# Patient Record
Sex: Male | Born: 1960
Health system: Southern US, Community
[De-identification: ages and names within clinical notes are randomized; demographics above are authoritative.]

## PROBLEM LIST (undated history)

## (undated) DIAGNOSIS — I10 Essential (primary) hypertension: Secondary | ICD-10-CM

## (undated) DIAGNOSIS — IMO0001 Reserved for inherently not codable concepts without codable children: Secondary | ICD-10-CM

## (undated) DIAGNOSIS — J449 Chronic obstructive pulmonary disease, unspecified: Secondary | ICD-10-CM

## (undated) DIAGNOSIS — J9611 Chronic respiratory failure with hypoxia: Secondary | ICD-10-CM

## (undated) DIAGNOSIS — G4730 Sleep apnea, unspecified: Secondary | ICD-10-CM

## (undated) DIAGNOSIS — J40 Bronchitis, not specified as acute or chronic: Secondary | ICD-10-CM

## (undated) DIAGNOSIS — T884XXA Failed or difficult intubation, initial encounter: Secondary | ICD-10-CM

## (undated) DIAGNOSIS — J45909 Unspecified asthma, uncomplicated: Secondary | ICD-10-CM

## (undated) DIAGNOSIS — E119 Type 2 diabetes mellitus without complications: Secondary | ICD-10-CM

## (undated) HISTORY — DX: Sleep apnea, unspecified: G47.30

## (undated) HISTORY — DX: Bronchitis, not specified as acute or chronic: J40

## (undated) HISTORY — DX: Morbid (severe) obesity due to excess calories: E66.01

---

## 1998-04-11 ENCOUNTER — Emergency Department (HOSPITAL_COMMUNITY): Admission: EM | Admit: 1998-04-11 | Discharge: 1998-04-11 | Payer: Self-pay

## 2007-06-27 ENCOUNTER — Emergency Department (HOSPITAL_COMMUNITY): Admission: EM | Admit: 2007-06-27 | Discharge: 2007-06-27 | Payer: Self-pay | Admitting: Emergency Medicine

## 2007-07-10 ENCOUNTER — Emergency Department (HOSPITAL_COMMUNITY): Admission: EM | Admit: 2007-07-10 | Discharge: 2007-07-10 | Payer: Self-pay | Admitting: Emergency Medicine

## 2013-04-19 ENCOUNTER — Encounter (HOSPITAL_COMMUNITY): Payer: Self-pay | Admitting: Emergency Medicine

## 2013-04-19 ENCOUNTER — Emergency Department (HOSPITAL_COMMUNITY): Payer: Self-pay

## 2013-04-19 ENCOUNTER — Emergency Department (HOSPITAL_COMMUNITY)
Admission: EM | Admit: 2013-04-19 | Discharge: 2013-04-19 | Disposition: A | Discharge status: Home / Self Care | Payer: Self-pay | Attending: Emergency Medicine | Admitting: Emergency Medicine

## 2013-04-19 DIAGNOSIS — I10 Essential (primary) hypertension: Secondary | ICD-10-CM | POA: Insufficient documentation

## 2013-04-19 DIAGNOSIS — F17200 Nicotine dependence, unspecified, uncomplicated: Secondary | ICD-10-CM | POA: Insufficient documentation

## 2013-04-19 DIAGNOSIS — E669 Obesity, unspecified: Secondary | ICD-10-CM

## 2013-04-19 DIAGNOSIS — R609 Edema, unspecified: Secondary | ICD-10-CM | POA: Insufficient documentation

## 2013-04-19 DIAGNOSIS — J45901 Unspecified asthma with (acute) exacerbation: Secondary | ICD-10-CM | POA: Insufficient documentation

## 2013-04-19 DIAGNOSIS — Z79899 Other long term (current) drug therapy: Secondary | ICD-10-CM | POA: Insufficient documentation

## 2013-04-19 DIAGNOSIS — Z7982 Long term (current) use of aspirin: Secondary | ICD-10-CM | POA: Insufficient documentation

## 2013-04-19 HISTORY — DX: Unspecified asthma, uncomplicated: J45.909

## 2013-04-19 LAB — PRO B NATRIURETIC PEPTIDE: Pro B Natriuretic peptide (BNP): 172.8 pg/mL — ABNORMAL HIGH (ref 0–125)

## 2013-04-19 LAB — BASIC METABOLIC PANEL
BUN: 16 mg/dL (ref 6–23)
CHLORIDE: 107 meq/L (ref 96–112)
CO2: 28 mEq/L (ref 19–32)
CREATININE: 1.02 mg/dL (ref 0.50–1.35)
Calcium: 9 mg/dL (ref 8.4–10.5)
GFR, EST NON AFRICAN AMERICAN: 83 mL/min — AB (ref >90)
GLUCOSE: 108 mg/dL — AB (ref 70–99)
POTASSIUM: 3.7 meq/L (ref 3.7–5.3)
Sodium: 146 mEq/L (ref 137–147)

## 2013-04-19 LAB — CBC
HEMATOCRIT: 41.3 % (ref 39.0–52.0)
HEMOGLOBIN: 13 g/dL (ref 13.0–17.0)
MCH: 28.3 pg (ref 26.0–34.0)
MCHC: 31.5 g/dL (ref 30.0–36.0)
MCV: 89.8 fL (ref 78.0–100.0)
Platelets: 262 10*3/uL (ref 150–400)
RBC: 4.6 MIL/uL (ref 4.22–5.81)
RDW: 15.4 % (ref 11.5–15.5)
WBC: 7.1 10*3/uL (ref 4.0–10.5)

## 2013-04-19 LAB — I-STAT TROPONIN, ED: Troponin i, poc: 0.02 ng/mL (ref 0.00–0.08)

## 2013-04-19 MED ORDER — ALBUTEROL SULFATE (2.5 MG/3ML) 0.083% IN NEBU: 2.5000 mg | INHALATION_SOLUTION | RESPIRATORY_TRACT | Status: DC | PRN

## 2013-04-19 MED ORDER — HYDROCHLOROTHIAZIDE 25 MG PO TABS: 25.0000 mg | ORAL_TABLET | Freq: Every day | ORAL | Status: DC

## 2013-04-19 NOTE — ED Notes (Signed)
Pt seen at [facility] and sent here for further eval of SOB.  Pt states he's been SOB "for a minute".  States it has gotten worse today.

## 2013-04-19 NOTE — ED Notes (Signed)
Dr. James at bedside  

## 2013-04-19 NOTE — ED Provider Notes (Addendum)
CSN: [id number]     Arrival date & time 04/19/13  4098 History   First MD Initiated Contact with Patient 04/19/13 1010     Chief Complaint  Patient presents with  . Shortness of Breath     HPI  Patient presents emergency room "because the nurse told me to". [facility]. There is a nurse there intermittently. He saw her today. Apparently he walked in and was dyspneic and painting. He is obese. He has dependent edema. He was encouraged to come to the emergency room. He states he is always short of breath he can only walk "to about the parking lot". He denies chest pain previous had edema in his legs for years. He states he doesn't know what his weight is calm he doesn't know if he has high blood pressure diabetes. He denies any formal diagnosis of heart or lung disease. He does smoke daily.  Past Medical History  Diagnosis Date  . Asthma    History reviewed. No pertinent past surgical history. No family history on file. History  Substance Use Topics  . Smoking status: Current Every Day Smoker  . Smokeless tobacco: Not on file  . Alcohol Use: Yes    Review of Systems  Constitutional: Negative for fever, chills, diaphoresis, appetite change and fatigue.  HENT: Negative for mouth sores, sore throat and trouble swallowing.   Eyes: Negative for visual disturbance.  Respiratory: Positive for shortness of breath. Negative for cough, chest tightness and wheezing.   Cardiovascular: Positive for leg swelling. Negative for chest pain.  Gastrointestinal: Negative for nausea, vomiting, abdominal pain, diarrhea and abdominal distention.  Endocrine: Negative for polydipsia, polyphagia and polyuria.  Genitourinary: Negative for dysuria, frequency and hematuria.  Musculoskeletal: Negative for gait problem.  Skin: Negative for color change, pallor and rash.  Neurological: Negative for dizziness, syncope, light-headedness and headaches.  Hematological: Does  not bruise/bleed easily.  Psychiatric/Behavioral: Negative for behavioral problems and confusion.      Allergies  Review of patient's allergies indicates no known allergies.  Home Medications   Current Outpatient Rx  Name  Route  Sig  Dispense  Refill  . aspirin 325 MG tablet   Oral   Take 1,300 mg by mouth daily as needed for mild pain.         . hydrochlorothiazide (HYDRODIURIL) 25 MG tablet   Oral   Take 1 tablet (25 mg total) by mouth daily.   30 tablet   1    BP 130/72  Pulse 72  Temp(Src) 98.1 F (36.7 C) (Oral)  Resp 31  SpO2 96% Physical Exam  Constitutional: He is oriented to person, place, and time. He appears well-developed and well-nourished. No distress.  Morbidly obese. He estimates  his weight at over 400 pounds.  HENT:  Head: Normocephalic.  Eyes: Conjunctivae are normal. Pupils are equal, round, and reactive to light. No scleral icterus.  Neck: Normal range of motion. Neck supple. No thyromegaly present.  Cardiovascular: Normal rate and regular rhythm.  Exam reveals no gallop and no friction rub.   No murmur heard. No S3 or 4 gallop. No murmur  Pulmonary/Chest: Effort normal and breath sounds normal. No respiratory distress. He has no wheezes. He has no rales.  Masses chest the chest wall. Mildly diminished breath sounds. Mild prolongation. No crackles or rales.  Abdominal: Soft. Bowel sounds are normal. He exhibits no distension. There is no tenderness. There is no rebound.  Musculoskeletal: Normal range  of motion.  Neurological: He is alert and oriented to person, place, and time.  Skin: Skin is warm and dry. No rash noted.  2+ symmetric lower extremity edema.  Psychiatric: He has a normal mood and affect. His behavior is normal.    ED Course  Procedures (including critical care time) Labs Review Labs Reviewed  BASIC METABOLIC PANEL - Abnormal; Notable for the following:    Glucose, Bld 108 (*)    GFR calc non Af Amer 83 (*)    All other  components within normal limits  PRO B NATRIURETIC PEPTIDE - Abnormal; Notable for the following:    Pro B Natriuretic peptide (BNP) 172.8 (*)    All other components within normal limits  CBC  I-STAT TROPOININ, ED   Imaging Review Dg Chest 2 View  04/19/2013   CLINICAL DATA:  Shortness of breath and chest pain.  EXAM: CHEST  2 VIEW  COMPARISON:  None.  FINDINGS: The cardiomediastinal silhouette is unremarkable.  This is a mildly low volume film.  There is no evidence of focal airspace disease, pulmonary edema, suspicious pulmonary nodule/mass, pleural effusion, or pneumothorax. No acute bony abnormalities are identified.  IMPRESSION: No evidence of acute cardiopulmonary disease.   Electronically Signed   By: Hassan Rowan M.D.   On: 04/19/2013 12:05     EKG Interpretation   Date/Time:  Wednesday April 19 2013 09:21:52 EDT Ventricular Rate:  102 PR Interval:  160 QRS Duration: 102 QT Interval:  342 QTC Calculation: 445 R Axis:   56 Text Interpretation:  Sinus tachycardia Incomplete right bundle branch  block Borderline ECG Confirmed by Jeneen Rinks  MD, [place] ([id number]) on 04/19/2013  11:34:30 AM      MDM   Final diagnoses:  Dependent edema  Hypertension  Obesity    Clinically as having congestive heart failure. No crackles, no JVD present dependent edema. Pressures are in the 604V systolic here without intervention. Abnormalities on chest x-ray to suggest failure. Plan:   I discussed with him regarding decreasing his caloric intake and starting a moderate exercise program for weight loss. Hydrochlorothiazide his diuretic for his dependent edema.  Stop Smoking    Tanna Furry, MD 04/19/13 1235  Tanna Furry, MD 04/19/13 [phone]

## 2013-04-19 NOTE — Discharge Instructions (Signed)
Body Mass Index (BMI) This BMI is not intended for use with those under 52 years of age, or pregnant women, or lactating women. To estimate BMI, locate your height, then find your weight in this listing. Your BMI is located to the right of your weight. Height: 58 inches  Weight: 91 lb = BMI 19 (normal)  Weight: 96 lb = BMI 20 (normal)  Weight: 100 lb = BMI 21 (normal)  Weight: 105 lb = BMI 22 (normal)  Weight: 110 lb = BMI 23 (normal)  Weight: 115 lb = BMI 24 (normal)  Weight: 119 lb = BMI 25 (overweight)  Weight: 124 lb = BMI 26 (overweight)  Weight: 129 lb = BMI 27 (overweight)  Weight: 134 lb = BMI 28 (overweight)  Weight: 138 lb = BMI 29 (overweight)  Weight: 143 lb = BMI 30 (obese)  Weight: 148 lb = BMI 31 (obese)  Weight: 153 lb = BMI 32 (obese)  Weight: 158 lb = BMI 33 (obese)  Weight: 162 lb = BMI 34 (obese)  Weight: 167 lb = BMI 35 (obese)  Weight: 172 lb = BMI 36 (obese)  Weight: 177 lb = BMI 37 (obese)  Weight: 181 lb = BMI 38 (obese)  Weight: 186 lb = BMI 39 (obese)  Weight: 191 lb = BMI 40 (extreme obesity)  Weight: 196 lb = BMI 41 (extreme obesity)  Weight: 201 lb = BMI 42 (extreme obesity)  Weight: 205 lb = BMI 43 (extreme obesity)  Weight: 210 lb = BMI 44 (extreme obesity)  Weight: 215 lb = BMI 45 (extreme obesity)  Weight: 220 lb = BMI 46 (extreme obesity)  Weight: 224 lb = BMI 47 (extreme obesity)  Weight: 229 lb = BMI 48 (extreme obesity)  Weight: 234 lb = BMI 49 (extreme obesity)  Weight: 239 lb = BMI 50 (extreme obesity)  Weight: 244 lb = BMI 51 (extreme obesity)  Weight: 248 lb = BMI 52 (extreme obesity)  Weight: 253 lb = BMI 53 (extreme obesity)  Weight: 258 lb = BMI 54 (extreme obesity) Height: 59 inches  Weight: 94 lb = BMI 19 (normal)  Weight: 99 lb = BMI 20 (normal)  Weight: 104 lb = BMI 21 (normal)  Weight: 109 lb = BMI 22 (normal)  Weight: 114 lb = BMI 23 (normal)  Weight: 119 lb = BMI 24  (normal)  Weight: 124 lb = BMI 25 (overweight)  Weight: 128 lb = BMI 26 (overweight)  Weight: 133 lb = BMI 27 (overweight)  Weight: 138 lb = BMI 28 (overweight)  Weight: 143 lb = BMI 29 (overweight)  Weight: 148 lb = BMI 30 (obese)  Weight: 153 lb = BMI 31 (obese)  Weight: 158 lb = BMI 32 (obese)  Weight: 163 lb = BMI 33 (obese)  Weight: 168 lb = BMI 34 (obese)  Weight: 173 lb = BMI 35 (obese)  Weight: 178 lb = BMI 36 (obese)  Weight: 183 lb = BMI 37 (obese)  Weight: 188 lb = BMI 38 (obese)  Weight: 193 lb = BMI 39 (obese)  Weight: 198 lb = BMI 40 (extreme obesity)  Weight: 203 lb = BMI 41 (extreme obesity)  Weight: 208 lb = BMI 42 (extreme obesity)  Weight: 212 lb = BMI 43 (extreme obesity)  Weight: 217 lb = BMI 44 (extreme obesity)  Weight: 222 lb = BMI 45 (extreme obesity)  Weight: 227 lb = BMI 46 (extreme obesity)  Weight: 232 lb = BMI 47 (extreme obesity)  Weight: 237  lb = BMI 48 (extreme obesity)  Weight: 242 lb = BMI 49 (extreme obesity)  Weight: 247 lb = BMI 50 (extreme obesity)  Weight: 252 lb = BMI 51 (extreme obesity)  Weight: 257 lb = BMI 52 (extreme obesity)  Weight: 262 lb = BMI 53 (extreme obesity)  Weight: 267 lb = BMI 54 (extreme obesity) Height: 60 inches  Weight: 97 lb = BMI 19 (normal)  Weight: 102 lb = BMI 20 (normal)  Weight: 107 lb = BMI 21 (normal)  Weight: 112 lb = BMI 22 (normal)  Weight: 118 lb = BMI 23 (normal)  Weight: 123 lb = BMI 24 (normal)  Weight: 128 lb = BMI 25 (overweight)  Weight: 133 lb = BMI 26 (overweight)  Weight: 138 lb = BMI 27 (overweight)  Weight: 143 lb = BMI 28 (overweight)  Weight: 148 lb = BMI 29 (overweight)  Weight: 153 lb = BMI 30 (obese)  Weight: 158 lb = BMI 31 (obese)  Weight: 163 lb = BMI 32 (obese)  Weight: 168 lb = BMI 33 (obese)  Weight: 174 lb = BMI 34 (obese)  Weight: 179 lb = BMI 35 (obese)  Weight: 184 lb = BMI 36 (obese)  Weight: 189 lb = BMI 37  (obese)  Weight: 194 lb = BMI 38 (obese)  Weight: 199 lb = BMI 39 (obese)  Weight: 204 lb = BMI 40 (extreme obesity)  Weight: 209 lb = BMI 41 (extreme obesity)  Weight: 215 lb = BMI 42 (extreme obesity)  Weight: 220 lb = BMI 43 (extreme obesity)  Weight: 225 lb = BMI 44 (extreme obesity)  Weight: 230 lb = BMI 45 (extreme obesity)  Weight: 235 lb = BMI 46 (extreme obesity)  Weight: 240 lb = BMI 47 (extreme obesity)  Weight: 245 lb = BMI 48 (extreme obesity)  Weight: 250 lb = BMI 49 (extreme obesity)  Weight: 255 lb = BMI 50 (extreme obesity)  Weight: 261 lb = BMI 51 (extreme obesity)  Weight: 266 lb = BMI 52 (extreme obesity)  Weight: 271 lb = BMI 53 (extreme obesity)  Weight: 276 lb = BMI 54 (extreme obesity) Height: 61 inches  Weight: 100 lb = BMI 19 (normal)  Weight: 106 lb = BMI 20 (normal)  Weight: 111 lb = BMI 21 (normal)  Weight: 116 lb = BMI 22 (normal)  Weight: 122 lb = BMI 23 (normal)  Weight: 127 lb = BMI 24 (normal)  Weight: 132 lb = BMI 25 (overweight)  Weight: 137 lb = BMI 26 (overweight)  Weight: 143 lb = BMI 27 (overweight)  Weight: 148 lb = BMI 28 (overweight)  Weight: 153 lb = BMI 29 (overweight)  Weight: 158 lb = BMI 30 (obese)  Weight: 164 lb = BMI 31 (obese)  Weight: 169 lb = BMI 32 (obese)  Weight: 174 lb = BMI 33 (obese)  Weight: 180 lb = BMI 34 (obese)  Weight: 185 lb = BMI 35 (obese)  Weight: 190 lb = BMI 36 (obese)  Weight: 195 lb = BMI 37 (obese)  Weight: 201 lb = BMI 38 (obese)  Weight: 206 lb = BMI 39 (obese)  Weight: 211 lb = BMI 40 (extreme obesity)  Weight: 217 lb = BMI 41 (extreme obesity)  Weight: 222 lb = BMI 42 (extreme obesity)  Weight: 227 lb = BMI 43 (extreme obesity)  Weight: 232 lb = BMI 44 (extreme obesity)  Weight: 238 lb = BMI 45 (extreme obesity)  Weight: 243 lb = BMI 46 (extreme  obesity)  Weight: 248 lb = BMI 47 (extreme obesity)  Weight: 254 lb = BMI 48 (extreme  obesity)  Weight: 259 lb = BMI 49 (extreme obesity)  Weight: 264 lb = BMI 50 (extreme obesity)  Weight: 269 lb = BMI 51 (extreme obesity)  Weight: 275 lb = BMI 52 (extreme obesity)  Weight: 280 lb = BMI 53 (extreme obesity)  Weight: 285 lb = BMI 54 (extreme obesity) Height: 62 inches  Weight: 104 lb = BMI 19 (normal)  Weight: 109 lb = BMI 20 (normal)  Weight: 115 lb = BMI 21 (normal)  Weight: 120 lb = BMI 22 (normal)  Weight: 126 lb = BMI 23 (normal)  Weight: 131 lb = BMI 24 (normal)  Weight: 136 lb = BMI 25 (overweight)  Weight: 142 lb = BMI 26 (overweight)  Weight: 147 lb = BMI 27 (overweight)  Weight: 153 lb = BMI 28 (overweight)  Weight: 158 lb = BMI 29 (overweight)  Weight: 164 lb = BMI 30 (obese)  Weight: 169 lb = BMI 31 (obese)  Weight: 175 lb = BMI 32 (obese)  Weight: 180 lb = BMI 33 (obese)  Weight: 186 lb = BMI 34 (obese)  Weight: 191 lb = BMI 35 (obese)  Weight: 196 lb = BMI 36 (obese)  Weight: 202 lb = BMI 37 (obese)  Weight: 207 lb = BMI 38 (obese)  Weight: 213 lb = BMI 39 (obese)  Weight: 218 lb = BMI 40 (extreme obesity)  Weight: 224 lb = BMI 41 (extreme obesity)  Weight: 229 lb = BMI 42 (extreme obesity)  Weight: 235 lb = BMI 43 (extreme obesity)  Weight: 240 lb = BMI 44 (extreme obesity)  Weight: 246 lb = BMI 45 (extreme obesity)  Weight: 251 lb = BMI 46 (extreme obesity)  Weight: 256 lb = BMI 47 (extreme obesity)  Weight: 262 lb = BMI 48 (extreme obesity)  Weight: 267 lb = BMI 49 (extreme obesity)  Weight: 273 lb = BMI 50 (extreme obesity)  Weight: 278 lb = BMI 51 (extreme obesity)  Weight: 284 lb = BMI 52 (extreme obesity)  Weight: 289 lb = BMI 53 (extreme obesity)  Weight: 295 lb = BMI 54 (extreme obesity) Height: 63 inches  Weight: 107 lb = BMI 19 (normal)  Weight: 113 lb = BMI 20 (normal)  Weight: 118 lb = BMI 21 (normal)  Weight: 124 lb = BMI 22 (normal)  Weight: 130 lb = BMI 23 (normal)  Weight:  135 lb = BMI 24 (normal)  Weight: 141 lb = BMI 25 (overweight)  Weight: 146 lb = BMI 26 (overweight)  Weight: 152 lb = BMI 27 (overweight)  Weight: 158 lb = BMI 28 (overweight)  Weight: 163 lb = BMI 29 (overweight)  Weight: 169 lb = BMI 30 (obese)  Weight: 175 lb = BMI 31 (obese)  Weight: 180 lb = BMI 32 (obese)  Weight: 186 lb = BMI 33 (obese)  Weight: 191 lb = BMI 34 (obese)  Weight: 197 lb = BMI 35 (obese)  Weight: 203 lb = BMI 36 (obese)  Weight: 208 lb = BMI 37 (obese)  Weight: 214 lb = BMI 38 (obese)  Weight: 220 lb = BMI 39 (obese)  Weight: 225 lb = BMI 40 (extreme obesity)  Weight: 231 lb = BMI 41 (extreme obesity)  Weight: 237 lb = BMI 42 (extreme obesity)  Weight: 242 lb = BMI 43 (extreme obesity)  Weight: 248 lb = BMI 44 (extreme obesity)  Weight: 254  lb = BMI 45 (extreme obesity)  Weight: 259 lb = BMI 46 (extreme obesity)  Weight: 265 lb = BMI 47 (extreme obesity)  Weight: 270 lb = BMI 48 (extreme obesity)  Weight: 278 lb = BMI 49 (extreme obesity)  Weight: 282 lb = BMI 50 (extreme obesity)  Weight: 287 lb = BMI 51 (extreme obesity)  Weight: 293 lb = BMI 52 (extreme obesity)  Weight: 299 lb = BMI 53 (extreme obesity)  Weight: 304 lb = BMI 54 (extreme obesity) Height: 64 inches  Weight: 110 lb = BMI 19 (normal)  Weight: 116 lb = BMI 20 (normal)  Weight: 122 lb = BMI 21 (normal)  Weight: 128 lb = BMI 22 (normal)  Weight: 134 lb = BMI 23 (normal)  Weight: 140 lb = BMI 24 (normal)  Weight: 145 lb = BMI 25 (overweight)  Weight: 151 lb = BMI 26 (overweight)  Weight: 157 lb = BMI 27 (overweight)  Weight: 163 lb = BMI 28 (overweight)  Weight: 169 lb = BMI 29 (overweight)  Weight: 174 lb = BMI 30 (obese)  Weight: 180 lb = BMI 31 (obese)  Weight: 186 lb = BMI 32 (obese)  Weight: 192 lb = BMI 33 (obese)  Weight: 197 lb = BMI 34 (obese)  Weight: 204 lb = BMI 35 (obese)  Weight: 209 lb = BMI 36 (obese)  Weight: 215 lb =  BMI 37 (obese)  Weight: 221 lb = BMI 38 (obese)  Weight: 227 lb = BMI 39 (obese)  Weight: 232 lb = BMI 40 (extreme obesity)  Weight: 238 lb = BMI 41 (extreme obesity)  Weight: 244 lb = BMI 42 (extreme obesity)  Weight: 250 lb = BMI 43 (extreme obesity)  Weight: 256 lb = BMI 44 (extreme obesity)  Weight: 262 lb = BMI 45 (extreme obesity)  Weight: 267 lb = BMI 46 (extreme obesity)  Weight: 273 lb = BMI 47 (extreme obesity)  Weight: 279 lb = BMI 48 (extreme obesity)  Weight: 285 lb = BMI 49 (extreme obesity)  Weight: 291 lb = BMI 50 (extreme obesity)  Weight: 296 lb = BMI 51 (extreme obesity)  Weight: 302 lb = BMI 52 (extreme obesity)  Weight: 308 lb = BMI 53 (extreme obesity)  Weight: 314 lb = BMI 54 (extreme obesity) Height: 65 inches  Weight: 114 lb = BMI 19 (normal)  Weight: 120 lb = BMI 20 (normal)  Weight: 126 lb = BMI 21 (normal)  Weight: 132 lb = BMI 22 (normal)  Weight: 138 lb = BMI 23 (normal)  Weight: 144 lb = BMI 24 (normal)  Weight: 150 lb = BMI 25 (overweight)  Weight: 156 lb = BMI 26 (overweight)  Weight: 162 lb = BMI 27 (overweight)  Weight: 168 lb = BMI 28 (overweight)  Weight: 174 lb = BMI 29 (overweight)  Weight: 180 lb = BMI 30 (obese)  Weight: 186 lb = BMI 31 (obese)  Weight: 192 lb = BMI 32 (obese)  Weight: 198 lb = BMI 33 (obese)  Weight: 204 lb = BMI 34 (obese)  Weight: 210 lb = BMI 35 (obese)  Weight: 216 lb = BMI 36 (obese)  Weight: 222 lb = BMI 37 (obese)  Weight: 228 lb = BMI 38 (obese)  Weight: 234 lb = BMI 39 (obese)  Weight: 240 lb = BMI 40 (extreme obesity)  Weight: 246 lb = BMI 41 (extreme obesity)  Weight: 252 lb = BMI 42 (extreme obesity)  Weight: 258 lb = BMI 43 (extreme  obesity)  Weight: 264 lb = BMI 44 (extreme obesity)  Weight: 270 lb = BMI 45 (extreme obesity)  Weight: 276 lb = BMI 46 (extreme obesity)  Weight: 282 lb = BMI 47 (extreme obesity)  Weight: 288 lb = BMI 48 (extreme  obesity)  Weight: 294 lb = BMI 49 (extreme obesity)  Weight: 300 lb = BMI 50 (extreme obesity)  Weight: 306 lb = BMI 51 (extreme obesity)  Weight: 312 lb = BMI 52 (extreme obesity)  Weight: 318 lb = BMI 53 (extreme obesity)  Weight: 324 lb = BMI 54 (extreme obesity) Height: 66 inches  Weight: 118 lb = BMI 19 (normal)  Weight: 124 lb = BMI 20 (normal)  Weight: 130 lb = BMI 21 (normal)  Weight: 136 lb = BMI 22 (normal)  Weight: 142 lb = BMI 23 (normal)  Weight: 148 lb = BMI 24 (normal)  Weight: 155 lb = BMI 25 (overweight)  Weight: 161 lb = BMI 26 (overweight)  Weight: 167 lb = BMI 27 (overweight)  Weight: 173 lb = BMI 28 (overweight)  Weight: 179 lb = BMI 29 (overweight)  Weight: 186 lb = BMI 30 (obese)  Weight: 192 lb = BMI 31 (obese)  Weight: 198 lb = BMI 32 (obese)  Weight: 204 lb = BMI 33 (obese)  Weight: 210 lb = BMI 34 (obese)  Weight: 216 lb = BMI 35 (obese)  Weight: 223 lb = BMI 36 (obese)  Weight: 229 lb = BMI 37 (obese)  Weight: 235 lb = BMI 38 (obese)  Weight: 241 lb = BMI 39 (obese)  Weight: 247 lb = BMI 40 (extreme obesity)  Weight: 253 lb = BMI 41 (extreme obesity)  Weight: 260 lb = BMI 42 (extreme obesity)  Weight: 266 lb = BMI 43 (extreme obesity)  Weight: 272 lb = BMI 44 (extreme obesity)  Weight: 278 lb = BMI 45 (extreme obesity)  Weight: 284 lb = BMI 46 (extreme obesity)  Weight: 291 lb = BMI 47 (extreme obesity)  Weight: 297 lb = BMI 48 (extreme obesity)  Weight: 303 lb = BMI 49 (extreme obesity)  Weight: 309 lb = BMI 50 (extreme obesity)  Weight: 315 lb = BMI 51 (extreme obesity)  Weight: 322 lb = BMI 52 (extreme obesity)  Weight: 328 lb = BMI 53 (extreme obesity)  Weight: 334 lb = BMI 54 (extreme obesity) Height: 67 inches  Weight: 121 lb = BMI 19 (normal)  Weight: 127 lb = BMI 20 (normal)  Weight: 134 lb = BMI 21 (normal)  Weight: 140 lb = BMI 22 (normal)  Weight: 146 lb = BMI 23 (normal)  Weight:  153 lb = BMI 24 (normal)  Weight: 159 lb = BMI 25 (overweight)  Weight: 166 lb = BMI 26 (overweight)  Weight: 172 lb = BMI 27 (overweight)  Weight: 178 lb = BMI 28 (overweight)  Weight: 185 lb = BMI 29 (overweight)  Weight: 191 lb = BMI 30 (obese)  Weight: 198 lb = BMI 31 (obese)  Weight: 204 lb = BMI 32 (obese)  Weight: 211 lb = BMI 33 (obese)  Weight: 217 lb = BMI 34 (obese)  Weight: 223 lb = BMI 35 (obese)  Weight: 230 lb = BMI 36 (obese)  Weight: 236 lb = BMI 37 (obese)  Weight: 242 lb = BMI 38 (obese)  Weight: 249 lb = BMI 39 (obese)  Weight: 255 lb = BMI 40 (extreme obesity)  Weight: 261 lb = BMI 41 (extreme obesity)  Weight: 268  lb = BMI 42 (extreme obesity)  Weight: 274 lb = BMI 43 (extreme obesity)  Weight: 280 lb = BMI 44 (extreme obesity)  Weight: 287 lb = BMI 45 (extreme obesity)  Weight: 293 lb = BMI 46 (extreme obesity)  Weight: 299 lb = BMI 47 (extreme obesity)  Weight: 306 lb = BMI 48 (extreme obesity)  Weight: 312 lb = BMI 49 (extreme obesity)  Weight: 319 lb = BMI 50 (extreme obesity)  Weight: 325 lb = BMI 51 (extreme obesity)  Weight: 331 lb = BMI 52 (extreme obesity)  Weight: 338 lb = BMI 53 (extreme obesity)  Weight: 344 lb = BMI 54 (extreme obesity) Height: 68 inches  Weight: 125 lb = BMI 19 (normal)  Weight: 131 lb = BMI 20 (normal)  Weight: 138 lb = BMI 21 (normal)  Weight: 144 lb = BMI 22 (normal)  Weight: 151 lb = BMI 23 (normal)  Weight: 158 lb = BMI 24 (normal)  Weight: 164 lb = BMI 25 (overweight)  Weight: 171 lb = BMI 26 (overweight)  Weight: 177 lb = BMI 27 (overweight)  Weight: 184 lb = BMI 28 (overweight)  Weight: 190 lb = BMI 29 (overweight)  Weight: 197 lb = BMI 30 (obese)  Weight: 203 lb = BMI 31 (obese)  Weight: 210 lb = BMI 32 (obese)  Weight: 216 lb = BMI 33 (obese)  Weight: 223 lb = BMI 34 (obese)  Weight: 230 lb = BMI 35 (obese)  Weight: 236 lb = BMI 36 (obese)  Weight: 243 lb =  BMI 37 (obese)  Weight: 249 lb = BMI 38 (obese)  Weight: 256 lb = BMI 39 (obese)  Weight: 262 lb = BMI 40 (extreme obesity)  Weight: 269 lb = BMI 41 (extreme obesity)  Weight: 276 lb = BMI 42 (extreme obesity)  Weight: 282 lb = BMI 43 (extreme obesity)  Weight: 289 lb = BMI 44 (extreme obesity)  Weight: 295 lb = BMI 45 (extreme obesity)  Weight: 302 lb = BMI 46 (extreme obesity)  Weight: 308 lb = BMI 47 (extreme obesity)  Weight: 315 lb = BMI 48 (extreme obesity)  Weight: 322 lb = BMI 49 (extreme obesity)  Weight: 328 lb = BMI 50 (extreme obesity)  Weight: 335 lb = BMI 51 (extreme obesity)  Weight: 341 lb = BMI 52 (extreme obesity)  Weight: 348 lb = BMI 53 (extreme obesity)  Weight: 354 lb = BMI 54 (extreme obesity) Height: 69 inches  Weight: 128 lb = BMI 19 (normal)  Weight: 135 lb = BMI 20 (normal)  Weight: 142 lb = BMI 21 (normal)  Weight: 149 lb = BMI 22 (normal)  Weight: 155 lb = BMI 23 (normal)  Weight: 162 lb = BMI 24 (normal)  Weight: 169 lb = BMI 25 (overweight)  Weight: 176 lb = BMI 26 (overweight)  Weight: 182 lb = BMI 27 (overweight)  Weight: 189 lb = BMI 28 (overweight)  Weight: 196 lb = BMI 29 (overweight)  Weight: 203 lb = BMI 30 (obese)  Weight: 209 lb = BMI 31 (obese)  Weight: 216 lb = BMI 32 (obese)  Weight: 223 lb = BMI 33 (obese)  Weight: 230 lb = BMI 34 (obese)  Weight: 236 lb = BMI 35 (obese)  Weight: 243 lb = BMI 36 (obese)  Weight: 250 lb = BMI 37 (obese)  Weight: 257 lb = BMI 38 (obese)  Weight: 263 lb = BMI 39 (obese)  Weight: 270 lb = BMI 40 (extreme  obesity)  Weight: 277 lb = BMI 41 (extreme obesity)  Weight: 284 lb = BMI 42 (extreme obesity)  Weight: 291 lb = BMI 43 (extreme obesity)  Weight: 297 lb = BMI 44 (extreme obesity)  Weight: 304 lb = BMI 45 (extreme obesity)  Weight: 311 lb = BMI 46 (extreme obesity)  Weight: 318 lb = BMI 47 (extreme obesity)  Weight: 324 lb = BMI 48 (extreme  obesity)  Weight: 331 lb = BMI 49 (extreme obesity)  Weight: 338 lb = BMI 50 (extreme obesity)  Weight: 345 lb = BMI 51 (extreme obesity)  Weight: 351 lb = BMI 52 (extreme obesity)  Weight: 358 lb = BMI 53 (extreme obesity)  Weight: 365 lb = BMI 54 (extreme obesity) Height: 70 inches  Weight: 132 lb = BMI 19 (normal)  Weight: 139 lb = BMI 20 (normal)  Weight: 146 lb = BMI 21 (normal)  Weight: 153 lb = BMI 22 (normal)  Weight: 160 lb = BMI 23 (normal)  Weight: 167 lb = BMI 24 (normal)  Weight: 174 lb = BMI 25 (overweight)  Weight: 181 lb = BMI 26 (overweight)  Weight: 188 lb = BMI 27 (overweight)  Weight: 195 lb = BMI 28 (overweight)  Weight: 202 lb = BMI 29 (overweight)  Weight: 209 lb = BMI 30 (obese)  Weight: 216 lb = BMI 31 (obese)  Weight: 222 lb = BMI 32 (obese)  Weight: 229 lb = BMI 33 (obese)  Weight: 236 lb = BMI 34 (obese)  Weight: 243 lb = BMI 35 (obese)  Weight: 250 lb = BMI 36 (obese)  Weight: 257 lb = BMI 37 (obese)  Weight: 264 lb = BMI 38 (obese)  Weight: 271 lb = BMI 39 (obese)  Weight: 278 lb = BMI 40 (extreme obesity)  Weight: 285 lb = BMI 41 (extreme obesity)  Weight: 292 lb = BMI 42 (extreme obesity)  Weight: 299 lb = BMI 43 (extreme obesity)  Weight: 306 lb = BMI 44 (extreme obesity)  Weight: 313 lb = BMI 45 (extreme obesity)  Weight: 320 lb = BMI 46 (extreme obesity)  Weight: 327 lb = BMI 47 (extreme obesity)  Weight: 334 lb = BMI 48 (extreme obesity)  Weight: 341 lb = BMI 49 (extreme obesity)  Weight: 348 lb = BMI 50 (extreme obesity)  Weight: 355 lb = BMI 51 (extreme obesity)  Weight: 362 lb = BMI 52 (extreme obesity)  Weight: 369 lb = BMI 53 (extreme obesity)  Weight: 376 lb = BMI 54 (extreme obesity) Height: 71 inches  Weight: 136 lb = BMI 19 (normal)  Weight: 143 lb = BMI 20 (normal)  Weight: 150 lb = BMI 21 (normal)  Weight: 157 lb = BMI 22 (normal)  Weight: 165 lb = BMI 23 (normal)  Weight:  172 lb = BMI 24 (normal)  Weight: 179 lb = BMI 25 (overweight)  Weight: 186 lb = BMI 26 (overweight)  Weight: 193 lb = BMI 27 (overweight)  Weight: 200 lb = BMI 28 (overweight)  Weight: 208 lb = BMI 29 (overweight)  Weight: 215 lb = BMI 30 (obese)  Weight: 222 lb = BMI 31 (obese)  Weight: 229 lb = BMI 32 (obese)  Weight: 236 lb = BMI 33 (obese)  Weight: 243 lb = BMI 34 (obese)  Weight: 250 lb = BMI 35 (obese)  Weight: 257 lb = BMI 36 (obese)  Weight: 265 lb = BMI 37 (obese)  Weight: 272 lb = BMI 38 (obese)  Weight:  279 lb = BMI 39 (obese)  Weight: 286 lb = BMI 40 (extreme obesity)  Weight: 293 lb = BMI 41 (extreme obesity)  Weight: 301 lb = BMI 42 (extreme obesity)  Weight: 308 lb = BMI 43 (extreme obesity)  Weight: 315 lb = BMI 44 (extreme obesity)  Weight: 322 lb = BMI 45 (extreme obesity)  Weight: 329 lb = BMI 46 (extreme obesity)  Weight: 338 lb = BMI 47 (extreme obesity)  Weight: 343 lb = BMI 48 (extreme obesity)  Weight: 351 lb = BMI 49 (extreme obesity)  Weight: 358 lb = BMI 50 (extreme obesity)  Weight: 365 lb = BMI 51 (extreme obesity)  Weight: 372 lb = BMI 52 (extreme obesity)  Weight: 379 lb = BMI 53 (extreme obesity)  Weight: 386 lb = BMI 54 (extreme obesity) Height: 72 inches  Weight: 140 lb = BMI 19 (normal)  Weight: 147 lb = BMI 20 (normal)  Weight: 154 lb = BMI 21 (normal)  Weight: 162 lb = BMI 22 (normal)  Weight: 169 lb = BMI 23 (normal)  Weight: 177 lb = BMI 24 (normal)  Weight: 184 lb = BMI 25 (overweight)  Weight: 191 lb = BMI 26 (overweight)  Weight: 199 lb = BMI 27 (overweight)  Weight: 206 lb = BMI 28 (overweight)  Weight: 213 lb = BMI 29 (overweight)  Weight: 221 lb = BMI 30 (obese)  Weight: 228 lb = BMI 31 (obese)  Weight: 235 lb = BMI 32 (obese)  Weight: 242 lb = BMI 33 (obese)  Weight: 250 lb = BMI 34 (obese)  Weight: 258 lb = BMI 35 (obese)  Weight: 265 lb = BMI 36 (obese)  Weight: 272 lb =  BMI 37 (obese)  Weight: 279 lb = BMI 38 (obese)  Weight: 287 lb = BMI 39 (obese)  Weight: 294 lb = BMI 40 (extreme obesity)  Weight: 302 lb = BMI 41 (extreme obesity)  Weight: 309 lb = BMI 42 (extreme obesity)  Weight: 316 lb = BMI 43 (extreme obesity)  Weight: 324 lb = BMI 44 (extreme obesity)  Weight: 331 lb = BMI 45 (extreme obesity)  Weight: 338 lb = BMI 46 (extreme obesity)  Weight: 346 lb = BMI 47 (extreme obesity)  Weight: 353 lb = BMI 48 (extreme obesity)  Weight: 361 lb = BMI 49 (extreme obesity)  Weight: 368 lb = BMI 50 (extreme obesity)  Weight: 375 lb = BMI 51 (extreme obesity)  Weight: 383 lb = BMI 52 (extreme obesity)  Weight: 390 lb = BMI 53 (extreme obesity)  Weight: 397 lb = BMI 54 (extreme obesity) Height: 73 inches  Weight: 144 lb = BMI 19 (normal)  Weight: 151 lb = BMI 20 (normal)  Weight: 159 lb = BMI 21 (normal)  Weight: 166 lb = BMI 22 (normal)  Weight: 174 lb = BMI 23 (normal)  Weight: 182 lb = BMI 24 (normal)  Weight: 189 lb = BMI 25 (overweight)  Weight: 197 lb = BMI 26 (overweight)  Weight: 204 lb = BMI 27 (overweight)  Weight: 212 lb = BMI 28 (overweight)  Weight: 219 lb = BMI 29 (overweight)  Weight: 227 lb = BMI 30 (obese)  Weight: 235 lb = BMI 31 (obese)  Weight: 242 lb = BMI 32 (obese)  Weight: 250 lb = BMI 33 (obese)  Weight: 257 lb = BMI 34 (obese)  Weight: 265 lb = BMI 35 (obese)  Weight: 272 lb = BMI 36 (obese)  Weight: 280 lb =  BMI 37 (obese)  Weight: 288 lb = BMI 38 (obese)  Weight: 295 lb = BMI 39 (obese)  Weight: 302 lb = BMI 40 (extreme obesity)  Weight: 310 lb = BMI 41 (extreme obesity)  Weight: 318 lb = BMI 42 (extreme obesity)  Weight: 325 lb = BMI 43 (extreme obesity)  Weight: 333 lb = BMI 44 (extreme obesity)  Weight: 340 lb = BMI 45 (extreme obesity)  Weight: 348 lb = BMI 46 (extreme obesity)  Weight: 355 lb = BMI 47 (extreme obesity)  Weight: 363 lb = BMI 48 (extreme  obesity)  Weight: 371 lb = BMI 49 (extreme obesity)  Weight: 378 lb = BMI 50 (extreme obesity)  Weight: 386 lb = BMI 51 (extreme obesity)  Weight: 393 lb = BMI 52 (extreme obesity)  Weight: 401 lb = BMI 53 (extreme obesity)  Weight: 408 lb = BMI 54 (extreme obesity) Height: 74 inches  Weight: 148 lb = BMI 19 (normal)  Weight: 155 lb = BMI 20 (normal)  Weight: 163 lb = BMI 21 (normal)  Weight: 171 lb = BMI 22 (normal)  Weight: 179 lb = BMI 23 (normal)  Weight: 186 lb = BMI 24 (normal)  Weight: 194 lb = BMI 25 (overweight)  Weight: 202 lb = BMI 26 (overweight)  Weight: 210 lb = BMI 27 (overweight)  Weight: 218 lb = BMI 28 (overweight)  Weight: 225 lb = BMI 29 (overweight)  Weight: 233 lb = BMI 30 (obese)  Weight: 241 lb = BMI 31 (obese)  Weight: 249 lb = BMI 32 (obese)  Weight: 256 lb = BMI 33 (obese)  Weight: 264 lb = BMI 34 (obese)  Weight: 272 lb = BMI 35 (obese)  Weight: 280 lb = BMI 36 (obese)  Weight: 287 lb = BMI 37 (obese)  Weight: 295 lb = BMI 38 (obese)  Weight: 303 lb = BMI 39 (obese)  Weight: 311 lb = BMI 40 (extreme obesity)  Weight: 319 lb = BMI 41 (extreme obesity)  Weight: 326 lb = BMI 42 (extreme obesity)  Weight: 334 lb = BMI 43 (extreme obesity)  Weight: 342 lb = BMI 44 (extreme obesity)  Weight: 350 lb = BMI 45 (extreme obesity)  Weight: 358 lb = BMI 46 (extreme obesity)  Weight: 365 lb = BMI 47 (extreme obesity)  Weight: 373 lb = BMI 48 (extreme obesity)  Weight: 381 lb = BMI 49 (extreme obesity)  Weight: 389 lb = BMI 50 (extreme obesity)  Weight: 396 lb = BMI 51 (extreme obesity)  Weight: 404 lb = BMI 52 (extreme obesity)  Weight: 412 lb = BMI 53 (extreme obesity)  Weight: 420 lb = BMI 54 (extreme obesity) Height: 75 inches  Weight: 152 lb = BMI 19 (normal)  Weight: 160 lb = BMI 20 (normal)  Weight: 168 lb = BMI 21 (normal)  Weight: 176 lb = BMI 22 (normal)  Weight: 184 lb = BMI 23 (normal)  Weight:  192 lb = BMI 24 (normal)  Weight: 200 lb = BMI 25 (overweight)  Weight: 208 lb = BMI 26 (overweight)  Weight: 216 lb = BMI 27 (overweight)  Weight: 224 lb = BMI 28 (overweight)  Weight: 232 lb = BMI 29 (overweight)  Weight: 240 lb = BMI 30 (obese)  Weight: 248 lb = BMI 31 (obese)  Weight: 256 lb = BMI 32 (obese)  Weight: 264 lb = BMI 33 (obese)  Weight: 272 lb = BMI 34 (obese)  Weight: 279 lb = BMI 35 (  obese)  Weight: 287 lb = BMI 36 (obese)  Weight: 295 lb = BMI 37 (obese)  Weight: 303 lb = BMI 38 (obese)  Weight: 311 lb = BMI 39 (obese)  Weight: 319 lb = BMI 40 (extreme obesity)  Weight: 327 lb = BMI 41 (extreme obesity)  Weight: 335 lb = BMI 42 (extreme obesity)  Weight: 343 lb = BMI 43 (extreme obesity)  Weight: 351 lb = BMI 44 (extreme obesity)  Weight: 359 lb = BMI 45 (extreme obesity)  Weight: 367 lb = BMI 46 (extreme obesity)  Weight: 375 lb = BMI 47 (extreme obesity)  Weight: 383 lb = BMI 48 (extreme obesity)  Weight: 391 lb = BMI 49 (extreme obesity)  Weight: 399 lb = BMI 50 (extreme obesity)  Weight: 407 lb = BMI 51 (extreme obesity)  Weight: 415 lb = BMI 52 (extreme obesity)  Weight: 423 lb = BMI 53 (extreme obesity)  Weight: 431 lb = BMI 54 (extreme obesity) Height: 76 inches  Weight: 156 lb = BMI 19 (normal)  Weight: 164 lb = BMI 20 (normal)  Weight: 172 lb = BMI 21 (normal)  Weight: 180 lb = BMI 22 (normal)  Weight: 189 lb = BMI 23 (normal)  Weight: 197 lb = BMI 24 (normal)  Weight: 205 lb = BMI 25 (overweight)  Weight: 213 lb = BMI 26 (overweight)  Weight: 221 lb = BMI 27 (overweight)  Weight: 230 lb = BMI 28 (overweight)  Weight: 238 lb = BMI 29 (overweight)  Weight: 246 lb = BMI 30 (obese)  Weight: 254 lb = BMI 31 (obese)  Weight: 263 lb = BMI 32 (obese)  Weight: 271 lb = BMI 33 (obese)  Weight: 279 lb = BMI 34 (obese)  Weight: 287 lb = BMI 35 (obese)  Weight: 295 lb = BMI 36 (obese)  Weight: 304 lb =  BMI 37 (obese)  Weight: 312 lb = BMI 38 (obese)  Weight: 320 lb = BMI 39 (obese)  Weight: 328 lb = BMI 40 (extreme obesity)  Weight: 336 lb = BMI 41 (extreme obesity)  Weight: 344 lb = BMI 42 (extreme obesity)  Weight: 353 lb = BMI 43 (extreme obesity)  Weight: 361 lb = BMI 44 (extreme obesity)  Weight: 369 lb = BMI 45 (extreme obesity)  Weight: 377 lb = BMI 46 (extreme obesity)  Weight: 385 lb = BMI 47 (extreme obesity)  Weight: 394 lb = BMI 48 (extreme obesity)  Weight: 402 lb = BMI 49 (extreme obesity)  Weight: 410 lb = BMI 50 (extreme obesity)  Weight: 418 lb = BMI 51 (extreme obesity)  Weight: 426 lb = BMI 52 (extreme obesity)  Weight: 435 lb = BMI 53 (extreme obesity)  Weight: 443 lb = BMI 54 (extreme obesity) Source: Adapted from Clinical Guidelines on the Identification, Evaluation, and Treatment of Overweight and Obesity in Adults: The Evidence Report. HEALTH RISK CLASSIFICATION ACCORDING TO BODY MASS INDEX (BMI) Classification: Underweight.  BMI Category:  less than 18.5  Risk of developing health problems: Increased. Classification: Normal Weight.  BMI Category: 18.5 to 24.9  Risk of developing health problems: Least. Classification: Overweight.  BMI Category: 25.0 to 29.9  Risk of developing health problems: Increased. Classification: Obese class.  BMI Category:  30.0 to 34.9  Risk of developing health problems: High. Classification: Obese class II.  BMI Category:  35.0 to 39.9  Risk of developing health problems: Very high. Classification: Obese class III.  BMI Category: 40.0 or more  Risk of  developing health problems: Extremely high. Note: For persons 41 years and older the 'normal' range may begin slightly above BMI 18.5 and extend into the 'overweight' range.   To clarify risk for each individual, other factors also need to be considered, such as:  Lifestyle habits.  Fitness level.  Presence or absence of other health risk  conditions.  The classification system may underestimate or overestimate health risks in certain adults, such as:  Highly muscular adults. Very muscular adults, such as athletes, may have a low percentage of body fat but a large amount of muscle tissue. This can result in a BMI in the overweight range that may over estimate the risk of developing health problems.  Adults who naturally have a very lean body build.  Young adults who have not reached full growth.  Adults over 24 years of age. For adults over age 26, more research is needed to determine if the cut-off points for the 'normal weight' range differ in any way from those for younger adults.  It is also important to note that BMI is only one part of a health risk assessment. To further clarify risk, other factors need to be considered as well.  Age, inherited traits, presence or absence of other conditions such as diabetes, high blood lipids, hypertension, and high blood glucose levels also influence the development of diseases associated with overweight. Risk factors such as poor eating habits, physical inactivity, and tobacco use can play a role in the development of diseases associated with both overweight and underweight. Consult a caregiver for a more complete assessment of your weight as it relates to health risk. It is important to discuss with your caregiver what BMI means for you as an individual. Maintaining a 'normal weight' is one element of good health. However, unhealthy eating habits, low levels of physical activity and tobacco use will increase the risk of health problems even for those within the normal weight range.  Being overweight indicates some risk to health. But research suggests that regular physical activity can decrease the risk of several health problems. Equally, a nutritious diet has been shown to decrease some of the risks associated with overweight. It is important to emphasize that a weight classification  system is but one tool to assess health risks in individuals.  Document Released: 09/17/2003 Document Revised: 03/30/2011 Document Reviewed: 10/15/2004 [facility] Patient Information 2014 [place].  Edema Edema is an abnormal build-up of fluids in tissues. Because this is partly dependent on gravity (water flows to the lowest place), it is more common in the legs and thighs (lower extremities). It is also common in the looser tissues, like around the eyes. Painless swelling of the feet and ankles is common and increases as a person ages. It may affect both legs and may include the calves or even thighs. When squeezed, the fluid may move out of the affected area and may leave a dent for a few moments. CAUSES   Prolonged standing or sitting in one place for extended periods of time. Movement helps pump tissue fluid into the veins, and absence of movement prevents this, resulting in edema.  Varicose veins. The valves in the veins do not work as well as they should. This causes fluid to leak into the tissues.  Fluid and salt overload.  Injury, burn, or surgery to the leg, ankle, or foot, may damage veins and allow fluid to leak out.  Sunburn damages vessels. Leaky vessels allow fluid to go out into the sunburned tissues.  Allergies (from insect bites or stings, medications or chemicals) cause swelling by allowing vessels to become leaky.  Protein in the blood helps keep fluid in your vessels. Low protein, as in malnutrition, allows fluid to leak out.  Hormonal changes, including pregnancy and menstruation, cause fluid retention. This fluid may leak out of vessels and cause edema.  Medications that cause fluid retention. Examples are sex hormones, blood pressure medications, steroid treatment, or anti-depressants.  Some illnesses cause edema, especially heart failure, kidney disease, or liver disease.  Surgery that cuts veins or lymph nodes, such as surgery done for the heart or for  breast cancer, may result in edema. DIAGNOSIS  Your caregiver is usually easily able to determine what is causing your swelling (edema) by simply asking what is wrong (getting a history) and examining you (doing a physical). Sometimes x-rays, EKG (electrocardiogram or heart tracing), and blood work may be done to evaluate for underlying medical illness. TREATMENT  General treatment includes:  Leg elevation (or elevation of the affected body part).  Restriction of fluid intake.  Prevention of fluid overload.  Compression of the affected body part. Compression with elastic bandages or support stockings squeezes the tissues, preventing fluid from entering and forcing it back into the blood vessels.  Diuretics (also called water pills or fluid pills) pull fluid out of your body in the form of increased urination. These are effective in reducing the swelling, but can have side effects and must be used only under your caregiver's supervision. Diuretics are appropriate only for some types of edema. The specific treatment can be directed at any underlying causes discovered. Heart, liver, or kidney disease should be treated appropriately. HOME CARE INSTRUCTIONS   Elevate the legs (or affected body part) above the level of the heart, while lying down.  Avoid sitting or standing still for prolonged periods of time.  Avoid putting anything directly under the knees when lying down, and do not wear constricting clothing or garters on the upper legs.  Exercising the legs causes the fluid to work back into the veins and lymphatic channels. This may help the swelling go down.  The pressure applied by elastic bandages or support stockings can help reduce ankle swelling.  A low-salt diet may help reduce fluid retention and decrease the ankle swelling.  Take any medications exactly as prescribed. SEEK MEDICAL CARE IF:  Your edema is not responding to recommended treatments. SEEK IMMEDIATE MEDICAL CARE  IF:   You develop shortness of breath or chest pain.  You cannot breathe when you lay down; or if, while lying down, you have to get up and go to the window to get your breath.  You are having increasing swelling without relief from treatment.  You develop a fever over 102 F (38.9 C).  You develop pain or redness in the areas that are swollen.  Tell your caregiver right away if you have gained 03 lb/1.4 kg in 1 day or 05 lb/2.3 kg in a week. MAKE SURE YOU:   Understand these instructions.  Will watch your condition.  Will get help right away if you are not doing well or get worse. Document Released: 01/05/2005 Document Revised: 07/07/2011 Document Reviewed: 08/24/2007 [facility] Patient Information 2014 [place].  Exercise to Lose Weight Exercise and a healthy diet may help you lose weight. Your doctor may suggest specific exercises. EXERCISE IDEAS AND TIPS  Choose low-cost things you enjoy doing, such as walking, bicycling, or exercising to workout videos.  Take stairs instead  of the elevator.  Walk during your lunch break.  Park your car further away from work or school.  Go to a gym or an exercise class.  Start with 5 to 10 minutes of exercise each day. Build up to 30 minutes of exercise 4 to 6 days a week.  Wear shoes with good support and comfortable clothes.  Stretch before and after working out.  Work out until you breathe harder and your heart beats faster.  Drink extra water when you exercise.  Do not do so much that you hurt yourself, feel dizzy, or get very short of breath. Exercises that burn about 150 calories:  Running 1  miles in 15 minutes.  Playing volleyball for 45 to 60 minutes.  Washing and waxing a car for 45 to 60 minutes.  Playing touch football for 45 minutes.  Walking 1  miles in 35 minutes.  Pushing a stroller 1  miles in 30 minutes.  Playing basketball for 30 minutes.  Raking leaves for 30 minutes.  Bicycling 5  miles in 30 minutes.  Walking 2 miles in 30 minutes.  Dancing for 30 minutes.  Shoveling snow for 15 minutes.  Swimming laps for 20 minutes.  Walking up stairs for 15 minutes.  Bicycling 4 miles in 15 minutes.  Gardening for 30 to 45 minutes.  Jumping rope for 15 minutes.  Washing windows or floors for 45 to 60 minutes. Document Released: 02/07/2010 Document Revised: 03/30/2011 Document Reviewed: 02/07/2010 [facility] Patient Information 2014 [place], [place].

## 2013-04-19 NOTE — ED Notes (Signed)
Ptm returned from Xray. Sarah, NT at the bedside hooking patient back up to the monitor.

## 2013-04-19 NOTE — ED Notes (Signed)
Dr. James at the bedside.  

## 2013-05-15 ENCOUNTER — Emergency Department (INDEPENDENT_AMBULATORY_CARE_PROVIDER_SITE_OTHER)
Admission: EM | Admit: 2013-05-15 | Discharge: 2013-05-15 | Disposition: A | Discharge status: Other Acute Inpatient Hospital | Payer: No Typology Code available for payment source | Source: Home / Self Care | Attending: Emergency Medicine | Admitting: Emergency Medicine

## 2013-05-15 ENCOUNTER — Encounter (HOSPITAL_COMMUNITY): Payer: Self-pay | Admitting: Emergency Medicine

## 2013-05-15 ENCOUNTER — Other Ambulatory Visit (HOSPITAL_COMMUNITY): Payer: No Typology Code available for payment source

## 2013-05-15 ENCOUNTER — Emergency Department (HOSPITAL_COMMUNITY): Payer: No Typology Code available for payment source

## 2013-05-15 ENCOUNTER — Emergency Department (HOSPITAL_COMMUNITY)
Admission: EM | Admit: 2013-05-15 | Discharge: 2013-05-15 | Disposition: A | Discharge status: Home / Self Care | Payer: No Typology Code available for payment source | Attending: Emergency Medicine | Admitting: Emergency Medicine

## 2013-05-15 ENCOUNTER — Emergency Department (INDEPENDENT_AMBULATORY_CARE_PROVIDER_SITE_OTHER): Payer: No Typology Code available for payment source

## 2013-05-15 DIAGNOSIS — IMO0002 Reserved for concepts with insufficient information to code with codable children: Secondary | ICD-10-CM | POA: Insufficient documentation

## 2013-05-15 DIAGNOSIS — R0989 Other specified symptoms and signs involving the circulatory and respiratory systems: Secondary | ICD-10-CM

## 2013-05-15 DIAGNOSIS — R0609 Other forms of dyspnea: Secondary | ICD-10-CM

## 2013-05-15 DIAGNOSIS — F17200 Nicotine dependence, unspecified, uncomplicated: Secondary | ICD-10-CM | POA: Insufficient documentation

## 2013-05-15 DIAGNOSIS — J45909 Unspecified asthma, uncomplicated: Secondary | ICD-10-CM

## 2013-05-15 DIAGNOSIS — Z79899 Other long term (current) drug therapy: Secondary | ICD-10-CM | POA: Insufficient documentation

## 2013-05-15 DIAGNOSIS — J45901 Unspecified asthma with (acute) exacerbation: Secondary | ICD-10-CM

## 2013-05-15 DIAGNOSIS — R0600 Dyspnea, unspecified: Secondary | ICD-10-CM

## 2013-05-15 DIAGNOSIS — Z791 Long term (current) use of non-steroidal anti-inflammatories (NSAID): Secondary | ICD-10-CM | POA: Insufficient documentation

## 2013-05-15 DIAGNOSIS — I10 Essential (primary) hypertension: Secondary | ICD-10-CM | POA: Insufficient documentation

## 2013-05-15 HISTORY — DX: Essential (primary) hypertension: I10

## 2013-05-15 LAB — CBC
HCT: 42.6 % (ref 39.0–52.0)
Hemoglobin: 13.5 g/dL (ref 13.0–17.0)
MCH: 28.5 pg (ref 26.0–34.0)
MCHC: 31.7 g/dL (ref 30.0–36.0)
MCV: 90.1 fL (ref 78.0–100.0)
Platelets: 232 10*3/uL (ref 150–400)
RBC: 4.73 MIL/uL (ref 4.22–5.81)
RDW: 15 % (ref 11.5–15.5)
WBC: 9.3 10*3/uL (ref 4.0–10.5)

## 2013-05-15 LAB — BASIC METABOLIC PANEL
BUN: 19 mg/dL (ref 6–23)
CHLORIDE: 100 meq/L (ref 96–112)
CO2: 28 mEq/L (ref 19–32)
Calcium: 9.5 mg/dL (ref 8.4–10.5)
Creatinine, Ser: 0.98 mg/dL (ref 0.50–1.35)
Glucose, Bld: 106 mg/dL — ABNORMAL HIGH (ref 70–99)
POTASSIUM: 4.1 meq/L (ref 3.7–5.3)
SODIUM: 140 meq/L (ref 137–147)

## 2013-05-15 LAB — I-STAT TROPONIN, ED: TROPONIN I, POC: 0.02 ng/mL (ref 0.00–0.08)

## 2013-05-15 LAB — D-DIMER, QUANTITATIVE (NOT AT ARMC): D DIMER QUANT: 0.61 ug{FEU}/mL — AB (ref 0.00–0.48)

## 2013-05-15 LAB — PRO B NATRIURETIC PEPTIDE: Pro B Natriuretic peptide (BNP): 37.1 pg/mL (ref 0–125)

## 2013-05-15 MED ORDER — PREDNISONE 20 MG PO TABS
60.0000 mg | ORAL_TABLET | Freq: Once | ORAL | Status: AC
  Administered 2013-05-15: 60 mg via ORAL
  Filled 2013-05-15: qty 3

## 2013-05-15 MED ORDER — IOHEXOL 350 MG/ML SOLN
100.0000 mL | Freq: Once | INTRAVENOUS | Status: AC | PRN
  Administered 2013-05-15: 100 mL via INTRAVENOUS

## 2013-05-15 MED ORDER — SODIUM CHLORIDE 0.9 % IJ SOLN
INTRAMUSCULAR | Status: AC
  Filled 2013-05-15: qty 3

## 2013-05-15 MED ORDER — PREDNISONE 20 MG PO TABS: 40.0000 mg | ORAL_TABLET | Freq: Every day | ORAL | Status: DC

## 2013-05-15 MED ORDER — IPRATROPIUM-ALBUTEROL 0.5-2.5 (3) MG/3ML IN SOLN
3.0000 mL | Freq: Once | RESPIRATORY_TRACT | Status: AC
  Administered 2013-05-15: 3 mL via RESPIRATORY_TRACT

## 2013-05-15 MED ORDER — IPRATROPIUM-ALBUTEROL 0.5-2.5 (3) MG/3ML IN SOLN
3.0000 mL | Freq: Once | RESPIRATORY_TRACT | Status: AC
  Administered 2013-05-15: 3 mL via RESPIRATORY_TRACT
  Filled 2013-05-15: qty 3

## 2013-05-15 MED ORDER — IPRATROPIUM-ALBUTEROL 0.5-2.5 (3) MG/3ML IN SOLN
RESPIRATORY_TRACT | Status: AC
  Filled 2013-05-15: qty 3

## 2013-05-15 MED ORDER — LISINOPRIL-HYDROCHLOROTHIAZIDE 20-25 MG PO TABS: 1.0000 | ORAL_TABLET | Freq: Every day | ORAL | Status: DC

## 2013-05-15 MED ORDER — ALBUTEROL SULFATE (2.5 MG/3ML) 0.083% IN NEBU
2.5000 mg | INHALATION_SOLUTION | Freq: Once | RESPIRATORY_TRACT | Status: AC
  Administered 2013-05-15: 2.5 mg via RESPIRATORY_TRACT
  Filled 2013-05-15: qty 3

## 2013-05-15 MED ORDER — ALBUTEROL SULFATE (2.5 MG/3ML) 0.083% IN NEBU
5.0000 mg | INHALATION_SOLUTION | Freq: Once | RESPIRATORY_TRACT | Status: AC
  Administered 2013-05-15: 5 mg via RESPIRATORY_TRACT
  Filled 2013-05-15: qty 6

## 2013-05-15 MED ORDER — ALBUTEROL SULFATE HFA 108 (90 BASE) MCG/ACT IN AERS
2.0000 | INHALATION_SPRAY | RESPIRATORY_TRACT | Status: AC
  Administered 2013-05-15: 2 via RESPIRATORY_TRACT
  Filled 2013-05-15: qty 6.7

## 2013-05-15 NOTE — ED Provider Notes (Signed)
Chief Complaint   Chief Complaint  Patient presents with  . Shortness of Breath    History of Present Illness   Timothy Le is a 53 year old male with a history of high blood pressure who presents with a one-month history of shortness of breath. This is both with exertion and at rest and sometimes at nighttime. The shortness of breath was worse today, so he came [facility]. He's had coughing and wheezing. No sputum production or fever. His chest feels tight and he has some swelling of his ankles. No nausea or diaphoresis. No cardiac history. He was at the emergency room 3 weeks ago at which time he had similar symptoms. Workup at that time revealed normal chest x-ray, EKG, and a mildly elevated BNP. He was treated with medication for his blood pressure, diuretics, albuterol, and Qvar. He is a homeless gentleman and has been getting his medical care through a nurse practitioner at the [facility].  Review of Systems   Other than as noted above, the patient denies any of the following symptoms. Systemic:  No fever or chills. ENT:  No nasal congestion, rhinorrhea, or sore throat. Pulmonary:  No cough, wheezing, sputum production, hemoptysis. Cardiac:  No chest pain, palpitations, rapid heartbeat, dizziness, presyncope or syncope. Skin:  No rash or itching. Ext:  No leg pain or swelling. Psych:  No anxiety or depression.  [place]   Past medical history, family history, social history, meds, and allergies were reviewed. Current meds include albuterol, Qvar, lisinopril/hydrochlorothiazide, and Naprosyn.  Physical Examination    Vital signs:  BP 130/69  Pulse 96  Temp(Src) 98.1 F (36.7 C) (Oral)  Resp 18  SpO2 95% Gen:  Alert, oriented, in no distress, skin warm and dry. He has noisy respirations and he's morbidly obese with a very thick neck. Eye:  PERRL, lids and conjunctivas normal.  Sclera non-icteric. ENT:  Mucous membranes moist, pharynx  clear. Neck:  Supple, no adenopathy or tenderness.  No JVD. Lungs:  Clear to auscultation and percussion, no wheezes, rales or rhonchi.  No respiratory distress. Heart:  Regular rhythm.  No gallops, murmers, clicks or rubs. Abdomen:  Soft, nontender, no organomegaly or mass.  Bowel sounds normal.  No pulsatile abdominal mass or bruit. Ext:  No edema.  No calf tenderness and Homann's sign negative.  Pulses full and equal. Skin:  Warm and dry.  No rash.  Labs   Results for orders placed during the hospital encounter of 05/15/13  D-DIMER, QUANTITATIVE      Result Value Ref Range   D-Dimer, Quant 0.61 (*) 0.00 - 0.48 ug/mL-FEU  PRO B NATRIURETIC PEPTIDE      Result Value Ref Range   Pro B Natriuretic peptide (BNP) 37.1  0 - 125 pg/mL     Radiology   Dg Chest 2 View  05/15/2013   CLINICAL DATA:  Chest pain, shortness of breath.  EXAM: CHEST  2 VIEW  COMPARISON:  April 19, 2013.  FINDINGS: The heart size and mediastinal contours are within normal limits. Both lungs are clear. No pneumothorax or pleural effusion is noted. The visualized skeletal structures are unremarkable.  IMPRESSION: No acute cardiopulmonary abnormality seen.   Electronically Signed   By: Sabino Dick M.D.   On: 05/15/2013 11:54   EKG Results:  Date: 05/15/2013  Rate: 69  Rhythm: normal sinus rhythm  QRS Axis: normal  Intervals: normal  ST/T Wave abnormalities: normal  Conduction Disutrbances:none  Narrative Interpretation: Normal sinus  rhythm with sinus arrhythmia, otherwise normal EKG. No change from previous EKG of 3 weeks ago.  Old EKG Reviewed: unchanged   Course in [facility] [place]   He was given a DuoNeb breathing treatment and felt a lot better. His lungs were clear both before and after the breathing treatment.  Assessment   The primary encounter diagnosis was Dyspnea. A diagnosis of Asthma was also pertinent to this visit.  He may just have asthma, but with a mildly elevated d-dimer, pulmonary  embolism or DVT needs to be ruled out.  Plan    The patient was transferred to the ED via shuttle in stable condition.  Medical Decision Making:  52 year old male presents with a 1 month history of shortness of breath worse today.  He was seen at ED 3 weeks ago and his BNP was mildly elevated.  All other work up negative. He was treated with albuterol and Q-Var and got transiently better.  Today he complains of shortness of breath, soreness in chest and ankle edema.  On exam he has noisy breathing and a very thick neck.  His lungs were clear and he felt better after a DuoNeb treatment.  His EKG, CXR, and BNP were normal.  His D-dimer was 0.61 necessitating ruling out PE.  We will send by shuttle.        Harden Mo, MD 05/15/13 [phone]

## 2013-05-15 NOTE — ED Notes (Addendum)
C/o woke this AM w worsening SOB. Audible wheezing noted Dr Jake Michaelis advised

## 2013-05-15 NOTE — Discharge Instructions (Signed)
We have determined that your problem requires further evaluation in the emergency department.  We will take care of your transport there.  Once at the emergency department, you will be evaluated by a provider and they will order whatever treatment or tests they deem necessary.  We cannot guarantee that they will do any specific test or do any specific treatment.  ° °

## 2013-05-15 NOTE — Discharge Instructions (Signed)
°Emergency Department Resource Guide °1) Find a Doctor and Pay Out of Pocket °Although you won't have to find out who is covered by your insurance plan, it is a good idea to ask around and get recommendations. You will then need to call the office and see if the doctor you have chosen will accept you as a new patient and what types of options they offer for patients who are self-pay. Some doctors offer discounts or will set up payment plans for their patients who do not have insurance, but you will need to ask so you aren't surprised when you get to your appointment. ° °2) Contact Your Local Health Department °Not all health departments have doctors that can see patients for sick visits, but many do, so it is worth a call to see if yours does. If you don't know where your local health department is, you can check in your phone book. The CDC also has a tool to help you locate your state's health department, and many state websites also have listings of all of their local health departments. ° °3) Find a Walk-in Clinic °If your illness is not likely to be very severe or complicated, you may want to try a walk in clinic. These are popping up all over the country in pharmacies, drugstores, and shopping centers. They're usually staffed by nurse practitioners or physician assistants that have been trained to treat common illnesses and complaints. They're usually fairly quick and inexpensive. However, if you have serious medical issues or chronic medical problems, these are probably not your best option. ° °No Primary Care Doctor: °- Call [facility] at  832-8000 - they can help you locate a primary care doctor that  accepts your insurance, provides certain services, etc. °- Physician Referral Service- 1-800-533-3463 ° °Chronic Pain Problems: °Organization         Address  Phone   Notes  °[facility]  (336) 297-2271 Patients need to be referred by their primary care doctor.  ° °Medication  Assistance: °Organization         Address  Phone   Notes  °[place] [facility] [address] °[place], [place] [address] (336) 641-8030 --Must be a resident of [place] °-- Must have NO insurance coverage whatsoever (no Medicaid/ Medicare, etc.) °-- The pt. MUST have a primary care doctor that directs their care regularly and follows them in the community °  °[facility]  (866) 331-1348   °[employer]  (888) 892-1162   ° °Agencies that provide inexpensive medical care: °Organization         Address  Phone   Notes  °[facility]  (336) 832-8035   ° Internal Medicine    (336) 832-7272   °[facility] [address] °[place], [place] [address] (336) 832-4777   °[facility] [address], °[place] (336) 271-4999   °[facility]    (336) 373-0678   °[facility]    (336) 272-1050   °[facility] ° [address], [place] Phone:  (336) 832-4444, Fax:  (336) 832-4440 Hours of Operation:  9 am - 6 pm, M-F.  Also accepts Medicaid/Medicare and self-pay.  °[facility] ° [address],  Phone: (336) 832-3150, Fax: (336) 832-3151. Hours of Operation:  8:30 am - 5:30 pm, M-F.  Also accepts Medicaid and self-pay.  °[facility] [place] [address], [place] Phone: (336) 878-6027   °[facility] [address], [place], [place] (336)723-1848, Ext. 123 Mondays & Thursdays: 7-9 AM.  First 15 patients are seen on a first come, first serve basis. °  ° °Medicaid-accepting [place] Providers: ° °Organization         Address  Phone   Notes  °[facility] [address], [place] (336) 641-2100 Also accepts self-pay patients.  °[facility] [address], [place] ° (336) 856-9996   °[facility] [address], [place]  (336) 288-8857   °[facility] [address] [place] [address], [place] (336) 299-7000   °Veita Bland [address], [place]  ° (336) 373-1557 Only accepts [place] Access Medicaid patients after they have their name applied to their card.  ° °Self-Pay (no insurance) in [place]: ° °Organization         Address  Phone   Notes  °Sickle Cell Patients, [facility] [address], [place] (336) 832-1970   °[facility] [address], [place] (336) 832-4400   °[facility] [place] ° [address],  (336) 992-4800   °[facility]/Dr. Osei-Bonsu ° [address] [place] [address], [place] or [address], [place] (336) 841-8500 Phone number for both [place] and [place] locations is the same.  °[facility] [address], [place] (336) 299-0000   °[facility] [address] [place] [address], [place] or [address] (336) 852-7530 °(336) 878-2260   °[facility] [address], [place] (336) 350-1642, phone; (336) 294-5005, fax Sees patients 1st and 3rd Saturday of every month.  Must not qualify for public or private insurance (i.e. Medicaid, Medicare, [place] Health Choice, Veterans' Benefits) • Household income should be no more than 200% of the poverty level •The clinic cannot treat you if you are pregnant or think you are pregnant • Sexually transmitted diseases are not treated at the clinic.  ° ° °Dental Care: °Organization         Address  Phone  Notes  °[place] [facility] [address], [place] (336) 641-6152 Accepts children up to age 21 who are enrolled in Medicaid or [place] Health Choice; pregnant women with a Medicaid card; and children who have applied for Medicaid or [employer] Health Choice, but were declined, whose parents can pay a reduced fee at time of service.  °[place]  [facility] [place]  [address], [place] (336) 641-7733 Accepts children up to age 21 who are enrolled in Medicaid or [place] Health Choice; pregnant women with a Medicaid card; and children who have applied for Medicaid or  Health Choice, but were declined, whose parents can pay a reduced fee at time of service.  °[facility] ° [address], [place] (336) 641-4533 Patients are seen by appointment only. Walk-ins are not accepted. [facility] will see patients 18 years of age and older. °Monday - Tuesday (8am-5pm) °Most Wednesdays (8:30-5pm) °$30 per visit, cash only  °[facility] ° [address], [place] (336) 641-4533 Patients are seen by appointment only. Walk-ins are not accepted. [facility] will see patients 18 years of age and older. °One   Wednesday Evening (Monthly: Volunteer Based).  $30 per visit, cash only  °[facility]  (919) 537-3737 for adults; Children under age 4, call Graduate Pediatric Dentistry at (919) [phone]. Children aged 4-14, please call (919) [phone] to request a pediatric application. ° Dental services are provided in all areas of dental care including fillings, crowns and bridges, complete and partial dentures, implants, gum treatment, root canals, and extractions. Preventive care is also provided. Treatment is provided to both adults and children. °Patients are selected via a lottery and there is often a waiting list. °  °[facility] [address], °[place] ° (336) 763-8833 [email or web address] °  °[facility] [address], [place], [place] (336)723-1848, Ext. 123 Second and Fourth Thursday of each month, opens at 6:30 AM; Clinic ends at 9 AM.  Patients are seen on a first-come first-served basis, and a limited number are seen during each clinic.  ° °[facility] ° [address], [place], [place] (336) 723-7904    Eligibility Requirements °You must have lived in [place], or [place] counties for at least the last three months. °  You cannot be eligible for state or federal sponsored healthcare insurance, including Veterans Administration, Medicaid, or Medicare. °  You generally cannot be eligible for healthcare insurance through your employer.  °  How to apply: °Eligibility screenings are held every Tuesday and Wednesday afternoon from 1:00 pm until 4:00 pm. You do not need an appointment for the interview!  °[facility] [address], [place], [place] 336-631-2330   °[facility]  336-342-8273   °[facility]  336-703-3100   °[facility]  336-570-6415   ° °Behavioral Health Resources in the Community: °Intensive Outpatient Programs °Organization         Address  Phone  Notes  °[facility] [address], [place], [place] 336-878-6098   °[facility] [address], [place], [place] 336-832-9800   °[facility] [address], [place], [place] ° 336-882-2125   °[facility] [address],  °[place], [place] 1-800-853-5163 or 336-641-4981   °Substance Abuse Resources °Organization         Address  Phone  Notes  °[facility]  336-882-2125   °[facility]  336-784-9470   °[facility]  336-285-9073   °[facility]  336-845-3988   °Residential & Outpatient Substance Abuse Program  1-800-659-3381   °Psychological Services °Organization         Address  Phone  Notes  °[facility]  336- 832-9600   °[facility]  336- 378-7881   °[facility] [address], [place] 1-800-853-5163 or 336-641-4981   ° °Mobile Crisis Teams °Organization         Address  Phone  Notes  °[facility], Mobile Crisis Care Unit  1-877-626-1772   °Assertive °[facility] ° [address]  [place], [place] 336-834-9664   °Sharon DeEsch [address] °[place] 336-554-5454   ° °Self-Help/Support Groups °Organization         Address  Phone             Notes  °[facility] [place] - variety of support groups  336- 373-1402 Call for more information  °Narcotics Anonymous (NA), [facility] [address], °[place]   2 meetings at this location  ° °  Residential Treatment Programs Organization         Address  Phone  Notes  [facility] [address],    [place]  1-437-047-9682   [facility]  [address] [place] [address], [place], [place]   [place], [place] (313)548-7681 Admissions: 8am-3pm M-F  [facility] [address]    [place], [place] 540-086-7619   [facility] [address] [place], [place], [place]   [facility] [address]  [place], [place]   [facility] Intensive [address], [place], [place]   [facility] [address]  [place], [place] 1-(617)012-9922 or (515)862-7358   [facility] [address] [place], [place] Accepts Medicaid  [facility] [address]  [place] 1-(320)682-6764 Substance Abuse/Addiction Treatment   [facility]         Address  Phone  Notes  [facility]  6404048033   Domenic Schwab, PhD [address] [place], [place]   762-688-0335 or 517-721-5717   [place], [place] 989-764-5558   [facility] [address] [place], [place] 6788479228 Insurance/Medicaid/sponsorship through [facility] and Families [address]                                    [place], [place] (205) 130-2210 [place] [address] [place], [place] [phone]    Dr. Adele Schilder  [phone]   [facility] Dept. 1) [address], [place] 2) [place] 3)  [address], [place] ([phone]) [phone]  [phone]   [place] (386)706-6790 or 786-271-0910 (After Hours)       Take the prescription as directed.  Use your albuterol inhaler (2 to 4 puffs) every 4 hours for the next 7 days, then as needed for cough, wheezing, or shortness of breath. Your CT scan showed an incidental finding of an enlarged left thyroid lobe with presumed focal nodule/mass. This could be further evaluated with thyroid ultrasound." Your regular medical doctor can follow up this finding.  Call your regular medical doctor today to schedule a follow up appointment within the next 3 days.  Return to the Emergency Department immediately sooner if worsening.

## 2013-05-15 NOTE — ED Provider Notes (Signed)
CSN: [id number]     Arrival date & time 05/15/13  1303 History   First MD Initiated Contact with Patient 05/15/13 1333     Chief Complaint  Patient presents with  . Shortness of Breath      HPI Pt was seen at 1405.  Per pt, c/o gradual onset and worsening of persistent cough, wheezing and SOB for the past 1 month, worse over the past few days.  Describes his symptoms as "my asthma is acting up."  Has been using home MDI today without relief.  Pt was [facility] at [facility], received neb with improvement in his symptoms, then sent to the ED for further evaluation. Denies CP/palpitations, no back pain, no abd pain, no N/V/D, no fevers, no rash.     Past Medical History  Diagnosis Date  . Asthma   . Hypertension    History reviewed. No pertinent past surgical history.  History  Substance Use Topics  . Smoking status: Current Every Day Smoker  . Smokeless tobacco: Not on file  . Alcohol Use: Yes    Review of Systems ROS: Statement: All systems negative except as marked or noted in the HPI; Constitutional: Negative for fever and chills. ; ; Eyes: Negative for eye pain, redness and discharge. ; ; ENMT: Negative for ear pain, hoarseness, nasal congestion, sinus pressure and sore throat. ; ; Cardiovascular: Negative for chest pain, palpitations, diaphoresis, and peripheral edema. ; ; Respiratory: +cough, wheezing, SOB. Negative for stridor. ; ; Gastrointestinal: Negative for nausea, vomiting, diarrhea, abdominal pain, blood in stool, hematemesis, jaundice and rectal bleeding. . ; ; Genitourinary: Negative for dysuria, flank pain and hematuria. ; ; Musculoskeletal: Negative for back pain and neck pain. Negative for swelling and trauma.; ; Skin: Negative for pruritus, rash, abrasions, blisters, bruising and skin lesion.; ; Neuro: Negative for headache, lightheadedness and neck stiffness. Negative for weakness, altered level of consciousness , altered mental status, extremity weakness, paresthesias,  involuntary movement, seizure and syncope.      Allergies  Review of patient's allergies indicates no known allergies.  Home Medications   Prior to Admission medications   Medication Sig Start Date End Date Taking? Authorizing Provider  albuterol (PROVENTIL HFA;VENTOLIN HFA) 108 (90 BASE) MCG/ACT inhaler Inhale into the lungs every 6 (six) hours as needed for wheezing or shortness of breath.   Yes Historical Provider, MD  beclomethasone (QVAR) 40 MCG/ACT inhaler Inhale into the lungs 2 (two) times daily.   Yes Historical Provider, MD  fluticasone (FLONASE) 50 MCG/ACT nasal spray Place 2 sprays into both nostrils daily.   Yes Historical Provider, MD  lisinopril-hydrochlorothiazide (PRINZIDE,ZESTORETIC) 20-25 MG per tablet Take 1 tablet by mouth daily.   Yes Historical Provider, MD  naproxen (NAPROSYN) 500 MG tablet Take 500 mg by mouth 2 (two) times daily with a meal.   Yes Historical Provider, MD   BP 101/57  Pulse 69  Temp(Src) 98 F (36.7 C) (Oral)  Resp 11  Wt 378 lb (171.46 kg)  SpO2 100% Physical Exam 1410: Physical examination:  Nursing notes reviewed; Vital signs and O2 SAT reviewed;  Constitutional: Well developed, Well nourished, Well hydrated, In no acute distress; Head:  Normocephalic, atraumatic; Eyes: EOMI, PERRL, No scleral icterus; ENMT: TM's clear bilat. +edemetous nasal turbinates bilat with clear rhinorrhea. Mouth and pharynx normal, Mucous membranes moist; Neck: Supple, Full range of motion, No lymphadenopathy; Cardiovascular: Regular rate and rhythm, No murmur, rub, or gallop; Respiratory: Breath sounds coarse & equal bilaterally, No wheezes. Speaking full sentences  with ease, Normal respiratory effort/excursion; Chest: Nontender, Movement normal; Abdomen: Soft, Nontender, Nondistended, Normal bowel sounds; Genitourinary: No CVA tenderness; Extremities: Pulses normal, No tenderness, No edema, No calf edema or asymmetry.; Neuro: AA&Ox3, Major CN grossly intact.  Speech  clear. No gross focal motor or sensory deficits in extremities. Climbs on and off stretcher easily by himself. Gait steady.; Skin: Color normal, Warm, Dry.   ED Course  Procedures     EKG Interpretation   Date/Time:  Monday May 15 2013 13:14:28 EDT Ventricular Rate:  70 PR Interval:  180 QRS Duration: 100 QT Interval:  386 QTC Calculation: 416 R Axis:   51 Text Interpretation:  Normal sinus rhythm with sinus arrhythmia Normal ECG  When compared with ECG of 05/15/2013 No significant change was found  Confirmed by [facility]  MD, Nunzio [name] [phone]) on 05/15/2013 1:40:31 PM      MDM  MDM Reviewed: previous chart, nursing note and vitals Reviewed previous: labs and ECG Interpretation: labs, ECG and x-ray    Results for orders placed during the hospital encounter of 05/15/13  CBC      Result Value Ref Range   WBC 9.3  4.0 - 10.5 K/uL   RBC 4.73  4.22 - 5.81 MIL/uL   Hemoglobin 13.5  13.0 - 17.0 g/dL   HCT 42.6  39.0 - 52.0 %   MCV 90.1  78.0 - 100.0 fL   MCH 28.5  26.0 - 34.0 pg   MCHC 31.7  30.0 - 36.0 g/dL   RDW 15.0  11.5 - 15.5 %   Platelets 232  150 - 400 K/uL  BASIC METABOLIC PANEL      Result Value Ref Range   Sodium 140  137 - 147 mEq/L   Potassium 4.1  3.7 - 5.3 mEq/L   Chloride 100  96 - 112 mEq/L   CO2 28  19 - 32 mEq/L   Glucose, Bld 106 (*) 70 - 99 mg/dL   BUN 19  6 - 23 mg/dL   Creatinine, Ser 0.98  0.50 - 1.35 mg/dL   Calcium 9.5  8.4 - 10.5 mg/dL   GFR calc non Af Amer >90  >90 mL/min   GFR calc Af Amer >90  >90 mL/min  I-STAT TROPOININ, ED      Result Value Ref Range   Troponin i, poc 0.02  0.00 - 0.08 ng/mL   Comment 3            Dg Chest 2 View 05/15/2013   CLINICAL DATA:  Chest pain, shortness of breath.  EXAM: CHEST  2 VIEW  COMPARISON:  April 19, 2013.  FINDINGS: The heart size and mediastinal contours are within normal limits. Both lungs are clear. No pneumothorax or pleural effusion is noted. The visualized skeletal structures are  unremarkable.  IMPRESSION: No acute cardiopulmonary abnormality seen.   Electronically Signed   By: Sabino Dick M.D.   On: 05/15/2013 11:54   [facility] labs: BNP 37.1, D-dimer 0.61.  Ct Angio Chest Pe W/cm &/or Wo Cm 05/15/2013   CLINICAL DATA:  Shortness of breath.  Elevated D-dimer.  EXAM: CT ANGIOGRAPHY CHEST WITH CONTRAST  TECHNIQUE: Multidetector CT imaging of the chest was performed using the standard protocol during bolus administration of intravenous contrast. Multiplanar CT image reconstructions and MIPs were obtained to evaluate the vascular anatomy.  CONTRAST:  157mL OMNIPAQUE IOHEXOL 350 MG/ML SOLN  COMPARISON:  Chest x-ray performed today.  FINDINGS: Enlargement of the left thyroid lobe with presumed mass in  the left thyroid lobe measuring up to 4.5 cm. This has mass effect on the adjacent trachea which is deviated to the right.  Small scattered mediastinal lymph nodes, none pathologically enlarged. Heart is normal size. Aorta is normal caliber.  No filling defects in the pulmonary arteries to suggest pulmonary emboli. No mediastinal, hilar, or axillary adenopathy. Chest wall soft tissues are unremarkable. Imaging into the upper abdomen shows no acute findings. No acute bony abnormality. Flowing anterior osteophytes in the mid thoracic spine to the visualized upper lumbar spine compatible with DISH.  Review of the MIP images confirms the above findings.  IMPRESSION: No evidence of pulmonary embolus.  Enlarged left thyroid lobe with presumed focal nodule/mass. This could be further evaluated with thyroid ultrasound.  No acute findings in the chest.   Electronically Signed   By: [name] M.D.   On: 05/15/2013 16:52    1700:  Pt states he "feels better" after neb and steroid.  NAD, lungs CTA bilat, no wheezing, resps easy, speaking full sentences, Sats 100% R/A. Pt wants to go home now. Will tx for asthma exacerbation. Pt requesting refill of his BP med; will refill. Dx and testing d/w pt.  Questions  answered.  Verb understanding, agreeable to d/c home with outpt f/u.     Alfonzo Feller, DO 05/17/13 1650

## 2013-05-15 NOTE — ED Notes (Signed)
Pt sent from [facility] for evaluation for elevated d-dimer, needs PE rule out. Reports SOB has worsened over past few days. Audible wheezing noted. Reports tightening in chest. Pt is a x 4. Skin is warm and dry.

## 2013-07-18 ENCOUNTER — Emergency Department (HOSPITAL_COMMUNITY)
Admission: EM | Admit: 2013-07-18 | Discharge: 2013-07-18 | Disposition: A | Discharge status: Home / Self Care | Payer: Medicaid Other | Attending: Emergency Medicine | Admitting: Emergency Medicine

## 2013-07-18 ENCOUNTER — Encounter (HOSPITAL_COMMUNITY): Payer: Self-pay | Admitting: Emergency Medicine

## 2013-07-18 DIAGNOSIS — M5430 Sciatica, unspecified side: Secondary | ICD-10-CM | POA: Diagnosis not present

## 2013-07-18 DIAGNOSIS — M545 Low back pain, unspecified: Secondary | ICD-10-CM | POA: Diagnosis not present

## 2013-07-18 DIAGNOSIS — Z760 Encounter for issue of repeat prescription: Secondary | ICD-10-CM | POA: Diagnosis not present

## 2013-07-18 DIAGNOSIS — Z791 Long term (current) use of non-steroidal anti-inflammatories (NSAID): Secondary | ICD-10-CM | POA: Diagnosis not present

## 2013-07-18 DIAGNOSIS — I10 Essential (primary) hypertension: Secondary | ICD-10-CM | POA: Diagnosis not present

## 2013-07-18 DIAGNOSIS — M5441 Lumbago with sciatica, right side: Secondary | ICD-10-CM

## 2013-07-18 DIAGNOSIS — E119 Type 2 diabetes mellitus without complications: Secondary | ICD-10-CM | POA: Diagnosis not present

## 2013-07-18 DIAGNOSIS — Z79899 Other long term (current) drug therapy: Secondary | ICD-10-CM | POA: Insufficient documentation

## 2013-07-18 DIAGNOSIS — J45909 Unspecified asthma, uncomplicated: Secondary | ICD-10-CM | POA: Insufficient documentation

## 2013-07-18 DIAGNOSIS — IMO0002 Reserved for concepts with insufficient information to code with codable children: Secondary | ICD-10-CM | POA: Diagnosis not present

## 2013-07-18 DIAGNOSIS — F17200 Nicotine dependence, unspecified, uncomplicated: Secondary | ICD-10-CM | POA: Insufficient documentation

## 2013-07-18 DIAGNOSIS — M549 Dorsalgia, unspecified: Secondary | ICD-10-CM | POA: Diagnosis present

## 2013-07-18 HISTORY — DX: Type 2 diabetes mellitus without complications: E11.9

## 2013-07-18 MED ORDER — ALBUTEROL SULFATE HFA 108 (90 BASE) MCG/ACT IN AERS
2.0000 | INHALATION_SPRAY | Freq: Four times a day (QID) | RESPIRATORY_TRACT | Status: DC | PRN
Start: 2013-07-18 — End: 2014-12-30

## 2013-07-18 MED ORDER — FENTANYL CITRATE 0.05 MG/ML IJ SOLN
50.0000 ug | Freq: Once | INTRAMUSCULAR | Status: AC
  Administered 2013-07-18: 50 ug via INTRAMUSCULAR
  Filled 2013-07-18: qty 2

## 2013-07-18 MED ORDER — LISINOPRIL-HYDROCHLOROTHIAZIDE 20-25 MG PO TABS: 1.0000 | ORAL_TABLET | Freq: Every day | ORAL | Status: DC

## 2013-07-18 MED ORDER — LIDOCAINE 5 % EX PTCH: 2.0000 | MEDICATED_PATCH | CUTANEOUS | Status: DC

## 2013-07-18 MED ORDER — METFORMIN HCL 500 MG PO TABS: 500.0000 mg | ORAL_TABLET | Freq: Every day | ORAL | Status: DC

## 2013-07-18 MED ORDER — LIDOCAINE 5 % EX PTCH
2.0000 | MEDICATED_PATCH | CUTANEOUS | Status: DC
  Administered 2013-07-18: 2 via TRANSDERMAL
  Filled 2013-07-18 (×2): qty 2

## 2013-07-18 MED ORDER — DIAZEPAM 5 MG/ML IJ SOLN
5.0000 mg | Freq: Once | INTRAMUSCULAR | Status: AC
  Administered 2013-07-18: 5 mg via INTRAMUSCULAR
  Filled 2013-07-18: qty 2

## 2013-07-18 MED ORDER — DIAZEPAM 5 MG PO TABS: 5.0000 mg | ORAL_TABLET | Freq: Two times a day (BID) | ORAL | Status: DC

## 2013-07-18 MED ORDER — BECLOMETHASONE DIPROPIONATE 40 MCG/ACT IN AERS: 2.0000 | INHALATION_SPRAY | Freq: Every day | RESPIRATORY_TRACT | Status: DC

## 2013-07-18 MED ORDER — NAPROXEN 500 MG PO TABS: 500.0000 mg | ORAL_TABLET | Freq: Two times a day (BID) | ORAL | Status: DC

## 2013-07-18 NOTE — Discharge Instructions (Signed)
Please follow up with your primary care doctor on July 10th as re-scheduled while here in the ER. Please continue to take your at home medications as prescribed. Please take pain medication and/or muscle relaxants as prescribed and as needed for pain. Please do not drive on narcotic pain medication or on muscle relaxants. Please read all discharge instructions and return precautions.   Back Pain, Adult Low back pain is very common. About 1 in 5 people have back pain.The cause of low back pain is rarely dangerous. The pain often gets better over time.About half of people with a sudden onset of back pain feel better in just 2 weeks. About 8 in 10 people feel better by 6 weeks.  CAUSES Some common causes of back pain include:  Strain of the muscles or ligaments supporting the spine.  Wear and tear (degeneration) of the spinal discs.  Arthritis.  Direct injury to the back. DIAGNOSIS Most of the time, the direct cause of low back pain is not known.However, back pain can be treated effectively even when the exact cause of the pain is unknown.Answering your caregiver's questions about your overall health and symptoms is one of the most accurate ways to make sure the cause of your pain is not dangerous. If your caregiver needs more information, he or she may order lab work or imaging tests (X-rays or MRIs).However, even if imaging tests show changes in your back, this usually does not require surgery. HOME CARE INSTRUCTIONS For many people, back pain returns.Since low back pain is rarely dangerous, it is often a condition that people can learn to [facility] their own.   Remain active. It is stressful on the back to sit or stand in one place. Do not sit, drive, or stand in one place for more than 30 minutes at a time. Take short walks on level surfaces as soon as pain allows.Try to increase the length of time you walk each day.  Do not stay in bed.Resting more than 1 or 2 days can delay your  recovery.  Do not avoid exercise or work.Your body is made to move.It is not dangerous to be active, even though your back may hurt.Your back will likely heal faster if you return to being active before your pain is gone.  Pay attention to your body when you bend and lift. Many people have less discomfortwhen lifting if they bend their knees, keep the load close to their bodies,and avoid twisting. Often, the most comfortable positions are those that put less stress on your recovering back.  Find a comfortable position to sleep. Use a firm mattress and lie on your side with your knees slightly bent. If you lie on your back, put a pillow under your knees.  Only take over-the-counter or prescription medicines as directed by your caregiver. Over-the-counter medicines to reduce pain and inflammation are often the most helpful.Your caregiver may prescribe muscle relaxant drugs.These medicines help dull your pain so you can more quickly return to your normal activities and healthy exercise.  Put ice on the injured area.  Put ice in a plastic bag.  Place a towel between your skin and the bag.  Leave the ice on for 15-20 minutes, 03-04 times a day for the first 2 to 3 days. After that, ice and heat may be alternated to reduce pain and spasms.  Ask your caregiver about trying back exercises and gentle massage. This may be of some benefit.  Avoid feeling anxious or stressed.Stress increases muscle tension and  can worsen back pain.It is important to recognize when you are anxious or stressed and learn ways to manage it.Exercise is a great option. SEEK MEDICAL CARE IF:  You have pain that is not relieved with rest or medicine.  You have pain that does not improve in 1 week.  You have new symptoms.  You are generally not feeling well. SEEK IMMEDIATE MEDICAL CARE IF:   You have pain that radiates from your back into your legs.  You develop new bowel or bladder control problems.  You  have unusual weakness or numbness in your arms or legs.  You develop nausea or vomiting.  You develop abdominal pain.  You feel faint. Document Released: 01/05/2005 Document Revised: 07/07/2011 Document Reviewed: 05/26/2010 [facility] Patient Information 2015 [place], [place]. This information is not intended to replace advice given to you by your health care provider. Make sure you discuss any questions you have with your health care provider. Sciatica Sciatica is pain, weakness, numbness, or tingling along the path of the sciatic nerve. The nerve starts in the lower back and runs down the back of each leg. The nerve controls the muscles in the lower leg and in the back of the knee, while also providing sensation to the back of the thigh, lower leg, and the sole of your foot. Sciatica is a symptom of another medical condition. For instance, nerve damage or certain conditions, such as a herniated disk or bone spur on the spine, pinch or put pressure on the sciatic nerve. This causes the pain, weakness, or other sensations normally associated with sciatica. Generally, sciatica only affects one side of the body. CAUSES   Herniated or slipped disc.  Degenerative disk disease.  A pain disorder involving the narrow muscle in the buttocks (piriformis syndrome).  Pelvic injury or fracture.  Pregnancy.  Tumor (rare). SYMPTOMS  Symptoms can vary from mild to very severe. The symptoms usually travel from the low back to the buttocks and down the back of the leg. Symptoms can include:  Mild tingling or dull aches in the lower back, leg, or hip.  Numbness in the back of the calf or sole of the foot.  Burning sensations in the lower back, leg, or hip.  Sharp pains in the lower back, leg, or hip.  Leg weakness.  Severe back pain inhibiting movement. These symptoms may get worse with coughing, sneezing, laughing, or prolonged sitting or standing. Also, being overweight may worsen  symptoms. DIAGNOSIS  Your caregiver will perform a physical exam to look for common symptoms of sciatica. He or she may ask you to do certain movements or activities that would trigger sciatic nerve pain. Other tests may be performed to find the cause of the sciatica. These may include:  Blood tests.  X-rays.  Imaging tests, such as an MRI or CT scan. TREATMENT  Treatment is directed at the cause of the sciatic pain. Sometimes, treatment is not necessary and the pain and discomfort goes away on its own. If treatment is needed, your caregiver may suggest:  Over-the-counter medicines to relieve pain.  Prescription medicines, such as anti-inflammatory medicine, muscle relaxants, or narcotics.  Applying heat or ice to the painful area.  Steroid injections to lessen pain, irritation, and inflammation around the nerve.  Reducing activity during periods of pain.  Exercising and stretching to strengthen your abdomen and improve flexibility of your spine. Your caregiver may suggest losing weight if the extra weight makes the back pain worse.  Physical therapy.  Surgery  to eliminate what is pressing or pinching the nerve, such as a bone spur or part of a herniated disk. HOME CARE INSTRUCTIONS   Only take over-the-counter or prescription medicines for pain or discomfort as directed by your caregiver.  Apply ice to the affected area for 20 minutes, 3-4 times a day for the first 48-72 hours. Then try heat in the same way.  Exercise, stretch, or perform your usual activities if these do not aggravate your pain.  Attend physical therapy sessions as directed by your caregiver.  Keep all follow-up appointments as directed by your caregiver.  Do not wear high heels or shoes that do not provide proper support.  Check your mattress to see if it is too soft. A firm mattress may lessen your pain and discomfort. SEEK IMMEDIATE MEDICAL CARE IF:   You lose control of your bowel or bladder  (incontinence).  You have increasing weakness in the lower back, pelvis, buttocks, or legs.  You have redness or swelling of your back.  You have a burning sensation when you urinate.  You have pain that gets worse when you lie down or awakens you at night.  Your pain is worse than you have experienced in the past.  Your pain is lasting longer than 4 weeks.  You are suddenly losing weight without reason. MAKE SURE YOU:  Understand these instructions.  Will watch your condition.  Will get help right away if you are not doing well or get worse. Document Released: 12/30/2000 Document Revised: 07/07/2011 Document Reviewed: 05/17/2011 [facility] Patient Information 2015 [place], [place]. This information is not intended to replace advice given to you by your health care provider. Make sure you discuss any questions you have with your health care provider.

## 2013-07-18 NOTE — ED Provider Notes (Signed)
CSN: [id number]     Arrival date & time 07/18/13  3716 History   First MD Initiated Contact with Patient 07/18/13 0759     Chief Complaint  Patient presents with  . Back Pain     (Consider location/radiation/quality/duration/timing/severity/associated sxs/prior Treatment) HPI Comments: Patient is a 53 year old male past medical history significant for asthma, hypertension, DM presenting to the emergency department for right sided lower back pain with intermittent pain radiating down right leg. Patient states he bent over felt a pop on the right side and has had pain since. He states is similar to last week that resolved and sooner. Patient has not tried any medications at home prior to arrival. He states movement aggravates his pain. Denies any fevers, chills, night sweats, bladder or bowel incontinence, numbness in his extremities, history of IV drug use or cancer. No history of back surgeries.   Patient is a 53 y.o. male presenting with back pain.  Back Pain Associated symptoms: no fever, no numbness and no weakness     Past Medical History  Diagnosis Date  . Asthma   . Hypertension   . Diabetes mellitus without complication    History reviewed. No pertinent past surgical history. History reviewed. No pertinent family history. History  Substance Use Topics  . Smoking status: Current Every Day Smoker  . Smokeless tobacco: Not on file  . Alcohol Use: Yes    Review of Systems  Constitutional: Negative for fever and chills.  Musculoskeletal: Positive for back pain and myalgias.  Neurological: Negative for weakness and numbness.  All other systems reviewed and are negative.     Allergies  Review of patient's allergies indicates no known allergies.  Home Medications   Prior to Admission medications   Medication Sig Start Date End Date Taking? Authorizing Provider  albuterol (PROVENTIL HFA;VENTOLIN HFA) 108 (90 BASE) MCG/ACT inhaler Inhale into the lungs every 6 (six) hours  as needed for wheezing or shortness of breath.   Yes Historical Provider, MD  beclomethasone (QVAR) 40 MCG/ACT inhaler Inhale into the lungs 2 (two) times daily.   Yes Historical Provider, MD  Cholecalciferol (VITAMIN D3) 1000 UNITS CAPS Take 1 capsule by mouth daily.   Yes Historical Provider, MD  fluticasone (FLONASE) 50 MCG/ACT nasal spray Place 2 sprays into both nostrils daily.   Yes Historical Provider, MD  Hyprom-Naphaz-Polysorb-Zn Sulf (CLEAR EYES COMPLETE) SOLN Apply 2 drops to eye daily as needed (for dry eyes).   Yes Historical Provider, MD  lisinopril-hydrochlorothiazide (PRINZIDE,ZESTORETIC) 20-25 MG per tablet Take 1 tablet by mouth daily.   Yes Historical Provider, MD  metFORMIN (GLUCOPHAGE) 500 MG tablet Take 500 mg by mouth daily with breakfast.   Yes Historical Provider, MD  naproxen (NAPROSYN) 500 MG tablet Take 500 mg by mouth 2 (two) times daily with a meal.   Yes Historical Provider, MD  Saline (SIMPLY SALINE) 0.9 % AERS Place 2 sprays into the nose daily as needed (for allergies).   Yes Historical Provider, MD  albuterol (PROVENTIL HFA;VENTOLIN HFA) 108 (90 BASE) MCG/ACT inhaler Inhale 2 puffs into the lungs every 6 (six) hours as needed for wheezing or shortness of breath. 07/18/13   Alaiya Martindelcampo L Lucero Ide, PA-C  beclomethasone (QVAR) 40 MCG/ACT inhaler Inhale 2 puffs into the lungs daily. 07/18/13   Chloe Baig L Lorynn Moeser, PA-C  diazepam (VALIUM) 5 MG tablet Take 1 tablet (5 mg total) by mouth 2 (two) times daily. 07/18/13   Brick Ketcher L Laiyla Slagel, PA-C  lidocaine (LIDODERM) 5 % Place 2 patches  onto the skin daily. Remove & Discard patch within 12 hours or as directed by MD 07/18/13   Stephani Police Jimy Gates, PA-C  lisinopril-hydrochlorothiazide (PRINZIDE,ZESTORETIC) 20-25 MG per tablet Take 1 tablet by mouth daily. 07/18/13   Ren Aspinall L Lainie Daubert, PA-C  metFORMIN (GLUCOPHAGE) 500 MG tablet Take 1 tablet (500 mg total) by mouth daily with breakfast. 07/18/13   Stephani Police  Ebany Bowermaster, PA-C  naproxen (NAPROSYN) 500 MG tablet Take 1 tablet (500 mg total) by mouth 2 (two) times daily with a meal. 07/18/13   Floella Ensz L Airabella Barley, PA-C   BP 108/94  Pulse 80  Temp(Src) 98 F (36.7 C) (Oral)  Resp 18  SpO2 96% Physical Exam  Nursing note and vitals reviewed. Constitutional: He is oriented to person, place, and time. He appears well-developed and well-nourished. No distress.  HENT:  Head: Normocephalic and atraumatic.  Right Ear: External ear normal.  Left Ear: External ear normal.  Nose: Nose normal.  Mouth/Throat: Oropharynx is clear and moist. No oropharyngeal exudate.  Eyes: Conjunctivae and EOM are normal. Pupils are equal, round, and reactive to light.  Neck: Normal range of motion. Neck supple.  Cardiovascular: Normal rate, regular rhythm, normal heart sounds and intact distal pulses.   Pulmonary/Chest: Effort normal and breath sounds normal. No respiratory distress.  Abdominal: Soft. There is no tenderness.  Musculoskeletal:       Lumbar back: He exhibits tenderness, pain and spasm. He exhibits no bony tenderness, no swelling, no edema and no deformity.       Back:  Neurological: He is alert and oriented to person, place, and time. He has normal strength. No cranial nerve deficit. Gait normal. GCS eye subscore is 4. GCS verbal subscore is 5. GCS motor subscore is 6.  Sensation grossly intact.  No pronator drift.  Bilateral heel-knee-shin intact.  Skin: Skin is warm and dry. He is not diaphoretic.    ED Course  Procedures (including critical care time) Medications  fentaNYL (SUBLIMAZE) injection 50 mcg (50 mcg Intramuscular Given 07/18/13 0904)  diazepam (VALIUM) injection 5 mg (5 mg Intramuscular Given 07/18/13 0941)    Labs Review Labs Reviewed - No data to display  Imaging Review No results found.   EKG Interpretation None      MDM   Final diagnoses:  Right-sided low back pain with right-sided sciatica  Medication refill     Filed Vitals:   07/18/13 1039  BP: 108/94  Pulse: 80  Temp:   Resp: 18   Afebrile, NAD, non-toxic appearing, AAOx4.    10:10 AM Patient sitting up, endorses improvement in back pain.   Patient with back pain.  No neurological deficits and normal neuro exam.  Patient can walk but states is painful.  No loss of bowel or bladder control.  No concern for cauda equina.  No fever, night sweats, weight loss, h/o cancer, IVDU.  RICE protocol and pain medicine indicated and discussed with patient.  Patient is stable at time of discharge. Limited refill on home medications provided until patient can be seen at rescheduled PCP appointment on July 10th. Patient d/w with Dr. Canary Brim, agrees with plan.     Harlow Mares, PA-C 07/18/13 1526

## 2013-07-18 NOTE — ED Notes (Signed)
Pt able to move much better and reports pain improved.

## 2013-07-18 NOTE — ED Notes (Signed)
Pharmacy called for patch

## 2013-07-18 NOTE — ED Notes (Signed)
Pt reports right sided lower back pain; bent over this morning and felt pop and it came on very suddenly. Similar instance occurred last week but did not go to hospital for it. Pt is morbidly obese. Seems tight all over and belly is tight. From [facility].

## 2013-07-18 NOTE — Discharge Planning (Signed)
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 It wasn't clear if the patient was going to be out of the emergency department in time to make it to his 11:00am appointment today with his pcp. Appointment was rescheduled for July 28, 2013 at 11:00, patient aware of the new appointment. [facility] Case manager will follow up with the patient once discharged.

## 2013-07-19 ENCOUNTER — Institutional Professional Consult (permissible substitution): Payer: No Typology Code available for payment source | Admitting: Pulmonary Disease

## 2013-07-21 NOTE — ED Provider Notes (Signed)
Medical screening examination/treatment/procedure(s) were performed by non-physician practitioner and as supervising physician I was immediately available for consultation/collaboration.   EKG Interpretation   Date/Time:  Tuesday July 18 2013 07:57:38 EDT Ventricular Rate:  90 PR Interval:  161 QRS Duration: 92 QT Interval:  340 QTC Calculation: 416 R Axis:   79 Text Interpretation:  Sinus rhythm No significant change since last  tracing Confirmed by Canary Brim  MD, MARTHA [phone]) on 07/18/2013 3:30:04 PM       Threasa Beards, MD 07/21/13 1504

## 2013-07-27 ENCOUNTER — Ambulatory Visit (INDEPENDENT_AMBULATORY_CARE_PROVIDER_SITE_OTHER): Payer: No Typology Code available for payment source | Admitting: Internal Medicine

## 2013-07-27 ENCOUNTER — Encounter: Payer: Self-pay | Admitting: Internal Medicine

## 2013-07-27 VITALS — BP 126/76 | HR 110 | Ht 69.0 in | Wt 385.6 lb

## 2013-07-27 DIAGNOSIS — G478 Other sleep disorders: Secondary | ICD-10-CM

## 2013-07-27 DIAGNOSIS — J449 Chronic obstructive pulmonary disease, unspecified: Secondary | ICD-10-CM

## 2013-07-27 DIAGNOSIS — G4730 Sleep apnea, unspecified: Secondary | ICD-10-CM

## 2013-07-27 DIAGNOSIS — J9601 Acute respiratory failure with hypoxia: Secondary | ICD-10-CM

## 2013-07-27 DIAGNOSIS — R609 Edema, unspecified: Secondary | ICD-10-CM

## 2013-07-27 DIAGNOSIS — G4733 Obstructive sleep apnea (adult) (pediatric): Secondary | ICD-10-CM

## 2013-07-27 DIAGNOSIS — J9600 Acute respiratory failure, unspecified whether with hypoxia or hypercapnia: Secondary | ICD-10-CM

## 2013-07-27 DIAGNOSIS — I272 Pulmonary hypertension, unspecified: Secondary | ICD-10-CM

## 2013-07-27 DIAGNOSIS — I2789 Other specified pulmonary heart diseases: Secondary | ICD-10-CM

## 2013-07-27 NOTE — Patient Instructions (Signed)
Order- Schedule NPSG split protocol    Dx OSA  Order- schedule PFT   Dx hypoxic respiratory failure  Order- Schedule 2D echocardiogram    Pulmonary hypertension  Walk oximetry room air  Order- schedule ONOX room air      Dx hypoxic respiratory failure

## 2013-07-27 NOTE — Progress Notes (Signed)
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 He quit smoking over 5 years ago. Describes adult onset asthma with no history of pneumonia or heart disease and no history of blood clots in the family. He is treated for hypertension and diabetes. He describes cough most days, productive of thick white phlegm with no blood. He sometimes notices a little wheeze. Dyspnea with any sustained walking, especially briskly or on hills or stairs. He has taken prednisone  Intermittently with most recent taper ending one week ago. Using albuterol rescue inhaler and Qvar 40. He does not notice reflux or significant nasal congestion or drainage but admits he may have some seasonal nasal congestion especially spring and fall. Question of sleep apnea-loud snoring for years, daytime sleepiness. He has had a loud breathing noise in his throat for the past 2 years CT chest 05/15/13  IMPRESSION:  No evidence of pulmonary embolus.  Enlarged left thyroid lobe with presumed focal nodule/mass. This  could be further evaluated with thyroid ultrasound.  No acute findings in the chest.  Electronically Signed  By: [name] M.D.  On: 05/15/2013 16:52   Prior to Admission medications   Medication Sig Start Date End Date Taking? Authorizing Provider  albuterol (PROVENTIL HFA;VENTOLIN HFA) 108 (90 BASE) MCG/ACT inhaler Inhale 2 puffs into the lungs every 6 (six) hours as needed for wheezing or shortness of breath. 07/18/13  Yes Jennifer L Piepenbrink, PA-C  beclomethasone (QVAR) 40 MCG/ACT inhaler Inhale 2 puffs into the lungs daily. 07/18/13  Yes Jennifer L Piepenbrink, PA-C  Cholecalciferol (VITAMIN D3) 1000 UNITS CAPS Take 1 capsule by mouth daily.   Yes Historical Provider, MD  fluticasone (FLONASE) 50 MCG/ACT nasal spray Place 2 sprays into both nostrils daily.   Yes Historical Provider, MD   lisinopril-hydrochlorothiazide (PRINZIDE,ZESTORETIC) 20-25 MG per tablet Take 1 tablet by mouth daily. 07/18/13  Yes Jennifer L Piepenbrink, PA-C  metFORMIN (GLUCOPHAGE) 500 MG tablet Take 1 tablet (500 mg total) by mouth daily with breakfast. 07/18/13  Yes Jennifer L Piepenbrink, PA-C  naproxen (NAPROSYN) 500 MG tablet Take 1 tablet (500 mg total) by mouth 2 (two) times daily with a meal. 07/18/13  Yes Jennifer L Piepenbrink, PA-C  Saline (SIMPLY SALINE) 0.9 % AERS Place 2 sprays into the nose daily as needed (for allergies).   Yes Historical Provider, MD   Past Medical History  Diagnosis Date  . Asthma   . Hypertension   . Diabetes mellitus without complication   . Sleep apnea    History reviewed. No pertinent past surgical history. Family History  Problem Relation Age of Onset  . Asthma Sister   . Asthma Other     nephew  . Hypertension Sister   . Diabetes type II Sister    History   Social History  . Marital Status: Legally Separated    Spouse Name: N/A    Number of Children: 1  . Years of Education: N/A   Occupational History  . unemployed     working on [place]  . Smoking status: Former Smoker -- 0.50 packs/day for .5 years    Types: Cigarettes    Start date: 07/28/1983    Quit date: 02/27/2013  . Smokeless tobacco: Not on file  . Alcohol Use: No     Comment: quit ETOH about 5 months ago-beer and liquor  . Drug Use: No  Comment: quit 6 months-"pot"-smoked couple joints a week  . Sexual Activity: Not on file   Other Topics Concern  . Not on file   Social History Narrative  . No narrative on file   ROS-see HPI Constitutional:   No-   weight loss, night sweats, fevers, chills, fatigue, lassitude. HEENT:   No-  headaches, difficulty swallowing, tooth/dental problems, sore throat,       No-  sneezing, itching, ear ache, nasal congestion, post nasal drip,  CV:  No-   chest pain, orthopnea, PND, +swelling in lower extremities,  no-anasarca,                                  dizziness, palpitations Resp: +shortness of breath with exertion or at rest.              No-   productive cough,  + non-productive cough,  No- coughing up of blood.              No-   change in color of mucus.  No- wheezing.   Skin: No-   rash or lesions. GI:  No-   heartburn, indigestion, +abdominal pain,no- nausea, vomiting, diarrhea,                 change in bowel habits, loss of appetite GU: No-   dysuria, change in color of urine, no urgency or frequency.  No- flank pain. MS:  + joint pain or swelling.  No- decreased range of motion.  No- back pain. Neuro-     nothing unusual Psych:  No- change in mood or affect. No depression or anxiety.  No memory loss.  OBJ- Physical Exam General- Alert, Oriented, Affect-appropriate, Distress- none acute, + morbidly obese Skin- rash-none, lesions- none, excoriation- none Lymphadenopathy- none Head- atraumatic            Eyes- Gross vision intact, PERRLA, conjunctivae and secretions clear            Ears- Hearing, canals-normal            Nose- Clear, no-Septal dev, mucus, polyps, erosion, perforation             Throat- Mallampati IV , mucosa clear , drainage- none, tonsils- atrophic, + stridorous                          Breathing, normal voice quality Neck- +very thick , trachea midline,  Stridor+ , thyroid nl, carotid no bruit Chest - symmetrical excursion , unlabored           Heart/CV- RRR , no murmur , no gallop  , no rub, nl s1 s2                           - JVD- none , edema+1, stasis changes- none, varices- none           Lung- clear to P&A, wheeze- none, cough- none , dullness-none, rub- none           Chest wall-  Abd- tender-no, distended-no, bowel sounds-present, HSM- no Br/ Gen/ Rectal- Not done, not indicated Extrem- cyanosis- none, clubbing, none, atrophy- none, strength- nl Neuro- grossly intact to observation

## 2013-07-29 DIAGNOSIS — R609 Edema, unspecified: Secondary | ICD-10-CM | POA: Insufficient documentation

## 2013-07-29 DIAGNOSIS — R600 Localized edema: Secondary | ICD-10-CM | POA: Insufficient documentation

## 2013-07-29 DIAGNOSIS — G4733 Obstructive sleep apnea (adult) (pediatric): Secondary | ICD-10-CM | POA: Insufficient documentation

## 2013-07-29 DIAGNOSIS — J449 Chronic obstructive pulmonary disease, unspecified: Secondary | ICD-10-CM | POA: Insufficient documentation

## 2013-07-29 NOTE — Assessment & Plan Note (Signed)
Very likely to have obesity hypoventilation syndrome Plan-counseling. Offered referral for medical bariatric consultation

## 2013-07-29 NOTE — Assessment & Plan Note (Signed)
Plan-schedule polys

## 2013-07-29 NOTE — Assessment & Plan Note (Signed)
Plan-walk test on room air for oxygen saturation, overnight oximetry. Consider echocardiogram and arterial blood gas

## 2013-08-01 ENCOUNTER — Ambulatory Visit (HOSPITAL_COMMUNITY)
Admission: RE | Admit: 2013-08-01 | Discharge: 2013-08-01 | Disposition: A | Discharge status: Home / Self Care | Payer: No Typology Code available for payment source | Source: Ambulatory Visit | Attending: Internal Medicine | Admitting: Internal Medicine

## 2013-08-01 DIAGNOSIS — I272 Pulmonary hypertension, unspecified: Secondary | ICD-10-CM

## 2013-08-08 ENCOUNTER — Ambulatory Visit (HOSPITAL_BASED_OUTPATIENT_CLINIC_OR_DEPARTMENT_OTHER): Payer: No Typology Code available for payment source | Attending: Internal Medicine | Admitting: Radiology

## 2013-08-08 VITALS — Ht 69.0 in | Wt 385.0 lb

## 2013-08-08 DIAGNOSIS — G4733 Obstructive sleep apnea (adult) (pediatric): Secondary | ICD-10-CM

## 2013-08-08 DIAGNOSIS — R0989 Other specified symptoms and signs involving the circulatory and respiratory systems: Secondary | ICD-10-CM | POA: Insufficient documentation

## 2013-08-08 DIAGNOSIS — Z9989 Dependence on other enabling machines and devices: Secondary | ICD-10-CM

## 2013-08-08 DIAGNOSIS — I491 Atrial premature depolarization: Secondary | ICD-10-CM | POA: Insufficient documentation

## 2013-08-08 DIAGNOSIS — I4949 Other premature depolarization: Secondary | ICD-10-CM | POA: Insufficient documentation

## 2013-08-08 DIAGNOSIS — R0609 Other forms of dyspnea: Secondary | ICD-10-CM | POA: Insufficient documentation

## 2013-08-12 DIAGNOSIS — G4733 Obstructive sleep apnea (adult) (pediatric): Secondary | ICD-10-CM

## 2013-08-12 NOTE — Sleep Study (Signed)
   NAME: Timothy Le DATE OF BIRTH:  1960-05-01 MEDICAL RECORD NUMBER [id number]  LOCATION: [facility]  PHYSICIAN: YOUNG,CLINTON D  DATE OF STUDY: 08/08/2013  SLEEP STUDY TYPE: Nocturnal Polysomnogram               REFERRING PHYSICIAN: Baird Lyons D, MD  INDICATION FOR STUDY: Hypersomnia with sleep apnea  EPWORTH SLEEPINESS SCORE:   11/24 HEIGHT: 5\' 9"  (175.3 cm)  WEIGHT: 174.635 kg (385 lb)    Body mass index is 56.83 kg/(m^2).  NECK SIZE: 22 in.  MEDICATIONS: Charted for review  SLEEP ARCHITECTURE: Split study protocol. During the diagnostic phase, total sleep time 122 minutes sleep efficiency 49.6%. Stage I was 14.8%, stage II 82%, stage III 3.3%, REM absent. Sleep latency 96.5 minutes, awake after sleep onset 25.5 minutes, arousal index 31, bedtime medication: None. Sleep onset 11:45 PM  RESPIRATORY DATA: Apnea hypopneas index (AHI) 46.7 per hour. 95 total events scored including 17 obstructive apneas and 78 hypopneas. Events were not positional. CPAP titrated to 20 CWP, AHI 0 per hour. He wore a large fullface mask.  OXYGEN DATA: Moderately loud snoring for CPAP with oxygen desaturation to a nadir of 80% on room air. With CPAP control snoring was prevented and mean oxygen saturation of 91% on room air.  CARDIAC DATA: Sinus rhythm with occasional PVC and PAC  MOVEMENT/PARASOMNIA: No significant movement disturbance, no bathroom trips  IMPRESSION/ RECOMMENDATION:   1) Severe obstructive sleep apnea/hypoxia syndrome, AHI 21.1 per hour with non-positional events. Moderate to loud snoring with oxygen desaturation to a nadir of 80% on room air. 2) Successful CPAP titration to 20 CWP, AHI 0 per hour. He wore a large [employer] Simplus fullface mask with heated humidifier. Snoring was prevented and mean oxygen saturation of 91% on room air.   Deneise Lever Diplomate, American Board of Sleep Medicine  ELECTRONICALLY SIGNED ON:  08/12/2013, 2:56  PM [place] PH: [phone]   FX: [phone] [place]

## 2013-09-06 ENCOUNTER — Telehealth: Payer: Self-pay | Admitting: Internal Medicine

## 2013-09-06 NOTE — Telephone Encounter (Signed)
Pt called stating he has not heard anything about his ONO test -to have it set up; was going though [facility]. I spoke with Suanne Marker who called iris at [facility] and confirmed they have been trying to reach the patient several times without success. I called and left message for patient about the above information and told him to expect a call tomorrow from [facility] to set up test-told pt that their phone number may come up as unknown but he needed to answer his phone. I patient has questions or concerns he is to call our office.

## 2013-09-08 NOTE — Telephone Encounter (Signed)
Timothy Le received a call from [facility] at [facility] on Thursday 09-07-13 stating the patient can not have [facility] there where he is staying as he is in a "locked room". Therefore the ONO can not be done. Per CY-okay to cancel the order and wait for patient to come to his 09-21-13 appt at 1pm for PFT and 2pm for ROV with CY. Maudie Mercury has been notified of this update in orders.

## 2013-09-14 ENCOUNTER — Ambulatory Visit: Payer: No Typology Code available for payment source | Attending: Podiatry

## 2013-09-21 ENCOUNTER — Encounter: Payer: Self-pay | Admitting: Internal Medicine

## 2013-09-21 ENCOUNTER — Ambulatory Visit (INDEPENDENT_AMBULATORY_CARE_PROVIDER_SITE_OTHER): Payer: No Typology Code available for payment source | Admitting: Internal Medicine

## 2013-09-21 VITALS — BP 124/72 | HR 114 | Ht 69.0 in | Wt 395.2 lb

## 2013-09-21 DIAGNOSIS — Z23 Encounter for immunization: Secondary | ICD-10-CM

## 2013-09-21 DIAGNOSIS — G4733 Obstructive sleep apnea (adult) (pediatric): Secondary | ICD-10-CM

## 2013-09-21 DIAGNOSIS — J9600 Acute respiratory failure, unspecified whether with hypoxia or hypercapnia: Secondary | ICD-10-CM

## 2013-09-21 DIAGNOSIS — J9601 Acute respiratory failure with hypoxia: Secondary | ICD-10-CM

## 2013-09-21 DIAGNOSIS — R609 Edema, unspecified: Secondary | ICD-10-CM

## 2013-09-21 LAB — PULMONARY FUNCTION TEST
DL/VA % pred: 111 %
DL/VA: 5.12 ml/min/mmHg/L
DLCO UNC: 21.33 ml/min/mmHg
DLCO unc % pred: 68 %
FEF 25-75 Post: 2.42 L/sec
FEF 25-75 Pre: 0.62 L/sec
FEF2575-%CHANGE-POST: 289 %
FEF2575-%PRED-POST: 76 %
FEF2575-%PRED-PRE: 19 %
FEV1-%Change-Post: 18 %
FEV1-%PRED-POST: 59 %
FEV1-%PRED-PRE: 50 %
FEV1-POST: 1.91 L
FEV1-Pre: 1.6 L
FEV1FVC-%CHANGE-POST: 11 %
FEV1FVC-%PRED-PRE: 92 %
FEV6-%CHANGE-POST: 6 %
FEV6-%Pred-Post: 59 %
FEV6-%Pred-Pre: 55 %
FEV6-Post: 2.31 L
FEV6-Pre: 2.16 L
FEV6FVC-%Change-Post: 0 %
FEV6FVC-%PRED-POST: 102 %
FEV6FVC-%Pred-Pre: 102 %
FVC-%CHANGE-POST: 6 %
FVC-%Pred-Post: 57 %
FVC-%Pred-Pre: 54 %
FVC-Post: 2.32 L
FVC-Pre: 2.18 L
POST FEV1/FVC RATIO: 82 %
PRE FEV1/FVC RATIO: 74 %
Post FEV6/FVC ratio: 100 %
Pre FEV6/FVC Ratio: 99 %
RV % pred: 80 %
RV: 1.62 L
TLC % pred: 69 %
TLC: 4.7 L

## 2013-09-21 MED ORDER — FUROSEMIDE 20 MG PO TABS: 20.0000 mg | ORAL_TABLET | Freq: Every day | ORAL | Status: DC

## 2013-09-21 NOTE — Progress Notes (Signed)
07/27/13- 65 yoM former smoker referred by  Juluis Mire, NP at [facility] Medicine at [facility]; COPD Patient reports concerns with his breathing in general, possible COPD, possible sleep apnea. He quit smoking over 5 years ago. Describes adult onset asthma with no history of pneumonia or heart disease and no history of blood clots in the family. He is treated for hypertension and diabetes. He describes cough most days, productive of thick white phlegm with no blood. He sometimes notices a little wheeze. Dyspnea with any sustained walking, especially briskly or on hills or stairs. He has taken prednisone  Intermittently with most recent taper ending one week ago. Using albuterol rescue inhaler and Qvar 40. He does not notice reflux or significant nasal congestion or drainage but admits he may have some seasonal nasal congestion especially spring and fall. Question of sleep apnea-loud snoring for years, daytime sleepiness. He has had a loud breathing noise in his throat for the past 2 years CT chest 05/15/13  IMPRESSION:  No evidence of pulmonary embolus.  Enlarged left thyroid lobe with presumed focal nodule/mass. This  could be further evaluated with thyroid ultrasound.  No acute findings in the chest.  Electronically Signed  By: [name] M.D.  On: 05/15/2013 16:52   09/21/13- 42 yoM former smoker followed for hx COPD, ? OSA, complicated by morbid obesity FOLLOWS FOR:  pt had PFT done today.  legs are aching and swelling--same issues since last OV NPSG 08/08/13- Severe OSA, AHI 46.7/ hr, CPAP to 20 cwp, Weight 385 lbs. PFT 09/21/13- Moderate restriction c/w OHS. FVC 2.32/ 57%, FEV1 1.91/ 59%, FEV1/FVC 0.82,  FEF25-75% 2.42/76%-signif response to BD only in small airways, TLC 69%, DLCO 68%  ROS-see HPI Constitutional:   No-   weight loss, night sweats, fevers, chills, fatigue, lassitude. HEENT:   No-  headaches, difficulty swallowing, tooth/dental problems, sore throat,       No-  sneezing, itching,  ear ache, nasal congestion, post nasal drip,  CV:  No-   chest pain, +orthopnea, PND, +swelling in lower extremities, no-anasarca,                                  dizziness, palpitations Resp: +shortness of breath with exertion or at rest.              No-   productive cough,  + non-productive cough,  No- coughing up of blood.              No-   change in color of mucus.  No- wheezing.   Skin: No-   rash or lesions. GI:  No-   heartburn, indigestion, +abdominal pain,no- nausea, vomiting,  GU:  MS:  + joint pain or swelling.  . Neuro-     nothing unusual Psych:  No- change in mood or affect. No depression or anxiety.  No memory loss.  OBJ- Physical Exam General- Alert, Oriented, Affect-appropriate, Distress- none acute, + morbidly obese Skin- rash-none, lesions- none, excoriation- none Lymphadenopathy- none Head- atraumatic            Eyes- Gross vision intact, PERRLA, conjunctivae and secretions clear            Ears- Hearing, canals-normal            Nose- Clear, no-Septal dev, mucus, polyps, erosion, perforation             Throat- Mallampati IV , mucosa clear , drainage-  none, tonsils- atrophic, + stridorous                          Breathing, normal voice quality Neck- +very thick , trachea midline,  Stridor+ , thyroid nl, carotid no bruit Chest - symmetrical excursion , unlabored           Heart/CV- RRR , no murmur , no gallop  , no rub, nl s1 s2                           - JVD- none , edema+2, stasis changes- none, varices- none           Lung- clear to P&A, wheeze- none, cough- none , dullness-none, rub- none           Chest wall-  Abd-  Br/ Gen/ Rectal- Not done, not indicated Extrem- cyanosis- none, clubbing, none, atrophy- none, strength- nl Neuro- grossly intact to observation

## 2013-09-21 NOTE — Progress Notes (Signed)
PFT done today. 

## 2013-09-21 NOTE — Assessment & Plan Note (Signed)
Educated on sleep apnea. He has no finances and lives at [facility] where he can't have even a Equities trader into his room. Plan- try to get him a donated machine-[facility]. Start at 20 cwp if tolerated.

## 2013-09-21 NOTE — Assessment & Plan Note (Signed)
Reflects morbid obesity Plan- lasix 20 qd.

## 2013-09-21 NOTE — Assessment & Plan Note (Signed)
Probable obesity hypoventilation.  Plan- gentle diuresis and watch response to CPAP. Hopefully he will begin to function a little better and we can get him to bariatric help.

## 2013-09-21 NOTE — Patient Instructions (Addendum)
Order- new DME new CPAP 20 cwp, mask of choice, humidifier, supplies, dx OSA, AHI 46.7/ hr  Script - lasix/ furosemide diuretic sent to drug store

## 2013-09-22 ENCOUNTER — Telehealth: Payer: Self-pay | Admitting: Internal Medicine

## 2013-09-22 MED ORDER — FUROSEMIDE 20 MG PO TABS: 20.0000 mg | ORAL_TABLET | Freq: Every day | ORAL | Status: DC

## 2013-09-22 NOTE — Telephone Encounter (Signed)
I called spoke with pt. Aware I have re faxed RX to them. Nothing further needed

## 2013-11-13 ENCOUNTER — Ambulatory Visit (INDEPENDENT_AMBULATORY_CARE_PROVIDER_SITE_OTHER): Payer: Self-pay | Admitting: Internal Medicine

## 2013-11-13 ENCOUNTER — Encounter: Payer: Self-pay | Admitting: Internal Medicine

## 2013-11-13 VITALS — BP 126/80 | HR 118 | Ht 68.0 in | Wt >= 6400 oz

## 2013-11-13 DIAGNOSIS — G4733 Obstructive sleep apnea (adult) (pediatric): Secondary | ICD-10-CM

## 2013-11-13 DIAGNOSIS — R609 Edema, unspecified: Secondary | ICD-10-CM

## 2013-11-13 DIAGNOSIS — J449 Chronic obstructive pulmonary disease, unspecified: Secondary | ICD-10-CM

## 2013-11-13 NOTE — Progress Notes (Signed)
07/27/13- 81 yoM former smoker referred by  Juluis Mire, NP at [facility] Medicine at [facility]; COPD Patient reports concerns with his breathing in general, possible COPD, possible sleep apnea. He quit smoking over 5 years ago. Describes adult onset asthma with no history of pneumonia or heart disease and no history of blood clots in the family. He is treated for hypertension and diabetes. He describes cough most days, productive of thick white phlegm with no blood. He sometimes notices a little wheeze. Dyspnea with any sustained walking, especially briskly or on hills or stairs. He has taken prednisone  Intermittently with most recent taper ending one week ago. Using albuterol rescue inhaler and Qvar 40. He does not notice reflux or significant nasal congestion or drainage but admits he may have some seasonal nasal congestion especially spring and fall. Question of sleep apnea-loud snoring for years, daytime sleepiness. He has had a loud breathing noise in his throat for the past 2 years CT chest 05/15/13  IMPRESSION:  No evidence of pulmonary embolus.  Enlarged left thyroid lobe with presumed focal nodule/mass. This  could be further evaluated with thyroid ultrasound.  No acute findings in the chest.  Electronically Signed  By: [name] M.D.  On: 05/15/2013 16:52   09/21/13- 44 yoM former smoker followed for hx COPD, ? OSA, complicated by morbid obesity FOLLOWS FOR:  pt had PFT done today.  legs are aching and swelling--same issues since last OV NPSG 08/08/13- Severe OSA, AHI 46.7/ hr, CPAP to 20 cwp, Weight 385 lbs. PFT 09/21/13- Moderate restriction c/w OHS. FVC 2.32/ 57%, FEV1 1.91/ 59%, FEV1/FVC 0.82,  FEF25-75% 2.42/76%-signif response to BD only in small airways, TLC 69%, DLCO 68%  11/13/13- 38 yoM former smoker followed for hx COPD, ? OSA, complicated by morbid obesity FOLLOW FOR:  OSA; CPAP  wears every night approx 8 hours nightly; Pt complains of SOB, wheezing, coughing when he takes off  CPAP, mouth is dry; Pt has disability papers he wants to discuss- just an infomational letter. His PCP stopped lasix, apparently concerned over dried. Dry mouth is  Bothersome.  ROS-see HPI Constitutional:   No-   weight loss, night sweats, fevers, chills, fatigue, lassitude. HEENT:   No-  headaches, difficulty swallowing, tooth/dental problems, sore throat,       No-  sneezing, itching, ear ache, nasal congestion, post nasal drip,  CV:  No-   chest pain, +orthopnea, PND, +swelling in lower extremities, no-anasarca,                                  dizziness, palpitations Resp: +shortness of breath with exertion or at rest.              No-   productive cough,  + non-productive cough,  No- coughing up of blood.              No-   change in color of mucus.  + wheezing.   Skin: No-   rash or lesions. GI:  No-   heartburn, indigestion, +abdominal pain,no- nausea, vomiting,  GU:  MS:  + joint pain or swelling.  . Neuro-     nothing unusual Psych:  No- change in mood or affect. No depression or anxiety.  No memory loss.  OBJ- Physical Exam General- Alert, Oriented, Affect-appropriate, Distress- none acute, + morbidly obese. So fat that he has to sprawl back in chair to unload abd from diaphragm  so he can breathe. Skin- rash-none, lesions- none, excoriation- none Lymphadenopathy- none Head- atraumatic            Eyes- Gross vision intact, PERRLA, conjunctivae and secretions clear            Ears- Hearing, canals-normal            Nose- Clear, no-Septal dev, mucus, polyps, erosion, perforation             Throat- Mallampati IV , mucosa clear , drainage- none, tonsils- atrophic, + stridorous                          Breathing, normal voice quality Neck- +very thick , trachea midline,  Stridor+ , thyroid nl, carotid no bruit Chest - symmetrical excursion , unlabored           Heart/CV- RRR , no murmur , no gallop  , no rub, nl s1 s2                           - JVD- none , edema+2-3, stasis  changes- none, varices- none           Lung- clear to P&A, wheeze- none, cough- none , dullness-none, rub- none                            +stertorous resps if he tries to sit forward and talk.            Chest wall-  Abd-  Br/ Gen/ Rectal- Not done, not indicated Extrem- cyanosis- none, clubbing, none, atrophy- none, strength- nl Neuro- grossly intact to observation

## 2013-11-13 NOTE — Patient Instructions (Addendum)
Order- DME overnight oximetry on CPAP   Dx OSA, Obesity hypoventilation  Try otc Biotene for dry mouth  We can continue CPAP

## 2013-11-15 NOTE — Assessment & Plan Note (Signed)
Severe bilateral leg edema with chronic stasis changes. No acute symptoms to suggest DVT. This is mostly secondary to his obesity.

## 2013-11-15 NOTE — Assessment & Plan Note (Signed)
Very limiting. Don't see how he can maintain gainful employment like this. Weight loss counseling incl option for referral to bariatic program.

## 2013-11-15 NOTE — Assessment & Plan Note (Signed)
Describes good compliance and control. Plan- download for pressure compliance

## 2013-11-15 NOTE — Assessment & Plan Note (Signed)
No wheezing heard. Doubt current infection

## 2014-02-13 ENCOUNTER — Ambulatory Visit: Payer: Self-pay | Admitting: Internal Medicine

## 2014-03-05 ENCOUNTER — Ambulatory Visit: Payer: Self-pay | Admitting: Internal Medicine

## 2014-03-29 ENCOUNTER — Encounter: Payer: Self-pay | Admitting: Internal Medicine

## 2014-03-29 ENCOUNTER — Ambulatory Visit (INDEPENDENT_AMBULATORY_CARE_PROVIDER_SITE_OTHER): Payer: Medicaid Other | Admitting: Internal Medicine

## 2014-03-29 VITALS — BP 140/84 | HR 109 | Ht 68.0 in | Wt 397.8 lb

## 2014-03-29 DIAGNOSIS — J9611 Chronic respiratory failure with hypoxia: Secondary | ICD-10-CM

## 2014-03-29 DIAGNOSIS — J4489 Other specified chronic obstructive pulmonary disease: Secondary | ICD-10-CM

## 2014-03-29 DIAGNOSIS — G4733 Obstructive sleep apnea (adult) (pediatric): Secondary | ICD-10-CM

## 2014-03-29 DIAGNOSIS — J449 Chronic obstructive pulmonary disease, unspecified: Secondary | ICD-10-CM

## 2014-03-29 MED ORDER — FLUTICASONE FUROATE-VILANTEROL 100-25 MCG/INH IN AEPB: 1.0000 | INHALATION_SPRAY | Freq: Every day | RESPIRATORY_TRACT | Status: AC

## 2014-03-29 NOTE — Progress Notes (Signed)
07/27/13- 66 yoM former smoker referred by  Juluis Mire, NP at [facility] Medicine at [facility]; COPD Patient reports concerns with his breathing in general, possible COPD, possible sleep apnea. He quit smoking over 5 years ago. Describes adult onset asthma with no history of pneumonia or heart disease and no history of blood clots in the family. He is treated for hypertension and diabetes. He describes cough most days, productive of thick white phlegm with no blood. He sometimes notices a little wheeze. Dyspnea with any sustained walking, especially briskly or on hills or stairs. He has taken prednisone  Intermittently with most recent taper ending one week ago. Using albuterol rescue inhaler and Qvar 40. He does not notice reflux or significant nasal congestion or drainage but admits he may have some seasonal nasal congestion especially spring and fall. Question of sleep apnea-loud snoring for years, daytime sleepiness. He has had a loud breathing noise in his throat for the past 2 years CT chest 05/15/13  IMPRESSION:  No evidence of pulmonary embolus.  Enlarged left thyroid lobe with presumed focal nodule/mass. This  could be further evaluated with thyroid ultrasound.  No acute findings in the chest.  Electronically Signed  By: [name] M.D.  On: 05/15/2013 16:52   09/21/13- 61 yoM former smoker followed for hx COPD, ? OSA, complicated by morbid obesity FOLLOWS FOR:  pt had PFT done today.  legs are aching and swelling--same issues since last OV NPSG 08/08/13- Severe OSA, AHI 46.7/ hr, CPAP to 20 cwp, Weight 385 lbs. PFT 09/21/13- Moderate restriction c/w OHS. FVC 2.32/ 57%, FEV1 1.91/ 59%, FEV1/FVC 0.82,  FEF25-75% 2.42/76%-signif response to BD only in small airways, TLC 69%, DLCO 68%  11/13/13- 43 yoM former smoker followed for hx COPD, ? OSA, complicated by morbid obesity FOLLOW FOR:  OSA; CPAP  wears every night approx 8 hours nightly; Pt complains of SOB, wheezing, coughing when he takes off  CPAP, mouth is dry; Pt has disability papers he wants to discuss- just an infomational letter. His PCP stopped lasix, apparently concerned over dried. Dry mouth is  Bothersome.  03/29/14- 53 yoM former smoker followed for hx COPD,  OSA, complicated by morbid obesity, OHS CPAP 20/ [facility] FOLLOWS FOR: pt was not able to have ONO done due to living matters-can do it now as he has his own house. Pt wears CPAP almost every night. Pt tosses and turns all night-stomach sleeper. DME is [facility], Walk test 03/29/14 desat to 87% room air, 2 laps x 185 feet Expects more short of breath and hot weather  ROS-see HPI Constitutional:   No-   weight loss, night sweats, fevers, chills, fatigue, lassitude. HEENT:   No-  headaches, difficulty swallowing, tooth/dental problems, sore throat,       No-  sneezing, itching, ear ache, nasal congestion, post nasal drip,  CV:  No-   chest pain, +orthopnea, PND, +swelling in lower extremities, no-anasarca,                                  dizziness, palpitations Resp: +shortness of breath with exertion or at rest.              No-   productive cough,  + non-productive cough,  No- coughing up of blood.              No-   change in color of mucus.  + wheezing.   Skin: No-  rash or lesions. GI:  No-   heartburn, indigestion, +abdominal pain,no- nausea, vomiting,  GU:  MS:  + joint pain or swelling.  . Neuro-     nothing unusual Psych:  No- change in mood or affect. No depression or anxiety.  No memory loss.  OBJ- Physical Exam General- Alert, Oriented, Affect-appropriate, Distress- none acute, + morbidly obese. So fat that he has to sprawl back in chair to unload abd from diaphragm so he can breathe. Skin- rash-none, lesions- none, excoriation- none Lymphadenopathy- none Head- atraumatic            Eyes- Gross vision intact, PERRLA, conjunctivae and secretions clear            Ears- Hearing, canals-normal            Nose- Clear, no-Septal dev, mucus, polyps, erosion,  perforation             Throat- Mallampati IV , mucosa clear , drainage- none, tonsils- atrophic,                     + stridorous  breathing, normal voice quality Neck- +very thick , trachea midline,  Stridor+ , thyroid nl, carotid no bruit Chest - symmetrical excursion , unlabored           Heart/CV- RRR , no murmur , no gallop  , no rub, nl s1 s2                           - JVD- none , edema+2-3, stasis changes- none, varices- none           Lung- clear to P&A, wheeze- none, cough- none , dullness-none, rub- none                            + Labored resps if he tries to sit forward and talk.            Chest wall-  Abd-  Br/ Gen/ Rectal- Not done, not indicated Extrem- cyanosis- none, clubbing, none, atrophy- none, strength- nl Neuro- grossly intact to observation

## 2014-03-29 NOTE — Patient Instructions (Signed)
We need to continue CPAP 20/ [facility], all night, every night  Order- download for compliance and pressure   Dx OSA             ONOX on CPAP with room air   Dx chronic hypoxic respiratory failure  Try Biotene otc mouth rinse at night to help with dry mouth  Walk test on room air for oximetry

## 2014-03-31 NOTE — Assessment & Plan Note (Signed)
Obesity hypoventilation syndrome is probably a bigger problem than bronchitis.

## 2014-03-31 NOTE — Assessment & Plan Note (Signed)
CPAP 20/[facility]. Compliance and control seem pretty good. Reemphasized importance of using CPAP all night every night and losing weight. For dry mouth try Biotene

## 2014-03-31 NOTE — Assessment & Plan Note (Signed)
He has not yet looked into bariatric support or weight loss surgery.

## 2014-06-04 ENCOUNTER — Encounter: Payer: Self-pay | Admitting: Internal Medicine

## 2014-06-04 ENCOUNTER — Ambulatory Visit (INDEPENDENT_AMBULATORY_CARE_PROVIDER_SITE_OTHER): Payer: Self-pay | Admitting: Internal Medicine

## 2014-06-04 DIAGNOSIS — G4733 Obstructive sleep apnea (adult) (pediatric): Secondary | ICD-10-CM

## 2014-06-04 NOTE — Progress Notes (Signed)
07/27/13- 28 yoM former smoker referred by  Timothy Mire, NP at [facility] Medicine at [facility]; COPD Patient reports concerns with his breathing in general, possible COPD, possible sleep apnea. He quit smoking over 5 years ago. Describes adult onset asthma with no history of pneumonia or heart disease and no history of blood clots in the family. He is treated for hypertension and diabetes. He describes cough most days, productive of thick white phlegm with no blood. He sometimes notices a little wheeze. Dyspnea with any sustained walking, especially briskly or on hills or stairs. He has taken prednisone  Intermittently with most recent taper ending one week ago. Using albuterol rescue inhaler and Qvar 40. He does not notice reflux or significant nasal congestion or drainage but admits he may have some seasonal nasal congestion especially spring and fall. Question of sleep apnea-loud snoring for years, daytime sleepiness. He has had a loud breathing noise in his throat for the past 2 years CT chest 05/15/13  IMPRESSION:  No evidence of pulmonary embolus.  Enlarged left thyroid lobe with presumed focal nodule/mass. This  could be further evaluated with thyroid ultrasound.  No acute findings in the chest.  Electronically Signed  By: [name] M.D.  On: 05/15/2013 16:52   09/21/13- 25 yoM former smoker followed for hx COPD, ? OSA, complicated by morbid obesity FOLLOWS FOR:  pt had PFT done today.  legs are aching and swelling--same issues since last OV NPSG 08/08/13- Severe OSA, AHI 46.7/ hr, CPAP to 20 cwp, Weight 385 lbs. PFT 09/21/13- Moderate restriction c/w OHS. FVC 2.32/ 57%, FEV1 1.91/ 59%, FEV1/FVC 0.82,  FEF25-75% 2.42/76%-signif response to BD only in small airways, TLC 69%, DLCO 68%  11/13/13- 17 yoM former smoker followed for hx COPD, ? OSA, complicated by morbid obesity FOLLOW FOR:  OSA; CPAP  wears every night approx 8 hours nightly; Pt complains of SOB, wheezing, coughing when he takes off  CPAP, mouth is dry; Pt has disability papers he wants to discuss- just an infomational letter. His PCP stopped lasix, apparently concerned over dried. Dry mouth is  Bothersome.  03/29/14- 53 yoM former smoker followed for hx COPD,  OSA, complicated by morbid obesity, OHS CPAP 20/ [facility] FOLLOWS FOR: pt was not able to have ONO done due to living matters-can do it now as he has his own house. Pt wears CPAP almost every night. Pt tosses and turns all night-stomach sleeper. DME is [facility], Walk test 03/29/14 desat to 87% room air, 2 laps x 185 feet Expects more short of breath in hot weather  06/04/14-  53 yoM former smoker followed for hx COPD,  OSA, complicated by morbid obesity, OHS CPAP 20/ [facility]  Follows For: Pt c/o SOB with weather change, wheezing, prod cough with thick white mucus. Uses CPAP 6-8 hours 4-5 nights a week.  He likes to use CPAP in the daytime sitting upright in the chair. At night he wants to sleep on his stomach and dislikes CPAP then.  ROS-see HPI Constitutional:   No-   weight loss, night sweats, fevers, chills, fatigue, lassitude. HEENT:   No-  headaches, difficulty swallowing, tooth/dental problems, sore throat,       No-  sneezing, itching, ear ache, nasal congestion, post nasal drip,  CV:  No-   chest pain, +orthopnea, PND, +swelling in lower extremities, no-anasarca, dizziness, palpitations Resp: +shortness of breath with exertion or at rest.              No-   productive  cough,  + non-productive cough,  No- coughing up of blood.              No-   change in color of mucus.   wheezing.   Skin: No-   rash or lesions. GI:  No-   heartburn, indigestion, +abdominal pain,no- nausea, vomiting,  GU:  MS:  + joint pain or swelling.  . Neuro-     nothing unusual Psych:  No- change in mood or affect. No depression or anxiety.  No memory loss.  OBJ- Physical Exam General- Alert, Oriented, Affect-appropriate, Distress- none acute,                      + morbidly obese.                + So fat that he has to sprawl back in chair to unload abd from diaphragm so he can breathe. Skin- rash-none, lesions- none, excoriation- none Lymphadenopathy- none Head- atraumatic            Eyes- Gross vision intact, PERRLA, conjunctivae and secretions clear            Ears- Hearing, canals-normal            Nose- Clear, no-Septal dev, mucus, polyps, erosion, perforation             Throat- Mallampati IV , mucosa clear , drainage- none, tonsils- atrophic,                     + stridorous  breathing, normal voice quality Neck- +very thick , trachea midline,  Stridor+ , thyroid nl, carotid no bruit Chest - symmetrical excursion , unlabored           Heart/CV- RRR , no murmur , no gallop  , no rub, nl s1 s2                           - JVD- none , edema+2-3, stasis changes- none, varices- none           Lung- clear to P&A, wheeze- none, cough- none , dullness-none, rub- none                            + Labored resps if he tries to sit forward and talk.            Chest wall-  Abd-  Br/ Gen/ Rectal- Not done, not indicated Extrem- cyanosis- none, clubbing, none, atrophy- none, strength- nl Neuro- grossly intact to observation

## 2014-06-04 NOTE — Patient Instructions (Signed)
Order- My staff will contact [facility] for result of your overnight oximetry and CPAP download  Order- referral to Bariatric program for weight loss counseling  dx morbid obesity  Please try to use your CPAP anytime you are sleeping. The more you have it on, the more it helps you.

## 2014-06-24 NOTE — Assessment & Plan Note (Signed)
We discussed CPAP compliance goals and medical purpose again. Pressure seems appropriate.

## 2014-06-24 NOTE — Assessment & Plan Note (Signed)
Really sad situation. Plan-refer to bariatric program

## 2014-06-29 ENCOUNTER — Encounter: Payer: Self-pay | Admitting: Internal Medicine

## 2014-09-06 ENCOUNTER — Encounter: Payer: Self-pay | Admitting: Internal Medicine

## 2014-09-06 ENCOUNTER — Ambulatory Visit (INDEPENDENT_AMBULATORY_CARE_PROVIDER_SITE_OTHER): Payer: Medicaid Other | Admitting: Internal Medicine

## 2014-09-06 VITALS — BP 126/74 | HR 106 | Ht 68.0 in | Wt 381.0 lb

## 2014-09-06 DIAGNOSIS — R609 Edema, unspecified: Secondary | ICD-10-CM | POA: Diagnosis not present

## 2014-09-06 DIAGNOSIS — G4733 Obstructive sleep apnea (adult) (pediatric): Secondary | ICD-10-CM | POA: Diagnosis not present

## 2014-09-06 NOTE — Progress Notes (Signed)
07/27/13- 41 yoM former smoker referred by  Juluis Mire, NP at [facility] at [facility]; COPD Patient reports concerns with his breathing in general, possible COPD, possible sleep apnea. He quit smoking over 5 years ago. Describes adult onset asthma with no history of pneumonia or heart disease and no history of blood clots in the family. He is treated for hypertension and diabetes. He describes cough most days, productive of thick white phlegm with no blood. He sometimes notices a little wheeze. Dyspnea with any sustained walking, especially briskly or on hills or stairs. He has taken prednisone  Intermittently with most recent taper ending one week ago. Using albuterol rescue inhaler and Qvar 40. He does not notice reflux or significant nasal congestion or drainage but admits he may have some seasonal nasal congestion especially spring and fall. Question of sleep apnea-loud snoring for years, daytime sleepiness. He has had a loud breathing noise in his throat for the past 2 years CT chest 05/15/13  IMPRESSION:  No evidence of pulmonary embolus.  Enlarged left thyroid lobe with presumed focal nodule/mass. This  could be further evaluated with thyroid ultrasound.  No acute findings in the chest.  Electronically Signed  By: [name] M.D.  On: 05/15/2013 16:52   09/21/13- 44 yoM former smoker followed for hx COPD, ? OSA, complicated by morbid obesity FOLLOWS FOR:  pt had PFT done today.  legs are aching and swelling--same issues since last OV NPSG 08/08/13- Severe OSA, AHI 46.7/ hr, CPAP to 20 cwp, Weight 385 lbs. PFT 09/21/13- Moderate restriction c/w OHS. FVC 2.32/ 57%, FEV1 1.91/ 59%, FEV1/FVC 0.82,  FEF25-75% 2.42/76%-signif response to BD only in small airways, TLC 69%, DLCO 68%  11/13/13- 31 yoM former smoker followed for hx COPD, ? OSA, complicated by morbid obesity FOLLOW FOR:  OSA; CPAP  wears every night approx 8 hours nightly; Pt complains of SOB, wheezing, coughing when he takes off  CPAP, mouth is dry; Pt has disability papers he wants to discuss- just an infomational letter. His PCP stopped lasix, apparently concerned over dried. Dry mouth is  Bothersome.  03/29/14- 53 yoM former smoker followed for hx COPD,  OSA, complicated by morbid obesity, OHS CPAP 20/ [facility]PS FOLLOWS FOR: pt was not able to have ONO done due to living matters-can do it now as he has his own house. Pt wears CPAP almost every night. Pt tosses and turns all night-stomach sleeper. DME is [facility], Walk test 03/29/14 desat to 87% room air, 2 laps x 185 feet Expects more short of breath in hot weather  06/04/14-  53 yoM former smoker followed for hx COPD,  OSA, complicated by morbid obesity, OHS CPAP 20/ [facility]  Follows For: Pt c/o SOB with weather change, wheezing, prod cough with thick white mucus. Uses CPAP 6-8 hours 4-5 nights a week.  He likes to use CPAP in the daytime sitting upright in the chair. At night he wants to sleep on his stomach and dislikes CPAP then.  09/06/14- 79 yoM former smoker followed for hx COPD,  OSA, complicated by morbid obesity, OHS CPAP 20/ [facility] FOLLOWS FOR: Wears CPAP20/ [facility] every night for more than 8 hours; not wearing any O2 during day or night. No recent DL from [facility]-will need to order and enroll in [place]. He got a recliner so he can sleep elevated with his CPAP. He is very pleased now, feeling much better, sleeping all night with CPAP. His been able to lose about 25 pounds and is beginning to  make an effort. He had gone for bariatric assessment before but they didn't have him scheduled-unclear where the problem was. ONOX 04/17/24 on CPAP with room air had shown sustained desaturation. Medicare would require an unattended sleep study with CPAP to confirm he is needing supplemental oxygen  ROS-see HPI Constitutional:   + weight loss, night sweats, fevers, chills, fatigue, lassitude. HEENT:   No-  headaches, difficulty swallowing, tooth/dental problems, sore throat,       No-  sneezing,  itching, ear ache, nasal congestion, post nasal drip,  CV:  No-   chest pain, +orthopnea, PND, +swelling in lower extremities, no-anasarca, dizziness, palpitations Resp: +shortness of breath with exertion or at rest.              No-   productive cough,  + non-productive cough,  No- coughing up of blood.              No-   change in color of mucus.   wheezing.   Skin: No-   rash or lesions. GI:  No-   heartburn, indigestion, +abdominal pain,no- nausea, vomiting,  GU:  MS:  + joint pain or swelling.  . Neuro-     nothing unusual Psych:  No- change in mood or affect. No depression or anxiety.  No memory loss.  OBJ- Physical Exam General- Alert, Oriented, Affect-appropriate, Distress- none acute,                      + morbidly obese.               + So fat that he has to sprawl back in chair to unload abd from diaphragm so he can breathe. Skin- rash-none, lesions- none, excoriation- none Lymphadenopathy- none Head- atraumatic            Eyes- Gross vision intact, PERRLA, conjunctivae and secretions clear            Ears- Hearing, canals-normal            Nose- Clear, no-Septal dev, mucus, polyps, erosion, perforation             Throat- Mallampati IV , mucosa clear , drainage- none, tonsils- atrophic,                     + Shallow consistent with body habitus, normal voice quality Neck- +very thick , trachea midline,  Stridor+ , thyroid nl, carotid no bruit Chest - symmetrical excursion , unlabored           Heart/CV- RRR , no murmur , no gallop  , no rub, nl s1 s2                           - JVD- none , edema+2-3, stasis changes- none, varices- none           Lung- clear to P&A, wheeze- none, cough- none , dullness-none, rub- none                            + Labored resps if he tries to sit forward and talk.            Chest wall-  Abd-  Br/ Gen/ Rectal- Not done, not indicated Extrem- cyanosis- none, clubbing, none, atrophy- none, strength- nl Neuro- grossly intact to  observation

## 2014-09-06 NOTE — Patient Instructions (Signed)
You are doing much better. I'm really proud of the weight loss- keep it up!  We can continue CPAP 20  Order- [facility]- download CPAP for pressure compliance,  Establish [facility] if available   Dx OSA  Please call as needed

## 2014-09-07 ENCOUNTER — Telehealth: Payer: Self-pay | Admitting: Internal Medicine

## 2014-09-07 NOTE — Assessment & Plan Note (Signed)
Good start on CPAP 20, utilized in a recliner chair to sleep. He is beginning to feel definitely better. Overnight oximetry with CPAP demonstrates oxygen desaturation. He will probably need another sleep study for CPAP titration on room air to document need for oxygen supplementation at night. We are going to try to let him  lose a bit more weight.

## 2014-09-07 NOTE — Assessment & Plan Note (Signed)
Significant bilateral lower extremity edema secondary to his obesity. He is beginning to diurese him as he loses weight.

## 2014-09-07 NOTE — Telephone Encounter (Signed)
Called spoke with kim w/ [facility] and she will link pt. Nothing further needed

## 2014-09-07 NOTE — Telephone Encounter (Signed)
Received call from [place] at [place].  She said that this patient is in [place] and it does not look as if he has used his CPAP machine in 3 months.  She said that she has placed a call to patient to discuss CPAP with him.  Awaiting patient to call her back.   FYI to [employer].

## 2014-09-07 NOTE — Assessment & Plan Note (Signed)
He has seen some progress losing weight and is encouraged to try harder. I was supportive of that effort today.

## 2014-09-07 NOTE — Telephone Encounter (Signed)
Please let [facility] know that the patient is not showing up in airview for our records-they may have him enrolled on their side but not ours. Thanks.

## 2014-10-04 ENCOUNTER — Encounter: Payer: Self-pay | Admitting: Internal Medicine

## 2014-12-27 ENCOUNTER — Inpatient Hospital Stay (HOSPITAL_COMMUNITY)
Admission: EM | Admit: 2014-12-27 | Discharge: 2014-12-30 | DRG: 190 | Disposition: A | Discharge status: Home / Self Care | Payer: Medicaid Other | Attending: Internal Medicine | Admitting: Internal Medicine

## 2014-12-27 ENCOUNTER — Encounter (HOSPITAL_COMMUNITY): Payer: Self-pay | Admitting: Emergency Medicine

## 2014-12-27 ENCOUNTER — Emergency Department (HOSPITAL_COMMUNITY): Payer: Medicaid Other

## 2014-12-27 DIAGNOSIS — Z6831 Body mass index (BMI) 31.0-31.9, adult: Secondary | ICD-10-CM

## 2014-12-27 DIAGNOSIS — J209 Acute bronchitis, unspecified: Secondary | ICD-10-CM | POA: Diagnosis present

## 2014-12-27 DIAGNOSIS — J441 Chronic obstructive pulmonary disease with (acute) exacerbation: Secondary | ICD-10-CM | POA: Diagnosis present

## 2014-12-27 DIAGNOSIS — Z8249 Family history of ischemic heart disease and other diseases of the circulatory system: Secondary | ICD-10-CM | POA: Diagnosis not present

## 2014-12-27 DIAGNOSIS — J9601 Acute respiratory failure with hypoxia: Secondary | ICD-10-CM | POA: Diagnosis present

## 2014-12-27 DIAGNOSIS — E8770 Fluid overload, unspecified: Secondary | ICD-10-CM | POA: Diagnosis present

## 2014-12-27 DIAGNOSIS — Z833 Family history of diabetes mellitus: Secondary | ICD-10-CM

## 2014-12-27 DIAGNOSIS — I119 Hypertensive heart disease without heart failure: Secondary | ICD-10-CM | POA: Diagnosis present

## 2014-12-27 DIAGNOSIS — E662 Morbid (severe) obesity with alveolar hypoventilation: Secondary | ICD-10-CM | POA: Diagnosis present

## 2014-12-27 DIAGNOSIS — Z794 Long term (current) use of insulin: Secondary | ICD-10-CM | POA: Diagnosis not present

## 2014-12-27 DIAGNOSIS — R0902 Hypoxemia: Secondary | ICD-10-CM

## 2014-12-27 DIAGNOSIS — Z7984 Long term (current) use of oral hypoglycemic drugs: Secondary | ICD-10-CM

## 2014-12-27 DIAGNOSIS — J449 Chronic obstructive pulmonary disease, unspecified: Secondary | ICD-10-CM | POA: Diagnosis present

## 2014-12-27 DIAGNOSIS — Z7951 Long term (current) use of inhaled steroids: Secondary | ICD-10-CM

## 2014-12-27 DIAGNOSIS — E785 Hyperlipidemia, unspecified: Secondary | ICD-10-CM | POA: Diagnosis present

## 2014-12-27 DIAGNOSIS — J9600 Acute respiratory failure, unspecified whether with hypoxia or hypercapnia: Secondary | ICD-10-CM | POA: Diagnosis present

## 2014-12-27 DIAGNOSIS — G4733 Obstructive sleep apnea (adult) (pediatric): Secondary | ICD-10-CM | POA: Diagnosis present

## 2014-12-27 DIAGNOSIS — J440 Chronic obstructive pulmonary disease with acute lower respiratory infection: Secondary | ICD-10-CM | POA: Diagnosis not present

## 2014-12-27 DIAGNOSIS — R0602 Shortness of breath: Secondary | ICD-10-CM

## 2014-12-27 DIAGNOSIS — L8990 Pressure ulcer of unspecified site, unspecified stage: Secondary | ICD-10-CM | POA: Insufficient documentation

## 2014-12-27 DIAGNOSIS — E119 Type 2 diabetes mellitus without complications: Secondary | ICD-10-CM | POA: Diagnosis present

## 2014-12-27 DIAGNOSIS — Z87891 Personal history of nicotine dependence: Secondary | ICD-10-CM | POA: Diagnosis not present

## 2014-12-27 DIAGNOSIS — R0600 Dyspnea, unspecified: Secondary | ICD-10-CM | POA: Diagnosis not present

## 2014-12-27 DIAGNOSIS — J45901 Unspecified asthma with (acute) exacerbation: Secondary | ICD-10-CM | POA: Diagnosis present

## 2014-12-27 DIAGNOSIS — J438 Other emphysema: Secondary | ICD-10-CM | POA: Diagnosis not present

## 2014-12-27 DIAGNOSIS — E1165 Type 2 diabetes mellitus with hyperglycemia: Secondary | ICD-10-CM

## 2014-12-27 DIAGNOSIS — Z6841 Body Mass Index (BMI) 40.0 and over, adult: Secondary | ICD-10-CM

## 2014-12-27 HISTORY — DX: Chronic obstructive pulmonary disease, unspecified: J44.9

## 2014-12-27 LAB — BASIC METABOLIC PANEL
ANION GAP: 6 (ref 5–15)
BUN: 19 mg/dL (ref 6–20)
CALCIUM: 9 mg/dL (ref 8.9–10.3)
CO2: 35 mmol/L — AB (ref 22–32)
CREATININE: 1.13 mg/dL (ref 0.61–1.24)
Chloride: 101 mmol/L (ref 101–111)
GFR calc Af Amer: >60 mL/min (ref >60)
GLUCOSE: 169 mg/dL — AB (ref 65–99)
Potassium: 4.3 mmol/L (ref 3.5–5.1)
Sodium: 142 mmol/L (ref 135–145)

## 2014-12-27 LAB — CBC WITH DIFFERENTIAL/PLATELET
BASOS ABS: 0 10*3/uL (ref 0.0–0.1)
Basophils Relative: 0 %
EOS PCT: 2 %
Eosinophils Absolute: 0.1 10*3/uL (ref 0.0–0.7)
HCT: 42.3 % (ref 39.0–52.0)
Hemoglobin: 12.8 g/dL — ABNORMAL LOW (ref 13.0–17.0)
LYMPHS PCT: 19 %
Lymphs Abs: 1.8 10*3/uL (ref 0.7–4.0)
MCH: 28.6 pg (ref 26.0–34.0)
MCHC: 30.3 g/dL (ref 30.0–36.0)
MCV: 94.4 fL (ref 78.0–100.0)
MONO ABS: 0.4 10*3/uL (ref 0.1–1.0)
MONOS PCT: 5 %
Neutro Abs: 6.8 10*3/uL (ref 1.7–7.7)
Neutrophils Relative %: 74 %
PLATELETS: 279 10*3/uL (ref 150–400)
RBC: 4.48 MIL/uL (ref 4.22–5.81)
RDW: 15.9 % — AB (ref 11.5–15.5)
WBC: 9.2 10*3/uL (ref 4.0–10.5)

## 2014-12-27 LAB — D-DIMER, QUANTITATIVE (NOT AT ARMC): D DIMER QUANT: 0.66 ug{FEU}/mL — AB (ref 0.00–0.50)

## 2014-12-27 LAB — BRAIN NATRIURETIC PEPTIDE: B NATRIURETIC PEPTIDE 5: 27.9 pg/mL (ref 0.0–100.0)

## 2014-12-27 LAB — I-STAT TROPONIN, ED: Troponin i, poc: 0.01 ng/mL (ref 0.00–0.08)

## 2014-12-27 LAB — GLUCOSE, CAPILLARY: Glucose-Capillary: 258 mg/dL — ABNORMAL HIGH (ref 65–99)

## 2014-12-27 MED ORDER — ALBUTEROL SULFATE (2.5 MG/3ML) 0.083% IN NEBU
2.5000 mg | INHALATION_SOLUTION | RESPIRATORY_TRACT | Status: DC
  Administered 2014-12-27: 2.5 mg via RESPIRATORY_TRACT
  Filled 2014-12-27: qty 3

## 2014-12-27 MED ORDER — ALBUTEROL SULFATE (2.5 MG/3ML) 0.083% IN NEBU: 2.5000 mg | INHALATION_SOLUTION | RESPIRATORY_TRACT | Status: DC | PRN

## 2014-12-27 MED ORDER — ALBUTEROL (5 MG/ML) CONTINUOUS INHALATION SOLN
15.0000 mg | INHALATION_SOLUTION | Freq: Once | RESPIRATORY_TRACT | Status: AC
  Administered 2014-12-27: 15 mg via RESPIRATORY_TRACT
  Filled 2014-12-27: qty 20

## 2014-12-27 MED ORDER — SODIUM CHLORIDE 0.9 % IJ SOLN
3.0000 mL | Freq: Two times a day (BID) | INTRAMUSCULAR | Status: DC
  Administered 2014-12-28 – 2014-12-30 (×4): 3 mL via INTRAVENOUS

## 2014-12-27 MED ORDER — ENOXAPARIN SODIUM 80 MG/0.8ML ~~LOC~~ SOLN
80.0000 mg | Freq: Every day | SUBCUTANEOUS | Status: DC
  Administered 2014-12-28 – 2014-12-29 (×3): 80 mg via SUBCUTANEOUS
  Filled 2014-12-27 (×3): qty 0.8

## 2014-12-27 MED ORDER — IPRATROPIUM BROMIDE 0.02 % IN SOLN
0.5000 mg | Freq: Four times a day (QID) | RESPIRATORY_TRACT | Status: DC
  Administered 2014-12-27: 0.5 mg via RESPIRATORY_TRACT
  Filled 2014-12-27: qty 2.5

## 2014-12-27 MED ORDER — DOXYCYCLINE HYCLATE 100 MG IV SOLR
100.0000 mg | Freq: Two times a day (BID) | INTRAVENOUS | Status: DC
  Administered 2014-12-28 – 2014-12-30 (×6): 100 mg via INTRAVENOUS
  Filled 2014-12-27 (×7): qty 100

## 2014-12-27 MED ORDER — AMLODIPINE BESYLATE 10 MG PO TABS
10.0000 mg | ORAL_TABLET | Freq: Every day | ORAL | Status: DC
  Administered 2014-12-28 – 2014-12-30 (×3): 10 mg via ORAL
  Filled 2014-12-27 (×3): qty 1

## 2014-12-27 MED ORDER — GEMFIBROZIL 600 MG PO TABS
600.0000 mg | ORAL_TABLET | Freq: Two times a day (BID) | ORAL | Status: DC
  Administered 2014-12-28 – 2014-12-30 (×5): 600 mg via ORAL
  Filled 2014-12-27 (×7): qty 1

## 2014-12-27 MED ORDER — PANTOPRAZOLE SODIUM 40 MG PO TBEC
40.0000 mg | DELAYED_RELEASE_TABLET | Freq: Every day | ORAL | Status: DC
  Administered 2014-12-28 – 2014-12-30 (×3): 40 mg via ORAL
  Filled 2014-12-27 (×3): qty 1

## 2014-12-27 MED ORDER — FUROSEMIDE 10 MG/ML IJ SOLN
40.0000 mg | Freq: Two times a day (BID) | INTRAMUSCULAR | Status: DC
  Administered 2014-12-28: 40 mg via INTRAVENOUS
  Filled 2014-12-27: qty 4

## 2014-12-27 MED ORDER — BUDESONIDE 0.25 MG/2ML IN SUSP
0.2500 mg | Freq: Two times a day (BID) | RESPIRATORY_TRACT | Status: DC
  Administered 2014-12-27 – 2014-12-30 (×6): 0.25 mg via RESPIRATORY_TRACT
  Filled 2014-12-27 (×7): qty 2

## 2014-12-27 MED ORDER — ONDANSETRON HCL 4 MG/2ML IJ SOLN: 4.0000 mg | Freq: Four times a day (QID) | INTRAMUSCULAR | Status: DC | PRN

## 2014-12-27 MED ORDER — ACETAMINOPHEN 325 MG PO TABS: 650.0000 mg | ORAL_TABLET | Freq: Four times a day (QID) | ORAL | Status: DC | PRN

## 2014-12-27 MED ORDER — ONDANSETRON HCL 4 MG PO TABS: 4.0000 mg | ORAL_TABLET | Freq: Four times a day (QID) | ORAL | Status: DC | PRN

## 2014-12-27 MED ORDER — ALBUTEROL SULFATE (2.5 MG/3ML) 0.083% IN NEBU: 2.5000 mg | INHALATION_SOLUTION | RESPIRATORY_TRACT | Status: DC

## 2014-12-27 MED ORDER — ALBUTEROL SULFATE (2.5 MG/3ML) 0.083% IN NEBU: 2.5000 mg | INHALATION_SOLUTION | Freq: Four times a day (QID) | RESPIRATORY_TRACT | Status: DC

## 2014-12-27 MED ORDER — FLUTICASONE PROPIONATE 50 MCG/ACT NA SUSP
2.0000 | Freq: Every day | NASAL | Status: DC | PRN
  Filled 2014-12-27: qty 16

## 2014-12-27 MED ORDER — HYDROCHLOROTHIAZIDE 25 MG PO TABS
25.0000 mg | ORAL_TABLET | Freq: Every day | ORAL | Status: DC
  Filled 2014-12-27: qty 1

## 2014-12-27 MED ORDER — ACETAMINOPHEN 650 MG RE SUPP
650.0000 mg | Freq: Four times a day (QID) | RECTAL | Status: DC | PRN
Start: 2014-12-27 — End: 2014-12-30

## 2014-12-27 MED ORDER — INSULIN ASPART 100 UNIT/ML ~~LOC~~ SOLN
0.0000 [IU] | Freq: Three times a day (TID) | SUBCUTANEOUS | Status: DC
  Administered 2014-12-28: 1 [IU] via SUBCUTANEOUS

## 2014-12-27 MED ORDER — LISINOPRIL-HYDROCHLOROTHIAZIDE 20-25 MG PO TABS: 1.0000 | ORAL_TABLET | Freq: Every day | ORAL | Status: DC

## 2014-12-27 MED ORDER — LISINOPRIL 20 MG PO TABS
20.0000 mg | ORAL_TABLET | Freq: Every day | ORAL | Status: DC
  Administered 2014-12-28 – 2014-12-30 (×3): 20 mg via ORAL
  Filled 2014-12-27 (×3): qty 1

## 2014-12-27 MED ORDER — ONDANSETRON HCL 4 MG/2ML IJ SOLN: 4.0000 mg | Freq: Three times a day (TID) | INTRAMUSCULAR | Status: DC | PRN

## 2014-12-27 MED ORDER — CYCLOBENZAPRINE HCL 10 MG PO TABS: 10.0000 mg | ORAL_TABLET | Freq: Three times a day (TID) | ORAL | Status: DC | PRN

## 2014-12-27 MED ORDER — IPRATROPIUM-ALBUTEROL 0.5-2.5 (3) MG/3ML IN SOLN
3.0000 mL | Freq: Four times a day (QID) | RESPIRATORY_TRACT | Status: DC
  Administered 2014-12-28 – 2014-12-30 (×9): 3 mL via RESPIRATORY_TRACT
  Filled 2014-12-27 (×10): qty 3

## 2014-12-27 NOTE — ED Notes (Signed)
Took pt off Nasal Cannula per MD request. Will monitor 02 sats.

## 2014-12-27 NOTE — ED Notes (Signed)
Bed: [id number] Expected date:  Expected time:  Means of arrival:  Comments: 54 yo hx COPD, wheezing, increased SOB from PCP office

## 2014-12-27 NOTE — ED Notes (Signed)
Delay in blood draw, pt not in room 

## 2014-12-27 NOTE — ED Notes (Signed)
Pt from PCP office. Hx COPD. Pt reports increased SOB and productive cough for the past week. Pt given 2 duoneb treatments prior to arrival. Pt has audible rhonchi. Was also given 40mg  solu-medrol at PCP office.

## 2014-12-27 NOTE — ED Notes (Signed)
MD at bedside. 

## 2014-12-27 NOTE — H&P (Signed)
[facility] History and Physical  Timothy Le [id number] DOB: January 17, 1961 DOA: 12/27/2014  Referring physician: Dr. Audie Pinto. PCP: Kerin Perna, NP  Specialists: Dr. Baird Lyons. Pulmonologist.  Chief Complaint: Shortness of breath.  HPI: Timothy Le is a 54 y.o. male with history of morbid obesity, diabetes mellitus type 2, hypertension, hyperlipidemia and OSA and COPD/asthma presents to the ER because of worsening shortness of breath. Patient has been having shortness of breath over the last few days which has acutely worsened yesterday. In the ER patient was found to be wheezing and was placed on nebulizer treatment and steroids. Patient states he has gained at least 14 pounds over the last few days. Patient also get short of breath on walking. Denies any chest pain. X-rays revealed vascular congestion. Patient has been admitted for acute respiratory failure probably from a combination of bronchitis and fluid overload.   Review of Systems: As presented in the history of presenting illness, rest negative.  Past Medical History  Diagnosis Date  . Asthma   . Hypertension   . Diabetes mellitus without complication ([place])   . Sleep apnea   . COPD (chronic obstructive pulmonary disease) ([place])    History reviewed. No pertinent past surgical history. Social History:  reports that he quit smoking about 21 months ago. His smoking use included Cigarettes. He started smoking about 31 years ago. He has a .25 pack-year smoking history. He does not have any smokeless tobacco history on file. He reports that he does not drink alcohol or use illicit drugs. Where does patient live home. Can patient participate in ADLs? Yes.  No Known Allergies  Family History:  Family History  Problem Relation Age of Onset  . Asthma Sister   . Asthma Other     nephew  . Hypertension Sister   . Diabetes type II Sister       Prior to Admission medications   Medication Sig Start Date  End Date Taking? Authorizing Provider  albuterol (PROVENTIL HFA;VENTOLIN HFA) 108 (90 BASE) MCG/ACT inhaler Inhale 2 puffs into the lungs every 6 (six) hours as needed for wheezing or shortness of breath. 07/18/13  Yes Jennifer Piepenbrink, PA-C  amLODipine (NORVASC) 10 MG tablet Take 10 mg by mouth daily.   Yes Historical Provider, MD  beclomethasone (QVAR) 40 MCG/ACT inhaler Inhale 2 puffs into the lungs daily. 07/18/13  Yes Jennifer Piepenbrink, PA-C  Cholecalciferol (VITAMIN D3) 1000 UNITS CAPS Take 1 capsule by mouth daily.   Yes Historical Provider, MD  cyclobenzaprine (FLEXERIL) 10 MG tablet Take 10 mg by mouth 3 (three) times daily as needed for muscle spasms.   Yes Historical Provider, MD  fluticasone (FLONASE) 50 MCG/ACT nasal spray Place 2 sprays into both nostrils daily as needed for allergies.    Yes Historical Provider, MD  gemfibrozil (LOPID) 600 MG tablet Take 600 mg by mouth 2 (two) times daily before a meal. Thirty minutes before morning and evening meal.   Yes Historical Provider, MD  lisinopril-hydrochlorothiazide (PRINZIDE,ZESTORETIC) 20-25 MG tablet Take 1 tablet by mouth daily.    Yes Historical Provider, MD  metFORMIN (GLUCOPHAGE) 500 MG tablet Take 1 tablet (500 mg total) by mouth daily with breakfast. Patient taking differently: Take 500 mg by mouth 2 (two) times daily with a meal.  07/18/13  Yes Jennifer Piepenbrink, PA-C  naproxen (NAPROSYN) 500 MG tablet Take 1 tablet (500 mg total) by mouth 2 (two) times daily with a meal. Patient taking differently: Take 500-1,000 mg by mouth daily.  07/18/13  Yes Jennifer Piepenbrink, PA-C  omeprazole (PRILOSEC) 20 MG capsule Take 20 mg by mouth daily as needed (for acid reflex).    Yes Historical Provider, MD  Saline (SIMPLY SALINE) 0.9 % AERS Place 2 sprays into the nose daily as needed (for allergies).   Yes Historical Provider, MD  triamcinolone ointment (KENALOG) 0.1 % Apply 1 application topically 3 (three) times daily.   Yes  Historical Provider, MD    Physical Exam: Filed Vitals:   12/27/14 2015 12/27/14 2055 12/27/14 2059 12/27/14 2200  BP: 141/80  141/71   Pulse: 111  112   Temp:   98 F (36.7 C)   TempSrc:   Oral   Resp: 32  24   Height:  5\' 8"  (1.727 m)    Weight:  182.119 kg (401 lb 8 oz)    SpO2: 97%  96% 94%     General:  Obese not in distress.  Eyes: Anicteric no pallor.  ENT: No discharge from the ears eyes nose and mouth.  Neck: JVD not able to be measured. No mass felt.  Cardiovascular: S1 and S2 heard.  Respiratory: No rhonchi or crepitations.  Abdomen: Soft nontender bowel sounds present.  Skin: Chronic skin changes.  Musculoskeletal: Mild edema.  Psychiatric: Appears normal.  Neurologic: Alert awake oriented to time place and person. Moves all extremities.  Labs on Admission:  Basic Metabolic Panel:  Recent Labs Lab 12/27/14 1658  NA 142  K 4.3  CL 101  CO2 35*  GLUCOSE 169*  BUN 19  CREATININE 1.13  CALCIUM 9.0   Liver Function Tests: No results for input(s): AST, ALT, ALKPHOS, BILITOT, PROT, ALBUMIN in the last 168 hours. No results for input(s): LIPASE, AMYLASE in the last 168 hours. No results for input(s): AMMONIA in the last 168 hours. CBC:  Recent Labs Lab 12/27/14 1658  WBC 9.2  NEUTROABS 6.8  HGB 12.8*  HCT 42.3  MCV 94.4  PLT 279   Cardiac Enzymes: No results for input(s): CKTOTAL, CKMB, CKMBINDEX, TROPONINI in the last 168 hours.  BNP (last 3 results)  Recent Labs  12/27/14 1659  BNP 27.9    ProBNP (last 3 results) No results for input(s): PROBNP in the last 8760 hours.  CBG:  Recent Labs Lab 12/27/14 2159  GLUCAP 258*    Radiological Exams on Admission: Dg Chest 2 View  12/27/2014  CLINICAL DATA:  Severe shortness of breath with chest pain. Bad cough. History of diabetes and hypertension. EXAM: CHEST  2 VIEW COMPARISON:  Radiographs and CT 05/15/2013. FINDINGS: Cardiomegaly, vascular congestion and bibasilar  atelectasis are similar to the prior examination. There is no confluent airspace opacity or significant pleural effusion. Some breathing artifact is present on the lateral view. There are diffuse osteophytes within the thoracic spine consistent with diffuse idiopathic skeletal hyperostosis. Occult spinal dysraphism noted in the lower cervical and upper thoracic spine. IMPRESSION: Stable cardiomegaly, vascular congestion and chronic atelectasis. No acute findings. Electronically Signed   By: Richardean Sale M.D.   On: 12/27/2014 16:34     Assessment/Plan Principal Problem:   Acute respiratory failure with hypoxia (HCC) Active Problems:   Obstructive sleep apnea   Asthma   Morbid obesity ([place])   Diabetes mellitus type 2, controlled ([place])   1. Acute respiratory failure with hypoxia - suspect a combination of bronchitis with fluid overload given the patient also has gained weight recently. I'm continuing with nebulizer Pulmicort and also place patient on Lasix. We will check d-dimer  is elevated we will get CT angiogram of the chest. Check 2-D echo. Closely follow intake and output metabolic panel and daily weights. On my exam patient's wheezing has improved and I have not continued the IV steroids which may be needed if patient continues to wheeze. 2. Diabetes mellitus type 2 - while inpatient patient will be on sliding scale coverage. Hold metformin. 3. Hypertension - patient is on lisinopril and HCTZ which will be continued. 4. OSA - on C Pap. 5. Hyperlipidemia on gemfibrozil.  I have reviewed patient's old charts and labs. Personally reviewed patient's chest x-ray.   DVT Prophylaxis Lovenox.  Code Status: Full code.  Family Communication: Discussed with patient.  Disposition Plan: Admit to inpatient.    Ricky Gallery N. [facility] Pager [phone].  If 7PM-7AM, please contact night-coverage [email or web address] Password [email or web address] 12/27/2014, 10:11 PM

## 2014-12-27 NOTE — Progress Notes (Signed)
Lovenox per Pharmacy for DVT Prophylaxis    Pharmacy has been consulted from dosing enoxaparin (lovenox) in this patient for DVT prophylaxis.  The pharmacist has reviewed pertinent labs (Hgb _12.8__; PLT_279__), patient weight (_182__kg) and renal function (CrCl_>90__mL/min) and decided that enoxaparin _80_mg SQ Q_24_Hrs is appropriate for this patient.  The pharmacy department will sign off at this time.  Please reconsult pharmacy if status changes or for further issues.  Thank you  Cyndia Diver PharmD, BCPS  12/27/2014, 10:18 PM

## 2014-12-27 NOTE — ED Notes (Signed)
Pt's O2 sat dropped to 88% on RA. Returned pt to 2L [place]

## 2014-12-27 NOTE — ED Provider Notes (Signed)
CSN: [id number]     Arrival date & time 12/27/14  1547 History   First MD Initiated Contact with Patient 12/27/14 1558     Chief Complaint  Patient presents with  . Shortness of Breath     HPI Pt from PCP office. Hx COPD. Pt reports increased SOB and productive cough for the past week. Pt given 2 duoneb treatments prior to arrival. Pt has audible rhonchi. Was also given 40mg  solu-medrol at PCP office.  Past Medical History  Diagnosis Date  . Asthma   . Hypertension   . Diabetes mellitus without complication ([place])   . Sleep apnea   . COPD (chronic obstructive pulmonary disease) ([place])    History reviewed. No pertinent past surgical history. Family History  Problem Relation Age of Onset  . Asthma Sister   . Asthma Other     nephew  . Hypertension Sister   . Diabetes type II Sister    Social History  Substance Use Topics  . Smoking status: Former Smoker -- 0.50 packs/day for .5 years    Types: Cigarettes    Start date: 07/28/1983    Quit date: 02/27/2013  . Smokeless tobacco: None  . Alcohol Use: No     Comment: quit ETOH about 5 months ago-beer and liquor    Review of Systems  Constitutional: Negative for fever.  Respiratory: Positive for shortness of breath and wheezing.   All other systems reviewed and are negative.     Allergies  Review of patient's allergies indicates no known allergies.  Home Medications   Prior to Admission medications   Medication Sig Start Date End Date Taking? Authorizing Provider  albuterol (PROVENTIL HFA;VENTOLIN HFA) 108 (90 BASE) MCG/ACT inhaler Inhale 2 puffs into the lungs every 6 (six) hours as needed for wheezing or shortness of breath. 07/18/13  Yes Jennifer Piepenbrink, PA-C  amLODipine (NORVASC) 10 MG tablet Take 10 mg by mouth daily.   Yes Historical Provider, MD  beclomethasone (QVAR) 40 MCG/ACT inhaler Inhale 2 puffs into the lungs daily. 07/18/13  Yes Jennifer Piepenbrink, PA-C  Cholecalciferol (VITAMIN D3) 1000 UNITS CAPS  Take 1 capsule by mouth daily.   Yes Historical Provider, MD  cyclobenzaprine (FLEXERIL) 10 MG tablet Take 10 mg by mouth 3 (three) times daily as needed for muscle spasms.   Yes Historical Provider, MD  gemfibrozil (LOPID) 600 MG tablet Take 600 mg by mouth 2 (two) times daily before a meal. Thirty minutes before morning and evening meal.   Yes Historical Provider, MD  lisinopril-hydrochlorothiazide (PRINZIDE,ZESTORETIC) 20-25 MG tablet Take 0.5 tablets by mouth daily.   Yes Historical Provider, MD  metFORMIN (GLUCOPHAGE) 500 MG tablet Take 1 tablet (500 mg total) by mouth daily with breakfast. Patient taking differently: Take 500 mg by mouth 2 (two) times daily with a meal.  07/18/13  Yes Jennifer Piepenbrink, PA-C  naproxen (NAPROSYN) 500 MG tablet Take 1 tablet (500 mg total) by mouth 2 (two) times daily with a meal. 07/18/13  Yes Jennifer Piepenbrink, PA-C  omeprazole (PRILOSEC) 20 MG capsule Take 20 mg by mouth daily.   Yes Historical Provider, MD  sennosides-docusate sodium (SENOKOT-S) 8.6-50 MG tablet Take 2 tablets by mouth daily.   Yes Historical Provider, MD  triamcinolone ointment (KENALOG) 0.1 % Apply 1 application topically 3 (three) times daily.   Yes Historical Provider, MD  fluticasone (FLONASE) 50 MCG/ACT nasal spray Place 2 sprays into both nostrils daily.    Historical Provider, MD  furosemide (LASIX) 20 MG tablet Take  20 mg by mouth daily.  09/22/13   Deneise Lever, MD  Saline (SIMPLY SALINE) 0.9 % AERS Place 2 sprays into the nose daily as needed (for allergies).    Historical Provider, MD   BP 145/78 mmHg  Pulse 109  Temp(Src) 97.8 F (36.6 C) (Oral)  Resp 29  SpO2 88% Physical Exam  Constitutional: He is oriented to person, place, and time. He appears well-developed and well-nourished. He appears distressed.  HENT:  Head: Normocephalic and atraumatic.  Eyes: Pupils are equal, round, and reactive to light.  Neck: Normal range of motion.  Cardiovascular: Normal rate and  intact distal pulses.   Pulmonary/Chest: Tachypnea noted. He is in respiratory distress. He has wheezes.  Abdominal: Normal appearance. He exhibits no distension.  Musculoskeletal: Normal range of motion.  Neurological: He is alert and oriented to person, place, and time. No cranial nerve deficit.  Skin: Skin is warm and dry. No rash noted.  Psychiatric: He has a normal mood and affect. His behavior is normal.  Nursing note and vitals reviewed.   ED Course  Procedures (including critical care time) Medications  albuterol (PROVENTIL,VENTOLIN) solution continuous neb (15 mg Nebulization Given 12/27/14 1646)    Labs Review Labs Reviewed  CBC WITH DIFFERENTIAL/PLATELET - Abnormal; Notable for the following:    Hemoglobin 12.8 (*)    RDW 15.9 (*)    All other components within normal limits  BASIC METABOLIC PANEL - Abnormal; Notable for the following:    CO2 35 (*)    Glucose, Bld 169 (*)    All other components within normal limits  BRAIN NATRIURETIC PEPTIDE  I-STAT TROPOININ, ED    Imaging Review Dg Chest 2 View  12/27/2014  CLINICAL DATA:  Severe shortness of breath with chest pain. Bad cough. History of diabetes and hypertension. EXAM: CHEST  2 VIEW COMPARISON:  Radiographs and CT 05/15/2013. FINDINGS: Cardiomegaly, vascular congestion and bibasilar atelectasis are similar to the prior examination. There is no confluent airspace opacity or significant pleural effusion. Some breathing artifact is present on the lateral view. There are diffuse osteophytes within the thoracic spine consistent with diffuse idiopathic skeletal hyperostosis. Occult spinal dysraphism noted in the lower cervical and upper thoracic spine. IMPRESSION: Stable cardiomegaly, vascular congestion and chronic atelectasis. No acute findings. Electronically Signed   By: Richardean Sale M.D.   On: 12/27/2014 16:34   I have personally reviewed and evaluated these images and lab results as part of my medical  decision-making.  After treatment patient was taken off oxygen and his pulse oximetry dropped to 88%.  We'll plan on admission for further workup and stabilization.  MDM   Final diagnoses:  Asthma, unspecified asthma severity, with acute exacerbation  Morbid obesity, unspecified obesity type (HCC)        Leonard Schwartz, MD 12/27/14 1924

## 2014-12-28 ENCOUNTER — Inpatient Hospital Stay (HOSPITAL_COMMUNITY): Payer: Medicaid Other

## 2014-12-28 ENCOUNTER — Encounter (HOSPITAL_COMMUNITY): Payer: Self-pay | Admitting: Radiology

## 2014-12-28 DIAGNOSIS — J9601 Acute respiratory failure with hypoxia: Secondary | ICD-10-CM

## 2014-12-28 DIAGNOSIS — L8990 Pressure ulcer of unspecified site, unspecified stage: Secondary | ICD-10-CM | POA: Insufficient documentation

## 2014-12-28 DIAGNOSIS — G4733 Obstructive sleep apnea (adult) (pediatric): Secondary | ICD-10-CM

## 2014-12-28 DIAGNOSIS — R0600 Dyspnea, unspecified: Secondary | ICD-10-CM

## 2014-12-28 LAB — COMPREHENSIVE METABOLIC PANEL
ALBUMIN: 3.4 g/dL — AB (ref 3.5–5.0)
ALK PHOS: 75 U/L (ref 38–126)
ALT: 25 U/L (ref 17–63)
ANION GAP: 8 (ref 5–15)
AST: 22 U/L (ref 15–41)
BUN: 19 mg/dL (ref 6–20)
CALCIUM: 9.1 mg/dL (ref 8.9–10.3)
CO2: 32 mmol/L (ref 22–32)
Chloride: 99 mmol/L — ABNORMAL LOW (ref 101–111)
Creatinine, Ser: 1.24 mg/dL (ref 0.61–1.24)
GFR calc Af Amer: >60 mL/min (ref >60)
GFR calc non Af Amer: >60 mL/min (ref >60)
GLUCOSE: 244 mg/dL — AB (ref 65–99)
Potassium: 4.4 mmol/L (ref 3.5–5.1)
SODIUM: 139 mmol/L (ref 135–145)
Total Bilirubin: 0.4 mg/dL (ref 0.3–1.2)
Total Protein: 7.7 g/dL (ref 6.5–8.1)

## 2014-12-28 LAB — GLUCOSE, CAPILLARY
Glucose-Capillary: 139 mg/dL — ABNORMAL HIGH (ref 65–99)
Glucose-Capillary: 113 mg/dL — ABNORMAL HIGH (ref 65–99)
Glucose-Capillary: 125 mg/dL — ABNORMAL HIGH (ref 65–99)
Glucose-Capillary: 134 mg/dL — ABNORMAL HIGH (ref 65–99)

## 2014-12-28 LAB — TROPONIN I: Troponin I: <0.03 ng/mL (ref <0.031)

## 2014-12-28 LAB — CBC WITH DIFFERENTIAL/PLATELET
BASOS PCT: 0 %
Basophils Absolute: 0 10*3/uL (ref 0.0–0.1)
Eosinophils Absolute: 0 10*3/uL (ref 0.0–0.7)
Eosinophils Relative: 0 %
HEMATOCRIT: 40.4 % (ref 39.0–52.0)
Hemoglobin: 13 g/dL (ref 13.0–17.0)
Lymphocytes Relative: 15 %
Lymphs Abs: 1.5 10*3/uL (ref 0.7–4.0)
MCH: 30.2 pg (ref 26.0–34.0)
MCHC: 32.2 g/dL (ref 30.0–36.0)
MCV: 93.7 fL (ref 78.0–100.0)
MONO ABS: 0.5 10*3/uL (ref 0.1–1.0)
MONOS PCT: 5 %
NEUTROS ABS: 8.2 10*3/uL — AB (ref 1.7–7.7)
Neutrophils Relative %: 80 %
PLATELETS: 284 10*3/uL (ref 150–400)
RBC: 4.31 MIL/uL (ref 4.22–5.81)
RDW: 16.1 % — AB (ref 11.5–15.5)
WBC: 10.3 10*3/uL (ref 4.0–10.5)

## 2014-12-28 LAB — TSH: TSH: 0.255 u[IU]/mL — AB (ref 0.350–4.500)

## 2014-12-28 MED ORDER — DM-GUAIFENESIN ER 30-600 MG PO TB12
1.0000 | ORAL_TABLET | Freq: Two times a day (BID) | ORAL | Status: DC
  Administered 2014-12-28 – 2014-12-30 (×5): 1 via ORAL
  Filled 2014-12-28 (×5): qty 1

## 2014-12-28 MED ORDER — OXYMETAZOLINE HCL 0.05 % NA SOLN
1.0000 | Freq: Two times a day (BID) | NASAL | Status: DC | PRN
  Filled 2014-12-28: qty 15

## 2014-12-28 MED ORDER — INSULIN ASPART 100 UNIT/ML ~~LOC~~ SOLN
0.0000 [IU] | Freq: Three times a day (TID) | SUBCUTANEOUS | Status: DC
Start: 2014-12-28 — End: 2014-12-30
  Administered 2014-12-28 – 2014-12-30 (×6): 2 [IU] via SUBCUTANEOUS

## 2014-12-28 MED ORDER — PERFLUTREN LIPID MICROSPHERE
1.0000 mL | INTRAVENOUS | Status: AC | PRN
  Administered 2014-12-28: 4 mL via INTRAVENOUS
  Filled 2014-12-28: qty 10

## 2014-12-28 MED ORDER — INSULIN ASPART 100 UNIT/ML ~~LOC~~ SOLN: 0.0000 [IU] | Freq: Every day | SUBCUTANEOUS | Status: DC

## 2014-12-28 MED ORDER — IOHEXOL 350 MG/ML SOLN
100.0000 mL | Freq: Once | INTRAVENOUS | Status: AC | PRN
  Administered 2014-12-28: 100 mL via INTRAVENOUS

## 2014-12-28 MED ORDER — PERFLUTREN LIPID MICROSPHERE
INTRAVENOUS | Status: AC
  Filled 2014-12-28: qty 10

## 2014-12-28 NOTE — Progress Notes (Signed)
Utilization review completed.  

## 2014-12-28 NOTE — Progress Notes (Signed)
Case Management consult for inability to pick up medicine. This CM met with pt and he states he does not have a problem affording his medications he just has trouble going to get them. This CM printed off a list of pharmacies that deliver in the [place] area. Pt appreciative of information.  Pt unsure if he will discharge home on 02 at this time. CM will continue to follow. Marney Doctor RN,BSN,NCM [phone]

## 2014-12-28 NOTE — Progress Notes (Signed)
[facility] Progress Note   Timothy Le  [id number]  DOB: [birth year]  DOA: 12/27/2014 PCP: Kerin Perna, NP  Brief narrative: Timothy Le is a 54 y.o. male with diabetes mellitus type 2, OSA, COPD/asthma, morbid obesity, hypertension, hyperlipidemia present since to the ER for shortness of breath and cough for 3 days. He states that he is been coughing up pink tinged sputum.   Subjective: No shortness of breath at rest. Cough is improving.  Assessment/Plan: Principal Problem:   Acute respiratory failure with hypoxia - COPD exacerbation/acute bronchitis - CT of the chest is negative for Pneumonia or pulm edema - Continue doxycycline, Pulmicort & DuoNeb nebs  -On 3 L of oxygen - pulse ox 95% at this time- stopped smoking 2 years ago - start Mucinex and Flutter valve  Active Problems:   Obstructive sleep apnea -Continue C Pap at bedtime    Morbid obesity  Body mass index is 61.06 kg/(m^2).   Enlargement of left thyroid lobe - stable when compared with prior imaging- outpt management    Diabetes mellitus type 2, controlled  -Continue sliding scale insulin  HTN - cont Lisinopril and Norvasc    Pressure ulcer - management per nursing staff   Code Status:     Code Status Orders        Start     Ordered   12/27/14 2209  Full code   Continuous     12/27/14 2210     Family Communication:  Disposition Plan: home in 1-2 days DVT prophylaxis: Lovenox  Consultants: Procedures:   Antibiotics: Anti-infectives    Start     Dose/Rate Route Frequency Ordered Stop   12/27/14 2230  doxycycline (VIBRAMYCIN) 100 mg in dextrose 5 % 250 mL IVPB     100 mg 125 mL/hr over 120 Minutes Intravenous 2 times daily 12/27/14 2210        Objective: Filed Weights   12/27/14 2055 12/28/14 0500  Weight: 182.119 kg (401 lb 8 oz) 182.11 kg (401 lb 7.7 oz)    Intake/Output Summary (Last 24 hours) at 12/28/14 1421 Last data filed at 12/28/14 0830  Gross  per 24 hour  Intake    490 ml  Output      0 ml  Net    490 ml     Vitals Filed Vitals:   12/28/14 0240 12/28/14 0500 12/28/14 0824 12/28/14 1412  BP:  144/71    Pulse:  103    Temp:  98 F (36.7 C)    TempSrc:  Oral    Resp:  24    Height:      Weight:  182.11 kg (401 lb 7.7 oz)    SpO2: 92% 99% 95% 94%    Exam:  General:  Pt is alert, not in acute distress  HEENT: No icterus, No thrush, oral mucosa moist  Cardiovascular: regular rate and rhythm, S1/S2 No murmur  Respiratory: clear to auscultation bilaterally   Abdomen: Soft, +Bowel sounds, non tender, non distended, no guarding  MSK: No LE edema, cyanosis or clubbing  Data Reviewed: Basic Metabolic Panel:  Recent Labs Lab 12/27/14 1658 12/28/14 0450  NA 142 139  K 4.3 4.4  CL 101 99*  CO2 35* 32  GLUCOSE 169* 244*  BUN 19 19  CREATININE 1.13 1.24  CALCIUM 9.0 9.1   Liver Function Tests:  Recent Labs Lab 12/28/14 0450  AST 22  ALT 25  ALKPHOS 75  BILITOT 0.4  PROT 7.7  ALBUMIN  3.4*   No results for input(s): LIPASE, AMYLASE in the last 168 hours. No results for input(s): AMMONIA in the last 168 hours. CBC:  Recent Labs Lab 12/27/14 1658 12/28/14 0450  WBC 9.2 10.3  NEUTROABS 6.8 8.2*  HGB 12.8* 13.0  HCT 42.3 40.4  MCV 94.4 93.7  PLT 279 284   Cardiac Enzymes:  Recent Labs Lab 12/28/14 0825  TROPONINI <0.03   BNP (last 3 results)  Recent Labs  12/27/14 1659  BNP 27.9    ProBNP (last 3 results) No results for input(s): PROBNP in the last 8760 hours.  CBG:  Recent Labs Lab 12/27/14 2159 12/28/14 0723 12/28/14 1349  GLUCAP 258* 139* 113*    No results found for this or any previous visit (from the past 240 hour(s)).   Studies: Dg Chest 2 View  12/27/2014  CLINICAL DATA:  Severe shortness of breath with chest pain. Bad cough. History of diabetes and hypertension. EXAM: CHEST  2 VIEW COMPARISON:  Radiographs and CT 05/15/2013. FINDINGS: Cardiomegaly, vascular  congestion and bibasilar atelectasis are similar to the prior examination. There is no confluent airspace opacity or significant pleural effusion. Some breathing artifact is present on the lateral view. There are diffuse osteophytes within the thoracic spine consistent with diffuse idiopathic skeletal hyperostosis. Occult spinal dysraphism noted in the lower cervical and upper thoracic spine. IMPRESSION: Stable cardiomegaly, vascular congestion and chronic atelectasis. No acute findings. Electronically Signed   By: Richardean Sale M.D.   On: 12/27/2014 16:34   Ct Angio Chest Pe W/cm &/or Wo Cm  12/28/2014  CLINICAL DATA:  Progressive shortness of breath EXAM: CT ANGIOGRAPHY CHEST WITH CONTRAST TECHNIQUE: Multidetector CT imaging of the chest was performed using the standard protocol during bolus administration of intravenous contrast. Multiplanar CT image reconstructions and MIPs were obtained to evaluate the vascular anatomy. CONTRAST:  117mL OMNIPAQUE IOHEXOL 350 MG/ML SOLN COMPARISON:  Chest CT angiogram May 15, 2013; chest radiograph December 28, 2014 FINDINGS: There is no demonstrable pulmonary embolus. There is no thoracic aortic aneurysm or dissection. The visualized great vessels appear normal. A small portion of the lung bases are not visualized on this study. There is mild patchy bibasilar atelectasis in the visualized regions. There is no edema or consolidation and visualized lung regions. Enlargement of the left lobe of the thyroid is again noted in a diffuse manner, stable. There is mild impression on the left side of the trachea causing slight rightward deviation of the trachea, a stable finding. Scattered subcentimeter mediastinal lymph nodes are again noted, stable. By size criteria there is no adenopathy in the abdomen or pelvis. The pericardium is not thickened. Visualized upper abdominal structures appear unremarkable. There is degenerative change in the thoracic spine. No blastic or lytic  bone lesions. Review of the MIP images confirms the above findings. IMPRESSION: No demonstrable pulmonary embolus. Lower lobe atelectatic change bilaterally. Subcentimeter mediastinal lymph nodes remain stable without adenopathy by size criteria. Enlargement of the left lobe of the thyroid causing impression on the left side of the trachea is a stable finding compared to prior study. Electronically Signed   By: Lowella Grip III M.D.   On: 12/28/2014 12:58   Dg Chest Port 1 View  12/28/2014  CLINICAL DATA:  55 year old male with shortness of breath and hypoxia. EXAM: PORTABLE CHEST 1 VIEW COMPARISON:  Chest x-ray 12/27/2014. FINDINGS: Lung volumes are low. No consolidative airspace disease. No pleural effusions. No pneumothorax. No pulmonary nodule or mass noted. Pulmonary vasculature and the  cardiomediastinal silhouette are within normal limits. IMPRESSION: 1. Low lung volumes without radiographic evidence of acute cardiopulmonary disease. Electronically Signed   By: Vinnie Langton M.D.   On: 12/28/2014 10:27    Scheduled Meds:  Scheduled Meds: . amLODipine  10 mg Oral Daily  . budesonide (PULMICORT) nebulizer solution  0.25 mg Nebulization BID  . dextromethorphan-guaiFENesin  1 tablet Oral BID  . doxycycline (VIBRAMYCIN) IV  100 mg Intravenous BID  . enoxaparin (LOVENOX) injection  80 mg Subcutaneous QHS  . gemfibrozil  600 mg Oral BID AC  . insulin aspart  0-15 Units Subcutaneous TID WC  . insulin aspart  0-5 Units Subcutaneous QHS  . ipratropium-albuterol  3 mL Nebulization Q6H  . lisinopril  20 mg Oral Daily  . pantoprazole  40 mg Oral Daily  . sodium chloride  3 mL Intravenous Q12H   Continuous Infusions:   Time spent on care of this patient: 35 min   [place], MD 12/28/2014, 2:21 PM  LOS: 1 day   [facility] Office  [phone] Pager - Text Page per [email or web address] If 7PM-7AM, please contact night-coverage [email or web address]

## 2014-12-28 NOTE — Progress Notes (Signed)
Upon entering the room the Pt was off of his CPAP and on his nasal cannula.  Pt stated that he was unable to tolerate the mask.  Education provided but Pt refused to go back on CPAP.  RT will continue to monitor as needed.

## 2014-12-28 NOTE — Progress Notes (Signed)
Nutrition Brief Note  Patient identified on the Malnutrition Screening Tool (MST) Report  Wt Readings from Last 15 Encounters:  12/28/14 401 lb 7.7 oz (182.11 kg)  09/06/14 381 lb (172.82 kg)  06/04/14 395 lb (179.171 kg)  03/29/14 397 lb 12.8 oz (180.441 kg)  11/13/13 406 lb 6.4 oz (184.342 kg)  09/21/13 395 lb 3.2 oz (179.262 kg)  08/08/13 385 lb (174.635 kg)  07/27/13 385 lb 9.6 oz (174.907 kg)  05/15/13 378 lb (171.46 kg)    Body mass index is 61.06 kg/(m^2). Patient meets criteria for morbidly obese based on current BMI. Chart review indicates 20 lb weight gain in the past 4 months. No steroids ordered at this time but MD note states possible fluid overload.  Current diet order is Heart Healthy/Carb Modified, patient is consuming approximately 100% of meals at this time. Labs and medications reviewed.   No nutrition interventions warranted at this time. If nutrition issues arise, please consult RD.      Jarome Matin, RD, LDN Inpatient Clinical Dietitian Pager # [phone] After hours/weekend pager # [phone]

## 2014-12-28 NOTE — Progress Notes (Signed)
Pt placed on CPAP qhs.  Machine plugged into red outlet, humidifier filled with sterile water, and 2 Lpm of O2 being bled into circuit.  Machine set on 20 cm H2O of CPAP per Pt setting documented in chart.  Pt resting stable and comfortable.  RT will continue to assess as needed.

## 2014-12-28 NOTE — Progress Notes (Signed)
Echocardiogram 2D Echocardiogram with Definity has been performed.  Timothy Le 12/28/2014, 3:10 PM

## 2014-12-28 NOTE — Evaluation (Signed)
Physical Therapy Evaluation Patient Details Name: Timothy Le MRN: [id number] DOB: [birth year] Today's Date: 12/28/2014   History of Present Illness  54 y.o. male with diabetes mellitus type 2, OSA, COPD/asthma, morbid obesity, hypertension, hyperlipidemia and admitted for acute respiratory failure with hypoxia - COPD exacerbation/acute bronchitis  Clinical Impression  Pt admitted with above diagnosis. Pt currently with functional limitations due to the deficits listed below (see PT Problem List).  Pt will benefit from skilled PT to increase their independence and safety with mobility to allow discharge to the venue listed below.   Pt reports he does not use oxygen at home and had DOE with ambulation today requiring supplemental oxygen.  SATURATION QUALIFICATIONS: (This note is used to comply with regulatory documentation for home oxygen)  Patient Saturations on Room Air at Rest = 92%  Patient Saturations on Room Air while Ambulating = 87%  Patient Saturations on 2 Liters of oxygen while Ambulating = 85-93%  Please briefly explain why patient needs home oxygen: to improve saturations above 88% during physical activity.     Follow Up Recommendations Home health PT    Equipment Recommendations  None recommended by PT    Recommendations for Other Services       Precautions / Restrictions Precautions Precautions: Fall Precaution Comments: monitor sats      Mobility  Bed Mobility Overal bed mobility: Modified Independent                Transfers Overall transfer level: Needs assistance Equipment used: None Transfers: Sit to/from Stand Sit to Stand: Min guard         General transfer comment: rocking technique utilized by pt  Ambulation/Gait Ambulation/Gait assistance: Min guard Ambulation Distance (Feet): 120 Feet Assistive device: None Gait Pattern/deviations: Step-through pattern;Wide base of support     General Gait Details: slow but steady pace, 3/4  DOE, SpO2 dropped to 87% so applied 2L O2 [place] and SpO2 85% upon return to room however improved in 3 min to 93% with breathing  Stairs            Wheelchair Mobility    Modified Rankin (Stroke Patients Only)       Balance                                             Pertinent Vitals/Pain Pain Assessment: No/denies pain    Home Living Family/patient expects to be discharged to:: Private residence Living Arrangements: Alone   Type of Home: Apartment Home Access: Stairs to enter Entrance Stairs-Rails: Right Entrance Stairs-Number of Steps: 14 Home Layout: One level Home Equipment: None      Prior Function Level of Independence: Independent with assistive device(s)         Comments: states he has to take frequent rest breaks if on his feet     Hand Dominance        Extremity/Trunk Assessment   Upper Extremity Assessment: Overall WFL for tasks assessed           Lower Extremity Assessment: Overall WFL for tasks assessed      Cervical / Trunk Assessment: Other exceptions  Communication   Communication: No difficulties  Cognition Arousal/Alertness: Awake/alert Behavior During Therapy: WFL for tasks assessed/performed Overall Cognitive Status: Within Functional Limits for tasks assessed  General Comments      Exercises        Assessment/Plan    PT Assessment Patient needs continued PT services  PT Diagnosis Difficulty walking   PT Problem List Decreased activity tolerance;Decreased mobility;Cardiopulmonary status limiting activity;Obesity  PT Treatment Interventions DME instruction;Gait training;Functional mobility training;Patient/family education;Stair training;Therapeutic activities;Therapeutic exercise   PT Goals (Current goals can be found in the Care Plan section) Acute Rehab PT Goals PT Goal Formulation: With patient Time For Goal Achievement: 01/11/15 Potential to Achieve Goals:  Good    Frequency Min 3X/week   Barriers to discharge        Co-evaluation               End of Session Equipment Utilized During Treatment: Oxygen Activity Tolerance: Patient limited by fatigue Patient left: in chair;with call bell/phone within reach Nurse Communication: Mobility status (aware pt's bed linen wet and pt up in recliner)         Time: [id number] PT Time Calculation (min) (ACUTE ONLY): 18 min   Charges:   PT Evaluation $Initial PT Evaluation Tier I: 1 Procedure     PT G Codes:        Robertta Halfhill,KATHrine E 12/28/2014, 3:45 PM Carmelia Bake, PT, DPT 12/28/2014 Pager: [phone]

## 2014-12-28 NOTE — Progress Notes (Signed)
RT attempted to place patient on CPAP on 2 different occasions with the patient refusing to wear. RT educated patient on the importance of wearing it at night but he still refused. RT informed patient if he changes his mind have RN contact RT.

## 2014-12-29 LAB — BASIC METABOLIC PANEL
Anion gap: 7 (ref 5–15)
BUN: 23 mg/dL — AB (ref 6–20)
CALCIUM: 8.6 mg/dL — AB (ref 8.9–10.3)
CO2: 32 mmol/L (ref 22–32)
CREATININE: 1.04 mg/dL (ref 0.61–1.24)
Chloride: 97 mmol/L — ABNORMAL LOW (ref 101–111)
GFR calc Af Amer: >60 mL/min (ref >60)
GLUCOSE: 175 mg/dL — AB (ref 65–99)
Potassium: 4.6 mmol/L (ref 3.5–5.1)
Sodium: 136 mmol/L (ref 135–145)

## 2014-12-29 LAB — T4, FREE: FREE T4: 0.69 ng/dL (ref 0.61–1.12)

## 2014-12-29 LAB — TSH: TSH: 1.06 u[IU]/mL (ref 0.350–4.500)

## 2014-12-29 LAB — GLUCOSE, CAPILLARY
GLUCOSE-CAPILLARY: 130 mg/dL — AB (ref 65–99)
GLUCOSE-CAPILLARY: 139 mg/dL — AB (ref 65–99)
Glucose-Capillary: 144 mg/dL — ABNORMAL HIGH (ref 65–99)
Glucose-Capillary: 126 mg/dL — ABNORMAL HIGH (ref 65–99)

## 2014-12-29 NOTE — Progress Notes (Signed)
SATURATION QUALIFICATIONS: (This note is used to comply with regulatory documentation for home oxygen)  Patient Saturations on Room Air at Rest = 93%  Patient Saturations on Room Air while Ambulating = 87%  Patient Saturations on 3 Liters of oxygen while Ambulating = 96%  Please briefly explain why patient needs home oxygen:de sat during ambulation

## 2014-12-29 NOTE — Progress Notes (Signed)
[facility] Progress Note   Timothy Le  [id number]  DOB: [birth year]  DOA: 12/27/2014 PCP: Kerin Perna, NP  Brief narrative: Timothy Le is a 54 y.o. male with diabetes mellitus type 2, OSA, COPD/asthma, morbid obesity, hypertension, hyperlipidemia present since to the ER for shortness of breath and cough for 3 days. He states that he is been coughing up pink tinged sputum.   Subjective: Very short of breath when ambulating today. Cough slightly better.   Assessment/Plan: Principal Problem:   Acute respiratory failure with hypoxia - COPD exacerbation/acute bronchitis  - CT of the chest is negative for Pneumonia or pulm edema - Continue doxycycline, Pulmicort & DuoNeb nebs  -needs 2- 3 L of oxygen on exertion to keep pulse ox > 90 - - stopped smoking 2 years ago - cont Mucinex and Flutter valve  Active Problems:   Obstructive sleep apnea -Continue C Pap at bedtime- states he occasionally does not use it at home- have counseled extensively him on the need for it    Morbid obesity  Body mass index is 59.51 kg/(m^2).  -states he is gaining weight- have counseled him on life style changed that he can make to help lose weight  Enlargement of left thyroid lobe - stable when compared with prior imaging- outpt management- TSH quite low- check Free T4     Diabetes mellitus type 2, controlled  -Continue sliding scale insulin- uses Metformin at home  HTN - cont Lisinopril and Norvasc    Pressure ulcer - management per nursing staff   Code Status:     Code Status Orders        Start     Ordered   12/27/14 2209  Full code   Continuous     12/27/14 2210     Family Communication:  Disposition Plan: home in 1-2 days DVT prophylaxis: Lovenox  Consultants: Procedures:   Antibiotics: Anti-infectives    Start     Dose/Rate Route Frequency Ordered Stop   12/27/14 2230  doxycycline (VIBRAMYCIN) 100 mg in dextrose 5 % 250 mL IVPB     100 mg 125  mL/hr over 120 Minutes Intravenous 2 times daily 12/27/14 2210        Objective: Filed Weights   12/27/14 2055 12/28/14 0500 12/29/14 0615  Weight: 182.119 kg (401 lb 8 oz) 182.11 kg (401 lb 7.7 oz) 177.5 kg (391 lb 5.1 oz)    Intake/Output Summary (Last 24 hours) at 12/29/14 0951 Last data filed at 12/29/14 0754  Gross per 24 hour  Intake    120 ml  Output    700 ml  Net   -580 ml     Vitals Filed Vitals:   12/28/14 1943 12/28/14 2116 12/29/14 0149 12/29/14 0615  BP:  137/80  130/88  Pulse:  78  95  Temp:  98.4 F (36.9 C)  98.3 F (36.8 C)  TempSrc:  Oral  Oral  Resp:  22  22  Height:      Weight:    177.5 kg (391 lb 5.1 oz)  SpO2: 95% 96% 95% 95%    Exam:  General:  Pt is alert, not in acute distress  HEENT: No icterus, No thrush, oral mucosa moist  Cardiovascular: regular rate and rhythm, S1/S2 No murmur  Respiratory: clear to auscultation bilaterally   Abdomen: Soft, +Bowel sounds, non tender, non distended, no guarding  MSK: No LE edema, cyanosis or clubbing  Data Reviewed: Basic Metabolic Panel:  Recent Labs Lab  12/27/14 1658 12/28/14 0450  NA 142 139  K 4.3 4.4  CL 101 99*  CO2 35* 32  GLUCOSE 169* 244*  BUN 19 19  CREATININE 1.13 1.24  CALCIUM 9.0 9.1   Liver Function Tests:  Recent Labs Lab 12/28/14 0450  AST 22  ALT 25  ALKPHOS 75  BILITOT 0.4  PROT 7.7  ALBUMIN 3.4*   No results for input(s): LIPASE, AMYLASE in the last 168 hours. No results for input(s): AMMONIA in the last 168 hours. CBC:  Recent Labs Lab 12/27/14 1658 12/28/14 0450  WBC 9.2 10.3  NEUTROABS 6.8 8.2*  HGB 12.8* 13.0  HCT 42.3 40.4  MCV 94.4 93.7  PLT 279 284   Cardiac Enzymes:  Recent Labs Lab 12/28/14 0825 12/28/14 1515 12/28/14 1846  TROPONINI <0.03 <0.03 <0.03   BNP (last 3 results)  Recent Labs  12/27/14 1659  BNP 27.9    ProBNP (last 3 results) No results for input(s): PROBNP in the last 8760 hours.  CBG:  Recent  Labs Lab 12/28/14 0723 12/28/14 1349 12/28/14 1743 12/28/14 2112 12/29/14 0724  GLUCAP 139* 113* 125* 134* 130*    No results found for this or any previous visit (from the past 240 hour(s)).   Studies: Dg Chest 2 View  12/27/2014  CLINICAL DATA:  Severe shortness of breath with chest pain. Bad cough. History of diabetes and hypertension. EXAM: CHEST  2 VIEW COMPARISON:  Radiographs and CT 05/15/2013. FINDINGS: Cardiomegaly, vascular congestion and bibasilar atelectasis are similar to the prior examination. There is no confluent airspace opacity or significant pleural effusion. Some breathing artifact is present on the lateral view. There are diffuse osteophytes within the thoracic spine consistent with diffuse idiopathic skeletal hyperostosis. Occult spinal dysraphism noted in the lower cervical and upper thoracic spine. IMPRESSION: Stable cardiomegaly, vascular congestion and chronic atelectasis. No acute findings. Electronically Signed   By: Richardean Sale M.D.   On: 12/27/2014 16:34   Ct Angio Chest Pe W/cm &/or Wo Cm  12/28/2014  CLINICAL DATA:  Progressive shortness of breath EXAM: CT ANGIOGRAPHY CHEST WITH CONTRAST TECHNIQUE: Multidetector CT imaging of the chest was performed using the standard protocol during bolus administration of intravenous contrast. Multiplanar CT image reconstructions and MIPs were obtained to evaluate the vascular anatomy. CONTRAST:  154mL OMNIPAQUE IOHEXOL 350 MG/ML SOLN COMPARISON:  Chest CT angiogram May 15, 2013; chest radiograph December 28, 2014 FINDINGS: There is no demonstrable pulmonary embolus. There is no thoracic aortic aneurysm or dissection. The visualized great vessels appear normal. A small portion of the lung bases are not visualized on this study. There is mild patchy bibasilar atelectasis in the visualized regions. There is no edema or consolidation and visualized lung regions. Enlargement of the left lobe of the thyroid is again noted in a  diffuse manner, stable. There is mild impression on the left side of the trachea causing slight rightward deviation of the trachea, a stable finding. Scattered subcentimeter mediastinal lymph nodes are again noted, stable. By size criteria there is no adenopathy in the abdomen or pelvis. The pericardium is not thickened. Visualized upper abdominal structures appear unremarkable. There is degenerative change in the thoracic spine. No blastic or lytic bone lesions. Review of the MIP images confirms the above findings. IMPRESSION: No demonstrable pulmonary embolus. Lower lobe atelectatic change bilaterally. Subcentimeter mediastinal lymph nodes remain stable without adenopathy by size criteria. Enlargement of the left lobe of the thyroid causing impression on the left side of the trachea is a  stable finding compared to prior study. Electronically Signed   By: Lowella Grip III M.D.   On: 12/28/2014 12:58   Dg Chest Port 1 View  12/28/2014  CLINICAL DATA:  54 year old male with shortness of breath and hypoxia. EXAM: PORTABLE CHEST 1 VIEW COMPARISON:  Chest x-ray 12/27/2014. FINDINGS: Lung volumes are low. No consolidative airspace disease. No pleural effusions. No pneumothorax. No pulmonary nodule or mass noted. Pulmonary vasculature and the cardiomediastinal silhouette are within normal limits. IMPRESSION: 1. Low lung volumes without radiographic evidence of acute cardiopulmonary disease. Electronically Signed   By: Vinnie Langton M.D.   On: 12/28/2014 10:27    Scheduled Meds:  Scheduled Meds: . amLODipine  10 mg Oral Daily  . budesonide (PULMICORT) nebulizer solution  0.25 mg Nebulization BID  . dextromethorphan-guaiFENesin  1 tablet Oral BID  . doxycycline (VIBRAMYCIN) IV  100 mg Intravenous BID  . enoxaparin (LOVENOX) injection  80 mg Subcutaneous QHS  . gemfibrozil  600 mg Oral BID AC  . insulin aspart  0-15 Units Subcutaneous TID WC  . insulin aspart  0-5 Units Subcutaneous QHS  .  ipratropium-albuterol  3 mL Nebulization Q6H  . lisinopril  20 mg Oral Daily  . pantoprazole  40 mg Oral Daily  . sodium chloride  3 mL Intravenous Q12H   Continuous Infusions:   Time spent on care of this patient: 35 min   [place], MD 12/29/2014, 9:51 AM  LOS: 2 days   [facility] Office  [phone] Pager - Text Page per [email or web address] If 7PM-7AM, please contact night-coverage [email or web address]

## 2014-12-29 NOTE — Progress Notes (Signed)
RT note: asked to stop back at midnite to place pt. on cpap, RT to monitor.

## 2014-12-30 DIAGNOSIS — J438 Other emphysema: Secondary | ICD-10-CM

## 2014-12-30 DIAGNOSIS — J219 Acute bronchiolitis, unspecified: Secondary | ICD-10-CM

## 2014-12-30 LAB — GLUCOSE, CAPILLARY
Glucose-Capillary: 132 mg/dL — ABNORMAL HIGH (ref 65–99)
Glucose-Capillary: 126 mg/dL — ABNORMAL HIGH (ref 65–99)

## 2014-12-30 MED ORDER — AMLODIPINE BESYLATE 10 MG PO TABS: 10.0000 mg | ORAL_TABLET | Freq: Every day | ORAL | Status: DC

## 2014-12-30 MED ORDER — DOXYCYCLINE HYCLATE 50 MG PO CAPS: 50.0000 mg | ORAL_CAPSULE | Freq: Two times a day (BID) | ORAL | Status: DC

## 2014-12-30 MED ORDER — ALBUTEROL SULFATE HFA 108 (90 BASE) MCG/ACT IN AERS: 2.0000 | INHALATION_SPRAY | RESPIRATORY_TRACT | Status: DC | PRN

## 2014-12-30 MED ORDER — DM-GUAIFENESIN ER 30-600 MG PO TB12: 1.0000 | ORAL_TABLET | Freq: Two times a day (BID) | ORAL | Status: DC | PRN

## 2014-12-30 MED ORDER — METFORMIN HCL 500 MG PO TABS: 500.0000 mg | ORAL_TABLET | Freq: Two times a day (BID) | ORAL | Status: DC

## 2014-12-30 NOTE — Progress Notes (Signed)
SATURATION QUALIFICATIONS: (This note is used to comply with regulatory documentation for home oxygen)  Patient Saturations on Room Air at Rest = 96%  Patient Saturations on Room Air while Ambulating = 87%  Patient Saturations on 2 Liters of oxygen while Ambulating = 93%  Please briefly explain why patient needs home oxygen: while ambulating

## 2014-12-30 NOTE — Progress Notes (Signed)
RT note: Pt. refused to be placed on cpap, remains on 2 lpm n/c, RT to monitor.

## 2014-12-30 NOTE — Care Management Note (Signed)
Case Management Note  Patient Details  Name: Timothy Le MRN: [id number] Date of Birth: May 13, 1960  Subjective/Objective:      COPD exacerbation/acute bronchitis               Action/Plan: Contacted [facility] for oxygen for home. Pt has Medicaid and coverage for [facility] PT is limited to certain diagnosis. Pt understands that [facility] PT may not be covered by his Medicaid and he cannot afford to cover out of pocket.   Expected Discharge Date:  12/30/2014               Expected Discharge Plan:  Home/Self Care  In-House Referral:  NA  Discharge planning Services  CM Consult  Post Acute Care Choice:  NA Choice offered to:  NA  DME Arranged:  Oxygen DME Agency:  [place]:  NA [place] Agency:  NA  Status of Service:  Completed, signed off  Medicare Important Message Given:    Date Medicare IM Given:    Medicare IM give by:    Date Additional Medicare IM Given:    Additional Medicare Important Message give by:     If discussed at [place] of Stay Meetings, dates discussed:    Additional Comments:  Erenest Rasher, RN 12/30/2014, 11:44 AM

## 2014-12-30 NOTE — Discharge Summary (Signed)
Physician Discharge Summary  Timothy Le [id number] DOB: 1960/02/28 DOA: 12/27/2014  PCP: Kerin Perna, NP  Admit date: 12/27/2014 Discharge date: 12/30/2014  Time spent:55 minutes  Recommendations for Outpatient Follow-up:  1. Need to f/u with Pulmonary for repeat sleep study- becoming hypoxic even with CPAP 2. Needs continue encouragement to lose weight and use CPAP at night  Discharge Condition: stable    Discharge Diagnoses:  Principal Problem:   Acute respiratory failure with hypoxia ([place]) Active Problems:   COPD (chronic obstructive pulmonary disease) with emphysema (HCC)   Obstructive sleep apnea   Morbid obesity ([place])   Diabetes mellitus type 2, controlled ([place])   Pressure ulcer   History of present illness:  Timothy Le is a 54 y.o. male with diabetes mellitus type 2, OSA, COPD/asthma, morbid obesity, hypertension, hyperlipidemia present since to the ER for shortness of breath and cough for 3 days. He states that he is been coughing up pink tinged sputum.   Hospital Course:  Principal Problem:  Acute respiratory failure with hypoxia - COPD exacerbation/acute bronchitis - OHS/ OSA - last visit with Dr Annamaria Boots 09/06/14- noted to have severe OSA/ OHS, moderate restrictive disease and COPD- recommended a repeat sleep study as despite using his CPAP, he was desaturating at night - has been mostly refusing his CPAP in the hospital and despite a lengthy conversation about the benefits of it yesterday, he refused it again last night.  - also states that he has been gaining weight - ER work up including a CT of the chest which is negative for Pneumonia, PE and pulm edema - treated as acute bronchitis (not COPD flare as no wheezing)- started on doxycycline to complete a 5 day course- cough and congestion improving - cont inhalers and Nebs that he is already using at home  - pulse ox on ambulation reveals that he needs  1-2 L of oxygen on exertion to keep pulse  ox > 90- will be prescribed Oxygen today for home use - - stopped smoking 5 years ago - cont Mucinex and Flutter valve - f/u with Pulmonary   Active Problems:   Morbid obesity  Body mass index is 59.51 kg/(m^2).  -states he is gaining weight- have counseled him on life style changed that he can make to help lose weight  Enlargement of left thyroid lobe - stable when compared with prior imaging- outpt management- TSH initially quite low- repeat TSH and Free T4 normal   Diabetes mellitus type 2, controlled  -  uses Metformin at home BID- can continue  HTN - cont Lisinopril/HCTZ and Norvasc-    Pressure ulcer - management per nursing staff   Procedures:  none  Consultations:  none  Discharge Exam: Filed Weights   12/28/14 0500 12/29/14 0615 12/30/14 0550  Weight: 182.11 kg (401 lb 7.7 oz) 177.5 kg (391 lb 5.1 oz) 175.9 kg (387 lb 12.6 oz)   Filed Vitals:   12/29/14 2126 12/30/14 0550  BP: 126/63 148/75  Pulse: 70 90  Temp: 98.4 F (36.9 C) 97.9 F (36.6 C)  Resp: 20 20    General: AAO x 3, no distress, morbidly obese Cardiovascular: RRR, no murmurs  Respiratory: clear to auscultation bilaterally GI: soft, non-tender, non-distended, bowel sound positive  Discharge Instructions You were cared for by a hospitalist during your hospital stay. If you have any questions about your discharge medications or the care you received while you were in the hospital after you are discharged, you can call the  unit and asked to speak with the hospitalist on call if the hospitalist that took care of you is not available. Once you are discharged, your primary care physician will handle any further medical issues. Please note that NO REFILLS for any discharge medications will be authorized once you are discharged, as it is imperative that you return to your primary care physician (or establish a relationship with a primary care physician if you do not have one) for your aftercare  needs so that they can reassess your need for medications and monitor your lab values.      Discharge Instructions    Discharge instructions    Complete by:  As directed   Low sodium, heart healthy, low fat, diabetic diet You need continue to try to lose weight You need you use your CPAP machine every time you are sleeping. You will need to see your family doctor in 1 wk.     Increase activity slowly    Complete by:  As directed             Medication List    STOP taking these medications        naproxen 500 MG tablet  Commonly known as:  NAPROSYN      TAKE these medications        albuterol 108 (90 BASE) MCG/ACT inhaler  Commonly known as:  PROVENTIL HFA;VENTOLIN HFA  Inhale 2 puffs into the lungs every 4 (four) hours as needed for wheezing or shortness of breath.     amLODipine 10 MG tablet  Commonly known as:  NORVASC  Take 1 tablet (10 mg total) by mouth daily.     beclomethasone 40 MCG/ACT inhaler  Commonly known as:  QVAR  Inhale 2 puffs into the lungs daily.     cyclobenzaprine 10 MG tablet  Commonly known as:  FLEXERIL  Take 10 mg by mouth 3 (three) times daily as needed for muscle spasms.     dextromethorphan-guaiFENesin 30-600 MG 12hr tablet  Commonly known as:  MUCINEX DM  Take 1 tablet by mouth 2 (two) times daily as needed for cough (and chest congestion).     doxycycline 50 MG capsule  Commonly known as:  VIBRAMYCIN  Take 1 capsule (50 mg total) by mouth 2 (two) times daily.     fluticasone 50 MCG/ACT nasal spray  Commonly known as:  FLONASE  Place 2 sprays into both nostrils daily as needed for allergies.     gemfibrozil 600 MG tablet  Commonly known as:  LOPID  Take 600 mg by mouth 2 (two) times daily before a meal. Thirty minutes before morning and evening meal.     lisinopril-hydrochlorothiazide 20-25 MG tablet  Commonly known as:  PRINZIDE,ZESTORETIC  Take 1 tablet by mouth daily.     metFORMIN 500 MG tablet  Commonly known as:   GLUCOPHAGE  Take 1 tablet (500 mg total) by mouth 2 (two) times daily with a meal.     omeprazole 20 MG capsule  Commonly known as:  PRILOSEC  Take 20 mg by mouth daily as needed (for acid reflex).     SIMPLY SALINE 0.9 % Aers  Generic drug:  Saline  Place 2 sprays into the nose daily as needed (for allergies).     triamcinolone ointment 0.1 %  Commonly known as:  KENALOG  Apply 1 application topically 3 (three) times daily.     Vitamin D3 1000 UNITS Caps  Take 1 capsule by mouth daily.  No Known Allergies    The results of significant diagnostics from this hospitalization (including imaging, microbiology, ancillary and laboratory) are listed below for reference.    Significant Diagnostic Studies: Dg Chest 2 View  12/27/2014  CLINICAL DATA:  Severe shortness of breath with chest pain. Bad cough. History of diabetes and hypertension. EXAM: CHEST  2 VIEW COMPARISON:  Radiographs and CT 05/15/2013. FINDINGS: Cardiomegaly, vascular congestion and bibasilar atelectasis are similar to the prior examination. There is no confluent airspace opacity or significant pleural effusion. Some breathing artifact is present on the lateral view. There are diffuse osteophytes within the thoracic spine consistent with diffuse idiopathic skeletal hyperostosis. Occult spinal dysraphism noted in the lower cervical and upper thoracic spine. IMPRESSION: Stable cardiomegaly, vascular congestion and chronic atelectasis. No acute findings. Electronically Signed   By: Richardean Sale M.D.   On: 12/27/2014 16:34   Ct Angio Chest Pe W/cm &/or Wo Cm  12/28/2014  CLINICAL DATA:  Progressive shortness of breath EXAM: CT ANGIOGRAPHY CHEST WITH CONTRAST TECHNIQUE: Multidetector CT imaging of the chest was performed using the standard protocol during bolus administration of intravenous contrast. Multiplanar CT image reconstructions and MIPs were obtained to evaluate the vascular anatomy. CONTRAST:  155mL OMNIPAQUE  IOHEXOL 350 MG/ML SOLN COMPARISON:  Chest CT angiogram May 15, 2013; chest radiograph December 28, 2014 FINDINGS: There is no demonstrable pulmonary embolus. There is no thoracic aortic aneurysm or dissection. The visualized great vessels appear normal. A small portion of the lung bases are not visualized on this study. There is mild patchy bibasilar atelectasis in the visualized regions. There is no edema or consolidation and visualized lung regions. Enlargement of the left lobe of the thyroid is again noted in a diffuse manner, stable. There is mild impression on the left side of the trachea causing slight rightward deviation of the trachea, a stable finding. Scattered subcentimeter mediastinal lymph nodes are again noted, stable. By size criteria there is no adenopathy in the abdomen or pelvis. The pericardium is not thickened. Visualized upper abdominal structures appear unremarkable. There is degenerative change in the thoracic spine. No blastic or lytic bone lesions. Review of the MIP images confirms the above findings. IMPRESSION: No demonstrable pulmonary embolus. Lower lobe atelectatic change bilaterally. Subcentimeter mediastinal lymph nodes remain stable without adenopathy by size criteria. Enlargement of the left lobe of the thyroid causing impression on the left side of the trachea is a stable finding compared to prior study. Electronically Signed   By: Lowella Grip III M.D.   On: 12/28/2014 12:58   Dg Chest Port 1 View  12/28/2014  CLINICAL DATA:  54 year old male with shortness of breath and hypoxia. EXAM: PORTABLE CHEST 1 VIEW COMPARISON:  Chest x-ray 12/27/2014. FINDINGS: Lung volumes are low. No consolidative airspace disease. No pleural effusions. No pneumothorax. No pulmonary nodule or mass noted. Pulmonary vasculature and the cardiomediastinal silhouette are within normal limits. IMPRESSION: 1. Low lung volumes without radiographic evidence of acute cardiopulmonary disease.  Electronically Signed   By: Vinnie Langton M.D.   On: 12/28/2014 10:27    Microbiology: No results found for this or any previous visit (from the past 240 hour(s)).   Labs: Basic Metabolic Panel:  Recent Labs Lab 12/27/14 1658 12/28/14 0450 12/29/14 1125  NA 142 139 136  K 4.3 4.4 4.6  CL 101 99* 97*  CO2 35* 32 32  GLUCOSE 169* 244* 175*  BUN 19 19 23*  CREATININE 1.13 1.24 1.04  CALCIUM 9.0 9.1 8.6*  Liver Function Tests:  Recent Labs Lab 12/28/14 0450  AST 22  ALT 25  ALKPHOS 75  BILITOT 0.4  PROT 7.7  ALBUMIN 3.4*   No results for input(s): LIPASE, AMYLASE in the last 168 hours. No results for input(s): AMMONIA in the last 168 hours. CBC:  Recent Labs Lab 12/27/14 1658 12/28/14 0450  WBC 9.2 10.3  NEUTROABS 6.8 8.2*  HGB 12.8* 13.0  HCT 42.3 40.4  MCV 94.4 93.7  PLT 279 284   Cardiac Enzymes:  Recent Labs Lab 12/28/14 0825 12/28/14 1515 12/28/14 1846  TROPONINI <0.03 <0.03 <0.03   BNP: BNP (last 3 results)  Recent Labs  12/27/14 1659  BNP 27.9    ProBNP (last 3 results) No results for input(s): PROBNP in the last 8760 hours.  CBG:  Recent Labs Lab 12/29/14 0724 12/29/14 1204 12/29/14 1636 12/29/14 2131 12/30/14 0749  GLUCAP 130* 144* 126* 139* 132*       SignedDebbe Odea, MD [facility] 12/30/2014, 10:47 AM

## 2014-12-31 LAB — HEMOGLOBIN A1C
Hgb A1c MFr Bld: 7.9 % — ABNORMAL HIGH (ref 4.8–5.6)
Mean Plasma Glucose: 180 mg/dL

## 2015-01-24 ENCOUNTER — Telehealth: Payer: Self-pay | Admitting: Internal Medicine

## 2015-01-24 NOTE — Telephone Encounter (Signed)
Recommendations for Outpatient Follow-up:  1. Need to f/u with Pulmonary for repeat sleep study- becoming hypoxic even with CPAP 2. Needs continue encouragement to lose weight and use CPAP at night ----  Dr. Annamaria Boots, please advise where pt can be worked into schedule for HFU? thanks

## 2015-01-25 NOTE — Telephone Encounter (Signed)
Pt can come in on Tuesday 01-29-15 at 11:15am for HFU. Thanks.

## 2015-01-25 NOTE — Telephone Encounter (Signed)
lmomtcb x1 for pt 

## 2015-01-28 NOTE — Telephone Encounter (Signed)
LMTCB x2  

## 2015-01-29 NOTE — Telephone Encounter (Signed)
Unable to contact patient prior to appointment given by [facility]. Timothy Le, can you offer another appointment date?

## 2015-01-30 NOTE — Telephone Encounter (Signed)
LM for Timothy Le x 1- please schedule appt when call returned. Thanks.

## 2015-01-30 NOTE — Telephone Encounter (Signed)
Pt can be seen on Tuesday 02/05/15 at 11:15am for HFU. Thanks.

## 2015-01-30 NOTE — Telephone Encounter (Signed)
Pt cb, asked him if he could come for hospital f/u on 02/05/15 at 11:15am per [facility] and patient agreed, appt has been sched, patient verbalized understanding, nothing further needed

## 2015-02-05 ENCOUNTER — Inpatient Hospital Stay: Payer: Medicaid Other | Admitting: Internal Medicine

## 2015-02-07 ENCOUNTER — Encounter (HOSPITAL_COMMUNITY): Payer: Self-pay | Admitting: Emergency Medicine

## 2015-02-07 ENCOUNTER — Emergency Department (HOSPITAL_COMMUNITY): Payer: Medicaid Other

## 2015-02-07 ENCOUNTER — Inpatient Hospital Stay (HOSPITAL_COMMUNITY)
Admission: EM | Admit: 2015-02-07 | Discharge: 2015-02-10 | DRG: 191 | Disposition: A | Discharge status: Home / Self Care | Payer: Medicaid Other | Attending: Internal Medicine | Admitting: Internal Medicine

## 2015-02-07 DIAGNOSIS — Z7984 Long term (current) use of oral hypoglycemic drugs: Secondary | ICD-10-CM

## 2015-02-07 DIAGNOSIS — R0602 Shortness of breath: Secondary | ICD-10-CM | POA: Diagnosis present

## 2015-02-07 DIAGNOSIS — J449 Chronic obstructive pulmonary disease, unspecified: Secondary | ICD-10-CM

## 2015-02-07 DIAGNOSIS — R05 Cough: Secondary | ICD-10-CM | POA: Diagnosis not present

## 2015-02-07 DIAGNOSIS — Z87891 Personal history of nicotine dependence: Secondary | ICD-10-CM

## 2015-02-07 DIAGNOSIS — Z9114 Patient's other noncompliance with medication regimen: Secondary | ICD-10-CM | POA: Diagnosis not present

## 2015-02-07 DIAGNOSIS — G4733 Obstructive sleep apnea (adult) (pediatric): Secondary | ICD-10-CM | POA: Diagnosis present

## 2015-02-07 DIAGNOSIS — J441 Chronic obstructive pulmonary disease with (acute) exacerbation: Secondary | ICD-10-CM | POA: Diagnosis present

## 2015-02-07 DIAGNOSIS — E8770 Fluid overload, unspecified: Secondary | ICD-10-CM | POA: Diagnosis not present

## 2015-02-07 DIAGNOSIS — E119 Type 2 diabetes mellitus without complications: Secondary | ICD-10-CM | POA: Diagnosis present

## 2015-02-07 DIAGNOSIS — Z9981 Dependence on supplemental oxygen: Secondary | ICD-10-CM | POA: Diagnosis not present

## 2015-02-07 DIAGNOSIS — I110 Hypertensive heart disease with heart failure: Secondary | ICD-10-CM | POA: Diagnosis present

## 2015-02-07 DIAGNOSIS — D649 Anemia, unspecified: Secondary | ICD-10-CM

## 2015-02-07 DIAGNOSIS — I509 Heart failure, unspecified: Secondary | ICD-10-CM | POA: Insufficient documentation

## 2015-02-07 DIAGNOSIS — E873 Alkalosis: Secondary | ICD-10-CM

## 2015-02-07 DIAGNOSIS — Z6841 Body Mass Index (BMI) 40.0 and over, adult: Secondary | ICD-10-CM

## 2015-02-07 DIAGNOSIS — R0600 Dyspnea, unspecified: Secondary | ICD-10-CM

## 2015-02-07 HISTORY — DX: Reserved for inherently not codable concepts without codable children: IMO0001

## 2015-02-07 LAB — CBC
HCT: 38.6 % — ABNORMAL LOW (ref 39.0–52.0)
HEMOGLOBIN: 11.5 g/dL — AB (ref 13.0–17.0)
MCH: 28.7 pg (ref 26.0–34.0)
MCHC: 29.8 g/dL — AB (ref 30.0–36.0)
MCV: 96.3 fL (ref 78.0–100.0)
Platelets: 250 10*3/uL (ref 150–400)
RBC: 4.01 MIL/uL — ABNORMAL LOW (ref 4.22–5.81)
RDW: 17.9 % — ABNORMAL HIGH (ref 11.5–15.5)
WBC: 9.7 10*3/uL (ref 4.0–10.5)

## 2015-02-07 LAB — DIFFERENTIAL
BASOS ABS: 0 10*3/uL (ref 0.0–0.1)
Basophils Relative: 0 %
EOS ABS: 0.2 10*3/uL (ref 0.0–0.7)
Eosinophils Relative: 3 %
LYMPHS ABS: 2.4 10*3/uL (ref 0.7–4.0)
LYMPHS PCT: 25 %
MONOS PCT: 8 %
Monocytes Absolute: 0.7 10*3/uL (ref 0.1–1.0)
Neutro Abs: 6.3 10*3/uL (ref 1.7–7.7)
Neutrophils Relative %: 65 %

## 2015-02-07 LAB — BASIC METABOLIC PANEL
ANION GAP: 10 (ref 5–15)
BUN: 18 mg/dL (ref 6–20)
CALCIUM: 9.4 mg/dL (ref 8.9–10.3)
CO2: 35 mmol/L — ABNORMAL HIGH (ref 22–32)
Chloride: 98 mmol/L — ABNORMAL LOW (ref 101–111)
Creatinine, Ser: 1.19 mg/dL (ref 0.61–1.24)
GLUCOSE: 102 mg/dL — AB (ref 65–99)
Potassium: 5.1 mmol/L (ref 3.5–5.1)
Sodium: 143 mmol/L (ref 135–145)

## 2015-02-07 LAB — BRAIN NATRIURETIC PEPTIDE: B Natriuretic Peptide: 26.9 pg/mL (ref 0.0–100.0)

## 2015-02-07 LAB — TROPONIN I: Troponin I: <0.03 ng/mL (ref <0.031)

## 2015-02-07 LAB — GLUCOSE, CAPILLARY: Glucose-Capillary: 117 mg/dL — ABNORMAL HIGH (ref 65–99)

## 2015-02-07 MED ORDER — ACETAMINOPHEN 650 MG RE SUPP: 650.0000 mg | Freq: Four times a day (QID) | RECTAL | Status: DC | PRN

## 2015-02-07 MED ORDER — FUROSEMIDE 10 MG/ML IJ SOLN
40.0000 mg | Freq: Once | INTRAMUSCULAR | Status: AC
  Administered 2015-02-07: 40 mg via INTRAVENOUS
  Filled 2015-02-07: qty 4

## 2015-02-07 MED ORDER — FUROSEMIDE 10 MG/ML IJ SOLN
40.0000 mg | Freq: Two times a day (BID) | INTRAMUSCULAR | Status: DC
  Administered 2015-02-08 (×2): 40 mg via INTRAVENOUS
  Filled 2015-02-07 (×2): qty 4

## 2015-02-07 MED ORDER — LISINOPRIL 20 MG PO TABS
20.0000 mg | ORAL_TABLET | Freq: Every day | ORAL | Status: DC
  Administered 2015-02-08 – 2015-02-10 (×3): 20 mg via ORAL
  Filled 2015-02-07 (×3): qty 1

## 2015-02-07 MED ORDER — ENOXAPARIN SODIUM 40 MG/0.4ML ~~LOC~~ SOLN
40.0000 mg | SUBCUTANEOUS | Status: DC
  Administered 2015-02-07: 40 mg via SUBCUTANEOUS
  Filled 2015-02-07: qty 0.4

## 2015-02-07 MED ORDER — PREDNISONE 20 MG PO TABS
40.0000 mg | ORAL_TABLET | Freq: Every day | ORAL | Status: DC
  Administered 2015-02-07: 40 mg via ORAL
  Filled 2015-02-07 (×3): qty 2

## 2015-02-07 MED ORDER — BECLOMETHASONE DIPROPIONATE 40 MCG/ACT IN AERS: 2.0000 | INHALATION_SPRAY | Freq: Every day | RESPIRATORY_TRACT | Status: DC

## 2015-02-07 MED ORDER — ALBUTEROL SULFATE HFA 108 (90 BASE) MCG/ACT IN AERS: 2.0000 | INHALATION_SPRAY | RESPIRATORY_TRACT | Status: DC | PRN

## 2015-02-07 MED ORDER — ALBUTEROL SULFATE (2.5 MG/3ML) 0.083% IN NEBU
2.5000 mg | INHALATION_SOLUTION | RESPIRATORY_TRACT | Status: DC | PRN
  Administered 2015-02-09: 2.5 mg via RESPIRATORY_TRACT
  Filled 2015-02-07: qty 3

## 2015-02-07 MED ORDER — GUAIFENESIN-DM 100-10 MG/5ML PO SYRP
5.0000 mL | ORAL_SOLUTION | ORAL | Status: DC
  Administered 2015-02-07 – 2015-02-10 (×15): 5 mL via ORAL
  Filled 2015-02-07 (×14): qty 5

## 2015-02-07 MED ORDER — INSULIN ASPART 100 UNIT/ML ~~LOC~~ SOLN
0.0000 [IU] | Freq: Three times a day (TID) | SUBCUTANEOUS | Status: DC
  Administered 2015-02-08: 2 [IU] via SUBCUTANEOUS
  Administered 2015-02-08: 1 [IU] via SUBCUTANEOUS
  Administered 2015-02-08: 2 [IU] via SUBCUTANEOUS
  Administered 2015-02-09 (×2): 3 [IU] via SUBCUTANEOUS
  Administered 2015-02-09: 2 [IU] via SUBCUTANEOUS
  Administered 2015-02-10: 5 [IU] via SUBCUTANEOUS

## 2015-02-07 MED ORDER — DEXTROSE 5 % IV SOLN
2.0000 g | Freq: Two times a day (BID) | INTRAVENOUS | Status: DC
  Administered 2015-02-07 – 2015-02-09 (×5): 2 g via INTRAVENOUS
  Filled 2015-02-07 (×7): qty 2

## 2015-02-07 MED ORDER — INSULIN ASPART 100 UNIT/ML ~~LOC~~ SOLN
0.0000 [IU] | Freq: Every day | SUBCUTANEOUS | Status: DC
  Administered 2015-02-09: 2 [IU] via SUBCUTANEOUS

## 2015-02-07 MED ORDER — ALBUTEROL SULFATE (2.5 MG/3ML) 0.083% IN NEBU: 5.0000 mg | INHALATION_SOLUTION | Freq: Once | RESPIRATORY_TRACT | Status: DC

## 2015-02-07 MED ORDER — AMLODIPINE BESYLATE 10 MG PO TABS
10.0000 mg | ORAL_TABLET | Freq: Every day | ORAL | Status: DC
  Administered 2015-02-08 – 2015-02-10 (×3): 10 mg via ORAL
  Filled 2015-02-07 (×3): qty 1

## 2015-02-07 MED ORDER — BENZONATATE 100 MG PO CAPS
100.0000 mg | ORAL_CAPSULE | Freq: Three times a day (TID) | ORAL | Status: DC
  Administered 2015-02-07 – 2015-02-10 (×8): 100 mg via ORAL
  Filled 2015-02-07 (×8): qty 1

## 2015-02-07 MED ORDER — IPRATROPIUM-ALBUTEROL 0.5-2.5 (3) MG/3ML IN SOLN
3.0000 mL | Freq: Once | RESPIRATORY_TRACT | Status: AC
  Administered 2015-02-07: 3 mL via RESPIRATORY_TRACT
  Filled 2015-02-07: qty 3

## 2015-02-07 MED ORDER — PANTOPRAZOLE SODIUM 40 MG PO TBEC
40.0000 mg | DELAYED_RELEASE_TABLET | Freq: Every day | ORAL | Status: DC
  Administered 2015-02-07 – 2015-02-10 (×4): 40 mg via ORAL
  Filled 2015-02-07 (×4): qty 1

## 2015-02-07 MED ORDER — GEMFIBROZIL 600 MG PO TABS
600.0000 mg | ORAL_TABLET | Freq: Two times a day (BID) | ORAL | Status: DC
  Administered 2015-02-08 – 2015-02-10 (×5): 600 mg via ORAL
  Filled 2015-02-07 (×7): qty 1

## 2015-02-07 MED ORDER — ACETAMINOPHEN 325 MG PO TABS: 650.0000 mg | ORAL_TABLET | Freq: Four times a day (QID) | ORAL | Status: DC | PRN

## 2015-02-07 MED ORDER — BUDESONIDE 0.25 MG/2ML IN SUSP
0.2500 mg | Freq: Two times a day (BID) | RESPIRATORY_TRACT | Status: DC
  Administered 2015-02-08 – 2015-02-10 (×5): 0.25 mg via RESPIRATORY_TRACT
  Filled 2015-02-07 (×5): qty 2

## 2015-02-07 MED ORDER — SODIUM CHLORIDE 0.9 % IJ SOLN: 3.0000 mL | INTRAMUSCULAR | Status: DC | PRN

## 2015-02-07 MED ORDER — IPRATROPIUM-ALBUTEROL 0.5-2.5 (3) MG/3ML IN SOLN
3.0000 mL | RESPIRATORY_TRACT | Status: DC
  Administered 2015-02-07 – 2015-02-08 (×3): 3 mL via RESPIRATORY_TRACT
  Filled 2015-02-07 (×3): qty 3

## 2015-02-07 MED ORDER — SODIUM CHLORIDE 0.9 % IV SOLN: 250.0000 mL | INTRAVENOUS | Status: DC | PRN

## 2015-02-07 MED ORDER — SODIUM CHLORIDE 0.9 % IJ SOLN
3.0000 mL | Freq: Two times a day (BID) | INTRAMUSCULAR | Status: DC
  Administered 2015-02-07 – 2015-02-10 (×5): 3 mL via INTRAVENOUS

## 2015-02-07 MED ORDER — SODIUM CHLORIDE 0.9 % IJ SOLN
3.0000 mL | Freq: Two times a day (BID) | INTRAMUSCULAR | Status: DC
  Administered 2015-02-07 – 2015-02-09 (×5): 3 mL via INTRAVENOUS

## 2015-02-07 MED ORDER — FLUTICASONE PROPIONATE 50 MCG/ACT NA SUSP
2.0000 | Freq: Every day | NASAL | Status: DC | PRN
  Filled 2015-02-07: qty 16

## 2015-02-07 MED ORDER — CYCLOBENZAPRINE HCL 10 MG PO TABS
10.0000 mg | ORAL_TABLET | Freq: Every day | ORAL | Status: DC | PRN
  Administered 2015-02-08: 10 mg via ORAL
  Filled 2015-02-07 (×2): qty 1

## 2015-02-07 NOTE — ED Provider Notes (Signed)
CSN: [id number]     Arrival date & time 02/07/15  1509 History   First MD Initiated Contact with Patient 02/07/15 1510     Chief Complaint  Patient presents with  . Shortness of Breath  . Congestive Heart Failure     (Consider location/radiation/quality/duration/timing/severity/associated sxs/prior Treatment) Patient is a 55 y.o. male presenting with shortness of breath and CHF. The history is provided by the patient.  Shortness of Breath Congestive Heart Failure Associated symptoms include shortness of breath.  He comes in with cough for the last 2 weeks. Cough is productive of some yellowish sputum. This has been associated with about 21 pounds weight gain and he has noted some swelling in his ankles. He denies fever but states he has felt cold at times. There's been no chest pain and no nausea or vomiting. He saw his PCP this week and was referred to the ED for further evaluation. He does complain of worsening dyspnea. Nothing makes symptoms better nothing makes them worse.  Past Medical History  Diagnosis Date  . Asthma   . Hypertension   . Sleep apnea   . COPD (chronic obstructive pulmonary disease) ([place])   . Diabetes mellitus without complication ([place])    No past surgical history on file. Family History  Problem Relation Age of Onset  . Asthma Sister   . Asthma Other     nephew  . Hypertension Sister   . Diabetes type II Sister    Social History  Substance Use Topics  . Smoking status: Former Smoker -- 0.50 packs/day for .5 years    Types: Cigarettes    Start date: 07/28/1983    Quit date: 02/27/2013  . Smokeless tobacco: Not on file  . Alcohol Use: No     Comment: quit ETOH about 5 months ago-beer and liquor    Review of Systems  Respiratory: Positive for shortness of breath.   All other systems reviewed and are negative.     Allergies  Review of patient's allergies indicates no known allergies.  Home Medications   Prior to Admission medications    Medication Sig Start Date End Date Taking? Authorizing Provider  albuterol (PROVENTIL HFA;VENTOLIN HFA) 108 (90 BASE) MCG/ACT inhaler Inhale 2 puffs into the lungs every 4 (four) hours as needed for wheezing or shortness of breath. 12/30/14   Debbe Odea, MD  amLODipine (NORVASC) 10 MG tablet Take 1 tablet (10 mg total) by mouth daily. 12/30/14   Debbe Odea, MD  beclomethasone (QVAR) 40 MCG/ACT inhaler Inhale 2 puffs into the lungs daily. 07/18/13   Jennifer Piepenbrink, PA-C  Cholecalciferol (VITAMIN D3) 1000 UNITS CAPS Take 1 capsule by mouth daily.    Historical Provider, MD  cyclobenzaprine (FLEXERIL) 10 MG tablet Take 10 mg by mouth 3 (three) times daily as needed for muscle spasms.    Historical Provider, MD  dextromethorphan-guaiFENesin (MUCINEX DM) 30-600 MG 12hr tablet Take 1 tablet by mouth 2 (two) times daily as needed for cough (and chest congestion). 12/30/14   Debbe Odea, MD  doxycycline (VIBRAMYCIN) 50 MG capsule Take 1 capsule (50 mg total) by mouth 2 (two) times daily. 12/30/14   Debbe Odea, MD  fluticasone (FLONASE) 50 MCG/ACT nasal spray Place 2 sprays into both nostrils daily as needed for allergies.     Historical Provider, MD  gemfibrozil (LOPID) 600 MG tablet Take 600 mg by mouth 2 (two) times daily before a meal. Thirty minutes before morning and evening meal.    Historical Provider, MD  lisinopril-hydrochlorothiazide (PRINZIDE,ZESTORETIC) 20-25 MG tablet Take 1 tablet by mouth daily.     Historical Provider, MD  metFORMIN (GLUCOPHAGE) 500 MG tablet Take 1 tablet (500 mg total) by mouth 2 (two) times daily with a meal. 12/30/14   Debbe Odea, MD  omeprazole (PRILOSEC) 20 MG capsule Take 20 mg by mouth daily as needed (for acid reflex).     Historical Provider, MD  Saline (SIMPLY SALINE) 0.9 % AERS Place 2 sprays into the nose daily as needed (for allergies).    Historical Provider, MD  triamcinolone ointment (KENALOG) 0.1 % Apply 1 application topically 3 (three) times  daily.    Historical Provider, MD   BP 139/79 mmHg  Pulse 89  Resp 29  Ht 5\' 8"  (1.727 m)  Wt 400 lb 9.2 oz (181.7 kg)  BMI 60.92 kg/m2  SpO2 95% Physical Exam  Nursing note and vitals reviewed.  Morbidly obese 55 year old male, resting comfortably and in no acute distress. Vital signs are significant for tachypnea. Oxygen saturation is 94%, which is normal. Head is normocephalic and atraumatic. PERRLA, EOMI. Oropharynx is clear. Face is cushingoid. Neck is nontender and supple without adenopathy or JVD. Back is nontender and there is no CVA tenderness. Lungs have diffuse rhonchi. Wheezes are noted with coughing. There are no rales or rhonchi. Chest is nontender. Heart has regular rate and rhythm without murmur. Abdomen is soft, flat, nontender without masses or hepatosplenomegaly and peristalsis is normoactive. Extremities have 2-3+ edema, full range of motion is present. Moderate venous stasis changes are present. Skin is warm and dry without rash. Neurologic: Mental status is normal, cranial nerves are intact, there are no motor or sensory deficits.  ED Course  Procedures (including critical care time) Labs Review Results for orders placed or performed during the hospital encounter of [id number]  Basic metabolic panel  Result Value Ref Range   Sodium 143 135 - 145 mmol/L   Potassium 5.1 3.5 - 5.1 mmol/L   Chloride 98 (L) 101 - 111 mmol/L   CO2 35 (H) 22 - 32 mmol/L   Glucose, Bld 102 (H) 65 - 99 mg/dL   BUN 18 6 - 20 mg/dL   Creatinine, Ser 1.19 0.61 - 1.24 mg/dL   Calcium 9.4 8.9 - 10.3 mg/dL   GFR calc non Af Amer >60 >60 mL/min   GFR calc Af Amer >60 >60 mL/min   Anion gap 10 5 - 15  CBC  Result Value Ref Range   WBC 9.7 4.0 - 10.5 K/uL   RBC 4.01 (L) 4.22 - 5.81 MIL/uL   Hemoglobin 11.5 (L) 13.0 - 17.0 g/dL   HCT 38.6 (L) 39.0 - 52.0 %   MCV 96.3 78.0 - 100.0 fL   MCH 28.7 26.0 - 34.0 pg   MCHC 29.8 (L) 30.0 - 36.0 g/dL   RDW 17.9 (H) 11.5 - 15.5 %   Platelets  250 150 - 400 K/uL  Differential  Result Value Ref Range   Neutrophils Relative % 65 %   Neutro Abs 6.3 1.7 - 7.7 K/uL   Lymphocytes Relative 25 %   Lymphs Abs 2.4 0.7 - 4.0 K/uL   Monocytes Relative 8 %   Monocytes Absolute 0.7 0.1 - 1.0 K/uL   Eosinophils Relative 3 %   Eosinophils Absolute 0.2 0.0 - 0.7 K/uL   Basophils Relative 0 %   Basophils Absolute 0.0 0.0 - 0.1 K/uL  Brain natriuretic peptide  Result Value Ref Range   B Natriuretic Peptide  26.9 0.0 - 100.0 pg/mL  Troponin I  Result Value Ref Range   Troponin I <0.03 <0.031 ng/mL   Imaging Review Dg Chest 2 View  02/07/2015  CLINICAL DATA:  Shortness of breath, productive cough. EXAM: CHEST  2 VIEW COMPARISON:  December 28, 2014. FINDINGS: Stable cardiomegaly. No pneumothorax or pleural effusion is noted. No acute pulmonary disease is noted. Bony thorax is unremarkable. IMPRESSION: No active cardiopulmonary disease. Electronically Signed   By: Marijo Conception, M.D.   On: 02/07/2015 16:25   I have personally reviewed and evaluated these images and lab results as part of my medical decision-making.   EKG Interpretation   Date/Time:  Thursday February 07 2015 15:29:18 EST Ventricular Rate:  83 PR Interval:  164 QRS Duration: 91 QT Interval:  367 QTC Calculation: 431 R Axis:   76 Text Interpretation:  Sinus rhythm Low voltage, precordial leads When  compared with ECG of 07/18/2013, No significant change was found Confirmed  by [facility]  MD, Chin[place] ([id number]) on 02/07/2015 3:35:05 PM      MDM   Final diagnoses:  Dyspnea  COPD exacerbation (HCC)  Acute on chronic congestive heart failure, unspecified congestive heart failure type (HCC)  Metabolic alkalosis  Normochromic normocytic anemia   Cough and dyspnea. Patient has known history of CHF and certainly appears to be significantly fluid overloaded. Old records reviewed and he had been hospitalized last month for acute bronchitis. Patient does state that symptoms are  somewhat similar. Screening labs and x-ray are obtained and he'll be given albuterol with ipratropium via nebulizer.  5:14 PM Little subjective improvement from albuterol with ipratropium. On reexam, he still has considerable wheezing. BNP is come back to normal but is clearly fluid overloaded and is given a dose of furosemide. He is given a second nebulizer treatment.  Following furosemide, he had good diuresis and following second nebulizer treatment, lungs were much clearer. He seemed to be comfortable, but whenever his nasal oxygen was reduced to 2 L (the amount that he uses at home), history would desaturate to oxygen saturation levels of 87%. It is felt that he is going to need to stay in the hospital for ongoing diuresis. Case is discussed with Dr. Marvel Plan of internal medicine teaching service who agrees to admit the patient.  Delora Fuel, [place] [id number]

## 2015-02-07 NOTE — ED Notes (Signed)
Pt from PCP via [facility] with c/o worsening SOB with productive yellow cough x 2 weeks.  Pt has gained 21 lbs in the last two weeks as well.  Gross swelling noted to all of pt's body, including extremities, face, and abdomen.  Pt reports missing only last 2 days of lasix.  Diminished lung sounds.  Given 1 albuterol neb treatment at PCP office.  NAD, A&O.

## 2015-02-07 NOTE — ED Notes (Signed)
Attempted to call report x 1  

## 2015-02-07 NOTE — H&P (Signed)
Date: 02/07/2015               Patient Name:  Timothy Le MRN: [id number]  DOB: 09-22-1960 Age / Sex: 55 y.o., male   PCP: Kerin Perna, NP         Medical Service: Internal Medicine Teaching Service         Attending Physician: Dr. Aldine Contes, MD    First Contact: Dr. Burgess Estelle Pager: [phone]  Second Contact: Dr. 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 He was discharged 54 weeks ago for similar symptoms, felt well for a couple of weeks, and then started having cough productive of yellowish sputum, chest congestion, and a sore throat, with subsequent worsening shortness of breath at rest but exacerbated by activity, wheezing, chills, worsening leg swelling. He says he has gained ~20 pounds over the last few months. He also endorses orthopnea and PND, as he has to sit up straight in a chair to sleep. He says he hasn't slept flat for 'at least a year'. He says he has run out of some of his medications and inhalers over the past few days. Finally, he also says he has had 1-2 episodes of blood when he wipes after having a normal, non-bloody bowel movement. He does not have any pain with defecation. He denies fevers, chest pain, palpitations, abdominal pain, nausea, vomiting, diarrhea, urinary symptoms, new rashes, numbness, or other symptoms at this time.   In the ED, he is afebrile, initially 87% on his 2L but up to low 90s on 3L. He did not initially improve with breathing treatments. He was also given furosemide x1 in ED.   Meds: No current facility-administered medications  for this encounter.   Current Outpatient Prescriptions  Medication Sig Dispense Refill  . albuterol (PROVENTIL HFA;VENTOLIN HFA) 108 (90 BASE) MCG/ACT inhaler Inhale 2 puffs into the lungs every 4 (four) hours as needed for wheezing or shortness of breath. 1 Inhaler 2  . amLODipine (NORVASC) 10 MG tablet Take 1 tablet (10 mg total) by mouth daily. 30 tablet 0  . beclomethasone (QVAR) 40 MCG/ACT inhaler Inhale 2 puffs into the lungs daily. 1 Inhaler 12  . Cholecalciferol (VITAMIN D3) 1000 UNITS CAPS Take 1 capsule by mouth daily.    . cyclobenzaprine (FLEXERIL) 10 MG tablet Take 10 mg by mouth 3 (three) times daily as needed for muscle spasms.    Marland Kitchen dextromethorphan-guaiFENesin (MUCINEX DM) 30-600 MG 12hr tablet Take 1 tablet by mouth 2 (two) times daily as needed for cough (and chest congestion). 60 tablet 0  . fluticasone (FLONASE) 50 MCG/ACT nasal spray Place 2 sprays into both nostrils daily as needed for allergies.     Marland Kitchen gemfibrozil (LOPID) 600 MG tablet Take 600 mg by mouth 2 (two) times daily before a meal. Thirty minutes before morning and evening meal.    . HYDROcodone-acetaminophen (NORCO/VICODIN) 5-325 MG tablet Take 1 tablet by mouth every 6 (six) hours as needed for moderate pain.    Marland Kitchen lisinopril-hydrochlorothiazide (PRINZIDE,ZESTORETIC) 20-25 MG tablet Take 1 tablet by mouth daily.     Marland Kitchen  metFORMIN (GLUCOPHAGE) 500 MG tablet Take 1 tablet (500 mg total) by mouth 2 (two) times daily with a meal. 15 tablet 0  . omeprazole (PRILOSEC) 20 MG capsule Take 20 mg by mouth daily as needed (for acid reflex).     Marland Kitchen PRESCRIPTION MEDICATION Take 1 tablet by mouth daily.    . Saline (SIMPLY SALINE) 0.9 % AERS Place 2 sprays into the nose daily as needed (for allergies).    Marland Kitchen doxycycline (VIBRAMYCIN) 50 MG capsule Take 1 capsule (50 mg total) by mouth 2 (two) times daily. 4 capsule 0    Allergies: Allergies as of 02/07/2015  . (No Known Allergies)   Past Medical History  Diagnosis Date  .  Asthma   . Hypertension   . Sleep apnea   . COPD (chronic obstructive pulmonary disease) ([place])   . Diabetes mellitus without complication ([place])    History reviewed. No pertinent past surgical history. Family History  Problem Relation Age of Onset  . Asthma Sister   . Asthma Other     nephew  . Hypertension Sister   . Diabetes type II Sister    Social History   Social History  . Marital Status: Legally Separated    Spouse Name: N/A  . Number of Children: 1  . Years of Education: N/A   Occupational History  . unemployed     working on [employer]  . Smoking status: Former Smoker -- 0.50 packs/day for .5 years    Types: Cigarettes    Start date: 07/28/1983    Quit date: 02/27/2013  . Smokeless tobacco: Not on file  . Alcohol Use: No     Comment: quit ETOH about 5 months ago-beer and liquor  . Drug Use: No     Comment: quit 6 months-"pot"-smoked couple joints a week  . Sexual Activity: Not on file   Other Topics Concern  . Not on file   Social History Narrative    Review of Systems: Pertinent items noted in HPI and remainder of comprehensive ROS otherwise negative.  Physical Exam: Blood pressure 121/74, pulse 89, resp. rate 26, height 5\' 8"  (1.727 m), weight 400 lb 9.2 oz (181.7 kg), SpO2 94 %.   Gen: Morbidly obese, in mild discomfort, alert and oriented to person, place, and time HEENT: Oropharynx clear without erythema or exudate. Neck: No cervical LAD, no thyromegaly or nodules, no JVD noted. CV: Normal rate, regular rhythm, no murmurs, rubs, or gallops. Distant heart sounds.  Pulmonary: Normal effort, wheezes throughout bilaterally, mild bibasilar crackles. Abdominal: Soft, non-tender, distended, without rebound, guarding, or masses Extremities: PT and DP pulses faint, radial pulses 2+, 2+ pitting edema to midcalf, chronic stasis dermatitis changes in feet and legs bilaterally. Nonpitting edema in upper extremities. Skin: No  atypical appearing moles. No rashes  Lab results: Basic Metabolic Panel:  Recent Labs  02/07/15 1540  NA 143  K 5.1  CL 98*  CO2 35*  GLUCOSE 102*  BUN 18  CREATININE 1.19  CALCIUM 9.4   CBC:  Recent Labs  02/07/15 1540  WBC 9.7  NEUTROABS 6.3  HGB 11.5*  HCT 38.6*  MCV 96.3  PLT 250   Cardiac Enzymes:  Recent Labs  02/07/15 1540  TROPONINI <0.03   Imaging results:  Dg Chest 2 View  02/07/2015  CLINICAL DATA:  Shortness of breath, productive cough. EXAM: CHEST  2 VIEW COMPARISON:  December 28, 2014. FINDINGS: Stable cardiomegaly. No pneumothorax or pleural effusion is  noted. No acute pulmonary disease is noted. Bony thorax is unremarkable. IMPRESSION: No active cardiopulmonary disease. Electronically Signed   By: Marijo Conception, M.D.   On: 02/07/2015 16:25   Other results: EKG: normal EKG, normal sinus rhythm, unchanged from previous tracings.  Assessment & Plan by Problem: Active Problems:   COPD exacerbation ([place]) 1. Severe COPD Exacerbation - wheezing, shortness of breath, increased sputum production likely precipitated by URI +/- medication noncompliance. Recently hospitalized for similar symptoms. Gold 3D on home 2L, follows with Dr. Annamaria Boots outpatient. May also be worsened by apparent volume overload 2/2 likely CHF. -Cefepime for now; consider transition to oral antibiotic -Prednisone 40mg  -Tessalon, budesonide, duonebs, albuterol  -Robitussin DM PRN -Fluticasone PRN  2. Volume overload - also with PND, orthopnea, increased leg swelling and arm swelling, with chronic venous dermatitis changes, up ~20lbs since last admission. Has taken PRN furosemide 20mg  for leg swelling in the past. Likely 2/2 CHF. BNP not elevated but patient is morbidly obese, so may be falsely suppressed. CXR with poor penetration but read as negative for acute process. TTE was attempted at last admission but images were nearly uninterpretable given body habitus, with recommendations to do  TEE, cardiac MRI or cardiac CT to better assess LVF. This is most c/w apparent volume overload 2/2 likely CHF, perhaps contributing.  -Furosemide 40 IV BID -Daily weights, Is & Os -Consider above imaging for more accurate assessment, discussing with HF team   3. Severe OSA - NPSG 7/15 shows severe OSA with AHI 46.7/hr. PFTs 9/15 also show moderate restriction c/w OHS. Likely mixed picture, along with baseline COPD and likely CHF. Wears CPAP at home, questionable compliance due to discomfort. Was becoming hypoxic at last admission even with CPAP when he wasn't refusing, so hospitalists rec'd pulm f/u with repeat NPSG. -CPAP at night  4. HTN -Cont home amlodipine, lisinopril (takes lisinopril-HCTZ at home)  5. T2DM - last A1c 7.9 (12/16) -Hold home metformin -Sensitive SSI  Ppx - DVT: Lovenox  Dispo: Disposition is deferred at this time, awaiting improvement of current medical problems. Anticipated discharge in approximately 2-3 day(s).   The patient does have a current PCP (Kerin Perna, NP) and does need an [facility] hospital follow-up appointment after discharge.  The patient does have transportation limitations that hinder transportation to clinic appointments.  Signed: Norval Gable, MD 02/07/2015, 9:02 PM

## 2015-02-07 NOTE — Progress Notes (Signed)
Set pt up on Cpap 6.0 cm h20 pt states wears that at home when he wears it.  States he dont wear much at all.  Bled 3 lpm in .  Pt tolerating at this time

## 2015-02-07 NOTE — ED Notes (Signed)
Pt normally wears 2L o2 at home, stats 87% on 2L, put on 3L stats were 93%, then while ambulating on 3L pt stats were 91%

## 2015-02-07 NOTE — Progress Notes (Signed)
ANTIBIOTIC CONSULT NOTE - INITIAL  Pharmacy Consult for Cefepime Indication: COPD exac  No Known Allergies  Patient Measurements: Height: 5\' 8"  (172.7 cm) Weight: (!) 400 lb 9.2 oz (181.7 kg) IBW/kg (Calculated) : 68.4 Adjusted Body Weight:   Vital Signs: BP: 121/74 mmHg (01/19 2101) Pulse Rate: 89 (01/19 1900) Intake/Output from previous day:   Intake/Output from this shift: Total I/O In: 240 [P.O.:240] Out: 1200 [Urine:1200]  Labs:  Recent Labs  02/07/15 1540  WBC 9.7  HGB 11.5*  PLT 250  CREATININE 1.19   Estimated Creatinine Clearance: 115.5 mL/min (by C-G formula based on Cr of 1.19). No results for input(s): VANCOTROUGH, VANCOPEAK, VANCORANDOM, GENTTROUGH, GENTPEAK, GENTRANDOM, TOBRATROUGH, TOBRAPEAK, TOBRARND, AMIKACINPEAK, AMIKACINTROU, AMIKACIN in the last 72 hours.   Microbiology: No results found for this or any previous visit (from the past 720 hour(s)).  Medical History: Past Medical History  Diagnosis Date  . Asthma   . Hypertension   . Sleep apnea   . COPD (chronic obstructive pulmonary disease) ([place])   . Diabetes mellitus without complication ([place])     Medications:  Prescriptions prior to admission  Medication Sig Dispense Refill Last Dose  . albuterol (PROVENTIL HFA;VENTOLIN HFA) 108 (90 BASE) MCG/ACT inhaler Inhale 2 puffs into the lungs every 4 (four) hours as needed for wheezing or shortness of breath. 1 Inhaler 2 Past Week at Unknown time  . amLODipine (NORVASC) 10 MG tablet Take 1 tablet (10 mg total) by mouth daily. 30 tablet 0 02/07/2015 at Unknown time  . beclomethasone (QVAR) 40 MCG/ACT inhaler Inhale 2 puffs into the lungs daily. 1 Inhaler 12 Past Week at Unknown time  . Cholecalciferol (VITAMIN D3) 1000 UNITS CAPS Take 1 capsule by mouth daily.   02/07/2015 at Unknown time  . cyclobenzaprine (FLEXERIL) 10 MG tablet Take 10 mg by mouth 3 (three) times daily as needed for muscle spasms.   02/07/2015 at Unknown time  .  dextromethorphan-guaiFENesin (MUCINEX DM) 30-600 MG 12hr tablet Take 1 tablet by mouth 2 (two) times daily as needed for cough (and chest congestion). 60 tablet 0 Past Week at Unknown time  . fluticasone (FLONASE) 50 MCG/ACT nasal spray Place 2 sprays into both nostrils daily as needed for allergies.    Past Week at Unknown time  . gemfibrozil (LOPID) 600 MG tablet Take 600 mg by mouth 2 (two) times daily before a meal. Thirty minutes before morning and evening meal.   02/07/2015 at Unknown time  . HYDROcodone-acetaminophen (NORCO/VICODIN) 5-325 MG tablet Take 1 tablet by mouth every 6 (six) hours as needed for moderate pain.   02/06/2015 at Unknown time  . lisinopril-hydrochlorothiazide (PRINZIDE,ZESTORETIC) 20-25 MG tablet Take 1 tablet by mouth daily.    02/07/2015 at Unknown time  . metFORMIN (GLUCOPHAGE) 500 MG tablet Take 1 tablet (500 mg total) by mouth 2 (two) times daily with a meal. 15 tablet 0 02/07/2015 at Unknown time  . omeprazole (PRILOSEC) 20 MG capsule Take 20 mg by mouth daily as needed (for acid reflex).    Past Week at Unknown time  . PRESCRIPTION MEDICATION Take 1 tablet by mouth daily.   02/07/2015 at Unknown time  . Saline (SIMPLY SALINE) 0.9 % AERS Place 2 sprays into the nose daily as needed (for allergies).   unknown  . doxycycline (VIBRAMYCIN) 50 MG capsule Take 1 capsule (50 mg total) by mouth 2 (two) times daily. 4 capsule 0    Scheduled:  . [START ON 02/08/2015] amLODipine  10 mg Oral Daily  .  beclomethasone  2 puff Inhalation Daily  . benzonatate  100 mg Oral TID  . ceFEPime (MAXIPIME) IV  2 g Intravenous Q12H  . enoxaparin (LOVENOX) injection  40 mg Subcutaneous Q24H  . [START ON 02/08/2015] gemfibrozil  600 mg Oral BID AC  . guaiFENesin-dextromethorphan  5 mL Oral Q4H  . insulin aspart  0-5 Units Subcutaneous QHS  . [START ON 02/08/2015] insulin aspart  0-9 Units Subcutaneous TID WC  . ipratropium-albuterol  3 mL Nebulization Q4H  . pantoprazole  40 mg Oral Daily  .  predniSONE  40 mg Oral Q breakfast  . sodium chloride  3 mL Intravenous Q12H  . sodium chloride  3 mL Intravenous Q12H   Infusions:     Assessment: 55yo male presents with SOB. Pharmacy is consulted to dose cefepime for suspected COPD exacerbation.   Goal of Therapy:  Eradication of infection  Plan:  Cefepime 2g IV q12h Follow up culture results, renal function and clinical course  Timothy Le. Diona Foley, PharmD, [place] Pharmacist Pager [phone] 02/07/2015,9:23 PM

## 2015-02-07 NOTE — ED Notes (Signed)
Admitting MD at the bedside.  

## 2015-02-08 ENCOUNTER — Encounter (HOSPITAL_COMMUNITY): Payer: Self-pay | Admitting: General Practice

## 2015-02-08 DIAGNOSIS — Z9981 Dependence on supplemental oxygen: Secondary | ICD-10-CM

## 2015-02-08 DIAGNOSIS — E119 Type 2 diabetes mellitus without complications: Secondary | ICD-10-CM

## 2015-02-08 DIAGNOSIS — G4733 Obstructive sleep apnea (adult) (pediatric): Secondary | ICD-10-CM

## 2015-02-08 DIAGNOSIS — I509 Heart failure, unspecified: Secondary | ICD-10-CM | POA: Insufficient documentation

## 2015-02-08 DIAGNOSIS — Z9989 Dependence on other enabling machines and devices: Secondary | ICD-10-CM

## 2015-02-08 DIAGNOSIS — I10 Essential (primary) hypertension: Secondary | ICD-10-CM

## 2015-02-08 DIAGNOSIS — E8770 Fluid overload, unspecified: Secondary | ICD-10-CM

## 2015-02-08 LAB — GLUCOSE, CAPILLARY
GLUCOSE-CAPILLARY: 130 mg/dL — AB (ref 65–99)
GLUCOSE-CAPILLARY: 195 mg/dL — AB (ref 65–99)
Glucose-Capillary: 180 mg/dL — ABNORMAL HIGH (ref 65–99)
Glucose-Capillary: 149 mg/dL — ABNORMAL HIGH (ref 65–99)
Glucose-Capillary: 197 mg/dL — ABNORMAL HIGH (ref 65–99)

## 2015-02-08 LAB — BASIC METABOLIC PANEL
Anion gap: 11 (ref 5–15)
BUN: 22 mg/dL — AB (ref 6–20)
CHLORIDE: 94 mmol/L — AB (ref 101–111)
CO2: 33 mmol/L — AB (ref 22–32)
Calcium: 9.6 mg/dL (ref 8.9–10.3)
Creatinine, Ser: 1.23 mg/dL (ref 0.61–1.24)
GFR calc Af Amer: >60 mL/min (ref >60)
GFR calc non Af Amer: >60 mL/min (ref >60)
Glucose, Bld: 201 mg/dL — ABNORMAL HIGH (ref 65–99)
POTASSIUM: 4.9 mmol/L (ref 3.5–5.1)
SODIUM: 138 mmol/L (ref 135–145)

## 2015-02-08 LAB — CBC
HEMATOCRIT: 41.7 % (ref 39.0–52.0)
Hemoglobin: 12.1 g/dL — ABNORMAL LOW (ref 13.0–17.0)
MCH: 28.3 pg (ref 26.0–34.0)
MCHC: 29 g/dL — ABNORMAL LOW (ref 30.0–36.0)
MCV: 97.4 fL (ref 78.0–100.0)
Platelets: 250 10*3/uL (ref 150–400)
RBC: 4.28 MIL/uL (ref 4.22–5.81)
RDW: 18.5 % — AB (ref 11.5–15.5)
WBC: 10.3 10*3/uL (ref 4.0–10.5)

## 2015-02-08 MED ORDER — ENOXAPARIN SODIUM 100 MG/ML ~~LOC~~ SOLN
90.0000 mg | SUBCUTANEOUS | Status: DC
  Administered 2015-02-08 – 2015-02-09 (×2): 90 mg via SUBCUTANEOUS
  Filled 2015-02-08 (×2): qty 1

## 2015-02-08 MED ORDER — METHYLPREDNISOLONE SODIUM SUCC 40 MG IJ SOLR
40.0000 mg | Freq: Two times a day (BID) | INTRAMUSCULAR | Status: DC
  Administered 2015-02-08 (×2): 40 mg via INTRAVENOUS
  Filled 2015-02-08 (×2): qty 1

## 2015-02-08 MED ORDER — IPRATROPIUM-ALBUTEROL 0.5-2.5 (3) MG/3ML IN SOLN
3.0000 mL | Freq: Three times a day (TID) | RESPIRATORY_TRACT | Status: DC
  Administered 2015-02-08 – 2015-02-10 (×6): 3 mL via RESPIRATORY_TRACT
  Filled 2015-02-08 (×6): qty 3

## 2015-02-08 NOTE — Progress Notes (Signed)
Called stat to patients room patient coughing and very worked up and upset.  Had me remove Cpap mask and he stated that's why he never can wear it due to it causes him to cough and choke.Timothy Le Placed pt back ob nasal cannula.

## 2015-02-08 NOTE — Progress Notes (Addendum)
Subjective:  Patient seen and examined at bedside. There were no acute events overnight. He says that he was having few weeks of productive cough with yellow sputum, and progressively worsening shortness of breath for last 3 weeks. He has also noticed some weight gain for last week.  He ran out of his inhalers.  He is on baseline 2 L oxygen at home. He has OSA and been recommended CPAP in past,  But it is uncomfortable for him. He denies orthopnea but says he usually sleeps in a chair. No f/c/n/v.    Objective: Vital signs in last 24 hours: Filed Vitals:   02/08/15 0310 02/08/15 0700 02/08/15 0813 02/08/15 1238  BP:  128/82  121/75  Pulse:  101  88  Temp:  97.1 F (36.2 C)  98.1 F (36.7 C)  TempSrc:  Oral  Oral  Resp:  22  22  Height:      Weight:  395 lb 1.6 oz (179.216 kg)    SpO2: 95% 93% 93% 96%   Weight change:   Intake/Output Summary (Last 24 hours) at 02/08/15 1245 Last data filed at 02/08/15 1238  Gross per 24 hour  Intake   1010 ml  Output   3155 ml  Net  -2145 ml   General: Vital signs reviewed. Pt on 3L oxygen Cardiovascular: slightly tachy to 100, NSR   Pulmonary/Chest: Diffuse wheezes b/l with some fine crackles Abdominal: morbidly obese , nontender to palpation  Extremities: 2+ pitting edema to knee, pulses intact   Lab Results: Results for orders placed or performed during the hospital encounter of 02/07/15 (from the past 24 hour(s))  Basic metabolic panel     Status: Abnormal   Collection Time: 02/07/15  3:40 PM  Result Value Ref Range   Sodium 143 135 - 145 mmol/L   Potassium 5.1 3.5 - 5.1 mmol/L   Chloride 98 (L) 101 - 111 mmol/L   CO2 35 (H) 22 - 32 mmol/L   Glucose, Bld 102 (H) 65 - 99 mg/dL   BUN 18 6 - 20 mg/dL   Creatinine, Ser 1.19 0.61 - 1.24 mg/dL   Calcium 9.4 8.9 - 10.3 mg/dL   GFR calc non Af Amer >60 >60 mL/min   GFR calc Af Amer >60 >60 mL/min   Anion gap 10 5 - 15  CBC     Status: Abnormal   Collection Time: 02/07/15   3:40 PM  Result Value Ref Range   WBC 9.7 4.0 - 10.5 K/uL   RBC 4.01 (L) 4.22 - 5.81 MIL/uL   Hemoglobin 11.5 (L) 13.0 - 17.0 g/dL   HCT 38.6 (L) 39.0 - 52.0 %   MCV 96.3 78.0 - 100.0 fL   MCH 28.7 26.0 - 34.0 pg   MCHC 29.8 (L) 30.0 - 36.0 g/dL   RDW 17.9 (H) 11.5 - 15.5 %   Platelets 250 150 - 400 K/uL  Differential     Status: None   Collection Time: 02/07/15  3:40 PM  Result Value Ref Range   Neutrophils Relative % 65 %   Neutro Abs 6.3 1.7 - 7.7 K/uL   Lymphocytes Relative 25 %   Lymphs Abs 2.4 0.7 - 4.0 K/uL   Monocytes Relative 8 %   Monocytes Absolute 0.7 0.1 - 1.0 K/uL   Eosinophils Relative 3 %   Eosinophils Absolute 0.2 0.0 - 0.7 K/uL   Basophils Relative 0 %   Basophils Absolute 0.0 0.0 - 0.1 K/uL  Brain natriuretic peptide  Status: None   Collection Time: 02/07/15  3:40 PM  Result Value Ref Range   B Natriuretic Peptide 26.9 0.0 - 100.0 pg/mL  Troponin I     Status: None   Collection Time: 02/07/15  3:40 PM  Result Value Ref Range   Troponin I <0.03 <0.031 ng/mL  Glucose, capillary     Status: Abnormal   Collection Time: 02/07/15  9:43 PM  Result Value Ref Range   Glucose-Capillary 117 (H) 65 - 99 mg/dL  Basic metabolic panel     Status: Abnormal   Collection Time: 02/08/15  3:57 AM  Result Value Ref Range   Sodium 138 135 - 145 mmol/L   Potassium 4.9 3.5 - 5.1 mmol/L   Chloride 94 (L) 101 - 111 mmol/L   CO2 33 (H) 22 - 32 mmol/L   Glucose, Bld 201 (H) 65 - 99 mg/dL   BUN 22 (H) 6 - 20 mg/dL   Creatinine, Ser 1.23 0.61 - 1.24 mg/dL   Calcium 9.6 8.9 - 10.3 mg/dL   GFR calc non Af Amer >60 >60 mL/min   GFR calc Af Amer >60 >60 mL/min   Anion gap 11 5 - 15  CBC     Status: Abnormal   Collection Time: 02/08/15  3:57 AM  Result Value Ref Range   WBC 10.3 4.0 - 10.5 K/uL   RBC 4.28 4.22 - 5.81 MIL/uL   Hemoglobin 12.1 (L) 13.0 - 17.0 g/dL   HCT 41.7 39.0 - 52.0 %   MCV 97.4 78.0 - 100.0 fL   MCH 28.3 26.0 - 34.0 pg   MCHC 29.0 (L) 30.0 - 36.0 g/dL    RDW 18.5 (H) 11.5 - 15.5 %   Platelets 250 150 - 400 K/uL  Glucose, capillary     Status: Abnormal   Collection Time: 02/08/15  5:56 AM  Result Value Ref Range   Glucose-Capillary 180 (H) 65 - 99 mg/dL  Glucose, capillary     Status: Abnormal   Collection Time: 02/08/15 11:40 AM  Result Value Ref Range   Glucose-Capillary 149 (H) 65 - 99 mg/dL   Comment 1 Notify RN   Glucose, capillary     Status: Abnormal   Collection Time: 02/08/15 12:47 PM  Result Value Ref Range   Glucose-Capillary 130 (H) 65 - 99 mg/dL      Micro Results: No results found for this or any previous visit (from the past 240 hour(s)). Studies/Results: Dg Chest 2 View  02/07/2015  CLINICAL DATA:  Shortness of breath, productive cough. EXAM: CHEST  2 VIEW COMPARISON:  December 28, 2014. FINDINGS: Stable cardiomegaly. No pneumothorax or pleural effusion is noted. No acute pulmonary disease is noted. Bony thorax is unremarkable. IMPRESSION: No active cardiopulmonary disease. Electronically Signed   By: Marijo Conception, M.D.   On: 02/07/2015 16:25   Medications: I have reviewed the patient's current medications. Scheduled Meds: . amLODipine  10 mg Oral Daily  . benzonatate  100 mg Oral TID  . budesonide (PULMICORT) nebulizer solution  0.25 mg Nebulization BID  . ceFEPime (MAXIPIME) IV  2 g Intravenous Q12H  . enoxaparin (LOVENOX) injection  90 mg Subcutaneous Q24H  . furosemide  40 mg Intravenous BID  . gemfibrozil  600 mg Oral BID AC  . guaiFENesin-dextromethorphan  5 mL Oral Q4H  . insulin aspart  0-5 Units Subcutaneous QHS  . insulin aspart  0-9 Units Subcutaneous TID WC  . ipratropium-albuterol  3 mL Nebulization TID  . lisinopril  20 mg Oral Daily  . methylPREDNISolone (SOLU-MEDROL) injection  40 mg Intravenous Q12H  . pantoprazole  40 mg Oral Daily  . sodium chloride  3 mL Intravenous Q12H  . sodium chloride  3 mL Intravenous Q12H   Continuous Infusions:  PRN Meds:.sodium chloride, acetaminophen  **OR** acetaminophen, albuterol, cyclobenzaprine, fluticasone, sodium chloride Assessment/Plan: Active Problems:   COPD exacerbation ([facility])   Acute on chronic congestive heart failure ([facility])  Severe COPD Exacerbation - wheezing, shortness of breath, increased sputum production likely precipitated by URI +/- medication noncompliance. Recently hospitalized for similar symptoms. Gold 3D on home 2L, follows with Dr. Annamaria Boots outpatient. May also be worsened by apparent volume overload 2/2 likely CHF. He has no leukocytosis and fevers or other systemic symptoms.  -Cefepime  -Prednisone 40mg  d/ced and now on IV solumedrol 40 mg bid , can transition back to prednisone in AM -Duonebs TID -Albuterol q4 PRN -Pulmicort -Tessalon, Robitussin DM PRN, and Fluticasone PRN  2. Volume overload - also with PND,up ~20lbs since last admission. Discharge weight was 387, currently is 395.  Has taken daily, not PRN furosemide 20mg  for leg swelling in the past given by his PCP . Likely 2/2 CHF. BNP not elevated.  CXR difficult to interpret due to body habitus. TTE was attempted at last admission but images were nearly uninterpretable given body habitus, with recommendations to do TEE, cardiac MRI or cardiac CT to better assess LVF.  -Overnight urine output about 2 L and net -1300 mL  -Furosemide 40 IV BID -Daily weights -strict intake and output   3. Severe OSA - NPSG 7/15 shows severe OSA with AHI 46.7/hr. PFTs 9/15 also show moderate restriction c/w OHS. Likely mixed picture, along with baseline COPD and likely CHF. Wears CPAP at home. Per pt is is uncomfortable.  -CPAP at night  4. HTN:  BP has been stable at 121/75  -Cont home amlodipine, lisinopril (takes lisinopril-HCTZ at home) -on lasix IV for diuresis   5. T2DM - last A1c 7.9 (12/16)  -CBGs have been stable   -Hold home metformin -Sensitive SSI  F- none E-none  N - HH thin Ppx - DVT: Lovenox  Code- full   Dispo: Disposition is deferred at  this time, awaiting improvement of current medical problems.  Anticipated discharge in approximately 1 day(s).   The patient does have a current PCP (Kerin Perna, NP) and does need an [facility] hospital follow-up appointment after discharge.  The patient does not have transportation limitations that hinder transportation to clinic appointments.  .Services Needed at time of discharge: Y = Yes, Blank = No PT:   OT:   RN:   Equipment:   Other:     LOS: 1 day   Burgess Estelle, MD 02/08/2015, 12:45 PM

## 2015-02-08 NOTE — Progress Notes (Signed)
Patient refusing CPAP tonight. Stated he felt like he could not breath with CPAP on, RT asked if he would like to try again. Pt refused

## 2015-02-08 NOTE — Clinical Documentation Improvement (Signed)
Internal Medicine  Can the diagnosis of CHF be further specified?    Acuity - Acute, Chronic, Acute on Chronic   Type - Systolic, Diastolic, Systolic and Diastolic  Other  Clinically Undetermined   Document any associated diagnoses/conditions   Supporting Information: Possible acute on chronic CHF per 01/20 progress notes. Patient with a known history of CHF, appears significantly volume overloaded per 01/19 ED note. 01/19: BNP: 26.9   Please exercise your independent, professional judgment when responding. A specific answer is not anticipated or expected.   Thank You,  [place] [phone]

## 2015-02-08 NOTE — Progress Notes (Signed)
Utilization review completed. Eris Hannan, RN, BSN. 

## 2015-02-09 LAB — BASIC METABOLIC PANEL
ANION GAP: 9 (ref 5–15)
BUN: 29 mg/dL — ABNORMAL HIGH (ref 6–20)
CO2: 37 mmol/L — ABNORMAL HIGH (ref 22–32)
Calcium: 9 mg/dL (ref 8.9–10.3)
Chloride: 91 mmol/L — ABNORMAL LOW (ref 101–111)
Creatinine, Ser: 1.31 mg/dL — ABNORMAL HIGH (ref 0.61–1.24)
GFR calc Af Amer: >60 mL/min (ref >60)
GLUCOSE: 251 mg/dL — AB (ref 65–99)
POTASSIUM: 4.7 mmol/L (ref 3.5–5.1)
Sodium: 137 mmol/L (ref 135–145)

## 2015-02-09 LAB — CBC
HCT: 39.3 % (ref 39.0–52.0)
Hemoglobin: 11.7 g/dL — ABNORMAL LOW (ref 13.0–17.0)
MCH: 28.7 pg (ref 26.0–34.0)
MCHC: 29.8 g/dL — ABNORMAL LOW (ref 30.0–36.0)
MCV: 96.3 fL (ref 78.0–100.0)
PLATELETS: 284 10*3/uL (ref 150–400)
RBC: 4.08 MIL/uL — AB (ref 4.22–5.81)
RDW: 18.5 % — ABNORMAL HIGH (ref 11.5–15.5)
WBC: 10 10*3/uL (ref 4.0–10.5)

## 2015-02-09 LAB — GLUCOSE, CAPILLARY
GLUCOSE-CAPILLARY: 216 mg/dL — AB (ref 65–99)
GLUCOSE-CAPILLARY: 211 mg/dL — AB (ref 65–99)
Glucose-Capillary: 152 mg/dL — ABNORMAL HIGH (ref 65–99)
Glucose-Capillary: 203 mg/dL — ABNORMAL HIGH (ref 65–99)

## 2015-02-09 MED ORDER — FUROSEMIDE 40 MG PO TABS
40.0000 mg | ORAL_TABLET | Freq: Two times a day (BID) | ORAL | Status: DC
  Administered 2015-02-09 – 2015-02-10 (×2): 40 mg via ORAL
  Filled 2015-02-09 (×2): qty 1

## 2015-02-09 MED ORDER — PREDNISONE 20 MG PO TABS
40.0000 mg | ORAL_TABLET | Freq: Every day | ORAL | Status: DC
  Administered 2015-02-09 – 2015-02-10 (×2): 40 mg via ORAL
  Filled 2015-02-09 (×2): qty 2

## 2015-02-09 NOTE — Care Management Note (Signed)
Case Management Note  Patient Details  Name: Timothy Le MRN: [id number] Date of Birth: 08-23-1960  Subjective/Objective:                  shortness of breath Action/Plan: Discharge planning Expected Discharge Date:                  Expected Discharge Plan:  [place]  In-House Referral:     Discharge planning Services  CM Consult, Medication Assistance  Post Acute Care Choice:    Choice offered to:     DME Arranged:    DME Agency:     HH Arranged:    HH Agency:     Status of Service:  In process, will continue to follow  Medicare Important Message Given:    Date Medicare IM Given:    Medicare IM give by:    Date Additional Medicare IM Given:    Additional Medicare Important Message give by:     If discussed at [place] of Stay Meetings, dates discussed:    Additional Comments: CM consulted for med asst for pt.  Cm met with pt who states one of the inhalers(of which he cannot remember name of) was not covered by Medicaid.  MD PLEASE CONSIDER OPTION OF A DME NEBULIZER MACHINE WITH [employer] as pt is MEDICAID and will be covered by Medicaid.  CM will continue to follow for needs. Dellie Catholic, RN 02/09/2015, 11:39 AM

## 2015-02-09 NOTE — Evaluation (Signed)
Physical Therapy Evaluation Patient Details Name: Timothy Le MRN: [id number] DOB: 31-Aug-1960 Today's Date: 02/09/2015   History of Present Illness  55 y/o male with PMH of morbid obesity, COPD on home O2, OSA, HTN, DM who p/w worsening SOB and cough over the last 2-3 weeks. Admitted wiht severe COPD exacerbation and volume overload.  Clinical Impression  Patient independent with all mobility, only limited by SOB during exertion.  No further PT needs identified.  Will sign off.    Follow Up Recommendations No PT follow up    Equipment Recommendations  None recommended by PT    Recommendations for Other Services       Precautions / Restrictions Precautions Precautions: None      Mobility  Bed Mobility Overal bed mobility: Independent                Transfers Overall transfer level: Independent Equipment used: None                Ambulation/Gait Ambulation/Gait assistance: Independent Ambulation Distance (Feet): 150 Feet Assistive device: None Gait Pattern/deviations: WFL(Within Functional Limits);Wide base of support     General Gait Details: did require one rest break due to SOB.  O2 sats were 95%  Stairs            Wheelchair Mobility    Modified Rankin (Stroke Patients Only)       Balance Overall balance assessment: No apparent balance deficits (not formally assessed)                                           Pertinent Vitals/Pain Pain Assessment: No/denies pain    Home Living Family/patient expects to be discharged to:: Private residence Living Arrangements: Alone   Type of Home: Apartment Home Access: Stairs to enter Entrance Stairs-Rails: Right Entrance Stairs-Number of Steps: 14 Home Layout: One level Home Equipment: None      Prior Function Level of Independence: Independent               Hand Dominance        Extremity/Trunk Assessment   Upper Extremity Assessment: Overall WFL for  tasks assessed           Lower Extremity Assessment: Overall WFL for tasks assessed      Cervical / Trunk Assessment: Normal  Communication   Communication: No difficulties  Cognition Arousal/Alertness: Awake/alert Behavior During Therapy: WFL for tasks assessed/performed Overall Cognitive Status: Within Functional Limits for tasks assessed                      General Comments      Exercises        Assessment/Plan    PT Assessment [place] does not need any further PT services  PT Diagnosis Generalized weakness   PT Problem List    PT Treatment Interventions     PT Goals (Current goals can be found in the Care Plan section) Acute Rehab PT Goals PT Goal Formulation: All assessment and education complete, DC therapy    Frequency     Barriers to discharge        Co-evaluation               End of Session   Activity Tolerance: Patient tolerated treatment well Patient left: in bed;with call bell/phone within reach  Time: 1208-1221 PT Time Calculation (min) (ACUTE ONLY): 13 min   Charges:   PT Evaluation $PT Eval Low Complexity: 1 Procedure     PT G CodesShanna Cisco 02/09/2015, 12:51 PM  02/09/2015 [place], [place]

## 2015-02-09 NOTE — Progress Notes (Signed)
Subjective: Pt states he his breathing has minimally improved. Denies difficulty urinating and chest pain. Feels that his pedal edema has gone down.    Objective: Vital signs in last 24 hours: Filed Vitals:   02/08/15 2016 02/08/15 2024 02/09/15 0307 02/09/15 0415  BP: 125/64   129/63  Pulse: 95   92  Temp: 98 F (36.7 C)   98 F (36.7 C)  TempSrc: Oral   Oral  Resp: 18   18  Height:      Weight:    389 lb 3.2 oz (176.54 kg)  SpO2: 98% 97% 95% 98%   Weight change: -11 lb 6 oz (-5.16 kg)  Intake/Output Summary (Last 24 hours) at 02/09/15 0847 Last data filed at 02/09/15 0729  Gross per 24 hour  Intake   1080 ml  Output   2625 ml  Net  -1545 ml   General: NAD, on 3L oxygen [place] HEENT: moist mucous membranes Cardiovascular: RRR, no rubs, murmurs, or gallops Pulmonary/Chest: no wheezing or crackles, CTAB Abdominal: morbidly obese , nontender to palpation, active bowel sounds Extremities: trace pedal edema   Lab Results: Results for orders placed or performed during the hospital encounter of 02/07/15 (from the past 24 hour(s))  Glucose, capillary     Status: Abnormal   Collection Time: 02/08/15 11:40 AM  Result Value Ref Range   Glucose-Capillary 149 (H) 65 - 99 mg/dL   Comment 1 Notify RN   Glucose, capillary     Status: Abnormal   Collection Time: 02/08/15 12:47 PM  Result Value Ref Range   Glucose-Capillary 130 (H) 65 - 99 mg/dL  Glucose, capillary     Status: Abnormal   Collection Time: 02/08/15  4:53 PM  Result Value Ref Range   Glucose-Capillary 197 (H) 65 - 99 mg/dL   Comment 1 Notify RN   Glucose, capillary     Status: Abnormal   Collection Time: 02/08/15  9:41 PM  Result Value Ref Range   Glucose-Capillary 195 (H) 65 - 99 mg/dL  CBC     Status: Abnormal   Collection Time: 02/09/15  3:22 AM  Result Value Ref Range   WBC 10.0 4.0 - 10.5 K/uL   RBC 4.08 (L) 4.22 - 5.81 MIL/uL   Hemoglobin 11.7 (L) 13.0 - 17.0 g/dL   HCT 39.3 39.0 - 52.0 %   MCV 96.3  78.0 - 100.0 fL   MCH 28.7 26.0 - 34.0 pg   MCHC 29.8 (L) 30.0 - 36.0 g/dL   RDW 18.5 (H) 11.5 - 15.5 %   Platelets 284 150 - 400 K/uL  Basic metabolic panel     Status: Abnormal   Collection Time: 02/09/15  3:22 AM  Result Value Ref Range   Sodium 137 135 - 145 mmol/L   Potassium 4.7 3.5 - 5.1 mmol/L   Chloride 91 (L) 101 - 111 mmol/L   CO2 37 (H) 22 - 32 mmol/L   Glucose, Bld 251 (H) 65 - 99 mg/dL   BUN 29 (H) 6 - 20 mg/dL   Creatinine, Ser 1.31 (H) 0.61 - 1.24 mg/dL   Calcium 9.0 8.9 - 10.3 mg/dL   GFR calc non Af Amer >60 >60 mL/min   GFR calc Af Amer >60 >60 mL/min   Anion gap 9 5 - 15  Glucose, capillary     Status: Abnormal   Collection Time: 02/09/15  6:33 AM  Result Value Ref Range   Glucose-Capillary 216 (H) 65 - 99 mg/dL  Studies/Results: Dg Chest 2 View  02/07/2015  CLINICAL DATA:  Shortness of breath, productive cough. EXAM: CHEST  2 VIEW COMPARISON:  December 28, 2014. FINDINGS: Stable cardiomegaly. No pneumothorax or pleural effusion is noted. No acute pulmonary disease is noted. Bony thorax is unremarkable. IMPRESSION: No active cardiopulmonary disease. Electronically Signed   By: Marijo Conception, M.D.   On: 02/07/2015 16:25   Medications: I have reviewed the patient's current medications. Scheduled Meds: . amLODipine  10 mg Oral Daily  . benzonatate  100 mg Oral TID  . budesonide (PULMICORT) nebulizer solution  0.25 mg Nebulization BID  . ceFEPime (MAXIPIME) IV  2 g Intravenous Q12H  . enoxaparin (LOVENOX) injection  90 mg Subcutaneous Q24H  . furosemide  40 mg Intravenous BID  . gemfibrozil  600 mg Oral BID AC  . guaiFENesin-dextromethorphan  5 mL Oral Q4H  . insulin aspart  0-5 Units Subcutaneous QHS  . insulin aspart  0-9 Units Subcutaneous TID WC  . ipratropium-albuterol  3 mL Nebulization TID  . lisinopril  20 mg Oral Daily  . methylPREDNISolone (SOLU-MEDROL) injection  40 mg Intravenous Q12H  . pantoprazole  40 mg Oral Daily  . sodium chloride   3 mL Intravenous Q12H  . sodium chloride  3 mL Intravenous Q12H   Continuous Infusions:  PRN Meds:.sodium chloride, acetaminophen **OR** acetaminophen, albuterol, cyclobenzaprine, fluticasone, sodium chloride Assessment/Plan: Active Problems:   COPD exacerbation (HCC)   Acute on chronic congestive heart failure (HCC)  Severe COPD Exacerbation - wheezing, shortness of breath, increased sputum production likely precipitated by URI +/- medication noncompliance. Recently hospitalized for similar symptoms. Gold 3D on home 2L, follows with Dr. Annamaria Boots outpatient. May also be worsened by apparent volume overload 2/2 likely CHF. He has no leukocytosis and fevers or other systemic symptoms. - Cefepime 2g BID - received 2 dose of solumedrol 40mg  yesterday, no wheezing on exam today, transitioned to prednisone 40mg  daily x 5 days (stop date 1/25) -Duonebs TID -Albuterol q4 PRN -Pulmicort -Tessalon, Robitussin DM PRN, and Fluticasone PRN  2. Volume overload -  Likely 2/2 CHF. BNP not elevated. CXR difficult to interpret due to body habitus. TTE was attempted at last admission but images were nearly uninterpretable given body habitus, with recommendations to do TEE, cardiac MRI or cardiac CT to better assess LVF, however read as probably normal LV function. Weight down to 389lbs from 395 yesterday and 2.2L UOP. Creatinine has steadily increased as well as HCO3, will transition to po lasix 40mg  BID as he may be volume contracted.  -Daily weights, strict intake and output   3. Severe OSA - NPSG 7/15 shows severe OSA with AHI 46.7/hr. PFTs 9/15 also show moderate restriction c/w OHS. Likely mixed picture, along with baseline COPD and likely CHF. Wears CPAP at home. Per pt is is uncomfortable. -CPAP at night  4. HTN:  BP has been stable at 121/75  -Cont home amlodipine, lisinopril (takes lisinopril-HCTZ at home)  5. T2DM - last A1c 7.9 (12/16)  -Sensitive SSI  Ppx - DVT: Lovenox  Code- full   Dispo:    Anticipated discharge in approximately 1 day(s). PT consulted.    The patient does have a current PCP (Kerin Perna, NP) and does need an [facility] hospital follow-up appointment after discharge.  The patient does not have transportation limitations that hinder transportation to clinic appointments.  .Services Needed at time of discharge: Y = Yes, Blank = No PT:   OT:   RN:   Equipment:  Other:     LOS: 2 days   Norman Herrlich, MD 02/09/2015, 8:47 AM

## 2015-02-10 DIAGNOSIS — I509 Heart failure, unspecified: Secondary | ICD-10-CM

## 2015-02-10 DIAGNOSIS — J441 Chronic obstructive pulmonary disease with (acute) exacerbation: Principal | ICD-10-CM

## 2015-02-10 LAB — BASIC METABOLIC PANEL
ANION GAP: 11 (ref 5–15)
BUN: 30 mg/dL — ABNORMAL HIGH (ref 6–20)
CHLORIDE: 92 mmol/L — AB (ref 101–111)
CO2: 39 mmol/L — AB (ref 22–32)
Calcium: 8.9 mg/dL (ref 8.9–10.3)
Creatinine, Ser: 1.2 mg/dL (ref 0.61–1.24)
GFR calc Af Amer: >60 mL/min (ref >60)
GLUCOSE: 128 mg/dL — AB (ref 65–99)
POTASSIUM: 4.3 mmol/L (ref 3.5–5.1)
Sodium: 142 mmol/L (ref 135–145)

## 2015-02-10 LAB — GLUCOSE, CAPILLARY
Glucose-Capillary: 113 mg/dL — ABNORMAL HIGH (ref 65–99)
Glucose-Capillary: 277 mg/dL — ABNORMAL HIGH (ref 65–99)

## 2015-02-10 MED ORDER — CHLORPHENIRAMINE MALEATE 4 MG PO TABS: 4.0000 mg | ORAL_TABLET | Freq: Four times a day (QID) | ORAL | Status: DC | PRN

## 2015-02-10 MED ORDER — LEVOFLOXACIN 750 MG PO TABS
750.0000 mg | ORAL_TABLET | Freq: Every day | ORAL | Status: DC
Start: 2015-02-10 — End: 2015-03-27

## 2015-02-10 MED ORDER — PREDNISONE 20 MG PO TABS: 40.0000 mg | ORAL_TABLET | Freq: Every day | ORAL | Status: DC

## 2015-02-10 MED ORDER — LEVOFLOXACIN 750 MG PO TABS
750.0000 mg | ORAL_TABLET | Freq: Every day | ORAL | Status: DC
  Administered 2015-02-10: 750 mg via ORAL
  Filled 2015-02-10: qty 1

## 2015-02-10 MED ORDER — FUROSEMIDE 40 MG PO TABS: 40.0000 mg | ORAL_TABLET | Freq: Two times a day (BID) | ORAL | Status: DC

## 2015-02-10 NOTE — Progress Notes (Signed)
Subjective: Pt states he feels a lot better compared to yesterday, pedal and abdominal swelling has decreased, and his breathing has improved. He coughed up white sputum during the exam.    Objective: Vital signs in last 24 hours: Filed Vitals:   02/09/15 2034 02/10/15 0402 02/10/15 0806 02/10/15 0902  BP: 126/72 127/84 109/67   Pulse: 102 84 85   Temp: 98 F (36.7 C) 97.2 F (36.2 C)    TempSrc: Oral Oral    Resp: 18 18 22    Height:      Weight:  389 lb 1.6 oz (176.495 kg)    SpO2: 98% 98% 95% 95%   Weight change: -1.6 oz (-0.045 kg)  Intake/Output Summary (Last 24 hours) at 02/10/15 1005 Last data filed at 02/10/15 0647  Gross per 24 hour  Intake   1220 ml  Output   1425 ml  Net   -205 ml   General: NAD, on 2L oxygen [place], sitting up on edge of bed eating breakfast HEENT: moist mucous membranes Cardiovascular: RRR, no rubs, murmurs, or gallops Pulmonary/Chest: no wheezing or crackles, CTAB Abdominal: morbidly obese , nontender to palpation, active bowel sounds Extremities: trace pedal edema   Lab Results: Results for orders placed or performed during the hospital encounter of 02/07/15 (from the past 24 hour(s))  Glucose, capillary     Status: Abnormal   Collection Time: 02/09/15 11:35 AM  Result Value Ref Range   Glucose-Capillary 152 (H) 65 - 99 mg/dL   Comment 1 Notify RN   Glucose, capillary     Status: Abnormal   Collection Time: 02/09/15  4:29 PM  Result Value Ref Range   Glucose-Capillary 211 (H) 65 - 99 mg/dL   Comment 1 Notify RN   Glucose, capillary     Status: Abnormal   Collection Time: 02/09/15  9:31 PM  Result Value Ref Range   Glucose-Capillary 203 (H) 65 - 99 mg/dL  Basic metabolic panel     Status: Abnormal   Collection Time: 02/10/15  4:45 AM  Result Value Ref Range   Sodium 142 135 - 145 mmol/L   Potassium 4.3 3.5 - 5.1 mmol/L   Chloride 92 (L) 101 - 111 mmol/L   CO2 39 (H) 22 - 32 mmol/L   Glucose, Bld 128 (H) 65 - 99 mg/dL   BUN 30  (H) 6 - 20 mg/dL   Creatinine, Ser 1.20 0.61 - 1.24 mg/dL   Calcium 8.9 8.9 - 10.3 mg/dL   GFR calc non Af Amer >60 >60 mL/min   GFR calc Af Amer >60 >60 mL/min   Anion gap 11 5 - 15  Glucose, capillary     Status: Abnormal   Collection Time: 02/10/15  6:46 AM  Result Value Ref Range   Glucose-Capillary 113 (H) 65 - 99 mg/dL      Studies/Results: No results found. Medications: I have reviewed the patient's current medications. Scheduled Meds: . amLODipine  10 mg Oral Daily  . benzonatate  100 mg Oral TID  . budesonide (PULMICORT) nebulizer solution  0.25 mg Nebulization BID  . ceFEPime (MAXIPIME) IV  2 g Intravenous Q12H  . enoxaparin (LOVENOX) injection  90 mg Subcutaneous Q24H  . furosemide  40 mg Oral BID  . gemfibrozil  600 mg Oral BID AC  . guaiFENesin-dextromethorphan  5 mL Oral Q4H  . insulin aspart  0-5 Units Subcutaneous QHS  . insulin aspart  0-9 Units Subcutaneous TID WC  . ipratropium-albuterol  3 mL Nebulization  TID  . lisinopril  20 mg Oral Daily  . pantoprazole  40 mg Oral Daily  . predniSONE  40 mg Oral Q breakfast  . sodium chloride  3 mL Intravenous Q12H  . sodium chloride  3 mL Intravenous Q12H   Continuous Infusions:  PRN Meds:.sodium chloride, acetaminophen **OR** acetaminophen, albuterol, cyclobenzaprine, fluticasone, sodium chloride Assessment/Plan: Active Problems:   COPD exacerbation (HCC)   Acute on chronic congestive heart failure (HCC)  COPD Exacerbation -  - stopped Cefepime 2g BID ( received 2.5 days), started on levaquin 750mg  qd today ( complicated COPD-  Cardiac disease)  which he will continue for 3 more days.  - received 2 dose of solumedrol 40mg  yesterday, no wheezing on exam today, transitioned to prednisone 40mg  daily x 5 days (stop date 1/25) -Duonebs TID -Albuterol q4 PRN -Pulmicort - will send pt home w/ mucinex and chlorpheniramine for congestion  2. Volume overload - 2/2 CHF. Weight down to 389lbs from 400 on admission which  is near is last discharge weaight of 387lbs, he is net negative 3L since admission. Pt's edema has decreased and breathing has improved. - will send pt home w/ lasix 40mg  BID.   3. Severe OSA - NPSG 7/15 shows severe OSA with AHI 46.7/hr. PFTs 9/15 also show moderate restriction c/w OHS. Likely mixed picture, along with baseline COPD and likely CHF. Wears CPAP at home. Per pt is is uncomfortable. -CPAP at night  4. HTN:  BP has been stable at 121/75  -Cont home amlodipine, lisinopril (takes lisinopril-HCTZ at home)  5. T2DM - last A1c 7.9 (12/16)  -Sensitive SSI  Ppx - DVT: Lovenox  Code- full   Dispo:   Discharge home today, pt advised to make a hospital follow up with his PCP later this week which he states he will have no difficulties in doing so.    The patient does have a current PCP (Kerin Perna, NP) and does need an [facility] hospital follow-up appointment after discharge.  The patient does not have transportation limitations that hinder transportation to clinic appointments.  .Services Needed at time of discharge: Y = Yes, Blank = No PT:   OT:   RN:   Equipment:   Other:     LOS: 3 days   Norman Herrlich, MD 02/10/2015, 10:05 AM

## 2015-02-10 NOTE — Discharge Instructions (Signed)
Start taking lasix 40mg  twice a day. Make sure to weigh yourself daily and if you notice a 3-5 pound increase in weight call your PCP.   Start taking levaquin 750mg  once daily beginning tomorrow for a total of 2 more days.   Start taking prednisone 40mg  tomorrow for a total of 3 more days.   You can continue taking mucinex for your cough and congestion. I have prescribed chlorpheniramine 4mg  that you can take up to 4 times a day as needed for your congestions. This medication will make you drowsy so do not operate heavy machinery while taking this medication.   Chlorpheniramine tablets What is this medicine? CHLORPHENIRAMINE (klor fen IR a meen) is an antihistamine. It is used to treat a runny nose from allergies or a cold. It is also used to treat the symptoms an allergic reaction. This medicine will not treat an infection. This medicine may be used for other purposes; ask your health care provider or pharmacist if you have questions. What should I tell my health care provider before I take this medicine? They need to know if you have any of these conditions: -glaucoma -heart disease -high blood pressure -lung or breathing disease, like asthma -pain or difficulty passing urine -prostate trouble -ulcers or other stomach problems -an unusual or allergic reaction to chlorpheniramine, other medicines, foods, dyes, or preservatives -pregnant or trying to get pregnant -breast-feeding How should I use this medicine? Take this medicine by mouth with a full glass of water. Follow the directions on the prescription label. Take your doses at regular intervals. Do not take your medicine more often than directed. Talk to your pediatrician regarding the use of this medicine in children. While this drug may be prescribed for selected conditions, precautions do apply. Patients over 70 years old may have a stronger reaction and need a smaller dose. Overdosage: If you think you have taken too much of this  medicine contact a poison control center or emergency room at once. NOTE: This medicine is only for you. Do not share this medicine with others. What if I miss a dose? If you miss a dose, take it as soon as you can. If it is almost time for your next dose, take only that dose. Do not take double or extra doses. What may interact with this medicine? -alcohol -barbiturate medicines for sleep or treating seizures -MAOIs like Carbex, Eldepryl, Marplan, Nardil, and Parnate -medicines for allergies -medicines for depression, anxiety, or psychotic disturbances -medicines for sleep -some antibiotics This list may not describe all possible interactions. Give your health care provider a list of all the medicines, herbs, non-prescription drugs, or dietary supplements you use. Also tell them if you smoke, drink alcohol, or use illegal drugs. Some items may interact with your medicine. What should I watch for while using this medicine? Visit your doctor or health care professional for regular check ups. Tell your doctor if your symptoms do not improve or if they get worse. Your mouth may get dry. Chewing sugarless gum or sucking hard candy, and drinking plenty of water may help. Contact your doctor if the problem does not go away or is severe. This medicine may cause dry eyes and blurred vision. If you wear contact lenses you may feel some discomfort. Lubricating drops may help. See your eye doctor if the problem does not go away or is severe. You may get drowsy or dizzy. Do not drive, use machinery, or do anything that needs mental alertness until you know  how this medicine affects you. Do not stand or sit up quickly, especially if you are an older patient. This reduces the risk of dizzy or fainting spells. Alcohol may interfere with the effect of this medicine. Avoid alcoholic drinks. This medicine can make you more sensitive to the sun. Keep out of the sun. If you cannot avoid being in the sun, wear  protective clothing and use sunscreen. Do not use sun lamps or tanning beds/booths. What side effects may I notice from receiving this medicine? Side effects that you should report to your doctor or health care professional as soon as possible: -allergic reactions like skin rash, itching or hives, swelling of the face, lips, or tongue -breathing problems -changes in vision -confused, agitated, nervous -fast, irregular heartbeat -feeling faint, dizzy -seizures -tremor -trouble passing urine or change in the amount of urine -unusual sweating -unusually weak or tired Side effects that usually do not require medical attention (report to your doctor or health care professional if they continue or are bothersome): -constipation or diarrhea -drowsy -dry mouth, nose, throat -headache -loss of appetite -stomach upset, vomiting -trouble sleeping This list may not describe all possible side effects. Call your doctor for medical advice about side effects. You may report side effects to FDA at 1-800-FDA-1088. Where should I keep my medicine? Keep out of the reach of children. Store at room temperature between 15 and 30 degrees C (59 and 86 degrees F). Keep container tightly closed. Throw away any unused medicine after the expiration date. NOTE: This sheet is a summary. It may not cover all possible information. If you have questions about this medicine, talk to your doctor, pharmacist, or health care provider.    2016, [employer]. (2007-04-20 17:37:35)

## 2015-02-11 NOTE — Discharge Summary (Signed)
Name: Timothy Le MRN: [id number] DOB: 1960/05/20 55 y.o. PCP: Kerin Perna, NP  Date of Admission: 02/07/2015  3:09 PM Date of Discharge: 02/10/2015 Attending Physician: Dr Dareen Piano  Discharge Diagnosis: 1. COPD exacerbation and undiagnosed CHF Active Problems:   COPD exacerbation (HCC)   Acute on chronic congestive heart failure ([place])  Discharge Medications:   Medication List    STOP taking these medications        doxycycline 50 MG capsule  Commonly known as:  VIBRAMYCIN      TAKE these medications        albuterol 108 (90 Base) MCG/ACT inhaler  Commonly known as:  PROVENTIL HFA;VENTOLIN HFA  Inhale 2 puffs into the lungs every 4 (four) hours as needed for wheezing or shortness of breath.     amLODipine 10 MG tablet  Commonly known as:  NORVASC  Take 1 tablet (10 mg total) by mouth daily.     beclomethasone 40 MCG/ACT inhaler  Commonly known as:  QVAR  Inhale 2 puffs into the lungs daily.     chlorpheniramine 4 MG tablet  Commonly known as:  CHLORPHEN  Take 1 tablet (4 mg total) by mouth every 6 (six) hours as needed (congestion).     cyclobenzaprine 10 MG tablet  Commonly known as:  FLEXERIL  Take 10 mg by mouth 3 (three) times daily as needed for muscle spasms.     dextromethorphan-guaiFENesin 30-600 MG 12hr tablet  Commonly known as:  MUCINEX DM  Take 1 tablet by mouth 2 (two) times daily as needed for cough (and chest congestion).     fluticasone 50 MCG/ACT nasal spray  Commonly known as:  FLONASE  Place 2 sprays into both nostrils daily as needed for allergies.     furosemide 40 MG tablet  Commonly known as:  LASIX  Take 1 tablet (40 mg total) by mouth 2 (two) times daily.     gemfibrozil 600 MG tablet  Commonly known as:  LOPID  Take 600 mg by mouth 2 (two) times daily before a meal. Thirty minutes before morning and evening meal.     HYDROcodone-acetaminophen 5-325 MG tablet  Commonly known as:  NORCO/VICODIN  Take 1 tablet by  mouth every 6 (six) hours as needed for moderate pain.     levofloxacin 750 MG tablet  Commonly known as:  LEVAQUIN  Take 1 tablet (750 mg total) by mouth daily.     lisinopril-hydrochlorothiazide 20-25 MG tablet  Commonly known as:  PRINZIDE,ZESTORETIC  Take 1 tablet by mouth daily.     metFORMIN 500 MG tablet  Commonly known as:  GLUCOPHAGE  Take 1 tablet (500 mg total) by mouth 2 (two) times daily with a meal.     omeprazole 20 MG capsule  Commonly known as:  PRILOSEC  Take 20 mg by mouth daily as needed (for acid reflex).     predniSONE 20 MG tablet  Commonly known as:  DELTASONE  Take 2 tablets (40 mg total) by mouth daily with breakfast.     PRESCRIPTION MEDICATION  Take 1 tablet by mouth daily.     SIMPLY SALINE 0.9 % Aers  Generic drug:  Saline  Place 2 sprays into the nose daily as needed (for allergies).     Vitamin D3 1000 units Caps  Take 1 capsule by mouth daily.        Disposition and follow-up:   TimothyKimoni S Le was discharged from [facility] in Stable condition.  At the hospital follow up visit please address:  1.  CHF: may need cardiac mri and or repeat echo , pl recheck weight                   May benefit from heart failure outpatient follow up  COPD exacerbation: finished his antibiotics and prednisone? OSA: needs cpap adjustment HTN: pl- re-assess    2.  Labs / imaging needed at time of follow-up:   3.  Pending labs/ test needing follow-up:    Follow-up Appointments:     Follow-up Information    Follow up with EDWARDS, MICHELLE P, NP.   Specialty:  Internal Medicine   Contact information:   [place] [id number] [phone]       Discharge Instructions: Discharge Instructions    Diet - low sodium heart healthy    Complete by:  As directed            Consultations:    Procedures Performed:  Dg Chest 2 View  02/07/2015  CLINICAL DATA:  Shortness of breath, productive cough. EXAM:  CHEST  2 VIEW COMPARISON:  December 28, 2014. FINDINGS: Stable cardiomegaly. No pneumothorax or pleural effusion is noted. No acute pulmonary disease is noted. Bony thorax is unremarkable. IMPRESSION: No active cardiopulmonary disease. Electronically Signed   By: Marijo Conception, M.D.   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 He was discharged 5 weeks ago for similar symptoms, felt well for a couple of weeks, and then started having cough productive of yellowish sputum, chest congestion, and a sore throat, with subsequent worsening shortness of breath at rest but exacerbated by activity, wheezing, chills, worsening leg swelling. He says he has gained ~20 pounds over the last few months. He also endorses orthopnea and PND, as he has to sit up straight in a chair to sleep. He says he hasn't slept flat for 'at least a year'. He says he has run out of some of his medications and inhalers over the past few days. Finally, he also says he has had 1-2 episodes of blood when he wipes after having a normal, non-bloody bowel movement. He does not have any pain with defecation. He denies fevers, chest pain, palpitations, abdominal pain, nausea, vomiting, diarrhea, urinary symptoms, new rashes, numbness, or other symptoms at this time.   In the ED, he is afebrile, initially 87% on his 2L but up to low 90s on 3L. He did not initially improve with breathing treatments. He was also given furosemide x1 in ED.    Hospital Course by problem list: Active Problems:   COPD exacerbation ([place])   Acute on chronic congestive heart failure (HCC)   Severe COPD Exacerbation - wheezing, shortness of breath, increased sputum production likely precipitated by URI +/- medication noncompliance.  Recently hospitalized for similar symptoms. Gold 3D on home 2L, follows with Dr. Annamaria Boots outpatient. May also be worsened by apparent volume overload 2/2 likely CHF. He has no leukocytosis and fevers or other systemic symptoms. He received 2 doses of IV 40 mg solumedrol before transitioning to prednisone. He was on cefepime and kept on duonebs, albuterol, pulmicort and tessalon and robitussin. He was transitioned to levaquin  750 gm daily and sent home on 3  More days of levaquin. Prednisone stop date 1/25. He was sent home on mucinex and chlorpheniramine for congestion   Volume overload - likely 2/2 chf. also with PND,up ~20lbs since last admission. Discharge weight was 387, currently is 395. Has taken daily, not PRN furosemide 20mg  for leg swelling in the past given by his PCP . Likely 2/2 CHF. BNP not elevated. CXR difficult to interpret due to body habitus. TTE was attempted at last admission but images were nearly uninterpretable given body habitus, with recommendations to do TEE, cardiac MRI or cardiac CT to better assess LVF. He was on lasix 40 mg iv bid. Weight down to 389lbs from 400 on admission which is near is last discharge weaight of 387lbs, he is net negative 3L since admission. Pt's edema has decreased and breathing has improved.    Severe OSA - NPSG 7/15 shows severe OSA with AHI 46.7/hr. PFTs 9/15 also show moderate restriction c/w OHS. Likely mixed picture, along with baseline COPD and likely CHF. Wears CPAP at home. Per pt is is uncomfortable. Encouraged to do CPAP at night  HTN: BP has been stable. We Continued home amlodipine, lisinopril (takes lisinopril-HCTZ at home)  5. T2DM - last A1c 7.9 (12/16) . CBGs have been stable   I was not present on the day of discharge- hospital course taken from the progress notes.   Discharge Vitals:   BP 112/60 mmHg  Pulse 94  Temp(Src) 98.4 F (36.9 C) (Oral)  Resp 20  Ht 5\' 8"  (1.727 m)  Wt 389 lb 1.6 oz (176.495 kg)  BMI 59.18 kg/m2   SpO2 95%  Discharge Labs:  No results found for this or any previous visit (from the past 24 hour(s)).  Signed: Burgess Estelle, MD 02/11/2015, 2:24 PM    Services Ordered on Discharge:  Equipment Ordered on Discharge:

## 2015-03-11 ENCOUNTER — Encounter: Payer: Self-pay | Admitting: Internal Medicine

## 2015-03-11 ENCOUNTER — Ambulatory Visit (INDEPENDENT_AMBULATORY_CARE_PROVIDER_SITE_OTHER): Payer: Medicaid Other | Admitting: Internal Medicine

## 2015-03-11 VITALS — BP 130/70 | HR 109 | Ht 68.0 in | Wt >= 6400 oz

## 2015-03-11 DIAGNOSIS — G4733 Obstructive sleep apnea (adult) (pediatric): Secondary | ICD-10-CM | POA: Diagnosis not present

## 2015-03-11 DIAGNOSIS — J9621 Acute and chronic respiratory failure with hypoxia: Secondary | ICD-10-CM

## 2015-03-11 DIAGNOSIS — J449 Chronic obstructive pulmonary disease, unspecified: Secondary | ICD-10-CM

## 2015-03-11 NOTE — Assessment & Plan Note (Signed)
His weight seems totally out of his control. Offer bariatric referral

## 2015-03-11 NOTE — Progress Notes (Signed)
07/27/13- 41 yoM former smoker referred by  Juluis Mire, NP at [facility] at [facility]; COPD Patient reports concerns with his breathing in general, possible COPD, possible sleep apnea. He quit smoking over 5 years ago. Describes adult onset asthma with no history of pneumonia or heart disease and no history of blood clots in the family. He is treated for hypertension and diabetes. He describes cough most days, productive of thick white phlegm with no blood. He sometimes notices a little wheeze. Dyspnea with any sustained walking, especially briskly or on hills or stairs. He has taken prednisone  Intermittently with most recent taper ending one week ago. Using albuterol rescue inhaler and Qvar 40. He does not notice reflux or significant nasal congestion or drainage but admits he may have some seasonal nasal congestion especially spring and fall. Question of sleep apnea-loud snoring for years, daytime sleepiness. He has had a loud breathing noise in his throat for the past 2 years CT chest 05/15/13  IMPRESSION:  No evidence of pulmonary embolus.  Enlarged left thyroid lobe with presumed focal nodule/mass. This  could be further evaluated with thyroid ultrasound.  No acute findings in the chest.  Electronically Signed  By: [name] M.D.  On: 05/15/2013 16:52   09/21/13- 44 yoM former smoker followed for hx COPD, ? OSA, complicated by morbid obesity FOLLOWS FOR:  pt had PFT done today.  legs are aching and swelling--same issues since last OV NPSG 08/08/13- Severe OSA, AHI 46.7/ hr, CPAP to 20 cwp, Weight 385 lbs. PFT 09/21/13- Moderate restriction c/w OHS. FVC 2.32/ 57%, FEV1 1.91/ 59%, FEV1/FVC 0.82,  FEF25-75% 2.42/76%-signif response to BD only in small airways, TLC 69%, DLCO 68%  11/13/13- 31 yoM former smoker followed for hx COPD, ? OSA, complicated by morbid obesity FOLLOW FOR:  OSA; CPAP  wears every night approx 8 hours nightly; Pt complains of SOB, wheezing, coughing when he takes off  CPAP, mouth is dry; Pt has disability papers he wants to discuss- just an infomational letter. His PCP stopped lasix, apparently concerned over dried. Dry mouth is  Bothersome.  03/29/14- 53 yoM former smoker followed for hx COPD,  OSA, complicated by morbid obesity, OHS CPAP 20/ [facility]PS FOLLOWS FOR: pt was not able to have ONO done due to living matters-can do it now as he has his own house. Pt wears CPAP almost every night. Pt tosses and turns all night-stomach sleeper. DME is [facility], Walk test 03/29/14 desat to 87% room air, 2 laps x 185 feet Expects more short of breath in hot weather  06/04/14-  53 yoM former smoker followed for hx COPD,  OSA, complicated by morbid obesity, OHS CPAP 20/ [facility]  Follows For: Pt c/o SOB with weather change, wheezing, prod cough with thick white mucus. Uses CPAP 6-8 hours 4-5 nights a week.  He likes to use CPAP in the daytime sitting upright in the chair. At night he wants to sleep on his stomach and dislikes CPAP then.  09/06/14- 79 yoM former smoker followed for hx COPD,  OSA, complicated by morbid obesity, OHS CPAP 20/ [facility]PS FOLLOWS FOR: Wears CPAP20/ [facility]PS every night for more than 8 hours; not wearing any O2 during day or night. No recent DL from [facility]PS-will need to order and enroll in [place]. He got a recliner so he can sleep elevated with his CPAP. He is very pleased now, feeling much better, sleeping all night with CPAP. His been able to lose about 25 pounds and is beginning to  make an effort. He had gone for bariatric assessment before but they didn't have him scheduled-unclear where the problem was. ONOX 04/17/24 on CPAP with room air had shown sustained desaturation. Medicare would require an unattended sleep study with CPAP to confirm he is needing supplemental oxygen  03/11/2015-55 year old male former smoker followed for history COPD, OSA, complicated by morbid obesity, OHS CPAP 20/ APS FOLLOWS FOR:  pt. states he's unable to wear CPAP due to it taking his breath.  states he plans on starting to wear it again. c/o occ. SOB. prod. cough tan in color. wheezing. occ. chest pain/tightness. Started prednisone and Levaquin 02/10/2015. CPAP download shows inadequate use and inadequate control at 20 CWP but he only used a few days. He was discharged on prednisone 40 mg daily and recognizes that is causing fluid retention. Hospitalized in January "fluid retention". Continues oxygen 2 L continuously/APS. CXR 02/07/2015 IMPRESSION: No active cardiopulmonary disease. Electronically Signed  By: Marijo Conception, M.D.  On: 02/07/2015 16:25  ROS-see HPI Constitutional:   + weight loss, night sweats, fevers, chills, fatigue, lassitude. HEENT:   No-  headaches, difficulty swallowing, tooth/dental problems, sore throat,       No-  sneezing, itching, ear ache, nasal congestion, post nasal drip,  CV:  No-   chest pain, +orthopnea, PND, +swelling in lower extremities, no-anasarca, dizziness, palpitations Resp: +shortness of breath with exertion or at rest.              No-   productive cough,  + non-productive cough,  No- coughing up of blood.              No-   change in color of mucus.   wheezing.   Skin: No-   rash or lesions. GI:  No-   heartburn, indigestion, +abdominal pain,no- nausea, vomiting,  GU:  MS:  + joint pain or swelling.  . Neuro-     nothing unusual Psych:  No- change in mood or affect. No depression or anxiety.  No memory loss.  OBJ- Physical Exam General- Alert, Oriented, Affect-appropriate, Distress- none acute,                      + morbidly obese.               + So fat that he has to sprawl back in chair to unload abd from diaphragm so he can breathe. Skin- rash-none, lesions- none, excoriation- none Lymphadenopathy- none Head- atraumatic            Eyes- Gross vision intact, PERRLA, conjunctivae and secretions clear            Ears- Hearing, canals-normal            Nose- Clear, no-Septal dev, mucus, polyps, erosion, perforation              Throat- Mallampati IV , mucosa clear , drainage- none, tonsils- atrophic, + somewhat stridorous respiration                    + Shallow consistent with body habitus, normal voice quality Neck- +very thick , trachea midline,  Stridor+ , thyroid nl, carotid no bruit Chest - symmetrical excursion , unlabored           Heart/CV- RRR , no murmur , no gallop  , no rub, nl s1 s2                           -  JVD- none , edema+2-3, stasis changes- none, varices- none           Lung- clear to P&A, wheeze- none, cough- none , dullness-none, rub- none                            + Labored resps if he tries to sit forward and talk.            Chest wall-  Abd-  Br/ Gen/ Rectal- Not done, not indicated Extrem- cyanosis- none, clubbing, none, atrophy- none, strength- nl Neuro- grossly intact to observation

## 2015-03-11 NOTE — Patient Instructions (Addendum)
Check your pill bottles- Reduce prednisone to 1 pill daily x 3 days, then stop. It is causing too much water retention now.  Continue the oxygen at 2 L/M  I  Recommend you start back using CPAP at least when you are sleeping, if you can.

## 2015-03-11 NOTE — Assessment & Plan Note (Signed)
He remains oxygen dependent. Emphasis on weight loss.

## 2015-03-11 NOTE — Assessment & Plan Note (Signed)
Not compliant with CPAP saying that it takes his breath. We will need to shift to BiPAP if we can't get him comfortable again but first issue is weight loss.

## 2015-03-11 NOTE — Assessment & Plan Note (Signed)
I think his primary problems are obesity hypoventilation and secondary chronic hypoxic respiratory failure. I think he would be better off without the prednisone. Plan-reduce prednisone to 20 mg daily 3 days then stop

## 2015-03-13 ENCOUNTER — Telehealth: Payer: Self-pay | Admitting: Internal Medicine

## 2015-03-13 NOTE — Telephone Encounter (Signed)
Received phone call from Juluis Mire NP.  She said that she referred this patient to Dr. Annamaria Boots and she needs to talk to him in the morning when he gets into office.  She is requesting that Dr. Annamaria Boots call her on her Cell phone at [phone]

## 2015-03-14 ENCOUNTER — Telehealth: Payer: Self-pay | Admitting: Internal Medicine

## 2015-03-14 NOTE — Telephone Encounter (Signed)
She asked about trial of phentermine for weight loss- ok.  Much of his weight gain is water retention- need to focus on that with sufficient diuretic Consider exploring eligibility for Bariatric Program

## 2015-03-15 ENCOUNTER — Encounter: Payer: Self-pay | Admitting: Internal Medicine

## 2015-03-18 NOTE — Telephone Encounter (Signed)
Dr. Annamaria Boots, did you speak Juluis Mire, NP? Is it okay to close encounter? Please advise.

## 2015-03-18 NOTE — Telephone Encounter (Signed)
I did speak to Ms Timothy Le. She asked about trying a weight loss drug and I told her it would be ok.

## 2015-03-27 ENCOUNTER — Encounter (HOSPITAL_COMMUNITY): Payer: Self-pay | Admitting: Emergency Medicine

## 2015-03-27 ENCOUNTER — Emergency Department (HOSPITAL_COMMUNITY): Payer: Medicaid Other

## 2015-03-27 ENCOUNTER — Inpatient Hospital Stay (HOSPITAL_COMMUNITY)
Admission: EM | Admit: 2015-03-27 | Discharge: 2015-03-29 | DRG: 637 | Disposition: A | Discharge status: Home / Self Care | Payer: Medicaid Other | Attending: Family Medicine | Admitting: Family Medicine

## 2015-03-27 ENCOUNTER — Inpatient Hospital Stay (HOSPITAL_COMMUNITY): Payer: Medicaid Other

## 2015-03-27 DIAGNOSIS — E119 Type 2 diabetes mellitus without complications: Secondary | ICD-10-CM

## 2015-03-27 DIAGNOSIS — N179 Acute kidney failure, unspecified: Secondary | ICD-10-CM | POA: Diagnosis present

## 2015-03-27 DIAGNOSIS — E874 Mixed disorder of acid-base balance: Secondary | ICD-10-CM | POA: Diagnosis present

## 2015-03-27 DIAGNOSIS — Z8249 Family history of ischemic heart disease and other diseases of the circulatory system: Secondary | ICD-10-CM | POA: Diagnosis not present

## 2015-03-27 DIAGNOSIS — Z833 Family history of diabetes mellitus: Secondary | ICD-10-CM | POA: Diagnosis not present

## 2015-03-27 DIAGNOSIS — G4733 Obstructive sleep apnea (adult) (pediatric): Secondary | ICD-10-CM | POA: Diagnosis present

## 2015-03-27 DIAGNOSIS — R739 Hyperglycemia, unspecified: Secondary | ICD-10-CM | POA: Diagnosis present

## 2015-03-27 DIAGNOSIS — Z79891 Long term (current) use of opiate analgesic: Secondary | ICD-10-CM

## 2015-03-27 DIAGNOSIS — Z825 Family history of asthma and other chronic lower respiratory diseases: Secondary | ICD-10-CM

## 2015-03-27 DIAGNOSIS — E860 Dehydration: Secondary | ICD-10-CM | POA: Diagnosis present

## 2015-03-27 DIAGNOSIS — E871 Hypo-osmolality and hyponatremia: Secondary | ICD-10-CM | POA: Diagnosis present

## 2015-03-27 DIAGNOSIS — Z79899 Other long term (current) drug therapy: Secondary | ICD-10-CM | POA: Diagnosis not present

## 2015-03-27 DIAGNOSIS — I10 Essential (primary) hypertension: Secondary | ICD-10-CM | POA: Diagnosis present

## 2015-03-27 DIAGNOSIS — R197 Diarrhea, unspecified: Secondary | ICD-10-CM

## 2015-03-27 DIAGNOSIS — Z9981 Dependence on supplemental oxygen: Secondary | ICD-10-CM | POA: Diagnosis not present

## 2015-03-27 DIAGNOSIS — Z7984 Long term (current) use of oral hypoglycemic drugs: Secondary | ICD-10-CM

## 2015-03-27 DIAGNOSIS — Z6841 Body Mass Index (BMI) 40.0 and over, adult: Secondary | ICD-10-CM | POA: Diagnosis not present

## 2015-03-27 DIAGNOSIS — E1100 Type 2 diabetes mellitus with hyperosmolarity without nonketotic hyperglycemic-hyperosmolar coma (NKHHC): Principal | ICD-10-CM | POA: Diagnosis present

## 2015-03-27 DIAGNOSIS — J45909 Unspecified asthma, uncomplicated: Secondary | ICD-10-CM | POA: Diagnosis present

## 2015-03-27 DIAGNOSIS — Z7952 Long term (current) use of systemic steroids: Secondary | ICD-10-CM

## 2015-03-27 DIAGNOSIS — J9621 Acute and chronic respiratory failure with hypoxia: Secondary | ICD-10-CM | POA: Diagnosis present

## 2015-03-27 DIAGNOSIS — R0902 Hypoxemia: Secondary | ICD-10-CM

## 2015-03-27 DIAGNOSIS — Z87891 Personal history of nicotine dependence: Secondary | ICD-10-CM

## 2015-03-27 DIAGNOSIS — T383X5A Adverse effect of insulin and oral hypoglycemic [antidiabetic] drugs, initial encounter: Secondary | ICD-10-CM | POA: Diagnosis present

## 2015-03-27 DIAGNOSIS — Z9119 Patient's noncompliance with other medical treatment and regimen: Secondary | ICD-10-CM

## 2015-03-27 DIAGNOSIS — E1101 Type 2 diabetes mellitus with hyperosmolarity with coma: Secondary | ICD-10-CM | POA: Diagnosis not present

## 2015-03-27 DIAGNOSIS — E875 Hyperkalemia: Secondary | ICD-10-CM | POA: Diagnosis present

## 2015-03-27 DIAGNOSIS — J441 Chronic obstructive pulmonary disease with (acute) exacerbation: Secondary | ICD-10-CM | POA: Diagnosis present

## 2015-03-27 DIAGNOSIS — E1169 Type 2 diabetes mellitus with other specified complication: Secondary | ICD-10-CM | POA: Diagnosis not present

## 2015-03-27 DIAGNOSIS — R55 Syncope and collapse: Secondary | ICD-10-CM | POA: Diagnosis present

## 2015-03-27 DIAGNOSIS — Z794 Long term (current) use of insulin: Secondary | ICD-10-CM | POA: Diagnosis present

## 2015-03-27 LAB — COMPREHENSIVE METABOLIC PANEL
ALK PHOS: 96 U/L (ref 38–126)
ALT: 25 U/L (ref 17–63)
AST: 22 U/L (ref 15–41)
Albumin: 3.7 g/dL (ref 3.5–5.0)
Anion gap: 15 (ref 5–15)
BUN: 39 mg/dL — AB (ref 6–20)
CALCIUM: 9.7 mg/dL (ref 8.9–10.3)
CHLORIDE: 81 mmol/L — AB (ref 101–111)
CO2: 31 mmol/L (ref 22–32)
CREATININE: 2.19 mg/dL — AB (ref 0.61–1.24)
GFR calc Af Amer: 38 mL/min — ABNORMAL LOW (ref >60)
GFR calc non Af Amer: 33 mL/min — ABNORMAL LOW (ref >60)
GLUCOSE: 922 mg/dL — AB (ref 65–99)
Potassium: 5.2 mmol/L — ABNORMAL HIGH (ref 3.5–5.1)
SODIUM: 127 mmol/L — AB (ref 135–145)
Total Bilirubin: 0.8 mg/dL (ref 0.3–1.2)
Total Protein: 8 g/dL (ref 6.5–8.1)

## 2015-03-27 LAB — GLUCOSE, CAPILLARY
GLUCOSE-CAPILLARY: 293 mg/dL — AB (ref 65–99)
Glucose-Capillary: 385 mg/dL — ABNORMAL HIGH (ref 65–99)

## 2015-03-27 LAB — CBC WITH DIFFERENTIAL/PLATELET
BASOS PCT: 1 %
Basophils Absolute: 0.1 10*3/uL (ref 0.0–0.1)
EOS ABS: 0.2 10*3/uL (ref 0.0–0.7)
EOS PCT: 4 %
HCT: 44.8 % (ref 39.0–52.0)
HEMOGLOBIN: 14.2 g/dL (ref 13.0–17.0)
LYMPHS ABS: 1.9 10*3/uL (ref 0.7–4.0)
Lymphocytes Relative: 38 %
MCH: 29.5 pg (ref 26.0–34.0)
MCHC: 31.7 g/dL (ref 30.0–36.0)
MCV: 92.9 fL (ref 78.0–100.0)
MONO ABS: 0.6 10*3/uL (ref 0.1–1.0)
MONOS PCT: 11 %
Neutro Abs: 2.4 10*3/uL (ref 1.7–7.7)
Neutrophils Relative %: 46 %
Platelets: 229 10*3/uL (ref 150–400)
RBC: 4.82 MIL/uL (ref 4.22–5.81)
RDW: 17.1 % — AB (ref 11.5–15.5)
WBC: 5.2 10*3/uL (ref 4.0–10.5)

## 2015-03-27 LAB — URINE MICROSCOPIC-ADD ON: WBC UA: NONE SEEN WBC/hpf (ref 0–5)

## 2015-03-27 LAB — I-STAT VENOUS BLOOD GAS, ED
ACID-BASE EXCESS: 13 mmol/L — AB (ref 0.0–2.0)
Bicarbonate: 38.8 mEq/L — ABNORMAL HIGH (ref 20.0–24.0)
O2 SAT: 96 %
PO2 VEN: 73 mmHg — AB (ref 31.0–45.0)
TCO2: 40 mmol/L (ref 0–100)
pCO2, Ven: 49.7 mmHg (ref 45.0–50.0)
pH, Ven: 7.5 — ABNORMAL HIGH (ref 7.250–7.300)

## 2015-03-27 LAB — URINALYSIS, ROUTINE W REFLEX MICROSCOPIC
BILIRUBIN URINE: NEGATIVE
Glucose, UA: >1000 mg/dL — AB
Hgb urine dipstick: NEGATIVE
KETONES UR: NEGATIVE mg/dL
LEUKOCYTES UA: NEGATIVE
NITRITE: NEGATIVE
PH: 5.5 (ref 5.0–8.0)
PROTEIN: NEGATIVE mg/dL
Specific Gravity, Urine: 1.02 (ref 1.005–1.030)

## 2015-03-27 LAB — I-STAT CG4 LACTIC ACID, ED
Lactic Acid, Venous: 3.18 mmol/L (ref 0.5–2.0)
Lactic Acid, Venous: 2.33 mmol/L (ref 0.5–2.0)

## 2015-03-27 LAB — CBG MONITORING, ED
GLUCOSE-CAPILLARY: 598 mg/dL — AB (ref 65–99)
Glucose-Capillary: >600 mg/dL (ref 65–99)

## 2015-03-27 LAB — BRAIN NATRIURETIC PEPTIDE: B NATRIURETIC PEPTIDE 5: 9.8 pg/mL (ref 0.0–100.0)

## 2015-03-27 LAB — I-STAT TROPONIN, ED: Troponin i, poc: 0.01 ng/mL (ref 0.00–0.08)

## 2015-03-27 MED ORDER — IPRATROPIUM-ALBUTEROL 0.5-2.5 (3) MG/3ML IN SOLN: 3.0000 mL | Freq: Three times a day (TID) | RESPIRATORY_TRACT | Status: DC

## 2015-03-27 MED ORDER — DEXTROSE 50 % IV SOLN: 25.0000 mL | INTRAVENOUS | Status: DC | PRN

## 2015-03-27 MED ORDER — DEXTROSE-NACL 5-0.45 % IV SOLN: INTRAVENOUS | Status: DC

## 2015-03-27 MED ORDER — SODIUM CHLORIDE 0.9 % IV SOLN: INTRAVENOUS | Status: DC

## 2015-03-27 MED ORDER — IPRATROPIUM BROMIDE 0.02 % IN SOLN
0.5000 mg | Freq: Once | RESPIRATORY_TRACT | Status: AC
  Administered 2015-03-27: 0.5 mg via RESPIRATORY_TRACT
  Filled 2015-03-27: qty 2.5

## 2015-03-27 MED ORDER — GUAIFENESIN ER 600 MG PO TB12
600.0000 mg | ORAL_TABLET | Freq: Two times a day (BID) | ORAL | Status: DC
  Administered 2015-03-27 – 2015-03-29 (×4): 600 mg via ORAL
  Filled 2015-03-27 (×4): qty 1

## 2015-03-27 MED ORDER — INSULIN REGULAR BOLUS VIA INFUSION
0.0000 [IU] | Freq: Three times a day (TID) | INTRAVENOUS | Status: DC
  Filled 2015-03-27: qty 10

## 2015-03-27 MED ORDER — GEMFIBROZIL 600 MG PO TABS
600.0000 mg | ORAL_TABLET | Freq: Two times a day (BID) | ORAL | Status: DC
  Administered 2015-03-28 – 2015-03-29 (×3): 600 mg via ORAL
  Filled 2015-03-27 (×4): qty 1

## 2015-03-27 MED ORDER — PANTOPRAZOLE SODIUM 40 MG PO TBEC
40.0000 mg | DELAYED_RELEASE_TABLET | Freq: Every day | ORAL | Status: DC
  Administered 2015-03-27 – 2015-03-29 (×3): 40 mg via ORAL
  Filled 2015-03-27 (×3): qty 1

## 2015-03-27 MED ORDER — SODIUM CHLORIDE 0.9% FLUSH
3.0000 mL | Freq: Two times a day (BID) | INTRAVENOUS | Status: DC
  Administered 2015-03-28 – 2015-03-29 (×3): 3 mL via INTRAVENOUS

## 2015-03-27 MED ORDER — LEVOFLOXACIN IN D5W 750 MG/150ML IV SOLN: 750.0000 mg | INTRAVENOUS | Status: DC

## 2015-03-27 MED ORDER — BUDESONIDE 0.25 MG/2ML IN SUSP
0.2500 mg | Freq: Two times a day (BID) | RESPIRATORY_TRACT | Status: DC
  Administered 2015-03-28 – 2015-03-29 (×2): 0.25 mg via RESPIRATORY_TRACT
  Filled 2015-03-27 (×2): qty 2

## 2015-03-27 MED ORDER — FLUTICASONE PROPIONATE 50 MCG/ACT NA SUSP: 2.0000 | Freq: Every day | NASAL | Status: DC | PRN

## 2015-03-27 MED ORDER — SODIUM CHLORIDE 0.9 % IV SOLN
INTRAVENOUS | Status: DC
  Administered 2015-03-27: 5.4 [IU]/h via INTRAVENOUS
  Filled 2015-03-27: qty 2.5

## 2015-03-27 MED ORDER — AMLODIPINE BESYLATE 10 MG PO TABS
10.0000 mg | ORAL_TABLET | Freq: Every day | ORAL | Status: DC
  Administered 2015-03-27 – 2015-03-29 (×3): 10 mg via ORAL
  Filled 2015-03-27: qty 1
  Filled 2015-03-27: qty 2
  Filled 2015-03-27: qty 1

## 2015-03-27 MED ORDER — SODIUM CHLORIDE 0.9 % IV BOLUS (SEPSIS)
1000.0000 mL | Freq: Once | INTRAVENOUS | Status: AC
  Administered 2015-03-27: 1000 mL via INTRAVENOUS

## 2015-03-27 MED ORDER — LISINOPRIL-HYDROCHLOROTHIAZIDE 20-25 MG PO TABS: 1.0000 | ORAL_TABLET | Freq: Every day | ORAL | Status: DC

## 2015-03-27 MED ORDER — ALBUTEROL SULFATE (2.5 MG/3ML) 0.083% IN NEBU
5.0000 mg | INHALATION_SOLUTION | Freq: Once | RESPIRATORY_TRACT | Status: AC
  Administered 2015-03-27: 5 mg via RESPIRATORY_TRACT
  Filled 2015-03-27: qty 6

## 2015-03-27 MED ORDER — LEVOFLOXACIN IN D5W 750 MG/150ML IV SOLN
750.0000 mg | INTRAVENOUS | Status: DC
  Administered 2015-03-28: 750 mg via INTRAVENOUS
  Filled 2015-03-27: qty 150

## 2015-03-27 MED ORDER — ALBUTEROL SULFATE (2.5 MG/3ML) 0.083% IN NEBU
INHALATION_SOLUTION | RESPIRATORY_TRACT | Status: AC
  Filled 2015-03-27: qty 3

## 2015-03-27 MED ORDER — SODIUM CHLORIDE 0.9 % IV SOLN
INTRAVENOUS | Status: DC
  Administered 2015-03-27: 22:00:00 via INTRAVENOUS

## 2015-03-27 MED ORDER — HEPARIN SODIUM (PORCINE) 5000 UNIT/ML IJ SOLN
5000.0000 [IU] | Freq: Three times a day (TID) | INTRAMUSCULAR | Status: DC
  Administered 2015-03-27 – 2015-03-28 (×4): 5000 [IU] via SUBCUTANEOUS
  Filled 2015-03-27 (×5): qty 1

## 2015-03-27 MED ORDER — FUROSEMIDE 20 MG PO TABS: 40.0000 mg | ORAL_TABLET | Freq: Two times a day (BID) | ORAL | Status: DC

## 2015-03-27 MED ORDER — SODIUM CHLORIDE 0.9 % IV SOLN
INTRAVENOUS | Status: DC
  Filled 2015-03-27: qty 2.5

## 2015-03-27 MED ORDER — ONDANSETRON HCL 4 MG/2ML IJ SOLN: 4.0000 mg | Freq: Four times a day (QID) | INTRAMUSCULAR | Status: DC | PRN

## 2015-03-27 MED ORDER — ACETAMINOPHEN 325 MG PO TABS: 650.0000 mg | ORAL_TABLET | Freq: Four times a day (QID) | ORAL | Status: DC | PRN

## 2015-03-27 MED ORDER — ALBUTEROL SULFATE (2.5 MG/3ML) 0.083% IN NEBU: 2.5000 mg | INHALATION_SOLUTION | RESPIRATORY_TRACT | Status: DC | PRN

## 2015-03-27 MED ORDER — POLYETHYLENE GLYCOL 3350 17 G PO PACK: 17.0000 g | PACK | Freq: Every day | ORAL | Status: DC | PRN

## 2015-03-27 MED ORDER — ONDANSETRON HCL 4 MG PO TABS: 4.0000 mg | ORAL_TABLET | Freq: Four times a day (QID) | ORAL | Status: DC | PRN

## 2015-03-27 MED ORDER — ACETAMINOPHEN 650 MG RE SUPP: 650.0000 mg | Freq: Four times a day (QID) | RECTAL | Status: DC | PRN

## 2015-03-27 MED ORDER — IPRATROPIUM-ALBUTEROL 0.5-2.5 (3) MG/3ML IN SOLN: 3.0000 mL | Freq: Four times a day (QID) | RESPIRATORY_TRACT | Status: DC

## 2015-03-27 MED ORDER — IPRATROPIUM BROMIDE 0.02 % IN SOLN
RESPIRATORY_TRACT | Status: AC
  Filled 2015-03-27: qty 2.5

## 2015-03-27 MED ORDER — BECLOMETHASONE DIPROPIONATE 40 MCG/ACT IN AERS: 2.0000 | INHALATION_SPRAY | Freq: Every day | RESPIRATORY_TRACT | Status: DC

## 2015-03-27 MED ORDER — DEXTROSE-NACL 5-0.45 % IV SOLN
INTRAVENOUS | Status: DC
Start: 2015-03-27 — End: 2015-03-28
  Administered 2015-03-28 (×2): via INTRAVENOUS

## 2015-03-27 MED ORDER — INSULIN REGULAR HUMAN 100 UNIT/ML IJ SOLN
INTRAMUSCULAR | Status: DC
  Administered 2015-03-27: 7 [IU]/h via INTRAVENOUS
  Filled 2015-03-27: qty 2.5

## 2015-03-27 NOTE — ED Notes (Signed)
Dr Christy Gentles given a copy of lactic acid 3.18

## 2015-03-27 NOTE — Progress Notes (Signed)
Pharmacy Antibiotic Note  Timothy Le is a 55 y.o. male admitted on 03/27/2015 with COPD exacerbation.  Pharmacy has been consulted for renal dosing.  Obese- using normalized CrCl   Plan: The dose of Levaquin will be adjusted to 750mg  IV q48h based on renal function.  Height: 5\' 8"  (172.7 cm) Weight: (!) 381 lb 9.9 oz (173.1 kg) IBW/kg (Calculated) : 68.4  Temp (24hrs), Avg:98.6 F (37 C), Min:98.6 F (37 C), Max:98.6 F (37 C)   Recent Labs Lab 03/27/15 1621 03/27/15 1624 03/27/15 1948  WBC  --  5.2  --   CREATININE  --  2.19*  --   LATICACIDVEN 3.18*  --  2.33*    Estimated Creatinine Clearance: 60.9 mL/min (by C-G formula based on Cr of 2.19).   Normalized CrCl ~ 39.7 mL/min  No Known Allergies  Antimicrobials this admission: 3/8 Levaquin >>  Dose adjustments this admission: 750mg  IV q24h adjusted to q48h for normalized CrCl of 39 in obesity.   Microbiology results: 3/8 BCx:  3/8 UCx:  Thank you for allowing pharmacy to be a part of this patient's care.  Sloan Leiter, PharmD, BCPS Clinical Pharmacist [phone]  03/27/2015 10:06 PM

## 2015-03-27 NOTE — ED Notes (Signed)
Pt arrives from PCP via [facility] reporting increased SOB with hyperglycemia.  EMS reports pt had mucus plug in trachea yesterday, pt was suctioned. Pt reports feeling "dry", reports no appetite, only thirst.  Pt uses 2L at baseline.  Pt unable to speak in full sentences, cough noted.

## 2015-03-27 NOTE — ED Notes (Signed)
Dr Christy Gentles given a copy of lactic acid results 2.33

## 2015-03-27 NOTE — ED Notes (Signed)
MD at bedside. 

## 2015-03-27 NOTE — ED Provider Notes (Signed)
CSN: [id number]     Arrival date & time 03/27/15  1519 History   First MD Initiated Contact with Patient 03/27/15 1522     Chief Complaint  Patient presents with  . Hyperglycemia  . Shortness of Breath     (Consider location/radiation/quality/duration/timing/severity/associated sxs/prior Treatment) The history is provided by the patient and medical records. No language interpreter was used.     Timothy Le is a 55 y.o. male  with a hx of asthma, HTN, COPD, NIDDM, SOB on home O2 at 2lpm via [place] all the time  presents to the Emergency Department complaining of gradual, persistent, progressively worsening SOB, dehydration, cough onset 2-3 weeks ago.  Pt reports polyuria, polydipsia, cotton mouth and fatigue.  He reports compliance with meds, but does not check his CBG at home.  He reports no significant worsening of SOB, but pt arrives from PCP who reports that he is much more dyspenic than usual.  Pt reports he had an episode of choking on a mucous plug yesterday that required EMS to suction his throat.  He reports his cough is much worse than usual and the thick mucous production is abnormal.  Pt reports centralized chest pain that began during his coughing episode yesterday and has been persistent since that time, waxing and waning with coughing episodes.  Nothing makes it better and nothing makes it worse.  Pt denies fever, chills, headache, neck pain, abd pain, N/V/D, weakness, dizziness, syncope, dysuria.     PCP: Juluis Mire, NP at [facility]  Past Medical History  Diagnosis Date  . Asthma   . Hypertension   . Sleep apnea   . COPD (chronic obstructive pulmonary disease) ([place])   . Diabetes mellitus without complication ([place])   . Shortness of breath dyspnea    History reviewed. No pertinent past surgical history. Family History  Problem Relation Age of Onset  . Asthma Sister   . Asthma Other     nephew  . Hypertension Sister   . Diabetes type II Sister     Social History  Substance Use Topics  . Smoking status: Former Smoker -- 0.50 packs/day for .5 years    Types: Cigarettes    Start date: 07/28/1983    Quit date: 02/27/2013  . Smokeless tobacco: Never Used  . Alcohol Use: No     Comment: quit ETOH about 5 months ago-beer and liquor    Review of Systems  Constitutional: Positive for fatigue. Negative for fever, diaphoresis, appetite change and unexpected weight change.  HENT: Negative for mouth sores.   Eyes: Negative for visual disturbance.  Respiratory: Positive for cough, chest tightness and shortness of breath. Negative for wheezing.   Cardiovascular: Positive for chest pain.  Gastrointestinal: Negative for nausea, vomiting, abdominal pain, diarrhea and constipation.  Endocrine: Positive for polydipsia and polyuria. Negative for polyphagia.  Genitourinary: Negative for dysuria, urgency, frequency and hematuria.  Musculoskeletal: Negative for back pain and neck stiffness.  Skin: Negative for rash.  Allergic/Immunologic: Negative for immunocompromised state.  Neurological: Negative for syncope, light-headedness and headaches.  Hematological: Does not bruise/bleed easily.  Psychiatric/Behavioral: Negative for sleep disturbance. The patient is not nervous/anxious.       Allergies  Review of patient's allergies indicates no known allergies.  Home Medications   Prior to Admission medications   Medication Sig Start Date End Date Taking? Authorizing Provider  albuterol (PROVENTIL HFA;VENTOLIN HFA) 108 (90 BASE) MCG/ACT inhaler Inhale 2 puffs into the lungs every 4 (four) hours as  needed for wheezing or shortness of breath. 12/30/14  Yes Debbe Odea, MD  amLODipine (NORVASC) 10 MG tablet Take 1 tablet (10 mg total) by mouth daily. 12/30/14  Yes Debbe Odea, MD  beclomethasone (QVAR) 40 MCG/ACT inhaler Inhale 2 puffs into the lungs daily. 07/18/13  Yes Jennifer Piepenbrink, PA-C  Cholecalciferol 1000 units tablet Take 1,000  Units by mouth daily.   Yes Historical Provider, MD  dextromethorphan-guaiFENesin (MUCINEX DM) 30-600 MG 12hr tablet Take 1 tablet by mouth 2 (two) times daily as needed for cough (and chest congestion). 12/30/14  Yes Debbe Odea, MD  fluticasone (FLONASE) 50 MCG/ACT nasal spray Place 2 sprays into both nostrils daily as needed for allergies.    Yes Historical Provider, MD  furosemide (LASIX) 40 MG tablet Take 1 tablet (40 mg total) by mouth 2 (two) times daily. 02/10/15  Yes Norman Herrlich, MD  gemfibrozil (LOPID) 600 MG tablet Take 600 mg by mouth 2 (two) times daily before a meal. Thirty minutes before morning and evening meal.   Yes Historical Provider, MD  HYDROcodone-acetaminophen (NORCO/VICODIN) 5-325 MG tablet Take 1 tablet by mouth every 6 (six) hours as needed for moderate pain.   Yes Historical Provider, MD  lisinopril-hydrochlorothiazide (PRINZIDE,ZESTORETIC) 20-25 MG tablet Take 1 tablet by mouth daily.    Yes Historical Provider, MD  metFORMIN (GLUCOPHAGE) 500 MG tablet Take 1 tablet (500 mg total) by mouth 2 (two) times daily with a meal. 12/30/14  Yes Debbe Odea, MD  omeprazole (PRILOSEC) 20 MG capsule Take 20 mg by mouth daily as needed (for acid reflex).    Yes Historical Provider, MD  potassium chloride (K-DUR,KLOR-CON) 10 MEQ tablet Take 10 mEq by mouth 2 (two) times daily.   Yes Historical Provider, MD  senna-docusate (SENOKOT-S) 8.6-50 MG tablet Take 1 tablet by mouth daily as needed for moderate constipation.    Yes Historical Provider, MD  triamcinolone cream (KENALOG) 0.1 % Apply 1 application topically 2 (two) times daily as needed (for irritation).   Yes Historical Provider, MD  chlorpheniramine (CHLORPHEN) 4 MG tablet Take 1 tablet (4 mg total) by mouth every 6 (six) hours as needed (congestion). 02/10/15   Norman Herrlich, MD  levofloxacin (LEVAQUIN) 750 MG tablet Take 1 tablet (750 mg total) by mouth daily. 02/10/15   Norman Herrlich, MD  predniSONE (DELTASONE) 20 MG tablet  Take 2 tablets (40 mg total) by mouth daily with breakfast. 02/10/15   Norman Herrlich, MD   BP 96/68 mmHg  Pulse 89  Temp(Src) 98.6 F (37 C) (Oral)  Resp 24  Ht 5\' 8"  (1.727 m)  Wt 173.1 kg  BMI 58.04 kg/m2  SpO2 94% Physical Exam  Constitutional: He is oriented to person, place, and time. He appears well-developed and well-nourished. No distress.  obese  HENT:  Head: Normocephalic and atraumatic.  Right Ear: Tympanic membrane, external ear and ear canal normal.  Left Ear: Tympanic membrane, external ear and ear canal normal.  Nose: Nose normal. No epistaxis. Right sinus exhibits no maxillary sinus tenderness and no frontal sinus tenderness. Left sinus exhibits no maxillary sinus tenderness and no frontal sinus tenderness.  Mouth/Throat: Uvula is midline. Mucous membranes are not pale, dry and not cyanotic. No oropharyngeal exudate, posterior oropharyngeal edema, posterior oropharyngeal erythema or tonsillar abscesses.  Eyes: Conjunctivae are normal. Pupils are equal, round, and reactive to light.  Neck: Normal range of motion and full passive range of motion without pain.  Cardiovascular: Normal rate, normal heart sounds and intact  distal pulses.   Pulmonary/Chest: Effort normal and breath sounds normal. No stridor.  Significantly diminished breath sounds throughout without focal sounds; no audible rales Harsh, productive cough of thick yellow sputum. Hypoxia to 86% on 3LPM via [place] - increased to 4LMP  Abdominal: Soft. Bowel sounds are normal. There is no tenderness.  Musculoskeletal: Normal range of motion.  Lymphadenopathy:    He has no cervical adenopathy.  Neurological: He is alert and oriented to person, place, and time.  Skin: Skin is warm and dry. No rash noted. He is not diaphoretic.  Psychiatric: He has a normal mood and affect.  Nursing note and vitals reviewed.   ED Course  Procedures (including critical care time) Labs Review Labs Reviewed  COMPREHENSIVE METABOLIC  PANEL - Abnormal; Notable for the following:    Sodium 127 (*)    Potassium 5.2 (*)    Chloride 81 (*)    Glucose, Bld 922 (*)    BUN 39 (*)    Creatinine, Ser 2.19 (*)    GFR calc non Af Amer 33 (*)    GFR calc Af Amer 38 (*)    All other components within normal limits  CBC WITH DIFFERENTIAL/PLATELET - Abnormal; Notable for the following:    RDW 17.1 (*)    All other components within normal limits  I-STAT CG4 LACTIC ACID, ED - Abnormal; Notable for the following:    Lactic Acid, Venous 3.18 (*)    All other components within normal limits  I-STAT VENOUS BLOOD GAS, ED - Abnormal; Notable for the following:    pH, Ven 7.500 (*)    pO2, Ven 73.0 (*)    Bicarbonate 38.8 (*)    Acid-Base Excess 13.0 (*)    All other components within normal limits  CULTURE, BLOOD (ROUTINE X 2)  CULTURE, BLOOD (ROUTINE X 2)  URINE CULTURE  BRAIN NATRIURETIC PEPTIDE  URINALYSIS, ROUTINE W REFLEX MICROSCOPIC (NOT AT [facility])  BLOOD GAS, VENOUS  I-STAT TROPOININ, ED    Imaging Review Dg Chest 2 View  03/27/2015  CLINICAL DATA:  Increasing shortness of Breath EXAM: CHEST  2 VIEW COMPARISON:  02/07/2015 FINDINGS: Cardiac shadow remains enlarged. Mild atelectatic changes are noted in the left lung base. No focal confluent infiltrate is seen. The bony structures are within normal limits. IMPRESSION: Left basilar atelectasis. Electronically Signed   By: Inez Catalina M.D.   On: 03/27/2015 16:27   I have personally reviewed and evaluated these images and lab results as part of my medical decision-making.   EKG Interpretation   Date/Time:  Wednesday March 27 2015 15:37:52 EST Ventricular Rate:  90 PR Interval:  146 QRS Duration: 101 QT Interval:  373 QTC Calculation: 456 R Axis:   60 Text Interpretation:  Sinus rhythm Low voltage, precordial leads artifact  noted difficult to interpret due to artifact Confirmed by Christy Gentles  MD,  [place] ([id number]) on 03/27/2015 3:41:28 PM     CRITICAL CARE Performed by:  Abigail Butts Total critical care time: 45 minutes Critical care time was exclusive of separately billable procedures and treating other patients. Critical care was necessary to treat or prevent imminent or life-threatening deterioration. Critical care was time spent personally by me on the following activities: development of treatment plan with patient and/or surrogate as well as nursing, discussions with consultants, evaluation of patient's response to treatment, examination of patient, obtaining history from patient or surrogate, ordering and performing treatments and interventions, ordering and review of laboratory studies, ordering and review of radiographic  studies, pulse oximetry and re-evaluation of patient's condition.   MDM   Final diagnoses:  Dehydration  Hyponatremia  Hyperkalemia  AKI (acute kidney injury) ([place])  COPD exacerbation ([place])  Hypoxia  Morbid obesity due to excess calories ([place])  Hyperglycemia   [name] with fatigue, dehydration, thick sputum and cough. Patient also with recent shortness of breath, dyspnea on exertion and hypoxia at baseline oxygen usage. Labs show acute kidney injury with serum creatinine of 2.19 and glucose of 922. Also with mild hyponatremia and hyperkalemia. Lactic acid elevated at 3.18. Patient is afebrile. Chest x-ray without evidence of pneumonia.  Albuterol given with some improvement in breathing.  Lucas stabilizer begun. Patient will need admission for fluids, insulin and further evaluation.  The patient was discussed with and seen by Dr. Christy Gentles who agrees with the treatment plan.   Jarrett Soho Binh Doten, PA-C 03/27/15 1808  Ripley Fraise, MD 03/27/15 2042

## 2015-03-27 NOTE — Progress Notes (Signed)
Patient refusing CPAP for tonight. 

## 2015-03-27 NOTE — ED Notes (Signed)
CBG >600 °

## 2015-03-27 NOTE — ED Provider Notes (Signed)
Patient seen/examined in the Emergency Department in conjunction with Midlevel Provider [place] Patient presents with cough/sob and hyperglycemia Exam : awake/alert, coughing frequently, lung exam limited due to body habitus Plan: imaging/labs pending at this time   EKG Interpretation  Date/Time:  Wednesday March 27 2015 15:46:46 EST Ventricular Rate:  87 PR Interval:  152 QRS Duration: 92 QT Interval:  375 QTC Calculation: 451 R Axis:   64 Text Interpretation:  Sinus rhythm Low voltage, precordial leads ST elev, probable normal early repol pattern No significant change since january 2017 Confirmed by Christy Gentles  MD, [place] Test ([id number]) on 03/27/2015 3:53:32 PM         Ripley Fraise, MD 03/27/15 [phone]

## 2015-03-27 NOTE — H&P (Signed)
[facility] History and Physical  Timothy Le [id number] DOB: Jul 21, 1960 DOA: 03/27/2015  Referring physician:  PCP: Kerin Perna, NP   Chief Complaint: "I almost passed out."  HPI: Timothy Le is a 55 y.o. male    Pt states sx started about 1.5wks ago with incr sob. 3 days ago he began to spit up mucus, yellow and thick. Diff getting it up. Pt was also coughing. Pt also had diaphoresis. Pt got breathing tx from EMS and stayed home. Pt also almost fainted while running erransds. Due to issues & a foul taste uin his mouth pt went to see his MD. When he was seen his MD sent him to the ED for eval due to dehydration and fluctuations in his BP. Pt states he had a similar event recently.  Pos - Polydypsia and polyuria.  Last in hosp about a months ago for SOB.  Taking all meds. No med chnages. No sick contacts.  Dr. Annamaria Boots - Pulmonologist  Review of Systems:  Constitutional: No  Fevers, chills; pos fatigue.  HEENT:  No headaches, Difficulty swallowing Cardio-vascular:  No chest pain, anasarca, dizziness GI:  No abdominal pain, nausea, vomiting, diarrhea, change in bowel habits Resp:  See HPI Skin:  no rash or lesions.  GU:  no dysuria, change in color of urine, no urgency or frequency. No flank pain.  Musculoskeletal:  No joint pain or swelling. No decreased range of motion. No back pain.  Psych:  No change in mood or affect. No depression or anxiety.  Neuro:  No change in sensation, unilateral strength, or cognitive abilities  All other systems were reviewed and are negative.  Past Medical History  Diagnosis Date  . Asthma   . Hypertension   . Sleep apnea   . COPD (chronic obstructive pulmonary disease) ([place])   . Diabetes mellitus without complication ([place])   . Shortness of breath dyspnea    History reviewed. No pertinent past surgical history. Social History:  reports that he quit smoking about 2 years ago. His smoking use included  Cigarettes. He started smoking about 31 years ago. He has a .25 pack-year smoking history. He has never used smokeless tobacco. He reports that he does not drink alcohol or use illicit drugs.  No Known Allergies  Family History  Problem Relation Age of Onset  . Asthma Sister   . Asthma Other     nephew  . Hypertension Sister   . Diabetes type II Sister      Prior to Admission medications   Medication Sig Start Date End Date Taking? Authorizing Provider  albuterol (PROVENTIL HFA;VENTOLIN HFA) 108 (90 BASE) MCG/ACT inhaler Inhale 2 puffs into the lungs every 4 (four) hours as needed for wheezing or shortness of breath. 12/30/14  Yes Debbe Odea, MD  amLODipine (NORVASC) 10 MG tablet Take 1 tablet (10 mg total) by mouth daily. 12/30/14  Yes Debbe Odea, MD  beclomethasone (QVAR) 40 MCG/ACT inhaler Inhale 2 puffs into the lungs daily. 07/18/13  Yes Jennifer Piepenbrink, PA-C  Cholecalciferol 1000 units tablet Take 1,000 Units by mouth daily.   Yes Historical Provider, MD  dextromethorphan-guaiFENesin (MUCINEX DM) 30-600 MG 12hr tablet Take 1 tablet by mouth 2 (two) times daily as needed for cough (and chest congestion). 12/30/14  Yes Debbe Odea, MD  fluticasone (FLONASE) 50 MCG/ACT nasal spray Place 2 sprays into both nostrils daily as needed for allergies.    Yes Historical Provider, MD  furosemide (LASIX) 40 MG tablet Take 1  tablet (40 mg total) by mouth 2 (two) times daily. 02/10/15  Yes Norman Herrlich, MD  gemfibrozil (LOPID) 600 MG tablet Take 600 mg by mouth 2 (two) times daily before a meal. Thirty minutes before morning and evening meal.   Yes Historical Provider, MD  HYDROcodone-acetaminophen (NORCO/VICODIN) 5-325 MG tablet Take 1 tablet by mouth every 6 (six) hours as needed for moderate pain.   Yes Historical Provider, MD  lisinopril-hydrochlorothiazide (PRINZIDE,ZESTORETIC) 20-25 MG tablet Take 1 tablet by mouth daily.    Yes Historical Provider, MD  metFORMIN (GLUCOPHAGE) 500 MG  tablet Take 1 tablet (500 mg total) by mouth 2 (two) times daily with a meal. 12/30/14  Yes Debbe Odea, MD  omeprazole (PRILOSEC) 20 MG capsule Take 20 mg by mouth daily as needed (for acid reflex).    Yes Historical Provider, MD  potassium chloride (K-DUR,KLOR-CON) 10 MEQ tablet Take 10 mEq by mouth 2 (two) times daily.   Yes Historical Provider, MD  senna-docusate (SENOKOT-S) 8.6-50 MG tablet Take 1 tablet by mouth daily as needed for moderate constipation.    Yes Historical Provider, MD  triamcinolone cream (KENALOG) 0.1 % Apply 1 application topically 2 (two) times daily as needed (for irritation).   Yes Historical Provider, MD  chlorpheniramine (CHLORPHEN) 4 MG tablet Take 1 tablet (4 mg total) by mouth every 6 (six) hours as needed (congestion). 02/10/15   Norman Herrlich, MD  levofloxacin (LEVAQUIN) 750 MG tablet Take 1 tablet (750 mg total) by mouth daily. 02/10/15   Norman Herrlich, MD  predniSONE (DELTASONE) 20 MG tablet Take 2 tablets (40 mg total) by mouth daily with breakfast. 02/10/15   Norman Herrlich, MD   Physical Exam: Filed Vitals:   03/27/15 1645 03/27/15 1700 03/27/15 1715 03/27/15 1745  BP: 113/92 98/52 103/68 96/68  Pulse: 93 79 87 89  Temp:      TempSrc:      Resp: 23 21 23 24   Height:      Weight:      SpO2: 87% 93% 92% 94%    Wt Readings from Last 3 Encounters:  03/27/15 173.1 kg (381 lb 9.9 oz)  03/11/15 181.575 kg (400 lb 4.8 oz)  02/10/15 176.495 kg (389 lb 1.6 oz)    General:  Appears anxious and in mod resp distress Eyes: PERRL, EOMI, normal lids, iris ENT: grossly normal hearing, lips & tongue; dry mucosa Neck: no LAD, masses or thyromegaly Cardiovascular: RRR, no m/r/g. No LE edema. 1+ radial/DP/PT bil Respiratory: incr wob, diffuse wheeze, no crackles or rhonchi Abdomen: soft, ntnd Skin: no rash or induration seen on limited exam; long toe nails Musculoskeletal: grossly normal tone BUE/BLE Psychiatric: grossly normal mood and affect or situation,  speech fluent and appropriate Neurologic: CN 2-12 grossly intact, moves all extremities in coordinated fashion.          Labs on Admission:  Basic Metabolic Panel:  Recent Labs Lab 03/27/15 1624  NA 127*  K 5.2*  CL 81*  CO2 31  GLUCOSE 922*  BUN 39*  CREATININE 2.19*  CALCIUM 9.7   Liver Function Tests:  Recent Labs Lab 03/27/15 1624  AST 22  ALT 25  ALKPHOS 96  BILITOT 0.8  PROT 8.0  ALBUMIN 3.7   No results for input(s): LIPASE, AMYLASE in the last 168 hours. No results for input(s): AMMONIA in the last 168 hours. CBC:  Recent Labs Lab 03/27/15 1624  WBC 5.2  NEUTROABS 2.4  HGB 14.2  HCT 44.8  MCV 92.9  PLT 229   Cardiac Enzymes: No results for input(s): CKTOTAL, CKMB, CKMBINDEX, TROPONINI in the last 168 hours.  BNP (last 3 results)  Recent Labs  12/27/14 1659 02/07/15 1540 03/27/15 1624  BNP 27.9 26.9 9.8    ProBNP (last 3 results) No results for input(s): PROBNP in the last 8760 hours.   CREATININE: 2.19 mg/dL ABNORMAL (03/27/15 1624) Estimated creatinine clearance - 60.9 mL/min  CBG: No results for input(s): GLUCAP in the last 168 hours.  Radiological Exams on Admission: Dg Chest 2 View  03/27/2015  CLINICAL DATA:  Increasing shortness of Breath EXAM: CHEST  2 VIEW COMPARISON:  02/07/2015 FINDINGS: Cardiac shadow remains enlarged. Mild atelectatic changes are noted in the left lung base. No focal confluent infiltrate is seen. The bony structures are within normal limits. IMPRESSION: Left basilar atelectasis. Electronically Signed   By: Inez Catalina M.D.   On: 03/27/2015 16:27    EKG: Independently reviewed. No STEMI. Similar ST abn in precordial leads on 01/2015 EKGs  Assessment/Plan Principal Problem:   Hyperglycemia Active Problems:   Obstructive sleep apnea   COPD exacerbation ([facility])   Acute on chronic respiratory failure with hypoxemia (H[facility])   DM (diabetes mellitus) ([facility])   HTN (hypertension)  Hyperglycemia - not in  DKA - placed on insulin drip in ED - bolus 2L NS, MIVF based on BS - pseudohyponatremia on lab work - LA from 3.18 to 2.33, likely due to dehydration, CO2 lower than pt's baseline, blood gas alkalotic  AKI - baseline cr about 1.2, 2.19 on admit - 2/2 dehdyration from hyperglycemia - mild elevated K+, will likely go down with fluids and insulin - pharm consult  DM - hold metformin - start SSI once of insulin drip  COPD exacerbation/Acute on Chronic Resp Failure - pt may have a pna hiding on CXR due to dehydration may need repeat img if still inpt and no progress - BNP 9.8 - holding steroids until Bs better controlled - levaquin IV ordered - sch duonebs - prn albuterol - mucinex BID - sputum cult ordered - cont home pulmicort  Sleep Apnea - CPAP per RT  HTN - cont home amlodipine  Code Status: Full Code DVT Prophylaxis: heparin Family Communication: wife Disposition Plan: Pending Improvement    Elwin Mocha, MD Family Medicine [facility] [email or web address] Password [email or web address]

## 2015-03-27 NOTE — Progress Notes (Signed)
Admission note:  Arrival Method: Pt arrived on stretcher from ED Mental Orientation: Alert and oriented x 4 Telemetry: Telemetry box 6e06 applied. CCMT notified.  Assessment: Completed Skin: Boil noted at the top of patient's buttocks. Pink foam dressing placed.  IV: Right hand IV, insulin drip infusing.  Pain: Pt states no pain at this time Tubes: N/A Safety Measures: Bed in lowest position, non-slip socks placed, call light within reach, bed alarm activated Fall Prevention Safety Plan: Reviewed with patient. Patient is a high fall risk Admission Screening: Completed 6700 Orientation: Patient has been oriented to the unit, staff and to the room. Orders have been reviewed and implemented. Call light is within reach. Will continue to monitor the patient closely.   Shelbie Hutching, RN, BSN

## 2015-03-28 DIAGNOSIS — E1169 Type 2 diabetes mellitus with other specified complication: Secondary | ICD-10-CM

## 2015-03-28 DIAGNOSIS — I10 Essential (primary) hypertension: Secondary | ICD-10-CM | POA: Diagnosis present

## 2015-03-28 DIAGNOSIS — J441 Chronic obstructive pulmonary disease with (acute) exacerbation: Secondary | ICD-10-CM

## 2015-03-28 DIAGNOSIS — R55 Syncope and collapse: Secondary | ICD-10-CM | POA: Diagnosis present

## 2015-03-28 DIAGNOSIS — E1100 Type 2 diabetes mellitus with hyperosmolarity without nonketotic hyperglycemic-hyperosmolar coma (NKHHC): Secondary | ICD-10-CM | POA: Diagnosis present

## 2015-03-28 DIAGNOSIS — J9621 Acute and chronic respiratory failure with hypoxia: Secondary | ICD-10-CM

## 2015-03-28 DIAGNOSIS — E119 Type 2 diabetes mellitus without complications: Secondary | ICD-10-CM

## 2015-03-28 LAB — BASIC METABOLIC PANEL
ANION GAP: 10 (ref 5–15)
BUN: 35 mg/dL — ABNORMAL HIGH (ref 6–20)
CALCIUM: 9.3 mg/dL (ref 8.9–10.3)
CO2: 35 mmol/L — ABNORMAL HIGH (ref 22–32)
Chloride: 94 mmol/L — ABNORMAL LOW (ref 101–111)
Creatinine, Ser: 1.97 mg/dL — ABNORMAL HIGH (ref 0.61–1.24)
GFR calc Af Amer: 43 mL/min — ABNORMAL LOW (ref >60)
GFR, EST NON AFRICAN AMERICAN: 37 mL/min — AB (ref >60)
Glucose, Bld: 186 mg/dL — ABNORMAL HIGH (ref 65–99)
POTASSIUM: 3.7 mmol/L (ref 3.5–5.1)
SODIUM: 139 mmol/L (ref 135–145)

## 2015-03-28 LAB — PHOSPHORUS: Phosphorus: 3.1 mg/dL (ref 2.5–4.6)

## 2015-03-28 LAB — MAGNESIUM: Magnesium: 2.4 mg/dL (ref 1.7–2.4)

## 2015-03-28 LAB — GLUCOSE, CAPILLARY
GLUCOSE-CAPILLARY: 300 mg/dL — AB (ref 65–99)
GLUCOSE-CAPILLARY: 298 mg/dL — AB (ref 65–99)
GLUCOSE-CAPILLARY: 241 mg/dL — AB (ref 65–99)
GLUCOSE-CAPILLARY: 185 mg/dL — AB (ref 65–99)
GLUCOSE-CAPILLARY: 174 mg/dL — AB (ref 65–99)
GLUCOSE-CAPILLARY: 170 mg/dL — AB (ref 65–99)
GLUCOSE-CAPILLARY: 181 mg/dL — AB (ref 65–99)
Glucose-Capillary: 182 mg/dL — ABNORMAL HIGH (ref 65–99)
Glucose-Capillary: 220 mg/dL — ABNORMAL HIGH (ref 65–99)
Glucose-Capillary: 229 mg/dL — ABNORMAL HIGH (ref 65–99)
Glucose-Capillary: 238 mg/dL — ABNORMAL HIGH (ref 65–99)
Glucose-Capillary: 256 mg/dL — ABNORMAL HIGH (ref 65–99)
Glucose-Capillary: 257 mg/dL — ABNORMAL HIGH (ref 65–99)

## 2015-03-28 LAB — CBC
HEMATOCRIT: 42.1 % (ref 39.0–52.0)
HEMOGLOBIN: 13.2 g/dL (ref 13.0–17.0)
MCH: 29.3 pg (ref 26.0–34.0)
MCHC: 31.4 g/dL (ref 30.0–36.0)
MCV: 93.6 fL (ref 78.0–100.0)
Platelets: 221 10*3/uL (ref 150–400)
RBC: 4.5 MIL/uL (ref 4.22–5.81)
RDW: 17 % — AB (ref 11.5–15.5)
WBC: 5.5 10*3/uL (ref 4.0–10.5)

## 2015-03-28 LAB — TROPONIN I

## 2015-03-28 MED ORDER — IPRATROPIUM-ALBUTEROL 0.5-2.5 (3) MG/3ML IN SOLN
3.0000 mL | RESPIRATORY_TRACT | Status: DC
  Administered 2015-03-28 – 2015-03-29 (×4): 3 mL via RESPIRATORY_TRACT
  Filled 2015-03-28 (×5): qty 3

## 2015-03-28 MED ORDER — INSULIN STARTER KIT- PEN NEEDLES (ENGLISH)
1.0000 | Freq: Once | Status: AC
  Administered 2015-03-28: 1
  Filled 2015-03-28: qty 1

## 2015-03-28 MED ORDER — INSULIN ASPART 100 UNIT/ML ~~LOC~~ SOLN
0.0000 [IU] | Freq: Every day | SUBCUTANEOUS | Status: DC
  Administered 2015-03-28: 3 [IU] via SUBCUTANEOUS

## 2015-03-28 MED ORDER — INSULIN GLARGINE 100 UNIT/ML ~~LOC~~ SOLN
50.0000 [IU] | Freq: Every day | SUBCUTANEOUS | Status: DC
  Administered 2015-03-29: 50 [IU] via SUBCUTANEOUS
  Filled 2015-03-28: qty 0.5

## 2015-03-28 MED ORDER — INSULIN GLARGINE 100 UNIT/ML ~~LOC~~ SOLN
20.0000 [IU] | Freq: Every day | SUBCUTANEOUS | Status: DC
  Filled 2015-03-28: qty 0.2

## 2015-03-28 MED ORDER — SODIUM CHLORIDE 0.9 % IV SOLN
INTRAVENOUS | Status: DC
  Administered 2015-03-28: 11:00:00 via INTRAVENOUS

## 2015-03-28 MED ORDER — LIVING WELL WITH DIABETES BOOK
Freq: Once | Status: AC
  Administered 2015-03-28: 14:00:00
  Filled 2015-03-28: qty 1

## 2015-03-28 MED ORDER — INSULIN GLARGINE 100 UNIT/ML ~~LOC~~ SOLN
10.0000 [IU] | Freq: Every day | SUBCUTANEOUS | Status: DC
  Administered 2015-03-28: 10 [IU] via SUBCUTANEOUS
  Filled 2015-03-28: qty 0.1

## 2015-03-28 MED ORDER — INSULIN ASPART 100 UNIT/ML ~~LOC~~ SOLN: 0.0000 [IU] | Freq: Every day | SUBCUTANEOUS | Status: DC

## 2015-03-28 MED ORDER — INSULIN GLARGINE 100 UNIT/ML ~~LOC~~ SOLN
40.0000 [IU] | SUBCUTANEOUS | Status: AC
  Administered 2015-03-28: 40 [IU] via SUBCUTANEOUS
  Filled 2015-03-28: qty 0.4

## 2015-03-28 MED ORDER — INSULIN ASPART 100 UNIT/ML ~~LOC~~ SOLN
0.0000 [IU] | Freq: Three times a day (TID) | SUBCUTANEOUS | Status: DC
  Administered 2015-03-28: 3 [IU] via SUBCUTANEOUS

## 2015-03-28 MED ORDER — INSULIN ASPART 100 UNIT/ML ~~LOC~~ SOLN
6.0000 [IU] | Freq: Three times a day (TID) | SUBCUTANEOUS | Status: DC
  Administered 2015-03-28 – 2015-03-29 (×3): 6 [IU] via SUBCUTANEOUS

## 2015-03-28 MED ORDER — DOXYCYCLINE HYCLATE 100 MG PO TABS
100.0000 mg | ORAL_TABLET | Freq: Two times a day (BID) | ORAL | Status: DC
  Administered 2015-03-28 – 2015-03-29 (×3): 100 mg via ORAL
  Filled 2015-03-28 (×3): qty 1

## 2015-03-28 MED ORDER — INSULIN ASPART 100 UNIT/ML ~~LOC~~ SOLN
0.0000 [IU] | Freq: Three times a day (TID) | SUBCUTANEOUS | Status: DC
  Administered 2015-03-28: 7 [IU] via SUBCUTANEOUS
  Administered 2015-03-29: 11 [IU] via SUBCUTANEOUS
  Administered 2015-03-29: 15 [IU] via SUBCUTANEOUS

## 2015-03-28 NOTE — Progress Notes (Signed)
03/28/15  1113  Paged Dr Tana Coast to see if she wanted to adjust patient diet. Waiting for MD response.

## 2015-03-28 NOTE — Progress Notes (Addendum)
Inpatient Diabetes Program Recommendations  AACE/ADA: New Consensus Statement on Inpatient Glycemic Control (2015)  Target Ranges:  Prepandial:   less than 140 mg/dL      Peak postprandial:   less than 180 mg/dL (1-2 hours)      Critically ill patients:  140 - 180 mg/dL   Spoke with patient about diabetes and home regimen for diabetes control. Patient reports that he is followed by PCP for diabetes management and was referred to the hospital yesterday by her for dehydration. Currently he takes only Metformin 500 BID and reports taking it as prescribed. Patient states that he does not check his glucose at home and will need a glucose meter at time of discharge.  Inquired about prior A1C and patient reports that he does not recall his last A1C value.  Discussed glucose and A1C goals.  Discussed importance of checking CBGs and maintaining good CBG control to prevent long-term and short-term complications.  Discussed impact of nutrition, exercise, stress, sickness, and medications on diabetes control. Patient states that he eats a lot of junk food, fruit,  and drinks a lot of regular drinks and gatorade. Discussed the need to look at the total carbohydrates on a food label, along with portion sizes. Encouraged patient to check his glucose 2-3 times per day and to keep a log book of glucose readings and insulin taken which he will need to take to doctor appointments. Explained how the doctor he follows up with can use the log book to continue to make insulin adjustments if needed.  Patient verbalizes willingness to be placed on basal insulin 1-2 injections a day if needed for glucose control. Patient verbalized understanding of information discussed and he states that he has no further questions at this time related to diabetes.   Have ordered the Living Well with Diabetes book, patient will view DM videos, ordered insulin pen starter kit (which is covered by medicaid), RN to assist patient in injecting  himself with insulin and checking his glucose while inpatient, Ordered an Ambulatory referral to Nutrition and DM management for 1 on 1 counseling of diet (discussed with patient and is wanting to seek diet counseling).  Thanks, Tama Headings RN, MSN, [facility] Inpatient Diabetes Coordinator Team Pager [phone] (8a-5p)

## 2015-03-28 NOTE — Progress Notes (Addendum)
03/28/15 Called respiratory regarding scheduled Neb treatment. Spoke with Clarise Cruz (respiratory) to make sure patient is on their list for treatments.

## 2015-03-28 NOTE — Progress Notes (Addendum)
[facility]                                                                              Patient Demographics  Timothy Le, is a 55 y.o. male, DOB - May 02, 1960, JIZ:[id number]  Admit date - 03/27/2015   Admitting Physician Annita Brod, MD  Outpatient Primary MD for the patient is Le, Timothy Cage, NP  LOS - 1   Chief Complaint  Patient presents with  . Hyperglycemia  . Shortness of Breath       Brief HPI   Patient is a 55 year old male with uncontrolled diabetes mellitus, hypertension, COPD, chronic respiratory failure on home oxygen 2 L all the time presented with progressively worsening shortness of breath, polydipsia, polyuria, dehydration. Patient also endorsed having cough, fatigue for last 2-3 weeks. Patient reports that he does not have glucometer, does not check his CBGs at home. In ED, patient was found to have glucose 922, sodium 127, potassium 5.2, bicarbonate 31 BUN 39, creatinine 2.19, lactic acidosis 3.18. Patient was admitted for COPD exacerbation and HONK hyperglycemia   Assessment & Plan    Principal Problem:   Type 2 diabetes mellitus with hyperosmolar nonketotic hyperglycemia ([place]), Noncompliance - At the time of my evaluation, patient noted to be on D5 drip at 125 mL an hour, eating a heart healthy diet and on insulin drip 11. Last 4 CBGs have been below 200. Changed diet to carb modified, discontinue D5 drip, start subcutaneous insulin Lantus 50 units with sliding scale and after 1 hour discontinue insulin drip. Explained in detail to the patient's nurse.  - Discontinue the D5 drip. - Follow hemoglobin A1c  Active Problems: Acute kidney injury with dehydration - Placed on normal saline at 75 mL an hour, creatinine improving  Lactic acidosis: Likely due to dehydration also possibly from metformin - Continue gentle hydration, lactic acid improving    COPD exacerbation (HCC), acute on chronic respiratory failure with hypoxia -  Currently improving, placed on scheduled nebs, no need of steroids - Placed on oral doxycycline    HTN (hypertension) - Currently stable, continue amlodipine  Morbid obesity BMI 58 -Counseled strongly on diet and weight control, exercise     Near syncope, Dizziness : Likely due to dehydration, #1, lactic acidosis acidosis -  PT evaluation, troponin 2 negative   Code Status: Full   Family Communication: Discussed in detail with the patient, all imaging results, lab results explained to the patient   Disposition Plan: Hopefully tomorrow if improved   Time Spent in minutes  25 minutes  Procedures    Consults   None   DVT Prophylaxis  heparin  Medications  Scheduled Meds: . amLODipine  10 mg Oral Daily  . budesonide (PULMICORT) nebulizer solution  0.25 mg Nebulization BID  . gemfibrozil  600 mg Oral BID AC  . guaiFENesin  600 mg Oral BID  . heparin  5,000 Units Subcutaneous 3 times per day  . insulin aspart  0-15 Units Subcutaneous TID WC  . insulin aspart  0-5 Units Subcutaneous QHS  . insulin glargine  10 Units Subcutaneous Daily  . insulin starter kit- pen needles  1 kit Other Once  . ipratropium-albuterol  3 mL Nebulization Q4H  . levofloxacin (LEVAQUIN) IV  750 mg Intravenous Q48H  . living well with diabetes book   Does not apply Once  . pantoprazole  40 mg Oral Daily  . sodium chloride flush  3 mL Intravenous Q12H   Continuous Infusions: . sodium chloride    . insulin (NOVOLIN-R) infusion 11.4 Units/hr (03/28/15 0812)   PRN Meds:.acetaminophen **OR** acetaminophen, albuterol, dextrose, fluticasone, ondansetron **OR** ondansetron (ZOFRAN) IV, polyethylene glycol   Antibiotics   Anti-infectives    Start     Dose/Rate Route Frequency Ordered Stop   03/27/15 2215  levofloxacin (LEVAQUIN) IVPB 750 mg  Status:  Discontinued     750 mg 100 mL/hr over 90 Minutes Intravenous Every 24 hours 03/27/15 2200 03/27/15 2205   03/27/15 2215  levofloxacin (LEVAQUIN) IVPB  750 mg     750 mg 100 mL/hr over 90 Minutes Intravenous Every 48 hours 03/27/15 2205          Subjective:   [name] was seen and examined today.  Patient denies dizziness, chest pain, abdominal pain, N/V/D/C, new weakness, numbess, tingling. No acute events overnight.  Shortness of breath and coughing improving   Objective:   Filed Vitals:   03/28/15 0427 03/28/15 0633 03/28/15 0900 03/28/15 1100  BP: 88/43 94/52 106/47 97/53  Pulse: 95  90   Temp: 98.3 F (36.8 C)  97.5 F (36.4 C)   TempSrc: Oral  Oral   Resp: 24  21   Height:      Weight:      SpO2: 100%  95%     Intake/Output Summary (Last 24 hours) at 03/28/15 1125 Last data filed at 03/28/15 1035  Gross per 24 hour  Intake   2360 ml  Output    800 ml  Net   1560 ml     Wt Readings from Last 3 Encounters:  03/27/15 173.2 kg (381 lb 13.4 oz)  03/11/15 181.575 kg (400 lb 4.8 oz)  02/10/15 176.495 kg (389 lb 1.6 oz)     Exam  General: Alert and oriented x 3, NAD  HEENT:  PERRLA, EOMI, Anicteric Sclera, mucous membranes moist.   Neck: Supple, no JVD, no masses  CVS: S1 S2 auscultated, no rubs, murmurs or gallops. Regular rate and rhythm.  Respiratory: Mild scattered wheezing  Abdomen: Morbidly obese, Soft, nontender, nondistended, + bowel sounds  Ext: no cyanosis clubbing or edema  Neuro: AAOx3, Cr N's II- XII. Strength 5/5 upper and lower extremities bilaterally  Skin: No rashes  Psych: Normal affect and demeanor, alert and oriented x3    Data Review   Micro Results No results found for this or any previous visit (from the past 240 hour(s)).  Radiology Reports Dg Chest 2 View  03/27/2015  CLINICAL DATA:  Increasing shortness of Breath EXAM: CHEST  2 VIEW COMPARISON:  02/07/2015 FINDINGS: Cardiac shadow remains enlarged. Mild atelectatic changes are noted in the left lung base. No focal confluent infiltrate is seen. The bony structures are within normal limits. IMPRESSION: Left basilar  atelectasis. Electronically Signed   By: Inez Catalina M.D.   On: 03/27/2015 16:27   Abd 1 View (kub)  03/28/2015  CLINICAL DATA:  Diarrhea tonight. EXAM: ABDOMEN - 1 VIEW COMPARISON:  None. FINDINGS: The abdomen is incompletely covered within the field of view of this study. Scattered gas is demonstrated within small and large bowel. No small or large bowel distention is appreciated. Suggestion  of mild small bowel fold thickening. This may indicate enteritis. No radiopaque stones. Degenerative changes in the spine and hips. IMPRESSION: No evidence of bowel obstruction. Nondistended gas-filled small bowel with wall thickening may indicate enteritis. Electronically Signed   By: Lucienne Capers M.D.   On: 03/28/2015 00:03    CBC  Recent Labs Lab 03/27/15 1624 03/28/15 0718  WBC 5.2 5.5  HGB 14.2 13.2  HCT 44.8 42.1  PLT 229 221  MCV 92.9 93.6  MCH 29.5 29.3  MCHC 31.7 31.4  RDW 17.1* 17.0*  LYMPHSABS 1.9  --   MONOABS 0.6  --   EOSABS 0.2  --   BASOSABS 0.1  --     Chemistries   Recent Labs Lab 03/27/15 1624 03/27/15 2308 03/28/15 0718  NA 127*  --  139  K 5.2*  --  3.7  CL 81*  --  94*  CO2 31  --  35*  GLUCOSE 922*  --  186*  BUN 39*  --  35*  CREATININE 2.19*  --  1.97*  CALCIUM 9.7  --  9.3  MG  --  2.4  --   AST 22  --   --   ALT 25  --   --   ALKPHOS 96  --   --   BILITOT 0.8  --   --    ------------------------------------------------------------------------------------------------------------------ estimated creatinine clearance is 67.7 mL/min (by C-G formula based on Cr of 1.97). ------------------------------------------------------------------------------------------------------------------ No results for input(s): HGBA1C in the last 72 hours. ------------------------------------------------------------------------------------------------------------------ No results for input(s): CHOL, HDL, LDLCALC, TRIG, CHOLHDL, LDLDIRECT in the last 72  hours. ------------------------------------------------------------------------------------------------------------------ No results for input(s): TSH, T4TOTAL, T3FREE, THYROIDAB in the last 72 hours.  Invalid input(s): FREET3 ------------------------------------------------------------------------------------------------------------------ No results for input(s): VITAMINB12, FOLATE, FERRITIN, TIBC, IRON, RETICCTPCT in the last 72 hours.  Coagulation profile No results for input(s): INR, PROTIME in the last 168 hours.  No results for input(s): DDIMER in the last 72 hours.  Cardiac Enzymes  Recent Labs Lab 03/27/15 2308  TROPONINI <0.03   ------------------------------------------------------------------------------------------------------------------ Invalid input(s): POCBNP   Recent Labs  03/28/15 0320 03/28/15 0422 03/28/15 0532 03/28/15 0636 03/28/15 0751 03/28/15 0901  GLUCAP 300* 298* 241* 185* 174* 170*     Richad Ramsay M.D. [facility] 03/28/2015, 11:25 AM  Pager: [phone] Between 7am to 7pm - call Pager - [phone]  After 7pm go to [email or web address] - password [email or web address]  Call night coverage person covering after 7pm

## 2015-03-28 NOTE — Progress Notes (Signed)
Inpatient Diabetes Program Recommendations  AACE/ADA: New Consensus Statement on Inpatient Glycemic Control (2015)  Target Ranges:  Prepandial:   less than 140 mg/dL      Peak postprandial:   less than 180 mg/dL (1-2 hours)      Critically ill patients:  140 - 180 mg/dL   Review of Glycemic Control  Diabetes history: DM 2 Outpatient Diabetes medications: Metformin 500 mg BID Current orders for Inpatient glycemic control: Insulin gtt.  Inpatient Diabetes Program Recommendations: Insulin - IV drip/GlucoStabilizer: Glucose is coming down to 170's, however is still requiring 11 units an hour to keep glucose down to goal. May want to continue insulin gtt for a few more hours to assess insulin needs. HgbA1C: Consider ordering an A1c to assess glucose control over the past 2-3 months.  Note admitting glucose 922 mg/dl. Will see patient later today to discuss glucose control. May see if we can transition patient off IV insulin later at that time when insulin needs decrease.   Thanks,  Tama Headings RN, MSN, [facility] Inpatient Diabetes Coordinator Team Pager [phone] (8a-5p)

## 2015-03-28 NOTE — Progress Notes (Signed)
Cpap refused for set up/use, using Oxygen 2 lpm n/c.

## 2015-03-28 NOTE — Progress Notes (Signed)
Initial Nutrition Assessment  DOCUMENTATION CODES:   Morbid obesity  INTERVENTION:  Encourage adequate PO intake.   Diabetes diet education given.   NUTRITION DIAGNOSIS:   Increased nutrient needs related to acute illness as evidenced by estimated needs.  GOAL:   Patient will meet greater than or equal to 90% of their needs  MONITOR:   PO intake, Weight trends, Labs, I & O's  REASON FOR ASSESSMENT:   Consult Assessment of nutrition requirement/status  ASSESSMENT:   Pt states sx started about 1.5wks ago with incr sob. 3 days ago he began to spit up mucus, yellow and thick. Diff getting it up. Pt was also coughing. Pt also had diaphoresis. Pt got breathing tx from EMS and stayed home. Pt also almost fainted while running erransds. Due to issues & a foul taste uin his mouth pt went to see his MD. When he was seen his MD sent him to the ED for eval due to dehydration and fluctuations in his BP. Presents with hyperglycemia and AKI.  Meal completion this AM 100%. Pt reports appetite has been improving as he reports his taste is coming back. Pt reports poor po intake 5-6 days PTA with just bites of food at meals as he reported taste was altered. Weight has been stable. Pt requested diet education regarding diabetes. Education given.  Pt with no observed significant fat or muscle mass loss.   Labs and medications reviewed.   Diet Order:  Diet Carb Modified Fluid consistency:: Thin; Room service appropriate?: Yes  Skin:  Reviewed, no issues  Last BM:  3/8  Height:   Ht Readings from Last 1 Encounters:  03/27/15 5\' 8"  (1.727 m)    Weight:   Wt Readings from Last 1 Encounters:  03/27/15 381 lb 13.4 oz (173.2 kg)    Ideal Body Weight:  70 kg  BMI:  Body mass index is 58.07 kg/(m^2).  Estimated Nutritional Needs:   Kcal:  2100-2400  Protein:  130-145 grams  Fluid:  Per MD  EDUCATION NEEDS:   Education needs addressed  Corrin Parker, MS, RD, LDN Pager #  [phone] After hours/ weekend pager # [phone]

## 2015-03-28 NOTE — Plan of Care (Signed)
Problem: Food- and Nutrition-Related Knowledge Deficit (NB-1.1) Goal: Nutrition education Formal process to instruct or train a patient/client in a skill or to impart knowledge to help patients/clients voluntarily manage or modify food choices and eating behavior to maintain or improve health. Outcome: Completed/Met Date Met:  03/28/15  RD consulted for nutrition education regarding diabetes.     Lab Results  Component Value Date    HGBA1C 7.9* 12/29/2014    RD provided "Carbohydrate Counting for People with Diabetes" handout from the Academy of Nutrition and Dietetics. Discussed different food groups and their effects on blood sugar, emphasizing carbohydrate-containing foods. Provided list of carbohydrates and recommended serving sizes of common foods.  Discussed importance of controlled and consistent carbohydrate intake throughout the day. Provided examples of ways to balance meals/snacks and encouraged intake of high-fiber, whole grain complex carbohydrates. Pt reports consuming a large amount of sugar sweetened beverages. Diabetic friendly drink options discussed. Teach back method used.  Expect good compliance.  Corrin Parker, MS, RD, LDN Pager # [phone] After hours/ weekend pager # [phone]

## 2015-03-29 DIAGNOSIS — E1101 Type 2 diabetes mellitus with hyperosmolarity with coma: Secondary | ICD-10-CM

## 2015-03-29 LAB — BASIC METABOLIC PANEL
ANION GAP: 10 (ref 5–15)
BUN: 22 mg/dL — ABNORMAL HIGH (ref 6–20)
CHLORIDE: 94 mmol/L — AB (ref 101–111)
CO2: 31 mmol/L (ref 22–32)
Calcium: 9.3 mg/dL (ref 8.9–10.3)
Creatinine, Ser: 1.27 mg/dL — ABNORMAL HIGH (ref 0.61–1.24)
Glucose, Bld: 306 mg/dL — ABNORMAL HIGH (ref 65–99)
POTASSIUM: 4.6 mmol/L (ref 3.5–5.1)
SODIUM: 135 mmol/L (ref 135–145)

## 2015-03-29 LAB — GLUCOSE, CAPILLARY
GLUCOSE-CAPILLARY: 314 mg/dL — AB (ref 65–99)
Glucose-Capillary: 275 mg/dL — ABNORMAL HIGH (ref 65–99)

## 2015-03-29 LAB — HEMOGLOBIN A1C
Hgb A1c MFr Bld: 11 % — ABNORMAL HIGH (ref 4.8–5.6)
Mean Plasma Glucose: 269 mg/dL

## 2015-03-29 LAB — URINE CULTURE: Culture: NO GROWTH

## 2015-03-29 MED ORDER — INSULIN GLARGINE 100 UNIT/ML ~~LOC~~ SOLN: 50.0000 [IU] | Freq: Every day | SUBCUTANEOUS | Status: DC

## 2015-03-29 MED ORDER — DOXYCYCLINE HYCLATE 100 MG PO TABS: 100.0000 mg | ORAL_TABLET | Freq: Two times a day (BID) | ORAL | Status: DC

## 2015-03-29 MED ORDER — BLOOD GLUCOSE METER KIT: PACK | Status: DC

## 2015-03-29 NOTE — Progress Notes (Signed)
Utilization review completed. Fiorela Pelzer, RN, BSN. 

## 2015-03-29 NOTE — Care Management Note (Signed)
Case Management Note  Patient Details  Name: Timothy Le MRN: [id number] Date of Birth: [birth year]  Subjective/Objective:          CM following for progression and d/c planning.          Action/Plan: Noted request for COPD gold protocol , this pt is independent, lives alone, has a PCP and a Pulmonologist, Dr Annamaria Boots and has [place] Medicaid to assist with medications. Will arrange [facility] services if ordered .   Expected Discharge Date:                  Expected Discharge Plan:  [place]  In-House Referral:  NA  Discharge planning Services  CM Consult  Post Acute Care Choice:  NA Choice offered to:     DME Arranged:  N/A DME Agency:     HH Arranged:  RN [place] Agency:     Status of Service:  In process, will continue to follow  Medicare Important Message Given:    Date Medicare IM Given:    Medicare IM give by:    Date Additional Medicare IM Given:    Additional Medicare Important Message give by:     If discussed at [place] of Stay Meetings, dates discussed:    Additional Comments:  Adron Bene, RN 03/29/2015, 10:35 AM

## 2015-03-29 NOTE — Discharge Summary (Signed)
Physician Discharge Summary  Timothy Le [id number] DOB: Dec 23, 1960 DOA: 03/27/2015  PCP: Kerin Perna, NP  Admit date: 03/27/2015 Discharge date: 03/29/2015  Time spent: > 35 minutes  Recommendations for Outpatient Follow-up:  1.  Please monitor blood pressures and decide when to continue lisinopril with warranted. Patient had elevated serum creatinine as well as soft blood pressures as such held on discharge 2. Patient will be discharged on Lantus for blood sugar control. He is not to continue metformin on discharge 3. Reassess serum creatinine 4. Ensure patient also with his pulmonologist   Discharge Diagnoses:  Principal Problem:   Type 2 diabetes mellitus with hyperosmolar nonketotic hyperglycemia (HCC) Active Problems:   Obstructive sleep apnea   COPD exacerbation (HCC)   Hyperglycemia   Acute on chronic respiratory failure with hypoxemia (HCC)   DM (diabetes mellitus) ([place])   HTN (hypertension)   Near syncope   Discharge Condition: Stable  Diet recommendation: Diabetic diet  Filed Weights   03/27/15 1526 03/27/15 2207 03/28/15 2107  Weight: 173.1 kg (381 lb 9.9 oz) 173.2 kg (381 lb 13.4 oz) 173.4 kg (382 lb 4.4 oz)    History of present illness:  From original history of present illness: 55 year old with history of COPD on home oxygen and diabetes mellitus who presented complaining he almost passed out. Upon further evaluation patient was found to have elevated blood sugars and was placed on insulin drip. Pt diagnosed with HONK and copd exacerbation  Hospital Course:  HONK - resolved with insulin drip -Discharge on Lantus 50 units subcutaneous daily - will provide script for glucometer with lancets and strips - We'll place care order instruction for nursing to teach patient how to use/administer Lantus  COPD - Back at baseline the patient with no active wheezes. Will discharge on 5 more days of doxycycline to complete a 10 day treatment course.  Otherwise patient to continue home prior to admission medications regimen  Hypertension -Blood pressures currently soft as such will discontinue lisinopril hydrochlorothiazide mix secondary to soft blood pressures and elevated serum creatinine. Will defer to primary care physician when to continue this medication regimen  Elevated serum creatinine -Please monitor. Of note patient is not to take any metformin secondary to elevated serum creatinine  Procedures:  None  Consultations:  None  Discharge Exam: Filed Vitals:   03/29/15 0446 03/29/15 0805  BP: 93/55 108/57  Pulse: 86 98  Temp: 98.2 F (36.8 C) 98.1 F (36.7 C)  Resp: 18 19    General: :Pt in nad, alert and awake Cardiovascular: rrr, no mrg Respiratory: cta bl, no wheezes, equal chest rise. Oliver in place  Discharge Instructions   Discharge Instructions    Ambulatory referral to Nutrition and Diabetic Education    Complete by:  As directed   Admitting glucose 922. Patient requesting diet control help.     Call MD for:  difficulty breathing, headache or visual disturbances    Complete by:  As directed      Call MD for:  temperature >100.4    Complete by:  As directed      Diet - low sodium heart healthy    Complete by:  As directed      Discharge instructions    Complete by:  As directed   Please assess your blood sugars at least 2 times daily once fasting and once postprandial.  Also follow-up with your primary care physician so that you can get your serum creatinine rechecked.  Also follow-up with your  pulmonologist or physician who manages your COPD within the next one or 2 weeks or sooner should any new concerns arise     Increase activity slowly    Complete by:  As directed           Current Discharge Medication List    START taking these medications   Details  doxycycline (VIBRA-TABS) 100 MG tablet Take 1 tablet (100 mg total) by mouth every 12 (twelve) hours. Qty: 10 tablet, Refills: 0     insulin glargine (LANTUS) 100 UNIT/ML injection Inject 0.5 mLs (50 Units total) into the skin daily. Qty: 10 mL, Refills: 0      CONTINUE these medications which have NOT CHANGED   Details  amLODipine (NORVASC) 10 MG tablet Take 1 tablet (10 mg total) by mouth daily. Qty: 30 tablet, Refills: 0    beclomethasone (QVAR) 40 MCG/ACT inhaler Inhale 2 puffs into the lungs daily. Qty: 1 Inhaler, Refills: 12    fluticasone (FLONASE) 50 MCG/ACT nasal spray Place 2 sprays into both nostrils daily as needed for allergies.     furosemide (LASIX) 40 MG tablet Take 1 tablet (40 mg total) by mouth 2 (two) times daily. Qty: 60 tablet, Refills: 2    gemfibrozil (LOPID) 600 MG tablet Take 600 mg by mouth 2 (two) times daily before a meal. Thirty minutes before morning and evening meal.    omeprazole (PRILOSEC) 20 MG capsule Take 20 mg by mouth daily as needed (for acid reflex).       STOP taking these medications     lisinopril-hydrochlorothiazide (PRINZIDE,ZESTORETIC) 20-25 MG tablet        No Known Allergies    The results of significant diagnostics from this hospitalization (including imaging, microbiology, ancillary and laboratory) are listed below for reference.    Significant Diagnostic Studies: Dg Chest 2 View  03/27/2015  CLINICAL DATA:  Increasing shortness of Breath EXAM: CHEST  2 VIEW COMPARISON:  02/07/2015 FINDINGS: Cardiac shadow remains enlarged. Mild atelectatic changes are noted in the left lung base. No focal confluent infiltrate is seen. The bony structures are within normal limits. IMPRESSION: Left basilar atelectasis. Electronically Signed   By: Inez Catalina M.D.   On: 03/27/2015 16:27   Abd 1 View (kub)  03/28/2015  CLINICAL DATA:  Diarrhea tonight. EXAM: ABDOMEN - 1 VIEW COMPARISON:  None. FINDINGS: The abdomen is incompletely covered within the field of view of this study. Scattered gas is demonstrated within small and large bowel. No small or large bowel distention is  appreciated. Suggestion of mild small bowel fold thickening. This may indicate enteritis. No radiopaque stones. Degenerative changes in the spine and hips. IMPRESSION: No evidence of bowel obstruction. Nondistended gas-filled small bowel with wall thickening may indicate enteritis. Electronically Signed   By: Lucienne Capers M.D.   On: 03/28/2015 00:03    Microbiology: Recent Results (from the past 240 hour(s))  Blood Culture (routine x 2)     Status: None (Preliminary result)   Collection Time: 03/27/15  3:54 PM  Result Value Ref Range Status   Specimen Description BLOOD RIGHT HAND  Final   Special Requests BOTTLES DRAWN AEROBIC AND ANAEROBIC 5CC  Final   Culture NO GROWTH < 24 HOURS  Final   Report Status PENDING  Incomplete  Blood Culture (routine x 2)     Status: None (Preliminary result)   Collection Time: 03/27/15  4:00 PM  Result Value Ref Range Status   Specimen Description BLOOD LEFT HAND  Final  Special Requests BOTTLES DRAWN AEROBIC AND ANAEROBIC 5CC  Final   Culture NO GROWTH < 24 HOURS  Final   Report Status PENDING  Incomplete  Urine culture     Status: None   Collection Time: 03/27/15  9:06 PM  Result Value Ref Range Status   Specimen Description URINE, CLEAN CATCH  Final   Special Requests NONE  Final   Culture NO GROWTH 2 DAYS  Final   Report Status 03/29/2015 FINAL  Final     Labs: Basic Metabolic Panel:  Recent Labs Lab 03/27/15 1624 03/27/15 2308 03/28/15 0718  NA 127*  --  139  K 5.2*  --  3.7  CL 81*  --  94*  CO2 31  --  35*  GLUCOSE 922*  --  186*  BUN 39*  --  35*  CREATININE 2.19*  --  1.97*  CALCIUM 9.7  --  9.3  MG  --  2.4  --   PHOS  --  3.1  --    Liver Function Tests:  Recent Labs Lab 03/27/15 1624  AST 22  ALT 25  ALKPHOS 96  BILITOT 0.8  PROT 8.0  ALBUMIN 3.7   No results for input(s): LIPASE, AMYLASE in the last 168 hours. No results for input(s): AMMONIA in the last 168 hours. CBC:  Recent Labs Lab 03/27/15 1624  03/28/15 0718  WBC 5.2 5.5  NEUTROABS 2.4  --   HGB 14.2 13.2  HCT 44.8 42.1  MCV 92.9 93.6  PLT 229 221   Cardiac Enzymes:  Recent Labs Lab 03/27/15 2308  TROPONINI <0.03   BNP: BNP (last 3 results)  Recent Labs  12/27/14 1659 02/07/15 1540 03/27/15 1624  BNP 27.9 26.9 9.8    ProBNP (last 3 results) No results for input(s): PROBNP in the last 8760 hours.  CBG:  Recent Labs Lab 03/28/15 1125 03/28/15 1641 03/28/15 2106 03/29/15 0804 03/29/15 1149  GLUCAP 181* 229* 256* 275* 314*     Signed:  [name] MD.  [facility] 03/29/2015, 12:31 PM

## 2015-03-29 NOTE — Progress Notes (Signed)
Inpatient Diabetes Program Recommendations  AACE/ADA: New Consensus Statement on Inpatient Glycemic Control (2015)  Target Ranges:  Prepandial:   less than 140 mg/dL      Peak postprandial:   less than 180 mg/dL (1-2 hours)      Critically ill patients:  140 - 180 mg/dL   Inpatient Diabetes Program Recommendations:  Spoke with patient and states he has not given himself an insulin injection yet. Reviewed with RN Jasmin and plans to teach patient how to give insulin injection prior to discharge. Pt. Has pt education book and injection instruction kit at bedside. Pt. Started watching diabetes education videos on insulin injections and diabetes management. Pt. Is eager to give self injection and assured has home health ordered as followup. Nani Gasser Braelynn Benning, RN, MSN, CDE Inpatient Diabetes Coordinator Pager # [phone] (8 a-5p) 03/29/2015 2:55 PM

## 2015-03-29 NOTE — Progress Notes (Signed)
Pt provided with discharge instruction including information on follow up appointments as well as new medications. Used the teach back method to instruct patient on how to administer insulin to self. Pt also viewed diabetes education video. Pt asked questions and verbalized understanding of all information. Pt was escorted to discharge lounge via wheelchair by NT.

## 2015-04-01 LAB — CULTURE, BLOOD (ROUTINE X 2)
Culture: NO GROWTH
Culture: NO GROWTH

## 2015-04-08 ENCOUNTER — Emergency Department (HOSPITAL_COMMUNITY): Payer: Medicaid Other

## 2015-04-08 ENCOUNTER — Encounter (HOSPITAL_COMMUNITY): Payer: Self-pay

## 2015-04-08 ENCOUNTER — Inpatient Hospital Stay (HOSPITAL_COMMUNITY)
Admission: EM | Admit: 2015-04-08 | Discharge: 2015-04-12 | DRG: 871 | Disposition: A | Discharge status: Home Health | Payer: Medicaid Other | Attending: Internal Medicine | Admitting: Internal Medicine

## 2015-04-08 DIAGNOSIS — N183 Chronic kidney disease, stage 3 (moderate): Secondary | ICD-10-CM | POA: Diagnosis present

## 2015-04-08 DIAGNOSIS — J449 Chronic obstructive pulmonary disease, unspecified: Secondary | ICD-10-CM | POA: Diagnosis present

## 2015-04-08 DIAGNOSIS — Z833 Family history of diabetes mellitus: Secondary | ICD-10-CM

## 2015-04-08 DIAGNOSIS — R739 Hyperglycemia, unspecified: Secondary | ICD-10-CM

## 2015-04-08 DIAGNOSIS — J189 Pneumonia, unspecified organism: Secondary | ICD-10-CM | POA: Diagnosis present

## 2015-04-08 DIAGNOSIS — E1169 Type 2 diabetes mellitus with other specified complication: Secondary | ICD-10-CM

## 2015-04-08 DIAGNOSIS — Y95 Nosocomial condition: Secondary | ICD-10-CM | POA: Diagnosis present

## 2015-04-08 DIAGNOSIS — E1110 Type 2 diabetes mellitus with ketoacidosis without coma: Secondary | ICD-10-CM | POA: Diagnosis present

## 2015-04-08 DIAGNOSIS — R571 Hypovolemic shock: Secondary | ICD-10-CM | POA: Diagnosis present

## 2015-04-08 DIAGNOSIS — Z6841 Body Mass Index (BMI) 40.0 and over, adult: Secondary | ICD-10-CM

## 2015-04-08 DIAGNOSIS — E1100 Type 2 diabetes mellitus with hyperosmolarity without nonketotic hyperglycemic-hyperosmolar coma (NKHHC): Secondary | ICD-10-CM | POA: Diagnosis present

## 2015-04-08 DIAGNOSIS — Z7951 Long term (current) use of inhaled steroids: Secondary | ICD-10-CM | POA: Diagnosis not present

## 2015-04-08 DIAGNOSIS — Z825 Family history of asthma and other chronic lower respiratory diseases: Secondary | ICD-10-CM | POA: Diagnosis not present

## 2015-04-08 DIAGNOSIS — Z79899 Other long term (current) drug therapy: Secondary | ICD-10-CM | POA: Diagnosis not present

## 2015-04-08 DIAGNOSIS — I10 Essential (primary) hypertension: Secondary | ICD-10-CM | POA: Diagnosis present

## 2015-04-08 DIAGNOSIS — A419 Sepsis, unspecified organism: Secondary | ICD-10-CM | POA: Diagnosis not present

## 2015-04-08 DIAGNOSIS — N179 Acute kidney failure, unspecified: Secondary | ICD-10-CM | POA: Diagnosis present

## 2015-04-08 DIAGNOSIS — Z8249 Family history of ischemic heart disease and other diseases of the circulatory system: Secondary | ICD-10-CM | POA: Diagnosis not present

## 2015-04-08 DIAGNOSIS — R0602 Shortness of breath: Secondary | ICD-10-CM

## 2015-04-08 DIAGNOSIS — Z87891 Personal history of nicotine dependence: Secondary | ICD-10-CM | POA: Diagnosis not present

## 2015-04-08 DIAGNOSIS — R05 Cough: Secondary | ICD-10-CM

## 2015-04-08 DIAGNOSIS — R112 Nausea with vomiting, unspecified: Secondary | ICD-10-CM | POA: Diagnosis not present

## 2015-04-08 DIAGNOSIS — G4733 Obstructive sleep apnea (adult) (pediatric): Secondary | ICD-10-CM | POA: Diagnosis present

## 2015-04-08 DIAGNOSIS — R0902 Hypoxemia: Secondary | ICD-10-CM

## 2015-04-08 DIAGNOSIS — J45909 Unspecified asthma, uncomplicated: Secondary | ICD-10-CM | POA: Diagnosis present

## 2015-04-08 DIAGNOSIS — Z794 Long term (current) use of insulin: Secondary | ICD-10-CM | POA: Diagnosis not present

## 2015-04-08 DIAGNOSIS — R059 Cough, unspecified: Secondary | ICD-10-CM

## 2015-04-08 DIAGNOSIS — E1310 Other specified diabetes mellitus with ketoacidosis without coma: Secondary | ICD-10-CM | POA: Diagnosis present

## 2015-04-08 DIAGNOSIS — E875 Hyperkalemia: Secondary | ICD-10-CM | POA: Diagnosis present

## 2015-04-08 DIAGNOSIS — I129 Hypertensive chronic kidney disease with stage 1 through stage 4 chronic kidney disease, or unspecified chronic kidney disease: Secondary | ICD-10-CM | POA: Diagnosis present

## 2015-04-08 DIAGNOSIS — E871 Hypo-osmolality and hyponatremia: Secondary | ICD-10-CM | POA: Diagnosis present

## 2015-04-08 DIAGNOSIS — E119 Type 2 diabetes mellitus without complications: Secondary | ICD-10-CM

## 2015-04-08 DIAGNOSIS — I509 Heart failure, unspecified: Secondary | ICD-10-CM | POA: Diagnosis not present

## 2015-04-08 LAB — RENAL FUNCTION PANEL
ALBUMIN: 3.6 g/dL (ref 3.5–5.0)
ANION GAP: 17 — AB (ref 5–15)
Albumin: 3.9 g/dL (ref 3.5–5.0)
Anion gap: 11 (ref 5–15)
BUN: 64 mg/dL — ABNORMAL HIGH (ref 6–20)
BUN: 55 mg/dL — AB (ref 6–20)
CALCIUM: 9.5 mg/dL (ref 8.9–10.3)
CALCIUM: 8.5 mg/dL — AB (ref 8.9–10.3)
CHLORIDE: 85 mmol/L — AB (ref 101–111)
CO2: 27 mmol/L (ref 22–32)
CO2: 27 mmol/L (ref 22–32)
CREATININE: 3.16 mg/dL — AB (ref 0.61–1.24)
Chloride: 95 mmol/L — ABNORMAL LOW (ref 101–111)
Creatinine, Ser: 2.81 mg/dL — ABNORMAL HIGH (ref 0.61–1.24)
GFR calc Af Amer: 28 mL/min — ABNORMAL LOW (ref >60)
GFR calc non Af Amer: 24 mL/min — ABNORMAL LOW (ref >60)
GFR, EST AFRICAN AMERICAN: 24 mL/min — AB (ref >60)
GFR, EST NON AFRICAN AMERICAN: 21 mL/min — AB (ref >60)
GLUCOSE: 323 mg/dL — AB (ref 65–99)
Glucose, Bld: 603 mg/dL (ref 65–99)
PHOSPHORUS: 2.8 mg/dL (ref 2.5–4.6)
Phosphorus: 2.6 mg/dL (ref 2.5–4.6)
Potassium: 4.9 mmol/L (ref 3.5–5.1)
Potassium: 5 mmol/L (ref 3.5–5.1)
SODIUM: 129 mmol/L — AB (ref 135–145)
SODIUM: 133 mmol/L — AB (ref 135–145)

## 2015-04-08 LAB — COMPREHENSIVE METABOLIC PANEL
ALK PHOS: 98 U/L (ref 38–126)
ALT: 27 U/L (ref 17–63)
AST: 26 U/L (ref 15–41)
Albumin: 3.9 g/dL (ref 3.5–5.0)
Anion gap: 13 (ref 5–15)
BUN: 62 mg/dL — AB (ref 6–20)
CALCIUM: 9.4 mg/dL (ref 8.9–10.3)
CO2: 30 mmol/L (ref 22–32)
CREATININE: 3.05 mg/dL — AB (ref 0.61–1.24)
Chloride: 83 mmol/L — ABNORMAL LOW (ref 101–111)
GFR, EST AFRICAN AMERICAN: 25 mL/min — AB (ref >60)
GFR, EST NON AFRICAN AMERICAN: 22 mL/min — AB (ref >60)
Glucose, Bld: 590 mg/dL (ref 65–99)
Potassium: 4.8 mmol/L (ref 3.5–5.1)
SODIUM: 126 mmol/L — AB (ref 135–145)
Total Bilirubin: 1 mg/dL (ref 0.3–1.2)
Total Protein: 8.1 g/dL (ref 6.5–8.1)

## 2015-04-08 LAB — URINALYSIS W MICROSCOPIC (NOT AT ARMC)
Bilirubin Urine: NEGATIVE
GLUCOSE, UA: 500 mg/dL — AB
KETONES UR: NEGATIVE mg/dL
Leukocytes, UA: NEGATIVE
NITRITE: NEGATIVE
PROTEIN: NEGATIVE mg/dL
Specific Gravity, Urine: 1.008 (ref 1.005–1.030)
pH: 5.5 (ref 5.0–8.0)

## 2015-04-08 LAB — CBC
HEMATOCRIT: 46 % (ref 39.0–52.0)
Hemoglobin: 14.6 g/dL (ref 13.0–17.0)
MCH: 29.4 pg (ref 26.0–34.0)
MCHC: 31.7 g/dL (ref 30.0–36.0)
MCV: 92.6 fL (ref 78.0–100.0)
Platelets: 257 10*3/uL (ref 150–400)
RBC: 4.97 MIL/uL (ref 4.22–5.81)
RDW: 16.2 % — AB (ref 11.5–15.5)
WBC: 6.9 10*3/uL (ref 4.0–10.5)

## 2015-04-08 LAB — RAPID URINE DRUG SCREEN, HOSP PERFORMED
AMPHETAMINES: NOT DETECTED
BENZODIAZEPINES: NOT DETECTED
Barbiturates: NOT DETECTED
Cocaine: NOT DETECTED
OPIATES: NOT DETECTED
Tetrahydrocannabinol: NOT DETECTED

## 2015-04-08 LAB — CBG MONITORING, ED
GLUCOSE-CAPILLARY: 326 mg/dL — AB (ref 65–99)
Glucose-Capillary: 594 mg/dL (ref 65–99)
Glucose-Capillary: 444 mg/dL — ABNORMAL HIGH (ref 65–99)
Glucose-Capillary: 334 mg/dL — ABNORMAL HIGH (ref 65–99)
Glucose-Capillary: 236 mg/dL — ABNORMAL HIGH (ref 65–99)

## 2015-04-08 LAB — LACTIC ACID, PLASMA
Lactic Acid, Venous: 1.7 mmol/L (ref 0.5–2.0)
Lactic Acid, Venous: 1.5 mmol/L (ref 0.5–2.0)

## 2015-04-08 LAB — PROCALCITONIN: Procalcitonin: 0.44 ng/mL

## 2015-04-08 MED ORDER — INSULIN ASPART 100 UNIT/ML ~~LOC~~ SOLN
10.0000 [IU] | Freq: Once | SUBCUTANEOUS | Status: AC
  Administered 2015-04-08: 10 [IU] via INTRAVENOUS
  Filled 2015-04-08: qty 1

## 2015-04-08 MED ORDER — SODIUM CHLORIDE 0.9 % IV SOLN
1000.0000 mL | INTRAVENOUS | Status: DC
  Administered 2015-04-08: 1000 mL via INTRAVENOUS

## 2015-04-08 MED ORDER — ALBUTEROL SULFATE (2.5 MG/3ML) 0.083% IN NEBU
2.5000 mg | INHALATION_SOLUTION | Freq: Four times a day (QID) | RESPIRATORY_TRACT | Status: DC
  Administered 2015-04-08: 2.5 mg via RESPIRATORY_TRACT
  Filled 2015-04-08: qty 3

## 2015-04-08 MED ORDER — GUAIFENESIN ER 600 MG PO TB12
600.0000 mg | ORAL_TABLET | Freq: Two times a day (BID) | ORAL | Status: DC
  Administered 2015-04-09 (×2): 600 mg via ORAL
  Filled 2015-04-08 (×2): qty 1

## 2015-04-08 MED ORDER — POTASSIUM CHLORIDE 10 MEQ/100ML IV SOLN
10.0000 meq | INTRAVENOUS | Status: AC
  Administered 2015-04-09: 10 meq via INTRAVENOUS

## 2015-04-08 MED ORDER — MORPHINE SULFATE (PF) 4 MG/ML IV SOLN
4.0000 mg | Freq: Once | INTRAVENOUS | Status: AC
  Administered 2015-04-08: 4 mg via INTRAVENOUS
  Filled 2015-04-08: qty 1

## 2015-04-08 MED ORDER — IPRATROPIUM BROMIDE 0.02 % IN SOLN
0.5000 mg | Freq: Four times a day (QID) | RESPIRATORY_TRACT | Status: DC
  Administered 2015-04-08: 0.5 mg via RESPIRATORY_TRACT
  Filled 2015-04-08: qty 2.5

## 2015-04-08 MED ORDER — ALBUTEROL SULFATE (2.5 MG/3ML) 0.083% IN NEBU: 2.5000 mg | INHALATION_SOLUTION | RESPIRATORY_TRACT | Status: DC | PRN

## 2015-04-08 MED ORDER — ACETAMINOPHEN 650 MG RE SUPP: 650.0000 mg | Freq: Four times a day (QID) | RECTAL | Status: DC | PRN

## 2015-04-08 MED ORDER — VANCOMYCIN HCL IN DEXTROSE 1-5 GM/200ML-% IV SOLN: 1000.0000 mg | Freq: Once | INTRAVENOUS | Status: DC

## 2015-04-08 MED ORDER — SODIUM CHLORIDE 0.9% FLUSH
3.0000 mL | Freq: Two times a day (BID) | INTRAVENOUS | Status: DC
  Administered 2015-04-09 – 2015-04-11 (×5): 3 mL via INTRAVENOUS

## 2015-04-08 MED ORDER — SODIUM CHLORIDE 0.9 % IV SOLN
2000.0000 mg | INTRAVENOUS | Status: DC
  Administered 2015-04-08: 2000 mg via INTRAVENOUS
  Filled 2015-04-08: qty 2000

## 2015-04-08 MED ORDER — ONDANSETRON HCL 4 MG/2ML IJ SOLN: 4.0000 mg | Freq: Four times a day (QID) | INTRAMUSCULAR | Status: DC | PRN

## 2015-04-08 MED ORDER — INSULIN REGULAR HUMAN 100 UNIT/ML IJ SOLN
INTRAMUSCULAR | Status: DC
  Administered 2015-04-08: 2.7 [IU]/h via INTRAVENOUS

## 2015-04-08 MED ORDER — ACETAMINOPHEN 325 MG PO TABS: 650.0000 mg | ORAL_TABLET | Freq: Four times a day (QID) | ORAL | Status: DC | PRN

## 2015-04-08 MED ORDER — SODIUM CHLORIDE 0.9 % IV SOLN
1000.0000 mL | Freq: Once | INTRAVENOUS | Status: AC
  Administered 2015-04-08: 1000 mL via INTRAVENOUS

## 2015-04-08 MED ORDER — DEXTROSE-NACL 5-0.45 % IV SOLN
INTRAVENOUS | Status: DC
  Administered 2015-04-09 (×2): via INTRAVENOUS

## 2015-04-08 MED ORDER — HEPARIN SODIUM (PORCINE) 5000 UNIT/ML IJ SOLN
5000.0000 [IU] | Freq: Three times a day (TID) | INTRAMUSCULAR | Status: DC
  Administered 2015-04-09: 5000 [IU] via SUBCUTANEOUS
  Filled 2015-04-08 (×6): qty 1

## 2015-04-08 MED ORDER — ONDANSETRON HCL 4 MG/2ML IJ SOLN
4.0000 mg | Freq: Once | INTRAMUSCULAR | Status: AC
  Administered 2015-04-08: 4 mg via INTRAVENOUS
  Filled 2015-04-08: qty 2

## 2015-04-08 MED ORDER — ONDANSETRON HCL 4 MG PO TABS: 4.0000 mg | ORAL_TABLET | Freq: Four times a day (QID) | ORAL | Status: DC | PRN

## 2015-04-08 MED ORDER — SODIUM CHLORIDE 0.9 % IV BOLUS (SEPSIS)
1000.0000 mL | INTRAVENOUS | Status: AC
  Administered 2015-04-08 (×2): 1000 mL via INTRAVENOUS

## 2015-04-08 MED ORDER — PIPERACILLIN-TAZOBACTAM 3.375 G IVPB
3.3750 g | Freq: Three times a day (TID) | INTRAVENOUS | Status: DC
  Administered 2015-04-08 – 2015-04-11 (×8): 3.375 g via INTRAVENOUS
  Filled 2015-04-08 (×7): qty 50

## 2015-04-08 MED ORDER — SODIUM CHLORIDE 0.9 % IV SOLN
INTRAVENOUS | Status: DC
  Administered 2015-04-08: 3.8 [IU]/h via INTRAVENOUS
  Filled 2015-04-08: qty 2.5

## 2015-04-08 MED ORDER — SODIUM CHLORIDE 0.9 % IV BOLUS (SEPSIS)
500.0000 mL | INTRAVENOUS | Status: AC
  Administered 2015-04-08 (×3): 500 mL via INTRAVENOUS

## 2015-04-08 MED ORDER — SODIUM CHLORIDE 0.9 % IV SOLN
INTRAVENOUS | Status: DC
  Administered 2015-04-08: 22:00:00 via INTRAVENOUS

## 2015-04-08 MED ORDER — PIPERACILLIN-TAZOBACTAM 3.375 G IVPB 30 MIN: 3.3750 g | Freq: Once | INTRAVENOUS | Status: DC

## 2015-04-08 MED ORDER — IPRATROPIUM-ALBUTEROL 0.5-2.5 (3) MG/3ML IN SOLN
3.0000 mL | Freq: Three times a day (TID) | RESPIRATORY_TRACT | Status: DC
  Administered 2015-04-09 – 2015-04-11 (×7): 3 mL via RESPIRATORY_TRACT
  Filled 2015-04-08 (×10): qty 3

## 2015-04-08 MED ORDER — PIPERACILLIN-TAZOBACTAM 3.375 G IVPB 30 MIN
3.3750 g | Freq: Once | INTRAVENOUS | Status: DC
  Filled 2015-04-08: qty 50

## 2015-04-08 NOTE — ED Notes (Signed)
Made two attemots to draw blood and was unsuccessful.

## 2015-04-08 NOTE — ED Notes (Signed)
Bed: WA04 Expected date:  Expected time:  Means of arrival:  Comments: Ems 

## 2015-04-08 NOTE — Progress Notes (Signed)
RT x 2 attempted to draw ABG sample. Both RTs unsuccessful and Dr. Posey Pronto paged.

## 2015-04-08 NOTE — H&P (Signed)
[facility] History and Physical   Patient: Timothy Le   PCP: Kerin Perna, NP DOB: [birth year]   DOA: 04/08/2015   DOS: 04/08/2015   DOS: the patient was seen and examined on 04/08/2015  Referring physician: Dr. Venora Maples Chief Complaint: Hyperglycemia  HPI: Timothy Le is a 55 y.o. male with Past medical history of type 2 diabetes mellitus uncontrolled, COPD, sleep apnea, morbid obesity, hypertension. The patient presents with complains of high blood glucose. Patient mentions that he was checking his blood glucose at home and this was 500. He has been having nausea and vomiting since last 3 days. He has loose watery bowel movement 21 this morning. He denies having any complaints of chest pain or abdominal pain. Continues to complain of cough as well as shortness of breath. No fever no juices been reported. Appears to be lethargic. Mentions that he is compliant with all his medications. Denies any drug abuse. Also denies having any focal deficit  The patient is coming from home.  At his baseline ambulates without any support And is independent for most of his ADL; manages his medication on his own.  Review of Systems: as mentioned in the history of present illness.  A comprehensive review of the other systems is negative.  Past Medical History  Diagnosis Date  . Asthma   . Hypertension   . Sleep apnea   . COPD (chronic obstructive pulmonary disease) ([place])   . Diabetes mellitus without complication ([place])   . Shortness of breath dyspnea    History reviewed. No pertinent past surgical history. Social History:  reports that he quit smoking about 2 years ago. His smoking use included Cigarettes. He started smoking about 31 years ago. He has a .25 pack-year smoking history. He has never used smokeless tobacco. He reports that he does not drink alcohol or use illicit drugs.  No Known Allergies  Family History  Problem Relation Age of Onset  .  Asthma Sister   . Asthma Other     nephew  . Hypertension Sister   . Diabetes type II Sister     Prior to Admission medications   Medication Sig Start Date End Date Taking? Authorizing Provider  amLODipine (NORVASC) 10 MG tablet Take 1 tablet (10 mg total) by mouth daily. 12/30/14  Yes Debbe Odea, MD  beclomethasone (QVAR) 40 MCG/ACT inhaler Inhale 2 puffs into the lungs daily. 07/18/13  Yes Jennifer Piepenbrink, PA-C  cholecalciferol (VITAMIN D) 1000 units tablet Take 1,000 Units by mouth daily.   Yes Historical Provider, MD  doxycycline (VIBRA-TABS) 100 MG tablet Take 1 tablet (100 mg total) by mouth every 12 (twelve) hours. 03/29/15  Yes [name], MD  fluticasone (FLONASE) 50 MCG/ACT nasal spray Place 2 sprays into both nostrils daily as needed for allergies.    Yes Historical Provider, MD  furosemide (LASIX) 40 MG tablet Take 1 tablet (40 mg total) by mouth 2 (two) times daily. 02/10/15  Yes Norman Herrlich, MD  gemfibrozil (LOPID) 600 MG tablet Take 600 mg by mouth 2 (two) times daily before a meal. Thirty minutes before morning and evening meal.   Yes Historical Provider, MD  insulin glargine (LANTUS) 100 UNIT/ML injection Inject 0.5 mLs (50 Units total) into the skin daily. 03/29/15  Yes [name], MD  lisinopril-hydrochlorothiazide (PRINZIDE,ZESTORETIC) 20-25 MG tablet Take 1 tablet by mouth daily.   Yes Historical Provider, MD  naproxen (NAPROSYN) 500 MG tablet Take 500 mg by mouth 2 (two) times daily  as needed for mild pain.   Yes Historical Provider, MD  omeprazole (PRILOSEC) 20 MG capsule Take 20 mg by mouth daily as needed (for acid reflex).    Yes Historical Provider, MD  potassium chloride (K-DUR) 10 MEQ tablet Take 10 mEq by mouth daily.   Yes Historical Provider, MD  triamcinolone cream (KENALOG) 0.1 % Apply 1 application topically daily.   Yes Historical Provider, MD  blood glucose meter kit and supplies Dispense based on patient and insurance preference. Use up to four  times daily as directed. (FOR ICD-9 250.00, 250.01). 03/29/15   [name], MD    Physical Exam: Filed Vitals:   04/08/15 1648 04/08/15 1714 04/08/15 1740 04/08/15 1840  BP: '81/53 67/42 82/34 ' 108/65  Pulse: 88 75 74 70  Temp:      TempSrc:      Resp: '21 14 30 20  ' SpO2: 95% 94% 95% 98%    General: Alert, Awake and Oriented to Time, Place and Person. Appear in marked distress Eyes: PERRL ENT: Oral Mucosa clear dry. Neck: Difficult to assess JVD Cardiovascular: S1 and S2 Present, distant heart sounds Respiratory: Bilateral Air entry equal and Decreased,  no Crackles, occasional wheezes Abdomen: Bowel Sound present, Soft and no tenderness Skin: no Rash Extremities: bilateral Pedal edema, no calf tenderness Neurologic: Grossly no focal neuro deficit.  Labs on Admission:  CBC:  Recent Labs Lab 04/08/15 1531  WBC 6.9  HGB 14.6  HCT 46.0  MCV 92.6  PLT 257    CMP     Component Value Date/Time   NA 126* 04/08/2015 1531   NA 129* 04/08/2015 1531   K 4.8 04/08/2015 1531   K 4.9 04/08/2015 1531   CL 83* 04/08/2015 1531   CL 85* 04/08/2015 1531   CO2 30 04/08/2015 1531   CO2 27 04/08/2015 1531   GLUCOSE 590* 04/08/2015 1531   GLUCOSE 603* 04/08/2015 1531   BUN 62* 04/08/2015 1531   BUN 64* 04/08/2015 1531   CREATININE 3.05* 04/08/2015 1531   CREATININE 3.16* 04/08/2015 1531   CALCIUM 9.4 04/08/2015 1531   CALCIUM 9.5 04/08/2015 1531   PROT 8.1 04/08/2015 1531   ALBUMIN 3.9 04/08/2015 1531   ALBUMIN 3.9 04/08/2015 1531   AST 26 04/08/2015 1531   ALT 27 04/08/2015 1531   ALKPHOS 98 04/08/2015 1531   BILITOT 1.0 04/08/2015 1531   GFRNONAA 22* 04/08/2015 1531   GFRNONAA 21* 04/08/2015 1531   GFRAA 25* 04/08/2015 1531   GFRAA 24* 04/08/2015 1531    No results for input(s): CKTOTAL, CKMB, CKMBINDEX, TROPONINI in the last 168 hours. BNP (last 3 results)  Recent Labs  12/27/14 1659 02/07/15 1540 03/27/15 1624  BNP 27.9 26.9 9.8    ProBNP (last 3  results) No results for input(s): PROBNP in the last 8760 hours.   Radiological Exams on Admission: Dg Chest 2 View  04/08/2015  CLINICAL DATA:  Cough with shortness of breath for 2 months. Nonsmoker. EXAM: CHEST  2 VIEW COMPARISON:  03/27/2015 and 02/07/2015. FINDINGS: Interval improved aeration of the lung bases on the frontal examination. There are persistent low lung volumes and mild breathing artifact on the lateral view. The heart size and mediastinal contours are stable. The lungs appear clear based on the frontal examination. There is no pleural effusion or pneumothorax. IMPRESSION: Limited lateral view. No active cardiopulmonary process demonstrated on the frontal examination. Electronically Signed   By: Richardean Sale M.D.   On: 04/08/2015 15:13   Assessment/Plan 1. Hypovolemic  shock ([place]) DKA. Acute on chronic kidney disease. Suspected sepsis with a respiratory infection  Patient's blood pressure has been ranging in 60s over 40s. This is despite 2 L of IV fluid bolus. Although lactic acid is not significantly elevated. Given his body habitus it has been difficult to evaluate the patient as well as make an accurate reading of the blood pressure. Patient will be admitted to the stepdown unit. I would continue hydrate him with IV fluids, will give him bolus as per 12m/kg  per sepsis protocol. Vancomycin and Zosyn has been initiated for an unidentified infection. Patient will be also started on DKA protocol with BMP every 4 hours. Critical care has been requested to monitor the patient electronically. Patient may have issues with his IV access as well.  2. Cough and shortness of breath. Check influenza PCR. Possibility of pneumonia cannot be ruled out. Continue aggressive IV hydration as well as vancomycin and Zosyn due to his recent hospitalization.  3. Obstructive sleep apnea. Continue Cpap.  4. Hyponatremia. Due to hyperglycemia pseudohyponatremia. Continue  monitoring.  Nutrition: Nothing by mouth at present. Transition to diabetic diet once the patient is out of the DKA DVT Prophylaxis: subcutaneous Heparin  Advance goals of care discussion: Full code   Consults: none  Family Communication: no family was present at bedside, at the time of interview.  Disposition: Admitted as inpatient, step-down unit.  Author: PBerle Mull MD [facility] Pager: [phone]/20/2017  If 7PM-7AM, please contact night-coverage [email or web address] Password [email or web address]

## 2015-04-08 NOTE — Progress Notes (Addendum)
Spoke with pt regarding cpap.  Pt stated he does not wear cpap at home and does not want it tonight.  Pt stated that he can't stand it on his face and that it takes his breath away.  Pt was advised that RT is available all night should he change his mind.

## 2015-04-08 NOTE — ED Notes (Signed)
NURSES IS GOING TO CALL THE IV TEAM

## 2015-04-08 NOTE — ED Notes (Signed)
Per [facility]- Pt states did not feel well yesterday. Took 50 units Lantus SQ. Has only has bowel of grits. Compliant with medications. Pt at home [facility] and BIPAP. Just discharge this week with Hyperglycemia. Pt presents with cough and congestion. Using OTC medications without relief

## 2015-04-08 NOTE — Progress Notes (Addendum)
Pharmacy Antibiotic Note  Timothy Le is a 55 y.o. male admitted on 04/08/2015 with sepsis.  Pharmacy has been consulted for Vancomycin & Zosyn dosing. 04/08/2015:   WBC, Lactic acid wnl.  Hypotensive  AKI- NCrCl ~16ml/min.  Afebrile  Plan:  Zosyn 3.375gm IV Q8h to be infused over 4hrs  Vancomycin 2g IV q48h (using obese nomogram)  Check Vancomycin trough at steady state  Monitor renal function and cx data   Duration of therapy per MD- de-escalate or discontinue abx as soon as clinically appropriate     Temp (24hrs), Avg:98.4 F (36.9 C), Min:98.1 F (36.7 C), Max:98.7 F (37.1 C)   Recent Labs Lab 04/08/15 1531  WBC 6.9  CREATININE 3.05*  LATICACIDVEN 1.7    Estimated Creatinine Clearance: 43.7 mL/min (by C-G formula based on Cr of 3.05).    No Known Allergies  Antimicrobials this admission: Zosyn 3/20  >>  Vanc 3/20 >>   Dose adjustments this admission:  Microbiology results: 3/20 BCx: sent  Thank you for allowing pharmacy to be a part of this patient's care.  Netta Cedars, PharmD, BCPS Pager: [phone] 04/08/2015 6:02 PM

## 2015-04-08 NOTE — ED Notes (Signed)
IV ATTEMPT X1 UNSUCCESSFUL. 

## 2015-04-08 NOTE — Progress Notes (Signed)
PATIENT HAS MEDS FROM HOME-NEED TO BE LOCKED UP OR SENT HOME

## 2015-04-08 NOTE — ED Notes (Addendum)
Pt. Is unable to use the restroom at this time, but is aware that we need a urine specimen. Urinal at bedside. 

## 2015-04-08 NOTE — ED Provider Notes (Signed)
CSN: [id number]     Arrival date & time 04/08/15  1342 History   First MD Initiated Contact with Patient 04/08/15 1354     Chief Complaint  Patient presents with  . Hyperglycemia    HPI Patient was discharged from the hospital several days ago and reports diarrhea 2 days ago and nausea and vomiting yesterday.  Denies nausea vomiting or diarrhea today but reports feeling generally weak.  His blood sugar was found to be greater than 500 on arrival to the emergency department.  He states during his last hospitalization he was recently started on insulin to better control his diabetes.  He reports compliance with his insulin since discharge.  His diet still sounds questionable.  Reports ongoing productive cough.  No significant shortness of breath.  Denies abdominal pain.   Past Medical History  Diagnosis Date  . Asthma   . Hypertension   . Sleep apnea   . COPD (chronic obstructive pulmonary disease) ([place])   . Diabetes mellitus without complication ([place])   . Shortness of breath dyspnea    History reviewed. No pertinent past surgical history. Family History  Problem Relation Age of Onset  . Asthma Sister   . Asthma Other     nephew  . Hypertension Sister   . Diabetes type II Sister    Social History  Substance Use Topics  . Smoking status: Former Smoker -- 0.50 packs/day for .5 years    Types: Cigarettes    Start date: 07/28/1983    Quit date: 02/27/2013  . Smokeless tobacco: Never Used  . Alcohol Use: No     Comment: quit ETOH about 5 months ago-beer and liquor    Review of Systems  All other systems reviewed and are negative.     Allergies  Review of patient's allergies indicates no known allergies.  Home Medications   Prior to Admission medications   Medication Sig Start Date End Date Taking? Authorizing Provider  amLODipine (NORVASC) 10 MG tablet Take 1 tablet (10 mg total) by mouth daily. 12/30/14   Debbe Odea, MD  beclomethasone (QVAR) 40 MCG/ACT inhaler  Inhale 2 puffs into the lungs daily. 07/18/13   Jennifer Piepenbrink, PA-C  blood glucose meter kit and supplies Dispense based on patient and insurance preference. Use up to four times daily as directed. (FOR ICD-9 250.00, 250.01). 03/29/15   [name], MD  doxycycline (VIBRA-TABS) 100 MG tablet Take 1 tablet (100 mg total) by mouth every 12 (twelve) hours. 03/29/15   [name], MD  fluticasone (FLONASE) 50 MCG/ACT nasal spray Place 2 sprays into both nostrils daily as needed for allergies.     Historical Provider, MD  furosemide (LASIX) 40 MG tablet Take 1 tablet (40 mg total) by mouth 2 (two) times daily. 02/10/15   Norman Herrlich, MD  gemfibrozil (LOPID) 600 MG tablet Take 600 mg by mouth 2 (two) times daily before a meal. Thirty minutes before morning and evening meal.    Historical Provider, MD  insulin glargine (LANTUS) 100 UNIT/ML injection Inject 0.5 mLs (50 Units total) into the skin daily. 03/29/15   [name], MD  omeprazole (PRILOSEC) 20 MG capsule Take 20 mg by mouth daily as needed (for acid reflex).     Historical Provider, MD   BP 90/56 mmHg  Pulse 84  Temp(Src) 98.7 F (37.1 C) (Rectal)  Resp 20  SpO2 90% Physical Exam  Constitutional: He is oriented to person, place, and time. He appears well-developed and well-nourished.  Morbidly  obese  HENT:  Head: Normocephalic and atraumatic.  Eyes: EOM are normal.  Neck: Normal range of motion.  Cardiovascular: Normal rate, regular rhythm, normal heart sounds and intact distal pulses.   Pulmonary/Chest: Effort normal and breath sounds normal. No respiratory distress.  Abdominal: Soft. He exhibits no distension. There is no tenderness.  Musculoskeletal: Normal range of motion.  Neurological: He is alert and oriented to person, place, and time.  Skin: Skin is warm and dry.  Psychiatric: He has a normal mood and affect. Judgment normal.  Nursing note and vitals reviewed.   ED Course  Procedures (including critical care  time) Labs Review Labs Reviewed  CBC - Abnormal; Notable for the following:    RDW 16.2 (*)    All other components within normal limits  COMPREHENSIVE METABOLIC PANEL - Abnormal; Notable for the following:    Sodium 126 (*)    Chloride 83 (*)    Glucose, Bld 590 (*)    BUN 62 (*)    Creatinine, Ser 3.05 (*)    GFR calc non Af Amer 22 (*)    GFR calc Af Amer 25 (*)    All other components within normal limits  CBG MONITORING, ED - Abnormal; Notable for the following:    Glucose-Capillary 594 (*)    All other components within normal limits  LACTIC ACID, PLASMA  URINALYSIS, ROUTINE W REFLEX MICROSCOPIC (NOT AT [facility])   BUN  Date Value Ref Range Status  04/08/2015 62* 6 - 20 mg/dL Final  03/29/2015 22* 6 - 20 mg/dL Final  03/28/2015 35* 6 - 20 mg/dL Final  03/27/2015 39* 6 - 20 mg/dL Final   CREATININE, SER  Date Value Ref Range Status  04/08/2015 3.05* 0.61 - 1.24 mg/dL Final  03/29/2015 1.27* 0.61 - 1.24 mg/dL Final  03/28/2015 1.97* 0.61 - 1.24 mg/dL Final  03/27/2015 2.19* 0.61 - 1.24 mg/dL Final       Imaging Review Dg Chest 2 View  04/08/2015  CLINICAL DATA:  Cough with shortness of breath for 2 months. Nonsmoker. EXAM: CHEST  2 VIEW COMPARISON:  03/27/2015 and 02/07/2015. FINDINGS: Interval improved aeration of the lung bases on the frontal examination. There are persistent low lung volumes and mild breathing artifact on the lateral view. The heart size and mediastinal contours are stable. The lungs appear clear based on the frontal examination. There is no pleural effusion or pneumothorax. IMPRESSION: Limited lateral view. No active cardiopulmonary process demonstrated on the frontal examination. Electronically Signed   By: Richardean Sale M.D.   On: 04/08/2015 15:13   I have personally reviewed and evaluated these images and lab results as part of my medical decision-making.   EKG Interpretation None      MDM   Final diagnoses:  None    IV fluids and  insulin given.  Much of this is likely hyperglycemia.  Likely mild dehydration and possible hyperosmolar nonketotic.  Patient be reevaluated after IV fluids.  Awaiting electrolytes   4:08 PM Patient with acute on chronic renal insufficiency.  His creatinine today is 3.05.  At time of discharge 10 days ago was 1.27.  He will be admitted the hospital for ongoing management of his poorly controlled diabetes and ongoing hydration.  Much of his renal function is likely prerenal given his poorly controlled diabetes.  Admission to the hospital.     Jola Schmidt, MD 04/08/15 [phone]

## 2015-04-09 ENCOUNTER — Inpatient Hospital Stay (HOSPITAL_COMMUNITY): Payer: Medicaid Other

## 2015-04-09 LAB — CBG MONITORING, ED
GLUCOSE-CAPILLARY: 194 mg/dL — AB (ref 65–99)
GLUCOSE-CAPILLARY: 133 mg/dL — AB (ref 65–99)
GLUCOSE-CAPILLARY: 122 mg/dL — AB (ref 65–99)
GLUCOSE-CAPILLARY: 160 mg/dL — AB (ref 65–99)
GLUCOSE-CAPILLARY: 237 mg/dL — AB (ref 65–99)
GLUCOSE-CAPILLARY: 261 mg/dL — AB (ref 65–99)
Glucose-Capillary: 120 mg/dL — ABNORMAL HIGH (ref 65–99)
Glucose-Capillary: 129 mg/dL — ABNORMAL HIGH (ref 65–99)
Glucose-Capillary: 159 mg/dL — ABNORMAL HIGH (ref 65–99)
Glucose-Capillary: 164 mg/dL — ABNORMAL HIGH (ref 65–99)
Glucose-Capillary: 195 mg/dL — ABNORMAL HIGH (ref 65–99)

## 2015-04-09 LAB — RENAL FUNCTION PANEL
ALBUMIN: 3.3 g/dL — AB (ref 3.5–5.0)
ALBUMIN: 3.1 g/dL — AB (ref 3.5–5.0)
ANION GAP: 9 (ref 5–15)
ANION GAP: 8 (ref 5–15)
Albumin: 3.2 g/dL — ABNORMAL LOW (ref 3.5–5.0)
Albumin: 3 g/dL — ABNORMAL LOW (ref 3.5–5.0)
Anion gap: 10 (ref 5–15)
Anion gap: 8 (ref 5–15)
BUN: 49 mg/dL — ABNORMAL HIGH (ref 6–20)
BUN: 45 mg/dL — ABNORMAL HIGH (ref 6–20)
BUN: 46 mg/dL — AB (ref 6–20)
BUN: 45 mg/dL — ABNORMAL HIGH (ref 6–20)
CALCIUM: 8.2 mg/dL — AB (ref 8.9–10.3)
CHLORIDE: 95 mmol/L — AB (ref 101–111)
CHLORIDE: 96 mmol/L — AB (ref 101–111)
CHLORIDE: 96 mmol/L — AB (ref 101–111)
CO2: 30 mmol/L (ref 22–32)
CO2: 29 mmol/L (ref 22–32)
CO2: 30 mmol/L (ref 22–32)
CO2: 30 mmol/L (ref 22–32)
CREATININE: 2.47 mg/dL — AB (ref 0.61–1.24)
CREATININE: 2.61 mg/dL — AB (ref 0.61–1.24)
Calcium: 8.2 mg/dL — ABNORMAL LOW (ref 8.9–10.3)
Calcium: 8.2 mg/dL — ABNORMAL LOW (ref 8.9–10.3)
Calcium: 8.4 mg/dL — ABNORMAL LOW (ref 8.9–10.3)
Chloride: 98 mmol/L — ABNORMAL LOW (ref 101–111)
Creatinine, Ser: 2.58 mg/dL — ABNORMAL HIGH (ref 0.61–1.24)
Creatinine, Ser: 2.58 mg/dL — ABNORMAL HIGH (ref 0.61–1.24)
GFR calc Af Amer: 31 mL/min — ABNORMAL LOW (ref >60)
GFR calc non Af Amer: 28 mL/min — ABNORMAL LOW (ref >60)
GFR calc non Af Amer: 26 mL/min — ABNORMAL LOW (ref >60)
GFR calc non Af Amer: 27 mL/min — ABNORMAL LOW (ref >60)
GFR calc non Af Amer: 27 mL/min — ABNORMAL LOW (ref >60)
GFR, EST AFRICAN AMERICAN: 33 mL/min — AB (ref >60)
GFR, EST AFRICAN AMERICAN: 31 mL/min — AB (ref >60)
GFR, EST AFRICAN AMERICAN: 31 mL/min — AB (ref >60)
GLUCOSE: 312 mg/dL — AB (ref 65–99)
Glucose, Bld: 145 mg/dL — ABNORMAL HIGH (ref 65–99)
Glucose, Bld: 220 mg/dL — ABNORMAL HIGH (ref 65–99)
Glucose, Bld: 363 mg/dL — ABNORMAL HIGH (ref 65–99)
PHOSPHORUS: 3.6 mg/dL (ref 2.5–4.6)
PHOSPHORUS: 4.3 mg/dL (ref 2.5–4.6)
POTASSIUM: 4.2 mmol/L (ref 3.5–5.1)
POTASSIUM: 4.2 mmol/L (ref 3.5–5.1)
Phosphorus: 3.4 mg/dL (ref 2.5–4.6)
Phosphorus: 2.4 mg/dL — ABNORMAL LOW (ref 2.5–4.6)
Potassium: 3.9 mmol/L (ref 3.5–5.1)
Potassium: 4.4 mmol/L (ref 3.5–5.1)
SODIUM: 137 mmol/L (ref 135–145)
Sodium: 134 mmol/L — ABNORMAL LOW (ref 135–145)
Sodium: 134 mmol/L — ABNORMAL LOW (ref 135–145)
Sodium: 134 mmol/L — ABNORMAL LOW (ref 135–145)

## 2015-04-09 LAB — PROTIME-INR
INR: 0.95 (ref 0.00–1.49)
PROTHROMBIN TIME: 12.9 s (ref 11.6–15.2)

## 2015-04-09 LAB — INFLUENZA PANEL BY PCR (TYPE A & B)
H1N1FLUPCR: NOT DETECTED
Influenza A By PCR: NEGATIVE
Influenza B By PCR: NEGATIVE

## 2015-04-09 LAB — APTT: aPTT: 26 seconds (ref 24–37)

## 2015-04-09 LAB — FIBRINOGEN: Fibrinogen: 441 mg/dL (ref 204–475)

## 2015-04-09 LAB — CORTISOL: Cortisol, Plasma: 14.9 ug/dL

## 2015-04-09 LAB — GLUCOSE, CAPILLARY
Glucose-Capillary: 344 mg/dL — ABNORMAL HIGH (ref 65–99)
Glucose-Capillary: 238 mg/dL — ABNORMAL HIGH (ref 65–99)

## 2015-04-09 LAB — STREP PNEUMONIAE URINARY ANTIGEN: STREP PNEUMO URINARY ANTIGEN: NEGATIVE

## 2015-04-09 LAB — LACTIC ACID, PLASMA: Lactic Acid, Venous: 1.1 mmol/L (ref 0.5–2.0)

## 2015-04-09 LAB — MRSA PCR SCREENING: MRSA by PCR: NEGATIVE

## 2015-04-09 MED ORDER — DM-GUAIFENESIN ER 30-600 MG PO TB12
1.0000 | ORAL_TABLET | Freq: Two times a day (BID) | ORAL | Status: DC
Start: 2015-04-09 — End: 2015-04-12
  Administered 2015-04-09 – 2015-04-12 (×6): 1 via ORAL
  Filled 2015-04-09 (×8): qty 1

## 2015-04-09 MED ORDER — SODIUM CHLORIDE 0.9 % IV SOLN
INTRAVENOUS | Status: DC
  Administered 2015-04-09 – 2015-04-10 (×3): via INTRAVENOUS

## 2015-04-09 MED ORDER — POTASSIUM CHLORIDE 10 MEQ/100ML IV SOLN
INTRAVENOUS | Status: AC
  Administered 2015-04-09: 10 meq
  Filled 2015-04-09: qty 100

## 2015-04-09 MED ORDER — INSULIN ASPART 100 UNIT/ML ~~LOC~~ SOLN
0.0000 [IU] | Freq: Three times a day (TID) | SUBCUTANEOUS | Status: DC
  Administered 2015-04-09: 11 [IU] via SUBCUTANEOUS
  Administered 2015-04-09: 8 [IU] via SUBCUTANEOUS
  Administered 2015-04-09: 2 [IU] via SUBCUTANEOUS
  Administered 2015-04-10: 8 [IU] via SUBCUTANEOUS
  Administered 2015-04-10: 5 [IU] via SUBCUTANEOUS
  Administered 2015-04-10: 8 [IU] via SUBCUTANEOUS
  Administered 2015-04-11: 5 [IU] via SUBCUTANEOUS
  Administered 2015-04-11: 11 [IU] via SUBCUTANEOUS
  Administered 2015-04-11: 8 [IU] via SUBCUTANEOUS
  Administered 2015-04-12 (×2): 5 [IU] via SUBCUTANEOUS
  Filled 2015-04-09 (×2): qty 1

## 2015-04-09 MED ORDER — HEPARIN SODIUM (PORCINE) 5000 UNIT/ML IJ SOLN
5000.0000 [IU] | Freq: Three times a day (TID) | INTRAMUSCULAR | Status: DC
  Administered 2015-04-09 – 2015-04-12 (×9): 5000 [IU] via SUBCUTANEOUS
  Filled 2015-04-09 (×12): qty 1

## 2015-04-09 MED ORDER — INSULIN ASPART 100 UNIT/ML ~~LOC~~ SOLN
0.0000 [IU] | Freq: Every day | SUBCUTANEOUS | Status: DC
  Administered 2015-04-09 – 2015-04-10 (×2): 2 [IU] via SUBCUTANEOUS
  Administered 2015-04-11: 3 [IU] via SUBCUTANEOUS

## 2015-04-09 MED ORDER — BENZONATATE 100 MG PO CAPS: 100.0000 mg | ORAL_CAPSULE | Freq: Three times a day (TID) | ORAL | Status: DC | PRN

## 2015-04-09 MED ORDER — INSULIN GLARGINE 100 UNIT/ML ~~LOC~~ SOLN
50.0000 [IU] | SUBCUTANEOUS | Status: DC
  Administered 2015-04-09 – 2015-04-10 (×2): 50 [IU] via SUBCUTANEOUS
  Filled 2015-04-09 (×3): qty 0.5

## 2015-04-09 NOTE — Progress Notes (Addendum)
[facility] Progress Note  Patient: Timothy Le [id number]   PCP: Kerin Perna, NP DOB: 1960/12/27   DOA: 04/08/2015   DOS: 04/09/2015   Date of Service: the patient was seen and examined on 04/09/2015  Subjective: Patient Is a cough this morning. Denies having any complaint of chest pain or abdominal pain. No nausea no vomiting and no diarrhea or constipation. Nutrition: Tolerating oral diet  Brief hospital course: Patient was admitted on 04/08/2015, with complaint of nausea vomiting as well as high blood sugar, was found to have DKA with hypovolemia. Patient was treated aggressively with IV hydration. Possibility of sepsis cannot be ruled out and therefore he was given IV antibiotics. Anion gap has been closed and now the patient has been transitioned to subcutaneous insulin. Currently further plan is continue aggressive IV hydration due to persistent hypotension.  Assessment and Plan: 1. Hypovolemic shock (HCC) Acute on chronic kidney disease. Suspected sepsis with healthcare associated pneumonia  Patient's blood pressure has been ranging in the 80s now Patient has been given aggressive IV fluid bolus and will continue current infusion rate. Although lactic acid is not significantly elevated. Given his body habitus it has been difficult to evaluate the patient as well as make an accurate reading of the blood pressure. Patient will be continually monitored in the stepdown unit. Vancomycin and Zosyn has been initiated for an unidentified infection. Patient will be also started on DKA protocol with BMP every 4 hours. Critical care has been requested to monitor the patient electronically. As the patient is a patient's renal function appears to be remaining stable and the patient clinically continues to remain nontoxic we'll continue with IV fluids.  2. DKA. Type 2 diabetes mellitus, uncontrolled, recent hemoglobin A1c in 04/08/15 11% Anion gap has been closed. The  patient has been transitioned to subcutaneous insulin. Next and patient received IV insulin until the afternoon.  3. Obstructive sleep apnea. Continue Cpap.  4. Hyponatremia. Due to hyperglycemia pseudohyponatremia. Continue monitoring.  Activity: physical therapy will be ordered Bowel regimen: last BM prior to arrival DVT Prophylaxis: subcutaneous Heparin Nutrition: Tolerating oral diet carb modified heart healthy Advance goals of care discussion: Full code  HPI: As per the H and P dictated on admission, "Timothy Le is a 55 y.o. male with Past medical history of type 2 diabetes mellitus uncontrolled, COPD, sleep apnea, morbid obesity, hypertension. The patient presents with complains of high blood glucose. Patient mentions that he was checking his blood glucose at home and this was 500. He has been having nausea and vomiting since last 3 days. He has loose watery bowel movement 21 this morning. He denies having any complaints of chest pain or abdominal pain. Continues to complain of cough as well as shortness of breath. No fever no juices been reported. Appears to be lethargic. Mentions that he is compliant with all his medications. Denies any drug abuse. Also denies having any focal deficit" Procedures: None Consultants: None Antibiotics: Anti-infectives    Start     Dose/Rate Route Frequency Ordered Stop   04/08/15 2000  vancomycin (VANCOCIN) 2,000 mg in sodium chloride 0.9 % 500 mL IVPB     2,000 mg 250 mL/hr over 120 Minutes Intravenous Every 48 hours 04/08/15 1822     04/08/15 1830  piperacillin-tazobactam (ZOSYN) IVPB 3.375 g     3.375 g 12.5 mL/hr over 240 Minutes Intravenous Every 8 hours 04/08/15 1822     04/08/15 1730  piperacillin-tazobactam (ZOSYN) IVPB 3.375 g  Status:  Discontinued     3.375 g 100 mL/hr over 30 Minutes Intravenous  Once 04/08/15 1722 04/08/15 1727   04/08/15 1730  vancomycin (VANCOCIN) IVPB 1000 mg/200 mL premix  Status:  Discontinued       1,000 mg 200 mL/hr over 60 Minutes Intravenous  Once 04/08/15 1722 04/08/15 1727   04/08/15 1730  piperacillin-tazobactam (ZOSYN) IVPB 3.375 g  Status:  Discontinued     3.375 g 100 mL/hr over 30 Minutes Intravenous  Once 04/08/15 1728 04/08/15 1822   04/08/15 1730  vancomycin (VANCOCIN) IVPB 1000 mg/200 mL premix  Status:  Discontinued     1,000 mg 200 mL/hr over 60 Minutes Intravenous  Once 04/08/15 1728 04/08/15 1822       Family Communication: no family was present at bedside, at the time of interview.   Disposition:  Expected discharge date: 04/11/2015 Barriers to safe discharge: Improvement in blood pressure as well as renal function   Intake/Output Summary (Last 24 hours) at 04/09/15 1704 Last data filed at 04/09/15 0622  Gross per 24 hour  Intake      0 ml  Output    400 ml  Net   -400 ml   Filed Weights   04/09/15 1630  Weight: 173 kg (381 lb 6.3 oz)    Objective: Physical Exam: Filed Vitals:   04/09/15 1430 04/09/15 1500 04/09/15 1630 04/09/15 1653  BP: 97/68 91/62 95/58    Pulse: 83 76    Temp:   98.3 F (36.8 C) 98.2 F (36.8 C)  TempSrc:   Oral   Resp:   20   Height:   5\' 8"  (1.727 m)   Weight:   173 kg (381 lb 6.3 oz)   SpO2: 98% 94% 96%      General: Appear in mild distress, no Rash; Oral Mucosa moist. Cardiovascular: S1 and S2 Present, no Murmur Respiratory: Bilateral Air entry present and left basal Crackles, no wheezes Abdomen: Bowel Sound present, Soft and no tenderness Extremities: no Pedal edema, no calf tenderness Neurology: Grossly no focal neuro deficit.  Data Reviewed: CBC:  Recent Labs Lab 04/08/15 1531  WBC 6.9  HGB 14.6  HCT 46.0  MCV 92.6  PLT 99991111   Basic Metabolic Panel:  Recent Labs Lab 04/08/15 1531 04/08/15 2200 04/09/15 0422 04/09/15 1131 04/09/15 1311  NA 129*  126* 133* 137 134* 134*  K 4.9  4.8 5.0 3.9 4.4 4.2  CL 85*  83* 95* 98* 95* 96*  CO2 27  30 27 30 29 30   GLUCOSE 603*  590* 323* 145* 220*  312*  BUN 64*  62* 55* 49* 45* 46*  CREATININE 3.16*  3.05* 2.81* 2.47* 2.61* 2.58*  CALCIUM 9.5  9.4 8.5* 8.2* 8.2* 8.2*  PHOS 2.6 2.8 3.6 4.3 3.4   Liver Function Tests:  Recent Labs Lab 04/08/15 1531 04/08/15 2200 04/09/15 0422 04/09/15 1131 04/09/15 1311  AST 26  --   --   --   --   ALT 27  --   --   --   --   ALKPHOS 98  --   --   --   --   BILITOT 1.0  --   --   --   --   PROT 8.1  --   --   --   --   ALBUMIN 3.9  3.9 3.6 3.3* 3.2* 3.1*   No results for input(s): LIPASE, AMYLASE in the last 168 hours. No results for input(s): AMMONIA in the  last 168 hours.  Cardiac Enzymes: No results for input(s): CKTOTAL, CKMB, CKMBINDEX, TROPONINI in the last 168 hours.  BNP (last 3 results)  Recent Labs  12/27/14 1659 02/07/15 1540 03/27/15 1624  BNP 27.9 26.9 9.8    CBG:  Recent Labs Lab 04/09/15 0753 04/09/15 0909 04/09/15 1047 04/09/15 1152 04/09/15 1306  GLUCAP 159* 164* 160* 237* 261*    No results found for this or any previous visit (from the past 240 hour(s)).   Studies: Dg Chest 2 View  04/09/2015  CLINICAL DATA:  Pt complains of sob and productive cough. Hx of asthma, HTN, diabetes, and COPD. EXAM: CHEST  2 VIEW COMPARISON:  04/08/2015 FINDINGS: The study is limited by the patient's body habitus. Is also limited by inspiratory effect limitation. There is mild cardiac enlargement. The vascular pattern is normal. Mild bibasilar opacity likely reflects hypoventilatory change. IMPRESSION: Probable bibasilar atelectasis although study is limited as described above and it is difficult to exclude pneumonia. Electronically Signed   By: Skipper Cliche M.D.   On: 04/09/2015 15:57     Scheduled Meds: . dextromethorphan-guaiFENesin  1 tablet Oral BID  . heparin  5,000 Units Subcutaneous 3 times per day  . insulin aspart  0-15 Units Subcutaneous TID WC  . insulin aspart  0-5 Units Subcutaneous QHS  . insulin glargine  50 Units Subcutaneous Q24H  .  ipratropium-albuterol  3 mL Nebulization TID  . piperacillin-tazobactam (ZOSYN)  IV  3.375 g Intravenous Q8H  . sodium chloride flush  3 mL Intravenous Q12H  . vancomycin  2,000 mg Intravenous Q48H   Continuous Infusions: . sodium chloride 125 mL/hr at 04/09/15 1652   PRN Meds: acetaminophen **OR** acetaminophen, albuterol, benzonatate, ondansetron **OR** ondansetron (ZOFRAN) IV  Time spent: 30 minutes  Author: Berle Mull, MD [facility] Pager: [phone] 04/09/2015 5:04 PM  If 7PM-7AM, please contact night-coverage at [email or web address], password [facility]

## 2015-04-09 NOTE — Progress Notes (Addendum)
48 VM for CM supervisor about [facility]RI-pt with 4 admissions in the last 6 months  1609 consulted with [facility] about [place] after pt voiced interest in a [facility] to assist with his DM   1603 Spoke with [facility] staff who is checking to see if pt address within their delivery area and will return call to CM mobile #

## 2015-04-09 NOTE — Progress Notes (Addendum)
Inpatient Diabetes Program Recommendations  AACE/ADA: New Consensus Statement on Inpatient Glycemic Control (2015)  Target Ranges:  Prepandial:   less than 140 mg/dL      Peak postprandial:   less than 180 mg/dL (1-2 hours)      Critically ill patients:  140 - 180 mg/dL   Results for Timothy Le, Timothy Le (MRN [id number]) as of 04/09/2015 13:27  Ref. Range 04/08/2015 15:31  Sodium Latest Ref Range: 135-145 mmol/L 126 (L)  Potassium Latest Ref Range: 3.5-5.1 mmol/L 4.8  Chloride Latest Ref Range: 101-111 mmol/L 83 (L)  CO2 Latest Ref Range: 22-32 mmol/L 30  BUN Latest Ref Range: 6-20 mg/dL 62 (H)  Creatinine Latest Ref Range: 0.61-1.24 mg/dL 3.05 (H)  Calcium Latest Ref Range: 8.9-10.3 mg/dL 9.4  EGFR (Non-African Amer.) Latest Ref Range: >60 mL/min 22 (L)  EGFR (African American) Latest Ref Range: >60 mL/min 25 (L)  Glucose Latest Ref Range: 65-99 mg/dL 590 (HH)  Anion gap Latest Ref Range: 5-15  13    Admit with: Hyperglycemia/ Possible Sepsis  History: DM2, COPD  Home DM Meds: Lantus 50 units QHS  Current Insulin Orders: Lantus 50 units daily        Novolog Moderate Correction Scale/ SSI (0-15 units) TID AC + HS        IV Insulin drip     -Patient seen and counseled by DM Coordinator at length on 03/28/15 during last admission.  Was started on Lantus 50 units daily for home use during that admission.  -Admitted with Glucose of 590 mg/dl this admission.  -Currently getting IV insulin drip + SQ insulin regimen.  Note Insulin drip to stop now per RN notes (1:30pm).    --Will follow patient during hospitalization--  Wyn Quaker RN, MSN, CDE Diabetes Coordinator Inpatient Glycemic Control Team Team Pager: [phone] (8a-5p)

## 2015-04-09 NOTE — Progress Notes (Signed)
Pt states he had not made hospital f/u appt since last d/c to pcp  States he is interested in delivery of medications Has trouble getting them at home

## 2015-04-09 NOTE — ED Notes (Signed)
Stop insulin drip per MD

## 2015-04-09 NOTE — ED Notes (Signed)
MD at bedside. 

## 2015-04-09 NOTE — Progress Notes (Addendum)
Return call from [place] at [place] to assist with getting pt set up for delivery services Delivery will be every Thursday as needed a 1 pm in his area The cost of delivery will be $3 The cost will be $3.20 if pt would need any OTC items Pt called in rm [phone] and he agreed to all services and for permission for CM to review his medications with Morey [facility] Provided [place] at [facility] for [place] to call to get needed information for pt transfer of medication CM will need to call [facility] upon d/c with any further changes at d/c    [facility] for delivery [facility], [address], [place], [place] [address] Hours: Open today  8AM-8PM Phone: [phone]  Entered in d/c instructions [place] Call As needed Delivery in your area occurs every Thursday at 1 pm Delivery cost will be $3 or $3.20 if you need any over the counter medications needed [address],  [place], [place] [address]  Hours: Open today  8AM-8PM Phone [phone]

## 2015-04-09 NOTE — Clinical Documentation Improvement (Signed)
Hospitalist  (Please document query responses in the current medical record, not on the CDI BPA form.  Thank you.)  Please document if a condition below provides greater specificity regarding the patient's "Acute on chronic kidney disease."  - Acute Kidney injury (AKI)   - other condition  - unable to clinically determine  Clinical information/indicators: "Acute on chronic kidney disease" documented in the H&P I would continue hydrate him with IV fluids, will give him bolus as per 58m/kg per sepsis protocol. BUN/Cr/GFR     (black male) Component     Latest Ref Rng 04/08/2015 04/08/2015 04/08/2015 04/09/2015         3:31 PM  3:31 PM 10:00 PM  4:22 AM  BUN     6 - 20 mg/dL 64 (H) 62 (H) 55 (H) 49 (H)  Creatinine     0.61 - 1.24 mg/dL 3.16 (H) 3.05 (H) 2.81 (H) 2.47 (H)  EGFR (African American)     >60 mL/min 24 (L) 25 (L) 28 (L) 33 (L)   Component     Latest Ref Rng 04/09/2015 04/09/2015 04/09/2015        11:31 AM  1:11 PM  5:37 PM  BUN     6 - 20 mg/dL 45 (H) 46 (H) 45 (H)  Creatinine     0.61 - 1.24 mg/dL 2.61 (H) 2.58 (H) 2.58 (H)  EGFR (African American)     >60 mL/min 31 (L) 31 (L) 31 (L)   Please exercise your independent, professional judgment when responding. A specific answer is not anticipated or expected.   Thank You, CErling Conte RN BSN CCDS [phone] Information Management [place]

## 2015-04-09 NOTE — ED Notes (Signed)
Patient voided, patient was then assisted with repositioning in bed.

## 2015-04-09 NOTE — ED Notes (Signed)
Patient resting quietly, eyes closed, chest observed for rise and fall. Patient is easily aroused for nursing tasks.

## 2015-04-09 NOTE — ED Notes (Signed)
Pt doesn't have good veins.  Talked to [facility] - will come put an [place] IV in so we can use that for his blood draws throughout the day.  ( has orders every 4 hours )

## 2015-04-09 NOTE — ED Notes (Signed)
Writer called Main lab for am blood draws.

## 2015-04-09 NOTE — ED Notes (Signed)
Pt is resting comfortably in bed with equal chest rise and fall noted. No signs of acute distress noted.

## 2015-04-09 NOTE — Progress Notes (Signed)
Patient refuses CPAP. Patient states that he does not and will not wear one at home. RT will continue to monitor

## 2015-04-10 ENCOUNTER — Inpatient Hospital Stay (HOSPITAL_COMMUNITY): Payer: Medicaid Other

## 2015-04-10 DIAGNOSIS — Z794 Long term (current) use of insulin: Secondary | ICD-10-CM

## 2015-04-10 DIAGNOSIS — A419 Sepsis, unspecified organism: Principal | ICD-10-CM

## 2015-04-10 DIAGNOSIS — G4733 Obstructive sleep apnea (adult) (pediatric): Secondary | ICD-10-CM

## 2015-04-10 DIAGNOSIS — I509 Heart failure, unspecified: Secondary | ICD-10-CM

## 2015-04-10 DIAGNOSIS — E871 Hypo-osmolality and hyponatremia: Secondary | ICD-10-CM

## 2015-04-10 DIAGNOSIS — E1310 Other specified diabetes mellitus with ketoacidosis without coma: Secondary | ICD-10-CM

## 2015-04-10 LAB — RENAL FUNCTION PANEL
ALBUMIN: 3.1 g/dL — AB (ref 3.5–5.0)
ANION GAP: 10 (ref 5–15)
BUN: 36 mg/dL — ABNORMAL HIGH (ref 6–20)
CALCIUM: 8.5 mg/dL — AB (ref 8.9–10.3)
CO2: 27 mmol/L (ref 22–32)
Chloride: 98 mmol/L — ABNORMAL LOW (ref 101–111)
Creatinine, Ser: 2.34 mg/dL — ABNORMAL HIGH (ref 0.61–1.24)
GFR, EST AFRICAN AMERICAN: 35 mL/min — AB (ref >60)
GFR, EST NON AFRICAN AMERICAN: 30 mL/min — AB (ref >60)
Glucose, Bld: 135 mg/dL — ABNORMAL HIGH (ref 65–99)
PHOSPHORUS: 3.7 mg/dL (ref 2.5–4.6)
Potassium: 5.2 mmol/L — ABNORMAL HIGH (ref 3.5–5.1)
SODIUM: 135 mmol/L (ref 135–145)

## 2015-04-10 LAB — CBC WITH DIFFERENTIAL/PLATELET
BASOS ABS: 0.1 10*3/uL (ref 0.0–0.1)
Basophils Relative: 1 %
EOS ABS: 0.3 10*3/uL (ref 0.0–0.7)
Eosinophils Relative: 5 %
HCT: 38.4 % — ABNORMAL LOW (ref 39.0–52.0)
HEMOGLOBIN: 12.4 g/dL — AB (ref 13.0–17.0)
LYMPHS PCT: 38 %
Lymphs Abs: 2.2 10*3/uL (ref 0.7–4.0)
MCH: 29.2 pg (ref 26.0–34.0)
MCHC: 32.3 g/dL (ref 30.0–36.0)
MCV: 90.4 fL (ref 78.0–100.0)
Monocytes Absolute: 0.7 10*3/uL (ref 0.1–1.0)
Monocytes Relative: 12 %
NEUTROS PCT: 44 %
Neutro Abs: 2.5 10*3/uL (ref 1.7–7.7)
PLATELETS: 221 10*3/uL (ref 150–400)
RBC: 4.25 MIL/uL (ref 4.22–5.81)
RDW: 16.3 % — ABNORMAL HIGH (ref 11.5–15.5)
WBC: 5.8 10*3/uL (ref 4.0–10.5)

## 2015-04-10 LAB — LEGIONELLA PNEUMOPHILA SEROGP 1 UR AG: L. PNEUMOPHILA SEROGP 1 UR AG: NEGATIVE

## 2015-04-10 LAB — ECHOCARDIOGRAM COMPLETE
HEIGHTINCHES: 68 in
WEIGHTICAEL: 5984 [oz_av]

## 2015-04-10 LAB — GLUCOSE, CAPILLARY
GLUCOSE-CAPILLARY: 236 mg/dL — AB (ref 65–99)
GLUCOSE-CAPILLARY: 289 mg/dL — AB (ref 65–99)
Glucose-Capillary: 256 mg/dL — ABNORMAL HIGH (ref 65–99)
Glucose-Capillary: 246 mg/dL — ABNORMAL HIGH (ref 65–99)

## 2015-04-10 MED ORDER — INSULIN ASPART 100 UNIT/ML ~~LOC~~ SOLN
5.0000 [IU] | Freq: Three times a day (TID) | SUBCUTANEOUS | Status: DC
  Administered 2015-04-10 – 2015-04-12 (×6): 5 [IU] via SUBCUTANEOUS

## 2015-04-10 MED ORDER — SODIUM CHLORIDE 0.9 % IV SOLN
INTRAVENOUS | Status: DC
  Administered 2015-04-10: 21:00:00 via INTRAVENOUS

## 2015-04-10 MED ORDER — VANCOMYCIN HCL 10 G IV SOLR
1750.0000 mg | INTRAVENOUS | Status: DC
  Administered 2015-04-10: 1750 mg via INTRAVENOUS
  Filled 2015-04-10: qty 1750
  Filled 2015-04-10: qty 750

## 2015-04-10 MED ORDER — PERFLUTREN LIPID MICROSPHERE
1.0000 mL | INTRAVENOUS | Status: AC | PRN
  Administered 2015-04-10: 3 mL via INTRAVENOUS
  Filled 2015-04-10: qty 10

## 2015-04-10 MED ORDER — PERFLUTREN LIPID MICROSPHERE
INTRAVENOUS | Status: AC
  Filled 2015-04-10: qty 10

## 2015-04-10 NOTE — Progress Notes (Signed)
*  PRELIMINARY RESULTS* Echocardiogram 2D Echocardiogram has been performed.  Timothy Le 04/10/2015, 12:35 PM

## 2015-04-10 NOTE — Progress Notes (Addendum)
[facility] Progress Note  Patient: Timothy Le [id number]   PCP: Kerin Perna, NP DOB: 1960-05-24   DOA: 04/08/2015   DOS: 04/10/2015   Date of Service: the patient was seen and examined on 04/10/2015  Subjective: on Cpap this AM No complaints  Brief hospital course: Patient was admitted on 04/08/2015, with complaint of nausea vomiting as well as high blood sugar, was found to have DKA with hypovolemia. Patient was treated aggressively with IV hydration. Possibility of sepsis cannot be ruled out and therefore he was given IV antibiotics. Anion gap has been closed and now the patient has been transitioned to subcutaneous insulin. Currently further plan is continue aggressive IV hydration due to persistent hypotension.  Assessment and Plan: Hypovolemia AKI on chronic kidney disease stage III:  baseline around 1.2 Suspected sepsis with healthcare associated pneumonia Vancomycin and Zosyn has been initiated for an unidentified infection-- await cultures to result and plan to de-escalate abx to PO medications  DKA/Type 2 diabetes mellitus, uncontrolled, recent hemoglobin A1c in 04/08/15 11% Anion gap has been closed. The patient has been transitioned to subcutaneous insulin -SSI + with meal coverage   Obstructive sleep apnea. Continue Cpap QHS  Hyponatremia. Due to hyperglycemia pseudohyponatremia. resolved  Hyperkalemia -recheck in AM    DVT Prophylaxis: subcutaneous Heparin Nutrition: Tolerating oral diet carb modified heart healthy Advance goals of care discussion: Full code  Antibiotics: Anti-infectives    Start     Dose/Rate Route Frequency Ordered Stop   04/10/15 1000  vancomycin (VANCOCIN) 1,750 mg in sodium chloride 0.9 % 500 mL IVPB     1,750 mg 250 mL/hr over 120 Minutes Intravenous Every 24 hours 04/10/15 0932     04/08/15 2000  vancomycin (VANCOCIN) 2,000 mg in sodium chloride 0.9 % 500 mL IVPB  Status:  Discontinued     2,000 mg 250 mL/hr  over 120 Minutes Intravenous Every 48 hours 04/08/15 1822 04/10/15 0932   04/08/15 1830  piperacillin-tazobactam (ZOSYN) IVPB 3.375 g     3.375 g 12.5 mL/hr over 240 Minutes Intravenous Every 8 hours 04/08/15 1822     04/08/15 1730  piperacillin-tazobactam (ZOSYN) IVPB 3.375 g  Status:  Discontinued     3.375 g 100 mL/hr over 30 Minutes Intravenous  Once 04/08/15 1722 04/08/15 1727   04/08/15 1730  vancomycin (VANCOCIN) IVPB 1000 mg/200 mL premix  Status:  Discontinued     1,000 mg 200 mL/hr over 60 Minutes Intravenous  Once 04/08/15 1722 04/08/15 1727   04/08/15 1730  piperacillin-tazobactam (ZOSYN) IVPB 3.375 g  Status:  Discontinued     3.375 g 100 mL/hr over 30 Minutes Intravenous  Once 04/08/15 1728 04/08/15 1822   04/08/15 1730  vancomycin (VANCOCIN) IVPB 1000 mg/200 mL premix  Status:  Discontinued     1,000 mg 200 mL/hr over 60 Minutes Intravenous  Once 04/08/15 1728 04/08/15 1822       Family Communication: no family was present at bedside, at the time of interview.   Disposition:  Expected discharge date: 04/11/2015 Barriers to safe discharge: Improvement in blood pressure as well as renal function   Intake/Output Summary (Last 24 hours) at 04/10/15 1209 Last data filed at 04/10/15 1041  Gross per 24 hour  Intake 1266.67 ml  Output   4375 ml  Net -3108.33 ml   Filed Weights   04/09/15 1630 04/10/15 0500  Weight: 173 kg (381 lb 6.3 oz) 169.645 kg (374 lb)    Objective: Physical Exam: Filed Vitals:  04/10/15 0400 04/10/15 0500 04/10/15 0600 04/10/15 0800  BP: 136/38  131/59   Pulse:      Temp: 98.7 F (37.1 C)   98.3 F (36.8 C)  TempSrc: Oral     Resp: 25  15   Height:      Weight:  169.645 kg (374 lb)    SpO2: 90%  97%      General: Awake, NAD Cardiovascular: S1 and S2 Present, no Murmur Respiratory: Bilateral Air entry present and left basal Crackles, no wheezes Abdomen: Bowel Sound present, Soft and no tenderness Extremities: no Pedal edema, no  calf tenderness Neurology: Grossly no focal neuro deficit.  Data Reviewed: CBC:  Recent Labs Lab 04/08/15 1531 04/10/15 0319  WBC 6.9 5.8  NEUTROABS  --  2.5  HGB 14.6 12.4*  HCT 46.0 38.4*  MCV 92.6 90.4  PLT 257 [id number]   Basic Metabolic Panel:  Recent Labs Lab 04/09/15 0422 04/09/15 1131 04/09/15 1311 04/09/15 1737 04/10/15 0319  NA 137 134* 134* 134* 135  K 3.9 4.4 4.2 4.2 5.2*  CL 98* 95* 96* 96* 98*  CO2 30 29 30 30 27   GLUCOSE 145* 220* 312* 363* 135*  BUN 49* 45* 46* 45* 36*  CREATININE 2.47* 2.61* 2.58* 2.58* 2.34*  CALCIUM 8.2* 8.2* 8.2* 8.4* 8.5*  PHOS 3.6 4.3 3.4 2.4* 3.7   Liver Function Tests:  Recent Labs Lab 04/08/15 1531  04/09/15 0422 04/09/15 1131 04/09/15 1311 04/09/15 1737 04/10/15 0319  AST 26  --   --   --   --   --   --   ALT 27  --   --   --   --   --   --   ALKPHOS 98  --   --   --   --   --   --   BILITOT 1.0  --   --   --   --   --   --   PROT 8.1  --   --   --   --   --   --   ALBUMIN 3.9  3.9  < > 3.3* 3.2* 3.1* 3.0* 3.1*  < > = values in this interval not displayed. No results for input(s): LIPASE, AMYLASE in the last 168 hours. No results for input(s): AMMONIA in the last 168 hours.  Cardiac Enzymes: No results for input(s): CKTOTAL, CKMB, CKMBINDEX, TROPONINI in the last 168 hours.  BNP (last 3 results)  Recent Labs  12/27/14 1659 02/07/15 1540 03/27/15 1624  BNP 27.9 26.9 9.8    CBG:  Recent Labs Lab 04/09/15 1152 04/09/15 1306 04/09/15 1645 04/09/15 2118 04/10/15 0748  GLUCAP 237* 261* 344* 238* 236*    Recent Results (from the past 240 hour(s))  MRSA PCR Screening     Status: None   Collection Time: 04/09/15  4:46 PM  Result Value Ref Range Status   MRSA by PCR NEGATIVE NEGATIVE Final    Comment:        The GeneXpert MRSA Assay (FDA approved for NASAL specimens only), is one component of a comprehensive MRSA colonization surveillance program. It is not intended to diagnose MRSA infection nor  to guide or monitor treatment for MRSA infections.      Studies: Dg Chest 2 View  04/09/2015  CLINICAL DATA:  Pt complains of sob and productive cough. Hx of asthma, HTN, diabetes, and COPD. EXAM: CHEST  2 VIEW COMPARISON:  04/08/2015 FINDINGS: The study is limited by the  patient's body habitus. Is also limited by inspiratory effect limitation. There is mild cardiac enlargement. The vascular pattern is normal. Mild bibasilar opacity likely reflects hypoventilatory change. IMPRESSION: Probable bibasilar atelectasis although study is limited as described above and it is difficult to exclude pneumonia. Electronically Signed   By: Skipper Cliche M.D.   On: 04/09/2015 15:57     Scheduled Meds: . dextromethorphan-guaiFENesin  1 tablet Oral BID  . heparin  5,000 Units Subcutaneous 3 times per day  . insulin aspart  0-15 Units Subcutaneous TID WC  . insulin aspart  0-5 Units Subcutaneous QHS  . insulin glargine  50 Units Subcutaneous Q24H  . ipratropium-albuterol  3 mL Nebulization TID  . piperacillin-tazobactam (ZOSYN)  IV  3.375 g Intravenous Q8H  . sodium chloride flush  3 mL Intravenous Q12H  . vancomycin  1,750 mg Intravenous Q24H   Continuous Infusions: . sodium chloride 125 mL/hr at 04/10/15 0900   PRN Meds: acetaminophen **OR** acetaminophen, albuterol, benzonatate, ondansetron **OR** ondansetron (ZOFRAN) IV, perflutren lipid microspheres (DEFINITY) IV suspension  Time spent: 30 minutes  Author: Eulogio Bear DO [facility] Pager: [phone]  04/10/2015 12:09 PM  If 7PM-7AM, please contact night-coverage at [email or web address], password [facility]

## 2015-04-10 NOTE — Progress Notes (Signed)
Reviewed for HRI criteria pending

## 2015-04-10 NOTE — Progress Notes (Addendum)
Inpatient Diabetes Program Recommendations  AACE/ADA: New Consensus Statement on Inpatient Glycemic Control (2015)  Target Ranges:  Prepandial:   less than 140 mg/dL      Peak postprandial:   less than 180 mg/dL (1-2 hours)      Critically ill patients:  140 - 180 mg/dL   Results for RYOTT, CERVENY (MRN [id number]) as of 04/10/2015 08:49  Ref. Range 04/09/2015 11:52 04/09/2015 13:06 04/09/2015 16:45 04/09/2015 21:18  Glucose-Capillary Latest Ref Range: 65-99 mg/dL 237 (H) 261 (H) 344 (H) 238 (H)   Results for ESIAH, COFFELL (MRN [id number]) as of 04/10/2015 08:49  Ref. Range 04/10/2015 07:48  Glucose-Capillary Latest Ref Range: 65-99 mg/dL 236 (H)    Admit with: Hyperglycemia/ Possible Sepsis  History: DM2, COPD  Home DM Meds: Lantus 50 units QHS  Current Insulin Orders: Lantus 50 units daily  Novolog Moderate Correction Scale/ SSI (0-15 units) TID AC + HS       -Patient seen and counseled by DM Coordinator at length on 03/28/15 during last admission. Was started on Lantus 50 units daily for home use during that admission.  -Admitted with Glucose of 590 mg/dl this admission.     MD- Please consider the following in-hospital insulin adjustments:  1. Increase Lantus to 60 units daily (20% increase)- Fasting glucose still elevated this AM  2. Start Novolog Meal Coverage- Novolog 6 units tidwc (hold if pt NPO, hold it pt eats <50% of meal)  3. Patient may benefit from the addition of Novolog SSI to his home DM Medication regimen     --Will follow patient during hospitalization--  Wyn Quaker RN, MSN, CDE Diabetes Coordinator Inpatient Glycemic Control Team Team Pager: [phone] (8a-5p)

## 2015-04-10 NOTE — Progress Notes (Signed)
ED CM spoke with Abby of [facility] to provide [facility] referral info/order [facility]

## 2015-04-10 NOTE — Progress Notes (Signed)
Pharmacy Antibiotic Note  Timothy Le is a 55 y.o. male admitted on 04/08/2015 with sepsis.  Pharmacy has been consulted for Vancomycin & Zosyn dosing.  3/22: Renal fxn improved  Plan:  Change to Vancomycin 1750mg  IV q24h (using obese nomogram)  Continue Zosyn 3.375g IV q8h (infuse over 4 hours)  Monitor renal function and cx data   Duration of therapy per MD- de-escalate or discontinue abx as soon as clinically appropriate  Height: 5\' 8"  (172.7 cm) Weight: (!) 374 lb (169.645 kg) IBW/kg (Calculated) : 68.4  Temp (24hrs), Avg:98.2 F (36.8 C), Min:97.7 F (36.5 C), Max:98.7 F (37.1 C)   Recent Labs Lab 04/08/15 1531 04/08/15 2019  04/09/15 0422 04/09/15 1131 04/09/15 1311 04/09/15 1737 04/10/15 0319  WBC 6.9  --   --   --   --   --   --  5.8  CREATININE 3.16*  3.05*  --   < > 2.47* 2.61* 2.58* 2.58* 2.34*  LATICACIDVEN 1.7 1.5  --  1.1  --   --   --   --   < > = values in this interval not displayed.  Estimated Creatinine Clearance: 56.2 mL/min (by C-G formula based on Cr of 2.34).    No Known Allergies  Antimicrobials this admission: Zosyn 3/20  >>  Vanc 3/20 >>   Dose adjustments this admission: 3/22 Vanc 2g q48h --> 1750 q24h for improved renal fxn  Microbiology results: 3/20 BCx: sent 3/20 Influenza panel: neg 3/20 Urine strep/legionella: neg/IP 3/21 MRSA PCR negative  Thank you for allowing pharmacy to be a part of this patient's care.  Ralene Bathe, PharmD, BCPS 04/10/2015, 9:31 AM  Pager: [phone]

## 2015-04-10 NOTE — Progress Notes (Signed)
RT placed patient on CPAP after patient sats were in the 60's and 70's. Patient setting is auto 8-20 cmH2O. Patient is being compliant and is tolerating well. 6 liters oxygen bleed into tubing. RT will continue to monitor.

## 2015-04-10 NOTE — Progress Notes (Signed)
RT had to adjust CPAP setting and oxygen. Patient setting is now 12-20 cmH2 and oxygen is now on 15 Liters due to low sats. Oxygen is now 95 to 98 %. RT will continue to monitor.

## 2015-04-11 DIAGNOSIS — I10 Essential (primary) hypertension: Secondary | ICD-10-CM

## 2015-04-11 DIAGNOSIS — R571 Hypovolemic shock: Secondary | ICD-10-CM

## 2015-04-11 LAB — CBC
HEMATOCRIT: 41.9 % (ref 39.0–52.0)
HEMOGLOBIN: 12.8 g/dL — AB (ref 13.0–17.0)
MCH: 29.3 pg (ref 26.0–34.0)
MCHC: 30.5 g/dL (ref 30.0–36.0)
MCV: 95.9 fL (ref 78.0–100.0)
Platelets: 189 10*3/uL (ref 150–400)
RBC: 4.37 MIL/uL (ref 4.22–5.81)
RDW: 16.3 % — ABNORMAL HIGH (ref 11.5–15.5)
WBC: 4.5 10*3/uL (ref 4.0–10.5)

## 2015-04-11 LAB — GLUCOSE, CAPILLARY
GLUCOSE-CAPILLARY: 227 mg/dL — AB (ref 65–99)
GLUCOSE-CAPILLARY: 317 mg/dL — AB (ref 65–99)
GLUCOSE-CAPILLARY: 292 mg/dL — AB (ref 65–99)
Glucose-Capillary: 284 mg/dL — ABNORMAL HIGH (ref 65–99)

## 2015-04-11 LAB — RENAL FUNCTION PANEL
ALBUMIN: 3.3 g/dL — AB (ref 3.5–5.0)
ANION GAP: 8 (ref 5–15)
BUN: 21 mg/dL — ABNORMAL HIGH (ref 6–20)
CALCIUM: 9.2 mg/dL (ref 8.9–10.3)
CO2: 30 mmol/L (ref 22–32)
Chloride: 101 mmol/L (ref 101–111)
Creatinine, Ser: 1.69 mg/dL — ABNORMAL HIGH (ref 0.61–1.24)
GFR calc non Af Amer: 45 mL/min — ABNORMAL LOW (ref >60)
GFR, EST AFRICAN AMERICAN: 52 mL/min — AB (ref >60)
Glucose, Bld: 228 mg/dL — ABNORMAL HIGH (ref 65–99)
PHOSPHORUS: 1.9 mg/dL — AB (ref 2.5–4.6)
POTASSIUM: 4.3 mmol/L (ref 3.5–5.1)
SODIUM: 139 mmol/L (ref 135–145)

## 2015-04-11 MED ORDER — LEVOFLOXACIN 750 MG PO TABS
750.0000 mg | ORAL_TABLET | Freq: Every day | ORAL | Status: DC
  Administered 2015-04-11 – 2015-04-12 (×2): 750 mg via ORAL
  Filled 2015-04-11 (×2): qty 1

## 2015-04-11 MED ORDER — INSULIN GLARGINE 100 UNIT/ML ~~LOC~~ SOLN
55.0000 [IU] | SUBCUTANEOUS | Status: DC
  Administered 2015-04-11: 55 [IU] via SUBCUTANEOUS
  Filled 2015-04-11 (×2): qty 0.55

## 2015-04-11 NOTE — Progress Notes (Signed)
[facility] Progress Note  Patient: Timothy Le [id number]   PCP: Kerin Perna, NP DOB: [birth year]   DOA: 04/08/2015   DOS: 04/11/2015   Date of Service: the patient was seen and examined on 04/11/2015  Subjective:  Slept well -has not been using CPAP at home but plans to resume at d/c -does not know what makes up a diabetic diet  Brief hospital course: Patient was admitted on 04/08/2015, with complaint of nausea vomiting as well as high blood sugar, was found to have DKA with hypovolemia. Patient was treated aggressively with IV hydration. Possibility of sepsis cannot be ruled out and therefore he was given IV antibiotics. Anion gap has been closed and now the patient has been transitioned to subcutaneous insulin. Currently further plan is continue aggressive IV hydration due to persistent hypotension.  Assessment and Plan: Hypovolemia AKI on chronic kidney disease stage III:  baseline around 1.2 Suspected sepsis with healthcare associated pneumonia Vancomycin and Zosyn has been initiated for an unidentified infection-- cultures negative, de-escalate abx to PO medications  DKA/Type 2 diabetes mellitus, uncontrolled, recent hemoglobin A1c in 04/08/15 11% Anion gap has been closed. The patient has been transitioned to subcutaneous insulin -SSI + with meal coverage   Obstructive sleep apnea. Continue Cpap QHS  Hyponatremia. Due to hyperglycemia pseudohyponatremia. resolved  Hyperkalemia -recheck in AM    DVT Prophylaxis: subcutaneous Heparin Nutrition: Tolerating oral diet carb modified heart healthy Advance goals of care discussion: Full code  Antibiotics: Anti-infectives    Start     Dose/Rate Route Frequency Ordered Stop   04/11/15 1000  levofloxacin (LEVAQUIN) tablet 750 mg     750 mg Oral Daily 04/11/15 0818     04/10/15 1000  vancomycin (VANCOCIN) 1,750 mg in sodium chloride 0.9 % 500 mL IVPB  Status:  Discontinued     1,750 mg 250 mL/hr over 120  Minutes Intravenous Every 24 hours 04/10/15 0932 04/11/15 0804   04/08/15 2000  vancomycin (VANCOCIN) 2,000 mg in sodium chloride 0.9 % 500 mL IVPB  Status:  Discontinued     2,000 mg 250 mL/hr over 120 Minutes Intravenous Every 48 hours 04/08/15 1822 04/10/15 0932   04/08/15 1830  piperacillin-tazobactam (ZOSYN) IVPB 3.375 g  Status:  Discontinued     3.375 g 12.5 mL/hr over 240 Minutes Intravenous Every 8 hours 04/08/15 1822 04/11/15 0804   04/08/15 1730  piperacillin-tazobactam (ZOSYN) IVPB 3.375 g  Status:  Discontinued     3.375 g 100 mL/hr over 30 Minutes Intravenous  Once 04/08/15 1722 04/08/15 1727   04/08/15 1730  vancomycin (VANCOCIN) IVPB 1000 mg/200 mL premix  Status:  Discontinued     1,000 mg 200 mL/hr over 60 Minutes Intravenous  Once 04/08/15 1722 04/08/15 1727   04/08/15 1730  piperacillin-tazobactam (ZOSYN) IVPB 3.375 g  Status:  Discontinued     3.375 g 100 mL/hr over 30 Minutes Intravenous  Once 04/08/15 1728 04/08/15 1822   04/08/15 1730  vancomycin (VANCOCIN) IVPB 1000 mg/200 mL premix  Status:  Discontinued     1,000 mg 200 mL/hr over 60 Minutes Intravenous  Once 04/08/15 1728 04/08/15 1822       Family Communication: no family was present at bedside, at the time of interview.   Disposition:  Expected discharge date: 04/12/2015 With home health   Intake/Output Summary (Last 24 hours) at 04/11/15 1141 Last data filed at 04/11/15 0851  Gross per 24 hour  Intake    480 ml  Output   3125  ml  Net  -2645 ml   Filed Weights   04/09/15 1630 04/10/15 0500 04/11/15 0500  Weight: 173 kg (381 lb 6.3 oz) 169.645 kg (374 lb) 171.006 kg (377 lb)    Objective: Physical Exam: Filed Vitals:   04/10/15 2052 04/11/15 0500 04/11/15 0537 04/11/15 0820  BP: 101/59  138/71   Pulse: 86  79   Temp: 98.8 F (37.1 C)  98.9 F (37.2 C)   TempSrc: Oral  Oral   Resp: 20  16   Height:      Weight:  171.006 kg (377 lb)    SpO2: 96%  99% 95%     General: Awake,  NAD Cardiovascular: S1 and S2 Present, no Murmur Respiratory: Bilateral Air entry present and left basal Crackles, no wheezes Abdomen: Bowel Sound present, Soft and no tenderness Extremities: no Pedal edema, no calf tenderness Neurology: Grossly no focal neuro deficit.  Data Reviewed: CBC:  Recent Labs Lab 04/08/15 1531 04/10/15 0319 04/11/15 0800  WBC 6.9 5.8 4.5  NEUTROABS  --  2.5  --   HGB 14.6 12.4* 12.8*  HCT 46.0 38.4* 41.9  MCV 92.6 90.4 95.9  PLT 257 221 [id number]   Basic Metabolic Panel:  Recent Labs Lab 04/09/15 1131 04/09/15 1311 04/09/15 1737 04/10/15 0319 04/11/15 0800  NA 134* 134* 134* 135 139  K 4.4 4.2 4.2 5.2* 4.3  CL 95* 96* 96* 98* 101  CO2 29 30 30 27 30   GLUCOSE 220* 312* 363* 135* 228*  BUN 45* 46* 45* 36* 21*  CREATININE 2.61* 2.58* 2.58* 2.34* 1.69*  CALCIUM 8.2* 8.2* 8.4* 8.5* 9.2  PHOS 4.3 3.4 2.4* 3.7 1.9*   Liver Function Tests:  Recent Labs Lab 04/08/15 1531  04/09/15 1131 04/09/15 1311 04/09/15 1737 04/10/15 0319 04/11/15 0800  AST 26  --   --   --   --   --   --   ALT 27  --   --   --   --   --   --   ALKPHOS 98  --   --   --   --   --   --   BILITOT 1.0  --   --   --   --   --   --   PROT 8.1  --   --   --   --   --   --   ALBUMIN 3.9  3.9  < > 3.2* 3.1* 3.0* 3.1* 3.3*  < > = values in this interval not displayed. No results for input(s): LIPASE, AMYLASE in the last 168 hours. No results for input(s): AMMONIA in the last 168 hours.  Cardiac Enzymes: No results for input(s): CKTOTAL, CKMB, CKMBINDEX, TROPONINI in the last 168 hours.  BNP (last 3 results)  Recent Labs  12/27/14 1659 02/07/15 1540 03/27/15 1624  BNP 27.9 26.9 9.8    CBG:  Recent Labs Lab 04/10/15 0748 04/10/15 1153 04/10/15 1642 04/10/15 2049 04/11/15 0846  GLUCAP 236* 289* 256* 246* 227*    Recent Results (from the past 240 hour(s))  Culture, blood (x 2)     Status: None (Preliminary result)   Collection Time: 04/08/15  3:35 PM  Result  Value Ref Range Status   Specimen Description BLOOD RIGHT HAND  Final   Special Requests IN PEDIATRIC BOTTLE 4CC  Final   Culture   Final    NO GROWTH 1 DAY Performed at [facility]    Report Status PENDING  Incomplete  Culture, blood (x 2)     Status: None (Preliminary result)   Collection Time: 04/08/15 10:53 PM  Result Value Ref Range Status   Specimen Description BLOOD LEFT ANTECUBITAL  Final   Special Requests IN PEDIATRIC BOTTLE 2 ML  Final   Culture   Final    NO GROWTH 1 DAY Performed at [facility]    Report Status PENDING  Incomplete  MRSA PCR Screening     Status: None   Collection Time: 04/09/15  4:46 PM  Result Value Ref Range Status   MRSA by PCR NEGATIVE NEGATIVE Final    Comment:        The GeneXpert MRSA Assay (FDA approved for NASAL specimens only), is one component of a comprehensive MRSA colonization surveillance program. It is not intended to diagnose MRSA infection nor to guide or monitor treatment for MRSA infections.      Studies: No results found.   Scheduled Meds: . dextromethorphan-guaiFENesin  1 tablet Oral BID  . heparin  5,000 Units Subcutaneous 3 times per day  . insulin aspart  0-15 Units Subcutaneous TID WC  . insulin aspart  0-5 Units Subcutaneous QHS  . insulin aspart  5 Units Subcutaneous TID WC  . insulin glargine  55 Units Subcutaneous Q24H  . ipratropium-albuterol  3 mL Nebulization TID  . levofloxacin  750 mg Oral Daily  . sodium chloride flush  3 mL Intravenous Q12H   Continuous Infusions:   PRN Meds: acetaminophen **OR** acetaminophen, albuterol, benzonatate, ondansetron **OR** ondansetron (ZOFRAN) IV  Time spent: 25 minutes  Author: Eulogio Bear DO [facility] Pager: [phone]  04/11/2015 11:41 AM  If 7PM-7AM, please contact night-coverage at [email or web address], password [facility]

## 2015-04-11 NOTE — Progress Notes (Signed)
Pharmacy Antibiotic Note  KUPER SIVERS is a 55 y.o. male admitted on 04/08/2015 with sepsis.  Pharmacy has been consulted for Vancomycin & Zosyn dosing.  Renal fxn improved.  No labs yet this morning as pt difficult stick and no IV acess.  Phlebotomy has tried x3.    Abx narrowed to levaquin for HCAP.  Ok for PO per MD.   Plan:  Start levaquin 750mg  PO q24h  Monitor renal function and cx data   Duration of therapy per MD- de-escalate or discontinue abx as soon as clinically appropriate  Height: 5\' 8"  (172.7 cm) Weight: (!) 377 lb (171.006 kg) IBW/kg (Calculated) : 68.4  Temp (24hrs), Avg:98.9 F (37.2 C), Min:98.4 F (36.9 C), Max:99.2 F (37.3 C)   Recent Labs Lab 04/08/15 1531 04/08/15 2019  04/09/15 0422 04/09/15 1131 04/09/15 1311 04/09/15 1737 04/10/15 0319  WBC 6.9  --   --   --   --   --   --  5.8  CREATININE 3.16*  3.05*  --   < > 2.47* 2.61* 2.58* 2.58* 2.34*  LATICACIDVEN 1.7 1.5  --  1.1  --   --   --   --   < > = values in this interval not displayed.  Estimated Creatinine Clearance: 56.5 mL/min (by C-G formula based on Cr of 2.34).    No Known Allergies  Antimicrobials this admission: Zosyn 3/20  >> 3/23 Vanc 3/20 >> 3/23 Levaquin 3/23 >>  Dose adjustments this admission: 3/22 Vanc 2g q48h --> 1750 q24h for improved renal fxn  Microbiology results: 3/20 BCx: NGTD 3/20 Influenza panel: neg 3/20 Urine strep/legionella: neg/neg 3/21 MRSA PCR negative  Thank you for allowing pharmacy to be a part of this patient's care.  Ralene Bathe, PharmD, BCPS 04/11/2015, 8:12 AM  Pager: [phone]

## 2015-04-12 DIAGNOSIS — N179 Acute kidney failure, unspecified: Secondary | ICD-10-CM

## 2015-04-12 LAB — RENAL FUNCTION PANEL
Albumin: 3.4 g/dL — ABNORMAL LOW (ref 3.5–5.0)
Anion gap: 10 (ref 5–15)
BUN: 16 mg/dL (ref 6–20)
CALCIUM: 9.3 mg/dL (ref 8.9–10.3)
CO2: 26 mmol/L (ref 22–32)
CREATININE: 1.5 mg/dL — AB (ref 0.61–1.24)
Chloride: 100 mmol/L — ABNORMAL LOW (ref 101–111)
GFR calc non Af Amer: 51 mL/min — ABNORMAL LOW (ref >60)
GFR, EST AFRICAN AMERICAN: 60 mL/min — AB (ref >60)
GLUCOSE: 228 mg/dL — AB (ref 65–99)
Phosphorus: 2.5 mg/dL (ref 2.5–4.6)
Potassium: 4.4 mmol/L (ref 3.5–5.1)
SODIUM: 136 mmol/L (ref 135–145)

## 2015-04-12 LAB — GLUCOSE, CAPILLARY
Glucose-Capillary: 214 mg/dL — ABNORMAL HIGH (ref 65–99)
Glucose-Capillary: 206 mg/dL — ABNORMAL HIGH (ref 65–99)

## 2015-04-12 MED ORDER — BENZONATATE 100 MG PO CAPS: 100.0000 mg | ORAL_CAPSULE | Freq: Three times a day (TID) | ORAL | Status: DC | PRN

## 2015-04-12 MED ORDER — FUROSEMIDE 40 MG PO TABS: 40.0000 mg | ORAL_TABLET | Freq: Every day | ORAL | Status: DC

## 2015-04-12 MED ORDER — LEVOFLOXACIN 750 MG PO TABS: 750.0000 mg | ORAL_TABLET | Freq: Every day | ORAL | Status: DC

## 2015-04-12 MED ORDER — INSULIN GLARGINE 100 UNIT/ML ~~LOC~~ SOLN
60.0000 [IU] | SUBCUTANEOUS | Status: DC
  Administered 2015-04-12: 60 [IU] via SUBCUTANEOUS
  Filled 2015-04-12: qty 0.6

## 2015-04-12 MED ORDER — INSULIN GLARGINE 100 UNIT/ML ~~LOC~~ SOLN: 60.0000 [IU] | SUBCUTANEOUS | Status: DC

## 2015-04-12 NOTE — Progress Notes (Signed)
PT Cancellation Note  Patient Details Name: Timothy Le MRN: [id number] DOB: July 05, 1960   Cancelled Treatment:    Reason Eval/Treat Not Completed: PT screened, no needs identified, will sign off. Spoke with RN and pt-both report pt has been mobilizing without difficulty. Pt denied need for PT services at this time. Instructed pt to notify PCP if he has any difficulty after discharge. Will sign off.    Weston Anna, MPT Pager: [phone]

## 2015-04-12 NOTE — Progress Notes (Signed)
Please send levaquin prescription to [facility]  At [address], [place].

## 2015-04-12 NOTE — Discharge Summary (Signed)
Physician Discharge Summary  Timothy Le YHC:[id number] DOB: 19-Feb-1960 DOA: 04/08/2015  PCP: Kerin Perna, NP  Admit date: 04/08/2015 Discharge date: 04/12/2015  Time spent: 35 minutes  Recommendations for Outpatient Follow-up:  1. Home health 2. Prescriptions called into several different pharmacies per patient request 3. CPAP QHS and 2L O2 resumed 4. Bring log of blood sugars to PCP 5. Outpatient diabetic education as previously ordered- need further titration of diabetic meds but want to wait until patient has a better understanding   Discharge Diagnoses:  Principal Problem:   Hypovolemic shock ([place]) Active Problems:   Obesity, morbid ([place])   Obstructive sleep apnea   DM (diabetes mellitus) ([place])   HTN (hypertension)   AKI (acute kidney injury) ([place])   DKA, type 2 ([place])   Sepsis ([place])   Hyponatremia   Discharge Condition: improved  Diet recommendation: cardiac/diabetic  Filed Weights   04/10/15 0500 04/11/15 0500 04/12/15 0411  Weight: 169.645 kg (374 lb) 166.017 kg (366 lb) 166.47 kg (367 lb)    History of present illness:  Timothy Le is a 55 y.o. male with Past medical history of type 2 diabetes mellitus uncontrolled, COPD, sleep apnea, morbid obesity, hypertension. The patient presents with complains of high blood glucose. Patient mentions that he was checking his blood glucose at home and this was 500. He has been having nausea and vomiting since last 3 days. He has loose watery bowel movement 21 this morning. He denies having any complaints of chest pain or abdominal pain. Continues to complain of cough as well as shortness of breath. No fever no juices been reported. Appears to be lethargic. Mentions that he is compliant with all his medications. Denies any drug abuse. Also denies having any focal deficit  The patient is coming from home.  At his baseline ambulates without any support And is independent for most of his ADL; manages his  medication on his own.  Hospital Course:  Hypovolemia AKI on chronic kidney disease stage III: baseline around 1.2 Suspected sepsis with healthcare associated pneumonia Vancomycin and Zosyn has been initiated for an unidentified infection-- cultures negative, de-escalate abx to PO levaquin  DKA/Type 2 diabetes mellitus, uncontrolled, recent hemoglobin A1c in 04/08/15 11% Anion gap has been closed. The patient has been transitioned to subcutaneous insulin -increase home meds-- will need further titration but not sure patient could handle SSI at home-- limited knowledge of diabetic diet  Obstructive sleep apnea. Continue Cpap QHS  Hyponatremia. Due to hyperglycemia pseudohyponatremia. resolved  Hyperkalemia -resolved   Procedures: Echo: Study Conclusions  - Left ventricle: The cavity size was normal. There was mild  concentric hypertrophy. Systolic function was normal. The  estimated ejection fraction was in the range of 55% to 60%. Wall  motion was normal; there were no regional wall motion  abnormalities. Left ventricular diastolic function parameters  were normal. - Left atrium: The atrium was mildly dilated.  Impressions:  - Technically difficult study. Apical 2D images are very poor. The  intepretation is based primarily on parasternal and subcostal 2D   views.  Consultations:    Discharge Exam: Filed Vitals:   04/11/15 2315 04/12/15 0527  BP:  135/76  Pulse:  69  Temp:  98.4 F (36.9 C)  Resp: 18 18    General: awake, NAD Cardiovascular: rrr Respiratory: clear  Discharge Instructions   Discharge Instructions    (HEART FAILURE PATIENTS) Call MD:  Anytime you have any of the following symptoms: 1) 3 pound weight gain  in 24 hours or 5 pounds in 1 week 2) shortness of breath, with or without a dry hacking cough 3) swelling in the hands, feet or stomach 4) if you have to sleep on extra pillows at night in order to breathe.    Complete by:  As  directed      Diet - low sodium heart healthy    Complete by:  As directed      Discharge instructions    Complete by:  As directed   See PCP 1 week for BP check BMP 1 week as well Wear O2 as prescribed CPAP at night and while napping Keep log of diet and blood sugars, bring to PCP Home health RN     Increase activity slowly    Complete by:  As directed           Discharge Medication List as of 04/12/2015 11:02 AM    CONTINUE these medications which have CHANGED   Details  benzonatate (TESSALON) 100 MG capsule Take 1 capsule (100 mg total) by mouth 3 (three) times daily as needed for cough., Starting 04/12/2015, Until Discontinued, Normal    furosemide (LASIX) 40 MG tablet Take 1 tablet (40 mg total) by mouth daily., Starting 04/12/2015, Until Discontinued, Normal    insulin glargine (LANTUS) 100 UNIT/ML injection Inject 0.6 mLs (60 Units total) into the skin daily., Starting 04/12/2015, Until Discontinued, Normal    levofloxacin (LEVAQUIN) 750 MG tablet Take 1 tablet (750 mg total) by mouth daily., Starting 04/12/2015, Until Discontinued, Normal      CONTINUE these medications which have NOT CHANGED   Details  beclomethasone (QVAR) 40 MCG/ACT inhaler Inhale 2 puffs into the lungs daily., Starting 07/18/2013, Until Discontinued, Print    cholecalciferol (VITAMIN D) 1000 units tablet Take 1,000 Units by mouth daily., Until Discontinued, Historical Med    fluticasone (FLONASE) 50 MCG/ACT nasal spray Place 2 sprays into both nostrils daily as needed for allergies. , Until Discontinued, Historical Med    gemfibrozil (LOPID) 600 MG tablet Take 600 mg by mouth 2 (two) times daily before a meal. Thirty minutes before morning and evening meal., Until Discontinued, Historical Med    naproxen (NAPROSYN) 500 MG tablet Take 500 mg by mouth 2 (two) times daily as needed for mild pain., Until Discontinued, Historical Med    omeprazole (PRILOSEC) 20 MG capsule Take 20 mg by mouth daily as needed  (for acid reflex). , Until Discontinued, Historical Med    potassium chloride (K-DUR) 10 MEQ tablet Take 10 mEq by mouth daily., Until Discontinued, Historical Med    triamcinolone cream (KENALOG) 0.1 % Apply 1 application topically daily., Until Discontinued, Historical Med    blood glucose meter kit and supplies Dispense based on patient and insurance preference. Use up to four times daily as directed. (FOR ICD-9 250.00, 250.01)., Print      STOP taking these medications     amLODipine (NORVASC) 10 MG tablet      doxycycline (VIBRA-TABS) 100 MG tablet      lisinopril-hydrochlorothiazide (PRINZIDE,ZESTORETIC) 20-25 MG tablet        No Known Allergies Follow-up Information    Call [facility] .   Why:  As needed Delivery in your area occurs every Thursday at 1 pm Delivery cost will be $3 or $3.20 if you need any over the counter medications needed    Contact information:   [address]  [place], [place] [address]  Hours: Open today  8AM-8PM  Phone [phone]       Follow up with EDWARDS, MICHELLE P, NP In 1 week.   Specialty:  Internal Medicine   Contact information:   [place] [address] [phone]        The results of significant diagnostics from this hospitalization (including imaging, microbiology, ancillary and laboratory) are listed below for reference.    Significant Diagnostic Studies: Dg Chest 2 View  04/09/2015  CLINICAL DATA:  Pt complains of sob and productive cough. Hx of asthma, HTN, diabetes, and COPD. EXAM: CHEST  2 VIEW COMPARISON:  04/08/2015 FINDINGS: The study is limited by the patient's body habitus. Is also limited by inspiratory effect limitation. There is mild cardiac enlargement. The vascular pattern is normal. Mild bibasilar opacity likely reflects hypoventilatory change. IMPRESSION: Probable bibasilar atelectasis although study is limited as described above and it is difficult to exclude  pneumonia. Electronically Signed   By: Skipper Cliche M.D.   On: 04/09/2015 15:57   Dg Chest 2 View  04/08/2015  CLINICAL DATA:  Cough with shortness of breath for 2 months. Nonsmoker. EXAM: CHEST  2 VIEW COMPARISON:  03/27/2015 and 02/07/2015. FINDINGS: Interval improved aeration of the lung bases on the frontal examination. There are persistent low lung volumes and mild breathing artifact on the lateral view. The heart size and mediastinal contours are stable. The lungs appear clear based on the frontal examination. There is no pleural effusion or pneumothorax. IMPRESSION: Limited lateral view. No active cardiopulmonary process demonstrated on the frontal examination. Electronically Signed   By: Richardean Sale M.D.   On: 04/08/2015 15:13   Dg Chest 2 View  03/27/2015  CLINICAL DATA:  Increasing shortness of Breath EXAM: CHEST  2 VIEW COMPARISON:  02/07/2015 FINDINGS: Cardiac shadow remains enlarged. Mild atelectatic changes are noted in the left lung base. No focal confluent infiltrate is seen. The bony structures are within normal limits. IMPRESSION: Left basilar atelectasis. Electronically Signed   By: Inez Catalina M.D.   On: 03/27/2015 16:27   Abd 1 View (kub)  03/28/2015  CLINICAL DATA:  Diarrhea tonight. EXAM: ABDOMEN - 1 VIEW COMPARISON:  None. FINDINGS: The abdomen is incompletely covered within the field of view of this study. Scattered gas is demonstrated within small and large bowel. No small or large bowel distention is appreciated. Suggestion of mild small bowel fold thickening. This may indicate enteritis. No radiopaque stones. Degenerative changes in the spine and hips. IMPRESSION: No evidence of bowel obstruction. Nondistended gas-filled small bowel with wall thickening may indicate enteritis. Electronically Signed   By: Lucienne Capers M.D.   On: 03/28/2015 00:03    Microbiology: Recent Results (from the past 240 hour(s))  Culture, blood (x 2)     Status: None (Preliminary result)    Collection Time: 04/08/15  3:35 PM  Result Value Ref Range Status   Specimen Description BLOOD RIGHT HAND  Final   Special Requests IN PEDIATRIC BOTTLE 4CC  Final   Culture   Final    NO GROWTH 3 DAYS Performed at [facility]    Report Status PENDING  Incomplete  Culture, blood (x 2)     Status: None (Preliminary result)   Collection Time: 04/08/15 10:53 PM  Result Value Ref Range Status   Specimen Description BLOOD LEFT ANTECUBITAL  Final   Special Requests IN PEDIATRIC BOTTLE 2 ML  Final   Culture   Final    NO GROWTH 3 DAYS Performed at [facility]  Report Status PENDING  Incomplete  MRSA PCR Screening     Status: None   Collection Time: 04/09/15  4:46 PM  Result Value Ref Range Status   MRSA by PCR NEGATIVE NEGATIVE Final    Comment:        The GeneXpert MRSA Assay (FDA approved for NASAL specimens only), is one component of a comprehensive MRSA colonization surveillance program. It is not intended to diagnose MRSA infection nor to guide or monitor treatment for MRSA infections.      Labs: Basic Metabolic Panel:  Recent Labs Lab 04/09/15 1311 04/09/15 1737 04/10/15 0319 04/11/15 0800 04/12/15 0346  NA 134* 134* 135 139 136  K 4.2 4.2 5.2* 4.3 4.4  CL 96* 96* 98* 101 100*  CO2 '30 30 27 30 26  ' GLUCOSE 312* 363* 135* 228* 228*  BUN 46* 45* 36* 21* 16  CREATININE 2.58* 2.58* 2.34* 1.69* 1.50*  CALCIUM 8.2* 8.4* 8.5* 9.2 9.3  PHOS 3.4 2.4* 3.7 1.9* 2.5   Liver Function Tests:  Recent Labs Lab 04/08/15 1531  04/09/15 1311 04/09/15 1737 04/10/15 0319 04/11/15 0800 04/12/15 0346  AST 26  --   --   --   --   --   --   ALT 27  --   --   --   --   --   --   ALKPHOS 98  --   --   --   --   --   --   BILITOT 1.0  --   --   --   --   --   --   PROT 8.1  --   --   --   --   --   --   ALBUMIN 3.9  3.9  < > 3.1* 3.0* 3.1* 3.3* 3.4*  < > = values in this interval not displayed. No results for input(s): LIPASE, AMYLASE in the last 168  hours. No results for input(s): AMMONIA in the last 168 hours. CBC:  Recent Labs Lab 04/08/15 1531 04/10/15 0319 04/11/15 0800  WBC 6.9 5.8 4.5  NEUTROABS  --  2.5  --   HGB 14.6 12.4* 12.8*  HCT 46.0 38.4* 41.9  MCV 92.6 90.4 95.9  PLT 257 221 189   Cardiac Enzymes: No results for input(s): CKTOTAL, CKMB, CKMBINDEX, TROPONINI in the last 168 hours. BNP: BNP (last 3 results)  Recent Labs  12/27/14 1659 02/07/15 1540 03/27/15 1624  BNP 27.9 26.9 9.8    ProBNP (last 3 results) No results for input(s): PROBNP in the last 8760 hours.  CBG:  Recent Labs Lab 04/11/15 1154 04/11/15 1735 04/11/15 2133 04/12/15 0741 04/12/15 1248  GLUCAP 317* 284* 292* 214* 206*       Signed:  [facility] 04/12/2015, 3:41 PM

## 2015-04-12 NOTE — Progress Notes (Signed)
NUTRITION NOTE  RD consulted for nutrition education regarding diabetes.   Lab Results  Component Value Date   HGBA1C 11.0* 03/28/2015    RD provided "Carbohydrate Counting for People with Diabetes" handout from the Academy of Nutrition and Dietetics. Discussed different food groups and their effects on blood sugar, emphasizing carbohydrate-containing foods. Provided list of carbohydrates and recommended serving sizes of common foods.  Discussed importance of controlled and consistent carbohydrate intake throughout the day. Provided examples of ways to balance meals/snacks and encouraged intake of high-fiber, whole grain complex carbohydrates. Teach back method used.  Talked with pt and his sister, who was at bedside. PTA pt was consuming convenience foods such as soda, fast food, and "whatever I could get my hands on." Pt states that he was recently admitted at [facility] for hyperglycemia and provided with supplies for blood sugar monitoring at that time. After d/c he was checking his blood sugar when he woke up and after breakfast and often did not check any other time throughout the day. He notes that blood sugars consistently remained elevated. PTA he was feeling dehydrated and sister reports that pt drank 2 cases of soda (non-diet) the weekend before admission.   Talked with pt about the topics listed above and attempted to relate them to his daily habits and food preferences. Pt with some confusion throughout and question ability to fully grasp concepts.   Expect POOR compliance.  Body mass index is 55.82 kg/(m^2). Pt meets criteria for morbid obesity based on current BMI.  Current diet order is Heart Healthy/Carb Modified, patient is consuming approximately 100% of meals at this time. Labs and medications reviewed. No further nutrition interventions warranted at this time. RD contact information provided. If additional nutrition issues arise, please re-consult RD. RD feels that pt would  greatly benefit from ongoing, outpatient diet education.     Jarome Matin, RD, LDN Inpatient Clinical Dietitian Pager # [phone] After hours/weekend pager # [phone]

## 2015-04-12 NOTE — Progress Notes (Signed)
Patient refuses to wait for levaquin prescription to be confirmed at [facility] [address]. His "ride" is being very rude  and impatient at bedside. He was extensively educated on need to wait for antibiotic. He still refused to wait. Discharge instructions given and all questions answered.

## 2015-04-12 NOTE — Progress Notes (Addendum)
Inpatient Diabetes Program Recommendations  AACE/ADA: New Consensus Statement on Inpatient Glycemic Control (2015)  Target Ranges:  Prepandial:   less than 140 mg/dL      Peak postprandial:   less than 180 mg/dL (1-2 hours)      Critically ill patients:  140 - 180 mg/dL   Review of Glycemic Control Results for Timothy Le, Timothy Le (MRN [id number]) as of 04/12/2015 11:04  Ref. Range 04/11/2015 08:46 04/11/2015 11:54 04/11/2015 17:35 04/11/2015 21:33 04/12/2015 07:41  Glucose-Capillary Latest Ref Range: 65-99 mg/dL 227 (H) 317 (H) 284 (H) 292 (H) 214 (H)    Current orders for Inpatient glycemic control: Lantus 60 units daily + Moderate Novolog correction scale + Novolog 5 units tid meal coverage  Inpatient Diabetes Program Recommendations:  Agree with increase of lantus. MD, please consider increase in Novolog meal coverage to 7 units tid with meals (hold if pt. Eats <50%).  Nani Gasser Casimer Russett, RN, MSN, CDE Inpatient Glycemic Control Team Team Pager [phone] (8am-5pm) 04/12/2015 11:06 AM

## 2015-04-14 LAB — CULTURE, BLOOD (ROUTINE X 2)
Culture: NO GROWTH
Culture: NO GROWTH

## 2015-04-25 ENCOUNTER — Encounter: Payer: Medicaid Other | Attending: Primary Care | Admitting: *Deleted

## 2015-04-25 DIAGNOSIS — E1169 Type 2 diabetes mellitus with other specified complication: Secondary | ICD-10-CM | POA: Diagnosis not present

## 2015-04-25 DIAGNOSIS — E119 Type 2 diabetes mellitus without complications: Secondary | ICD-10-CM

## 2015-04-25 NOTE — Patient Instructions (Addendum)
Test sugar three times per day Fasting (first thing in the morning),  2 hours after lunch,     Before bed  Avoid Delivery / Take out food (chinese, pizza,) OK.Marland KitchenMarland KitchenMarland KitchenSub sandwich(no fried) or Salad is OK Instead of regular Gatoraid consider G2 or PowerAid Zero  Easter - Just be careful and smart  Breakfast: egg, sausage, english muffin, 1 pack grits, coffee with splenda & creamer Snack: piece of fruit with some nuts or cheese Lunch: chicken salad wrap / [place], cheese sandwich, mustard, / cheeseburger, lettuce, tomato & salad Dinner: chicken, roasted vegetables, baked sweet potato Snack: Skinny cow ice cream sandwich  Fish: Salmon, talapia  Be strong, reduce your portions and don't go back for seconds. Don't eat in front of the TV Listen to your home nurse !!!!!  She is fabulous!!!

## 2015-05-01 ENCOUNTER — Encounter: Payer: Self-pay | Admitting: *Deleted

## 2015-05-04 ENCOUNTER — Encounter: Payer: Self-pay | Admitting: *Deleted

## 2015-05-04 NOTE — Progress Notes (Signed)
Diabetes Self-Management Education  Visit Type: First/Initial  Appt. Start Time: 0930 Appt. End Time: 1100  05/04/2015  [name], identified by name and date of birth, is a 55 y.o. male with a diagnosis of Diabetes: Type 2. Timothy Le lives alone. He has a 49yo son. It is his son for whom he wants to be healthy. 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 He is making modifications in the way he cooks. He also has a home health nurse that has been providing him with good direction.  ASSESSMENT  There were no vitals taken for this visit. There is no weight on file to calculate BMI.      Diabetes Self-Management Education - 04/25/15 0935    Visit Information   Visit Type First/Initial   Initial Visit   Diabetes Type Type 2   Are you currently following a meal plan? Yes   Date Diagnosed 2016   Health Coping   How would you rate your overall health? Fair   Psychosocial Assessment   Patient Belief/Attitude about Diabetes --  no comment   Self-care barriers None   Self-management support Doctor's office;CDE visits   Other persons present Patient   Patient Concerns Nutrition/Meal planning;Medication;Monitoring;Glycemic Control   Special Needs None   Preferred Learning Style No preference indicated   Learning Readiness Change in progress   How often do you need to have someone help you when you read instructions, pamphlets, or other written materials from your doctor or pharmacy? 3 - Sometimes   What is the last grade level you completed in school? High school   Complications   Last HgB A1C per patient/outside source 11 %   How often do you check your blood sugar? 1-2 times/day   Fasting Blood glucose range (mg/dL) >200   Postprandial Blood glucose range (mg/dL) >200   Have you had a dilated eye exam in the past 12 months? No   Have you had a dental exam in the past 12 months? No   Are you checking your feet? Yes   How many days per week are you checking your feet? 7   Dietary  Intake   Breakfast 2 eggs, 2 pieces [place] sausage, instant grits, toast X1 (whole wheat) or biscuit / cereal cheerios 2C with 2% milk   Dinner grilled chicken breast, steamed vegetables, bottle water   Snack (evening) grapes, banana, orange   Beverage(s) fruit juice 16oz,, water / crystal light flavoring, gatorade regular, Diet Soda, ice tea with splenda,   Exercise   Exercise Type Light (walking / raking leaves)   How many minutes per day do you exercise? 30   Patient Education   Previous Diabetes Education No   Disease state  Definition of diabetes, type 1 and 2, and the diagnosis of diabetes;Factors that contribute to the development of diabetes   Nutrition management  Role of diet in the treatment of diabetes and the relationship between the three main macronutrients and blood glucose level;Information on hints to eating out and maintain blood glucose control.;Meal options for control of blood glucose level and chronic complications.   Physical activity and exercise  Role of exercise on diabetes management, blood pressure control and cardiac health.   Monitoring Purpose and frequency of SMBG.   Chronic complications Relationship between chronic complications and blood glucose control;Lipid levels, blood glucose control and heart disease   Psychosocial adjustment Role of stress on diabetes   Individualized Goals (developed by patient)   Nutrition General guidelines  for healthy choices and portions discussed   Medications take my medication as prescribed   Reducing Risk increase portions of nuts and seeds   Outcomes   Expected Outcomes Demonstrated interest in learning. Expect positive outcomes   Future DMSE PRN   Program Status Completed      Individualized Plan for Diabetes Self-Management Training:   Learning Objective:  Patient will have a greater understanding of diabetes self-management. Patient education plan is to attend individual and/or group sessions per assessed needs and  concerns.   Plan:   Patient Instructions  Test sugar three times per day Fasting (first thing in the morning),  2 hours after lunch,     Before bed  Avoid Delivery / Take out food (chinese, pizza,) OK.Marland KitchenMarland KitchenMarland KitchenSub sandwich(no fried) or Salad is OK Instead of regular Gatoraid consider G2 or PowerAid Zero  Easter - Just be careful and smart  Breakfast: egg, sausage, english muffin, 1 pack grits, coffee with splenda & creamer Snack: piece of fruit with some nuts or cheese Lunch: chicken salad wrap / [place], cheese sandwich, mustard, / cheeseburger, lettuce, tomato & salad Dinner: chicken, roasted vegetables, baked sweet potato Snack: Skinny cow ice cream sandwich  Fish: Salmon, talapia  Be strong, reduce your portions and don't go back for seconds. Don't eat in front of the TV Listen to your home nurse !!!!!  She is fabulous!!!   Expected Outcomes:  Demonstrated interest in learning. Expect positive outcomes  Education material provided: Living well with Diabetes, My Plate, carbohydrate food guide, support group  If problems or questions, patient to contact team via:  Phone  Future DSME appointment: PRN

## 2015-07-11 ENCOUNTER — Encounter: Payer: Self-pay | Admitting: Internal Medicine

## 2015-07-11 ENCOUNTER — Ambulatory Visit (INDEPENDENT_AMBULATORY_CARE_PROVIDER_SITE_OTHER): Payer: Medicaid Other | Admitting: Internal Medicine

## 2015-07-11 VITALS — BP 126/80 | HR 83 | Ht 68.0 in | Wt 365.4 lb

## 2015-07-11 DIAGNOSIS — J9621 Acute and chronic respiratory failure with hypoxia: Secondary | ICD-10-CM

## 2015-07-11 DIAGNOSIS — G4733 Obstructive sleep apnea (adult) (pediatric): Secondary | ICD-10-CM | POA: Diagnosis not present

## 2015-07-11 NOTE — Progress Notes (Signed)
07/27/13- 55 yoM former smoker referred by  Juluis Mire, NP at [facility] at [facility]; COPD Patient reports concerns with his breathing in general, possible COPD, possible sleep apnea. He quit smoking over 5 years ago. Describes adult onset asthma with no history of pneumonia or heart disease and no history of blood clots in the family. He is treated for hypertension and diabetes. He describes cough most days, productive of thick white phlegm with no blood. He sometimes notices a little wheeze. Dyspnea with any sustained walking, especially briskly or on hills or stairs. He has taken prednisone  Intermittently with most recent taper ending one week ago. Using albuterol rescue inhaler and Qvar 40. He does not notice reflux or significant nasal congestion or drainage but admits he may have some seasonal nasal congestion especially spring and fall. Question of sleep apnea-loud snoring for years, daytime sleepiness. He has had a loud breathing noise in his throat for the past 2 years CT chest 05/15/13  IMPRESSION:  No evidence of pulmonary embolus.  Enlarged left thyroid lobe with presumed focal nodule/mass. This  could be further evaluated with thyroid ultrasound.  No acute findings in the chest.  Electronically Signed  By: [name] M.D.  On: 05/15/2013 16:52   09/21/13- 55 yoM former smoker followed for hx COPD, ? OSA, complicated by morbid obesity FOLLOWS FOR:  pt had PFT done today.  legs are aching and swelling--same issues since last OV NPSG 08/08/13- Severe OSA, AHI 46.7/ hr, CPAP to 20 cwp, Weight 385 lbs. PFT 09/21/13- Moderate restriction c/w OHS. FVC 2.32/ 57%, FEV1 1.91/ 59%, FEV1/FVC 0.82,  FEF25-75% 2.42/76%-signif response to BD only in small airways, TLC 69%, DLCO 68%  11/13/13- 55 yoM former smoker followed for hx COPD, ? OSA, complicated by morbid obesity FOLLOW FOR:  OSA; CPAP  wears every night approx 8 hours nightly; Pt complains of SOB, wheezing, coughing when he takes off  CPAP, mouth is dry; Pt has disability papers he wants to discuss- just an infomational letter. His PCP stopped lasix, apparently concerned over dried. Dry mouth is  Bothersome.  03/29/14- 53 yoM former smoker followed for hx COPD,  OSA, complicated by morbid obesity, OHS CPAP 20/ APS FOLLOWS FOR: pt was not able to have ONO done due to living matters-can do it now as he has his own house. Pt wears CPAP almost every night. Pt tosses and turns all night-stomach sleeper. DME is [facility], Walk test 03/29/14 desat to 87% room air, 2 laps x 185 feet Expects more short of breath in hot weather  06/04/14-  53 yoM former smoker followed for hx COPD,  OSA, complicated by morbid obesity, OHS CPAP 20/ APS  Follows For: Pt c/o SOB with weather change, wheezing, prod cough with thick white mucus. Uses CPAP 6-8 hours 4-5 nights a week.  He likes to use CPAP in the daytime sitting upright in the chair. At night he wants to sleep on his stomach and dislikes CPAP then.  09/06/14- 55 yoM former smoker followed for hx COPD,  OSA, complicated by morbid obesity, OHS CPAP 20/ APS FOLLOWS FOR: Wears CPAP20/ APS every night for more than 8 hours; not wearing any O2 during day or night. No recent DL from [facility]-will need to order and enroll in [place]. He got a recliner so he can sleep elevated with his CPAP. He is very pleased now, feeling much better, sleeping all night with CPAP. His been able to lose about 25 pounds and is beginning to  make an effort. He had gone for bariatric assessment before but they didn't have him scheduled-unclear where the problem was. ONOX 04/18/14 on CPAP with room air had shown sustained desaturation. Medicare would require an attended sleep study with CPAP to confirm he is needing supplemental oxygen  03/11/2015-55 year old male former smoker followed for history COPD, OSA, complicated by morbid obesity, OHS CPAP 20/ APS FOLLOWS FOR:  pt. states he's unable to wear CPAP due to it taking his breath.  states he plans on starting to wear it again. c/o occ. SOB. prod. cough tan in color. wheezing. occ. chest pain/tightness. Started prednisone and Levaquin 02/10/2015. CPAP download shows inadequate use and inadequate control at 20 CWP but he only used a few days. He was discharged on prednisone 40 mg daily and recognizes that is causing fluid retention. Hospitalized in January "fluid retention". Continues oxygen 2 L continuously/APS. CXR 02/07/2015 IMPRESSION: No active cardiopulmonary disease. Electronically Signed  By: Marijo Conception, M.D.  On: 02/07/2015 16:25  07/11/2015-55 year old male former smoker followed for COPD, OSA, complicated by morbid obesity, OHS CPAP 20 with O2 2 L continuous/APS He has been able to lose some weight Is quite proud. Hospitalization help him get control of his blood sugar and he is going to nutrition clinic classes for this. Now using oxygen which has also helped. Less dyspnea on exertion  ROS-see HPI Constitutional:   + weight loss, night sweats, fevers, chills, fatigue, lassitude. HEENT:   No-  headaches, difficulty swallowing, tooth/dental problems, sore throat,       No-  sneezing, itching, ear ache, nasal congestion, post nasal drip,  CV:  No-   chest pain, +orthopnea, PND, +swelling in lower extremities, no-anasarca, dizziness, palpitations Resp: +shortness of breath with exertion or at rest.              No-   productive cough,  + non-productive cough,  No- coughing up of blood.              No-   change in color of mucus.   wheezing.   Skin: No-   rash or lesions. GI:  No-   heartburn, indigestion, +abdominal pain,no- nausea, vomiting,  GU:  MS:  + joint pain or swelling.  . Neuro-     nothing unusual Psych:  No- change in mood or affect. No depression or anxiety.  No memory loss.  OBJ- Physical Exam General- Alert, Oriented, Affect-appropriate, Distress- none acute,  + morbidly obese, + portable O2                       + So fat that he has  to sprawl back in chair to unload abd from diaphragm so he can breathe. Skin- rash-none, lesions- none, excoriation- none Lymphadenopathy- none Head- atraumatic            Eyes- Gross vision intact, PERRLA, conjunctivae and secretions clear            Ears- Hearing, canals-normal            Nose- Clear, no-Septal dev, mucus, polyps, erosion, perforation             Throat- Mallampati IV , mucosa clear , drainage- none, tonsils- atrophic, + somewhat stridorous respiration                    + Shallow consistent with body habitus, normal voice quality Neck- +very thick , trachea midline,  Stridor+ , thyroid nl, carotid  no bruit Chest - symmetrical excursion , unlabored           Heart/CV- RRR , no murmur , no gallop  , no rub, nl s1 s2                           - JVD- none , edema+2-3, stasis changes- none, varices- none           Lung- clear to P&A, wheeze- none, cough- none , dullness-none, rub- none                            + Labored resps if he tries to sit forward and talk.            Chest wall-  Abd-  Br/ Gen/ Rectal- Not done, not indicated Extrem- cyanosis- none, clubbing, none, atrophy- none, strength- nl Neuro- grossly intact to observation

## 2015-07-11 NOTE — Assessment & Plan Note (Signed)
He is being compliant with portable O2 2 L and understands its important to continue this.

## 2015-07-11 NOTE — Assessment & Plan Note (Signed)
Any weight loss is important for him. He is encouraged and motivated currently, supported by his nutrition class and diabetic counseling.

## 2015-07-11 NOTE — Patient Instructions (Signed)
Order- schedule CPAP/ BIPAP titration sleep study    Dx OSA

## 2015-07-11 NOTE — Assessment & Plan Note (Signed)
Borderline control with CPAP at maximum pressure 20. This is going to be important. I think he is going to need more pressure, requiring BiPAP. Continued weight loss is critical as explained. Plan-CPAP/BiPAP titration on O2 to reassess best pressure

## 2015-09-05 ENCOUNTER — Ambulatory Visit (HOSPITAL_BASED_OUTPATIENT_CLINIC_OR_DEPARTMENT_OTHER): Payer: Medicaid Other | Attending: Internal Medicine | Admitting: Internal Medicine

## 2015-09-05 VITALS — Ht 68.0 in | Wt 365.0 lb

## 2015-09-05 DIAGNOSIS — G4733 Obstructive sleep apnea (adult) (pediatric): Secondary | ICD-10-CM | POA: Insufficient documentation

## 2015-09-05 DIAGNOSIS — R0683 Snoring: Secondary | ICD-10-CM | POA: Diagnosis not present

## 2015-09-29 DIAGNOSIS — G4733 Obstructive sleep apnea (adult) (pediatric): Secondary | ICD-10-CM

## 2015-09-29 NOTE — Procedures (Signed)
  Patient Name: Timothy Le, Timothy Le Date: 09/05/2015 Gender: Male D.O.B: 01/11/1961 Age (years): 57 Referring Provider: Baird Lyons MD, ABSM Height (inches): 68 Interpreting Physician: Baird Lyons MD, ABSM Weight (lbs): 365 RPSGT: Baxter Flattery BMI: 55 MRN: Neck Size: 21.50 CLINICAL INFORMATION The patient is referred for a BiPAP titration to treat sleep apnea.   Date of NPSG, Split Night or HST:  Diagnostic NPSG 08/08/13  AHI 21.1/ hr, desaturation to 80%, body weight 385 lbs  SLEEP STUDY TECHNIQUE As per the AASM Manual for the Scoring of Sleep and Associated Events v2.3 (April 2016) with a hypopnea requiring 4% desaturations. The channels recorded and monitored were frontal, central and occipital EEG, electrooculogram (EOG), submentalis EMG (chin), nasal and oral airflow, thoracic and abdominal wall motion, anterior tibialis EMG, snore microphone, electrocardiogram, and pulse oximetry. Bilevel positive airway pressure (BPAP) was initiated at the beginning of the study and titrated to treat sleep-disordered breathing.  MEDICATIONS Medications taken by the patient : charted for review Medications administered by patient during sleep study : No sleep medicine administered.  RESPIRATORY PARAMETERS Optimal IPAP Pressure (cm): 22 AHI at Optimal Pressure (/hr) 13.0 Optimal EPAP Pressure (cm): 18   Overall Minimal O2 (%): 71.00 Minimal O2 at Optimal Pressure (%): 88.0  SLEEP ARCHITECTURE Start Time: 10:25:59 PM Stop Time: 4:22:34 AM Total Time (min): 356.6 Total Sleep Time (min): 297.0 Sleep Latency (min): 26.1 Sleep Efficiency (%): 83.3 REM Latency (min): 110.0 WASO (min): 33.5 Stage N1 (%): 7.58 Stage N2 (%): 59.09 Stage N3 (%): 0.51 Stage R (%): 32.83 Supine (%): 100.00 Arousal Index (/hr): 27.7      CARDIAC DATA The 2 lead EKG demonstrated sinus rhythm, pacemaker generated. The mean heart rate was 65.90 beats per minute. Other EKG findings include: None.  LEG MOVEMENT  DATA The total Periodic Limb Movements of Sleep (PLMS) were 74. The PLMS index was 14.95. A PLMS index of <15 is considered normal in adults.  IMPRESSIONS - An optimal BiPAP pressure was selected for this patient ( 22 / 18 cm of water) - CPAP could not provide adequate comfortable pressure and BIPAP was required. - Central sleep apnea was not noted during this titration (CAI = 0.0/h). - Severe oxygen desaturations were observed during this titration (min O2 = 71.00%). Supplemental O2 was not required. - The patient snored with Soft snoring volume. - No cardiac abnormalities were observed during this study. - Mild periodic limb movements were observed during this study. Arousals associated with PLMs were rare.  DIAGNOSIS - Obstructive Sleep Apnea (327.23 [G47.33 ICD-10])  RECOMMENDATIONS - Trial of BiPAP therapy on 22/18 cm H2O with a Large size Resmed Full Face Mask AirFit F20 mask and heated humidification. - Avoid alcohol, sedatives and other CNS depressants that may worsen sleep apnea and disrupt normal sleep architecture. - Sleep hygiene should be reviewed to assess factors that may improve sleep quality. - Weight management and regular exercise should be initiated or continued.  [Electronically signed] 09/29/2015 01:01 PM  Baird Lyons MD, [place], American Board of Sleep Medicine   NPI: [id number]  [place], [facility]  ELECTRONICALLY SIGNED ON:  09/29/2015, 12:55 PM [place] PH: [phone]   FX: [phone] [place]

## 2015-10-14 ENCOUNTER — Encounter: Payer: Self-pay | Admitting: Internal Medicine

## 2015-10-14 ENCOUNTER — Ambulatory Visit (INDEPENDENT_AMBULATORY_CARE_PROVIDER_SITE_OTHER): Payer: Medicaid Other | Admitting: Internal Medicine

## 2015-10-14 VITALS — BP 120/76 | HR 86 | Ht 68.0 in | Wt 353.0 lb

## 2015-10-14 DIAGNOSIS — G4733 Obstructive sleep apnea (adult) (pediatric): Secondary | ICD-10-CM | POA: Diagnosis not present

## 2015-10-14 DIAGNOSIS — Z23 Encounter for immunization: Secondary | ICD-10-CM | POA: Diagnosis not present

## 2015-10-14 NOTE — Progress Notes (Signed)
HPI  M former smoker followed for COPD, OSA, OHS, complicated by morbid obesity   03/11/2015-55 year old male former smoker followed for history COPD, OSA, complicated by morbid obesity, OHS CPAP 20/ APS FOLLOWS FOR:  pt. states he's unable to wear CPAP due to it taking his breath. states he plans on starting to wear it again. c/o occ. SOB. prod. cough tan in color. wheezing. occ. chest pain/tightness. Started prednisone and Levaquin 02/10/2015. CPAP download shows inadequate use and inadequate control at 20 CWP but he only used a few days. He was discharged on prednisone 40 mg daily and recognizes that is causing fluid retention. Hospitalized in January "fluid retention". Continues oxygen 2 L continuously/APS. CXR 02/07/2015 IMPRESSION: No active cardiopulmonary disease. Electronically Signed  By: Marijo Conception, M.D.  On: 02/07/2015 16:25  07/11/2015-54 year old male former smoker followed for COPD, OSA, complicated by morbid obesity, OHS CPAP 20 with O2 2 L continuous/APS He has been able to lose some weight Is quite proud. Hospitalization help him get control of his blood sugar and he is going to nutrition clinic classes for this. Now using oxygen which has also helped. Less dyspnea on exertion  10/14/2015-55 year old male former smoker followed for COPD, hypoxic respiratory failure, OSA, complicated by morbid obesity, OHS, DM, CHF NPSG 08/08/13- AHI 21.1/ hr, desaturation to 80%, body weight 385 lbs BIPAP titration 09/05/15 optimal pressure 22/18, bosy weight 365 lbs. CPAP 20 with O2 2 L continuous/APS  > changed today to BIPAP 22/18, PS 4, O1 2L/ APS           weight today 353 pounds FOLLOWS FOR: Pt states he is not using CPAP machine-hard to sleep on his back. CPAP Titration from 09-05-15 attached. We discussed goals, compliance, comfort and encouraged continued weight loss. CXR 04/09/2015 IMPRESSION: Probable bibasilar atelectasis although study is limited as described above  and it is difficult to exclude pneumonia. Electronically Signed   By: Skipper Cliche M.D.   On: 04/09/2015 15:57  ROS-see HPI Constitutional:   + weight loss, night sweats, fevers, chills, fatigue, lassitude. HEENT:   No-  headaches, difficulty swallowing, tooth/dental problems, sore throat,       No-  sneezing, itching, ear ache, nasal congestion, post nasal drip,  CV:  No-   chest pain, +orthopnea, PND, +swelling in lower extremities, no-anasarca, dizziness, palpitations Resp: +shortness of breath with exertion or at rest.              No-   productive cough,  + non-productive cough,  No- coughing up of blood.              No-   change in color of mucus.   wheezing.   Skin: No-   rash or lesions. GI:  No-   heartburn, indigestion, +abdominal pain,no- nausea, vomiting,  GU:  MS:  + joint pain or swelling.  . Neuro-     nothing unusual Psych:  No- change in mood or affect. No depression or anxiety.  No memory loss.  OBJ- Physical Exam General- Alert, Oriented, Affect-appropriate, Distress- none acute,  + morbidly obese, + portable O2                       + So fat that he has to sprawl back in chair to unload abd from diaphragm so he can breathe. Skin- rash-none, lesions- none, excoriation- none Lymphadenopathy- none Head- atraumatic            Eyes- Gross vision intact,  PERRLA, conjunctivae and secretions clear            Ears- Hearing, canals-normal            Nose- Clear, no-Septal dev, mucus, polyps, erosion, perforation             Throat- Mallampati IV , mucosa clear , drainage- none, tonsils- atrophic, + somewhat stridorous respiration                    + Shallow consistent with body habitus, normal voice quality Neck- +very thick , trachea midline,  Stridor+ , thyroid nl, carotid no bruit Chest - symmetrical excursion , unlabored           Heart/CV- RRR , no murmur , no gallop  , no rub, nl s1 s2                           - JVD- none , edema+1-2, stasis changes- none,  varices- none           Lung- clear to P&A, wheeze- none, cough- none , dullness-none, rub- none                            + Labored resps if he tries to sit forward and talk.            Chest wall-  Abd-  Br/ Gen/ Rectal- Not done, not indicated Extrem- cyanosis- none, clubbing, none, atrophy- none, strength- nl Neuro- grossly intact to observation

## 2015-10-14 NOTE — Patient Instructions (Signed)
Keep working to lose more weight- proud of what you have accomplished.  Order- DME [facility]   Please change to BIPAP 22/18,  PS 4, with O2 2 l, mask of choice, humidifier, supplies AirView                             Dx OSA and chronic respiratory failure with hypoxia  Flu vax

## 2015-10-16 NOTE — Assessment & Plan Note (Signed)
Continued efforts at weight loss is strongly encouraged. He is lost about 20 pounds since his original sleep study in 2015. Bariatric counseling remains option.

## 2015-10-16 NOTE — Assessment & Plan Note (Signed)
Pressures identified by BiPAP titration are high and we will have to see if he can tolerate them but for now he is set with BiPAP 22/18, P S4/APS

## 2015-11-05 ENCOUNTER — Other Ambulatory Visit: Payer: Self-pay | Admitting: Internal Medicine

## 2015-11-05 ENCOUNTER — Other Ambulatory Visit (HOSPITAL_COMMUNITY): Payer: Self-pay | Admitting: Internal Medicine

## 2015-11-05 ENCOUNTER — Other Ambulatory Visit: Payer: Medicaid Other

## 2015-11-05 ENCOUNTER — Ambulatory Visit (HOSPITAL_COMMUNITY)
Admission: RE | Admit: 2015-11-05 | Discharge: 2015-11-05 | Disposition: A | Discharge status: Home / Self Care | Payer: Medicaid Other | Source: Ambulatory Visit | Attending: Internal Medicine | Admitting: Internal Medicine

## 2015-11-05 ENCOUNTER — Ambulatory Visit (HOSPITAL_COMMUNITY): Admission: RE | Admit: 2015-11-05 | Payer: Medicaid Other | Source: Ambulatory Visit

## 2015-11-05 DIAGNOSIS — R609 Edema, unspecified: Secondary | ICD-10-CM

## 2015-11-05 DIAGNOSIS — M109 Gout, unspecified: Secondary | ICD-10-CM

## 2015-11-05 DIAGNOSIS — M79604 Pain in right leg: Secondary | ICD-10-CM

## 2015-11-05 DIAGNOSIS — M7989 Other specified soft tissue disorders: Principal | ICD-10-CM

## 2015-11-05 DIAGNOSIS — M10071 Idiopathic gout, right ankle and foot: Secondary | ICD-10-CM | POA: Insufficient documentation

## 2015-11-06 ENCOUNTER — Ambulatory Visit (HOSPITAL_COMMUNITY)
Admission: RE | Admit: 2015-11-06 | Discharge: 2015-11-06 | Disposition: A | Discharge status: Home / Self Care | Payer: Medicaid Other | Source: Ambulatory Visit | Attending: Internal Medicine | Admitting: Internal Medicine

## 2015-11-06 DIAGNOSIS — E119 Type 2 diabetes mellitus without complications: Secondary | ICD-10-CM | POA: Insufficient documentation

## 2015-11-06 DIAGNOSIS — M79604 Pain in right leg: Secondary | ICD-10-CM | POA: Insufficient documentation

## 2015-11-06 DIAGNOSIS — M7989 Other specified soft tissue disorders: Secondary | ICD-10-CM

## 2015-11-06 NOTE — Progress Notes (Signed)
**  Preliminary report by tech**  Right lower extremity venous duplex completed. There is no evidence of deep or superficial vein thrombosis involving the right lower extremity. All visualized vessels appear patent and compressible. There is no evidence of a Baker's cyst on the right. Results were given to Dr. Buddy Duty.   11/06/15 3:59 PM Timothy Le RVT

## 2016-01-22 ENCOUNTER — Other Ambulatory Visit (INDEPENDENT_AMBULATORY_CARE_PROVIDER_SITE_OTHER): Payer: Medicaid Other

## 2016-01-22 ENCOUNTER — Encounter: Payer: Self-pay | Admitting: Internal Medicine

## 2016-01-22 ENCOUNTER — Ambulatory Visit (INDEPENDENT_AMBULATORY_CARE_PROVIDER_SITE_OTHER): Payer: Medicaid Other | Admitting: Internal Medicine

## 2016-01-22 VITALS — BP 132/78 | HR 113 | Ht 68.0 in | Wt 358.0 lb

## 2016-01-22 DIAGNOSIS — J209 Acute bronchitis, unspecified: Secondary | ICD-10-CM | POA: Diagnosis not present

## 2016-01-22 DIAGNOSIS — J9621 Acute and chronic respiratory failure with hypoxia: Secondary | ICD-10-CM

## 2016-01-22 DIAGNOSIS — J441 Chronic obstructive pulmonary disease with (acute) exacerbation: Secondary | ICD-10-CM

## 2016-01-22 DIAGNOSIS — G4733 Obstructive sleep apnea (adult) (pediatric): Secondary | ICD-10-CM | POA: Diagnosis not present

## 2016-01-22 LAB — CBC WITH DIFFERENTIAL/PLATELET
BASOS ABS: 0.1 10*3/uL (ref 0.0–0.1)
Basophils Relative: 0.9 % (ref 0.0–3.0)
EOS ABS: 0.1 10*3/uL (ref 0.0–0.7)
Eosinophils Relative: 1.3 % (ref 0.0–5.0)
HCT: 37.1 % — ABNORMAL LOW (ref 39.0–52.0)
Hemoglobin: 12.4 g/dL — ABNORMAL LOW (ref 13.0–17.0)
LYMPHS ABS: 2.7 10*3/uL (ref 0.7–4.0)
Lymphocytes Relative: 26.6 % (ref 12.0–46.0)
MCHC: 33.5 g/dL (ref 30.0–36.0)
MCV: 85.7 fl (ref 78.0–100.0)
MONO ABS: 0.4 10*3/uL (ref 0.1–1.0)
MONOS PCT: 3.7 % (ref 3.0–12.0)
NEUTROS PCT: 67.5 % (ref 43.0–77.0)
Neutro Abs: 6.9 10*3/uL (ref 1.4–7.7)
Platelets: 395 10*3/uL (ref 150.0–400.0)
RBC: 4.33 Mil/uL (ref 4.22–5.81)
RDW: 17.4 % — ABNORMAL HIGH (ref 11.5–15.5)
WBC: 10.3 10*3/uL (ref 4.0–10.5)

## 2016-01-22 LAB — BASIC METABOLIC PANEL
BUN: 18 mg/dL (ref 6–23)
CHLORIDE: 96 meq/L (ref 96–112)
CO2: 32 meq/L (ref 19–32)
Calcium: 10 mg/dL (ref 8.4–10.5)
Creatinine, Ser: 1.14 mg/dL (ref 0.40–1.50)
GFR: 85.97 mL/min (ref >60.00)
Glucose, Bld: 141 mg/dL — ABNORMAL HIGH (ref 70–99)
POTASSIUM: 3.8 meq/L (ref 3.5–5.1)
SODIUM: 139 meq/L (ref 135–145)

## 2016-01-22 LAB — BRAIN NATRIURETIC PEPTIDE: PRO B NATRI PEPTIDE: 26 pg/mL (ref 0.0–100.0)

## 2016-01-22 MED ORDER — AZITHROMYCIN 250 MG PO TABS: ORAL_TABLET | ORAL | 0 refills | Status: DC

## 2016-01-22 MED ORDER — METHYLPREDNISOLONE ACETATE 80 MG/ML IJ SUSP
80.0000 mg | Freq: Once | INTRAMUSCULAR | Status: AC
  Administered 2016-01-22: 80 mg via INTRAMUSCULAR

## 2016-01-22 MED ORDER — LEVALBUTEROL HCL 0.63 MG/3ML IN NEBU
0.6300 mg | INHALATION_SOLUTION | Freq: Once | RESPIRATORY_TRACT | Status: AC
  Administered 2016-01-22: 0.63 mg via RESPIRATORY_TRACT

## 2016-01-22 NOTE — Progress Notes (Signed)
HPI  M former smoker followed for COPD, OSA, OHS, complicated by morbid obesity NPSG 08/08/13- AHI 21.1/ hr, desaturation to 80%, body weight 385 lbs BIPAP titration 09/05/15 optimal pressure 22/18, body weight 365 lbs --------------------------------------------------------   10/14/2015-56 year old male former smoker followed for COPD, hypoxic respiratory failure, OSA, complicated by morbid obesity, OHS, DM, CHF NPSG 08/08/13- AHI 21.1/ hr, desaturation to 80%, body weight 385 lbs BIPAP titration 09/05/15 optimal pressure 22/18, body weight 365 lbs. CPAP 20 with O2 2 L continuous/APS  > changed today to BIPAP 22/18, PS 4, O1 2L/ APS           weight today 353 pounds FOLLOWS FOR: Pt states he is not using CPAP machine-hard to sleep on his back. CPAP Titration from 09-05-15 attached. We discussed goals, compliance, comfort and encouraged continued weight loss. CXR 04/09/2015 IMPRESSION: Probable bibasilar atelectasis although study is limited as described above and it is difficult to exclude pneumonia. Electronically Signed   By: Skipper Cliche M.D.   On: 04/09/2015 15:57  01/22/2016-56 year old male former smoker followed for COPD, hypoxic respiratory failure, OSA, complicated by morbid obesity, OHS, DM, CHF BiPAP 22/18/ PS 4 / APS   O2 2 L continuous FOLLOWS FOR: pt states he wears his BIPAP 3x weekly. pt states he's unable to wear cpap nightly, due to be unable to sleep on his back and having to go to the restroom alot during the time. pt states breathing has improved since last ov. pt c/o sob with exertion, prod cough tan in color & wheezing. Acute respiratory infection caught from children one or 2 weeks ago-he is uncle. Brown sputum. Trying to treat OTC. Unsure about fever. Feet swelling some. Frequent nocturia. Denies myalgias, chest pain/palpitation, GI upset Wearing O2 regularly. Asks work note  ROS-see HPI Constitutional:    weight loss, night sweats, fevers, chills, fatigue,  lassitude. HEENT:   No-  headaches, difficulty swallowing, tooth/dental problems, sore throat,       No-  sneezing, itching, ear ache, nasal congestion, post nasal drip,  CV:  No-   chest pain, +orthopnea, PND, +swelling in lower extremities, no-anasarca, dizziness, palpitations Resp: +shortness of breath with exertion or at rest.              +  productive cough,  + non-productive cough,  No- coughing up of blood.              No-   change in color of mucus.   wheezing.   Skin: No-   rash or lesions. GI:  No-   heartburn, indigestion, +abdominal pain,no- nausea, vomiting,  GU:  MS:  + joint pain or swelling.  . Neuro-     nothing unusual Psych:  No- change in mood or affect. No depression or anxiety.  No memory loss.  OBJ- Physical Exam General- Alert, Oriented, Affect-appropriate, Distress- none acute,  + morbidly obese, + portable O2                      + So fat that he has to sprawl back in chair to unload abd from diaphragm so he can breathe. Skin- rash-none, lesions- none, excoriation- none Lymphadenopathy- none Head- atraumatic            Eyes- Gross vision intact, PERRLA, conjunctivae and secretions clear            Ears- Hearing, canals-normal            Nose- Clear, no-Septal dev, mucus,  polyps, erosion, perforation             Throat- Mallampati IV , mucosa clear , drainage- none, tonsils- atrophic, + somewhat stridorous respiration                    + Shallow consistent with body habitus, normal voice quality Neck- +very thick , trachea midline,  Stridor-none , thyroid nl, carotid no bruit Chest - symmetrical excursion , unlabored           Heart/CV- RRR, distant , no murmur , no gallop  , no rub, nl s1 s2                           - JVD- none , edema+1, stasis changes- none, varices- none           Lung- + few rhonchi bases, wheeze- none, cough+ light , dullness-none, rub- none                                   Chest wall-  Abd-  Br/ Gen/ Rectal- Not done, not  indicated Extrem- cyanosis- none, clubbing, none, atrophy- none, strength- nl Neuro- grossly intact to observation

## 2016-01-22 NOTE — Assessment & Plan Note (Signed)
Emphasized importance of strict compliance with his BiPAP. He has OSA and OHS with chronic hypoxic respiratory failure and history of congestive heart failure. Reviewed goals and medical concerns. Discussed comfort measures. Plan-sleep in recliner if necessary for now so he can tolerate BiPAP.

## 2016-01-22 NOTE — Patient Instructions (Addendum)
Order- lab- CBC w diff, BMET, BNP     Dx acute bronchitis, chronic CHF  Neb xop 0.63    Dx acute bronchitis  Depo 80       It is really important that you wear your BIPAP all night every night, or the DME company will have to take it away from you.               Try sleeping in recliner if that helps.  Script sent for Zpak antibiotic to [facility]  Continue O2 2L continuously

## 2016-01-22 NOTE — Assessment & Plan Note (Signed)
Necessary to continue supplemental oxygen 2 L with BiPAP at night and continuously through the day. He came with it today.

## 2016-01-22 NOTE — Assessment & Plan Note (Signed)
Severely overweight. Discussed fluid retention versus overeating/inadequate exercise.

## 2016-01-22 NOTE — Assessment & Plan Note (Signed)
Acute bronchitis. I don't think he has a pneumonia at this point. Plan-nebulizer Xopenex, Depo-Medrol. Labs for CBC with differential chemistry, BNP.

## 2016-04-22 ENCOUNTER — Ambulatory Visit (INDEPENDENT_AMBULATORY_CARE_PROVIDER_SITE_OTHER): Payer: Medicaid Other | Admitting: Podiatry

## 2016-04-22 ENCOUNTER — Encounter: Payer: Self-pay | Admitting: Podiatry

## 2016-04-22 VITALS — BP 145/75 | HR 81 | Resp 18

## 2016-04-22 DIAGNOSIS — B351 Tinea unguium: Secondary | ICD-10-CM

## 2016-04-22 DIAGNOSIS — E1142 Type 2 diabetes mellitus with diabetic polyneuropathy: Secondary | ICD-10-CM

## 2016-04-22 DIAGNOSIS — M79676 Pain in unspecified toe(s): Secondary | ICD-10-CM

## 2016-04-22 NOTE — Progress Notes (Signed)
   Subjective:    Patient ID: Timothy Le, male    DOB: [birth year], 56 y.o.   MRN: [id number]  HPI this patient presents the office with chief complaint of long thick nails both feet. Patient states the nails are painful walking and wearing his shoes. He says he has diabetes mellitus. He presents the office today for an evaluation of his diabetic foot as well as preventative foot care services    Review of Systems  Constitutional: Positive for unexpected weight change.  HENT: Positive for sneezing and trouble swallowing.   Eyes: Positive for redness and itching.  Respiratory: Positive for apnea, cough, shortness of breath and wheezing.   Musculoskeletal: Positive for gait problem and myalgias.  Skin: Positive for rash.  All other systems reviewed and are negative.      Objective:   Physical Exam GENERAL APPEARANCE: Alert, conversant. Appropriately groomed. No acute distress.  VASCULAR: Pedal pulses are  palpable at  [facility] and PT bilateral.  Capillary refill time is immediate to all digits,  Normal temperature gradient.  Digital hair growth is present bilateral  NEUROLOGIC: sensation is normal to 5.07 monofilament at 5/5 sites bilateral.  Light touch is intact bilateral, Muscle strength normal.  MUSCULOSKELETAL: acceptable muscle strength, tone and stability bilateral.  Intrinsic muscluature intact bilateral.  HAV  B/L.  DERMATOLOGIC: skin color, texture, and turgor are within normal limits.  No preulcerative lesions or ulcers  are seen, no interdigital maceration noted.  No open lesions present.  . No drainage noted.  NAILS  Thick disfigured discolored nails both feet.         Assessment & Plan:  Pain due to onychomycosis  B/L.  DM  IE  Debridement and grinding of long painful nails  B/L. RTC 3 months.   Gardiner Barefoot DPM

## 2016-05-21 ENCOUNTER — Ambulatory Visit (INDEPENDENT_AMBULATORY_CARE_PROVIDER_SITE_OTHER)
Admission: RE | Admit: 2016-05-21 | Discharge: 2016-05-21 | Disposition: A | Discharge status: Home / Self Care | Payer: Medicaid Other | Source: Ambulatory Visit | Attending: Internal Medicine | Admitting: Internal Medicine

## 2016-05-21 ENCOUNTER — Ambulatory Visit (INDEPENDENT_AMBULATORY_CARE_PROVIDER_SITE_OTHER): Payer: Medicaid Other | Admitting: Internal Medicine

## 2016-05-21 VITALS — BP 130/78 | HR 94 | Resp 20 | Ht 68.0 in | Wt 370.0 lb

## 2016-05-21 DIAGNOSIS — J449 Chronic obstructive pulmonary disease, unspecified: Secondary | ICD-10-CM

## 2016-05-21 DIAGNOSIS — J3089 Other allergic rhinitis: Secondary | ICD-10-CM | POA: Diagnosis not present

## 2016-05-21 DIAGNOSIS — J302 Other seasonal allergic rhinitis: Secondary | ICD-10-CM | POA: Diagnosis not present

## 2016-05-21 DIAGNOSIS — R0600 Dyspnea, unspecified: Secondary | ICD-10-CM | POA: Diagnosis not present

## 2016-05-21 DIAGNOSIS — G4733 Obstructive sleep apnea (adult) (pediatric): Secondary | ICD-10-CM

## 2016-05-21 DIAGNOSIS — J9621 Acute and chronic respiratory failure with hypoxia: Secondary | ICD-10-CM

## 2016-05-21 MED ORDER — LORATADINE 10 MG PO TABS: ORAL_TABLET | ORAL | 5 refills | Status: DC

## 2016-05-21 NOTE — Patient Instructions (Addendum)
Script sent for loratadine antihistamine to take one a day as needed for allergy  Continue your oxygen 2L/ minute  Please try to use your BIPAP machine any time you sleep, as discussed.

## 2016-05-21 NOTE — Progress Notes (Signed)
HPI  M former smoker followed for COPD, OSA, OHS, complicated by morbid obesity PFT 09/21/13-severe obstruction with slight response to bronchodilator, restriction of exhaled volume, mild reduction diffusion NPSG 08/08/13- AHI 21.1/ hr, desaturation to 80%, body weight 385 lbs BIPAP titration 09/05/15 optimal pressure 22/18, body weight 365 lbs --------------------------------------------------------  01/22/2016-56 year old male former smoker followed for COPD, hypoxic respiratory failure, OSA, complicated by morbid obesity, OHS, DM, CHF BiPAP 22/18/ PS 4 / APS   O2 2 L continuous FOLLOWS FOR: pt states he wears his BIPAP 3x weekly. pt states he's unable to wear cpap nightly, due to be unable to sleep on his back and having to go to the restroom alot during the time. pt states breathing has improved since last ov. pt c/o sob with exertion, prod cough tan in color & wheezing. Acute respiratory infection caught from children one or 2 weeks ago-he is uncle. Brown sputum. Trying to treat OTC. Unsure about fever. Feet swelling some. Frequent nocturia. Denies myalgias, chest pain/palpitation, GI upset Wearing O2 regularly. Asks work note  05/21/16- 56 year old male former smoker followed for COPD, hypoxic respiratory failure, OSA, complicated by morbid obesity, OHS, DM, CHF BiPAP 22/18/ PS 4 / APS   O2 2 L continuous     Requalified today for O2  Not using BIPAP- DL shows no activity. follow up dme APS  at time wears his oxygen all the time at [facility]  He admits not taking medicines regularly, gaining weight again, not using BiPAP. Irregular, on discipline sleep schedule. Complains pollen is causing itching and sneezing.  ROS-see HPI    + = pos Constitutional:    weight loss, night sweats, fevers, chills, + fatigue, lassitude. HEENT:   No-  headaches, difficulty swallowing, tooth/dental problems, sore throat,     + sneezing, itching, ear ache, nasal congestion, post nasal drip,  CV:  No-   chest pain,  +orthopnea, PND, +swelling in lower extremities, no-anasarca, dizziness, palpitations Resp: +shortness of breath with exertion or at rest.              +  productive cough,  + non-productive cough,  No- coughing up of blood.              No-   change in color of mucus.   wheezing.   Skin: No-   rash or lesions. GI:  No-   heartburn, indigestion, +abdominal pain,no- nausea, vomiting,  GU:  MS:  + joint pain or swelling.  . Neuro-     nothing unusual Psych:  No- change in mood or affect. No depression or anxiety.  No memory loss.  OBJ- Physical Exam General- Alert, Oriented, Affect-appropriate, Distress- none acute,  + morbidly obese, + portable O2                      + So fat that he has to sprawl back in chair to unload abd from diaphragm so he can breathe. Skin- rash-none, lesions- none, excoriation- none Lymphadenopathy- none Head- atraumatic            Eyes- Gross vision intact, PERRLA, conjunctivae and secretions clear            Ears- Hearing, canals-normal            Nose- Clear, no-Septal dev, mucus, polyps, erosion, perforation             Throat- Mallampati IV , mucosa clear , drainage- none, tonsils- atrophic, + somewhat stridorous respiration                    +  Shallow consistent with body habitus, normal voice quality Neck- +very thick , trachea midline,  Stridor-none , thyroid nl, carotid no bruit Chest - symmetrical excursion , unlabored           Heart/CV- RRR, distant , no murmur , no gallop  , no rub, nl s1 s2                           - JVD- none , edema+2, stasis changes- none, varices- none           Lung- diminished/clear +, wheeze- none, cough-none , dullness-none, rub- none                                   Chest wall-  Abd-  Br/ Gen/ Rectal- Not done, not indicated Extrem- cyanosis- none, clubbing, none, atrophy- none, strength- nl Neuro- grossly intact to observation

## 2016-05-24 ENCOUNTER — Encounter: Payer: Self-pay | Admitting: Internal Medicine

## 2016-05-24 DIAGNOSIS — J302 Other seasonal allergic rhinitis: Secondary | ICD-10-CM | POA: Insufficient documentation

## 2016-05-24 DIAGNOSIS — J3089 Other allergic rhinitis: Secondary | ICD-10-CM

## 2016-05-24 NOTE — Assessment & Plan Note (Signed)
He describes symptoms consistent with allergic rhinitis at this time of year Plan-suggested OTC loratadine

## 2016-05-24 NOTE — Assessment & Plan Note (Signed)
Not making his full effort at weight loss. He did not follow-up with direction to bariatric program.

## 2016-05-24 NOTE — Assessment & Plan Note (Signed)
Not compliant with his BiPAP-discussed

## 2016-05-24 NOTE — Assessment & Plan Note (Signed)
  Obstructive and restrictive defects reflecting COPD, obesity hypoventilation Weight loss would help the most

## 2016-05-24 NOTE — Assessment & Plan Note (Signed)
He is oxygen dependent mostly on basis of obesity hypoventilation syndrome and probably some degree of chronic heart failure

## 2016-06-17 ENCOUNTER — Inpatient Hospital Stay (HOSPITAL_COMMUNITY)
Admission: EM | Admit: 2016-06-17 | Discharge: 2016-06-21 | DRG: 190 | Disposition: A | Discharge status: Home Health | Payer: Medicaid Other | Attending: Internal Medicine | Admitting: Internal Medicine

## 2016-06-17 ENCOUNTER — Emergency Department (HOSPITAL_COMMUNITY): Payer: Medicaid Other

## 2016-06-17 ENCOUNTER — Encounter (HOSPITAL_COMMUNITY): Payer: Self-pay

## 2016-06-17 DIAGNOSIS — J9622 Acute and chronic respiratory failure with hypercapnia: Secondary | ICD-10-CM

## 2016-06-17 DIAGNOSIS — J9621 Acute and chronic respiratory failure with hypoxia: Secondary | ICD-10-CM | POA: Diagnosis present

## 2016-06-17 DIAGNOSIS — Z794 Long term (current) use of insulin: Secondary | ICD-10-CM | POA: Diagnosis not present

## 2016-06-17 DIAGNOSIS — G4733 Obstructive sleep apnea (adult) (pediatric): Secondary | ICD-10-CM | POA: Diagnosis present

## 2016-06-17 DIAGNOSIS — J9601 Acute respiratory failure with hypoxia: Secondary | ICD-10-CM

## 2016-06-17 DIAGNOSIS — I110 Hypertensive heart disease with heart failure: Secondary | ICD-10-CM | POA: Diagnosis present

## 2016-06-17 DIAGNOSIS — E1142 Type 2 diabetes mellitus with diabetic polyneuropathy: Secondary | ICD-10-CM

## 2016-06-17 DIAGNOSIS — R609 Edema, unspecified: Secondary | ICD-10-CM | POA: Diagnosis not present

## 2016-06-17 DIAGNOSIS — I10 Essential (primary) hypertension: Secondary | ICD-10-CM | POA: Diagnosis present

## 2016-06-17 DIAGNOSIS — Z87891 Personal history of nicotine dependence: Secondary | ICD-10-CM

## 2016-06-17 DIAGNOSIS — E662 Morbid (severe) obesity with alveolar hypoventilation: Secondary | ICD-10-CM | POA: Diagnosis present

## 2016-06-17 DIAGNOSIS — Z7951 Long term (current) use of inhaled steroids: Secondary | ICD-10-CM

## 2016-06-17 DIAGNOSIS — J441 Chronic obstructive pulmonary disease with (acute) exacerbation: Secondary | ICD-10-CM | POA: Diagnosis not present

## 2016-06-17 DIAGNOSIS — I5033 Acute on chronic diastolic (congestive) heart failure: Secondary | ICD-10-CM | POA: Diagnosis present

## 2016-06-17 DIAGNOSIS — Z8249 Family history of ischemic heart disease and other diseases of the circulatory system: Secondary | ICD-10-CM

## 2016-06-17 DIAGNOSIS — E1100 Type 2 diabetes mellitus with hyperosmolarity without nonketotic hyperglycemic-hyperosmolar coma (NKHHC): Secondary | ICD-10-CM | POA: Diagnosis present

## 2016-06-17 DIAGNOSIS — E119 Type 2 diabetes mellitus without complications: Secondary | ICD-10-CM

## 2016-06-17 DIAGNOSIS — Z833 Family history of diabetes mellitus: Secondary | ICD-10-CM

## 2016-06-17 DIAGNOSIS — Z825 Family history of asthma and other chronic lower respiratory diseases: Secondary | ICD-10-CM

## 2016-06-17 DIAGNOSIS — E1165 Type 2 diabetes mellitus with hyperglycemia: Secondary | ICD-10-CM | POA: Diagnosis present

## 2016-06-17 DIAGNOSIS — R600 Localized edema: Secondary | ICD-10-CM | POA: Diagnosis present

## 2016-06-17 DIAGNOSIS — Z6841 Body Mass Index (BMI) 40.0 and over, adult: Secondary | ICD-10-CM

## 2016-06-17 DIAGNOSIS — K219 Gastro-esophageal reflux disease without esophagitis: Secondary | ICD-10-CM | POA: Diagnosis present

## 2016-06-17 DIAGNOSIS — Z9119 Patient's noncompliance with other medical treatment and regimen: Secondary | ICD-10-CM

## 2016-06-17 DIAGNOSIS — Z86718 Personal history of other venous thrombosis and embolism: Secondary | ICD-10-CM

## 2016-06-17 LAB — URINALYSIS, ROUTINE W REFLEX MICROSCOPIC
BILIRUBIN URINE: NEGATIVE
Bacteria, UA: NONE SEEN
Glucose, UA: NEGATIVE mg/dL
HGB URINE DIPSTICK: NEGATIVE
Ketones, ur: NEGATIVE mg/dL
Leukocytes, UA: NEGATIVE
Nitrite: NEGATIVE
PH: 5 (ref 5.0–8.0)
Protein, ur: 30 mg/dL — AB
SPECIFIC GRAVITY, URINE: 1.025 (ref 1.005–1.030)

## 2016-06-17 LAB — BASIC METABOLIC PANEL
Anion gap: 4 — ABNORMAL LOW (ref 5–15)
BUN: 16 mg/dL (ref 6–20)
CALCIUM: 8.8 mg/dL — AB (ref 8.9–10.3)
CHLORIDE: 96 mmol/L — AB (ref 101–111)
CO2: 44 mmol/L — ABNORMAL HIGH (ref 22–32)
CREATININE: 1.13 mg/dL (ref 0.61–1.24)
GFR calc non Af Amer: >60 mL/min (ref >60)
Glucose, Bld: 144 mg/dL — ABNORMAL HIGH (ref 65–99)
Potassium: 4.1 mmol/L (ref 3.5–5.1)
Sodium: 144 mmol/L (ref 135–145)

## 2016-06-17 LAB — BLOOD GAS, ARTERIAL
ACID-BASE EXCESS: 12.4 mmol/L — AB (ref 0.0–2.0)
Bicarbonate: 40.2 mmol/L — ABNORMAL HIGH (ref 20.0–28.0)
DRAWN BY: 11249
O2 Content: 2 L/min
O2 Saturation: 89.9 %
PATIENT TEMPERATURE: 98.1
PH ART: 7.379 (ref 7.350–7.450)
pCO2 arterial: 69.5 mmHg (ref 32.0–48.0)
pO2, Arterial: 57.5 mmHg — ABNORMAL LOW (ref 83.0–108.0)

## 2016-06-17 LAB — RAPID URINE DRUG SCREEN, HOSP PERFORMED
AMPHETAMINES: NOT DETECTED
BENZODIAZEPINES: NOT DETECTED
Barbiturates: NOT DETECTED
Cocaine: NOT DETECTED
OPIATES: NOT DETECTED
Tetrahydrocannabinol: POSITIVE — AB

## 2016-06-17 LAB — CBC
HCT: 44.5 % (ref 39.0–52.0)
Hemoglobin: 12.4 g/dL — ABNORMAL LOW (ref 13.0–17.0)
MCH: 26.3 pg (ref 26.0–34.0)
MCHC: 27.9 g/dL — ABNORMAL LOW (ref 30.0–36.0)
MCV: 94.5 fL (ref 78.0–100.0)
PLATELETS: 248 10*3/uL (ref 150–400)
RBC: 4.71 MIL/uL (ref 4.22–5.81)
RDW: 17.5 % — ABNORMAL HIGH (ref 11.5–15.5)
WBC: 7.4 10*3/uL (ref 4.0–10.5)

## 2016-06-17 LAB — GLUCOSE, CAPILLARY: Glucose-Capillary: 152 mg/dL — ABNORMAL HIGH (ref 65–99)

## 2016-06-17 LAB — EXPECTORATED SPUTUM ASSESSMENT W REFEX TO RESP CULTURE

## 2016-06-17 LAB — EXPECTORATED SPUTUM ASSESSMENT W GRAM STAIN, RFLX TO RESP C

## 2016-06-17 LAB — CBG MONITORING, ED: Glucose-Capillary: 131 mg/dL — ABNORMAL HIGH (ref 65–99)

## 2016-06-17 LAB — TROPONIN I

## 2016-06-17 LAB — BRAIN NATRIURETIC PEPTIDE: B Natriuretic Peptide: 22.6 pg/mL (ref 0.0–100.0)

## 2016-06-17 MED ORDER — BECLOMETHASONE DIPROPIONATE 40 MCG/ACT IN AERS: 2.0000 | INHALATION_SPRAY | Freq: Every day | RESPIRATORY_TRACT | Status: DC

## 2016-06-17 MED ORDER — ONDANSETRON HCL 4 MG/2ML IJ SOLN: 4.0000 mg | Freq: Four times a day (QID) | INTRAMUSCULAR | Status: DC | PRN

## 2016-06-17 MED ORDER — ENOXAPARIN SODIUM 80 MG/0.8ML ~~LOC~~ SOLN
80.0000 mg | Freq: Every day | SUBCUTANEOUS | Status: DC
  Administered 2016-06-17 – 2016-06-20 (×4): 80 mg via SUBCUTANEOUS
  Filled 2016-06-17 (×4): qty 0.8

## 2016-06-17 MED ORDER — SODIUM CHLORIDE 0.9 % IV SOLN
INTRAVENOUS | Status: DC
  Administered 2016-06-17: 16:00:00 via INTRAVENOUS

## 2016-06-17 MED ORDER — METHYLPREDNISOLONE SODIUM SUCC 125 MG IJ SOLR
125.0000 mg | Freq: Once | INTRAMUSCULAR | Status: AC
  Administered 2016-06-17: 125 mg via INTRAVENOUS
  Filled 2016-06-17: qty 2

## 2016-06-17 MED ORDER — ACETAMINOPHEN 650 MG RE SUPP: 650.0000 mg | Freq: Four times a day (QID) | RECTAL | Status: DC | PRN

## 2016-06-17 MED ORDER — ACETAMINOPHEN 325 MG PO TABS: 650.0000 mg | ORAL_TABLET | Freq: Four times a day (QID) | ORAL | Status: DC | PRN

## 2016-06-17 MED ORDER — ENOXAPARIN SODIUM 40 MG/0.4ML ~~LOC~~ SOLN: 40.0000 mg | SUBCUTANEOUS | Status: DC

## 2016-06-17 MED ORDER — LEVOFLOXACIN 750 MG PO TABS
750.0000 mg | ORAL_TABLET | Freq: Every day | ORAL | Status: DC
  Administered 2016-06-18 – 2016-06-21 (×4): 750 mg via ORAL
  Filled 2016-06-17 (×4): qty 1

## 2016-06-17 MED ORDER — METHYLPREDNISOLONE SODIUM SUCC 125 MG IJ SOLR
60.0000 mg | Freq: Four times a day (QID) | INTRAMUSCULAR | Status: DC
  Administered 2016-06-17 – 2016-06-19 (×7): 60 mg via INTRAVENOUS
  Filled 2016-06-17 (×7): qty 2

## 2016-06-17 MED ORDER — IPRATROPIUM-ALBUTEROL 0.5-2.5 (3) MG/3ML IN SOLN
3.0000 mL | RESPIRATORY_TRACT | Status: DC | PRN
  Filled 2016-06-17: qty 3

## 2016-06-17 MED ORDER — INSULIN GLARGINE 100 UNIT/ML ~~LOC~~ SOLN
60.0000 [IU] | SUBCUTANEOUS | Status: DC
  Administered 2016-06-18 – 2016-06-21 (×4): 60 [IU] via SUBCUTANEOUS
  Filled 2016-06-17 (×4): qty 0.6

## 2016-06-17 MED ORDER — INSULIN ASPART 100 UNIT/ML ~~LOC~~ SOLN
0.0000 [IU] | Freq: Three times a day (TID) | SUBCUTANEOUS | Status: DC
  Administered 2016-06-18: 3 [IU] via SUBCUTANEOUS
  Administered 2016-06-18: 4 [IU] via SUBCUTANEOUS
  Administered 2016-06-18: 3 [IU] via SUBCUTANEOUS
  Administered 2016-06-19 – 2016-06-20 (×4): 4 [IU] via SUBCUTANEOUS
  Administered 2016-06-20: 3 [IU] via SUBCUTANEOUS
  Administered 2016-06-20 – 2016-06-21 (×2): 4 [IU] via SUBCUTANEOUS
  Administered 2016-06-21 (×2): 3 [IU] via SUBCUTANEOUS

## 2016-06-17 MED ORDER — INSULIN ASPART 100 UNIT/ML ~~LOC~~ SOLN: 0.0000 [IU] | Freq: Every day | SUBCUTANEOUS | Status: DC

## 2016-06-17 MED ORDER — GUAIFENESIN ER 600 MG PO TB12
1200.0000 mg | ORAL_TABLET | Freq: Two times a day (BID) | ORAL | Status: DC
  Filled 2016-06-17: qty 2

## 2016-06-17 MED ORDER — ONDANSETRON HCL 4 MG PO TABS: 4.0000 mg | ORAL_TABLET | Freq: Four times a day (QID) | ORAL | Status: DC | PRN

## 2016-06-17 MED ORDER — POTASSIUM CHLORIDE ER 10 MEQ PO TBCR
10.0000 meq | EXTENDED_RELEASE_TABLET | Freq: Every day | ORAL | Status: DC
  Administered 2016-06-17 – 2016-06-20 (×4): 10 meq via ORAL
  Filled 2016-06-17 (×10): qty 1

## 2016-06-17 MED ORDER — LEVOFLOXACIN IN D5W 750 MG/150ML IV SOLN
750.0000 mg | Freq: Once | INTRAVENOUS | Status: AC
  Administered 2016-06-17: 750 mg via INTRAVENOUS
  Filled 2016-06-17: qty 150

## 2016-06-17 MED ORDER — MAGNESIUM SULFATE 2 GM/50ML IV SOLN
2.0000 g | Freq: Once | INTRAVENOUS | Status: AC
  Administered 2016-06-17: 2 g via INTRAVENOUS
  Filled 2016-06-17: qty 50

## 2016-06-17 MED ORDER — GEMFIBROZIL 600 MG PO TABS
600.0000 mg | ORAL_TABLET | Freq: Two times a day (BID) | ORAL | Status: DC
  Administered 2016-06-18 – 2016-06-21 (×8): 600 mg via ORAL
  Filled 2016-06-17 (×8): qty 1

## 2016-06-17 MED ORDER — GUAIFENESIN ER 600 MG PO TB12
600.0000 mg | ORAL_TABLET | Freq: Two times a day (BID) | ORAL | Status: DC
  Administered 2016-06-17 – 2016-06-21 (×8): 600 mg via ORAL
  Filled 2016-06-17 (×8): qty 1

## 2016-06-17 MED ORDER — BUDESONIDE 0.25 MG/2ML IN SUSP
0.2500 mg | Freq: Every day | RESPIRATORY_TRACT | Status: DC
  Administered 2016-06-18 – 2016-06-21 (×4): 0.25 mg via RESPIRATORY_TRACT
  Filled 2016-06-17 (×4): qty 2

## 2016-06-17 MED ORDER — IPRATROPIUM-ALBUTEROL 0.5-2.5 (3) MG/3ML IN SOLN
3.0000 mL | RESPIRATORY_TRACT | Status: DC
  Administered 2016-06-17 – 2016-06-20 (×12): 3 mL via RESPIRATORY_TRACT
  Filled 2016-06-17 (×11): qty 3

## 2016-06-17 MED ORDER — ALBUTEROL (5 MG/ML) CONTINUOUS INHALATION SOLN
15.0000 mg/h | INHALATION_SOLUTION | Freq: Once | RESPIRATORY_TRACT | Status: AC
  Administered 2016-06-17: 15 mg/h via RESPIRATORY_TRACT
  Filled 2016-06-17: qty 20

## 2016-06-17 MED ORDER — FUROSEMIDE 10 MG/ML IJ SOLN
40.0000 mg | Freq: Every day | INTRAMUSCULAR | Status: DC
  Administered 2016-06-17 – 2016-06-21 (×5): 40 mg via INTRAVENOUS
  Filled 2016-06-17 (×5): qty 4

## 2016-06-17 NOTE — ED Notes (Signed)
Bed: RESB Expected date:  Expected time:  Means of arrival:  Comments: EMS [facility]

## 2016-06-17 NOTE — H&P (Signed)
History and Physical    Timothy Le DOB: 1960/09/24 DOA: 06/17/2016  PCP: Kerin Perna, NP   Patient coming from: Home  Chief Complaint: Progressive shortness of breath x two weeks with leg swelling  HPI: Timothy Le is a 56 y.o. gentleman with a history of morbid obesity (BMI 22), COPD, former tobacco use, OSA (noncompliant with CPAP), obesity hypoventilation syndrome, asthma, HTN, and Type 2 DM who presents to the ED for evaluation of progressive shortness of breath for the past two weeks.  He reports subjective fevers.  He has had thick, tan sputum; no hemoptysis.  He has had cough and pleuritic chest pain.  He has had nausea but no vomiting.  He has had one episode of near-syncope.  He admits that he is noncompliant with CPAP (and BiPAP) at home.  He is followed by Dr. Baird Lyons (pulm); last seen in early May.  ED Course: The patient was very tachypnic initially.  Improved after continuous albuterol neb, mucinex 121m, solumedrol 1254mIV, magnesium 2g IV, and levaquin 75016mV.  Chest xray shows cardiomegaly and pulmonary vascular congestion.  Normal WBC count.  Hgb 12.  CO2 level 44.  BUN 16.  Creatinine 1.13.  BNP 22.  Troponin negative.   Hospitalist asked to admit.  Review of Systems: As per HPI otherwise 10 systems reviewed and negative.   Past Medical History:  Diagnosis Date  . Asthma   . COPD (chronic obstructive pulmonary disease) ([facility] . Diabetes mellitus without complication ([place] . Hypertension   . Shortness of breath dyspnea   . Sleep apnea     History reviewed. No pertinent surgical history.  He denies any major surgeries.   reports that he quit smoking about 3 years ago. His smoking use included Cigarettes. He started smoking about 32 years ago. He has a 0.25 pack-year smoking history. He has never used smokeless tobacco. He reports that he does not drink alcohol or use drugs.  He is married.  He has one son who is a high  schPublic affairs consultantraduates next week).  No Known Allergies  Family History  Problem Relation Age of Onset  . Asthma Sister   . Asthma Other        nephew  . Hypertension Sister   . Diabetes type II Sister      Prior to Admission medications   Medication Sig Start Date End Date Taking? Authorizing Provider  beclomethasone (QVAR) 40 MCG/ACT inhaler Inhale 2 puffs into the lungs daily. 07/18/13  Yes Piepenbrink, JenAnderson MaltaA-C  cholecalciferol (VITAMIN D) 1000 units tablet Take 1,000 Units by mouth daily.   Yes [provider]  fluticasone (FLONASE) 50 MCG/ACT nasal spray Place 2 sprays into both nostrils daily as needed for allergies.    Yes [provider]  furosemide (LASIX) 40 MG tablet Take 1 tablet (40 mg total) by mouth daily. 04/12/15  Yes Vann, Jessica U, DO  gemfibrozil (LOPID) 600 MG tablet Take 600 mg by mouth 2 (two) times daily before a meal. Thirty minutes before morning and evening meal.   Yes [provider]  insulin glargine (LANTUS) 100 UNIT/ML injection Inject 0.6 mLs (60 Units total) into the skin daily. 04/12/15  Yes VanGeradine GirtO  loratadine (CLARITIN) 10 MG tablet 1 daily for allergy while needed 05/21/16  Yes Young, Clinton D, MD  potassium chloride (K-DUR) 10 MEQ tablet Take 10 mEq by mouth daily.   Yes [provider]  triamcinolone cream (KENALOG) 0.1 % Apply 1 application topically daily.   Yes [provider]  blood glucose meter kit and supplies Dispense based on patient and insurance preference. Use up to four times daily as directed. (FOR ICD-9 250.00, 250.01). 03/29/15   [name], MD    Physical Exam: Vitals:   06/17/16 1721 06/17/16 1830 06/17/16 1844 06/17/16 2012  BP: 132/86 (!) 145/80 (!) 145/80 140/85  Pulse: 84 81 83 99  Resp: (!) 32 (!) 39 (!) 47 18  Temp:      TempSrc:      SpO2: 96% 96% 91% 93%  Weight:      Height:          Constitutional: NAD but labored breathing though he reports  improvement since arrival, calm, appropriate, he is not acutely decompensating. Vitals:   06/17/16 1721 06/17/16 1830 06/17/16 1844 06/17/16 2012  BP: 132/86 (!) 145/80 (!) 145/80 140/85  Pulse: 84 81 83 99  Resp: (!) 32 (!) 39 (!) 47 18  Temp:      TempSrc:      SpO2: 96% 96% 91% 93%  Weight:      Height:       Eyes: PERRL, lids and conjunctivae normal ENMT: Mucous membranes are slightly dry. Posterior pharynx clear not completely visualized.  Normal dentition.  Neck: very thick and short, supple, no masses Respiratory: Coarse bilaterally, obscured by upper airway noise, I cannot hear any wheezing.  Increased work of breathing but no accessory muscle use.  Cardiovascular: Normal rate, regular rhythm, no murmurs / rubs / gallops. 1+ pitting edema bilaterally.    GI: abdomen is super obese but compressible.  No tenderness.  Bowel sounds are present. Musculoskeletal:  No joint deformity in upper and lower extremities. No contractures. Normal muscle tone. Moves all four extremities spontaneously. Skin: Very dry, no rash Neurologic: CN 2-12 grossly intact. No focal weakness. Psychiatric: Normal judgment and insight. Alert and oriented x 3. Normal mood.     Labs on Admission: I have personally reviewed following labs and imaging studies  CBC:  Recent Labs Lab 06/17/16 1440  WBC 7.4  HGB 12.4*  HCT 44.5  MCV 94.5  PLT 191   Basic Metabolic Panel:  Recent Labs Lab 06/17/16 1440  NA 144  K 4.1  CL 96*  CO2 44*  GLUCOSE 144*  BUN 16  CREATININE 1.13  CALCIUM 8.8*   GFR: Estimated Creatinine Clearance: 111.8 mL/min (by C-G formula based on SCr of 1.13 mg/dL).  Cardiac Enzymes:  Recent Labs Lab 06/17/16 1440  TROPONINI <0.03   BNP (last 3 results)  Recent Labs  01/22/16 1227  PROBNP 26.0   CBG:  Recent Labs Lab 06/17/16 1446  GLUCAP 131*   Urine analysis: Pending   Radiological Exams on Admission: Dg Chest 2 View  Result Date:  06/17/2016 CLINICAL DATA:  Congestion for 2 weeks. EXAM: CHEST  2 VIEW COMPARISON:  PA and lateral chest 05/21/2016 and 04/09/2015. FINDINGS: There is cardiomegaly and vascular congestion. No consolidative process, pneumothorax or effusion. No acute bony abnormality. IMPRESSION: Cardiomegaly and pulmonary vascular congestion. Electronically Signed   By: Inge Rise M.D.   On: 06/17/2016 15:48    EKG: Independently reviewed. NSR.  Low voltage.  No acute ST segment changes.  Assessment/Plan Principal Problem:   COPD with acute exacerbation (HCC) Active Problems:   Obesity, morbid ([place])   Obstructive sleep apnea   Peripheral edema   Diabetes mellitus type 2, controlled ([place])  HTN (hypertension)      Acute on chronic respiratory failure, multifactorial, COPD, Obesity hypoventilation syndrome, OSA, morbid obesity with noncompliance with home BiPAP and CPAP.  I also suspect a component of CHF, volume overload based on physical exam (normal BNP noted). --Maintain O2 sats 92-94% for now; on 2L [place] at home; currently on 3L [place] in the ED with O2 sats 96% --Solumedrol 22m IV q6h --Continue empiric levaquin --Sputum culture pending --Duoneb q4h while awake and prn --Continue QVAR inhaler --Diurese with lasix 423mIV daily --Strict I/O --Daily weights --1800cc fluid restriction for now --Check echo in the AM --Serial troponin --CPAP qHS --ABG pending  OSA --Will try CPAP per RT in the hospital; patient has been noncompliant at home.  Type 2 Diabetes --Continue home dose of lantus --Aggressive SSI coverage AC/HS  HTN --Consider adding ARB to home regimen   DVT prophylaxis: Lovenox Code Status: FULL Family Communication: Patient alone in the ED at time of admission. Disposition Plan: Expect he will go home at discharge. Consults called: NONE Admission status: Place in observation with telemetry monitoring.   TIME SPENT: 60 minutes   CaEber JonesD [facility] Pager [phone] 7PM-7AM, please contact night-coverage [email or web address] Password [email or web address] University [facility] Inc5/30/2018, 8:53 PM

## 2016-06-17 NOTE — Progress Notes (Signed)
Rx Brief Lovenox note  WT=161 kg, CrCl~111 ml/min, BMI=54  Rx adjusted Lovenox to 80 mg daily in pt with BMI>30  Thanks Dorrene German 06/17/2016 10:24 PM

## 2016-06-17 NOTE — ED Triage Notes (Signed)
Pt. C/o cough/short of breath x 2 days. EMS gave Albuterol/atrovent neb. En route and were unable to obtain IV access. He arrives very short of breath and not dyspneic. Monitor shows nsr without ectopy. Pt. Is alert and oriented x 4 with clear speech.

## 2016-06-17 NOTE — ED Provider Notes (Signed)
[facility] DEPT Provider Note   CSN: [id number] Arrival date & time: 06/17/16  1409     History   Chief Complaint Chief Complaint  Patient presents with  . Shortness of Breath  . Weakness    HPI Timothy Le is a 56 y.o. male.  He presents for evaluation of shortness of breath, with cough, present for several days.  He has a history of DVT, COPD, sleep apnea and morbid obesity.  He has had trouble breathing with coughing for several days.  He is using home medications, for COPD, without relief.  He was treated by EMS with a albuterol and Atrovent nebulizer during transport.  He denies chest pain, weakness or dizziness.  He states he has been eating well.  There are no other known modifying factors.  HPI  Past Medical History:  Diagnosis Date  . Asthma   . COPD (chronic obstructive pulmonary disease) ([place])   . Diabetes mellitus without complication ([place])   . Hypertension   . Shortness of breath dyspnea   . Sleep apnea     Patient Active Problem List   Diagnosis Date Noted  . COPD with acute exacerbation ([place]) 06/17/2016  . Seasonal and perennial allergic rhinitis 05/24/2016  . AKI (acute kidney injury) ([place]) 04/08/2015  . Hypovolemic shock ([place]) 04/08/2015  . DKA, type 2 ([place]) 04/08/2015  . Sepsis ([place]) 04/08/2015  . Hyponatremia 04/08/2015  . DM (diabetes mellitus) ([place]) 03/28/2015  . HTN (hypertension) 03/28/2015  . Near syncope 03/28/2015  . Type 2 diabetes mellitus with hyperosmolar nonketotic hyperglycemia ([place]) 03/28/2015  . Hyperglycemia 03/27/2015  . Acute on chronic respiratory failure with hypoxemia ([place]) 03/27/2015  . Acute on chronic congestive heart failure ([place])   . COPD exacerbation ([place]) 02/07/2015  . Pressure ulcer 12/28/2014  . COPD mixed type ([place]) 12/27/2014  . Diabetes mellitus type 2, controlled ([place]) 12/27/2014  . Obesity, morbid ([place]) 07/29/2013  . Obstructive sleep apnea 07/29/2013  . Peripheral edema 07/29/2013    History  reviewed. No pertinent surgical history.     Home Medications    Prior to Admission medications   Medication Sig Start Date End Date Taking? Authorizing Provider  beclomethasone (QVAR) 40 MCG/ACT inhaler Inhale 2 puffs into the lungs daily. 07/18/13  Yes Piepenbrink, Anderson [place], PA-C  cholecalciferol (VITAMIN D) 1000 units tablet Take 1,000 Units by mouth daily.   Yes [provider]  fluticasone (FLONASE) 50 MCG/ACT nasal spray Place 2 sprays into both nostrils daily as needed for allergies.    Yes [provider]  furosemide (LASIX) 40 MG tablet Take 1 tablet (40 mg total) by mouth daily. 04/12/15  Yes Vann, Jessica U, DO  gemfibrozil (LOPID) 600 MG tablet Take 600 mg by mouth 2 (two) times daily before a meal. Thirty minutes before morning and evening meal.   Yes [provider]  insulin glargine (LANTUS) 100 UNIT/ML injection Inject 0.6 mLs (60 Units total) into the skin daily. 04/12/15  Yes Geradine Girt, DO  loratadine (CLARITIN) 10 MG tablet 1 daily for allergy while needed 05/21/16  Yes Young, Clinton D, MD  potassium chloride (K-DUR) 10 MEQ tablet Take 10 mEq by mouth daily.   Yes [provider]  triamcinolone cream (KENALOG) 0.1 % Apply 1 application topically daily.   Yes [provider]  blood glucose meter kit and supplies Dispense based on patient and insurance preference. Use up to four times daily as directed. (FOR ICD-9 250.00, 250.01). 03/29/15   [name], MD  Family History Family History  Problem Relation Age of Onset  . Asthma Sister   . Asthma Other        nephew  . Hypertension Sister   . Diabetes type II Sister     Social History Social History  Substance Use Topics  . Smoking status: Former Smoker    Packs/day: 0.50    Years: 0.50    Types: Cigarettes    Start date: 07/28/1983    Quit date: 02/27/2013  . Smokeless tobacco: Never Used  . Alcohol use No     Comment: quit ETOH about 5 months ago-beer and liquor      Allergies   Patient has no known allergies.   Review of Systems Review of Systems  All other systems reviewed and are negative.    Physical Exam Updated Vital Signs BP (!) 165/95 (BP Location: Right Wrist)   Pulse 86   Temp 98.5 F (36.9 C) (Oral)   Resp 20   Ht '5\' 8"'  (1.727 m)   Wt (!) 161.9 kg (357 lb)   SpO2 93%   BMI 54.28 kg/m   Physical Exam  Constitutional: He is oriented to person, place, and time. He appears well-developed.  Morbid obesity  HENT:  Head: Normocephalic and atraumatic.  Right Ear: External ear normal.  Left Ear: External ear normal.  Eyes: Conjunctivae and EOM are normal. Pupils are equal, round, and reactive to light.  Neck: Normal range of motion and phonation normal. Neck supple.  Cardiovascular: Normal rate, regular rhythm and normal heart sounds.   Pulmonary/Chest: Effort normal. He exhibits no bony tenderness.  Persistent cough, pending difficulty producing sputum with cough, cough is very rhonchorous sounding.  No increased work of breathing.  No respiratory distress.  Very poor air movement bilaterally.  However, his marked obesity makes auscultation difficult.  Abdominal: Soft. There is no tenderness.  Musculoskeletal: Normal range of motion.  Neurological: He is alert and oriented to person, place, and time. No cranial nerve deficit or sensory deficit. He exhibits normal muscle tone. Coordination normal.  Skin: Skin is warm, dry and intact.  Psychiatric: He has a normal mood and affect. His behavior is normal. Judgment and thought content normal.  Nursing note and vitals reviewed.    ED Treatments / Results  Labs (all labs ordered are listed, but only abnormal results are displayed) Labs Reviewed  BASIC METABOLIC PANEL - Abnormal; Notable for the following:       Result Value   Chloride 96 (*)    CO2 44 (*)    Glucose, Bld 144 (*)    Calcium 8.8 (*)    Anion gap 4 (*)    All other components within normal limits  CBC -  Abnormal; Notable for the following:    Hemoglobin 12.4 (*)    MCHC 27.9 (*)    RDW 17.5 (*)    All other components within normal limits  URINALYSIS, ROUTINE W REFLEX MICROSCOPIC - Abnormal; Notable for the following:    Protein, ur 30 (*)    Squamous Epithelial / LPF 0-5 (*)    All other components within normal limits  RAPID URINE DRUG SCREEN, HOSP PERFORMED - Abnormal; Notable for the following:    Tetrahydrocannabinol POSITIVE (*)    All other components within normal limits  BLOOD GAS, ARTERIAL - Abnormal; Notable for the following:    pCO2 arterial 69.5 (*)    pO2, Arterial 57.5 (*)    Bicarbonate 40.2 (*)    Acid-Base  Excess 12.4 (*)    All other components within normal limits  GLUCOSE, CAPILLARY - Abnormal; Notable for the following:    Glucose-Capillary 152 (*)    All other components within normal limits  CBG MONITORING, ED - Abnormal; Notable for the following:    Glucose-Capillary 131 (*)    All other components within normal limits  CULTURE, EXPECTORATED SPUTUM-ASSESSMENT  CULTURE, RESPIRATORY (NON-EXPECTORATED)  BRAIN NATRIURETIC PEPTIDE  TROPONIN I  BASIC METABOLIC PANEL  CBC  TROPONIN I  TROPONIN I  TROPONIN I    EKG  EKG Interpretation  Date/Time:  Wednesday Jun 17 2016 14:20:29 EDT Ventricular Rate:  88 PR Interval:    QRS Duration: 96 QT Interval:  376 QTC Calculation: 455 R Axis:   114 Text Interpretation:  Sinus rhythm Left posterior fascicular block Low voltage, precordial leads since last tracing no significant change Confirmed by Daleen Bo [phone]) on 06/17/2016 3:35:14 PM       Radiology Dg Chest 2 View  Result Date: 06/17/2016 CLINICAL DATA:  Congestion for 2 weeks. EXAM: CHEST  2 VIEW COMPARISON:  PA and lateral chest 05/21/2016 and 04/09/2015. FINDINGS: There is cardiomegaly and vascular congestion. No consolidative process, pneumothorax or effusion. No acute bony abnormality. IMPRESSION: Cardiomegaly and pulmonary vascular  congestion. Electronically Signed   By: Inge Rise M.D.   On: 06/17/2016 15:48    Procedures Procedures (including critical care time)  Medications Ordered in ED Medications  guaiFENesin (MUCINEX) 12 hr tablet 600 mg (not administered)  insulin glargine (LANTUS) injection 60 Units (not administered)  potassium chloride (K-DUR) CR tablet 10 mEq (not administered)  gemfibrozil (LOPID) tablet 600 mg (not administered)  acetaminophen (TYLENOL) tablet 650 mg (not administered)    Or  acetaminophen (TYLENOL) suppository 650 mg (not administered)  ondansetron (ZOFRAN) tablet 4 mg (not administered)    Or  ondansetron (ZOFRAN) injection 4 mg (not administered)  insulin aspart (novoLOG) injection 0-20 Units (not administered)  insulin aspart (novoLOG) injection 0-5 Units (not administered)  furosemide (LASIX) injection 40 mg (not administered)  levofloxacin (LEVAQUIN) tablet 750 mg (not administered)  ipratropium-albuterol (DUONEB) 0.5-2.5 (3) MG/3ML nebulizer solution 3 mL (3 mLs Nebulization Given 06/17/16 2207)  ipratropium-albuterol (DUONEB) 0.5-2.5 (3) MG/3ML nebulizer solution 3 mL (not administered)  methylPREDNISolone sodium succinate (SOLU-MEDROL) 125 mg/2 mL injection 60 mg (not administered)  enoxaparin (LOVENOX) injection 80 mg (not administered)  budesonide (PULMICORT) nebulizer solution 0.25 mg (not administered)  methylPREDNISolone sodium succinate (SOLU-MEDROL) 125 mg/2 mL injection 125 mg (125 mg Intravenous Given 06/17/16 1558)  magnesium sulfate IVPB 2 g 50 mL (0 g Intravenous Stopped 06/17/16 1707)  albuterol (PROVENTIL,VENTOLIN) solution continuous neb (15 mg/hr Nebulization Given 06/17/16 1553)  levofloxacin (LEVAQUIN) IVPB 750 mg (750 mg Intravenous Transfusing/Transfer 06/17/16 2216)     Initial Impression / Assessment and Plan / ED Course  I have reviewed the triage vital signs and the nursing notes.  Pertinent labs & imaging results that were available during my  care of the patient were reviewed by me and considered in my medical decision making (see chart for details).      Patient Vitals for the past 24 hrs:  BP Temp Temp src Pulse Resp SpO2 Height Weight  06/17/16 2250 (!) 165/95 98.5 F (36.9 C) Oral 86 20 93 % '5\' 8"'  (1.727 m) -  06/17/16 2100 (!) 168/79 - - 81 - 96 % - -  06/17/16 2012 140/85 - - 99 18 93 % - -  06/17/16 2000 140/85 - -  99 - 94 % - -  06/17/16 1917 - - - - - 97 % - -  06/17/16 1900 (!) 146/73 - - 82 (!) 36 91 % - -  06/17/16 1844 (!) 145/80 - - 83 (!) 47 91 % - -  06/17/16 1830 (!) 145/80 - - 81 (!) 39 96 % - -  06/17/16 1721 132/86 - - 84 (!) 32 96 % - -  06/17/16 1615 - - - 62 - 94 % - -  06/17/16 1600 (!) 112/92 - - 77 - 97 % - -  06/17/16 1515 136/71 - - 77 (!) 24 (!) 88 % - -  06/17/16 1500 (!) 147/74 - - 69 (!) 6 94 % - -  06/17/16 1445 (!) 147/79 - - 85 11 96 % - -  06/17/16 1430 (!) 169/70 - - 91 12 (!) 89 % - -  06/17/16 1425 - - - - - - '5\' 8"'  (1.727 m) -  06/17/16 1423 (!) 155/66 98.2 F (36.8 C) Oral 84 (!) 24 100 % '5\' 8"'  (1.727 m) (!) 161.9 kg (357 lb)    At Disposition- reevaluation with update and discussion. After initial assessment and treatment, an updated evaluation reveals he remains tachycardic and tachypneic.  Findings discussed with the patient and all questions answered. Tynia Wiers L    Discussed with hospitalist to arrange admission  CRITICAL CARE Performed by: Daleen Bo L Total critical care time: 40 minutes Critical care time was exclusive of separately billable procedures and treating other patients. Critical care was necessary to treat or prevent imminent or life-threatening deterioration. Critical care was time spent personally by me on the following activities: development of treatment plan with patient and/or surrogate as well as nursing, discussions with consultants, evaluation of patient's response to treatment, examination of patient, obtaining history from patient or  surrogate, ordering and performing treatments and interventions, ordering and review of laboratory studies, ordering and review of radiographic studies, pulse oximetry and re-evaluation of patient's condition.  Final Clinical Impressions(s) / ED Diagnoses   Final diagnoses:  COPD exacerbation ([place])  Acute respiratory failure with hypoxia ([facility])    Evaluation consistent with COPD exacerbation.  She is morbidly obese.  Doubt pneumonia, metabolic instability or impending vascular collapse.  Doubt PE, but difficult to exclude.  Nursing Notes Reviewed/ Care Coordinated Applicable Imaging Reviewed Interpretation of Laboratory Data incorporated into ED treatment  Plan: [place] Prescriptions Current Discharge Medication List       Daleen Bo, MD 06/17/16 2310

## 2016-06-17 NOTE — ED Notes (Signed)
Patient states he uses a CPAP when sleeping.

## 2016-06-18 ENCOUNTER — Observation Stay (HOSPITAL_COMMUNITY): Payer: Medicaid Other

## 2016-06-18 DIAGNOSIS — J9601 Acute respiratory failure with hypoxia: Secondary | ICD-10-CM | POA: Diagnosis not present

## 2016-06-18 DIAGNOSIS — I110 Hypertensive heart disease with heart failure: Secondary | ICD-10-CM | POA: Diagnosis present

## 2016-06-18 DIAGNOSIS — K219 Gastro-esophageal reflux disease without esophagitis: Secondary | ICD-10-CM | POA: Diagnosis present

## 2016-06-18 DIAGNOSIS — Z86718 Personal history of other venous thrombosis and embolism: Secondary | ICD-10-CM | POA: Diagnosis not present

## 2016-06-18 DIAGNOSIS — Z87891 Personal history of nicotine dependence: Secondary | ICD-10-CM | POA: Diagnosis not present

## 2016-06-18 DIAGNOSIS — I10 Essential (primary) hypertension: Secondary | ICD-10-CM | POA: Diagnosis not present

## 2016-06-18 DIAGNOSIS — Z833 Family history of diabetes mellitus: Secondary | ICD-10-CM | POA: Diagnosis not present

## 2016-06-18 DIAGNOSIS — G4733 Obstructive sleep apnea (adult) (pediatric): Secondary | ICD-10-CM

## 2016-06-18 DIAGNOSIS — Z794 Long term (current) use of insulin: Secondary | ICD-10-CM | POA: Diagnosis not present

## 2016-06-18 DIAGNOSIS — J441 Chronic obstructive pulmonary disease with (acute) exacerbation: Principal | ICD-10-CM

## 2016-06-18 DIAGNOSIS — Z9119 Patient's noncompliance with other medical treatment and regimen: Secondary | ICD-10-CM | POA: Diagnosis not present

## 2016-06-18 DIAGNOSIS — I5033 Acute on chronic diastolic (congestive) heart failure: Secondary | ICD-10-CM

## 2016-06-18 DIAGNOSIS — Z8249 Family history of ischemic heart disease and other diseases of the circulatory system: Secondary | ICD-10-CM | POA: Diagnosis not present

## 2016-06-18 DIAGNOSIS — E119 Type 2 diabetes mellitus without complications: Secondary | ICD-10-CM | POA: Diagnosis not present

## 2016-06-18 DIAGNOSIS — Z7951 Long term (current) use of inhaled steroids: Secondary | ICD-10-CM | POA: Diagnosis not present

## 2016-06-18 DIAGNOSIS — Z6841 Body Mass Index (BMI) 40.0 and over, adult: Secondary | ICD-10-CM | POA: Diagnosis not present

## 2016-06-18 DIAGNOSIS — R0602 Shortness of breath: Secondary | ICD-10-CM | POA: Diagnosis present

## 2016-06-18 DIAGNOSIS — E1165 Type 2 diabetes mellitus with hyperglycemia: Secondary | ICD-10-CM | POA: Diagnosis present

## 2016-06-18 DIAGNOSIS — J9611 Chronic respiratory failure with hypoxia: Secondary | ICD-10-CM | POA: Diagnosis not present

## 2016-06-18 DIAGNOSIS — Z825 Family history of asthma and other chronic lower respiratory diseases: Secondary | ICD-10-CM | POA: Diagnosis not present

## 2016-06-18 DIAGNOSIS — J9621 Acute and chronic respiratory failure with hypoxia: Secondary | ICD-10-CM | POA: Diagnosis present

## 2016-06-18 DIAGNOSIS — E662 Morbid (severe) obesity with alveolar hypoventilation: Secondary | ICD-10-CM | POA: Diagnosis present

## 2016-06-18 LAB — BASIC METABOLIC PANEL
ANION GAP: 9 (ref 5–15)
BUN: 20 mg/dL (ref 6–20)
CALCIUM: 8.9 mg/dL (ref 8.9–10.3)
CO2: 40 mmol/L — AB (ref 22–32)
Chloride: 93 mmol/L — ABNORMAL LOW (ref 101–111)
Creatinine, Ser: 1.11 mg/dL (ref 0.61–1.24)
GFR calc non Af Amer: >60 mL/min (ref >60)
Glucose, Bld: 150 mg/dL — ABNORMAL HIGH (ref 65–99)
Potassium: 4.8 mmol/L (ref 3.5–5.1)
Sodium: 142 mmol/L (ref 135–145)

## 2016-06-18 LAB — ECHOCARDIOGRAM LIMITED
Height: 68 in
Weight: 5788.4 oz

## 2016-06-18 LAB — GLUCOSE, CAPILLARY
GLUCOSE-CAPILLARY: 137 mg/dL — AB (ref 65–99)
GLUCOSE-CAPILLARY: 146 mg/dL — AB (ref 65–99)
Glucose-Capillary: 124 mg/dL — ABNORMAL HIGH (ref 65–99)
Glucose-Capillary: 162 mg/dL — ABNORMAL HIGH (ref 65–99)

## 2016-06-18 LAB — CBC
HCT: 43.4 % (ref 39.0–52.0)
HEMOGLOBIN: 12.4 g/dL — AB (ref 13.0–17.0)
MCH: 26.3 pg (ref 26.0–34.0)
MCHC: 28.6 g/dL — ABNORMAL LOW (ref 30.0–36.0)
MCV: 92.1 fL (ref 78.0–100.0)
Platelets: 241 10*3/uL (ref 150–400)
RBC: 4.71 MIL/uL (ref 4.22–5.81)
RDW: 17.7 % — ABNORMAL HIGH (ref 11.5–15.5)
WBC: 7.5 10*3/uL (ref 4.0–10.5)

## 2016-06-18 LAB — TROPONIN I

## 2016-06-18 MED ORDER — PERFLUTREN LIPID MICROSPHERE
INTRAVENOUS | Status: AC
  Filled 2016-06-18: qty 10

## 2016-06-18 MED ORDER — PERFLUTREN LIPID MICROSPHERE
1.0000 mL | INTRAVENOUS | Status: AC | PRN
  Administered 2016-06-18: 3 mL via INTRAVENOUS
  Filled 2016-06-18: qty 10

## 2016-06-18 MED ORDER — PANTOPRAZOLE SODIUM 40 MG PO TBEC
40.0000 mg | DELAYED_RELEASE_TABLET | Freq: Every day | ORAL | Status: DC
Start: 2016-06-19 — End: 2016-06-21
  Administered 2016-06-19 – 2016-06-21 (×3): 40 mg via ORAL
  Filled 2016-06-18 (×3): qty 1

## 2016-06-18 NOTE — Evaluation (Addendum)
Physical Therapy Evaluation Patient Details Name: Timothy Le MRN: [id number] DOB: Jun 30, 1960 Today's Date: 06/18/2016   History of Present Illness  56 y.o. gentleman with a history of morbid obesity (BMI 61), COPD, former tobacco use, OSA (noncompliant with CPAP), obesity hypoventilation syndrome, asthma, HTN, and Type 2 DM who presents to the ED for evaluation of progressive shortness of breath for the past two weeks.  He reports subjective fevers.  He has had thick, tan sputum; no hemoptysis.  He has had cough and pleuritic chest pain.  He has had nausea but no vomiting.  He has had one episode of near-syncope.  He admits that he is noncompliant with CPAP (and BiPAP) at home.   Clinical Impression  Pt admitted with above diagnosis. Pt currently with functional limitations due to the deficits listed below (see PT Problem List). Pt ambulated 4' with RW, SaO2 87% on 2L O2 walking, distance limited by 3/4 dyspnea. He has 14 stairs to enter his apartment, he reports he's speaking with his case worker to request an apartment without stairs. Pt will benefit from skilled PT to increase their independence and safety with mobility to allow discharge to the venue listed below.       Follow Up Recommendations Home health PT    Equipment Recommendations  Rolling walker with 5" wheels (bariatric RW)    Recommendations for Other Services       Precautions / Restrictions Precautions Precautions: Fall Precaution Comments: monitor O2 Restrictions Weight Bearing Restrictions: No      Mobility  Bed Mobility Overal bed mobility: Modified Independent             General bed mobility comments: HOB up  Transfers Overall transfer level: Modified independent Equipment used: Rolling walker (2 wheeled) Transfers: Sit to/from Stand Sit to Stand: Modified independent (Device/Increase time)         General transfer comment: VCs hand placement  Ambulation/Gait Ambulation/Gait assistance:  Supervision Ambulation Distance (Feet): 75 Feet Assistive device: Rolling walker (2 wheeled) Gait Pattern/deviations: Step-through pattern;Decreased step length - right;Decreased step length - left   Gait velocity interpretation: Below normal speed for age/gender General Gait Details: steady with RW, no LOB, distance limited by 3/4 dyspnea, SaO2 87% on 2L O2 walking, VCs for pursed lip breathing, pt tends to mouth breathe  Stairs            Wheelchair Mobility    Modified Rankin (Stroke Patients Only)       Balance Overall balance assessment: Modified Independent                                           Pertinent Vitals/Pain Pain Assessment: No/denies pain    Home Living Family/patient expects to be discharged to:: Private residence Living Arrangements: Alone   Type of Home: Apartment Home Access: Stairs to enter Entrance Stairs-Rails: Right Entrance Stairs-Number of Steps: 14 -pt reports he's talking to his case worker about moving to apt without stairs Home Layout: One level Home Equipment: None;Other (comment) Additional Comments: 2L O2 at home    Prior Function Level of Independence: Independent         Comments: reports flight of stairs to his apartment is very challenging, uses [employer] for transportation, reports he has no assistance from family     Hand Dominance        Extremity/Trunk Assessment   Upper Extremity  Assessment Upper Extremity Assessment: Overall WFL for tasks assessed    Lower Extremity Assessment Lower Extremity Assessment: Overall WFL for tasks assessed    Cervical / Trunk Assessment Cervical / Trunk Assessment: Normal  Communication   Communication: No difficulties  Cognition Arousal/Alertness: Awake/alert Behavior During Therapy: WFL for tasks assessed/performed Overall Cognitive Status: Within Functional Limits for tasks assessed                                        General  Comments      Exercises     Assessment/Plan    PT Assessment Patient needs continued PT services  PT Problem List Cardiopulmonary status limiting activity;Decreased activity tolerance;Decreased mobility       PT Treatment Interventions DME instruction;Gait training;Functional mobility training;Therapeutic exercise;Therapeutic activities;Patient/family education    PT Goals (Current goals can be found in the Care Plan section)  Acute Rehab PT Goals Patient Stated Goal: to be able to walk farther PT Goal Formulation: With patient Time For Goal Achievement: 07/02/16 Potential to Achieve Goals: Good    Frequency Min 3X/week   Barriers to discharge        Co-evaluation               AM-PAC PT "6 Clicks" Daily Activity  Outcome Measure Difficulty turning over in bed (including adjusting bedclothes, sheets and blankets)?: A Little Difficulty moving from lying on back to sitting on the side of the bed? : A Little Difficulty sitting down on and standing up from a chair with arms (e.g., wheelchair, bedside commode, etc,.)?: A Little Help needed moving to and from a bed to chair (including a wheelchair)?: A Little Help needed walking in hospital room?: A Little Help needed climbing 3-5 steps with a railing? : A Lot 6 Click Score: 17    End of Session Equipment Utilized During Treatment: Oxygen Activity Tolerance: Patient limited by fatigue Patient left: in chair;with call bell/phone within reach Nurse Communication: Mobility status PT Visit Diagnosis: Difficulty in walking, not elsewhere classified (R26.2)    Time: 4158-3094 PT Time Calculation (min) (ACUTE ONLY): 19 min   Charges:   PT Evaluation $PT Eval Low Complexity: 1 Procedure     PT G Codes:   PT G-Codes **NOT FOR INPATIENT CLASS** Functional Assessment Tool Used: AM-PAC 6 Clicks Basic Mobility Functional Limitation: Mobility: Walking and moving around Mobility: Walking and Moving Around Current Status  (M7680): At least 40 percent but less than 60 percent impaired, limited or restricted Mobility: Walking and Moving Around Goal Status [phone]): At least 20 percent but less than 40 percent impaired, limited or restricted      Philomena Doheny 06/18/2016, 1:51 PM [phone]

## 2016-06-18 NOTE — Progress Notes (Signed)
Nutrition Brief Note  Patient identified on the Malnutrition Screening Tool (MST) Report  Wt stable. Pt eating 100% of meals and states he was eating well PTA.  Wt Readings from Last 15 Encounters:  06/18/16 (!) 361 lb 12.4 oz (164.1 kg)  05/21/16 (!) 370 lb (167.8 kg)  01/22/16 (!) 358 lb (162.4 kg)  10/14/15 (!) 353 lb (160.1 kg)  09/05/15 (!) 365 lb (165.6 kg)  07/11/15 (!) 365 lb 6.4 oz (165.7 kg)  04/12/15 (!) 367 lb (166.5 kg)  03/28/15 (!) 382 lb 4.4 oz (173.4 kg)  03/11/15 (!) 400 lb 4.8 oz (181.6 kg)  02/10/15 (!) 389 lb 1.6 oz (176.5 kg)  12/30/14 (!) 387 lb 12.6 oz (175.9 kg)  09/06/14 (!) 381 lb (172.8 kg)  06/04/14 (!) 395 lb (179.2 kg)  03/29/14 (!) 397 lb 12.8 oz (180.4 kg)  11/13/13 (!) 406 lb 6.4 oz (184.3 kg)    Body mass index is 55.01 kg/m. Patient meets criteria for morbid obesity based on current BMI.   Current diet order is Heart Healthy/CHO modified, patient is consuming approximately 100% of meals at this time. Labs and medications reviewed.   No nutrition interventions warranted at this time. If nutrition issues arise, please consult RD.   Timothy Bibles, MS, RD, LDN Pager: [phone] After Hours Pager: [phone]

## 2016-06-18 NOTE — Care Management Note (Signed)
Case Management Note  Patient Details  Name: XXAVIER NOON MRN: [id number] Date of Birth: 1960/06/28  Subjective/Objective:  [facility] chosen for [employer] aware & followingHHRN/PT/social worker-ordered, also home bariatric rw ordered-to be delivered to rm, or delivered to home  prior d/c(per [facility] recc) they will f/u home 02 also-travel tank to hospital & tanks/regulator for home.                 Action/Plan:d/c home w/HHC/dme   Expected Discharge Date:   (unknown)               Expected Discharge Plan:  [place]  In-House Referral:     Discharge planning Services  CM Consult  Post Acute Care Choice:  Durable Medical Equipment [facility] home [phone]) Choice offered to:  Patient  DME Arranged:  Walker rolling DME Agency:  [place]:  RN, PT, Social Work [employer] Agency:  [place]  Status of Service:  In process, will continue to follow  If discussed at Long Length of Stay Meetings, dates discussed:    Additional Comments:  Dessa Phi, RN 06/18/2016, 2:11 PM

## 2016-06-18 NOTE — Progress Notes (Signed)
RT placed patient on BIPAP per note in chart. BIPAP setting is 22/18 per note in chart. 2 liters oxygen bleed into hose. Patient seems to be tolerating well.

## 2016-06-18 NOTE — Progress Notes (Signed)
[facility] PROGRESS NOTE  Timothy Le GDJ:[id number] DOB: Dec 19, 1960 DOA: 06/17/2016 PCP: Kerin Perna, NP  Interim summary and HPI 56 year old male with a past medical history significant for morbid obesity, chronic respiratory failure, COPD, obstructive sleep apnea and diabetes; who presented with over 2 weeks of worsening shortness of breath wheezing and orthopnea. Patient also endorses increased lower extremity edema. Found to have acute exacerbation of heart failure along with COPD exacerbation. ABG done in the ED also demonstrated hypercapnia (most likely chronic retention from obstructive sleep apnea in a patient that is noncompliant with his CPAP)   Assessment/Plan: 1-acute on chronic resp failure: Multifactorial. Appears to be due to a combination of COPD exacerbation along with CHF exacerbation. Patient also with obesity hypoventilation syndrome and obstructive sleep apnea (noncompliant with CPAP at home). -continue solumedrol, abx's and nebulizer treatment -will continue IV diuresis and follow volume status -daily weight, strict intake/output and low sodium diet -patient educated on need for CPAP -continue oxygen supplementation and follow clinical response; will weaned as tolerated. At home he uses 2L.  2-acute on chronic HF: appears to be diastolic in nature -echo has been repeated and results are pending  -continue diuresis -follow daily weights and strict I's and O's -troponin neg X3 and no CP  3-type 2 diabetes -will continue SSI and lantus  4-HTN: was not on any antihypertensive regimen at home -will monitor and start BP meds if needed  -Continue Lasix and low sodium diet for now  5-morbid obesity: With associated obstructive sleep apnea and hypo-ventilation syndrome -continue CPAP QHS -Low calorie diet and increase physical activity discussed with patient  6-GERD -will continue PPI   Code Status: Full Family Communication: no family at  bedside  Disposition Plan: remains inpatient, continue oxygen supplementation and weaned as tolerated; will continue IV diuresis and treatment for COPD exacerbation with solumedrol, nebulization and abx's.   Consultants:  None   Procedures:  2-D echo: pending   See below for x-ray reports   Antibiotics:  levaquin 5/30  HPI/Subjective: Afebrile, requiring oxygen supplementation (3L now); unable to speak in full sentences and with signs of fluid overload. Denies CP.  Objective: Vitals:   06/18/16 1207 06/18/16 1424  BP:  126/79  Pulse:  96  Resp:  20  Temp: 98.2 F (36.8 C) 98.5 F (36.9 C)    Intake/Output Summary (Last 24 hours) at 06/18/16 1759 Last data filed at 06/18/16 1642  Gross per 24 hour  Intake              600 ml  Output             3100 ml  Net            -2500 ml   Filed Weights   06/17/16 1423 06/18/16 0519  Weight: (!) 161.9 kg (357 lb) (!) 164.1 kg (361 lb 12.4 oz)    Exam:   General:  Afebrile, complaining of shortness of breath him a coughing spells, dyspnea, still with signs of fluid overload on exam. Patient requiring 3 L of nasal cannula supplementation. Patient with difficulty speaking in full sentences.  Cardiovascular: No rubs, no gallops, RRR. JVD unable to properly assess secondary to body habitus.  Respiratory: Positive expiratory wheezing, fair air movement; bibasilar crackles appreciated on exam and positive tachypnea.  Abdomen: Obese, Soft, nontender, nondistended, positive bowel sounds.  Musculoskeletal: 1-2+ edema bilaterally up to his thighs, no cyanosis, no clubbing   Data Reviewed: Basic Metabolic Panel:  Recent Labs  Lab 06/17/16 1440 06/18/16 0500  NA 144 142  K 4.1 4.8  CL 96* 93*  CO2 44* 40*  GLUCOSE 144* 150*  BUN 16 20  CREATININE 1.13 1.11  CALCIUM 8.8* 8.9   CBC:  Recent Labs Lab 06/17/16 1440 06/18/16 0500  WBC 7.4 7.5  HGB 12.4* 12.4*  HCT 44.5 43.4  MCV 94.5 92.1  PLT 248 241   Cardiac  Enzymes:  Recent Labs Lab 06/17/16 1440 06/17/16 2335 06/18/16 0500 06/18/16 1053  TROPONINI <0.03 <0.03 <0.03 <0.03   BNP (last 3 results)  Recent Labs  06/17/16 1440  BNP 22.6    ProBNP (last 3 results)  Recent Labs  01/22/16 1227  PROBNP 26.0    CBG:  Recent Labs Lab 06/17/16 1446 06/17/16 2249 06/18/16 0758 06/18/16 1207 06/18/16 1633  GLUCAP 131* 152* 124* 162* 137*    Recent Results (from the past 240 hour(s))  Culture, expectorated sputum-assessment     Status: None   Collection Time: 06/17/16  8:39 PM  Result Value Ref Range Status   Specimen Description SPUTUM  Final   Special Requests NONE  Final   Sputum evaluation THIS SPECIMEN IS ACCEPTABLE FOR SPUTUM CULTURE  Final   Report Status 06/17/2016 FINAL  Final  Culture, respiratory (NON-Expectorated)     Status: None (Preliminary result)   Collection Time: 06/17/16  8:39 PM  Result Value Ref Range Status   Specimen Description SPUTUM  Final   Special Requests NONE Reflexed from [id number]  Final   Gram Stain   Final    RARE WBC PRESENT, PREDOMINANTLY PMN MODERATE GRAM POSITIVE COCCI MODERATE GRAM VARIABLE ROD FEW GRAM NEGATIVE RODS Performed at [facility], [place] [address], [place], [place] [address]    Culture PENDING  Incomplete   Report Status PENDING  Incomplete     Studies: Dg Chest 2 View  Result Date: 06/17/2016 CLINICAL DATA:  Congestion for 2 weeks. EXAM: CHEST  2 VIEW COMPARISON:  PA and lateral chest 05/21/2016 and 04/09/2015. FINDINGS: There is cardiomegaly and vascular congestion. No consolidative process, pneumothorax or effusion. No acute bony abnormality. IMPRESSION: Cardiomegaly and pulmonary vascular congestion. Electronically Signed   By: Inge Rise M.D.   On: 06/17/2016 15:48    Scheduled Meds: . budesonide (PULMICORT) nebulizer solution  0.25 mg Nebulization Daily  . enoxaparin (LOVENOX) injection  80 mg Subcutaneous QHS  . furosemide  40 mg Intravenous Daily   . gemfibrozil  600 mg Oral BID AC  . guaiFENesin  600 mg Oral BID  . insulin aspart  0-20 Units Subcutaneous TID WC  . insulin aspart  0-5 Units Subcutaneous QHS  . insulin glargine  60 Units Subcutaneous Q24H  . ipratropium-albuterol  3 mL Nebulization Q4H WA  . levofloxacin  750 mg Oral Daily  . methylPREDNISolone (SOLU-MEDROL) injection  60 mg Intravenous Q6H  . potassium chloride  10 mEq Oral Daily   Continuous Infusions:  Principal Problem:   COPD with acute exacerbation (HCC) Active Problems:   Obesity, morbid ([place])   Obstructive sleep apnea   Peripheral edema   Diabetes mellitus type 2, controlled ([place])   HTN (hypertension)    Time spent: 25 minutes    Barton Dubois  [facility] Pager [phone]. If 7PM-7AM, please contact night-coverage at [email or web address], password [facility] 06/18/2016, 5:59 PM  LOS: 0 days

## 2016-06-18 NOTE — Progress Notes (Signed)
  Echocardiogram 2D Echocardiogram has been performed.  Donata Clay 06/18/2016, 3:27 PM

## 2016-06-18 NOTE — Progress Notes (Signed)
  Echocardiogram 2D Echocardiogram has been performed.  Timothy Le 06/18/2016, 4:12 PM
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
## 2016-06-19 DIAGNOSIS — E1165 Type 2 diabetes mellitus with hyperglycemia: Secondary | ICD-10-CM

## 2016-06-19 LAB — GLUCOSE, CAPILLARY
GLUCOSE-CAPILLARY: 176 mg/dL — AB (ref 65–99)
Glucose-Capillary: 188 mg/dL — ABNORMAL HIGH (ref 65–99)
Glucose-Capillary: 171 mg/dL — ABNORMAL HIGH (ref 65–99)
Glucose-Capillary: 156 mg/dL — ABNORMAL HIGH (ref 65–99)
Glucose-Capillary: 153 mg/dL — ABNORMAL HIGH (ref 65–99)

## 2016-06-19 LAB — BASIC METABOLIC PANEL
ANION GAP: 10 (ref 5–15)
BUN: 32 mg/dL — ABNORMAL HIGH (ref 6–20)
CO2: 38 mmol/L — AB (ref 22–32)
Calcium: 9.1 mg/dL (ref 8.9–10.3)
Chloride: 91 mmol/L — ABNORMAL LOW (ref 101–111)
Creatinine, Ser: 1.33 mg/dL — ABNORMAL HIGH (ref 0.61–1.24)
GFR calc Af Amer: >60 mL/min (ref >60)
GFR, EST NON AFRICAN AMERICAN: 59 mL/min — AB (ref >60)
GLUCOSE: 139 mg/dL — AB (ref 65–99)
Potassium: 4.9 mmol/L (ref 3.5–5.1)
Sodium: 139 mmol/L (ref 135–145)

## 2016-06-19 MED ORDER — SALINE SPRAY 0.65 % NA SOLN
1.0000 | NASAL | Status: DC | PRN
  Filled 2016-06-19: qty 44

## 2016-06-19 MED ORDER — METHYLPREDNISOLONE SODIUM SUCC 125 MG IJ SOLR
60.0000 mg | Freq: Two times a day (BID) | INTRAMUSCULAR | Status: DC
  Administered 2016-06-19 – 2016-06-21 (×4): 60 mg via INTRAVENOUS
  Filled 2016-06-19 (×4): qty 2

## 2016-06-19 MED ORDER — FLUTICASONE PROPIONATE 50 MCG/ACT NA SUSP: 2.0000 | Freq: Every day | NASAL | Status: DC | PRN

## 2016-06-19 MED ORDER — LORATADINE 10 MG PO TABS: 10.0000 mg | ORAL_TABLET | Freq: Every day | ORAL | Status: DC | PRN

## 2016-06-19 NOTE — Progress Notes (Signed)
PT Cancellation Note  Patient Details Name: Timothy Le MRN: [id number] DOB: 1960/09/08   Cancelled Treatment:    Reason Eval/Treat Not Completed: Other (comment) (patient ambualted  already. will begin to practice stairs and endurance.)   Claretha Cooper 06/19/2016, 4:19 PM Tresa Endo PT [phone]
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
## 2016-06-19 NOTE — Progress Notes (Signed)
[facility] PROGRESS NOTE  Timothy Le GGE:[id number] DOB: [birth year] DOA: 06/17/2016 PCP: Kerin Perna, NP  Interim summary and HPI 56 year old male with a past medical history significant for morbid obesity, chronic respiratory failure, COPD, obstructive sleep apnea and diabetes; who presented with over 2 weeks of worsening shortness of breath wheezing and orthopnea. Patient also endorses increased lower extremity edema. Found to have acute exacerbation of heart failure along with COPD exacerbation. ABG done in the ED also demonstrated hypercapnia (most likely chronic retention from obstructive sleep apnea in a patient that is noncompliant with his CPAP)  Assessment/Plan: 1-acute on chronic resp failure: Multifactorial. Appears to be due to a combination of COPD exacerbation along with CHF exacerbation. Patient also with obesity hypoventilation syndrome and obstructive sleep apnea (noncompliant with CPAP at home). -will continue solumedrol, but will start tapering, continue abx's and nebulizer treatment.  -will continue IV lasix; patient with good urine output.  -will continue daily weight, strict intake/output and low sodium diet  -continue CPAP QHS -continue oxygen supplementation and weaned down to baseline as tolerated. Patient uses 2L at home. -follow clinical response and follow electrolytes.  2-acute on chronic HF: appears to be diastolic in nature -echo has been repeated and demonstrated EF 65-70%; no wall motion abnormalities; unable to visualize right ventricle or further assess pulmonary arteries.  -continue IV lasix -follow daily weights and strict I's and O's -no CP and neg troponin X 3  -encourage on importance of low sodium diet and use of CPAP.  3-type 2 diabetes with hyperglycemia -will continue SSI and Lantus  4-HTN: was not on any antihypertensive regimen at home -will continue lasix for now -will follow VS and will adjust as needed   5-morbid  obesity: With associated obstructive sleep apnea and hypo-ventilation syndrome -patient is more compliant with CPAP while inpatient and feeling better -will continue CPAP QHS -discussed with patient about low calorie diet and increase physical activity   6-GERD -will continue PPI  7-allergic rhinitis -will use saline spray as needed -will resume flonase and claritin.   Code Status: Full Family Communication: no family at bedside  Disposition Plan: remains inpatient, continue oxygen supplementation and weaned as tolerated; will continue IV diuresis and treatment for COPD exacerbation; will start tapering solumedrol, continue nebulization and abx's.   Consultants:  None   Procedures:  2-D echo:  - Left ventricle: The cavity size was normal. Systolic function was   vigorous. The estimated ejection fraction was in the range of 65%   to 70%. Although no diagnostic regional wall motion abnormality   was identified, this possibility cannot be completely excluded on   the basis of this study. - Left atrium: The atrium was mildly dilated.   See below for x-ray reports   Antibiotics:  levaquin 5/30  HPI/Subjective: Afebrile, denies CP, patient with improvement in breathing overall. Good urine output reported. Still with fluid overload on exam.   Objective: Vitals:   06/19/16 0510 06/19/16 1341  BP: (!) 135/93 (!) 159/95  Pulse: 87 90  Resp: 18 20  Temp: 97.9 F (36.6 C) 98.4 F (36.9 C)    Intake/Output Summary (Last 24 hours) at 06/19/16 1423 Last data filed at 06/19/16 1224  Gross per 24 hour  Intake              120 ml  Output             3875 ml  Net            -  3755 ml   Filed Weights   06/17/16 1423 06/18/16 0519 06/19/16 0510  Weight: (!) 161.9 kg (357 lb) (!) 164.1 kg (361 lb 12.4 oz) (!) 175.4 kg (386 lb 11.2 oz)    Exam:   General:  Patient is afebrile, denies CP and endorses improvement in his breathing. still with difficulty in full sentences, but  better. Also fluid overload noted on exam. Using 2.5 L of oxygen supplementation. Reported good urine output   Cardiovascular: difficult to assess JVD with body habitus. No rubs, no gallops, RRR.  Respiratory: improved air movement, positive exp wheezing and fine crackles at bases bilaterally.  Abdomen: obese, no tenderness, no distension, positive BS; no guarding.  Musculoskeletal: 1 plus edema bilaterally, no cyanosis or clubbing   Data Reviewed: Basic Metabolic Panel:  Recent Labs Lab 06/17/16 1440 06/18/16 0500 06/19/16 0452  NA 144 142 139  K 4.1 4.8 4.9  CL 96* 93* 91*  CO2 44* 40* 38*  GLUCOSE 144* 150* 139*  BUN 16 20 32*  CREATININE 1.13 1.11 1.33*  CALCIUM 8.8* 8.9 9.1   CBC:  Recent Labs Lab 06/17/16 1440 06/18/16 0500  WBC 7.4 7.5  HGB 12.4* 12.4*  HCT 44.5 43.4  MCV 94.5 92.1  PLT 248 241   Cardiac Enzymes:  Recent Labs Lab 06/17/16 1440 06/17/16 2335 06/18/16 0500 06/18/16 1053  TROPONINI <0.03 <0.03 <0.03 <0.03   BNP (last 3 results)  Recent Labs  06/17/16 1440  BNP 22.6    ProBNP (last 3 results)  Recent Labs  01/22/16 1227  PROBNP 26.0    CBG:  Recent Labs Lab 06/18/16 1633 06/18/16 2039 06/19/16 0727 06/19/16 0739 06/19/16 1137  GLUCAP 137* 146* 188* 171* 176*    Recent Results (from the past 240 hour(s))  Culture, expectorated sputum-assessment     Status: None   Collection Time: 06/17/16  8:39 PM  Result Value Ref Range Status   Specimen Description SPUTUM  Final   Special Requests NONE  Final   Sputum evaluation THIS SPECIMEN IS ACCEPTABLE FOR SPUTUM CULTURE  Final   Report Status 06/17/2016 FINAL  Final  Culture, respiratory (NON-Expectorated)     Status: None (Preliminary result)   Collection Time: 06/17/16  8:39 PM  Result Value Ref Range Status   Specimen Description SPUTUM  Final   Special Requests NONE Reflexed from [id number]  Final   Gram Stain   Final    RARE WBC PRESENT, PREDOMINANTLY PMN MODERATE  GRAM POSITIVE COCCI MODERATE GRAM VARIABLE ROD FEW GRAM NEGATIVE RODS    Culture   Final    CULTURE REINCUBATED FOR BETTER GROWTH Performed at [facility], [place] [address] [place], [place] [address]    Report Status PENDING  Incomplete     Studies: Dg Chest 2 View  Result Date: 06/17/2016 CLINICAL DATA:  Congestion for 2 weeks. EXAM: CHEST  2 VIEW COMPARISON:  PA and lateral chest 05/21/2016 and 04/09/2015. FINDINGS: There is cardiomegaly and vascular congestion. No consolidative process, pneumothorax or effusion. No acute bony abnormality. IMPRESSION: Cardiomegaly and pulmonary vascular congestion. Electronically Signed   By: Inge Rise M.D.   On: 06/17/2016 15:48    Scheduled Meds: . budesonide (PULMICORT) nebulizer solution  0.25 mg Nebulization Daily  . enoxaparin (LOVENOX) injection  80 mg Subcutaneous QHS  . furosemide  40 mg Intravenous Daily  . gemfibrozil  600 mg Oral BID AC  . guaiFENesin  600 mg Oral BID  . insulin aspart  0-20 Units Subcutaneous  TID WC  . insulin aspart  0-5 Units Subcutaneous QHS  . insulin glargine  60 Units Subcutaneous Q24H  . ipratropium-albuterol  3 mL Nebulization Q4H WA  . levofloxacin  750 mg Oral Daily  . methylPREDNISolone (SOLU-MEDROL) injection  60 mg Intravenous Q12H  . pantoprazole  40 mg Oral Q1200  . potassium chloride  10 mEq Oral Daily   Continuous Infusions:  Principal Problem:   COPD with acute exacerbation (HCC) Active Problems:   Obesity, morbid ([place])   Obstructive sleep apnea   Peripheral edema   Diabetes mellitus type 2, controlled ([place])   HTN (hypertension)    Time spent: 25 minutes    Barton Dubois  [facility] Pager [phone]. If 7PM-7AM, please contact night-coverage at [email or web address], password [facility] 06/19/2016, 2:23 PM  LOS: 1 day

## 2016-06-20 DIAGNOSIS — J9601 Acute respiratory failure with hypoxia: Secondary | ICD-10-CM

## 2016-06-20 LAB — GLUCOSE, CAPILLARY
GLUCOSE-CAPILLARY: 162 mg/dL — AB (ref 65–99)
GLUCOSE-CAPILLARY: 142 mg/dL — AB (ref 65–99)
GLUCOSE-CAPILLARY: 124 mg/dL — AB (ref 65–99)
Glucose-Capillary: 155 mg/dL — ABNORMAL HIGH (ref 65–99)

## 2016-06-20 LAB — BASIC METABOLIC PANEL
Anion gap: 6 (ref 5–15)
BUN: 34 mg/dL — AB (ref 6–20)
CALCIUM: 8.9 mg/dL (ref 8.9–10.3)
CO2: 42 mmol/L — ABNORMAL HIGH (ref 22–32)
CREATININE: 1.23 mg/dL (ref 0.61–1.24)
Chloride: 94 mmol/L — ABNORMAL LOW (ref 101–111)
GFR calc Af Amer: >60 mL/min (ref >60)
GLUCOSE: 135 mg/dL — AB (ref 65–99)
POTASSIUM: 4.7 mmol/L (ref 3.5–5.1)
SODIUM: 142 mmol/L (ref 135–145)

## 2016-06-20 LAB — CULTURE, RESPIRATORY W GRAM STAIN: Culture: NORMAL

## 2016-06-20 LAB — CULTURE, RESPIRATORY

## 2016-06-20 MED ORDER — IPRATROPIUM-ALBUTEROL 0.5-2.5 (3) MG/3ML IN SOLN
3.0000 mL | Freq: Three times a day (TID) | RESPIRATORY_TRACT | Status: DC
  Administered 2016-06-20 – 2016-06-21 (×5): 3 mL via RESPIRATORY_TRACT
  Filled 2016-06-20 (×5): qty 3

## 2016-06-20 NOTE — Progress Notes (Signed)
Physical Therapy Treatment Patient Details Name: Timothy Le MRN: [id number] DOB: 15-May-1960 Today's Date: 06/20/2016    History of Present Illness 56 y.o. gentleman with a history of morbid obesity (BMI 69), COPD, former tobacco use, OSA (noncompliant with CPAP), obesity hypoventilation syndrome, asthma, HTN, and Type 2 DM who presents to the ED for evaluation of progressive shortness of breath for the past two weeks.  He reports subjective fevers.  He has had thick, tan sputum; no hemoptysis.  He has had cough and pleuritic chest pain.  He has had nausea but no vomiting.  He has had one episode of near-syncope.  He admits that he is noncompliant with CPAP (and BiPAP) at home.     PT Comments    Pt tolerated session well today. Encouraged pt and told nurse to walk 3-4 times a day (spaced out) the length of the hallway. Just requires supervision and 2 L 0 2 at this time. Pt states he is feeling better and tolerating walking better today.     Follow Up Recommendations  Home health PT     Equipment Recommendations   (bariatric RW)    Recommendations for Other Services       Precautions / Restrictions Precautions Precautions: Fall Precaution Comments: monitor O2 Restrictions Weight Bearing Restrictions: No    Mobility  Bed Mobility                  Transfers Overall transfer level: Modified independent Equipment used: None Transfers: Sit to/from Stand Sit to Stand: Modified independent (Device/Increase time)            Ambulation/Gait Ambulation/Gait assistance: Supervision Ambulation Distance (Feet): 150 Feet   Gait Pattern/deviations: Step-through pattern     General Gait Details: pt on 2 L O2 with dyspnea noted by stas staying in high 90s. Had "stiky sounding breathes wtih difficulty coughing up "sticky like phelm". walked a steady quit pace, encouraged him to pace himself for al the end he did start to fatigue.    Stairs            Wheelchair  Mobility    Modified Rankin (Stroke Patients Only)       Balance                                            Cognition Arousal/Alertness: Awake/alert Behavior During Therapy: WFL for tasks assessed/performed Overall Cognitive Status: Within Functional Limits for tasks assessed                                        Exercises      General Comments        Pertinent Vitals/Pain Pain Assessment: No/denies pain    Home Living                      Prior Function            PT Goals (current goals can now be found in the care plan section) Acute Rehab PT Goals Patient Stated Goal: to be able to walk farther PT Goal Formulation: With patient Time For Goal Achievement: 07/02/16 Potential to Achieve Goals: Good Progress towards PT goals: Progressing toward goals    Frequency    Min 3X/week  PT Plan Current plan remains appropriate    Co-evaluation              AM-PAC PT "6 Clicks" Daily Activity  Outcome Measure  Difficulty turning over in bed (including adjusting bedclothes, sheets and blankets)?: A Little Difficulty moving from lying on back to sitting on the side of the bed? : A Little Difficulty sitting down on and standing up from a chair with arms (e.g., wheelchair, bedside commode, etc,.)?: A Little Help needed moving to and from a bed to chair (including a wheelchair)?: A Little Help needed walking in hospital room?: A Little Help needed climbing 3-5 steps with a railing? : A Lot 6 Click Score: 17    End of Session Equipment Utilized During Treatment: Oxygen (2 L ) Activity Tolerance: Patient tolerated treatment well Patient left: in chair;with call bell/phone within reach Nurse Communication: Mobility status PT Visit Diagnosis: Difficulty in walking, not elsewhere classified (R26.2)     Time: 4098-1191 PT Time Calculation (min) (ACUTE ONLY): 17 min  Charges:  $Gait Training: 8-22 mins                     G Codes:       Clide Dales, PT Pager: [phone] 06/20/2016    Amy Belloso, Gatha Mayer 06/20/2016, 4:45 PM

## 2016-06-20 NOTE — Progress Notes (Signed)
[facility] PROGRESS NOTE  Timothy Le XKG:[id number] DOB: [birth year] DOA: 06/17/2016 PCP: Kerin Perna, NP  Interim summary and HPI 56 year old male with a past medical history significant for morbid obesity, chronic respiratory failure, COPD, obstructive sleep apnea and diabetes; who presented with over 2 weeks of worsening shortness of breath wheezing and orthopnea. Patient also endorses increased lower extremity edema. Found to have acute exacerbation of heart failure along with COPD exacerbation. ABG done in the ED also demonstrated hypercapnia (most likely chronic retention from obstructive sleep apnea in a patient that is noncompliant with his CPAP)  Assessment/Plan: 1-acute on chronic resp failure: Multifactorial. Appears to be due to a combination of COPD exacerbation along with CHF exacerbation. Patient also with obesity hypoventilation syndrome and obstructive sleep apnea (noncompliant with CPAP at home). -Will continue tapering steroids slowly, continue antibiotics, continue nebulizer treatment and oxygen supplementation.  -Will continue the use of CPAP daily at bedtime -Continue IV Lasix as mentioned below -Patient breathing steadily improving -Will follow electrolytes and renal function. Will replete electrolytes as needed  2-acute on chronic HF: appears to be diastolic in nature -echo has been repeated and demonstrated EF 65-70%; no wall motion abnormalities; unable to visualize right ventricle or further assess pulmonary arteries.  -Still with fluid overload on physical exam; even improve and overall breathing easier. -Will continue IV Lasix -Follow daily weights and strict I's and also -Negative troponin 3 and patient denies chest pain.  3-type 2 diabetes with hyperglycemia -CBGs a little well-controlled -Continue sliding scale insulin and Lantus -Will follow A1c  4-HTN: was not on any antihypertensive regimen at home -Blood pressure has remained  stable -Will continue IV Lasix for now -Follow vital signs and adjust antihypertensive regimen as needed.  5-morbid obesity: With associated obstructive sleep apnea and hypo-ventilation syndrome -Patient continued to be compliant with his CPAP while inpatient -Educated and motivated to continue same use after discharge  -Low calorie diet and increased physical activity has been discussed with patient; emphasizing the importance of weight loss.  -Body mass index is 57.9 kg/m.  6-GERD -Will continue PPI.   7-allergic rhinitis -Will continue the use of saline spray and also the use of Flonase  -Continue Claritin  Code Status: Full Family Communication: no family at bedside  Disposition Plan: Remains inpatient, continue oxygen supplementation, continue IV Lasix, continue Solu-Medrol (tapering regimen).    Consultants:  None   Procedures:  2-D echo:  - Left ventricle: The cavity size was normal. Systolic function was   vigorous. The estimated ejection fraction was in the range of 65%   to 70%. Although no diagnostic regional wall motion abnormality   was identified, this possibility cannot be completely excluded on   the basis of this study. - Left atrium: The atrium was mildly dilated.   See below for x-ray reports   Antibiotics:  levaquin 5/30  HPI/Subjective: Afebrile, denies chest pain, continue reporting improvement in his breathing. Patient is still with signs of fluid overload and unable to speak in full sentences.  Objective: Vitals:   06/20/16 0519 06/20/16 1520  BP: (!) 144/81 (!) 105/58  Pulse: (!) 57 76  Resp: (!) 22 (!) 22  Temp: 98.2 F (36.8 C) 98.7 F (37.1 C)    Intake/Output Summary (Last 24 hours) at 06/20/16 1925 Last data filed at 06/20/16 1852  Gross per 24 hour  Intake              720 ml  Output  3450 ml  Net            -2730 ml   Filed Weights   06/18/16 0519 06/19/16 0510 06/20/16 0519  Weight: (!) 164.1 kg (361 lb  12.4 oz) (!) 175.4 kg (386 lb 11.2 oz) (!) 172.7 kg (380 lb 12.8 oz)    Exam:   General: Patient is afebrile, denies chest pain, endorses improvement in his breathing but is still having difficulty speaking in full sentences, with appreciated wheezing and signs of fluid overload on exam. He reports good urine output and is now back to 2 L oxygen supplementation.   Cardiovascular: Unable to assess JVD secondary to body habitus, no rubs, no gallops, regular rate and rhythm, no murmur.   Respiratory: Improved air movement bilaterally, positive wheezing diffusely during expiration, by basilar crackles appreciated and Scattered rhonchi. Patient not using accessory muscles.   Abdomen: Obese, no tenderness, no distention, positive bowel sounds.   Musculoskeletal: 1+ edema bilaterally, no cyanosis, no clubbing.   Data Reviewed: Basic Metabolic Panel:  Recent Labs Lab 06/17/16 1440 06/18/16 0500 06/19/16 0452 06/20/16 0827  NA 144 142 139 142  K 4.1 4.8 4.9 4.7  CL 96* 93* 91* 94*  CO2 44* 40* 38* 42*  GLUCOSE 144* 150* 139* 135*  BUN 16 20 32* 34*  CREATININE 1.13 1.11 1.33* 1.23  CALCIUM 8.8* 8.9 9.1 8.9   CBC:  Recent Labs Lab 06/17/16 1440 06/18/16 0500  WBC 7.4 7.5  HGB 12.4* 12.4*  HCT 44.5 43.4  MCV 94.5 92.1  PLT 248 241   Cardiac Enzymes:  Recent Labs Lab 06/17/16 1440 06/17/16 2335 06/18/16 0500 06/18/16 1053  TROPONINI <0.03 <0.03 <0.03 <0.03   BNP (last 3 results)  Recent Labs  06/17/16 1440  BNP 22.6    ProBNP (last 3 results)  Recent Labs  01/22/16 1227  PROBNP 26.0    CBG:  Recent Labs Lab 06/19/16 1659 06/19/16 2111 06/20/16 0727 06/20/16 1124 06/20/16 1657  GLUCAP 156* 153* 162* 142* 155*    Recent Results (from the past 240 hour(s))  Culture, expectorated sputum-assessment     Status: None   Collection Time: 06/17/16  8:39 PM  Result Value Ref Range Status   Specimen Description SPUTUM  Final   Special Requests NONE   Final   Sputum evaluation THIS SPECIMEN IS ACCEPTABLE FOR SPUTUM CULTURE  Final   Report Status 06/17/2016 FINAL  Final  Culture, respiratory (NON-Expectorated)     Status: None   Collection Time: 06/17/16  8:39 PM  Result Value Ref Range Status   Specimen Description SPUTUM  Final   Special Requests NONE Reflexed from [id number]  Final   Gram Stain   Final    RARE WBC PRESENT, PREDOMINANTLY PMN MODERATE GRAM POSITIVE COCCI MODERATE GRAM VARIABLE ROD FEW GRAM NEGATIVE RODS    Culture   Final    Consistent with normal respiratory flora. Performed at [facility], [place] [address] [place], [place] [address]    Report Status 06/20/2016 FINAL  Final     Studies: No results found.  Scheduled Meds: . budesonide (PULMICORT) nebulizer solution  0.25 mg Nebulization Daily  . enoxaparin (LOVENOX) injection  80 mg Subcutaneous QHS  . furosemide  40 mg Intravenous Daily  . gemfibrozil  600 mg Oral BID AC  . guaiFENesin  600 mg Oral BID  . insulin aspart  0-20 Units Subcutaneous TID WC  . insulin aspart  0-5 Units Subcutaneous QHS  . insulin glargine  60 Units Subcutaneous Q24H  . ipratropium-albuterol  3 mL Nebulization TID  . levofloxacin  750 mg Oral Daily  . methylPREDNISolone (SOLU-MEDROL) injection  60 mg Intravenous Q12H  . pantoprazole  40 mg Oral Q1200  . potassium chloride  10 mEq Oral Daily   Continuous Infusions:  Principal Problem:   COPD with acute exacerbation (HCC) Active Problems:   Obesity, morbid ([place])   Obstructive sleep apnea   Peripheral edema   Diabetes mellitus type 2, controlled ([place])   HTN (hypertension)    Time spent: 25 minutes    Barton Dubois  [facility] Pager [phone]. If 7PM-7AM, please contact night-coverage at [email or web address], password [facility] 06/20/2016, 7:25 PM  LOS: 2 days

## 2016-06-20 NOTE — Progress Notes (Signed)
Pt. placed on BiPAP for h/s, tolerating well, wears 2 lpm n/c under LG/FFM, RT to monitor.

## 2016-06-21 DIAGNOSIS — J9611 Chronic respiratory failure with hypoxia: Secondary | ICD-10-CM

## 2016-06-21 LAB — BASIC METABOLIC PANEL
Anion gap: 8 (ref 5–15)
BUN: 37 mg/dL — AB (ref 6–20)
CHLORIDE: 92 mmol/L — AB (ref 101–111)
CO2: 39 mmol/L — ABNORMAL HIGH (ref 22–32)
Calcium: 8.9 mg/dL (ref 8.9–10.3)
Creatinine, Ser: 1.3 mg/dL — ABNORMAL HIGH (ref 0.61–1.24)
GFR calc non Af Amer: >60 mL/min (ref >60)
Glucose, Bld: 150 mg/dL — ABNORMAL HIGH (ref 65–99)
POTASSIUM: 5.2 mmol/L — AB (ref 3.5–5.1)
SODIUM: 139 mmol/L (ref 135–145)

## 2016-06-21 LAB — GLUCOSE, CAPILLARY
GLUCOSE-CAPILLARY: 147 mg/dL — AB (ref 65–99)
GLUCOSE-CAPILLARY: 194 mg/dL — AB (ref 65–99)
Glucose-Capillary: 140 mg/dL — ABNORMAL HIGH (ref 65–99)

## 2016-06-21 MED ORDER — PREDNISONE 20 MG PO TABS: ORAL_TABLET | ORAL | 0 refills | Status: AC

## 2016-06-21 MED ORDER — LEVOFLOXACIN 750 MG PO TABS: 750.0000 mg | ORAL_TABLET | Freq: Every day | ORAL | 0 refills | Status: DC

## 2016-06-21 MED ORDER — PANTOPRAZOLE SODIUM 40 MG PO TBEC: 40.0000 mg | DELAYED_RELEASE_TABLET | Freq: Every day | ORAL | 1 refills | Status: DC

## 2016-06-21 MED ORDER — IPRATROPIUM-ALBUTEROL 20-100 MCG/ACT IN AERS: 1.0000 | INHALATION_SPRAY | Freq: Four times a day (QID) | RESPIRATORY_TRACT | 3 refills | Status: DC | PRN

## 2016-06-21 MED ORDER — FUROSEMIDE 40 MG PO TABS
40.0000 mg | ORAL_TABLET | Freq: Two times a day (BID) | ORAL | 2 refills | Status: DC
Start: 2016-06-21 — End: 2016-10-10

## 2016-06-21 MED ORDER — GUAIFENESIN ER 600 MG PO TB12: 600.0000 mg | ORAL_TABLET | Freq: Two times a day (BID) | ORAL | 0 refills | Status: DC

## 2016-06-21 NOTE — Discharge Summary (Signed)
Physician Discharge Summary  Timothy Le:[id number] DOB: [birth year] DOA: 06/17/2016  PCP: Kerin Perna, NP  Admit date: 06/17/2016 Discharge date: 06/21/2016  Time spent: 35 minutes  Recommendations for Outpatient Follow-up:  1. Repeat basic metabolic panel to follow electrolytes and renal function 2. Assess patient volume and  Adjust diuretics as needed. 3. Reassess blood pressure and start antihypertensive regimen as needed. 4. Please arrange PCP with pulmonology for repeat PFTs and further adjust maintenance medication for his COPD.  Discharge Diagnoses:  Principal Problem:   COPD with acute exacerbation ([place]) Active Problems:   Obesity, morbid ([place])   Obstructive sleep apnea   Peripheral edema   Diabetes mellitus type 2, controlled ([place])   HTN (hypertension)   Discharge Condition: Stable and improved. Patient discharged home with instructions to follow-up with his PCP in 10 days.   Diet recommendation: Low-sodium, modified carbohydrate and low calorie diet.  Filed Weights   06/19/16 0510 06/20/16 0519 06/21/16 0605  Weight: (!) 175.4 kg (386 lb 11.2 oz) (!) 172.7 kg (380 lb 12.8 oz) (!) 173.1 kg (381 lb 9.9 oz)    Brief History of present illness:  56 year old male with a past medical history significant for morbid obesity, chronic respiratory failure, COPD, obstructive sleep apnea and diabetes; who presented with over 2 weeks of worsening shortness of breath wheezing and orthopnea. Patient also endorses increased lower extremity edema. Found to have acute exacerbation of heart failure along with COPD exacerbation. ABG done in the ED also demonstrated hypercapnia (most likely chronic retention from obstructive sleep apnea in a patient that is noncompliant with his CPAP)  Hospital Course:  1-acute on chronic resp failure: Multifactorial. Appears to be due to a combination of COPD exacerbation along with CHF exacerbation. Patient also with obesity hypoventilation  syndrome and obstructive sleep apnea (noncompliant with CPAP at home). -Will continue tapering steroids slowly, continue antibiotics, continue nebulizer treatment and oxygen supplementation.  -Will continue the use of CPAP daily at bedtime -Continue Lasix as mentioned below; now dose adjusted to 40 mg twice a day. -Patient breathing steadily improving -Will recommend basic metabolic panel to follow electrolytes and renal function.  2-acute on chronic HF: appears to be diastolic in nature -echo has been repeated and demonstrated EF 65-70%; no wall motion abnormalities; unable to visualize right ventricle or further assess pulmonary arteries.  -Significant improvement in patient's overall volume; no crackles and breathing back to his baseline. -Will discharge him adjusted dose of Lasix 40 mg twice a day. -Follow daily weights and low sodium diet -Negative troponin 3 and patient denies chest pain.  3-type 2 diabetes with hyperglycemia -CBGs a little well-controlled -Continue modified carbohydrate diet and Lantus. -A1c pending at discharge  4-HTN: was not on any antihypertensive regimen at home -Blood pressure has remained stable -Will continue Lasix and her healthy diet and -Reassess blood pressure and start antihypertensive regimen as needed.  5-morbid obesity: With associated obstructive sleep apnea and hypo-ventilation syndrome -Patient was compliant with his CPAP while inpatient -Educated and motivated to continue same use after discharge  -Low calorie diet and increased physical activity has been discussed with patient; emphasizing the importance of weight loss.  -Body mass index is 57.9 kg/m.  6-GERD -Will continue PPI.   7-allergic rhinitis -Will continue the use of saline spray and also the use of Flonase  -Continue Claritin daily  Procedures:  2-D echo:  - Left ventricle: The cavity size was normal. Systolic function was vigorous. The estimated ejection  fraction was  in the range of 65% to 70%. Although no diagnostic regional wall motion abnormality was identified, this possibility cannot be completely excluded on the basis of this study. - Left atrium: The atrium was mildly dilated.   See below for x-ray reports   Consultations:  None   Discharge Exam: Vitals:   06/21/16 0605 06/21/16 1324  BP: 136/84 125/63  Pulse: (!) 59 73  Resp: 18 20  Temp: 98.1 F (36.7 C) 98.3 F (36.8 C)    General: Patient is afebrile, denies chest pain, endorses improvement in his breathing and is now able to speak in full sentences. Back to 2 L oxygen supplementation, which is his chronic oxygen use.   Cardiovascular: Unable to assess JVD secondary to body habitus, no rubs, no gallops, regular rate and rhythm, no murmur.   Respiratory: Improved air movement bilaterally, scattered rhonchi and mild expiratory wheeze no frank crackles appreciated on exam.    Abdomen: Obese, no tenderness, no distention, positive bowel sounds.   Musculoskeletal: Trace to 1+ edema bilaterally, no cyanosis, no clubbing.    Discharge Instructions   Discharge Instructions    Diet - low sodium heart healthy    Complete by:  As directed    Diet Carb Modified    Complete by:  As directed    Discharge instructions    Complete by:  As directed    Take medications as prescribed Arrange follow-up with PCP in 10 days Follow a low-sodium diet, check her weight on daily basis, stick to a modified carbohydrate and low calorie diet. Maintain adequate hydration Be compliant with CPAP on daily basis.     Current Discharge Medication List    START taking these medications   Details  guaiFENesin (MUCINEX) 600 MG 12 hr tablet Take 1 tablet (600 mg total) by mouth 2 (two) times daily. Qty: 40 tablet, Refills: 0    Ipratropium-Albuterol (COMBIVENT RESPIMAT) 20-100 MCG/ACT AERS respimat Inhale 1 puff into the lungs every 6 (six) hours as needed for wheezing or  shortness of breath. Qty: 1 Inhaler, Refills: 3    levofloxacin (LEVAQUIN) 750 MG tablet Take 1 tablet (750 mg total) by mouth daily. Qty: 4 tablet, Refills: 0    pantoprazole (PROTONIX) 40 MG tablet Take 1 tablet (40 mg total) by mouth daily at 12 noon. Qty: 40 tablet, Refills: 1    predniSONE (DELTASONE) 20 MG tablet Take 3 tablets by mouth daily 1 day; then 2 tablets by mouth daily 2 days; then 1 tablet by mouth daily 3 days; then half tablet by mouth daily 3 days and stop prednisone. Qty: 12 tablet, Refills: 0      CONTINUE these medications which have CHANGED   Details  furosemide (LASIX) 40 MG tablet Take 1 tablet (40 mg total) by mouth 2 (two) times daily. Qty: 60 tablet, Refills: 2      CONTINUE these medications which have NOT CHANGED   Details  beclomethasone (QVAR) 40 MCG/ACT inhaler Inhale 2 puffs into the lungs daily. Qty: 1 Inhaler, Refills: 12    cholecalciferol (VITAMIN D) 1000 units tablet Take 1,000 Units by mouth daily.    fluticasone (FLONASE) 50 MCG/ACT nasal spray Place 2 sprays into both nostrils daily as needed for allergies.     gemfibrozil (LOPID) 600 MG tablet Take 600 mg by mouth 2 (two) times daily before a meal. Thirty minutes before morning and evening meal.    insulin glargine (LANTUS) 100 UNIT/ML injection Inject 0.6 mLs (60 Units total) into the  skin daily. Qty: 10 mL, Refills: 11    loratadine (CLARITIN) 10 MG tablet 1 daily for allergy while needed Qty: 30 tablet, Refills: 5    triamcinolone cream (KENALOG) 0.1 % Apply 1 application topically daily.    blood glucose meter kit and supplies Dispense based on patient and insurance preference. Use up to four times daily as directed. (FOR ICD-9 250.00, 250.01). Qty: 1 each, Refills: 0      STOP taking these medications     potassium chloride (K-DUR) 10 MEQ tablet        No Known Allergies Follow-up Information    [facility] Follow up.   Why:  rolling walker,home  02 Contact information: [place] [address] [place], [facility] Follow up.   Why:  [facility],physical therapy, social worker Contact information: [address] [place] [address] [phone]            The results of significant diagnostics from this hospitalization (including imaging, microbiology, ancillary and laboratory) are listed below for reference.    Significant Diagnostic Studies: Dg Chest 2 View  Result Date: 06/17/2016 CLINICAL DATA:  Congestion for 2 weeks. EXAM: CHEST  2 VIEW COMPARISON:  PA and lateral chest 05/21/2016 and 04/09/2015. FINDINGS: There is cardiomegaly and vascular congestion. No consolidative process, pneumothorax or effusion. No acute bony abnormality. IMPRESSION: Cardiomegaly and pulmonary vascular congestion. Electronically Signed   By: Inge Rise M.D.   On: 06/17/2016 15:48    Microbiology: Recent Results (from the past 240 hour(s))  Culture, expectorated sputum-assessment     Status: None   Collection Time: 06/17/16  8:39 PM  Result Value Ref Range Status   Specimen Description SPUTUM  Final   Special Requests NONE  Final   Sputum evaluation THIS SPECIMEN IS ACCEPTABLE FOR SPUTUM CULTURE  Final   Report Status 06/17/2016 FINAL  Final  Culture, respiratory (NON-Expectorated)     Status: None   Collection Time: 06/17/16  8:39 PM  Result Value Ref Range Status   Specimen Description SPUTUM  Final   Special Requests NONE Reflexed from [id number]  Final   Gram Stain   Final    RARE WBC PRESENT, PREDOMINANTLY PMN MODERATE GRAM POSITIVE COCCI MODERATE GRAM VARIABLE ROD FEW GRAM NEGATIVE RODS    Culture   Final    Consistent with normal respiratory flora. Performed at [facility], [place] [address], [place], [place] [address]    Report Status 06/20/2016 FINAL  Final     Labs: Basic Metabolic Panel:  Recent Labs Lab 06/17/16 1440 06/18/16 0500 06/19/16 0452  06/20/16 0827 06/21/16 0531  NA 144 142 139 142 139  K 4.1 4.8 4.9 4.7 5.2*  CL 96* 93* 91* 94* 92*  CO2 44* 40* 38* 42* 39*  GLUCOSE 144* 150* 139* 135* 150*  BUN 16 20 32* 34* 37*  CREATININE 1.13 1.11 1.33* 1.23 1.30*  CALCIUM 8.8* 8.9 9.1 8.9 8.9   Liver Function Tests: No results for input(s): AST, ALT, ALKPHOS, BILITOT, PROT, ALBUMIN in the last 168 hours. No results for input(s): LIPASE, AMYLASE in the last 168 hours. No results for input(s): AMMONIA in the last 168 hours. CBC:  Recent Labs Lab 06/17/16 1440 06/18/16 0500  WBC 7.4 7.5  HGB 12.4* 12.4*  HCT 44.5 43.4  MCV 94.5 92.1  PLT 248 241   Cardiac Enzymes:  Recent Labs Lab 06/17/16 1440  06/17/16 2335 06/18/16 0500 06/18/16 1053  TROPONINI <0.03 <0.03 <0.03 <0.03   BNP: BNP (last 3 results)  Recent Labs  06/17/16 1440  BNP 22.6    ProBNP (last 3 results)  Recent Labs  01/22/16 1227  PROBNP 26.0    CBG:  Recent Labs Lab 06/20/16 1124 06/20/16 1657 06/20/16 2059 06/21/16 0740 06/21/16 1200  GLUCAP 142* 155* 124* 147* 140*    Signed:  Barton Dubois MD.  [facility] 06/21/2016, 3:51 PM

## 2016-06-21 NOTE — Progress Notes (Signed)
NCM contacted [facility] DME rep for RW and portable tank for home. Contacted [facility] HH rep to make aware of dc home today. Jonnie Finner RN CCM Case Mgmt phone [phone]

## 2016-06-22 LAB — HEMOGLOBIN A1C
HEMOGLOBIN A1C: 6.9 % — AB (ref 4.8–5.6)
Mean Plasma Glucose: 151 mg/dL

## 2016-07-24 ENCOUNTER — Encounter: Payer: Self-pay | Admitting: Podiatry

## 2016-07-24 ENCOUNTER — Ambulatory Visit (INDEPENDENT_AMBULATORY_CARE_PROVIDER_SITE_OTHER): Payer: Medicaid Other | Admitting: Podiatry

## 2016-07-24 DIAGNOSIS — M79676 Pain in unspecified toe(s): Secondary | ICD-10-CM | POA: Diagnosis not present

## 2016-07-24 DIAGNOSIS — B351 Tinea unguium: Secondary | ICD-10-CM

## 2016-07-24 DIAGNOSIS — E1142 Type 2 diabetes mellitus with diabetic polyneuropathy: Secondary | ICD-10-CM

## 2016-07-24 NOTE — Progress Notes (Signed)
Complaint:  Visit Type: Patient returns to my office for continued preventative foot care services. Complaint: Patient states" my nails have grown long and thick and become painful to walk and wear shoes" Patient has been diagnosed with DM with no foot complications. The patient presents for preventative foot care services. No changes to ROS  Podiatric Exam: Vascular: dorsalis pedis and posterior tibial pulses are palpable bilateral. Capillary return is immediate. Temperature gradient is WNL. Skin turgor WNL  Sensorium: Normal Semmes Weinstein monofilament test. Normal tactile sensation bilaterally. Nail Exam: Pt has thick disfigured discolored nails with subungual debris noted bilateral entire nail hallux through fifth toenails Ulcer Exam: There is no evidence of ulcer or pre-ulcerative changes or infection. Orthopedic Exam: Muscle tone and strength are WNL. No limitations in general ROM. No crepitus or effusions noted. HAV  B/L. Skin: No Porokeratosis. No infection or ulcers  Diagnosis:  Onychomycosis, , Pain in right toe, pain in left toes  Treatment & Plan Procedures and Treatment: Consent by patient was obtained for treatment procedures. The patient understood the discussion of treatment and procedures well. All questions were answered thoroughly reviewed. Debridement of mycotic and hypertrophic toenails, 1 through 5 bilateral and clearing of subungual debris. No ulceration, no infection noted.  Return Visit-Office Procedure: Patient instructed to return to the office for a follow up visit 3 months for continued evaluation and treatment.    Jakeria Caissie DPM 

## 2016-07-29 ENCOUNTER — Telehealth: Payer: Self-pay | Admitting: Internal Medicine

## 2016-07-29 DIAGNOSIS — J9621 Acute and chronic respiratory failure with hypoxia: Secondary | ICD-10-CM

## 2016-07-29 DIAGNOSIS — J42 Unspecified chronic bronchitis: Secondary | ICD-10-CM

## 2016-07-29 MED ORDER — IPRATROPIUM-ALBUTEROL 0.5-2.5 (3) MG/3ML IN SOLN: 3.0000 mL | Freq: Four times a day (QID) | RESPIRATORY_TRACT | 12 refills | Status: DC | PRN

## 2016-07-29 NOTE — Telephone Encounter (Signed)
Please schedule for outpatient PFT if he doesn't already have that appointment.  Ok to order DME home compressor nebulizer and ipratropium-albuterol neb solution, # 120 ampules, 1 neb every  Hours, refill x 12

## 2016-07-29 NOTE — Telephone Encounter (Signed)
Spoke with pt's home health nurse, Amy who feels that pt could benefit from nebulizer machine.  Amy states pt has occ wheezing & prod cough with thick tan mucus x3w. Amy states pt's BP has been low.BP reading this am was 96/68. Amy is currently awaiting response from pt's PCP. Pt was discharged on 06/21/16. Amy states d/c summary states for pt to f/u with pulmonary for PFT.  Pt is scheduled for f/u 11/23/16.  CY please advise. Thanks.  Current Outpatient Prescriptions on File Prior to Visit  Medication Sig Dispense Refill  . beclomethasone (QVAR) 40 MCG/ACT inhaler Inhale 2 puffs into the lungs daily. 1 Inhaler 12  . blood glucose meter kit and supplies Dispense based on patient and insurance preference. Use up to four times daily as directed. (FOR ICD-9 250.00, 250.01). 1 each 0  . cholecalciferol (VITAMIN D) 1000 units tablet Take 1,000 Units by mouth daily.    . fluticasone (FLONASE) 50 MCG/ACT nasal spray Place 2 sprays into both nostrils daily as needed for allergies.     . furosemide (LASIX) 40 MG tablet Take 1 tablet (40 mg total) by mouth 2 (two) times daily. 60 tablet 2  . gemfibrozil (LOPID) 600 MG tablet Take 600 mg by mouth 2 (two) times daily before a meal. Thirty minutes before morning and evening meal.    . guaiFENesin (MUCINEX) 600 MG 12 hr tablet Take 1 tablet (600 mg total) by mouth 2 (two) times daily. 40 tablet 0  . insulin glargine (LANTUS) 100 UNIT/ML injection Inject 0.6 mLs (60 Units total) into the skin daily. 10 mL 11  . Ipratropium-Albuterol (COMBIVENT RESPIMAT) 20-100 MCG/ACT AERS respimat Inhale 1 puff into the lungs every 6 (six) hours as needed for wheezing or shortness of breath. 1 Inhaler 3  . levofloxacin (LEVAQUIN) 750 MG tablet Take 1 tablet (750 mg total) by mouth daily. 4 tablet 0  . loratadine (CLARITIN) 10 MG tablet 1 daily for allergy while needed 30 tablet 5  . pantoprazole (PROTONIX) 40 MG tablet Take 1 tablet (40 mg total) by mouth daily at 12 noon. 40  tablet 1  . triamcinolone cream (KENALOG) 0.1 % Apply 1 application topically daily.     No current facility-administered medications on file prior to visit.     No Known Allergies

## 2016-07-29 NOTE — Telephone Encounter (Signed)
Spoke with Amy. Advised her that CY would like for the patient to have PFT something before his appt. Also advised her that he has given the ok for a neb machine and solution to be sent to his house. She stated that he is receiving his oxygen through [facility] so it would be nice if the neb machine could come from [facility] as well. She also stated that the patient likes to use [employer].

## 2016-07-31 ENCOUNTER — Ambulatory Visit (INDEPENDENT_AMBULATORY_CARE_PROVIDER_SITE_OTHER): Payer: Medicaid Other | Admitting: Internal Medicine

## 2016-07-31 DIAGNOSIS — J42 Unspecified chronic bronchitis: Secondary | ICD-10-CM

## 2016-07-31 LAB — PULMONARY FUNCTION TEST
DL/VA % PRED: 117 %
DL/VA: 5.29 ml/min/mmHg/L
DLCO COR % PRED: 48 %
DLCO COR: 14.36 ml/min/mmHg
DLCO UNC % PRED: 52 %
DLCO UNC: 15.6 ml/min/mmHg
FEF 25-75 POST: 0.87 L/s
FEF 25-75 PRE: 1.54 L/s
FEF2575-%CHANGE-POST: -43 %
FEF2575-%PRED-PRE: 52 %
FEF2575-%Pred-Post: 29 %
FEV1-%Change-Post: -5 %
FEV1-%PRED-POST: 43 %
FEV1-%Pred-Pre: 46 %
FEV1-PRE: 1.39 L
FEV1-Post: 1.3 L
FEV1FVC-%Change-Post: 3 %
FEV1FVC-%PRED-PRE: 98 %
FEV6-%Change-Post: -8 %
FEV6-%PRED-POST: 44 %
FEV6-%Pred-Pre: 48 %
FEV6-POST: 1.63 L
FEV6-Pre: 1.79 L
FEV6FVC-%PRED-POST: 103 %
FEV6FVC-%Pred-Pre: 103 %
FVC-%Change-Post: -8 %
FVC-%Pred-Post: 42 %
FVC-%Pred-Pre: 46 %
FVC-Post: 1.63 L
FVC-Pre: 1.79 L
POST FEV1/FVC RATIO: 80 %
PRE FEV1/FVC RATIO: 78 %
PRE FEV6/FVC RATIO: 100 %
Post FEV6/FVC ratio: 100 %
RV % pred: 118 %
RV: 2.41 L
TLC % pred: 64 %
TLC: 4.23 L

## 2016-07-31 NOTE — Progress Notes (Signed)
PFT done today. 

## 2016-10-01 ENCOUNTER — Ambulatory Visit
Admission: RE | Admit: 2016-10-01 | Discharge: 2016-10-01 | Disposition: A | Discharge status: Home / Self Care | Payer: Medicaid Other | Source: Ambulatory Visit | Attending: Internal Medicine | Admitting: Internal Medicine

## 2016-10-01 ENCOUNTER — Other Ambulatory Visit: Payer: Self-pay | Admitting: Internal Medicine

## 2016-10-01 DIAGNOSIS — M79671 Pain in right foot: Secondary | ICD-10-CM

## 2016-10-01 DIAGNOSIS — M79672 Pain in left foot: Secondary | ICD-10-CM

## 2016-10-10 ENCOUNTER — Encounter (HOSPITAL_COMMUNITY): Payer: Self-pay

## 2016-10-10 ENCOUNTER — Emergency Department (HOSPITAL_BASED_OUTPATIENT_CLINIC_OR_DEPARTMENT_OTHER)
Admit: 2016-10-10 | Discharge: 2016-10-10 | Disposition: A | Discharge status: Home / Self Care | Payer: Medicaid Other | Attending: Emergency Medicine | Admitting: Emergency Medicine

## 2016-10-10 ENCOUNTER — Emergency Department (HOSPITAL_COMMUNITY): Payer: Medicaid Other

## 2016-10-10 ENCOUNTER — Emergency Department (HOSPITAL_COMMUNITY)
Admission: EM | Admit: 2016-10-10 | Discharge: 2016-10-10 | Disposition: A | Discharge status: Home / Self Care | Payer: Medicaid Other | Attending: Emergency Medicine | Admitting: Emergency Medicine

## 2016-10-10 DIAGNOSIS — I110 Hypertensive heart disease with heart failure: Secondary | ICD-10-CM | POA: Diagnosis not present

## 2016-10-10 DIAGNOSIS — R0602 Shortness of breath: Secondary | ICD-10-CM

## 2016-10-10 DIAGNOSIS — R2243 Localized swelling, mass and lump, lower limb, bilateral: Secondary | ICD-10-CM | POA: Insufficient documentation

## 2016-10-10 DIAGNOSIS — E119 Type 2 diabetes mellitus without complications: Secondary | ICD-10-CM | POA: Insufficient documentation

## 2016-10-10 DIAGNOSIS — R609 Edema, unspecified: Secondary | ICD-10-CM | POA: Diagnosis not present

## 2016-10-10 DIAGNOSIS — I509 Heart failure, unspecified: Secondary | ICD-10-CM | POA: Insufficient documentation

## 2016-10-10 DIAGNOSIS — R600 Localized edema: Secondary | ICD-10-CM

## 2016-10-10 DIAGNOSIS — M79605 Pain in left leg: Secondary | ICD-10-CM | POA: Insufficient documentation

## 2016-10-10 DIAGNOSIS — M79604 Pain in right leg: Secondary | ICD-10-CM | POA: Diagnosis not present

## 2016-10-10 DIAGNOSIS — Z794 Long term (current) use of insulin: Secondary | ICD-10-CM | POA: Diagnosis not present

## 2016-10-10 DIAGNOSIS — Z87891 Personal history of nicotine dependence: Secondary | ICD-10-CM | POA: Diagnosis not present

## 2016-10-10 DIAGNOSIS — J449 Chronic obstructive pulmonary disease, unspecified: Secondary | ICD-10-CM | POA: Diagnosis not present

## 2016-10-10 LAB — CBC WITH DIFFERENTIAL/PLATELET
Basophils Absolute: 0 10*3/uL (ref 0.0–0.1)
Basophils Relative: 0 %
Eosinophils Absolute: 0.1 10*3/uL (ref 0.0–0.7)
Eosinophils Relative: 1 %
HCT: 36.2 % — ABNORMAL LOW (ref 39.0–52.0)
Hemoglobin: 10.8 g/dL — ABNORMAL LOW (ref 13.0–17.0)
Lymphocytes Relative: 20 %
Lymphs Abs: 1.8 10*3/uL (ref 0.7–4.0)
MCH: 29.1 pg (ref 26.0–34.0)
MCHC: 29.8 g/dL — ABNORMAL LOW (ref 30.0–36.0)
MCV: 97.6 fL (ref 78.0–100.0)
Monocytes Absolute: 0.9 10*3/uL (ref 0.1–1.0)
Monocytes Relative: 10 %
Neutro Abs: 6.1 10*3/uL (ref 1.7–7.7)
Neutrophils Relative %: 69 %
Platelets: 255 10*3/uL (ref 150–400)
RBC: 3.71 MIL/uL — ABNORMAL LOW (ref 4.22–5.81)
RDW: 20.4 % — ABNORMAL HIGH (ref 11.5–15.5)
WBC: 8.9 10*3/uL (ref 4.0–10.5)

## 2016-10-10 LAB — BASIC METABOLIC PANEL
Anion gap: 8 (ref 5–15)
BUN: 36 mg/dL — ABNORMAL HIGH (ref 6–20)
CO2: 33 mmol/L — ABNORMAL HIGH (ref 22–32)
Calcium: 8.9 mg/dL (ref 8.9–10.3)
Chloride: 96 mmol/L — ABNORMAL LOW (ref 101–111)
Creatinine, Ser: 1.74 mg/dL — ABNORMAL HIGH (ref 0.61–1.24)
GFR calc Af Amer: 49 mL/min — ABNORMAL LOW (ref >60)
GFR calc non Af Amer: 42 mL/min — ABNORMAL LOW (ref >60)
Glucose, Bld: 115 mg/dL — ABNORMAL HIGH (ref 65–99)
Potassium: 4.3 mmol/L (ref 3.5–5.1)
Sodium: 137 mmol/L (ref 135–145)

## 2016-10-10 MED ORDER — FENTANYL CITRATE (PF) 100 MCG/2ML IJ SOLN
50.0000 ug | Freq: Once | INTRAMUSCULAR | Status: AC
  Administered 2016-10-10: 50 ug via INTRAVENOUS
  Filled 2016-10-10: qty 2

## 2016-10-10 MED ORDER — FUROSEMIDE 40 MG PO TABS: 40.0000 mg | ORAL_TABLET | Freq: Two times a day (BID) | ORAL | 0 refills | Status: DC

## 2016-10-10 MED ORDER — FUROSEMIDE 40 MG PO TABS: 40.0000 mg | ORAL_TABLET | Freq: Two times a day (BID) | ORAL | 2 refills | Status: DC

## 2016-10-10 MED ORDER — FUROSEMIDE 10 MG/ML IJ SOLN
40.0000 mg | Freq: Once | INTRAMUSCULAR | Status: AC
  Administered 2016-10-10: 40 mg via INTRAVENOUS
  Filled 2016-10-10: qty 4

## 2016-10-10 MED ORDER — POTASSIUM CHLORIDE CRYS ER 20 MEQ PO TBCR: 20.0000 meq | EXTENDED_RELEASE_TABLET | Freq: Two times a day (BID) | ORAL | 0 refills | Status: DC

## 2016-10-10 NOTE — ED Provider Notes (Signed)
[facility] DEPT Provider Note   CSN: [id number] Arrival date & time: 10/10/16  0935     History   Chief Complaint Chief Complaint  Patient presents with  . Leg Pain    HPI Timothy Le is a 56 y.o. male.  HPI   4yM with bilateral leg pain and swelling. Hx of the same. Worsening over the past week. No trauma. No acute rash/redness. No acute respiratory complaints.No fever or chills.   Past Medical History:  Diagnosis Date  . Asthma   . COPD (chronic obstructive pulmonary disease) ([place])   . Diabetes mellitus without complication ([place])   . Hypertension   . Shortness of breath dyspnea   . Sleep apnea     Patient Active Problem List   Diagnosis Date Noted  . COPD with acute exacerbation ([place]) 06/17/2016  . Seasonal and perennial allergic rhinitis 05/24/2016  . AKI (acute kidney injury) ([place]) 04/08/2015  . Hypovolemic shock ([place]) 04/08/2015  . DKA, type 2 ([place]) 04/08/2015  . Sepsis ([place]) 04/08/2015  . Hyponatremia 04/08/2015  . DM (diabetes mellitus) ([place]) 03/28/2015  . HTN (hypertension) 03/28/2015  . Near syncope 03/28/2015  . Type 2 diabetes mellitus with hyperosmolar nonketotic hyperglycemia ([place]) 03/28/2015  . Hyperglycemia 03/27/2015  . Acute on chronic respiratory failure with hypoxemia ([place]) 03/27/2015  . Acute on chronic congestive heart failure ([place])   . COPD exacerbation ([place]) 02/07/2015  . Pressure ulcer 12/28/2014  . COPD mixed type ([place]) 12/27/2014  . Diabetes mellitus type 2, controlled ([place]) 12/27/2014  . Obesity, morbid ([place]) 07/29/2013  . Obstructive sleep apnea 07/29/2013  . Peripheral edema 07/29/2013    History reviewed. No pertinent surgical history.     Home Medications    Prior to Admission medications   Medication Sig Start Date End Date Taking? Authorizing Provider  beclomethasone (QVAR) 40 MCG/ACT inhaler Inhale 2 puffs into the lungs daily. 07/18/13   Piepenbrink, Anderson [place], PA-C  blood glucose meter kit and supplies  Dispense based on patient and insurance preference. Use up to four times daily as directed. (FOR ICD-9 250.00, 250.01). 03/29/15   [name], MD  cholecalciferol (VITAMIN D) 1000 units tablet Take 1,000 Units by mouth daily.    [provider]  fluticasone (FLONASE) 50 MCG/ACT nasal spray Place 2 sprays into both nostrils daily as needed for allergies.     [provider]  furosemide (LASIX) 40 MG tablet Take 1 tablet (40 mg total) by mouth 2 (two) times daily. 06/21/16   Barton Dubois, MD  gemfibrozil (LOPID) 600 MG tablet Take 600 mg by mouth 2 (two) times daily before a meal. Thirty minutes before morning and evening meal.    [provider]  guaiFENesin (MUCINEX) 600 MG 12 hr tablet Take 1 tablet (600 mg total) by mouth 2 (two) times daily. 06/21/16   Barton Dubois, MD  insulin glargine (LANTUS) 100 UNIT/ML injection Inject 0.6 mLs (60 Units total) into the skin daily. 04/12/15   Geradine Girt, DO  Ipratropium-Albuterol (COMBIVENT RESPIMAT) 20-100 MCG/ACT AERS respimat Inhale 1 puff into the lungs every 6 (six) hours as needed for wheezing or shortness of breath. 06/21/16   Barton Dubois, MD  ipratropium-albuterol (DUONEB) 0.5-2.5 (3) MG/3ML SOLN Take 3 mLs by nebulization every 6 (six) hours as needed. 07/29/16   Deneise Lever, MD  levofloxacin (LEVAQUIN) 750 MG tablet Take 1 tablet (750 mg total) by mouth daily. 06/22/16   Barton Dubois, MD  loratadine (CLARITIN) 10 MG tablet 1 daily for  allergy while needed 05/21/16   Baird Lyons D, MD  pantoprazole (PROTONIX) 40 MG tablet Take 1 tablet (40 mg total) by mouth daily at 12 noon. 06/22/16   Barton Dubois, MD  triamcinolone cream (KENALOG) 0.1 % Apply 1 application topically daily.    [provider]    Family History Family History  Problem Relation Age of Onset  . Asthma Sister   . Asthma Other        nephew  . Hypertension Sister   . Diabetes type II Sister     Social History Social History    Substance Use Topics  . Smoking status: Former Smoker    Packs/day: 0.50    Years: 0.50    Types: Cigarettes    Start date: 07/28/1983    Quit date: 02/27/2013  . Smokeless tobacco: Never Used  . Alcohol use No     Comment: quit ETOH about 5 months ago-beer and liquor     Allergies   Patient has no known allergies.   Review of Systems Review of Systems  All systems reviewed and negative, other than as noted in HPI.  Physical Exam Updated Vital Signs BP (!) 95/47   Pulse 97   Temp 98.8 F (37.1 C) (Oral)   Resp (!) 24   Ht '5\' 8"'  (1.727 m)   Wt (!) 166 kg (366 lb)   SpO2 96%   BMI 55.65 kg/m   Physical Exam  Constitutional: He appears well-developed and well-nourished. No distress.  HENT:  Head: Normocephalic and atraumatic.  Eyes: Conjunctivae are normal. Right eye exhibits no discharge. Left eye exhibits no discharge.  Neck: Neck supple.  Cardiovascular: Normal rate, regular rhythm and normal heart sounds.  Exam reveals no gallop and no friction rub.   No murmur heard. Pulmonary/Chest:  Noisy respirations. Decreased lung sounds on auscultation.   Abdominal: Soft. He exhibits no distension. There is no tenderness.  Musculoskeletal: He exhibits edema. He exhibits no tenderness.  Severe symmetric LE edema. Chronic stasis changes. No cellulitis.   Neurological: He is alert.  Skin: Skin is warm and dry.  Psychiatric: He has a normal mood and affect. His behavior is normal. Thought content normal.  Nursing note and vitals reviewed.    ED Treatments / Results  Labs (all labs ordered are listed, but only abnormal results are displayed) Labs Reviewed  CBC WITH DIFFERENTIAL/PLATELET - Abnormal; Notable for the following:       Result Value   RBC 3.71 (*)    Hemoglobin 10.8 (*)    HCT 36.2 (*)    MCHC 29.8 (*)    RDW 20.4 (*)    All other components within normal limits  BASIC METABOLIC PANEL - Abnormal; Notable for the following:    Chloride 96 (*)    CO2 33  (*)    Glucose, Bld 115 (*)    BUN 36 (*)    Creatinine, Ser 1.74 (*)    GFR calc non Af Amer 42 (*)    GFR calc Af Amer 49 (*)    All other components within normal limits  CBG MONITORING, ED - Abnormal; Notable for the following:    Glucose-Capillary 390 (*)    All other components within normal limits  CBG MONITORING, ED - Abnormal; Notable for the following:    Glucose-Capillary 419 (*)    All other components within normal limits    EKG  EKG Interpretation None       Radiology No results found.  Dg Chest Portable 1 View  Result Date: 10/10/2016 CLINICAL DATA:  Leg swelling and pain EXAM: PORTABLE CHEST 1 VIEW COMPARISON:  06/17/2016 FINDINGS: Cardiac shadow is again enlarged. The lungs are clear but hypoinflated. The bony structures are within normal limits. IMPRESSION: Poor inspiratory effort although no acute abnormality is seen. Electronically Signed   By: Inez Catalina M.D.   On: 10/10/2016 11:07   Dg Foot 2 Views Left  Result Date: 10/01/2016 CLINICAL DATA:  Bilateral foot pain for several weeks without known injury. EXAM: LEFT FOOT - 2 VIEW COMPARISON:  None. FINDINGS: There is no evidence of fracture or dislocation. There is no evidence of arthropathy. Mild spurring of posterior calcaneus is noted. Soft tissues are unremarkable. IMPRESSION: No acute abnormality seen in the left foot. Electronically Signed   By: Marijo Conception, M.D.   On: 10/01/2016 15:15   Dg Foot 2 Views Right  Result Date: 10/01/2016 CLINICAL DATA:  Bilateral foot pain for several weeks without known injury. EXAM: RIGHT FOOT - 2 VIEW COMPARISON:  Radiographs of November 05, 2015. FINDINGS: There is no evidence of fracture or dislocation. There is no evidence of arthropathy. Mild spurring of posterior calcaneus is noted. Soft tissues are unremarkable. IMPRESSION: No acute abnormality seen in the right foot. Electronically Signed   By: Marijo Conception, M.D.   On: 10/01/2016 15:14     Procedures Procedures (including critical care time)  Medications Ordered in ED Medications - No data to display   Initial Impression / Assessment and Plan / ED Course  I have reviewed the triage vital signs and the nursing notes.  Pertinent labs & imaging results that were available during my care of the patient were reviewed by me and considered in my medical decision making (see chart for details).     56yM with LE pain and edema. I suspect pain is from the edema. [place] negative. Diuretics. Outpt FU. Mild hypoxemia and home o2 but has been "trying to wean himself off." Denies acute dyspnea.   Final Clinical Impressions(s) / ED Diagnoses   Final diagnoses:  Pain in both lower extremities  Bilateral leg edema    New Prescriptions New Prescriptions   No medications on file     Virgel Manifold, MD 10/16/16 1423

## 2016-10-10 NOTE — Progress Notes (Signed)
VASCULAR LAB PRELIMINARY  PRELIMINARY  PRELIMINARY  PRELIMINARY  Bilateral lower extremity venous duplex completed.    Preliminary report:  There is no obvious evidence of DVT or SVT noted in the visualized veins of the bilateral lower extremities.    Report given to Dr. Augustine Radar, [facility], RVT 10/10/2016, 1:33 PM

## 2016-10-10 NOTE — Discharge Instructions (Signed)
Take lasix TWICE a day and potassium with it.

## 2016-10-10 NOTE — ED Triage Notes (Signed)
Per EMS- patient c/o bilateral leg pain x 1 week. EMs stated that O2 Sats dropped to 88-89% with exertion, so they started O2 @[place]  via [place]. Patient informed that he has been trying to wean himself off of oxygen and has not been wearing his CPAP at night.

## 2016-10-12 LAB — CBG MONITORING, ED
GLUCOSE-CAPILLARY: 390 mg/dL — AB (ref 65–99)
GLUCOSE-CAPILLARY: 419 mg/dL — AB (ref 65–99)

## 2016-10-21 ENCOUNTER — Ambulatory Visit (INDEPENDENT_AMBULATORY_CARE_PROVIDER_SITE_OTHER): Payer: Medicaid Other | Admitting: Podiatry

## 2016-10-21 ENCOUNTER — Encounter: Payer: Self-pay | Admitting: Podiatry

## 2016-10-21 DIAGNOSIS — B351 Tinea unguium: Secondary | ICD-10-CM

## 2016-10-21 DIAGNOSIS — M79676 Pain in unspecified toe(s): Secondary | ICD-10-CM

## 2016-10-21 DIAGNOSIS — E1142 Type 2 diabetes mellitus with diabetic polyneuropathy: Secondary | ICD-10-CM

## 2016-10-21 NOTE — Progress Notes (Addendum)
Complaint:  Visit Type: Patient returns to my office for continued preventative foot care services. Complaint: Patient states" my nails have grown long and thick and become painful to walk and wear shoes" Patient has been diagnosed with DM with no foot complications. The patient presents for preventative foot care services. No changes to ROS  Podiatric Exam: Vascular: dorsalis pedis and posterior tibial pulses are palpable bilateral. Capillary return is immediate. Temperature gradient is WNL. Skin turgor WNL  Sensorium: Normal Semmes Weinstein monofilament test. Normal tactile sensation bilaterally. Nail Exam: Pt has thick disfigured discolored nails with subungual debris noted bilateral entire nail hallux through fifth toenails Ulcer Exam: There is no evidence of ulcer or pre-ulcerative changes or infection. Orthopedic Exam: Muscle tone and strength are WNL. No limitations in general ROM. No crepitus or effusions noted. HAV  B/L Skin: No Porokeratosis. No infection or ulcers  Diagnosis:  Onychomycosis, , Pain in right toe, pain in left toes  Treatment & Plan Procedures and Treatment: Consent by patient was obtained for treatment procedures. The patient understood the discussion of treatment and procedures well. All questions were answered thoroughly reviewed. Debridement of mycotic and hypertrophic toenails, 1 through 5 bilateral and clearing of subungual debris. No ulceration, no infection noted.  Patient does not qualify for shoes. Signed medicaid  ABN for 2018.  Return Visit-Office Procedure: Patient instructed to return to the office for a follow up visit 3 months for continued evaluation and treatment.    Gardiner Barefoot DPM

## 2016-11-23 ENCOUNTER — Ambulatory Visit: Payer: Medicaid Other | Admitting: Internal Medicine

## 2016-11-25 ENCOUNTER — Telehealth: Payer: Self-pay | Admitting: Internal Medicine

## 2016-11-25 MED ORDER — LORATADINE 10 MG PO TABS: ORAL_TABLET | ORAL | 11 refills | Status: DC

## 2016-11-25 NOTE — Telephone Encounter (Signed)
rx sent

## 2017-01-20 ENCOUNTER — Encounter (HOSPITAL_COMMUNITY): Payer: Self-pay

## 2017-01-20 ENCOUNTER — Emergency Department (HOSPITAL_COMMUNITY)
Admission: EM | Admit: 2017-01-20 | Discharge: 2017-01-20 | Disposition: A | Discharge status: Home / Self Care | Payer: Medicaid Other | Attending: Emergency Medicine | Admitting: Emergency Medicine

## 2017-01-20 ENCOUNTER — Ambulatory Visit: Payer: Medicaid Other | Admitting: Podiatry

## 2017-01-20 ENCOUNTER — Other Ambulatory Visit: Payer: Self-pay

## 2017-01-20 ENCOUNTER — Emergency Department (HOSPITAL_COMMUNITY): Payer: Medicaid Other

## 2017-01-20 DIAGNOSIS — M79606 Pain in leg, unspecified: Secondary | ICD-10-CM | POA: Diagnosis present

## 2017-01-20 DIAGNOSIS — J45909 Unspecified asthma, uncomplicated: Secondary | ICD-10-CM | POA: Insufficient documentation

## 2017-01-20 DIAGNOSIS — J449 Chronic obstructive pulmonary disease, unspecified: Secondary | ICD-10-CM | POA: Diagnosis not present

## 2017-01-20 DIAGNOSIS — E119 Type 2 diabetes mellitus without complications: Secondary | ICD-10-CM | POA: Insufficient documentation

## 2017-01-20 DIAGNOSIS — I10 Essential (primary) hypertension: Secondary | ICD-10-CM | POA: Insufficient documentation

## 2017-01-20 DIAGNOSIS — G4733 Obstructive sleep apnea (adult) (pediatric): Secondary | ICD-10-CM | POA: Diagnosis not present

## 2017-01-20 DIAGNOSIS — Z87891 Personal history of nicotine dependence: Secondary | ICD-10-CM | POA: Insufficient documentation

## 2017-01-20 DIAGNOSIS — R600 Localized edema: Secondary | ICD-10-CM

## 2017-01-20 DIAGNOSIS — R2243 Localized swelling, mass and lump, lower limb, bilateral: Secondary | ICD-10-CM | POA: Insufficient documentation

## 2017-01-20 DIAGNOSIS — R0601 Orthopnea: Secondary | ICD-10-CM | POA: Diagnosis not present

## 2017-01-20 DIAGNOSIS — Z794 Long term (current) use of insulin: Secondary | ICD-10-CM | POA: Insufficient documentation

## 2017-01-20 LAB — COMPREHENSIVE METABOLIC PANEL
ALK PHOS: 65 U/L (ref 38–126)
ALT: 12 U/L — AB (ref 17–63)
AST: 23 U/L (ref 15–41)
Albumin: 3.3 g/dL — ABNORMAL LOW (ref 3.5–5.0)
Anion gap: 7 (ref 5–15)
BILIRUBIN TOTAL: 0.6 mg/dL (ref 0.3–1.2)
BUN: 19 mg/dL (ref 6–20)
CALCIUM: 9.3 mg/dL (ref 8.9–10.3)
CO2: 38 mmol/L — ABNORMAL HIGH (ref 22–32)
CREATININE: 0.9 mg/dL (ref 0.61–1.24)
Chloride: 94 mmol/L — ABNORMAL LOW (ref 101–111)
Glucose, Bld: 110 mg/dL — ABNORMAL HIGH (ref 65–99)
Potassium: 4.4 mmol/L (ref 3.5–5.1)
Sodium: 139 mmol/L (ref 135–145)
TOTAL PROTEIN: 8.5 g/dL — AB (ref 6.5–8.1)

## 2017-01-20 LAB — CBC
HCT: 37.9 % — ABNORMAL LOW (ref 39.0–52.0)
Hemoglobin: 11 g/dL — ABNORMAL LOW (ref 13.0–17.0)
MCH: 27.4 pg (ref 26.0–34.0)
MCHC: 29 g/dL — ABNORMAL LOW (ref 30.0–36.0)
MCV: 94.5 fL (ref 78.0–100.0)
PLATELETS: 296 10*3/uL (ref 150–400)
RBC: 4.01 MIL/uL — AB (ref 4.22–5.81)
RDW: 17.8 % — AB (ref 11.5–15.5)
WBC: 10.2 10*3/uL (ref 4.0–10.5)

## 2017-01-20 LAB — BRAIN NATRIURETIC PEPTIDE: B NATRIURETIC PEPTIDE 5: 15.4 pg/mL (ref 0.0–100.0)

## 2017-01-20 MED ORDER — FUROSEMIDE 10 MG/ML IJ SOLN
60.0000 mg | Freq: Once | INTRAMUSCULAR | Status: AC
  Administered 2017-01-20: 60 mg via INTRAVENOUS
  Filled 2017-01-20: qty 8

## 2017-01-20 MED ORDER — SODIUM CHLORIDE 0.9 % IV SOLN
INTRAVENOUS | Status: DC
  Administered 2017-01-20: 11:00:00 via INTRAVENOUS

## 2017-01-20 NOTE — Care Management Note (Signed)
Case Management Note  Patient Details  Name: CURRY SEEFELDT MRN: [id number] Date of Birth: 11-12-1960  Subjective/Objective:                  57 y.o. Male c/o diffuse body swelling 'since around Thanksgiving'. Symptoms moderate, persistent, constant, without acute or abrupt change today or this week. Patient w morbidly obese, hx sleep apnea/home o2, diabetes and htn.   Action/Plan: CM consulted for HHS.  Pt from home alone.  Advised Dr. Ashok Cordia and Sharyn Lull primary RN that pt will have limited options and services based on his insurance.  CM reviewed chart and noted that pt was with [facility] in the last 6 months.  Contacted [facility] who advised they could accept the pt but were not able to provide RN services.  Advised Dr. Ashok Cordia that CM had found an agency to accept the pt but could not do RN [place].  He stated the RN was the least necessary of the HHS he has ordered and that would be ok if the agency could do the other services.  Updated [facility] to go a head with contacting pt and starting services.  No further CM needs noted at this tim.e  Expected Discharge Date:   01/30/2017               Expected Discharge Plan:  [place]  Discharge planning Services  CM Consult  Post Acute Care Choice:  Home Health Choice offered to:  NA(Limited agencies will accept pt's insurance for [facility].)  HH Arranged:  PT, Nurse's Aide, Social Work [employer] Agency:   [place]  Status of Service:  Completed, signed off  Lurlean Kernen, Benjaman Lobe, RN 01/20/2017, 2:06 PM

## 2017-01-20 NOTE — ED Notes (Signed)
[facility] at bedside to transport patient. Noticed patient struggling to get out of bed and onto stretcher. Pt lives alone and does not have any home assistance. New orders for case management consult to evaluate for home health given.

## 2017-01-20 NOTE — Discharge Instructions (Signed)
It was our pleasure to provide your ER care today - we hope that you feel better.  Continue your medication, including your fluid medication.  Elevate your legs.   Limit salt intake, and do not add any additional salt to foods you eat - see heart healthy eating plan.  It is extremely likely that achieving a healthier body weight will improve your breathing, swelling, and pain.  Discuss with your doctor pursuing a healthy eating and exercise plan to improve your health and well being.   Follow up with your primary care doctor in the next few days.  Return to ER if worse, new symptoms, fevers, increased trouble breathing, recurrent or persistent chest pain, other concern.

## 2017-01-20 NOTE — ED Notes (Signed)
Bed: [facility] Expected date:  Expected time:  Means of arrival:  Comments: Hold for room 15

## 2017-01-20 NOTE — ED Notes (Signed)
[facility] notified of need for transport. 

## 2017-01-20 NOTE — ED Notes (Signed)
Pt complains of right leg pain and swelling in both legs for two weeks

## 2017-01-20 NOTE — ED Notes (Signed)
This RN attempted to gain IV access and draw labs x2. Will consult another RN to attempt to gain IV access.

## 2017-01-20 NOTE — ED Provider Notes (Signed)
[facility] DEPT Provider Note   CSN: [id number] Arrival date & time: 01/20/17  0349     History   Chief Complaint Chief Complaint  Patient presents with  . Leg Pain    HPI Timothy Le is a 57 y.o. male.  Patient c/o diffuse body swelling 'since around Thanksgiving'.  Symptoms moderate, persistent, constant, without acute or abrupt change today or this week.  Patient w morbidly obese, hx sleep apnea/home o2, diabetes and htn.  States compliant w normal home meds including lasix. Urinating of normal amount. +orthopnea. No chest pain or discomfort. +non prod cough. No sore throat or runny nose. No fever or chills. Denies any unilateral leg swelling or pain.    The history is provided by the patient.  Leg Pain      Past Medical History:  Diagnosis Date  . Asthma   . COPD (chronic obstructive pulmonary disease) ([place])   . Diabetes mellitus without complication ([place])   . Hypertension   . Shortness of breath dyspnea   . Sleep apnea     Patient Active Problem List   Diagnosis Date Noted  . COPD with acute exacerbation ([place]) 06/17/2016  . Seasonal and perennial allergic rhinitis 05/24/2016  . AKI (acute kidney injury) ([place]) 04/08/2015  . Hypovolemic shock ([place]) 04/08/2015  . DKA, type 2 ([place]) 04/08/2015  . Sepsis ([place]) 04/08/2015  . Hyponatremia 04/08/2015  . DM (diabetes mellitus) ([place]) 03/28/2015  . HTN (hypertension) 03/28/2015  . Near syncope 03/28/2015  . Type 2 diabetes mellitus with hyperosmolar nonketotic hyperglycemia ([place]) 03/28/2015  . Hyperglycemia 03/27/2015  . Acute on chronic respiratory failure with hypoxemia ([place]) 03/27/2015  . Acute on chronic congestive heart failure ([place])   . COPD exacerbation ([place]) 02/07/2015  . Pressure ulcer 12/28/2014  . COPD mixed type ([place]) 12/27/2014  . Diabetes mellitus type 2, controlled ([place]) 12/27/2014  . Obesity, morbid ([place]) 07/29/2013  . Obstructive sleep apnea 07/29/2013  .  Peripheral edema 07/29/2013    History reviewed. No pertinent surgical history.     Home Medications    Prior to Admission medications   Medication Sig Start Date End Date Taking? Authorizing Provider  beclomethasone (QVAR) 40 MCG/ACT inhaler Inhale 2 puffs into the lungs daily. 07/18/13  Yes Piepenbrink, Anderson [place], PA-C  cholecalciferol (VITAMIN D) 1000 units tablet Take 1,000 Units by mouth daily.   Yes [provider]  fluticasone (FLONASE) 50 MCG/ACT nasal spray Place 2 sprays into both nostrils daily as needed for allergies.    Yes [provider]  furosemide (LASIX) 40 MG tablet Take 1 tablet (40 mg total) by mouth 2 (two) times daily. 10/10/16  Yes Virgel Manifold, MD  gemfibrozil (LOPID) 600 MG tablet Take 600 mg by mouth 2 (two) times daily before a meal. Thirty minutes before morning and evening meal.   Yes [provider]  guaiFENesin (MUCINEX) 600 MG 12 hr tablet Take 1 tablet (600 mg total) by mouth 2 (two) times daily. 06/21/16  Yes Barton Dubois, MD  insulin glargine (LANTUS) 100 UNIT/ML injection Inject 0.6 mLs (60 Units total) into the skin daily. 04/12/15  Yes Vann, Jessica U, DO  Ipratropium-Albuterol (COMBIVENT RESPIMAT) 20-100 MCG/ACT AERS respimat Inhale 1 puff into the lungs every 6 (six) hours as needed for wheezing or shortness of breath. 06/21/16  Yes Barton Dubois, MD  ipratropium-albuterol (DUONEB) 0.5-2.5 (3) MG/3ML SOLN Take 3 mLs by nebulization every 6 (six) hours as needed. Patient taking differently: Take 3 mLs by nebulization  every 6 (six) hours as needed (sob).  07/29/16  Yes Young, Tarri Fuller D, MD  pantoprazole (PROTONIX) 40 MG tablet Take 1 tablet (40 mg total) by mouth daily at 12 noon. 06/22/16  Yes Barton Dubois, MD  potassium chloride SA (K-DUR,KLOR-CON) 20 MEQ tablet Take 1 tablet (20 mEq total) by mouth 2 (two) times daily. 10/10/16  Yes Virgel Manifold, MD  triamcinolone cream (KENALOG) 0.1 % Apply 1 application topically daily.   Yes  [provider]  blood glucose meter kit and supplies Dispense based on patient and insurance preference. Use up to four times daily as directed. (FOR ICD-9 250.00, 250.01). 03/29/15   [name], MD  furosemide (LASIX) 40 MG tablet Take 1 tablet (40 mg total) by mouth 2 (two) times daily. Patient not taking: Reported on 01/20/2017 10/10/16   Virgel Manifold, MD  levofloxacin (LEVAQUIN) 750 MG tablet Take 1 tablet (750 mg total) by mouth daily. 06/22/16   Barton Dubois, MD  loratadine (CLARITIN) 10 MG tablet 1 daily for allergy while needed Patient taking differently: Take 10 mg by mouth daily as needed for allergies.  11/25/16   Deneise Lever, MD    Family History Family History  Problem Relation Age of Onset  . Asthma Sister   . Asthma Other        nephew  . Hypertension Sister   . Diabetes type II Sister     Social History Social History   Tobacco Use  . Smoking status: Former Smoker    Packs/day: 0.50    Years: 0.50    Pack years: 0.25    Types: Cigarettes    Start date: 07/28/1983    Last attempt to quit: 02/27/2013    Years since quitting: 3.8  . Smokeless tobacco: Never Used  Substance Use Topics  . Alcohol use: No    Comment: quit ETOH about 5 months ago-beer and liquor  . Drug use: No    Comment: quit 6 months-"pot"-smoked couple joints a week     Allergies   Patient has no known allergies.   Review of Systems Review of Systems  Constitutional: Negative for fever.  HENT: Negative for sore throat.   Eyes: Negative for redness.  Respiratory: Positive for cough and shortness of breath.   Cardiovascular: Positive for leg swelling. Negative for chest pain.  Gastrointestinal: Negative for abdominal pain and vomiting.  Endocrine: Negative for polyuria.  Genitourinary: Negative for dysuria and flank pain.  Musculoskeletal: Negative for back pain and neck pain.  Skin: Negative for rash.  Neurological: Negative for headaches.  Hematological: Does not  bruise/bleed easily.  Psychiatric/Behavioral: Negative for confusion.     Physical Exam Updated Vital Signs BP 123/63 (BP Location: Left Arm)   Pulse 100   Temp 98 F (36.7 C) (Oral)   Resp 18   Ht 1.727 m ('5\' 8"' )   Wt (!) 166 kg (366 lb)   SpO2 93%   BMI 55.65 kg/m   Physical Exam  Constitutional: He is oriented to person, place, and time. He appears well-developed and well-nourished. No distress.  HENT:  Mouth/Throat: Oropharynx is clear and moist.  Eyes: Conjunctivae are normal.  Neck: Neck supple. No tracheal deviation present.  Cardiovascular: Normal rate, regular rhythm, normal heart sounds and intact distal pulses. Exam reveals no gallop and no friction rub.  No murmur heard. Pulmonary/Chest: Effort normal and breath sounds normal. No accessory muscle usage. No respiratory distress.  Abdominal: Soft. Bowel sounds are normal. He exhibits no distension.  There is no tenderness.  Morbidly obese.  Genitourinary:  Genitourinary Comments: No cva tenderness  Musculoskeletal:  Bilateral leg edema.   Neurological: He is alert and oriented to person, place, and time.  Skin: Skin is warm and dry. No rash noted. He is not diaphoretic.  Psychiatric: He has a normal mood and affect.  Nursing note and vitals reviewed.    ED Treatments / Results  Labs (all labs ordered are listed, but only abnormal results are displayed) Results for orders placed or performed during the hospital encounter of 01/20/17  Comprehensive metabolic panel  Result Value Ref Range   Sodium 139 135 - 145 mmol/L   Potassium 4.4 3.5 - 5.1 mmol/L   Chloride 94 (L) 101 - 111 mmol/L   CO2 38 (H) 22 - 32 mmol/L   Glucose, Bld 110 (H) 65 - 99 mg/dL   BUN 19 6 - 20 mg/dL   Creatinine, Ser 0.90 0.61 - 1.24 mg/dL   Calcium 9.3 8.9 - 10.3 mg/dL   Total Protein 8.5 (H) 6.5 - 8.1 g/dL   Albumin 3.3 (L) 3.5 - 5.0 g/dL   AST 23 15 - 41 U/L   ALT 12 (L) 17 - 63 U/L   Alkaline Phosphatase 65 38 - 126 U/L   Total  Bilirubin 0.6 0.3 - 1.2 mg/dL   GFR calc non Af Amer >60 >60 mL/min   GFR calc Af Amer >60 >60 mL/min   Anion gap 7 5 - 15  CBC  Result Value Ref Range   WBC 10.2 4.0 - 10.5 K/uL   RBC 4.01 (L) 4.22 - 5.81 MIL/uL   Hemoglobin 11.0 (L) 13.0 - 17.0 g/dL   HCT 37.9 (L) 39.0 - 52.0 %   MCV 94.5 78.0 - 100.0 fL   MCH 27.4 26.0 - 34.0 pg   MCHC 29.0 (L) 30.0 - 36.0 g/dL   RDW 17.8 (H) 11.5 - 15.5 %   Platelets 296 150 - 400 K/uL  Brain natriuretic peptide  Result Value Ref Range   B Natriuretic Peptide 15.4 0.0 - 100.0 pg/mL    EKG  EKG Interpretation  Date/Time:  Wednesday January 20 2017 10:53:56 EST Ventricular Rate:  102 PR Interval:    QRS Duration: 97 QT Interval:  341 QTC Calculation: 445 R Axis:   96 Text Interpretation:  Sinus tachycardia Rightward axis No significant change since last tracing Confirmed by Lajean Saver [phone]) on 01/20/2017 11:17:13 AM       Radiology Dg Chest 2 View  Result Date: 01/20/2017 CLINICAL DATA:  Bilateral lower extremity pain and edema for the past 2 weeks. Worsening shortness of breath. EXAM: CHEST  2 VIEW COMPARISON:  10/10/2016 ; 12/28/2014; chest CT- 12/28/2014 FINDINGS: Examination is degraded due to patient body habitus. Grossly unchanged enlarged cardiac silhouette and mediastinal contours. Mild cephalization of flow. Worsening bilateral perihilar heterogeneous opacities, right greater than left. No pneumothorax or definite pleural effusion. No acute osseus abnormalities. Stigmata of DISH within the thoracic spine. IMPRESSION: Suspected mild pulmonary edema and bilateral infrahilar atelectasis on this body habitus degraded examination. Electronically Signed   By: Sandi Mariscal M.D.   On: 01/20/2017 07:53    Procedures Procedures (including critical care time)  Medications Ordered in ED Medications  0.9 %  sodium chloride infusion (not administered)     Initial Impression / Assessment and Plan / ED Course  I have reviewed the triage  vital signs and the nursing notes.  Pertinent labs & imaging results that were  available during my care of the patient were reviewed by me and considered in my medical decision making (see chart for details).  Iv ns. Labs. Cxr.  Reviewed nursing notes and prior charts for additional history.   ?vascular congestion on cxr.  Lasix iv.   Good urine output in ED.    Pt asking for icechips/drink - provided.  No chest pain or discomfort. Afebile. Pulse ox 97%.  Pt currently appears stable for d/c.  rec close outpatient pcp f/u.   Return precautions provided.     Final Clinical Impressions(s) / ED Diagnoses   Final diagnoses:  None    ED Discharge Orders    None       Lajean Saver, MD 01/20/17 1200

## 2017-06-09 ENCOUNTER — Encounter (HOSPITAL_COMMUNITY): Payer: Self-pay | Admitting: *Deleted

## 2017-06-09 ENCOUNTER — Inpatient Hospital Stay (HOSPITAL_COMMUNITY)
Admission: EM | Admit: 2017-06-09 | Discharge: 2017-07-15 | DRG: 004 | Disposition: A | Discharge status: Rehabilitation Facility | Payer: Medicaid Other | Attending: Family Medicine | Admitting: Family Medicine

## 2017-06-09 ENCOUNTER — Emergency Department (HOSPITAL_COMMUNITY): Payer: Medicaid Other

## 2017-06-09 DIAGNOSIS — E1169 Type 2 diabetes mellitus with other specified complication: Secondary | ICD-10-CM | POA: Diagnosis not present

## 2017-06-09 DIAGNOSIS — J3089 Other allergic rhinitis: Secondary | ICD-10-CM | POA: Diagnosis present

## 2017-06-09 DIAGNOSIS — I878 Other specified disorders of veins: Secondary | ICD-10-CM | POA: Diagnosis present

## 2017-06-09 DIAGNOSIS — R6521 Severe sepsis with septic shock: Secondary | ICD-10-CM | POA: Diagnosis not present

## 2017-06-09 DIAGNOSIS — T17990A Other foreign object in respiratory tract, part unspecified in causing asphyxiation, initial encounter: Secondary | ICD-10-CM | POA: Diagnosis not present

## 2017-06-09 DIAGNOSIS — J189 Pneumonia, unspecified organism: Secondary | ICD-10-CM | POA: Diagnosis present

## 2017-06-09 DIAGNOSIS — B370 Candidal stomatitis: Secondary | ICD-10-CM | POA: Diagnosis not present

## 2017-06-09 DIAGNOSIS — J9622 Acute and chronic respiratory failure with hypercapnia: Secondary | ICD-10-CM | POA: Diagnosis not present

## 2017-06-09 DIAGNOSIS — K5900 Constipation, unspecified: Secondary | ICD-10-CM | POA: Diagnosis present

## 2017-06-09 DIAGNOSIS — E874 Mixed disorder of acid-base balance: Secondary | ICD-10-CM | POA: Diagnosis present

## 2017-06-09 DIAGNOSIS — R112 Nausea with vomiting, unspecified: Secondary | ICD-10-CM | POA: Diagnosis not present

## 2017-06-09 DIAGNOSIS — J398 Other specified diseases of upper respiratory tract: Secondary | ICD-10-CM | POA: Diagnosis not present

## 2017-06-09 DIAGNOSIS — Z833 Family history of diabetes mellitus: Secondary | ICD-10-CM

## 2017-06-09 DIAGNOSIS — Z6841 Body Mass Index (BMI) 40.0 and over, adult: Secondary | ICD-10-CM | POA: Diagnosis not present

## 2017-06-09 DIAGNOSIS — E1122 Type 2 diabetes mellitus with diabetic chronic kidney disease: Secondary | ICD-10-CM | POA: Diagnosis present

## 2017-06-09 DIAGNOSIS — I130 Hypertensive heart and chronic kidney disease with heart failure and stage 1 through stage 4 chronic kidney disease, or unspecified chronic kidney disease: Secondary | ICD-10-CM | POA: Diagnosis present

## 2017-06-09 DIAGNOSIS — Y95 Nosocomial condition: Secondary | ICD-10-CM | POA: Diagnosis present

## 2017-06-09 DIAGNOSIS — E119 Type 2 diabetes mellitus without complications: Secondary | ICD-10-CM

## 2017-06-09 DIAGNOSIS — Z79899 Other long term (current) drug therapy: Secondary | ICD-10-CM | POA: Diagnosis not present

## 2017-06-09 DIAGNOSIS — I5032 Chronic diastolic (congestive) heart failure: Secondary | ICD-10-CM | POA: Diagnosis present

## 2017-06-09 DIAGNOSIS — G4733 Obstructive sleep apnea (adult) (pediatric): Secondary | ICD-10-CM | POA: Diagnosis present

## 2017-06-09 DIAGNOSIS — Z781 Physical restraint status: Secondary | ICD-10-CM

## 2017-06-09 DIAGNOSIS — D638 Anemia in other chronic diseases classified elsewhere: Secondary | ICD-10-CM | POA: Diagnosis not present

## 2017-06-09 DIAGNOSIS — G9349 Other encephalopathy: Secondary | ICD-10-CM | POA: Diagnosis present

## 2017-06-09 DIAGNOSIS — R001 Bradycardia, unspecified: Secondary | ICD-10-CM | POA: Diagnosis not present

## 2017-06-09 DIAGNOSIS — Z7952 Long term (current) use of systemic steroids: Secondary | ICD-10-CM | POA: Diagnosis not present

## 2017-06-09 DIAGNOSIS — J9811 Atelectasis: Secondary | ICD-10-CM | POA: Diagnosis present

## 2017-06-09 DIAGNOSIS — B9562 Methicillin resistant Staphylococcus aureus infection as the cause of diseases classified elsewhere: Secondary | ICD-10-CM | POA: Diagnosis present

## 2017-06-09 DIAGNOSIS — J449 Chronic obstructive pulmonary disease, unspecified: Secondary | ICD-10-CM

## 2017-06-09 DIAGNOSIS — Z4659 Encounter for fitting and adjustment of other gastrointestinal appliance and device: Secondary | ICD-10-CM

## 2017-06-09 DIAGNOSIS — Z87891 Personal history of nicotine dependence: Secondary | ICD-10-CM

## 2017-06-09 DIAGNOSIS — J9612 Chronic respiratory failure with hypercapnia: Secondary | ICD-10-CM | POA: Diagnosis not present

## 2017-06-09 DIAGNOSIS — R069 Unspecified abnormalities of breathing: Secondary | ICD-10-CM

## 2017-06-09 DIAGNOSIS — R0603 Acute respiratory distress: Secondary | ICD-10-CM

## 2017-06-09 DIAGNOSIS — J9611 Chronic respiratory failure with hypoxia: Secondary | ICD-10-CM | POA: Diagnosis not present

## 2017-06-09 DIAGNOSIS — Z9114 Patient's other noncompliance with medication regimen: Secondary | ICD-10-CM

## 2017-06-09 DIAGNOSIS — Z9119 Patient's noncompliance with other medical treatment and regimen: Secondary | ICD-10-CM

## 2017-06-09 DIAGNOSIS — J209 Acute bronchitis, unspecified: Secondary | ICD-10-CM | POA: Diagnosis present

## 2017-06-09 DIAGNOSIS — L89304 Pressure ulcer of unspecified buttock, stage 4: Secondary | ICD-10-CM | POA: Diagnosis present

## 2017-06-09 DIAGNOSIS — J9600 Acute respiratory failure, unspecified whether with hypoxia or hypercapnia: Secondary | ICD-10-CM

## 2017-06-09 DIAGNOSIS — Z825 Family history of asthma and other chronic lower respiratory diseases: Secondary | ICD-10-CM

## 2017-06-09 DIAGNOSIS — J9621 Acute and chronic respiratory failure with hypoxia: Secondary | ICD-10-CM | POA: Diagnosis present

## 2017-06-09 DIAGNOSIS — E878 Other disorders of electrolyte and fluid balance, not elsewhere classified: Secondary | ICD-10-CM | POA: Diagnosis not present

## 2017-06-09 DIAGNOSIS — Z431 Encounter for attention to gastrostomy: Secondary | ICD-10-CM | POA: Diagnosis not present

## 2017-06-09 DIAGNOSIS — T884XXA Failed or difficult intubation, initial encounter: Secondary | ICD-10-CM

## 2017-06-09 DIAGNOSIS — R32 Unspecified urinary incontinence: Secondary | ICD-10-CM | POA: Diagnosis present

## 2017-06-09 DIAGNOSIS — J9601 Acute respiratory failure with hypoxia: Secondary | ICD-10-CM | POA: Diagnosis not present

## 2017-06-09 DIAGNOSIS — J80 Acute respiratory distress syndrome: Secondary | ICD-10-CM | POA: Diagnosis present

## 2017-06-09 DIAGNOSIS — E1165 Type 2 diabetes mellitus with hyperglycemia: Secondary | ICD-10-CM | POA: Diagnosis not present

## 2017-06-09 DIAGNOSIS — E1142 Type 2 diabetes mellitus with diabetic polyneuropathy: Secondary | ICD-10-CM | POA: Diagnosis present

## 2017-06-09 DIAGNOSIS — L02214 Cutaneous abscess of groin: Secondary | ICD-10-CM | POA: Diagnosis not present

## 2017-06-09 DIAGNOSIS — Z7951 Long term (current) use of inhaled steroids: Secondary | ICD-10-CM

## 2017-06-09 DIAGNOSIS — E876 Hypokalemia: Secondary | ICD-10-CM | POA: Diagnosis not present

## 2017-06-09 DIAGNOSIS — R4702 Dysphasia: Secondary | ICD-10-CM | POA: Diagnosis not present

## 2017-06-09 DIAGNOSIS — Z794 Long term (current) use of insulin: Secondary | ICD-10-CM

## 2017-06-09 DIAGNOSIS — Z8249 Family history of ischemic heart disease and other diseases of the circulatory system: Secondary | ICD-10-CM

## 2017-06-09 DIAGNOSIS — R5381 Other malaise: Secondary | ICD-10-CM | POA: Diagnosis not present

## 2017-06-09 DIAGNOSIS — Y92230 Patient room in hospital as the place of occurrence of the external cause: Secondary | ICD-10-CM | POA: Diagnosis not present

## 2017-06-09 DIAGNOSIS — I272 Pulmonary hypertension, unspecified: Secondary | ICD-10-CM | POA: Diagnosis present

## 2017-06-09 DIAGNOSIS — J441 Chronic obstructive pulmonary disease with (acute) exacerbation: Secondary | ICD-10-CM | POA: Diagnosis present

## 2017-06-09 DIAGNOSIS — N183 Chronic kidney disease, stage 3 (moderate): Secondary | ICD-10-CM | POA: Diagnosis present

## 2017-06-09 DIAGNOSIS — K5790 Diverticulosis of intestine, part unspecified, without perforation or abscess without bleeding: Secondary | ICD-10-CM | POA: Diagnosis present

## 2017-06-09 DIAGNOSIS — J810 Acute pulmonary edema: Secondary | ICD-10-CM

## 2017-06-09 DIAGNOSIS — E669 Obesity, unspecified: Secondary | ICD-10-CM | POA: Diagnosis not present

## 2017-06-09 DIAGNOSIS — F1290 Cannabis use, unspecified, uncomplicated: Secondary | ICD-10-CM | POA: Diagnosis present

## 2017-06-09 DIAGNOSIS — J440 Chronic obstructive pulmonary disease with acute lower respiratory infection: Secondary | ICD-10-CM | POA: Diagnosis present

## 2017-06-09 DIAGNOSIS — Z931 Gastrostomy status: Secondary | ICD-10-CM | POA: Diagnosis not present

## 2017-06-09 DIAGNOSIS — T50995A Adverse effect of other drugs, medicaments and biological substances, initial encounter: Secondary | ICD-10-CM | POA: Diagnosis not present

## 2017-06-09 DIAGNOSIS — D6489 Other specified anemias: Secondary | ICD-10-CM | POA: Diagnosis present

## 2017-06-09 DIAGNOSIS — E662 Morbid (severe) obesity with alveolar hypoventilation: Secondary | ICD-10-CM | POA: Diagnosis present

## 2017-06-09 DIAGNOSIS — J9690 Respiratory failure, unspecified, unspecified whether with hypoxia or hypercapnia: Secondary | ICD-10-CM

## 2017-06-09 DIAGNOSIS — A419 Sepsis, unspecified organism: Secondary | ICD-10-CM | POA: Diagnosis not present

## 2017-06-09 DIAGNOSIS — F419 Anxiety disorder, unspecified: Secondary | ICD-10-CM | POA: Diagnosis not present

## 2017-06-09 DIAGNOSIS — I5033 Acute on chronic diastolic (congestive) heart failure: Secondary | ICD-10-CM | POA: Diagnosis not present

## 2017-06-09 DIAGNOSIS — E785 Hyperlipidemia, unspecified: Secondary | ICD-10-CM | POA: Diagnosis not present

## 2017-06-09 DIAGNOSIS — Z930 Tracheostomy status: Secondary | ICD-10-CM | POA: Diagnosis not present

## 2017-06-09 DIAGNOSIS — L89154 Pressure ulcer of sacral region, stage 4: Secondary | ICD-10-CM | POA: Diagnosis present

## 2017-06-09 DIAGNOSIS — E118 Type 2 diabetes mellitus with unspecified complications: Secondary | ICD-10-CM | POA: Diagnosis not present

## 2017-06-09 DIAGNOSIS — D62 Acute posthemorrhagic anemia: Secondary | ICD-10-CM | POA: Diagnosis not present

## 2017-06-09 DIAGNOSIS — N179 Acute kidney failure, unspecified: Secondary | ICD-10-CM | POA: Diagnosis not present

## 2017-06-09 DIAGNOSIS — J9602 Acute respiratory failure with hypercapnia: Secondary | ICD-10-CM | POA: Diagnosis not present

## 2017-06-09 DIAGNOSIS — S31000S Unspecified open wound of lower back and pelvis without penetration into retroperitoneum, sequela: Secondary | ICD-10-CM | POA: Diagnosis not present

## 2017-06-09 DIAGNOSIS — Z79890 Hormone replacement therapy (postmenopausal): Secondary | ICD-10-CM

## 2017-06-09 DIAGNOSIS — R1310 Dysphagia, unspecified: Secondary | ICD-10-CM | POA: Diagnosis present

## 2017-06-09 DIAGNOSIS — L8990 Pressure ulcer of unspecified site, unspecified stage: Secondary | ICD-10-CM | POA: Diagnosis present

## 2017-06-09 DIAGNOSIS — I10 Essential (primary) hypertension: Secondary | ICD-10-CM | POA: Diagnosis not present

## 2017-06-09 DIAGNOSIS — Z6831 Body mass index (BMI) 31.0-31.9, adult: Secondary | ICD-10-CM

## 2017-06-09 DIAGNOSIS — G4710 Hypersomnia, unspecified: Secondary | ICD-10-CM | POA: Diagnosis not present

## 2017-06-09 DIAGNOSIS — R740 Nonspecific elevation of levels of transaminase and lactic acid dehydrogenase [LDH]: Secondary | ICD-10-CM | POA: Diagnosis not present

## 2017-06-09 DIAGNOSIS — B9622 Other specified Shiga toxin-producing Escherichia coli [E. coli] (STEC) as the cause of diseases classified elsewhere: Secondary | ICD-10-CM | POA: Diagnosis present

## 2017-06-09 DIAGNOSIS — R0989 Other specified symptoms and signs involving the circulatory and respiratory systems: Secondary | ICD-10-CM | POA: Diagnosis not present

## 2017-06-09 DIAGNOSIS — J9691 Respiratory failure, unspecified with hypoxia: Secondary | ICD-10-CM | POA: Diagnosis not present

## 2017-06-09 HISTORY — DX: Chronic respiratory failure with hypoxia: J96.11

## 2017-06-09 LAB — BLOOD GAS, ARTERIAL
ACID-BASE EXCESS: 21.4 mmol/L — AB (ref 0.0–2.0)
ACID-BASE EXCESS: 23.7 mmol/L — AB (ref 0.0–2.0)
Acid-Base Excess: 25 mmol/L — ABNORMAL HIGH (ref 0.0–2.0)
BICARBONATE: 52.2 mmol/L — AB (ref 20.0–28.0)
BICARBONATE: 52.5 mmol/L — AB (ref 20.0–28.0)
Bicarbonate: 50.6 mmol/L — ABNORMAL HIGH (ref 20.0–28.0)
DELIVERY SYSTEMS: POSITIVE
DELIVERY SYSTEMS: POSITIVE
DRAWN BY: 518061
Drawn by: 51806
Drawn by: 518061
EXPIRATORY PAP: 5
Expiratory PAP: 5
FIO2: 100
FIO2: 35
FIO2: 35
INSPIRATORY PAP: 20
Inspiratory PAP: 12
Mode: POSITIVE
O2 SAT: 99.2 %
O2 SAT: 84.6 %
O2 Saturation: 88.9 %
PATIENT TEMPERATURE: 98.6
PEEP: 5 cmH2O
PH ART: 7.149 — AB (ref 7.350–7.450)
PH ART: 7.22 — AB (ref 7.350–7.450)
PH ART: 7.312 — AB (ref 7.350–7.450)
PO2 ART: 233 mmHg — AB (ref 83.0–108.0)
PO2 ART: 58.2 mmHg — AB (ref 83.0–108.0)
PRESSURE CONTROL: 20 cmH2O
Patient temperature: 98.6
Patient temperature: 98.6
RATE: 16 resp/min
pCO2 arterial: 107 mmHg (ref 32.0–48.0)
pO2, Arterial: 60.4 mmHg — ABNORMAL LOW (ref 83.0–108.0)

## 2017-06-09 LAB — GLUCOSE, CAPILLARY
GLUCOSE-CAPILLARY: 103 mg/dL — AB (ref 65–99)
Glucose-Capillary: 112 mg/dL — ABNORMAL HIGH (ref 65–99)

## 2017-06-09 LAB — CBC WITH DIFFERENTIAL/PLATELET
Abs Immature Granulocytes: 0.1 10*3/uL (ref 0.0–0.1)
BASOS ABS: 0 10*3/uL (ref 0.0–0.1)
Basophils Relative: 0 %
EOS PCT: 1 %
Eosinophils Absolute: 0 10*3/uL (ref 0.0–0.7)
HEMATOCRIT: 44.7 % (ref 39.0–52.0)
HEMOGLOBIN: 11.8 g/dL — AB (ref 13.0–17.0)
Immature Granulocytes: 1 %
LYMPHS ABS: 0.8 10*3/uL (ref 0.7–4.0)
LYMPHS PCT: 16 %
MCH: 26.5 pg (ref 26.0–34.0)
MCHC: 26.4 g/dL — AB (ref 30.0–36.0)
MCV: 100.2 fL — AB (ref 78.0–100.0)
MONO ABS: 0.4 10*3/uL (ref 0.1–1.0)
MONOS PCT: 9 %
NEUTROS PCT: 73 %
Neutro Abs: 3.4 10*3/uL (ref 1.7–7.7)
Platelets: 208 10*3/uL (ref 150–400)
RBC: 4.46 MIL/uL (ref 4.22–5.81)
RDW: 17.6 % — ABNORMAL HIGH (ref 11.5–15.5)
WBC: 4.7 10*3/uL (ref 4.0–10.5)

## 2017-06-09 LAB — COMPREHENSIVE METABOLIC PANEL
ALK PHOS: 69 U/L (ref 38–126)
ALT: 11 U/L — ABNORMAL LOW (ref 17–63)
ANION GAP: 8 (ref 5–15)
AST: 20 U/L (ref 15–41)
Albumin: 3.1 g/dL — ABNORMAL LOW (ref 3.5–5.0)
BILIRUBIN TOTAL: 0.6 mg/dL (ref 0.3–1.2)
BUN: 21 mg/dL — ABNORMAL HIGH (ref 6–20)
CALCIUM: 9.2 mg/dL (ref 8.9–10.3)
CO2: 46 mmol/L — ABNORMAL HIGH (ref 22–32)
CREATININE: 1.18 mg/dL (ref 0.61–1.24)
Chloride: 87 mmol/L — ABNORMAL LOW (ref 101–111)
GFR calc non Af Amer: >60 mL/min (ref >60)
Glucose, Bld: 109 mg/dL — ABNORMAL HIGH (ref 65–99)
Potassium: 4.7 mmol/L (ref 3.5–5.1)
Sodium: 141 mmol/L (ref 135–145)
TOTAL PROTEIN: 8.2 g/dL — AB (ref 6.5–8.1)

## 2017-06-09 LAB — HEMOGLOBIN A1C
HEMOGLOBIN A1C: 5.4 % (ref 4.8–5.6)
Mean Plasma Glucose: 108.28 mg/dL

## 2017-06-09 LAB — RAPID URINE DRUG SCREEN, HOSP PERFORMED
AMPHETAMINES: NOT DETECTED
BARBITURATES: NOT DETECTED
Benzodiazepines: NOT DETECTED
Cocaine: NOT DETECTED
Opiates: NOT DETECTED
TETRAHYDROCANNABINOL: NOT DETECTED

## 2017-06-09 LAB — I-STAT TROPONIN, ED: TROPONIN I, POC: 0.08 ng/mL (ref 0.00–0.08)

## 2017-06-09 LAB — BRAIN NATRIURETIC PEPTIDE: B NATRIURETIC PEPTIDE 5: 268.3 pg/mL — AB (ref 0.0–100.0)

## 2017-06-09 LAB — TSH: TSH: 0.554 u[IU]/mL (ref 0.350–4.500)

## 2017-06-09 LAB — MAGNESIUM: Magnesium: 1.9 mg/dL (ref 1.7–2.4)

## 2017-06-09 LAB — TROPONIN I: Troponin I: 0.06 ng/mL (ref <0.03)

## 2017-06-09 LAB — D-DIMER, QUANTITATIVE: D-Dimer, Quant: 2.06 ug/mL-FEU — ABNORMAL HIGH (ref 0.00–0.50)

## 2017-06-09 MED ORDER — SODIUM CHLORIDE 0.9 % IV SOLN
250.0000 mL | INTRAVENOUS | Status: DC | PRN
  Administered 2017-06-10 – 2017-06-26 (×5): 250 mL via INTRAVENOUS

## 2017-06-09 MED ORDER — ACETAMINOPHEN 650 MG RE SUPP: 650.0000 mg | Freq: Four times a day (QID) | RECTAL | Status: DC | PRN

## 2017-06-09 MED ORDER — SODIUM CHLORIDE 0.9% FLUSH
3.0000 mL | Freq: Two times a day (BID) | INTRAVENOUS | Status: DC
  Administered 2017-06-10 – 2017-06-28 (×30): 3 mL via INTRAVENOUS
  Administered 2017-06-28: 20 mL via INTRAVENOUS
  Administered 2017-06-29 – 2017-07-08 (×10): 3 mL via INTRAVENOUS
  Administered 2017-07-12: 10 mL via INTRAVENOUS
  Administered 2017-07-12 – 2017-07-15 (×5): 3 mL via INTRAVENOUS

## 2017-06-09 MED ORDER — ONDANSETRON HCL 4 MG/2ML IJ SOLN: 4.0000 mg | Freq: Four times a day (QID) | INTRAMUSCULAR | Status: DC | PRN

## 2017-06-09 MED ORDER — LEVOFLOXACIN IN D5W 750 MG/150ML IV SOLN
750.0000 mg | INTRAVENOUS | Status: DC
  Administered 2017-06-09: 750 mg via INTRAVENOUS
  Filled 2017-06-09: qty 150

## 2017-06-09 MED ORDER — NITROGLYCERIN IN D5W 200-5 MCG/ML-% IV SOLN
0.0000 ug/min | Freq: Once | INTRAVENOUS | Status: AC
  Administered 2017-06-09: 10 ug/min via INTRAVENOUS
  Filled 2017-06-09: qty 250

## 2017-06-09 MED ORDER — FUROSEMIDE 10 MG/ML IJ SOLN
60.0000 mg | Freq: Once | INTRAMUSCULAR | Status: AC
  Administered 2017-06-09: 60 mg via INTRAVENOUS
  Filled 2017-06-09: qty 6

## 2017-06-09 MED ORDER — ACETAMINOPHEN 325 MG PO TABS: 650.0000 mg | ORAL_TABLET | Freq: Four times a day (QID) | ORAL | Status: DC | PRN

## 2017-06-09 MED ORDER — METHYLPREDNISOLONE SODIUM SUCC 125 MG IJ SOLR
125.0000 mg | Freq: Once | INTRAMUSCULAR | Status: AC
  Administered 2017-06-09: 125 mg via INTRAVENOUS
  Filled 2017-06-09: qty 2

## 2017-06-09 MED ORDER — METHYLPREDNISOLONE SODIUM SUCC 125 MG IJ SOLR: 125.0000 mg | Freq: Every day | INTRAMUSCULAR | Status: DC

## 2017-06-09 MED ORDER — INSULIN ASPART 100 UNIT/ML ~~LOC~~ SOLN: 0.0000 [IU] | SUBCUTANEOUS | Status: DC

## 2017-06-09 MED ORDER — ONDANSETRON HCL 4 MG PO TABS: 4.0000 mg | ORAL_TABLET | Freq: Four times a day (QID) | ORAL | Status: DC | PRN

## 2017-06-09 MED ORDER — ENOXAPARIN SODIUM 40 MG/0.4ML ~~LOC~~ SOLN
40.0000 mg | SUBCUTANEOUS | Status: DC
  Administered 2017-06-09 – 2017-06-21 (×12): 40 mg via SUBCUTANEOUS
  Filled 2017-06-09 (×12): qty 0.4

## 2017-06-09 MED ORDER — INSULIN GLARGINE 100 UNIT/ML ~~LOC~~ SOLN
10.0000 [IU] | Freq: Every day | SUBCUTANEOUS | Status: DC
  Administered 2017-06-09 – 2017-07-14 (×35): 10 [IU] via SUBCUTANEOUS
  Filled 2017-06-09 (×37): qty 0.1

## 2017-06-09 MED ORDER — IPRATROPIUM-ALBUTEROL 0.5-2.5 (3) MG/3ML IN SOLN
3.0000 mL | Freq: Four times a day (QID) | RESPIRATORY_TRACT | Status: DC
  Administered 2017-06-09 – 2017-07-02 (×89): 3 mL via RESPIRATORY_TRACT
  Filled 2017-06-09 (×85): qty 3

## 2017-06-09 MED ORDER — POLYETHYLENE GLYCOL 3350 17 G PO PACK
17.0000 g | PACK | Freq: Every day | ORAL | Status: DC | PRN
  Administered 2017-06-10: 17 g via ORAL
  Filled 2017-06-09: qty 1

## 2017-06-09 MED ORDER — ALBUTEROL SULFATE (2.5 MG/3ML) 0.083% IN NEBU
2.5000 mg | INHALATION_SOLUTION | RESPIRATORY_TRACT | Status: DC | PRN
  Administered 2017-06-12: 2.5 mg via RESPIRATORY_TRACT
  Filled 2017-06-09 (×3): qty 3

## 2017-06-09 MED ORDER — SODIUM CHLORIDE 0.9% FLUSH
3.0000 mL | INTRAVENOUS | Status: DC | PRN
  Administered 2017-06-13 – 2017-07-12 (×3): 3 mL via INTRAVENOUS
  Filled 2017-06-09 (×4): qty 3

## 2017-06-09 MED ORDER — FUROSEMIDE 10 MG/ML IJ SOLN
80.0000 mg | Freq: Two times a day (BID) | INTRAMUSCULAR | Status: DC
  Administered 2017-06-10 – 2017-06-12 (×7): 80 mg via INTRAVENOUS
  Filled 2017-06-09 (×7): qty 8

## 2017-06-09 MED ORDER — ALBUTEROL (5 MG/ML) CONTINUOUS INHALATION SOLN
10.0000 mg/h | INHALATION_SOLUTION | RESPIRATORY_TRACT | Status: DC
  Administered 2017-06-09: 10 mg/h via RESPIRATORY_TRACT
  Filled 2017-06-09: qty 20

## 2017-06-09 NOTE — ED Triage Notes (Signed)
Pt from private residence via [place], per report pts roommate called a friend b/c pt was not "acting right" friend then called EMS, EMS reports rales on arrival, pt refusing care initially, pt answering questions appropriately per EMS, pt on CPAP upon arrival, pt incontinent of urine and bowel,. Pt alert

## 2017-06-09 NOTE — Progress Notes (Signed)
Critical ABG results reported to Dr. Vanita Panda.

## 2017-06-09 NOTE — H&P (Addendum)
[facility] Admission History and Physical Service Pager: [phone]  Patient name: Timothy Le Medical record number: [id number] Date of birth: 09-Dec-1960 Age: 57 y.o. Gender: male  Primary Care Provider: Kerin Perna, NP Consultants: none Code Status: FULL  Chief Complaint: hard time breathing  Assessment and Plan: Timothy Le is a 57 y.o. male presenting with acute respiratory distress in the setting of worsening shortness of breath over several weeks. PMH is significant for morbid obesity, COPD on 2L [place], former tobacco use, OSA (noncompliant with CPAP), obesity hypoventilation syndrome, asthma, HTN, and Type 2 DM.     Respiratory Distress  COPD exacerbation  Possible new onset CHF Patient reports increased SoB for several days. Etiology likely multifactorial with history of COPD, OHS, OSA, and possibly new onset CHF. Patient also with severe pulmonary hypertension, but denies compliance with CPAP. No wheezing on exam although lungs difficult to auscultate give body habitus. Patient placed on BiPAP on arrival to ED.  ABG improved during ED stay slightly with pH at 7.22 3 hours after initial.  BNP elevated to 268.3 (given obesity likely not accurate). BNP's in past 9.8-26.9. No pitting edema in lower extremities, however patient reports subjective weight gain and swollen hands.  CXR showing Enlargement of cardiac silhouette.  Last Echo from 06/18/16 showing LVEF 65-70%. Patient on Lasix 40 mg twice daily at home although denies compliance recently. Possible ACS causing CHF exacerbation, however no chest pain, but initial troponin was 0.08, EKG unchanged with questionable ST elevation. Per last admission dry weight was ~366 lbs.  Well's score for PE is 0. Afebile and without leukocytosis so infectious etiology less likely.  COPD exacerbation less likely given lack of sputum, cough.  Heart score 6-7, will consider consultation to cardiology. Risk stratify  with TSH, Ha1c.  - f/u serial ABGs -Admit to step-down, attending Dr. Gwendlyn Deutscher -Continuous cardiac monitoring, pulse ox -Vitals per floor protocol -Daily weights, strict I's and O's -Supplemental O2 to keep O2 sats >90% - IV Lasix 80 mg twice daily - PT/OT to eval and treat - Continuous BIPAP - ECHO - Consider cardiology consult  - albuterol q2h PRN - Duonebs q6h - levofloxacin - EKG - Magnesium - SOLU-MEDROL 125 mg - F/u TSH, Ha1c - Troponin q6h x3 - Tylenol PRN - AM CMP  HTN: Patient placed on nitro drip for BP control.  Home medications unreliable given lack of compliance. Likely elevated in setting of fluid overload, multifactorial with OSA, COPD, potential CHF involved.  - Diuresing as above - Nitro gtt  Type 2 Diabetes: Last A1c 6.9 on 06/2016. On 60 U Lantus daily.  - f/u - 10 U Lantus  - resistant SSI  OSA - CPAP at night  FEN/GI: NPO Prophylaxis: enoxaparin  Disposition: pending, likely greater than two midnights in the management of acute respiratory distress   History of Present Illness:  Timothy Le is a 57 y.o. male presenting with increased shortness of breath and work of breathing.  He has a PMH significant for COPD, asthma, T2DM, HTN and OSA.  He was last hospitalized nearly a year ago with a similar presentation: fluid overload, echocardiogram wnl.  He reports he no longer walks and uses a walker to ambulate throughout his home.  He does not leave home and has groceries delivered to him by family members but lives alone and otherwise is responsible for his own care. He uses [place] at night, was instructed to use CPAP, but does not.  He says he ran out of his medications several weeks ago.  He normally takes potassium with his fluid pill.  He denies chest pain at any time prior to or during this admission.  Denies runny nose, cough, sputum productive, flu-like symptoms. Reports he has gained a lot of weight recently and his hands are swollen.   Does not smoke  currently, quit 4 years ago.  Smokes marijuana regularly, denies illegal drug use.   In the ED, patient was given ICS, albuterol, started on a nitro drip. CXR showed enlargement of cardiac silhouette, low lung volumes with bibasilar atelectasis.  BNP elevated to 268, troponin .08, ABG acidotic 7.149.  Review Of Systems: Per HPI with the following additions: reports HA, constipation   Review of Systems  Constitutional: Negative for chills and fever.  HENT: Negative for congestion.   Respiratory: Negative for cough and sputum production.   Cardiovascular: Positive for orthopnea and leg swelling. Negative for chest pain and palpitations.  Gastrointestinal: Positive for constipation. Negative for diarrhea and nausea.  Neurological: Negative for dizziness and headaches.    Patient Active Problem List   Diagnosis Date Noted  . Respiratory distress 06/09/2017  . COPD with acute exacerbation ([place]) 06/17/2016  . Seasonal and perennial allergic rhinitis 05/24/2016  . AKI (acute kidney injury) ([place]) 04/08/2015  . Hypovolemic shock ([place]) 04/08/2015  . DKA, type 2 ([place]) 04/08/2015  . Sepsis ([place]) 04/08/2015  . Hyponatremia 04/08/2015  . DM (diabetes mellitus) ([place]) 03/28/2015  . HTN (hypertension) 03/28/2015  . Near syncope 03/28/2015  . Type 2 diabetes mellitus with hyperosmolar nonketotic hyperglycemia ([place]) 03/28/2015  . Hyperglycemia 03/27/2015  . Acute on chronic respiratory failure with hypoxemia ([place]) 03/27/2015  . Acute on chronic congestive heart failure ([place])   . COPD exacerbation ([place]) 02/07/2015  . Pressure ulcer 12/28/2014  . COPD mixed type ([place]) 12/27/2014  . Diabetes mellitus type 2, controlled ([place]) 12/27/2014  . Obesity, morbid ([place]) 07/29/2013  . Obstructive sleep apnea 07/29/2013  . Peripheral edema 07/29/2013    Past Medical History: Past Medical History:  Diagnosis Date  . Asthma   . COPD (chronic obstructive pulmonary disease) ([place])   . Diabetes mellitus  without complication ([name])   . Hypertension   . Shortness of breath dyspnea   . Sleep apnea     Past Surgical History: History reviewed. No pertinent surgical history.  Social History: Social History   Tobacco Use  . Smoking status: Former Smoker    Packs/day: 0.50    Years: 0.50    Pack years: 0.25    Types: Cigarettes    Start date: 07/28/1983    Last attempt to quit: 02/27/2013    Years since quitting: 4.2  . Smokeless tobacco: Never Used  Substance Use Topics  . Alcohol use: No    Comment: quit ETOH about 5 months ago-beer and liquor  . Drug use: No    Comment: quit 6 months-"pot"-smoked couple joints a week   Additional social history: quit smoking 2015 Please also refer to relevant sections of EMR.  Family History: Family History  Problem Relation Age of Onset  . Asthma Sister   . Asthma Other        nephew  . Hypertension Sister   . Diabetes type II Sister     Allergies and Medications: No Known Allergies No current facility-administered medications on file prior to encounter.    Current Outpatient Medications on File Prior to Encounter  Medication Sig Dispense Refill  . beclomethasone (  QVAR) 40 MCG/ACT inhaler Inhale 2 puffs into the lungs daily. 1 Inhaler 12  . blood glucose meter kit and supplies Dispense based on patient and insurance preference. Use up to four times daily as directed. (FOR ICD-9 250.00, 250.01). 1 each 0  . cholecalciferol (VITAMIN D) 1000 units tablet Take 1,000 Units by mouth daily.    . fluticasone (FLONASE) 50 MCG/ACT nasal spray Place 2 sprays into both nostrils daily as needed for allergies.     . furosemide (LASIX) 40 MG tablet Take 1 tablet (40 mg total) by mouth 2 (two) times daily. 30 tablet 0  . furosemide (LASIX) 40 MG tablet Take 1 tablet (40 mg total) by mouth 2 (two) times daily. (Patient not taking: Reported on 01/20/2017) 60 tablet 2  . gemfibrozil (LOPID) 600 MG tablet Take 600 mg by mouth 2 (two) times daily before a meal.  Thirty minutes before morning and evening meal.    . guaiFENesin (MUCINEX) 600 MG 12 hr tablet Take 1 tablet (600 mg total) by mouth 2 (two) times daily. 40 tablet 0  . insulin glargine (LANTUS) 100 UNIT/ML injection Inject 0.6 mLs (60 Units total) into the skin daily. 10 mL 11  . Ipratropium-Albuterol (COMBIVENT RESPIMAT) 20-100 MCG/ACT AERS respimat Inhale 1 puff into the lungs every 6 (six) hours as needed for wheezing or shortness of breath. 1 Inhaler 3  . ipratropium-albuterol (DUONEB) 0.5-2.5 (3) MG/3ML SOLN Take 3 mLs by nebulization every 6 (six) hours as needed. (Patient taking differently: Take 3 mLs by nebulization every 6 (six) hours as needed (sob). ) 360 mL 12  . levofloxacin (LEVAQUIN) 750 MG tablet Take 1 tablet (750 mg total) by mouth daily. 4 tablet 0  . loratadine (CLARITIN) 10 MG tablet 1 daily for allergy while needed (Patient taking differently: Take 10 mg by mouth daily as needed for allergies. ) 30 tablet 11  . pantoprazole (PROTONIX) 40 MG tablet Take 1 tablet (40 mg total) by mouth daily at 12 noon. 40 tablet 1  . potassium chloride SA (K-DUR,KLOR-CON) 20 MEQ tablet Take 1 tablet (20 mEq total) by mouth 2 (two) times daily. 30 tablet 0  . triamcinolone cream (KENALOG) 0.1 % Apply 1 application topically daily.      Objective: BP 140/84   Pulse 89   Resp 18   SpO2 93%  Exam: General: morbidly obese male with BIPAP in place cooperative throughout exam but difficult to understand Eyes: heavy rheum crusted around upper and lower lids bilaterally, clear conjunctiva, EOM intact Neck: able to turn neck  Cardiovascular:  Limited due to body habitus, no peripheral pitting edema noted Respiratory: difficult to assess 2/2 body habitus, somewhat increased work of breathing Gastrointestinal: distended, no fluid shift, BS+ MSK: BLE appropriately warm and dry Derm: dry skin noted through extremities Neuro: no gross deficits noted Psych: tearful affect  Labs and Imaging: CBC  BMET  Recent Labs  Lab 06/09/17 1309  WBC 4.7  HGB 11.8*  HCT 44.7  PLT 208   Recent Labs  Lab 06/09/17 1309  NA 141  K 4.7  CL 87*  CO2 46*  BUN 21*  CREATININE 1.18  GLUCOSE 109*  CALCIUM 9.2     BNP 268.3 Troponin .08  Roche, Sallyanne Havers, Medical Student 06/09/2017, 5:15 PM MS4, [place] Intern pager: [phone], text pages welcome  I have personally seen and examined this patient with [facility] Roche and agree with the above note. The following is my additional documentation.  Patient endorsing shortness of breath improved on BiPAP.  He denies any history of chest pain.  He made it clear he does not want a tracheostomy but would like to be full code including chest compressions, shock, and intubation.  Physical Exam: General: lying in bed, NAD with non-toxic appearance HEENT: normocephalic, atraumatic, moist mucous membranes Neck: supple, no JVD Cardiovascular: regular rate and rhythm without murmurs, rubs, or gallops Lungs: limited exam due to body habitus with coarse breath sounds bilaterally with moderate increased work of breathing on BiPAP Abdomen: soft, non-tender, non-distended, normoactive bowel sounds Skin: warm, dry, no rashes or lesions, cap refill < 2 seconds Extremities: warm and well perfused, normal tone, 1+ edema bilaterally in LE Neuro: alert and oriented to me in the room, able to answer in broken sentences through BiPAP, moves all 4 extremities spontaneously  Assessment/Plan Timothy Le is a 57 y.o. male presenting with acute hypoxic hypercarbic respiratory failure in the setting of a COPD exacerbation and likely new onset heart failure based on fluid overload state.  PMH significant for COPD (chronically on 2 L [place] not adherent to CPAP), OSA, obesity hypoventilation syndrome, DM, HTN.  Acute hypoxic hypercarbic respiratory failure  COPD exacerbation  Fluid overload: Likely multifactorial due to nonadherence to Lasix and CPAP.   Concerning for new onset heart failure given slight bump in troponin at 0.08 on admission and signs of fluid overload state clinically.  Will need to evaluate with echocardiogram to ascertain.  Plan to obtain d-dimer to evaluate for possible PE though less likely.  We will continue with BiPAP in hopes to avoid intubation given improved acidotic state based on improved pH and PCO2 by serial ABGs.  Hypertension: Chronic.  Presented hypertensive requiring nitroglycerin drip.  BP now more appropriate at 140/80s.  Lack of adherence to Lasix likely contributing.  Diabetes: Chronic.  CBGs remain in the 100s.  On Lantus at home.  We will continue decrease insulin dose at 10 units daily with resistant sliding scale given NPO status.  Dispo: Admit for close monitoring on SDU and treat as COPD exacerbation and possible new onset HF.  Harriet [place], [place], PGY-2

## 2017-06-09 NOTE — ED Notes (Signed)
Vanita Panda, MD notified re: pt episodes of apnea and difficulty arousing, RT aware

## 2017-06-09 NOTE — Progress Notes (Addendum)
Pharmacy Antibiotic Note  Timothy Le is a 57 y.o. male admitted on 06/09/2017 with SOB Pharmacy has been consulted for Levaquin dosing for CAP. -WBC= 4.7, afebrile, CrCl ~ 100  Plan: -Levaquin 750mg  IV q24h -Will follow renal function, cultures and clinical progress   Height: 5\' 8"  (172.7 cm) Weight: (!) 372 lb 9.2 oz (169 kg) IBW/kg (Calculated) : 68.4  Temp (24hrs), Avg:98.4 F (36.9 C), Min:98.4 F (36.9 C), Max:98.4 F (36.9 C)  Recent Labs  Lab 06/09/17 1309  WBC 4.7  CREATININE 1.18    Estimated Creatinine Clearance: 108.7 mL/min (by C-G formula based on SCr of 1.18 mg/dL).    No Known Allergies  Antimicrobials this admission: 5/22 LQ>>  Dose adjustments this admission:   Microbiology results: none  Thank you for allowing pharmacy to be a part of this patient's care.  Andros, Channing 06/09/2017 9:17 PM

## 2017-06-09 NOTE — ED Notes (Signed)
MD Vanita Panda to bedside due to low respiratory effort. MD at bedside and placed pt on nasal Canula instead. Pt was able to wake up and have conversation with Vanita Panda. Pt tolerating [place] well. Will monitor pt.

## 2017-06-09 NOTE — Progress Notes (Signed)
CRITICAL ABG RESULTS REPORTED TO DR. Venora Maples.

## 2017-06-09 NOTE — Significant Event (Signed)
Rapid Response Event Note  Overview: Arrival Time: 2345 Event Type: Respiratory, Neurologic  Initial Focused Assessment: While on rounds, Ernie RRT asked me to see Timothy Le.  Timothy Le was admitted earlier today from the ED.Upon arrival, Timothy Le would not awake to voice and only to noxious stimuli.  Pt will wake briefly but not answer questions.  Pt is on bipap with only 225 cc TV and partially obstructing his airway.  When a chin lift is performed his TV increases to over 500cc.  BBS Diminshed throughout.  RR 24-28 with sats 98% on 80% FIO2.   Interventions: -ABG -Tx to ICU for intubation -Dr. Yisroel Ramming to bedside   Event Summary: ABG 7.16/pco2 undetectable/108/26.4  Madelynn Done

## 2017-06-09 NOTE — ED Provider Notes (Signed)
[facility] EMERGENCY DEPARTMENT Provider Note   CSN: [id number] Arrival date & time: 06/09/17  1301     History   Chief Complaint Chief Complaint  Patient presents with  . Shortness of Breath    HPI Timothy Le is a 57 y.o. male.  HPI  Presents with altered mental status and dyspnea. The patient himself awakens briefly, answers some questions appropriately, but with single word responses or nodding. Reported the patient was at a private residence, and a companion called EMS due to the patient not acting right, with difficulty breathing. EMS reports that patient was apneic on arrival, and with supplemental oxygen, CPAP had improvement, though did not ever achieve full normal interactivity. Reported the patient was also incontinent of urine and bowel movements on the scene. The patient himself does acknowledge history of respiratory disease, though cannot specify anything beyond that. Patient is on CPAP on arrival, level 5 caveat secondary to acuity of condition.  Past Medical History:  Diagnosis Date  . Asthma   . COPD (chronic obstructive pulmonary disease) ([place])   . Diabetes mellitus without complication ([place])   . Hypertension   . Shortness of breath dyspnea   . Sleep apnea     Patient Active Problem List   Diagnosis Date Noted  . COPD with acute exacerbation ([place]) 06/17/2016  . Seasonal and perennial allergic rhinitis 05/24/2016  . AKI (acute kidney injury) ([place]) 04/08/2015  . Hypovolemic shock ([place]) 04/08/2015  . DKA, type 2 ([place]) 04/08/2015  . Sepsis ([place]) 04/08/2015  . Hyponatremia 04/08/2015  . DM (diabetes mellitus) ([place]) 03/28/2015  . HTN (hypertension) 03/28/2015  . Near syncope 03/28/2015  . Type 2 diabetes mellitus with hyperosmolar nonketotic hyperglycemia ([place]) 03/28/2015  . Hyperglycemia 03/27/2015  . Acute on chronic respiratory failure with hypoxemia ([place]) 03/27/2015  . Acute on chronic congestive heart failure ([place])   .  COPD exacerbation ([place]) 02/07/2015  . Pressure ulcer 12/28/2014  . COPD mixed type ([place]) 12/27/2014  . Diabetes mellitus type 2, controlled ([place]) 12/27/2014  . Obesity, morbid ([place]) 07/29/2013  . Obstructive sleep apnea 07/29/2013  . Peripheral edema 07/29/2013    History reviewed. No pertinent surgical history.      Home Medications    Prior to Admission medications   Medication Sig Start Date End Date Taking? Authorizing Provider  beclomethasone (QVAR) 40 MCG/ACT inhaler Inhale 2 puffs into the lungs daily. 07/18/13   Piepenbrink, Anderson Malta, PA-C  blood glucose meter kit and supplies Dispense based on patient and insurance preference. Use up to four times daily as directed. (FOR ICD-9 250.00, 250.01). 03/29/15   [name], MD  cholecalciferol (VITAMIN D) 1000 units tablet Take 1,000 Units by mouth daily.    [provider]  fluticasone (FLONASE) 50 MCG/ACT nasal spray Place 2 sprays into both nostrils daily as needed for allergies.     [provider]  furosemide (LASIX) 40 MG tablet Take 1 tablet (40 mg total) by mouth 2 (two) times daily. 10/10/16   Virgel Manifold, MD  furosemide (LASIX) 40 MG tablet Take 1 tablet (40 mg total) by mouth 2 (two) times daily. Patient not taking: Reported on 01/20/2017 10/10/16   Virgel Manifold, MD  gemfibrozil (LOPID) 600 MG tablet Take 600 mg by mouth 2 (two) times daily before a meal. Thirty minutes before morning and evening meal.    [provider]  guaiFENesin (MUCINEX) 600 MG 12 hr tablet Take 1 tablet (600 mg total) by mouth 2 (two) times  daily. 06/21/16   Barton Dubois, MD  insulin glargine (LANTUS) 100 UNIT/ML injection Inject 0.6 mLs (60 Units total) into the skin daily. 04/12/15   Geradine Girt, DO  Ipratropium-Albuterol (COMBIVENT RESPIMAT) 20-100 MCG/ACT AERS respimat Inhale 1 puff into the lungs every 6 (six) hours as needed for wheezing or shortness of breath. 06/21/16   Barton Dubois, MD  ipratropium-albuterol  (DUONEB) 0.5-2.5 (3) MG/3ML SOLN Take 3 mLs by nebulization every 6 (six) hours as needed. Patient taking differently: Take 3 mLs by nebulization every 6 (six) hours as needed (sob).  07/29/16   Deneise Lever, MD  levofloxacin (LEVAQUIN) 750 MG tablet Take 1 tablet (750 mg total) by mouth daily. 06/22/16   Barton Dubois, MD  loratadine (CLARITIN) 10 MG tablet 1 daily for allergy while needed Patient taking differently: Take 10 mg by mouth daily as needed for allergies.  11/25/16   Baird Lyons D, MD  pantoprazole (PROTONIX) 40 MG tablet Take 1 tablet (40 mg total) by mouth daily at 12 noon. 06/22/16   Barton Dubois, MD  potassium chloride SA (K-DUR,KLOR-CON) 20 MEQ tablet Take 1 tablet (20 mEq total) by mouth 2 (two) times daily. 10/10/16   Virgel Manifold, MD  triamcinolone cream (KENALOG) 0.1 % Apply 1 application topically daily.    [provider]    Family History Family History  Problem Relation Age of Onset  . Asthma Sister   . Asthma Other        nephew  . Hypertension Sister   . Diabetes type II Sister     Social History Social History   Tobacco Use  . Smoking status: Former Smoker    Packs/day: 0.50    Years: 0.50    Pack years: 0.25    Types: Cigarettes    Start date: 07/28/1983    Last attempt to quit: 02/27/2013    Years since quitting: 4.2  . Smokeless tobacco: Never Used  Substance Use Topics  . Alcohol use: No    Comment: quit ETOH about 5 months ago-beer and liquor  . Drug use: No    Comment: quit 6 months-"pot"-smoked couple joints a week     Allergies   Patient has no known allergies.   Review of Systems Review of Systems  Unable to perform ROS: Acuity of condition     Physical Exam Updated Vital Signs Resp 15   SpO2 100%   Physical Exam  Constitutional: He appears well-developed. He appears ill. He appears distressed.  Morbidly obese male listless, receiving supplemental oxygen via CPAP  HENT:  Head: Normocephalic and atraumatic.    Eyes: Conjunctivae and EOM are normal.  Cardiovascular: Tachycardia present.  Pulmonary/Chest: Accessory muscle usage present. Tachypnea noted. He is in respiratory distress. He has decreased breath sounds. He has wheezes. He has rhonchi.  Abdominal:  Morbidly obese abdomen, nontender  Musculoskeletal: He exhibits no edema.  Neurological:  Listless  Skin: Skin is warm and dry.  Psychiatric: He has a normal mood and affect.  Nursing note and vitals reviewed.    ED Treatments / Results  Labs (all labs ordered are listed, but only abnormal results are displayed) Labs Reviewed  BLOOD GAS, ARTERIAL - Abnormal; Notable for the following components:      Result Value   pH, Arterial 7.149 (*)    pO2, Arterial 233.0 (*)    Bicarbonate 50.6 (*)    Acid-Base Excess 21.4 (*)    All other components within normal limits  CBC WITH DIFFERENTIAL/PLATELET  COMPREHENSIVE METABOLIC PANEL  BRAIN NATRIURETIC PEPTIDE  I-STAT VENOUS BLOOD GAS, ED  I-STAT TROPONIN, ED    EKG None  Radiology No results found.  Procedures Procedures (including critical care time)  Medications Ordered in ED Medications - No data to display   Initial Impression / Assessment and Plan / ED Course  I have reviewed the triage vital signs and the nursing notes.  Pertinent labs & imaging results that were available during my care of the patient were reviewed by me and considered in my medical decision making (see chart for details).   Initial evaluation patient was transferred to BiPAP. IV access was obtained. Initial x-ray notable for substantial opacification concerning for pulmonary edema.  Update:, After a period of time on BiPAP the patient is much more awake and alert, but continues to have tachypnea, decreased respiratory drive, but is now oriented appropriately. Patient will continue BiPAP, have supplemental oxygen, steroids, Lasix, nitroglycerin.   Update:, Patient now awake and alert, though with  continued increase of BRCA breathing. He asks to use his cell phone. He has urinated twice.  Update:, Patient now slightly worse, after I initially spoke with the admitting team for his bed placement upstairs. Patient does awaken, though only briefly with stimuli, he remains on BiPAP Patient has prior notation of being a difficult intubation, and this is consistent with patient's physical exam with substantial [place], morbid obesity, and given his slow improvement, additional time on BiPAP, with ongoing nitroglycerin drip, and having already received Lasix is reasonable. Patient's case discussed with Dr. Venora Maples, who will assist the admitting team for evaluation.  Final Clinical Impressions(s) / ED Diagnoses  Respiratory distress  CRITICAL CARE Performed by: Carmin Muskrat Total critical care time: 35 minutes Critical care time was exclusive of separately billable procedures and treating other patients. Critical care was necessary to treat or prevent imminent or life-threatening deterioration. Critical care was time spent personally by me on the following activities: development of treatment plan with patient and/or surrogate as well as nursing, discussions with consultants, evaluation of patient's response to treatment, examination of patient, obtaining history from patient or surrogate, ordering and performing treatments and interventions, ordering and review of laboratory studies, ordering and review of radiographic studies, pulse oximetry and re-evaluation of patient's condition.   Carmin Muskrat, MD 06/09/17 [phone]

## 2017-06-09 NOTE — ED Notes (Signed)
Admitting MD is at bedside.

## 2017-06-10 ENCOUNTER — Inpatient Hospital Stay (HOSPITAL_COMMUNITY): Payer: Medicaid Other

## 2017-06-10 ENCOUNTER — Other Ambulatory Visit: Payer: Self-pay

## 2017-06-10 ENCOUNTER — Encounter (HOSPITAL_COMMUNITY): Payer: Self-pay

## 2017-06-10 DIAGNOSIS — T884XXA Failed or difficult intubation, initial encounter: Secondary | ICD-10-CM

## 2017-06-10 DIAGNOSIS — J9621 Acute and chronic respiratory failure with hypoxia: Secondary | ICD-10-CM

## 2017-06-10 DIAGNOSIS — J9622 Acute and chronic respiratory failure with hypercapnia: Secondary | ICD-10-CM

## 2017-06-10 DIAGNOSIS — J9601 Acute respiratory failure with hypoxia: Secondary | ICD-10-CM

## 2017-06-10 LAB — GLUCOSE, CAPILLARY
GLUCOSE-CAPILLARY: 98 mg/dL (ref 65–99)
Glucose-Capillary: 107 mg/dL — ABNORMAL HIGH (ref 65–99)
Glucose-Capillary: 124 mg/dL — ABNORMAL HIGH (ref 65–99)
Glucose-Capillary: 125 mg/dL — ABNORMAL HIGH (ref 65–99)
Glucose-Capillary: 133 mg/dL — ABNORMAL HIGH (ref 65–99)
Glucose-Capillary: 71 mg/dL (ref 65–99)
Glucose-Capillary: 98 mg/dL (ref 65–99)

## 2017-06-10 LAB — COMPREHENSIVE METABOLIC PANEL
ALT: 13 U/L — ABNORMAL LOW (ref 17–63)
ANION GAP: 16 — AB (ref 5–15)
AST: 26 U/L (ref 15–41)
Albumin: 3 g/dL — ABNORMAL LOW (ref 3.5–5.0)
Alkaline Phosphatase: 62 U/L (ref 38–126)
BUN: 21 mg/dL — AB (ref 6–20)
CALCIUM: 9.3 mg/dL (ref 8.9–10.3)
CHLORIDE: 81 mmol/L — AB (ref 101–111)
CO2: 47 mmol/L — ABNORMAL HIGH (ref 22–32)
CREATININE: 1.27 mg/dL — AB (ref 0.61–1.24)
GFR calc non Af Amer: >60 mL/min (ref >60)
Glucose, Bld: 119 mg/dL — ABNORMAL HIGH (ref 65–99)
POTASSIUM: 5.3 mmol/L — AB (ref 3.5–5.1)
Sodium: 144 mmol/L (ref 135–145)
Total Bilirubin: 1.1 mg/dL (ref 0.3–1.2)
Total Protein: 7.8 g/dL (ref 6.5–8.1)

## 2017-06-10 LAB — BLOOD GAS, ARTERIAL
ACID-BASE EXCESS: 26.4 mmol/L — AB (ref 0.0–2.0)
Acid-Base Excess: 26.6 mmol/L — ABNORMAL HIGH (ref 0.0–2.0)
Bicarbonate: 52.1 mmol/L — ABNORMAL HIGH (ref 20.0–28.0)
Bicarbonate: 55.5 mmol/L — ABNORMAL HIGH (ref 20.0–28.0)
DELIVERY SYSTEMS: POSITIVE
DRAWN BY: 252031
Drawn by: 51831
Expiratory PAP: 5
FIO2: 100
FIO2: 80
Inspiratory PAP: 23
O2 SAT: 97.2 %
O2 Saturation: 96.8 %
PATIENT TEMPERATURE: 98.6
PCO2 ART: 53.8 mmHg — AB (ref 32.0–48.0)
PEEP: 10 cmH2O
PH ART: 7.166 — AB (ref 7.350–7.450)
PO2 ART: 70.4 mmHg — AB (ref 83.0–108.0)
Patient temperature: 98.6
RATE: 20 resp/min
VT: 510 mL
pH, Arterial: 7.592 — ABNORMAL HIGH (ref 7.350–7.450)
pO2, Arterial: 108 mmHg (ref 83.0–108.0)

## 2017-06-10 LAB — POCT I-STAT 3, ART BLOOD GAS (G3+)
ACID-BASE EXCESS: 26 mmol/L — AB (ref 0.0–2.0)
BICARBONATE: 55.2 mmol/L — AB (ref 20.0–28.0)
O2 Saturation: 83 %
PO2 ART: 48 mmHg — AB (ref 83.0–108.0)
TCO2: >50 mmol/L — ABNORMAL HIGH (ref 22–32)
pCO2 arterial: 77.8 mmHg (ref 32.0–48.0)
pH, Arterial: 7.458 — ABNORMAL HIGH (ref 7.350–7.450)

## 2017-06-10 LAB — CBC
HCT: 42.5 % (ref 39.0–52.0)
Hemoglobin: 11.3 g/dL — ABNORMAL LOW (ref 13.0–17.0)
MCH: 25.6 pg — ABNORMAL LOW (ref 26.0–34.0)
MCHC: 26.6 g/dL — ABNORMAL LOW (ref 30.0–36.0)
MCV: 96.4 fL (ref 78.0–100.0)
PLATELETS: 212 10*3/uL (ref 150–400)
RBC: 4.41 MIL/uL (ref 4.22–5.81)
RDW: 17.2 % — AB (ref 11.5–15.5)
WBC: 8.3 10*3/uL (ref 4.0–10.5)

## 2017-06-10 LAB — HIV ANTIBODY (ROUTINE TESTING W REFLEX): HIV Screen 4th Generation wRfx: NONREACTIVE

## 2017-06-10 LAB — ECHOCARDIOGRAM COMPLETE
Height: 66 in
WEIGHTICAEL: 5918.91 [oz_av]

## 2017-06-10 LAB — MRSA PCR SCREENING: MRSA BY PCR: POSITIVE — AB

## 2017-06-10 LAB — TROPONIN I
TROPONIN I: 0.05 ng/mL — AB (ref <0.03)
TROPONIN I: 0.05 ng/mL — AB (ref <0.03)

## 2017-06-10 LAB — PROCALCITONIN: PROCALCITONIN: 0.23 ng/mL

## 2017-06-10 LAB — TRIGLYCERIDES: TRIGLYCERIDES: 78 mg/dL (ref <150)

## 2017-06-10 MED ORDER — PROPOFOL 1000 MG/100ML IV EMUL
INTRAVENOUS | Status: AC
  Administered 2017-06-10: 10 ug/kg/min via INTRAVENOUS
  Filled 2017-06-10: qty 100

## 2017-06-10 MED ORDER — METHYLPREDNISOLONE SODIUM SUCC 40 MG IJ SOLR
40.0000 mg | Freq: Every day | INTRAMUSCULAR | Status: DC
  Administered 2017-06-10 – 2017-06-11 (×2): 40 mg via INTRAVENOUS
  Filled 2017-06-10 (×2): qty 1

## 2017-06-10 MED ORDER — FENTANYL CITRATE (PF) 100 MCG/2ML IJ SOLN
INTRAMUSCULAR | Status: AC
  Administered 2017-06-10: 100 ug via INTRAVENOUS
  Filled 2017-06-10: qty 2

## 2017-06-10 MED ORDER — INSULIN ASPART 100 UNIT/ML ~~LOC~~ SOLN
3.0000 [IU] | SUBCUTANEOUS | Status: DC
  Administered 2017-06-10 – 2017-06-19 (×19): 3 [IU] via SUBCUTANEOUS
  Administered 2017-06-19: 6 [IU] via SUBCUTANEOUS
  Administered 2017-06-19 – 2017-06-28 (×44): 3 [IU] via SUBCUTANEOUS
  Administered 2017-06-29: 6 [IU] via SUBCUTANEOUS
  Administered 2017-06-29: 3 [IU] via SUBCUTANEOUS
  Administered 2017-06-29: 6 [IU] via SUBCUTANEOUS
  Administered 2017-06-29 – 2017-06-30 (×4): 3 [IU] via SUBCUTANEOUS
  Administered 2017-06-30: 6 [IU] via SUBCUTANEOUS
  Administered 2017-07-01 – 2017-07-08 (×31): 3 [IU] via SUBCUTANEOUS

## 2017-06-10 MED ORDER — ETOMIDATE 2 MG/ML IV SOLN
20.0000 mg | Freq: Once | INTRAVENOUS | Status: AC
  Administered 2017-06-10: 20 mg via INTRAVENOUS

## 2017-06-10 MED ORDER — ORAL CARE MOUTH RINSE
15.0000 mL | OROMUCOSAL | Status: DC
  Administered 2017-06-10 – 2017-06-22 (×119): 15 mL via OROMUCOSAL

## 2017-06-10 MED ORDER — CHLORHEXIDINE GLUCONATE 0.12% ORAL RINSE (MEDLINE KIT)
15.0000 mL | Freq: Two times a day (BID) | OROMUCOSAL | Status: DC
  Administered 2017-06-10 – 2017-07-02 (×45): 15 mL via OROMUCOSAL

## 2017-06-10 MED ORDER — LEVOFLOXACIN IN D5W 750 MG/150ML IV SOLN
750.0000 mg | INTRAVENOUS | Status: DC
  Administered 2017-06-10: 750 mg via INTRAVENOUS
  Filled 2017-06-10: qty 150

## 2017-06-10 MED ORDER — BISACODYL 10 MG RE SUPP
10.0000 mg | Freq: Every day | RECTAL | Status: DC | PRN
  Administered 2017-06-15: 10 mg via RECTAL
  Filled 2017-06-10: qty 1

## 2017-06-10 MED ORDER — PROPOFOL 1000 MG/100ML IV EMUL
5.0000 ug/kg/min | INTRAVENOUS | Status: DC
  Administered 2017-06-10: 12 ug/kg/min via INTRAVENOUS
  Administered 2017-06-10 (×2): 10 ug/kg/min via INTRAVENOUS
  Administered 2017-06-11 (×2): 12 ug/kg/min via INTRAVENOUS
  Filled 2017-06-10 (×4): qty 100

## 2017-06-10 MED ORDER — FENTANYL BOLUS VIA INFUSION
50.0000 ug | INTRAVENOUS | Status: DC | PRN
  Administered 2017-06-10 (×2): 50 ug via INTRAVENOUS
  Filled 2017-06-10: qty 50

## 2017-06-10 MED ORDER — ACETAZOLAMIDE ORAL SUSPENSION 25 MG/ML
500.0000 mg | Freq: Two times a day (BID) | ORAL | Status: DC
  Filled 2017-06-10: qty 20

## 2017-06-10 MED ORDER — MIDAZOLAM HCL 2 MG/2ML IJ SOLN: 2.0000 mg | INTRAMUSCULAR | Status: DC | PRN

## 2017-06-10 MED ORDER — MIDAZOLAM HCL 2 MG/2ML IJ SOLN
INTRAMUSCULAR | Status: AC
  Administered 2017-06-10: 4 mg
  Filled 2017-06-10: qty 4

## 2017-06-10 MED ORDER — ROCURONIUM BROMIDE 50 MG/5ML IV SOLN
100.0000 mg | Freq: Once | INTRAVENOUS | Status: AC
  Administered 2017-06-10: 100 mg via INTRAVENOUS

## 2017-06-10 MED ORDER — FENTANYL CITRATE (PF) 100 MCG/2ML IJ SOLN
50.0000 ug | Freq: Once | INTRAMUSCULAR | Status: AC
  Administered 2017-06-10: 100 ug via INTRAVENOUS

## 2017-06-10 MED ORDER — FENTANYL 2500MCG IN NS 250ML (10MCG/ML) PREMIX INFUSION
25.0000 ug/h | INTRAVENOUS | Status: DC
  Administered 2017-06-10 (×2): 400 ug/h via INTRAVENOUS
  Administered 2017-06-10: 100 ug/h via INTRAVENOUS
  Administered 2017-06-10 – 2017-06-11 (×3): 400 ug/h via INTRAVENOUS
  Filled 2017-06-10 (×7): qty 250

## 2017-06-10 MED ORDER — ORAL CARE MOUTH RINSE: 15.0000 mL | Freq: Four times a day (QID) | OROMUCOSAL | Status: DC

## 2017-06-10 MED ORDER — ACETAZOLAMIDE 250 MG PO TABS
500.0000 mg | ORAL_TABLET | Freq: Two times a day (BID) | ORAL | Status: AC
  Administered 2017-06-10 – 2017-06-12 (×6): 500 mg
  Filled 2017-06-10 (×6): qty 2

## 2017-06-10 MED ORDER — MUPIROCIN 2 % EX OINT
1.0000 "application " | TOPICAL_OINTMENT | Freq: Two times a day (BID) | CUTANEOUS | Status: AC
  Administered 2017-06-10 – 2017-06-14 (×10): 1 via NASAL
  Filled 2017-06-10 (×3): qty 22

## 2017-06-10 MED ORDER — DOCUSATE SODIUM 50 MG/5ML PO LIQD
100.0000 mg | Freq: Two times a day (BID) | ORAL | Status: DC | PRN
Start: 2017-06-10 — End: 2017-06-15
  Administered 2017-06-10 – 2017-06-13 (×2): 100 mg
  Filled 2017-06-10 (×2): qty 10

## 2017-06-10 MED ORDER — CHLORHEXIDINE GLUCONATE CLOTH 2 % EX PADS
6.0000 | MEDICATED_PAD | Freq: Every day | CUTANEOUS | Status: AC
  Administered 2017-06-10 – 2017-06-15 (×4): 6 via TOPICAL

## 2017-06-10 MED ORDER — MIDAZOLAM HCL 2 MG/2ML IJ SOLN
INTRAMUSCULAR | Status: AC
  Administered 2017-06-10: 2 mg
  Filled 2017-06-10: qty 2

## 2017-06-10 MED ORDER — CHLORHEXIDINE GLUCONATE 0.12% ORAL RINSE (MEDLINE KIT): 15.0000 mL | Freq: Two times a day (BID) | OROMUCOSAL | Status: DC

## 2017-06-10 MED ORDER — FAMOTIDINE IN NACL 20-0.9 MG/50ML-% IV SOLN
20.0000 mg | Freq: Two times a day (BID) | INTRAVENOUS | Status: DC
  Administered 2017-06-10 – 2017-06-21 (×22): 20 mg via INTRAVENOUS
  Filled 2017-06-10 (×26): qty 50

## 2017-06-10 NOTE — Progress Notes (Signed)
Per discussion with Daytime RN- patient has no teeth in his mouth and his sister says that he has dentures at home but never wears them. Also patient's sister brought his cell phone home, since he is intubated and cannot use it at this time.   Milford Cage, RN

## 2017-06-10 NOTE — Progress Notes (Signed)
*  PRELIMINARY RESULTS* Echocardiogram 2D Echocardiogram has been performed.  Timothy Le 06/10/2017, 9:21 AM

## 2017-06-10 NOTE — Procedures (Signed)
Endotracheal Intubation Procedure Note  Indication for endotracheal intubation: airway compromise and respiratory failure. Airway Assessment: Mallampati Class: IV (only hard palate visible). Sedation: etomidate, fentanyl and midazolam. Paralytic: rocuronium. Lidocaine: no. Atropine: no. Equipment: Macintosh 4 laryngoscope blade and 6.4mm cuffed endotracheal tube. Cricoid Pressure: no. Number of attempts: 3. ETT location confirmed by by auscultation, by CXR and ETCO2 monitor.  Timothy Le Karie Skowron 06/10/2017

## 2017-06-10 NOTE — Progress Notes (Signed)
I appreciate CCM taking over patient's care. D-Dimer is elevated with history of non-ambulatory for many months. Please consider obtaining CTA chest to R/O PE when stable for it vs initiating treatment. Thanks.

## 2017-06-10 NOTE — Consult Note (Signed)
PULMONARY / CRITICAL CARE MEDICINE   Name: Timothy Le MRN: [id number] DOB: 1960-10-11    ADMISSION DATE:  06/09/2017 CONSULTATION DATE:  06/10/2017  REFERRING MD:  Graciella Freer  CHIEF COMPLAINT:  Hypercarbic respiratory failure  HISTORY OF PRESENT ILLNESS:   HPI obtained from medical chart review as patient is encephalopathic and unable to provide.   57 year old male with PMH significant for COPD on baseline 2L, OSA/ OHS-w/poor compliance w/CPAP, morbid obesity, asthma, former tobacco abuse, HTN and DMT2 presenting with several day history of worsening SOB.  He was admitted to IMTS stepdown on BiPAP and initially had improving ABGs however repeat ABG showed acidotic ph and unreadable CO2 levels. On evaluation pt obtunded and very difficult to arouse with a lot of copious secretions. Decision made to intubate.  PAST MEDICAL HISTORY :  He  has a past medical history of Asthma, COPD (chronic obstructive pulmonary disease) ([place]), Diabetes mellitus without complication ([place]), Hypertension, Shortness of breath dyspnea, and Sleep apnea.  PAST SURGICAL HISTORY: He  has no past surgical history on file.  No Known Allergies  No current facility-administered medications on file prior to encounter.    Current Outpatient Medications on File Prior to Encounter  Medication Sig  . amLODipine (NORVASC) 10 MG tablet Take 10 mg by mouth daily.  Marland Kitchen atorvastatin (LIPITOR) 10 MG tablet Take 10 mg by mouth daily.  . furosemide (LASIX) 40 MG tablet Take 1 tablet (40 mg total) by mouth 2 (two) times daily.  Marland Kitchen gemfibrozil (LOPID) 600 MG tablet Take 600 mg by mouth 2 (two) times daily before a meal. Thirty minutes before morning and evening meal.  . guaiFENesin (MUCINEX) 600 MG 12 hr tablet Take 1 tablet (600 mg total) by mouth 2 (two) times daily.  . insulin glargine (LANTUS) 100 UNIT/ML injection Inject 0.6 mLs (60 Units total) into the skin daily.  Marland Kitchen lisinopril-hydrochlorothiazide (PRINZIDE,ZESTORETIC) 20-25  MG tablet Take 1 tablet by mouth daily.  Marland Kitchen loratadine (CLARITIN) 10 MG tablet 1 daily for allergy while needed (Patient taking differently: Take 10 mg by mouth daily as needed for allergies. )  . metFORMIN (GLUCOPHAGE) 500 MG tablet Take 1,000 mg by mouth 2 (two) times daily with a meal.  . blood glucose meter kit and supplies Dispense based on patient and insurance preference. Use up to four times daily as directed. (FOR ICD-9 250.00, 250.01).    FAMILY HISTORY:  His indicated that his mother is deceased. He indicated that his father is alive. He indicated that five of his nine sisters are alive. He indicated that his brother is alive. He indicated that the status of his other is unknown.   SOCIAL HISTORY: He  reports that he quit smoking about 4 years ago. His smoking use included cigarettes. He started smoking about 33 years ago. He has a 0.25 pack-year smoking history. He has never used smokeless tobacco. He reports that he does not drink alcohol or use drugs.  REVIEW OF SYSTEMS:   Unable to obtain  SUBJECTIVE:   VITAL SIGNS: BP 115/67 (BP Location: Right Wrist)   Pulse 88   Temp 98.5 F (36.9 C) (Axillary)   Resp (!) 34   Ht _0  (1.727 m)   Wt (!) 169 kg (372 lb 9.2 oz)   SpO2 93%   BMI 56.65 kg/m   HEMODYNAMICS:    VENTILATOR SETTINGS: FiO2 (%):  [30 %-80 %] 80 %  INTAKE / OUTPUT: I/O last 3 completed shifts: In: -  Out:  1925 [Urine:1925]  PHYSICAL EXAMINATION: General:  Morbidly obese adult male somnolent on BiPAP HEENT: MM pink/moist, pupils 3/equal /reactive, anicteric, no neck -unable to assess JVD Neuro:  Somnolent, intermittently follows commands with heavy stimulus CV: RRR, distant heart sounds PULM: non-labored on bipap, poor seal, diminished bs throughout GU:YQIHK/ obese, +bs, no obvious tenderness Extremities: warm/dry, trace BLE edema  Skin: no rashes, dry skin   LABS:  BMET Recent Labs  Lab 06/09/17 1309  NA 141  K 4.7  CL 87*  CO2 46*   BUN 21*  CREATININE 1.18  GLUCOSE 109*    Electrolytes Recent Labs  Lab 06/09/17 1309 06/09/17 2112  CALCIUM 9.2  --   MG  --  1.9    CBC Recent Labs  Lab 06/09/17 1309  WBC 4.7  HGB 11.8*  HCT 44.7  PLT 208    Coag's No results for input(s): APTT, INR in the last 168 hours.  Sepsis Markers No results for input(s): LATICACIDVEN, PROCALCITON, O2SATVEN in the last 168 hours.  ABG Recent Labs  Lab 06/09/17 1535 06/09/17 1811 06/09/17 2355  PHART 7.220* 7.312* 7.166*  PCO2ART ABOVE REPORATBLE RANGE 107* CRITICAL RESULT CALLED TO, READ BACK BY AND VERIFIED WITH:  PO2ART 58.2* 60.4* 108    Liver Enzymes Recent Labs  Lab 06/09/17 1309  AST 20  ALT 11*  ALKPHOS 69  BILITOT 0.6  ALBUMIN 3.1*    Cardiac Enzymes Recent Labs  Lab 06/09/17 2112  TROPONINI 0.06*    Glucose Recent Labs  Lab 06/09/17 2001 06/09/17 2346  GLUCAP 103* 112*    Imaging Dg Chest Portable 1 View  Result Date: 06/09/2017 CLINICAL DATA:  Shortness of breath, COPD, asthma, diabetes mellitus, hypertension, sleep apnea, former smoker EXAM: PORTABLE CHEST 1 VIEW COMPARISON:  Portable exam 1315 hours compared to 01/20/2017 FINDINGS: Enlargement of cardiac silhouette. Prominent mediastinum light which may related to technique. Pulmonary vascularity normal. Low lung volumes with bibasilar atelectasis. Upper lungs clear. No pleural effusion or pneumothorax. IMPRESSION: Enlargement of cardiac silhouette. Low lung volumes with bibasilar atelectasis. Electronically Signed   By: Lavonia Dana M.D.   On: 06/09/2017 13:24   STUDIES:  PORTABLE CHEST 1 VIEW  COMPARISON:  06/09/2017, 01/20/2017  FINDINGS: Endotracheal tube tip is about 3.4 cm superior to the carina. Esophageal tube tip is below the diaphragm but not included. Cardiomegaly with vascular congestion and mild perihilar edema. No pleural effusion. No pneumothorax.  IMPRESSION: 1. Endotracheal tube tip about 3.4 cm superior to  the carina 2. 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 Now intubated and admitted to ICU  ASSESSMENT / PLAN:  PULMONARY A: Acute Hypercapnic Respiratory Failure secondary to OSA and OHS w/ poor CPAP compliance.  NPPE- upper airway obstruction Difficult airway P:   Intubated on mechanical ventilation Size 6.5 ETT in place, copious secretions Cords swollen and poor Mallampati Titrate FiO2 to keep Sat >92% TV 8cc/kg for adequate ventilation Acceptable ph >7.3 Sedation RASS goal -1 started on fentanyl ggt May require additional sedation with propofol  CARDIOVASCULAR A:  Diastolic Dysfunction HTN Cardiogenic Pulmonary edema  vs Negative Pressure Pulmonary edema P:  On evaluation was on Nitro ggt Discontinued prior to intubation Last EF 65-70% with concentric LVH TTE have limited views due to body habitus Goal SBP 140 or less BNP 268 most likely underestimated due to elevated BMI Diurese with Lasix- indwelling foley cath monitor UOP Trend Trops last value 0.06  RENAL A:   No acute issues P:   GFR >60 Replace electrolytes as needed Placing indwelling foley catheter Continuing diuresis  GASTROINTESTINAL A:   Feeding and Prophylaxis P:   OGT placed post intubation NPO for now  if pt remains intubated Nutrition should assess TF requirements Started on GI Prophylaxis- pepcid  HEMATOLOGIC A:   VTE prophylaxis P:  Lovenox daily SCDs  INFECTIOUS A:   No evidence of an acute infection P:   Send PCT F/u cx Was  on leavquin prior to our eval CXR shows pulm edema with vasc congestion no lobar infiltrate No leukocytosis no fevers No indication for abx at this time  ENDOCRINE A:   HgbA1c 5.4   P:   BG not requiring ISS Continues NPO If BG >158m/dl will start coverage  NEUROLOGIC A:   Acute encephalopathy secondary to CO2 Narcosis P:   CO2 was undetectable Intubated on mech ventilation as CO2 improves pt should wake up RASS goal: -1 to 2 Started on analgesia w/ fentanyl May require propofol ggt Daily sedation vacation   FAMILY  - Updates: none at bedside at time of eval - Inter-disciplinary family meet or Palliative Care meeting due by: day 7  Signed Dr KSeward CarolPulmonary Critical Care Locums  HealthCare Pager: [phone] 06/10/2017, 12:21 AM

## 2017-06-10 NOTE — Progress Notes (Signed)
Arterial gas results given to Dr.Scatliffe.

## 2017-06-10 NOTE — Progress Notes (Signed)
FPTS Interim Progress Note  Called the floor regarding rapid response due to patient being unresponsive while on BiPAP.  Upon entering room, patient was resting with BiPAP.  Patient did respond by looking up on sternal rub but remain somnolent.  Vital signs revealed maintain oxygenation in the high 90s on BiPAP with respirations in the mid 30s, heart rate in the 80s, and normotensive BP at 110s/70s.  ABG obtained showing patient has worsening acidosis with pH 7.16 and unmeasurable level of pCO2.  Occasions reviewed without signs of sedating medications.  Discussed with PCCM who will come and evaluate patient.  He was confirmed as a full code by myself while in the emergency room earlier this evening.  We will continue monitoring him closely.  Timothy Le, [place], DO 06/10/2017, 12:12 AM PGY-2, [facility] Service pager: [phone]

## 2017-06-10 NOTE — Significant Event (Signed)
Rapid Response Event Note  Medicines administered for Intubation: Verbal Orders received from Seward Carol MD  Administered at 0030 Etomidate 20 mg Midazolam 4mg  Fentanyl 50 mcg  Administered at 0100 after intubation Rocuronium 100mg  Midazolam 2mg  Fentanyl 50 mcg  Madelynn Done

## 2017-06-10 NOTE — Progress Notes (Signed)
Follow up - Critical Care Medicine Note  Patient Details:    Timothy Le is an 57 y.o. male.  Lines, Airways, Drains: Airway 6.5 mm (Active)  Secured at (cm) 27 cm 06/10/2017 11:17 AM  Measured From Lips 06/10/2017 11:17 AM  Secured Location Left 06/10/2017 11:17 AM  Secured By [employer] 06/10/2017 11:17 AM  Tube Holder Repositioned Yes 06/10/2017 11:17 AM  Cuff Pressure (cm H2O) 40 cm H2O 06/10/2017  8:32 AM  Site Condition Dry 06/10/2017 11:17 AM     NG/OG Tube [facility] mouth Xray Measured external length of tube (Active)  Ongoing Placement Verification No change in cm markings or external length of tube from initial placement 06/10/2017  8:00 AM  Status Clamped 06/10/2017  8:00 AM     Urethral Catheter Forestine Na, RN Latex;Double-lumen;Straight-tip 16 Fr. (Active)  Indication for Insertion or Continuance of Catheter Unstable critical patients (first 24-48 hours);Aggressive IV diuresis 06/10/2017  8:00 AM  Site Assessment Clean;Intact 06/10/2017  8:00 AM  Catheter Maintenance Bag below level of bladder;Catheter secured;Insertion date on drainage bag;Drainage bag/tubing not touching floor;No dependent loops;Seal intact 06/10/2017  8:00 AM  Collection Container Standard drainage bag 06/10/2017  8:00 AM  Securement Method Leg strap 06/10/2017  8:00 AM  Urinary Catheter Interventions Unclamped 06/10/2017  8:00 AM  Output (mL) 1100 mL 06/10/2017 10:00 AM    Anti-infectives:  Anti-infectives (From admission, onward)   Start     Dose/Rate Route Frequency Ordered Stop   06/10/17 2200  levofloxacin (LEVAQUIN) IVPB 750 mg     750 mg 100 mL/hr over 90 Minutes Intravenous Every 24 hours 06/10/17 1031     06/09/17 2230  levofloxacin (LEVAQUIN) IVPB 750 mg  Status:  Discontinued     750 mg 100 mL/hr over 90 Minutes Intravenous Every 24 hours 06/09/17 2120 06/10/17 0104      Microbiology: Results for orders placed or performed during the hospital encounter of 06/09/17  MRSA  PCR Screening     Status: Abnormal   Collection Time: 06/10/17  2:01 AM  Result Value Ref Range Status   MRSA by PCR POSITIVE (A) NEGATIVE Final    Comment:        The GeneXpert MRSA Assay (FDA approved for NASAL specimens only), is one component of a comprehensive MRSA colonization surveillance program. It is not intended to diagnose MRSA infection nor to guide or monitor treatment for MRSA infections. RESULT CALLED TO, READ BACK BY AND VERIFIED WITH: T HOOD,RN AT 0850 06/10/17 BY L BENFIELD Performed at [facility], [place] [address] [place], [place] [address]    Laboratory: Results for orders placed or performed during the hospital encounter of 06/09/17 (from the past 24 hour(s))  CBC with Differential     Status: Abnormal   Collection Time: 06/09/17  1:09 PM  Result Value Ref Range   WBC 4.7 4.0 - 10.5 K/uL   RBC 4.46 4.22 - 5.81 MIL/uL   Hemoglobin 11.8 (L) 13.0 - 17.0 g/dL   HCT 44.7 39.0 - 52.0 %   MCV 100.2 (H) 78.0 - 100.0 fL   MCH 26.5 26.0 - 34.0 pg   MCHC 26.4 (L) 30.0 - 36.0 g/dL   RDW 17.6 (H) 11.5 - 15.5 %   Platelets 208 150 - 400 K/uL   Neutrophils Relative % 73 %   Neutro Abs 3.4 1.7 - 7.7 K/uL   Lymphocytes Relative 16 %   Lymphs Abs 0.8 0.7 - 4.0 K/uL   Monocytes  Relative 9 %   Monocytes Absolute 0.4 0.1 - 1.0 K/uL   Eosinophils Relative 1 %   Eosinophils Absolute 0.0 0.0 - 0.7 K/uL   Basophils Relative 0 %   Basophils Absolute 0.0 0.0 - 0.1 K/uL   Immature Granulocytes 1 %   Abs Immature Granulocytes 0.1 0.0 - 0.1 K/uL  Comprehensive metabolic panel     Status: Abnormal   Collection Time: 06/09/17  1:09 PM  Result Value Ref Range   Sodium 141 135 - 145 mmol/L   Potassium 4.7 3.5 - 5.1 mmol/L   Chloride 87 (L) 101 - 111 mmol/L   CO2 46 (H) 22 - 32 mmol/L   Glucose, Bld 109 (H) 65 - 99 mg/dL   BUN 21 (H) 6 - 20 mg/dL   Creatinine, Ser 1.18 0.61 - 1.24 mg/dL   Calcium 9.2 8.9 - 10.3 mg/dL   Total Protein 8.2 (H) 6.5 - 8.1 g/dL   Albumin  3.1 (L) 3.5 - 5.0 g/dL   AST 20 15 - 41 U/L   ALT 11 (L) 17 - 63 U/L   Alkaline Phosphatase 69 38 - 126 U/L   Total Bilirubin 0.6 0.3 - 1.2 mg/dL   GFR calc non Af Amer >60 >60 mL/min   GFR calc Af Amer >60 >60 mL/min   Anion gap 8 5 - 15  Brain natriuretic peptide     Status: Abnormal   Collection Time: 06/09/17  1:09 PM  Result Value Ref Range   B Natriuretic Peptide 268.3 (H) 0.0 - 100.0 pg/mL  I-Stat Troponin, ED (not at [facility])     Status: None   Collection Time: 06/09/17  1:35 PM  Result Value Ref Range   Troponin i, poc 0.08 0.00 - 0.08 ng/mL   Comment NOTIFIED PHYSICIAN    Comment 3          Blood gas, arterial     Status: Abnormal   Collection Time: 06/09/17  1:42 PM  Result Value Ref Range   FIO2 100.00    Delivery systems BILEVEL POSITIVE AIRWAY PRESSURE    Inspiratory PAP 12.0    Expiratory PAP 5.0    pH, Arterial 7.149 (LL) 7.350 - 7.450   pCO2 arterial  32.0 - 48.0 mmHg    CRITICAL RESULT CALLED TO, READ BACK BY AND VERIFIED WITH:   pO2, Arterial 233.0 (H) 83.0 - 108.0 mmHg   Bicarbonate 50.6 (H) 20.0 - 28.0 mmol/L   Acid-Base Excess 21.4 (H) 0.0 - 2.0 mmol/L   O2 Saturation 99.2 %   Patient temperature 98.6    Collection site RIGHT RADIAL    Drawn by [id number]    Sample type ARTERIAL DRAW    Allens test (pass/fail) PASS PASS  D-dimer, quantitative (not at [facility])     Status: Abnormal   Collection Time: 06/09/17  1:56 PM  Result Value Ref Range   D-Dimer, Quant 2.06 (H) 0.00 - 0.50 ug/mL-FEU  Blood gas, arterial     Status: Abnormal   Collection Time: 06/09/17  3:35 PM  Result Value Ref Range   FIO2 35.00    Mode BILEVEL POSITIVE AIRWAY PRESSURE    LHR 16 resp/min   Inspiratory PAP 20.0    Expiratory PAP 5.0    pH, Arterial 7.220 (L) 7.350 - 7.450   pCO2 arterial ABOVE REPORATBLE RANGE 32.0 - 48.0 mmHg   pO2, Arterial 58.2 (L) 83.0 - 108.0 mmHg   Bicarbonate 52.2 (H) 20.0 - 28.0 mmol/L   Acid-Base Excess  23.7 (H) 0.0 - 2.0 mmol/L   O2 Saturation 84.6 %    Patient temperature 98.6    Collection site RIGHT RADIAL    Drawn by [phone]    Sample type ARTERIAL    Allens test (pass/fail) PASS PASS  Urine rapid drug screen (hosp performed)     Status: None   Collection Time: 06/09/17  4:25 PM  Result Value Ref Range   Opiates NONE DETECTED NONE DETECTED   Cocaine NONE DETECTED NONE DETECTED   Benzodiazepines NONE DETECTED NONE DETECTED   Amphetamines NONE DETECTED NONE DETECTED   Tetrahydrocannabinol NONE DETECTED NONE DETECTED   Barbiturates NONE DETECTED NONE DETECTED  Blood gas, arterial     Status: Abnormal   Collection Time: 06/09/17  6:11 PM  Result Value Ref Range   FIO2 35.00    Delivery systems BILEVEL POSITIVE AIRWAY PRESSURE    Peep/cpap 5.0 cm H20   Pressure control 20.0 cm H20   pH, Arterial 7.312 (L) 7.350 - 7.450   pCO2 arterial 107 (HH) 32.0 - 48.0 mmHg   pO2, Arterial 60.4 (L) 83.0 - 108.0 mmHg   Bicarbonate 52.5 (H) 20.0 - 28.0 mmol/L   Acid-Base Excess 25.0 (H) 0.0 - 2.0 mmol/L   O2 Saturation 88.9 %   Patient temperature 98.6    Collection site LEFT BRACHIAL    Drawn by [id number]    Sample type ARTERIAL DRAW    Allens test (pass/fail) PASS PASS  Glucose, capillary     Status: Abnormal   Collection Time: 06/09/17  8:01 PM  Result Value Ref Range   Glucose-Capillary 103 (H) 65 - 99 mg/dL  Troponin I     Status: Abnormal   Collection Time: 06/09/17  9:12 PM  Result Value Ref Range   Troponin I 0.06 (HH) <0.03 ng/mL  TSH     Status: None   Collection Time: 06/09/17  9:12 PM  Result Value Ref Range   TSH 0.554 0.350 - 4.500 uIU/mL  Hemoglobin A1c     Status: None   Collection Time: 06/09/17  9:12 PM  Result Value Ref Range   Hgb A1c MFr Bld 5.4 4.8 - 5.6 %   Mean Plasma Glucose 108.28 mg/dL  Magnesium     Status: None   Collection Time: 06/09/17  9:12 PM  Result Value Ref Range   Magnesium 1.9 1.7 - 2.4 mg/dL  Glucose, capillary     Status: Abnormal   Collection Time: 06/09/17 11:46 PM  Result Value Ref Range    Glucose-Capillary 112 (H) 65 - 99 mg/dL  Blood gas, arterial     Status: Abnormal   Collection Time: 06/09/17 11:55 PM  Result Value Ref Range   FIO2 80.00    Delivery systems BILEVEL POSITIVE AIRWAY PRESSURE    Inspiratory PAP 23    Expiratory PAP 5    pH, Arterial 7.166 (LL) 7.350 - 7.450   pCO2 arterial  32.0 - 48.0 mmHg    CRITICAL RESULT CALLED TO, READ BACK BY AND VERIFIED WITH:   pO2, Arterial 108 83.0 - 108.0 mmHg   Bicarbonate 55.5 (H) 20.0 - 28.0 mmol/L   Acid-Base Excess 26.4 (H) 0.0 - 2.0 mmol/L   O2 Saturation 96.8 %   Patient temperature 98.6    Collection site RIGHT RADIAL    Drawn by [phone]    Sample type ARTERIAL DRAW    Allens test (pass/fail) PASS PASS  Glucose, capillary     Status: Abnormal   Collection Time: 06/10/17  1:46 AM  Result Value Ref Range   Glucose-Capillary 133 (H) 65 - 99 mg/dL  MRSA PCR Screening     Status: Abnormal   Collection Time: 06/10/17  2:01 AM  Result Value Ref Range   MRSA by PCR POSITIVE (A) NEGATIVE  I-STAT 3, arterial blood gas (G3+)     Status: Abnormal   Collection Time: 06/10/17  2:35 AM  Result Value Ref Range   pH, Arterial 7.458 (H) 7.350 - 7.450   pCO2 arterial 77.8 (HH) 32.0 - 48.0 mmHg   pO2, Arterial 48.0 (L) 83.0 - 108.0 mmHg   Bicarbonate 55.2 (H) 20.0 - 28.0 mmol/L   TCO2 >50 (H) 22 - 32 mmol/L   O2 Saturation 83.0 %   Acid-Base Excess 26.0 (H) 0.0 - 2.0 mmol/L   Patient temperature 98.0 F    Collection site RADIAL, ALLEN'S TEST ACCEPTABLE    Drawn by RT    Sample type ARTERIAL    Comment NOTIFIED PHYSICIAN   Troponin I     Status: Abnormal   Collection Time: 06/10/17  3:47 AM  Result Value Ref Range   Troponin I 0.05 (HH) <0.03 ng/mL  Comprehensive metabolic panel     Status: Abnormal   Collection Time: 06/10/17  3:47 AM  Result Value Ref Range   Sodium 144 135 - 145 mmol/L   Potassium 5.3 (H) 3.5 - 5.1 mmol/L   Chloride 81 (L) 101 - 111 mmol/L   CO2 47 (H) 22 - 32 mmol/L   Glucose, Bld 119 (H) 65  - 99 mg/dL   BUN 21 (H) 6 - 20 mg/dL   Creatinine, Ser 1.27 (H) 0.61 - 1.24 mg/dL   Calcium 9.3 8.9 - 10.3 mg/dL   Total Protein 7.8 6.5 - 8.1 g/dL   Albumin 3.0 (L) 3.5 - 5.0 g/dL   AST 26 15 - 41 U/L   ALT 13 (L) 17 - 63 U/L   Alkaline Phosphatase 62 38 - 126 U/L   Total Bilirubin 1.1 0.3 - 1.2 mg/dL   GFR calc non Af Amer >60 >60 mL/min   GFR calc Af Amer >60 >60 mL/min   Anion gap 16 (H) 5 - 15  CBC     Status: Abnormal   Collection Time: 06/10/17  3:47 AM  Result Value Ref Range   WBC 8.3 4.0 - 10.5 K/uL   RBC 4.41 4.22 - 5.81 MIL/uL   Hemoglobin 11.3 (L) 13.0 - 17.0 g/dL   HCT 42.5 39.0 - 52.0 %   MCV 96.4 78.0 - 100.0 fL   MCH 25.6 (L) 26.0 - 34.0 pg   MCHC 26.6 (L) 30.0 - 36.0 g/dL   RDW 17.2 (H) 11.5 - 15.5 %   Platelets 212 150 - 400 K/uL  Procalcitonin     Status: None   Collection Time: 06/10/17  3:47 AM  Result Value Ref Range   Procalcitonin 0.23 ng/mL  Triglycerides     Status: None   Collection Time: 06/10/17  3:47 AM  Result Value Ref Range   Triglycerides 78 <150 mg/dL  Glucose, capillary     Status: None   Collection Time: 06/10/17  4:00 AM  Result Value Ref Range   Glucose-Capillary 98 65 - 99 mg/dL   Comment 1 Capillary Specimen    Comment 2 Notify RN   Blood gas, arterial     Status: Abnormal   Collection Time: 06/10/17  5:30 AM  Result Value Ref Range   FIO2 100.00  Delivery systems VENTILATOR    Mode PRESSURE REGULATED VOLUME CONTROL    VT 510 mL   LHR 20 resp/min   Peep/cpap 10.0 cm H20   pH, Arterial 7.592 (H) 7.350 - 7.450   pCO2 arterial 53.8 (H) 32.0 - 48.0 mmHg   pO2, Arterial 70.4 (L) 83.0 - 108.0 mmHg   Bicarbonate 52.1 (H) 20.0 - 28.0 mmol/L   Acid-Base Excess 26.6 (H) 0.0 - 2.0 mmol/L   O2 Saturation 97.2 %   Patient temperature 98.6    Collection site RIGHT RADIAL    Drawn by [phone]    Sample type ARTERIAL DRAW    Allens test (pass/fail) PASS PASS  Glucose, capillary     Status: None   Collection Time: 06/10/17  7:10 AM   Result Value Ref Range   Glucose-Capillary 71 65 - 99 mg/dL   Comment 1 Notify RN   Troponin I     Status: Abnormal   Collection Time: 06/10/17  9:39 AM  Result Value Ref Range   Troponin I 0.05 (HH) <0.03 ng/mL    Best Practice/Protocols:  VTE Prophylaxis: Lovenox (prophylaxtic dose) Continous Sedation ARDS  Events:  Admitted from IMTS this am as Rapid Response.  Intubated for hypercapneic respiratory failure refractory to BiPAP.  Diureses and started on steroids.  Studies: Dg Chest Port 1 View  Result Date: 06/10/2017 CLINICAL DATA:  Respiratory distress EXAM: PORTABLE CHEST 1 VIEW COMPARISON:  06/09/2017, 01/20/2017 FINDINGS: Endotracheal tube tip is about 3.4 cm superior to the carina. Esophageal tube tip is below the diaphragm but not included. Cardiomegaly with vascular congestion and mild perihilar edema. No pleural effusion. No pneumothorax. IMPRESSION: 1. Endotracheal tube tip about 3.4 cm superior to the carina 2. Cardiomegaly with vascular congestion and mild pulmonary edema Electronically Signed   By: Donavan Foil M.D.   On: 06/10/2017 01:38   Dg Chest Portable 1 View  Result Date: 06/09/2017 CLINICAL DATA:  Shortness of breath, COPD, asthma, diabetes mellitus, hypertension, sleep apnea, former smoker EXAM: PORTABLE CHEST 1 VIEW COMPARISON:  Portable exam 1315 hours compared to 01/20/2017 FINDINGS: Enlargement of cardiac silhouette. Prominent mediastinum light which may related to technique. Pulmonary vascularity normal. Low lung volumes with bibasilar atelectasis. Upper lungs clear. No pleural effusion or pneumothorax. IMPRESSION: Enlargement of cardiac silhouette. Low lung volumes with bibasilar atelectasis. Electronically Signed   By: Lavonia Dana M.D.   On: 06/09/2017 13:24   Dg Abd Portable 1v  Result Date: 06/10/2017 CLINICAL DATA:  OG tube placement EXAM: PORTABLE ABDOMEN - 1 VIEW COMPARISON:  03/27/2015 FINDINGS: Esophageal tube tip overlies the gastric body.  Multiple dilated small and large bowel loops with small bowel measuring up to 3.5 cm. IMPRESSION: 1. Esophageal tube tip overlies the gastric body 2. Multiple loops of dilated small and large bowel, ileus versus partial obstruction. Electronically Signed   By: Donavan Foil M.D.   On: 06/10/2017 01:40    Consults: Treatment Team:  Pccm, Md, MD   Subjective:    Overnight Issues: Intubated and diuretics started.  Objective:  Vital signs for last 24 hours: Temp:  [98.3 F (36.8 C)-98.9 F (37.2 C)] 98.3 F (36.8 C) (05/23 1100) Pulse Rate:  [74-94] 82 (05/23 1117) Resp:  [15-37] 21 (05/23 1117) BP: (75-184)/(64-109) 108/83 (05/23 1117) SpO2:  [86 %-100 %] 93 % (05/23 1117) FiO2 (%):  [30 %-100 %] 100 % (05/23 1117) Weight:  [369 lb 14.9 oz (167.8 kg)-372 lb 9.2 oz (169 kg)] 369 lb 14.9 oz (167.8 kg) (  05/23 0200)  Hemodynamic parameters for last 24 hours:  Hypertensive on no vasoactive infusions.  Intake/Output from previous day: 05/22 0701 - 05/23 0700 In: 331.3 [I.V.:331.3] Out: 3435 [Urine:3435]  Intake/Output this shift: Total I/O In: -  Out: 1300 [Urine:1300]  Vent settings for last 24 hours: Vent Mode: PRVC FiO2 (%):  [30 %-100 %] 100 % Set Rate:  [20 bmp] 20 bmp Vt Set:  [510 mL] 510 mL PEEP:  [10 cmH20] 10 cmH20 Plateau Pressure:  [26 cmH20-33 cmH20] 28 cmH20  Physical Exam:  General: no respiratory distress and on propofol and fentanyl for sedation. Neuro: alert, oriented, nonfocal exam and RASS 0 HEENT/Neck: JVD mild Resp: clear to auscultation bilaterally CVS: regular rate and rhythm, S1, S2 normal, no murmur, click, rub or gallop GI: soft, nontender, BS WNL, no r/g Skin: no rash Extremities: no edema, no erythema, pulses WNL, edema 3+ and venous stasis changes.  Assessment/Plan:   NEURO  Altered Mental Status:  encephalopathy and now resolved with correction of hypercapnea.   Plan: Continue to titrate sedation to keep RASS 0  PULM  Acute Respiratory  Failure (due to increased CO2 production and due to volume overload without CHF), Chronic Respiratory Failure and Hypercarbia   Plan: Wean ventilator support. Follow pCO2. SBT tomorrow after further diuresis.  No wheezing so will stop steroids.  CARDIO  Congestive Heart Failure (diastolic) Cardiac function likely exacerbated by hypoxia and hypercarbia   Plan: Continue to diurese. Repeat echocardiogram.  RENAL  Metabolic Alkalosis (severe and posthypercapneic metabolic alkalosis) Chronic Kidney Disease: Stage 3 (GFR 30-59) Clinically volume overload.   Plan: Continue diuresis, add acetazolamide for metabolic alkalosis.  GI  Nutritional Deficiency low nutritional risk.   Plan: hold on initiating tube feeds as may be extubated soon.  ID  Purulent Bronchitis suspected but no leukocytosis and no infiltrate on CXR.   Plan: Stop antibiotics.  HEME  Anemia anemia of chronic disease and anemia of critical illness)   Plan: no transfusion indications  ENDO Diabetes Mellitus (Type 2) currently euglycemic   Plan: Continue basal Lantus and correction scale  Global Issues   Chronic respiratory failure may make weaning difficult.   LOS: 1 day   Additional comments:None  Critical Care Total Time*: 30 Minutes  Minka Knight 06/10/2017  *Care during the described time interval was provided by me and/or other providers on the critical care team.  I have reviewed this patient's available data, including medical history, events of note, physical examination and test results as part of my evaluation.

## 2017-06-10 NOTE — Progress Notes (Signed)
Spoke with Rapid response about concerning VT with the assistance of NIV, obtained an ABG critical results reported back, verbally spoke with Doctor Mcmullin MD aware.

## 2017-06-11 LAB — GLUCOSE, CAPILLARY
GLUCOSE-CAPILLARY: 90 mg/dL (ref 65–99)
GLUCOSE-CAPILLARY: 112 mg/dL — AB (ref 65–99)
Glucose-Capillary: 98 mg/dL (ref 65–99)
Glucose-Capillary: 129 mg/dL — ABNORMAL HIGH (ref 65–99)
Glucose-Capillary: 129 mg/dL — ABNORMAL HIGH (ref 65–99)

## 2017-06-11 LAB — PROCALCITONIN: PROCALCITONIN: 0.21 ng/mL

## 2017-06-11 LAB — TRIGLYCERIDES: Triglycerides: 103 mg/dL (ref <150)

## 2017-06-11 MED ORDER — PROPOFOL 1000 MG/100ML IV EMUL
5.0000 ug/kg/min | INTRAVENOUS | Status: DC
  Administered 2017-06-11: 5 ug/kg/min via INTRAVENOUS
  Administered 2017-06-12: 15 ug/kg/min via INTRAVENOUS
  Administered 2017-06-12 (×2): 30 ug/kg/min via INTRAVENOUS
  Administered 2017-06-12: 20 ug/kg/min via INTRAVENOUS
  Administered 2017-06-13 (×4): 30 ug/kg/min via INTRAVENOUS
  Administered 2017-06-13: 35 ug/kg/min via INTRAVENOUS
  Administered 2017-06-13 – 2017-06-14 (×4): 30 ug/kg/min via INTRAVENOUS
  Administered 2017-06-14: 20 ug/kg/min via INTRAVENOUS
  Administered 2017-06-15 – 2017-06-16 (×4): 15 ug/kg/min via INTRAVENOUS
  Administered 2017-06-16 – 2017-06-17 (×3): 14 ug/kg/min via INTRAVENOUS
  Administered 2017-06-18 (×2): 15 ug/kg/min via INTRAVENOUS
  Filled 2017-06-11 (×30): qty 100

## 2017-06-11 MED ORDER — FENTANYL CITRATE (PF) 100 MCG/2ML IJ SOLN
INTRAMUSCULAR | Status: AC
  Administered 2017-06-11: 100 ug
  Filled 2017-06-11: qty 2

## 2017-06-11 MED ORDER — FENTANYL 2500MCG IN NS 250ML (10MCG/ML) PREMIX INFUSION
0.0000 ug/h | INTRAVENOUS | Status: DC
  Administered 2017-06-11: 25 ug/h via INTRAVENOUS
  Administered 2017-06-12 – 2017-06-13 (×4): 400 ug/h via INTRAVENOUS
  Administered 2017-06-13 – 2017-06-16 (×7): 375 ug/h via INTRAVENOUS
  Administered 2017-06-16: 190 ug/h via INTRAVENOUS
  Administered 2017-06-17: 50 ug/h via INTRAVENOUS
  Filled 2017-06-11 (×20): qty 250

## 2017-06-11 MED ORDER — HYDROMORPHONE HCL 1 MG/ML IJ SOLN
2.0000 mg | INTRAMUSCULAR | Status: DC | PRN
  Administered 2017-06-11 (×2): 2 mg via INTRAVENOUS
  Filled 2017-06-11 (×3): qty 2

## 2017-06-11 MED ORDER — DEXMEDETOMIDINE HCL IN NACL 400 MCG/100ML IV SOLN
0.4000 ug/kg/h | INTRAVENOUS | Status: DC
Start: 2017-06-11 — End: 2017-06-11
  Administered 2017-06-11: 0.4 ug/kg/h via INTRAVENOUS
  Administered 2017-06-11: 1 ug/kg/h via INTRAVENOUS
  Administered 2017-06-11: 1.2 ug/kg/h via INTRAVENOUS
  Filled 2017-06-11 (×4): qty 100

## 2017-06-11 MED ORDER — PROPOFOL 1000 MG/100ML IV EMUL
INTRAVENOUS | Status: AC
  Filled 2017-06-11: qty 100

## 2017-06-11 NOTE — Progress Notes (Signed)
Initial Nutrition Assessment  DOCUMENTATION CODES:   Morbid obesity  INTERVENTION:   If unable to extubate patient within the next 24 hours, recommend start TF:  Vital High Protein at 50 ml/h (1200 ml per day)  Pro-stat 60 ml BID  Provides 1600 kcal, 165 gm protein, 1003 ml free water daily  NUTRITION DIAGNOSIS:   Inadequate oral intake related to inability to eat as evidenced by NPO status.  GOAL:   Provide needs based on ASPEN/SCCM guidelines  MONITOR:   Vent status, Labs, I & O's  REASON FOR ASSESSMENT:   Ventilator    ASSESSMENT:   57 yo male with PMH of morbid obesity, asthma, HTN, sleep apnea, COPD, and DM who was admitted on 5/22 with acute respiratory failure, COPD exacerbation, likely new onset HF. Required intubation shortly after admission.  Discussed patient with RN today. OGT in place. Patient is currently intubated on ventilator support Temp (24hrs), Avg:98.4 F (36.9 C), Min:98.1 F (36.7 C), Max:98.8 F (37.1 C)  Propofol: off Labs reviewed. Potassium 5.3 (H) CBG's: 98-90-112-129 Medications reviewed and include Lasix, Novolog, Lantus, Precedex.   NUTRITION - FOCUSED PHYSICAL EXAM:    Most Recent Value  Orbital Region  No depletion  Upper Arm Region  No depletion  Thoracic and Lumbar Region  Unable to assess  Buccal Region  No depletion  Temple Region  No depletion  Clavicle Bone Region  No depletion  Clavicle and Acromion Bone Region  No depletion  Scapular Bone Region  Unable to assess  Dorsal Hand  Unable to assess  Patellar Region  No depletion  Anterior Thigh Region  No depletion  Posterior Calf Region  No depletion  Edema (RD Assessment)  Mild  Hair  Reviewed  Eyes  Unable to assess  Mouth  Unable to assess  Skin  Reviewed  Nails  Unable to assess       Diet Order:   Diet Order           Diet NPO time specified  Diet effective now          EDUCATION NEEDS:   No education needs have been identified at this  time  Skin:  Skin Assessment: Reviewed RN Assessment  Last BM:  5/23  Height:   Ht Readings from Last 1 Encounters:  06/10/17 5\' 6"  (1.676 m)    Weight:   Wt Readings from Last 1 Encounters:  06/11/17 (!) 361 lb 1.8 oz (163.8 kg)    Ideal Body Weight:  64.5 kg  BMI:  Body mass index is 58.29 kg/m.  Estimated Nutritional Needs:   Kcal:  4235-3614  Protein:  161 gm  Fluid:  2 L    Molli Barrows, RD, LDN, [place] Pager [phone] After Hours Pager [phone]

## 2017-06-11 NOTE — Progress Notes (Signed)
eLink Physician-Brief Progress Note Patient Name: Timothy Le DOB: 08/25/1960 MRN: [id number]   Date of Service  06/11/2017  HPI/Events of Note  Severe agitation.  Patient attempting to pull out ETT and swinging legs out of bed.  eICU Interventions  Start fentanyl drip. Wrist and ankle restraints. Precedex at high dose leading to bradycardia     Intervention Category Minor Interventions: Agitation / anxiety - evaluation and management  Mauri Brooklyn, P 06/11/2017, 9:27 PM

## 2017-06-11 NOTE — Progress Notes (Addendum)
Follow up - Critical Care Medicine Note  Patient Details:    Timothy Le is an 57 y.o. male.  Admitted initially to the IMTS for worsening dyspnea.  Initially started on BiPAP, but became progressively obtunded and hypercapnic with copious secretions. He was intubated and admitted to the intensive care unit.  He  has a past medical history of Asthma, COPD (chronic obstructive pulmonary disease) ([place]), Diabetes mellitus without complication ([place]), Hypertension, Shortness of breath dyspnea, and Sleep apnea.  He is not compliant with his CPAP.  Lines, Airways, Drains: Airway 6.5 mm (Active)  Secured at (cm) 27 cm 06/10/2017 11:17 AM  Measured From Lips 06/10/2017 11:17 AM  Secured Location Left 06/10/2017 11:17 AM  Secured By [employer] 06/10/2017 11:17 AM  Tube Holder Repositioned Yes 06/10/2017 11:17 AM  Cuff Pressure (cm H2O) 40 cm H2O 06/10/2017  8:32 AM  Site Condition Dry 06/10/2017 11:17 AM     NG/OG Tube Orogastric Center mouth Xray Measured external length of tube (Active)  Ongoing Placement Verification No change in cm markings or external length of tube from initial placement 06/10/2017  8:00 AM  Status Clamped 06/10/2017  8:00 AM     Urethral Catheter Forestine Na, RN Latex;Double-lumen;Straight-tip 16 Fr. (Active)  Indication for Insertion or Continuance of Catheter Unstable critical patients (first 24-48 hours);Aggressive IV diuresis 06/10/2017  8:00 AM  Site Assessment Clean;Intact 06/10/2017  8:00 AM  Catheter Maintenance Bag below level of bladder;Catheter secured;Insertion date on drainage bag;Drainage bag/tubing not touching floor;No dependent loops;Seal intact 06/10/2017  8:00 AM  Collection Container Standard drainage bag 06/10/2017  8:00 AM  Securement Method Leg strap 06/10/2017  8:00 AM  Urinary Catheter Interventions Unclamped 06/10/2017  8:00 AM  Output (mL) 1100 mL 06/10/2017 10:00 AM    Anti-infectives:  Anti-infectives (From admission, onward)   Start      Dose/Rate Route Frequency Ordered Stop   06/10/17 2200  levofloxacin (LEVAQUIN) IVPB 750 mg     750 mg 100 mL/hr over 90 Minutes Intravenous Every 24 hours 06/10/17 1031     06/09/17 2230  levofloxacin (LEVAQUIN) IVPB 750 mg  Status:  Discontinued     750 mg 100 mL/hr over 90 Minutes Intravenous Every 24 hours 06/09/17 2120 06/10/17 0104      Microbiology: Results for orders placed or performed during the hospital encounter of 06/09/17  MRSA PCR Screening     Status: Abnormal   Collection Time: 06/10/17  2:01 AM  Result Value Ref Range Status   MRSA by PCR POSITIVE (A) NEGATIVE Final    Comment:        The GeneXpert MRSA Assay (FDA approved for NASAL specimens only), is one component of a comprehensive MRSA colonization surveillance program. It is not intended to diagnose MRSA infection nor to guide or monitor treatment for MRSA infections. RESULT CALLED TO, READ BACK BY AND VERIFIED WITH: T HOOD,RN AT 0850 06/10/17 BY L BENFIELD Performed at [facility], [address], [place], [place] [address]    Laboratory: BMET    Component Value Date/Time   NA 144 06/10/2017 0347   K 5.3 (H) 06/10/2017 0347   CL 81 (L) 06/10/2017 0347   CO2 47 (H) 06/10/2017 0347   GLUCOSE 119 (H) 06/10/2017 0347   BUN 21 (H) 06/10/2017 0347   CREATININE 1.27 (H) 06/10/2017 0347   CALCIUM 9.3 06/10/2017 0347   GFRNONAA >60 06/10/2017 0347   GFRAA >60 06/10/2017 0347   CBC    Component  Value Date/Time   WBC 8.3 06/10/2017 0347   RBC 4.41 06/10/2017 0347   HGB 11.3 (L) 06/10/2017 0347   HCT 42.5 06/10/2017 0347   PLT 212 06/10/2017 0347   MCV 96.4 06/10/2017 0347   MCH 25.6 (L) 06/10/2017 0347   MCHC 26.6 (L) 06/10/2017 0347   RDW 17.2 (H) 06/10/2017 0347   LYMPHSABS 0.8 06/09/2017 1309   MONOABS 0.4 06/09/2017 1309   EOSABS 0.0 06/09/2017 1309   BASOSABS 0.0 06/09/2017 1309   ABG    Component Value Date/Time   PHART 7.592 (H) 06/10/2017 0530   PCO2ART 53.8 (H) 06/10/2017  0530   PO2ART 70.4 (L) 06/10/2017 0530   HCO3 52.1 (H) 06/10/2017 0530   TCO2 >50 (H) 06/10/2017 0235   O2SAT 97.2 06/10/2017 0530    Best Practice/Protocols:  VTE Prophylaxis: Lovenox (prophylaxtic dose) Continous Sedation ARDS  Events:  Admitted from IMTS this am as Rapid Response.  Intubated for hypercapneic respiratory failure refractory to BiPAP.  Diuresed and started on steroids.  Studies: Dg Chest Port 1 View  Result Date: 06/10/2017 CLINICAL DATA:  Respiratory distress EXAM: PORTABLE CHEST 1 VIEW COMPARISON:  06/09/2017, 01/20/2017 FINDINGS: Endotracheal tube tip is about 3.4 cm superior to the carina. Esophageal tube tip is below the diaphragm but not included. Cardiomegaly with vascular congestion and mild perihilar edema. No pleural effusion. No pneumothorax. IMPRESSION: 1. Endotracheal tube tip about 3.4 cm superior to the carina 2. Cardiomegaly with vascular congestion and mild pulmonary edema Electronically Signed   By: Donavan Foil M.D.   On: 06/10/2017 01:38   Dg Chest Portable 1 View  Result Date: 06/09/2017 CLINICAL DATA:  Shortness of breath, COPD, asthma, diabetes mellitus, hypertension, sleep apnea, former smoker EXAM: PORTABLE CHEST 1 VIEW COMPARISON:  Portable exam 1315 hours compared to 01/20/2017 FINDINGS: Enlargement of cardiac silhouette. Prominent mediastinum light which may related to technique. Pulmonary vascularity normal. Low lung volumes with bibasilar atelectasis. Upper lungs clear. No pleural effusion or pneumothorax. IMPRESSION: Enlargement of cardiac silhouette. Low lung volumes with bibasilar atelectasis. Electronically Signed   By: Lavonia Dana M.D.   On: 06/09/2017 13:24   Dg Abd Portable 1v  Result Date: 06/10/2017 CLINICAL DATA:  OG tube placement EXAM: PORTABLE ABDOMEN - 1 VIEW COMPARISON:  03/27/2015 FINDINGS: Esophageal tube tip overlies the gastric body. Multiple dilated small and large bowel loops with small bowel measuring up to 3.5 cm.  IMPRESSION: 1. Esophageal tube tip overlies the gastric body 2. Multiple loops of dilated small and large bowel, ileus versus partial obstruction. Electronically Signed   By: Donavan Foil M.D.   On: 06/10/2017 01:40    Consults: Treatment Team:  Pccm, Md, MD   Subjective:    Overnight Issues:  - 5L diuresis overnight. Failed SBT this am but patient has 6.5 tube in place.    Objective:  Vital signs for last 24 hours: Temp:  [98.1 F (36.7 C)-98.8 F (37.1 C)] 98.2 F (36.8 C) (05/24 1145) Pulse Rate:  [54-78] 58 (05/24 1300) Resp:  [12-29] 16 (05/24 1300) BP: (95-126)/(56-97) 103/61 (05/24 1300) SpO2:  [89 %-100 %] 92 % (05/24 1332) FiO2 (%):  [40 %-100 %] 40 % (05/24 1332) Weight:  [361 lb 1.8 oz (163.8 kg)] 361 lb 1.8 oz (163.8 kg) (05/24 0500)  Hemodynamic parameters for last 24 hours:  Hypertensive on no vasoactive infusions.  Intake/Output from previous day: 05/23 0701 - 05/24 0700 In: 1616.9 [I.V.:1366.9; IV Piggyback:250] Out: 8341 [Urine:3945]  Intake/Output this shift: Total I/O  In: 547.6 [I.V.:497.6; IV Piggyback:50] Out: 725 [Urine:725]  Vent settings for last 24 hours: Vent Mode: PRVC FiO2 (%):  [40 %-100 %] 40 % Set Rate:  [12 bmp-20 bmp] 12 bmp Vt Set:  [510 mL] 510 mL PEEP:  [8 cmH20-10 cmH20] 10 cmH20 Pressure Support:  [14 cmH20] 14 cmH20 Plateau Pressure:  [24 TKP54-65 cmH20] 24 cmH20  Physical Exam:  General: no respiratory distress and off propofol and fentanyl for wake up assessment. Neuro: alert, oriented, nonfocal exam and RASS 0 HEENT/Neck: JVD mild Resp: clear to auscultation bilaterally CVS: regular rate and rhythm, S1, S2 normal, no murmur, click, rub or gallop GI: soft, nontender, BS WNL, no r/g Skin: no rash Extremities: no edema, no erythema, pulses WNL, edema 3+ and venous stasis changes.  Assessment/Plan:   NEURO  Altered Mental Status:  encephalopathy   Plan: Sedation vacation.   PULM  Acute Respiratory Failure (due to  increased CO2 production and due to volume overload without CHF), Chronic Respiratory Failure and Hypercarbia   Plan: Very small tube. Attempt SBT and extubate to BiPAP  CARDIO  Congestive Heart Failure (diastolic) Cardiac function likely exacerbated by hypoxia and hypercarbia   Plan: Continue to diurese. Repeat echocardiogram.  RENAL  Metabolic Alkalosis (severe and posthypercapneic metabolic alkalosis) Chronic Kidney Disease: Stage 3 (GFR 30-59) Clinically volume overload.   Plan: Continue diuresis, add acetazolamide for metabolic alkalosis.  GI  Nutritional Deficiency low nutritional risk.   Plan: hold on initiating tube feeds as may be extubated soon.  ID  Purulent Bronchitis suspected but no leukocytosis and no infiltrate on CXR.   Plan: Stop antibiotics.  HEME  Anemia anemia of chronic disease and anemia of critical illness)   Plan: no transfusion indications  ENDO Diabetes Mellitus (Type 2) currently euglycemic   Plan: Continue basal Lantus and correction scale  Global Issues   Chronic respiratory failure may make weaning difficult.   LOS: 2 days   Additional comments:None  Critical Care Total Time*: 45Minutes  Shafer Swamy 06/11/2017   ADDENDUM:  Attempted SBT but patient repeatedly triggered apnea alarm.  We will dexmedetomidine and intermittent hydromorphone and allow prior sedatives to washout to improve respiratory drive.  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  *Care during the described time interval was provided by me and/or other providers on the critical care team.  I have reviewed this patient's available data, including medical history, events of note, physical examination and test results as part of my evaluation.

## 2017-06-12 LAB — BASIC METABOLIC PANEL
Anion gap: 16 — ABNORMAL HIGH (ref 5–15)
BUN: 35 mg/dL — AB (ref 6–20)
CO2: 34 mmol/L — AB (ref 22–32)
CREATININE: 1.99 mg/dL — AB (ref 0.61–1.24)
Calcium: 9.2 mg/dL (ref 8.9–10.3)
Chloride: 89 mmol/L — ABNORMAL LOW (ref 101–111)
GFR calc non Af Amer: 36 mL/min — ABNORMAL LOW (ref >60)
GFR, EST AFRICAN AMERICAN: 42 mL/min — AB (ref >60)
GLUCOSE: 97 mg/dL (ref 65–99)
Potassium: 2.9 mmol/L — ABNORMAL LOW (ref 3.5–5.1)
Sodium: 139 mmol/L (ref 135–145)

## 2017-06-12 LAB — CBC WITH DIFFERENTIAL/PLATELET
Abs Immature Granulocytes: 0 10*3/uL (ref 0.0–0.1)
BASOS PCT: 0 %
Basophils Absolute: 0 10*3/uL (ref 0.0–0.1)
EOS ABS: 0 10*3/uL (ref 0.0–0.7)
EOS PCT: 0 %
HCT: 36.7 % — ABNORMAL LOW (ref 39.0–52.0)
Hemoglobin: 10.6 g/dL — ABNORMAL LOW (ref 13.0–17.0)
Immature Granulocytes: 1 %
Lymphocytes Relative: 25 %
Lymphs Abs: 1.5 10*3/uL (ref 0.7–4.0)
MCH: 26.1 pg (ref 26.0–34.0)
MCHC: 28.9 g/dL — ABNORMAL LOW (ref 30.0–36.0)
MCV: 90.4 fL (ref 78.0–100.0)
MONO ABS: 0.7 10*3/uL (ref 0.1–1.0)
MONOS PCT: 11 %
Neutro Abs: 3.9 10*3/uL (ref 1.7–7.7)
Neutrophils Relative %: 63 %
Platelets: 200 10*3/uL (ref 150–400)
RBC: 4.06 MIL/uL — ABNORMAL LOW (ref 4.22–5.81)
RDW: 17.6 % — AB (ref 11.5–15.5)
WBC: 6.2 10*3/uL (ref 4.0–10.5)

## 2017-06-12 LAB — GLUCOSE, CAPILLARY
GLUCOSE-CAPILLARY: 99 mg/dL (ref 65–99)
Glucose-Capillary: 102 mg/dL — ABNORMAL HIGH (ref 65–99)
Glucose-Capillary: 119 mg/dL — ABNORMAL HIGH (ref 65–99)
Glucose-Capillary: 73 mg/dL (ref 65–99)
Glucose-Capillary: 84 mg/dL (ref 65–99)
Glucose-Capillary: 95 mg/dL (ref 65–99)

## 2017-06-12 MED ORDER — POTASSIUM CHLORIDE 20 MEQ PO PACK
40.0000 meq | PACK | Freq: Two times a day (BID) | ORAL | Status: DC
  Administered 2017-06-12: 40 meq via ORAL
  Filled 2017-06-12 (×2): qty 2

## 2017-06-12 MED ORDER — FUROSEMIDE 10 MG/ML IJ SOLN
40.0000 mg | Freq: Two times a day (BID) | INTRAMUSCULAR | Status: DC
  Administered 2017-06-13: 40 mg via INTRAVENOUS
  Filled 2017-06-12: qty 4

## 2017-06-12 NOTE — Progress Notes (Signed)
Patient popping self off ventilator frequently with intermittent agitation with no relief from PRNs ,  became combative and very agitated, trying to pull tube out. [facility] notified, verbal order for 100mg  fentanyl. MD placed orders for a fentanyl drip and ankle restraints. Will continue to monitor.

## 2017-06-12 NOTE — Progress Notes (Signed)
Continued agitation despite max dose of precedex and fentanyl. Patient combative  And trying to pull ETT tube out. MD notifed. Orders placed for propofol. Will continue to monitor.

## 2017-06-12 NOTE — Progress Notes (Signed)
Patient bradycardic, propofol titrated down to 13 with no change. [place] notified.

## 2017-06-12 NOTE — Progress Notes (Signed)
RT note-Moderate amount secretions with small amount bloody plugs. PIP/Plat better than this AM, MD was aware poor ventilation. Patient has ETT 6.5 and at the hub is secured at 27cm

## 2017-06-12 NOTE — Progress Notes (Signed)
Follow up - Critical Care Medicine Note  Patient Details:    Timothy Le is an 57 y.o. male.  Admitted initially to the IMTS for worsening dyspnea.  Initially started on BiPAP, but became progressively obtunded and hypercapnic with copious secretions. He was intubated and admitted to the intensive care unit.  He  has a past medical history of Asthma, COPD (chronic obstructive pulmonary disease) ([place]), Diabetes mellitus without complication ([place]), Hypertension, Shortness of breath dyspnea, and Sleep apnea.  He is not compliant with his CPAP.  Lines, Airways, Drains: Airway 6.5 mm (Active)  Secured at (cm) 27 cm 06/10/2017 11:17 AM  Measured From Lips 06/10/2017 11:17 AM  Secured Location Left 06/10/2017 11:17 AM  Secured By [employer] 06/10/2017 11:17 AM  Tube Holder Repositioned Yes 06/10/2017 11:17 AM  Cuff Pressure (cm H2O) 40 cm H2O 06/10/2017  8:32 AM  Site Condition Dry 06/10/2017 11:17 AM     NG/OG Tube Orogastric Center mouth Xray Measured external length of tube (Active)  Ongoing Placement Verification No change in cm markings or external length of tube from initial placement 06/10/2017  8:00 AM  Status Clamped 06/10/2017  8:00 AM     Urethral Catheter Forestine Na, RN Latex;Double-lumen;Straight-tip 16 Fr. (Active)  Indication for Insertion or Continuance of Catheter Unstable critical patients (first 24-48 hours);Aggressive IV diuresis 06/10/2017  8:00 AM  Site Assessment Clean;Intact 06/10/2017  8:00 AM  Catheter Maintenance Bag below level of bladder;Catheter secured;Insertion date on drainage bag;Drainage bag/tubing not touching floor;No dependent loops;Seal intact 06/10/2017  8:00 AM  Collection Container Standard drainage bag 06/10/2017  8:00 AM  Securement Method Leg strap 06/10/2017  8:00 AM  Urinary Catheter Interventions Unclamped 06/10/2017  8:00 AM  Output (mL) 1100 mL 06/10/2017 10:00 AM    Anti-infectives:  Anti-infectives (From admission, onward)   Start      Dose/Rate Route Frequency Ordered Stop   06/10/17 2200  levofloxacin (LEVAQUIN) IVPB 750 mg  Status:  Discontinued     750 mg 100 mL/hr over 90 Minutes Intravenous Every 24 hours 06/10/17 1031 06/11/17 1535   06/09/17 2230  levofloxacin (LEVAQUIN) IVPB 750 mg  Status:  Discontinued     750 mg 100 mL/hr over 90 Minutes Intravenous Every 24 hours 06/09/17 2120 06/10/17 0104      Microbiology: Results for orders placed or performed during the hospital encounter of 06/09/17  MRSA PCR Screening     Status: Abnormal   Collection Time: 06/10/17  2:01 AM  Result Value Ref Range Status   MRSA by PCR POSITIVE (A) NEGATIVE Final    Comment:        The GeneXpert MRSA Assay (FDA approved for NASAL specimens only), is one component of a comprehensive MRSA colonization surveillance program. It is not intended to diagnose MRSA infection nor to guide or monitor treatment for MRSA infections. RESULT CALLED TO, READ BACK BY AND VERIFIED WITH: T HOOD,RN AT 0850 06/10/17 BY L BENFIELD Performed at [facility], [place] [address], [place], [place] [address]    Laboratory: BMET    Component Value Date/Time   NA 139 06/12/2017 0617   K 2.9 (L) 06/12/2017 0617   CL 89 (L) 06/12/2017 0617   CO2 34 (H) 06/12/2017 0617   GLUCOSE 97 06/12/2017 0617   BUN 35 (H) 06/12/2017 0617   CREATININE 1.99 (H) 06/12/2017 0617   CALCIUM 9.2 06/12/2017 0617   GFRNONAA 36 (L) 06/12/2017 0617   GFRAA 42 (L) 06/12/2017 0617  CBC    Component Value Date/Time   WBC 6.2 06/12/2017 0617   RBC 4.06 (L) 06/12/2017 0617   HGB 10.6 (L) 06/12/2017 0617   HCT 36.7 (L) 06/12/2017 0617   PLT 200 06/12/2017 0617   MCV 90.4 06/12/2017 0617   MCH 26.1 06/12/2017 0617   MCHC 28.9 (L) 06/12/2017 0617   RDW 17.6 (H) 06/12/2017 0617   LYMPHSABS 1.5 06/12/2017 0617   MONOABS 0.7 06/12/2017 0617   EOSABS 0.0 06/12/2017 0617   BASOSABS 0.0 06/12/2017 0617   ABG    Component Value Date/Time   PHART 7.592 (H)  06/10/2017 0530   PCO2ART 53.8 (H) 06/10/2017 0530   PO2ART 70.4 (L) 06/10/2017 0530   HCO3 52.1 (H) 06/10/2017 0530   TCO2 >50 (H) 06/10/2017 0235   O2SAT 97.2 06/10/2017 0530    Best Practice/Protocols:  VTE Prophylaxis: Lovenox (prophylaxtic dose) Continous Sedation ARDS  Events:  Admitted from IMTS this am as Rapid Response.  Intubated for hypercapneic respiratory failure refractory to BiPAP.  Diuresed and started on steroids.  Studies: Dg Chest Port 1 View  Result Date: 06/10/2017 CLINICAL DATA:  Respiratory distress EXAM: PORTABLE CHEST 1 VIEW COMPARISON:  06/09/2017, 01/20/2017 FINDINGS: Endotracheal tube tip is about 3.4 cm superior to the carina. Esophageal tube tip is below the diaphragm but not included. Cardiomegaly with vascular congestion and mild perihilar edema. No pleural effusion. No pneumothorax. IMPRESSION: 1. Endotracheal tube tip about 3.4 cm superior to the carina 2. Cardiomegaly with vascular congestion and mild pulmonary edema Electronically Signed   By: Donavan Foil M.D.   On: 06/10/2017 01:38   Dg Chest Portable 1 View  Result Date: 06/09/2017 CLINICAL DATA:  Shortness of breath, COPD, asthma, diabetes mellitus, hypertension, sleep apnea, former smoker EXAM: PORTABLE CHEST 1 VIEW COMPARISON:  Portable exam 1315 hours compared to 01/20/2017 FINDINGS: Enlargement of cardiac silhouette. Prominent mediastinum light which may related to technique. Pulmonary vascularity normal. Low lung volumes with bibasilar atelectasis. Upper lungs clear. No pleural effusion or pneumothorax. IMPRESSION: Enlargement of cardiac silhouette. Low lung volumes with bibasilar atelectasis. Electronically Signed   By: Lavonia Dana M.D.   On: 06/09/2017 13:24   Dg Abd Portable 1v  Result Date: 06/10/2017 CLINICAL DATA:  OG tube placement EXAM: PORTABLE ABDOMEN - 1 VIEW COMPARISON:  03/27/2015 FINDINGS: Esophageal tube tip overlies the gastric body. Multiple dilated small and large bowel loops  with small bowel measuring up to 3.5 cm. IMPRESSION: 1. Esophageal tube tip overlies the gastric body 2. Multiple loops of dilated small and large bowel, ileus versus partial obstruction. Electronically Signed   By: Donavan Foil M.D.   On: 06/10/2017 01:40    Consults: Treatment Team:  Pccm, Md, MD   Subjective:    Overnight Issues:  - 7.7 L since admission. Wakes up promptly on sedation pause. Period of high airway pressure. Improved spontaneously.  Objective:  Vital signs for last 24 hours: Temp:  [97.2 F (36.2 C)-98.4 F (36.9 C)] 97.3 F (36.3 C) (05/25 1636) Pulse Rate:  [42-70] 67 (05/25 1800) Resp:  [10-27] 15 (05/25 1800) BP: (82-132)/(50-89) 114/80 (05/25 1800) SpO2:  [74 %-100 %] 97 % (05/25 1800) FiO2 (%):  [40 %-50 %] 40 % (05/25 1533) Weight:  [357 lb 5.9 oz (162.1 kg)] 357 lb 5.9 oz (162.1 kg) (05/25 0356)  Hemodynamic parameters for last 24 hours:  Hypertensive on no vasoactive infusions.  Intake/Output from previous day: 05/24 0701 - 05/25 0700 In: 1737.7 [I.V.:1637.7; IV Piggyback:100] Out: 3070 [  Urine:3070]  Intake/Output this shift: Total I/O In: 829.8 [I.V.:829.8] Out: 1855 [Urine:1855]  Vent settings for last 24 hours: Vent Mode: PRVC FiO2 (%):  [40 %-50 %] 40 % Set Rate:  [12 bmp-15 bmp] 12 bmp Vt Set:  [510 mL] 510 mL PEEP:  [10 cmH20] 10 cmH20 Plateau Pressure:  [21 IPJ82-50 cmH20] 26 cmH20  Physical Exam:  General: no respiratory distress and off propofol and fentanyl for wake up assessment. Neuro: alert, oriented, nonfocal exam and RASS 0 HEENT/Neck: JVD mild Resp: clear to auscultation bilaterally CVS: regular rate and rhythm, S1, S2 normal, no murmur, click, rub or gallop GI: soft, nontender, BS WNL, no r/g Skin: no rash Extremities: no edema, no erythema, pulses WNL, edema 3+ and venous stasis changes.  Assessment/Plan:   NEURO  Altered Mental Status:  encephalopathy   Plan: Sedation vacation.   PULM  Acute Respiratory Failure  (due to increased CO2 production and due to volume overload without CHF), Chronic Respiratory Failure and Hypercarbia   Plan: Very small tube. Continue to use for now and consider extubation in OR as high risk of needing tracheostomy  CARDIO  Congestive Heart Failure (diastolic) Cardiac function likely exacerbated by hypoxia and hypercarbia   Plan: Continue to diurese. Repeat echocardiogram.  RENAL  Metabolic Alkalosis (severe and posthypercapneic metabolic alkalosis) Chronic Kidney Disease: Stage 3 (GFR 30-59) Hypokalemia. Clinically volume overload.   Plan: Continue diuresis, add acetazolamide for metabolic alkalosis. Replace potassium.  GI  Nutritional Deficiency low nutritional risk.   Plan: hold on initiating tube feeds as may be extubated soon.  ID  Purulent Bronchitis suspected but no leukocytosis and no infiltrate on CXR.   Plan: Stop antibiotics.  HEME  Anemia anemia of chronic disease and anemia of critical illness)   Plan: no transfusion indications  ENDO Diabetes Mellitus (Type 2) currently euglycemic   Plan: Continue basal Lantus and correction scale  Global Issues   Chronic respiratory failure may make weaning difficult.   LOS: 3 days   Additional comments:None  Critical Care Total Time*: 45Minutes  Deaundre Allston 06/12/2017   ADDENDUM:  Attempted SBT but patient repeatedly triggered apnea alarm.  We will dexmedetomidine and intermittent hydromorphone and allow prior sedatives to washout to improve respiratory drive.  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  *Care during the described time interval was provided by me and/or other providers on the critical care team.  I have reviewed this patient's available data, including medical history, events of note, physical examination and test results as part of my evaluation.

## 2017-06-13 LAB — CBC WITH DIFFERENTIAL/PLATELET
BASOS ABS: 0.1 10*3/uL (ref 0.0–0.1)
BASOS PCT: 1 %
EOS ABS: 0.1 10*3/uL (ref 0.0–0.7)
Eosinophils Relative: 1 %
HEMATOCRIT: 41.9 % (ref 39.0–52.0)
HEMOGLOBIN: 11.8 g/dL — AB (ref 13.0–17.0)
LYMPHS PCT: 19 %
Lymphs Abs: 1.1 10*3/uL (ref 0.7–4.0)
MCH: 25.7 pg — ABNORMAL LOW (ref 26.0–34.0)
MCHC: 28.2 g/dL — AB (ref 30.0–36.0)
MCV: 91.3 fL (ref 78.0–100.0)
MONO ABS: 0.6 10*3/uL (ref 0.1–1.0)
Monocytes Relative: 10 %
NEUTROS PCT: 69 %
Neutro Abs: 3.9 10*3/uL (ref 1.7–7.7)
Platelets: 200 10*3/uL (ref 150–400)
RBC: 4.59 MIL/uL (ref 4.22–5.81)
RDW: 18.5 % — ABNORMAL HIGH (ref 11.5–15.5)
WBC: 5.8 10*3/uL (ref 4.0–10.5)

## 2017-06-13 LAB — GLUCOSE, CAPILLARY
GLUCOSE-CAPILLARY: 76 mg/dL (ref 65–99)
Glucose-Capillary: 104 mg/dL — ABNORMAL HIGH (ref 65–99)
Glucose-Capillary: 76 mg/dL (ref 65–99)
Glucose-Capillary: 84 mg/dL (ref 65–99)
Glucose-Capillary: 88 mg/dL (ref 65–99)
Glucose-Capillary: 97 mg/dL (ref 65–99)

## 2017-06-13 LAB — MAGNESIUM
MAGNESIUM: 2.2 mg/dL (ref 1.7–2.4)
MAGNESIUM: 2 mg/dL (ref 1.7–2.4)

## 2017-06-13 LAB — BASIC METABOLIC PANEL
ANION GAP: 16 — AB (ref 5–15)
BUN: 34 mg/dL — ABNORMAL HIGH (ref 6–20)
CALCIUM: 9.1 mg/dL (ref 8.9–10.3)
CO2: 35 mmol/L — ABNORMAL HIGH (ref 22–32)
CREATININE: 2.02 mg/dL — AB (ref 0.61–1.24)
Chloride: 89 mmol/L — ABNORMAL LOW (ref 101–111)
GFR, EST AFRICAN AMERICAN: 41 mL/min — AB (ref >60)
GFR, EST NON AFRICAN AMERICAN: 35 mL/min — AB (ref >60)
Glucose, Bld: 84 mg/dL (ref 65–99)
Potassium: 3.1 mmol/L — ABNORMAL LOW (ref 3.5–5.1)
SODIUM: 140 mmol/L (ref 135–145)

## 2017-06-13 LAB — PHOSPHORUS
Phosphorus: 4.3 mg/dL (ref 2.5–4.6)
Phosphorus: 3.7 mg/dL (ref 2.5–4.6)

## 2017-06-13 MED ORDER — VITAL HIGH PROTEIN PO LIQD
1000.0000 mL | ORAL | Status: DC
  Administered 2017-06-13 – 2017-06-14 (×3): 1000 mL

## 2017-06-13 MED ORDER — POTASSIUM CHLORIDE 20 MEQ/15ML (10%) PO SOLN
40.0000 meq | Freq: Two times a day (BID) | ORAL | Status: AC
  Administered 2017-06-13 – 2017-06-15 (×5): 40 meq
  Filled 2017-06-13 (×5): qty 30

## 2017-06-13 MED ORDER — PRO-STAT SUGAR FREE PO LIQD
30.0000 mL | Freq: Two times a day (BID) | ORAL | Status: DC
  Administered 2017-06-13 (×2): 30 mL
  Filled 2017-06-13 (×3): qty 30

## 2017-06-13 MED ORDER — HYDROMORPHONE HCL 2 MG PO TABS
2.0000 mg | ORAL_TABLET | Freq: Four times a day (QID) | ORAL | Status: DC
  Administered 2017-06-13 – 2017-06-15 (×8): 2 mg
  Filled 2017-06-13 (×8): qty 1

## 2017-06-13 MED ORDER — TORSEMIDE 10 MG PO TABS
10.0000 mg | ORAL_TABLET | Freq: Every day | ORAL | Status: DC
  Administered 2017-06-13 – 2017-06-21 (×8): 10 mg via ORAL
  Filled 2017-06-13 (×11): qty 1

## 2017-06-13 MED ORDER — QUETIAPINE FUMARATE 50 MG PO TABS
50.0000 mg | ORAL_TABLET | Freq: Every day | ORAL | Status: DC
  Administered 2017-06-13 – 2017-06-14 (×2): 50 mg via ORAL
  Filled 2017-06-13 (×2): qty 1

## 2017-06-13 MED ORDER — LORAZEPAM 2 MG/ML PO CONC
2.0000 mg | Freq: Three times a day (TID) | ORAL | Status: DC
  Administered 2017-06-13 – 2017-06-22 (×19): 2 mg
  Filled 2017-06-13 (×21): qty 1

## 2017-06-13 NOTE — Progress Notes (Signed)
  FPTS Interim Progress Note  Patient currently intubated, has failed SBTs. Appreciate PCCMs excellent care of patient, happy to resume care once appropriate for floor.   Lucila Maine, DO PGY-2, [facility] 06/13/2017 8:23 AM intern pager [phone]

## 2017-06-13 NOTE — Progress Notes (Signed)
Follow up - Critical Care Medicine Note  Patient Details:    Timothy Le is an 57 y.o. male.  Admitted initially to the IMTS for worsening dyspnea.  Initially started on BiPAP, but became progressively obtunded and hypercapnic with copious secretions. He was intubated and admitted to the intensive care unit.  He  has a past medical history of Asthma, COPD (chronic obstructive pulmonary disease) ([place]), Diabetes mellitus without complication ([place]), Hypertension, Shortness of breath dyspnea, and Sleep apnea.  He is not compliant with his CPAP.  Lines, Airways, Drains: Airway 6.5 mm (Active)  Secured at (cm) 27 cm 06/10/2017 11:17 AM  Measured From Lips 06/10/2017 11:17 AM  Secured Location Left 06/10/2017 11:17 AM  Secured By [employer] 06/10/2017 11:17 AM  Tube Holder Repositioned Yes 06/10/2017 11:17 AM  Cuff Pressure (cm H2O) 40 cm H2O 06/10/2017  8:32 AM  Site Condition Dry 06/10/2017 11:17 AM     NG/OG Tube Orogastric Center mouth Xray Measured external length of tube (Active)  Ongoing Placement Verification No change in cm markings or external length of tube from initial placement 06/10/2017  8:00 AM  Status Clamped 06/10/2017  8:00 AM     Urethral Catheter Forestine Na, RN Latex;Double-lumen;Straight-tip 16 Fr. (Active)  Indication for Insertion or Continuance of Catheter Unstable critical patients (first 24-48 hours);Aggressive IV diuresis 06/10/2017  8:00 AM  Site Assessment Clean;Intact 06/10/2017  8:00 AM  Catheter Maintenance Bag below level of bladder;Catheter secured;Insertion date on drainage bag;Drainage bag/tubing not touching floor;No dependent loops;Seal intact 06/10/2017  8:00 AM  Collection Container Standard drainage bag 06/10/2017  8:00 AM  Securement Method Leg strap 06/10/2017  8:00 AM  Urinary Catheter Interventions Unclamped 06/10/2017  8:00 AM  Output (mL) 1100 mL 06/10/2017 10:00 AM    Anti-infectives:  Anti-infectives (From admission, onward)   Start      Dose/Rate Route Frequency Ordered Stop   06/10/17 2200  levofloxacin (LEVAQUIN) IVPB 750 mg  Status:  Discontinued     750 mg 100 mL/hr over 90 Minutes Intravenous Every 24 hours 06/10/17 1031 06/11/17 1535   06/09/17 2230  levofloxacin (LEVAQUIN) IVPB 750 mg  Status:  Discontinued     750 mg 100 mL/hr over 90 Minutes Intravenous Every 24 hours 06/09/17 2120 06/10/17 0104      Microbiology: Results for orders placed or performed during the hospital encounter of 06/09/17  MRSA PCR Screening     Status: Abnormal   Collection Time: 06/10/17  2:01 AM  Result Value Ref Range Status   MRSA by PCR POSITIVE (A) NEGATIVE Final    Comment:        The GeneXpert MRSA Assay (FDA approved for NASAL specimens only), is one component of a comprehensive MRSA colonization surveillance program. It is not intended to diagnose MRSA infection nor to guide or monitor treatment for MRSA infections. RESULT CALLED TO, READ BACK BY AND VERIFIED WITH: T HOOD,RN AT 0850 06/10/17 BY L BENFIELD Performed at [facility], [place] [address] [place], [place] [address]    Laboratory: BMET    Component Value Date/Time   NA 140 06/13/2017 0506   K 3.1 (L) 06/13/2017 0506   CL 89 (L) 06/13/2017 0506   CO2 35 (H) 06/13/2017 0506   GLUCOSE 84 06/13/2017 0506   BUN 34 (H) 06/13/2017 0506   CREATININE 2.02 (H) 06/13/2017 0506   CALCIUM 9.1 06/13/2017 0506   GFRNONAA 35 (L) 06/13/2017 0506   GFRAA 41 (L) 06/13/2017 9983  CBC    Component Value Date/Time   WBC 5.8 06/13/2017 0506   RBC 4.59 06/13/2017 0506   HGB 11.8 (L) 06/13/2017 0506   HCT 41.9 06/13/2017 0506   PLT 200 06/13/2017 0506   MCV 91.3 06/13/2017 0506   MCH 25.7 (L) 06/13/2017 0506   MCHC 28.2 (L) 06/13/2017 0506   RDW 18.5 (H) 06/13/2017 0506   LYMPHSABS 1.1 06/13/2017 0506   MONOABS 0.6 06/13/2017 0506   EOSABS 0.1 06/13/2017 0506   BASOSABS 0.1 06/13/2017 0506   ABG    Component Value Date/Time   PHART 7.592 (H) 06/10/2017  0530   PCO2ART 53.8 (H) 06/10/2017 0530   PO2ART 70.4 (L) 06/10/2017 0530   HCO3 52.1 (H) 06/10/2017 0530   TCO2 >50 (H) 06/10/2017 0235   O2SAT 97.2 06/10/2017 0530    Best Practice/Protocols:  VTE Prophylaxis: Lovenox (prophylaxtic dose) Continous Sedation ARDS  Events:  Admitted from IMTS this am as Rapid Response.  Intubated for hypercapneic respiratory failure refractory to BiPAP.  Diuresed and started on steroids.   Studies: Dg Chest Port 1 View  Result Date: 06/10/2017 CLINICAL DATA:  Respiratory distress EXAM: PORTABLE CHEST 1 VIEW COMPARISON:  06/09/2017, 01/20/2017 FINDINGS: Endotracheal tube tip is about 3.4 cm superior to the carina. Esophageal tube tip is below the diaphragm but not included. Cardiomegaly with vascular congestion and mild perihilar edema. No pleural effusion. No pneumothorax. IMPRESSION: 1. Endotracheal tube tip about 3.4 cm superior to the carina 2. Cardiomegaly with vascular congestion and mild pulmonary edema Electronically Signed   By: Donavan Foil M.D.   On: 06/10/2017 01:38   Dg Chest Portable 1 View  Result Date: 06/09/2017 CLINICAL DATA:  Shortness of breath, COPD, asthma, diabetes mellitus, hypertension, sleep apnea, former smoker EXAM: PORTABLE CHEST 1 VIEW COMPARISON:  Portable exam 1315 hours compared to 01/20/2017 FINDINGS: Enlargement of cardiac silhouette. Prominent mediastinum light which may related to technique. Pulmonary vascularity normal. Low lung volumes with bibasilar atelectasis. Upper lungs clear. No pleural effusion or pneumothorax. IMPRESSION: Enlargement of cardiac silhouette. Low lung volumes with bibasilar atelectasis. Electronically Signed   By: Lavonia Dana M.D.   On: 06/09/2017 13:24   Dg Abd Portable 1v  Result Date: 06/10/2017 CLINICAL DATA:  OG tube placement EXAM: PORTABLE ABDOMEN - 1 VIEW COMPARISON:  03/27/2015 FINDINGS: Esophageal tube tip overlies the gastric body. Multiple dilated small and large bowel loops with  small bowel measuring up to 3.5 cm. IMPRESSION: 1. Esophageal tube tip overlies the gastric body 2. Multiple loops of dilated small and large bowel, ileus versus partial obstruction. Electronically Signed   By: Donavan Foil M.D.   On: 06/10/2017 01:40    Consults: Treatment Team:  Pccm, Md, MD   Subjective:    Overnight Issues:  - 8 L since admission. Wakes up promptly on sedation pause. Period of high airway pressure. Improved spontaneously.  Objective:  Vital signs for last 24 hours: Temp:  [97.5 F (36.4 C)-98.7 F (37.1 C)] 98.2 F (36.8 C) (05/26 2026) Pulse Rate:  [50-81] 54 (05/26 2100) Resp:  [14-20] 15 (05/26 2100) BP: (83-109)/(61-87) 103/70 (05/26 2100) SpO2:  [95 %-100 %] 96 % (05/26 2100) FiO2 (%):  [40 %] 40 % (05/26 2058) Weight:  [352 lb 11.8 oz (160 kg)] 352 lb 11.8 oz (160 kg) (05/26 0500)  Hemodynamic parameters for last 24 hours:  Hypertensive on no vasoactive infusions.  Intake/Output from previous day: 05/25 0701 - 05/26 0700 In: 1794.3 [I.V.:1744.3; IV Piggyback:50] Out: 5462 [Urine:3305;  Emesis/NG output:250]  Intake/Output this shift: Total I/O In: 234 [I.V.:154; NG/GT:80] Out: -   Vent settings for last 24 hours: Vent Mode: PRVC FiO2 (%):  [40 %] 40 % Set Rate:  [15 bmp] 15 bmp Vt Set:  [510 mL] 510 mL PEEP:  [8 cmH20-10 cmH20] 8 cmH20 Plateau Pressure:  [21 ASN05-39 cmH20] 21 cmH20  Physical Exam:  General: no respiratory distress and off propofol and fentanyl for wake up assessment.  Will become progressively agitated off sedation.   Neuro: nonfocal exam HEENT/Neck: JVD mild Resp: clear to auscultation bilaterally CVS: regular rate and rhythm, S1, S2 normal, no murmur, click, rub or gallop GI: soft, nontender, BS WNL, no r/g Skin: no rash Extremities: no edema, no erythema, pulses WNL, edema 3+ and venous stasis changes.  Assessment/Plan:   NEURO  Altered Mental Status:  encephalopathy   Plan: Sedation vacation daily.  Will start  oral sedatives to decrease infusion requirements.  PULM  Acute Respiratory Failure (due to increased CO2 production and due to volume overload without CHF), Chronic Respiratory Failure and Hypercarbia   Plan: Very small tube. Potential difficult airway. Limited improvement overall in spite of diuresis. Will plan for tracheostomy.  CARDIO  Congestive Heart Failure (diastolic) Cardiac function likely exacerbated by hypoxia and hypercarbia   Plan: Switch to oral maintenance diuretics.   RENAL  Metabolic Alkalosis (severe and posthypercapneic metabolic alkalosis) Chronic Kidney Disease: Stage 3 (GFR 30-59) Hypokalemia. Creatinine rising so so slow diuretics. Clinically volume improved.   Plan: Oral torsemide  GI  Nutritional Deficiency low nutritional risk.   Plan: Initiate feeds  ID  Purulent Bronchitis suspected but no leukocytosis and no infiltrate on CXR.   Plan: Stop antibiotics.  HEME  Anemia anemia of chronic disease and anemia of critical illness)   Plan: no transfusion indications  ENDO Diabetes Mellitus (Type 2) currently euglycemic   Plan: Continue basal Lantus and correction scale  Global Issues   Chronic respiratory failure may make weaning difficult.   LOS: 4 days   Additional comments:None  Critical Care Total Time*: 30 Minutes  Ludie Hudon 06/13/2017   ADDENDUM:  Attempted SBT but patient repeatedly triggered apnea alarm.  We will dexmedetomidine and intermittent hydromorphone and allow prior sedatives to washout to improve respiratory drive.  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  *Care during the described time interval was provided by me and/or other providers on the critical care team.  I have reviewed this patient's available data, including medical history, events of note, physical examination and test results as part of my evaluation.

## 2017-06-14 LAB — CBC WITH DIFFERENTIAL/PLATELET
ABS IMMATURE GRANULOCYTES: 0 10*3/uL (ref 0.0–0.1)
Basophils Absolute: 0 10*3/uL (ref 0.0–0.1)
Basophils Relative: 0 %
EOS PCT: 3 %
Eosinophils Absolute: 0.1 10*3/uL (ref 0.0–0.7)
HCT: 38.3 % — ABNORMAL LOW (ref 39.0–52.0)
Hemoglobin: 11 g/dL — ABNORMAL LOW (ref 13.0–17.0)
Immature Granulocytes: 1 %
LYMPHS ABS: 1.5 10*3/uL (ref 0.7–4.0)
LYMPHS PCT: 31 %
MCH: 26.3 pg (ref 26.0–34.0)
MCHC: 28.7 g/dL — ABNORMAL LOW (ref 30.0–36.0)
MCV: 91.6 fL (ref 78.0–100.0)
MONOS PCT: 13 %
Monocytes Absolute: 0.7 10*3/uL (ref 0.1–1.0)
Neutro Abs: 2.6 10*3/uL (ref 1.7–7.7)
Neutrophils Relative %: 52 %
PLATELETS: 197 10*3/uL (ref 150–400)
RBC: 4.18 MIL/uL — ABNORMAL LOW (ref 4.22–5.81)
RDW: 18.2 % — ABNORMAL HIGH (ref 11.5–15.5)
WBC: 4.9 10*3/uL (ref 4.0–10.5)

## 2017-06-14 LAB — GLUCOSE, CAPILLARY
GLUCOSE-CAPILLARY: 112 mg/dL — AB (ref 65–99)
GLUCOSE-CAPILLARY: 123 mg/dL — AB (ref 65–99)
Glucose-Capillary: 102 mg/dL — ABNORMAL HIGH (ref 65–99)
Glucose-Capillary: 108 mg/dL — ABNORMAL HIGH (ref 65–99)
Glucose-Capillary: 116 mg/dL — ABNORMAL HIGH (ref 65–99)
Glucose-Capillary: 117 mg/dL — ABNORMAL HIGH (ref 65–99)
Glucose-Capillary: 128 mg/dL — ABNORMAL HIGH (ref 65–99)

## 2017-06-14 LAB — BASIC METABOLIC PANEL
Anion gap: 12 (ref 5–15)
BUN: 37 mg/dL — AB (ref 6–20)
CHLORIDE: 94 mmol/L — AB (ref 101–111)
CO2: 33 mmol/L — ABNORMAL HIGH (ref 22–32)
Calcium: 8.8 mg/dL — ABNORMAL LOW (ref 8.9–10.3)
Creatinine, Ser: 1.9 mg/dL — ABNORMAL HIGH (ref 0.61–1.24)
GFR calc Af Amer: 44 mL/min — ABNORMAL LOW (ref >60)
GFR calc non Af Amer: 38 mL/min — ABNORMAL LOW (ref >60)
GLUCOSE: 102 mg/dL — AB (ref 65–99)
POTASSIUM: 3.5 mmol/L (ref 3.5–5.1)
Sodium: 139 mmol/L (ref 135–145)

## 2017-06-14 LAB — MAGNESIUM: MAGNESIUM: 2.1 mg/dL (ref 1.7–2.4)

## 2017-06-14 LAB — PHOSPHORUS: Phosphorus: 3.8 mg/dL (ref 2.5–4.6)

## 2017-06-14 LAB — TRIGLYCERIDES: Triglycerides: 238 mg/dL — ABNORMAL HIGH (ref <150)

## 2017-06-14 MED ORDER — VITAL HIGH PROTEIN PO LIQD
1000.0000 mL | ORAL | Status: DC
  Administered 2017-06-14 – 2017-07-07 (×15): 1000 mL
  Filled 2017-06-14: qty 1000

## 2017-06-14 MED ORDER — LORAZEPAM 2 MG/ML IJ SOLN
2.0000 mg | INTRAMUSCULAR | Status: DC | PRN
  Administered 2017-06-14 – 2017-06-23 (×13): 2 mg via INTRAVENOUS
  Filled 2017-06-14 (×13): qty 1

## 2017-06-14 MED ORDER — PRO-STAT SUGAR FREE PO LIQD
60.0000 mL | Freq: Two times a day (BID) | ORAL | Status: DC
  Administered 2017-06-14 – 2017-06-15 (×2): 60 mL
  Administered 2017-06-15: 30 mL
  Administered 2017-06-16 – 2017-06-19 (×6): 60 mL
  Administered 2017-06-20: 30 mL
  Administered 2017-06-21 – 2017-07-07 (×27): 60 mL
  Filled 2017-06-14 (×37): qty 60

## 2017-06-14 NOTE — Progress Notes (Signed)
Nutrition Follow-up  DOCUMENTATION CODES:   Morbid obesity  INTERVENTION:    Vital High Protein at 50 ml/h (1200 ml per day) via OG tube  Pro-stat 60 ml BID  Provides 1600 kcal, 165 gm protein, 1003 ml free water daily  NUTRITION DIAGNOSIS:   Inadequate oral intake related to inability to eat as evidenced by NPO status. Ongoing.   GOAL:   Provide needs based on ASPEN/SCCM guidelines Progressing.   MONITOR:   Vent status, Labs, I & O's  REASON FOR ASSESSMENT:   Consult Enteral/tube feeding initiation and management  ASSESSMENT:   57 yo male with PMH of morbid obesity, asthma, HTN, sleep apnea, COPD, and DM who was admitted on 5/22 with acute respiratory failure, COPD exacerbation, likely new onset HF. Required intubation shortly after admission.  OGT in place. Patient is currently intubated on ventilator support Temp (24hrs), Avg:98 F (36.7 C), Min:97.6 F (36.4 C), Max:98.7 F (37.1 C)   Labs reviewed.  CBG's: 117-102-128 Medications reviewed and include Novolog, Lantus.   Diet Order:   Diet Order           Diet NPO time specified  Diet effective now          EDUCATION NEEDS:   No education needs have been identified at this time  Skin:  Skin Assessment: Reviewed RN Assessment  Last BM:  5/23  Height:   Ht Readings from Last 1 Encounters:  06/10/17 5\' 6"  (1.676 m)    Weight:   Wt Readings from Last 1 Encounters:  06/14/17 (!) 356 lb 11.3 oz (161.8 kg)    Ideal Body Weight:  64.5 kg  BMI:  Body mass index is 57.57 kg/m.  Estimated Nutritional Needs:   Kcal:  6389-3734  Protein:  161 gm  Fluid:  2 L  [place], LDN, CNSC [phone] Pager [phone] After Hours Pager

## 2017-06-14 NOTE — Progress Notes (Signed)
Follow up - Critical Care Medicine Note  Patient Details:    Timothy Le is an 57 y.o. male.  Admitted initially to the IMTS for worsening dyspnea.  Initially started on BiPAP, but became progressively obtunded and hypercapnic with copious secretions. He was intubated and admitted to the intensive care unit.  He  has a past medical history of Asthma, COPD (chronic obstructive pulmonary disease) ([place]), Diabetes mellitus without complication ([place]), Hypertension, Shortness of breath dyspnea, and Sleep apnea.  He is not compliant with his CPAP.  On further conversation with his sister and niece, the patient has been experiencing a functional decline over the last year since he regained a lot of weight.  He spends most of his time sitting or bedbound and is hardly been ambulatory.  He apparently has a pattern of seesaw weight gain depending on his compliance with prescribed therapy.  He has been largely noncompliant with his CPAP for the last year and only wears his nasal cannula oxygen which he feels is more comfortable.  Lines, Airways, Drains: Airway 6.5 mm (Active)  Secured at (cm) 27 cm 06/10/2017 11:17 AM  Measured From Lips 06/10/2017 11:17 AM  Secured Location Left 06/10/2017 11:17 AM  Secured By [employer] 06/10/2017 11:17 AM  Tube Holder Repositioned Yes 06/10/2017 11:17 AM  Cuff Pressure (cm H2O) 40 cm H2O 06/10/2017  8:32 AM  Site Condition Dry 06/10/2017 11:17 AM     NG/OG Tube Orogastric Center mouth Xray Measured external length of tube (Active)  Ongoing Placement Verification No change in cm markings or external length of tube from initial placement 06/10/2017  8:00 AM  Status Clamped 06/10/2017  8:00 AM     Urethral Catheter Forestine Na, RN Latex;Double-lumen;Straight-tip 16 Fr. (Active)  Indication for Insertion or Continuance of Catheter Unstable critical patients (first 24-48 hours);Aggressive IV diuresis 06/10/2017  8:00 AM  Site Assessment Clean;Intact 06/10/2017  8:00  AM  Catheter Maintenance Bag below level of bladder;Catheter secured;Insertion date on drainage bag;Drainage bag/tubing not touching floor;No dependent loops;Seal intact 06/10/2017  8:00 AM  Collection Container Standard drainage bag 06/10/2017  8:00 AM  Securement Method Leg strap 06/10/2017  8:00 AM  Urinary Catheter Interventions Unclamped 06/10/2017  8:00 AM  Output (mL) 1100 mL 06/10/2017 10:00 AM    Anti-infectives:  Anti-infectives (From admission, onward)   Start     Dose/Rate Route Frequency Ordered Stop   06/10/17 2200  levofloxacin (LEVAQUIN) IVPB 750 mg  Status:  Discontinued     750 mg 100 mL/hr over 90 Minutes Intravenous Every 24 hours 06/10/17 1031 06/11/17 1535   06/09/17 2230  levofloxacin (LEVAQUIN) IVPB 750 mg  Status:  Discontinued     750 mg 100 mL/hr over 90 Minutes Intravenous Every 24 hours 06/09/17 2120 06/10/17 0104      Microbiology: Results for orders placed or performed during the hospital encounter of 06/09/17  MRSA PCR Screening     Status: Abnormal   Collection Time: 06/10/17  2:01 AM  Result Value Ref Range Status   MRSA by PCR POSITIVE (A) NEGATIVE Final    Comment:        The GeneXpert MRSA Assay (FDA approved for NASAL specimens only), is one component of a comprehensive MRSA colonization surveillance program. It is not intended to diagnose MRSA infection nor to guide or monitor treatment for MRSA infections. RESULT CALLED TO, READ BACK BY AND VERIFIED WITH: T HOOD,RN AT 0850 06/10/17 BY L BENFIELD Performed at [facility], [address] [place], [place] [address]    Laboratory: BMET    Component Value Date/Time   NA 139 06/14/2017 0321   K 3.5 06/14/2017 0321   CL 94 (L) 06/14/2017 0321   CO2 33 (H) 06/14/2017 0321   GLUCOSE 102 (H) 06/14/2017 0321   BUN 37 (H) 06/14/2017 0321   CREATININE 1.90 (H) 06/14/2017 0321   CALCIUM 8.8 (L) 06/14/2017 0321   GFRNONAA 38 (L) 06/14/2017 0321   GFRAA 44 (L) 06/14/2017 0321   CBC     Component Value Date/Time   WBC 4.9 06/14/2017 0321   RBC 4.18 (L) 06/14/2017 0321   HGB 11.0 (L) 06/14/2017 0321   HCT 38.3 (L) 06/14/2017 0321   PLT 197 06/14/2017 0321   MCV 91.6 06/14/2017 0321   MCH 26.3 06/14/2017 0321   MCHC 28.7 (L) 06/14/2017 0321   RDW 18.2 (H) 06/14/2017 0321   LYMPHSABS 1.5 06/14/2017 0321   MONOABS 0.7 06/14/2017 0321   EOSABS 0.1 06/14/2017 0321   BASOSABS 0.0 06/14/2017 0321   ABG    Component Value Date/Time   PHART 7.592 (H) 06/10/2017 0530   PCO2ART 53.8 (H) 06/10/2017 0530   PO2ART 70.4 (L) 06/10/2017 0530   HCO3 52.1 (H) 06/10/2017 0530   TCO2 >50 (H) 06/10/2017 0235   O2SAT 97.2 06/10/2017 0530    Best Practice/Protocols:  VTE Prophylaxis: Lovenox (prophylaxtic dose) Continous Sedation ARDS  Events:  Admitted from IMTS this am as Rapid Response.  Intubated for hypercapneic respiratory failure refractory to BiPAP.  Diuresed and started on steroids.   Studies: Dg Chest Port 1 View  Result Date: 06/10/2017 CLINICAL DATA:  Respiratory distress EXAM: PORTABLE CHEST 1 VIEW COMPARISON:  06/09/2017, 01/20/2017 FINDINGS: Endotracheal tube tip is about 3.4 cm superior to the carina. Esophageal tube tip is below the diaphragm but not included. Cardiomegaly with vascular congestion and mild perihilar edema. No pleural effusion. No pneumothorax. IMPRESSION: 1. Endotracheal tube tip about 3.4 cm superior to the carina 2. Cardiomegaly with vascular congestion and mild pulmonary edema Electronically Signed   By: Donavan Foil M.D.   On: 06/10/2017 01:38   Dg Chest Portable 1 View  Result Date: 06/09/2017 CLINICAL DATA:  Shortness of breath, COPD, asthma, diabetes mellitus, hypertension, sleep apnea, former smoker EXAM: PORTABLE CHEST 1 VIEW COMPARISON:  Portable exam 1315 hours compared to 01/20/2017 FINDINGS: Enlargement of cardiac silhouette. Prominent mediastinum light which may related to technique. Pulmonary vascularity normal. Low lung volumes  with bibasilar atelectasis. Upper lungs clear. No pleural effusion or pneumothorax. IMPRESSION: Enlargement of cardiac silhouette. Low lung volumes with bibasilar atelectasis. Electronically Signed   By: Lavonia Dana M.D.   On: 06/09/2017 13:24   Dg Abd Portable 1v  Result Date: 06/10/2017 CLINICAL DATA:  OG tube placement EXAM: PORTABLE ABDOMEN - 1 VIEW COMPARISON:  03/27/2015 FINDINGS: Esophageal tube tip overlies the gastric body. Multiple dilated small and large bowel loops with small bowel measuring up to 3.5 cm. IMPRESSION: 1. Esophageal tube tip overlies the gastric body 2. Multiple loops of dilated small and large bowel, ileus versus partial obstruction. Electronically Signed   By: Donavan Foil M.D.   On: 06/10/2017 01:40    Consults: Treatment Team:  Pccm, Md, MD   Subjective:    Overnight Issues:  - 8 L since admission. Wakes up promptly on sedation pause. Period of high airway pressure. Improved spontaneously.  Objective:  Vital signs for last 24 hours: Temp:  [97.5 F (36.4 C)-98.2 F (36.8 C)] 97.5  F (36.4 C) (05/27 1124) Pulse Rate:  [46-76] 53 (05/27 1300) Resp:  [14-20] 15 (05/27 1300) BP: (88-104)/(52-71) 88/59 (05/27 1300) SpO2:  [95 %-100 %] 99 % (05/27 1300) FiO2 (%):  [40 %] 40 % (05/27 1516) Weight:  [356 lb 11.3 oz (161.8 kg)] 356 lb 11.3 oz (161.8 kg) (05/27 0453)  Hemodynamic parameters for last 24 hours:  Hypertensive on no vasoactive infusions.  Intake/Output from previous day: 05/26 0701 - 05/27 0700 In: 2874.2 [I.V.:1944.2; NG/GT:830; IV Piggyback:100] Out: 1460 [Urine:1310; Emesis/NG output:150]  Intake/Output this shift: Total I/O In: 752 [I.V.:462; NG/GT:240; IV Piggyback:50] Out: 90 [Urine:90]  Vent settings for last 24 hours: Vent Mode: PRVC FiO2 (%):  [40 %] 40 % Set Rate:  [15 bmp] 15 bmp Vt Set:  [510 mL] 510 mL PEEP:  [8 cmH20] 8 cmH20 Plateau Pressure:  [21 TKZ60-10 cmH20] 24 cmH20  Physical Exam:  General: no respiratory  distress and off propofol and fentanyl for wake up assessment.  Will become progressively agitated off sedation.   Neuro: nonfocal exam HEENT/Neck: JVD mild Resp: clear to auscultation bilaterally CVS: regular rate and rhythm, S1, S2 normal, no murmur, click, rub or gallop GI: soft, nontender, BS WNL, no r/g Skin: no rash Extremities: no edema, no erythema, pulses WNL, edema 3+ and venous stasis changes.  Assessment/Plan:   NEURO  Altered Mental Status:  encephalopathy requiring large doses of sedatives. 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  Actively wean IV sedation off now that orals have been started.  Continue as needed intermittent IV sedation if needed for breakthrough.  PULM  Acute Respiratory Failure (due to increased CO2 production and due to volume overload without CHF), Chronic Respiratory Failure and Hypercarbia   Plan: Very small tube. Potential difficult airway. Limited improvement overall in spite of diuresis. Will plan for tracheostomy following discussion with sister and niece as detailed below  CARDIO  Congestive Heart Failure (diastolic) Cardiac function likely exacerbated by hypoxia and hypercarbia   Plan: Switch to oral maintenance diuretics.   RENAL  Metabolic Alkalosis (severe and posthypercapneic metabolic alkalosis) Chronic Kidney Disease: Stage 3 (GFR 30-59) Hypokalemia. Creatinine has leveled off with decreased diuresis. clinically volume status has improved.   Plan: Oral torsemide as maintenance diuretic  GI  Nutritional Deficiency low nutritional risk.   Plan: Feeds initiated  ID  Purulent Bronchitis suspected but no leukocytosis and no infiltrate on CXR.   Plan: Antibiotics stopped.  HEME  Anemia anemia of chronic disease and anemia of critical illness)   Plan: no transfusion indications  ENDO Diabetes Mellitus (Type 2) currently euglycemic   Plan: Continue basal Lantus and correction  scale  Global Issues   Chronic respiratory failure may make weaning difficult.  Spoke to patient's next of kin, his sister and niece.  While the patient has previously expressed a desire not to have a tracheostomy he is also expressed to his sister a conflicting desire to be kept alive.  It is in the spirit of the latter that she has decided to accept a tracheostomy for this patient.  Thus the recommended plan would be to proceed directly to tracheostomy as a trial of extubation would be risky in a patient with such a difficult airway and would likely not address his long-term airway obstruction.   LOS: 5 days   Additional comments:None  Critical Care Total Time*: 40 Minutes  Rachyl Wuebker 06/14/2017    *Care during  the described time interval was provided by me and/or other providers on the critical care team.  I have reviewed this patient's available data, including medical history, events of note, physical examination and test results as part of my evaluation.

## 2017-06-14 NOTE — Progress Notes (Signed)
Follow up - Critical Care Medicine Note  Patient Details:    Timothy Le is an 57 y.o. male.  Admitted initially to the IMTS for worsening dyspnea.  Initially started on BiPAP, but became progressively obtunded and hypercapnic with copious secretions. He was intubated and admitted to the intensive care unit.  He  has a past medical history of Asthma, COPD (chronic obstructive pulmonary disease) ([place]), Diabetes mellitus without complication ([place]), Hypertension, Shortness of breath dyspnea, and Sleep apnea.  He is not compliant with his CPAP.  On further conversation with his sister and niece, the patient has been experiencing a functional decline over the last year since he regained a lot of weight.  He spends most of his time sitting or bedbound and is hardly been ambulatory.  He apparently has a pattern of seesaw weight gain depending on his compliance with prescribed therapy.  He has been largely noncompliant with his CPAP for the last year and only wears his nasal cannula oxygen which he feels is more comfortable.  Lines, Airways, Drains: Airway 6.5 mm (Active)  Secured at (cm) 27 cm 06/10/2017 11:17 AM  Measured From Lips 06/10/2017 11:17 AM  Secured Location Left 06/10/2017 11:17 AM  Secured By [employer] 06/10/2017 11:17 AM  Tube Holder Repositioned Yes 06/10/2017 11:17 AM  Cuff Pressure (cm H2O) 40 cm H2O 06/10/2017  8:32 AM  Site Condition Dry 06/10/2017 11:17 AM     NG/OG Tube Orogastric Center mouth Xray Measured external length of tube (Active)  Ongoing Placement Verification No change in cm markings or external length of tube from initial placement 06/10/2017  8:00 AM  Status Clamped 06/10/2017  8:00 AM     Urethral Catheter Forestine Na, RN Latex;Double-lumen;Straight-tip 16 Fr. (Active)  Indication for Insertion or Continuance of Catheter Unstable critical patients (first 24-48 hours);Aggressive IV diuresis 06/10/2017  8:00 AM  Site Assessment Clean;Intact 06/10/2017  8:00  AM  Catheter Maintenance Bag below level of bladder;Catheter secured;Insertion date on drainage bag;Drainage bag/tubing not touching floor;No dependent loops;Seal intact 06/10/2017  8:00 AM  Collection Container Standard drainage bag 06/10/2017  8:00 AM  Securement Method Leg strap 06/10/2017  8:00 AM  Urinary Catheter Interventions Unclamped 06/10/2017  8:00 AM  Output (mL) 1100 mL 06/10/2017 10:00 AM    Anti-infectives:  Anti-infectives (From admission, onward)   Start     Dose/Rate Route Frequency Ordered Stop   06/10/17 2200  levofloxacin (LEVAQUIN) IVPB 750 mg  Status:  Discontinued     750 mg 100 mL/hr over 90 Minutes Intravenous Every 24 hours 06/10/17 1031 06/11/17 1535   06/09/17 2230  levofloxacin (LEVAQUIN) IVPB 750 mg  Status:  Discontinued     750 mg 100 mL/hr over 90 Minutes Intravenous Every 24 hours 06/09/17 2120 06/10/17 0104      Microbiology: Results for orders placed or performed during the hospital encounter of 06/09/17  MRSA PCR Screening     Status: Abnormal   Collection Time: 06/10/17  2:01 AM  Result Value Ref Range Status   MRSA by PCR POSITIVE (A) NEGATIVE Final    Comment:        The GeneXpert MRSA Assay (FDA approved for NASAL specimens only), is one component of a comprehensive MRSA colonization surveillance program. It is not intended to diagnose MRSA infection nor to guide or monitor treatment for MRSA infections. RESULT CALLED TO, READ BACK BY AND VERIFIED WITH: T HOOD,RN AT 0850 06/10/17 BY L BENFIELD Performed at [facility], [address] [place], [place] [address]    Laboratory: BMET    Component Value Date/Time   NA 139 06/14/2017 0321   K 3.5 06/14/2017 0321   CL 94 (L) 06/14/2017 0321   CO2 33 (H) 06/14/2017 0321   GLUCOSE 102 (H) 06/14/2017 0321   BUN 37 (H) 06/14/2017 0321   CREATININE 1.90 (H) 06/14/2017 0321   CALCIUM 8.8 (L) 06/14/2017 0321   GFRNONAA 38 (L) 06/14/2017 0321   GFRAA 44 (L) 06/14/2017 0321   CBC     Component Value Date/Time   WBC 4.9 06/14/2017 0321   RBC 4.18 (L) 06/14/2017 0321   HGB 11.0 (L) 06/14/2017 0321   HCT 38.3 (L) 06/14/2017 0321   PLT 197 06/14/2017 0321   MCV 91.6 06/14/2017 0321   MCH 26.3 06/14/2017 0321   MCHC 28.7 (L) 06/14/2017 0321   RDW 18.2 (H) 06/14/2017 0321   LYMPHSABS 1.5 06/14/2017 0321   MONOABS 0.7 06/14/2017 0321   EOSABS 0.1 06/14/2017 0321   BASOSABS 0.0 06/14/2017 0321   ABG    Component Value Date/Time   PHART 7.592 (H) 06/10/2017 0530   PCO2ART 53.8 (H) 06/10/2017 0530   PO2ART 70.4 (L) 06/10/2017 0530   HCO3 52.1 (H) 06/10/2017 0530   TCO2 >50 (H) 06/10/2017 0235   O2SAT 97.2 06/10/2017 0530    Best Practice/Protocols:  VTE Prophylaxis: Lovenox (prophylaxtic dose) Continous Sedation ARDS  Events:  Admitted from IMTS this am as Rapid Response.  Intubated for hypercapneic respiratory failure refractory to BiPAP.  Diuresed and started on steroids.   Studies: Dg Chest Port 1 View  Result Date: 06/10/2017 CLINICAL DATA:  Respiratory distress EXAM: PORTABLE CHEST 1 VIEW COMPARISON:  06/09/2017, 01/20/2017 FINDINGS: Endotracheal tube tip is about 3.4 cm superior to the carina. Esophageal tube tip is below the diaphragm but not included. Cardiomegaly with vascular congestion and mild perihilar edema. No pleural effusion. No pneumothorax. IMPRESSION: 1. Endotracheal tube tip about 3.4 cm superior to the carina 2. Cardiomegaly with vascular congestion and mild pulmonary edema Electronically Signed   By: Donavan Foil M.D.   On: 06/10/2017 01:38   Dg Chest Portable 1 View  Result Date: 06/09/2017 CLINICAL DATA:  Shortness of breath, COPD, asthma, diabetes mellitus, hypertension, sleep apnea, former smoker EXAM: PORTABLE CHEST 1 VIEW COMPARISON:  Portable exam 1315 hours compared to 01/20/2017 FINDINGS: Enlargement of cardiac silhouette. Prominent mediastinum light which may related to technique. Pulmonary vascularity normal. Low lung volumes  with bibasilar atelectasis. Upper lungs clear. No pleural effusion or pneumothorax. IMPRESSION: Enlargement of cardiac silhouette. Low lung volumes with bibasilar atelectasis. Electronically Signed   By: Lavonia Dana M.D.   On: 06/09/2017 13:24   Dg Abd Portable 1v  Result Date: 06/10/2017 CLINICAL DATA:  OG tube placement EXAM: PORTABLE ABDOMEN - 1 VIEW COMPARISON:  03/27/2015 FINDINGS: Esophageal tube tip overlies the gastric body. Multiple dilated small and large bowel loops with small bowel measuring up to 3.5 cm. IMPRESSION: 1. Esophageal tube tip overlies the gastric body 2. Multiple loops of dilated small and large bowel, ileus versus partial obstruction. Electronically Signed   By: Donavan Foil M.D.   On: 06/10/2017 01:40    Consults: Treatment Team:  Pccm, Md, MD   Subjective:    Overnight Issues:  - 8 L since admission. Wakes up promptly on sedation pause. Period of high airway pressure. Improved spontaneously.  Objective:  Vital signs for last 24 hours: Temp:  [97.5 F (36.4 C)-98.7 F (37.1 C)] 97.5  F (36.4 C) (05/27 1124) Pulse Rate:  [46-76] 53 (05/27 1300) Resp:  [14-20] 15 (05/27 1300) BP: (88-104)/(52-71) 88/59 (05/27 1300) SpO2:  [95 %-100 %] 99 % (05/27 1300) FiO2 (%):  [40 %] 40 % (05/27 1423) Weight:  [356 lb 11.3 oz (161.8 kg)] 356 lb 11.3 oz (161.8 kg) (05/27 0453)  Hemodynamic parameters for last 24 hours:  Hypertensive on no vasoactive infusions.  Intake/Output from previous day: 05/26 0701 - 05/27 0700 In: 2874.2 [I.V.:1944.2; NG/GT:830; IV Piggyback:100] Out: 1460 [Urine:1310; Emesis/NG output:150]  Intake/Output this shift: Total I/O In: 752 [I.V.:462; NG/GT:240; IV Piggyback:50] Out: 90 [Urine:90]  Vent settings for last 24 hours: Vent Mode: PRVC FiO2 (%):  [40 %] 40 % Set Rate:  [15 bmp] 15 bmp Vt Set:  [510 mL] 510 mL PEEP:  [8 cmH20] 8 cmH20 Plateau Pressure:  [21 cmH20-22 cmH20] 22 cmH20  Physical Exam:  General: no respiratory  distress and off propofol and fentanyl for wake up assessment.  Will become progressively agitated off sedation.   Neuro: nonfocal exam HEENT/Neck: JVD mild Resp: clear to auscultation bilaterally CVS: regular rate and rhythm, S1, S2 normal, no murmur, click, rub or gallop GI: soft, nontender, BS WNL, no r/g Skin: no rash Extremities: no edema, no erythema, pulses WNL, edema 3+ and venous stasis changes.  Assessment/Plan:   NEURO  Altered Mental Status:  encephalopathy requiring large doses of sedatives. 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  Actively wean IV sedation off now that orals have been started.  Continue as needed intermittent IV sedation if needed for breakthrough.  PULM  Acute Respiratory Failure (due to increased CO2 production and due to volume overload without CHF), Chronic Respiratory Failure and Hypercarbia   Plan: Very small tube. Potential difficult airway. Limited improvement overall in spite of diuresis. Will plan for tracheostomy following discussion with sister and niece as detailed below  CARDIO  Congestive Heart Failure (diastolic) Cardiac function likely exacerbated by hypoxia and hypercarbia   Plan: Switch to oral maintenance diuretics.   RENAL  Metabolic Alkalosis (severe and posthypercapneic metabolic alkalosis) Chronic Kidney Disease: Stage 3 (GFR 30-59) Hypokalemia. Creatinine has leveled off with decreased diuresis. clinically volume status has improved.   Plan: Oral torsemide as maintenance diuretic  GI  Nutritional Deficiency low nutritional risk.   Plan: Feeds initiated  ID  Purulent Bronchitis suspected but no leukocytosis and no infiltrate on CXR.   Plan: Antibiotics stopped.  HEME  Anemia anemia of chronic disease and anemia of critical illness)   Plan: no transfusion indications  ENDO Diabetes Mellitus (Type 2) currently euglycemic   Plan: Continue basal Lantus and correction  scale  Global Issues   Chronic respiratory failure may make weaning difficult.  Spoke to patient's next of kin, his sister and niece.  While the patient has previously expressed a desire not to have a tracheostomy he is also expressed to his sister a conflicting desire to be kept alive.  It is in the spirit of the latter that she has decided to accept a tracheostomy for this patient.  Thus the recommended plan would be to proceed directly to tracheostomy as a trial of extubation would be risky in a patient with such a difficult airway and would likely not address his long-term airway obstruction.   LOS: 5 days   Additional comments:None  Critical Care Total Time*: 40 Minutes  Aylinn Rydberg 06/14/2017    *Care during  the described time interval was provided by me and/or other providers on the critical care team.  I have reviewed this patient's available data, including medical history, events of note, physical examination and test results as part of my evaluation.

## 2017-06-14 NOTE — Progress Notes (Signed)
  FPTS Interim Progress Note  Patient currently intubated. Appreciate PCCMs excellent care of patient, happy to resume care once appropriate for floor.   Of note, on admission, patient made it clear to admitting provider he did NOT want a tracheostomy. Sisters at bedside who were present for this conversation.  Lucila Maine, DO PGY-2, [facility] 06/14/2017 8:35 AM intern pager [phone]

## 2017-06-14 NOTE — Progress Notes (Signed)
Sister and niece were called by this RN at this time asking them to come in today to speak with the MD about the plan of care. Niece reports that pt stated on admission to MD that he does not want tracheostomy. Niece stated to this RN that she and her mother (pt's sister) would be in today to discuss future care plan.

## 2017-06-15 ENCOUNTER — Inpatient Hospital Stay (HOSPITAL_COMMUNITY): Payer: Medicaid Other

## 2017-06-15 ENCOUNTER — Inpatient Hospital Stay: Payer: Self-pay

## 2017-06-15 DIAGNOSIS — J9602 Acute respiratory failure with hypercapnia: Secondary | ICD-10-CM

## 2017-06-15 DIAGNOSIS — G4733 Obstructive sleep apnea (adult) (pediatric): Secondary | ICD-10-CM

## 2017-06-15 DIAGNOSIS — J441 Chronic obstructive pulmonary disease with (acute) exacerbation: Secondary | ICD-10-CM

## 2017-06-15 DIAGNOSIS — J9691 Respiratory failure, unspecified with hypoxia: Secondary | ICD-10-CM

## 2017-06-15 LAB — CBC WITH DIFFERENTIAL/PLATELET
Abs Immature Granulocytes: 0.1 10*3/uL (ref 0.0–0.1)
BASOS PCT: 0 %
Basophils Absolute: 0 10*3/uL (ref 0.0–0.1)
EOS ABS: 0.2 10*3/uL (ref 0.0–0.7)
EOS PCT: 3 %
HCT: 37.3 % — ABNORMAL LOW (ref 39.0–52.0)
Hemoglobin: 11 g/dL — ABNORMAL LOW (ref 13.0–17.0)
IMMATURE GRANULOCYTES: 1 %
Lymphocytes Relative: 14 %
Lymphs Abs: 0.8 10*3/uL (ref 0.7–4.0)
MCH: 26.2 pg (ref 26.0–34.0)
MCHC: 29.5 g/dL — AB (ref 30.0–36.0)
MCV: 88.8 fL (ref 78.0–100.0)
MONO ABS: 0.7 10*3/uL (ref 0.1–1.0)
Monocytes Relative: 12 %
NEUTROS PCT: 70 %
Neutro Abs: 4.3 10*3/uL (ref 1.7–7.7)
PLATELETS: 204 10*3/uL (ref 150–400)
RBC: 4.2 MIL/uL — ABNORMAL LOW (ref 4.22–5.81)
RDW: 18 % — AB (ref 11.5–15.5)
WBC: 6.1 10*3/uL (ref 4.0–10.5)

## 2017-06-15 LAB — GLUCOSE, CAPILLARY
GLUCOSE-CAPILLARY: 118 mg/dL — AB (ref 65–99)
Glucose-Capillary: 116 mg/dL — ABNORMAL HIGH (ref 65–99)
Glucose-Capillary: 110 mg/dL — ABNORMAL HIGH (ref 65–99)
Glucose-Capillary: 132 mg/dL — ABNORMAL HIGH (ref 65–99)
Glucose-Capillary: 132 mg/dL — ABNORMAL HIGH (ref 65–99)

## 2017-06-15 LAB — BASIC METABOLIC PANEL
Anion gap: 11 (ref 5–15)
BUN: 34 mg/dL — AB (ref 6–20)
CALCIUM: 8.8 mg/dL — AB (ref 8.9–10.3)
CO2: 27 mmol/L (ref 22–32)
CREATININE: 1.51 mg/dL — AB (ref 0.61–1.24)
Chloride: 101 mmol/L (ref 101–111)
GFR calc Af Amer: 58 mL/min — ABNORMAL LOW (ref >60)
GFR, EST NON AFRICAN AMERICAN: 50 mL/min — AB (ref >60)
GLUCOSE: 127 mg/dL — AB (ref 65–99)
Potassium: 4.3 mmol/L (ref 3.5–5.1)
Sodium: 139 mmol/L (ref 135–145)

## 2017-06-15 MED ORDER — SODIUM CHLORIDE 0.9% FLUSH
10.0000 mL | INTRAVENOUS | Status: DC | PRN
  Administered 2017-06-27 – 2017-07-06 (×2): 10 mL
  Filled 2017-06-15 (×2): qty 40

## 2017-06-15 MED ORDER — QUETIAPINE FUMARATE 100 MG PO TABS
100.0000 mg | ORAL_TABLET | Freq: Two times a day (BID) | ORAL | Status: DC
  Administered 2017-06-15 – 2017-06-21 (×12): 100 mg via ORAL
  Filled 2017-06-15 (×15): qty 1

## 2017-06-15 MED ORDER — CHLORHEXIDINE GLUCONATE CLOTH 2 % EX PADS
6.0000 | MEDICATED_PAD | Freq: Every day | CUTANEOUS | Status: DC
  Administered 2017-06-16: 6 via TOPICAL

## 2017-06-15 MED ORDER — DOCUSATE SODIUM 50 MG/5ML PO LIQD
100.0000 mg | Freq: Two times a day (BID) | ORAL | Status: DC
  Administered 2017-06-15 – 2017-07-15 (×45): 100 mg
  Filled 2017-06-15 (×47): qty 10

## 2017-06-15 NOTE — Progress Notes (Signed)
  57 year old morbidly obese man with severe OSA, noncompliant with CPAP therapy, admitted 5/22 with acute hypercarbic respiratory failure. Intubated on 5/23  He remains intubated, current problems being extreme agitation whenever sedation is being lowered, is currently sedated on fentanyl and propofol. He has been diuresed and is -6 L.   On exam deeply sedated, RA SS -5-on propofol and fentanyl, green urine in Foley, morbidly obese, 6.5 ET tube with 26 cm at lip, decreased breath sounds bilateral, soft and nontender, 1+ edema.  Labs reviewed which show improving creatinine to 1.5, no leukocytosis, normal electrolytes  Chest x-ray from 5/23 shows cardiomegaly with edema pattern.  Impression/plan Acute hypercarbic respiratory failure-has been unable to wean due to extreme agitation and concern for difficult airway. Based on discussion with sister and niece, will plan for tracheostomy by ENT Underlying obesity hypoventilation and severe OSA  Acute encephalopathy continue propofol and fentanyl, add-Seroquel 100 twice daily with QTC tolerance, discontinue Dilaudid and use as needed only, continue to aim for RASS -4 until permanent airway obtained.  CKD 3 -continue maintenance torsemide.  Constipation/morbid obesity-continue tube feeds, add standing order for Colace and Dulcolax  We will obtain PICC line.  My critical care time x 69m  Kara Mead MD. FCCP. [facility] Pager [phone] If no response call [phone]   06/15/2017

## 2017-06-15 NOTE — Progress Notes (Signed)
Peripherally Inserted Central Catheter/Midline Placement  The IV Nurse has discussed with the patient and/or persons authorized to consent for the patient, the purpose of this procedure and the potential benefits and risks involved with this procedure.  The benefits include less needle sticks, lab draws from the catheter, and the patient may be discharged home with the catheter. Risks include, but not limited to, infection, bleeding, blood clot (thrombus formation), and puncture of an artery; nerve damage and irregular heartbeat and possibility to perform a PICC exchange if needed/ordered by physician.  Alternatives to this procedure were also discussed.  Bard Power PICC patient education guide, fact sheet on infection prevention and patient information card has been provided to patient /or left at bedside.    PICC/Midline Placement Documentation  PICC Triple Lumen 06/15/17 PICC Right Cephalic 38 cm 0 cm (Active)  Indication for Insertion or Continuance of Line Prolonged intravenous therapies 06/15/2017  1:48 PM  Exposed Catheter (cm) 0 cm 06/15/2017  1:48 PM  Site Assessment Clean;Dry;Intact 06/15/2017  1:48 PM  Lumen #1 Status Flushed;Blood return noted;Saline locked 06/15/2017  1:48 PM  Lumen #2 Status Flushed;Blood return noted;Saline locked 06/15/2017  1:48 PM  Lumen #3 Status Flushed;Blood return noted;Saline locked 06/15/2017  1:48 PM  Dressing Type Transparent 06/15/2017  1:48 PM  Dressing Status Clean;Dry;Intact 06/15/2017  1:48 PM  Dressing Change Due 06/22/17 06/15/2017  1:48 PM       [address]ty [address] 06/15/2017, 1:50 PM

## 2017-06-15 NOTE — Consult Note (Addendum)
Reason for Consult: Chronic respiratory failure Referring Physician: Rigoberto Noel, MD  HPI:  Timothy Le is an 57 y.o. male with history of morbid obesity, severe OSA, admitted on 5/22 with acute hypercarbic respiratory failure. He was intubated on 5/23. He remains intubated today, current problems being extreme agitation whenever sedation is being lowered. He is currently sedated on fentanyl and propofol. As a result he could not be weaned from the ventilator. ENT consulted for possible trach.  Past Medical History:  Diagnosis Date  . Asthma   . COPD (chronic obstructive pulmonary disease) ([place])   . Diabetes mellitus without complication ([place])   . Hypertension   . Shortness of breath dyspnea   . Sleep apnea     History reviewed. No pertinent surgical history.  Family History  Problem Relation Age of Onset  . Asthma Sister   . Asthma Other        nephew  . Hypertension Sister   . Diabetes type II Sister     Social History:  reports that he quit smoking about 4 years ago. His smoking use included cigarettes. He started smoking about 33 years ago. He has a 0.25 pack-year smoking history. He has never used smokeless tobacco. He reports that he does not drink alcohol or use drugs.  Allergies: No Known Allergies  Prior to Admission medications   Medication Sig Start Date End Date Taking? Authorizing Provider  amLODipine (NORVASC) 10 MG tablet Take 10 mg by mouth daily.   Yes [provider]  atorvastatin (LIPITOR) 10 MG tablet Take 10 mg by mouth daily.   Yes [provider]  furosemide (LASIX) 40 MG tablet Take 1 tablet (40 mg total) by mouth 2 (two) times daily. 10/10/16  Yes Virgel Manifold, MD  gemfibrozil (LOPID) 600 MG tablet Take 600 mg by mouth 2 (two) times daily before a meal. Thirty minutes before morning and evening meal.   Yes [provider]  guaiFENesin (MUCINEX) 600 MG 12 hr tablet Take 1 tablet (600 mg total) by mouth 2 (two) times  daily. 06/21/16  Yes Barton Dubois, MD  insulin glargine (LANTUS) 100 UNIT/ML injection Inject 0.6 mLs (60 Units total) into the skin daily. 04/12/15  Yes Geradine Girt, DO  lisinopril-hydrochlorothiazide (PRINZIDE,ZESTORETIC) 20-25 MG tablet Take 1 tablet by mouth daily.   Yes [provider]  loratadine (CLARITIN) 10 MG tablet 1 daily for allergy while needed Patient taking differently: Take 10 mg by mouth daily as needed for allergies.  11/25/16  Yes Young, Tarri Fuller D, MD  metFORMIN (GLUCOPHAGE) 500 MG tablet Take 1,000 mg by mouth 2 (two) times daily with a meal.   Yes [provider]  blood glucose meter kit and supplies Dispense based on patient and insurance preference. Use up to four times daily as directed. (FOR ICD-9 250.00, 250.01). 03/29/15   [name], MD    Medications:  I have reviewed the patient's current medications. Scheduled: . chlorhexidine gluconate (MEDLINE KIT)  15 mL Mouth Rinse BID  . Chlorhexidine Gluconate Cloth  6 each Topical Daily  . docusate  100 mg Per Tube BID  . enoxaparin (LOVENOX) injection  40 mg Subcutaneous Q24H  . feeding supplement (PRO-STAT SUGAR FREE 64)  60 mL Per Tube BID  . insulin aspart  3-9 Units Subcutaneous Q4H  . insulin glargine  10 Units Subcutaneous QHS  . ipratropium-albuterol  3 mL Nebulization Q6H  . LORazepam  2 mg Per Tube Q8H  . mouth rinse  15 mL Mouth Rinse 10 times per day  . QUEtiapine  100 mg Oral BID  . sodium chloride flush  3 mL Intravenous Q12H  . torsemide  10 mg Oral Daily   Continuous: . sodium chloride 250 mL (06/15/17 1730)  . famotidine (PEPCID) IV Stopped (06/15/17 1201)  . feeding supplement (VITAL HIGH PROTEIN) 50 mL/hr at 06/15/17 1800  . fentaNYL infusion INTRAVENOUS 375 mcg/hr (06/15/17 1800)  . propofol (DIPRIVAN) infusion 15 mcg/kg/min (06/15/17 1800)    Results for orders placed or performed during the hospital encounter of 06/09/17 (from the past 48 hour(s))  Glucose, capillary      Status: None   Collection Time: 06/13/17  8:24 PM  Result Value Ref Range   Glucose-Capillary 97 65 - 99 mg/dL   Comment 1 Notify RN   Glucose, capillary     Status: Abnormal   Collection Time: 06/14/17 12:12 AM  Result Value Ref Range   Glucose-Capillary 117 (H) 65 - 99 mg/dL   Comment 1 Notify RN   Basic metabolic panel     Status: Abnormal   Collection Time: 06/14/17  3:21 AM  Result Value Ref Range   Sodium 139 135 - 145 mmol/L   Potassium 3.5 3.5 - 5.1 mmol/L   Chloride 94 (L) 101 - 111 mmol/L   CO2 33 (H) 22 - 32 mmol/L   Glucose, Bld 102 (H) 65 - 99 mg/dL   BUN 37 (H) 6 - 20 mg/dL   Creatinine, Ser 1.90 (H) 0.61 - 1.24 mg/dL   Calcium 8.8 (L) 8.9 - 10.3 mg/dL   GFR calc non Af Amer 38 (L) >60 mL/min   GFR calc Af Amer 44 (L) >60 mL/min    Comment: (NOTE) The eGFR has been calculated using the CKD EPI equation. This calculation has not been validated in all clinical situations. eGFR's persistently <60 mL/min signify possible Chronic Kidney Disease.    Anion gap 12 5 - 15    Comment: Performed at [facility] [address], [place], [place] [address]  CBC with Differential/Platelet     Status: Abnormal   Collection Time: 06/14/17  3:21 AM  Result Value Ref Range   WBC 4.9 4.0 - 10.5 K/uL   RBC 4.18 (L) 4.22 - 5.81 MIL/uL   Hemoglobin 11.0 (L) 13.0 - 17.0 g/dL   HCT 38.3 (L) 39.0 - 52.0 %   MCV 91.6 78.0 - 100.0 fL   MCH 26.3 26.0 - 34.0 pg   MCHC 28.7 (L) 30.0 - 36.0 g/dL   RDW 18.2 (H) 11.5 - 15.5 %   Platelets 197 150 - 400 K/uL   Neutrophils Relative % 52 %   Neutro Abs 2.6 1.7 - 7.7 K/uL   Lymphocytes Relative 31 %   Lymphs Abs 1.5 0.7 - 4.0 K/uL   Monocytes Relative 13 %   Monocytes Absolute 0.7 0.1 - 1.0 K/uL   Eosinophils Relative 3 %   Eosinophils Absolute 0.1 0.0 - 0.7 K/uL   Basophils Relative 0 %   Basophils Absolute 0.0 0.0 - 0.1 K/uL   Immature Granulocytes 1 %   Abs Immature Granulocytes 0.0 0.0 - 0.1 K/uL    Comment: Performed at  [facility], [address], [place], [place] [address]  Magnesium     Status: None   Collection Time: 06/14/17  3:21 AM  Result Value Ref Range   Magnesium 2.1 1.7 - 2.4 mg/dL    Comment: Performed at [facility]  Hospital Lab, [place] [address] [place], [place] [address]  Phosphorus     Status: None   Collection Time: 06/14/17  3:21 AM  Result Value Ref Range   Phosphorus 3.8 2.5 - 4.6 mg/dL    Comment: Performed at [facility] [address] [place], [place] [address]  Triglycerides     Status: Abnormal   Collection Time: 06/14/17  3:21 AM  Result Value Ref Range   Triglycerides 238 (H) <150 mg/dL    Comment: Performed at [facility] [address] [place], [place] [address]  Glucose, capillary     Status: Abnormal   Collection Time: 06/14/17  4:06 AM  Result Value Ref Range   Glucose-Capillary 102 (H) 65 - 99 mg/dL   Comment 1 Notify RN   Glucose, capillary     Status: Abnormal   Collection Time: 06/14/17  8:02 AM  Result Value Ref Range   Glucose-Capillary 128 (H) 65 - 99 mg/dL   Comment 1 Capillary Specimen   Glucose, capillary     Status: Abnormal   Collection Time: 06/14/17 11:21 AM  Result Value Ref Range   Glucose-Capillary 108 (H) 65 - 99 mg/dL   Comment 1 Capillary Specimen    Comment 2 Notify RN   Glucose, capillary     Status: Abnormal   Collection Time: 06/14/17  3:40 PM  Result Value Ref Range   Glucose-Capillary 112 (H) 65 - 99 mg/dL   Comment 1 Capillary Specimen    Comment 2 Notify RN   Glucose, capillary     Status: Abnormal   Collection Time: 06/14/17  8:01 PM  Result Value Ref Range   Glucose-Capillary 123 (H) 65 - 99 mg/dL   Comment 1 Capillary Specimen    Comment 2 Notify RN   Glucose, capillary     Status: Abnormal   Collection Time: 06/14/17 11:39 PM  Result Value Ref Range   Glucose-Capillary 116 (H) 65 - 99 mg/dL   Comment 1 Capillary Specimen    Comment 2 Notify RN   Glucose, capillary     Status: Abnormal   Collection  Time: 06/15/17  3:43 AM  Result Value Ref Range   Glucose-Capillary 116 (H) 65 - 99 mg/dL   Comment 1 Capillary Specimen    Comment 2 Notify RN   CBC with Differential/Platelet     Status: Abnormal   Collection Time: 06/15/17  4:47 AM  Result Value Ref Range   WBC 6.1 4.0 - 10.5 K/uL   RBC 4.20 (L) 4.22 - 5.81 MIL/uL   Hemoglobin 11.0 (L) 13.0 - 17.0 g/dL   HCT 37.3 (L) 39.0 - 52.0 %   MCV 88.8 78.0 - 100.0 fL   MCH 26.2 26.0 - 34.0 pg   MCHC 29.5 (L) 30.0 - 36.0 g/dL   RDW 18.0 (H) 11.5 - 15.5 %   Platelets 204 150 - 400 K/uL   Neutrophils Relative % 70 %   Neutro Abs 4.3 1.7 - 7.7 K/uL   Lymphocytes Relative 14 %   Lymphs Abs 0.8 0.7 - 4.0 K/uL   Monocytes Relative 12 %   Monocytes Absolute 0.7 0.1 - 1.0 K/uL   Eosinophils Relative 3 %   Eosinophils Absolute 0.2 0.0 - 0.7 K/uL   Basophils Relative 0 %   Basophils Absolute 0.0 0.0 - 0.1 K/uL   Immature Granulocytes 1 %   Abs Immature Granulocytes 0.1 0.0 - 0.1 K/uL    Comment: Performed at [facility]  Hospital Lab, [place] [address] [place], [place] [address]  Basic metabolic panel     Status: Abnormal   Collection Time: 06/15/17  4:47 AM  Result Value Ref Range   Sodium 139 135 - 145 mmol/L   Potassium 4.3 3.5 - 5.1 mmol/L   Chloride 101 101 - 111 mmol/L   CO2 27 22 - 32 mmol/L   Glucose, Bld 127 (H) 65 - 99 mg/dL   BUN 34 (H) 6 - 20 mg/dL   Creatinine, Ser 1.51 (H) 0.61 - 1.24 mg/dL   Calcium 8.8 (L) 8.9 - 10.3 mg/dL   GFR calc non Af Amer 50 (L) >60 mL/min   GFR calc Af Amer 58 (L) >60 mL/min    Comment: (NOTE) The eGFR has been calculated using the CKD EPI equation. This calculation has not been validated in all clinical situations. eGFR's persistently <60 mL/min signify possible Chronic Kidney Disease.    Anion gap 11 5 - 15    Comment: Performed at [facility] [address] [place], [place] [address]  Glucose, capillary     Status: Abnormal   Collection Time: 06/15/17  8:26 AM  Result Value Ref Range    Glucose-Capillary 110 (H) 65 - 99 mg/dL   Comment 1 Notify RN   Glucose, capillary     Status: Abnormal   Collection Time: 06/15/17 12:20 PM  Result Value Ref Range   Glucose-Capillary 132 (H) 65 - 99 mg/dL   Comment 1 Notify RN   Glucose, capillary     Status: Abnormal   Collection Time: 06/15/17  4:38 PM  Result Value Ref Range   Glucose-Capillary 118 (H) 65 - 99 mg/dL   Comment 1 Notify RN     Dg Chest Port 1 View  Result Date: 06/15/2017 CLINICAL DATA:  Respiratory distress EXAM: PORTABLE CHEST 1 VIEW COMPARISON:  06/10/2017 FINDINGS: Endotracheal tube and NG tube are unchanged. Cardiomegaly with vascular congestion. Bibasilar atelectasis. No overt edema. No visible effusions or acute bony abnormality. IMPRESSION: Cardiomegaly with vascular congestion and bibasilar atelectasis. Electronically Signed   By: [name] M.D.   On: 06/15/2017 11:21   [place] Ekg Site Rite  Result Date: 06/15/2017 If Site Rite image not attached, placement could not be confirmed due to current cardiac rhythm.  Review of Systems  Could not be obtained. Pt is intubated and sedated.   Blood pressure 125/79, pulse 79, temperature 98.4 F (36.9 C), temperature source Oral, resp. rate 15, height '5\' 6"'  (1.676 m), weight (!) 359 lb 2.1 oz (162.9 kg), SpO2 96 %. Exam: General: morbidly obese male, intubated, sedated Eyes: PERRL, clear conjunctiva, EOM could not be tested. Ears: Normal auricles and EACs. Nose: Normal mucosa, septum, and turbinates. Mouth: Normal mucosa. No lesion. Neck: Severely obese. Laryngeal framework not palpable. Respiratory: On vent.  Assessment/Plan: Pt with morbid obesity, severe OSA, now with acute on chronic respiratory failure. Pt could not be weaned from his ventilator support. Plan tracheostomy in the OR, on Thursday morning at 8:30 AM.  Tyrann Donaho W Alese Furniss 06/15/2017, 6:19 PM

## 2017-06-16 ENCOUNTER — Inpatient Hospital Stay (HOSPITAL_COMMUNITY): Payer: Medicaid Other

## 2017-06-16 DIAGNOSIS — J9691 Respiratory failure, unspecified with hypoxia: Secondary | ICD-10-CM

## 2017-06-16 DIAGNOSIS — J441 Chronic obstructive pulmonary disease with (acute) exacerbation: Secondary | ICD-10-CM

## 2017-06-16 DIAGNOSIS — G4733 Obstructive sleep apnea (adult) (pediatric): Secondary | ICD-10-CM

## 2017-06-16 LAB — CBC WITH DIFFERENTIAL/PLATELET
Abs Immature Granulocytes: 0 10*3/uL (ref 0.0–0.1)
Basophils Absolute: 0 10*3/uL (ref 0.0–0.1)
Basophils Relative: 0 %
EOS ABS: 0.2 10*3/uL (ref 0.0–0.7)
Eosinophils Relative: 2 %
HEMATOCRIT: 36 % — AB (ref 39.0–52.0)
HEMOGLOBIN: 10.1 g/dL — AB (ref 13.0–17.0)
Immature Granulocytes: 1 %
LYMPHS ABS: 0.9 10*3/uL (ref 0.7–4.0)
LYMPHS PCT: 11 %
MCH: 25.6 pg — AB (ref 26.0–34.0)
MCHC: 28.1 g/dL — AB (ref 30.0–36.0)
MCV: 91.4 fL (ref 78.0–100.0)
MONO ABS: 0.8 10*3/uL (ref 0.1–1.0)
MONOS PCT: 11 %
Neutro Abs: 6.1 10*3/uL (ref 1.7–7.7)
Neutrophils Relative %: 75 %
Platelets: 212 10*3/uL (ref 150–400)
RBC: 3.94 MIL/uL — AB (ref 4.22–5.81)
RDW: 17.5 % — AB (ref 11.5–15.5)
WBC: 8 10*3/uL (ref 4.0–10.5)

## 2017-06-16 LAB — BASIC METABOLIC PANEL
Anion gap: 10 (ref 5–15)
BUN: 31 mg/dL — AB (ref 6–20)
CALCIUM: 9 mg/dL (ref 8.9–10.3)
CHLORIDE: 100 mmol/L — AB (ref 101–111)
CO2: 32 mmol/L (ref 22–32)
Creatinine, Ser: 1.2 mg/dL (ref 0.61–1.24)
GFR calc non Af Amer: >60 mL/min (ref >60)
GLUCOSE: 137 mg/dL — AB (ref 65–99)
Potassium: 3.6 mmol/L (ref 3.5–5.1)
Sodium: 142 mmol/L (ref 135–145)

## 2017-06-16 LAB — GLUCOSE, CAPILLARY
GLUCOSE-CAPILLARY: 142 mg/dL — AB (ref 65–99)
GLUCOSE-CAPILLARY: 130 mg/dL — AB (ref 65–99)
GLUCOSE-CAPILLARY: 146 mg/dL — AB (ref 65–99)
GLUCOSE-CAPILLARY: 122 mg/dL — AB (ref 65–99)
Glucose-Capillary: 117 mg/dL — ABNORMAL HIGH (ref 65–99)
Glucose-Capillary: 128 mg/dL — ABNORMAL HIGH (ref 65–99)

## 2017-06-16 LAB — MAGNESIUM: Magnesium: 2 mg/dL (ref 1.7–2.4)

## 2017-06-16 LAB — PHOSPHORUS: PHOSPHORUS: 3.2 mg/dL (ref 2.5–4.6)

## 2017-06-16 MED ORDER — CHLORHEXIDINE GLUCONATE CLOTH 2 % EX PADS
6.0000 | MEDICATED_PAD | Freq: Every day | CUTANEOUS | Status: DC
  Administered 2017-06-17 – 2017-07-01 (×15): 6 via TOPICAL

## 2017-06-16 NOTE — Progress Notes (Signed)
Subjective: Still intubated and sedated. On vent.  Objective: Vital signs in last 24 hours: Temp:  [98.2 F (36.8 C)-99 F (37.2 C)] 98.9 F (37.2 C) (05/29 0011) Pulse Rate:  [70-100] 84 (05/29 0838) Resp:  [13-21] 13 (05/29 0838) BP: (96-127)/(61-85) 123/77 (05/29 0800) SpO2:  [87 %-100 %] 93 % (05/29 0840) FiO2 (%):  [40 %] 40 % (05/29 0840) Weight:  [357 lb 12.9 oz (162.3 kg)] 357 lb 12.9 oz (162.3 kg) (05/29 0438)  Exam: General:morbidly obese male, intubated, sedated Eyes:PERRL, clear conjunctiva, EOM could not be tested. Ears: Normal auricles and EACs. Nose: Normal mucosa, septum, and turbinates. Mouth: Normal mucosa. No lesion. Neck: Severely obese. Laryngeal framework not palpable. Respiratory:On vent.   Recent Labs    06/15/17 0447 06/16/17 0438  WBC 6.1 8.0  HGB 11.0* 10.1*  HCT 37.3* 36.0*  PLT 204 212   Recent Labs    06/15/17 0447 06/16/17 0438  NA 139 142  K 4.3 3.6  CL 101 100*  CO2 27 32  GLUCOSE 127* 137*  BUN 34* 31*  CREATININE 1.51* 1.20  CALCIUM 8.8* 9.0    Medications:  I have reviewed the patient's current medications. Scheduled: . chlorhexidine gluconate (MEDLINE KIT)  15 mL Mouth Rinse BID  . [START ON 06/17/2017] Chlorhexidine Gluconate Cloth  6 each Topical Daily  . docusate  100 mg Per Tube BID  . enoxaparin (LOVENOX) injection  40 mg Subcutaneous Q24H  . feeding supplement (PRO-STAT SUGAR FREE 64)  60 mL Per Tube BID  . insulin aspart  3-9 Units Subcutaneous Q4H  . insulin glargine  10 Units Subcutaneous QHS  . ipratropium-albuterol  3 mL Nebulization Q6H  . LORazepam  2 mg Per Tube Q8H  . mouth rinse  15 mL Mouth Rinse 10 times per day  . QUEtiapine  100 mg Oral BID  . sodium chloride flush  3 mL Intravenous Q12H  . torsemide  10 mg Oral Daily   Continuous: . sodium chloride 10 mL/hr at 06/16/17 0800  . famotidine (PEPCID) IV Stopped (06/15/17 2252)  . feeding supplement (VITAL HIGH PROTEIN) 50 mL/hr at 06/16/17  0800  . fentaNYL infusion INTRAVENOUS 190 mcg/hr (06/16/17 0845)  . propofol (DIPRIVAN) infusion 7 mcg/kg/min (06/16/17 0847)    Assessment/Plan: Morbid obesity, severe OSA, now with acute on chronic respiratory failure. Pt could not be weaned from his ventilator support.  - Discussed treatment options with the patient's sister [name]. - His sister agree with tracheostomy placement. Informed consent obtained. - Plan tracheostomy in the OR on Thursday morning at 8:30 AM. - Hold lovenox today. Hold tube feed at midnight.    LOS: 7 days   Timothy Le W Analy Bassford 06/16/2017, 9:14 AM

## 2017-06-16 NOTE — Progress Notes (Signed)
Wife Timothy Le [phone] at bedside.  States that she and pt have been separated for 15 years, not legally divorced.

## 2017-06-16 NOTE — Progress Notes (Signed)
57 year old morbidly obese man with severe OSA, noncompliant with CPAP therapy, admitted 5/22 with acute hypercarbic respiratory failure. Intubated on 5/23   He remains critically ill, intubated, sedated on fentanyl and propofol drip, RA SS -5, morbidly obese, decreased breath sounds bilateral, soft and nontender, 1+ edema. 6.5 ET tube  Chest x-ray does not show any new infiltrates. Labs show normal electrolytes mild low potassium, mild anemia and no leukocytosis.  Impression/plan  Acute hypercarbic respiratory failure -plan is for tracheostomy by ENT on 5/30 and hopefully will wean to trach collar afterwards. Remains to be seen whether he will need nocturnal ventilation. Underlying obesity hypoventilation and severe OSA with CPAP noncompliance.  Acute encephalopathy-continue propofol and fentanyl and maintain deep sedation under tracheostomy, continue Seroquel for now  CKD 3-continue home dose torsemide  Family has been updated and consent obtained for tracheostomy.  Critical care time x 31 m  Kara Mead MD. FCCP.  Pulmonary & Critical care Pager [phone] If no response call [phone]   06/16/2017

## 2017-06-16 NOTE — Anesthesia Preprocedure Evaluation (Signed)
Anesthesia Evaluation  Patient identified by MRN, date of birth, ID band  Reviewed: Allergy & Precautions, NPO status , Patient's Chart, lab work & pertinent test results  Airway Mallampati: Intubated       Dental   Pulmonary asthma , sleep apnea , COPD, former smoker,     + decreased breath sounds      Cardiovascular hypertension, Normal cardiovascular exam Rhythm:Regular Rate:Normal     Neuro/Psych    GI/Hepatic   Endo/Other  diabetesMorbid obesity  Renal/GU Renal InsufficiencyRenal disease     Musculoskeletal   Abdominal (+) + obese,   Peds  Hematology   Anesthesia Other Findings   Reproductive/Obstetrics                             Anesthesia Physical Anesthesia Plan  ASA: IV  Anesthesia Plan: General   Post-op Pain Management:    Induction:   PONV Risk Score and Plan:   Airway Management Planned:   Additional Equipment:   Intra-op Plan:   Post-operative Plan: Post-operative intubation/ventilation  Informed Consent: I have reviewed the patients History and Physical, chart, labs and discussed the procedure including the risks, benefits and alternatives for the proposed anesthesia with the patient or authorized representative who has indicated his/her understanding and acceptance.     Plan Discussed with: CRNA  Anesthesia Plan Comments:         Anesthesia Quick Evaluation

## 2017-06-17 ENCOUNTER — Inpatient Hospital Stay (HOSPITAL_COMMUNITY): Payer: Medicaid Other | Admitting: Anesthesiology

## 2017-06-17 ENCOUNTER — Inpatient Hospital Stay (HOSPITAL_COMMUNITY): Payer: Medicaid Other

## 2017-06-17 ENCOUNTER — Encounter (HOSPITAL_COMMUNITY)
Admission: EM | Disposition: A | Discharge status: Rehabilitation Facility | Payer: Self-pay | Source: Home / Self Care | Attending: Family Medicine

## 2017-06-17 ENCOUNTER — Encounter (HOSPITAL_COMMUNITY): Payer: Self-pay | Admitting: Anesthesiology

## 2017-06-17 DIAGNOSIS — G4733 Obstructive sleep apnea (adult) (pediatric): Secondary | ICD-10-CM

## 2017-06-17 DIAGNOSIS — J441 Chronic obstructive pulmonary disease with (acute) exacerbation: Secondary | ICD-10-CM

## 2017-06-17 DIAGNOSIS — J9691 Respiratory failure, unspecified with hypoxia: Secondary | ICD-10-CM

## 2017-06-17 HISTORY — PX: TRACHEOSTOMY TUBE PLACEMENT: SHX814

## 2017-06-17 LAB — CBC WITH DIFFERENTIAL/PLATELET
ABS IMMATURE GRANULOCYTES: 0.1 10*3/uL (ref 0.0–0.1)
BASOS PCT: 0 %
Basophils Absolute: 0 10*3/uL (ref 0.0–0.1)
Eosinophils Absolute: 0.2 10*3/uL (ref 0.0–0.7)
Eosinophils Relative: 3 %
HCT: 34.6 % — ABNORMAL LOW (ref 39.0–52.0)
Hemoglobin: 9.9 g/dL — ABNORMAL LOW (ref 13.0–17.0)
IMMATURE GRANULOCYTES: 1 %
LYMPHS ABS: 1.1 10*3/uL (ref 0.7–4.0)
Lymphocytes Relative: 13 %
MCH: 25.8 pg — ABNORMAL LOW (ref 26.0–34.0)
MCHC: 28.6 g/dL — ABNORMAL LOW (ref 30.0–36.0)
MCV: 90.3 fL (ref 78.0–100.0)
Monocytes Absolute: 1.2 10*3/uL — ABNORMAL HIGH (ref 0.1–1.0)
Monocytes Relative: 14 %
NEUTROS ABS: 5.8 10*3/uL (ref 1.7–7.7)
NEUTROS PCT: 69 %
PLATELETS: 227 10*3/uL (ref 150–400)
RBC: 3.83 MIL/uL — AB (ref 4.22–5.81)
RDW: 17.4 % — ABNORMAL HIGH (ref 11.5–15.5)
WBC: 8.4 10*3/uL (ref 4.0–10.5)

## 2017-06-17 LAB — GLUCOSE, CAPILLARY
GLUCOSE-CAPILLARY: 106 mg/dL — AB (ref 65–99)
GLUCOSE-CAPILLARY: 120 mg/dL — AB (ref 65–99)
GLUCOSE-CAPILLARY: 138 mg/dL — AB (ref 65–99)
GLUCOSE-CAPILLARY: 99 mg/dL (ref 65–99)
Glucose-Capillary: 108 mg/dL — ABNORMAL HIGH (ref 65–99)
Glucose-Capillary: 124 mg/dL — ABNORMAL HIGH (ref 65–99)

## 2017-06-17 LAB — BASIC METABOLIC PANEL
ANION GAP: 9 (ref 5–15)
BUN: 31 mg/dL — ABNORMAL HIGH (ref 6–20)
CALCIUM: 8.8 mg/dL — AB (ref 8.9–10.3)
CO2: 31 mmol/L (ref 22–32)
Chloride: 103 mmol/L (ref 101–111)
Creatinine, Ser: 1.29 mg/dL — ABNORMAL HIGH (ref 0.61–1.24)
GLUCOSE: 102 mg/dL — AB (ref 65–99)
POTASSIUM: 3.8 mmol/L (ref 3.5–5.1)
Sodium: 143 mmol/L (ref 135–145)

## 2017-06-17 LAB — TRIGLYCERIDES: TRIGLYCERIDES: 155 mg/dL — AB (ref <150)

## 2017-06-17 SURGERY — CREATION, TRACHEOSTOMY
Anesthesia: General | Site: Neck

## 2017-06-17 MED ORDER — ONDANSETRON HCL 4 MG/2ML IJ SOLN
INTRAMUSCULAR | Status: DC | PRN
  Administered 2017-06-17: 4 mg via INTRAVENOUS

## 2017-06-17 MED ORDER — ROCURONIUM BROMIDE 10 MG/ML (PF) SYRINGE
PREFILLED_SYRINGE | INTRAVENOUS | Status: AC
  Filled 2017-06-17: qty 5

## 2017-06-17 MED ORDER — LIDOCAINE-EPINEPHRINE 1 %-1:100000 IJ SOLN
INTRAMUSCULAR | Status: AC
  Filled 2017-06-17: qty 1

## 2017-06-17 MED ORDER — ONDANSETRON HCL 4 MG/2ML IJ SOLN
INTRAMUSCULAR | Status: AC
  Filled 2017-06-17: qty 2

## 2017-06-17 MED ORDER — FENTANYL CITRATE (PF) 100 MCG/2ML IJ SOLN
INTRAMUSCULAR | Status: DC | PRN
  Administered 2017-06-17 (×2): 50 ug via INTRAVENOUS

## 2017-06-17 MED ORDER — HEMOSTATIC AGENTS (NO CHARGE) OPTIME
TOPICAL | Status: DC | PRN
  Administered 2017-06-17: 1 via TOPICAL

## 2017-06-17 MED ORDER — CEFAZOLIN SODIUM 10 G IJ SOLR
3.0000 g | INTRAMUSCULAR | Status: AC
  Administered 2017-06-17: 3 g via INTRAVENOUS
  Filled 2017-06-17: qty 3000

## 2017-06-17 MED ORDER — MIDAZOLAM HCL 2 MG/2ML IJ SOLN
INTRAMUSCULAR | Status: AC
Start: 2017-06-17 — End: ?
  Filled 2017-06-17: qty 2

## 2017-06-17 MED ORDER — LACTATED RINGERS IV SOLN
INTRAVENOUS | Status: DC | PRN
  Administered 2017-06-17: 09:00:00 via INTRAVENOUS

## 2017-06-17 MED ORDER — MIDAZOLAM HCL 5 MG/5ML IJ SOLN
INTRAMUSCULAR | Status: DC | PRN
  Administered 2017-06-17 (×2): 1 mg via INTRAVENOUS

## 2017-06-17 MED ORDER — 0.9 % SODIUM CHLORIDE (POUR BTL) OPTIME
TOPICAL | Status: DC | PRN
  Administered 2017-06-17: 1000 mL

## 2017-06-17 MED ORDER — PHENYLEPHRINE 40 MCG/ML (10ML) SYRINGE FOR IV PUSH (FOR BLOOD PRESSURE SUPPORT)
PREFILLED_SYRINGE | INTRAVENOUS | Status: DC | PRN
  Administered 2017-06-17: 80 ug via INTRAVENOUS

## 2017-06-17 MED ORDER — PROPOFOL 10 MG/ML IV BOLUS
INTRAVENOUS | Status: AC
  Filled 2017-06-17: qty 20

## 2017-06-17 MED ORDER — PHENYLEPHRINE 40 MCG/ML (10ML) SYRINGE FOR IV PUSH (FOR BLOOD PRESSURE SUPPORT)
PREFILLED_SYRINGE | INTRAVENOUS | Status: AC
  Filled 2017-06-17: qty 10

## 2017-06-17 MED ORDER — FENTANYL CITRATE (PF) 250 MCG/5ML IJ SOLN
INTRAMUSCULAR | Status: AC
  Filled 2017-06-17: qty 5

## 2017-06-17 MED ORDER — LIDOCAINE-EPINEPHRINE 1 %-1:100000 IJ SOLN
INTRAMUSCULAR | Status: DC | PRN
  Administered 2017-06-17: 6 mL

## 2017-06-17 MED ORDER — ROCURONIUM BROMIDE 100 MG/10ML IV SOLN
INTRAVENOUS | Status: DC | PRN
  Administered 2017-06-17: 50 mg via INTRAVENOUS

## 2017-06-17 SURGICAL SUPPLY — 46 items
BLADE 10 SAFETY STRL DISP (BLADE) ×3 IMPLANT
BLADE 11 SAFETY STRL DISP (BLADE) IMPLANT
BLADE 15 SAFETY STRL DISP (BLADE) IMPLANT
BLADE CLIPPER SURG (BLADE) IMPLANT
BLADE SURG 15 STRL LF DISP TIS (BLADE) IMPLANT
BLADE SURG 15 STRL SS (BLADE)
CANISTER SUCT 3000ML PPV (MISCELLANEOUS) ×3 IMPLANT
CLEANER TIP ELECTROSURG 2X2 (MISCELLANEOUS) ×3 IMPLANT
COAGULATOR SUCT 8FR VV (MISCELLANEOUS) ×3 IMPLANT
COVER SURGICAL LIGHT HANDLE (MISCELLANEOUS) ×3 IMPLANT
DECANTER SPIKE VIAL GLASS SM (MISCELLANEOUS) ×3 IMPLANT
DRAPE HALF SHEET 40X57 (DRAPES) IMPLANT
ELECT COATED BLADE 2.86 ST (ELECTRODE) ×3 IMPLANT
ELECT REM PT RETURN 9FT ADLT (ELECTROSURGICAL) ×3
ELECTRODE REM PT RTRN 9FT ADLT (ELECTROSURGICAL) ×1 IMPLANT
GAUZE SPONGE 4X4 16PLY XRAY LF (GAUZE/BANDAGES/DRESSINGS) ×3 IMPLANT
GAUZE XEROFORM 5X9 LF (GAUZE/BANDAGES/DRESSINGS) IMPLANT
GLOVE ECLIPSE 7.5 STRL STRAW (GLOVE) ×3 IMPLANT
GOWN STRL REUS W/ TWL LRG LVL3 (GOWN DISPOSABLE) ×3 IMPLANT
GOWN STRL REUS W/TWL LRG LVL3 (GOWN DISPOSABLE) ×6
HEMOSTAT SNOW SURGICEL 2X4 (HEMOSTASIS) ×3 IMPLANT
HOLDER TRACH TUBE VELCRO 19.5 (MISCELLANEOUS) ×6 IMPLANT
KIT BASIN OR (CUSTOM PROCEDURE TRAY) ×3 IMPLANT
KIT SUCTION CATH 14FR (SUCTIONS) IMPLANT
KIT TURNOVER KIT B (KITS) ×3 IMPLANT
NEEDLE HYPO 25GX1X1/2 BEV (NEEDLE) ×3 IMPLANT
NS IRRIG 1000ML POUR BTL (IV SOLUTION) ×3 IMPLANT
PAD ARMBOARD 7.5X6 YLW CONV (MISCELLANEOUS) ×6 IMPLANT
PENCIL BUTTON HOLSTER BLD 10FT (ELECTRODE) IMPLANT
SPONGE DRAIN TRACH 4X4 STRL 2S (GAUZE/BANDAGES/DRESSINGS) ×3 IMPLANT
SPONGE INTESTINAL PEANUT (DISPOSABLE) ×3 IMPLANT
SUT CHROMIC 3 0 SH 27 (SUTURE) IMPLANT
SUT PROLENE 2 0 SH DA (SUTURE) ×3 IMPLANT
SUT SILK 3 0 (SUTURE) ×2
SUT SILK 3-0 18XBRD TIE 12 (SUTURE) ×1 IMPLANT
SUT VIC AB 2-0 SH 27 (SUTURE) ×2
SUT VIC AB 2-0 SH 27XBRD (SUTURE) ×1 IMPLANT
SYR 20CC LL (SYRINGE) IMPLANT
SYR BULB 3OZ (MISCELLANEOUS) IMPLANT
SYR CONTROL 10ML LL (SYRINGE) IMPLANT
TOWEL OR 17X24 6PK STRL BLUE (TOWEL DISPOSABLE) ×3 IMPLANT
TRAY ENT MC OR (CUSTOM PROCEDURE TRAY) ×3 IMPLANT
TUBE CONNECTING 12'X1/4 (SUCTIONS) ×1
TUBE CONNECTING 12X1/4 (SUCTIONS) ×2 IMPLANT
TUBE TRACH 7.0 EXL PROX CUF (TUBING) ×3 IMPLANT
WATER STERILE IRR 1000ML POUR (IV SOLUTION) ×3 IMPLANT

## 2017-06-17 NOTE — Anesthesia Procedure Notes (Signed)
Performed by: Kyung Rudd, CRNA Pre-anesthesia Checklist: Patient identified, Emergency Drugs available, Suction available and Patient being monitored Patient Re-evaluated:Patient Re-evaluated prior to induction Oxygen Delivery Method: Circle system utilized Preoxygenation: Pre-oxygenation with 100% oxygen Induction Type: Inhalational induction with existing ETT Placement Confirmation: positive ETCO2 and breath sounds checked- equal and bilateral Dental Injury: Teeth and Oropharynx as per pre-operative assessment

## 2017-06-17 NOTE — Anesthesia Postprocedure Evaluation (Signed)
Anesthesia Post Note  Patient: Timothy Le  Procedure(s) Performed: TRACHEOSTOMY (N/A Neck)     Patient location during evaluation: PACU Anesthesia Type: General Level of consciousness: awake and alert Pain management: pain level controlled Vital Signs Assessment: post-procedure vital signs reviewed and stable Respiratory status: respiratory function stable and patient connected to tracheostomy mask oxygen Cardiovascular status: blood pressure returned to baseline and stable Postop Assessment: no apparent nausea or vomiting Anesthetic complications: no    Last Vitals:  Vitals:   06/17/17 0800 06/17/17 1110  BP: 104/64   Pulse: 100   Resp: 15   Temp:    SpO2: 94% 92%    Last Pain:  Vitals:   06/17/17 0329  TempSrc: Oral  PainSc:                  Barnet Glasgow

## 2017-06-17 NOTE — Transfer of Care (Signed)
Immediate Anesthesia Transfer of Care Note  Patient: Timothy Le  Procedure(s) Performed: TRACHEOSTOMY (N/A Neck)  Patient Location: ICU  Anesthesia Type:General  Level of Consciousness: sedated  Airway & Oxygen Therapy: Patient placed on Ventilator (see vital sign flow sheet for setting)  Post-op Assessment: Report given to RN and Post -op Vital signs reviewed and stable  Post vital signs: Reviewed and stable  Last Vitals:  Vitals Value Taken Time  BP    Temp    Pulse    Resp    SpO2      Last Pain:  Vitals:   06/17/17 0329  TempSrc: Oral  PainSc:          Complications: No apparent anesthesia complications

## 2017-06-17 NOTE — Op Note (Signed)
DATE OF PROCEDURE:  06/17/2017                              OPERATIVE REPORT  SURGEON:  Leta Baptist, MD  PREOPERATIVE DIAGNOSES: 1. Respiratory failure. 2. Ventilator dependence 3. Morbid obesity. 4. Severe OSA.  POSTOPERATIVE DIAGNOSES: 1. Respiratory failure. 2. Ventilator dependence. 3. Morbid obesity. 4. Severe OSA.  PROCEDURE PERFORMED:  Tracheostomy  ANESTHESIA:  General endotracheal tube anesthesia.  COMPLICATIONS:  None.  ESTIMATED BLOOD LOSS:  Minimal.  INDICATION FOR PROCEDURE:  Timothy Le is a 57 y.o. male with a history of morbid obesity, severe OSA, respiratory failure and ventilator dependence. The patient could not be weaned from the ventilator. The decision was therefore made to proceed with tracheostomy tube placement. Likelihood of success in ventilator weaning was also discussed with the patient's sister.  The risks, benefits, alternatives, and details of the procedure were discussed with the sister.  Questions were invited and answered.  Informed consent was obtained.  DESCRIPTION:  The patient was taken to the operating room and placed supine on the operating table. General anesthesia was administered via the pre-existing endotracheal tube. The patient was positioned and prepped and draped in the standard fashion for tracheostomy tube placement. 1% lidocaine with 1-100,000 epinephrine was injected at the anterior neck. A 2 cm vertical incision was made in the anterior necks, at the level slightly below the cricoid bone. The incision was carried down past the level of the platysma muscle. The strep muscles were identified and divided in midline. They were retracted laterally, exposing the thyroid gland. The thyroid was divided at midline and retracted laterally. The patient has a large left thyroid lobe, pushing the trachea to the right. The anterior tracheal wall was exposed. A tracheal window was made at the level of the second tracheal ring. The endotracheal tube  was withdrawn. A # 7 XLT cuffed Shiley tracheostomy tube was placed without difficulty. Good end tidal volume and CO2 return was noted. The tracheostomy tube was secured in place with 2-0 Prolene sutures and circumferential necktie. The care of the patient was transferred to the anesthesiologist. The patient was awakened from anesthesia without difficulty. He was transferred back to the intensive care unit in stable condition.  OPERATIVE FINDINGS:  A #7 XLT cuffed Shiley tracheostomy tube was placed without difficulty.  SPECIMEN:  None.  FOLLOWUP CARE:  The patient will return to the ICU.   Aubri Gathright W Helon Wisinski 06/17/2017 9:45 AM

## 2017-06-17 NOTE — Progress Notes (Addendum)
57 year old morbidly obese man with severe OSA, noncompliant with CPAP therapy, admitted 5/22 with acute hypercarbic respiratory failure.  ETT 5/23 >> 5/30 trach >>  He underwent tracheostomy in the OR today. XLT 7 Sedated on propofol and fentanyl drip, RA SS -5, morbidly obese Exam limited but decreased breath sounds bilateral, soft nontender abdomen, 1+ edema  Labs show normal electrolytes, creatinine steady at 1.3, mild anemia  Impression/plan  Acute hypercarbic respiratory failure/underlying obesity hypoventilation/severe OSA with CPAP noncompliance  -Status post tracheostomy today, keep deeply sedated today and then tomorrow can progress hopefully to trach collar soon, remains to be seen whether he will need nocturnal ventilation.  Acute encephalopathy - continue propofol and fentanyl for now,we will discontinue Seroquel soon.  AKI on CKD stage III  - cr back to baseline,continue home dose torsemide-, electrolytes okay.  My critical care time x 76m  Kara Mead MD. FCCP. [facility] Pager [phone] If no response call [phone]   06/17/2017

## 2017-06-18 ENCOUNTER — Encounter (HOSPITAL_COMMUNITY): Payer: Self-pay | Admitting: Otolaryngology

## 2017-06-18 DIAGNOSIS — J9691 Respiratory failure, unspecified with hypoxia: Secondary | ICD-10-CM

## 2017-06-18 DIAGNOSIS — J441 Chronic obstructive pulmonary disease with (acute) exacerbation: Secondary | ICD-10-CM

## 2017-06-18 DIAGNOSIS — J398 Other specified diseases of upper respiratory tract: Secondary | ICD-10-CM

## 2017-06-18 DIAGNOSIS — G4733 Obstructive sleep apnea (adult) (pediatric): Secondary | ICD-10-CM

## 2017-06-18 DIAGNOSIS — E662 Morbid (severe) obesity with alveolar hypoventilation: Secondary | ICD-10-CM

## 2017-06-18 LAB — GLUCOSE, CAPILLARY
GLUCOSE-CAPILLARY: 120 mg/dL — AB (ref 65–99)
GLUCOSE-CAPILLARY: 130 mg/dL — AB (ref 65–99)
GLUCOSE-CAPILLARY: 125 mg/dL — AB (ref 65–99)
Glucose-Capillary: 100 mg/dL — ABNORMAL HIGH (ref 65–99)
Glucose-Capillary: 94 mg/dL (ref 65–99)
Glucose-Capillary: 151 mg/dL — ABNORMAL HIGH (ref 65–99)

## 2017-06-18 LAB — CBC WITH DIFFERENTIAL/PLATELET
Abs Immature Granulocytes: 0 10*3/uL (ref 0.0–0.1)
BASOS PCT: 0 %
Basophils Absolute: 0 10*3/uL (ref 0.0–0.1)
EOS ABS: 0.2 10*3/uL (ref 0.0–0.7)
EOS PCT: 3 %
HCT: 33 % — ABNORMAL LOW (ref 39.0–52.0)
Hemoglobin: 9.3 g/dL — ABNORMAL LOW (ref 13.0–17.0)
Immature Granulocytes: 1 %
LYMPHS PCT: 13 %
Lymphs Abs: 0.9 10*3/uL (ref 0.7–4.0)
MCH: 25.8 pg — AB (ref 26.0–34.0)
MCHC: 28.2 g/dL — AB (ref 30.0–36.0)
MCV: 91.7 fL (ref 78.0–100.0)
MONO ABS: 1 10*3/uL (ref 0.1–1.0)
Monocytes Relative: 15 %
Neutro Abs: 4.7 10*3/uL (ref 1.7–7.7)
Neutrophils Relative %: 68 %
PLATELETS: 251 10*3/uL (ref 150–400)
RBC: 3.6 MIL/uL — AB (ref 4.22–5.81)
RDW: 17.2 % — AB (ref 11.5–15.5)
WBC: 6.8 10*3/uL (ref 4.0–10.5)

## 2017-06-18 LAB — BASIC METABOLIC PANEL
Anion gap: 12 (ref 5–15)
BUN: 24 mg/dL — AB (ref 6–20)
CALCIUM: 8.9 mg/dL (ref 8.9–10.3)
CO2: 28 mmol/L (ref 22–32)
CREATININE: 1.16 mg/dL (ref 0.61–1.24)
Chloride: 99 mmol/L — ABNORMAL LOW (ref 101–111)
GFR calc non Af Amer: >60 mL/min (ref >60)
Glucose, Bld: 109 mg/dL — ABNORMAL HIGH (ref 65–99)
Potassium: 3.7 mmol/L (ref 3.5–5.1)
SODIUM: 139 mmol/L (ref 135–145)

## 2017-06-18 MED ORDER — DEXMEDETOMIDINE HCL IN NACL 400 MCG/100ML IV SOLN
0.4000 ug/kg/h | INTRAVENOUS | Status: DC
  Administered 2017-06-18: 1 ug/kg/h via INTRAVENOUS
  Administered 2017-06-18: 1.2 ug/kg/h via INTRAVENOUS
  Administered 2017-06-19: 0.8 ug/kg/h via INTRAVENOUS
  Administered 2017-06-19: 1.2 ug/kg/h via INTRAVENOUS
  Administered 2017-06-19: 1 ug/kg/h via INTRAVENOUS
  Administered 2017-06-19 (×2): 1.2 ug/kg/h via INTRAVENOUS
  Administered 2017-06-19: 1.1 ug/kg/h via INTRAVENOUS
  Administered 2017-06-19 (×2): 1 ug/kg/h via INTRAVENOUS
  Administered 2017-06-19: 1.1 ug/kg/h via INTRAVENOUS
  Administered 2017-06-19 – 2017-06-20 (×2): 1.2 ug/kg/h via INTRAVENOUS
  Administered 2017-06-20: 1.1 ug/kg/h via INTRAVENOUS
  Administered 2017-06-20 (×2): 1.2 ug/kg/h via INTRAVENOUS
  Administered 2017-06-20 (×2): 1.1 ug/kg/h via INTRAVENOUS
  Administered 2017-06-20 – 2017-06-21 (×8): 1.2 ug/kg/h via INTRAVENOUS
  Administered 2017-06-21 (×2): 1 ug/kg/h via INTRAVENOUS
  Administered 2017-06-21: 0.8 ug/kg/h via INTRAVENOUS
  Administered 2017-06-21 (×3): 1.2 ug/kg/h via INTRAVENOUS
  Administered 2017-06-22 (×2): 1 ug/kg/h via INTRAVENOUS
  Administered 2017-06-22: 1.2 ug/kg/h via INTRAVENOUS
  Administered 2017-06-22 (×6): 1 ug/kg/h via INTRAVENOUS
  Administered 2017-06-23: 1.2 ug/kg/h via INTRAVENOUS
  Administered 2017-06-23: 0.7 ug/kg/h via INTRAVENOUS
  Administered 2017-06-23 (×2): 1.2 ug/kg/h via INTRAVENOUS
  Administered 2017-06-23: 0.7 ug/kg/h via INTRAVENOUS
  Administered 2017-06-23 – 2017-06-24 (×8): 1.2 ug/kg/h via INTRAVENOUS
  Filled 2017-06-18 (×3): qty 100
  Filled 2017-06-18: qty 200
  Filled 2017-06-18 (×8): qty 100
  Filled 2017-06-18: qty 200
  Filled 2017-06-18: qty 100
  Filled 2017-06-18: qty 200
  Filled 2017-06-18 (×10): qty 100
  Filled 2017-06-18: qty 200
  Filled 2017-06-18 (×2): qty 100
  Filled 2017-06-18: qty 200
  Filled 2017-06-18 (×14): qty 100
  Filled 2017-06-18: qty 200
  Filled 2017-06-18 (×8): qty 100

## 2017-06-18 NOTE — Progress Notes (Signed)
57 year old morbidly obese man with severe OSA, noncompliant with CPAP therapy, admitted 5/22 with acute hypercarbic respiratory failure.  ETT 5/23 >> 5/30 trach >>  Mild agitation on make-up assessment, remains on low-dose propofol and fentanyl drip, RA SS -1, opens eyes to name, morbidly obese, decreased breath sounds bilateral, soft nontender abdomen, 1+ bipedal edema.  Labs show normal electrolytes, improving creatinine, no leukocytosis, mild anemia.  Chest x-ray 5/30 shows XLT trach in position.  Impression/plan  Acute hypercarbic respiratory failure/underlying obesity hypoventilation/severe OSA with CPAP noncompliance  -Status post tracheostomy  Goal is to start weaning with-daytime trach collar, remains to be seen with daily nocturnal ventilation. -Speech eval early Obtain core track for now, hopefully will not need PEG-  Acute encephalopathy-changed to Precedex and fentanyl, discontinue Seroquel based on his level of agitation.  AKI -resolved, continue torsemide, electrolytes are clear.  My cc time x 81m  Kara Mead MD. FCCP. [facility] Pager [phone] If no response call [phone]   06/18/2017

## 2017-06-18 NOTE — Progress Notes (Signed)
Subjective: No trach issues overnight. On vent.  Objective: Vital signs in last 24 hours: Temp:  [98.5 F (36.9 C)-100.4 F (38 C)] 98.5 F (36.9 C) (05/31 0731) Pulse Rate:  [76-105] 76 (05/31 0824) Resp:  [15-22] 16 (05/31 0824) BP: (102-139)/(62-91) 102/62 (05/31 0600) SpO2:  [91 %-100 %] 94 % (05/31 0824) FiO2 (%):  [40 %] 40 % (05/31 0824) Weight:  [356 lb 14.8 oz (161.9 kg)] 356 lb 14.8 oz (161.9 kg) (05/31 0446)  Exam: General:morbidly obese male, sedated Eyes:PERRL, clear conjunctiva, EOMcould not be tested. Ears: Normal auricles and EACs. Nose: Normal mucosa, septum, and turbinates. Mouth: Normal mucosa. No lesion. Neck: Severely obese. Trach midline and in place. No bleeding. Respiratory:On vent.   Recent Labs    06/17/17 0410 06/18/17 0502  WBC 8.4 6.8  HGB 9.9* 9.3*  HCT 34.6* 33.0*  PLT 227 251   Recent Labs    06/17/17 0410 06/18/17 0502  NA 143 139  K 3.8 3.7  CL 103 99*  CO2 31 28  GLUCOSE 102* 109*  BUN 31* 24*  CREATININE 1.29* 1.16  CALCIUM 8.8* 8.9    Medications:  I have reviewed the patient's current medications. Scheduled: . chlorhexidine gluconate (MEDLINE KIT)  15 mL Mouth Rinse BID  . Chlorhexidine Gluconate Cloth  6 each Topical Daily  . docusate  100 mg Per Tube BID  . enoxaparin (LOVENOX) injection  40 mg Subcutaneous Q24H  . feeding supplement (PRO-STAT SUGAR FREE 64)  60 mL Per Tube BID  . insulin aspart  3-9 Units Subcutaneous Q4H  . insulin glargine  10 Units Subcutaneous QHS  . ipratropium-albuterol  3 mL Nebulization Q6H  . LORazepam  2 mg Per Tube Q8H  . mouth rinse  15 mL Mouth Rinse 10 times per day  . QUEtiapine  100 mg Oral BID  . sodium chloride flush  3 mL Intravenous Q12H  . torsemide  10 mg Oral Daily   Continuous: . sodium chloride 10 mL/hr at 06/17/17 1800  . famotidine (PEPCID) IV Stopped (06/17/17 2215)  . feeding supplement (VITAL HIGH PROTEIN) Stopped (06/17/17 0001)  . fentaNYL infusion  INTRAVENOUS 120 mcg/hr (06/17/17 1900)  . propofol (DIPRIVAN) infusion 15 mcg/kg/min (06/18/17 0525)    Assessment/Plan: POD #1 s/p trach.  Venting well via trach. No bleeding. - Wean vent support as tolerated. - Fresh trach care. - Will change trach in approximately 2 weeks.   LOS: 9 days   Sophiana Milanese W Naji Mehringer 06/18/2017, 9:20 AM

## 2017-06-18 NOTE — Progress Notes (Addendum)
Nutrition Follow-up  DOCUMENTATION CODES:   Morbid obesity  INTERVENTION:   Resume TF:  Vital High Protein at 50 ml/h (1200 ml per day)  Pro-stat 60 ml BID  Provides 1600 kcal, 165 gm protein, 1003 ml free water daily  NUTRITION DIAGNOSIS:   Inadequate oral intake related to inability to eat as evidenced by NPO status.  Ongoing  GOAL:   Provide needs based on ASPEN/SCCM guidelines  Will be met with TF  MONITOR:   Vent status, Labs, I & O's  ASSESSMENT:   57 yo male with PMH of morbid obesity, asthma, HTN, sleep apnea, COPD, and DM who was admitted on 5/22 with acute respiratory failure, COPD exacerbation, likely new onset HF. Required intubation shortly after admission.  Discussed patient in ICU rounds and with RN today. RD to order TF today. S/P tracheostomy 5/30. Cortrak was placed today with tip in post-pyloric position. Patient remains on ventilator support Temp (24hrs), Avg:98.9 F (37.2 C), Min:98.1 F (36.7 C), Max:100.4 F (38 C)  Propofol: 9.8 ml/hr providing 259 kcal from lipid Labs reviewed. CBG's: 120-100-94 Medications reviewed.   Diet Order:   Diet Order           Diet NPO time specified  Diet effective midnight          EDUCATION NEEDS:   No education needs have been identified at this time  Skin:  Skin Assessment: Reviewed RN Assessment(neck incision)  Last BM:  5/31 (rectal tube)  Height:   Ht Readings from Last 1 Encounters:  06/10/17 _0  (1.676 m)    Weight:   Wt Readings from Last 1 Encounters:  06/18/17 (!) 356 lb 14.8 oz (161.9 kg)    Ideal Body Weight:  64.5 kg  BMI:  Body mass index is 57.61 kg/m.  Estimated Nutritional Needs:   Kcal:  8676-1950  Protein:  161 gm  Fluid:  2 L    Molli Barrows, RD, LDN, [place] Pager [phone] After Hours Pager [phone]

## 2017-06-18 NOTE — Progress Notes (Signed)
Cortrak Tube Team Note:  Consult received to place a Cortrak feeding tube.   A 10 F Cortrak tube was placed in the RIGHT nare and secured with a nasal bridle at 82 cm. Per the Cortrak monitor reading the tube tip is post-pyloric.   No x-ray is required. RN may begin using tube.   If the tube becomes dislodged please keep the tube and contact the Cortrak team at [email or web address] ([email or web address]) for replacement.  If after hours and replacement cannot be delayed, place a NG tube and confirm placement with an abdominal x-ray.    Gaynell Face, MS, RD, LDN Pager: [phone] Weekend/After Hours: [phone]

## 2017-06-19 DIAGNOSIS — J9611 Chronic respiratory failure with hypoxia: Secondary | ICD-10-CM

## 2017-06-19 DIAGNOSIS — L8990 Pressure ulcer of unspecified site, unspecified stage: Secondary | ICD-10-CM | POA: Diagnosis present

## 2017-06-19 DIAGNOSIS — J9612 Chronic respiratory failure with hypercapnia: Secondary | ICD-10-CM

## 2017-06-19 DIAGNOSIS — Z930 Tracheostomy status: Secondary | ICD-10-CM

## 2017-06-19 LAB — BASIC METABOLIC PANEL
Anion gap: 7 (ref 5–15)
BUN: 26 mg/dL — AB (ref 6–20)
CHLORIDE: 107 mmol/L (ref 101–111)
CO2: 27 mmol/L (ref 22–32)
CREATININE: 0.89 mg/dL (ref 0.61–1.24)
Calcium: 7.9 mg/dL — ABNORMAL LOW (ref 8.9–10.3)
GFR calc Af Amer: >60 mL/min (ref >60)
GFR calc non Af Amer: >60 mL/min (ref >60)
Glucose, Bld: 134 mg/dL — ABNORMAL HIGH (ref 65–99)
POTASSIUM: 3.2 mmol/L — AB (ref 3.5–5.1)
SODIUM: 141 mmol/L (ref 135–145)

## 2017-06-19 LAB — CBC WITH DIFFERENTIAL/PLATELET
ABS IMMATURE GRANULOCYTES: 0 10*3/uL (ref 0.0–0.1)
Basophils Absolute: 0 10*3/uL (ref 0.0–0.1)
Basophils Relative: 0 %
EOS PCT: 4 %
Eosinophils Absolute: 0.2 10*3/uL (ref 0.0–0.7)
HEMATOCRIT: 30.4 % — AB (ref 39.0–52.0)
HEMOGLOBIN: 8.7 g/dL — AB (ref 13.0–17.0)
Immature Granulocytes: 1 %
LYMPHS ABS: 1 10*3/uL (ref 0.7–4.0)
LYMPHS PCT: 20 %
MCH: 26 pg (ref 26.0–34.0)
MCHC: 28.6 g/dL — AB (ref 30.0–36.0)
MCV: 91 fL (ref 78.0–100.0)
MONO ABS: 0.7 10*3/uL (ref 0.1–1.0)
MONOS PCT: 14 %
NEUTROS ABS: 3 10*3/uL (ref 1.7–7.7)
Neutrophils Relative %: 61 %
Platelets: 281 10*3/uL (ref 150–400)
RBC: 3.34 MIL/uL — ABNORMAL LOW (ref 4.22–5.81)
RDW: 17.2 % — ABNORMAL HIGH (ref 11.5–15.5)
WBC: 4.9 10*3/uL (ref 4.0–10.5)

## 2017-06-19 LAB — GLUCOSE, CAPILLARY
GLUCOSE-CAPILLARY: 126 mg/dL — AB (ref 65–99)
GLUCOSE-CAPILLARY: 130 mg/dL — AB (ref 65–99)
GLUCOSE-CAPILLARY: 118 mg/dL — AB (ref 65–99)
GLUCOSE-CAPILLARY: 132 mg/dL — AB (ref 65–99)
GLUCOSE-CAPILLARY: 122 mg/dL — AB (ref 65–99)
GLUCOSE-CAPILLARY: 120 mg/dL — AB (ref 65–99)
Glucose-Capillary: 136 mg/dL — ABNORMAL HIGH (ref 65–99)

## 2017-06-19 MED ORDER — HALOPERIDOL LACTATE 5 MG/ML IJ SOLN
INTRAMUSCULAR | Status: AC
  Filled 2017-06-19: qty 1

## 2017-06-19 MED ORDER — POTASSIUM CHLORIDE 20 MEQ/15ML (10%) PO SOLN
40.0000 meq | ORAL | Status: AC
  Administered 2017-06-19 (×2): 40 meq
  Filled 2017-06-19 (×2): qty 30

## 2017-06-19 MED ORDER — MIDAZOLAM HCL 2 MG/2ML IJ SOLN
INTRAMUSCULAR | Status: AC
  Filled 2017-06-19: qty 4

## 2017-06-19 MED ORDER — MIDAZOLAM HCL 2 MG/2ML IJ SOLN
4.0000 mg | Freq: Once | INTRAMUSCULAR | Status: AC
  Administered 2017-06-19: 4 mg via INTRAVENOUS

## 2017-06-19 MED ORDER — METOLAZONE 5 MG PO TABS
5.0000 mg | ORAL_TABLET | Freq: Once | ORAL | Status: AC
  Administered 2017-06-19: 5 mg via ORAL
  Filled 2017-06-19: qty 1

## 2017-06-19 MED ORDER — FENTANYL CITRATE (PF) 100 MCG/2ML IJ SOLN
25.0000 ug | INTRAMUSCULAR | Status: DC | PRN
  Administered 2017-06-19 – 2017-07-07 (×61): 100 ug via INTRAVENOUS
  Filled 2017-06-19 (×62): qty 2

## 2017-06-19 MED ORDER — HALOPERIDOL LACTATE 5 MG/ML IJ SOLN
5.0000 mg | Freq: Once | INTRAMUSCULAR | Status: AC
  Administered 2017-06-19: 5 mg via INTRAMUSCULAR

## 2017-06-19 NOTE — Progress Notes (Signed)
[facility] Progress Note Patient Name: Timothy Le DOB: 06/13/1960 MRN: [id number]   Date of Service  06/19/2017  HPI/Events of Note  hypokalemia  eICU Interventions  Potassium replaced     Intervention Category Intermediate Interventions: Electrolyte abnormality - evaluation and management  Shanequia Kendrick 06/19/2017, 5:59 AM

## 2017-06-19 NOTE — Progress Notes (Signed)
During this round, pt became very agitated.  Attempted to swing at and scratch nursing staff.  Attempted to calm pt down with verbal cues.  Gave Fentanyl 127mcg at this time.

## 2017-06-19 NOTE — Progress Notes (Signed)
Pt agitated again.  Attempting to get self out of restraints.  Shaking bed.  Turning head briskly.  Notified Dr. Lake Bells who orders haldol im and versed iv x one dose.

## 2017-06-19 NOTE — Progress Notes (Signed)
PULMONARY / CRITICAL CARE MEDICINE   Name: Timothy Le MRN: [id number] DOB: June 21, 1960    ADMISSION DATE:  06/09/2017  CHIEF COMPLAINT:  Dyspnea  HISTORY OF PRESENT ILLNESS:   57 y/o male with morbid obesity admitted on 5/22, intubated not long afterwards in the setting of hypercarbia due to obesity, CHF and bronchitis.  Had a tracheostomy on 5/30.   SUBJECTIVE:  Sedated No acute events  VITAL SIGNS: BP 103/68   Pulse (!) 56   Temp 98.2 F (36.8 C) (Oral)   Resp 15   Ht 5\' 6"  (1.676 m)   Wt (!) 358 lb 4 oz (162.5 kg)   SpO2 93%   BMI 57.82 kg/m   HEMODYNAMICS:    VENTILATOR SETTINGS: Vent Mode: PRVC FiO2 (%):  [40 %] 40 % Set Rate:  [15 bmp] 15 bmp Vt Set:  [510 mL] 510 mL PEEP:  [5 cmH20-8 cmH20] 8 cmH20 Pressure Support:  [12 cmH20] 12 cmH20 Plateau Pressure:  [25 cmH20-32 cmH20] 26 cmH20  INTAKE / OUTPUT: I/O last 3 completed shifts: In: 1619.9 [I.V.:1469.9; IV Piggyback:150] Out: 8756 [Urine:1440; Stool:30]  PHYSICAL EXAMINATION:  General:  Morbidly obese, in bed on vent HENT: NCAT Tracheostomy  PULM: CTA B, vent supported breathing CV: RRR, no mgr GI: BS+, soft, nontender MSK: normal bulk and tone Neuro: sedated on vent  LABS:  BMET Recent Labs  Lab 06/17/17 0410 06/18/17 0502 06/19/17 0401  NA 143 139 141  K 3.8 3.7 3.2*  CL 103 99* 107  CO2 31 28 27   BUN 31* 24* 26*  CREATININE 1.29* 1.16 0.89  GLUCOSE 102* 109* 134*    Electrolytes Recent Labs  Lab 06/13/17 1608 06/14/17 0321  06/16/17 0438 06/17/17 0410 06/18/17 0502 06/19/17 0401  CALCIUM  --  8.8*   < > 9.0 8.8* 8.9 7.9*  MG 2.0 2.1  --  2.0  --   --   --   PHOS 3.7 3.8  --  3.2  --   --   --    < > = values in this interval not displayed.    CBC Recent Labs  Lab 06/17/17 0410 06/18/17 0502 06/19/17 0401  WBC 8.4 6.8 4.9  HGB 9.9* 9.3* 8.7*  HCT 34.6* 33.0* 30.4*  PLT 227 251 281    Coag's No results for input(s): APTT, INR in the last 168  hours.  Sepsis Markers No results for input(s): LATICACIDVEN, PROCALCITON, O2SATVEN in the last 168 hours.  ABG No results for input(s): PHART, PCO2ART, PO2ART in the last 168 hours.  Liver Enzymes No results for input(s): AST, ALT, ALKPHOS, BILITOT, ALBUMIN in the last 168 hours.  Cardiac Enzymes No results for input(s): TROPONINI, PROBNP in the last 168 hours.  Glucose Recent Labs  Lab 06/18/17 1203 06/18/17 1529 06/18/17 2015 06/18/17 2323 06/19/17 0400 06/19/17 0827  GLUCAP 94 130* 125* 151* 126* 130*    Imaging No results found.   STUDIES:  07/2016 PFT> no airflow obstruction, severe restriction. 5/23 Echo> LVEF 55-60%, otherwise poor visualization  CULTURES:   ANTIBIOTICS: 5/22 levaquin > 5/24  SIGNIFICANT EVENTS:   LINES/TUBES: 5/23 ETT > 5/30 5/30 tracheostomy >   DISCUSSION: 57 y/o male with morbid obesity and CHF who has acute on chronic respiratory failure in the setting of bronchitis and likely CHF.  Now s/p tracheostomy  ASSESSMENT / PLAN:  PULMONARY A: Acute respiratory failure with hypercarbia Obesity hypoventilation syndrome Acute bronchitis resolve P:   Wean vent today > pressure support  as tolerated Wean sedation to facilitate weaning on vent Full mechanical vent support VAP prevention Daily WUA/SBT Encourage weight loss  CARDIOVASCULAR A:  Chronic diastolic heart failure P:  Continue demadex Tele  RENAL A:   Hypokalemia P:   Monitor BMET and UOP Replace electrolytes as needed   GASTROINTESTINAL A:   No acute issues P:   Continue tube feeding Continue medical stress ulcer prophylaxis   HEMATOLOGIC A:   Anemia, no bleeding P:  Monitor for bleeding Transfuse PRBC for Hgb < 7 gm/dL   INFECTIOUS A:   Acute bronchitis resolved P:   Monitor for fever  ENDOCRINE A:   Mild hyperglycemia P:   Monitor glucose SSI  NEUROLOGIC A:   Intermittent agitation> heavily sedated on 6/1, need to start to wake  up P:   RASS goal: 0 Stop fentanyl infusion Continue precedex per PAD protocol Use fentanyl prn Continue scheduled ativan and seroquel for now   FAMILY  - Updates: none bedside  My cc time 30 minutes  Roselie Awkward, MD [place] [facility] Pager: [phone] Cell: [phone] After 3pm or if no response, call [phone]  06/19/2017, 11:21 AM

## 2017-06-19 NOTE — Progress Notes (Signed)
RT note: Attempted to wean patient this AM for morning SBT.  When placed patient on CPAP/PSV of 15/8, patient's sats dropped to 81% with a good waveform.  Placed patient back on full support and sats improved to 95%.  Will continue to monitor.

## 2017-06-20 DIAGNOSIS — J810 Acute pulmonary edema: Secondary | ICD-10-CM

## 2017-06-20 LAB — CBC WITH DIFFERENTIAL/PLATELET
Abs Immature Granulocytes: 0 10*3/uL (ref 0.0–0.1)
BASOS PCT: 0 %
Basophils Absolute: 0 10*3/uL (ref 0.0–0.1)
EOS ABS: 0.2 10*3/uL (ref 0.0–0.7)
Eosinophils Relative: 3 %
HEMATOCRIT: 33.8 % — AB (ref 39.0–52.0)
Hemoglobin: 9.5 g/dL — ABNORMAL LOW (ref 13.0–17.0)
Immature Granulocytes: 1 %
LYMPHS ABS: 1.5 10*3/uL (ref 0.7–4.0)
Lymphocytes Relative: 23 %
MCH: 25.5 pg — ABNORMAL LOW (ref 26.0–34.0)
MCHC: 28.1 g/dL — AB (ref 30.0–36.0)
MCV: 90.9 fL (ref 78.0–100.0)
Monocytes Absolute: 0.7 10*3/uL (ref 0.1–1.0)
Monocytes Relative: 10 %
Neutro Abs: 4 10*3/uL (ref 1.7–7.7)
Neutrophils Relative %: 63 %
Platelets: 291 10*3/uL (ref 150–400)
RBC: 3.72 MIL/uL — AB (ref 4.22–5.81)
RDW: 17.2 % — AB (ref 11.5–15.5)
WBC: 6.4 10*3/uL (ref 4.0–10.5)

## 2017-06-20 LAB — GLUCOSE, CAPILLARY
GLUCOSE-CAPILLARY: 135 mg/dL — AB (ref 65–99)
GLUCOSE-CAPILLARY: 130 mg/dL — AB (ref 65–99)
GLUCOSE-CAPILLARY: 127 mg/dL — AB (ref 65–99)
Glucose-Capillary: 119 mg/dL — ABNORMAL HIGH (ref 65–99)
Glucose-Capillary: 111 mg/dL — ABNORMAL HIGH (ref 65–99)
Glucose-Capillary: 122 mg/dL — ABNORMAL HIGH (ref 65–99)

## 2017-06-20 LAB — BASIC METABOLIC PANEL
Anion gap: 8 (ref 5–15)
BUN: 27 mg/dL — ABNORMAL HIGH (ref 6–20)
CALCIUM: 8.5 mg/dL — AB (ref 8.9–10.3)
CO2: 29 mmol/L (ref 22–32)
CREATININE: 0.89 mg/dL (ref 0.61–1.24)
Chloride: 108 mmol/L (ref 101–111)
GFR calc non Af Amer: >60 mL/min (ref >60)
GLUCOSE: 126 mg/dL — AB (ref 65–99)
Potassium: 3.4 mmol/L — ABNORMAL LOW (ref 3.5–5.1)
Sodium: 145 mmol/L (ref 135–145)

## 2017-06-20 MED ORDER — CLONAZEPAM 0.5 MG PO TABS
1.0000 mg | ORAL_TABLET | Freq: Two times a day (BID) | ORAL | Status: DC
  Administered 2017-06-20 – 2017-06-21 (×4): 1 mg via ORAL
  Filled 2017-06-20 (×4): qty 2

## 2017-06-20 MED ORDER — QUETIAPINE FUMARATE 100 MG PO TABS
100.0000 mg | ORAL_TABLET | Freq: Once | ORAL | Status: AC
  Administered 2017-06-20: 100 mg via ORAL
  Filled 2017-06-20: qty 1

## 2017-06-20 MED ORDER — POTASSIUM CHLORIDE 20 MEQ/15ML (10%) PO SOLN
20.0000 meq | ORAL | Status: AC
  Administered 2017-06-20 (×2): 20 meq
  Filled 2017-06-20 (×2): qty 15

## 2017-06-20 NOTE — Progress Notes (Signed)
RT note: patient placed on CPAP/PSV 18/8 at 0750.  Currently tolerating well.  Will continue to monitor.

## 2017-06-20 NOTE — Progress Notes (Signed)
..  [facility] ADULT ICU REPLACEMENT PROTOCOL FOR AM LAB REPLACEMENT ONLY  The patient does apply for the [facility] Adult ICU Electrolyte Replacment Protocol based on the criteria listed below:   1. Is GFR >/= 40 ml/min? Yes.    Patient's GFR today is >60 2. Is urine output >/= 0.5 ml/kg/hr for the last 6 hours? Yes.   Patient's UOP is 0.61 ml/kg/hr 3. Is BUN < 60 mg/dL? Yes.    Patient's BUN today is 27 4. Abnormal electrolyte(s): K+ 3.4 5. Ordered repletion with: protocol 6. If a panic level lab has been reported, has the CCM MD in charge been notified? No..   Physician:  Dr.Deterding  Timothy Le 06/20/2017 5:43 AM

## 2017-06-20 NOTE — Progress Notes (Signed)
PULMONARY / CRITICAL CARE MEDICINE   Name: Timothy Le MRN: [id number] DOB: 12-04-1960    ADMISSION DATE:  06/09/2017  CHIEF COMPLAINT:  Dyspnea  HISTORY OF PRESENT ILLNESS:   57 y/o male with morbid obesity admitted on 5/22, intubated not long afterwards in the setting of hypercarbia due to obesity, CHF and bronchitis.  Had a tracheostomy on 5/30.   SUBJECTIVE:  Diuresed, hypokalemic More agitated yesterday, gave haldol  VITAL SIGNS: BP 113/73 (BP Location: Left Arm)   Pulse 78   Temp 99.3 F (37.4 C) (Oral)   Resp (!) 25   Ht 5\' 6"  (1.676 m)   Wt (!) 359 lb 2.1 oz (162.9 kg)   SpO2 100%   BMI 57.96 kg/m   HEMODYNAMICS:    VENTILATOR SETTINGS: Vent Mode: PSV;CPAP FiO2 (%):  [40 %] 40 % Set Rate:  [15 bmp] 15 bmp Vt Set:  [510 mL] 510 mL PEEP:  [8 cmH20] 8 cmH20 Pressure Support:  [15 cmH20-18 cmH20] 18 cmH20 Plateau Pressure:  [24 cmH20-38 cmH20] 38 cmH20  INTAKE / OUTPUT: I/O last 3 completed shifts: In: 3247.9 [I.V.:2167.1; Other:60; NG/GT:870.8; IV Piggyback:150] Out: 2980 [Urine:2880; Stool:100]  PHYSICAL EXAMINATION:  General:  Morbidly obese, in bed on vent HENT: NCAT tracheostomy in place PULM: CTA B, vent supported breathing CV: RRR, no mgr GI: BS+, soft, nontender MSK: normal bulk and tone Neuro: sedated on vent  LABS:  BMET Recent Labs  Lab 06/18/17 0502 06/19/17 0401 06/20/17 0428  NA 139 141 145  K 3.7 3.2* 3.4*  CL 99* 107 108  CO2 28 27 29   BUN 24* 26* 27*  CREATININE 1.16 0.89 0.89  GLUCOSE 109* 134* 126*    Electrolytes Recent Labs  Lab 06/13/17 1608 06/14/17 0321  06/16/17 0438  06/18/17 0502 06/19/17 0401 06/20/17 0428  CALCIUM  --  8.8*   < > 9.0   < > 8.9 7.9* 8.5*  MG 2.0 2.1  --  2.0  --   --   --   --   PHOS 3.7 3.8  --  3.2  --   --   --   --    < > = values in this interval not displayed.    CBC Recent Labs  Lab 06/18/17 0502 06/19/17 0401 06/20/17 0428  WBC 6.8 4.9 6.4  HGB 9.3* 8.7* 9.5*   HCT 33.0* 30.4* 33.8*  PLT 251 281 291    Coag's No results for input(s): APTT, INR in the last 168 hours.  Sepsis Markers No results for input(s): LATICACIDVEN, PROCALCITON, O2SATVEN in the last 168 hours.  ABG No results for input(s): PHART, PCO2ART, PO2ART in the last 168 hours.  Liver Enzymes No results for input(s): AST, ALT, ALKPHOS, BILITOT, ALBUMIN in the last 168 hours.  Cardiac Enzymes No results for input(s): TROPONINI, PROBNP in the last 168 hours.  Glucose Recent Labs  Lab 06/19/17 1608 06/19/17 1635 06/19/17 2037 06/19/17 2254 06/20/17 0352 06/20/17 0822  GLUCAP 132* 122* 120* 136* 119* 111*    Imaging No results found.   STUDIES:  07/2016 PFT> no airflow obstruction, severe restriction. 5/23 Echo> LVEF 55-60%, otherwise poor visualization  CULTURES:   ANTIBIOTICS: 5/22 levaquin > 5/24  SIGNIFICANT EVENTS:   LINES/TUBES: 5/23 ETT > 5/30 5/30 tracheostomy >   DISCUSSION: 57 y/o male with morbid obesity and CHF who has acute on chronic respiratory failure in the setting of bronchitis and likely CHF.  Now s/p tracheostomy.   ASSESSMENT / PLAN:  PULMONARY A: Acute respiratory failure with hypercarbia Obesity hypoventilation syndrome Acute bronchitis resolve P:   Full mechanical vent support VAP prevention Daily WUA/SBT Attempt pressure support again today Continue diuresis today  CARDIOVASCULAR A:  Chronic diastolic heart failure P:  Continue demadex Tele Repeat metolazone again today  RENAL A:   Hypokalemia P:   Monitor BMET and UOP Replace electrolytes as needed   GASTROINTESTINAL A:   No acute issues P:   Continue tube feeding Continue medical stress ulcer prophylaxis  HEMATOLOGIC A:   Anemia, no bleeding P:  Monitor for bleeding Transfuse PRBC for Hgb < 7 gm/dL  INFECTIOUS A:   Acute bronchitis resolved P:   Monitor for fever  ENDOCRINE A:   Mild hyperglycemia P:   Monitor  glucose SSI  NEUROLOGIC A:   Intermittent agitation> heavily sedated on 6/1, need to start to wake up P:   RASS goal 0 Increase seroquel Add clonazepam Continue precedex per PAD protocol Continue prn ativan > does well with this If severe agitation then use Haldol   FAMILY  - Updates: none bedside  My cc time 31 minutes  Roselie Awkward, MD [place] [facility] Pager: [phone] Cell: [phone] After 3pm or if no response, call [phone]  06/20/2017, 10:15 AM

## 2017-06-21 DIAGNOSIS — J9691 Respiratory failure, unspecified with hypoxia: Secondary | ICD-10-CM

## 2017-06-21 DIAGNOSIS — J441 Chronic obstructive pulmonary disease with (acute) exacerbation: Secondary | ICD-10-CM

## 2017-06-21 DIAGNOSIS — G4733 Obstructive sleep apnea (adult) (pediatric): Secondary | ICD-10-CM

## 2017-06-21 LAB — CBC WITH DIFFERENTIAL/PLATELET
ABS IMMATURE GRANULOCYTES: 0.1 10*3/uL (ref 0.0–0.1)
BASOS PCT: 0 %
Basophils Absolute: 0 10*3/uL (ref 0.0–0.1)
EOS PCT: 3 %
Eosinophils Absolute: 0.2 10*3/uL (ref 0.0–0.7)
HCT: 31.2 % — ABNORMAL LOW (ref 39.0–52.0)
HEMOGLOBIN: 9 g/dL — AB (ref 13.0–17.0)
Immature Granulocytes: 1 %
Lymphocytes Relative: 21 %
Lymphs Abs: 1.3 10*3/uL (ref 0.7–4.0)
MCH: 25.6 pg — AB (ref 26.0–34.0)
MCHC: 28.8 g/dL — ABNORMAL LOW (ref 30.0–36.0)
MCV: 88.6 fL (ref 78.0–100.0)
MONOS PCT: 8 %
Monocytes Absolute: 0.5 10*3/uL (ref 0.1–1.0)
NEUTROS ABS: 4.4 10*3/uL (ref 1.7–7.7)
Neutrophils Relative %: 67 %
PLATELETS: 269 10*3/uL (ref 150–400)
RBC: 3.52 MIL/uL — ABNORMAL LOW (ref 4.22–5.81)
RDW: 17.4 % — ABNORMAL HIGH (ref 11.5–15.5)
WBC: 6.5 10*3/uL (ref 4.0–10.5)

## 2017-06-21 LAB — GLUCOSE, CAPILLARY
GLUCOSE-CAPILLARY: 124 mg/dL — AB (ref 65–99)
GLUCOSE-CAPILLARY: 111 mg/dL — AB (ref 65–99)
GLUCOSE-CAPILLARY: 143 mg/dL — AB (ref 65–99)
Glucose-Capillary: 122 mg/dL — ABNORMAL HIGH (ref 65–99)
Glucose-Capillary: 125 mg/dL — ABNORMAL HIGH (ref 65–99)
Glucose-Capillary: 130 mg/dL — ABNORMAL HIGH (ref 65–99)

## 2017-06-21 LAB — BASIC METABOLIC PANEL
Anion gap: 5 (ref 5–15)
BUN: 29 mg/dL — ABNORMAL HIGH (ref 6–20)
CALCIUM: 8.5 mg/dL — AB (ref 8.9–10.3)
CHLORIDE: 105 mmol/L (ref 101–111)
CO2: 31 mmol/L (ref 22–32)
CREATININE: 0.95 mg/dL (ref 0.61–1.24)
GFR calc Af Amer: >60 mL/min (ref >60)
Glucose, Bld: 137 mg/dL — ABNORMAL HIGH (ref 65–99)
POTASSIUM: 3.5 mmol/L (ref 3.5–5.1)
Sodium: 141 mmol/L (ref 135–145)

## 2017-06-21 MED ORDER — POTASSIUM CHLORIDE 20 MEQ/15ML (10%) PO SOLN
40.0000 meq | Freq: Once | ORAL | Status: AC
  Administered 2017-06-21: 40 meq
  Filled 2017-06-21: qty 30

## 2017-06-21 MED ORDER — FAMOTIDINE 40 MG/5ML PO SUSR
20.0000 mg | Freq: Two times a day (BID) | ORAL | Status: DC
  Administered 2017-06-21 – 2017-07-15 (×46): 20 mg
  Filled 2017-06-21 (×49): qty 2.5

## 2017-06-21 MED ORDER — COLLAGENASE 250 UNIT/GM EX OINT
TOPICAL_OINTMENT | Freq: Every day | CUTANEOUS | Status: DC
  Administered 2017-06-21 – 2017-06-23 (×3): via TOPICAL
  Administered 2017-06-24: 1 via TOPICAL
  Administered 2017-06-25: 14:00:00 via TOPICAL
  Administered 2017-06-26: 1 via TOPICAL
  Administered 2017-06-27 – 2017-06-30 (×3): via TOPICAL
  Administered 2017-07-01: 1 via TOPICAL
  Administered 2017-07-02 – 2017-07-08 (×7): via TOPICAL
  Administered 2017-07-09: 1 via TOPICAL
  Administered 2017-07-10 – 2017-07-14 (×5): via TOPICAL
  Filled 2017-06-21 (×4): qty 30

## 2017-06-21 MED ORDER — COLLAGENASE 250 UNIT/GM EX OINT
TOPICAL_OINTMENT | Freq: Every day | CUTANEOUS | Status: DC
  Filled 2017-06-21: qty 30

## 2017-06-21 NOTE — Progress Notes (Signed)
[facility] Progress Note Patient Name: Timothy Le DOB: 20-Apr-1960 MRN: [id number]   Date of Service  06/21/2017  HPI/Events of Note  K+ = 3.5 and Creatinine = 0.95.  eICU Interventions  Will replace K+.     Intervention Category Major Interventions: Electrolyte abnormality - evaluation and management  Sommer,Steven Eugene 06/21/2017, 6:09 AM

## 2017-06-21 NOTE — Progress Notes (Signed)
PULMONARY / CRITICAL CARE MEDICINE   Name: Timothy Le MRN: [id number] DOB: Dec 03, 1960    ADMISSION DATE:  06/09/2017  CHIEF COMPLAINT:  Dyspnea  HISTORY OF PRESENT ILLNESS:   57 y/o male with morbid obesity admitted on 5/22, intubated not long afterwards in the setting of hypercarbia due to obesity, CHF and bronchitis.  Had a tracheostomy on 5/30.   SUBJECTIVE:  Intermittent agitation, has responded to haldol.    VITAL SIGNS: BP 120/75   Pulse 79   Temp 98.6 F (37 C) (Axillary)   Resp 16   Ht 5\' 6"  (1.676 m)   Wt (!) 163.2 kg (359 lb 12.7 oz)   SpO2 100%   BMI 58.07 kg/m   HEMODYNAMICS:    VENTILATOR SETTINGS: Vent Mode: PRVC FiO2 (%):  [40 %] 40 % Set Rate:  [15 bmp] 15 bmp Vt Set:  [510 mL] 510 mL PEEP:  [8 cmH20] 8 cmH20 Plateau Pressure:  [22 cmH20-30 cmH20] 24 cmH20  INTAKE / OUTPUT: I/O last 3 completed shifts: In: 4023.1 [I.V.:2073.1; NG/GT:1800; IV Piggyback:150] Out: 6606 [Urine:3860; Stool:100]  PHYSICAL EXAMINATION:  General:  Morbidly obese, in bed on vent HENT: NCAT tracheostomy in place PULM: CTA B, vent supported breathing CV: RRR, no mgr GI: BS+, soft, nontender MSK: normal bulk and tone Neuro: sedated on vent  LABS:  BMET Recent Labs  Lab 06/19/17 0401 06/20/17 0428 06/21/17 0447  NA 141 145 141  K 3.2* 3.4* 3.5  CL 107 108 105  CO2 27 29 31   BUN 26* 27* 29*  CREATININE 0.89 0.89 0.95  GLUCOSE 134* 126* 137*    Electrolytes Recent Labs  Lab 06/16/17 0438  06/19/17 0401 06/20/17 0428 06/21/17 0447  CALCIUM 9.0   < > 7.9* 8.5* 8.5*  MG 2.0  --   --   --   --   PHOS 3.2  --   --   --   --    < > = values in this interval not displayed.    CBC Recent Labs  Lab 06/19/17 0401 06/20/17 0428 06/21/17 0447  WBC 4.9 6.4 6.5  HGB 8.7* 9.5* 9.0*  HCT 30.4* 33.8* 31.2*  PLT 281 291 269    Coag's No results for input(s): APTT, INR in the last 168 hours.  Sepsis Markers No results for input(s): LATICACIDVEN,  PROCALCITON, O2SATVEN in the last 168 hours.  ABG No results for input(s): PHART, PCO2ART, PO2ART in the last 168 hours.  Liver Enzymes No results for input(s): AST, ALT, ALKPHOS, BILITOT, ALBUMIN in the last 168 hours.  Cardiac Enzymes No results for input(s): TROPONINI, PROBNP in the last 168 hours.  Glucose Recent Labs  Lab 06/20/17 1618 06/20/17 2005 06/20/17 2316 06/21/17 0354 06/21/17 0756 06/21/17 1143  GLUCAP 130* 122* 127* 124* 130* 125*    Imaging No results found.   STUDIES:  07/2016 PFT> no airflow obstruction, severe restriction. 5/23 Echo> LVEF 55-60%, otherwise poor visualization  CULTURES:   ANTIBIOTICS: 5/22 levaquin > 5/24  SIGNIFICANT EVENTS:   LINES/TUBES: 5/23 ETT > 5/30 5/30 tracheostomy >   DISCUSSION: 56 y/o male with morbid obesity and CHF who has acute on chronic respiratory failure in the setting of bronchitis and likely CHF.  Now s/p tracheostomy.   ASSESSMENT / PLAN:  PULMONARY A: Acute respiratory failure with hypercarbia Obesity hypoventilation syndrome Acute bronchitis resolve P:   Continue to work towards pressure support ventilation as the patient can tolerate Full mechanical ventilator support VAP prevention orders Diuresis  his blood pressure and renal function will tolerate  CARDIOVASCULAR A:  Chronic diastolic heart failure P:  Continue diuresis, torsemide.  Note that metolazone was given on 6/2.  Dose daily Telemetry Consider acetazolamide if he develops a metabolic alkalosis with diuresis  RENAL A:   Hypokalemia P:   Follow urine output, BMP Replace electrolyte as indicated  GASTROINTESTINAL A:   No acute issues P:   Tube feeding as ordered Pepcid for GI prophylaxis  HEMATOLOGIC A:   Anemia, no bleeding P:  Follow for any evidence of bleeding on enoxaparin for DVT prophylaxis Transfuse PRBC for Hgb < 7 gm/dL  INFECTIOUS A:   Acute bronchitis resolved P:   Following clinically off  antibiotics  ENDOCRINE A:   Mild hyperglycemia P:   Sliding scale insulin per protocol Lantus as ordered  DERM A: Appreciate wound care input, note recommendation that he may need surgical evaluation for decubitus ulcer, debridement P: Plan to involve CCS to evaluate the wound in the next few days  NEUROLOGIC A:   Intermittent agitation> now sedated P:   RASS goal 0 Continue clonazepam and Seroquel Stop the scheduled lorazepam now that the clonazepam is ordered. Lorazepam available for as needed. Haldol available for as needed  FAMILY  - Updates: none bedside   Independent CC time 31 minutes  Baltazar Apo, MD, PhD 06/21/2017, 2:32 PM [facility] [phone] or if no answer [phone]

## 2017-06-21 NOTE — Progress Notes (Signed)
Subjective: On vent. Sedated. No trach issues.  Objective: Vital signs in last 24 hours: Temp:  [98.7 F (37.1 C)-99.1 F (37.3 C)] 98.9 F (37.2 C) (06/03 0800) Pulse Rate:  [54-92] 77 (06/03 0800) Resp:  [15-20] 15 (06/03 0800) BP: (109-145)/(72-95) 137/93 (06/03 0800) SpO2:  [100 %] 100 % (06/03 0805) FiO2 (%):  [40 %] 40 % (06/03 0800) Weight:  [359 lb 12.7 oz (163.2 kg)] 359 lb 12.7 oz (163.2 kg) (06/03 0500)  Exam: General:morbidly obese male, sedated Eyes:PERRL, clear conjunctiva, EOMcould not be tested. Ears: Normal auricles and EACs. Nose: Normal mucosa, septum, and turbinates. Mouth: Normal mucosa. No lesion. Neck: Severely obese. Trach midline and in place. No bleeding. Respiratory:On vent.   Recent Labs    06/20/17 0428 06/21/17 0447  WBC 6.4 6.5  HGB 9.5* 9.0*  HCT 33.8* 31.2*  PLT 291 269   Recent Labs    06/20/17 0428 06/21/17 0447  NA 145 141  K 3.4* 3.5  CL 108 105  CO2 29 31  GLUCOSE 126* 137*  BUN 27* 29*  CREATININE 0.89 0.95  CALCIUM 8.5* 8.5*    Medications:  I have reviewed the patient's current medications. Scheduled: . chlorhexidine gluconate (MEDLINE KIT)  15 mL Mouth Rinse BID  . Chlorhexidine Gluconate Cloth  6 each Topical Daily  . clonazePAM  1 mg Oral BID  . docusate  100 mg Per Tube BID  . enoxaparin (LOVENOX) injection  40 mg Subcutaneous Q24H  . feeding supplement (PRO-STAT SUGAR FREE 64)  60 mL Per Tube BID  . insulin aspart  3-9 Units Subcutaneous Q4H  . insulin glargine  10 Units Subcutaneous QHS  . ipratropium-albuterol  3 mL Nebulization Q6H  . LORazepam  2 mg Per Tube Q8H  . mouth rinse  15 mL Mouth Rinse 10 times per day  . QUEtiapine  100 mg Oral BID  . sodium chloride flush  3 mL Intravenous Q12H  . torsemide  10 mg Oral Daily   Continuous: . sodium chloride 10 mL/hr at 06/20/17 1800  . dexmedetomidine (PRECEDEX) IV infusion 1.1 mcg/kg/hr (06/21/17 0858)  . famotidine (PEPCID) IV Stopped (06/20/17  2255)  . feeding supplement (VITAL HIGH PROTEIN) 50 mL/hr at 06/20/17 1800    Assessment/Plan: POD #4 s/p trach.  Venting well via trach. No bleeding. - Wean vent support as tolerated. - Continue fresh trach care. - Will change trach in approximately 2 weeks.   LOS: 12 days   Timothy Le W Timothy Le 06/21/2017, 9:06 AM

## 2017-06-21 NOTE — Plan of Care (Signed)
  Problem: Health Behavior/Discharge Planning: Goal: Ability to manage health-related needs will improve Outcome: Progressing   Problem: Clinical Measurements: Goal: Ability to maintain clinical measurements within normal limits will improve Outcome: Progressing Goal: Will remain free from infection Outcome: Progressing Goal: Diagnostic test results will improve Outcome: Progressing Goal: Respiratory complications will improve Outcome: Progressing Goal: Cardiovascular complication will be avoided Outcome: Progressing   Problem: Activity: Goal: Risk for activity intolerance will decrease Outcome: Progressing   Problem: Nutrition: Goal: Adequate nutrition will be maintained Outcome: Progressing   Problem: Coping: Goal: Level of anxiety will decrease Outcome: Progressing   Problem: Elimination: Goal: Will not experience complications related to bowel motility Outcome: Progressing Goal: Will not experience complications related to urinary retention Outcome: Progressing   Problem: Pain Managment: Goal: General experience of comfort will improve Outcome: Progressing   Problem: Skin Integrity: Goal: Risk for impaired skin integrity will decrease Outcome: Progressing   Problem: Activity: Goal: Ability to tolerate increased activity will improve Outcome: Progressing   Problem: Respiratory: Goal: Ability to maintain a clear airway and adequate ventilation will improve Outcome: Progressing   Problem: Role Relationship: Goal: Method of communication will improve Outcome: Progressing

## 2017-06-21 NOTE — Progress Notes (Signed)
FPTS Social Note  Family medicine teaching service following socially. Will resume care when stable for transfer.  Bufford Lope, DO 06/21/2017, 8:18 AM PGY-2, [place] Service pager [phone]

## 2017-06-21 NOTE — Consult Note (Signed)
[facility] Nurse wound consult note Reason for Consult: sacral wound Wound type: Unstageable. Related to fecal incontinence (Flexiseal fecal containment system in use), moisture, friction, shear, obesity. Pressure Injury POA: No Measurement: The gluteal fold area has a superficial wound with a 100% pink, moist wound base that presents as partial thickness impairment. Within this superficial area and deep in his gluteal fold at the coccyx and along the sides of the buttocks where they press together, there is an area of grey/yellow slough that feels thick.  There is a heavy odor in the room, but I am unable to determine if the wound is the sole cause of the odor.  According to his primary RN, the patient has not had unexplained fevers.  This area of slough can be topically treated with Santyl, which I have ordered, but I believe the patient would be best served in debriding and potentially healing this wound, with a surgical consult for debridement.    There are no family present at the time of my assessment in 2M13.  Monitor the wound area(s) for worsening of condition such as: Signs/symptoms of infection,  Increase in size,  Development of or worsening of odor, Development of pain, or increased pain at the affected locations.  Notify the medical team if any of these develop.  Thank you for the consult.  Discussed plan of care with the patient and bedside nurse.  [facility] nurse will not follow at this time.  Please re-consult the [facility] team if needed.  Val Riles, RN, MSN, CWOCN, CNS-BC, pager [phone]

## 2017-06-21 NOTE — Progress Notes (Signed)
Placed patient on trach collar at 40%. Patient respiratory rate increased to 50. Placed patient back on previous vent settings.

## 2017-06-22 ENCOUNTER — Encounter (HOSPITAL_COMMUNITY): Payer: Self-pay | Admitting: General Surgery

## 2017-06-22 DIAGNOSIS — J9691 Respiratory failure, unspecified with hypoxia: Secondary | ICD-10-CM

## 2017-06-22 DIAGNOSIS — J80 Acute respiratory distress syndrome: Secondary | ICD-10-CM | POA: Diagnosis present

## 2017-06-22 DIAGNOSIS — G4733 Obstructive sleep apnea (adult) (pediatric): Secondary | ICD-10-CM

## 2017-06-22 DIAGNOSIS — J441 Chronic obstructive pulmonary disease with (acute) exacerbation: Secondary | ICD-10-CM

## 2017-06-22 LAB — CBC WITH DIFFERENTIAL/PLATELET
ABS IMMATURE GRANULOCYTES: 0.1 10*3/uL (ref 0.0–0.1)
Basophils Absolute: 0 10*3/uL (ref 0.0–0.1)
Basophils Relative: 0 %
EOS ABS: 0.3 10*3/uL (ref 0.0–0.7)
Eosinophils Relative: 3 %
HCT: 34.9 % — ABNORMAL LOW (ref 39.0–52.0)
Hemoglobin: 10 g/dL — ABNORMAL LOW (ref 13.0–17.0)
Immature Granulocytes: 1 %
Lymphocytes Relative: 19 %
Lymphs Abs: 1.7 10*3/uL (ref 0.7–4.0)
MCH: 25.6 pg — AB (ref 26.0–34.0)
MCHC: 28.7 g/dL — ABNORMAL LOW (ref 30.0–36.0)
MCV: 89.5 fL (ref 78.0–100.0)
MONO ABS: 0.6 10*3/uL (ref 0.1–1.0)
MONOS PCT: 7 %
Neutro Abs: 6.3 10*3/uL (ref 1.7–7.7)
Neutrophils Relative %: 70 %
Platelets: 298 10*3/uL (ref 150–400)
RBC: 3.9 MIL/uL — ABNORMAL LOW (ref 4.22–5.81)
RDW: 17.4 % — AB (ref 11.5–15.5)
WBC: 8.9 10*3/uL (ref 4.0–10.5)

## 2017-06-22 LAB — GLUCOSE, CAPILLARY
GLUCOSE-CAPILLARY: 109 mg/dL — AB (ref 65–99)
GLUCOSE-CAPILLARY: 138 mg/dL — AB (ref 65–99)
GLUCOSE-CAPILLARY: 138 mg/dL — AB (ref 65–99)
Glucose-Capillary: 127 mg/dL — ABNORMAL HIGH (ref 65–99)
Glucose-Capillary: 127 mg/dL — ABNORMAL HIGH (ref 65–99)

## 2017-06-22 MED ORDER — QUETIAPINE FUMARATE 100 MG PO TABS
100.0000 mg | ORAL_TABLET | Freq: Two times a day (BID) | ORAL | Status: DC
  Administered 2017-06-22 – 2017-07-03 (×21): 100 mg
  Filled 2017-06-22 (×23): qty 1

## 2017-06-22 MED ORDER — NYSTATIN 100000 UNIT/ML MT SUSP
5.0000 mL | Freq: Four times a day (QID) | OROMUCOSAL | Status: AC
  Administered 2017-06-22 – 2017-06-27 (×18): 500000 [IU] via ORAL
  Filled 2017-06-22 (×20): qty 5

## 2017-06-22 MED ORDER — TORSEMIDE 20 MG PO TABS
10.0000 mg | ORAL_TABLET | Freq: Every day | ORAL | Status: DC
  Administered 2017-06-22 – 2017-07-15 (×23): 10 mg
  Filled 2017-06-22 (×24): qty 1

## 2017-06-22 MED ORDER — CLONAZEPAM 0.5 MG PO TABS
1.0000 mg | ORAL_TABLET | Freq: Two times a day (BID) | ORAL | Status: DC
  Administered 2017-06-22 – 2017-06-30 (×15): 1 mg
  Filled 2017-06-22 (×15): qty 2

## 2017-06-22 MED ORDER — ENOXAPARIN SODIUM 80 MG/0.8ML ~~LOC~~ SOLN
80.0000 mg | SUBCUTANEOUS | Status: AC
  Administered 2017-06-22 – 2017-06-30 (×9): 80 mg via SUBCUTANEOUS
  Filled 2017-06-22 (×9): qty 0.8

## 2017-06-22 MED ORDER — ORAL CARE MOUTH RINSE
15.0000 mL | OROMUCOSAL | Status: DC
  Administered 2017-06-22 – 2017-07-01 (×42): 15 mL via OROMUCOSAL

## 2017-06-22 NOTE — Progress Notes (Signed)
PULMONARY / CRITICAL CARE MEDICINE   Name: Timothy Le MRN: [id number] DOB: April 25, 1960    ADMISSION DATE:  06/09/2017  CHIEF COMPLAINT:  Dyspnea  HISTORY OF PRESENT ILLNESS:   57 y/o male with morbid obesity admitted on 5/22, intubated not long afterwards in the setting of hypercarbia due to obesity, CHF and bronchitis.  Had a tracheostomy on 5/30.  SUBJECTIVE:  Ongoing agitation, kicking, requiring intermittent 4 point restraints precedex a 1 mcg/kg/hr Tmax 101, WBC remains normal but up-trending Failed SBT- no attempt with RT  VITAL SIGNS: BP (!) 129/113   Pulse 79   Temp (!) 101 F (38.3 C) (Axillary) Comment: notified RN [place]  Resp (!) 21   Ht 5\' 6"  (1.676 m)   Wt (!) 356 lb 0.7 oz (161.5 kg)   SpO2 100%   BMI 57.47 kg/m   HEMODYNAMICS:    VENTILATOR SETTINGS: Vent Mode: PRVC FiO2 (%):  [40 %] 40 % Set Rate:  [15 bmp] 15 bmp Vt Set:  [510 mL] 510 mL PEEP:  [8 cmH20] 8 cmH20 Plateau Pressure:  [24 cmH20-26 cmH20] 26 cmH20  INTAKE / OUTPUT: I/O last 3 completed shifts: In: 4118.7 [I.V.:1833.7; NG/GT:2185; IV Piggyback:100] Out: 40 [Urine:4710; Stool:10]  PHYSICAL EXAMINATION: General:  Chronically ill appearing morbidly obese male on MV in NAD HEENT:  Oral thrush, pupils 4/reactive, 7 prox XLT cuffed shiley midline/ sutured Neuro: Awake, f/c, calm, MAE CV: distant, rrr, no mumur PULM: even/non-labored on MV, lungs bilaterally clear/ diminished, tolerating PSV 20/8 on my exam with TV ~500, 15/8 with TV in 300s, tan secretions GI: obese, soft, non-tender, bs active, rectal tube/ foley  Extremities: warm/dry, trace generalized edema  Skin: no rashes  LABS:  BMET Recent Labs  Lab 06/19/17 0401 06/20/17 0428 06/21/17 0447  NA 141 145 141  K 3.2* 3.4* 3.5  CL 107 108 105  CO2 27 29 31   BUN 26* 27* 29*  CREATININE 0.89 0.89 0.95  GLUCOSE 134* 126* 137*    Electrolytes Recent Labs  Lab 06/16/17 0438  06/19/17 0401 06/20/17 0428  06/21/17 0447  CALCIUM 9.0   < > 7.9* 8.5* 8.5*  MG 2.0  --   --   --   --   PHOS 3.2  --   --   --   --    < > = values in this interval not displayed.    CBC Recent Labs  Lab 06/20/17 0428 06/21/17 0447 06/22/17 0403  WBC 6.4 6.5 8.9  HGB 9.5* 9.0* 10.0*  HCT 33.8* 31.2* 34.9*  PLT 291 269 298    Coag's No results for input(s): APTT, INR in the last 168 hours.  Sepsis Markers No results for input(s): LATICACIDVEN, PROCALCITON, O2SATVEN in the last 168 hours.  ABG No results for input(s): PHART, PCO2ART, PO2ART in the last 168 hours.  Liver Enzymes No results for input(s): AST, ALT, ALKPHOS, BILITOT, ALBUMIN in the last 168 hours.  Cardiac Enzymes No results for input(s): TROPONINI, PROBNP in the last 168 hours.  Glucose Recent Labs  Lab 06/21/17 1143 06/21/17 1523 06/21/17 1949 06/21/17 2331 06/22/17 0349 06/22/17 0746  GLUCAP 125* 111* 143* 122* 127* 109*    Imaging No results found.   STUDIES:  07/2016 PFT> no airflow obstruction, severe restriction. 5/23 Echo> LVEF 55-60%, otherwise poor visualization  CULTURES: 5/23 MRSA PCR >> positive 6/4 BCx 2 >> 6/4 sputum cx  >> 6/4 UC >>  ANTIBIOTICS: 5/22 levaquin > 5/24 5/30 ancef x 1 6/4 nystatin  orally  X 5 days  SIGNIFICANT EVENTS:   LINES/TUBES: 5/23 ETT > 5/30 5/23 Foley >> 5/28 R PICC >> 5/30 tracheostomy >  5/31 R cortrak >>  DISCUSSION: 57 y/o male with morbid obesity and CHF who has acute on chronic respiratory failure in the setting of bronchitis and likely CHF.  Now s/p tracheostomy.   ASSESSMENT / PLAN:  PULMONARY A: Acute respiratory failure with hypercarbia Obesity hypoventilation syndrome Acute bronchitis resolve - no acute change in secretions P:   Continue Full MV support, daily SBT as tolerated with goal to ATC  VAP prevention orders Diuresis as below Continue BD CXR in am  CARDIOVASCULAR A:  Chronic diastolic heart failure P:  Tele monitoring, monitoring  QTc - 6/4 was 0.43 Continue daily torsemide.  Last metolazone given on 6/2.   Consider acetazolamide if he develops a metabolic alkalosis with diuresis  RENAL A:   Hypokalemia P:   Diuresis as above Renal panel/ mag/ phos in am Trend UOP/ daily wts/ strict I/Os Replace electrolyte as indicated  GASTROINTESTINAL A:   No acute issues P:   Continue TF per cortrak  Pepcid for GI prophylaxis  HEMATOLOGIC A:   Anemia- improving  P:  Trend CBC Lovenox dose adjusted for wt for VTE ppx/ SCDs Transfuse PRBC for Hgb < 7 gm/dL  INFECTIOUS A:   Acute bronchitis resolved Fever 6/4, WBC normal but trending up-  Possible source is sacral decub P:   Pan-culture Assess PCT in am Follow clinically for now Trend WBC/ fever curve tylenol for fever  Following clinically off antibiotics  ENDOCRINE A:   Hyperglycemia- improved  P:   Sliding scale insulin per protocol Lantus as ordered  DERM A: Unstageable sacral decub P: Consulted [facility], will come see for possible wound debridement per recs of [facility] RN  NEUROLOGIC A:   Intermittent agitation P:   RASS goal 0 Continue clonazepam and Seroquel- monitor QTc Continue precedex, wean as able Lorazepam prn    FAMILY  - Updates: none bedside 6/4.  Kennieth Rad, AGACNP-BC [facility] Pgr: [phone] or if no answer [phone] 06/22/2017, 11:48 AM

## 2017-06-22 NOTE — Procedures (Signed)
Incision and Drainage Procedure Note  Pre-operative Diagnosis: midline gluteal wound  Post-operative Diagnosis: same  Indications: This is a 57 yo black male with an unstageable gluteal wound that requires debridement.  Anesthesia: not needed  Procedure Details  The procedure, risks and complications have been discussed in detail (including, but not limited bleeding) with the patient's sister, and the patient's sister has given verbal consent to the procedure on the phone.  A #11 blade scalpel was used to be excise necrotic tissue from this wound.  The central most portion of this wound was excised sharply.  Most bleeding was surface ooze and controlled with pressure.  There was a little venous bleeding and pressure was held, but the patient was becoming agitated on his side and therefore, to decrease his agitation on the vent, the decision was made to place a 3-0 silk stitch which achieved hemostasis.  This allowed his wound to then be packed and dressed more quickly and was repositioned back onto his back. The patient was observed until stable.  2.  Tool used for debridement (curette, scapel, etc.)  #11 scalpel  3.  Frequency of surgical debridement.   once  4.  Measurement of total devitalized tissue (wound surface) before and after surgical debridement.   Pre-surgery: 7x5cm Post surgery 7x5x1.5cm  5.  Area and depth of devitalized tissue removed from wound.  2x2x1.5cm  6.  Blood loss and description of tissue removed.  20cc of blood loss.  Tissue removed was devitalized necrotic tissue  7.  Evidence of the progress of the wound's response to treatment.  A.  Current wound volume (current dimensions and depth).  7x5x1.5cm  B.  Presence (and extent of) of infection.  There still remains some necrotic tissue, but no overt evidence of purulent drainage  C.  Presence (and extent of) of non viable tissue.  About 50% of wound is still non viable tissue  D.  Other material in the wound that  is expected to inhibit healing.  none  8.  Was there any viable tissue removed (measurements): no viable tissue removed   Condition: Tolerated procedure well and Stable  Complications: none.  Plan: The remaining tissue seems fairly superficial tissue loss.  Further debridement deferred secondary to some bleeding with making a small laceration in the necrotic tissue to test depth and likelihood of being able to further debride.  Will start with NS WD dressing change BID with santyl and hydrotherapy daily to clean the wound up.    Henreitta Cea 3:56 PM 06/22/2017

## 2017-06-22 NOTE — Care Management Note (Signed)
Case Management Note  Patient Details  Name: Timothy Le MRN: [id number] Date of Birth: Oct 30, 1960  Subjective/Objective:   Pt admitted with SOB                 Action/Plan:   PTA from home.  Pt is ventilated via tach requiring continuous sedation.   I&D performed today.  No family at bedside - sister reachable via phone,  CM will continue to follow for discharge needs - CSW consulted for probable placement   Expected Discharge Date:                  Expected Discharge Plan:     In-House Referral:  Clinical Social Work  Discharge planning Services  CM Consult  Post Acute Care Choice:    Choice offered to:     DME Arranged:    DME Agency:     HH Arranged:    [place] Agency:     Status of Service:     If discussed at [employer], dates discussed:    Additional Comments:  Maryclare Labrador, RN 06/22/2017, 3:51 PM

## 2017-06-22 NOTE — Progress Notes (Addendum)
Family Medicine Social Note  Appreciate excellent care by PCCM. Family medicine to resume care when patient medically stable for transfer out of ICU.  Guadalupe Dawn MD PGY-1 Family Medicine Resident

## 2017-06-22 NOTE — Consult Note (Signed)
SAI ZINN March 26, 1960  [id number].    Requesting MD: Dr. Kara Mead Chief Complaint/Reason for Consult: unstageable sacral decubitus ulcer  HPI:  This is a 57 yo morbidly obese, noncompliant, black male with a history of CHF, COPD, HTN, OSA, who presented on 5-22 with respiratory failure secondary to CHF.  He has subsequently required intubation and now ventilatory management via tracheostomy.  Apparently at home, he mostly sat on his couch and did not mobilize much.  He has an unstageable ulcer between his glutei.  We have been asked to see him for possible debridement.  He did not contribute to his HPI as he is on the vent as well as precedex.  ROS: ROS: Unable to obtain secondary to vented status.  Family History  Problem Relation Age of Onset  . Asthma Sister   . Asthma Other        nephew  . Hypertension Sister   . Diabetes type II Sister     Past Medical History:  Diagnosis Date  . Asthma   . COPD (chronic obstructive pulmonary disease) ([place])   . Diabetes mellitus without complication ([place])   . Hypertension   . Shortness of breath dyspnea   . Sleep apnea     Past Surgical History:  Procedure Laterality Date  . TRACHEOSTOMY TUBE PLACEMENT N/A 06/17/2017   Procedure: TRACHEOSTOMY;  Surgeon: Leta Baptist, MD;  Location: [facility] OR;  Service: ENT;  Laterality: N/A;    Social History:  reports that he quit smoking about 4 years ago. His smoking use included cigarettes. He started smoking about 33 years ago. He has a 0.25 pack-year smoking history. He has never used smokeless tobacco. He reports that he does not drink alcohol or use drugs.  Allergies: No Known Allergies  Medications Prior to Admission  Medication Sig Dispense Refill  . amLODipine (NORVASC) 10 MG tablet Take 10 mg by mouth daily.    Marland Kitchen atorvastatin (LIPITOR) 10 MG tablet Take 10 mg by mouth daily.    . furosemide (LASIX) 40 MG tablet Take 1 tablet (40 mg total) by mouth 2 (two) times daily. 60 tablet  2  . gemfibrozil (LOPID) 600 MG tablet Take 600 mg by mouth 2 (two) times daily before a meal. Thirty minutes before morning and evening meal.    . guaiFENesin (MUCINEX) 600 MG 12 hr tablet Take 1 tablet (600 mg total) by mouth 2 (two) times daily. 40 tablet 0  . insulin glargine (LANTUS) 100 UNIT/ML injection Inject 0.6 mLs (60 Units total) into the skin daily. 10 mL 11  . lisinopril-hydrochlorothiazide (PRINZIDE,ZESTORETIC) 20-25 MG tablet Take 1 tablet by mouth daily.    Marland Kitchen loratadine (CLARITIN) 10 MG tablet 1 daily for allergy while needed (Patient taking differently: Take 10 mg by mouth daily as needed for allergies. ) 30 tablet 11  . metFORMIN (GLUCOPHAGE) 500 MG tablet Take 1,000 mg by mouth 2 (two) times daily with a meal.    . blood glucose meter kit and supplies Dispense based on patient and insurance preference. Use up to four times daily as directed. (FOR ICD-9 250.00, 250.01). 1 each 0     Physical Exam: Blood pressure 95/80, pulse 76, temperature 99.8 F (37.7 C), temperature source Oral, resp. rate (!) 21, height '5\' 6"'  (1.676 m), weight (!) 161.5 kg (356 lb 0.7 oz), SpO2 100 %. General: chronically ill-appearing morbidly obese black male who is laying in bed on vent via trach HEENT: head is normocephalic, atraumatic.  Sclera are noninjected.  PERRL.  Ears and nose without any masses or lesions.  Mouth is pink.  Trach in place in midline of neck Heart: regular, rate, and rhythm.  Normal s1,s2. No obvious murmurs, gallops, or rubs noted.  Palpable radial and pedal pulses bilaterally Lungs: CTAB, no wheezes, rhonchi, or rales noted.  Respiratory effort nonlabored on vent. Abd: soft, morbidly +BS, no masses.  Reducible umbilical hernia.  Patient does wince when I press on this. Skin: warm and dry with no masses, lesions, or rashes.  Gluteal pressure ulcer as noted below.      Results for orders placed or performed during the hospital encounter of 06/09/17 (from the past 48 hour(s))   Glucose, capillary     Status: Abnormal   Collection Time: 06/20/17  4:18 PM  Result Value Ref Range   Glucose-Capillary 130 (H) 65 - 99 mg/dL   Comment 1 Capillary Specimen    Comment 2 Notify RN   Glucose, capillary     Status: Abnormal   Collection Time: 06/20/17  8:05 PM  Result Value Ref Range   Glucose-Capillary 122 (H) 65 - 99 mg/dL   Comment 1 Capillary Specimen    Comment 2 Notify RN   Glucose, capillary     Status: Abnormal   Collection Time: 06/20/17 11:16 PM  Result Value Ref Range   Glucose-Capillary 127 (H) 65 - 99 mg/dL   Comment 1 Capillary Specimen    Comment 2 Notify RN   Glucose, capillary     Status: Abnormal   Collection Time: 06/21/17  3:54 AM  Result Value Ref Range   Glucose-Capillary 124 (H) 65 - 99 mg/dL   Comment 1 Capillary Specimen    Comment 2 Notify RN   CBC with Differential/Platelet     Status: Abnormal   Collection Time: 06/21/17  4:47 AM  Result Value Ref Range   WBC 6.5 4.0 - 10.5 K/uL   RBC 3.52 (L) 4.22 - 5.81 MIL/uL   Hemoglobin 9.0 (L) 13.0 - 17.0 g/dL   HCT 31.2 (L) 39.0 - 52.0 %   MCV 88.6 78.0 - 100.0 fL   MCH 25.6 (L) 26.0 - 34.0 pg   MCHC 28.8 (L) 30.0 - 36.0 g/dL   RDW 17.4 (H) 11.5 - 15.5 %   Platelets 269 150 - 400 K/uL   Neutrophils Relative % 67 %   Neutro Abs 4.4 1.7 - 7.7 K/uL   Lymphocytes Relative 21 %   Lymphs Abs 1.3 0.7 - 4.0 K/uL   Monocytes Relative 8 %   Monocytes Absolute 0.5 0.1 - 1.0 K/uL   Eosinophils Relative 3 %   Eosinophils Absolute 0.2 0.0 - 0.7 K/uL   Basophils Relative 0 %   Basophils Absolute 0.0 0.0 - 0.1 K/uL   Immature Granulocytes 1 %   Abs Immature Granulocytes 0.1 0.0 - 0.1 K/uL    Comment: Performed at [facility], [address], [place], [place] [address]  BMET in AM     Status: Abnormal   Collection Time: 06/21/17  4:47 AM  Result Value Ref Range   Sodium 141 135 - 145 mmol/L   Potassium 3.5 3.5 - 5.1 mmol/L   Chloride 105 101 - 111 mmol/L   CO2 31 22 - 32 mmol/L    Glucose, Bld 137 (H) 65 - 99 mg/dL   BUN 29 (H) 6 - 20 mg/dL   Creatinine, Ser 0.95 0.61 - 1.24 mg/dL   Calcium 8.5 (L) 8.9 -  10.3 mg/dL   GFR calc non Af Amer >60 >60 mL/min   GFR calc Af Amer >60 >60 mL/min    Comment: (NOTE) The eGFR has been calculated using the CKD EPI equation. This calculation has not been validated in all clinical situations. eGFR's persistently <60 mL/min signify possible Chronic Kidney Disease.    Anion gap 5 5 - 15    Comment: Performed at [facility] [address], [place], [place] [address]  Glucose, capillary     Status: Abnormal   Collection Time: 06/21/17  7:56 AM  Result Value Ref Range   Glucose-Capillary 130 (H) 65 - 99 mg/dL   Comment 1 Capillary Specimen    Comment 2 Notify RN   Glucose, capillary     Status: Abnormal   Collection Time: 06/21/17 11:43 AM  Result Value Ref Range   Glucose-Capillary 125 (H) 65 - 99 mg/dL   Comment 1 Capillary Specimen    Comment 2 Notify RN   Glucose, capillary     Status: Abnormal   Collection Time: 06/21/17  3:23 PM  Result Value Ref Range   Glucose-Capillary 111 (H) 65 - 99 mg/dL   Comment 1 Capillary Specimen    Comment 2 Notify RN   Glucose, capillary     Status: Abnormal   Collection Time: 06/21/17  7:49 PM  Result Value Ref Range   Glucose-Capillary 143 (H) 65 - 99 mg/dL   Comment 1 Capillary Specimen    Comment 2 Notify RN   Glucose, capillary     Status: Abnormal   Collection Time: 06/21/17 11:31 PM  Result Value Ref Range   Glucose-Capillary 122 (H) 65 - 99 mg/dL   Comment 1 Capillary Specimen    Comment 2 Notify RN   Glucose, capillary     Status: Abnormal   Collection Time: 06/22/17  3:49 AM  Result Value Ref Range   Glucose-Capillary 127 (H) 65 - 99 mg/dL   Comment 1 Capillary Specimen    Comment 2 Notify RN   CBC with Differential/Platelet     Status: Abnormal   Collection Time: 06/22/17  4:03 AM  Result Value Ref Range   WBC 8.9 4.0 - 10.5 K/uL   RBC 3.90 (L) 4.22 - 5.81  MIL/uL   Hemoglobin 10.0 (L) 13.0 - 17.0 g/dL   HCT 34.9 (L) 39.0 - 52.0 %   MCV 89.5 78.0 - 100.0 fL   MCH 25.6 (L) 26.0 - 34.0 pg   MCHC 28.7 (L) 30.0 - 36.0 g/dL   RDW 17.4 (H) 11.5 - 15.5 %   Platelets 298 150 - 400 K/uL   Neutrophils Relative % 70 %   Neutro Abs 6.3 1.7 - 7.7 K/uL   Lymphocytes Relative 19 %   Lymphs Abs 1.7 0.7 - 4.0 K/uL   Monocytes Relative 7 %   Monocytes Absolute 0.6 0.1 - 1.0 K/uL   Eosinophils Relative 3 %   Eosinophils Absolute 0.3 0.0 - 0.7 K/uL   Basophils Relative 0 %   Basophils Absolute 0.0 0.0 - 0.1 K/uL   Immature Granulocytes 1 %   Abs Immature Granulocytes 0.1 0.0 - 0.1 K/uL    Comment: Performed at [facility], [address] [place], [place] [address]  Glucose, capillary     Status: Abnormal   Collection Time: 06/22/17  7:46 AM  Result Value Ref Range   Glucose-Capillary 109 (H) 65 - 99 mg/dL   Comment 1 Capillary Specimen  Comment 2 Notify RN   Glucose, capillary     Status: Abnormal   Collection Time: 06/22/17 11:00 AM  Result Value Ref Range   Glucose-Capillary 138 (H) 65 - 99 mg/dL   Comment 1 Capillary Specimen    Comment 2 Notify RN   Culture, respiratory (NON-Expectorated)     Status: None (Preliminary result)   Collection Time: 06/22/17 11:42 AM  Result Value Ref Range   Specimen Description TRACHEAL ASPIRATE    Special Requests Normal    Gram Stain      FEW WBC PRESENT, PREDOMINANTLY PMN ABUNDANT GRAM POSITIVE RODS Performed at [facility], [address], [place], [place] [address]    Culture PENDING    Report Status PENDING    No results found.    Assessment/Plan Gluteal pressure wound, unstageable -will plan for bedside debridement of some of this wound as able. -will try to culture this, but no frank purulence identified.  Suspect cx will be mostly skin colonization. -doubt this is source of fevers after debridement as this is not very deep and has good blood flow with no purulent drainage  identified. -will cont santyl and start BID dressing changes.  Some of the necrotic tissue is very shallow and no further debridement was done to avoid more bleeding.  PT hydrotherapy has been ordered as this will likely help clean this up well.  VDRF CHF Morbid obesity HTN OSA  Henreitta Cea, [facility] 06/22/2017, 3:34 PM Pager: [phone]

## 2017-06-22 NOTE — Progress Notes (Signed)
Subjective: Venting via trach. Sedated.  Objective: Vital signs in last 24 hours: Temp:  [98.6 F (37 C)-101 F (38.3 C)] 99.8 F (37.7 C) (06/04 1106) Pulse Rate:  [61-116] 71 (06/04 1135) Resp:  [15-25] 15 (06/04 1135) BP: (113-146)/(74-124) 127/83 (06/04 1135) SpO2:  [100 %] 100 % (06/04 1135) FiO2 (%):  [40 %] 40 % (06/04 1135) Weight:  [356 lb 0.7 oz (161.5 kg)] 356 lb 0.7 oz (161.5 kg) (06/04 0407)  Exam: General:morbidly obese male, sedated Eyes:PERRL, clear conjunctiva, EOMcould not be tested. Ears: Normal auricles and EACs. Nose: Normal mucosa, septum, and turbinates. Mouth: Normal mucosa. No lesion. Neck: Severely obese.Trach midline and in place. No bleeding. Respiratory:On vent.   Recent Labs    06/21/17 0447 06/22/17 0403  WBC 6.5 8.9  HGB 9.0* 10.0*  HCT 31.2* 34.9*  PLT 269 298   Recent Labs    06/20/17 0428 06/21/17 0447  NA 145 141  K 3.4* 3.5  CL 108 105  CO2 29 31  GLUCOSE 126* 137*  BUN 27* 29*  CREATININE 0.89 0.95  CALCIUM 8.5* 8.5*    Medications:  I have reviewed the patient's current medications. Scheduled: . chlorhexidine gluconate (MEDLINE KIT)  15 mL Mouth Rinse BID  . Chlorhexidine Gluconate Cloth  6 each Topical Daily  . clonazePAM  1 mg Per Tube BID  . collagenase   Topical Daily  . docusate  100 mg Per Tube BID  . enoxaparin (LOVENOX) injection  80 mg Subcutaneous Q24H  . famotidine  20 mg Per Tube BID  . feeding supplement (PRO-STAT SUGAR FREE 64)  60 mL Per Tube BID  . insulin aspart  3-9 Units Subcutaneous Q4H  . insulin glargine  10 Units Subcutaneous QHS  . ipratropium-albuterol  3 mL Nebulization Q6H  . mouth rinse  15 mL Mouth Rinse Q4H  . nystatin  5 mL Oral QID  . QUEtiapine  100 mg Per Tube BID  . sodium chloride flush  3 mL Intravenous Q12H  . torsemide  10 mg Per Tube Daily   Continuous: . sodium chloride 10 mL/hr at 06/22/17 0600  . dexmedetomidine (PRECEDEX) IV infusion 1 mcg/kg/hr (06/22/17  1054)  . feeding supplement (VITAL HIGH PROTEIN) 50 mL/hr at 06/22/17 0600    Assessment/Plan: POD #5 s/p trach. Venting well via trach. No bleeding. - Wean vent support as tolerated. - Continue routine trach care. - Will change trach in approximately 2 weeks.   LOS: 13 days   Xin Klawitter W Tawonna Esquer 06/22/2017, 11:45 AM

## 2017-06-23 ENCOUNTER — Inpatient Hospital Stay (HOSPITAL_COMMUNITY): Payer: Medicaid Other

## 2017-06-23 DIAGNOSIS — G4733 Obstructive sleep apnea (adult) (pediatric): Secondary | ICD-10-CM

## 2017-06-23 DIAGNOSIS — J441 Chronic obstructive pulmonary disease with (acute) exacerbation: Secondary | ICD-10-CM

## 2017-06-23 DIAGNOSIS — J9691 Respiratory failure, unspecified with hypoxia: Secondary | ICD-10-CM

## 2017-06-23 LAB — RENAL FUNCTION PANEL
ALBUMIN: 2.1 g/dL — AB (ref 3.5–5.0)
Anion gap: 6 (ref 5–15)
BUN: 31 mg/dL — AB (ref 6–20)
CALCIUM: 8.4 mg/dL — AB (ref 8.9–10.3)
CO2: 33 mmol/L — ABNORMAL HIGH (ref 22–32)
Chloride: 101 mmol/L (ref 101–111)
Creatinine, Ser: 0.95 mg/dL (ref 0.61–1.24)
GFR calc Af Amer: >60 mL/min (ref >60)
GLUCOSE: 145 mg/dL — AB (ref 65–99)
PHOSPHORUS: 3.6 mg/dL (ref 2.5–4.6)
Potassium: 3.3 mmol/L — ABNORMAL LOW (ref 3.5–5.1)
SODIUM: 140 mmol/L (ref 135–145)

## 2017-06-23 LAB — CBC
HCT: 32 % — ABNORMAL LOW (ref 39.0–52.0)
Hemoglobin: 9.2 g/dL — ABNORMAL LOW (ref 13.0–17.0)
MCH: 25.5 pg — AB (ref 26.0–34.0)
MCHC: 28.8 g/dL — AB (ref 30.0–36.0)
MCV: 88.6 fL (ref 78.0–100.0)
PLATELETS: 239 10*3/uL (ref 150–400)
RBC: 3.61 MIL/uL — ABNORMAL LOW (ref 4.22–5.81)
RDW: 17.2 % — AB (ref 11.5–15.5)
WBC: 7.6 10*3/uL (ref 4.0–10.5)

## 2017-06-23 LAB — GLUCOSE, CAPILLARY
GLUCOSE-CAPILLARY: 110 mg/dL — AB (ref 65–99)
GLUCOSE-CAPILLARY: 146 mg/dL — AB (ref 65–99)
Glucose-Capillary: 127 mg/dL — ABNORMAL HIGH (ref 65–99)
Glucose-Capillary: 120 mg/dL — ABNORMAL HIGH (ref 65–99)
Glucose-Capillary: 126 mg/dL — ABNORMAL HIGH (ref 65–99)
Glucose-Capillary: 134 mg/dL — ABNORMAL HIGH (ref 65–99)

## 2017-06-23 LAB — BLOOD CULTURE ID PANEL (REFLEXED)
Acinetobacter baumannii: NOT DETECTED
CANDIDA GLABRATA: NOT DETECTED
Candida albicans: NOT DETECTED
Candida krusei: NOT DETECTED
Candida parapsilosis: NOT DETECTED
Candida tropicalis: NOT DETECTED
ENTEROBACTER CLOACAE COMPLEX: NOT DETECTED
ENTEROCOCCUS SPECIES: NOT DETECTED
Enterobacteriaceae species: NOT DETECTED
Escherichia coli: NOT DETECTED
Haemophilus influenzae: NOT DETECTED
Klebsiella oxytoca: NOT DETECTED
Klebsiella pneumoniae: NOT DETECTED
Listeria monocytogenes: NOT DETECTED
Neisseria meningitidis: NOT DETECTED
PROTEUS SPECIES: NOT DETECTED
PSEUDOMONAS AERUGINOSA: NOT DETECTED
STAPHYLOCOCCUS AUREUS BCID: NOT DETECTED
STAPHYLOCOCCUS SPECIES: NOT DETECTED
STREPTOCOCCUS AGALACTIAE: NOT DETECTED
STREPTOCOCCUS PNEUMONIAE: NOT DETECTED
Serratia marcescens: NOT DETECTED
Streptococcus pyogenes: NOT DETECTED
Streptococcus species: NOT DETECTED

## 2017-06-23 LAB — URINE CULTURE
Culture: NO GROWTH
SPECIAL REQUESTS: NORMAL

## 2017-06-23 LAB — MAGNESIUM: MAGNESIUM: 1.6 mg/dL — AB (ref 1.7–2.4)

## 2017-06-23 LAB — PROCALCITONIN: PROCALCITONIN: 0.12 ng/mL

## 2017-06-23 MED ORDER — CHLORHEXIDINE GLUCONATE CLOTH 2 % EX PADS
6.0000 | MEDICATED_PAD | Freq: Every day | CUTANEOUS | Status: DC
  Administered 2017-06-23: 6 via TOPICAL

## 2017-06-23 MED ORDER — MUPIROCIN 2 % EX OINT
1.0000 "application " | TOPICAL_OINTMENT | Freq: Two times a day (BID) | CUTANEOUS | Status: AC
  Administered 2017-06-23 – 2017-06-28 (×10): 1 via NASAL
  Filled 2017-06-23 (×2): qty 22

## 2017-06-23 MED ORDER — POTASSIUM CHLORIDE 20 MEQ/15ML (10%) PO SOLN
40.0000 meq | Freq: Once | ORAL | Status: AC
  Administered 2017-06-23: 40 meq
  Filled 2017-06-23: qty 30

## 2017-06-23 NOTE — Progress Notes (Signed)
Family medicine social note  Still on trach vent. S/P ulcer debridement by surgery. Family medicine will continue to follow socially and be ready to accept patient when ready to come out of ICU.  Guadalupe Dawn MD PGY-1 Family Medicine Resident

## 2017-06-23 NOTE — Progress Notes (Signed)
CSW has reached out to pt's sister [name] and left VM asking that she call CSW back for assessment of pt.   Timothy Le, MSW, [facility] Clinical Social Worker [phone]

## 2017-06-23 NOTE — Consult Note (Signed)
[facility] Nurse wound consult note Reason for Consult: Trach face plate wound Wound type: Medical Device Related Pressure Injury Pressure Injury POA: No Measurement: Unable to be measured secondary to faceplate sutures remain intact. Wound bed: As much as can be seen, assessment impaired by size of the neck and faceplate sutures, is clean, pink and comprises a full thickness injury.  I cannot visualize all aspects, but it seems it is definitely a stage 2, possibly a stage 3 PI. Drainage (amount, consistency, odor) Dried blood to peri-trach cannula area. Periwound: Intact, normal color skin as much as I can see. Dressing procedure/placement/frequency:  If at all possible, insert a foam dressing under the faceplate.  Change every 3 days and prn. Monitor the wound area(s) for worsening of condition such as: Signs/symptoms of infection,  Increase in size,  Development of or worsening of odor, Development of pain, or increased pain at the affected locations.  Notify the medical team if any of these develop.  Thank you for the consult.  Discussed plan of care with the patient and bedside nurse.  [facility] nurse will not follow at this time.  Please re-consult the [facility] team if needed.  Val Riles, RN, MSN, CWOCN, CNS-BC, pager [phone]

## 2017-06-23 NOTE — Progress Notes (Signed)
Subjective: Pt awake and responsive. Still on vent via trach.   Objective: Vital signs in last 24 hours: Temp:  [98.5 F (36.9 C)-100 F (37.8 C)] 98.9 F (37.2 C) (06/05 0759) Pulse Rate:  [60-97] 79 (06/05 0821) Resp:  [14-28] 17 (06/05 0821) BP: (95-156)/(72-113) 140/85 (06/05 0821) SpO2:  [90 %-100 %] 100 % (06/05 0821) FiO2 (%):  [40 %] 40 % (06/05 0821) Weight:  [355 lb 2.6 oz (161.1 kg)] 355 lb 2.6 oz (161.1 kg) (06/05 0446)  Exam: General:morbidly obese male. No distress. Eyes:PERRL, clear conjunctiva. Ears: Normal auricles and EACs. Nose: Normal mucosa, septum, and turbinates. Mouth: Normal mucosa. No lesion. Neck: Severely obese.Trach midline and in place. No bleeding. Respiratory:On vent.   Recent Labs    06/22/17 0403 06/23/17 0449  WBC 8.9 7.6  HGB 10.0* 9.2*  HCT 34.9* 32.0*  PLT 298 239   Recent Labs    06/21/17 0447 06/23/17 0449  NA 141 140  K 3.5 3.3*  CL 105 101  CO2 31 33*  GLUCOSE 137* 145*  BUN 29* 31*  CREATININE 0.95 0.95  CALCIUM 8.5* 8.4*    Medications:  I have reviewed the patient's current medications. Scheduled: . chlorhexidine gluconate (MEDLINE KIT)  15 mL Mouth Rinse BID  . Chlorhexidine Gluconate Cloth  6 each Topical Daily  . clonazePAM  1 mg Per Tube BID  . collagenase   Topical Daily  . docusate  100 mg Per Tube BID  . enoxaparin (LOVENOX) injection  80 mg Subcutaneous Q24H  . famotidine  20 mg Per Tube BID  . feeding supplement (PRO-STAT SUGAR FREE 64)  60 mL Per Tube BID  . insulin aspart  3-9 Units Subcutaneous Q4H  . insulin glargine  10 Units Subcutaneous QHS  . ipratropium-albuterol  3 mL Nebulization Q6H  . mouth rinse  15 mL Mouth Rinse Q4H  . nystatin  5 mL Oral QID  . QUEtiapine  100 mg Per Tube BID  . sodium chloride flush  3 mL Intravenous Q12H  . torsemide  10 mg Per Tube Daily   Continuous: . sodium chloride 250 mL (06/23/17 0741)  . dexmedetomidine (PRECEDEX) IV infusion 0.7 mcg/kg/hr  (06/23/17 0800)  . feeding supplement (VITAL HIGH PROTEIN) 50 mL/hr at 06/23/17 0600    Assessment/Plan: POD #6s/p trach. Venting well via trach. No bleeding. - Continue to wean vent support as tolerated. -Continue routine trach care. - Will change trach late next week.     LOS: 14 days   Kaden Dunkel W Jaislyn Blinn 06/23/2017, 8:59 AM

## 2017-06-23 NOTE — Clinical Social Work Note (Signed)
Clinical Social Work Assessment  Patient Details  Name: Timothy Le MRN: [id number] Date of Birth: [birth year]  Date of referral:  06/23/17               Reason for consult:  Facility Placement                Permission sought to share information with:    Permission granted to share information::  Yes, Release of Information Signed  Name::     [name] "[name]" Settle  Agency::  family  Relationship::  sister  Contact Information:  Ronalee Belts [phone]  Housing/Transportation Living arrangements for the past 2 months:  Single Family Home(alone. ) Source of Information:  Siblings(pt's sister ) Patient Interpreter Needed:  None Criminal Activity/Legal Involvement Pertinent to Current Situation/Hospitalization:  No - Comment as needed Significant Relationships:  Other Family Members, Siblings Lives with:  Self Do you feel safe going back to the place where you live?  Yes Need for family participation in patient care:  Yes (Comment)  Care giving concerns:  CSW spoke with sister via phone as pt is on vent and intubated. Pt's sister verbalized concerns around pt's care and what is to come after pt is discharged from the hospital. Sister also expressed concerns about pt's wife being abel to come back into the picture and make medical decisions for pt.    Social Worker assessment / plan:  CSW spoke with sister via phone. CSW was informed that pt is from home alone with little to no help. Pt's sister reports that pt has support from sister and her daughter. Pt's sister verbalized that pt's son and ex wife only come around for pt when pt gets check each month. Sister expressed that a DSS worker has already been involved for this matter and spoke with pt informing pt that pt DOES NOT have to keep giving money to them and should stop. Sister expressed that she stays on pt about doing better, however pt will not ask for help even when needed.   Pt's sister verbalized being agreeable to  vent/snf placement if needed at the time of discharge.   Employment status:  Other (Comment)(unknown at this time. ) Insurance information:  Medicaid In [place] PT Recommendations:  Not assessed at this time Information / Referral to community resources:  Skilled Nursing Facility(spoke with sister about possible placement options. )  Patient/Family's Response to care:  Sisters response to care is understanding and agreeable to plan of care at this time.  Patient/Family's Understanding of and Emotional Response to Diagnosis, Current Treatment, and Prognosis:  No further questions or concerns have been presented to CSW at this time. Emotional support of sister was engaged and wanting to help pt at best.   Emotional Assessment Appearance:  Appears stated age Attitude/Demeanor/Rapport:  Unable to Assess Affect (typically observed):  Unable to Assess(pt trach's on vent. ) Orientation:  (pt trach'd) Alcohol / Substance use:  Not Applicable Psych involvement (Current and /or in the community):  No (Comment)  Discharge Needs  Concerns to be addressed:  Decision making concerns Readmission within the last 30 days:  No Current discharge risk:  Lack of support system, Lives alone, Other(intubated. ) Barriers to Discharge:  Continued Medical Work up   [employer], [place] 06/23/2017, 11:39 AM

## 2017-06-23 NOTE — Progress Notes (Signed)
PULMONARY / CRITICAL CARE MEDICINE   Name: Timothy Le MRN: [id number] DOB: Jul 12, 1960    ADMISSION DATE:  06/09/2017  CHIEF COMPLAINT:  Dyspnea  HISTORY OF PRESENT ILLNESS:   57 y/o male with morbid obesity admitted on 5/22, intubated not long afterwards in the setting of hypercarbia due to obesity, CHF and bronchitis.  Had a tracheostomy on 5/30.   SUBJECTIVE:  More awake and follows commands on 06/23/2017   VITAL SIGNS: BP 140/85   Pulse 79   Temp 98.9 F (37.2 C) (Oral)   Resp 17   Ht 5\' 6"  (1.676 m)   Wt (!) 161.1 kg (355 lb 2.6 oz)   SpO2 100%   BMI 57.32 kg/m   HEMODYNAMICS: CVP:  [8 mmHg] 8 mmHg  VENTILATOR SETTINGS: Vent Mode: PSV;CPAP FiO2 (%):  [40 %] 40 % Set Rate:  [15 bmp] 15 bmp Vt Set:  [510 mL] 510 mL PEEP:  [8 cmH20] 8 cmH20 Pressure Support:  [15 cmH20-20 cmH20] 15 cmH20 Plateau Pressure:  [20 cmH20-22 cmH20] 22 cmH20  INTAKE / OUTPUT: I/O last 3 completed shifts: In: 3817 [I.V.:1837; NG/GT:1980] Out: 3905 [Urine:3895; Stool:10]  PHYSICAL EXAMINATION:  General: Morbidly obese male who appears disheveled HEENT: MM pink/moist Neuro: Awake alert follows commands CV: Heart sounds are distant PULM: even/non-labored, lungs bilaterally diminished WU:JWJX, non-tender, bsx4 active  Extremities: warm/dry, 4+ edema  Skin: no rashes or lesions   LABS:  BMET Recent Labs  Lab 06/20/17 0428 06/21/17 0447 06/23/17 0449  NA 145 141 140  K 3.4* 3.5 3.3*  CL 108 105 101  CO2 29 31 33*  BUN 27* 29* 31*  CREATININE 0.89 0.95 0.95  GLUCOSE 126* 137* 145*    Electrolytes Recent Labs  Lab 06/20/17 0428 06/21/17 0447 06/23/17 0449  CALCIUM 8.5* 8.5* 8.4*  MG  --   --  1.6*  PHOS  --   --  3.6    CBC Recent Labs  Lab 06/21/17 0447 06/22/17 0403 06/23/17 0449  WBC 6.5 8.9 7.6  HGB 9.0* 10.0* 9.2*  HCT 31.2* 34.9* 32.0*  PLT 269 298 239    Coag's No results for input(s): APTT, INR in the last 168 hours.  Sepsis  Markers Recent Labs  Lab 06/23/17 0449  PROCALCITON 0.12    ABG No results for input(s): PHART, PCO2ART, PO2ART in the last 168 hours.  Liver Enzymes Recent Labs  Lab 06/23/17 0449  ALBUMIN 2.1*    Cardiac Enzymes No results for input(s): TROPONINI, PROBNP in the last 168 hours.  Glucose Recent Labs  Lab 06/22/17 1100 06/22/17 1638 06/22/17 1958 06/22/17 2359 06/23/17 0346 06/23/17 0755  GLUCAP 138* 127* 138* 110* 127* 120*    Imaging Dg Chest Port 1 View  Result Date: 06/23/2017 CLINICAL DATA:  Respiratory failure EXAM: PORTABLE CHEST 1 VIEW COMPARISON:  Jun 17, 2017 FINDINGS: Tracheostomy catheter tip is 6.5 cm above the carina. Feeding tube tip is below the diaphragm. No pneumothorax. There is airspace consolidation left lower lobe with small pleural effusion on the left. There is atelectatic change in the right base. There is cardiomegaly with pulmonary vascularity within normal limits. No evident adenopathy. No bone lesions. IMPRESSION: Tube positions as described without pneumothorax. Airspace consolidation most likely due to pneumonia left lower lobe with small left pleural effusion. Right base atelectasis. Stable cardiac enlargement. Electronically Signed   By: Lowella Grip III M.D.   On: 06/23/2017 08:21     STUDIES:  07/2016 PFT> no airflow obstruction, severe  restriction. 5/23 Echo> LVEF 55-60%, otherwise poor visualization  CULTURES:   ANTIBIOTICS: 5/22 levaquin > 5/24  SIGNIFICANT EVENTS: 06/22/2017 surgical debridement of sacral wound per surgery  LINES/TUBES: 5/23 ETT > 5/30 5/30 tracheostomy >   DISCUSSION: 57 y/o male with morbid obesity and CHF who has acute on chronic respiratory failure in the setting of bronchitis and likely CHF.  Now s/p tracheostomy.  Surgical wound was debrided with surgery on 06/22/2017  ASSESSMENT / PLAN:  PULMONARY A: Acute respiratory failure with hypercarbia Obesity hypoventilation syndrome Acute bronchitis  resolve P:   Wean as tolerated Status post tracheostomy  CARDIOVASCULAR  Intake/Output Summary (Last 24 hours) at 06/23/2017 0913 Last data filed at 06/23/2017 0800 Gross per 24 hour  Intake 2318.32 ml  Output 2165 ml  Net 153.32 ml   A:  Chronic diastolic heart failure P:  Diuresis as tolerated Note he remains positive I&O 153  cc for 24 hours  RENAL Lab Results  Component Value Date   CREATININE 0.95 06/23/2017   CREATININE 0.95 06/21/2017   CREATININE 0.89 06/20/2017   Recent Labs  Lab 06/20/17 0428 06/21/17 0447 06/23/17 0449  K 3.4* 3.5 3.3*    A:   Hypokalemia P:   Follow creatinine Replete electrolytes as needed Magnesium and phosphorus  GASTROINTESTINAL A:   No acute issues P:   GI protection  tube feedings   HEMATOLOGIC Recent Labs    06/22/17 0403 06/23/17 0449  HGB 10.0* 9.2*    A:   Anemia, no bleeding P:  Transfuse per protocol Observe for any type of hemorrhage especially secondary  INFECTIOUS A:   Acute bronchitis resolved P:   Continue to follow off antibiotic  ENDOCRINE CBG (last 3)  Recent Labs    06/22/17 2359 06/23/17 0346 06/23/17 0755  GLUCAP 110* 127* 120*    A:   Mild hyperglycemia P:   Continue sliding scale insulin Lantus insulin Note well-controlled glucose 120  DERM A: Appreciate wound care input, note recommendation that he may need surgical evaluation for decubitus ulcer, debridement P: Debridement of sacral area performed on 06/23/1998 19% to [place] surgery Continue treatment  NEUROLOGIC A:   Intermittent agitation> now sedated P:   Goals RA SS of 0 Continue Klonopin and Seroquel 06/23/2017 awake follows commands moves all extremities commands  FAMILY  - Updates: No family at bedside   App cct 35 min  Richardson Landry Minor ACNP Maryanna Shape PCCM Pager [phone] till 1 pm If no answer page [phone] 06/23/2017, 9:07 AM

## 2017-06-23 NOTE — Progress Notes (Signed)
Physical Therapy Wound Treatment Patient Details  Name: Timothy Le MRN: [id number] Date of Birth: 1961/01/03  Today's Date: 06/23/2017 Time: 1020-1109 Time Calculation (min): 49 min  Subjective  Subjective: Pt awake and alert on vent.  Patient and Family Stated Goals: None stated Prior Treatments: surgical debridement 6/4  Pain Score:    Wound Assessment  Pressure Injury Stage II -  Partial thickness loss of dermis presenting as a shallow open ulcer with a red, pink wound bed without slough. 2 CM x 2CM excoriated  (Active)  Wound Image   06/23/2017 11:20 AM  Dressing Type ABD;Barrier Film (skin prep);Gauze (Comment);Moist to dry 06/23/2017 11:20 AM  Dressing Clean;Dry;Intact 06/23/2017 11:20 AM  Dressing Change Frequency Daily 06/23/2017 11:20 AM  State of Healing Eschar 06/23/2017 11:20 AM  Site / Wound Assessment Red;Black;Yellow 06/23/2017 11:20 AM  % Wound base Red or Granulating 40% 06/23/2017 11:20 AM  % Wound base Yellow/Fibrinous Exudate 20% 06/23/2017 11:20 AM  % Wound base Black/Eschar 40% 06/23/2017 11:20 AM  Peri-wound Assessment Intact 06/23/2017 11:20 AM  Wound Length (cm) 9 cm 06/23/2017 11:20 AM  Wound Width (cm) 8 cm 06/23/2017 11:20 AM  Wound Depth (cm) 1.2 cm 06/23/2017 11:20 AM  Wound Surface Area (cm^2) 72 cm^2 06/23/2017 11:20 AM  Wound Volume (cm^3) 86.4 cm^3 06/23/2017 11:20 AM  Tunneling (cm) 0 06/23/2017 11:20 AM  Undermining (cm) 0 06/23/2017 11:20 AM  Margins Unattached edges (unapproximated) 06/23/2017 11:20 AM  Drainage Amount Moderate 06/23/2017 11:20 AM  Drainage Description Odor;Purulent 06/23/2017 11:20 AM  Treatment Debridement (Selective);Hydrotherapy (Pulse lavage);Packing (Saline gauze) 06/23/2017 11:20 AM   Santyl applied to wound bed prior to applying dressing.     Hydrotherapy Pulsed lavage therapy - wound location: central buttocks Pulsed Lavage with Suction (psi): 12 psi Pulsed Lavage with Suction - Normal Saline Used: 1000 mL Pulsed Lavage Tip: Tip with splash  shield Selective Debridement Selective Debridement - Location: central buttocks Selective Debridement - Tools Used: Forceps;Scalpel Selective Debridement - Tissue Removed: Black and yellow necrotic tissue   Wound Assessment and Plan  Wound Therapy - Assess/Plan/Recommendations Wound Therapy - Clinical Statement: Pt presents to hydrotherapy with an unstagable pressure injury s/p surgical debridement 06/22/17. Pt bleeding easily throughout debridement and treatment was somewhat limited by this. Pt will benefit from continued hydrotherapy for selective removal of unviable tissue, and to promote wound bed healing.  Wound Therapy - Functional Problem List: Acute pain, decreased tolerance for position changes Factors Delaying/Impairing Wound Healing: Diabetes Mellitus;Incontinence;Immobility;Multiple medical problems;Polypharmacy Hydrotherapy Plan: Debridement;Dressing change;Pulsatile lavage with suction;Patient/family education Wound Therapy - Frequency: 6X / week Wound Therapy - Follow Up Recommendations: Skilled nursing facility Wound Plan: See above  Wound Therapy Goals- Improve the function of patient's integumentary system by progressing the wound(s) through the phases of wound healing (inflammation - proliferation - remodeling) by: Decrease Necrotic Tissue to: 20% Decrease Necrotic Tissue - Progress: Goal set today Increase Granulation Tissue to: 80% Increase Granulation Tissue - Progress: Goal set today Goals/treatment plan/discharge plan were made with and agreed upon by patient/family: Yes Time For Goal Achievement: 7 days Wound Therapy - Potential for Goals: Good  Goals will be updated until maximal potential achieved or discharge criteria met.  Discharge criteria: when goals achieved, discharge from hospital, MD decision/surgical intervention, no progress towards goals, refusal/missing three consecutive treatments without notification or medical reason.  GP     Thelma Comp 06/23/2017, 11:30 AM  Rolinda Roan, PT, DPT Acute Rehabilitation Services Pager: [phone]

## 2017-06-23 NOTE — Progress Notes (Signed)
PHARMACY - PHYSICIAN COMMUNICATION CRITICAL VALUE ALERT - BLOOD CULTURE IDENTIFICATION (BCID)  Assessment:  BCID - 1 of 3 bottles with GPC (aerobic). Nothing detected on BCID  E coli in superficial decubitus culture, reincubated, but s/p debridement and MD notes not evidence of active infection  MRSA PCR positive  Name of physician (or Provider) Contacted: Georgann Housekeeper, PA-C  Current antibiotics:  none  Changes to prescribed antibiotics recommended:   Continue to defer antibiotics  CHG/Mupirocin x 5 days added for + MRSA PCR.  Results for orders placed or performed during the hospital encounter of 06/09/17  Blood Culture ID Panel (Reflexed) (Collected: 06/22/2017 12:04 PM)  Result Value Ref Range   Enterococcus species NOT DETECTED NOT DETECTED   Listeria monocytogenes NOT DETECTED NOT DETECTED   Staphylococcus species NOT DETECTED NOT DETECTED   Staphylococcus aureus NOT DETECTED NOT DETECTED   Streptococcus species NOT DETECTED NOT DETECTED   Streptococcus agalactiae NOT DETECTED NOT DETECTED   Streptococcus pneumoniae NOT DETECTED NOT DETECTED   Streptococcus pyogenes NOT DETECTED NOT DETECTED   Acinetobacter baumannii NOT DETECTED NOT DETECTED   Enterobacteriaceae species NOT DETECTED NOT DETECTED   Enterobacter cloacae complex NOT DETECTED NOT DETECTED   Escherichia coli NOT DETECTED NOT DETECTED   Klebsiella oxytoca NOT DETECTED NOT DETECTED   Klebsiella pneumoniae NOT DETECTED NOT DETECTED   Proteus species NOT DETECTED NOT DETECTED   Serratia marcescens NOT DETECTED NOT DETECTED   Haemophilus influenzae NOT DETECTED NOT DETECTED   Neisseria meningitidis NOT DETECTED NOT DETECTED   Pseudomonas aeruginosa NOT DETECTED NOT DETECTED   Candida albicans NOT DETECTED NOT DETECTED   Candida glabrata NOT DETECTED NOT DETECTED   Candida krusei NOT DETECTED NOT DETECTED   Candida parapsilosis NOT DETECTED NOT DETECTED   Candida tropicalis NOT DETECTED NOT DETECTED    Arty Baumgartner, RPh Pager: [phone] 06/23/2017  5:58 PM

## 2017-06-24 ENCOUNTER — Inpatient Hospital Stay (HOSPITAL_COMMUNITY): Payer: Medicaid Other

## 2017-06-24 LAB — CULTURE, RESPIRATORY: SPECIAL REQUESTS: NORMAL

## 2017-06-24 LAB — GLUCOSE, CAPILLARY
GLUCOSE-CAPILLARY: 118 mg/dL — AB (ref 65–99)
GLUCOSE-CAPILLARY: 132 mg/dL — AB (ref 65–99)
Glucose-Capillary: 136 mg/dL — ABNORMAL HIGH (ref 65–99)
Glucose-Capillary: 97 mg/dL (ref 65–99)
Glucose-Capillary: 127 mg/dL — ABNORMAL HIGH (ref 65–99)
Glucose-Capillary: 97 mg/dL (ref 65–99)
Glucose-Capillary: 139 mg/dL — ABNORMAL HIGH (ref 65–99)

## 2017-06-24 LAB — BASIC METABOLIC PANEL
ANION GAP: 7 (ref 5–15)
BUN: 30 mg/dL — ABNORMAL HIGH (ref 6–20)
CO2: 33 mmol/L — AB (ref 22–32)
CREATININE: 0.96 mg/dL (ref 0.61–1.24)
Calcium: 8.8 mg/dL — ABNORMAL LOW (ref 8.9–10.3)
Chloride: 100 mmol/L — ABNORMAL LOW (ref 101–111)
GLUCOSE: 155 mg/dL — AB (ref 65–99)
Potassium: 3.6 mmol/L (ref 3.5–5.1)
Sodium: 140 mmol/L (ref 135–145)

## 2017-06-24 LAB — CBC WITH DIFFERENTIAL/PLATELET
Abs Immature Granulocytes: 0.1 10*3/uL (ref 0.0–0.1)
BASOS PCT: 1 %
Basophils Absolute: 0.1 10*3/uL (ref 0.0–0.1)
EOS PCT: 4 %
Eosinophils Absolute: 0.3 10*3/uL (ref 0.0–0.7)
HEMATOCRIT: 32.5 % — AB (ref 39.0–52.0)
Hemoglobin: 9.1 g/dL — ABNORMAL LOW (ref 13.0–17.0)
IMMATURE GRANULOCYTES: 1 %
Lymphocytes Relative: 23 %
Lymphs Abs: 1.6 10*3/uL (ref 0.7–4.0)
MCH: 25.4 pg — ABNORMAL LOW (ref 26.0–34.0)
MCHC: 28 g/dL — AB (ref 30.0–36.0)
MCV: 90.8 fL (ref 78.0–100.0)
Monocytes Absolute: 0.5 10*3/uL (ref 0.1–1.0)
Monocytes Relative: 8 %
NEUTROS PCT: 63 %
Neutro Abs: 4.2 10*3/uL (ref 1.7–7.7)
PLATELETS: 223 10*3/uL (ref 150–400)
RBC: 3.58 MIL/uL — AB (ref 4.22–5.81)
RDW: 17.3 % — AB (ref 11.5–15.5)
WBC: 6.7 10*3/uL (ref 4.0–10.5)

## 2017-06-24 LAB — CULTURE, RESPIRATORY W GRAM STAIN

## 2017-06-24 LAB — AEROBIC CULTURE  (SUPERFICIAL SPECIMEN)

## 2017-06-24 LAB — MAGNESIUM: Magnesium: 1.7 mg/dL (ref 1.7–2.4)

## 2017-06-24 LAB — AEROBIC CULTURE W GRAM STAIN (SUPERFICIAL SPECIMEN)

## 2017-06-24 LAB — PHOSPHORUS: Phosphorus: 3.8 mg/dL (ref 2.5–4.6)

## 2017-06-24 MED ORDER — ONDANSETRON HCL 4 MG/2ML IJ SOLN
4.0000 mg | Freq: Four times a day (QID) | INTRAMUSCULAR | Status: AC | PRN
  Administered 2017-06-24 – 2017-07-04 (×4): 4 mg via INTRAVENOUS
  Filled 2017-06-24 (×4): qty 2

## 2017-06-24 MED ORDER — POTASSIUM CHLORIDE 20 MEQ/15ML (10%) PO SOLN
20.0000 meq | ORAL | Status: AC
  Administered 2017-06-24 (×2): 20 meq
  Filled 2017-06-24 (×2): qty 15

## 2017-06-24 MED ORDER — LORAZEPAM 2 MG/ML IJ SOLN
0.5000 mg | INTRAMUSCULAR | Status: DC | PRN
  Administered 2017-06-25 – 2017-06-27 (×2): 0.5 mg via INTRAVENOUS
  Filled 2017-06-24 (×2): qty 1

## 2017-06-24 MED ORDER — MAGNESIUM SULFATE 2 GM/50ML IV SOLN
2.0000 g | Freq: Once | INTRAVENOUS | Status: AC
  Administered 2017-06-24: 2 g via INTRAVENOUS
  Filled 2017-06-24: qty 50

## 2017-06-24 MED ORDER — METOLAZONE 2.5 MG PO TABS
2.5000 mg | ORAL_TABLET | Freq: Every day | ORAL | Status: AC
  Administered 2017-06-24: 2.5 mg via ORAL
  Filled 2017-06-24 (×2): qty 1

## 2017-06-24 NOTE — Progress Notes (Signed)
Nutrition Follow-up  DOCUMENTATION CODES:   Morbid obesity  INTERVENTION:   Continue:  Vital High Protein at 50 ml/h (1200 ml per day)  Pro-stat 60 ml BID  Provides 1600 kcal, 165 gm protein, 1003 ml free water daily  NUTRITION DIAGNOSIS:   Inadequate oral intake related to inability to eat as evidenced by NPO status.  Ongoing  GOAL:   Provide needs based on ASPEN/SCCM guidelines  Met with TF  MONITOR:   Vent status, Labs, I & O's  ASSESSMENT:   56 yo male with PMH of morbid obesity, asthma, HTN, sleep apnea, COPD, and DM who was admitted on 5/22 with acute respiratory failure, COPD exacerbation, likely new onset HF. Required intubation shortly after admission.  Discussed patient with RN today. Tolerating TF without difficulty. S/P debridement of gluteal wound in OR on 6/4. Trach in place. Weaned for three hours this morning. Cortrak in place, post-pyloric. Currently receiving Vital High Protein via Cortrak at 50 ml/h (1200 ml/day) with Prostat 60 ml BID to provide 1600 kcals, 165 gm protein, 1003 ml free water daily.  Patient remains on ventilator support Temp (24hrs), Avg:100 F (37.8 C), Min:98.8 F (37.1 C), Max:100.7 F (38.2 C)  Labs reviewed. CBG's: [phone] Medications reviewed and include Colace, Novolog, Lantus, Precedex.   Diet Order:   Diet Order           Diet NPO time specified  Diet effective midnight          EDUCATION NEEDS:   No education needs have been identified at this time  Skin:  Skin Assessment: Skin Integrity Issues: Skin Integrity Issues:: Stage II, Stage III Stage II: buttocks-midline gluteal wound Stage III: neck (medical device related PI from trach)  Last BM:  6/6 (rectal tube)  Height:   Ht Readings from Last 1 Encounters:  06/10/17 5' 6" (1.676 m)    Weight:   Wt Readings from Last 1 Encounters:  06/24/17 (!) 352 lb 15.3 oz (160.1 kg)    Ideal Body Weight:  64.5 kg  BMI:  Body mass index is 56.97  kg/m.  Estimated Nutritional Needs:   Kcal:  1420-1615  Protein:  161 gm  Fluid:  2 L    , RD, LDN, CNSC Pager [phone] After Hours Pager [phone]  

## 2017-06-24 NOTE — Progress Notes (Addendum)
During day shift patient's Coretrak was removed.  Verbal order per Dr. Lamonte Sakai on day shift for NG placement.  Unsuccessful attempt to place NG. Pt vomited and was unable to tolerate NG insertion.  Unable to give select pm medications and tube feed at this time. Plan for Coretrak to be placed in the morning.

## 2017-06-24 NOTE — Progress Notes (Signed)
Family Medicine social note  Appreciate excellent care given by critical care team. Completed course of abx for poss pna, s/p surgical debridement of decubitus ulcer. Family medicine will continue to follow socially and be ready to accept when stable for floor.  Guadalupe Dawn MD PGY-1 Family Medicine Resident

## 2017-06-24 NOTE — Progress Notes (Signed)
PULMONARY / CRITICAL CARE MEDICINE   Name: Timothy Le MRN: [id number] DOB: 1960/08/04    ADMISSION DATE:  06/09/2017  CHIEF COMPLAINT:  Dyspnea  HISTORY OF PRESENT ILLNESS:   57 y/o male with morbid obesity admitted on 5/22, intubated not long afterwards in the setting of hypercarbia due to obesity, CHF and bronchitis.  Had a tracheostomy on 5/30.   SUBJECTIVE:  Weaning Precedex, currently at 1.0 Tolerating pressure support 15 currently   VITAL SIGNS: BP 128/76   Pulse 69   Temp 98.8 F (37.1 C) (Oral)   Resp 17   Ht 5\' 6"  (1.676 m)   Wt (!) 160.1 kg (352 lb 15.3 oz)   SpO2 96%   BMI 56.97 kg/m   HEMODYNAMICS:    VENTILATOR SETTINGS: Vent Mode: PSV;CPAP FiO2 (%):  [40 %] 40 % Set Rate:  [15 bmp] 15 bmp Vt Set:  [510 mL] 510 mL PEEP:  [8 cmH20] 8 cmH20 Pressure Support:  [10 cmH20] 10 cmH20 Plateau Pressure:  [19 cmH20-23 cmH20] 23 cmH20  INTAKE / OUTPUT: I/O last 3 completed shifts: In: 3891.2 [I.V.:2041.2; NG/GT:1850] Out: 3115 [Urine:3115]  PHYSICAL EXAMINATION:  General: Morbidly obese man, sleeping HEENT: Trach in good position, clean dry intact Neuro: Wakes to voice, nods to questions, follows commands CV: Very distant heart sounds PULM: Decreased bilateral breath sounds, no wheezes GI: Massively obese, no apparent tenderness, positive bowel sounds Extremities: massive edema Skin: No rash   LABS:  BMET Recent Labs  Lab 06/21/17 0447 06/23/17 0449 06/24/17 0405  NA 141 140 140  K 3.5 3.3* 3.6  CL 105 101 100*  CO2 31 33* 33*  BUN 29* 31* 30*  CREATININE 0.95 0.95 0.96  GLUCOSE 137* 145* 155*    Electrolytes Recent Labs  Lab 06/21/17 0447 06/23/17 0449 06/24/17 0405  CALCIUM 8.5* 8.4* 8.8*  MG  --  1.6* 1.7  PHOS  --  3.6 3.8    CBC Recent Labs  Lab 06/22/17 0403 06/23/17 0449 06/24/17 0405  WBC 8.9 7.6 6.7  HGB 10.0* 9.2* 9.1*  HCT 34.9* 32.0* 32.5*  PLT 298 239 223    Coag's No results for input(s): APTT,  INR in the last 168 hours.  Sepsis Markers Recent Labs  Lab 06/23/17 0449  PROCALCITON 0.12    ABG No results for input(s): PHART, PCO2ART, PO2ART in the last 168 hours.  Liver Enzymes Recent Labs  Lab 06/23/17 0449  ALBUMIN 2.1*    Cardiac Enzymes No results for input(s): TROPONINI, PROBNP in the last 168 hours.  Glucose Recent Labs  Lab 06/23/17 0755 06/23/17 1120 06/23/17 1531 06/23/17 2051 06/24/17 0440 06/24/17 0818  GLUCAP 120* 126* 146* 134* 132* 136*    Imaging Dg Chest Port 1 View  Result Date: 06/24/2017 CLINICAL DATA:  Respiratory difficulty EXAM: PORTABLE CHEST 1 VIEW COMPARISON:  06/23/2017 FINDINGS: Tracheostomy tube and feeding catheter are again seen and stable. Stable cardiomegaly is noted. Lungs are well aerated bilaterally. Improved aeration in the right base is noted. Persistent opacity in the left base with consolidation and effusion is seen and stable. No new focal abnormality is noted. IMPRESSION: Stable changes in the left base. Electronically Signed   By: Inez Catalina M.D.   On: 06/24/2017 08:58     STUDIES:  07/2016 PFT> no airflow obstruction, severe restriction. 5/23 Echo> LVEF 55-60%, otherwise poor visualization  CULTURES:   ANTIBIOTICS: 5/22 levaquin > 5/24  SIGNIFICANT EVENTS: 06/22/2017 surgical debridement of sacral wound per surgery  LINES/TUBES:  5/23 ETT > 5/30 5/30 tracheostomy >   DISCUSSION: 56 y/o male with morbid obesity and CHF who has acute on chronic respiratory failure in the setting of bronchitis and likely CHF.  Now s/p tracheostomy.  Surgical wound was debrided with surgery on 06/22/2017  ASSESSMENT / PLAN:  PULMONARY A: Acute respiratory failure with hypercarbia Obesity hypoventilation syndrome Acute bronchitis resolve P:   Continue trach care Push pressure support as he can tolerate, would like to transition to trach collar as soon as he is able Diuresis as below  CARDIOVASCULAR A:  Chronic  diastolic heart failure P:  Diuresis as tolerated, add metolazone to his current torsemide for the next 2 days and follow urine output  RENAL A:   Hypokalemia P:   Follow urine output, BMP Replace electrolytes as indicated Adjust diuretics as above  GASTROINTESTINAL A:   No acute issues P:   SUP famotidine Tube feedings as ordered  HEMATOLOGIC A:   Anemia, no bleeding P:  Transfuse per protocol Observe for any type of hemorrhage especially secondary  INFECTIOUS A:   Acute bronchitis resolved Sacral wound without evidence active infxn P:   Following off abx  ENDOCRINE A:   Mild hyperglycemia P:   Lantus 10 units Sliding scale insulin per protocol  DERM A: Appreciate wound care and general surgery input P: Debridement of sacral area performed on 06/22/17 by CCS Continue Santyl  NEUROLOGIC A:   Intermittent agitation P:   Goals RA SS of 0 Currently on scheduled clonazepam, Seroquel, consider weaning Agree with weaning Precedex, goal to off   FAMILY  - Updates: No family at bedside   Independent CC time 31 minutes  Baltazar Apo, MD, PhD 06/24/2017, 11:03 AM [facility] [phone] or if no answer [phone]

## 2017-06-24 NOTE — Progress Notes (Signed)
Physical Therapy Wound Treatment Patient Details  Name: Timothy Le MRN: [id number] Date of Birth: 01/19/1961  Today's Date: 06/24/2017 Time: 1120-1201 Time Calculation (min): 41 min  Subjective  Subjective: Pt awake and alert on vent.  Patient and Family Stated Goals: None stated Prior Treatments: surgical debridement 6/4  Pain Score:    Wound Assessment  Pressure Injury Stage II -  Partial thickness loss of dermis presenting as a shallow open ulcer with a red, pink wound bed without slough. 2 CM x 2CM excoriated  (Active)  Wound Image   06/23/2017 11:20 AM  Dressing Type ABD;Barrier Film (skin prep);Gauze (Comment);Moist to dry 06/24/2017  2:25 PM  Dressing Changed 06/24/2017  2:25 PM  Dressing Change Frequency Daily 06/24/2017  2:25 PM  State of Healing Eschar 06/24/2017  2:25 PM  Site / Wound Assessment Red;Black;Yellow 06/24/2017  2:25 PM  % Wound base Red or Granulating 40% 06/24/2017  2:25 PM  % Wound base Yellow/Fibrinous Exudate 20% 06/24/2017  2:25 PM  % Wound base Black/Eschar 40% 06/24/2017  2:25 PM  Peri-wound Assessment Intact 06/24/2017  2:25 PM  Wound Length (cm) 9 cm 06/23/2017 11:20 AM  Wound Width (cm) 8 cm 06/23/2017 11:20 AM  Wound Depth (cm) 1.2 cm 06/23/2017 11:20 AM  Wound Surface Area (cm^2) 72 cm^2 06/23/2017 11:20 AM  Wound Volume (cm^3) 86.4 cm^3 06/23/2017 11:20 AM  Tunneling (cm) 0 06/23/2017 11:20 AM  Undermining (cm) 0 06/23/2017 11:20 AM  Margins Unattached edges (unapproximated) 06/24/2017  2:25 PM  Drainage Amount Moderate 06/24/2017  2:25 PM  Drainage Description Odor;Purulent 06/24/2017  2:25 PM  Treatment Debridement (Selective);Hydrotherapy (Pulse lavage);Packing (Saline gauze) 06/24/2017  2:25 PM  Santyl applied to wound bed prior to applying dressing.   Hydrotherapy Pulsed lavage therapy - wound location: central buttocks Pulsed Lavage with Suction (psi): 12 psi Pulsed Lavage with Suction - Normal Saline Used: 1000 mL Pulsed Lavage Tip: Tip with splash shield Selective  Debridement Selective Debridement - Location: central buttocks Selective Debridement - Tools Used: Forceps;Scalpel Selective Debridement - Tissue Removed: Black and yellow necrotic tissue   Wound Assessment and Plan  Wound Therapy - Assess/Plan/Recommendations Wound Therapy - Clinical Statement: Pt continues to bleed easily with debridement. Silver nitrate sticks would be helpful to use PRN throughout treatment. Non-viable tissue is abundant and loose, however bleeding is hindering the amount we are able to remove at this time. Pt will benefit from continued hydrotherapy for selective removal of unviable tissue, and to promote wound bed healing. Wound Therapy - Functional Problem List: Acute pain, decreased tolerance for position changes Factors Delaying/Impairing Wound Healing: Diabetes Mellitus;Incontinence;Immobility;Multiple medical problems;Polypharmacy Hydrotherapy Plan: Debridement;Dressing change;Pulsatile lavage with suction;Patient/family education Wound Therapy - Frequency: 6X / week Wound Therapy - Follow Up Recommendations: Skilled nursing facility Wound Plan: See above  Wound Therapy Goals- Improve the function of patient's integumentary system by progressing the wound(s) through the phases of wound healing (inflammation - proliferation - remodeling) by: Decrease Necrotic Tissue to: 20% Decrease Necrotic Tissue - Progress: Progressing toward goal Increase Granulation Tissue to: 80% Increase Granulation Tissue - Progress: Progressing toward goal Goals/treatment plan/discharge plan were made with and agreed upon by patient/family: Yes Time For Goal Achievement: 7 days Wound Therapy - Potential for Goals: Good  Goals will be updated until maximal potential achieved or discharge criteria met.  Discharge criteria: when goals achieved, discharge from hospital, MD decision/surgical intervention, no progress towards goals, refusal/missing three consecutive treatments without  notification or medical reason.  GP  Thelma Comp 06/24/2017, 2:31 PM   Rolinda Roan, PT, DPT Acute Rehabilitation Services Pager: [phone]

## 2017-06-24 NOTE — Progress Notes (Signed)
[facility] ADULT ICU REPLACEMENT PROTOCOL FOR AM LAB REPLACEMENT ONLY  The patient does apply for the [facility] Adult ICU Electrolyte Replacment Protocol based on the criteria listed below:   1. Is GFR >/= 40 ml/min? Yes.    Patient's GFR today is >60 2. Is urine output >/= 0.5 ml/kg/hr for the last 6 hours? Yes.   Patient's UOP is 0.62 ml/kg/hr 3. Is BUN < 60 mg/dL? Yes.    Patient's BUN today is 30 4. Abnormal electrolyte K 3.6, Mg 1.7 5. Ordered repletion with: per protocol 6. If a panic level lab has been reported, has the CCM MD in charge been notified? Yes.  .   Physician:  Tenny Craw 06/24/2017 6:16 AM

## 2017-06-24 NOTE — Progress Notes (Signed)
Pt weaned for 3 hours on PS 15/ PEEP +8. Placed pt back on full support d/t increased and sustained RR.

## 2017-06-24 NOTE — Progress Notes (Signed)
[place] Progress Note Patient Name: Timothy Le DOB: 06-11-1960 MRN: [id number]   Date of Service  06/24/2017  HPI/Events of Note  Nausea and vomiting  eICU Interventions  PRN Zofran, check baseline EKG to exclude QT prolongation        Hermila Millis U Charnette Younkin 06/24/2017, 11:15 PM

## 2017-06-25 LAB — GLUCOSE, CAPILLARY
GLUCOSE-CAPILLARY: 113 mg/dL — AB (ref 65–99)
GLUCOSE-CAPILLARY: 147 mg/dL — AB (ref 65–99)
Glucose-Capillary: 113 mg/dL — ABNORMAL HIGH (ref 65–99)
Glucose-Capillary: 113 mg/dL — ABNORMAL HIGH (ref 65–99)
Glucose-Capillary: 122 mg/dL — ABNORMAL HIGH (ref 65–99)

## 2017-06-25 LAB — MAGNESIUM: Magnesium: 2 mg/dL (ref 1.7–2.4)

## 2017-06-25 LAB — BASIC METABOLIC PANEL
ANION GAP: 10 (ref 5–15)
BUN: 27 mg/dL — ABNORMAL HIGH (ref 6–20)
CALCIUM: 9.1 mg/dL (ref 8.9–10.3)
CO2: 34 mmol/L — AB (ref 22–32)
Chloride: 98 mmol/L — ABNORMAL LOW (ref 101–111)
Creatinine, Ser: 1.23 mg/dL (ref 0.61–1.24)
GFR calc non Af Amer: >60 mL/min (ref >60)
GLUCOSE: 125 mg/dL — AB (ref 65–99)
POTASSIUM: 3.5 mmol/L (ref 3.5–5.1)
Sodium: 142 mmol/L (ref 135–145)

## 2017-06-25 LAB — CULTURE, BLOOD (ROUTINE X 2)

## 2017-06-25 MED ORDER — SILVER NITRATE-POT NITRATE 75-25 % EX MISC
1.0000 | Freq: Every day | CUTANEOUS | Status: DC
  Administered 2017-06-25 – 2017-06-26 (×2): 1 via TOPICAL
  Filled 2017-06-25 (×3): qty 1

## 2017-06-25 NOTE — Progress Notes (Signed)
Patient ID: Timothy Le, male   DOB: 1960-08-23, 57 y.o.   MRN: [id number]    8 Days Post-Op  Subjective: trached on vent.  Objective: Vital signs in last 24 hours: Temp:  [99.8 F (37.7 C)-101.3 F (38.5 C)] 99.8 F (37.7 C) (06/07 0722) Pulse Rate:  [108-137] 119 (06/07 0800) Resp:  [15-32] 16 (06/07 0800) BP: (119-152)/(61-105) 151/75 (06/07 0742) SpO2:  [88 %-97 %] 91 % (06/07 0800) FiO2 (%):  [40 %] 40 % (06/07 0746) Weight:  [156.7 kg (345 lb 7.4 oz)] 156.7 kg (345 lb 7.4 oz) (06/07 0500) Last BM Date: 06/24/17  Intake/Output from previous day: 06/06 0701 - 06/07 0700 In: 987.7 [I.V.:377.7; NG/GT:610] Out: 2700 [Urine:2700] Intake/Output this shift: Total I/O In: 10 [I.V.:10] Out: 150 [Urine:150]  PE: Skin: 100% necrotic black tissue.  See picture     Lab Results:  Recent Labs    06/23/17 0449 06/24/17 0405  WBC 7.6 6.7  HGB 9.2* 9.1*  HCT 32.0* 32.5*  PLT 239 223   BMET Recent Labs    06/24/17 0405 06/25/17 0420  NA 140 142  K 3.6 3.5  CL 100* 98*  CO2 33* 34*  GLUCOSE 155* 125*  BUN 30* 27*  CREATININE 0.96 1.23  CALCIUM 8.8* 9.1   PT/INR No results for input(s): LABPROT, INR in the last 72 hours. CMP     Component Value Date/Time   NA 142 06/25/2017 0420   K 3.5 06/25/2017 0420   CL 98 (L) 06/25/2017 0420   CO2 34 (H) 06/25/2017 0420   GLUCOSE 125 (H) 06/25/2017 0420   BUN 27 (H) 06/25/2017 0420   CREATININE 1.23 06/25/2017 0420   CALCIUM 9.1 06/25/2017 0420   PROT 7.8 06/10/2017 0347   ALBUMIN 2.1 (L) 06/23/2017 0449   AST 26 06/10/2017 0347   ALT 13 (L) 06/10/2017 0347   ALKPHOS 62 06/10/2017 0347   BILITOT 1.1 06/10/2017 0347   GFRNONAA >60 06/25/2017 0420   GFRAA >60 06/25/2017 0420   Lipase  No results found for: LIPASE     Studies/Results: Dg Chest Port 1 View  Result Date: 06/24/2017 CLINICAL DATA:  Respiratory difficulty EXAM: PORTABLE CHEST 1 VIEW COMPARISON:  06/23/2017 FINDINGS: Tracheostomy tube and  feeding catheter are again seen and stable. Stable cardiomegaly is noted. Lungs are well aerated bilaterally. Improved aeration in the right base is noted. Persistent opacity in the left base with consolidation and effusion is seen and stable. No new focal abnormality is noted. IMPRESSION: Stable changes in the left base. Electronically Signed   By: Inez Catalina M.D.   On: 06/24/2017 08:58    Anti-infectives: Anti-infectives (From admission, onward)   Start     Dose/Rate Route Frequency Ordered Stop   06/17/17 0900  ceFAZolin (ANCEF) 3 g in dextrose 5 % 50 mL IVPB     3 g 100 mL/hr over 30 Minutes Intravenous To Surgery 06/17/17 0858 06/17/17 0902   06/10/17 2200  levofloxacin (LEVAQUIN) IVPB 750 mg  Status:  Discontinued     750 mg 100 mL/hr over 90 Minutes Intravenous Every 24 hours 06/10/17 1031 06/11/17 1535   06/09/17 2230  levofloxacin (LEVAQUIN) IVPB 750 mg  Status:  Discontinued     750 mg 100 mL/hr over 90 Minutes Intravenous Every 24 hours 06/09/17 2120 06/10/17 0104       Assessment/Plan Gluteal pressure wound, 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  Cont hydro and santyl. -eval Monday, but if continues to not respond to conservative management,  then may need debridement in OR.   FEN - tube feeds VTE - Lovenox 80mg  ID - none   LOS: 16 days    Henreitta Cea , [facility] 06/25/2017, 11:15 AM Pager: [phone]

## 2017-06-25 NOTE — Progress Notes (Addendum)
Family Medicine social note  Appreciate tremendous care by CCM and surgery. Still on trach vent. Possibly need for more debridement on Monday.  Guadalupe Dawn MD PGY-1 Family Medicine Resident

## 2017-06-25 NOTE — Progress Notes (Addendum)
PULMONARY / CRITICAL CARE MEDICINE   Name: Timothy Le MRN: [id number] DOB: [birth year]    ADMISSION DATE:  06/09/2017  CHIEF COMPLAINT:  Dyspnea  HISTORY OF PRESENT ILLNESS:   57 y/o male with morbid obesity admitted on 5/22, intubated not long afterwards in the setting of hypercarbia due to obesity, CHF and bronchitis.  Had a tracheostomy on 5/30.   SUBJECTIVE:  Off precedex Tolerating pressure support 15 well, switched to 10, will gauge response.    VITAL SIGNS: BP (!) 117/56   Pulse (!) 114   Temp 99.9 F (37.7 C) (Oral)   Resp (!) 21   Ht 5\' 6"  (1.676 m)   Wt (!) 156.7 kg (345 lb 7.4 oz)   SpO2 91%   BMI 55.76 kg/m   HEMODYNAMICS:    VENTILATOR SETTINGS: Vent Mode: PSV;CPAP FiO2 (%):  [40 %] 40 % Set Rate:  [15 bmp] 15 bmp Vt Set:  [510 mL] 510 mL PEEP:  [8 cmH20] 8 cmH20 Pressure Support:  [15 cmH20] 15 cmH20 Plateau Pressure:  [18 cmH20-21 cmH20] 19 cmH20  INTAKE / OUTPUT: I/O last 3 completed shifts: In: 2290.9 [I.V.:1080.9; NG/GT:1210] Out: 3520 [Urine:3520]  PHYSICAL EXAMINATION: General: Morbidly obese black male HEENT: [place]/AT, trach in place. Dry skin Neuro: Alert, oriented, non-focal. Asking for water.  CV: Very distant heart sounds PULM: Decreased bilateral breath sounds GI: Massively obese, no apparent tenderness, positive bowel sounds Extremities: Significant edema to all 4 extremities.  Skin: No rash   LABS:  BMET Recent Labs  Lab 06/23/17 0449 06/24/17 0405 06/25/17 0420  NA 140 140 142  K 3.3* 3.6 3.5  CL 101 100* 98*  CO2 33* 33* 34*  BUN 31* 30* 27*  CREATININE 0.95 0.96 1.23  GLUCOSE 145* 155* 125*    Electrolytes Recent Labs  Lab 06/23/17 0449 06/24/17 0405 06/25/17 0420  CALCIUM 8.4* 8.8* 9.1  MG 1.6* 1.7 2.0  PHOS 3.6 3.8  --     CBC Recent Labs  Lab 06/22/17 0403 06/23/17 0449 06/24/17 0405  WBC 8.9 7.6 6.7  HGB 10.0* 9.2* 9.1*  HCT 34.9* 32.0* 32.5*  PLT 298 239 223    Coag's No results for  input(s): APTT, INR in the last 168 hours.  Sepsis Markers Recent Labs  Lab 06/23/17 0449  PROCALCITON 0.12    ABG No results for input(s): PHART, PCO2ART, PO2ART in the last 168 hours.  Liver Enzymes Recent Labs  Lab 06/23/17 0449  ALBUMIN 2.1*    Cardiac Enzymes No results for input(s): TROPONINI, PROBNP in the last 168 hours.  Glucose Recent Labs  Lab 06/24/17 1658 06/24/17 2018 06/24/17 2331 06/25/17 0400 06/25/17 0720 06/25/17 1132  GLUCAP 97 139* 118* 113* 113* 113*    Imaging No results found.   STUDIES:  07/2016 PFT> no airflow obstruction, severe restriction. 5/23 Echo> LVEF 55-60%, otherwise poor visualization  CULTURES:   ANTIBIOTICS: 5/22 levaquin > 5/24  SIGNIFICANT EVENTS: 06/22/2017 surgical debridement of sacral wound per surgery  LINES/TUBES: 5/23 ETT > 5/30 5/30 tracheostomy >   DISCUSSION: 57 y/o male with morbid obesity and CHF who has acute on chronic respiratory failure in the setting of bronchitis and likely CHF.  Now s/p tracheostomy.  Surgical wound was debrided with surgery on 06/22/2017  ASSESSMENT / PLAN:  PULMONARY A: Acute respiratory failure with hypercarbia Obesity hypoventilation syndrome Acute bronchitis resolve P:   Continue trach care Push pressure support as he can tolerate, PS 10 today tolerating for some time now.  Diuresis as below  CARDIOVASCULAR A:  Chronic diastolic heart failure P:  Torsemide Metolazone last dose today  RENAL A:   Hypokalemia P:   Follow urine output, BMP Replace electrolytes as indicated Adjust diuretics as above  GASTROINTESTINAL A:   No acute issues P:   SUP famotidine Replace feeding tube Tube feedings as ordered Start process for PEG - spoke to Surgery who will eval next week.   HEMATOLOGIC A:   Anemia, no bleeding P:  Transfuse per protocol Observe for any type of hemorrhage especially secondary  INFECTIOUS A:   Acute bronchitis resolved Sacral wound  without evidence active infxn P:   Following off abx  ENDOCRINE A:   Mild hyperglycemia P:   Lantus 10 units Sliding scale insulin per protocol  DERM A: Appreciate wound care and general surgery input P: Debridement of sacral area performed on 06/22/17 by CCS Continue Santyl Getting hydrotherapy   NEUROLOGIC A:   Intermittent agitation P:   Currently on scheduled clonazepam, Seroquel - well maintained today   FAMILY  - Updates: Patient updated. Called wife. Not available. Number in chart inaccurate. [phone]  Unlikely to liberate from the mechanical ventilator. Suspect even in the best case scenario he will require nocturnal ventilation. Will require LTACH level services.   Georgann Housekeeper, AGACNP-BC [facility] Pulmonology/Critical Care Pager [phone] or [phone]  06/25/2017 12:02 PM

## 2017-06-25 NOTE — Progress Notes (Signed)
Cortrak Tube Team Note:  Consult received to place a Cortrak feeding tube.   A 10 F Cortrak tube was placed in the RIGHT  nare and secured with a nasal bridle at 82 cm. Per the Cortrak monitor reading the tube tip is post-pyloric.   No x-ray is required. RN may begin using tube.   If the tube becomes dislodged please keep the tube and contact the Cortrak team at [email or web address] ([email or web address]) for replacement.  If after hours and replacement cannot be delayed, place a NG tube and confirm placement with an abdominal x-ray.   Kerman Passey MS, RD, [place], [place] [phone] Pager  [phone] Weekend/On-Call Pager

## 2017-06-25 NOTE — Plan of Care (Signed)
  Problem: Health Behavior/Discharge Planning: Goal: Ability to manage health-related needs will improve Outcome: Progressing   Problem: Clinical Measurements: Goal: Ability to maintain clinical measurements within normal limits will improve Outcome: Progressing Goal: Will remain free from infection Outcome: Progressing Goal: Diagnostic test results will improve Outcome: Progressing Goal: Respiratory complications will improve Outcome: Progressing Goal: Cardiovascular complication will be avoided Outcome: Progressing   Problem: Activity: Goal: Risk for activity intolerance will decrease Outcome: Progressing   Problem: Nutrition: Goal: Adequate nutrition will be maintained Outcome: Progressing   Problem: Coping: Goal: Level of anxiety will decrease Outcome: Progressing   Problem: Elimination: Goal: Will not experience complications related to bowel motility Outcome: Progressing Goal: Will not experience complications related to urinary retention Outcome: Progressing   Problem: Pain Managment: Goal: General experience of comfort will improve Outcome: Progressing   Problem: Skin Integrity: Goal: Risk for impaired skin integrity will decrease Outcome: Progressing   Problem: Activity: Goal: Ability to tolerate increased activity will improve Outcome: Progressing   Problem: Respiratory: Goal: Ability to maintain a clear airway and adequate ventilation will improve Outcome: Progressing   Problem: Role Relationship: Goal: Method of communication will improve Outcome: Progressing

## 2017-06-25 NOTE — Progress Notes (Signed)
Physical Therapy Wound Treatment Patient Details  Name: TAMOTSU WIEDERHOLT MRN: [id number] Date of Birth: [birth year]  Today's Date: 06/25/2017 Time: 9833-8250 Time Calculation (min): 53 min  Subjective  Subjective: Pt awake and alert on vent.  Patient and Family Stated Goals: None stated Prior Treatments: surgical debridement 6/4  Pain Score:  Pt appears mildly uncomfortable at times but overall tolerated well. Pt was premedicated  Wound Assessment  Pressure Injury Stage II -  Partial thickness loss of dermis presenting as a shallow open ulcer with a red, pink wound bed without slough. 2 CM x 2CM excoriated  (Active)  Wound Image   06/23/2017 11:20 AM  Dressing Type ABD;Barrier Film (skin prep);Gauze (Comment);Moist to dry 06/25/2017  1:39 PM  Dressing Clean;Dry;Intact 06/25/2017  1:39 PM  Dressing Change Frequency Daily 06/25/2017  1:39 PM  State of Healing Eschar 06/25/2017  1:39 PM  Site / Wound Assessment Red;Black 06/25/2017  1:39 PM  % Wound base Red or Granulating 40% 06/25/2017  1:39 PM  % Wound base Yellow/Fibrinous Exudate 0% 06/25/2017  1:39 PM  % Wound base Black/Eschar 60% 06/25/2017  1:39 PM  % Wound base Other/Granulation Tissue (Comment) 0% 06/25/2017  1:39 PM  Peri-wound Assessment Intact 06/25/2017  1:39 PM  Wound Length (cm) 9 cm 06/23/2017 11:20 AM  Wound Width (cm) 8 cm 06/23/2017 11:20 AM  Wound Depth (cm) 1.2 cm 06/23/2017 11:20 AM  Wound Surface Area (cm^2) 72 cm^2 06/23/2017 11:20 AM  Wound Volume (cm^3) 86.4 cm^3 06/23/2017 11:20 AM  Tunneling (cm) 0 06/23/2017 11:20 AM  Undermining (cm) 0 06/23/2017 11:20 AM  Margins Unattached edges (unapproximated) 06/25/2017  1:39 PM  Drainage Amount Moderate 06/25/2017  1:39 PM  Drainage Description Odor;Purulent 06/25/2017  1:39 PM  Treatment Debridement (Selective);Hydrotherapy (Pulse lavage);Packing (Saline gauze) 06/25/2017  1:39 PM   Santyl applied to wound bed prior to applying dressing.     Hydrotherapy Pulsed lavage therapy - wound location: central  buttocks Pulsed Lavage with Suction (psi): 12 psi Pulsed Lavage with Suction - Normal Saline Used: 1000 mL Pulsed Lavage Tip: Tip with splash shield Selective Debridement Selective Debridement - Location: central buttocks Selective Debridement - Tools Used: Forceps;Scalpel Selective Debridement - Tissue Removed: Black necrotic tissue   Wound Assessment and Plan  Wound Therapy - Assess/Plan/Recommendations Wound Therapy - Clinical Statement: Surgical PA present to assess wound with PT at beginning of session. Pt continues to bleed easily however today we were more successful with debridement as we had silver nitrate sticks available to help control the bleeding. This patient will benefit from continued hydrotherapy for selective removal of unviable tissue and promote wound bed healing.  Wound Therapy - Functional Problem List: Acute pain, decreased tolerance for position changes Factors Delaying/Impairing Wound Healing: Diabetes Mellitus;Incontinence;Immobility;Multiple medical problems;Polypharmacy Hydrotherapy Plan: Debridement;Dressing change;Pulsatile lavage with suction;Patient/family education Wound Therapy - Frequency: 6X / week Wound Therapy - Follow Up Recommendations: Skilled nursing facility Wound Plan: See above  Wound Therapy Goals- Improve the function of patient's integumentary system by progressing the wound(s) through the phases of wound healing (inflammation - proliferation - remodeling) by: Decrease Necrotic Tissue to: 20% Decrease Necrotic Tissue - Progress: Progressing toward goal Increase Granulation Tissue to: 80% Increase Granulation Tissue - Progress: Progressing toward goal Goals/treatment plan/discharge plan were made with and agreed upon by patient/family: Yes Time For Goal Achievement: 7 days Wound Therapy - Potential for Goals: Good  Goals will be updated until maximal potential achieved or discharge criteria met.  Discharge criteria: when goals achieved,  discharge from hospital, MD decision/surgical intervention, no progress towards goals, refusal/missing three consecutive treatments without notification or medical reason.  GP     Thelma Comp 06/25/2017, 1:45 PM   Rolinda Roan, PT, DPT Acute Rehabilitation Services Pager: [phone]

## 2017-06-26 LAB — GLUCOSE, CAPILLARY
GLUCOSE-CAPILLARY: 146 mg/dL — AB (ref 65–99)
GLUCOSE-CAPILLARY: 134 mg/dL — AB (ref 65–99)
Glucose-Capillary: 123 mg/dL — ABNORMAL HIGH (ref 65–99)
Glucose-Capillary: 130 mg/dL — ABNORMAL HIGH (ref 65–99)
Glucose-Capillary: 133 mg/dL — ABNORMAL HIGH (ref 65–99)
Glucose-Capillary: 136 mg/dL — ABNORMAL HIGH (ref 65–99)

## 2017-06-26 LAB — BASIC METABOLIC PANEL
ANION GAP: 8 (ref 5–15)
BUN: 26 mg/dL — ABNORMAL HIGH (ref 6–20)
CALCIUM: 8.7 mg/dL — AB (ref 8.9–10.3)
CO2: 36 mmol/L — ABNORMAL HIGH (ref 22–32)
Chloride: 97 mmol/L — ABNORMAL LOW (ref 101–111)
Creatinine, Ser: 1 mg/dL (ref 0.61–1.24)
Glucose, Bld: 137 mg/dL — ABNORMAL HIGH (ref 65–99)
POTASSIUM: 3.3 mmol/L — AB (ref 3.5–5.1)
Sodium: 141 mmol/L (ref 135–145)

## 2017-06-26 MED ORDER — SODIUM CHLORIDE 0.9 % IV SOLN
1.0000 g | INTRAVENOUS | Status: AC
  Administered 2017-06-26 – 2017-07-02 (×7): 1 g via INTRAVENOUS
  Filled 2017-06-26 (×7): qty 10

## 2017-06-26 MED ORDER — POTASSIUM CHLORIDE 20 MEQ/15ML (10%) PO SOLN
40.0000 meq | ORAL | Status: AC
  Administered 2017-06-26 (×2): 40 meq
  Filled 2017-06-26 (×2): qty 30

## 2017-06-26 NOTE — Progress Notes (Signed)
Physical Therapy Wound Treatment Patient Details  Name: Timothy Le MRN: [id number] Date of Birth: March 29, 1960  Today's Date: 06/26/2017 Time:  -     Subjective  Subjective: Pt awake and alert on vent.  Patient and Family Stated Goals: None stated Prior Treatments: surgical debridement 6/4  Pain Score:    Wound Assessment  Pressure Injury Stage II -  Partial thickness loss of dermis presenting as a shallow open ulcer with a red, pink wound bed without slough. 2 CM x 2CM excoriated  (Active)  Wound Image   06/23/2017 11:20 AM  Dressing Type ABD;Barrier Film (skin prep);Gauze (Comment);Moist to dry 06/26/2017  8:57 AM  Dressing Clean;Dry;Intact 06/26/2017  8:57 AM  Dressing Change Frequency Daily 06/26/2017  8:57 AM  State of Healing Eschar 06/26/2017  8:57 AM  Site / Wound Assessment Red;Black 06/26/2017  8:57 AM  % Wound base Red or Granulating 40% 06/26/2017  8:57 AM  % Wound base Yellow/Fibrinous Exudate 0% 06/26/2017  8:57 AM  % Wound base Black/Eschar 60% 06/26/2017  8:57 AM  % Wound base Other/Granulation Tissue (Comment) 0% 06/26/2017  8:57 AM  Peri-wound Assessment Intact 06/26/2017  8:57 AM  Wound Length (cm) 9 cm 06/23/2017 11:20 AM  Wound Width (cm) 8 cm 06/23/2017 11:20 AM  Wound Depth (cm) 1.2 cm 06/23/2017 11:20 AM  Wound Surface Area (cm^2) 72 cm^2 06/23/2017 11:20 AM  Wound Volume (cm^3) 86.4 cm^3 06/23/2017 11:20 AM  Tunneling (cm) 0 06/23/2017 11:20 AM  Undermining (cm) 0 06/23/2017 11:20 AM  Margins Unattached edges (unapproximated) 06/26/2017  8:57 AM  Drainage Amount Moderate 06/26/2017  8:57 AM  Drainage Description Odor;Purulent 06/26/2017  8:57 AM  Treatment Debridement (Selective);Hydrotherapy (Pulse lavage);Packing (Saline gauze) 06/26/2017  8:57 AM     Pressure Injury 06/23/17 Stage III -  Full thickness tissue loss. Subcutaneous fat may be visible but bone, tendon or muscle are NOT exposed. (Active)  Dressing Type Foam;Gauze (Comment) 06/26/2017  8:00 AM  Dressing Clean;Dry;Intact 06/26/2017   8:00 AM  Dressing Change Frequency PRN 06/26/2017  8:00 AM  Site / Wound Assessment Dressing in place / Unable to assess 06/26/2017  8:00 AM     Incision (Closed) 06/17/17 Neck (Active)  Dressing Type Gauze (Comment) 06/26/2017  4:00 AM  Dressing Clean;Dry;Intact 06/26/2017  4:00 AM  Dressing Change Frequency PRN 06/26/2017  4:00 AM  Site / Wound Assessment Clean;Dry 06/26/2017  4:00 AM  Drainage Amount None 06/26/2017  4:00 AM  Drainage Description Sanguineous 06/26/2017  4:00 AM  Treatment Cleansed 06/25/2017  8:00 PM  Santyl applied to wound bed prior to applying dressing.   Hydrotherapy Pulsed lavage therapy - wound location: central buttocks Pulsed Lavage with Suction (psi): 12 psi(8 over granulation due to soiled from BM) Pulsed Lavage with Suction - Normal Saline Used: 1000 mL Pulsed Lavage Tip: Tip with splash shield Selective Debridement Selective Debridement - Location: central buttocks Selective Debridement - Tools Used: Forceps;Scalpel Selective Debridement - Tissue Removed: Black necrotic tissue   Wound Assessment and Plan  Wound Therapy - Assess/Plan/Recommendations Wound Therapy - Functional Problem List: Acute pain, decreased tolerance for position changes Factors Delaying/Impairing Wound Healing: Diabetes Mellitus;Incontinence;Immobility;Multiple medical problems;Polypharmacy Hydrotherapy Plan: Debridement;Dressing change;Pulsatile lavage with suction;Patient/family education Wound Therapy - Frequency: 6X / week Wound Therapy - Follow Up Recommendations: Skilled nursing facility Wound Plan: See above  Wound Therapy Goals- Improve the function of patient's integumentary system by progressing the wound(s) through the phases of wound healing (inflammation - proliferation - remodeling) by: Decrease Necrotic Tissue  to: 20% Increase Granulation Tissue to: 80% Goals/treatment plan/discharge plan were made with and agreed upon by patient/family: Yes Time For Goal Achievement: 7  days Wound Therapy - Potential for Goals: Good  Goals will be updated until maximal potential achieved or discharge criteria met.  Discharge criteria: when goals achieved, discharge from hospital, MD decision/surgical intervention, no progress towards goals, refusal/missing three consecutive treatments without notification or medical reason.  GP     Rexanne Mano, PT 06/26/2017, 9:42 AM

## 2017-06-26 NOTE — Progress Notes (Signed)
PULMONARY / CRITICAL CARE MEDICINE   Name: Timothy Le MRN: [id number] DOB: [birth year]    ADMISSION DATE:  06/09/2017  CHIEF COMPLAINT:  Dyspnea  HISTORY OF PRESENT ILLNESS:   57 y/o male with morbid obesity admitted on 5/22, intubated not long afterwards in the setting of hypercarbia due to obesity, CHF and bronchitis.  Had a tracheostomy on 5/30.   SUBJECTIVE:  Patient is awake and interacting He is currently on pressure support 10, PEEP of 8 He had a low-grade fever this morning  VITAL SIGNS: BP 105/77   Pulse (!) 109   Temp 99.8 F (37.7 C) (Oral)   Resp (!) 35   Ht 5\' 6"  (1.676 m)   Wt (!) 156.4 kg (344 lb 12.8 oz)   SpO2 92%   BMI 55.65 kg/m   HEMODYNAMICS:    VENTILATOR SETTINGS: Vent Mode: PSV;CPAP FiO2 (%):  [40 %] 40 % Set Rate:  [15 bmp] 15 bmp Vt Set:  [510 mL] 510 mL PEEP:  [8 cmH20] 8 cmH20 Pressure Support:  [10 cmH20-15 cmH20] 10 cmH20 Plateau Pressure:  [18 cmH20-20 cmH20] 19 cmH20  INTAKE / OUTPUT: I/O last 3 completed shifts: In: 1069.2 [I.V.:360; NG/GT:709.2] Out: 2745 [Urine:2745]  PHYSICAL EXAMINATION: General: Morbidly obese gentleman, ventilated HEENT: Tracheostomy in place, clean dry intact, no oral lesions Neuro: Awake, oriented, interacting appropriately and following commands CV: Very distant heart sounds PULM: Bilateral decreased breath sounds, no wheezing GI: Massively obese, no apparent tenderness, positive bowel sounds Extremities: 1+ bilateral pedal edema Skin: No rash   LABS:  BMET Recent Labs  Lab 06/24/17 0405 06/25/17 0420 06/26/17 0429  NA 140 142 141  K 3.6 3.5 3.3*  CL 100* 98* 97*  CO2 33* 34* 36*  BUN 30* 27* 26*  CREATININE 0.96 1.23 1.00  GLUCOSE 155* 125* 137*    Electrolytes Recent Labs  Lab 06/23/17 0449 06/24/17 0405 06/25/17 0420 06/26/17 0429  CALCIUM 8.4* 8.8* 9.1 8.7*  MG 1.6* 1.7 2.0  --   PHOS 3.6 3.8  --   --     CBC Recent Labs  Lab 06/22/17 0403 06/23/17 0449  06/24/17 0405  WBC 8.9 7.6 6.7  HGB 10.0* 9.2* 9.1*  HCT 34.9* 32.0* 32.5*  PLT 298 239 223    Coag's No results for input(s): APTT, INR in the last 168 hours.  Sepsis Markers Recent Labs  Lab 06/23/17 0449  PROCALCITON 0.12    ABG No results for input(s): PHART, PCO2ART, PO2ART in the last 168 hours.  Liver Enzymes Recent Labs  Lab 06/23/17 0449  ALBUMIN 2.1*    Cardiac Enzymes No results for input(s): TROPONINI, PROBNP in the last 168 hours.  Glucose Recent Labs  Lab 06/25/17 1132 06/25/17 1521 06/25/17 2008 06/26/17 0008 06/26/17 0412 06/26/17 0807  GLUCAP 113* 122* 147* 123* 136* 133*    Imaging No results found.   STUDIES:  07/2016 PFT> no airflow obstruction, severe restriction. 5/23 Echo> LVEF 55-60%, otherwise poor visualization  CULTURES:   ANTIBIOTICS: 5/22 levaquin > 5/24  SIGNIFICANT EVENTS: 06/22/2017 surgical debridement of sacral wound per surgery  LINES/TUBES: 5/23 ETT > 5/30 5/30 tracheostomy >   DISCUSSION: 57 y/o male with morbid obesity and CHF who has acute on chronic respiratory failure in the setting of bronchitis and likely CHF.  Now s/p tracheostomy.  Surgical wound was debrided with surgery on 06/22/2017  ASSESSMENT / PLAN:  PULMONARY A: Acute respiratory failure with hypercarbia Obesity hypoventilation syndrome Acute bronchitis resolve P:  Continue his current trach care Continue pressure support as he can tolerate, would like to get him to a place where he could start to do trach collar although he has not tolerated to date.  Currently on pressure support 10, PEEP 8 Leave PEEP at 8 given the degree of his obesity Dosing diuretics daily based on hemodynamics and renal function   CARDIOVASCULAR A:  Chronic diastolic heart failure P:  On daily torsemide, dosing metolazone daily depending on overall volume status, renal function, hemodynamics  RENAL A:   Hypokalemia P:   Follow urine output, BMP Replace  electrolytes as indicated Adjusting diuretics daily as described above  GASTROINTESTINAL A:   No acute issues P:   SUP famotidine as ordered Continue tube feeding Start process for PEG placement, surgery notified about patient who will evaluate him next week  HEMATOLOGIC A:   Anemia, no bleeding P:  Goal Hgb > 7.0  INFECTIOUS A:   Acute bronchitis resolved Sacral wound without evidence active infxn, but no culture from swab did identify few E. coli Fever 6/8 P:   Start ceftriaxone for possible sacral wound infection given culture data, 6/8 We will continue to follow with surgery, he may need further debridement  ENDOCRINE A:   Mild hyperglycemia P:   Continue Lantus as ordered, 10 units Sliding scale insulin per protocol  DERM A: Appreciate wound care and general surgery input P: Debridement of sacral area performed 06/22/2017 by [facility] Continue Santyl, hydrotherapy  NEUROLOGIC A:   Intermittent agitation P:   Scheduled clonazepam, Seroquel Precedex has been weaned to off   FAMILY  - Updates: Patient updated. Wife phone number in chart inaccurate. [phone]  Unlikely to liberate from the mechanical ventilator. Suspect even in the best case scenario he will require nocturnal ventilation. Would need an LTAc but he does not have insurance coverage for this.    Independent CC time 32 minutes  Baltazar Apo, MD, PhD 06/26/2017, 11:01 AM [facility] [phone] or if no answer [phone]

## 2017-06-26 NOTE — Progress Notes (Signed)
FPTS Interim Progress Note  Patient doing well with trach collar.  Plan to continue weaning off vent later today.  FPTS appreciates the great care provided by critical care and will continue to follow and assume care once patient is stable to be transferred out of the ICU.  Parrish, Bonn, DO 06/26/2017, 9:51 AM PGY-2, [place] [facility] pager: [phone]

## 2017-06-26 NOTE — Progress Notes (Signed)
[facility] Progress Note Patient Name: Timothy Le DOB: 05/27/1960 MRN: [id number]   Date of Service  06/26/2017  HPI/Events of Note  Hypokalemia  eICU Interventions  Potassium replaced     Intervention Category Intermediate Interventions: Electrolyte abnormality - evaluation and management  [name] 06/26/2017, 6:06 AM

## 2017-06-27 LAB — BASIC METABOLIC PANEL
ANION GAP: 9 (ref 5–15)
BUN: 25 mg/dL — ABNORMAL HIGH (ref 6–20)
CO2: 35 mmol/L — AB (ref 22–32)
Calcium: 9 mg/dL (ref 8.9–10.3)
Chloride: 95 mmol/L — ABNORMAL LOW (ref 101–111)
Creatinine, Ser: 0.89 mg/dL (ref 0.61–1.24)
GFR calc Af Amer: >60 mL/min (ref >60)
GLUCOSE: 132 mg/dL — AB (ref 65–99)
POTASSIUM: 3.6 mmol/L (ref 3.5–5.1)
Sodium: 139 mmol/L (ref 135–145)

## 2017-06-27 LAB — GLUCOSE, CAPILLARY
GLUCOSE-CAPILLARY: 131 mg/dL — AB (ref 65–99)
Glucose-Capillary: 121 mg/dL — ABNORMAL HIGH (ref 65–99)
Glucose-Capillary: 133 mg/dL — ABNORMAL HIGH (ref 65–99)
Glucose-Capillary: 133 mg/dL — ABNORMAL HIGH (ref 65–99)
Glucose-Capillary: 130 mg/dL — ABNORMAL HIGH (ref 65–99)
Glucose-Capillary: 118 mg/dL — ABNORMAL HIGH (ref 65–99)

## 2017-06-27 LAB — CULTURE, BLOOD (ROUTINE X 2)
CULTURE: NO GROWTH
Special Requests: ADEQUATE

## 2017-06-27 LAB — CBC
HCT: 32.3 % — ABNORMAL LOW (ref 39.0–52.0)
HEMOGLOBIN: 9 g/dL — AB (ref 13.0–17.0)
MCH: 25.4 pg — AB (ref 26.0–34.0)
MCHC: 27.9 g/dL — ABNORMAL LOW (ref 30.0–36.0)
MCV: 91.2 fL (ref 78.0–100.0)
Platelets: 271 10*3/uL (ref 150–400)
RBC: 3.54 MIL/uL — AB (ref 4.22–5.81)
RDW: 17.6 % — ABNORMAL HIGH (ref 11.5–15.5)
WBC: 8.2 10*3/uL (ref 4.0–10.5)

## 2017-06-27 LAB — MAGNESIUM: Magnesium: 2 mg/dL (ref 1.7–2.4)

## 2017-06-27 MED ORDER — SILVER NITRATE-POT NITRATE 75-25 % EX MISC
1.0000 "application " | Freq: Every day | CUTANEOUS | Status: DC | PRN
  Filled 2017-06-27: qty 1

## 2017-06-27 MED ORDER — METOLAZONE 5 MG PO TABS: 5.0000 mg | ORAL_TABLET | Freq: Every day | ORAL | Status: DC

## 2017-06-27 MED ORDER — METOLAZONE 5 MG PO TABS
5.0000 mg | ORAL_TABLET | Freq: Every day | ORAL | Status: AC
  Administered 2017-06-27 – 2017-06-28 (×2): 5 mg
  Filled 2017-06-27 (×2): qty 1

## 2017-06-27 NOTE — Consult Note (Signed)
[facility] Nurse wound consult note Reason for Consult: Bedside RN contacted this writer to inform me of patient's pending orders to transfer to a stepdown unit.  Requesting consultation on provision of therapeutic mattress with low air loss feature; note that he is currently on a similar product, has a sacral pressure injury and is undergoing hydrotherapy as a therapeutic intervention. Given body habitus of patient, a bariatric mattress with low air loss feature is ordered. Wound type: Pressure  WOC nursing team will continue to follow weekly and as needed and will remain available to this patient, the nursing and medical teams.  Please re-consult if needed in between visits. Thanks, Maudie Flakes, MSN, RN, [place], Arther Abbott  Pager# [phone]

## 2017-06-27 NOTE — Progress Notes (Signed)
PULMONARY / CRITICAL CARE MEDICINE   Name: Timothy Le MRN: [id number] DOB: [birth year]    ADMISSION DATE:  06/09/2017  CHIEF COMPLAINT:  Dyspnea  HISTORY OF PRESENT ILLNESS:   57 y/o male with morbid obesity admitted on 5/22, intubated not long afterwards in the setting of hypercarbia due to obesity, CHF and bronchitis.  Had a tracheostomy on 5/30.   SUBJECTIVE:  Tolerated PSV 10, PEEP of 8 for several hours on 6/8, trying again today No other new issues reported  VITAL SIGNS: BP (!) 105/53 (BP Location: Left Arm)   Pulse (!) 107   Temp 100 F (37.8 C) (Oral)   Resp 20   Ht 5\' 6"  (1.676 m)   Wt (!) 156.2 kg (344 lb 5.7 oz)   SpO2 93%   BMI 55.58 kg/m   HEMODYNAMICS:    VENTILATOR SETTINGS: Vent Mode: PSV;CPAP FiO2 (%):  [40 %-50 %] 40 % Set Rate:  [15 bmp] 15 bmp Vt Set:  [510 mL] 510 mL PEEP:  [8 cmH20] 8 cmH20 Pressure Support:  [10 cmH20] 10 cmH20 Plateau Pressure:  [14 cmH20-23 cmH20] 21 cmH20  INTAKE / OUTPUT: I/O last 3 completed shifts: In: 2200 [I.V.:350; NG/GT:1750; IV Piggyback:100] Out: 3120 [Urine:3120]  PHYSICAL EXAMINATION: General: Morbidly obese, ventilated, awake HEENT: Tracheostomy in place, clean, dry, intact Neuro: Awake, oriented, interacts appropriately, follows commands CV: Very distant, no murmurs heard PULM: Decreased bilateral breath sounds, no wheezing GI: Massively obese, no apparent tenderness, positive bowel sounds Extremities: 1+ bilateral pedal edema Skin: No rash, unstageable decubitus wound that has been evaluated by wound care and surgery   LABS:  BMET Recent Labs  Lab 06/25/17 0420 06/26/17 0429 06/27/17 0449  NA 142 141 139  K 3.5 3.3* 3.6  CL 98* 97* 95*  CO2 34* 36* 35*  BUN 27* 26* 25*  CREATININE 1.23 1.00 0.89  GLUCOSE 125* 137* 132*    Electrolytes Recent Labs  Lab 06/23/17 0449 06/24/17 0405 06/25/17 0420 06/26/17 0429 06/27/17 0449  CALCIUM 8.4* 8.8* 9.1 8.7* 9.0  MG 1.6* 1.7 2.0  --   2.0  PHOS 3.6 3.8  --   --   --     CBC Recent Labs  Lab 06/23/17 0449 06/24/17 0405 06/27/17 0449  WBC 7.6 6.7 8.2  HGB 9.2* 9.1* 9.0*  HCT 32.0* 32.5* 32.3*  PLT 239 223 271    Coag's No results for input(s): APTT, INR in the last 168 hours.  Sepsis Markers Recent Labs  Lab 06/23/17 0449  PROCALCITON 0.12    ABG No results for input(s): PHART, PCO2ART, PO2ART in the last 168 hours.  Liver Enzymes Recent Labs  Lab 06/23/17 0449  ALBUMIN 2.1*    Cardiac Enzymes No results for input(s): TROPONINI, PROBNP in the last 168 hours.  Glucose Recent Labs  Lab 06/26/17 1112 06/26/17 1552 06/26/17 2007 06/27/17 0009 06/27/17 0401 06/27/17 0804  GLUCAP 146* 134* 130* 131* 121* 133*    Imaging No results found.   STUDIES:  07/2016 PFT> no airflow obstruction, severe restriction. 5/23 Echo> LVEF 55-60%, otherwise poor visualization  CULTURES:   ANTIBIOTICS: 5/22 levaquin > 5/24  SIGNIFICANT EVENTS: 06/22/2017 surgical debridement of sacral wound per surgery  LINES/TUBES: 5/23 ETT > 5/30 5/30 tracheostomy >   DISCUSSION: 57 y/o male with morbid obesity and CHF who has acute on chronic respiratory failure in the setting of bronchitis and likely CHF.  Now s/p tracheostomy.  Surgical wound was debrided with surgery on 06/22/2017, still following  ASSESSMENT / PLAN:  PULMONARY A: Acute respiratory failure with hypercarbia Obesity hypoventilation syndrome Acute bronchitis resolve P:   Continue trach care Continue efforts at pressure support as he is able to tolerate.  Goal is to get the trach collar although unsuccessful thus far.  He has done PS 10/PEEP of 8; plan to continue PEEP of 8 given his obesity, atelectasis Dosing diuretics daily based on his hemodynamics and renal function   CARDIOVASCULAR A:  Chronic diastolic heart failure P:  On daily torsemide, have been dosing metolazone based on volume status, renal function.  I believe we can give  a dose today and on 6/10, then reassess. Tight blood pressure control  RENAL A:   Hypokalemia P:   Follow urine output, BMP Replace electrolytes as indicated Adjusting diuretics daily as described above.  GASTROINTESTINAL A:   No acute issues P:   SUP famotidine as ordered Tube feeding as ordered. Appreciate surgery assistance, to be evaluated this week for PEG placement.  HEMATOLOGIC A:   Anemia, no bleeding P:  Goal Hgb > 7.0  INFECTIOUS A:   Acute bronchitis resolved Sacral wound without evidence active infxn, but no culture from swab did identify few E. coli Fever 6/8 P:   Ceftriaxone started for possible sacral wound infection on 6/8 given culture data Appreciate surgery assistance, planning to evaluate wound further on 6/10.  He has already had debridement on one occasion 6/4  ENDOCRINE A:   Mild hyperglycemia P:   Lantus 10 units Sliding scale insulin per protocol  DERM A: Appreciate wound care and general surgery input P: Debridement of sacral area performed 06/22/2017 by [facility] Planning to reassess on 6/10 as above Continue Santyl, hydrotherapy  NEUROLOGIC A:   Intermittent agitation P:   Scheduled clonazepam, Seroquel Precedex has been weaned to off  Plan to change to stepdown status on 6/9  FAMILY  - Updates: Patient updated. Wife phone number in chart inaccurate. [phone]  Unlikely to liberate from the mechanical ventilator. Suspect even in the best case scenario he will require nocturnal ventilation. Would need an LTAc but he does not have insurance coverage for this.    Baltazar Apo, MD, PhD 06/27/2017, 10:39 AM [facility] [phone] or if no answer [phone]

## 2017-06-27 NOTE — Progress Notes (Signed)
FPTS Interim Progress Note  Patient doing well with trach collar.  Plan to continue weaning off vent again later today.  FPTS appreciates the great care provided by critical care and will continue to follow and assume care once patient is stable to be transferred out of the ICU.  [place] Bing, DO 06/27/2017, 6:59 AM PGY-2, [place] pager: [phone]

## 2017-06-27 NOTE — Progress Notes (Signed)
[facility] aware of need for PEG. Will d/w Dr. Grandville Silos on Monday.  Will also eval wound on Monday and plan for OR if no improvement in wound over weekend.  Timothy Le 8:01 AM 06/27/2017

## 2017-06-28 DIAGNOSIS — A419 Sepsis, unspecified organism: Secondary | ICD-10-CM

## 2017-06-28 DIAGNOSIS — R6521 Severe sepsis with septic shock: Secondary | ICD-10-CM

## 2017-06-28 DIAGNOSIS — G4733 Obstructive sleep apnea (adult) (pediatric): Secondary | ICD-10-CM

## 2017-06-28 DIAGNOSIS — J441 Chronic obstructive pulmonary disease with (acute) exacerbation: Secondary | ICD-10-CM

## 2017-06-28 DIAGNOSIS — J9691 Respiratory failure, unspecified with hypoxia: Secondary | ICD-10-CM

## 2017-06-28 LAB — CBC
HEMATOCRIT: 31.9 % — AB (ref 39.0–52.0)
HEMOGLOBIN: 9 g/dL — AB (ref 13.0–17.0)
MCH: 25.1 pg — ABNORMAL LOW (ref 26.0–34.0)
MCHC: 28.2 g/dL — AB (ref 30.0–36.0)
MCV: 89.1 fL (ref 78.0–100.0)
Platelets: 274 10*3/uL (ref 150–400)
RBC: 3.58 MIL/uL — ABNORMAL LOW (ref 4.22–5.81)
RDW: 17.4 % — AB (ref 11.5–15.5)
WBC: 8.3 10*3/uL (ref 4.0–10.5)

## 2017-06-28 LAB — BASIC METABOLIC PANEL
Anion gap: 11 (ref 5–15)
BUN: 29 mg/dL — ABNORMAL HIGH (ref 6–20)
CALCIUM: 9.1 mg/dL (ref 8.9–10.3)
CHLORIDE: 92 mmol/L — AB (ref 101–111)
CO2: 35 mmol/L — AB (ref 22–32)
CREATININE: 0.99 mg/dL (ref 0.61–1.24)
GFR calc Af Amer: >60 mL/min (ref >60)
GFR calc non Af Amer: >60 mL/min (ref >60)
GLUCOSE: 120 mg/dL — AB (ref 65–99)
Potassium: 3.6 mmol/L (ref 3.5–5.1)
Sodium: 138 mmol/L (ref 135–145)

## 2017-06-28 LAB — GLUCOSE, CAPILLARY
GLUCOSE-CAPILLARY: 122 mg/dL — AB (ref 65–99)
GLUCOSE-CAPILLARY: 125 mg/dL — AB (ref 65–99)
GLUCOSE-CAPILLARY: 131 mg/dL — AB (ref 65–99)
Glucose-Capillary: 127 mg/dL — ABNORMAL HIGH (ref 65–99)
Glucose-Capillary: 130 mg/dL — ABNORMAL HIGH (ref 65–99)
Glucose-Capillary: 147 mg/dL — ABNORMAL HIGH (ref 65–99)

## 2017-06-28 LAB — PHOSPHORUS: Phosphorus: 3.2 mg/dL (ref 2.5–4.6)

## 2017-06-28 LAB — MAGNESIUM: Magnesium: 1.8 mg/dL (ref 1.7–2.4)

## 2017-06-28 NOTE — Progress Notes (Signed)
CSW continues to follow for disposition needs. CSW aware that pt remains vent at 40 percent at this time.   Timothy Le, MSW, [facility] Clinical Social Worker [phone]

## 2017-06-28 NOTE — Progress Notes (Signed)
Physical Therapy Wound Treatment Patient Details  Name: Timothy Le MRN: [id number] Date of Birth: 1960-04-11  Today's Date: 06/28/2017 Time: 4098-1191 Time Calculation (min): 59 min  Subjective  Subjective: Pt awake and alert on vent.  Patient and Family Stated Goals: None stated Prior Treatments: surgical debridement 6/4  Pain Score:  Pt was premedicated and tolerated well with 1 break taken during treatment due to pain.   Wound Assessment  Pressure Injury Stage II -  Partial thickness loss of dermis presenting as a shallow open ulcer with a red, pink wound bed without slough. 2 CM x 2CM excoriated  (Active)  Wound Image   06/23/2017 11:20 AM  Dressing Type ABD;Barrier Film (skin prep);Gauze (Comment);Moist to dry 06/28/2017 12:42 PM  Dressing Clean;Dry;Intact 06/28/2017 12:42 PM  Dressing Change Frequency Daily 06/28/2017 12:42 PM  State of Healing Eschar 06/28/2017 12:42 PM  Site / Wound Assessment Red;Black;Yellow 06/28/2017 12:42 PM  % Wound base Red or Granulating 40% 06/28/2017 12:42 PM  % Wound base Yellow/Fibrinous Exudate 10% 06/28/2017 12:42 PM  % Wound base Black/Eschar 50% 06/28/2017 12:42 PM  % Wound base Other/Granulation Tissue (Comment) 0% 06/28/2017 12:42 PM  Peri-wound Assessment Intact 06/28/2017 12:42 PM  Wound Length (cm) 9 cm 06/23/2017 11:20 AM  Wound Width (cm) 8 cm 06/23/2017 11:20 AM  Wound Depth (cm) 1.2 cm 06/23/2017 11:20 AM  Wound Surface Area (cm^2) 72 cm^2 06/23/2017 11:20 AM  Wound Volume (cm^3) 86.4 cm^3 06/23/2017 11:20 AM  Tunneling (cm) 0 06/23/2017 11:20 AM  Undermining (cm) 0 06/23/2017 11:20 AM  Margins Unattached edges (unapproximated) 06/28/2017 12:42 PM  Drainage Amount Moderate 06/28/2017 12:42 PM  Drainage Description Odor;Purulent 06/28/2017 12:42 PM  Treatment Debridement (Selective);Hydrotherapy (Pulse lavage);Packing (Saline gauze) 06/28/2017 12:42 PM  Santyl applied to wound bed prior to applying dressing.   Hydrotherapy Pulsed lavage therapy - wound  location: central buttocks Pulsed Lavage with Suction (psi): 8 psi Pulsed Lavage with Suction - Normal Saline Used: 1000 mL Pulsed Lavage Tip: Tip with splash shield Selective Debridement Selective Debridement - Location: central buttocks Selective Debridement - Tools Used: Forceps;Scalpel;Scissors Selective Debridement - Tissue Removed: Black necrotic tissue   Wound Assessment and Plan  Wound Therapy - Assess/Plan/Recommendations Wound Therapy - Clinical Statement: Surgical PA present to assess wound with PT at beginning of session. Pt a little more painful this session but was able to tolerate selective debridement fairly well with min-mod bleeding. This patient will benefit from continued hydrotherapy for selective removal of unviable tissue and promote wound bed healing.  Wound Therapy - Functional Problem List: Acute pain, decreased tolerance for position changes Factors Delaying/Impairing Wound Healing: Diabetes Mellitus;Incontinence;Immobility;Multiple medical problems;Polypharmacy Hydrotherapy Plan: Debridement;Dressing change;Pulsatile lavage with suction;Patient/family education Wound Therapy - Frequency: 6X / week Wound Therapy - Follow Up Recommendations: Skilled nursing facility Wound Plan: See above  Wound Therapy Goals- Improve the function of patient's integumentary system by progressing the wound(s) through the phases of wound healing (inflammation - proliferation - remodeling) by: Decrease Necrotic Tissue to: 20% Decrease Necrotic Tissue - Progress: Progressing toward goal Increase Granulation Tissue to: 80% Increase Granulation Tissue - Progress: Progressing toward goal Goals/treatment plan/discharge plan were made with and agreed upon by patient/family: Yes Time For Goal Achievement: 7 days Wound Therapy - Potential for Goals: Good  Goals will be updated until maximal potential achieved or discharge criteria met.  Discharge criteria: when goals achieved, discharge  from hospital, MD decision/surgical intervention, no progress towards goals, refusal/missing three consecutive treatments without notification or medical reason.  GP     Thelma Comp 06/28/2017, 12:57 PM   Rolinda Roan, PT, DPT Acute Rehabilitation Services Pager: [phone]

## 2017-06-28 NOTE — Progress Notes (Signed)
Subjective: Still on vent. Awake and responsive. No trach difficulty.  Objective: Vital signs in last 24 hours: Temp:  [97.3 F (36.3 C)-100.2 F (37.9 C)] 97.3 F (36.3 C) (06/10 0717) Pulse Rate:  [83-107] 94 (06/10 0852) Resp:  [14-25] 14 (06/10 0600) BP: (99-133)/(45-99) 121/70 (06/10 0852) SpO2:  [89 %-96 %] 93 % (06/10 0852) FiO2 (%):  [40 %] 40 % (06/10 0852) Weight:  [337 lb 4.9 oz (153 kg)] 337 lb 4.9 oz (153 kg) (06/10 0317)  Exam: General:morbidly obese male. No distress. Eyes:PERRL, clear conjunctiva. Ears: Normal auricles and EACs. Nose: Normal mucosa, septum, and turbinates. Mouth: Normal mucosa. No lesion. Neck: Severely obese.Trach midline and in place. No bleeding. Respiratory:On vent.   Recent Labs    06/27/17 0449 06/28/17 0339  WBC 8.2 8.3  HGB 9.0* 9.0*  HCT 32.3* 31.9*  PLT 271 274   Recent Labs    06/27/17 0449 06/28/17 0339  NA 139 138  K 3.6 3.6  CL 95* 92*  CO2 35* 35*  GLUCOSE 132* 120*  BUN 25* 29*  CREATININE 0.89 0.99  CALCIUM 9.0 9.1    Medications:  I have reviewed the patient's current medications. Scheduled: . chlorhexidine gluconate (MEDLINE KIT)  15 mL Mouth Rinse BID  . Chlorhexidine Gluconate Cloth  6 each Topical Daily  . clonazePAM  1 mg Per Tube BID  . collagenase   Topical Daily  . docusate  100 mg Per Tube BID  . enoxaparin (LOVENOX) injection  80 mg Subcutaneous Q24H  . famotidine  20 mg Per Tube BID  . feeding supplement (PRO-STAT SUGAR FREE 64)  60 mL Per Tube BID  . insulin aspart  3-9 Units Subcutaneous Q4H  . insulin glargine  10 Units Subcutaneous QHS  . ipratropium-albuterol  3 mL Nebulization Q6H  . mouth rinse  15 mL Mouth Rinse Q4H  . metolazone  5 mg Per Tube Daily  . mupirocin ointment  1 application Nasal BID  . QUEtiapine  100 mg Per Tube BID  . sodium chloride flush  3 mL Intravenous Q12H  . torsemide  10 mg Per Tube Daily   Continuous: . sodium chloride 250 mL (06/26/17 1616)  .  cefTRIAXone (ROCEPHIN)  IV Stopped (06/27/17 1250)  . dexmedetomidine (PRECEDEX) IV infusion Stopped (06/24/17 1100)  . feeding supplement (VITAL HIGH PROTEIN) 1,000 mL (06/26/17 1401)    Assessment/Plan: POD #11s/p trach. Venting well via trach. No bleeding. - Continue to wean vent support as tolerated. -Continueroutinetrach care. - Will change trach later this week.    LOS: 19 days   Aliviya Schoeller W Gates Jividen 06/28/2017, 8:55 AM

## 2017-06-28 NOTE — Progress Notes (Addendum)
PULMONARY / CRITICAL CARE MEDICINE   Name: Timothy Le MRN: [id number] DOB: 03-26-1960    ADMISSION DATE:  06/09/2017  CHIEF COMPLAINT:  Dyspnea  HISTORY OF PRESENT ILLNESS:   57 y/o male with morbid obesity admitted on 5/22, intubated not long afterwards in the setting of hypercarbia due to obesity, CHF and bronchitis.  Had a tracheostomy on 5/30.  SUBJECTIVE:  Tolerating PS 8 PEEP 8 today for 5 hours so far. Seems to be tiring somewhat with RR 35. I/O 1.3L neg last 24 hours.  VITAL SIGNS: BP 122/68   Pulse 95   Temp (!) 97.3 F (36.3 C)   Resp 15   Ht 5\' 6"  (1.676 m)   Wt (!) 153 kg (337 lb 4.9 oz)   SpO2 92%   BMI 54.44 kg/m   HEMODYNAMICS:    VENTILATOR SETTINGS: Vent Mode: CPAP;PSV FiO2 (%):  [40 %] 40 % Set Rate:  [15 bmp] 15 bmp Vt Set:  [510 mL] 510 mL PEEP:  [8 cmH20] 8 cmH20 Pressure Support:  [8 cmH20-10 cmH20] 8 cmH20 Plateau Pressure:  [19 cmH20-25 cmH20] 25 cmH20  INTAKE / OUTPUT: I/O last 3 completed shifts: In: 2310 [I.V.:290; NG/GT:1920; IV Piggyback:100] Out: 3695 [Urine:3695]  PHYSICAL EXAMINATION: General: morbidly obese middle aged male in NAD on vent HEENT: Tracheostomy in place, clean, dry, intact Neuro: Awake, oriented, interacts appropriately and follows commands.  CV: Distant. RRR. No appreciable MRG PULM: Decreased bilateral breath sounds GI: Massively obese, no apparent tenderness, positive bowel sounds Extremities: 1+ bilateral pedal edema Skin: No rash, unstageable decubitus wound has ongoing [place] and surgical evaluation.   LABS:  BMET Recent Labs  Lab 06/26/17 0429 06/27/17 0449 06/28/17 0339  NA 141 139 138  K 3.3* 3.6 3.6  CL 97* 95* 92*  CO2 36* 35* 35*  BUN 26* 25* 29*  CREATININE 1.00 0.89 0.99  GLUCOSE 137* 132* 120*    Electrolytes Recent Labs  Lab 06/23/17 0449 06/24/17 0405 06/25/17 0420 06/26/17 0429 06/27/17 0449 06/28/17 0339  CALCIUM 8.4* 8.8* 9.1 8.7* 9.0 9.1  MG 1.6* 1.7 2.0  --  2.0 1.8   PHOS 3.6 3.8  --   --   --  3.2    CBC Recent Labs  Lab 06/24/17 0405 06/27/17 0449 06/28/17 0339  WBC 6.7 8.2 8.3  HGB 9.1* 9.0* 9.0*  HCT 32.5* 32.3* 31.9*  PLT 223 271 274    Coag's No results for input(s): APTT, INR in the last 168 hours.  Sepsis Markers Recent Labs  Lab 06/23/17 0449  PROCALCITON 0.12    ABG No results for input(s): PHART, PCO2ART, PO2ART in the last 168 hours.  Liver Enzymes Recent Labs  Lab 06/23/17 0449  ALBUMIN 2.1*    Cardiac Enzymes No results for input(s): TROPONINI, PROBNP in the last 168 hours.  Glucose Recent Labs  Lab 06/27/17 1608 06/27/17 1935 06/28/17 0019 06/28/17 0430 06/28/17 0716 06/28/17 1134  GLUCAP 130* 118* 127* 122* 125* 130*    Imaging No results found.   STUDIES:  07/2016 PFT> no airflow obstruction, severe restriction. 5/23 Echo> LVEF 55-60%, otherwise poor visualization  CULTURES: Wound 6/4 E. Coli R to cipro  ANTIBIOTICS: 5/22 levaquin > 5/24 CTX 6/8 >>>  SIGNIFICANT EVENTS: 06/22/2017 surgical debridement of sacral wound per surgery  LINES/TUBES: 5/23 ETT > 5/30 5/30 tracheostomy >   DISCUSSION: 57 y/o male with morbid obesity and CHF who has acute on chronic respiratory failure in the setting of bronchitis and likely CHF.  Now s/p tracheostomy.  Surgical wound was debrided with surgery on 06/22/2017, still following. Course complicated by difficulty weaning felt secondary to body habitus and volume overload.  ASSESSMENT / PLAN:  PULMONARY A: Acute respiratory failure with hypercarbia Obesity hypoventilation syndrome Acute bronchitis resolve P:   Continue trach care Continue efforts at pressure support as he is able to tolerate.  Goal is to get the trach collar although unsuccessful thus far.  Keep PEEP at 8 due to body habitus.  Diurese as tolerated with daily dosing.    CARDIOVASCULAR A:  Chronic diastolic heart failure P:  Continue torsemide Dosing metolazone based on volume  status, renal function.  Will give a dose today and reassess 6/11 Tight blood pressure control  RENAL A:   Hypokalemia P:   Follow urine output, BMP Replace electrolytes as indicated Adjusting diuretics daily as described above.  GASTROINTESTINAL A:   No acute issues P:   SUP famotidine as ordered Tube feeding as ordered. Appreciate surgery assistance, to be evaluated this week for PEG placement.  HEMATOLOGIC A:   Anemia, no bleeding P:  Goal Hgb > 7.0  INFECTIOUS A:   Acute bronchitis resolved Sacral wound without evidence active infxn, but no culture from swab did identify few E. coli Debridement on one occasion 6/4 Fever 6/8 P:   Continue ceftriaxone for possible sacral wound infection. Culture + for E. coli Appreciate surgery, WOC assistance. Planning to evaluate wound further today.  ENDOCRINE A:   Mild hyperglycemia P:   Lantus 10 units Sliding scale insulin per protocol  DERM A: Appreciate wound care and general surgery input P: Debridement of sacral area performed 06/22/2017 by CCS Planning to reassess today Continue Santyl, hydrotherapy  NEUROLOGIC A:   Intermittent agitation P:   Scheduled clonazepam, Seroquel Establish good sleep/wake cycle   FAMILY  - Updates: Patient updated. Wife phone number in chart inaccurate. [phone]  Unlikely to liberate from the mechanical ventilator. Suspect even in the best case scenario he will require nocturnal ventilation. Would need an LTAc but he does not have insurance coverage for this.   Georgann Housekeeper, AGACNP-BC [place] [facility]/Critical Care Pager [phone] or [phone]  06/28/2017 12:47 PM  Attending Note:  57 year old male with extensive PMH who presents with respiratory failure that is now trached.  Continues to have surgical interventions for wound infection.  He failed weaning this AM with increased rate and low Tv.  I reviewed CXR myself, trach is in good position with low lung  volumes noted.  Will continue weaning efforts.  Abx as ordered.  CCS following for wound debridement.  PT evaluation.  Continue TF.  Will need PEG, CCS will evaluate for now.  PCCM will continue to follow.  The patient is critically ill with multiple organ systems failure and requires high complexity decision making for assessment and support, frequent evaluation and titration of therapies, application of advanced monitoring technologies and extensive interpretation of multiple databases.   Critical Care Time devoted to patient care services described in this note is  32  Minutes. This time reflects time of care of this signee Dr Jennet Maduro. This critical care time does not reflect procedure time, or teaching time or supervisory time of PA/NP/Med student/Med Resident etc but could involve care discussion time.  Rush Farmer, M.D. [facility] Pulmonary/Critical Care Medicine. Pager: [phone]. After hours pager: [phone].

## 2017-06-28 NOTE — Progress Notes (Signed)
[facility]/Trauma Progress Note  11 Days Post-Op   Assessment/Plan Active Problems:   Acute respiratory failure (HCC)   Respiratory distress   Respiratory failure with hypoxia and hypercapnia (HCC)   Difficult airway for intubation   Pressure injury of skin   ARDS (adult respiratory distress syndrome) (HCC)  Gluteal pressure wound, stage IV - cont hydro and santyl. - may need debridement in OR, will discuss with MD  FEN - tube feeds VTE - Lovenox 80mg  ID - none    LOS: 19 days    Subjective: CC: sacral wound  trached on vent  Objective: Vital signs in last 24 hours: Temp:  [97.3 F (36.3 C)-100.2 F (37.9 C)] 97.3 F (36.3 C) (06/10 0717) Pulse Rate:  [83-107] 94 (06/10 0852) Resp:  [14-25] 15 (06/10 0800) BP: (99-133)/(45-99) 121/70 (06/10 0852) SpO2:  [89 %-96 %] 93 % (06/10 0852) FiO2 (%):  [40 %] 40 % (06/10 0857) Weight:  [153 kg (337 lb 4.9 oz)] 153 kg (337 lb 4.9 oz) (06/10 0317) Last BM Date: 06/25/17  Intake/Output from previous day: 06/09 0701 - 06/10 0700 In: 1590 [I.V.:170; NG/GT:1320; IV Piggyback:100] Out: 2925 [Urine:2925] Intake/Output this shift: Total I/O In: 50 [NG/GT:50] Out: 150 [Urine:150]  PE: GU: sacral wound see below, foul odor Skin: warm and dry     Anti-infectives: Anti-infectives (From admission, onward)   Start     Dose/Rate Route Frequency Ordered Stop   06/26/17 1200  cefTRIAXone (ROCEPHIN) 1 g in sodium chloride 0.9 % 100 mL IVPB     1 g 200 mL/hr over 30 Minutes Intravenous Every 24 hours 06/26/17 1100 07/03/17 1159   06/17/17 0900  ceFAZolin (ANCEF) 3 g in dextrose 5 % 50 mL IVPB     3 g 100 mL/hr over 30 Minutes Intravenous To Surgery 06/17/17 0858 06/17/17 0902   06/10/17 2200  levofloxacin (LEVAQUIN) IVPB 750 mg  Status:  Discontinued     750 mg 100 mL/hr over 90 Minutes Intravenous Every 24 hours 06/10/17 1031 06/11/17 1535   06/09/17 2230  levofloxacin (LEVAQUIN) IVPB 750 mg  Status:   Discontinued     750 mg 100 mL/hr over 90 Minutes Intravenous Every 24 hours 06/09/17 2120 06/10/17 0104      Lab Results:  Recent Labs    06/27/17 0449 06/28/17 0339  WBC 8.2 8.3  HGB 9.0* 9.0*  HCT 32.3* 31.9*  PLT 271 274   BMET Recent Labs    06/27/17 0449 06/28/17 0339  NA 139 138  K 3.6 3.6  CL 95* 92*  CO2 35* 35*  GLUCOSE 132* 120*  BUN 25* 29*  CREATININE 0.89 0.99  CALCIUM 9.0 9.1   PT/INR No results for input(s): LABPROT, INR in the last 72 hours. CMP     Component Value Date/Time   NA 138 06/28/2017 0339   K 3.6 06/28/2017 0339   CL 92 (L) 06/28/2017 0339   CO2 35 (H) 06/28/2017 0339   GLUCOSE 120 (H) 06/28/2017 0339   BUN 29 (H) 06/28/2017 0339   CREATININE 0.99 06/28/2017 0339   CALCIUM 9.1 06/28/2017 0339   PROT 7.8 06/10/2017 0347   ALBUMIN 2.1 (L) 06/23/2017 0449   AST 26 06/10/2017 0347   ALT 13 (L) 06/10/2017 0347   ALKPHOS 62 06/10/2017 0347   BILITOT 1.1 06/10/2017 0347   GFRNONAA >60 06/28/2017 0339   GFRAA >60 06/28/2017 0339   Lipase  No results found for: LIPASE  Studies/Results: No results found.  Kalman Drape , [facility] Surgery 06/28/2017, 10:13 AM  Pager: [phone] Mon-Wed, Friday 7:00am-4:30pm Thurs 7am-11:30am  Consults: [phone]

## 2017-06-29 ENCOUNTER — Inpatient Hospital Stay (HOSPITAL_COMMUNITY): Payer: Medicaid Other

## 2017-06-29 DIAGNOSIS — J810 Acute pulmonary edema: Secondary | ICD-10-CM

## 2017-06-29 DIAGNOSIS — J189 Pneumonia, unspecified organism: Secondary | ICD-10-CM

## 2017-06-29 DIAGNOSIS — G4733 Obstructive sleep apnea (adult) (pediatric): Secondary | ICD-10-CM

## 2017-06-29 DIAGNOSIS — J441 Chronic obstructive pulmonary disease with (acute) exacerbation: Secondary | ICD-10-CM

## 2017-06-29 DIAGNOSIS — J9691 Respiratory failure, unspecified with hypoxia: Secondary | ICD-10-CM

## 2017-06-29 LAB — GLUCOSE, CAPILLARY
GLUCOSE-CAPILLARY: 129 mg/dL — AB (ref 65–99)
GLUCOSE-CAPILLARY: 141 mg/dL — AB (ref 65–99)
Glucose-Capillary: 138 mg/dL — ABNORMAL HIGH (ref 65–99)
Glucose-Capillary: 175 mg/dL — ABNORMAL HIGH (ref 65–99)
Glucose-Capillary: 98 mg/dL (ref 65–99)
Glucose-Capillary: 154 mg/dL — ABNORMAL HIGH (ref 65–99)

## 2017-06-29 LAB — BASIC METABOLIC PANEL
ANION GAP: 11 (ref 5–15)
BUN: 36 mg/dL — ABNORMAL HIGH (ref 6–20)
CALCIUM: 9.4 mg/dL (ref 8.9–10.3)
CO2: 37 mmol/L — ABNORMAL HIGH (ref 22–32)
Chloride: 90 mmol/L — ABNORMAL LOW (ref 101–111)
Creatinine, Ser: 1.21 mg/dL (ref 0.61–1.24)
Glucose, Bld: 135 mg/dL — ABNORMAL HIGH (ref 65–99)
POTASSIUM: 3.7 mmol/L (ref 3.5–5.1)
Sodium: 138 mmol/L (ref 135–145)

## 2017-06-29 MED ORDER — ALTEPLASE 2 MG IJ SOLR
2.0000 mg | Freq: Once | INTRAMUSCULAR | Status: AC
  Administered 2017-06-29: 2 mg
  Filled 2017-06-29: qty 2

## 2017-06-29 NOTE — Progress Notes (Signed)
Physical Therapy Wound Treatment Patient Details  Name: Timothy Le MRN: [id number] Date of Birth: 03-Oct-1960  Today's Date: 06/29/2017 Time: 6415-8309 Time Calculation (min): 39 min  Subjective  Subjective: Pt awake and alert on vent.  Patient and Family Stated Goals: None stated Prior Treatments: surgical debridement 6/4  Pain Score: Pt premedicated but still appeared very painful; crying at times. Treatment limited to pt's tolerance.   Wound Assessment  Pressure Injury Stage II -  Partial thickness loss of dermis presenting as a shallow open ulcer with a red, pink wound bed without slough. 2 CM x 2CM excoriated  (Active)  Wound Image   06/23/2017 11:20 AM  Dressing Type ABD;Barrier Film (skin prep);Gauze (Comment);Moist to dry 06/29/2017 10:57 AM  Dressing Clean;Dry;Intact 06/29/2017 10:57 AM  Dressing Change Frequency Daily 06/29/2017 10:57 AM  State of Healing Eschar 06/29/2017 10:57 AM  Site / Wound Assessment Red;Black;Yellow 06/29/2017 10:57 AM  % Wound base Red or Granulating 45% 06/29/2017 10:57 AM  % Wound base Yellow/Fibrinous Exudate 30% 06/29/2017 10:57 AM  % Wound base Black/Eschar 25% 06/29/2017 10:57 AM  % Wound base Other/Granulation Tissue (Comment) 0% 06/29/2017 10:57 AM  Peri-wound Assessment Intact 06/29/2017 10:57 AM  Wound Length (cm) 9 cm 06/23/2017 11:20 AM  Wound Width (cm) 8 cm 06/23/2017 11:20 AM  Wound Depth (cm) 1.2 cm 06/23/2017 11:20 AM  Wound Surface Area (cm^2) 72 cm^2 06/23/2017 11:20 AM  Wound Volume (cm^3) 86.4 cm^3 06/23/2017 11:20 AM  Tunneling (cm) 0 06/23/2017 11:20 AM  Undermining (cm) 0 06/23/2017 11:20 AM  Margins Unattached edges (unapproximated) 06/29/2017 10:57 AM  Drainage Amount Moderate 06/29/2017 10:57 AM  Drainage Description Odor;Purulent 06/29/2017 10:57 AM  Treatment Debridement (Selective);Hydrotherapy (Pulse lavage);Packing (Saline gauze) 06/29/2017 10:57 AM   Santyl ordered and had not arrived on the unit at the time of hydrotherapy. RN to apply  at next dressing change.    Hydrotherapy Pulsed lavage therapy - wound location: central buttocks Pulsed Lavage with Suction (psi): 8 psi(4 at times due to pain) Pulsed Lavage with Suction - Normal Saline Used: 1000 mL Pulsed Lavage Tip: Tip with splash shield Selective Debridement Selective Debridement - Location: central buttocks Selective Debridement - Tools Used: Forceps;Scalpel;Scissors Selective Debridement - Tissue Removed: Black necrotic tissue   Wound Assessment and Plan  Wound Therapy - Assess/Plan/Recommendations Wound Therapy - Clinical Statement: Pt premedicated ~1 hour prior to session but pt was very painful and crying during treatment. Overall looks improved today with more pre-granulation tissue noted. This patient will benefit from continued hydrotherapy for selective removal of nonviable tissue, and to promote wound bed healing.  Wound Therapy - Functional Problem List: Acute pain, decreased tolerance for position changes Factors Delaying/Impairing Wound Healing: Diabetes Mellitus;Incontinence;Immobility;Multiple medical problems;Polypharmacy Hydrotherapy Plan: Debridement;Dressing change;Pulsatile lavage with suction;Patient/family education Wound Therapy - Frequency: 6X / week Wound Therapy - Follow Up Recommendations: Skilled nursing facility Wound Plan: See above  Wound Therapy Goals- Improve the function of patient's integumentary system by progressing the wound(s) through the phases of wound healing (inflammation - proliferation - remodeling) by: Decrease Necrotic Tissue to: 20% Decrease Necrotic Tissue - Progress: Progressing toward goal Increase Granulation Tissue to: 80% Increase Granulation Tissue - Progress: Progressing toward goal Goals/treatment plan/discharge plan were made with and agreed upon by patient/family: Yes Time For Goal Achievement: 7 days Wound Therapy - Potential for Goals: Good  Goals will be updated until maximal potential achieved or  discharge criteria met.  Discharge criteria: when goals achieved, discharge from hospital, MD decision/surgical intervention,  no progress towards goals, refusal/missing three consecutive treatments without notification or medical reason.  GP     Thelma Comp 06/29/2017, 11:04 AM  Rolinda Roan, PT, DPT Acute Rehabilitation Services Pager: [phone]

## 2017-06-29 NOTE — Progress Notes (Signed)
Patient ID: Timothy Le, male   DOB: 24-Jan-1960, 57 y.o.   MRN: [id number]   Percutaneous gastric tube request  Will obtain CT Abd to evaluate anatomy If approved by IR Radiologist, will move ahead asap

## 2017-06-29 NOTE — Progress Notes (Addendum)
PULMONARY / CRITICAL CARE MEDICINE   Name: Timothy Le MRN: [id number] DOB: 16-Aug-1960    ADMISSION DATE:  06/09/2017  CHIEF COMPLAINT:  Dyspnea  HISTORY OF PRESENT ILLNESS:   57 y/o male with morbid obesity admitted on 5/22, intubated not long afterwards in the setting of hypercarbia due to obesity, CHF and bronchitis.  Had a tracheostomy on 5/30.  SUBJECTIVE:  On PSV: 40%, 5/5. (been on 5/5 since 1100) Sleeping, arouses to voice and follow commands  VITAL SIGNS: BP 126/72   Pulse (!) 109   Temp 99.3 F (37.4 C) (Oral)   Resp (!) 31   Ht 5\' 6"  (1.676 m)   Wt (!) 338 lb 13.6 oz (153.7 kg)   SpO2 92%   BMI 54.69 kg/m   HEMODYNAMICS:    VENTILATOR SETTINGS: Vent Mode: CPAP;PSV FiO2 (%):  [40 %] 40 % Set Rate:  [15 bmp] 15 bmp Vt Set:  [510 mL] 510 mL PEEP:  [5 cmH20-8 cmH20] 5 cmH20 Pressure Support:  [5 cmH20-10 cmH20] 5 cmH20 Plateau Pressure:  [20 cmH20-21 cmH20] 21 cmH20  INTAKE / OUTPUT: I/O last 3 completed shifts: In: 2260 [I.V.:360; NG/GT:1800; IV Piggyback:100] Out: 8250 [Urine:3375]  PHYSICAL EXAMINATION: General: Morbidly obese male, laying in bed, trached, on vent, sleeping, in no acute distress HEENT:  Tracheostomy in place, clean, dry, intact, Atraumatic, normocephalic Neuro: Arouses to voice, follows commands, mouths words, interacts appropriately, no focal deficits  CV: RRR, No M/R/G PULM: Diminished breath sounds throughout, even, non-labored, no assessory muscle use  GI: Morbidly obese, non-tender, soft, BS+ x4  Extremities: No deformities, active ROM all extremities, 1+ bilateral LE edema Skin: Dry, warm.  No rash.  Unstagable decubitus wound with ongoing WOC and surgical evaluation    LABS:  BMET Recent Labs  Lab 06/27/17 0449 06/28/17 0339 06/29/17 0338  NA 139 138 138  K 3.6 3.6 3.7  CL 95* 92* 90*  CO2 35* 35* 37*  BUN 25* 29* 36*  CREATININE 0.89 0.99 1.21  GLUCOSE 132* 120* 135*    Electrolytes Recent Labs  Lab  06/23/17 0449 06/24/17 0405 06/25/17 0420  06/27/17 0449 06/28/17 0339 06/29/17 0338  CALCIUM 8.4* 8.8* 9.1   < > 9.0 9.1 9.4  MG 1.6* 1.7 2.0  --  2.0 1.8  --   PHOS 3.6 3.8  --   --   --  3.2  --    < > = values in this interval not displayed.    CBC Recent Labs  Lab 06/24/17 0405 06/27/17 0449 06/28/17 0339  WBC 6.7 8.2 8.3  HGB 9.1* 9.0* 9.0*  HCT 32.5* 32.3* 31.9*  PLT 223 271 274    Coag's No results for input(s): APTT, INR in the last 168 hours.  Sepsis Markers Recent Labs  Lab 06/23/17 0449  PROCALCITON 0.12    ABG No results for input(s): PHART, PCO2ART, PO2ART in the last 168 hours.  Liver Enzymes Recent Labs  Lab 06/23/17 0449  ALBUMIN 2.1*    Cardiac Enzymes No results for input(s): TROPONINI, PROBNP in the last 168 hours.  Glucose Recent Labs  Lab 06/28/17 1601 06/28/17 1959 06/28/17 2352 06/29/17 0407 06/29/17 0804 06/29/17 1139  GLUCAP 147* 131* 141* 129* 138* 175*    Imaging No results found.   STUDIES:  07/2016 PFT> no airflow obstruction, severe restriction. 5/23 Echo> LVEF 55-60%, otherwise poor visualization  CULTURES: Wound 6/4 E. Coli R to cipro  ANTIBIOTICS: 5/22 levaquin > 5/24 CTX 6/8 >>>  SIGNIFICANT  EVENTS: 06/22/2017 surgical debridement of sacral wound per surgery  LINES/TUBES: 5/23 ETT > 5/30 5/30 tracheostomy >  5/28 RUE PICC>>  DISCUSSION: 57 y/o male with morbid obesity and CHF who has acute on chronic respiratory failure in the setting of bronchitis and likely CHF.  Now s/p tracheostomy.  Surgical wound was debrided with surgery on 06/22/2017, still following. Course complicated by difficulty weaning felt secondary to body habitus and volume overload.  ASSESSMENT / PLAN:  PULMONARY A: Acute respiratory failure with hypercarbia Obesity hypoventilation syndrome Acute bronchitis resolve P:   PRVC, 8 cc/kg SBT / PSV as tolerated.  Goal is to wean to trach collar, has not been successful thus far.    Wean PEEP / FiO2 as tolerated Continue trach care VAP protocol Continue Duonebs   CARDIOVASCULAR A:  Chronic diastolic heart failure P:  Continue Torsemide Tight BP control Received metolazone x1 dose on 6/10   RENAL A:   Hypokalemia>>resolved P:   Follow urine output / I&O's Follow BMP Replace electrolytes as indicated Ensure adequate renal perfusion  GASTROINTESTINAL A:   No acute issues P:   Pepcid for SUP Continue Tube feeds, rate adjustment per dietician Appreciate surgery assistance, to be evaluated this week for PEG placement>>per surgery recommendations, consider IR for PEG placement (body habitus too great for surgical approach)  HEMATOLOGIC A:   Anemia, no bleeding P:  Monitor for s/sx of bleeding Follow CBC Lovenox for VTE prophylaxis Transfuse for Hgb <7  INFECTIOUS A:   Acute bronchitis resolved Sacral wound without evidence active infxn, but no culture from swab did identify few E. coli Debridement on one occasion 6/4 Fever 6/8 P:   Monitor fever curve Trend WBC's Continue Ceftriaxone for possible sacral wound infection (wound culture + for E.coli) Appreciate surgery, WOC assistance   ENDOCRINE A:   Mild hyperglycemia P:   CBC's SSI Continue Lantus 10 units Follow Hypo/Hyperglycemia protocol  DERM A: Appreciate wound care and general surgery input P: Debridement of sacral area performed 06/22/17 by CCS Continue Santyl, Hydrotherapy  NEUROLOGIC A:   Intermittent agitation P:   Provide supportive care Continue scheduled clonazepam, Seroquel Lights on during the day Establish sleep/wake cycle   FAMILY  - Updates: No family present 6/11. Patient updated 6/11. Wife phone number in chart inaccurate. [phone]  Unlikely to liberate from the mechanical ventilator. Suspect even in the best case scenario he will require nocturnal ventilation. Would need an LTAc but he does not have insurance coverage for this.    Darel Hong, AGACNP-BC [facility] 06/29/2017 12:49 PM  Attending Note:  58 year old male with extensive PMH who presents to [facility] with acute respiratory failure requiring tracheostomy.  On exam, trach is in good position and lungs with coarse BS diffusely but patient is weaning on 5/5 currently, did not tolerate TC this AM.  I reviewed CXR myself, trach is in good position and pulmonary edema noted.  Discussed with PCCM-NP.  Acute respiratory failure:             - Attempt weaning down to 5/5 but patient continues to fail  Acute pulmonary edema:             - Diureses as ordered now that her BP is improving             - Strict I/O             - Daily weights  Trach status:             -  ENT to address when trach needs to be changed  Hypoxemia:             - Titrate O2 for sat of 88-92%.  HCAP:             - Rocephin             - F/U on cultures  Will transfer to SDU and to [place] with PCCM following for vent management.  Patient seen and examined, agree with above note.  I dictated the care and orders written for this patient under my direction.  Rush Farmer, [place]

## 2017-06-29 NOTE — Progress Notes (Signed)
Family Medicine social note  Family Medicine will assume care for patient in am of 6/12.  Guadalupe Dawn MD PGY-1 Family Medicine Resident

## 2017-06-29 NOTE — Progress Notes (Signed)
Subjective: Still on vent, but awake and responsive. Unable to be weaned.  Objective: Vital signs in last 24 hours: Temp:  [99.6 F (37.6 C)-100.3 F (37.9 C)] 99.7 F (37.6 C) (06/11 0408) Pulse Rate:  [86-104] 90 (06/11 0600) Resp:  [10-28] 21 (06/11 0812) BP: (88-124)/(48-78) 101/57 (06/11 0812) SpO2:  [89 %-96 %] 91 % (06/11 0812) FiO2 (%):  [40 %] 40 % (06/11 0815) Weight:  [338 lb 13.6 oz (153.7 kg)] 338 lb 13.6 oz (153.7 kg) (06/11 0416)  Exam: General:morbidly obese male. No distress. Eyes:PERRL, clear conjunctiva. Ears: Normal auricles and EACs. Nose: Normal mucosa, septum, and turbinates. Mouth: Normal mucosa. No lesion. Neck: Severely obese.Trach midline and in place. No bleeding. Respiratory:On vent.   Recent Labs    06/27/17 0449 06/28/17 0339  WBC 8.2 8.3  HGB 9.0* 9.0*  HCT 32.3* 31.9*  PLT 271 274   Recent Labs    06/28/17 0339 06/29/17 0338  NA 138 138  K 3.6 3.7  CL 92* 90*  CO2 35* 37*  GLUCOSE 120* 135*  BUN 29* 36*  CREATININE 0.99 1.21  CALCIUM 9.1 9.4    Medications:  I have reviewed the patient's current medications. Scheduled: . chlorhexidine gluconate (MEDLINE KIT)  15 mL Mouth Rinse BID  . Chlorhexidine Gluconate Cloth  6 each Topical Daily  . clonazePAM  1 mg Per Tube BID  . collagenase   Topical Daily  . docusate  100 mg Per Tube BID  . enoxaparin (LOVENOX) injection  80 mg Subcutaneous Q24H  . famotidine  20 mg Per Tube BID  . feeding supplement (PRO-STAT SUGAR FREE 64)  60 mL Per Tube BID  . insulin aspart  3-9 Units Subcutaneous Q4H  . insulin glargine  10 Units Subcutaneous QHS  . ipratropium-albuterol  3 mL Nebulization Q6H  . mouth rinse  15 mL Mouth Rinse Q4H  . QUEtiapine  100 mg Per Tube BID  . sodium chloride flush  3 mL Intravenous Q12H  . torsemide  10 mg Per Tube Daily   Continuous: . sodium chloride 250 mL (06/26/17 1616)  . cefTRIAXone (ROCEPHIN)  IV Stopped (06/28/17 1349)  . feeding supplement  (VITAL HIGH PROTEIN) 1,000 mL (06/29/17 4782)    Assessment/Plan: POD #12s/p trach. Venting well via trach. No bleeding. -Wean vent support as tolerated. -Continueroutinetrach care. - Will change trachto a new cuffed #7 XLT later this week.    LOS: 20 days   Hoda Hon W Herminio Kniskern 06/29/2017, 9:20 AM

## 2017-06-30 DIAGNOSIS — T884XXA Failed or difficult intubation, initial encounter: Secondary | ICD-10-CM

## 2017-06-30 DIAGNOSIS — J9691 Respiratory failure, unspecified with hypoxia: Secondary | ICD-10-CM

## 2017-06-30 DIAGNOSIS — G4733 Obstructive sleep apnea (adult) (pediatric): Secondary | ICD-10-CM

## 2017-06-30 DIAGNOSIS — J441 Chronic obstructive pulmonary disease with (acute) exacerbation: Secondary | ICD-10-CM

## 2017-06-30 LAB — CBC
HCT: 33.7 % — ABNORMAL LOW (ref 39.0–52.0)
HEMOGLOBIN: 9.6 g/dL — AB (ref 13.0–17.0)
MCH: 25.2 pg — ABNORMAL LOW (ref 26.0–34.0)
MCHC: 28.5 g/dL — AB (ref 30.0–36.0)
MCV: 88.5 fL (ref 78.0–100.0)
PLATELETS: 321 10*3/uL (ref 150–400)
RBC: 3.81 MIL/uL — ABNORMAL LOW (ref 4.22–5.81)
RDW: 17.3 % — ABNORMAL HIGH (ref 11.5–15.5)
WBC: 10.4 10*3/uL (ref 4.0–10.5)

## 2017-06-30 LAB — GLUCOSE, CAPILLARY
GLUCOSE-CAPILLARY: 119 mg/dL — AB (ref 65–99)
Glucose-Capillary: 117 mg/dL — ABNORMAL HIGH (ref 65–99)
Glucose-Capillary: 131 mg/dL — ABNORMAL HIGH (ref 65–99)
Glucose-Capillary: 133 mg/dL — ABNORMAL HIGH (ref 65–99)
Glucose-Capillary: 144 mg/dL — ABNORMAL HIGH (ref 65–99)
Glucose-Capillary: 146 mg/dL — ABNORMAL HIGH (ref 65–99)
Glucose-Capillary: 152 mg/dL — ABNORMAL HIGH (ref 65–99)

## 2017-06-30 LAB — BASIC METABOLIC PANEL
Anion gap: 9 (ref 5–15)
BUN: 45 mg/dL — AB (ref 6–20)
CHLORIDE: 89 mmol/L — AB (ref 101–111)
CO2: 39 mmol/L — AB (ref 22–32)
Calcium: 9.2 mg/dL (ref 8.9–10.3)
Creatinine, Ser: 1.13 mg/dL (ref 0.61–1.24)
GFR calc non Af Amer: >60 mL/min (ref >60)
Glucose, Bld: 126 mg/dL — ABNORMAL HIGH (ref 65–99)
Potassium: 3.4 mmol/L — ABNORMAL LOW (ref 3.5–5.1)
Sodium: 137 mmol/L (ref 135–145)

## 2017-06-30 LAB — PROTIME-INR
INR: 1.15
PROTHROMBIN TIME: 14.6 s (ref 11.4–15.2)

## 2017-06-30 MED ORDER — ADULT MULTIVITAMIN W/MINERALS CH
1.0000 | ORAL_TABLET | Freq: Every day | ORAL | Status: DC
  Administered 2017-06-30 – 2017-07-04 (×5): 1
  Filled 2017-06-30 (×6): qty 1

## 2017-06-30 MED ORDER — CLONAZEPAM 0.5 MG PO TABS
0.5000 mg | ORAL_TABLET | Freq: Two times a day (BID) | ORAL | Status: DC
  Administered 2017-06-30 – 2017-07-03 (×6): 0.5 mg
  Filled 2017-06-30 (×6): qty 1

## 2017-06-30 MED ORDER — CEFAZOLIN SODIUM-DEXTROSE 2-4 GM/100ML-% IV SOLN
2.0000 g | INTRAVENOUS | Status: DC
  Filled 2017-06-30: qty 100

## 2017-06-30 MED ORDER — LIDOCAINE HCL URETHRAL/MUCOSAL 2 % EX GEL
1.0000 "application " | Freq: Two times a day (BID) | CUTANEOUS | Status: DC | PRN
  Administered 2017-07-02 – 2017-07-14 (×2): 1 via TOPICAL
  Filled 2017-06-30 (×4): qty 5

## 2017-06-30 MED ORDER — VITAMIN C 500 MG PO TABS
250.0000 mg | ORAL_TABLET | Freq: Every day | ORAL | Status: DC
  Administered 2017-06-30 – 2017-07-15 (×16): 250 mg
  Filled 2017-06-30 (×16): qty 1

## 2017-06-30 NOTE — Progress Notes (Signed)
PULMONARY / CRITICAL CARE MEDICINE   Name: AC COLAN MRN: [id number] DOB: 03/02/1960    ADMISSION DATE:  06/09/2017  CHIEF COMPLAINT:  Dyspnea  HISTORY OF PRESENT ILLNESS:   57 y/o male with morbid obesity admitted on 5/22, intubated not long afterwards in the setting of hypercarbia due to obesity, CHF and bronchitis.  Had a tracheostomy on 5/30.  SUBJECTIVE:  Tolerating PSV at this point  VITAL SIGNS: BP (!) 91/59   Pulse 100   Temp 98.6 F (37 C) (Oral)   Resp 19   Ht 5\' 6"  (1.676 m)   Wt (!) 337 lb 8.4 oz (153.1 kg)   SpO2 100%   BMI 54.48 kg/m   HEMODYNAMICS:    VENTILATOR SETTINGS: Vent Mode: Stand-by FiO2 (%):  [40 %] 40 % Set Rate:  [16 bmp] 16 bmp Vt Set:  [510 mL] 510 mL PEEP:  [5 cmH20] 5 cmH20 Pressure Support:  [5 cmH20] 5 cmH20 Plateau Pressure:  [18 cmH20-19 cmH20] 18 cmH20  INTAKE / OUTPUT: I/O last 3 completed shifts: In: 2493.3 [I.V.:360; NG/GT:1990; IV Piggyback:143.3] Out: 3875 [Urine:3875]  PHYSICAL EXAMINATION: General: Morbidly obese today, NAD on PSV HEENT:  Trach in place and clean.  [place]/AT, PERRL, EOM-I and MMM Neuro: Arouses to voice, moving all ext CV: RRR, Nl S1/S2 and -M/R/G PULM: Coarse BS diffusely GI: Morbidly obese, non-tender, soft, BS+ x4  Extremities: No deformities, active ROM all extremities, 1+ bilateral LE edema Skin: Dry, warm.  No rash.  Unstagable decubitus wound with ongoing WOC and surgical evaluation   LABS:  BMET Recent Labs  Lab 06/28/17 0339 06/29/17 0338 06/30/17 0421  NA 138 138 137  K 3.6 3.7 3.4*  CL 92* 90* 89*  CO2 35* 37* 39*  BUN 29* 36* 45*  CREATININE 0.99 1.21 1.13  GLUCOSE 120* 135* 126*    Electrolytes Recent Labs  Lab 06/24/17 0405 06/25/17 0420  06/27/17 0449 06/28/17 0339 06/29/17 0338 06/30/17 0421  CALCIUM 8.8* 9.1   < > 9.0 9.1 9.4 9.2  MG 1.7 2.0  --  2.0 1.8  --   --   PHOS 3.8  --   --   --  3.2  --   --    < > = values in this interval not displayed.     CBC Recent Labs  Lab 06/27/17 0449 06/28/17 0339 06/30/17 0421  WBC 8.2 8.3 10.4  HGB 9.0* 9.0* 9.6*  HCT 32.3* 31.9* 33.7*  PLT 271 274 321    Coag's Recent Labs  Lab 06/30/17 1152  INR 1.15    Sepsis Markers No results for input(s): LATICACIDVEN, PROCALCITON, O2SATVEN in the last 168 hours.  ABG No results for input(s): PHART, PCO2ART, PO2ART in the last 168 hours.  Liver Enzymes No results for input(s): AST, ALT, ALKPHOS, BILITOT, ALBUMIN in the last 168 hours.  Cardiac Enzymes No results for input(s): TROPONINI, PROBNP in the last 168 hours.  Glucose Recent Labs  Lab 06/29/17 1542 06/29/17 1956 06/30/17 0017 06/30/17 0413 06/30/17 0807 06/30/17 1102  GLUCAP 98 154* 131* 119* 144* 117*    Imaging Ct Abdomen Wo Contrast  Result Date: 06/30/2017 CLINICAL DATA:  56 year old male with a history of dysphagia. Referred for gastrostomy EXAM: CT ABDOMEN WITHOUT CONTRAST TECHNIQUE: Multidetector CT imaging of the abdomen was performed following the standard protocol without IV contrast. COMPARISON:  None. FINDINGS: Lower chest: Atelectasis/scarring at the bilateral lung bases with no significant consolidation. No pleural effusion. Hepatobiliary: Unremarkable liver. Hyperdense material  layered in the dependent gallbladder, potentially sludge/microlithiasis. No inflammatory changes. Pancreas: Unremarkable pancreas Spleen: Unremarkable spleen Adrenals/Urinary Tract: Unremarkable adrenal glands. Unremarkable appearance the bilateral kidneys with no hydronephrosis or nephrolithiasis. Stomach/Bowel: Post pyloric enteric feeding tube terminates within the first portion the duodenum. Stomach is relatively decompressed. Presence of diverticula within the colon. No inflammatory changes of the colon or visualized small bowel. Vascular/Lymphatic: No significant atherosclerotic changes. No lymphadenopathy. Other: No significant body wall edema. No ventral wall hernia. Laxity of the  anterior abdominal wall at the inferior aspects of the study with no inflammation or involvement of the bowel. Musculoskeletal: No acute displaced fracture. Degenerative changes of the spine with flowing syndesmophytes. IMPRESSION: No acute CT finding of the abdomen. Likely biliary sludge/microlithiasis without evidence of acute inflammation. Post pyloric tube terminates within the first portion the duodenum. 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 Atelectasis/scarring at the lung bases. Electronically Signed   By: Corrie Mckusick D.O.   On: 06/30/2017 08:05     STUDIES:  07/2016 PFT> no airflow obstruction, severe restriction. 5/23 Echo> LVEF 55-60%, otherwise poor visualization  CULTURES: Wound 6/4 E. Coli R to cipro  ANTIBIOTICS: 5/22 levaquin > 5/24 CTX 6/8 >>>  SIGNIFICANT EVENTS: 06/22/2017 surgical debridement of sacral wound per surgery  LINES/TUBES: 5/23 ETT > 5/30 5/30 tracheostomy >  5/28 RUE PICC>>  DISCUSSION: 57 y/o male with morbid obesity and CHF who has acute on chronic respiratory failure in the setting of bronchitis and likely CHF.  Now s/p tracheostomy.  Surgical wound was debrided with surgery on 06/22/2017, still following. Course complicated by difficulty weaning felt secondary to body habitus and volume overload.  ASSESSMENT / PLAN:  57 year old male with extensive PMH who presents to [facility] with acute respiratory failure requiring tracheostomy.  On exam, trach isi n good position and lungs with coarse BS diffusely.  I reviewed CXR myself, trach is in good position.  Discussed with PCCM-NP.  Acute on chronic respiratory failure:             - Continue weaning, advance to TC as able but not today  Acute pulmonary edema:             - Continue active diureses             - Strict I/O             - Daily weights  Trach status:             - Maintain current trach type and size  Hypoxemia:             - Titrate O2 for sat of  88-92%  HCAP:             - Rocephin             - F/u on cultures  PCCM will continue to follow for vent management and trach management.  Rush Farmer, [place]

## 2017-06-30 NOTE — Progress Notes (Addendum)
Subjective: On pressure support. No trach issues.  Objective: Vital signs in last 24 hours: Temp:  [98.7 F (37.1 C)-99.5 F (37.5 C)] 98.7 F (37.1 C) (06/12 0415) Pulse Rate:  [87-109] 95 (06/12 0900) Resp:  [15-35] 32 (06/12 0900) BP: (103-131)/(59-83) 128/82 (06/12 0800) SpO2:  [89 %-100 %] 94 % (06/12 0900) FiO2 (%):  [40 %] 40 % (06/12 0853) Weight:  [337 lb 8.4 oz (153.1 kg)] 337 lb 8.4 oz (153.1 kg) (06/12 0416)  Exam: General:morbidly obese male. No distress. Eyes:PERRL, clear conjunctiva. Ears: Normal auricles and EACs. Nose: Normal mucosa, septum, and turbinates. Mouth: Normal mucosa. No lesion. Neck: Severely obese.Trach midline and in place. No bleeding. Respiratory:On vent (pressure support).   Recent Labs    06/28/17 0339 06/30/17 0421  WBC 8.3 10.4  HGB 9.0* 9.6*  HCT 31.9* 33.7*  PLT 274 321   Recent Labs    06/29/17 0338 06/30/17 0421  NA 138 137  K 3.7 3.4*  CL 90* 89*  CO2 37* 39*  GLUCOSE 135* 126*  BUN 36* 45*  CREATININE 1.21 1.13  CALCIUM 9.4 9.2    Medications:  I have reviewed the patient's current medications. Scheduled: . chlorhexidine gluconate (MEDLINE KIT)  15 mL Mouth Rinse BID  . Chlorhexidine Gluconate Cloth  6 each Topical Daily  . clonazePAM  1 mg Per Tube BID  . collagenase   Topical Daily  . docusate  100 mg Per Tube BID  . enoxaparin (LOVENOX) injection  80 mg Subcutaneous Q24H  . famotidine  20 mg Per Tube BID  . feeding supplement (PRO-STAT SUGAR FREE 64)  60 mL Per Tube BID  . insulin aspart  3-9 Units Subcutaneous Q4H  . insulin glargine  10 Units Subcutaneous QHS  . ipratropium-albuterol  3 mL Nebulization Q6H  . mouth rinse  15 mL Mouth Rinse Q4H  . QUEtiapine  100 mg Per Tube BID  . sodium chloride flush  3 mL Intravenous Q12H  . torsemide  10 mg Per Tube Daily   Continuous: . sodium chloride 10 mL/hr at 06/30/17 0900  . cefTRIAXone (ROCEPHIN)  IV Stopped (06/29/17 1313)  . feeding supplement  (VITAL HIGH PROTEIN) 50 mL/hr at 06/30/17 0900    Assessment/Plan: POD #13s/p trach. Venting well via trach.  - Continue to wean vent support as tolerated. - The trach tube is replaced with a new cuffed #7 XLT today.  -Routinetrach care. - Will sign off.      LOS: 21 days   Bonita Brindisi W Haron Beilke 06/30/2017, 9:24 AM

## 2017-06-30 NOTE — Consult Note (Signed)
Chief Complaint: Patient was seen in consultation today for percutaneous gastric tube placement Chief Complaint  Patient presents with  . Shortness of Breath   at the request of Dr Fatima Blank   Supervising Physician: Corrie Mckusick  Patient Status: [facility] - In-pt  History of Present Illness: Timothy Le is a 57 y.o. male   Admitted with Hypercarbia secondary obesity CHF; bronchitis Vent/trach 5/30  Sacral decubitus debrisedment per surgery 6/4  Slow to nonhealing wound Dysphagia Poor nutrition  Request for percutaneous gastric tube placement CCS unable to perform secondary body habitus : 337 lbs  Dr Earleen Newport has reviewed imaging and approves procedure   Past Medical History:  Diagnosis Date  . Asthma   . COPD (chronic obstructive pulmonary disease) ([place])   . Diabetes mellitus without complication ([place])   . Hypertension   . Shortness of breath dyspnea   . Sleep apnea     Past Surgical History:  Procedure Laterality Date  . TRACHEOSTOMY TUBE PLACEMENT N/A 06/17/2017   Procedure: TRACHEOSTOMY;  Surgeon: Leta Baptist, MD;  Location: [facility] OR;  Service: ENT;  Laterality: N/A;    Allergies: Patient has no known allergies.  Medications: Prior to Admission medications   Medication Sig Start Date End Date Taking? Authorizing Provider  amLODipine (NORVASC) 10 MG tablet Take 10 mg by mouth daily.   Yes [provider]  atorvastatin (LIPITOR) 10 MG tablet Take 10 mg by mouth daily.   Yes [provider]  furosemide (LASIX) 40 MG tablet Take 1 tablet (40 mg total) by mouth 2 (two) times daily. 10/10/16  Yes Virgel Manifold, MD  gemfibrozil (LOPID) 600 MG tablet Take 600 mg by mouth 2 (two) times daily before a meal. Thirty minutes before morning and evening meal.   Yes [provider]  guaiFENesin (MUCINEX) 600 MG 12 hr tablet Take 1 tablet (600 mg total) by mouth 2 (two) times daily. 06/21/16  Yes Barton Dubois, MD  insulin glargine (LANTUS) 100  UNIT/ML injection Inject 0.6 mLs (60 Units total) into the skin daily. 04/12/15  Yes Geradine Girt, DO  lisinopril-hydrochlorothiazide (PRINZIDE,ZESTORETIC) 20-25 MG tablet Take 1 tablet by mouth daily.   Yes [provider]  loratadine (CLARITIN) 10 MG tablet 1 daily for allergy while needed Patient taking differently: Take 10 mg by mouth daily as needed for allergies.  11/25/16  Yes Young, Tarri Fuller D, MD  metFORMIN (GLUCOPHAGE) 500 MG tablet Take 1,000 mg by mouth 2 (two) times daily with a meal.   Yes [provider]  blood glucose meter kit and supplies Dispense based on patient and insurance preference. Use up to four times daily as directed. (FOR ICD-9 250.00, 250.01). 03/29/15   [name], MD     Family History  Problem Relation Age of Onset  . Asthma Sister   . Asthma Other        nephew  . Hypertension Sister   . Diabetes type II Sister     Social History   Socioeconomic History  . Marital status: Legally Separated    Spouse name: Not on file  . Number of children: 1  . Years of education: Not on file  . Highest education level: Not on file  Occupational History  . Occupation: unemployed    Comment: working on [employer]  . Financial resource strain: Not on file  . Food insecurity:    Worry: Not on file    Inability: Not on file  . Transportation needs:  Medical: Not on file    Non-medical: Not on file  Tobacco Use  . Smoking status: Former Smoker    Packs/day: 0.50    Years: 0.50    Pack years: 0.25    Types: Cigarettes    Start date: 07/28/1983    Last attempt to quit: 02/27/2013    Years since quitting: 4.3  . Smokeless tobacco: Never Used  Substance and Sexual Activity  . Alcohol use: No    Comment: quit ETOH about 5 months ago-beer and liquor  . Drug use: No    Comment: quit 6 months-"pot"-smoked couple joints a week  . Sexual activity: Not on file  Lifestyle  . Physical activity:    Days per week: Not on file     Minutes per session: Not on file  . Stress: Not on file  Relationships  . Social connections:    Talks on phone: Not on file    Gets together: Not on file    Attends religious service: Not on file    Active member of club or organization: Not on file    Attends meetings of clubs or organizations: Not on file    Relationship status: Not on file  Other Topics Concern  . Not on file  Social History Narrative  . Not on file    Review of Systems: A 12 point ROS discussed and pertinent positives are indicated in the HPI above.  All other systems are negative.  Review of Systems  Respiratory:       Vent  Neurological: Positive for weakness.  Psychiatric/Behavioral: Negative for agitation.    Vital Signs: BP 128/82 (BP Location: Left Wrist)   Pulse 95   Temp 98.7 F (37.1 C) (Oral)   Resp (!) 32   Ht _0  (1.676 m)   Wt (!) 337 lb 8.4 oz (153.1 kg)   SpO2 94%   BMI 54.48 kg/m   Physical Exam  Cardiovascular: Normal rate and regular rhythm.  Pulmonary/Chest:  Vent/trach  Abdominal: Soft. He exhibits distension.  Skin: Skin is warm and dry.  Psychiatric:  Consented with niece Tameka via phone  Nursing note and vitals reviewed.   Imaging: Ct Abdomen Wo Contrast  Result Date: 06/30/2017 CLINICAL DATA:  57 year old male with a history of dysphagia. Referred for gastrostomy EXAM: CT ABDOMEN WITHOUT CONTRAST TECHNIQUE: Multidetector CT imaging of the abdomen was performed following the standard protocol without IV contrast. COMPARISON:  None. FINDINGS: Lower chest: Atelectasis/scarring at the bilateral lung bases with no significant consolidation. No pleural effusion. Hepatobiliary: Unremarkable liver. Hyperdense material layered in the dependent gallbladder, potentially sludge/microlithiasis. No inflammatory changes. Pancreas: Unremarkable pancreas Spleen: Unremarkable spleen Adrenals/Urinary Tract: Unremarkable adrenal glands. Unremarkable appearance the bilateral kidneys with  no hydronephrosis or nephrolithiasis. Stomach/Bowel: Post pyloric enteric feeding tube terminates within the first portion the duodenum. Stomach is relatively decompressed. Presence of diverticula within the colon. No inflammatory changes of the colon or visualized small bowel. Vascular/Lymphatic: No significant atherosclerotic changes. No lymphadenopathy. Other: No significant body wall edema. No ventral wall hernia. Laxity of the anterior abdominal wall at the inferior aspects of the study with no inflammation or involvement of the bowel. Musculoskeletal: No acute displaced fracture. Degenerative changes of the spine with flowing syndesmophytes. IMPRESSION: No acute CT finding of the abdomen. Likely biliary sludge/microlithiasis without evidence of acute inflammation. Post pyloric tube terminates within the first portion the duodenum. 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 Atelectasis/scarring at the lung bases. Electronically Signed   By:  Corrie Mckusick D.O.   On: 06/30/2017 08:05   Dg Chest Port 1 View  Result Date: 06/24/2017 CLINICAL DATA:  Respiratory difficulty EXAM: PORTABLE CHEST 1 VIEW COMPARISON:  06/23/2017 FINDINGS: Tracheostomy tube and feeding catheter are again seen and stable. Stable cardiomegaly is noted. Lungs are well aerated bilaterally. Improved aeration in the right base is noted. Persistent opacity in the left base with consolidation and effusion is seen and stable. No new focal abnormality is noted. IMPRESSION: Stable changes in the left base. Electronically Signed   By: Inez Catalina M.D.   On: 06/24/2017 08:58   Dg Chest Port 1 View  Result Date: 06/23/2017 CLINICAL DATA:  Respiratory failure EXAM: PORTABLE CHEST 1 VIEW COMPARISON:  Jun 17, 2017 FINDINGS: Tracheostomy catheter tip is 6.5 cm above the carina. Feeding tube tip is below the diaphragm. No pneumothorax. There is airspace consolidation left lower lobe with small pleural effusion on the left. There is  atelectatic change in the right base. There is cardiomegaly with pulmonary vascularity within normal limits. No evident adenopathy. No bone lesions. IMPRESSION: Tube positions as described without pneumothorax. Airspace consolidation most likely due to pneumonia left lower lobe with small left pleural effusion. Right base atelectasis. Stable cardiac enlargement. Electronically Signed   By: Lowella Grip III M.D.   On: 06/23/2017 08:21   Dg Chest Port 1 View  Result Date: 06/17/2017 CLINICAL DATA:  Shortness of Breath EXAM: PORTABLE CHEST 1 VIEW COMPARISON:  06/16/2017 FINDINGS: Tracheostomy tube is noted in place new from the prior exam. Nasogastric catheter has been removed. Cardiac shadow is enlarged but stable. Vascular congestion is again noted and stable. The overall inspiratory effort is poor with stable appearing infiltrative change in the left base. Small effusions are suggested as well. IMPRESSION: New tracheostomy tube in satisfactory position. Stable infiltrates in the left base. Stable vascular congestion is noted with suggestion of small effusions bilaterally. Electronically Signed   By: Inez Catalina M.D.   On: 06/17/2017 13:13   Dg Chest Port 1 View  Result Date: 06/16/2017 CLINICAL DATA:  Acute respiratory failure EXAM: PORTABLE CHEST 1 VIEW COMPARISON:  06/15/2017 and prior radiographs FINDINGS: This is a low volume study. An endotracheal tube with 4 cm above the carina, NG tube entering the stomach with tip off the field of view again identified. Cardiomegaly noted with mild pulmonary vascular congestion. LEFT LOWER lung consolidation/atelectasis and RIGHT basilar atelectasis noted. No pneumothorax. No other changes identified. IMPRESSION: No significant change. Cardiomegaly, mild pulmonary vascular congestion and LEFT LOWER lung consolidation/atelectasis. Electronically Signed   By: Margarette Canada M.D.   On: 06/16/2017 08:52   Dg Chest Port 1 View  Result Date: 06/15/2017 CLINICAL  DATA:  Respiratory distress EXAM: PORTABLE CHEST 1 VIEW COMPARISON:  06/10/2017 FINDINGS: Endotracheal tube and NG tube are unchanged. Cardiomegaly with vascular congestion. Bibasilar atelectasis. No overt edema. No visible effusions or acute bony abnormality. IMPRESSION: Cardiomegaly with vascular congestion and bibasilar atelectasis. Electronically Signed   By: [name] M.D.   On: 06/15/2017 11:21   Dg Chest Port 1 View  Result Date: 06/10/2017 CLINICAL DATA:  Respiratory distress EXAM: PORTABLE CHEST 1 VIEW COMPARISON:  06/09/2017, 01/20/2017 FINDINGS: Endotracheal tube tip is about 3.4 cm superior to the carina. Esophageal tube tip is below the diaphragm but not included. Cardiomegaly with vascular congestion and mild perihilar edema. No pleural effusion. No pneumothorax. IMPRESSION: 1. Endotracheal tube tip about 3.4 cm superior to the carina 2. Cardiomegaly with vascular congestion and mild  pulmonary edema Electronically Signed   By: Donavan Foil M.D.   On: 06/10/2017 01:38   Dg Chest Portable 1 View  Result Date: 06/09/2017 CLINICAL DATA:  Shortness of breath, COPD, asthma, diabetes mellitus, hypertension, sleep apnea, former smoker EXAM: PORTABLE CHEST 1 VIEW COMPARISON:  Portable exam 1315 hours compared to 01/20/2017 FINDINGS: Enlargement of cardiac silhouette. Prominent mediastinum light which may related to technique. Pulmonary vascularity normal. Low lung volumes with bibasilar atelectasis. Upper lungs clear. No pleural effusion or pneumothorax. IMPRESSION: Enlargement of cardiac silhouette. Low lung volumes with bibasilar atelectasis. Electronically Signed   By: Lavonia Dana M.D.   On: 06/09/2017 13:24   Dg Abd Portable 1v  Result Date: 06/10/2017 CLINICAL DATA:  OG tube placement EXAM: PORTABLE ABDOMEN - 1 VIEW COMPARISON:  03/27/2015 FINDINGS: Esophageal tube tip overlies the gastric body. Multiple dilated small and large bowel loops with small bowel measuring up to 3.5 cm.  IMPRESSION: 1. Esophageal tube tip overlies the gastric body 2. Multiple loops of dilated small and large bowel, ileus versus partial obstruction. Electronically Signed   By: Donavan Foil M.D.   On: 06/10/2017 01:40   [place] Ekg Site Rite  Result Date: 06/15/2017 If Site Rite image not attached, placement could not be confirmed due to current cardiac rhythm.   Labs:  CBC: Recent Labs    06/24/17 0405 06/27/17 0449 06/28/17 0339 06/30/17 0421  WBC 6.7 8.2 8.3 10.4  HGB 9.1* 9.0* 9.0* 9.6*  HCT 32.5* 32.3* 31.9* 33.7*  PLT 223 271 274 321    COAGS: No results for input(s): INR, APTT in the last 8760 hours.  BMP: Recent Labs    06/27/17 0449 06/28/17 0339 06/29/17 0338 06/30/17 0421  NA 139 138 138 137  K 3.6 3.6 3.7 3.4*  CL 95* 92* 90* 89*  CO2 35* 35* 37* 39*  GLUCOSE 132* 120* 135* 126*  BUN 25* 29* 36* 45*  CALCIUM 9.0 9.1 9.4 9.2  CREATININE 0.89 0.99 1.21 1.13  GFRNONAA >60 >60 >60 >60  GFRAA >60 >60 >60 >60    LIVER FUNCTION TESTS: Recent Labs    01/20/17 0900 06/09/17 1309 06/10/17 0347 06/23/17 0449  BILITOT 0.6 0.6 1.1  --   AST _0 --   ALT 12* 11* 13*  --   ALKPHOS 65 69 62  --   PROT 8.5* 8.2* 7.8  --   ALBUMIN 3.3* 3.1* 3.0* 2.1*    TUMOR MARKERS: No results for input(s): AFPTM, CEA, CA199, CHROMGRNA in the last 8760 hours.  Assessment and Plan:  Acute resp failure Non healing sacral ulcer Poor nutrition/deconditioning Dysphagia Scheduled now for percutaneous gastric tube placement  Risks and benefits discussed with the patient's niece Tameka including, but not limited to the need for a barium enema during the procedure, bleeding, infection, peritonitis, or damage to adjacent structures. All of her questions were answered, she is agreeable to proceed. Consent signed and in chart.  Thank you for this interesting consult.  I greatly enjoyed meeting SKANDA WORLDS and look forward to participating in their care.  A copy of this  report was sent to the requesting provider on this date.  Electronically Signed: Lavonia Drafts, PA-C 06/30/2017, 9:58 AM   I spent a total of 40 Minutes    in face to face in clinical consultation, greater than 50% of which was counseling/coordinating care for percutaneous gastric tube placement

## 2017-06-30 NOTE — Progress Notes (Signed)
Physical Therapy Wound Treatment Patient Details  Name: Timothy Le MRN: [id number] Date of Birth: 1960/12/11  Today's Date: 06/30/2017 Time: 1110-1145 Time Calculation (min): 35 min  Subjective  Subjective: Pt awake and alert on vent.  Patient and Family Stated Goals: None stated Prior Treatments: surgical debridement 6/4  Pain Score: Pain Score: 7   Wound Assessment  Pressure Injury Stage II -  Partial thickness loss of dermis presenting as a shallow open ulcer with a red, pink wound bed without slough. 2 CM x 2CM excoriated  (Active)  Wound Image   06/30/2017 12:19 PM  Dressing Type ABD;Barrier Film (skin prep);Gauze (Comment);Moist to dry 06/30/2017 12:19 PM  Dressing Clean;Dry;Intact;Changed 06/30/2017 12:19 PM  Dressing Change Frequency Daily 06/30/2017 12:19 PM  State of Healing Eschar 06/30/2017 12:19 PM  Site / Wound Assessment Black;Yellow;Painful;Red 06/30/2017 12:19 PM  % Wound base Red or Granulating 60% 06/30/2017 12:19 PM  % Wound base Yellow/Fibrinous Exudate 10% 06/30/2017 12:19 PM  % Wound base Black/Eschar 30% 06/30/2017 12:19 PM  % Wound base Other/Granulation Tissue (Comment) 0% 06/30/2017 12:19 PM  Peri-wound Assessment Intact 06/30/2017 12:19 PM  Wound Length (cm) 8.1 cm 06/30/2017 11:00 AM  Wound Width (cm) 8.2 cm 06/30/2017 11:00 AM  Wound Depth (cm) 2.5 cm 06/30/2017 11:00 AM  Wound Surface Area (cm^2) 66.42 cm^2 06/30/2017 11:00 AM  Wound Volume (cm^3) 166.05 cm^3 06/30/2017 11:00 AM  Tunneling (cm) 0 06/23/2017 11:20 AM  Undermining (cm) 0 06/23/2017 11:20 AM  Margins Unattached edges (unapproximated) 06/30/2017 12:19 PM  Drainage Amount Moderate 06/30/2017 12:19 PM  Drainage Description Odor;Purulent 06/30/2017 12:19 PM  Treatment Debridement (Selective);Hydrotherapy (Pulse lavage);Packing (Saline gauze) 06/30/2017 12:19 PM   Santyl applied to wound bed prior to applying dressing.     Hydrotherapy Pulsed lavage therapy - wound location: central buttocks Pulsed  Lavage with Suction (psi): 8 psi(4 at times due to pain) Pulsed Lavage with Suction - Normal Saline Used: 1000 mL Pulsed Lavage Tip: Tip with splash shield Selective Debridement Selective Debridement - Location: central buttocks Selective Debridement - Tools Used: Forceps;Scalpel Selective Debridement - Tissue Removed: Black and yellow necrotic tissue   Wound Assessment and Plan  Wound Therapy - Assess/Plan/Recommendations Wound Therapy - Clinical Statement: Pt premedicated prior to session however pt continued to be very painful with treatment and debridement was ended early as pt could not tolerate. May benefit from lidocaine cream to aide in desensitizing the wound and periwound area prior to treatment. Overall looks improved today with more pre-granulation tissue noted. This patient will benefit from continued hydrotherapy for selective removal of nonviable tissue, and to promote wound bed healing.  Wound Therapy - Functional Problem List: Acute pain, decreased tolerance for position changes Factors Delaying/Impairing Wound Healing: Diabetes Mellitus;Incontinence;Immobility;Multiple medical problems;Polypharmacy Hydrotherapy Plan: Debridement;Dressing change;Pulsatile lavage with suction;Patient/family education Wound Therapy - Frequency: 6X / week Wound Therapy - Follow Up Recommendations: Skilled nursing facility Wound Plan: See above  Wound Therapy Goals- Improve the function of patient's integumentary system by progressing the wound(s) through the phases of wound healing (inflammation - proliferation - remodeling) by: Decrease Necrotic Tissue to: 20% Decrease Necrotic Tissue - Progress: Progressing toward goal Increase Granulation Tissue to: 80% Increase Granulation Tissue - Progress: Progressing toward goal Goals/treatment plan/discharge plan were made with and agreed upon by patient/family: Yes Time For Goal Achievement: 7 days Wound Therapy - Potential for Goals: Good  Goals  will be updated until maximal potential achieved or discharge criteria met.  Discharge criteria: when goals achieved, discharge from  hospital, MD decision/surgical intervention, no progress towards goals, refusal/missing three consecutive treatments without notification or medical reason.  GP     Thelma Comp 06/30/2017, 12:25 PM   Rolinda Roan, PT, DPT Acute Rehabilitation Services Pager: [phone]

## 2017-06-30 NOTE — Consult Note (Addendum)
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 Measurement:left neck wound 1cm x 0.3cm stage II from MDRPI from trach, 100% pink. Buttocks and gluteal cleft  8cm x 4cm(hard to determine due to in the gluteal fold) 5cm deep. 20% black in deep center surrounded by 20% tan slough, the other 60% is pink and granulating. Moderate tan drainage, slightly malodorous.  Wound bed:see above Drainage (amount, consistency, odor) see above Periwound: intact Dressing procedure/placement/frequency: Continue with CCS orders for buttock/gluteal wound. Trach area wound healing well. Patient on nutritional supplements for optimal wound healing. WOC will continue to follow weekly, if need assistance prior to next visit please re-consult or page. Fara Olden, RN-C, WTA-C, [facility] Wound Treatment Associate Ostomy Care Associate

## 2017-06-30 NOTE — Progress Notes (Signed)
Family Medicine Teaching Service Daily Progress Note Intern Pager: [phone]  Patient name: Timothy Le Medical record number: [id number] Date of birth: [birth year] Age: 57 y.o. Gender: male  Primary Care Provider: Kerin Perna, NP Consultants: PCCM, IR, Surgery Code Status: full  Pt Overview and Major Events to Date:  5/22 admitted to fpts-> subsequent decompensation, xfer to pccm 5/23 intubated 5/30 Tracheostomy 6/4 bedside sacral wound debridement  Assessment and Plan: Respiratory failure with hypercarbia  Obesity hypoventilation syndrome On trach vent. Has been unable to transition to trach collar. CCM following and managing vent. Appreciate their help. Not optimistic that patient can be fully transitioned off of nocturnal ventilation any time in the near future. Will continue to try and wean to trach collar. Continuing torsemide 10mg  daily. Has received prn metolazone if swelling increases. Patient NPO 2/2 trach cent. Has been receiving pro-stat and vital high protein through the NG tube. Going to OR with IR in afternoon of 6/12 for PEG tube. Trach change this am, appreciate ENT recs. - vitals signs per stepdown routine - Trach vent, vent management per CCM, appreciate their help - continue torsemide 10mg  daily - albuterol 2.5mg  q 2 hours prn - pepcid 20mg  bid for GI ppx - NPO - pro-stat high sugar 36mL bid, vital high protein 49mL/hr through NG  Sacral wound S/P debridement by surgery on 6/4. Wound care following. Beefy red with some black eschar noted. Receiving hydrotherapy and santyl. Question if patient would benefit from starting wound vac therapy. Likely would have to be changed in OR if started due to his pain. Continue ctx.  - follow up surgery recs - Follow up wound care recs - hydrotherapy and santyl - consider wound vac therapy with q 3 days changes - continue ceftriaxone   Tracheostomy Trach change this am by ENT. Signed off. Trach care per routine.  Will try and wean off vent, and move to trach collar when able. - Likely trach size change around 6/26 - trach care per routine  Anemia Hemoglobin up to 9.6 from 9.0. Lovenox for dvt ppx. Likely due to a combination of blood loss from sacral wound, trach, and anemia of chronic disease. Transfuse if hgb falls below 7.  FEN/GI: Vital high protein liquid 22mL/hr, sugar free pro-state 63mL bid PPx: pepcid, lovenox  Disposition: unclear  Subjective:  Ok this am. No complaints. Asking about when he can get off trach vent.   Objective: Temp:  [98.7 F (37.1 C)-99.5 F (37.5 C)] 98.7 F (37.1 C) (06/12 0415) Pulse Rate:  [87-109] 95 (06/12 0900) Resp:  [15-35] 32 (06/12 0900) BP: (103-131)/(59-83) 128/82 (06/12 0800) SpO2:  [89 %-100 %] 94 % (06/12 0900) FiO2 (%):  [40 %] 40 % (06/12 0853) Weight:  [337 lb 8.4 oz (153.1 kg)] 337 lb 8.4 oz (153.1 kg) (06/12 0416) Physical Exam: General: morbidly obese, african [place] male. Resting comfortably in bed. Trach vent in place. Cardiovascular: regular rate rhythm , no mumurs/rubs/gallops. Trace to 1+ edema BLE. Respiratory: coarse rhonchi in all lungs fields. On trach vent. Abdomen: soft, non-tender, non-distended.  Extremities: 5/5 strength BUE, BLE.   Laboratory: Recent Labs  Lab 06/27/17 0449 06/28/17 0339 06/30/17 0421  WBC 8.2 8.3 10.4  HGB 9.0* 9.0* 9.6*  HCT 32.3* 31.9* 33.7*  PLT 271 274 321   Recent Labs  Lab 06/28/17 0339 06/29/17 0338 06/30/17 0421  NA 138 138 137  K 3.6 3.7 3.4*  CL 92* 90* 89*  CO2 35* 37* 39*  BUN 29*  36* 45*  CREATININE 0.99 1.21 1.13  CALCIUM 9.1 9.4 9.2  GLUCOSE 120* 135* 126*    Imaging/Diagnostic Tests: CLINICAL DATA:  57 year old male with a history of dysphagia. Referred for gastrostomy  EXAM: CT ABDOMEN WITHOUT CONTRAST  TECHNIQUE: Multidetector CT imaging of the abdomen was performed following the standard protocol without IV contrast.  COMPARISON:   None.  FINDINGS: Lower chest: Atelectasis/scarring at the bilateral lung bases with no significant consolidation. No pleural effusion.  Hepatobiliary: Unremarkable liver. Hyperdense material layered in the dependent gallbladder, potentially sludge/microlithiasis. No inflammatory changes.  Pancreas: Unremarkable pancreas  Spleen: Unremarkable spleen  Adrenals/Urinary Tract: Unremarkable adrenal glands.  Unremarkable appearance the bilateral kidneys with no hydronephrosis or nephrolithiasis.  Stomach/Bowel: Post pyloric enteric feeding tube terminates within the first portion the duodenum. Stomach is relatively decompressed.  Presence of diverticula within the colon. No inflammatory changes of the colon or visualized small bowel.  Vascular/Lymphatic: No significant atherosclerotic changes. No lymphadenopathy.  Other: No significant body wall edema. No ventral wall hernia. Laxity of the anterior abdominal wall at the inferior aspects of the study with no inflammation or involvement of the bowel.  Musculoskeletal: No acute displaced fracture. Degenerative changes of the spine with flowing syndesmophytes.  IMPRESSION: No acute CT finding of the abdomen.  Likely biliary sludge/microlithiasis without evidence of acute inflammation.  Post pyloric tube terminates within the first portion the duodenum.  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  Atelectasis/scarring at the lung bases.  Guadalupe Dawn, MD 06/30/2017, 9:37 AM PGY-1, [place] Intern pager: [phone], text pages welcome

## 2017-07-01 DIAGNOSIS — I5033 Acute on chronic diastolic (congestive) heart failure: Secondary | ICD-10-CM

## 2017-07-01 DIAGNOSIS — Z431 Encounter for attention to gastrostomy: Secondary | ICD-10-CM

## 2017-07-01 DIAGNOSIS — E118 Type 2 diabetes mellitus with unspecified complications: Secondary | ICD-10-CM

## 2017-07-01 DIAGNOSIS — Z4659 Encounter for fitting and adjustment of other gastrointestinal appliance and device: Secondary | ICD-10-CM

## 2017-07-01 DIAGNOSIS — Z794 Long term (current) use of insulin: Secondary | ICD-10-CM

## 2017-07-01 DIAGNOSIS — L89304 Pressure ulcer of unspecified buttock, stage 4: Secondary | ICD-10-CM

## 2017-07-01 LAB — BASIC METABOLIC PANEL
Anion gap: 12 (ref 5–15)
BUN: 52 mg/dL — AB (ref 6–20)
CO2: 37 mmol/L — ABNORMAL HIGH (ref 22–32)
CREATININE: 1.22 mg/dL (ref 0.61–1.24)
Calcium: 9.4 mg/dL (ref 8.9–10.3)
Chloride: 90 mmol/L — ABNORMAL LOW (ref 101–111)
GFR calc Af Amer: >60 mL/min (ref >60)
Glucose, Bld: 131 mg/dL — ABNORMAL HIGH (ref 65–99)
POTASSIUM: 3.6 mmol/L (ref 3.5–5.1)
SODIUM: 139 mmol/L (ref 135–145)

## 2017-07-01 LAB — CBC WITH DIFFERENTIAL/PLATELET
ABS IMMATURE GRANULOCYTES: 0.1 10*3/uL (ref 0.0–0.1)
Basophils Absolute: 0 10*3/uL (ref 0.0–0.1)
Basophils Relative: 0 %
Eosinophils Absolute: 0.2 10*3/uL (ref 0.0–0.7)
Eosinophils Relative: 2 %
HCT: 35.2 % — ABNORMAL LOW (ref 39.0–52.0)
Hemoglobin: 9.9 g/dL — ABNORMAL LOW (ref 13.0–17.0)
Immature Granulocytes: 1 %
Lymphocytes Relative: 23 %
Lymphs Abs: 2.5 10*3/uL (ref 0.7–4.0)
MCH: 25.3 pg — AB (ref 26.0–34.0)
MCHC: 28.1 g/dL — ABNORMAL LOW (ref 30.0–36.0)
MCV: 90 fL (ref 78.0–100.0)
MONO ABS: 0.8 10*3/uL (ref 0.1–1.0)
Monocytes Relative: 8 %
NEUTROS ABS: 6.9 10*3/uL (ref 1.7–7.7)
NEUTROS PCT: 66 %
PLATELETS: 327 10*3/uL (ref 150–400)
RBC: 3.91 MIL/uL — ABNORMAL LOW (ref 4.22–5.81)
RDW: 17.5 % — ABNORMAL HIGH (ref 11.5–15.5)
WBC: 10.6 10*3/uL — ABNORMAL HIGH (ref 4.0–10.5)

## 2017-07-01 LAB — GLUCOSE, CAPILLARY
GLUCOSE-CAPILLARY: 122 mg/dL — AB (ref 65–99)
GLUCOSE-CAPILLARY: 122 mg/dL — AB (ref 65–99)
GLUCOSE-CAPILLARY: 144 mg/dL — AB (ref 65–99)
Glucose-Capillary: 125 mg/dL — ABNORMAL HIGH (ref 65–99)
Glucose-Capillary: 129 mg/dL — ABNORMAL HIGH (ref 65–99)

## 2017-07-01 MED ORDER — METOPROLOL TARTRATE 5 MG/5ML IV SOLN: 5.0000 mg | Freq: Four times a day (QID) | INTRAVENOUS | Status: DC | PRN

## 2017-07-01 MED ORDER — METOPROLOL TARTRATE 5 MG/5ML IV SOLN
INTRAVENOUS | Status: AC
  Filled 2017-07-01: qty 5

## 2017-07-01 MED ORDER — ORAL CARE MOUTH RINSE
15.0000 mL | Freq: Every day | OROMUCOSAL | Status: DC
  Administered 2017-07-01 – 2017-07-03 (×8): 15 mL via OROMUCOSAL

## 2017-07-01 MED ORDER — METOPROLOL TARTRATE 5 MG/5ML IV SOLN
5.0000 mg | Freq: Four times a day (QID) | INTRAVENOUS | Status: DC | PRN
  Administered 2017-07-01: 5 mg via INTRAVENOUS

## 2017-07-01 NOTE — Progress Notes (Signed)
Patient ID: Timothy Le, male   DOB: Apr 21, 1960, 57 y.o.   MRN: [id number]    14 Days Post-Op  Subjective: Pt still on vent with trach.    Objective: Vital signs in last 24 hours: Temp:  [98.6 F (37 C)-99.4 F (37.4 C)] 99.4 F (37.4 C) (06/13 0828) Pulse Rate:  [63-118] 87 (06/13 0838) Resp:  [14-40] 24 (06/13 0838) BP: (82-135)/(48-91) 117/78 (06/13 0838) SpO2:  [90 %-100 %] 93 % (06/13 0847) FiO2 (%):  [40 %] 40 % (06/13 0847) Weight:  [150.6 kg (332 lb 0.2 oz)] 150.6 kg (332 lb 0.2 oz) (06/13 0500) Last BM Date: 06/29/17  Intake/Output from previous day: 06/12 0701 - 06/13 0700 In: 1253 [I.V.:233; NG/GT:1020] Out: 2165 [Urine:2165] Intake/Output this shift: Total I/O In: -  Out: 325 [Urine:325]  PE: Skin: wound is cleaning up much better.  Deep part of wound still with some necrotic tissue, but making great strides in beefy red granulation tissue.     Lab Results:  Recent Labs    06/30/17 0421 07/01/17 0502  WBC 10.4 10.6*  HGB 9.6* 9.9*  HCT 33.7* 35.2*  PLT 321 327   BMET Recent Labs    06/30/17 0421 07/01/17 0502  NA 137 139  K 3.4* 3.6  CL 89* 90*  CO2 39* 37*  GLUCOSE 126* 131*  BUN 45* 52*  CREATININE 1.13 1.22  CALCIUM 9.2 9.4   PT/INR Recent Labs    06/30/17 1152  LABPROT 14.6  INR 1.15   CMP     Component Value Date/Time   NA 139 07/01/2017 0502   K 3.6 07/01/2017 0502   CL 90 (L) 07/01/2017 0502   CO2 37 (H) 07/01/2017 0502   GLUCOSE 131 (H) 07/01/2017 0502   BUN 52 (H) 07/01/2017 0502   CREATININE 1.22 07/01/2017 0502   CALCIUM 9.4 07/01/2017 0502   PROT 7.8 06/10/2017 0347   ALBUMIN 2.1 (L) 06/23/2017 0449   AST 26 06/10/2017 0347   ALT 13 (L) 06/10/2017 0347   ALKPHOS 62 06/10/2017 0347   BILITOT 1.1 06/10/2017 0347   GFRNONAA >60 07/01/2017 0502   GFRAA >60 07/01/2017 0502   Lipase  No results found for: LIPASE     Studies/Results: Ct Abdomen Wo Contrast  Result Date: 06/30/2017 CLINICAL DATA:   57 year old male with a history of dysphagia. Referred for gastrostomy EXAM: CT ABDOMEN WITHOUT CONTRAST TECHNIQUE: Multidetector CT imaging of the abdomen was performed following the standard protocol without IV contrast. COMPARISON:  None. FINDINGS: Lower chest: Atelectasis/scarring at the bilateral lung bases with no significant consolidation. No pleural effusion. Hepatobiliary: Unremarkable liver. Hyperdense material layered in the dependent gallbladder, potentially sludge/microlithiasis. No inflammatory changes. Pancreas: Unremarkable pancreas Spleen: Unremarkable spleen Adrenals/Urinary Tract: Unremarkable adrenal glands. Unremarkable appearance the bilateral kidneys with no hydronephrosis or nephrolithiasis. Stomach/Bowel: Post pyloric enteric feeding tube terminates within the first portion the duodenum. Stomach is relatively decompressed. Presence of diverticula within the colon. No inflammatory changes of the colon or visualized small bowel. Vascular/Lymphatic: No significant atherosclerotic changes. No lymphadenopathy. Other: No significant body wall edema. No ventral wall hernia. Laxity of the anterior abdominal wall at the inferior aspects of the study with no inflammation or involvement of the bowel. Musculoskeletal: No acute displaced fracture. Degenerative changes of the spine with flowing syndesmophytes. IMPRESSION: No acute CT finding of the abdomen. Likely biliary sludge/microlithiasis without evidence of acute inflammation. Post pyloric tube terminates within the first portion the duodenum. 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  Atelectasis/scarring at the lung bases. Electronically Signed   By: Corrie Mckusick D.O.   On: 06/30/2017 08:05    Anti-infectives: Anti-infectives (From admission, onward)   Start     Dose/Rate Route Frequency Ordered Stop   07/01/17 0600  ceFAZolin (ANCEF) IVPB 2g/100 mL premix     2 g 200 mL/hr over 30 Minutes Intravenous To Radiology  06/30/17 1016 07/02/17 0600   06/26/17 1200  cefTRIAXone (ROCEPHIN) 1 g in sodium chloride 0.9 % 100 mL IVPB     1 g 200 mL/hr over 30 Minutes Intravenous Every 24 hours 06/26/17 1100 07/03/17 1159   06/17/17 0900  ceFAZolin (ANCEF) 3 g in dextrose 5 % 50 mL IVPB     3 g 100 mL/hr over 30 Minutes Intravenous To Surgery 06/17/17 0858 06/17/17 0902   06/10/17 2200  levofloxacin (LEVAQUIN) IVPB 750 mg  Status:  Discontinued     750 mg 100 mL/hr over 90 Minutes Intravenous Every 24 hours 06/10/17 1031 06/11/17 1535   06/09/17 2230  levofloxacin (LEVAQUIN) IVPB 750 mg  Status:  Discontinued     750 mg 100 mL/hr over 90 Minutes Intravenous Every 24 hours 06/09/17 2120 06/10/17 0104       Assessment/Plan Active Problems:   Acute respiratory failure (HCC)   Respiratory distress   Respiratory failure with hypoxia and hypercapnia (HCC)   Difficult airway for intubation   Pressure injury of skin   ARDS (adult respiratory distress syndrome) (HCC)  Gluteal pressure wound,stage IV - cont hydro and santyl. - making good progress with hydro.  No OR needs.  Cont enzymatic and nonexcisional wound debridement. -wound cultures revealed E.coli, on rocephin for 7 days.  FEN -tube feeds VTE -Lovenox 80mg  ID -Rocephin 06/26/17 --> for 7 doses   LOS: 22 days    Henreitta Cea , [facility] Surgery 07/01/2017, 9:39 AM Pager: [phone]

## 2017-07-01 NOTE — Progress Notes (Signed)
Nutrition Follow-up  DOCUMENTATION CODES:   Morbid obesity  INTERVENTION:   Continue same TF after PEG placed tomorrow with:  Vital High Protein at 50 ml/h (1200 ml per day)  Pro-stat 60 ml BID  Provides 1600 kcal, 165 gm protein, 1003 ml free water daily  NUTRITION DIAGNOSIS:   Inadequate oral intake related to inability to eat as evidenced by NPO status.  Ongoing  GOAL:   Provide needs based on ASPEN/SCCM guidelines  Met with TF  MONITOR:   Vent status, Labs, I & O's  ASSESSMENT:   57 yo male with PMH of morbid obesity, asthma, HTN, sleep apnea, COPD, and DM who was admitted on 5/22 with acute respiratory failure, COPD exacerbation, likely new onset HF. Required intubation shortly after admission.  Discussed patient with RN today. TF is currently off for PEG placement which has been rescheduled for tomorrow morning, so RN will resume TF for today. It will be held at midnight for procedure tomorrow. Currently TF orders: Vital High Protein via Cortrak (post-pyloric tip) at 50 ml/h (1200 ml/day) with Prostat 60 ml BID to provide 1600 kcals, 165 gm protein, 1003 ml free water daily.  Patient is currently intubated on ventilator support MV: 11 L/min Temp (24hrs), Avg:99 F (37.2 C), Min:98.6 F (37 C), Max:99.4 F (37.4 C)  Propofol: none  Diet Order:   Diet Order           Diet NPO time specified Except for: Sips with Meds  Diet effective midnight        Diet NPO time specified  Diet effective midnight          EDUCATION NEEDS:   No education needs have been identified at this time  Skin:  Skin Assessment: Skin Integrity Issues: Skin Integrity Issues:: Stage II, Stage III Stage II: buttocks-midline gluteal wound Stage III: neck (medical device related PI from trach)  Last BM:  6/11 (type 5)  Height:   Ht Readings from Last 1 Encounters:  06/10/17 _0  (1.676 m)    Weight:   Wt Readings from Last 1 Encounters:  07/01/17 (!) 332 lb 0.2 oz  (150.6 kg)    Ideal Body Weight:  64.5 kg  BMI:  Body mass index is 53.59 kg/m.  Estimated Nutritional Needs:   Kcal:  7654-6503  Protein:  161 gm  Fluid:  2 L    Molli Barrows, RD, LDN, [place] Pager [phone] After Hours Pager [phone]

## 2017-07-01 NOTE — Progress Notes (Signed)
CSW continues to follow. CSW aware that pt is trach'd and remains on vent at 40 percent. CSW following for further dispsotion needs in the event that pt is unable to wean from vent. CSW has spoken with pt's sister in the past and she expressed being agreeable to pt possibly needing out of state placement in the event that pt is not able to come off of the vent.   Virgie Dad Danitra Payano, MSW, [facility] Clinical Social Worker [phone]

## 2017-07-01 NOTE — Progress Notes (Deleted)
Family Medicine Teaching Service Daily Progress Note Intern Pager: [phone]  Patient name: Timothy Le Medical record number: [id number] Date of birth: [birth year] Age: 57 y.o. Gender: male  Primary Care Provider: Kerin Perna, NP Consultants: PCCM, IR, Surgery Code Status: full  Pt Overview and Major Events to Date:  5/22 admitted to fpts-> subsequent decompensation, xfer to pccm 5/23 intubated 5/30 Tracheostomy 6/4 bedside sacral wound debridement  Assessment and Plan: Respiratory failure with hypercarbia  Obesity hypoventilation syndrome On trach vent. Has been unable to transition to trach collar. CCM following and managing vent. Appreciate their help. Not optimistic that patient can be fully transitioned off of nocturnal ventilation any time in the near future. Will continue to try and wean to trach collar. Continuing torsemide 10mg  daily. Has received prn metolazone if swelling increases. Patient NPO 2/2 trach cent. Has been receiving pro-stat and vital high protein through the NG tube. Going to OR with IR in afternoon of 6/12 for PEG tube. Trach change this am, appreciate ENT recs. - vitals signs per stepdown routine - Trach vent, vent management per CCM, appreciate their help - continue torsemide 10mg  daily - albuterol 2.5mg  q 2 hours prn - pepcid 20mg  bid for GI ppx - NPO - pro-stat high sugar 77mL bid, vital high protein 1mL/hr through NG  Sacral wound S/P debridement by surgery on 6/4. Wound care following. Continue ctx.  - follow up surgery recs - Follow up wound care recs - hydrotherapy and santyl - consider wound vac therapy with q 3 days changes - continue ceftriaxone   Tracheostomy Trach change ton 6/12 by ENT. Signed off. Trach care per routine. Will try and wean off vent, and move to trach collar when able. - Likely trach size change around 6/26 - trach care per routine  Anemia Hemoglobin up to 10.6 from 9.0. Lovenox for dvt ppx. Likely due to a  combination of blood loss from sacral wound, trach, and anemia of chronic disease. Transfuse if hgb falls below 7.  FEN/GI: Vital high protein liquid 21mL/hr, sugar free pro-state 32mL bid PPx: pepcid, lovenox  Disposition: unclear  Subjective:  Ok this am. No complaints. Asking about when he can get off trach vent.   Objective: Temp:  [98.6 F (37 C)-99.4 F (37.4 C)] 99.1 F (37.3 C) (06/13 1143) Pulse Rate:  [63-118] 87 (06/13 0838) Resp:  [14-40] 24 (06/13 0838) BP: (82-135)/(48-87) 117/78 (06/13 0838) SpO2:  [90 %-100 %] 93 % (06/13 0847) FiO2 (%):  [40 %] 40 % (06/13 0847) Weight:  [332 lb 0.2 oz (150.6 kg)] 332 lb 0.2 oz (150.6 kg) (06/13 0500) Physical Exam: General: morbidly obese, african [place] male. Resting comfortably in bed. Trach vent in place. Cardiovascular: regular rate rhythm , no mumurs/rubs/gallops. Trace to 1+ edema BLE. Respiratory: coarse rhonchi in all lungs fields. On trach vent. Abdomen: soft, non-tender, non-distended.  Extremities: 5/5 strength BUE, BLE.   Laboratory: Recent Labs  Lab 06/28/17 0339 06/30/17 0421 07/01/17 0502  WBC 8.3 10.4 10.6*  HGB 9.0* 9.6* 9.9*  HCT 31.9* 33.7* 35.2*  PLT 274 321 327   Recent Labs  Lab 06/29/17 0338 06/30/17 0421 07/01/17 0502  NA 138 137 139  K 3.7 3.4* 3.6  CL 90* 89* 90*  CO2 37* 39* 37*  BUN 36* 45* 52*  CREATININE 1.21 1.13 1.22  CALCIUM 9.4 9.2 9.4  GLUCOSE 135* 126* 131*    Imaging/Diagnostic Tests: CLINICAL DATA:  57 year old male with a history of dysphagia. Referred for gastrostomy  EXAM: CT ABDOMEN WITHOUT CONTRAST  TECHNIQUE: Multidetector CT imaging of the abdomen was performed following the standard protocol without IV contrast.  COMPARISON:  None.  FINDINGS: Lower chest: Atelectasis/scarring at the bilateral lung bases with no significant consolidation. No pleural effusion.  Hepatobiliary: Unremarkable liver. Hyperdense material layered in the dependent  gallbladder, potentially sludge/microlithiasis. No inflammatory changes.  Pancreas: Unremarkable pancreas  Spleen: Unremarkable spleen  Adrenals/Urinary Tract: Unremarkable adrenal glands.  Unremarkable appearance the bilateral kidneys with no hydronephrosis or nephrolithiasis.  Stomach/Bowel: Post pyloric enteric feeding tube terminates within the first portion the duodenum. Stomach is relatively decompressed.  Presence of diverticula within the colon. No inflammatory changes of the colon or visualized small bowel.  Vascular/Lymphatic: No significant atherosclerotic changes. No lymphadenopathy.  Other: No significant body wall edema. No ventral wall hernia. Laxity of the anterior abdominal wall at the inferior aspects of the study with no inflammation or involvement of the bowel.  Musculoskeletal: No acute displaced fracture. Degenerative changes of the spine with flowing syndesmophytes.  IMPRESSION: No acute CT finding of the abdomen.  Likely biliary sludge/microlithiasis without evidence of acute inflammation.  Post pyloric tube terminates within the first portion the duodenum.  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  Atelectasis/scarring at the lung bases.  Guadalupe Dawn, MD 07/01/2017, 12:01 PM PGY-1, [place] Intern pager: [phone], text pages welcome

## 2017-07-01 NOTE — Progress Notes (Signed)
Pt coughing continuously, requested to be suction, sats dropped, possible mucous plug. RRT called. Pt eyes rolled back and became unresponsive. HR 170's, in and out of Afib. Pt bagged/ suctioned and lavaged by RRT. ELink called. Pt now responsive.   Sats dropped again RRT called back to room. ELink MD on camera. Pt lavaged again per MD request. Warren Lacy MD advised to continue to watch pt HR, Afib may resolve after pt calms down and MD stated will check back later. Will continue to monitor.

## 2017-07-01 NOTE — Progress Notes (Signed)
PT placed on 40% ATC by RT. VS stable and RN aware.

## 2017-07-01 NOTE — Progress Notes (Addendum)
Pt HR 130-140's Afib sustained. ELink called. Verbal orders received during downtime from MD Elsworth Soho for Lopressor 5mg  Q6H prn for HR greater than 130.

## 2017-07-01 NOTE — Progress Notes (Addendum)
Family Medicine Teaching Service Daily Progress Note Intern Pager: [phone]  Patient name: Timothy Le Medical record number: [id number] Date of birth: [birth year] Age: 57 y.o. Gender: male  Primary Care Provider: Kerin Perna, NP Consultants: PCCM, IR, Surgery Code Status: full  Pt Overview and Major Events to Date:  5/22 admitted to fpts-> subsequent decompensation, xfer to pccm 5/23 intubated 5/30 Tracheostomy 6/4 bedside sacral wound debridement  Assessment and Plan: Respiratory failure with hypercarbia  Obesity hypoventilation syndrome On trach vent. Has been unable to transition off of trach vent. CCM following and managing vent. Appreciate their help. Not optimistic that patient can be fully transitioned off of nocturnal ventilation any time in the near future. Will continue to try and wean to trach collar for even day time use. Continuing torsemide 10mg  daily. Patient NPO 2/2 trach vent. Has been receiving pro-stat and vital high protein through the NG tube. Planned to go OR with IR in afternoon of 6/13 for PEG tube. S/p trach changed on 6/12 by ENT. - vitals signs per stepdown routine - Trach vent, vent management per CCM, appreciate their help - continue torsemide 10mg  daily - prn metolazone if becomes volume overloaded again - albuterol 2.5mg  q 2 hours prn - pepcid 20mg  bid for GI ppx - NPO - pro-stat high sugar 75mL bid, vital high protein 55mL/hr through NG  Sacral wound S/P debridement by surgery on 6/4. Surgery and Wound care following. Beefy red with some black eschar noted. Receiving hydrotherapy and santyl. Continue ceftriaxone for 7 day course for Ecoli in wound. - follow up surgery recs - Follow up wound care recs - hydrotherapy and santyl - consider wound vac therapy with q 3 days changes - continue ceftriaxone (6/8 - )  Tracheostomy Trach change on 6/12 by ENT. Signed off. Trach care per routine. Will try and wean off vent, and move to trach  collar when able. - Likely trach size change around 6/26 - trach care per routine  Anemia Hemoglobin up to 9.9 from 9.6. Lovenox for dvt ppx. Likely due to a combination of blood loss from sacral wound, trach, and anemia of chronic disease. Transfuse if hgb falls below 7.  FEN/GI: Vital high protein liquid 71mL/hr, sugar free pro-state 52mL bid PPx: pepcid, lovenox  Disposition: unclear  Subjective:  Ok this am. Complained of being cold when in the room.  Objective: Temp:  [98.6 F (37 C)-99.4 F (37.4 C)] 99.1 F (37.3 C) (06/13 1143) Pulse Rate:  [63-118] 109 (06/13 1224) Resp:  [14-40] 20 (06/13 1224) BP: (86-135)/(52-87) 89/67 (06/13 1224) SpO2:  [90 %-100 %] 100 % (06/13 1224) FiO2 (%):  [40 %] 40 % (06/13 1224) Weight:  [332 lb 0.2 oz (150.6 kg)] 332 lb 0.2 oz (150.6 kg) (06/13 0500) Physical Exam: General: morbidly obese, AA male. Resting comfortably in bed. Trach vent in place. Cardiovascular: regular rate rhythm , no mumurs/rubs/gallops. Trace to 1+ edema BLE. Respiratory: coarse rhonchi in all lungs fields. On trach vent. Abdomen: soft, non-tender, non-distended.  Extremities: 5/5 strength BUE, BLE.   Laboratory: Recent Labs  Lab 06/28/17 0339 06/30/17 0421 07/01/17 0502  WBC 8.3 10.4 10.6*  HGB 9.0* 9.6* 9.9*  HCT 31.9* 33.7* 35.2*  PLT 274 321 327   Recent Labs  Lab 06/29/17 0338 06/30/17 0421 07/01/17 0502  NA 138 137 139  K 3.7 3.4* 3.6  CL 90* 89* 90*  CO2 37* 39* 37*  BUN 36* 45* 52*  CREATININE 1.21 1.13 1.22  CALCIUM 9.4  9.2 9.4  GLUCOSE 135* 126* 131*    Imaging/Diagnostic Tests: CLINICAL DATA:  57 year old male with a history of dysphagia. Referred for gastrostomy  EXAM: CT ABDOMEN WITHOUT CONTRAST  TECHNIQUE: Multidetector CT imaging of the abdomen was performed following the standard protocol without IV contrast.  COMPARISON:  None.  FINDINGS: Lower chest: Atelectasis/scarring at the bilateral lung bases with no  significant consolidation. No pleural effusion.  Hepatobiliary: Unremarkable liver. Hyperdense material layered in the dependent gallbladder, potentially sludge/microlithiasis. No inflammatory changes.  Pancreas: Unremarkable pancreas  Spleen: Unremarkable spleen  Adrenals/Urinary Tract: Unremarkable adrenal glands.  Unremarkable appearance the bilateral kidneys with no hydronephrosis or nephrolithiasis.  Stomach/Bowel: Post pyloric enteric feeding tube terminates within the first portion the duodenum. Stomach is relatively decompressed.  Presence of diverticula within the colon. No inflammatory changes of the colon or visualized small bowel.  Vascular/Lymphatic: No significant atherosclerotic changes. No lymphadenopathy.  Other: No significant body wall edema. No ventral wall hernia. Laxity of the anterior abdominal wall at the inferior aspects of the study with no inflammation or involvement of the bowel.  Musculoskeletal: No acute displaced fracture. Degenerative changes of the spine with flowing syndesmophytes.  IMPRESSION: No acute CT finding of the abdomen.  Likely biliary sludge/microlithiasis without evidence of acute inflammation.  Post pyloric tube terminates within the first portion the duodenum.  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  Atelectasis/scarring at the lung bases.  Guadalupe Dawn, MD 07/01/2017, 1:58 PM PGY-1, [place] Intern pager: [phone], text pages welcome

## 2017-07-01 NOTE — Progress Notes (Signed)
Physical Therapy Wound Treatment Patient Details  Name: Timothy Le MRN: [id number] Date of Birth: Jul 28, 1960  Today's Date: 07/01/2017 Time: 2585-2778 Time Calculation (min): 36 min  Subjective  Subjective: Pt awake and alert on vent.  Patient and Family Stated Goals: None stated Prior Treatments: surgical debridement 6/4  Pain Score:  Pt appeared to be very uncomfortable during session and debridement was limited by pain  Wound Assessment  Pressure Injury Stage II -  Partial thickness loss of dermis presenting as a shallow open ulcer with a red, pink wound bed without slough. 2 CM x 2CM excoriated  (Active)  Wound Image   06/30/2017 12:19 PM  Dressing Type ABD;Barrier Film (skin prep);Gauze (Comment);Moist to dry 07/01/2017  3:46 PM  Dressing Changed;Clean;Dry;Intact 07/01/2017  3:46 PM  Dressing Change Frequency Daily 07/01/2017  3:46 PM  State of Healing Eschar 07/01/2017  3:46 PM  Site / Wound Assessment Red;Brown;Yellow 07/01/2017  3:46 PM  % Wound base Red or Granulating 60% 07/01/2017  3:46 PM  % Wound base Yellow/Fibrinous Exudate 10% 07/01/2017  3:46 PM  % Wound base Black/Eschar 30% 07/01/2017  3:46 PM  % Wound base Other/Granulation Tissue (Comment) 0% 07/01/2017  3:46 PM  Peri-wound Assessment Intact 07/01/2017  3:46 PM  Wound Length (cm) 8.1 cm 06/30/2017 11:00 AM  Wound Width (cm) 8.2 cm 06/30/2017 11:00 AM  Wound Depth (cm) 2.5 cm 06/30/2017 11:00 AM  Wound Surface Area (cm^2) 66.42 cm^2 06/30/2017 11:00 AM  Wound Volume (cm^3) 166.05 cm^3 06/30/2017 11:00 AM  Tunneling (cm) 0 06/23/2017 11:20 AM  Undermining (cm) 0 06/23/2017 11:20 AM  Margins Unattached edges (unapproximated) 07/01/2017  3:46 PM  Drainage Amount Moderate 07/01/2017  3:46 PM  Drainage Description Odor;Purulent 07/01/2017  3:46 PM  Treatment Debridement (Selective);Hydrotherapy (Pulse lavage);Packing (Saline gauze) 07/01/2017  3:46 PM   Santyl applied to wound bed prior to applying dressing.      Hydrotherapy Pulsed lavage therapy - wound location: central buttocks Pulsed Lavage with Suction (psi): 8 psi(4 at times due to pain) Pulsed Lavage with Suction - Normal Saline Used: 1000 mL Pulsed Lavage Tip: Tip with splash shield Selective Debridement Selective Debridement - Location: central buttocks Selective Debridement - Tools Used: Forceps;Scalpel Selective Debridement - Tissue Removed: Black and yellow necrotic tissue   Wound Assessment and Plan  Wound Therapy - Assess/Plan/Recommendations Wound Therapy - Clinical Statement: Debridement again limited by pain. Lidocaine gel did not appear to be helpful.  This patient will benefit from continued hydrotherapy to decrease bioburden and promote wound bed healing.  Wound Therapy - Functional Problem List: Acute pain, decreased tolerance for position changes Factors Delaying/Impairing Wound Healing: Diabetes Mellitus;Incontinence;Immobility;Multiple medical problems;Polypharmacy Hydrotherapy Plan: Debridement;Dressing change;Pulsatile lavage with suction;Patient/family education Wound Therapy - Frequency: 6X / week Wound Therapy - Follow Up Recommendations: Skilled nursing facility Wound Plan: See above  Wound Therapy Goals- Improve the function of patient's integumentary system by progressing the wound(s) through the phases of wound healing (inflammation - proliferation - remodeling) by: Decrease Necrotic Tissue to: 20% Decrease Necrotic Tissue - Progress: Progressing toward goal Increase Granulation Tissue to: 80% Increase Granulation Tissue - Progress: Progressing toward goal Goals/treatment plan/discharge plan were made with and agreed upon by patient/family: Yes Time For Goal Achievement: 7 days Wound Therapy - Potential for Goals: Good  Goals will be updated until maximal potential achieved or discharge criteria met.  Discharge criteria: when goals achieved, discharge from hospital, MD decision/surgical intervention, no  progress towards goals, refusal/missing three consecutive treatments without notification or  medical reason.  GP     Thelma Comp 07/01/2017, 3:49 PM   Rolinda Roan, PT, DPT Acute Rehabilitation Services Pager: [phone]

## 2017-07-02 ENCOUNTER — Inpatient Hospital Stay: Payer: Self-pay

## 2017-07-02 DIAGNOSIS — Z431 Encounter for attention to gastrostomy: Secondary | ICD-10-CM

## 2017-07-02 LAB — BASIC METABOLIC PANEL
Anion gap: 12 (ref 5–15)
BUN: 52 mg/dL — ABNORMAL HIGH (ref 6–20)
CALCIUM: 9.6 mg/dL (ref 8.9–10.3)
CHLORIDE: 93 mmol/L — AB (ref 101–111)
CO2: 34 mmol/L — AB (ref 22–32)
Creatinine, Ser: 1.21 mg/dL (ref 0.61–1.24)
GFR calc non Af Amer: >60 mL/min (ref >60)
GLUCOSE: 138 mg/dL — AB (ref 65–99)
Potassium: 3.4 mmol/L — ABNORMAL LOW (ref 3.5–5.1)
Sodium: 139 mmol/L (ref 135–145)

## 2017-07-02 LAB — CBC WITH DIFFERENTIAL/PLATELET
ABS IMMATURE GRANULOCYTES: 0.1 10*3/uL (ref 0.0–0.1)
Basophils Absolute: 0 10*3/uL (ref 0.0–0.1)
Basophils Relative: 0 %
Eosinophils Absolute: 0.2 10*3/uL (ref 0.0–0.7)
Eosinophils Relative: 2 %
HEMATOCRIT: 36.1 % — AB (ref 39.0–52.0)
HEMOGLOBIN: 10.3 g/dL — AB (ref 13.0–17.0)
Immature Granulocytes: 1 %
LYMPHS ABS: 2.3 10*3/uL (ref 0.7–4.0)
Lymphocytes Relative: 27 %
MCH: 25.5 pg — ABNORMAL LOW (ref 26.0–34.0)
MCHC: 28.5 g/dL — ABNORMAL LOW (ref 30.0–36.0)
MCV: 89.4 fL (ref 78.0–100.0)
MONO ABS: 0.8 10*3/uL (ref 0.1–1.0)
MONOS PCT: 9 %
NEUTROS ABS: 4.9 10*3/uL (ref 1.7–7.7)
NEUTROS PCT: 59 %
PLATELETS: 337 10*3/uL (ref 150–400)
RBC: 4.04 MIL/uL — ABNORMAL LOW (ref 4.22–5.81)
RDW: 17.7 % — ABNORMAL HIGH (ref 11.5–15.5)
WBC: 8.3 10*3/uL (ref 4.0–10.5)

## 2017-07-02 LAB — GLUCOSE, CAPILLARY
GLUCOSE-CAPILLARY: 131 mg/dL — AB (ref 65–99)
GLUCOSE-CAPILLARY: 125 mg/dL — AB (ref 65–99)
Glucose-Capillary: 111 mg/dL — ABNORMAL HIGH (ref 65–99)
Glucose-Capillary: 119 mg/dL — ABNORMAL HIGH (ref 65–99)
Glucose-Capillary: 122 mg/dL — ABNORMAL HIGH (ref 65–99)
Glucose-Capillary: 142 mg/dL — ABNORMAL HIGH (ref 65–99)
Glucose-Capillary: 150 mg/dL — ABNORMAL HIGH (ref 65–99)

## 2017-07-02 MED ORDER — CHLORHEXIDINE GLUCONATE CLOTH 2 % EX PADS
6.0000 | MEDICATED_PAD | Freq: Every day | CUTANEOUS | Status: DC
  Administered 2017-07-02 – 2017-07-15 (×12): 6 via TOPICAL

## 2017-07-02 MED ORDER — ALTEPLASE 2 MG IJ SOLR
2.0000 mg | Freq: Once | INTRAMUSCULAR | Status: AC
  Administered 2017-07-02: 2 mg
  Filled 2017-07-02: qty 2

## 2017-07-02 MED ORDER — POTASSIUM CHLORIDE 10 MEQ/100ML IV SOLN
10.0000 meq | Freq: Once | INTRAVENOUS | Status: AC
  Administered 2017-07-02: 10 meq via INTRAVENOUS
  Filled 2017-07-02: qty 100

## 2017-07-02 MED ORDER — ENOXAPARIN SODIUM 80 MG/0.8ML ~~LOC~~ SOLN
0.5000 mg/kg | SUBCUTANEOUS | Status: DC
  Administered 2017-07-02 – 2017-07-03 (×2): 75 mg via SUBCUTANEOUS
  Filled 2017-07-02 (×2): qty 0.75

## 2017-07-02 MED ORDER — SODIUM CHLORIDE 0.9% FLUSH
10.0000 mL | Freq: Two times a day (BID) | INTRAVENOUS | Status: DC
  Administered 2017-07-02 (×2): 10 mL
  Administered 2017-07-03: 20 mL
  Administered 2017-07-03 – 2017-07-08 (×8): 10 mL
  Administered 2017-07-08 – 2017-07-09 (×2): 20 mL
  Administered 2017-07-09 – 2017-07-10 (×4): 10 mL
  Administered 2017-07-11: 20 mL
  Administered 2017-07-11 – 2017-07-15 (×5): 10 mL

## 2017-07-02 MED ORDER — SODIUM CHLORIDE 0.9% FLUSH
10.0000 mL | INTRAVENOUS | Status: DC | PRN
  Administered 2017-07-05: 40 mL
  Filled 2017-07-02: qty 40

## 2017-07-02 MED ORDER — IPRATROPIUM-ALBUTEROL 0.5-2.5 (3) MG/3ML IN SOLN
3.0000 mL | RESPIRATORY_TRACT | Status: DC | PRN
Start: 2017-07-02 — End: 2017-07-15

## 2017-07-02 NOTE — Progress Notes (Signed)
PULMONARY / CRITICAL CARE MEDICINE   Name: MATTOX SCHORR MRN: [id number] DOB: 10-05-1960    ADMISSION DATE:  06/09/2017  CHIEF COMPLAINT:  Dyspnea  HISTORY OF PRESENT ILLNESS:   57 yo male with altered mental status from acute on chronic hypoxic/hypercapnic respiratory failure, CHF exacerbation.  Required intubation.  Failed to wean and required tracheostomy.  SUBJECTIVE:  Tolerates pressure support.  VITAL SIGNS: BP 136/83   Pulse (!) 104   Temp 98.1 F (36.7 C) (Oral)   Resp (!) 23   Ht 5\' 6"  (1.676 m)   Wt (!) 330 lb 0.5 oz (149.7 kg)   SpO2 95%   BMI 53.27 kg/m   VENTILATOR SETTINGS: Vent Mode: PSV;CPAP FiO2 (%):  [35 %-40 %] 40 % Set Rate:  [15 bmp] 15 bmp Vt Set:  [510 mL] 510 mL PEEP:  [5 cmH20] 5 cmH20 Pressure Support:  [12 cmH20] 12 cmH20 Plateau Pressure:  [17 cmH20-19 cmH20] 19 cmH20  INTAKE / OUTPUT: I/O last 3 completed shifts: In: 1458 [I.V.:313; NG/GT:945; IV Piggyback:200] Out: 2705 [Urine:2705]  PHYSICAL EXAMINATION:  General - pleasant Eyes - pupils reactive ENT - mild skin erosion at trach site Cardiac - regular, no murmur Chest - decreased BS Abd - soft, non tender Ext - 1+ edema Skin - sacral wound Neuro - follows commands  LABS:  BMET Recent Labs  Lab 06/30/17 0421 07/01/17 0502 07/02/17 0755  NA 137 139 139  K 3.4* 3.6 3.4*  CL 89* 90* 93*  CO2 39* 37* 34*  BUN 45* 52* 52*  CREATININE 1.13 1.22 1.21  GLUCOSE 126* 131* 138*    Electrolytes Recent Labs  Lab 06/27/17 0449 06/28/17 0339  06/30/17 0421 07/01/17 0502 07/02/17 0755  CALCIUM 9.0 9.1   < > 9.2 9.4 9.6  MG 2.0 1.8  --   --   --   --   PHOS  --  3.2  --   --   --   --    < > = values in this interval not displayed.    CBC Recent Labs  Lab 06/30/17 0421 07/01/17 0502 07/02/17 0755  WBC 10.4 10.6* 8.3  HGB 9.6* 9.9* 10.3*  HCT 33.7* 35.2* 36.1*  PLT 321 327 337    Coag's Recent Labs  Lab 06/30/17 1152  INR 1.15    Sepsis Markers No  results for input(s): LATICACIDVEN, PROCALCITON, O2SATVEN in the last 168 hours.  ABG No results for input(s): PHART, PCO2ART, PO2ART in the last 168 hours.  Liver Enzymes No results for input(s): AST, ALT, ALKPHOS, BILITOT, ALBUMIN in the last 168 hours.  Cardiac Enzymes No results for input(s): TROPONINI, PROBNP in the last 168 hours.  Glucose Recent Labs  Lab 07/01/17 1149 07/01/17 1625 07/01/17 2005 07/01/17 2333 07/02/17 0417 07/02/17 0746  GLUCAP 129* 122* 144* 150* 111* 131*    Imaging [place] Ekg Site Rite  Result Date: 07/02/2017 If Site Rite image not attached, placement could not be confirmed due to current cardiac rhythm.    STUDIES:  07/31/16 PFT > no airflow obstruction, severe restriction. 06/10/17 Echo> LVEF 55-60%, otherwise poor visualization  CULTURES: Sacral wound 6/04 >> E coli Sputum 6/04 >> Corynebacterium  ANTIBIOTICS: 5/22 levaquin > 5/24 6/08 Rocephin >  LINES/TUBES: 5/23 ETT > 5/30 5/28 RUE PICC >  5/30 tracheostomy >    ASSESSMENT / PLAN:  Acute on chronic hypoxic/hypercapnic respiratory failure with OSA/OHS, morbid obesity. Tracheostomy status. - pressure support wean to trach collar during day as  able - vent support at night - wound care to trach site - prn BDs  HFpEF. - negative fluid balance as able  Stage IV sacral wound. - per primary team and CCS - rocephin per primary team  PCCM will f/u on Monday June 17.  Call if help needed sooner.  Chesley Mires, MD [facility] Pulmonary/Critical Care 07/02/2017, 11:46 AM

## 2017-07-02 NOTE — Progress Notes (Signed)
Physical Therapy Wound Treatment Patient Details  Name: OLANDO WILLEMS MRN: [id number] Date of Birth: [birth year]  Today's Date: 07/02/2017 Time: 4315-4008 Time Calculation (min): 40 min  Subjective  Subjective: Pt awake and alert on vent.  Patient and Family Stated Goals: None stated Prior Treatments: surgical debridement 6/4  Pain Score: Pt reported 10/10 pain on entry and given IV pain medication prior to hydrotherapy session. Pt able to tolerate moderate debridement today.  Wound Assessment  Pressure Injury Stage II -  Partial thickness loss of dermis presenting as a shallow open ulcer with a red, pink wound bed without slough. 2 CM x 2CM excoriated  (Active)  Wound Image   06/30/2017 12:19 PM  Dressing Type ABD;Barrier Film (skin prep);Gauze (Comment);Moist to dry 07/02/2017  1:00 PM  Dressing Changed;Clean;Dry;Intact 07/02/2017  1:00 PM  Dressing Change Frequency Daily 07/02/2017  1:00 PM  State of Healing Early/partial granulation 07/02/2017  1:00 PM  Site / Wound Assessment Red;Brown;Yellow 07/02/2017  1:00 PM  % Wound base Red or Granulating 60% 07/02/2017  1:00 PM  % Wound base Yellow/Fibrinous Exudate 10% 07/02/2017  1:00 PM  % Wound base Black/Eschar 30% 07/02/2017  1:00 PM  % Wound base Other/Granulation Tissue (Comment) 0% 07/02/2017  1:00 PM  Peri-wound Assessment Intact 07/02/2017  1:00 PM  Wound Length (cm) 8.1 cm 06/30/2017 11:00 AM  Wound Width (cm) 8.2 cm 06/30/2017 11:00 AM  Wound Depth (cm) 2.5 cm 06/30/2017 11:00 AM  Wound Surface Area (cm^2) 66.42 cm^2 06/30/2017 11:00 AM  Wound Volume (cm^3) 166.05 cm^3 06/30/2017 11:00 AM  Tunneling (cm) 0 06/23/2017 11:20 AM  Undermining (cm) 0 06/23/2017 11:20 AM  Margins Unattached edges (unapproximated) 07/02/2017  1:00 PM  Drainage Amount Moderate 07/02/2017  1:00 PM  Drainage Description Odor;Purulent 07/02/2017  1:00 PM  Treatment Debridement (Selective);Hydrotherapy (Pulse lavage);Packing (Saline gauze) 07/02/2017  1:00 PM   Santyl  applied to necrotic tissue   Hydrotherapy Pulsed lavage therapy - wound location: central buttocks Pulsed Lavage with Suction (psi): 8 psi(4 at times due to pain) Pulsed Lavage with Suction - Normal Saline Used: 1000 mL Pulsed Lavage Tip: Tip with splash shield Selective Debridement Selective Debridement - Location: central buttocks Selective Debridement - Tools Used: Forceps;Scalpel Selective Debridement - Tissue Removed: Black and yellow necrotic tissue   Wound Assessment and Plan  Wound Therapy - Assess/Plan/Recommendations Wound Therapy - Clinical Statement: Pt given IV pain medication just prior to treatment. Wound bed also treated with Lidocaine gel. O2 sat dropped to 75%O2 while weaning, RN notified and pt placed back on full ventilator support. SaO2 rebounded to 94%O2.  Pt was able to tolerate a moderate amount of debridement today. This patient will benefit from continued hydrotherapy to decrease bioburden and promote wound bed healing.  Wound Therapy - Functional Problem List: Acute pain, decreased tolerance for position changes Factors Delaying/Impairing Wound Healing: Diabetes Mellitus;Incontinence;Immobility;Multiple medical problems;Polypharmacy Hydrotherapy Plan: Debridement;Dressing change;Pulsatile lavage with suction;Patient/family education Wound Therapy - Frequency: 6X / week Wound Therapy - Follow Up Recommendations: Skilled nursing facility Wound Plan: See above  Wound Therapy Goals- Improve the function of patient's integumentary system by progressing the wound(s) through the phases of wound healing (inflammation - proliferation - remodeling) by: Decrease Necrotic Tissue to: 20% Increase Granulation Tissue to: 80% Goals/treatment plan/discharge plan were made with and agreed upon by patient/family: Yes Time For Goal Achievement: 7 days Wound Therapy - Potential for Goals: Good  Goals will be updated until maximal potential achieved or discharge criteria met.   Discharge  criteria: when goals achieved, discharge from hospital, MD decision/surgical intervention, no progress towards goals, refusal/missing three consecutive treatments without notification or medical reason.  GP    Dani Gobble. Migdalia Dk PT, DPT Acute Rehabilitation  [phone] Pager [phone]   [place] 07/02/2017, 1:10 PM

## 2017-07-02 NOTE — Progress Notes (Signed)
Unable to proceed with GT placement today due to pt receiving Lovenox 6/12 at 2205

## 2017-07-02 NOTE — Progress Notes (Addendum)
Patient ID: Timothy Le, male   DOB: 1960-07-17, 57 y.o.   MRN: [id number]  Pt now scheduled for G tube in IR 6/17 Mon  New orders in place

## 2017-07-02 NOTE — Progress Notes (Signed)
IR has been called twice in regards to pts PEG placement that was scheduled for today. Emergencies have taken place in the IR and no time was given if the pt would be seen. Pt still remains NPO and no VTE prophylaxis. Will continue to monitor pt.

## 2017-07-02 NOTE — Progress Notes (Signed)
Patient ID: Timothy Le, male   DOB: [birth year], 57 y.o.   MRN: [id number] Patient's wound evaluated yesterday with hydrotherapy.  Please see yesterday's note for description of wound etc.  This wound is cleaning up well with santyl, hydrotherapy, and dressing changes.  We would recommend continued conservative measurements for wound clean up and healing.  No surgical intervention is warranted at this time.  Cont preventative measures to prevent further wound breakdown, such as good protein intake, air overlay mattress, frequent turning, etc.  We will sign off at this time.  Henreitta Cea 7:48 AM 07/02/2017

## 2017-07-02 NOTE — Progress Notes (Signed)
R TL PICC with alteplase instilled earlier. Still no blood return. Sluggish flush to grey and white ports. Noted that PICC is now retracted 2.5 cm from original placement at 0 cm. Suspect PICC line is retracted to place where is is no longer in SVC or that there is a kink in the line. Primary RN notified to contact MD for order to  Exchange/replace PICC.

## 2017-07-02 NOTE — Progress Notes (Addendum)
Family Medicine Teaching Service Daily Progress Note Intern Pager: [phone]  Patient name: Timothy Le Medical record number: [id number] Date of birth: 06-29-1960 Age: 57 y.o. Gender: male  Primary Care Provider: Kerin Perna, NP Consultants: PCCM, IR, Surgery Code Status: full  Pt Overview and Major Events to Date:  5/22 admitted to fpts-> subsequent decompensation, xfer to pccm 5/23 intubated 5/30 Tracheostomy 6/4 bedside sacral wound debridement  Assessment and Plan: Respiratory failure with hypercarbia  Obesity hypoventilation syndrome Tolerating daytime trach collar. Still requiring nighttime vent. CCM following and managing vent, appreciate their recs. NPO 2/2 trach vent at night. Plan to go to OR when able for peg tube placement. Continuing torsemide 10mg  daily. - vital signs per stepdown routine - continue torsemide 10mg  daily - prn metolazone if develops s/s volume overload - NPO - albuterol 2.5mg  q 2 hours prn - pepcid 20mg  bid for gi ppx - prostate high sugar 39mL bid, vital high protein 74mL/hr when not npo  Sacral wound S/P debridement by surgery on 6/4. Surgery and Wound care following. Beefy red with some black eschar noted. Receiving hydrotherapy and santyl. Continue ceftriaxone for 7 day course for Ecoli in wound. - follow up surgery recs - Follow up wound care recs - hydrotherapy and santyl - continue ceftriaxone (6/8 - )  Tracheostomy Trach change on 6/12 by ENT. Signed off. Trach care per routine. Will try and wean off vent, and move to trach collar when able. - Likely trach size change around 6/26 - trach care per routine  Anemia Hemoglobin up to 10.3 from 9.9. Lovenox for dvt ppx. Likely due to a combination of blood loss from sacral wound, trach, and anemia of chronic disease. Transfuse if hgb falls below 7.  FEN/GI: Vital high protein liquid 32mL/hr, sugar free pro-state 56mL bid PPx: pepcid, lovenox  Disposition: unclear  Subjective:   Doing well this morning. Asking if he can drink water and eat candy.  Objective: Temp:  [98.1 F (36.7 C)-99.3 F (37.4 C)] 99.1 F (37.3 C) (06/14 1156) Pulse Rate:  [83-119] 105 (06/14 1400) Resp:  [14-38] 14 (06/14 1400) BP: (96-149)/(51-94) 113/76 (06/14 1400) SpO2:  [90 %-99 %] 98 % (06/14 1400) FiO2 (%):  [35 %-40 %] 35 % (06/14 1200) Weight:  [330 lb 0.5 oz (149.7 kg)] 330 lb 0.5 oz (149.7 kg) (06/14 0500) Physical Exam: General: morbidly obese, African American male. Resting comfortably in bed. Trach collar in place Cardiovascular: rrr , no m/r/g. 1+ edema BLE. Respiratory: improving rhonchi in all lungs fields. Trach collar this am Abdomen: soft, non-tender, non-distended.  Extremities: 5/5 strength BUE, BLE.   Laboratory: Recent Labs  Lab 06/30/17 0421 07/01/17 0502 07/02/17 0755  WBC 10.4 10.6* 8.3  HGB 9.6* 9.9* 10.3*  HCT 33.7* 35.2* 36.1*  PLT 321 327 337   Recent Labs  Lab 06/30/17 0421 07/01/17 0502 07/02/17 0755  NA 137 139 139  K 3.4* 3.6 3.4*  CL 89* 90* 93*  CO2 39* 37* 34*  BUN 45* 52* 52*  CREATININE 1.13 1.22 1.21  CALCIUM 9.2 9.4 9.6  GLUCOSE 126* 131* 138*    Imaging/Diagnostic Tests: CLINICAL DATA:  57 year old male with a history of dysphagia. Referred for gastrostomy  EXAM: CT ABDOMEN WITHOUT CONTRAST  TECHNIQUE: Multidetector CT imaging of the abdomen was performed following the standard protocol without IV contrast.  COMPARISON:  None.  FINDINGS: Lower chest: Atelectasis/scarring at the bilateral lung bases with no significant consolidation. No pleural effusion.  Hepatobiliary: Unremarkable liver. Hyperdense  material layered in the dependent gallbladder, potentially sludge/microlithiasis. No inflammatory changes.  Pancreas: Unremarkable pancreas  Spleen: Unremarkable spleen  Adrenals/Urinary Tract: Unremarkable adrenal glands.  Unremarkable appearance the bilateral kidneys with no hydronephrosis or  nephrolithiasis.  Stomach/Bowel: Post pyloric enteric feeding tube terminates within the first portion the duodenum. Stomach is relatively decompressed.  Presence of diverticula within the colon. No inflammatory changes of the colon or visualized small bowel.  Vascular/Lymphatic: No significant atherosclerotic changes. No lymphadenopathy.  Other: No significant body wall edema. No ventral wall hernia. Laxity of the anterior abdominal wall at the inferior aspects of the study with no inflammation or involvement of the bowel.  Musculoskeletal: No acute displaced fracture. Degenerative changes of the spine with flowing syndesmophytes.  IMPRESSION: No acute CT finding of the abdomen.  Likely biliary sludge/microlithiasis without evidence of acute inflammation.  Post pyloric tube terminates within the first portion the duodenum.  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  Atelectasis/scarring at the lung bases.  Guadalupe Dawn, MD 07/02/2017, 2:25 PM PGY-1, [place] Intern pager: [phone], text pages welcome

## 2017-07-03 LAB — GLUCOSE, CAPILLARY
GLUCOSE-CAPILLARY: 131 mg/dL — AB (ref 65–99)
GLUCOSE-CAPILLARY: 115 mg/dL — AB (ref 65–99)
GLUCOSE-CAPILLARY: 139 mg/dL — AB (ref 65–99)
GLUCOSE-CAPILLARY: 107 mg/dL — AB (ref 65–99)
Glucose-Capillary: 128 mg/dL — ABNORMAL HIGH (ref 65–99)

## 2017-07-03 LAB — CBC WITH DIFFERENTIAL/PLATELET
ABS IMMATURE GRANULOCYTES: 0.1 10*3/uL (ref 0.0–0.1)
BASOS ABS: 0.1 10*3/uL (ref 0.0–0.1)
Basophils Relative: 1 %
EOS PCT: 3 %
Eosinophils Absolute: 0.3 10*3/uL (ref 0.0–0.7)
HCT: 34.9 % — ABNORMAL LOW (ref 39.0–52.0)
HEMOGLOBIN: 9.9 g/dL — AB (ref 13.0–17.0)
Immature Granulocytes: 1 %
LYMPHS PCT: 25 %
Lymphs Abs: 2.4 10*3/uL (ref 0.7–4.0)
MCH: 25.6 pg — ABNORMAL LOW (ref 26.0–34.0)
MCHC: 28.4 g/dL — AB (ref 30.0–36.0)
MCV: 90.2 fL (ref 78.0–100.0)
Monocytes Absolute: 0.9 10*3/uL (ref 0.1–1.0)
Monocytes Relative: 9 %
NEUTROS ABS: 6.1 10*3/uL (ref 1.7–7.7)
Neutrophils Relative %: 61 %
Platelets: 315 10*3/uL (ref 150–400)
RBC: 3.87 MIL/uL — AB (ref 4.22–5.81)
RDW: 17.7 % — ABNORMAL HIGH (ref 11.5–15.5)
WBC: 9.7 10*3/uL (ref 4.0–10.5)

## 2017-07-03 LAB — BASIC METABOLIC PANEL
Anion gap: 12 (ref 5–15)
BUN: 55 mg/dL — ABNORMAL HIGH (ref 6–20)
CHLORIDE: 93 mmol/L — AB (ref 101–111)
CO2: 35 mmol/L — ABNORMAL HIGH (ref 22–32)
Calcium: 9.5 mg/dL (ref 8.9–10.3)
Creatinine, Ser: 1.26 mg/dL — ABNORMAL HIGH (ref 0.61–1.24)
GFR calc non Af Amer: >60 mL/min (ref >60)
Glucose, Bld: 149 mg/dL — ABNORMAL HIGH (ref 65–99)
Potassium: 3.4 mmol/L — ABNORMAL LOW (ref 3.5–5.1)
SODIUM: 140 mmol/L (ref 135–145)

## 2017-07-03 MED ORDER — CLONAZEPAM 0.5 MG PO TABS
0.2500 mg | ORAL_TABLET | Freq: Two times a day (BID) | ORAL | Status: DC
  Administered 2017-07-03 – 2017-07-06 (×6): 0.25 mg
  Filled 2017-07-03 (×6): qty 1

## 2017-07-03 MED ORDER — QUETIAPINE FUMARATE 25 MG PO TABS
50.0000 mg | ORAL_TABLET | Freq: Every day | ORAL | Status: DC
  Administered 2017-07-04 – 2017-07-07 (×4): 50 mg
  Filled 2017-07-03 (×4): qty 1

## 2017-07-03 MED ORDER — CHLORHEXIDINE GLUCONATE 0.12% ORAL RINSE (MEDLINE KIT)
15.0000 mL | Freq: Two times a day (BID) | OROMUCOSAL | Status: DC
  Administered 2017-07-03 – 2017-07-05 (×4): 15 mL via OROMUCOSAL

## 2017-07-03 MED ORDER — POTASSIUM CHLORIDE 20 MEQ/15ML (10%) PO SOLN
10.0000 meq | Freq: Every day | ORAL | Status: DC
  Administered 2017-07-03: 10 meq
  Filled 2017-07-03: qty 15

## 2017-07-03 MED ORDER — QUETIAPINE FUMARATE 50 MG PO TABS: 50.0000 mg | ORAL_TABLET | Freq: Every morning | ORAL | Status: DC

## 2017-07-03 MED ORDER — QUETIAPINE FUMARATE 100 MG PO TABS
100.0000 mg | ORAL_TABLET | Freq: Every day | ORAL | Status: DC
Start: 2017-07-03 — End: 2017-07-06
  Administered 2017-07-03 – 2017-07-05 (×3): 100 mg
  Filled 2017-07-03 (×3): qty 1

## 2017-07-03 MED ORDER — ORAL CARE MOUTH RINSE
15.0000 mL | OROMUCOSAL | Status: DC
  Administered 2017-07-03 – 2017-07-06 (×13): 15 mL via OROMUCOSAL

## 2017-07-03 NOTE — Progress Notes (Signed)
Family Medicine Teaching Service Daily Progress Note Intern Pager: [phone]  Patient name: ADE STMARIE Medical record number: [id number] Date of birth: [birth year] Age: 57 y.o. Gender: male  Primary Care Provider: Kerin Perna, NP Consultants: PCCM, IR, Surgery Code Status: full  Pt Overview and Major Events to Date:  5/22 admitted to fpts-> subsequent decompensation, xfer to pccm 5/23 intubated 5/30 Tracheostomy 6/4 bedside sacral wound debridement in OR  Assessment and Plan: Respiratory failure with hypercarbia  Obesity hypoventilation syndrome Tolerating daytime trach collar. Still requiring nighttime vent. CCM following and managing vent, appreciate their recs. NPO 2/2 trach vent at night. Plan to go to OR 6/17 for peg tube placement. - vital signs per stepdown routine - continue torsemide 10mg  daily - prn metolazone if develops s/s volume overload - albuterol 2.5mg  q 2 hours prn - pepcid 20mg  bid for gi ppx - prostate high sugar 12mL bid, vital high protein 79mL/hr when not npo -clonazepam 0.5 mg BID per tube on since ICU stay for sedation- goal to wean this off  Sacral wound S/P debridement by surgery on 6/4. Surgery and Wound care following. Beefy red with some black eschar noted. Receiving hydrotherapy and santyl. S/p ceftriaxone for 7 day course for Ecoli in wound. - follow up surgery recs - Follow up wound care recs - hydrotherapy and santyl  Tracheostomy- Trach change on 6/12 by ENT. Signed off. Trach care per routine. Will try and wean off vent, and move to trach collar when able. - CCM following for trach care & vent needs - Likely trach size change around 6/26  Anemia- stable Likely due to a combination of blood loss from sacral wound, trach, and anemia of chronic disease.  -Transfuse if hgb falls below 7.  FEN/GI: Vital high protein liquid 19mL/hr, sugar free pro-state 40mL bid PPx: pepcid, lovenox  Disposition: pending peg tube placement,  trach/vent needs  Subjective:  Shakes head "no" to if he is in any pain. No family at bedside.   Objective: Temp:  [98.9 F (37.2 C)-99.1 F (37.3 C)] 98.9 F (37.2 C) (06/15 0400) Pulse Rate:  [90-119] 96 (06/15 0700) Resp:  [14-38] 24 (06/15 0700) BP: (93-149)/(51-90) 116/84 (06/15 0700) SpO2:  [87 %-100 %] 94 % (06/15 0700) FiO2 (%):  [35 %-40 %] 40 % (06/15 0400) Weight:  [330 lb 7.5 oz (149.9 kg)] 330 lb 7.5 oz (149.9 kg) (06/15 0500) Physical Exam:  Gen: morbidly obese, laying in bed, trach in place. NAD. Heart: RRR no MRG Lungs: Exam limited by body habitus. Clear in anterior lung fields. Normal work of breathing.  Abdomen: obese abdomen. Soft. Non-distended. Extremities: no edema or cyanosis Neuro: tracks, follows commands, moves all extremities   Laboratory: Recent Labs  Lab 07/01/17 0502 07/02/17 0755 07/03/17 0536  WBC 10.6* 8.3 9.7  HGB 9.9* 10.3* 9.9*  HCT 35.2* 36.1* 34.9*  PLT 327 337 315   Recent Labs  Lab 07/01/17 0502 07/02/17 0755 07/03/17 0536  NA 139 139 140  K 3.6 3.4* 3.4*  CL 90* 93* 93*  CO2 37* 34* 35*  BUN 52* 52* 55*  CREATININE 1.22 1.21 1.26*  CALCIUM 9.4 9.6 9.5  GLUCOSE 131* 138* 149*    Imaging/Diagnostic Tests:  CT ABDOMEN WITHOUT CONTRAST IMPRESSION: No acute CT finding of the abdomen. Likely biliary sludge/microlithiasis without evidence of acute inflammation. Post pyloric tube terminates within the first portion the duodenum. 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 Atelectasis/scarring at the lung bases.  Steve Rattler, DO  07/03/2017, 8:21 AM PGY-2, [place] Intern pager: [phone], text pages welcome

## 2017-07-03 NOTE — Progress Notes (Signed)
Physical Therapy Wound Treatment Patient Details  Name: Timothy Le MRN: [id number] Date of Birth: [birth year]  Today's Date: 07/03/2017 Time: 3810-1751 Time Calculation (min): 55 min  Subjective  Subjective: Pt sleepy but able to be aroused for session; on vent.  Patient and Family Stated Goals: None stated Prior Treatments: surgical debridement 6/4  Pain Score: Pt was premedicated and tolerated well.  Wound Assessment  Pressure Injury Stage II -  Partial thickness loss of dermis presenting as a shallow open ulcer with a red, pink wound bed without slough. 2 CM x 2CM excoriated  (Active)  Wound Image   06/30/2017 12:19 PM  Dressing Type ABD;Barrier Film (skin prep);Gauze (Comment);Paper tape;Moist to dry 07/03/2017  9:00 AM  Dressing Changed;Clean;Dry;Intact 07/03/2017  9:00 AM  Dressing Change Frequency Daily 07/03/2017  9:00 AM  State of Healing Early/partial granulation 07/03/2017  9:00 AM  Site / Wound Assessment Red;Yellow;Brown 07/03/2017  9:00 AM  % Wound base Red or Granulating 80% 07/03/2017  9:00 AM  % Wound base Yellow/Fibrinous Exudate 15% 07/03/2017  9:00 AM  % Wound base Black/Eschar 5% 07/03/2017  9:00 AM  % Wound base Other/Granulation Tissue (Comment) 0% 07/03/2017  9:00 AM  Peri-wound Assessment Intact 07/03/2017  9:00 AM  Wound Length (cm) 8.1 cm 06/30/2017 11:00 AM  Wound Width (cm) 8.2 cm 06/30/2017 11:00 AM  Wound Depth (cm) 2.5 cm 06/30/2017 11:00 AM  Wound Surface Area (cm^2) 66.42 cm^2 06/30/2017 11:00 AM  Wound Volume (cm^3) 166.05 cm^3 06/30/2017 11:00 AM  Tunneling (cm) 0 06/23/2017 11:20 AM  Undermining (cm) 0 06/23/2017 11:20 AM  Margins Unattached edges (unapproximated) 07/03/2017  9:00 AM  Drainage Amount Moderate 07/03/2017  9:00 AM  Drainage Description Odor;Purulent 07/03/2017  9:00 AM  Treatment Debridement (Selective);Hydrotherapy (Pulse lavage);Packing (Saline gauze) 07/03/2017  9:00 AM   Santyl applied to wound bed prior to applying dressing.      Hydrotherapy Pulsed lavage therapy - wound location: central buttocks Pulsed Lavage with Suction (psi): 8 psi(4 at times due to pain) Pulsed Lavage with Suction - Normal Saline Used: 1000 mL Pulsed Lavage Tip: Tip with splash shield Selective Debridement Selective Debridement - Location: central buttocks Selective Debridement - Tools Used: Forceps;Scalpel;Scissors Selective Debridement - Tissue Removed: yellow and brown necrotic tissue   Wound Assessment and Plan  Wound Therapy - Assess/Plan/Recommendations Wound Therapy - Clinical Statement: Wound base near sacrum continues to be the most tender - some tissue there may be healthy fibrous tissue. This patient will continue to benefit from hydrotherapy to decrease bioburden and promote wound bed healing.  Wound Therapy - Functional Problem List: Acute pain, decreased tolerance for position changes Factors Delaying/Impairing Wound Healing: Diabetes Mellitus;Incontinence;Immobility;Multiple medical problems;Polypharmacy Hydrotherapy Plan: Debridement;Dressing change;Pulsatile lavage with suction;Patient/family education Wound Therapy - Frequency: 6X / week Wound Therapy - Follow Up Recommendations: Skilled nursing facility Wound Plan: See above  Wound Therapy Goals- Improve the function of patient's integumentary system by progressing the wound(s) through the phases of wound healing (inflammation - proliferation - remodeling) by: Decrease Necrotic Tissue to: 0 Decrease Necrotic Tissue - Progress: Updated due to goal met Increase Granulation Tissue to: 100% Increase Granulation Tissue - Progress: Updated due to goal met Goals/treatment plan/discharge plan were made with and agreed upon by patient/family: Yes Time For Goal Achievement: 7 days Wound Therapy - Potential for Goals: Good  Goals will be updated until maximal potential achieved or discharge criteria met.  Discharge criteria: when goals achieved, discharge from hospital, MD  decision/surgical intervention, no progress towards  goals, refusal/missing three consecutive treatments without notification or medical reason.  GP     Thelma Comp 07/03/2017, 11:48 AM   Rolinda Roan, PT, DPT Acute Rehabilitation Services Pager: [phone]

## 2017-07-04 LAB — GLUCOSE, CAPILLARY
GLUCOSE-CAPILLARY: 115 mg/dL — AB (ref 65–99)
GLUCOSE-CAPILLARY: 129 mg/dL — AB (ref 65–99)
GLUCOSE-CAPILLARY: 139 mg/dL — AB (ref 65–99)
GLUCOSE-CAPILLARY: 142 mg/dL — AB (ref 65–99)
Glucose-Capillary: 143 mg/dL — ABNORMAL HIGH (ref 65–99)
Glucose-Capillary: 133 mg/dL — ABNORMAL HIGH (ref 65–99)

## 2017-07-04 LAB — BASIC METABOLIC PANEL
Anion gap: 9 (ref 5–15)
BUN: 50 mg/dL — AB (ref 6–20)
CALCIUM: 9.4 mg/dL (ref 8.9–10.3)
CHLORIDE: 97 mmol/L — AB (ref 101–111)
CO2: 36 mmol/L — ABNORMAL HIGH (ref 22–32)
CREATININE: 1.08 mg/dL (ref 0.61–1.24)
GFR calc non Af Amer: >60 mL/min (ref >60)
Glucose, Bld: 148 mg/dL — ABNORMAL HIGH (ref 65–99)
Potassium: 3.4 mmol/L — ABNORMAL LOW (ref 3.5–5.1)
SODIUM: 142 mmol/L (ref 135–145)

## 2017-07-04 LAB — CBC
HCT: 35 % — ABNORMAL LOW (ref 39.0–52.0)
Hemoglobin: 9.8 g/dL — ABNORMAL LOW (ref 13.0–17.0)
MCH: 25.3 pg — AB (ref 26.0–34.0)
MCHC: 28 g/dL — AB (ref 30.0–36.0)
MCV: 90.4 fL (ref 78.0–100.0)
PLATELETS: 316 10*3/uL (ref 150–400)
RBC: 3.87 MIL/uL — ABNORMAL LOW (ref 4.22–5.81)
RDW: 17.7 % — AB (ref 11.5–15.5)
WBC: 8.8 10*3/uL (ref 4.0–10.5)

## 2017-07-04 MED ORDER — NYSTATIN 100000 UNIT/ML MT SUSP
5.0000 mL | Freq: Four times a day (QID) | OROMUCOSAL | Status: DC
  Administered 2017-07-04 – 2017-07-15 (×42): 500000 [IU] via ORAL
  Filled 2017-07-04 (×46): qty 5

## 2017-07-04 MED ORDER — POTASSIUM CHLORIDE 20 MEQ/15ML (10%) PO SOLN
10.0000 meq | Freq: Two times a day (BID) | ORAL | Status: AC
  Administered 2017-07-04 (×2): 10 meq
  Filled 2017-07-04 (×2): qty 15

## 2017-07-04 MED ORDER — POTASSIUM CHLORIDE 20 MEQ/15ML (10%) PO SOLN
10.0000 meq | Freq: Every day | ORAL | Status: DC
  Administered 2017-07-05 – 2017-07-15 (×11): 10 meq via ORAL
  Filled 2017-07-04 (×11): qty 15

## 2017-07-04 NOTE — Progress Notes (Signed)
Family Medicine Teaching Service Daily Progress Note Intern Pager: [phone]  Patient name: Timothy Le Medical record number: [id number] Date of birth: 14-Nov-1960 Age: 57 y.o. Gender: male  Primary Care Provider: Kerin Perna, NP Consultants: PCCM, IR, Surgery, WOC Code Status: full  Pt Overview and Major Events to Date:  5/22 admitted to fpts-> subsequent decompensation, xfer to pccm 5/23 intubated 5/30 Tracheostomy 6/4 bedside sacral wound debridement in OR  Assessment and Plan:  Respiratory failure with hypercarbia  Obesity hypoventilation syndrome Tolerating daytime trach collar. Still requiring nighttime vent. CCM following and managing vent, appreciate their recs. NPO 2/2 trach vent at night.  - Plan to go to OR 6/17 for peg tube placement, NPO at MN, lovenox held for 6/16 - vital signs per stepdown routine - continue torsemide 10mg  daily - prn metolazone if develops s/s volume overload - albuterol 2.5mg  q 2 hours prn - pepcid 20mg  bid for gi ppx - prostate high sugar 42mL bid, vital high protein 34mL/hr when not npo -reduced clonazepam to 0.25 mg BID per tube on since ICU stay for sedation- goal to wean this off -reduced seroquel to 50 mg every morning, continue 100 mg every night and wean as able  Sacral wound S/P debridement by surgery on 6/4. Surgery and Wound care following. Beefy red with some black eschar noted. Receiving hydrotherapy and santyl. S/p ceftriaxone for 7 day course for Ecoli in wound. - follow up surgery recs - Follow up wound care recs - hydrotherapy and santyl - fentanyl 25-100 mcg q2prn for pain from wound/with wound care  Tracheostomy- Trach change on 6/12 by ENT - Trach care per routine.  - wean vent as able, t-collar during day  - CCM following for trach care & vent needs - Likely trach size change around 6/26  Anemia- stable Likely due to a combination of blood loss from sacral wound, trach, and anemia of chronic disease.   -Transfuse if hgb falls below 7.  FEN/GI: Vital high protein liquid 72mL/hr, sugar free pro-state 66mL bid PPx: pepcid, lovenox  Disposition: pending peg tube placement, trach/vent needs  Subjective:  Cannot communicate past shaking head yes or no, says yes to doing well, says no to pain or any needs.  Objective: Temp:  [98.6 F (37 C)-98.9 F (37.2 C)] 98.9 F (37.2 C) (06/16 0413) Pulse Rate:  [89-109] 98 (06/16 0600) Resp:  [15-34] 31 (06/16 0600) BP: (98-137)/(55-91) 123/87 (06/16 0600) SpO2:  [88 %-100 %] 88 % (06/16 0600) FiO2 (%):  [40 %] 40 % (06/16 0359) Weight:  [330 lb 14.6 oz (150.1 kg)] 330 lb 14.6 oz (150.1 kg) (06/16 0416) Physical Exam:  Gen: morbidly obese, laying in bed, trach in place. NAD. Heart: RRR no MRG Lungs: Exam limited by body habitus. Clear in anterior lung fields. Normal work of breathing.  Abdomen: obese abdomen. Soft. Non-distended. Extremities: no edema or cyanosis Neuro: tracks, follows commands, moves all extremities   Laboratory: Recent Labs  Lab 07/02/17 0755 07/03/17 0536 07/04/17 0408  WBC 8.3 9.7 8.8  HGB 10.3* 9.9* 9.8*  HCT 36.1* 34.9* 35.0*  PLT 337 315 316   Recent Labs  Lab 07/02/17 0755 07/03/17 0536 07/04/17 0408  NA 139 140 142  K 3.4* 3.4* 3.4*  CL 93* 93* 97*  CO2 34* 35* 36*  BUN 52* 55* 50*  CREATININE 1.21 1.26* 1.08  CALCIUM 9.6 9.5 9.4  GLUCOSE 138* 149* 148*    Imaging/Diagnostic Tests:  CT ABDOMEN WITHOUT CONTRAST IMPRESSION: No acute  CT finding of the abdomen. Likely biliary sludge/microlithiasis without evidence of acute inflammation. Post pyloric tube terminates within the first portion the duodenum. 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 Atelectasis/scarring at the lung bases.  Steve Rattler, DO 07/04/2017, 7:19 AM PGY-2, [place] Intern pager: [phone], text pages welcome

## 2017-07-05 ENCOUNTER — Inpatient Hospital Stay (HOSPITAL_COMMUNITY): Payer: Medicaid Other

## 2017-07-05 ENCOUNTER — Encounter (HOSPITAL_COMMUNITY): Payer: Self-pay | Admitting: Interventional Radiology

## 2017-07-05 DIAGNOSIS — J9611 Chronic respiratory failure with hypoxia: Secondary | ICD-10-CM

## 2017-07-05 DIAGNOSIS — J9612 Chronic respiratory failure with hypercapnia: Secondary | ICD-10-CM

## 2017-07-05 HISTORY — PX: IR GASTROSTOMY TUBE MOD SED: IMG625

## 2017-07-05 LAB — BASIC METABOLIC PANEL
Anion gap: 11 (ref 5–15)
BUN: 42 mg/dL — ABNORMAL HIGH (ref 6–20)
CALCIUM: 9.7 mg/dL (ref 8.9–10.3)
CO2: 36 mmol/L — AB (ref 22–32)
CREATININE: 1.15 mg/dL (ref 0.61–1.24)
Chloride: 98 mmol/L — ABNORMAL LOW (ref 101–111)
GFR calc Af Amer: >60 mL/min (ref >60)
GFR calc non Af Amer: >60 mL/min (ref >60)
GLUCOSE: 124 mg/dL — AB (ref 65–99)
Potassium: 3.7 mmol/L (ref 3.5–5.1)
Sodium: 145 mmol/L (ref 135–145)

## 2017-07-05 LAB — CBC
HEMATOCRIT: 35.6 % — AB (ref 39.0–52.0)
Hemoglobin: 9.9 g/dL — ABNORMAL LOW (ref 13.0–17.0)
MCH: 25.4 pg — ABNORMAL LOW (ref 26.0–34.0)
MCHC: 27.8 g/dL — AB (ref 30.0–36.0)
MCV: 91.3 fL (ref 78.0–100.0)
Platelets: 321 10*3/uL (ref 150–400)
RBC: 3.9 MIL/uL — ABNORMAL LOW (ref 4.22–5.81)
RDW: 17.7 % — AB (ref 11.5–15.5)
WBC: 8.3 10*3/uL (ref 4.0–10.5)

## 2017-07-05 LAB — APTT: APTT: 33 s (ref 24–36)

## 2017-07-05 LAB — GLUCOSE, CAPILLARY
GLUCOSE-CAPILLARY: 112 mg/dL — AB (ref 65–99)
GLUCOSE-CAPILLARY: 122 mg/dL — AB (ref 65–99)
Glucose-Capillary: 103 mg/dL — ABNORMAL HIGH (ref 65–99)
Glucose-Capillary: 111 mg/dL — ABNORMAL HIGH (ref 65–99)
Glucose-Capillary: 129 mg/dL — ABNORMAL HIGH (ref 65–99)
Glucose-Capillary: 142 mg/dL — ABNORMAL HIGH (ref 65–99)
Glucose-Capillary: 93 mg/dL (ref 65–99)

## 2017-07-05 LAB — PROTIME-INR
INR: 1.24
PROTHROMBIN TIME: 15.5 s — AB (ref 11.4–15.2)

## 2017-07-05 MED ORDER — GLUCAGON HCL (RDNA) 1 MG IJ SOLR
INTRAMUSCULAR | Status: AC | PRN
  Administered 2017-07-05: 1 mg via INTRAVENOUS

## 2017-07-05 MED ORDER — CEFAZOLIN SODIUM-DEXTROSE 2-4 GM/100ML-% IV SOLN
INTRAVENOUS | Status: AC
  Filled 2017-07-05: qty 100

## 2017-07-05 MED ORDER — FENTANYL CITRATE (PF) 100 MCG/2ML IJ SOLN
INTRAMUSCULAR | Status: AC
  Filled 2017-07-05: qty 4

## 2017-07-05 MED ORDER — IOPAMIDOL (ISOVUE-300) INJECTION 61%
INTRAVENOUS | Status: AC
  Administered 2017-07-05: 20 mL
  Filled 2017-07-05: qty 50

## 2017-07-05 MED ORDER — CEFAZOLIN SODIUM-DEXTROSE 2-4 GM/100ML-% IV SOLN
2.0000 g | Freq: Once | INTRAVENOUS | Status: AC
  Administered 2017-07-05: 2 g via INTRAVENOUS
  Filled 2017-07-05: qty 100

## 2017-07-05 MED ORDER — ENOXAPARIN SODIUM 80 MG/0.8ML ~~LOC~~ SOLN
0.5000 mg/kg | SUBCUTANEOUS | Status: DC
  Administered 2017-07-05 – 2017-07-14 (×10): 75 mg via SUBCUTANEOUS
  Filled 2017-07-05: qty 0.8
  Filled 2017-07-05 (×2): qty 0.75
  Filled 2017-07-05 (×2): qty 0.8
  Filled 2017-07-05: qty 0.75
  Filled 2017-07-05 (×5): qty 0.8

## 2017-07-05 MED ORDER — ENOXAPARIN SODIUM 80 MG/0.8ML ~~LOC~~ SOLN
0.5000 mg/kg | SUBCUTANEOUS | Status: DC
  Filled 2017-07-05: qty 0.75

## 2017-07-05 MED ORDER — LIDOCAINE HCL (PF) 1 % IJ SOLN
INTRAMUSCULAR | Status: AC | PRN
  Administered 2017-07-05: 10 mL

## 2017-07-05 MED ORDER — ADULT MULTIVITAMIN LIQUID CH
15.0000 mL | Freq: Every day | ORAL | Status: DC
  Administered 2017-07-05 – 2017-07-15 (×12): 15 mL
  Filled 2017-07-05 (×11): qty 15

## 2017-07-05 MED ORDER — MIDAZOLAM HCL 2 MG/2ML IJ SOLN
INTRAMUSCULAR | Status: AC
  Filled 2017-07-05: qty 4

## 2017-07-05 MED ORDER — ENOXAPARIN SODIUM 80 MG/0.8ML ~~LOC~~ SOLN: 0.5000 mg/kg | SUBCUTANEOUS | Status: DC

## 2017-07-05 MED ORDER — MIDAZOLAM HCL 2 MG/2ML IJ SOLN
INTRAMUSCULAR | Status: AC | PRN
  Administered 2017-07-05: 1 mg via INTRAVENOUS
  Administered 2017-07-05: 0.5 mg via INTRAVENOUS

## 2017-07-05 MED ORDER — KETOROLAC TROMETHAMINE 30 MG/ML IJ SOLN
15.0000 mg | Freq: Once | INTRAMUSCULAR | Status: AC
  Administered 2017-07-05: 15 mg via INTRAVENOUS
  Filled 2017-07-05: qty 1

## 2017-07-05 MED ORDER — GLUCAGON HCL RDNA (DIAGNOSTIC) 1 MG IJ SOLR
INTRAMUSCULAR | Status: AC
  Filled 2017-07-05: qty 1

## 2017-07-05 MED ORDER — FENTANYL CITRATE (PF) 100 MCG/2ML IJ SOLN
INTRAMUSCULAR | Status: AC | PRN
  Administered 2017-07-05: 25 ug via INTRAVENOUS
  Administered 2017-07-05: 50 ug via INTRAVENOUS

## 2017-07-05 MED ORDER — LIDOCAINE HCL 1 % IJ SOLN
INTRAMUSCULAR | Status: AC
  Filled 2017-07-05: qty 20

## 2017-07-05 NOTE — Evaluation (Addendum)
Clinical/Bedside Swallow Evaluation Patient Details  Name: Timothy Le MRN: [id number] Date of Birth: [birth year]  Today's Date: 07/05/2017 Time: SLP Start Time (ACUTE ONLY): 97 SLP Stop Time (ACUTE ONLY): 1616 SLP Time Calculation (min) (ACUTE ONLY): 13 min  Past Medical History:  Past Medical History:  Diagnosis Date  . Asthma   . COPD (chronic obstructive pulmonary disease) ([place])   . Diabetes mellitus without complication ([place])   . Hypertension   . Shortness of breath dyspnea   . Sleep apnea    Past Surgical History:  Past Surgical History:  Procedure Laterality Date  . IR GASTROSTOMY TUBE MOD SED  07/05/2017  . TRACHEOSTOMY TUBE PLACEMENT N/A 06/17/2017   Procedure: TRACHEOSTOMY;  Surgeon: Timothy Baptist, MD;  Location: [facility] OR;  Service: ENT;  Laterality: N/A;   HPI:  57 yo male admitted with respiratory distress. Intubated 5/23, trach 5/30 . PMH includes morbid obesity, COPD, asthma, OSA, HTN, DM. PEG placed 6/17 (same day as swallow assessment ordered).   Assessment / Plan / Recommendation Clinical Impression  Observed over trials ice chips, tsp/cup sip water and applesauce with PMV donned following oral decontamination s/p NPO for approximately 26 days. Oral transit adequate and any s/s of aspiration were absent. HR and SpO2 stable and RR jumping to low 40's (briefly) at end of evaluation. Clear vocal quality during phonation. Recommend nursing continue high risk category oral care prior to allow limited ice chips, otherwise NPO and therapist will further observe in diagnostic treatment sessions re: appropriateness for objective swallow evaluation.  SLP Visit Diagnosis: Dysphagia, unspecified (R13.10)    Aspiration Risk  Moderate aspiration risk    Diet Recommendation NPO   Medication Administration: Via alternative means    Other  Recommendations Oral Care Recommendations: Oral care QID   Follow up Recommendations Other (comment)(TBD)      Frequency and Duration min  2x/week  2 weeks       Prognosis Prognosis for Safe Diet Advancement: Good      Swallow Study   General HPI: 57 yo male admitted with respiratory distress. Intubated 5/23, trach 5/30 . PMH includes morbid obesity, COPD, asthma, OSA, HTN, DM. PEG placed 6/17 (same day as swallow assessment ordered). Type of Study: Bedside Swallow Evaluation Previous Swallow Assessment: none Diet Prior to this Study: NPO;PEG tube Temperature Spikes Noted: No Respiratory Status: Trach;Trach Collar Trach Size and Type: Deflated(#7 cuffed) History of Recent Intubation: Yes Length of Intubations (days): 7 days Date extubated: (trach 5/30) Behavior/Cognition: Alert;Cooperative;Pleasant mood Oral Cavity Assessment: Other (comment)(lingual candidias) Oral Care Completed by SLP: Yes Oral Cavity - Dentition: Edentulous(doesn't use dentures when eating) Vision: Functional for self-feeding Self-Feeding Abilities: Able to feed self Patient Positioning: Upright in bed Baseline Vocal Quality: Hoarse Volitional Cough: Strong Volitional Swallow: Able to elicit    Oral/Motor/Sensory Function Overall Oral Motor/Sensory Function: Within functional limits   Ice Chips Ice chips: Within functional limits   Thin Liquid Thin Liquid: Within functional limits    Nectar Thick Nectar Thick Liquid: Not tested   Honey Thick Honey Thick Liquid: Not tested   Puree Puree: Within functional limits   Solid   GO   Solid: Not tested        Timothy Le 07/05/2017,5:02 PM   Timothy Le.Ed [employer] [phone]

## 2017-07-05 NOTE — Progress Notes (Signed)
Physical Therapy Wound Treatment Patient Details  Name: Timothy Le MRN: [id number] Date of Birth: Jul 05, 1960  Today's Date: 07/05/2017 Time: 4665-9935 Time Calculation (min): 23 min  Subjective  Subjective: Pt awake and on trach collar. Patient and Family Stated Goals: None stated Prior Treatments: surgical debridement 6/4  Pain Score: Pain Score: (premedicate for wound care )  Wound Assessment  Pressure Injury Stage II -  Partial thickness loss of dermis presenting as a shallow open ulcer with a red, pink wound bed without slough. 2 CM x 2CM excoriated  (Active)  Dressing Type ABD;Barrier Film (skin prep);Gauze (Comment);Moist to dry 07/05/2017  5:00 PM  Dressing Changed;Clean;Dry;Intact 07/05/2017  5:00 PM  Dressing Change Frequency Daily 07/05/2017  5:00 PM  State of Healing Early/partial granulation 07/05/2017  5:00 PM  Site / Wound Assessment Red;Yellow 07/05/2017  5:00 PM  % Wound base Red or Granulating 90% 07/05/2017  5:00 PM  % Wound base Yellow/Fibrinous Exudate 10% 07/05/2017  5:00 PM  % Wound base Black/Eschar 0% 07/05/2017  5:00 PM  % Wound base Other/Granulation Tissue (Comment) 0% 07/05/2017  5:00 PM  Peri-wound Assessment Intact 07/05/2017  5:00 PM  Wound Length (cm) 8.1 cm 06/30/2017 11:00 AM  Wound Width (cm) 8.2 cm 06/30/2017 11:00 AM  Wound Depth (cm) 2.5 cm 06/30/2017 11:00 AM  Wound Surface Area (cm^2) 66.42 cm^2 06/30/2017 11:00 AM  Wound Volume (cm^3) 166.05 cm^3 06/30/2017 11:00 AM  Tunneling (cm) 0 06/23/2017 11:20 AM  Undermining (cm) 0 06/23/2017 11:20 AM  Margins Unattached edges (unapproximated) 07/05/2017  5:00 PM  Drainage Amount Moderate 07/05/2017  5:00 PM  Drainage Description Odor;Purulent 07/05/2017  5:00 PM  Treatment Debridement (Selective);Hydrotherapy (Pulse lavage);Packing (Saline gauze) 07/05/2017  5:00 PM      Hydrotherapy Pulsed lavage therapy - wound location: central buttocks Pulsed Lavage with Suction (psi): 8 psi(4 at times due to pain) Pulsed  Lavage with Suction - Normal Saline Used: 1000 mL Pulsed Lavage Tip: Tip with splash shield Selective Debridement Selective Debridement - Location: central buttocks Selective Debridement - Tools Used: Forceps;Scissors Selective Debridement - Tissue Removed: yellow slough   Wound Assessment and Plan  Wound Therapy - Assess/Plan/Recommendations Wound Therapy - Clinical Statement: Wound continues to improve with very little necrotic tissue remaining. Feel we are close to reaching maximum benefit from hydrotherapy. Wound Therapy - Functional Problem List: Acute pain, decreased tolerance for position changes Factors Delaying/Impairing Wound Healing: Diabetes Mellitus;Incontinence;Immobility;Multiple medical problems;Polypharmacy Hydrotherapy Plan: Debridement;Dressing change;Pulsatile lavage with suction;Patient/family education Wound Therapy - Frequency: 6X / week Wound Therapy - Follow Up Recommendations: Skilled nursing facility Wound Plan: See above  Wound Therapy Goals- Improve the function of patient's integumentary system by progressing the wound(s) through the phases of wound healing (inflammation - proliferation - remodeling) by: Decrease Necrotic Tissue to: 0 Decrease Necrotic Tissue - Progress: Progressing toward goal Increase Granulation Tissue to: 100% Increase Granulation Tissue - Progress: Progressing toward goal  Goals will be updated until maximal potential achieved or discharge criteria met.  Discharge criteria: when goals achieved, discharge from hospital, MD decision/surgical intervention, no progress towards goals, refusal/missing three consecutive treatments without notification or medical reason.  GP     [name] 07/05/2017, 5:48 PM [employer] PT [phone]

## 2017-07-05 NOTE — Progress Notes (Signed)
CSW continues to follow pt for further disposition needs. CSW aware that pt is on trach collar at 40 percent. CSW will continue to follow pt for appropriate placement.   Virgie Dad Cailyn Houdek, MSW, [place] [facility] Clinical Social Worker [phone]

## 2017-07-05 NOTE — Procedures (Signed)
20 Fr pull through G tube EBL 0 Comp 0 

## 2017-07-05 NOTE — Progress Notes (Signed)
PULMONARY / CRITICAL CARE MEDICINE   Name: Timothy Le MRN: [id number] DOB: August 19, 1960    ADMISSION DATE:  06/09/2017  CHIEF COMPLAINT:  Dyspnea  HISTORY OF PRESENT ILLNESS:   57 yo male with altered mental status from acute on chronic hypoxic/hypercapnic respiratory failure, CHF exacerbation.  Required intubation.  Failed to wean and required tracheostomy.  SUBJECTIVE:  Has been on ATC since 6/15.  Did not get qhs vent rest over weekend.  Tolerating well.    VITAL SIGNS: BP (!) 111/52   Pulse (!) 104   Temp 98.6 F (37 C) (Oral)   Resp 19   Ht 5\' 6"  (1.676 m)   Wt (!) 150.2 kg (331 lb 2.1 oz)   SpO2 96%   BMI 53.45 kg/m   VENTILATOR SETTINGS: FiO2 (%):  [35 %-40 %] 35 %  INTAKE / OUTPUT: I/O last 3 completed shifts: In: 1913 [I.V.:393; NG/GT:1520] Out: 2175 [Urine:2175]  PHYSICAL EXAMINATION:  General - obese, pleasant  Eyes - pupils reactive ENT - mm moist, no JVD, trach  Cardiac - regular, no murmur Chest - resps even non labored on ATC, distant BS Abd - soft, non tender Ext - 1+ edema Skin - sacral wound Neuro - awake, alert, appropriate, MAE   LABS:  BMET Recent Labs  Lab 07/03/17 0536 07/04/17 0408 07/05/17 0412  NA 140 142 145  K 3.4* 3.4* 3.7  CL 93* 97* 98*  CO2 35* 36* 36*  BUN 55* 50* 42*  CREATININE 1.26* 1.08 1.15  GLUCOSE 149* 148* 124*    Electrolytes Recent Labs  Lab 07/03/17 0536 07/04/17 0408 07/05/17 0412  CALCIUM 9.5 9.4 9.7    CBC Recent Labs  Lab 07/03/17 0536 07/04/17 0408 07/05/17 0412  WBC 9.7 8.8 8.3  HGB 9.9* 9.8* 9.9*  HCT 34.9* 35.0* 35.6*  PLT 315 316 321    Coag's Recent Labs  Lab 06/30/17 1152  INR 1.15    Sepsis Markers No results for input(s): LATICACIDVEN, PROCALCITON, O2SATVEN in the last 168 hours.  ABG No results for input(s): PHART, PCO2ART, PO2ART in the last 168 hours.  Liver Enzymes No results for input(s): AST, ALT, ALKPHOS, BILITOT, ALBUMIN in the last 168  hours.  Cardiac Enzymes No results for input(s): TROPONINI, PROBNP in the last 168 hours.  Glucose Recent Labs  Lab 07/04/17 1609 07/04/17 1915 07/05/17 0028 07/05/17 0411 07/05/17 0807 07/05/17 1119  GLUCAP 115* 142* 129* 111* 103* 142*    Imaging No results found.   STUDIES:  07/31/16 PFT > no airflow obstruction, severe restriction. 06/10/17 Echo> LVEF 55-60%, otherwise poor visualization  CULTURES: Sacral wound 6/04 >> E coli Sputum 6/04 >> Corynebacterium  ANTIBIOTICS: 5/22 levaquin > 5/24 6/08 Rocephin >  LINES/TUBES: 5/23 ETT > 5/30 5/28 RUE PICC >  5/30 tracheostomy >    ASSESSMENT / PLAN:  Acute on chronic hypoxic/hypercapnic respiratory failure with OSA/OHS, morbid obesity. Tracheostomy status. PLAN -  Plan from last week (and order in chart) was for PS or ATC daytime with qhs vent but has been on ATC x 48 hrs without any nighttime POS pressure.  Need to determine whether he needs qhs pos pressure long term - suspect he does  Wound care to trach site  PRN BD    HFpEF. PLAN  Neg fluid balance as able    Stage IV sacral wound. PLAN  Per primary team and CCS     Nickolas Madrid, NP 07/05/2017  11:51 AM Pager: [phone] or [phone]

## 2017-07-05 NOTE — Progress Notes (Addendum)
Family Medicine Teaching Service Daily Progress Note Intern Pager: [phone]  Patient name: Timothy Le Medical record number: [id number] Date of birth: 1960/06/10 Age: 57 y.o. Gender: male  Primary Care Provider: Kerin Perna, NP Consultants: PCCM, IR, Surgery, WOC Code Status: full  Pt Overview and Major Events to Date:  5/22 admitted to fpts-> subsequent decompensation, xfer to pccm 5/23 intubated 5/30 Tracheostomy 6/4 bedside sacral wound debridement in OR 6/17 PEG tube placement  Assessment and Plan:  Respiratory failure with hypercarbia  Obesity hypoventilation syndrome Now tolerating the trach collar well. Able to stay off vent overnight and for most of weekend. Given this improvement will now see how he does off of pressure support long term. If able to stay off this will improve our options for his dispo. Will certainly help him in terms of comfort by being able to get food and liquid as well. Now S/P PEG tube 6/17. Will give prn dose of toradol for pain for longer acting. Still has fentanyl IV on board as well. - Appreciate CCM recs for trach management - restart lovenox 6/18 - If able to tolerate trach collar could possibly let have liquids on 6/18 - vital signs per stepdown routine - torsemide 10mg  daily - prn metolazone for increased swelling - albuterol 2.5mg  q 2 hours prn - pepcid 20 mg bid - prostate high sugar 34mL bid, vital high protein 66mL/hr when not npo -reduced clonazepam to 0.25 mg BID per tube on since ICU stay for sedation- goal to wean this off -reduced seroquel to 50 mg every morning, continue 100 mg every night and wean as able  Sacral wound S/P debridement by surgery on 6/4. Surgery and Wound care following. Beefy red with some black eschar noted. Receiving hydrotherapy and santyl. - follow up surgery recs - Follow up wound care recs - hydrotherapy and santyl - fentanyl 25-100 mcg q2prn for pain from wound/with wound  care  Tracheostomy- Trach change on 6/12 by ENT - Trach care per routine.  - wean vent as able, t-collar during day  - CCM following for trach care & vent needs - Likely trach size change around 6/26  Anemia- stable Likely due to a combination of blood loss from sacral wound, trach, and anemia of chronic disease.  -Transfuse if hgb falls below 7.  FEN/GI: Vital high protein liquid 70mL/hr, sugar free pro-state 40mL bid PPx: pepcid, lovenox  Disposition: pending peg tube placement, trach/vent needs  Subjective:  Able to communicate by pointing at letters and spelling. Interested in when he might be able to drink and eat this am.  Objective: Temp:  [98.4 F (36.9 C)-99.1 F (37.3 C)] 98.6 F (37 C) (06/17 1122) Pulse Rate:  [81-110] 104 (06/17 1125) Resp:  [13-31] 19 (06/17 1125) BP: (107-141)/(52-98) 111/52 (06/17 1125) SpO2:  [90 %-100 %] 96 % (06/17 1125) FiO2 (%):  [35 %-40 %] 35 % (06/17 1125) Weight:  [331 lb 2.1 oz (150.2 kg)] 331 lb 2.1 oz (150.2 kg) (06/17 0500) Physical Exam: Gen: Morbindly obese AA male resting comfortably in bed, tolerating trach collar well Heart: Regular Rate Rhythm, no Murmurs/Rubs/Gallops Lungs: Exam limited by body habitus. Coarse rhonchi continue to improve over time. Normal work of breathing on trach collar. Abdomen: obese, NT, ND, Soft. Non-distended. Extremities: no edema or cyanosis Neuro: tracks, follows commands, moves all extremities   Laboratory: Recent Labs  Lab 07/03/17 0536 07/04/17 0408 07/05/17 0412  WBC 9.7 8.8 8.3  HGB 9.9* 9.8* 9.9*  HCT 34.9*  35.0* 35.6*  PLT 315 316 321   Recent Labs  Lab 07/03/17 0536 07/04/17 0408 07/05/17 0412  NA 140 142 145  K 3.4* 3.4* 3.7  CL 93* 97* 98*  CO2 35* 36* 36*  BUN 55* 50* 42*  CREATININE 1.26* 1.08 1.15  CALCIUM 9.5 9.4 9.7  GLUCOSE 149* 148* 124*    Imaging/Diagnostic Tests:  CT ABDOMEN WITHOUT CONTRAST IMPRESSION: No acute CT finding of the abdomen. Likely  biliary sludge/microlithiasis without evidence of acute inflammation. Post pyloric tube terminates within the first portion the duodenum. 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 Atelectasis/scarring at the lung bases.  Guadalupe Dawn, MD 07/05/2017, 12:06 PM PGY-1, [place] Intern pager: [phone], text pages welcome

## 2017-07-05 NOTE — Progress Notes (Signed)
Patients tube feeds turned off for peg tube placement this morning. Patient is NPO as of now.

## 2017-07-05 NOTE — Evaluation (Signed)
Passy-Muir Speaking Valve - Evaluation Patient Details  Name: Timothy Le MRN: [id number] Date of Birth: 07/18/1960  Today's Date: 07/05/2017 Time: 7416-3845 SLP Time Calculation (min) (ACUTE ONLY): 13 min  Past Medical History:  Past Medical History:  Diagnosis Date  . Asthma   . COPD (chronic obstructive pulmonary disease) ([place])   . Diabetes mellitus without complication ([place])   . Hypertension   . Shortness of breath dyspnea   . Sleep apnea    Past Surgical History:  Past Surgical History:  Procedure Laterality Date  . IR GASTROSTOMY TUBE MOD SED  07/05/2017  . TRACHEOSTOMY TUBE PLACEMENT N/A 06/17/2017   Procedure: TRACHEOSTOMY;  Surgeon: Leta Baptist, MD;  Location: [facility] OR;  Service: ENT;  Laterality: N/A;   HPI:  57 yo male admitted with respiratory distress. Intubated 5/23, trach 5/30 . PMH includes morbid obesity, COPD, asthma, OSA, HTN, DM. PEG placed 6/17 (same day as swallow assessment ordered).   Assessment / Plan / Recommendation Clinical Impression  Pt's cuff deflated on SLP's arrival for PMV assessment. Valve assisted to mobilize thick phlegm through trach and oral cavity with strong reflexive cough. Coughing persisted for extended time with valve donned and doffed intermittently to further promote expectoration of mucous and eventually requested deep suctioning with RN. Pt's respiratory effort decreased but able to achieve deep inhalation for increased vocal intensity. Quality of voice is hoarse, somewhat harsh/strained with valve in place for the majority of 20 minutes with fair-good intelligibility in sentences. RR 24, HR 100 and SpO2 93-97%. Increased RR, work of breathing and mild discomfort noted, valve removed without ari trapping presnet but improved respiration described by pt. Recommend trials of PMV with ST only at present. SLP Visit Diagnosis: Aphonia (R49.1)    SLP Assessment  Patient needs continued Speech Lanaguage Pathology Services    Follow Up  Recommendations  Other (comment)(TBD)    Frequency and Duration min 2x/week  2 weeks    PMSV Trial PMSV was placed for: 20 min Able to redirect subglottic air through upper airway: Yes Able to Attain Phonation: Yes Voice Quality: Hoarse Able to Expectorate Secretions: Yes Level of Secretion Expectoration with PMSV: Tracheal;Oral Breath Support for Phonation: Moderately decreased Intelligibility: Intelligibility reduced Word: 75-100% accurate Phrase: 75-100% accurate Sentence: 50-74% accurate Conversation: 50-74% accurate Respirations During Trial: 25 SpO2 During Trial: (93-97%) Pulse During Trial: 100 Behavior: Alert;Controlled;Cooperative;Responsive to questions   Tracheostomy Tube       Vent Dependency  FiO2 (%): 35 %    Cuff Deflation Trial  GO Tolerated Cuff Deflation: (deflated on arrival) Behavior: Alert;Controlled;Cooperative;Responsive to questions        Timothy Le 07/05/2017, 4:43 PM   [name].Ed [employer] [phone]

## 2017-07-06 ENCOUNTER — Inpatient Hospital Stay (HOSPITAL_COMMUNITY): Payer: Medicaid Other

## 2017-07-06 DIAGNOSIS — L02214 Cutaneous abscess of groin: Secondary | ICD-10-CM

## 2017-07-06 LAB — GLUCOSE, CAPILLARY
GLUCOSE-CAPILLARY: 107 mg/dL — AB (ref 65–99)
GLUCOSE-CAPILLARY: 113 mg/dL — AB (ref 65–99)
GLUCOSE-CAPILLARY: 131 mg/dL — AB (ref 65–99)
GLUCOSE-CAPILLARY: 98 mg/dL (ref 65–99)
Glucose-Capillary: 115 mg/dL — ABNORMAL HIGH (ref 65–99)
Glucose-Capillary: 104 mg/dL — ABNORMAL HIGH (ref 65–99)

## 2017-07-06 LAB — CBC WITH DIFFERENTIAL/PLATELET
ABS IMMATURE GRANULOCYTES: 0.1 10*3/uL (ref 0.0–0.1)
BASOS PCT: 1 %
Basophils Absolute: 0 10*3/uL (ref 0.0–0.1)
Eosinophils Absolute: 0.3 10*3/uL (ref 0.0–0.7)
Eosinophils Relative: 3 %
HCT: 35.5 % — ABNORMAL LOW (ref 39.0–52.0)
HEMOGLOBIN: 10 g/dL — AB (ref 13.0–17.0)
IMMATURE GRANULOCYTES: 1 %
LYMPHS PCT: 27 %
Lymphs Abs: 2.3 10*3/uL (ref 0.7–4.0)
MCH: 25.8 pg — ABNORMAL LOW (ref 26.0–34.0)
MCHC: 28.2 g/dL — ABNORMAL LOW (ref 30.0–36.0)
MCV: 91.5 fL (ref 78.0–100.0)
Monocytes Absolute: 0.8 10*3/uL (ref 0.1–1.0)
Monocytes Relative: 10 %
NEUTROS ABS: 5 10*3/uL (ref 1.7–7.7)
NEUTROS PCT: 58 %
PLATELETS: 277 10*3/uL (ref 150–400)
RBC: 3.88 MIL/uL — ABNORMAL LOW (ref 4.22–5.81)
RDW: 17.8 % — ABNORMAL HIGH (ref 11.5–15.5)
WBC: 8.4 10*3/uL (ref 4.0–10.5)

## 2017-07-06 LAB — BASIC METABOLIC PANEL
ANION GAP: 10 (ref 5–15)
BUN: 43 mg/dL — AB (ref 6–20)
CHLORIDE: 102 mmol/L (ref 101–111)
CO2: 35 mmol/L — ABNORMAL HIGH (ref 22–32)
Calcium: 9.7 mg/dL (ref 8.9–10.3)
Creatinine, Ser: 1.29 mg/dL — ABNORMAL HIGH (ref 0.61–1.24)
GFR calc Af Amer: >60 mL/min (ref >60)
GFR calc non Af Amer: >60 mL/min (ref >60)
Glucose, Bld: 111 mg/dL — ABNORMAL HIGH (ref 65–99)
POTASSIUM: 4.3 mmol/L (ref 3.5–5.1)
SODIUM: 147 mmol/L — AB (ref 135–145)

## 2017-07-06 MED ORDER — DOXYCYCLINE HYCLATE 100 MG PO TABS
100.0000 mg | ORAL_TABLET | Freq: Two times a day (BID) | ORAL | Status: DC
  Administered 2017-07-06 – 2017-07-15 (×18): 100 mg via ORAL
  Filled 2017-07-06 (×19): qty 1

## 2017-07-06 MED ORDER — RESOURCE THICKENUP CLEAR PO POWD
ORAL | Status: DC | PRN
  Filled 2017-07-06: qty 125

## 2017-07-06 MED ORDER — QUETIAPINE FUMARATE 25 MG PO TABS
50.0000 mg | ORAL_TABLET | Freq: Every day | ORAL | Status: DC
  Administered 2017-07-06 – 2017-07-09 (×4): 50 mg
  Filled 2017-07-06 (×3): qty 2
  Filled 2017-07-06 (×2): qty 1

## 2017-07-06 MED ORDER — CHLORHEXIDINE GLUCONATE 0.12 % MT SOLN
15.0000 mL | Freq: Two times a day (BID) | OROMUCOSAL | Status: DC
  Administered 2017-07-06 – 2017-07-15 (×17): 15 mL via OROMUCOSAL
  Filled 2017-07-06 (×12): qty 15

## 2017-07-06 MED ORDER — CLONAZEPAM 0.25 MG PO TBDP
0.2500 mg | ORAL_TABLET | Freq: Every day | ORAL | Status: DC
  Administered 2017-07-06 – 2017-07-08 (×3): 0.25 mg
  Filled 2017-07-06 (×3): qty 1

## 2017-07-06 MED ORDER — CLONAZEPAM 0.5 MG PO TABS: 0.2500 mg | ORAL_TABLET | Freq: Every day | ORAL | Status: DC

## 2017-07-06 MED ORDER — ORAL CARE MOUTH RINSE
15.0000 mL | Freq: Two times a day (BID) | OROMUCOSAL | Status: DC
Start: 2017-07-06 — End: 2017-07-15
  Administered 2017-07-06 – 2017-07-15 (×18): 15 mL via OROMUCOSAL

## 2017-07-06 NOTE — Progress Notes (Addendum)
Physical Therapy Wound Treatment/DC Note Patient Details  Name: Timothy Le MRN: [id number] Date of Birth: [birth year]  Today's Date: 07/06/2017 Time: 2831-5176 Time Calculation (min): 36 min  Subjective  Subjective: Pt awake and on trach collar. Patient and Family Stated Goals: None stated Prior Treatments: surgical debridement 6/4  Pain Score: Pain Score: Premedicated. Grimacing at times.  Wound Assessment  Pressure Injury Stage II -  Partial thickness loss of dermis presenting as a shallow open ulcer with a red, pink wound bed without slough. 2 CM x 2CM excoriated  (Active)  Dressing Type ABD;Barrier Film (skin prep);Gauze (Comment);Moist to dry 07/06/2017 10:00 AM  Dressing Changed;Clean;Dry;Intact 07/06/2017 10:00 AM  Dressing Change Frequency Daily 07/06/2017 10:00 AM  State of Healing Early/partial granulation 07/06/2017 10:00 AM  Site / Wound Assessment Red;Yellow 07/06/2017 10:00 AM  % Wound base Red or Granulating 90% 07/06/2017 10:00 AM  % Wound base Yellow/Fibrinous Exudate 10% 07/06/2017 10:00 AM  % Wound base Black/Eschar 0% 07/06/2017 10:00 AM  % Wound base Other/Granulation Tissue (Comment) 0% 07/06/2017 10:00 AM  Peri-wound Assessment Intact 07/06/2017 10:00 AM  Wound Length (cm) 8.1 cm 06/30/2017 11:00 AM  Wound Width (cm) 8.2 cm 06/30/2017 11:00 AM  Wound Depth (cm) 2.5 cm 06/30/2017 11:00 AM  Wound Surface Area (cm^2) 66.42 cm^2 06/30/2017 11:00 AM  Wound Volume (cm^3) 166.05 cm^3 06/30/2017 11:00 AM  Tunneling (cm) 0 06/23/2017 11:20 AM  Undermining (cm) 0 06/23/2017 11:20 AM  Margins Unattached edges (unapproximated) 07/06/2017 10:00 AM  Drainage Amount Moderate 07/06/2017 10:00 AM  Drainage Description Purulent 07/06/2017 10:00 AM  Treatment Debridement (Selective);Hydrotherapy (Pulse lavage);Packing (Saline gauze) 07/06/2017 10:00 AM   Santyl applied to wound bed prior to applying dressing.    Hydrotherapy Pulsed lavage therapy - wound location: central buttocks Pulsed  Lavage with Suction (psi): 8 psi(4-8) Pulsed Lavage with Suction - Normal Saline Used: 1000 mL Pulsed Lavage Tip: Tip with splash shield Selective Debridement Selective Debridement - Location: central buttocks Selective Debridement - Tools Used: Forceps;Scissors Selective Debridement - Tissue Removed: minimal yellow slough   Wound Assessment and Plan  Wound Therapy - Assess/Plan/Recommendations Wound Therapy - Clinical Statement: Wound continues to improve with little necrotic tissue present. Fibrous white material at base of wound. Feel we have reached maximum benefit of hydrotherapy. MD in to look at wound. Will sign off for hydrotherapy and further dressing changes by nursing. Recommend PT Consult for mobility. Wound Therapy - Functional Problem List: Acute pain, decreased tolerance for position changes Factors Delaying/Impairing Wound Healing: Diabetes Mellitus;Incontinence;Immobility;Multiple medical problems;Polypharmacy Hydrotherapy Plan: Other (comment)(DC hydrotherapy) Wound Therapy - Follow Up Recommendations: Skilled nursing facility Wound Plan: See above  Wound Therapy Goals- Improve the function of patient's integumentary system by progressing the wound(s) through the phases of wound healing (inflammation - proliferation - remodeling) by: Decrease Necrotic Tissue to: 0 Decrease Necrotic Tissue - Progress: Partly met Increase Granulation Tissue to: 100% Increase Granulation Tissue - Progress: Partly met  Goals will be updated until maximal potential achieved or discharge criteria met.  Discharge criteria: when goals achieved, discharge from hospital, MD decision/surgical intervention, no progress towards goals, refusal/missing three consecutive treatments without notification or medical reason.  GP     [name] 07/06/2017, 10:59 AM Suanne Marker PT [phone]

## 2017-07-06 NOTE — Consult Note (Signed)
[facility] Nurse wound consult note Reason for Consult: Left inner groin wound I was able to secure additional help of the primary RN, and PT and with the aide of a strong flashlight and good positioning, found an open area that measures 1 cm x 0.4 cm x 0.4 cm at the junction of the left mid-scrotum and upper thigh.  When palpated purulent looking drainage came oozing out and I was able to palpate a hardened area adjacent to the scrotum.  I suspect he has an abscess that will need further evaluation.  I will reach out to his medical team to update them of my findings. Monitor the wound area(s) for worsening of condition such as: Signs/symptoms of infection,  Increase in size,  Development of or worsening of odor, Development of pain, or increased pain at the affected locations.  Notify the medical team if any of these develop.  Thank you for the consult.  Discussed plan of care with the patient and bedside nurse.  [facility] nurse will not follow at this time.  Please re-consult the [place] team if needed.  Val Riles, RN, MSN, CWOCN, CNS-BC, pager [phone]

## 2017-07-06 NOTE — Progress Notes (Addendum)
  Speech Language Pathology Treatment: Dysphagia;Passy Muir Speaking valve  Patient Details Name: Timothy Le MRN: [id number] DOB: [birth year] Today's Date: 07/06/2017 Time: 2694-8546 SLP Time Calculation (min) (ACUTE ONLY): 29 min  Assessment / Plan / Recommendation Clinical Impression  Pt wore PMV for approximately 20 minutes with no overt signs of intolerance. He needs Mod cues to increase his vocal intensity to facilitate intelligibility. His voice is rough and deep, but with good volume when prompted. Recommend wearing PMV when full staff supervision can be provided.  Pt also consumed ice chips without overt signs of dysphagia. Recommend proceeding with FEES to more objectively assess oropharyngeal function given prolonged intubation, new trach, and prolonged hospital course without POs. FEES tentatively planned for this afternoon.   HPI HPI: 57 yo male admitted with respiratory distress. Intubated 5/23, trach 5/30 . PMH includes morbid obesity, COPD, asthma, OSA, HTN, DM. PEG placed 6/17 (same day as swallow assessment ordered).      SLP Plan  Other (Comment)(FEES)       Recommendations  Diet recommendations: NPO;Other(comment)(ice chips) Medication Administration: Via alternative means      Patient may use Passy-Muir Speech Valve: Intermittently with supervision PMSV Supervision: Full MD: Please consider changing trach tube to : Cuffless;Smaller size         Oral Care Recommendations: Oral care QID Follow up Recommendations: (tba) SLP Visit Diagnosis: Dysphagia, unspecified (R13.10);Aphonia (R49.1) Plan: Other (Comment)(FEES)       GO                Germain Osgood 07/06/2017, 10:10 AM  Germain Osgood, M.A. CCC-SLP [phone]

## 2017-07-06 NOTE — Progress Notes (Signed)
[facility]/Trauma Progress Note  19 Days Post-Op   Assessment/Plan Active Problems:   Acute respiratory failure (HCC)   Morbid obesity (HCC)   Respiratory distress   Respiratory failure with hypoxia and hypercapnia (HCC)   Difficult airway for intubation   Pressure injury of skin   ARDS (adult respiratory distress syndrome) (HCC)   Acute pulmonary edema (HCC)   HCAP (healthcare-associated pneumonia)   Encounter for PEG (percutaneous endoscopic gastrostomy) ([place])  Left groin abscess - actively draining, recommend a broad spectrum abx for 5-7 days to cover skin flora - monitor daily  We will follow.     LOS: 27 days    Subjective: CC: left groin abscess  Pt states he felt the abscess in his groin before he got sick. He is not having pain there currently. No fevers.   Objective: Vital signs in last 24 hours: Temp:  [98.2 F (36.8 C)-99.3 F (37.4 C)] 98.2 F (36.8 C) (06/18 1237) Pulse Rate:  [80-117] 92 (06/18 1502) Resp:  [17-29] 22 (06/18 1502) BP: (95-138)/(61-92) 102/62 (06/18 1502) SpO2:  [90 %-100 %] 96 % (06/18 1502) FiO2 (%):  [28 %-35 %] 28 % (06/18 1502) Weight:  [150 kg (330 lb 11 oz)] 150 kg (330 lb 11 oz) (06/18 0500) Last BM Date: 07/04/17  Intake/Output from previous day: 06/17 0701 - 06/18 0700 In: 390 [I.V.:50] Out: 1235 [Urine:1235] Intake/Output this shift: Total I/O In: 250 [Other:250] Out: 350 [Urine:350]  PE: Gen:  Alert, NAD, pleasant, cooperative Pulm:  On trach collar, rate and effort normal GU: posterior left groin abscess with open wound and actively drainage minimal purulent bloody drainage. Mildly TTP. Mild induration and small area of fluctuance Skin: no rashes noted, warm and dry   Anti-infectives: Anti-infectives (From admission, onward)   Start     Dose/Rate Route Frequency Ordered Stop   07/05/17 0952  ceFAZolin (ANCEF) 2-4 GM/100ML-% IVPB    Note to Pharmacy:  Arlean Hopping   : cabinet override       07/05/17 0952 07/05/17 1000   07/05/17 0945  ceFAZolin (ANCEF) IVPB 2g/100 mL premix     2 g 200 mL/hr over 30 Minutes Intravenous  Once 07/05/17 0944 07/05/17 1027   07/01/17 0600  ceFAZolin (ANCEF) IVPB 2g/100 mL premix  Status:  Discontinued     2 g 200 mL/hr over 30 Minutes Intravenous To Radiology 06/30/17 1016 07/02/17 0600   06/26/17 1200  cefTRIAXone (ROCEPHIN) 1 g in sodium chloride 0.9 % 100 mL IVPB     1 g 200 mL/hr over 30 Minutes Intravenous Every 24 hours 06/26/17 1100 07/02/17 1258   06/17/17 0900  ceFAZolin (ANCEF) 3 g in dextrose 5 % 50 mL IVPB     3 g 100 mL/hr over 30 Minutes Intravenous To Surgery 06/17/17 0858 06/17/17 0902   06/10/17 2200  levofloxacin (LEVAQUIN) IVPB 750 mg  Status:  Discontinued     750 mg 100 mL/hr over 90 Minutes Intravenous Every 24 hours 06/10/17 1031 06/11/17 1535   06/09/17 2230  levofloxacin (LEVAQUIN) IVPB 750 mg  Status:  Discontinued     750 mg 100 mL/hr over 90 Minutes Intravenous Every 24 hours 06/09/17 2120 06/10/17 0104      Lab Results:  Recent Labs    07/05/17 0412 07/06/17 0544  WBC 8.3 8.4  HGB 9.9* 10.0*  HCT 35.6* 35.5*  PLT 321 277   BMET Recent Labs    07/05/17 0412 07/06/17 0544  NA 145 147*  K  3.7 4.3  CL 98* 102  CO2 36* 35*  GLUCOSE 124* 111*  BUN 42* 43*  CREATININE 1.15 1.29*  CALCIUM 9.7 9.7   PT/INR Recent Labs    07/05/17 1259  LABPROT 15.5*  INR 1.24   CMP     Component Value Date/Time   NA 147 (H) 07/06/2017 0544   K 4.3 07/06/2017 0544   CL 102 07/06/2017 0544   CO2 35 (H) 07/06/2017 0544   GLUCOSE 111 (H) 07/06/2017 0544   BUN 43 (H) 07/06/2017 0544   CREATININE 1.29 (H) 07/06/2017 0544   CALCIUM 9.7 07/06/2017 0544   PROT 7.8 06/10/2017 0347   ALBUMIN 2.1 (L) 06/23/2017 0449   AST 26 06/10/2017 0347   ALT 13 (L) 06/10/2017 0347   ALKPHOS 62 06/10/2017 0347   BILITOT 1.1 06/10/2017 0347   GFRNONAA >60 07/06/2017 0544   GFRAA >60 07/06/2017 0544   Lipase  No results  found for: LIPASE  Studies/Results: Ir Gastrostomy Tube Mod Sed  Result Date: 07/05/2017 INDICATION: Tracheostomy tube EXAM: PERC PLACEMENT GASTROSTOMY MEDICATIONS: Ancef 2 g; Antibiotics were administered within 1 hour of the procedure. Glucagon 1 mg IV ANESTHESIA/SEDATION: Versed 1.5 mg IV; Fentanyl 75 mcg IV Moderate Sedation Time:  14 minutes The patient was continuously monitored during the procedure by the interventional radiology nurse under my direct supervision. CONTRAST:  10 cc Isovue-300-administered into the gastric lumen. FLUOROSCOPY TIME:  Fluoroscopy Time: 4 minutes 30 seconds (144 mGy). COMPLICATIONS: None immediate. PROCEDURE: The procedure, risks, benefits, and alternatives were explained to the patient. Questions regarding the procedure were encouraged and answered. The patient understands and consents to the procedure. The epigastrium was prepped with Betadine in a sterile fashion, and a sterile drape was applied covering the operative field. A sterile gown and sterile gloves were used for the procedure. A 5-French orogastric tube is placed under fluoroscopic guidance. Scout imaging of the abdomen confirms barium within the transverse colon. The stomach was distended with gas. Under fluoroscopic guidance, an 18 gauge needle was utilized to puncture the anterior wall of the body of the stomach. An Amplatz wire was advanced through the needle passing a T fastener into the lumen of the stomach. The T fastener was secured for gastropexy. A 9-French sheath was inserted. A snare was advanced through the 9-French sheath. A Britta Mccreedy was advanced through the orogastric tube. It was snared then pulled out the oral cavity, pulling the snare, as well. The leading edge of the gastrostomy was attached to the snare. It was then pulled down the esophagus and out the percutaneous site. It was secured in place. Contrast was injected. The image demonstrates placement of a 20-French pull-through type gastrostomy  tube into the body of the stomach. IMPRESSION: Successful 20 French pull-through gastrostomy. Electronically Signed   By: Marybelle Killings M.D.   On: 07/05/2017 14:40   [place] Lt Lower Extrem Ltd Soft Tissue Non Vascular  Result Date: 07/06/2017 CLINICAL DATA:  History of draining wound in the left inguinal region EXAM: ULTRASOUND LEFT LOWER EXTREMITY LIMITED TECHNIQUE: Ultrasound examination of the lower extremity soft tissues was performed in the area of clinical concern. COMPARISON:  None. FINDINGS: There is an irregular hypoechoic collection beneath the skin measuring 3.2 x 0.6 x 1.9 cm. Given the clinical history this likely represents a small subcutaneous abscess. Some surrounding edema is noted. IMPRESSION: Changes consistent with a small subcutaneous abscess. Electronically Signed   By: Inez Catalina M.D.   On: 07/06/2017 12:53  Kalman Drape , [facility] 07/06/2017, 3:54 PM  Pager: [phone] Mon-Wed, Friday 7:00am-4:30pm Thurs 7am-11:30am  Consults: [phone]

## 2017-07-06 NOTE — Progress Notes (Addendum)
Family Medicine Teaching Service Daily Progress Note Intern Pager: [phone]  Patient name: Timothy Le Medical record number: [id number] Date of birth: 09/06/1960 Age: 57 y.o. Gender: male  Primary Care Provider: Kerin Perna, NP Consultants: PCCM, IR, Surgery, WOC Code Status: full  Pt Overview and Major Events to Date:  5/22 admitted to fpts-> subsequent decompensation, xfer to pccm 5/23 intubated 5/30 Tracheostomy 6/4 bedside sacral wound debridement in OR 6/17 PEG tube placement  Assessment and Plan:  Respiratory failure with hypercarbia  Obesity hypoventilation syndrome Tolerating trach collar. Did ok with bedside swallow, will need FEES to formally evaluate. If passes FEES can advance to allow some liquid PO intake. Will also supplement through G-tube.  - Appreciate CCM recs for trach management - F/U FEES, if possible can give PO water - vital signs per stepdown routine - torsemide 10mg  daily - prn metolazone for increased swelling - albuterol 2.5mg  q 2 hours prn - pepcid 20 mg bid - prostate high sugar 27mL bid, vital high protein 39mL/hr - decrease klonopin 0.5mg  daily - decrease seroquel 50mg  every morning, 50mg  seroquel at night  Possible left groin abscess Patient with new drainage of purulent fluid. Indurated areas with open area now in left groin. Will get ultrasound to evaluate. If consistent with abscess will call surgery for I&D eval due to possibility for structural damage in that area. - follow up skin ultrasound left groin  Sacral wound S/P debridement by surgery on 6/4. Surgery and Wound care following. Beefy red, no eschar. Connective tissue still left over sacral bone but otherwise no other tissue present in wound. Per wound care recs, no more hydrotherapy indicated. Continue santyl, wet to dry dressing change BID. - Follow up wound care recs - santyl, bid wet to drys - fentanyl 25-100 mcg q2prn for pain from wound/with wound  care  Tracheostomy- Trach change on 6/12 by ENT - Trach care per routine.  - wean vent as able, t-collar during day  - CCM following for trach care & vent needs - Likely trach size change around 6/26  Anemia- stable Likely due to a combination of blood loss from sacral wound, trach, and anemia of chronic disease.  -Transfuse if hgb falls below 7.  FEN/GI: Vital high protein liquid 28mL/hr, sugar free pro-state 27mL bid PPx: pepcid, lovenox  Disposition: pending peg tube placement, trach/vent needs  Subjective:  Receiving hydrotherapy, tolerated well  Objective: Temp:  [98.4 F (36.9 C)-99.3 F (37.4 C)] 98.8 F (37.1 C) (06/18 0822) Pulse Rate:  [80-117] 88 (06/18 1159) Resp:  [17-29] 21 (06/18 1159) BP: (95-138)/(58-92) 103/66 (06/18 1159) SpO2:  [90 %-100 %] 98 % (06/18 1159) FiO2 (%):  [28 %-35 %] 28 % (06/18 1159) Weight:  [330 lb 11 oz (150 kg)] 330 lb 11 oz (150 kg) (06/18 0500) Physical Exam: Gen: morbidly obese African American male resting comfortably in bed, tolerating trach collar well Heart:rrr, no m/r/g Lungs: Exam limited by body habitus. Coarse rhonchi continue to improve over time. Normal work of breathing on trach collar. Abdomen: obese, NT, ND, Soft. Non-distended. Extremities: no edema or cyanosis Neuro: tracks, follows commands, moves all extremities  Sacrum: stage 4 sacral decubitus ulcer present Left groin: indurated area draining bloody, purulent fluid  Laboratory: Recent Labs  Lab 07/04/17 0408 07/05/17 0412 07/06/17 0544  WBC 8.8 8.3 8.4  HGB 9.8* 9.9* 10.0*  HCT 35.0* 35.6* 35.5*  PLT 316 321 277   Recent Labs  Lab 07/04/17 0408 07/05/17 0412 07/06/17 0544  NA 142 145 147*  K 3.4* 3.7 4.3  CL 97* 98* 102  CO2 36* 36* 35*  BUN 50* 42* 43*  CREATININE 1.08 1.15 1.29*  CALCIUM 9.4 9.7 9.7  GLUCOSE 148* 124* 111*    Imaging/Diagnostic Tests:  CT ABDOMEN WITHOUT CONTRAST IMPRESSION: No acute CT finding of the abdomen. Likely  biliary sludge/microlithiasis without evidence of acute inflammation. Post pyloric tube terminates within the first portion the duodenum. 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 Atelectasis/scarring at the lung bases.  Guadalupe Dawn, MD 07/06/2017, 12:06 PM PGY-1, [place] Intern pager: [phone], text pages welcome

## 2017-07-06 NOTE — Progress Notes (Signed)
Family medicine progress note  Discussion with pharmacy team regarding antibiotic coverage for left groin abscess. Felt that doxycycline is best oral agent for him. Considered IV vanc but given his size this makes dosing very challenging. Doxy will give best coverage of staph(including MRSA), GNR/anaerobes, atypicals. Given that patient is now taking in some PO will try this. If fails to get better could consider broadening to zyvox. Likely 10-14 day course. Will follow, appreciate surgery input.  Guadalupe Dawn MD PGY-1 Family Medicine Resident

## 2017-07-06 NOTE — Consult Note (Signed)
[facility] Nurse wound consult note Reason for Consult: I spoke with Dr. Kris Mouton in [facility]M about the patient and the area on the left inner thigh/scrotal juncture, as well as my suspicions.  He will evaluate and plan for care.  Val Riles, RN, MSN, CWOCN, CNS-BC, pager [phone]

## 2017-07-06 NOTE — Procedures (Signed)
Objective Swallowing Evaluation: Type of Study: FEES-Fiberoptic Endoscopic Evaluation of Swallow   Patient Details  Name: Timothy Le MRN: [id number] Date of Birth: 05-Jan-1961  Today's Date: 07/06/2017 Time: SLP Start Time (ACUTE ONLY): 1316 -SLP Stop Time (ACUTE ONLY): 6384  SLP Time Calculation (min) (ACUTE ONLY): 36 min   Past Medical History:  Past Medical History:  Diagnosis Date  . Asthma   . COPD (chronic obstructive pulmonary disease) ([place])   . Diabetes mellitus without complication ([place])   . Hypertension   . Shortness of breath dyspnea   . Sleep apnea    Past Surgical History:  Past Surgical History:  Procedure Laterality Date  . IR GASTROSTOMY TUBE MOD SED  07/05/2017  . TRACHEOSTOMY TUBE PLACEMENT N/A 06/17/2017   Procedure: TRACHEOSTOMY;  Surgeon: Leta Baptist, MD;  Location: [facility] OR;  Service: ENT;  Laterality: N/A;   HPI: 57 yo male admitted with respiratory distress. Intubated 5/23, trach 5/30 . PMH includes morbid obesity, COPD, asthma, OSA, HTN, DM. PEG placed 6/17 (same day as swallow assessment ordered).   Subjective: pt alert, very eager for POs    Assessment / Plan / Recommendation  CHL IP CLINICAL IMPRESSIONS 07/06/2017  Clinical Impression Pt has a moderate oropharyngeal dysphagia secondary to prolonged intubation and deconditioning with generalized pharyngeal weakness. Premature spillage occurs with ice chips and nectar thick liquids, with inadequate airway closure and timing that then allows nectar thick liquids to be penetrated and aspirated before the swallow. Intermittent throat clearing occurs throughout the study that does not necessarily correlate with impaired airway protection, but weak coughing did seem to occur immediately post-aspiration. SLP provided Min cues for more effortful cough to better clear the airway. Cues were also given for a chin tuck maneuver, although minimal change in posture was noted and therefore minimal change in function as  well. Pt has better oral containment and timing with honey thick liquids and solids, although mild residue remains pharyngeally. Recommend Dys 2 diet and honey thick liquids with PMV in place. Ice chips would still be okay in between meals after oral care. Anticipate good prognosis for advancement with additional time and reconditioning.   SLP Visit Diagnosis Dysphagia, oropharyngeal phase (R13.12)  Attention and concentration deficit following --  Frontal lobe and executive function deficit following --  Impact on safety and function Moderate aspiration risk      CHL IP TREATMENT RECOMMENDATION 07/06/2017  Treatment Recommendations Therapy as outlined in treatment plan below     Prognosis 07/06/2017  Prognosis for Safe Diet Advancement Good  Barriers to Reach Goals --  Barriers/Prognosis Comment --    CHL IP DIET RECOMMENDATION 07/06/2017  SLP Diet Recommendations Dysphagia 2 (Fine chop) solids;Honey thick liquids  Liquid Administration via Cup;No straw  Medication Administration Whole meds with puree  Compensations Slow rate;Small sips/bites  Postural Changes Remain semi-upright after after feeds/meals (Comment);Seated upright at 90 degrees      CHL IP OTHER RECOMMENDATIONS 07/06/2017  Recommended Consults --  Oral Care Recommendations Oral care BID  Other Recommendations Order thickener from pharmacy;Prohibited food (jello, ice cream, thin soups);Remove water pitcher;Place PMSV during PO intake      CHL IP FOLLOW UP RECOMMENDATIONS 07/06/2017  Follow up Recommendations (No Data)      CHL IP FREQUENCY AND DURATION 07/06/2017  Speech Therapy Frequency (ACUTE ONLY) min 2x/week  Treatment Duration 2 weeks           CHL IP ORAL PHASE 07/06/2017  Oral Phase Impaired  Oral - Pudding  Teaspoon --  Oral - Pudding Cup --  Oral - Honey Teaspoon --  Oral - Honey Cup WFL  Oral - Nectar Teaspoon Premature spillage  Oral - Nectar Cup Premature spillage  Oral - Nectar Straw --  Oral -  Thin Teaspoon Premature spillage;Other (Comment)  Oral - Thin Cup --  Oral - Thin Straw --  Oral - Puree WFL  Oral - Mech Soft WFL  Oral - Regular --  Oral - Multi-Consistency --  Oral - Pill --  Oral Phase - Comment --    CHL IP PHARYNGEAL PHASE 07/06/2017  Pharyngeal Phase Impaired  Pharyngeal- Pudding Teaspoon --  Pharyngeal --  Pharyngeal- Pudding Cup --  Pharyngeal --  Pharyngeal- Honey Teaspoon --  Pharyngeal --  Pharyngeal- Honey Cup Delayed swallow initiation-vallecula;Reduced pharyngeal peristalsis;Reduced anterior laryngeal mobility;Reduced laryngeal elevation;Reduced airway/laryngeal closure;Pharyngeal residue - valleculae;Pharyngeal residue - pyriform;Lateral channel residue  Pharyngeal --  Pharyngeal- Nectar Teaspoon Reduced pharyngeal peristalsis;Reduced anterior laryngeal mobility;Reduced laryngeal elevation;Reduced airway/laryngeal closure;Pharyngeal residue - valleculae;Pharyngeal residue - pyriform;Lateral channel residue;Delayed swallow initiation-pyriform sinuses;Penetration/Aspiration before swallow  Pharyngeal Material enters airway, remains ABOVE vocal cords and not ejected out  Pharyngeal- Nectar Cup Reduced pharyngeal peristalsis;Reduced anterior laryngeal mobility;Reduced laryngeal elevation;Reduced airway/laryngeal closure;Pharyngeal residue - valleculae;Pharyngeal residue - pyriform;Lateral channel residue;Penetration/Aspiration before swallow;Delayed swallow initiation-pyriform sinuses  Pharyngeal Material enters airway, passes BELOW cords and not ejected out despite cough attempt by patient  Pharyngeal- Nectar Straw --  Pharyngeal --  Pharyngeal- Thin Teaspoon Reduced pharyngeal peristalsis;Reduced anterior laryngeal mobility;Reduced laryngeal elevation;Reduced airway/laryngeal closure;Delayed swallow initiation-pyriform sinuses;Other (Comment)  Pharyngeal --  Pharyngeal- Thin Cup --  Pharyngeal --  Pharyngeal- Thin Straw --  Pharyngeal --  Pharyngeal-  Puree Delayed swallow initiation-vallecula;Reduced pharyngeal peristalsis;Reduced anterior laryngeal mobility;Reduced laryngeal elevation;Reduced airway/laryngeal closure;Pharyngeal residue - pyriform;Lateral channel residue  Pharyngeal --  Pharyngeal- Mechanical Soft Delayed swallow initiation-vallecula;Reduced pharyngeal peristalsis;Reduced anterior laryngeal mobility;Reduced laryngeal elevation;Reduced airway/laryngeal closure;Pharyngeal residue - pyriform;Lateral channel residue  Pharyngeal --  Pharyngeal- Regular --  Pharyngeal --  Pharyngeal- Multi-consistency --  Pharyngeal --  Pharyngeal- Pill --  Pharyngeal --  Pharyngeal Comment --     CHL IP CERVICAL ESOPHAGEAL PHASE 07/06/2017  Cervical Esophageal Phase WFL  Pudding Teaspoon --  Pudding Cup --  Honey Teaspoon --  Honey Cup --  Nectar Teaspoon --  Nectar Cup --  Nectar Straw --  Thin Teaspoon --  Thin Cup --  Thin Straw --  Puree --  Mechanical Soft --  Regular --  Multi-consistency --  Pill --  Cervical Esophageal Comment --    No flowsheet data found.  Germain Osgood 07/06/2017, 2:15 PM   Germain Osgood, M.A. CCC-SLP [phone]

## 2017-07-06 NOTE — Progress Notes (Signed)
Referring Physician(s): Dr Verlon Au  Supervising Physician: Daryll Brod  Patient Status:  [facility] - In-pt  Chief Complaint:  G tube placed in IR yesterday  Subjective:  Restful Able to nod answers No complaints  Allergies: Patient has no known allergies.  Medications: Prior to Admission medications   Medication Sig Start Date End Date Taking? Authorizing Provider  amLODipine (NORVASC) 10 MG tablet Take 10 mg by mouth daily.   Yes [provider]  atorvastatin (LIPITOR) 10 MG tablet Take 10 mg by mouth daily.   Yes [provider]  furosemide (LASIX) 40 MG tablet Take 1 tablet (40 mg total) by mouth 2 (two) times daily. 10/10/16  Yes Virgel Manifold, MD  gemfibrozil (LOPID) 600 MG tablet Take 600 mg by mouth 2 (two) times daily before a meal. Thirty minutes before morning and evening meal.   Yes [provider]  guaiFENesin (MUCINEX) 600 MG 12 hr tablet Take 1 tablet (600 mg total) by mouth 2 (two) times daily. 06/21/16  Yes Barton Dubois, MD  insulin glargine (LANTUS) 100 UNIT/ML injection Inject 0.6 mLs (60 Units total) into the skin daily. 04/12/15  Yes Geradine Girt, DO  lisinopril-hydrochlorothiazide (PRINZIDE,ZESTORETIC) 20-25 MG tablet Take 1 tablet by mouth daily.   Yes [provider]  loratadine (CLARITIN) 10 MG tablet 1 daily for allergy while needed Patient taking differently: Take 10 mg by mouth daily as needed for allergies.  11/25/16  Yes Young, Tarri Fuller D, MD  metFORMIN (GLUCOPHAGE) 500 MG tablet Take 1,000 mg by mouth 2 (two) times daily with a meal.   Yes [provider]  blood glucose meter kit and supplies Dispense based on patient and insurance preference. Use up to four times daily as directed. (FOR ICD-9 250.00, 250.01). 03/29/15   [name], MD     Vital Signs: BP 113/67   Pulse 95   Temp 99.3 F (37.4 C) (Oral)   Resp (!) 28   Ht '5\' 6"'  (1.676 m)   Wt (!) 330 lb 11 oz (150 kg)   SpO2 96%   BMI 53.37  kg/m   Physical Exam  Abdominal: Soft. Bowel sounds are normal.  Skin: Skin is warm and dry.  Site is clean and dry Sl tender at site No bleeding  Vitals reviewed.   Imaging: Ir Gastrostomy Tube Mod Sed  Result Date: 07/05/2017 INDICATION: Tracheostomy tube EXAM: PERC PLACEMENT GASTROSTOMY MEDICATIONS: Ancef 2 g; Antibiotics were administered within 1 hour of the procedure. Glucagon 1 mg IV ANESTHESIA/SEDATION: Versed 1.5 mg IV; Fentanyl 75 mcg IV Moderate Sedation Time:  14 minutes The patient was continuously monitored during the procedure by the interventional radiology nurse under my direct supervision. CONTRAST:  10 cc Isovue-300-administered into the gastric lumen. FLUOROSCOPY TIME:  Fluoroscopy Time: 4 minutes 30 seconds (144 mGy). COMPLICATIONS: None immediate. PROCEDURE: The procedure, risks, benefits, and alternatives were explained to the patient. Questions regarding the procedure were encouraged and answered. The patient understands and consents to the procedure. The epigastrium was prepped with Betadine in a sterile fashion, and a sterile drape was applied covering the operative field. A sterile gown and sterile gloves were used for the procedure. A 5-French orogastric tube is placed under fluoroscopic guidance. Scout imaging of the abdomen confirms barium within the transverse colon. The stomach was distended with gas. Under fluoroscopic guidance, an 18 gauge needle was utilized to puncture the anterior wall of the body of the stomach. An Amplatz wire was advanced through the needle  passing a T fastener into the lumen of the stomach. The T fastener was secured for gastropexy. A 9-French sheath was inserted. A snare was advanced through the 9-French sheath. A Britta Mccreedy was advanced through the orogastric tube. It was snared then pulled out the oral cavity, pulling the snare, as well. The leading edge of the gastrostomy was attached to the snare. It was then pulled down the esophagus and out  the percutaneous site. It was secured in place. Contrast was injected. The image demonstrates placement of a 20-French pull-through type gastrostomy tube into the body of the stomach. IMPRESSION: Successful 20 French pull-through gastrostomy. Electronically Signed   By: Marybelle Killings M.D.   On: 07/05/2017 14:40   [place] Ekg Site Rite  Result Date: 07/02/2017 If Site Rite image not attached, placement could not be confirmed due to current cardiac rhythm.   Labs:  CBC: Recent Labs    07/03/17 0536 07/04/17 0408 07/05/17 0412 07/06/17 0544  WBC 9.7 8.8 8.3 8.4  HGB 9.9* 9.8* 9.9* 10.0*  HCT 34.9* 35.0* 35.6* 35.5*  PLT 315 316 321 277    COAGS: Recent Labs    06/30/17 1152 07/05/17 1259  INR 1.15 1.24  APTT  --  33    BMP: Recent Labs    07/03/17 0536 07/04/17 0408 07/05/17 0412 07/06/17 0544  NA 140 142 145 147*  K 3.4* 3.4* 3.7 4.3  CL 93* 97* 98* 102  CO2 35* 36* 36* 35*  GLUCOSE 149* 148* 124* 111*  BUN 55* 50* 42* 43*  CALCIUM 9.5 9.4 9.7 9.7  CREATININE 1.26* 1.08 1.15 1.29*  GFRNONAA >60 >60 >60 >60  GFRAA >60 >60 >60 >60    LIVER FUNCTION TESTS: Recent Labs    01/20/17 0900 06/09/17 1309 06/10/17 0347 06/23/17 0449  BILITOT 0.6 0.6 1.1  --   AST '23 20 26  ' --   ALT 12* 11* 13*  --   ALKPHOS 65 69 62  --   PROT 8.5* 8.2* 7.8  --   ALBUMIN 3.3* 3.1* 3.0* 2.1*    Assessment and Plan:  May use G tube now  Electronically Signed: Editha Bridgeforth A, PA-C 07/06/2017, 7:06 AM   I spent a total of 15 Minutes at the the patient's bedside AND on the patient's hospital floor or unit, greater than 50% of which was counseling/coordinating care for G tube placement

## 2017-07-06 NOTE — Consult Note (Signed)
[place] Nurse wound consult note Reason for Consult: Left inner groin area I assessed the patient in [facility] 2M13.  The patient was awake, aware and assisted with assessment by positioning his legs.  At this point in time I cannot say exactly what is causing the drainage from the very deep fold between the patient's left inner thigh and scrotum.  The area had dry, folded gauze to the region, and when I removed it a few drops of blood drained from the area. I did not see an actual wound to the site. The gauze had a scant amount of serosanginous drainage on it when I removed it.  For now I have ordered a piece of Aqualcell Ag+ to be placed daily. Monitor the wound area(s) for worsening of condition such as: Signs/symptoms of infection,  Increase in size,  Development of or worsening of odor, Development of pain, or increased pain at the affected locations.  Notify the medical team if any of these develop.  Thank you for the consult.  Discussed plan of care with the patient and bedside nurse.  [place] nurse will not follow at this time.  Please re-consult the [place] team if needed.  Val Riles, RN, MSN, CWOCN, CNS-BC, pager [phone]

## 2017-07-07 LAB — CBC WITH DIFFERENTIAL/PLATELET
ABS IMMATURE GRANULOCYTES: 0.1 10*3/uL (ref 0.0–0.1)
BASOS ABS: 0 10*3/uL (ref 0.0–0.1)
BASOS PCT: 1 %
Eosinophils Absolute: 0.3 10*3/uL (ref 0.0–0.7)
Eosinophils Relative: 4 %
HCT: 34.8 % — ABNORMAL LOW (ref 39.0–52.0)
Hemoglobin: 9.7 g/dL — ABNORMAL LOW (ref 13.0–17.0)
Immature Granulocytes: 1 %
Lymphocytes Relative: 28 %
Lymphs Abs: 2.4 10*3/uL (ref 0.7–4.0)
MCH: 25.5 pg — AB (ref 26.0–34.0)
MCHC: 27.9 g/dL — ABNORMAL LOW (ref 30.0–36.0)
MCV: 91.6 fL (ref 78.0–100.0)
MONO ABS: 0.7 10*3/uL (ref 0.1–1.0)
Monocytes Relative: 8 %
NEUTROS ABS: 5 10*3/uL (ref 1.7–7.7)
NEUTROS PCT: 58 %
Platelets: 298 10*3/uL (ref 150–400)
RBC: 3.8 MIL/uL — ABNORMAL LOW (ref 4.22–5.81)
RDW: 17.7 % — ABNORMAL HIGH (ref 11.5–15.5)
WBC: 8.5 10*3/uL (ref 4.0–10.5)

## 2017-07-07 LAB — GLUCOSE, CAPILLARY
GLUCOSE-CAPILLARY: 85 mg/dL (ref 65–99)
GLUCOSE-CAPILLARY: 132 mg/dL — AB (ref 65–99)
GLUCOSE-CAPILLARY: 134 mg/dL — AB (ref 65–99)
Glucose-Capillary: 139 mg/dL — ABNORMAL HIGH (ref 65–99)
Glucose-Capillary: 122 mg/dL — ABNORMAL HIGH (ref 65–99)

## 2017-07-07 LAB — BASIC METABOLIC PANEL
ANION GAP: 7 (ref 5–15)
BUN: 40 mg/dL — ABNORMAL HIGH (ref 6–20)
CALCIUM: 9.2 mg/dL (ref 8.9–10.3)
CHLORIDE: 103 mmol/L (ref 101–111)
CO2: 34 mmol/L — ABNORMAL HIGH (ref 22–32)
Creatinine, Ser: 1.18 mg/dL (ref 0.61–1.24)
GFR calc non Af Amer: >60 mL/min (ref >60)
GLUCOSE: 142 mg/dL — AB (ref 65–99)
Potassium: 3.7 mmol/L (ref 3.5–5.1)
Sodium: 144 mmol/L (ref 135–145)

## 2017-07-07 MED ORDER — VITAL HIGH PROTEIN PO LIQD
1000.0000 mL | ORAL | Status: DC
  Administered 2017-07-08: 1000 mL
  Filled 2017-07-07 (×4): qty 1000

## 2017-07-07 MED ORDER — ALTEPLASE 2 MG IJ SOLR
2.0000 mg | Freq: Once | INTRAMUSCULAR | Status: AC
  Administered 2017-07-07: 2 mg
  Filled 2017-07-07: qty 2

## 2017-07-07 MED ORDER — FENTANYL CITRATE (PF) 100 MCG/2ML IJ SOLN
25.0000 ug | INTRAMUSCULAR | Status: DC | PRN
  Administered 2017-07-07 – 2017-07-15 (×30): 50 ug via INTRAVENOUS
  Filled 2017-07-07 (×30): qty 2

## 2017-07-07 NOTE — Progress Notes (Signed)
  Speech Language Pathology Treatment: Dysphagia;Passy Muir Speaking valve  Patient Details Name: Timothy Le MRN: [id number] DOB: 1960-12-28 Today's Date: 07/07/2017 Time: 1202-1226 SLP Time Calculation (min) (ACUTE ONLY): 24 min  Assessment / Plan / Recommendation Clinical Impression  Pt was agreeable to PMV placement, which he wore for 15 min without overt signs of intolerance. Min-Mod cues were provided for increased vocal intensity or even initiation of phonation to facilitate intelligibility at the sentence level. Pt wanted PO trials as well, but did not want to sit upright for them. With coaxing, he tolerated close to an upright position, although not fully upright, saying that it bothered his sacral wound. Results of FEES and rationale for thickener were reinforced with pt, who is very eager for thin liquids. He consumed 4 ounces of honey thick liquids rapidly with coughing that followed, requiring Mod cues from SLP for single sips that were then consumed without overt signs of aspiration. Pt's impulsivity and positioning put him at risk for aspiration even with recommended consistency. Attempted to discuss this with the pt, but he would turn his head away from SLP during conversation. Recommend to continue Dys 2 diet, honey thick liquids with full supervision. Would continue PMV with full staff supervision as well. Would allow a few, single ice chips in between meals and after oral care - also only with full staff supervision.    HPI HPI: 57 yo male admitted with respiratory distress. Intubated 5/23, trach 5/30 . PMH includes morbid obesity, COPD, asthma, OSA, HTN, DM. PEG placed 6/17 (same day as swallow assessment ordered).      SLP Plan  Continue with current plan of care       Recommendations  Diet recommendations: Dysphagia 2 (fine chop);Honey-thick liquid Liquids provided via: Cup Medication Administration: Whole meds with puree(versus via PEG) Supervision: Patient able to  self feed;Full supervision/cueing for compensatory strategies Compensations: Slow rate;Small sips/bites Postural Changes and/or Swallow Maneuvers: Seated upright 90 degrees;Upright 30-60 min after meal      Patient may use Passy-Muir Speech Valve: Intermittently with supervision PMSV Supervision: Full MD: Please consider changing trach tube to : Cuffless;Smaller size         Oral Care Recommendations: Oral care BID Follow up Recommendations: (tba) SLP Visit Diagnosis: Dysphagia, oropharyngeal phase (R13.12) Plan: Continue with current plan of care       GO                Timothy Le 07/07/2017, 2:42 PM  Timothy Le, M.A. CCC-SLP [phone]

## 2017-07-07 NOTE — Progress Notes (Signed)
Pt transported to 2W22. Verbal report given to receiving RN. Receiving RN to bedside to admit/assess patient upon arrival. All belongings transported with patient. VSS at time of transfer. Pt awake and aware of transfer. Receiving unit aware that patient is due to void since foley removal.

## 2017-07-07 NOTE — NC FL2 (Addendum)
[place] LEVEL OF CARE SCREENING TOOL     IDENTIFICATION  Patient Name: Timothy Le Birthdate: 06-11-1960 Sex: male Admission Date (Current Location): 06/09/2017  [facility] and [place] Number:  Herbalist and Address:  [facility] [place], [place] [address] [place], [place] [address]      Provider Number: [id number]  Attending Physician Name and Address:  Dickie La, MD  Relative Name and Phone Number:       Current Level of Care: Hospital Recommended Level of Care: [place] Prior Approval Number:    Date Approved/Denied:   PASRR Number:   [id number]   Discharge Plan: SNF    Current Diagnoses: Patient Active Problem List   Diagnosis Date Noted  . Encounter for PEG (percutaneous endoscopic gastrostomy) ([place])   . Acute pulmonary edema (HCC)   . HCAP (healthcare-associated pneumonia)   . ARDS (adult respiratory distress syndrome) ([employer])   . Pressure injury of skin 06/19/2017  . Difficult airway for intubation   . Respiratory distress 06/09/2017  . Respiratory failure with hypoxia and hypercapnia ([place]) 06/09/2017  . COPD with acute exacerbation ([place]) 06/17/2016  . Seasonal and perennial allergic rhinitis 05/24/2016  . AKI (acute kidney injury) ([place]) 04/08/2015  . Hypovolemic shock ([place]) 04/08/2015  . DKA, type 2 ([place]) 04/08/2015  . Sepsis ([place]) 04/08/2015  . Hyponatremia 04/08/2015  . DM (diabetes mellitus) ([place]) 03/28/2015  . HTN (hypertension) 03/28/2015  . Near syncope 03/28/2015  . Type 2 diabetes mellitus with hyperosmolar nonketotic hyperglycemia ([place]) 03/28/2015  . Hyperglycemia 03/27/2015  . Acute on chronic respiratory failure with hypoxemia ([place]) 03/27/2015  . Acute on chronic congestive heart failure ([place])   . COPD exacerbation ([place]) 02/07/2015  . Pressure ulcer 12/28/2014  . COPD mixed type ([place]) 12/27/2014  . Acute respiratory failure ([place]) 12/27/2014  . Morbid obesity ([place]) 12/27/2014  .  Diabetes mellitus type 2, controlled ([place]) 12/27/2014  . Obesity, morbid ([place]) 07/29/2013  . Obstructive sleep apnea 07/29/2013  . Peripheral edema 07/29/2013    Orientation RESPIRATION BLADDER Height & Weight     Self, Time, Situation, Place  Tracheostomy(28 percent. ) Incontinent Weight: (!) 326 lb 15.1 oz (148.3 kg) Height:  5\' 6"  (167.6 cm)  BEHAVIORAL SYMPTOMS/MOOD NEUROLOGICAL BOWEL NUTRITION STATUS      Continent Diet(please see discharge summary. )  AMBULATORY STATUS COMMUNICATION OF NEEDS Skin   Extensive Assist Verbally Surgical wounds(trach site.)                       Personal Care Assistance Level of Assistance  Bathing, Feeding, Dressing Bathing Assistance: Maximum assistance Feeding assistance: Maximum assistance Dressing Assistance: Maximum assistance     Functional Limitations Info  Sight, Hearing, Speech Sight Info: Adequate Hearing Info: Adequate Speech Info: Adequate(pt does have trach. )    SPECIAL CARE FACTORS FREQUENCY  PT (By licensed PT), OT (By licensed OT), Speech therapy     PT Frequency: 5 times a week OT Frequency: 5 times a week      Speech Therapy Frequency: 5 times a week       Contractures Contractures Info: Not present    Additional Factors Info  Code Status, Allergies Code Status Info: Full  Allergies Info: NKA           Current Medications (07/07/2017):  This is the current hospital active medication list Current Facility-Administered Medications  Medication Dose Route Frequency Provider Last Rate Last Dose  .  0.9 %  sodium chloride infusion  250 mL Intravenous PRN Leta Baptist, MD 10 mL/hr at 07/05/17 0600    . bisacodyl (DULCOLAX) suppository 10 mg  10 mg Rectal Daily PRN Leta Baptist, MD   10 mg at 06/15/17 1114  . chlorhexidine (PERIDEX) 0.12 % solution 15 mL  15 mL Mouth Rinse BID Dickie La, MD   15 mL at 07/06/17 2000  . Chlorhexidine Gluconate Cloth 2 % PADS 6 each  6 each Topical Daily Chesley Mires, MD   6 each at  07/07/17 0400  . clonazePAM (KLONOPIN) disintegrating tablet 0.25 mg  0.25 mg Per Tube Daily Guadalupe Dawn, MD   0.25 mg at 07/06/17 2140  . collagenase (SANTYL) ointment   Topical Daily Collene Gobble, MD      . docusate (COLACE) 50 MG/5ML liquid 100 mg  100 mg Per Tube BID Leta Baptist, MD   100 mg at 07/06/17 2144  . doxycycline (VIBRA-TABS) tablet 100 mg  100 mg Oral Q12H Guadalupe Dawn, MD   100 mg at 07/06/17 2141  . enoxaparin (LOVENOX) injection 75 mg  0.5 mg/kg Subcutaneous Q24H Dickie La, MD   75 mg at 07/06/17 1844  . famotidine (PEPCID) 40 MG/5ML suspension 20 mg  20 mg Per Tube BID Rigoberto Noel, MD   20 mg at 07/06/17 2140  . feeding supplement (PRO-STAT SUGAR FREE 64) liquid 60 mL  60 mL Per Tube BID Leta Baptist, MD   60 mL at 07/06/17 2139  . feeding supplement (VITAL HIGH PROTEIN) liquid 1,000 mL  1,000 mL Per Tube Continuous Rigoberto Noel, MD 50 mL/hr at 07/07/17 0627 1,000 mL at 07/07/17 0627  . fentaNYL (SUBLIMAZE) injection 25-100 mcg  25-100 mcg Intravenous Q2H PRN Juanito Doom, MD   100 mcg at 07/07/17 0511  . insulin aspart (novoLOG) injection 3-9 Units  3-9 Units Subcutaneous Q4H Leta Baptist, MD   3 Units at 07/07/17 0500  . insulin glargine (LANTUS) injection 10 Units  10 Units Subcutaneous QHS Leta Baptist, MD   10 Units at 07/06/17 2140  . ipratropium-albuterol (DUONEB) 0.5-2.5 (3) MG/3ML nebulizer solution 3 mL  3 mL Nebulization Q4H PRN Chesley Mires, MD      . lidocaine (XYLOCAINE) 2 % jelly 1 application  1 application Topical BID PRN Guadalupe Dawn, MD   1 application at 02/58/52 1007  . LORazepam (ATIVAN) injection 0.5 mg  0.5 mg Intravenous Q4H PRN Collene Gobble, MD   0.5 mg at 06/27/17 2146  . MEDLINE mouth rinse  15 mL Mouth Rinse q12n4p Dickie La, MD   15 mL at 07/06/17 1633  . metoprolol tartrate (LOPRESSOR) injection 5 mg  5 mg Intravenous Q6H PRN Chesley Mires, MD   5 mg at 07/01/17 0158  . multivitamin liquid 15 mL  15 mL Per Tube Daily Dickie La, MD    15 mL at 07/06/17 0907  . nystatin (MYCOSTATIN) 100000 UNIT/ML suspension 500,000 Units  5 mL Oral QID Kathrene Alu, MD   500,000 Units at 07/06/17 2140  . polyethylene glycol (MIRALAX / GLYCOLAX) packet 17 g  17 g Oral Daily PRN Leta Baptist, MD   17 g at 06/10/17 1653  . potassium chloride 20 MEQ/15ML (10%) solution 10 mEq  10 mEq Oral Daily Kathrene Alu, MD   10 mEq at 07/06/17 0908  . QUEtiapine (SEROQUEL) tablet 50 mg  50 mg Per Tube Daily Riccio, Angela C, DO   50  mg at 07/06/17 0909  . QUEtiapine (SEROQUEL) tablet 50 mg  50 mg Per Tube QHS Guadalupe Dawn, MD   50 mg at 07/06/17 2141  . RESOURCE THICKENUP CLEAR   Oral PRN Dickie La, MD      . silver nitrate applicators applicator 1 application  1 application Topical Daily PRN Hammons, Theone Murdoch, RPH      . sodium chloride flush (NS) 0.9 % injection 10-40 mL  10-40 mL Intracatheter PRN Benjamine Mola, Su, MD   10 mL at 07/06/17 2200  . sodium chloride flush (NS) 0.9 % injection 10-40 mL  10-40 mL Intracatheter Q12H Chesley Mires, MD   10 mL at 07/06/17 0904  . sodium chloride flush (NS) 0.9 % injection 10-40 mL  10-40 mL Intracatheter PRN Chesley Mires, MD   40 mL at 07/05/17 1210  . sodium chloride flush (NS) 0.9 % injection 3 mL  3 mL Intravenous Q12H Leta Baptist, MD   3 mL at 07/05/17 2209  . sodium chloride flush (NS) 0.9 % injection 3 mL  3 mL Intravenous PRN Leta Baptist, MD   3 mL at 06/13/17 2213  . torsemide (DEMADEX) tablet 10 mg  10 mg Per Tube Daily Collene Gobble, MD   10 mg at 07/06/17 0914  . vitamin C (ASCORBIC ACID) tablet 250 mg  250 mg Per Tube Daily Kathrene Alu, MD   250 mg at 07/06/17 0909     Discharge Medications: Please see discharge summary for a list of discharge medications.  Relevant Imaging Results:  Relevant Lab Results:   Additional Information SSN- [id number]. Pt had trach placed on 06/17/17 and changed on 6/12 [place] 56mm. [name] will be uncuffed.   Wetzel Bjornstad, LCSWA

## 2017-07-07 NOTE — Progress Notes (Signed)
Patient ID: JACAI KIPP, male   DOB: 03/20/1960, 57 y.o.   MRN: [id number]    20 Days Post-Op  Subjective: Pt with no complaints this morning.  On TC  Objective: Vital signs in last 24 hours: Temp:  [98.2 F (36.8 C)-99.1 F (37.3 C)] 98.4 F (36.9 C) (06/19 0726) Pulse Rate:  [88-109] 92 (06/19 0816) Resp:  [9-31] 22 (06/19 0816) BP: (95-139)/(61-103) 114/79 (06/19 0816) SpO2:  [89 %-100 %] 97 % (06/19 0816) FiO2 (%):  [28 %] 28 % (06/19 0816) Weight:  [148.3 kg (326 lb 15.1 oz)] 148.3 kg (326 lb 15.1 oz) (06/19 0444) Last BM Date: 07/04/17  Intake/Output from previous day: 06/18 0701 - 06/19 0700 In: 1616 [P.O.:236; I.V.:10; NG/GT:820] Out: 1300 [Urine:1300] Intake/Output this shift: Total I/O In: 20 [Other:20] Out: -   PE: Skin: left groin with spontaneously draining small collection.  Just lateral to this he has a chronic appearing skin fistula.  No erythema.  Lab Results:  Recent Labs    07/06/17 0544 07/07/17 0504  WBC 8.4 8.5  HGB 10.0* 9.7*  HCT 35.5* 34.8*  PLT 277 298   BMET Recent Labs    07/06/17 0544 07/07/17 0504  NA 147* 144  K 4.3 3.7  CL 102 103  CO2 35* 34*  GLUCOSE 111* 142*  BUN 43* 40*  CREATININE 1.29* 1.18  CALCIUM 9.7 9.2   PT/INR Recent Labs    07/05/17 1259  LABPROT 15.5*  INR 1.24   CMP     Component Value Date/Time   NA 144 07/07/2017 0504   K 3.7 07/07/2017 0504   CL 103 07/07/2017 0504   CO2 34 (H) 07/07/2017 0504   GLUCOSE 142 (H) 07/07/2017 0504   BUN 40 (H) 07/07/2017 0504   CREATININE 1.18 07/07/2017 0504   CALCIUM 9.2 07/07/2017 0504   PROT 7.8 06/10/2017 0347   ALBUMIN 2.1 (L) 06/23/2017 0449   AST 26 06/10/2017 0347   ALT 13 (L) 06/10/2017 0347   ALKPHOS 62 06/10/2017 0347   BILITOT 1.1 06/10/2017 0347   GFRNONAA >60 07/07/2017 0504   GFRAA >60 07/07/2017 0504   Lipase  No results found for: LIPASE     Studies/Results: Ir Gastrostomy Tube Mod Sed  Result Date: 07/05/2017 INDICATION:  Tracheostomy tube EXAM: PERC PLACEMENT GASTROSTOMY MEDICATIONS: Ancef 2 g; Antibiotics were administered within 1 hour of the procedure. Glucagon 1 mg IV ANESTHESIA/SEDATION: Versed 1.5 mg IV; Fentanyl 75 mcg IV Moderate Sedation Time:  14 minutes The patient was continuously monitored during the procedure by the interventional radiology nurse under my direct supervision. CONTRAST:  10 cc Isovue-300-administered into the gastric lumen. FLUOROSCOPY TIME:  Fluoroscopy Time: 4 minutes 30 seconds (144 mGy). COMPLICATIONS: None immediate. PROCEDURE: The procedure, risks, benefits, and alternatives were explained to the patient. Questions regarding the procedure were encouraged and answered. The patient understands and consents to the procedure. The epigastrium was prepped with Betadine in a sterile fashion, and a sterile drape was applied covering the operative field. A sterile gown and sterile gloves were used for the procedure. A 5-French orogastric tube is placed under fluoroscopic guidance. Scout imaging of the abdomen confirms barium within the transverse colon. The stomach was distended with gas. Under fluoroscopic guidance, an 18 gauge needle was utilized to puncture the anterior wall of the body of the stomach. An Amplatz wire was advanced through the needle passing a T fastener into the lumen of the stomach. The T fastener was secured for gastropexy. A  9-French sheath was inserted. A snare was advanced through the 9-French sheath. A Britta Mccreedy was advanced through the orogastric tube. It was snared then pulled out the oral cavity, pulling the snare, as well. The leading edge of the gastrostomy was attached to the snare. It was then pulled down the esophagus and out the percutaneous site. It was secured in place. Contrast was injected. The image demonstrates placement of a 20-French pull-through type gastrostomy tube into the body of the stomach. IMPRESSION: Successful 20 French pull-through gastrostomy.  Electronically Signed   By: Marybelle Killings M.D.   On: 07/05/2017 14:40   [place] Lt Lower Extrem Ltd Soft Tissue Non Vascular  Result Date: 07/06/2017 CLINICAL DATA:  History of draining wound in the left inguinal region EXAM: ULTRASOUND LEFT LOWER EXTREMITY LIMITED TECHNIQUE: Ultrasound examination of the lower extremity soft tissues was performed in the area of clinical concern. COMPARISON:  None. FINDINGS: There is an irregular hypoechoic collection beneath the skin measuring 3.2 x 0.6 x 1.9 cm. Given the clinical history this likely represents a small subcutaneous abscess. Some surrounding edema is noted. IMPRESSION: Changes consistent with a small subcutaneous abscess. Electronically Signed   By: Inez Catalina M.D.   On: 07/06/2017 12:53    Anti-infectives: Anti-infectives (From admission, onward)   Start     Dose/Rate Route Frequency Ordered Stop   07/06/17 2200  doxycycline (VIBRA-TABS) tablet 100 mg     100 mg Oral Every 12 hours 07/06/17 1755     07/05/17 0952  ceFAZolin (ANCEF) 2-4 GM/100ML-% IVPB    Note to Pharmacy:  Arlean Hopping   : cabinet override      07/05/17 0952 07/05/17 1000   07/05/17 0945  ceFAZolin (ANCEF) IVPB 2g/100 mL premix     2 g 200 mL/hr over 30 Minutes Intravenous  Once 07/05/17 0944 07/05/17 1027   07/01/17 0600  ceFAZolin (ANCEF) IVPB 2g/100 mL premix  Status:  Discontinued     2 g 200 mL/hr over 30 Minutes Intravenous To Radiology 06/30/17 1016 07/02/17 0600   06/26/17 1200  cefTRIAXone (ROCEPHIN) 1 g in sodium chloride 0.9 % 100 mL IVPB     1 g 200 mL/hr over 30 Minutes Intravenous Every 24 hours 06/26/17 1100 07/02/17 1258   06/17/17 0900  ceFAZolin (ANCEF) 3 g in dextrose 5 % 50 mL IVPB     3 g 100 mL/hr over 30 Minutes Intravenous To Surgery 06/17/17 0858 06/17/17 0902   06/10/17 2200  levofloxacin (LEVAQUIN) IVPB 750 mg  Status:  Discontinued     750 mg 100 mL/hr over 90 Minutes Intravenous Every 24 hours 06/10/17 1031 06/11/17 1535   06/09/17 2230   levofloxacin (LEVAQUIN) IVPB 750 mg  Status:  Discontinued     750 mg 100 mL/hr over 90 Minutes Intravenous Every 24 hours 06/09/17 2120 06/10/17 0104       Assessment/Plan Left groin fluid collection This collection is spontaneously draining.  No surgical I&D is required for this.  He is AF with a normal WBC.  A short course of abx therapy should be adequate.  On doxy.  This should be sufficient.  We will sign off.  No intervention planned.  Active Problems:   Acute respiratory failure (HCC)   Morbid obesity (HCC)   Respiratory distress   Respiratory failure with hypoxia and hypercapnia (HCC)   Difficult airway for intubation   Pressure injury of skin   ARDS (adult respiratory distress syndrome) (HCC)   Acute pulmonary edema (HCC)  HCAP (healthcare-associated pneumonia)   Encounter for PEG (percutaneous endoscopic gastrostomy) ([place])   LOS: 28 days    Henreitta Cea , [facility] 07/07/2017, 8:21 AM Pager: [phone]

## 2017-07-07 NOTE — Consult Note (Signed)
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  Patient evaluated by PT 07/06/17.  Please see notes for wound details. Sacral wound now being dressed by primary RNs twice daily and during one of those dressing changes, Santyl is being used.  When I peeled the existing dressing down, I found the sacral wound to be malodorous, but keep in mind this is the first time I have seen the sacral area in weeks.  At the base of the wound there is some discoloration and fibrous material, otherwise it is clean and pink.  The patient remains with a tracheostomy tube and the primary RNs are caring for the trach site per routine. Monitor the wound area(s) for worsening of condition such as: Signs/symptoms of infection,  Increase in size,  Development of or worsening of odor, Development of pain, or increased pain at the affected locations.  Notify the medical team if any of these develop.  Thank you for the consult.  Discussed plan of care with the patient and bedside nurse.  [place] nurse will not follow at this time.  Please re-consult the [place] team if needed.  Val Riles, RN, MSN, CWOCN, CNS-BC, pager [phone]

## 2017-07-07 NOTE — Progress Notes (Addendum)
Nutrition Follow-up  DOCUMENTATION CODES:   Morbid obesity  INTERVENTION:    Change TF goal rate to Vital High Protein at 60 ml/h via PEG to provide 1440 kcal, 126 gm protein, 1204 ml free water daily.  This regimen meets 72% of minimum estimated kcal needs and 87% of minimum estimated protein needs.  Patient will receive Magic cup with meals, each supplement provides 290 kcal and 9 grams of protein  NUTRITION DIAGNOSIS:   Inadequate oral intake related to inability to eat as evidenced by meal completion < 50%.  Ongoing  GOAL:   Patient will meet greater than or equal to 90% of their needs  Met with TF  MONITOR:   PO intake, Diet advancement, TF tolerance, Labs, I & O's  ASSESSMENT:   57 yo male with PMH of morbid obesity, asthma, HTN, sleep apnea, COPD, and DM who was admitted on 5/22 with acute respiratory failure, COPD exacerbation, likely new onset HF. Required intubation shortly after admission.  Discussed patient in ICU rounds and with RN today. S/P PEG on 6/17. Currently on trach collar. Receiving wet to dry dressing changes to sacral wound. Patient is currently receiving Vital High Protein via PEG at 50 ml/h (1200 ml/day) with Prostat 60 ml BID to provide 1600 kcals, 165 gm protein, 1003 ml free water daily.  Diet has been advanced to dysphagia 2 with honey thick liquids. SLP following. Patient consumed 10% of breakfast today.    Diet Order:   Diet Order           DIET DYS 2 Room service appropriate? Yes; Fluid consistency: Honey Thick  Diet effective now          EDUCATION NEEDS:   No education needs have been identified at this time  Skin:  Skin Assessment: Skin Integrity Issues: Skin Integrity Issues:: Stage II, Stage III Stage II: buttocks-midline gluteal wound Stage III: neck (medical device related PI from trach)  Last BM:  6/16 (type 6)  Height:   Ht Readings from Last 1 Encounters:  06/10/17 '5\' 6"'  (1.676 m)    Weight:   Wt Readings  from Last 1 Encounters:  07/07/17 (!) 326 lb 15.1 oz (148.3 kg)    Ideal Body Weight:  64.5 kg  BMI:  Body mass index is 52.77 kg/m.  Estimated Nutritional Needs:   Kcal:  2000-2200  Protein:  145-160 gm  Fluid:  2.2 L    Molli Barrows, RD, LDN, [place] Pager [phone] After Hours Pager [phone]

## 2017-07-07 NOTE — Progress Notes (Signed)
Family Medicine Teaching Service Daily Progress Note Intern Pager: [phone]  Patient name: Timothy Le Medical record number: [id number] Date of birth: 1960/09/26 Age: 57 y.o. Gender: male  Primary Care Provider: Kerin Perna, NP Consultants: PCCM, IR, Surgery, WOC Code Status: full  Pt Overview and Major Events to Date:  5/22 admitted to fpts-> subsequent decompensation, xfer to pccm 5/23 intubated 5/30 Tracheostomy 6/4 bedside sacral wound debridement in OR 6/17 PEG tube placement  Assessment and Plan:  Respiratory failure with hypercarbia  Obesity hypoventilation syndrome Tolerating trach collar, even able to tolerate PM valve for several hours on 6/18. Will continue to give dysphagia 2 diet and honey thick liquids. Will ask speech to re-evaluate as patient may rapidly improve swallowing function. Will wean off of g-tube feeds when tolerating better po. - Appreciate CCM recs for trach management - dysphagia II diet - passy-muir valve when able - vital signs per stepdown routine - torsemide 10mg  daily - prn metolazone for increased swelling - albuterol 2.5mg  q 2 hours prn - pepcid 20 mg bid - prostate high sugar 47mL bid, vital high protein 88mL/hr - decrease klonopin 0.5mg  daily - decrease seroquel 50mg  every morning, 50mg  seroquel at night  left groin abscess 3 x 2 x 0.6cm abscess in left groin. Evalauted by surgery. Feel the best move is to proceed with broad-spectrum abx. Started on doxycycline 100mg  bid on 6/18. - doxycycline 100mg  bid (6/18 - ) - can be given through G-Tube if starts developing trouble with po  Sacral wound S/P debridement by surgery on 6/4. Surgery and Wound care following. Beefy red, no eschar. Connective tissue still left over sacral bone but otherwise no other tissue present in wound. Per wound care recs, no more hydrotherapy indicated. Continue santyl, wet to dry dressing change BID. - Follow up wound care recs - santyl, bid wet to  drys - fentanyl 25-100 mcg q2prn for pain from wound/with wound care  Tracheostomy- Trach change on 6/12 by ENT - Trach care per routine.  - wean vent as able, t-collar during day  - CCM following for trach care & vent needs - Likely trach size change around 6/26  Anemia- stable Likely due to a combination of blood loss from sacral wound, trach, and anemia of chronic disease.  -Transfuse if hgb falls below 7.  FEN/GI: Vital high protein liquid 75mL/hr, sugar free pro-state 80mL bid PPx: pepcid, lovenox  Disposition: pending peg tube placement, trach/vent needs  Subjective:  Receiving hydrotherapy, tolerated well  Objective: Temp:  [98.2 F (36.8 C)-99.3 F (37.4 C)] 99.3 F (37.4 C) (06/19 1149) Pulse Rate:  [88-117] 112 (06/19 1138) Resp:  [9-31] 28 (06/19 1138) BP: (97-139)/(62-103) 106/74 (06/19 1100) SpO2:  [87 %-99 %] 92 % (06/19 1138) FiO2 (%):  [28 %-35 %] 35 % (06/19 1138) Weight:  [326 lb 15.1 oz (148.3 kg)] 326 lb 15.1 oz (148.3 kg) (06/19 0444) Physical Exam: Gen: morbidly obese African American male resting comfortably in bed, tolerating trach collar well Heart:rrr, no m/r/g Lungs: Exam limited by body habitus. Coarse rhonchi continue to improve over time. Normal work of breathing on trach collar. Abdomen: obese, NT, ND, Soft. Non-distended. Extremities: no edema or cyanosis Neuro: tracks, follows commands, moves all extremities  Sacrum: stage 4 sacral decubitus ulcer present Left groin: indurated area draining bloody, purulent fluid  Laboratory: Recent Labs  Lab 07/05/17 0412 07/06/17 0544 07/07/17 0504  WBC 8.3 8.4 8.5  HGB 9.9* 10.0* 9.7*  HCT 35.6* 35.5* 34.8*  PLT  321 277 298   Recent Labs  Lab 07/05/17 0412 07/06/17 0544 07/07/17 0504  NA 145 147* 144  K 3.7 4.3 3.7  CL 98* 102 103  CO2 36* 35* 34*  BUN 42* 43* 40*  CREATININE 1.15 1.29* 1.18  CALCIUM 9.7 9.7 9.2  GLUCOSE 124* 111* 142*    Imaging/Diagnostic Tests:  CT ABDOMEN  WITHOUT CONTRAST IMPRESSION: No acute CT finding of the abdomen. Likely biliary sludge/microlithiasis without evidence of acute inflammation. Post pyloric tube terminates within the first portion the duodenum. 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 Atelectasis/scarring at the lung bases.  Guadalupe Dawn, MD 07/07/2017, 11:58 AM PGY-1, [place] Intern pager: [phone], text pages welcome

## 2017-07-07 NOTE — Progress Notes (Signed)
CSW spoke with pt at bedside. Pt expressed being agreeable to rehab at the time of discharge. CSW will continue to follow for disposition needs at this time and to began the process of SNF search once pt is medically stable.    Virgie Dad Priyansh Pry, MSW, [place] [facility] Clinical Social Worker [phone]

## 2017-07-07 NOTE — Progress Notes (Signed)
Spoke with Nate RN regarding patient drsg. Day nurse [facility] changed drsg before shift change, but drsg is loose again. Instructed Nate to use Mastisol and notify [facility] with further concerns. VU. Fran Lowes, RN [facility]

## 2017-07-08 DIAGNOSIS — I10 Essential (primary) hypertension: Secondary | ICD-10-CM

## 2017-07-08 DIAGNOSIS — L02214 Cutaneous abscess of groin: Secondary | ICD-10-CM

## 2017-07-08 LAB — CBC WITH DIFFERENTIAL/PLATELET
Abs Immature Granulocytes: 0.1 10*3/uL (ref 0.0–0.1)
BASOS ABS: 0 10*3/uL (ref 0.0–0.1)
Basophils Relative: 0 %
EOS PCT: 2 %
Eosinophils Absolute: 0.2 10*3/uL (ref 0.0–0.7)
HCT: 34.5 % — ABNORMAL LOW (ref 39.0–52.0)
HEMOGLOBIN: 9.7 g/dL — AB (ref 13.0–17.0)
Immature Granulocytes: 1 %
LYMPHS PCT: 28 %
Lymphs Abs: 2.6 10*3/uL (ref 0.7–4.0)
MCH: 25.5 pg — AB (ref 26.0–34.0)
MCHC: 28.1 g/dL — AB (ref 30.0–36.0)
MCV: 90.8 fL (ref 78.0–100.0)
MONO ABS: 0.8 10*3/uL (ref 0.1–1.0)
Monocytes Relative: 9 %
Neutro Abs: 5.6 10*3/uL (ref 1.7–7.7)
Neutrophils Relative %: 60 %
Platelets: 268 10*3/uL (ref 150–400)
RBC: 3.8 MIL/uL — ABNORMAL LOW (ref 4.22–5.81)
RDW: 17.7 % — ABNORMAL HIGH (ref 11.5–15.5)
WBC: 9.4 10*3/uL (ref 4.0–10.5)

## 2017-07-08 LAB — BASIC METABOLIC PANEL
Anion gap: 7 (ref 5–15)
BUN: 32 mg/dL — AB (ref 6–20)
CHLORIDE: 102 mmol/L (ref 101–111)
CO2: 33 mmol/L — AB (ref 22–32)
CREATININE: 1.07 mg/dL (ref 0.61–1.24)
Calcium: 9.2 mg/dL (ref 8.9–10.3)
GFR calc Af Amer: >60 mL/min (ref >60)
GFR calc non Af Amer: >60 mL/min (ref >60)
Glucose, Bld: 148 mg/dL — ABNORMAL HIGH (ref 65–99)
Potassium: 3.7 mmol/L (ref 3.5–5.1)
SODIUM: 142 mmol/L (ref 135–145)

## 2017-07-08 LAB — GLUCOSE, CAPILLARY
GLUCOSE-CAPILLARY: 139 mg/dL — AB (ref 65–99)
GLUCOSE-CAPILLARY: 156 mg/dL — AB (ref 65–99)
Glucose-Capillary: 115 mg/dL — ABNORMAL HIGH (ref 65–99)
Glucose-Capillary: 127 mg/dL — ABNORMAL HIGH (ref 65–99)
Glucose-Capillary: 131 mg/dL — ABNORMAL HIGH (ref 65–99)
Glucose-Capillary: 133 mg/dL — ABNORMAL HIGH (ref 65–99)
Glucose-Capillary: 141 mg/dL — ABNORMAL HIGH (ref 65–99)

## 2017-07-08 MED ORDER — AQUAPHOR EX OINT
TOPICAL_OINTMENT | Freq: Two times a day (BID) | CUTANEOUS | Status: DC
  Administered 2017-07-08 – 2017-07-15 (×11): via TOPICAL
  Filled 2017-07-08 (×2): qty 50

## 2017-07-08 MED ORDER — VITAL HIGH PROTEIN PO LIQD
1000.0000 mL | ORAL | Status: DC
  Administered 2017-07-09 – 2017-07-13 (×4): 1000 mL
  Filled 2017-07-08 (×9): qty 1000

## 2017-07-08 MED ORDER — VITAL HIGH PROTEIN PO LIQD
1000.0000 mL | ORAL | Status: DC
  Filled 2017-07-08 (×2): qty 1000

## 2017-07-08 MED ORDER — VITAL HIGH PROTEIN PO LIQD: 1000.0000 mL | ORAL | Status: DC

## 2017-07-08 NOTE — Progress Notes (Signed)
Family Medicine Teaching Service Daily Progress Note Intern Pager: [phone]  Patient name: ANTHONEE GELIN Medical record number: [id number] Date of birth: [birth year] Age: 57 y.o. Gender: male  Primary Care Provider: Kerin Perna, NP Consultants: PCCM, IR, Surgery, WOC Code Status: full  Pt Overview and Major Events to Date:  5/22 admitted to fpts-> subsequent decompensation, xfer to pccm 5/23 intubated 5/30 Tracheostomy 6/4 bedside sacral wound debridement in OR 6/17 PEG tube placement  Assessment and Plan:  Respiratory failure with hypercarbia  Obesity hypoventilation syndrome Has tolerated trach collar well for 5 days. Appreciate CCM recs. Will get AM abg to see if he even needs nocturnal vent. Still dysphagia II and honey thick diet. Weaning down from tube feed now that tolerating better po. 93mL/hr vital high protein on 6/19. Will decrease to 60mL/hr 6/20. - Appreciate CCM recs for trach management - dysphagia II diet, honey thick liquids - passy-muir valve when able - vital signs per stepdown routine - torsemide 10mg  daily - prn metolazone for increased swelling - albuterol 2.5mg  q 2 hours prn - pepcid 20 mg bid - vital high protein 107mL/hr - decrease klonopin 0.5mg  daily - stopping am seroquel, 50mg  seroquel at night  left groin abscess 3 x 2 x 0.6cm abscess in left groin. Evalauted by surgery. Feel the best move is to proceed with broad-spectrum abx. Started on doxycycline 100mg  bid on 6/18. Seems to be improving, will need wicking agent to help keep area dry. - doxycycline 100mg  bid (6/18 - ) - can be given through G-Tube if starts developing trouble with po  Sacral wound S/P debridement by surgery on 6/4. Surgery and Wound care following. Beefy red, no eschar. Connective tissue still left over sacral bone but otherwise no other tissue present in wound. Per wound care recs, no more hydrotherapy indicated. Continue santyl, wet to dry dressing change BID. -  Follow up wound care recs - santyl, bid wet to drys - fentanyl 25-100 mcg q2prn for pain from wound/with wound care  Tracheostomy- Trach change on 6/12 by ENT - Trach care per routine.  - wean vent as able, t-collar during day  - CCM following for trach care & vent needs - Likely trach size change around 6/26  Anemia- stable Likely due to a combination of blood loss from sacral wound, trach, and anemia of chronic disease.  -Transfuse if hgb falls below 7.  FEN/GI: Vital high protein liquid 44mL/hr, sugar free pro-state 76mL bid PPx: pepcid, lovenox  Disposition: pending peg tube placement, trach/vent needs  Subjective:  Tolerated move out of icu well. Will need  Objective: Temp:  [98.7 F (37.1 C)-99.7 F (37.6 C)] 99.1 F (37.3 C) (06/20 0735) Pulse Rate:  [90-112] 100 (06/20 0900) Resp:  [15-33] 28 (06/20 0900) BP: (113-137)/(75-126) 136/85 (06/20 0735) SpO2:  [91 %-99 %] 97 % (06/20 0900) FiO2 (%):  [28 %-35 %] 28 % (06/20 0900) Weight:  [332 lb 0.2 oz (150.6 kg)] 332 lb 0.2 oz (150.6 kg) (06/20 0412) Physical Exam: Gen: morbidly obese AA male resting comfortably in bed, tolerating trach collar well Heart: regular rate rhythm, no murmurs/rubs/gallops Lungs: Exam limited by body habitus. Coarse rhonchi continue to improve over time. Normal work of breathing on trach collar. Abdomen: obese, NT, ND, Soft. Non-distended. Extremities: no edema or cyanosis Neuro: tracks, follows commands, moves all extremities  Sacrum: stage 4 sacral decubitus ulcer present Left groin: improving induration in left groin  Laboratory: Recent Labs  Lab 07/06/17 0544 07/07/17 0504 07/08/17  0533  WBC 8.4 8.5 9.4  HGB 10.0* 9.7* 9.7*  HCT 35.5* 34.8* 34.5*  PLT 277 298 268   Recent Labs  Lab 07/06/17 0544 07/07/17 0504 07/08/17 0533  NA 147* 144 142  K 4.3 3.7 3.7  CL 102 103 102  CO2 35* 34* 33*  BUN 43* 40* 32*  CREATININE 1.29* 1.18 1.07  CALCIUM 9.7 9.2 9.2  GLUCOSE 111*  142* 148*    Imaging/Diagnostic Tests:  CT ABDOMEN WITHOUT CONTRAST IMPRESSION: No acute CT finding of the abdomen. Likely biliary sludge/microlithiasis without evidence of acute inflammation. Post pyloric tube terminates within the first portion the duodenum. 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 Atelectasis/scarring at the lung bases.  Guadalupe Dawn, MD 07/08/2017, 11:35 AM PGY-1, [place] Intern pager: [phone], text pages welcome

## 2017-07-08 NOTE — Progress Notes (Signed)
PULMONARY / CRITICAL CARE MEDICINE   Name: Timothy Le MRN: [id number] DOB: 1960-10-07    ADMISSION DATE:  06/09/2017  CHIEF COMPLAINT:  Dyspnea  HISTORY OF PRESENT ILLNESS:   57 yo male with altered mental status from acute on chronic hypoxic/hypercapnic respiratory failure, CHF exacerbation.  Required intubation.  Failed to wean and required tracheostomy.  SUBJECTIVE:  Has been on trach collar since 07/03/2017.  Was supposed to be on nocturnal ventilation but this is not been utilized.  We will check an ABG 0 500 on 07/09/2017 to confirm or deny his need for nocturnal ventilation  VITAL SIGNS: BP 136/85 (BP Location: Left Wrist)   Pulse 90   Temp 99.1 F (37.3 C) (Oral)   Resp (!) 30   Ht 5\' 6"  (1.676 m)   Wt (!) 150.6 kg (332 lb 0.2 oz)   SpO2 95%   BMI 53.59 kg/m   VENTILATOR SETTINGS: FiO2 (%):  [28 %-35 %] 28 %  INTAKE / OUTPUT: I/O last 3 completed shifts: In: 1793.7 [P.O.:360; I.V.:10; Other:350; NG/GT:1073.7] Out: 970 [Urine:970]  PHYSICAL EXAMINATION:  General: Morbidly obese male who appears sleepy and groggy HEENT: Cuffed trach in place without drainage Neuro: Sleepy difficult to arouse CV: Heart sounds distant PULM: Decreased breath sounds at base SV:XBLT, non-tender, bsx4 active  Extremities: warm/dry, 2+ edema  Skin: Groin wound scant drainage, sacral decubitus dressing in place   LABS:  BMET Recent Labs  Lab 07/06/17 0544 07/07/17 0504 07/08/17 0533  NA 147* 144 142  K 4.3 3.7 3.7  CL 102 103 102  CO2 35* 34* 33*  BUN 43* 40* 32*  CREATININE 1.29* 1.18 1.07  GLUCOSE 111* 142* 148*    Electrolytes Recent Labs  Lab 07/06/17 0544 07/07/17 0504 07/08/17 0533  CALCIUM 9.7 9.2 9.2    CBC Recent Labs  Lab 07/06/17 0544 07/07/17 0504 07/08/17 0533  WBC 8.4 8.5 9.4  HGB 10.0* 9.7* 9.7*  HCT 35.5* 34.8* 34.5*  PLT 277 298 268    Coag's Recent Labs  Lab 07/05/17 1259  APTT 33  INR 1.24    Sepsis Markers No results  for input(s): LATICACIDVEN, PROCALCITON, O2SATVEN in the last 168 hours.  ABG No results for input(s): PHART, PCO2ART, PO2ART in the last 168 hours.  Liver Enzymes No results for input(s): AST, ALT, ALKPHOS, BILITOT, ALBUMIN in the last 168 hours.  Cardiac Enzymes No results for input(s): TROPONINI, PROBNP in the last 168 hours.  Glucose Recent Labs  Lab 07/07/17 1142 07/07/17 1459 07/07/17 2030 07/08/17 0039 07/08/17 0537 07/08/17 0738  GLUCAP 132* 122* 134* 133* 141* 131*    Imaging No results found.   STUDIES:  07/31/16 PFT > no airflow obstruction, severe restriction. 06/10/17 Echo> LVEF 55-60%, otherwise poor visualization  CULTURES: Sacral wound 6/04 >> E coli Sputum 6/04 >> Corynebacterium  ANTIBIOTICS: 5/22 levaquin > 5/24 6/08 Rocephin > off 07/06/2017 to doxycycline>>  LINES/TUBES: 5/23 ETT > 5/30 5/28 RUE PICC >  5/30 tracheostomy >    ASSESSMENT / PLAN:  Acute on chronic hypoxic/hypercapnic respiratory failure with OSA/OHS, morbid obesity. Tracheostomy status. PLAN -  currently has been on trach collar 24/7 since 07/05/2017.  He appears sleepy in a.m. was noted to be on decreasing doses of Seroquel and Klonopin. 07/09/2017 for completeness we will check an arterial blood gas to make sure he is not hypercarbic. Would not change until uncuffed trach at this time as he may need nocturnal ventilation due to his body habitus.  HFpEF.  Intake/Output Summary (Last 24 hours) at 07/08/2017 0820 Last data filed at 07/08/2017 0400 Gross per 24 hour  Intake 1343.67 ml  Output 220 ml  Net 1123.67 ml    PLAN  Attempt negative fluid balance but noticed positive   Stage IV sacral wound. PLAN  Per primary care team and central general surgery  Draining groin wound He has been evaluated by surgery no surgical intervention is needed currently on antibiotics.    Richardson Landry Minor ACNP Maryanna Shape PCCM Pager [phone] till 1 pm If no answer page [phone] 07/08/2017, 8:18 AM

## 2017-07-09 ENCOUNTER — Encounter (HOSPITAL_COMMUNITY): Payer: Self-pay | Admitting: Family Medicine

## 2017-07-09 DIAGNOSIS — J9611 Chronic respiratory failure with hypoxia: Secondary | ICD-10-CM

## 2017-07-09 HISTORY — DX: Chronic respiratory failure with hypoxia: J96.11

## 2017-07-09 LAB — BLOOD GAS, ARTERIAL
Acid-Base Excess: 8.6 mmol/L — ABNORMAL HIGH (ref 0.0–2.0)
Bicarbonate: 32.9 mmol/L — ABNORMAL HIGH (ref 20.0–28.0)
DRAWN BY: 51191
FIO2: 28
O2 CONTENT: 5 L/min
O2 Saturation: 93.9 %
PH ART: 7.447 (ref 7.350–7.450)
PO2 ART: 77.1 mmHg — AB (ref 83.0–108.0)
Patient temperature: 98.6
pCO2 arterial: 48.5 mmHg — ABNORMAL HIGH (ref 32.0–48.0)

## 2017-07-09 LAB — GLUCOSE, CAPILLARY
Glucose-Capillary: 119 mg/dL — ABNORMAL HIGH (ref 65–99)
Glucose-Capillary: 137 mg/dL — ABNORMAL HIGH (ref 65–99)
Glucose-Capillary: 130 mg/dL — ABNORMAL HIGH (ref 65–99)
Glucose-Capillary: 122 mg/dL — ABNORMAL HIGH (ref 65–99)

## 2017-07-09 NOTE — Progress Notes (Signed)
  Speech Language Pathology Treatment: Dysphagia;Passy Muir Speaking valve  Patient Details Name: Timothy Le MRN: [id number] DOB: 21-Aug-1960 Today's Date: 07/09/2017 Time: 3875-6433 SLP Time Calculation (min) (ACUTE ONLY): 20 min  Assessment / Plan / Recommendation Clinical Impression  Pt agreeable to PMV placement and trials of honey-thickened liquids. Pt tolerated placement of PMV for ~15 minutes with periods of aphonia paired with normal phonation and increased vocal intensity during simple phrases/sentences, suspect a combination of positioning, breath support, and behavior. After 15 minutes pt c/o SOB and RR increased to 37 but returned to baseline when PMV was removed. Mild back pressure was noted suggestive of decreased upper airway patency. Suspect he will need a smaller and/or cuffless trach. Pt required min-mod verbal cues for rate of intake and allowed SLP to assist with volume of liquids during PO trial stating "I am very thirsty", even becoming tearful by end of session; overt s/s of aspiration noted with immediate coughing intermittently during 2-3 oz consumption of honey-thickened liquids, which improved with small sips provided by SLP and HOB elevated as high as tolerated (>45 degrees) with head neutral during trial. Discussed plan with pt for repeat FEES early next week if pt's aspiration symptoms decrease. Pt continues to be at moderate risk for aspiration without swallowing precautions in place d/t impulsivity and positioning d/t sacral wound. Recommend to continue Dys 2 diet, honey thick liquids with full supervision. Would continue PMV with full staff supervision as well. Would allow a few, single ice chips in between meals and after oral care - also only with full staff supervision.   HPI HPI: 57 yo male admitted with respiratory distress. Intubated 5/23, trach 5/30 . PMH includes morbid obesity, COPD, asthma, OSA, HTN, DM. PEG placed 6/17       SLP Plan  Continue with  current plan of care;Other (Comment)(repeat FEES when pt able/ready)       Recommendations  Diet recommendations: Dysphagia 2 (fine chop);Honey-thick liquid Liquids provided via: Cup Medication Administration: Whole meds with puree Supervision: Patient able to self feed;Full supervision/cueing for compensatory strategies Compensations: Slow rate;Small sips/bites Postural Changes and/or Swallow Maneuvers: Seated upright 90 degrees;Upright 30-60 min after meal      Patient may use Passy-Muir Speech Valve: Intermittently with supervision PMSV Supervision: Full MD: Please consider changing trach tube to : Cuffless;Smaller size         Oral Care Recommendations: Oral care BID Follow up Recommendations: Other (TBD) SLP Visit Diagnosis: Dysphagia, oropharyngeal phase (R13.12) Plan: Continue with current plan of care;Other (Comment)(repeat FEES when pt able/ready)                       Elvina Sidle, M.S., CCC-SLP 07/09/2017, 12:13 PM

## 2017-07-09 NOTE — Progress Notes (Signed)
Family Medicine Teaching Service Daily Progress Note Intern Pager: [phone]  Patient name: Timothy Le Medical record number: [id number] Date of birth: 1960-12-03 Age: 57 y.o. Gender: male  Primary Care Provider: Kerin Perna, NP Consultants: PCCM, IR, Surgery, WOC Code Status: full  Pt Overview and Major Events to Date:  5/22 admitted to fpts-> subsequent decompensation, xfer to pccm 5/23 intubated 5/30 Tracheostomy 6/4 bedside sacral wound debridement in OR 6/17 PEG tube placement  Assessment and Plan:  Respiratory failure with hypercarbia  Obesity hypoventilation syndrome ABG from this am 7.45/48/77/33 consistent with metabolic alkalosis, favor due to volume contraction. Will follow up ccm recommendations, could potentially switch to cuffless trach. Continue dysphagia II diet. Will stop klonopin 6/21. Can likely decrease seroquel dose to 25mg  on 6/22 and stop on 6/23. Weaning tube feeds when able. - Appreciate CCM recs for trach management - dysphagia II diet, honey thick liquids - passy-muir valve when able - vital signs per stepdown routine - torsemide 10mg  daily - prn metolazone for increased swelling - albuterol 2.5mg  q 2 hours prn - pepcid 20 mg bid - vital high protein 76mL/hr - stopping klonopin 0.5mg  daily - likely 25mg  seroquel on 6/22, stop on 6/23  left groin abscess 3 x 2 x 0.6cm abscess in left groin. Evaluated by surgery who recommended 10-14 po antibiotics. Felt doxy gave best coverage of gram negatives, anaerobes, and mrsa. Continue to get wicking agent to keep area dry. - doxycycline 100mg  bid (6/18-) - If starts to develop trouble with PO, can crush and give through PEG - towel or wicking agent to keep area dry  Sacral wound S/P debridement by surgery on 6/4. Surgery and Wound care signed off.. Beefy red, no eschar. Connective tissue still left over sacral bone but otherwise no other tissue present in wound. Per wound care recs, no more  hydrotherapy indicated. Continue santyl, wet to dry dressing change BID. - Follow up wound care recs - santyl, bid wet to drys - fentanyl 25-100 mcg q2prn for pain from wound/with wound care - continue vitamin C to promote wound healing  Tracheostomy- Trach change on 6/12 by ENT - Trach care per routine.  - wean vent as able, t-collar during day  - CCM following for trach care & vent needs - Likely trach size change around 6/26  Anemia- stable Likely due to a combination of blood loss from sacral wound, trach, and anemia of chronic disease.  -Transfuse if hgb falls below 7.  FEN/GI: Vital high protein liquid 18mL/hr, sugar free pro-state 58mL bid, po as tolerated PPx: pepcid, lovenox  Disposition: pending peg tube placement, trach/vent needs  Subjective:  Doing well this morning. Does not like honey thickened liquids.  Objective: Temp:  [98.3 F (36.8 C)-99.8 F (37.7 C)] 99.2 F (37.3 C) (06/21 0756) Pulse Rate:  [79-104] 96 (06/21 1108) Resp:  [16-24] 16 (06/21 1108) BP: (108-141)/(69-122) 126/71 (06/21 0820) SpO2:  [95 %-99 %] 98 % (06/21 1108) FiO2 (%):  [28 %] 28 % (06/21 1108) Physical Exam: Gen: morbidly obese African American male resting comfortably in bed, no acute distress Heart: rrr, no m/r/g Lungs: Exam limited by body habitus. Could not appreciate coarse rhonchi this am. Abdomen: obese, NT, ND, Soft. Non-distended. Extremities: no edema or cyanosis Neuro: tracks, follows commands, moves all extremities  Sacrum: stage 4 sacral decubitus ulcer present Left groin: continued improved induration, unable to express pus out of this area  Laboratory: Recent Labs  Lab 07/06/17 0544 07/07/17 0504 07/08/17 0533  WBC 8.4 8.5 9.4  HGB 10.0* 9.7* 9.7*  HCT 35.5* 34.8* 34.5*  PLT 277 298 268   Recent Labs  Lab 07/06/17 0544 07/07/17 0504 07/08/17 0533  NA 147* 144 142  K 4.3 3.7 3.7  CL 102 103 102  CO2 35* 34* 33*  BUN 43* 40* 32*  CREATININE 1.29* 1.18  1.07  CALCIUM 9.7 9.2 9.2  GLUCOSE 111* 142* 148*    Imaging/Diagnostic Tests:  CT ABDOMEN WITHOUT CONTRAST IMPRESSION: No acute CT finding of the abdomen. Likely biliary sludge/microlithiasis without evidence of acute inflammation. Post pyloric tube terminates within the first portion the duodenum. 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 Atelectasis/scarring at the lung bases.  Guadalupe Dawn, MD 07/09/2017, 11:30 AM PGY-1, [place] Intern pager: [phone], text pages welcome

## 2017-07-09 NOTE — Procedures (Signed)
Tracheostomy tube change: Informed verbal consent was obtained after explaining the risks (including bleeding and infection), benefits and alternatives of the procedure. Verbal timeout was performed prior to the procedure. The old  # 7 proximal XLT cuffed trach was carefully removed. the tracheostomy site appeared: unremarkable. A new # 6 Cuffless prox xlt trach was easily placed in the tracheostomy stoma and secured with velcro trach ties. The tracheostomy was patent, good color change observed via EZ-CAP, and the patient was easily able to voice with finger occlusion and tolerated the procedure well with no immediate complications.  ->the patient tolerated this well.  Erick Colace ACNP-BC [place] Pager # [phone] OR # [phone] if no answer

## 2017-07-09 NOTE — Progress Notes (Signed)
PULMONARY / CRITICAL CARE MEDICINE   Name: Timothy Le MRN: [id number] DOB: 1960-06-24    ADMISSION DATE:  06/09/2017  CHIEF COMPLAINT:  Dyspnea  HISTORY OF PRESENT ILLNESS:   57 yo male with altered mental status from acute on chronic hypoxic/hypercapnic respiratory failure, CHF exacerbation.  Required intubation.  Failed to wean and required tracheostomy.  SUBJECTIVE:  Feels well, no distress.  VITAL SIGNS: Blood Pressure 126/71   Pulse 96   Temperature 99.2 F (37.3 C) (Oral)   Respiration 16   Height 5\' 6"  (1.676 m)   Weight (Abnormal) 332 lb 0.2 oz (150.6 kg)   Oxygen Saturation 98%   Body Mass Index 53.59 kg/m    VENTILATOR SETTINGS: FiO2 (%):  [28 %] 28 %  INTAKE / OUTPUT: I/O last 3 completed shifts: In: 66 [P.O.:120; NG/GT:900] Out: 1175 [Urine:1175]  PHYSICAL EXAMINATION: General: This is a 57 year old morbidly obese male currently resting in bed he is in no acute distress.  He is just been suctioned by nursing staff. HEENT neck is large mucous membranes moist he is in dentulous.  He has a size 7 proximal XLT in place.  He is able to phonate with Passy-Muir valve but does feel short of breath. Pulmonary scattered rhonchi that improve with suction diminished bases Cardiac: Regular rate and rhythm Abdomen: Obese Extremity: Warm and dry chronic venous stasis changes Neuro: Awake oriented and appropriate   LABS:  BMET Recent Labs  Lab 07/06/17 0544 07/07/17 0504 07/08/17 0533  NA 147* 144 142  K 4.3 3.7 3.7  CL 102 103 102  CO2 35* 34* 33*  BUN 43* 40* 32*  CREATININE 1.29* 1.18 1.07  GLUCOSE 111* 142* 148*    Electrolytes Recent Labs  Lab 07/06/17 0544 07/07/17 0504 07/08/17 0533  CALCIUM 9.7 9.2 9.2    CBC Recent Labs  Lab 07/06/17 0544 07/07/17 0504 07/08/17 0533  WBC 8.4 8.5 9.4  HGB 10.0* 9.7* 9.7*  HCT 35.5* 34.8* 34.5*  PLT 277 298 268    Coag's Recent Labs  Lab 07/05/17 1259  APTT 33  INR 1.24    Sepsis  Markers No results for input(s): LATICACIDVEN, PROCALCITON, O2SATVEN in the last 168 hours.  ABG Recent Labs  Lab 07/09/17 0440  PHART 7.447  PCO2ART 48.5*  PO2ART 77.1*    Liver Enzymes No results for input(s): AST, ALT, ALKPHOS, BILITOT, ALBUMIN in the last 168 hours.  Cardiac Enzymes No results for input(s): TROPONINI, PROBNP in the last 168 hours.  Glucose Recent Labs  Lab 07/08/17 0738 07/08/17 1159 07/08/17 1617 07/08/17 1941 07/08/17 2323 07/09/17 0750  GLUCAP 131* 127* 139* 115* 156* 119*    Imaging No results found.   STUDIES:  07/31/16 PFT > no airflow obstruction, severe restriction. 06/10/17 Echo> LVEF 55-60%, otherwise poor visualization  CULTURES: Sacral wound 6/04 >> E coli Sputum 6/04 >> Corynebacterium  ANTIBIOTICS: 5/22 levaquin > 5/24 6/08 Rocephin > off 07/06/2017 to doxycycline>>  LINES/TUBES: 5/23 ETT > 5/30 5/28 RUE PICC >  5/30 tracheostomy >    ASSESSMENT / PLAN:  Acute on chronic hypoxic/hypercapnic respiratory failure with OSA/OHS, morbid obesity. Tracheostomy status. HFpEF. Stage IV sacral wound. Draining groin wound Dysphasia  Discussion: Now status post prolonged critical illness and ventilator dependence following acute on chronic respiratory failure for decompensated diastolic heart failure & Volume overload, in the setting of untreated sleep apnea.  He tells me he has had 2 sleep studies, also has 2 CPAP machines at home neither of which he  uses.  Arterial blood gas reviewed.  He is slightly hypercarbic but I do not think with how awake he is and given compliance issues investigating nocturnal ventilation makes sense especially because he has 2 CPAP machines already at home which he does not use  Plan I will change his tracheostomy to a size 6 proximal XLT trach, I suspect this will assist him with Passy-Muir valve trials He will need follow-up in trach clinic I will place this in the computer Continue aspiration  precautions and dysphasia diet Continue diuresis as tolerated Continue to wean sedation Continue routine tracheostomy care   We will see again next week   Erick Colace ACNP-BC [place] Pager # [phone] OR # [phone] if no answer

## 2017-07-10 DIAGNOSIS — E1165 Type 2 diabetes mellitus with hyperglycemia: Secondary | ICD-10-CM

## 2017-07-10 LAB — BASIC METABOLIC PANEL
Anion gap: 10 (ref 5–15)
BUN: 22 mg/dL — ABNORMAL HIGH (ref 6–20)
CHLORIDE: 96 mmol/L — AB (ref 101–111)
CO2: 32 mmol/L (ref 22–32)
Calcium: 9 mg/dL (ref 8.9–10.3)
Creatinine, Ser: 0.94 mg/dL (ref 0.61–1.24)
GFR calc Af Amer: >60 mL/min (ref >60)
GFR calc non Af Amer: >60 mL/min (ref >60)
Glucose, Bld: 117 mg/dL — ABNORMAL HIGH (ref 65–99)
POTASSIUM: 3.9 mmol/L (ref 3.5–5.1)
SODIUM: 138 mmol/L (ref 135–145)

## 2017-07-10 LAB — CBC WITH DIFFERENTIAL/PLATELET
ABS IMMATURE GRANULOCYTES: 0.1 10*3/uL (ref 0.0–0.1)
BASOS ABS: 0 10*3/uL (ref 0.0–0.1)
Basophils Relative: 0 %
EOS PCT: 4 %
Eosinophils Absolute: 0.3 10*3/uL (ref 0.0–0.7)
HEMATOCRIT: 32.4 % — AB (ref 39.0–52.0)
HEMOGLOBIN: 9.1 g/dL — AB (ref 13.0–17.0)
Immature Granulocytes: 1 %
LYMPHS ABS: 2.3 10*3/uL (ref 0.7–4.0)
LYMPHS PCT: 31 %
MCH: 25.3 pg — ABNORMAL LOW (ref 26.0–34.0)
MCHC: 28.1 g/dL — ABNORMAL LOW (ref 30.0–36.0)
MCV: 90.3 fL (ref 78.0–100.0)
MONOS PCT: 10 %
Monocytes Absolute: 0.7 10*3/uL (ref 0.1–1.0)
NEUTROS ABS: 4 10*3/uL (ref 1.7–7.7)
Neutrophils Relative %: 54 %
Platelets: 233 10*3/uL (ref 150–400)
RBC: 3.59 MIL/uL — ABNORMAL LOW (ref 4.22–5.81)
RDW: 17.4 % — ABNORMAL HIGH (ref 11.5–15.5)
WBC: 7.4 10*3/uL (ref 4.0–10.5)

## 2017-07-10 LAB — GLUCOSE, CAPILLARY
GLUCOSE-CAPILLARY: 109 mg/dL — AB (ref 65–99)
GLUCOSE-CAPILLARY: 110 mg/dL — AB (ref 65–99)
GLUCOSE-CAPILLARY: 127 mg/dL — AB (ref 65–99)
Glucose-Capillary: 133 mg/dL — ABNORMAL HIGH (ref 65–99)

## 2017-07-10 MED ORDER — QUETIAPINE FUMARATE 25 MG PO TABS
25.0000 mg | ORAL_TABLET | Freq: Every day | ORAL | Status: DC
  Administered 2017-07-10: 25 mg
  Filled 2017-07-10: qty 1

## 2017-07-10 NOTE — Progress Notes (Signed)
Family Medicine Teaching Service Daily Progress Note Intern Pager: [phone]  Patient name: Timothy Le Medical record number: [id number] Date of birth: 1960/09/14 Age: 57 y.o. Gender: male  Primary Care Provider: Kerin Perna, NP Consultants: PCCM, IR, Surgery, WOC Code Status: full  Pt Overview and Major Events to Date:  5/22 admitted to fpts-> subsequent decompensation, xfer to pccm 5/23 intubated 5/30 Tracheostomy 6/4 bedside sacral wound debridement in OR 6/17 PEG tube placement 6/21 Cuffless trach tube  Assessment and Plan:  Respiratory failure with hypercarbia  Obesity hypoventilation syndrome Trach tube changed on 6/21 to cuffless trach #6 without any complications. Continue dysphagia II diet. Klonopin stopped on 6/21. Seroquel decrease to 25 mg. Continue to wean tube feeds as patient take more po. - Appreciate CCM recs for trach management - dysphagia II diet, honey thick liquids - passy-muir valve when able - vital signs per stepdown routine - torsemide 10mg  daily - prn metolazone for increased swelling - albuterol 2.5mg  q 2 hours prn - pepcid 20 mg bid - vital high protein 51mL/hr - stopping klonopin 0.5mg  daily - likely 25mg  seroquel on 6/22, stop on 6/23  left groin abscess 3 x 2 x 0.6cm abscess in left groin. Evaluated by surgery who recommended 10-14 po antibiotics. Felt doxy gave best coverage of gram negatives, anaerobes, and mrsa. Continue to get wicking agent to keep area dry. - doxycycline 100mg  bid (6/18-) - If starts to develop trouble with PO, can crush and give through PEG - towel or wicking agent to keep area dry  Sacral wound S/P debridement by surgery on 6/4. Surgery and Wound care signed off.. Beefy red, no eschar. Connective tissue still left over sacral bone but otherwise no other tissue present in wound. Per wound care recs, no more hydrotherapy indicated. Continue santyl, wet to dry dressing change BID. - Follow up wound care  recs - santyl, bid wet to drys - fentanyl 25-100 mcg q2prn for pain from wound/with wound care - continue vitamin C to promote wound healing  Tracheostomy- Trach change on 6/21by CCM - Trach care per routine.  - wean vent as able, t-collar during day  - CCM following for trach care & vent needs  Anemia- stable Likely due to a combination of blood loss from sacral wound, trach, and anemia of chronic disease.  -Transfuse if hgb falls below 7.  FEN/GI: Vital high protein liquid 39mL/hr, sugar free pro-state 82mL bid, po as tolerated PPx: pepcid, lovenox  Disposition: pending peg tube placement, trach/vent needs  Subjective:  Doing well this morning. Continue to request water though not tolerating it as yet   Objective: Temp:  [98.3 F (36.8 C)-99.8 F (37.7 C)] 98.7 F (37.1 C) (06/22 0814) Pulse Rate:  [87-96] 96 (06/22 0826) Resp:  [17-32] 18 (06/22 0826) BP: (100-137)/(61-89) 137/64 (06/22 0814) SpO2:  [93 %-100 %] 99 % (06/22 0826) FiO2 (%):  [28 %] 28 % (06/22 0826) Physical Exam: Gen: morbidly obese African American male resting comfortably in bed, no acute distress Heart: rrr, no m/r/g Lungs: Exam limited by body habitus. Could not appreciate coarse rhonchi this am. Abdomen: obese, NT, ND, Soft. Non-distended. Extremities: no edema or cyanosis Neuro: tracks, follows commands, moves all extremities  Sacrum: stage 4 sacral decubitus ulcer present Left groin: continued improved induration, unable to express pus out of this area  Laboratory: Recent Labs  Lab 07/07/17 0504 07/08/17 0533 07/10/17 0500  WBC 8.5 9.4 7.4  HGB 9.7* 9.7* 9.1*  HCT 34.8* 34.5* 32.4*  PLT 298 268 233   Recent Labs  Lab 07/07/17 0504 07/08/17 0533 07/10/17 0500  NA 144 142 138  K 3.7 3.7 3.9  CL 103 102 96*  CO2 34* 33* 32  BUN 40* 32* 22*  CREATININE 1.18 1.07 0.94  CALCIUM 9.2 9.2 9.0  GLUCOSE 142* 148* 117*    Imaging/Diagnostic Tests:  CT ABDOMEN WITHOUT  CONTRAST IMPRESSION: No acute CT finding of the abdomen. Likely biliary sludge/microlithiasis without evidence of acute inflammation. Post pyloric tube terminates within the first portion the duodenum. 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 Atelectasis/scarring at the lung bases.  Marjie Skiff, MD 07/10/2017, 12:03 PM PGY-1, [place] Intern pager: [phone], text pages welcome

## 2017-07-11 LAB — GLUCOSE, CAPILLARY
GLUCOSE-CAPILLARY: 116 mg/dL — AB (ref 65–99)
Glucose-Capillary: 129 mg/dL — ABNORMAL HIGH (ref 65–99)
Glucose-Capillary: 116 mg/dL — ABNORMAL HIGH (ref 65–99)
Glucose-Capillary: 138 mg/dL — ABNORMAL HIGH (ref 65–99)

## 2017-07-11 LAB — MRSA PCR SCREENING: MRSA by PCR: NEGATIVE

## 2017-07-11 NOTE — Progress Notes (Signed)
Family Medicine Teaching Service Daily Progress Note Intern Pager: [phone]  Patient name: Timothy Le Medical record number: [id number] Date of birth: 1960-08-19 Age: 57 y.o. Gender: male  Primary Care Provider: Kerin Perna, NP Consultants: PCCM, IR, Surgery, WOC Code Status: full  Pt Overview and Major Events to Date:  5/22 admitted to fpts-> subsequent decompensation, xfer to pccm 5/23 intubated 5/30 Tracheostomy 6/4 bedside sacral wound debridement in OR 6/17 PEG tube placement 6/21 Cuffless trach tube  Assessment and Plan:  Respiratory failure with hypercarbia  Obesity hypoventilation syndrome Trach tube changed on 6/21 to cuffless trach #6 without any complications. Continue dysphagia II diet. Seroquel decrease to 25 mg on 6/22, will plan to stop on 6/23. Continue to wean tube feeds as patient take more po. - Appreciate CCM recs for trach management - dysphagia II diet, honey thick liquids - passy-muir valve when able - vital signs per stepdown routine - torsemide 10mg  daily - prn metolazone for increased swelling - albuterol 2.5mg  q 2 hours prn - pepcid 20 mg bid - vital high protein 63mL/hr - stopping seroquel  left groin abscess 3 x 2 x 0.6cm abscess in left groin. Evaluated by surgery who recommended 10-14 po antibiotics. Felt doxy gave best coverage of gram negatives, anaerobes, and mrsa. Continue to get wicking agent to keep area dry. - doxycycline 100mg  bid (6/18-) - If starts to develop trouble with PO, can crush and give through PEG - towel or wicking agent to keep area dry  Sacral wound S/P debridement by surgery on 6/4. Surgery and Wound care signed off. Beefy red, no eschar. Connective tissue still left over sacral bone but otherwise no other tissue present in wound. Per wound care recs, no more hydrotherapy indicated. Continue santyl, wet to dry dressing change BID. - Follow up wound care recs - santyl, bid wet to drys - fentanyl 25-100 mcg  q2prn for pain from wound/with wound care - continue vitamin C to promote wound healing  Tracheostomy- s/p Trach change on 6/21by CCM - Trach care per routine.  - wean vent as able, t-collar during day  - CCM following for trach care & vent needs  Anemia- stable Likely due to a combination of blood loss from sacral wound, trach, and anemia of chronic disease.  -Transfuse if hgb falls below 7.  FEN/GI: Vital high protein liquid 74mL/hr, sugar free pro-state 79mL bid, po as tolerated PPx: pepcid, lovenox  Disposition: likely snf  Subjective:  Doing well this morning. Thirsty, no new complaints   Objective: Temp:  [97.6 F (36.4 C)-99.3 F (37.4 C)] 98.7 F (37.1 C) (06/23 0736) Pulse Rate:  [80-98] 92 (06/23 0940) Resp:  [14-31] 20 (06/23 0940) BP: (98-119)/(65-79) 119/79 (06/23 0736) SpO2:  [93 %-100 %] 99 % (06/23 0940) FiO2 (%):  [28 %] 28 % (06/23 0940) Weight:  [335 lb 1.6 oz (152 kg)] 335 lb 1.6 oz (152 kg) (06/23 0500) Physical Exam: Gen: morbidly obese African American male resting comfortably in bed, no acute distress Heart: regular rate rhytm, no mumurs/rubs/gallops Lungs: Exam limited by body habitus. Could not appreciate coarse rhonchi this am. Abdomen: obese, NT, ND, Soft. Non-distended. Extremities: no edema or cyanosis Neuro: tracks, follows commands, moves all extremities  Sacrum: stage 4 sacral decubitus ulcer present Left groin: improved tenderness, resolved induration  Laboratory: Recent Labs  Lab 07/07/17 0504 07/08/17 0533 07/10/17 0500  WBC 8.5 9.4 7.4  HGB 9.7* 9.7* 9.1*  HCT 34.8* 34.5* 32.4*  PLT 298 268 233  Recent Labs  Lab 07/07/17 0504 07/08/17 0533 07/10/17 0500  NA 144 142 138  K 3.7 3.7 3.9  CL 103 102 96*  CO2 34* 33* 32  BUN 40* 32* 22*  CREATININE 1.18 1.07 0.94  CALCIUM 9.2 9.2 9.0  GLUCOSE 142* 148* 117*    Imaging/Diagnostic Tests:  CT ABDOMEN WITHOUT CONTRAST IMPRESSION: No acute CT finding of the  abdomen. Likely biliary sludge/microlithiasis without evidence of acute inflammation. Post pyloric tube terminates within the first portion the duodenum. 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 Atelectasis/scarring at the lung bases.  Guadalupe Dawn, MD 07/11/2017, 10:25 AM PGY-1, [place] Intern pager: [phone], text pages welcome

## 2017-07-12 LAB — GLUCOSE, CAPILLARY
GLUCOSE-CAPILLARY: 140 mg/dL — AB (ref 65–99)
GLUCOSE-CAPILLARY: 126 mg/dL — AB (ref 65–99)
GLUCOSE-CAPILLARY: 133 mg/dL — AB (ref 65–99)
Glucose-Capillary: 142 mg/dL — ABNORMAL HIGH (ref 65–99)

## 2017-07-12 MED ORDER — ONDANSETRON HCL 4 MG/5ML PO SOLN
4.0000 mg | Freq: Three times a day (TID) | ORAL | Status: DC | PRN
Start: 2017-07-12 — End: 2017-07-15
  Filled 2017-07-12: qty 5

## 2017-07-12 NOTE — Progress Notes (Signed)
  Speech Language Pathology Treatment: Dysphagia  Patient Details Name: Timothy Le MRN: [id number] DOB: April 21, 1960 Today's Date: 07/12/2017 Time: 8115-7262 SLP Time Calculation (min) (ACUTE ONLY): 20 min  Assessment / Plan / Recommendation Clinical Impression  Patient was seen during lunch. His trach was downsized to a #6 Shiley cuffless and PMV in place. Patient positioned upright in bed and patient able to feed himself. Patient cued to take small bites and sips. He had intermittent throat clear and cough, but not consistent with any consistency. Coughs were productive with coughing up secretions to mouth and able to clear with oral suctioning. Recommended to continue current diet of Dys 2, honey thickened liquid, meds in puree. Recommend a repeat FEES to assess for readiness for diet upgrade.   HPI HPI: 57 yo male admitted with respiratory distress. Intubated 5/23, trach 5/30 . PMH includes morbid obesity, COPD, asthma, OSA, HTN, DM. PEG placed 6/17 (same day as swallow assessment ordered      SLP Plan  Continue with current plan of care       Recommendations  Diet recommendations: Dysphagia 2 (fine chop);Honey-thick liquid Liquids provided via: Cup Medication Administration: Whole meds with puree Supervision: Patient able to self feed;Full supervision/cueing for compensatory strategies Compensations: Slow rate;Small sips/bites Postural Changes and/or Swallow Maneuvers: Seated upright 90 degrees;Upright 30-60 min after meal      Patient may use Passy-Muir Speech Valve: Intermittently with supervision         SLP Visit Diagnosis: Dysphagia, oropharyngeal phase (R13.12);[place] (R49.1) Plan: Continue with current plan of care       [place], MA, CCC-SLP 07/12/2017 12:49 PM

## 2017-07-12 NOTE — Progress Notes (Signed)
PULMONARY / CRITICAL CARE MEDICINE   Name: Timothy Le MRN: [id number] DOB: [birth year]    ADMISSION DATE:  06/09/2017  CHIEF COMPLAINT:  Dyspnea  HISTORY OF PRESENT ILLNESS:   57 yo male with altered mental status from acute on chronic hypoxic/hypercapnic respiratory failure, CHF exacerbation.  Required intubation.  Failed to wean and required tracheostomy.  SUBJECTIVE:  No events overnight, no new complaints  VITAL SIGNS: BP 113/69   Pulse 94   Temp 98.6 F (37 C)   Resp 20   Ht 5\' 6"  (1.676 m)   Wt (!) 338 lb 6.5 oz (153.5 kg) Comment: normal for pt  SpO2 96%   BMI 54.62 kg/m   VENTILATOR SETTINGS: FiO2 (%):  [28 %] 28 %  INTAKE / OUTPUT: I/O last 3 completed shifts: In: 1700 [P.O.:480; Other:60; NG/GT:1160] Out: 1900 [Urine:1900]  PHYSICAL EXAMINATION: General: Chronically ill appearing male, NAD, on TC HEENT: [place]/AT, PERRL, EOM-I and MMM. Pulmonary: Coarse BS diffusely Cardiac: RRR, Nl S1/S2 and -M/R/G Abdomen: Soft, NT, ND and +BS Extremity: -edema and -tenderness Neuro: Awake oriented and appropriate  LABS:  BMET Recent Labs  Lab 07/07/17 0504 07/08/17 0533 07/10/17 0500  NA 144 142 138  K 3.7 3.7 3.9  CL 103 102 96*  CO2 34* 33* 32  BUN 40* 32* 22*  CREATININE 1.18 1.07 0.94  GLUCOSE 142* 148* 117*   Electrolytes Recent Labs  Lab 07/07/17 0504 07/08/17 0533 07/10/17 0500  CALCIUM 9.2 9.2 9.0   CBC Recent Labs  Lab 07/07/17 0504 07/08/17 0533 07/10/17 0500  WBC 8.5 9.4 7.4  HGB 9.7* 9.7* 9.1*  HCT 34.8* 34.5* 32.4*  PLT 298 268 233   Coag's Recent Labs  Lab 07/05/17 1259  APTT 33  INR 1.24   Sepsis Markers No results for input(s): LATICACIDVEN, PROCALCITON, O2SATVEN in the last 168 hours.  ABG Recent Labs  Lab 07/09/17 0440  PHART 7.447  PCO2ART 48.5*  PO2ART 77.1*   Liver Enzymes No results for input(s): AST, ALT, ALKPHOS, BILITOT, ALBUMIN in the last 168 hours.  Cardiac Enzymes No results for input(s):  TROPONINI, PROBNP in the last 168 hours.  Glucose Recent Labs  Lab 07/10/17 1806 07/10/17 2103 07/11/17 0735 07/11/17 1217 07/11/17 1552 07/11/17 2139  GLUCAP 133* 127* 129* 116* 116* 138*   Imaging I reviewed CXR myself, trach is in good position  STUDIES:  07/31/16 PFT > no airflow obstruction, severe restriction. 06/10/17 Echo> LVEF 55-60%, otherwise poor visualization  CULTURES: Sacral wound 6/04 >> E coli Sputum 6/04 >> Corynebacterium  ANTIBIOTICS: 5/22 levaquin > 5/24 6/08 Rocephin > off 07/06/2017 to doxycycline>>  LINES/TUBES: 5/23 ETT > 5/30 5/28 RUE PICC >  5/30 tracheostomy (ENT)>   ASSESSMENT / PLAN:  57 year old morbidly obese male with OSA/OHS now trached by ENT.  PCCM following for trach management.  Discussed with PCCM-NP.  Acute on chronic respiratory failure:  - Maintain trached  - Wt loss  - Maintain as dry as able  OSA:  - Maintain trached until able to loose weight  - Will need a sleep study and titration with a capped trach prior to serious consideration of decannulation  Trach status:  - Trach changed, maintain on current  Hypoxemia:  - Titrate O2 for sat of 88-92%  PCCM will continue to follow for trach management.  Rush Farmer, M.D. [facility] Pulmonary/Critical Care Medicine. Pager: [phone]. After hours pager: [phone].

## 2017-07-12 NOTE — Progress Notes (Addendum)
Family Medicine Teaching Service Daily Progress Note Intern Pager: [phone]  Patient name: Timothy Le Medical record number: [id number] Date of birth: [birth year] Age: 57 y.o. Gender: male  Primary Care Provider: Kerin Perna, NP Consultants: PCCM, IR, Surgery, WOC Code Status: full  Pt Overview and Major Events to Date:  5/22 admitted to fpts-> subsequent decompensation, xfer to pccm 5/23 intubated 5/30 Tracheostomy 6/4 bedside sacral wound debridement in OR 6/17 PEG tube placement 6/21 Cuffless trach tube  Assessment and Plan:  Respiratory failure with hypercarbia  Obesity hypoventilation syndrome Trach tube changed on 6/21 to cuffless trach #6 without any complications. Stopped seroquel 6/23. Taking very small steps in increasing PO intake. Can likely have speech re-evaluate with another FEES to see if diet can be modified. - Appreciate CCM recs for trach management - dysphagia II diet, honey thick liquids - passy-muir valve when able - vital signs per stepdown routine - torsemide 10mg  daily - prn metolazone for increased swelling - albuterol 2.5mg  q 2 hours prn - pepcid 20 mg bid - vital high protein 70mL/hr - stopping seroquel  left groin abscess 3 x 2 x 0.6cm abscess in left groin. Unable to express purulent fluid and is spontaneously draining. Induration greatly improved. Continue to place towel or wicking agent to keep area dry. - doxycycline 100mg  bid (6/18-) - If starts to develop trouble with PO, can crush and give through PEG - towel or wicking agent to keep area dry  Sacral wound S/P debridement by surgery on 6/4. Surgery and Wound care signed off. 90% pink granulation tissue. Wound care evaluated 6/24 and recommend wet to dry bid, santyl daily. Also recommend a sizewize mattress. - Follow up wound care recs - sizewize mattress - santyl, bid wet to drys - fentanyl 25-100 mcg q2prn for pain from wound/with wound care - continue vitamin C to promote  wound healing  Tracheostomy- s/p Trach change on 6/21by CCM - Trach care per routine.  - wean vent as able, t-collar during day  - CCM following for trach care & vent needs  Anemia- stable Likely due to a combination of blood loss from sacral wound, trach, and anemia of chronic disease.  -Transfuse if hgb falls below 7.  FEN/GI: Vital high protein liquid 58mL/hr, sugar free pro-state 54mL bid, po as tolerated PPx: pepcid, lovenox  Disposition: likely snf  Subjective:  Doing well this morning. Able to speak much better with PM valve. Tolerating honey thick liquids. Says feet are hurting.  Objective: Temp:  [98 F (36.7 C)-99.3 F (37.4 C)] 98.7 F (37.1 C) (06/24 1140) Pulse Rate:  [76-96] 76 (06/24 1100) Resp:  [18-28] 18 (06/24 0803) BP: (105-135)/(69-86) 110/69 (06/24 1100) SpO2:  [93 %-100 %] 100 % (06/24 1100) FiO2 (%):  [28 %] 28 % (06/24 0803) Weight:  [338 lb 6.5 oz (153.5 kg)] 338 lb 6.5 oz (153.5 kg) (06/24 0432) Physical Exam: Gen: morbidly obese AA male resting comfortably in bed, no acute distress Heart: rrr no m/r/g Lungs: Exam limited by body habitus. Slight wheezing this am in lung fields. Abdomen: obese, NT, ND, Soft. Non-distended. PEG tube in place LUQ. Extremities: no edema or cyanosis Neuro: tracks, follows commands, moves all extremities  Sacrum: stage 4 sacral decubitus ulcer present Left groin: still with some output, foul smelling odor, unable to express purulence  Laboratory: Recent Labs  Lab 07/07/17 0504 07/08/17 0533 07/10/17 0500  WBC 8.5 9.4 7.4  HGB 9.7* 9.7* 9.1*  HCT 34.8* 34.5* 32.4*  PLT  298 268 233   Recent Labs  Lab 07/07/17 0504 07/08/17 0533 07/10/17 0500  NA 144 142 138  K 3.7 3.7 3.9  CL 103 102 96*  CO2 34* 33* 32  BUN 40* 32* 22*  CREATININE 1.18 1.07 0.94  CALCIUM 9.2 9.2 9.0  GLUCOSE 142* 148* 117*    Imaging/Diagnostic Tests:  CT ABDOMEN WITHOUT CONTRAST IMPRESSION: No acute CT finding of the  abdomen. Likely biliary sludge/microlithiasis without evidence of acute inflammation. Post pyloric tube terminates within the first portion the duodenum. 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 Atelectasis/scarring at the lung bases.  Guadalupe Dawn, MD 07/12/2017, 12:25 PM PGY-1, [place] Intern pager: [phone], text pages welcome

## 2017-07-12 NOTE — Progress Notes (Signed)
Pt suctioned per request.  Then states, "I can't breath."  Removed PMV and increased FiO2 to 35%.  Lungs auscultated with wheezing noted in all lung fields and pt lungs tight overall.  Carla, RT called and asked her to give pt a breathing treatment and states ok.  Will continue to monitor.  Pt using abdominal muscles a bit to breath.

## 2017-07-12 NOTE — Consult Note (Signed)
[facility] Nurse wound follow up Assessment completed in [facility] 2w22. 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 Drainage (amount, consistency, odor). Heavy amount on existing dressing.  One moist 4 x 4 gauze was present in the wound bed. Periwound: Intact, normal color and texture Dressing procedure/placement/frequency: Saline moistened gauze twice daily, Santyl once daily.  I have modified the dressing instructions to include using enough kerlex to cover the entire wound bed and lightly fill the wound.  I have also added to order and place a Sizewise bed with an air mattress, and to turn the patient every 2 hours.  The wound associated with the trach face plate is almost healed.  What I saw was a very small area on the patient's right side at the corner of the face plate.  A split gauze dressing was in place and padding the site.  Monitor the wound area(s) for worsening of condition such as: Signs/symptoms of infection,  Increase in size,  Development of or worsening of odor, Development of pain, or increased pain at the affected locations.  Notify the medical team if any of these develop.  Val Riles, RN, MSN, CWOCN, CNS-BC, pager [phone]

## 2017-07-12 NOTE — NC FL2 (Signed)
[place] LEVEL OF CARE SCREENING TOOL     IDENTIFICATION  Patient Name: Timothy Le Birthdate: [birth year] Sex: male Admission Date (Current Location): 06/09/2017  [facility] and [place] Number:  Herbalist and Address:  [facility] [place], [place] [address] [place], [place] [address]      Provider Number: [id number]  Attending Physician Name and Address:  Dickie La, MD  Relative Name and Phone Number:       Current Level of Care: Hospital Recommended Level of Care: [place] Prior Approval Number:    Date Approved/Denied:   PASRR Number:    Discharge Plan: SNF    Current Diagnoses: Patient Active Problem List   Diagnosis Date Noted  . Chronic respiratory failure with hypoxia ([place]) 07/09/2017  . Abscess of groin, left   . Encounter for PEG (percutaneous endoscopic gastrostomy) ([place])   . Acute pulmonary edema (HCC)   . HCAP (healthcare-associated pneumonia)   . ARDS (adult respiratory distress syndrome) ([place])   . Pressure injury of skin 06/19/2017  . Difficult airway for intubation   . Respiratory distress 06/09/2017  . Respiratory failure with hypoxia and hypercapnia ([place]) 06/09/2017  . COPD with acute exacerbation ([place]) 06/17/2016  . Seasonal and perennial allergic rhinitis 05/24/2016  . AKI (acute kidney injury) ([place]) 04/08/2015  . Hypovolemic shock ([place]) 04/08/2015  . Sepsis ([place]) 04/08/2015  . Hyponatremia 04/08/2015  . HTN (hypertension) 03/28/2015  . Near syncope 03/28/2015  . Type 2 diabetes mellitus with hyperosmolar nonketotic hyperglycemia ([place]) 03/28/2015  . Hyperglycemia 03/27/2015  . Acute on chronic respiratory failure with hypoxemia ([place]) 03/27/2015  . Acute on chronic congestive heart failure ([place])   . COPD exacerbation ([place]) 02/07/2015  . Pressure ulcer 12/28/2014  . COPD mixed type ([place]) 12/27/2014  . Acute respiratory failure ([place]) 12/27/2014  . Morbid obesity ([place]) 12/27/2014  .  Diabetes mellitus type 2, controlled ([place]) 12/27/2014    Class: Chronic  . Obstructive sleep apnea 07/29/2013  . Peripheral edema 07/29/2013    Orientation RESPIRATION BLADDER Height & Weight     Self, Time, Situation, Place  Tracheostomy, O2(67mm shiley uncuffed at 28% FiO2- placed 06/17/17) Continent Weight: (!) 338 lb 6.5 oz (153.5 kg)(normal for pt) Height:  5\' 6"  (167.6 cm)  BEHAVIORAL SYMPTOMS/MOOD NEUROLOGICAL BOWEL NUTRITION STATUS      Continent Feeding tube(see DC summary- on tube feedings as well as diet)  AMBULATORY STATUS COMMUNICATION OF NEEDS Skin   Extensive Assist Verbally PU Stage and Appropriate Care     PU Stage 3 Dressing: (on neck gauze dressing changes PRN) PU Stage 4 Dressing: BID(on sacrum- moist to dry dressing and abdominal pads )               Personal Care Assistance Level of Assistance  Bathing, Dressing, Feeding Bathing Assistance: Maximum assistance Feeding assistance: Independent Dressing Assistance: Maximum assistance     Functional Limitations Info  Sight, Hearing, Speech Sight Info: Adequate Hearing Info: Adequate Speech Info: Adequate(pt does have trach. )    SPECIAL CARE FACTORS FREQUENCY  PT (By licensed PT), OT (By licensed OT), Speech therapy     PT Frequency: 3/wk OT Frequency: 3/wk     Speech Therapy Frequency: 2/wk      Contractures Contractures Info: Not present    Additional Factors Info  Code Status, Allergies Code Status Info: FULL Allergies Info: NKA           Current Medications (07/12/2017):  This  is the current hospital active medication list Current Facility-Administered Medications  Medication Dose Route Frequency Provider Last Rate Last Dose  . 0.9 %  sodium chloride infusion  250 mL Intravenous PRN Collene Gobble, MD 10 mL/hr at 07/05/17 0600    . bisacodyl (DULCOLAX) suppository 10 mg  10 mg Rectal Daily PRN Collene Gobble, MD   10 mg at 06/15/17 1114  . chlorhexidine (PERIDEX) 0.12 % solution 15 mL   15 mL Mouth Rinse BID Dickie La, MD   15 mL at 07/11/17 2142  . Chlorhexidine Gluconate Cloth 2 % PADS 6 each  6 each Topical Daily Chesley Mires, MD   6 each at 07/11/17 0915  . collagenase (SANTYL) ointment   Topical Daily Collene Gobble, MD      . docusate (COLACE) 50 MG/5ML liquid 100 mg  100 mg Per Tube BID Collene Gobble, MD   100 mg at 07/11/17 2141  . doxycycline (VIBRA-TABS) tablet 100 mg  100 mg Oral Q12H Guadalupe Dawn, MD   100 mg at 07/11/17 2142  . enoxaparin (LOVENOX) injection 75 mg  0.5 mg/kg Subcutaneous Q24H Dickie La, MD   75 mg at 07/11/17 1806  . famotidine (PEPCID) 40 MG/5ML suspension 20 mg  20 mg Per Tube BID Collene Gobble, MD   20 mg at 07/11/17 2142  . feeding supplement (VITAL HIGH PROTEIN) liquid 1,000 mL  1,000 mL Per Tube Continuous Guadalupe Dawn, MD 40 mL/hr at 07/10/17 0911 1,000 mL at 07/10/17 0911  . fentaNYL (SUBLIMAZE) injection 25-50 mcg  25-50 mcg Intravenous Q4H PRN Collene Gobble, MD   50 mcg at 07/12/17 0540  . insulin glargine (LANTUS) injection 10 Units  10 Units Subcutaneous QHS Collene Gobble, MD   10 Units at 07/11/17 2142  . ipratropium-albuterol (DUONEB) 0.5-2.5 (3) MG/3ML nebulizer solution 3 mL  3 mL Nebulization Q4H PRN Chesley Mires, MD      . lidocaine (XYLOCAINE) 2 % jelly 1 application  1 application Topical BID PRN Guadalupe Dawn, MD   1 application at 38/75/64 1007  . LORazepam (ATIVAN) injection 0.5 mg  0.5 mg Intravenous Q4H PRN Collene Gobble, MD   0.5 mg at 06/27/17 2146  . MEDLINE mouth rinse  15 mL Mouth Rinse q12n4p Dickie La, MD   15 mL at 07/11/17 1641  . metoprolol tartrate (LOPRESSOR) injection 5 mg  5 mg Intravenous Q6H PRN Chesley Mires, MD   5 mg at 07/01/17 0158  . mineral oil-hydrophilic petrolatum (AQUAPHOR) ointment   Topical BID Kathrene Alu, MD      . multivitamin liquid 15 mL  15 mL Per Tube Daily Dickie La, MD   15 mL at 07/11/17 0909  . nystatin (MYCOSTATIN) 100000 UNIT/ML suspension 500,000  Units  5 mL Oral QID Kathrene Alu, MD   500,000 Units at 07/11/17 2142  . polyethylene glycol (MIRALAX / GLYCOLAX) packet 17 g  17 g Oral Daily PRN Collene Gobble, MD   17 g at 06/10/17 1653  . potassium chloride 20 MEQ/15ML (10%) solution 10 mEq  10 mEq Oral Daily Kathrene Alu, MD   10 mEq at 07/11/17 0904  . RESOURCE THICKENUP CLEAR   Oral PRN Dickie La, MD      . silver nitrate applicators applicator 1 application  1 application Topical Daily PRN Collene Gobble, MD      . sodium chloride flush (NS) 0.9 % injection 10-40  mL  10-40 mL Intracatheter PRN Collene Gobble, MD   10 mL at 07/06/17 2200  . sodium chloride flush (NS) 0.9 % injection 10-40 mL  10-40 mL Intracatheter Q12H Chesley Mires, MD   10 mL at 07/11/17 2144  . sodium chloride flush (NS) 0.9 % injection 10-40 mL  10-40 mL Intracatheter PRN Chesley Mires, MD   40 mL at 07/05/17 1210  . sodium chloride flush (NS) 0.9 % injection 3 mL  3 mL Intravenous Q12H Collene Gobble, MD   3 mL at 07/08/17 2119  . sodium chloride flush (NS) 0.9 % injection 3 mL  3 mL Intravenous PRN Collene Gobble, MD   3 mL at 07/11/17 2143  . torsemide (DEMADEX) tablet 10 mg  10 mg Per Tube Daily Collene Gobble, MD   10 mg at 07/11/17 0904  . vitamin C (ASCORBIC ACID) tablet 250 mg  250 mg Per Tube Daily Guadalupe Dawn, MD   250 mg at 07/11/17 0865     Discharge Medications: Please see discharge summary for a list of discharge medications.  Relevant Imaging Results:  Relevant Lab Results:   Additional Information [id number]  Jorge Ny, [place]

## 2017-07-13 DIAGNOSIS — R5381 Other malaise: Secondary | ICD-10-CM

## 2017-07-13 LAB — GLUCOSE, CAPILLARY
GLUCOSE-CAPILLARY: 122 mg/dL — AB (ref 70–99)
GLUCOSE-CAPILLARY: 140 mg/dL — AB (ref 70–99)
Glucose-Capillary: 111 mg/dL — ABNORMAL HIGH (ref 70–99)
Glucose-Capillary: 139 mg/dL — ABNORMAL HIGH (ref 70–99)

## 2017-07-13 NOTE — Evaluation (Signed)
Physical Therapy Evaluation Patient Details Name: Timothy Le MRN: [id number] DOB: Apr 06, 1960 Today's Date: 07/13/2017   History of Present Illness  Timothy Le is a 57 y.o. male presenting with acute respiratory distress in the setting of worsening shortness of breath over several weeks. PMH is significant for morbid obesity, COPD on 2L [place], former tobacco use, OSA (noncompliant with CPAP), obesity hypoventilation syndrome, asthma, HTN, and Type 2 DM.    Clinical Impression  Pt admitted with above Pt motivated as he was living indep in his apartment PTA. Pt was walking with a walker and managing self care with minimal assist. Pt now requiring mod/maxA for bed mobility and is very deconditioned only ambulating 5' with RW. Acute PT to con' t to follow.    Follow Up Recommendations SNF;Supervision/Assistance - 24 hour    Equipment Recommendations  Rolling walker with 5" wheels(bariatric)    Recommendations for Other Services       Precautions / Restrictions Precautions Precautions: Fall Precaution Comments: trach, morbid obesity Restrictions Weight Bearing Restrictions: No      Mobility  Bed Mobility Overal bed mobility: Needs Assistance Bed Mobility: Supine to Sit;Sit to Supine     Supine to sit: Max assist;+2 for physical assistance Sit to supine: Min assist;+2 for physical assistance   General bed mobility comments: due to body habitus and air mattress pt required maxA for trunk elevation and to bring LEs off EOB, pt able to return self to L sidelying with use of bed rail and minA for LEs.  Transfers Overall transfer level: Needs assistance Equipment used: Rolling walker (2 wheeled) Transfers: Sit to/from Stand Sit to Stand: Mod assist;+2 physical assistance         General transfer comment: v/c's to push up from the bed and chair, modAX2 to steady pt during transition of hands as patient very unsteady. Pt then very dependent on  RW  Ambulation/Gait Ambulation/Gait assistance: Mod assist;+2 safety/equipment Gait Distance (Feet): 5 Feet(x2) Assistive device: Rolling walker (2 wheeled) Gait Pattern/deviations: Step-to pattern;Decreased stride length;Wide base of support Gait velocity: slow Gait velocity interpretation: <1.8 ft/sec, indicate of risk for recurrent falls General Gait Details: labored effort, rest break every 3 steps, pt with report "my legs are so weak, I'm going to fall"  Stairs            Wheelchair Mobility    Modified Rankin (Stroke Patients Only)       Balance Overall balance assessment: Needs assistance Sitting-balance support: Feet supported;Bilateral upper extremity supported Sitting balance-Leahy Scale: Fair     Standing balance support: Bilateral upper extremity supported Standing balance-Leahy Scale: Poor Standing balance comment: dependent on RW                             Pertinent Vitals/Pain Pain Assessment: 0-10 Pain Score: 8  Pain Location: bottom Pain Descriptors / Indicators: Burning Pain Intervention(s): Monitored during session    Home Living Family/patient expects to be discharged to:: Skilled nursing facility                 Additional Comments: Pt was living alone.    Prior Function Level of Independence: Independent;Independent with assistive device(s)         Comments: does have friends who go grocery shopping, aide to assist with bathing, was using a RW     Hand Dominance   Dominant Hand: Right    Extremity/Trunk Assessment   Upper Extremity Assessment  Upper Extremity Assessment: Overall WFL for tasks assessed    Lower Extremity Assessment Lower Extremity Assessment: Generalized weakness(grossly 3+/5 bilaterally)    Cervical / Trunk Assessment Cervical / Trunk Assessment: Kyphotic  Communication   Communication: Tracheostomy;Passy-Muir valve  Cognition Arousal/Alertness: Awake/alert Behavior During Therapy:  WFL for tasks assessed/performed Overall Cognitive Status: Within Functional Limits for tasks assessed                                        General Comments General comments (skin integrity, edema, etc.): pt with dressing on wound on R side of buttocks. pt able to communicate and maintain SpO2 >92% throughout session    Exercises     Assessment/Plan    PT Assessment Patient needs continued PT services  PT Problem List Decreased strength;Decreased range of motion;Decreased activity tolerance;Decreased balance;Decreased coordination;Decreased mobility;Decreased knowledge of use of DME;Decreased safety awareness;Pain       PT Treatment Interventions DME instruction;Gait training;Functional mobility training;Therapeutic activities;Therapeutic exercise;Balance training;Neuromuscular re-education    PT Goals (Current goals can be found in the Care Plan section)  Acute Rehab PT Goals Patient Stated Goal: go home PT Goal Formulation: With patient Time For Goal Achievement: 07/27/17 Potential to Achieve Goals: Good    Frequency Min 3X/week   Barriers to discharge Decreased caregiver support lives alone    Co-evaluation               AM-PAC PT "6 Clicks" Daily Activity  Outcome Measure Difficulty turning over in bed (including adjusting bedclothes, sheets and blankets)?: None Difficulty moving from lying on back to sitting on the side of the bed? : Unable Difficulty sitting down on and standing up from a chair with arms (e.g., wheelchair, bedside commode, etc,.)?: Unable Help needed moving to and from a bed to chair (including a wheelchair)?: A Lot Help needed walking in hospital room?: A Lot Help needed climbing 3-5 steps with a railing? : Total 6 Click Score: 11    End of Session Equipment Utilized During Treatment: Oxygen Activity Tolerance: Patient tolerated treatment well Patient left: in bed;with call bell/phone within reach;with nursing/sitter in  room(pt returned to bed for dressing change on buttocks) Nurse Communication: Mobility status PT Visit Diagnosis: Unsteadiness on feet (R26.81);Difficulty in walking, not elsewhere classified (R26.2);Muscle weakness (generalized) (M62.81)    Time: 0931-1000 PT Time Calculation (min) (ACUTE ONLY): 29 min   Charges:   PT Evaluation $PT Eval Moderate Complexity: 1 Mod PT Treatments $Therapeutic Activity: 8-22 mins   PT G Codes:        Kittie Plater, PT, DPT Pager #: [phone] Office #: [phone]   Alisi Lupien M Josejuan Hoaglin 07/13/2017, 10:28 AM

## 2017-07-13 NOTE — Progress Notes (Signed)
  Speech Language Pathology  Patient Details Name: NATNAEL BIEDERMAN MRN: [id number] DOB: Dec 13, 1960 Today's Date: 07/13/2017 Time:  -     Planning to perform repeat FEES today at 10:30 for possible upgrade.   Orbie Pyo [place].Ed CCC-SLP Pager [place], Any Mcneice Willis 07/13/2017, 8:37 AM

## 2017-07-13 NOTE — Progress Notes (Signed)
PULMONARY / CRITICAL CARE MEDICINE   Name: AZAREL BANNER MRN: [id number] DOB: 1961/01/10    ADMISSION DATE:  06/09/2017  CHIEF COMPLAINT:  Dyspnea  HISTORY OF PRESENT ILLNESS:   57 yo male with altered mental status from acute on chronic hypoxic/hypercapnic respiratory failure, CHF exacerbation.  Required intubation.  Failed to wean and required tracheostomy.  SUBJECTIVE:  No events overnight, continues to have secretions.  VITAL SIGNS: BP 107/73 (BP Location: Left Wrist)   Pulse 94   Temp 97.9 F (36.6 C) (Oral)   Resp (!) 21   Ht 5\' 6"  (1.676 m)   Wt (!) 338 lb 6.5 oz (153.5 kg) Comment: normal for pt  SpO2 98%   BMI 54.62 kg/m   VENTILATOR SETTINGS: FiO2 (%):  [28 %-35 %] 28 %  INTAKE / OUTPUT: I/O last 3 completed shifts: In: 1860 [P.O.:240; Other:60; NG/GT:1560] Out: 1325 [Urine:1325]  PHYSICAL EXAMINATION: General: Chronically ill appearing male, trach in place, NAD HEENT: [place]/AT, PERRL, EOM-I and MMM Pulmonary: Coarse BS bilaterally Cardiac: RRR, Nl S1/S2 and -M/R/G Abdomen: Soft, NT, ND and +BS Extremity: -edema and -tenderness Neuro: Awake oriented and appropriate  LABS:  BMET Recent Labs  Lab 07/07/17 0504 07/08/17 0533 07/10/17 0500  NA 144 142 138  K 3.7 3.7 3.9  CL 103 102 96*  CO2 34* 33* 32  BUN 40* 32* 22*  CREATININE 1.18 1.07 0.94  GLUCOSE 142* 148* 117*   Electrolytes Recent Labs  Lab 07/07/17 0504 07/08/17 0533 07/10/17 0500  CALCIUM 9.2 9.2 9.0   CBC Recent Labs  Lab 07/07/17 0504 07/08/17 0533 07/10/17 0500  WBC 8.5 9.4 7.4  HGB 9.7* 9.7* 9.1*  HCT 34.8* 34.5* 32.4*  PLT 298 268 233   Coag's No results for input(s): APTT, INR in the last 168 hours. Sepsis Markers No results for input(s): LATICACIDVEN, PROCALCITON, O2SATVEN in the last 168 hours.  ABG Recent Labs  Lab 07/09/17 0440  PHART 7.447  PCO2ART 48.5*  PO2ART 77.1*   Liver Enzymes No results for input(s): AST, ALT, ALKPHOS, BILITOT, ALBUMIN in  the last 168 hours.  Cardiac Enzymes No results for input(s): TROPONINI, PROBNP in the last 168 hours.  Glucose Recent Labs  Lab 07/11/17 2139 07/12/17 0720 07/12/17 1131 07/12/17 1707 07/12/17 2203 07/13/17 0754  GLUCAP 138* 142* 140* 126* 133* 122*   Imaging I reviewed CXR myself, trach is in good position  STUDIES:  07/31/16 PFT > no airflow obstruction, severe restriction. 06/10/17 Echo> LVEF 55-60%, otherwise poor visualization  CULTURES: Sacral wound 6/04 >> E coli Sputum 6/04 >> Corynebacterium  ANTIBIOTICS: 5/22 levaquin > 5/24 6/08 Rocephin > off 07/06/2017 to doxycycline>>  LINES/TUBES: 5/23 ETT > 5/30 5/28 RUE PICC >  5/30 tracheostomy (ENT)>   ASSESSMENT / PLAN:  57 year old morbidly obese male with OSA/OHS now trached by ENT.  PCCM following for trach management.  Discussed with RT and PCCM-NP.  Acute on chronic respiratory failure:  - Maintain 6 cuffless XLT, no decannulation given secretions  - Wt loss prior to serious consideration of decannulation  - Maintain as dry as able  OSA:  - Maintain trached until able to loose weight  - If lost weight will need a sleep study with the trach capped to evaluate for decannulation  Trach status:  - Trach changed, maintain on current, no downsize yet  Hypoxemia:  - Titrate O2 for sat of 88-92%  PCCM will follow intermittently for trach care, call sooner if needed.  Wesam G.  Nelda Marseille, M.D. [facility] Pulmonary/Critical Care Medicine. Pager: [phone]. After hours pager: [phone].

## 2017-07-13 NOTE — Progress Notes (Signed)
Inpatient Rehabilitation Admissions Coordinator  Patient discussed at quality collaborative meeting this morning. Pt was Mod I pta. Physical therapy is recommending SNF after prolonged hospitalization, but may need to consider an inpt rehab consult for a possible more intense therapies with multiple medical issues. I will contact Primary Medical team for a possible inpt rehab consult to assess if he would be a candidate.  Danne Baxter, RN, MSN Rehab Admissions Coordinator [phone] 07/13/2017 10:48 AM

## 2017-07-13 NOTE — Progress Notes (Signed)
Inpatient Rehabilitation Admissions Coordinator  I met with patient at bedside to discuss goals and expectations of an inpt rehab admission. Patient is in agreement and prefers inpt rehab rather than SNF. Did well with therapy today. Inpt rehab admission when pt felt medically ready for d/c. Hopeful for later this week. Please call me with any questions.  Danne Baxter, RN, MSN Rehab Admissions Coordinator [phone] 07/13/2017 6:23 PM

## 2017-07-13 NOTE — Consult Note (Signed)
Physical Medicine and Rehabilitation Consult Reason for Consult: Decreased functional mobility Referring Physician: [facility]   HPI: Timothy Le is a 57 y.o. right-handed male with history of asthma/COPD, OSA noncompliant with CPAP, tobacco abuse, morbid obesity with BMI 54.62, diabetes mellitus, hypertension.  Per chart review patient lives alone.  He was using a walker for mobility.  He has friends who will do his grocery shopping.  He does have an aide to assist with some basic ADLs.  One level apartment with 14 steps to entry.  He does have family that check on him.  Presented 06/09/2017 with increasing shortness of breath as well as weight gain.  Patient did require intubation.  Chest x-ray showed low lung volumes with bibasilar atelectasis.  Troponin 0 0.06, urine drug screen negative.  Creatinine 1.27.  Critical care pulmonary services consulted for respiratory failure continue to follow ultimately with tracheostomy performed 06/17/2017 changed to a cuffless #6 07/09/2017.  Speech therapy working with PMV.  Patient did require gastrostomy PEG tube placement for nutritional support 07/05/2017.  Currently on a dysphagia #3 thin liquid diet.  Subcutaneous heparin for DVT prophylaxis.  Wound care nurse follow-up for sacral wound with dressing care as directed.  Therapies have continued to follow with therapy evaluation completed 07/13/2017 considering inpatient rehab services for debilitation multi-medical.   Review of Systems  Constitutional:       Weight gain  HENT: Negative for hearing loss.   Eyes: Negative for blurred vision and double vision.  Respiratory: Positive for cough and shortness of breath.   Cardiovascular: Positive for leg swelling.  Gastrointestinal: Positive for constipation. Negative for nausea and vomiting.  Musculoskeletal: Positive for joint pain and myalgias.  Skin: Negative for rash.  Neurological: Negative for seizures.  All other systems reviewed and are  negative.  Past Medical History:  Diagnosis Date  . Asthma   . Chronic respiratory failure with hypoxia ([place]) 07/09/2017  . COPD (chronic obstructive pulmonary disease) ([place])   . Diabetes mellitus without complication ([place])   . Hypertension   . Shortness of breath dyspnea   . Sleep apnea    Past Surgical History:  Procedure Laterality Date  . IR GASTROSTOMY TUBE MOD SED  07/05/2017  . TRACHEOSTOMY TUBE PLACEMENT N/A 06/17/2017   Procedure: TRACHEOSTOMY;  Surgeon: Leta Baptist, MD;  Location: [facility] OR;  Service: ENT;  Laterality: N/A;   Family History  Problem Relation Age of Onset  . Asthma Sister   . Asthma Other        nephew  . Hypertension Sister   . Diabetes type II Sister    Social History:  reports that he quit smoking about 4 years ago. His smoking use included cigarettes. He started smoking about 33 years ago. He has a 0.25 pack-year smoking history. He has never used smokeless tobacco. He reports that he does not drink alcohol or use drugs. Allergies: No Known Allergies Medications Prior to Admission  Medication Sig Dispense Refill  . amLODipine (NORVASC) 10 MG tablet Take 10 mg by mouth daily.    Marland Kitchen atorvastatin (LIPITOR) 10 MG tablet Take 10 mg by mouth daily.    . furosemide (LASIX) 40 MG tablet Take 1 tablet (40 mg total) by mouth 2 (two) times daily. 60 tablet 2  . gemfibrozil (LOPID) 600 MG tablet Take 600 mg by mouth 2 (two) times daily before a meal. Thirty minutes before morning and evening meal.    . guaiFENesin (MUCINEX) 600 MG 12 hr  tablet Take 1 tablet (600 mg total) by mouth 2 (two) times daily. 40 tablet 0  . insulin glargine (LANTUS) 100 UNIT/ML injection Inject 0.6 mLs (60 Units total) into the skin daily. 10 mL 11  . lisinopril-hydrochlorothiazide (PRINZIDE,ZESTORETIC) 20-25 MG tablet Take 1 tablet by mouth daily.    Marland Kitchen loratadine (CLARITIN) 10 MG tablet 1 daily for allergy while needed (Patient taking differently: Take 10 mg by mouth daily as needed for  allergies. ) 30 tablet 11  . metFORMIN (GLUCOPHAGE) 500 MG tablet Take 1,000 mg by mouth 2 (two) times daily with a meal.    . blood glucose meter kit and supplies Dispense based on patient and insurance preference. Use up to four times daily as directed. (FOR ICD-9 250.00, 250.01). 1 each 0    Home: Home Living Family/patient expects to be discharged to:: [place]: Non-relatives/Friends Additional Comments: Pt was living alone.  Functional History: Prior Function Level of Independence: Independent, Independent with assistive device(s) Comments: does have friends who go grocery shopping, aide to assist with bathing, was using a RW Functional Status:  Mobility: Bed Mobility Overal bed mobility: Needs Assistance Bed Mobility: Supine to Sit, Sit to Supine Supine to sit: Max assist, +2 for physical assistance Sit to supine: Min assist, +2 for physical assistance General bed mobility comments: due to body habitus and air mattress pt required maxA for trunk elevation and to bring LEs off EOB, pt able to return self to L sidelying with use of bed rail and minA for LEs. Transfers Overall transfer level: Needs assistance Equipment used: Rolling walker (2 wheeled) Transfers: Sit to/from Stand Sit to Stand: Mod assist, +2 physical assistance General transfer comment: v/c's to push up from the bed and chair, modAX2 to steady pt during transition of hands as patient very unsteady. Pt then very dependent on RW Ambulation/Gait Ambulation/Gait assistance: Mod assist, +2 safety/equipment Gait Distance (Feet): 5 Feet(x2) Assistive device: Rolling walker (2 wheeled) Gait Pattern/deviations: Step-to pattern, Decreased stride length, Wide base of support General Gait Details: labored effort, rest break every 3 steps, pt with report "my legs are so weak, I'm going to fall" Gait velocity: slow Gait velocity interpretation: <1.8 ft/sec, indicate of risk for recurrent  falls    ADL:    Cognition: Cognition Overall Cognitive Status: Within Functional Limits for tasks assessed Orientation Level: Oriented to person, Oriented to place, Oriented to situation Cognition Arousal/Alertness: Awake/alert Behavior During Therapy: WFL for tasks assessed/performed Overall Cognitive Status: Within Functional Limits for tasks assessed  Blood pressure 135/84, pulse 91, temperature 97.7 F (36.5 C), temperature source Oral, resp. rate (!) 25, height '5\' 6"'  (1.676 m), weight (!) 153.5 kg (338 lb 6.5 oz), SpO2 98 %. Physical Exam  Constitutional: He is oriented to person, place, and time.  Obese male side-lying in bed  HENT:  Head: Normocephalic.  Eyes: Pupils are equal, round, and reactive to light.  Neck:  #6 trach with PMV, decreased phonation  Cardiovascular:  tachy  Respiratory: Effort normal.  GI: Soft.  Musculoskeletal: Normal range of motion.  Neurological: He is alert and oriented to person, place, and time. No cranial nerve deficit.  Fair insight and awareness. Language intact. Motor 4/5 UE and LE prox to distal. No sensory findings    Results for orders placed or performed during the hospital encounter of 06/09/17 (from the past 24 hour(s))  Glucose, capillary     Status: Abnormal   Collection Time: 07/12/17  5:07 PM  Result Value  Ref Range   Glucose-Capillary 126 (H) 65 - 99 mg/dL  Glucose, capillary     Status: Abnormal   Collection Time: 07/12/17 10:03 PM  Result Value Ref Range   Glucose-Capillary 133 (H) 65 - 99 mg/dL  Glucose, capillary     Status: Abnormal   Collection Time: 07/13/17  7:54 AM  Result Value Ref Range   Glucose-Capillary 122 (H) 70 - 99 mg/dL  Glucose, capillary     Status: Abnormal   Collection Time: 07/13/17 12:15 PM  Result Value Ref Range   Glucose-Capillary 140 (H) 70 - 99 mg/dL   No results found.   Assessment/Plan: Diagnosis: 56 yo morbidly obese male with debility related VDRf, multiple medical  1. Does  the need for close, 24 hr/day medical supervision in concert with the patient's rehab needs make it unreasonable for this patient to be served in a less intensive setting? Yes 2. Co-Morbidities requiring supervision/potential complications: trach mgt, OSA, skin care considerations 3. Due to bladder management, bowel management, safety, skin/wound care, disease management, medication administration, pain management and patient education, does the patient require 24 hr/day rehab nursing? Yes 4. Does the patient require coordinated care of a physician, rehab nurse, PT (1-2 hrs/day, 5 days/week), OT (1-2 hrs/day, 5 days/week) and SLP (1-2 hrs/day, 5 days/week) to address physical and functional deficits in the context of the above medical diagnosis(es)? Yes Addressing deficits in the following areas: balance, endurance, locomotion, strength, transferring, bowel/bladder control, bathing, dressing, feeding, grooming, toileting, speech and psychosocial support 5. Can the patient actively participate in an intensive therapy program of at least 3 hrs of therapy per day at least 5 days per week? Yes 6. The potential for patient to make measurable gains while on inpatient rehab is excellent 7. Anticipated functional outcomes upon discharge from inpatient rehab are modified independent  with PT, modified independent and supervision with OT, modified independent with SLP. 8. Estimated rehab length of stay to reach the above functional goals is: 8-13 days 9. Anticipated D/C setting: Home 10. Anticipated post D/C treatments: [place] therapy 11. Overall Rehab/Functional Prognosis: excellent  RECOMMENDATIONS: This patient's condition is appropriate for continued rehabilitative care in the following setting: CIR Patient has agreed to participate in recommended program. Yes Note that insurance prior authorization may be required for reimbursement for recommended care.  Comment: Rehab Admissions Coordinator to follow  up.  Thanks,  Meredith Staggers, MD, Mellody Drown  I have personally performed a face to face diagnostic evaluation of this patient. Additionally, I have reviewed and concur with the physician assistant's documentation above.     Lavon Paganini Angiulli, PA-C 07/13/2017

## 2017-07-13 NOTE — Progress Notes (Signed)
Family Medicine Teaching Service Daily Progress Note Intern Pager: [phone]  Patient name: Timothy Le Medical record number: [id number] Date of birth: 10-09-1960 Age: 57 y.o. Gender: male  Primary Care Provider: Kerin Perna, NP Consultants: PCCM, IR, Surgery, WOC Code Status: full  Pt Overview and Major Events to Date:  5/22 admitted to fpts-> subsequent decompensation, xfer to pccm 5/23 intubated 5/30 Tracheostomy 6/4 bedside sacral wound debridement in OR 6/17 PEG tube placement 6/21 Cuffless trach tube  Assessment and Plan:  Respiratory failure with hypercarbia  Obesity hypoventilation syndrome Trach tube changed on 6/21 to cuffless trach #6 without any complications. Doing well from respiratory standpoint. Will repeat FEES to see if diet can be upgraded 6/25.  - Appreciate CCM recs for trach management - dysphagia II diet, honey thick liquids-> possible advance if improves FEES - passy-muir valve as tolerated - vital signs per stepdown routine - torsemide 10mg  daily - prn metolazone for increased swelling - albuterol 2.5mg  q 2 hours prn - pepcid 20 mg bid - vital high protein 19mL/hr  left groin abscess 3 x 2 x 0.6cm abscess in left groin. Unable to express purulent fluid and is spontaneously draining. Induration greatly improved. Continue to place towel or wicking agent to keep area dry. - doxycycline 100mg  bid (6/18-) - If starts to develop trouble with PO, can crush and give through PEG - towel or wicking agent to keep area dry  Sacral wound S/P debridement by surgery on 6/4. Surgery and Wound care signed off. 90% pink granulation tissue. Wound care evaluated 6/24 and recommend wet to dry bid, santyl daily. Also recommend a sizewize mattress. - Follow up wound care recs - sizewize mattress - santyl, bid wet to drys - fentanyl 25-100 mcg q2prn for pain from wound/with wound care - continue vitamin C to promote wound healing  Tracheostomy- s/p Trach  change on 6/21by CCM - Trach care per routine.  - Per CCM recs will need serious weight loss before able to de-cannulate - in future will need night time sleep study with trach capped to further evaluate - CCM following for trach care & vent needs  Anemia- stable Likely due to a combination of blood loss from sacral wound, trach, and anemia of chronic disease.  -Transfuse if hgb falls below 7.  Long fingernails - Will trim fingernails 6/25  FEN/GI: Vital high protein liquid 79mL/hr, sugar free pro-state 65mL bid, po as tolerated PPx: pepcid, lovenox  Disposition: likely snf  Subjective:  Doing well this am. Complaining that finger and toenails are getting really long.  Objective: Temp:  [97.9 F (36.6 C)-98.9 F (37.2 C)] 97.9 F (36.6 C) (06/25 0735) Pulse Rate:  [66-99] 94 (06/25 0830) Resp:  [16-28] 21 (06/25 0830) BP: (106-124)/(68-81) 107/73 (06/25 0735) SpO2:  [95 %-100 %] 98 % (06/25 0830) FiO2 (%):  [28 %-35 %] 28 % (06/25 0830) Physical Exam: Gen: morbidly obese African American male resting comfortably in bed, no acute distress Heart: regular rate rhythm, no murmurs/rubs/gallops Lungs: Exam limited by body habitus. Slight wheezing this am in lung fields. Abdomen: obese, Non-Tender, Non-Distended, Soft. Non-distended. PEG tube in place LLQ. Extremities: no edema or cyanosis Neuro: tracks, follows commands, moves all extremities  Sacrum: stage 4 sacral decubitus ulcer present Left groin: Improving with wicking in place Left groin abscess.   Laboratory: Recent Labs  Lab 07/07/17 0504 07/08/17 0533 07/10/17 0500  WBC 8.5 9.4 7.4  HGB 9.7* 9.7* 9.1*  HCT 34.8* 34.5* 32.4*  PLT 298  268 233   Recent Labs  Lab 07/07/17 0504 07/08/17 0533 07/10/17 0500  NA 144 142 138  K 3.7 3.7 3.9  CL 103 102 96*  CO2 34* 33* 32  BUN 40* 32* 22*  CREATININE 1.18 1.07 0.94  CALCIUM 9.2 9.2 9.0  GLUCOSE 142* 148* 117*    Imaging/Diagnostic Tests:  CT ABDOMEN WITHOUT  CONTRAST IMPRESSION: No acute CT finding of the abdomen. Likely biliary sludge/microlithiasis without evidence of acute inflammation. Post pyloric tube terminates within the first portion the duodenum. 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 Atelectasis/scarring at the lung bases.  Guadalupe Dawn, MD 07/13/2017, 9:30 AM PGY-1, [place] Intern pager: [phone], text pages welcome

## 2017-07-13 NOTE — Procedures (Signed)
Objective Swallowing Evaluation: Type of Study: FEES-Fiberoptic Endoscopic Evaluation of Swallow   Patient Details  Name: Timothy Le MRN: [id number] Date of Birth: May 25, 1960  Today's Date: 07/13/2017 Time: SLP Start Time (ACUTE ONLY): 1057 -SLP Stop Time (ACUTE ONLY): 1122  SLP Time Calculation (min) (ACUTE ONLY): 25 min   Past Medical History:  Past Medical History:  Diagnosis Date  . Asthma   . Chronic respiratory failure with hypoxia ([place]) 07/09/2017  . COPD (chronic obstructive pulmonary disease) ([place])   . Diabetes mellitus without complication ([place])   . Hypertension   . Shortness of breath dyspnea   . Sleep apnea    Past Surgical History:  Past Surgical History:  Procedure Laterality Date  . IR GASTROSTOMY TUBE MOD SED  07/05/2017  . TRACHEOSTOMY TUBE PLACEMENT N/A 06/17/2017   Procedure: TRACHEOSTOMY;  Surgeon: Leta Baptist, MD;  Location: [facility] OR;  Service: ENT;  Laterality: N/A;   HPI: 57 yo male admitted with respiratory distress. Intubated 5/23, trach 5/30 . PMH includes morbid obesity, COPD, asthma, OSA, HTN, DM. PEG placed 6/17 (same day as swallow assessment ordered. Repeat FEES to deteremine diet/liquid upgrade.   Subjective: pt alert, very eager for POs    Assessment / Plan / Recommendation  CHL IP CLINICAL IMPRESSIONS 07/13/2017  Clinical Impression Improved oropharyngeal swallow compared to previous FEES on 07/06/17. [name] is now #6 cufless with PMV in place. Pharyngeal secretions appear normal; adipose tissue. Oral phase was within normal limits across solids and thin. Straw sips thin led to increased volume and velocity resulting in penetration into vestibule before the swallow thought to have been ejected -green tinted material not seen exiting trach during requests for volitional coughs. Mild pyriform sinsus residue and in the interarytenoid space present. Multiple trials thin liquid via cup were safe. Pt's positioning is a concern because ROM is limited with  the Bariatric/air matress bed to elevate head. Recommend upgrade to Dys 3, thin liquids, no straws, pills whole in applesauce. Pt was positioned with head of bed in max upright position, bed put in reverse Trendelenburg position and pillow behind his back and educated pt and RN to replicate this position with each meal/snack and with medications; PMV donned. ST will continue to follow for safety.   SLP Visit Diagnosis Dysphagia, pharyngeal phase (R13.13)  Attention and concentration deficit following --  Frontal lobe and executive function deficit following --  Impact on safety and function Mild aspiration risk      CHL IP TREATMENT RECOMMENDATION 07/13/2017  Treatment Recommendations Therapy as outlined in treatment plan below     Prognosis 07/13/2017  Prognosis for Safe Diet Advancement Good  Barriers to Reach Goals --  Barriers/Prognosis Comment --    CHL IP DIET RECOMMENDATION 07/13/2017  SLP Diet Recommendations Dysphagia 3 (Mech soft) solids;Thin liquid  Liquid Administration via Cup;No straw  Medication Administration Whole meds with puree  Compensations Slow rate;Small sips/bites;Clear throat intermittently;Multiple dry swallows after each bite/sip  Postural Changes Seated upright at 90 degrees      CHL IP OTHER RECOMMENDATIONS 07/13/2017  Recommended Consults --  Oral Care Recommendations Oral care BID  Other Recommendations --      CHL IP FOLLOW UP RECOMMENDATIONS 07/13/2017  Follow up Recommendations (No Data)      CHL IP FREQUENCY AND DURATION 07/13/2017  Speech Therapy Frequency (ACUTE ONLY) min 2x/week  Treatment Duration 2 weeks           CHL IP ORAL PHASE 07/13/2017  Oral Phase [facility]  Oral - Pudding Teaspoon --  Oral - Pudding Cup --  Oral - Honey Teaspoon --  Oral - Honey Cup WFL  Oral - Nectar Teaspoon NT  Oral - Nectar Cup WFL  Oral - Nectar Straw --  Oral - Thin Teaspoon NT  Oral - Thin Cup --  Oral - Thin Straw --  Oral - Puree NT  Oral - Mech Soft NT   Oral - Regular --  Oral - Multi-Consistency --  Oral - Pill --  Oral Phase - Comment --    CHL IP PHARYNGEAL PHASE 07/13/2017  Pharyngeal Phase Impaired  Pharyngeal- Pudding Teaspoon --  Pharyngeal --  Pharyngeal- Pudding Cup --  Pharyngeal --  Pharyngeal- Honey Teaspoon --  Pharyngeal --  Pharyngeal- Honey Cup Pharyngeal residue - pyriform  Pharyngeal --  Pharyngeal- Nectar Teaspoon NT  Pharyngeal Material does not enter airway  Pharyngeal- Nectar Cup Pharyngeal residue - pyriform  Pharyngeal Material does not enter airway  Pharyngeal- Nectar Straw --  Pharyngeal --  Pharyngeal- Thin Teaspoon NT  Pharyngeal --  Pharyngeal- Thin Cup Pharyngeal residue - pyriform  Pharyngeal --  Pharyngeal- Thin Straw Penetration/Aspiration before swallow;Pharyngeal residue - pyriform  Pharyngeal Material enters airway, remains ABOVE vocal cords then ejected out  Pharyngeal- Puree NT  Pharyngeal --  Pharyngeal- Mechanical Soft NT  Pharyngeal --  Pharyngeal- Regular WFL  Pharyngeal --  Pharyngeal- Multi-consistency --  Pharyngeal --  Pharyngeal- Pill --  Pharyngeal --  Pharyngeal Comment --     CHL IP CERVICAL ESOPHAGEAL PHASE 07/13/2017  Cervical Esophageal Phase WFL  Pudding Teaspoon --  Pudding Cup --  Honey Teaspoon --  Honey Cup --  Nectar Teaspoon --  Nectar Cup --  Nectar Straw --  Thin Teaspoon --  Thin Cup --  Thin Straw --  Puree --  Mechanical Soft --  Regular --  Multi-consistency --  Pill --  Cervical Esophageal Comment --    No flowsheet data found.  [name] 07/13/2017, 12:53 PM  [name].[employer] [phone]

## 2017-07-13 NOTE — Progress Notes (Signed)
OT Cancellation Note  Patient Details Name: Timothy Le MRN: [id number] DOB: [birth year]   Cancelled Treatment:    Reason Eval/Treat Not Completed: Other (comment). Appears that pt has been accepted to CIR. If OT eval needed for admission please page number below, otherwise will defer OT to CIR. Thanks  [place], OT/L  OT Clinical Specialist [phone]  07/13/2017, 5:24 PM

## 2017-07-14 LAB — GLUCOSE, CAPILLARY
GLUCOSE-CAPILLARY: 131 mg/dL — AB (ref 70–99)
GLUCOSE-CAPILLARY: 116 mg/dL — AB (ref 70–99)
Glucose-Capillary: 125 mg/dL — ABNORMAL HIGH (ref 70–99)
Glucose-Capillary: 131 mg/dL — ABNORMAL HIGH (ref 70–99)

## 2017-07-14 MED ORDER — VITAL HIGH PROTEIN PO LIQD
1000.0000 mL | ORAL | Status: DC
  Administered 2017-07-14: 1000 mL
  Filled 2017-07-14 (×3): qty 1000

## 2017-07-14 MED ORDER — ACETAMINOPHEN 500 MG PO TABS
1000.0000 mg | ORAL_TABLET | Freq: Once | ORAL | Status: AC
  Administered 2017-07-14: 1000 mg via ORAL
  Filled 2017-07-14: qty 2

## 2017-07-14 NOTE — Progress Notes (Signed)
PULMONARY / CRITICAL CARE MEDICINE   Name: Timothy Le MRN: [id number] DOB: Aug 29, 1960    ADMISSION DATE:  06/09/2017  CHIEF COMPLAINT:  Dyspnea  HISTORY OF PRESENT ILLNESS:   57 yo male with altered mental status from acute on chronic hypoxic/hypercapnic respiratory failure, CHF exacerbation.  Required intubation.  Failed to wean and required tracheostomy.  SUBJECTIVE:  No events overnight, continues to have secretions.  VITAL SIGNS: BP 135/79   Pulse 89   Temp 98.9 F (37.2 C)   Resp (!) 21   Ht 5\' 6"  (1.676 m)   Wt (!) 321 lb (145.6 kg)   SpO2 100%   BMI 51.81 kg/m   VENTILATOR SETTINGS: FiO2 (%):  [28 %] 28 %  INTAKE / OUTPUT: I/O last 3 completed shifts: In: 1280 [P.O.:720; Other:120; NG/GT:440] Out: 1375 [Urine:1375]  PHYSICAL EXAMINATION: General: Chronically ill appearing male, NAD HEENT: [place]/AT, PERRL, EOM-I and MMM Pulmonary: Coarse BS diffusely Cardiac: RRR, Nl S1/S2 and -M/R/G Abdomen: Soft, NT, ND and +BS Extremity: -edema and -tenderness Neuro: Awake and interactive, moving all ext to command  LABS:  BMET Recent Labs  Lab 07/08/17 0533 07/10/17 0500  NA 142 138  K 3.7 3.9  CL 102 96*  CO2 33* 32  BUN 32* 22*  CREATININE 1.07 0.94  GLUCOSE 148* 117*   Electrolytes Recent Labs  Lab 07/08/17 0533 07/10/17 0500  CALCIUM 9.2 9.0   CBC Recent Labs  Lab 07/08/17 0533 07/10/17 0500  WBC 9.4 7.4  HGB 9.7* 9.1*  HCT 34.5* 32.4*  PLT 268 233   Coag's No results for input(s): APTT, INR in the last 168 hours. Sepsis Markers No results for input(s): LATICACIDVEN, PROCALCITON, O2SATVEN in the last 168 hours.  ABG Recent Labs  Lab 07/09/17 0440  PHART 7.447  PCO2ART 48.5*  PO2ART 77.1*   Liver Enzymes No results for input(s): AST, ALT, ALKPHOS, BILITOT, ALBUMIN in the last 168 hours.  Cardiac Enzymes No results for input(s): TROPONINI, PROBNP in the last 168 hours.  Glucose Recent Labs  Lab 07/13/17 0754 07/13/17 1215  07/13/17 1706 07/13/17 2130 07/14/17 0744 07/14/17 1143  GLUCAP 122* 140* 111* 139* 131* 125*   Imaging I reviewed CXR myself, trach is in good position  STUDIES:  07/31/16 PFT > no airflow obstruction, severe restriction. 06/10/17 Echo> LVEF 55-60%, otherwise poor visualization  CULTURES: Sacral wound 6/04 >> E coli Sputum 6/04 >> Corynebacterium  ANTIBIOTICS: 5/22 levaquin > 5/24 6/08 Rocephin > off 07/06/2017 to doxycycline>>  LINES/TUBES: 5/23 ETT > 5/30 5/28 RUE PICC >  5/30 tracheostomy (ENT)>   I reviewed CXR myself, trach is in good position  ASSESSMENT / PLAN:  57 year old morbidly obese male with OSA/OHS now trached by ENT.  PCCM following for trach management.  Discussed with RT and PCCM-NP.  Acute on chronic respiratory failure:  - No decannulation, cuffless 6XLT in place  - No decannulation until wt loss  - Maintain as dry as able  OSA:  - Maintain trached until wt loss  - If lost weight will need a sleep study with the trach capped to evaluate for decannulation  Trach status:  - Trach changed, no downsize  Hypoxemia:  - Titrate O2 for sat of 88-92%  PCCM will follow intermittently for trach care, call sooner if needed.  Rush Farmer, M.D. [facility] Pulmonary/Critical Care Medicine. Pager: [phone]. After hours pager: [phone].

## 2017-07-14 NOTE — Evaluation (Signed)
Occupational Therapy Evaluation Patient Details Name: Timothy Le MRN: [id number] DOB: 11/20/1960 Today's Date: 07/14/2017    History of Present Illness Timothy Le is a 57 y.o. male presenting with acute respiratory distress in the setting of worsening shortness of breath over several weeks. PMH is significant for morbid obesity, COPD on 2L [place], former tobacco use, OSA (noncompliant with CPAP), obesity hypoventilation syndrome, asthma, HTN, and Type 2 DM.     Clinical Impression   PTA, pt reports that he was independent with basic ADL and has assistance for IADL tasks. He reports that he ambulates without assistive devices at baseline. Pt is highly motivated to participate with OT this session. He was able to complete bed mobility with max assist and tolerate seated ADL tasks at EOB. Pt currently requiring total assist for LB ADL, mod assist for UB ADL, and min assist for seated grooming tasks. He was additionally able to complete multiple lateral scoots at EOB with min assist this session. He is a good candidate for CIR level therapies to maximize independence and return to PLOF. Feel that pt will be able to tolerate intensity and duration of CIR. OT will continue to follow while admitted.    Follow Up Recommendations  CIR;Supervision/Assistance - 24 hour    Equipment Recommendations  Other (comment)(defer to next venue of care)    Recommendations for Other Services Rehab consult     Precautions / Restrictions Precautions Precautions: Fall Precaution Comments: trach, morbid obesity Restrictions Weight Bearing Restrictions: No      Mobility Bed Mobility Overal bed mobility: Needs Assistance Bed Mobility: Supine to Sit;Sit to Supine     Supine to sit: Max assist Sit to supine: Max assist   General bed mobility comments: max assist for management of bed mobility due to body habitus and air mattress  Transfers                 General transfer comment: Able to  complete 3 lateral scoots at EOB to reposition in bed with min assist    Balance Overall balance assessment: Needs assistance Sitting-balance support: Feet supported;Bilateral upper extremity supported Sitting balance-Leahy Scale: Fair Sitting balance - Comments: supervision at EOB                                   ADL either performed or assessed with clinical judgement   ADL Overall ADL's : Needs assistance/impaired Eating/Feeding: Supervision/ safety;Bed level   Grooming: Minimal assistance;Sitting   Upper Body Bathing: Moderate assistance;Sitting   Lower Body Bathing: Sitting/lateral leans;Total assistance   Upper Body Dressing : Moderate assistance;Sitting   Lower Body Dressing: Total assistance;Sitting/lateral leans                 General ADL Comments: Pt able to sit at EOB for ADL participation this session. He was additionally able to scoot toward [facility] with min assist 3x.      Vision Patient Visual Report: No change from baseline Vision Assessment?: No apparent visual deficits     Perception     Praxis      Pertinent Vitals/Pain Pain Assessment: Faces Faces Pain Scale: Hurts even more Pain Location: bottom Pain Descriptors / Indicators: Burning Pain Intervention(s): Limited activity within patient's tolerance;Monitored during session;Repositioned(turned as pt in same position since am)     Hand Dominance Right   Extremity/Trunk Assessment Upper Extremity Assessment Upper Extremity Assessment: Overall WFL for tasks assessed  Lower Extremity Assessment Lower Extremity Assessment: Generalized weakness       Communication Communication Communication: Tracheostomy;Passy-Muir valve   Cognition Arousal/Alertness: Awake/alert Behavior During Therapy: WFL for tasks assessed/performed Overall Cognitive Status: Within Functional Limits for tasks assessed                                     General Comments  Pt on 28%  FiO2 trach collar throughout. Pt reports he feels more comfortable sitting up. VSS.    Exercises     Shoulder Instructions      Home Living Family/patient expects to be discharged to:: Inpatient rehab Living Arrangements: Alone                               Additional Comments: Lives alone but friends and family check in      Prior Functioning/Environment Level of Independence: Needs assistance  Gait / Transfers Assistance Needed: independent for mobility ADL's / Homemaking Assistance Needed: independent for basic ADL but assistance for IADL; has not been able to leave his home   Comments: Has friends and family to assist with grocery shopping and IADL.         OT Problem List: Decreased strength;Decreased range of motion;Decreased activity tolerance;Impaired balance (sitting and/or standing);Decreased safety awareness;Decreased knowledge of use of DME or AE;Decreased knowledge of precautions;Cardiopulmonary status limiting activity;Pain      OT Treatment/Interventions: Self-care/ADL training;Therapeutic exercise;Energy conservation;DME and/or AE instruction;Therapeutic activities;Cognitive remediation/compensation;Patient/family education;Balance training    OT Goals(Current goals can be found in the care plan section) Acute Rehab OT Goals Patient Stated Goal: go home OT Goal Formulation: With patient Time For Goal Achievement: 07/28/17 Potential to Achieve Goals: Good ADL Goals Pt Will Perform Grooming: with set-up;sitting Pt Will Perform Lower Body Dressing: with mod assist;sit to/from stand Pt Will Transfer to Toilet: with min assist;stand pivot transfer;bedside commode Pt Will Perform Toileting - Clothing Manipulation and hygiene: with min assist;sit to/from stand;with adaptive equipment Pt/caregiver will Perform Home Exercise Program: Increased strength;Both right and left upper extremity;With written HEP provided;With Supervision Additional ADL Goal #1: Pt  will complete bed mobility in preparation for ADL participation with overall min assist.  OT Frequency: Min 3X/week   Barriers to D/C:            Co-evaluation              AM-PAC PT "6 Clicks" Daily Activity     Outcome Measure Help from another person eating meals?: A Little Help from another person taking care of personal grooming?: A Little Help from another person toileting, which includes using toliet, bedpan, or urinal?: A Lot Help from another person bathing (including washing, rinsing, drying)?: A Lot Help from another person to put on and taking off regular upper body clothing?: A Lot Help from another person to put on and taking off regular lower body clothing?: Total 6 Click Score: 13   End of Session Nurse Communication: Mobility status(mobility status, pt turned to the R for pressure relief)  Activity Tolerance: Patient tolerated treatment well Patient left: in bed;with call bell/phone within reach  OT Visit Diagnosis: Other abnormalities of gait and mobility (R26.89);Pain;Muscle weakness (generalized) (M62.81) Pain - part of body: (sacrum)                Time: 9811-9147 OT Time Calculation (min): 37 min Charges:  OT  General Charges $OT Visit: 1 Visit OT Evaluation $OT Eval Moderate Complexity: 1 Mod OT Treatments $Self Care/Home Management : 8-22 mins G-Codes:     Norman Herrlich, MS OTR/L  Pager: [place] A Antinette Keough 07/14/2017, 2:27 PM

## 2017-07-14 NOTE — Progress Notes (Signed)
Family medicine progress note  Patient with long, thick toenails 2/2 very prolonged hospital stay. Trimmed each toenail on bilateral lower extremities. Unfortunately some of patient's toenails too thick for store-bought nail trimmers. Had to grind off bits of toenail with each squeeze of the toenail trimmer until length was satisfactorily short. Patient tolerated very well. All toenails filed until all sharp points were satisfactorily resolved.  In the course of toenail trimming patient did cough up large amount of mucous. Was able to forcibly cough most of it up. Patient's nurse was able to suction out remainder that patient was unable to cough up on his own. Appreciate nursing help. Patient without any additional coughing after clearing. Will trim fingernails on 6/27.  Guadalupe Dawn MD PGY-1 Family Medicine Resident

## 2017-07-14 NOTE — H&P (Signed)
Physical Medicine and Rehabilitation Admission H&P    Chief Complaint  Patient presents with  . Shortness of Breath  : HPI: Timothy Le is a 57 year old right-handed male with history of asthma/COPD, OSA noncompliant with CPAP, tobacco abuse, morbid obesity with BMI 51.81, diabetes mellitus and hypertension.  Per chart review patient lives alone was using a walker for mobility.  He has friends that will do his grocery shopping.  He does have an aide to assist with some basic ADLs.  One level apartment with 14 steps to entry.  Family checks on him as needed.  Presented 06/09/2017 with increasing shortness of breath as well as progressive weight gain.  Patient did require intubation.  Chest x-ray showed lung volumes with bibasilar atelectasis.  Troponin  0.06, urine drug screen negative, creatinine 1.27.  Critical care pulmonary services consulted for respiratory failure continue to follow difficult weaning from ventilator ultimately underwent tracheostomy 06/17/2017 recently changed to a cuffless #6 XLT 07/09/2017.  Speech therapy working with PMV.  No plan for decannulation until weight loss.  Patient did require gastrostomy tube placement for nutritional support 07/05/2017.  Diet has been advanced to a mechanical soft thin liquid diet.  Subcutaneous Lovenox for DVT prophylaxis.  Wound care nurse follow-up for sacral wound with dressing care as directed.  Therapies have been initiated with progressive gains and recommendations of physical medicine rehab consult.  Patient was admitted for a comprehensive rehab program  Review of Systems  Constitutional: Negative for chills and fever.       Weight gain  HENT: Negative for hearing loss.   Eyes: Negative for blurred vision and double vision.  Respiratory: Positive for cough and shortness of breath.   Cardiovascular: Positive for leg swelling.  Gastrointestinal: Positive for constipation. Negative for nausea and vomiting.  Genitourinary: Negative  for flank pain and hematuria.  Musculoskeletal: Positive for joint pain and myalgias.  Skin: Negative for rash.  Neurological: Negative for seizures.   Past Medical History:  Diagnosis Date  . Asthma   . Chronic respiratory failure with hypoxia ([place]) 07/09/2017  . COPD (chronic obstructive pulmonary disease) ([place])   . Diabetes mellitus without complication ([place])   . Hypertension   . Shortness of breath dyspnea   . Sleep apnea    Past Surgical History:  Procedure Laterality Date  . IR GASTROSTOMY TUBE MOD SED  07/05/2017  . TRACHEOSTOMY TUBE PLACEMENT N/A 06/17/2017   Procedure: TRACHEOSTOMY;  Surgeon: Leta Baptist, MD;  Location: [facility] OR;  Service: ENT;  Laterality: N/A;   Family History  Problem Relation Age of Onset  . Asthma Sister   . Asthma Other        nephew  . Hypertension Sister   . Diabetes type II Sister    Social History:  reports that he quit smoking about 4 years ago. His smoking use included cigarettes. He started smoking about 33 years ago. He has a 0.25 pack-year smoking history. He has never used smokeless tobacco. He reports that he does not drink alcohol or use drugs. Allergies: No Known Allergies Medications Prior to Admission  Medication Sig Dispense Refill  . amLODipine (NORVASC) 10 MG tablet Take 10 mg by mouth daily.    Marland Kitchen atorvastatin (LIPITOR) 10 MG tablet Take 10 mg by mouth daily.    . furosemide (LASIX) 40 MG tablet Take 1 tablet (40 mg total) by mouth 2 (two) times daily. 60 tablet 2  . gemfibrozil (LOPID) 600 MG tablet Take 600 mg by  mouth 2 (two) times daily before a meal. Thirty minutes before morning and evening meal.    . guaiFENesin (MUCINEX) 600 MG 12 hr tablet Take 1 tablet (600 mg total) by mouth 2 (two) times daily. 40 tablet 0  . insulin glargine (LANTUS) 100 UNIT/ML injection Inject 0.6 mLs (60 Units total) into the skin daily. 10 mL 11  . lisinopril-hydrochlorothiazide (PRINZIDE,ZESTORETIC) 20-25 MG tablet Take 1 tablet by mouth daily.    Marland Kitchen  loratadine (CLARITIN) 10 MG tablet 1 daily for allergy while needed (Patient taking differently: Take 10 mg by mouth daily as needed for allergies. ) 30 tablet 11  . metFORMIN (GLUCOPHAGE) 500 MG tablet Take 1,000 mg by mouth 2 (two) times daily with a meal.    . blood glucose meter kit and supplies Dispense based on patient and insurance preference. Use up to four times daily as directed. (FOR ICD-9 250.00, 250.01). 1 each 0    Drug Regimen Review Drug regimen was reviewed and remains appropriate with no significant issues identified  Home: Home Living Family/patient expects to be discharged to:: [place]: Non-relatives/Friends Additional Comments: Pt was living alone.   Functional History: Prior Function Level of Independence: Independent, Independent with assistive device(s) Comments: does have friends who go grocery shopping, aide to assist with bathing, was using a RW  Functional Status:  Mobility: Bed Mobility Overal bed mobility: Needs Assistance Bed Mobility: Supine to Sit, Sit to Supine Supine to sit: Max assist, +2 for physical assistance Sit to supine: Min assist, +2 for physical assistance General bed mobility comments: due to body habitus and air mattress pt required maxA for trunk elevation and to bring LEs off EOB, pt able to return self to L sidelying with use of bed rail and minA for LEs. Transfers Overall transfer level: Needs assistance Equipment used: Rolling walker (2 wheeled) Transfers: Sit to/from Stand Sit to Stand: Mod assist, +2 physical assistance General transfer comment: v/c's to push up from the bed and chair, modAX2 to steady pt during transition of hands as patient very unsteady. Pt then very dependent on RW Ambulation/Gait Ambulation/Gait assistance: Mod assist, +2 safety/equipment Gait Distance (Feet): 5 Feet(x2) Assistive device: Rolling walker (2 wheeled) Gait Pattern/deviations: Step-to pattern, Decreased  stride length, Wide base of support General Gait Details: labored effort, rest break every 3 steps, pt with report "my legs are so weak, I'm going to fall" Gait velocity: slow Gait velocity interpretation: <1.8 ft/sec, indicate of risk for recurrent falls    ADL:    Cognition: Cognition Overall Cognitive Status: Within Functional Limits for tasks assessed Orientation Level: Oriented to person, Oriented to place, Oriented to situation Cognition Arousal/Alertness: Awake/alert Behavior During Therapy: WFL for tasks assessed/performed Overall Cognitive Status: Within Functional Limits for tasks assessed  Physical Exam: Blood pressure 135/79, pulse 93, temperature 98.9 F (37.2 C), resp. rate (!) 23, height '5\' 6"'  (1.676 m), weight (!) 145.6 kg (321 lb), SpO2 99 %. Physical Exam  Constitutional: He is oriented to person, place, and time.  Morbidly obese  HENT:  Head: Normocephalic and atraumatic.  Eyes: Pupils are equal, round, and reactive to light. EOM are normal.  Neck:  Trach with PMV, reasonable phonation/hoarse  Cardiovascular: Normal rate and regular rhythm.  Respiratory: Effort normal.  Raspy airway sounds  GI: Soft. Bowel sounds are normal. He exhibits no distension.  Musculoskeletal:  1+ LE edema  Neurological: He is alert and oriented to person, place, and time. No cranial nerve deficit. Coordination normal.  Fair insight and awareness. Motor 3+ to 4- prox to 4/5 distally in the Bilateral UE and LE's.   Skin:  Sacral decubitus was not examined as patient was not able to turn in bed  Psychiatric: He has a normal mood and affect. His behavior is normal.    Results for orders placed or performed during the hospital encounter of 06/09/17 (from the past 48 hour(s))  Glucose, capillary     Status: Abnormal   Collection Time: 07/12/17  5:07 PM  Result Value Ref Range   Glucose-Capillary 126 (H) 65 - 99 mg/dL  Glucose, capillary     Status: Abnormal   Collection Time:  07/12/17 10:03 PM  Result Value Ref Range   Glucose-Capillary 133 (H) 65 - 99 mg/dL  Glucose, capillary     Status: Abnormal   Collection Time: 07/13/17  7:54 AM  Result Value Ref Range   Glucose-Capillary 122 (H) 70 - 99 mg/dL  Glucose, capillary     Status: Abnormal   Collection Time: 07/13/17 12:15 PM  Result Value Ref Range   Glucose-Capillary 140 (H) 70 - 99 mg/dL  Glucose, capillary     Status: Abnormal   Collection Time: 07/13/17  5:06 PM  Result Value Ref Range   Glucose-Capillary 111 (H) 70 - 99 mg/dL  Glucose, capillary     Status: Abnormal   Collection Time: 07/13/17  9:30 PM  Result Value Ref Range   Glucose-Capillary 139 (H) 70 - 99 mg/dL  Glucose, capillary     Status: Abnormal   Collection Time: 07/14/17  7:44 AM  Result Value Ref Range   Glucose-Capillary 131 (H) 70 - 99 mg/dL  Glucose, capillary     Status: Abnormal   Collection Time: 07/14/17 11:43 AM  Result Value Ref Range   Glucose-Capillary 125 (H) 70 - 99 mg/dL   No results found.     Medical Problem List and Plan: 1.  Debility secondary to VDRF/multi-medical issues as noted above  -admit to inpatient rehab 2.  DVT Prophylaxis/Anticoagulation: Subcutaneous Lovenox.  Monitor for any bleeding episodes 3. Pain Management: Tylenol as needed 4. Mood: Provide emotional support 5. Neuropsych: This patient is capable of making decisions on his own behalf. 6. Skin/Wound Care/sacrococcyx decubitus: WOC follow-up with wound care as directed -air mattress and local pressure relief and wound care 7. Fluids/Electrolytes/Nutrition: Routine in and outs follow-up chemistries 8.  Tracheostomy 06/17/2017 per Dr. Benjamine Mola downsized to cuffless XLT #6 07/09/2017.  Speech therapy follow-up PMV.NO PLAN FOR DECANNULATION UNTIL WEIGHT LOSS 9.  Gastrostomy tube 07/05/2017.  Diet advanced to mechanical soft thin liquids. 10.  Diabetes mellitus with peripheral neuropathy.  Hemoglobin A1c 5.4.  Lantus insulin 10 units nightly.  Check  blood sugars as directed 11.  Morbid obesity.  BMI 51.81.  Follow-up dietary 12.  Acute on chronic anemia.  Follow-up CBC 13.  Hypertension.  Demadex 10 mg daily.  Post Admission Physician Evaluation: 1. Functional deficits secondary  to debility. 2. Patient is admitted to receive collaborative, interdisciplinary care between the physiatrist, rehab nursing staff, and therapy team. 3. Patient's level of medical complexity and substantial therapy needs in context of that medical necessity cannot be provided at a lesser intensity of care such as a SNF. 4. Patient has experienced substantial functional loss from his/her baseline which was documented above under the "Functional History" and "Functional Status" headings.  Judging by the patient's diagnosis, physical exam, and functional history, the patient has potential for functional progress which will result in measurable  gains while on inpatient rehab.  These gains will be of substantial and practical use upon discharge  in facilitating mobility and self-care at the household level. 5. Physiatrist will provide 24 hour management of medical needs as well as oversight of the therapy plan/treatment and provide guidance as appropriate regarding the interaction of the two. 6. The Preadmission Screening has been reviewed and patient status is unchanged unless otherwise stated above. 7. 24 hour rehab nursing will assist with bladder management, bowel management, safety, skin/wound care, disease management, medication administration, pain management and patient education  and help integrate therapy concepts, techniques,education, etc. 8. PT will assess and treat for/with: Lower extremity strength, range of motion, stamina, balance, functional mobility, safety, adaptive techniques and equipment, family ed, activity tolerance.   Goals are: mod I., ?some w/c goals 9. OT will assess and treat for/with: ADL's, functional mobility, safety, upper extremity strength,  adaptive techniques and equipment, NMR, family ed, ego support.   Goals are: mod I to supervision. Therapy may proceed with showering this patient. 10. SLP will assess and treat for/with: speech, communication, family ed.  Goals are: mod I. 11. Case Management and Social Worker will assess and treat for psychological issues and discharge planning. 12. Team conference will be held weekly to assess progress toward goals and to determine barriers to discharge. 13. Patient will receive at least 3 hours of therapy per day at least 5 days per week. 14. ELOS: 10-15 days       15. Prognosis:  excellent    I have personally performed a face to face diagnostic evaluation of this patient and formulated the key components of the plan.  Additionally, I have personally reviewed laboratory data, imaging studies, as well as relevant notes and concur with the physician assistant's documentation above.  Meredith Staggers, MD, FAAPMR   Lavon Paganini [place], PA-C 07/14/2017

## 2017-07-14 NOTE — Progress Notes (Addendum)
Family Medicine Teaching Service Daily Progress Note Intern Pager: [phone]  Patient name: Timothy Le Medical record number: [id number] Date of birth: 06/22/1960 Age: 57 y.o. Gender: male  Primary Care Provider: Kerin Perna, NP Consultants: PCCM, IR, Surgery, WOC Code Status: full  Pt Overview and Major Events to Date:  5/22 admitted to fpts-> subsequent decompensation, xfer to pccm 5/23 intubated 5/30 Tracheostomy 6/4 bedside sacral wound debridement in OR 6/17 PEG tube placement 6/21 Cuffless trach tube 6/25 FEES, advanced to dysphagia III, thin liquids  Assessment and Plan:  Respiratory failure with hypercarbia  Obesity hypoventilation syndrome Trach tube changed on 6/21 to cuffless trach #6 without any complications. Continues to do really well from respiratory standpoint. Will likely need to lose a significant amount of weight prior to any real attempt to de-cannulate. Advanced to dysphagia III diet and thin liquids on 6/25. Patient is now stable for discharge to CIR when he has placement. - appreciate CCM recs, follow up in trach clinic - dysphagia III diet, thin liquids - vital signs per stepdown routine - torsemide 10mg  daily - prn metolazone for increased swelling - albuterol 2.5mg  q 2 hours prn - pepcid 20mg  bid - will decrease feeds to 20 mL/hr due to new diet  left groin abscess 3 x 2 x 0.6cm abscess in left groin. Unable to express purulent fluid and is spontaneously draining. Induration greatly improved. Continue to place towel or wicking agent to keep area dry. - doxycycline 100mg  bid (6/18-) - If starts to develop trouble with PO, can crush and give through PEG - towel or wicking agent to keep area dry  Sacral wound S/P debridement by surgery on 6/4. Surgery and Wound care signed off. 90% pink granulation tissue. Wound care evaluated 6/24 and recommend wet to dry bid, santyl daily. Also recommend a sizewize mattress. - Follow up wound care recs -  sizewize mattress - santyl, bid wet to drys - fentanyl 25-100 mcg q2prn for pain from wound/with wound care - continue vitamin C to promote wound healing  Tracheostomy- s/p Trach change on 6/21by CCM - Trach care per routine.  - Per CCM recs will need serious weight loss before able to de-cannulate - in future will need night time sleep study with trach capped to further evaluate - CCM following for trach care & vent needs - follow up in trach clinic after discharge  Anemia- stable Likely due to a combination of blood loss from sacral wound, trach, and anemia of chronic disease.  -Transfuse if hgb falls below 7.  Long fingernails - Will trim fingernails 6/26  FEN/GI: Vital high protein liquid 38mL/hr PPx: pepcid, lovenox  Disposition: likely CIR vs snf, medically ready for discharge to CIR  Subjective:  Doing well this am. So happy that he can now drink water. Would like fingernails trimmed.  Objective: Temp:  [97.7 F (36.5 C)-99 F (37.2 C)] 98.7 F (37.1 C) (06/26 0745) Pulse Rate:  [76-92] 87 (06/26 0858) Resp:  [18-32] 18 (06/26 0858) BP: (113-138)/(69-85) 122/85 (06/26 0745) SpO2:  [98 %-100 %] 99 % (06/26 0858) FiO2 (%):  [28 %] 28 % (06/26 0858) Weight:  [321 lb (145.6 kg)] 321 lb (145.6 kg) (06/26 0500) Physical Exam: Gen: morbidly obese AA male resting comfortably in bed, NAD Heart: rrr, no m/r/g Lungs: Exam limited by body habitus. Slight wheezing this am in lung fields. Abdomen: obese, Non-Tender, Non-Distended, Soft. Non-distended. PEG tube in place LLQ. Extremities: no edema or cyanosis Neuro: tracks, follows commands, moves  all extremities  Sacrum: stage 4 sacral decubitus ulcer present Left groin: Improving with wicking in place Left groin abscess.   Laboratory: Recent Labs  Lab 07/08/17 0533 07/10/17 0500  WBC 9.4 7.4  HGB 9.7* 9.1*  HCT 34.5* 32.4*  PLT 268 233   Recent Labs  Lab 07/08/17 0533 07/10/17 0500  NA 142 138  K 3.7 3.9  CL 102  96*  CO2 33* 32  BUN 32* 22*  CREATININE 1.07 0.94  CALCIUM 9.2 9.0  GLUCOSE 148* 117*    Imaging/Diagnostic Tests:  CT ABDOMEN WITHOUT CONTRAST IMPRESSION: No acute CT finding of the abdomen. Likely biliary sludge/microlithiasis without evidence of acute inflammation. Post pyloric tube terminates within the first portion the duodenum. 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 Atelectasis/scarring at the lung bases.  Guadalupe Dawn, MD 07/14/2017, 9:04 AM PGY-1, [place] Intern pager: [phone], text pages welcome

## 2017-07-14 NOTE — Progress Notes (Signed)
Inpatient Rehabilitation Admissions Coordinator  Noted patient felt medically ready to admit by Dr. Kris Mouton. Patient has worked with therapy once 6/25. I await further progress with therapy before expecting pt to do 3 hrs per day of therapy required of an inpt rehab admission. Hopeful to admit in the next 24 to 48 hrs. I have updated RN CM, Neoma Laming and will notify Dr. Kris Mouton.I will follow up tomorrow. OT and SLP  to see patient today.  Danne Baxter, RN, MSN Rehab Admissions Coordinator [phone] 07/14/2017 10:42 AM

## 2017-07-14 NOTE — Progress Notes (Signed)
Pt reported pain to LLQ. RN checked placement of g tube and receive 10 ml of residual noted, flushes easily, and no redness at insertion site.

## 2017-07-15 ENCOUNTER — Encounter (HOSPITAL_COMMUNITY): Payer: Self-pay | Admitting: *Deleted

## 2017-07-15 ENCOUNTER — Inpatient Hospital Stay (HOSPITAL_COMMUNITY)
Admission: RE | Admit: 2017-07-15 | Discharge: 2017-07-29 | DRG: 945 | Disposition: A | Discharge status: Skilled Nursing Facility | Payer: Medicaid Other | Source: Intra-hospital | Attending: Physical Medicine & Rehabilitation | Admitting: Physical Medicine & Rehabilitation

## 2017-07-15 DIAGNOSIS — Z833 Family history of diabetes mellitus: Secondary | ICD-10-CM | POA: Diagnosis not present

## 2017-07-15 DIAGNOSIS — D638 Anemia in other chronic diseases classified elsewhere: Secondary | ICD-10-CM | POA: Diagnosis not present

## 2017-07-15 DIAGNOSIS — Z87891 Personal history of nicotine dependence: Secondary | ICD-10-CM | POA: Diagnosis not present

## 2017-07-15 DIAGNOSIS — Z9119 Patient's noncompliance with other medical treatment and regimen: Secondary | ICD-10-CM

## 2017-07-15 DIAGNOSIS — E669 Obesity, unspecified: Secondary | ICD-10-CM

## 2017-07-15 DIAGNOSIS — R5381 Other malaise: Principal | ICD-10-CM | POA: Diagnosis present

## 2017-07-15 DIAGNOSIS — E1169 Type 2 diabetes mellitus with other specified complication: Secondary | ICD-10-CM | POA: Diagnosis not present

## 2017-07-15 DIAGNOSIS — G4733 Obstructive sleep apnea (adult) (pediatric): Secondary | ICD-10-CM | POA: Diagnosis present

## 2017-07-15 DIAGNOSIS — E785 Hyperlipidemia, unspecified: Secondary | ICD-10-CM

## 2017-07-15 DIAGNOSIS — Z931 Gastrostomy status: Secondary | ICD-10-CM | POA: Diagnosis not present

## 2017-07-15 DIAGNOSIS — R740 Nonspecific elevation of levels of transaminase and lactic acid dehydrogenase [LDH]: Secondary | ICD-10-CM

## 2017-07-15 DIAGNOSIS — J449 Chronic obstructive pulmonary disease, unspecified: Secondary | ICD-10-CM | POA: Diagnosis present

## 2017-07-15 DIAGNOSIS — Z79899 Other long term (current) drug therapy: Secondary | ICD-10-CM | POA: Diagnosis not present

## 2017-07-15 DIAGNOSIS — Z794 Long term (current) use of insulin: Secondary | ICD-10-CM

## 2017-07-15 DIAGNOSIS — D6489 Other specified anemias: Secondary | ICD-10-CM | POA: Diagnosis present

## 2017-07-15 DIAGNOSIS — R0989 Other specified symptoms and signs involving the circulatory and respiratory systems: Secondary | ICD-10-CM | POA: Diagnosis not present

## 2017-07-15 DIAGNOSIS — J9611 Chronic respiratory failure with hypoxia: Secondary | ICD-10-CM | POA: Diagnosis present

## 2017-07-15 DIAGNOSIS — Z930 Tracheostomy status: Secondary | ICD-10-CM

## 2017-07-15 DIAGNOSIS — L89154 Pressure ulcer of sacral region, stage 4: Secondary | ICD-10-CM | POA: Diagnosis present

## 2017-07-15 DIAGNOSIS — Z430 Encounter for attention to tracheostomy: Secondary | ICD-10-CM

## 2017-07-15 DIAGNOSIS — I10 Essential (primary) hypertension: Secondary | ICD-10-CM

## 2017-07-15 DIAGNOSIS — S31000S Unspecified open wound of lower back and pelvis without penetration into retroperitoneum, sequela: Secondary | ICD-10-CM | POA: Diagnosis not present

## 2017-07-15 DIAGNOSIS — D62 Acute posthemorrhagic anemia: Secondary | ICD-10-CM | POA: Diagnosis not present

## 2017-07-15 DIAGNOSIS — E1142 Type 2 diabetes mellitus with diabetic polyneuropathy: Secondary | ICD-10-CM | POA: Diagnosis present

## 2017-07-15 DIAGNOSIS — Z6841 Body Mass Index (BMI) 40.0 and over, adult: Secondary | ICD-10-CM | POA: Diagnosis not present

## 2017-07-15 DIAGNOSIS — R7401 Elevation of levels of liver transaminase levels: Secondary | ICD-10-CM

## 2017-07-15 DIAGNOSIS — S31000A Unspecified open wound of lower back and pelvis without penetration into retroperitoneum, initial encounter: Secondary | ICD-10-CM

## 2017-07-15 HISTORY — DX: Failed or difficult intubation, initial encounter: T88.4XXA

## 2017-07-15 LAB — CREATININE, SERUM: Creatinine, Ser: 0.87 mg/dL (ref 0.61–1.24)

## 2017-07-15 LAB — CBC
HEMATOCRIT: 30.4 % — AB (ref 39.0–52.0)
HEMOGLOBIN: 8.5 g/dL — AB (ref 13.0–17.0)
MCH: 25.3 pg — ABNORMAL LOW (ref 26.0–34.0)
MCHC: 28 g/dL — ABNORMAL LOW (ref 30.0–36.0)
MCV: 90.5 fL (ref 78.0–100.0)
Platelets: 213 10*3/uL (ref 150–400)
RBC: 3.36 MIL/uL — ABNORMAL LOW (ref 4.22–5.81)
RDW: 17.2 % — AB (ref 11.5–15.5)
WBC: 7 10*3/uL (ref 4.0–10.5)

## 2017-07-15 LAB — GLUCOSE, CAPILLARY
Glucose-Capillary: 111 mg/dL — ABNORMAL HIGH (ref 70–99)
Glucose-Capillary: 130 mg/dL — ABNORMAL HIGH (ref 70–99)
Glucose-Capillary: 116 mg/dL — ABNORMAL HIGH (ref 70–99)
Glucose-Capillary: 124 mg/dL — ABNORMAL HIGH (ref 70–99)

## 2017-07-15 MED ORDER — AQUAPHOR EX OINT: TOPICAL_OINTMENT | Freq: Two times a day (BID) | CUTANEOUS | 0 refills | Status: DC

## 2017-07-15 MED ORDER — DOXYCYCLINE HYCLATE 100 MG PO TABS: 100.0000 mg | ORAL_TABLET | Freq: Two times a day (BID) | ORAL | 0 refills | Status: DC

## 2017-07-15 MED ORDER — POTASSIUM CHLORIDE 20 MEQ/15ML (10%) PO SOLN
10.0000 meq | Freq: Every day | ORAL | Status: DC
  Administered 2017-07-16 – 2017-07-21 (×6): 10 meq via ORAL
  Filled 2017-07-15 (×6): qty 15

## 2017-07-15 MED ORDER — TORSEMIDE 10 MG PO TABS: 10.0000 mg | ORAL_TABLET | Freq: Every day | ORAL | 0 refills | Status: DC

## 2017-07-15 MED ORDER — BISACODYL 10 MG RE SUPP: 10.0000 mg | Freq: Every day | RECTAL | 0 refills | Status: DC | PRN

## 2017-07-15 MED ORDER — OXYCODONE HCL 5 MG PO TABS
5.0000 mg | ORAL_TABLET | ORAL | Status: DC | PRN
  Administered 2017-07-15 – 2017-07-19 (×12): 10 mg via ORAL
  Administered 2017-07-19: 5 mg via ORAL
  Administered 2017-07-19: 10 mg via ORAL
  Administered 2017-07-19: 5 mg via ORAL
  Administered 2017-07-19 – 2017-07-20 (×2): 10 mg via ORAL
  Administered 2017-07-20: 5 mg via ORAL
  Administered 2017-07-20 – 2017-07-21 (×2): 10 mg via ORAL
  Administered 2017-07-21: 5 mg via ORAL
  Administered 2017-07-21 – 2017-07-22 (×6): 10 mg via ORAL
  Administered 2017-07-23: 5 mg via ORAL
  Administered 2017-07-23: 10 mg via ORAL
  Administered 2017-07-23: 5 mg via ORAL
  Administered 2017-07-24 – 2017-07-27 (×14): 10 mg via ORAL
  Administered 2017-07-27: 5 mg via ORAL
  Administered 2017-07-27 – 2017-07-29 (×4): 10 mg via ORAL
  Filled 2017-07-15 (×5): qty 2
  Filled 2017-07-15: qty 1
  Filled 2017-07-15 (×14): qty 2
  Filled 2017-07-15: qty 1
  Filled 2017-07-15 (×12): qty 2
  Filled 2017-07-15: qty 1
  Filled 2017-07-15 (×16): qty 2

## 2017-07-15 MED ORDER — LIDOCAINE HCL URETHRAL/MUCOSAL 2 % EX GEL: 1.0000 "application " | Freq: Two times a day (BID) | CUTANEOUS | 0 refills | Status: DC | PRN

## 2017-07-15 MED ORDER — LORAZEPAM 2 MG/ML IJ SOLN: 0.5000 mg | INTRAMUSCULAR | 0 refills | Status: DC | PRN

## 2017-07-15 MED ORDER — IPRATROPIUM-ALBUTEROL 0.5-2.5 (3) MG/3ML IN SOLN: 3.0000 mL | RESPIRATORY_TRACT | 0 refills | Status: DC | PRN

## 2017-07-15 MED ORDER — ADULT MULTIVITAMIN LIQUID CH: 15.0000 mL | Freq: Every day | ORAL | 0 refills | Status: DC

## 2017-07-15 MED ORDER — ENOXAPARIN SODIUM 80 MG/0.8ML ~~LOC~~ SOLN
80.0000 mg | SUBCUTANEOUS | Status: DC
  Administered 2017-07-15 – 2017-07-28 (×14): 80 mg via SUBCUTANEOUS
  Filled 2017-07-15 (×16): qty 0.8

## 2017-07-15 MED ORDER — AQUAPHOR EX OINT
TOPICAL_OINTMENT | Freq: Two times a day (BID) | CUTANEOUS | Status: DC
  Administered 2017-07-15 – 2017-07-23 (×14): via TOPICAL
  Administered 2017-07-23: 1 via TOPICAL
  Administered 2017-07-24 – 2017-07-29 (×11): via TOPICAL
  Filled 2017-07-15 (×2): qty 50

## 2017-07-15 MED ORDER — NYSTATIN 100000 UNIT/ML MT SUSP: 5.0000 mL | Freq: Four times a day (QID) | OROMUCOSAL | 0 refills | Status: DC

## 2017-07-15 MED ORDER — ASCORBIC ACID 250 MG PO TABS: 250.0000 mg | ORAL_TABLET | Freq: Every day | ORAL | 0 refills | Status: DC

## 2017-07-15 MED ORDER — ENOXAPARIN SODIUM 80 MG/0.8ML ~~LOC~~ SOLN: 80.0000 mg | SUBCUTANEOUS | Status: DC

## 2017-07-15 MED ORDER — ADULT MULTIVITAMIN LIQUID CH
15.0000 mL | Freq: Every day | ORAL | Status: DC
  Administered 2017-07-16 – 2017-07-21 (×6): 15 mL
  Filled 2017-07-15 (×6): qty 15

## 2017-07-15 MED ORDER — SODIUM CHLORIDE 0.9% FLUSH
10.0000 mL | INTRAVENOUS | Status: DC | PRN
  Administered 2017-07-15: 20 mL
  Administered 2017-07-16: 30 mL
  Filled 2017-07-15: qty 40

## 2017-07-15 MED ORDER — ENOXAPARIN SODIUM 80 MG/0.8ML ~~LOC~~ SOLN: 80.0000 mg | SUBCUTANEOUS | 0 refills | Status: DC

## 2017-07-15 MED ORDER — POTASSIUM CHLORIDE 20 MEQ/15ML (10%) PO SOLN: 10.0000 meq | Freq: Every day | ORAL | 0 refills | Status: DC

## 2017-07-15 MED ORDER — INSULIN GLARGINE 100 UNIT/ML ~~LOC~~ SOLN: 10.0000 [IU] | Freq: Every day | SUBCUTANEOUS | 11 refills | Status: DC

## 2017-07-15 MED ORDER — POLYETHYLENE GLYCOL 3350 17 G PO PACK: 17.0000 g | PACK | Freq: Every day | ORAL | 0 refills | Status: DC | PRN

## 2017-07-15 MED ORDER — METOPROLOL TARTRATE 5 MG/5ML IV SOLN: 5.0000 mg | Freq: Four times a day (QID) | INTRAVENOUS | 0 refills | Status: DC | PRN

## 2017-07-15 MED ORDER — SILVER NITRATE-POT NITRATE 75-25 % EX MISC: 1.0000 "application " | Freq: Every day | CUTANEOUS | 0 refills | Status: DC | PRN

## 2017-07-15 MED ORDER — DOCUSATE SODIUM 50 MG/5ML PO LIQD
100.0000 mg | Freq: Two times a day (BID) | ORAL | Status: DC
  Administered 2017-07-15 – 2017-07-21 (×11): 100 mg
  Filled 2017-07-15 (×12): qty 10

## 2017-07-15 MED ORDER — COLLAGENASE 250 UNIT/GM EX OINT: TOPICAL_OINTMENT | Freq: Every day | CUTANEOUS | 0 refills | Status: DC

## 2017-07-15 MED ORDER — BISACODYL 10 MG RE SUPP: 10.0000 mg | Freq: Every day | RECTAL | Status: DC | PRN

## 2017-07-15 MED ORDER — INSULIN GLARGINE 100 UNIT/ML ~~LOC~~ SOLN
10.0000 [IU] | Freq: Every day | SUBCUTANEOUS | Status: DC
  Administered 2017-07-15 – 2017-07-28 (×14): 10 [IU] via SUBCUTANEOUS
  Filled 2017-07-15 (×15): qty 0.1

## 2017-07-15 MED ORDER — TORSEMIDE 10 MG PO TABS
10.0000 mg | ORAL_TABLET | Freq: Every day | ORAL | Status: DC
  Administered 2017-07-16 – 2017-07-29 (×14): 10 mg
  Filled 2017-07-15 (×14): qty 1

## 2017-07-15 MED ORDER — POLYETHYLENE GLYCOL 3350 17 G PO PACK
17.0000 g | PACK | Freq: Every day | ORAL | Status: DC | PRN
  Administered 2017-07-18: 17 g via ORAL
  Filled 2017-07-15: qty 1

## 2017-07-15 MED ORDER — ACETAMINOPHEN 325 MG PO TABS
650.0000 mg | ORAL_TABLET | Freq: Four times a day (QID) | ORAL | Status: DC | PRN
  Administered 2017-07-21: 650 mg via ORAL
  Filled 2017-07-15: qty 2

## 2017-07-15 MED ORDER — DOXYCYCLINE HYCLATE 100 MG PO TABS
100.0000 mg | ORAL_TABLET | Freq: Two times a day (BID) | ORAL | Status: DC
  Administered 2017-07-15 – 2017-07-19 (×8): 100 mg via ORAL
  Filled 2017-07-15 (×8): qty 1

## 2017-07-15 MED ORDER — VITAMIN C 500 MG PO TABS
250.0000 mg | ORAL_TABLET | Freq: Every day | ORAL | Status: DC
Start: 2017-07-16 — End: 2017-07-29
  Administered 2017-07-16 – 2017-07-29 (×14): 250 mg
  Filled 2017-07-15 (×14): qty 1

## 2017-07-15 MED ORDER — CHLORHEXIDINE GLUCONATE CLOTH 2 % EX PADS: 6.0000 | MEDICATED_PAD | Freq: Every day | CUTANEOUS | 0 refills | Status: DC

## 2017-07-15 MED ORDER — DOCUSATE SODIUM 50 MG/5ML PO LIQD: 100.0000 mg | Freq: Two times a day (BID) | ORAL | 0 refills | Status: DC

## 2017-07-15 MED ORDER — ONDANSETRON HCL 4 MG/5ML PO SOLN: 4.0000 mg | Freq: Three times a day (TID) | ORAL | 0 refills | Status: DC | PRN

## 2017-07-15 MED ORDER — ONDANSETRON HCL 4 MG/5ML PO SOLN
4.0000 mg | Freq: Three times a day (TID) | ORAL | Status: DC | PRN
  Administered 2017-07-16: 4 mg via ORAL
  Filled 2017-07-15 (×2): qty 5

## 2017-07-15 MED ORDER — FENTANYL CITRATE (PF) 100 MCG/2ML IJ SOLN: 25.0000 ug | INTRAMUSCULAR | 0 refills | Status: DC | PRN

## 2017-07-15 MED ORDER — COLLAGENASE 250 UNIT/GM EX OINT
TOPICAL_OINTMENT | Freq: Every day | CUTANEOUS | Status: DC
  Administered 2017-07-16 – 2017-07-26 (×11): via TOPICAL
  Filled 2017-07-15: qty 30

## 2017-07-15 MED ORDER — NYSTATIN 100000 UNIT/ML MT SUSP
5.0000 mL | Freq: Four times a day (QID) | OROMUCOSAL | Status: DC
  Administered 2017-07-15 – 2017-07-28 (×46): 500000 [IU] via ORAL
  Filled 2017-07-15 (×48): qty 5

## 2017-07-15 MED ORDER — VITAL HIGH PROTEIN PO LIQD
1000.0000 mL | ORAL | Status: DC
  Filled 2017-07-15: qty 1000

## 2017-07-15 MED ORDER — FAMOTIDINE 40 MG/5ML PO SUSR: 20.0000 mg | Freq: Two times a day (BID) | ORAL | 0 refills | Status: DC

## 2017-07-15 MED ORDER — VITAL HIGH PROTEIN PO LIQD: 1000.0000 mL | ORAL | 0 refills | Status: DC

## 2017-07-15 NOTE — Progress Notes (Signed)
Pt DCing to CIR today  No further CSW needs- CSW signing off  Jorge Ny, [place] Social Worker [phone]

## 2017-07-15 NOTE — Progress Notes (Signed)
Inpatient Rehabilitation Admissions Coordinator  I met with patient at bedside and he is in agreement to admit to inpt rehab today for I have bed available. I contacted primary medical team at [phone] and they are in agreement to d/c to CIR today. RN CM and SW are aware. I will make the arrangements to admit today.  Danne Baxter, RN, MSN Rehab Admissions Coordinator [phone] 07/15/2017 11:22 AM

## 2017-07-15 NOTE — Discharge Summary (Addendum)
[name] Medical record number: [id number] Date of birth: [birth year] Age: 57 y.o. Gender: male Date of Admission: 06/09/2017  Date of Discharge: 07/15/2017 Admitting Physician: Belpre Bing, DO  Primary Care Provider: Kerin Perna, NP Consultants: PCCM, Wound care, General Surgery, PT/OT, IR, ENT, PM&R  Indication for Hospitalization: Acute Hypoxic Respiratory Failure  Discharge Diagnoses/Problem List:  Respiratory Failure with Hypercarbia Obesity Hypoventilation syndrome Left groin abscess Stage IV sacral decubitus ulcer Tracheostomy Anemia  Disposition: CIR  Discharge Condition: stable  Discharge Exam: Gen: morbidly obese AA male resting comfortably in bed, NAD Heart: regular rate rhythm, no murmurs/rubs/gallops Lungs: Exam limited by body habitus. Lungs clear to auscultation bilaterally aside from some mildly transmitted upper respiratory sounds Abdomen: obese, NT, ND, Soft. Non-distended. PEG tube in place LLQ. Extremities: no edema or cyanosis Neuro: tracks, follows commands, moves all extremities  Sacrum: stage 4 sacral decubitus ulcer present Left groin: Improving, still small amount of malodorous fluid draining  Brief Hospital Course:  57 year old male admitted on 5/22 for respiratory distress secondary to CHF. Patient also with a history of COPD, OHS, OSA non-compliant with cpap. His BNP was 268 on admission which was likely underestimated due to his weight of 380lbs. He was noted to have subsequent cardiomegaly with vascular congestion and mild pulm edema on initial cxr.   Acute Hypoxic Respiratory Failure  Tracheostomy Patient subsequently decompensated in a stepdown unit which required CCM consult and intubation on 5/22. He was subsequently started on levaquin on 5/22 which was continued until 5/26 for concern for pneumonia. Patient proved to be very difficult to wean off of  the vent and he subsequently had a tracheostomy on 5/28. Patient slowly weaned off of sedation until he was able to tolerated trach collar for very limited periods. At that time he was transferred out to [facility] on 6/12. Patient was kept NPO with tube feeds through an NG tube until 6/17 when it was felt that he was going to need a very long time to fully come off vent at night. He had a PEG placed on 6/17 and continued to receive feeds through that until he was discharged to CIR. Patient was slowly weaned off of trach vent until he was able to tolerated trach collar full time. At that time he had his trach downsized to a #6 uncuffed, which he left to CIR with. After downsizing he underwent a FEES study and was advanced to a dysphagia III diet with thin liquids. His tube feeds were slowly weaned down until 12m/hr which he was on until his discharge. CCM continued to consult and manage trach until dc. He will likely need to lose a lot of weight, and have capnography prior to any thought about removal of trach.  Stage IV sacral ulcer Discovered 2/2 to the patient's immobility in ICU. Surgery consulted and he had a bedside debridement on 6/2. He was found to have e. coli in his wound culture. This was treated with ceftriaxone from 6/8 to 6/14. He continued to have hydrotherapy and santyl placement daily until 6/18. At that time it was felt that he had good granulation tissue and the hydrotherapy was stopped. He continued to get daily santyl and had bid wet to dry dressing changes per wound care recs. Wound care to continue to follow after going to CIR.  Left groin abscess Patient had an area of induration in his left groin which was discovered on 6/18.  He had a soft tissue ultrasound which showed a 3x2x0.5cm abscess. Surgery reconsulted at that time. They did not feel that I&D was necessary. They recommended 10-14 days of broad spectrum abx. Doxycycline chose for best MRSA and anerobic coverage. His doxy will stop  on 7/1. It continued to spontaneously drain purulent fluid until dc.  Type II diabetes Hgb a1c 5.4 on 5/22. Patient received 10U lantus daily. Patient's sugars were generally well controlled during admission ranging from 140-120 daily during last 3 weeks of admission. Patient was on metforming 1g bid prior to admission. Could consider jardiance or victoza on dc to help with weight loss.   Issues for Follow Up:  1. Needs weight loss planning 2. Needs to follow up in tracheostomy clinic 3. Optimization for chronic illnesses Diabetes mellitus 4. Wean tube feeds as tolerated. When tolerating better PO, consider D/C PEG. 5. Consider victoza or jardiance as diabetes med after dc for weight loss assistance  Significant Procedures: Endoctracheal intubation, Debridement of sacral decubitus ulcer, PEG tube placement, Tracheostomy change  Significant Labs and Imaging:  Recent Labs  Lab 07/10/17 0500  WBC 7.4  HGB 9.1*  HCT 32.4*  PLT 233   Recent Labs  Lab 07/10/17 0500  NA 138  K 3.9  CL 96*  CO2 32  GLUCOSE 117*  BUN 22*  CREATININE 0.94  CALCIUM 9.0    Results/Tests Pending at Time of Discharge:   Discharge Medications:  Allergies as of 07/15/2017   No Known Allergies     Medication List    STOP taking these medications   amLODipine 10 MG tablet Commonly known as:  NORVASC   blood glucose meter kit and supplies   furosemide 40 MG tablet Commonly known as:  LASIX   gemfibrozil 600 MG tablet Commonly known as:  LOPID   lisinopril-hydrochlorothiazide 20-25 MG tablet Commonly known as:  PRINZIDE,ZESTORETIC   loratadine 10 MG tablet Commonly known as:  CLARITIN   metFORMIN 500 MG tablet Commonly known as:  GLUCOPHAGE     TAKE these medications   ascorbic acid 250 MG tablet Commonly known as:  VITAMIN C Place 1 tablet (250 mg total) into feeding tube daily. Start taking on:  07/16/2017   atorvastatin 10 MG tablet Commonly known as:  LIPITOR Take 10 mg by  mouth daily.   bisacodyl 10 MG suppository Commonly known as:  DULCOLAX Place 1 suppository (10 mg total) rectally daily as needed for moderate constipation.   Chlorhexidine Gluconate Cloth 2 % Pads Apply 6 each topically daily.   collagenase ointment Commonly known as:  SANTYL Apply topically daily.   docusate 50 MG/5ML liquid Commonly known as:  COLACE Place 10 mLs (100 mg total) into feeding tube 2 (two) times daily.   doxycycline 100 MG tablet Commonly known as:  VIBRA-TABS Take 1 tablet (100 mg total) by mouth every 12 (twelve) hours for 4 days.   enoxaparin 80 MG/0.8ML injection Commonly known as:  LOVENOX Inject 0.8 mLs (80 mg total) into the skin daily.   famotidine 40 MG/5ML suspension Commonly known as:  PEPCID Place 2.5 mLs (20 mg total) into feeding tube 2 (two) times daily.   feeding supplement (VITAL HIGH PROTEIN) Liqd liquid Place 1,000 mLs into feeding tube continuous.   fentaNYL 100 MCG/2ML injection Commonly known as:  SUBLIMAZE Inject 0.5-1 mLs (25-50 mcg total) into the vein every 4 (four) hours as needed for severe pain.   guaiFENesin 600 MG 12 hr tablet Commonly known as:  MUCINEX Take  1 tablet (600 mg total) by mouth 2 (two) times daily.   insulin glargine 100 UNIT/ML injection Commonly known as:  LANTUS Inject 0.1 mLs (10 Units total) into the skin at bedtime. What changed:    how much to take  when to take this   ipratropium-albuterol 0.5-2.5 (3) MG/3ML Soln Commonly known as:  DUONEB Take 3 mLs by nebulization every 4 (four) hours as needed.   lidocaine 2 % jelly Commonly known as:  XYLOCAINE Apply 1 application topically 2 (two) times daily as needed (Can apply to skin around sacral wound to help with pain per wound care recs).   LORazepam 2 MG/ML injection Commonly known as:  ATIVAN Inject 0.25 mLs (0.5 mg total) into the vein every 4 (four) hours as needed for anxiety.   metoprolol tartrate 5 MG/5ML Soln injection Commonly  known as:  LOPRESSOR Inject 5 mLs (5 mg total) into the vein every 6 (six) hours as needed (HR >130).   mineral oil-hydrophilic petrolatum ointment Apply topically 2 (two) times daily.   multivitamin Liqd Place 15 mLs into feeding tube daily. Start taking on:  07/16/2017   nystatin 100000 UNIT/ML suspension Commonly known as:  MYCOSTATIN Take 5 mLs (500,000 Units total) by mouth 4 (four) times daily.   ondansetron 4 MG/5ML solution Commonly known as:  ZOFRAN Take 5 mLs (4 mg total) by mouth every 8 (eight) hours as needed for nausea or vomiting.   polyethylene glycol packet Commonly known as:  MIRALAX / GLYCOLAX Take 17 g by mouth daily as needed for mild constipation.   potassium chloride 20 MEQ/15ML (10%) Soln Take 7.5 mLs (10 mEq total) by mouth daily. Start taking on:  07/16/2017   silver nitrate applicators 15-83 % applicator Apply 1 Stick (1 application total) topically daily as needed for bleeding.   torsemide 10 MG tablet Commonly known as:  DEMADEX Place 1 tablet (10 mg total) into feeding tube daily. Start taking on:  07/16/2017       Discharge Instructions: Please refer to Patient Instructions section of EMR for full details.  Patient was counseled important signs and symptoms that should prompt return to medical care, changes in medications, dietary instructions, activity restrictions, and follow up appointments.   Follow-Up Appointments: Follow-up Information    [place] [facility] Follow up on 09/01/2017.   Specialty:  Respiratory Therapy Why:  trach clinic w/ Marni Griffon ACNP at [facility] information: [address] [id number] [place]          Guadalupe Dawn, MD 07/15/2017, 12:21 PM PGY-1, [place]

## 2017-07-15 NOTE — Progress Notes (Signed)
  Speech Language Pathology Treatment: Dysphagia  Patient Details Name: Timothy Le MRN: [id number] DOB: 12-Apr-1960 Today's Date: 07/15/2017 Time: 5277-8242 SLP Time Calculation (min) (ACUTE ONLY): 10 min  Assessment / Plan / Recommendation Clinical Impression  SLP provided assistance with repositioning for safer PO intake, using pillows and reverse trendelenburg to achieve adequate upright posture. Min cues were provided for use of volitional throat clear, which sometimes elicited a stronger cough response. Pt says although he does cough some during meals, he also coughs throughout the day and he does not notice an increase in frequency with PO intake. Lung sounds remain unchanged; he's afebrile. He also shares that he has been wearing his PMV during all waking hours. Recommend continuing current diet (Dys 3, thin liquids) and precautions. Would continue to provide intermittent supervision with PMV given that he has not been donning/doffing it himself yet. Will continue to address this during future visits.   HPI HPI: 57 yo male admitted with respiratory distress. Intubated 5/23, trach 5/30 . PMH includes morbid obesity, COPD, asthma, OSA, HTN, DM. PEG placed 6/17 (same day as swallow assessment ordered. Repeat FEES to deteremine diet/liquid upgrade.      SLP Plan  Continue with current plan of care       Recommendations  Diet recommendations: Dysphagia 3 (mechanical soft);Thin liquid Liquids provided via: Cup Medication Administration: Whole meds with puree Supervision: Patient able to self feed;Intermittent supervision to cue for compensatory strategies Compensations: Slow rate;Small sips/bites;Clear throat intermittently;Multiple dry swallows after each bite/sip Postural Changes and/or Swallow Maneuvers: Seated upright 90 degrees;Upright 30-60 min after meal      Patient may use Passy-Muir Speech Valve: Intermittently with supervision PMSV Supervision: Intermittent         Oral Care Recommendations: Oral care BID Follow up Recommendations: Inpatient Rehab SLP Visit Diagnosis: Dysphagia, pharyngeal phase (R13.13) Plan: Continue with current plan of care       GO                Germain Osgood 07/15/2017, 10:46 AM  Germain Osgood, M.A. CCC-SLP [phone]

## 2017-07-15 NOTE — Progress Notes (Signed)
Timothy Staggers, MD  Physician  Physical Medicine and Rehabilitation  Consult Note  Signed  Date of Service:  07/13/2017 2:10 PM       Related encounter: ED to Hosp-Admission (Current) from 06/09/2017 in [place] [facility]      Signed      Expand All Collapse All      Show:Clear all _0 Manual_1 Template_2 Copied  Added by: _3 Cathlyn Parsons, PA-C_4 Timothy Staggers, MD   _5 Hover for details        Physical Medicine and Rehabilitation Consult Reason for Consult: Decreased functional mobility Referring Physician: [facility]   HPI: Timothy Le is a 57 y.o. right-handed male with history of asthma/COPD, OSA noncompliant with CPAP, tobacco abuse, morbid obesity with BMI 54.62, diabetes mellitus, hypertension.  Per chart review patient lives alone.  He was using a walker for mobility.  He has friends who will do his grocery shopping.  He does have an aide to assist with some basic ADLs.  One level apartment with 14 steps to entry.  He does have family that check on him.  Presented 06/09/2017 with increasing shortness of breath as well as weight gain.  Patient did require intubation.  Chest x-ray showed low lung volumes with bibasilar atelectasis.  Troponin 0 0.06, urine drug screen negative.  Creatinine 1.27.  Critical care pulmonary services consulted for respiratory failure continue to follow ultimately with tracheostomy performed 06/17/2017 changed to a cuffless #6 07/09/2017.  Speech therapy working with PMV.  Patient did require gastrostomy PEG tube placement for nutritional support 07/05/2017.  Currently on a dysphagia #3 thin liquid diet.  Subcutaneous heparin for DVT prophylaxis.  Wound care nurse follow-up for sacral wound with dressing care as directed.  Therapies have continued to follow with therapy evaluation completed 07/13/2017 considering inpatient rehab services for debilitation multi-medical.   Review of Systems  Constitutional:   Weight gain  HENT: Negative for hearing loss.   Eyes: Negative for blurred vision and double vision.  Respiratory: Positive for cough and shortness of breath.   Cardiovascular: Positive for leg swelling.  Gastrointestinal: Positive for constipation. Negative for nausea and vomiting.  Musculoskeletal: Positive for joint pain and myalgias.  Skin: Negative for rash.  Neurological: Negative for seizures.  All other systems reviewed and are negative.      Past Medical History:  Diagnosis Date  . Asthma   . Chronic respiratory failure with hypoxia ([place]) 07/09/2017  . COPD (chronic obstructive pulmonary disease) ([place])   . Diabetes mellitus without complication ([place])   . Hypertension   . Shortness of breath dyspnea   . Sleep apnea         Past Surgical History:  Procedure Laterality Date  . IR GASTROSTOMY TUBE MOD SED  07/05/2017  . TRACHEOSTOMY TUBE PLACEMENT N/A 06/17/2017   Procedure: TRACHEOSTOMY;  Surgeon: Leta Baptist, MD;  Location: [facility] OR;  Service: ENT;  Laterality: N/A;        Family History  Problem Relation Age of Onset  . Asthma Sister   . Asthma Other        nephew  . Hypertension Sister   . Diabetes type II Sister    Social History:  reports that he quit smoking about 4 years ago. His smoking use included cigarettes. He started smoking about 33 years ago. He has a 0.25 pack-year smoking history. He has never used smokeless tobacco. He reports that he does not drink alcohol or use drugs. Allergies: No Known Allergies  Medications Prior to Admission  Medication Sig Dispense Refill  . amLODipine (NORVASC) 10 MG tablet Take 10 mg by mouth daily.    Marland Kitchen atorvastatin (LIPITOR) 10 MG tablet Take 10 mg by mouth daily.    . furosemide (LASIX) 40 MG tablet Take 1 tablet (40 mg total) by mouth 2 (two) times daily. 60 tablet 2  . gemfibrozil (LOPID) 600 MG tablet Take 600 mg by mouth 2 (two) times daily before a meal. Thirty minutes before morning and evening  meal.    . guaiFENesin (MUCINEX) 600 MG 12 hr tablet Take 1 tablet (600 mg total) by mouth 2 (two) times daily. 40 tablet 0  . insulin glargine (LANTUS) 100 UNIT/ML injection Inject 0.6 mLs (60 Units total) into the skin daily. 10 mL 11  . lisinopril-hydrochlorothiazide (PRINZIDE,ZESTORETIC) 20-25 MG tablet Take 1 tablet by mouth daily.    Marland Kitchen loratadine (CLARITIN) 10 MG tablet 1 daily for allergy while needed (Patient taking differently: Take 10 mg by mouth daily as needed for allergies. ) 30 tablet 11  . metFORMIN (GLUCOPHAGE) 500 MG tablet Take 1,000 mg by mouth 2 (two) times daily with a meal.    . blood glucose meter kit and supplies Dispense based on patient and insurance preference. Use up to four times daily as directed. (FOR ICD-9 250.00, 250.01). 1 each 0    Home: Home Living Family/patient expects to be discharged to:: [place]: Non-relatives/Friends Additional Comments: Pt was living alone.  Functional History: Prior Function Level of Independence: Independent, Independent with assistive device(s) Comments: does have friends who go grocery shopping, aide to assist with bathing, was using a RW Functional Status:  Mobility: Bed Mobility Overal bed mobility: Needs Assistance Bed Mobility: Supine to Sit, Sit to Supine Supine to sit: Max assist, +2 for physical assistance Sit to supine: Min assist, +2 for physical assistance General bed mobility comments: due to body habitus and air mattress pt required maxA for trunk elevation and to bring LEs off EOB, pt able to return self to L sidelying with use of bed rail and minA for LEs. Transfers Overall transfer level: Needs assistance Equipment used: Rolling walker (2 wheeled) Transfers: Sit to/from Stand Sit to Stand: Mod assist, +2 physical assistance General transfer comment: v/c's to push up from the bed and chair, modAX2 to steady pt during transition of hands as patient very unsteady. Pt  then very dependent on RW Ambulation/Gait Ambulation/Gait assistance: Mod assist, +2 safety/equipment Gait Distance (Feet): 5 Feet(x2) Assistive device: Rolling walker (2 wheeled) Gait Pattern/deviations: Step-to pattern, Decreased stride length, Wide base of support General Gait Details: labored effort, rest break every 3 steps, pt with report "my legs are so weak, I'm going to fall" Gait velocity: slow Gait velocity interpretation: <1.8 ft/sec, indicate of risk for recurrent falls  ADL:  Cognition: Cognition Overall Cognitive Status: Within Functional Limits for tasks assessed Orientation Level: Oriented to person, Oriented to place, Oriented to situation Cognition Arousal/Alertness: Awake/alert Behavior During Therapy: WFL for tasks assessed/performed Overall Cognitive Status: Within Functional Limits for tasks assessed  Blood pressure 135/84, pulse 91, temperature 97.7 F (36.5 C), temperature source Oral, resp. rate (!) 25, height _0  (1.676 m), weight (!) 153.5 kg (338 lb 6.5 oz), SpO2 98 %. Physical Exam  Constitutional: He is oriented to person, place, and time.  Obese male side-lying in bed  HENT:  Head: Normocephalic.  Eyes: Pupils are equal, round, and reactive to light.  Neck:  #6 trach with PMV,  decreased phonation  Cardiovascular:  tachy  Respiratory: Effort normal.  GI: Soft.  Musculoskeletal: Normal range of motion.  Neurological: He is alert and oriented to person, place, and time. No cranial nerve deficit.  Fair insight and awareness. Language intact. Motor 4/5 UE and LE prox to distal. No sensory findings    LabResultsLast24Hours       Results for orders placed or performed during the hospital encounter of 06/09/17 (from the past 24 hour(s))  Glucose, capillary     Status: Abnormal   Collection Time: 07/12/17  5:07 PM  Result Value Ref Range   Glucose-Capillary 126 (H) 65 - 99 mg/dL  Glucose, capillary     Status: Abnormal   Collection  Time: 07/12/17 10:03 PM  Result Value Ref Range   Glucose-Capillary 133 (H) 65 - 99 mg/dL  Glucose, capillary     Status: Abnormal   Collection Time: 07/13/17  7:54 AM  Result Value Ref Range   Glucose-Capillary 122 (H) 70 - 99 mg/dL  Glucose, capillary     Status: Abnormal   Collection Time: 07/13/17 12:15 PM  Result Value Ref Range   Glucose-Capillary 140 (H) 70 - 99 mg/dL     ImagingResults(Last48hours)  No results found.     Assessment/Plan: Diagnosis: 57 yo morbidly obese male with debility related VDRf, multiple medical  1. Does the need for close, 24 hr/day medical supervision in concert with the patient's rehab needs make it unreasonable for this patient to be served in a less intensive setting? Yes 2. Co-Morbidities requiring supervision/potential complications: trach mgt, OSA, skin care considerations 3. Due to bladder management, bowel management, safety, skin/wound care, disease management, medication administration, pain management and patient education, does the patient require 24 hr/day rehab nursing? Yes 4. Does the patient require coordinated care of a physician, rehab nurse, PT (1-2 hrs/day, 5 days/week), OT (1-2 hrs/day, 5 days/week) and SLP (1-2 hrs/day, 5 days/week) to address physical and functional deficits in the context of the above medical diagnosis(es)? Yes Addressing deficits in the following areas: balance, endurance, locomotion, strength, transferring, bowel/bladder control, bathing, dressing, feeding, grooming, toileting, speech and psychosocial support 5. Can the patient actively participate in an intensive therapy program of at least 3 hrs of therapy per day at least 5 days per week? Yes 6. The potential for patient to make measurable gains while on inpatient rehab is excellent 7. Anticipated functional outcomes upon discharge from inpatient rehab are modified independent  with PT, modified independent and supervision with OT, modified  independent with SLP. 8. Estimated rehab length of stay to reach the above functional goals is: 8-13 days 9. Anticipated D/C setting: Home 10. Anticipated post D/C treatments: [place] therapy 11. Overall Rehab/Functional Prognosis: excellent  RECOMMENDATIONS: This patient's condition is appropriate for continued rehabilitative care in the following setting: CIR Patient has agreed to participate in recommended program. Yes Note that insurance prior authorization may be required for reimbursement for recommended care.  Comment: Rehab Admissions Coordinator to follow up.  Thanks,  Timothy Staggers, MD, Timothy Le  I have personally performed a face to face diagnostic evaluation of this patient. Additionally, I have reviewed and concur with the physician assistant's documentation above.     Lavon Paganini Angiulli, PA-C 07/13/2017          Revision History                        Routing History

## 2017-07-15 NOTE — Care Management Note (Signed)
Case Management Note  Patient Details  Name: KARLIN HEILMAN MRN: [id number] Date of Birth: Jun 17, 1960  Subjective/Objective:  DC to CIR today.                   Action/Plan: DC to CIR today.  Expected Discharge Date:  07/15/17               Expected Discharge Plan:  IP Rehab Facility  In-House Referral:  Clinical Social Work  Discharge planning Services  CM Consult  Post Acute Care Choice:    Choice offered to:     DME Arranged:    DME Agency:     HH Arranged:    [place] Agency:     Status of Service:  Completed, signed off  If discussed at [employer], dates discussed:    Additional Comments:  Zenon Mayo, RN 07/15/2017, 12:54 PM

## 2017-07-15 NOTE — PMR Pre-admission (Addendum)
PMR Admission Coordinator Pre-Admission Assessment  Patient: Timothy Le is an 57 y.o., male MRN: [id number] DOB: 08-21-1960 Height: 5\' 6"  (167.6 cm) Weight: (!) 172.4 kg (380 lb)(pt is on air mattress and weight has been way off last few )              Insurance Information HMO:     PPO:      PCP:      IPA:      80/20:      OTHER:   PRIMARY: Medicaid [place] [employer]      Policy#: [id number]      Subscriber: pt Benefits:  Phone #: [phone]     Name: 6/27 Eff. Date: active [facility]     Medicaid Application Date:       Case Manager:  Disability Application Date:       Case Worker:   Emergency Contact Information Contact Information    Name Relation Home Work Mobile   Sheldon Son   [phone]   Nelwyn Salisbury Niece   [phone]   Lonia Chimera   [phone]     Current Medical History  Patient Admitting Diagnosis: debility History of Present Illness:              : HPI: Timothy Le is a 57 year old right-handed male with history of asthma/COPD, OSA noncompliant with CPAP, tobacco abuse, morbid obesity with BMI 51.81, diabetes mellitus and hypertension.  Presented 06/09/2017 with increasing shortness of breath as well as progressive weight gain.  Patient did require intubation.  Chest x-ray showed lung volumes with bibasilar atelectasis.  Troponin  0.06, urine drug screen negative, creatinine 1.27.  Critical care pulmonary services consulted for respiratory failure continue to follow difficult weaning from ventilator ultimately underwent tracheostomy 06/17/2017 recently changed to a cuff less #6 XLT 07/09/2017.  Speech therapy working with PMV.  No plan for decannulation until weight loss.  Patient did require gastrostomy tube placement for nutritional support 07/05/2017.  Diet has been advanced to a mechanical soft thin liquid diet.  Subcutaneous Lovenox for DVT prophylaxis.  Wound care nurse follow-up for sacral wound with dressing care as directed.         Past Medical History  Past Medical History:  Diagnosis Date  . Asthma   . Chronic respiratory failure with hypoxia ([place]) 07/09/2017  . COPD (chronic obstructive pulmonary disease) ([place])   . Diabetes mellitus without complication ([place])   . Hypertension   . Shortness of breath dyspnea   . Sleep apnea     Family History  family history includes Asthma in his other and sister; Diabetes type II in his sister; Hypertension in his sister.  Prior Rehab/Hospitalizations:  Has the patient had major surgery during 100 days prior to admission? No  Current Medications   Current Facility-Administered Medications:  .  0.9 %  sodium chloride infusion, 250 mL, Intravenous, PRN, Collene Gobble, MD, Last Rate: 10 mL/hr at 07/05/17 0600 .  bisacodyl (DULCOLAX) suppository 10 mg, 10 mg, Rectal, Daily PRN, Collene Gobble, MD, 10 mg at 06/15/17 1114 .  chlorhexidine (PERIDEX) 0.12 % solution 15 mL, 15 mL, Mouth Rinse, BID, Dickie La, MD, 15 mL at 07/15/17 1039 .  Chlorhexidine Gluconate Cloth 2 % PADS 6 each, 6 each, Topical, Daily, Chesley Mires, MD, 6 each at 07/13/17 [phone] .  collagenase (SANTYL) ointment, , Topical, Daily, Dickie La, MD .  docusate (COLACE) 50 MG/5ML liquid 100 mg, 100 mg, Per Tube, BID, Byrum,  Rose Fillers, MD, 100 mg at 07/15/17 1032 .  doxycycline (VIBRA-TABS) tablet 100 mg, 100 mg, Oral, Q12H, Guadalupe Dawn, MD, 100 mg at 07/15/17 1032 .  enoxaparin (LOVENOX) injection 80 mg, 80 mg, Subcutaneous, Q24H, Dickie La, MD .  famotidine (PEPCID) 40 MG/5ML suspension 20 mg, 20 mg, Per Tube, BID, Collene Gobble, MD, 20 mg at 07/15/17 1033 .  feeding supplement (VITAL HIGH PROTEIN) liquid 1,000 mL, 1,000 mL, Per Tube, Continuous, Guadalupe Dawn, MD, Last Rate: 20 mL/hr at 07/14/17 2253, 1,000 mL at 07/14/17 2253 .  fentaNYL (SUBLIMAZE) injection 25-50 mcg, 25-50 mcg, Intravenous, Q4H PRN, Collene Gobble, MD, 50 mcg at 07/15/17 1047 .  insulin glargine (LANTUS) injection 10 Units,  10 Units, Subcutaneous, QHS, Collene Gobble, MD, 10 Units at 07/14/17 2235 .  ipratropium-albuterol (DUONEB) 0.5-2.5 (3) MG/3ML nebulizer solution 3 mL, 3 mL, Nebulization, Q4H PRN, Sood, Vineet, MD .  lidocaine (XYLOCAINE) 2 % jelly 1 application, 1 application, Topical, BID PRN, Guadalupe Dawn, MD, 1 application at 38/10/17 1340 .  LORazepam (ATIVAN) injection 0.5 mg, 0.5 mg, Intravenous, Q4H PRN, Collene Gobble, MD, 0.5 mg at 06/27/17 2146 .  MEDLINE mouth rinse, 15 mL, Mouth Rinse, q12n4p, Dickie La, MD, 15 mL at 07/14/17 1402 .  metoprolol tartrate (LOPRESSOR) injection 5 mg, 5 mg, Intravenous, Q6H PRN, Chesley Mires, MD, 5 mg at 07/01/17 0158 .  mineral oil-hydrophilic petrolatum (AQUAPHOR) ointment, , Topical, BID, Winfrey, Amanda C, MD .  multivitamin liquid 15 mL, 15 mL, Per Tube, Daily, Dickie La, MD, 15 mL at 07/15/17 1033 .  nystatin (MYCOSTATIN) 100000 UNIT/ML suspension 500,000 Units, 5 mL, Oral, QID, Winfrey, Alcario Drought, MD, 500,000 Units at 07/15/17 1039 .  ondansetron (ZOFRAN) 4 MG/5ML solution 4 mg, 4 mg, Oral, Q8H PRN, Lockamy, Timothy, DO .  polyethylene glycol (MIRALAX / GLYCOLAX) packet 17 g, 17 g, Oral, Daily PRN, Collene Gobble, MD, 17 g at 06/10/17 1653 .  potassium chloride 20 MEQ/15ML (10%) solution 10 mEq, 10 mEq, Oral, Daily, Winfrey, Amanda C, MD, 10 mEq at 07/15/17 1032 .  RESOURCE THICKENUP CLEAR, , Oral, PRN, Dickie La, MD .  silver nitrate applicators applicator 1 application, 1 application, Topical, Daily PRN, Byrum, Rose Fillers, MD .  sodium chloride flush (NS) 0.9 % injection 10-40 mL, 10-40 mL, Intracatheter, PRN, Collene Gobble, MD, 10 mL at 07/06/17 2200 .  sodium chloride flush (NS) 0.9 % injection 10-40 mL, 10-40 mL, Intracatheter, Q12H, Sood, Vineet, MD, 10 mL at 07/15/17 1040 .  sodium chloride flush (NS) 0.9 % injection 10-40 mL, 10-40 mL, Intracatheter, PRN, Chesley Mires, MD, 40 mL at 07/05/17 1210 .  sodium chloride flush (NS) 0.9 % injection 3  mL, 3 mL, Intravenous, Q12H, Byrum, Rose Fillers, MD, 3 mL at 07/15/17 1041 .  sodium chloride flush (NS) 0.9 % injection 3 mL, 3 mL, Intravenous, PRN, Collene Gobble, MD, 3 mL at 07/12/17 2259 .  torsemide (DEMADEX) tablet 10 mg, 10 mg, Per Tube, Daily, Collene Gobble, MD, 10 mg at 07/15/17 1031 .  vitamin C (ASCORBIC ACID) tablet 250 mg, 250 mg, Per Tube, Daily, Guadalupe Dawn, MD, 250 mg at 07/15/17 1032  Patients Current Diet:  Diet Order           Diet - low sodium heart healthy        DIET DYS 3 Room service appropriate? Yes; Fluid consistency: Thin  Diet effective now  Precautions / Restrictions Precautions Precautions: Fall Precaution Comments: trach, morbid obesity Restrictions Weight Bearing Restrictions: No   Has the patient had 2 or more falls or a fall with injury in the past year?Yes  Prior Activity Level Limited Community (1-2x/wk): second floor apartment with 14 step entry; & and &(Has not been out of apartment for months)  [place] [facility] [place] Devices/Equipment: Cane (specify quad or straight), CPAP, Hospital bed, Walker (specify type)  Prior Device Use: Indicate devices/aids used by the patient prior to current illness, exacerbation or injury? Walker  Prior Functional Level Prior Function Level of Independence: (mod I with RW in apartment, aide to asisst with bathing, spo) Gait / Transfers Assistance Needed: Mod I with RW ADL's / Homemaking Assistance Needed: independent for basic ADL but assistance for IADL; has not been able to leave his home Comments: Has friends and family to assist with grocery shopping and IADL.   Self Care: Did the patient need help bathing, dressing, using the toilet or eating?  needed some help; had aide 3 times per week to assist  Indoor Mobility: Did the patient need assistance with walking from room to room (with or without device)? Independent  Stairs: Did the patient need assistance with  internal or external stairs (with or without device)? Dependent  Functional Cognition: Did the patient need help planning regular tasks such as shopping or remembering to take medications? Dependent  Current Functional Level Cognition  Overall Cognitive Status: Within Functional Limits for tasks assessed Orientation Level: Oriented X4    Extremity Assessment (includes Sensation/Coordination)  Upper Extremity Assessment: Overall WFL for tasks assessed  Lower Extremity Assessment: Generalized weakness    ADLs  Overall ADL's : Needs assistance/impaired Eating/Feeding: Supervision/ safety, Bed level Grooming: Minimal assistance, Sitting Upper Body Bathing: Moderate assistance, Sitting Lower Body Bathing: Sitting/lateral leans, Total assistance Upper Body Dressing : Moderate assistance, Sitting Lower Body Dressing: Total assistance, Sitting/lateral leans General ADL Comments: Pt able to sit at EOB for ADL participation this session. He was additionally able to scoot toward [facility] with min assist 3x.     Mobility  Overal bed mobility: Needs Assistance Bed Mobility: Supine to Sit, Sit to Supine Supine to sit: Max assist Sit to supine: Max assist General bed mobility comments: max assist for management of bed mobility due to body habitus and air mattress    Transfers  Overall transfer level: Needs assistance Equipment used: Rolling walker (2 wheeled) Transfers: Sit to/from Stand Sit to Stand: Mod assist, +2 physical assistance General transfer comment: Able to complete 3 lateral scoots at EOB to reposition in bed with min assist    Ambulation / Gait / Stairs / Wheelchair Mobility  Ambulation/Gait Ambulation/Gait assistance: Mod assist, +2 safety/equipment Gait Distance (Feet): 5 Feet(x2) Assistive device: Rolling walker (2 wheeled) Gait Pattern/deviations: Step-to pattern, Decreased stride length, Wide base of support General Gait Details: labored effort, rest break every 3 steps, pt  with report "my legs are so weak, I'm going to fall" Gait velocity: slow Gait velocity interpretation: <1.8 ft/sec, indicate of risk for recurrent falls    Posture / Balance Dynamic Sitting Balance Sitting balance - Comments: supervision at EOB Balance Overall balance assessment: Needs assistance Sitting-balance support: Feet supported, Bilateral upper extremity supported Sitting balance-Leahy Scale: Fair Sitting balance - Comments: supervision at EOB Standing balance support: Bilateral upper extremity supported Standing balance-Leahy Scale: Poor Standing balance comment: dependent on RW    Special needs/care consideration BiPAP/CPAP yes but does not use CPM  n/a Continuous Drip IV n/a Dialysis n/a Life Vest n/a Oxygen 28 % trach collar Special Bed Bari special bed Trach Size # 6 uncuffed XL Wound Vac n/a Skin PEG site; PIC line;[x] Meryle Ready, RN   [] Hover for details   WOC Nurse wound follow up Assessment completed in [facility] 2w22. 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 Drainage (amount, consistency, odor). Heavy amount on existing dressing.  One moist 4 x 4 gauze was present in the wound bed. Peri wound: Intact, normal color and texture Dressing procedure/placement/frequency: Saline moistened gauze twice daily, Santyl once daily.  I have modified the dressing instructions to include using enough kerlex to cover the entire wound bed and lightly fill the wound.  I have also added to order and place a Size wise bed with an air mattress, and to turn the patient every 2 hours.  The wound associated with the trach face plate is almost healed.  What I saw was a very small area on the patient's right side at the corner of the face plate.  A split gauze dressing was in place and padding the site.  Monitor the wound area(s) for worsening of  condition such as: Signs/symptoms of infection,  Increase in size,  Development of or worsening of odor, Development of pain, or increased pain at the affected locations.  Notify the medical team if any of these develop.  Val Riles, RN, MSN, CWOCN, CNS-BC, pager [phone]    Bowel mgmt: continent 6/26 Bladder mgmt: continent Diabetic mgmt yes   Previous Home Environment Living Arrangements: Alone  Lives With: Alone Available Help at Discharge: Available PRN/intermittently Type of Home: Apartment Home Layout: One level Home Access: Stairs to enter Entrance Stairs-Rails: Right, Left, Can reach both Entrance Stairs-Number of Steps: 14 total; 7 steps then landing and then 7 steps Bathroom Shower/Tub: Tub/shower unit, Curtain(sponge bath sitting on toilet) Bathroom Toilet: Standard Bathroom Accessibility: Yes How Accessible: Accessible via walker [place]: Yes Type of Home Care Services: Homehealth aide, [place] (if known): [facility] Additional Comments: niece checks on him daily  Discharge Living Setting Plans for Discharge Living Setting: Alone, Apartment(moving to first level aparmtent; same complex) Type of Home at Discharge: Apartment Discharge Home Layout: One level Discharge Home Access: (moving within 2 weeks to first floor apartment; same complex) Discharge Bathroom Shower/Tub: Tub/shower unit, Curtain Discharge Bathroom Toilet: Standard Discharge Bathroom Accessibility: Yes How Accessible: Accessible via walker Does the patient have any problems obtaining your medications?: No  Social/Family/Support Systems Patient Roles: Parent(19 year old son) Contact Information: niece and sister are main contact; not his son Anticipated Caregiver: niece and sister Anticipated Caregiver's Contact Information: see above Ability/Limitations of Caregiver: prn only Caregiver Availability: Intermittent Discharge Plan Discussed with  Primary Caregiver: Yes Is Caregiver In Agreement with Plan?: Yes Does Caregiver/Family have Issues with Lodging/Transportation while Pt is in Rehab?: No  Patient needs new hospital bed for home that will be appropriate for his weight and size  Goals/Additional Needs Patient/Family Goal for Rehab: Mod I with PT, Mod I to supervision OT, mod I SLP Expected length of stay: [facility] 8 to 13 days; family requesting 2 weeks to get him moved to first level  Equipment Needs: Trach new this admit and is longterm trach fro d/c home Special Service Needs: Need larger hospital  bed at home for his new weight(gained 100 lbs in past year) Pt/Family Agrees to Admission and willing to participate: Yes Program Orientation Provided & Reviewed with Pt/Caregiver Including Roles  & Responsibilities: Yes  Barriers to Discharge: Inaccessible home environment, Decreased caregiver support, Wound Care, Weight   Decrease burden of Care through IP rehab admission: n/a  Possible need for SNF placement upon discharge: no; niece assisting today with making arrangements to get patient moved to first level apartment within same complex. She is taking paperwork on 6/28 to his housing caseworker to sign off and apartment is ready for them to move in.   Patient Condition: This patient's condition remains as documented in the consult dated 6/25, in which the Rehabilitation Physician determined and documented that the patient's condition is appropriate for intensive rehabilitative care in an inpatient rehabilitation facility. Will admit to inpatient rehab today.  Preadmission Screen Completed By:  Cleatrice Burke, 07/15/2017 12:45 PM ______________________________________________________________________   Discussed status with Dr. Naaman Plummer on 07/15/2017 at  1243 and received telephone approval for admission today.  Admission Coordinator:  Cleatrice Burke, time [id number] Date 07/15/2017

## 2017-07-15 NOTE — Progress Notes (Signed)
Cristina Gong, RN  Rehab Admission Coordinator  Physical Medicine and Rehabilitation  PMR Pre-admission  Addendum  Date of Service:  07/15/2017 12:34 PM       Related encounter: ED to Hosp-Admission (Current) from 06/09/2017 in [place] [facility] [place] [facility]           Show:Clear all [x] Manual[x] Template[x] Copied  Added by: [x] Cristina Gong, RN   [] Hover for details   PMR Admission Coordinator Pre-Admission Assessment  Patient: Timothy Le is an 57 y.o., male MRN: [id number] DOB: 1960/03/06 Height: 5\' 6"  (167.6 cm) Weight: (!) 172.4 kg (380 lb)(pt is on air mattress and weight has been way off last few )                                                                                                                                                  Insurance Information HMO:     PPO:      PCP:      IPA:      80/20:      OTHER:   PRIMARY: Medicaid [place] Access      Policy#: [id number]      Subscriber: pt Benefits:  Phone #: [phone]     Name: 6/27 Eff. Date: active [facility]     Medicaid Application Date:       Case Manager:  Disability Application Date:       Case Worker:   Emergency Contact Information         Contact Information    Name Relation Home Work Mobile   [place] Son   [phone]   [place] Niece   [phone]   Lonia Chimera   [phone]     Current Medical History  Patient Admitting Diagnosis: debility History of Present Illness:             : KXF:GHWEX Timothy Le is a 57 year old right-handed male with history of asthma/COPD, OSA noncompliant with CPAP, tobacco abuse, morbid obesity with BMI 51.81, diabetes mellitus and hypertension. Presented 06/09/2017 with increasing shortness of breath as well as progressive weight gain. Patient did require intubation. Chest x-ray showed lung volumes with bibasilar atelectasis. Troponin 0.06, urine drug screen negative, creatinine 1.27.  Critical care pulmonary services consulted for respiratory failure continue to follow difficult weaning from ventilator ultimately underwent tracheostomy 06/17/2017 recently changed to a cuff less #6 XLT 07/09/2017. Speech therapy working with PMV. No plan for decannulation until weight loss. Patient did require gastrostomy tube placement for nutritional support 07/05/2017. Diet has been advanced to a mechanical soft thin liquid diet. Subcutaneous Lovenox for DVT prophylaxis. Wound care nurse follow-up for sacral wound with dressing care as directed.   Past Medical History      Past Medical History:  Diagnosis Date  . Asthma   . Chronic respiratory failure with hypoxia ([place]) 07/09/2017  .  COPD (chronic obstructive pulmonary disease) ([place])   . Diabetes mellitus without complication ([place])   . Hypertension   . Shortness of breath dyspnea   . Sleep apnea     Family History  family history includes Asthma in his other and sister; Diabetes type II in his sister; Hypertension in his sister.  Prior Rehab/Hospitalizations:  Has the patient had major surgery during 100 days prior to admission? No  Current Medications   Current Facility-Administered Medications:  .  0.9 %  sodium chloride infusion, 250 mL, Intravenous, PRN, Collene Gobble, MD, Last Rate: 10 mL/hr at 07/05/17 0600 .  bisacodyl (DULCOLAX) suppository 10 mg, 10 mg, Rectal, Daily PRN, Collene Gobble, MD, 10 mg at 06/15/17 1114 .  chlorhexidine (PERIDEX) 0.12 % solution 15 mL, 15 mL, Mouth Rinse, BID, Dickie La, MD, 15 mL at 07/15/17 1039 .  Chlorhexidine Gluconate Cloth 2 % PADS 6 each, 6 each, Topical, Daily, Chesley Mires, MD, 6 each at 07/13/17 [phone]  collagenase (SANTYL) ointment, , Topical, Daily, Dickie La, MD .  docusate (COLACE) 50 MG/5ML liquid 100 mg, 100 mg, Per Tube, BID, Collene Gobble, MD, 100 mg at 07/15/17 1032 .  doxycycline (VIBRA-TABS) tablet 100 mg, 100 mg, Oral, Q12H, Guadalupe Dawn, MD,  100 mg at 07/15/17 1032 .  enoxaparin (LOVENOX) injection 80 mg, 80 mg, Subcutaneous, Q24H, Dickie La, MD .  famotidine (PEPCID) 40 MG/5ML suspension 20 mg, 20 mg, Per Tube, BID, Collene Gobble, MD, 20 mg at 07/15/17 1033 .  feeding supplement (VITAL HIGH PROTEIN) liquid 1,000 mL, 1,000 mL, Per Tube, Continuous, Guadalupe Dawn, MD, Last Rate: 20 mL/hr at 07/14/17 2253, 1,000 mL at 07/14/17 2253 .  fentaNYL (SUBLIMAZE) injection 25-50 mcg, 25-50 mcg, Intravenous, Q4H PRN, Collene Gobble, MD, 50 mcg at 07/15/17 1047 .  insulin glargine (LANTUS) injection 10 Units, 10 Units, Subcutaneous, QHS, Collene Gobble, MD, 10 Units at 07/14/17 2235 .  ipratropium-albuterol (DUONEB) 0.5-2.5 (3) MG/3ML nebulizer solution 3 mL, 3 mL, Nebulization, Q4H PRN, Sood, Vineet, MD .  lidocaine (XYLOCAINE) 2 % jelly 1 application, 1 application, Topical, BID PRN, Guadalupe Dawn, MD, 1 application at 88/50/27 1340 .  LORazepam (ATIVAN) injection 0.5 mg, 0.5 mg, Intravenous, Q4H PRN, Collene Gobble, MD, 0.5 mg at 06/27/17 2146 .  MEDLINE mouth rinse, 15 mL, Mouth Rinse, q12n4p, Dickie La, MD, 15 mL at 07/14/17 1402 .  metoprolol tartrate (LOPRESSOR) injection 5 mg, 5 mg, Intravenous, Q6H PRN, Chesley Mires, MD, 5 mg at 07/01/17 0158 .  mineral oil-hydrophilic petrolatum (AQUAPHOR) ointment, , Topical, BID, Winfrey, Amanda C, MD .  multivitamin liquid 15 mL, 15 mL, Per Tube, Daily, Dickie La, MD, 15 mL at 07/15/17 1033 .  nystatin (MYCOSTATIN) 100000 UNIT/ML suspension 500,000 Units, 5 mL, Oral, QID, Winfrey, Alcario Drought, MD, 500,000 Units at 07/15/17 1039 .  ondansetron (ZOFRAN) 4 MG/5ML solution 4 mg, 4 mg, Oral, Q8H PRN, Lockamy, Timothy, DO .  polyethylene glycol (MIRALAX / GLYCOLAX) packet 17 g, 17 g, Oral, Daily PRN, Collene Gobble, MD, 17 g at 06/10/17 1653 .  potassium chloride 20 MEQ/15ML (10%) solution 10 mEq, 10 mEq, Oral, Daily, Winfrey, Amanda C, MD, 10 mEq at 07/15/17 1032 .  RESOURCE THICKENUP CLEAR,  , Oral, PRN, Dickie La, MD .  silver nitrate applicators applicator 1 application, 1 application, Topical, Daily PRN, Byrum, Rose Fillers, MD .  sodium chloride flush (NS) 0.9 % injection 10-40 mL, 10-40  mL, Intracatheter, PRN, Collene Gobble, MD, 10 mL at 07/06/17 2200 .  sodium chloride flush (NS) 0.9 % injection 10-40 mL, 10-40 mL, Intracatheter, Q12H, Sood, Vineet, MD, 10 mL at 07/15/17 1040 .  sodium chloride flush (NS) 0.9 % injection 10-40 mL, 10-40 mL, Intracatheter, PRN, Chesley Mires, MD, 40 mL at 07/05/17 1210 .  sodium chloride flush (NS) 0.9 % injection 3 mL, 3 mL, Intravenous, Q12H, Byrum, Rose Fillers, MD, 3 mL at 07/15/17 1041 .  sodium chloride flush (NS) 0.9 % injection 3 mL, 3 mL, Intravenous, PRN, Collene Gobble, MD, 3 mL at 07/12/17 2259 .  torsemide (DEMADEX) tablet 10 mg, 10 mg, Per Tube, Daily, Collene Gobble, MD, 10 mg at 07/15/17 1031 .  vitamin C (ASCORBIC ACID) tablet 250 mg, 250 mg, Per Tube, Daily, Guadalupe Dawn, MD, 250 mg at 07/15/17 1032  Patients Current Diet:       Diet Order           Diet - low sodium heart healthy        DIET DYS 3 Room service appropriate? Yes; Fluid consistency: Thin  Diet effective now          Precautions / Restrictions Precautions Precautions: Fall Precaution Comments: trach, morbid obesity Restrictions Weight Bearing Restrictions: No   Has the patient had 2 or more falls or a fall with injury in the past year?Yes  Prior Activity Level Limited Community (1-2x/wk): second floor apartment with 14 step entry; & and &(Has not been out of apartment for months)  [place] Devices/Equipment: Cane (specify quad or straight), CPAP, Hospital bed, Walker (specify type)  Prior Device Use: Indicate devices/aids used by the patient prior to current illness, exacerbation or injury? Walker  Prior Functional Level Prior Function Level of Independence: (mod I with RW in apartment,  aide to asisst with bathing, spo) Gait / Transfers Assistance Needed: Mod I with RW ADL's / Homemaking Assistance Needed: independent for basic ADL but assistance for IADL; has not been able to leave his home Comments: Has friends and family to assist with grocery shopping and IADL.   Self Care: Did the patient need help bathing, dressing, using the toilet or eating?  needed some help; had aide 3 times per week to assist  Indoor Mobility: Did the patient need assistance with walking from room to room (with or without device)? Independent  Stairs: Did the patient need assistance with internal or external stairs (with or without device)? Dependent  Functional Cognition: Did the patient need help planning regular tasks such as shopping or remembering to take medications? Dependent  Current Functional Level Cognition  Overall Cognitive Status: Within Functional Limits for tasks assessed Orientation Level: Oriented X4    Extremity Assessment (includes Sensation/Coordination)  Upper Extremity Assessment: Overall WFL for tasks assessed  Lower Extremity Assessment: Generalized weakness    ADLs  Overall ADL's : Needs assistance/impaired Eating/Feeding: Supervision/ safety, Bed level Grooming: Minimal assistance, Sitting Upper Body Bathing: Moderate assistance, Sitting Lower Body Bathing: Sitting/lateral leans, Total assistance Upper Body Dressing : Moderate assistance, Sitting Lower Body Dressing: Total assistance, Sitting/lateral leans General ADL Comments: Pt able to sit at EOB for ADL participation this session. He was additionally able to scoot toward [facility] with min assist 3x.     Mobility  Overal bed mobility: Needs Assistance Bed Mobility: Supine to Sit, Sit to Supine Supine to sit: Max assist Sit to supine: Max assist General bed mobility comments: max assist  for management of bed mobility due to body habitus and air mattress    Transfers  Overall transfer level:  Needs assistance Equipment used: Rolling walker (2 wheeled) Transfers: Sit to/from Stand Sit to Stand: Mod assist, +2 physical assistance General transfer comment: Able to complete 3 lateral scoots at EOB to reposition in bed with min assist    Ambulation / Gait / Stairs / Wheelchair Mobility  Ambulation/Gait Ambulation/Gait assistance: Mod assist, +2 safety/equipment Gait Distance (Feet): 5 Feet(x2) Assistive device: Rolling walker (2 wheeled) Gait Pattern/deviations: Step-to pattern, Decreased stride length, Wide base of support General Gait Details: labored effort, rest break every 3 steps, pt with report "my legs are so weak, I'm going to fall" Gait velocity: slow Gait velocity interpretation: <1.8 ft/sec, indicate of risk for recurrent falls    Posture / Balance Dynamic Sitting Balance Sitting balance - Comments: supervision at EOB Balance Overall balance assessment: Needs assistance Sitting-balance support: Feet supported, Bilateral upper extremity supported Sitting balance-Leahy Scale: Fair Sitting balance - Comments: supervision at EOB Standing balance support: Bilateral upper extremity supported Standing balance-Leahy Scale: Poor Standing balance comment: dependent on RW    Special needs/care consideration BiPAP/CPAP yes but does not use CPM n/a Continuous Drip IV n/a Dialysis n/a Life Vest n/a Oxygen 28 % trach collar Special Bed Bari special bed Trach Size # 6 uncuffed XL Wound Vac n/a Skin PEG site; PIC line;[x] Meryle Ready, RN   [] Hover for details   WOC Nurse wound follow up Assessment completed in [facility] 2w22. Wound type:PI stage 4 to sacral/coccyx area Measurement:7 cm x 4 cm x 5 cm Wound bed:90% pink granulation tissue, remainder at the base of the wound is slightly discolored brownish. Drainage (amount, consistency, odor). Heavy amount on existing dressing. One moist 4 x 4 gauze was present in the wound bed. Peri wound:Intact, normal color  and texture Dressing procedure/placement/frequency:Saline moistened gauze twice daily, Santyl once daily. I have modified the dressing instructions to include using enough kerlex to cover the entire wound bed and lightly fill the wound. I have also added to order and place a Size wise bed with an air mattress, and to turn the patient every 2 hours.  The wound associated with the trach face plate is almost healed. What I saw was a very small area on the patient's right side at the corner of the face plate. A split gauze dressing was in place and padding the site.  Monitor the wound area(s) for worsening of condition such as: Signs/symptoms of infection,  Increase in size,  Development of or worsening of odor, Development of pain, or increased pain at the affected locations. Notify the medical team if any of these develop.  Val Riles, RN, MSN, CWOCN, CNS-BC, pager [phone]    Bowel mgmt: continent 6/26 Bladder mgmt: continent Diabetic mgmt yes   Previous Home Environment Living Arrangements: Alone  Lives With: Alone Available Help at Discharge: Available PRN/intermittently Type of Home: Apartment Home Layout: One level Home Access: Stairs to enter Entrance Stairs-Rails: Right, Left, Can reach both Entrance Stairs-Number of Steps: 14 total; 7 steps then landing and then 7 steps Bathroom Shower/Tub: Tub/shower unit, Curtain(sponge bath sitting on toilet) Bathroom Toilet: Standard Bathroom Accessibility: Yes How Accessible: Accessible via walker [place]: Yes Type of Home Care Services: Homehealth aide, [place] (if known): [facility] Additional Comments: niece checks on him daily  Discharge Living Setting Plans for Discharge Living Setting: Alone, Apartment(moving to first level aparmtent; same  complex) Type of Home at Discharge: Apartment Discharge Home Layout: One level Discharge Home Access: (moving within 2 weeks to first  floor apartment; same complex) Discharge Bathroom Shower/Tub: Tub/shower unit, Curtain Discharge Bathroom Toilet: Standard Discharge Bathroom Accessibility: Yes How Accessible: Accessible via walker Does the patient have any problems obtaining your medications?: No  Social/Family/Support Systems Patient Roles: Parent(56 year old son) Contact Information: niece and sister are main contact; not his son Anticipated Caregiver: niece and sister Anticipated Caregiver's Contact Information: see above Ability/Limitations of Caregiver: prn only Caregiver Availability: Intermittent Discharge Plan Discussed with Primary Caregiver: Yes Is Caregiver In Agreement with Plan?: Yes Does Caregiver/Family have Issues with Lodging/Transportation while Pt is in Rehab?: No  Patient needs new hospital bed for home that will be appropriate for his weight and size  Goals/Additional Needs Patient/Family Goal for Rehab: Mod I with PT, Mod I to supervision OT, mod I SLP Expected length of stay: [facility] 8 to 13 days; family requesting 2 weeks to get him moved to first level  Equipment Needs: Trach new this admit and is longterm trach fro d/c home Special Service Needs: Need larger hospital bed at home for his new weight(gained 100 lbs in past year) Pt/Family Agrees to Admission and willing to participate: Yes Program Orientation Provided & Reviewed with Pt/Caregiver Including Roles  & Responsibilities: Yes  Barriers to Discharge: Inaccessible home environment, Decreased caregiver support, Wound Care, Weight   Decrease burden of Care through IP rehab admission: n/a  Possible need for SNF placement upon discharge: no; niece assisting today with making arrangements to get patient moved to first level apartment within same complex. She is taking paperwork on 6/28 to his housing caseworker to sign off and apartment is ready for them to move in.   Patient Condition: This patient's condition remains as  documented in the consult dated 6/25, in which the Rehabilitation Physician determined and documented that the patient's condition is appropriate for intensive rehabilitative care in an inpatient rehabilitation facility. Will admit to inpatient rehab today.  Preadmission Screen Completed By:  Cleatrice Burke, 07/15/2017 12:45 PM ______________________________________________________________________   Discussed status with Dr. Naaman Plummer on 07/15/2017 at  1243 and received telephone approval for admission today.  Admission Coordinator:  Cleatrice Burke, time 5053 Date 07/15/2017         Cosigned by: Meredith Staggers, MD at 07/15/2017 1:49 PM  Revision History

## 2017-07-15 NOTE — Progress Notes (Signed)
Family Medicine Teaching Service Daily Progress Note Intern Pager: [phone]  Patient name: Timothy Le Medical record number: [id number] Date of birth: [birth year] Age: 57 y.o. Gender: male  Primary Care Provider: Kerin Perna, NP Consultants: PCCM, IR, Surgery, WOC Code Status: full  Pt Overview and Major Events to Date:  5/22 admitted to fpts-> subsequent decompensation, xfer to pccm 5/23 intubated 5/30 Tracheostomy 6/4 bedside sacral wound debridement in OR 6/17 PEG tube placement 6/21 Cuffless trach tube 6/25 FEES, advanced to dysphagia III, thin liquids 6/26 trimmed toenails 6/27 trimmed fingernails  Assessment and Plan:  Respiratory failure with hypercarbia  Obesity hypoventilation syndrome Trach tube changed on 6/21 to cuffless trach #6 without any complications. Continued steady improvement. Tolerating dysphagia III diet and thin liquids. Only small amount of charted PO intake, but did witness patient eating dinner in pm of 6/26. Continuing to wean tube feeds to help encourage PO. Patient likely with an additional day or 2 of PT needed to prepare for CIR placement. Patient with charted 60lb weight gain, suspect error with scale. - CCM following for trach care, can follow up in trach clinic after dc - dysphagia III diet and thin liquids - vital signs per stepdown routine - prn metolazone for increased swelling - albuterol prn for wheezing - suctioning prn for mucous plug - pepcid 20mg  bid - tube feeds at 20 mL/hr, wean off as tolerated  left groin abscess 3 x 2 x 0.6cm abscess in left groin. Unable to express purulent fluid and is spontaneously draining. Still been difficult to keep area dry. Will continue abx for total of 14 days. Stop July 1. - doxycycline 100mg  bid (6/18-7/1) - If starts to develop trouble with PO, can crush doxy and give through PEG - towel or wicking agent to keep area dry  Stage IV Sacral decubitus ulcer S/P debridement by surgery on  6/4. Surgery and Wound care signed off. 90% pink granulation tissue. Wound care evaluated 6/24 and recommend wet to dry bid, santyl daily. Patient with sizewize mattress which has helped. Increased ambulation will help with healing and pain. Wound care to continue following after dc to CIR. - Follow up wound care recs - sizewize mattress - santyl, bid wet to drys - fentanyl 25-100 mcg q2prn for pain from wound/with wound care - continue vitamin C to promote wound healing - ambulate as tolerated  Tracheostomy- s/p Trach change on 6/21by CCM - Trach care per routine.  - Per CCM recs will need serious weight loss before able to de-cannulate - in future will need night time sleep study with trach capped to further evaluate - CCM following for trach care & vent needs - follow up in trach clinic after discharge  Anemia- stable Likely due to a combination of blood loss from sacral wound, trach, and anemia of chronic disease. Last hgb 9.1 -Transfuse if hgb falls below 7.  Long fingernails - s/p toenail trimming 6/26 - Will trim fingernails 6/27  FEN/GI: Vital high protein liquid 82mL/hr PPx: pepcid, lovenox  Disposition: likely CIR, medically ready when CIR deems he is able to participate in intense rehab  Subjective:  Doing well this morning. Very hungry this am. Appreciative of toenail trimming.  Objective: Temp:  [97.8 F (36.6 C)-99.1 F (37.3 C)] 99.1 F (37.3 C) (06/27 0313) Pulse Rate:  [76-96] 96 (06/27 0313) Resp:  [18-24] 21 (06/27 0313) BP: (104-143)/(66-85) 122/74 (06/27 0407) SpO2:  [95 %-100 %] 98 % (06/27 0313) FiO2 (%):  [28 %] 28 % (  06/27 0407) Weight:  [380 lb (172.4 kg)] 380 lb (172.4 kg) (06/27 0600) Physical Exam: Gen: morbidly obese AA male resting comfortably in bed, NAD Heart: regular rate rhythm, no murmurs/rubs/gallops Lungs: Exam limited by body habitus. Lungs clear to auscultation bilaterally aside from some mildly transmitted upper respiratory  sounds Abdomen: obese, NT, ND, Soft. Non-distended. PEG tube in place LLQ. Extremities: no edema or cyanosis Neuro: tracks, follows commands, moves all extremities  Sacrum: stage 4 sacral decubitus ulcer present Left groin: Improving, still small amount of malodorous fluid draining  Laboratory: Recent Labs  Lab 07/10/17 0500  WBC 7.4  HGB 9.1*  HCT 32.4*  PLT 233   Recent Labs  Lab 07/10/17 0500  NA 138  K 3.9  CL 96*  CO2 32  BUN 22*  CREATININE 0.94  CALCIUM 9.0  GLUCOSE 117*    Imaging/Diagnostic Tests:  CT ABDOMEN WITHOUT CONTRAST IMPRESSION: No acute CT finding of the abdomen. Likely biliary sludge/microlithiasis without evidence of acute inflammation. Post pyloric tube terminates within the first portion the duodenum. 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 Atelectasis/scarring at the lung bases.  Guadalupe Dawn, MD 07/15/2017, 7:19 AM PGY-1, [place] Intern pager: [phone], text pages welcome

## 2017-07-16 ENCOUNTER — Inpatient Hospital Stay (HOSPITAL_COMMUNITY): Payer: Medicaid Other | Admitting: Speech Pathology

## 2017-07-16 ENCOUNTER — Inpatient Hospital Stay (HOSPITAL_COMMUNITY): Payer: Medicaid Other | Admitting: Occupational Therapy

## 2017-07-16 ENCOUNTER — Inpatient Hospital Stay (HOSPITAL_COMMUNITY): Payer: Medicaid Other | Admitting: Physical Therapy

## 2017-07-16 DIAGNOSIS — S31000A Unspecified open wound of lower back and pelvis without penetration into retroperitoneum, initial encounter: Secondary | ICD-10-CM

## 2017-07-16 DIAGNOSIS — E1169 Type 2 diabetes mellitus with other specified complication: Secondary | ICD-10-CM

## 2017-07-16 DIAGNOSIS — Z930 Tracheostomy status: Secondary | ICD-10-CM

## 2017-07-16 DIAGNOSIS — E669 Obesity, unspecified: Secondary | ICD-10-CM

## 2017-07-16 LAB — CBC WITH DIFFERENTIAL/PLATELET
ABS IMMATURE GRANULOCYTES: 0.1 10*3/uL (ref 0.0–0.1)
Basophils Absolute: 0 10*3/uL (ref 0.0–0.1)
Basophils Relative: 0 %
EOS PCT: 3 %
Eosinophils Absolute: 0.2 10*3/uL (ref 0.0–0.7)
HCT: 31.1 % — ABNORMAL LOW (ref 39.0–52.0)
Hemoglobin: 8.7 g/dL — ABNORMAL LOW (ref 13.0–17.0)
Immature Granulocytes: 1 %
LYMPHS PCT: 34 %
Lymphs Abs: 2.5 10*3/uL (ref 0.7–4.0)
MCH: 25.3 pg — AB (ref 26.0–34.0)
MCHC: 28 g/dL — ABNORMAL LOW (ref 30.0–36.0)
MCV: 90.4 fL (ref 78.0–100.0)
Monocytes Absolute: 0.6 10*3/uL (ref 0.1–1.0)
Monocytes Relative: 9 %
Neutro Abs: 3.7 10*3/uL (ref 1.7–7.7)
Neutrophils Relative %: 53 %
PLATELETS: 217 10*3/uL (ref 150–400)
RBC: 3.44 MIL/uL — ABNORMAL LOW (ref 4.22–5.81)
RDW: 17.4 % — ABNORMAL HIGH (ref 11.5–15.5)
WBC: 7.2 10*3/uL (ref 4.0–10.5)

## 2017-07-16 LAB — COMPREHENSIVE METABOLIC PANEL
ALT: 45 U/L — AB (ref 0–44)
ANION GAP: 7 (ref 5–15)
AST: 35 U/L (ref 15–41)
Albumin: 2.4 g/dL — ABNORMAL LOW (ref 3.5–5.0)
Alkaline Phosphatase: 51 U/L (ref 38–126)
BUN: 15 mg/dL (ref 6–20)
CALCIUM: 8.7 mg/dL — AB (ref 8.9–10.3)
CHLORIDE: 96 mmol/L — AB (ref 98–111)
CO2: 33 mmol/L — AB (ref 22–32)
CREATININE: 0.85 mg/dL (ref 0.61–1.24)
GFR calc non Af Amer: >60 mL/min (ref >60)
Glucose, Bld: 116 mg/dL — ABNORMAL HIGH (ref 70–99)
POTASSIUM: 4 mmol/L (ref 3.5–5.1)
SODIUM: 136 mmol/L (ref 135–145)
Total Bilirubin: 0.4 mg/dL (ref 0.3–1.2)
Total Protein: 7.3 g/dL (ref 6.5–8.1)

## 2017-07-16 LAB — GLUCOSE, CAPILLARY
GLUCOSE-CAPILLARY: 111 mg/dL — AB (ref 70–99)
GLUCOSE-CAPILLARY: 125 mg/dL — AB (ref 70–99)
GLUCOSE-CAPILLARY: 121 mg/dL — AB (ref 70–99)
GLUCOSE-CAPILLARY: 116 mg/dL — AB (ref 70–99)

## 2017-07-16 NOTE — H&P (Signed)
Physical Medicine and Rehabilitation Admission H&P       Chief Complaint  Patient presents with  . Shortness of Breath  : HPI: Timothy Le is a 57 year old right-handed male with history of asthma/COPD, OSA noncompliant with CPAP, tobacco abuse, morbid obesity with BMI 51.81, diabetes mellitus and hypertension.  Per chart review patient lives alone was using a walker for mobility.  He has friends that will do his grocery shopping.  He does have an aide to assist with some basic ADLs.  One level apartment with 14 steps to entry.  Family checks on him as needed.  Presented 06/09/2017 with increasing shortness of breath as well as progressive weight gain.  Patient did require intubation.  Chest x-ray showed lung volumes with bibasilar atelectasis.  Troponin  0.06, urine drug screen negative, creatinine 1.27.  Critical care pulmonary services consulted for respiratory failure continue to follow difficult weaning from ventilator ultimately underwent tracheostomy 06/17/2017 recently changed to a cuffless #6 XLT 07/09/2017.  Speech therapy working with PMV.  No plan for decannulation until weight loss.  Patient did require gastrostomy tube placement for nutritional support 07/05/2017.  Diet has been advanced to a mechanical soft thin liquid diet.  Subcutaneous Lovenox for DVT prophylaxis.  Wound care nurse follow-up for sacral wound with dressing care as directed.  Therapies have been initiated with progressive gains and recommendations of physical medicine rehab consult.  Patient was admitted for a comprehensive rehab program  Review of Systems  Constitutional: Negative for chills and fever.       Weight gain  HENT: Negative for hearing loss.   Eyes: Negative for blurred vision and double vision.  Respiratory: Positive for cough and shortness of breath.   Cardiovascular: Positive for leg swelling.  Gastrointestinal: Positive for constipation. Negative for nausea and vomiting.  Genitourinary:  Negative for flank pain and hematuria.  Musculoskeletal: Positive for joint pain and myalgias.  Skin: Negative for rash.  Neurological: Negative for seizures.       Past Medical History:  Diagnosis Date  . Asthma   . Chronic respiratory failure with hypoxia ([place]) 07/09/2017  . COPD (chronic obstructive pulmonary disease) ([place])   . Diabetes mellitus without complication ([place])   . Hypertension   . Shortness of breath dyspnea   . Sleep apnea         Past Surgical History:  Procedure Laterality Date  . IR GASTROSTOMY TUBE MOD SED  07/05/2017  . TRACHEOSTOMY TUBE PLACEMENT N/A 06/17/2017   Procedure: TRACHEOSTOMY;  Surgeon: Leta Baptist, MD;  Location: [facility] OR;  Service: ENT;  Laterality: N/A;        Family History  Problem Relation Age of Onset  . Asthma Sister   . Asthma Other        nephew  . Hypertension Sister   . Diabetes type II Sister    Social History:  reports that he quit smoking about 4 years ago. His smoking use included cigarettes. He started smoking about 33 years ago. He has a 0.25 pack-year smoking history. He has never used smokeless tobacco. He reports that he does not drink alcohol or use drugs. Allergies: No Known Allergies       Medications Prior to Admission  Medication Sig Dispense Refill  . amLODipine (NORVASC) 10 MG tablet Take 10 mg by mouth daily.    Marland Kitchen atorvastatin (LIPITOR) 10 MG tablet Take 10 mg by mouth daily.    . furosemide (LASIX) 40 MG tablet Take 1 tablet (  40 mg total) by mouth 2 (two) times daily. 60 tablet 2  . gemfibrozil (LOPID) 600 MG tablet Take 600 mg by mouth 2 (two) times daily before a meal. Thirty minutes before morning and evening meal.    . guaiFENesin (MUCINEX) 600 MG 12 hr tablet Take 1 tablet (600 mg total) by mouth 2 (two) times daily. 40 tablet 0  . insulin glargine (LANTUS) 100 UNIT/ML injection Inject 0.6 mLs (60 Units total) into the skin daily. 10 mL 11  . lisinopril-hydrochlorothiazide  (PRINZIDE,ZESTORETIC) 20-25 MG tablet Take 1 tablet by mouth daily.    Marland Kitchen loratadine (CLARITIN) 10 MG tablet 1 daily for allergy while needed (Patient taking differently: Take 10 mg by mouth daily as needed for allergies. ) 30 tablet 11  . metFORMIN (GLUCOPHAGE) 500 MG tablet Take 1,000 mg by mouth 2 (two) times daily with a meal.    . blood glucose meter kit and supplies Dispense based on patient and insurance preference. Use up to four times daily as directed. (FOR ICD-9 250.00, 250.01). 1 each 0    Drug Regimen Review Drug regimen was reviewed and remains appropriate with no significant issues identified  Home: Home Living Family/patient expects to be discharged to:: [place]: Non-relatives/Friends Additional Comments: Pt was living alone.   Functional History: Prior Function Level of Independence: Independent, Independent with assistive device(s) Comments: does have friends who go grocery shopping, aide to assist with bathing, was using a RW  Functional Status:  Mobility: Bed Mobility Overal bed mobility: Needs Assistance Bed Mobility: Supine to Sit, Sit to Supine Supine to sit: Max assist, +2 for physical assistance Sit to supine: Min assist, +2 for physical assistance General bed mobility comments: due to body habitus and air mattress pt required maxA for trunk elevation and to bring LEs off EOB, pt able to return self to L sidelying with use of bed rail and minA for LEs. Transfers Overall transfer level: Needs assistance Equipment used: Rolling walker (2 wheeled) Transfers: Sit to/from Stand Sit to Stand: Mod assist, +2 physical assistance General transfer comment: v/c's to push up from the bed and chair, modAX2 to steady pt during transition of hands as patient very unsteady. Pt then very dependent on RW Ambulation/Gait Ambulation/Gait assistance: Mod assist, +2 safety/equipment Gait Distance (Feet): 5 Feet(x2) Assistive device:  Rolling walker (2 wheeled) Gait Pattern/deviations: Step-to pattern, Decreased stride length, Wide base of support General Gait Details: labored effort, rest break every 3 steps, pt with report "my legs are so weak, I'm going to fall" Gait velocity: slow Gait velocity interpretation: <1.8 ft/sec, indicate of risk for recurrent falls  ADL:  Cognition: Cognition Overall Cognitive Status: Within Functional Limits for tasks assessed Orientation Level: Oriented to person, Oriented to place, Oriented to situation Cognition Arousal/Alertness: Awake/alert Behavior During Therapy: WFL for tasks assessed/performed Overall Cognitive Status: Within Functional Limits for tasks assessed  Physical Exam: Blood pressure 135/79, pulse 93, temperature 98.9 F (37.2 C), resp. rate (!) 23, height '5\' 6"'  (1.676 m), weight (!) 145.6 kg (321 lb), SpO2 99 %. Physical Exam  Constitutional: He is oriented to person, place, and time.  Morbidly obese  HENT:  Head: Normocephalic and atraumatic.  Eyes: Pupils are equal, round, and reactive to light. EOM are normal.  Neck:  Trach with PMV, reasonable phonation/hoarse  Cardiovascular: Normal rate and regular rhythm.  Respiratory: Effort normal.  Raspy airway sounds  GI: Soft. Bowel sounds are normal. He exhibits no distension.  Musculoskeletal:  1+ LE  edema  Neurological: He is alert and oriented to person, place, and time. No cranial nerve deficit. Coordination normal.  Fair insight and awareness. Motor 3+ to 4- prox to 4/5 distally in the Bilateral UE and LE's.   Skin:  Sacral decubitus was not examined as patient was not able to turn in bed  Psychiatric: He has a normal mood and affect. His behavior is normal.    LabResultsLast48Hours       Results for orders placed or performed during the hospital encounter of 06/09/17 (from the past 48 hour(s))  Glucose, capillary     Status: Abnormal   Collection Time: 07/12/17  5:07 PM  Result Value Ref  Range   Glucose-Capillary 126 (H) 65 - 99 mg/dL  Glucose, capillary     Status: Abnormal   Collection Time: 07/12/17 10:03 PM  Result Value Ref Range   Glucose-Capillary 133 (H) 65 - 99 mg/dL  Glucose, capillary     Status: Abnormal   Collection Time: 07/13/17  7:54 AM  Result Value Ref Range   Glucose-Capillary 122 (H) 70 - 99 mg/dL  Glucose, capillary     Status: Abnormal   Collection Time: 07/13/17 12:15 PM  Result Value Ref Range   Glucose-Capillary 140 (H) 70 - 99 mg/dL  Glucose, capillary     Status: Abnormal   Collection Time: 07/13/17  5:06 PM  Result Value Ref Range   Glucose-Capillary 111 (H) 70 - 99 mg/dL  Glucose, capillary     Status: Abnormal   Collection Time: 07/13/17  9:30 PM  Result Value Ref Range   Glucose-Capillary 139 (H) 70 - 99 mg/dL  Glucose, capillary     Status: Abnormal   Collection Time: 07/14/17  7:44 AM  Result Value Ref Range   Glucose-Capillary 131 (H) 70 - 99 mg/dL  Glucose, capillary     Status: Abnormal   Collection Time: 07/14/17 11:43 AM  Result Value Ref Range   Glucose-Capillary 125 (H) 70 - 99 mg/dL     ImagingResults(Last48hours)  No results found.       Medical Problem List and Plan: 1.  Debility secondary to VDRF/multi-medical issues as noted above             -admit to inpatient rehab 2.  DVT Prophylaxis/Anticoagulation: Subcutaneous Lovenox.  Monitor for any bleeding episodes 3. Pain Management: Tylenol as needed 4. Mood: Provide emotional support 5. Neuropsych: This patient is capable of making decisions on his own behalf. 6. Skin/Wound Care/sacrococcyx decubitus: WOC follow-up with wound care as directed -air mattress and local pressure relief and wound care 7. Fluids/Electrolytes/Nutrition: Routine in and outs follow-up chemistries 8.  Tracheostomy 06/17/2017 per Dr. Benjamine Mola downsized to cuffless XLT #6 07/09/2017.  Speech therapy follow-up PMV.NO PLAN FOR DECANNULATION UNTIL WEIGHT LOSS 9.   Gastrostomy tube 07/05/2017.  Diet advanced to mechanical soft thin liquids. 10.  Diabetes mellitus with peripheral neuropathy.  Hemoglobin A1c 5.4.  Lantus insulin 10 units nightly.  Check blood sugars as directed 11.  Morbid obesity.  BMI 51.81.  Follow-up dietary 12.  Acute on chronic anemia.  Follow-up CBC 13.  Hypertension.  Demadex 10 mg daily.  Post Admission Physician Evaluation: 1. Functional deficits secondary  to debility. 2. Patient is admitted to receive collaborative, interdisciplinary care between the physiatrist, rehab nursing staff, and therapy team. 3. Patient's level of medical complexity and substantial therapy needs in context of that medical necessity cannot be provided at a lesser intensity of care such as a SNF. 4.  Patient has experienced substantial functional loss from his/her baseline which was documented above under the "Functional History" and "Functional Status" headings.  Judging by the patient's diagnosis, physical exam, and functional history, the patient has potential for functional progress which will result in measurable gains while on inpatient rehab.  These gains will be of substantial and practical use upon discharge  in facilitating mobility and self-care at the household level. 5. Physiatrist will provide 24 hour management of medical needs as well as oversight of the therapy plan/treatment and provide guidance as appropriate regarding the interaction of the two. 6. The Preadmission Screening has been reviewed and patient status is unchanged unless otherwise stated above. 7. 24 hour rehab nursing will assist with bladder management, bowel management, safety, skin/wound care, disease management, medication administration, pain management and patient education  and help integrate therapy concepts, techniques,education, etc. 8. PT will assess and treat for/with: Lower extremity strength, range of motion, stamina, balance, functional mobility, safety, adaptive  techniques and equipment, family ed, activity tolerance.   Goals are: mod I., ?some w/c goals 9. OT will assess and treat for/with: ADL's, functional mobility, safety, upper extremity strength, adaptive techniques and equipment, NMR, family ed, ego support.   Goals are: mod I to supervision. Therapy may proceed with showering this patient. 10. SLP will assess and treat for/with: speech, communication, family ed.  Goals are: mod I. 11. Case Management and Social Worker will assess and treat for psychological issues and discharge planning. 12. Team conference will be held weekly to assess progress toward goals and to determine barriers to discharge. 13. Patient will receive at least 3 hours of therapy per day at least 5 days per week. 14. ELOS: 10-15 days       15. Prognosis:  excellent    I have personally performed a face to face diagnostic evaluation of this patient and formulated the key components of the plan.  Additionally, I have personally reviewed laboratory data, imaging studies, as well as relevant notes and concur with the physician assistant's documentation above. This exam and assessment was peformed on 07/15/17.    Meredith Staggers, MD, FAAPMR   Lavon Paganini Angiulli, PA-C

## 2017-07-16 NOTE — Progress Notes (Signed)
Social Work  Social Work Assessment and Plan  Patient Details  Name: Timothy Le MRN: [id number] Date of Birth: [birth year]  Today's Date: 07/16/2017  Problem List:  Patient Active Problem List   Diagnosis Date Noted  . Hyperlipidemia   . PEG (percutaneous endoscopic gastrostomy) status ([place])   . Labile blood pressure   . Acute blood loss anemia   . Anemia of chronic disease   . Diabetes mellitus type 2 in obese ([place])   . Sacral wound   . Tracheostomy status ([place])   . Debility 07/15/2017  . Chronic respiratory failure with hypoxia ([place]) 07/09/2017  . Abscess of groin, left   . Encounter for PEG (percutaneous endoscopic gastrostomy) ([place])   . Acute pulmonary edema ([facility])   . HCAP (healthcare-associated pneumonia)   . ARDS (adult respiratory distress syndrome) ([place])   . Pressure injury of skin 06/19/2017  . Difficult airway for intubation   . Respiratory distress 06/09/2017  . Respiratory failure with hypoxia and hypercapnia ([place]) 06/09/2017  . COPD with acute exacerbation ([place]) 06/17/2016  . Seasonal and perennial allergic rhinitis 05/24/2016  . AKI (acute kidney injury) ([place]) 04/08/2015  . Hypovolemic shock ([place]) 04/08/2015  . Sepsis ([place]) 04/08/2015  . Hyponatremia 04/08/2015  . HTN (hypertension) 03/28/2015  . Near syncope 03/28/2015  . Type 2 diabetes mellitus with hyperosmolar nonketotic hyperglycemia ([place]) 03/28/2015  . Hyperglycemia 03/27/2015  . Acute on chronic respiratory failure with hypoxemia ([place]) 03/27/2015  . Acute on chronic congestive heart failure ([place])   . COPD exacerbation ([place]) 02/07/2015  . Pressure ulcer 12/28/2014  . COPD mixed type ([place]) 12/27/2014  . Acute respiratory failure ([place]) 12/27/2014  . Morbid obesity ([place]) 12/27/2014  . Diabetes mellitus type 2, controlled ([place]) 12/27/2014    Class: Chronic  . Obstructive sleep apnea 07/29/2013  . Peripheral edema 07/29/2013   Past Medical History:  Past Medical History:  Diagnosis Date  .  Asthma   . Chronic respiratory failure with hypoxia ([place]) 07/09/2017  . COPD (chronic obstructive pulmonary disease) ([place])   . Diabetes mellitus without complication ([place])   . Difficult intubation   . Hypertension   . Shortness of breath dyspnea   . Sleep apnea    Past Surgical History:  Past Surgical History:  Procedure Laterality Date  . IR GASTROSTOMY TUBE MOD SED  07/05/2017  . TRACHEOSTOMY TUBE PLACEMENT N/A 06/17/2017   Procedure: TRACHEOSTOMY;  Surgeon: Leta Baptist, MD;  Location: [facility] OR;  Service: ENT;  Laterality: N/A;   Social History:  reports that he quit smoking about 4 years ago. His smoking use included cigarettes. He started smoking about 34 years ago. He has a 0.25 pack-year smoking history. He has never used smokeless tobacco. He reports that he does not drink alcohol or use drugs.  Family / Support Systems Marital Status: Single Patient Roles: Parent, Other (Comment)(son, brother) Children: son, Timothy Le @ (C) [phone] Other Supports: sister, [name] @ (C) [phone];  [name] @ (C) [phone] Anticipated Caregiver: niece and sister Ability/Limitations of Caregiver: prn only Caregiver Availability: Intermittent Family Dynamics: Pt describes family as very supportive, however, they are only able to provide intermittent assist.  Social History Preferred language: English Religion: Christian Cultural Background: NA Read: Yes Write: Yes Employment Status: Disabled Date Retired/Disabled/Unemployed: 2015 Legal Hisotry/Current Legal Issues: None Guardian/Conservator: None - per MD, pt is capable of making decisions on his own behalf.   Abuse/Neglect Abuse/Neglect Assessment Can Be Completed: Yes Physical Abuse: Denies Verbal Abuse: Denies  Sexual Abuse: Denies Exploitation of patient/patient's resources: Denies Self-Neglect: Denies  Emotional Status Pt's affect, behavior adn adjustment status: Pt very pleasant, speaks in loud voice  but apologizes for volume and completes assessment interview without any difficulty.  He admits frustration with his limitations and some concerns about d/c home with trach.  He denies any significant emotional distress, however, may benefit from neuropsych intervention to deal with situation. Recent Psychosocial Issues: None Pyschiatric History: None Substance Abuse History: None  Patient / Family Perceptions, Expectations & Goals Pt/Family understanding of illness & functional limitations: Pt and family with basic understanding of pt's decline due to COPD.  Aware likely home with trach and need for weight loss. Premorbid pt/family roles/activities: Pt was living alone and independent overall.  Very limited community level activity.  Anticipated changes in roles/activities/participation: Family to conitnue with intermittent support. Pt/family expectations/goals: "I just need to be a lot stronger than this."  [employer]: None Premorbid Home Care/DME Agencies: Other (Comment)([facility]) Transportation available at discharge: yes Resource referrals recommended: Neuropsychology  Discharge Planning Living Arrangements: Alone Support Systems: Children, Other relatives Type of Residence: Private residence Insurance Resources: Medicaid (specify county) Museum/gallery curator Resources: [place] Referred: No Living Expenses: Education officer, community Management: Patient Does the patient have any problems obtaining your medications?: No Home Management: pt Patient/Family Preliminary Plans: Pt to return to his apt complex, however, family working to secure a first level apt prior to his d/c. Social Work Anticipated Follow Up Needs: HH/OP Expected length of stay: ELOS 8 to 13 days; family requesting 2 weeks to get him moved to first level   Clinical Impression Pleasant gentleman here following respiratory failure and significant debility.  Anticipate will d/c home with trach and will need  teaching with pt and family.  Family only able to provide intermittent support and are currently trying to secure a first level apartment.  Pt motivated for CIR and denies any significant emotional distress.  He does admit some anxiety about his d/c plans and whether they will be able to secure new apt.  Will follow for support and d/c planning needs.  Jerrin Recore 07/16/2017, 1:27 PM

## 2017-07-16 NOTE — Progress Notes (Signed)
[facility] PHYSICAL MEDICINE & REHABILITATION     PROGRESS NOTE    Subjective/Complaints: Slept fairly well. Bottom hurts from wound. Breathing stable  ROS: Patient denies fever, rash, sore throat, blurred vision, nausea, vomiting, diarrhea, cough, shortness of breath or chest pain, joint or back pain, headache, or mood change.   Objective:  No results found. Recent Labs    07/15/17 2124 07/16/17 0356  WBC 7.0 7.2  HGB 8.5* 8.7*  HCT 30.4* 31.1*  PLT 213 217   Recent Labs    07/15/17 2124 07/16/17 0356  NA  --  136  K  --  4.0  CL  --  96*  GLUCOSE  --  116*  BUN  --  15  CREATININE 0.87 0.85  CALCIUM  --  8.7*   CBG (last 3)  Recent Labs    07/15/17 1714 07/15/17 2122 07/16/17 0648  GLUCAP 116* 124* 111*    Wt Readings from Last 3 Encounters:  07/16/17 (!) 173.3 kg (382 lb)  07/15/17 (!) 172.4 kg (380 lb)  01/20/17 (!) 166 kg (366 lb)     Intake/Output Summary (Last 24 hours) at 07/16/2017 0944 Last data filed at 07/16/2017 0700 Gross per 24 hour  Intake 360 ml  Output 700 ml  Net -340 ml    Vital Signs: Blood pressure 123/74, pulse 88, temperature 98.2 F (36.8 C), temperature source Oral, resp. rate 20, height 5\' 6"  (1.676 m), weight (!) 173.3 kg (382 lb), SpO2 97 %. Physical Exam:  Constitutional: No distress . Vital signs reviewed. obese HEENT: EOMI, oral membranes moist Neck: supple, #6 trach with PMV. hoarse Cardiovascular: RRR without murmur. No JVD    Respiratory: CTA Bilaterally without wheezes or rales. Normal effort    GI: BS +, non-tender, non-distended, PEG Musculoskeletal: 1+ LE edemabilaterally Neurological: He isalertand oriented to person, place, and time. Nocranial nerve deficits.Coordinationnormal.  Reasonable insight and awareness. Motor 3+ to 4- prox to 4/5 distally in the Bilateral UE and LE's. Skin: Sacral decub deep with pink,beefy granulation tissues, serous drainage on dressing, no-odor. Area tender to  manipulation Psychiatric: He has anormal mood and affect. Hisbehavior is normal.    Assessment/Plan: 1. Functional deficits secondary to debility after VDRF which require 3+ hours per day of interdisciplinary therapy in a comprehensive inpatient rehab setting. Physiatrist is providing close team supervision and 24 hour management of active medical problems listed below. Physiatrist and rehab team continue to assess barriers to discharge/monitor patient progress toward functional and medical goals.  Function:  Bathing Bathing position      Bathing parts      Bathing assist        Upper Body Dressing/Undressing Upper body dressing                    Upper body assist        Lower Body Dressing/Undressing Lower body dressing                                  Lower body assist        Toileting Toileting          Toileting assist     Transfers Chair/bed transfer     Chair/bed transfer assist level: Moderate assist (Pt 50 - 74%/lift or lower)       Locomotion Ambulation     Max distance: 15 Assist level: Touching or steadying assistance (Pt >  75%)   Wheelchair          Cognition Comprehension Comprehension assist level: Follows basic conversation/direction with no assist  Expression Expression assist level: Set-up assist with assistive device  Social Interaction Social Interaction assist level: Interacts appropriately with others with medication or extra time (anti-anxiety, antidepressant).  Problem Solving    Memory     Medical Problem List and Plan: 1.Debilitysecondary to VDRF/multi-medicalissues as noted above -admit to inpatient rehab 2. DVT Prophylaxis/Anticoagulation: Subcutaneous Lovenox. Monitor for any bleeding episodes 3. Pain Management:Tylenol as needed 4. Mood:Provide emotional support 5. Neuropsych: This patientiscapable of making decisions on hisown behalf. 6. Skin/Wound Care/sacrococcyx  decubitus:area clean  -continue packing of sacral wound, local care, air mattress  -appreciate WOC RN follow up 7. Fluids/Electrolytes/Nutrition:good po intake so far  -I personally reviewed the patient's labs today.   8.Tracheostomy 06/17/2017 per Dr. Cleaster Corin to cuffless XLT #6 07/09/2017.  Speech therapy follow-up   -continue PMV.  -NO PLAN FOR DECANNULATION UNTIL WEIGHT LOSS 9.Gastrostomy tube 07/05/2017. Diet advanced to mechanical soft thin liquids. 10.Diabetes mellitus with peripheral neuropathy. Hemoglobin A1c 5.4. Lantus insulin 10 units nightly.   -good control at present 11.Morbid obesity. BMI 51.81. Follow-up dietary 12.Acute on chronic anemia. Follow-up hgb 8.7 6/28 13.Hypertension. Demadex 10 mg daily.   LOS (Days) [place] EVALUATION WAS PERFORMED  Meredith Staggers, MD 07/16/2017 9:44 AM

## 2017-07-16 NOTE — Consult Note (Signed)
[place] Nurse wound consult note Patient was transferred from inpatient acute care to inpatient rehab care 07/15/17, thus the request for [facility] consult.  After reviewing the orders, my note from 07/12/17, and a telephone discussion with the patient's primary RN, Ed, I have learned that all orders for wound care are present, the patient is on a bariatric bed with air mattress, he has a bariatric chair and Ed will request a Roho chair cushion. A member of the [place] team will f/u next week for our weekly evaluation. Monitor the wound area(s) for worsening of condition such as: Signs/symptoms of infection,  Increase in size,  Development of or worsening of odor, Development of pain, or increased pain at the affected locations.  Notify the medical team if any of these develop.  Thank you for the consult.   Val Riles, RN, MSN, CWOCN, CNS-BC, pager [phone]

## 2017-07-16 NOTE — Evaluation (Signed)
Physical Therapy Assessment and Plan  Patient Details  Name: Timothy Le MRN: [id number] Date of Birth: 1960/05/27  PT Diagnosis: Difficulty walking and Muscle weakness Rehab Potential: Good ELOS: 9-12 days   Today's Date: 07/16/2017 PT Individual Time: 0815-0910 PT Individual Time Calculation (min): 55 min    Problem List:  Patient Active Problem List   Diagnosis Date Noted  . Sacral wound   . Tracheostomy status ([place])   . Debility 07/15/2017  . Chronic respiratory failure with hypoxia ([place]) 07/09/2017  . Abscess of groin, left   . Encounter for PEG (percutaneous endoscopic gastrostomy) ([place])   . Acute pulmonary edema (HCC)   . HCAP (healthcare-associated pneumonia)   . ARDS (adult respiratory distress syndrome) ([place])   . Pressure injury of skin 06/19/2017  . Difficult airway for intubation   . Respiratory distress 06/09/2017  . Respiratory failure with hypoxia and hypercapnia ([place]) 06/09/2017  . COPD with acute exacerbation ([place]) 06/17/2016  . Seasonal and perennial allergic rhinitis 05/24/2016  . AKI (acute kidney injury) ([place]) 04/08/2015  . Hypovolemic shock ([place]) 04/08/2015  . Sepsis ([place]) 04/08/2015  . Hyponatremia 04/08/2015  . HTN (hypertension) 03/28/2015  . Near syncope 03/28/2015  . Type 2 diabetes mellitus with hyperosmolar nonketotic hyperglycemia ([place]) 03/28/2015  . Hyperglycemia 03/27/2015  . Acute on chronic respiratory failure with hypoxemia ([place]) 03/27/2015  . Acute on chronic congestive heart failure ([place])   . COPD exacerbation ([place]) 02/07/2015  . Pressure ulcer 12/28/2014  . COPD mixed type ([place]) 12/27/2014  . Acute respiratory failure ([place]) 12/27/2014  . Morbid obesity ([place]) 12/27/2014  . Diabetes mellitus type 2, controlled ([place]) 12/27/2014    Class: Chronic  . Obstructive sleep apnea 07/29/2013  . Peripheral edema 07/29/2013    Past Medical History:  Past Medical History:  Diagnosis Date  . Asthma   . Chronic respiratory failure with  hypoxia ([place]) 07/09/2017  . COPD (chronic obstructive pulmonary disease) ([place])   . Diabetes mellitus without complication ([place])   . Difficult intubation   . Hypertension   . Shortness of breath dyspnea   . Sleep apnea    Past Surgical History:  Past Surgical History:  Procedure Laterality Date  . IR GASTROSTOMY TUBE MOD SED  07/05/2017  . TRACHEOSTOMY TUBE PLACEMENT N/A 06/17/2017   Procedure: TRACHEOSTOMY;  Surgeon: Leta Baptist, MD;  Location: [facility] OR;  Service: ENT;  Laterality: N/A;    Assessment & Plan Clinical Impression: Patient is a 57 y.o. year old male with recent admission to the hospital on 06/09/2017 with increasing shortness of breath as well as progressive weight gain. Patient did require intubation. Chest x-ray showed lung volumes with bibasilar atelectasis. Troponin 0.06, urine drug screen negative, creatinine 1.27. Critical care pulmonary services consulted for respiratory failure continue to follow difficult weaning from ventilator ultimately underwent tracheostomy 06/17/2017 recently changed to a cuffless #6 XLT 07/09/2017. Speech therapy working with PMV. No plan for decannulation until weight loss. Patient did require gastrostomy tube placement for nutritional support 07/05/2017.  Patient transferred to CIR on 07/15/2017 .   Patient currently requires mod with mobility secondary to muscle weakness, decreased cardiorespiratoy endurance and decreased oxygen support and decreased standing balance and decreased balance strategies.  Prior to hospitalization, patient was modified independent  with mobility and lived with Alone in a [place] home.  Home access is pt reports he plans to move to apartment with level entryLevel entry.  Patient will benefit from skilled PT intervention to maximize safe functional mobility, minimize fall  risk and decrease caregiver burden for planned discharge home with intermittent assist.  Anticipate patient will benefit from follow up [place] at  discharge.  PT - End of Session Activity Tolerance: Tolerates 30+ min activity with multiple rests Endurance Deficit: Yes PT Assessment Rehab Potential (ACUTE/IP ONLY): Good PT Barriers to Discharge: [place] home environment;Trach;Lack of/limited family support;Wound Care PT Patient demonstrates impairments in the following area(s): Balance;Endurance;Motor;Pain;Skin Integrity;Sensory PT Transfers Functional Problem(s): Bed Mobility;Bed to Chair;Car;Furniture PT Locomotion Functional Problem(s): Ambulation;Wheelchair Mobility;Stairs PT Plan PT Intensity: Minimum of 1-2 x/day ,45 to 90 minutes PT Frequency: 5 out of 7 days PT Duration Estimated Length of Stay: 9-12 days PT Treatment/Interventions: Ambulation/gait training;Discharge planning;Functional mobility training;Therapeutic Activities;Balance/vestibular training;Neuromuscular re-education;Therapeutic Exercise;Wheelchair propulsion/positioning;UE/LE Strength taining/ROM;Splinting/orthotics;Pain management;DME/adaptive equipment instruction;Community reintegration;Patient/family education;Stair training;UE/LE Coordination activities PT Transfers Anticipated Outcome(s): mod I PT Locomotion Anticipated Outcome(s): mod I PT Recommendation Follow Up Recommendations: Home health PT Patient destination: Home Equipment Recommended: To be determined  Skilled Therapeutic Intervention Pt participated in skilled PT eval and was educated on PT goals and POC.  Pt performed supine to sit with assist for trunk and use of bedrails. Pt reports need to use BSC, pt performs sit to stand with mod A with RW, min A for stand pivot transfer to [facility].  Pt requires total A for hygiene, mod A to stand from commode. Pt seated in recliner with roho cushion due to sacral wound.  Pt performs gait 10', 15' with min A with RW, spO2 >95% with activity on 3L, 28%.  Pt performs standing marching for endurance x 30 seconds before fatigue.  Pt left in recliner with needs  at hand.  PT Evaluation Precautions/Restrictions Precautions Precautions: Fall Precaution Comments: trach Restrictions Weight Bearing Restrictions: No General   Patient Position (if appropriate): standing Oxygen Therapy SpO2: 97 % O2 Device: Tracheostomy Collar O2 Flow Rate (L/min): 3 L/min FiO2 (%): 28 % Pain Pain Assessment Faces Pain Scale: Hurts a little bit Pain Location: Sacrum Pain Orientation: Medial Pain Descriptors / Indicators: Aching Pain Onset: On-going Pain Intervention(s): RN made aware;Repositioned;Ambulation/increased activity Home Living/Prior Functioning Home Living Available Help at Discharge: Available PRN/intermittently Type of Home: Apartment Home Access: Level entry Entrance Stairs-Number of Steps: pt reports he plans to move to apartment with level entry Home Layout: One level  Lives With: Alone Prior Function Level of Independence: Requires assistive device for independence;Needs assistance with homemaking  Able to Take Stairs?: Yes Vocation: Retired Comments: pt reports he has assist wtih IADLs and grocery shopping, able to perform ADLs independently, uses RW  Cognition Overall Cognitive Status: Within Functional Limits for tasks assessed Arousal/Alertness: Awake/alert Orientation Level: Oriented X4 Sensation Sensation Light Touch: Appears Intact Proprioception: Appears Intact Coordination Gross Motor Movements are Fluid and Coordinated: Yes Motor  Motor Motor - Skilled Clinical Observations: generalized weakness  Mobility Bed Mobility Bed Mobility: Supine to Sit Supine to Sit: Moderate Assistance - Patient 50-74% Transfers Transfers: Sit to Stand;Stand Pivot Transfers Sit to Stand: Moderate Assistance - Patient 50-74% Stand Pivot Transfers: Moderate Assistance - Patient 50 - 74% Stand Pivot Transfer Details (indicate cue type and reason): pt requires assist for steadying and initial lifting assist Transfer (Assistive device):  Rolling walker Locomotion  Gait Ambulation: Yes Gait Assistance: Minimal Assistance - Patient > 75% Gait Distance (Feet): 15 Feet Assistive device: Rolling walker Gait Assistance Details: wide BOS due to body habitus, pt distance limited by SOB Stairs / Additional Locomotion Stairs: No  Trunk/Postural Assessment  Cervical Assessment Cervical Assessment: Within Functional Limits Thoracic Assessment  Thoracic Assessment: (rounded shoulders) Lumbar Assessment Lumbar Assessment: (posterior pelvic tilt) Postural Control Postural Control: Deficits on evaluation Righting Reactions: delayed  Balance Dynamic Sitting Balance Sitting balance - Comments: supervision at EOB Dynamic Standing Balance Dynamic Standing - Comments: min A with RW Extremity Assessment      RLE Assessment General Strength Comments: grossly 3/5 LLE Assessment General Strength Comments: grossly 3/5   See Function Navigator for Current Functional Status.   Refer to Care Plan for Long Term Goals  Recommendations for other services: None   Discharge Criteria: Patient will be discharged from PT if patient refuses treatment 3 consecutive times without medical reason, if treatment goals not met, if there is a change in medical status, if patient makes no progress towards goals or if patient is discharged from hospital.  The above assessment, treatment plan, treatment alternatives and goals were discussed and mutually agreed upon: by patient  DONAWERTH,KAREN 07/16/2017, 10:11 AM

## 2017-07-16 NOTE — Evaluation (Addendum)
Occupational Therapy Assessment and Plan  Patient Details  Name: Timothy Le MRN: [id number] Date of Birth: Apr 29, 1960  OT Diagnosis: abnormal posture and muscle weakness (generalized) Rehab Potential: Rehab Potential (ACUTE ONLY): Good ELOS: 14-16 days   Today's Date: 07/16/2017 OT Individual Time: 1417-1530 OT Individual Time Calculation (min): 73 min     Problem List:  Patient Active Problem List   Diagnosis Date Noted  . Sacral wound   . Tracheostomy status ([place])   . Debility 07/15/2017  . Chronic respiratory failure with hypoxia ([place]) 07/09/2017  . Abscess of groin, left   . Encounter for PEG (percutaneous endoscopic gastrostomy) ([place])   . Acute pulmonary edema (HCC)   . HCAP (healthcare-associated pneumonia)   . ARDS (adult respiratory distress syndrome) ([place])   . Pressure injury of skin 06/19/2017  . Difficult airway for intubation   . Respiratory distress 06/09/2017  . Respiratory failure with hypoxia and hypercapnia ([place]) 06/09/2017  . COPD with acute exacerbation ([place]) 06/17/2016  . Seasonal and perennial allergic rhinitis 05/24/2016  . AKI (acute kidney injury) ([place]) 04/08/2015  . Hypovolemic shock ([place]) 04/08/2015  . Sepsis ([place]) 04/08/2015  . Hyponatremia 04/08/2015  . HTN (hypertension) 03/28/2015  . Near syncope 03/28/2015  . Type 2 diabetes mellitus with hyperosmolar nonketotic hyperglycemia ([place]) 03/28/2015  . Hyperglycemia 03/27/2015  . Acute on chronic respiratory failure with hypoxemia ([place]) 03/27/2015  . Acute on chronic congestive heart failure ([place])   . COPD exacerbation ([place]) 02/07/2015  . Pressure ulcer 12/28/2014  . COPD mixed type ([place]) 12/27/2014  . Acute respiratory failure ([place]) 12/27/2014  . Morbid obesity ([place]) 12/27/2014  . Diabetes mellitus type 2, controlled ([place]) 12/27/2014    Class: Chronic  . Obstructive sleep apnea 07/29/2013  . Peripheral edema 07/29/2013    Past Medical History:  Past Medical History:  Diagnosis Date   . Asthma   . Chronic respiratory failure with hypoxia ([place]) 07/09/2017  . COPD (chronic obstructive pulmonary disease) ([place])   . Diabetes mellitus without complication ([place])   . Difficult intubation   . Hypertension   . Shortness of breath dyspnea   . Sleep apnea    Past Surgical History:  Past Surgical History:  Procedure Laterality Date  . IR GASTROSTOMY TUBE MOD SED  07/05/2017  . TRACHEOSTOMY TUBE PLACEMENT N/A 06/17/2017   Procedure: TRACHEOSTOMY;  Surgeon: Leta Baptist, MD;  Location: [facility] OR;  Service: ENT;  Laterality: N/A;    Assessment & Plan Clinical Impression: [name] is a 57 year old right-handed male with history of asthma/COPD, OSA noncompliant with CPAP, tobacco abuse, morbid obesity with BMI 51.81, diabetes mellitus and hypertension. Per chart review patient lives alone was using a walker for mobility. He has friends that will do his grocery shopping. He does have an aide to assist with some basic ADLs. One level apartment with 14 steps to entry. Family checks on him as needed. Presented 06/09/2017 with increasing shortness of breath as well as progressive weight gain. Patient did require intubation. Chest x-ray showed lung volumes with bibasilar atelectasis. Troponin 0.06, urine drug screen negative, creatinine 1.27. Critical care pulmonary services consulted for respiratory failure continue to follow difficult weaning from ventilator ultimately underwent tracheostomy 06/17/2017 recently changed to a cuffless #6 XLT 07/09/2017. Speech therapy working with PMV. No plan for decannulation until weight loss. Patient did require gastrostomy tube placement for nutritional support 07/05/2017. Diet has been advanced to a mechanical soft thin liquid diet. Subcutaneous Lovenox for DVT prophylaxis. Wound care nurse follow-up  for sacral wound with dressing care as directed. Therapies have been initiated with progressive gains and recommendations of physical medicine rehab  consult. Patient was admitted for a comprehensive rehab program  Patient currently requires total with basic self-care skills secondary to muscle weakness, decreased cardiorespiratoy endurance and decreased standing balance and decreased balance strategies.  Prior to hospitalization, patient could complete BADLs with modified independent -Max A with home health aides assisting x3 weekly.  Patient will benefit from skilled intervention to increase independence with basic self-care skills prior to discharge home independently.  Anticipate patient will require intermittent supervision and follow up home health.  OT - End of Session Activity Tolerance: Tolerates 30+ min activity with multiple rests Endurance Deficit: Yes OT Assessment Rehab Potential (ACUTE ONLY): Good OT Barriers to Discharge: Trach;Medical stability;Wound Care;Lack of/limited family support;Weight;New oxygen OT Patient demonstrates impairments in the following area(s): Balance;Safety;Edema;Endurance;Skin Integrity;Motor OT Basic ADL's Functional Problem(s): Grooming;Bathing;Dressing;Toileting OT Advanced ADL's Functional Problem(s): Simple Meal Preparation OT Transfers Functional Problem(s): Toilet;Tub/Shower OT Additional Impairment(s): None OT Plan OT Intensity: Minimum of 1-2 x/day, 45 to 90 minutes OT Frequency: 5 out of 7 days OT Duration/Estimated Length of Stay: 14-16 days OT Treatment/Interventions: Balance/vestibular training;Discharge planning;Functional electrical stimulation;Pain management;Self Care/advanced ADL retraining;Therapeutic Activities;UE/LE Coordination activities;Visual/perceptual remediation/compensation;Therapeutic Exercise;Skin care/wound managment;Patient/family education;Functional mobility training;Disease mangement/prevention;Cognitive remediation/compensation;Community reintegration;DME/adaptive equipment instruction;Neuromuscular re-education;Psychosocial support;Splinting/orthotics;UE/LE  Strength taining/ROM;Wheelchair propulsion/positioning OT Self Feeding Anticipated Outcome(s): No goal OT Basic Self-Care Anticipated Outcome(s): Min A OT Toileting Anticipated Outcome(s): Mod I  OT Bathroom Transfers Anticipated Outcome(s): Supervision/setup-Mod I  OT Recommendation Recommendations for Other Services: Neuropsych consult Patient destination: Home Follow Up Recommendations: Home health OT Equipment Recommended: To be determined  Skilled Therapeutic Intervention Pt greeted supine in bed. RN aware of sacral wound pain. Skilled OT session completed with focus on initial evaluation, education on OT role/POC, and establishment of patient-centered goals. Supine<sit completed with Mod A for bathing EOB. Pt able to assist with UB and upper legs, however required Total A for thoroughness due to size. Throughout session, he was a bit teary, expressing deep regret regarding his previous lifestyle choices, and reported feeling motivated to change for the better. OT provided pt with emotional support and encouragement to enhance feelings of self efficacy. He completed functional transfer with RW at ambulatory level and steady assist to chair placed at sink. Pt completed grooming tasks/oral care with setup. Afterwards we discussed his personal goals for home, as he will be having home health aides at d/c. He was then repositioned for comfort in reclining pressure-relief chair. Left pt with all needs within reach at session exit.   OT Evaluation Precautions/Restrictions  Precautions Precautions: Fall Precaution Comments: trach Restrictions Weight Bearing Restrictions: No General Chart Reviewed: Yes Family/Caregiver Present: No Vital Signs Therapy Vitals Pulse Rate: 93 Resp: 18 BP: (!) 153/81 Patient Position (if appropriate): Sitting Oxygen Therapy SpO2: 99 % O2 Device: Tracheostomy Collar O2 Flow Rate (L/min): 5 L/min FiO2 (%): 28 % Pain Pain Assessment Pain Scale: 0-10 Pain  Score: 0-No pain Pain Type: Acute pain Pain Location: Sacrum Pain Orientation: Medial Pain Descriptors / Indicators: Aching Pain Frequency: Constant Pain Onset: On-going Patients Stated Pain Goal: 4 Pain Intervention(s): Medication (See eMAR) Home Living/Prior Functioning Home Living Available Help at Discharge: Available PRN/intermittently Type of Home: Apartment Home Access: Level entry Entrance Stairs-Number of Steps: pt reports he plans to move to apartment with level entry Home Layout: One level Bathroom Shower/Tub: Tub/shower unit, Curtain(He sponge baths sitting on toilet) Bathroom Toilet: Standard Bathroom Accessibility: Yes  Lives With: Alone IADL History Homemaking Responsibilities: No(Family assisted with IADL needs and he had take out meals delivered) Current License: No Occupation: Retired Type of Occupation: Worked at [employer] for +20 years Leisure and Hobbies: Watching football Prior Function Level of Independence: [employer] for independence, Needs assistance with homemaking, Needs assistance with ADLs  Able to Take Stairs?: Yes Vocation: Retired Comments: Pt reported home health aides provided Max A with bathing and dressing. He was able to complete perihygiene himself. He is motivated to do more at home functionally post CIR stay ADL ADL ADL Comments: Please see functional navigator for ADL status Vision Baseline Vision/History: Wears glasses(He reports his glasses are at home) Patient Visual Report: No change from baseline Vision Assessment?: No apparent visual deficits Perception  Perception: Within Functional Limits Praxis Praxis: Intact Cognition Overall Cognitive Status: Within Functional Limits for tasks assessed Arousal/Alertness: Awake/alert Orientation Level: Person;Place;Situation Person: Oriented Place: Oriented Situation: Oriented Year: 2019(At first "46" then "Oh wait- 2019") Month: June Day of Week: Correct Memory:  Appears intact Immediate Memory Recall: Sock;Blue;Bed Memory Recall: Sock;Blue;Bed Memory Recall Sock: Without Cue Memory Recall Blue: Without Cue Memory Recall Bed: Without Cue Attention: Sustained Sustained Attention: Appears intact Awareness: Appears intact Problem Solving: Appears intact Safety/Judgment: Appears intact Sensation Sensation Light Touch: Appears Intact Proprioception: Appears Intact Coordination Gross Motor Movements are Fluid and Coordinated: Yes Fine Motor Movements are Fluid and Coordinated: Yes Motor  Motor Motor: Other (comment) Motor - Skilled Clinical Observations: generalized weakness and decreased cardiorespiratory endurance  Trunk/Postural Assessment  Cervical Assessment Cervical Assessment: Within Functional Limits Thoracic Assessment Thoracic Assessment: Exceptions to WFL(rounded shoulders) Lumbar Assessment Lumbar Assessment: Exceptions to WFL(posterior pelvic tilt) Postural Control Postural Control: Deficits on evaluation  Balance Balance Balance Assessed: Yes Dynamic Sitting Balance Dynamic Sitting - Level of Assistance: 4: Min assist Sitting balance - Comments: Washing UB EOB Dynamic Standing Balance Dynamic Standing - Level of Assistance: 4: Min assist Dynamic Standing - Comments: Functional transfers with RW Extremity/Trunk Assessment RUE Assessment RUE Assessment: Within Functional Limits LUE Assessment LUE Assessment: Within Functional Limits   See Function Navigator for Current Functional Status.   Refer to Care Plan for Long Term Goals  Recommendations for other services: Neuropsych   Discharge Criteria: Patient will be discharged from OT if patient refuses treatment 3 consecutive times without medical reason, if treatment goals not met, if there is a change in medical status, if patient makes no progress towards goals or if patient is discharged from hospital.  The above assessment, treatment plan, treatment alternatives  and goals were discussed and mutually agreed upon: by patient  Skeet Simmer 07/16/2017, 4:11 PM

## 2017-07-16 NOTE — Evaluation (Signed)
Speech Language Pathology Assessment and Plan  Patient Details  Name: Timothy SAMES MRN: [id number] Date of Birth: [birth year]  SLP Diagnosis: Voice disorder;Dysphagia  Rehab Potential: Good ELOS: 10-12 days     Today's Date: 07/16/2017 SLP Individual Time: 1230-1300 SLP Individual Time Calculation (min): 30 min   Problem List:  Patient Active Problem List   Diagnosis Date Noted  . Sacral wound   . Tracheostomy status ([place])   . Debility 07/15/2017  . Chronic respiratory failure with hypoxia ([place]) 07/09/2017  . Abscess of groin, left   . Encounter for PEG (percutaneous endoscopic gastrostomy) ([place])   . Acute pulmonary edema (HCC)   . HCAP (healthcare-associated pneumonia)   . ARDS (adult respiratory distress syndrome) ([place])   . Pressure injury of skin 06/19/2017  . Difficult airway for intubation   . Respiratory distress 06/09/2017  . Respiratory failure with hypoxia and hypercapnia ([place]) 06/09/2017  . COPD with acute exacerbation ([place]) 06/17/2016  . Seasonal and perennial allergic rhinitis 05/24/2016  . AKI (acute kidney injury) ([place]) 04/08/2015  . Hypovolemic shock ([place]) 04/08/2015  . Sepsis ([place]) 04/08/2015  . Hyponatremia 04/08/2015  . HTN (hypertension) 03/28/2015  . Near syncope 03/28/2015  . Type 2 diabetes mellitus with hyperosmolar nonketotic hyperglycemia ([place]) 03/28/2015  . Hyperglycemia 03/27/2015  . Acute on chronic respiratory failure with hypoxemia ([place]) 03/27/2015  . Acute on chronic congestive heart failure ([place])   . COPD exacerbation ([place]) 02/07/2015  . Pressure ulcer 12/28/2014  . COPD mixed type ([place]) 12/27/2014  . Acute respiratory failure ([place]) 12/27/2014  . Morbid obesity ([place]) 12/27/2014  . Diabetes mellitus type 2, controlled ([place]) 12/27/2014    Class: Chronic  . Obstructive sleep apnea 07/29/2013  . Peripheral edema 07/29/2013   Past Medical History:  Past Medical History:  Diagnosis Date  . Asthma   . Chronic respiratory failure with  hypoxia ([place]) 07/09/2017  . COPD (chronic obstructive pulmonary disease) ([name])   . Diabetes mellitus without complication ([place])   . Difficult intubation   . Hypertension   . Shortness of breath dyspnea   . Sleep apnea    Past Surgical History:  Past Surgical History:  Procedure Laterality Date  . IR GASTROSTOMY TUBE MOD SED  07/05/2017  . TRACHEOSTOMY TUBE PLACEMENT N/A 06/17/2017   Procedure: TRACHEOSTOMY;  Surgeon: Leta Baptist, MD;  Location: [facility];  Service: ENT;  Laterality: N/A;    Assessment / Plan / Recommendation Clinical Impression   Timothy Le is a 57 year old right-handed male with history of asthma/COPD, OSA noncompliant with CPAP, tobacco abuse, morbid obesity with BMI 51.81, diabetes mellitus and hypertension. Per chart review patient lives alone was using a walker for mobility. He has friends that will do his grocery shopping. He does have an aide to assist with some basic ADLs. One level apartment with 14 steps to entry. Family checks on him as needed. Presented 06/09/2017 with increasing shortness of breath as well as progressive weight gain. Patient did require intubation. Chest x-ray showed lung volumes with bibasilar atelectasis. Troponin 0.06, urine drug screen negative, creatinine 1.27. Critical care pulmonary services consulted for respiratory failure continue to follow difficult weaning from ventilator ultimately underwent tracheostomy 06/17/2017 recently changed to a cuffless #6 XLT 07/09/2017. Speech therapy working with PMV. No plan for decannulation until weight loss. Patient did require gastrostomy tube placement for nutritional support 07/05/2017. Diet has been advanced to a mechanical soft thin liquid diet. Subcutaneous Lovenox for DVT prophylaxis. Wound care nurse follow-up for sacral wound  with dressing care as directed. Therapies have been initiated with progressive gains and recommendations of physical medicine rehab consult. Patient was admitted  for a comprehensive rehab program.  SLP evaluation was completed on 07/16/2017 with the following results:  Pt presents with coughing both with and without PO intake.  Coughing did not appear to increase in intensity or frequency as pt ate and drank during his lunch meal.  Pt's risk factors for aspiration remain the same, including body habitus and decreased respiratory status.  Pt reports that he was oxygen dependent at home (3L O2 during the day) and is currently able to maintain O2 sats on 28% FiO2.  Recommend that pt remain on his currently prescribed diet with trials of advanced textures with SLP to continue working towards safe diet progression.   Pt also demonstrates good toleration of PMSV.  He was wearing valve upon therapist's arrival with no difficulty maintaining normal vital signs.  Pt appears anxious about his trach overall but no specific anxiety verbalized about the speaking valve.  Pt did have difficulty sustaining phonation during longer sentences due to decreased breath support for speech; however he was fully intelligible.  Question pt's baseline breath support for speech due to chronic respiratory issues and body habitus.   Pt would benefit from skilled ST while inpatient for management of safe diet progression and education regarding PMSV given that pt is not likely to be decannulated prior to discharge.      Skilled Therapeutic Interventions          Passy Muir speaking valve and bedside swallow evaluation completed with results and recommendations reviewed with patient    SLP Assessment  Patient will need skilled [facility]ology Services during CIR admission    Recommendations  Patient may use Passy-Muir Speech Valve: During all waking hours (remove during sleep);During all therapies with supervision;During PO intake/meals PMSV Supervision: Intermittent MD: Please consider changing trach tube to : Smaller size SLP Diet Recommendations: Dysphagia 3 (Mech  soft);Thin Liquid Administration via: Cup;No straw Medication Administration: Whole meds with puree(or via PEG) Supervision: Patient able to self feed;Intermittent supervision to cue for compensatory strategies Compensations: Slow rate;Small sips/bites;Clear throat intermittently;Multiple dry swallows after each bite/sip Postural Changes and/or Swallow Maneuvers: Seated upright 90 degrees;Upright 30-60 min after meal Oral Care Recommendations: Oral care BID Recommendations for Other Services: Neuropsych consult Patient destination: Home Follow up Recommendations: Other (comment)(To be determined pending progress made while inpatient ) Equipment Recommended: To be determined    SLP Frequency 3 to 5 out of 7 days   SLP Duration  SLP Intensity  SLP Treatment/Interventions 10-12 days   Minumum of 1-2 x/day, 30 to 90 minutes  Cueing hierarchy;Internal/external aids;Environmental controls;Patient/family education;Dysphagia/aspiration precaution training    Pain Pain Assessment Pain Scale: 0-10 Pain Score: 0-No pain Pain Type: Acute pain Pain Location: Sacrum Pain Orientation: Medial Pain Descriptors / Indicators: Aching Pain Frequency: Constant Pain Onset: On-going Patients Stated Pain Goal: 4 Pain Intervention(s): Medication (See eMAR)  Prior Functioning Cognitive/Linguistic Baseline: Within functional limits Type of Home: Apartment  Lives With: Alone Available Help at Discharge: Available PRN/intermittently Vocation: Retired  Function:  Eating Eating   Modified Consistency Diet: Yes Eating Assist Level: More than reasonable amount of time           Cognition Comprehension Comprehension assist level: Follows basic conversation/direction with no assist  Expression Expression assistive device: Talk trach valve Expression assist level: Expresses basic 90% of the time/requires cueing < 10% of the time.  Social  Interaction Social Interaction assist level: Interacts  appropriately with others with medication or extra time (anti-anxiety, antidepressant).  Problem Solving Problem solving assist level: Solves basic problems with no assist  Memory Memory assist level: More than reasonable amount of time   Short Term Goals: Week 1: SLP Short Term Goal 1 (Week 1): Pt will donn and doff speaking valve with supervision verbal cues.  SLP Short Term Goal 2 (Week 1): Pt will consume dys 3 textures and thin liquids with mod I use of swallowing precautions and minimal overt s/s of aspiration.  SLP Short Term Goal 3 (Week 1): Pt will consume therapeutic trials of regular textures with mod I use of swallowing precautions and no overt s/s of aspiration.  SLP Short Term Goal 4 (Week 1): Pt will coordinate onset of phonation with respirations to sustain voicing at the sentence level.   Refer to Care Plan for Long Term Goals  Recommendations for other services: Neuropsych  Discharge Criteria: Patient will be discharged from SLP if patient refuses treatment 3 consecutive times without medical reason, if treatment goals not met, if there is a change in medical status, if patient makes no progress towards goals or if patient is discharged from hospital.  The above assessment, treatment plan, treatment alternatives and goals were discussed and mutually agreed upon: by patient  Emilio Math 07/16/2017, 3:26 PM

## 2017-07-16 NOTE — Consult Note (Signed)
Pt will be available between 11a -1p for line removal

## 2017-07-16 NOTE — Progress Notes (Signed)
SLP Cancellation Note  Patient Details Name: Timothy Le MRN: [id number] DOB: 1960-12-27   Cancelled treatment:        Attempted to see pt for evaluation.  Upon arrival, pt reported increased difficulty breathing.  Pt wearing PMV with humidified air.  When valve was removed, therapist noted dry secretions at the back of the inner cannula which pt was unable to mobilize with superficial suctioning or coughing.  ST called RN to bedside for deep suctioning but RN was initally unable to suction any secretions.  Meanwhile, pt's complaints of difficulty breathing increased and pt became very anxious, yelling "Take the trach out, I can't breathe."  O2 sats began to drop to low 80s and respiratory therapy was called.  By the time RT arrived, RN had been able to remove a large mucus plug and pt's breathing and O2 sats began to return to normal.  Pt was exhausted after episode; therefore, SLP will return at a later time for evaluation.                                                                                                  Maxi Rodas, Selinda Orion 07/16/2017, 11:24 AM

## 2017-07-16 NOTE — Progress Notes (Signed)
Pt seen by trach team for consult. No education needed at this time. All necessary equipment available at bedside. Will continue to monitor for progress. 

## 2017-07-16 NOTE — IPOC Note (Addendum)
Overall Plan of Care [facility]) Patient Details Name: JOREN REHM MRN: [id number] DOB: Jun 26, 1960  Admitting Diagnosis: <principal problem not specified>debility  Hospital Problems: Active Problems:   Debility   Sacral wound   Tracheostomy status (HCC)   PEG (percutaneous endoscopic gastrostomy) status ([place])   Labile blood pressure   Acute blood loss anemia   Anemia of chronic disease   Diabetes mellitus type 2 in obese [facility])     Functional Problem List: Nursing Bladder, Bowel, Edema, Endurance, Medication Management, Nutrition, Safety  PT Balance, Endurance, Motor, Pain, Skin Integrity, Sensory  OT Balance, Safety, Edema, Endurance, Skin Integrity, Motor  SLP Nutrition  TR         Basic ADL's: OT Grooming, Bathing, Dressing, Toileting     Advanced  ADL's: OT Simple Meal Preparation     Transfers: PT Bed Mobility, Bed to Chair, Car, Manufacturing systems engineer, Metallurgist: PT Ambulation, Emergency planning/management officer, Stairs     Additional Impairments: OT None  SLP Swallowing, Communication expression    TR      Anticipated Outcomes Item Anticipated Outcome  Self Feeding No goal  Swallowing  mod I   Basic self-care  Min A  Toileting  Mod I    Bathroom Transfers Supervision/setup-Mod I   Bowel/Bladder  patient will be  continent of bowel and bladder with min assist  Transfers  mod I  Locomotion  mod I  Communication  mod I   Cognition     Pain  pain less than or equal to 4/10 with min assist  Safety/Judgment  Patient will be free from falls/injury and display sound safety judgement with min assist   Therapy Plan: PT Intensity: Minimum of 1-2 x/day ,45 to 90 minutes PT Frequency: 5 out of 7 days PT Duration Estimated Length of Stay: 9-12 days OT Intensity: Minimum of 1-2 x/day, 45 to 90 minutes OT Frequency: 5 out of 7 days OT Duration/Estimated Length of Stay: 14-16 days SLP Intensity: Minumum of 1-2 x/day, 30 to 90 minutes SLP Frequency:  3 to 5 out of 7 days SLP Duration/Estimated Length of Stay: 10-12 days     Team Interventions: Nursing Interventions Patient/Family Education, Disease Management/Prevention, Pain Management, Skin Care/Wound Management, Discharge Planning, Bladder Management, Bowel Management, Medication Management, Dysphagia/Aspiration Precaution Training  PT interventions Ambulation/gait training, Discharge planning, Functional mobility training, Therapeutic Activities, Balance/vestibular training, Neuromuscular re-education, Therapeutic Exercise, Wheelchair propulsion/positioning, UE/LE Strength taining/ROM, Splinting/orthotics, Pain management, DME/adaptive equipment instruction, Community reintegration, Barrister's clerk education, IT trainer, UE/LE Coordination activities  OT Interventions Training and development officer, Discharge planning, Functional electrical stimulation, Pain management, Self Care/advanced ADL retraining, Therapeutic Activities, UE/LE Coordination activities, Visual/perceptual remediation/compensation, Therapeutic Exercise, Skin care/wound managment, Patient/family education, Functional mobility training, Disease mangement/prevention, Cognitive remediation/compensation, Academic librarian, Engineer, drilling, Neuromuscular re-education, Psychosocial support, Splinting/orthotics, UE/LE Strength taining/ROM, Wheelchair propulsion/positioning  SLP Interventions Cueing hierarchy, Internal/external aids, Environmental controls, Patient/family education, Dysphagia/aspiration precaution training  TR Interventions    SW/CM Interventions Discharge Planning, Psychosocial Support, Patient/Family Education   Barriers to Discharge MD  Medical stability  Nursing Inaccessible home environment, Wound Care, Weight    PT Inaccessible home environment, Trach, Lack of/limited family support, Wound Care    OT Trach, Medical stability, Wound Care, Lack of/limited family support, Weight, New  oxygen    SLP Trach    SW       Team Discharge Planning: Destination: PT-Home ,OT- Home , SLP-Home Projected Follow-up: PT-Home health PT, OT-  Home health OT, SLP-Other (comment)(To be determined  pending progress made while inpatient ) Projected Equipment Needs: PT-To be determined, OT- To be determined, SLP-To be determined Equipment Details: PT- , OT-  Patient/family involved in discharge planning: PT- Patient,  OT-Patient, SLP-Patient  MD ELOS: 10-12 days Medical Rehab Prognosis:  Excellent Assessment: The patient has been admitted for CIR therapies with the diagnosis of debility after respiratory failure. The team will be addressing functional mobility, strength, stamina, balance, safety, adaptive techniques and equipment, self-care, bowel and bladder mgt, patient and caregiver education, breathing, communication, activity tolerance, family ed. Goals have been set at mod I for mobility self-care and breathing/communication.Meredith Staggers, MD, FAAPMR      See Team Conference Notes for weekly updates to the plan of care

## 2017-07-16 NOTE — Progress Notes (Signed)
RT called to patient's room by speech therapy stating patient was desating and needed to be seen.  Upon arrival to room, RN had suctioned patient and had removed a mucus plug.  Patient stated could not breathe at that time and he was scared.  RT listened to patient, patient sounded clear and diminished at time.  Patient stated no longer SOB but extremely tired.  Patient sats at 100%.  RT will continue to monitor.

## 2017-07-16 NOTE — Plan of Care (Signed)
LTGs established 07/16/17

## 2017-07-17 ENCOUNTER — Inpatient Hospital Stay (HOSPITAL_COMMUNITY): Payer: Medicaid Other | Admitting: Physical Therapy

## 2017-07-17 ENCOUNTER — Inpatient Hospital Stay (HOSPITAL_COMMUNITY): Payer: Medicaid Other

## 2017-07-17 ENCOUNTER — Inpatient Hospital Stay (HOSPITAL_COMMUNITY): Payer: Medicaid Other | Admitting: Occupational Therapy

## 2017-07-17 DIAGNOSIS — R0989 Other specified symptoms and signs involving the circulatory and respiratory systems: Secondary | ICD-10-CM

## 2017-07-17 DIAGNOSIS — E1169 Type 2 diabetes mellitus with other specified complication: Secondary | ICD-10-CM

## 2017-07-17 DIAGNOSIS — I10 Essential (primary) hypertension: Secondary | ICD-10-CM

## 2017-07-17 DIAGNOSIS — D62 Acute posthemorrhagic anemia: Secondary | ICD-10-CM

## 2017-07-17 DIAGNOSIS — E669 Obesity, unspecified: Secondary | ICD-10-CM

## 2017-07-17 DIAGNOSIS — Z931 Gastrostomy status: Secondary | ICD-10-CM

## 2017-07-17 DIAGNOSIS — D638 Anemia in other chronic diseases classified elsewhere: Secondary | ICD-10-CM

## 2017-07-17 LAB — GLUCOSE, CAPILLARY
GLUCOSE-CAPILLARY: 99 mg/dL (ref 70–99)
GLUCOSE-CAPILLARY: 109 mg/dL — AB (ref 70–99)
GLUCOSE-CAPILLARY: 109 mg/dL — AB (ref 70–99)
Glucose-Capillary: 101 mg/dL — ABNORMAL HIGH (ref 70–99)

## 2017-07-17 NOTE — Progress Notes (Signed)
Wound treatment scheduled for bid was declined tonight. Pt stated he just needed his meds quickly so he can go to sleep. No skin assessment was completed. Stated he has therapy at 8am. Reported pain was a 8. Pain medication was not available till 10:30pm. Pt stated he was not going to wait for due time and if he was sleep when medication is available to please not wake him up. Pt kept a pleasant respectful attitude but agitation was noticeable.

## 2017-07-17 NOTE — Progress Notes (Signed)
Speech Language Pathology Daily Session Note  Patient Details  Name: Timothy Le MRN: [id number] Date of Birth: July 23, 1960  Today's Date: 07/17/2017 SLP Individual Time: 1545-1630 SLP Individual Time Calculation (min): 45 min  Short Term Goals: Week 1: SLP Short Term Goal 1 (Week 1): Pt will donn and doff speaking valve with supervision verbal cues.  SLP Short Term Goal 2 (Week 1): Pt will consume dys 3 textures and thin liquids with mod I use of swallowing precautions and minimal overt s/s of aspiration.  SLP Short Term Goal 3 (Week 1): Pt will consume therapeutic trials of regular textures with mod I use of swallowing precautions and no overt s/s of aspiration.  SLP Short Term Goal 4 (Week 1): Pt will coordinate onset of phonation with respirations to sustain voicing at the sentence level.   Skilled Therapeutic Interventions: Pt was seen for skilled ST targeting language and dysphagia goals. SLP facilitated session by providing verbal education re: donning and doffing PMSV with use of demonstration and hands-on practice with pt successfully don/doffing PMSV on 4 out of 4 trials. PMSV remained donned for the duration of today's session with vitals remaining within acceptable parameters (SpO2: 96-100, HR 86-99, RR 24-28). Pt coordinated onset of phonation with respirations to sustain voicing at the sentence level given Mod-Max verbal cues and model prompts. He consumed ~6 oz of water with intermittent coughing; however, did not appear directly r/t aspiration and appeared consistent with evaluation note; please note, pt with baseline cough. His vocal quality was noted to be clear and dry following each cough. Will continue to monitor closely. At the end of the session, pt reporting difficulty breathing at which time he was repositioned and encouraged to produce strong coughs to clear airway with pt stated that these strategies aided in airway clearance. RN notified. Pt left in bed with call bell  within reach and all needs met + PMSV donned. Continue per current plan of care.     Function: Eating Eating   Modified Consistency Diet: Yes Eating Assist Level: More than reasonable amount of time;Set up assist for           Cognition Comprehension Comprehension assist level: Follows basic conversation/direction with no assist  Expression Expression assistive device: Talk trach valve Expression assist level: Expresses basic needs/ideas: With no assist  Social Interaction Social Interaction assist level: Interacts appropriately 90% of the time - Needs monitoring or encouragement for participation or interaction.  Problem Solving Problem solving assist level: Solves basic problems with no assist  Memory Memory assist level: Recognizes or recalls 90% of the time/requires cueing < 10% of the time    Pain Pain Assessment Pain Scale: 0-10 Pain Score: 7  Pain Type: Acute pain Pain Location: Buttocks Pain Intervention(s): RN made aware;Distraction;Emotional support  Therapy/Group: Individual Therapy  Kateland Leisinger A Tarry Blayney 07/17/2017, 5:25 PM

## 2017-07-17 NOTE — Progress Notes (Signed)
Occupational Therapy Session Note  Patient Details  Name: Timothy Le MRN: [id number] Date of Birth: 1960-10-26  Today's Date: 07/17/2017 OT Individual Time: 5956-3875 OT Individual Time Calculation (min): 70 min   Short Term Goals: Week 1:  OT Short Term Goal 1 (Week 1): Pt will complete bathing with Mod A and AE as needed OT Short Term Goal 2 (Week 1): Pt will complete 1/3 components of donning pants with AE as needed OT Short Term Goal 3 (Week 1): Pt will complete 1/4 components of donning overhead shirt  Skilled Therapeutic Interventions/Progress Updates:    Pt greeted supine in bed. C/o sacral pain with RN in to provide pain medicine and change sacral dressing during session. Began tx with BSC transfer, pt using RW at ambulatory level with steady assist. He completed bathing while sitting on BSC at sit<stand level. Initiated AE training with [place] sponge, with pt able to reach bilateral axillas, upper legs, and torso, and wash these areas thoroughly. Sit<stand with RW x4 for RN to change sacral dressings and for OT to complete perihygiene post BM. He was able to stand for 2 minute windows before requiring rest breaks.   Afterwards pt ambulated back to bed and transferred to supine with Mod A for mgt of LEs. He was repositioned for comfort and left with RN and all needs within reach.   02 sats <95% during session with PMSV. He requested trach collar x2 due to SOB.     Therapy Documentation Precautions:  Precautions Precautions: Fall Precaution Comments: trach Restrictions Weight Bearing Restrictions: No Vital Signs: Therapy Vitals Pulse Rate: 90 BP: 113/65 Patient Position (if appropriate): Lying Oxygen Therapy SpO2: 100 % O2 Device: Tracheostomy Collar O2 Flow Rate (L/min): (5L/min) Pain: Addressed above   ADL: ADL ADL Comments: Please see functional navigator for ADL status     See Function Navigator for Current Functional Status.   Therapy/Group: Individual  Therapy  Aedon Deason A Attie Nawabi 07/17/2017, 9:15 AM

## 2017-07-17 NOTE — Progress Notes (Signed)
Attempted to suction, pt noted with strong cough, was able to clear some secretions but contacted respiratory.

## 2017-07-17 NOTE — Progress Notes (Signed)
Physical Therapy Session Note  Patient Details  Name: Timothy Le MRN: [id number] Date of Birth: Feb 17, 1960  Today's Date: 07/17/2017 PT Individual Time: 1300-1353 PT Individual Time Calculation (min): 53 min   Short Term Goals: Week 1:  PT Short Term Goal 1 (Week 1): = LTG  Skilled Therapeutic Interventions/Progress Updates:   Pt in supine w/ loud, productive cough, and labored breathing. He reports increased fatigue this date and unsure he can get OOB. Pt requesting respiratory therapy come to help suction his trach again. Made RN aware, however RN requesting pt sit up in recliner first to see if positioning helps. This therapist educated pt on benefits of upright sitting for both endurance and clearance of secretions. Pt agreeable. Transferred to EOB w/ min assist and to bariatric recliner via stand pivot w/ min guard w/ RW and verbal cues for safety. Focused on breathing strategies and education on body position while sitting in recliner, able to tolerate sitting up for 25-30 minutes before pain in pt's bottom was too much for him to continue. Breathing did improve in quality while sitting up, O2 sats remained >93% throughout session, pt reports being able to successfully clear more secretions than when in supine. Returned to EOB and to supine w/ min assist. Ended session in supine, call bell within reach and all needs met. Missed 27 min of skilled PT 2/2 fatigue.   Therapy Documentation Precautions:  Precautions Precautions: Fall Precaution Comments: trach Restrictions Weight Bearing Restrictions: No Vital Signs: Therapy Vitals Pulse Rate: 79 Resp: 20 Patient Position (if appropriate): Lying Oxygen Therapy SpO2: 99 % O2 Device: Tracheostomy Collar O2 Flow Rate (L/min): 5 L/min FiO2 (%): 28 % Pain: Pain Assessment Pain Score: 6  Faces Pain Scale: Hurts even more  See Function Navigator for Current Functional Status.   Therapy/Group: Individual Therapy  Mohamedamin Nifong K  Arnette 07/17/2017, 1:54 PM

## 2017-07-17 NOTE — Progress Notes (Addendum)
Pt requested Evonnie Dawes Vaule to be removed before bed. Writer was unable to twist the cap as pt was requesting. Pt stated "just pull straight out." Writer accidentally pulled the inter cannula out of place. Writer immediately requested RN assistance. Corazon RN demonstrated how piece was to be discarded and replace with brand new device #6 uncuffed Shiley. Writer was present with RN the entire time to gain education. Pt showed now distress and sats remained above 95%. O2 was monitored continuously as ordered as well as Probation officer completed frequent visual checks. Pt was awaken at 06:30 for vitals. Still no signs of distress.  Dressing that was scheduled for bid was declined last night. On coming nurse was informed that weight on bed was not correct as well as bed had been beeping all night but air remained in mattress. It was recommended to contact facilities.

## 2017-07-17 NOTE — Plan of Care (Signed)
Continues to have multiple open wounds. Currently on able. Unable to complete care by self.

## 2017-07-17 NOTE — Progress Notes (Signed)
[facility]     PROGRESS NOTE    Subjective/Complaints: Patient seen lying in bed this morning. He states he slept well overnight. He makes limited eye contact.  ROS: Denies CP, SOB, N/V/D  Objective:  No results found. Recent Labs    07/15/17 2124 07/16/17 0356  WBC 7.0 7.2  HGB 8.5* 8.7*  HCT 30.4* 31.1*  PLT 213 217   Recent Labs    07/15/17 2124 07/16/17 0356  NA  --  136  K  --  4.0  CL  --  96*  GLUCOSE  --  116*  BUN  --  15  CREATININE 0.87 0.85  CALCIUM  --  8.7*   CBG (last 3)  Recent Labs    07/16/17 1203 07/16/17 1722 07/16/17 2225  GLUCAP 125* 121* 116*    Wt Readings from Last 3 Encounters:  07/16/17 (!) 173.3 kg (382 lb)  07/15/17 (!) 172.4 kg (380 lb)  01/20/17 (!) 166 kg (366 lb)     Intake/Output Summary (Last 24 hours) at 07/17/2017 0741 Last data filed at 07/17/2017 0647 Gross per 24 hour  Intake 320 ml  Output 2125 ml  Net -1805 ml    Vital Signs: Blood pressure 113/65, pulse 90, temperature 98.6 F (37 C), temperature source Oral, resp. rate 18, height 5\' 6"  (1.676 m), weight (!) 173.3 kg (382 lb), SpO2 100 %. Physical Exam:  Constitutional: No distress . Vital signs reviewed. Obese. HENT: Normocephalic.  Atraumatic. + trach. Eyes: EOMI. No discharge. Cardiovascular: RRR. No JVD. Respiratory: CTA Bilaterally. Normal effort. GI: BS +. Non-distended. +PEG. Musc: lower extremity edema. Neurological: He isalert Motor: 4/5 proximal to distal throughout Skin:Sacral decub not examined today Psychiatric: flat with limited interaction.  Assessment/Plan: 1. Functional deficits secondary to debility after VDRF which require 3+ hours per day of interdisciplinary therapy in a comprehensive inpatient rehab setting. Physiatrist is providing close team supervision and 24 hour management of active medical problems listed below. Physiatrist and rehab team continue to assess barriers to  discharge/monitor patient progress toward functional and medical goals.  Function:  Bathing Bathing position   Position: Sitting EOB  Bathing parts Body parts bathed by patient: Left upper leg, Right upper leg Body parts bathed by helper: Right arm, Left arm, Chest, Abdomen, Front perineal area, Right lower leg, Left lower leg, Back  Bathing assist        Upper Body Dressing/Undressing Upper body dressing   What is the patient wearing?: Hospital gown                Upper body assist Assist Level: Touching or steadying assistance(Pt > 75%)      Lower Body Dressing/Undressing Lower body dressing   What is the patient wearing?: Non-skid slipper socks           Non-skid slipper socks- Performed by helper: Don/doff right sock, Don/doff left sock                  Lower body assist        Toileting Toileting   Toileting steps completed by patient: Adjust clothing prior to toileting, Adjust clothing after toileting Toileting steps completed by helper: Performs perineal hygiene Toileting Assistive Devices: Toilet aid  Toileting assist Assist level: Touching or steadying assistance (Pt.75%)   Transfers Chair/bed transfer     Chair/bed transfer assist level: Moderate assist (Pt 50 - 74%/lift or lower)       Locomotion Ambulation  Max distance: 15 Assist level: Touching or steadying assistance (Pt > 75%)   Wheelchair          Cognition Comprehension Comprehension assist level: Follows basic conversation/direction with no assist  Expression Expression assist level: Expresses basic 90% of the time/requires cueing < 10% of the time.  Social Interaction Social Interaction assist level: Interacts appropriately with others with medication or extra time (anti-anxiety, antidepressant).  Problem Solving Problem solving assist level: Solves basic problems with no assist  Memory Memory assist level: More than reasonable amount of time   Medical Problem List and  Plan: 1.Debilitysecondary to VDRF/multi-medicalissues as noted above  Cont CIR   Notes reviewed, labs reviewed 2. DVT Prophylaxis/Anticoagulation: Subcutaneous Lovenox. Monitor for any bleeding episodes 3. Pain Management:Tylenol as needed 4. Mood:Provide emotional support 5. Neuropsych: This patientiscapable of making decisions on hisown behalf. 6. Skin/Wound Care/sacrococcyx decubitus:area clean  -continue packing of sacral wound, local care, air mattress  -appreciate WOC RN follow up 7. Fluids/Electrolytes/Nutrition:good po intake so far  BMP within acceptable range on 6/28 8.Tracheostomy 06/17/2017 per Dr. Cleaster Corin to cuffless XLT #6 07/09/2017.    Speech therapy follow-up   -continue PMV.  -NO PLAN FOR DECANNULATION UNTIL WEIGHT LOSS 9.Gastrostomy tube 07/05/2017. Diet advanced to mechanical soft thin liquids. 10.Diabetes mellitus. Hemoglobin A1c 5.4. Lantus insulin 10 units nightly.   Relatively controlled on 6/29 11.Morbid obesity. BMI 51.81. Follow-up dietary 12.Acute on chronic anemia.   Hb 8.7 on 6/28 13.Hypertension. Demadex 10 mg daily.  Labile on 6/29 14. Morbid obesity  Encourage weight loss  LOS (Days) 2 A FACE TO FACE EVALUATION WAS PERFORMED  Terrel Manalo Lorie Phenix, MD 07/17/2017 7:41 AM

## 2017-07-18 ENCOUNTER — Inpatient Hospital Stay (HOSPITAL_COMMUNITY): Payer: Medicaid Other | Admitting: Physical Therapy

## 2017-07-18 ENCOUNTER — Inpatient Hospital Stay (HOSPITAL_COMMUNITY): Payer: Medicaid Other | Admitting: Occupational Therapy

## 2017-07-18 LAB — GLUCOSE, CAPILLARY
GLUCOSE-CAPILLARY: 92 mg/dL (ref 70–99)
GLUCOSE-CAPILLARY: 117 mg/dL — AB (ref 70–99)
GLUCOSE-CAPILLARY: 99 mg/dL (ref 70–99)
GLUCOSE-CAPILLARY: 109 mg/dL — AB (ref 70–99)

## 2017-07-18 NOTE — Progress Notes (Signed)
Physical Therapy Session Note  Patient Details  Name: Timothy Le MRN: [id number] Date of Birth: 30-Mar-1960  Today's Date: 07/18/2017 PT Individual Time: 1300-1344 PT Individual Time Calculation (min): 44 min   Short Term Goals: Week 1:  PT Short Term Goal 1 (Week 1): = LTG  Skilled Therapeutic Interventions/Progress Updates:  Pt was seen bedside in the pm. Pt willing to participate with therapy. Pt transferred supine to edge of bed with side rail, head of bed elevated and min A. Pt tolerated EOB about 20 minutes with S. Pt transferred sit to stand and stand pivot with rolling walker and min A. While standing with walker pt performed marching with B LEs. Pt ambulated 5 and 8 feet with rolling walker and min A with verbal cues. Assisted pt to don Tshirt and underwear. Pt sitting up in chair with family at bedside and call bell within reach.   Therapy Documentation Precautions:  Precautions Precautions: Fall Precaution Comments: trach Restrictions Weight Bearing Restrictions: No General:   Vital Signs: Therapy Vitals Oxygen Therapy SpO2: 99 % O2 Device: Tracheostomy Collar O2 Flow Rate (L/min): 5 L/min FiO2 (%): 28 % Pain: No c/o pain throughout treatment.   See Function Navigator for Current Functional Status.   Therapy/Group: Individual Therapy  Dub Amis 07/18/2017, 1:47 PM

## 2017-07-18 NOTE — Progress Notes (Signed)
[facility]ILIT[facility]     PROGRESS NOTE    Subjective/Complaints: Patient seen lying in bed this morning. He slept well overnight. Per nursing, patient only required suctioning 1.  ROS: denies CP, SOB, N/V/D  Objective:  No results found. Recent Labs    07/15/17 2124 07/16/17 0356  WBC 7.0 7.2  HGB 8.5* 8.7*  HCT 30.4* 31.1*  PLT 213 217   Recent Labs    07/15/17 2124 07/16/17 0356  NA  --  136  K  --  4.0  CL  --  96*  GLUCOSE  --  116*  BUN  --  15  CREATININE 0.87 0.85  CALCIUM  --  8.7*   CBG (last 3)  Recent Labs    07/17/17 1700 07/17/17 2024 07/18/17 0633  GLUCAP 109* 109* 92    Wt Readings from Last 3 Encounters:  07/17/17 (!) 178.3 kg (393 lb)  07/15/17 (!) 172.4 kg (380 lb)  01/20/17 (!) 166 kg (366 lb)     Intake/Output Summary (Last 24 hours) at 07/18/2017 0745 Last data filed at 07/17/2017 2100 Gross per 24 hour  Intake 720 ml  Output 700 ml  Net 20 ml    Vital Signs: Blood pressure 117/69, pulse 90, temperature 99.3 F (37.4 C), temperature source Oral, resp. rate 16, height 5\' 6"  (1.676 m), weight (!) 178.3 kg (393 lb), SpO2 97 %. Physical Exam:  Constitutional: No distress . Vital signs reviewed. Obese. HENT: Normocephalic.  Atraumatic. +trach. Eyes: EOMI. No discharge. Cardiovascular: RRR. No JVD. Respiratory: CTA Bilaterally. Normal effort. GI: BS +. Non-distended. +PEG. Musc: lower extremity edema. Neurological: He isalert Motor: 4/5 proximal to distal throughout (unchanged) Skin:Sacral decub not examined today Psychiatric: flat with limited interaction.  Assessment/Plan: 1. Functional deficits secondary to debility after VDRF which require 3+ hours per day of interdisciplinary therapy in a comprehensive inpatient rehab setting. Physiatrist is providing close team supervision and 24 hour management of active medical problems listed below. Physiatrist and rehab team continue to assess barriers to  discharge/monitor patient progress toward functional and medical goals.  Function:  Bathing Bathing position   Position: Other (comment)(sitting on BSC)  Bathing parts Body parts bathed by patient: Left upper leg, Right upper leg, Chest, Front perineal area, Right arm, Left arm Body parts bathed by helper: Abdomen, Buttocks, Right lower leg, Left lower leg, Back  Bathing assist        Upper Body Dressing/Undressing Upper body dressing   What is the patient wearing?: Hospital gown                Upper body assist Assist Level: Touching or steadying assistance(Pt > 75%)      Lower Body Dressing/Undressing Lower body dressing   What is the patient wearing?: Non-skid slipper socks           Non-skid slipper socks- Performed by helper: Don/doff right sock, Don/doff left sock                  Lower body assist        Toileting Toileting Toileting activity did not occur: N/A Toileting steps completed by patient: Adjust clothing prior to toileting, Adjust clothing after toileting Toileting steps completed by helper: Performs perineal hygiene Toileting Assistive Devices: Toilet aid  Toileting assist Assist level: Touching or steadying assistance (Pt.75%)   Transfers Chair/bed transfer   Chair/bed transfer method: Stand pivot Chair/bed transfer assist level: Touching or steadying assistance (Pt > 75%) Chair/bed transfer  assistive device: Armrests, Medical sales representative     Max distance: 15 Assist level: Touching or steadying assistance (Pt > 75%)   Wheelchair          Cognition Comprehension Comprehension assist level: Follows basic conversation/direction with no assist  Expression Expression assist level: Expresses basic needs/ideas: With no assist  Social Interaction Social Interaction assist level: Interacts appropriately 90% of the time - Needs monitoring or encouragement for participation or interaction.  Problem Solving Problem solving  assist level: Solves basic problems with no assist  Memory Memory assist level: Recognizes or recalls 90% of the time/requires cueing < 10% of the time   Medical Problem List and Plan: 1.Debilitysecondary to VDRF/multi-medicalissues as noted above  Cont CIR 2. DVT Prophylaxis/Anticoagulation: Subcutaneous Lovenox. Monitor for any bleeding episodes 3. Pain Management:Tylenol as needed 4. Mood:Provide emotional support 5. Neuropsych: This patientiscapable of making decisions on hisown behalf. 6. Skin/Wound Care/sacrococcyx decubitus:area clean  -continue packing of sacral wound, local care, air mattress  -appreciate WOC RN follow up 7. Fluids/Electrolytes/Nutrition:good po intake so far  BMP within acceptable range on 6/28 8.Tracheostomy 06/17/2017 per Dr. Cleaster Corin to cuffless XLT #6 07/09/2017.    Speech therapy follow-up   -continue PMV.  -NO PLAN FOR DECANNULATION UNTIL WEIGHT LOSS 9.Gastrostomy tube 07/05/2017. Diet advanced to mechanical soft thin liquids. 10.Diabetes mellitus. Hemoglobin A1c 5.4. Lantus insulin 10 units nightly.   Relatively controlled on 6/30 11.Morbid obesity. BMI 51.81. Follow-up dietary  Continue to educate 12.Acute on chronic anemia.   Hb 8.7 on 6/28 13.Hypertension. Demadex 10 mg daily.  Relatively controlled on 6/30 14. Morbid obesity  Encourage weight loss  LOS (Days) 3 A FACE TO FACE EVALUATION WAS PERFORMED  Ankit Lorie Phenix, MD 07/18/2017 7:45 AM

## 2017-07-18 NOTE — Progress Notes (Signed)
Occupational Therapy Session Note  Patient Details  Name: Timothy Le MRN: [id number] Date of Birth: Mar 09, 1960  Today's Date: 07/18/2017 OT Individual Time: 4709-6283 OT Individual Time Calculation (min): 40 min   Short Term Goals: Week 1:  OT Short Term Goal 1 (Week 1): Pt will complete bathing with Mod A and AE as needed OT Short Term Goal 2 (Week 1): Pt will complete 1/3 components of donning pants with AE as needed OT Short Term Goal 3 (Week 1): Pt will complete 1/4 components of donning overhead shirt  Skilled Therapeutic Interventions/Progress Updates:    Pt greeted supine in bed. Reporting "12/10" pain from buttocks/wounds. RN made aware and pt was not due for pain medicine. Supine<sit completed with extra time and steady assist. Started AE training with reacher and sock aide. Before pt was able to attempt to use equipment, he suddenly c/o worsening pain, SOB, and that he was slipping forward on the bed. Mod A and calming cues provided for returning safely back to supine. 02 sats 99% with PMSV. He was able to boost himself up in bed with instruction. While semi reclined, 02 sats in low 90s with trach collar (per request). Educated pt on importance of OOB tolerance for healing of wounds/pressure relief and increasing ease of respiration. He still refused to get OOB, and therefore we worked on pressure relief positioning using props in bed. Also guided pt through deep breathing exercises with use of lavender essential oil (via inhalation) to increase calmness and ease of breathing. Pt was left with all needs within reach in Lt sidelying position.   Therapy Documentation Precautions:  Precautions Precautions: Fall Precaution Comments: trach Restrictions Weight Bearing Restrictions: No Vital Signs: Therapy Vitals Pulse Rate: 95 Resp: 18 Patient Position (if appropriate): Lying Oxygen Therapy SpO2: 99 % O2 Device: Tracheostomy Collar O2 Flow Rate (L/min): 5 L/min FiO2 (%): 28  % ADL: ADL ADL Comments: Please see functional navigator for ADL status    See Function Navigator for Current Functional Status.   Therapy/Group: Individual Therapy  [name] [name] 07/18/2017, 12:45 PM

## 2017-07-18 NOTE — Progress Notes (Signed)
PMV removed at HS, 28% TC in place. Suctioned X 1 at 2330, small amount of thick yellow secretions. O2 sats 93-100%. IC exchanged this AM. Requests dressing changes this AM with OT. Foam dressing under trach flange, C,D, & I.  PRN Oxy IR given at 2002 and 0634, for C/O sacral pain. Meds given via PEG. Declined scheduled colace, nystatin and aquaphor. Timothy Le A

## 2017-07-19 ENCOUNTER — Inpatient Hospital Stay (HOSPITAL_COMMUNITY): Payer: Medicaid Other | Admitting: Occupational Therapy

## 2017-07-19 ENCOUNTER — Inpatient Hospital Stay (HOSPITAL_COMMUNITY): Payer: Medicaid Other

## 2017-07-19 DIAGNOSIS — E785 Hyperlipidemia, unspecified: Secondary | ICD-10-CM

## 2017-07-19 LAB — GLUCOSE, CAPILLARY
GLUCOSE-CAPILLARY: 97 mg/dL (ref 70–99)
GLUCOSE-CAPILLARY: 119 mg/dL — AB (ref 70–99)
Glucose-Capillary: 95 mg/dL (ref 70–99)
Glucose-Capillary: 115 mg/dL — ABNORMAL HIGH (ref 70–99)

## 2017-07-19 MED ORDER — DOXYCYCLINE HYCLATE 100 MG PO TABS
100.0000 mg | ORAL_TABLET | Freq: Two times a day (BID) | ORAL | Status: AC
  Administered 2017-07-19: 100 mg via ORAL
  Filled 2017-07-19: qty 1

## 2017-07-19 MED ORDER — ATORVASTATIN CALCIUM 10 MG PO TABS
10.0000 mg | ORAL_TABLET | Freq: Every day | ORAL | Status: DC
  Administered 2017-07-19 – 2017-07-28 (×10): 10 mg via ORAL
  Filled 2017-07-19 (×10): qty 1

## 2017-07-19 NOTE — Progress Notes (Signed)
[facility]IC[facility] & REHABILITATION     PROGRESS NOTE    Subjective/Complaints: Patient seen lying in bed this morning. He states he did not sleep well overnight because people Coming into His Room to Check on Him.  ROS: denies CP, SOB, N/V/D  Objective:  No results found. No results for input(s): WBC, HGB, HCT, PLT in the last 72 hours. No results for input(s): NA, K, CL, GLUCOSE, BUN, CREATININE, CALCIUM in the last 72 hours.  Invalid input(s): CO CBG (last 3)  Recent Labs    07/18/17 1653 07/18/17 2122 07/19/17 0642  GLUCAP 99 109* 97    Wt Readings from Last 3 Encounters:  07/19/17 (!) 181.4 kg (400 lb)  07/15/17 (!) 172.4 kg (380 lb)  01/20/17 (!) 166 kg (366 lb)     Intake/Output Summary (Last 24 hours) at 07/19/2017 0916 Last data filed at 07/19/2017 0700 Gross per 24 hour  Intake 360 ml  Output 1300 ml  Net -940 ml    Vital Signs: Blood pressure 130/73, pulse 99, temperature 99 F (37.2 C), temperature source Oral, resp. rate 20, height 5\' 6"  (1.676 m), weight (!) 181.4 kg (400 lb), SpO2 97 %. Physical Exam:  Constitutional: No distress . Vital signs reviewed. Obese. HENT: Normocephalic.  Atraumatic. +trach. Eyes: EOMI. No discharge. Cardiovascular: RRR. No JVD. Respiratory: CTA Bilaterally. Normal effort. GI: BS +. Non-distended. +PEG. Musc: lower extremity edema. Neurological: He isalert Motor: 4/5 proximal to distal throughout (stable) Skin:Sacral decub not examined today Psychiatric: Normal motor. Normal behavior.  Assessment/Plan: 1. Functional deficits secondary to debility after VDRF which require 3+ hours per day of interdisciplinary therapy in a comprehensive inpatient rehab setting. Physiatrist is providing close team supervision and 24 hour management of active medical problems listed below. Physiatrist and rehab team continue to assess barriers to discharge/monitor patient progress toward functional and medical  goals.  Function:  Bathing Bathing position   Position: Other (comment)(sitting on BSC)  Bathing parts Body parts bathed by patient: Left upper leg, Right upper leg, Chest, Front perineal area, Right arm, Left arm Body parts bathed by helper: Abdomen, Buttocks, Right lower leg, Left lower leg, Back  Bathing assist        Upper Body Dressing/Undressing Upper body dressing   What is the patient wearing?: Hospital gown                Upper body assist Assist Level: Touching or steadying assistance(Pt > 75%)      Lower Body Dressing/Undressing Lower body dressing   What is the patient wearing?: Non-skid slipper socks           Non-skid slipper socks- Performed by helper: Don/doff right sock, Don/doff left sock                  Lower body assist        Toileting Toileting Toileting activity did not occur: N/A Toileting steps completed by patient: Adjust clothing prior to toileting, Adjust clothing after toileting Toileting steps completed by helper: Adjust clothing prior to toileting, Performs perineal hygiene, Adjust clothing after toileting Toileting Assistive Devices: Toilet aid  Toileting assist Assist level: Touching or steadying assistance (Pt.75%)   Transfers Chair/bed transfer   Chair/bed transfer method: Stand pivot Chair/bed transfer assist level: Touching or steadying assistance (Pt > 75%) Chair/bed transfer assistive device: Armrests, Medical sales representative     Max distance: 8 Assist level: Touching or steadying assistance (Pt > 75%)   Wheelchair  Cognition Comprehension Comprehension assist level: Follows basic conversation/direction with no assist  Expression Expression assist level: Expresses basic needs/ideas: With no assist  Social Interaction Social Interaction assist level: Interacts appropriately 90% of the time - Needs monitoring or encouragement for participation or interaction.  Problem Solving Problem  solving assist level: Solves basic 90% of the time/requires cueing < 10% of the time  Memory Memory assist level: Recognizes or recalls 90% of the time/requires cueing < 10% of the time   Medical Problem List and Plan: 1.Debilitysecondary to VDRF/multi-medicalissues as noted above  Cont CIR 2. DVT Prophylaxis/Anticoagulation: Subcutaneous Lovenox. Monitor for any bleeding episodes 3. Pain Management:Tylenol as needed 4. Mood:Provide emotional support 5. Neuropsych: This patientiscapable of making decisions on hisown behalf. 6. Skin/Wound Care/sacrococcyx decubitus:area clean  continue packing of sacral wound, local care, air mattress  appreciate WOC RN follow up  Doxycycline DC'd after 7/1 7. Fluids/Electrolytes/Nutrition:good po intake so far  BMP within acceptable range on 6/28 8.Tracheostomy 06/17/2017 per Dr. Cleaster Corin to cuffless XLT #6 07/09/2017.    Speech therapy follow-up   -continue PMV.  -NO PLAN FOR DECANNULATION UNTIL WEIGHT LOSS 9.Gastrostomy tube 07/05/2017. Diet advanced to mechanical soft thin liquids. 10.Diabetes mellitus. Hemoglobin A1c 5.4. Lantus insulin 10 units nightly.   Relatively controlled on 7/1 11.Morbid obesity. BMI 51.81. Follow-up dietary  Continue to educate 12.Acute on chronic anemia.   Hb 8.7 on 6/28 13.Hypertension. Demadex 10 mg daily.  Relatively controlled on 7/1 14. Morbid obesity  Encourage weight loss 15. HLD  Lipitor started on 7/1  LOS (Days) 4 A FACE TO FACE EVALUATION WAS PERFORMED  Ahlivia Salahuddin Lorie Phenix, MD 07/19/2017 9:16 AM

## 2017-07-19 NOTE — Care Management (Signed)
Inpatient [facility] Individual Statement of Services  Patient Name:  Timothy Le  Date:  07/19/2017  Welcome to the [facility].  Our goal is to provide you with an individualized program based on your diagnosis and situation, designed to meet your specific needs.  With this comprehensive rehabilitation program, you will be expected to participate in at least 3 hours of rehabilitation therapies Monday-Friday, with modified therapy programming on the weekends.  Your rehabilitation program will include the following services:  Physical Therapy (PT), Occupational Therapy (OT), Speech Therapy (ST), 24 hour per day rehabilitation nursing, Therapeutic Recreaction (TR), Neuropsychology, Case Management (Social Worker), Rehabilitation Medicine, Nutrition Services and Pharmacy Services  Weekly team conferences will be held on Tuesdays to discuss your progress.  Your Social Worker will talk with you frequently to get your input and to update you on team discussions.  Team conferences with you and your family in attendance may also be held.  Expected length of stay: 9-12 days    Overall anticipated outcome: independent to min assist  Depending on your progress and recovery, your program may change. Your Social Worker will coordinate services and will keep you informed of any changes. Your Social Worker's name and contact numbers are listed  below.  The following services may also be recommended but are not provided by the [facility] will be made to provide these services after discharge if needed.  Arrangements include referral to agencies that provide these services.  Your insurance has been verified to be:  Medicaid Your primary doctor is:  Juluis Mire  Pertinent information will be shared with your doctor and your  insurance company.  Social Worker:  [place], [place] or [phone]   Information discussed with and copy given to patient by: Lennart Pall, 07/19/2017, 2:02 PM

## 2017-07-19 NOTE — Progress Notes (Signed)
Speech Language Pathology Daily Session Note  Patient Details  Name: Timothy Le MRN: [id number] Date of Birth: Sep 24, 1960  Today's Date: 07/19/2017 SLP Individual Time: 1130-1200 SLP Individual Time Calculation (min): 30 min  Short Term Goals: Week 1: SLP Short Term Goal 1 (Week 1): Pt will donn and doff speaking valve with supervision verbal cues.  SLP Short Term Goal 2 (Week 1): Pt will consume dys 3 textures and thin liquids with mod I use of swallowing precautions and minimal overt s/s of aspiration.  SLP Short Term Goal 3 (Week 1): Pt will consume therapeutic trials of regular textures with mod I use of swallowing precautions and no overt s/s of aspiration.  SLP Short Term Goal 4 (Week 1): Pt will coordinate onset of phonation with respirations to sustain voicing at the sentence level.   Skilled Therapeutic Interventions: Skilled treatment session focused on education on PMSV. Pt wearing as SLP entered room. Pt with good coordination of phonation with respirations as indicated by increased vocal intensity at the sentence level to achieve ~ 95% intelligibility. With supervision questions, pt able to provide information on when to wear PMSV and when to remove it. Pt hesitant to take off d/t inner canula being removed on attempt on previous day. Pt's voice remained clear throughout without any wetness or coughing. Continue per current plan of care.      Function:    Cognition Comprehension Comprehension assist level: Follows basic conversation/direction with no assist  Expression Expression assistive device: Talk trach valve Expression assist level: Expresses basic needs/ideas: With no assist  Social Interaction Social Interaction assist level: Interacts appropriately 90% of the time - Needs monitoring or encouragement for participation or interaction.  Problem Solving Problem solving assist level: Solves basic 90% of the time/requires cueing < 10% of the time  Memory Memory assist  level: Recognizes or recalls 90% of the time/requires cueing < 10% of the time    Pain    Therapy/Group: Individual Therapy  Vanna Shavers 07/19/2017, 12:33 PM

## 2017-07-19 NOTE — Progress Notes (Addendum)
Physical Therapy Session Note  Patient Details  Name: Timothy Le MRN: [id number] Date of Birth: 01/03/1961  Today's Date: 07/19/2017 PT Individual Time: 0900-1024 PT Individual Time Calculation (min): 84 min   Short Term Goals: Week 1:  PT Short Term Goal 1 (Week 1): = LTG  Skilled Therapeutic Interventions/Progress Updates:    Pt supine in bed upon PT arrival, agreeable to therapy tx and reports pain 7/10 in sacral region from wound, just received pain medicine from RN. Pt willing to get up OOB and into w/c to get out of room this session. Therapist provided pt with roho cushion in w/c to provide appropriate pressure relief. Pt transferred to EOB with supervision and verbal cues for safety. Pt seated EOB with supervision while therapist looped LEs through pants, pt performed sit<>stand with RW and min assist, pulled pants over hips. Pt performed stand pivot transfer from bed>w/c with RW and min assist. Pt transported to the gym. Pt performed seated therex including 2 x 10 B LAQ and seated marches for strengthening. Pt worked on endurance and activity tolerance to perform gait x 22 ft, x 38 ft, x 31 ft and x 22 ft with RW and min assist, w/c follow for safety. Pt transferred to bariatric recliner in the room with min assist. Pt left seated with needs in reach at end of session. Therapist discussed d/c planning including when pt would have his things moved to a 1st floor apartment. Pt states that his niece would move his things but it would probably be 3-4 weeks before that would happen. Therapist educated pt on current estimated length of stay, also discussed the possibility of going to another facility after this.   Therapy Documentation Precautions:  Precautions Precautions: Fall Precaution Comments: trach Restrictions Weight Bearing Restrictions: No   See Function Navigator for Current Functional Status.   Therapy/Group: Individual Therapy  Netta Corrigan, PT, DPT 07/19/2017,  7:52 AM

## 2017-07-19 NOTE — Progress Notes (Signed)
Occupational Therapy Session Note  Patient Details  Name: SKANDA WORLDS MRN: [id number] Date of Birth: 01/14/1961  Today's Date: 07/19/2017 OT Individual Time: 1100-1145 OT Individual Time Calculation (min): 45 min    Short Term Goals: Week 1:  OT Short Term Goal 1 (Week 1): Pt will complete bathing with Mod A and AE as needed OT Short Term Goal 2 (Week 1): Pt will complete 1/3 components of donning pants with AE as needed OT Short Term Goal 3 (Week 1): Pt will complete 1/4 components of donning overhead shirt  Skilled Therapeutic Interventions/Progress Updates:    Pt seen for OT ADL bathing/dressing session. Pt sitting up in recliner upon arrival, voicing increased pain in buttock, RN made aware and medication provided at end of session. Pt agreeable to tx session and bathing at sponge level. Pt completed UB bathing with set-up assist, required significantly increased time and multiple rest breaks throughout seated ADLs due to decreased functional activity tolerance. Pt's O2 states remained >95% throughout session on 5L supplemental O2. Pt's HR 105-110 throughout. Pt voiced too much pain and fatigue to complete LB bathing and therefore completed total A. Pt able to complete 3/3 parts of donning pants, no AD required. He stood with RW and steadying assist to pull pants up, tolerated ~5-10 seconds in standing before requiring seated rest break. Pt left seated in recliner at end of session, education provided for how to control beri-chair into recliner position if needed. All needs in reach.   Therapy Documentation Precautions:  Precautions Precautions: Fall Precaution Comments: trach Restrictions Weight Bearing Restrictions: No ADL: ADL ADL Comments: Please see functional navigator for ADL status  See Function Navigator for Current Functional Status.   Therapy/Group: Individual Therapy  Neely Cecena L 07/19/2017, 7:19 AM

## 2017-07-19 NOTE — Progress Notes (Signed)
Occupational Therapy Session Note  Patient Details  Name: Timothy Le MRN: [id number] Date of Birth: April 26, 1960  Today's Date: 07/19/2017 OT Individual Time: 9678-9381 OT Individual Time Calculation (min): 55 min    Short Term Goals: Week 1:  OT Short Term Goal 1 (Week 1): Pt will complete bathing with Mod A and AE as needed OT Short Term Goal 2 (Week 1): Pt will complete 1/3 components of donning pants with AE as needed OT Short Term Goal 3 (Week 1): Pt will complete 1/4 components of donning overhead shirt  Skilled Therapeutic Interventions/Progress Updates:    Treatment session with focus on activity tolerance and BUE strengthening.  Pt received supine in bed asleep, but easily aroused.  Pt declined OOB activity at this time as he reports recently returning to bed and having excruciating pain in buttocks and recently receiving pain meds.  Engaged in [place] with level 4 (blue) theraband in semi-reclined position due to pain.  Provided pt with HEP, demonstrated exercises, and pt able to return demonstration.  Pt demonstrating good hold of each exercise.  Therapist providing constant cues for breathing during exercise, encouraging pt to count reps to decrease holding of breath as pt O2 sats would drop during exercise.  Pt required frequent breaks during exercise for breathing with sats returning to >97%.  Pt appreciative of theraband and exercises.  Therapy Documentation Precautions:  Precautions Precautions: Fall Precaution Comments: trach Restrictions Weight Bearing Restrictions: No General:   Vital Signs: Oxygen Therapy SpO2: 99 % O2 Device: Tracheostomy Collar O2 Flow Rate (L/min): 5 L/min FiO2 (%): 28 % Pain: Pain Assessment Pain Scale: 0-10 Pain Score: 10-Worst pain ever Faces Pain Scale: Hurts worst Pain Type: Chronic pain Pain Location: Buttocks Pain Orientation: Posterior Pain Descriptors / Indicators: Aching;Burning;Constant;Tender Pain Onset:  On-going Patients Stated Pain Goal: 0 Pain Intervention(s): Medication (See eMAR);Emotional support;Environmental changes;Massage;Rest Multiple Pain Sites: No  See Function Navigator for Current Functional Status.   Therapy/Group: Individual Therapy  [name] 07/19/2017, 4:14 PM

## 2017-07-20 ENCOUNTER — Inpatient Hospital Stay (HOSPITAL_COMMUNITY): Payer: Medicaid Other | Admitting: Physical Therapy

## 2017-07-20 ENCOUNTER — Inpatient Hospital Stay (HOSPITAL_COMMUNITY): Payer: Medicaid Other | Admitting: Occupational Therapy

## 2017-07-20 ENCOUNTER — Encounter (HOSPITAL_COMMUNITY): Payer: Medicaid Other | Admitting: Psychology

## 2017-07-20 ENCOUNTER — Inpatient Hospital Stay (HOSPITAL_COMMUNITY): Payer: Medicaid Other

## 2017-07-20 DIAGNOSIS — I10 Essential (primary) hypertension: Secondary | ICD-10-CM

## 2017-07-20 LAB — GLUCOSE, CAPILLARY
GLUCOSE-CAPILLARY: 130 mg/dL — AB (ref 70–99)
Glucose-Capillary: 94 mg/dL (ref 70–99)
Glucose-Capillary: 106 mg/dL — ABNORMAL HIGH (ref 70–99)
Glucose-Capillary: 115 mg/dL — ABNORMAL HIGH (ref 70–99)

## 2017-07-20 NOTE — Patient Care Conference (Signed)
Inpatient RehabilitationTeam Conference and Plan of Care Update Date: 07/20/2017   Time: 10:30 AM    Patient Name: Timothy Le      Medical Record Number: [id number]  Date of Birth: 1960/02/12 Sex: Male         Room/Bed: 4W15C/4W15C-01 Payor Info: Payor: MEDICAID [place] / Plan: MEDICAID [place] ACCESS / Product Type: *No Product type* /    Admitting Diagnosis: debility  Admit Date/Time:  07/15/2017  4:25 PM Admission Comments: No comment available   Primary Diagnosis:  <principal problem not specified> Principal Problem: <principal problem not specified>  Patient Active Problem List   Diagnosis Date Noted  . Benign essential HTN   . Hyperlipidemia   . PEG (percutaneous endoscopic gastrostomy) status ([place])   . Labile blood pressure   . Acute blood loss anemia   . Anemia of chronic disease   . Diabetes mellitus type 2 in obese ([employer])   . Sacral wound   . Tracheostomy status ([place])   . Debility 07/15/2017  . Chronic respiratory failure with hypoxia ([place]) 07/09/2017  . Abscess of groin, left   . Encounter for PEG (percutaneous endoscopic gastrostomy) ([place])   . Acute pulmonary edema ([facility])   . HCAP (healthcare-associated pneumonia)   . ARDS (adult respiratory distress syndrome) ([place])   . Pressure injury of skin 06/19/2017  . Difficult airway for intubation   . Respiratory distress 06/09/2017  . Respiratory failure with hypoxia and hypercapnia ([place]) 06/09/2017  . COPD with acute exacerbation ([place]) 06/17/2016  . Seasonal and perennial allergic rhinitis 05/24/2016  . AKI (acute kidney injury) ([place]) 04/08/2015  . Hypovolemic shock ([place]) 04/08/2015  . Sepsis ([place]) 04/08/2015  . Hyponatremia 04/08/2015  . HTN (hypertension) 03/28/2015  . Near syncope 03/28/2015  . Type 2 diabetes mellitus with hyperosmolar nonketotic hyperglycemia ([place]) 03/28/2015  . Hyperglycemia 03/27/2015  . Acute on chronic respiratory failure with hypoxemia ([place]) 03/27/2015  . Acute on chronic congestive heart  failure ([place])   . COPD exacerbation ([place]) 02/07/2015  . Pressure ulcer 12/28/2014  . COPD mixed type ([place]) 12/27/2014  . Acute respiratory failure ([place]) 12/27/2014  . Morbid obesity ([place]) 12/27/2014  . Diabetes mellitus type 2, controlled ([place]) 12/27/2014    Class: Chronic  . Obstructive sleep apnea 07/29/2013  . Peripheral edema 07/29/2013    Expected Discharge Date: Expected Discharge Date: 07/28/17  Team Members Present: Physician leading conference: Dr. Delice Lesch Social Worker Present: Lennart Pall, LCSW Nurse Present: Frances Maywood, RN PT Present: Canary Brim, PT OT Present: Benay Pillow, OT SLP Present: Charolett Bumpers, SLP PPS Coordinator present : Daiva Nakayama, RN, CRRN     Current Status/Progress Goal Weekly Team Focus  Medical   Debility secondary to VDRF/multi-medical issues  Improve mobility, endurance, transfers, self-care  See above   Bowel/Bladder   continent of bowel and bladder LBM 07-18-17  remain continent of bowel and bladder, maintain regular bowel pattern  Assist with toileting needs    Swallow/Nutrition/ Hydration   dys 3 and thin , supervision A  Mod I  carryover of strategies and trials of regular    ADL's   Set-up/supervision UB bathing/dressing, max A LB bathing/dressing; min A functional transfers with RW from elevated surface. Requires significantly increased time and rest breaks in order to complete any ADL task  Supervision-mod I overall; min A toileting  ADL re-training, functional activity tolerance, functional transfers, functional standing balance and endurance   Mobility   supervision bed mobility, min assist stand pivot transfer and gait  up to 30 ft with RW  independent with AD  endurance, activity tolerance, gait, transfers   Communication   sentence level intelligibilty, and PMVS use, min-supervision A  Mod I  doning on/off PMVS, tolerance during all waking hours, cordinating phonation and respirtaion    Safety/Cognition/ Behavioral  Observations            Pain   pain managed with oxy 5-10mg  q4   pain < or = 5  Assess pain q shift and prn   Skin   stage 3 trach,stage 4 sacrum stage 2 bottom, left groin abccess,  healing wound, no new skin issues  Dressing changes as ordered, encourage patient to stay off bottom    Rehab Goals Patient on target to meet rehab goals: Yes *See Care Plan and progress notes for long and short-term goals.     Barriers to Discharge  Current Status/Progress Possible Resolutions Date Resolved   Physician    Medical stability;Trach;Wound Care;Weight     See above  Therapies, follow [place] home environment;Trach;Lack of/limited family support;Wound Care  14 [address], will not be able to move to 1st floor apt for 3-4 weeks              OT Trach;Medical stability;Wound Care;Lack of/limited family support;Weight;New oxygen                SLP Trach              SW                Discharge Planning/Teaching Needs:  Pt to d/c home with only intermittent assistance available from family.  Teaching will need to be completed for trach, wound care with pt and likely with family as well.   Team Discussion:  ABX completed;  BP and DM under control. Continue to care for sacral wound and await further information from [place] to determine expected care plan for wounds upon d/c.  Min assist with tfs.  Very poor endurance.  Tolerating PMSV.  D3, thin and does much better if sitting up for meals.  May need to change toileting goals.  Family may need to provide more assist than originally thought.  SW discussing what will be needed in terms of education for d/c home with trach.  Need to confirm status of securing new apt with family - SW to follow up.  Revisions to Treatment Plan:  NA    Continued Need for Acute Rehabilitation Level of Care: The patient requires daily medical management by a physician with specialized training in physical medicine and rehabilitation  for the following conditions: Daily direction of a multidisciplinary physical rehabilitation program to ensure safe treatment while eliciting the highest outcome that is of practical value to the patient.: Yes Daily medical management of patient stability for increased activity during participation in an intensive rehabilitation regime.: Yes Daily analysis of laboratory values and/or radiology reports with any subsequent need for medication adjustment of medical intervention for : Post surgical problems;Wound care problems;Diabetes problems;Blood pressure problems  Micky Overturf 07/20/2017, 11:49 AM

## 2017-07-20 NOTE — Progress Notes (Addendum)
Speech Language Pathology Daily Session Note  Patient Details  Name: MYCAL CONDE MRN: [id number] Date of Birth: 12/13/1960  Today's Date: 07/20/2017 SLP Individual Time: 0805-0900 SLP Individual Time Calculation (min): 55 min  Short Term Goals: Week 1: SLP Short Term Goal 1 (Week 1): Pt will donn and doff speaking valve with supervision verbal cues.  SLP Short Term Goal 2 (Week 1): Pt will consume dys 3 textures and thin liquids with mod I use of swallowing precautions and minimal overt s/s of aspiration.  SLP Short Term Goal 3 (Week 1): Pt will consume therapeutic trials of regular textures with mod I use of swallowing precautions and no overt s/s of aspiration.  SLP Short Term Goal 4 (Week 1): Pt will coordinate onset of phonation with respirations to sustain voicing at the sentence level.   Skilled Therapeutic Interventions:Skilled ST services focused on speech and swallow skills. SLP facilitated PO consumption of breakfast tray dys 3 and thin via cup, pt positioned at 60 degress in bed, SLP attempted to seat pt at high degree with pillow, however unable to achieve higher degree. Pt demonstrated ability to self-feed and min throat clear despite 60 degree angle.  Pt demonstrated ability to don and off/on PMSV with and without visual feedback, mirror with supervision A verbal cues, pt' O2 rate remained 95 with supplemental O2 and heart rate increased to 110, however reduced with theraptic breathing exercises. Pt stated he is comfortable with PMSV during all waking hours and eager to begin capping trials. Pt had dried mucus plug removed 6/28.2019, however no noted issued noted since then, suggesting positive tolerance for capping trials in future. Pt was left in room with call bell within reach and bed alaram set.ST reccomends to continuedd skilled ST services. SLP notified nursing staff to have pt up for meals if coordinated with therapy times, due to limited tolerance in seated position.      Function:  Eating Eating   Modified Consistency Diet: Yes Eating Assist Level: Set up assist for   Eating Set Up Assist For: Opening containers       Cognition Comprehension Comprehension assist level: Follows basic conversation/direction with no assist  Expression Expression assistive device: Talk trach valve Expression assist level: Expresses basic 90% of the time/requires cueing < 10% of the time.  Social Interaction Social Interaction assist level: Interacts appropriately 90% of the time - Needs monitoring or encouragement for participation or interaction.  Problem Solving    Memory Memory assist level: Recognizes or recalls 90% of the time/requires cueing < 10% of the time    Pain Pain Assessment Pain Scale: 0-10 Pain Score: 0-No pain Pain Type: Acute pain Pain Location: Sacrum Pain Orientation: Posterior Pain Intervention(s): Medication (See eMAR)  Therapy/Group: Individual Therapy  Hellena Pridgen 07/20/2017, 10:14 AM

## 2017-07-20 NOTE — Progress Notes (Addendum)
[facility]     PROGRESS NOTE    Subjective/Complaints: Pt seen lying in bed this AM.  He slept well overnight.  He is a little upset because he has not received his breakfast yet.   ROS: Denies CP, SOB, N/V/D  Objective:  No results found. No results for input(s): WBC, HGB, HCT, PLT in the last 72 hours. No results for input(s): NA, K, CL, GLUCOSE, BUN, CREATININE, CALCIUM in the last 72 hours.  Invalid input(s): CO CBG (last 3)  Recent Labs    07/19/17 1635 07/19/17 2146 07/20/17 0644  GLUCAP 95 115* 94    Wt Readings from Last 3 Encounters:  07/20/17 (!) 182 kg (401 lb 3.8 oz)  07/15/17 (!) 172.4 kg (380 lb)  01/20/17 (!) 166 kg (366 lb)    Intake/Output Summary (Last 24 hours) at 07/20/2017 1020 Last data filed at 07/20/2017 0601 Gross per 24 hour  Intake 120 ml  Output 700 ml  Net -580 ml    Vital Signs: Blood pressure 120/75, pulse 84, temperature 98.3 F (36.8 C), resp. rate 18, height 5\' 6"  (1.676 m), weight (!) 182 kg (401 lb 3.8 oz), SpO2 96 %. Physical Exam:  Constitutional: No distress . Vital signs reviewed. Obese. HENT: Normocephalic.  Atraumatic. +trach. Eyes: EOMI. No discharge. Cardiovascular: RRR. No JVD. Respiratory: CTA Bilaterally. Normal effort. GI: BS +. Non-distended. +PEG. Musc: lower extremity edema. Neurological: He isalert Motor: 4/5 proximal to distal throughout (unchanged) Skin:Sacral decub not examined today Psychiatric: Normal motor. Normal behavior.  Assessment/Plan: 1. Functional deficits secondary to debility after VDRF which require 3+ hours per day of interdisciplinary therapy in a comprehensive inpatient rehab setting. Physiatrist is providing close team supervision and 24 hour management of active medical problems listed below. Physiatrist and rehab team continue to assess barriers to discharge/monitor patient progress toward functional and medical goals.  Function:  Bathing Bathing  position   Position: Other (comment)(sitting on BSC)  Bathing parts Body parts bathed by patient: Chest, Right arm, Left arm Body parts bathed by helper: Front perineal area, Right upper leg, Left upper leg, Right lower leg, Left lower leg, Back  Bathing assist Assist Level: (patient completed 20%, total assist)      Upper Body Dressing/Undressing Upper body dressing   What is the patient wearing?: Pull over shirt/dress     Pull over shirt/dress - Perfomed by patient: Thread/unthread right sleeve, Thread/unthread left sleeve, Put head through opening, Pull shirt over trunk          Upper body assist Assist Level: Touching or steadying assistance(Pt > 75%)      Lower Body Dressing/Undressing Lower body dressing   What is the patient wearing?: Pants, Non-skid slipper socks     Pants- Performed by patient: Thread/unthread right pants leg, Thread/unthread left pants leg, Pull pants up/down     Non-skid slipper socks- Performed by helper: Don/doff right sock, Don/doff left sock                  Lower body assist Assist for lower body dressing: Touching or steadying assistance (Pt > 75%)      Toileting Toileting Toileting activity did not occur: N/A Toileting steps completed by patient: Adjust clothing prior to toileting, Adjust clothing after toileting Toileting steps completed by helper: Performs perineal hygiene Toileting Assistive Devices: Toilet aid  Toileting assist Assist level: Touching or steadying assistance (Pt.75%)   Transfers Chair/bed transfer   Chair/bed transfer method: Stand pivot Chair/bed transfer  assist level: Touching or steadying assistance (Pt > 75%) Chair/bed transfer assistive device: Armrests, Medical sales representative     Max distance: 20 Assist level: Touching or steadying assistance (Pt > 75%)   Wheelchair          Cognition Comprehension Comprehension assist level: Follows basic conversation/direction with no assist   Expression Expression assist level: Expresses basic 90% of the time/requires cueing < 10% of the time.  Social Interaction Social Interaction assist level: Interacts appropriately 90% of the time - Needs monitoring or encouragement for participation or interaction.  Problem Solving Problem solving assist level: Solves basic 90% of the time/requires cueing < 10% of the time  Memory Memory assist level: Recognizes or recalls 90% of the time/requires cueing < 10% of the time   Medical Problem List and Plan: 1.Debilitysecondary to VDRF/multi-medicalissues  Cont CIR 2. DVT Prophylaxis/Anticoagulation: Subcutaneous Lovenox. Monitor for any bleeding episodes 3. Pain Management:Tylenol as needed 4. Mood:Provide emotional support 5. Neuropsych: This patientiscapable of making decisions on hisown behalf. 6. Skin/Wound Care/sacrococcyx decubitus:area clean  continue packing of sacral wound, local care, air mattress  appreciate WOC RN follow up  Doxycycline completed on 7/1 7. Fluids/Electrolytes/Nutrition:good po intake so far  BMP within acceptable range on 6/28  D3 thins, advance diet as tolerated 8.Tracheostomy 06/17/2017 per Dr. Cleaster Corin to cuffless XLT #6 07/09/2017.    Speech therapy follow-up   -continue PMV.  -NO PLAN FOR DECANNULATION UNTIL WEIGHT LOSS 9.Gastrostomy tube 07/05/2017. Diet advanced to mechanical soft thin liquids. 10.Diabetes mellitus. Hemoglobin A1c 5.4. Lantus insulin 10 units nightly.   Relatively controlled on 7/2 11.Morbid obesity. BMI 51.81. Follow-up dietary  Continue to educate 12.Acute on chronic anemia.   Hb 8.7 on 6/28 13.Hypertension. Demadex 10 mg daily.  Relatively controlled on 7/2 14. Morbid obesity  Encourage weight loss 15. HLD  Lipitor started on 7/1  LOS (Days) 5 A FACE TO FACE EVALUATION WAS PERFORMED  Timothy Jares Lorie Phenix, MD 07/20/2017 10:20 AM

## 2017-07-20 NOTE — Progress Notes (Signed)
Physical Therapy Session Note  Patient Details  Name: Timothy Le MRN: [id number] Date of Birth: 09/24/1960  Today's Date: 07/20/2017 PT Individual Time: 0900-0930 PT Individual Time Calculation (min): 30 min   Short Term Goals: Week 1:  PT Short Term Goal 1 (Week 1): = LTG  Skilled Therapeutic Interventions/Progress Updates: Pt received in bed, denies pain and agreeable to treatment. Maintained on 28% FiO2 via trach collar throughout session with stable vitals. Supine>sit with minA, HOB elevated and bedrails. Stand pivot transfer min guard with RW to [facility]. Requires several minutes seated on toilet to void. Pt self-selecting to perform BUE theraband exercises while seated on toilet including bicep curls/tricep extensions x10-15 reps each. Pants donned maxA with therapist threading BLEs, pt pulling pants up. Gait to recliner with RW and min guard. Remained seated in recliner at end of session, all needs in reach.      Therapy Documentation Precautions:  Precautions Precautions: Fall Precaution Comments: trach Restrictions Weight Bearing Restrictions: No Vital Signs: Therapy Vitals Pulse Rate: 84 Resp: 18 Patient Position (if appropriate): Lying Oxygen Therapy SpO2: 96 % O2 Device: Tracheostomy Collar FiO2 (%): 28 %   See Function Navigator for Current Functional Status.   Therapy/Group: Individual Therapy  Luberta Mutter 07/20/2017, 10:05 AM

## 2017-07-20 NOTE — Progress Notes (Signed)
RT call to patients room for respiratory distress.  Patient satting 100 on 28% ATC with increased respirations.  RT suctioned patient and retrieved a small mucus plug.  Patient stable and states he feels better.  RN at bedside.  RT will continue to monitor.

## 2017-07-20 NOTE — Progress Notes (Signed)
Physical Therapy Session Note  Patient Details  Name: Timothy Le MRN: [id number] Date of Birth: [birth year]  Today's Date: 07/20/2017 PT Individual Time: 6060-0459 PT Individual Time Calculation (min): 30 min   Short Term Goals: Week 1:  PT Short Term Goal 1 (Week 1): = LTG  Skilled Therapeutic Interventions/Progress Updates:    Pt supine in bed upon PT arrival, agreeable to therapy tx and reports pain in sacral area 9/10. Pt seen for make-up time.Pt agreeable to perform bed level exercises, declines OOB activity. Pt also requesting to have dressing changes, therapist let RN know. Pt transferred from supine>left sidelying with supervision and use of bedrail. Pt performed x 10 clamshells and x 10 straight leg hip abduction for strengthening with each LE. After performing exercises pt becomes tearful and states "I feel like no one is taking care of me today." Therapist providing emotional support and discussing pt's complaints. Pt states that he thought his dressing was supposed to be changed at 0800 according to his schedule and this has not been done yet today. Therapist followed up with RN about getting dressings changed. Pt left sidelying in bed, therapist discussed importance of transferred to sidelying position throughout the day for adequate pressure relief.   Therapy Documentation Precautions:  Precautions Precautions: Fall Precaution Comments: trach Restrictions Weight Bearing Restrictions: No   See Function Navigator for Current Functional Status.   Therapy/Group: Individual Therapy  Netta Corrigan, PT, DPT 07/20/2017, 4:13 PM

## 2017-07-20 NOTE — Progress Notes (Addendum)
Physical Therapy Session Note  Patient Details  Name: Timothy Le MRN: [id number] Date of Birth: [birth year]  Today's Date: 07/20/2017   Short Term Goals: Week 1:  PT Short Term Goal 1 (Week 1): = LTG  Skilled Therapeutic Interventions/Progress Updates:  Pt received in bed with eyes closed majority of time. Pt with c/o pain in buttocks wound and therapist offered to assist with repositioning but pt declined. Pt reports he's received pain medication and is willing to attempt BLE exercises in bed. Pt attempted heel slides but reported increased pain and declined participation in session. Pt missed 45 minutes of skilled PT 2/2 pain, RN aware of pt's c/o pain and notes pt is premedicated.  Therapy Documentation Precautions:  Precautions Precautions: Fall Precaution Comments: trach Restrictions Weight Bearing Restrictions: No  General: PT Amount of Missed Time (min): 45 Minutes PT Missed Treatment Reason: Pain   See Function Navigator for Current Functional Status.   Therapy/Group: Individual Therapy  [name] 07/20/2017, 3:40 PM

## 2017-07-20 NOTE — Progress Notes (Signed)
Occupational Therapy Session Note  Patient Details  Name: Timothy Le MRN: [id number] Date of Birth: 27-Jul-1960  Today's Date: 07/20/2017 OT Individual Time: 1000-1100 OT Individual Time Calculation (min): 60 min    Short Term Goals: Week 1:  OT Short Term Goal 1 (Week 1): Pt will complete bathing with Mod A and AE as needed OT Short Term Goal 2 (Week 1): Pt will complete 1/3 components of donning pants with AE as needed OT Short Term Goal 3 (Week 1): Pt will complete 1/4 components of donning overhead shirt  Skilled Therapeutic Interventions/Progress Updates:    Pt seen for OT session focusing on ADL re-training, functional activity tolerance, and functional standing balance/endurance. Pt sitting up in recliner upon arrival, sister present and pt agreeable to tx session. Pt reports this AM signing papers for new entry level apartment, hopes to be allowed to move into new apartment within 2 weeks, CSW made aware in prep for team conference and d/c planning purposes.  He ambulated ~82ft from relciner to w/c with guarding assist using RW. Completed grooming tasks from w/c level at sink. Pt initially apprehensive regading self shaving due to "shakiness" in B hands, however, demonstrated ability to shave face in entireity safely using standard razor. He then completed standing trials at [facility]. First trial pt tolerating 70 seconds of static standing with B UE support. Following seated rest break, pt stood and able to pull down pants in prep for toileting task with CGA. Pt returned to sitting in order to attempt to use urinal.  Pt requesting to stay sitting in w/c for more privacy and time in attempt to use urinal. Pt eft seated in w/c with call bell in reach, NT made aware of pt's position and need for assist following void.  During session, discussed at length d/c planning. Pt very anxious regarding a targeted upcoming d/c of next week due to medical stability. Reinforced that pt's medical team will  feel he is medically stable prior to d/c. Pt also anxious regarding going home with trach. Will cont to provide education and encouragement as well as follow up with CSW and medical team to address d/c concerns.   Therapy Documentation Precautions:  Precautions Precautions: Fall Precaution Comments: trach Restrictions Weight Bearing Restrictions: No ADL: ADL ADL Comments: Please see functional navigator for ADL status  See Function Navigator for Current Functional Status.   Therapy/Group: Individual Therapy  Ameriah Lint L 07/20/2017, 6:51 AM

## 2017-07-20 NOTE — Discharge Instructions (Signed)
Inpatient Rehab Discharge Instructions  Timothy Le Discharge date and time: No discharge date for patient encounter.   Activities/Precautions/ Functional Status: Activity: activity as tolerated Diet: soft Wound Care: keep wound clean and dry Functional status:  ___ No restrictions     ___ Walk up steps independently ___ 24/7 supervision/assistance   ___ Walk up steps with assistance ___ Intermittent supervision/assistance  ___ Bathe/dress independently ___ Walk with walker     _x__ Bathe/dress with assistance ___ Walk Independently    ___ Shower independently ___ Walk with assistance    ___ Shower with assistance ___ No alcohol     ___ Return to work/school ________  Special Instructions:  Routine trach care  My questions have been answered and I understand these instructions. I will adhere to these goals and the provided educational materials after my discharge from the hospital.  Patient/Caregiver Signature _______________________________ Date __________  Clinician Signature _______________________________________ Date __________  Please bring this form and your medication list with you to all your follow-up doctor's appointments.

## 2017-07-21 ENCOUNTER — Inpatient Hospital Stay (HOSPITAL_COMMUNITY): Payer: Medicaid Other

## 2017-07-21 ENCOUNTER — Inpatient Hospital Stay (HOSPITAL_COMMUNITY): Payer: Medicaid Other | Admitting: Speech Pathology

## 2017-07-21 ENCOUNTER — Inpatient Hospital Stay (HOSPITAL_COMMUNITY): Payer: Medicaid Other | Admitting: Occupational Therapy

## 2017-07-21 LAB — GLUCOSE, CAPILLARY
GLUCOSE-CAPILLARY: 111 mg/dL — AB (ref 70–99)
GLUCOSE-CAPILLARY: 108 mg/dL — AB (ref 70–99)
Glucose-Capillary: 97 mg/dL (ref 70–99)
Glucose-Capillary: 95 mg/dL (ref 70–99)

## 2017-07-21 MED ORDER — ADULT MULTIVITAMIN W/MINERALS CH
1.0000 | ORAL_TABLET | Freq: Every day | ORAL | Status: DC
  Administered 2017-07-22 – 2017-07-29 (×8): 1 via ORAL
  Filled 2017-07-21 (×8): qty 1

## 2017-07-21 MED ORDER — POTASSIUM CHLORIDE CRYS ER 20 MEQ PO TBCR
20.0000 meq | EXTENDED_RELEASE_TABLET | Freq: Every day | ORAL | Status: DC
  Administered 2017-07-22 – 2017-07-29 (×8): 20 meq via ORAL
  Filled 2017-07-21 (×8): qty 1

## 2017-07-21 MED ORDER — DOCUSATE SODIUM 100 MG PO CAPS
100.0000 mg | ORAL_CAPSULE | Freq: Two times a day (BID) | ORAL | Status: DC
  Administered 2017-07-21 – 2017-07-29 (×16): 100 mg via ORAL
  Filled 2017-07-21 (×16): qty 1

## 2017-07-21 NOTE — Progress Notes (Signed)
Patient seen for trach team follow up.  All needed equipment at the bedside.  Trach education started today.  Patient to be discharged sometime next week.  Trach management booklet given to patient.  Attempted to start trach care education, but patient was too anxious.  Patient was unwilling to attempt any care at this point.  Encouraged patient to start practicing trach care and suctioning when staff is performing the tasks.  Patient did not seem like he will be willing to do this.  RN and CSW notified of the visit.  Told patient a member of the trach team will be back on Friday to follow up.  Will continue to follow for progression.

## 2017-07-21 NOTE — Progress Notes (Signed)
[facility] PHYSICAL MEDICINE & REHABILITATION     PROGRESS NOTE    Subjective/Complaints: Pt seen lying in bed this AM.  He states he slept well for the most part, but had some difficult to breathing overnight because her food particle was stuck in his throat that had to be suctioned by respiratory. Educated patient on swallowing precautions.  ROS: Denies CP, SOB, N/V/D  Objective:  No results found. No results for input(s): WBC, HGB, HCT, PLT in the last 72 hours. No results for input(s): NA, K, CL, GLUCOSE, BUN, CREATININE, CALCIUM in the last 72 hours.  Invalid input(s): CO CBG (last 3)  Recent Labs    07/20/17 1631 07/20/17 2121 07/21/17 0614  GLUCAP 130* 115* 97    Wt Readings from Last 3 Encounters:  07/21/17 (!) 197.3 kg (434 lb 15.5 oz)  07/15/17 (!) 172.4 kg (380 lb)  01/20/17 (!) 166 kg (366 lb)    Intake/Output Summary (Last 24 hours) at 07/21/2017 1137 Last data filed at 07/21/2017 0831 Gross per 24 hour  Intake 680 ml  Output 1450 ml  Net -770 ml    Vital Signs: Blood pressure (!) 121/53, pulse (!) 111, temperature 98.7 F (37.1 C), temperature source Oral, resp. rate 18, height 5\' 6"  (1.676 m), weight (!) 197.3 kg (434 lb 15.5 oz), SpO2 100 %. Physical Exam:  Constitutional: No distress . Vital signs reviewed. Obese. HENT: Normocephalic.  Atraumatic. +Trach. Eyes: EOMI. No discharge. Cardiovascular: RRR. No JVD. Respiratory: CTA Bilaterally. Normal effort. GI: BS +. Non-distended. +PEG. Musc: lower extremity edema. Neurological: He isalert Motor: 4/5 proximal to distal throughout (stable) Skin:Sacral decub not examined today Psychiatric: Normal motor. Normal behavior.  Assessment/Plan: 1. Functional deficits secondary to debility after VDRF which require 3+ hours per day of interdisciplinary therapy in a comprehensive inpatient rehab setting. Physiatrist is providing close team supervision and 24 hour management of active medical problems listed  below. Physiatrist and rehab team continue to assess barriers to discharge/monitor patient progress toward functional and medical goals.  Function:  Bathing Bathing position   Position: Other (comment)(sitting on BSC)  Bathing parts Body parts bathed by patient: Chest, Right arm, Left arm Body parts bathed by helper: Front perineal area, Right upper leg, Left upper leg, Right lower leg, Left lower leg, Back  Bathing assist Assist Level: (patient completed 20%, total assist)      Upper Body Dressing/Undressing Upper body dressing   What is the patient wearing?: Pull over shirt/dress     Pull over shirt/dress - Perfomed by patient: Thread/unthread right sleeve, Thread/unthread left sleeve, Put head through opening, Pull shirt over trunk          Upper body assist Assist Level: Touching or steadying assistance(Pt > 75%)      Lower Body Dressing/Undressing Lower body dressing   What is the patient wearing?: Pants, Non-skid slipper socks     Pants- Performed by patient: Thread/unthread right pants leg, Thread/unthread left pants leg, Pull pants up/down     Non-skid slipper socks- Performed by helper: Don/doff right sock, Don/doff left sock                  Lower body assist Assist for lower body dressing: Touching or steadying assistance (Pt > 75%)      Toileting Toileting Toileting activity did not occur: N/A Toileting steps completed by patient: Adjust clothing prior to toileting, Adjust clothing after toileting, Performs perineal hygiene Toileting steps completed by helper: Adjust clothing prior to toileting, Performs  perineal hygiene, Adjust clothing after toileting Toileting Assistive Devices: Toilet aid  Toileting assist Assist level: Set up/obtain supplies   Transfers Chair/bed transfer   Chair/bed transfer method: Stand pivot Chair/bed transfer assist level: Touching or steadying assistance (Pt > 75%) Chair/bed transfer assistive device: Armrests, Environmental health practitioner     Max distance: 20 Assist level: Touching or steadying assistance (Pt > 75%)   Wheelchair          Cognition Comprehension Comprehension assist level: Follows basic conversation/direction with no assist  Expression Expression assist level: Expresses basic needs/ideas: With no assist  Social Interaction Social Interaction assist level: Interacts appropriately 90% of the time - Needs monitoring or encouragement for participation or interaction.  Problem Solving Problem solving assist level: Solves basic 90% of the time/requires cueing < 10% of the time  Memory Memory assist level: Recognizes or recalls 90% of the time/requires cueing < 10% of the time   Medical Problem List and Plan: 1.Debilitysecondary to VDRF/multi-medicalissues  Cont CIR 2. DVT Prophylaxis/Anticoagulation: Subcutaneous Lovenox. Monitor for any bleeding episodes 3. Pain Management:Tylenol as needed 4. Mood:Provide emotional support 5. Neuropsych: This patientiscapable of making decisions on hisown behalf. 6. Skin/Wound Care/sacrococcyx decubitus:area clean  continue packing of sacral wound, local care, air mattress  appreciate WOC RN follow up  Doxycycline completed on 7/1 7. 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 Cleaster Corin to cuffless XLT #6 07/09/2017.    Speech therapy follow-up   -continue PMV.  -NO PLAN FOR DECANNULATION UNTIL WEIGHT LOSS 9.Gastrostomy tube 07/05/2017. Diet advanced to mechanical soft thin liquids. 10.Diabetes mellitus. Hemoglobin A1c 5.4. Lantus insulin 10 units nightly.   Controlled on 7/3 11.Morbid obesity. BMI 51.81. Follow-up dietary  Continue to educate 12.Acute on chronic anemia.   Hb 8.7 on 6/28  Plan to order labs for Friday 13.Hypertension. Demadex 10 mg  daily.  Relatively controlled on 7/3 14. Morbid obesity  Encourage weight loss 15. HLD  Lipitor started on 7/1  LOS (Days) 6 A FACE TO FACE EVALUATION WAS PERFORMED  Timothy Le Lorie Phenix, MD 07/21/2017 11:37 AM

## 2017-07-21 NOTE — Progress Notes (Signed)
Occupational Therapy Session Note  Patient Details  Name: Timothy Le MRN: [id number] Date of Birth: 09/05/1960  Today's Date: 07/21/2017 OT Individual Time: 0833-0930 OT Individual Time Calculation (min): 57 min    Short Term Goals: Week 1:  OT Short Term Goal 1 (Week 1): Pt will complete bathing with Mod A and AE as needed OT Short Term Goal 2 (Week 1): Pt will complete 1/3 components of donning pants with AE as needed OT Short Term Goal 3 (Week 1): Pt will complete 1/4 components of donning overhead shirt      Skilled Therapeutic Interventions/Progress Updates:    Pt received in bed stating his sacral area still was painful but significantly better than yesterday. Pt sat to EOB with touching HHA and then stood with RW with S.  Cued to exhale on exertion as he was holding his breath. Pt stepped with RW to BSC with CGA.  Pt was able to have a BM and used L lateral wt shift to use R hand to cleanse. Pt was able to fully cleanse self.  He then stepped towards W/c to bathe at sink. He was nervous to work on standing to bathe bottom and perineal area but was able to do so with encouragement.  He felt the long handled sponge was not helpful so he needed A to wash lower legs and feet.  He then donned underwear without reacher as he said he could do it.  He did get his pants on but with great exertion.  Recommended pt to try a reacher next time to not over fatigue himself.  Pt then ambulated to his recliner chair in room.  Pt tolerated therapy well. Pt resting with all needs met.  Therapy Documentation Precautions:  Precautions Precautions: Fall Precaution Comments: trach Restrictions Weight Bearing Restrictions: No    Vital Signs: Therapy Vitals Pulse Rate: 82 Resp: 18 Oxygen Therapy SpO2: 100 % O2 Device: Tracheostomy Collar O2 Flow Rate (L/min): 5 L/min FiO2 (%): 28 % Pain: Pain Assessment Pain Score: 6  Pain Location: Sacrum Pain Orientation: Posterior Pain Descriptors /  Indicators: Aching Pain Onset: On-going Pain Intervention(s): Repositioned ADL: ADL ADL Comments: Please see functional navigator for ADL status   See Function Navigator for Current Functional Status.   Therapy/Group: Individual Therapy  , 07/21/2017, 9:11 AM 

## 2017-07-21 NOTE — Consult Note (Signed)
Neuropsychological Consultation   Patient:   Timothy Le   DOB:   1960/09/28  MR Number:  [id number]  Location:  [facility] [id number] [place] [address] Dept: [phone] VOH: [phone]           Date of Service:   07/20/2017  Start Time:   2 PM End Time:   3 PM  Provider/Observer:  Ilean Skill, Psy.D.       Clinical Neuropsychologist       Billing Code/Service: [phone] 4 Units  Chief Complaint:    Timothy Le is a 57 year old male with history of asthma/COPD, OSA noncompliant with CPAP, tobacco abuse, morbid obesity with BMI 51.81, diabetes and hypertension.  The patient presented on 06/09/2017 with increasing shortness of breath as well as progressive weight gain.  The patient required intubation.  The patient was in respiratory failure and ultimately underwent tracheostomy on 06/17/2017.  There is no plan at this point for removing his trach due to significant body mass.  Reason for Service:  Timothy Le is a 57 year old right-handed male with history of asthma/COPD, OSA noncompliant with CPAP, tobacco abuse, morbid obesity with BMI 51.81, diabetes mellitus and hypertension. Per chart review patient lives alone was using a walker for mobility. He has friends that will do his grocery shopping. He does have an aide to assist with some basic ADLs. One level apartment with 14 steps to entry. Family checks on him as needed. Presented 06/09/2017 with increasing shortness of breath as well as progressive weight gain. Patient did require intubation. Chest x-ray showed lung volumes with bibasilar atelectasis. Troponin 0.06, urine drug screen negative, creatinine 1.27. Critical care pulmonary services consulted for respiratory failure continue to follow difficult weaning from ventilator ultimately underwent tracheostomy 06/17/2017 recently changed to a cuffless #6 XLT  07/09/2017. Speech therapy working with PMV. No plan for decannulation until weight loss. Patient did require gastrostomy tube placement for nutritional support 07/05/2017. Diet has been advanced to a mechanical soft thin liquid diet. Subcutaneous Lovenox for DVT prophylaxis. Wound care nurse follow-up for sacral wound with dressing care as directed. Therapies have been initiated with progressive gains and recommendations of physical medicine rehab consult. Patient was admitted for a comprehensive rehab program  Current Status:  The patient was oriented and showed adequate memory and attentional abilities.  The patient talked about his desire to try to start doing better with his medical compliance and acknowledged his awareness that he needed to lose a significant amount of weight.  While the patient described being motivated he was readily available with a number of excuses for why it will be difficult for him to do more physical activity.  The patient reports that he was quite happy that he was able to walk some distance today and reported that this was the longest and further she had walked for quite some time.  The patient is motivated to continue to participate in physical and OT therapies.  Behavioral Observation: Timothy Le  presents as a 57 y.o.-year-old Right African American Male who appeared his stated age. his dress was Appropriate and he was Well Groomed and his manners were Appropriate to the situation.  his participation was indicative of Appropriate and Drowsy behaviors.  There were any physical disabilities noted.  he displayed an appropriate level of cooperation and motivation.     Interactions:    Active Appropriate and Drowsy  Attention:  normal   Memory:   within normal limits; recent and remote memory intact  Visuo-spatial:  not examined  Speech (Volume):  low  Speech:   slurred; normal  Thought Process:  Coherent and Relevant  Though Content:  WNL; not  suicidal and not homicidal  Orientation:   person, place, time/date and situation  Judgment:   Fair  Planning:   Poor  Affect:    Depressed  Mood:    Dysphoric  Insight:   Fair  Intelligence:   normal  Medical History:   Past Medical History:  Diagnosis Date  . Asthma   . Chronic respiratory failure with hypoxia ([place]) 07/09/2017  . COPD (chronic obstructive pulmonary disease) ([place])   . Diabetes mellitus without complication ([place])   . Difficult intubation   . Hypertension   . Shortness of breath dyspnea   . Sleep apnea     Family Med/Psych History:  Family History  Problem Relation Age of Onset  . Asthma Sister   . Asthma Other        nephew  . Hypertension Sister   . Diabetes type II Sister     Risk of Suicide/Violence: low the patient denies any suicidal or homicidal ideation  Impression/DX:  Timothy Le is a 57 year old male with history of asthma/COPD, OSA noncompliant with CPAP, tobacco abuse, morbid obesity with BMI 51.81, diabetes and hypertension.  The patient presented on 06/09/2017 with increasing shortness of breath as well as progressive weight gain.  The patient required intubation.  The patient was in respiratory failure and ultimately underwent tracheostomy on 06/17/2017.  There is no plan at this point for removing his trach due to significant body mass.  The patient was oriented and showed adequate memory and attentional abilities.  The patient talked about his desire to try to start doing better with his medical compliance and acknowledged his awareness that he needed to lose a significant amount of weight.  While the patient described being motivated he was readily available with a number of excuses for why it will be difficult for him to do more physical activity.  The patient reports that he was quite happy that he was able to walk some distance today and reported that this was the longest and further she had walked for quite some time.  The patient  is motivated to continue to participate in physical and OT therapies.         Electronically Signed   _______________________ Ilean Skill, Psy.D.

## 2017-07-21 NOTE — Progress Notes (Signed)
Speech Language Pathology Daily Session Note  Patient Details  Name: Timothy Le MRN: [id number] Date of Birth: 12/16/1960  Today's Date: 07/21/2017 SLP Individual Time: 1000-1030 SLP Individual Time Calculation (min): 30 min  Short Term Goals: Week 1: SLP Short Term Goal 1 (Week 1): Pt will donn and doff speaking valve with supervision verbal cues.  SLP Short Term Goal 2 (Week 1): Pt will consume dys 3 textures and thin liquids with mod I use of swallowing precautions and minimal overt s/s of aspiration.  SLP Short Term Goal 3 (Week 1): Pt will consume therapeutic trials of regular textures with mod I use of swallowing precautions and no overt s/s of aspiration.  SLP Short Term Goal 4 (Week 1): Pt will coordinate onset of phonation with respirations to sustain voicing at the sentence level.   Skilled Therapeutic Interventions: Skilled treatment session focused on communication goals. Upon arrival, patient's PMSV was donned. Patient was 100% intelligible at the conversation level with adequate vocal intensity and coordination of phonation while discussing discharge planning with this SLP and CSW. Patient reported he could independently remove his PMSV, however, patient twisted the PMSV instead of pulling it off which caused his inner cannula to become loose which sent patient into a large coughing episode that required suctioning, however, all vitals remained WFL. Patient handed off to PT. Continue with current plan of care.      Function:  Eating Eating   Modified Consistency Diet: Yes Eating Assist Level: Set up assist for   Eating Set Up Assist For: Opening containers       Cognition Comprehension Comprehension assist level: Follows basic conversation/direction with no assist  Expression Expression assistive device: Talk trach valve Expression assist level: Expresses basic needs/ideas: With no assist  Social Interaction Social Interaction assist level: Interacts appropriately  90% of the time - Needs monitoring or encouragement for participation or interaction.  Problem Solving Problem solving assist level: Solves basic 90% of the time/requires cueing < 10% of the time  Memory Memory assist level: Recognizes or recalls 90% of the time/requires cueing < 10% of the time    Pain Pain Assessment Pain Scale: 0-10 Pain Score: 5  Pain Type: Acute pain Pain Location: Sacrum Pain Orientation: Posterior Pain Descriptors / Indicators: Aching;Dull;Discomfort Pain Frequency: Constant Pain Onset: On-going Pain Intervention(s): Medication (See eMAR)  Therapy/Group: Individual Therapy  Judieth Mckown 07/21/2017, 1:10 PM

## 2017-07-21 NOTE — Progress Notes (Signed)
Physical Therapy Session Note  Patient Details  Name: Timothy Le MRN: [id number] Date of Birth: October 13, 1960  Today's Date: 07/21/2017 tx 1:  PT Individual Time: 1030-1135 PT Individual Time Calculation (min): 65 min   Short Term Goals: Week 1:  PT Short Term Goal 1 (Week 1): = LTG     Skilled Therapeutic Interventions/Progress Updates:   O2 sats stayed above 95% during session. PMV in place.  See vitals.   Pt seated in bariatric recliner, upright.  Seated Therapeutic exercises performed with LE to increase strength for functional mobility.: 2 x 10 bil hip adduction against 2 pillow, bil hip abduction against blue Theraband, 5 x 2 R/L long arc quad knee ext, heel raises, toe raises, marching; 1 x 10 bil glut sets. VCs for slower, focused movement.  Stand pivot with RW to w/c with min guard assist.  PT adjusted L leg rest to position shorter, to = R leg rest position, for reduced sacral wt bearing.  Standing dynamic activity using R hand to match playing cards to board in front of him; no UE support, x 30 seconds x 3, x 45 seconds x 1.  Pt limited by fatigue and wound pain.   Stand pivot to return to recliner in room.  Pt left resting with needs in place.  Santiago Glad, RN in room to begin dsg change.  tx 2:  1515-1600, 45 min   O2 on 5L via trach collar, 28% FiO2. PMV in place.   O2 sats dropped briefly to 85% when pt first moved into partial R sidelying; PT raised bed briefly as sats increased to 94%, then lowered again to 30 degrees HOB; sats stayed above 94%   Pt sitting up at 45 degrees in bed, c/o pain sacrum.   PT educated pt on bed positioning.  PT lowered HOB to 30 degrees, flattened knees; pt able to roll to L/R  partially , and stay in that position comfortablly with pillow between kinees, and rolled up flannel sheet to prevent rolling back into supine. Pt stated pain was decreased immediately.  R/L clam shells in partial side lying to activate hip abductors, 10 x 2.   Educated pt on positioning for reducing pain and optimizing blood flow to wound.    Gait training in room from bed> bariatric chair with RW, close supervision.   Pt left resting in chair with needs at hand, towel roll under edge of Roho cushion to facilitate lean to L to stay off of wound.  PT instructed pt to ask for help moving towel roll after 20-30 minutes.  PT informed Santiago Glad, RN about pt's positioning in bed and chair to facilitate wound healing and reduce pain.     Therapy Documentation Precautions:  Precautions Precautions: Fall Precaution Comments: trach Restrictions Weight Bearing Restrictions: No  Pain: AM tx: " a lot!" sacral wound; premedicated.  Wound dressing will be changed at lunch time, per pt. PM tx: "real bad" on sacrum; pt requested pain meds at end of session.    See Function Navigator for Current Functional Status.   Therapy/Group: Individual Therapy  Erich Kochan 07/21/2017, 4:24 PM

## 2017-07-22 ENCOUNTER — Inpatient Hospital Stay (HOSPITAL_COMMUNITY): Payer: Medicaid Other | Admitting: Physical Therapy

## 2017-07-22 ENCOUNTER — Inpatient Hospital Stay (HOSPITAL_COMMUNITY): Payer: Medicaid Other

## 2017-07-22 ENCOUNTER — Inpatient Hospital Stay (HOSPITAL_COMMUNITY): Payer: Medicaid Other | Admitting: Occupational Therapy

## 2017-07-22 ENCOUNTER — Inpatient Hospital Stay (HOSPITAL_COMMUNITY): Payer: Medicaid Other | Admitting: Speech Pathology

## 2017-07-22 DIAGNOSIS — R740 Nonspecific elevation of levels of transaminase and lactic acid dehydrogenase [LDH]: Secondary | ICD-10-CM

## 2017-07-22 DIAGNOSIS — R7401 Elevation of levels of liver transaminase levels: Secondary | ICD-10-CM

## 2017-07-22 LAB — GLUCOSE, CAPILLARY
GLUCOSE-CAPILLARY: 90 mg/dL (ref 70–99)
GLUCOSE-CAPILLARY: 92 mg/dL (ref 70–99)
Glucose-Capillary: 90 mg/dL (ref 70–99)
Glucose-Capillary: 105 mg/dL — ABNORMAL HIGH (ref 70–99)

## 2017-07-22 NOTE — Progress Notes (Signed)
[facility]ILITATION     PROGRESS NOTE    Subjective/Complaints: Patient seen lying in bed this morning. He states he slept well overnight. He states he slept like a baby.  ROS: Denies CP, SOB, N/V/D  Objective:  No results found. No results for input(s): WBC, HGB, HCT, PLT in the last 72 hours. No results for input(s): NA, K, CL, GLUCOSE, BUN, CREATININE, CALCIUM in the last 72 hours.  Invalid input(s): CO CBG (last 3)  Recent Labs    07/21/17 1646 07/21/17 2149 07/22/17 0638  GLUCAP 111* 108* 90    Wt Readings from Last 3 Encounters:  07/22/17 (!) 197.3 kg (434 lb 15.5 oz)  07/15/17 (!) 172.4 kg (380 lb)  01/20/17 (!) 166 kg (366 lb)    Intake/Output Summary (Last 24 hours) at 07/22/2017 0901 Last data filed at 07/22/2017 0730 Gross per 24 hour  Intake 600 ml  Output 650 ml  Net -50 ml    Vital Signs: Blood pressure (!) 121/105, pulse 74, temperature 98.8 F (37.1 C), temperature source Oral, resp. rate 18, height 5\' 6"  (1.676 m), weight (!) 197.3 kg (434 lb 15.5 oz), SpO2 100 %. Physical Exam:  Constitutional: No distress . Vital signs reviewed. Obese. HENT: Normocephalic.  Atraumatic. +Trach. Eyes: EOMI. No discharge. Cardiovascular: RRR. No JVD. Respiratory: CTA bilaterally. Normal effort. GI: BS +. Non-distended. +PEG. Musc: lower extremity edema. Neurological: He isalert Motor: 4/5 proximal to distal throughout (unchanged) Skin:Sacral decub not examined today Psychiatric: Normal motor. Normal behavior.  Assessment/Plan: 1. Functional deficits secondary to debility after VDRF which require 3+ hours per day of interdisciplinary therapy in a comprehensive inpatient rehab setting. Physiatrist is providing close team supervision and 24 hour management of active medical problems listed below. Physiatrist and rehab team continue to assess barriers to discharge/monitor patient progress toward functional and medical  goals.  Function:  Bathing Bathing position   Position: Wheelchair/chair at sink  Bathing parts Body parts bathed by patient: Right arm, Left arm, Chest, Abdomen, Front perineal area, Buttocks, Right upper leg, Left upper leg Body parts bathed by helper: Right lower leg, Left lower leg, Back  Bathing assist Assist Level: (patient completed 20%, total assist)      Upper Body Dressing/Undressing Upper body dressing   What is the patient wearing?: Hospital gown     Pull over shirt/dress - Perfomed by patient: Thread/unthread right sleeve, Thread/unthread left sleeve, Put head through opening, Pull shirt over trunk          Upper body assist Assist Level: Touching or steadying assistance(Pt > 75%)      Lower Body Dressing/Undressing Lower body dressing   What is the patient wearing?: Underwear, Non-skid slipper socks Underwear - Performed by patient: Thread/unthread right underwear leg, Thread/unthread left underwear leg, Pull underwear up/down   Pants- Performed by patient: Thread/unthread right pants leg, Thread/unthread left pants leg, Pull pants up/down     Non-skid slipper socks- Performed by helper: Don/doff right sock, Don/doff left sock                  Lower body assist Assist for lower body dressing: Touching or steadying assistance (Pt > 75%)      Toileting Toileting Toileting activity did not occur: N/A Toileting steps completed by patient: Adjust clothing prior to toileting, Adjust clothing after toileting, Performs perineal hygiene Toileting steps completed by helper: Adjust clothing prior to toileting, Performs perineal hygiene, Adjust clothing after toileting Toileting Assistive Devices: Toilet aid  Toileting assist Assist level: Set up/obtain supplies   Transfers Chair/bed transfer   Chair/bed transfer method: Ambulatory Chair/bed transfer assist level: Supervision or verbal cues Chair/bed transfer assistive device: Armrests, Environmental health practitioner     Max distance: 15 Assist level: Supervision or verbal cues   Wheelchair          Cognition Comprehension Comprehension assist level: Follows basic conversation/direction with no assist  Expression Expression assist level: Expresses basic needs/ideas: With no assist  Social Interaction Social Interaction assist level: Interacts appropriately 90% of the time - Needs monitoring or encouragement for participation or interaction.  Problem Solving Problem solving assist level: Solves basic 90% of the time/requires cueing < 10% of the time  Memory Memory assist level: Recognizes or recalls 90% of the time/requires cueing < 10% of the time   Medical Problem List and Plan: 1.Debilitysecondary to VDRF/multi-medicalissues  Cont CIR 2. DVT Prophylaxis/Anticoagulation: Subcutaneous Lovenox. Monitor for any bleeding episodes 3. Pain Management:Tylenol as needed 4. Mood:Provide emotional support 5. Neuropsych: This patientiscapable of making decisions on hisown behalf. 6. Skin/Wound Care/sacrococcyx decubitus:area clean  continue packing of sacral wound, local care, air mattress  appreciate WOC RN follow up  Doxycycline completed on 7/1 7. 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 Cleaster Corin to cuffless XLT #6 07/09/2017.    Speech therapy follow-up   -continue PMV.  -NO PLAN FOR DECANNULATION UNTIL WEIGHT LOSS 9.Gastrostomy tube 07/05/2017. Diet advanced to mechanical soft thin liquids. 10.Diabetes mellitus. Hemoglobin A1c 5.4. Lantus insulin 10 units nightly.   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 Follow-up dietary  Continue to educate 12.Acute on chronic anemia.   Hb 8.7 on 6/28  Labs ordered for tomorrow 13.Hypertension. Demadex 10 mg daily.  Relatively controlled  on 7/3 14. HLD  Lipitor started on 7/1 15. Transaminitis  ALT elevated on 6/28  Labs ordered for tomorrow  LOS (Days) 7 A FACE TO FACE EVALUATION WAS PERFORMED  Ankit Lorie Phenix, MD 07/22/2017 9:01 AM

## 2017-07-22 NOTE — Plan of Care (Signed)
  Problem: Consults Goal: RH GENERAL PATIENT EDUCATION Description See Patient Education module for education specifics. Outcome: Progressing Goal: Skin Care Protocol Initiated - if Braden Score 18 or less Description If consults are not indicated, leave blank or document N/A Outcome: Progressing Goal: Nutrition Consult-if indicated Outcome: Progressing Goal: Diabetes Guidelines if Diabetic/Glucose > 140 Description If diabetic or lab glucose is > 140 mg/dl - Initiate Diabetes/Hyperglycemia Guidelines & Document Interventions  Outcome: Progressing   Problem: RH BOWEL ELIMINATION Goal: RH STG MANAGE BOWEL WITH ASSISTANCE Description STG Manage Bowel with min Assistance.  Outcome: Progressing Goal: RH STG MANAGE BOWEL W/MEDICATION W/ASSISTANCE Description STG Manage Bowel with Medication with min Assistance.  Outcome: Progressing   Problem: RH BLADDER ELIMINATION Goal: RH STG MANAGE BLADDER WITH ASSISTANCE Description STG Manage Bladder With min Assistance  Outcome: Progressing Goal: RH STG MANAGE BLADDER WITH MEDICATION WITH ASSISTANCE Description STG Manage Bladder With Medication With min Assistance.  Outcome: Progressing   Problem: RH SKIN INTEGRITY Goal: RH STG SKIN FREE OF INFECTION/BREAKDOWN Description Healing wound and no new skin breakdown with mod assistance  Outcome: Progressing Goal: RH STG MAINTAIN SKIN INTEGRITY WITH ASSISTANCE Description STG Maintain Skin Integrity With mod Assistance.  Outcome: Progressing Goal: RH STG ABLE TO PERFORM INCISION/WOUND CARE W/ASSISTANCE Description STG Able To Perform Incision/Wound Care With max Assistance.  Outcome: Progressing   Problem: RH SAFETY Goal: RH STG ADHERE TO SAFETY PRECAUTIONS W/ASSISTANCE/DEVICE Description STG Adhere to Safety Precautions With min Assistance/Device.  Outcome: Progressing Goal: RH STG DECREASED RISK OF FALL WITH ASSISTANCE Description STG Decreased Risk of Fall With min Assistance.   Outcome: Progressing Goal: RH STG DEMO UNDERSTANDING HOME SAFETY PRECAUTIONS Description Patient able to verbalize home safety precaution with min assistance  Outcome: Progressing   Problem: RH PAIN MANAGEMENT Goal: RH STG PAIN MANAGED AT OR BELOW PT'S PAIN GOAL Description Pain < or = 5 with min assistance  Outcome: Progressing   Problem: RH KNOWLEDGE DEFICIT GENERAL Goal: RH STG INCREASE KNOWLEDGE OF SELF CARE AFTER HOSPITALIZATION Description Patient able to verbalize self care with min assistance  Outcome: Progressing

## 2017-07-22 NOTE — Progress Notes (Signed)
Speech Language Pathology Daily Session Note  Patient Details  Name: Timothy Le MRN: [id number] Date of Birth: February 23, 1960  Today's Date: 07/22/2017 SLP Individual Time: 1100-1155 SLP Individual Time Calculation (min): 55 min  Short Term Goals: Week 1: SLP Short Term Goal 1 (Week 1): Pt will donn and doff speaking valve with supervision verbal cues.  SLP Short Term Goal 2 (Week 1): Pt will consume dys 3 textures and thin liquids with mod I use of swallowing precautions and minimal overt s/s of aspiration.  SLP Short Term Goal 3 (Week 1): Pt will consume therapeutic trials of regular textures with mod I use of swallowing precautions and no overt s/s of aspiration.  SLP Short Term Goal 4 (Week 1): Pt will coordinate onset of phonation with respirations to sustain voicing at the sentence level.   Skilled Therapeutic Interventions: Skilled treatment session focused on communication and dysphagia goals. SLP facilitated session by providing supervision verbal cues for patient to donn/doff PMSV safely. Patient removed his PMSV today without coughing, suspect due to pulling the valve straight off instead of twisting it off. Patient also consumed his lunch meal of Dys. 3 textures with thin liquids via cup without overt s/s of aspiration and was Mod I for use of swallowing compensatory strategies. Patient was 100% intelligible at the conversation level with Mod I. Patient left upright in recliner with all needs within reach. Continue with current plan of care.      Function:  Eating Eating   Modified Consistency Diet: Yes Eating Assist Level: Swallowing techniques: self managed           Cognition Comprehension Comprehension assist level: Follows basic conversation/direction with no assist  Expression   Expression assist level: Expresses basic needs/ideas: With no assist  Social Interaction Social Interaction assist level: Interacts appropriately 90% of the time - Needs monitoring or  encouragement for participation or interaction.  Problem Solving Problem solving assist level: Solves basic 90% of the time/requires cueing < 10% of the time  Memory Memory assist level: Recognizes or recalls 90% of the time/requires cueing < 10% of the time    Pain Pain Assessment Pain Scale: 0-10 Pain Score: 7  Pain Type: Acute pain Pain Location: Buttocks Pain Orientation: Posterior Pain Descriptors / Indicators: Throbbing Pain Frequency: Constant Pain Onset: Progressive Patients Stated Pain Goal: 6 Pain Intervention(s): Medication (See eMAR);Repositioned  Therapy/Group: Individual Therapy  Madine Sarr 07/22/2017, 2:59 PM

## 2017-07-22 NOTE — Progress Notes (Signed)
Social Work Patient ID: Timothy Le, male   DOB: [birth year], 57 y.o.   MRN: [id number]  Have reviewed team conference with pt and with sister, [name] (via phone).  Both aware of targeted d/c date next week, however, both very concerned that they will not be ready for this due to several issues.      Family has still not secured a first level apt and they are continuing to talk with other complexes in the area, however, he is in [address] housing and they do not believe that they will have anything secured by next week.    Care needs for pt at home are a major concern.  Family can only provide intermittent support.  Explained to pt and sister that they will need to identify at least one family member or friend who could be primary caregiver who can assist with wound care and be trained in trach care.  Sister reports that they have a family member "[name]" who is a retired nursed.  Stressed that this person will need to anticipate at least two visits daily to pt's home and sister unsure if this commitment can be made.      Explained to pt that he must learn trach care himself and all emergency procedures that are included.  RT addressed this with him as well.  Pt admits he is "very scared" about being at home alone with the trach.  Very reluctant to doing any trach care himself.  Have informed him that, unless he has 24/7 caregivers, then he must learn this care as well as another person.  At this point, the plan is for me to reconnect with sister tomorrow and hear if the potential caregiver can commit to, at least, bid visits to pt at home and to complete training here with RT.  Sister also to provide any update on housing.   [place] RN to see patient again tomorrow and will have most current status report on wound/ wound care needs for home.  RT to begin education with pt tomorrow as well. Have explained to both pt and sister that, if we cannot get all these arrangements in place and it is not safe  for him to d/c home alone or we don't have secured housing, then we will need to change plan to SNF. Both aware and understand.   Will update team tomorrow.  [name], LCSW

## 2017-07-22 NOTE — Progress Notes (Signed)
Occupational Therapy Weekly Progress Note  Patient Details  Name: Timothy Le MRN: [id number] Date of Birth: 12/31/1960  Beginning of progress report period: July 16, 2017 End of progress report period: July 22, 2017  Today's Date: 07/22/2017 OT Individual Time: 0805-0900 OT Individual Time Calculation (min): 55 min    Patient has met 3 of 3 short term goals.  Pt is making excellent progress towards OT goals. He is able to complete functional transfers at [facility]- supervision level using RW. He is able to complete ADLs in similar to manner as he did PTA, however, cont to be severely limited by poor functional activity tolerance, requiring seated rest breaks throughout ADL tasks. Pt anxious regarding d/c home due to medical concerns, cont to work with pt, therapy team and CSW for safest d/c disposition for pt. Pt cont to be very motivated and hard working during tx sessions.   Patient continues to demonstrate the following deficits: muscle weakness, decreased cardiorespiratoy endurance and decreased standing balance and therefore will continue to benefit from skilled OT intervention to enhance overall performance with BADL and Reduce care partner burden.  Patient progressing toward long term goals..  Plan of care revisions: Shower transfer goal d/c as pt not safe medically to complete showering at this time. .  OT Short Term Goals Week 1:  OT Short Term Goal 1 (Week 1): Pt will complete bathing with Mod A and AE as needed OT Short Term Goal 1 - Progress (Week 1): Met OT Short Term Goal 2 (Week 1): Pt will complete 1/3 components of donning pants with AE as needed OT Short Term Goal 2 - Progress (Week 1): Met OT Short Term Goal 3 (Week 1): Pt will complete 1/4 components of donning overhead shirt OT Short Term Goal 3 - Progress (Week 1): Met Week 2:  OT Short Term Goal 1 (Week 2): STG=LTG due to LOS  Skilled Therapeutic Interventions/Progress Updates:    Pt seen for OT ADL bathing/dressing  session and session focusing on functional transfers. Pt awake in supine upon arrival, denying pain and agreeable to tx session. He voiced need to complete toileting task. Pt came to sitting EOB using hospital bed functions as he has access to at home. Throughout session, completed stand pivot transfers with supervision, assist to steady equipment using RW. He completed 3/3 toileting tasks with guarding assist, tolerating standing long enough to complete hygiene, clothing management, and transfer to w/c.  He completed bathing task from w/c level with assist for set-up, donned hospital gown as no personal clothing available.  Pt desiring to put on shoes for first time, total A for donning socks and shoes. Pt very excited about wearing shoes, to practice with AE in next OT session. Pt left seated in w/c at end of session, all needs in reach.  Pt remained >98% O2 on 4L with activity.   Therapy Documentation Precautions:  Precautions Precautions: Fall Precaution Comments: trach Restrictions Weight Bearing Restrictions: No Pain:   No/denies pain ADL: ADL ADL Comments: Please see functional navigator for ADL status  See Function Navigator for Current Functional Status.   Therapy/Group: Individual Therapy  Xxavier Noon L 07/22/2017, 6:39 AM

## 2017-07-22 NOTE — Progress Notes (Signed)
Occupational Therapy Session Note  Patient Details  Name: Timothy Le MRN: [id number] Date of Birth: 11/17/1960  Today's Date: 07/22/2017 OT Individual Time: 0932-0956 OT Individual Time Calculation (min): 24 min    Short Term Goals: Week 1:  OT Short Term Goal 1 (Week 1): Pt will complete bathing with Mod A and AE as needed OT Short Term Goal 1 - Progress (Week 1): Met OT Short Term Goal 2 (Week 1): Pt will complete 1/3 components of donning pants with AE as needed OT Short Term Goal 2 - Progress (Week 1): Met OT Short Term Goal 3 (Week 1): Pt will complete 1/4 components of donning overhead shirt OT Short Term Goal 3 - Progress (Week 1): Met  Skilled Therapeutic Interventions/Progress Updates:    1:1. Pt seated in w/c upon arrival. Pt willing to work on AE use to don/doff shoes. OT installs elastic shoe laces into shoes. OT demo use of shoe funnel and reacher to don shoes. Pt able to return demo with VC for use of reacher to keep tongue of shoe up while sliding foot into shoes. Pt able to don B shoes with AE and min instructional cues overall. Exited session with pt seated in w/c with call light in reach and all needs met.   Therapy Documentation Precautions:  Precautions Precautions: Fall Precaution Comments: trach Restrictions Weight Bearing Restrictions: No General:   Vital Signs: Therapy Vitals Pulse Rate: 74 Resp: 18 Patient Position (if appropriate): Lying Oxygen Therapy SpO2: 100 % O2 Device: Tracheostomy Collar O2 Flow Rate (L/min): 5 L/min FiO2 (%): 28 % Pain: Pain Assessment Pain Scale: 0-10 Pain Score: 6  Pain Type: Acute pain Pain Location: Buttocks Pain Orientation: Posterior Pain Descriptors / Indicators: Aching Pain Frequency: Constant Pain Onset: On-going Pain Intervention(s): (had PRN at 0600)  See Function Navigator for Current Functional Status.   Therapy/Group: Individual Therapy   M  07/22/2017, 9:55 AM 

## 2017-07-22 NOTE — Progress Notes (Signed)
Physical Therapy Session Note  Patient Details  Name: Timothy Le MRN: [id number] Date of Birth: Apr 23, 1960  Today's Date: 07/22/2017 PT Individual Time: 1000-1030 PT Individual Time Calculation (min): 30 min   Short Term Goals: Week 1:  PT Short Term Goal 1 (Week 1): = LTG      Skilled Therapeutic Interventions/Progress Updates:  Pt on 4L O2 via trach collar, 28% FIO2.  Pt up in w/c, with shoes on and motivated to ambulate.  Gait training with RW, supervision on level tile (PT rolling O2 tank) x 38', x 52' wtihout turns.  O2 sats stayed above 94% throughout session; ex HR seated after ambulation 125.  HR decreased quickly to 110.  Pt left resting in w/c with needs at hand.     Therapy Documentation Precautions:  Precautions Precautions: Fall Precaution Comments: trach Restrictions Weight Bearing Restrictions: No Pain: Pain Assessment Pain Scale: 0-10 Pain Score: " a bit" L knee; premedicated    See Function Navigator for Current Functional Status.   Therapy/Group: Individual Therapy  Firas Guardado 07/22/2017, 10:41 AM

## 2017-07-22 NOTE — Progress Notes (Signed)
Physical Therapy Session Note  Patient Details  Name: Timothy Le MRN: [id number] Date of Birth: [birth year]  Today's Date: 07/22/2017 PT Individual Time: 1545-1630 PT Individual Time Calculation (min): 45 min   Short Term Goals: Week 1:  PT Short Term Goal 1 (Week 1): = LTG  Skilled Therapeutic Interventions/Progress Updates:    Pt received supine in bed, agreeable to PT. Pt reports 6/10 pain on sacral wound, some pain relief with repositioning. Supine to sit with Supervision with HOB elevated and use of bedrail. Sit to stand Supervision to RW. Ambulation x 5 ft from bed to bariatric recliner chair in room. Increased air in Roho cushion for improved pt comfort. Pt reports improved comfort and decreased sacral wound pain with aired up Roho, however once pt stands from chair Roho appears to have lost added air, will continue to assess best cushioning system for patient comfort and wound management. Seated BLE therex while seated in recliner. Returned to bed with Supervision. Pt left semi-reclined in bed with needs reach, HOB elevated to 30 degrees. Pt on 4L O2 throughout therapy session, SpO2 drops into 80's with activity secondary to poor O2 placement over trach with transfers, returns to 90% (+) with seated rest break and cannula repositioning.  Therapy Documentation Precautions:  Precautions Precautions: Fall Precaution Comments: trach Restrictions Weight Bearing Restrictions: No  See Function Navigator for Current Functional Status.   Therapy/Group: Individual Therapy  Excell Seltzer, PT, DPT  07/22/2017, 5:04 PM

## 2017-07-23 ENCOUNTER — Inpatient Hospital Stay (HOSPITAL_COMMUNITY): Payer: Medicaid Other | Admitting: Occupational Therapy

## 2017-07-23 ENCOUNTER — Inpatient Hospital Stay (HOSPITAL_COMMUNITY): Payer: Medicaid Other | Admitting: Physical Therapy

## 2017-07-23 ENCOUNTER — Inpatient Hospital Stay (HOSPITAL_COMMUNITY): Payer: Medicaid Other | Admitting: Speech Pathology

## 2017-07-23 LAB — CBC WITH DIFFERENTIAL/PLATELET
Abs Immature Granulocytes: 0 10*3/uL (ref 0.0–0.1)
BASOS ABS: 0 10*3/uL (ref 0.0–0.1)
Basophils Relative: 1 %
EOS ABS: 0.2 10*3/uL (ref 0.0–0.7)
EOS PCT: 3 %
HCT: 32.2 % — ABNORMAL LOW (ref 39.0–52.0)
Hemoglobin: 9.4 g/dL — ABNORMAL LOW (ref 13.0–17.0)
Immature Granulocytes: 1 %
Lymphocytes Relative: 42 %
Lymphs Abs: 2.7 10*3/uL (ref 0.7–4.0)
MCH: 26.2 pg (ref 26.0–34.0)
MCHC: 29.2 g/dL — AB (ref 30.0–36.0)
MCV: 89.7 fL (ref 78.0–100.0)
Monocytes Absolute: 0.6 10*3/uL (ref 0.1–1.0)
Monocytes Relative: 9 %
Neutro Abs: 2.8 10*3/uL (ref 1.7–7.7)
Neutrophils Relative %: 44 %
Platelets: 279 10*3/uL (ref 150–400)
RBC: 3.59 MIL/uL — AB (ref 4.22–5.81)
RDW: 17.2 % — AB (ref 11.5–15.5)
WBC: 6.3 10*3/uL (ref 4.0–10.5)

## 2017-07-23 LAB — COMPREHENSIVE METABOLIC PANEL
ALBUMIN: 2.5 g/dL — AB (ref 3.5–5.0)
ALK PHOS: 52 U/L (ref 38–126)
ALT: 31 U/L (ref 0–44)
AST: 36 U/L (ref 15–41)
Anion gap: 11 (ref 5–15)
BILIRUBIN TOTAL: 0.9 mg/dL (ref 0.3–1.2)
BUN: 7 mg/dL (ref 6–20)
CO2: 30 mmol/L (ref 22–32)
Calcium: 8.8 mg/dL — ABNORMAL LOW (ref 8.9–10.3)
Chloride: 96 mmol/L — ABNORMAL LOW (ref 98–111)
Creatinine, Ser: 0.89 mg/dL (ref 0.61–1.24)
GFR calc Af Amer: >60 mL/min (ref >60)
GFR calc non Af Amer: >60 mL/min (ref >60)
GLUCOSE: 98 mg/dL (ref 70–99)
POTASSIUM: 4.8 mmol/L (ref 3.5–5.1)
Sodium: 137 mmol/L (ref 135–145)
TOTAL PROTEIN: 6.9 g/dL (ref 6.5–8.1)

## 2017-07-23 LAB — GLUCOSE, CAPILLARY
GLUCOSE-CAPILLARY: 109 mg/dL — AB (ref 70–99)
Glucose-Capillary: 92 mg/dL (ref 70–99)
Glucose-Capillary: 123 mg/dL — ABNORMAL HIGH (ref 70–99)
Glucose-Capillary: 95 mg/dL (ref 70–99)

## 2017-07-23 NOTE — Progress Notes (Signed)
Speech Language Pathology Weekly Progress and Session Note  Patient Details  Name: Timothy Le MRN: [id number] Date of Birth: 12/15/1960  Beginning of progress report period: July 15, 2017 End of progress report period: July 23, 2017  Today's Date: 07/23/2017 SLP Individual Time: 1015-1055 SLP Individual Time Calculation (min): 40 min  Short Term Goals: Week 1: SLP Short Term Goal 1 (Week 1): Pt will donn and doff speaking valve with supervision verbal cues.  SLP Short Term Goal 1 - Progress (Week 1): Met SLP Short Term Goal 2 (Week 1): Pt will consume dys 3 textures and thin liquids with mod I use of swallowing precautions and minimal overt s/s of aspiration.  SLP Short Term Goal 2 - Progress (Week 1): Met SLP Short Term Goal 3 (Week 1): Pt will consume therapeutic trials of regular textures with mod I use of swallowing precautions and no overt s/s of aspiration.  SLP Short Term Goal 3 - Progress (Week 1): Discontinued (comment) SLP Short Term Goal 4 (Week 1): Pt will coordinate onset of phonation with respirations to sustain voicing at the sentence level.  SLP Short Term Goal 4 - Progress (Week 1): Met    New Short Term Goals: Week 2: SLP Short Term Goal 1 (Week 2): Patient will verbalize/demonstrate how to clean PMSV with Mod I.   Weekly Progress Updates: Patient has made excellent gains and has met 3 of 3 STG's this reporting period. Currently, patient is Mod I for donning/doffing PMSV and for use of speech intelligibility strategies to achieve 100% intelligibility at the conversation level. Patient is consuming Dys. 3 textures with thin liquids with minimal overt s/s of aspiration and is overall Mod I for use of swallowing compensatory strategies. Patient reported he prefers Dys. 3 textures and does not wish to upgrade to regular textures at this time. Patient will be seen for one more session for education prior to d/c from SLP services.      Intensity: Minumum of 1-2 x/day, 30  to 90 minutes Frequency: 3 to 5 out of 7 days Duration/Length of Stay: 07/26/17 from SLP services  Treatment/Interventions: [place];Internal/external aids;Environmental controls;Patient/family education   Daily Session  Skilled Therapeutic Interventions: Skilled treatment session focused on dysphagia goals. SLP facilitated session by providing skilled observation with lunch meal of Dys. 3 textures that his family brought him from home. Patient consumed meal without overt s/s of aspiration and was Mod I for use of swallowing compensatory strategies. Patient's sister present and educated in regards to his current swallowing function and appropriate textures. She verbalized understanding and agreement. Patient left upright in wheelchair with all needs within reach. Continue with current plan of care.       Function:   Eating Eating   Modified Consistency Diet: Yes Eating Assist Level: Swallowing techniques: self managed           Cognition Comprehension Comprehension assist level: Follows basic conversation/direction with no assist  Expression Expression assistive device: Talk trach valve Expression assist level: Expresses basic needs/ideas: With no assist  Social Interaction Social Interaction assist level: Interacts appropriately 90% of the time - Needs monitoring or encouragement for participation or interaction.  Problem Solving Problem solving assist level: Solves basic 90% of the time/requires cueing < 10% of the time  Memory Memory assist level: Recognizes or recalls 90% of the time/requires cueing < 10% of the time   Pain No/Denies Pain  Therapy/Group: Individual Therapy  Brittin Janik 07/23/2017, 3:36 PM

## 2017-07-23 NOTE — Progress Notes (Signed)
Attempted to perform trach education again.  Patient refused to allow education to be done, stating that he was too scared to learn this.  I explained the importance of learning these skills while in the hospital to gain comfort prior to being discharged, but he would not change his mind.  RN and CSW notified.

## 2017-07-23 NOTE — Progress Notes (Signed)
Physical Therapy Weekly Progress Note  Patient Details  Name: Timothy Le MRN: [id number] Date of Birth: 1960-07-19  Beginning of progress report period: July 16, 2017 End of progress report period: July 23, 2017  Today's Date: 07/23/2017 PT Individual Time: 1515-1600 PT Individual Time Calculation (min): 45 min   Patient has met 0 of 1 short term goals.  Pt's week one goals extended to week two (LTG due to ELOS).  Patient continues to demonstrate the following deficits muscle weakness and decreased cardiorespiratoy endurance and decreased oxygen support and therefore will continue to benefit from skilled PT intervention to increase functional independence with mobility.  Patient progressing toward long term goals..  Continue plan of care.  PT Short Term Goals Week 1:  PT Short Term Goal 1 (Week 1): = LTG PT Short Term Goal 1 - Progress (Week 1): Progressing toward goal Week 2:  PT Short Term Goal 1 (Week 2): = LTG due to ELOS  Skilled Therapeutic Interventions/Progress Updates:  Pt received semireclined in bed, agreeable to PT. Pt reports 9/10 pain in legs and sacral wound, RN provides medication at beginning of therapy session. Supine to sit Supervision with bedrails and HOB elevated. Sit to stand with Supervision to RW throughout therapy session. Session focus on endurance and increasing distance with ambulation. Ambulation 2 x 50 ft, 1 x 60 ft, 84 ft with RW and Supervision. Pt on 5-6L O2 throughout therapy session, SpO2 95% and above with activity. Pt returned to supine with HOB elevated to 30 degrees at end of session, needs in reach.  Therapy Documentation Precautions:  Precautions Precautions: Fall Precaution Comments: trach Restrictions Weight Bearing Restrictions: No  See Function Navigator for Current Functional Status.  Therapy/Group: Individual Therapy  Excell Seltzer, PT, DPT  07/23/2017, 4:42 PM

## 2017-07-23 NOTE — Progress Notes (Signed)
Occupational Therapy Session Note  Patient Details  Name: Timothy Le MRN: [id number] Date of Birth: [birth year]  Today's Date: 07/23/2017 OT Individual Time: 0800-0900 OT Individual Time Calculation (min): 60 min    Short Term Goals: Week 2:  OT Short Term Goal 1 (Week 2): STG=LTG due to LOS  Skilled Therapeutic Interventions/Progress Updates:    Pt seen for OT session focusing on functional transfers and ADL re-training. Pt in supine upon arrival, voiced increased fatigue following having had "medication get stuck" in throat and working to get it out. Pt initially declining therapy, however, with encouragement willing to work with therapist to transfer to [facility] for toileting task.  He transferred to EOB mod I using hospital bed functions which he has access to at home. Completed stand pivot transfer to toilet using RW with supervision, pt able to pull down pants while thearpist managed gown. Pt completed remainder of toileting task with supervision. He returned to w/c to complete bathing task at sink, pt requiring significantly increased time and rest breaks to complete task due to decreased functional activity tolerance. Used AE to assist with LB bathing/dressing PRN with VCs for technique/ use. With use of sock aid, reacher, and shoe funnel, pt able to independently don B socks/shoes, pt very excited about his new level of independence.  Following rest break, he self propelled w/c to therapy gym using B LEs, rest breaks required throughout. Pt left seated in w/c in gym at end of session, awaiting, hand off to PT.   Therapy Documentation Precautions:  Precautions Precautions: Fall Precaution Comments: trach Restrictions Weight Bearing Restrictions: No Pain:   No/denies pain ADL: ADL ADL Comments: Please see functional navigator for ADL status  See Function Navigator for Current Functional Status.   Therapy/Group: Individual Therapy  Hadlyn Amero L 07/23/2017, 6:34 AM

## 2017-07-23 NOTE — Progress Notes (Signed)
Patient c/o something in the throat. RN changed the disposable cannula and suctioned him. Requested PMV to be used this morning as well. Patient still feels something in his throat. Oxygen saturation in the 90's. RT called to see patient.

## 2017-07-23 NOTE — Progress Notes (Signed)
Physical Therapy Session Note  Patient Details  Name: Timothy Le MRN: [id number] Date of Birth: 10/21/1960  Today's Date: 07/23/2017 PT Individual Time:900-945     Short Term Goals: Week 1:  PT Short Term Goal 1 (Week 1): = LTG  Skilled Therapeutic Interventions/Progress Updates:   Pt received sitting in WC in rehab gym and agreeable to PT. PT instructed pt in gait training with RW and supervision assist form PT for safety and portable O2 management, 59f, 1048fand 6038fSpO2 continuously monitored by PT and noted to remain >92% on 4L/min supplemental O2. Sit<>stand prior to each out of gait training completed with supervision assist with min cues for UE placement to improve safety. WC mobility instructed by PT x [id number] supervision assist and multiple rest breaks due to UE fatigue. BLE therex completed seated at  Kinetron 4 x 45sec at 20cm/sec with cues for full ROM. Patient returned to room and [facility] and all needs met.         Therapy Documentation Precautions:  Precautions Precautions: Fall Precaution Comments: trach Restrictions Weight Bearing Restrictions: No Vital Signs: Therapy Vitals Temp: 99.2 F (37.3 C) Temp Source: Oral Pulse Rate: 70 Resp: 18 BP: (!) 109/57 Patient Position (if appropriate): Lying Oxygen Therapy SpO2: 94 % O2 Device: Tracheostomy Collar Pain:   denies at rest   See Function Navigator for Current Functional Status.   Therapy/Group: Individual Therapy  AustLorie Phenix/2019, 8:00 AM

## 2017-07-23 NOTE — Progress Notes (Signed)
[facility] PHYSICAL MEDICINE & REHABILITATION     PROGRESS NOTE    Subjective/Complaints: Patient seen lying in bed this morning. He states he did not sleep well overnight because he had issues with breathing. He states that he was suctioned but didn't not help and then he was finally able to cough up a mucous plug. Encourage patient to attempt to cough versus relying on suctioning. Patient also complaining about the fact that he has a PEG tube and is not being used. Attempted to educate patient, patient continues to state that he would like it removed.  ROS: Denies CP, SOB, N/V/D  Objective:  No results found. Recent Labs    07/23/17 0653  WBC 6.3  HGB 9.4*  HCT 32.2*  PLT 279   Recent Labs    07/23/17 0653  NA 137  K 4.8  CL 96*  GLUCOSE 98  BUN 7  CREATININE 0.89  CALCIUM 8.8*   CBG (last 3)  Recent Labs    07/22/17 1644 07/22/17 2056 07/23/17 0638  GLUCAP 92 105* 92    Wt Readings from Last 3 Encounters:  07/22/17 (!) 197.3 kg (434 lb 15.5 oz)  07/15/17 (!) 172.4 kg (380 lb)  01/20/17 (!) 166 kg (366 lb)    Intake/Output Summary (Last 24 hours) at 07/23/2017 1113 Last data filed at 07/23/2017 0829 Gross per 24 hour  Intake 1080 ml  Output 1150 ml  Net -70 ml    Vital Signs: Blood pressure (!) 109/57, pulse 70, temperature 99.2 F (37.3 C), temperature source Oral, resp. rate 18, height 5\' 6"  (1.676 m), weight (!) 197.3 kg (434 lb 15.5 oz), SpO2 94 %. Physical Exam:  Constitutional: No distress . Vital signs reviewed. Obese. HENT: Normocephalic.  Atraumatic. +Trach. Eyes: EOMI. No discharge. Cardiovascular: RRR. No JVD.Marland Kitchen Respiratory: CTA bilateral. Normal effort. GI: BS +. Non-distended. +PEG. Musc: lower extremity edema. Neurological: He is alert. Motor: 4/5 proximal to distal throughout (stable) Skin:Sacral decub not examined today Psychiatric: Normal motor. Normal behavior.  Assessment/Plan: 1. Functional deficits secondary to debility after  VDRF which require 3+ hours per day of interdisciplinary therapy in a comprehensive inpatient rehab setting. Physiatrist is providing close team supervision and 24 hour management of active medical problems listed below. Physiatrist and rehab team continue to assess barriers to discharge/monitor patient progress toward functional and medical goals.  Function:  Bathing Bathing position   Position: Wheelchair/chair at sink  Bathing parts Body parts bathed by patient: Right arm, Left arm, Chest, Abdomen, Front perineal area, Buttocks, Right upper leg, Left upper leg, Right lower leg, Left lower leg Body parts bathed by helper: Right lower leg, Left lower leg, Back  Bathing assist Assist Level: Supervision or verbal cues(LH sponge)      Upper Body Dressing/Undressing Upper body dressing   What is the patient wearing?: Hospital gown     Pull over shirt/dress - Perfomed by patient: Thread/unthread right sleeve, Thread/unthread left sleeve, Put head through opening, Pull shirt over trunk          Upper body assist Assist Level: Touching or steadying assistance(Pt > 75%)      Lower Body Dressing/Undressing Lower body dressing   What is the patient wearing?: Non-skid slipper socks, Hospital Gown, Shoes Underwear - Performed by patient: Thread/unthread right underwear leg, Thread/unthread left underwear leg, Pull underwear up/down   Pants- Performed by patient: Thread/unthread right pants leg, Thread/unthread left pants leg, Pull pants up/down   Non-skid slipper socks- Performed by patient: Don/doff right  sock, Don/doff left sock(Sock aid) Non-skid slipper socks- Performed by helper: Don/doff right sock, Don/doff left sock     Shoes - Performed by patient: Don/doff right shoe, Don/doff left shoe(elastic shoe laces and shoe funnel) Shoes - Performed by helper: Don/doff right shoe, Don/doff left shoe, Fasten right, Fasten left          Lower body assist Assist for lower body dressing:  Supervision or verbal cues      Toileting Toileting Toileting activity did not occur: N/A Toileting steps completed by patient: Adjust clothing prior to toileting, Adjust clothing after toileting, Performs perineal hygiene Toileting steps completed by helper: Adjust clothing prior to toileting, Performs perineal hygiene, Adjust clothing after toileting Toileting Assistive Devices: Toilet aid  Toileting assist Assist level: Supervision or verbal cues   Transfers Chair/bed transfer   Chair/bed transfer method: Ambulatory Chair/bed transfer assist level: Supervision or verbal cues Chair/bed transfer assistive device: Armrests, Medical sales representative     Max distance: 52 Assist level: Supervision or verbal cues   Wheelchair          Cognition Comprehension Comprehension assist level: Follows basic conversation/direction with no assist  Expression Expression assist level: Expresses basic needs/ideas: With no assist  Social Interaction Social Interaction assist level: Interacts appropriately 90% of the time - Needs monitoring or encouragement for participation or interaction.  Problem Solving Problem solving assist level: Solves basic 90% of the time/requires cueing < 10% of the time  Memory Memory assist level: Recognizes or recalls 90% of the time/requires cueing < 10% of the time   Medical Problem List and Plan: 1.Debilitysecondary to VDRF/multi-medicalissues  Cont CIR 2. DVT Prophylaxis/Anticoagulation: Subcutaneous Lovenox. Monitor for any bleeding episodes 3. Pain Management:Tylenol as needed 4. Mood:Provide emotional support 5. Neuropsych: This patientiscapable of making decisions on hisown behalf. 6. Skin/Wound Care/sacrococcyx decubitus:area clean  continue packing of sacral wound, local care, air mattress  appreciate WOC RN follow up  Doxycycline completed on 7/1 7. Fluids/Electrolytes/Nutrition:good po intake so far  BMP within acceptable  range on 7/5  D3 thins, advance diet as tolerated 8.Tracheostomy 06/17/2017 per Dr. Cleaster Corin to cuffless XLT #6 07/09/2017.    Speech therapy follow-up   -continue PMV.  -NO PLAN FOR DECANNULATION UNTIL WEIGHT LOSS 9.Gastrostomy tube 07/05/2017. Diet advanced to mechanical soft thin liquids. 10.Diabetes mellitus. Hemoglobin A1c 5.4. Lantus insulin 10 units nightly.   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 Follow-up dietary  Continue to educate 12.Acute on chronic anemia.   Hb 9.4 on 7/5 13.Hypertension. Demadex 10 mg daily.  Relatively controlled on 7/5 14. HLD  Lipitor started on 7/1 15. Transaminitis: Resolved  LFTs within normal limits on 7/5  LOS (Days) 8 A FACE TO FACE EVALUATION WAS PERFORMED  Ankit Lorie Phenix, MD 07/23/2017 11:13 AM

## 2017-07-24 ENCOUNTER — Inpatient Hospital Stay (HOSPITAL_COMMUNITY): Payer: Medicaid Other | Admitting: Speech Pathology

## 2017-07-24 ENCOUNTER — Inpatient Hospital Stay (HOSPITAL_COMMUNITY): Payer: Medicaid Other | Admitting: Occupational Therapy

## 2017-07-24 ENCOUNTER — Other Ambulatory Visit: Payer: Self-pay

## 2017-07-24 LAB — GLUCOSE, CAPILLARY
Glucose-Capillary: 95 mg/dL (ref 70–99)
Glucose-Capillary: 102 mg/dL — ABNORMAL HIGH (ref 70–99)
Glucose-Capillary: 101 mg/dL — ABNORMAL HIGH (ref 70–99)
Glucose-Capillary: 115 mg/dL — ABNORMAL HIGH (ref 70–99)

## 2017-07-24 NOTE — Progress Notes (Addendum)
Occupational Therapy Session Note  Patient Details  Name: TRAVEION RUDDOCK MRN: [id number] Date of Birth: 1960-06-24  Today's Date: 07/24/2017 OT Group Time: 1100-1200 OT Group Time Calculation (min): 60 min   Skilled Therapeutic Interventions/Progress Updates:    Pt participated in therapeutic w/c level dance group with focus on UE/LE strengthening, activity tolerance, and social participation for carryover during self care tasks. Pt was guided through various dance-based exercises involving UB/LB and trunk. He required several prolonged rest breaks throughout due to endurance deficits and opted to focus mostly on LE mobility during exercises. Pt interacted with other group members and requested songs, reported that he was glad he came. At end of tx he was escorted back to room and left with NT for toileting.   Therapy Documentation Precautions:  Precautions Precautions: Fall Precaution Comments: trach Restrictions Weight Bearing Restrictions: No Vital Signs: Therapy Vitals Pulse Rate: 88 Resp: (!) 22 Patient Position (if appropriate): Sitting Oxygen Therapy SpO2: 96 % O2 Device: Tracheostomy Collar FiO2 (%): 28 % ADL: ADL ADL Comments: Please see functional navigator for ADL status     See Function Navigator for Current Functional Status.   Therapy/Group: Group Therapy  Abelina Ketron A Kina Shiffman 07/24/2017, 12:39 PM

## 2017-07-24 NOTE — Progress Notes (Signed)
Timothy Le is a 57 y.o. male 1960/08/01 [id number]  Subjective: No new complaints. No new problems. Slept well. Feeling OK.  Objective: Vital signs in last 24 hours: Temp:  [98.5 F (36.9 C)-99.2 F (37.3 C)] 98.5 F (36.9 C) (07/06 0508) Pulse Rate:  [65-102] 102 (07/06 0837) Resp:  [18-22] 22 (07/06 0837) BP: (113-121)/(54-63) 113/62 (07/06 0508) SpO2:  [95 %-100 %] 95 % (07/06 0837) FiO2 (%):  [28 %] 28 % (07/06 0837) Weight change:  Last BM Date: 07/24/17  Intake/Output from previous day: 07/05 0701 - 07/06 0700 In: 705 [P.O.:705] Out: 550 [Urine:550] Last cbgs: CBG (last 3)  Recent Labs    07/23/17 1628 07/23/17 2115 07/24/17 0635  GLUCAP 109* 95 95     Physical Exam General: No apparent distress..   very obese HEENT: not dry tracheostomy. Lungs: Normal effort. Lungs clear to auscultation, no crackles or wheezes. Cardiovascular: Regular rate and rhythm, no edema Abdomen: S/NT/ND; BS(+).  Large with a gastrostomy tube Musculoskeletal:  unchanged Neurological: No new neurological deficits Wounds: N/A    Skin: skin tags. Wound NE Mental state: Alert, oriented, cooperative    Lab Results: BMET    Component Value Date/Time   NA 137 07/23/2017 0653   K 4.8 07/23/2017 0653   CL 96 (L) 07/23/2017 0653   CO2 30 07/23/2017 0653   GLUCOSE 98 07/23/2017 0653   BUN 7 07/23/2017 0653   CREATININE 0.89 07/23/2017 0653   CALCIUM 8.8 (L) 07/23/2017 0653   GFRNONAA >60 07/23/2017 0653   GFRAA >60 07/23/2017 0653   CBC    Component Value Date/Time   WBC 6.3 07/23/2017 0653   RBC 3.59 (L) 07/23/2017 0653   HGB 9.4 (L) 07/23/2017 0653   HCT 32.2 (L) 07/23/2017 0653   PLT 279 07/23/2017 0653   MCV 89.7 07/23/2017 0653   MCH 26.2 07/23/2017 0653   MCHC 29.2 (L) 07/23/2017 0653   RDW 17.2 (H) 07/23/2017 0653   LYMPHSABS 2.7 07/23/2017 0653   MONOABS 0.6 07/23/2017 0653   EOSABS 0.2 07/23/2017 0653   BASOSABS 0.0 07/23/2017 0653     Studies/Results: No results found.  Medications: I have reviewed the patient's current medications.  Assessment/Plan:     1. Debilitysecondary to VDRF/multi-medicalissues. CIR 2.  DVT prophylaxis with Lovenox 3.  Pain management with Tylenol PRN 4.  Sacral decubitus.  Continue with air mattress and local care 5.  Tracheostomy.  Routine care 6.  Gastrostomy tube placed on 07/05/2017.  Diet is advanced to mechanical soft, thin liquids 7.  Type 2 diabetes.  On Lantus 8.  Severe obesity with BMI of 70.21 9.  Acute on chronic anemia.  Continue to monitor 10.  Hypertension.  Demadex 10 mg daily      Length of stay, days: 9  Walker Kehr , MD 07/24/2017, 11:21 AM

## 2017-07-24 NOTE — Progress Notes (Signed)
Physical Therapy Session Note  Patient Details  Name: Timothy Le MRN: [id number] Date of Birth: 12/16/1960  Today's Date: 07/24/2017 PT Individual Time: 1055-1105 PT Individual Time Calculation (min): 10 min   Short Term Goals: Week 2:  PT Short Term Goal 1 (Week 2): = LTG due to ELOS  Skilled Therapeutic Interventions/Progress Updates: Pt received in w/c with therapist arriving to transport to OT group; pt requests to put on clothing as he does not want to participate in the group in only a gown. Shirt donned setupA to obtain clothing. Requires minA for pulling up underwear and pants d/t dressing over sacral wound. TotalA for donning shoes. S sit <>stand with RW. Transported to day room totalA; remained in w/c with handoff to OT.      Therapy Documentation Precautions:  Precautions Precautions: Fall Precaution Comments: trach Restrictions Weight Bearing Restrictions: No Pain: Pain Assessment Pain Scale: 0-10 Pain Score: 4  Pain Intervention(s): Medication (See eMAR)   See Function Navigator for Current Functional Status.   Therapy/Group: Individual Therapy  Luberta Mutter 07/24/2017, 11:06 AM

## 2017-07-25 ENCOUNTER — Inpatient Hospital Stay (HOSPITAL_COMMUNITY): Payer: Medicaid Other

## 2017-07-25 ENCOUNTER — Inpatient Hospital Stay (HOSPITAL_COMMUNITY): Payer: Medicaid Other | Admitting: Physical Therapy

## 2017-07-25 LAB — GLUCOSE, CAPILLARY
GLUCOSE-CAPILLARY: 86 mg/dL (ref 70–99)
Glucose-Capillary: 91 mg/dL (ref 70–99)
Glucose-Capillary: 103 mg/dL — ABNORMAL HIGH (ref 70–99)
Glucose-Capillary: 114 mg/dL — ABNORMAL HIGH (ref 70–99)

## 2017-07-25 NOTE — Progress Notes (Signed)
Timothy Le is a 57 y.o. male [birth year] [id number]  Subjective: No new complaints. No new problems. Slept well. Feeling OK.  Objective: Vital signs in last 24 hours: Temp:  [98 F (36.7 C)-98.6 F (37 C)] 98.6 F (37 C) (07/07 0441) Pulse Rate:  [63-90] 75 (07/07 0441) Resp:  [18-22] 18 (07/07 0441) BP: (113-144)/(74-93) 127/74 (07/07 0441) SpO2:  [95 %-100 %] 100 % (07/07 0752) FiO2 (%):  [28 %] 28 % (07/07 0752) Weight change:  Last BM Date: 07/25/17  Intake/Output from previous day: 07/06 0701 - 07/07 0700 In: 360 [P.O.:360] Out: 1625 [Urine:1625] Last cbgs: CBG (last 3)  Recent Labs    07/24/17 1654 07/24/17 2126 07/25/17 0639  GLUCAP 101* 115* 91     Physical Exam General: No apparent distress.  Morbidly obese.  In a wheelchair HEENT: not dry Lungs: Normal effort. Lungs clear to auscultation, no crackles or wheezes. Cardiovascular: Regular rate and rhythm, no edema Abdomen: S/NT/ND; BS(+) Musculoskeletal:  unchanged Neurological: No new neurological deficits Wounds: Tracheostomy, gastrostomy-clean Skin: clear  Mental state: Alert, oriented, cooperative    Lab Results: BMET    Component Value Date/Time   NA 137 07/23/2017 0653   K 4.8 07/23/2017 0653   CL 96 (L) 07/23/2017 0653   CO2 30 07/23/2017 0653   GLUCOSE 98 07/23/2017 0653   BUN 7 07/23/2017 0653   CREATININE 0.89 07/23/2017 0653   CALCIUM 8.8 (L) 07/23/2017 0653   GFRNONAA >60 07/23/2017 0653   GFRAA >60 07/23/2017 0653   CBC    Component Value Date/Time   WBC 6.3 07/23/2017 0653   RBC 3.59 (L) 07/23/2017 0653   HGB 9.4 (L) 07/23/2017 0653   HCT 32.2 (L) 07/23/2017 0653   PLT 279 07/23/2017 0653   MCV 89.7 07/23/2017 0653   MCH 26.2 07/23/2017 0653   MCHC 29.2 (L) 07/23/2017 0653   RDW 17.2 (H) 07/23/2017 0653   LYMPHSABS 2.7 07/23/2017 0653   MONOABS 0.6 07/23/2017 0653   EOSABS 0.2 07/23/2017 0653   BASOSABS 0.0 07/23/2017 0653    Studies/Results: No results  found.  Medications: I have reviewed the patient's current medications.  Assessment/Plan:      1.  VDRF/multi-medical issues.  Debility.  CIR 2.  DVT prophylaxis with Lovenox 3.  Pain management with as needed Tylenol 4.  Sacral decubitus ulcer.  Continue with an air mattress and local care 5.  Status post tracheostomy.  Routine care 6.  Type 2 diabetes.  Continue with Lantus. 7.  Gastrostomy tube placed on 07/05/2017.  Diet is being advanced to mechanical soft, thin liquids.  The patient is complaining of discomfort in the area of tube placement.  May need to be checked by GI 8.  Severe obesity with body mass index of 70 9.  Acute on chronic anemia.  Continue to monitor CBC 10.  Hypertension.  He is on Demadex 10 mg daily        Length of stay, days: 10  Walker Kehr , MD 07/25/2017, 10:14 AM

## 2017-07-25 NOTE — Progress Notes (Signed)
Occupational Therapy Session Note  Patient Details  Name: Timothy Le MRN: [id number] Date of Birth: 1960/08/03  Today's Date: 07/25/2017 OT Individual Time: 0700-0756 OT Individual Time Calculation (min): 56 min    Short Term Goals: Week 2:  OT Short Term Goal 1 (Week 2): STG=LTG due to LOS  Skilled Therapeutic Interventions/Progress Updates:    1:1. Pain reported in buttocks. RN aware and already administered medication. Therapy to tolerance. Pt supine>sitting EOB with supervision. Pt reports needing to urinate. Continent void in urinal in standing with RW. Pt transfers with RW throughout session EOB>w/c<>BSC with RW and supervision. Pt requires VC for locking w/c brakes prior to transfer and reaching throughout session. Pt completes bathing at sink level with supervision and increased time for rest breaks d/t decreased endurance. Pt able to reach down to ankles with washcloth, however requires LHSS to wash B feet. Pt toilets in middle of bathing (no clothing management needed as pt not wearing any clothes). Pt cleanses buttocks in standing with RW with A for thorough buttock hygiene. Pt dons shirt with set up and A to manage O2. Pt uses sock aide to don socks and pt does not reqire reacher to thread BLE into pants. Pt stands with supervision and is able to advance pants past hips this date. Exited session with pt set up with breakfast try, RN in room and call light in reach  Therapy Documentation Precautions:  Precautions Precautions: Fall Precaution Comments: trach Restrictions Weight Bearing Restrictions: No General:   Vital Signs: Therapy Vitals Temp: 98.6 F (37 C) Temp Source: Oral Pulse Rate: 75 Resp: 18 BP: 127/74 Patient Position (if appropriate): Sitting Oxygen Therapy SpO2: 100 % O2 Device: Tracheostomy Collar O2 Flow Rate (L/min): 10 L/min FiO2 (%): 28 %  See Function Navigator for Current Functional Status.   Therapy/Group: Individual Therapy  Tonny Branch 07/25/2017, 6:54 AM

## 2017-07-25 NOTE — Progress Notes (Signed)
Physical Therapy Session Note  Patient Details  Name: Timothy Le MRN: [id number] Date of Birth: [birth year]  Today's Date: 07/25/2017 PT Individual Time: 1345-1440 PT Individual Time Calculation (min): 55 min   Short Term Goals: Week 1:  PT Short Term Goal 1 (Week 1): = LTG PT Short Term Goal 1 - Progress (Week 1): Progressing toward goal  Skilled Therapeutic Interventions/Progress Updates:  Pt was seen bedside in the pm. Pt transferred supine to edge of bed with S and side rail. Pt performed multiple sit to stand and stand pivot transfers with S. Pt tolerated edge of bed about 15 minutes with S. Pt ambulated 115, 165 anf 105 feet with rolling walker and S. Pt's O2 sat remained above 90% during treatment. Pt's HR increased into the 130s with ambulation but quickly returned to baseline with rest. Pt left sitting up on edge of bed with call bell within reach.   Therapy Documentation Precautions:  Precautions Precautions: Fall Precaution Comments: trach Restrictions Weight Bearing Restrictions: No General:   Vital Signs: Therapy Vitals Oxygen Therapy O2 Device: Room Air O2 Flow Rate (L/min): 8 L/min FiO2 (%): 28 % Pain: No c/o pain   See Function Navigator for Current Functional Status.   Therapy/Group: Individual Therapy  Dub Amis 07/25/2017, 2:53 PM

## 2017-07-25 NOTE — Progress Notes (Signed)
RT note-Called to room for moderate distress. Patient states he can not breath well. Inner canula evaluated, clean and not obstructed. 2 to 3 drops of saline distilled into trach, patient with good strong cough and small thick plug spontaneous coughed out. Patient states that he feels better, all vital signs normal. Patient left sitting on side of bed, RN aware. Continue to monitor.

## 2017-07-25 NOTE — Plan of Care (Signed)
  Problem: Consults Goal: RH GENERAL PATIENT EDUCATION Description See Patient Education module for education specifics. Outcome: Progressing Goal: Skin Care Protocol Initiated - if Braden Score 18 or less Description If consults are not indicated, leave blank or document N/A Outcome: Progressing Goal: Nutrition Consult-if indicated Outcome: Progressing Goal: Diabetes Guidelines if Diabetic/Glucose > 140 Description If diabetic or lab glucose is > 140 mg/dl - Initiate Diabetes/Hyperglycemia Guidelines & Document Interventions  Outcome: Progressing   Problem: RH BOWEL ELIMINATION Goal: RH STG MANAGE BOWEL WITH ASSISTANCE Description STG Manage Bowel with min Assistance.  Outcome: Progressing Goal: RH STG MANAGE BOWEL W/MEDICATION W/ASSISTANCE Description STG Manage Bowel with Medication with min Assistance.  Outcome: Progressing   Problem: RH BLADDER ELIMINATION Goal: RH STG MANAGE BLADDER WITH ASSISTANCE Description STG Manage Bladder With min Assistance  Outcome: Progressing Goal: RH STG MANAGE BLADDER WITH MEDICATION WITH ASSISTANCE Description STG Manage Bladder With Medication With min Assistance.  Outcome: Progressing   Problem: RH SKIN INTEGRITY Goal: RH STG SKIN FREE OF INFECTION/BREAKDOWN Description Healing wound and no new skin breakdown with mod assistance  Outcome: Progressing Goal: RH STG MAINTAIN SKIN INTEGRITY WITH ASSISTANCE Description STG Maintain Skin Integrity With mod Assistance.  Outcome: Progressing Goal: RH STG ABLE TO PERFORM INCISION/WOUND CARE W/ASSISTANCE Description STG Able To Perform Incision/Wound Care With max Assistance.  Outcome: Progressing   Problem: RH SAFETY Goal: RH STG ADHERE TO SAFETY PRECAUTIONS W/ASSISTANCE/DEVICE Description STG Adhere to Safety Precautions With min Assistance/Device.  Outcome: Progressing Goal: RH STG DECREASED RISK OF FALL WITH ASSISTANCE Description STG Decreased Risk of Fall With min Assistance.   Outcome: Progressing Goal: RH STG DEMO UNDERSTANDING HOME SAFETY PRECAUTIONS Description Patient able to verbalize home safety precaution with min assistance  Outcome: Progressing   Problem: RH PAIN MANAGEMENT Goal: RH STG PAIN MANAGED AT OR BELOW PT'S PAIN GOAL Description Pain < or = 5 with min assistance  Outcome: Progressing   Problem: RH KNOWLEDGE DEFICIT GENERAL Goal: RH STG INCREASE KNOWLEDGE OF SELF CARE AFTER HOSPITALIZATION Description Patient able to verbalize self care with min assistance  Outcome: Progressing

## 2017-07-26 ENCOUNTER — Inpatient Hospital Stay (HOSPITAL_COMMUNITY): Payer: Medicaid Other | Admitting: Speech Pathology

## 2017-07-26 ENCOUNTER — Inpatient Hospital Stay (HOSPITAL_COMMUNITY): Payer: Medicaid Other | Admitting: Occupational Therapy

## 2017-07-26 ENCOUNTER — Inpatient Hospital Stay (HOSPITAL_COMMUNITY): Payer: Medicaid Other | Admitting: Physical Therapy

## 2017-07-26 ENCOUNTER — Inpatient Hospital Stay (HOSPITAL_COMMUNITY): Payer: Medicaid Other

## 2017-07-26 LAB — GLUCOSE, CAPILLARY
GLUCOSE-CAPILLARY: 94 mg/dL (ref 70–99)
GLUCOSE-CAPILLARY: 103 mg/dL — AB (ref 70–99)
Glucose-Capillary: 88 mg/dL (ref 70–99)
Glucose-Capillary: 94 mg/dL (ref 70–99)

## 2017-07-26 NOTE — Progress Notes (Signed)
Patient states that RN just suctioned him. Patient does not wish to be suctioned at this time. Vitals are stable. RT will continue to monitor.

## 2017-07-26 NOTE — Consult Note (Signed)
[place] Nurse wound consult note Reason for Consult: asked to re-evaluate patient's sacral wound for updated orders. SW is working on placement and needs updated care orders.  Wound type: MDRPI from trach, almost healed. Protect site with silicone foam Stage 4 pressure injury; sacrum; updated wound care orders  Pressure Injury POA: No Measurement: 5cm x 3.5cm x 0.2cm (in gluteal cleft) I am only measuring depth of wound bed. Surface level in the gluteal cleft with no depth Wound bed:95% clean, early granulation tissue/ 5% grey/brown tissue at wound base  Drainage (amount, consistency, odor) moderate, with green color today but not malodorous  Periwound: intact  Dressing procedure/placement/frequency: Change to silver hydrofiber for drainage management and with change in color of drainage. DCed enzymatic debridement ointment, wound bed is essentially clean at this point. Change dressing daily. Low air loss mattress in place for moisture management and for pressure redistribution.  Maximize nutrition for wound healing.  Consider adding zinc and Vit. C for wound healing. Juven for wound healing at time of DC to home.  [place] Nurse team will follow along with you for weekly wound assessments.  Please notify me of any acute changes in the wounds or any new areas of concerns D[place] MSN, RN,CWOCN, [place], [place]

## 2017-07-26 NOTE — Progress Notes (Signed)
[facility] PHYSICAL MEDICINE & REHABILITATION     PROGRESS NOTE    Subjective/Complaints: Sitting eob. Wants something different for breakfast. Wants PEG out.   ROS: Patient denies fever, rash, sore throat, blurred vision, nausea, vomiting, diarrhea, cough, shortness of breath or chest pain, joint or back pain, headache, or mood change.    Objective:  No results found. No results for input(s): WBC, HGB, HCT, PLT in the last 72 hours. No results for input(s): NA, K, CL, GLUCOSE, BUN, CREATININE, CALCIUM in the last 72 hours.  Invalid input(s): CO CBG (last 3)  Recent Labs    07/25/17 1639 07/25/17 2110 07/26/17 0621  GLUCAP 103* 114* 88    Wt Readings from Last 3 Encounters:  07/25/17 (!) 145.6 kg (321 lb)  07/15/17 (!) 172.4 kg (380 lb)  01/20/17 (!) 166 kg (366 lb)    Intake/Output Summary (Last 24 hours) at 07/26/2017 0840 Last data filed at 07/26/2017 0700 Gross per 24 hour  Intake 840 ml  Output 1925 ml  Net -1085 ml    Vital Signs: Blood pressure 122/72, pulse 84, temperature 99 F (37.2 C), temperature source Oral, resp. rate 16, height 5\' 6"  (1.676 m), weight (!) 145.6 kg (321 lb), SpO2 100 %. Physical Exam:  Constitutional: No distress . Vital signs reviewed. HEENT: EOMI, oral membranes moist,trach PMV Neck: supple, voice dysphonic Cardiovascular: RRR without murmur. No JVD    Respiratory: CTA Bilaterally without wheezes or rales. Normal effort    GI: BS +, non-tender, non-distended, PEG Musc: lower extremity edema. Neurological: He is alert. Motor: 4/5 proximal to distal throughout (stable) Skin:Sacral decub closing Psychiatric: Normal motor. Normal behavior.  Assessment/Plan: 1. Functional deficits secondary to debility after VDRF which require 3+ hours per day of interdisciplinary therapy in a comprehensive inpatient rehab setting. Physiatrist is providing close team supervision and 24 hour management of active medical problems listed  below. Physiatrist and rehab team continue to assess barriers to discharge/monitor patient progress toward functional and medical goals.  Function:  Bathing Bathing position   Position: Wheelchair/chair at sink  Bathing parts Body parts bathed by patient: Right arm, Left arm, Chest, Abdomen, Front perineal area, Buttocks, Right upper leg, Left upper leg, Right lower leg, Left lower leg Body parts bathed by helper: Right lower leg, Left lower leg, Back  Bathing assist Assist Level: Supervision or verbal cues(LH sponge)      Upper Body Dressing/Undressing Upper body dressing   What is the patient wearing?: Pull over shirt/dress     Pull over shirt/dress - Perfomed by patient: Thread/unthread right sleeve, Thread/unthread left sleeve, Put head through opening, Pull shirt over trunk          Upper body assist Assist Level: Set up   Set up : To obtain clothing/put away  Lower Body Dressing/Undressing Lower body dressing   What is the patient wearing?: Pants, Underwear, Shoes Underwear - Performed by patient: Thread/unthread right underwear leg, Thread/unthread left underwear leg Underwear - Performed by helper: Pull underwear up/down Pants- Performed by patient: Thread/unthread right pants leg, Thread/unthread left pants leg Pants- Performed by helper: Pull pants up/down Non-skid slipper socks- Performed by patient: Don/doff right sock, Don/doff left sock(Sock aid) Non-skid slipper socks- Performed by helper: Don/doff right sock, Don/doff left sock     Shoes - Performed by patient: Don/doff right shoe, Don/doff left shoe(elastic shoe laces and shoe funnel) Shoes - Performed by helper: Don/doff right shoe, Don/doff left shoe, Fasten left, Fasten right  Lower body assist Assist for lower body dressing: Touching or steadying assistance (Pt > 75%)      Toileting Toileting Toileting activity did not occur: N/A Toileting steps completed by patient: Adjust clothing prior  to toileting Toileting steps completed by helper: Performs perineal hygiene, Adjust clothing after toileting Toileting Assistive Devices: Toilet aid, Grab bar or rail  Toileting assist Assist level: Touching or steadying assistance (Pt.75%)   Transfers Chair/bed transfer   Chair/bed transfer method: Stand pivot Chair/bed transfer assist level: Supervision or verbal cues Chair/bed transfer assistive device: Armrests     Locomotion Ambulation     Max distance: 165 Assist level: Supervision or verbal cues   Wheelchair          Cognition Comprehension Comprehension assist level: Follows basic conversation/direction with extra time/assistive device  Expression Expression assist level: Expresses basic needs/ideas: With extra time/assistive device  Social Interaction Social Interaction assist level: Interacts appropriately 90% of the time - Needs monitoring or encouragement for participation or interaction.  Problem Solving Problem solving assist level: Solves basic 90% of the time/requires cueing < 10% of the time  Memory Memory assist level: Recognizes or recalls 75 - 89% of the time/requires cueing 10 - 24% of the time   Medical Problem List and Plan: 1.Debilitysecondary to VDRF/multi-medicalissues  Cont CIR 2. DVT Prophylaxis/Anticoagulation: Subcutaneous Lovenox. Monitor for any bleeding episodes 3. Pain Management:Tylenol as needed 4. Mood:Provide emotional support 5. Neuropsych: This patientiscapable of making decisions on hisown behalf. 6. Skin/Wound Care/sacrococcyx decubitus:area clean  continue packing of sacral wound, local care, air mattress  appreciate WOC RN follow up  Doxycycline completed on 7/1 7. Fluids/Electrolytes/Nutrition:good po intake so far  BMP within acceptable range on 7/5  D3 thins, advance diet as tolerated 8.Tracheostomy 06/17/2017 per Dr. Cleaster Corin to cuffless XLT #6 07/09/2017.    Speech therapy follow-up   -continue  PMV.  -NO PLAN FOR DECANNULATION UNTIL WEIGHT LOSS 9.Gastrostomy tube 07/05/2017. Diet advanced to mechanical soft thin liquids.  - I expressed to patient that PEG needs to be in about 6 weeks prior to removal 10.Diabetes mellitus. Hemoglobin A1c 5.4. Lantus insulin 10 units nightly.   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 Follow-up dietary  Continue to educate 12.Acute on chronic anemia.   Hb 9.4 on 7/8 13.Hypertension. Demadex 10 mg daily.  Relatively controlled on 7/8 14. HLD  Lipitor started on 7/1 15. Transaminitis: Resolved  LFTs within normal limits on 7/5  LOS (Days) 11 A FACE TO FACE EVALUATION WAS PERFORMED  Meredith Staggers, MD 07/26/2017 8:40 AM

## 2017-07-26 NOTE — Progress Notes (Signed)
Physical Therapy Session Note  Patient Details  Name: Timothy Le MRN: [id number] Date of Birth: 1960-10-21  Today's Date: 07/26/2017 PT Individual Time: 0800-0905 PT Individual Time Calculation (min): 65 min   Short Term Goals: Week 1:  PT Short Term Goal 1 (Week 1): = LTG PT Short Term Goal 1 - Progress (Week 1): Progressing toward goal Week 2:  PT Short Term Goal 1 (Week 2): = LTG due to ELOS      Skilled Therapeutic Interventions/Progress Updates:   Pt sitting EOB.  He stated he needed to use toilet for BM and to urinate.  PT placed BSC over toilet, and pt ambulated to BR, for transfer with RW , supervision.  Pt continent of both.  BM hard, pellet shaped. Pt stated that he strained a little having BM, although he is on stool softener.  Hand hygiene seated in w/c at sink, with set up.  Gait training with RW room >< gym, with supervision.  Pt has extreme bil hip external rotation, with heels nearly bumping.  W/c follow, but not needed.  Therapeutic exercise performed with LE to increase strength for functional mobility: bil hip adduction with feet separated, x 10 with 3 second hold.  Sit> stand without use of RW, pushing up on armrests, to stand and match playing cards to board in front of him, approx 1 minute, x 2,  before fatiguing.  Pt 's L knee pain limits sit> stand.  O2 sats 100% on 4L.    Pt left resting in w/c with needs at hand.     Therapy Documentation Precautions:  Precautions Precautions: Fall Precaution Comments: trach Restrictions Weight Bearing Restrictions: No Vital Signs: Therapy Vitals Pulse Rate: 125 Patient Position (if appropriate): sitting, after ambulation Oxygen Therapy SpO2: 97 O2 Device: Tracheostomy Collar FiO2 (%): 28 % Pain: 5/10 sacral wound; premedicated Pain Assessment Pain Scale: 0-10 Pain Score: 5 Pain Intervention(s): Medication (See eMAR);Environmental changes;Repositioned     See Function Navigator for Current Functional  Status.   Therapy/Group: Individual Therapy  Vanecia Limpert 07/26/2017, 10:27 AM

## 2017-07-26 NOTE — Progress Notes (Signed)
Physical Therapy Session Note  Patient Details  Name: DERREON CONSALVO MRN: [id number] Date of Birth: 30-Sep-1960  Today's Date: 07/26/2017 PT Individual Time: 0240-9735 PT Individual Time Calculation (min): 42 min   Short Term Goals: Week 2:  PT Short Term Goal 1 (Week 2): = LTG due to ELOS  Skilled Therapeutic Interventions/Progress Updates:    pt in w/c agreeable to therapy.  Pt performs gait x 100' with RW with min guard fading to supervision.  Sit <> stand x 5 with prolonged rest breaks with supervision, increased time.  Standing heel raises 2 x 10, standing mini squats x 5 due to low activity tolerance.  Pt requests seated therex.  Pt performs with 4# dowel 3 x 10 shoulder press, bicep curl, shoulder horiz abd/add, chest press, circles CW/CCW.  Pt performs stand pivot to bed with close supervision and sit to supine with supervision, increased time. Pt left in bed with needs at hand. spO2 at 95% on 5LO2  Therapy Documentation Precautions:  Precautions Precautions: Fall Precaution Comments: trach Restrictions Weight Bearing Restrictions: No Vital Signs: Therapy Vitals Pulse Rate: 84 Resp: 18 Patient Position (if appropriate): Sitting Oxygen Therapy SpO2: 94 % O2 Device: Tracheostomy Collar O2 Flow Rate (L/min): 5 L/min FiO2 (%): 28 % Pain:  no c/o pain   Therapy/Group: Individual Therapy  DONAWERTH,KAREN 07/26/2017, 1:44 PM

## 2017-07-26 NOTE — Progress Notes (Signed)
Speech Language Pathology Discharge Summary  Patient Details  Name: Timothy Le MRN: [id number] Date of Birth: August 09, 1960  Today's Date: 07/26/2017 SLP Individual Time: 1130-1155 SLP Individual Time Calculation (min): 25 min   Skilled Therapeutic Interventions:  Skilled treatment session focused on dysphagia goals and completion of education. Patient consumed Dys. 3 textures with thin liquids without overt s/s of aspiration with Mod I. Patient educated on how to clean PMSV, patient verbalized understanding. Patient will be discharged from skilled SLP intervention due to all goals being met at this time.   Patient has met 4 of 4 long term goals.  Patient to discharge at overall Modified Independent level.   Reasons goals not met: N/A   Clinical Impression/Discharge Summary: Patient has made excellent gains and has met 4 of 4 LTG's this admission. Currently, patient is consuming Dys. 3 textures (per his preference) with thin liquids without overt s/s of aspiration and overall Mod I for use of swallowing compensatory strategies. Patient is tolerating his PMSV during all waking hours and is 100% intelligible at the conversation level with Mod I. Patient can also independently donn/doff his PMSV. Patient education complete and patient will be discharged from skilled SLP intervention due to all goals being met with f/u not warranted at this time.   Care Partner:  Caregiver Able to Provide Assistance: No  Type of Caregiver Assistance: Physical  Recommendation:  None      Equipment: N/A   Reasons for discharge: Treatment goals met   Patient/Family Agrees with Progress Made and Goals Achieved: Yes   Function:  Eating Eating   Modified Consistency Diet: Yes Eating Assist Level: No help, No cues           Cognition Comprehension Comprehension assist level: Follows basic conversation/direction with extra time/assistive device  Expression Expression assistive device: Talk trach  valve Expression assist level: Expresses basic needs/ideas: With extra time/assistive device  Social Interaction Social Interaction assist level: Interacts appropriately 90% of the time - Needs monitoring or encouragement for participation or interaction.  Problem Solving Problem solving assist level: Solves basic 90% of the time/requires cueing < 10% of the time  Memory Memory assist level: Recognizes or recalls 90% of the time/requires cueing < 10% of the time   [name] 07/26/2017, 1:00 PM

## 2017-07-26 NOTE — Progress Notes (Addendum)
RT came to room to assess patient/trach but patient is not there. RT will attempt again when patient is back from PT.

## 2017-07-26 NOTE — Progress Notes (Signed)
Occupational Therapy Session Note  Patient Details  Name: SHENOUDA GENOVA MRN: [id number] Date of Birth: 1960-09-10  Today's Date: 07/26/2017 OT Individual Time: 1000-1100 OT Individual Time Calculation (min): 60 min   Short Term Goals: Week 1:  OT Short Term Goal 1 (Week 1): Pt will complete bathing with Mod A and AE as needed OT Short Term Goal 1 - Progress (Week 1): Met OT Short Term Goal 2 (Week 1): Pt will complete 1/3 components of donning pants with AE as needed OT Short Term Goal 2 - Progress (Week 1): Met OT Short Term Goal 3 (Week 1): Pt will complete 1/4 components of donning overhead shirt OT Short Term Goal 3 - Progress (Week 1): Met  Skilled Therapeutic Interventions/Progress Updates:    OT treatment session focused on activity tolerance and modified bathing/dressing tasks. Pt requests to use shower cap and complete bathing tasks wc at the sink 2/2 fatigue. B UE strengthening and coordination with hair washing task. Pt needed 2 rest breaks from UE task. Pt on 5 L of O2 throughout session. Sit<>stand with supervision and tolerated 3 minutes standing at longest bout x 2. Pt able to wash peri-area in standing, but needed assistance to wash buttocks for thoroughness. Pt took extended rest break after standing activities. LB dressing completed without AD with close supervision. Pt also needed min A to manage O2 cords while changing shirts. Pt left seated in wc at end of session with needs met.   Therapy Documentation Precautions:  Precautions Precautions: Fall Precaution Comments: trach Restrictions Weight Bearing Restrictions: No Vital Signs: Therapy Vitals Pulse Rate: 92 Resp: 18 Patient Position (if appropriate): Sitting Oxygen Therapy SpO2: 97 % O2 Device: Tracheostomy Collar O2 Flow Rate (L/min): 5 L/min FiO2 (%): 28 % Pain: Pain Assessment Pain Scale: 0-10 Pain Score: 5  Pain Intervention(s): Repositioned ADL: ADL ADL Comments: Please see functional navigator  for ADL status  See Function Navigator for Current Functional Status.  Therapy/Group: Individual Therapy  Valma Cava 07/26/2017, 10:56 AM

## 2017-07-26 NOTE — NC FL2 (Signed)
[place] LEVEL OF CARE SCREENING TOOL     IDENTIFICATION  Patient Name: Timothy Le Birthdate: 1960-08-24 Sex: male Admission Date (Current Location): 07/15/2017  [place] and [place] Number:  Kathleen Argue [id number] Facility and Address:  [facility], [place] [address] [place], [place] [address]      Provider Number: [id number]  Attending Physician Name and Address:  Meredith Staggers, MD  Relative Name and Phone Number:       Current Level of Care: Other (Comment)(Acute Inpatient Rehab Program) Recommended Level of Care: [place] Prior Approval Number:    Date Approved/Denied:   PASRR Number: [id number]  Discharge Plan: SNF    Current Diagnoses: Patient Active Problem List   Diagnosis Date Noted  . Transaminitis   . Super-super obese ([place])   . Benign essential HTN   . Hyperlipidemia   . PEG (percutaneous endoscopic gastrostomy) status ([place])   . Labile blood pressure   . Acute blood loss anemia   . Anemia of chronic disease   . Diabetes mellitus type 2 in obese ([place])   . Sacral wound   . Tracheostomy status ([place])   . Debility 07/15/2017  . Chronic respiratory failure with hypoxia ([place]) 07/09/2017  . Abscess of groin, left   . Encounter for PEG (percutaneous endoscopic gastrostomy) ([place])   . Acute pulmonary edema (HCC)   . HCAP (healthcare-associated pneumonia)   . ARDS (adult respiratory distress syndrome) ([place])   . Pressure injury of skin 06/19/2017  . Difficult airway for intubation   . Respiratory distress 06/09/2017  . Respiratory failure with hypoxia and hypercapnia ([place]) 06/09/2017  . COPD with acute exacerbation ([place]) 06/17/2016  . Seasonal and perennial allergic rhinitis 05/24/2016  . AKI (acute kidney injury) ([place]) 04/08/2015  . Hypovolemic shock ([place]) 04/08/2015  . Sepsis ([place]) 04/08/2015  . Hyponatremia 04/08/2015  . HTN (hypertension) 03/28/2015  . Near syncope 03/28/2015  . Type 2  diabetes mellitus with hyperosmolar nonketotic hyperglycemia ([place]) 03/28/2015  . Hyperglycemia 03/27/2015  . Acute on chronic respiratory failure with hypoxemia ([place]) 03/27/2015  . Acute on chronic congestive heart failure ([place])   . COPD exacerbation ([place]) 02/07/2015  . Pressure ulcer 12/28/2014  . COPD mixed type ([place]) 12/27/2014  . Acute respiratory failure ([place]) 12/27/2014  . Morbid obesity ([place]) 12/27/2014  . Diabetes mellitus type 2, controlled ([place]) 12/27/2014    Class: Chronic  . Obstructive sleep apnea 07/29/2013  . Peripheral edema 07/29/2013    Orientation RESPIRATION BLADDER Height & Weight     Self, Time, Situation, Place  Tracheostomy, O2(PMSV during wake hours) Continent Weight: (bed not functioning correctly) Height:  5\' 6"  (167.6 cm)  BEHAVIORAL SYMPTOMS/MOOD NEUROLOGICAL BOWEL NUTRITION STATUS      Continent Diet(Dys 3, thin (still has a peg tube but only flushing this))  AMBULATORY STATUS COMMUNICATION OF NEEDS Skin   Supervision Verbally PU Stage and Appropriate Care(Stage 4 to sacrum;  santyl with moist to dry dressing bid)       PU Stage 4 Dressing: BID               Personal Care Assistance Level of Assistance  Bathing, Dressing Bathing Assistance: Limited assistance Feeding assistance: Independent Dressing Assistance: Limited assistance     Functional Limitations Info             SPECIAL CARE FACTORS FREQUENCY  OT (By licensed OT), PT (By licensed PT)     PT Frequency: 5x/wk OT Frequency: 5x/wk  Contractures Contractures Info: Not present    Additional Factors Info  Code Status, Allergies Code Status Info: Full Allergies Info: NKDA           Current Medications (07/26/2017):  This is the current hospital active medication list Current Facility-Administered Medications  Medication Dose Route Frequency Provider Last Rate Last Dose  . acetaminophen (TYLENOL) tablet 650 mg  650 mg Oral Q6H PRN Cathlyn Parsons, PA-C    650 mg at 07/21/17 0957  . atorvastatin (LIPITOR) tablet 10 mg  10 mg Oral q1800 Jamse Arn, MD   10 mg at 07/25/17 1638  . bisacodyl (DULCOLAX) suppository 10 mg  10 mg Rectal Daily PRN Angiulli, Lavon Paganini, PA-C      . collagenase (SANTYL) ointment   Topical Daily Angiulli, Lavon Paganini, PA-C      . docusate sodium (COLACE) capsule 100 mg  100 mg Oral BID Meredith Staggers, MD   100 mg at 07/26/17 0744  . enoxaparin (LOVENOX) injection 80 mg  80 mg Subcutaneous Q24H Cathlyn Parsons, PA-C   80 mg at 07/25/17 1638  . insulin glargine (LANTUS) injection 10 Units  10 Units Subcutaneous QHS Cathlyn Parsons, PA-C   10 Units at 07/25/17 2015  . mineral oil-hydrophilic petrolatum (AQUAPHOR) ointment   Topical BID Angiulli, Lavon Paganini, PA-C      . multivitamin with minerals tablet 1 tablet  1 tablet Oral Daily Meredith Staggers, MD   1 tablet at 07/26/17 [phone]  . nystatin (MYCOSTATIN) 100000 UNIT/ML suspension 500,000 Units  5 mL Oral QID Cathlyn Parsons, PA-C   500,000 Units at 07/26/17 1159  . ondansetron (ZOFRAN) 4 MG/5ML solution 4 mg  4 mg Oral Q8H PRN Cathlyn Parsons, PA-C   4 mg at 07/16/17 2140  . oxyCODONE (Oxy IR/ROXICODONE) immediate release tablet 5-10 mg  5-10 mg Oral Q4H PRN Cathlyn Parsons, PA-C   10 mg at 07/26/17 1159  . polyethylene glycol (MIRALAX / GLYCOLAX) packet 17 g  17 g Oral Daily PRN Cathlyn Parsons, PA-C   17 g at 07/18/17 1740  . potassium chloride SA (K-DUR,KLOR-CON) CR tablet 20 mEq  20 mEq Oral Daily Meredith Staggers, MD   20 mEq at 07/26/17 0745  . sodium chloride flush (NS) 0.9 % injection 10-40 mL  10-40 mL Intracatheter PRN Meredith Staggers, MD   30 mL at 07/16/17 0408  . torsemide (DEMADEX) tablet 10 mg  10 mg Per Tube Daily AngiulliLavon Paganini, PA-C   10 mg at 07/26/17 6283  . vitamin C (ASCORBIC ACID) tablet 250 mg  250 mg Per Tube Daily Cathlyn Parsons, PA-C   250 mg at 07/26/17 1517     Discharge Medications: Please see discharge summary for  a list of discharge medications.  Relevant Imaging Results:  Relevant Lab Results:   Additional Information [id number]  Lennart Pall, LCSW

## 2017-07-26 NOTE — Progress Notes (Signed)
Social Work Patient ID: Timothy Le, male   DOB: 1960-06-07, 57 y.o.   MRN: [id number]  Have spoken with pt and sister, Timothy Le, today to review status of housing as well as care support available to pt if he is to d/c home.  Sister notes she is still trying to get a new apartment (ground floor) for pt but is having to work with [employer] which is slowing down the process.  She does not know when to expect new dwelling to be available.  Also, family can only provide intermittent support "a couple of times a week" per sister.  She adds that there is not a family member who feels "comfortable" learning the trach and wound care pt will need at home.  Pt has been declining trach education from RT as well.  He does state today that he "will try" but that he is "scared".   Given the entire situation, I have explained to both that there is really no other option but to pursue SNF at this time while they continue to work on housing and his medical recovery.  Stressed to both, however, that pt DOES need to learn trach care when it is offered while here so that he can become more comfortable to eventually perform this at home after SNF.  Both pt and sister agreed to change in d/c plan to SNF.  FL2 has been completed and is out to area facilities.  Will keep team posted on bed offers.  Noriah Osgood, LCSW

## 2017-07-27 ENCOUNTER — Inpatient Hospital Stay (HOSPITAL_COMMUNITY): Payer: Medicaid Other | Admitting: Physical Therapy

## 2017-07-27 ENCOUNTER — Inpatient Hospital Stay (HOSPITAL_COMMUNITY): Payer: Medicaid Other

## 2017-07-27 ENCOUNTER — Inpatient Hospital Stay (HOSPITAL_COMMUNITY): Payer: Medicaid Other | Admitting: Occupational Therapy

## 2017-07-27 LAB — GLUCOSE, CAPILLARY
GLUCOSE-CAPILLARY: 85 mg/dL (ref 70–99)
Glucose-Capillary: 109 mg/dL — ABNORMAL HIGH (ref 70–99)
Glucose-Capillary: 105 mg/dL — ABNORMAL HIGH (ref 70–99)
Glucose-Capillary: 115 mg/dL — ABNORMAL HIGH (ref 70–99)

## 2017-07-27 NOTE — Progress Notes (Signed)
Called to patients room while I was on floor pt is struggling to catch his breath RN at bedside has suctioned and changed inner cannula.  Patient states inner cannula has not been changed before that or suctioned on day shift.  I suctioned and lavaged patient several times and retrieved a good amount of secretions as well as patient coughing up a small plug.  RN and myself assisted patient to wheel chair so he could sit up and breathe better.  Patient states much better now.  RT and RN will continue to monitor and assess patient.

## 2017-07-27 NOTE — Progress Notes (Signed)
Occupational Therapy Session Note  Patient Details  Name: Timothy Le MRN: [id number] Date of Birth: [birth year]  Today's Date: 07/27/2017 OT Individual Time: 1030-1130 OT Individual Time Calculation (min): 60 min    Short Term Goals: Week 1:  OT Short Term Goal 1 (Week 1): Pt will complete bathing with Mod A and AE as needed OT Short Term Goal 1 - Progress (Week 1): Met OT Short Term Goal 2 (Week 1): Pt will complete 1/3 components of donning pants with AE as needed OT Short Term Goal 2 - Progress (Week 1): Met OT Short Term Goal 3 (Week 1): Pt will complete 1/4 components of donning overhead shirt OT Short Term Goal 3 - Progress (Week 1): Met Week 2:  OT Short Term Goal 1 (Week 2): STG=LTG due to LOS      Skilled Therapeutic Interventions/Progress Updates:    Pt seen this session to focus on functional sit to stand and standing balance.  Pt received in w/c and he stated that he wanted to wash up at the sink. Pt very focused on his abdominal pain from his g tube site and was quite distracted by his pain. He was able to participate but needed numerous rest breaks.  Pt was able to stand from w/c and stand with S.  He did need A to wash his bottom this morning as he said it hurt to try to reach around to twist.  Pt completed self care then felt he could not breath, RN called in to A pt with suction.  Pt felt better and was calm.  Pt set up in w/c with all needs met.    LTGs of standing and sit to stand and toileting downgraded from I to S as pt is not able to be fully I with his trach tube at this time.  Therapy Documentation Precautions:  Precautions Precautions: Fall Precaution Comments: trach Restrictions Weight Bearing Restrictions: No    Vital Signs: Therapy Vitals Pulse Rate: 85 Resp: 18 Patient Position (if appropriate): Sitting Oxygen Therapy SpO2: 98 % O2 Device: Tracheostomy Collar O2 Flow Rate (L/min): 5 L/min FiO2 (%): 28 % Pain: Pain Assessment Pain Scale:  0-10 Pain Score: 10-Worst pain ever Faces Pain Scale: Hurts whole lot Pain Type: Chronic pain Pain Location: Abdomen(g tube site) Pain Descriptors / Indicators: Aching;Burning Pain Onset: On-going Pain Intervention(s): Rest ADL: ADL ADL Comments: Please see functional navigator for ADL status  See Function Navigator for Current Functional Status.   Therapy/Group: Individual Therapy  [place] 07/27/2017, 11:00 AM

## 2017-07-27 NOTE — Progress Notes (Signed)
RT NOTE: RT has been to patients room to assess trach 2x but patient not available both times. Patient is in PT. RT will attempt again in about 30 minutes when patient should be back.

## 2017-07-27 NOTE — Progress Notes (Signed)
[place] PHYSICAL MEDICINE & REHABILITATION     PROGRESS NOTE    Subjective/Complaints: Anxious, perseverative re: PEG. Feels tired all the time.   ROS: Patient denies fever, rash, sore throat, blurred vision, nausea, vomiting, diarrhea, cough, shortness of breath or chest pain, joint or back pain, headache.    Objective:  No results found. No results for input(s): WBC, HGB, HCT, PLT in the last 72 hours. No results for input(s): NA, K, CL, GLUCOSE, BUN, CREATININE, CALCIUM in the last 72 hours.  Invalid input(s): CO CBG (last 3)  Recent Labs    07/26/17 1626 07/26/17 2056 07/27/17 0636  GLUCAP 94 103* 85    Wt Readings from Last 3 Encounters:  07/15/17 (!) 172.4 kg (380 lb)  01/20/17 (!) 166 kg (366 lb)  10/10/16 (!) 166 kg (366 lb)    Intake/Output Summary (Last 24 hours) at 07/27/2017 0846 Last data filed at 07/27/2017 0757 Gross per 24 hour  Intake 480 ml  Output 600 ml  Net -120 ml    Vital Signs: Blood pressure 122/63, pulse 65, temperature 99 F (37.2 C), temperature source Oral, resp. rate 19, height 5\' 6"  (1.676 m), weight (!) 145.6 kg (321 lb), SpO2 99 %. Physical Exam:  Constitutional: No distress . Vital signs reviewed. Morbidly obese HEENT: EOMI, oral membranes moist Neck: supple, trach, hoarse Cardiovascular: RRR without murmur. No JVD    Respiratory: CTA Bilaterally without wheezes or rales. Normal effort    GI: BS +, distended. Tender at PEG, it is very tight on abdomen  Musc: lower extremity edema. Neurological: He is alert. Motor: 4/5 proximal to distal throughout (stable) Skin:Sacral decub closing Psychiatric: anxious  Assessment/Plan: 1. Functional deficits secondary to debility after VDRF which require 3+ hours per day of interdisciplinary therapy in a comprehensive inpatient rehab setting. Physiatrist is providing close team supervision and 24 hour management of active medical problems listed below. Physiatrist and rehab team continue to  assess barriers to discharge/monitor patient progress toward functional and medical goals.  Function:  Bathing Bathing position   Position: Wheelchair/chair at sink  Bathing parts Body parts bathed by patient: Right arm, Left arm, Chest, Abdomen, Front perineal area, Buttocks, Right upper leg, Left upper leg, Right lower leg, Left lower leg Body parts bathed by helper: Right lower leg, Left lower leg, Back  Bathing assist Assist Level: Supervision or verbal cues(LH sponge)      Upper Body Dressing/Undressing Upper body dressing   What is the patient wearing?: Pull over shirt/dress     Pull over shirt/dress - Perfomed by patient: Thread/unthread right sleeve, Thread/unthread left sleeve, Put head through opening, Pull shirt over trunk          Upper body assist Assist Level: Set up   Set up : To obtain clothing/put away  Lower Body Dressing/Undressing Lower body dressing   What is the patient wearing?: Pants, Underwear, Shoes Underwear - Performed by patient: Thread/unthread right underwear leg, Thread/unthread left underwear leg Underwear - Performed by helper: Pull underwear up/down Pants- Performed by patient: Thread/unthread right pants leg, Thread/unthread left pants leg Pants- Performed by helper: Pull pants up/down Non-skid slipper socks- Performed by patient: Don/doff right sock, Don/doff left sock(Sock aid) Non-skid slipper socks- Performed by helper: Don/doff right sock, Don/doff left sock     Shoes - Performed by patient: Don/doff right shoe, Don/doff left shoe(elastic shoe laces and shoe funnel) Shoes - Performed by helper: Don/doff right shoe, Don/doff left shoe, Fasten left, Fasten right  Lower body assist Assist for lower body dressing: Touching or steadying assistance (Pt > 75%)      Toileting Toileting Toileting activity did not occur: N/A Toileting steps completed by patient: Adjust clothing prior to toileting Toileting steps completed by  helper: Performs perineal hygiene, Adjust clothing after toileting Toileting Assistive Devices: Toilet aid, Grab bar or rail  Toileting assist Assist level: Supervision or verbal cues   Transfers Chair/bed transfer   Chair/bed transfer method: Stand pivot Chair/bed transfer assist level: Supervision or verbal cues Chair/bed transfer assistive device: Armrests     Locomotion Ambulation     Max distance: 100 Assist level: Supervision or verbal cues   Wheelchair          Cognition Comprehension Comprehension assist level: Follows basic conversation/direction with extra time/assistive device  Expression Expression assist level: Expresses basic needs/ideas: With extra time/assistive device  Social Interaction Social Interaction assist level: Interacts appropriately 90% of the time - Needs monitoring or encouragement for participation or interaction.  Problem Solving Problem solving assist level: Solves basic 90% of the time/requires cueing < 10% of the time  Memory Memory assist level: Recognizes or recalls 90% of the time/requires cueing < 10% of the time   Medical Problem List and Plan: 1.Debilitysecondary to VDRF/multi-medicalissues  Cont CIR, now SNF pending 2. DVT Prophylaxis/Anticoagulation: Subcutaneous Lovenox. Monitor for any bleeding episodes 3. Pain Management:Tylenol as needed 4. Mood:Provide emotional support 5. Neuropsych: This patientiscapable of making decisions on hisown behalf. 6. Skin/Wound Care/sacrococcyx decubitus:area clean  continue packing of sacral wound, local care, air mattress  appreciate WOC RN follow up  Doxycycline completed on 7/1 7. Fluids/Electrolytes/Nutrition:good po intake so far  BMP within acceptable range on 7/5  D3 thins,   8.Tracheostomy 06/17/2017 per Dr. Cleaster Corin to cuffless XLT #6 07/09/2017.    Speech therapy follow-up   -continue PMV.  -NO PLAN FOR DECANNULATION UNTIL WEIGHT LOSS 9.Gastrostomy tube  07/05/2017. Diet advanced to mechanical soft thin liquids.  - IPEG needs to be in about 6 weeks prior to removal  -I loosened flange of PEG which seemed to help with discomfort 10.Diabetes mellitus. Hemoglobin A1c 5.4. Lantus insulin 10 units nightly.   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 Follow-up dietary  Continue to educate 12.Acute on chronic anemia.   Hb 9.4 on 7/8 13.Hypertension. Demadex 10 mg daily.   controlled on 7/9 14. HLD  Lipitor started on 7/1 15. Transaminitis: Resolved  LFTs within normal limits on 7/5  LOS (Days) 12 A FACE TO FACE EVALUATION WAS PERFORMED  Meredith Staggers, MD 07/27/2017 8:46 AM

## 2017-07-27 NOTE — Progress Notes (Signed)
Physical Therapy Session Note  Patient Details  Name: Timothy Le MRN: [id number] Date of Birth: Sep 22, 1960  Today's Date: 07/27/2017 PT Individual Time: 0900-1000 and 1638-4665 PT Individual Time Calculation (min): 60 min and 60 min   Short Term Goals: Week 1:  PT Short Term Goal 1 (Week 1): = LTG PT Short Term Goal 1 - Progress (Week 1): Progressing toward goal  Skilled Therapeutic Interventions/Progress Updates:   Session 1:  Pt seated in w/c upon PT arrival, agreeable to therapy tx and denies pain. Pt transported to the gym. Pt initiated stair training this session for LE strengthening and endurance, pt ascended/descended one 3 inch step x 5 with B handrails and min assist, seated rest break with SpO2 at 98%. Pt ascended/descended six 3 inch steps with B handrails and min assist, step to pattern, SpO2 98% on 3L. Pt transported to the ortho gym. Pt performed car transfer via ambulation with RW and supervision, verbal cues for techniques. Pt transported outside. Pt seated in w/c performed LE therex for strengthening: 2 x 10 LAQ, hip flexion and ankle pumps. Pt ambulated x 20 ft with RW and supervision. Pt transported back to room at end of session, left seated with needs in reach.   Session 2: Pt supine in bed upon PT arrival, agreeable to therapy tx and reports pain in abdomen area 7/10. Pt requesting to stey in bed this session and reports he would be in too much pain trying to get OOB, agreeable to bed level exercises. Pt performed the following UE and LE therex for strengthening, 2 x 10 of each: SLR, heel slides, SAQ, supine hip adduction with orange TB, supine hip flexion marches, glute sets, sidelying hip abduction, hooklying hip abduction with orange TB, ankle pumps, shoulder horizontal abduction with orange theraband, chest press with 5# dowel, bicep curls with 5# dowel, shoulder flexion with 5# dowel. Therapist providing cues throughout for proper techniques, form and muscle  activation. Pt agreeable to now try OOB activity. Pt transferred to sitting EOB with supervision and ambulated from bed<>hallway x 50 ft and 2 x 65 ft with RW and supervision, verbal cues for upright posture and decreased reliance through UEs, working on endurance and activity tolerance. Pt left supine in bed at end of session with needs in reach.   Therapy Documentation Precautions:  Precautions Precautions: Fall Precaution Comments: trach Restrictions Weight Bearing Restrictions: No   See Function Navigator for Current Functional Status.   Therapy/Group: Individual Therapy  Netta Corrigan, PT, DPT 07/27/2017, 7:54 AM

## 2017-07-28 ENCOUNTER — Inpatient Hospital Stay (HOSPITAL_COMMUNITY): Payer: Medicaid Other | Admitting: Physical Therapy

## 2017-07-28 ENCOUNTER — Inpatient Hospital Stay (HOSPITAL_COMMUNITY): Payer: Medicaid Other | Admitting: Occupational Therapy

## 2017-07-28 LAB — GLUCOSE, CAPILLARY
GLUCOSE-CAPILLARY: 99 mg/dL (ref 70–99)
GLUCOSE-CAPILLARY: 93 mg/dL (ref 70–99)
GLUCOSE-CAPILLARY: 104 mg/dL — AB (ref 70–99)
Glucose-Capillary: 120 mg/dL — ABNORMAL HIGH (ref 70–99)

## 2017-07-28 MED ORDER — DOCUSATE SODIUM 100 MG PO CAPS: 100.0000 mg | ORAL_CAPSULE | Freq: Two times a day (BID) | ORAL | 0 refills | Status: DC

## 2017-07-28 MED ORDER — ADULT MULTIVITAMIN W/MINERALS CH: 1.0000 | ORAL_TABLET | Freq: Every day | ORAL | Status: DC

## 2017-07-28 MED ORDER — INSULIN GLARGINE 100 UNIT/ML ~~LOC~~ SOLN: 10.0000 [IU] | Freq: Every day | SUBCUTANEOUS | 11 refills | Status: DC

## 2017-07-28 MED ORDER — SENNOSIDES-DOCUSATE SODIUM 8.6-50 MG PO TABS
2.0000 | ORAL_TABLET | Freq: Every day | ORAL | Status: DC
  Administered 2017-07-28: 2 via ORAL
  Filled 2017-07-28: qty 2

## 2017-07-28 MED ORDER — POTASSIUM CHLORIDE CRYS ER 20 MEQ PO TBCR: 20.0000 meq | EXTENDED_RELEASE_TABLET | Freq: Every day | ORAL | Status: DC

## 2017-07-28 MED ORDER — ACETAMINOPHEN 325 MG PO TABS: 650.0000 mg | ORAL_TABLET | Freq: Four times a day (QID) | ORAL | Status: DC | PRN

## 2017-07-28 MED ORDER — SENNOSIDES-DOCUSATE SODIUM 8.6-50 MG PO TABS: 2.0000 | ORAL_TABLET | Freq: Every day | ORAL | Status: DC

## 2017-07-28 NOTE — Patient Care Conference (Signed)
Inpatient RehabilitationTeam Conference and Plan of Care Update Date: 07/27/2017   Time: 2:30 PM    Patient Name: Timothy Le      Medical Record Number: [id number]  Date of Birth: 08-20-1960 Sex: Male         Room/Bed: 4W15C/4W15C-01 Payor Info: Payor: MEDICAID [place] / Plan: MEDICAID [place] ACCESS / Product Type: *No Product type* /    Admitting Diagnosis: debility  Admit Date/Time:  07/15/2017  4:25 PM Admission Comments: No comment available   Primary Diagnosis:  <principal problem not specified> Principal Problem: <principal problem not specified>  Patient Active Problem List   Diagnosis Date Noted  . Transaminitis   . Super-super obese ([place])   . Benign essential HTN   . Hyperlipidemia   . PEG (percutaneous endoscopic gastrostomy) status ([place])   . Labile blood pressure   . Acute blood loss anemia   . Anemia of chronic disease   . Diabetes mellitus type 2 in obese ([place])   . Sacral wound   . Tracheostomy status ([place])   . Debility 07/15/2017  . Chronic respiratory failure with hypoxia ([place]) 07/09/2017  . Abscess of groin, left   . Encounter for PEG (percutaneous endoscopic gastrostomy) ([place])   . Acute pulmonary edema ([facility])   . HCAP (healthcare-associated pneumonia)   . ARDS (adult respiratory distress syndrome) ([place])   . Pressure injury of skin 06/19/2017  . Difficult airway for intubation   . Respiratory distress 06/09/2017  . Respiratory failure with hypoxia and hypercapnia ([place]) 06/09/2017  . COPD with acute exacerbation ([place]) 06/17/2016  . Seasonal and perennial allergic rhinitis 05/24/2016  . AKI (acute kidney injury) ([place]) 04/08/2015  . Hypovolemic shock ([place]) 04/08/2015  . Sepsis ([place]) 04/08/2015  . Hyponatremia 04/08/2015  . HTN (hypertension) 03/28/2015  . Near syncope 03/28/2015  . Type 2 diabetes mellitus with hyperosmolar nonketotic hyperglycemia ([place]) 03/28/2015  . Hyperglycemia 03/27/2015  . Acute on chronic respiratory failure with hypoxemia ([place])  03/27/2015  . Acute on chronic congestive heart failure ([place])   . COPD exacerbation ([place]) 02/07/2015  . Pressure ulcer 12/28/2014  . COPD mixed type ([place]) 12/27/2014  . Acute respiratory failure ([place]) 12/27/2014  . Morbid obesity ([place]) 12/27/2014  . Diabetes mellitus type 2, controlled ([place]) 12/27/2014    Class: Chronic  . Obstructive sleep apnea 07/29/2013  . Peripheral edema 07/29/2013    Expected Discharge Date: Expected Discharge Date: (SNF)  Team Members Present: Physician leading conference: Dr. Alger Simons Social Worker Present: Lennart Pall, LCSW Nurse Present: Arelia Sneddon, RN PT Present: Canary Brim, PT OT Present: Napoleon Form, OT SLP Present: Weston Anna, SLP PPS Coordinator present : Daiva Nakayama, RN, CRRN     Current Status/Progress Goal Weekly Team Focus  Medical   po intake improved. anxious, will need trach long term  stabilize medically for next venue of care  see progress notes   Bowel/Bladder   continent of B/B LBM 07/08  remain continent of B/B maintain regular bowel pattern  assist with toileting needs laxatives prn   Swallow/Nutrition/ Hydration   Dys. 3 textures and thin liquids (per his preference), Mod I  Mod I  Goal Met   ADL's   Min A bathing/dressing, supervision sit<>stand, multiple rest breaks, fatigues quickly  Supervision/mod I overall; min A toileting  activity tolerance, functional transfers, standing balance/endurance, modified bathing/dressing   Mobility   min guard transfers, supervision bed mobility, gait 100' with RW  supervision overall (downgraded)  endurance, activity tolerance   Communication  Mod I  Mod I  Goal Met and D/C   Safety/Cognition/ Behavioral Observations            Pain   Pain managed with oxy 5-10 mg Q4H prn  pain <=3/10  assess pain q shift and prn   Skin   stage 2 trach foam dressing, stage 4 sacrum , stage 3 groin  healing wounds with no new skin issues  dressing changes as ordered encourage pt to T&P and  to stay off buttocks    Rehab Goals Patient on target to meet rehab goals: Yes *See Care Plan and progress notes for long and short-term goals.     Barriers to Discharge  Current Status/Progress Possible Resolutions Date Resolved   Physician    Medical stability        see progress notes      Nursing                  PT                    OT                  SLP                SW                Discharge Planning/Teaching Needs:  Plan has changed to SNF as family is unable to provide needed level of assistance.  Have encouraged pt to engage in any education for trach care he is offered while here on CIR as he has been very reluctant to do so.   Team Discussion:  Pt still with peg another 2 weeks.  Continues to be very anxious with trach and reluctant to learn trach care.  Wounds improving and with new dressing change orders.  Supervision to min assist overall with txs.  ST to d/c from service - tolerating PMSV.  SW reports SNF bed search underway.    Revisions to Treatment Plan:  Change in DC plan to SNF    Continued Need for Acute Rehabilitation Level of Care: The patient requires daily medical management by a physician with specialized training in physical medicine and rehabilitation for the following conditions: Daily direction of a multidisciplinary physical rehabilitation program to ensure safe treatment while eliciting the highest outcome that is of practical value to the patient.: Yes Daily medical management of patient stability for increased activity during participation in an intensive rehabilitation regime.: Yes Daily analysis of laboratory values and/or radiology reports with any subsequent need for medication adjustment of medical intervention for : Post surgical problems;Pulmonary problems;Diabetes problems  Timothy Le 07/28/2017, 1:34 PM

## 2017-07-28 NOTE — Progress Notes (Signed)
Occupational Therapy Discharge Summary  Patient Details  Name: Timothy Le MRN: [id number] Date of Birth: 1960-02-13  Patient has met 8 of 8 long term goals due to improved activity tolerance, improved balance, postural control, ability to compensate for deficits and improved coordination.  Patient to discharge at overall Supervision level.  Patient's family is unable to provide the necessary physical and medical assist pt will require at d/c. Therefore, pt to d/c to SNF in order to cont to increase independence with ADLs and increase medical stability.   Recommendation:  Patient will benefit from ongoing skilled OT services in skilled nursing facility setting to continue to advance functional skills in the area of BADL, iADL and Reduce care partner burden.  Equipment: To be determined at next venue of care  Reasons for discharge: treatment goals met and discharge from hospital  Patient/family agrees with progress made and goals achieved: Yes  OT Discharge Precautions/Restrictions  Precautions Precautions: Fall Precaution Comments: trach Restrictions Weight Bearing Restrictions: No ADL ADL ADL Comments: Please see functional navigator for ADL status Vision Baseline Vision/History: No visual deficits Patient Visual Report: No change from baseline Vision Assessment?: No apparent visual deficits Perception  Perception: Within Functional Limits Praxis Praxis: Intact Cognition Overall Cognitive Status: Within Functional Limits for tasks assessed Arousal/Alertness: Awake/alert Orientation Level: Oriented X4 Memory: Appears intact Awareness: Appears intact Problem Solving: Appears intact Safety/Judgment: Appears intact Sensation Sensation Light Touch: Appears Intact Coordination Gross Motor Movements are Fluid and Coordinated: No Coordination and Movement Description: Generalized weakness/ deconditioning; requires heavy reliance on UEs on RW during standing  tasks/mobility Motor  Motor Motor: Other (comment) Motor - Discharge Observations: Generalizaed weakness and deconditioning Trunk/Postural Assessment  Cervical Assessment Cervical Assessment: Within Functional Limits Thoracic Assessment Thoracic Assessment: Exceptions to WFL(Rounded shoulders; kyphotic) Lumbar Assessment Lumbar Assessment: Exceptions to WFL(Posterior pelvic tilt) Postural Control Postural Control: Deficits on evaluation  Balance Balance Balance Assessed: Yes Dynamic Sitting Balance Dynamic Sitting - Balance Support: During functional activity;Feet supported Dynamic Sitting - Level of Assistance: 6: Modified independent (Device/Increase time) Sitting balance - Comments: Sitting in w/c to complete LB dressing Static Standing Balance Static Standing - Balance Support: During functional activity;Right upper extremity supported;Left upper extremity supported Static Standing - Level of Assistance: 6: Modified independent (Device/Increase time) Static Standing - Comment/# of Minutes: Standing at [employer] Dynamic Standing Balance Dynamic Standing - Balance Support: During functional activity;Bilateral upper extremity supported Dynamic Standing - Level of Assistance: 6: Modified independent (Device/Increase time) Dynamic Standing - Comments: Standing with RW Extremity/Trunk Assessment RUE Assessment RUE Assessment: Within Functional Limits LUE Assessment LUE Assessment: Within Functional Limits   See Function Navigator for Current Functional Status.  Domani Bakos L 07/28/2017, 3:16 PM

## 2017-07-28 NOTE — Progress Notes (Signed)
Physical Therapy Session Note  Patient Details  Name: Timothy Le MRN: [id number] Date of Birth: 1960/01/26  Today's Date: 07/28/2017 PT Individual Time: 1100-1200 AND 1400-1430 AND 1630-1700  PT Individual Time Calculation (min): 60 min AND 30 min AND 30 min   Short Term Goals: Week 1:  PT Short Term Goal 1 (Week 1): = LTG PT Short Term Goal 1 - Progress (Week 1): Progressing toward goal Week 2:  PT Short Term Goal 1 (Week 2): = LTG due to ELOS  Skilled Therapeutic Interventions/Progress Updates:  Session 1.  Pt received sitting in WC and agreeable to PT  Gait in hall  x100f and through day room 2 x 557fwith RW and supervision assist from PT to manage O2 tank. Pt reports significant L knee pain on this day limiting increased distance with ambulation as well as increased SOB. SpO2 monitored throughout gait training with sats >97% on 3 L/min O 2 through trach collar.   UBE 3 min forward/3 min reverse with prolonged rest break between sets. Pt noted to have increased sputum following UBE requiring increased to clear airway with cough.   Blocked practice Sit<>stnad and standing tolerance to engage in corn hole bean bag toss with lateral reaches to the R  4 x 4 supervision assist for all sit<>stand transfers and standing tolerance with min cues intermittently for safety awareness.   Patient returned to room and left sitting EOB with call bell in reach and all needs met.     Session 2.  Pt received supine in bed and agreeable to PT. SLR x 6 BLE, hip abduction x 10 BLE, hip adduction to squeeze ball x 12, squat press through foot board x 10 heel slides 2 x 6 BLE. Scooting up in bed without assist from PT, and heavy use of rails. Pt left supine in bed with call bell in reach and all needs met.    Session 3.  Pt received supine in bed and agreeable to PT. Supine>sit transfer without assist or cues. RN present for Trach care at start of PT treatment. Following trach care by RN, PT  instructed pt in Grad day assessment (see d/c note for details.) Patient returned to room and left sitting EOB with call bell in reach and all needs met.         Therapy Documentation Precautions:  Precautions Precautions: Fall Precaution Comments: trach Restrictions Weight Bearing Restrictions: No Pain: Pain Assessment Pain Scale: 0-10 Pain Score: 6  Pain Type: Chronic pain Pain Location: Sacrum Pain Orientation: Posterior Pain Descriptors / Indicators: Throbbing Pain Frequency: Constant Pain Onset: On-going Pain Intervention(s): Medication (See eMAR);Repositioned   See Function Navigator for Current Functional Status.   Therapy/Group: Individual Therapy  AuLorie Phenix/10/2017, 12:08 PM

## 2017-07-28 NOTE — Discharge Summary (Signed)
NAME: Timothy Le, Timothy Le MEDICAL RECORD ZD:[id number] ACCOUNT [id number] DATE OF BIRTH:Dec 08, 1960 FACILITY: [facility] LOCATION: [facility] PHYSICIAN:ZACHARY SWARTZ, MD  DISCHARGE SUMMARY  DATE OF DISCHARGE:  07/28/2017  ADMIT DATE:  07/16/2017  DISCHARGE DATE:  07/29/2017   DISCHARGE DIAGNOSES: 1.  Debility secondary to ventilator-dependent respiratory failure.   2.  Subcutaneous Lovenox for deep venous thrombosis prophylaxis.   3.  Tracheostomy 06/17/2017.   4.  Gastrostomy tubes 07/05/2017.   5.  Diabetes mellitus, super obesity, acute on chronic anemia, hypertension.  A 57 year old right-handed male with history of asthma, COPD, noncompliant with CPAP, tobacco abuse, morbid obesity, hypertension, lives alone, used a walker prior to admission.  Presented 06/09/2017 with increasing shortness of breath, progressive  weight gain.  He did require intubation.  Chest x-ray showed low lung volumes with bibasilar atelectasis.  Troponin 0.06.  Urine drug screen negative.  Creatinine 1.27.  Critical care pulmonary services consulted difficulty weaning from ventilator.   Underwent tracheostomy 06/17/2017.  Changed to a #6 cuffless extra-long 07/09/2017.  No plans for decannulation until the patient has substantial weight loss.  Underwent gastrostomy tube placement for nutritional support 07/05/2017.  Diet slowly  advanced.  Subcutaneous Lovenox for DVT prophylaxis.  Wound care nurse followup for sacral wound with dressing care as directed.  The patient was admitted for a comprehensive rehabilitation program.  PAST MEDICAL HISTORY:  See discharge diagnoses.  SOCIAL HISTORY:  Lives alone.  Used a walker prior to admission.  FUNCTIONAL STATUS:  Upon admission to rehab services, moderate assist to ambulate 5 feet rolling walker, moderate assist sit to stand, max assist activities of daily living.  PHYSICAL EXAMINATION:   VITAL SIGNS:  Blood pressure 135/79, pulse 93, temperature 98, respirations  22. GENERAL:  Alert male in no acute distress.   HEENT:  Tracheostomy tube in place.  Reasonable phonation with PMV valve in place.  EOMs intact.   CARDIAC:  Rate controlled. ABDOMEN:  Soft, nontender, good bowel sounds. LUNGS:  Clear to auscultation without wheeze.  Gastrostomy tube in place.  REHABILITATION HOSPITAL COURSE:  The patient was admitted to inpatient rehabilitation services.  Therapies initiated on a 3-hour daily basis, consisting of physical therapy, occupational therapy, speech therapy and rehabilitation nursing.  The following  issues were addressed during patient's rehabilitation stay:  Pertaining to the patient's debility related to VDRF, he would follow pulmonary services.  Tracheostomy tube remained in place, extra-long cuffless 07/09/2017.  He would follow up outpatient  tracheostomy clinic pulmonary services.  No plan for decannulation until the patient has substantial weight loss.  He continued on subcutaneous Lovenox for DVT prophylaxis.  His diet was slowly advanced to a mechanical soft.  He would need PEG tube in  place for approximately 6 weeks prior to removal, placed 07/05/2017 by interventional radiology.  Blood sugars overall controlled.  He remained on Lantus insulin.  Super obesity, BMI of 70.21.  Follow up per dietary services.  Acute on chronic anemia  9.4.  He remained asymptomatic.  He exhibited no other signs of fluid overload.  He remained on Demadex.  Close monitoring of a sacral wound with a piece of Aquacel hydrofiber placed.  Change daily.  Pack sacral wound with silver hydrofiber, cover with  dry dressing, change daily.  The patient received weekly collaborative interdisciplinary team conferences.  He was ambulating with an assistive device up to 20 feet, increasing to 65 feet.  Energy conservation techniques.  Needed some encouragement at  times to participate.  Transfers, sitting at edge of  bed with supervision.  Gathered belongings for activities of daily  living and homemaking.  Completed functional ambulation to the bathroom using rolling walker.  Due to limited assistance at home, it  was felt skilled nursing facility was needed with bed becoming available 07/29/2017.  Discharge medications included Lipitor 10 mg p.o. daily, Colace 100 mg p.o. b.i.d., Lantus insulin 10 units at bedtime, multivitamin 1 tablet p.o. daily, potassium chloride 20 mEq p.o. daily, Senokot-S 2 tablets p.o. at bedtime, Demadex 10 mg p.o. daily,  vitamin C 250 mg p.o. daily.  His diet was mechanical soft, thin liquids.  Wound care:  Apply a piece of Aquacel hydrofiber to the left perineal scrotal fold.  Change daily.  Pack sacral wound with silver hydrofiber, cover with dry dressing, change  daily.  The patient to followup with Dr. DR Alger Simons no follow up needed, Dr. Marybelle Killings for removal of gastrostomy tube when needed, placed 07/05/2017, [facility] Dom, tracheostomy Clinic pulmonary services.  SPECIAL INSTRUCTIONS:  Do not remove tracheostomy tube until followup per pulmonary services.  GN/NUANCE D:07/28/2017 T:07/28/2017 JOB:[id number]

## 2017-07-28 NOTE — Progress Notes (Signed)
Occupational Therapy Session Note  Patient Details  Name: Timothy Le MRN: [id number] Date of Birth: 1960-08-24  Today's Date: 07/28/2017 OT Individual Time: 5631-4970 OT Individual Time Calculation (min): 60 min    Short Term Goals: Week 2:  OT Short Term Goal 1 (Week 2): STG=LTG due to LOS  Skilled Therapeutic Interventions/Progress Updates:    Pt seen for OT session focusing on functional mobility, problem solving for pressure relief and functional activity tolerance. Pt received sitting on toilet upon arrival with hand off from nursing. Pt completed functional transfers throughout session with supervision using RW. Required total A for hygiene for thoroughness following BM. Pt able to manage clothing. He ambulated out of bathroom with assist to manage O2 tank. Pt's ROHO cushion removed from room yesterday due to leak. Pt now sitting on standard pillow. Therapist retrieved 18x18 ROHO, barely able to accommodate pt's hip width. Also instructed pt in standing pressure relief, educated to complete for 30 seconds every 30 minutes. Pt demonstrated ability to complete standing pressure relief and manage w/c parts for safety mod I. Informed RN of plan. Pt declined needing time for aid in remembering to complete.  While therapist retrieved w/c cushion and equipment pt completed UE strengthening exercises using 5# dowel rod, recalling exercises taught in previous sessions.  Pt then completed functional ambulation throughout unit with RW and supervision, assist to manage O2 tank. Pt with heavy reliance on UEs on RW. Seated rest break following ~82ft. Pt able to complete sit>stand from standard chair without armrests. Pt returned to room in same manner as described above, pt left seated in w/c at end of session, all needs in reach and reviewed schedule for standing pressure relief.    Therapy Documentation Precautions:  Precautions Precautions: Fall Precaution Comments:  trach Restrictions Weight Bearing Restrictions: No Pain: Pain Assessment Pain Scale: 0-10 Pain Score: 6  Pain Type: Chronic pain Pain Location: Sacrum Pain Orientation: Posterior Pain Descriptors / Indicators: Throbbing Pain Frequency: Constant Pain Onset: On-going Pain Intervention(s): Medication (See eMAR);Repositioned, RN aware, increased activity ADL: ADL ADL Comments: Please see functional navigator for ADL status  See Function Navigator for Current Functional Status.   Therapy/Group: Individual Therapy  Timothy Le L 07/28/2017, 6:46 AM

## 2017-07-28 NOTE — Progress Notes (Signed)
Social Work Patient ID: Timothy Le, male   DOB: 12-09-1960, 57 y.o.   MRN: [id number]  Have received SNF offer from [facility] [place].  Discussed with pt and sister.  They are disappointed that a local facility could not offer, however they have accepted this bed and planning for transfer tomorrow.  Tx team aware.  Matteo Banke, LCSW

## 2017-07-28 NOTE — Progress Notes (Signed)
[place] PHYSICAL MEDICINE & REHABILITATION     PROGRESS NOTE    Subjective/Complaints: Up at [employer] eating breakfast. Had a spell with his breathing last night which was better after suctioning. Belly feels much better  ROS: Patient denies fever, rash, sore throat, blurred vision, nausea, vomiting, diarrhea, cough,    chest pain, joint or back pain, headache, or mood change.    Objective:  No results found. No results for input(s): WBC, HGB, HCT, PLT in the last 72 hours. No results for input(s): NA, K, CL, GLUCOSE, BUN, CREATININE, CALCIUM in the last 72 hours.  Invalid input(s): CO CBG (last 3)  Recent Labs    07/27/17 1647 07/27/17 2058 07/28/17 0627  GLUCAP 105* 115* 99    Wt Readings from Last 3 Encounters:  07/28/17 (!) 145.2 kg (320 lb 1.7 oz)  07/15/17 (!) 172.4 kg (380 lb)  01/20/17 (!) 166 kg (366 lb)    Intake/Output Summary (Last 24 hours) at 07/28/2017 0852 Last data filed at 07/28/2017 0748 Gross per 24 hour  Intake 480 ml  Output 1900 ml  Net -1420 ml    Vital Signs: Blood pressure 132/86, pulse 86, temperature 98 F (36.7 C), temperature source Oral, resp. rate 16, height 5\' 6"  (1.676 m), weight (!) 145.2 kg (320 lb 1.7 oz), SpO2 98 %. Physical Exam:  Constitutional: No distress . Vital signs reviewed. HEENT: EOMI, oral membranes moist,  Neck: supple, trach pmv, voice stronger Cardiovascular: RRR   Respiratory: decreased air movement. Occasional rhonchi    GI: BS +, minimally-tender, non-distended  Musc: lower extremity edema. Neurological: He is alert. Motor: 4/5 proximal to distal throughout (stable) Skin:Sacral decub closing Psychiatric: anxious  Assessment/Plan: 1. Functional deficits secondary to debility after VDRF which require 3+ hours per day of interdisciplinary therapy in a comprehensive inpatient rehab setting. Physiatrist is providing close team supervision and 24 hour management of active medical problems listed  below. Physiatrist and rehab team continue to assess barriers to discharge/monitor patient progress toward functional and medical goals.  Function:  Bathing Bathing position   Position: Wheelchair/chair at sink  Bathing parts Body parts bathed by patient: Right arm, Left arm, Chest, Abdomen, Front perineal area, Buttocks, Right upper leg, Left upper leg, Right lower leg, Left lower leg Body parts bathed by helper: Right lower leg, Left lower leg, Back  Bathing assist Assist Level: Supervision or verbal cues(LH sponge)      Upper Body Dressing/Undressing Upper body dressing   What is the patient wearing?: Pull over shirt/dress     Pull over shirt/dress - Perfomed by patient: Thread/unthread right sleeve, Thread/unthread left sleeve, Put head through opening, Pull shirt over trunk          Upper body assist Assist Level: Set up   Set up : To obtain clothing/put away  Lower Body Dressing/Undressing Lower body dressing   What is the patient wearing?: Pants, Underwear, Shoes Underwear - Performed by patient: Thread/unthread right underwear leg, Thread/unthread left underwear leg Underwear - Performed by helper: Pull underwear up/down Pants- Performed by patient: Thread/unthread right pants leg, Thread/unthread left pants leg Pants- Performed by helper: Pull pants up/down Non-skid slipper socks- Performed by patient: Don/doff right sock, Don/doff left sock(Sock aid) Non-skid slipper socks- Performed by helper: Don/doff right sock, Don/doff left sock     Shoes - Performed by patient: Don/doff right shoe, Don/doff left shoe(elastic shoe laces and shoe funnel) Shoes - Performed by helper: Don/doff right shoe, Don/doff left shoe, Fasten left, Fasten right  Lower body assist Assist for lower body dressing: Touching or steadying assistance (Pt > 75%)      Toileting Toileting Toileting activity did not occur: N/A Toileting steps completed by patient: Adjust clothing prior  to toileting Toileting steps completed by helper: Performs perineal hygiene, Adjust clothing after toileting Toileting Assistive Devices: Toilet aid, Grab bar or rail  Toileting assist Assist level: Supervision or verbal cues   Transfers Chair/bed transfer   Chair/bed transfer method: Stand pivot, Ambulatory Chair/bed transfer assist level: Supervision or verbal cues Chair/bed transfer assistive device: Armrests     Locomotion Ambulation     Max distance: 25 ft Assist level: Touching or steadying assistance (Pt > 75%)   Wheelchair          Cognition Comprehension Comprehension assist level: Follows basic conversation/direction with extra time/assistive device  Expression Expression assist level: Expresses basic 75 - 89% of the time/requires cueing 10 - 24% of the time. Needs helper to occlude trach/needs to repeat words.  Social Interaction Social Interaction assist level: Interacts appropriately 90% of the time - Needs monitoring or encouragement for participation or interaction.  Problem Solving Problem solving assist level: Solves basic 90% of the time/requires cueing < 10% of the time  Memory Memory assist level: Recognizes or recalls 90% of the time/requires cueing < 10% of the time   Medical Problem List and Plan: 1.Debilitysecondary to VDRF/multi-medicalissues  Cont CIR, now SNF pending 2. DVT Prophylaxis/Anticoagulation: Subcutaneous Lovenox. Monitor for any bleeding episodes 3. Pain Management:Tylenol as needed 4. Mood:Provide emotional support 5. Neuropsych: This patientiscapable of making decisions on hisown behalf. 6. Skin/Wound Care/sacrococcyx decubitus:area clean  continue packing of sacral wound, local care, air mattress  appreciate WOC RN follow up  Doxycycline completed on 7/1 7. Fluids/Electrolytes/Nutrition:good po intake so far  BMP within acceptable range on 7/5  D3 thins currently   8.Tracheostomy 06/17/2017 per Dr. Cleaster Corin to  cuffless XLT #6 07/09/2017.    Speech therapy follow-up   -continue PMV.  -NO PLAN FOR DECANNULATION UNTIL WEIGHT LOSS 9.Gastrostomy tube 07/05/2017. Diet advanced to mechanical soft thin liquids.  - IPEG needs to be in about 6 weeks prior to removal  -I loosened flange of PEG which has helped with abdominal pain 10.Diabetes mellitus. Hemoglobin A1c 5.4. Lantus insulin 10 units nightly.   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 Follow-up dietary  Continue to educate 12.Acute on chronic anemia.   Hb 9.4 on 7/8 13.Hypertension. Demadex 10 mg daily.   controlled on 7/10 14. HLD  Lipitor started on 7/1 15. Transaminitis: Resolved     LOS (Days) [place] EVALUATION WAS PERFORMED  Meredith Staggers, MD 07/28/2017 8:52 AM

## 2017-07-28 NOTE — Discharge Summary (Signed)
Discharge summary job [id number]

## 2017-07-28 NOTE — Progress Notes (Signed)
Patient is unavailable for trach assessment at this time. Patient is out of the room . Will attempt assessment at a later time.

## 2017-07-29 ENCOUNTER — Inpatient Hospital Stay (HOSPITAL_COMMUNITY): Payer: Medicaid Other | Admitting: Occupational Therapy

## 2017-07-29 LAB — GLUCOSE, CAPILLARY
Glucose-Capillary: 98 mg/dL (ref 70–99)
Glucose-Capillary: 116 mg/dL — ABNORMAL HIGH (ref 70–99)

## 2017-07-29 NOTE — Progress Notes (Signed)
Social Work  Discharge Note  The overall goal for the admission was met for:   Discharge location: No - plan changed to SNF as family cannot provide needed support   Length of Stay: Yes - 14 days  Discharge activity level: Yes - supervision to min assist overall  Home/community participation: NO - NHP  Services provided included: MD, RD, PT, OT, SLP, RN, TR, Pharmacy, Neuropsych and SW  Financial Services: Medicaid  Follow-up services arranged: Other: SNF at [facility] [place]  Comments (or additional information):  Patient/Family verbalized understanding of follow-up arrangements: Yes  Individual responsible for coordination of the follow-up plan: pt  Confirmed correct DME delivered: NA    ,  

## 2017-07-29 NOTE — Progress Notes (Addendum)
[place] PHYSICAL MEDICINE & REHABILITATION     PROGRESS NOTE    Subjective/Complaints: Lying in bed. No new complaints. Stomach feels much better.   ROS: Patient denies fever, rash, sore throat, blurred vision, nausea, vomiting, diarrhea, cough, shortness of breath or chest pain, joint or back pain, headache, or mood change.    Objective:  No results found. No results for input(s): WBC, HGB, HCT, PLT in the last 72 hours. No results for input(s): NA, K, CL, GLUCOSE, BUN, CREATININE, CALCIUM in the last 72 hours.  Invalid input(s): CO CBG (last 3)  Recent Labs    07/28/17 1720 07/28/17 2117 07/29/17 0645  GLUCAP 104* 120* 98    Wt Readings from Last 3 Encounters:  07/29/17 (!) 145.3 kg (320 lb 5.3 oz)  07/15/17 (!) 172.4 kg (380 lb)  01/20/17 (!) 166 kg (366 lb)    Intake/Output Summary (Last 24 hours) at 07/29/2017 0910 Last data filed at 07/29/2017 0223 Gross per 24 hour  Intake 480 ml  Output 900 ml  Net -420 ml    Vital Signs: Blood pressure (!) 115/58, pulse 65, temperature 99 F (37.2 C), temperature source Oral, resp. rate 19, height 5\' 6"  (1.676 m), weight (!) 145.3 kg (320 lb 5.3 oz), SpO2 98 %. Physical Exam:  Constitutional: No distress . Vital signs reviewed. obese HEENT: EOMI, oral membranes moist Neck: supple, trach Cardiovascular: RRR without murmur. No JVD    Respiratory: CTA Bilaterally without wheezes or rales. Normal effort    GI: BS +, non-tender, non-distended, PEG  Musc: lower extremity edema. Neurological: He is alert. Motor: 4/5 proximal to distal throughout  Skin:Sacral decub closing Psychiatric: anxious  Assessment/Plan: 1. Functional deficits secondary to debility after VDRF which require 3+ hours per day of interdisciplinary therapy in a comprehensive inpatient rehab setting. Physiatrist is providing close team supervision and 24 hour management of active medical problems listed below. Physiatrist and rehab team continue to  assess barriers to discharge/monitor patient progress toward functional and medical goals.  Function:  Bathing Bathing position   Position: Wheelchair/chair at sink  Bathing parts Body parts bathed by patient: Right arm, Left arm, Chest, Abdomen, Front perineal area, Buttocks, Right upper leg, Left upper leg, Right lower leg, Left lower leg Body parts bathed by helper: Back  Bathing assist Assist Level: Supervision or verbal cues(LH sponge)      Upper Body Dressing/Undressing Upper body dressing   What is the patient wearing?: Pull over shirt/dress     Pull over shirt/dress - Perfomed by patient: Thread/unthread right sleeve, Thread/unthread left sleeve, Put head through opening, Pull shirt over trunk          Upper body assist Assist Level: Set up   Set up : To obtain clothing/put away  Lower Body Dressing/Undressing Lower body dressing   What is the patient wearing?: Pants, Underwear, Shoes Underwear - Performed by patient: Thread/unthread right underwear leg, Thread/unthread left underwear leg, Pull underwear up/down Underwear - Performed by helper: Pull underwear up/down Pants- Performed by patient: Thread/unthread right pants leg, Thread/unthread left pants leg, Pull pants up/down Pants- Performed by helper: Pull pants up/down Non-skid slipper socks- Performed by patient: Don/doff right sock, Don/doff left sock(Sock aid) Non-skid slipper socks- Performed by helper: Don/doff right sock, Don/doff left sock     Shoes - Performed by patient: Don/doff right shoe, Don/doff left shoe(Elastic shoelaces and shoe funnel) Shoes - Performed by helper: Don/doff right shoe, Don/doff left shoe, Fasten left, Fasten right  Lower body assist Assist for lower body dressing: Supervision or verbal cues      Toileting Toileting Toileting activity did not occur: N/A Toileting steps completed by patient: Adjust clothing prior to toileting, Performs perineal hygiene, Adjust  clothing after toileting Toileting steps completed by helper: Performs perineal hygiene, Adjust clothing after toileting Toileting Assistive Devices: Other (comment)(RW)  Toileting assist Assist level: Supervision or verbal cues   Transfers Chair/bed transfer   Chair/bed transfer method: Stand pivot Chair/bed transfer assist level: No Help, no cues, assistive device, takes more than a reasonable amount of time Chair/bed transfer assistive device: Armrests     Locomotion Ambulation     Max distance: 65 Assist level: Supervision or verbal cues   Wheelchair Wheelchair activity did not occur: N/A        Cognition Comprehension Comprehension assist level: Follows complex conversation/direction with extra time/assistive device  Expression Expression assist level: Expresses basic needs/ideas: With no assist  Social Interaction Social Interaction assist level: Interacts appropriately 90% of the time - Needs monitoring or encouragement for participation or interaction.  Problem Solving Problem solving assist level: Solves basic problems with no assist  Memory Memory assist level: Recognizes or recalls 90% of the time/requires cueing < 10% of the time   Medical Problem List and Plan: 1.Debilitysecondary to VDRF/multi-medicalissues  Cont CIR, for SNF today  -I DO NOT need to see him back in follow up  -he DOES need to see pulmonary and primary once d'ced home from SNF 2. DVT Prophylaxis/Anticoagulation: Subcutaneous Lovenox. Monitor for any bleeding episodes 3. Pain Management:Tylenol as needed 4. Mood:Provide emotional support 5. Neuropsych: This patientiscapable of making decisions on hisown behalf. 6. Skin/Wound Care/sacrococcyx decubitus:area clean  continue packing of sacral wound, local care, air mattress  appreciate WOC RN follow up  Doxycycline completed on 7/1 7. Fluids/Electrolytes/Nutrition:good po intake so far  BMP within acceptable range on 7/5  D3 thins  currently   8.Tracheostomy 06/17/2017 per Dr. Cleaster Corin to cuffless XLT #6 07/09/2017.    Speech therapy follow-up   -continue PMV.  -NO PLAN FOR DECANNULATION UNTIL WEIGHT LOSS 9.Gastrostomy tube 07/05/2017. Diet advanced to mechanical soft thin liquids.  - IPEG needs to be in about 6 weeks prior to removal  -I loosened flange of PEG which has helped with abdominal pain 10.Diabetes mellitus. Hemoglobin A1c 5.4. Lantus insulin 10 units nightly.   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 Follow-up dietary  Continue to educate 12.Acute on chronic anemia.   Hb 9.4 on 7/8 13.Hypertension. Demadex 10 mg daily.   controlled on 7/10 14. HLD  Lipitor started on 7/1 15. Transaminitis: Resolved     LOS (Days) [place] EVALUATION WAS PERFORMED  Meredith Staggers, MD 07/29/2017 9:10 AM

## 2017-07-29 NOTE — Progress Notes (Signed)
Physical Therapy Discharge Summary  Patient Details  Name: Timothy Le MRN: [id number] Date of Birth: 02/28/1960  Today's Date: 07/28/2017     Patient has met 5 of 5 long term goals due to improved activity tolerance, improved balance, increased strength, decreased pain and improved awareness.  Patient to discharge at an ambulatory level Supervision.   Patient's care partner unavailable to provide the necessary physical assistance at discharge.  Reasons goals not met: All PT goals met.    Recommendation:  Patient will benefit from ongoing skilled PT services in skilled nursing facility setting to continue to advance safe functional mobility, address ongoing impairments in balance, safety, endurance, pain, safety, and minimize fall risk.  Equipment: No equipment provided  Reasons for discharge: treatment goals met and discharge from hospital  Patient/family agrees with progress made and goals achieved: Yes  PT Discharge Precautions/Restrictions   Vital Signs Therapy Vitals Temp: 99 F (37.2 C) Temp Source: Oral Pulse Rate: 65 Resp: 19 BP: (!) 115/58 Patient Position (if appropriate): Lying Oxygen Therapy SpO2: 100 % O2 Device: Tracheostomy Collar O2 Flow Rate (L/min): 5 L/min FiO2 (%): 28 % Pain   6/10 low back AND L knee  Vision/Perception    WFL Cognition Overall Cognitive Status: Within Functional Limits for tasks assessed Arousal/Alertness: Awake/alert Orientation Level: Oriented X4 Memory: Appears intact Awareness: Appears intact Problem Solving: Appears intact Safety/Judgment: Appears intact Sensation Sensation Light Touch: Appears Intact Additional Comments: reports intermittent N/T in soles of BLE  Coordination Gross Motor Movements are Fluid and Coordinated: Yes Fine Motor Movements are Fluid and Coordinated: Yes Motor  Motor Motor: Other (comment) Motor - Discharge Observations: Generalizaed cardiovascular deconditioning  Mobility Bed  Mobility Bed Mobility: Rolling Right;Rolling Left;Supine to Sit;Sit to Supine Rolling Right: Independent with assistive device Rolling Left: Independent with assistive device Supine to Sit: Independent with assistive device(heavy use of rails ) Sit to Supine: Independent with assistive device(heavy use of rails ) Transfers Transfers: Sit to Stand;Stand Pivot Transfers Sit to Stand: Independent with assistive device Stand Pivot Transfers: Independent with assistive device Stand Pivot Transfer Details (indicate cue type and reason): heavy use of BUE on RW for stability.  Transfer (Assistive device): Manufacturing systems engineer Ambulation: Yes Gait Assistance: Supervision/Verbal cueing Gait Distance (Feet): 65 Feet Assistive device: Rolling walker Gait Assistance Details: Verbal cues for safe use of DME/AE;Verbal cues for gait pattern Gait Gait: Yes Gait Pattern: Wide base of support;Trunk flexed Stairs / Additional Locomotion Stairs: Yes Stairs Assistance: Minimal Assistance - Patient > 75% Stair Management Technique: Two rails Number of Stairs: 1 Height of Stairs: 6 Wheelchair Mobility Wheelchair Mobility: No  Trunk/Postural Assessment  Cervical Assessment Cervical Assessment: Within Functional Limits Thoracic Assessment Thoracic Assessment: Exceptions to WFL(Rounded shoulders; kyphotic) Lumbar Assessment Lumbar Assessment: Exceptions to WFL(Posterior pelvic tilt) Postural Control Postural Control: Within Functional Limits  Balance Balance Balance Assessed: Yes Dynamic Sitting Balance Dynamic Sitting - Balance Support: During functional activity;Feet supported Dynamic Sitting - Level of Assistance: 6: Modified independent (Device/Increase time) Sitting balance - Comments: Sitting in w/c to complete LB dressing Static Standing Balance Static Standing - Balance Support: During functional activity;Right upper extremity supported;Left upper extremity supported Static  Standing - Level of Assistance: 6: Modified independent (Device/Increase time) Dynamic Standing Balance Dynamic Standing - Balance Support: During functional activity;Bilateral upper extremity supported Dynamic Standing - Level of Assistance: 6: Modified independent (Device/Increase time) Extremity Assessment      RLE Assessment General Strength Comments: grossly 4+5/5 LLE Assessment General Strength Comments:  grossly 4/5 with pain in knee with MMT    See Function Navigator for Current Functional Status.  Lorie Phenix 07/29/2017, 7:50 AM

## 2017-07-29 NOTE — Plan of Care (Signed)
  Problem: Consults Goal: RH GENERAL PATIENT EDUCATION Description See Patient Education module for education specifics. Outcome: Progressing Note:  POC and wound care discussed with pt.Marland Kitchen

## 2017-07-29 NOTE — Plan of Care (Signed)
  Problem: Consults Goal: RH GENERAL PATIENT EDUCATION Description See Patient Education module for education specifics. Outcome: Completed/Met Goal: Skin Care Protocol Initiated - if Braden Score 18 or less Description If consults are not indicated, leave blank or document N/A Outcome: Completed/Met Goal: Nutrition Consult-if indicated Outcome: Completed/Met Goal: Diabetes Guidelines if Diabetic/Glucose > 140 Description If diabetic or lab glucose is > 140 mg/dl - Initiate Diabetes/Hyperglycemia Guidelines & Document Interventions  Outcome: Completed/Met   Problem: RH BOWEL ELIMINATION Goal: RH STG MANAGE BOWEL WITH ASSISTANCE Description STG Manage Bowel with min Assistance.  Outcome: Completed/Met Goal: RH STG MANAGE BOWEL W/MEDICATION W/ASSISTANCE Description STG Manage Bowel with Medication with min Assistance.  Outcome: Completed/Met   Problem: RH BLADDER ELIMINATION Goal: RH STG MANAGE BLADDER WITH ASSISTANCE Description STG Manage Bladder With min Assistance  Outcome: Completed/Met Goal: RH STG MANAGE BLADDER WITH MEDICATION WITH ASSISTANCE Description STG Manage Bladder With Medication With min Assistance.  Outcome: Completed/Met   Problem: RH SKIN INTEGRITY Goal: RH STG SKIN FREE OF INFECTION/BREAKDOWN Description Healing wound and no new skin breakdown with mod assistance  Outcome: Completed/Met Goal: RH STG MAINTAIN SKIN INTEGRITY WITH ASSISTANCE Description STG Maintain Skin Integrity With mod Assistance.  Outcome: Completed/Met Goal: RH STG ABLE TO PERFORM INCISION/WOUND CARE W/ASSISTANCE Description STG Able To Perform Incision/Wound Care With max Assistance.  Outcome: Completed/Met   Problem: RH SAFETY Goal: RH STG ADHERE TO SAFETY PRECAUTIONS W/ASSISTANCE/DEVICE Description STG Adhere to Safety Precautions With min Assistance/Device.  Outcome: Completed/Met Goal: RH STG DECREASED RISK OF FALL WITH ASSISTANCE Description STG Decreased Risk of  Fall With min Assistance.  Outcome: Completed/Met Goal: RH STG DEMO UNDERSTANDING HOME SAFETY PRECAUTIONS Description Patient able to verbalize home safety precaution with min assistance  Outcome: Completed/Met   Problem: RH PAIN MANAGEMENT Goal: RH STG PAIN MANAGED AT OR BELOW PT'S PAIN GOAL Description Pain < or = 5 with min assistance  Outcome: Completed/Met   Problem: RH KNOWLEDGE DEFICIT GENERAL Goal: RH STG INCREASE KNOWLEDGE OF SELF CARE AFTER HOSPITALIZATION Description Patient able to verbalize self care with min assistance  Outcome: Completed/Met

## 2017-07-29 NOTE — Progress Notes (Signed)
Report called to Butch Penny, RN at the [facility].  All questions answered to her satisfaction.  Brita Romp, RN

## 2017-09-01 ENCOUNTER — Inpatient Hospital Stay (HOSPITAL_COMMUNITY): Admit: 2017-09-01 | Payer: Medicaid Other

## 2017-10-04 ENCOUNTER — Ambulatory Visit (INDEPENDENT_AMBULATORY_CARE_PROVIDER_SITE_OTHER): Payer: Medicaid Other | Admitting: Otolaryngology

## 2017-10-04 DIAGNOSIS — G4733 Obstructive sleep apnea (adult) (pediatric): Secondary | ICD-10-CM | POA: Diagnosis not present

## 2017-10-04 DIAGNOSIS — Z930 Tracheostomy status: Secondary | ICD-10-CM | POA: Diagnosis not present

## 2017-11-26 ENCOUNTER — Ambulatory Visit: Payer: Medicaid Other | Admitting: Internal Medicine

## 2017-11-26 ENCOUNTER — Encounter: Payer: Self-pay | Admitting: Internal Medicine

## 2017-11-26 VITALS — BP 134/88 | HR 100 | Ht 66.0 in | Wt 318.0 lb

## 2017-11-26 DIAGNOSIS — Z430 Encounter for attention to tracheostomy: Secondary | ICD-10-CM

## 2017-11-26 DIAGNOSIS — J449 Chronic obstructive pulmonary disease, unspecified: Secondary | ICD-10-CM

## 2017-11-26 DIAGNOSIS — J9611 Chronic respiratory failure with hypoxia: Secondary | ICD-10-CM | POA: Diagnosis not present

## 2017-11-26 DIAGNOSIS — Z23 Encounter for immunization: Secondary | ICD-10-CM | POA: Diagnosis not present

## 2017-11-26 DIAGNOSIS — G4733 Obstructive sleep apnea (adult) (pediatric): Secondary | ICD-10-CM

## 2017-11-26 NOTE — Assessment & Plan Note (Signed)
He likely also has hypercapnia.  His tracheostomy tube bypasses his obstructive sleep apnea physiology for now, but the obesity hypoventilation pattern persists.  We will not be able to get him back on CPAP until the stoma could be occluded. Plan-ABG on room air with Passy-Muir valve in his tracheostomy.  Refer to tracheostomy clinic to establish for help with follow-up and eventual extubation when ready.

## 2017-11-26 NOTE — Assessment & Plan Note (Signed)
We will retest obstructive component as he loses weight.

## 2017-11-26 NOTE — Progress Notes (Signed)
HPI  M former smoker followed for COPD, chronic hypoxic respiratory failure OSA, OHS, complicated by morbid obesity, DM2, CHF, anemia PFT 09/21/13-severe obstruction with slight response to bronchodilator, restriction of exhaled volume, mild reduction diffusion NPSG 08/08/13- AHI 21.1/ hr, desaturation to 80%, body weight 385 lbs BIPAP titration 09/05/15 optimal pressure 22/18, body weight 365 lbs --------------------------------------------------------  05/21/16- 57 year old male former smoker followed for COPD, hypoxic respiratory failure, OSA, complicated by morbid obesity, OHS, DM, CHF BiPAP 22/18/ PS 4 / [facility]   O2 2 L continuous     Requalified today for O2  Not using BIPAP- DL shows no activity. follow up dme [facility]PS  at time wears his oxygen all the time at [facility]  He admits not taking medicines regularly, gaining weight again, not using BiPAP. Irregular, on discipline sleep schedule. Complains pollen is causing itching and sneezing.  11/26/2017- 57 year old male former smoker followed for COPD, hypoxic respiratory failure/ tracheostomy, OSA, complicated by morbid obesity, OHS, DM, CHF Now under the good primary care of Juluis Mire, NP Post hospital-discharge from rehab 07/28/2017-ventilator dependent respiratory failure, tracheostomy on 06/17/2017, gastrostomy tube 07/05/2017 with additional diagnoses DM, morbid obesity, acute on chronic anemia, hypertension. Change to #6, cuffless, extra long tracheostomy on 07/09/2017.  "No plans for decannulation until the patient has substantial weight loss".  Wound care nurse follow-up for sacral wound.  He had been noncompliant with CPAP prior to admission.  Contact had been made with [facility], tracheostomy clinic. He has been home about 5 weeks now, gastrostomy tube out.  He reports walking regularly and being much more strict about his diet.  Sacral wound has healed.  Getting regular home visits from home health nurse and rehab nurse.  He is up-to-date  on pneumonia vaccine but needs flu shot. He is eating regular food and denies choking or strangling with food or drink. CXR 06/24/2017- Tracheostomy tube and feeding catheter are again seen and stable. Stable cardiomegaly is noted. Lungs are well aerated bilaterally. Improved aeration in the right base is noted. Persistent opacity in the left base with consolidation and effusion is seen and stable. No new focal abnormality is noted. IMPRESSION: Stable changes in the left base -----1 yr f/u for OSA. Since his visit, he was diagnosed with CHF and had a trach placed.  Body weight today 318 pounds  ROS-see HPI    + = positive Constitutional:    weight loss, night sweats, fevers, chills, + fatigue, lassitude. HEENT:   No-  headaches, difficulty swallowing, tooth/dental problems, sore throat,     + sneezing, itching, ear ache, nasal congestion, post nasal drip,  CV:  No-   chest pain, +orthopnea, PND, swelling in lower extremities, no-anasarca, dizziness, palpitations Resp: +shortness of breath with exertion or at rest.                productive cough,  + non-productive cough,  No- coughing up of blood.              No-   change in color of mucus.   wheezing.   Skin: No-   rash or lesions. GI:  No-   heartburn, indigestion, abdominal pain,no- nausea, vomiting,  GU:  MS:  + joint pain or swelling.  . Neuro-     nothing unusual Psych:  No- change in mood or affect. No depression or anxiety.  No memory loss.  OBJ- Physical Exam General- Alert, Oriented, Affect-appropriate, Distress- none acute,  + morbidly obese,                      +  So fat that he has to sprawl back in chair to unload abd from diaphragm so he can breathe. Skin- rash-none, lesions- none, excoriation- none Lymphadenopathy- none Head- atraumatic            Eyes- Gross vision intact, PERRLA, conjunctivae and secretions clear            Ears- Hearing, canals-normal            Nose- Clear, no-Septal dev, mucus, polyps, erosion,  perforation             Throat- Mallampati IV , mucosa clear , drainage- none, tonsils- atrophic, + somewhat stridorous respiration                    + Shallow consistent with body habitus, normal voice quality Neck- + #6 tracheostomy tube with Passy-Muir valve.  Speech is clear and breathing unlabored at rest on room air. Chest - symmetrical excursion , unlabored           Heart/CV- RRR, distant , no murmur , no gallop  , no rub, nl s1 s2                           - JVD- none , edema-none, stasis changes- none, varices- none           Lung- diminished/clear +, wheeze- none, cough-none , dullness-none, rub- none                                   Chest wall-  Abd-  Br/ Gen/ Rectal- Not done, not indicated Extrem- cyanosis- none, clubbing, none, atrophy- none, strength- nl Neuro- grossly intact to observation

## 2017-11-26 NOTE — Assessment & Plan Note (Addendum)
Importance of weight loss cannot be overstated.  We anticipate resuming CPAP coverage when he can be extubated.  For now, his tracheostomy bypasses his OSA airway problems. He is distinctly more alert and interactive than at our last visit.

## 2017-11-26 NOTE — Patient Instructions (Signed)
Order- ABG on room air with tracheostomy    Dx chronic respiratory failure  Order- referral to [facility]-  Help with management and eventual weaning

## 2017-12-02 ENCOUNTER — Ambulatory Visit (HOSPITAL_COMMUNITY)
Admission: RE | Admit: 2017-12-02 | Discharge: 2017-12-02 | Disposition: A | Discharge status: Home / Self Care | Payer: Medicaid Other | Source: Ambulatory Visit | Attending: Acute Care | Admitting: Acute Care

## 2017-12-02 ENCOUNTER — Inpatient Hospital Stay (HOSPITAL_COMMUNITY)
Admission: RE | Admit: 2017-12-02 | Discharge: 2017-12-02 | Disposition: A | Discharge status: Home / Self Care | Payer: Medicaid Other | Source: Ambulatory Visit | Attending: Internal Medicine | Admitting: Internal Medicine

## 2017-12-02 DIAGNOSIS — E662 Morbid (severe) obesity with alveolar hypoventilation: Secondary | ICD-10-CM | POA: Insufficient documentation

## 2017-12-02 DIAGNOSIS — Z430 Encounter for attention to tracheostomy: Secondary | ICD-10-CM | POA: Diagnosis present

## 2017-12-02 DIAGNOSIS — G4733 Obstructive sleep apnea (adult) (pediatric): Secondary | ICD-10-CM | POA: Diagnosis not present

## 2017-12-02 DIAGNOSIS — Z930 Tracheostomy status: Secondary | ICD-10-CM | POA: Diagnosis not present

## 2017-12-02 DIAGNOSIS — I5032 Chronic diastolic (congestive) heart failure: Secondary | ICD-10-CM | POA: Diagnosis not present

## 2017-12-02 DIAGNOSIS — Z9119 Patient's noncompliance with other medical treatment and regimen: Secondary | ICD-10-CM | POA: Insufficient documentation

## 2017-12-02 DIAGNOSIS — J9611 Chronic respiratory failure with hypoxia: Secondary | ICD-10-CM

## 2017-12-02 LAB — BLOOD GAS, ARTERIAL
Acid-Base Excess: 7.1 mmol/L — ABNORMAL HIGH (ref 0.0–2.0)
Bicarbonate: 31 mmol/L — ABNORMAL HIGH (ref 20.0–28.0)
DRAWN BY: 244901
FIO2: 21
O2 SAT: 96.9 %
PATIENT TEMPERATURE: 98.6
pCO2 arterial: 43.1 mmHg (ref 32.0–48.0)
pH, Arterial: 7.471 — ABNORMAL HIGH (ref 7.350–7.450)
pO2, Arterial: 87.6 mmHg (ref 83.0–108.0)

## 2017-12-02 NOTE — Progress Notes (Signed)
Timothy Le   Reason for visit:  Follow-up tracheostomy care HPI:  57 year old male patient who I now follow for tracheostomy dependence.  I met him during his last hospitalization after his tracheostomy placement.  He has a history of chronic respiratory failure, severe sleep apnea, and obesity hypoventilation syndrome.  He was admitted for acute on chronic respiratory failure due to volume overload, decompensated diastolic heart failure, and untreated sleep apnea (he has 2 CPAP machines at home both of which he did not use).  He is since been seen in the pulmonary office, since his discharge she is lost over 50 pounds, and is making significant progress. ROS  Review of Systems - History obtained from the patient General ROS: negative for - chills, fatigue or fever Psychological ROS: negative ENT ROS: negative Allergy and Immunology ROS: negative Endocrine ROS: negative Respiratory ROS: no cough, shortness of breath, or wheezing Cardiovascular ROS: no chest pain or dyspnea on exertion Gastrointestinal ROS: no abdominal pain, change in bowel habits, or black or bloody stools Genito-Urinary ROS: no dysuria, trouble voiding, or hematuria Musculoskeletal ROS: negative Neurological ROS: no TIA or stroke symptoms  Vital signs:   Exam:  Physical Exam  Constitutional: He is oriented to person, place, and time. He appears well-developed and well-nourished. No distress.  HENT:  Head: Normocephalic and atraumatic.  Trach site unremarkable, excellent phonation w/ size 6 proximal XLT  Cardiovascular: Normal rate and regular rhythm.  Pulmonary/Chest: Effort normal and breath sounds normal. No apnea and no tachypnea. No respiratory distress.  Abdominal: Soft. Normal appearance.  Neurological: He is alert and oriented to person, place, and time.  Skin: Skin is warm, dry and intact. He is not diaphoretic.  Psychiatric: He has a normal mood and affect. His speech is normal.     Trach change/procedure:  The size 6 trach stoma is unremarkable. New #6 proximal XLT  cuffless trach was replaced w/out difficulty.       Impression/dx  Trach dependence  OHS OSA Prior non-compliance  Discussion  Timothy Le has been doing remarkable. He has lost over 50 lbs since d/c.  He may indeed be a candidate for decannulation but would like to see him get more weight off. His goal is to get less than 250 lbs. I think that's reasonable. Once he is there we need to down-size him to 5 prox xlt and then start capping trials. If he can tolerate capping trials we will need to repeat sleep/titration study and then further more prove he can be compliant w/ CPAP.   Plan  ROV 8 weeks for trach change Will work w/ Dr Annamaria Boots to coordinate timing on this.     Visit time: 32 minutes.   Timothy Le ACNP-BC [place]

## 2017-12-02 NOTE — Progress Notes (Signed)
Tracheostomy Procedure Note  Timothy Le [id number] June 21, 1960  Pre Procedure Tracheostomy Information  Trach Brand: Shiley Size: 6xl Style: Proximal and Uncuffed Secured by: Velcro   Procedure: trach change    Post Procedure Tracheostomy Information  Trach Brand: Shiley Size: 6xl Style: Proximal and Uncuffed Secured by: Velcro   Post Procedure Evaluation:  ETCO2 positive color change from yellow to purple : Yes.   Vital signs: pulse 93, respirations 28 and pulse oximetry 100% on RA with PMV Patients current condition: stable Complications: No apparent complications Trach site exam: clean, dry Wound care done: 4 x 4 gauze Patient did tolerate procedure well.   Education: None  Prescription needs: None  See back in trach clinic in 8 weeks

## 2018-01-10 ENCOUNTER — Ambulatory Visit (INDEPENDENT_AMBULATORY_CARE_PROVIDER_SITE_OTHER): Payer: Medicaid Other | Admitting: Internal Medicine

## 2018-01-10 ENCOUNTER — Encounter: Payer: Self-pay | Admitting: Internal Medicine

## 2018-01-10 VITALS — BP 128/72 | HR 104 | Ht 72.0 in | Wt 319.0 lb

## 2018-01-10 DIAGNOSIS — G4733 Obstructive sleep apnea (adult) (pediatric): Secondary | ICD-10-CM

## 2018-01-10 DIAGNOSIS — J9611 Chronic respiratory failure with hypoxia: Secondary | ICD-10-CM

## 2018-01-10 NOTE — Patient Instructions (Signed)
Order- referral to Marni Griffon, NP- tracheostomy clinic    Wean and decannulate as tolerated  Please call us about restarting CPAP after the trach stoma is closed

## 2018-01-10 NOTE — Assessment & Plan Note (Signed)
His weight has stabilized.  Still very much overweight. Plan-encouraged continued efforts to lose more weight.  We will see what happens as his tracheostomy is removed.

## 2018-01-10 NOTE — Assessment & Plan Note (Signed)
We may need to update his sleep study but hopefully we can resume CPAP as soon as his stoma closes.

## 2018-01-10 NOTE — Progress Notes (Signed)
HPI  M former smoker followed for COPD, chronic hypoxic respiratory failure OSA, OHS, complicated by morbid obesity, DM2, CHF, anemia PFT 09/21/13-severe obstruction with slight response to bronchodilator, restriction of exhaled volume, mild reduction diffusion NPSG 08/08/13- AHI 21.1/ hr, desaturation to 80%, body weight 385 lbs BIPAP titration 09/05/15 optimal pressure 22/18, body weight 365 lbs --------------------------------------------------------  05/21/16- 57 year old male former smoker followed for COPD, hypoxic respiratory failure, OSA, complicated by morbid obesity, OHS, DM, CHF BiPAP 22/18/ PS 4 / APS   O2 2 L continuous     Requalified today for O2  Not using BIPAP- DL shows no activity. follow up dme APS  at time wears his oxygen all the time at [facility]  He admits not taking medicines regularly, gaining weight again, not using BiPAP. Irregular, on discipline sleep schedule. Complains pollen is causing itching and sneezing.  11/26/2017- 57 year old male former smoker followed for COPD, hypoxic respiratory failure/ tracheostomy, OSA, complicated by morbid obesity, OHS, DM, CHF Now under the good primary care of Juluis Mire, NP Post hospital-discharge from rehab 07/28/2017-ventilator dependent respiratory failure, tracheostomy on 06/17/2017, gastrostomy tube 07/05/2017 with additional diagnoses DM, morbid obesity, acute on chronic anemia, hypertension. Change to #6, cuffless, extra long tracheostomy on 07/09/2017.  "No plans for decannulation until the patient has substantial weight loss".  Wound care nurse follow-up for sacral wound.  He had been noncompliant with CPAP prior to admission.  Contact had been made with [facility], tracheostomy clinic. He has been home about 5 weeks now, gastrostomy tube out.  He reports walking regularly and being much more strict about his diet.  Sacral wound has healed.  Getting regular home visits from home health nurse and rehab nurse.  He is up-to-date  on pneumonia vaccine but needs flu shot. He is eating regular food and denies choking or strangling with food or drink. CXR 06/24/2017- Tracheostomy tube and feeding catheter are again seen and stable. Stable cardiomegaly is noted. Lungs are well aerated bilaterally. Improved aeration in the right base is noted. Persistent opacity in the left base with consolidation and effusion is seen and stable. No new focal abnormality is noted. IMPRESSION: Stable changes in the left base -----1 yr f/u for OSA. Since his visit, he was diagnosed with CHF and had a trach placed.  Body weight today 318 pounds  01/10/2018- 57 year old male former smoker followed for COPD, hypoxic respiratory failure/ tracheostomy, OSA, complicated by morbid obesity, OHS, DM, CHF Now under the good primary care of Juluis Mire, NP Chronic Respiratory Failure with hypoxia and OSA: Pt has trach and is unable to use BiPAP and does not use O2 as well.  ABG on room air 12/02/2017-pH 7.47/PCO2 43/PO2 87.6/bicarb 31 Body weight today 319 pounds (320 pounds on 07/29/2017) Feeling well, no acute events.  He is looking forward to returning to the trach clinic to work on decannulation.   ROS-see HPI    + = positive Constitutional:    weight loss, night sweats, fevers, chills, + fatigue, lassitude. HEENT:   No-  headaches, difficulty swallowing, tooth/dental problems, sore throat,      sneezing, itching, ear ache, nasal congestion, post nasal drip,  CV:  No-   chest pain, +orthopnea, PND, swelling in lower extremities, no-anasarca, dizziness, palpitations Resp: +shortness of breath with exertion or at rest.                productive cough,  + non-productive cough,  No- coughing up of blood.  No-   change in color of mucus.   wheezing.   Skin: No-   rash or lesions. GI:  No-   heartburn, indigestion, abdominal pain,no- nausea, vomiting,  GU:  MS:  + joint pain or swelling.  . Neuro-     nothing unusual Psych:  No-  change in mood or affect. No depression or anxiety.  No memory loss.  OBJ- Physical Exam General- Alert, Oriented, Affect-appropriate, Distress- none acute,  + morbidly obese,       . Skin- rash-none, lesions- none, excoriation- none Lymphadenopathy- none Head- atraumatic            Eyes- Gross vision intact, PERRLA, conjunctivae and secretions clear            Ears- Hearing, canals-normal            Nose- Clear, no-Septal dev, mucus, polyps, erosion, perforation             Throat- Mallampati IV , mucosa clear , drainage- none, tonsils- atrophic, + somewhat stridorous respiration,                     + Shallow consistent with body habitus, normal voice quality Neck- + #6 tracheostomy tube with Passy-Muir valve.  Speech is clear and breathing unlabored at rest on room air. Chest - symmetrical excursion , unlabored           Heart/CV- RRR, distant , no murmur , no gallop  , no rub, nl s1 s2                           - JVD- none , edema-none, stasis changes- none, varices- none           Lung- diminished/clear +, wheeze- none, cough-none , dullness-none, rub- none                                   Chest wall-  Abd-  Br/ Gen/ Rectal- Not done, not indicated Extrem- cyanosis- none, clubbing, none, atrophy- none, strength- nl Neuro- grossly intact to observation

## 2018-01-10 NOTE — Assessment & Plan Note (Signed)
Primary problem is obesity hypoventilation syndrome.  We can proceed with weaning tracheostomy towards decannulation. Plan-refer back to tracheostomy clinic.

## 2018-01-20 ENCOUNTER — Telehealth: Payer: Self-pay | Admitting: Internal Medicine

## 2018-01-20 DIAGNOSIS — J9611 Chronic respiratory failure with hypoxia: Secondary | ICD-10-CM

## 2018-01-20 NOTE — Telephone Encounter (Signed)
Pt seen 01-10-18 by CY-stated unable to use BiPAP and O2 due to having trach placed. Therefore we will order ONO on Room Air with trach in.   Order placed to [facility] and nothing more needed at this time.

## 2018-01-27 ENCOUNTER — Ambulatory Visit (HOSPITAL_COMMUNITY)
Admission: RE | Admit: 2018-01-27 | Discharge: 2018-01-27 | Disposition: A | Discharge status: Home / Self Care | Payer: Medicaid Other | Source: Ambulatory Visit | Attending: Acute Care | Admitting: Acute Care

## 2018-01-27 ENCOUNTER — Telehealth: Payer: Self-pay | Admitting: *Deleted

## 2018-01-27 DIAGNOSIS — Z6831 Body mass index (BMI) 31.0-31.9, adult: Secondary | ICD-10-CM | POA: Diagnosis not present

## 2018-01-27 DIAGNOSIS — Z930 Tracheostomy status: Secondary | ICD-10-CM | POA: Insufficient documentation

## 2018-01-27 DIAGNOSIS — E662 Morbid (severe) obesity with alveolar hypoventilation: Secondary | ICD-10-CM | POA: Insufficient documentation

## 2018-01-27 DIAGNOSIS — G4733 Obstructive sleep apnea (adult) (pediatric): Secondary | ICD-10-CM | POA: Diagnosis not present

## 2018-01-27 NOTE — Progress Notes (Signed)
[facility]   Reason for visit:  Trach follow up and assess for decannulation  HPI:  58 year old male patient with a history of OHS and OSA in the setting of severe morbid obesity.  He has been tracheostomy dependent since he was admitted several months back for decompensated cor pulmonale, and diastolic dysfunction.  He has since made remarkable improvement overall in his lifestyle with significant weight loss.  He is lost in excess of 50 pounds, and continues to improve even since my last visit with him Back in November 2019.  He has since seen Dr. Annamaria Boots once again in follow-up, and has been referred back here again for evaluation to begin decannulation process ROS  Review of Systems - History obtained from the patient General ROS: negative for - chills, fatigue, fever, malaise, night sweats, sleep disturbance or weight gain Psychological ROS: negative ENT ROS: negative for - epistaxis, headaches, nasal congestion, nasal discharge, oral lesions, sinus pain, sneezing or sore throat Allergy and Immunology ROS: negative Hematological and Lymphatic ROS: negative Endocrine ROS: negative Respiratory ROS: negative for - cough, hemoptysis, shortness of breath, sputum changes, tachypnea or wheezing Cardiovascular ROS: no chest pain or dyspnea on exertion Gastrointestinal ROS: no abdominal pain, change in bowel habits, or black or bloody stools Genito-Urinary ROS: no dysuria, trouble voiding, or hematuria Musculoskeletal ROS: negative Neurological ROS: no TIA or stroke symptoms  Vital signs:  Heart rate 87 respirations 22 blood pressure 128/73  Exam:  Physical Exam Constitutional:      Appearance: Normal appearance. He is obese. He is not ill-appearing or diaphoretic.  HENT:     Head: Normocephalic and atraumatic.     Nose: Nose normal. No congestion.     Mouth/Throat:     Mouth: Mucous membranes are moist.     Pharynx: Oropharynx is clear. No oropharyngeal exudate.   Eyes:     Pupils: Pupils are equal, round, and reactive to light.  Neck:     Musculoskeletal: Normal range of motion.  Cardiovascular:     Comments: Tracheostomy site is unremarkable excellent phonation with Passy-Muir valve, tolerates tracheal site occlusion following downsizing of trach to size 5 Pulmonary:     Effort: Pulmonary effort is normal. No tachypnea, respiratory distress or retractions.     Breath sounds: Normal breath sounds.  Abdominal:     General: Bowel sounds are normal. There is no distension. There are no signs of injury.     Palpations: Abdomen is soft.  Musculoskeletal: Normal range of motion.  Skin:    General: Skin is warm and dry.     Capillary Refill: Capillary refill takes less than 2 seconds.  Neurological:     General: No focal deficit present.     Mental Status: He is alert.  Psychiatric:        Attention and Perception: Perception normal. He is inattentive.        Mood and Affect: Mood and affect normal.     Trach change/procedure: Size 6 proximal XLT tracheostomy was removed without difficulty.  The patient was prepped with lidocaine jelly, then the site was 5 proximal cuffless XLT placed without difficulty.      Impression/dx  Tracheostomy dependence Obesity hypoventilation syndrome Obstructive sleep apnea  Discussion  58 year old male patient with obesity hypoventilation syndrome and obstructive sleep apnea.  He has been doing remarkably well with his weight loss and is ready to advance towards decannulation.  I have downsized him to a size 5 cuffless proximal XLT tracheostomy  today in clinic, we have placed a occlusive tracheostomy, and extensively discussed plan of care Plan  Keep tracheostomy occluded at all times, as tolerated. I have asked him to initiate CPAP once again at night, instructed him to use his old settings I would like to ensure that he can tolerate CPAP before we proceed with decannulation, as he has had trouble with  compliance in the past He will call the office back on 1/13, at which time we will discuss how he is doing with tracheostomy occluded, tolerance, and need for suctioning if at all.  If there are no issues I will plan to decannulate him late next week    Visit time: 45 minutes.   Erick Colace ACNP-BC [place]

## 2018-01-27 NOTE — Telephone Encounter (Signed)
-----   Message from Erick Colace, NP sent at 01/27/2018  1:47 PM EST ----- Hello team,Can you contact the patient's DME he apparently uses [facility] in [place] [phone]  We need to get him sterile water for his CPAP machine  Thanks Pete

## 2018-01-27 NOTE — Progress Notes (Signed)
Tracheostomy Procedure Note  KAMDIN FOLLETT [id number] 1960-12-09  Pre Procedure Tracheostomy Information  Trach Brand: Shiley Size: 6.0 XLTUP Style: Proximal, uncuffed  Secured by: Velcro   Procedure: trach change out, down sized to SHILEY 5.0 XLTUP    Post Procedure Tracheostomy Information  Trach Brand: Shiley Size: 5.0 XLTUP Style: Proximal, uncuffed Secured by: Velcro   Post Procedure Evaluation:  ETCO2 positive color change from yellow to purple : Yes.   Vital signs:blood pressure 128/73, pulse 87, respirations 22 and pulse oximetry *98 % Patients current condition: stable Complications: No apparent complications Trach site exam: clean, dry, healed Wound care done: dry, clean, 4 x 4 gauze Patient did tolerate procedure well.   Education: Patient downsized to Shiley 5.0 XLTUP trach, Pete instructed patient to sleep red cap and use CPAP as tolerated.  If unable, patient has been instructed to call Laure[facility] Arrow to inform him of status  Prescription needs: none    Additional needs:none

## 2018-01-27 NOTE — Telephone Encounter (Signed)
We have [facility] on file but last order was 2018 Called [facility] and spoke with Timothy Le who verified that patient is still under their services  Laurey Arrow, can you clarify?  Distilled water is typically used in CPAP and is purchased OTC and DME will likely be unable to obtain sterile water for this purpose.  DME can provide a new reservoir if this is what is needed.  Thank you!

## 2018-02-02 ENCOUNTER — Ambulatory Visit (HOSPITAL_COMMUNITY)
Admission: RE | Admit: 2018-02-02 | Discharge: 2018-02-02 | Disposition: A | Discharge status: Home / Self Care | Payer: Medicaid Other | Source: Ambulatory Visit | Attending: Acute Care | Admitting: Acute Care

## 2018-02-02 DIAGNOSIS — Z930 Tracheostomy status: Secondary | ICD-10-CM

## 2018-02-02 DIAGNOSIS — Z430 Encounter for attention to tracheostomy: Secondary | ICD-10-CM | POA: Insufficient documentation

## 2018-02-02 DIAGNOSIS — E662 Morbid (severe) obesity with alveolar hypoventilation: Secondary | ICD-10-CM | POA: Diagnosis not present

## 2018-02-02 DIAGNOSIS — G4733 Obstructive sleep apnea (adult) (pediatric): Secondary | ICD-10-CM | POA: Diagnosis not present

## 2018-02-02 NOTE — Progress Notes (Signed)
    Thank you for your visit today.   Today we removed your trach   Instructions: -keep current dressing in place for next 24 -48  hrs -after 48 hrs place a sterile bandage over stoma until completely closed.  -do not submerge yourself under water -routine skin care w/ soap and water around stoma site.  -may need to place finger over bandage to assist w/ voice quality for next 24-48hrs -please feel free to call trach clinic @ [phone] w/ questions or concerns     Erick Colace ACNP-BC [place]

## 2018-02-02 NOTE — Progress Notes (Signed)
Tracheostomy Procedure Note  Timothy Le [id number] 05-02-1960  Pre Procedure Tracheostomy Information  Trach Brand: Shiley Size: 5.0 Style: Proximal and Uncuffed Secured by: Velcro   Procedure: decannulation     Post Procedure Evaluation: Vital signs: pulse 129, respirations 24 and pulse oximetry 92 % Patients current condition: stable Complications: No apparent complications Trach site exam: clean, dry Wound care done: dry, clean and 4 x 4 gauze Patient did tolerate procedure well.   Education: Decannulation care  Prescription needs:     Additional needs:

## 2018-02-02 NOTE — Progress Notes (Signed)
[facility]   Reason for visit:  Decannulation  HPI:  [facility] returns to trach clinic today for decannulation.  On her last visit, I downsized him to a 5 proximal cuffless tracheostomy and instructed him to wear his red occlusive valve is much as possible.  In addition to this I asked him to start using his CPAP machine again.  As instructed he he contacted me in the clinic on the 13th of this week to update me on his progress.  He has been using the CPAP without difficulty, did have some problems with mask leak but that seems to resolved.  He presents today doing quite well, ready for decannulation. ROS  Review of Systems - History obtained from the patient General ROS: negative for - chills, fatigue, fever, malaise, night sweats or sleep disturbance Psychological ROS: negative ENT ROS: negative for - headaches, nasal congestion, nasal discharge, sinus pain, sneezing or sore throat Allergy and Immunology ROS: negative Hematological and Lymphatic ROS: negative Endocrine ROS: negative Respiratory ROS: no cough, shortness of breath, or wheezing Cardiovascular ROS: no chest pain or dyspnea on exertion Gastrointestinal ROS: no abdominal pain, change in bowel habits, or black or bloody stools Genito-Urinary ROS: no dysuria, trouble voiding, or hematuria Musculoskeletal ROS: negative Neurological ROS: no TIA or stroke symptoms  Vital signs:  Reviewed  Exam:  Physical Exam  Trach change/procedure: The size 5 tracheostomy tube was removed.  A occlusive dressing was placed      Impression/dx  Resolved tracheostomy dependence Obstructive sleep apnea Obesity hypoventilation syndrome Discussion  [name] is now successfully decannulated.  Tolerated procedure without difficulty.  I anticipate his stoma should close spontaneously without difficulty.  He currently has an occlusive dressing in place Plan  Keep current occlusive dressing x24 to 48 hours Keep regular Band-Aid  after this and dressed stoma until stoma completely closed If stoma not completely closed after 4 weeks will ask ENT to see Continue current diet Continue CPAP nightly He has clinic number should he have questions    Visit time: 34  minutes.   [facility] ACNP-BC [facility]

## 2018-03-02 ENCOUNTER — Other Ambulatory Visit: Payer: Self-pay | Admitting: Acute Care

## 2018-03-02 ENCOUNTER — Ambulatory Visit (HOSPITAL_COMMUNITY)
Admission: RE | Admit: 2018-03-02 | Discharge: 2018-03-02 | Disposition: A | Discharge status: Home / Self Care | Payer: Medicaid Other | Source: Ambulatory Visit | Attending: Acute Care | Admitting: Acute Care

## 2018-03-02 DIAGNOSIS — J398 Other specified diseases of upper respiratory tract: Secondary | ICD-10-CM

## 2018-03-02 DIAGNOSIS — R061 Stridor: Secondary | ICD-10-CM | POA: Diagnosis not present

## 2018-03-02 DIAGNOSIS — J9504 Tracheo-esophageal fistula following tracheostomy: Secondary | ICD-10-CM

## 2018-03-02 DIAGNOSIS — Z930 Tracheostomy status: Secondary | ICD-10-CM

## 2018-03-11 DIAGNOSIS — R061 Stridor: Secondary | ICD-10-CM | POA: Insufficient documentation

## 2018-03-11 DIAGNOSIS — J9504 Tracheo-esophageal fistula following tracheostomy: Secondary | ICD-10-CM | POA: Insufficient documentation

## 2018-03-11 NOTE — Progress Notes (Signed)
[facility]   Reason for visit:  Tracheostomy not completely closed following decannulation HPI:  58 year old male patient well-known to me, I follow him for tracheostomy management in the setting of severe sleep apnea and obesity hypoventilation syndrome.  Over the last several months he has been successful with significant weight loss, he is also demonstrated his ability to be compliant with his CPAP device, and after subsequent downsizing of tracheostomy and successful capping trials as well as successful utilization of CPAP we proceeded with decannulation on 1/15.  He has been doing well however he did call the tracheostomy clinic last week with report of still intermittent sometimes foul-smelling drainage from the tracheostomy stoma site as well as ongoing altered phonation quality.  Typically after 2 weeks the stoma should be completely closed, however speaking to him over the phone his voice quality raise concern about possible tracheal stenosis or vocal cord injury so I asked him to come in before I referred him to ENT so that I could evaluate him closer ROS  Review of Systems - History obtained from the patient General ROS: negative for - chills, fatigue, fever, malaise, night sweats, sleep disturbance or weight gain Psychological ROS: negative for - anxiety or behavioral disorder ENT ROS: negative for - nasal congestion, nasal discharge, sinus pain or sore throat Allergy and Immunology ROS: negative for - nasal congestion Respiratory ROS: no cough, shortness of breath, or wheezing Cardiovascular ROS: no chest pain or dyspnea on exertion Gastrointestinal ROS: no abdominal pain, change in bowel habits, or black or bloody stools Neurological ROS: no TIA or stroke symptoms  Vital signs:  Reviewed  Exam:  Physical Exam Vitals signs and nursing note reviewed.  Constitutional:      General: He is not in acute distress.    Appearance: Normal appearance. He is not  ill-appearing or toxic-appearing.  HENT:     Head: Normocephalic.     Comments: The tracheostomy site was evaluated.  I did place end-tidal CO2 over all trach stoma.  There is no evidence of a leak.  I am not sure if this is still intermittent, or if it is now closed.  There is no drainage or air escape on exam.  He does have however stridorous inspiratory wheeze, and altered phonation from baseline raising concern about possible vocal cord injury or tracheal stenosis Cardiovascular:     Rate and Rhythm: Normal rate and regular rhythm.  Pulmonary:     Effort: Pulmonary effort is normal.     Breath sounds: Normal breath sounds.  Abdominal:     General: Abdomen is protuberant. There is no distension. There are no signs of injury.     Palpations: Abdomen is soft.  Skin:    General: Skin is warm and dry.  Neurological:     General: No focal deficit present.     Mental Status: He is alert.     Trach change/procedure: not indicated       Impression/dx  Resolved tracheostomy dependence Rule out tracheal stenosis versus vocal cord injury Rule out tracheocutaneous fistula Discussion  I do not see clear evidence of tracheocutaneous fistula at least from gross exam or end-tidal CO2.  I suppose this still could be intermittent.  Certainly not emergent.  I am concerned about his vocal quality, I am not sure if his altered phonation is a consequence of vocal cord injury or tracheal stenosis.  I think he would benefit from ENT evaluation and I will proceed with referral.  I  think he will need direct visualization of his upper airway to further evaluate these concerns Plan  Referral placed to ENT  Erick Colace ACNP-BC [place] Pager # [phone] OR # [phone] if no answer     Visit time: 32 minutes.   Erick Colace ACNP-BC [place]

## 2018-03-15 NOTE — Telephone Encounter (Signed)
Called spoke with patient who reported he is doing well with CPAP and typically purchases his own water distilled water from [employer].  Patient did mention that he has some concerns about his trach stoma (was decanulated on 2.12.2020) but is still having some drainage and is not completely closed yet.  Advised patient will call the trach clinic on his behalf and request Laurey Arrow to call him.  Will also ask [facility] to check on the ENT referral that was placed on 2.12.2020 as patient has not heard anything from that office.  Called the trach clinic and spoke with Baxter Flattery who will have [facility] call patient.

## 2018-03-20 ENCOUNTER — Encounter: Payer: Self-pay | Admitting: Internal Medicine

## 2018-03-21 ENCOUNTER — Encounter: Payer: Self-pay | Admitting: Internal Medicine

## 2018-03-21 ENCOUNTER — Ambulatory Visit (INDEPENDENT_AMBULATORY_CARE_PROVIDER_SITE_OTHER): Payer: Medicaid Other | Admitting: Internal Medicine

## 2018-03-21 VITALS — BP 128/70 | HR 82 | Ht 68.0 in | Wt 329.0 lb

## 2018-03-21 DIAGNOSIS — E662 Morbid (severe) obesity with alveolar hypoventilation: Secondary | ICD-10-CM | POA: Diagnosis not present

## 2018-03-21 DIAGNOSIS — Z930 Tracheostomy status: Secondary | ICD-10-CM

## 2018-03-21 DIAGNOSIS — G4733 Obstructive sleep apnea (adult) (pediatric): Secondary | ICD-10-CM | POA: Diagnosis not present

## 2018-03-21 DIAGNOSIS — J9611 Chronic respiratory failure with hypoxia: Secondary | ICD-10-CM | POA: Diagnosis not present

## 2018-03-21 NOTE — Assessment & Plan Note (Signed)
He needs to benefit from BiPAP with improved sleep nocturnal ventilation.  Download confirms. Plan-refit mask hoping for better seal with less skin pressure Continue with BiPAP 22/18 replacing broken mask

## 2018-03-21 NOTE — Assessment & Plan Note (Addendum)
Stoma is closed now to airflow even with CPAP at night.  He is pending ENT evaluation for hoarseness after prolonged intubation and they will also assess status of his stoma wound.

## 2018-03-21 NOTE — Assessment & Plan Note (Signed)
Weight is up 10 pounds since December.  If this continues they will outline of the gains he has made.  Diet caution emphasized.

## 2018-03-21 NOTE — Assessment & Plan Note (Signed)
Arrival saturation today was 97% on room air.

## 2018-03-21 NOTE — Patient Instructions (Signed)
Order- DME [facility]- please refit and replace mask of choice, supplies, continue BIPAP 22/18,  Humidifier, AirView/ card  Keep your ENT appointment to look at your vocal cords and trachea  Keep up the good work with your diet- need to keep your weight down.   Please call if we can help

## 2018-03-21 NOTE — Progress Notes (Signed)
HPI  M former smoker followed for COPD, chronic hypoxic respiratory failure OSA, OHS, complicated by morbid obesity, DM2, CHF, anemia PFT 09/21/13-severe obstruction with slight response to bronchodilator, restriction of exhaled volume, mild reduction diffusion NPSG 08/08/13- AHI 21.1/ hr, desaturation to 80%, body weight 385 lbs BIPAP titration 09/05/15 optimal pressure 22/18, body weight 365 lbs -------------------------------------------------------- 01/10/2018- 58 year old male former smoker followed for COPD, hypoxic respiratory failure/ tracheostomy, OSA, complicated by morbid obesity, OHS, DM, CHF Now under the good primary care of Juluis Mire, NP Chronic Respiratory Failure with hypoxia and OSA: Pt has trach and is unable to use BiPAP and does not use O2 as well.  ABG on room air 12/02/2017-pH 7.47/PCO2 43/PO2 87.6/bicarb 31 Body weight today 319 pounds (320 pounds on 07/29/2017) Feeling well, no acute events.  He is looking forward to returning to the trach clinic to work on decannulation.  03/21/2018- 58 year old male former smoker followed for COPD, hypoxic respiratory failure/ tracheostomy, OSA, complicated by morbid obesity, OHS, DM, CHF -----pt SOB on exertion; on CPAP, states using every nights, mask is cracked  Body weight today 329 pounds BIPAP 22/18/ [facility] Download 90% compliance AHI 3.8/hour He reports sleeping well with his BiPAP machine and denies air leak at the tracheostomy site.  He does have some residual fluid seepage and the pink area scarring at the stoma.  He has an ENT appointment pending next week to check his vocal cords because of hoarseness after intubation. He denies significant dyspnea with limited activity.  ROS-see HPI    + = positive Constitutional:    weight loss, night sweats, fevers, chills, + fatigue, lassitude. HEENT:   No-  headaches, difficulty swallowing, tooth/dental problems, sore throat,      sneezing, itching, ear ache, nasal congestion, post  nasal drip,  CV:  No-   chest pain, +orthopnea, PND, swelling in lower extremities, no-anasarca, dizziness, palpitations Resp: +shortness of breath with exertion or at rest.                productive cough,  + non-productive cough,  No- coughing up of blood.              No-   change in color of mucus.   wheezing.   Skin: No-   rash or lesions. GI:  No-   heartburn, indigestion, abdominal pain,no- nausea, vomiting,  GU:  MS:  + joint pain or swelling.  . Neuro-     nothing unusual Psych:  No- change in mood or affect. No depression or anxiety.  No memory loss.  OBJ- Physical Exam General- Alert, Oriented, Affect-appropriate, Distress- none acute,  + morbidly obese,   .Skin- Pink around nose where impressed by CPAP mask.  Lymphadenopathy- none Head- atraumatic            Eyes- Gross vision intact, PERRLA, conjunctivae and secretions clear            Ears- Hearing, canals-normal            Nose- Clear, no-Septal dev, mucus, polyps, erosion, perforation             Throat- Mallampati IV , mucosa clear , drainage- none, tonsils- atrophic,                     +Stridor with cough                    + Shallow consistent with body habitus,  Neck- + Breathing unlabored at rest  on room air. Pink scar granuloma tag at trach site. Chest - symmetrical excursion , unlabored           Heart/CV- RRR, distant , no murmur , no gallop  , no rub, nl s1 s2                           - JVD- none , edema-none, stasis changes- none, varices- none           Lung- diminished/clear +, wheeze- none, cough-none , dullness-none, rub- none                                   Chest wall-  Abd-  Br/ Gen/ Rectal- Not done, not indicated Extrem- cyanosis- none, clubbing, none, atrophy- none, strength- nl Neuro- grossly intact to observation

## 2018-04-13 ENCOUNTER — Other Ambulatory Visit: Payer: Self-pay | Admitting: Otolaryngology

## 2018-04-13 DIAGNOSIS — J9503 Malfunction of tracheostomy stoma: Secondary | ICD-10-CM

## 2018-04-13 DIAGNOSIS — J398 Other specified diseases of upper respiratory tract: Secondary | ICD-10-CM

## 2018-04-20 ENCOUNTER — Other Ambulatory Visit: Payer: Self-pay

## 2018-06-21 ENCOUNTER — Other Ambulatory Visit: Payer: Medicaid Other

## 2018-07-05 ENCOUNTER — Other Ambulatory Visit: Payer: Self-pay

## 2018-07-05 ENCOUNTER — Ambulatory Visit
Admission: RE | Admit: 2018-07-05 | Discharge: 2018-07-05 | Disposition: A | Discharge status: Home / Self Care | Payer: Medicaid Other | Source: Ambulatory Visit | Attending: Otolaryngology | Admitting: Otolaryngology

## 2018-07-05 DIAGNOSIS — J9503 Malfunction of tracheostomy stoma: Secondary | ICD-10-CM

## 2018-07-05 DIAGNOSIS — J398 Other specified diseases of upper respiratory tract: Secondary | ICD-10-CM

## 2018-08-25 ENCOUNTER — Other Ambulatory Visit (INDEPENDENT_AMBULATORY_CARE_PROVIDER_SITE_OTHER): Payer: Self-pay | Admitting: Primary Care

## 2018-09-20 ENCOUNTER — Encounter: Payer: Self-pay | Admitting: Internal Medicine

## 2018-09-21 ENCOUNTER — Ambulatory Visit: Payer: Medicaid Other | Admitting: Internal Medicine

## 2018-09-21 ENCOUNTER — Other Ambulatory Visit: Payer: Self-pay

## 2018-09-21 ENCOUNTER — Encounter: Payer: Self-pay | Admitting: Internal Medicine

## 2018-09-21 ENCOUNTER — Telehealth: Payer: Self-pay | Admitting: Internal Medicine

## 2018-09-21 VITALS — BP 142/98 | HR 85 | Temp 98.3°F | Ht 68.0 in | Wt 349.0 lb

## 2018-09-21 DIAGNOSIS — Z23 Encounter for immunization: Secondary | ICD-10-CM

## 2018-09-21 DIAGNOSIS — J449 Chronic obstructive pulmonary disease, unspecified: Secondary | ICD-10-CM | POA: Diagnosis not present

## 2018-09-21 DIAGNOSIS — G4733 Obstructive sleep apnea (adult) (pediatric): Secondary | ICD-10-CM

## 2018-09-21 DIAGNOSIS — E662 Morbid (severe) obesity with alveolar hypoventilation: Secondary | ICD-10-CM

## 2018-09-21 DIAGNOSIS — J9504 Tracheo-esophageal fistula following tracheostomy: Secondary | ICD-10-CM

## 2018-09-21 NOTE — Progress Notes (Signed)
HPI  M former smoker followed for COPD, chronic hypoxic respiratory failure OSA, OHS, complicated by morbid obesity, DM2, CHF, anemia PFT 09/21/13-severe obstruction with slight response to bronchodilator, restriction of exhaled volume, mild reduction diffusion NPSG 08/08/13- AHI 21.1/ hr, desaturation to 80%, body weight 385 lbs BIPAP titration 09/05/15 optimal pressure 22/18, body weight 365 lbs --------------------------------------------------------  03/21/2018- 57 year old male former smoker followed for COPD, hypoxic respiratory failure/ tracheostomy, OSA, complicated by morbid obesity, OHS, DM, CHF -----pt SOB on exertion; on CPAP, states using every nights, mask is cracked  Body weight today 329 pounds BIPAP 22/18/ APS Download 90% compliance AHI 3.8/hour He reports sleeping well with his BiPAP machine and denies air leak at the tracheostomy site.  He does have some residual fluid seepage and the pink area scarring at the stoma.  He has an ENT appointment pending next week to check his vocal cords because of hoarseness after intubation. He denies significant dyspnea with limited activity.  09/21/2018- 58 year old male former smoker followed for COPD, hypoxic respiratory failure/ tracheostomy, OSA, complicated by morbid obesity, OHS, DM, CHF -----OSA on BIPAP 22/18, DME: [facility] ; pt reports recent "split" in mask, needs new mask; pt would also like to receive flu shot today Body weight today 349 lbs Tracheostomy removed in January Uses Proventil HFA 3x/ week with exercise Room air sat on arrival 0000000 Dr Erik Obey / ENT evaluated in March, was concerned about tracheal stenosis, wanted to do CT chest and get [place] to do PFT. Download 63% compliance, AHI 8.7/ hr CT chest 07/05/2018-  discussed IMPRESSION: 1. Thyroid goiter with compression of the trachea could be contributory to the patient's symptoms. 2. No worrisome pulmonary lesions or acute pulmonary findings. Mild stable emphysematous  changes but no interstitial lung disease or bronchiectasis. 3. Stable scattered mediastinal and hilar lymph nodes but no mass or adenopathy. Aortic Atherosclerosis (ICD10-I70.0) and Emphysema (ICD10-J43.9).  ROS-see HPI    + = positive Constitutional:    weight loss, night sweats, fevers, chills, + fatigue, lassitude. HEENT:   No-  headaches, difficulty swallowing, tooth/dental problems, sore throat,      sneezing, itching, ear ache, nasal congestion, post nasal drip,  CV:  No-   chest pain, +orthopnea, PND, swelling in lower extremities, no-anasarca, dizziness, palpitations Resp: +shortness of breath with exertion or at rest.                productive cough,  + non-productive cough,  No- coughing up of blood.              No-   change in color of mucus.   wheezing.   Skin: No-   rash or lesions. GI:  No-   heartburn, indigestion, abdominal pain,no- nausea, vomiting,  GU:  MS:  + joint pain or swelling.  . Neuro-     nothing unusual Psych:  No- change in mood or affect. No depression or anxiety.  No memory loss.  OBJ- Physical Exam General- Alert, Oriented, Affect-appropriate, Distress- none acute,  + morbidly obese,   .Skin- Pink around nose where impressed by CPAP mask.  Lymphadenopathy- none Head- atraumatic            Eyes- Gross vision intact, PERRLA, conjunctivae and secretions clear            Ears- Hearing, canals-normal            Nose- Clear, no-Septal dev, mucus, polyps, erosion, perforation             Throat-  Mallampati IV , mucosa clear , drainage- none, tonsils- atrophic,                     +Stridorous resp                    + Shallow consistent with body habitus,  Neck- + Breathing unlabored at rest on room air. Pink scar granuloma tag at trach site. Chest - symmetrical excursion , unlabored           Heart/CV- RRR, distant , no murmur , no gallop  , no rub, nl s1 s2                           - JVD- none , edema-none, stasis changes- none, varices- none            Lung- diminished/clear , wheeze- none, cough-none , dullness-none, rub- none                                   Chest wall-  Abd-  Br/ Gen/ Rectal- Not done, not indicated Extrem- cyanosis- none, clubbing, none, atrophy- none, strength- nl Neuro- grossly intact to observation

## 2018-09-21 NOTE — Telephone Encounter (Signed)
Pt needs a PFT scheduled at the next available per CY. Order has been placed at office visit 09/21/2018. Routing to [place] per PFT protocol.

## 2018-09-21 NOTE — Patient Instructions (Addendum)
Order- schedule PFT  Dx COPD mixed type     Next available Order- Flu vax standard  Make yourself a follow-up visit with Timothy Le for primary care follow-up. She will make you a follow-up appointment with Dr Erik Obey, the ENT doctor.  Order- DME   [facility]    Please replace broken CPAP mask of choice. Continue BIPAP 22/18  Please call if we can help

## 2018-09-27 NOTE — Telephone Encounter (Signed)
Scheduled pft on 10/11/2018-pr

## 2018-10-01 NOTE — Assessment & Plan Note (Addendum)
Benefits from BIPAP Plan- emphasis on compliance and weight control. Continue  BIPAP 22/18

## 2018-10-01 NOTE — Assessment & Plan Note (Signed)
Major emphasis on avoiding weight gain. Told him to get out and walk. Covid careful.

## 2018-10-01 NOTE — Assessment & Plan Note (Signed)
Stoma appears to have closed at this visit

## 2018-10-01 NOTE — Assessment & Plan Note (Signed)
We are scheduling PFT as requested by ENT, with particular attention to any evidence of upper airway flow limitation by possible tracheal stenosis. Flu shot.

## 2018-10-03 ENCOUNTER — Other Ambulatory Visit: Payer: Self-pay | Admitting: Internal Medicine

## 2018-10-07 ENCOUNTER — Other Ambulatory Visit (HOSPITAL_COMMUNITY)
Admission: RE | Admit: 2018-10-07 | Discharge: 2018-10-07 | Disposition: A | Discharge status: Home / Self Care | Payer: Medicaid Other | Source: Ambulatory Visit | Attending: Internal Medicine | Admitting: Internal Medicine

## 2018-10-07 DIAGNOSIS — Z20828 Contact with and (suspected) exposure to other viral communicable diseases: Secondary | ICD-10-CM | POA: Diagnosis not present

## 2018-10-07 DIAGNOSIS — Z01812 Encounter for preprocedural laboratory examination: Secondary | ICD-10-CM | POA: Insufficient documentation

## 2018-10-08 LAB — NOVEL CORONAVIRUS, NAA (HOSP ORDER, SEND-OUT TO REF LAB; TAT 18-24 HRS): SARS-CoV-2, NAA: NOT DETECTED

## 2018-10-11 ENCOUNTER — Other Ambulatory Visit: Payer: Self-pay

## 2018-10-11 ENCOUNTER — Ambulatory Visit (INDEPENDENT_AMBULATORY_CARE_PROVIDER_SITE_OTHER): Payer: Medicaid Other | Admitting: Internal Medicine

## 2018-10-11 DIAGNOSIS — J449 Chronic obstructive pulmonary disease, unspecified: Secondary | ICD-10-CM

## 2018-10-11 LAB — PULMONARY FUNCTION TEST
DL/VA % pred: 133 %
DL/VA: 5.79 ml/min/mmHg/L
DLCO unc % pred: 66 %
DLCO unc: 17.55 ml/min/mmHg
FEF 25-75 Post: 1.72 L/sec
FEF 25-75 Pre: 1.06 L/sec
FEF2575-%Change-Post: 61 %
FEF2575-%Pred-Post: 60 %
FEF2575-%Pred-Pre: 37 %
FEV1-%Change-Post: 24 %
FEV1-%Pred-Post: 59 %
FEV1-%Pred-Pre: 48 %
FEV1-Post: 1.77 L
FEV1-Pre: 1.42 L
FEV1FVC-%Change-Post: 31 %
FEV1FVC-%Pred-Pre: 79 %
FEV6-%Change-Post: -5 %
FEV6-%Pred-Post: 59 %
FEV6-%Pred-Pre: 62 %
FEV6-Post: 2.16 L
FEV6-Pre: 2.28 L
FEV6FVC-%Pred-Post: 103 %
FEV6FVC-%Pred-Pre: 103 %
FVC-%Change-Post: -5 %
FVC-%Pred-Post: 57 %
FVC-%Pred-Pre: 60 %
FVC-Post: 2.16 L
FVC-Pre: 2.28 L
Post FEV1/FVC ratio: 82 %
Post FEV6/FVC ratio: 100 %
Pre FEV1/FVC ratio: 62 %
Pre FEV6/FVC Ratio: 100 %
RV % pred: 112 %
RV: 2.33 L
TLC % pred: 66 %
TLC: 4.4 L

## 2018-10-11 NOTE — Progress Notes (Signed)
Full PFT performed today. °

## 2019-01-24 ENCOUNTER — Ambulatory Visit: Payer: Medicaid Other | Admitting: Internal Medicine

## 2019-01-24 ENCOUNTER — Other Ambulatory Visit: Payer: Self-pay

## 2019-01-24 ENCOUNTER — Encounter: Payer: Self-pay | Admitting: Internal Medicine

## 2019-01-24 VITALS — BP 128/82 | HR 91 | Temp 97.8°F | Ht 68.0 in | Wt 354.0 lb

## 2019-01-24 DIAGNOSIS — G4733 Obstructive sleep apnea (adult) (pediatric): Secondary | ICD-10-CM

## 2019-01-24 DIAGNOSIS — Z7689 Persons encountering health services in other specified circumstances: Secondary | ICD-10-CM

## 2019-01-24 DIAGNOSIS — E662 Morbid (severe) obesity with alveolar hypoventilation: Secondary | ICD-10-CM

## 2019-01-24 DIAGNOSIS — M7989 Other specified soft tissue disorders: Secondary | ICD-10-CM | POA: Insufficient documentation

## 2019-01-24 DIAGNOSIS — D499 Neoplasm of unspecified behavior of unspecified site: Secondary | ICD-10-CM

## 2019-01-24 DIAGNOSIS — J449 Chronic obstructive pulmonary disease, unspecified: Secondary | ICD-10-CM

## 2019-01-24 MED ORDER — BUDESONIDE-FORMOTEROL FUMARATE 160-4.5 MCG/ACT IN AERO: INHALATION_SPRAY | RESPIRATORY_TRACT | 12 refills | Status: DC

## 2019-01-24 NOTE — Patient Instructions (Signed)
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

## 2019-01-24 NOTE — Assessment & Plan Note (Signed)
Says this regrew after initial cautery. Plan- referral to surgeon

## 2019-01-24 NOTE — Progress Notes (Signed)
HPI  M former smoker followed for COPD, chronic hypoxic respiratory failure OSA, OHS, complicated by morbid obesity, DM2, CHF, anemia PFT 09/21/13-severe obstruction with slight response to bronchodilator, restriction of exhaled volume, mild reduction diffusion NPSG 08/08/13- AHI 21.1/ hr, desaturation to 80%, body weight 385 lbs BIPAP titration 09/05/15 optimal pressure 22/18, body weight 365 lbs PFT 10/11/2018- Severe obstruction with response to dilator. Severe restriction, diffusion moderately reduced. FVC 2.16/ 57%, FEV1 1.77/ 59%, Ratio 0.82, TLC 66%, DLCO 66% --------------------------------------------------------  09/21/2018- 59 year old male former smoker followed for COPD, hypoxic respiratory failure/ tracheostomy, OSA, complicated by morbid obesity, OHS, DM, CHF -----OSA on BIPAP 22/18, DME: [facility] [place]; pt reports recent "split" in mask, needs new mask; pt would also like to receive flu shot today Body weight today 349 lbs Tracheostomy removed in January Uses Proventil HFA 3x/ week with exercise Room air sat on arrival 0000000 Dr Erik Obey / ENT evaluated in March, was concerned about tracheal stenosis, wanted to do CT chest and get [place] to do PFT. Download 63% compliance, AHI 8.7/ hr CT chest 07/05/2018-  discussed IMPRESSION: 1. Thyroid goiter with compression of the trachea could be contributory to the patient's symptoms. 2. No worrisome pulmonary lesions or acute pulmonary findings. Mild stable emphysematous changes but no interstitial lung disease or bronchiectasis. 3. Stable scattered mediastinal and hilar lymph nodes but no mass or adenopathy. Aortic Atherosclerosis (ICD10-I70.0) and Emphysema (ICD10-J43.9).  01/24/19-  59 year old male former smoker followed for COPD, hypoxic respiratory failure/ tracheostomy, OSA, complicated by morbid obesity, OHS, DM, CHF/ atherosclerosis, Goiter,  BIPAP 22/18/ APS Download compliance 63%, AHI 5.5/ hr Body weight today 354 lbs Needs new  mask- patching together. Reviewed download and discussed compliance. Easy dyspnea walking short distances with no pedal edema. Wheezes- out of inhaled meds. Discussed alternatives. Discussed weight and discussed Covid vaccine. He is afraid to leave his apartment.  He shows me a 2 cm nodule at base of spine/ gluteal cleft that someone "burned off once, but it came back, and sometimes bleeds". PFT 10/11/2018- Severe obstruction with response to dilator. [place] restriction, diffusion moderately reduced. FVC 2.16/ 57%, FEV1 1.77/ 59%, Ratio 0.82, TLC 66%, DLCO 66%  ROS-see HPI    + = positive Constitutional:    weight loss, night sweats, fevers, chills, + fatigue, lassitude. HEENT:   No-  headaches, difficulty swallowing, tooth/dental problems, sore throat,      sneezing, itching, ear ache, nasal congestion, post nasal drip,  CV:  No-   chest pain, +orthopnea, PND, swelling in lower extremities, no-anasarca, dizziness, palpitations Resp: +shortness of breath with exertion or at rest.                productive cough,  + non-productive cough,  No- coughing up of blood.              No-   change in color of mucus.   wheezing.   Skin: No-   rash or lesions. GI:  No-   heartburn, indigestion, abdominal pain,no- nausea, vomiting,  GU:  MS:  + joint pain or swelling.  . Neuro-     nothing unusual Psych:  No- change in mood or affect. No depression or anxiety.  No memory loss.  OBJ- Physical Exam General- Alert, Oriented, Affect-appropriate, Distress- none acute,  + morbidly obese,   . Skin- +1-2 cm nodule at tip of spine Lymphadenopathy- none Head- atraumatic            Eyes- Gross vision intact, PERRLA, conjunctivae and secretions  clear            Ears- Hearing, canals-normal            Nose- Clear, no-Septal dev, mucus, polyps, erosion, perforation             Throat- Mallampati IV , mucosa clear , drainage- none, tonsils- atrophic,                     +Stridorous resp                    +  Shallow consistent with body habitus,  Neck- + Breathing unlabored at rest on room air. Healed tracheostomy scar. Chest - symmetrical excursion , unlabored           Heart/CV- RRR, distant , no murmur , no gallop  , no rub, nl s1 s2                           - JVD- none , edema-none, stasis changes- none, varices- none           Lung- diminished/clear , wheeze- none, cough-none , dullness-none, rub- none                                   Chest wall-  Abd-  Br/ Gen/ Rectal- Not done, not indicated Extrem- cyanosis- none, clubbing, none, atrophy- none, strength- nl Neuro- grossly intact to observation

## 2019-01-24 NOTE — Assessment & Plan Note (Signed)
Weight is slowly drifting up. Discussed importance of stopping this drift. Emphasized diet control and walking for exercise.

## 2019-01-24 NOTE — Assessment & Plan Note (Signed)
Benefits from BIPAP. Discussed compliance goals. Plan- replace worn mask and supplies. Continue BIPAP 22/18

## 2019-01-24 NOTE — Assessment & Plan Note (Signed)
PFT shows reversible component Plan- Symbicort 160

## 2019-03-03 ENCOUNTER — Other Ambulatory Visit: Payer: Self-pay

## 2019-03-03 ENCOUNTER — Inpatient Hospital Stay (HOSPITAL_COMMUNITY)
Admission: EM | Admit: 2019-03-03 | Discharge: 2019-03-06 | DRG: 638 | Disposition: A | Discharge status: Home / Self Care | Payer: Medicaid Other | Attending: Student in an Organized Health Care Education/Training Program | Admitting: Student in an Organized Health Care Education/Training Program

## 2019-03-03 ENCOUNTER — Emergency Department (HOSPITAL_COMMUNITY): Payer: Medicaid Other

## 2019-03-03 ENCOUNTER — Encounter (HOSPITAL_COMMUNITY): Payer: Self-pay

## 2019-03-03 DIAGNOSIS — E871 Hypo-osmolality and hyponatremia: Secondary | ICD-10-CM | POA: Diagnosis present

## 2019-03-03 DIAGNOSIS — Z833 Family history of diabetes mellitus: Secondary | ICD-10-CM

## 2019-03-03 DIAGNOSIS — N179 Acute kidney failure, unspecified: Secondary | ICD-10-CM | POA: Diagnosis present

## 2019-03-03 DIAGNOSIS — Z794 Long term (current) use of insulin: Secondary | ICD-10-CM | POA: Diagnosis not present

## 2019-03-03 DIAGNOSIS — Z87891 Personal history of nicotine dependence: Secondary | ICD-10-CM | POA: Diagnosis not present

## 2019-03-03 DIAGNOSIS — E876 Hypokalemia: Secondary | ICD-10-CM | POA: Diagnosis present

## 2019-03-03 DIAGNOSIS — E049 Nontoxic goiter, unspecified: Secondary | ICD-10-CM | POA: Diagnosis not present

## 2019-03-03 DIAGNOSIS — Z6841 Body Mass Index (BMI) 40.0 and over, adult: Secondary | ICD-10-CM | POA: Diagnosis not present

## 2019-03-03 DIAGNOSIS — I5022 Chronic systolic (congestive) heart failure: Secondary | ICD-10-CM | POA: Diagnosis present

## 2019-03-03 DIAGNOSIS — E860 Dehydration: Secondary | ICD-10-CM | POA: Diagnosis present

## 2019-03-03 DIAGNOSIS — E1110 Type 2 diabetes mellitus with ketoacidosis without coma: Secondary | ICD-10-CM | POA: Diagnosis present

## 2019-03-03 DIAGNOSIS — Z7951 Long term (current) use of inhaled steroids: Secondary | ICD-10-CM | POA: Diagnosis not present

## 2019-03-03 DIAGNOSIS — E1100 Type 2 diabetes mellitus with hyperosmolarity without nonketotic hyperglycemic-hyperosmolar coma (NKHHC): Secondary | ICD-10-CM | POA: Diagnosis present

## 2019-03-03 DIAGNOSIS — I10 Essential (primary) hypertension: Secondary | ICD-10-CM

## 2019-03-03 DIAGNOSIS — Z20822 Contact with and (suspected) exposure to covid-19: Secondary | ICD-10-CM | POA: Diagnosis present

## 2019-03-03 DIAGNOSIS — Z9981 Dependence on supplemental oxygen: Secondary | ICD-10-CM | POA: Diagnosis not present

## 2019-03-03 DIAGNOSIS — I110 Hypertensive heart disease with heart failure: Secondary | ICD-10-CM | POA: Diagnosis present

## 2019-03-03 DIAGNOSIS — J449 Chronic obstructive pulmonary disease, unspecified: Secondary | ICD-10-CM | POA: Diagnosis present

## 2019-03-03 DIAGNOSIS — I5020 Unspecified systolic (congestive) heart failure: Secondary | ICD-10-CM | POA: Diagnosis not present

## 2019-03-03 DIAGNOSIS — Z79899 Other long term (current) drug therapy: Secondary | ICD-10-CM | POA: Diagnosis not present

## 2019-03-03 DIAGNOSIS — R739 Hyperglycemia, unspecified: Secondary | ICD-10-CM

## 2019-03-03 DIAGNOSIS — G4733 Obstructive sleep apnea (adult) (pediatric): Secondary | ICD-10-CM | POA: Diagnosis present

## 2019-03-03 DIAGNOSIS — E785 Hyperlipidemia, unspecified: Secondary | ICD-10-CM

## 2019-03-03 DIAGNOSIS — I509 Heart failure, unspecified: Secondary | ICD-10-CM | POA: Diagnosis not present

## 2019-03-03 DIAGNOSIS — J9611 Chronic respiratory failure with hypoxia: Secondary | ICD-10-CM | POA: Diagnosis present

## 2019-03-03 DIAGNOSIS — E662 Morbid (severe) obesity with alveolar hypoventilation: Secondary | ICD-10-CM | POA: Diagnosis present

## 2019-03-03 DIAGNOSIS — E119 Type 2 diabetes mellitus without complications: Secondary | ICD-10-CM

## 2019-03-03 DIAGNOSIS — E1165 Type 2 diabetes mellitus with hyperglycemia: Secondary | ICD-10-CM | POA: Diagnosis not present

## 2019-03-03 DIAGNOSIS — Z23 Encounter for immunization: Secondary | ICD-10-CM

## 2019-03-03 LAB — HEPATIC FUNCTION PANEL
ALT: 32 U/L (ref 0–44)
AST: 49 U/L — ABNORMAL HIGH (ref 15–41)
Albumin: 3.6 g/dL (ref 3.5–5.0)
Alkaline Phosphatase: 104 U/L (ref 38–126)
Bilirubin, Direct: 0.7 mg/dL — ABNORMAL HIGH (ref 0.0–0.2)
Indirect Bilirubin: 2.1 mg/dL — ABNORMAL HIGH (ref 0.3–0.9)
Total Bilirubin: 2.8 mg/dL — ABNORMAL HIGH (ref 0.3–1.2)
Total Protein: 7.9 g/dL (ref 6.5–8.1)

## 2019-03-03 LAB — CBC
HCT: 47.5 % (ref 39.0–52.0)
Hemoglobin: 15.8 g/dL (ref 13.0–17.0)
MCH: 28.5 pg (ref 26.0–34.0)
MCHC: 33.3 g/dL (ref 30.0–36.0)
MCV: 85.6 fL (ref 80.0–100.0)
Platelets: 243 10*3/uL (ref 150–400)
RBC: 5.55 MIL/uL (ref 4.22–5.81)
RDW: 14.4 % (ref 11.5–15.5)
WBC: 5.8 10*3/uL (ref 4.0–10.5)
nRBC: 0 % (ref 0.0–0.2)

## 2019-03-03 LAB — POCT I-STAT 7, (LYTES, BLD GAS, ICA,H+H)
Acid-Base Excess: 2 mmol/L (ref 0.0–2.0)
Bicarbonate: 26.4 mmol/L (ref 20.0–28.0)
Calcium, Ion: 1.09 mmol/L — ABNORMAL LOW (ref 1.15–1.40)
HCT: 45 % (ref 39.0–52.0)
Hemoglobin: 15.3 g/dL (ref 13.0–17.0)
O2 Saturation: 96 %
Patient temperature: 98
Potassium: 3 mmol/L — ABNORMAL LOW (ref 3.5–5.1)
Sodium: 129 mmol/L — ABNORMAL LOW (ref 135–145)
TCO2: 28 mmol/L (ref 22–32)
pCO2 arterial: 37.9 mmHg (ref 32.0–48.0)
pH, Arterial: 7.451 — ABNORMAL HIGH (ref 7.350–7.450)
pO2, Arterial: 80 mmHg — ABNORMAL LOW (ref 83.0–108.0)

## 2019-03-03 LAB — POCT I-STAT EG7
Acid-Base Excess: 8 mmol/L — ABNORMAL HIGH (ref 0.0–2.0)
Bicarbonate: 30.6 mmol/L — ABNORMAL HIGH (ref 20.0–28.0)
Calcium, Ion: 0.93 mmol/L — ABNORMAL LOW (ref 1.15–1.40)
HCT: 49 % (ref 39.0–52.0)
Hemoglobin: 16.7 g/dL (ref 13.0–17.0)
O2 Saturation: 91 %
Potassium: 4.4 mmol/L (ref 3.5–5.1)
Sodium: 125 mmol/L — ABNORMAL LOW (ref 135–145)
TCO2: 32 mmol/L (ref 22–32)
pCO2, Ven: 36.4 mmHg — ABNORMAL LOW (ref 44.0–60.0)
pH, Ven: 7.532 — ABNORMAL HIGH (ref 7.250–7.430)
pO2, Ven: 53 mmHg — ABNORMAL HIGH (ref 32.0–45.0)

## 2019-03-03 LAB — BASIC METABOLIC PANEL
Anion gap: 24 — ABNORMAL HIGH (ref 5–15)
Anion gap: 24 — ABNORMAL HIGH (ref 5–15)
Anion gap: 25 — ABNORMAL HIGH (ref 5–15)
BUN: 24 mg/dL — ABNORMAL HIGH (ref 6–20)
BUN: 24 mg/dL — ABNORMAL HIGH (ref 6–20)
BUN: 25 mg/dL — ABNORMAL HIGH (ref 6–20)
CO2: 23 mmol/L (ref 22–32)
CO2: 22 mmol/L (ref 22–32)
CO2: 19 mmol/L — ABNORMAL LOW (ref 22–32)
Calcium: 9 mg/dL (ref 8.9–10.3)
Calcium: 8.9 mg/dL (ref 8.9–10.3)
Calcium: 8.8 mg/dL — ABNORMAL LOW (ref 8.9–10.3)
Chloride: 80 mmol/L — ABNORMAL LOW (ref 98–111)
Chloride: 84 mmol/L — ABNORMAL LOW (ref 98–111)
Chloride: 88 mmol/L — ABNORMAL LOW (ref 98–111)
Creatinine, Ser: 2.09 mg/dL — ABNORMAL HIGH (ref 0.61–1.24)
Creatinine, Ser: 1.95 mg/dL — ABNORMAL HIGH (ref 0.61–1.24)
Creatinine, Ser: 1.71 mg/dL — ABNORMAL HIGH (ref 0.61–1.24)
GFR calc Af Amer: 40 mL/min — ABNORMAL LOW (ref >60)
GFR calc Af Amer: 43 mL/min — ABNORMAL LOW (ref >60)
GFR calc Af Amer: 50 mL/min — ABNORMAL LOW (ref >60)
GFR calc non Af Amer: 34 mL/min — ABNORMAL LOW (ref >60)
GFR calc non Af Amer: 37 mL/min — ABNORMAL LOW (ref >60)
GFR calc non Af Amer: 43 mL/min — ABNORMAL LOW (ref >60)
Glucose, Bld: 727 mg/dL (ref 70–99)
Glucose, Bld: 646 mg/dL (ref 70–99)
Glucose, Bld: 373 mg/dL — ABNORMAL HIGH (ref 70–99)
Potassium: 5.2 mmol/L — ABNORMAL HIGH (ref 3.5–5.1)
Potassium: 3.6 mmol/L (ref 3.5–5.1)
Potassium: 4.3 mmol/L (ref 3.5–5.1)
Sodium: 127 mmol/L — ABNORMAL LOW (ref 135–145)
Sodium: 130 mmol/L — ABNORMAL LOW (ref 135–145)
Sodium: 132 mmol/L — ABNORMAL LOW (ref 135–145)

## 2019-03-03 LAB — RESPIRATORY PANEL BY RT PCR (FLU A&B, COVID)
Influenza A by PCR: NEGATIVE
Influenza B by PCR: NEGATIVE
SARS Coronavirus 2 by RT PCR: NEGATIVE

## 2019-03-03 LAB — URINALYSIS, ROUTINE W REFLEX MICROSCOPIC
Bacteria, UA: NONE SEEN
Bilirubin Urine: NEGATIVE
Glucose, UA: >=500 mg/dL — AB
Hgb urine dipstick: NEGATIVE
Ketones, ur: 20 mg/dL — AB
Leukocytes,Ua: NEGATIVE
Nitrite: NEGATIVE
Protein, ur: NEGATIVE mg/dL
Specific Gravity, Urine: 1.024 (ref 1.005–1.030)
pH: 6 (ref 5.0–8.0)

## 2019-03-03 LAB — CBG MONITORING, ED
Glucose-Capillary: >600 mg/dL (ref 70–99)
Glucose-Capillary: >600 mg/dL (ref 70–99)
Glucose-Capillary: 568 mg/dL (ref 70–99)

## 2019-03-03 LAB — GLUCOSE, CAPILLARY
Glucose-Capillary: 455 mg/dL — ABNORMAL HIGH (ref 70–99)
Glucose-Capillary: 402 mg/dL — ABNORMAL HIGH (ref 70–99)
Glucose-Capillary: 392 mg/dL — ABNORMAL HIGH (ref 70–99)
Glucose-Capillary: 348 mg/dL — ABNORMAL HIGH (ref 70–99)

## 2019-03-03 LAB — HIV ANTIBODY (ROUTINE TESTING W REFLEX): HIV Screen 4th Generation wRfx: NONREACTIVE

## 2019-03-03 LAB — BETA-HYDROXYBUTYRIC ACID: Beta-Hydroxybutyric Acid: >8 mmol/L — ABNORMAL HIGH (ref 0.05–0.27)

## 2019-03-03 LAB — OSMOLALITY: Osmolality: 315 mOsm/kg — ABNORMAL HIGH (ref 275–295)

## 2019-03-03 LAB — MRSA PCR SCREENING: MRSA by PCR: NEGATIVE

## 2019-03-03 MED ORDER — DEXTROSE-NACL 5-0.45 % IV SOLN: INTRAVENOUS | Status: DC

## 2019-03-03 MED ORDER — SODIUM CHLORIDE 0.9 % IV SOLN: INTRAVENOUS | Status: DC

## 2019-03-03 MED ORDER — INSULIN REGULAR(HUMAN) IN NACL 100-0.9 UT/100ML-% IV SOLN
INTRAVENOUS | Status: DC
  Administered 2019-03-03: 16 [IU]/h via INTRAVENOUS
  Administered 2019-03-04: 6.5 [IU]/h via INTRAVENOUS
  Filled 2019-03-03 (×4): qty 100

## 2019-03-03 MED ORDER — POTASSIUM CHLORIDE CRYS ER 20 MEQ PO TBCR: 40.0000 meq | EXTENDED_RELEASE_TABLET | Freq: Two times a day (BID) | ORAL | Status: DC

## 2019-03-03 MED ORDER — ENOXAPARIN SODIUM 80 MG/0.8ML ~~LOC~~ SOLN
75.0000 mg | SUBCUTANEOUS | Status: DC
  Administered 2019-03-03 – 2019-03-05 (×3): 75 mg via SUBCUTANEOUS
  Filled 2019-03-03 (×2): qty 0.8
  Filled 2019-03-03: qty 0.75

## 2019-03-03 MED ORDER — POTASSIUM CHLORIDE 20 MEQ/15ML (10%) PO SOLN
40.0000 meq | Freq: Once | ORAL | Status: AC
  Administered 2019-03-03: 40 meq via ORAL
  Filled 2019-03-03: qty 30

## 2019-03-03 MED ORDER — INSULIN REGULAR(HUMAN) IN NACL 100-0.9 UT/100ML-% IV SOLN
INTRAVENOUS | Status: DC
  Administered 2019-03-03: 13 [IU]/h via INTRAVENOUS
  Filled 2019-03-03: qty 100

## 2019-03-03 MED ORDER — POTASSIUM CHLORIDE 20 MEQ/15ML (10%) PO SOLN: 20.0000 meq | Freq: Once | ORAL | Status: DC

## 2019-03-03 MED ORDER — DEXTROSE 50 % IV SOLN: 0.0000 mL | INTRAVENOUS | Status: DC | PRN

## 2019-03-03 MED ORDER — LACTATED RINGERS IV BOLUS: 1000.0000 mL | INTRAVENOUS | Status: AC

## 2019-03-03 MED ORDER — POTASSIUM CHLORIDE 10 MEQ/100ML IV SOLN: 10.0000 meq | INTRAVENOUS | Status: DC

## 2019-03-03 MED ORDER — SERTRALINE HCL 50 MG PO TABS
50.0000 mg | ORAL_TABLET | Freq: Every day | ORAL | Status: DC
  Administered 2019-03-04 – 2019-03-06 (×3): 50 mg via ORAL
  Filled 2019-03-03 (×3): qty 1

## 2019-03-03 MED ORDER — SODIUM CHLORIDE 0.9 % IV BOLUS
1000.0000 mL | Freq: Once | INTRAVENOUS | Status: AC
  Administered 2019-03-03: 1000 mL via INTRAVENOUS

## 2019-03-03 MED ORDER — POTASSIUM CHLORIDE IN NACL 20-0.9 MEQ/L-% IV SOLN
INTRAVENOUS | Status: DC
  Filled 2019-03-03 (×3): qty 1000

## 2019-03-03 MED ORDER — INFLUENZA VAC SPLIT QUAD 0.5 ML IM SUSY
0.5000 mL | PREFILLED_SYRINGE | INTRAMUSCULAR | Status: AC
  Administered 2019-03-04: 0.5 mL via INTRAMUSCULAR
  Filled 2019-03-03: qty 0.5

## 2019-03-03 MED ORDER — LACTATED RINGERS IV BOLUS: 1000.0000 mL | Freq: Once | INTRAVENOUS | Status: DC

## 2019-03-03 MED ORDER — MOMETASONE FURO-FORMOTEROL FUM 200-5 MCG/ACT IN AERO
2.0000 | INHALATION_SPRAY | Freq: Two times a day (BID) | RESPIRATORY_TRACT | Status: DC
  Administered 2019-03-04 – 2019-03-06 (×4): 2 via RESPIRATORY_TRACT
  Filled 2019-03-03: qty 8.8

## 2019-03-03 NOTE — Progress Notes (Signed)
I admitted this  patient earlier this evening for likely DKA in the setting of hyperglycemia and elevated beta hydroxybutyrate.  Initiated DKA protocol including  IV fluids, insulin, and repleting potassium.  An ABG was ordered to assess the degree of acidosis in the setting of an anion gap of 24.  Upon reviewing labs from the ED, patient was found to have potassium of 3.0.  I went to personally evaluate the patient and instructed the nurse to discontinue the IV insulin and replete the potassium with oral and IV potassium and continue IV fluids. Unfortunately potassium has not been sent by pharmacy, which is prohibited the RN from repleting the neccessary electrolytes. More over the ABG has still has not been collected. We will continue to follow to ensure that the patient can get the appropriate care in a timely fashion.  Marianna Payment, D.O. Date 03/03/2019 Time 7:01 PM [facility] Internal Medicine, PGY-1 Pager: [phone]

## 2019-03-03 NOTE — Plan of Care (Signed)

## 2019-03-03 NOTE — ED Provider Notes (Signed)
[facility] EMERGENCY DEPARTMENT Provider Note   CSN: [id number] Arrival date & time: 03/03/19  1307     History Chief Complaint  Patient presents with  . Hyperglycemia    Timothy Le is a 59 y.o. male.  HPI Patient presents feeling bad.  Body aches decreased appetite increased urination.  States the battery in his glucose meter is dead but he thinks it is sugar is high.  States he has been still taking his insulin.  No sick contacts.  States he feels fatigued.  No dysuria.  Has had some nausea.  Has had some vomiting.  No abdominal pain but states he does ache all over.  EMS CBG was 377 although it was over 600 upon arrival.    Past Medical History:  Diagnosis Date  . Asthma   . Chronic respiratory failure with hypoxia ([place]) 07/09/2017  . COPD (chronic obstructive pulmonary disease) ([place])   . Diabetes mellitus without complication ([place])   . Difficult intubation   . Hypertension   . Shortness of breath dyspnea   . Sleep apnea     Patient Active Problem List   Diagnosis Date Noted  . Nodule of soft tissue 01/24/2019  . 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 Subacute stridor   . Transaminitis   . Super-super obese ([place])   . Benign essential HTN   . Hyperlipidemia   . PEG (percutaneous endoscopic gastrostomy) status ([place])   . Labile blood pressure   . Acute blood loss anemia   . Anemia of chronic disease   . Diabetes mellitus type 2 in obese ([place])   . Sacral wound   . Tracheostomy status ([place])   . Debility 07/15/2017  . Chronic respiratory failure with hypoxia ([place]) 07/09/2017  . Abscess of groin, left   . Encounter for PEG (percutaneous endoscopic gastrostomy) ([place])   . Acute pulmonary edema (HCC)   . HCAP (healthcare-associated pneumonia)   . ARDS (adult respiratory distress syndrome) ([place])   . Pressure injury of skin 06/19/2017  . Difficult airway for intubation   . Respiratory distress 06/09/2017  . Obesity  hypoventilation syndrome ([place]) 06/09/2017  . COPD with acute exacerbation ([place]) 06/17/2016  . Seasonal and perennial allergic rhinitis 05/24/2016  . AKI (acute kidney injury) ([place]) 04/08/2015  . Hypovolemic shock ([place]) 04/08/2015  . Sepsis ([place]) 04/08/2015  . Hyponatremia 04/08/2015  . HTN (hypertension) 03/28/2015  . Near syncope 03/28/2015  . Type 2 diabetes mellitus with hyperosmolar nonketotic hyperglycemia ([place]) 03/28/2015  . Hyperglycemia 03/27/2015  . Acute on chronic respiratory failure with hypoxemia ([place]) 03/27/2015  . Acute on chronic congestive heart failure ([place])   . COPD exacerbation ([place]) 02/07/2015  . Pressure ulcer 12/28/2014  . COPD mixed type ([place]) 12/27/2014  . Acute respiratory failure ([place]) 12/27/2014  . Class 1 obesity with alveolar hypoventilation and body mass index (BMI) of 31.0 to 31.9 in adult ([place]) 12/27/2014  . Diabetes mellitus type 2, controlled ([place]) 12/27/2014    Class: Chronic  . OSA (obstructive sleep apnea) 07/29/2013  . Peripheral edema 07/29/2013    Past Surgical History:  Procedure Laterality Date  . IR GASTROSTOMY TUBE MOD SED  07/05/2017  . TRACHEOSTOMY TUBE PLACEMENT N/A 06/17/2017   Procedure: TRACHEOSTOMY;  Surgeon: Leta Baptist, MD;  Location: [facility] OR;  Service: ENT;  Laterality: N/A;       Family History  Problem Relation Age of Onset  . Asthma Sister   . Asthma Other  nephew  . Hypertension Sister   . Diabetes type II Sister     Social History   Tobacco Use  . Smoking status: Former Smoker    Packs/day: 0.50    Years: 0.50    Pack years: 0.25    Types: Cigarettes    Start date: 07/28/1983    Quit date: 02/27/2013    Years since quitting: 6.0  . Smokeless tobacco: Never Used  Substance Use Topics  . Alcohol use: No    Comment: quit ETOH about 5 months ago-beer and liquor  . Drug use: No    Comment: quit 6 months-"pot"-smoked couple joints a week    Home Medications Prior to Admission medications   Medication Sig  Start Date End Date Taking? Authorizing Provider  acetaminophen (TYLENOL) 325 MG tablet Take 2 tablets (650 mg total) by mouth every 6 (six) hours as needed for mild pain or headache. 07/28/17   Angiulli, Lavon Paganini, PA-C  allopurinol (ZYLOPRIM) 100 MG tablet Take 100 mg by mouth daily.    [provider]  atorvastatin (LIPITOR) 10 MG tablet Take 10 mg by mouth daily.    [provider]  budesonide-formoterol [facility]) 160-4.5 MCG/ACT inhaler Inhale 2 puffs then rinse mouth, twice daiy 01/24/19   Baird Lyons D, MD  docusate sodium (COLACE) 100 MG capsule Take 1 capsule (100 mg total) by mouth 2 (two) times daily. 07/28/17   Angiulli, Lavon Paganini, PA-C  insulin glargine (LANTUS) 100 UNIT/ML injection Inject 0.1 mLs (10 Units total) into the skin at bedtime. 07/28/17   Angiulli, Lavon Paganini, PA-C  Insulin Pen Needle (BD ULTRA-FINE PEN NEEDLES) 29G X 12.7MM MISC USE AS DIRECTED WITH LANTUS 12/23/17   [provider]  metolazone (ZAROXOLYN) 2.5 MG tablet Take 2.5 mg by mouth daily.    [provider]  Multiple Vitamin (MULTIVITAMIN WITH MINERALS) TABS tablet Take 1 tablet by mouth daily. 07/29/17   Angiulli, Lavon Paganini, PA-C  Potassium Chloride ER 20 MEQ TBCR Take 1 tablet by mouth daily. 10/03/18   [provider]  potassium chloride SA (K-DUR,KLOR-CON) 20 MEQ tablet Take 1 tablet (20 mEq total) by mouth daily. 07/29/17   Angiulli, Lavon Paganini, PA-C  senna-docusate (SENOKOT-S) 8.6-50 MG tablet Take 2 tablets by mouth at bedtime. 07/28/17   Angiulli, Lavon Paganini, PA-C  sertraline (ZOLOFT) 50 MG tablet Take 50 mg by mouth daily.    [provider]  torsemide (DEMADEX) 10 MG tablet Place 1 tablet (10 mg total) into feeding tube daily. 07/16/17   Guadalupe Dawn, MD  vitamin C (VITAMIN C) 250 MG tablet Place 1 tablet (250 mg total) into feeding tube daily. 07/16/17   Guadalupe Dawn, MD    Allergies    Patient has no known allergies.  Review of Systems   Review of  Systems  Constitutional: Positive for appetite change and fatigue.  HENT: Negative for congestion.   Respiratory: Positive for shortness of breath.   Cardiovascular: Negative for chest pain.  Gastrointestinal: Negative for abdominal pain.  Genitourinary: Positive for frequency.  Musculoskeletal: Positive for myalgias.  Skin: Negative for wound.  Neurological: Positive for weakness.  Psychiatric/Behavioral: Negative for confusion.    Physical Exam Updated Vital Signs BP 107/60   Pulse 86   Temp 98.4 F (36.9 C) (Oral)   Resp (!) 21   Ht 5\' 8"  (1.727 m)   Wt (!) 156.5 kg   SpO2 94%   BMI 52.46 kg/m   Physical Exam Vitals reviewed.  HENT:  Head: Normocephalic.  Eyes:     Pupils: Pupils are equal, round, and reactive to light.  Cardiovascular:     Rate and Rhythm: Regular rhythm.  Pulmonary:     Comments: Mildly harsh breath sounds without focal rales or rhonchi. Abdominal:     Tenderness: There is no abdominal tenderness.  Musculoskeletal:     Right lower leg: Edema present.     Left lower leg: Edema present.  Skin:    General: Skin is warm.     Capillary Refill: Capillary refill takes less than 2 seconds.  Neurological:     Mental Status: He is alert. Mental status is at baseline.     ED Results / Procedures / Treatments   Labs (all labs ordered are listed, but only abnormal results are displayed) Labs Reviewed  BASIC METABOLIC PANEL - Abnormal; Notable for the following components:      Result Value   Sodium 127 (*)    Potassium 5.2 (*)    Chloride 80 (*)    Glucose, Bld 727 (*)    BUN 24 (*)    Creatinine, Ser 2.09 (*)    GFR calc non Af Amer 34 (*)    GFR calc Af Amer 40 (*)    Anion gap 24 (*)    All other components within normal limits  URINALYSIS, ROUTINE W REFLEX MICROSCOPIC - Abnormal; Notable for the following components:   Color, Urine STRAW (*)    Glucose, UA >=500 (*)    Ketones, ur 20 (*)    All other components within normal limits    HEPATIC FUNCTION PANEL - Abnormal; Notable for the following components:   AST 49 (*)    Total Bilirubin 2.8 (*)    Bilirubin, Direct 0.7 (*)    Indirect Bilirubin 2.1 (*)    All other components within normal limits  CBG MONITORING, ED - Abnormal; Notable for the following components:   Glucose-Capillary >600 (*)    All other components within normal limits  POCT I-STAT EG7 - Abnormal; Notable for the following components:   pH, Ven 7.532 (*)    pCO2, Ven 36.4 (*)    pO2, Ven 53.0 (*)    Bicarbonate 30.6 (*)    Acid-Base Excess 8.0 (*)    Sodium 125 (*)    Calcium, Ion 0.93 (*)    All other components within normal limits  RESPIRATORY PANEL BY RT PCR (FLU A&B, COVID)  CBC  CBG MONITORING, ED  I-STAT VENOUS BLOOD GAS, ED    EKG None  Radiology DG Chest Portable 1 View  Result Date: 03/03/2019 CLINICAL DATA:  Weakness.  Fever and cough. EXAM: PORTABLE CHEST 1 VIEW COMPARISON:  June 24, 2017 FINDINGS: The heart size and mediastinal contours are within normal limits. Both lungs are clear. The visualized skeletal structures are unremarkable. IMPRESSION: No active disease. Electronically Signed   By: Dorise Bullion III M.D   On: 03/03/2019 13:55    Procedures Procedures (including critical care time)  Medications Ordered in ED Medications  sodium chloride 0.9 % bolus 1,000 mL (1,000 mLs Intravenous New Bag/Given 03/03/19 1423)    ED Course  I have reviewed the triage vital signs and the nursing notes.  Pertinent labs & imaging results that were available during my care of the patient were reviewed by me and considered in my medical decision making (see chart for details).    MDM Rules/Calculators/A&P  Patient with hyperglycemia.  Sugar of 700.  Acute kidney injury.  Anion gap of 24 but normal pH.  Potentially mild DKA.  Appears to have issues with volume overload in the past.  Will admit to unassigned medicine.  CRITICAL CARE Performed by: Davonna Belling Total critical care time: 30 minutes Critical care time was exclusive of separately billable procedures and treating other patients. Critical care was necessary to treat or prevent imminent or life-threatening deterioration. Critical care was time spent personally by me on the following activities: development of treatment plan with patient and/or surrogate as well as nursing, discussions with consultants, evaluation of patient's response to treatment, examination of patient, obtaining history from patient or surrogate, ordering and performing treatments and interventions, ordering and review of laboratory studies, ordering and review of radiographic studies, pulse oximetry and re-evaluation of patient's condition.  Final Clinical Impression(s) / ED Diagnoses Final diagnoses:  Hyperglycemia  AKI (acute kidney injury) [facility])    Rx / [place] Orders ED Discharge Orders    None       Davonna Belling, MD 03/03/19 1457

## 2019-03-03 NOTE — Progress Notes (Signed)
Inpatient Diabetes Program Recommendations  AACE/ADA: New Consensus Statement on Inpatient Glycemic Control (2015)  Target Ranges:  Prepandial:   less than 140 mg/dL      Peak postprandial:   less than 180 mg/dL (1-2 hours)      Critically ill patients:  140 - 180 mg/dL   Results for Timothy Le, Timothy Le (MRN [id number]) as of 03/03/2019 16:11  Ref. Range 03/03/2019 13:55  Sodium Latest Ref Range: 135 - 145 mmol/L 127 (L)  Potassium Latest Ref Range: 3.5 - 5.1 mmol/L 5.2 (H)  Chloride Latest Ref Range: 98 - 111 mmol/L 80 (L)  CO2 Latest Ref Range: 22 - 32 mmol/L 23  Glucose Latest Ref Range: 70 - 99 mg/dL 727 (HH)  BUN Latest Ref Range: 6 - 20 mg/dL 24 (H)  Creatinine Latest Ref Range: 0.61 - 1.24 mg/dL 2.09 (H)  Calcium Latest Ref Range: 8.9 - 10.3 mg/dL 9.0  Anion gap Latest Ref Range: 5 - 15  24 (H)   Results for Timothy Le, Timothy Le (MRN [id number]) as of 03/03/2019 16:11  Ref. Range 03/03/2019 13:18 03/03/2019 15:59  Glucose-Capillary Latest Ref Range: 70 - 99 mg/dL >600 (HH) >600 (HH)  IV Insulin Drip started    Admit with: Hyperglycemia/ Acute Kidney Injury  History: DM, COPD  Home DM Meds: Lantus 10 units QHS  Current Orders: IV Insulin Drip     PCP:??  Told ED MD that the battery in his CBG meter is dead but that he has still been taking his insulin.    Glucose 727 mg/dl on admit.  IV Insulin Drip started at 4pm today.  Current A1c level pending.    --Will follow patient during hospitalization--  Wyn Quaker RN, MSN, CDE Diabetes Coordinator Inpatient Glycemic Control Team Team Pager: [phone] (8a-5p)

## 2019-03-03 NOTE — H&P (Addendum)
Date: 03/03/2019               Patient Name:  Timothy Le MRN: [id number]  DOB: [birth year] Age / Sex: 59 y.o., male   PCP: Kerin Perna, NP         Medical Service: Internal Medicine Teaching Service         Attending Physician: Dr. Evette Doffing, Mallie Mussel, *    First Contact: Dr. Marianna Payment Pager: [phone]  Second Contact: Dr. Maricela Bo Pager: [phone]       After Hours (After 5p/  First Contact Pager: [phone]  weekends / holidays): Second Contact Pager: [phone]   Chief Complaint: Hyperglycemia  History of Present Illness: Timothy Le is a 59 y.o. male with a pertinent past medical history of HTN, thyroid goiter w/ trachea compression, OSA/OHA (CPAP), COPD (w/o O2), obesity, HLD, IDDM, who presents with a 3 day history of "feeling like he got hit buy a car". He has had associated fatigue, generalized weakness, polydipsia, and polyuria. He has had significant nausea and non-bloody vomiting that limited PO intact during this time. He is on insulin for DM, but his glucometer has been out of batteries for roughly 6 months. He states that he continues to take his insulin with occasional missed doses. He admits to shortness of breath with/without exertion with chronic cough. This has been present since his last admission for acute hypoxic/hypercarbic respiratory failure 2/2 severe OSA/OHS with concern for volume overload without CHF on 06/09/2017. During this admission, he required prolonged intubation with tracheotomy and PEG tube. Echocardiogram on 2019 showed an EF of 55-60% with poor visualizations due to body habitus. In the past when he felt like this before, he would lay down and drink lots of water and it would go away. He states that he has previously been hospitalized for hyperglycemia, but does not remember when. He denies any precipitating factors, including chest pain, fever/chills, dysuria, chronic wounds, illicit drug uses, changes in medications, or sick contacts.   In the  ED the patient was found to be hyperglycemic with a CBG > 600, ketonuria, and an anion gap of 24. He was given a liter of NS fluids.   Social:  Lives in [place] by himself.  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  He does have family in the area including 6 sisters He uses [employer] for transportation. Has a difficult time performing ADLs at home 2/2 gait instability/problems ambulating. He uses objects around his house to stabilizer himself. Does have a walker at home, but no wheelchair. He has a 10 pack year history of tobacco use (quit cigarettes sin 2015), occasional ETOH use, and no illicit drugs.   Family History:  Father - T1DM, HTN, Heart Attack Mom - "stomach cancer" 3/6 sister have a history of DM  Meds:  Current Meds  Medication Sig   allopurinol (ZYLOPRIM) 100 MG tablet Take 100 mg by mouth daily.   atorvastatin (LIPITOR) 10 MG tablet Take 10 mg by mouth daily.   budesonide-formoterol (SYMBICORT) 160-4.5 MCG/ACT inhaler Inhale 2 puffs then rinse mouth, twice daiy (Patient taking differently: Inhale 1 puff into the lungs 2 (two) times daily. Rinse mouth after use)   desonide (DESOWEN) 0.05 % lotion Apply 1 application topically daily. Apply to face   insulin glargine (LANTUS) 100 UNIT/ML injection Inject 0.1 mLs (10 Units total) into the skin at bedtime. (Patient taking differently: Inject 30 Units  into the skin at bedtime. )   ketoconazole (NIZORAL) 2 % cream Apply 1 application topically daily. Apply to wounds   metolazone (ZAROXOLYN) 2.5 MG tablet Take 2.5 mg by mouth daily.   potassium chloride SA (K-DUR,KLOR-CON) 20 MEQ tablet Take 1 tablet (20 mEq total) by mouth daily.   sertraline (ZOLOFT) 100 MG tablet Take 100 mg by mouth daily.    torsemide (DEMADEX) 5 MG tablet Take 5 mg by mouth daily.     Allergies: Allergies as of 03/03/2019   (No Known Allergies)   Past Medical History:  Diagnosis Date   Asthma    Chronic  respiratory failure with hypoxia ([place]) 07/09/2017   COPD (chronic obstructive pulmonary disease) (HCC)    Diabetes mellitus without complication ([place])    Difficult intubation    Hypertension    Shortness of breath dyspnea    Sleep apnea      Review of Systems: A complete ROS was negative except as per HPI.   Physical Exam: Blood pressure 109/78, pulse 84, temperature 98.4 F (36.9 C), temperature source Oral, resp. rate (!) 23, height 5\' 8"  (1.727 m), weight (!) 156.5 kg, SpO2 96 %. Physical Exam  Constitutional: He is oriented to person, place, and time and well-developed, well-nourished, and in no distress.  HENT:  Head: Normocephalic and atraumatic.  Dry flaky perioral skin and oral mucous membranes  Eyes: EOM are normal.  Neck: No JVD present.  Cardiovascular: Normal rate, regular rhythm, normal heart sounds and intact distal pulses. Exam reveals no gallop and no friction rub.  No murmur heard. Pulmonary/Chest: No accessory muscle usage. Tachypnea noted. No respiratory distress.  Abdominal: Soft. He exhibits no distension. There is no abdominal tenderness.  Musculoskeletal:        General: No tenderness or edema.     Cervical back: Normal range of motion.     Comments: Dry flaky skin bilateral LE  Neurological: He is alert and oriented to person, place, and time.  Skin: Skin is warm and dry.  Decreased capillary refill   CBC    Component Value Date/Time   WBC 5.8 03/03/2019 1355   RBC 5.55 03/03/2019 1355   HGB 15.3 03/03/2019 1634   HCT 45.0 03/03/2019 1634   PLT 243 03/03/2019 1355   MCV 85.6 03/03/2019 1355   MCH 28.5 03/03/2019 1355   MCHC 33.3 03/03/2019 1355   RDW 14.4 03/03/2019 1355   LYMPHSABS 2.7 07/23/2017 0653   MONOABS 0.6 07/23/2017 0653   EOSABS 0.2 07/23/2017 0653   BASOSABS 0.0 07/23/2017 0653   CMP     Component Value Date/Time   NA 129 (L) 03/03/2019 1634   K 3.0 (L) 03/03/2019 1634   CL 84 (L) 03/03/2019 1617   CO2 22 03/03/2019 1617    GLUCOSE 646 (HH) 03/03/2019 1617   BUN 24 (H) 03/03/2019 1617   CREATININE 1.95 (H) 03/03/2019 1617   CALCIUM 8.9 03/03/2019 1617   PROT 7.9 03/03/2019 1355   ALBUMIN 3.6 03/03/2019 1355   AST 49 (H) 03/03/2019 1355   ALT 32 03/03/2019 1355   ALKPHOS 104 03/03/2019 1355   BILITOT 2.8 (H) 03/03/2019 1355   GFRNONAA 37 (L) 03/03/2019 1617   GFRAA 43 (L) 03/03/2019 1617   UA: >500 glucose, > 20 Ketone Beta-hydroxybuterate: >8,000  EKG: personally reviewed my interpretation is sinus rhythm with QT prolongation   CXR: personally reviewed my interpretation is WNL  Assessment & Plan by Problem: Principal Problem:   Hyperosmolar hyperglycemic state (HHS) (  [place]) Active Problems:   OSA (obstructive sleep apnea)   Severe obesity (BMI >= 40) (HCC)   Diabetes mellitus type 2, controlled ([place])   AKI (acute kidney injury) [facility])  Mr. Wilmoth is a 59 y.o. male with a pertinent PMH of HTN, thyroid goiter w/ trachea compression, OSA/OHA (CPAP), COPD (w/o O2), obesity, HLD, IDDM, who presents with a 3 day history of polydipsia, polyuria, with nausea/vomitting and hyperglycemia concerning for DKA.   #HHS: #Diabetes Mellitus Type 2: Patient presents with sign and symptoms concerning for hyperglycemia with a blood glucose in the 700's. Urinalysis is positive for glucose and ketones, anion gap of 24 and a positive Beta-hydroxybuterate. Home insulin regimen consists of Lantus 10 units daily  - Hgb A1C pending  - ABG pending - Start Regular Insulin, NS IV fluids, and K replacement - Repeat BMP every 4 hours  #Hypokalemia: - Repleat potassium with oral and IV K+  #AKI: Patient has a creatinine of 2.09 and a GFR of 40 with a baseline of 0.89 and >60. This is likely due to prerenal azotemia in the setting of hyperglycemic natruresis. - Patient receiving IV fluids NS 75 cc/hr with KCl  #Hypertonic Hyponatremia: Patient presented with a plasma sodium of 127 that corrected to 137 with his glucose  elevated at 727 mg/dL.  #COPD: Gold C COPD with previous PFTs showing severe obstruction with response to bronchodilators, Severe obstruction with response to dilator. Severe restriction, diffusion moderately reduced. FVC 2.16/ 57%, FEV1 1.77/ 59%, Ratio 0.82, TLC 66%, DLCO 66% - Continue Dulera 200-5 mcg/act  #HLD: - Continue atorvastatin 10 mg  Diet: NPO VTE: Enoxaparin  IVF: NS 75 cc/hr Code: Full code  Dispo: Admit patient to Inpatient with expected length of stay greater than 2 midnights.  Signed: Marianna Payment, MD 03/03/2019, 5:25 PM  Pager: [phone]

## 2019-03-03 NOTE — ED Triage Notes (Signed)
Pt arrived via [facility] from home for c/o body achesx3 days, low appetitex2days, polydipsia and polyuriax2days. Pt states he feels dizzy upon standing. SOB w/exhertion. Pt is A&Ox4. Pt is NSR on monitor. VS WNL.

## 2019-03-03 NOTE — Progress Notes (Signed)
Pt. Admitted from ED on insulin drip for DKA. Doctor came to room and stopped insulin drip due to low potassium. Stat ABG and K+ ordered. Pharmacy called and asked to verify ordered but they advised RN that the needed to reconcile home meds first. Doctors returned to floor inquiring as to why ABG's not drawn and meds not given. Doctors calling pharmacy and respiratory to express the urgency in these procedures and medications for this patient.

## 2019-03-04 ENCOUNTER — Encounter (HOSPITAL_COMMUNITY): Payer: Self-pay | Admitting: Student in an Organized Health Care Education/Training Program

## 2019-03-04 DIAGNOSIS — I509 Heart failure, unspecified: Secondary | ICD-10-CM

## 2019-03-04 DIAGNOSIS — I110 Hypertensive heart disease with heart failure: Secondary | ICD-10-CM

## 2019-03-04 LAB — HEMOGLOBIN A1C
Hgb A1c MFr Bld: 16.6 % — ABNORMAL HIGH (ref 4.8–5.6)
Mean Plasma Glucose: 429.72 mg/dL

## 2019-03-04 LAB — BASIC METABOLIC PANEL
Anion gap: 17 — ABNORMAL HIGH (ref 5–15)
Anion gap: 17 — ABNORMAL HIGH (ref 5–15)
Anion gap: 17 — ABNORMAL HIGH (ref 5–15)
Anion gap: 11 (ref 5–15)
Anion gap: 12 (ref 5–15)
BUN: 22 mg/dL — ABNORMAL HIGH (ref 6–20)
BUN: 21 mg/dL — ABNORMAL HIGH (ref 6–20)
BUN: 20 mg/dL (ref 6–20)
BUN: 15 mg/dL (ref 6–20)
BUN: 13 mg/dL (ref 6–20)
CO2: 28 mmol/L (ref 22–32)
CO2: 27 mmol/L (ref 22–32)
CO2: 28 mmol/L (ref 22–32)
CO2: 29 mmol/L (ref 22–32)
CO2: 27 mmol/L (ref 22–32)
Calcium: 9.3 mg/dL (ref 8.9–10.3)
Calcium: 8.9 mg/dL (ref 8.9–10.3)
Calcium: 9.1 mg/dL (ref 8.9–10.3)
Calcium: 8.6 mg/dL — ABNORMAL LOW (ref 8.9–10.3)
Calcium: 8.4 mg/dL — ABNORMAL LOW (ref 8.9–10.3)
Chloride: 93 mmol/L — ABNORMAL LOW (ref 98–111)
Chloride: 94 mmol/L — ABNORMAL LOW (ref 98–111)
Chloride: 93 mmol/L — ABNORMAL LOW (ref 98–111)
Chloride: 94 mmol/L — ABNORMAL LOW (ref 98–111)
Chloride: 94 mmol/L — ABNORMAL LOW (ref 98–111)
Creatinine, Ser: 1.65 mg/dL — ABNORMAL HIGH (ref 0.61–1.24)
Creatinine, Ser: 1.56 mg/dL — ABNORMAL HIGH (ref 0.61–1.24)
Creatinine, Ser: 1.33 mg/dL — ABNORMAL HIGH (ref 0.61–1.24)
Creatinine, Ser: 1.14 mg/dL (ref 0.61–1.24)
Creatinine, Ser: 1.12 mg/dL (ref 0.61–1.24)
GFR calc Af Amer: 53 mL/min — ABNORMAL LOW (ref >60)
GFR calc Af Amer: 56 mL/min — ABNORMAL LOW (ref >60)
GFR calc Af Amer: >60 mL/min (ref >60)
GFR calc Af Amer: >60 mL/min (ref >60)
GFR calc Af Amer: >60 mL/min (ref >60)
GFR calc non Af Amer: 45 mL/min — ABNORMAL LOW (ref >60)
GFR calc non Af Amer: 49 mL/min — ABNORMAL LOW (ref >60)
GFR calc non Af Amer: 59 mL/min — ABNORMAL LOW (ref >60)
GFR calc non Af Amer: >60 mL/min (ref >60)
GFR calc non Af Amer: >60 mL/min (ref >60)
Glucose, Bld: 192 mg/dL — ABNORMAL HIGH (ref 70–99)
Glucose, Bld: 209 mg/dL — ABNORMAL HIGH (ref 70–99)
Glucose, Bld: 174 mg/dL — ABNORMAL HIGH (ref 70–99)
Glucose, Bld: 180 mg/dL — ABNORMAL HIGH (ref 70–99)
Glucose, Bld: 185 mg/dL — ABNORMAL HIGH (ref 70–99)
Potassium: 2.9 mmol/L — ABNORMAL LOW (ref 3.5–5.1)
Potassium: 3.1 mmol/L — ABNORMAL LOW (ref 3.5–5.1)
Potassium: 3.2 mmol/L — ABNORMAL LOW (ref 3.5–5.1)
Potassium: 3.4 mmol/L — ABNORMAL LOW (ref 3.5–5.1)
Potassium: 3 mmol/L — ABNORMAL LOW (ref 3.5–5.1)
Sodium: 138 mmol/L (ref 135–145)
Sodium: 138 mmol/L (ref 135–145)
Sodium: 138 mmol/L (ref 135–145)
Sodium: 134 mmol/L — ABNORMAL LOW (ref 135–145)
Sodium: 133 mmol/L — ABNORMAL LOW (ref 135–145)

## 2019-03-04 LAB — GLUCOSE, CAPILLARY
Glucose-Capillary: 154 mg/dL — ABNORMAL HIGH (ref 70–99)
Glucose-Capillary: 159 mg/dL — ABNORMAL HIGH (ref 70–99)
Glucose-Capillary: 161 mg/dL — ABNORMAL HIGH (ref 70–99)
Glucose-Capillary: 170 mg/dL — ABNORMAL HIGH (ref 70–99)
Glucose-Capillary: 172 mg/dL — ABNORMAL HIGH (ref 70–99)
Glucose-Capillary: 179 mg/dL — ABNORMAL HIGH (ref 70–99)
Glucose-Capillary: 180 mg/dL — ABNORMAL HIGH (ref 70–99)
Glucose-Capillary: 180 mg/dL — ABNORMAL HIGH (ref 70–99)
Glucose-Capillary: 181 mg/dL — ABNORMAL HIGH (ref 70–99)
Glucose-Capillary: 185 mg/dL — ABNORMAL HIGH (ref 70–99)
Glucose-Capillary: 186 mg/dL — ABNORMAL HIGH (ref 70–99)
Glucose-Capillary: 188 mg/dL — ABNORMAL HIGH (ref 70–99)
Glucose-Capillary: 189 mg/dL — ABNORMAL HIGH (ref 70–99)
Glucose-Capillary: 201 mg/dL — ABNORMAL HIGH (ref 70–99)
Glucose-Capillary: 203 mg/dL — ABNORMAL HIGH (ref 70–99)
Glucose-Capillary: 205 mg/dL — ABNORMAL HIGH (ref 70–99)
Glucose-Capillary: 206 mg/dL — ABNORMAL HIGH (ref 70–99)
Glucose-Capillary: 207 mg/dL — ABNORMAL HIGH (ref 70–99)
Glucose-Capillary: 208 mg/dL — ABNORMAL HIGH (ref 70–99)
Glucose-Capillary: 209 mg/dL — ABNORMAL HIGH (ref 70–99)
Glucose-Capillary: 209 mg/dL — ABNORMAL HIGH (ref 70–99)
Glucose-Capillary: 217 mg/dL — ABNORMAL HIGH (ref 70–99)
Glucose-Capillary: 218 mg/dL — ABNORMAL HIGH (ref 70–99)

## 2019-03-04 LAB — CBC
HCT: 42.2 % (ref 39.0–52.0)
Hemoglobin: 13.7 g/dL (ref 13.0–17.0)
MCH: 28 pg (ref 26.0–34.0)
MCHC: 32.5 g/dL (ref 30.0–36.0)
MCV: 86.1 fL (ref 80.0–100.0)
Platelets: 204 10*3/uL (ref 150–400)
RBC: 4.9 MIL/uL (ref 4.22–5.81)
RDW: 14.6 % (ref 11.5–15.5)
WBC: 6.2 10*3/uL (ref 4.0–10.5)
nRBC: 0 % (ref 0.0–0.2)

## 2019-03-04 LAB — BRAIN NATRIURETIC PEPTIDE: B Natriuretic Peptide: 28.7 pg/mL (ref 0.0–100.0)

## 2019-03-04 MED ORDER — INSULIN GLARGINE 100 UNIT/ML ~~LOC~~ SOLN: 12.0000 [IU] | Freq: Every day | SUBCUTANEOUS | Status: DC

## 2019-03-04 MED ORDER — POTASSIUM CHLORIDE 10 MEQ/100ML IV SOLN
10.0000 meq | INTRAVENOUS | Status: AC
  Administered 2019-03-04: 10 meq via INTRAVENOUS
  Filled 2019-03-04: qty 100

## 2019-03-04 MED ORDER — KCL IN DEXTROSE-NACL 20-5-0.45 MEQ/L-%-% IV SOLN
INTRAVENOUS | Status: DC
  Filled 2019-03-04 (×2): qty 1000

## 2019-03-04 MED ORDER — POTASSIUM CHLORIDE 10 MEQ/100ML IV SOLN
10.0000 meq | INTRAVENOUS | Status: AC
  Administered 2019-03-04 – 2019-03-05 (×3): 10 meq via INTRAVENOUS
  Filled 2019-03-04 (×2): qty 100

## 2019-03-04 MED ORDER — LACTATED RINGERS IV BOLUS
1000.0000 mL | Freq: Once | INTRAVENOUS | Status: AC
  Administered 2019-03-04: 1000 mL via INTRAVENOUS

## 2019-03-04 MED ORDER — INSULIN GLARGINE 100 UNIT/ML ~~LOC~~ SOLN
15.0000 [IU] | Freq: Every day | SUBCUTANEOUS | Status: DC
  Administered 2019-03-04: 15 [IU] via SUBCUTANEOUS
  Filled 2019-03-04 (×2): qty 0.15

## 2019-03-04 MED ORDER — POTASSIUM CHLORIDE CRYS ER 20 MEQ PO TBCR
40.0000 meq | EXTENDED_RELEASE_TABLET | Freq: Two times a day (BID) | ORAL | Status: AC
  Administered 2019-03-04: 40 meq via ORAL
  Administered 2019-03-04: 20 meq via ORAL
  Filled 2019-03-04 (×2): qty 2

## 2019-03-04 MED ORDER — INSULIN ASPART 100 UNIT/ML ~~LOC~~ SOLN
0.0000 [IU] | Freq: Three times a day (TID) | SUBCUTANEOUS | Status: DC
  Administered 2019-03-05: 11 [IU] via SUBCUTANEOUS
  Administered 2019-03-05: 15 [IU] via SUBCUTANEOUS
  Administered 2019-03-05: 11 [IU] via SUBCUTANEOUS

## 2019-03-04 NOTE — Evaluation (Signed)
Physical Therapy Evaluation Patient Details Name: Timothy Le MRN: [id number] DOB: Jun 19, 1960 Today's Date: 03/04/2019   History of Present Illness  Timothy Le is a 59 y.o. with pertinent PMH of hypertension, thyroid goiter with tracheal compression, OSA/OHA (on CPAP), COPD, obesity, hyperlipidemia, IDDM, who presents with fatigue, weakness, polydipsia and polyuria. In ED pt found to be hyperglycemic with CBG > 600 concerning for DKA.  Clinical Impression  Prior to admission, pt lives alone and is independent with mobility and ADL's. On PT evaluation, pt presents with mild balance impairments and decreased endurance. Ambulating 200 feet with no assistive device, SpO2 93-94% on RA, HR stable in 90's. Will continue to follow acutely to progress mobility but don't anticipate need for follow up.     Follow Up Recommendations No PT follow up    Equipment Recommendations  None recommended by PT    Recommendations for Other Services       Precautions / Restrictions Precautions Precautions: Fall Restrictions Weight Bearing Restrictions: No      Mobility  Bed Mobility Overal bed mobility: Modified Independent                Transfers Overall transfer level: Needs assistance Equipment used: None Transfers: Sit to/from Stand Sit to Stand: Supervision            Ambulation/Gait Ambulation/Gait assistance: Supervision;Min guard Gait Distance (Feet): 200 Feet Assistive device: None Gait Pattern/deviations: Step-through pattern;Decreased stride length;Wide base of support Gait velocity: decreased   General Gait Details: Pt with mild dynamic instability, requiring supervision-min guard assist. DOE 2/4  Stairs            Wheelchair Mobility    Modified Rankin (Stroke Patients Only)       Balance Overall balance assessment: Mild deficits observed, not formally tested                                           Pertinent Vitals/Pain  Pain Assessment: No/denies pain    Home Living Family/patient expects to be discharged to:: Private residence Living Arrangements: Alone Available Help at Discharge: Family;Available PRN/intermittently Type of Home: Apartment Home Access: Level entry     Home Layout: One level Home Equipment: Toilet riser;Shower seat;Walker - 2 wheels      Prior Function Level of Independence: Independent               Hand Dominance   Dominant Hand: Right    Extremity/Trunk Assessment   Upper Extremity Assessment Upper Extremity Assessment: Overall WFL for tasks assessed    Lower Extremity Assessment Lower Extremity Assessment: Overall WFL for tasks assessed    Cervical / Trunk Assessment Cervical / Trunk Assessment: Other exceptions Cervical / Trunk Exceptions: increased body habitus  Communication   Communication: No difficulties  Cognition Arousal/Alertness: Awake/alert Behavior During Therapy: WFL for tasks assessed/performed Overall Cognitive Status: Within Functional Limits for tasks assessed                                        General Comments      Exercises     Assessment/Plan    PT Assessment Patient needs continued PT services  PT Problem List Decreased strength;Decreased activity tolerance;Decreased balance;Decreased mobility;Cardiopulmonary status limiting activity;Obesity       PT Treatment Interventions  Gait training;Functional mobility training;Therapeutic activities;Therapeutic exercise;Balance training;Patient/family education    PT Goals (Current goals can be found in the Care Plan section)  Acute Rehab PT Goals Patient Stated Goal: "to eat." PT Goal Formulation: With patient Time For Goal Achievement: 03/18/19 Potential to Achieve Goals: Good    Frequency Min 3X/week   Barriers to discharge        Co-evaluation               AM-PAC PT "6 Clicks" Mobility  Outcome Measure Help needed turning from your back to  your side while in a flat bed without using bedrails?: None Help needed moving from lying on your back to sitting on the side of a flat bed without using bedrails?: None Help needed moving to and from a bed to a chair (including a wheelchair)?: None Help needed standing up from a chair using your arms (e.g., wheelchair or bedside chair)?: None Help needed to walk in hospital room?: A Little Help needed climbing 3-5 steps with a railing? : A Little 6 Click Score: 22    End of Session   Activity Tolerance: Patient tolerated treatment well Patient left: in chair;with call bell/phone within reach Nurse Communication: Mobility status PT Visit Diagnosis: Unsteadiness on feet (R26.81);Difficulty in walking, not elsewhere classified (R26.2)    Time: [id number] PT Time Calculation (min) (ACUTE ONLY): 25 min   Charges:   PT Evaluation $PT Eval Moderate Complexity: 1 Mod PT Treatments $Therapeutic Activity: 8-22 mins          Wyona Almas, PT, DPT Acute Rehabilitation Services Pager [phone] Office [phone]   Deno Etienne 03/04/2019, 11:55 AM

## 2019-03-04 NOTE — Progress Notes (Addendum)
Subjective: HD#1 Events Overnight: No events overnight  Patient was seen this morning on rounds.  Patient states that he is feeling much better but still very thirsty.  He admits to improved nausea without vomiting.  Objective:  Vital signs in last 24 hours: Vitals:   03/03/19 1738 03/03/19 2016 03/04/19 0721 03/04/19 0825  BP: 130/84 139/77  140/85  Pulse: 83 73  66  Resp: 20 (!) 22    Temp: 98.7 F (37.1 C) 97.9 F (36.6 C)  98.1 F (36.7 C)  TempSrc: Oral Oral    SpO2: 97% 94% 97% 97%  Weight:      Height:        Physical Exam: Physical Exam  Constitutional: He is oriented to person, place, and time and well-developed, well-nourished, and in no distress.  HENT:  Head: Normocephalic and atraumatic.  Eyes: EOM are normal.  Cardiovascular: Normal rate, regular rhythm, normal heart sounds and intact distal pulses. Exam reveals no gallop and no friction rub.  No murmur heard. Pulmonary/Chest: Effort normal. No respiratory distress. He exhibits no tenderness.  Abdominal: Soft. He exhibits no distension. There is no abdominal tenderness.  Musculoskeletal:        General: Tenderness (Lower extremities) present. No edema. Normal range of motion.  Neurological: He is alert and oriented to person, place, and time.  Skin: Skin is warm and dry.    Filed Weights   03/03/19 1328  Weight: (!) 156.5 kg     Intake/Output Summary (Last 24 hours) at 03/04/2019 0928 Last data filed at 03/04/2019 0804 Gross per 24 hour  Intake 1351.99 ml  Output 475 ml  Net 876.99 ml    Pertinent labs/Imaging: CBC Latest Ref Rng & Units 03/04/2019 03/03/2019 03/03/2019  WBC 4.0 - 10.5 K/uL 6.2 - -  Hemoglobin 13.0 - 17.0 g/dL 13.7 15.3 16.7  Hematocrit 39.0 - 52.0 % 42.2 45.0 49.0  Platelets 150 - 400 K/uL 204 - -    CMP Latest Ref Rng & Units 03/04/2019 03/04/2019 03/03/2019  Glucose 70 - 99 mg/dL 209(H) 192(H) 373(H)  BUN 6 - 20 mg/dL 21(H) 22(H) 25(H)  Creatinine 0.61 - 1.24 mg/dL 1.56(H)  1.65(H) 1.71(H)  Sodium 135 - 145 mmol/L 138 138 132(L)  Potassium 3.5 - 5.1 mmol/L 3.1(L) 2.9(L) 4.3  Chloride 98 - 111 mmol/L 94(L) 93(L) 88(L)  CO2 22 - 32 mmol/L 27 28 19(L)  Calcium 8.9 - 10.3 mg/dL 8.9 9.3 8.8(L)  Total Protein 6.5 - 8.1 g/dL - - -  Total Bilirubin 0.3 - 1.2 mg/dL - - -  Alkaline Phos 38 - 126 U/L - - -  AST 15 - 41 U/L - - -  ALT 0 - 44 U/L - - -    DG Chest Portable 1 View  Result Date: 03/03/2019 CLINICAL DATA:  Weakness.  Fever and cough. EXAM: PORTABLE CHEST 1 VIEW COMPARISON:  June 24, 2017 FINDINGS: The heart size and mediastinal contours are within normal limits. Both lungs are clear. The visualized skeletal structures are unremarkable. IMPRESSION: No active disease. Electronically Signed   By: Dorise Bullion III M.D   On: 03/03/2019 13:55    Assessment/Plan:  Principal Problem:   Hyperosmolar hyperglycemic state (HHS) ([place]) Active Problems:   Diabetes mellitus type 2, controlled ([place])    Patient Summary: Timothy Le is a 59 y.o. with pertinent PMH of hypertension, thyroid goiter with tracheal compression, OSA/OHA (on CPAP), COPD, obesity, hyperlipidemia, IDDM, admit for HHS on hospital day 1  #HHS #  T2DM: Overnight the patient has progressed well with HHS treatment.  Although, one of his BMP is to show a low potassium of 2.9 which temporarily stopped his insulin in order for his potassium to be repleted.  Patient's glucose is below 200 at this time and anion gap at 17. - Continue IV insulin, fluids, and electrolyte replacement - Plasma osmolality of 315  Consistent with HHS - Hgb A1C pending  #Hypokalemia: -Replete as needed.  #AKI: Creatinine shows mild improvement at 1.56 today and a GFR 56.  Patient's AKI is likely secondary to prerenal azotemia in the setting of DKA.  This is supported by improvement with IV fluids.  #CHF: Patient states that he has a history of CHF and states that he was admitted prior for an episode of CHF  exacerbation.  His echoes do not show any reduced ejection fraction but previous studies were limited due to body habitus.  He states that he takes metolazone and torsemide at home but there is no note stating his previous history of CHF.  -Diuretics held during admission.  OSA/OHA: -Continue CPAP  Diet: N.p.o. IVF: Continue per DKA protocol VTE: enoxaparin Code: Full PT/OT recs: Pending TOC recs: Pending   Dispo: Anticipated discharge tomorrow.    Marianna Payment, D.O. [facility], PGY-1 Date 03/04/2019 Time 9:28 AM

## 2019-03-04 NOTE — Plan of Care (Signed)
Problem: Education: Goal: Knowledge of General Education information will improve Description: Including pain rating scale, medication(s)/side effects and non-pharmacologic comfort measures 03/04/2019 1943 by Waynetta Sandy, RN Outcome: Progressing 03/04/2019 1942 by Waynetta Sandy, RN Outcome: Progressing   Problem: Health Behavior/Discharge Planning: Goal: Ability to manage health-related needs will improve 03/04/2019 1943 by Waynetta Sandy, RN Outcome: Progressing 03/04/2019 1942 by Waynetta Sandy, RN Outcome: Progressing   Problem: Clinical Measurements: Goal: Ability to maintain clinical measurements within normal limits will improve 03/04/2019 1943 by Waynetta Sandy, RN Outcome: Progressing 03/04/2019 1942 by Waynetta Sandy, RN Outcome: Progressing Goal: Will remain free from infection 03/04/2019 1943 by Waynetta Sandy, RN Outcome: Progressing 03/04/2019 1942 by Waynetta Sandy, RN Outcome: Progressing Goal: Diagnostic test results will improve 03/04/2019 1943 by Waynetta Sandy, RN Outcome: Progressing 03/04/2019 1942 by Waynetta Sandy, RN Outcome: Progressing Goal: Respiratory complications will improve 03/04/2019 1943 by Waynetta Sandy, RN Outcome: Progressing 03/04/2019 1942 by Waynetta Sandy, RN Outcome: Progressing Goal: Cardiovascular complication will be avoided 03/04/2019 1943 by Waynetta Sandy, RN Outcome: Progressing 03/04/2019 1942 by Waynetta Sandy, RN Outcome: Progressing   Problem: Activity: Goal: Risk for activity intolerance will decrease 03/04/2019 1943 by Waynetta Sandy, RN Outcome: Progressing 03/04/2019 1942 by Waynetta Sandy, RN Outcome: Progressing   Problem: Nutrition: Goal: Adequate nutrition will be maintained 03/04/2019 1943 by Waynetta Sandy, RN Outcome: Progressing 03/04/2019 1942 by Waynetta Sandy,  RN Outcome: Progressing   Problem: Coping: Goal: Level of anxiety will decrease 03/04/2019 1943 by Waynetta Sandy, RN Outcome: Progressing 03/04/2019 1942 by Waynetta Sandy, RN Outcome: Progressing   Problem: Elimination: Goal: Will not experience complications related to bowel motility 03/04/2019 1943 by Waynetta Sandy, RN Outcome: Progressing 03/04/2019 1942 by Waynetta Sandy, RN Outcome: Progressing Goal: Will not experience complications related to urinary retention 03/04/2019 1943 by Waynetta Sandy, RN Outcome: Progressing 03/04/2019 1942 by Waynetta Sandy, RN Outcome: Progressing   Problem: Pain Managment: Goal: General experience of comfort will improve 03/04/2019 1943 by Waynetta Sandy, RN Outcome: Progressing 03/04/2019 1942 by Waynetta Sandy, RN Outcome: Progressing   Problem: Safety: Goal: Ability to remain free from injury will improve 03/04/2019 1943 by Waynetta Sandy, RN Outcome: Progressing 03/04/2019 1942 by Waynetta Sandy, RN Outcome: Progressing   Problem: Skin Integrity: Goal: Risk for impaired skin integrity will decrease 03/04/2019 1943 by Waynetta Sandy, RN Outcome: Progressing 03/04/2019 1942 by Waynetta Sandy, RN Outcome: Progressing   Problem: Education: Goal: Ability to describe self-care measures that may prevent or decrease complications (Diabetes Survival Skills Education) will improve 03/04/2019 1943 by Waynetta Sandy, RN Outcome: Progressing 03/04/2019 1942 by Waynetta Sandy, RN Outcome: Progressing Goal: Individualized Educational Video(s) 03/04/2019 1943 by Waynetta Sandy, RN Outcome: Progressing 03/04/2019 1942 by Waynetta Sandy, RN Outcome: Progressing   Problem: Cardiac: Goal: Ability to maintain an adequate cardiac output will improve 03/04/2019 1943 by Waynetta Sandy, RN Outcome: Progressing 03/04/2019  1942 by Waynetta Sandy, RN Outcome: Progressing   Problem: Health Behavior/Discharge Planning: Goal: Ability to identify and utilize available resources and services will improve 03/04/2019 1943 by Waynetta Sandy, RN Outcome: Progressing 03/04/2019 1942 by Waynetta Sandy, RN Outcome: Progressing Goal: Ability to manage health-related needs will improve 03/04/2019 1943 by Waynetta Sandy, RN Outcome: Progressing 03/04/2019 1942 by Waynetta Sandy, RN Outcome: Progressing   Problem: Fluid Volume: Goal: Ability  to achieve a balanced intake and output will improve 03/04/2019 1943 by Waynetta Sandy, RN Outcome: Progressing 03/04/2019 1942 by Waynetta Sandy, RN Outcome: Progressing   Problem: Metabolic: Goal: Ability to maintain appropriate glucose levels will improve 03/04/2019 1943 by Waynetta Sandy, RN Outcome: Progressing 03/04/2019 1942 by Waynetta Sandy, RN Outcome: Progressing   Problem: Nutritional: Goal: Maintenance of adequate nutrition will improve 03/04/2019 1943 by Waynetta Sandy, RN Outcome: Progressing 03/04/2019 1942 by Waynetta Sandy, RN Outcome: Progressing Goal: Maintenance of adequate weight for body size and type will improve 03/04/2019 1943 by Waynetta Sandy, RN Outcome: Progressing 03/04/2019 1942 by Waynetta Sandy, RN Outcome: Progressing   Problem: Respiratory: Goal: Will regain and/or maintain adequate ventilation 03/04/2019 1943 by Waynetta Sandy, RN Outcome: Progressing 03/04/2019 1942 by Waynetta Sandy, RN Outcome: Progressing   Problem: Urinary Elimination: Goal: Ability to achieve and maintain adequate renal perfusion and functioning will improve 03/04/2019 1943 by Waynetta Sandy, RN Outcome: Progressing 03/04/2019 1942 by Waynetta Sandy, RN Outcome: Progressing

## 2019-03-04 NOTE — Progress Notes (Addendum)
Results for Timothy Le, Timothy Le (MRN [id number]) as of 03/04/2019 12:42  Ref. Range 03/04/2019 07:08  Hemoglobin A1C Latest Ref Range: 4.8 - 5.6 % 16.6 (H)  (429 mg/dl)   Results for Timothy Le, Timothy Le (MRN [id number]) as of 03/04/2019 12:42  Ref. Range 03/04/2019 08:02 03/04/2019 09:07 03/04/2019 10:07 03/04/2019 11:11 03/04/2019 12:04  Glucose-Capillary Latest Ref Range: 70 - 99 mg/dL 159 (H)  IV Insulin Drip 154 (H)  IV Insulin Drip 205 (H)  IV Insulin Drip 209 (H)  IV Insulin Drip 207 (H)  IV Insulin Drip    Admit with: Hyperglycemia/ Acute Kidney Injury  History: DM, COPD  Home DM Meds: Lantus 10 units QHS  Current Orders: IV Insulin Drip    Per MD notes, plan is to leave on IV Insulin today until labs stable (Anion gap still 17 this AM)    MD- note that Current A1c of 16.6% shows very poor glucose control at home.  Patient only taking lantus at home--Should we consider sending home on Novolog SSi as well to help improve CBG control?  Pt has Medicaid and should be able to get Novolog for low cost as well.  Will try to speak w/ pt today regarding his A1c and the importance of good CBG control   Addendum 5pm--Attempted to call pt X 2 today by phone (DM Coordinator RN on call for the entire health system and not on campus presently).  Was unable to reach pt by phone.  Will attempt to call again tomorrow.    --Will follow patient during hospitalization--  Wyn Quaker RN, MSN, CDE Diabetes Coordinator Inpatient Glycemic Control Team Team Pager: [phone] (8a-5p)

## 2019-03-05 DIAGNOSIS — I5020 Unspecified systolic (congestive) heart failure: Secondary | ICD-10-CM

## 2019-03-05 LAB — BASIC METABOLIC PANEL
Anion gap: 14 (ref 5–15)
Anion gap: 15 (ref 5–15)
Anion gap: 13 (ref 5–15)
Anion gap: 10 (ref 5–15)
BUN: 10 mg/dL (ref 6–20)
BUN: 12 mg/dL (ref 6–20)
BUN: 13 mg/dL (ref 6–20)
BUN: 14 mg/dL (ref 6–20)
CO2: 24 mmol/L (ref 22–32)
CO2: 20 mmol/L — ABNORMAL LOW (ref 22–32)
CO2: 23 mmol/L (ref 22–32)
CO2: 25 mmol/L (ref 22–32)
Calcium: 8.5 mg/dL — ABNORMAL LOW (ref 8.9–10.3)
Calcium: 8.2 mg/dL — ABNORMAL LOW (ref 8.9–10.3)
Calcium: 8.3 mg/dL — ABNORMAL LOW (ref 8.9–10.3)
Calcium: 8.3 mg/dL — ABNORMAL LOW (ref 8.9–10.3)
Chloride: 94 mmol/L — ABNORMAL LOW (ref 98–111)
Chloride: 94 mmol/L — ABNORMAL LOW (ref 98–111)
Chloride: 93 mmol/L — ABNORMAL LOW (ref 98–111)
Chloride: 97 mmol/L — ABNORMAL LOW (ref 98–111)
Creatinine, Ser: 1.23 mg/dL (ref 0.61–1.24)
Creatinine, Ser: 1.2 mg/dL (ref 0.61–1.24)
Creatinine, Ser: 1.29 mg/dL — ABNORMAL HIGH (ref 0.61–1.24)
Creatinine, Ser: 1.43 mg/dL — ABNORMAL HIGH (ref 0.61–1.24)
GFR calc Af Amer: >60 mL/min (ref >60)
GFR calc Af Amer: >60 mL/min (ref >60)
GFR calc Af Amer: >60 mL/min (ref >60)
GFR calc Af Amer: >60 mL/min (ref >60)
GFR calc non Af Amer: >60 mL/min (ref >60)
GFR calc non Af Amer: >60 mL/min (ref >60)
GFR calc non Af Amer: >60 mL/min (ref >60)
GFR calc non Af Amer: 54 mL/min — ABNORMAL LOW (ref >60)
Glucose, Bld: 239 mg/dL — ABNORMAL HIGH (ref 70–99)
Glucose, Bld: 312 mg/dL — ABNORMAL HIGH (ref 70–99)
Glucose, Bld: 397 mg/dL — ABNORMAL HIGH (ref 70–99)
Glucose, Bld: 324 mg/dL — ABNORMAL HIGH (ref 70–99)
Potassium: 3.7 mmol/L (ref 3.5–5.1)
Potassium: 4.3 mmol/L (ref 3.5–5.1)
Potassium: 3.8 mmol/L (ref 3.5–5.1)
Potassium: 3.5 mmol/L (ref 3.5–5.1)
Sodium: 132 mmol/L — ABNORMAL LOW (ref 135–145)
Sodium: 129 mmol/L — ABNORMAL LOW (ref 135–145)
Sodium: 129 mmol/L — ABNORMAL LOW (ref 135–145)
Sodium: 132 mmol/L — ABNORMAL LOW (ref 135–145)

## 2019-03-05 LAB — GLUCOSE, CAPILLARY
Glucose-Capillary: 171 mg/dL — ABNORMAL HIGH (ref 70–99)
Glucose-Capillary: 188 mg/dL — ABNORMAL HIGH (ref 70–99)
Glucose-Capillary: 264 mg/dL — ABNORMAL HIGH (ref 70–99)
Glucose-Capillary: 270 mg/dL — ABNORMAL HIGH (ref 70–99)
Glucose-Capillary: 275 mg/dL — ABNORMAL HIGH (ref 70–99)
Glucose-Capillary: 317 mg/dL — ABNORMAL HIGH (ref 70–99)
Glucose-Capillary: 329 mg/dL — ABNORMAL HIGH (ref 70–99)
Glucose-Capillary: 377 mg/dL — ABNORMAL HIGH (ref 70–99)

## 2019-03-05 MED ORDER — INSULIN ASPART 100 UNIT/ML ~~LOC~~ SOLN
0.0000 [IU] | Freq: Three times a day (TID) | SUBCUTANEOUS | Status: DC
  Administered 2019-03-06: 11 [IU] via SUBCUTANEOUS

## 2019-03-05 MED ORDER — LACTATED RINGERS IV BOLUS
1000.0000 mL | Freq: Once | INTRAVENOUS | Status: AC
  Administered 2019-03-05: 1000 mL via INTRAVENOUS

## 2019-03-05 MED ORDER — LACTATED RINGERS IV SOLN: INTRAVENOUS | Status: DC

## 2019-03-05 MED ORDER — INSULIN GLARGINE 100 UNIT/ML ~~LOC~~ SOLN
30.0000 [IU] | Freq: Every day | SUBCUTANEOUS | Status: DC
  Administered 2019-03-05: 30 [IU] via SUBCUTANEOUS
  Filled 2019-03-05 (×2): qty 0.3

## 2019-03-05 MED ORDER — INSULIN ASPART 100 UNIT/ML ~~LOC~~ SOLN
6.0000 [IU] | Freq: Three times a day (TID) | SUBCUTANEOUS | Status: DC
  Administered 2019-03-05 (×2): 6 [IU] via SUBCUTANEOUS

## 2019-03-05 MED ORDER — INSULIN ASPART 100 UNIT/ML ~~LOC~~ SOLN
0.0000 [IU] | Freq: Every day | SUBCUTANEOUS | Status: DC
  Administered 2019-03-05: 4 [IU] via SUBCUTANEOUS

## 2019-03-05 MED ORDER — INSULIN GLARGINE 100 UNIT/ML ~~LOC~~ SOLN
20.0000 [IU] | Freq: Every day | SUBCUTANEOUS | Status: DC
  Filled 2019-03-05: qty 0.2

## 2019-03-05 MED ORDER — INSULIN GLARGINE 100 UNIT/ML ~~LOC~~ SOLN: 30.0000 [IU] | Freq: Every day | SUBCUTANEOUS | Status: DC

## 2019-03-05 MED ORDER — INSULIN ASPART 100 UNIT/ML ~~LOC~~ SOLN
8.0000 [IU] | Freq: Once | SUBCUTANEOUS | Status: AC
  Administered 2019-03-05: 8 [IU] via SUBCUTANEOUS

## 2019-03-05 MED ORDER — LACTATED RINGERS IV SOLN: INTRAVENOUS | Status: AC

## 2019-03-05 MED ORDER — INSULIN GLARGINE 100 UNIT/ML ~~LOC~~ SOLN
5.0000 [IU] | Freq: Once | SUBCUTANEOUS | Status: AC
  Administered 2019-03-05: 5 [IU] via SUBCUTANEOUS
  Filled 2019-03-05: qty 0.05

## 2019-03-05 NOTE — Discharge Summary (Signed)
Name: Timothy Le MRN: [id number] DOB: [birth year] 59 y.o. PCP: Kerin Perna, NP  Date of Admission: 03/03/2019  1:07 PM Date of Discharge: 03/06/19 Attending Physician: Axel Filler, *  Discharge Diagnosis: 1. Severe symptomatic hyperglycemia secondary to uncontrolled diabetes mellitus type 2  2. OSA   Discharge Medications: Allergies as of 03/06/2019   No Known Allergies     Medication List    STOP taking these medications   insulin glargine 100 UNIT/ML injection Commonly known as: LANTUS Replaced by: insulin glargine 100 unit/mL Sopn     TAKE these medications   allopurinol 100 MG tablet Commonly known as: ZYLOPRIM Take 100 mg by mouth daily.   atorvastatin 10 MG tablet Commonly known as: LIPITOR Take 10 mg by mouth daily.   BD ULTRA-FINE PEN NEEDLES 29G X 12.7MM Misc Generic drug: Insulin Pen Needle USE AS DIRECTED WITH LANTUS   budesonide-formoterol 160-4.5 MCG/ACT inhaler Commonly known as: Symbicort Inhale 2 puffs then rinse mouth, twice daiy What changed:   how much to take  how to take this  when to take this  additional instructions   desonide 0.05 % lotion Commonly known as: DESOWEN Apply 1 application topically daily. Apply to face   insulin glargine 100 unit/mL Sopn Commonly known as: LANTUS Inject 0.35 mLs (35 Units total) into the skin at bedtime. Replaces: insulin glargine 100 UNIT/ML injection   ketoconazole 2 % cream Commonly known as: NIZORAL Apply 1 application topically daily. Apply to wounds   metformin 1000 MG (OSM) 24 hr tablet Commonly known as: FORTAMET Take 1 tablet (1,000 mg total) by mouth in the morning and at bedtime.   metolazone 2.5 MG tablet Commonly known as: ZAROXOLYN Take 2.5 mg by mouth daily.   NovoLOG FlexPen 100 UNIT/ML FlexPen Generic drug: insulin aspart Inject 8 Units into the skin 3 (three) times daily with meals.   potassium chloride SA 20 MEQ tablet Commonly known as:  KLOR-CON Take 1 tablet (20 mEq total) by mouth daily.   sertraline 100 MG tablet Commonly known as: ZOLOFT Take 100 mg by mouth daily.   torsemide 5 MG tablet Commonly known as: DEMADEX Take 5 mg by mouth daily.       Disposition and follow-up:   Mr.Timothy Le was discharged from [facility] in Stable condition.  At the hospital follow up visit please address:  1.  Severe symptomatic hyperglycemia: please make sure patient is adherent with insulin regimen, that he has new battery for his glucometer.   OSA/OHS: patient needs to have better adherence to cpap   2.  Labs / imaging needed at time of follow-up: bmp, glucose   3.  Pending labs/ test needing follow-up: none   Follow-up Appointments:  Primary care provider 1 week   Hospital Course by problem list:  1. Severe Symptomatic Hyperglycemia: Patient presented with fatigue, generalized weakness, polydipsia, and polyuria. Found to have cbg>600 and serum glucose of 646, >8000 betahydroxybuterate, glucosuria and ketonuria, serum osmolality 315, istat showing respiratory alkalosis 7.532, pco2 36, bicarb 30.6 and  anion gap of 24. Patient managed for symptomatic hyperglycemia/probable HHS.   Patient was given iv bolus followed by maintenance fluids, iv insulin, appropriate potassium repletion. Once anion gap closedx2 and patient's blood glucose decrease below 250 the patient was converted to subcutaneous lantus 15units. Patient required additional insulin doses during the day.   Patient counseled on the importance of medication adherence. He was discharged with instructions to take lantus 35u qd,  novolog 8u with meals, metformin 1000mg  bid, and to check glucose levels at mealtime.   Discharge Vitals:   BP 114/68 (BP Location: Right Arm)   Pulse 88   Temp 98.6 F (37 C) (Oral)   Resp 12   Ht 5\' 8"  (1.727 m)   Wt (!) 156.5 kg   SpO2 97%   BMI 52.46 kg/m   Pertinent Labs, Studies, and Procedures:    Chest xray 03/03/19 No acute cardiopulmonary disease   A1c 16.6  Discharge Instructions: Discharge Instructions    Diet - low sodium heart healthy   Complete by: As directed    Diet - low sodium heart healthy   Complete by: As directed    Discharge instructions   Complete by: As directed    You were hospitalized for Symptomatic Hyperglycemia due to your Type 2 Diabetes. Thank you for allowing [place] to be part of your care.   Please follow up with your providers within 1 week: 1) Primary Care Provider - take about diabetes medications and  Diuretic therapy   Please note these changes made to your medications:   Please START taking:  1. I changed your lantus to 30 units at night and novolog 8 units with meals (please check you blood sugar at least 3 times a day). 2. Otherwise, continue your outpatient medications.  Please make sure to follow up with your primary care physician to adjust your medications and check your blood glucose at least 3 times daily.  Please call our clinic if you have any questions or concerns, we may be able to help and keep you from a long and expensive emergency room wait. Our clinic and after hours phone number is [phone], the best time to call is Monday through Friday 9 am to 4 pm but there is always someone available 24/7 if you have an emergency. If you need medication refills please notify your pharmacy one week in advance and they will send [place] a request.   Discharge instructions   Complete by: As directed    You were hospitalized for Symptomatic Hyperglycemia. Thank you for allowing [place] to be part of your care.   We arranged for you to follow up at:   - Please follow up with your primary care doctor within 1 week.   Please note these changes made to your medications:   Please START taking:  1. Latus 35 units at bed time 2. Novolog 8 units 3 times a day with meals 3. Metformin 500 mg Twice daily. 4. Please check your blood sugar 3 time  daily.  See you after visit summary for more information about your medications.   Please make sure to follow up with your medical doctors. Please check you blood sugar 3 time daily.  Please call our clinic if you have any questions or concerns, we may be able to help and keep you from a long and expensive emergency room wait. Our clinic and after hours phone number is [phone], the best time to call is Monday through Friday 9 am to 4 pm but there is always someone available 24/7 if you have an emergency. If you need medication refills please notify your pharmacy one week in advance and they will send [place] a request.   Increase activity slowly   Complete by: As directed    Increase activity slowly   Complete by: As directed       Signed: Lars Mage, MD 03/06/2019, 11:13 AM

## 2019-03-05 NOTE — Progress Notes (Addendum)
   Subjective:   Worked well with physical therapy. Able to ambulate 200 feet without assistive device. Patient asking for breakfast. Stated that his feet were cramping.  Objective:  Vital signs in last 24 hours: Vitals:   03/04/19 1600 03/04/19 1932 03/04/19 2348 03/05/19 0000  BP: 129/60 132/71    Pulse: 79 76 74   Resp:  14 (!) 26 (!) 21  Temp: 98.2 F (36.8 C) 97.8 F (36.6 C)    TempSrc: Axillary Oral    SpO2: 97% 99% 97%   Weight:      Height:       Physical Exam  Constitutional: He is oriented to person, place, and time. He appears well-developed and well-nourished. No distress.  HENT:  Head: Normocephalic and atraumatic.  Cardiovascular: Normal rate, regular rhythm and normal heart sounds.  Respiratory:  With cpap mask on   GI: Soft. Bowel sounds are normal. He exhibits distension. There is no abdominal tenderness.  Musculoskeletal:        General: No edema.  Neurological: He is alert and oriented to person, place, and time.  Skin: Skin is dry. He is not diaphoretic.  Psychiatric: He has a normal mood and affect. His behavior is normal. Judgment and thought content normal.   Assessment/Plan:  Principal Problem:   Hyperosmolar hyperglycemic state (HHS) ([place]) Active Problems:   OSA (obstructive sleep apnea)   Severe obesity (BMI >= 40) (HCC)   Diabetes mellitus type 2, controlled ([place])   AKI (acute kidney injury) [facility])  Mr. Udy is a 59 y.o with htn, osa/ohs, hld, uncontrolled iddm, and thyroid goiter with tracheal compression who is being treated for symptomatic hyperglycemia  Symptomatic Hyperglycemia  Glucose in 700s on admission, A1c 16%. Anion gap has closed x2 overnight. Patient was started on lantus 15units qd but unfortunately d5 drip was still continued. Patient's morning  glucose continues to be ranging 170s-240s. Will add another 5u of lantus. Stop d5 drip.   -stop d5 drip -Continue lantus 20u qd  -continue ssi  -follow up with pcp for  diabetes follow up -appreciate diabetes educator helping in counsel patient  -switched from progressive to med tele floor status  AKI Resolved  HFrEF Patient's last tte was in May 2019 that shows ef 55-60%. Poor quality echo. Will need follow up outpatient. Appears euvolemic on exam. Held diuretics in setting of severe hyperglycemia and need for intravascular repletion.   -Resume torsemide and metolazone outpatient with further workup to ensure patient needs diuretics  OSA/OHS  -continue cpap hs   Dispo: Anticipated discharge in approximately 1-2 day(s).   Lars Mage, MD 03/05/2019, 6:27 AM

## 2019-03-05 NOTE — Progress Notes (Addendum)
Patient's 13:49 bmp showed blood glucose of 397. Patient's high glucose levels are likely due to patient's insulin being underdosed and the patient being dehydrated.   -1L Lactated Ringer bolus followed by infusion of LR run at 187ml/hr for 15hrs -Lantus 30u at 22:00 -continue ssi -repeat bmp at 18:30 -Patient is agreeable with staying overnight to achieve tight glucose control.   Lars Mage, MD Internal Medicine PGY3 03/05/2019, 2:49 PM

## 2019-03-06 DIAGNOSIS — Z6841 Body Mass Index (BMI) 40.0 and over, adult: Secondary | ICD-10-CM

## 2019-03-06 DIAGNOSIS — E1165 Type 2 diabetes mellitus with hyperglycemia: Secondary | ICD-10-CM

## 2019-03-06 LAB — BASIC METABOLIC PANEL
Anion gap: 12 (ref 5–15)
BUN: 14 mg/dL (ref 6–20)
CO2: 25 mmol/L (ref 22–32)
Calcium: 8.1 mg/dL — ABNORMAL LOW (ref 8.9–10.3)
Chloride: 97 mmol/L — ABNORMAL LOW (ref 98–111)
Creatinine, Ser: 1.35 mg/dL — ABNORMAL HIGH (ref 0.61–1.24)
GFR calc Af Amer: >60 mL/min (ref >60)
GFR calc non Af Amer: 58 mL/min — ABNORMAL LOW (ref >60)
Glucose, Bld: 287 mg/dL — ABNORMAL HIGH (ref 70–99)
Potassium: 3.3 mmol/L — ABNORMAL LOW (ref 3.5–5.1)
Sodium: 134 mmol/L — ABNORMAL LOW (ref 135–145)

## 2019-03-06 LAB — GLUCOSE, CAPILLARY
Glucose-Capillary: 278 mg/dL — ABNORMAL HIGH (ref 70–99)
Glucose-Capillary: 357 mg/dL — ABNORMAL HIGH (ref 70–99)
Glucose-Capillary: 329 mg/dL — ABNORMAL HIGH (ref 70–99)

## 2019-03-06 LAB — HEMOGLOBIN A1C
Hgb A1c MFr Bld: >15.5 % — ABNORMAL HIGH (ref 4.8–5.6)
Mean Plasma Glucose: >398 mg/dL

## 2019-03-06 MED ORDER — NOVOLOG FLEXPEN 100 UNIT/ML ~~LOC~~ SOPN: 8.0000 [IU] | PEN_INJECTOR | Freq: Three times a day (TID) | SUBCUTANEOUS | 11 refills | Status: DC

## 2019-03-06 MED ORDER — INSULIN ASPART 100 UNIT/ML ~~LOC~~ SOLN
10.0000 [IU] | Freq: Three times a day (TID) | SUBCUTANEOUS | Status: DC
  Administered 2019-03-06: 10 [IU] via SUBCUTANEOUS

## 2019-03-06 MED ORDER — INSULIN GLARGINE 100 UNIT/ML ~~LOC~~ SOLN
35.0000 [IU] | Freq: Every day | SUBCUTANEOUS | Status: DC
  Filled 2019-03-06: qty 0.35

## 2019-03-06 MED ORDER — METFORMIN HCL 1000 MG PO TABS: 1000.0000 mg | ORAL_TABLET | Freq: Two times a day (BID) | ORAL | 0 refills | Status: DC

## 2019-03-06 MED ORDER — INSULIN GLARGINE 100 UNITS/ML SOLOSTAR PEN: 35.0000 [IU] | PEN_INJECTOR | Freq: Every day | SUBCUTANEOUS | 0 refills | Status: DC

## 2019-03-06 MED ORDER — METFORMIN HCL ER 500 MG PO TB24: 500.0000 mg | ORAL_TABLET | Freq: Two times a day (BID) | ORAL | 0 refills | Status: DC

## 2019-03-06 MED ORDER — POTASSIUM CHLORIDE 10 MEQ/100ML IV SOLN
10.0000 meq | INTRAVENOUS | Status: AC
  Administered 2019-03-06 (×2): 10 meq via INTRAVENOUS
  Filled 2019-03-06 (×2): qty 100

## 2019-03-06 MED ORDER — METFORMIN HCL ER (OSM) 1000 MG PO TB24: 500.0000 mg | ORAL_TABLET | Freq: Two times a day (BID) | ORAL | 0 refills | Status: DC

## 2019-03-06 MED FILL — LANTUS SOLOSTAR 100 UNITS/M: 100 | 30 days supply | Qty: 12 | Fill #0

## 2019-03-06 MED FILL — NOVOLOG FLEXPEN SYRINGE: 100 | 30 days supply | Qty: 9 | Fill #0

## 2019-03-06 MED FILL — metFORMIN HCL 1000 MG TABS: 1000 | 30 days supply | Qty: 60 | Fill #0

## 2019-03-06 MED FILL — PENTIPS 31G X 8 MM MISC: 31G X 8 MM | 30 days supply | Qty: 100 | Fill #0

## 2019-03-06 NOTE — Progress Notes (Addendum)
Inpatient Diabetes Program Recommendations  AACE/ADA: New Consensus Statement on Inpatient Glycemic Control (2015)  Target Ranges:  Prepandial:   less than 140 mg/dL      Peak postprandial:   less than 180 mg/dL (1-2 hours)      Critically ill patients:  140 - 180 mg/dL   Results for DAVIONNE, DOWTY (MRN [id number]) as of 03/06/2019 07:42  Ref. Range 03/05/2019 07:18 03/05/2019 09:30 03/05/2019 11:25 03/05/2019 15:47 03/05/2019 18:13 03/05/2019 19:49  Glucose-Capillary Latest Ref Range: 70 - 99 mg/dL 270 (H) 264 (H)  11 units NOVOLOG +  5 units LANTUS  329 (H)  21 units NOVOLOG  377 (H)  8 units NOVOLOG  275 (H)  17 units NOVOLOG  317 (H)  4 units NOVOLOG +  30 units LANTUS   Results for ELEANOR, DIMICHELE (MRN [id number]) as of 03/06/2019 07:42  Ref. Range 03/06/2019 06:34  Glucose-Capillary Latest Ref Range: 70 - 99 mg/dL 278 (H)   Results for BEAUREGARD, JARRELLS (MRN [id number]) as of 03/06/2019 07:42  Ref. Range 06/09/2017 21:12 03/04/2019 07:08  Hemoglobin A1C Latest Ref Range: 4.8 - 5.6 % 5.4 16.6 (H)  (429 mg/dl)    Admit with: Hyperglycemia/ Acute Kidney Injury  History: DM, COPD  Home DM Meds: Lantus 10 units QHS  Current Orders: Lantus 35 units QHS      Novolog Resistant Correction Scale/ SSI (0-20 units) TID AC + HS      Novolog 10 units TID with meals    MD- Please consider the following in-hospital insulin adjustments:  1. CBG 278 this AM--May want to consider increasing Lantus further to 40 units QHS   2. Note that Current A1c of 16.6% shows very poor glucose control at home.  Patient only taking lantus at home--Should we consider sending home on Novolog SSi as well to help improve CBG control?  Pt has Medicaid and should be able to get Novolog for low cost as well.     Told ED MD that the battery in his CBG meter is dead but that he has still been taking his insulin.      Addendum 11:30am--Met w/ pt this AM to discuss intensification of Insulin  regimen for home.  Pt told me he has been very sedentary since the beginning of the pandemic and has gained a lot of weight back.  Only currently taking Lantus 10 units QHS at home.  Sees Leafy Kindle, PA in [place] for medical needs.  Has Medicaid and gets meds for $3.  Stated CBG meter battery is dead but plans to go to the store to get a new battery.  Reviewed current A1c of 16.6% with pt--Explained what an A1c is and what it measures.  Reminded patient that his goal A1c is 7% or less per ADA standards to prevent both acute and long-term complications.  Explained to patient the extreme importance of good glucose control at home.  Encouraged patient to check his CBGs at least TID AC meals at home and to record all CBGs in a logbook for his PCP to review.  Reviewed discharge insulins with pt (Lantus and Novolog).  Explained what each insulin is, how they work, when to take, etc.  Reminded pt to be consistent with the timing of his Lantus and Novolog and to never guess at how much he should take.  Encouraged pt to call his PCP if he has issues with high or low CBGs for further guidance on his insulin.  Also reviewed Hypoglycemia  signs and symptoms with patient and how to treat HYPO at home.     --Will follow patient during hospitalization--  Wyn Quaker RN, MSN, CDE Diabetes Coordinator Inpatient Glycemic Control Team Team Pager: [phone] (8a-5p)

## 2019-03-06 NOTE — TOC Transition Note (Signed)
Transition of Care [facility]) - CM/SW Discharge Note   Patient Details  Name: ITALO LAFARY MRN: [id number] Date of Birth: 05/13/1960  Transition of Care [facility] CM/SW Contact:  Pollie Friar, RN Phone Number: 03/06/2019, 11:41 AM   Clinical Narrative:    Pt discharging home with self care. No f/u per PT and no DME needs.  Pt has hospital f/u and medications to be delivered to the room via [facility].  Pt without transportation home. CM provided cab voucher to bedside RN.    Final next level of care: Home/Self Care Barriers to Discharge: Transportation, Barriers Resolved   Patient Goals and CMS Choice        Discharge Placement                       Discharge Plan and Services                                     Social Determinants of Health (SDOH) Interventions     Readmission Risk Interventions No flowsheet data found.

## 2019-03-06 NOTE — Progress Notes (Signed)
Physical Therapy Treatment and Discharge Patient Details Name: Timothy Le MRN: [id number] DOB: 07-Dec-1960 Today's Date: 03/06/2019    History of Present Illness Timothy Le is a 59 y.o. with pertinent PMH of hypertension, thyroid goiter with tracheal compression, OSA/OHA (on CPAP), COPD, obesity, hyperlipidemia, IDDM, who presents with fatigue, weakness, polydipsia and polyuria. In ED pt found to be hyperglycemic with CBG > 600 concerning for DKA.    PT Comments    Patient reports he is back to his baseline for mobility. Reports he was very off-balance when admitted with very elevated glucose levels. Completed all items of DGI except steps without significant impairment in gait or balance. Patient is discharged from PT with instructions to continue ambulation in his room (nursing had already given permisssion) and out in hallway with mask. All goals met. Patient in agreement.     Follow Up Recommendations  No PT follow up     Equipment Recommendations  None recommended by PT    Recommendations for Other Services       Precautions / Restrictions Precautions Precautions: Fall Restrictions Weight Bearing Restrictions: No    Mobility  Bed Mobility Overal bed mobility: Modified Independent                Transfers Overall transfer level: Modified independent Equipment used: None Transfers: Sit to/from Stand Sit to Stand: Modified independent (Device/Increase time)            Ambulation/Gait Ambulation/Gait assistance: Supervision;Modified independent (Device/Increase time) Gait Distance (Feet): 90 Feet(100; 50; 140 (standing rests between each)) Assistive device: None Gait Pattern/deviations: Step-through pattern;Wide base of support Gait velocity: WFL initially, decreased with fatigue Gait velocity interpretation: 1.31 - 2.62 ft/sec, indicative of limited community ambulator General Gait Details: Supervision initially due to incr lateral sway, however  with continued ambulation, feel this is normal for this patient due to body habitus; progressed to modified independent with no imbalance   Stairs             Wheelchair Mobility    Modified Rankin (Stroke Patients Only)       Balance Overall balance assessment: Modified Independent(see portions DGI completed)                               Standardized Balance Assessment Standardized Balance Assessment : Dynamic Gait Index   Dynamic Gait Index Level Surface: Normal Change in Gait Speed: Mild Impairment Gait with Horizontal Head Turns: Normal Gait with Vertical Head Turns: Normal Gait and Pivot Turn: Normal Step Over Obstacle: Moderate Impairment Step Around Obstacles: Normal      Cognition Arousal/Alertness: Awake/alert Behavior During Therapy: WFL for tasks assessed/performed Overall Cognitive Status: Within Functional Limits for tasks assessed                                        Exercises      General Comments General comments (skin integrity, edema, etc.): +dyspnea on exertion, however HR steady at 105-109 bpm; able to talk entire time walking      Pertinent Vitals/Pain Pain Assessment: No/denies pain    Home Living                      Prior Function            PT Goals (current goals can now be found  in the care plan section) Acute Rehab PT Goals Patient Stated Goal: "to eat." PT Goal Formulation: With patient Time For Goal Achievement: 03/18/19 Potential to Achieve Goals: Good Progress towards PT goals: Goals met/education completed, patient discharged from PT    Frequency    (d/c this visit)      PT Plan Current plan remains appropriate    Co-evaluation              AM-PAC PT "6 Clicks" Mobility   Outcome Measure  Help needed turning from your back to your side while in a flat bed without using bedrails?: None Help needed moving from lying on your back to sitting on the side of a  flat bed without using bedrails?: None Help needed moving to and from a bed to a chair (including a wheelchair)?: None Help needed standing up from a chair using your arms (e.g., wheelchair or bedside chair)?: None Help needed to walk in hospital room?: None Help needed climbing 3-5 steps with a railing? : A Little 6 Click Score: 23    End of Session   Activity Tolerance: Patient tolerated treatment well Patient left: with call bell/phone within reach;in bed(bed alarm off on arrival; demonstrated safe in mobility)   PT Visit Diagnosis: Unsteadiness on feet (R26.81);Difficulty in walking, not elsewhere classified (R26.2)   PT Discharge Note  Patient is being discharged from PT services secondary to:  Marland Kitchen Goals met and no further therapy needs identified.  Please see latest Therapy Progress Note for current level of functioning and progress toward goals.  Progress and discharge plan and discussed with patient/caregiver and they  . Agree      Time: 6997-8020 PT Time Calculation (min) (ACUTE ONLY): 12 min  Charges:  $Gait Training: 8-22 mins                      Arby Barrette, PT Pager [phone]    Rexanne Mano 03/06/2019, 9:30 AM

## 2019-03-06 NOTE — Progress Notes (Signed)
Subjective: HD#3 Events Overnight: Patient had low potassium overnight of 3.3 and was repleted by 19.  Patient was seen this morning on rounds. Patient was seen this morning on rounds. He states that he is feeling much better. I counseled him on the importance of taking his insulin as prescribed daily and checking his blood glucoses 3 times daily to decrease his risk of CV disease and stroke. Patient admits to understanding.  Objective:  Vital signs in last 24 hours: Vitals:   03/05/19 1220 03/05/19 1930 03/05/19 1942 03/06/19 0300  BP: 133/88  124/68 131/86  Pulse: 68  82 65  Resp:      Temp: 98.2 F (36.8 C)  98.6 F (37 C) 98.2 F (36.8 C)  TempSrc: Oral  Oral Oral  SpO2: 98% 96% 97% 99%  Weight:      Height:        Physical Exam: Physical Exam  Constitutional: He is oriented to person, place, and time and well-developed, well-nourished, and in no distress.  HENT:  Head: Normocephalic and atraumatic.  Eyes: EOM are normal.  Cardiovascular: Normal rate, regular rhythm, normal heart sounds and intact distal pulses. Exam reveals no gallop and no friction rub.  No murmur heard. Pulmonary/Chest: Effort normal. No respiratory distress. He exhibits no tenderness.  Abdominal: Soft. He exhibits no distension. There is no abdominal tenderness.  Musculoskeletal:        General: Tenderness (LE) present. No edema. Normal range of motion.     Cervical back: Normal range of motion.  Neurological: He is alert and oriented to person, place, and time.  Skin: Skin is warm and dry.    Filed Weights   03/03/19 1328  Weight: (!) 156.5 kg     Intake/Output Summary (Last 24 hours) at 03/06/2019 0532 Last data filed at 03/06/2019 0450 Gross per 24 hour  Intake 1467.82 ml  Output 1950 ml  Net -482.18 ml    Pertinent labs/Imaging: CBC Latest Ref Rng & Units 03/04/2019 03/03/2019 03/03/2019  WBC 4.0 - 10.5 K/uL 6.2 - -  Hemoglobin 13.0 - 17.0 g/dL 13.7 15.3 16.7  Hematocrit 39.0 -  52.0 % 42.2 45.0 49.0  Platelets 150 - 400 K/uL 204 - -    CMP Latest Ref Rng & Units 03/06/2019 03/05/2019 03/05/2019  Glucose 70 - 99 mg/dL 287(H) 324(H) 397(H)  BUN 6 - 20 mg/dL 14 14 13   Creatinine 0.61 - 1.24 mg/dL 1.35(H) 1.43(H) 1.29(H)  Sodium 135 - 145 mmol/L 134(L) 132(L) 129(L)  Potassium 3.5 - 5.1 mmol/L 3.3(L) 3.5 3.8  Chloride 98 - 111 mmol/L 97(L) 97(L) 93(L)  CO2 22 - 32 mmol/L 25 25 23   Calcium 8.9 - 10.3 mg/dL 8.1(L) 8.3(L) 8.3(L)  Total Protein 6.5 - 8.1 g/dL - - -  Total Bilirubin 0.3 - 1.2 mg/dL - - -  Alkaline Phos 38 - 126 U/L - - -  AST 15 - 41 U/L - - -  ALT 0 - 44 U/L - - -    No results found.  Assessment/Plan:  Principal Problem:   Hyperosmolar hyperglycemic state (HHS) ([place]) Active Problems:   OSA (obstructive sleep apnea)   Severe obesity (BMI >= 40) (HCC)   Diabetes mellitus type 2, controlled ([place])   AKI (acute kidney injury) [facility])    Patient Summary: Timothy Le is a 59 y.o. with pertinent PMH of HTN, DM, HLD, admit for Symptomatic Hyperglycemia on hospital day 3  #Symptomatic Hyperglycemia  #Diabetes Mellitus - Type 2 Anion GAP  has been closed at least 2x and remains closed. Patient is feeling much better. He is tolerating 30 units of Lantus daily with SSI. - Continue Lantus 30 mg - Continue SSI. - I will discharge him on 35 units of lantus, 8 units of novolog TID with meals and metformin. - He will need close follow up with his out patient provider.  OSA/OHA: - Continue CPAP qHS  Diet: Carb modified  IVF: LR 125 ml/hr VTE: enoxaprin Code: full PT/OT recs: no PT f/u TOC recs: n/a  Dispo: Anticipated discharge today.    Marianna Payment, D.O. [facility], PGY-1 Date 03/06/2019 Time 5:32 AM

## 2019-03-07 ENCOUNTER — Telehealth: Payer: Self-pay

## 2019-03-07 NOTE — Telephone Encounter (Signed)
Transition Care Management Follow-up Telephone Call  Date of discharge and from where: 03/06/2019, [facility]   How have you been since you were released from the hospital? He said he is doing a lot better  Any questions or concerns? None at this time  Items Reviewed:  Did the pt receive and understand the discharge instructions provided? Yes he has the instructions and did not have any questions.   Medications obtained and verified? He said he has all medications and did not have any questions. He noted that the medication instructions were reviewed prior to discharge and he did not need to review the list at this time.  He correctly stated the orders for his insulins.   Any new allergies since your discharge? None reported   Do you have support at home? Lives alone  Other (ie: DME, Home Health, etc) no home health ordered,   Has a glucometer but needs a new battery then stated that the glucometer is old and he needs a new one.   Has CPAP but does not like his mask. Michela Pitcher he will need to speak to Dr Annamaria Boots.   Functional Questionnaire: (I = Independent and D = Dependent) ADL's: independent   Follow up appointments reviewed:    PCP Hospital f/u appt confirmed? Appointment scheduled with Ms Oletta Lamas, NP  - 03/13/2019 @ 1050. He would like in person appt   [facility] f/u appt confirmed? Pulmonary - 05/24/2019  Are transportation arrangements needed? No, he uses [facility]  If their condition worsens, is the pt aware to call  their PCP or go to the ED? yes  Was the patient provided with contact information for the PCP's office or ED?he has the phone number to contact PCP  Was the pt encouraged to call back with questions or concerns?yes, he said he would call PCP

## 2019-03-13 ENCOUNTER — Ambulatory Visit (INDEPENDENT_AMBULATORY_CARE_PROVIDER_SITE_OTHER): Payer: Medicaid Other | Admitting: Primary Care

## 2019-04-19 ENCOUNTER — Telehealth: Payer: Self-pay | Admitting: Internal Medicine

## 2019-04-19 NOTE — Telephone Encounter (Signed)
Timothy Le, please advise on CMN, thanks

## 2019-04-19 NOTE — Telephone Encounter (Signed)
If this CMN was for Dr. Annamaria Boots I don't have anything for this patient. Where did they fax it?

## 2019-04-20 NOTE — Telephone Encounter (Signed)
Attempted to call Timothy Le with [employer] to get her to refax the CMN but unable to reach. Left a detailed message to have her refax the form and also stated to her to return call so we can know that she received my message and to make sure she does refax the form. Will keep encounter open while we make sure we receive the form.

## 2019-04-20 NOTE — Telephone Encounter (Signed)
[facility], received a message that their office is currently closed. Will route to triage for follow up later this morning.

## 2019-04-24 NOTE — Telephone Encounter (Signed)
I still have not gotten a CMN for this patient you need to verify where they are faxing the CMN to

## 2019-04-24 NOTE — Telephone Encounter (Signed)
Called and spoke to Lawtey with [employer] and was advised they have re-faxed this CMN to our front main fax on 4/1. This needs to be re-faxed to [phone].   Rodena Piety, please advise if you have received this. Thanks.

## 2019-04-25 ENCOUNTER — Other Ambulatory Visit: Payer: Self-pay

## 2019-04-25 ENCOUNTER — Encounter: Payer: Medicaid Other | Attending: Internal Medicine | Admitting: Registered"

## 2019-04-25 ENCOUNTER — Encounter: Payer: Self-pay | Admitting: Registered"

## 2019-04-25 DIAGNOSIS — E119 Type 2 diabetes mellitus without complications: Secondary | ICD-10-CM | POA: Insufficient documentation

## 2019-04-25 NOTE — Telephone Encounter (Signed)
While on the phone yesterday 4/5 she verified she was sending CMN to front main fax and verified the fax number (see message below). Rodena Piety, if it cannot be located today could you call her to fax the CMN directly to your fax please? Thanks.

## 2019-04-25 NOTE — Patient Instructions (Addendum)
Goals:  Follow Diabetes Meal Plan as instructed  Eat 3 meals and 2 snacks, every 3-5 hrs  Aim to have 1/2 plate of non-starchy vegetables, 1/4 plate of starch/grain, and 1/4 plate protein for lunch and dinner   Snacks to include source of carbohydrates + protein such as fruit + nuts or fruit + cheese or peanut butter crackers or cheese and crackers or graham cracker + peanut butter  Add lean protein foods to meals/snacks  Monitor glucose levels as instructed by your doctor  Aim for 30 mins of physical activity daily  Bring food record and glucose log to your next nutrition visit

## 2019-04-25 NOTE — Progress Notes (Signed)
Diabetes Self-Management Education  Visit Type:  First/Initial  Appt. Start Time: 10:10 Appt. End Time: 11:35  04/25/2019  [name], identified by name and date of birth, is a 59 y.o. male with a diagnosis of Diabetes: Type 2.   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 States he had CHF and was in hospital for 9.5 months about a year ago.   Reports diabetes runs in his family. States Mom cooked a lot of food while growing up and he likes to eat southern-style cooked food. States he is trying to reduce intake of desserts because he is tired of getting stuck and going to the hospital. Reports he was doing well when getting out of the hospital in Feb 2021 but started smelling fried chicken again and has been eating it. States he can cook but likes to order food and have it delivered. Reports weight gain during the pandemic. States in the past he was told how much to eat and that he couldn't have cookies and noticed he began sneaking food. States he doesn't like to eat dinner after 7 pm, goes to bed at 11 pm and wants a snack between times.    Reports he has reduced sodium intake. States he is checking BS 2x/day: FBS (234) and before lunch (200-300). States he was informed to contact doctor if BS is higher than 400. States he has blood pressure machine at home but does not know how to use equipment. States he wants to get his FBS back down to 107-114.  States he wants to take better care of his health because he wants to see his son graduate from [place].  There were no vitals taken for this visit. There is no height or weight on file to calculate BMI.   Diabetes Self-Management Education - 04/25/19 1021      Health Coping   How would you rate your overall health?  Fair      Psychosocial Assessment   Patient Belief/Attitude about Diabetes   Motivated to manage diabetes    Self-care barriers  Low literacy    Self-management support  Doctor's office    Patient Concerns  Nutrition/Meal planning;Healthy Lifestyle;Weight Control    Special Needs  None    Preferred Learning Style  Auditory;Visual;Hands on    Learning Readiness  Ready      Complications   How often do you check your blood sugar?  1-2 times/day    Fasting Blood glucose range (mg/dL)  >200    Postprandial Blood glucose range (mg/dL)  >200    Number of hypoglycemic episodes per month  0    Number of hyperglycemic episodes per week  0    Have you had a dilated eye exam in the past 12 months?  No    Have you had a dental exam in the past 12 months?  No    Are you checking your feet?  Yes    How many days per week are you checking your feet?  1      Dietary Intake   Breakfast  skipped    Lunch  2 chicken legs + mac and cheese    Dinner  cubed steak + tator tots + 2 bottles of water    Beverage(s)  water (2*16 oz), iced tea, beer (2 cans)  Exercise   Exercise Type  Light (walking / raking leaves)    How many days per week to you exercise?  7    How many minutes per day do you exercise?  20    Total minutes per week of exercise  140      Patient Education   Previous Diabetes Education  No    Disease state   Definition of diabetes, type 1 and 2, and the diagnosis of diabetes;Factors that contribute to the development of diabetes    Nutrition management   Role of diet in the treatment of diabetes and the relationship between the three main macronutrients and blood glucose level;Food label reading, portion sizes and measuring food.;Reviewed blood glucose goals for pre and post meals and how to evaluate the patients' food intake on their blood glucose level.    Physical activity and exercise   Role of exercise on diabetes management, blood pressure control and cardiac health.    Monitoring  Purpose and frequency of SMBG.;Daily foot exams;Yearly dilated eye  exam;Identified appropriate SMBG and/or A1C goals.;Interpreting lab values - A1C, lipid, urine microalbumina.;Taught/discussed recording of test results and interpretation of SMBG.    Acute complications  Taught treatment of hypoglycemia - the 15 rule.;Discussed and identified patients' treatment of hyperglycemia.    Chronic complications  Relationship between chronic complications and blood glucose control;Reviewed with patient heart disease, higher risk of, and prevention;Applicable immunizations;Retinopathy and reason for yearly dilated eye exams;Dental care;Lipid levels, blood glucose control and heart disease    Psychosocial adjustment  Role of stress on diabetes      Individualized Goals (developed by patient)   Nutrition  Follow meal plan discussed;General guidelines for healthy choices and portions discussed    Physical Activity  Exercise 3-5 times per week;30 minutes per day    Medications  take my medication as prescribed    Monitoring   test my blood glucose as discussed    Reducing Risk  increase portions of nuts and seeds;treat hypoglycemia with 15 grams of carbs if blood glucose less than 6m/dL      Post-Education Assessment   Patient understands the diabetes disease and treatment process.  Demonstrates understanding / competency    Patient understands incorporating nutritional management into lifestyle.  Demonstrates understanding / competency    Patient undertands incorporating physical activity into lifestyle.  Demonstrates understanding / competency    Patient understands using medications safely.  Demonstrates understanding / competency    Patient understands monitoring blood glucose, interpreting and using results  Demonstrates understanding / competency    Patient understands prevention, detection, and treatment of acute complications.  Demonstrates understanding / competency    Patient understands prevention, detection, and treatment of chronic complications.  Demonstrates  understanding / competency    Patient understands how to develop strategies to address psychosocial issues.  Demonstrates understanding / competency    Patient understands how to develop strategies to promote health/change behavior.  Demonstrates understanding / competency      Outcomes   Program Status  Not Completed       Learning Objective:  Patient will have a greater understanding of diabetes self-management. Patient education plan is to attend individual and/or group sessions per assessed needs and concerns.   Plan:   Patient Instructions  Goals:  Follow Diabetes Meal Plan as instructed  Eat 3 meals and 2 snacks, every 3-5 hrs  Aim to have 1/2 plate of non-starchy vegetables, 1/4 plate of starch/grain, and 1/4 plate protein for lunch and  dinner   Snacks to include source of carbohydrates + protein such as fruit + nuts or fruit + cheese or peanut butter crackers or cheese and crackers or graham cracker + peanut butter  Add lean protein foods to meals/snacks  Monitor glucose levels as instructed by your doctor  Aim for 30 mins of physical activity daily  Bring food record and glucose log to your next nutrition visit     Expected Outcomes:  Demonstrated interest in learning. Expect positive outcomes  Education material provided: ADA - How to Thrive: A Guide for Your Journey with Diabetes  If problems or questions, patient to contact team via:  Phone and Email  Future DSME appointment: - 4-6 wks

## 2019-04-25 NOTE — Progress Notes (Signed)
  Diabetes Self-Management Education  Visit Type:  First/Initial  Appt. Start Time: 10:10 Appt. End Time: 11:35  04/25/2019  [name], identified by name and date of birth, is a 59 y.o. male with a diagnosis of Diabetes: Type 2.   ASSESSMENT    There were no vitals taken for this visit. There is no height or weight on file to calculate BMI.   Diabetes Self-Management Education - 04/25/19 1021      Health Coping   How would you rate your overall health?  Fair      Psychosocial Assessment   Patient Belief/Attitude about Diabetes  Motivated to manage diabetes    Self-care barriers  Low literacy    Self-management support  Doctor's office    Patient Concerns  Nutrition/Meal planning;Healthy Lifestyle;Weight Control    Special Needs  None    Preferred Learning Style  Auditory;Visual;Hands on    Learning Readiness  Ready      Complications   How often do you check your blood sugar?  1-2 times/day    Fasting Blood glucose range (mg/dL)  >200    Postprandial Blood glucose range (mg/dL)  >200    Number of hypoglycemic episodes per month  0    Number of hyperglycemic episodes per week  0    Have you had a dilated eye exam in the past 12 months?  No    Have you had a dental exam in the past 12 months?  No    Are you checking your feet?  Yes    How many days per week are you checking your feet?  1      Dietary Intake   Breakfast  skipped    Lunch  2 chicken legs + mac and cheese    Dinner  cubed steak + tator tots + 2 bottles of water    Beverage(s)  water (2*16 oz), iced tea, beer (2 cans)      Exercise   Exercise Type  Light (walking / raking leaves)    How many days per week to you exercise?  7    How many minutes per day do you exercise?  20    Total minutes per week of exercise  140      Patient Education   Previous Diabetes Education  No       Learning Objective:  Patient will have a greater understanding of diabetes self-management. Patient education plan  is to attend individual and/or group sessions per assessed needs and concerns.   Plan:   Patient Instructions  Goals:  Follow Diabetes Meal Plan as instructed  Eat 3 meals and 2 snacks, every 3-5 hrs  Aim to have 1/2 plate of non-starchy vegetables, 1/4 plate of starch/grain, and 1/4 plate protein for lunch and dinner   Snacks to include source of carbohydrates + protein such as fruit + nuts or fruit + cheese or peanut butter crackers or cheese and crackers or graham cracker + peanut butter  Add lean protein foods to meals/snacks  Monitor glucose levels as instructed by your doctor  Aim for 30 mins of physical activity daily  Bring food record and glucose log to your next nutrition visit     Expected Outcomes:     Education material provided: ADA - How to Thrive: A Guide for Your Journey with Diabetes  If problems or questions, patient to contact team via:  Phone and Email  Future DSME appointment: -

## 2019-04-26 NOTE — Telephone Encounter (Signed)
I have spoken with Maudie Mercury from [facility] about this CMN and she will let Magda Paganini know to fax it directly to my fax # [phone]

## 2019-05-24 ENCOUNTER — Ambulatory Visit: Payer: Medicaid Other | Admitting: Internal Medicine

## 2019-05-24 ENCOUNTER — Other Ambulatory Visit: Payer: Self-pay

## 2019-05-24 ENCOUNTER — Encounter: Payer: Self-pay | Admitting: Internal Medicine

## 2019-05-24 DIAGNOSIS — G4733 Obstructive sleep apnea (adult) (pediatric): Secondary | ICD-10-CM

## 2019-05-24 DIAGNOSIS — J9611 Chronic respiratory failure with hypoxia: Secondary | ICD-10-CM

## 2019-05-24 DIAGNOSIS — E662 Morbid (severe) obesity with alveolar hypoventilation: Secondary | ICD-10-CM

## 2019-05-24 NOTE — Assessment & Plan Note (Signed)
He is working with nutritionist and walking regularly. This will help his DM and OSA.

## 2019-05-24 NOTE — Assessment & Plan Note (Signed)
No longer O2 dependent

## 2019-05-24 NOTE — Progress Notes (Signed)
HPI  M former smoker followed for COPD, chronic hypoxic respiratory failure OSA, OHS, complicated by morbid obesity, DM2, CHF, anemia PFT 09/21/13-severe obstruction with slight response to bronchodilator, restriction of exhaled volume, mild reduction diffusion NPSG 08/08/13- AHI 21.1/ hr, desaturation to 80%, body weight 385 lbs BIPAP titration 09/05/15 optimal pressure 22/18, body weight 365 lbs PFT 10/11/2018- Severe obstruction with response to dilator. Severe restriction, diffusion moderately reduced. FVC 2.16/ 57%, FEV1 1.77/ 59%, Ratio 0.82, TLC 66%, DLCO 66% --------------------------------------------------------   01/24/19-  59 year old male former smoker followed for COPD, hypoxic respiratory failure/ tracheostomy, OSA, complicated by morbid obesity, OHS, DM, CHF/ atherosclerosis, Goiter,  BIPAP 22/18/ [facility] Download compliance 63%, AHI 5.5/ hr Body weight today 354 lbs Needs new mask- patching together. Reviewed download and discussed compliance. Easy dyspnea walking short distances with no pedal edema. Wheezes- out of inhaled meds. Discussed alternatives. Discussed weight and discussed Covid vaccine. He is afraid to leave his apartment.  He shows me a 2 cm nodule at base of spine/ gluteal cleft that someone "burned off once, but it came back, and sometimes bleeds". PFT 10/11/2018- Severe obstruction with response to dilator. Severe restriction, diffusion moderately reduced. FVC 2.16/ 57%, FEV1 1.77/ 59%, Ratio 0.82, TLC 66%, DLCO 66%  05/24/19-  59 year old male former smoker followed for COPD, hypoxic respiratory failure/ tracheostomy, OSA, complicated by morbid obesity, OHS, DM2, CHF/ atherosclerosis, Goiter,  BIPAP 22/18/ [facility] [employer] Download compliance 100%, AHI 3.5/ hr Body weight today 335 lbs Symbicort 160- using as an occasional/ rescue inhaler,  He is very comfortable with his BIPAP and doesn't sleep without it.  No Covax yet- encouraged. Since last here, hosp for  hyperglycemia. Now working with a nutritionist and weight is down almost 20 lbs. Walking a lot.   ROS-see HPI    + = positive Constitutional:    +weight loss, night sweats, fevers, chills, + fatigue, lassitude. HEENT:   No-  headaches, difficulty swallowing, tooth/dental problems, sore throat,      sneezing, itching, ear ache, nasal congestion, post nasal drip,  CV:  No-   chest pain, +orthopnea, PND, swelling in lower extremities, no-anasarca, dizziness, palpitations Resp: +shortness of breath with exertion or at rest.                productive cough,  + non-productive cough,  No- coughing up of blood.              No-   change in color of mucus.   wheezing.   Skin: No-   rash or lesions. GI:  No-   heartburn, indigestion, abdominal pain,no- nausea, vomiting,  GU:  MS:  + joint pain or swelling.  . Neuro-     nothing unusual Psych:  No- change in mood or affect. No depression or anxiety.  No memory loss.  OBJ- Physical Exam General- Alert, Oriented, Affect-appropriate, Distress- none acute,  + morbidly obese,   . 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 Lymphadenopathy- none Head- atraumatic            Eyes- Gross vision intact, PERRLA, conjunctivae and secretions clear            Ears- Hearing, canals-normal            Nose- Clear, no-Septal dev, mucus, polyps, erosion, perforation             Throat- Mallampati IV , mucosa clear , drainage- none, tonsils- atrophic,  Neck- + Breathing unlabored at  rest on room air. Healed tracheostomy scar. Chest - symmetrical excursion , unlabored           Heart/CV- RRR, distant , no murmur , no gallop  , no rub, nl s1 s2                           - JVD- none , edema-none, stasis changes- none, varices- none           Lung- diminished/clear , wheeze- none, cough-none , dullness-none, rub- none                                   Chest wall-  Abd-  Br/ Gen/ Rectal- Not done, not indicated Extrem- cyanosis- none,  clubbing, none, atrophy- none, strength- nl Neuro- grossly intact to observation

## 2019-05-24 NOTE — Patient Instructions (Signed)
I am very proud of how well you are doing. We can continue BIPAP 22/18, mask of choice, humidifier, supplies, AirView/card  Keep working on you weight- that will make all the difference.  Please call if we can help

## 2019-05-24 NOTE — Assessment & Plan Note (Signed)
Improved with weight loss. He is motivated to keep working at this.

## 2019-05-24 NOTE — Assessment & Plan Note (Signed)
Benefits from BIPAP with excellent compliance. Well- motivated Plan- continue BIPAP 22/18

## 2019-05-25 ENCOUNTER — Other Ambulatory Visit: Payer: Self-pay

## 2019-05-25 ENCOUNTER — Encounter: Payer: Medicaid Other | Attending: Internal Medicine | Admitting: Registered"

## 2019-05-25 ENCOUNTER — Encounter: Payer: Self-pay | Admitting: Registered"

## 2019-05-25 DIAGNOSIS — E119 Type 2 diabetes mellitus without complications: Secondary | ICD-10-CM | POA: Diagnosis present

## 2019-05-25 NOTE — Patient Instructions (Signed)
Goals:  Follow Diabetes Meal Plan as instructed  Eat 3 meals and 2 snacks, every 3-5 hrs  Add lean protein foods to meals/snacks  Monitor glucose levels as instructed by your doctor  Aim for 30 mins of physical activity daily  Bring food record and glucose log to your next nutrition visit  Take medications as prescribed  Speak PCP about further questions and concerns

## 2019-05-25 NOTE — Progress Notes (Signed)
Diabetes Self-Management Education  Visit Type:  Follow-up  Appt. Start Time: 11:13 Appt. End Time: 12:06  05/25/2019  [name], identified by name and date of birth, is a 59 y.o. male with a diagnosis of Diabetes: Type 2.   ASSESSMENT  Pt states he went to the doctor recently. States he has lost 6 lbs and everything is looking good. Reports he has bought a grill and grilling food for the last few days: chicken wings, steak, burgers, and shrimp.   Reports he checks BS 2x/day: FBS (115-214) and after lunch (<300). States he has not been taking metformin as prescribed because it causes diarrhea.    There were no vitals taken for this visit. There is no height or weight on file to calculate BMI.   Diabetes Self-Management Education - 05/25/19 1117      Psychosocial Assessment   Patient Belief/Attitude about Diabetes  Motivated to manage diabetes    Self-care barriers  None    Self-management support  Doctor's office    Patient Concerns  Nutrition/Meal planning    Special Needs  None    Preferred Learning Style  No preference indicated    Learning Readiness  Change in progress      Complications   How often do you check your blood sugar?  1-2 times/day    Fasting Blood glucose range (mg/dL)  70-129;130-179;180-200    Postprandial Blood glucose range (mg/dL)  >200    Number of hypoglycemic episodes per month  0    Number of hyperglycemic episodes per week  3    Can you tell when your blood sugar is high?  Yes    What do you do if your blood sugar is high?  drink water    Have you had a dilated eye exam in the past 12 months?  No    Have you had a dental exam in the past 12 months?  No    Are you checking your feet?  Yes    How many days per week are you checking your feet?  4      Dietary Intake   Breakfast  2 eggs + 2 slices of toast + sausage + water    Snack (morning)  individual bag of plain chips    Lunch  burger + water    Snack (afternoon)  popcorn     Dinner  cababge + pinto beans + corn bread + baked pork chops    Beverage(s)  water      Exercise   Exercise Type  Light (walking / raking leaves)    How many days per week to you exercise?  7    How many minutes per day do you exercise?  20    Total minutes per week of exercise  140      Patient Education   Previous Diabetes Education  Yes (please comment)      Post-Education Assessment   Patient understands the diabetes disease and treatment process.  Demonstrates understanding / competency    Patient understands incorporating nutritional management into lifestyle.  Demonstrates understanding / competency    Patient undertands incorporating physical activity into lifestyle.  Demonstrates understanding / competency    Patient understands using medications safely.  Demonstrates understanding / competency    Patient understands monitoring blood glucose, interpreting and using results  Demonstrates understanding / competency    Patient understands prevention, detection, and treatment of acute complications.  Demonstrates understanding / competency    Patient  understands prevention, detection, and treatment of chronic complications.  Demonstrates understanding / competency    Patient understands how to develop strategies to address psychosocial issues.  Demonstrates understanding / competency    Patient understands how to develop strategies to promote health/change behavior.  Demonstrates understanding / competency      Outcomes   Program Status  Completed      Subsequent Visit   Since your last visit have you continued or begun to take your medications as prescribed?  Yes    Since your last visit have you had your blood pressure checked?  Yes    Is your most recent blood pressure lower, unchanged, or higher since your last visit?  Unchanged    Since your last visit have you experienced any weight changes?  Loss    Weight Loss (lbs)  6    Since your last visit, are you checking your  blood glucose at least once a day?  Yes       Learning Objective:  Patient will have a greater understanding of diabetes self-management. Patient education plan is to attend individual and/or group sessions per assessed needs and concerns.   Plan:   Patient Instructions  Goals:  Follow Diabetes Meal Plan as instructed  Eat 3 meals and 2 snacks, every 3-5 hrs  Add lean protein foods to meals/snacks  Monitor glucose levels as instructed by your doctor  Aim for 30 mins of physical activity daily  Bring food record and glucose log to your next nutrition visit  Take medications as prescribed  Speak PCP about further questions and concerns     Expected Outcomes:  Demonstrated interest in learning. Expect positive outcomes  Education material provided: none  If problems or questions, patient to contact team via:  Phone and Email  Future DSME appointment: - 4-6 wks

## 2019-06-22 ENCOUNTER — Encounter: Payer: Self-pay | Admitting: Registered"

## 2019-06-22 ENCOUNTER — Encounter: Payer: Medicaid Other | Attending: Internal Medicine | Admitting: Registered"

## 2019-06-22 ENCOUNTER — Other Ambulatory Visit: Payer: Self-pay

## 2019-06-22 DIAGNOSIS — E119 Type 2 diabetes mellitus without complications: Secondary | ICD-10-CM | POA: Diagnosis not present

## 2019-06-22 NOTE — Patient Instructions (Addendum)
-   Aim to have vegetables with lunch and dinner.   Pick up [employer] salads at grocery store   Have side salad when ordering from [facility]  Use steammable vegetables at home as option

## 2019-06-22 NOTE — Progress Notes (Signed)
Diabetes Self-Management Education  Visit Type:     Appt. Start Time: 11:25 Appt. End Time: 11:51  06/22/2019  [name], identified by name and date of birth, is a 59 y.o. male with a diagnosis of Diabetes:  .   ASSESSMENT  States he has cable now and has been staying up late watching tv until around 1 am. States he is used to going to bed around 10:30 pm. States he has been tired lately. Reports he ate 4 chicken tenders, 2 slices of pizza, and mountain dew late last night. Reports FBS today was >400 this morning.   States is BS have increased since previous visit. Still checking BS 1-2x/day: FBS (>200) and after lunch >200.   Reports he has been eating more grilled food lately. Neighbors have been grilling food. States he plans to have eye exam and will have brother take him this week. Reports he has been eating breakfast for lunch because he is not hungry when he first wakes up. States he has been walking every other day, 15-20 min. Able to walk longer without stopping.   There were no vitals taken for this visit. There is no height or weight on file to calculate BMI.   Diabetes Self-Management Education - [id number]      Complications   How often do you check your blood sugar?  1-2 times/day    Fasting Blood glucose range (mg/dL)  >200    Postprandial Blood glucose range (mg/dL)  >200    Number of hypoglycemic episodes per month  0    Number of hyperglycemic episodes per week  2    Can you tell when your blood sugar is high?  Yes    What do you do if your blood sugar is high?  drink water    Have you had a dilated eye exam in the past 12 months?  No    Have you had a dental exam in the past 12 months?  No    Are you checking your feet?  Yes    How many days per week are you checking your feet?  7      Dietary Intake   Breakfast  bacon + eggs + coffee or 2 sausage biscuits    Dinner  chicken tenders + pizza or [employer]-cheeseburger + chicken nuggets + sm fries +  soda    Beverage(s)  water, mountain dew      Individualized Goals (developed by patient)   Nutrition  General guidelines for healthy choices and portions discussed    Medications  take my medication as prescribed    Monitoring   test my blood glucose as discussed    Problem Solving  --   increase vegetable intake, get adequate sleep     Outcomes   Program Status  Completed      Subsequent Visit   Since your last visit have you continued or begun to take your medications as prescribed?  No    Since your last visit have you had your blood pressure checked?  --       Learning Objective:  Patient will have a greater understanding of diabetes self-management. Patient education plan is to attend individual and/or group sessions per assessed needs and concerns.   Plan:   Patient Instructions  - Aim to have vegetables with lunch and dinner.   Pick up [employer] salads at grocery store   Have side salad when ordering from [facility]  Use steammable vegetables at  home as option    Expected Outcomes:  Demonstrated interest in learning. Expect positive outcomes  Education material provided: none  If problems or questions, patient to contact team via:  Phone and Email  Future DSME appointment: - 4-6 wks

## 2019-06-24 IMAGING — DX DG CHEST 1V PORT
1 series · 1 of 1 positions shown · non-contrast
Comparison: 06/09/2017, 01/20/2017

CLINICAL DATA: Respiratory distress

EXAM:
PORTABLE CHEST 1 VIEW

[chest ap]
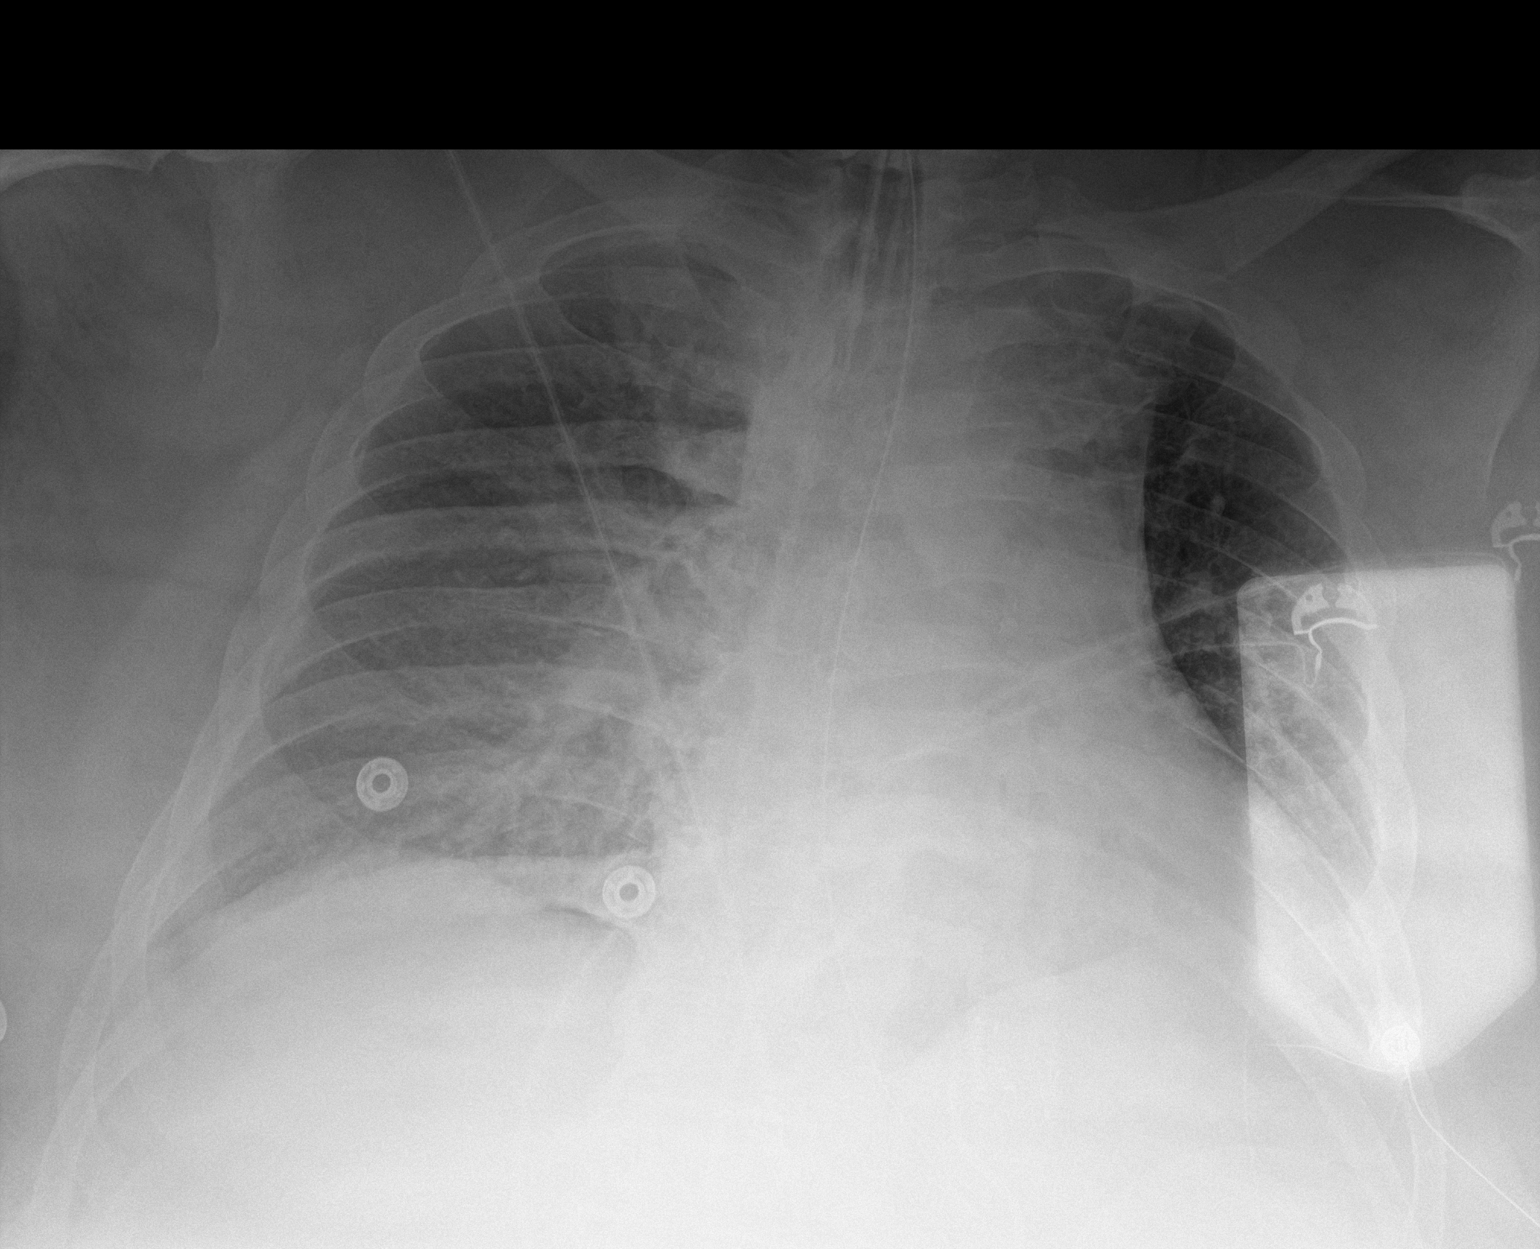

[1 of 1 positions shown; findings below may reference images not displayed]

FINDINGS: Endotracheal tube tip is about 3.4 cm superior to the carina.
Esophageal tube tip is below the diaphragm but not included.
Cardiomegaly with vascular congestion and mild perihilar edema. No
pleural effusion. No pneumothorax.
IMPRESSION: 1. Endotracheal tube tip about 3.4 cm superior to the carina
2. Cardiomegaly with vascular congestion and mild pulmonary edema

## 2019-06-29 IMAGING — DX DG CHEST 1V PORT
1 series · 1 of 1 positions shown · non-contrast
Comparison: 06/10/2017

CLINICAL DATA: Respiratory distress

EXAM:
PORTABLE CHEST 1 VIEW

[chest]
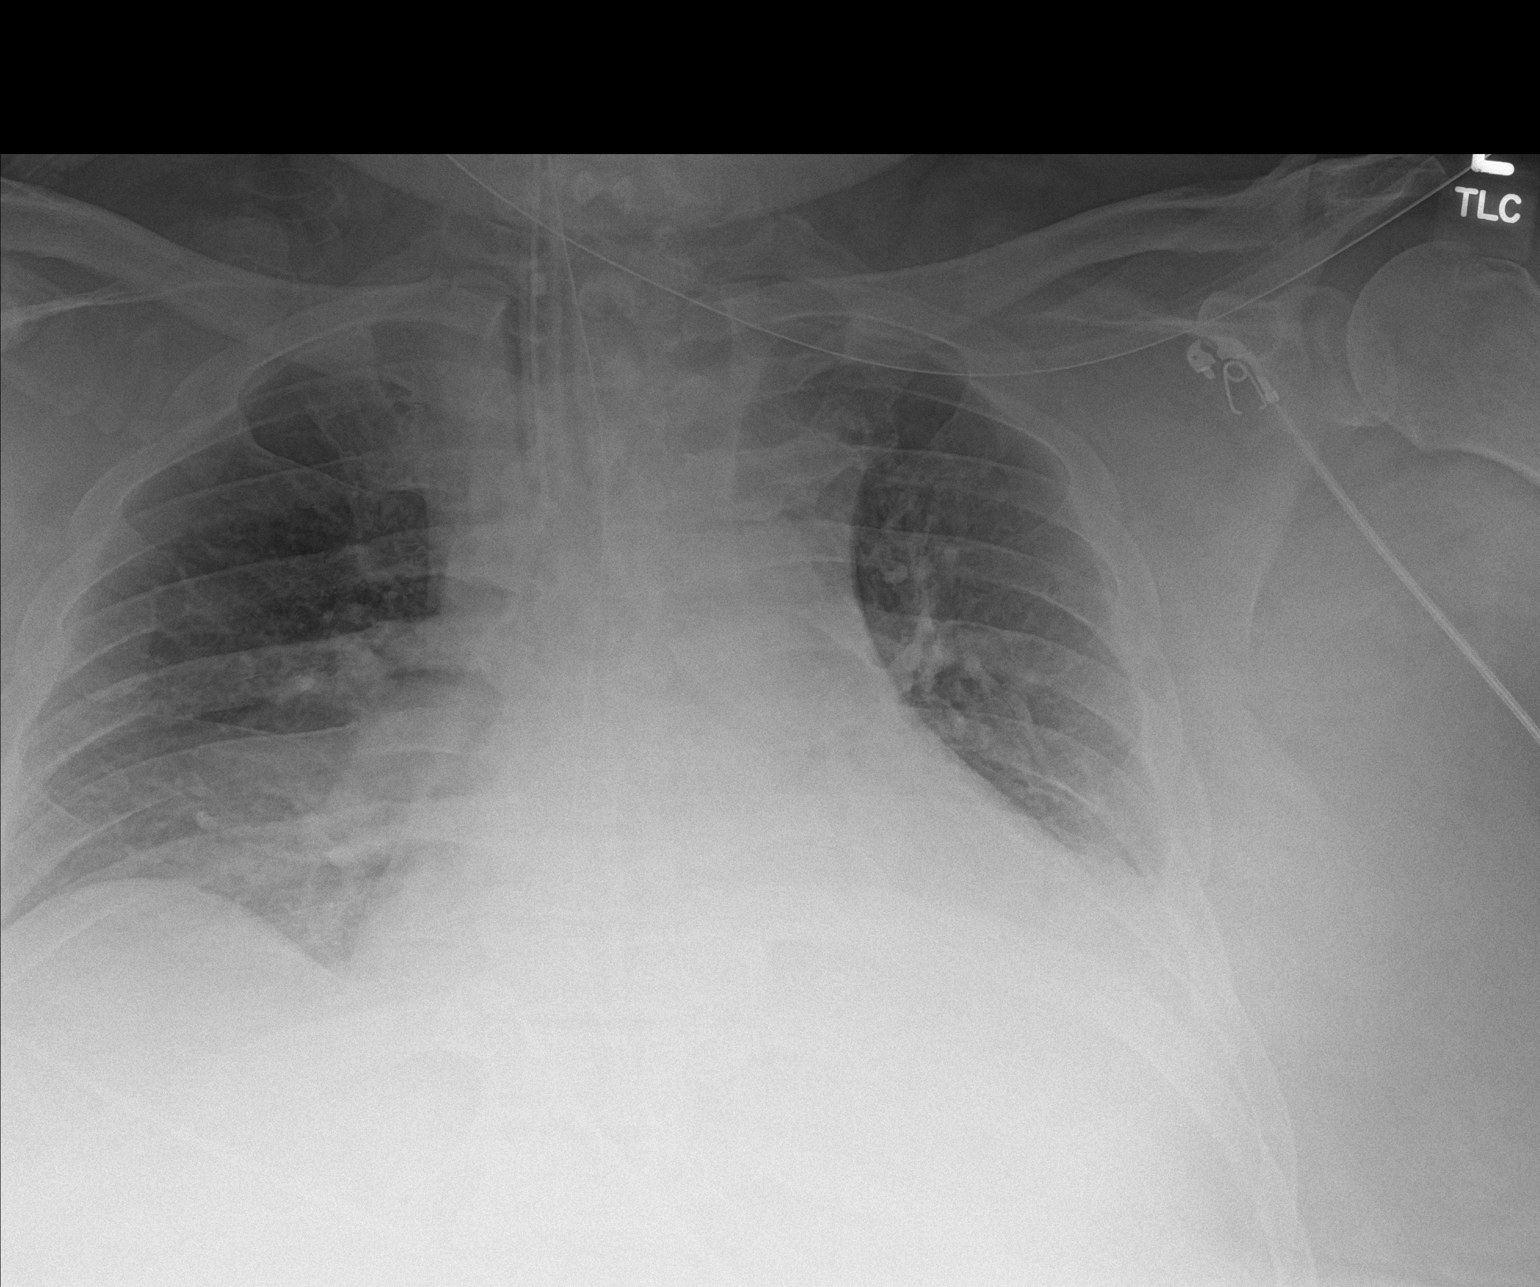

[1 of 1 positions shown; findings below may reference images not displayed]

FINDINGS: Endotracheal tube and NG tube are unchanged. Cardiomegaly with
vascular congestion. Bibasilar atelectasis. No overt edema. No
visible effusions or acute bony abnormality.
IMPRESSION: Cardiomegaly with vascular congestion and bibasilar atelectasis.

## 2019-06-30 IMAGING — DX DG CHEST 1V PORT
1 series · 1 of 1 positions shown · non-contrast
Comparison: 06/15/2017 and prior radiographs

CLINICAL DATA: Acute respiratory failure

EXAM:
PORTABLE CHEST 1 VIEW

[chest ap]
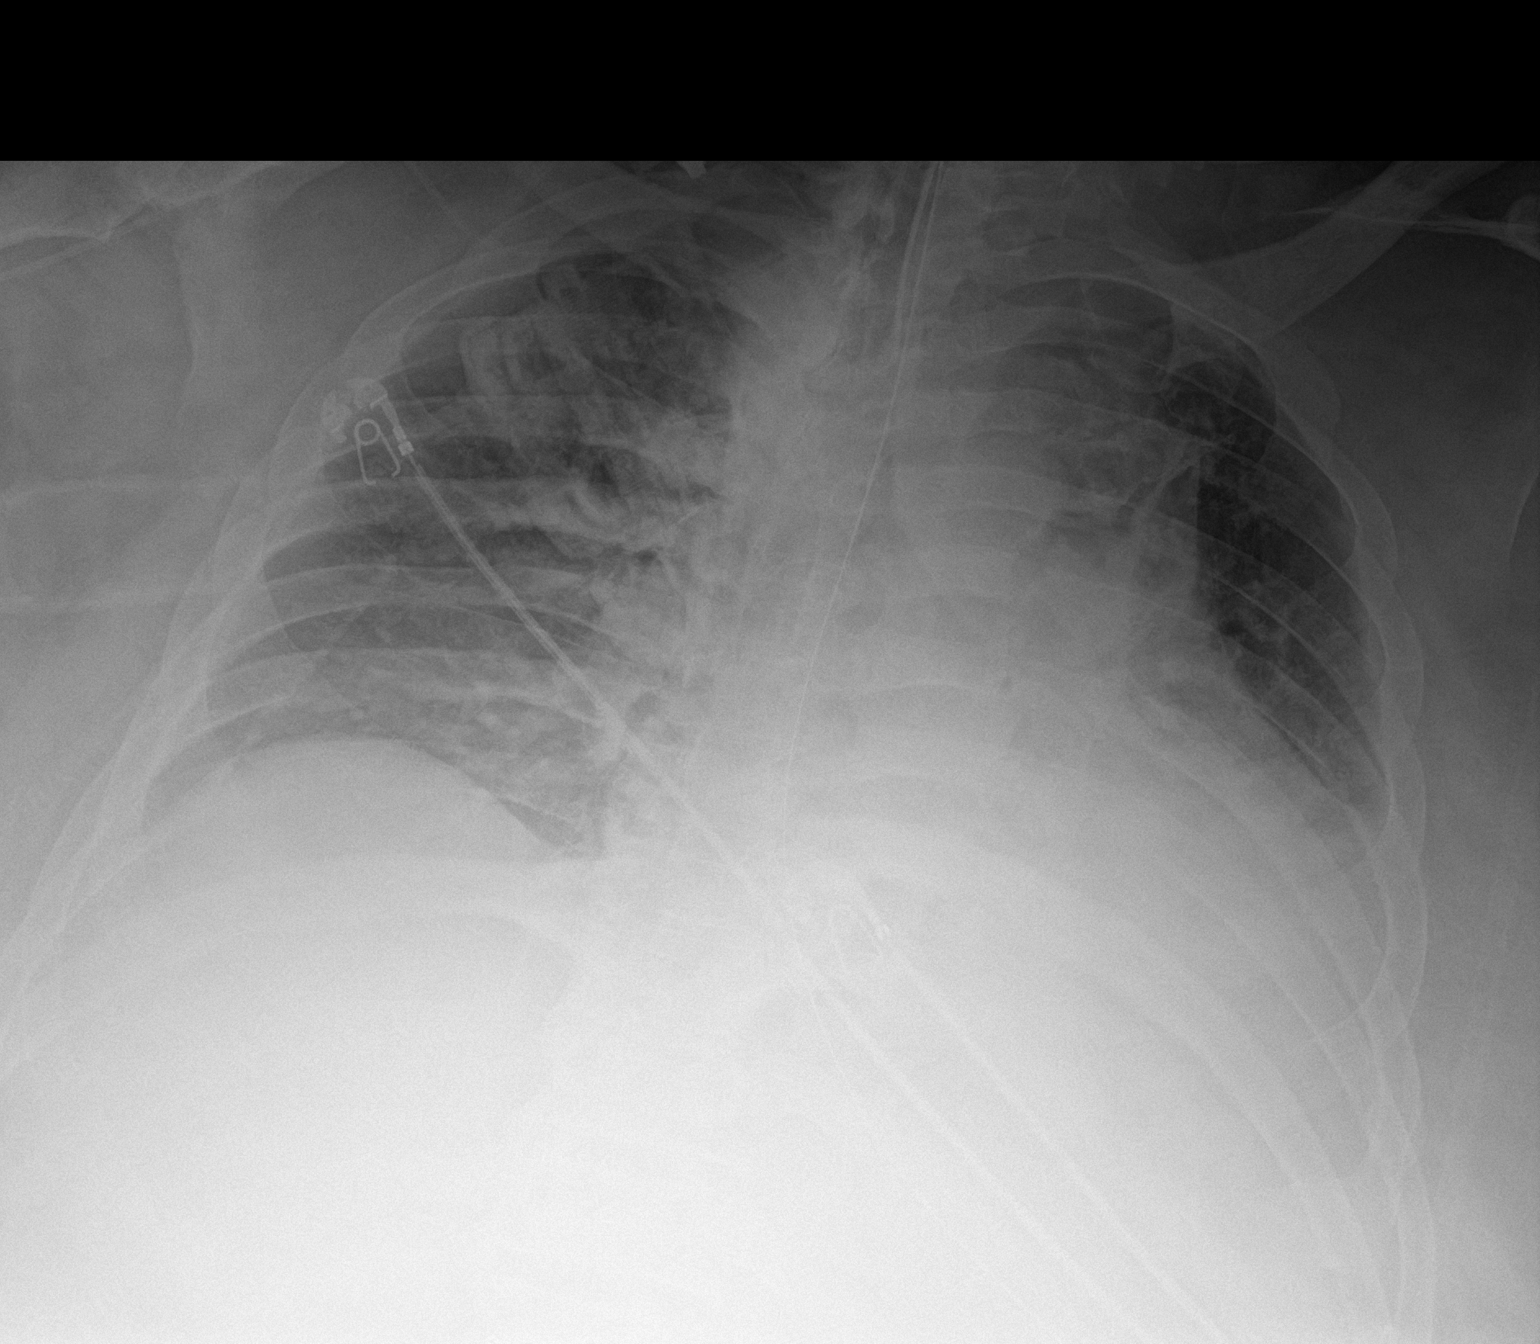

[1 of 1 positions shown; findings below may reference images not displayed]

FINDINGS: This is a low volume study.

An endotracheal tube with 4 cm above the carina, NG tube entering
the stomach with tip off the field of view again identified.

Cardiomegaly noted with mild pulmonary vascular congestion.

LEFT LOWER lung consolidation/atelectasis and RIGHT basilar
atelectasis noted.

No pneumothorax.

No other changes identified.
IMPRESSION: No significant change. Cardiomegaly, mild pulmonary vascular
congestion and LEFT LOWER lung consolidation/atelectasis.

## 2019-07-13 IMAGING — CT CT ABDOMEN W/O CM
2 of 4 series · 16 of 46 positions shown, 18 images · non-contrast
Comparison: None.

CLINICAL DATA: 56-year-old male with a history of dysphagia.
Referred for gastrostomy

EXAM:
CT ABDOMEN WITHOUT CONTRAST
TECHNIQUE: Multidetector CT imaging of the abdomen was performed following the
standard protocol without IV contrast.

[Series 3: a/p w/o 5mm · axial · non-contrast · 0.89mm/px · z∈[+1768,+2088]mm · 13 of 72 slices shown, 15 images]
[im 4/72  soft-tissue]
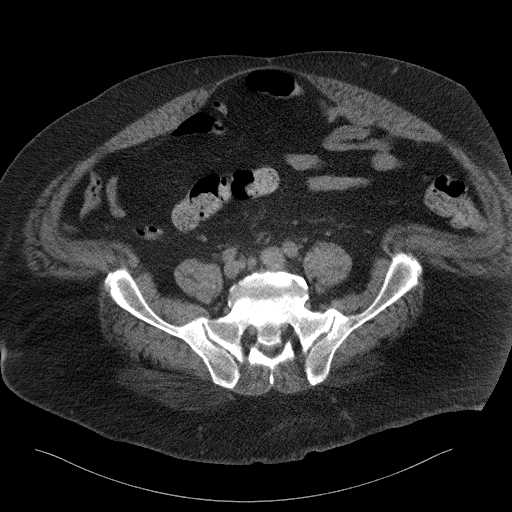
[im 4/72  bone]
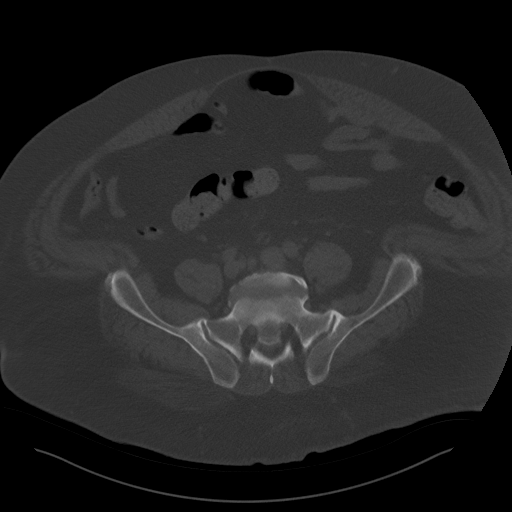
[im 10/72  soft-tissue]
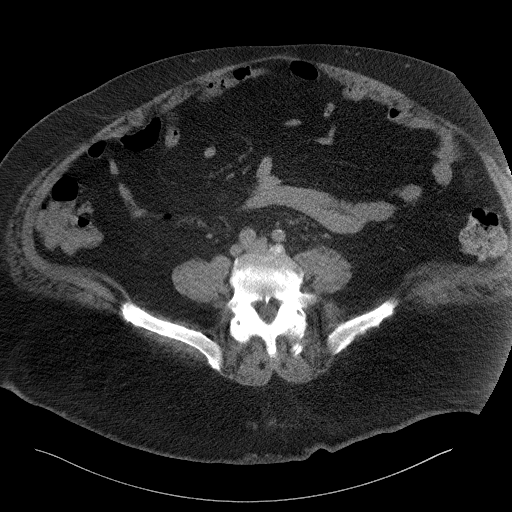
[im 17/72  soft-tissue]
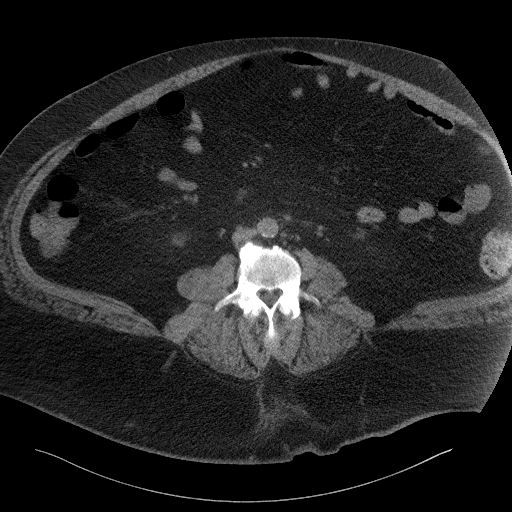
[im 20/72  soft-tissue]
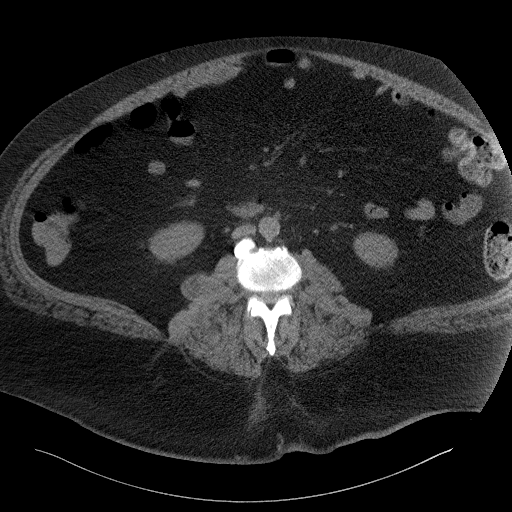
[im 26/72  soft-tissue]
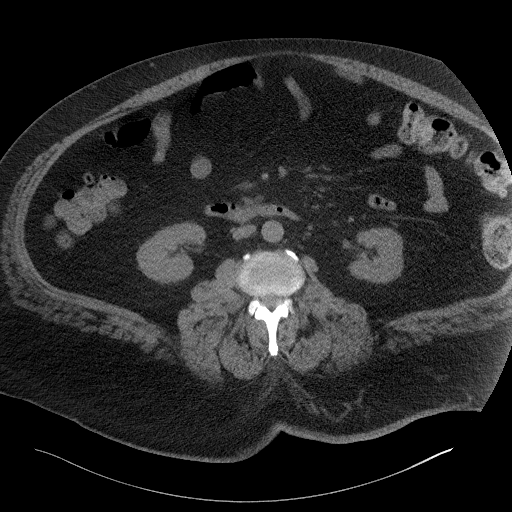
[im 30/72  soft-tissue]
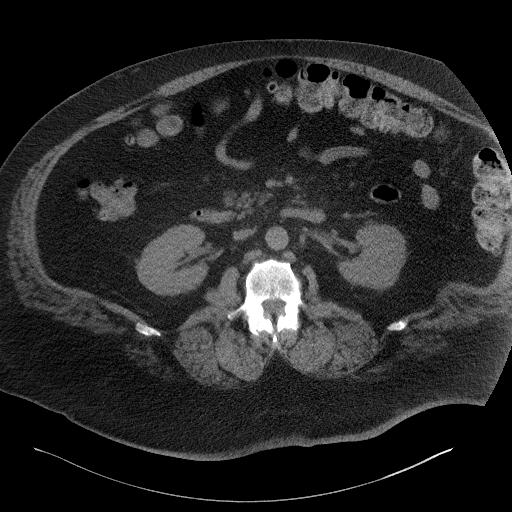
[im 36/72  soft-tissue]
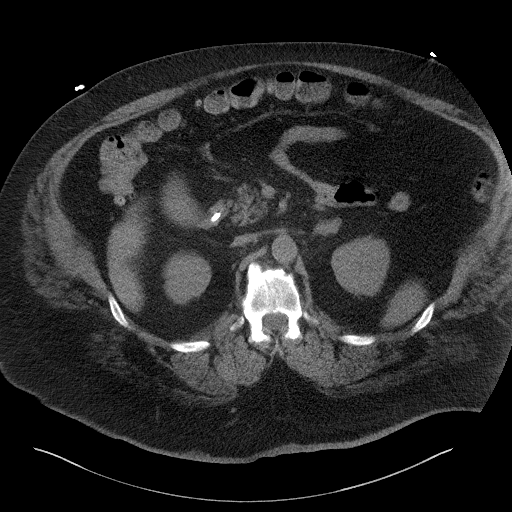
[im 42/72  soft-tissue]
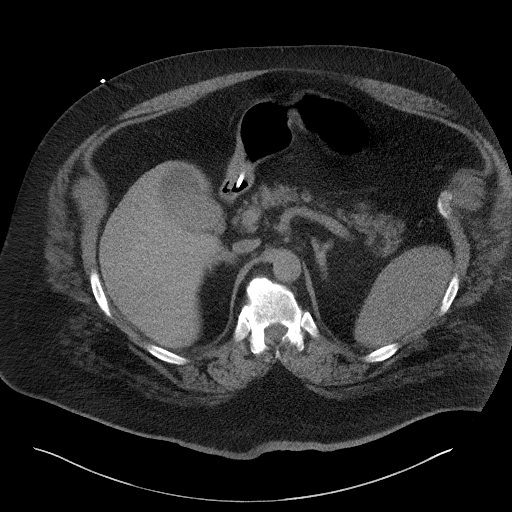
[im 46/72  soft-tissue]
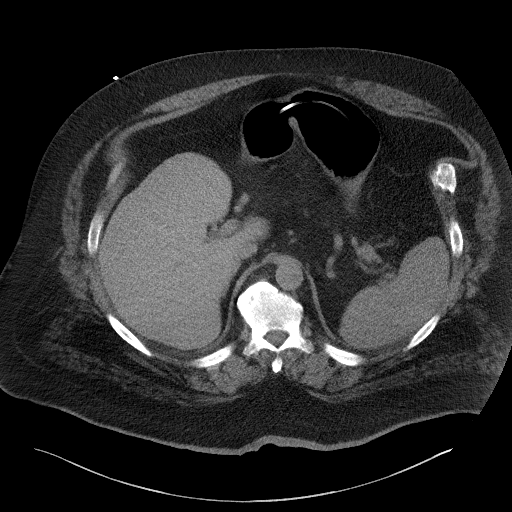
[im 46/72  bone]
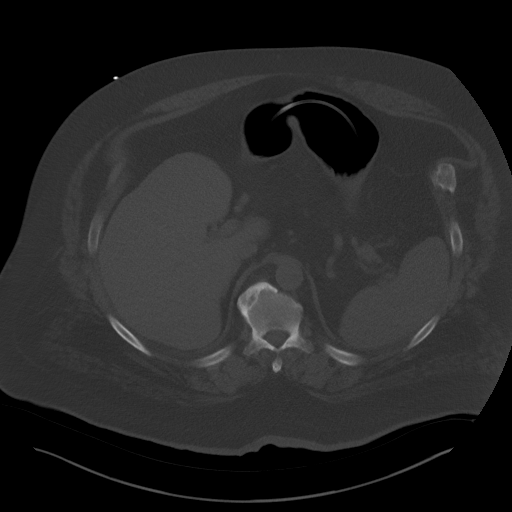
[im 52/72  soft-tissue]
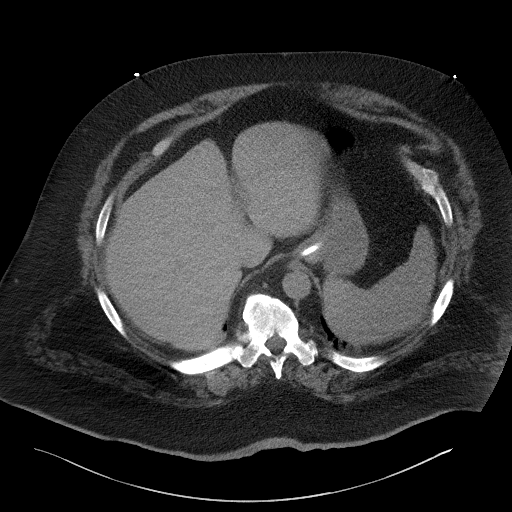
[im 55/72  soft-tissue]
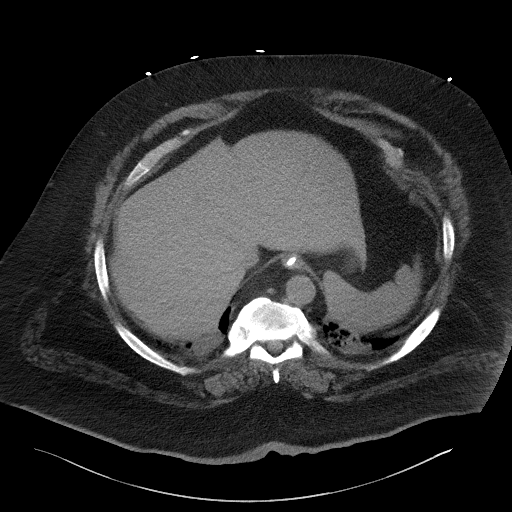
[im 62/72  soft-tissue]
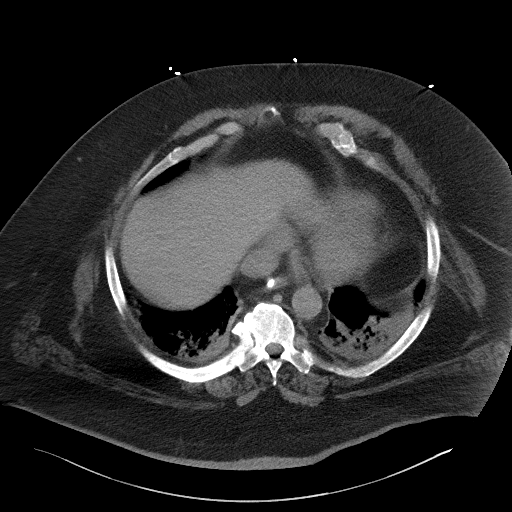
[im 68/72  soft-tissue]
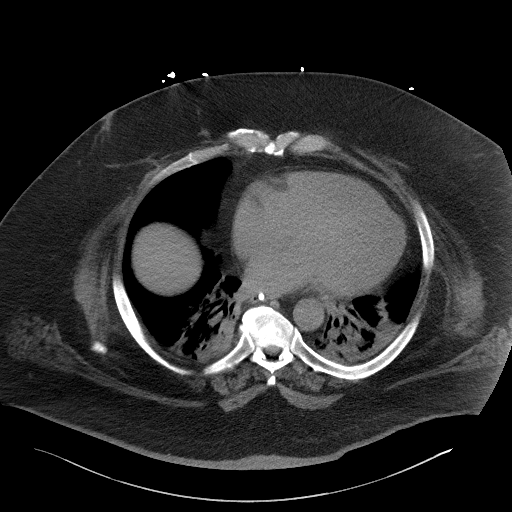

[Series 6: a/p w/o cor · coronal · non-contrast · 0.76mm/px · 3 of 178 slices shown]
[im 60/178  soft-tissue]
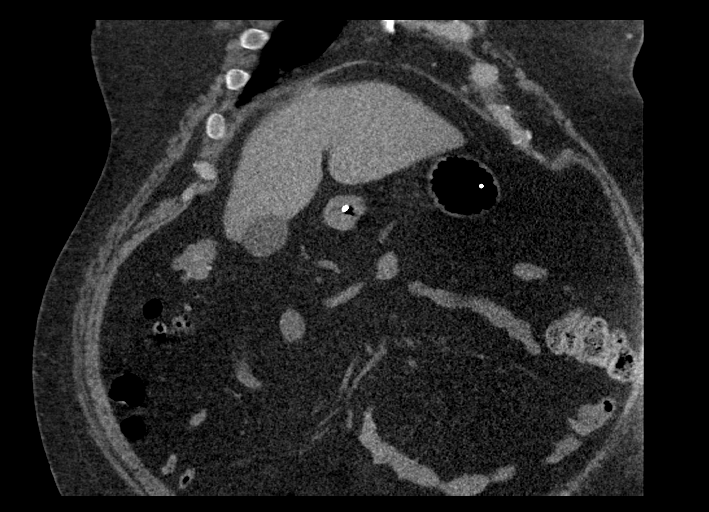
[im 79/178  soft-tissue]
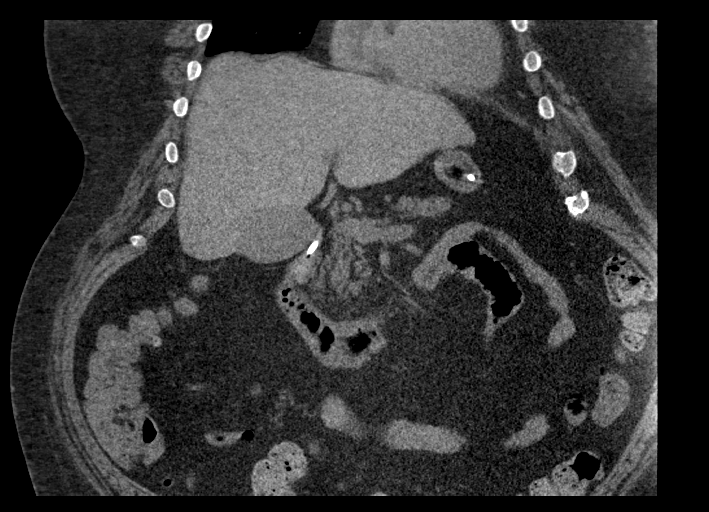
[im 99/178  soft-tissue]
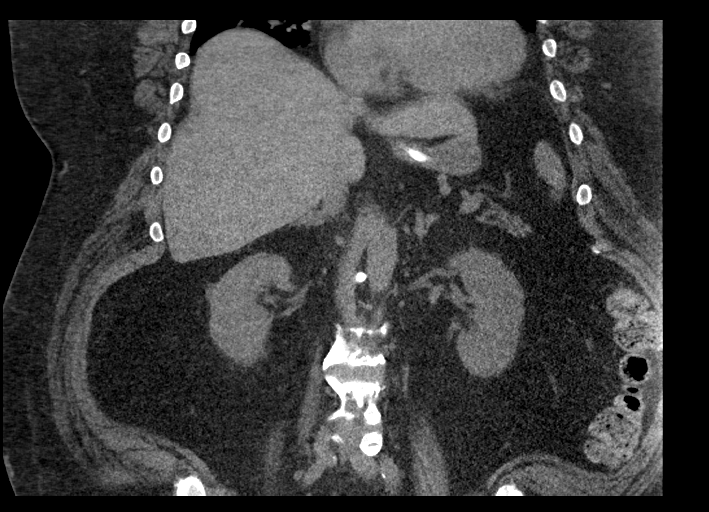

[16 of 46 positions shown; findings below may reference images not displayed]

FINDINGS: Lower chest: Atelectasis/scarring at the bilateral lung bases with
no significant consolidation. No pleural effusion.

Hepatobiliary: Unremarkable liver. Hyperdense material layered in
the dependent gallbladder, potentially sludge/microlithiasis. No
inflammatory changes.

Pancreas: Unremarkable pancreas

Spleen: Unremarkable spleen

Adrenals/Urinary Tract: Unremarkable adrenal glands.

Unremarkable appearance the bilateral kidneys with no hydronephrosis
or nephrolithiasis.

Stomach/Bowel: Post pyloric enteric feeding tube terminates within
the first portion the duodenum. Stomach is relatively decompressed.

Presence of diverticula within the colon. No inflammatory changes of
the colon or visualized small bowel.

Vascular/Lymphatic: No significant atherosclerotic changes. No
lymphadenopathy.

Other: No significant body wall edema. No ventral wall hernia.
Laxity of the anterior abdominal wall at the inferior aspects of the
study with no inflammation or involvement of the bowel.

Musculoskeletal: No acute displaced fracture. Degenerative changes
of the spine with flowing syndesmophytes.
IMPRESSION: No acute CT finding of the abdomen.

Likely biliary sludge/microlithiasis without evidence of acute
inflammation.

Post pyloric tube terminates within the first portion the duodenum.

Diverticular disease without evidence of acute diverticulitis.

Atelectasis/scarring at the lung bases.

## 2019-07-26 ENCOUNTER — Telehealth: Payer: Self-pay | Admitting: Internal Medicine

## 2019-07-26 DIAGNOSIS — G4733 Obstructive sleep apnea (adult) (pediatric): Secondary | ICD-10-CM

## 2019-07-26 NOTE — Telephone Encounter (Signed)
Need a order

## 2019-07-26 NOTE — Telephone Encounter (Signed)
Called patient, he is requesting order for new mask, order sent to DME.

## 2019-07-27 ENCOUNTER — Other Ambulatory Visit: Payer: Self-pay

## 2019-07-27 ENCOUNTER — Encounter: Payer: Self-pay | Admitting: Registered"

## 2019-07-27 ENCOUNTER — Encounter: Payer: Medicaid Other | Attending: Internal Medicine | Admitting: Registered"

## 2019-07-27 DIAGNOSIS — E119 Type 2 diabetes mellitus without complications: Secondary | ICD-10-CM | POA: Diagnosis present

## 2019-07-27 NOTE — Progress Notes (Signed)
  Diabetes Self-Management Education  Visit Type:   Diabetes  Appt. Start Time: 3:50  Appt. End Time: 4:08  07/27/2019  [name], identified by name and date of birth, is a 59 y.o. male with a diagnosis of Diabetes: Type 2.   ASSESSMENT  States his CPAP machine is broken and he is having a hard time breathing. States he is waiting to hear back from [employer]. States machine has been broken for about 2 weeks.   States his BS numbers have been crazy. States he checked FBS the other day and it was 313.   There were no vitals taken for this visit. There is no height or weight on file to calculate BMI.    Diabetes Self-Management Education - 83/38/25 0539      Complications   How often do you check your blood sugar? 1-2 times/day    Fasting Blood glucose range (mg/dL) >200    Number of hypoglycemic episodes per month 0    Number of hyperglycemic episodes per week 3    Can you tell when your blood sugar is high? Yes    What do you do if your blood sugar is high? drink water    Have you had a dilated eye exam in the past 12 months? No    Have you had a dental exam in the past 12 months? No    Are you checking your feet? Yes    How many days per week are you checking your feet? 7      Dietary Intake   Breakfast steak + cheese burrito + peach    Dinner baby back ribs + corn    Beverage(s) beer, water, sugar-free punch      Exercise   Exercise Type ADL's    How many days per week to you exercise? 0    How many minutes per day do you exercise? 0    Total minutes per week of exercise 0      Outcomes   Program Status Completed           Learning Objective:  Patient will have a greater understanding of diabetes self-management. Patient education plan is to attend individual and/or group sessions per assessed needs and concerns.   Plan:   Patient Instructions  - Aim to have vegetables with lunch and dinner such as broccoli, asparagus, cabbage, etc.       Expected Outcomes:  Demonstrated limited interest in learning.  Expect minimal changes  Education material provided: none  If problems or questions, patient to contact team via:  Phone and Email  Future DSME appointment: - 4-6 wks

## 2019-07-27 NOTE — Patient Instructions (Addendum)
-   Aim to have vegetables with lunch and dinner such as broccoli, asparagus, cabbage, etc.

## 2019-07-30 ENCOUNTER — Inpatient Hospital Stay (HOSPITAL_COMMUNITY)
Admission: EM | Admit: 2019-07-30 | Discharge: 2019-09-15 | DRG: 003 | Disposition: A | Discharge status: Rehabilitation Facility | Payer: Medicaid Other | Attending: Internal Medicine | Admitting: Internal Medicine

## 2019-07-30 ENCOUNTER — Emergency Department (HOSPITAL_COMMUNITY): Payer: Medicaid Other

## 2019-07-30 DIAGNOSIS — G92 Toxic encephalopathy: Secondary | ICD-10-CM | POA: Diagnosis not present

## 2019-07-30 DIAGNOSIS — N182 Chronic kidney disease, stage 2 (mild): Secondary | ICD-10-CM | POA: Diagnosis present

## 2019-07-30 DIAGNOSIS — Z87891 Personal history of nicotine dependence: Secondary | ICD-10-CM

## 2019-07-30 DIAGNOSIS — I130 Hypertensive heart and chronic kidney disease with heart failure and stage 1 through stage 4 chronic kidney disease, or unspecified chronic kidney disease: Secondary | ICD-10-CM | POA: Diagnosis present

## 2019-07-30 DIAGNOSIS — E781 Pure hyperglyceridemia: Secondary | ICD-10-CM | POA: Diagnosis present

## 2019-07-30 DIAGNOSIS — Y848 Other medical procedures as the cause of abnormal reaction of the patient, or of later complication, without mention of misadventure at the time of the procedure: Secondary | ICD-10-CM | POA: Diagnosis not present

## 2019-07-30 DIAGNOSIS — J9509 Other tracheostomy complication: Secondary | ICD-10-CM | POA: Diagnosis not present

## 2019-07-30 DIAGNOSIS — Z4659 Encounter for fitting and adjustment of other gastrointestinal appliance and device: Secondary | ICD-10-CM

## 2019-07-30 DIAGNOSIS — J441 Chronic obstructive pulmonary disease with (acute) exacerbation: Secondary | ICD-10-CM | POA: Diagnosis present

## 2019-07-30 DIAGNOSIS — E871 Hypo-osmolality and hyponatremia: Secondary | ICD-10-CM | POA: Diagnosis not present

## 2019-07-30 DIAGNOSIS — E870 Hyperosmolality and hypernatremia: Secondary | ICD-10-CM | POA: Diagnosis present

## 2019-07-30 DIAGNOSIS — Z8249 Family history of ischemic heart disease and other diseases of the circulatory system: Secondary | ICD-10-CM

## 2019-07-30 DIAGNOSIS — J810 Acute pulmonary edema: Secondary | ICD-10-CM | POA: Diagnosis not present

## 2019-07-30 DIAGNOSIS — Z930 Tracheostomy status: Secondary | ICD-10-CM | POA: Diagnosis not present

## 2019-07-30 DIAGNOSIS — K219 Gastro-esophageal reflux disease without esophagitis: Secondary | ICD-10-CM | POA: Diagnosis present

## 2019-07-30 DIAGNOSIS — Z9911 Dependence on respirator [ventilator] status: Secondary | ICD-10-CM | POA: Diagnosis not present

## 2019-07-30 DIAGNOSIS — D62 Acute posthemorrhagic anemia: Secondary | ICD-10-CM | POA: Diagnosis not present

## 2019-07-30 DIAGNOSIS — E890 Postprocedural hypothyroidism: Secondary | ICD-10-CM | POA: Diagnosis not present

## 2019-07-30 DIAGNOSIS — J9503 Malfunction of tracheostomy stoma: Secondary | ICD-10-CM | POA: Diagnosis not present

## 2019-07-30 DIAGNOSIS — M109 Gout, unspecified: Secondary | ICD-10-CM | POA: Diagnosis present

## 2019-07-30 DIAGNOSIS — J40 Bronchitis, not specified as acute or chronic: Secondary | ICD-10-CM | POA: Diagnosis not present

## 2019-07-30 DIAGNOSIS — E662 Morbid (severe) obesity with alveolar hypoventilation: Secondary | ICD-10-CM | POA: Diagnosis present

## 2019-07-30 DIAGNOSIS — J9622 Acute and chronic respiratory failure with hypercapnia: Secondary | ICD-10-CM | POA: Diagnosis present

## 2019-07-30 DIAGNOSIS — J9620 Acute and chronic respiratory failure, unspecified whether with hypoxia or hypercapnia: Secondary | ICD-10-CM | POA: Diagnosis present

## 2019-07-30 DIAGNOSIS — J440 Chronic obstructive pulmonary disease with acute lower respiratory infection: Secondary | ICD-10-CM | POA: Diagnosis present

## 2019-07-30 DIAGNOSIS — E785 Hyperlipidemia, unspecified: Secondary | ICD-10-CM | POA: Diagnosis present

## 2019-07-30 DIAGNOSIS — E049 Nontoxic goiter, unspecified: Principal | ICD-10-CM | POA: Diagnosis present

## 2019-07-30 DIAGNOSIS — Z9289 Personal history of other medical treatment: Secondary | ICD-10-CM

## 2019-07-30 DIAGNOSIS — Z430 Encounter for attention to tracheostomy: Secondary | ICD-10-CM | POA: Diagnosis not present

## 2019-07-30 DIAGNOSIS — I5033 Acute on chronic diastolic (congestive) heart failure: Secondary | ICD-10-CM | POA: Diagnosis present

## 2019-07-30 DIAGNOSIS — Z833 Family history of diabetes mellitus: Secondary | ICD-10-CM

## 2019-07-30 DIAGNOSIS — L89300 Pressure ulcer of unspecified buttock, unstageable: Secondary | ICD-10-CM | POA: Diagnosis not present

## 2019-07-30 DIAGNOSIS — Z9981 Dependence on supplemental oxygen: Secondary | ICD-10-CM

## 2019-07-30 DIAGNOSIS — E1165 Type 2 diabetes mellitus with hyperglycemia: Secondary | ICD-10-CM

## 2019-07-30 DIAGNOSIS — J9621 Acute and chronic respiratory failure with hypoxia: Secondary | ICD-10-CM | POA: Diagnosis present

## 2019-07-30 DIAGNOSIS — Z20822 Contact with and (suspected) exposure to covid-19: Secondary | ICD-10-CM | POA: Diagnosis present

## 2019-07-30 DIAGNOSIS — F329 Major depressive disorder, single episode, unspecified: Secondary | ICD-10-CM | POA: Diagnosis present

## 2019-07-30 DIAGNOSIS — R5381 Other malaise: Secondary | ICD-10-CM | POA: Diagnosis not present

## 2019-07-30 DIAGNOSIS — R092 Respiratory arrest: Secondary | ICD-10-CM

## 2019-07-30 DIAGNOSIS — E876 Hypokalemia: Secondary | ICD-10-CM | POA: Diagnosis present

## 2019-07-30 DIAGNOSIS — R0602 Shortness of breath: Secondary | ICD-10-CM | POA: Diagnosis present

## 2019-07-30 DIAGNOSIS — J811 Chronic pulmonary edema: Secondary | ICD-10-CM

## 2019-07-30 DIAGNOSIS — N179 Acute kidney failure, unspecified: Secondary | ICD-10-CM | POA: Diagnosis present

## 2019-07-30 DIAGNOSIS — J398 Other specified diseases of upper respiratory tract: Secondary | ICD-10-CM | POA: Diagnosis not present

## 2019-07-30 DIAGNOSIS — Z978 Presence of other specified devices: Secondary | ICD-10-CM | POA: Diagnosis not present

## 2019-07-30 DIAGNOSIS — J449 Chronic obstructive pulmonary disease, unspecified: Secondary | ICD-10-CM | POA: Diagnosis present

## 2019-07-30 DIAGNOSIS — T17490A Other foreign object in trachea causing asphyxiation, initial encounter: Secondary | ICD-10-CM | POA: Diagnosis not present

## 2019-07-30 DIAGNOSIS — D631 Anemia in chronic kidney disease: Secondary | ICD-10-CM | POA: Diagnosis present

## 2019-07-30 DIAGNOSIS — J9601 Acute respiratory failure with hypoxia: Secondary | ICD-10-CM | POA: Diagnosis not present

## 2019-07-30 DIAGNOSIS — Z781 Physical restraint status: Secondary | ICD-10-CM

## 2019-07-30 DIAGNOSIS — N390 Urinary tract infection, site not specified: Secondary | ICD-10-CM | POA: Diagnosis not present

## 2019-07-30 DIAGNOSIS — I5032 Chronic diastolic (congestive) heart failure: Secondary | ICD-10-CM | POA: Diagnosis not present

## 2019-07-30 DIAGNOSIS — E872 Acidosis: Secondary | ICD-10-CM | POA: Diagnosis present

## 2019-07-30 DIAGNOSIS — J9602 Acute respiratory failure with hypercapnia: Secondary | ICD-10-CM | POA: Diagnosis not present

## 2019-07-30 DIAGNOSIS — J9600 Acute respiratory failure, unspecified whether with hypoxia or hypercapnia: Secondary | ICD-10-CM | POA: Diagnosis present

## 2019-07-30 DIAGNOSIS — Z7951 Long term (current) use of inhaled steroids: Secondary | ICD-10-CM

## 2019-07-30 DIAGNOSIS — F419 Anxiety disorder, unspecified: Secondary | ICD-10-CM | POA: Diagnosis present

## 2019-07-30 DIAGNOSIS — J95851 Ventilator associated pneumonia: Secondary | ICD-10-CM | POA: Diagnosis not present

## 2019-07-30 DIAGNOSIS — J988 Other specified respiratory disorders: Secondary | ICD-10-CM

## 2019-07-30 DIAGNOSIS — Y95 Nosocomial condition: Secondary | ICD-10-CM | POA: Diagnosis not present

## 2019-07-30 DIAGNOSIS — Z931 Gastrostomy status: Secondary | ICD-10-CM

## 2019-07-30 DIAGNOSIS — Z794 Long term (current) use of insulin: Secondary | ICD-10-CM

## 2019-07-30 DIAGNOSIS — Z79899 Other long term (current) drug therapy: Secondary | ICD-10-CM

## 2019-07-30 DIAGNOSIS — L8990 Pressure ulcer of unspecified site, unspecified stage: Secondary | ICD-10-CM

## 2019-07-30 DIAGNOSIS — E861 Hypovolemia: Secondary | ICD-10-CM | POA: Diagnosis present

## 2019-07-30 DIAGNOSIS — Z825 Family history of asthma and other chronic lower respiratory diseases: Secondary | ICD-10-CM

## 2019-07-30 DIAGNOSIS — R319 Hematuria, unspecified: Secondary | ICD-10-CM | POA: Diagnosis not present

## 2019-07-30 DIAGNOSIS — G4733 Obstructive sleep apnea (adult) (pediatric): Secondary | ICD-10-CM | POA: Diagnosis not present

## 2019-07-30 DIAGNOSIS — E1142 Type 2 diabetes mellitus with diabetic polyneuropathy: Secondary | ICD-10-CM | POA: Diagnosis present

## 2019-07-30 DIAGNOSIS — R34 Anuria and oliguria: Secondary | ICD-10-CM | POA: Diagnosis present

## 2019-07-30 DIAGNOSIS — E11649 Type 2 diabetes mellitus with hypoglycemia without coma: Secondary | ICD-10-CM | POA: Diagnosis not present

## 2019-07-30 DIAGNOSIS — L98411 Non-pressure chronic ulcer of buttock limited to breakdown of skin: Secondary | ICD-10-CM | POA: Diagnosis present

## 2019-07-30 DIAGNOSIS — G9340 Encephalopathy, unspecified: Secondary | ICD-10-CM | POA: Diagnosis not present

## 2019-07-30 DIAGNOSIS — E1122 Type 2 diabetes mellitus with diabetic chronic kidney disease: Secondary | ICD-10-CM | POA: Diagnosis present

## 2019-07-30 DIAGNOSIS — J189 Pneumonia, unspecified organism: Secondary | ICD-10-CM

## 2019-07-30 DIAGNOSIS — A499 Bacterial infection, unspecified: Secondary | ICD-10-CM | POA: Diagnosis not present

## 2019-07-30 DIAGNOSIS — R0902 Hypoxemia: Secondary | ICD-10-CM

## 2019-07-30 DIAGNOSIS — E669 Obesity, unspecified: Secondary | ICD-10-CM | POA: Diagnosis not present

## 2019-07-30 DIAGNOSIS — J9611 Chronic respiratory failure with hypoxia: Secondary | ICD-10-CM | POA: Diagnosis not present

## 2019-07-30 DIAGNOSIS — M7989 Other specified soft tissue disorders: Secondary | ICD-10-CM | POA: Diagnosis not present

## 2019-07-30 DIAGNOSIS — R001 Bradycardia, unspecified: Secondary | ICD-10-CM | POA: Diagnosis not present

## 2019-07-30 DIAGNOSIS — E1169 Type 2 diabetes mellitus with other specified complication: Secondary | ICD-10-CM | POA: Diagnosis not present

## 2019-07-30 DIAGNOSIS — L89304 Pressure ulcer of unspecified buttock, stage 4: Secondary | ICD-10-CM | POA: Diagnosis not present

## 2019-07-30 DIAGNOSIS — E119 Type 2 diabetes mellitus without complications: Secondary | ICD-10-CM | POA: Diagnosis not present

## 2019-07-30 DIAGNOSIS — Z6841 Body Mass Index (BMI) 40.0 and over, adult: Secondary | ICD-10-CM

## 2019-07-30 DIAGNOSIS — J9809 Other diseases of bronchus, not elsewhere classified: Secondary | ICD-10-CM | POA: Diagnosis not present

## 2019-07-30 LAB — CBC WITH DIFFERENTIAL/PLATELET
Abs Immature Granulocytes: 0 10*3/uL (ref 0.00–0.07)
Band Neutrophils: 1 %
Basophils Absolute: 0.1 10*3/uL (ref 0.0–0.1)
Basophils Relative: 1 %
Blasts: 0 %
Eosinophils Absolute: 0.1 10*3/uL (ref 0.0–0.5)
Eosinophils Relative: 1 %
HCT: 47.1 % (ref 39.0–52.0)
Hemoglobin: 14.6 g/dL (ref 13.0–17.0)
Lymphocytes Relative: 43 %
Lymphs Abs: 6.2 10*3/uL — ABNORMAL HIGH (ref 0.7–4.0)
MCH: 28.1 pg (ref 26.0–34.0)
MCHC: 31 g/dL (ref 30.0–36.0)
MCV: 90.6 fL (ref 80.0–100.0)
Metamyelocytes Relative: 0 %
Monocytes Absolute: 0.6 10*3/uL (ref 0.1–1.0)
Monocytes Relative: 4 %
Myelocytes: 0 %
Neutro Abs: 7.4 10*3/uL (ref 1.7–7.7)
Neutrophils Relative %: 50 %
Other: 0 %
Platelets: 295 10*3/uL (ref 150–400)
Promyelocytes Relative: 0 %
RBC: 5.2 MIL/uL (ref 4.22–5.81)
RDW: 15.1 % (ref 11.5–15.5)
WBC: 14.4 10*3/uL — ABNORMAL HIGH (ref 4.0–10.5)
nRBC: 0 /100 WBC
nRBC: 0.3 % — ABNORMAL HIGH (ref 0.0–0.2)

## 2019-07-30 LAB — URINALYSIS, ROUTINE W REFLEX MICROSCOPIC
Bilirubin Urine: NEGATIVE
Glucose, UA: >=500 mg/dL — AB
Ketones, ur: NEGATIVE mg/dL
Leukocytes,Ua: NEGATIVE
Nitrite: NEGATIVE
Protein, ur: 100 mg/dL — AB
RBC / HPF: >50 RBC/hpf — ABNORMAL HIGH (ref 0–5)
Specific Gravity, Urine: 1.026 (ref 1.005–1.030)
pH: 5 (ref 5.0–8.0)

## 2019-07-30 LAB — BETA-HYDROXYBUTYRIC ACID: Beta-Hydroxybutyric Acid: 0.28 mmol/L — ABNORMAL HIGH (ref 0.05–0.27)

## 2019-07-30 LAB — BASIC METABOLIC PANEL
Anion gap: 17 — ABNORMAL HIGH (ref 5–15)
Anion gap: 16 — ABNORMAL HIGH (ref 5–15)
BUN: 24 mg/dL — ABNORMAL HIGH (ref 6–20)
BUN: 29 mg/dL — ABNORMAL HIGH (ref 6–20)
CO2: 25 mmol/L (ref 22–32)
CO2: 25 mmol/L (ref 22–32)
Calcium: 8.6 mg/dL — ABNORMAL LOW (ref 8.9–10.3)
Calcium: 8.5 mg/dL — ABNORMAL LOW (ref 8.9–10.3)
Chloride: 92 mmol/L — ABNORMAL LOW (ref 98–111)
Chloride: 95 mmol/L — ABNORMAL LOW (ref 98–111)
Creatinine, Ser: 1.81 mg/dL — ABNORMAL HIGH (ref 0.61–1.24)
Creatinine, Ser: 2.01 mg/dL — ABNORMAL HIGH (ref 0.61–1.24)
GFR calc Af Amer: 47 mL/min — ABNORMAL LOW (ref >60)
GFR calc Af Amer: 41 mL/min — ABNORMAL LOW (ref >60)
GFR calc non Af Amer: 41 mL/min — ABNORMAL LOW (ref >60)
GFR calc non Af Amer: 36 mL/min — ABNORMAL LOW (ref >60)
Glucose, Bld: 672 mg/dL (ref 70–99)
Glucose, Bld: 666 mg/dL (ref 70–99)
Potassium: 4.5 mmol/L (ref 3.5–5.1)
Potassium: 4 mmol/L (ref 3.5–5.1)
Sodium: 134 mmol/L — ABNORMAL LOW (ref 135–145)
Sodium: 136 mmol/L (ref 135–145)

## 2019-07-30 LAB — I-STAT CHEM 8, ED
BUN: 37 mg/dL — ABNORMAL HIGH (ref 6–20)
Calcium, Ion: 1.14 mmol/L — ABNORMAL LOW (ref 1.15–1.40)
Chloride: 93 mmol/L — ABNORMAL LOW (ref 98–111)
Creatinine, Ser: 1.5 mg/dL — ABNORMAL HIGH (ref 0.61–1.24)
Glucose, Bld: 652 mg/dL (ref 70–99)
HCT: 50 % (ref 39.0–52.0)
Hemoglobin: 17 g/dL (ref 13.0–17.0)
Potassium: 4.8 mmol/L (ref 3.5–5.1)
Sodium: 137 mmol/L (ref 135–145)
TCO2: 32 mmol/L (ref 22–32)

## 2019-07-30 LAB — LIPASE, BLOOD: Lipase: 27 U/L (ref 11–51)

## 2019-07-30 LAB — I-STAT VENOUS BLOOD GAS, ED
Acid-base deficit: 1 mmol/L (ref 0.0–2.0)
Bicarbonate: 28.3 mmol/L — ABNORMAL HIGH (ref 20.0–28.0)
Calcium, Ion: 1.06 mmol/L — ABNORMAL LOW (ref 1.15–1.40)
HCT: 50 % (ref 39.0–52.0)
Hemoglobin: 17 g/dL (ref 13.0–17.0)
O2 Saturation: 85 %
Potassium: 4.8 mmol/L (ref 3.5–5.1)
Sodium: 137 mmol/L (ref 135–145)
TCO2: 30 mmol/L (ref 22–32)
pCO2, Ven: 63.7 mmHg — ABNORMAL HIGH (ref 44.0–60.0)
pH, Ven: 7.255 (ref 7.250–7.430)
pO2, Ven: 59 mmHg — ABNORMAL HIGH (ref 32.0–45.0)

## 2019-07-30 LAB — I-STAT ARTERIAL BLOOD GAS, ED
Acid-Base Excess: 0 mmol/L (ref 0.0–2.0)
Bicarbonate: 30.1 mmol/L — ABNORMAL HIGH (ref 20.0–28.0)
Calcium, Ion: 1.14 mmol/L — ABNORMAL LOW (ref 1.15–1.40)
HCT: 47 % (ref 39.0–52.0)
Hemoglobin: 16 g/dL (ref 13.0–17.0)
O2 Saturation: 63 %
Potassium: 3.6 mmol/L (ref 3.5–5.1)
Sodium: 136 mmol/L (ref 135–145)
TCO2: 32 mmol/L (ref 22–32)
pCO2 arterial: 73.7 mmHg (ref 32.0–48.0)
pH, Arterial: 7.219 — ABNORMAL LOW (ref 7.350–7.450)
pO2, Arterial: 40 mmHg — CL (ref 83.0–108.0)

## 2019-07-30 LAB — CBG MONITORING, ED: Glucose-Capillary: 595 mg/dL (ref 70–99)

## 2019-07-30 LAB — LACTIC ACID, PLASMA
Lactic Acid, Venous: 6 mmol/L (ref 0.5–1.9)
Lactic Acid, Venous: 4.5 mmol/L (ref 0.5–1.9)

## 2019-07-30 LAB — MAGNESIUM: Magnesium: 1.4 mg/dL — ABNORMAL LOW (ref 1.7–2.4)

## 2019-07-30 LAB — MRSA PCR SCREENING: MRSA by PCR: NEGATIVE

## 2019-07-30 LAB — GLUCOSE, CAPILLARY
Glucose-Capillary: 593 mg/dL (ref 70–99)
Glucose-Capillary: 477 mg/dL — ABNORMAL HIGH (ref 70–99)

## 2019-07-30 LAB — BRAIN NATRIURETIC PEPTIDE: B Natriuretic Peptide: 88.2 pg/mL (ref 0.0–100.0)

## 2019-07-30 LAB — SARS CORONAVIRUS 2 BY RT PCR (HOSPITAL ORDER, PERFORMED IN ~~LOC~~ HOSPITAL LAB): SARS Coronavirus 2: NEGATIVE

## 2019-07-30 LAB — PROCALCITONIN: Procalcitonin: 3.64 ng/mL

## 2019-07-30 LAB — D-DIMER, QUANTITATIVE: D-Dimer, Quant: >20 ug/mL-FEU — ABNORMAL HIGH (ref 0.00–0.50)

## 2019-07-30 MED ORDER — SODIUM CHLORIDE 0.9 % IV SOLN: INTRAVENOUS | Status: DC | PRN

## 2019-07-30 MED ORDER — DOCUSATE SODIUM 50 MG/5ML PO LIQD
100.0000 mg | Freq: Two times a day (BID) | ORAL | Status: DC
  Administered 2019-07-31 – 2019-08-11 (×21): 100 mg
  Filled 2019-07-30 (×23): qty 10

## 2019-07-30 MED ORDER — POLYETHYLENE GLYCOL 3350 17 G PO PACK: 17.0000 g | PACK | Freq: Every day | ORAL | Status: DC

## 2019-07-30 MED ORDER — CHLORHEXIDINE GLUCONATE 0.12% ORAL RINSE (MEDLINE KIT)
15.0000 mL | Freq: Two times a day (BID) | OROMUCOSAL | Status: DC
  Administered 2019-07-30 – 2019-09-14 (×82): 15 mL via OROMUCOSAL

## 2019-07-30 MED ORDER — ORAL CARE MOUTH RINSE
15.0000 mL | OROMUCOSAL | Status: DC
  Administered 2019-07-30 – 2019-09-13 (×355): 15 mL via OROMUCOSAL

## 2019-07-30 MED ORDER — SODIUM CHLORIDE 0.9 % IV SOLN
INTRAVENOUS | Status: DC
  Administered 2019-08-11: 10 mL/h via INTRAVENOUS

## 2019-07-30 MED ORDER — FENTANYL CITRATE (PF) 100 MCG/2ML IJ SOLN
100.0000 ug | Freq: Once | INTRAMUSCULAR | Status: AC
  Administered 2019-07-30: 100 ug via INTRAVENOUS
  Filled 2019-07-30: qty 2

## 2019-07-30 MED ORDER — FENTANYL CITRATE (PF) 100 MCG/2ML IJ SOLN: 50.0000 ug | INTRAMUSCULAR | Status: DC | PRN

## 2019-07-30 MED ORDER — DEXTROSE 50 % IV SOLN: 0.0000 mL | INTRAVENOUS | Status: DC | PRN

## 2019-07-30 MED ORDER — FUROSEMIDE 10 MG/ML IJ SOLN
80.0000 mg | Freq: Once | INTRAMUSCULAR | Status: AC
  Administered 2019-07-30: 80 mg via INTRAVENOUS
  Filled 2019-07-30: qty 8

## 2019-07-30 MED ORDER — EPINEPHRINE 1 MG/10ML IJ SOSY: 0.3000 mg | PREFILLED_SYRINGE | Freq: Once | INTRAMUSCULAR | Status: DC

## 2019-07-30 MED ORDER — BUDESONIDE 0.25 MG/2ML IN SUSP
0.2500 mg | Freq: Two times a day (BID) | RESPIRATORY_TRACT | Status: DC
  Administered 2019-07-30 – 2019-08-22 (×47): 0.25 mg via RESPIRATORY_TRACT
  Filled 2019-07-30 (×48): qty 2

## 2019-07-30 MED ORDER — SERTRALINE HCL 50 MG PO TABS
100.0000 mg | ORAL_TABLET | Freq: Every day | ORAL | Status: DC
  Administered 2019-07-30 – 2019-09-05 (×39): 100 mg
  Filled 2019-07-30 (×39): qty 1

## 2019-07-30 MED ORDER — ATORVASTATIN CALCIUM 10 MG PO TABS
10.0000 mg | ORAL_TABLET | Freq: Every day | ORAL | Status: DC
  Administered 2019-07-31 – 2019-09-05 (×38): 10 mg
  Filled 2019-07-30 (×38): qty 1

## 2019-07-30 MED ORDER — PROPOFOL 1000 MG/100ML IV EMUL
0.0000 ug/kg/min | INTRAVENOUS | Status: DC
  Administered 2019-07-30: 20 ug/kg/min via INTRAVENOUS
  Filled 2019-07-30: qty 100

## 2019-07-30 MED ORDER — METOPROLOL TARTRATE 5 MG/5ML IV SOLN
5.0000 mg | INTRAVENOUS | Status: AC
  Administered 2019-07-30: 5 mg via INTRAVENOUS
  Filled 2019-07-30: qty 5

## 2019-07-30 MED ORDER — LACTATED RINGERS IV BOLUS: 1000.0000 mL | INTRAVENOUS | Status: DC

## 2019-07-30 MED ORDER — ROCURONIUM BROMIDE 50 MG/5ML IV SOLN
INTRAVENOUS | Status: AC | PRN
  Administered 2019-07-30: 100 mg via INTRAVENOUS

## 2019-07-30 MED ORDER — HEPARIN BOLUS VIA INFUSION
5000.0000 [IU] | Freq: Once | INTRAVENOUS | Status: AC
  Administered 2019-07-30: 5000 [IU] via INTRAVENOUS
  Filled 2019-07-30: qty 5000

## 2019-07-30 MED ORDER — EPINEPHRINE 1 MG/10ML IJ SOSY
PREFILLED_SYRINGE | INTRAMUSCULAR | Status: AC | PRN
  Administered 2019-07-30: 0.3 mg via INTRAVENOUS

## 2019-07-30 MED ORDER — HEPARIN (PORCINE) 25000 UT/250ML-% IV SOLN
1600.0000 [IU]/h | INTRAVENOUS | Status: DC
  Administered 2019-07-30: 2000 [IU]/h via INTRAVENOUS
  Administered 2019-07-31: 1800 [IU]/h via INTRAVENOUS
  Filled 2019-07-30 (×2): qty 250

## 2019-07-30 MED ORDER — KETAMINE HCL 50 MG/ML IJ SOLN
2.0000 mg/kg | Freq: Once | INTRAMUSCULAR | Status: DC
  Filled 2019-07-30: qty 10

## 2019-07-30 MED ORDER — PANTOPRAZOLE SODIUM 40 MG IV SOLR
40.0000 mg | Freq: Every day | INTRAVENOUS | Status: DC
  Administered 2019-07-30 – 2019-08-05 (×7): 40 mg via INTRAVENOUS
  Filled 2019-07-30 (×8): qty 40

## 2019-07-30 MED ORDER — CARVEDILOL 25 MG PO TABS
25.0000 mg | ORAL_TABLET | Freq: Two times a day (BID) | ORAL | Status: DC
  Administered 2019-07-31: 25 mg
  Filled 2019-07-30: qty 1

## 2019-07-30 MED ORDER — INSULIN REGULAR(HUMAN) IN NACL 100-0.9 UT/100ML-% IV SOLN
INTRAVENOUS | Status: AC
  Administered 2019-07-30: 13 [IU]/h via INTRAVENOUS
  Administered 2019-07-31: 21 [IU]/h via INTRAVENOUS
  Administered 2019-07-31: 8.5 [IU]/h via INTRAVENOUS
  Filled 2019-07-30 (×4): qty 100

## 2019-07-30 MED ORDER — SODIUM BICARBONATE 8.4 % IV SOLN
INTRAVENOUS | Status: AC | PRN
  Administered 2019-07-30: 100 meq via INTRAVENOUS

## 2019-07-30 MED ORDER — IPRATROPIUM-ALBUTEROL 0.5-2.5 (3) MG/3ML IN SOLN
3.0000 mL | RESPIRATORY_TRACT | Status: DC
  Administered 2019-07-30 – 2019-08-09 (×61): 3 mL via RESPIRATORY_TRACT
  Filled 2019-07-30 (×60): qty 3

## 2019-07-30 MED ORDER — FUROSEMIDE 10 MG/ML IJ SOLN
80.0000 mg | Freq: Two times a day (BID) | INTRAMUSCULAR | Status: DC
  Administered 2019-07-30 – 2019-07-31 (×2): 80 mg via INTRAVENOUS
  Filled 2019-07-30 (×2): qty 8

## 2019-07-30 MED ORDER — METOPROLOL TARTRATE 5 MG/5ML IV SOLN: 5.0000 mg | INTRAVENOUS | Status: DC | PRN

## 2019-07-30 MED ORDER — METOLAZONE 5 MG PO TABS
2.5000 mg | ORAL_TABLET | Freq: Every day | ORAL | Status: DC
  Administered 2019-07-30 – 2019-08-05 (×7): 2.5 mg
  Filled 2019-07-30 (×7): qty 1

## 2019-07-30 MED ORDER — PROPOFOL 1000 MG/100ML IV EMUL
INTRAVENOUS | Status: AC
  Filled 2019-07-30: qty 100

## 2019-07-30 MED ORDER — KETAMINE HCL 10 MG/ML IJ SOLN
INTRAMUSCULAR | Status: AC | PRN
  Administered 2019-07-30: 150 mg via INTRAVENOUS

## 2019-07-30 MED ORDER — HYDRALAZINE HCL 20 MG/ML IJ SOLN
10.0000 mg | INTRAMUSCULAR | Status: DC | PRN
  Administered 2019-08-16: 20 mg via INTRAVENOUS
  Filled 2019-07-30: qty 1

## 2019-07-30 MED ORDER — FENTANYL CITRATE (PF) 100 MCG/2ML IJ SOLN
50.0000 ug | INTRAMUSCULAR | Status: DC | PRN
  Administered 2019-07-30 – 2019-08-01 (×7): 100 ug via INTRAVENOUS
  Administered 2019-08-01 (×2): 50 ug via INTRAVENOUS
  Filled 2019-07-30 (×9): qty 2

## 2019-07-30 MED ORDER — KETAMINE HCL 50 MG/5ML IJ SOSY: 150.0000 mg | PREFILLED_SYRINGE | Freq: Once | INTRAMUSCULAR | Status: DC

## 2019-07-30 MED ORDER — DOCUSATE SODIUM 50 MG/5ML PO LIQD
100.0000 mg | Freq: Two times a day (BID) | ORAL | Status: DC
  Filled 2019-07-30: qty 10

## 2019-07-30 MED ORDER — PROPOFOL 1000 MG/100ML IV EMUL
0.0000 ug/kg/min | INTRAVENOUS | Status: DC
  Administered 2019-07-30: 20 ug/kg/min via INTRAVENOUS
  Administered 2019-07-31: 10 ug/kg/min via INTRAVENOUS
  Administered 2019-07-31 – 2019-08-01 (×3): 20 ug/kg/min via INTRAVENOUS
  Administered 2019-08-01: 10 ug/kg/min via INTRAVENOUS
  Administered 2019-08-01: 15 ug/kg/min via INTRAVENOUS
  Administered 2019-08-02 (×2): 25 ug/kg/min via INTRAVENOUS
  Administered 2019-08-02: 20 ug/kg/min via INTRAVENOUS
  Administered 2019-08-02: 25 ug/kg/min via INTRAVENOUS
  Administered 2019-08-03 (×2): 15 ug/kg/min via INTRAVENOUS
  Administered 2019-08-03: 20 ug/kg/min via INTRAVENOUS
  Administered 2019-08-03 – 2019-08-04 (×2): 15 ug/kg/min via INTRAVENOUS
  Administered 2019-08-04: 40 ug/kg/min via INTRAVENOUS
  Administered 2019-08-04: 20 ug/kg/min via INTRAVENOUS
  Administered 2019-08-04: 50 ug/kg/min via INTRAVENOUS
  Administered 2019-08-04 – 2019-08-05 (×2): 30 ug/kg/min via INTRAVENOUS
  Administered 2019-08-05: 20 ug/kg/min via INTRAVENOUS
  Administered 2019-08-05: 25 ug/kg/min via INTRAVENOUS
  Administered 2019-08-05 (×2): 30 ug/kg/min via INTRAVENOUS
  Administered 2019-08-05 – 2019-08-06 (×6): 20 ug/kg/min via INTRAVENOUS
  Administered 2019-08-07: 12 ug/kg/min via INTRAVENOUS
  Administered 2019-08-07: 15 ug/kg/min via INTRAVENOUS
  Administered 2019-08-08: 12 ug/kg/min via INTRAVENOUS
  Filled 2019-07-30 (×35): qty 100

## 2019-07-30 MED ORDER — POLYETHYLENE GLYCOL 3350 17 G PO PACK
17.0000 g | PACK | Freq: Every day | ORAL | Status: DC
  Administered 2019-07-31 – 2019-08-11 (×10): 17 g
  Filled 2019-07-30 (×11): qty 1

## 2019-07-30 MED FILL — Medication: Qty: 1 | Status: AC

## 2019-07-30 NOTE — Progress Notes (Signed)
ANTICOAGULATION CONSULT NOTE - Initial Consult  Pharmacy Consult for heparin Indication: R/o PE   No Known Allergies  Patient Measurements: Height: 6' (182.9 cm) Weight: 100 kg (220 lb 7.4 oz) IBW/kg (Calculated) : 77.6 Heparin Dosing Weight: 152 kg (based on previous weights)   Vital Signs: Temp: 98.6 F (37 C) (07/11 1857) Temp Source: Other (Comment) (07/11 1739) BP: 95/57 (07/11 1857) Pulse Rate: 114 (07/11 1857)  Labs: Recent Labs    07/30/19 1718 07/30/19 1718 07/30/19 1724 07/30/19 1741  HGB 14.6   17.0   < > 17.0 16.0  HCT 47.1   50.0  --  50.0 47.0  PLT 295  --   --   --   CREATININE 1.81*   1.50*  --   --   --    < > = values in this interval not displayed.    Estimated Creatinine Clearance: 55.2 mL/min (A) (by C-G formula based on SCr of 1.81 mg/dL (H)).   Medical History: Past Medical History:  Diagnosis Date   Asthma    Chronic respiratory failure with hypoxia ([place]) 07/09/2017   COPD (chronic obstructive pulmonary disease) ([facility])    Diabetes mellitus without complication ([facility])    Difficult intubation    Hypertension    Shortness of breath dyspnea    Sleep apnea     Medications:  (Not in a hospital admission)   Assessment: 49 YOM who presents with hypoxic/hypercarbic respiratory failure requiring intubation. Pharmacy consulted to start IV heparin due to some concern for PE. H/H and Plt wnl   Goal of Therapy:  Heparin level 0.3-0.7 units/ml Monitor platelets by anticoagulation protocol: Yes   Plan:  -Heparin 5000 units IV bolus then start IV heparin at 2000 units/hr -F/u 8 hr HL -Monitor daily HL, CBC and s/s of bleeding   Albertina Parr, PharmD., BCPS, BCCCP Clinical Pharmacist Clinical phone for 07/30/19 until 10pm: [phone] If after 10pm, please refer to [facility] for unit-specific pharmacist

## 2019-07-30 NOTE — Progress Notes (Signed)
RT note: RT paced Rt radial aline per protocol. Good return an no adverse effects.

## 2019-07-30 NOTE — ED Triage Notes (Addendum)
Patient arrived by [facility] and after taking out trash became SOB and speaking 1-2 word sentences, attempted his own neb treatment. Fire found with minimal resp. effort, bagged by fire. Lung sounds absent/ diminished. Hx of asthma. Received 10 of albuterol prior to arival 1mg  atrovent   PTA   spo2 on transport-60s, HR 130S  NSR

## 2019-07-30 NOTE — Procedures (Signed)
Central Venous Catheter Insertion Procedure Note  MALON BRANTON  [id number]  May 07, 1960  Date:07/30/19  Time:7:09 PM   Provider Performing:Pete Johnette Abraham Kary Kos   Procedure: Insertion of Non-tunneled Central Venous [phone]) with US guidance ([id number])   Indication(s) Medication administration and Difficult access  Consent Unable to obtain consent due to inability to find a medical decision maker for patient.  All reasonable efforts were made.  Another independent medical provider, I  , confirmed the benefits of this procedure outweigh the risks.  Anesthesia Topical only with 1% lidocaine   Timeout Verified patient identification, verified procedure, site/side was marked, verified correct patient position, special equipment/implants available, medications/allergies/relevant history reviewed, required imaging and test results available.  Sterile Technique Maximal sterile technique including full sterile barrier drape, hand hygiene, sterile gown, sterile gloves, mask, hair covering, sterile ultrasound probe cover (if used).  Procedure Description Area of catheter insertion was cleaned with chlorhexidine and draped in sterile fashion.  With real-time ultrasound guidance a central venous catheter was placed into the right internal jugular vein. Nonpulsatile blood flow and easy flushing noted in all ports.  The catheter was sutured in place and sterile dressing applied.  Complications/Tolerance None; patient tolerated the procedure well. Chest X-ray is ordered to verify placement for internal jugular or subclavian cannulation.   Chest x-ray is not ordered for femoral cannulation.  EBL Minimal  Specimen(s) None  Erick Colace ACNP-BC [place] Pager # [phone] OR # [phone] if no answer

## 2019-07-30 NOTE — H&P (Signed)
NAME:  Timothy Le, MRN:  [id number], DOB:  [birth year], LOS: 0 ADMISSION DATE:  07/30/2019, CONSULTATION DATE:  7/11 REFERRING MD:  Mariane Masters, CHIEF COMPLAINT:  Acute respiratory failure    Brief History   59 year old black male with baseline chronic respiratory failure on the basis of severe OHS/OSA complicated further by significant obstructive disease in the setting of COPD.  Presents with acute on chronic hypoxic and hypercarbic respiratory failure.  Admitted with the initial working diagnosis of pulmonary edema and decompensated diastolic heart failure  History of present illness   59 year old male patient known to pulmonary division followed for chronic respiratory failure on the basis of severe COPD, as well as significant restrictive disease by obesity hypoventilation and obstructive sleep apnea.  He is managed on BiPAP, after successfully having significant weight loss which allowed him to have tracheostomy removed following successful titration study.  He has been followed by Dr. Annamaria Boots in the outpatient setting last seen in May.  And had been doing well followed by a nutritionist and walking daily. Presents to the emergency room via EMS today on 7/11.  Apparently had been taking garbage out, on return to the house was acutely short of breath with significant wheezing.  He tried bronchodilator, also placed himself on his home BiPAP device without improvement EMS was therefore called fire arrived first finding him minimally responsive he was manually assisted ventilation with bag valve mask, given albuterol nebulizer and transferred to the emergency room.  He was unresponsive on presentation and was intubated for airway protection, marked hypoxia, and presumptive hypercarbia.  After airway was stabilized The following initial diagnostic data was identified: White blood cell count 14.4, initial venous blood gas PCO2 64, bicarbonate 28, pH 7.25, follow-up intubation arterial blood gas pH 7.22,  PCO2 74, PO2 40 bicarbonate 30, pulse oximetry 63%.  Portable chest x-ray with diffuse pulmonary edema endotracheal tube in satisfactory position.  On i-STAT chemistry creatinine 1.5 up from baseline of 1.35-1.43 glucose 652 Critical care was asked to admit   Past Medical History  History of OHS, OSA, diastolic heart failure, chronic respiratory failure on this basis.  Also has a history of prior tracheostomy this was removed after significant weight loss and successful BiPAP titration study.  Morbid obesity, diabetes type 2, anemia, COPD with significant obstructive disease and slight response to bronchodilators FEV1 1.77/59% predicted prior smoker Significant Hospital Events   7/11 and admitted: Intubated.  Requiring high PEEP for refractory hypoxia, central line placed, IV diuresis administered   Consults:    Procedures:  Endotracheal tube 7/11 Right IJ triple-lumen catheter 7/11  Significant Diagnostic Tests:  Echocardiogram 7/11 Lower extremity ultrasound 7/11 Urine drug screen 7/11 Micro Data:  Sputum culture 7/11 Blood culture 7/11  Antimicrobials:    Interim history/subjective:  Minimally responsive on propofol infusion  Objective   Blood pressure 130/81, pulse (Abnormal) 148, temperature (Abnormal) 97.2 F (36.2 C), temperature source Other (Comment), resp. rate (Abnormal) 27, height 6' (1.829 m), weight 100 kg, SpO2 (Abnormal) 81 %.    Vent Mode: PRVC FiO2 (%):  [100 %] 100 % Set Rate:  [30 bmp] 30 bmp Vt Set:  [400 mL] 400 mL PEEP:  [16 cmH20] 16 cmH20 Plateau Pressure:  [31 cmH20] 31 cmH20  No intake or output data in the 24 hours ending 07/30/19 1831 Filed Weights   07/30/19 1600  Weight: 100 kg    Examination: General: Obese 59 year old black male currently sedated on propofol HEENT normocephalic atraumatic neck is large no  clear JVD mucous membranes moist pupils reactive old healed tracheostomy site midline Lungs: Diffuse lung sounds throughout  diminished in the bases no accessory use currently FiO2 100/PEEP 16.  Saturations mid 80s currently had been as low as 83% Cardiovascular: Distal regular rate and rhythm Abdomen: Obese positive bowel sounds Extremities: Lower extremity edema present pulses palpable Neuro: Sedated GU: Due to void Foley catheter placed  Resolved Hospital Problem list     Assessment & Plan:  Acute on chronic hypoxic and hypercarbic respiratory failure in the setting of what appears to be pulmonary edema likely due to acute diastolic heart failure, all complicated by underlying severe OHS and OSA as well as  COPD -He has been fairly compliant and motivated from a pulmonary standpoint, certainly his underlying lung disease and body habitus contributing  -Given the acuteness of the situation I also wonder about acute pulmonary embolus however x-ray evaluation shows diffuse pulmonary edema likely accounting for his degree of hypoxia Plan Full ventilatory support PEEP titrated for saturation goal greater than 88 Minute ventilation maximized PAD protocol with RASS goal -2 IV diuresis Scheduled bronchodilators He is not wheezing, I think we can hold off on steroids especially with hyperglycemia Blood pressure control Sputum culture VAP bundle Check echocardiogram assessing for RV strain and new LV dysfunction Lower extremity Dopplers I will place him on IV heparin for now, will discontinue this if ultrasound is negative and no evidence of RV strain  Acute diastolic heart failure with pulmonary edema.He has known history of HFpEF with last recorded EF 55 to 60% he also has history of prior RV dilation Plan IV Lasix IV beta-blockade for tachycardia as this will exacerbate diastolic dysfunction Repeat echocardiogram as mentioned above Continue telemetry monitoring  Severe lactic acidosis .  This is not sepsis.  Almost certainly a consequence of his increase respiratory failure Plan Supportive care follow-up  chemistries and lactate  Acute metabolic encephalopathy secondary to hypercarbia and hypoxia Plan Supportive care PAD protocol Continue his home Zoloft  CKD stage II/III Baseline creatinine 1.4 range currently 1.5 Plan IV Lasix Renal dose medications Blood pressure control Foley catheter Strict intake output Serial chemistries  Diabetes with severe hyperglycemia Plan IV insulin protocol Check Beta hydroxybutyric acid  Mild leukocytosis  Plan Trend cbc   Best practice:  Diet: NPO Pain/Anxiety/Delirium protocol (if indicated): ordered VAP protocol (if indicated): oredere DVT prophylaxis: IV heparin  GI prophylaxis: PPI Glucose control: insulin gtt Mobility: BR Code Status: full code  Family Communication: pending  Disposition:  ICU admit  Labs   CBC: Recent Labs  Lab 07/30/19 1718 07/30/19 1724 07/30/19 1741  WBC 14.4*  --   --   NEUTROABS 7.4  --   --   HGB 14.6  17.0 17.0 16.0  HCT 47.1  50.0 50.0 47.0  MCV 90.6  --   --   PLT 295  --   --     Basic Metabolic Panel: Recent Labs  Lab 07/30/19 1718 07/30/19 1724 07/30/19 1741  NA 137 137 136  K 4.8 4.8 3.6  CL 93*  --   --   GLUCOSE 652*  --   --   BUN 37*  --   --   CREATININE 1.50*  --   --    GFR: Estimated Creatinine Clearance: 66.6 mL/min (A) (by C-G formula based on SCr of 1.5 mg/dL (H)). Recent Labs  Lab 07/30/19 1718  WBC 14.4*  LATICACIDVEN 6.0*    Liver Function Tests: No results for  input(s): AST, ALT, ALKPHOS, BILITOT, PROT, ALBUMIN in the last 168 hours. No results for input(s): LIPASE, AMYLASE in the last 168 hours. No results for input(s): AMMONIA in the last 168 hours.  ABG    Component Value Date/Time   PHART 7.219 (L) 07/30/2019 1741   PCO2ART 73.7 (HH) 07/30/2019 1741   PO2ART 40 (LL) 07/30/2019 1741   HCO3 30.1 (H) 07/30/2019 1741   TCO2 32 07/30/2019 1741   ACIDBASEDEF 1.0 07/30/2019 1724   O2SAT 63.0 07/30/2019 1741     Coagulation Profile: No results  for input(s): INR, PROTIME in the last 168 hours.  Cardiac Enzymes: No results for input(s): CKTOTAL, CKMB, CKMBINDEX, TROPONINI in the last 168 hours.  HbA1C: Hgb A1c MFr Bld  Date/Time Value Ref Range Status  03/04/2019 07:08 AM 16.6 (H) 4.8 - 5.6 % Final    Comment:    (NOTE) Pre diabetes:          5.7%-6.4% Diabetes:              >6.4% Glycemic control for   <7.0% adults with diabetes   03/03/2019 01:55 PM >15.5 (H) 4.8 - 5.6 % Final    Comment:    (NOTE) **Verified by repeat analysis**         Prediabetes: 5.7 - 6.4         Diabetes: >6.4         Glycemic control for adults with diabetes: <7.0     CBG: No results for input(s): GLUCAP in the last 168 hours.  Review of Systems:   Not able   Past Medical History  He,  has a past medical history of Asthma, Chronic respiratory failure with hypoxia ([place]) (07/09/2017), COPD (chronic obstructive pulmonary disease) ([place]), Diabetes mellitus without complication ([place]), Difficult intubation, Hypertension, Shortness of breath dyspnea, and Sleep apnea.   Surgical History    Past Surgical History:  Procedure Laterality Date  . IR GASTROSTOMY TUBE MOD SED  07/05/2017  . TRACHEOSTOMY TUBE PLACEMENT N/A 06/17/2017   Procedure: TRACHEOSTOMY;  Surgeon: Leta Baptist, MD;  Location: [facility] OR;  Service: ENT;  Laterality: N/A;     Social History   reports that he quit smoking about 6 years ago. His smoking use included cigarettes. He started smoking about 36 years ago. He has a 0.25 pack-year smoking history. He has never used smokeless tobacco. He reports that he does not drink alcohol and does not use drugs.   Family History   His family history includes Asthma in his sister and another family member; Diabetes type II in his sister; Hypertension in his sister.   Allergies No Known Allergies   Home Medications  Prior to Admission medications   Medication Sig Start Date End Date Taking? Authorizing Provider  allopurinol (ZYLOPRIM) 100 MG  tablet Take 100 mg by mouth daily.    [provider]  atorvastatin (LIPITOR) 10 MG tablet Take 10 mg by mouth daily.    [provider]  budesonide-formoterol (SYMBICORT) 160-4.5 MCG/ACT inhaler Inhale 2 puffs then rinse mouth, twice daiy Patient taking differently: Inhale 1 puff into the lungs 2 (two) times daily. Rinse mouth after use 01/24/19   Young, Tarri Fuller D, MD  desonide (DESOWEN) 0.05 % lotion Apply 1 application topically daily. Apply to face    [provider]  insulin aspart (NOVOLOG FLEXPEN) 100 UNIT/ML FlexPen Inject 8 Units into the skin 3 (three) times daily with meals. 03/06/19   Marianna Payment, MD  insulin glargine (LANTUS) 100 unit/mL  SOPN Inject 0.35 mLs (35 Units total) into the skin at bedtime. 03/06/19 04/05/19  Marianna Payment, MD  Insulin Pen Needle (BD ULTRA-FINE PEN NEEDLES) 29G X 12.7MM MISC USE AS DIRECTED WITH LANTUS 12/23/17   [provider]  ketoconazole (NIZORAL) 2 % cream Apply 1 application topically daily. Apply to wounds    [provider]  metFORMIN (GLUCOPHAGE) 1000 MG tablet Take 1 tablet (1,000 mg total) by mouth 2 (two) times daily with a meal. 03/06/19 03/05/20  Chundi, Verne Spurr, MD  metolazone (ZAROXOLYN) 2.5 MG tablet Take 2.5 mg by mouth daily.    [provider]  potassium chloride SA (K-DUR,KLOR-CON) 20 MEQ tablet Take 1 tablet (20 mEq total) by mouth daily. 07/29/17   Angiulli, Lavon Paganini, PA-C  sertraline (ZOLOFT) 100 MG tablet Take 100 mg by mouth daily.     [provider]  torsemide (DEMADEX) 5 MG tablet Take 5 mg by mouth daily. 01/17/19   [provider]     Critical care time: 21 min      Erick Colace ACNP-BC [place] Pager # [phone] OR # [phone] if no answer

## 2019-07-30 NOTE — ED Provider Notes (Signed)
[facility] EMERGENCY DEPARTMENT Provider Note   CSN: [id number] Arrival date & time: 07/30/19  1652     History No chief complaint on file.   Timothy Le is a 59 y.o. male.  HPI    59 year old male comes in a chief complaint of unresponsiveness.  Level 5 caveat for unresponsiveness.  Patient has history of COPD, diabetes, elevated BMI and OSA.  According to EMS, patient had just returned after taking trash out and became acutely short of breath.  Patient was unable to complete sentences in its entirety and nebulizer treatment have not helped him -therefore he called EMS.  When fire arrived patient was having poor respiratory effort and he was tiring out.  They brought him to the ER well bagging him.  Patient did receive 10 mg of albuterol and 1 mg of ipratropium prior to ED arrival.  O2 sats were in the 60s and 70s.  Past Medical History:  Diagnosis Date  . Asthma   . Chronic respiratory failure with hypoxia ([place]) 07/09/2017  . COPD (chronic obstructive pulmonary disease) ([place])   . Diabetes mellitus without complication ([place])   . Difficult intubation   . Hypertension   . Shortness of breath dyspnea   . Sleep apnea     Patient Active Problem List   Diagnosis Date Noted  . Nodule of soft tissue 01/24/2019  . Benign essential HTN   . Hyperlipidemia   . Debility 07/15/2017  . Chronic respiratory failure with hypoxia ([place]) 07/09/2017  . Difficult airway for intubation   . Obesity hypoventilation syndrome ([place]) 06/09/2017  . Seasonal and perennial allergic rhinitis 05/24/2016  . AKI (acute kidney injury) ([place]) 04/08/2015  . Hyperosmolar hyperglycemic state (HHS) ([place]) 03/28/2015  . Type 2 diabetes mellitus with hyperosmolar nonketotic hyperglycemia ([place]) 03/28/2015  . COPD mixed type ([place]) 12/27/2014  . Severe obesity (BMI >= 40) ([place]) 12/27/2014  . Diabetes mellitus type 2, controlled ([place]) 12/27/2014    Class: Chronic  . OSA (obstructive sleep  apnea) 07/29/2013  . Peripheral edema 07/29/2013    Past Surgical History:  Procedure Laterality Date  . IR GASTROSTOMY TUBE MOD SED  07/05/2017  . TRACHEOSTOMY TUBE PLACEMENT N/A 06/17/2017   Procedure: TRACHEOSTOMY;  Surgeon: Leta Baptist, MD;  Location: [facility] OR;  Service: ENT;  Laterality: N/A;       Family History  Problem Relation Age of Onset  . Asthma Sister   . Asthma Other        nephew  . Hypertension Sister   . Diabetes type II Sister     Social History   Tobacco Use  . Smoking status: Former Smoker    Packs/day: 0.50    Years: 0.50    Pack years: 0.25    Types: Cigarettes    Start date: 07/28/1983    Quit date: 02/27/2013    Years since quitting: 6.4  . Smokeless tobacco: Never Used  Vaping Use  . Vaping Use: Never used  Substance Use Topics  . Alcohol use: No    Comment: quit ETOH about 5 months ago-beer and liquor  . Drug use: No    Comment: quit 6 months-"pot"-smoked couple joints a week    Home Medications Prior to Admission medications   Medication Sig Start Date End Date Taking? Authorizing Provider  allopurinol (ZYLOPRIM) 100 MG tablet Take 100 mg by mouth daily.    [provider]  atorvastatin (LIPITOR) 10 MG tablet Take 10 mg by mouth daily.  [provider]  budesonide-formoterol (SYMBICORT) 160-4.5 MCG/ACT inhaler Inhale 2 puffs then rinse mouth, twice daiy Patient taking differently: Inhale 1 puff into the lungs 2 (two) times daily. Rinse mouth after use 01/24/19   Young, Tarri Fuller D, MD  desonide (DESOWEN) 0.05 % lotion Apply 1 application topically daily. Apply to face    [provider]  insulin aspart (NOVOLOG FLEXPEN) 100 UNIT/ML FlexPen Inject 8 Units into the skin 3 (three) times daily with meals. 03/06/19   Marianna Payment, MD  insulin glargine (LANTUS) 100 unit/mL SOPN Inject 0.35 mLs (35 Units total) into the skin at bedtime. 03/06/19 04/05/19  Marianna Payment, MD  Insulin Pen Needle (BD ULTRA-FINE PEN NEEDLES) 29G X 12.7MM  MISC USE AS DIRECTED WITH LANTUS 12/23/17   [provider]  ketoconazole (NIZORAL) 2 % cream Apply 1 application topically daily. Apply to wounds    [provider]  metFORMIN (GLUCOPHAGE) 1000 MG tablet Take 1 tablet (1,000 mg total) by mouth 2 (two) times daily with a meal. 03/06/19 03/05/20  Chundi, Verne Spurr, MD  metolazone (ZAROXOLYN) 2.5 MG tablet Take 2.5 mg by mouth daily.    [provider]  potassium chloride SA (K-DUR,KLOR-CON) 20 MEQ tablet Take 1 tablet (20 mEq total) by mouth daily. 07/29/17   Angiulli, Lavon Paganini, PA-C  sertraline (ZOLOFT) 100 MG tablet Take 100 mg by mouth daily.     [provider]  torsemide (DEMADEX) 5 MG tablet Take 5 mg by mouth daily. 01/17/19   [provider]    Allergies    Patient has no known allergies.  Review of Systems   Review of Systems  Unable to perform ROS: Patient unresponsive    Physical Exam Updated Vital Signs BP 130/81 (BP Location: Right Arm)   Pulse (!) 148   Temp (!) 97.2 F (36.2 C) (Other (Comment)) Comment (Src): foley temp  Resp (!) 27   Ht 6' (1.829 m)   Wt 100 kg   SpO2 (!) 81%   BMI 29.90 kg/m   Physical Exam Vitals and nursing note reviewed.  Constitutional:      Appearance: He is well-developed. He is diaphoretic.     Comments: Unresponsive  HENT:     Head: Atraumatic.  Cardiovascular:     Rate and Rhythm: Tachycardia present.  Pulmonary:     Effort: Respiratory distress present.     Breath sounds: No wheezing or rales.     Comments: Poor respiratory effort with minimal air movement, exam is also limited because of body habitus Musculoskeletal:     Cervical back: Neck supple.  Skin:    General: Skin is warm.  Neurological:     Comments: GCS 3     ED Results / Procedures / Treatments   Labs (all labs ordered are listed, but only abnormal results are displayed) Labs Reviewed  CBC WITH DIFFERENTIAL/PLATELET - Abnormal; Notable for the following components:       Result Value   WBC 14.4 (*)    nRBC 0.3 (*)    All other components within normal limits  I-STAT VENOUS BLOOD GAS, ED - Abnormal; Notable for the following components:   pCO2, Ven 63.7 (*)    pO2, Ven 59.0 (*)    Bicarbonate 28.3 (*)    Calcium, Ion 1.06 (*)    All other components within normal limits  I-STAT ARTERIAL BLOOD GAS, ED - Abnormal; Notable for the following components:   pH, Arterial 7.219 (*)    pCO2 arterial 73.7 (*)  pO2, Arterial 40 (*)    Bicarbonate 30.1 (*)    Calcium, Ion 1.14 (*)    All other components within normal limits  I-STAT CHEM 8, ED - Abnormal; Notable for the following components:   Chloride 93 (*)    BUN 37 (*)    Creatinine, Ser 1.50 (*)    Glucose, Bld 652 (*)    Calcium, Ion 1.14 (*)    All other components within normal limits  SARS CORONAVIRUS 2 BY RT PCR (HOSPITAL ORDER, [facility])  BASIC METABOLIC PANEL  BASIC METABOLIC PANEL  BASIC METABOLIC PANEL  BASIC METABOLIC PANEL  BETA-HYDROXYBUTYRIC ACID  BETA-HYDROXYBUTYRIC ACID  URINALYSIS, ROUTINE W REFLEX MICROSCOPIC  BRAIN NATRIURETIC PEPTIDE  LACTIC ACID, PLASMA  LACTIC ACID, PLASMA  CBG MONITORING, ED    EKG EKG Interpretation  Date/Time:  Sunday July 30 2019 17:00:59 EDT Ventricular Rate:  134 PR Interval:    QRS Duration: 98 QT Interval:  312 QTC Calculation: 466 R Axis:   -18 Text Interpretation: Sinus tachycardia Multiple premature complexes, vent & supraven Borderline left axis deviation Anterior infarct, old No acute changes No significant change since last tracing Confirmed by Varney Biles [phone]) on 07/30/2019 6:13:19 PM   Radiology DG Chest Portable 1 View  Result Date: 07/30/2019 CLINICAL DATA:  Intubated EXAM: PORTABLE CHEST 1 VIEW COMPARISON:  03/03/2019 FINDINGS: Single frontal view of the chest demonstrates endotracheal tube overlying tracheal air column, tip midway between thoracic inlet and carina. Enteric catheter passes  below diaphragm tip excluded by collimation. The cardiac silhouette is stable. There is widespread bilateral airspace disease, with small bilateral pleural effusions. No pneumothorax. IMPRESSION: 1. Widespread bilateral airspace disease and small pleural effusions, most consistent with pulmonary edema. Superimposed infection would be difficult to exclude. 2. Support devices as above. Electronically Signed   By: Randa Ngo M.D.   On: 07/30/2019 17:23    Procedures .Critical Care Performed by: Varney Biles, MD Authorized by: Varney Biles, MD   Critical care provider statement:    Critical care time (minutes):  48   Critical care was necessary to treat or prevent imminent or life-threatening deterioration of the following conditions:  Respiratory failure   Critical care was time spent personally by me on the following activities:  Discussions with consultants, evaluation of patient's response to treatment, examination of patient, ordering and performing treatments and interventions, ordering and review of laboratory studies, ordering and review of radiographic studies, pulse oximetry, re-evaluation of patient's condition, obtaining history from patient or surrogate and review of old charts Procedure Name: Intubation Date/Time: 07/30/2019 7:24 PM Performed by: Varney Biles, MD Pre-anesthesia Checklist: Patient identified, Patient being monitored, Emergency Drugs available, Timeout performed and Suction available Oxygen Delivery Method: Ambu bag Ventilation: Mask ventilation with difficulty and Two handed mask ventilation required Laryngoscope Size: Glidescope and 3 Grade View: Grade II Tube size: 7.5 mm Number of attempts: 1 Placement Confirmation: ETT inserted through vocal cords under direct vision,  CO2 detector,  Breath sounds checked- equal and bilateral and Positive ETCO2 Secured at: 25 cm Difficulty Due To: Difficulty was anticipated, Difficult Airway- due to reduced neck  mobility and Difficult Airway- due to limited oral opening Future Recommendations: Recommend- induction with short-acting agent, and alternative techniques readily available and Recommend- awake intubation      (including critical care time)  Medications Ordered in ED Medications  ketamine 50 mg in normal saline 5 mL (10 mg/mL) syringe (has no administration in time range)  EPINEPHrine (  ADRENALIN) 1 MG/10ML injection 0.3 mg (has no administration in time range)  0.9 %  sodium chloride infusion (has no administration in time range)  fentaNYL (SUBLIMAZE) injection 100 mcg (has no administration in time range)  sodium bicarbonate injection (100 mEq Intravenous Given 07/30/19 1656)  EPINEPHrine (ADRENALIN) 1 MG/10ML injection (0.3 mg Intravenous Given 07/30/19 1658)  ketamine (KETALAR) injection (150 mg Intravenous Given 07/30/19 1703)  rocuronium (ZEMURON) injection (100 mg Intravenous Given 07/30/19 1704)  propofol (DIPRIVAN) 1000 MG/100ML infusion (  Rate/Dose Change 07/30/19 1803)  metoprolol tartrate (LOPRESSOR) injection 5 mg (5 mg Intravenous Given 07/30/19 1810)  furosemide (LASIX) injection 80 mg (80 mg Intravenous Given 07/30/19 1809)    ED Course  I have reviewed the triage vital signs and the nursing notes.  Pertinent labs & imaging results that were available during my care of the patient were reviewed by me and considered in my medical decision making (see chart for details).    MDM Rules/Calculators/A&P                          59 year old comes in unresponsive after calling EMS for shortness of breath.  Patient has history of CHF, COPD and chronic respiratory failure, diabetes.  Patient had to be bagged in route to the ER.  He arrives with no IV access.  His GCS is 3.  Intraosseous line was placed whilst in the ED. Attempt at awake intubation was considered given patient was minimally responsive and was successful on first attempt.  Clinical impression is that patient  likely has severe asthma exacerbation versus pulmonary edema.  Patient's blood glucose came back elevated therefore DKA also considered in the differential.  Chest x-ray shows interstitial edema.  Given the sudden onset change, likely patient went into pulmonary edema.  Art line placed by RT.  BP at this time is in the 140s.  We will consult critical care for optimal vent management and hemodynamic management.  Final Clinical Impression(s) / ED Diagnoses Final diagnoses:  Acute respiratory failure with hypoxia and hypercapnia ([place])  Acute pulmonary edema [facility])    Rx / DC Orders ED Discharge Orders    None       Varney Biles, MD 07/30/19 1928

## 2019-07-30 NOTE — Progress Notes (Signed)
Pharmacy communication note:  Ketamine was ordered for intubation. Patient did not have IV access so IM Ketamine 50 mg/mL pulled to administered intramuscularly. As drug was being drawn up, IV access was established and Ketamine 150 mg (50 mg/mL) was give IV. 350 mg (7 mL) of Ketamine was wasted in stericycle and documented in pxyis.   Albertina Parr, PharmD., BCPS, BCCCP Clinical Pharmacist

## 2019-07-31 ENCOUNTER — Inpatient Hospital Stay (HOSPITAL_COMMUNITY): Payer: Medicaid Other

## 2019-07-31 DIAGNOSIS — J9600 Acute respiratory failure, unspecified whether with hypoxia or hypercapnia: Secondary | ICD-10-CM

## 2019-07-31 DIAGNOSIS — J9601 Acute respiratory failure with hypoxia: Secondary | ICD-10-CM

## 2019-07-31 DIAGNOSIS — M7989 Other specified soft tissue disorders: Secondary | ICD-10-CM

## 2019-07-31 DIAGNOSIS — J9602 Acute respiratory failure with hypercapnia: Secondary | ICD-10-CM

## 2019-07-31 LAB — BASIC METABOLIC PANEL
Anion gap: 12 (ref 5–15)
Anion gap: 13 (ref 5–15)
Anion gap: 11 (ref 5–15)
BUN: 32 mg/dL — ABNORMAL HIGH (ref 6–20)
BUN: 33 mg/dL — ABNORMAL HIGH (ref 6–20)
BUN: 36 mg/dL — ABNORMAL HIGH (ref 6–20)
CO2: 28 mmol/L (ref 22–32)
CO2: 29 mmol/L (ref 22–32)
CO2: 30 mmol/L (ref 22–32)
Calcium: 8.9 mg/dL (ref 8.9–10.3)
Calcium: 8.7 mg/dL — ABNORMAL LOW (ref 8.9–10.3)
Calcium: 8.5 mg/dL — ABNORMAL LOW (ref 8.9–10.3)
Chloride: 97 mmol/L — ABNORMAL LOW (ref 98–111)
Chloride: 99 mmol/L (ref 98–111)
Chloride: 99 mmol/L (ref 98–111)
Creatinine, Ser: 2.21 mg/dL — ABNORMAL HIGH (ref 0.61–1.24)
Creatinine, Ser: 2.34 mg/dL — ABNORMAL HIGH (ref 0.61–1.24)
Creatinine, Ser: 2.35 mg/dL — ABNORMAL HIGH (ref 0.61–1.24)
GFR calc Af Amer: 37 mL/min — ABNORMAL LOW (ref >60)
GFR calc Af Amer: 34 mL/min — ABNORMAL LOW (ref >60)
GFR calc Af Amer: 34 mL/min — ABNORMAL LOW (ref >60)
GFR calc non Af Amer: 32 mL/min — ABNORMAL LOW (ref >60)
GFR calc non Af Amer: 30 mL/min — ABNORMAL LOW (ref >60)
GFR calc non Af Amer: 30 mL/min — ABNORMAL LOW (ref >60)
Glucose, Bld: 321 mg/dL — ABNORMAL HIGH (ref 70–99)
Glucose, Bld: 216 mg/dL — ABNORMAL HIGH (ref 70–99)
Glucose, Bld: 174 mg/dL — ABNORMAL HIGH (ref 70–99)
Potassium: 4.1 mmol/L (ref 3.5–5.1)
Potassium: 3.9 mmol/L (ref 3.5–5.1)
Potassium: 3.8 mmol/L (ref 3.5–5.1)
Sodium: 137 mmol/L (ref 135–145)
Sodium: 141 mmol/L (ref 135–145)
Sodium: 140 mmol/L (ref 135–145)

## 2019-07-31 LAB — GLUCOSE, CAPILLARY
Glucose-Capillary: 150 mg/dL — ABNORMAL HIGH (ref 70–99)
Glucose-Capillary: 169 mg/dL — ABNORMAL HIGH (ref 70–99)
Glucose-Capillary: 172 mg/dL — ABNORMAL HIGH (ref 70–99)
Glucose-Capillary: 172 mg/dL — ABNORMAL HIGH (ref 70–99)
Glucose-Capillary: 173 mg/dL — ABNORMAL HIGH (ref 70–99)
Glucose-Capillary: 177 mg/dL — ABNORMAL HIGH (ref 70–99)
Glucose-Capillary: 194 mg/dL — ABNORMAL HIGH (ref 70–99)
Glucose-Capillary: 201 mg/dL — ABNORMAL HIGH (ref 70–99)
Glucose-Capillary: 232 mg/dL — ABNORMAL HIGH (ref 70–99)
Glucose-Capillary: 252 mg/dL — ABNORMAL HIGH (ref 70–99)
Glucose-Capillary: 254 mg/dL — ABNORMAL HIGH (ref 70–99)
Glucose-Capillary: 290 mg/dL — ABNORMAL HIGH (ref 70–99)
Glucose-Capillary: 319 mg/dL — ABNORMAL HIGH (ref 70–99)
Glucose-Capillary: 322 mg/dL — ABNORMAL HIGH (ref 70–99)
Glucose-Capillary: 331 mg/dL — ABNORMAL HIGH (ref 70–99)
Glucose-Capillary: 337 mg/dL — ABNORMAL HIGH (ref 70–99)
Glucose-Capillary: 491 mg/dL — ABNORMAL HIGH (ref 70–99)

## 2019-07-31 LAB — LACTIC ACID, PLASMA: Lactic Acid, Venous: 1.7 mmol/L (ref 0.5–1.9)

## 2019-07-31 LAB — PROCALCITONIN: Procalcitonin: 8.04 ng/mL

## 2019-07-31 LAB — TRIGLYCERIDES: Triglycerides: 76 mg/dL (ref <150)

## 2019-07-31 LAB — CBC
HCT: 47.7 % (ref 39.0–52.0)
Hemoglobin: 14.8 g/dL (ref 13.0–17.0)
MCH: 27.7 pg (ref 26.0–34.0)
MCHC: 31 g/dL (ref 30.0–36.0)
MCV: 89.2 fL (ref 80.0–100.0)
Platelets: 202 10*3/uL (ref 150–400)
RBC: 5.35 MIL/uL (ref 4.22–5.81)
RDW: 15.4 % (ref 11.5–15.5)
WBC: 15.2 10*3/uL — ABNORMAL HIGH (ref 4.0–10.5)
nRBC: 0 % (ref 0.0–0.2)

## 2019-07-31 LAB — PHOSPHORUS
Phosphorus: 3.4 mg/dL (ref 2.5–4.6)
Phosphorus: 3.7 mg/dL (ref 2.5–4.6)
Phosphorus: 3.7 mg/dL (ref 2.5–4.6)

## 2019-07-31 LAB — POCT I-STAT 7, (LYTES, BLD GAS, ICA,H+H)
Acid-Base Excess: 3 mmol/L — ABNORMAL HIGH (ref 0.0–2.0)
Bicarbonate: 30.6 mmol/L — ABNORMAL HIGH (ref 20.0–28.0)
Calcium, Ion: 1.16 mmol/L (ref 1.15–1.40)
HCT: 47 % (ref 39.0–52.0)
Hemoglobin: 16 g/dL (ref 13.0–17.0)
O2 Saturation: 99 %
Patient temperature: 37.5
Potassium: 3.9 mmol/L (ref 3.5–5.1)
Sodium: 141 mmol/L (ref 135–145)
TCO2: 32 mmol/L (ref 22–32)
pCO2 arterial: 57.1 mmHg — ABNORMAL HIGH (ref 32.0–48.0)
pH, Arterial: 7.34 — ABNORMAL LOW (ref 7.350–7.450)
pO2, Arterial: 172 mmHg — ABNORMAL HIGH (ref 83.0–108.0)

## 2019-07-31 LAB — ECHOCARDIOGRAM COMPLETE
Height: 72 in
Weight: 5121.73 oz

## 2019-07-31 LAB — MAGNESIUM
Magnesium: 1.6 mg/dL — ABNORMAL LOW (ref 1.7–2.4)
Magnesium: 1.6 mg/dL — ABNORMAL LOW (ref 1.7–2.4)
Magnesium: 2 mg/dL (ref 1.7–2.4)

## 2019-07-31 LAB — HEPARIN LEVEL (UNFRACTIONATED)
Heparin Unfractionated: 0.96 IU/mL — ABNORMAL HIGH (ref 0.30–0.70)
Heparin Unfractionated: 0.9 IU/mL — ABNORMAL HIGH (ref 0.30–0.70)
Heparin Unfractionated: 0.85 IU/mL — ABNORMAL HIGH (ref 0.30–0.70)

## 2019-07-31 LAB — BRAIN NATRIURETIC PEPTIDE: B Natriuretic Peptide: 209.5 pg/mL — ABNORMAL HIGH (ref 0.0–100.0)

## 2019-07-31 LAB — BETA-HYDROXYBUTYRIC ACID
Beta-Hydroxybutyric Acid: 0.09 mmol/L (ref 0.05–0.27)
Beta-Hydroxybutyric Acid: 0.1 mmol/L (ref 0.05–0.27)

## 2019-07-31 MED ORDER — SODIUM CHLORIDE 0.9% FLUSH
10.0000 mL | Freq: Two times a day (BID) | INTRAVENOUS | Status: DC
  Administered 2019-07-31 – 2019-08-11 (×21): 10 mL
  Administered 2019-08-12: 30 mL
  Administered 2019-08-13: 20 mL
  Administered 2019-08-13 – 2019-08-15 (×5): 10 mL
  Administered 2019-08-16: 20 mL
  Administered 2019-08-16 – 2019-09-06 (×37): 10 mL
  Administered 2019-09-07 (×2): 20 mL
  Administered 2019-09-08 – 2019-09-13 (×9): 10 mL
  Administered 2019-09-13: 20 mL
  Administered 2019-09-14 – 2019-09-15 (×3): 10 mL

## 2019-07-31 MED ORDER — SODIUM CHLORIDE 0.9% FLUSH
10.0000 mL | INTRAVENOUS | Status: DC | PRN
  Administered 2019-08-01: 10 mL

## 2019-07-31 MED ORDER — INSULIN ASPART 100 UNIT/ML ~~LOC~~ SOLN
3.0000 [IU] | SUBCUTANEOUS | Status: DC
  Administered 2019-07-31: 9 [IU] via SUBCUTANEOUS

## 2019-07-31 MED ORDER — ENOXAPARIN SODIUM 40 MG/0.4ML ~~LOC~~ SOLN
40.0000 mg | SUBCUTANEOUS | Status: DC
  Administered 2019-07-31 – 2019-08-01 (×2): 40 mg via SUBCUTANEOUS
  Filled 2019-07-31 (×2): qty 0.4

## 2019-07-31 MED ORDER — INSULIN REGULAR(HUMAN) IN NACL 100-0.9 UT/100ML-% IV SOLN
INTRAVENOUS | Status: AC
  Administered 2019-07-31: 11.5 [IU]/h via INTRAVENOUS
  Administered 2019-08-01: 5.5 [IU]/h via INTRAVENOUS
  Filled 2019-07-31 (×2): qty 100

## 2019-07-31 MED ORDER — INSULIN ASPART 100 UNIT/ML ~~LOC~~ SOLN
3.0000 [IU] | Freq: Once | SUBCUTANEOUS | Status: AC
  Administered 2019-07-31: 3 [IU] via SUBCUTANEOUS

## 2019-07-31 MED ORDER — PERFLUTREN LIPID MICROSPHERE
1.0000 mL | INTRAVENOUS | Status: AC | PRN
  Administered 2019-07-31: 2 mL via INTRAVENOUS
  Filled 2019-07-31: qty 10

## 2019-07-31 MED ORDER — CHLORHEXIDINE GLUCONATE CLOTH 2 % EX PADS
6.0000 | MEDICATED_PAD | Freq: Every day | CUTANEOUS | Status: DC
  Administered 2019-07-31 – 2019-08-16 (×18): 6 via TOPICAL

## 2019-07-31 MED ORDER — PROSOURCE TF PO LIQD
45.0000 mL | Freq: Two times a day (BID) | ORAL | Status: DC
  Administered 2019-07-31 – 2019-08-07 (×15): 45 mL
  Filled 2019-07-31 (×17): qty 45

## 2019-07-31 MED ORDER — FUROSEMIDE 10 MG/ML IJ SOLN
40.0000 mg | Freq: Two times a day (BID) | INTRAMUSCULAR | Status: DC
  Administered 2019-07-31 – 2019-08-03 (×6): 40 mg via INTRAVENOUS
  Filled 2019-07-31 (×6): qty 4

## 2019-07-31 MED ORDER — VITAL HIGH PROTEIN PO LIQD
1000.0000 mL | ORAL | Status: DC
  Administered 2019-07-31 – 2019-08-21 (×30): 1000 mL
  Filled 2019-07-31 (×2): qty 1000

## 2019-07-31 MED ORDER — MAGNESIUM SULFATE 2 GM/50ML IV SOLN
2.0000 g | Freq: Once | INTRAVENOUS | Status: AC
  Administered 2019-07-31: 2 g via INTRAVENOUS
  Filled 2019-07-31: qty 50

## 2019-07-31 MED ORDER — ENOXAPARIN SODIUM 30 MG/0.3ML ~~LOC~~ SOLN: 30.0000 mg | SUBCUTANEOUS | Status: DC

## 2019-07-31 MED ORDER — VITAL HIGH PROTEIN PO LIQD: 1000.0000 mL | ORAL | Status: DC

## 2019-07-31 MED ORDER — INSULIN DETEMIR 100 UNIT/ML ~~LOC~~ SOLN
38.0000 [IU] | Freq: Two times a day (BID) | SUBCUTANEOUS | Status: DC
  Administered 2019-07-31: 38 [IU] via SUBCUTANEOUS
  Filled 2019-07-31 (×2): qty 0.38

## 2019-07-31 MED ORDER — DEXTROSE 50 % IV SOLN: 0.0000 mL | INTRAVENOUS | Status: DC | PRN

## 2019-07-31 NOTE — Progress Notes (Signed)
Echocardiogram 2D Echocardiogram has been performed.  Oneal Deputy [place] Seaborn 07/31/2019, 11:56 AM

## 2019-07-31 NOTE — Progress Notes (Signed)
Lower extremity venous has been completed.   Preliminary results in CV Proc.   Abram Sander 07/31/2019 1:50 PM

## 2019-07-31 NOTE — Progress Notes (Signed)
[facility] for heparin Indication: R/o PE   Assessment: 78 YOM who presents with hypoxic/hypercarbic respiratory failure requiring intubation. Pharmacy consulted to start IV heparin due to some concern for PE. H/H and Plt wnl  Initial heparin level 0.96 units/ml  Goal of Therapy:  Heparin level 0.3-0.7 units/ml Monitor platelets by anticoagulation protocol: Yes   Plan:  -Decrease heparin drip to 1800units/hr -F/u 6 hr HL -Monitor daily HL, CBC and s/s of bleeding  Thanks for allowing pharmacy to be a part of this patient's care.  Excell Seltzer, PharmD Clinical Pharmacist

## 2019-07-31 NOTE — Progress Notes (Signed)
[facility] for heparin Indication:  R/o PE   Assessment: 55 YOM who presents with hypoxic/hypercarbic respiratory failure requiring intubation.  Initially concerned for PE - no hx of dvt/pe noted Low on differential now but still planning on w/u - unable to do CTA d/t renal fx  Pending Dopplers of LE  Hep lvl remains elevated at 0.85  Goal of Therapy:  Heparin level 0.3-0.7 units/ml Monitor platelets by anticoagulation protocol: Yes   Plan:  Decrease heparin to 1600 units/hr Recheck at 1800 Daily hep lvl cbc  F/u vte workup  Barth Kirks, PharmD, BCPS, BCCCP Clinical Pharmacist [phone]  Please check AMION for all [place] numbers  07/31/2019 11:16 AM

## 2019-07-31 NOTE — Progress Notes (Signed)
Initial Nutrition Assessment  DOCUMENTATION CODES:   Morbid obesity  INTERVENTION:  Via OG tube: -Begin Vital High Protein at 29ml/hr, increase 57ml/hr Q4H until goal rate of 87ml/hr (1664ml) is reached -63ml Prosource tube feeding per tube BID  TF regimen and propofol at current rate providing 1876 total kcal/day (100% of kcal needs), 169 grams of protein, 1414ml free water  NUTRITION DIAGNOSIS:   Inadequate oral intake related to inability to eat as evidenced by NPO status.    GOAL:   Patient will meet greater than or equal to 90% of their needs    MONITOR:   TF tolerance, Vent status, Weight trends, Labs, I & O's  REASON FOR ASSESSMENT:   Consult Enteral/tube feeding initiation and management  ASSESSMENT:   Pt with baseline chronic respiratory failure on the basis of severe OHS/OSA complicated further by significant obstructive disease in the setting of COPD.  Presents with acute on chronic hypoxic and hypercarbic respiratory failure.  Admitted with the initial working diagnosis of pulmonary edema and decompensated diastolic heart failure. PMH includes HTN, COPD, DM.  7/11 intubated, R IF triple-lumen catheter    Pt with gastric OG tube, tip confirmed via xray.   Discussed pt with RN.   Patient is currently intubated on ventilator support MV: 12.12 L/min Temp (24hrs), Avg:98.9 F (37.2 C), Min:96.8 F (36 C), Max:99.7 F (37.6 C)  Propofol: 4.41 ml/hr, providing 116 kcals  Pt with 9.3% wt loss x 6 months, not significant for time frame.   UOP: 857ml x24 hours I/O: +111.78ml since admit  TF: Vital High Protein @ 30ml/hr, 62ml  Prosource TF BID  Labs: Mg 1.6 (L), CBGs [phone] Medications: Lasix, Novolog, Levemir, colace, Protonix, Miralax, Myxredlin, Propofol  NUTRITION - FOCUSED PHYSICAL EXAM:    Most Recent Value  Orbital Region No depletion  Upper Arm Region No depletion  Thoracic and Lumbar Region No depletion  Buccal Region No depletion   Temple Region No depletion  Clavicle Bone Region No depletion  Clavicle and Acromion Bone Region No depletion  Scapular Bone Region No depletion  Dorsal Hand No depletion  Patellar Region No depletion  Anterior Thigh Region No depletion  Posterior Calf Region No depletion  Edema (RD Assessment) None  Hair Reviewed  Eyes Reviewed  Mouth Reviewed  Skin Reviewed  Nails Reviewed       Diet Order:   Diet Order            Diet NPO time specified  Diet effective now                 EDUCATION NEEDS:   No education needs have been identified at this time  Skin:  Skin Assessment: Reviewed RN Assessment  Last BM:  unknown  Height:   Ht Readings from Last 1 Encounters:  07/30/19 6' (1.829 m)    Weight:   Wt Readings from Last 1 Encounters:  07/31/19 (!) 145.2 kg    Ideal Body Weight:  80.9 kg  BMI:  Body mass index is 43.41 kg/m.  Estimated Nutritional Needs:   Kcal:  2924-4628  Protein:  160-200  Fluid:  >1.6L/d    Larkin Ina, MS, RD, LDN RD pager number and weekend/on-call pager number located in [place].

## 2019-07-31 NOTE — Progress Notes (Signed)
PCCM progress note  Echo reviewed with no evidence of RV dysfunction, LVEF 50-55% No DVT on lower extremity duplex  Suspicion for PE is low We will stop full dose heparin anticoagulation and start Lovenox for DVT prophylaxis.  Marshell Garfinkel MD [facility] Please see [email or web address] for pager details.  07/31/2019, 5:12 PM

## 2019-07-31 NOTE — Progress Notes (Addendum)
NAME:  Timothy Le, MRN:  [id number], DOB:  1960-02-12, LOS: 1 ADMISSION DATE:  07/30/2019, CONSULTATION DATE:  7/11 REFERRING MD:  Mariane Masters, CHIEF COMPLAINT:  Acute respiratory failure    Brief History   59 year old black male with baseline chronic respiratory failure on the basis of severe OHS/OSA complicated further by significant obstructive disease in the setting of COPD.  Presents with acute on chronic hypoxic and hypercarbic respiratory failure.  Admitted with the initial working diagnosis of pulmonary edema and decompensated diastolic heart failure. Per family patient was without CPAP for 2 weeks PTA as his machine broke.  History of present illness   59 year old male patient known to pulmonary division followed for chronic respiratory failure on the basis of severe COPD, as well as significant restrictive disease by obesity hypoventilation and obstructive sleep apnea.  He is managed on BiPAP, after successfully having significant weight loss which allowed him to have tracheostomy removed following successful titration study.  He has been followed by Dr. Annamaria Boots in the outpatient setting last seen in May.  And had been doing well followed by a nutritionist and walking daily. Presents to the emergency room via EMS today on 7/11.  Apparently had been taking garbage out, on return to the house was acutely short of breath with significant wheezing.  He tried bronchodilator, also placed himself on his home BiPAP device without improvement EMS was therefore called fire arrived first finding him minimally responsive he was manually assisted ventilation with bag valve mask, given albuterol nebulizer and transferred to the emergency room.  He was unresponsive on presentation and was intubated for airway protection, marked hypoxia, and presumptive hypercarbia.  After airway was stabilized The following initial diagnostic data was identified: White blood cell count 14.4, initial venous blood gas PCO2 64,  bicarbonate 28, pH 7.25, follow-up intubation arterial blood gas pH 7.22, PCO2 74, PO2 40 bicarbonate 30, pulse oximetry 63%.  Portable chest x-ray with diffuse pulmonary edema endotracheal tube in satisfactory position.  On i-STAT chemistry creatinine 1.5 up from baseline of 1.35-1.43 glucose 652 Critical care was asked to admit  Past Medical History  History of OHS, OSA, diastolic heart failure, chronic respiratory failure on this basis.  Also has a history of prior tracheostomy this was removed after significant weight loss and successful BiPAP titration study.  Morbid obesity, diabetes type 2, anemia, COPD with significant obstructive disease and slight response to bronchodilators FEV1 1.77/59% predicted prior smoker  Significant Hospital Events   7/11 and admitted: Intubated.  Requiring high PEEP for refractory hypoxia, central line placed, IV diuresis administered   Consults:    Procedures:  Endotracheal tube 7/11 Right IJ triple-lumen catheter 7/11  Significant Diagnostic Tests:  Echocardiogram 7/11 >> Lower extremity ultrasound 7/11 >> Urine drug screen 7/11 >> Micro Data:  Sputum culture 7/11 >> Blood culture 7/11 >>  Antimicrobials:  None  Interim history/subjective:  Patient remains sedated this morning on propofol infusion at 15 mcg/min. Minimally responsive on exam. CXR notes interval improvement in diffuse interstitial and airspace disease.   Objective   Blood pressure 94/74, pulse 87, temperature 99.1 F (37.3 C), resp. rate 19, height 6' (1.829 m), weight (!) 145.2 kg, SpO2 94 %. CVP:  [8 mmHg-13 mmHg] 11 mmHg  Vent Mode: PRVC FiO2 (%):  [100 %] 100 % Set Rate:  [30 bmp] 30 bmp Vt Set:  [400 mL] 400 mL PEEP:  [16 cmH20] 16 cmH20 Plateau Pressure:  [31 cmH20] 31 cmH20   Intake/Output Summary (Last  24 hours) at 07/31/2019 0858 Last data filed at 07/31/2019 0600 Gross per 24 hour  Intake 647.79 ml  Output 827 ml  Net -179.21 ml   Filed Weights   07/30/19  1600 07/30/19 2155 07/31/19 0500  Weight: 100 kg (!) 146.9 kg (!) 145.2 kg    Examination: General: Obese 59 year old black male intubated and sedated on propofol HEENT normocephalic atraumatic neck is large no clear JVD mucous membranes moist, old healed tracheostomy site midline Lungs: Mechanical breath sounds throughout, diminished in the bases no accessory use currently FiO2 100/PEEP 16. SpO2 currently in mid 90s.  Cardiovascular: Distant heart sounds. Regular rate and rhythm Abdomen: Obese abdomen with positive bowel sounds Extremities: Lower extremity edema present pulses palpable Neuro: Sedated GU: Due to void Foley catheter placed  Resolved Hospital Problem list     Assessment & Plan:  Acute on chronic hypoxic and hypercarbic respiratory failure in the setting of what appears to be pulmonary edema likely due to acute diastolic heart failure, all complicated by underlying severe OHS and OSA as well as  COPD -He has been fairly compliant and motivated from a pulmonary standpoint, certainly his underlying lung disease and body habitus contributing  -CXR suggestive of diffuse pulmonary edema which is the likely cause of his hypoxia. PE possible given acuteness of presentation but less likely. Plan -Continue full ventilatory support, PEEP 16 mm Hg, RR 30, tidal volume 400 mL -PEEP titrated for saturation goal greater than 88 -PAD protocol with RASS goal -2 -Net negative 179 mL in past 24 hr; continue IV diuresis with Lasix 80 mg q12 -Scheduled bronchodilators -Hold antihypertensives in setting of soft BPs -F/u sputum culture -VAP bundle -Echo scheduled for today to assess for RV strain or new-onset LV dysfunction. Will follow results. -Lower extremity Dopplers to complete workup for PE.   Acute diastolic heart failure with pulmonary edema.He has known history of HFpEF with last recorded EF 55 to 60% he also has history of prior RV dilation Plan -Decrease Lasix to 40 mg IV q12 in  setting of AKI -IV beta-blockade for tachycardia as this will exacerbate diastolic dysfunction -F/u echo as mentioned above -Tele  Severe lactic acidosis .  This is not sepsis.  Almost certainly a consequence of his increase respiratory failure ABG improving today with arterial pH 7.340, improved from 7.219 yesterday. Lactate now down-trending at 4.5 from 6.0. Plan -Supportive care  Acute metabolic encephalopathy secondary to hypercarbia and hypoxia Plan -Supportive care -PAD protocol -Continue home Zoloft  CKD stage II/III with AKI Cr 2.34 this morning. Up-trending from yesterday. Baseline creatinine approximately 1.4. Plan -IV Lasix decrease to 40 mg q12 -Renal dose medications -BP control -Foley -Strict I/O's -Trend Cr with daily BMPs  Diabetes with severe hyperglycemia Plan - IV insulin protocol - f/u Beta hydroxybutyric acid  Mild leukocytosis  WBC 15.2 today, up from 14.4. Burtis Junes this is due to overall inflammatory state in setting of lactic acidosis.  Plan -trend cbc   Best practice:  Diet: NPO Pain/Anxiety/Delirium protocol (if indicated): ordered VAP protocol (if indicated): oredere DVT prophylaxis: IV heparin  GI prophylaxis: PPI Glucose control: insulin gtt Mobility: BR Code Status: full code  Family Communication: Sister updated 7/12 Disposition:  ICU admit  Labs   CBC: Recent Labs  Lab 07/30/19 1718 07/30/19 1724 07/30/19 1741 07/31/19 0516 07/31/19 0520  WBC 14.4*  --   --   --  15.2*  NEUTROABS 7.4  --   --   --   --  HGB 14.6  17.0 17.0 16.0 16.0 14.8  HCT 47.1  50.0 50.0 47.0 47.0 47.7  MCV 90.6  --   --   --  89.2  PLT 295  --   --   --  387    Basic Metabolic Panel: Recent Labs  Lab 07/30/19 1718 07/30/19 1724 07/30/19 1741 07/30/19 2155 07/30/19 2258 07/31/19 0212 07/31/19 0516 07/31/19 0520  NA 134*  137   < > 136  --  136 137 141 141  K 4.5  4.8   < > 3.6  --  4.0 4.1 3.9 3.9  CL 92*  93*  --   --   --  95* 97*   --  99  CO2 25  --   --   --  25 28  --  29  GLUCOSE 672*  652*  --   --   --  666* 321*  --  216*  BUN 24*  37*  --   --   --  29* 32*  --  33*  CREATININE 1.81*  1.50*  --   --   --  2.01* 2.21*  --  2.34*  CALCIUM 8.6*  --   --   --  8.5* 8.9  --  8.7*  MG  --   --   --  1.4*  --   --   --  1.6*  PHOS  --   --   --   --   --   --   --  3.4   < > = values in this interval not displayed.   GFR: Estimated Creatinine Clearance: 51.5 mL/min (A) (by C-G formula based on SCr of 2.34 mg/dL (H)). Recent Labs  Lab 07/30/19 1718 07/30/19 2155 07/30/19 2156 07/31/19 0520  PROCALCITON  --  3.64  --   --   WBC 14.4*  --   --  15.2*  LATICACIDVEN 6.0*  --  4.5*  --     Liver Function Tests: No results for input(s): AST, ALT, ALKPHOS, BILITOT, PROT, ALBUMIN in the last 168 hours. Recent Labs  Lab 07/30/19 2155  LIPASE 27   No results for input(s): AMMONIA in the last 168 hours.  ABG    Component Value Date/Time   PHART 7.340 (L) 07/31/2019 0516   PCO2ART 57.1 (H) 07/31/2019 0516   PO2ART 172 (H) 07/31/2019 0516   HCO3 30.6 (H) 07/31/2019 0516   TCO2 32 07/31/2019 0516   ACIDBASEDEF 1.0 07/30/2019 1724   O2SAT 99.0 07/31/2019 0516     Coagulation Profile: No results for input(s): INR, PROTIME in the last 168 hours.  Cardiac Enzymes: No results for input(s): CKTOTAL, CKMB, CKMBINDEX, TROPONINI in the last 168 hours.  HbA1C: Hgb A1c MFr Bld  Date/Time Value Ref Range Status  03/04/2019 07:08 AM 16.6 (H) 4.8 - 5.6 % Final    Comment:    (NOTE) Pre diabetes:          5.7%-6.4% Diabetes:              >6.4% Glycemic control for   <7.0% adults with diabetes   03/03/2019 01:55 PM >15.5 (H) 4.8 - 5.6 % Final    Comment:    (NOTE) **Verified by repeat analysis**         Prediabetes: 5.7 - 6.4         Diabetes: >6.4         Glycemic control for adults with diabetes: <7.0  CBG: Recent Labs  Lab 07/31/19 0344 07/31/19 0520 07/31/19 0634 07/31/19 0730  07/31/19 0851  GLUCAP 232* 201* 194* 172* 177*    Review of Systems:   Not able   Past Medical History  He,  has a past medical history of Asthma, Chronic respiratory failure with hypoxia ([place]) (07/09/2017), COPD (chronic obstructive pulmonary disease) ([place]), Diabetes mellitus without complication ([place]), Difficult intubation, Hypertension, Shortness of breath dyspnea, and Sleep apnea.   Surgical History    Past Surgical History:  Procedure Laterality Date  . IR GASTROSTOMY TUBE MOD SED  07/05/2017  . TRACHEOSTOMY TUBE PLACEMENT N/A 06/17/2017   Procedure: TRACHEOSTOMY;  Surgeon: Leta Baptist, MD;  Location: [facility] OR;  Service: ENT;  Laterality: N/A;     Social History   reports that he quit smoking about 6 years ago. His smoking use included cigarettes. He started smoking about 36 years ago. He has a 0.25 pack-year smoking history. He has never used smokeless tobacco. He reports that he does not drink alcohol and does not use drugs.   Family History   His family history includes Asthma in his sister and another family member; Diabetes type II in his sister; Hypertension in his sister.   Allergies No Known Allergies   Home Medications  Prior to Admission medications   Medication Sig Start Date End Date Taking? Authorizing Provider  allopurinol (ZYLOPRIM) 100 MG tablet Take 100 mg by mouth daily.    [provider]  atorvastatin (LIPITOR) 10 MG tablet Take 10 mg by mouth daily.    [provider]  budesonide-formoterol (SYMBICORT) 160-4.5 MCG/ACT inhaler Inhale 2 puffs then rinse mouth, twice daiy Patient taking differently: Inhale 1 puff into the lungs 2 (two) times daily. Rinse mouth after use 01/24/19   Young, Tarri Fuller D, MD  desonide (DESOWEN) 0.05 % lotion Apply 1 application topically daily. Apply to face    [provider]  insulin aspart (NOVOLOG FLEXPEN) 100 UNIT/ML FlexPen Inject 8 Units into the skin 3 (three) times daily with meals. 03/06/19   Marianna Payment, MD  insulin glargine (LANTUS) 100 unit/mL SOPN Inject 0.35 mLs (35 Units total) into the skin at bedtime. 03/06/19 04/05/19  Marianna Payment, MD  Insulin Pen Needle (BD ULTRA-FINE PEN NEEDLES) 29G X 12.7MM MISC USE AS DIRECTED WITH LANTUS 12/23/17   [provider]  ketoconazole (NIZORAL) 2 % cream Apply 1 application topically daily. Apply to wounds    [provider]  metFORMIN (GLUCOPHAGE) 1000 MG tablet Take 1 tablet (1,000 mg total) by mouth 2 (two) times daily with a meal. 03/06/19 03/05/20  Chundi, Verne Spurr, MD  metolazone (ZAROXOLYN) 2.5 MG tablet Take 2.5 mg by mouth daily.    [provider]  potassium chloride SA (K-DUR,KLOR-CON) 20 MEQ tablet Take 1 tablet (20 mEq total) by mouth daily. 07/29/17   Angiulli, Lavon Paganini, PA-C  sertraline (ZOLOFT) 100 MG tablet Take 100 mg by mouth daily.     [provider]  torsemide (DEMADEX) 5 MG tablet Take 5 mg by mouth daily. 01/17/19   [provider]     Critical care time:       Riccardo Dubin, MS4  ----------------------------------------------------------------  Attending note: I have seen and examined the patient. History, labs and imaging reviewed.  Briefly this is a 59 Y/O with OSA/OHS, COPD admitted with acute on chronic hypoxic, hypercarbic resp failure in setting of pulmonary edema Remains on vent. No acute events overnight.   Blood pressure (!) 82/69, pulse 74,  temperature 99 F (37.2 C), resp. rate (!) 34, height 6' (1.829 m), weight (!) 145.2 kg, SpO2 93 %. Gen:      No acute distress HEENT:  EOMI, sclera anicteric Neck:     No masses; no thyromegaly, ETT Lungs:    Clear to auscultation bilaterally; normal respiratory effort CV:         Regular rate and rhythm; no murmurs Abd:      + bowel sounds; soft, non-tender; no palpable masses, no distension Ext:    No edema; adequate peripheral perfusion Skin:      Warm and dry; no rash Neuro: Sedated, arousable  Labs/Imaging  personally reviewed, significant for Bun/Cr is higher WBC 15.2 No new imaging  Assessment/plan: Acute hypoxic/hypercapnic respiratory failure  Acute COPD exacerbation Acute on chronic diastolic congestive heart failure  PE on differential. Pt is empirically anticoagulation. We will check echo and LE duplex for evidence of RV dysfunction or DVT. He cannot get CTA as Cr is higher. Will wean dowm PEEP and Fio2 as tolerated Reduce lasix to 40 mg q12 and watch the Cr.  Elevated lactic acid Not felt to be from sepsis but due to resp effort Repeat labs today Check procalcitonin Observe off antibiotics   The patient is critically ill with multiple organ systems failure and requires high complexity decision making for assessment and support, frequent evaluation and titration of therapies, application of advanced monitoring technologies and extensive interpretation of multiple databases.  Critical care time - 35 mins. This represents my time independent of the NPs time taking care of the pt.  Marshell Garfinkel MD [facility] 07/31/2019, 10:35 AM

## 2019-08-01 ENCOUNTER — Inpatient Hospital Stay (HOSPITAL_COMMUNITY): Payer: Medicaid Other

## 2019-08-01 DIAGNOSIS — J9602 Acute respiratory failure with hypercapnia: Secondary | ICD-10-CM

## 2019-08-01 DIAGNOSIS — J9601 Acute respiratory failure with hypoxia: Secondary | ICD-10-CM

## 2019-08-01 LAB — CBC
HCT: 41.2 % (ref 39.0–52.0)
Hemoglobin: 13 g/dL (ref 13.0–17.0)
MCH: 28 pg (ref 26.0–34.0)
MCHC: 31.6 g/dL (ref 30.0–36.0)
MCV: 88.6 fL (ref 80.0–100.0)
Platelets: 178 10*3/uL (ref 150–400)
RBC: 4.65 MIL/uL (ref 4.22–5.81)
RDW: 15.9 % — ABNORMAL HIGH (ref 11.5–15.5)
WBC: 10.6 10*3/uL — ABNORMAL HIGH (ref 4.0–10.5)
nRBC: 0 % (ref 0.0–0.2)

## 2019-08-01 LAB — GLUCOSE, CAPILLARY
Glucose-Capillary: 167 mg/dL — ABNORMAL HIGH (ref 70–99)
Glucose-Capillary: 176 mg/dL — ABNORMAL HIGH (ref 70–99)
Glucose-Capillary: 183 mg/dL — ABNORMAL HIGH (ref 70–99)
Glucose-Capillary: 186 mg/dL — ABNORMAL HIGH (ref 70–99)
Glucose-Capillary: 192 mg/dL — ABNORMAL HIGH (ref 70–99)
Glucose-Capillary: 195 mg/dL — ABNORMAL HIGH (ref 70–99)
Glucose-Capillary: 199 mg/dL — ABNORMAL HIGH (ref 70–99)
Glucose-Capillary: 200 mg/dL — ABNORMAL HIGH (ref 70–99)
Glucose-Capillary: 208 mg/dL — ABNORMAL HIGH (ref 70–99)
Glucose-Capillary: 225 mg/dL — ABNORMAL HIGH (ref 70–99)
Glucose-Capillary: 225 mg/dL — ABNORMAL HIGH (ref 70–99)
Glucose-Capillary: 261 mg/dL — ABNORMAL HIGH (ref 70–99)
Glucose-Capillary: 278 mg/dL — ABNORMAL HIGH (ref 70–99)
Glucose-Capillary: 307 mg/dL — ABNORMAL HIGH (ref 70–99)

## 2019-08-01 LAB — BASIC METABOLIC PANEL
Anion gap: 12 (ref 5–15)
Anion gap: 11 (ref 5–15)
BUN: 53 mg/dL — ABNORMAL HIGH (ref 6–20)
BUN: 63 mg/dL — ABNORMAL HIGH (ref 6–20)
CO2: 34 mmol/L — ABNORMAL HIGH (ref 22–32)
CO2: 36 mmol/L — ABNORMAL HIGH (ref 22–32)
Calcium: 8.8 mg/dL — ABNORMAL LOW (ref 8.9–10.3)
Calcium: 8.2 mg/dL — ABNORMAL LOW (ref 8.9–10.3)
Chloride: 96 mmol/L — ABNORMAL LOW (ref 98–111)
Chloride: 94 mmol/L — ABNORMAL LOW (ref 98–111)
Creatinine, Ser: 1.9 mg/dL — ABNORMAL HIGH (ref 0.61–1.24)
Creatinine, Ser: 1.74 mg/dL — ABNORMAL HIGH (ref 0.61–1.24)
GFR calc Af Amer: 44 mL/min — ABNORMAL LOW (ref >60)
GFR calc Af Amer: 49 mL/min — ABNORMAL LOW (ref >60)
GFR calc non Af Amer: 38 mL/min — ABNORMAL LOW (ref >60)
GFR calc non Af Amer: 43 mL/min — ABNORMAL LOW (ref >60)
Glucose, Bld: 196 mg/dL — ABNORMAL HIGH (ref 70–99)
Glucose, Bld: 272 mg/dL — ABNORMAL HIGH (ref 70–99)
Potassium: 3 mmol/L — ABNORMAL LOW (ref 3.5–5.1)
Potassium: 3 mmol/L — ABNORMAL LOW (ref 3.5–5.1)
Sodium: 142 mmol/L (ref 135–145)
Sodium: 141 mmol/L (ref 135–145)

## 2019-08-01 LAB — MAGNESIUM
Magnesium: 2.1 mg/dL (ref 1.7–2.4)
Magnesium: 2.1 mg/dL (ref 1.7–2.4)

## 2019-08-01 LAB — TRIGLYCERIDES: Triglycerides: 198 mg/dL — ABNORMAL HIGH (ref <150)

## 2019-08-01 LAB — PHOSPHORUS: Phosphorus: 3.4 mg/dL (ref 2.5–4.6)

## 2019-08-01 LAB — BRAIN NATRIURETIC PEPTIDE: B Natriuretic Peptide: 85.2 pg/mL (ref 0.0–100.0)

## 2019-08-01 LAB — PROCALCITONIN: Procalcitonin: 8.16 ng/mL

## 2019-08-01 MED ORDER — INSULIN REGULAR(HUMAN) IN NACL 100-0.9 UT/100ML-% IV SOLN
INTRAVENOUS | Status: DC
  Administered 2019-08-01: 11.5 [IU]/h via INTRAVENOUS
  Administered 2019-08-02: 14 [IU]/h via INTRAVENOUS
  Filled 2019-08-01: qty 100

## 2019-08-01 MED ORDER — FENTANYL BOLUS VIA INFUSION
50.0000 ug | INTRAVENOUS | Status: DC | PRN
  Administered 2019-08-01 – 2019-08-11 (×28): 50 ug via INTRAVENOUS
  Filled 2019-08-01: qty 50

## 2019-08-01 MED ORDER — INSULIN ASPART 100 UNIT/ML ~~LOC~~ SOLN
14.0000 [IU] | Freq: Once | SUBCUTANEOUS | Status: AC
  Administered 2019-08-01: 14 [IU] via SUBCUTANEOUS

## 2019-08-01 MED ORDER — DEXTROSE 50 % IV SOLN: 0.0000 mL | INTRAVENOUS | Status: DC | PRN

## 2019-08-01 MED ORDER — FENTANYL 2500MCG IN NS 250ML (10MCG/ML) PREMIX INFUSION
50.0000 ug/h | INTRAVENOUS | Status: DC
  Administered 2019-08-01: 50 ug/h via INTRAVENOUS
  Administered 2019-08-02: 100 ug/h via INTRAVENOUS
  Administered 2019-08-03 (×2): 400 ug/h via INTRAVENOUS
  Administered 2019-08-03: 300 ug/h via INTRAVENOUS
  Administered 2019-08-04 (×3): 400 ug/h via INTRAVENOUS
  Administered 2019-08-05: 250 ug/h via INTRAVENOUS
  Administered 2019-08-05 (×2): 400 ug/h via INTRAVENOUS
  Administered 2019-08-06 – 2019-08-07 (×4): 250 ug/h via INTRAVENOUS
  Administered 2019-08-07: 175 ug/h via INTRAVENOUS
  Administered 2019-08-08: 150 ug/h via INTRAVENOUS
  Administered 2019-08-08: 100 ug/h via INTRAVENOUS
  Administered 2019-08-09 (×2): 175 ug/h via INTRAVENOUS
  Administered 2019-08-10: 300 ug/h via INTRAVENOUS
  Administered 2019-08-10: 250 ug/h via INTRAVENOUS
  Administered 2019-08-10: 200 ug/h via INTRAVENOUS
  Administered 2019-08-11 (×2): 250 ug/h via INTRAVENOUS
  Administered 2019-08-11 – 2019-08-12 (×2): 300 ug/h via INTRAVENOUS
  Filled 2019-08-01 (×26): qty 250

## 2019-08-01 MED ORDER — INSULIN ASPART 100 UNIT/ML ~~LOC~~ SOLN
14.0000 [IU] | SUBCUTANEOUS | Status: DC
  Administered 2019-08-01 (×3): 14 [IU] via SUBCUTANEOUS

## 2019-08-01 MED ORDER — SODIUM CHLORIDE 0.9 % IV SOLN
500.0000 mg | INTRAVENOUS | Status: DC
  Administered 2019-08-01 – 2019-08-04 (×4): 500 mg via INTRAVENOUS
  Filled 2019-08-01 (×4): qty 500

## 2019-08-01 MED ORDER — SODIUM CHLORIDE 0.9 % IV SOLN
2.0000 g | INTRAVENOUS | Status: DC
  Administered 2019-08-01 – 2019-08-04 (×4): 2 g via INTRAVENOUS
  Filled 2019-08-01 (×4): qty 20

## 2019-08-01 MED ORDER — DEXTROSE 10 % IV SOLN: INTRAVENOUS | Status: DC | PRN

## 2019-08-01 MED ORDER — INSULIN ASPART 100 UNIT/ML ~~LOC~~ SOLN
2.0000 [IU] | SUBCUTANEOUS | Status: DC
  Administered 2019-08-01 (×2): 6 [IU] via SUBCUTANEOUS

## 2019-08-01 MED ORDER — INSULIN DETEMIR 100 UNIT/ML ~~LOC~~ SOLN
40.0000 [IU] | Freq: Two times a day (BID) | SUBCUTANEOUS | Status: DC
  Administered 2019-08-01 (×2): 40 [IU] via SUBCUTANEOUS
  Filled 2019-08-01 (×3): qty 0.4

## 2019-08-01 MED ORDER — POTASSIUM CHLORIDE 20 MEQ/15ML (10%) PO SOLN
20.0000 meq | ORAL | Status: AC
  Administered 2019-08-01 (×2): 20 meq
  Filled 2019-08-01 (×2): qty 15

## 2019-08-01 MED ORDER — FENTANYL CITRATE (PF) 100 MCG/2ML IJ SOLN: 50.0000 ug | Freq: Once | INTRAMUSCULAR | Status: AC

## 2019-08-01 MED ORDER — POTASSIUM CHLORIDE 10 MEQ/50ML IV SOLN
10.0000 meq | INTRAVENOUS | Status: AC
  Administered 2019-08-01 (×4): 10 meq via INTRAVENOUS
  Filled 2019-08-01 (×4): qty 50

## 2019-08-01 MED ORDER — POTASSIUM CHLORIDE 10 MEQ/50ML IV SOLN
10.0000 meq | INTRAVENOUS | Status: AC
  Administered 2019-08-02 (×4): 10 meq via INTRAVENOUS
  Filled 2019-08-01 (×4): qty 50

## 2019-08-01 NOTE — Progress Notes (Addendum)
NAME:  Timothy Le, MRN:  [id number], DOB:  Jan 21, 1960, LOS: 2 ADMISSION DATE:  07/30/2019, CONSULTATION DATE:  7/11 REFERRING MD:  Mariane Masters, CHIEF COMPLAINT:  Acute respiratory failure    Brief History   59 year old black male with baseline chronic respiratory failure on the basis of severe OHS/OSA complicated further by significant obstructive disease in the setting of COPD.  Presents with acute on chronic hypoxic and hypercarbic respiratory failure.  Admitted with the initial working diagnosis of pulmonary edema and decompensated diastolic heart failure. Per family patient was without CPAP for 2 weeks PTA as his machine broke.  History of present illness   59 year old male patient known to pulmonary division followed for chronic respiratory failure on the basis of severe COPD, as well as significant restrictive disease by obesity hypoventilation and obstructive sleep apnea.  He is managed on BiPAP, after successfully having significant weight loss which allowed him to have tracheostomy removed following successful titration study.  He has been followed by Dr. Annamaria Boots in the outpatient setting last seen in May.  And had been doing well followed by a nutritionist and walking daily. Presents to the emergency room via EMS today on 7/11.  Apparently had been taking garbage out, on return to the house was acutely short of breath with significant wheezing.  He tried bronchodilator, also placed himself on his home BiPAP device without improvement EMS was therefore called fire arrived first finding him minimally responsive he was manually assisted ventilation with bag valve mask, given albuterol nebulizer and transferred to the emergency room.  He was unresponsive on presentation and was intubated for airway protection, marked hypoxia, and presumptive hypercarbia.  After airway was stabilized The following initial diagnostic data was identified: White blood cell count 14.4, initial venous blood gas PCO2 64,  bicarbonate 28, pH 7.25, follow-up intubation arterial blood gas pH 7.22, PCO2 74, PO2 40 bicarbonate 30, pulse oximetry 63%.  Portable chest x-ray with diffuse pulmonary edema endotracheal tube in satisfactory position.  On i-STAT chemistry creatinine 1.5 up from baseline of 1.35-1.43 glucose 652 Critical care was asked to admit  Past Medical History  History of OHS, OSA, diastolic heart failure, chronic respiratory failure on this basis.  Also has a history of prior tracheostomy this was removed after significant weight loss and successful BiPAP titration study.  Morbid obesity, diabetes type 2, anemia, COPD with significant obstructive disease and slight response to bronchodilators FEV1 1.77/59% predicted prior smoker  Significant Hospital Events   7/11 and admitted: Intubated.  Requiring high PEEP for refractory hypoxia, central line placed, IV diuresis administered   Consults:    Procedures:  Endotracheal tube 7/11 Right IJ triple-lumen catheter 7/11  Significant Diagnostic Tests:  Echocardiogram 7/11 >> no evidence of RV strain Lower extremity ultrasound 7/11 >> no evidence of lower extremity DVT CXR 7/13 >> interval improvement; airspace disease consistent with multifocal pneumonia per radiology report Micro Data:  Sputum culture 7/11 >> Blood culture 7/11 >>  Antimicrobials:  None  Interim history/subjective:  Patient is resting comfortably in bed this morning. Remains on propofol drip but is interactive on exam, communicating with gestures and hand-writing.  Objective   Blood pressure 116/75, pulse 64, temperature 99.1 F (37.3 C), resp. rate (!) 30, height 6' (1.829 m), weight (!) 146.1 kg, SpO2 94 %. CVP:  [9 mmHg-11 mmHg] 9 mmHg  Vent Mode: PRVC FiO2 (%):  [60 %-100 %] 60 % Set Rate:  [30 bmp] 30 bmp Vt Set:  [400 mL] 400 mL  PEEP:  [5 cmH20-16 cmH20] 16 cmH20 Plateau Pressure:  [25 cmH20-29 cmH20] 29 cmH20   Intake/Output Summary (Last 24 hours) at 08/01/2019  0835 Last data filed at 08/01/2019 0800 Gross per 24 hour  Intake 1829.62 ml  Output 2940 ml  Net -1110.38 ml   Filed Weights   07/30/19 2155 07/31/19 0500 08/01/19 0206  Weight: (!) 146.9 kg (!) 145.2 kg (!) 146.1 kg   Examination: General: Obese 59 year old black male intubated and sedated on propofol HEENT: normocephalic atraumatic neck is large no clear JVD mucous membranes moist, old healed tracheostomy site midline Lungs: Mechanical breath sounds throughout, diminished in the bases no accessory use currently FiO2 100/PEEP 16. SpO2 currently in mid 90s.  Cardiovascular: Distant heart sounds. Regular rate and rhythm Abdomen: Obese abdomen with positive bowel sounds, non-tender  Extremities: 1+ lower extremity edema present. DP and PT pulses palpable bilaterally Neuro: Responds to voice, communicates with hand gestures. Moves all 4 extremities. GU: Due to void Foley catheter placed  Resolved Hospital Problem list     Assessment & Plan:  Acute on chronic hypoxic and hypercarbic respiratory failure in the setting of what appears to be pulmonary edema likely due to acute diastolic heart failure, all complicated by underlying severe OHS and OSA as well as  COPD -He has been fairly compliant and motivated from a pulmonary standpoint, certainly his underlying lung disease and body habitus contributing  -Echo with no evidence of RV strain. LE dopplers with no evidence of DVT. Given these findings, concern for PE is very low.  -CXR shows mild interval improvement. Per radiology report, pattern of consolidations is consistent with multifocal pneumonia, although patient is afebrile and white count is down-trending. Will follow-up pro-calcitonin, trend white count, and repeat CXR tomorrow.  -Procalcitonin today is 8.16, up from 8.04. We will start antibiotics today, covering for CAP (gram-positives and atypical organisms) with azithromycin and ceftriaxone x 5 days.  Plan -Obtain sputum culture  today for possible pneumonia, f/u results -Continue full ventilatory support, PEEP 16 mm Hg, RR 30, tidal volume 400 mL -Wean PEEP and FiO2 as able.  -PEEP titrated for saturation goal greater than 88 -PAD protocol with RASS goal -2 -Net negative 1.1 L in past 24 hours. Continue to IV diurese with Lasix 40 mg q12. -Scheduled bronchodilators -Continue to hold antihypertensives in setting of soft BPs -F/u sputum culture -VAP bundle  Hypokalemia K 3.0 this morning, down from 3.8.  Plan -Replaced per protocol -continue to monitor  Acute diastolic heart failure with pulmonary edema.He has known history of HFpEF with last recorded EF 55 to 60% he also has history of prior RV dilation Plan -Continue Lasix to 40 mg IV q12 in setting of AKI -IV beta-blockade for tachycardia as this will exacerbate diastolic dysfunction -Tele  Severe lactic acidosis .  This is not sepsis.  Almost certainly a consequence of his increase respiratory failure Lactate now wnl at 1.5.  Plan -Supportive care  Acute metabolic encephalopathy secondary to hypercarbia and hypoxia Plan -Supportive care -PAD protocol -Continue home Zoloft  CKD stage II/III with AKI Cr 1.90 this morning, down from 2.34. Plan to continue Lasix at current dose. Plan -IV Lasix decrease to 40 mg q12 -Renal dose medications -Continues to have BPs in the 88C-166A systolic. We will continue to hold antihypertensive. -Foley  -Strict I/O's -Trend Cr with daily BMPs  Diabetes with severe hyperglycemia Plan - IV insulin sliding scale - Beta hydroxybutyric acid 0.28  Mild leukocytosis  WBC 10.6, down  trending from 15.2. Remains afebrile. Low concern for infection. Plan -trend cbc   Best practice:  Diet: NPO Pain/Anxiety/Delirium protocol (if indicated): ordered VAP protocol (if indicated): per protocol DVT prophylaxis: IV heparin  GI prophylaxis: PPI Glucose control: insulin gtt Mobility: BR Code Status: full code  Family  Communication: Sister updated 7/12 Disposition:  ICU admit  Labs   CBC: Recent Labs  Lab 07/30/19 1718 07/30/19 1718 07/30/19 1724 07/30/19 1741 07/31/19 0516 07/31/19 0520 08/01/19 0528  WBC 14.4*  --   --   --   --  15.2* 10.6*  NEUTROABS 7.4  --   --   --   --   --   --   HGB 14.6  17.0   < > 17.0 16.0 16.0 14.8 13.0  HCT 47.1  50.0   < > 50.0 47.0 47.0 47.7 41.2  MCV 90.6  --   --   --   --  89.2 88.6  PLT 295  --   --   --   --  202 178   < > = values in this interval not displayed.    Basic Metabolic Panel: Recent Labs  Lab 07/30/19 1718 07/30/19 2155 07/30/19 2258 07/30/19 2258 07/31/19 0212 07/31/19 0516 07/31/19 0520 07/31/19 0946 07/31/19 1119 07/31/19 1800 08/01/19 0528  NA   < >  --  136   < > 137 141 141 140  --   --  142  K   < >  --  4.0   < > 4.1 3.9 3.9 3.8  --   --  3.0*  CL   < >  --  95*  --  97*  --  99 99  --   --  96*  CO2   < >  --  25  --  28  --  29 30  --   --  34*  GLUCOSE   < >  --  666*  --  321*  --  216* 174*  --   --  196*  BUN   < >  --  29*  --  32*  --  33* 36*  --   --  53*  CREATININE   < >  --  2.01*  --  2.21*  --  2.34* 2.35*  --   --  1.90*  CALCIUM   < >  --  8.5*  --  8.9  --  8.7* 8.5*  --   --  8.8*  MG  --  1.4*  --   --   --   --  1.6*  --  1.6* 2.0 2.1  PHOS  --   --   --   --   --   --  3.4  --  3.7 3.7 3.4   < > = values in this interval not displayed.   GFR: Estimated Creatinine Clearance: 63.7 mL/min (A) (by C-G formula based on SCr of 1.9 mg/dL (H)). Recent Labs  Lab 07/30/19 1718 07/30/19 2155 07/30/19 2156 07/31/19 0520 07/31/19 1759 07/31/19 1800 08/01/19 0528  PROCALCITON  --  3.64  --   --   --  8.04  --   WBC 14.4*  --   --  15.2*  --   --  10.6*  LATICACIDVEN 6.0*  --  4.5*  --  1.7  --   --     Liver Function Tests: No results for input(s): AST, ALT, ALKPHOS, BILITOT, PROT, ALBUMIN  in the last 168 hours. Recent Labs  Lab 07/30/19 2155  LIPASE 27   No results for input(s): AMMONIA  in the last 168 hours.  ABG    Component Value Date/Time   PHART 7.340 (L) 07/31/2019 0516   PCO2ART 57.1 (H) 07/31/2019 0516   PO2ART 172 (H) 07/31/2019 0516   HCO3 30.6 (H) 07/31/2019 0516   TCO2 32 07/31/2019 0516   ACIDBASEDEF 1.0 07/30/2019 1724   O2SAT 99.0 07/31/2019 0516     Coagulation Profile: No results for input(s): INR, PROTIME in the last 168 hours.  Cardiac Enzymes: No results for input(s): CKTOTAL, CKMB, CKMBINDEX, TROPONINI in the last 168 hours.  HbA1C: Hgb A1c MFr Bld  Date/Time Value Ref Range Status  03/04/2019 07:08 AM 16.6 (H) 4.8 - 5.6 % Final    Comment:    (NOTE) Pre diabetes:          5.7%-6.4% Diabetes:              >6.4% Glycemic control for   <7.0% adults with diabetes   03/03/2019 01:55 PM >15.5 (H) 4.8 - 5.6 % Final    Comment:    (NOTE) **Verified by repeat analysis**         Prediabetes: 5.7 - 6.4         Diabetes: >6.4         Glycemic control for adults with diabetes: <7.0     CBG: Recent Labs  Lab 08/01/19 0256 08/01/19 0406 08/01/19 0530 08/01/19 0720 08/01/19 0813  GLUCAP 183* 199* 176* 200* 195*    Review of Systems:   Not able   Past Medical History  He,  has a past medical history of Asthma, Chronic respiratory failure with hypoxia ([place]) (07/09/2017), COPD (chronic obstructive pulmonary disease) ([place]), Diabetes mellitus without complication ([place]), Difficult intubation, Hypertension, Shortness of breath dyspnea, and Sleep apnea.   Surgical History    Past Surgical History:  Procedure Laterality Date  . IR GASTROSTOMY TUBE MOD SED  07/05/2017  . TRACHEOSTOMY TUBE PLACEMENT N/A 06/17/2017   Procedure: TRACHEOSTOMY;  Surgeon: Leta Baptist, MD;  Location: [facility] OR;  Service: ENT;  Laterality: N/A;     Social History   reports that he quit smoking about 6 years ago. His smoking use included cigarettes. He started smoking about 36 years ago. He has a 0.25 pack-year smoking history. He has never used smokeless tobacco. He  reports that he does not drink alcohol and does not use drugs.   Family History   His family history includes Asthma in his sister and another family member; Diabetes type II in his sister; Hypertension in his sister.   Allergies No Known Allergies   Home Medications  Prior to Admission medications   Medication Sig Start Date End Date Taking? Authorizing Provider  allopurinol (ZYLOPRIM) 100 MG tablet Take 100 mg by mouth daily.    [provider]  atorvastatin (LIPITOR) 10 MG tablet Take 10 mg by mouth daily.    [provider]  budesonide-formoterol (SYMBICORT) 160-4.5 MCG/ACT inhaler Inhale 2 puffs then rinse mouth, twice daiy Patient taking differently: Inhale 1 puff into the lungs 2 (two) times daily. Rinse mouth after use 01/24/19   Young, Tarri Fuller D, MD  desonide (DESOWEN) 0.05 % lotion Apply 1 application topically daily. Apply to face    [provider]  insulin aspart (NOVOLOG FLEXPEN) 100 UNIT/ML FlexPen Inject 8 Units into the skin 3 (three) times daily with meals. 03/06/19   Marianna Payment, MD  insulin glargine (LANTUS) 100 unit/mL SOPN Inject 0.35 mLs (35 Units total) into the skin at bedtime. 03/06/19 04/05/19  Marianna Payment, MD  Insulin Pen Needle (BD ULTRA-FINE PEN NEEDLES) 29G X 12.7MM MISC USE AS DIRECTED WITH LANTUS 12/23/17   [provider]  ketoconazole (NIZORAL) 2 % cream Apply 1 application topically daily. Apply to wounds    [provider]  metFORMIN (GLUCOPHAGE) 1000 MG tablet Take 1 tablet (1,000 mg total) by mouth 2 (two) times daily with a meal. 03/06/19 03/05/20  Chundi, Verne Spurr, MD  metolazone (ZAROXOLYN) 2.5 MG tablet Take 2.5 mg by mouth daily.    [provider]  potassium chloride SA (K-DUR,KLOR-CON) 20 MEQ tablet Take 1 tablet (20 mEq total) by mouth daily. 07/29/17   Angiulli, Lavon Paganini, PA-C  sertraline (ZOLOFT) 100 MG tablet Take 100 mg by mouth daily.     [provider]  torsemide (DEMADEX) 5 MG tablet  Take 5 mg by mouth daily. 01/17/19   [provider]     Critical care time:       Riccardo Dubin, MS4

## 2019-08-01 NOTE — Progress Notes (Addendum)
eLink Physician-Brief Progress Note Patient Name: Timothy Le DOB: July 10, 1960 MRN: [id number]   Date of Service  08/01/2019  HPI/Events of Note  Increasing PAC's.   eICU Interventions  Plan: 1. BMP and Mg++ level STAT.     Intervention Category Major Interventions: Arrhythmia - evaluation and management  Miran Kautzman Cornelia Copa 08/01/2019, 9:50 PM

## 2019-08-01 NOTE — Progress Notes (Signed)
Pharmacy Electrolyte Replacement  Recent Labs:  Recent Labs    08/01/19 0528  K 3.0*  MG 2.1  PHOS 3.4  CREATININE 1.90*    Low Critical Values (K </= 2.5, Phos </= 1, Mg </= 1) Present: None  MD Contacted: None  Plan:  KCl 10 mEq x 4 KCl PO 20 meq q4h x 2  Barth Kirks, PharmD, BCPS, BCCCP Clinical Pharmacist [phone]  Please check AMION for all [facility] numbers  08/01/2019 8:50 AM

## 2019-08-01 NOTE — Progress Notes (Signed)
[facility] Progress Note Patient Name: Timothy Le DOB: 1960/02/27 MRN: [id number]   Date of Service  08/01/2019  HPI/Events of Note  K+ = 3.0 and Creatinine = 1.74.   eICU Interventions  Will replace K+.     Intervention Category Major Interventions: Electrolyte abnormality - evaluation and management  Kahla Risdon [place] 08/01/2019, 11:42 PM

## 2019-08-02 ENCOUNTER — Inpatient Hospital Stay (HOSPITAL_COMMUNITY): Payer: Medicaid Other

## 2019-08-02 ENCOUNTER — Other Ambulatory Visit: Payer: Self-pay

## 2019-08-02 LAB — CBC WITH DIFFERENTIAL/PLATELET
Abs Immature Granulocytes: 0.07 10*3/uL (ref 0.00–0.07)
Basophils Absolute: 0 10*3/uL (ref 0.0–0.1)
Basophils Relative: 0 %
Eosinophils Absolute: 0.1 10*3/uL (ref 0.0–0.5)
Eosinophils Relative: 1 %
HCT: 38.2 % — ABNORMAL LOW (ref 39.0–52.0)
Hemoglobin: 12 g/dL — ABNORMAL LOW (ref 13.0–17.0)
Immature Granulocytes: 1 %
Lymphocytes Relative: 19 %
Lymphs Abs: 1.8 10*3/uL (ref 0.7–4.0)
MCH: 28.6 pg (ref 26.0–34.0)
MCHC: 31.4 g/dL (ref 30.0–36.0)
MCV: 91 fL (ref 80.0–100.0)
Monocytes Absolute: 0.7 10*3/uL (ref 0.1–1.0)
Monocytes Relative: 8 %
Neutro Abs: 7 10*3/uL (ref 1.7–7.7)
Neutrophils Relative %: 71 %
Platelets: 161 10*3/uL (ref 150–400)
RBC: 4.2 MIL/uL — ABNORMAL LOW (ref 4.22–5.81)
RDW: 16 % — ABNORMAL HIGH (ref 11.5–15.5)
WBC: 9.7 10*3/uL (ref 4.0–10.5)
nRBC: 0 % (ref 0.0–0.2)

## 2019-08-02 LAB — BASIC METABOLIC PANEL
Anion gap: 12 (ref 5–15)
BUN: 68 mg/dL — ABNORMAL HIGH (ref 6–20)
CO2: 35 mmol/L — ABNORMAL HIGH (ref 22–32)
Calcium: 8.2 mg/dL — ABNORMAL LOW (ref 8.9–10.3)
Chloride: 97 mmol/L — ABNORMAL LOW (ref 98–111)
Creatinine, Ser: 1.59 mg/dL — ABNORMAL HIGH (ref 0.61–1.24)
GFR calc Af Amer: 55 mL/min — ABNORMAL LOW (ref >60)
GFR calc non Af Amer: 47 mL/min — ABNORMAL LOW (ref >60)
Glucose, Bld: 186 mg/dL — ABNORMAL HIGH (ref 70–99)
Potassium: 3.1 mmol/L — ABNORMAL LOW (ref 3.5–5.1)
Sodium: 144 mmol/L (ref 135–145)

## 2019-08-02 LAB — PHOSPHORUS
Phosphorus: 3.2 mg/dL (ref 2.5–4.6)
Phosphorus: 3.2 mg/dL (ref 2.5–4.6)

## 2019-08-02 LAB — GLUCOSE, CAPILLARY
Glucose-Capillary: 144 mg/dL — ABNORMAL HIGH (ref 70–99)
Glucose-Capillary: 170 mg/dL — ABNORMAL HIGH (ref 70–99)
Glucose-Capillary: 175 mg/dL — ABNORMAL HIGH (ref 70–99)
Glucose-Capillary: 177 mg/dL — ABNORMAL HIGH (ref 70–99)
Glucose-Capillary: 181 mg/dL — ABNORMAL HIGH (ref 70–99)
Glucose-Capillary: 182 mg/dL — ABNORMAL HIGH (ref 70–99)
Glucose-Capillary: 188 mg/dL — ABNORMAL HIGH (ref 70–99)
Glucose-Capillary: 196 mg/dL — ABNORMAL HIGH (ref 70–99)
Glucose-Capillary: 200 mg/dL — ABNORMAL HIGH (ref 70–99)
Glucose-Capillary: 202 mg/dL — ABNORMAL HIGH (ref 70–99)
Glucose-Capillary: 223 mg/dL — ABNORMAL HIGH (ref 70–99)
Glucose-Capillary: 244 mg/dL — ABNORMAL HIGH (ref 70–99)
Glucose-Capillary: 279 mg/dL — ABNORMAL HIGH (ref 70–99)

## 2019-08-02 LAB — PROCALCITONIN: Procalcitonin: 5.82 ng/mL

## 2019-08-02 LAB — MAGNESIUM
Magnesium: 2.1 mg/dL (ref 1.7–2.4)
Magnesium: 2.3 mg/dL (ref 1.7–2.4)

## 2019-08-02 LAB — POTASSIUM: Potassium: 3.2 mmol/L — ABNORMAL LOW (ref 3.5–5.1)

## 2019-08-02 LAB — TRIGLYCERIDES: Triglycerides: 224 mg/dL — ABNORMAL HIGH (ref <150)

## 2019-08-02 LAB — BRAIN NATRIURETIC PEPTIDE: B Natriuretic Peptide: 98.8 pg/mL (ref 0.0–100.0)

## 2019-08-02 MED ORDER — INSULIN DETEMIR 100 UNIT/ML ~~LOC~~ SOLN
12.0000 [IU] | Freq: Two times a day (BID) | SUBCUTANEOUS | Status: DC
  Filled 2019-08-02 (×2): qty 0.12

## 2019-08-02 MED ORDER — INSULIN ASPART 100 UNIT/ML ~~LOC~~ SOLN
14.0000 [IU] | SUBCUTANEOUS | Status: DC
  Administered 2019-08-02 – 2019-08-03 (×4): 14 [IU] via SUBCUTANEOUS

## 2019-08-02 MED ORDER — DEXTROSE 10 % IV SOLN: INTRAVENOUS | Status: DC | PRN

## 2019-08-02 MED ORDER — ENOXAPARIN SODIUM 80 MG/0.8ML ~~LOC~~ SOLN
0.5000 mg/kg | SUBCUTANEOUS | Status: DC
  Administered 2019-08-02 – 2019-08-28 (×27): 75 mg via SUBCUTANEOUS
  Filled 2019-08-02 (×27): qty 0.8

## 2019-08-02 MED ORDER — INSULIN DETEMIR 100 UNIT/ML ~~LOC~~ SOLN
50.0000 [IU] | Freq: Two times a day (BID) | SUBCUTANEOUS | Status: DC
  Administered 2019-08-02 – 2019-08-03 (×3): 50 [IU] via SUBCUTANEOUS
  Filled 2019-08-02 (×4): qty 0.5

## 2019-08-02 MED ORDER — INSULIN ASPART 100 UNIT/ML ~~LOC~~ SOLN: 4.0000 [IU] | SUBCUTANEOUS | Status: DC

## 2019-08-02 MED ORDER — INSULIN REGULAR(HUMAN) IN NACL 100-0.9 UT/100ML-% IV SOLN
INTRAVENOUS | Status: DC
  Administered 2019-08-03: 7 [IU]/h via INTRAVENOUS
  Filled 2019-08-02: qty 100

## 2019-08-02 MED ORDER — POTASSIUM CHLORIDE 20 MEQ/15ML (10%) PO SOLN
40.0000 meq | ORAL | Status: AC
  Administered 2019-08-02 (×2): 40 meq
  Filled 2019-08-02 (×2): qty 30

## 2019-08-02 MED ORDER — INSULIN ASPART 100 UNIT/ML ~~LOC~~ SOLN
3.0000 [IU] | SUBCUTANEOUS | Status: DC
  Administered 2019-08-02 (×2): 9 [IU] via SUBCUTANEOUS
  Administered 2019-08-02 – 2019-08-03 (×2): 6 [IU] via SUBCUTANEOUS

## 2019-08-02 MED ORDER — POTASSIUM CHLORIDE 10 MEQ/50ML IV SOLN
10.0000 meq | INTRAVENOUS | Status: AC
  Administered 2019-08-02 (×4): 10 meq via INTRAVENOUS
  Filled 2019-08-02 (×4): qty 50

## 2019-08-02 NOTE — Progress Notes (Signed)
NAME:  Timothy Le, MRN:  [id number], DOB:  [birth year], LOS: 3 ADMISSION DATE:  07/30/2019, CONSULTATION DATE:  7/11 REFERRING MD:  Mariane Masters, CHIEF COMPLAINT:  Acute respiratory failure    Brief History   58 year old black male with baseline chronic respiratory failure on the basis of severe OHS/OSA complicated further by significant obstructive disease in the setting of COPD.  Presents with acute on chronic hypoxic and hypercarbic respiratory failure.  Admitted with the initial working diagnosis of pulmonary edema and decompensated diastolic heart failure. Per family patient was without CPAP for 2 weeks PTA as his machine broke.  History of present illness   59 year old male patient known to pulmonary division followed for chronic respiratory failure on the basis of severe COPD, as well as significant restrictive disease by obesity hypoventilation and obstructive sleep apnea.  He is managed on BiPAP, after successfully having significant weight loss which allowed him to have tracheostomy removed following successful titration study.  He has been followed by Dr. Annamaria Boots in the outpatient setting last seen in May.  And had been doing well followed by a nutritionist and walking daily. Presents to the emergency room via EMS today on 7/11.  Apparently had been taking garbage out, on return to the house was acutely short of breath with significant wheezing.  He tried bronchodilator, also placed himself on his home BiPAP device without improvement EMS was therefore called fire arrived first finding him minimally responsive he was manually assisted ventilation with bag valve mask, given albuterol nebulizer and transferred to the emergency room.  He was unresponsive on presentation and was intubated for airway protection, marked hypoxia, and presumptive hypercarbia.  After airway was stabilized The following initial diagnostic data was identified: White blood cell count 14.4, initial venous blood gas PCO2 64,  bicarbonate 28, pH 7.25, follow-up intubation arterial blood gas pH 7.22, PCO2 74, PO2 40 bicarbonate 30, pulse oximetry 63%.  Portable chest x-ray with diffuse pulmonary edema endotracheal tube in satisfactory position.  On i-STAT chemistry creatinine 1.5 up from baseline of 1.35-1.43 glucose 652 Critical care was asked to admit  Past Medical History  History of OHS, OSA, diastolic heart failure, chronic respiratory failure on this basis.  Also has a history of prior tracheostomy this was removed after significant weight loss and successful BiPAP titration study.  Morbid obesity, diabetes type 2, anemia, COPD with significant obstructive disease and slight response to bronchodilators FEV1 1.77/59% predicted prior smoker  Significant Hospital Events   7/11 and admitted: Intubated.  Requiring high PEEP for refractory hypoxia, central line placed, IV diuresis administered   Consults:    Procedures:  Endotracheal tube 7/11 Right IJ triple-lumen catheter 7/11  Significant Diagnostic Tests:  Echocardiogram 7/11 >> no evidence of RV strain Lower extremity ultrasound 7/11 >> no evidence of lower extremity DVT CXR 7/13 >> interval improvement; airspace disease consistent with multifocal pneumonia per radiology report Micro Data:  Sputum culture 7/11 >> Blood culture 7/11 >>  Antimicrobials:  None  Interim history/subjective:  No acute events overnight. Patient requiring with propofol and fentanyl infusions.   Objective   Blood pressure (!) 105/59, pulse 67, temperature 100 F (37.8 C), resp. rate (!) 30, height 6' (1.829 m), weight (!) 148.9 kg, SpO2 96 %. CVP:  [8 mmHg] 8 mmHg  Vent Mode: PRVC FiO2 (%):  [60 %] 60 % Set Rate:  [30 bmp] 30 bmp Vt Set:  [400 mL] 400 mL PEEP:  [12 cmH20-16 cmH20] 12 cmH20 Plateau Pressure:  [22  cmH20-29 cmH20] 23 cmH20   Intake/Output Summary (Last 24 hours) at 08/02/2019 0718 Last data filed at 08/02/2019 0700 Gross per 24 hour  Intake 3825.61 ml    Output 3305 ml  Net 520.61 ml   Filed Weights   07/31/19 0500 08/01/19 0206 08/02/19 0412  Weight: (!) 145.2 kg (!) 146.1 kg (!) 148.9 kg   Examination: General: Obese 59 year old black male intubated and sedated on propofol HEENT: normocephalic atraumatic neck is large no clear JVD mucous membranes moist, old healed tracheostomy site midline Lungs: Mechanical breath sounds throughout, diminished in the bases no accessory use currently FiO2 100/PEEP 16. SpO2 currently in mid 90s.  Cardiovascular: Distant heart sounds. Regular rate and rhythm Abdomen: Obese abdomen with positive bowel sounds, non-tender  Extremities: 1+ lower extremity edema present. DP and PT pulses palpable bilaterally Neuro: Responds to voice, communicates with hand gestures. Moves all 4 extremities. GU: Due to void Foley catheter placed  Resolved Hospital Problem list   Lactic acidosis Mild leukocytosis  Assessment & Plan:  Acute on chronic hypoxic and hypercarbic respiratory failure in the setting of what appears to be pulmonary edema likely due to acute diastolic heart failure, all complicated by underlying severe OHS and OSA as well as  COPD -He has been fairly compliant and motivated from a pulmonary standpoint, certainly his underlying lung disease and body habitus contributing  -Procalcitonin down-trending to 5.82 from 8.16.  -Tracheal aspirate with rare WBCs (PMNs and mononuclear) and rare gram positive cocci in pairs. Plan to complete course of azithromycin/CTX. Plan -F/u repeat CXR -Continue azithromycin/CTX x 5 days -F/u sputum culture results -Wean PEEP and FiO2 as able. This AM, PEEP at 12 mm Hg and Fi02 60%. Given his size and history of OHS/OSA, we will aim for PEEP 8-10 mm Hg and FiO2 40% prior to a trial of spontaneous breathing. -PEEP titrated for SpO2 > 88 -PAD protocol with RASS goal -2 -Strict I/O's. Net negative +520 mL in past 24 hr (made 3.3 L urine) -Scheduled bronchodilators -Continue  to hold antihypertensives in setting of soft BPs -VAP bundle  Hypokalemia K 3.1 this morning. Plan -Replace per protocol -continue to monitor   Acute diastolic heart failure with pulmonary edema.He has known history of HFpEF with last recorded EF 55 to 60% he also has history of prior RV dilation Plan -Continue Lasix to 40 mg IV q12  -Continue Metolazone 2.5 mg daily -PRN IV beta-blockade for HR > 110 -Tele  Acute metabolic encephalopathy secondary to hypercarbia and hypoxia Plan -Supportive care -PAD protocol -Continue home Zoloft  CKD stage II/III with AKI Cr 1.59 this AM, down from 1.74.  Plan -IV Lasix decrease to 40 mg q12 -Renal dose medications -Continues to have BPs in the 94M-768G systolic. We will continue to hold antihypertensive. -Foley   -Strict I/O's -Trend Cr with daily BMPs  Diabetes with severe hyperglycemia Persistent hyperglycemia. Most recent glucose 177  Plan - IV insulin sliding scale   Best practice:  Diet: NPO Pain/Anxiety/Delirium protocol (if indicated): ordered VAP protocol (if indicated): per protocol DVT prophylaxis: IV heparin  GI prophylaxis: PPI Glucose control: insulin gtt Mobility: BR Code Status: full code  Family Communication: Sister updated 7/12 Disposition:  ICU admit  Labs   CBC: Recent Labs  Lab 07/30/19 1718 07/30/19 1724 07/30/19 1741 07/31/19 0516 07/31/19 0520 08/01/19 0528 08/02/19 0421  WBC 14.4*  --   --   --  15.2* 10.6* 9.7  NEUTROABS 7.4  --   --   --   --   --  7.0  HGB 14.6   17.0   < > 16.0 16.0 14.8 13.0 12.0*  HCT 47.1   50.0   < > 47.0 47.0 47.7 41.2 38.2*  MCV 90.6  --   --   --  89.2 88.6 91.0  PLT 295  --   --   --  202 178 161   < > = values in this interval not displayed.    Basic Metabolic Panel: Recent Labs  Lab 07/31/19 0520 07/31/19 0520 07/31/19 0946 07/31/19 1119 07/31/19 1800 08/01/19 0528 08/01/19 2202 08/02/19 0355 08/02/19 0421  NA 141  --  140  --   --  142 141   --  144  K 3.9  --  3.8  --   --  3.0* 3.0*  --  3.1*  CL 99  --  99  --   --  96* 94*  --  97*  CO2 29  --  30  --   --  34* 36*  --  35*  GLUCOSE 216*  --  174*  --   --  196* 272*  --  186*  BUN 33*  --  36*  --   --  53* 63*  --  68*  CREATININE 2.34*  --  2.35*  --   --  1.90* 1.74*  --  1.59*  CALCIUM 8.7*  --  8.5*  --   --  8.8* 8.2*  --  8.2*  MG 1.6*   < >  --  1.6* 2.0 2.1 2.1 2.1  --   PHOS 3.4  --   --  3.7 3.7 3.4  --  3.2  --    < > = values in this interval not displayed.   GFR: Estimated Creatinine Clearance: 76.9 mL/min (A) (by C-G formula based on SCr of 1.59 mg/dL (H)). Recent Labs  Lab 07/30/19 1718 07/30/19 2155 07/30/19 2156 07/31/19 0520 07/31/19 1759 07/31/19 1800 08/01/19 0528 08/02/19 0355 08/02/19 0421  PROCALCITON  --  3.64  --   --   --  8.04 8.16 5.82  --   WBC 14.4*  --   --  15.2*  --   --  10.6*  --  9.7  LATICACIDVEN 6.0*  --  4.5*  --  1.7  --   --   --   --     Liver Function Tests: No results for input(s): AST, ALT, ALKPHOS, BILITOT, PROT, ALBUMIN in the last 168 hours. Recent Labs  Lab 07/30/19 2155  LIPASE 27   No results for input(s): AMMONIA in the last 168 hours.  ABG    Component Value Date/Time   PHART 7.340 (L) 07/31/2019 0516   PCO2ART 57.1 (H) 07/31/2019 0516   PO2ART 172 (H) 07/31/2019 0516   HCO3 30.6 (H) 07/31/2019 0516   TCO2 32 07/31/2019 0516   ACIDBASEDEF 1.0 07/30/2019 1724   O2SAT 99.0 07/31/2019 0516     Coagulation Profile: No results for input(s): INR, PROTIME in the last 168 hours.  Cardiac Enzymes: No results for input(s): CKTOTAL, CKMB, CKMBINDEX, TROPONINI in the last 168 hours.  HbA1C: Hgb A1c MFr Bld  Date/Time Value Ref Range Status  03/04/2019 07:08 AM 16.6 (H) 4.8 - 5.6 % Final    Comment:    (NOTE) Pre diabetes:          5.7%-6.4% Diabetes:              >6.4% Glycemic control for   <7.0%  adults with diabetes   03/03/2019 01:55 PM >15.5 (H) 4.8 - 5.6 % Final    Comment:     (NOTE) **Verified by repeat analysis**         Prediabetes: 5.7 - 6.4         Diabetes: >6.4         Glycemic control for adults with diabetes: <7.0     CBG: Recent Labs  Lab 08/02/19 0156 08/02/19 0254 08/02/19 0351 08/02/19 0512 08/02/19 0622  GLUCAP 170* 202* 196* 144* 177*    Review of Systems:   Not able   Past Medical History  He,  has a past medical history of Asthma, Chronic respiratory failure with hypoxia ([place]) (07/09/2017), COPD (chronic obstructive pulmonary disease) ([place]), Diabetes mellitus without complication ([place]), Difficult intubation, Hypertension, Shortness of breath dyspnea, and Sleep apnea.   Surgical History    Past Surgical History:  Procedure Laterality Date   IR GASTROSTOMY TUBE MOD SED  07/05/2017   TRACHEOSTOMY TUBE PLACEMENT N/A 06/17/2017   Procedure: TRACHEOSTOMY;  Surgeon: Leta Baptist, MD;  Location: [facility] OR;  Service: ENT;  Laterality: N/A;     Social History   reports that he quit smoking about 6 years ago. His smoking use included cigarettes. He started smoking about 36 years ago. He has a 0.25 pack-year smoking history. He has never used smokeless tobacco. He reports that he does not drink alcohol and does not use drugs.   Family History   His family history includes Asthma in his sister and another family member; Diabetes type II in his sister; Hypertension in his sister.   Allergies No Known Allergies   Home Medications  Prior to Admission medications   Medication Sig Start Date End Date Taking? Authorizing Provider  allopurinol (ZYLOPRIM) 100 MG tablet Take 100 mg by mouth daily.    [provider]  atorvastatin (LIPITOR) 10 MG tablet Take 10 mg by mouth daily.    [provider]  budesonide-formoterol (SYMBICORT) 160-4.5 MCG/ACT inhaler Inhale 2 puffs then rinse mouth, twice daiy Patient taking differently: Inhale 1 puff into the lungs 2 (two) times daily. Rinse mouth after use 01/24/19   Young, Tarri Fuller D, MD  desonide  (DESOWEN) 0.05 % lotion Apply 1 application topically daily. Apply to face    [provider]  insulin aspart (NOVOLOG FLEXPEN) 100 UNIT/ML FlexPen Inject 8 Units into the skin 3 (three) times daily with meals. 03/06/19   Marianna Payment, MD  insulin glargine (LANTUS) 100 unit/mL SOPN Inject 0.35 mLs (35 Units total) into the skin at bedtime. 03/06/19 04/05/19  Marianna Payment, MD  Insulin Pen Needle (BD ULTRA-FINE PEN NEEDLES) 29G X 12.7MM MISC USE AS DIRECTED WITH LANTUS 12/23/17   [provider]  ketoconazole (NIZORAL) 2 % cream Apply 1 application topically daily. Apply to wounds    [provider]  metFORMIN (GLUCOPHAGE) 1000 MG tablet Take 1 tablet (1,000 mg total) by mouth 2 (two) times daily with a meal. 03/06/19 03/05/20  Chundi, Verne Spurr, MD  metolazone (ZAROXOLYN) 2.5 MG tablet Take 2.5 mg by mouth daily.    [provider]  potassium chloride SA (K-DUR,KLOR-CON) 20 MEQ tablet Take 1 tablet (20 mEq total) by mouth daily. 07/29/17   Angiulli, Lavon Paganini, PA-C  sertraline (ZOLOFT) 100 MG tablet Take 100 mg by mouth daily.     [provider]  torsemide (DEMADEX) 5 MG tablet Take 5 mg by mouth daily. 01/17/19   [provider]  Critical care time:       Riccardo Dubin, MS4

## 2019-08-02 NOTE — Progress Notes (Signed)
eLink Physician-Brief Progress Note Patient Name: Timothy Le DOB: 04-Sep-1960 MRN: [id number]   Date of Service  08/02/2019  HPI/Events of Note  Blood glucose = 144 --> 177 both on insulin IV infusion at 2.8 units/hour.  eICU Interventions  Will transition to Levemir + Q 4 hour resistant Novolog SSI ( BMI = 44.52)     Intervention Category Major Interventions: Hyperglycemia - active titration of insulin therapy  Jezebel Pollet [place] 08/02/2019, 6:44 AM

## 2019-08-02 NOTE — Progress Notes (Signed)
[facility] Progress Note Patient Name: BARTLOMIEJ JENKINSON DOB: 06/24/1960 MRN: [id number]   Date of Service  08/02/2019  HPI/Events of Note  Hypokalemia - K+ = 3.1 and Creatinine = 1.59.  eICU Interventions  Will replace K+.     Intervention Category Major Interventions: Electrolyte abnormality - evaluation and management  Jailan Trimm [place] 08/02/2019, 6:15 AM

## 2019-08-02 NOTE — Progress Notes (Signed)
eLink Physician-Brief Progress Note Patient Name: Timothy Le DOB: 08-22-1960 MRN: [id number]   Date of Service  08/02/2019  HPI/Events of Note  Nursing request for BMP and CBC with platelets now.   eICU Interventions  Will order BMP and CBC with platelets now.      Intervention Category Major Interventions: Other:  Lysle Dingwall 08/02/2019, 4:20 AM

## 2019-08-02 NOTE — Progress Notes (Signed)
Notified E-link patient met transition criteria for insulin drip per End[facility].

## 2019-08-03 ENCOUNTER — Inpatient Hospital Stay (HOSPITAL_COMMUNITY): Payer: Medicaid Other

## 2019-08-03 LAB — CULTURE, RESPIRATORY W GRAM STAIN: Culture: NORMAL

## 2019-08-03 LAB — BASIC METABOLIC PANEL
Anion gap: 12 (ref 5–15)
Anion gap: 14 (ref 5–15)
BUN: 78 mg/dL — ABNORMAL HIGH (ref 6–20)
BUN: 78 mg/dL — ABNORMAL HIGH (ref 6–20)
CO2: 37 mmol/L — ABNORMAL HIGH (ref 22–32)
CO2: 33 mmol/L — ABNORMAL HIGH (ref 22–32)
Calcium: 8.6 mg/dL — ABNORMAL LOW (ref 8.9–10.3)
Calcium: 8.7 mg/dL — ABNORMAL LOW (ref 8.9–10.3)
Chloride: 98 mmol/L (ref 98–111)
Chloride: 105 mmol/L (ref 98–111)
Creatinine, Ser: 1.71 mg/dL — ABNORMAL HIGH (ref 0.61–1.24)
Creatinine, Ser: 1.56 mg/dL — ABNORMAL HIGH (ref 0.61–1.24)
GFR calc Af Amer: 50 mL/min — ABNORMAL LOW (ref >60)
GFR calc Af Amer: 56 mL/min — ABNORMAL LOW (ref >60)
GFR calc non Af Amer: 43 mL/min — ABNORMAL LOW (ref >60)
GFR calc non Af Amer: 49 mL/min — ABNORMAL LOW (ref >60)
Glucose, Bld: 195 mg/dL — ABNORMAL HIGH (ref 70–99)
Glucose, Bld: 110 mg/dL — ABNORMAL HIGH (ref 70–99)
Potassium: 3.2 mmol/L — ABNORMAL LOW (ref 3.5–5.1)
Potassium: 3.9 mmol/L (ref 3.5–5.1)
Sodium: 147 mmol/L — ABNORMAL HIGH (ref 135–145)
Sodium: 152 mmol/L — ABNORMAL HIGH (ref 135–145)

## 2019-08-03 LAB — GLUCOSE, CAPILLARY
Glucose-Capillary: 109 mg/dL — ABNORMAL HIGH (ref 70–99)
Glucose-Capillary: 122 mg/dL — ABNORMAL HIGH (ref 70–99)
Glucose-Capillary: 160 mg/dL — ABNORMAL HIGH (ref 70–99)
Glucose-Capillary: 163 mg/dL — ABNORMAL HIGH (ref 70–99)
Glucose-Capillary: 167 mg/dL — ABNORMAL HIGH (ref 70–99)
Glucose-Capillary: 173 mg/dL — ABNORMAL HIGH (ref 70–99)
Glucose-Capillary: 173 mg/dL — ABNORMAL HIGH (ref 70–99)
Glucose-Capillary: 187 mg/dL — ABNORMAL HIGH (ref 70–99)
Glucose-Capillary: 190 mg/dL — ABNORMAL HIGH (ref 70–99)
Glucose-Capillary: 199 mg/dL — ABNORMAL HIGH (ref 70–99)
Glucose-Capillary: 202 mg/dL — ABNORMAL HIGH (ref 70–99)
Glucose-Capillary: 206 mg/dL — ABNORMAL HIGH (ref 70–99)
Glucose-Capillary: 212 mg/dL — ABNORMAL HIGH (ref 70–99)
Glucose-Capillary: 219 mg/dL — ABNORMAL HIGH (ref 70–99)
Glucose-Capillary: 220 mg/dL — ABNORMAL HIGH (ref 70–99)
Glucose-Capillary: 225 mg/dL — ABNORMAL HIGH (ref 70–99)

## 2019-08-03 LAB — BRAIN NATRIURETIC PEPTIDE: B Natriuretic Peptide: 90.9 pg/mL (ref 0.0–100.0)

## 2019-08-03 LAB — PHOSPHORUS
Phosphorus: 2.4 mg/dL — ABNORMAL LOW (ref 2.5–4.6)
Phosphorus: 4 mg/dL (ref 2.5–4.6)

## 2019-08-03 LAB — HEMOGLOBIN A1C
Hgb A1c MFr Bld: 14.9 % — ABNORMAL HIGH (ref 4.8–5.6)
Mean Plasma Glucose: 380.93 mg/dL

## 2019-08-03 LAB — PROCALCITONIN: Procalcitonin: 4.21 ng/mL

## 2019-08-03 LAB — MAGNESIUM
Magnesium: 2.2 mg/dL (ref 1.7–2.4)
Magnesium: 2.5 mg/dL — ABNORMAL HIGH (ref 1.7–2.4)

## 2019-08-03 LAB — TRIGLYCERIDES: Triglycerides: 209 mg/dL — ABNORMAL HIGH (ref <150)

## 2019-08-03 MED ORDER — SODIUM CHLORIDE 0.9 % IV SOLN: INTRAVENOUS | Status: DC

## 2019-08-03 MED ORDER — INSULIN ASPART 100 UNIT/ML ~~LOC~~ SOLN
18.0000 [IU] | SUBCUTANEOUS | Status: DC
  Administered 2019-08-03 – 2019-08-08 (×26): 18 [IU] via SUBCUTANEOUS

## 2019-08-03 MED ORDER — INSULIN ASPART 100 UNIT/ML ~~LOC~~ SOLN: 18.0000 [IU] | SUBCUTANEOUS | Status: DC

## 2019-08-03 MED ORDER — INSULIN REGULAR(HUMAN) IN NACL 100-0.9 UT/100ML-% IV SOLN: INTRAVENOUS | Status: DC

## 2019-08-03 MED ORDER — POTASSIUM CHLORIDE 10 MEQ/50ML IV SOLN
10.0000 meq | INTRAVENOUS | Status: DC
  Administered 2019-08-03 (×3): 10 meq via INTRAVENOUS
  Filled 2019-08-03 (×4): qty 50

## 2019-08-03 MED ORDER — MIDAZOLAM HCL 2 MG/2ML IJ SOLN
1.0000 mg | INTRAMUSCULAR | Status: AC | PRN
  Administered 2019-08-03 – 2019-08-04 (×3): 1 mg via INTRAVENOUS
  Filled 2019-08-03 (×3): qty 2

## 2019-08-03 MED ORDER — POTASSIUM CHLORIDE 20 MEQ/15ML (10%) PO SOLN
40.0000 meq | ORAL | Status: AC
  Administered 2019-08-03 (×2): 40 meq
  Filled 2019-08-03 (×2): qty 30

## 2019-08-03 MED ORDER — DEXTROSE 50 % IV SOLN: 0.0000 mL | INTRAVENOUS | Status: DC | PRN

## 2019-08-03 MED ORDER — POTASSIUM CHLORIDE 20 MEQ/15ML (10%) PO SOLN
ORAL | Status: AC
  Administered 2019-08-03: 40 meq
  Filled 2019-08-03: qty 30

## 2019-08-03 MED ORDER — INSULIN ASPART 100 UNIT/ML ~~LOC~~ SOLN
0.0000 [IU] | SUBCUTANEOUS | Status: DC
  Administered 2019-08-03: 4 [IU] via SUBCUTANEOUS
  Administered 2019-08-03: 3 [IU] via SUBCUTANEOUS
  Administered 2019-08-04: 7 [IU] via SUBCUTANEOUS
  Administered 2019-08-04: 4 [IU] via SUBCUTANEOUS
  Administered 2019-08-04 (×2): 3 [IU] via SUBCUTANEOUS
  Administered 2019-08-05 (×3): 4 [IU] via SUBCUTANEOUS
  Administered 2019-08-05: 7 [IU] via SUBCUTANEOUS
  Administered 2019-08-05: 3 [IU] via SUBCUTANEOUS
  Administered 2019-08-05 – 2019-08-06 (×5): 4 [IU] via SUBCUTANEOUS
  Administered 2019-08-06 – 2019-08-07 (×2): 3 [IU] via SUBCUTANEOUS
  Administered 2019-08-07: 4 [IU] via SUBCUTANEOUS
  Administered 2019-08-07 (×4): 3 [IU] via SUBCUTANEOUS
  Administered 2019-08-08: 4 [IU] via SUBCUTANEOUS
  Administered 2019-08-08 (×2): 3 [IU] via SUBCUTANEOUS
  Administered 2019-08-09 – 2019-08-10 (×7): 4 [IU] via SUBCUTANEOUS
  Administered 2019-08-10: 7 [IU] via SUBCUTANEOUS
  Administered 2019-08-10: 4 [IU] via SUBCUTANEOUS
  Administered 2019-08-10: 3 [IU] via SUBCUTANEOUS
  Administered 2019-08-10: 7 [IU] via SUBCUTANEOUS
  Administered 2019-08-10: 3 [IU] via SUBCUTANEOUS
  Administered 2019-08-11 – 2019-08-12 (×6): 4 [IU] via SUBCUTANEOUS
  Administered 2019-08-12 (×2): 3 [IU] via SUBCUTANEOUS
  Administered 2019-08-12: 7 [IU] via SUBCUTANEOUS
  Administered 2019-08-12: 3 [IU] via SUBCUTANEOUS
  Administered 2019-08-12: 4 [IU] via SUBCUTANEOUS
  Administered 2019-08-13: 3 [IU] via SUBCUTANEOUS
  Administered 2019-08-13 (×4): 4 [IU] via SUBCUTANEOUS
  Administered 2019-08-13 – 2019-08-14 (×3): 3 [IU] via SUBCUTANEOUS
  Administered 2019-08-14 – 2019-08-15 (×2): 4 [IU] via SUBCUTANEOUS
  Administered 2019-08-15 (×3): 3 [IU] via SUBCUTANEOUS
  Administered 2019-08-15 (×2): 4 [IU] via SUBCUTANEOUS
  Administered 2019-08-15: 3 [IU] via SUBCUTANEOUS
  Administered 2019-08-16: 11 [IU] via SUBCUTANEOUS
  Administered 2019-08-16: 15 [IU] via SUBCUTANEOUS
  Administered 2019-08-16: 4 [IU] via SUBCUTANEOUS
  Administered 2019-08-16: 15 [IU] via SUBCUTANEOUS
  Administered 2019-08-16 – 2019-08-17 (×2): 7 [IU] via SUBCUTANEOUS
  Administered 2019-08-17 (×4): 11 [IU] via SUBCUTANEOUS
  Administered 2019-08-17 (×2): 15 [IU] via SUBCUTANEOUS
  Administered 2019-08-18: 7 [IU] via SUBCUTANEOUS
  Administered 2019-08-18 (×3): 11 [IU] via SUBCUTANEOUS
  Administered 2019-08-18: 7 [IU] via SUBCUTANEOUS
  Administered 2019-08-18: 20 [IU] via SUBCUTANEOUS
  Administered 2019-08-19: 4 [IU] via SUBCUTANEOUS
  Administered 2019-08-19: 11 [IU] via SUBCUTANEOUS
  Administered 2019-08-19 (×2): 4 [IU] via SUBCUTANEOUS
  Administered 2019-08-19 – 2019-08-20 (×2): 7 [IU] via SUBCUTANEOUS
  Administered 2019-08-20 (×3): 4 [IU] via SUBCUTANEOUS
  Administered 2019-08-20: 3 [IU] via SUBCUTANEOUS
  Administered 2019-08-20: 4 [IU] via SUBCUTANEOUS
  Administered 2019-08-21: 3 [IU] via SUBCUTANEOUS
  Administered 2019-08-21 (×2): 4 [IU] via SUBCUTANEOUS
  Administered 2019-08-21 (×2): 3 [IU] via SUBCUTANEOUS
  Administered 2019-08-22: 4 [IU] via SUBCUTANEOUS
  Administered 2019-08-22 (×2): 3 [IU] via SUBCUTANEOUS
  Administered 2019-08-23: 4 [IU] via SUBCUTANEOUS
  Administered 2019-08-23 – 2019-08-24 (×3): 3 [IU] via SUBCUTANEOUS
  Administered 2019-08-24 – 2019-08-25 (×2): 4 [IU] via SUBCUTANEOUS
  Administered 2019-08-25: 3 [IU] via SUBCUTANEOUS
  Administered 2019-08-25: 4 [IU] via SUBCUTANEOUS
  Administered 2019-08-26: 3 [IU] via SUBCUTANEOUS
  Administered 2019-08-26: 4 [IU] via SUBCUTANEOUS
  Administered 2019-08-26 – 2019-08-31 (×11): 3 [IU] via SUBCUTANEOUS
  Administered 2019-08-31: 4 [IU] via SUBCUTANEOUS
  Administered 2019-08-31 – 2019-09-05 (×13): 3 [IU] via SUBCUTANEOUS
  Administered 2019-09-06: 4 [IU] via SUBCUTANEOUS
  Administered 2019-09-06 – 2019-09-08 (×6): 3 [IU] via SUBCUTANEOUS
  Administered 2019-09-08: 4 [IU] via SUBCUTANEOUS
  Administered 2019-09-09 (×4): 3 [IU] via SUBCUTANEOUS
  Administered 2019-09-10: 4 [IU] via SUBCUTANEOUS
  Administered 2019-09-10: 3 [IU] via SUBCUTANEOUS
  Administered 2019-09-11: 7 [IU] via SUBCUTANEOUS
  Administered 2019-09-11 – 2019-09-14 (×7): 3 [IU] via SUBCUTANEOUS
  Administered 2019-09-14 – 2019-09-15 (×3): 4 [IU] via SUBCUTANEOUS

## 2019-08-03 MED ORDER — DEXTROSE-NACL 5-0.45 % IV SOLN: INTRAVENOUS | Status: DC

## 2019-08-03 MED ORDER — INSULIN DETEMIR 100 UNIT/ML ~~LOC~~ SOLN
55.0000 [IU] | Freq: Two times a day (BID) | SUBCUTANEOUS | Status: DC
  Filled 2019-08-03: qty 0.55

## 2019-08-03 MED ORDER — INSULIN DETEMIR 100 UNIT/ML ~~LOC~~ SOLN
55.0000 [IU] | Freq: Two times a day (BID) | SUBCUTANEOUS | Status: DC
  Administered 2019-08-03 – 2019-08-07 (×6): 55 [IU] via SUBCUTANEOUS
  Filled 2019-08-03 (×11): qty 0.55

## 2019-08-03 MED ORDER — INSULIN REGULAR(HUMAN) IN NACL 100-0.9 UT/100ML-% IV SOLN
INTRAVENOUS | Status: DC
  Administered 2019-08-03: 8 [IU]/h via INTRAVENOUS
  Administered 2019-08-03: 8.5 [IU]/h via INTRAVENOUS
  Filled 2019-08-03: qty 100

## 2019-08-03 MED ORDER — POTASSIUM CHLORIDE CRYS ER 20 MEQ PO TBCR: 40.0000 meq | EXTENDED_RELEASE_TABLET | ORAL | Status: DC

## 2019-08-03 NOTE — Progress Notes (Signed)
eLink Physician-Brief Progress Note Patient Name: ORLEY LAWRY DOB: 11-12-1960 MRN: [id number]   Date of Service  08/03/2019  HPI/Events of Note  Agitation  eICU Interventions  Plan: 1. Increase ceiling on Fentanyl IV infusion to 300 mcg/hour     Intervention Category Major Interventions: Delirium, psychosis, severe agitation - evaluation and management  Timoty Bourke [place] 08/03/2019, 5:12 AM

## 2019-08-03 NOTE — Progress Notes (Addendum)
Inpatient Diabetes Program Recommendations  AACE/ADA: New Consensus Statement on Inpatient Glycemic Control (2015)  Target Ranges:  Prepandial:   less than 140 mg/dL      Peak postprandial:   less than 180 mg/dL (1-2 hours)      Critically ill patients:  140 - 180 mg/dL   Lab Results  Component Value Date   GLUCAP 173 (H) 08/03/2019   HGBA1C 16.6 (H) 03/04/2019    Review of Glycemic Control Results for Timothy Le, Timothy Le (MRN [id number]) as of 08/03/2019 12:56  Ref. Range 08/03/2019 07:21 08/03/2019 08:38 08/03/2019 09:46 08/03/2019 10:48 08/03/2019 11:51  Glucose-Capillary Latest Ref Range: 70 - 99 mg/dL 199 (H) 212 (H) 206 (H) 202 (H) 173 (H)   Diabetes history: DM 2 Outpatient Diabetes medications:  Lantus 35 units q HS, Novolog 8 units tid with meals Current orders for Inpatient glycemic control:  Levemir 50 units bid, Novolog 3-6-9 q 4 hours, Novolog 14 units q 4 hours  Inpatient Diabetes Program Recommendations:   Referral received.  Please consider increasing Levemir to 55 units bid.  Also consider increasing Novolog meal coverage to 18 units q 4 hours to cover tube feeds.    Thanks  Adah Perl, RN, BC-ADM Inpatient Diabetes Coordinator Pager [phone] (8a-5p)

## 2019-08-03 NOTE — Progress Notes (Signed)
Notified E-Link in regards to patient with two consecutive blood glucose readings > 200. Patient meets criteria for Phase 2 Adult ICU Glycemic Control Protocol - IV insulin. Insulin gtt started per Oletta Darter, MD.   Timothy Le, [place] 08/03/2019

## 2019-08-03 NOTE — Progress Notes (Signed)
[facility] Progress Note Patient Name: WAYMAN HOARD DOB: 1960-01-29 MRN: [id number]   Date of Service  08/03/2019  HPI/Events of Note  Hypokalemia - K+ = 3.2 and Creatinine = 1.71   eICU Interventions  Will replace K+.      Intervention Category Major Interventions: Electrolyte abnormality - evaluation and management  Banesa Tristan [place] 08/03/2019, 5:41 AM

## 2019-08-03 NOTE — Progress Notes (Signed)
[facility] Progress Note Patient Name: Timothy Le DOB: [birth year] MRN: [id number]   Date of Service  08/03/2019  HPI/Events of Note  Serum Na+ went from 147 to 152 consistent with volume depletion.  eICU Interventions  Free water flushes increased to 60 ml / hour.        Kerry Kass Noriel Guthrie 08/03/2019, 11:15 PM

## 2019-08-03 NOTE — Consult Note (Signed)
WOC Nurse Consult Note: Patient receiving care in [facility] 3M09.  Primary RN present at time of assessment. 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  NOT a pressure injury. Pressure Injury POA: Yes/No/NA Measurement: entire length of gluteal fold impacted by moisture Wound bed: red Drainage (amount, consistency, odor)  none Periwound: intact Dressing procedure/placement/frequency: Cut a sliver of Aquacel Kellie Simmering [phone]) and place deep in the gluteal fold. Cover with a foam dressing. Change daily. Monitor the wound area(s) for worsening of condition such as: Signs/symptoms of infection,  Increase in size,  Development of or worsening of odor, Development of pain, or increased pain at the affected locations.  Notify the medical team if any of these develop.  Thank you for the consult.  Discussed plan of care with the patient and bedside nurse.  [place] nurse will not follow at this time.  Please re-consult the [facility] team if needed.  Val Riles, RN, MSN, CWOCN, CNS-BC, pager [phone]

## 2019-08-03 NOTE — Progress Notes (Signed)
NAME:  Timothy Le, MRN:  [id number], DOB:  05/09/1960, LOS: 4 ADMISSION DATE:  07/30/2019, CONSULTATION DATE:  7/11 REFERRING MD:  Mariane Masters, CHIEF COMPLAINT:  Acute respiratory failure    Brief History   59 year old black male with baseline chronic respiratory failure on the basis of severe OHS/OSA complicated further by significant obstructive disease in the setting of COPD.  Presents with acute on chronic hypoxic and hypercarbic respiratory failure.  Admitted with the initial working diagnosis of pulmonary edema and decompensated diastolic heart failure. Per family patient was without CPAP for 2 weeks PTA as his machine broke.  History of present illness   59 year old male patient known to pulmonary division followed for chronic respiratory failure on the basis of severe COPD, as well as significant restrictive disease by obesity hypoventilation and obstructive sleep apnea.  He is managed on BiPAP, after successfully having significant weight loss which allowed him to have tracheostomy removed following successful titration study.  He has been followed by Dr. Annamaria Boots in the outpatient setting last seen in May.  And had been doing well followed by a nutritionist and walking daily. Presents to the emergency room via EMS today on 7/11.  Apparently had been taking garbage out, on return to the house was acutely short of breath with significant wheezing.  He tried bronchodilator, also placed himself on his home BiPAP device without improvement EMS was therefore called fire arrived first finding him minimally responsive he was manually assisted ventilation with bag valve mask, given albuterol nebulizer and transferred to the emergency room.  He was unresponsive on presentation and was intubated for airway protection, marked hypoxia, and presumptive hypercarbia.  After airway was stabilized The following initial diagnostic data was identified: White blood cell count 14.4, initial venous blood gas PCO2 64,  bicarbonate 28, pH 7.25, follow-up intubation arterial blood gas pH 7.22, PCO2 74, PO2 40 bicarbonate 30, pulse oximetry 63%.  Portable chest x-ray with diffuse pulmonary edema endotracheal tube in satisfactory position.  On i-STAT chemistry creatinine 1.5 up from baseline of 1.35-1.43 glucose 652 Critical care was asked to admit  Past Medical History  History of OHS, OSA, diastolic heart failure, chronic respiratory failure on this basis.  Also has a history of prior tracheostomy this was removed after significant weight loss and successful BiPAP titration study.  Morbid obesity, diabetes type 2, anemia, COPD with significant obstructive disease and slight response to bronchodilators FEV1 1.77/59% predicted prior smoker  Significant Hospital Events   7/11 and admitted: Intubated.  Requiring high PEEP for refractory hypoxia, central line placed, IV diuresis administered   Consults:    Procedures:  Endotracheal tube 7/11 Right IJ triple-lumen catheter 7/11  Significant Diagnostic Tests:  Echocardiogram 7/11 >> no evidence of RV strain Lower extremity ultrasound 7/11 >> no evidence of lower extremity DVT CXR 7/13 >> interval improvement; airspace disease consistent with multifocal pneumonia per radiology report Micro Data:  Sputum culture 7/11 >> Rare growth consistent with normal respiratory flora Blood culture 7/11 >>  Antimicrobials:  None  Interim history/subjective:  No acute events overnight. Still sedated with propofol and fentanyl drips. Continuing to wean vent, PEEP down to 10, FiO2 40%.    Objective   Blood pressure 108/64, pulse (!) 57, temperature 98.6 F (37 C), temperature source Oral, resp. rate (!) 30, height 6' (1.829 m), weight (!) 142.6 kg, SpO2 97 %.    Vent Mode: PRVC FiO2 (%):  [40 %-60 %] 40 % Set Rate:  [30 bmp] 30 bmp  Vt Set:  [400 mL] 400 mL PEEP:  [10 cmH20-12 cmH20] 10 cmH20 Plateau Pressure:  [21 cmH20-24 cmH20] 21 cmH20   Intake/Output Summary  (Last 24 hours) at 08/03/2019 1056 Last data filed at 08/03/2019 0600 Gross per 24 hour  Intake 2478.75 ml  Output 2600 ml  Net -121.25 ml   Filed Weights   08/01/19 0206 08/02/19 0412 08/03/19 0500  Weight: (!) 146.1 kg (!) 148.9 kg (!) 142.6 kg   Examination: General: Obese 59 year old black male intubated and sedated. HEENT: normocephalic atraumatic neck is large no clear JVD mucous membranes moist, old healed tracheostomy site midline  Lungs: Mechanical breath sounds throughout. no accessory use currently FiO2 50/PEEP 10. Cardiovascular: Distant heart sounds. Regular rhythm, borderline bradycardic Abdomen: Obese abdomen with positive bowel sounds, non-tender  Extremities: 1+ lower extremity edema bilaterally. DP and PT pulses palpable bilaterally Neuro: Responds to voice, communicates with hand gestures. Moves all 4 extremities. GU: Due to void Foley catheter placed  Resolved Hospital Problem list   Lactic acidosis Mild leukocytosis  Assessment & Plan:  Acute on chronic hypoxic and hypercarbic respiratory failure in the setting of what appears to be pulmonary edema likely due to acute diastolic heart failure, all complicated by underlying severe OHS and OSA as well as  COPD -Procalcitonin now within normal limits and CXR showing mild interval improvements daily. Low suspicion for infection but will complete course of antibiotics and follow up respiratory cultures, which are still pending.  Plan -Continue to wean vent as able. This morning, PEEP 10 mm Hg with FiO2 turned down to 40%. He is maintaining sats in low-mid 90s.  -Repeat CXR in AM -Continue azithromycin/CTX x 5 days, last dose 08/05/19 -F/u sputum culture results -PEEP titrated for SpO2 > 88 -PAD protocol with RASS goal -2 -Strict I/O's. Net +233 mL in past 24 hours. -Scheduled bronchodilators -Continue to hold antihypertensives in setting of soft BPs -VAP bundle  Hypokalemia K 3.2 this morning. Repleted per  protocol this AM. Plan -Replace per protocol -IV K discontinued and replaced with oral K due to better absorption -Recheck K this morning -continue to monitor, daily BMP  Acute diastolic heart failure with pulmonary edema.He has known history of HFpEF with last recorded EF 55 to 60% he also has history of prior RV dilation Still responding well to Lasix, putting out 2.85 L urine in past 24 hours. Cr did bump slightly from yesterday.  Plan -Hold Lasix today -Continue Metolazone 2.5 mg daily -PRN IV beta-blockade for HR > 110 -Tele  Acute metabolic encephalopathy secondary to hypercarbia and hypoxia Patient appears to be mentating ok. He is responsive to voice and has been able to communicate despite his sedation. Plan -Supportive care -PAD protocol -Continue home Zoloft  CKD stage II/III with AKI Cr 1.71, slightly increased from yesterday. Still putting out good amount of urine but is approximately net even I/O's past 24 hours. In setting of progressively uptrending BUN (98) and bicarb (now 37, 24 on admission), it is likely that he is approaching euvolemia. Additionally, patient is 3 kg down from his admission weight. Given this, we will hold Lasix today and recheck a BMP in AM. If bicarb continues to rise, we will consider giving a single dose of acetazolamide tomorrow. Plan -Hold Lasix -Continue metolazone -Discuss acetazolamide tomorrow  -Renal dose medications -Continue to hold antihypertensives -[place] cath  -Strict I/O's -Trend Cr with daily BMPs  Diabetes with severe hyperglycemia Glucose 199 this AM. He was placed back on insulin drip  overnight.  Plan - insulin aspart 14 units q4 - insulin detemir 50 units BID - SSI resistant scale -discontinue insulin drip  Sacral Wound Wound care consulted. Per their note and exam, this is not a pressure ulcer but instead represents moisture-associated skin damage in the intergluteal cleft. Plan -Aquacel in gluteal cleft, cover  with foam dressing and change daily  -monitor for signs of infection  Best practice:  Diet: NPO Pain/Anxiety/Delirium protocol (if indicated): ordered VAP protocol (if indicated): per protocol DVT prophylaxis: SCDs, Lovenox GI prophylaxis: PPI Glucose control: SSI, aspart, Levemir Mobility: BR Code Status: full code  Family Communication: Sister updated 7/12 Disposition:  ICU admit  Labs   CBC: Recent Labs  Lab 07/30/19 1718 07/30/19 1724 07/30/19 1741 07/31/19 0516 07/31/19 0520 08/01/19 0528 08/02/19 0421  WBC 14.4*  --   --   --  15.2* 10.6* 9.7  NEUTROABS 7.4  --   --   --   --   --  7.0  HGB 14.6  17.0   < > 16.0 16.0 14.8 13.0 12.0*  HCT 47.1  50.0   < > 47.0 47.0 47.7 41.2 38.2*  MCV 90.6  --   --   --  89.2 88.6 91.0  PLT 295  --   --   --  202 178 161   < > = values in this interval not displayed.    Basic Metabolic Panel: Recent Labs  Lab 07/31/19 0946 07/31/19 1119 07/31/19 1800 07/31/19 1800 08/01/19 0528 08/01/19 2202 08/02/19 0355 08/02/19 0421 08/02/19 1524 08/03/19 0348  NA 140  --   --   --  142 141  --  144  --  147*  K 3.8   < >  --   --  3.0* 3.0*  --  3.1* 3.2* 3.2*  CL 99  --   --   --  96* 94*  --  97*  --  98  CO2 30  --   --   --  34* 36*  --  35*  --  37*  GLUCOSE 174*  --   --   --  196* 272*  --  186*  --  195*  BUN 36*  --   --   --  53* 63*  --  68*  --  78*  CREATININE 2.35*  --   --   --  1.90* 1.74*  --  1.59*  --  1.71*  CALCIUM 8.5*  --   --   --  8.8* 8.2*  --  8.2*  --  8.6*  MG  --    < > 2.0   < > 2.1 2.1 2.1  --  2.3 2.2  PHOS  --    < > 3.7  --  3.4  --  3.2  --  3.2 2.4*   < > = values in this interval not displayed.   GFR: Estimated Creatinine Clearance: 69.8 mL/min (A) (by C-G formula based on SCr of 1.71 mg/dL (H)). Recent Labs  Lab 07/30/19 1718 07/30/19 2155 07/30/19 2156 07/31/19 0520 07/31/19 1759 07/31/19 1800 08/01/19 0528 08/02/19 0355 08/02/19 0421 08/03/19 0348  PROCALCITON  --    < >   --   --   --  8.04 8.16 5.82  --  4.21  WBC 14.4*  --   --  15.2*  --   --  10.6*  --  9.7  --   LATICACIDVEN 6.0*  --  4.5*  --  1.7  --   --   --   --   --    < > = values in this interval not displayed.    Liver Function Tests: No results for input(s): AST, ALT, ALKPHOS, BILITOT, PROT, ALBUMIN in the last 168 hours. Recent Labs  Lab 07/30/19 2155  LIPASE 27   No results for input(s): AMMONIA in the last 168 hours.  ABG    Component Value Date/Time   PHART 7.340 (L) 07/31/2019 0516   PCO2ART 57.1 (H) 07/31/2019 0516   PO2ART 172 (H) 07/31/2019 0516   HCO3 30.6 (H) 07/31/2019 0516   TCO2 32 07/31/2019 0516   ACIDBASEDEF 1.0 07/30/2019 1724   O2SAT 99.0 07/31/2019 0516     Coagulation Profile: No results for input(s): INR, PROTIME in the last 168 hours.  Cardiac Enzymes: No results for input(s): CKTOTAL, CKMB, CKMBINDEX, TROPONINI in the last 168 hours.  HbA1C: Hgb A1c MFr Bld  Date/Time Value Ref Range Status  03/04/2019 07:08 AM 16.6 (H) 4.8 - 5.6 % Final    Comment:    (NOTE) Pre diabetes:          5.7%-6.4% Diabetes:              >6.4% Glycemic control for   <7.0% adults with diabetes   03/03/2019 01:55 PM >15.5 (H) 4.8 - 5.6 % Final    Comment:    (NOTE) **Verified by repeat analysis**         Prediabetes: 5.7 - 6.4         Diabetes: >6.4         Glycemic control for adults with diabetes: <7.0     CBG: Recent Labs  Lab 08/03/19 0301 08/03/19 0403 08/03/19 0506 08/03/19 0604 08/03/19 0721  GLUCAP 173* 163* 190* 167* 199*    Review of Systems:   Not able   Past Medical History  He,  has a past medical history of Asthma, Chronic respiratory failure with hypoxia ([place]) (07/09/2017), COPD (chronic obstructive pulmonary disease) ([place]), Diabetes mellitus without complication ([place]), Difficult intubation, Hypertension, Shortness of breath dyspnea, and Sleep apnea.   Surgical History    Past Surgical History:  Procedure Laterality Date  . IR  GASTROSTOMY TUBE MOD SED  07/05/2017  . TRACHEOSTOMY TUBE PLACEMENT N/A 06/17/2017   Procedure: TRACHEOSTOMY;  Surgeon: Leta Baptist, MD;  Location: [facility] OR;  Service: ENT;  Laterality: N/A;     Social History   reports that he quit smoking about 6 years ago. His smoking use included cigarettes. He started smoking about 36 years ago. He has a 0.25 pack-year smoking history. He has never used smokeless tobacco. He reports that he does not drink alcohol and does not use drugs.   Family History   His family history includes Asthma in his sister and another family member; Diabetes type II in his sister; Hypertension in his sister.   Allergies No Known Allergies   Home Medications  Prior to Admission medications   Medication Sig Start Date End Date Taking? Authorizing Provider  allopurinol (ZYLOPRIM) 100 MG tablet Take 100 mg by mouth daily.    [provider]  atorvastatin (LIPITOR) 10 MG tablet Take 10 mg by mouth daily.    [provider]  budesonide-formoterol (SYMBICORT) 160-4.5 MCG/ACT inhaler Inhale 2 puffs then rinse mouth, twice daiy Patient taking differently: Inhale 1 puff into the lungs 2 (two) times daily. Rinse mouth after use 01/24/19   Baird Lyons D, MD  desonide (DESOWEN) 0.05 %  lotion Apply 1 application topically daily. Apply to face    [provider]  insulin aspart (NOVOLOG FLEXPEN) 100 UNIT/ML FlexPen Inject 8 Units into the skin 3 (three) times daily with meals. 03/06/19   Marianna Payment, MD  insulin glargine (LANTUS) 100 unit/mL SOPN Inject 0.35 mLs (35 Units total) into the skin at bedtime. 03/06/19 04/05/19  Marianna Payment, MD  Insulin Pen Needle (BD ULTRA-FINE PEN NEEDLES) 29G X 12.7MM MISC USE AS DIRECTED WITH LANTUS 12/23/17   [provider]  ketoconazole (NIZORAL) 2 % cream Apply 1 application topically daily. Apply to wounds    [provider]  metFORMIN (GLUCOPHAGE) 1000 MG tablet Take 1 tablet (1,000 mg total) by mouth 2 (two)  times daily with a meal. 03/06/19 03/05/20  Chundi, Verne Spurr, MD  metolazone (ZAROXOLYN) 2.5 MG tablet Take 2.5 mg by mouth daily.    [provider]  potassium chloride SA (K-DUR,KLOR-CON) 20 MEQ tablet Take 1 tablet (20 mEq total) by mouth daily. 07/29/17   Angiulli, Lavon Paganini, PA-C  sertraline (ZOLOFT) 100 MG tablet Take 100 mg by mouth daily.     [provider]  torsemide (DEMADEX) 5 MG tablet Take 5 mg by mouth daily. 01/17/19   [provider]     Critical care time:       Riccardo Dubin, MS4

## 2019-08-03 NOTE — Progress Notes (Signed)
Came to room d/t pt vent alarming, noted RR 40.  BBSH w/ rhonchi t/o.  Suctioned for large/copious secretions, pink/blood tinged, tan/yellow very thick w/ some plugs.  Pt briefly plugged off suction catheter and noted pt sat suddenly dropped to 55%.  Pt vent fio2 increased 100% and immediately ambu bagged on 100% fio2, lavaged d/t copious thick/plug secretions.  Sat quickly improved to 90-92% w/ bagging and lavage/suctioning.  Once secretions cleared and pt bagged easier, pt was placed back on vent, 100% fio2.  RN in room t/o and assisted RT.  Pt appears to be tolerating vent well currently.  Sat 99%.

## 2019-08-04 ENCOUNTER — Inpatient Hospital Stay (HOSPITAL_COMMUNITY): Payer: Medicaid Other

## 2019-08-04 DIAGNOSIS — J9809 Other diseases of bronchus, not elsewhere classified: Secondary | ICD-10-CM

## 2019-08-04 LAB — BASIC METABOLIC PANEL
Anion gap: 11 (ref 5–15)
BUN: 77 mg/dL — ABNORMAL HIGH (ref 6–20)
CO2: 37 mmol/L — ABNORMAL HIGH (ref 22–32)
Calcium: 8.7 mg/dL — ABNORMAL LOW (ref 8.9–10.3)
Chloride: 104 mmol/L (ref 98–111)
Creatinine, Ser: 1.45 mg/dL — ABNORMAL HIGH (ref 0.61–1.24)
GFR calc Af Amer: >60 mL/min (ref >60)
GFR calc non Af Amer: 53 mL/min — ABNORMAL LOW (ref >60)
Glucose, Bld: 165 mg/dL — ABNORMAL HIGH (ref 70–99)
Potassium: 3.7 mmol/L (ref 3.5–5.1)
Sodium: 152 mmol/L — ABNORMAL HIGH (ref 135–145)

## 2019-08-04 LAB — PHOSPHORUS: Phosphorus: 3.3 mg/dL (ref 2.5–4.6)

## 2019-08-04 LAB — GLUCOSE, CAPILLARY
Glucose-Capillary: 171 mg/dL — ABNORMAL HIGH (ref 70–99)
Glucose-Capillary: 132 mg/dL — ABNORMAL HIGH (ref 70–99)
Glucose-Capillary: 123 mg/dL — ABNORMAL HIGH (ref 70–99)
Glucose-Capillary: 202 mg/dL — ABNORMAL HIGH (ref 70–99)
Glucose-Capillary: 106 mg/dL — ABNORMAL HIGH (ref 70–99)
Glucose-Capillary: 80 mg/dL (ref 70–99)
Glucose-Capillary: 92 mg/dL (ref 70–99)

## 2019-08-04 LAB — TRIGLYCERIDES: Triglycerides: 180 mg/dL — ABNORMAL HIGH (ref <150)

## 2019-08-04 LAB — MAGNESIUM: Magnesium: 2.4 mg/dL (ref 1.7–2.4)

## 2019-08-04 MED ORDER — FENTANYL CITRATE (PF) 100 MCG/2ML IJ SOLN
INTRAMUSCULAR | Status: AC
  Filled 2019-08-04: qty 2

## 2019-08-04 MED ORDER — ETOMIDATE 2 MG/ML IV SOLN
INTRAVENOUS | Status: AC
  Administered 2019-08-04: 20 mg
  Filled 2019-08-04: qty 20

## 2019-08-04 MED ORDER — ROCURONIUM BROMIDE 10 MG/ML (PF) SYRINGE
PREFILLED_SYRINGE | INTRAVENOUS | Status: AC
  Administered 2019-08-04: 100 mg
  Filled 2019-08-04: qty 10

## 2019-08-04 MED ORDER — SUCCINYLCHOLINE CHLORIDE 200 MG/10ML IV SOSY
PREFILLED_SYRINGE | INTRAVENOUS | Status: AC
  Filled 2019-08-04: qty 10

## 2019-08-04 MED ORDER — MIDAZOLAM HCL 2 MG/2ML IJ SOLN
INTRAMUSCULAR | Status: AC
  Filled 2019-08-04: qty 4

## 2019-08-04 MED ORDER — VANCOMYCIN HCL 1750 MG/350ML IV SOLN
1750.0000 mg | Freq: Once | INTRAVENOUS | Status: AC
  Administered 2019-08-04: 1750 mg via INTRAVENOUS
  Filled 2019-08-04: qty 350

## 2019-08-04 MED ORDER — VECURONIUM BROMIDE 10 MG IV SOLR
INTRAVENOUS | Status: AC
  Filled 2019-08-04: qty 10

## 2019-08-04 MED ORDER — PIPERACILLIN-TAZOBACTAM 3.375 G IVPB
3.3750 g | Freq: Three times a day (TID) | INTRAVENOUS | Status: DC
  Administered 2019-08-04 – 2019-08-08 (×11): 3.375 g via INTRAVENOUS
  Filled 2019-08-04 (×13): qty 50

## 2019-08-04 MED FILL — Medication: Qty: 1 | Status: AC

## 2019-08-04 NOTE — Procedures (Signed)
Preop diagnosis: Respiratory failure, tracheostomy status Postop diagnosis: same Procedure: Tracheoscopy via tracheostomy Surgeon: Redmond Baseman Anesth: None Compl: None Findings: End of trach tube within middle of trachea with no narrowing distal to tube.  [place]ina well-visualized. Description:  At the bedside, the fiberoptic bronchoscope was passed through the trach tube to view the trachea and primary bronchi.  The scope was removed and the patient was returned to nursing care in stable condition.

## 2019-08-04 NOTE — Consult Note (Signed)
Reason for Consult: Airway management Referring Physician: CCM  Timothy Le is an 59 y.o. male.  HPI: 59 year old male with multiple medical problems has a previous history of tracheostomy and has been intubated for five days due to respiratory failure.  Planned extubation today resulted in acute respiratory distress.  After failed attempts at reintubation, a bedside emergency tracheostomy was placed by Dr. Grandville Silos.  He has been ventilating well since then.  Past Medical History:  Diagnosis Date  . Asthma   . Chronic respiratory failure with hypoxia ([place]) 07/09/2017  . COPD (chronic obstructive pulmonary disease) ([place])   . Diabetes mellitus without complication ([place])   . Difficult intubation   . Hypertension   . Shortness of breath dyspnea   . Sleep apnea     Past Surgical History:  Procedure Laterality Date  . IR GASTROSTOMY TUBE MOD SED  07/05/2017  . TRACHEOSTOMY TUBE PLACEMENT N/A 06/17/2017   Procedure: TRACHEOSTOMY;  Surgeon: Leta Baptist, MD;  Location: [facility] OR;  Service: ENT;  Laterality: N/A;    Family History  Problem Relation Age of Onset  . Asthma Sister   . Asthma Other        nephew  . Hypertension Sister   . Diabetes type II Sister     Social History:  reports that he quit smoking about 6 years ago. His smoking use included cigarettes. He started smoking about 36 years ago. He has a 0.25 pack-year smoking history. He has never used smokeless tobacco. He reports that he does not drink alcohol and does not use drugs.  Allergies: No Known Allergies  Medications: I have reviewed the patient's current medications.  Results for orders placed or performed during the hospital encounter of 07/30/19 (from the past 48 hour(s))  Magnesium     Status: None   Collection Time: 08/02/19  3:24 PM  Result Value Ref Range   Magnesium 2.3 1.7 - 2.4 mg/dL    Comment: Performed at [facility], [place] [address], [place], [place] [address]  Phosphorus     Status: None    Collection Time: 08/02/19  3:24 PM  Result Value Ref Range   Phosphorus 3.2 2.5 - 4.6 mg/dL    Comment: Performed at [facility] [address] [place], [place] [address]  Potassium     Status: Abnormal   Collection Time: 08/02/19  3:24 PM  Result Value Ref Range   Potassium 3.2 (L) 3.5 - 5.1 mmol/L    Comment: Performed at [facility] [address] [place], [place] [address]  Glucose, capillary     Status: Abnormal   Collection Time: 08/02/19  3:31 PM  Result Value Ref Range   Glucose-Capillary 188 (H) 70 - 99 mg/dL    Comment: Glucose reference range applies only to samples taken after fasting for at least 8 hours.  Glucose, capillary     Status: Abnormal   Collection Time: 08/02/19  7:52 PM  Result Value Ref Range   Glucose-Capillary 223 (H) 70 - 99 mg/dL    Comment: Glucose reference range applies only to samples taken after fasting for at least 8 hours.  Glucose, capillary     Status: Abnormal   Collection Time: 08/02/19 11:54 PM  Result Value Ref Range   Glucose-Capillary 225 (H) 70 - 99 mg/dL    Comment: Glucose reference range applies only to samples taken after fasting for at least 8 hours.  Glucose, capillary  Status: Abnormal   Collection Time: 08/03/19 12:47 AM  Result Value Ref Range   Glucose-Capillary 220 (H) 70 - 99 mg/dL    Comment: Glucose reference range applies only to samples taken after fasting for at least 8 hours.  Glucose, capillary     Status: Abnormal   Collection Time: 08/03/19  1:53 AM  Result Value Ref Range   Glucose-Capillary 219 (H) 70 - 99 mg/dL    Comment: Glucose reference range applies only to samples taken after fasting for at least 8 hours.  Glucose, capillary     Status: Abnormal   Collection Time: 08/03/19  3:01 AM  Result Value Ref Range   Glucose-Capillary 173 (H) 70 - 99 mg/dL    Comment: Glucose reference range applies only to samples taken after fasting for at least 8 hours.  Magnesium     Status: None    Collection Time: 08/03/19  3:48 AM  Result Value Ref Range   Magnesium 2.2 1.7 - 2.4 mg/dL    Comment: Performed at [facility] [address], [place], [place] [address]  Phosphorus     Status: Abnormal   Collection Time: 08/03/19  3:48 AM  Result Value Ref Range   Phosphorus 2.4 (L) 2.5 - 4.6 mg/dL    Comment: Performed at [facility] [address], [place], [place] [address]  Procalcitonin     Status: None   Collection Time: 08/03/19  3:48 AM  Result Value Ref Range   Procalcitonin 4.21 ng/mL    Comment:        Interpretation: PCT > 2 ng/mL: Systemic infection (sepsis) is likely, unless other causes are known. (NOTE)       Sepsis PCT Algorithm           Lower Respiratory Tract                                      Infection PCT Algorithm    ----------------------------     ----------------------------         PCT < 0.25 ng/mL                PCT < 0.10 ng/mL          Strongly encourage             Strongly discourage   discontinuation of antibiotics    initiation of antibiotics    ----------------------------     -----------------------------       PCT 0.25 - 0.50 ng/mL            PCT 0.10 - 0.25 ng/mL               OR       >80% decrease in PCT            Discourage initiation of                                            antibiotics      Encourage discontinuation           of antibiotics    ----------------------------     -----------------------------         PCT >= 0.50 ng/mL              PCT  0.26 - 0.50 ng/mL               AND       <80% decrease in PCT              Encourage initiation of                                             antibiotics       Encourage continuation           of antibiotics    ----------------------------     -----------------------------        PCT >= 0.50 ng/mL                  PCT > 0.50 ng/mL               AND         increase in PCT                  Strongly encourage                                      initiation of  antibiotics    Strongly encourage escalation           of antibiotics                                     -----------------------------                                           PCT <= 0.25 ng/mL                                                 OR                                        > 80% decrease in PCT                                      Discontinue / Do not initiate                                             antibiotics  Performed at [facility], [address] [place], [place] [address]   Triglycerides     Status: Abnormal   Collection Time: 08/03/19  3:48 AM  Result Value Ref Range   Triglycerides 209 (H) <150 mg/dL    Comment: Performed at [facility] [address] [place], [place] [address]  Brain natriuretic peptide     Status: None   Collection Time: 08/03/19  3:48 AM  Result Value Ref  Range   B Natriuretic Peptide 90.9 0.0 - 100.0 pg/mL    Comment: Performed at [facility] [address] [place], [place] [address]  Basic metabolic panel     Status: Abnormal   Collection Time: 08/03/19  3:48 AM  Result Value Ref Range   Sodium 147 (H) 135 - 145 mmol/L   Potassium 3.2 (L) 3.5 - 5.1 mmol/L   Chloride 98 98 - 111 mmol/L   CO2 37 (H) 22 - 32 mmol/L   Glucose, Bld 195 (H) 70 - 99 mg/dL    Comment: Glucose reference range applies only to samples taken after fasting for at least 8 hours.   BUN 78 (H) 6 - 20 mg/dL   Creatinine, Ser 1.71 (H) 0.61 - 1.24 mg/dL   Calcium 8.6 (L) 8.9 - 10.3 mg/dL   GFR calc non Af Amer 43 (L) >60 mL/min   GFR calc Af Amer 50 (L) >60 mL/min   Anion gap 12 5 - 15    Comment: Performed at [facility] [address] [place], [place] [address]  Glucose, capillary     Status: Abnormal   Collection Time: 08/03/19  4:03 AM  Result Value Ref Range   Glucose-Capillary 163 (H) 70 - 99 mg/dL    Comment: Glucose reference range applies only to samples taken after fasting for at least 8 hours.  Glucose, capillary      Status: Abnormal   Collection Time: 08/03/19  5:06 AM  Result Value Ref Range   Glucose-Capillary 190 (H) 70 - 99 mg/dL    Comment: Glucose reference range applies only to samples taken after fasting for at least 8 hours.  Glucose, capillary     Status: Abnormal   Collection Time: 08/03/19  6:04 AM  Result Value Ref Range   Glucose-Capillary 167 (H) 70 - 99 mg/dL    Comment: Glucose reference range applies only to samples taken after fasting for at least 8 hours.  Glucose, capillary     Status: Abnormal   Collection Time: 08/03/19  7:21 AM  Result Value Ref Range   Glucose-Capillary 199 (H) 70 - 99 mg/dL    Comment: Glucose reference range applies only to samples taken after fasting for at least 8 hours.  Glucose, capillary     Status: Abnormal   Collection Time: 08/03/19  8:38 AM  Result Value Ref Range   Glucose-Capillary 212 (H) 70 - 99 mg/dL    Comment: Glucose reference range applies only to samples taken after fasting for at least 8 hours.  Glucose, capillary     Status: Abnormal   Collection Time: 08/03/19  9:46 AM  Result Value Ref Range   Glucose-Capillary 206 (H) 70 - 99 mg/dL    Comment: Glucose reference range applies only to samples taken after fasting for at least 8 hours.  Glucose, capillary     Status: Abnormal   Collection Time: 08/03/19 10:48 AM  Result Value Ref Range   Glucose-Capillary 202 (H) 70 - 99 mg/dL    Comment: Glucose reference range applies only to samples taken after fasting for at least 8 hours.  Glucose, capillary     Status: Abnormal   Collection Time: 08/03/19 11:51 AM  Result Value Ref Range   Glucose-Capillary 173 (H) 70 - 99 mg/dL    Comment: Glucose reference range applies only to samples taken after fasting for at least 8 hours.  Glucose, capillary     Status: Abnormal   Collection  Time: 08/03/19  1:10 PM  Result Value Ref Range   Glucose-Capillary 187 (H) 70 - 99 mg/dL    Comment: Glucose reference range applies only to samples taken  after fasting for at least 8 hours.  Hemoglobin A1c     Status: Abnormal   Collection Time: 08/03/19  1:22 PM  Result Value Ref Range   Hgb A1c MFr Bld 14.9 (H) 4.8 - 5.6 %    Comment: (NOTE) Pre diabetes:          5.7%-6.4%  Diabetes:              >6.4%  Glycemic control for   <7.0% adults with diabetes    Mean Plasma Glucose 380.93 mg/dL    Comment: Performed at [facility] [address] [place], [place] [address]  Glucose, capillary     Status: Abnormal   Collection Time: 08/03/19  4:59 PM  Result Value Ref Range   Glucose-Capillary 160 (H) 70 - 99 mg/dL    Comment: Glucose reference range applies only to samples taken after fasting for at least 8 hours.  Glucose, capillary     Status: Abnormal   Collection Time: 08/03/19  7:51 PM  Result Value Ref Range   Glucose-Capillary 122 (H) 70 - 99 mg/dL    Comment: Glucose reference range applies only to samples taken after fasting for at least 8 hours.  Magnesium     Status: Abnormal   Collection Time: 08/03/19  8:00 PM  Result Value Ref Range   Magnesium 2.5 (H) 1.7 - 2.4 mg/dL    Comment: Performed at [facility] [address], [place], [place] [address]  Phosphorus     Status: None   Collection Time: 08/03/19  8:00 PM  Result Value Ref Range   Phosphorus 4.0 2.5 - 4.6 mg/dL    Comment: Performed at [facility], [place] [address], [place], [place] [address]  Basic metabolic panel Once     Status: Abnormal   Collection Time: 08/03/19  8:00 PM  Result Value Ref Range   Sodium 152 (H) 135 - 145 mmol/L   Potassium 3.9 3.5 - 5.1 mmol/L   Chloride 105 98 - 111 mmol/L   CO2 33 (H) 22 - 32 mmol/L   Glucose, Bld 110 (H) 70 - 99 mg/dL    Comment: Glucose reference range applies only to samples taken after fasting for at least 8 hours.   BUN 78 (H) 6 - 20 mg/dL   Creatinine, Ser 1.56 (H) 0.61 - 1.24 mg/dL   Calcium 8.7 (L) 8.9 - 10.3 mg/dL   GFR calc non Af Amer 49 (L) >60 mL/min   GFR calc Af Amer 56 (L) >60  mL/min   Anion gap 14 5 - 15    Comment: Performed at [facility] [address], [place],  [address]  Glucose, capillary     Status: Abnormal   Collection Time: 08/03/19 11:45 PM  Result Value Ref Range   Glucose-Capillary 109 (H) 70 - 99 mg/dL    Comment: Glucose reference range applies only to samples taken after fasting for at least 8 hours.  Glucose, capillary     Status: Abnormal   Collection Time: 08/04/19  1:20 AM  Result Value Ref Range   Glucose-Capillary 171 (H) 70 - 99 mg/dL    Comment: Glucose reference range applies only to samples taken after fasting for at least 8 hours.  Magnesium  Status: None   Collection Time: 08/04/19  3:53 AM  Result Value Ref Range   Magnesium 2.4 1.7 - 2.4 mg/dL    Comment: Performed at [facility], [place] [address], [place], [place] [address]  Phosphorus     Status: None   Collection Time: 08/04/19  3:53 AM  Result Value Ref Range   Phosphorus 3.3 2.5 - 4.6 mg/dL    Comment: Performed at [facility] [address], [place], [place] [address]  Triglycerides     Status: Abnormal   Collection Time: 08/04/19  3:53 AM  Result Value Ref Range   Triglycerides 180 (H) <150 mg/dL    Comment: Performed at [facility] [address], [place], [place] [address]  Basic metabolic panel Once     Status: Abnormal   Collection Time: 08/04/19  3:53 AM  Result Value Ref Range   Sodium 152 (H) 135 - 145 mmol/L   Potassium 3.7 3.5 - 5.1 mmol/L   Chloride 104 98 - 111 mmol/L   CO2 37 (H) 22 - 32 mmol/L   Glucose, Bld 165 (H) 70 - 99 mg/dL    Comment: Glucose reference range applies only to samples taken after fasting for at least 8 hours.   BUN 77 (H) 6 - 20 mg/dL   Creatinine, Ser 1.45 (H) 0.61 - 1.24 mg/dL   Calcium 8.7 (L) 8.9 - 10.3 mg/dL   GFR calc non Af Amer 53 (L) >60 mL/min   GFR calc Af Amer >60 >60 mL/min   Anion gap 11 5 - 15    Comment: Performed at [facility] [address], [place],  [place] [address]  Glucose, capillary     Status: Abnormal   Collection Time: 08/04/19  4:56 AM  Result Value Ref Range   Glucose-Capillary 132 (H) 70 - 99 mg/dL    Comment: Glucose reference range applies only to samples taken after fasting for at least 8 hours.  Glucose, capillary     Status: Abnormal   Collection Time: 08/04/19  8:22 AM  Result Value Ref Range   Glucose-Capillary 123 (H) 70 - 99 mg/dL    Comment: Glucose reference range applies only to samples taken after fasting for at least 8 hours.  Glucose, capillary     Status: Abnormal   Collection Time: 08/04/19  1:04 PM  Result Value Ref Range   Glucose-Capillary 202 (H) 70 - 99 mg/dL    Comment: Glucose reference range applies only to samples taken after fasting for at least 8 hours.    DG CHEST PORT 1 VIEW  Result Date: 08/04/2019 CLINICAL DATA:  Endotracheal tube placement. EXAM: PORTABLE CHEST 1 VIEW COMPARISON:  08/03/2019 FINDINGS: Endotracheal tube tip is approximately 6.5 cm above the base of the carina. Right IJ central line tip overlies the mid SVC level. The NG tube passes into the stomach although the distal tip position is not included on the film. Cardiopericardial silhouette is at upper limits of normal for size. There is pulmonary vascular congestion without overt pulmonary edema. Bibasilar collapse/consolidative opacity is similar to prior. No substantial pleural effusion. Telemetry leads overlie the chest. IMPRESSION: 1. Endotracheal tube tip is approximately 6.5 cm above the base of the carina. 2. Persistent bibasilar collapse/consolidative opacity. Electronically Signed   By: Misty Stanley M.D.   On: 08/04/2019 07:55   DG Chest Port 1 View  Result Date: 08/03/2019 CLINICAL DATA:  Pulmonary edema. EXAM: PORTABLE CHEST 1 VIEW  COMPARISON:  August 02, 2019. FINDINGS: Stable cardiomediastinal silhouette. Endotracheal and nasogastric tubes are unchanged in position. Right internal jugular catheter is unchanged. No pneumothorax  is noted. Mild bibasilar subsegmental atelectasis is noted. No significant pleural effusion is noted. Bony thorax is unremarkable. IMPRESSION: Mild bibasilar subsegmental atelectasis. Electronically Signed   By: Marijo Conception M.D.   On: 08/03/2019 08:06    Review of Systems  Unable to perform ROS: Intubated   Blood pressure 105/61, pulse (!) 50, temperature (!) 100.7 F (38.2 C), temperature source Axillary, resp. rate (!) 30, height 6' (1.829 m), weight (!) 145.4 kg, SpO2 (!) 89 %. Physical Exam Constitutional:      Appearance: He is obese.     Comments: Sedated.  HENT:     Head: Normocephalic.     Right Ear: External ear normal.     Left Ear: External ear normal.     Nose: Nose normal.     Mouth/Throat:     Mouth: Mucous membranes are moist.  Eyes:     Comments: Closed  Neck:     Comments: #6 Shiley trach in place with minimal bleeding, ventilating well Cardiovascular:     Rate and Rhythm: Normal rate.  Pulmonary:     Comments: Mechanical ventilation Skin:    General: Skin is warm.  Neurological:     Comments: Sedated  Psychiatric:     Comments: Sedated     Assessment/Plan: Respiratory failure, tracheal stenosis, thyroid goiter  Fiberoptic exam of the airway was performed through the trach tube and position looks good in regards to any narrowing.  Will review placement position with Dr. Grandville Silos.  It looks like a stable airway to be able to be used for ventilator management.  Wean per usual.  Thyroidectomy will likely be needed before plans to decannulate.  Timothy Le 08/04/2019, 1:13 PM

## 2019-08-04 NOTE — Procedures (Signed)
Bronchoscopy Procedure Note  QUANELL LOUGHNEY  [id number]  06-13-1960  Date:08/04/19  Time:1:06 PM   Provider Performing:Deshea Pooley C Tamala Julian   Procedure(s):  Initial Therapeutic Aspiration of Tracheobronchial Tree [phone])  Indication(s) Confirm Trach placement Therapeutic aspiration of blood clots from R lung  Consent Unable to obtain consent due to emergent nature of procedure.  Anesthesia In place from intubation   Time Out Verified patient identification, verified procedure, site/side was marked, verified correct patient position, special equipment/implants available, medications/allergies/relevant history reviewed, required imaging and test results available.   Sterile Technique Usual hand hygiene, masks, gowns, and gloves were used   Procedure Description Bronchoscope advanced through tracheostomy tube and into airway.  Airways were examined down to subsegmental level with findings noted below.   Following diagnostic evaluation, Therapeutic aspiration performed in right mainstem down through RUL/RML/RLL  Findings:  Trach in appropriate position Clot in R mainstem suctioned   Complications/Tolerance None; patient tolerated the procedure well. Chest X-ray is not needed post procedure.   EBL Minimal   Specimen(s) None

## 2019-08-04 NOTE — Progress Notes (Signed)
Timothy Le Sister   [phone]   Called POA at number above to update her on day's events.  Erskine Emery MD PCCM

## 2019-08-04 NOTE — Progress Notes (Signed)
RT attempted to wean patient x2 on 12/8 CPAP/PS. RN turned sedation down. RT will try again later.

## 2019-08-04 NOTE — Progress Notes (Signed)
Patient was extubated and quickly deteriorated with stridor, delirium, desaturations.  Multiple attempts made by myself and NP from above, able to get ETT to cords but would not pass using bougee, bronch, rigid and soft stylets  Desaturated but did come up with bagging.  I prepped neck and dissected, unable to get good window into trachea so Dr. Grandville Silos called to bedside and was able to get a 6-0 shiley into airway.  Confirmed placement with bronchoscope, post placement performed therapeutic bronch with suctioning of blood from right mainstem.  Patient never lost pulse, low sats intermittently probably totaling 10 minutes.  Total of 80 minutes additional critical care time with events above.  Erskine Emery MD PCCM

## 2019-08-04 NOTE — Progress Notes (Signed)
NAME:  Timothy Le, MRN:  [id number], DOB:  Jan 11, 1961, LOS: 5 ADMISSION DATE:  07/30/2019, CONSULTATION DATE:  7/11 REFERRING MD:  Mariane Masters, CHIEF COMPLAINT:  Acute respiratory failure    Brief History   59 year old black male with baseline chronic respiratory failure on the basis of severe OHS/OSA complicated further by significant obstructive disease in the setting of COPD.  Presents with acute on chronic hypoxic and hypercarbic respiratory failure.  Admitted with the initial working diagnosis of pulmonary edema and decompensated diastolic heart failure. Per family patient was without CPAP for 2 weeks PTA as his machine broke.  History of present illness   59 year old male patient known to pulmonary division followed for chronic respiratory failure on the basis of severe COPD, as well as significant restrictive disease by obesity hypoventilation and obstructive sleep apnea.  He is managed on BiPAP, after successfully having significant weight loss which allowed him to have tracheostomy removed following successful titration study.  He has been followed by Dr. Annamaria Boots in the outpatient setting last seen in May.  And had been doing well followed by a nutritionist and walking daily. Presents to the emergency room via EMS today on 7/11.  Apparently had been taking garbage out, on return to the house was acutely short of breath with significant wheezing.  He tried bronchodilator, also placed himself on his home BiPAP device without improvement EMS was therefore called fire arrived first finding him minimally responsive he was manually assisted ventilation with bag valve mask, given albuterol nebulizer and transferred to the emergency room.  He was unresponsive on presentation and was intubated for airway protection, marked hypoxia, and presumptive hypercarbia.  After airway was stabilized The following initial diagnostic data was identified: White blood cell count 14.4, initial venous blood gas PCO2 64,  bicarbonate 28, pH 7.25, follow-up intubation arterial blood gas pH 7.22, PCO2 74, PO2 40 bicarbonate 30, pulse oximetry 63%.  Portable chest x-ray with diffuse pulmonary edema endotracheal tube in satisfactory position.  On i-STAT chemistry creatinine 1.5 up from baseline of 1.35-1.43 glucose 652. Critical Care admitted.  Overnight, one event of desaturation to 70s, resolved with suctioning by RT and RN. Na increased from 147-152, free water flushes increased to 60 mL/hr. Febrile this morning to 100.7*F at 0830. Peep decreased to 8, saturations stable at 100%, FiO2 40%.   Past Medical History  History of OHS, OSA, diastolic heart failure, chronic respiratory failure on this basis.  Also has a history of prior tracheostomy this was removed after significant weight loss and successful BiPAP titration study.  Morbid obesity, diabetes type 2, anemia, COPD with significant obstructive disease and slight response to bronchodilators FEV1 1.77/59% predicted prior smoker  Significant Hospital Events   7/11 and admitted: Intubated.  Requiring high PEEP for refractory hypoxia, central line placed, IV diuresis administered   Consults:    Procedures:  Endotracheal tube 7/11 Right IJ triple-lumen catheter 7/11  Significant Diagnostic Tests:  Echocardiogram 7/11 >> no evidence of RV strain Lower extremity ultrasound 7/11 >> no evidence of lower extremity DVT CXR 7/13 >> interval improvement; airspace disease consistent with multifocal pneumonia per radiology report Micro Data:  Sputum culture 7/11 >> Rare growth consistent with normal respiratory flora Blood culture 7/11 >>  Antimicrobials:  None  Interim history/subjective:  No acute events overnight. Still sedated with propofol and fentanyl drips. Continuing to wean vent, PEEP down to 10, FiO2 40%.    Objective   Blood pressure (!) 102/58, pulse (!) 55, temperature (!)  100.7 F (38.2 C), temperature source Axillary, resp. rate (!) 30, height 6'  (1.829 m), weight (!) 145.4 kg, SpO2 99 %.    Vent Mode: PRVC FiO2 (%):  [40 %-100 %] 40 % Set Rate:  [30 bmp] 30 bmp Vt Set:  [400 mL] 400 mL PEEP:  [8 cmH20] 8 cmH20 Plateau Pressure:  [20 cmH20-22 cmH20] 20 cmH20   Intake/Output Summary (Last 24 hours) at 08/04/2019 0958 Last data filed at 08/04/2019 0930 Gross per 24 hour  Intake 2637.32 ml  Output 2490 ml  Net 147.32 ml   Filed Weights   08/02/19 0412 08/03/19 0500 08/04/19 0500  Weight: (!) 148.9 kg (!) 142.6 kg (!) 145.4 kg   Examination: General: Obese 59 year old black male intubated and sedated. HEENT: normocephalic atraumatic neck is large no clear JVD mucous membranes moist, old healed tracheostomy site midline  Lungs: Mechanical breath sounds throughout. no accessory use currently FiO2 40/PEEP 8. Cardiovascular: Distant heart sounds. Regular rhythm, borderline bradycardic Abdomen: Obese abdomen with positive bowel sounds, non-tender  Extremities: 1+ lower extremity edema bilaterally. DP and PT pulses palpable bilaterally Neuro: Responds to voice, communicates with hand gestures. Moves all 4 extremities. GU: Due to void Foley catheter placed  Resolved Hospital Problem list   Lactic acidosis Mild leukocytosis  Assessment & Plan:  Acute on chronic hypoxic and hypercarbic respiratory failure in the setting of what appears to be pulmonary edema likely due to acute diastolic heart failure, all complicated by underlying severe OHS and OSA as well as  COPD -Procalcitonin elevated to >8 on 7/12, decreased to 4.21 on 7/15. Azithromycin and CTX started on 7/13 for 5 days. Respiratory culture c/w normal respiratory flora. Last fever 7/14 at 100.4*F, and again this morning at 0830 to 100.7*F. Plan -Continue to wean vent as able. This morning, PEEP 8 mm Hg with FiO2 turned down to 40%. He is maintaining sats in low-mid 90s.  -Continue azithromycin/CTX x 5 days, last dose 08/05/19 -PEEP titrated for SpO2 > 88 -PAD protocol with  RASS goal -2 -Strict I/O's. Net -76 mL in past 24 hours. -Scheduled bronchodilators -Continue to hold antihypertensives in setting of soft BPs -VAP bundle  Hypernatremia Na increased from 147 to 152 last night, no change after initiated 60 mL free water. - Daily BMP - Free water deficit calculated to be 4.2L - Down 3.5 kg from 7/14  Hypokalemia Resolved, 3.7 this AM Plan -continue to monitor, daily BMP  Acute diastolic heart failure with pulmonary edema.He has known history of HFpEF with last recorded EF 55 to 60% he also has history of prior RV dilation Given Lasix on 7/14 with 2.85 L UOP. Only on metolazone yesterday with appropriate UOP of 2.79 L. Cr 1.45 today, improved from 1.71 yesterday. Plan -Continue Metolazone 2.5 mg daily -PRN IV beta-blockade for HR > 110 -Tele  Acute metabolic encephalopathy secondary to hypercarbia and hypoxia Patient moving limbs, but not awake with sedation. Plan -Supportive care -PAD protocol -Continue home Zoloft  CKD stage II/III with AKI Cr improved from 1.71 yesterday to 1.45 today. Still putting out good amount of urine (2.79L), but is approximately net even I/O's past 24 hours. In setting of hypernatremia to 152, likely patient is hypovolemic. Will continue with metolazone. Plan -Continue metolazone  -Renal dose medications -Continue to hold antihypertensives -[place] cath  -Strict I/O's -Trend Cr with daily BMPs  Diabetes with severe hyperglycemia Glucose 165 this AM, subcutaneous insulin continued. Well controlled no need for insulin drip. Plan - insulin aspart  18 units q4 - insulin detemir 55 units BID - SSI resistant scale  [facility] Wound Wound care consulted. Per their note and exam, this is not a pressure ulcer but instead represents moisture-associated skin damage in the intergluteal cleft. Plan -Aquacel in gluteal cleft, cover with foam dressing and change daily  -monitor for signs of infection  Best practice:  Diet:  NPO Pain/Anxiety/Delirium protocol (if indicated): ordered VAP protocol (if indicated): per protocol DVT prophylaxis: SCDs, Lovenox GI prophylaxis: PPI Glucose control: SSI, aspart, Levemir Mobility: BR Code Status: full code  Family Communication: Sister updated 7/12 Disposition:  ICU admit  Labs   CBC: Recent Labs  Lab 07/30/19 1718 07/30/19 1724 07/30/19 1741 07/31/19 0516 07/31/19 0520 08/01/19 0528 08/02/19 0421  WBC 14.4*  --   --   --  15.2* 10.6* 9.7  NEUTROABS 7.4  --   --   --   --   --  7.0  HGB 14.6  17.0   < > 16.0 16.0 14.8 13.0 12.0*  HCT 47.1  50.0   < > 47.0 47.0 47.7 41.2 38.2*  MCV 90.6  --   --   --  89.2 88.6 91.0  PLT 295  --   --   --  202 178 161   < > = values in this interval not displayed.    Basic Metabolic Panel: Recent Labs  Lab 08/01/19 2202 08/01/19 2202 08/02/19 0355 08/02/19 0421 08/02/19 1524 08/03/19 0348 08/03/19 2000 08/04/19 0353  NA 141  --   --  144  --  147* 152* 152*  K 3.0*   < >  --  3.1* 3.2* 3.2* 3.9 3.7  CL 94*  --   --  97*  --  98 105 104  CO2 36*  --   --  35*  --  37* 33* 37*  GLUCOSE 272*  --   --  186*  --  195* 110* 165*  BUN 63*  --   --  68*  --  78* 78* 77*  CREATININE 1.74*  --   --  1.59*  --  1.71* 1.56* 1.45*  CALCIUM 8.2*  --   --  8.2*  --  8.6* 8.7* 8.7*  MG 2.1   < > 2.1  --  2.3 2.2 2.5* 2.4  PHOS  --   --  3.2  --  3.2 2.4* 4.0 3.3   < > = values in this interval not displayed.   GFR: Estimated Creatinine Clearance: 83.2 mL/min (A) (by C-G formula based on SCr of 1.45 mg/dL (H)). Recent Labs  Lab 07/30/19 1718 07/30/19 2155 07/30/19 2156 07/31/19 0520 07/31/19 1759 07/31/19 1800 08/01/19 0528 08/02/19 0355 08/02/19 0421 08/03/19 0348  PROCALCITON  --    < >  --   --   --  8.04 8.16 5.82  --  4.21  WBC 14.4*  --   --  15.2*  --   --  10.6*  --  9.7  --   LATICACIDVEN 6.0*  --  4.5*  --  1.7  --   --   --   --   --    < > = values in this interval not displayed.    Liver  Function Tests: No results for input(s): AST, ALT, ALKPHOS, BILITOT, PROT, ALBUMIN in the last 168 hours. Recent Labs  Lab 07/30/19 2155  LIPASE 27   No results for input(s): AMMONIA in the last 168 hours.  ABG  Component Value Date/Time   PHART 7.340 (L) 07/31/2019 0516   PCO2ART 57.1 (H) 07/31/2019 0516   PO2ART 172 (H) 07/31/2019 0516   HCO3 30.6 (H) 07/31/2019 0516   TCO2 32 07/31/2019 0516   ACIDBASEDEF 1.0 07/30/2019 1724   O2SAT 99.0 07/31/2019 0516     Coagulation Profile: No results for input(s): INR, PROTIME in the last 168 hours.  Cardiac Enzymes: No results for input(s): CKTOTAL, CKMB, CKMBINDEX, TROPONINI in the last 168 hours.  HbA1C: Hgb A1c MFr Bld  Date/Time Value Ref Range Status  08/03/2019 01:22 PM 14.9 (H) 4.8 - 5.6 % Final    Comment:    (NOTE) Pre diabetes:          5.7%-6.4%  Diabetes:              >6.4%  Glycemic control for   <7.0% adults with diabetes   03/04/2019 07:08 AM 16.6 (H) 4.8 - 5.6 % Final    Comment:    (NOTE) Pre diabetes:          5.7%-6.4% Diabetes:              >6.4% Glycemic control for   <7.0% adults with diabetes     CBG: Recent Labs  Lab 08/03/19 1951 08/03/19 2345 08/04/19 0120 08/04/19 0456 08/04/19 0822  GLUCAP 122* 109* 171* 132* 123*    Review of Systems:   Not able   Past Medical History  He,  has a past medical history of Asthma, Chronic respiratory failure with hypoxia ([place]) (07/09/2017), COPD (chronic obstructive pulmonary disease) ([place]), Diabetes mellitus without complication ([place]), Difficult intubation, Hypertension, Shortness of breath dyspnea, and Sleep apnea.   Surgical History    Past Surgical History:  Procedure Laterality Date  . IR GASTROSTOMY TUBE MOD SED  07/05/2017  . TRACHEOSTOMY TUBE PLACEMENT N/A 06/17/2017   Procedure: TRACHEOSTOMY;  Surgeon: Leta Baptist, MD;  Location: [facility] OR;  Service: ENT;  Laterality: N/A;     Social History   reports that he quit smoking about 6 years  ago. His smoking use included cigarettes. He started smoking about 36 years ago. He has a 0.25 pack-year smoking history. He has never used smokeless tobacco. He reports that he does not drink alcohol and does not use drugs.   Family History   His family history includes Asthma in his sister and another family member; Diabetes type II in his sister; Hypertension in his sister.   Allergies No Known Allergies   Home Medications  Prior to Admission medications   Medication Sig Start Date End Date Taking? Authorizing Provider  allopurinol (ZYLOPRIM) 100 MG tablet Take 100 mg by mouth daily.    [provider]  atorvastatin (LIPITOR) 10 MG tablet Take 10 mg by mouth daily.    [provider]  budesonide-formoterol (SYMBICORT) 160-4.5 MCG/ACT inhaler Inhale 2 puffs then rinse mouth, twice daiy Patient taking differently: Inhale 1 puff into the lungs 2 (two) times daily. Rinse mouth after use 01/24/19   Young, Tarri Fuller D, MD  desonide (DESOWEN) 0.05 % lotion Apply 1 application topically daily. Apply to face    [provider]  insulin aspart (NOVOLOG FLEXPEN) 100 UNIT/ML FlexPen Inject 8 Units into the skin 3 (three) times daily with meals. 03/06/19   Marianna Payment, MD  insulin glargine (LANTUS) 100 unit/mL SOPN Inject 0.35 mLs (35 Units total) into the skin at bedtime. 03/06/19 04/05/19  Marianna Payment, MD  Insulin Pen Needle (BD ULTRA-FINE PEN NEEDLES) 29G X 12.7MM  MISC USE AS DIRECTED WITH LANTUS 12/23/17   [provider]  ketoconazole (NIZORAL) 2 % cream Apply 1 application topically daily. Apply to wounds    [provider]  metFORMIN (GLUCOPHAGE) 1000 MG tablet Take 1 tablet (1,000 mg total) by mouth 2 (two) times daily with a meal. 03/06/19 03/05/20  Chundi, Verne Spurr, MD  metolazone (ZAROXOLYN) 2.5 MG tablet Take 2.5 mg by mouth daily.    [provider]  potassium chloride SA (K-DUR,KLOR-CON) 20 MEQ tablet Take 1 tablet (20 mEq total) by mouth daily.  07/29/17   Angiulli, Lavon Paganini, PA-C  sertraline (ZOLOFT) 100 MG tablet Take 100 mg by mouth daily.     [provider]  torsemide (DEMADEX) 5 MG tablet Take 5 mg by mouth daily. 01/17/19   [provider]     Critical care time:       Gladys Damme, MD [place] [facility], PGY-2

## 2019-08-04 NOTE — TOC CAGE-AID Note (Signed)
Transition of Care [facility]) - CAGE-AID Screening   Patient Details  Name: Timothy Le MRN: [id number] Date of Birth: March 24, 1960  [facility]) CM/SW Contact:    Emeterio Reeve, [place] Phone Number: 08/04/2019, 4:29 PM   Clinical Narrative:  Pt was unable to participate in assessment due to being on vent.   CAGE-AID Screening: Substance Abuse Screening unable to be completed due to: : Patient unable to participate                   [place] Clinical Social Worker [phone]

## 2019-08-04 NOTE — Progress Notes (Signed)
Pt. Found on peep of 16. Sating 96%

## 2019-08-04 NOTE — Progress Notes (Signed)
Verbal order by MD to extubate patient. RN at bedside when patient extubated. Patient sats quickly dropped into the 70s and WOB breathing increased. MD, NP, Resident all at bedside. Multiple intubation attempts. Emergency bedside trach performed by Dr. Grandville Silos. Multiple bronchs performed throughout attempts.[name] ties applied. Patient bagged and placed back on ventilator. Patient bronched after Xray with blood cleaned out. Patient stable at this time with rising O2 sats. RT will continue to monitor.

## 2019-08-04 NOTE — Progress Notes (Signed)
Pharmacy Antibiotic Note  Timothy Le is a 59 y.o. male admitted on 07/30/2019 with PNA/resp failure, s/p emergent trach at bedside today. Pharmacy has been consulted to broaden antibiotics to Zosyn.  Plan: - D/c Rocephin and Azithro - Start Zosyn 3.375g IV every 8 hours (infused over 4 hours) - Will continue to follow renal function, culture results, LOT, and antibiotic de-escalation plans   Height: 6' (182.9 cm) Weight: (!) 145.4 kg (320 lb 8.8 oz) IBW/kg (Calculated) : 77.6  Temp (24hrs), Avg:99.5 F (37.5 C), Min:98.7 F (37.1 C), Max:100.7 F (38.2 C)  Recent Labs  Lab 07/30/19 1718 07/30/19 2156 07/30/19 2258 07/31/19 0520 07/31/19 0946 07/31/19 1759 08/01/19 0528 08/01/19 0528 08/01/19 2202 08/02/19 0421 08/03/19 0348 08/03/19 2000 08/04/19 0353  WBC 14.4*  --   --  15.2*  --   --  10.6*  --   --  9.7  --   --   --   CREATININE 1.81*  1.50*  --    < > 2.34*   < >  --  1.90*   < > 1.74* 1.59* 1.71* 1.56* 1.45*  LATICACIDVEN 6.0* 4.5*  --   --   --  1.7  --   --   --   --   --   --   --    < > = values in this interval not displayed.    Estimated Creatinine Clearance: 83.2 mL/min (A) (by C-G formula based on SCr of 1.45 mg/dL (H)).    No Known Allergies  Antimicrobials this admission: 7/13 MRSA >> neg 7/13 resp - normal flora  Dose adjustments this admission: n/a  Microbiology results: Ceftriaxone 7/13>>7/16 Azithro 7/13>>7/16 Vanc 7/16 x 1 Zosyn 7/16 >>  Thank you for allowing pharmacy to be a part of this patient's care.  Alycia Rossetti, PharmD, BCPS Clinical Pharmacist Clinical phone for 08/04/2019: [phone] 08/04/2019 2:17 PM   **Pharmacist phone directory can now be found on [email or web address] ([email or web address]).  Listed under [place].

## 2019-08-04 NOTE — Procedures (Signed)
Operative note  Preoperative diagnosis: Acute on chronic respiratory failure and need for airway Postoperative diagnosis: Acute on chronic respiratory failure and need for airway Procedure: Emergent tracheostomy #6 Shiley Surgeon: Georganna Skeans, MD Assistant: Erskine Emery, MD  Procedure in detail: I was called emergently to the bedside to assist with tracheostomy in this patient with acute on chronic respiratory failure and a history of tracheal stenosis and previous tracheostomy.  On my arrival, he did not have an airway.  While the critical care medicine team continue to try to intubate him from above, I extended the previous midline incision on his neck and dissected down to his trachea bluntly.  He has a very large neck.  I was able to access the airway with an Angiocath followed by guidewire.  Unfortunately, due to his severe chronic scarring, I could not sequentially dilate in a standard [place] fashion.  Identical made a tracheotomy incision extending from the percutaneous location and used to sequential dilatations.  I placed a #6 Shiley in the airway over one of the dilators under direct palpation.  It was immediately hooked to the circuit.  Dr. Tamala Julian was able to confirm its location in the trachea with bronchoscopy.  The patient saturations improved and he was able to be transitioned to the ventilator.  A stat portable chest x-ray is pending.  Georganna Skeans, MD, MPH, FACS Please use [email or web address] to contact on call provider

## 2019-08-05 ENCOUNTER — Inpatient Hospital Stay (HOSPITAL_COMMUNITY): Payer: Medicaid Other

## 2019-08-05 LAB — BASIC METABOLIC PANEL
Anion gap: 11 (ref 5–15)
BUN: 93 mg/dL — ABNORMAL HIGH (ref 6–20)
CO2: 34 mmol/L — ABNORMAL HIGH (ref 22–32)
Calcium: 8.3 mg/dL — ABNORMAL LOW (ref 8.9–10.3)
Chloride: 106 mmol/L (ref 98–111)
Creatinine, Ser: 1.94 mg/dL — ABNORMAL HIGH (ref 0.61–1.24)
GFR calc Af Amer: 43 mL/min — ABNORMAL LOW (ref >60)
GFR calc non Af Amer: 37 mL/min — ABNORMAL LOW (ref >60)
Glucose, Bld: 237 mg/dL — ABNORMAL HIGH (ref 70–99)
Potassium: 3.9 mmol/L (ref 3.5–5.1)
Sodium: 151 mmol/L — ABNORMAL HIGH (ref 135–145)

## 2019-08-05 LAB — CBC
HCT: 35.2 % — ABNORMAL LOW (ref 39.0–52.0)
Hemoglobin: 10.4 g/dL — ABNORMAL LOW (ref 13.0–17.0)
MCH: 28.7 pg (ref 26.0–34.0)
MCHC: 29.5 g/dL — ABNORMAL LOW (ref 30.0–36.0)
MCV: 97 fL (ref 80.0–100.0)
Platelets: 165 10*3/uL (ref 150–400)
RBC: 3.63 MIL/uL — ABNORMAL LOW (ref 4.22–5.81)
RDW: 16.3 % — ABNORMAL HIGH (ref 11.5–15.5)
WBC: 7 10*3/uL (ref 4.0–10.5)
nRBC: 0 % (ref 0.0–0.2)

## 2019-08-05 LAB — GLUCOSE, CAPILLARY
Glucose-Capillary: 120 mg/dL — ABNORMAL HIGH (ref 70–99)
Glucose-Capillary: 137 mg/dL — ABNORMAL HIGH (ref 70–99)
Glucose-Capillary: 165 mg/dL — ABNORMAL HIGH (ref 70–99)
Glucose-Capillary: 167 mg/dL — ABNORMAL HIGH (ref 70–99)
Glucose-Capillary: 174 mg/dL — ABNORMAL HIGH (ref 70–99)
Glucose-Capillary: 179 mg/dL — ABNORMAL HIGH (ref 70–99)
Glucose-Capillary: 217 mg/dL — ABNORMAL HIGH (ref 70–99)
Glucose-Capillary: 223 mg/dL — ABNORMAL HIGH (ref 70–99)

## 2019-08-05 LAB — MAGNESIUM
Magnesium: 2.4 mg/dL (ref 1.7–2.4)
Magnesium: 2.3 mg/dL (ref 1.7–2.4)

## 2019-08-05 LAB — PHOSPHORUS
Phosphorus: 4 mg/dL (ref 2.5–4.6)
Phosphorus: 3.1 mg/dL (ref 2.5–4.6)

## 2019-08-05 LAB — TRIGLYCERIDES: Triglycerides: 268 mg/dL — ABNORMAL HIGH (ref <150)

## 2019-08-05 MED ORDER — INSULIN DETEMIR 100 UNIT/ML ~~LOC~~ SOLN
45.0000 [IU] | Freq: Once | SUBCUTANEOUS | Status: AC
  Administered 2019-08-05: 45 [IU] via SUBCUTANEOUS
  Filled 2019-08-05: qty 0.45

## 2019-08-05 MED ORDER — FREE WATER
250.0000 mL | Status: DC
  Administered 2019-08-05 – 2019-08-07 (×14): 250 mL

## 2019-08-05 MED ORDER — METOLAZONE 5 MG PO TABS
2.5000 mg | ORAL_TABLET | Freq: Every day | ORAL | Status: DC
  Administered 2019-08-08 – 2019-08-16 (×9): 2.5 mg
  Filled 2019-08-05 (×9): qty 1

## 2019-08-05 NOTE — Progress Notes (Signed)
Patient ID: Timothy Le, male   DOB: Oct 20, 1960, 59 y.o.   MRN: [id number] [facility] Surgery Progress Note:   * No surgery found *  Subjective: Mental status is sedated on vent.  Complaints none. Objective: Vital signs in last 24 hours: Temp:  [99.6 F (37.6 C)-100.7 F (38.2 C)] 100 F (37.8 C) (07/17 0807) Pulse Rate:  [48-104] 61 (07/17 0800) Resp:  [26-30] 30 (07/17 0800) BP: (95-150)/(50-81) 100/56 (07/17 0800) SpO2:  [37 %-100 %] 99 % (07/17 0800) FiO2 (%):  [50 %-100 %] 50 % (07/17 0800) Weight:  [144.2 kg] 144.2 kg (07/17 0423)  Intake/Output from previous day: 07/16 0701 - 07/17 0700 In: 3408.5 [I.V.:1540.3; NG/GT:1460; IV Piggyback:408.2] Out: 1230 [Urine:1230] Intake/Output this shift: Total I/O In: 142.1 [I.V.:59.6; NG/GT:70; IV Piggyback:12.4] Out: -   Physical Exam: Work of breathing is controlled by vent;  Trach in place;  No bleeding or apparent leaks  Lab Results:  Results for orders placed or performed during the hospital encounter of 07/30/19 (from the past 48 hour(s))  Glucose, capillary     Status: Abnormal   Collection Time: 08/03/19 10:48 AM  Result Value Ref Range   Glucose-Capillary 202 (H) 70 - 99 mg/dL    Comment: Glucose reference range applies only to samples taken after fasting for at least 8 hours.  Glucose, capillary     Status: Abnormal   Collection Time: 08/03/19 11:51 AM  Result Value Ref Range   Glucose-Capillary 173 (H) 70 - 99 mg/dL    Comment: Glucose reference range applies only to samples taken after fasting for at least 8 hours.  Glucose, capillary     Status: Abnormal   Collection Time: 08/03/19  1:10 PM  Result Value Ref Range   Glucose-Capillary 187 (H) 70 - 99 mg/dL    Comment: Glucose reference range applies only to samples taken after fasting for at least 8 hours.  Hemoglobin A1c     Status: Abnormal   Collection Time: 08/03/19  1:22 PM  Result Value Ref Range   Hgb A1c MFr Bld 14.9 (H) 4.8 - 5.6 %    Comment:  (NOTE) Pre diabetes:          5.7%-6.4%  Diabetes:              >6.4%  Glycemic control for   <7.0% adults with diabetes    Mean Plasma Glucose 380.93 mg/dL    Comment: Performed at [facility] [address], [place], [place] [address]  Glucose, capillary     Status: Abnormal   Collection Time: 08/03/19  4:59 PM  Result Value Ref Range   Glucose-Capillary 160 (H) 70 - 99 mg/dL    Comment: Glucose reference range applies only to samples taken after fasting for at least 8 hours.  Glucose, capillary     Status: Abnormal   Collection Time: 08/03/19  7:51 PM  Result Value Ref Range   Glucose-Capillary 122 (H) 70 - 99 mg/dL    Comment: Glucose reference range applies only to samples taken after fasting for at least 8 hours.  Magnesium     Status: Abnormal   Collection Time: 08/03/19  8:00 PM  Result Value Ref Range   Magnesium 2.5 (H) 1.7 - 2.4 mg/dL    Comment: Performed at [facility] [address] [place], [place] [address]  Phosphorus     Status: None   Collection Time: 08/03/19  8:00 PM  Result Value Ref Range  Phosphorus 4.0 2.5 - 4.6 mg/dL    Comment: Performed at [facility], [place] [address], [place], [place] [address]  Basic metabolic panel Once     Status: Abnormal   Collection Time: 08/03/19  8:00 PM  Result Value Ref Range   Sodium 152 (H) 135 - 145 mmol/L   Potassium 3.9 3.5 - 5.1 mmol/L   Chloride 105 98 - 111 mmol/L   CO2 33 (H) 22 - 32 mmol/L   Glucose, Bld 110 (H) 70 - 99 mg/dL    Comment: Glucose reference range applies only to samples taken after fasting for at least 8 hours.   BUN 78 (H) 6 - 20 mg/dL   Creatinine, Ser 1.56 (H) 0.61 - 1.24 mg/dL   Calcium 8.7 (L) 8.9 - 10.3 mg/dL   GFR calc non Af Amer 49 (L) >60 mL/min   GFR calc Af Amer 56 (L) >60 mL/min   Anion gap 14 5 - 15    Comment: Performed at [facility] [address] [place], [place] [address]  Glucose, capillary     Status: Abnormal   Collection Time: 08/03/19  11:45 PM  Result Value Ref Range   Glucose-Capillary 109 (H) 70 - 99 mg/dL    Comment: Glucose reference range applies only to samples taken after fasting for at least 8 hours.  Glucose, capillary     Status: Abnormal   Collection Time: 08/04/19  1:20 AM  Result Value Ref Range   Glucose-Capillary 171 (H) 70 - 99 mg/dL    Comment: Glucose reference range applies only to samples taken after fasting for at least 8 hours.  Magnesium     Status: None   Collection Time: 08/04/19  3:53 AM  Result Value Ref Range   Magnesium 2.4 1.7 - 2.4 mg/dL    Comment: Performed at [facility] [address], [place], [place] [address]  Phosphorus     Status: None   Collection Time: 08/04/19  3:53 AM  Result Value Ref Range   Phosphorus 3.3 2.5 - 4.6 mg/dL    Comment: Performed at [facility] [address], [place], [place] [address]  Triglycerides     Status: Abnormal   Collection Time: 08/04/19  3:53 AM  Result Value Ref Range   Triglycerides 180 (H) <150 mg/dL    Comment: Performed at [facility] [address], [place], [place] [address]  Basic metabolic panel Once     Status: Abnormal   Collection Time: 08/04/19  3:53 AM  Result Value Ref Range   Sodium 152 (H) 135 - 145 mmol/L   Potassium 3.7 3.5 - 5.1 mmol/L   Chloride 104 98 - 111 mmol/L   CO2 37 (H) 22 - 32 mmol/L   Glucose, Bld 165 (H) 70 - 99 mg/dL    Comment: Glucose reference range applies only to samples taken after fasting for at least 8 hours.   BUN 77 (H) 6 - 20 mg/dL   Creatinine, Ser 1.45 (H) 0.61 - 1.24 mg/dL   Calcium 8.7 (L) 8.9 - 10.3 mg/dL   GFR calc non Af Amer 53 (L) >60 mL/min   GFR calc Af Amer >60 >60 mL/min   Anion gap 11 5 - 15    Comment: Performed at [facility] [address], [place], [place] [address]  Glucose, capillary     Status: Abnormal   Collection Time: 08/04/19  4:56 AM  Result  Value Ref Range   Glucose-Capillary 132 (H) 70 - 99 mg/dL    Comment: Glucose reference  range applies only to samples taken after fasting for at least 8 hours.  Glucose, capillary     Status: Abnormal   Collection Time: 08/04/19  8:22 AM  Result Value Ref Range   Glucose-Capillary 123 (H) 70 - 99 mg/dL    Comment: Glucose reference range applies only to samples taken after fasting for at least 8 hours.  Glucose, capillary     Status: Abnormal   Collection Time: 08/04/19  1:04 PM  Result Value Ref Range   Glucose-Capillary 202 (H) 70 - 99 mg/dL    Comment: Glucose reference range applies only to samples taken after fasting for at least 8 hours.  Glucose, capillary     Status: Abnormal   Collection Time: 08/04/19  4:31 PM  Result Value Ref Range   Glucose-Capillary 106 (H) 70 - 99 mg/dL    Comment: Glucose reference range applies only to samples taken after fasting for at least 8 hours.  Glucose, capillary     Status: None   Collection Time: 08/04/19  7:45 PM  Result Value Ref Range   Glucose-Capillary 80 70 - 99 mg/dL    Comment: Glucose reference range applies only to samples taken after fasting for at least 8 hours.  Glucose, capillary     Status: None   Collection Time: 08/04/19  9:42 PM  Result Value Ref Range   Glucose-Capillary 92 70 - 99 mg/dL    Comment: Glucose reference range applies only to samples taken after fasting for at least 8 hours.  Glucose, capillary     Status: Abnormal   Collection Time: 08/05/19 12:11 AM  Result Value Ref Range   Glucose-Capillary 120 (H) 70 - 99 mg/dL    Comment: Glucose reference range applies only to samples taken after fasting for at least 8 hours.  Glucose, capillary     Status: Abnormal   Collection Time: 08/05/19  3:46 AM  Result Value Ref Range   Glucose-Capillary 165 (H) 70 - 99 mg/dL    Comment: Glucose reference range applies only to samples taken after fasting for at least 8 hours.  Magnesium     Status: None   Collection Time: 08/05/19  5:53 AM  Result Value Ref Range   Magnesium 2.4 1.7 - 2.4 mg/dL    Comment:  Performed at [facility] [address], [place], [place] [address]  Phosphorus     Status: None   Collection Time: 08/05/19  5:53 AM  Result Value Ref Range   Phosphorus 4.0 2.5 - 4.6 mg/dL    Comment: Performed at [facility] [address] [place], [place] [address]  Triglycerides     Status: Abnormal   Collection Time: 08/05/19  5:53 AM  Result Value Ref Range   Triglycerides 268 (H) <150 mg/dL    Comment: Performed at [facility] [address], [place], [place] [address]  Basic metabolic panel Once     Status: Abnormal   Collection Time: 08/05/19  5:53 AM  Result Value Ref Range   Sodium 151 (H) 135 - 145 mmol/L   Potassium 3.9 3.5 - 5.1 mmol/L   Chloride 106 98 - 111 mmol/L   CO2 34 (H) 22 - 32 mmol/L   Glucose, Bld 237 (H) 70 - 99 mg/dL    Comment: Glucose reference range applies only to samples taken after fasting for  at least 8 hours.   BUN 93 (H) 6 - 20 mg/dL   Creatinine, Ser 1.94 (H) 0.61 - 1.24 mg/dL   Calcium 8.3 (L) 8.9 - 10.3 mg/dL   GFR calc non Af Amer 37 (L) >60 mL/min   GFR calc Af Amer 43 (L) >60 mL/min   Anion gap 11 5 - 15    Comment: Performed at [facility] [address], [place],  [address]  CBC     Status: Abnormal   Collection Time: 08/05/19  5:53 AM  Result Value Ref Range   WBC 7.0 4.0 - 10.5 K/uL   RBC 3.63 (L) 4.22 - 5.81 MIL/uL   Hemoglobin 10.4 (L) 13.0 - 17.0 g/dL   HCT 35.2 (L) 39 - 52 %   MCV 97.0 80.0 - 100.0 fL   MCH 28.7 26.0 - 34.0 pg   MCHC 29.5 (L) 30.0 - 36.0 g/dL   RDW 16.3 (H) 11.5 - 15.5 %   Platelets 165 150 - 400 K/uL   nRBC 0.0 0.0 - 0.2 %    Comment: Performed at [facility], [place] [address], [place], [place] [address]  Glucose, capillary     Status: Abnormal   Collection Time: 08/05/19  6:15 AM  Result Value Ref Range   Glucose-Capillary 223 (H) 70 - 99 mg/dL    Comment: Glucose reference range applies only to samples taken after fasting for at least 8 hours.  Glucose,  capillary     Status: Abnormal   Collection Time: 08/05/19  8:11 AM  Result Value Ref Range   Glucose-Capillary 217 (H) 70 - 99 mg/dL    Comment: Glucose reference range applies only to samples taken after fasting for at least 8 hours.    Radiology/Results: DG CHEST PORT 1 VIEW  Result Date: 08/04/2019 CLINICAL DATA:  Tracheostomy. EXAM: PORTABLE CHEST 1 VIEW COMPARISON:  08/04/2019 at 5:07 a.m. FINDINGS: Endotracheal and enteric tubes have been removed. A tracheostomy tube has been placed and projects over the airway. A right jugular catheter remains in place projecting over the SVC. There is new complete opacification of the right hemithorax with mild rightward mediastinal shift. The left lung is grossly clear. No pneumothorax is identified. IMPRESSION: 1. Interval tracheostomy. 2. New complete opacification of the right hemithorax likely reflecting atelectasis. Per the EMR, bronchoscopy has been performed with aspiration of clot in the right mainstem bronchus. Electronically Signed   By: Logan Bores M.D.   On: 08/04/2019 13:15   DG CHEST PORT 1 VIEW  Result Date: 08/04/2019 CLINICAL DATA:  Endotracheal tube placement. EXAM: PORTABLE CHEST 1 VIEW COMPARISON:  08/03/2019 FINDINGS: Endotracheal tube tip is approximately 6.5 cm above the base of the carina. Right IJ central line tip overlies the mid SVC level. The NG tube passes into the stomach although the distal tip position is not included on the film. Cardiopericardial silhouette is at upper limits of normal for size. There is pulmonary vascular congestion without overt pulmonary edema. Bibasilar collapse/consolidative opacity is similar to prior. No substantial pleural effusion. Telemetry leads overlie the chest. IMPRESSION: 1. Endotracheal tube tip is approximately 6.5 cm above the base of the carina. 2. Persistent bibasilar collapse/consolidative opacity. Electronically Signed   By: Misty Stanley M.D.   On: 08/04/2019 07:55   DG Abd Portable  1V  Result Date: 08/04/2019 CLINICAL DATA:  Nasogastric tube placement. EXAM: PORTABLE ABDOMEN - 1 VIEW COMPARISON:  July 31, 2019. FINDINGS: Nasogastric tube tip is seen in  proximal stomach. No abnormal bowel dilatation is noted. IMPRESSION: Nasogastric tube tip seen in proximal stomach. Electronically Signed   By: Marijo Conception M.D.   On: 08/04/2019 17:22    Anti-infectives: Anti-infectives (From admission, onward)   Start     Dose/Rate Route Frequency Ordered Stop   08/04/19 2200  piperacillin-tazobactam (ZOSYN) IVPB 3.375 g     Discontinue     3.375 g 12.5 mL/hr over 240 Minutes Intravenous Every 8 hours 08/04/19 1413     08/04/19 1300  vancomycin (VANCOREADY) IVPB 1750 mg/350 mL        1,750 mg 175 mL/hr over 120 Minutes Intravenous  Once 08/04/19 1249 08/04/19 1631   08/01/19 1230  cefTRIAXone (ROCEPHIN) 2 g in sodium chloride 0.9 % 100 mL IVPB  Status:  Discontinued        2 g 200 mL/hr over 30 Minutes Intravenous Every 24 hours 08/01/19 1226 08/04/19 1413   08/01/19 1230  azithromycin (ZITHROMAX) 500 mg in sodium chloride 0.9 % 250 mL IVPB  Status:  Discontinued        500 mg 250 mL/hr over 60 Minutes Intravenous Every 24 hours 08/01/19 1226 08/04/19 1413      Assessment/Plan: Problem List: Patient Active Problem List   Diagnosis Date Noted   Acute on chronic respiratory failure ([facility]) 07/30/2019   Acute respiratory failure ([facility]) 07/30/2019   Acute respiratory failure with hypoxia and hypercapnia ([facility])    Nodule of soft tissue 01/24/2019   Benign essential HTN    Hyperlipidemia    Debility 07/15/2017   Chronic respiratory failure with hypoxia ([facility]) 07/09/2017   Acute pulmonary edema ([facility])    Difficult airway for intubation    Obesity hypoventilation syndrome ([facility]) 06/09/2017   Seasonal and perennial allergic rhinitis 05/24/2016   AKI (acute kidney injury) ([facility]) 04/08/2015   Hyperosmolar hyperglycemic state (HHS) ([facility]) 03/28/2015   Type 2 diabetes  mellitus with hyperosmolar nonketotic hyperglycemia ([facility]) 03/28/2015   COPD mixed type ([facility]) 12/27/2014   Severe obesity (BMI >= 40) ([facility]) 12/27/2014   Diabetes mellitus type 2, controlled ([facility]) 12/27/2014    Class: Chronic   OSA (obstructive sleep apnea) 07/29/2013   Peripheral edema 07/29/2013    Will sign off of trach done for emergency reasons yesterday.   * No surgery found *    LOS: 6 days   Matt B. Hassell Done, MD, [facility] [phone] to reach the surgeon on call.    08/05/2019 10:44 AM

## 2019-08-05 NOTE — Progress Notes (Signed)
NAME:  Timothy Le, MRN:  [id number], DOB:  May 07, 1960, LOS: 6 ADMISSION DATE:  07/30/2019, CONSULTATION DATE:  07/30/2010 REFERRING MD:  Mariane Masters, CHIEF COMPLAINT:  Acute respiratory failure   Brief History   59 year old black male with baseline chronic respiratory failure on the basis of severe OHS/OSA complicated further by significant obstructive disease in the setting of COPD.  Presents with acute on chronic hypoxic and hypercarbic respiratory failure.  Admitted with the initial working diagnosis of pulmonary edema and decompensated diastolic heart failure. Per family patient was without CPAP for 2 weeks PTA as his machine broke.  History of present illness   59 year old male patient known to pulmonary division followed for chronic respiratory failure on the basis of severe COPD, as well as significant restrictive disease by obesity hypoventilation and obstructive sleep apnea.  He is managed on BiPAP, after successfully having significant weight loss which allowed him to have tracheostomy removed following successful titration study.  He has been followed by Dr. Annamaria Boots in the outpatient setting last seen in May.  And had been doing well followed by a nutritionist and walking daily. Presents to the emergency room via EMS today on 7/11.  Apparently had been taking garbage out, on return to the house was acutely short of breath with significant wheezing.  He tried bronchodilator, also placed himself on his home BiPAP device without improvement EMS was therefore called fire arrived first finding him minimally responsive he was manually assisted ventilation with bag valve mask, given albuterol nebulizer and transferred to the emergency room.  He was unresponsive on presentation and was intubated for airway protection, marked hypoxia, and presumptive hypercarbia.  After airway was stabilized The following initial diagnostic data was identified: White blood cell count 14.4, initial venous blood gas PCO2  64, bicarbonate 28, pH 7.25, follow-up intubation arterial blood gas pH 7.22, PCO2 74, PO2 40 bicarbonate 30, pulse oximetry 63%.  Portable chest x-ray with diffuse pulmonary edema endotracheal tube in satisfactory position.  On i-STAT chemistry creatinine 1.5 up from baseline of 1.35-1.43 glucose 652. Critical Care admitted.  Past Medical History  Past medical history of Asthma, Chronic respiratory failure with hypoxia ([place]) (07/09/2017), COPD (chronic obstructive pulmonary disease) ([place]), Diabetes mellitus without complication ([place]), Difficult intubation, Hypertension, Shortness of breath dyspnea, and Sleep apnea.   Significant Hospital Events   On the night of 7/15-7/16 the patient had one event of desaturation to 70s, resolved with suctioning by RT and RN. Na increased from 147-152, free water flushes increased to 60 mL/hr. Febrile this morning to 100.7*F at 0830. Peep decreased to 8, saturations stable at 100%, FiO2 40%.  On 7/16, the patient was extubated but subsequently became stridorous. Attempted re-intubation was unsuccessful, even via FOB such that the patient had an #6 Shiley emergent trach placed by Trauma Surg. This was followed by atelectasis of the right lung, from which a blood clot was removed via FOB.  Consults:  Trauma Surg ENT  Procedures:  Endotracheal tube 7/11 Right IJ triple-lumen catheter 7/11 Trach 7/16  Significant Diagnostic Tests:  Echocardiogram 7/11 >> no evidence of RV strain; LVEF 50-55%.Left ventricular diastolic function could not be evaluated.  Lower extremity ultrasound 7/11 >> no evidence of lower extremity DVT  Micro Data:  7/13 trach aspirate - nl resp. flora  Antimicrobials:  Pip/tazo - 7/16>   Interim history/subjective:  Patient remains sedated on vent  Objective   Blood pressure 117/64, pulse (!) 59, temperature 100.2 F (37.9 C), temperature source Oral, resp. rate Marland Kitchen)  30, height 6' (1.829 m), weight (!) 144.2 kg, SpO2 100 %.    Vent  Mode: PRVC FiO2 (%):  [50 %-100 %] 50 % Set Rate:  [30 bmp] 30 bmp Vt Set:  [400 mL] 400 mL PEEP:  [16 cmH20-22 cmH20] 16 cmH20 Plateau Pressure:  [22 cmH20-30 cmH20] 24 cmH20   Intake/Output Summary (Last 24 hours) at 08/05/2019 1441 Last data filed at 08/05/2019 1300 Gross per 24 hour  Intake 3425.53 ml  Output 805 ml  Net 2620.53 ml   Filed Weights   08/03/19 0500 08/04/19 0500 08/05/19 0423  Weight: (!) 142.6 kg (!) 145.4 kg (!) 144.2 kg    Examination: General: Obese 59 year old black male intubated and sedated. HENT: [place]/AT; trach in situ Lungs: Decreased but clear Cardiovascular: RRR, no m/r/g Abdomen: Obese with +BS; no guarding Extremities: Tr edema Neuro: Sedated; no focal findings   Resolved Hospital Problem list   Lactic acidosis Mild leukocytosis  Assessment & Plan:  1) Acute on chronic hypoxic and hypercarbic respiratory failure  PFTs 10/11/2018 demonstrated combined obstructive and restrictive ventilatory defects related to COPD and obesity. Will continue bronchodilators and ICS Last dose of Abx today for AECOPD No attempt at weaning today es/p emergency trach placement 2) OSA Patient has had a prior trach. Subsequent management will depend upon ability to decannulate patient. 3) T2DM with hyperglycemia On Levemir and SSI. TF were held during period of trach placement. Will resume Levemir at 45 to see where he stabilizes with SSI 4) Hypernatremia Patient has an ~6.8L free water deficit. However, with hx diastolic dysfn and CKD will replace free water judiciously 5) Acute on CKD BUN/Cr continues to rise. Will hold metolazone   Best practice:  Diet: Vital TF Pain/Anxiety/Delirium protocol (if indicated): RASS -1, -2 VAP protocol (if indicated): Resume DVT prophylaxis: Enoxaparin GI prophylaxis: PPI Glucose control: Levemir + SSI Mobility: BR Code Status: Full Family Communication: Will update Disposition: ICU  Labs   CBC: Recent Labs  Lab  07/30/19 1718 07/30/19 1724 07/31/19 0516 07/31/19 0520 08/01/19 0528 08/02/19 0421 08/05/19 0553  WBC 14.4*  --   --  15.2* 10.6* 9.7 7.0  NEUTROABS 7.4  --   --   --   --  7.0  --   HGB 14.6   17.0   < > 16.0 14.8 13.0 12.0* 10.4*  HCT 47.1   50.0   < > 47.0 47.7 41.2 38.2* 35.2*  MCV 90.6  --   --  89.2 88.6 91.0 97.0  PLT 295  --   --  202 178 161 165   < > = values in this interval not displayed.    Basic Metabolic Panel: Recent Labs  Lab 08/02/19 0355 08/02/19 0421 08/02/19 1524 08/03/19 0348 08/03/19 2000 08/04/19 0353 08/05/19 0553  NA  --  144  --  147* 152* 152* 151*  K   < > 3.1* 3.2* 3.2* 3.9 3.7 3.9  CL  --  97*  --  98 105 104 106  CO2  --  35*  --  37* 33* 37* 34*  GLUCOSE  --  186*  --  195* 110* 165* 237*  BUN  --  68*  --  78* 78* 77* 93*  CREATININE  --  1.59*  --  1.71* 1.56* 1.45* 1.94*  CALCIUM  --  8.2*  --  8.6* 8.7* 8.7* 8.3*  MG   < >  --  2.3 2.2 2.5* 2.4 2.4  PHOS   < >  --  3.2 2.4* 4.0 3.3 4.0   < > = values in this interval not displayed.   GFR: Estimated Creatinine Clearance: 61.9 mL/min (A) (by C-G formula based on SCr of 1.94 mg/dL (H)). Recent Labs  Lab 07/30/19 1718 07/30/19 1718 07/30/19 2155 07/30/19 2156 07/31/19 0520 07/31/19 1759 07/31/19 1800 08/01/19 0528 08/02/19 0355 08/02/19 0421 08/03/19 0348 08/05/19 0553  PROCALCITON  --   --    < >  --   --   --  8.04 8.16 5.82  --  4.21  --   WBC 14.4*   < >  --   --  15.2*  --   --  10.6*  --  9.7  --  7.0  LATICACIDVEN 6.0*  --   --  4.5*  --  1.7  --   --   --   --   --   --    < > = values in this interval not displayed.    Liver Function Tests: No results for input(s): AST, ALT, ALKPHOS, BILITOT, PROT, ALBUMIN in the last 168 hours. Recent Labs  Lab 07/30/19 2155  LIPASE 27   No results for input(s): AMMONIA in the last 168 hours.  ABG    Component Value Date/Time   PHART 7.340 (L) 07/31/2019 0516   PCO2ART 57.1 (H) 07/31/2019 0516   PO2ART 172 (H)  07/31/2019 0516   HCO3 30.6 (H) 07/31/2019 0516   TCO2 32 07/31/2019 0516   ACIDBASEDEF 1.0 07/30/2019 1724   O2SAT 99.0 07/31/2019 0516     Coagulation Profile: No results for input(s): INR, PROTIME in the last 168 hours.  Cardiac Enzymes: No results for input(s): CKTOTAL, CKMB, CKMBINDEX, TROPONINI in the last 168 hours.  HbA1C: Hgb A1c MFr Bld  Date/Time Value Ref Range Status  08/03/2019 01:22 PM 14.9 (H) 4.8 - 5.6 % Final    Comment:    (NOTE) Pre diabetes:          5.7%-6.4%  Diabetes:              >6.4%  Glycemic control for   <7.0% adults with diabetes   03/04/2019 07:08 AM 16.6 (H) 4.8 - 5.6 % Final    Comment:    (NOTE) Pre diabetes:          5.7%-6.4% Diabetes:              >6.4% Glycemic control for   <7.0% adults with diabetes     CBG: Recent Labs  Lab 08/05/19 0011 08/05/19 0346 08/05/19 0615 08/05/19 0811 08/05/19 1215  GLUCAP 120* 165* 223* 217* 174*    Review of Systems:   Unable to obtain  Past Medical History  He,  has a past medical history of Asthma, Chronic respiratory failure with hypoxia ([place]) (07/09/2017), COPD (chronic obstructive pulmonary disease) ([place]), Diabetes mellitus without complication ([place]), Difficult intubation, Hypertension, Shortness of breath dyspnea, and Sleep apnea.   Surgical History    Past Surgical History:  Procedure Laterality Date   IR GASTROSTOMY TUBE MOD SED  07/05/2017   TRACHEOSTOMY TUBE PLACEMENT N/A 06/17/2017   Procedure: TRACHEOSTOMY;  Surgeon: Leta Baptist, MD;  Location: [facility] OR;  Service: ENT;  Laterality: N/A;     Social History   reports that he quit smoking about 6 years ago. His smoking use included cigarettes. He started smoking about 36 years ago. He has a 0.25 pack-year smoking history. He has never used smokeless tobacco. He reports that he does not drink alcohol and  does not use drugs.   Family History   His family history includes Asthma in his sister and another family member; Diabetes  type II in his sister; Hypertension in his sister.   Allergies No Known Allergies   Home Medications  Prior to Admission medications   Medication Sig Start Date End Date Taking? Authorizing Provider  allopurinol (ZYLOPRIM) 100 MG tablet Take 100 mg by mouth daily.   Yes [provider]  atorvastatin (LIPITOR) 10 MG tablet Take 10 mg by mouth daily.   Yes [provider]  budesonide-formoterol (SYMBICORT) 160-4.5 MCG/ACT inhaler Inhale 2 puffs then rinse mouth, twice daiy Patient taking differently: Inhale 2 puffs into the lungs 2 (two) times daily. Rinse mouth after use 01/24/19  Yes Young, Clinton D, MD  desonide (DESOWEN) 0.05 % lotion Apply 1 application topically daily. Apply to face   Yes [provider]  insulin aspart (NOVOLOG FLEXPEN) 100 UNIT/ML FlexPen Inject 8 Units into the skin 3 (three) times daily with meals. 03/06/19  Yes Marianna Payment, MD  insulin glargine (LANTUS) 100 unit/mL SOPN Inject 0.35 mLs (35 Units total) into the skin at bedtime. 03/06/19 09/07/19 Yes Marianna Payment, MD  ketoconazole (NIZORAL) 2 % cream Apply 1 application topically daily. Apply to wounds   Yes [provider]  metFORMIN (GLUCOPHAGE) 1000 MG tablet Take 1 tablet (1,000 mg total) by mouth 2 (two) times daily with a meal. 03/06/19 03/05/20 Yes Chundi, Vahini, MD  metolazone (ZAROXOLYN) 2.5 MG tablet Take 2.5 mg by mouth daily.   Yes [provider]  potassium chloride SA (K-DUR,KLOR-CON) 20 MEQ tablet Take 1 tablet (20 mEq total) by mouth daily. 07/29/17  Yes Angiulli, Lavon Paganini, PA-C  sertraline (ZOLOFT) 100 MG tablet Take 100 mg by mouth daily.    Yes [provider]  torsemide (DEMADEX) 5 MG tablet Take 5 mg by mouth daily. 01/17/19  Yes [provider]  Insulin Pen Needle (BD ULTRA-FINE PEN NEEDLES) 29G X 12.7MM MISC USE AS DIRECTED WITH LANTUS 12/23/17   [provider]     Critical care time: 6 min  Jamesetta So, M.D.

## 2019-08-06 LAB — BASIC METABOLIC PANEL
Anion gap: 12 (ref 5–15)
BUN: 86 mg/dL — ABNORMAL HIGH (ref 6–20)
CO2: 34 mmol/L — ABNORMAL HIGH (ref 22–32)
Calcium: 8.7 mg/dL — ABNORMAL LOW (ref 8.9–10.3)
Chloride: 104 mmol/L (ref 98–111)
Creatinine, Ser: 1.61 mg/dL — ABNORMAL HIGH (ref 0.61–1.24)
GFR calc Af Amer: 54 mL/min — ABNORMAL LOW (ref >60)
GFR calc non Af Amer: 47 mL/min — ABNORMAL LOW (ref >60)
Glucose, Bld: 215 mg/dL — ABNORMAL HIGH (ref 70–99)
Potassium: 3.4 mmol/L — ABNORMAL LOW (ref 3.5–5.1)
Sodium: 150 mmol/L — ABNORMAL HIGH (ref 135–145)

## 2019-08-06 LAB — GLUCOSE, CAPILLARY
Glucose-Capillary: 200 mg/dL — ABNORMAL HIGH (ref 70–99)
Glucose-Capillary: 182 mg/dL — ABNORMAL HIGH (ref 70–99)
Glucose-Capillary: 157 mg/dL — ABNORMAL HIGH (ref 70–99)
Glucose-Capillary: 149 mg/dL — ABNORMAL HIGH (ref 70–99)
Glucose-Capillary: 177 mg/dL — ABNORMAL HIGH (ref 70–99)

## 2019-08-06 MED ORDER — POTASSIUM CHLORIDE 20 MEQ PO PACK
40.0000 meq | PACK | Freq: Once | ORAL | Status: AC
  Administered 2019-08-06: 40 meq
  Filled 2019-08-06: qty 2

## 2019-08-06 MED ORDER — POTASSIUM CHLORIDE 20 MEQ PO PACK
20.0000 meq | PACK | Freq: Once | ORAL | Status: AC
  Administered 2019-08-06: 20 meq
  Filled 2019-08-06: qty 1

## 2019-08-06 MED ORDER — PANTOPRAZOLE SODIUM 40 MG PO PACK
40.0000 mg | PACK | Freq: Every day | ORAL | Status: DC
  Administered 2019-08-07 – 2019-09-05 (×31): 40 mg
  Filled 2019-08-06 (×30): qty 20

## 2019-08-06 NOTE — Progress Notes (Signed)
NAME:  Timothy Le, MRN:  [id number], DOB:  09-18-1960, LOS: 7 ADMISSION DATE:  07/30/2019, CONSULTATION DATE:  07/30/2010 REFERRING MD:  Mariane Masters, CHIEF COMPLAINT:  Acute respiratory failure   Brief History   59 year old black male with baseline chronic respiratory failure on the basis of severe OHS/OSA complicated further by significant obstructive disease in the setting of COPD.  Presents with acute on chronic hypoxic and hypercarbic respiratory failure.  Admitted with the initial working diagnosis of pulmonary edema and decompensated diastolic heart failure. Per family patient was without CPAP for 2 weeks PTA as his machine broke.  History of present illness   59 year old male patient known to pulmonary division followed for chronic respiratory failure on the basis of severe COPD, as well as significant restrictive disease by obesity hypoventilation and obstructive sleep apnea.  He is managed on BiPAP, after successfully having significant weight loss which allowed him to have tracheostomy removed following successful titration study.  He has been followed by Dr. Annamaria Boots in the outpatient setting last seen in May.  And had been doing well followed by a nutritionist and walking daily. Presents to the emergency room via EMS today on 7/11.  Apparently had been taking garbage out, on return to the house was acutely short of breath with significant wheezing.  He tried bronchodilator, also placed himself on his home BiPAP device without improvement EMS was therefore called fire arrived first finding him minimally responsive he was manually assisted ventilation with bag valve mask, given albuterol nebulizer and transferred to the emergency room.  He was unresponsive on presentation and was intubated for airway protection, marked hypoxia, and presumptive hypercarbia.  After airway was stabilized The following initial diagnostic data was identified: White blood cell count 14.4, initial venous blood gas PCO2  64, bicarbonate 28, pH 7.25, follow-up intubation arterial blood gas pH 7.22, PCO2 74, PO2 40 bicarbonate 30, pulse oximetry 63%.  Portable chest x-ray with diffuse pulmonary edema endotracheal tube in satisfactory position.  On i-STAT chemistry creatinine 1.5 up from baseline of 1.35-1.43 glucose 652. Critical Care admitted.  Past Medical History  Past medical history of Asthma, Chronic respiratory failure with hypoxia ([place]) (07/09/2017), COPD (chronic obstructive pulmonary disease) ([place]), Diabetes mellitus without complication ([place]), Difficult intubation, Hypertension, Shortness of breath dyspnea, and Sleep apnea.   Significant Hospital Events   On the night of 7/15-7/16 the patient had one event of desaturation to 70s, resolved with suctioning by RT and RN. Na increased from 147-152, free water flushes increased to 60 mL/hr. Febrile this morning to 100.7*F at 0830. Peep decreased to 8, saturations stable at 100%, FiO2 40%.  On 7/16, the patient was extubated but subsequently became stridorous. Attempted re-intubation was unsuccessful, even via FOB such that the patient had an #6 Shiley emergent trach placed by Trauma Surg. This was followed by atelectasis of the right lung, from which a blood clot was removed via FOB.  Consults:  Trauma Surg ENT  Procedures:  Endotracheal tube 7/11 Right IJ triple-lumen catheter 7/11 Trach 7/16  Significant Diagnostic Tests:  Echocardiogram 7/11 >> no evidence of RV strain; LVEF 50-55%.Left ventricular diastolic function could not be evaluated.  Lower extremity ultrasound 7/11 >> no evidence of lower extremity DVT  Micro Data:  7/13 trach aspirate - nl resp. flora  Antimicrobials:  Pip/tazo - 7/16>7/18  Interim history/subjective:  Patient remains sedated on vent  Objective   Blood pressure 100/60, pulse (!) 50, temperature 98.3 F (36.8 C), temperature source Oral, resp. rate Marland Kitchen)  30, height 6' (1.829 m), weight (!) 145.6 kg, SpO2 95 %.    Vent  Mode: PRVC FiO2 (%):  [40 %-50 %] 40 % Set Rate:  [30 bmp] 30 bmp Vt Set:  [400 mL] 400 mL PEEP:  [12 cmH20] 12 cmH20 Plateau Pressure:  [10 cmH20-22 cmH20] 20 cmH20   Intake/Output Summary (Last 24 hours) at 08/06/2019 1207 Last data filed at 08/06/2019 1100 Gross per 24 hour  Intake 2413.96 ml  Output 1300 ml  Net 1113.96 ml   Filed Weights   08/04/19 0500 08/05/19 0423 08/06/19 0500  Weight: (!) 145.4 kg (!) 144.2 kg (!) 145.6 kg    Examination: General: Obese 59 year old black male intubated and sedated. HENT: [place]/AT; trach in situ Lungs: Decreased but clear Cardiovascular: RRR, no m/r/g Abdomen: Obese with +BS; no guarding Extremities: Tr edema Neuro: Sedated; no focal findings   Resolved Hospital Problem list   Lactic acidosis Mild leukocytosis  Assessment & Plan:  1) Acute on chronic hypoxic and hypercarbic respiratory failure  PFTs 10/11/2018 demonstrated combined obstructive and restrictive ventilatory defects related to COPD and obesity and thyroid goiter. Will continue bronchodilators and ICS Last dose of Abx today for AECOPD Vent settings changed to PEEP 8-9, FiO2 80%, tolerating well 2) OSA Patient has had a prior trach. Subsequent management will depend upon ability to decannulate patient. 3) T2DM with hyperglycemia On Levemir and SSI. TF were held during period of trach placement. Levemir increased to 55 units today with improvement, cbg's within goal of 120-180, currently 157. 4) Hypernatremia Patient had a fluid deficit of ~6.8L, due to CHF, judiciously replaced fluids with 219mL free water q4h. Now fluid deficit of 3L, Na 150. Only trace edema present, cr improved from 1.93 to 1.61 today. Will continue free water flushes and reassess tomorrow. 5) Acute on CKD BUN/Cr improved today, 93 > 86, 1.94 > 1.61. Will hold metolazone, likely resume tomorrow if continues to improve. 6) Thyroid goiter Causing narrowing of airway. Follow up with ENT for thyroidectomy  prior to decannulation.  Best practice:  Diet: Vital TF Pain/Anxiety/Delirium protocol (if indicated): RASS -1, -2 VAP protocol (if indicated): Resume DVT prophylaxis: Enoxaparin GI prophylaxis: PPI Glucose control: Levemir + SSI Mobility: BR Code Status: Full Family Communication: Will update Disposition: ICU  Labs   CBC: Recent Labs  Lab 07/30/19 1718 07/30/19 1724 07/31/19 0516 07/31/19 0520 08/01/19 0528 08/02/19 0421 08/05/19 0553  WBC 14.4*  --   --  15.2* 10.6* 9.7 7.0  NEUTROABS 7.4  --   --   --   --  7.0  --   HGB 14.6   17.0   < > 16.0 14.8 13.0 12.0* 10.4*  HCT 47.1   50.0   < > 47.0 47.7 41.2 38.2* 35.2*  MCV 90.6  --   --  89.2 88.6 91.0 97.0  PLT 295  --   --  202 178 161 165   < > = values in this interval not displayed.    Basic Metabolic Panel: Recent Labs  Lab 08/03/19 0348 08/03/19 2000 08/04/19 0353 08/05/19 0553 08/05/19 1730 08/06/19 0542  NA 147* 152* 152* 151*  --  150*  K 3.2* 3.9 3.7 3.9  --  3.4*  CL 98 105 104 106  --  104  CO2 37* 33* 37* 34*  --  34*  GLUCOSE 195* 110* 165* 237*  --  215*  BUN 78* 78* 77* 93*  --  86*  CREATININE 1.71* 1.56* 1.45* 1.94*  --  1.61*  CALCIUM 8.6* 8.7* 8.7* 8.3*  --  8.7*  MG 2.2 2.5* 2.4 2.4 2.3  --   PHOS 2.4* 4.0 3.3 4.0 3.1  --    GFR: Estimated Creatinine Clearance: 75 mL/min (A) (by C-G formula based on SCr of 1.61 mg/dL (H)). Recent Labs  Lab 07/30/19 1718 07/30/19 1718 07/30/19 2155 07/30/19 2156 07/31/19 0520 07/31/19 1759 07/31/19 1800 08/01/19 0528 08/02/19 0355 08/02/19 0421 08/03/19 0348 08/05/19 0553  PROCALCITON  --   --    < >  --   --   --  8.04 8.16 5.82  --  4.21  --   WBC 14.4*   < >  --   --  15.2*  --   --  10.6*  --  9.7  --  7.0  LATICACIDVEN 6.0*  --   --  4.5*  --  1.7  --   --   --   --   --   --    < > = values in this interval not displayed.    Liver Function Tests: No results for input(s): AST, ALT, ALKPHOS, BILITOT, PROT, ALBUMIN in the last 168  hours. Recent Labs  Lab 07/30/19 2155  LIPASE 27   No results for input(s): AMMONIA in the last 168 hours.  ABG    Component Value Date/Time   PHART 7.340 (L) 07/31/2019 0516   PCO2ART 57.1 (H) 07/31/2019 0516   PO2ART 172 (H) 07/31/2019 0516   HCO3 30.6 (H) 07/31/2019 0516   TCO2 32 07/31/2019 0516   ACIDBASEDEF 1.0 07/30/2019 1724   O2SAT 99.0 07/31/2019 0516     Coagulation Profile: No results for input(s): INR, PROTIME in the last 168 hours.  Cardiac Enzymes: No results for input(s): CKTOTAL, CKMB, CKMBINDEX, TROPONINI in the last 168 hours.  HbA1C: Hgb A1c MFr Bld  Date/Time Value Ref Range Status  08/03/2019 01:22 PM 14.9 (H) 4.8 - 5.6 % Final    Comment:    (NOTE) Pre diabetes:          5.7%-6.4%  Diabetes:              >6.4%  Glycemic control for   <7.0% adults with diabetes   03/04/2019 07:08 AM 16.6 (H) 4.8 - 5.6 % Final    Comment:    (NOTE) Pre diabetes:          5.7%-6.4% Diabetes:              >6.4% Glycemic control for   <7.0% adults with diabetes     CBG: Recent Labs  Lab 08/05/19 1946 08/05/19 2320 08/06/19 0334 08/06/19 0820 08/06/19 1144  GLUCAP 137* 179* 200* 182* 157*    Review of Systems:   Unable to obtain  Past Medical History  He,  has a past medical history of Asthma, Chronic respiratory failure with hypoxia ([place]) (07/09/2017), COPD (chronic obstructive pulmonary disease) ([place]), Diabetes mellitus without complication ([place]), Difficult intubation, Hypertension, Shortness of breath dyspnea, and Sleep apnea.   Surgical History    Past Surgical History:  Procedure Laterality Date   IR GASTROSTOMY TUBE MOD SED  07/05/2017   TRACHEOSTOMY TUBE PLACEMENT N/A 06/17/2017   Procedure: TRACHEOSTOMY;  Surgeon: Leta Baptist, MD;  Location: [facility] OR;  Service: ENT;  Laterality: N/A;     Social History   reports that he quit smoking about 6 years ago. His smoking use included cigarettes. He started smoking about 36 years ago. He has a 0.25  pack-year smoking  history. He has never used smokeless tobacco. He reports that he does not drink alcohol and does not use drugs.   Family History   His family history includes Asthma in his sister and another family member; Diabetes type II in his sister; Hypertension in his sister.   Allergies No Known Allergies   Home Medications  Prior to Admission medications   Medication Sig Start Date End Date Taking? Authorizing Provider  allopurinol (ZYLOPRIM) 100 MG tablet Take 100 mg by mouth daily.   Yes [provider]  atorvastatin (LIPITOR) 10 MG tablet Take 10 mg by mouth daily.   Yes [provider]  budesonide-formoterol (SYMBICORT) 160-4.5 MCG/ACT inhaler Inhale 2 puffs then rinse mouth, twice daiy Patient taking differently: Inhale 2 puffs into the lungs 2 (two) times daily. Rinse mouth after use 01/24/19  Yes Young, Clinton D, MD  desonide (DESOWEN) 0.05 % lotion Apply 1 application topically daily. Apply to face   Yes [provider]  insulin aspart (NOVOLOG FLEXPEN) 100 UNIT/ML FlexPen Inject 8 Units into the skin 3 (three) times daily with meals. 03/06/19  Yes Marianna Payment, MD  insulin glargine (LANTUS) 100 unit/mL SOPN Inject 0.35 mLs (35 Units total) into the skin at bedtime. 03/06/19 09/07/19 Yes Marianna Payment, MD  ketoconazole (NIZORAL) 2 % cream Apply 1 application topically daily. Apply to wounds   Yes [provider]  metFORMIN (GLUCOPHAGE) 1000 MG tablet Take 1 tablet (1,000 mg total) by mouth 2 (two) times daily with a meal. 03/06/19 03/05/20 Yes Chundi, Vahini, MD  metolazone (ZAROXOLYN) 2.5 MG tablet Take 2.5 mg by mouth daily.   Yes [provider]  potassium chloride SA (K-DUR,KLOR-CON) 20 MEQ tablet Take 1 tablet (20 mEq total) by mouth daily. 07/29/17  Yes Angiulli, Lavon Paganini, PA-C  sertraline (ZOLOFT) 100 MG tablet Take 100 mg by mouth daily.    Yes [provider]  torsemide (DEMADEX) 5 MG tablet Take 5 mg by mouth daily.  01/17/19  Yes [provider]  Insulin Pen Needle (BD ULTRA-FINE PEN NEEDLES) 29G X 12.7MM MISC USE AS DIRECTED WITH LANTUS 12/23/17   [provider]     Gladys Damme, MD [place] [facility], PGY-2

## 2019-08-07 LAB — GLUCOSE, CAPILLARY
Glucose-Capillary: 105 mg/dL — ABNORMAL HIGH (ref 70–99)
Glucose-Capillary: 122 mg/dL — ABNORMAL HIGH (ref 70–99)
Glucose-Capillary: 123 mg/dL — ABNORMAL HIGH (ref 70–99)
Glucose-Capillary: 134 mg/dL — ABNORMAL HIGH (ref 70–99)
Glucose-Capillary: 142 mg/dL — ABNORMAL HIGH (ref 70–99)
Glucose-Capillary: 147 mg/dL — ABNORMAL HIGH (ref 70–99)
Glucose-Capillary: 149 mg/dL — ABNORMAL HIGH (ref 70–99)
Glucose-Capillary: 156 mg/dL — ABNORMAL HIGH (ref 70–99)

## 2019-08-07 LAB — BASIC METABOLIC PANEL
Anion gap: 9 (ref 5–15)
BUN: 78 mg/dL — ABNORMAL HIGH (ref 6–20)
CO2: 34 mmol/L — ABNORMAL HIGH (ref 22–32)
Calcium: 8.5 mg/dL — ABNORMAL LOW (ref 8.9–10.3)
Chloride: 106 mmol/L (ref 98–111)
Creatinine, Ser: 1.4 mg/dL — ABNORMAL HIGH (ref 0.61–1.24)
GFR calc Af Amer: >60 mL/min (ref >60)
GFR calc non Af Amer: 55 mL/min — ABNORMAL LOW (ref >60)
Glucose, Bld: 180 mg/dL — ABNORMAL HIGH (ref 70–99)
Potassium: 3.6 mmol/L (ref 3.5–5.1)
Sodium: 149 mmol/L — ABNORMAL HIGH (ref 135–145)

## 2019-08-07 LAB — CBC
HCT: 32.6 % — ABNORMAL LOW (ref 39.0–52.0)
Hemoglobin: 9.4 g/dL — ABNORMAL LOW (ref 13.0–17.0)
MCH: 28 pg (ref 26.0–34.0)
MCHC: 28.8 g/dL — ABNORMAL LOW (ref 30.0–36.0)
MCV: 97 fL (ref 80.0–100.0)
Platelets: 189 10*3/uL (ref 150–400)
RBC: 3.36 MIL/uL — ABNORMAL LOW (ref 4.22–5.81)
RDW: 16.4 % — ABNORMAL HIGH (ref 11.5–15.5)
WBC: 5.9 10*3/uL (ref 4.0–10.5)
nRBC: 0.3 % — ABNORMAL HIGH (ref 0.0–0.2)

## 2019-08-07 LAB — T4, FREE: Free T4: 0.91 ng/dL (ref 0.61–1.12)

## 2019-08-07 LAB — TRIGLYCERIDES: Triglycerides: 319 mg/dL — ABNORMAL HIGH (ref <150)

## 2019-08-07 LAB — TSH: TSH: 0.434 u[IU]/mL (ref 0.350–4.500)

## 2019-08-07 MED ORDER — FREE WATER
350.0000 mL | Status: DC
  Administered 2019-08-07 – 2019-08-13 (×33): 350 mL

## 2019-08-07 MED ORDER — PROSOURCE TF PO LIQD
45.0000 mL | Freq: Three times a day (TID) | ORAL | Status: DC
  Administered 2019-08-07 – 2019-09-05 (×83): 45 mL
  Filled 2019-08-07 (×86): qty 45

## 2019-08-07 NOTE — Plan of Care (Signed)

## 2019-08-07 NOTE — Progress Notes (Signed)
eLink Physician-Brief Progress Note Patient Name: Timothy Le DOB: 1960/12/15 MRN: [id number]   Date of Service  08/07/2019  HPI/Events of Note  Frequent loose stools, request for Flexiseal  eICU Interventions  Fecal management system ordered     Intervention Category Minor Interventions: Other:  Judd Lien 08/07/2019, 6:03 AM

## 2019-08-07 NOTE — Progress Notes (Addendum)
NAME:  Timothy Le, MRN:  [id number], DOB:  1960-09-11, LOS: 8 ADMISSION DATE:  07/30/2019, CONSULTATION DATE:  07/30/2010 REFERRING MD:  Mariane Masters, CHIEF COMPLAINT:  Acute respiratory failure   Brief History   59 year old black male with baseline chronic respiratory failure on the basis of severe OHS/OSA complicated further by significant obstructive disease in the setting of COPD.  Presents with acute on chronic hypoxic and hypercarbic respiratory failure.  Admitted with the initial working diagnosis of pulmonary edema and decompensated diastolic heart failure. Per family patient was without CPAP for 2 weeks PTA as his machine broke.  History of present illness   59 year old male patient known to pulmonary division followed for chronic respiratory failure on the basis of severe COPD, as well as significant restrictive disease by obesity hypoventilation and obstructive sleep apnea.  He is managed on BiPAP, after successfully having significant weight loss which allowed him to have tracheostomy removed following successful titration study.  He has been followed by Dr. Annamaria Boots in the outpatient setting last seen in May.  And had been doing well followed by a nutritionist and walking daily. Presents to the emergency room via EMS today on 7/11.  Apparently had been taking garbage out, on return to the house was acutely short of breath with significant wheezing.  He tried bronchodilator, also placed himself on his home BiPAP device without improvement EMS was therefore called fire arrived first finding him minimally responsive he was manually assisted ventilation with bag valve mask, given albuterol nebulizer and transferred to the emergency room.  He was unresponsive on presentation and was intubated for airway protection, marked hypoxia, and presumptive hypercarbia.  After airway was stabilized The following initial diagnostic data was identified: White blood cell count 14.4, initial venous blood gas PCO2  64, bicarbonate 28, pH 7.25, follow-up intubation arterial blood gas pH 7.22, PCO2 74, PO2 40 bicarbonate 30, pulse oximetry 63%.  Portable chest x-ray with diffuse pulmonary edema endotracheal tube in satisfactory position.  On i-STAT chemistry creatinine 1.5 up from baseline of 1.35-1.43 glucose 652. Critical Care admitted.  Past Medical History  Past medical history of Asthma, Chronic respiratory failure with hypoxia ([place]) (07/09/2017), COPD (chronic obstructive pulmonary disease) ([place]), Diabetes mellitus without complication ([place]), Difficult intubation, Hypertension, Shortness of breath dyspnea, and Sleep apnea.   Significant Hospital Events   On the night of 7/15-7/16 the patient had one event of desaturation to 70s, resolved with suctioning by RT and RN. Na increased from 147-152, free water flushes increased to 60 mL/hr. Febrile this morning to 100.7*F at 0830. Peep decreased to 8, saturations stable at 100%, FiO2 40%.  On 7/16, the patient was extubated but subsequently became stridorous. Attempted re-intubation was unsuccessful, even via FOB such that the patient had an #6 Shiley emergent trach placed by Trauma Surg. This was followed by atelectasis of the right lung, from which a blood clot was removed via FOB.  Consults:  Trauma Surg ENT  Procedures:  Endotracheal tube 7/11 Right IJ triple-lumen catheter 7/11 Trach 7/16  Significant Diagnostic Tests:  Echocardiogram 7/11 >> no evidence of RV strain; LVEF 50-55%.Left ventricular diastolic function could not be evaluated.  Lower extremity ultrasound 7/11 >> no evidence of lower extremity DVT  Micro Data:  7/13 trach aspirate - nl resp. flora  Antimicrobials:  Pip/tazo - 7/16>7/18  Interim history/subjective:  No significant overnight events  Objective   Blood pressure 126/78, pulse (!) 59, temperature 98.4 F (36.9 C), temperature source Oral, resp. rate (!) 27,  height 6' (1.829 m), weight (!) 148.7 kg, SpO2 97 %.    Vent  Mode: PRVC FiO2 (%):  [40 %] 40 % Set Rate:  [30 bmp] 30 bmp Vt Set:  [400 mL] 400 mL PEEP:  [10 NUU72-53 cmH20] 10 cmH20 Plateau Pressure:  [20 cmH20-26 cmH20] 26 cmH20   Intake/Output Summary (Last 24 hours) at 08/07/2019 0833 Last data filed at 08/07/2019 0657 Gross per 24 hour  Intake 2346.16 ml  Output 1900 ml  Net 446.16 ml   Filed Weights   08/05/19 0423 08/06/19 0500 08/07/19 0500  Weight: (!) 144.2 kg (!) 145.6 kg (!) 148.7 kg    Examination: General: critically and chronically ill appearing HEENT: tracheostomy site appears clean with intact dressing Cardiac: RRR. +1 UE edema. Mild LE edema. Extremities warm Pulm: on PRVC PEEP 10 via trach. Mechanical breath sounds appreciated throughout. No adventious sounds appreciable.  GI: moderately distended. Flexiseal present Neuro: awakes and looks towards voice but does not make eye contact. Possibly followed command to wiggle toes although this might have been unintentional. Did not follow any other commands. PERRL.  Skin: no rash   Resolved Hospital Problem list   Lactic acidosis Mild leukocytosis  Assessment & Plan:  Acute on chronic hypoxic and hypercarbic respiratory failure requiring mechanical ventilation. S/p tracheostomy 7/16.  Currently on PRVC PEEP 10, FiO2 40% Plan: Continue to work on weaning vent settings. Once tolerating min vent settings, can work towards trach collar trial however mental status may also be a barrier to this. VAP bundle.  Acute encephalopathy--toxic vs ?stuctural. No significant improvement in mental status over the past few days. He remains on sedation with fentanyl and propofol. Would like to work on weaning that sedation down to see if his mental status improves. If not, may consider obtaining an MRI for further evaluation.   T2DM. CBGs within goal. Continue SSI and lantus  Acute on CKD. Renal function continuing to improve. Continue to monitor Hypernatremia improving. Continue free water  flushes.  Acute anemia. Hgb trending down over admission 15>13>12>10.4>9.4. suspect some of this is 2/2 blood loss during tracheostomy but also likely a component of acute illness involved. Will continue to monitor.  Thyroid goiter. Causing narrowing of airway. Follow up with ENT for thyroidectomy prior to decannulation. Hematuria. Will need repeat UA in 4-6w  Best practice:  Diet: Vital TF Pain/Anxiety/Delirium protocol (if indicated): RASS -1, -2 VAP protocol (if indicated): yes DVT prophylaxis: Enoxaparin GI prophylaxis: PPI Glucose control: Levemir + SSI Mobility: BR Code Status: Full Family Communication: Will update Disposition: ICU  Labs   CBC: Recent Labs  Lab 08/01/19 0528 08/02/19 0421 08/05/19 0553 08/07/19 0513  WBC 10.6* 9.7 7.0 5.9  NEUTROABS  --  7.0  --   --   HGB 13.0 12.0* 10.4* 9.4*  HCT 41.2 38.2* 35.2* 32.6*  MCV 88.6 91.0 97.0 97.0  PLT 178 161 165 664    Basic Metabolic Panel: Recent Labs  Lab 08/03/19 0348 08/03/19 0348 08/03/19 2000 08/04/19 0353 08/05/19 0553 08/05/19 1730 08/06/19 0542 08/07/19 0513  NA 147*   < > 152* 152* 151*  --  150* 149*  K 3.2*   < > 3.9 3.7 3.9  --  3.4* 3.6  CL 98   < > 105 104 106  --  104 106  CO2 37*   < > 33* 37* 34*  --  34* 34*  GLUCOSE 195*   < > 110* 165* 237*  --  215* 180*  BUN 78*   < > 78* 77* 93*  --  86* 78*  CREATININE 1.71*   < > 1.56* 1.45* 1.94*  --  1.61* 1.40*  CALCIUM 8.6*   < > 8.7* 8.7* 8.3*  --  8.7* 8.5*  MG 2.2  --  2.5* 2.4 2.4 2.3  --   --   PHOS 2.4*  --  4.0 3.3 4.0 3.1  --   --    < > = values in this interval not displayed.   GFR: Estimated Creatinine Clearance: 87.3 mL/min (A) (by C-G formula based on SCr of 1.4 mg/dL (H)). Recent Labs  Lab 07/31/19 1759 07/31/19 1800 08/01/19 0528 08/02/19 0355 08/02/19 0421 08/03/19 0348 08/05/19 0553 08/07/19 0513  PROCALCITON  --  8.04 8.16 5.82  --  4.21  --   --   WBC  --   --  10.6*  --  9.7  --  7.0 5.9  LATICACIDVEN  1.7  --   --   --   --   --   --   --     Liver Function Tests: No results for input(s): AST, ALT, ALKPHOS, BILITOT, PROT, ALBUMIN in the last 168 hours. No results for input(s): LIPASE, AMYLASE in the last 168 hours. No results for input(s): AMMONIA in the last 168 hours.  ABG    Component Value Date/Time   PHART 7.340 (L) 07/31/2019 0516   PCO2ART 57.1 (H) 07/31/2019 0516   PO2ART 172 (H) 07/31/2019 0516   HCO3 30.6 (H) 07/31/2019 0516   TCO2 32 07/31/2019 0516   ACIDBASEDEF 1.0 07/30/2019 1724   O2SAT 99.0 07/31/2019 0516     Coagulation Profile: No results for input(s): INR, PROTIME in the last 168 hours.  Cardiac Enzymes: No results for input(s): CKTOTAL, CKMB, CKMBINDEX, TROPONINI in the last 168 hours.  HbA1C: Hgb A1c MFr Bld  Date/Time Value Ref Range Status  08/03/2019 01:22 PM 14.9 (H) 4.8 - 5.6 % Final    Comment:    (NOTE) Pre diabetes:          5.7%-6.4%  Diabetes:              >6.4%  Glycemic control for   <7.0% adults with diabetes   03/04/2019 07:08 AM 16.6 (H) 4.8 - 5.6 % Final    Comment:    (NOTE) Pre diabetes:          5.7%-6.4% Diabetes:              >6.4% Glycemic control for   <7.0% adults with diabetes     CBG: Recent Labs  Lab 08/06/19 1646 08/06/19 1955 08/07/19 0051 08/07/19 0431 08/07/19 0802  GLUCAP 149* [place], MD Internal medicine resident PGY 2 08/07/19 9:17 AM    Attending Note:  I have examined patient, reviewed labs, studies and notes.   58 year old obese man with OSA/OHS, hypertension with diastolic CHF and chronic hypercapnic and hypoxic respiratory failure.  He was admitted with decompensation of his chronic respiratory failure due at least in part to decompensation of his chronic diastolic CHF.  Also note he had been without CPAP for 2 weeks because his machine was not functioning.  Extubation was attempted on 7/16 but he failed due to stridor.  He underwent emergent trach  placement by trauma surgery to achieve stabilization.  He has continued to be intermittently agitated and has required propofol and fentanyl.  His triglycerides are elevated.  He is  also had some progressive anemia.  Vitals:   08/07/19 1200 08/07/19 1300 08/07/19 1400 08/07/19 1500  BP: 122/74 118/72 120/74 118/81  Pulse: 62 (!) 57 63 68  Resp: 17 20 15 19   Temp:      TempSrc:      SpO2: 95% 91% 91% 91%  Weight:      Height:      Obese ill-appearing man, ventilated via tracheostomy.  Sedated as to voice, turned his head to voice.  Not reliably following commands.  Lungs are very distant without crackles or rhonchi.  Heart is regular without a murmur.  Abdomen obese, nondistended with positive bowel sounds.  Proximal and hypercapnic respiratory failure, multifactorial due to OSA/OHS and also acute exacerbation of diastolic CHF.  He failed extubation and had recurrent respiratory failure likely due to at least in part upper airway obstruction and stridor.  He underwent emergent tracheostomy.  Goal will be to lighten his sedation quickly now that tracheostomy is in place, push for pressure support and hopefully ATC soon.  Acute toxic metabolic encephalopathy, need for sedation.  As above goal is to lighten this.  He will need to come off propofol since he has hypertriglyceridemia.  Type 2 diabetes mellitus.  Managing with sign scale insulin and Lantus  Acute renal failure.  Improving.  Continue to follow urine output and BMP  Acute anemia.  Likely at least some component of hemodilution.  Continue to follow CBC.  No evidence of active bleeding noted.  Independent critical care time is 33 minutes.   Baltazar Apo, MD, PhD 08/07/2019, 3:50 PM [facility] [phone] or if no answer [phone]

## 2019-08-07 NOTE — Progress Notes (Deleted)
CRITICAL VALUE ALERT  Critical Value:  Hemoglobin 6.2  Date & Time Notied:  07/19 05:33  Provider Notified: Bertha Stakes, DO  Orders Received/Actions taken: Notified, waiting on new orders

## 2019-08-07 NOTE — Plan of Care (Signed)
  Problem: Education: Goal: Knowledge of General Education information will improve Description: Including pain rating scale, medication(s)/side effects and non-pharmacologic comfort measures 08/07/2019 0325 by Lorraine Lax, RN Outcome: Progressing 08/07/2019 0323 by Lorraine Lax, RN Outcome: Progressing   Problem: Health Behavior/Discharge Planning: Goal: Ability to manage health-related needs will improve 08/07/2019 0325 by Lorraine Lax, RN Outcome: Progressing 08/07/2019 0323 by Lorraine Lax, RN Outcome: Progressing   Problem: Clinical Measurements: Goal: Ability to maintain clinical measurements within normal limits will improve 08/07/2019 0325 by Lorraine Lax, RN Outcome: Progressing 08/07/2019 0323 by Lorraine Lax, RN Outcome: Progressing Goal: Will remain free from infection 08/07/2019 0325 by Lorraine Lax, RN Outcome: Progressing 08/07/2019 0323 by Lorraine Lax, RN Outcome: Progressing Goal: Diagnostic test results will improve 08/07/2019 0325 by Lorraine Lax, RN Outcome: Progressing 08/07/2019 0323 by Lorraine Lax, RN Outcome: Progressing Goal: Respiratory complications will improve 08/07/2019 0325 by Lorraine Lax, RN Outcome: Progressing 08/07/2019 0323 by Lorraine Lax, RN Outcome: Progressing Goal: Cardiovascular complication will be avoided 08/07/2019 0325 by Lorraine Lax, RN Outcome: Progressing 08/07/2019 0323 by Lorraine Lax, RN Outcome: Progressing   Problem: Activity: Goal: Risk for activity intolerance will decrease 08/07/2019 0325 by Lorraine Lax, RN Outcome: Progressing 08/07/2019 0323 by Lorraine Lax, RN Outcome: Progressing   Problem: Nutrition: Goal: Adequate nutrition will be maintained 08/07/2019 0325 by Lorraine Lax, RN Outcome: Progressing 08/07/2019 0323 by Lorraine Lax, RN Outcome: Progressing   Problem: Coping: Goal: Level of anxiety will decrease 08/07/2019  0325 by Lorraine Lax, RN Outcome: Progressing 08/07/2019 0323 by Lorraine Lax, RN Outcome: Progressing   Problem: Elimination: Goal: Will not experience complications related to bowel motility 08/07/2019 0325 by Lorraine Lax, RN Outcome: Progressing 08/07/2019 0323 by Lorraine Lax, RN Outcome: Progressing Goal: Will not experience complications related to urinary retention 08/07/2019 0325 by Lorraine Lax, RN Outcome: Progressing 08/07/2019 0323 by Lorraine Lax, RN Outcome: Progressing   Problem: Pain Managment: Goal: General experience of comfort will improve 08/07/2019 0325 by Lorraine Lax, RN Outcome: Progressing 08/07/2019 0323 by Lorraine Lax, RN Outcome: Progressing   Problem: Safety: Goal: Ability to remain free from injury will improve 08/07/2019 0325 by Lorraine Lax, RN Outcome: Progressing 08/07/2019 0323 by Lorraine Lax, RN Outcome: Progressing   Problem: Skin Integrity: Goal: Risk for impaired skin integrity will decrease 08/07/2019 0325 by Lorraine Lax, RN Outcome: Progressing 08/07/2019 0323 by Lorraine Lax, RN Outcome: Progressing

## 2019-08-07 NOTE — Progress Notes (Signed)
Nutrition Follow-up  DOCUMENTATION CODES:   Morbid obesity  INTERVENTION:   Recommend exchanging current NG tube out for Cortrak tube (service available Mondays, Wednesdays and Fridays)  Tube Feeding via NG tube:  Vital High Protein at 70 ml/hr Pro-Source TF 45 mL TID Providing 181 g of protein, 1800 kcals and 1411 mL of free water Meets 100% estimated calorie and protein needs  NUTRITION DIAGNOSIS:   Inadequate oral intake related to inability to eat as evidenced by NPO status.  Being addressed via TF   GOAL:   Patient will meet greater than or equal to 90% of their needs  Met          MONITOR:   TF tolerance, Vent status, Weight trends, Labs, I & O's  REASON FOR ASSESSMENT:   Consult Enteral/tube feeding initiation and management  ASSESSMENT:   Pt with baseline chronic respiratory failure on the basis of severe OHS/OSA complicated further by significant obstructive disease in the setting of COPD.  Presents with acute on chronic hypoxic and hypercarbic respiratory failure.  Admitted with the initial working diagnosis of pulmonary edema and decompensated diastolic heart failure. PMH includes HTN, COPD, DM.  7/11 Intubated 7/16 Extubated but developed stridor, attempted re-intubation unsuccessful, emergent Trach placed  Noted pt with goiter; per MD notes, pt will require thyroidectomy prior to decannulation  +liquid stool, rectal tube in place. Noted pt with scheduled colace liquid, miralax. Recommend changing to prn  Vital High Protein at 70 ml/hr, 250 mL free water q 4 hours, Pro-SourceTF BID via NG tube (tip extends into stomach per chest xray)  Current weight 148.7 kg; admit weight 146.9 kg. Lowest weight of 142.6 kg on 7/15. Net +5 L. Utilizing 143 kg for estimating nutritional needs  Labs: sodium 149 (H), Creatinine 1.40, BUN 78, CBGs 105-200 (ICU goal 140-180) Meds: ss novolog, novolog q 4 hours, levemir  Diet Order:   Diet Order            Diet NPO  time specified  Diet effective now                 EDUCATION NEEDS:   No education needs have been identified at this time  Skin:  Skin Assessment: Reviewed RN Assessment  Last BM:  7/19 rectal tube  Height:   Ht Readings from Last 1 Encounters:  07/30/19 6' (1.829 m)    Weight:   Wt Readings from Last 1 Encounters:  08/07/19 (!) 148.7 kg    Ideal Body Weight:  80.9 kg  BMI:  Body mass index is 44.46 kg/m.  Estimated Nutritional Needs:   Kcal:  3614-4315  Protein:  160-200  Fluid:  >1.6L/d   Kerman Passey MS, RDN, LDN, CNSC Registered Dietitian III Clinical Nutrition RD Pager and On-Call Pager Number Located in [place]

## 2019-08-07 NOTE — Progress Notes (Addendum)
NAME:  Timothy Le, MRN:  [id number], DOB:  1960-12-06, LOS: 9 ADMISSION DATE:  07/30/2019, CONSULTATION DATE:  07/30/2010 REFERRING MD:  Mariane Masters, CHIEF COMPLAINT:  Acute respiratory failure   Brief History   59 year old black male with baseline chronic respiratory failure on the basis of severe OHS/OSA complicated further by significant obstructive disease in the setting of COPD.  Presents with acute on chronic hypoxic and hypercarbic respiratory failure.  Admitted with the initial working diagnosis of pulmonary edema and decompensated diastolic heart failure. Per family patient was without CPAP for 2 weeks PTA as his machine broke.  History of present illness   59 year old male patient known to pulmonary division followed for chronic respiratory failure on the basis of severe COPD, as well as significant restrictive disease by obesity hypoventilation and obstructive sleep apnea.  He is managed on BiPAP, after successfully having significant weight loss which allowed him to have tracheostomy removed following successful titration study.  He has been followed by Dr. Annamaria Boots in the outpatient setting last seen in May.  And had been doing well followed by a nutritionist and walking daily. Presents to the emergency room via EMS today on 7/11.  Apparently had been taking garbage out, on return to the house was acutely short of breath with significant wheezing.  He tried bronchodilator, also placed himself on his home BiPAP device without improvement EMS was therefore called fire arrived first finding him minimally responsive he was manually assisted ventilation with bag valve mask, given albuterol nebulizer and transferred to the emergency room.  He was unresponsive on presentation and was intubated for airway protection, marked hypoxia, and presumptive hypercarbia.  After airway was stabilized The following initial diagnostic data was identified: White blood cell count 14.4, initial venous blood gas PCO2  64, bicarbonate 28, pH 7.25, follow-up intubation arterial blood gas pH 7.22, PCO2 74, PO2 40 bicarbonate 30, pulse oximetry 63%.  Portable chest x-ray with diffuse pulmonary edema endotracheal tube in satisfactory position.  On i-STAT chemistry creatinine 1.5 up from baseline of 1.35-1.43 glucose 652. Critical Care admitted.  Past Medical History  Past medical history of Asthma, Chronic respiratory failure with hypoxia ([place]) (07/09/2017), COPD (chronic obstructive pulmonary disease) ([place]), Diabetes mellitus without complication ([place]), Difficult intubation, Hypertension, Shortness of breath dyspnea, and Sleep apnea.   Significant Hospital Events   On the night of 7/15-7/16 the patient had one event of desaturation to 70s, resolved with suctioning by RT and RN. Na increased from 147-152, free water flushes increased to 60 mL/hr. Febrile this morning to 100.7*F at 0830. Peep decreased to 8, saturations stable at 100%, FiO2 40%.  On 7/16, the patient was extubated but subsequently became stridorous. Attempted re-intubation was unsuccessful, even via FOB such that the patient had an #6 Shiley emergent trach placed by Trauma Surg. This was followed by atelectasis of the right lung, from which a blood clot was removed via FOB.  Consults:  Trauma Surg ENT  Procedures:  Endotracheal tube 7/11 Right IJ triple-lumen catheter 7/11 Trach 7/16  Significant Diagnostic Tests:  Echocardiogram 7/11 >> no evidence of RV strain; LVEF 50-55%.Left ventricular diastolic function could not be evaluated.  Lower extremity ultrasound 7/11 >> no evidence of lower extremity DVT  Micro Data:  7/13 trach aspirate - nl resp. flora  Antimicrobials:  Pip/tazo - 7/16>7/18  Interim history/subjective:  No significant overnight events.  Remains on PS.  Objective   Blood pressure 112/71, pulse (!) 56, temperature 97.7 F (36.5 C), temperature source Axillary,  resp. rate (!) 22, height 6' (1.829 m), weight (!) 148.3 kg,  SpO2 94 %.    Vent Mode: PSV;CPAP FiO2 (%):  [40 %] 40 % Set Rate:  [30 bmp] 30 bmp Vt Set:  [400 mL] 400 mL PEEP:  [8 cmH20-10 cmH20] 8 cmH20 Pressure Support:  [12 cmH20] 12 cmH20 Plateau Pressure:  [22 cmH20] 22 cmH20   Intake/Output Summary (Last 24 hours) at 08/08/2019 2244 Last data filed at 08/08/2019 0300 Gross per 24 hour  Intake 3106.47 ml  Output 2425 ml  Net 681.47 ml   Filed Weights   08/07/19 0500 08/07/19 2000 08/08/19 0233  Weight: (!) 148.7 kg (!) 148.3 kg (!) 148.3 kg    Examination: GEN: obese chronically ill man on vent HEENT: trach in place, small bloody secretions CV: RRR, ext warm PULM: Diminished breath sounds, triggering vent GI: protuberant, soft, +BS EXT: trace edema NEURO: moves both his lower ext but not to command, opens eyes and track intermittently PSYCH: RASS -1 SKIN: No rashes  Low K, high sodium CBC stable   Resolved Hospital Problem list   Lactic acidosis Mild leukocytosis  Assessment & Plan:  Acute on chronic hypoxic and hypercarbic respiratory failure requiring mechanical ventilation. S/p tracheostomy 7/16.  - PS trials as able for now - VAP prevention bundle  Acute encephalopathy-  Has had multiple periods of severe hypoxemia (admission, emergent trach) which could have caused anoxic brain injury.  Or more likely this is just lingering sedative effects - Wean fentanyl, propofol to off - If still cannot get to follow commands would get MRI  T2DM. CBGs within goal. Continue SSI and lantus  Acute on CKD. Renal function continuing to improve. Continue to monitor Hypernatremia increase free water flushes.  Continue metolazone.  Thyroid goiter. Causing narrowing of airway. Follow up with ENT for thyroidectomy prior to decannulation. Hematuria. Will need repeat UA in 4-6w  Best practice:  Diet: Vital TF Pain/Anxiety/Delirium protocol (if indicated): wean VAP protocol (if indicated): yes DVT prophylaxis: Enoxaparin GI  prophylaxis: PPI Glucose control: Levemir + SSI Mobility: BR Code Status: Full Family Communication: pending Disposition: ICU    The patient is critically ill with multiple organ systems failure and requires high complexity decision making for assessment and support, frequent evaluation and titration of therapies, application of advanced monitoring technologies and extensive interpretation of multiple databases. Critical Care Time devoted to patient care services described in this note independent of APP/resident time (if applicable)  is 34 minutes.   Erskine Emery MD [facility] 08/08/2019 6:05 AM Personal pager: [phone] If unanswered, please page CCM On-call: [phone]

## 2019-08-08 ENCOUNTER — Inpatient Hospital Stay (HOSPITAL_COMMUNITY): Payer: Medicaid Other

## 2019-08-08 LAB — CBC
HCT: 33.6 % — ABNORMAL LOW (ref 39.0–52.0)
Hemoglobin: 9.6 g/dL — ABNORMAL LOW (ref 13.0–17.0)
MCH: 27.4 pg (ref 26.0–34.0)
MCHC: 28.6 g/dL — ABNORMAL LOW (ref 30.0–36.0)
MCV: 96 fL (ref 80.0–100.0)
Platelets: 209 10*3/uL (ref 150–400)
RBC: 3.5 MIL/uL — ABNORMAL LOW (ref 4.22–5.81)
RDW: 16.1 % — ABNORMAL HIGH (ref 11.5–15.5)
WBC: 7 10*3/uL (ref 4.0–10.5)
nRBC: 0.3 % — ABNORMAL HIGH (ref 0.0–0.2)

## 2019-08-08 LAB — BASIC METABOLIC PANEL
Anion gap: 8 (ref 5–15)
BUN: 57 mg/dL — ABNORMAL HIGH (ref 6–20)
CO2: 35 mmol/L — ABNORMAL HIGH (ref 22–32)
Calcium: 8.5 mg/dL — ABNORMAL LOW (ref 8.9–10.3)
Chloride: 106 mmol/L (ref 98–111)
Creatinine, Ser: 1.11 mg/dL (ref 0.61–1.24)
GFR calc Af Amer: >60 mL/min (ref >60)
GFR calc non Af Amer: >60 mL/min (ref >60)
Glucose, Bld: 120 mg/dL — ABNORMAL HIGH (ref 70–99)
Potassium: 3.1 mmol/L — ABNORMAL LOW (ref 3.5–5.1)
Sodium: 149 mmol/L — ABNORMAL HIGH (ref 135–145)

## 2019-08-08 LAB — GLUCOSE, CAPILLARY
Glucose-Capillary: 98 mg/dL (ref 70–99)
Glucose-Capillary: 77 mg/dL (ref 70–99)
Glucose-Capillary: 121 mg/dL — ABNORMAL HIGH (ref 70–99)
Glucose-Capillary: 138 mg/dL — ABNORMAL HIGH (ref 70–99)
Glucose-Capillary: 154 mg/dL — ABNORMAL HIGH (ref 70–99)

## 2019-08-08 MED ORDER — DEXMEDETOMIDINE HCL IN NACL 400 MCG/100ML IV SOLN
0.4000 ug/kg/h | INTRAVENOUS | Status: DC
  Administered 2019-08-08 (×3): 1 ug/kg/h via INTRAVENOUS
  Administered 2019-08-08: 1.001 ug/kg/h via INTRAVENOUS
  Administered 2019-08-08 (×2): 0.4 ug/kg/h via INTRAVENOUS
  Administered 2019-08-08: 0.6 ug/kg/h via INTRAVENOUS
  Administered 2019-08-09: 0.9 ug/kg/h via INTRAVENOUS
  Administered 2019-08-09 (×4): 1 ug/kg/h via INTRAVENOUS
  Administered 2019-08-09: 0.9 ug/kg/h via INTRAVENOUS
  Administered 2019-08-09: 1 ug/kg/h via INTRAVENOUS
  Administered 2019-08-09: 0.9 ug/kg/h via INTRAVENOUS
  Administered 2019-08-10 (×2): 1 ug/kg/h via INTRAVENOUS
  Administered 2019-08-10: 1.2 ug/kg/h via INTRAVENOUS
  Administered 2019-08-10: 1 ug/kg/h via INTRAVENOUS
  Administered 2019-08-10: 0.6 ug/kg/h via INTRAVENOUS
  Administered 2019-08-10 (×2): 1 ug/kg/h via INTRAVENOUS
  Administered 2019-08-10: 1.2 ug/kg/h via INTRAVENOUS
  Administered 2019-08-10: 1 ug/kg/h via INTRAVENOUS
  Administered 2019-08-10: 0.9 ug/kg/h via INTRAVENOUS
  Administered 2019-08-11: 1 ug/kg/h via INTRAVENOUS
  Administered 2019-08-11: 0.5 ug/kg/h via INTRAVENOUS
  Administered 2019-08-11 (×2): 0.8 ug/kg/h via INTRAVENOUS
  Administered 2019-08-11 (×4): 1 ug/kg/h via INTRAVENOUS
  Administered 2019-08-12 (×2): 0.8 ug/kg/h via INTRAVENOUS
  Administered 2019-08-12 (×2): 0.7 ug/kg/h via INTRAVENOUS
  Administered 2019-08-12: 1 ug/kg/h via INTRAVENOUS
  Administered 2019-08-12: 0.6 ug/kg/h via INTRAVENOUS
  Administered 2019-08-13 (×7): 0.8 ug/kg/h via INTRAVENOUS
  Administered 2019-08-14: 1.2 ug/kg/h via INTRAVENOUS
  Administered 2019-08-14 (×3): 0.8 ug/kg/h via INTRAVENOUS
  Administered 2019-08-14: 0.6 ug/kg/h via INTRAVENOUS
  Administered 2019-08-15: 0.7 ug/kg/h via INTRAVENOUS
  Administered 2019-08-15: 0.8 ug/kg/h via INTRAVENOUS
  Administered 2019-08-15: 0.4 ug/kg/h via INTRAVENOUS
  Administered 2019-08-15 (×2): 1 ug/kg/h via INTRAVENOUS
  Administered 2019-08-15: 0.4 ug/kg/h via INTRAVENOUS
  Administered 2019-08-16: 1 ug/kg/h via INTRAVENOUS
  Administered 2019-08-16: 0.5 ug/kg/h via INTRAVENOUS
  Administered 2019-08-16: 1.2 ug/kg/h via INTRAVENOUS
  Administered 2019-08-16 (×3): 0.6 ug/kg/h via INTRAVENOUS
  Administered 2019-08-17: 0.8 ug/kg/h via INTRAVENOUS
  Administered 2019-08-17 – 2019-08-20 (×2): 0.6 ug/kg/h via INTRAVENOUS
  Administered 2019-08-20 – 2019-08-21 (×2): 0.2 ug/kg/h via INTRAVENOUS
  Administered 2019-08-21: 0.6 ug/kg/h via INTRAVENOUS
  Filled 2019-08-08 (×2): qty 100
  Filled 2019-08-08: qty 200
  Filled 2019-08-08 (×9): qty 100
  Filled 2019-08-08: qty 200
  Filled 2019-08-08 (×7): qty 100
  Filled 2019-08-08: qty 200
  Filled 2019-08-08 (×14): qty 100
  Filled 2019-08-08: qty 200
  Filled 2019-08-08 (×2): qty 100
  Filled 2019-08-08: qty 200
  Filled 2019-08-08 (×17): qty 100

## 2019-08-08 MED ORDER — PIPERACILLIN-TAZOBACTAM 3.375 G IVPB
3.3750 g | Freq: Three times a day (TID) | INTRAVENOUS | Status: DC
  Administered 2019-08-08 – 2019-08-09 (×3): 3.375 g via INTRAVENOUS
  Filled 2019-08-08 (×2): qty 50

## 2019-08-08 MED ORDER — PIPERACILLIN-TAZOBACTAM 3.375 G IVPB: 3.3750 g | Freq: Three times a day (TID) | INTRAVENOUS | Status: DC

## 2019-08-08 MED ORDER — POTASSIUM CHLORIDE 20 MEQ/15ML (10%) PO SOLN
40.0000 meq | ORAL | Status: AC
  Administered 2019-08-08: 40 meq
  Filled 2019-08-08: qty 30

## 2019-08-08 MED ORDER — INSULIN DETEMIR 100 UNIT/ML ~~LOC~~ SOLN
40.0000 [IU] | Freq: Two times a day (BID) | SUBCUTANEOUS | Status: DC
  Administered 2019-08-08 – 2019-08-15 (×15): 40 [IU] via SUBCUTANEOUS
  Filled 2019-08-08 (×17): qty 0.4

## 2019-08-08 MED ORDER — POTASSIUM CHLORIDE 10 MEQ/50ML IV SOLN
10.0000 meq | INTRAVENOUS | Status: DC
  Administered 2019-08-08: 10 meq via INTRAVENOUS
  Filled 2019-08-08: qty 50

## 2019-08-08 MED ORDER — GADOBUTROL 1 MMOL/ML IV SOLN
10.0000 mL | Freq: Once | INTRAVENOUS | Status: AC | PRN
  Administered 2019-08-08: 10 mL via INTRAVENOUS

## 2019-08-08 MED ORDER — POTASSIUM CHLORIDE 20 MEQ/15ML (10%) PO SOLN
20.0000 meq | ORAL | Status: DC
  Administered 2019-08-08: 20 meq
  Filled 2019-08-08: qty 15

## 2019-08-08 NOTE — Progress Notes (Signed)
[facility] ADULT ICU REPLACEMENT PROTOCOL   The patient does apply for the [facility] Adult ICU Electrolyte Replacment Protocol based on the criteria listed below:   1. Is GFR >/= 30 ml/min? Yes.    Patient's GFR today is >60 2. Is SCr </= 2? Yes.   Patient's SCr is 1.11 ml/kg/hr 3. Did SCr increase >/= 0.5 in 24 hours? No 4. Abnormal electrolyte(s): K+3.1 5. Ordered repletion with: Protocol 6. If a panic level lab has been reported, has the CCM MD in charge been notified? Yes.  .   Physician:  Randie Heinz 08/08/2019 4:46 AM

## 2019-08-08 NOTE — Progress Notes (Signed)
Per CCM NP, pt placed back on FS due to increased RR. VT increased to 6cc/kg per verbal order.

## 2019-08-08 NOTE — Progress Notes (Signed)
Sedation turned off in attempt to combat pt AMS.  Pt became extremely agitated, tachypnic in 23s. SpO2 dropped to low 80s. Sedation turned back on, restraints placed, FiO2 bumped to 50%. Will attempt to slowly wean sedation when able.

## 2019-08-08 NOTE — Progress Notes (Signed)
Pts QTc is 0.49.  Spoke w/ pharmacy who states pt not receiving any meds that would contribute.  Relayed to CCM.  

## 2019-08-08 NOTE — Plan of Care (Signed)

## 2019-08-08 NOTE — Progress Notes (Signed)
Pt currently has sliding scale insulin plus 18 units q4 hours.  CBGs have been lower today despite holding lantus and 18units all day.  Relayed to CCM MD.

## 2019-08-08 NOTE — Progress Notes (Addendum)
UPDATE NOTE:  Pt remains encephalopathic. We are weaning sedation at this time. Currrently on 39mcg fentanyl and precedex.  PE: opens eyes and turns towards voice but does not follow any commands.   Plan: will stop sedation at this time. If neuro status remains poor, will plan to obtain brain MRI for further evaluation. Propofol may take a while to wash out given the length of time he has been on it but I would expect to at least see some improvement with turning off sedation.  Mitzi Hansen, MD 08/08/19 11:25 AM   Addendum #1  Pt did not tolerate sedation wean due to desaturating due to agitation. I was able to see that he was moving all four extremities though not to command. Unfortunately had to resume sedation due to this but will continue to try to wean as able.  Overall, given his non-focal neuro exam, most likely, this encephalopathy is related to his prolonged hospitalization and prolonged period of sedation however can not rule out structural causes. TSH/T4 obtained yesterday are normal. He does not have any significant enough electrolyte derangements to account for this. No obvious source of infection. Symptoms not consistent with seizures.  Plan: will obtain a brain MRI to rule out structural etiologies.

## 2019-08-08 NOTE — Progress Notes (Signed)
Pt continues to be tachypnic in 58s despite resting calmly/asleep in bed.  Currently on increased dose of both fentanyl and precedex.  RT aware.  Spoke w/ Dr. Carlis Abbott to relay.  Plan to continue to monitor for now. CCM will come to bedside to reassess pt

## 2019-08-08 NOTE — Plan of Care (Signed)
Brain MRI without acute pathology.  Has sinus disease, consistent with physical exam findings reported.  RN Denyse Amass reported that blood sugars is running very low and his every 4 hour to feed coverage has been held all day with tube feeds running.  Most recent blood glucose 138.  Morning long-acting insulin was also held.  Given concern for hypoglycemia, will discontinue Q4h hour tube feeding coverage (18 units), which could be resumed if he has significant refractory hyperglycemia.  Decreasing long-acting insulin to 40 units twice daily, which could also be increased back to 55 units twice daily if needed.  Julian Hy, DO 08/08/19 6:39 PM [facility]

## 2019-08-09 DIAGNOSIS — J9601 Acute respiratory failure with hypoxia: Secondary | ICD-10-CM | POA: Diagnosis not present

## 2019-08-09 DIAGNOSIS — J810 Acute pulmonary edema: Secondary | ICD-10-CM | POA: Diagnosis not present

## 2019-08-09 LAB — CBC
HCT: 32.8 % — ABNORMAL LOW (ref 39.0–52.0)
Hemoglobin: 9.5 g/dL — ABNORMAL LOW (ref 13.0–17.0)
MCH: 27.5 pg (ref 26.0–34.0)
MCHC: 29 g/dL — ABNORMAL LOW (ref 30.0–36.0)
MCV: 95.1 fL (ref 80.0–100.0)
Platelets: 244 10*3/uL (ref 150–400)
RBC: 3.45 MIL/uL — ABNORMAL LOW (ref 4.22–5.81)
RDW: 16.6 % — ABNORMAL HIGH (ref 11.5–15.5)
WBC: 8.3 10*3/uL (ref 4.0–10.5)
nRBC: 0.4 % — ABNORMAL HIGH (ref 0.0–0.2)

## 2019-08-09 LAB — BASIC METABOLIC PANEL
Anion gap: 9 (ref 5–15)
BUN: 53 mg/dL — ABNORMAL HIGH (ref 6–20)
CO2: 33 mmol/L — ABNORMAL HIGH (ref 22–32)
Calcium: 8.7 mg/dL — ABNORMAL LOW (ref 8.9–10.3)
Chloride: 106 mmol/L (ref 98–111)
Creatinine, Ser: 1.24 mg/dL (ref 0.61–1.24)
GFR calc Af Amer: >60 mL/min (ref >60)
GFR calc non Af Amer: >60 mL/min (ref >60)
Glucose, Bld: 204 mg/dL — ABNORMAL HIGH (ref 70–99)
Potassium: 3.4 mmol/L — ABNORMAL LOW (ref 3.5–5.1)
Sodium: 148 mmol/L — ABNORMAL HIGH (ref 135–145)

## 2019-08-09 LAB — GLUCOSE, CAPILLARY
Glucose-Capillary: 152 mg/dL — ABNORMAL HIGH (ref 70–99)
Glucose-Capillary: 157 mg/dL — ABNORMAL HIGH (ref 70–99)
Glucose-Capillary: 158 mg/dL — ABNORMAL HIGH (ref 70–99)
Glucose-Capillary: 172 mg/dL — ABNORMAL HIGH (ref 70–99)
Glucose-Capillary: 182 mg/dL — ABNORMAL HIGH (ref 70–99)
Glucose-Capillary: 194 mg/dL — ABNORMAL HIGH (ref 70–99)

## 2019-08-09 MED ORDER — HYDROCHLOROTHIAZIDE 25 MG PO TABS
25.0000 mg | ORAL_TABLET | Freq: Every day | ORAL | Status: DC
  Administered 2019-08-09 – 2019-08-22 (×14): 25 mg
  Filled 2019-08-09 (×15): qty 1

## 2019-08-09 MED ORDER — HYDROCHLOROTHIAZIDE 10 MG/ML ORAL SUSPENSION: 25.0000 mg | Freq: Every day | ORAL | Status: DC

## 2019-08-09 MED ORDER — POTASSIUM CHLORIDE 20 MEQ/15ML (10%) PO SOLN
40.0000 meq | Freq: Once | ORAL | Status: AC
  Administered 2019-08-09: 40 meq
  Filled 2019-08-09: qty 30

## 2019-08-09 NOTE — Progress Notes (Signed)
NAME:  Timothy Le, MRN:  [id number], DOB:  1960/03/05, LOS: 46 ADMISSION DATE:  07/30/2019, CONSULTATION DATE:  07/30/2010 REFERRING MD:  Mariane Masters, CHIEF COMPLAINT:  Acute respiratory failure   Brief History   59 year old black male with baseline chronic respiratory failure on the basis of severe OHS/OSA complicated further by significant obstructive disease in the setting of COPD.  Presents with acute on chronic hypoxic and hypercarbic respiratory failure.  Admitted with the initial working diagnosis of pulmonary edema and decompensated diastolic heart failure. Per family patient was without CPAP for 2 weeks PTA as his machine broke.  History of present illness   59 year old male patient known to pulmonary division followed for chronic respiratory failure on the basis of severe COPD, as well as significant restrictive disease by obesity hypoventilation and obstructive sleep apnea.  He is managed on BiPAP, after successfully having significant weight loss which allowed him to have tracheostomy removed following successful titration study.  He has been followed by Dr. Annamaria Boots in the outpatient setting last seen in May.  And had been doing well followed by a nutritionist and walking daily. Presents to the emergency room via EMS today on 7/11.  Apparently had been taking garbage out, on return to the house was acutely short of breath with significant wheezing.  He tried bronchodilator, also placed himself on his home BiPAP device without improvement EMS was therefore called fire arrived first finding him minimally responsive he was manually assisted ventilation with bag valve mask, given albuterol nebulizer and transferred to the emergency room.  He was unresponsive on presentation and was intubated for airway protection, marked hypoxia, and presumptive hypercarbia.  After airway was stabilized The following initial diagnostic data was identified: White blood cell count 14.4, initial venous blood gas PCO2  64, bicarbonate 28, pH 7.25, follow-up intubation arterial blood gas pH 7.22, PCO2 74, PO2 40 bicarbonate 30, pulse oximetry 63%.  Portable chest x-ray with diffuse pulmonary edema endotracheal tube in satisfactory position.  On i-STAT chemistry creatinine 1.5 up from baseline of 1.35-1.43 glucose 652. Critical Care admitted.  Past Medical History  Past medical history of Asthma, Chronic respiratory failure with hypoxia ([place]) (07/09/2017), COPD (chronic obstructive pulmonary disease) ([place]), Diabetes mellitus without complication ([place]), Difficult intubation, Hypertension, Shortness of breath dyspnea, and Sleep apnea.   Significant Hospital Events   On the night of 7/15-7/16 the patient had one event of desaturation to 70s, resolved with suctioning by RT and RN. Na increased from 147-152, free water flushes increased to 60 mL/hr. Febrile this morning to 100.7*F at 0830. Peep decreased to 8, saturations stable at 100%, FiO2 40%.  On 7/16, the patient was extubated but subsequently became stridorous. Attempted re-intubation was unsuccessful, even via FOB such that the patient had an #6 Shiley emergent trach placed by Trauma Surg. This was followed by atelectasis of the right lung, from which a blood clot was removed via FOB.  Consults:  Trauma Surg ENT  Procedures:  Endotracheal tube 7/11 Right IJ triple-lumen catheter 7/11 Trach 7/16  Significant Diagnostic Tests:  Echocardiogram 7/11 >> no evidence of RV strain; LVEF 50-55%.Left ventricular diastolic function could not be evaluated.  Lower extremity ultrasound 7/11 >> no evidence of lower extremity DVT  Micro Data:  7/13 trach aspirate - nl resp. flora  Antimicrobials:  Pip/tazo - 7/16>7/21  Interim history/subjective:  Patient moving all limbs, combative during SAT yesterday, however not with purpose. Today not following commands, eyes open, RASS -2. MRI without e/o hypoxic injury yesterday.  Objective   Blood pressure 118/67, pulse  (!) 58, temperature 99.1 F (37.3 C), temperature source Axillary, resp. rate (!) 21, height 6' (1.829 m), weight (!) 148.4 kg, SpO2 97 %.    Vent Mode: PRVC FiO2 (%):  [40 %-50 %] 50 % Set Rate:  [15 bmp] 15 bmp Vt Set:  [470 mL] 470 mL PEEP:  [8 cmH20] 8 cmH20 Pressure Support:  [12 cmH20] 12 cmH20 Plateau Pressure:  [20 cmH20-26 cmH20] 20 cmH20   Intake/Output Summary (Last 24 hours) at 08/09/2019 0850 Last data filed at 08/09/2019 0500 Gross per 24 hour  Intake 3446.96 ml  Output 1950 ml  Net 1496.96 ml   Filed Weights   08/07/19 2000 08/08/19 0233 08/09/19 0422  Weight: (!) 148.3 kg (!) 148.3 kg (!) 148.4 kg    Examination: GEN: obese chronically ill man on vent HEENT: trach in place, small bloody secretions CV: RRR, ext warm PULM: Diminished breath sounds, triggering vent GI: protuberant, soft, +BS EXT: trace edema NEURO: moves both his lower ext but not to command, opens eyes but no tracking PSYCH: RASS -2 SKIN: No rashes  Low K, high sodium CBC stable  Resolved Hospital Problem list   Lactic acidosis Mild leukocytosis  Assessment & Plan:  Acute on chronic hypoxic and hypercarbic respiratory failure requiring mechanical ventilation. S/p tracheostomy 7/16.  - PS trials as able for now - VAP prevention bundle  Acute encephalopathy-  Has had multiple periods of severe hypoxemia (admission, emergent trach) which could have caused anoxic brain injury, but possibly due to sedation. MRI yesterday w/o e/o hypoxic injury. Will wean sedation as much as possible today to assess mental capabilities, but if combative will use lowest dose necessary for protection. - Wean fentanyl, propofol to off  T2DM - Borderline hypoglycemia yesterday, 18U mealtime coverage discontinued - Lantus decreased to 40U - Continue to monitor  Acute on CKD.  Cr improved and stable at 1.24; patient has been on metolazone since at least 2019, questionable how much effect it has. Patient is only  making UOP 0.17mL/kg/hr, and is net cumulative +7.5L volume this admission. Only trace edema present, but patient also has hypernatremia that is correcting with free water flushes. Will add HCTZ as thiazide diuretic should improve UOP and decrease Na reabsorption. - continue metolazone 2.5mg  qd - start HCTZ 25 mg qd - daily BMP  Hypernatremia Continue free water flushes, now improved to 148. - see AoCKD for plan  Thyroid goiter. Causing narrowing of airway. Follow up with ENT for thyroidectomy prior to decannulation.  Hematuria. Will need repeat UA in 4-6w  Nutrition In setting of sedation and ventilation, receiving tube feeds. NGT switched for core-trak today. Must assess mentation when sedation is weaned: if no improvement, must consider PEG.  Best practice:  Diet: Vital TF Pain/Anxiety/Delirium protocol (if indicated): wean VAP protocol (if indicated): yes DVT prophylaxis: Enoxaparin GI prophylaxis: PPI Glucose control: Levemir + SSI Mobility: BR Code Status: Full Family Communication: pending Disposition: ICU  Gladys Damme, MD [facility], PGY-2

## 2019-08-09 NOTE — Progress Notes (Signed)
NAME:  Timothy Le, MRN:  [id number], DOB:  November 21, 1960, LOS: 68 ADMISSION DATE:  07/30/2019, CONSULTATION DATE:  07/30/2010 REFERRING MD:  Mariane Masters, CHIEF COMPLAINT:  Acute respiratory failure   Brief History   59 year old black male with baseline chronic respiratory failure on the basis of severe OHS/OSA complicated further by significant obstructive disease in the setting of COPD.  Presents with acute on chronic hypoxic and hypercarbic respiratory failure.  Admitted with the initial working diagnosis of pulmonary edema and decompensated diastolic heart failure. Per family patient was without CPAP for 2 weeks PTA as his machine broke.  History of present illness   59 year old male patient known to pulmonary division followed for chronic respiratory failure on the basis of severe COPD, as well as significant restrictive disease by obesity hypoventilation and obstructive sleep apnea.  He is managed on BiPAP, after successfully having significant weight loss which allowed him to have tracheostomy removed following successful titration study.  He has been followed by Dr. Annamaria Boots in the outpatient setting last seen in May.  And had been doing well followed by a nutritionist and walking daily. Presents to the emergency room via EMS today on 7/11.  Apparently had been taking garbage out, on return to the house was acutely short of breath with significant wheezing.  He tried bronchodilator, also placed himself on his home BiPAP device without improvement EMS was therefore called fire arrived first finding him minimally responsive he was manually assisted ventilation with bag valve mask, given albuterol nebulizer and transferred to the emergency room.  He was unresponsive on presentation and was intubated for airway protection, marked hypoxia, and presumptive hypercarbia.  After airway was stabilized The following initial diagnostic data was identified: White blood cell count 14.4, initial venous blood gas PCO2  64, bicarbonate 28, pH 7.25, follow-up intubation arterial blood gas pH 7.22, PCO2 74, PO2 40 bicarbonate 30, pulse oximetry 63%.  Portable chest x-ray with diffuse pulmonary edema endotracheal tube in satisfactory position.  On i-STAT chemistry creatinine 1.5 up from baseline of 1.35-1.43 glucose 652. Critical Care admitted.  Past Medical History  Past medical history of Asthma, Chronic respiratory failure with hypoxia ([place]) (07/09/2017), COPD (chronic obstructive pulmonary disease) ([place]), Diabetes mellitus without complication ([place]), Difficult intubation, Hypertension, Shortness of breath dyspnea, and Sleep apnea.   Significant Hospital Events   On the night of 7/15-7/16 the patient had one event of desaturation to 70s, resolved with suctioning by RT and RN. Na increased from 147-152, free water flushes increased to 60 mL/hr. Febrile this morning to 100.7*F at 0830. Peep decreased to 8, saturations stable at 100%, FiO2 40%.  On 7/16, the patient was extubated but subsequently became stridorous. Attempted re-intubation was unsuccessful, even via FOB such that the patient had an #6 Shiley emergent trach placed by Trauma Surg. This was followed by atelectasis of the right lung, from which a blood clot was removed via FOB.  Consults:  Trauma Surg ENT  Procedures:  Endotracheal tube 7/11 Right IJ triple-lumen catheter 7/11 Trach 7/16  Significant Diagnostic Tests:  Echocardiogram 7/11 >> no evidence of RV strain; LVEF 50-55%.Left ventricular diastolic function could not be evaluated.  Lower extremity ultrasound 7/11 >> no evidence of lower extremity DVT  Micro Data:  7/13 trach aspirate - nl resp. flora  Antimicrobials:  Pip/tazo - 7/16>7/18  Interim history/subjective:  No significant overnight events.  Remains on PS.  Objective   Blood pressure 118/64, pulse (!) 58, temperature 99.1 F (37.3 C), temperature source Axillary,  resp. rate (!) 21, height 6' (1.829 m), weight (!) 148.4 kg,  SpO2 95 %.    Vent Mode: PRVC FiO2 (%):  [40 %-50 %] 50 % Set Rate:  [15 bmp] 15 bmp Vt Set:  [470 mL] 470 mL PEEP:  [8 cmH20] 8 cmH20 Pressure Support:  [12 cmH20] 12 cmH20 Plateau Pressure:  [20 cmH20-26 cmH20] 20 cmH20   Intake/Output Summary (Last 24 hours) at 08/09/2019 1109 Last data filed at 08/09/2019 1000 Gross per 24 hour  Intake 3490.36 ml  Output 1950 ml  Net 1540.36 ml   Filed Weights   08/07/19 2000 08/08/19 0233 08/09/19 0422  Weight: (!) 148.3 kg (!) 148.3 kg (!) 148.4 kg    Examination: GEN: obese chronically ill man on vent HEENT: trach in place, small bloody secretions CV: RRR, ext warm PULM: Diminished breath sounds, decreased at the bases GI: protuberant, soft, +BS EXT: trace edema NEURO: moves both his lower ext but not to command, eyes remain open, stares into space PSYCH: RASS -1 SKIN: No rashes  Low K, high sodium CBC stable MRI results noted  Resolved Hospital Problem list   Lactic acidosis Mild leukocytosis  Assessment & Plan:  Acute on chronic hypoxic and hypercarbic respiratory failure requiring mechanical ventilation. S/p tracheostomy 7/16.  - PS trials as able for now - VAP prevention bundle -Continue weaning as tolerated -Decrease sedation as tolerated  Acute encephalopathy-  Has had multiple periods of severe hypoxemia (admission, emergent trach) which could have caused anoxic brain injury.  Or more likely this is just lingering sedative effects - Wean fentanyl, propofol to off -Still unable to follow commands but appears to be weaning -Continue to wean all sedatives  T2DM. CBGs within goal. Continue SSI and lantus  Acute on CKD. Renal function continuing to improve. Continue to monitor Hypernatremia increase free water flushes.  Continue metolazone. -Start on hydrochlorothiazide 25 daily -Continue to trend I/O  Thyroid goiter. Causing narrowing of airway. Follow up with ENT for thyroidectomy prior to  decannulation. Hematuria. Will need repeat UA in 4-6w  Discontinue Zosyn after dose today  Best practice:  Diet: Vital TF Pain/Anxiety/Delirium protocol (if indicated): wean VAP protocol (if indicated): yes DVT prophylaxis: Enoxaparin GI prophylaxis: PPI Glucose control: Levemir + SSI Mobility: BR Code Status: Full Family Communication: pending Disposition: ICU  The patient is critically ill with multiple organ systems failure and requires high complexity decision making for assessment and support, frequent evaluation and titration of therapies, application of advanced monitoring technologies and extensive interpretation of multiple databases. Critical Care Time devoted to patient care services described in this note independent of APP/resident time (if applicable)  is 30 minutes.   Sherrilyn Rist MD [facility] Personal pager: [phone] If unanswered, please page CCM On-call: [phone]

## 2019-08-09 NOTE — Procedures (Signed)
Cortrak  Person Inserting Tube:  Jaedon Siler C, RD Tube Type:  Cortrak - 43 inches Tube Location:  Left nare Initial Placement:  Stomach Secured by: Bridle Technique Used to Measure Tube Placement:  Documented cm marking at nare/ corner of mouth Cortrak Secured At:  72 cm    Cortrak Tube Team Note:  Consult received to place a Cortrak feeding tube.   No x-ray is required. RN may begin using tube.   If the tube becomes dislodged please keep the tube and contact the Cortrak team at [email or web address] (password [email or web address]) for replacement.  If after hours and replacement cannot be delayed, place a NG tube and confirm placement with an abdominal x-ray.    Hiilani Jetter P., RD, LDN, CNSC See AMiON for contact information    

## 2019-08-09 NOTE — Progress Notes (Signed)
Complete bath and linen change completed. Sacral dressing changed per MD order. Condom cath changed.

## 2019-08-10 LAB — TRIGLYCERIDES: Triglycerides: 163 mg/dL — ABNORMAL HIGH (ref <150)

## 2019-08-10 LAB — BASIC METABOLIC PANEL
Anion gap: 11 (ref 5–15)
BUN: 43 mg/dL — ABNORMAL HIGH (ref 6–20)
CO2: 30 mmol/L (ref 22–32)
Calcium: 8.8 mg/dL — ABNORMAL LOW (ref 8.9–10.3)
Chloride: 105 mmol/L (ref 98–111)
Creatinine, Ser: 1.15 mg/dL (ref 0.61–1.24)
GFR calc Af Amer: >60 mL/min (ref >60)
GFR calc non Af Amer: >60 mL/min (ref >60)
Glucose, Bld: 152 mg/dL — ABNORMAL HIGH (ref 70–99)
Potassium: 3.8 mmol/L (ref 3.5–5.1)
Sodium: 146 mmol/L — ABNORMAL HIGH (ref 135–145)

## 2019-08-10 LAB — CBC
HCT: 32.3 % — ABNORMAL LOW (ref 39.0–52.0)
Hemoglobin: 9.3 g/dL — ABNORMAL LOW (ref 13.0–17.0)
MCH: 27.2 pg (ref 26.0–34.0)
MCHC: 28.8 g/dL — ABNORMAL LOW (ref 30.0–36.0)
MCV: 94.4 fL (ref 80.0–100.0)
Platelets: 222 10*3/uL (ref 150–400)
RBC: 3.42 MIL/uL — ABNORMAL LOW (ref 4.22–5.81)
RDW: 16.5 % — ABNORMAL HIGH (ref 11.5–15.5)
WBC: 8.3 10*3/uL (ref 4.0–10.5)
nRBC: 0.5 % — ABNORMAL HIGH (ref 0.0–0.2)

## 2019-08-10 LAB — GLUCOSE, CAPILLARY
Glucose-Capillary: 102 mg/dL — ABNORMAL HIGH (ref 70–99)
Glucose-Capillary: 123 mg/dL — ABNORMAL HIGH (ref 70–99)
Glucose-Capillary: 141 mg/dL — ABNORMAL HIGH (ref 70–99)
Glucose-Capillary: 174 mg/dL — ABNORMAL HIGH (ref 70–99)
Glucose-Capillary: 193 mg/dL — ABNORMAL HIGH (ref 70–99)
Glucose-Capillary: 209 mg/dL — ABNORMAL HIGH (ref 70–99)
Glucose-Capillary: 224 mg/dL — ABNORMAL HIGH (ref 70–99)

## 2019-08-10 MED ORDER — HALOPERIDOL LACTATE 5 MG/ML IJ SOLN
5.0000 mg | Freq: Once | INTRAMUSCULAR | Status: AC
  Administered 2019-08-10: 5 mg via INTRAVENOUS
  Filled 2019-08-10: qty 1

## 2019-08-10 MED ORDER — IPRATROPIUM-ALBUTEROL 0.5-2.5 (3) MG/3ML IN SOLN
3.0000 mL | Freq: Four times a day (QID) | RESPIRATORY_TRACT | Status: DC
  Administered 2019-08-10 – 2019-08-17 (×27): 3 mL via RESPIRATORY_TRACT
  Filled 2019-08-10 (×28): qty 3

## 2019-08-10 MED ORDER — CLONAZEPAM 0.5 MG PO TABS
0.5000 mg | ORAL_TABLET | Freq: Two times a day (BID) | ORAL | Status: DC
  Administered 2019-08-10 – 2019-08-11 (×3): 0.5 mg via NASOGASTRIC
  Filled 2019-08-10 (×3): qty 1

## 2019-08-10 MED ORDER — FENTANYL BOLUS VIA INFUSION
100.0000 ug | INTRAVENOUS | Status: DC | PRN
  Administered 2019-08-10 – 2019-08-12 (×13): 100 ug via INTRAVENOUS
  Filled 2019-08-10: qty 100

## 2019-08-10 NOTE — Progress Notes (Signed)
eLink Physician-Brief Progress Note Patient Name: Timothy Le DOB: [birth year] MRN: [id number]   Date of Service  08/10/2019  HPI/Events of Note  Severe agitation - Patient is thrashing around in the bed. QTc interval = 0.472 seconds.   eICU Interventions  Plan: 1. Haldol 5 mg IV X 1 now.  2. Bilateral soft wrist restraints X 6 hours.     Intervention Category Major Interventions: Delirium, psychosis, severe agitation - evaluation and management  [name] 08/10/2019, 2:22 AM

## 2019-08-10 NOTE — Progress Notes (Signed)
NAME:  Timothy Le, MRN:  [id number], DOB:  May 19, 1960, LOS: 32 ADMISSION DATE:  07/30/2019, CONSULTATION DATE:  07/30/2010 REFERRING MD:  Timothy Le, CHIEF COMPLAINT:  Acute respiratory failure   Brief History   59 year old black male with baseline chronic respiratory failure on the basis of severe OHS/OSA complicated further by significant obstructive disease in the setting of COPD.  Presents with acute on chronic hypoxic and hypercarbic respiratory failure.  Admitted with the initial working diagnosis of pulmonary edema and decompensated diastolic heart failure. Per family patient was without CPAP for 2 weeks PTA as his machine broke.  History of present illness   59 year old male patient known to pulmonary division followed for chronic respiratory failure on the basis of severe COPD, as well as significant restrictive disease by obesity hypoventilation and obstructive sleep apnea.  He is managed on BiPAP, after successfully having significant weight loss which allowed him to have tracheostomy removed following successful titration study.  He has been followed by Dr. Annamaria Boots in the outpatient setting last seen in May.  And had been doing well followed by a nutritionist and walking daily. Presents to the emergency room via EMS today on 7/11.  Apparently had been taking garbage out, on return to the house was acutely short of breath with significant wheezing.  He tried bronchodilator, also placed himself on his home BiPAP device without improvement EMS was therefore called fire arrived first finding him minimally responsive he was manually assisted ventilation with bag valve mask, given albuterol nebulizer and transferred to the emergency room.  He was unresponsive on presentation and was intubated for airway protection, marked hypoxia, and presumptive hypercarbia.  After airway was stabilized The following initial diagnostic data was identified: White blood cell count 14.4, initial venous blood gas PCO2  64, bicarbonate 28, pH 7.25, follow-up intubation arterial blood gas pH 7.22, PCO2 74, PO2 40 bicarbonate 30, pulse oximetry 63%.  Portable chest x-ray with diffuse pulmonary edema endotracheal tube in satisfactory position.  On i-STAT chemistry creatinine 1.5 up from baseline of 1.35-1.43 glucose 652. Critical Care admitted.  Past Medical History  Past medical history of Asthma, Chronic respiratory failure with hypoxia ([place]) (07/09/2017), COPD (chronic obstructive pulmonary disease) ([place]), Diabetes mellitus without complication ([place]), Difficult intubation, Hypertension, Shortness of breath dyspnea, and Sleep apnea.   Significant Hospital Events   On the night of 7/15-7/16 the patient had one event of desaturation to 70s, resolved with suctioning by RT and RN. Na increased from 147-152, free water flushes increased to 60 mL/hr. Febrile this morning to 100.7*F at 0830. Peep decreased to 8, saturations stable at 100%, FiO2 40%.  On 7/16, the patient was extubated but subsequently became stridorous. Attempted re-intubation was unsuccessful, even via FOB such that the patient had an #6 Shiley emergent trach placed by Trauma Surg. This was followed by atelectasis of the right lung, from which a blood clot was removed via FOB.  Consults:  Trauma Surg ENT  Procedures:  Endotracheal tube 7/11 Right IJ triple-lumen catheter 7/11 Trach 7/16  Significant Diagnostic Tests:  Echocardiogram 7/11 >> no evidence of RV strain; LVEF 50-55%.Left ventricular diastolic function could not be evaluated.  Lower extremity ultrasound 7/11 >> no evidence of lower extremity DVT  Micro Data:  7/13 trach aspirate - nl resp. flora  Antimicrobials:  Pip/tazo - 7/16>7/18  Interim history/subjective:  No significant overnight events.  Remains on PS. Significant agitation with trying to wean sedatives  Objective   Blood pressure 120/74, pulse 56, temperature 99.5  F (37.5 C), temperature source Axillary, resp. rate  (!) 24, height 6' (1.829 m), weight (!) 149.1 kg, SpO2 92 %.    Vent Mode: PRVC FiO2 (%):  [40 %-50 %] 50 % Set Rate:  [15 bmp] 15 bmp Vt Set:  [470 mL] 470 mL PEEP:  [8 cmH20] 8 cmH20 Plateau Pressure:  [20 cmH20-22 cmH20] 20 cmH20   Intake/Output Summary (Last 24 hours) at 08/10/2019 1638 Last data filed at 08/10/2019 1433 Gross per 24 hour  Intake 3254.89 ml  Output 3250 ml  Net 4.89 ml   Filed Weights   08/08/19 0233 08/09/19 0422 08/10/19 0414  Weight: (!) 148.3 kg (!) 148.4 kg (!) 149.1 kg    Examination: GEN: obese chronically ill man on vent HEENT: Endotracheal tube in place CV: S1-S2 appreciated PULM: Diminished breath sounds, decreased at the bases GI: protuberant, soft, +BS EXT: trace edema NEURO: moves both his lower ext but not to command, eyes remain open, stares into space PSYCH: RASS -1 SKIN: No rashes  MRI shows no acute issues  Resolved Hospital Problem list   Lactic acidosis Mild leukocytosis  Assessment & Plan:  Acute on chronic hypoxic and hypercarbic respiratory failure requiring mechanical ventilation. S/p tracheostomy 7/16.  - PS trials as able for now - VAP prevention bundle -Continue weaning as tolerated -Decrease sedation as tolerated  Acute encephalopathy-  Has had multiple periods of severe hypoxemia (admission, emergent trach) which could have caused anoxic brain injury.  Or more likely this is just lingering sedative effects - Wean fentanyl, propofol to off -Continues to wean  T2DM. CBGs within goal. Continue SSI and lantus  Acute on CKD. Renal function continuing to improve. Continue to monitor Hypernatremia increase free water flushes.  Continue metolazone. -Start on hydrochlorothiazide 25 daily -Continue to trend I/O  Thyroid goiter. Causing narrowing of airway. Follow up with ENT for thyroidectomy prior to decannulation. Hematuria. Will need repeat UA in 4-6w  Zosyn completed  Best practice:  Diet: Vital  TF Pain/Anxiety/Delirium protocol (if indicated): wean VAP protocol (if indicated): yes DVT prophylaxis: Enoxaparin GI prophylaxis: PPI Glucose control: Levemir + SSI Mobility: BR Code Status: Full Family Communication: pending Disposition: ICU  The patient is critically ill with multiple organ systems failure and requires high complexity decision making for assessment and support, frequent evaluation and titration of therapies, application of advanced monitoring technologies and extensive interpretation of multiple databases. Critical Care Time devoted to patient care services described in this note independent of APP/resident time (if applicable)  is 33 minutes.   Sherrilyn Rist MD [facility] Personal pager: [phone] If unanswered, please page CCM On-call: [phone]

## 2019-08-11 ENCOUNTER — Inpatient Hospital Stay: Payer: Self-pay

## 2019-08-11 LAB — GLUCOSE, CAPILLARY
Glucose-Capillary: 146 mg/dL — ABNORMAL HIGH (ref 70–99)
Glucose-Capillary: 152 mg/dL — ABNORMAL HIGH (ref 70–99)
Glucose-Capillary: 165 mg/dL — ABNORMAL HIGH (ref 70–99)
Glucose-Capillary: 172 mg/dL — ABNORMAL HIGH (ref 70–99)
Glucose-Capillary: 175 mg/dL — ABNORMAL HIGH (ref 70–99)
Glucose-Capillary: 177 mg/dL — ABNORMAL HIGH (ref 70–99)

## 2019-08-11 LAB — CBC
HCT: 30.9 % — ABNORMAL LOW (ref 39.0–52.0)
Hemoglobin: 9 g/dL — ABNORMAL LOW (ref 13.0–17.0)
MCH: 27.5 pg (ref 26.0–34.0)
MCHC: 29.1 g/dL — ABNORMAL LOW (ref 30.0–36.0)
MCV: 94.5 fL (ref 80.0–100.0)
Platelets: 239 10*3/uL (ref 150–400)
RBC: 3.27 MIL/uL — ABNORMAL LOW (ref 4.22–5.81)
RDW: 16 % — ABNORMAL HIGH (ref 11.5–15.5)
WBC: 10.6 10*3/uL — ABNORMAL HIGH (ref 4.0–10.5)
nRBC: 0.4 % — ABNORMAL HIGH (ref 0.0–0.2)

## 2019-08-11 LAB — BASIC METABOLIC PANEL
Anion gap: 8 (ref 5–15)
BUN: 42 mg/dL — ABNORMAL HIGH (ref 6–20)
CO2: 32 mmol/L (ref 22–32)
Calcium: 8.8 mg/dL — ABNORMAL LOW (ref 8.9–10.3)
Chloride: 101 mmol/L (ref 98–111)
Creatinine, Ser: 1.17 mg/dL (ref 0.61–1.24)
GFR calc Af Amer: >60 mL/min (ref >60)
GFR calc non Af Amer: >60 mL/min (ref >60)
Glucose, Bld: 214 mg/dL — ABNORMAL HIGH (ref 70–99)
Potassium: 3.8 mmol/L (ref 3.5–5.1)
Sodium: 141 mmol/L (ref 135–145)

## 2019-08-11 MED ORDER — OXYCODONE HCL 5 MG PO TABS
5.0000 mg | ORAL_TABLET | Freq: Four times a day (QID) | ORAL | Status: DC
  Administered 2019-08-11 – 2019-09-01 (×83): 5 mg
  Filled 2019-08-11 (×83): qty 1

## 2019-08-11 MED ORDER — ATROPINE SULFATE 0.4 MG/ML IJ SOLN
0.4000 mg | Freq: Once | INTRAMUSCULAR | Status: DC
  Filled 2019-08-11: qty 1

## 2019-08-11 MED ORDER — ALBUMIN HUMAN 25 % IV SOLN: 25.0000 g | Freq: Once | INTRAVENOUS | Status: DC

## 2019-08-11 MED ORDER — CLONAZEPAM 1 MG PO TABS
1.0000 mg | ORAL_TABLET | Freq: Two times a day (BID) | ORAL | Status: DC
  Administered 2019-08-11: 1 mg via NASOGASTRIC
  Filled 2019-08-11: qty 1

## 2019-08-11 NOTE — Procedures (Signed)
Bronchoscopy Procedure Note  Timothy Le  [id number]  Aug 23, 1960  Date:08/11/19  Time:2:48 PM   Provider Performing:[name] [name]   Procedure(s):  Flexible Bronchoscopy [phone])  Indication(s) Mucus plugging  Consent Unable to obtain consent due to emergent nature of procedure.  Anesthesia None On ventilator, fentanyl and precedex   Time Out Verified patient identification, verified procedure, emergent procedure   Sterile Technique Usual hand hygiene, masks, gowns, and gloves were used   Procedure Description Bronchoscope advanced through tracheostomy tube and into airway.  Airways were examined down to subsegmental level with findings noted below.   Following diagnostic evaluation, BAL(s) performed in LLL with normal saline and return of 30cc fluid  Findings: some plugs suctioned from airway requiring removal of The bronchoscope from airway to dislodge some of the plugs   Complications/Tolerance None; patient tolerated the procedure well. Chest X-ray is not needed post procedure.   EBL Minimal   Specimen(s) Washings performed from the left lower lobe and sent for analysis

## 2019-08-11 NOTE — Progress Notes (Signed)
Patient developed a mucous plug  Nurse was trying to suction patient and could not pass the suction catheter through the tracheostomy tube  Patient was taken off the ventilator to bag and was impossible to bag  Aliquots of saline instilled into the airway with frequent suctioning  Prior to being able to dislodge the mucous plug patient did start to desaturate and a code was called  Plug was finally dislodged and we were able to ventilate the patient He did not lose his pulse  Patient saturations did improve and stayed in the 90s and patient was placed back on the ventilator.  Bronchoscopy was performed revealing some plugs which were successfully removed from airway  Currently, hemodynamically stable, stable respiratory status on the ventilator

## 2019-08-11 NOTE — Progress Notes (Signed)
eLink Physician-Brief Progress Note Patient Name: Timothy Le DOB: 25-Dec-1960 MRN: [id number]   Date of Service  08/11/2019  HPI/Events of Note  Patient needs a.m. BMET  eICU Interventions  BMET ordered,        Frederik Pear 08/11/2019, 5:21 AM

## 2019-08-11 NOTE — Progress Notes (Signed)
NAME:  Timothy Le, MRN:  [id number], DOB:  Nov 24, 1960, LOS: 12 ADMISSION DATE:  07/30/2019, CONSULTATION DATE:  07/30/2010 REFERRING MD:  Mariane Masters, CHIEF COMPLAINT:  Acute respiratory failure   Brief History   59 year old black male with baseline chronic respiratory failure on the basis of severe OHS/OSA complicated further by significant obstructive disease in the setting of COPD.  Presents with acute on chronic hypoxic and hypercarbic respiratory failure.  Admitted with the initial working diagnosis of pulmonary edema and decompensated diastolic heart failure. Per family patient was without CPAP for 2 weeks PTA as his machine broke.  History of present illness   59 year old male patient known to pulmonary division followed for chronic respiratory failure on the basis of severe COPD, as well as significant restrictive disease by obesity hypoventilation and obstructive sleep apnea.  He is managed on BiPAP, after successfully having significant weight loss which allowed him to have tracheostomy removed following successful titration study.  He has been followed by Dr. Annamaria Boots in the outpatient setting last seen in May.  And had been doing well followed by a nutritionist and walking daily. Presents to the emergency room via EMS today on 7/11.  Apparently had been taking garbage out, on return to the house was acutely short of breath with significant wheezing.  He tried bronchodilator, also placed himself on his home BiPAP device without improvement EMS was therefore called fire arrived first finding him minimally responsive he was manually assisted ventilation with bag valve mask, given albuterol nebulizer and transferred to the emergency room.  He was unresponsive on presentation and was intubated for airway protection, marked hypoxia, and presumptive hypercarbia.  After airway was stabilized The following initial diagnostic data was identified: White blood cell count 14.4, initial venous blood gas PCO2  64, bicarbonate 28, pH 7.25, follow-up intubation arterial blood gas pH 7.22, PCO2 74, PO2 40 bicarbonate 30, pulse oximetry 63%.  Portable chest x-ray with diffuse pulmonary edema endotracheal tube in satisfactory position.  On i-STAT chemistry creatinine 1.5 up from baseline of 1.35-1.43 glucose 652. Critical Care admitted.  Past Medical History  Past medical history of Asthma, Chronic respiratory failure with hypoxia ([place]) (07/09/2017), COPD (chronic obstructive pulmonary disease) ([place]), Diabetes mellitus without complication ([place]), Difficult intubation, Hypertension, Shortness of breath dyspnea, and Sleep apnea.   Significant Hospital Events   On the night of 7/15-7/16 the patient had one event of desaturation to 70s, resolved with suctioning by RT and RN. Na increased from 147-152, free water flushes increased to 60 mL/hr. Febrile this morning to 100.7*F at 0830. Peep decreased to 8, saturations stable at 100%, FiO2 40%.  On 7/16, the patient was extubated but subsequently became stridorous. Attempted re-intubation was unsuccessful, even via FOB such that the patient had an #6 Shiley emergent trach placed by Trauma Surg. This was followed by atelectasis of the right lung, from which a blood clot was removed via FOB.  Consults:  Trauma Surg ENT  Procedures:  Endotracheal tube 7/11 Right IJ triple-lumen catheter 7/11 Trach 7/16  Significant Diagnostic Tests:  Echocardiogram 7/11 >> no evidence of RV strain; LVEF 50-55%.Left ventricular diastolic function could not be evaluated.  Lower extremity ultrasound 7/11 >> no evidence of lower extremity DVT  Micro Data:  7/13 trach aspirate - nl resp. flora  Antimicrobials:  Pip/tazo - 7/16>7/21  Interim history/subjective:  No significant overnight events.  Remains on PS. Intermittently agitated to +2, flailing arms and legs, occasionally following commands.  Objective   Blood pressure Marland Kitchen)  99/60, pulse (!) 25, temperature 98.6 F (37 C),  temperature source Oral, resp. rate 21, height 6' (1.829 m), weight (!) 150.8 kg, SpO2 95 %.    Vent Mode: PRVC FiO2 (%):  [40 %-50 %] 50 % Set Rate:  [5 bmp-15 bmp] 15 bmp Vt Set:  [470 mL] 470 mL PEEP:  [5 cmH20-8 cmH20] 5 cmH20 Plateau Pressure:  [19 cmH20-22 cmH20] 19 cmH20   Intake/Output Summary (Last 24 hours) at 08/11/2019 7564 Last data filed at 08/11/2019 0800 Gross per 24 hour  Intake 3363.78 ml  Output 2850 ml  Net 513.78 ml   Filed Weights   08/09/19 0422 08/10/19 0414 08/11/19 0500  Weight: (!) 148.4 kg (!) 149.1 kg (!) 150.8 kg    Examination: GEN: obese chronically ill man on vent HEENT: Endotracheal tube in place CV: S1-S2 appreciated PULM: Diminished breath sounds, decreased at the bases GI: protuberant, soft, +BS EXT: trace edema NEURO: moves both his lower ext but not to command, eyes remain open, stares into space, per RN protruded tongue on command this AM PSYCH: RASS +2 to -1 during exam SKIN: No rashes  MRI shows no acute issues  Resolved Hospital Problem list   Lactic acidosis Mild leukocytosis  Assessment & Plan:  Acute on chronic hypoxic and hypercarbic respiratory failure requiring mechanical ventilation. S/p tracheostomy 7/16.  Patient moving arms and legs, fighting vent, not following commands, received fentanyl bolus and RASS decreased from +2 to -1. - PS trials as able for now - VAP prevention bundle -Continue weaning as tolerated -Decrease sedation as tolerated -Klonopin started yesterday to help wean from sedation: increase to 1 mg BID  Acute encephalopathy-  Has had multiple periods of severe hypoxemia (admission, emergent trach) which could have caused anoxic brain injury.  Or more likely this is just lingering sedative effects - Wean fentanyl, propofol to off -Continue to wean as able  T2DM. CBGs close to goal. Continue SSI and lantus  Acute on CKD  -Renal function continuing to improve. Continue to monitor  Hypernatremia -  resolved.  -Continue free water flushes.  Continue metolazone. -Continue hydrochlorothiazide 25 daily -Continue to trend I/O  Thyroid goiter. Causing narrowing of airway. Follow up with ENT for thyroidectomy prior to decannulation.  Hematuria. Will need repeat UA in 4-6w  Zosyn completed  Best practice:  Diet: Vital TF Pain/Anxiety/Delirium protocol (if indicated): wean VAP protocol (if indicated): yes DVT prophylaxis: Enoxaparin GI prophylaxis: PPI Glucose control: Levemir + SSI Mobility: BR Code Status: Full Family Communication: pending Disposition: ICU  Gladys Damme, MD [facility], PGY-2

## 2019-08-12 ENCOUNTER — Ambulatory Visit: Payer: Self-pay | Admitting: Otolaryngology

## 2019-08-12 ENCOUNTER — Inpatient Hospital Stay (HOSPITAL_COMMUNITY): Payer: Medicaid Other

## 2019-08-12 LAB — GLUCOSE, CAPILLARY
Glucose-Capillary: 154 mg/dL — ABNORMAL HIGH (ref 70–99)
Glucose-Capillary: 220 mg/dL — ABNORMAL HIGH (ref 70–99)
Glucose-Capillary: 178 mg/dL — ABNORMAL HIGH (ref 70–99)
Glucose-Capillary: 150 mg/dL — ABNORMAL HIGH (ref 70–99)
Glucose-Capillary: 130 mg/dL — ABNORMAL HIGH (ref 70–99)
Glucose-Capillary: 158 mg/dL — ABNORMAL HIGH (ref 70–99)

## 2019-08-12 LAB — CBC
HCT: 32 % — ABNORMAL LOW (ref 39.0–52.0)
Hemoglobin: 9.5 g/dL — ABNORMAL LOW (ref 13.0–17.0)
MCH: 27.9 pg (ref 26.0–34.0)
MCHC: 29.7 g/dL — ABNORMAL LOW (ref 30.0–36.0)
MCV: 93.8 fL (ref 80.0–100.0)
Platelets: 270 10*3/uL (ref 150–400)
RBC: 3.41 MIL/uL — ABNORMAL LOW (ref 4.22–5.81)
RDW: 15.5 % (ref 11.5–15.5)
WBC: 13.2 10*3/uL — ABNORMAL HIGH (ref 4.0–10.5)
nRBC: 0.2 % (ref 0.0–0.2)

## 2019-08-12 LAB — BASIC METABOLIC PANEL WITH GFR
Anion gap: 11 (ref 5–15)
BUN: 38 mg/dL — ABNORMAL HIGH (ref 6–20)
CO2: 30 mmol/L (ref 22–32)
Calcium: 8.9 mg/dL (ref 8.9–10.3)
Chloride: 98 mmol/L (ref 98–111)
Creatinine, Ser: 1.15 mg/dL (ref 0.61–1.24)
GFR calc Af Amer: >60 mL/min
GFR calc non Af Amer: >60 mL/min
Glucose, Bld: 147 mg/dL — ABNORMAL HIGH (ref 70–99)
Potassium: 3.2 mmol/L — ABNORMAL LOW (ref 3.5–5.1)
Sodium: 139 mmol/L (ref 135–145)

## 2019-08-12 MED ORDER — MIDAZOLAM HCL 2 MG/2ML IJ SOLN
INTRAMUSCULAR | Status: AC
  Administered 2019-08-12: 2 mg via INTRAVENOUS
  Filled 2019-08-12: qty 4

## 2019-08-12 MED ORDER — DOCUSATE SODIUM 50 MG/5ML PO LIQD
100.0000 mg | Freq: Two times a day (BID) | ORAL | Status: DC
  Administered 2019-08-12 – 2019-08-16 (×9): 100 mg via ORAL
  Filled 2019-08-12 (×9): qty 10

## 2019-08-12 MED ORDER — FENTANYL CITRATE (PF) 100 MCG/2ML IJ SOLN
INTRAMUSCULAR | Status: AC
  Administered 2019-08-12: 50 ug via INTRAVENOUS
  Filled 2019-08-12: qty 2

## 2019-08-12 MED ORDER — CLONAZEPAM 1 MG PO TABS
2.0000 mg | ORAL_TABLET | Freq: Two times a day (BID) | ORAL | Status: DC
  Administered 2019-08-12 – 2019-08-14 (×6): 2 mg via NASOGASTRIC
  Filled 2019-08-12 (×6): qty 2

## 2019-08-12 MED ORDER — INSULIN ASPART 100 UNIT/ML ~~LOC~~ SOLN
4.0000 [IU] | SUBCUTANEOUS | Status: DC
  Administered 2019-08-12 – 2019-08-17 (×29): 4 [IU] via SUBCUTANEOUS

## 2019-08-12 MED ORDER — FENTANYL CITRATE (PF) 100 MCG/2ML IJ SOLN: 50.0000 ug | Freq: Once | INTRAMUSCULAR | Status: AC

## 2019-08-12 MED ORDER — POTASSIUM CHLORIDE 10 MEQ/100ML IV SOLN
10.0000 meq | INTRAVENOUS | Status: AC
  Administered 2019-08-12 (×4): 10 meq via INTRAVENOUS
  Filled 2019-08-12 (×3): qty 100

## 2019-08-12 MED ORDER — POLYETHYLENE GLYCOL 3350 17 G PO PACK
17.0000 g | PACK | Freq: Every day | ORAL | Status: DC
  Administered 2019-08-12 – 2019-09-15 (×16): 17 g
  Filled 2019-08-12 (×20): qty 1

## 2019-08-12 MED ORDER — MIDAZOLAM HCL 2 MG/2ML IJ SOLN
2.0000 mg | INTRAMUSCULAR | Status: DC | PRN
  Administered 2019-08-12 – 2019-08-13 (×9): 2 mg via INTRAVENOUS
  Filled 2019-08-12 (×9): qty 2

## 2019-08-12 MED ORDER — SODIUM CHLORIDE 0.9 % IV SOLN
2.0000 g | Freq: Three times a day (TID) | INTRAVENOUS | Status: DC
  Administered 2019-08-12 – 2019-08-13 (×3): 2 g via INTRAVENOUS
  Filled 2019-08-12 (×3): qty 2

## 2019-08-12 MED ORDER — HYDROMORPHONE BOLUS VIA INFUSION
0.5000 mg | INTRAVENOUS | Status: DC | PRN
  Administered 2019-08-12 – 2019-08-14 (×7): 0.5 mg via INTRAVENOUS
  Filled 2019-08-12: qty 1

## 2019-08-12 MED ORDER — SODIUM CHLORIDE 0.9 % IV SOLN
0.5000 mg/h | INTRAVENOUS | Status: DC
  Administered 2019-08-12: 0.5 mg/h via INTRAVENOUS
  Administered 2019-08-13: 4 mg/h via INTRAVENOUS
  Administered 2019-08-13: 3 mg/h via INTRAVENOUS
  Administered 2019-08-14 (×2): 4 mg/h via INTRAVENOUS
  Administered 2019-08-15: 3 mg/h via INTRAVENOUS
  Filled 2019-08-12 (×9): qty 5

## 2019-08-12 MED ORDER — MIDAZOLAM HCL 2 MG/2ML IJ SOLN: 4.0000 mg | Freq: Once | INTRAMUSCULAR | Status: AC

## 2019-08-12 MED ORDER — MIDAZOLAM HCL 2 MG/2ML IJ SOLN
2.0000 mg | INTRAMUSCULAR | Status: AC | PRN
  Administered 2019-08-12 (×2): 2 mg via INTRAVENOUS
  Filled 2019-08-12: qty 2

## 2019-08-12 MED ORDER — HYDROMORPHONE HCL 1 MG/ML IJ SOLN
1.0000 mg | Freq: Once | INTRAMUSCULAR | Status: AC
  Administered 2019-08-12: 1 mg via INTRAVENOUS

## 2019-08-12 MED ORDER — POTASSIUM CHLORIDE 20 MEQ/15ML (10%) PO SOLN
20.0000 meq | ORAL | Status: AC
  Administered 2019-08-12 (×2): 20 meq
  Filled 2019-08-12 (×2): qty 15

## 2019-08-12 MED ORDER — MIDAZOLAM HCL 2 MG/2ML IJ SOLN
INTRAMUSCULAR | Status: AC
  Administered 2019-08-12: 4 mg via INTRAVENOUS
  Filled 2019-08-12: qty 4

## 2019-08-12 MED ORDER — ETOMIDATE 2 MG/ML IV SOLN
20.0000 mg | Freq: Once | INTRAVENOUS | Status: AC
  Administered 2019-08-12: 20 mg via INTRAVENOUS

## 2019-08-12 MED FILL — Medication: Qty: 1 | Status: AC

## 2019-08-12 NOTE — Progress Notes (Signed)
Dr. Lake Bells made aware of addendum to chest xray to assess PICC line placement. MD okay to move lines to PICC line and remove Right IJ CVC

## 2019-08-12 NOTE — Plan of Care (Signed)
  Problem: Nutrition: Goal: Adequate nutrition will be maintained Outcome: Progressing Note: Pt is tolerating tube feeds well.    Problem: Elimination: Goal: Will not experience complications related to bowel motility Outcome: Progressing Goal: Will not experience complications related to urinary retention Outcome: Progressing   Problem: Clinical Measurements: Goal: Respiratory complications will improve Outcome: Not Progressing Note: Pt was emergently intubated this morning with trach removal due to dislodgement. Currently comfortable and tolerating ventilator well.    Problem: Activity: Goal: Risk for activity intolerance will decrease Outcome: Not Progressing Note: Unable to mobilize patient at this time. Emergently intubated this morning.

## 2019-08-12 NOTE — Procedures (Signed)
Intubation Procedure Note Timothy Le [id number] 10/09/1960  Procedure: Intubation Indications: lost tracheostomy, acute respiratory failure with hypoxemia  Procedure Details Consent: Unable to obtain consent because of emergent medical necessity. Time Out: Verified patient identification, verified procedure, site/side was marked, verified correct patient position, special equipment/implants available, medications/allergies/relevent history reviewed, required imaging and test results available.  Performed  Drugs versed 4mg  IV, fentanyl 6mcg IV, etomidate 20mg  IV DL x 1 with GS 3 blade Grade 1 view 7.5 ET tube passed through cords under direct visualization Placement confirmed with bilateral breath sounds, positive EtCO2 change and smoke in tube   Evaluation Hemodynamic Status: BP stable throughout; O2 sats: transiently fell during during procedure Patient's Current Condition: stable Complications: No apparent complications Patient did tolerate procedure well. Chest X-ray ordered to verify placement.  CXR: pending.   Roselie Awkward 08/12/2019

## 2019-08-12 NOTE — Progress Notes (Signed)
NAME:  Timothy Le, MRN:  [id number], DOB:  Jan 13, 1961, LOS: 43 ADMISSION DATE:  07/30/2019, CONSULTATION DATE:  7/11 REFERRING MD:  Kathrynn Humble, CHIEF COMPLAINT:  Dyspnea   Brief History   59 year old black male with baseline chronic respiratory failure on the basis of severe OHS/OSA complicated further by significant obstructive disease in the setting of COPD. Presents with acute on chronic hypoxic and hypercarbic respiratory failure. Admitted with the initial working diagnosis of pulmonary edema and decompensated diastolic heart failure. Per family patient was without CPAP for 2 weeks PTA as his machine broke. Has required mechanical ventilation for most of the hospitalization, extubated on 7/16, had stridor, unsuccessful re-intubation, had emergent tracheostomy which was difficult.  On 7/24 he became agitated and his tracheostomy dislodged, required emergent re-intubation.  Past Medical History  Past medical history of Asthma, Chronic respiratory failure with hypoxia ([place]) (07/09/2017), COPD (chronic obstructive pulmonary disease) ([place]), Diabetes mellitus without complication ([place]), Difficult intubation, Hypertension, Shortness of breath dyspnea, and Sleep apnea  Significant Hospital Events   On the night of 7/15-7/16 the patient had one event of desaturation to 70s, resolved with suctioning by RT and RN. Na increased from 147-152, free water flushes increased to 60 mL/hr. Febrile this morning to 100.7*F at 0830. Peep decreased to 8, saturations stable at 100%, FiO2 40%.  On 7/16, the patient was extubated but subsequently became stridorous. Attempted re-intubation was unsuccessful, even via FOB such that the patient had an #6 Shiley emergent trach placed by Trauma Surg. This was followed by atelectasis of the right lung, from which a blood clot was removed via FOB.  7/23 bronch mucus plug  7/24 period of lucidity around 0530, then severely agitated, tracheostomy dislodged, emergently  re-intubated  Consults:  Trauma Surg ENT  Procedures:  Endotracheal tube 7/11 Right IJ triple-lumen catheter 7/11 Trach 7/16 > 7/24 ETT 7/24 >   Significant Diagnostic Tests:  Echocardiogram 7/11 >> no evidence of RV strain; LVEF 50-55%.Left ventricular diastolic function could not be evaluated.  Lower extremity ultrasound 7/11 >> no evidence of lower extremity DVT MRI brain 7/20 > normal MRI brain, extensive sinus mucosal disease, bilateral mastoid effusion, bilateral proptosis  Micro Data:  7/11 SARS COV 2> negative 7/13 resp culture > OPF 7/23 BAL LLL > GPC   Antimicrobials:  Pip/tazo - 7/16>7/21  Cefepime 7/24 >   Interim history/subjective:  Acutely agitated this morning, tracheostomy dislodged, emergently re-intubated  Objective   Blood pressure 121/82, pulse 58, temperature 98.7 F (37.1 C), temperature source Oral, resp. rate 15, height 6' (1.829 m), weight (!) 152.1 kg, SpO2 99 %.    Vent Mode: PRVC FiO2 (%):  [40 %-50 %] 50 % Set Rate:  [15 bmp] 15 bmp Vt Set:  [470 mL] 470 mL PEEP:  [5 cmH20] 5 cmH20 Plateau Pressure:  [14 cmH20-20 cmH20] 20 cmH20   Intake/Output Summary (Last 24 hours) at 08/12/2019 3094 Last data filed at 08/12/2019 0400 Gross per 24 hour  Intake 2612.34 ml  Output 1950 ml  Net 662.34 ml   Filed Weights   08/10/19 0414 08/11/19 0500 08/12/19 0500  Weight: (!) 149.1 kg (!) 150.8 kg (!) 152.1 kg    Examination:  General:  Morbidly obese, in bed on vent HENT: NCAT ETT in place PULM: CTA B, vent supported breathing CV: RRR, no mgr GI: BS+, soft, nontender MSK: normal bulk and tone Neuro: sedated on vent  CXR reviewed> left lower lobe infiltrate, ett in place, PICC/R IJ in place, nasal gastric  tube in place  Resolved Hospital Problem list     Assessment & Plan:  Acute on chronic respiratory failure with hypoxemia and hypercarbia in setting of severe OHS and COPD Prolonged mechanical ventilation Tracheostomy placed 7/16,  difficult; dislodged on 7/24 Maintain oral airway over weekend Will discuss with ENT> revision next week Keep sedated Full mechanical vent support VAP prevention Daily WUA/SBT  Acute metabolic encephalopathy> had period of lucidity this morning Keep heavily sedated over weekend until he can get tracheostomy again PAD protocol, RASS target -2 to -3 Stop fentanyl infusion Start dilaudid infusion Continue precedex Prn versed  DM2 with hyperglycemia SSI Add back tube feeding coverage levemir 40 q12  AKI> improved Hypokalemia Replace K Monitor BMET and UOP  HCAP left lower lobe Start cefepime F/u bronchoscopy culture from 7/23 Replace electrolytes as needed  Thyroid goiter causing narrowing of airway F/u with ENT prior to decanulation  Hematuria F/U U/A in 4-6 weeks  Best practice:  Diet: Tube feeding Pain/Anxiety/Delirium protocol (if indicated): as above VAP protocol (if indicated): yes DVT prophylaxis: lovenox GI prophylaxis: ppi Glucose control: as above Mobility: bed rest Code Status: full Family Communication: Updated his sister [name] by phone on 7/24 Disposition: remain in ICU  Labs   CBC: Recent Labs  Lab 08/08/19 0322 08/09/19 0432 08/10/19 0418 08/11/19 0503 08/12/19 0534  WBC 7.0 8.3 8.3 10.6* 13.2*  HGB 9.6* 9.5* 9.3* 9.0* 9.5*  HCT 33.6* 32.8* 32.3* 30.9* 32.0*  MCV 96.0 95.1 94.4 94.5 93.8  PLT 209 244 222 239 284    Basic Metabolic Panel: Recent Labs  Lab 08/05/19 1730 08/06/19 0542 08/08/19 0322 08/09/19 0432 08/10/19 0418 08/11/19 0506 08/12/19 0534  NA  --    < > 149* 148* 146* 141 139  K  --    < > 3.1* 3.4* 3.8 3.8 3.2*  CL  --    < > 106 106 105 101 98  CO2  --    < > 35* 33* 30 32 30  GLUCOSE  --    < > 120* 204* 152* 214* 147*  BUN  --    < > 57* 53* 43* 42* 38*  CREATININE  --    < > 1.11 1.24 1.15 1.17 1.15  CALCIUM  --    < > 8.5* 8.7* 8.8* 8.8* 8.9  MG 2.3  --   --   --   --   --   --   PHOS 3.1  --   --   --    --   --   --    < > = values in this interval not displayed.   GFR: Estimated Creatinine Clearance: 107.7 mL/min (by C-G formula based on SCr of 1.15 mg/dL). Recent Labs  Lab 08/09/19 0432 08/10/19 0418 08/11/19 0503 08/12/19 0534  WBC 8.3 8.3 10.6* 13.2*    Liver Function Tests: No results for input(s): AST, ALT, ALKPHOS, BILITOT, PROT, ALBUMIN in the last 168 hours. No results for input(s): LIPASE, AMYLASE in the last 168 hours. No results for input(s): AMMONIA in the last 168 hours.  ABG    Component Value Date/Time   PHART 7.340 (L) 07/31/2019 0516   PCO2ART 57.1 (H) 07/31/2019 0516   PO2ART 172 (H) 07/31/2019 0516   HCO3 30.6 (H) 07/31/2019 0516   TCO2 32 07/31/2019 0516   ACIDBASEDEF 1.0 07/30/2019 1724   O2SAT 99.0 07/31/2019 0516     Coagulation Profile: No results for input(s): INR, PROTIME in the last  168 hours.  Cardiac Enzymes: No results for input(s): CKTOTAL, CKMB, CKMBINDEX, TROPONINI in the last 168 hours.  HbA1C: Hgb A1c MFr Bld  Date/Time Value Ref Range Status  08/03/2019 01:22 PM 14.9 (H) 4.8 - 5.6 % Final    Comment:    (NOTE) Pre diabetes:          5.7%-6.4%  Diabetes:              >6.4%  Glycemic control for   <7.0% adults with diabetes   03/04/2019 07:08 AM 16.6 (H) 4.8 - 5.6 % Final    Comment:    (NOTE) Pre diabetes:          5.7%-6.4% Diabetes:              >6.4% Glycemic control for   <7.0% adults with diabetes     CBG: Recent Labs  Lab 08/11/19 1151 08/11/19 1644 08/11/19 1926 08/11/19 2358 08/12/19 0352  GLUCAP 152* 165* 172* 146* 154*       Critical care time: 35 minutes     Roselie Awkward, MD [employer] [facility] Pager: [phone] Cell: [phone] If no response, call [phone]

## 2019-08-12 NOTE — Progress Notes (Signed)
eLink Physician-Brief Progress Note Patient Name: Timothy Le DOB: 11/09/1960 MRN: [id number]   Date of Service  08/12/2019  HPI/Events of Note  Oliguria - Bladder scan with 850 mL residual.   eICU Interventions  Plan: I/O cath PRN.         Aarvi Stotts Cornelia Copa 08/12/2019, 9:18 PM

## 2019-08-12 NOTE — H&P (View-Only) (Signed)
Patient ID: Timothy Le, male   DOB: 1960/07/03, 59 y.o.   MRN: [id number]   Asked to reconsult on this patient.  Morbidly obese with respiratory failure requiring intubation and then ultimately extubated followed by emergency tracheostomy due to airway compromise.  Tracheostomy dislodged during the night and he was orally intubated again.  Currently stable.  Known goiter seen on CT of the chest.  On exam, he is intubated and sedated.  He is morbidly obese.  The tracheostomy site has a dressing on it.  The site itself is clear without any signs of infection.  There is still an open wound.  Neck is very short and there is a fullness bilaterally along the thyroid lobes.  Agree with need for repeat tracheostomy with extra long trach, and potential debridement of the thyroid isthmus to facilitate placement and exposure.  We will try to get this scheduled early in the week.

## 2019-08-12 NOTE — Progress Notes (Signed)
Patient ID: Timothy Le, male   DOB: 11/25/1960, 59 y.o.   MRN: [id number]   Asked to reconsult on this patient.  Morbidly obese with respiratory failure requiring intubation and then ultimately extubated followed by emergency tracheostomy due to airway compromise.  Tracheostomy dislodged during the night and he was orally intubated again.  Currently stable.  Known goiter seen on CT of the chest.  On exam, he is intubated and sedated.  He is morbidly obese.  The tracheostomy site has a dressing on it.  The site itself is clear without any signs of infection.  There is still an open wound.  Neck is very short and there is a fullness bilaterally along the thyroid lobes.  Agree with need for repeat tracheostomy with extra long trach, and potential debridement of the thyroid isthmus to facilitate placement and exposure.  We will try to get this scheduled early in the week.

## 2019-08-13 ENCOUNTER — Encounter (HOSPITAL_COMMUNITY): Payer: Self-pay | Admitting: Internal Medicine

## 2019-08-13 ENCOUNTER — Inpatient Hospital Stay (HOSPITAL_COMMUNITY): Payer: Medicaid Other

## 2019-08-13 LAB — COMPREHENSIVE METABOLIC PANEL
ALT: 46 U/L — ABNORMAL HIGH (ref 0–44)
AST: 66 U/L — ABNORMAL HIGH (ref 15–41)
Albumin: 1.7 g/dL — ABNORMAL LOW (ref 3.5–5.0)
Alkaline Phosphatase: 116 U/L (ref 38–126)
Anion gap: 8 (ref 5–15)
BUN: 39 mg/dL — ABNORMAL HIGH (ref 6–20)
CO2: 30 mmol/L (ref 22–32)
Calcium: 8.5 mg/dL — ABNORMAL LOW (ref 8.9–10.3)
Chloride: 100 mmol/L (ref 98–111)
Creatinine, Ser: 1.12 mg/dL (ref 0.61–1.24)
GFR calc Af Amer: >60 mL/min (ref >60)
GFR calc non Af Amer: >60 mL/min (ref >60)
Glucose, Bld: 141 mg/dL — ABNORMAL HIGH (ref 70–99)
Potassium: 3.5 mmol/L (ref 3.5–5.1)
Sodium: 138 mmol/L (ref 135–145)
Total Bilirubin: 0.7 mg/dL (ref 0.3–1.2)
Total Protein: 7.6 g/dL (ref 6.5–8.1)

## 2019-08-13 LAB — CBC WITH DIFFERENTIAL/PLATELET
Abs Immature Granulocytes: 0.47 10*3/uL — ABNORMAL HIGH (ref 0.00–0.07)
Basophils Absolute: 0 10*3/uL (ref 0.0–0.1)
Basophils Relative: 0 %
Eosinophils Absolute: 0.3 10*3/uL (ref 0.0–0.5)
Eosinophils Relative: 3 %
HCT: 28.9 % — ABNORMAL LOW (ref 39.0–52.0)
Hemoglobin: 8.5 g/dL — ABNORMAL LOW (ref 13.0–17.0)
Immature Granulocytes: 5 %
Lymphocytes Relative: 20 %
Lymphs Abs: 1.9 10*3/uL (ref 0.7–4.0)
MCH: 27.7 pg (ref 26.0–34.0)
MCHC: 29.4 g/dL — ABNORMAL LOW (ref 30.0–36.0)
MCV: 94.1 fL (ref 80.0–100.0)
Monocytes Absolute: 0.6 10*3/uL (ref 0.1–1.0)
Monocytes Relative: 7 %
Neutro Abs: 6.3 10*3/uL (ref 1.7–7.7)
Neutrophils Relative %: 65 %
Platelets: 209 10*3/uL (ref 150–400)
RBC: 3.07 MIL/uL — ABNORMAL LOW (ref 4.22–5.81)
RDW: 15.4 % (ref 11.5–15.5)
WBC: 9.5 10*3/uL (ref 4.0–10.5)
nRBC: 1.4 % — ABNORMAL HIGH (ref 0.0–0.2)

## 2019-08-13 LAB — GLUCOSE, CAPILLARY
Glucose-Capillary: 158 mg/dL — ABNORMAL HIGH (ref 70–99)
Glucose-Capillary: 122 mg/dL — ABNORMAL HIGH (ref 70–99)
Glucose-Capillary: 125 mg/dL — ABNORMAL HIGH (ref 70–99)
Glucose-Capillary: 112 mg/dL — ABNORMAL HIGH (ref 70–99)
Glucose-Capillary: 175 mg/dL — ABNORMAL HIGH (ref 70–99)
Glucose-Capillary: 174 mg/dL — ABNORMAL HIGH (ref 70–99)

## 2019-08-13 MED ORDER — FREE WATER
200.0000 mL | Status: DC
  Administered 2019-08-13 – 2019-08-22 (×52): 200 mL

## 2019-08-13 MED ORDER — MIDAZOLAM HCL 2 MG/2ML IJ SOLN
2.0000 mg | INTRAMUSCULAR | Status: DC | PRN
  Administered 2019-08-13 – 2019-08-24 (×31): 2 mg via INTRAVENOUS
  Filled 2019-08-13 (×34): qty 2

## 2019-08-13 MED ORDER — SODIUM CHLORIDE 0.9 % IV SOLN
2.0000 g | INTRAVENOUS | Status: DC
  Administered 2019-08-13 – 2019-08-15 (×3): 2 g via INTRAVENOUS
  Filled 2019-08-13 (×3): qty 20

## 2019-08-13 MED ORDER — POTASSIUM CHLORIDE 20 MEQ/15ML (10%) PO SOLN
40.0000 meq | Freq: Once | ORAL | Status: AC
  Administered 2019-08-13: 40 meq
  Filled 2019-08-13: qty 30

## 2019-08-13 MED ORDER — DOXAZOSIN MESYLATE 4 MG PO TABS
4.0000 mg | ORAL_TABLET | Freq: Every day | ORAL | Status: DC
  Administered 2019-08-13 – 2019-09-05 (×24): 4 mg
  Filled 2019-08-13 (×24): qty 1

## 2019-08-13 NOTE — Progress Notes (Signed)
NAME:  Timothy Le, MRN:  [id number], DOB:  [birth year], LOS: 36 ADMISSION DATE:  07/30/2019, CONSULTATION DATE:  7/11 REFERRING MD:  Kathrynn Humble, CHIEF COMPLAINT:  Dyspnea   Brief History   59 year old male with baseline chronic respiratory failure on the basis of severe OHS/OSA complicated further by significant obstructive disease in the setting of COPD. Presents with acute on chronic hypoxic and hypercarbic respiratory failure. Admitted with the initial working diagnosis of pulmonary edema and decompensated diastolic heart failure. Per family patient was without CPAP for 2 weeks PTA as his machine broke. Has required mechanical ventilation for most of the hospitalization, extubated on 7/16, had stridor, unsuccessful re-intubation, had emergent tracheostomy which was difficult.  On 7/24 he became agitated and his tracheostomy dislodged, required emergent re-intubation.  Past Medical History  Past medical history of Asthma, Chronic respiratory failure with hypoxia ([place]) (07/09/2017), COPD (chronic obstructive pulmonary disease) ([place]), Diabetes mellitus without complication ([place]), Difficult intubation, Hypertension, Shortness of breath dyspnea, and Sleep apnea  Significant Hospital Events   On the night of 7/15-7/16 the patient had one event of desaturation to 70s, resolved with suctioning by RT and RN. Na increased from 147-152, free water flushes increased to 60 mL/hr. Febrile this morning to 100.7*F at 0830. Peep decreased to 8, saturations stable at 100%, FiO2 40%.  On 7/16, the patient was extubated but subsequently became stridorous. Attempted re-intubation was unsuccessful, even via FOB such that the patient had an #6 Shiley emergent trach placed by Trauma Surg. This was followed by atelectasis of the right lung, from which a blood clot was removed via FOB.  7/23 bronch mucus plug  7/24 period of lucidity around 0530, then severely agitated, tracheostomy dislodged, emergently  re-intubated  Consults:  Trauma Surg ENT  Procedures:  Endotracheal tube 7/11 Right IJ triple-lumen catheter 7/11 Trach 7/16 > 7/24 ETT 7/24 >   Significant Diagnostic Tests:  Echocardiogram 7/11 >> no evidence of RV strain; LVEF 50-55%.Left ventricular diastolic function could not be evaluated.  Lower extremity ultrasound 7/11 >> no evidence of lower extremity DVT MRI brain 7/20 > normal MRI brain, extensive sinus mucosal disease, bilateral mastoid effusion, bilateral proptosis  Micro Data:  7/11 SARS COV 2> negative 7/13 resp culture > OPF 7/23 BAL LLL > staph epi   Antimicrobials:  Pip/tazo - 7/16>7/21  Cefepime 7/24 > 7/25  Ceftriaxone 7/24 >   Interim history/subjective:   Intubated yesterday Quiet night Urinary outlet obstruction overnight  Objective   Blood pressure (!) 136/67, pulse 81, temperature 99 F (37.2 C), temperature source Oral, resp. rate (!) 24, height 6' (1.829 m), weight (!) 152.1 kg, SpO2 92 %.    Vent Mode: PRVC FiO2 (%):  [60 %-100 %] 60 % Set Rate:  [15 bmp] 15 bmp Vt Set:  [470 mL] 470 mL PEEP:  [8 cmH20] 8 cmH20 Plateau Pressure:  [13 cmH20-18 cmH20] 18 cmH20   Intake/Output Summary (Last 24 hours) at 08/13/2019 0747 Last data filed at 08/13/2019 0600 Gross per 24 hour  Intake 4136.18 ml  Output 1500 ml  Net 2636.18 ml   Filed Weights   08/10/19 0414 08/11/19 0500 08/12/19 0500  Weight: (!) 149.1 kg (!) 150.8 kg (!) 152.1 kg    Examination:  General:  In bed on vent HENT: NCAT ETT in place old tracheostomy site well dressed PULM: CTA B, vent supported breathing CV: RRR, no mgr GI: BS+, soft, nontender MSK: normal bulk and tone Neuro: sedated on vent   CXR reviewed> left  lower lobe infiltrate, ett in place, PICC/R IJ in place, nasal gastric tube in place  Resolved Hospital Problem list     Assessment & Plan:  Acute on chronic respiratory failure with hypoxemia and hypercarbia in setting of severe OHS and  COPD Prolonged mechanical ventilation Tracheostomy placed 7/16, difficult; dislodged on 7/24 Goiter compressing trachea Maintain oral airway over weekend ENT to revise tracheostomy over weekend Keep sedated until tracheostomy Full mechanical vent support VAP prevention Daily WUA/SBT  Acute metabolic encephalopathy> had period of lucidity this morning Keep heavily sedated over weekend until he can get tracheostomy again PAD protocol, RASS target -2 to -3 Continue dilaudid, precedex infusion per PAD protocol Prn versed  DM2 with hyperglycemia SSI Tube feeding coverage levemir 40 q 12  AKI> improved Hypokalemia Monitor BMET and UOP Replace electrolytes as needed  HCAP left lower lobe: staph epi Stop cefepime Start ceftriaxone (hold vanc as WBC down etc) F/u susceptibility  Hematuria F/U U/A in 4-6 weeks   Best practice:  Diet: Tube feeding Pain/Anxiety/Delirium protocol (if indicated): as above VAP protocol (if indicated): yes DVT prophylaxis: lovenox GI prophylaxis: ppi Glucose control: as above Mobility: bed rest Code Status: full Family Communication: Updated his sister Timothy Le by phone on 7/24 Disposition: remain in ICU  Labs   CBC: Recent Labs  Lab 08/09/19 0432 08/10/19 0418 08/11/19 0503 08/12/19 0534 08/13/19 0429  WBC 8.3 8.3 10.6* 13.2* 9.5  NEUTROABS  --   --   --   --  6.3  HGB 9.5* 9.3* 9.0* 9.5* 8.5*  HCT 32.8* 32.3* 30.9* 32.0* 28.9*  MCV 95.1 94.4 94.5 93.8 94.1  PLT 244 222 239 270 825    Basic Metabolic Panel: Recent Labs  Lab 08/09/19 0432 08/10/19 0418 08/11/19 0506 08/12/19 0534 08/13/19 0429  NA 148* 146* 141 139 138  K 3.4* 3.8 3.8 3.2* 3.5  CL 106 105 101 98 100  CO2 33* 30 32 30 30  GLUCOSE 204* 152* 214* 147* 141*  BUN 53* 43* 42* 38* 39*  CREATININE 1.24 1.15 1.17 1.15 1.12  CALCIUM 8.7* 8.8* 8.8* 8.9 8.5*   GFR: Estimated Creatinine Clearance: 110.5 mL/min (by C-G formula based on SCr of 1.12 mg/dL). Recent  Labs  Lab 08/10/19 0418 08/11/19 0503 08/12/19 0534 08/13/19 0429  WBC 8.3 10.6* 13.2* 9.5    Liver Function Tests: Recent Labs  Lab 08/13/19 0429  AST 66*  ALT 46*  ALKPHOS 116  BILITOT 0.7  PROT 7.6  ALBUMIN 1.7*   No results for input(s): LIPASE, AMYLASE in the last 168 hours. No results for input(s): AMMONIA in the last 168 hours.  ABG    Component Value Date/Time   PHART 7.340 (L) 07/31/2019 0516   PCO2ART 57.1 (H) 07/31/2019 0516   PO2ART 172 (H) 07/31/2019 0516   HCO3 30.6 (H) 07/31/2019 0516   TCO2 32 07/31/2019 0516   ACIDBASEDEF 1.0 07/30/2019 1724   O2SAT 99.0 07/31/2019 0516     Coagulation Profile: No results for input(s): INR, PROTIME in the last 168 hours.  Cardiac Enzymes: No results for input(s): CKTOTAL, CKMB, CKMBINDEX, TROPONINI in the last 168 hours.  HbA1C: Hgb A1c MFr Bld  Date/Time Value Ref Range Status  08/03/2019 01:22 PM 14.9 (H) 4.8 - 5.6 % Final    Comment:    (NOTE) Pre diabetes:          5.7%-6.4%  Diabetes:              >6.4%  Glycemic control  for   <7.0% adults with diabetes   03/04/2019 07:08 AM 16.6 (H) 4.8 - 5.6 % Final    Comment:    (NOTE) Pre diabetes:          5.7%-6.4% Diabetes:              >6.4% Glycemic control for   <7.0% adults with diabetes     CBG: Recent Labs  Lab 08/12/19 1137 08/12/19 1637 08/12/19 1950 08/12/19 2324 08/13/19 0342  GLUCAP 178* 150* 130* 158* 158*       Critical care time: 35 minutes     Roselie Awkward, MD [place] [facility] Pager: [phone] Cell: [phone] If no response, call [phone]

## 2019-08-13 NOTE — Plan of Care (Signed)
  Problem: Clinical Measurements: Goal: Respiratory complications will improve Outcome: Progressing Goal: Cardiovascular complication will be avoided Outcome: Progressing   Problem: Nutrition: Goal: Adequate nutrition will be maintained Outcome: Progressing Note: Pt is tolerating tube feeds at goal.   Problem: Elimination: Goal: Will not experience complications related to bowel motility Outcome: Progressing   Problem: Pain Managment: Goal: General experience of comfort will improve Outcome: Progressing   Problem: Safety: Goal: Ability to remain free from injury will improve Outcome: Progressing   Problem: Activity: Goal: Risk for activity intolerance will decrease Outcome: Not Progressing Note: Unable to mobilize patient at this time. Increased sedation due to agitation and difficult airway.   Problem: Elimination: Goal: Will not experience complications related to urinary retention Outcome: Not Progressing Note: Pt has had multiple episodes of urinary retention within past 24 hours. Last I/O cath was at 0600 this morning. Per Dr. Lake Bells, bladder scan again at 1200 and if still retaining, place indwelling foley catheter.

## 2019-08-13 NOTE — Progress Notes (Signed)
eLink Physician-Brief Progress Note Patient Name: JANCE SIEK DOB: 04/23/1960 MRN: [id number]   Date of Service  08/13/2019  HPI/Events of Note  Called to RT to change ventilator orders to setting that the patient is currently on.   eICU Interventions  Plan: 1. Ventilator orders: 60%/PRVC 15/TV 470/P 8.      Intervention Category Major Interventions: Respiratory failure - evaluation and management  Taquita Demby Eugene 08/13/2019, 8:29 PM

## 2019-08-14 ENCOUNTER — Inpatient Hospital Stay (HOSPITAL_COMMUNITY): Payer: Medicaid Other | Admitting: Anesthesiology

## 2019-08-14 ENCOUNTER — Encounter (HOSPITAL_COMMUNITY)
Admission: EM | Disposition: A | Discharge status: Rehabilitation Facility | Payer: Self-pay | Source: Home / Self Care | Attending: Pulmonary Disease

## 2019-08-14 HISTORY — PX: TRACHEOSTOMY TUBE PLACEMENT: SHX814

## 2019-08-14 LAB — BASIC METABOLIC PANEL
Anion gap: 5 (ref 5–15)
BUN: 44 mg/dL — ABNORMAL HIGH (ref 6–20)
CO2: 32 mmol/L (ref 22–32)
Calcium: 8.1 mg/dL — ABNORMAL LOW (ref 8.9–10.3)
Chloride: 102 mmol/L (ref 98–111)
Creatinine, Ser: 1.12 mg/dL (ref 0.61–1.24)
GFR calc Af Amer: >60 mL/min (ref >60)
GFR calc non Af Amer: >60 mL/min (ref >60)
Glucose, Bld: 96 mg/dL (ref 70–99)
Potassium: 3.4 mmol/L — ABNORMAL LOW (ref 3.5–5.1)
Sodium: 139 mmol/L (ref 135–145)

## 2019-08-14 LAB — GLUCOSE, CAPILLARY
Glucose-Capillary: 111 mg/dL — ABNORMAL HIGH (ref 70–99)
Glucose-Capillary: 130 mg/dL — ABNORMAL HIGH (ref 70–99)
Glucose-Capillary: 136 mg/dL — ABNORMAL HIGH (ref 70–99)
Glucose-Capillary: 151 mg/dL — ABNORMAL HIGH (ref 70–99)
Glucose-Capillary: 175 mg/dL — ABNORMAL HIGH (ref 70–99)
Glucose-Capillary: 86 mg/dL (ref 70–99)

## 2019-08-14 LAB — CBC WITH DIFFERENTIAL/PLATELET
Abs Immature Granulocytes: 0.59 10*3/uL — ABNORMAL HIGH (ref 0.00–0.07)
Basophils Absolute: 0.1 10*3/uL (ref 0.0–0.1)
Basophils Relative: 1 %
Eosinophils Absolute: 0.2 10*3/uL (ref 0.0–0.5)
Eosinophils Relative: 2 %
HCT: 28.2 % — ABNORMAL LOW (ref 39.0–52.0)
Hemoglobin: 8.4 g/dL — ABNORMAL LOW (ref 13.0–17.0)
Immature Granulocytes: 6 %
Lymphocytes Relative: 27 %
Lymphs Abs: 2.7 10*3/uL (ref 0.7–4.0)
MCH: 28.4 pg (ref 26.0–34.0)
MCHC: 29.8 g/dL — ABNORMAL LOW (ref 30.0–36.0)
MCV: 95.3 fL (ref 80.0–100.0)
Monocytes Absolute: 0.7 10*3/uL (ref 0.1–1.0)
Monocytes Relative: 7 %
Neutro Abs: 5.7 10*3/uL (ref 1.7–7.7)
Neutrophils Relative %: 57 %
Platelets: 199 10*3/uL (ref 150–400)
RBC: 2.96 MIL/uL — ABNORMAL LOW (ref 4.22–5.81)
RDW: 15.4 % (ref 11.5–15.5)
WBC: 9.9 10*3/uL (ref 4.0–10.5)
nRBC: 1.5 % — ABNORMAL HIGH (ref 0.0–0.2)

## 2019-08-14 SURGERY — CREATION, TRACHEOSTOMY
Anesthesia: General | Site: Neck

## 2019-08-14 MED ORDER — FENTANYL CITRATE (PF) 100 MCG/2ML IJ SOLN
INTRAMUSCULAR | Status: DC | PRN
  Administered 2019-08-14: 100 ug via INTRAVENOUS
  Administered 2019-08-14: 50 ug via INTRAVENOUS
  Administered 2019-08-14: 100 ug via INTRAVENOUS

## 2019-08-14 MED ORDER — POTASSIUM CHLORIDE 20 MEQ/15ML (10%) PO SOLN
40.0000 meq | Freq: Four times a day (QID) | ORAL | Status: AC
  Administered 2019-08-14 (×2): 40 meq
  Filled 2019-08-14 (×2): qty 30

## 2019-08-14 MED ORDER — ROCURONIUM BROMIDE 100 MG/10ML IV SOLN
INTRAVENOUS | Status: DC | PRN
  Administered 2019-08-14: 100 mg via INTRAVENOUS

## 2019-08-14 MED ORDER — DEXTROSE 10 % IV SOLN: INTRAVENOUS | Status: DC

## 2019-08-14 MED ORDER — 0.9 % SODIUM CHLORIDE (POUR BTL) OPTIME
TOPICAL | Status: DC | PRN
Start: 2019-08-14 — End: 2019-08-14
  Administered 2019-08-14: 1000 mL

## 2019-08-14 MED ORDER — MIDAZOLAM HCL 5 MG/5ML IJ SOLN
INTRAMUSCULAR | Status: DC | PRN
  Administered 2019-08-14: 2 mg via INTRAVENOUS

## 2019-08-14 MED ORDER — MIDAZOLAM HCL 2 MG/2ML IJ SOLN
INTRAMUSCULAR | Status: AC
  Filled 2019-08-14: qty 2

## 2019-08-14 MED ORDER — POTASSIUM CHLORIDE 20 MEQ/15ML (10%) PO SOLN: 40.0000 meq | Freq: Four times a day (QID) | ORAL | Status: DC

## 2019-08-14 MED ORDER — FENTANYL CITRATE (PF) 250 MCG/5ML IJ SOLN
INTRAMUSCULAR | Status: AC
  Filled 2019-08-14: qty 5

## 2019-08-14 MED ORDER — HEMOSTATIC AGENTS (NO CHARGE) OPTIME
TOPICAL | Status: DC | PRN
  Administered 2019-08-14: 1 via TOPICAL

## 2019-08-14 MED ORDER — EPHEDRINE SULFATE 50 MG/ML IJ SOLN
INTRAMUSCULAR | Status: DC | PRN
  Administered 2019-08-14: 10 mg via INTRAVENOUS

## 2019-08-14 MED ORDER — PHENYLEPHRINE HCL (PRESSORS) 10 MG/ML IV SOLN
INTRAVENOUS | Status: DC | PRN
Start: 2019-08-14 — End: 2019-08-14
  Administered 2019-08-14: 100 ug via INTRAVENOUS

## 2019-08-14 SURGICAL SUPPLY — 42 items
APL SKNCLS STERI-STRIP NONHPOA (GAUZE/BANDAGES/DRESSINGS)
BENZOIN TINCTURE PRP APPL 2/3 (GAUZE/BANDAGES/DRESSINGS) IMPLANT
BLADE CLIPPER SURG (BLADE) IMPLANT
CABLE BIPOLOR RESECTION CORD (MISCELLANEOUS) ×2 IMPLANT
CANISTER SUCT 3000ML PPV (MISCELLANEOUS) ×3 IMPLANT
CLEANER TIP ELECTROSURG 2X2 (MISCELLANEOUS) ×3 IMPLANT
COVER SURGICAL LIGHT HANDLE (MISCELLANEOUS) ×3 IMPLANT
COVER WAND RF STERILE (DRAPES) ×3 IMPLANT
DECANTER SPIKE VIAL GLASS SM (MISCELLANEOUS) ×3 IMPLANT
DRAPE HALF SHEET 40X57 (DRAPES) ×2 IMPLANT
DRSG DRAWTEX TRACH 4X4 (GAUZE/BANDAGES/DRESSINGS) ×2 IMPLANT
ELECT COATED BLADE 2.86 ST (ELECTRODE) ×3 IMPLANT
ELECT REM PT RETURN 9FT ADLT (ELECTROSURGICAL) ×3
ELECTRODE REM PT RTRN 9FT ADLT (ELECTROSURGICAL) ×1 IMPLANT
FORCEPS BIPOLAR SPETZLER 8 1.0 (NEUROSURGERY SUPPLIES) ×2 IMPLANT
GAUZE 4X4 16PLY RFD (DISPOSABLE) ×5 IMPLANT
GLOVE ECLIPSE 7.5 STRL STRAW (GLOVE) ×3 IMPLANT
GOWN STRL REUS W/ TWL LRG LVL3 (GOWN DISPOSABLE) ×2 IMPLANT
GOWN STRL REUS W/TWL LRG LVL3 (GOWN DISPOSABLE) ×6
HEMOSTAT SURGICEL 2X14 (HEMOSTASIS) ×2 IMPLANT
HOLDER TRACH TUBE VELCRO 19.5 (MISCELLANEOUS) ×2 IMPLANT
KIT BASIN OR (CUSTOM PROCEDURE TRAY) ×3 IMPLANT
KIT TURNOVER KIT B (KITS) ×3 IMPLANT
NDL PRECISIONGLIDE 27X1.5 (NEEDLE) ×1 IMPLANT
NEEDLE PRECISIONGLIDE 27X1.5 (NEEDLE) ×3 IMPLANT
NS IRRIG 1000ML POUR BTL (IV SOLUTION) ×3 IMPLANT
PAD ARMBOARD 7.5X6 YLW CONV (MISCELLANEOUS) ×6 IMPLANT
PENCIL FOOT CONTROL (ELECTRODE) ×3 IMPLANT
SUT CHROMIC 2 0 SH (SUTURE) ×3 IMPLANT
SUT ETHILON 3 0 PS 1 (SUTURE) ×3 IMPLANT
SUT SILK 4 0 (SUTURE) ×3
SUT SILK 4 0 TIE 10X30 (SUTURE) ×3 IMPLANT
SUT SILK 4-0 18XBRD TIE 12 (SUTURE) ×1 IMPLANT
SYR 20ML LL LF (SYRINGE) ×3 IMPLANT
SYR CONTROL 10ML LL (SYRINGE) ×2 IMPLANT
TOWEL GREEN STERILE FF (TOWEL DISPOSABLE) ×3 IMPLANT
TRAY ENT MC OR (CUSTOM PROCEDURE TRAY) ×3 IMPLANT
TUBE CONNECTING 12'X1/4 (SUCTIONS) ×1
TUBE CONNECTING 12X1/4 (SUCTIONS) ×2 IMPLANT
TUBE TRACH ADJ TTS 7MM (TUBING) ×2 IMPLANT
TUBE TRACH MID RANGE 8MM (TUBING) IMPLANT
WATER STERILE IRR 1000ML POUR (IV SOLUTION) ×3 IMPLANT

## 2019-08-14 NOTE — Progress Notes (Addendum)
NAME:  Timothy Le, MRN:  [id number], DOB:  1960/06/26, LOS: 72 ADMISSION DATE:  07/30/2019, CONSULTATION DATE:  7/11 REFERRING MD:  Kathrynn Humble, CHIEF COMPLAINT:  Dyspnea   Brief History   59 year old male with baseline chronic respiratory failure on the basis of severe OHS/OSA complicated further by significant obstructive disease in the setting of COPD. Presents with acute on chronic hypoxic and hypercarbic respiratory failure. Admitted with the initial working diagnosis of pulmonary edema and decompensated diastolic heart failure. Per family patient was without CPAP for 2 weeks PTA as his machine broke. Has required mechanical ventilation for most of the hospitalization, extubated on 7/16, had stridor, unsuccessful re-intubation, had emergent tracheostomy which was difficult.  On 7/24 he became agitated and his tracheostomy dislodged, required emergent re-intubation.  Past Medical History  Past medical history of Asthma, Chronic respiratory failure with hypoxia ([place]) (07/09/2017), COPD (chronic obstructive pulmonary disease) ([place]), Diabetes mellitus without complication ([place]), Difficult intubation, Hypertension, Shortness of breath dyspnea, and Sleep apnea  Significant Hospital Events   On the night of 7/15-7/16 the patient had one event of desaturation to 70s, resolved with suctioning by RT and RN. Na increased from 147-152, free water flushes increased to 60 mL/hr. Febrile this morning to 100.7*F at 0830. Peep decreased to 8, saturations stable at 100%, FiO2 40%.  On 7/16, the patient was extubated but subsequently became stridorous. Attempted re-intubation was unsuccessful, even via FOB such that the patient had an #6 Shiley emergent trach placed by Trauma Surg. This was followed by atelectasis of the right lung, from which a blood clot was removed via FOB.  7/23 bronch mucus plug  7/24 period of lucidity around 0530, then severely agitated, tracheostomy dislodged, emergently  re-intubated  Consults:  Trauma Surg ENT  Procedures:  Endotracheal tube 7/11 Right IJ triple-lumen catheter 7/11 Trach 7/16 > 7/24 ETT 7/24 >   Significant Diagnostic Tests:  Echocardiogram 7/11 >> no evidence of RV strain; LVEF 50-55%.Left ventricular diastolic function could not be evaluated.  Lower extremity ultrasound 7/11 >> no evidence of lower extremity DVT MRI brain 7/20 > normal MRI brain, extensive sinus mucosal disease, bilateral mastoid effusion, bilateral proptosis  Micro Data:  7/11 SARS COV 2> negative 7/13 resp culture > OPF 7/23 BAL LLL > staph epi   Antimicrobials:  Pip/tazo - 7/16>7/21  Cefepime 7/24 > 7/25  Ceftriaxone 7/24 >   Interim history/subjective:   Intubated 7/24, no acute events overnight. Going for tracheostomy in OR today.  Objective   Blood pressure (!) 101/62, pulse 46, temperature 99 F (37.2 C), temperature source Oral, resp. rate 17, height 6' (1.829 m), weight (!) 152.1 kg, SpO2 96 %.    Vent Mode: PRVC FiO2 (%):  [60 %-70 %] 70 % Set Rate:  [15 bmp] 15 bmp Vt Set:  [470 mL] 470 mL PEEP:  [8 cmH20] 8 cmH20 Plateau Pressure:  [17 cmH20-23 cmH20] 17 cmH20   Intake/Output Summary (Last 24 hours) at 08/14/2019 5732 Last data filed at 08/14/2019 0900 Gross per 24 hour  Intake 2677.71 ml  Output 3040 ml  Net -362.29 ml   Filed Weights   08/11/19 0500 08/12/19 0500 08/14/19 0408  Weight: (!) 150.8 kg (!) 152.1 kg (!) 152.1 kg    Examination: General:  In bed on vent HENT: NCAT ETT in place old tracheostomy site well dressed PULM: CTAB, vent supported breathing CV: RRR, no mgr GI: BS+, soft, nontender MSK: normal bulk and tone Neuro: sedated on vent  CXR reviewed> left  lower lobe infiltrate, ett in place, PICC/R IJ in place, nasal gastric tube in place  Resolved Hospital Problem list     Assessment & Plan:  Acute on chronic respiratory failure with hypoxemia and hypercarbia in setting of severe OHS and COPD Prolonged  mechanical ventilation Tracheostomy placed 7/16, difficult; dislodged on 7/24 Goiter compressing trachea Maintain oral airway until tracheostomy revision ENT to revise tracheostomy today Keep sedated until tracheostomy Full mechanical vent support VAP prevention Daily WUA/SBT  Acute metabolic encephalopathy> had period of lucidity yesterday Keep heavily sedated over weekend until he can get tracheostomy today PAD protocol, RASS target -2 to -3 Continue dilaudid, precedex infusion per PAD protocol Prn versed  DM2 with hyperglycemia SSI Tube feeds held ON for tracheostomy replacement in OR today levemir 40 q 12- given this AM, D10 given to prevent hypoglycemia- to wean as soon as able  AKI> improved Hypokalemia Monitor BMET and UOP Replace electrolytes as needed  HCAP left lower lobe: staph epi Stop cefepime Start ceftriaxone (hold vanc as WBC down etc) F/u susceptibility, re-incubated for better growth  Hematuria F/U U/A in 4-6 weeks  Best practice:  Diet: Tube feeding Pain/Anxiety/Delirium protocol (if indicated): as above VAP protocol (if indicated): yes DVT prophylaxis: lovenox GI prophylaxis: ppi Glucose control: as above Mobility: bed rest Code Status: full Family Communication: Updated his sister [name] by phone on 7/24 Disposition: remain in ICU  Labs   CBC: Recent Labs  Lab 08/10/19 0418 08/11/19 0503 08/12/19 0534 08/13/19 0429 08/14/19 0404  WBC 8.3 10.6* 13.2* 9.5 9.9  NEUTROABS  --   --   --  6.3 5.7  HGB 9.3* 9.0* 9.5* 8.5* 8.4*  HCT 32.3* 30.9* 32.0* 28.9* 28.2*  MCV 94.4 94.5 93.8 94.1 95.3  PLT 222 239 270 209 283    Basic Metabolic Panel: Recent Labs  Lab 08/10/19 0418 08/11/19 0506 08/12/19 0534 08/13/19 0429 08/14/19 0404  NA 146* 141 139 138 139  K 3.8 3.8 3.2* 3.5 3.4*  CL 105 101 98 100 102  CO2 30 32 30 30 32  GLUCOSE 152* 214* 147* 141* 96  BUN 43* 42* 38* 39* 44*  CREATININE 1.15 1.17 1.15 1.12 1.12  CALCIUM 8.8*  8.8* 8.9 8.5* 8.1*   GFR: Estimated Creatinine Clearance: 110.5 mL/min (by C-G formula based on SCr of 1.12 mg/dL). Recent Labs  Lab 08/11/19 0503 08/12/19 0534 08/13/19 0429 08/14/19 0404  WBC 10.6* 13.2* 9.5 9.9    Liver Function Tests: Recent Labs  Lab 08/13/19 0429  AST 66*  ALT 46*  ALKPHOS 116  BILITOT 0.7  PROT 7.6  ALBUMIN 1.7*   No results for input(s): LIPASE, AMYLASE in the last 168 hours. No results for input(s): AMMONIA in the last 168 hours.  ABG    Component Value Date/Time   PHART 7.340 (L) 07/31/2019 0516   PCO2ART 57.1 (H) 07/31/2019 0516   PO2ART 172 (H) 07/31/2019 0516   HCO3 30.6 (H) 07/31/2019 0516   TCO2 32 07/31/2019 0516   ACIDBASEDEF 1.0 07/30/2019 1724   O2SAT 99.0 07/31/2019 0516     Coagulation Profile: No results for input(s): INR, PROTIME in the last 168 hours.  Cardiac Enzymes: No results for input(s): CKTOTAL, CKMB, CKMBINDEX, TROPONINI in the last 168 hours.  HbA1C: Hgb A1c MFr Bld  Date/Time Value Ref Range Status  08/03/2019 01:22 PM 14.9 (H) 4.8 - 5.6 % Final    Comment:    (NOTE) Pre diabetes:  5.7%-6.4%  Diabetes:              >6.4%  Glycemic control for   <7.0% adults with diabetes   03/04/2019 07:08 AM 16.6 (H) 4.8 - 5.6 % Final    Comment:    (NOTE) Pre diabetes:          5.7%-6.4% Diabetes:              >6.4% Glycemic control for   <7.0% adults with diabetes     CBG: Recent Labs  Lab 08/13/19 1627 08/13/19 2013 08/13/19 2323 08/14/19 0330 08/14/19 0739  GLUCAP 112* 175* 174* 86 111*   Gladys Damme, MD [facility], PGY-2   PCCM:   59 yo OHS OSA, difficult airway, was extubated failed, difficulty with re-intubation, urgent tracheostomy, this trach was dislodged in the middle of the night, was re-intubated successfully oro-tracheally. Plans for OR today for surgical trach by ENT.   Mental status remains poor.   BP 117/72   Pulse 46   Temp 99 F (37.2  C) (Oral)   Resp 17   Ht 6' (1.829 m)   Wt (!) 152.1 kg   SpO2 96%   BMI 45.48 kg/m   Gen: obese male, on life support  Heart: RRR s1 s2 Lungs: BL vented breaths  Abd: mild destension   Labs reviewed   A:  Chronic hypoxemic respiratory failure, going for trach placement  Goiter Acute metabolic encephalopathy  DMII with hyperglycemia  AKI   P:  Trach today in OR  Keep sedate until then  Adult full mechanical vent support  We will plan to start weaning once back from OR  Also needs to restart tube feeding once back from or and stiop d10  This patient is critically ill with multiple organ system failure; which, requires frequent high complexity decision making, assessment, support, evaluation, and titration of therapies. This was completed through the application of advanced monitoring technologies and extensive interpretation of multiple databases. During this encounter critical care time was devoted to patient care services described in this note for 31 minutes.  [facility] 08/14/2019 3:18 PM

## 2019-08-14 NOTE — Op Note (Signed)
08/14/2019 10:50 AM   PATIENT:  Timothy Le, 59 y.o. male  PRE-OPERATIVE DIAGNOSIS:  Respiratory Failure  POST-OPERATIVE DIAGNOSIS:  Respiratory Failure   PROCEDURE:  Procedure(s): TRACHEOSTOMY  SURGEON:  Surgeon(s): Beckie Salts, MD  ASSISTANTS: none   ANESTHESIA:   general  EBL: 40 cc  DRAINS: none   LOCAL MEDICATIONS USED:  NONE  COUNTS CORRECT:  YES  PROCEDURE DETAILS: Patient was taken to the operating room and placed on the operating table in the supine position. A shoulder roll was placed for positioning. The patient was previously orally intubated. The neck was prepped and draped in a standard fashion.  The previous tracheostomy site was still open.  Electrocautery was used to incise the skin around the edges of the wound.  Dissection continued down through soft tissue and granulation tissue.  Bleeding was controlled using electrocautery and bipolar cautery.  A trach spreader was used to identify the tracheotomy defect.  The endotracheal tube was visualized.  Under direct visualization while the endotracheal tube was carefully pulled back until the tip was just at the superior aspect of the tracheotomy site, a #8 adjustable length tracheostomy was attempted to be inserted but was not able to be.  #7 was then placed without difficulty.  Surgicel was then packed in the inferior aspect of the wound.  The trach shield was secured in place with Velcro straps.  The patient was then transferred back to the intensive care unit in critical condition.  During the procedure during brief apneic episodes patient desaturated very quickly.  This quickly returned to acceptable levels with resumption of ventilation.  PLAN OF CARE: Transfer to ICU  PATIENT DISPOSITION:  ICU - hemodynamically stable.

## 2019-08-14 NOTE — Interval H&P Note (Signed)
History and Physical Interval Note:  08/14/2019 8:51 AM  Timothy Le  has presented today for surgery, with the diagnosis of Respiratory Failure.  The various methods of treatment have been discussed with the patient and family. After consideration of risks, benefits and other options for treatment, the patient has consented to  Procedure(s): TRACHEOSTOMY (N/A) as a surgical intervention.  The patient's history has been reviewed, patient examined, no change in status, stable for surgery.  I have reviewed the patient's chart and labs.  Questions were answered to the patient's satisfaction.     Izora Gala

## 2019-08-14 NOTE — Transfer of Care (Signed)
Immediate Anesthesia Transfer of Care Note  Patient: Timothy Le  Procedure(s) Performed: TRACHEOSTOMY (N/A Neck)  Patient Location: ICU  Anesthesia Type:General  Level of Consciousness: sedated and unresponsive  Airway & Oxygen Therapy: Patient Spontanous Breathing and Patient remains intubated per anesthesia plan  Post-op Assessment: Report given to RN and Post -op Vital signs reviewed and stable  Post vital signs: Reviewed and stable  Last Vitals:  Vitals Value Taken Time  BP    Temp    Pulse    Resp    SpO2      Last Pain:  Vitals:   08/14/19 0747  TempSrc: Oral  PainSc:          Complications: No complications documented.

## 2019-08-14 NOTE — Anesthesia Preprocedure Evaluation (Signed)
Anesthesia Evaluation  Patient identified by MRN, date of birth, ID band Patient unresponsive    Reviewed: Patient's Chart, lab work & pertinent test results  Airway Mallampati: Intubated       Dental   Pulmonary former smoker,    + rhonchi        Cardiovascular hypertension,  Rhythm:Regular Rate:Normal     Neuro/Psych    GI/Hepatic   Endo/Other  diabetes  Renal/GU      Musculoskeletal   Abdominal (+) + obese,   Peds  Hematology   Anesthesia Other Findings   Reproductive/Obstetrics                             Anesthesia Physical Anesthesia Plan  ASA: IV  Anesthesia Plan: General   Post-op Pain Management:    Induction: Intravenous  PONV Risk Score and Plan: Ondansetron  Airway Management Planned: Oral ETT and Tracheostomy  Additional Equipment:   Intra-op Plan:   Post-operative Plan: Post-operative intubation/ventilation  Informed Consent: I have reviewed the patients History and Physical, chart, labs and discussed the procedure including the risks, benefits and alternatives for the proposed anesthesia with the patient or authorized representative who has indicated his/her understanding and acceptance.       Plan Discussed with: CRNA and Anesthesiologist  Anesthesia Plan Comments:         Anesthesia Quick Evaluation

## 2019-08-14 NOTE — Progress Notes (Signed)
RT note- Patient transported back to 3M09 on ventilator.

## 2019-08-14 NOTE — Anesthesia Procedure Notes (Signed)
Date/Time: 08/14/2019 9:37 AM Performed by: Eligha Bridegroom, CRNA Pre-anesthesia Checklist: Patient identified, Emergency Drugs available, Suction available, Patient being monitored and Timeout performed Patient Re-evaluated:Patient Re-evaluated prior to induction Oxygen Delivery Method: Circle system utilized Induction Type: IV induction

## 2019-08-14 NOTE — Anesthesia Postprocedure Evaluation (Signed)
Anesthesia Post Note  Patient: Timothy Le  Procedure(s) Performed: TRACHEOSTOMY (N/A Neck)     Patient location during evaluation: ICU Anesthesia Type: General Level of consciousness: sedated and patient remains intubated per anesthesia plan Pain management: pain level controlled Vital Signs Assessment: post-procedure vital signs reviewed and stable Respiratory status: patient remains intubated per anesthesia plan and patient on ventilator - see flowsheet for VS Cardiovascular status: stable Postop Assessment: no apparent nausea or vomiting Anesthetic complications: no   No complications documented.  Last Vitals:  Vitals:   08/14/19 0900 08/14/19 1253  BP: (!) 101/62 117/72  Pulse: 46   Resp: 17   Temp:    SpO2: 96%     Last Pain:  Vitals:   08/14/19 0747  TempSrc: Oral  PainSc:                  Alok Minshall,Moriah COKER

## 2019-08-14 NOTE — Progress Notes (Signed)
Nutrition Follow-up  DOCUMENTATION CODES:   Morbid obesity  INTERVENTION:   Tube Feeding:  Vital High Protein at 70 ml/hr Pro-Source TF 45 mL TID Providing 181 g of protein, 1800 kcals and 1411 mL of free water Meets 100% estimated calorie and protein needs  NUTRITION DIAGNOSIS:   Inadequate oral intake related to inability to eat as evidenced by NPO status.  Being addressed via TF   GOAL:   Patient will meet greater than or equal to 90% of their needs  Progressing   MONITOR:   TF tolerance, Vent status, Weight trends, Labs, I & O's  REASON FOR ASSESSMENT:   Consult Enteral/tube feeding initiation and management  ASSESSMENT:   Pt with baseline chronic respiratory failure on the basis of severe OHS/OSA complicated further by significant obstructive disease in the setting of COPD.  Presents with acute on chronic hypoxic and hypercarbic respiratory failure.  Admitted with the initial working diagnosis of pulmonary edema and decompensated diastolic heart failure. PMH includes HTN, COPD, DM.  7/11 Intubated 7/16 Extubated but developed stridor, attempted re-intubation unsuccessful, emergent Trach placed 7/21 Cortrak placed 7/23 Bronch, mucous plugg 7/24 Trach dislodged, emergently re-intubated 7/26 ENT replaced trach in OR  TF held for procedure today, plan to restart TF Tolerating Vital High Protein at 70 ml/hr, ProSource TF TID and free water 200 mL q 4 hours via Cortrak tube  Current weight 152 kg; admit weight 147 kg. Net +11 L per I/O flow sheet  Noted stage II pressure injury on buttock  Labs: reviewed Meds: ss novolog, novolog q 4 hours, levemir, KCl    Diet Order:   Diet Order            Diet NPO time specified  Diet effective now                 EDUCATION NEEDS:   No education needs have been identified at this time  Skin:  Skin Assessment: Skin Integrity Issues: Skin Integrity Issues:: Stage II Stage II: buttock  Last BM:  7/26 rectal  tube  Height:   Ht Readings from Last 1 Encounters:  07/30/19 6' (1.829 m)    Weight:   Wt Readings from Last 1 Encounters:  08/14/19 (!) 152.1 kg    Ideal Body Weight:  80.9 kg  BMI:  Body mass index is 45.48 kg/m.  Estimated Nutritional Needs:   Kcal:  5993-5701  Protein:  160-200  Fluid:  >1.6L/d  Kerman Passey MS, RDN, LDN, CNSC Registered Dietitian III Clinical Nutrition RD Pager and On-Call Pager Number Located in [place]

## 2019-08-14 NOTE — Progress Notes (Signed)
eLink Physician-Brief Progress Note Patient Name: Timothy Le DOB: [birth year] MRN: [id number]   Date of Service  08/14/2019  HPI/Events of Note  Hypoglycemia - Blood glucose = 86. Tube feeds off for procedure in AM and patient given Levemir at 12:30 AM.  eICU Interventions  Plan: 1. D10W IV infusion to run at 40 mL/hour.      Intervention Category Major Interventions: Other:  Lysle Dingwall 08/14/2019, 3:57 AM

## 2019-08-15 ENCOUNTER — Encounter (HOSPITAL_COMMUNITY): Payer: Self-pay | Admitting: Otolaryngology

## 2019-08-15 DIAGNOSIS — J9621 Acute and chronic respiratory failure with hypoxia: Secondary | ICD-10-CM | POA: Diagnosis not present

## 2019-08-15 DIAGNOSIS — J810 Acute pulmonary edema: Secondary | ICD-10-CM | POA: Diagnosis not present

## 2019-08-15 LAB — BASIC METABOLIC PANEL
Anion gap: 6 (ref 5–15)
BUN: 39 mg/dL — ABNORMAL HIGH (ref 6–20)
CO2: 32 mmol/L (ref 22–32)
Calcium: 8.3 mg/dL — ABNORMAL LOW (ref 8.9–10.3)
Chloride: 103 mmol/L (ref 98–111)
Creatinine, Ser: 1.04 mg/dL (ref 0.61–1.24)
GFR calc Af Amer: >60 mL/min (ref >60)
GFR calc non Af Amer: >60 mL/min (ref >60)
Glucose, Bld: 163 mg/dL — ABNORMAL HIGH (ref 70–99)
Potassium: 3.6 mmol/L (ref 3.5–5.1)
Sodium: 141 mmol/L (ref 135–145)

## 2019-08-15 LAB — GLUCOSE, CAPILLARY
Glucose-Capillary: 177 mg/dL — ABNORMAL HIGH (ref 70–99)
Glucose-Capillary: 121 mg/dL — ABNORMAL HIGH (ref 70–99)
Glucose-Capillary: 153 mg/dL — ABNORMAL HIGH (ref 70–99)
Glucose-Capillary: 142 mg/dL — ABNORMAL HIGH (ref 70–99)
Glucose-Capillary: 130 mg/dL — ABNORMAL HIGH (ref 70–99)
Glucose-Capillary: 124 mg/dL — ABNORMAL HIGH (ref 70–99)

## 2019-08-15 LAB — CULTURE, RESPIRATORY W GRAM STAIN

## 2019-08-15 MED ORDER — FUROSEMIDE 10 MG/ML IJ SOLN
40.0000 mg | Freq: Four times a day (QID) | INTRAMUSCULAR | Status: AC
  Administered 2019-08-15 – 2019-08-16 (×3): 40 mg via INTRAVENOUS
  Filled 2019-08-15 (×3): qty 4

## 2019-08-15 MED ORDER — POTASSIUM CHLORIDE 20 MEQ/15ML (10%) PO SOLN
40.0000 meq | Freq: Once | ORAL | Status: AC
  Administered 2019-08-15: 40 meq via ORAL
  Filled 2019-08-15: qty 30

## 2019-08-15 MED ORDER — MIDAZOLAM 50MG/50ML (1MG/ML) PREMIX INFUSION
0.5000 mg/h | INTRAVENOUS | Status: DC
  Administered 2019-08-15: 4.5 mg/h via INTRAVENOUS
  Administered 2019-08-15: 0.5 mg/h via INTRAVENOUS
  Filled 2019-08-15 (×2): qty 50

## 2019-08-15 NOTE — Progress Notes (Signed)
Wasted 100 mL of Dilaudid with RN Geroge Baseman in the [place].

## 2019-08-15 NOTE — Progress Notes (Addendum)
NAME:  Timothy Le, MRN:  [id number], DOB:  [birth year], LOS: 68 ADMISSION DATE:  07/30/2019, CONSULTATION DATE:  7/11 REFERRING MD:  Timothy Le, CHIEF COMPLAINT:  Dyspnea   Brief History   59 year old male with baseline chronic respiratory failure on the basis of severe OHS/OSA complicated further by significant obstructive disease in the setting of COPD. Presents with acute on chronic hypoxic and hypercarbic respiratory failure. Admitted with the initial working diagnosis of pulmonary edema and decompensated diastolic heart failure. Per family patient was without CPAP for 2 weeks PTA as his machine broke. Has required mechanical ventilation for most of the hospitalization, extubated on 7/16, had stridor, unsuccessful re-intubation, had emergent tracheostomy which was difficult.  On 7/24 he became agitated and his tracheostomy dislodged, required emergent re-intubation.  Past Medical History  Past medical history of Asthma, Chronic respiratory failure with hypoxia ([place]) (07/09/2017), COPD (chronic obstructive pulmonary disease) ([place]), Diabetes mellitus without complication ([place]), Difficult intubation, Hypertension, Shortness of breath dyspnea, and Sleep apnea  Significant Hospital Events   On the night of 7/15-7/16 the patient had one event of desaturation to 70s, resolved with suctioning by RT and RN. Na increased from 147-152, free water flushes increased to 60 mL/hr. Febrile this morning to 100.7*F at 0830. Peep decreased to 8, saturations stable at 100%, FiO2 40%.  On 7/16, the patient was extubated but subsequently became stridorous. Attempted re-intubation was unsuccessful, even via FOB such that the patient had an #6 Shiley emergent trach placed by Trauma Surg. This was followed by atelectasis of the right lung, from which a blood clot was removed via FOB.  7/23 bronch mucus plug  7/24 period of lucidity around 0530, then severely agitated, tracheostomy dislodged, emergently  re-intubated  Consults:  Trauma Surg ENT  Procedures:  Endotracheal tube 7/11 Right IJ triple-lumen catheter 7/11 Trach 7/16 > 7/24 ETT 7/24 > 7/26 Trach 7/26>  Significant Diagnostic Tests:  Echocardiogram 7/11 >> no evidence of RV strain; LVEF 50-55%.Left ventricular diastolic function could not be evaluated.  Lower extremity ultrasound 7/11 >> no evidence of lower extremity DVT MRI brain 7/20 > normal MRI brain, extensive sinus mucosal disease, bilateral mastoid effusion, bilateral proptosis  Micro Data:  7/11 SARS COV 2> negative 7/13 resp culture > OPF 7/23 BAL LLL > staph epi   Antimicrobials:  Pip/tazo - 7/16>7/21  Cefepime 7/24 > 7/25  Ceftriaxone 7/24 >   Interim history/subjective:   Tracheostomy replaced in OR yesterday. No acute events overnight.   Objective   Blood pressure (!) 101/63, pulse 96, temperature 98.2 F (36.8 C), temperature source Axillary, resp. rate 18, height 6' (1.829 m), weight (!) 158.1 kg, SpO2 93 %.    Vent Mode: PRVC FiO2 (%):  [60 %-70 %] 60 % Set Rate:  [15 bmp] 15 bmp Vt Set:  [470 mL] 470 mL PEEP:  [8 cmH20] 8 cmH20 Plateau Pressure:  [12 cmH20-21 cmH20] 12 cmH20   Intake/Output Summary (Last 24 hours) at 08/15/2019 0854 Last data filed at 08/15/2019 0817 Gross per 24 hour  Intake 2759.44 ml  Output 3180 ml  Net -420.56 ml   Filed Weights   08/12/19 0500 08/14/19 0408 08/15/19 0500  Weight: (!) 152.1 kg (!) 152.1 kg (!) 158.1 kg    Examination: General:  In bed on vent, NARD HENT: NCAT, tracheostomy in place PULM: CTAB, vent supported breathing CV: RRR, no mgr GI: BS+, soft, nontender MSK: normal bulk and tone Neuro: eyes open, moving extremities, not following commands  CXR reviewed> left  lower lobe infiltrate, ett in place, PICC/R IJ in place, nasal gastric tube in place  Resolved Hospital Problem list     Assessment & Plan:  Acute on chronic respiratory failure with hypoxemia and hypercarbia in setting of  severe OHS and COPD Prolonged mechanical ventilation Tracheostomy placed 7/16, difficult; dislodged on 7/24 Goiter compressing trachea Full mechanical vent support VAP prevention Daily WUA/SBT Plan to wean vent/sedation as tolerated to assess mental status.  Acute metabolic encephalopathy Had period of lucidity on 7/24 prior to coughing out trach. PAD protocol, RASS target 0 Wean dilaudid, precedex infusion as tolerated Prn versed  DM2 with hyperglycemia SSI Tube feeds restarted Levemir  AKI> improved Hypokalemia Monitor BMET and UOP Replace electrolytes as needed  HCAP left lower lobe: staph epi Discontinue CTX Susceptibilities are multi-drug resistant, unlikely truly from BAL, most likely contaminant Will continue to monitor and if other objective signs of infection (leukocytosis, fever) can treat with doxycycline or linezolid per ID pharmacy  Hematuria F/U U/A in 4-6 weeks  Best practice:  Diet: Tube feeding Pain/Anxiety/Delirium protocol (if indicated): as above VAP protocol (if indicated): yes DVT prophylaxis: lovenox GI prophylaxis: ppi Glucose control: as above Mobility: bed rest Code Status: full Family Communication: Updated his sister Timothy Le by phone on 7/24 Disposition: remain in ICU  Labs   CBC: Recent Labs  Lab 08/10/19 0418 08/11/19 0503 08/12/19 0534 08/13/19 0429 08/14/19 0404  WBC 8.3 10.6* 13.2* 9.5 9.9  NEUTROABS  --   --   --  6.3 5.7  HGB 9.3* 9.0* 9.5* 8.5* 8.4*  HCT 32.3* 30.9* 32.0* 28.9* 28.2*  MCV 94.4 94.5 93.8 94.1 95.3  PLT 222 239 270 209 176    Basic Metabolic Panel: Recent Labs  Lab 08/11/19 0506 08/12/19 0534 08/13/19 0429 08/14/19 0404 08/15/19 0443  NA 141 139 138 139 141  K 3.8 3.2* 3.5 3.4* 3.6  CL 101 98 100 102 103  CO2 32 30 30 32 32  GLUCOSE 214* 147* 141* 96 163*  BUN 42* 38* 39* 44* 39*  CREATININE 1.17 1.15 1.12 1.12 1.04  CALCIUM 8.8* 8.9 8.5* 8.1* 8.3*   GFR: Estimated Creatinine Clearance:  121.7 mL/min (by C-G formula based on SCr of 1.04 mg/dL). Recent Labs  Lab 08/11/19 0503 08/12/19 0534 08/13/19 0429 08/14/19 0404  WBC 10.6* 13.2* 9.5 9.9    Liver Function Tests: Recent Labs  Lab 08/13/19 0429  AST 66*  ALT 46*  ALKPHOS 116  BILITOT 0.7  PROT 7.6  ALBUMIN 1.7*   No results for input(s): LIPASE, AMYLASE in the last 168 hours. No results for input(s): AMMONIA in the last 168 hours.  ABG    Component Value Date/Time   PHART 7.340 (L) 07/31/2019 0516   PCO2ART 57.1 (H) 07/31/2019 0516   PO2ART 172 (H) 07/31/2019 0516   HCO3 30.6 (H) 07/31/2019 0516   TCO2 32 07/31/2019 0516   ACIDBASEDEF 1.0 07/30/2019 1724   O2SAT 99.0 07/31/2019 0516     Coagulation Profile: No results for input(s): INR, PROTIME in the last 168 hours.  Cardiac Enzymes: No results for input(s): CKTOTAL, CKMB, CKMBINDEX, TROPONINI in the last 168 hours.  HbA1C: Hgb A1c MFr Bld  Date/Time Value Ref Range Status  08/03/2019 01:22 PM 14.9 (H) 4.8 - 5.6 % Final    Comment:    (NOTE) Pre diabetes:          5.7%-6.4%  Diabetes:              >6.4%  Glycemic control for   <7.0% adults with diabetes   03/04/2019 07:08 AM 16.6 (H) 4.8 - 5.6 % Final    Comment:    (NOTE) Pre diabetes:          5.7%-6.4% Diabetes:              >6.4% Glycemic control for   <7.0% adults with diabetes     CBG: Recent Labs  Lab 08/14/19 1619 08/14/19 1946 08/14/19 2330 08/15/19 0342 08/15/19 0722  GLUCAP 151* 130* 175* 177* 121*   Gladys Damme, MD [facility], PGY-2   PCCM:  59 yo M, OSA OHS, chronic resp failure, trach placed by ENT.  BP (!) 110/64   Pulse 55   Temp 98.4 F (36.9 C) (Axillary)   Resp 13   Ht 6' (1.829 m)   Wt (!) 158.1 kg   SpO2 98%   BMI 47.27 kg/m   Gen: Obese male HENT: NCAT, trach in place  Heart: RRR s1 s2  Lungs: BL vented breaths  Abd: obese, soft, nt  Neuro: confused, not directable, not following commands, but  moving all 4 extremities   Labs reviewed   A:  Chronic hypoxemic respiratory failure s/p trach, remains on vent  OSA OHS at baseline  Difficult airway, s/p tracheostomy  DM2 with hyperglycemia  Possible HCAP, BL infiltrates felt to be pulmonary edema  - staph epi is likely a contaminant from a bal specimen, also MDRO and current abx not even coveraging   P: Stop abx and observe for clinic sign of infection  Diuresis Follow UOP and BMP Replace K  Turn off continuous sedation  Off versed and dilaudid  Ok to use precedex   Possible LTACH candidate vs resp snf if we can get off vent   This patient is critically ill with multiple organ system failure; which, requires frequent high complexity decision making, assessment, support, evaluation, and titration of therapies. This was completed through the application of advanced monitoring technologies and extensive interpretation of multiple databases. During this encounter critical care time was devoted to patient care services described in this note for 32 minutes.  [facility] 08/15/2019 1:42 PM

## 2019-08-15 NOTE — Progress Notes (Signed)
eLink Physician-Brief Progress Note Patient Name: Timothy Le DOB: 1960/10/05 MRN: [id number]   Date of Service  08/15/2019  HPI/Events of Note  Agitation - Patient already on a Dilaudid IV infusion at 4 mg/hour and a Precedex IV infusion limited by HR going below 60.   eICU Interventions  Plan: 1. Versed IV infusion. Titrate to RASS = 0 to -1.      Intervention Category Major Interventions: Delirium, psychosis, severe agitation - evaluation and management  Leigh Kaeding [place] 08/15/2019, 3:45 AM

## 2019-08-16 ENCOUNTER — Inpatient Hospital Stay (HOSPITAL_COMMUNITY): Payer: Medicaid Other

## 2019-08-16 DIAGNOSIS — R001 Bradycardia, unspecified: Secondary | ICD-10-CM | POA: Diagnosis not present

## 2019-08-16 DIAGNOSIS — J9601 Acute respiratory failure with hypoxia: Secondary | ICD-10-CM | POA: Diagnosis not present

## 2019-08-16 LAB — GLUCOSE, CAPILLARY
Glucose-Capillary: 167 mg/dL — ABNORMAL HIGH (ref 70–99)
Glucose-Capillary: 239 mg/dL — ABNORMAL HIGH (ref 70–99)
Glucose-Capillary: 272 mg/dL — ABNORMAL HIGH (ref 70–99)
Glucose-Capillary: 303 mg/dL — ABNORMAL HIGH (ref 70–99)
Glucose-Capillary: 306 mg/dL — ABNORMAL HIGH (ref 70–99)
Glucose-Capillary: 323 mg/dL — ABNORMAL HIGH (ref 70–99)

## 2019-08-16 LAB — BASIC METABOLIC PANEL
Anion gap: 13 (ref 5–15)
BUN: 35 mg/dL — ABNORMAL HIGH (ref 6–20)
CO2: 34 mmol/L — ABNORMAL HIGH (ref 22–32)
Calcium: 9 mg/dL (ref 8.9–10.3)
Chloride: 92 mmol/L — ABNORMAL LOW (ref 98–111)
Creatinine, Ser: 1.09 mg/dL (ref 0.61–1.24)
GFR calc Af Amer: >60 mL/min (ref >60)
GFR calc non Af Amer: >60 mL/min (ref >60)
Glucose, Bld: 279 mg/dL — ABNORMAL HIGH (ref 70–99)
Potassium: 3 mmol/L — ABNORMAL LOW (ref 3.5–5.1)
Sodium: 139 mmol/L (ref 135–145)

## 2019-08-16 MED ORDER — POTASSIUM CHLORIDE 20 MEQ/15ML (10%) PO SOLN
40.0000 meq | Freq: Two times a day (BID) | ORAL | Status: AC
  Administered 2019-08-16 (×2): 40 meq
  Filled 2019-08-16 (×2): qty 30

## 2019-08-16 MED ORDER — DOCUSATE SODIUM 50 MG/5ML PO LIQD
100.0000 mg | Freq: Two times a day (BID) | ORAL | Status: DC
  Administered 2019-08-17 – 2019-09-05 (×28): 100 mg
  Filled 2019-08-16 (×30): qty 10

## 2019-08-16 MED ORDER — OXYCODONE HCL 5 MG/5ML PO SOLN: 5.0000 mg | ORAL | Status: DC | PRN

## 2019-08-16 MED ORDER — ADULT MULTIVITAMIN LIQUID CH
15.0000 mL | Freq: Every day | ORAL | Status: DC
  Administered 2019-08-16 – 2019-09-05 (×21): 15 mL
  Filled 2019-08-16 (×21): qty 15

## 2019-08-16 MED ORDER — IPRATROPIUM-ALBUTEROL 0.5-2.5 (3) MG/3ML IN SOLN
RESPIRATORY_TRACT | Status: AC
  Filled 2019-08-16: qty 3

## 2019-08-16 MED ORDER — INSULIN DETEMIR 100 UNIT/ML ~~LOC~~ SOLN
45.0000 [IU] | Freq: Two times a day (BID) | SUBCUTANEOUS | Status: DC
  Administered 2019-08-16: 45 [IU] via SUBCUTANEOUS
  Filled 2019-08-16 (×3): qty 0.45

## 2019-08-16 MED ORDER — PHENYLEPHRINE 40 MCG/ML (10ML) SYRINGE FOR IV PUSH (FOR BLOOD PRESSURE SUPPORT)
PREFILLED_SYRINGE | INTRAVENOUS | Status: AC
  Filled 2019-08-16: qty 10

## 2019-08-16 MED ORDER — HYDROMORPHONE HCL 1 MG/ML IJ SOLN
1.0000 mg | INTRAMUSCULAR | Status: DC | PRN
  Administered 2019-08-17 – 2019-08-26 (×3): 1 mg via INTRAVENOUS
  Filled 2019-08-16 (×4): qty 1

## 2019-08-16 MED ORDER — METHYLPREDNISOLONE SODIUM SUCC 125 MG IJ SOLR
80.0000 mg | Freq: Three times a day (TID) | INTRAMUSCULAR | Status: DC
  Administered 2019-08-16 – 2019-08-18 (×7): 80 mg via INTRAVENOUS
  Filled 2019-08-16 (×7): qty 2

## 2019-08-16 MED ORDER — INSULIN DETEMIR 100 UNIT/ML ~~LOC~~ SOLN
20.0000 [IU] | Freq: Once | SUBCUTANEOUS | Status: AC
  Administered 2019-08-16: 20 [IU] via SUBCUTANEOUS
  Filled 2019-08-16: qty 0.2

## 2019-08-16 MED FILL — Medication: Qty: 1 | Status: AC

## 2019-08-16 NOTE — Progress Notes (Addendum)
NAME:  Timothy Le, MRN:  [id number], DOB:  04/19/1960, LOS: 38 ADMISSION DATE:  07/30/2019, CONSULTATION DATE:  7/11 REFERRING MD:  Kathrynn Humble, CHIEF COMPLAINT:  Dyspnea   Brief History   59 year old male with baseline chronic respiratory failure on the basis of severe OHS/OSA complicated further by significant obstructive disease in the setting of COPD. Presents with acute on chronic hypoxic and hypercarbic respiratory failure. Admitted with the initial working diagnosis of pulmonary edema and decompensated diastolic heart failure. Per family patient was without CPAP for 2 weeks PTA as his machine broke. Has required mechanical ventilation for most of the hospitalization, extubated on 7/16, had stridor, unsuccessful re-intubation, had emergent tracheostomy which was difficult.  On 7/24 he became agitated and his tracheostomy dislodged, required emergent re-intubation.  Past Medical History  Past medical history of Asthma, Chronic respiratory failure with hypoxia ([place]) (07/09/2017), COPD (chronic obstructive pulmonary disease) ([place]), Diabetes mellitus without complication ([place]), Difficult intubation, Hypertension, Shortness of breath dyspnea, and Sleep apnea  Significant Hospital Events   On the night of 7/15-7/16 the patient had one event of desaturation to 70s, resolved with suctioning by RT and RN. Na increased from 147-152, free water flushes increased to 60 mL/hr. Febrile this morning to 100.7*F at 0830. Peep decreased to 8, saturations stable at 100%, FiO2 40%.  On 7/16, the patient was extubated but subsequently became stridorous. Attempted re-intubation was unsuccessful, even via FOB such that the patient had an #6 Shiley emergent trach placed by Trauma Surg. This was followed by atelectasis of the right lung, from which a blood clot was removed via FOB.  7/23 bronch mucus plug  7/24 period of lucidity around 0530, then severely agitated, tracheostomy dislodged, emergently  re-intubated  Consults:  Trauma Surg ENT  Procedures:  Endotracheal tube 7/11 Right IJ triple-lumen catheter 7/11 Trach 7/16 > 7/24 ETT 7/24 > 7/26 Trach 7/26>  Significant Diagnostic Tests:  Echocardiogram 7/11 >> no evidence of RV strain; LVEF 50-55%.Left ventricular diastolic function could not be evaluated.  Lower extremity ultrasound 7/11 >> no evidence of lower extremity DVT MRI brain 7/20 > normal MRI brain, extensive sinus mucosal disease, bilateral mastoid effusion, bilateral proptosis  Micro Data:  7/11 SARS COV 2> negative 7/13 resp culture > OPF 7/23 BAL LLL > staph epi   Antimicrobials:  Pip/tazo - 7/16>7/21  Cefepime 7/24 > 7/25  Ceftriaxone 7/24 > 7/27  Interim history/subjective:   Overnight called bedside for hypoxia , bradycardia , and inability to ventilate. Improved with suctioning, fluid bolus, and phenylephrine.    Objective   Blood pressure (!) 156/90, pulse (!) 147, temperature 98.8 F (37.1 C), temperature source Axillary, resp. rate (!) 25, height 6' (1.829 m), weight (!) 158 kg, SpO2 100 %.    Vent Mode: PRVC FiO2 (%):  [60 %-100 %] 100 % Set Rate:  [15 bmp] 15 bmp Vt Set:  [470 mL] 470 mL PEEP:  [8 cmH20] 8 cmH20 Plateau Pressure:  [16 cmH20-22 cmH20] 18 cmH20   Intake/Output Summary (Last 24 hours) at 08/16/2019 0833 Last data filed at 08/16/2019 0800 Gross per 24 hour  Intake 2375.53 ml  Output 7980 ml  Net -5604.47 ml   Filed Weights   08/14/19 0408 08/15/19 0500 08/16/19 0500  Weight: (!) 152.1 kg (!) 158.1 kg (!) 158 kg    Examination: General:  In bed on vent HENT: NCAT, tracheostomy in place PULM: CTAB, vent supported breathing CV: RRR, no mgr GI: BS+, soft, nontender MSK: normal bulk and tone,  no LE edema Neuro: eyes open, moving extremities, not following commands  CXR personally reviewed: Improved pulmonary edema compared to previous xray.   Resolved Hospital Problem list   AKI  Assessment & Plan:  Acute  hypoxic respiratory failure  OHS and OSA at baseline Prolonged mechanical ventilation now s/p #7 adjustable length tracheostomy placed 7/26. Full mechanical vent support, VAP prevention Continue on Precedex for sedation  PAD protocol, RASS target 0 Solumedrol 80 mg q8hrs  DuoNeb scheduled q6hrs  DM2 with hyperglycemia Above hospital goal of < 180 since starting Solumedrol this am, will  Basal coverage - Levimir 45 units BID Tube feed coverage - 4 units Aspart q4hrs Bolus - SSI-R  Hypokalemia Monitor BMET and UOP Replete morning K of 3  HCAP left lower lobe: staph epi Stopped antibiotics , no objective signs of infection overnight. Will add CBC to morning labs Susceptibilities are multi-drug resistant, unlikely truly from BAL, most likely contaminant Will continue to monitor and if other objective signs of infection (leukocytosis, fever) can treat with doxycycline or linezolid per ID pharmacy  Hematuria F/U U/A in 4-6 weeks Thyroid goiter causing narrowing of airway F/U with ENT prior to decannulation   Best practice:  Diet: Tube feeding Pain/Anxiety/Delirium protocol (if indicated): as above VAP protocol (if indicated): yes DVT prophylaxis: lovenox GI prophylaxis: ppi Glucose control: as above Mobility: bed rest Code Status: full Family Communication: Updated his sister [name] by phone on 7/28 Disposition: remain in ICU  Labs   CBC: Recent Labs  Lab 08/10/19 0418 08/11/19 0503 08/12/19 0534 08/13/19 0429 08/14/19 0404  WBC 8.3 10.6* 13.2* 9.5 9.9  NEUTROABS  --   --   --  6.3 5.7  HGB 9.3* 9.0* 9.5* 8.5* 8.4*  HCT 32.3* 30.9* 32.0* 28.9* 28.2*  MCV 94.4 94.5 93.8 94.1 95.3  PLT 222 239 270 209 161    Basic Metabolic Panel: Recent Labs  Lab 08/11/19 0506 08/12/19 0534 08/13/19 0429 08/14/19 0404 08/15/19 0443  NA 141 139 138 139 141  K 3.8 3.2* 3.5 3.4* 3.6  CL 101 98 100 102 103  CO2 32 30 30 32 32  GLUCOSE 214* 147* 141* 96 163*  BUN 42* 38* 39*  44* 39*  CREATININE 1.17 1.15 1.12 1.12 1.04  CALCIUM 8.8* 8.9 8.5* 8.1* 8.3*   GFR: Estimated Creatinine Clearance: 121.7 mL/min (by C-G formula based on SCr of 1.04 mg/dL). Recent Labs  Lab 08/11/19 0503 08/12/19 0534 08/13/19 0429 08/14/19 0404  WBC 10.6* 13.2* 9.5 9.9    Liver Function Tests: Recent Labs  Lab 08/13/19 0429  AST 66*  ALT 46*  ALKPHOS 116  BILITOT 0.7  PROT 7.6  ALBUMIN 1.7*   No results for input(s): LIPASE, AMYLASE in the last 168 hours. No results for input(s): AMMONIA in the last 168 hours.  ABG    Component Value Date/Time   PHART 7.340 (L) 07/31/2019 0516   PCO2ART 57.1 (H) 07/31/2019 0516   PO2ART 172 (H) 07/31/2019 0516   HCO3 30.6 (H) 07/31/2019 0516   TCO2 32 07/31/2019 0516   ACIDBASEDEF 1.0 07/30/2019 1724   O2SAT 99.0 07/31/2019 0516     Coagulation Profile: No results for input(s): INR, PROTIME in the last 168 hours.  Cardiac Enzymes: No results for input(s): CKTOTAL, CKMB, CKMBINDEX, TROPONINI in the last 168 hours.  HbA1C: Hgb A1c MFr Bld  Date/Time Value Ref Range Status  08/03/2019 01:22 PM 14.9 (H) 4.8 - 5.6 % Final    Comment:    (  NOTE) Pre diabetes:          5.7%-6.4%  Diabetes:              >6.4%  Glycemic control for   <7.0% adults with diabetes   03/04/2019 07:08 AM 16.6 (H) 4.8 - 5.6 % Final    Comment:    (NOTE) Pre diabetes:          5.7%-6.4% Diabetes:              >6.4% Glycemic control for   <7.0% adults with diabetes     CBG: Recent Labs  Lab 08/15/19 1529 08/15/19 1916 08/15/19 2314 08/16/19 0358 08/16/19 Dailey   Tamsen Snider, MD PGY2 Internal Medicine [phone]   PCCM:  59 yo m, chronic resp failure, trach in place. Surgical trach by ENT  BP 126/72   Pulse 96   Temp 98.9 F (37.2 C) (Axillary)   Resp (!) 25   Ht 6' (1.829 m)   Wt (!) 158 kg   SpO2 97%   BMI 47.24 kg/m   Gen: obese male HENT: trach in place  Heart: RRR s1 s2 Lungs:  BL vented breath sounds   Labs: reviewed   A:  AHRF, chronic resp failure, trach in place  OHS OSA baseline  DM2 HCAP LLL  P: Remain on precedex only  PAD protocol  Full vent support  Glycemic control  Wean from vent as tolerated   This patient is critically ill with multiple organ system failure; which, requires frequent high complexity decision making, assessment, support, evaluation, and titration of therapies. This was completed through the application of advanced monitoring technologies and extensive interpretation of multiple databases. During this encounter critical care time was devoted to patient care services described in this note for 32 minutes.   [facility] 08/16/2019 4:21 PM

## 2019-08-16 NOTE — Progress Notes (Addendum)
Critical care attending progress note.   Called to bedside for hypoxia, bradycardia and inability to ventilate.   Initial concern that tracheostomy had become dislodged but color change on end-tidal positive.   Became easier to bag after suctioning.   Hypotensive, given 549ml N/S and 200 phenyephrine.   Poor air entry bilaterally with wheezing and prolonged expiratory phase bilaterally.   Saturation now 100% and BP improved to 120/80. Now tachycardic to 110's.    Albuterol treatment ordered.   CXR:  Diffusely opacified - appears to be technique related, otherwise no clear infiltrate or collapse compared to previous film (personal review)  Post treatment:  Wheezing more audibly, but poor exhaled volume.  Will repeat bronchodilator.  Added course of steroids.   CRITICAL CARE Performed by: Kipp Brood   Total critical care time: 40 minutes  Critical care time was exclusive of separately billable procedures and treating other patients.  Critical care was necessary to treat or prevent imminent or life-threatening deterioration.  Critical care was time spent personally by me on the following activities: development of treatment plan with patient and/or surrogate as well as nursing, discussions with consultants, evaluation of patient's response to treatment, examination of patient, obtaining history from patient or surrogate, ordering and performing treatments and interventions, ordering and review of laboratory studies, ordering and review of radiographic studies, pulse oximetry, re-evaluation of patient's condition and participation in multidisciplinary rounds.  Kipp Brood, MD [facility] ICU Physician [place]  Pager: [phone] Mobile: [phone] After hours: [phone].

## 2019-08-17 DIAGNOSIS — J9621 Acute and chronic respiratory failure with hypoxia: Secondary | ICD-10-CM | POA: Diagnosis not present

## 2019-08-17 LAB — BASIC METABOLIC PANEL
Anion gap: 7 (ref 5–15)
BUN: 37 mg/dL — ABNORMAL HIGH (ref 6–20)
CO2: 38 mmol/L — ABNORMAL HIGH (ref 22–32)
Calcium: 8.6 mg/dL — ABNORMAL LOW (ref 8.9–10.3)
Chloride: 95 mmol/L — ABNORMAL LOW (ref 98–111)
Creatinine, Ser: 0.97 mg/dL (ref 0.61–1.24)
GFR calc Af Amer: >60 mL/min (ref >60)
GFR calc non Af Amer: >60 mL/min (ref >60)
Glucose, Bld: 309 mg/dL — ABNORMAL HIGH (ref 70–99)
Potassium: 3.8 mmol/L (ref 3.5–5.1)
Sodium: 140 mmol/L (ref 135–145)

## 2019-08-17 LAB — POCT I-STAT 7, (LYTES, BLD GAS, ICA,H+H)
Acid-Base Excess: 17 mmol/L — ABNORMAL HIGH (ref 0.0–2.0)
Bicarbonate: 42.1 mmol/L — ABNORMAL HIGH (ref 20.0–28.0)
Calcium, Ion: 1.13 mmol/L — ABNORMAL LOW (ref 1.15–1.40)
HCT: 28 % — ABNORMAL LOW (ref 39.0–52.0)
Hemoglobin: 9.5 g/dL — ABNORMAL LOW (ref 13.0–17.0)
O2 Saturation: 97 %
Patient temperature: 99.3
Potassium: 3.8 mmol/L (ref 3.5–5.1)
Sodium: 143 mmol/L (ref 135–145)
TCO2: 44 mmol/L — ABNORMAL HIGH (ref 22–32)
pCO2 arterial: 55.7 mmHg — ABNORMAL HIGH (ref 32.0–48.0)
pH, Arterial: 7.489 — ABNORMAL HIGH (ref 7.350–7.450)
pO2, Arterial: 93 mmHg (ref 83.0–108.0)

## 2019-08-17 LAB — GLUCOSE, CAPILLARY
Glucose-Capillary: 292 mg/dL — ABNORMAL HIGH (ref 70–99)
Glucose-Capillary: 242 mg/dL — ABNORMAL HIGH (ref 70–99)
Glucose-Capillary: 300 mg/dL — ABNORMAL HIGH (ref 70–99)
Glucose-Capillary: 295 mg/dL — ABNORMAL HIGH (ref 70–99)
Glucose-Capillary: 312 mg/dL — ABNORMAL HIGH (ref 70–99)
Glucose-Capillary: 291 mg/dL — ABNORMAL HIGH (ref 70–99)

## 2019-08-17 LAB — CBC
HCT: 27.7 % — ABNORMAL LOW (ref 39.0–52.0)
Hemoglobin: 8.2 g/dL — ABNORMAL LOW (ref 13.0–17.0)
MCH: 27.2 pg (ref 26.0–34.0)
MCHC: 29.6 g/dL — ABNORMAL LOW (ref 30.0–36.0)
MCV: 92 fL (ref 80.0–100.0)
Platelets: 234 10*3/uL (ref 150–400)
RBC: 3.01 MIL/uL — ABNORMAL LOW (ref 4.22–5.81)
RDW: 16.4 % — ABNORMAL HIGH (ref 11.5–15.5)
WBC: 12.2 10*3/uL — ABNORMAL HIGH (ref 4.0–10.5)
nRBC: 0.6 % — ABNORMAL HIGH (ref 0.0–0.2)

## 2019-08-17 LAB — PHOSPHORUS: Phosphorus: 1.7 mg/dL — ABNORMAL LOW (ref 2.5–4.6)

## 2019-08-17 LAB — MAGNESIUM: Magnesium: 1.4 mg/dL — ABNORMAL LOW (ref 1.7–2.4)

## 2019-08-17 MED ORDER — QUETIAPINE FUMARATE 25 MG PO TABS
25.0000 mg | ORAL_TABLET | Freq: Once | ORAL | Status: AC
  Administered 2019-08-17: 25 mg via ORAL
  Filled 2019-08-17: qty 1

## 2019-08-17 MED ORDER — CALCIUM GLUCONATE-NACL 1-0.675 GM/50ML-% IV SOLN
1.0000 g | Freq: Once | INTRAVENOUS | Status: DC
  Filled 2019-08-17: qty 50

## 2019-08-17 MED ORDER — INSULIN DETEMIR 100 UNIT/ML ~~LOC~~ SOLN
50.0000 [IU] | Freq: Two times a day (BID) | SUBCUTANEOUS | Status: DC
  Administered 2019-08-17 – 2019-08-20 (×7): 50 [IU] via SUBCUTANEOUS
  Filled 2019-08-17 (×8): qty 0.5

## 2019-08-17 MED ORDER — CHLORHEXIDINE GLUCONATE CLOTH 2 % EX PADS
6.0000 | MEDICATED_PAD | Freq: Every day | CUTANEOUS | Status: DC
  Administered 2019-08-18 – 2019-09-15 (×27): 6 via TOPICAL

## 2019-08-17 MED ORDER — IPRATROPIUM-ALBUTEROL 0.5-2.5 (3) MG/3ML IN SOLN
3.0000 mL | Freq: Two times a day (BID) | RESPIRATORY_TRACT | Status: DC
  Administered 2019-08-17 – 2019-08-22 (×11): 3 mL via RESPIRATORY_TRACT
  Filled 2019-08-17 (×11): qty 3

## 2019-08-17 MED ORDER — CALCIUM GLUCONATE-NACL 1-0.675 GM/50ML-% IV SOLN
1.0000 g | Freq: Once | INTRAVENOUS | Status: AC
  Administered 2019-08-17: 1000 mg via INTRAVENOUS
  Filled 2019-08-17: qty 50

## 2019-08-17 MED ORDER — POTASSIUM PHOSPHATES 15 MMOLE/5ML IV SOLN
30.0000 mmol | Freq: Once | INTRAVENOUS | Status: AC
  Administered 2019-08-17: 30 mmol via INTRAVENOUS
  Filled 2019-08-17: qty 10

## 2019-08-17 MED ORDER — INSULIN ASPART 100 UNIT/ML ~~LOC~~ SOLN
6.0000 [IU] | SUBCUTANEOUS | Status: DC
  Administered 2019-08-17 – 2019-08-18 (×6): 6 [IU] via SUBCUTANEOUS

## 2019-08-17 MED ORDER — MAGNESIUM SULFATE 4 GM/100ML IV SOLN
4.0000 g | Freq: Once | INTRAVENOUS | Status: AC
  Administered 2019-08-17: 4 g via INTRAVENOUS
  Filled 2019-08-17: qty 100

## 2019-08-17 NOTE — Progress Notes (Addendum)
Cassell Smiles, RRT and RN informed this RT that patient's vent was giving warning of not delivering pressure.  Went in and lavaged patient.  Had to suction patient by holding trach and pushing in till patient started coughing, then pulled back all while holding trach.  Was able to get back some secretions that way.  First pass the secretions were tenaciously thick, second pass was better.  Vt volumes are better and no more pressure alarms given.  Patient is moving around in bed right much as well as moving head from side to side a lot.  I wonder if pressure is being put on balloon; have had to put air in several times. Will continue to monitor.

## 2019-08-17 NOTE — Progress Notes (Addendum)
NAME:  Timothy Le, MRN:  [id number], DOB:  [birth year], LOS: 49 ADMISSION DATE:  07/30/2019, CONSULTATION DATE:  7/11 REFERRING MD:  Kathrynn Humble, CHIEF COMPLAINT:  Dyspnea   Brief History   59 year old male with baseline chronic respiratory failure on the basis of severe OHS/OSA complicated further by significant obstructive disease in the setting of COPD. Presents with acute on chronic hypoxic and hypercarbic respiratory failure. Admitted with the initial working diagnosis of pulmonary edema and decompensated diastolic heart failure. Per family patient was without CPAP for 2 weeks PTA as his machine broke. Has required mechanical ventilation for most of the hospitalization, extubated on 7/16, had stridor, unsuccessful re-intubation, had emergent tracheostomy which was difficult.  On 7/24 he became agitated and his tracheostomy dislodged, required emergent re-intubation.  Past Medical History  Past medical history of Asthma, Chronic respiratory failure with hypoxia ([place]) (07/09/2017), COPD (chronic obstructive pulmonary disease) ([place]), Diabetes mellitus without complication ([place]), Difficult intubation, Hypertension, Shortness of breath dyspnea, and Sleep apnea  Significant Hospital Events   On the night of 7/15-7/16 the patient had one event of desaturation to 70s, resolved with suctioning by RT and RN. Na increased from 147-152, free water flushes increased to 60 mL/hr. Febrile this morning to 100.7*F at 0830. Peep decreased to 8, saturations stable at 100%, FiO2 40%.  On 7/16, the patient was extubated but subsequently became stridorous. Attempted re-intubation was unsuccessful, even via FOB such that the patient had an #6 Shiley emergent trach placed by Trauma Surg. This was followed by atelectasis of the right lung, from which a blood clot was removed via FOB.  7/23 bronch mucus plug  7/24 period of lucidity around 0530, then severely agitated, tracheostomy dislodged, emergently  re-intubated  Consults:  Trauma Surg ENT  Procedures:  Endotracheal tube 7/11 Right IJ triple-lumen catheter 7/11 Trach 7/16 > 7/24 ETT 7/24 > 7/26 Trach 7/26>  Significant Diagnostic Tests:  Echocardiogram 7/11 >> no evidence of RV strain; LVEF 50-55%.Left ventricular diastolic function could not be evaluated.  Lower extremity ultrasound 7/11 >> no evidence of lower extremity DVT MRI brain 7/20 > normal MRI brain, extensive sinus mucosal disease, bilateral mastoid effusion, bilateral proptosis  Micro Data:  7/11 SARS COV 2> negative 7/13 resp culture > OPF 7/23 BAL LLL > staph epi   Antimicrobials:  Pip/tazo - 7/16>7/21  Cefepime 7/24 > 7/25  Ceftriaxone 7/24 > 7/27  Interim history/subjective:  Needed suctioning overnight for thick secretions. Patient tolerated short pressure support trial this am.  Follows a command to move arms and legs. Strength is limited.    Objective   Blood pressure 126/72, pulse 83, temperature 99.5 F (37.5 C), temperature source Axillary, resp. rate 23, height 6' (1.829 m), weight (!) 148 kg, SpO2 94 %.    Vent Mode: PRVC FiO2 (%):  [50 %-60 %] 50 % Set Rate:  [15 bmp] 15 bmp Vt Set:  [470 mL] 470 mL PEEP:  [8 cmH20] 8 cmH20 Plateau Pressure:  [16 cmH20-22 cmH20] 21 cmH20   Intake/Output Summary (Last 24 hours) at 08/17/2019 0857 Last data filed at 08/17/2019 0800 Gross per 24 hour  Intake 2692.59 ml  Output 3250 ml  Net -557.41 ml   Filed Weights   08/15/19 0500 08/16/19 0500 08/17/19 0400  Weight: (!) 158.1 kg (!) 158 kg (!) 148 kg    Examination: General:  In bed on vent, eyes open, chronically ill appearing  HENT: NCAT, tracheostomy in place PULM: CTAB, vent supported breathing CV: RRR, no mgr  GI: BS+, soft, nontender MSK: normal bulk and tone, no LE edema Neuro: eyes open, moving extremities poorly, but on command  CXR personally reviewed: Improved pulmonary edema compared to previous xray.   Noted labs:  K 3.8 , Mg  1.4 , Phos 1.7 , istat calcium 1.13  ABG    Component Value Date/Time   PHART 7.489 (H) 08/17/2019 0451   PCO2ART 55.7 (H) 08/17/2019 0451   PO2ART 93 08/17/2019 0451   HCO3 42.1 (H) 08/17/2019 0451   TCO2 44 (H) 08/17/2019 0451   ACIDBASEDEF 1.0 07/30/2019 1724   O2SAT 97.0 08/17/2019 [place] [facility] Problem list   AKI  Assessment & Plan:  Acute hypoxic respiratory failure  OHS and OSA at baseline Prolonged mechanical ventilation now s/p #7 adjustable length tracheostomy placed 7/26. Full mechanical vent support, VAP prevention Continue to work toward weaning with pressure support trials Wean Precedex as tolerated , PRN Versed  Oxycodone for moderate pain, PRN Dilaudid for Severe pain PAD protocol, RASS target 0 Solumedrol 80 mg q8hrs  DuoNeb scheduled q6hrs  DM2 with hyperglycemia Above hospital goal of < 180 since starting Solumedrol. Have titrated up Levimir from 40 - 50 units daily. If continued to be elevated will increase tube feed coverage .   Basal coverage - Levimir 50 units BID Tube feed coverage - 4 units Aspart q4hrs Bolus - SSI-R  Hypomagnesia Hypophosphatemia Hypocalcemia Monitor BMET and UOP Given repletion this am  HCAP left lower lobe: staph epi Stopped antibiotics , no objective signs of infection overnight. Will add CBC to morning labs Susceptibilities are multi-drug resistant, unlikely truly from BAL, most likely contaminant Will continue to monitor and if other objective signs of infection (leukocytosis, fever) can treat with doxycycline or linezolid per ID pharmacy  Hematuria F/U U/A in 4-6 weeks Thyroid goiter causing narrowing of airway F/U with ENT prior to decannulation   Best practice:  Diet: Tube feeding Pain/Anxiety/Delirium protocol (if indicated): as above VAP protocol (if indicated): yes DVT prophylaxis: lovenox GI prophylaxis: ppi Glucose control: as above Mobility: bed rest Code Status: full Family Communication:  Updated his sister [name] by phone on 7/29 Disposition: remain in ICU  Labs   CBC: Recent Labs  Lab 08/11/19 0503 08/11/19 0503 08/12/19 0534 08/13/19 0429 08/14/19 0404 08/17/19 0418 08/17/19 0451  WBC 10.6*  --  13.2* 9.5 9.9 12.2*  --   NEUTROABS  --   --   --  6.3 5.7  --   --   HGB 9.0*   < > 9.5* 8.5* 8.4* 8.2* 9.5*  HCT 30.9*   < > 32.0* 28.9* 28.2* 27.7* 28.0*  MCV 94.5  --  93.8 94.1 95.3 92.0  --   PLT 239  --  270 209 199 234  --    < > = values in this interval not displayed.    Basic Metabolic Panel: Recent Labs  Lab 08/13/19 0429 08/13/19 0429 08/14/19 0404 08/15/19 0443 08/16/19 0852 08/17/19 0418 08/17/19 0451  NA 138   < > 139 141 139 140 143  K 3.5   < > 3.4* 3.6 3.0* 3.8 3.8  CL 100  --  102 103 92* 95*  --   CO2 30  --  32 32 34* 38*  --   GLUCOSE 141*  --  96 163* 279* 309*  --   BUN 39*  --  44* 39* 35* 37*  --   CREATININE 1.12  --  1.12 1.04 1.09  0.97  --   CALCIUM 8.5*  --  8.1* 8.3* 9.0 8.6*  --   MG  --   --   --   --   --  1.4*  --   PHOS  --   --   --   --   --  1.7*  --    < > = values in this interval not displayed.   GFR: Estimated Creatinine Clearance: 125.7 mL/min (by C-G formula based on SCr of 0.97 mg/dL). Recent Labs  Lab 08/12/19 0534 08/13/19 0429 08/14/19 0404 08/17/19 0418  WBC 13.2* 9.5 9.9 12.2*    Liver Function Tests: Recent Labs  Lab 08/13/19 0429  AST 66*  ALT 46*  ALKPHOS 116  BILITOT 0.7  PROT 7.6  ALBUMIN 1.7*   No results for input(s): LIPASE, AMYLASE in the last 168 hours. No results for input(s): AMMONIA in the last 168 hours.  ABG    Component Value Date/Time   PHART 7.489 (H) 08/17/2019 0451   PCO2ART 55.7 (H) 08/17/2019 0451   PO2ART 93 08/17/2019 0451   HCO3 42.1 (H) 08/17/2019 0451   TCO2 44 (H) 08/17/2019 0451   ACIDBASEDEF 1.0 07/30/2019 1724   O2SAT 97.0 08/17/2019 0451     Coagulation Profile: No results for input(s): INR, PROTIME in the last 168 hours.  Cardiac  Enzymes: No results for input(s): CKTOTAL, CKMB, CKMBINDEX, TROPONINI in the last 168 hours.  HbA1C: Hgb A1c MFr Bld  Date/Time Value Ref Range Status  08/03/2019 01:22 PM 14.9 (H) 4.8 - 5.6 % Final    Comment:    (NOTE) Pre diabetes:          5.7%-6.4%  Diabetes:              >6.4%  Glycemic control for   <7.0% adults with diabetes   03/04/2019 07:08 AM 16.6 (H) 4.8 - 5.6 % Final    Comment:    (NOTE) Pre diabetes:          5.7%-6.4% Diabetes:              >6.4% Glycemic control for   <7.0% adults with diabetes     CBG: Recent Labs  Lab 08/16/19 1510 08/16/19 1930 08/16/19 2341 08/17/19 0256 08/17/19 [place]   Tamsen Snider, MD PGY2 Internal Medicine [phone]   PCCM:  59 yo, obese, trach vent support, still encephalopathic. Mixed reasons, heavy sedation for a long time and possibly anoxia during urgent trach event and multiple episode of desat   BP 126/72 (BP Location: Left Wrist)   Pulse 83   Temp 99.5 F (37.5 C) (Axillary)   Resp 23   Ht 6' (1.829 m)   Wt (!) 148 kg   SpO2 94%   BMI 44.25 kg/m   Gen: obese male, trached on vent  HENT: trach in place  Heart: RRR, s1 s2 Lungs: BL vented breaths  Labs reviewed   A:  Chronc resp failure, trach vent dependent  I suspect will be a prolonged vent wean  Mental status is still altered  Acute metabolic encephalopathy and possible anoxic encephalopathy   P: Remains on full vent support Trials of PS daily  Reduce sedation needs  Only on precedex currently  Likely needs LTACH placement   This patient is critically ill with multiple organ system failure; which, requires frequent high complexity decision making, assessment, support, evaluation, and titration of therapies. This was completed through the application of  advanced monitoring technologies and extensive interpretation of multiple databases. During this encounter critical care time was devoted to patient care  services described in this note for 32 minutes.  Garner Nash, DO [facility] 08/17/2019 2:33 PM

## 2019-08-17 NOTE — Progress Notes (Signed)
Pharmacy Electrolyte Replacement  Recent Labs:  Recent Labs    08/17/19 0418 08/17/19 0418 08/17/19 0451  K 3.8   < > 3.8  MG 1.4*  --   --   PHOS 1.7*  --   --   CREATININE 0.97  --   --    < > = values in this interval not displayed.    Low Critical Values (K </= 2.5, Phos </= 1, Mg </= 1) Present: None  Plan:  KPhos 30 x 1 Mg 4 g x 1 Ca 1 g x 1  Barth Kirks, PharmD, BCPS, BCCCP Clinical Pharmacist [phone]  Please check AMION for all [place] numbers  08/17/2019 8:09 AM

## 2019-08-18 ENCOUNTER — Inpatient Hospital Stay (HOSPITAL_COMMUNITY): Payer: Medicaid Other

## 2019-08-18 LAB — POCT I-STAT 7, (LYTES, BLD GAS, ICA,H+H)
Acid-Base Excess: 16 mmol/L — ABNORMAL HIGH (ref 0.0–2.0)
Bicarbonate: 41.7 mmol/L — ABNORMAL HIGH (ref 20.0–28.0)
Calcium, Ion: 1.13 mmol/L — ABNORMAL LOW (ref 1.15–1.40)
HCT: 26 % — ABNORMAL LOW (ref 39.0–52.0)
Hemoglobin: 8.8 g/dL — ABNORMAL LOW (ref 13.0–17.0)
O2 Saturation: 100 %
Patient temperature: 99.4
Potassium: 4.3 mmol/L (ref 3.5–5.1)
Sodium: 139 mmol/L (ref 135–145)
TCO2: 43 mmol/L — ABNORMAL HIGH (ref 22–32)
pCO2 arterial: 56.2 mmHg — ABNORMAL HIGH (ref 32.0–48.0)
pH, Arterial: 7.48 — ABNORMAL HIGH (ref 7.350–7.450)
pO2, Arterial: 209 mmHg — ABNORMAL HIGH (ref 83.0–108.0)

## 2019-08-18 LAB — GLUCOSE, CAPILLARY
Glucose-Capillary: 296 mg/dL — ABNORMAL HIGH (ref 70–99)
Glucose-Capillary: 284 mg/dL — ABNORMAL HIGH (ref 70–99)
Glucose-Capillary: 362 mg/dL — ABNORMAL HIGH (ref 70–99)
Glucose-Capillary: 298 mg/dL — ABNORMAL HIGH (ref 70–99)
Glucose-Capillary: 229 mg/dL — ABNORMAL HIGH (ref 70–99)
Glucose-Capillary: 212 mg/dL — ABNORMAL HIGH (ref 70–99)

## 2019-08-18 LAB — CBC
HCT: 29.1 % — ABNORMAL LOW (ref 39.0–52.0)
Hemoglobin: 8.6 g/dL — ABNORMAL LOW (ref 13.0–17.0)
MCH: 28 pg (ref 26.0–34.0)
MCHC: 29.6 g/dL — ABNORMAL LOW (ref 30.0–36.0)
MCV: 94.8 fL (ref 80.0–100.0)
Platelets: 266 10*3/uL (ref 150–400)
RBC: 3.07 MIL/uL — ABNORMAL LOW (ref 4.22–5.81)
RDW: 16.8 % — ABNORMAL HIGH (ref 11.5–15.5)
WBC: 11.3 10*3/uL — ABNORMAL HIGH (ref 4.0–10.5)
nRBC: 0.4 % — ABNORMAL HIGH (ref 0.0–0.2)

## 2019-08-18 LAB — BASIC METABOLIC PANEL
Anion gap: 9 (ref 5–15)
BUN: 33 mg/dL — ABNORMAL HIGH (ref 6–20)
CO2: 34 mmol/L — ABNORMAL HIGH (ref 22–32)
Calcium: 8.3 mg/dL — ABNORMAL LOW (ref 8.9–10.3)
Chloride: 97 mmol/L — ABNORMAL LOW (ref 98–111)
Creatinine, Ser: 0.87 mg/dL (ref 0.61–1.24)
GFR calc Af Amer: >60 mL/min (ref >60)
GFR calc non Af Amer: >60 mL/min (ref >60)
Glucose, Bld: 313 mg/dL — ABNORMAL HIGH (ref 70–99)
Potassium: 4.2 mmol/L (ref 3.5–5.1)
Sodium: 140 mmol/L (ref 135–145)

## 2019-08-18 LAB — MAGNESIUM: Magnesium: 1.8 mg/dL (ref 1.7–2.4)

## 2019-08-18 LAB — PHOSPHORUS: Phosphorus: 3.2 mg/dL (ref 2.5–4.6)

## 2019-08-18 LAB — TRIGLYCERIDES: Triglycerides: 84 mg/dL (ref <150)

## 2019-08-18 MED ORDER — PROPOFOL 10 MG/ML IV BOLUS
INTRAVENOUS | Status: AC
  Administered 2019-08-18: 150 mg via INTRAVENOUS
  Filled 2019-08-18: qty 20

## 2019-08-18 MED ORDER — FENTANYL CITRATE (PF) 100 MCG/2ML IJ SOLN
INTRAMUSCULAR | Status: AC
  Filled 2019-08-18: qty 6

## 2019-08-18 MED ORDER — ROCURONIUM BROMIDE 50 MG/5ML IV SOLN: 150.0000 mg | Freq: Once | INTRAVENOUS | Status: AC

## 2019-08-18 MED ORDER — PROPOFOL 10 MG/ML IV BOLUS: 150.0000 mg | Freq: Once | INTRAVENOUS | Status: AC

## 2019-08-18 MED ORDER — MIDAZOLAM HCL 2 MG/2ML IJ SOLN
INTRAMUSCULAR | Status: AC
  Administered 2019-08-18: 4 mg via INTRAVENOUS
  Filled 2019-08-18: qty 4

## 2019-08-18 MED ORDER — VECURONIUM BROMIDE 10 MG IV SOLR: 10.0000 mg | Freq: Once | INTRAVENOUS | Status: DC

## 2019-08-18 MED ORDER — ROCURONIUM BROMIDE 10 MG/ML (PF) SYRINGE
PREFILLED_SYRINGE | INTRAVENOUS | Status: AC
  Filled 2019-08-18: qty 10

## 2019-08-18 MED ORDER — VECURONIUM BROMIDE 10 MG IV SOLR
INTRAVENOUS | Status: AC
  Filled 2019-08-18: qty 10

## 2019-08-18 MED ORDER — INSULIN ASPART 100 UNIT/ML ~~LOC~~ SOLN
10.0000 [IU] | SUBCUTANEOUS | Status: DC
  Administered 2019-08-18 – 2019-08-27 (×52): 10 [IU] via SUBCUTANEOUS

## 2019-08-18 MED ORDER — METHYLPREDNISOLONE SODIUM SUCC 125 MG IJ SOLR
80.0000 mg | Freq: Every day | INTRAMUSCULAR | Status: DC
  Administered 2019-08-19: 80 mg via INTRAVENOUS
  Filled 2019-08-18: qty 2

## 2019-08-18 MED ORDER — ROCURONIUM BROMIDE 50 MG/5ML IV SOLN
150.0000 mg | Freq: Once | INTRAVENOUS | Status: AC
  Administered 2019-08-18: 150 mg via INTRAVENOUS

## 2019-08-18 MED ORDER — MIDAZOLAM HCL 2 MG/2ML IJ SOLN
INTRAMUSCULAR | Status: AC
  Filled 2019-08-18: qty 6

## 2019-08-18 MED ORDER — PROPOFOL 1000 MG/100ML IV EMUL
5.0000 ug/kg/min | INTRAVENOUS | Status: DC
  Administered 2019-08-18 (×4): 40 ug/kg/min via INTRAVENOUS
  Administered 2019-08-18: 20 ug/kg/min via INTRAVENOUS
  Administered 2019-08-18: 30 ug/kg/min via INTRAVENOUS
  Administered 2019-08-18 – 2019-08-19 (×9): 40 ug/kg/min via INTRAVENOUS
  Administered 2019-08-20: 35 ug/kg/min via INTRAVENOUS
  Administered 2019-08-20: 40 ug/kg/min via INTRAVENOUS
  Administered 2019-08-20: 30 ug/kg/min via INTRAVENOUS
  Filled 2019-08-18 (×19): qty 100

## 2019-08-18 MED ORDER — SODIUM CHLORIDE 0.9 % IV BOLUS: 1000.0000 mL | Freq: Once | INTRAVENOUS | Status: AC

## 2019-08-18 MED ORDER — MIDAZOLAM HCL 2 MG/2ML IJ SOLN: 4.0000 mg | Freq: Once | INTRAMUSCULAR | Status: AC

## 2019-08-18 MED ORDER — MAGNESIUM SULFATE 2 GM/50ML IV SOLN
2.0000 g | Freq: Once | INTRAVENOUS | Status: AC
  Administered 2019-08-18: 2 g via INTRAVENOUS
  Filled 2019-08-18: qty 50

## 2019-08-18 MED ORDER — SODIUM CHLORIDE 0.9 % IV BOLUS
1000.0000 mL | Freq: Once | INTRAVENOUS | Status: AC
  Administered 2019-08-18: 1000 mL via INTRAVENOUS

## 2019-08-18 NOTE — Progress Notes (Signed)
eLink Physician-Brief Progress Note Patient Name: Timothy Le DOB: 1960/07/22 MRN: [id number]   Date of Service  08/18/2019  HPI/Events of Note  Patient with significant ventilator dyssynchrony and inadequate tidal volumes due to a possible problem with his tracheostomy causing a leak.  eICU Interventions  Versed 4 mg iv + Vecuronium 10 mg iv x 1, PCCM ground crew requested to go to the room to evaluate the tracheostomy.        Kerry Kass Yannis Gumbs 08/18/2019, 2:21 AM

## 2019-08-18 NOTE — Progress Notes (Signed)
Inpatient Diabetes Program Recommendations  AACE/ADA: New Consensus Statement on Inpatient Glycemic Control (2015)  Target Ranges:  Prepandial:   less than 140 mg/dL      Peak postprandial:   less than 180 mg/dL (1-2 hours)      Critically ill patients:  140 - 180 mg/dL   Lab Results  Component Value Date   GLUCAP 362 (H) 08/18/2019   HGBA1C 14.9 (H) 08/03/2019    Review of Glycemic Control Results for KLAY, SOBOTKA (MRN [id number]) as of 08/18/2019 12:41  Ref. Range 08/17/2019 23:26 08/18/2019 03:40 08/18/2019 07:53 08/18/2019 11:37  Glucose-Capillary Latest Ref Range: 70 - 99 mg/dL 291 (H) 296 (H) 284 (H) 362 (H)   Diabetes history: Type 2 DM Outpatient Diabetes medications: novolog 8 units TID, Lantus 35 units QHS, Metformin 1000 mg BID Current orders for Inpatient glycemic control: Novolog 0-20 units Q4H, Novolog 6 units Q4H, Levemir 50 units BID Vital @ 70 ml/hr Solumedrol 80 mg QD Inpatient Diabetes Program Recommendations:    Consider: - Increasing Levemir to 60 units BID -Increasing tube feed coverage to Novolog 10 units Q4H  Thanks, Bronson Curb, MSN, RNC-OB Diabetes Coordinator [phone] (8a-5p)

## 2019-08-18 NOTE — Progress Notes (Signed)
PCCM INTERVAL PROGRESS NOTE   Called to bedside regarding tracheostomy issue.  Profound cuff leak identified. Respiratory therapy has tried to troubleshoot by adding more air to cuff and making sure trach was fully inserted. Despite this, a massive air leak remained and the patient was becoming progressively tachycardic and agitated.   I spoke with Dr. Constance Holster who recommended endotracheal intubation.     Georgann Housekeeper, AGACNP-BC [place]  See Amion for personal pager PCCM on call pager [phone]  08/18/2019 3:26 AM

## 2019-08-18 NOTE — Procedures (Signed)
Intubation Procedure Note  Timothy Le  [id number]  05/03/1960  Date:08/18/19  Time:3:29 AM   Provider Performing:Lalani Winkles Sharene Butters    Procedure: Intubation (31500)  Indication(s) Respiratory Failure  Consent Unable to obtain consent due to emergent nature of procedure.   Anesthesia Rocuronium and Propofol   Time Out Verified patient identification, verified procedure, site/side was marked, verified correct patient position, special equipment/implants available, medications/allergies/relevant history reviewed, required imaging and test results available.   Sterile Technique Usual hand hygeine, masks, and gloves were used   Procedure Description Patient positioned in bed supine.  Sedation given as noted above.  Patient was intubated with endotracheal tube using Glidescope.  View was Grade 1 full glottis .  Number of attempts was 1.  Colorimetric CO2 detector was consistent with tracheal placement.   Complications/Tolerance None; patient tolerated the procedure well. Chest X-ray is ordered to verify placement.   EBL Minimal   Specimen(s) None  Bennie Pierini, MD 08/18/19 3:30 AM

## 2019-08-18 NOTE — Progress Notes (Addendum)
NAME:  Timothy Le, MRN:  [id number], DOB:  Mar 14, 1960, LOS: 8 ADMISSION DATE:  07/30/2019, CONSULTATION DATE:  7/11 REFERRING MD:  Kathrynn Humble, CHIEF COMPLAINT:  Dyspnea   Brief History   59 year old male with baseline chronic respiratory failure on the basis of severe OHS/OSA complicated further by significant obstructive disease in the setting of COPD. Presents with acute on chronic hypoxic and hypercarbic respiratory failure. Admitted with the initial working diagnosis of pulmonary edema and decompensated diastolic heart failure. Per family patient was without CPAP for 2 weeks PTA as his machine broke. Has required mechanical ventilation for most of the hospitalization, extubated on 7/16, had stridor, unsuccessful re-intubation, had emergent tracheostomy which was difficult.  On 7/24 he became agitated and his tracheostomy dislodged, required emergent re-intubation.  Past Medical History  Past medical history of Asthma, Chronic respiratory failure with hypoxia ([place]) (07/09/2017), COPD (chronic obstructive pulmonary disease) ([place]), Diabetes mellitus without complication ([place]), Difficult intubation, Hypertension, Shortness of breath dyspnea, and Sleep apnea  Significant Hospital Events   On the night of 7/15-7/16 the patient had one event of desaturation to 70s, resolved with suctioning by RT and RN. Na increased from 147-152, free water flushes increased to 60 mL/hr. Febrile this morning to 100.7*F at 0830. Peep decreased to 8, saturations stable at 100%, FiO2 40%.  On 7/16, the patient was extubated but subsequently became stridorous. Attempted re-intubation was unsuccessful, even via FOB such that the patient had an #6 Shiley emergent trach placed by Trauma Surg. This was followed by atelectasis of the right lung, from which a blood clot was removed via FOB.  7/23 bronch mucus plug  7/24 period of lucidity around 0530, then severely agitated, tracheostomy dislodged, emergently  re-intubated  Consults:  Trauma Surg ENT  Procedures:  Endotracheal tube 7/11 Right IJ triple-lumen catheter 7/11 Trach 7/16 > 7/24 ETT 7/24 > 7/26 Trach 7/26>  Significant Diagnostic Tests:  Echocardiogram 7/11 >> no evidence of RV strain; LVEF 50-55%.Left ventricular diastolic function could not be evaluated.  Lower extremity ultrasound 7/11 >> no evidence of lower extremity DVT MRI brain 7/20 > normal MRI brain, extensive sinus mucosal disease, bilateral mastoid effusion, bilateral proptosis  Micro Data:  7/11 SARS COV 2> negative 7/13 resp culture > OPF 7/23 BAL LLL > staph epi   Antimicrobials:  Pip/tazo - 7/16>7/21  Cefepime 7/24 > 7/25  Ceftriaxone 7/24 > 7/27  Interim history/subjective:  Overnight  Patient found to have cuff leak, ETT place and trach removed.   Objective   Blood pressure 121/68, pulse 84, temperature 99.2 F (37.3 C), temperature source Oral, resp. rate 20, height 6' (1.829 m), weight (!) 145.6 kg, SpO2 96 %.    Vent Mode: PRVC FiO2 (%):  [40 %] 40 % Set Rate:  [15 bmp] 15 bmp Vt Set:  [470 mL] 470 mL PEEP:  [8 cmH20] 8 cmH20 Pressure Support:  [10 cmH20] 10 cmH20 Plateau Pressure:  [14 cmH20-23 cmH20] 19 cmH20   Intake/Output Summary (Last 24 hours) at 08/18/2019 0853 Last data filed at 08/18/2019 0600 Gross per 24 hour  Intake 2226.34 ml  Output 4150 ml  Net -1923.66 ml   Filed Weights   08/16/19 0500 08/17/19 0400 08/18/19 0300  Weight: (!) 158 kg (!) 148 kg (!) 145.6 kg    Examination: General:  In bed on vent, eyes open, chronically ill appearing  HENT: NCAT, tracheostomy site covered with gauze, ETT in place PULM: CTAB, vent supported breathing CV: RRR, no mgr GI: BS+, soft,  nontender MSK: normal bulk and tone, no LE edema Neuro: eyes open, sedated , not following commands  CXR personally reviewed: Increased Right lobe atelectasis   Noted labs: Glucose 313 Hgb 8.6 - stable K 4.2 , Mg 1.8 , Phos 3.2   ABG      Component Value Date/Time   PHART 7.480 (H) 08/18/2019 0450   PCO2ART 56.2 (H) 08/18/2019 0450   PO2ART 209 (H) 08/18/2019 0450   HCO3 41.7 (H) 08/18/2019 0450   TCO2 43 (H) 08/18/2019 0450   ACIDBASEDEF 1.0 07/30/2019 1724   O2SAT 100.0 08/18/2019 0450    Resolved Hospital Problem list   AKI  Assessment & Plan:  Acute hypoxic respiratory failure  OHS and OSA at baseline Prolonged mechanical ventilation now s/p #7 adjustable length tracheostomy placed 7/26. Lost trach overnight and emergent intubation successful.  Full mechanical vent support, VAP prevention Will rest patient today, and start to work toward weaning with pressure support trials 7/31. Propofol for sedation Oxycodone for moderate pain, PRN Dilaudid for Severe pain PAD protocol, RASS target 0 Decrease Solumedrol 80 mg q8hrs to once daily , steroid started 7/28 for exacerbation DuoNeb scheduled q6hrs F/U with ENT to discuss future goals after decannulation last night   DM2 with hyperglycemia Above hospital goal of < 180 since starting Solumedrol. Have titrated up Levimir from 40 to 50 units daily and increased tube feed coverage from 4 to 6 units daily. Will increase tube feed coverage to 10 units q 4 hrs. May need titration down with decreasing steroids. Tube feed coverage - 10 units Aspart q4hrs Bolus - SSI-R  Hypomagnesia - Mg 1.8 , replete Mg to goal of 2   HCAP left lower lobe: staph epi Stopped antibiotics , no objective signs of infection overnight. Will add CBC to morning labs Susceptibilities are multi-drug resistant, unlikely truly from BAL, most likely contaminant Will continue to monitor and if other objective signs of infection (leukocytosis, fever) can treat with doxycycline or linezolid per ID pharmacy  Hematuria F/U U/A in 4-6 weeks    Best practice:  Diet: Tube feeding Pain/Anxiety/Delirium protocol (if indicated): as above VAP protocol (if indicated): yes DVT prophylaxis: lovenox GI  prophylaxis: ppi Glucose control: as above Mobility: bed rest Code Status: full Family Communication: Updated his sister [name] by phone on 7/29 Disposition: remain in ICU  Labs   CBC: Recent Labs  Lab 08/12/19 0534 08/12/19 0534 08/13/19 0429 08/13/19 0429 08/14/19 0404 08/17/19 0418 08/17/19 0451 08/18/19 0400 08/18/19 0450  WBC 13.2*  --  9.5  --  9.9 12.2*  --  11.3*  --   NEUTROABS  --   --  6.3  --  5.7  --   --   --   --   HGB 9.5*   < > 8.5*   < > 8.4* 8.2* 9.5* 8.6* 8.8*  HCT 32.0*   < > 28.9*   < > 28.2* 27.7* 28.0* 29.1* 26.0*  MCV 93.8  --  94.1  --  95.3 92.0  --  94.8  --   PLT 270  --  209  --  199 234  --  266  --    < > = values in this interval not displayed.    Basic Metabolic Panel: Recent Labs  Lab 08/14/19 0404 08/14/19 0404 08/15/19 0443 08/15/19 0443 08/16/19 0852 08/17/19 0418 08/17/19 0451 08/18/19 0400 08/18/19 0450  NA 139   < > 141   < > 139 140 143 140 139  K 3.4*   < > 3.6   < > 3.0* 3.8 3.8 4.2 4.3  CL 102  --  103  --  92* 95*  --  97*  --   CO2 32  --  32  --  34* 38*  --  34*  --   GLUCOSE 96  --  163*  --  279* 309*  --  313*  --   BUN 44*  --  39*  --  35* 37*  --  33*  --   CREATININE 1.12  --  1.04  --  1.09 0.97  --  0.87  --   CALCIUM 8.1*  --  8.3*  --  9.0 8.6*  --  8.3*  --   MG  --   --   --   --   --  1.4*  --  1.8  --   PHOS  --   --   --   --   --  1.7*  --  3.2  --    < > = values in this interval not displayed.   GFR: Estimated Creatinine Clearance: 138.9 mL/min (by C-G formula based on SCr of 0.87 mg/dL). Recent Labs  Lab 08/13/19 0429 08/14/19 0404 08/17/19 0418 08/18/19 0400  WBC 9.5 9.9 12.2* 11.3*    Liver Function Tests: Recent Labs  Lab 08/13/19 0429  AST 66*  ALT 46*  ALKPHOS 116  BILITOT 0.7  PROT 7.6  ALBUMIN 1.7*   No results for input(s): LIPASE, AMYLASE in the last 168 hours. No results for input(s): AMMONIA in the last 168 hours.  ABG    Component Value Date/Time   PHART  7.480 (H) 08/18/2019 0450   PCO2ART 56.2 (H) 08/18/2019 0450   PO2ART 209 (H) 08/18/2019 0450   HCO3 41.7 (H) 08/18/2019 0450   TCO2 43 (H) 08/18/2019 0450   ACIDBASEDEF 1.0 07/30/2019 1724   O2SAT 100.0 08/18/2019 0450     Coagulation Profile: No results for input(s): INR, PROTIME in the last 168 hours.  Cardiac Enzymes: No results for input(s): CKTOTAL, CKMB, CKMBINDEX, TROPONINI in the last 168 hours.  HbA1C: Hgb A1c MFr Bld  Date/Time Value Ref Range Status  08/03/2019 01:22 PM 14.9 (H) 4.8 - 5.6 % Final    Comment:    (NOTE) Pre diabetes:          5.7%-6.4%  Diabetes:              >6.4%  Glycemic control for   <7.0% adults with diabetes   03/04/2019 07:08 AM 16.6 (H) 4.8 - 5.6 % Final    Comment:    (NOTE) Pre diabetes:          5.7%-6.4% Diabetes:              >6.4% Glycemic control for   <7.0% adults with diabetes     CBG: Recent Labs  Lab 08/17/19 1640 08/17/19 1959 08/17/19 2326 08/18/19 0340 08/18/19 [place], MD PGY2 Internal Medicine [phone]

## 2019-08-19 ENCOUNTER — Inpatient Hospital Stay (HOSPITAL_COMMUNITY): Payer: Medicaid Other

## 2019-08-19 LAB — POCT I-STAT 7, (LYTES, BLD GAS, ICA,H+H)
Acid-Base Excess: 15 mmol/L — ABNORMAL HIGH (ref 0.0–2.0)
Bicarbonate: 40.3 mmol/L — ABNORMAL HIGH (ref 20.0–28.0)
Calcium, Ion: 1.14 mmol/L — ABNORMAL LOW (ref 1.15–1.40)
HCT: 27 % — ABNORMAL LOW (ref 39.0–52.0)
Hemoglobin: 9.2 g/dL — ABNORMAL LOW (ref 13.0–17.0)
O2 Saturation: 95 %
Patient temperature: 98.2
Potassium: 3.2 mmol/L — ABNORMAL LOW (ref 3.5–5.1)
Sodium: 142 mmol/L (ref 135–145)
TCO2: 42 mmol/L — ABNORMAL HIGH (ref 22–32)
pCO2 arterial: 51.8 mmHg — ABNORMAL HIGH (ref 32.0–48.0)
pH, Arterial: 7.498 — ABNORMAL HIGH (ref 7.350–7.450)
pO2, Arterial: 69 mmHg — ABNORMAL LOW (ref 83.0–108.0)

## 2019-08-19 LAB — CBC
HCT: 28.7 % — ABNORMAL LOW (ref 39.0–52.0)
Hemoglobin: 8.4 g/dL — ABNORMAL LOW (ref 13.0–17.0)
MCH: 27.8 pg (ref 26.0–34.0)
MCHC: 29.3 g/dL — ABNORMAL LOW (ref 30.0–36.0)
MCV: 95 fL (ref 80.0–100.0)
Platelets: 248 10*3/uL (ref 150–400)
RBC: 3.02 MIL/uL — ABNORMAL LOW (ref 4.22–5.81)
RDW: 16.9 % — ABNORMAL HIGH (ref 11.5–15.5)
WBC: 8.8 10*3/uL (ref 4.0–10.5)
nRBC: 0.6 % — ABNORMAL HIGH (ref 0.0–0.2)

## 2019-08-19 LAB — BASIC METABOLIC PANEL
Anion gap: 8 (ref 5–15)
BUN: 44 mg/dL — ABNORMAL HIGH (ref 6–20)
CO2: 37 mmol/L — ABNORMAL HIGH (ref 22–32)
Calcium: 8.6 mg/dL — ABNORMAL LOW (ref 8.9–10.3)
Chloride: 97 mmol/L — ABNORMAL LOW (ref 98–111)
Creatinine, Ser: 0.94 mg/dL (ref 0.61–1.24)
GFR calc Af Amer: >60 mL/min (ref >60)
GFR calc non Af Amer: >60 mL/min (ref >60)
Glucose, Bld: 181 mg/dL — ABNORMAL HIGH (ref 70–99)
Potassium: 3 mmol/L — ABNORMAL LOW (ref 3.5–5.1)
Sodium: 142 mmol/L (ref 135–145)

## 2019-08-19 LAB — PHOSPHORUS: Phosphorus: 2.7 mg/dL (ref 2.5–4.6)

## 2019-08-19 LAB — GLUCOSE, CAPILLARY
Glucose-Capillary: 163 mg/dL — ABNORMAL HIGH (ref 70–99)
Glucose-Capillary: 170 mg/dL — ABNORMAL HIGH (ref 70–99)
Glucose-Capillary: 184 mg/dL — ABNORMAL HIGH (ref 70–99)
Glucose-Capillary: 200 mg/dL — ABNORMAL HIGH (ref 70–99)
Glucose-Capillary: 218 mg/dL — ABNORMAL HIGH (ref 70–99)
Glucose-Capillary: 249 mg/dL — ABNORMAL HIGH (ref 70–99)

## 2019-08-19 LAB — MAGNESIUM: Magnesium: 2.1 mg/dL (ref 1.7–2.4)

## 2019-08-19 MED ORDER — METHYLPREDNISOLONE SODIUM SUCC 125 MG IJ SOLR
60.0000 mg | Freq: Every day | INTRAMUSCULAR | Status: DC
  Administered 2019-08-20 – 2019-08-22 (×3): 60 mg via INTRAVENOUS
  Filled 2019-08-19 (×4): qty 2

## 2019-08-19 MED ORDER — POTASSIUM CHLORIDE 20 MEQ/15ML (10%) PO SOLN
20.0000 meq | ORAL | Status: AC
  Administered 2019-08-19 (×2): 20 meq
  Filled 2019-08-19 (×2): qty 15

## 2019-08-19 NOTE — Progress Notes (Signed)
NAME:  Timothy Le, MRN:  [id number], DOB:  12-15-1960, LOS: 7 ADMISSION DATE:  07/30/2019, CONSULTATION DATE:  07/30/2019 REFERRING MD:  Kathrynn Humble, CHIEF COMPLAINT:  Dyspnea   Brief History   59 year old male with baseline chronic respiratory failure on the basis of severe OHS/OSA complicated further by significant obstructive disease in the setting of COPD. Presents with acute on chronic hypoxic and hypercarbic respiratory failure. Admitted with the initial working diagnosis of pulmonary edema and decompensated diastolic heart failure. Per family patient was without CPAP for 2 weeks PTA as his machine broke. Has required mechanical ventilation for most of the hospitalization, extubated on 7/16, had stridor, unsuccessful re-intubation, had emergent tracheostomy which was difficult.  On 7/24 he became agitated and his tracheostomy dislodged, required emergent re-intubation. Past Medical History  Past medical history of Asthma, Chronic respiratory failure with hypoxia ([place]) (07/09/2017), COPD (chronic obstructive pulmonary disease) ([place]), Diabetes mellitus without complication ([place]), Difficult intubation, Hypertension, Shortness of breath dyspnea, and Sleep apnea Significant Hospital Events   On the night of 7/15-7/16 the patient had one event of desaturation to 70s, resolved with suctioning by RT and RN. Na increased from 147-152, free water flushes increased to 60 mL/hr. Febrile this morning to 100.7*F at 0830. Peep decreased to 8, saturations stable at 100%, FiO2 40%.  On 7/16, the patient was extubated but subsequently became stridorous. Attempted re-intubation was unsuccessful, even via FOB such that the patient had an #6 Shiley emergent trach placed by Trauma Surg. This was followed by atelectasis of the right lung, from which a blood clot was removed via FOB.  7/23 bronch mucus plug  7/24 period of lucidity around 0530, then severely agitated, tracheostomy dislodged, emergently  re-intubated Consults:  Trauma surg ENT  Procedures:  Endotracheal tube 7/11 Right IJ triple-lumen catheter 7/11 Trach 7/16 > 7/24 ETT 7/24 > 7/26 Trach 7/26> Significant Diagnostic Tests:  Echocardiogram 7/11 >> no evidence of RV strain; LVEF 50-55%.Left ventricular diastolic function could not be evaluated. Lower extremity ultrasound 7/11 >> no evidence of lower extremity DVT MRI brain 7/20 > normal MRI brain, extensive sinus mucosal disease, bilateral mastoid effusion, bilateral proptosis  Micro Data:  7/11 SARS COV 2> negative 7/13 resp culture > OPF 7/23 BAL LLL > staph epi   Antimicrobials:  Pip/tazo - 7/16>7/21 Cefepime 7/24 > 7/25 Ceftriaxone 7/24 > 7/27   Interim history/subjective:  RN reports that patient is less alert today than a few days ago but becomes agitated with propofol < 40 mcg  Objective   Blood pressure (!) 113/62, pulse 61, temperature 98.5 F (36.9 C), temperature source Oral, resp. rate 15, height 6' (1.829 m), weight (!) 146.6 kg, SpO2 96 %.    Vent Mode: PSV;CPAP FiO2 (%):  [30 %-40 %] 30 % Set Rate:  [15 bmp] 15 bmp Vt Set:  [470 mL] 470 mL PEEP:  [8 cmH20] 8 cmH20 Pressure Support:  [14 cmH20] 14 cmH20 Plateau Pressure:  [18 cmH20-21 cmH20] 19 cmH20   Intake/Output Summary (Last 24 hours) at 08/19/2019 1021 Last data filed at 08/19/2019 0600 Gross per 24 hour  Intake 2525.12 ml  Output 2425 ml  Net 100.12 ml   Filed Weights   08/17/19 0400 08/18/19 0300 08/19/19 0400  Weight: (!) 148 kg (!) 145.6 kg (!) 146.6 kg    Examination: General: NARD HENT: [place]/AT. Blinks with visual threat. ETT in situ Lungs: Slightly decreased BS over L flank but otherwise clear bilat Cardiovascular: RRR w/o m/r/g Abdomen: No guarding; +BS Extremities:  No c/c/e Neuro: Withdraws limbs to noxious stim  PCXR today with segmental atelectasis L base  Assessment & Plan:  Acute hypoxic respiratory failure  OHS and OSA at baseline -place on PSV today as  tol -DuoNeb scheduled q6hrs -Decrease SoluMedrol to 60 mg qd  DM2 with hyperglycemia Glucoses lower today (170-182) Will readjust insulin requirements as decrease systemic steroids  Hypomagnesemia Corrected  HCAP/VAP WBC nl today at 8.8  Off Abx  Best practice:  Diet: Vital TF Pain/Anxiety/Delirium protocol (if indicated): Per protocol VAP protocol (if indicated): Yes DVT prophylaxis: Lovenox GI prophylaxis: PPI Glucose control: Levemir + Novolog SSI Mobility: BR Code Status: Full Family Communication: To update Disposition: ICU  Labs   CBC: Recent Labs  Lab 08/13/19 0429 08/13/19 0429 08/14/19 0404 08/14/19 0404 08/17/19 0418 08/17/19 0418 08/17/19 0451 08/18/19 0400 08/18/19 0450 08/19/19 0400 08/19/19 0411  WBC 9.5  --  9.9  --  12.2*  --   --  11.3*  --  8.8  --   NEUTROABS 6.3  --  5.7  --   --   --   --   --   --   --   --   HGB 8.5*   < > 8.4*   < > 8.2*   < > 9.5* 8.6* 8.8* 8.4* 9.2*  HCT 28.9*   < > 28.2*   < > 27.7*   < > 28.0* 29.1* 26.0* 28.7* 27.0*  MCV 94.1  --  95.3  --  92.0  --   --  94.8  --  95.0  --   PLT 209  --  199  --  234  --   --  266  --  248  --    < > = values in this interval not displayed.    Basic Metabolic Panel: Recent Labs  Lab 08/15/19 0443 08/15/19 0443 08/16/19 0852 08/16/19 0852 08/17/19 0418 08/17/19 0418 08/17/19 0451 08/18/19 0400 08/18/19 0450 08/19/19 0400 08/19/19 0411  NA 141   < > 139   < > 140   < > 143 140 139 142 142  K 3.6   < > 3.0*   < > 3.8   < > 3.8 4.2 4.3 3.0* 3.2*  CL 103  --  92*  --  95*  --   --  97*  --  97*  --   CO2 32  --  34*  --  38*  --   --  34*  --  37*  --   GLUCOSE 163*  --  279*  --  309*  --   --  313*  --  181*  --   BUN 39*  --  35*  --  37*  --   --  33*  --  44*  --   CREATININE 1.04  --  1.09  --  0.97  --   --  0.87  --  0.94  --   CALCIUM 8.3*  --  9.0  --  8.6*  --   --  8.3*  --  8.6*  --   MG  --   --   --   --  1.4*  --   --  1.8  --  2.1  --   PHOS  --   --    --   --  1.7*  --   --  3.2  --  2.7  --    < > = values  in this interval not displayed.   GFR: Estimated Creatinine Clearance: 129 mL/min (by C-G formula based on SCr of 0.94 mg/dL). Recent Labs  Lab 08/14/19 0404 08/17/19 0418 08/18/19 0400 08/19/19 0400  WBC 9.9 12.2* 11.3* 8.8    Liver Function Tests: Recent Labs  Lab 08/13/19 0429  AST 66*  ALT 46*  ALKPHOS 116  BILITOT 0.7  PROT 7.6  ALBUMIN 1.7*   No results for input(s): LIPASE, AMYLASE in the last 168 hours. No results for input(s): AMMONIA in the last 168 hours.  ABG    Component Value Date/Time   PHART 7.498 (H) 08/19/2019 0411   PCO2ART 51.8 (H) 08/19/2019 0411   PO2ART 69 (L) 08/19/2019 0411   HCO3 40.3 (H) 08/19/2019 0411   TCO2 42 (H) 08/19/2019 0411   ACIDBASEDEF 1.0 07/30/2019 1724   O2SAT 95.0 08/19/2019 0411     Coagulation Profile: No results for input(s): INR, PROTIME in the last 168 hours.  Cardiac Enzymes: No results for input(s): CKTOTAL, CKMB, CKMBINDEX, TROPONINI in the last 168 hours.  HbA1C: Hgb A1c MFr Bld  Date/Time Value Ref Range Status  08/03/2019 01:22 PM 14.9 (H) 4.8 - 5.6 % Final    Comment:    (NOTE) Pre diabetes:          5.7%-6.4%  Diabetes:              >6.4%  Glycemic control for   <7.0% adults with diabetes   03/04/2019 07:08 AM 16.6 (H) 4.8 - 5.6 % Final    Comment:    (NOTE) Pre diabetes:          5.7%-6.4% Diabetes:              >6.4% Glycemic control for   <7.0% adults with diabetes     CBG: Recent Labs  Lab 08/18/19 1619 08/18/19 1951 08/18/19 2315 08/19/19 0334 08/19/19 0742  GLUCAP 298* 229* 212* 170* 184*    Past Medical History  He,  has a past medical history of Asthma, Chronic respiratory failure with hypoxia ([place]) (07/09/2017), COPD (chronic obstructive pulmonary disease) ([place]), Diabetes mellitus without complication ([place]), Difficult intubation, Hypertension, Shortness of breath dyspnea, and Sleep apnea.   Surgical History    Past  Surgical History:  Procedure Laterality Date  . IR GASTROSTOMY TUBE MOD SED  07/05/2017  . TRACHEOSTOMY TUBE PLACEMENT N/A 06/17/2017   Procedure: TRACHEOSTOMY;  Surgeon: Leta Baptist, MD;  Location: [facility];  Service: ENT;  Laterality: N/A;  . TRACHEOSTOMY TUBE PLACEMENT N/A 08/14/2019   Procedure: TRACHEOSTOMY;  Surgeon: Izora Gala, MD;  Location: [facility];  Service: ENT;  Laterality: N/A;     Social History   reports that he quit smoking about 6 years ago. His smoking use included cigarettes. He started smoking about 36 years ago. He has a 0.25 pack-year smoking history. He has never used smokeless tobacco. He reports that he does not drink alcohol and does not use drugs.   Family History   His family history includes Asthma in his sister and another family member; Diabetes type II in his sister; Hypertension in his sister.   Allergies No Known Allergies   Home Medications  Prior to Admission medications   Medication Sig Start Date End Date Taking? Authorizing Provider  allopurinol (ZYLOPRIM) 100 MG tablet Take 100 mg by mouth daily.   Yes [provider]  atorvastatin (LIPITOR) 10 MG tablet Take 10 mg by mouth daily.   Yes [provider]  budesonide-formoterol (SYMBICORT)  160-4.5 MCG/ACT inhaler Inhale 2 puffs then rinse mouth, twice daiy Patient taking differently: Inhale 2 puffs into the lungs 2 (two) times daily. Rinse mouth after use 01/24/19  Yes Young, Clinton D, MD  desonide (DESOWEN) 0.05 % lotion Apply 1 application topically daily. Apply to face   Yes [provider]  insulin aspart (NOVOLOG FLEXPEN) 100 UNIT/ML FlexPen Inject 8 Units into the skin 3 (three) times daily with meals. 03/06/19  Yes Marianna Payment, MD  insulin glargine (LANTUS) 100 unit/mL SOPN Inject 0.35 mLs (35 Units total) into the skin at bedtime. 03/06/19 09/07/19 Yes Marianna Payment, MD  ketoconazole (NIZORAL) 2 % cream Apply 1 application topically daily. Apply to wounds   Yes [provider]  metFORMIN (GLUCOPHAGE) 1000 MG tablet Take 1 tablet (1,000 mg total) by mouth 2 (two) times daily with a meal. 03/06/19 03/05/20 Yes Chundi, Vahini, MD  metolazone (ZAROXOLYN) 2.5 MG tablet Take 2.5 mg by mouth daily.   Yes [provider]  potassium chloride SA (K-DUR,KLOR-CON) 20 MEQ tablet Take 1 tablet (20 mEq total) by mouth daily. 07/29/17  Yes Angiulli, Lavon Paganini, PA-C  sertraline (ZOLOFT) 100 MG tablet Take 100 mg by mouth daily.    Yes [provider]  torsemide (DEMADEX) 5 MG tablet Take 5 mg by mouth daily. 01/17/19  Yes [provider]  Insulin Pen Needle (BD ULTRA-FINE PEN NEEDLES) 29G X 12.7MM MISC USE AS DIRECTED WITH LANTUS 12/23/17   [provider]     Critical care time: 40 min

## 2019-08-20 DIAGNOSIS — Z978 Presence of other specified devices: Secondary | ICD-10-CM

## 2019-08-20 LAB — GLUCOSE, CAPILLARY
Glucose-Capillary: 140 mg/dL — ABNORMAL HIGH (ref 70–99)
Glucose-Capillary: 145 mg/dL — ABNORMAL HIGH (ref 70–99)
Glucose-Capillary: 154 mg/dL — ABNORMAL HIGH (ref 70–99)
Glucose-Capillary: 180 mg/dL — ABNORMAL HIGH (ref 70–99)
Glucose-Capillary: 188 mg/dL — ABNORMAL HIGH (ref 70–99)
Glucose-Capillary: 215 mg/dL — ABNORMAL HIGH (ref 70–99)

## 2019-08-20 LAB — BASIC METABOLIC PANEL
Anion gap: 9 (ref 5–15)
BUN: 40 mg/dL — ABNORMAL HIGH (ref 6–20)
CO2: 34 mmol/L — ABNORMAL HIGH (ref 22–32)
Calcium: 8.4 mg/dL — ABNORMAL LOW (ref 8.9–10.3)
Chloride: 97 mmol/L — ABNORMAL LOW (ref 98–111)
Creatinine, Ser: 1.01 mg/dL (ref 0.61–1.24)
GFR calc Af Amer: >60 mL/min (ref >60)
GFR calc non Af Amer: >60 mL/min (ref >60)
Glucose, Bld: 164 mg/dL — ABNORMAL HIGH (ref 70–99)
Potassium: 3.3 mmol/L — ABNORMAL LOW (ref 3.5–5.1)
Sodium: 140 mmol/L (ref 135–145)

## 2019-08-20 MED ORDER — INSULIN DETEMIR 100 UNIT/ML ~~LOC~~ SOLN
55.0000 [IU] | Freq: Two times a day (BID) | SUBCUTANEOUS | Status: DC
  Administered 2019-08-20 – 2019-08-22 (×5): 55 [IU] via SUBCUTANEOUS
  Filled 2019-08-20 (×7): qty 0.55

## 2019-08-20 MED ORDER — POTASSIUM CHLORIDE 20 MEQ/15ML (10%) PO SOLN
20.0000 meq | Freq: Two times a day (BID) | ORAL | Status: AC
  Administered 2019-08-20 (×2): 20 meq via ORAL
  Filled 2019-08-20 (×2): qty 15

## 2019-08-20 MED ORDER — POTASSIUM CHLORIDE 20 MEQ/15ML (10%) PO SOLN
20.0000 meq | ORAL | Status: AC
  Administered 2019-08-20 (×2): 20 meq
  Filled 2019-08-20 (×2): qty 15

## 2019-08-20 NOTE — Progress Notes (Signed)
NAME:  Timothy Le, MRN:  [id number], DOB:  12-15-1960, LOS: 21 ADMISSION DATE:  07/30/2019, CONSULTATION DATE:  07/30/2019 REFERRING MD:  Tammy Sours, CHIEF COMPLAINT:  Dyspnea   Brief History   59 year old male with baseline chronic respiratory failure on the basis of severe OHS/OSA complicated further by significant obstructive disease in the setting of COPD. Presents with acute on chronic hypoxic and hypercarbic respiratory failure. Admitted with the initial working diagnosis of pulmonary edema and decompensated diastolic heart failure. Per family patient was without CPAP for 2 weeks PTA as his machine broke. Has required mechanical ventilation for most of the hospitalization, extubated on 7/16, had stridor, unsuccessful re-intubation, had emergent tracheostomy which was difficult. On 7/24 he became agitated and his tracheostomy dislodged, required emergent re-intubation. Past Medical History  Past medical history of Asthma, Chronic respiratory failure with hypoxia ([place]) (07/09/2017), COPD (chronic obstructive pulmonary disease) ([place]), Diabetes mellitus without complication ([place]), Difficult intubation, Hypertension, Shortness of breath dyspnea, and Sleep apnea Significant Hospital Events   On the night of 7/15-7/16 the patient had one event of desaturation to 70s, resolved with suctioning by RT and RN. Na increased from 147-152, free water flushes increased to 60 mL/hr. Febrile this morning to 100.7*F at 0830. Peep decreased to 8, saturations stable at 100%, FiO2 40%.  On 7/16, the patient was extubated but subsequently became stridorous. Attempted re-intubation was unsuccessful, even via FOB such that the patient had an #6 Shiley emergent trach placed by Trauma Surg. This was followed by atelectasis of the right lung, from which a blood clot was removed via FOB.  7/23 bronch mucus plug  7/24 period of lucidity around 0530, then severely agitated, tracheostomy dislodged, emergently  re-intubated Consults:  Trauma surg ENT Procedures:  Endotracheal tube 7/11 Right IJ triple-lumen catheter 7/11 Trach 7/16 > 7/24 ETT 7/24 > 7/26 Trach 7/26> ETT 7/30> Significant Diagnostic Tests:  Echocardiogram 7/11 >> no evidence of RV strain; LVEF 50-55%.Left ventricular diastolic function could not be evaluated. Lower extremity ultrasound 7/11 >> no evidence of lower extremity DVT MRI brain 7/20 > normal MRI brain, extensive sinus mucosal disease, bilateral mastoid effusion, bilateral proptosis Micro Data:  7/11 SARS COV 2> negative 7/13 resp culture > OPF 7/23 BAL LLL >staph epi   Antimicrobials:  Pip/tazo - 7/16>7/21 Cefepime 7/24 > 7/25 Ceftriaxone 7/24 > 7/27    Interim history/subjective:  RN reports no issues overnight  Objective   Blood pressure (!) 128/63, pulse 71, temperature 98.7 F (37.1 C), temperature source Oral, resp. rate (!) 26, height 6' (1.829 m), weight (!) 146.8 kg, SpO2 100 %.    Vent Mode: PRVC FiO2 (%):  [30 %] 30 % Set Rate:  [15 bmp] 15 bmp Vt Set:  [470 mL] 470 mL PEEP:  [8 cmH20] 8 cmH20 Pressure Support:  [14 cmH20] 14 cmH20 Plateau Pressure:  [17 cmH20-34 cmH20] 34 cmH20   Intake/Output Summary (Last 24 hours) at 08/20/2019 1406 Last data filed at 08/20/2019 1140 Gross per 24 hour  Intake 3600.69 ml  Output 1875 ml  Net 1725.69 ml   Filed Weights   08/18/19 0300 08/19/19 0400 08/20/19 0400  Weight: (!) 145.6 kg (!) 146.6 kg (!) 146.8 kg    Examination: General: NARD HENT: [place]/AT PRRL ETT in place Lungs: Scattered rhonchi bilaterally Cardiovascular: RRR, no m/r/g Abdomen: Supple, no guarding Extremities: No c/c/e Neuro: Blinks on request GU: Foley  Assessment & Plan:  Acute hypoxic respiratory failure  OHS and OSA at baseline -place on PSV today  as tol -DuoNeb scheduled q6hrs -Continue SoluMedrol at 60 mg qd x 5d  DM2 with hyperglycemia Glucoses still ~200 Increase Levemir  Hypokalemia K+ today 3.3 Will  supplement via TF  Best practice:  Diet: Vital TF Pain/Anxiety/Delirium protocol (if indicated): Per protocol VAP protocol (if indicated): Yes DVT prophylaxis: Lovenox GI prophylaxis: PPI Glucose control: Levemir + SSI Mobility: BR Code Status: Full Family Communication: To update Disposition: ICU  Labs   CBC: Recent Labs  Lab 08/14/19 0404 08/14/19 0404 08/17/19 0418 08/17/19 0418 08/17/19 0451 08/18/19 0400 08/18/19 0450 08/19/19 0400 08/19/19 0411  WBC 9.9  --  12.2*  --   --  11.3*  --  8.8  --   NEUTROABS 5.7  --   --   --   --   --   --   --   --   HGB 8.4*   < > 8.2*   < > 9.5* 8.6* 8.8* 8.4* 9.2*  HCT 28.2*   < > 27.7*   < > 28.0* 29.1* 26.0* 28.7* 27.0*  MCV 95.3  --  92.0  --   --  94.8  --  95.0  --   PLT 199  --  234  --   --  266  --  248  --    < > = values in this interval not displayed.    Basic Metabolic Panel: Recent Labs  Lab 08/16/19 0852 08/16/19 0852 08/17/19 0418 08/17/19 0451 08/18/19 0400 08/18/19 0450 08/19/19 0400 08/19/19 0411 08/20/19 0420  NA 139   < > 140   < > 140 139 142 142 140  K 3.0*   < > 3.8   < > 4.2 4.3 3.0* 3.2* 3.3*  CL 92*  --  95*  --  97*  --  97*  --  97*  CO2 34*  --  38*  --  34*  --  37*  --  34*  GLUCOSE 279*  --  309*  --  313*  --  181*  --  164*  BUN 35*  --  37*  --  33*  --  44*  --  40*  CREATININE 1.09  --  0.97  --  0.87  --  0.94  --  1.01  CALCIUM 9.0  --  8.6*  --  8.3*  --  8.6*  --  8.4*  MG  --   --  1.4*  --  1.8  --  2.1  --   --   PHOS  --   --  1.7*  --  3.2  --  2.7  --   --    < > = values in this interval not displayed.   GFR: Estimated Creatinine Clearance: 120.2 mL/min (by C-G formula based on SCr of 1.01 mg/dL). Recent Labs  Lab 08/14/19 0404 08/17/19 0418 08/18/19 0400 08/19/19 0400  WBC 9.9 12.2* 11.3* 8.8    Liver Function Tests: No results for input(s): AST, ALT, ALKPHOS, BILITOT, PROT, ALBUMIN in the last 168 hours. No results for input(s): LIPASE, AMYLASE in the  last 168 hours. No results for input(s): AMMONIA in the last 168 hours.  ABG    Component Value Date/Time   PHART 7.498 (H) 08/19/2019 0411   PCO2ART 51.8 (H) 08/19/2019 0411   PO2ART 69 (L) 08/19/2019 0411   HCO3 40.3 (H) 08/19/2019 0411   TCO2 42 (H) 08/19/2019 0411   ACIDBASEDEF 1.0 07/30/2019 1724   O2SAT 95.0 08/19/2019 0411  Coagulation Profile: No results for input(s): INR, PROTIME in the last 168 hours.  Cardiac Enzymes: No results for input(s): CKTOTAL, CKMB, CKMBINDEX, TROPONINI in the last 168 hours.  HbA1C: Hgb A1c MFr Bld  Date/Time Value Ref Range Status  08/03/2019 01:22 PM 14.9 (H) 4.8 - 5.6 % Final    Comment:    (NOTE) Pre diabetes:          5.7%-6.4%  Diabetes:              >6.4%  Glycemic control for   <7.0% adults with diabetes   03/04/2019 07:08 AM 16.6 (H) 4.8 - 5.6 % Final    Comment:    (NOTE) Pre diabetes:          5.7%-6.4% Diabetes:              >6.4% Glycemic control for   <7.0% adults with diabetes     CBG: Recent Labs  Lab 08/19/19 1911 08/19/19 2344 08/20/19 0351 08/20/19 0741 08/20/19 1118  GLUCAP 218* 163* 140* 188* 215*    Past Medical History  He,  has a past medical history of Asthma, Chronic respiratory failure with hypoxia ([place]) (07/09/2017), COPD (chronic obstructive pulmonary disease) ([place]), Diabetes mellitus without complication ([place]), Difficult intubation, Hypertension, Shortness of breath dyspnea, and Sleep apnea.   Surgical History    Past Surgical History:  Procedure Laterality Date  . IR GASTROSTOMY TUBE MOD SED  07/05/2017  . TRACHEOSTOMY TUBE PLACEMENT N/A 06/17/2017   Procedure: TRACHEOSTOMY;  Surgeon: Leta Baptist, MD;  Location: [facility];  Service: ENT;  Laterality: N/A;  . TRACHEOSTOMY TUBE PLACEMENT N/A 08/14/2019   Procedure: TRACHEOSTOMY;  Surgeon: Izora Gala, MD;  Location: [facility];  Service: ENT;  Laterality: N/A;     Social History   reports that he quit smoking about 6 years ago. His smoking use  included cigarettes. He started smoking about 36 years ago. He has a 0.25 pack-year smoking history. He has never used smokeless tobacco. He reports that he does not drink alcohol and does not use drugs.   Family History   His family history includes Asthma in his sister and another family member; Diabetes type II in his sister; Hypertension in his sister.   Allergies No Known Allergies   Home Medications  Prior to Admission medications   Medication Sig Start Date End Date Taking? Authorizing Provider  allopurinol (ZYLOPRIM) 100 MG tablet Take 100 mg by mouth daily.   Yes [provider]  atorvastatin (LIPITOR) 10 MG tablet Take 10 mg by mouth daily.   Yes [provider]  budesonide-formoterol (SYMBICORT) 160-4.5 MCG/ACT inhaler Inhale 2 puffs then rinse mouth, twice daiy Patient taking differently: Inhale 2 puffs into the lungs 2 (two) times daily. Rinse mouth after use 01/24/19  Yes Young, Clinton D, MD  desonide (DESOWEN) 0.05 % lotion Apply 1 application topically daily. Apply to face   Yes [provider]  insulin aspart (NOVOLOG FLEXPEN) 100 UNIT/ML FlexPen Inject 8 Units into the skin 3 (three) times daily with meals. 03/06/19  Yes Marianna Payment, MD  insulin glargine (LANTUS) 100 unit/mL SOPN Inject 0.35 mLs (35 Units total) into the skin at bedtime. 03/06/19 09/07/19 Yes Marianna Payment, MD  ketoconazole (NIZORAL) 2 % cream Apply 1 application topically daily. Apply to wounds   Yes [provider]  metFORMIN (GLUCOPHAGE) 1000 MG tablet Take 1 tablet (1,000 mg total) by mouth 2 (two) times daily with a meal. 03/06/19 03/05/20 Yes Lars Mage, MD  metolazone (ZAROXOLYN) 2.5 MG tablet Take 2.5 mg by mouth daily.   Yes [provider]  potassium chloride SA (K-DUR,KLOR-CON) 20 MEQ tablet Take 1 tablet (20 mEq total) by mouth daily. 07/29/17  Yes Angiulli, Lavon Paganini, PA-C  sertraline (ZOLOFT) 100 MG tablet Take 100 mg by mouth daily.    Yes [provider]  torsemide (DEMADEX) 5 MG tablet Take 5 mg by mouth daily. 01/17/19  Yes [provider]  Insulin Pen Needle (BD ULTRA-FINE PEN NEEDLES) 29G X 12.7MM MISC USE AS DIRECTED WITH LANTUS 12/23/17   [provider]     Critical care time: 35 min    Jamesetta So, M.D.

## 2019-08-21 DIAGNOSIS — G9340 Encephalopathy, unspecified: Secondary | ICD-10-CM

## 2019-08-21 DIAGNOSIS — Z9911 Dependence on respirator [ventilator] status: Secondary | ICD-10-CM

## 2019-08-21 DIAGNOSIS — E662 Morbid (severe) obesity with alveolar hypoventilation: Secondary | ICD-10-CM

## 2019-08-21 DIAGNOSIS — G4733 Obstructive sleep apnea (adult) (pediatric): Secondary | ICD-10-CM

## 2019-08-21 LAB — CBC WITH DIFFERENTIAL/PLATELET
Abs Immature Granulocytes: 0.14 10*3/uL — ABNORMAL HIGH (ref 0.00–0.07)
Basophils Absolute: 0 10*3/uL (ref 0.0–0.1)
Basophils Relative: 1 %
Eosinophils Absolute: 0.2 10*3/uL (ref 0.0–0.5)
Eosinophils Relative: 3 %
HCT: 30.5 % — ABNORMAL LOW (ref 39.0–52.0)
Hemoglobin: 9 g/dL — ABNORMAL LOW (ref 13.0–17.0)
Immature Granulocytes: 2 %
Lymphocytes Relative: 28 %
Lymphs Abs: 2.3 10*3/uL (ref 0.7–4.0)
MCH: 27.8 pg (ref 26.0–34.0)
MCHC: 29.5 g/dL — ABNORMAL LOW (ref 30.0–36.0)
MCV: 94.1 fL (ref 80.0–100.0)
Monocytes Absolute: 0.5 10*3/uL (ref 0.1–1.0)
Monocytes Relative: 6 %
Neutro Abs: 5 10*3/uL (ref 1.7–7.7)
Neutrophils Relative %: 60 %
Platelets: 258 10*3/uL (ref 150–400)
RBC: 3.24 MIL/uL — ABNORMAL LOW (ref 4.22–5.81)
RDW: 16.6 % — ABNORMAL HIGH (ref 11.5–15.5)
WBC: 8.2 10*3/uL (ref 4.0–10.5)
nRBC: 2.4 % — ABNORMAL HIGH (ref 0.0–0.2)

## 2019-08-21 LAB — GLUCOSE, CAPILLARY
Glucose-Capillary: 155 mg/dL — ABNORMAL HIGH (ref 70–99)
Glucose-Capillary: 139 mg/dL — ABNORMAL HIGH (ref 70–99)
Glucose-Capillary: 112 mg/dL — ABNORMAL HIGH (ref 70–99)
Glucose-Capillary: 148 mg/dL — ABNORMAL HIGH (ref 70–99)
Glucose-Capillary: 160 mg/dL — ABNORMAL HIGH (ref 70–99)
Glucose-Capillary: 90 mg/dL (ref 70–99)

## 2019-08-21 LAB — BASIC METABOLIC PANEL
Anion gap: 7 (ref 5–15)
BUN: 40 mg/dL — ABNORMAL HIGH (ref 6–20)
CO2: 33 mmol/L — ABNORMAL HIGH (ref 22–32)
Calcium: 8.3 mg/dL — ABNORMAL LOW (ref 8.9–10.3)
Chloride: 99 mmol/L (ref 98–111)
Creatinine, Ser: 0.81 mg/dL (ref 0.61–1.24)
GFR calc Af Amer: >60 mL/min (ref >60)
GFR calc non Af Amer: >60 mL/min (ref >60)
Glucose, Bld: 162 mg/dL — ABNORMAL HIGH (ref 70–99)
Potassium: 3.3 mmol/L — ABNORMAL LOW (ref 3.5–5.1)
Sodium: 139 mmol/L (ref 135–145)

## 2019-08-21 LAB — MAGNESIUM: Magnesium: 1.7 mg/dL (ref 1.7–2.4)

## 2019-08-21 MED ORDER — ATROPINE SULFATE 1 MG/10ML IJ SOSY
0.5000 mg | PREFILLED_SYRINGE | Freq: Once | INTRAMUSCULAR | Status: AC
  Administered 2019-08-21: 0.5 mg via INTRAVENOUS

## 2019-08-21 MED ORDER — PROPOFOL 1000 MG/100ML IV EMUL
0.0000 ug/kg/min | INTRAVENOUS | Status: DC
  Administered 2019-08-21: 40 ug/kg/min via INTRAVENOUS
  Administered 2019-08-21: 5 ug/kg/min via INTRAVENOUS
  Administered 2019-08-22 (×4): 50 ug/kg/min via INTRAVENOUS
  Administered 2019-08-22: 40 ug/kg/min via INTRAVENOUS
  Administered 2019-08-22: 50 ug/kg/min via INTRAVENOUS
  Administered 2019-08-22: 25 ug/kg/min via INTRAVENOUS
  Administered 2019-08-22: 50 ug/kg/min via INTRAVENOUS
  Administered 2019-08-23: 30 ug/kg/min via INTRAVENOUS
  Administered 2019-08-23: 50 ug/kg/min via INTRAVENOUS
  Administered 2019-08-23: 40 ug/kg/min via INTRAVENOUS
  Administered 2019-08-23 – 2019-08-24 (×10): 50 ug/kg/min via INTRAVENOUS
  Filled 2019-08-21 (×5): qty 100
  Filled 2019-08-21: qty 200
  Filled 2019-08-21: qty 100
  Filled 2019-08-21 (×2): qty 200
  Filled 2019-08-21 (×9): qty 100
  Filled 2019-08-21: qty 200
  Filled 2019-08-21 (×3): qty 100

## 2019-08-21 MED ORDER — QUETIAPINE FUMARATE 50 MG PO TABS
100.0000 mg | ORAL_TABLET | Freq: Two times a day (BID) | ORAL | Status: DC
  Administered 2019-08-21 – 2019-09-05 (×32): 100 mg
  Filled 2019-08-21 (×32): qty 1

## 2019-08-21 MED ORDER — VITAL HIGH PROTEIN PO LIQD
1000.0000 mL | ORAL | Status: DC
  Administered 2019-08-21 – 2019-09-04 (×16): 1000 mL
  Filled 2019-08-21 (×7): qty 1000

## 2019-08-21 MED ORDER — POTASSIUM CHLORIDE 20 MEQ/15ML (10%) PO SOLN
20.0000 meq | ORAL | Status: AC
  Administered 2019-08-21 (×2): 20 meq
  Filled 2019-08-21 (×2): qty 15

## 2019-08-21 MED ORDER — POTASSIUM CHLORIDE 10 MEQ/50ML IV SOLN
10.0000 meq | INTRAVENOUS | Status: AC
  Administered 2019-08-21 (×4): 10 meq via INTRAVENOUS
  Filled 2019-08-21 (×3): qty 50

## 2019-08-21 MED ORDER — MAGNESIUM SULFATE 2 GM/50ML IV SOLN
2.0000 g | Freq: Once | INTRAVENOUS | Status: AC
  Administered 2019-08-21: 2 g via INTRAVENOUS
  Filled 2019-08-21: qty 50

## 2019-08-21 NOTE — Progress Notes (Signed)
Nutrition Follow-up  DOCUMENTATION CODES:   Morbid obesity  INTERVENTION:   Tube Feeding via Cortrak:  Vital High Protein at 65 ml/hr Pro-Source TF 45 mL TID Provides 1680 kcals, 170 g of protein and 1310 mL of free water Meets 100% protein and calorie needs   NUTRITION DIAGNOSIS:   Inadequate oral intake related to inability to eat as evidenced by NPO status.  Being addressed via TF   GOAL:   Patient will meet greater than or equal to 90% of their needs  Met  MONITOR:   TF tolerance, Vent status, Weight trends, Labs, I & O's  REASON FOR ASSESSMENT:   Consult Enteral/tube feeding initiation and management  ASSESSMENT:   Pt with baseline chronic respiratory failure on the basis of severe OHS/OSA complicated further by significant obstructive disease in the setting of COPD.  Presents with acute on chronic hypoxic and hypercarbic respiratory failure.  Admitted with the initial working diagnosis of pulmonary edema and decompensated diastolic heart failure. PMH includes HTN, COPD, DM.  7/11 Intubated 7/16Extubated but developed stridor, attempted re-intubation unsuccessful, emergentTrach placed 7/21 Cortrak placed 7/23 Bronch, mucous plugg 7/24 Trach dislodged, emergently re-intubated 7/26 ENT replaced trach in OR 7/30 significant trach cuff leak, required oral intubation  Pt remains on vent support. ENT contacted regarding repeat trach per MD notes  Tolerating Vital High Protein at 70 ml/hr, Pro-Source TF at 45 mL 3 times, free water flush 200 mL q 4 hours  Current weight 145.7 kg; admit weight 146.9 kg. Net + 2 L  No new skin breakdown noted  Labs: potassium 3.3 (L), CBGs 112-154 Meds: ss novolog, novolog q 4 hours, levemir, solumedrol, miralax  Diet Order:   Diet Order    None      EDUCATION NEEDS:   No education needs have been identified at this time  Skin:  Skin Assessment: Skin Integrity Issues: Skin Integrity Issues:: Stage II Stage II:  buttock  Last BM:  7/26 rectal tube  Height:   Ht Readings from Last 1 Encounters:  07/30/19 6' (1.829 m)    Weight:   Wt Readings from Last 1 Encounters:  08/21/19 (!) 145.7 kg    Ideal Body Weight:  80.9 kg  BMI:  Body mass index is 43.56 kg/m.  Estimated Nutritional Needs:   Kcal:  3403-5248  Protein:  160-200  Fluid:  >1.6L/d   Kerman Passey MS, RDN, LDN, CNSC Registered Dietitian III Clinical Nutrition RD Pager and On-Call Pager Number Located in [place]

## 2019-08-21 NOTE — Progress Notes (Signed)
eLink Physician-Brief Progress Note Patient Name: Timothy Le DOB: 1960-12-26 MRN: [id number]   Date of Service  08/21/2019  HPI/Events of Note  Request for AM lab orders.  eICU Interventions  Will order CBC with platelets, BMP and Mg++ level for 5 AM.     Intervention Category Major Interventions: Other:  Emilyn Ruble Cornelia Copa 08/21/2019, 4:13 AM

## 2019-08-21 NOTE — Progress Notes (Addendum)
NAME:  Timothy Le, MRN:  [id number], DOB:  [birth year], LOS: 84 ADMISSION DATE:  07/30/2019, CONSULTATION DATE:  7/11 REFERRING MD:  Kathrynn Humble, CHIEF COMPLAINT:  Dyspnea   Brief History   59 year old male with baseline chronic respiratory failure on the basis of severe OHS/OSA complicated further by significant obstructive disease in the setting of COPD. Presents with acute on chronic hypoxic and hypercarbic respiratory failure. Admitted with the initial working diagnosis of pulmonary edema and decompensated diastolic heart failure. Per family patient was without CPAP for 2 weeks PTA as his machine broke. Has required mechanical ventilation for most of the hospitalization, extubated on 7/16, had stridor, unsuccessful re-intubation, had emergent tracheostomy which was difficult.  On 7/24 he became agitated and his tracheostomy dislodged, required emergent re-intubation.  Past Medical History   Past Medical History:  Diagnosis Date  . Asthma   . Chronic respiratory failure with hypoxia ([place]) 07/09/2017  . COPD (chronic obstructive pulmonary disease) ([place])   . Diabetes mellitus without complication ([place])   . Difficult intubation   . Hypertension   . Shortness of breath dyspnea   . Sleep apnea    Significant Hospital Events   7/15-7/16> desat to 70s that resolved with suctioning; febrile  7/16> unsuccessful extubation due to stridor requiring emergent tracheostomy after unsuccessful re-intubation; noted to have R lung atelectasis 7/23> bronch mucus plug 7/24> tracheostomy dislodged due to severe agitation; required emergent reintubation 7/26> tracheostomy revision with ENT 7/30> ETT due to cuff leak  Consults:  ENT   Procedures:  ET tube 7/11>7/16, 7/24> 7/26, 7/30> Trach 7/16>7/25 R IJ 7/11>   Significant Diagnostic Tests:  Echocardiogram 7/11 >> no evidence of RV strain; LVEF 50-55%.Left ventricular diastolic function could not be evaluated. Lower extremity ultrasound 7/11  >> no evidence of lower extremity DVT MRI brain 7/20 > normal MRI brain, extensive sinus mucosal disease, bilateral mastoid effusion, bilateral proptosis  Micro Data:  7/11 SARS COV 2> negative 7/13 resp culture > OPF 7/23 BAL LLL >staph epi  Antimicrobials:  Pip/tazo - 7/16>7/21 Cefepime 7/24 > 7/25 Ceftriaxone 7/24 > 7/27   Interim history/subjective:  Ongoing agitation requiring versed dose and increase in precedex   Objective   Blood pressure 115/67, pulse 52, temperature 98.4 F (36.9 C), temperature source Oral, resp. rate 20, height 6' (1.829 m), weight (!) 145.7 kg, SpO2 98 %.    Vent Mode: PRVC FiO2 (%):  [30 %] 30 % Set Rate:  [15 bmp] 15 bmp Vt Set:  [470 mL] 470 mL PEEP:  [8 cmH20] 8 cmH20 Plateau Pressure:  [17 cmH20-34 cmH20] 17 cmH20   Intake/Output Summary (Last 24 hours) at 08/21/2019 0810 Last data filed at 08/21/2019 3299 Gross per 24 hour  Intake 2457.81 ml  Output 2795 ml  Net -337.19 ml   Filed Weights   08/19/19 0400 08/20/19 0400 08/21/19 0400  Weight: (!) 146.6 kg (!) 146.8 kg (!) 145.7 kg    Examination: General: obese male, mechanically ventilated, appears agitated on vent HENT: [place]/AT, PERRL, EOMI, ETT in place Lungs: distant lung sounds due to body habitus; mild rhonchi bilaterally Cardiovascular: RRR, no m/r/g, distal pulses intact Abdomen: soft, no guarding, normoactive bowel sounds Extremities: warm and dry Neuro: intermittently following commands, remains agitated   Assessment & Plan:  Acute on chronic hypoxic and hypercarbic respiratory failure Has remained mostly mechanically vented throughout admission; prior tracheostomies have been unsuccessful with most recent noted to have cuff leak on 7/30 requiring reintubation.  - Continue full vent support,  wean to PSV as tolerated  - VAP protocol - Discuss with ENT for recommendations of attempting trach again - Solumedrol 49m daily day 2/5  Acute encephalopathy suspect secondary to  ICU delirium  Patient has remained agitated which has made it difficult to wean off vent Continues to require precedex and versed dosing intermittently  - Seroquel 1018mbid, titrate as needed - Precedex gtt, wean as tolerated to maintain RASS 0 to -1  Sinus bradycardia:  wean precedex as tolerated   Diabetes mellitus  Improved glycemic control  - Detemir 55U + Novolog 10U + SSI - CBG monitoring  Normocytic anemia No signs of active bleeding, likely secondary to acute infection - Continue to monitor  Best practice:  Diet: tube feeds Pain/Anxiety/Delirium protocol (if indicated): per protocol VAP protocol (if indicated): per protocol DVT prophylaxis: Lovenox GI prophylaxis: PPI Glucose control: Levemir + SSI Mobility: Bedrest Code Status: FULL Family Communication: Sister, DiValda Faviaupdated 8/2   Disposition: ICU  Critical care time: 40 minutes    SaHarvie HeckMD Internal Medicine, PGY-2 08/21/19 8:46 AM Pager # [phone]

## 2019-08-21 NOTE — Progress Notes (Signed)
Notified provider patient's HR dropped to low 40's, precedex turned off. HR now dropping into mid 30's. Provider to put iin orders.

## 2019-08-21 NOTE — Plan of Care (Signed)
  Problem: Education: Goal: Knowledge of General Education information will improve Description: Including pain rating scale, medication(s)/side effects and non-pharmacologic comfort measures Outcome: Progressing   Problem: Health Behavior/Discharge Planning: Goal: Ability to manage health-related needs will improve Outcome: Progressing   Problem: Clinical Measurements: Goal: Ability to maintain clinical measurements within normal limits will improve Outcome: Progressing Goal: Will remain free from infection Outcome: Progressing Goal: Diagnostic test results will improve Outcome: Progressing Goal: Respiratory complications will improve Outcome: Progressing Goal: Cardiovascular complication will be avoided Outcome: Progressing   Problem: Activity: Goal: Risk for activity intolerance will decrease Outcome: Progressing   Problem: Nutrition: Goal: Adequate nutrition will be maintained Outcome: Progressing   Problem: Coping: Goal: Level of anxiety will decrease Outcome: Progressing   Problem: Elimination: Goal: Will not experience complications related to bowel motility Outcome: Progressing Goal: Will not experience complications related to urinary retention Outcome: Progressing   Problem: Pain Managment: Goal: General experience of comfort will improve Outcome: Progressing   Problem: Safety: Goal: Ability to remain free from injury will improve Outcome: Progressing   Problem: Skin Integrity: Goal: Risk for impaired skin integrity will decrease Outcome: Progressing   Problem: Activity: Goal: Ability to tolerate increased activity will improve Outcome: Progressing   Problem: Respiratory: Goal: Ability to maintain a clear airway and adequate ventilation will improve Outcome: Progressing   Problem: Role Relationship: Goal: Method of communication will improve Outcome: Progressing   Problem: Activity: Goal: Ability to tolerate increased activity will  improve Outcome: Progressing   Problem: Respiratory: Goal: Ability to maintain a clear airway and adequate ventilation will improve Outcome: Progressing   Problem: Role Relationship: Goal: Method of communication will improve Outcome: Progressing   

## 2019-08-21 NOTE — Progress Notes (Signed)
K+ 3.3, Mg 1.7 Replaced per protocol

## 2019-08-22 LAB — CBC
HCT: 31.2 % — ABNORMAL LOW (ref 39.0–52.0)
Hemoglobin: 10.1 g/dL — ABNORMAL LOW (ref 13.0–17.0)
MCH: 29.9 pg (ref 26.0–34.0)
MCHC: 32.4 g/dL (ref 30.0–36.0)
MCV: 92.3 fL (ref 80.0–100.0)
Platelets: 263 10*3/uL (ref 150–400)
RBC: 3.38 MIL/uL — ABNORMAL LOW (ref 4.22–5.81)
RDW: 17.1 % — ABNORMAL HIGH (ref 11.5–15.5)
WBC: 8.3 10*3/uL (ref 4.0–10.5)
nRBC: 1.1 % — ABNORMAL HIGH (ref 0.0–0.2)

## 2019-08-22 LAB — BASIC METABOLIC PANEL
Anion gap: 7 (ref 5–15)
BUN: 35 mg/dL — ABNORMAL HIGH (ref 6–20)
CO2: 31 mmol/L (ref 22–32)
Calcium: 8.2 mg/dL — ABNORMAL LOW (ref 8.9–10.3)
Chloride: 94 mmol/L — ABNORMAL LOW (ref 98–111)
Creatinine, Ser: 0.67 mg/dL (ref 0.61–1.24)
GFR calc Af Amer: >60 mL/min (ref >60)
GFR calc non Af Amer: >60 mL/min (ref >60)
Glucose, Bld: 85 mg/dL (ref 70–99)
Potassium: 3.5 mmol/L (ref 3.5–5.1)
Sodium: 132 mmol/L — ABNORMAL LOW (ref 135–145)

## 2019-08-22 LAB — TRIGLYCERIDES
Triglycerides: 1420 mg/dL — ABNORMAL HIGH (ref <150)
Triglycerides: 135 mg/dL (ref <150)

## 2019-08-22 LAB — GLUCOSE, CAPILLARY
Glucose-Capillary: 105 mg/dL — ABNORMAL HIGH (ref 70–99)
Glucose-Capillary: 116 mg/dL — ABNORMAL HIGH (ref 70–99)
Glucose-Capillary: 116 mg/dL — ABNORMAL HIGH (ref 70–99)
Glucose-Capillary: 121 mg/dL — ABNORMAL HIGH (ref 70–99)
Glucose-Capillary: 147 mg/dL — ABNORMAL HIGH (ref 70–99)
Glucose-Capillary: 195 mg/dL — ABNORMAL HIGH (ref 70–99)
Glucose-Capillary: 84 mg/dL (ref 70–99)
Glucose-Capillary: 84 mg/dL (ref 70–99)

## 2019-08-22 MED ORDER — POTASSIUM CHLORIDE 20 MEQ PO PACK
20.0000 meq | PACK | Freq: Once | ORAL | Status: AC
  Administered 2019-08-22: 20 meq via ORAL
  Filled 2019-08-22: qty 1

## 2019-08-22 MED ORDER — POTASSIUM CHLORIDE 20 MEQ/15ML (10%) PO SOLN
40.0000 meq | Freq: Once | ORAL | Status: AC
  Administered 2019-08-22: 40 meq
  Filled 2019-08-22: qty 30

## 2019-08-22 NOTE — Progress Notes (Addendum)
Pharmacy Electrolyte Replacement  Recent Labs: K: 3.5  Recent Labs    08/21/19 0410 08/21/19 0410 08/22/19 0403  K 3.3*   < > 3.5  MG 1.7  --   --   CREATININE 0.81   < > 0.67   < > = values in this interval not displayed.    Plan: KCl 40 mEq per tube x1 then KCL 20 mEq per tube x1   Wilson Singer, PharmD PGY1 Pharmacy Resident 08/22/2019 9:36 AM

## 2019-08-22 NOTE — Progress Notes (Signed)
RT called by RN d/t pt desat to 37s and un able to pass suction.  RT was unable to pass as well.  RT and RN bag/lavaged pt and removed large plug.  Pt immediately became easy to bag and was placed back on vent.  Elink aware.  RT will continue to monitor.

## 2019-08-22 NOTE — Progress Notes (Signed)
Patient's sister, Ms. [name], updated via telephone. Discussed recommendation for tracheostomy at this point given high risk of extubation failure. She expresses understanding and is agreeable to proceed with the tracheostomy. ENT to schedule for this week or next based on availability.   Harvie Heck, MD Internal Medicine, PGY-2 08/22/19 1:07 PM Pager # [phone]

## 2019-08-22 NOTE — Progress Notes (Signed)
NAME:  Timothy Le, MRN:  [id number], DOB:  1960-09-11, LOS: 37 ADMISSION DATE:  07/30/2019, CONSULTATION DATE:  7/11 REFERRING MD:  Kathrynn Humble, CHIEF COMPLAINT:  Dyspnea   Brief History   59 year old male with baseline chronic respiratory failure on the basis of severe OHS/OSA complicated further by significant obstructive disease in the setting of COPD. Presents with acute on chronic hypoxic and hypercarbic respiratory failure. Admitted with the initial working diagnosis of pulmonary edema and decompensated diastolic heart failure. Per family patient was without CPAP for 2 weeks PTA as his machine broke. Has required mechanical ventilation for most of the hospitalization, extubated on 7/16, had stridor, unsuccessful re-intubation, had emergent tracheostomy which was difficult.  On 7/24 he became agitated and his tracheostomy dislodged, required emergent re-intubation.  Past Medical History   Past Medical History:  Diagnosis Date  . Asthma   . Chronic respiratory failure with hypoxia ([place]) 07/09/2017  . COPD (chronic obstructive pulmonary disease) ([place])   . Diabetes mellitus without complication ([place])   . Difficult intubation   . Hypertension   . Shortness of breath dyspnea   . Sleep apnea    Significant Hospital Events   7/15-7/16> desat to 70s that resolved with suctioning; febrile  7/16> unsuccessful extubation due to stridor requiring emergent tracheostomy after unsuccessful re-intubation; noted to have R lung atelectasis 7/23> bronch mucus plug 7/24> tracheostomy dislodged due to severe agitation; required emergent reintubation 7/26> tracheostomy revision with ENT 7/30> ETT due to cuff leak  Consults:  ENT   Procedures:  ET tube 7/11>7/16, 7/24> 7/26, 7/30> Trach 7/16>7/25 R IJ 7/11>   Significant Diagnostic Tests:  Echocardiogram 7/11 >> no evidence of RV strain; LVEF 50-55%.Left ventricular diastolic function could not be evaluated. Lower extremity ultrasound 7/11  >> no evidence of lower extremity DVT MRI brain 7/20 > normal MRI brain, extensive sinus mucosal disease, bilateral mastoid effusion, bilateral proptosis  Micro Data:  7/11 SARS COV 2> negative 7/13 resp culture > OPF 7/23 BAL LLL >staph epi  Antimicrobials:  Pip/tazo - 7/16>7/21 Cefepime 7/24 > 7/25 Ceftriaxone 7/24 > 7/27   Interim history/subjective:  No acute events overnight. Patient's agitation is improved this morning. He is more interactive this morning and following commands.   Objective   Blood pressure 138/73, pulse 64, temperature 98 F (36.7 C), temperature source Oral, resp. rate (!) 30, height 6' (1.829 m), weight (!) 146.5 kg, SpO2 96 %.    Vent Mode: PRVC FiO2 (%):  [30 %] 30 % Set Rate:  [15 bmp] 15 bmp Vt Set:  [470 mL] 470 mL PEEP:  [8 cmH20] 8 cmH20 Pressure Support:  [10 cmH20] 10 cmH20 Plateau Pressure:  [18 cmH20-19 cmH20] 19 cmH20   Intake/Output Summary (Last 24 hours) at 08/22/2019 0941 Last data filed at 08/22/2019 0700 Gross per 24 hour  Intake 2247.38 ml  Output 1050 ml  Net 1197.38 ml   Filed Weights   08/20/19 0400 08/21/19 0400 08/22/19 0427  Weight: (!) 146.8 kg (!) 145.7 kg (!) 146.5 kg    Examination: General: obese male, mechanically ventilated, no acute distress  HENT: [place]/AT, PERRL, EOMI, ETT in place, anicteric sclerae; bandage over prior tracheostomy site that is partially open without significant bleeding or drainage noted; no signs of infection  Lungs: distant lung sounds due to body habitus; mild rhonchi bilaterally Cardiovascular: RRR, no m/r/g, distal pulses intact Abdomen: soft, no guarding, normoactive bowel sounds Extremities: warm and dry, hand mittens in place  Neuro: awake and alert, following  commands and appropriately interactive; spontaneously moving all extremities   Assessment & Plan:  Acute on chronic hypoxic and hypercarbic respiratory failure Has remained mostly mechanically vented throughout admission; prior  tracheostomies have been unsuccessful with most recent noted to have cuff leak on 7/30 requiring reintubation.  He is high risk for intubation and would recommend for trach. Per ENT recommendations, patient would require total thyroidectomy and cervical lipectomy with suturing of skin flaps to trachea for safe long-lasting tracheostomy.  - Continue vent support, wean to PSV as tolerated  - VAP prevention protocol - Tracheostomy per ENT  - Solumedrol 32m daily day 3/5 - Pulmicort neb 0.271mq12h + duonebs bid   Acute encephalopathy suspect secondary to ICU delirium  Patient's agitation has improved with addition of seroquel. Unable to tolerate precedex due to extreme bradycardia. Was switched to propofol gtt  - Seroquel 1003mid, titrate as needed - Propofol gtt, wean as tolerated to maintain RASS 0 to -1  Diabetes mellitus  Improved glycemic control  - Detemir 55U + Novolog 10U + SSI - CBG monitoring  Hyponatremia: In setting of thiazide use and free water replacement for previous hypernatremia - Stop free water replacement  - Trend BMP   Normocytic anemia No signs of active bleeding, likely secondary to acute infection - Continue to monitor   Best practice:  Diet: tube feeds Pain/Anxiety/Delirium protocol (if indicated): per protocol VAP protocol (if indicated): per protocol DVT prophylaxis: Lovenox GI prophylaxis: PPI Glucose control: Levemir + SSI Mobility: Bedrest Code Status: FULL Family Communication: Sister, DiaValda Faviapdated 8/3   Disposition: ICU  Critical care time: 35 minutes    SadHarvie HeckD Internal Medicine, PGY-2 08/22/19 9:41 AM Pager # [phone]

## 2019-08-22 NOTE — Progress Notes (Signed)
OT Cancellation Note  Patient Details Name: Timothy Le MRN: [id number] DOB: 1961/01/13   Cancelled Treatment:    Reason Eval/Treat Not Completed: Patient not medically ready RN requesting to hold therapy today as pt had large plug removed, difficulty with saturations and suction and is now back on the vent. OT hold off on therapy for today and will return as time allows and pt is appropriate.   [facility] OTR/L Acute Rehabilitation Services Office: [place] 08/22/2019, 10:19 AM

## 2019-08-22 NOTE — Progress Notes (Signed)
PT Cancellation Note  Patient Details Name: Timothy Le MRN: [id number] DOB: [birth year]   Cancelled Treatment:    Reason Eval/Treat Not Completed: Medical issues which prohibited therapy.  Pt back on the vent.  Unable to participate. 08/22/2019  Ginger Carne., PT Acute Rehabilitation Services [phone]  (pager) [phone]  (office)   Tessie Fass Tia Gelb 08/22/2019, 3:41 PM

## 2019-08-22 NOTE — Progress Notes (Signed)
Patient ID: Timothy Le, male   DOB: 07/25/1960, 59 y.o.   MRN: [id number] Events of last week noted.  Unfortunately in order to provide a safe long-lasting tracheostomy as this is going to require a total thyroidectomy and cervical lipectomy with suturing of skin flaps down to the trachea.  If the primary team feels that that will be the only way to get him off of the ventilator then I will try to schedule this sometime this week or next week as scheduling allows.

## 2019-08-23 DIAGNOSIS — L89304 Pressure ulcer of unspecified buttock, stage 4: Secondary | ICD-10-CM

## 2019-08-23 LAB — CBC
HCT: 29.5 % — ABNORMAL LOW (ref 39.0–52.0)
HCT: 32.2 % — ABNORMAL LOW (ref 39.0–52.0)
Hemoglobin: 8.8 g/dL — ABNORMAL LOW (ref 13.0–17.0)
Hemoglobin: 9.7 g/dL — ABNORMAL LOW (ref 13.0–17.0)
MCH: 27.7 pg (ref 26.0–34.0)
MCH: 28 pg (ref 26.0–34.0)
MCHC: 29.8 g/dL — ABNORMAL LOW (ref 30.0–36.0)
MCHC: 30.1 g/dL (ref 30.0–36.0)
MCV: 92.8 fL (ref 80.0–100.0)
MCV: 92.8 fL (ref 80.0–100.0)
Platelets: 262 10*3/uL (ref 150–400)
Platelets: 288 10*3/uL (ref 150–400)
RBC: 3.18 MIL/uL — ABNORMAL LOW (ref 4.22–5.81)
RBC: 3.47 MIL/uL — ABNORMAL LOW (ref 4.22–5.81)
RDW: 17.1 % — ABNORMAL HIGH (ref 11.5–15.5)
RDW: 17.2 % — ABNORMAL HIGH (ref 11.5–15.5)
WBC: 6.8 10*3/uL (ref 4.0–10.5)
WBC: 8.5 10*3/uL (ref 4.0–10.5)
nRBC: 0.4 % — ABNORMAL HIGH (ref 0.0–0.2)
nRBC: 0.4 % — ABNORMAL HIGH (ref 0.0–0.2)

## 2019-08-23 LAB — GLUCOSE, CAPILLARY
Glucose-Capillary: 100 mg/dL — ABNORMAL HIGH (ref 70–99)
Glucose-Capillary: 103 mg/dL — ABNORMAL HIGH (ref 70–99)
Glucose-Capillary: 104 mg/dL — ABNORMAL HIGH (ref 70–99)
Glucose-Capillary: 123 mg/dL — ABNORMAL HIGH (ref 70–99)
Glucose-Capillary: 161 mg/dL — ABNORMAL HIGH (ref 70–99)
Glucose-Capillary: 98 mg/dL (ref 70–99)

## 2019-08-23 LAB — COMPREHENSIVE METABOLIC PANEL
ALT: 33 U/L (ref 0–44)
AST: 26 U/L (ref 15–41)
Albumin: 2.3 g/dL — ABNORMAL LOW (ref 3.5–5.0)
Alkaline Phosphatase: 67 U/L (ref 38–126)
Anion gap: 11 (ref 5–15)
BUN: 36 mg/dL — ABNORMAL HIGH (ref 6–20)
CO2: 31 mmol/L (ref 22–32)
Calcium: 8.4 mg/dL — ABNORMAL LOW (ref 8.9–10.3)
Chloride: 96 mmol/L — ABNORMAL LOW (ref 98–111)
Creatinine, Ser: 0.81 mg/dL (ref 0.61–1.24)
GFR calc Af Amer: >60 mL/min (ref >60)
GFR calc non Af Amer: >60 mL/min (ref >60)
Glucose, Bld: 127 mg/dL — ABNORMAL HIGH (ref 70–99)
Potassium: 3.4 mmol/L — ABNORMAL LOW (ref 3.5–5.1)
Sodium: 138 mmol/L (ref 135–145)
Total Bilirubin: 0.5 mg/dL (ref 0.3–1.2)
Total Protein: 6.5 g/dL (ref 6.5–8.1)

## 2019-08-23 LAB — POCT I-STAT 7, (LYTES, BLD GAS, ICA,H+H)
Acid-Base Excess: 9 mmol/L — ABNORMAL HIGH (ref 0.0–2.0)
Bicarbonate: 34 mmol/L — ABNORMAL HIGH (ref 20.0–28.0)
Calcium, Ion: 1.15 mmol/L (ref 1.15–1.40)
HCT: 29 % — ABNORMAL LOW (ref 39.0–52.0)
Hemoglobin: 9.9 g/dL — ABNORMAL LOW (ref 13.0–17.0)
O2 Saturation: 97 %
Patient temperature: 98.7
Potassium: 3.4 mmol/L — ABNORMAL LOW (ref 3.5–5.1)
Sodium: 139 mmol/L (ref 135–145)
TCO2: 35 mmol/L — ABNORMAL HIGH (ref 22–32)
pCO2 arterial: 48.6 mmHg — ABNORMAL HIGH (ref 32.0–48.0)
pH, Arterial: 7.454 — ABNORMAL HIGH (ref 7.350–7.450)
pO2, Arterial: 91 mmHg (ref 83.0–108.0)

## 2019-08-23 LAB — TRIGLYCERIDES: Triglycerides: 91 mg/dL (ref <150)

## 2019-08-23 MED ORDER — POTASSIUM CHLORIDE 20 MEQ/15ML (10%) PO SOLN
40.0000 meq | Freq: Once | ORAL | Status: AC
  Administered 2019-08-23: 40 meq
  Filled 2019-08-23: qty 30

## 2019-08-23 MED ORDER — IPRATROPIUM-ALBUTEROL 0.5-2.5 (3) MG/3ML IN SOLN
3.0000 mL | Freq: Four times a day (QID) | RESPIRATORY_TRACT | Status: DC | PRN
  Administered 2019-09-03 – 2019-09-13 (×4): 3 mL via RESPIRATORY_TRACT
  Filled 2019-08-23 (×4): qty 3

## 2019-08-23 MED ORDER — INSULIN DETEMIR 100 UNIT/ML ~~LOC~~ SOLN
45.0000 [IU] | Freq: Two times a day (BID) | SUBCUTANEOUS | Status: DC
  Administered 2019-08-23 – 2019-08-26 (×7): 45 [IU] via SUBCUTANEOUS
  Filled 2019-08-23 (×10): qty 0.45

## 2019-08-23 MED ORDER — METHYLPREDNISOLONE SODIUM SUCC 40 MG IJ SOLR
40.0000 mg | Freq: Every day | INTRAMUSCULAR | Status: DC
  Administered 2019-08-23 – 2019-08-27 (×5): 40 mg via INTRAVENOUS
  Filled 2019-08-23 (×6): qty 1

## 2019-08-23 MED ORDER — POTASSIUM CHLORIDE 20 MEQ PO PACK
40.0000 meq | PACK | Freq: Once | ORAL | Status: AC
  Administered 2019-08-23: 40 meq via ORAL
  Filled 2019-08-23: qty 2

## 2019-08-23 NOTE — Progress Notes (Signed)
NAME:  Timothy Le, MRN:  [id number], DOB:  08-18-1960, LOS: 24 ADMISSION DATE:  07/30/2019, CONSULTATION DATE:  7/11 REFERRING MD:  Kathrynn Humble, CHIEF COMPLAINT:  Dyspnea   Brief History   59 year old male with baseline chronic respiratory failure on the basis of severe OHS/OSA complicated further by significant obstructive disease in the setting of COPD. Presents with acute on chronic hypoxic and hypercarbic respiratory failure. Admitted with the initial working diagnosis of pulmonary edema and decompensated diastolic heart failure. Per family patient was without CPAP for 2 weeks PTA as his machine broke. Has required mechanical ventilation for most of the hospitalization, extubated on 7/16, had stridor, unsuccessful re-intubation, had emergent tracheostomy which was difficult.  On 7/24 he became agitated and his tracheostomy dislodged, required emergent re-intubation.  Past Medical History   Past Medical History:  Diagnosis Date  . Asthma   . Chronic respiratory failure with hypoxia ([place]) 07/09/2017  . COPD (chronic obstructive pulmonary disease) ([place])   . Diabetes mellitus without complication ([place])   . Difficult intubation   . Hypertension   . Shortness of breath dyspnea   . Sleep apnea    Significant Hospital Events   7/15-7/16> desat to 70s that resolved with suctioning; febrile  7/16> unsuccessful extubation due to stridor requiring emergent tracheostomy after unsuccessful re-intubation; noted to have R lung atelectasis 7/23> bronch mucus plug 7/24> tracheostomy dislodged due to severe agitation; required emergent reintubation 7/26> tracheostomy revision with ENT 7/30> ETT due to cuff leak  Consults:  ENT   Procedures:  ET tube 7/11>7/16, 7/24> 7/26, 7/30> Trach 7/16>7/25 R IJ 7/11>   Significant Diagnostic Tests:  Echocardiogram 7/11 >> no evidence of RV strain; LVEF 50-55%.Left ventricular diastolic function could not be evaluated. Lower extremity ultrasound 7/11  >> no evidence of lower extremity DVT MRI brain 7/20 > normal MRI brain, extensive sinus mucosal disease, bilateral mastoid effusion, bilateral proptosis  Micro Data:  7/11 SARS COV 2> negative 7/13 resp culture > OPF 7/23 BAL LLL >staph epi  Antimicrobials:  Pip/tazo - 7/16>7/21 Cefepime 7/24 > 7/25 Ceftriaxone 7/24 > 7/27   Interim history/subjective:  No acute overnight events. Patient remains on minimal ventilator support. Scheduled for thyroidectomy with cervical lipectomy for tracheostomy placement by ENT on 8/11.  Objective   Blood pressure (!) 103/57, pulse (!) 53, temperature 98.3 F (36.8 C), temperature source Oral, resp. rate 18, height 6' (1.829 m), weight (!) 146.7 kg, SpO2 99 %.    Vent Mode: PRVC FiO2 (%):  [30 %] 30 % Set Rate:  [15 bmp] 15 bmp Vt Set:  [470 mL] 470 mL PEEP:  [8 cmH20] 8 cmH20 Pressure Support:  [8 cmH20] 8 cmH20 Plateau Pressure:  [18 cmH20] 18 cmH20   Intake/Output Summary (Last 24 hours) at 08/23/2019 3220 Last data filed at 08/23/2019 0300 Gross per 24 hour  Intake 2345.61 ml  Output 1700 ml  Net 645.61 ml   Filed Weights   08/21/19 0400 08/22/19 0427 08/23/19 0500  Weight: (!) 145.7 kg (!) 146.5 kg (!) 146.7 kg    Examination: General: obese male, mechanically ventilated, no acute distress  HENT: [place]/AT, PERRL, EOMI, ETT in place, anicteric sclerae; bandage over prior tracheostomy site that is partially open without significant bleeding or drainage noted; no signs of infection  Lungs: distant lung sounds due to body habitus; mild rhonchi bilaterally Cardiovascular: RRR, no m/r/g, distal pulses intact Abdomen: soft, no guarding, normoactive bowel sounds Extremities: warm and dry, hand mittens in place  Neuro: sleeping,  difficult to arouse; PERRL, moving extremities spontaneously, no apparent focal deficits noted   Assessment & Plan:  Acute on chronic hypoxic and hypercarbic respiratory failure Initially admitted for diastolic  heart failure exacerbation and has remained mostly mechanically vented throughout admission; prior tracheostomies have been unsuccessful with most recent noted to have cuff leak on 7/30 requiring reintubation.  He is high risk for intubation and would recommend for trach. Patient is scheduled for  total thyroidectomy and cervical lipectomy with suturing of skin flaps to to trachea for tracheostomy on 8/11 with ENT.  - Continue vent support, wean to PSV as tolerated  - VAP prevention protocol - Tracheostomy per ENT  - Decreasing solumedrol to 16m daily day 1/5 - Pulmicort neb 0.227mq12h + duonebs bid   Acute metabolic toxic encephalopathy suspect secondary to ICU delirium  Patient's agitation has improved with addition of seroquel.  - Seroquel 10092mid, titrate as needed - Propofol gtt, wean as tolerated to maintain RASS 0 to -1  Diabetes mellitus  Improved glycemic control  - Detemir 45U + Novolog 10U + SSI - CBG monitoring  Normocytic anemia No signs of active bleeding, likely secondary to acute infection - Continue to monitor   Best practice:  Diet: tube feeds Pain/Anxiety/Delirium protocol (if indicated): per protocol VAP protocol (if indicated): per protocol DVT prophylaxis: Lovenox GI prophylaxis: PPI Glucose control: Levemir + SSI Mobility: Bedrest Code Status: FULL Family Communication: daily updates to patient's sister, DiaTheresa Dutyisposition: ICU  Critical care time: 30 minutes    SadHarvie HeckD Internal Medicine, PGY-2 08/23/19 6:39 AM Pager # [phone]

## 2019-08-23 NOTE — Progress Notes (Signed)
PT Cancellation Note  Patient Details Name: Timothy Le MRN: [id number] DOB: [birth year]   Cancelled Treatment:    Reason Eval/Treat Not Completed: Medical issues which prohibited therapy.  Reintubated and sedated. 08/23/2019  Ginger Carne., PT Acute Rehabilitation Services [phone]  (pager) [phone]  (office)   Tessie Fass [name] 08/23/2019, 4:12 PM

## 2019-08-23 NOTE — Progress Notes (Signed)
K+ 3.4 Replaced per protocol  

## 2019-08-23 NOTE — Progress Notes (Signed)
OT Cancellation Note  Patient Details Name: Timothy Le MRN: [id number] DOB: 09/12/1960   Cancelled Treatment:    Reason Eval/Treat Not Completed: Patient not medically readyPer RN, pt not appropriate at this time. He currently remains sedated and intubated. Due to pt's agitation during attempts to wean off sedation and difficult airway, pt continues to remain sedated. OT will sign off at this time due. Please re-order OT consult should pt's status improve. Thank you.  [facility] OTR/L Acute Rehabilitation Services Office: [place] 08/23/2019, 4:25 PM

## 2019-08-23 NOTE — Progress Notes (Addendum)
Pharmacy Electrolyte Replacement  Recent Labs: K 3.4   Recent Labs    08/21/19 0410 08/22/19 0403 08/23/19 0456  K 3.3*   < > 3.4*  MG 1.7  --   --   CREATININE 0.81   < > 0.81   < > = values in this interval not displayed.    Plan: KCl 40 mEq x1  (for a total of 80 mEq replacement today)  Wilson Singer, PharmD PGY1 Pharmacy Resident 08/23/2019 9:52 AM

## 2019-08-24 DIAGNOSIS — L89300 Pressure ulcer of unspecified buttock, unstageable: Secondary | ICD-10-CM

## 2019-08-24 LAB — GLUCOSE, CAPILLARY
Glucose-Capillary: 102 mg/dL — ABNORMAL HIGH (ref 70–99)
Glucose-Capillary: 111 mg/dL — ABNORMAL HIGH (ref 70–99)
Glucose-Capillary: 128 mg/dL — ABNORMAL HIGH (ref 70–99)
Glucose-Capillary: 142 mg/dL — ABNORMAL HIGH (ref 70–99)
Glucose-Capillary: 158 mg/dL — ABNORMAL HIGH (ref 70–99)
Glucose-Capillary: 91 mg/dL (ref 70–99)

## 2019-08-24 LAB — BASIC METABOLIC PANEL
Anion gap: 7 (ref 5–15)
BUN: 31 mg/dL — ABNORMAL HIGH (ref 6–20)
CO2: 32 mmol/L (ref 22–32)
Calcium: 8.4 mg/dL — ABNORMAL LOW (ref 8.9–10.3)
Chloride: 99 mmol/L (ref 98–111)
Creatinine, Ser: 0.85 mg/dL (ref 0.61–1.24)
GFR calc Af Amer: >60 mL/min (ref >60)
GFR calc non Af Amer: >60 mL/min (ref >60)
Glucose, Bld: 141 mg/dL — ABNORMAL HIGH (ref 70–99)
Potassium: 3.8 mmol/L (ref 3.5–5.1)
Sodium: 138 mmol/L (ref 135–145)

## 2019-08-24 LAB — HEPATIC FUNCTION PANEL
ALT: 32 U/L (ref 0–44)
AST: 24 U/L (ref 15–41)
Albumin: 2.5 g/dL — ABNORMAL LOW (ref 3.5–5.0)
Alkaline Phosphatase: 71 U/L (ref 38–126)
Bilirubin, Direct: <0.1 mg/dL (ref 0.0–0.2)
Total Bilirubin: 0.3 mg/dL (ref 0.3–1.2)
Total Protein: 7.2 g/dL (ref 6.5–8.1)

## 2019-08-24 LAB — POCT I-STAT 7, (LYTES, BLD GAS, ICA,H+H)
Acid-Base Excess: 9 mmol/L — ABNORMAL HIGH (ref 0.0–2.0)
Bicarbonate: 34.4 mmol/L — ABNORMAL HIGH (ref 20.0–28.0)
Calcium, Ion: 1.16 mmol/L (ref 1.15–1.40)
HCT: 28 % — ABNORMAL LOW (ref 39.0–52.0)
Hemoglobin: 9.5 g/dL — ABNORMAL LOW (ref 13.0–17.0)
O2 Saturation: 99 %
Patient temperature: 98.6
Potassium: 3.7 mmol/L (ref 3.5–5.1)
Sodium: 141 mmol/L (ref 135–145)
TCO2: 36 mmol/L — ABNORMAL HIGH (ref 22–32)
pCO2 arterial: 50.3 mmHg — ABNORMAL HIGH (ref 32.0–48.0)
pH, Arterial: 7.444 (ref 7.350–7.450)
pO2, Arterial: 123 mmHg — ABNORMAL HIGH (ref 83.0–108.0)

## 2019-08-24 LAB — CBC
HCT: 31.2 % — ABNORMAL LOW (ref 39.0–52.0)
Hemoglobin: 9.5 g/dL — ABNORMAL LOW (ref 13.0–17.0)
MCH: 28.6 pg (ref 26.0–34.0)
MCHC: 30.4 g/dL (ref 30.0–36.0)
MCV: 94 fL (ref 80.0–100.0)
Platelets: 290 10*3/uL (ref 150–400)
RBC: 3.32 MIL/uL — ABNORMAL LOW (ref 4.22–5.81)
RDW: 17.4 % — ABNORMAL HIGH (ref 11.5–15.5)
WBC: 6.7 10*3/uL (ref 4.0–10.5)
nRBC: 0.3 % — ABNORMAL HIGH (ref 0.0–0.2)

## 2019-08-24 MED ORDER — MIDAZOLAM 50MG/50ML (1MG/ML) PREMIX INFUSION
0.5000 mg/h | INTRAVENOUS | Status: DC
  Administered 2019-08-24: 0.5 mg/h via INTRAVENOUS
  Administered 2019-08-24: 5 mg/h via INTRAVENOUS
  Administered 2019-08-25: 7 mg/h via INTRAVENOUS
  Filled 2019-08-24 (×3): qty 50

## 2019-08-24 MED ORDER — POTASSIUM CHLORIDE 20 MEQ PO PACK
40.0000 meq | PACK | Freq: Once | ORAL | Status: AC
  Administered 2019-08-24: 40 meq via ORAL
  Filled 2019-08-24: qty 2

## 2019-08-24 MED ORDER — MELATONIN 3 MG PO TABS
3.0000 mg | ORAL_TABLET | Freq: Every day | ORAL | Status: DC
  Administered 2019-08-24: 3 mg via ORAL
  Filled 2019-08-24: qty 1

## 2019-08-24 NOTE — Progress Notes (Signed)
NAME:  Timothy Le, MRN:  [id number], DOB:  January 29, 1960, LOS: 26 ADMISSION DATE:  07/30/2019, CONSULTATION DATE:  7/11 REFERRING MD:  Kathrynn Humble, CHIEF COMPLAINT:  Dyspnea   Brief History   59 year old male with baseline chronic respiratory failure on the basis of severe OHS/OSA complicated further by significant obstructive disease in the setting of COPD. Presents with acute on chronic hypoxic and hypercarbic respiratory failure. Admitted with the initial working diagnosis of pulmonary edema and decompensated diastolic heart failure. Per family patient was without CPAP for 2 weeks PTA as his machine broke. Has required mechanical ventilation for most of the hospitalization, extubated on 7/16, had stridor, unsuccessful re-intubation, had emergent tracheostomy which was difficult.  On 7/24 he became agitated and his tracheostomy dislodged, required emergent re-intubation.  Past Medical History   Past Medical History:  Diagnosis Date  . Asthma   . Chronic respiratory failure with hypoxia ([place]) 07/09/2017  . COPD (chronic obstructive pulmonary disease) ([place])   . Diabetes mellitus without complication ([place])   . Difficult intubation   . Hypertension   . Shortness of breath dyspnea   . Sleep apnea    Significant Hospital Events   7/15-7/16> desat to 70s that resolved with suctioning; febrile  7/16> unsuccessful extubation due to stridor requiring emergent tracheostomy after unsuccessful re-intubation; noted to have R lung atelectasis 7/23> bronch mucus plug 7/24> tracheostomy dislodged due to severe agitation; required emergent reintubation 7/26> tracheostomy revision with ENT 7/30> ETT due to cuff leak  Consults:  ENT   Procedures:  ET tube 7/11>7/16, 7/24> 7/26, 7/30> Trach 7/16>7/25 R IJ 7/11>   Significant Diagnostic Tests:  Echocardiogram 7/11 >> no evidence of RV strain; LVEF 50-55%.Left ventricular diastolic function could not be evaluated. Lower extremity ultrasound 7/11  >> no evidence of lower extremity DVT MRI brain 7/20 > normal MRI brain, extensive sinus mucosal disease, bilateral mastoid effusion, bilateral proptosis  Micro Data:  7/11 SARS COV 2> negative 7/13 resp culture > OPF 7/23 BAL LLL >staph epi  Antimicrobials:  Pip/tazo - 7/16>7/21 Cefepime 7/24 > 7/25 Ceftriaxone 7/24 > 7/27   Interim history/subjective:  No acute overnight events. Remains mechanically intubated, tolerated pressure support yesterday.   Objective   Blood pressure 130/66, pulse 64, temperature 97.8 F (36.6 C), temperature source Axillary, resp. rate 19, height 6' (1.829 m), weight (!) 146.3 kg, SpO2 99 %.    Vent Mode: PRVC FiO2 (%):  [30 %] 30 % Set Rate:  [15 bmp] 15 bmp Vt Set:  [470 mL] 470 mL PEEP:  [8 cmH20] 8 cmH20 Pressure Support:  [8 cmH20] 8 cmH20 Plateau Pressure:  [17 cmH20-18 cmH20] 17 cmH20   Intake/Output Summary (Last 24 hours) at 08/24/2019 0716 Last data filed at 08/24/2019 0600 Gross per 24 hour  Intake 2569.75 ml  Output 2875 ml  Net -305.25 ml   Filed Weights   08/22/19 0427 08/23/19 0500 08/24/19 0535  Weight: (!) 146.5 kg (!) 146.7 kg (!) 146.3 kg    Examination: General: obese male, mechanically ventilated, no acute distress  HENT: [place]/AT, PERRL, EOMI, ETT in place, anicteric sclerae; bandage over prior tracheostomy site that is partially open without significant bleeding or drainage noted; no signs of infection  Lungs: distant lung sounds due to body habitus; mild rhonchi bilaterally with slightly diminished bibasilar breath sounds  Cardiovascular: RRR, no m/r/g, distal pulses intact Abdomen: soft, no guarding, normoactive bowel sounds Extremities: warm and dry, non pitting bilateral lower extremity edema  Neuro: sleeping, but does arouse  to painful stimuli; PERRL, moving extremities spontaneously, no apparent focal deficits noted   Assessment & Plan:  Acute on chronic hypoxic and hypercarbic respiratory failure Initially  admitted for diastolic heart failure exacerbation and has remained mostly mechanically vented throughout admission; prior tracheostomies have been unsuccessful with most recent noted to have cuff leak on 7/30 requiring reintubation.  Patient is scheduled for  total thyroidectomy and cervical lipectomy with suturing of skin flaps to to trachea for tracheostomy on 8/11 with ENT.  - Continue vent support,  PSV as tolerated  - VAP prevention protocol - Tracheostomy per ENT  - Decreasing solumedrol to 41m daily day 2/5 - Pulmicort neb 0.223mq12h + duonebs bid   Acute metabolic toxic encephalopathy suspect secondary to ICU delirium  Patient's agitation has improved with addition of seroquel.  - Seroquel 10047mid, titrate as needed - Versed gtt for goal RASS 0 to -1  At risk for propofol-related infusion syndrome Noted to have green urine this morning. Electrolytes and renal function wnl. Will discontinue propofol at this time and continue to monitor. - Sedation with versed gtt as above  Diabetes mellitus  Improved glycemic control  - Detemir 45U + Novolog 10U + SSI - CBG monitoring  Normocytic anemia No signs of active bleeding, likely secondary to acute infection - Continue to monitor   Best practice:  Diet: tube feeds Pain/Anxiety/Delirium protocol (if indicated): per protocol VAP protocol (if indicated): per protocol DVT prophylaxis: Lovenox GI prophylaxis: PPI Glucose control: Levemir + SSI Mobility: Bedrest Code Status: FULL Family Communication: daily updates to patient's sister, DiaTheresa Dutyisposition: ICU  Critical care time: 30 minutes    SadHarvie HeckD Internal Medicine, PGY-2 08/24/19 7:16 AM Pager # [phone]

## 2019-08-24 NOTE — Progress Notes (Signed)
Patient's sister, Ms Adelfa Koh, updated on patient status and plan for tracheostomy on August 11th. All questions/concerns addressed.  Harvie Heck, MD Internal Medicine, PGY-2 08/24/19 2:45 PM Pager # [phone]

## 2019-08-24 NOTE — Progress Notes (Signed)
PT Cancellation Note  Patient Details Name: Timothy Le MRN: [id number] DOB: 08-15-1960   Cancelled Treatment:    Reason Eval/Treat Not Completed: Medical issues which prohibited therapy.  Status decline.  Per RN signoff.  Please reorder when pt is more appropriate for therapies. 08/24/2019  Timothy Carne., PT Acute Rehabilitation Services [phone]  (pager) [phone]  (office)   Timothy Le 08/24/2019, 3:54 PM

## 2019-08-24 NOTE — Progress Notes (Addendum)
Pharmacy Electrolyte Replacement  Recent Labs: K 3.8  Recent Labs    08/24/19 0516  K 3.8  CREATININE 0.85    Plan: KCl 40 mEq x 1 today.   Wilson Singer, PharmD PGY1 Pharmacy Resident 08/24/2019 9:50 AM   I discussed / reviewed the pharmacy note by Dr. Alroy Dust and I agree with the resident's findings and plans as documented.   Sloan Leiter, PharmD, BCPS, BCCCP Clinical Pharmacist Please refer to [facility] for [place] numbers 08/24/2019, 10:44 AM

## 2019-08-25 LAB — CBC
HCT: 31.1 % — ABNORMAL LOW (ref 39.0–52.0)
Hemoglobin: 9.1 g/dL — ABNORMAL LOW (ref 13.0–17.0)
MCH: 27.3 pg (ref 26.0–34.0)
MCHC: 29.3 g/dL — ABNORMAL LOW (ref 30.0–36.0)
MCV: 93.4 fL (ref 80.0–100.0)
Platelets: 270 10*3/uL (ref 150–400)
RBC: 3.33 MIL/uL — ABNORMAL LOW (ref 4.22–5.81)
RDW: 17.5 % — ABNORMAL HIGH (ref 11.5–15.5)
WBC: 7.9 10*3/uL (ref 4.0–10.5)
nRBC: 0.3 % — ABNORMAL HIGH (ref 0.0–0.2)

## 2019-08-25 LAB — BASIC METABOLIC PANEL
Anion gap: 10 (ref 5–15)
BUN: 29 mg/dL — ABNORMAL HIGH (ref 6–20)
CO2: 29 mmol/L (ref 22–32)
Calcium: 8.4 mg/dL — ABNORMAL LOW (ref 8.9–10.3)
Chloride: 99 mmol/L (ref 98–111)
Creatinine, Ser: 0.67 mg/dL (ref 0.61–1.24)
GFR calc Af Amer: >60 mL/min (ref >60)
GFR calc non Af Amer: >60 mL/min (ref >60)
Glucose, Bld: 134 mg/dL — ABNORMAL HIGH (ref 70–99)
Potassium: 3.4 mmol/L — ABNORMAL LOW (ref 3.5–5.1)
Sodium: 138 mmol/L (ref 135–145)

## 2019-08-25 LAB — GLUCOSE, CAPILLARY
Glucose-Capillary: 100 mg/dL — ABNORMAL HIGH (ref 70–99)
Glucose-Capillary: 106 mg/dL — ABNORMAL HIGH (ref 70–99)
Glucose-Capillary: 128 mg/dL — ABNORMAL HIGH (ref 70–99)
Glucose-Capillary: 170 mg/dL — ABNORMAL HIGH (ref 70–99)
Glucose-Capillary: 175 mg/dL — ABNORMAL HIGH (ref 70–99)
Glucose-Capillary: 97 mg/dL (ref 70–99)

## 2019-08-25 LAB — MAGNESIUM: Magnesium: 1.6 mg/dL — ABNORMAL LOW (ref 1.7–2.4)

## 2019-08-25 MED ORDER — POTASSIUM CHLORIDE 20 MEQ/15ML (10%) PO SOLN
40.0000 meq | Freq: Once | ORAL | Status: AC
  Administered 2019-08-25: 40 meq
  Filled 2019-08-25: qty 30

## 2019-08-25 MED ORDER — MELATONIN 3 MG PO TABS
3.0000 mg | ORAL_TABLET | Freq: Every day | ORAL | Status: DC
  Administered 2019-08-25 – 2019-09-05 (×12): 3 mg
  Filled 2019-08-25 (×12): qty 1

## 2019-08-25 MED ORDER — DEXMEDETOMIDINE HCL IN NACL 400 MCG/100ML IV SOLN
0.1000 ug/kg/h | INTRAVENOUS | Status: DC
  Administered 2019-08-25: 0.4 ug/kg/h via INTRAVENOUS
  Administered 2019-08-26 – 2019-08-27 (×3): 0.2 ug/kg/h via INTRAVENOUS
  Administered 2019-08-27: 0.4 ug/kg/h via INTRAVENOUS
  Administered 2019-08-27: 0.2 ug/kg/h via INTRAVENOUS
  Filled 2019-08-25 (×5): qty 100

## 2019-08-25 MED ORDER — MIDAZOLAM HCL 2 MG/2ML IJ SOLN
2.0000 mg | INTRAMUSCULAR | Status: AC | PRN
  Administered 2019-08-25 – 2019-08-27 (×3): 2 mg via INTRAVENOUS
  Filled 2019-08-25 (×2): qty 2

## 2019-08-25 MED ORDER — MIDAZOLAM HCL 2 MG/2ML IJ SOLN
2.0000 mg | INTRAMUSCULAR | Status: DC | PRN
  Administered 2019-08-25 – 2019-08-30 (×19): 2 mg via INTRAVENOUS
  Filled 2019-08-25 (×18): qty 2

## 2019-08-25 MED ORDER — POTASSIUM CHLORIDE 20 MEQ PO PACK
40.0000 meq | PACK | Freq: Once | ORAL | Status: AC
  Administered 2019-08-25: 40 meq
  Filled 2019-08-25: qty 2

## 2019-08-25 NOTE — Progress Notes (Signed)
eLink Physician-Brief Progress Note Patient Name: Timothy Le DOB: 01/29/1960 MRN: [id number]   Date of Service  08/25/2019  HPI/Events of Note  Patient is interactive but a bit restless on PRN Versed.  eICU Interventions  Will start low level Precedex for comfort.        Kerry Kass Dorraine Ellender 08/25/2019, 9:01 PM

## 2019-08-25 NOTE — Progress Notes (Addendum)
Pharmacy Electrolyte Replacement  Recent Labs: K 3.4  Recent Labs    08/25/19 0359  K 3.4*  MG 1.6*  CREATININE 0.67    Plan: KCl 40 mEq x1 (for a total of 80 mEq KCl today)    Wilson Singer, PharmD PGY1 Pharmacy Resident 08/25/2019 11:21 AM   Sloan Leiter, PharmD, BCPS, BCCCP Clinical Pharmacist Please refer to [facility] for [place] numbers 08/25/2019, 1:10 PM

## 2019-08-25 NOTE — Progress Notes (Signed)
NAME:  Timothy Le, MRN:  [id number], DOB:  [birth year], LOS: 9 ADMISSION DATE:  07/30/2019, CONSULTATION DATE:  7/11 REFERRING MD:  Kathrynn Humble, CHIEF COMPLAINT:  Dyspnea   Brief History   59 year old male with baseline chronic respiratory failure on the basis of severe OHS/OSA complicated further by significant obstructive disease in the setting of COPD. Presents with acute on chronic hypoxic and hypercarbic respiratory failure. Admitted with the initial working diagnosis of pulmonary edema and decompensated diastolic heart failure. Per family patient was without CPAP for 2 weeks PTA as his machine broke. Has required mechanical ventilation for most of the hospitalization, extubated on 7/16, had stridor, unsuccessful re-intubation, had emergent tracheostomy which was difficult.  On 7/24 he became agitated and his tracheostomy dislodged, required emergent re-intubation.  Past Medical History   Past Medical History:  Diagnosis Date  . Asthma   . Chronic respiratory failure with hypoxia ([place]) 07/09/2017  . COPD (chronic obstructive pulmonary disease) ([place])   . Diabetes mellitus without complication ([place])   . Difficult intubation   . Hypertension   . Shortness of breath dyspnea   . Sleep apnea    Significant Hospital Events   7/15-7/16> desat to 70s that resolved with suctioning; febrile  7/16> unsuccessful extubation due to stridor requiring emergent tracheostomy after unsuccessful re-intubation; noted to have R lung atelectasis 7/23> bronch mucus plug 7/24> tracheostomy dislodged due to severe agitation; required emergent reintubation 7/26> tracheostomy revision with ENT 7/30> ETT due to cuff leak  Consults:  ENT   Procedures:  ET tube 7/11>7/16, 7/24> 7/26, 7/30> Trach 7/16>7/25 R IJ 7/11>   Significant Diagnostic Tests:  Echocardiogram 7/11 >> no evidence of RV strain; LVEF 50-55%.Left ventricular diastolic function could not be evaluated. Lower extremity ultrasound 7/11  >> no evidence of lower extremity DVT MRI brain 7/20 > normal MRI brain, extensive sinus mucosal disease, bilateral mastoid effusion, bilateral proptosis  Micro Data:  7/11 SARS COV 2> negative 7/13 resp culture > OPF 7/23 BAL LLL >staph epi  Antimicrobials:  Pip/tazo - 7/16>7/21 Cefepime 7/24 > 7/25 Ceftriaxone 7/24 > 7/27   Interim history/subjective:  No acute overnight events. Remains mechanically intubated, tolerating pressure support throughout the day.  Objective   Blood pressure 124/80, pulse 89, temperature 98.7 F (37.1 C), temperature source Axillary, resp. rate (!) 23, height 6' (1.829 m), weight (!) 144.8 kg, SpO2 96 %.    Vent Mode: PRVC FiO2 (%):  [30 %] 30 % Set Rate:  [15 bmp] 15 bmp Vt Set:  [470 mL] 470 mL PEEP:  [8 cmH20] 8 cmH20 Pressure Support:  [8 cmH20] 8 cmH20 Plateau Pressure:  [19 cmH20] 19 cmH20   Intake/Output Summary (Last 24 hours) at 08/25/2019 0830 Last data filed at 08/25/2019 0700 Gross per 24 hour  Intake 2133.93 ml  Output 1800 ml  Net 333.93 ml   Filed Weights   08/23/19 0500 08/24/19 0535 08/25/19 0438  Weight: (!) 146.7 kg (!) 146.3 kg (!) 144.8 kg    Examination: General: obese male, mechanically ventilated, no acute distress  HENT: [place]/AT, PERRL, EOMI, ETT in place, anicteric sclerae; bandage over prior tracheostomy site that is partially open without significant bleeding or drainage noted; no signs of infection  Lungs: distant lung sounds due to body habitus; mild rhonchi bilaterally with slightly diminished bibasilar breath sounds  Cardiovascular: RRR, no m/r/g, distal pulses intact Abdomen: soft, no guarding, normoactive bowel sounds Extremities: warm and dry, non pitting bilateral lower extremity edema  Neuro: awake, intermittently following  commands; PERRL, moving extremities spontaneously, no apparent focal deficits noted   Assessment & Plan:  Acute on chronic hypoxic and hypercarbic respiratory failure Initially  admitted for diastolic heart failure exacerbation and has remained mostly mechanically vented throughout admission; prior tracheostomies have been unsuccessful with most recent noted to have cuff leak on 7/30 requiring reintubation.  Patient is scheduled for  total thyroidectomy and cervical lipectomy with suturing of skin flaps to to trachea for tracheostomy on 8/11 with ENT.  - Continue vent support,  PSV as tolerated  - VAP prevention protocol - Tracheostomy per ENT  - Decreasing solumedrol to 41m q24h, plan to discontinue this tomorrow  - Pulmicort neb 0.214mq12h + duonebs bid   Acute metabolic toxic encephalopathy Secondary to versed gtt. Plan to discontinue gtt today. RASS goal 0 to 1 - Seroquel 1009mid, titrate as needed - Versed 2mg44mn  Diabetes mellitus  Improved glycemic control  - Detemir 45U + Novolog 10U + SSI - CBG monitoring  Normocytic anemia No signs of active bleeding, likely secondary to acute infection - Continue to monitor   Best practice:  Diet: tube feeds Pain/Anxiety/Delirium protocol (if indicated): per protocol VAP protocol (if indicated): per protocol DVT prophylaxis: Lovenox GI prophylaxis: PPI Glucose control: Levemir + SSI Mobility: Bedrest Code Status: FULL Family Communication: daily updates to patient's sister, DianTheresa Dutysposition: ICU  Critical care time: 30 minutes    SadiHarvie Heck Internal Medicine, PGY-2 08/25/19 8:30 AM Pager # [phone]

## 2019-08-25 NOTE — Progress Notes (Signed)
[facility] ADULT ICU REPLACEMENT PROTOCOL   The patient does apply for the [facility] Adult ICU Electrolyte Replacment Protocol based on the criteria listed below:   1. Is GFR >/= 30 ml/min? Yes.    Patient's GFR today is >60 2. Is SCr </= 2? Yes.   Patient's SCr is 0.67 ml/kg/hr 3. Did SCr increase >/= 0.5 in 24 hours? No. 4. Abnormal electrolyte(s): k 3.4 5. Ordered repletion with: protocol 6. If a panic level lab has been reported, has the CCM MD in charge been notified? No..   Physician:    Ronda Fairly A 08/25/2019 6:32 AM

## 2019-08-25 NOTE — Progress Notes (Signed)
Patient's sister, Ms Adelfa Koh, updated via phone. All questions and concerns addressed.  Harvie Heck, MD Internal Medicine, PGY-2 08/25/19 3:59 PM Pager # [phone]

## 2019-08-26 DIAGNOSIS — E119 Type 2 diabetes mellitus without complications: Secondary | ICD-10-CM

## 2019-08-26 DIAGNOSIS — Z794 Long term (current) use of insulin: Secondary | ICD-10-CM

## 2019-08-26 LAB — GLUCOSE, CAPILLARY
Glucose-Capillary: 139 mg/dL — ABNORMAL HIGH (ref 70–99)
Glucose-Capillary: 88 mg/dL (ref 70–99)
Glucose-Capillary: 127 mg/dL — ABNORMAL HIGH (ref 70–99)
Glucose-Capillary: 170 mg/dL — ABNORMAL HIGH (ref 70–99)
Glucose-Capillary: 137 mg/dL — ABNORMAL HIGH (ref 70–99)
Glucose-Capillary: 75 mg/dL (ref 70–99)

## 2019-08-26 MED ORDER — INSULIN DETEMIR 100 UNIT/ML ~~LOC~~ SOLN
40.0000 [IU] | Freq: Two times a day (BID) | SUBCUTANEOUS | Status: DC
  Administered 2019-08-26: 40 [IU] via SUBCUTANEOUS
  Filled 2019-08-26 (×3): qty 0.4

## 2019-08-26 NOTE — Progress Notes (Signed)
NAME:  Timothy Le, MRN:  [id number], DOB:  18-Sep-1960, LOS: 51 ADMISSION DATE:  07/30/2019, CONSULTATION DATE:  7/11 REFERRING MD:  Kathrynn Humble, CHIEF COMPLAINT:  Dyspnea   Brief History   59 year old male with baseline chronic respiratory failure on the basis of severe OHS/OSA complicated further by significant obstructive disease in the setting of COPD. Presents with acute on chronic hypoxic and hypercarbic respiratory failure. Admitted with the initial working diagnosis of pulmonary edema and decompensated diastolic heart failure. Per family patient was without CPAP for 2 weeks PTA as his machine broke. Has required mechanical ventilation for most of the hospitalization, extubated on 7/16, had stridor, unsuccessful re-intubation, had emergent tracheostomy which was difficult.  On 7/24 he became agitated and his tracheostomy dislodged, required emergent re-intubation.  Past Medical History   Past Medical History:  Diagnosis Date  . Asthma   . Chronic respiratory failure with hypoxia ([place]) 07/09/2017  . COPD (chronic obstructive pulmonary disease) ([place])   . Diabetes mellitus without complication ([place])   . Difficult intubation   . Hypertension   . Shortness of breath dyspnea   . Sleep apnea    Significant Hospital Events   7/15-7/16> desat to 70s that resolved with suctioning; febrile  7/16> unsuccessful extubation due to stridor requiring emergent tracheostomy after unsuccessful re-intubation; noted to have R lung atelectasis 7/23> bronch mucus plug 7/24> tracheostomy dislodged due to severe agitation; required emergent reintubation 7/26> tracheostomy revision with ENT 7/30> ETT due to cuff leak  Consults:  ENT   Procedures:  ET tube 7/11>7/16, 7/24> 7/26, 7/30> Trach 7/16>7/25 R IJ 7/11>   Significant Diagnostic Tests:  Echocardiogram 7/11 >> no evidence of RV strain; LVEF 50-55%.Left ventricular diastolic function could not be evaluated. Lower extremity ultrasound 7/11  >> no evidence of lower extremity DVT MRI brain 7/20 > normal MRI brain, extensive sinus mucosal disease, bilateral mastoid effusion, bilateral proptosis  Micro Data:  7/11 SARS COV 2> negative 7/13 resp culture > OPF 7/23 BAL LLL >staph epi  Antimicrobials:  Pip/tazo - 7/16>7/21 Cefepime 7/24 > 7/25 Ceftriaxone 7/24 > 7/27   Interim history/subjective:  No acute overnight events. Remains mechanically intubated, tolerating pressure support throughout the day. Visibly frustrated with inability to communicate.  Objective   Blood pressure 115/63, pulse (!) 59, temperature 98.3 F (36.8 C), temperature source Oral, resp. rate 17, height 6' (1.829 m), weight (!) 148.2 kg, SpO2 94 %.    Vent Mode: PRVC FiO2 (%):  [30 %] 30 % Set Rate:  [15 bmp] 15 bmp Vt Set:  [470 mL] 470 mL PEEP:  [8 cmH20] 8 cmH20 Pressure Support:  [8 cmH20] 8 cmH20 Plateau Pressure:  [18 cmH20] 18 cmH20   Intake/Output Summary (Last 24 hours) at 08/26/2019 1033 Last data filed at 08/26/2019 0900 Gross per 24 hour  Intake 1058.28 ml  Output 1600 ml  Net -541.72 ml   Filed Weights   08/24/19 0535 08/25/19 0438 08/26/19 0500  Weight: (!) 146.3 kg (!) 144.8 kg (!) 148.2 kg    Examination: General: obese male, mechanically ventilated, no acute distress  HENT: [place]/AT, PERRL, EOMI, ETT in place, anicteric sclerae; occlusive bandage over prior tracheostomy site  Lungs: distant lung sounds due to body habitus; mild rhonchi bilaterally with slightly diminished bibasilar breath sounds  Cardiovascular: RRR, no m/r/g, distal pulses intact Abdomen: soft, no guarding, normoactive bowel sounds Extremities: warm and dry, non pitting bilateral lower extremity edema  Neuro: awake, intermittently following commands; PERRL, moving extremities spontaneously, no apparent  focal deficits noted   Assessment & Plan:  Acute on chronic hypoxic and hypercarbic respiratory failure Initially admitted for diastolic heart failure  exacerbation and has remained mostly mechanically vented throughout admission; prior tracheostomies have been unsuccessful with most recent noted to have cuff leak on 7/30 requiring reintubation.  Patient is scheduled for  total thyroidectomy and cervical lipectomy with suturing of skin flaps to to trachea for tracheostomy on 8/11 with ENT.  - Continue vent support,  PSV as tolerated  - VAP prevention protocol - Tracheostomy per ENT  - ended solumedrol for upper airway edema 08/25/19 - Pulmicort neb 0.81m q12h + duonebs bid   Acute metabolic toxic encephalopathy Secondary to versed gtt. Plan to discontinue gtt today. RASS goal 0 to -1 - Seroquel 1028mbid, titrate as needed - Versed 48m648mrn  Diabetes mellitus  Improved glycemic control  - Detemir 45U + Novolog 10U + SSI - CBG monitoring  Normocytic anemia No signs of active bleeding, likely secondary to acute infection - Continue to monitor   Best practice:  Diet: tube feeds Pain/Anxiety/Delirium protocol (if indicated): per protocol VAP protocol (if indicated): per protocol DVT prophylaxis: Lovenox GI prophylaxis: PPI Glucose control: Levemir + SSI Mobility: Bedrest Code Status: FULL Family Communication: daily updates to patient's sister, DiaTheresa Dutyisposition: ICU  Critical care time: 33 minutes    Critical care time was exclusive of separately billable procedures and treating other patients.  Critical care was necessary to treat or prevent imminent or life-threatening deterioration.  Critical care was time spent personally by me on the following activities: development of treatment plan with patient and/or surrogate as well as nursing, discussions with consultants, evaluation of patient's response to treatment, examination of patient, obtaining history from patient or surrogate, ordering and performing treatments and interventions, ordering and review of laboratory studies, ordering and review of radiographic studies,  pulse oximetry, re-evaluation of patient's condition and participation in multidisciplinary rounds.

## 2019-08-26 NOTE — Progress Notes (Signed)
eLink Physician-Brief Progress Note Patient Name: Timothy Le DOB: Jan 30, 1960 MRN: [id number]   Date of Service  08/26/2019  HPI/Events of Note  Sinus bradycardia on 0.4 mcg of Precedex, patient is adequately sedated.  eICU Interventions  RN instructed to wean Precedex down to 0.2 mcg as tolerated.        Leul Narramore U Lloyde [place] 08/26/2019, 12:30 AM

## 2019-08-26 NOTE — Progress Notes (Signed)
[place] Progress Note Patient Name: Timothy Le DOB: 1960-02-06 MRN: [id number]   Date of Service  08/26/2019  HPI/Events of Note  Request for repeat labs as K and Mg low this morning and reportedly nor replacement given  eICU Interventions  Repeat BMP and Mg level     Intervention Category Major Interventions: Electrolyte abnormality - evaluation and management  Judd Lien 08/26/2019, 10:43 PM

## 2019-08-27 LAB — BASIC METABOLIC PANEL
Anion gap: 8 (ref 5–15)
Anion gap: 10 (ref 5–15)
BUN: 34 mg/dL — ABNORMAL HIGH (ref 6–20)
BUN: 35 mg/dL — ABNORMAL HIGH (ref 6–20)
CO2: 30 mmol/L (ref 22–32)
CO2: 29 mmol/L (ref 22–32)
Calcium: 8.9 mg/dL (ref 8.9–10.3)
Calcium: 8.7 mg/dL — ABNORMAL LOW (ref 8.9–10.3)
Chloride: 102 mmol/L (ref 98–111)
Chloride: 102 mmol/L (ref 98–111)
Creatinine, Ser: 0.75 mg/dL (ref 0.61–1.24)
Creatinine, Ser: 0.75 mg/dL (ref 0.61–1.24)
GFR calc Af Amer: >60 mL/min (ref >60)
GFR calc Af Amer: >60 mL/min (ref >60)
GFR calc non Af Amer: >60 mL/min (ref >60)
GFR calc non Af Amer: >60 mL/min (ref >60)
Glucose, Bld: 86 mg/dL (ref 70–99)
Glucose, Bld: 105 mg/dL — ABNORMAL HIGH (ref 70–99)
Potassium: 3.4 mmol/L — ABNORMAL LOW (ref 3.5–5.1)
Potassium: 3.3 mmol/L — ABNORMAL LOW (ref 3.5–5.1)
Sodium: 140 mmol/L (ref 135–145)
Sodium: 141 mmol/L (ref 135–145)

## 2019-08-27 LAB — MAGNESIUM
Magnesium: 1.5 mg/dL — ABNORMAL LOW (ref 1.7–2.4)
Magnesium: 1.6 mg/dL — ABNORMAL LOW (ref 1.7–2.4)

## 2019-08-27 LAB — GLUCOSE, CAPILLARY
Glucose-Capillary: 125 mg/dL — ABNORMAL HIGH (ref 70–99)
Glucose-Capillary: 126 mg/dL — ABNORMAL HIGH (ref 70–99)
Glucose-Capillary: 126 mg/dL — ABNORMAL HIGH (ref 70–99)
Glucose-Capillary: 64 mg/dL — ABNORMAL LOW (ref 70–99)
Glucose-Capillary: 84 mg/dL (ref 70–99)
Glucose-Capillary: 89 mg/dL (ref 70–99)
Glucose-Capillary: 95 mg/dL (ref 70–99)

## 2019-08-27 LAB — PHOSPHORUS: Phosphorus: 3 mg/dL (ref 2.5–4.6)

## 2019-08-27 MED ORDER — POTASSIUM CHLORIDE 20 MEQ/15ML (10%) PO SOLN
40.0000 meq | Freq: Once | ORAL | Status: AC
  Administered 2019-08-27: 40 meq
  Filled 2019-08-27: qty 30

## 2019-08-27 MED ORDER — MAGNESIUM SULFATE IN D5W 1-5 GM/100ML-% IV SOLN
1.0000 g | Freq: Once | INTRAVENOUS | Status: AC
  Administered 2019-08-27: 1 g via INTRAVENOUS
  Filled 2019-08-27: qty 100

## 2019-08-27 MED ORDER — POTASSIUM CHLORIDE 20 MEQ PO PACK
40.0000 meq | PACK | Freq: Once | ORAL | Status: AC
  Administered 2019-08-27: 40 meq
  Filled 2019-08-27: qty 2

## 2019-08-27 MED ORDER — MAGNESIUM SULFATE 50 % IJ SOLN
3.0000 g | Freq: Once | INTRAVENOUS | Status: AC
  Administered 2019-08-27: 3 g via INTRAVENOUS
  Filled 2019-08-27: qty 6

## 2019-08-27 MED ORDER — HYDROMORPHONE HCL 1 MG/ML IJ SOLN
1.0000 mg | INTRAMUSCULAR | Status: DC | PRN
  Administered 2019-08-27 – 2019-08-30 (×12): 1 mg via INTRAVENOUS
  Filled 2019-08-27 (×12): qty 1

## 2019-08-27 MED ORDER — ATROPINE SULFATE 1 MG/10ML IJ SOSY
PREFILLED_SYRINGE | INTRAMUSCULAR | Status: AC
  Filled 2019-08-27: qty 10

## 2019-08-27 MED ORDER — DEXTROSE 50 % IV SOLN
INTRAVENOUS | Status: AC
  Administered 2019-08-27: 50 mL
  Filled 2019-08-27: qty 50

## 2019-08-27 MED ORDER — INSULIN DETEMIR 100 UNIT/ML ~~LOC~~ SOLN
35.0000 [IU] | Freq: Two times a day (BID) | SUBCUTANEOUS | Status: DC
  Administered 2019-08-27 – 2019-09-06 (×14): 35 [IU] via SUBCUTANEOUS
  Filled 2019-08-27 (×23): qty 0.35

## 2019-08-27 NOTE — Progress Notes (Signed)
NAME:  Timothy Le, MRN:  [id number], DOB:  [birth year], LOS: 44 ADMISSION DATE:  07/30/2019, CONSULTATION DATE:  7/11 REFERRING MD:  Kathrynn Humble, CHIEF COMPLAINT:  Dyspnea   Brief History   59 year old male with baseline chronic respiratory failure on the basis of severe OHS/OSA complicated further by significant obstructive disease in the setting of COPD. Presents with acute on chronic hypoxic and hypercarbic respiratory failure. Admitted with the initial working diagnosis of pulmonary edema and decompensated diastolic heart failure. Per family patient was without CPAP for 2 weeks PTA as his machine broke. Has required mechanical ventilation for most of the hospitalization, extubated on 7/16, had stridor, unsuccessful re-intubation, had emergent tracheostomy which was difficult.  On 7/24 he became agitated and his tracheostomy dislodged, required emergent re-intubation.  Past Medical History   Past Medical History:  Diagnosis Date  . Asthma   . Chronic respiratory failure with hypoxia ([place]) 07/09/2017  . COPD (chronic obstructive pulmonary disease) ([place])   . Diabetes mellitus without complication ([place])   . Difficult intubation   . Hypertension   . Shortness of breath dyspnea   . Sleep apnea    Significant Hospital Events   7/15-7/16> desat to 70s that resolved with suctioning; febrile  7/16> unsuccessful extubation due to stridor requiring emergent tracheostomy after unsuccessful re-intubation; noted to have R lung atelectasis 7/23> bronch mucus plug 7/24> tracheostomy dislodged due to severe agitation; required emergent reintubation 7/26> tracheostomy revision with ENT 7/30> ETT due to cuff leak  Consults:  ENT   Procedures:  ET tube 7/11>7/16, 7/24> 7/26, 7/30> Trach 7/16>7/25 R IJ 7/11>   Significant Diagnostic Tests:  Echocardiogram 7/11 >> no evidence of RV strain; LVEF 50-55%.Left ventricular diastolic function could not be evaluated. Lower extremity ultrasound 7/11  >> no evidence of lower extremity DVT MRI brain 7/20 > normal MRI brain, extensive sinus mucosal disease, bilateral mastoid effusion, bilateral proptosis  Micro Data:  7/11 SARS COV 2> negative 7/13 resp culture > OPF 7/23 BAL LLL >staph epi  Antimicrobials:  Pip/tazo - 7/16>7/21 Cefepime 7/24 > 7/25 Ceftriaxone 7/24 > 7/27   Interim history/subjective:  No acute overnight events. Remains mechanically intubated, tolerating pressure support throughout the day. Sugars trending down, decreasing insulin amount.  Objective   Blood pressure (!) 107/59, pulse (!) 54, temperature 97.9 F (36.6 C), temperature source Oral, resp. rate 15, height 6' (1.829 m), weight (!) 146.3 kg, SpO2 95 %.    Vent Mode: PRVC FiO2 (%):  [30 %] 30 % Set Rate:  [15 bmp-18 bmp] 15 bmp Vt Set:  [470 mL] 470 mL PEEP:  [8 cmH20] 8 cmH20 Plateau Pressure:  [17 cmH20-19 cmH20] 19 cmH20   Intake/Output Summary (Last 24 hours) at 08/27/2019 1143 Last data filed at 08/27/2019 0900 Gross per 24 hour  Intake 3034.04 ml  Output 400 ml  Net 2634.04 ml   Filed Weights   08/25/19 0438 08/26/19 0500 08/27/19 0423  Weight: (!) 144.8 kg (!) 148.2 kg (!) 146.3 kg    Examination: General: obese male, mechanically ventilated, no acute distress  HENT: [place]/AT, PERRL, EOMI, ETT in place, anicteric sclerae; occlusive bandage over prior tracheostomy site  Lungs: distant lung sounds due to body habitus; mild rhonchi bilaterally with slightly diminished bibasilar breath sounds  Cardiovascular: RRR, no m/r/g, distal pulses intact Abdomen: soft, no guarding, normoactive bowel sounds Extremities: warm and dry, non pitting bilateral lower extremity edema  Neuro: awake, intermittently following commands; PERRL, moving extremities spontaneously, no apparent focal deficits noted  Assessment & Plan:  Acute on chronic hypoxic and hypercarbic respiratory failure Initially admitted for diastolic heart failure exacerbation and has  remained mostly mechanically vented throughout admission; prior tracheostomies have been unsuccessful with most recent noted to have cuff leak on 7/30 requiring reintubation.  Patient is scheduled for  total thyroidectomy and cervical lipectomy with suturing of skin flaps to to trachea for tracheostomy on 8/11 with ENT.  - Continue vent support,  PSV as tolerated  - VAP prevention protocol - Tracheostomy per ENT  - ended solumedrol for upper airway edema 08/25/19 - Pulmicort neb 0.91m q12h + duonebs bid   Acute metabolic toxic encephalopathy - RASS goal 0 to -1 - Seroquel 1040mbid, precedex, titrate as needed - Versed 53m36mrn  Diabetes mellitus  Glycemic control too tight - Decrease Detemir 35u, continue Novolog 10U + SSI - CBG monitoring  Normocytic anemia No signs of active bleeding, likely secondary to acute infection - Continue to monitor   Best practice:  Diet: tube feeds Pain/Anxiety/Delirium protocol (if indicated): per protocol VAP protocol (if indicated): per protocol DVT prophylaxis: Lovenox GI prophylaxis: PPI Glucose control: Levemir + SSI Mobility: Bedrest Code Status: FULL Family Communication: daily updates to patient's sister, DiaTheresa Dutyisposition: ICU  Critical care time: 30 minutes    Critical care time was exclusive of separately billable procedures and treating other patients.  Critical care was necessary to treat or prevent imminent or life-threatening deterioration.  Critical care was time spent personally by me on the following activities: development of treatment plan with patient and/or surrogate as well as nursing, discussions with consultants, evaluation of patient's response to treatment, examination of patient, obtaining history from patient or surrogate, ordering and performing treatments and interventions, ordering and review of laboratory studies, ordering and review of radiographic studies, pulse oximetry, re-evaluation of patient's  condition and participation in multidisciplinary rounds.

## 2019-08-27 NOTE — Progress Notes (Signed)
[facility] Progress Note Patient Name: Timothy Le DOB: Dec 30, 1960 MRN: [id number]   Date of Service  08/27/2019  HPI/Events of Note  K 3.4, Mg 1.5  eICU Interventions  Ordered K 40 meqs per OG and Magnesium sulfate 1 g     Intervention Category Major Interventions: Electrolyte abnormality - evaluation and management  Judd Lien 08/27/2019, 12:25 AM

## 2019-08-28 ENCOUNTER — Inpatient Hospital Stay (HOSPITAL_COMMUNITY): Payer: Medicaid Other

## 2019-08-28 DIAGNOSIS — J9621 Acute and chronic respiratory failure with hypoxia: Secondary | ICD-10-CM | POA: Diagnosis not present

## 2019-08-28 DIAGNOSIS — J9622 Acute and chronic respiratory failure with hypercapnia: Secondary | ICD-10-CM | POA: Diagnosis not present

## 2019-08-28 LAB — MAGNESIUM: Magnesium: 1.8 mg/dL (ref 1.7–2.4)

## 2019-08-28 LAB — GLUCOSE, CAPILLARY
Glucose-Capillary: 98 mg/dL (ref 70–99)
Glucose-Capillary: 126 mg/dL — ABNORMAL HIGH (ref 70–99)
Glucose-Capillary: 104 mg/dL — ABNORMAL HIGH (ref 70–99)
Glucose-Capillary: 111 mg/dL — ABNORMAL HIGH (ref 70–99)
Glucose-Capillary: 92 mg/dL (ref 70–99)
Glucose-Capillary: 132 mg/dL — ABNORMAL HIGH (ref 70–99)

## 2019-08-28 LAB — CBC
HCT: 30.8 % — ABNORMAL LOW (ref 39.0–52.0)
Hemoglobin: 9.2 g/dL — ABNORMAL LOW (ref 13.0–17.0)
MCH: 28.2 pg (ref 26.0–34.0)
MCHC: 29.9 g/dL — ABNORMAL LOW (ref 30.0–36.0)
MCV: 94.5 fL (ref 80.0–100.0)
Platelets: 251 10*3/uL (ref 150–400)
RBC: 3.26 MIL/uL — ABNORMAL LOW (ref 4.22–5.81)
RDW: 17.7 % — ABNORMAL HIGH (ref 11.5–15.5)
WBC: 7.3 10*3/uL (ref 4.0–10.5)
nRBC: 0 % (ref 0.0–0.2)

## 2019-08-28 LAB — BASIC METABOLIC PANEL
Anion gap: 7 (ref 5–15)
BUN: 36 mg/dL — ABNORMAL HIGH (ref 6–20)
CO2: 30 mmol/L (ref 22–32)
Calcium: 8.5 mg/dL — ABNORMAL LOW (ref 8.9–10.3)
Chloride: 101 mmol/L (ref 98–111)
Creatinine, Ser: 0.75 mg/dL (ref 0.61–1.24)
GFR calc Af Amer: >60 mL/min (ref >60)
GFR calc non Af Amer: >60 mL/min (ref >60)
Glucose, Bld: 220 mg/dL — ABNORMAL HIGH (ref 70–99)
Potassium: 3.4 mmol/L — ABNORMAL LOW (ref 3.5–5.1)
Sodium: 138 mmol/L (ref 135–145)

## 2019-08-28 MED ORDER — DEXTROSE 5 % IV SOLN: INTRAVENOUS | Status: AC

## 2019-08-28 MED ORDER — INSULIN ASPART 100 UNIT/ML ~~LOC~~ SOLN
5.0000 [IU] | SUBCUTANEOUS | Status: DC
  Administered 2019-08-28 – 2019-09-10 (×43): 5 [IU] via SUBCUTANEOUS

## 2019-08-28 MED ORDER — MIDAZOLAM HCL 2 MG/2ML IJ SOLN
INTRAMUSCULAR | Status: AC
  Filled 2019-08-28: qty 4

## 2019-08-28 MED ORDER — FENTANYL CITRATE (PF) 100 MCG/2ML IJ SOLN
INTRAMUSCULAR | Status: AC
  Filled 2019-08-28: qty 2

## 2019-08-28 MED ORDER — SODIUM CHLORIDE 3 % IN NEBU
4.0000 mL | INHALATION_SOLUTION | Freq: Every day | RESPIRATORY_TRACT | Status: AC
  Administered 2019-08-28 – 2019-08-30 (×3): 4 mL via RESPIRATORY_TRACT
  Filled 2019-08-28 (×3): qty 4

## 2019-08-28 MED ORDER — POTASSIUM CHLORIDE 20 MEQ/15ML (10%) PO SOLN
40.0000 meq | Freq: Once | ORAL | Status: AC
  Administered 2019-08-28: 40 meq
  Filled 2019-08-28: qty 30

## 2019-08-28 MED ORDER — MAGNESIUM SULFATE 2 GM/50ML IV SOLN
2.0000 g | Freq: Once | INTRAVENOUS | Status: AC
  Administered 2019-08-28: 2 g via INTRAVENOUS
  Filled 2019-08-28: qty 50

## 2019-08-28 MED FILL — Medication: Qty: 1 | Status: AC

## 2019-08-28 NOTE — Progress Notes (Addendum)
NAME:  Timothy Le, MRN:  [id number], DOB:  [birth year], LOS: 27 ADMISSION DATE:  07/30/2019, CONSULTATION DATE:  7/11 REFERRING MD:  Kathrynn Humble, CHIEF COMPLAINT:  Dyspnea   Brief History   59 year old male with baseline chronic respiratory failure on the basis of severe OHS/OSA complicated further by significant obstructive disease in the setting of COPD. Presents with acute on chronic hypoxic and hypercarbic respiratory failure. Admitted with the initial working diagnosis of pulmonary edema and decompensated diastolic heart failure. Per family patient was without CPAP for 2 weeks PTA as his machine broke. Has required mechanical ventilation for most of the hospitalization, extubated on 7/16, had stridor, unsuccessful re-intubation, had emergent tracheostomy which was difficult.  On 7/24 he became agitated and his tracheostomy dislodged, required emergent re-intubation.  Past Medical History   Past Medical History:  Diagnosis Date  . Asthma   . Chronic respiratory failure with hypoxia ([place]) 07/09/2017  . COPD (chronic obstructive pulmonary disease) ([place])   . Diabetes mellitus without complication ([place])   . Difficult intubation   . Hypertension   . Shortness of breath dyspnea   . Sleep apnea    Significant Hospital Events   7/15-7/16> desat to 70s that resolved with suctioning; febrile  7/16> unsuccessful extubation due to stridor requiring emergent tracheostomy after unsuccessful re-intubation; noted to have R lung atelectasis 7/23> bronch mucus plug 7/24> tracheostomy dislodged due to severe agitation; required emergent reintubation 7/26> tracheostomy revision with ENT 7/30> ETT due to cuff leak  Consults:  ENT   Procedures:  ET tube 7/11>7/16, 7/24> 7/26, 7/30> Trach 7/16>7/25 R IJ 7/11>   Significant Diagnostic Tests:  Echocardiogram 7/11 >> no evidence of RV strain; LVEF 50-55%.Left ventricular diastolic function could not be evaluated. Lower extremity ultrasound 7/11  >> no evidence of lower extremity DVT MRI brain 7/20 > normal MRI brain, extensive sinus mucosal disease, bilateral mastoid effusion, bilateral proptosis  Micro Data:  7/11 SARS COV 2> negative 7/13 resp culture > OPF 7/23 BAL LLL >staph epi  Antimicrobials:  Pip/tazo - 7/16>7/21 Cefepime 7/24 > 7/25 Ceftriaxone 7/24 > 7/27   Interim history/subjective:  Patient blood sugar 64 overnight, started on D5 that was d/c this AM after improved BG of 134. Remains mechanically intubated, tolerating pressure support throughout the day.   Objective   Blood pressure 139/81, pulse 61, temperature 98.3 F (36.8 C), temperature source Oral, resp. rate 17, height 6' (1.829 m), weight (!) 145.1 kg, SpO2 99 %.    Vent Mode: PRVC FiO2 (%):  [30 %] 30 % Set Rate:  [15 bmp] 15 bmp Vt Set:  [470 mL] 470 mL PEEP:  [8 cmH20] 8 cmH20 Plateau Pressure:  [18 cmH20-20 cmH20] 18 cmH20   Intake/Output Summary (Last 24 hours) at 08/28/2019 0093 Last data filed at 08/28/2019 0700 Gross per 24 hour  Intake 1833.71 ml  Output 1300 ml  Net 533.71 ml   Filed Weights   08/26/19 0500 08/27/19 0423 08/28/19 0120  Weight: (!) 148.2 kg (!) 146.3 kg (!) 145.1 kg    Examination: General: obese male, mechanically ventilated, no acute distress  HENT: [place]/AT, PERRL, EOMI, ETT in place, anicteric sclerae; occlusive bandage over prior tracheostomy site  Lungs: distant lung sounds due to body habitus Cardiovascular: RRR, no m/r/g, distal pulses intact Abdomen: soft, non-tender, no guarding, normoactive bowel sounds Extremities: warm and dry Neuro: awake, following commands, nods appropriately to answer questions, PERRL, moving extremities spontaneously, no apparent focal deficits noted   Assessment & Plan:  Acute  on chronic hypoxic and hypercarbic respiratory failure Initially admitted for diastolic heart failure exacerbation and has remained mostly mechanically vented throughout admission; prior tracheostomies have  been unsuccessful with most recent noted to have cuff leak on 7/30 requiring reintubation.  Patient is scheduled for  total thyroidectomy and cervical lipectomy with suturing of skin flaps to to trachea for tracheostomy on 8/11 with ENT.  - Continue vent support,  PSV as tolerated  - VAP prevention protocol - Tracheostomy per ENT  - ended solumedrol for upper airway edema 08/25/19 - PRN duonebs bid   Acute metabolic toxic encephalopathy - RASS goal 0 to -1 - Seroquel 143m bid - D/c precedex this AM (secondary to bradycardia overnight) - Versed 276mprn  Diabetes mellitus  Glycemic control too tight, hypoglycemic overnight to 64. Novolog 10U held this AM only 3units SSI given.  - Decreased Detemir 35u, with  - Decrease Novolog to 5U +SSI - CBG monitoring  Normocytic anemia No signs of active bleeding, likely secondary to acute infection - Hgb has been stable around 9.4 - Continue to monitor   Best practice:  Diet: tube feeds Pain/Anxiety/Delirium protocol (if indicated): per protocol VAP protocol (if indicated): per protocol DVT prophylaxis: Lovenox GI prophylaxis: PPI Glucose control: Levemir + SSI Mobility: Bedrest Code Status: FULL Family Communication: daily updates to patient's sister, DiTheresa DutyDisposition: ICU  Critical care time: 30 minutes    Critical care time was exclusive of separately billable procedures and treating other patients.  Critical care was necessary to treat or prevent imminent or life-threatening deterioration.  Critical care was time spent personally by me on the following activities: development of treatment plan with patient and/or surrogate as well as nursing, discussions with consultants, evaluation of patient's response to treatment, examination of patient, obtaining history from patient or surrogate, ordering and performing treatments and interventions, ordering and review of laboratory studies, ordering and review of radiographic  studies, pulse oximetry, re-evaluation of patient's condition and participation in multidisciplinary rounds.   Attending note: I have seen and examined the patient. History, labs and imaging reviewed.  [age]ear old with severe OSA/OHS, presenting with acute on chronic hypoxic, hypercarbic respiratory failure failed extubation requiring emergent tracheostomy.  Now intubated by ETT due to significant cuff leak from tracheostomy Remains on the ventilator over the weekend with no significant issues.  Blood pressure 132/68, pulse 84, temperature 98.3 F (36.8 C), temperature source Oral, resp. rate (!) 25, height 6' (1.829 m), weight (!) 145.1 kg, SpO2 98 %. Gen:      No acute distress HEENT:  EOMI, sclera anicteric Neck:     No masses; no thyromegaly Lungs:    Clear to auscultation bilaterally; normal respiratory effort CV:         Regular rate and rhythm; no murmurs Abd:      + bowel sounds; soft, non-tender; no palpable masses, no distension Ext:    No edema; adequate peripheral perfusion Skin:      Warm and dry; no rash Neuro: alert and oriented x 3 Psych: normal mood and affect   Labs/Imaging personally reviewed, significant for Potassium 3.4, BUN/creatinine 36/0.75 WBC 7.3, hemoglobin 9.2, platelets 251 No new imaging  Assessment/plan: Acute on chronic respiratory failure Continue ET tube Tentative plan for total thyroidectomy and cervical lipectomy and tracheostomy revision by ENT later this week Continue nebs Seroquel, Precedex for agitation  The patient is critically ill with multiple organ systems failure and requires high complexity decision making for assessment and support, frequent evaluation  and titration of therapies, application of advanced monitoring technologies and extensive interpretation of multiple databases.  Critical care time - 35 mins. This represents my time independent of the NPs time taking care of the pt.  Marshell Garfinkel MD [facility] 08/28/2019, 11:04 AM

## 2019-08-28 NOTE — Progress Notes (Addendum)
I&O cath for 900 mls

## 2019-08-28 NOTE — Progress Notes (Signed)
Spoke with patients sister, [name] [phone]) concerning her brothers episode of bradycardia today with code blue called. Described event and that patient was able to return to stable vitals after dose of atropine. Sister states that this has happened multiple times and appreciated update. Confirmed that plan is still for patient to undergo tracheostomy on 08/11 per ENT. No further questions.   [address] [place], [place] 08/28/2019,1:44 PM

## 2019-08-28 NOTE — Code Documentation (Signed)
  Patient Name: Timothy Le   MRN: [id number]   Date of Birth/ Sex: [birth year] , male      Admission Date: 07/30/2019  Attending Provider: Marshell Garfinkel, MD  Primary Diagnosis: <principal problem not specified>   Indication: Pt was in his usual state of health until this PM, when he was noted to be Bradycardic . Code blue was subsequently called. At the time of arrival on scene, ACLS protocol was underway.   Technical Description:  - CPR performance duration:  0 minute  - Was defibrillation or cardioversion used? No   - Was external pacer placed? No  - Was patient intubated pre/post CPR? Yes, already intubated   Medications Administered: Y = Yes; Blank = No Amiodarone    Atropine  Y  Calcium    Epinephrine    Lidocaine    Magnesium    Norepinephrine    Phenylephrine    Sodium bicarbonate    Vasopressin     Post CPR evaluation:  - Final Status - Was patient successfully resuscitated ? Yes - What is current rhythm? Sinus Tachycardia - What is current hemodynamic status? stable  Miscellaneous Information:  - Labs sent, including: CXR  - Primary team notified?  Yes  - Family Notified? Yes  - Additional notes/ transfer status: Patient found to bradycardic into the mid teens with O2 saturations at 50%. , with no palpable femoral pulse. Rapid was called, and patient given one dose of atropine while pads were being located. Patient immediately came back with HR of 155 in sinus tachycardia, and femoral pulse was palpable. PCCM aware.      Maudie Mercury, MD  08/28/2019, 1:13 PM

## 2019-08-28 NOTE — Progress Notes (Signed)
   08/28/19 1200  Clinical Encounter Type  Visited With Health care provider  Visit Type Code  Referral From Nurse  Consult/Referral To Chaplain   Chaplain responded to code blue. No family present. Chaplains remain available for support as needs arise.   Chaplain Resident, Evelene Croon, M Div [phone] on-call pager

## 2019-08-28 NOTE — Progress Notes (Addendum)
Nutrition Follow-up  DOCUMENTATION CODES:   Morbid obesity  INTERVENTION:   Tube Feeding via Cortrak:  Vital High Protein at 65 ml/hr Pro-Source TF 45 mL TID Provides 1680 kcals, 170 g of protein and 1310 mL of free water Meets 100% protein and calorie needs  NUTRITION DIAGNOSIS:   Inadequate oral intake related to inability to eat as evidenced by NPO status.  Ongoing  GOAL:   Patient will meet greater than or equal to 90% of their needs  Met with TF  MONITOR:   TF tolerance, Vent status, Weight trends, Labs, I & O's  REASON FOR ASSESSMENT:   Consult Enteral/tube feeding initiation and management  ASSESSMENT:   Pt with baseline chronic respiratory failure on the basis of severe OHS/OSA complicated further by significant obstructive disease in the setting of COPD.  Presents with acute on chronic hypoxic and hypercarbic respiratory failure.  Admitted with the initial working diagnosis of pulmonary edema and decompensated diastolic heart failure. PMH includes HTN, COPD, DM.  7/11 Intubated 7/16Extubated but developed stridor, attempted re-intubation unsuccessful, emergentTrach placed 7/21 Cortrak placed 7/23 Bronch, mucous plugg 7/24 Trach dislodged, emergently re-intubated 7/26 ENT replaced trach in OR 7/30 significant trach cuff leak, required oral intubation  Patient remains on ventilator support. MV: 13.1 L/min Temp (24hrs), Avg:98.2 F (36.8 C), Min:97.8 F (36.6 C), Max:98.5 F (36.9 C)  Reviewed I/O's: +713 ml x 24 hours and -2.5 L since 08/14/19  UOP: 1.1 L x 24 hours  Rectal tube output: 200 ml x 24 hours  Pt alert at time of visit. He shook his head when asked if he was in pain or had any abdominal pain or nausea/vomiting.   TF continues to infuse via cortrak tube: Vital High protein @ 65 ml/hr with 45 ml Prosource TF TID. Regimen providing 1680 kcals, 170 g of protein and 1310 mL of free water, which meets 100% of estimated kcal and protein  needs.   Wt has been stable over the past week.   Per MD notes, pt scheduled for total thyroidectomy and cervical lipectomy with suturing of skin flaps to to trachea for tracheostomy with ENT on Wednesday (08/30/19).   Medications reviewed and include colace, melatonin, miralax, dextrose 5% solution @ 30 ml/hr  Labs reviewed: K: 3.4, CBGS: 64-125 (inpatient orders for glycemic control are 0-20 units insulin aspart every 4 hours, 10 units insulin aspart every 4 hours, and 35 units insulin detemir BID).   Diet Order:   Diet Order    None      EDUCATION NEEDS:   No education needs have been identified at this time  Skin:  Skin Assessment: Skin Integrity Issues: Skin Integrity Issues:: Stage II Stage II: buttock  Last BM:  08/28/19 (100 ml via rectal tube)  Height:   Ht Readings from Last 1 Encounters:  07/30/19 6' (1.829 m)    Weight:   Wt Readings from Last 1 Encounters:  08/28/19 (!) 145.1 kg    Ideal Body Weight:  80.9 kg  BMI:  Body mass index is 43.38 kg/m.  Estimated Nutritional Needs:   Kcal:  2707-8675  Protein:  160-200  Fluid:  >1.6L/d    Timothy Le, RD, LDN, [place] Registered Dietitian II Certified Diabetes Care and Education Specialist Please refer to [facility] for RD and/or RD on-call/weekend/after hours pager

## 2019-08-28 NOTE — Progress Notes (Signed)
[facility] ADULT ICU REPLACEMENT PROTOCOL   The patient does apply for the [facility] Adult ICU Electrolyte Replacment Protocol based on the criteria listed below:   1. Is GFR >/= 30 ml/min? Yes.    Patient's GFR today is >60 2. Is SCr </= 2? Yes.   Patient's SCr is 0.75 ml/kg/hr 3. Did SCr increase >/= 0.5 in 24 hours? No. 4. Abnormal electrolyte(s): K+3.4, Mag 1.8 5. Ordered repletion with: protocol 6. If a panic level lab has been reported, has the CCM MD in charge been notified? Yes.  .   Physician:  Dr. Otilio Jefferson, [place] Grumbling 08/28/2019 6:10 AM

## 2019-08-28 NOTE — Progress Notes (Signed)
[facility] Progress Note Patient Name: [name] DOB: 1960/10/28 MRN: [id number]   Date of Service  08/28/2019  HPI/Events of Note  Oliguria - Bladder scan with 500 mL residual.  This is the third bladder scan with significant residual in last 24 hours. Nursing request for Foley catheter.   eICU Interventions  Plan: 1. Place Foley catheter.      Intervention Category Major Interventions: Other:  Pearlie Lafosse Cornelia Copa 08/28/2019, 8:00 PM

## 2019-08-28 NOTE — Progress Notes (Signed)
[facility]Link Physician-Brief Progress Note Patient Name: Timothy Le DOB: 1960/09/22 MRN: [id number]   Date of Service  08/28/2019  HPI/Events of Note  Scan > 570 BG 64, got D 50.  eICU Interventions  -I/O cath -D5 at 30 ml/hr.      Intervention Category Intermediate Interventions: Other:  Elmer Sow 08/28/2019, 12:10 AM

## 2019-08-29 DIAGNOSIS — J9622 Acute and chronic respiratory failure with hypercapnia: Secondary | ICD-10-CM | POA: Diagnosis not present

## 2019-08-29 DIAGNOSIS — J9621 Acute and chronic respiratory failure with hypoxia: Secondary | ICD-10-CM | POA: Diagnosis not present

## 2019-08-29 LAB — BASIC METABOLIC PANEL
Anion gap: 9 (ref 5–15)
BUN: 30 mg/dL — ABNORMAL HIGH (ref 6–20)
CO2: 28 mmol/L (ref 22–32)
Calcium: 8.5 mg/dL — ABNORMAL LOW (ref 8.9–10.3)
Chloride: 102 mmol/L (ref 98–111)
Creatinine, Ser: 0.71 mg/dL (ref 0.61–1.24)
GFR calc Af Amer: >60 mL/min (ref >60)
GFR calc non Af Amer: >60 mL/min (ref >60)
Glucose, Bld: 118 mg/dL — ABNORMAL HIGH (ref 70–99)
Potassium: 3.8 mmol/L (ref 3.5–5.1)
Sodium: 139 mmol/L (ref 135–145)

## 2019-08-29 LAB — CBC
HCT: 30.3 % — ABNORMAL LOW (ref 39.0–52.0)
Hemoglobin: 9 g/dL — ABNORMAL LOW (ref 13.0–17.0)
MCH: 28.3 pg (ref 26.0–34.0)
MCHC: 29.7 g/dL — ABNORMAL LOW (ref 30.0–36.0)
MCV: 95.3 fL (ref 80.0–100.0)
Platelets: 209 10*3/uL (ref 150–400)
RBC: 3.18 MIL/uL — ABNORMAL LOW (ref 4.22–5.81)
RDW: 18 % — ABNORMAL HIGH (ref 11.5–15.5)
WBC: 6.8 10*3/uL (ref 4.0–10.5)
nRBC: 0.3 % — ABNORMAL HIGH (ref 0.0–0.2)

## 2019-08-29 LAB — GLUCOSE, CAPILLARY
Glucose-Capillary: 111 mg/dL — ABNORMAL HIGH (ref 70–99)
Glucose-Capillary: 138 mg/dL — ABNORMAL HIGH (ref 70–99)
Glucose-Capillary: 106 mg/dL — ABNORMAL HIGH (ref 70–99)
Glucose-Capillary: 121 mg/dL — ABNORMAL HIGH (ref 70–99)
Glucose-Capillary: 92 mg/dL (ref 70–99)
Glucose-Capillary: 117 mg/dL — ABNORMAL HIGH (ref 70–99)

## 2019-08-29 LAB — MAGNESIUM: Magnesium: 1.6 mg/dL — ABNORMAL LOW (ref 1.7–2.4)

## 2019-08-29 MED ORDER — ENOXAPARIN SODIUM 80 MG/0.8ML ~~LOC~~ SOLN
0.5000 mg/kg | Freq: Once | SUBCUTANEOUS | Status: AC
  Administered 2019-08-29: 75 mg via SUBCUTANEOUS
  Filled 2019-08-29: qty 0.8

## 2019-08-29 MED ORDER — MAGNESIUM SULFATE 4 GM/100ML IV SOLN
4.0000 g | Freq: Once | INTRAVENOUS | Status: AC
  Administered 2019-08-29: 4 g via INTRAVENOUS
  Filled 2019-08-29: qty 100

## 2019-08-29 NOTE — Progress Notes (Addendum)
NAME:  Timothy Le, MRN:  [id number], DOB:  [birth year], LOS: 81 ADMISSION DATE:  07/30/2019, CONSULTATION DATE:  7/11 REFERRING MD:  Kathrynn Humble, CHIEF COMPLAINT:  Dyspnea   Brief History   59 year old male with baseline chronic respiratory failure on the basis of severe OHS/OSA complicated further by significant obstructive disease in the setting of COPD. Presents with acute on chronic hypoxic and hypercarbic respiratory failure. Admitted with the initial working diagnosis of pulmonary edema and decompensated diastolic heart failure. Per family patient was without CPAP for 2 weeks PTA as his machine broke. Has required mechanical ventilation for most of the hospitalization, extubated on 7/16, had stridor, unsuccessful re-intubation, had emergent tracheostomy which was difficult.  On 7/24 he became agitated and his tracheostomy dislodged, required emergent re-intubation.  Past Medical History   Past Medical History:  Diagnosis Date  . Asthma   . Chronic respiratory failure with hypoxia ([place]) 07/09/2017  . COPD (chronic obstructive pulmonary disease) ([place])   . Diabetes mellitus without complication ([place])   . Difficult intubation   . Hypertension   . Shortness of breath dyspnea   . Sleep apnea    Significant Hospital Events   7/15-7/16> desat to 70s that resolved with suctioning; febrile  7/16> unsuccessful extubation due to stridor requiring emergent tracheostomy after unsuccessful re-intubation; noted to have R lung atelectasis 7/23> bronch mucus plug 7/24> tracheostomy dislodged due to severe agitation; required emergent reintubation 7/26> tracheostomy revision with ENT 7/30> ETT due to cuff leak 8/09>bronch mucus plug resulting in bradycardia and code blue  Consults:  ENT   Procedures:  ET tube 7/11>7/16, 7/24> 7/26, 7/30> Trach 7/16>7/25 R IJ 7/11>   Significant Diagnostic Tests:  Echocardiogram 7/11 >> no evidence of RV strain; LVEF 50-55%.Left ventricular diastolic  function could not be evaluated. Lower extremity ultrasound 7/11 >> no evidence of lower extremity DVT MRI brain 7/20 > normal MRI brain, extensive sinus mucosal disease, bilateral mastoid effusion, bilateral proptosis  Micro Data:  7/11 SARS COV 2> negative 7/13 resp culture > OPF 7/23 BAL LLL >staph epi  Antimicrobials:  Pip/tazo - 7/16>7/21 Cefepime 7/24 > 7/25 Ceftriaxone 7/24 > 7/27   Interim history/subjective:  Patient arousalable, alert, and oriented. No other acute events overnight. No events of hypoglycemia. Stable on vent. Shakes head no to any current pain.   Objective   Blood pressure 127/73, pulse 61, temperature 98.2 F (36.8 C), temperature source Axillary, resp. rate 15, height 6' (1.829 m), weight (!) 150.3 kg, SpO2 98 %.    Vent Mode: PSV;CPAP FiO2 (%):  [30 %-60 %] 50 % Set Rate:  [15 bmp] 15 bmp Vt Set:  [470 mL] 470 mL PEEP:  [8 cmH20] 8 cmH20 Pressure Support:  [8 cmH20] 8 cmH20 Plateau Pressure:  [19 cmH20-28 cmH20] 19 cmH20   Intake/Output Summary (Last 24 hours) at 08/29/2019 0830 Last data filed at 08/29/2019 0800 Gross per 24 hour  Intake 1645.75 ml  Output 2405 ml  Net -759.25 ml   Filed Weights   08/27/19 0423 08/28/19 0120 08/29/19 0353  Weight: (!) 146.3 kg (!) 145.1 kg (!) 150.3 kg    Examination: General: obese male, mechanically ventilated, no acute distress  HENT: [place]/AT, PERRL, EOMI, ETT in place, anicteric sclerae; occlusive bandage over prior tracheostomy site  Lungs: distant lung sounds due to body habitus, no audible crackles, rales, or wheezing Cardiovascular: RRR, no m/r/g, distal pulses intact Abdomen: soft, non-tender, no guarding, normoactive bowel sounds Extremities: warm and dry Neuro: awake, following commands, nods  appropriately to answer questions, PERRL, moving extremities spontaneously, no apparent focal deficits noted   Assessment & Plan:  Acute on chronic hypoxic and hypercarbic respiratory failure Initially  admitted for diastolic heart failure exacerbation and has remained mostly mechanically vented throughout admission; prior tracheostomies have been unsuccessful with most recent noted to have cuff leak on 7/30 requiring reintubation.  Patient is scheduled for  total thyroidectomy and cervical lipectomy with suturing of skin flaps to to trachea for tracheostomy on 8/11 with ENT-confirmed.  - Continue vent support,  PSV as tolerated  - VAP prevention protocol - Tracheostomy per ENT  - d/c'd solumedrol for upper airway edema 08/25/19 - PRN duonebs bid  - Precedex PRN for agitation  Acute metabolic toxic encephalopathy, resolved  - Seroquel 194m bid - Versed 241mprn  Diabetes mellitus  - Decreased Detemir 35u  - Decreased Novolog to 5U +SSI - CBG monitoring  Normocytic anemia No signs of active bleeding, likely secondary to acute infection - Hgb has been stable around 9.4 - Continue to monitor   Best practice:  Diet: tube feeds Pain/Anxiety/Delirium protocol (if indicated): per protocol VAP protocol (if indicated): per protocol DVT prophylaxis: Lovenox GI prophylaxis: PPI Glucose control: Levemir + SSI Mobility: Bedrest Code Status: FULL Family Communication: daily updates to patient's sister, DiTheresa DutyDisposition: ICU  Critical care time: 30 minutes   Attending note: I have seen and examined the patient. History, labs and imaging reviewed.  [age]ear old with severe OSA/OHS, presenting with acute on chronic hypoxic, hypercarbic respiratory failure failed extubation requiring emergent tracheostomy.  Now intubated by ETT due to significant cuff leak from tracheostomy Had an episode of bradycardia yesterday which responded to atropine.  No acute events overnight  Blood pressure 118/62, pulse 64, temperature 98.3 F (36.8 C), temperature source Oral, resp. rate 15, height 6' (1.829 m), weight (!) 150.3 kg, SpO2 98 %. Gen:      No acute distress, obese HEENT:  EOMI, sclera  anicteric Neck:     No masses; no thyromegaly Lungs:    Clear to auscultation bilaterally; normal respiratory effort, ET tube CV:         Regular rate and rhythm; no murmurs Abd:      + bowel sounds; soft, non-tender; no palpable masses, no distension Ext:    No edema; adequate peripheral perfusion Skin:      Warm and dry; no rash Neuro: Sedated  Labs/Imaging personally reviewed, significant for BUN/creatinine 30/0.71, WBC 6.8, hemoglobin 9, platelets 209 No new imaging  Assessment/plan: Acute on chronic respiratory failure Continue ET tube Tentative plan for total thyroidectomy and cervical lipectomy and tracheostomy revision by ENT tomorrow Continue nebs Seroquel, Precedex for agitation  The patient is critically ill with multiple organ systems failure and requires high complexity decision making for assessment and support, frequent evaluation and titration of therapies, application of advanced monitoring technologies and extensive interpretation of multiple databases.  Critical care time - 35 mins. This represents my time independent of the NPs time taking care of the pt.  PrMarshell GarfinkelD [facility] 08/29/2019, 12:24 PM

## 2019-08-29 NOTE — Progress Notes (Signed)
[facility] ADULT ICU REPLACEMENT PROTOCOL   The patient does apply for the [facility] Adult ICU Electrolyte Replacment Protocol based on the criteria listed below:   1. Is GFR >/= 30 ml/min? Yes.    Patient's GFR today is >60 2. Is SCr </= 2? Yes.   Patient's SCr is 0.71 ml/kg/hr 3. Did SCr increase >/= 0.5 in 24 hours? No. 4. Abnormal electrolyte(s): Mag1,6 5. Ordered repletion with: protocol 6. If a panic level lab has been reported, has the CCM MD in charge been notified? Yes.  .   Physician:  Dr.Sommer  Carlisle Beers 08/29/2019 6:22 AM

## 2019-08-29 NOTE — Progress Notes (Signed)
RT note-Placed back to full support, ETT out to 21 lip, re advanced back to 25 at the lip, tolerated well, will attempt to wean again later, sedation was given, continue to monitor.

## 2019-08-30 ENCOUNTER — Inpatient Hospital Stay (HOSPITAL_COMMUNITY): Payer: Medicaid Other

## 2019-08-30 ENCOUNTER — Inpatient Hospital Stay (HOSPITAL_COMMUNITY): Payer: Medicaid Other | Admitting: Certified Registered Nurse Anesthetist

## 2019-08-30 ENCOUNTER — Encounter (HOSPITAL_COMMUNITY)
Admission: EM | Disposition: A | Discharge status: Rehabilitation Facility | Payer: Self-pay | Source: Home / Self Care | Attending: Pulmonary Disease

## 2019-08-30 ENCOUNTER — Encounter (HOSPITAL_COMMUNITY): Payer: Self-pay | Admitting: Internal Medicine

## 2019-08-30 DIAGNOSIS — J9622 Acute and chronic respiratory failure with hypercapnia: Secondary | ICD-10-CM | POA: Diagnosis not present

## 2019-08-30 DIAGNOSIS — J9621 Acute and chronic respiratory failure with hypoxia: Secondary | ICD-10-CM | POA: Diagnosis not present

## 2019-08-30 HISTORY — PX: THYROIDECTOMY: SHX17

## 2019-08-30 HISTORY — PX: TRACHEOSTOMY TUBE PLACEMENT: SHX814

## 2019-08-30 LAB — BASIC METABOLIC PANEL
Anion gap: 9 (ref 5–15)
BUN: 25 mg/dL — ABNORMAL HIGH (ref 6–20)
CO2: 28 mmol/L (ref 22–32)
Calcium: 8.6 mg/dL — ABNORMAL LOW (ref 8.9–10.3)
Chloride: 102 mmol/L (ref 98–111)
Creatinine, Ser: 0.67 mg/dL (ref 0.61–1.24)
GFR calc Af Amer: >60 mL/min (ref >60)
GFR calc non Af Amer: >60 mL/min (ref >60)
Glucose, Bld: 94 mg/dL (ref 70–99)
Potassium: 3.4 mmol/L — ABNORMAL LOW (ref 3.5–5.1)
Sodium: 139 mmol/L (ref 135–145)

## 2019-08-30 LAB — CBC
HCT: 28.8 % — ABNORMAL LOW (ref 39.0–52.0)
Hemoglobin: 8.5 g/dL — ABNORMAL LOW (ref 13.0–17.0)
MCH: 27.7 pg (ref 26.0–34.0)
MCHC: 29.5 g/dL — ABNORMAL LOW (ref 30.0–36.0)
MCV: 93.8 fL (ref 80.0–100.0)
Platelets: 199 10*3/uL (ref 150–400)
RBC: 3.07 MIL/uL — ABNORMAL LOW (ref 4.22–5.81)
RDW: 17.8 % — ABNORMAL HIGH (ref 11.5–15.5)
WBC: 5.4 10*3/uL (ref 4.0–10.5)
nRBC: 0 % (ref 0.0–0.2)

## 2019-08-30 LAB — GLUCOSE, CAPILLARY
Glucose-Capillary: 100 mg/dL — ABNORMAL HIGH (ref 70–99)
Glucose-Capillary: 106 mg/dL — ABNORMAL HIGH (ref 70–99)
Glucose-Capillary: 110 mg/dL — ABNORMAL HIGH (ref 70–99)
Glucose-Capillary: 123 mg/dL — ABNORMAL HIGH (ref 70–99)
Glucose-Capillary: 84 mg/dL (ref 70–99)
Glucose-Capillary: 96 mg/dL (ref 70–99)

## 2019-08-30 LAB — APTT: aPTT: 33 seconds (ref 24–36)

## 2019-08-30 LAB — ABO/RH: ABO/RH(D): A POS

## 2019-08-30 LAB — PROTIME-INR
INR: 1.2 (ref 0.8–1.2)
Prothrombin Time: 14.8 seconds (ref 11.4–15.2)

## 2019-08-30 LAB — PREPARE RBC (CROSSMATCH)

## 2019-08-30 LAB — MRSA PCR SCREENING: MRSA by PCR: NEGATIVE

## 2019-08-30 LAB — MAGNESIUM: Magnesium: 1.6 mg/dL — ABNORMAL LOW (ref 1.7–2.4)

## 2019-08-30 SURGERY — THYROIDECTOMY
Anesthesia: General | Site: Neck

## 2019-08-30 MED ORDER — FENTANYL CITRATE (PF) 250 MCG/5ML IJ SOLN
INTRAMUSCULAR | Status: DC | PRN
  Administered 2019-08-30: 50 ug via INTRAVENOUS
  Administered 2019-08-30: 100 ug via INTRAVENOUS
  Administered 2019-08-30 (×7): 50 ug via INTRAVENOUS

## 2019-08-30 MED ORDER — ATROPINE SULFATE 1 MG/10ML IJ SOSY
PREFILLED_SYRINGE | INTRAMUSCULAR | Status: AC
  Filled 2019-08-30: qty 10

## 2019-08-30 MED ORDER — PROPOFOL 10 MG/ML IV BOLUS
INTRAVENOUS | Status: AC
  Filled 2019-08-30: qty 20

## 2019-08-30 MED ORDER — FENTANYL CITRATE (PF) 250 MCG/5ML IJ SOLN
INTRAMUSCULAR | Status: AC
  Filled 2019-08-30: qty 5

## 2019-08-30 MED ORDER — MIDAZOLAM HCL 2 MG/2ML IJ SOLN
INTRAMUSCULAR | Status: AC
  Filled 2019-08-30: qty 2

## 2019-08-30 MED ORDER — MIDAZOLAM HCL 2 MG/2ML IJ SOLN
INTRAMUSCULAR | Status: AC
  Filled 2019-08-30: qty 4

## 2019-08-30 MED ORDER — 0.9 % SODIUM CHLORIDE (POUR BTL) OPTIME
TOPICAL | Status: DC | PRN
  Administered 2019-08-30: 1000 mL

## 2019-08-30 MED ORDER — POTASSIUM CHLORIDE 20 MEQ/15ML (10%) PO SOLN
40.0000 meq | Freq: Once | ORAL | Status: AC
  Administered 2019-08-30: 40 meq
  Filled 2019-08-30: qty 30

## 2019-08-30 MED ORDER — ALBUMIN HUMAN 5 % IV SOLN: INTRAVENOUS | Status: DC | PRN

## 2019-08-30 MED ORDER — PROPOFOL 10 MG/ML IV BOLUS
INTRAVENOUS | Status: DC | PRN
  Administered 2019-08-30: 100 mg via INTRAVENOUS

## 2019-08-30 MED ORDER — MAGNESIUM SULFATE 4 GM/100ML IV SOLN
4.0000 g | Freq: Once | INTRAVENOUS | Status: AC
  Administered 2019-08-30: 4 g via INTRAVENOUS
  Filled 2019-08-30: qty 100

## 2019-08-30 MED ORDER — MIDAZOLAM HCL 2 MG/2ML IJ SOLN
INTRAMUSCULAR | Status: DC | PRN
  Administered 2019-08-30: 2 mg via INTRAVENOUS

## 2019-08-30 MED ORDER — HEMOSTATIC AGENTS (NO CHARGE) OPTIME
TOPICAL | Status: DC | PRN
  Administered 2019-08-30: 1 via TOPICAL

## 2019-08-30 MED ORDER — LACTATED RINGERS IV SOLN: INTRAVENOUS | Status: DC | PRN

## 2019-08-30 MED ORDER — STERILE WATER FOR IRRIGATION IR SOLN
Status: DC | PRN
  Administered 2019-08-30: 1000 mL

## 2019-08-30 MED ORDER — ROCURONIUM BROMIDE 10 MG/ML (PF) SYRINGE
PREFILLED_SYRINGE | INTRAVENOUS | Status: DC | PRN
  Administered 2019-08-30 (×2): 50 mg via INTRAVENOUS
  Administered 2019-08-30: 30 mg via INTRAVENOUS
  Administered 2019-08-30: 50 mg via INTRAVENOUS
  Administered 2019-08-30: 20 mg via INTRAVENOUS

## 2019-08-30 SURGICAL SUPPLY — 58 items
ADH SKN CLS APL DERMABOND .7 (GAUZE/BANDAGES/DRESSINGS) ×2
APL SKNCLS STERI-STRIP NONHPOA (GAUZE/BANDAGES/DRESSINGS)
BENZOIN TINCTURE PRP APPL 2/3 (GAUZE/BANDAGES/DRESSINGS) IMPLANT
BLADE CLIPPER SURG (BLADE) IMPLANT
BLADE SURG 15 STRL LF DISP TIS (BLADE) ×2 IMPLANT
BLADE SURG 15 STRL SS (BLADE) ×3
CANISTER SUCT 3000ML PPV (MISCELLANEOUS) ×3 IMPLANT
CLEANER TIP ELECTROSURG 2X2 (MISCELLANEOUS) ×3 IMPLANT
CNTNR URN SCR LID CUP LEK RST (MISCELLANEOUS) ×2 IMPLANT
CONT SPEC 4OZ STRL OR WHT (MISCELLANEOUS) ×3
CORD BIPOLAR FORCEPS 12FT (ELECTRODE) ×3 IMPLANT
COVER SURGICAL LIGHT HANDLE (MISCELLANEOUS) ×3 IMPLANT
COVER WAND RF STERILE (DRAPES) IMPLANT
DECANTER SPIKE VIAL GLASS SM (MISCELLANEOUS) ×3 IMPLANT
DERMABOND ADVANCED (GAUZE/BANDAGES/DRESSINGS) ×1
DERMABOND ADVANCED .7 DNX12 (GAUZE/BANDAGES/DRESSINGS) ×2 IMPLANT
DRAIN HEMOVAC 7FR (DRAIN) IMPLANT
DRAIN PENROSE 1/4X12 LTX STRL (WOUND CARE) ×3 IMPLANT
DRAIN SNY 10 ROU (WOUND CARE) IMPLANT
DRAPE HALF SHEET 40X57 (DRAPES) ×3 IMPLANT
DRSG DRAWTEX TRACH 4X4 (GAUZE/BANDAGES/DRESSINGS) ×3 IMPLANT
ELECT COATED BLADE 2.86 ST (ELECTRODE) ×3 IMPLANT
ELECT REM PT RETURN 9FT ADLT (ELECTROSURGICAL) ×3
ELECTRODE REM PT RTRN 9FT ADLT (ELECTROSURGICAL) ×2 IMPLANT
EVACUATOR SILICONE 100CC (DRAIN) IMPLANT
FORCEPS BIPOLAR SPETZLER 8 1.0 (NEUROSURGERY SUPPLIES) ×3 IMPLANT
GAUZE 4X4 16PLY RFD (DISPOSABLE) ×6 IMPLANT
GLOVE BIO SURGEON STRL SZ 6.5 (GLOVE) IMPLANT
GLOVE ECLIPSE 7.5 STRL STRAW (GLOVE) ×3 IMPLANT
GOWN STRL REUS W/ TWL LRG LVL3 (GOWN DISPOSABLE) ×4 IMPLANT
GOWN STRL REUS W/TWL LRG LVL3 (GOWN DISPOSABLE) ×6
HEMOSTAT SURGICEL 2X14 (HEMOSTASIS) ×3 IMPLANT
KIT BASIN OR (CUSTOM PROCEDURE TRAY) ×3 IMPLANT
KIT TURNOVER KIT B (KITS) ×3 IMPLANT
NEEDLE PRECISIONGLIDE 27X1.5 (NEEDLE) ×3 IMPLANT
NS IRRIG 1000ML POUR BTL (IV SOLUTION) ×3 IMPLANT
PAD ARMBOARD 7.5X6 YLW CONV (MISCELLANEOUS) ×6 IMPLANT
PENCIL FOOT CONTROL (ELECTRODE) ×3 IMPLANT
SHEARS HARMONIC 9CM CVD (BLADE) ×3 IMPLANT
SPONGE LAP 18X18 RF (DISPOSABLE) ×9 IMPLANT
STAPLER VISISTAT 35W (STAPLE) ×3 IMPLANT
SUT CHROMIC 2 0 SH (SUTURE) ×3 IMPLANT
SUT CHROMIC 3 0 PS 2 (SUTURE) ×9 IMPLANT
SUT CHROMIC 4 0 PS 2 18 (SUTURE) IMPLANT
SUT ETHILON 3 0 PS 1 (SUTURE) ×3 IMPLANT
SUT SILK 3 0 REEL (SUTURE) ×3 IMPLANT
SUT SILK 4 0 (SUTURE) ×3
SUT SILK 4 0 REEL (SUTURE) ×3 IMPLANT
SUT SILK 4 0 SH CR/8 (SUTURE) ×3 IMPLANT
SUT SILK 4 0 TIE 10X30 (SUTURE) ×3 IMPLANT
SUT SILK 4-0 18XBRD TIE 12 (SUTURE) ×2 IMPLANT
SUT VIC AB 3-0 SH 8-18 (SUTURE) ×3 IMPLANT
SYR 20ML LL LF (SYRINGE) ×3 IMPLANT
SYR CONTROL 10ML LL (SYRINGE) ×3 IMPLANT
TOWEL GREEN STERILE FF (TOWEL DISPOSABLE) ×3 IMPLANT
TRAY ENT MC OR (CUSTOM PROCEDURE TRAY) ×3 IMPLANT
TUBE CONNECTING 12X1/4 (SUCTIONS) ×3 IMPLANT
WATER STERILE IRR 1000ML POUR (IV SOLUTION) ×3 IMPLANT

## 2019-08-30 NOTE — Progress Notes (Signed)
Patient flailing his arms and disconnected ventilator .Soft wrist restraints applied for safety and to revent dislodgement of tracheostomy

## 2019-08-30 NOTE — Progress Notes (Signed)
Op note reviewed and discussed with Dr. Constance Holster, ENT Patient had significant bleed requiring 2 units of PRBC  We will hold Lovenox for the next 3 days.  Marshell Garfinkel MD  Pulmonary and Critical Care Please see [email or web address] for pager details.  08/30/2019, 4:38 PM

## 2019-08-30 NOTE — Interval H&P Note (Signed)
History and Physical Interval Note:  08/30/2019 12:42 PM  [name]  has presented today for surgery, with the diagnosis of TRACHEAL STENOSIS.  The various methods of treatment have been discussed with the patient and family. After consideration of risks, benefits and other options for treatment, the patient has consented to  Procedure(s) with comments: THYROIDECTOMY CERVICAL LIPECTOMY (Bilateral) - NEEDS RNFA [place] (N/A) as a surgical intervention.  The patient's history has been reviewed, patient examined, no change in status, stable for surgery.  I have reviewed the patient's chart and labs.  Questions were answered to the patient's satisfaction.     Timothy Le

## 2019-08-30 NOTE — Progress Notes (Signed)
I have called Gabrian's sister [name] and updated her

## 2019-08-30 NOTE — Progress Notes (Signed)
Patient transferred to OR

## 2019-08-30 NOTE — Progress Notes (Signed)
Received patient back from the OR he is alert and restless attempt to reorient him to his room and equipment .

## 2019-08-30 NOTE — Progress Notes (Addendum)
NAME:  Timothy Le, MRN:  [id number], DOB:  [birth year], LOS: 37 ADMISSION DATE:  07/30/2019, CONSULTATION DATE:  7/11 REFERRING MD:  Kathrynn Humble, CHIEF COMPLAINT:  Dyspnea   Brief History   59 year old male with baseline chronic respiratory failure on the basis of severe OHS/OSA complicated further by significant obstructive disease in the setting of COPD. Presents with acute on chronic hypoxic and hypercarbic respiratory failure. Admitted with the initial working diagnosis of pulmonary edema and decompensated diastolic heart failure. Per family patient was without CPAP for 2 weeks PTA as his machine broke. Has required mechanical ventilation for most of the hospitalization, extubated on 7/16, had stridor, unsuccessful re-intubation, had emergent tracheostomy which was difficult.  On 7/24 he became agitated and his tracheostomy dislodged, required emergent re-intubation with following failed tracheostomy with cuff leak, now intubated awaiting tracheostomy revision.   Past Medical History   Past Medical History:  Diagnosis Date  . Asthma   . Chronic respiratory failure with hypoxia ([place]) 07/09/2017  . COPD (chronic obstructive pulmonary disease) ([place])   . Diabetes mellitus without complication ([place])   . Difficult intubation   . Hypertension   . Shortness of breath dyspnea   . Sleep apnea    Significant Hospital Events   7/15-7/16> desat to 70s that resolved with suctioning; febrile  7/16> unsuccessful extubation due to stridor requiring emergent tracheostomy after unsuccessful re-intubation; noted to have R lung atelectasis 7/23> bronch mucus plug 7/24> tracheostomy dislodged due to severe agitation; required emergent reintubation 7/26> tracheostomy revision with ENT 7/30> ETT due to cuff leak 8/09>bronch mucus plug resulting in bradycardia and code blue  Consults:  ENT   Procedures:  ET tube 7/11>7/16, 7/24> 7/26, 7/30> Trach 7/16>7/25 R IJ 7/11>   Significant Diagnostic  Tests:  Echocardiogram 7/11 >> no evidence of RV strain; LVEF 50-55%.Left ventricular diastolic function could not be evaluated. Lower extremity ultrasound 7/11 >> no evidence of lower extremity DVT MRI brain 7/20 > normal MRI brain, extensive sinus mucosal disease, bilateral mastoid effusion, bilateral proptosis  Micro Data:  7/11 SARS COV 2> negative 7/13 resp culture > OPF 7/23 BAL LLL >staph epi  Antimicrobials:  Pip/tazo - 7/16>7/21 Cefepime 7/24 > 7/25 Ceftriaxone 7/24 > 7/27   Interim history/subjective:  Patient alert and oriented this morning. Nods no to experiencing any pain. Vitals stable on vent.   Objective   Blood pressure 134/81, pulse 73, temperature 98.4 F (36.9 C), temperature source Axillary, resp. rate 16, height 6' (1.829 m), weight (!) 149.2 kg, SpO2 100 %.    Vent Mode: PRVC FiO2 (%):  [40 %-50 %] 40 % Set Rate:  [15 bmp] 15 bmp Vt Set:  [470 mL] 470 mL PEEP:  [8 cmH20] 8 cmH20 Plateau Pressure:  [10 XAJ28-78 cmH20] 20 cmH20   Intake/Output Summary (Last 24 hours) at 08/30/2019 0912 Last data filed at 08/30/2019 0800 Gross per 24 hour  Intake 1321.97 ml  Output 1560 ml  Net -238.03 ml   Filed Weights   08/28/19 0120 08/29/19 0353 08/30/19 0414  Weight: (!) 145.1 kg (!) 150.3 kg (!) 149.2 kg    Examination: General: obese male, mechanically ventilated, no acute distress  HENT: [place]/AT, PERRL, EOMI, ETT in place, anicteric sclerae; occlusive bandage over prior tracheostomy site  Lungs: distant lung sounds due to body habitus, no audible crackles, rales, or wheezing Cardiovascular: RRR, no m/r/g, distal pulses intact Abdomen: soft, non-tender, no guarding, normoactive bowel sounds Extremities: warm and dry Neuro: awake, following commands, nods appropriately to  answer questions, PERRL, moving extremities spontaneously, no apparent focal deficits noted   Assessment & Plan:  Acute on chronic hypoxic and hypercarbic respiratory failure Initially  admitted for diastolic heart failure exacerbation and has remained mostly mechanically vented throughout admission; prior tracheostomies have been unsuccessful with most recent noted to have cuff leak on 7/30 requiring reintubation.  Patient is scheduled for  total thyroidectomy and cervical lipectomy with suturing of skin flaps to to trachea for tracheostomy on 8/11 with ENT-confirmed.  - d/c'd solumedrol for upper airway edema 08/25/19 - Continue vent support,  PSV as tolerated  - VAP prevention protocol - Tracheostomy per ENT today - PRN duonebs bid    Acute metabolic toxic encephalopathy, resolved  - Seroquel 19m bid - PRN Versed 249m  Diabetes mellitus  - BG have been within normal limits  - Continue  Detemir 35u  - Continue Novolog to 5U +SSI, AM dose held today as patient NPO - CBG monitoring  Normocytic anemia No signs of active bleeding, likely secondary to acute infection - Hgb has been stable around 9.0, today decreased to 8.5 - Continue to monitor   Mg and Potassium at 1.6 and 3.4 respectively, repleted.   Best practice:  Diet: tube feeds (NPO after midnight) Pain/Anxiety/Delirium protocol (if indicated): per protocol VAP protocol (if indicated): per protocol DVT prophylaxis: Lovenox (held for surgery) GI prophylaxis: PPI Glucose control: Levemir + SSI Mobility: Bedrest Code Status: FULL Family Communication: daily updates to patient's sister, DiTheresa DutyDisposition: ICU  Critical care time: 30 minutes  [place] Attending note: I have seen and examined the patient. History, labs and imaging reviewed.  [age]ear old with severe OSA/OHS, presenting with acute on chronic hypoxic, hypercarbic respiratory failure failed extubation requiring emergent tracheostomy. Now intubated by ETTdue to significant cuff leak from tracheostomy  Blood pressure (!) 114/57, pulse 69, temperature 98.4 F (36.9 C), temperature source Axillary, resp. rate 18,  height 6' (1.829 m), weight (!) 149.2 kg, SpO2 99 %. Gen:      No acute distress, obese HEENT:  EOMI, sclera anicteric Neck:     No masses; no thyromegaly, ETT Lungs:    Clear to auscultation bilaterally; normal respiratory effort CV:         Regular rate and rhythm; no murmurs Abd:      + bowel sounds; soft, non-tender; no palpable masses, no distension Ext:    No edema; adequate peripheral perfusion Skin:      Warm and dry; no rash Neuro: Awake, responds to commands  Labs/Imaging personally reviewed, significant for Potassium 3.4, BUN/creatinine 25/0.67, WBC 5.4, hemoglobin 8.5 No new imaging  Assessment/plan: Acute on chronic respiratory failure Continue ET tube Plan for total thyroidectomy and cervical lipectomy and tracheostomy revision by ENT today Continue nebs Seroquel, Precedex for agitation  Sister updated 8/11  The patient is critically ill with multiple organ systems failure and requires high complexity decision making for assessment and support, frequent evaluation and titration of therapies, application of advanced monitoring technologies and extensive interpretation of multiple databases.  Critical care time - 35 mins. This represents my time independent of the NPs time taking care of the pt.  PrMarshell GarfinkelD [facility] 08/30/2019, 1:24 PM

## 2019-08-30 NOTE — Op Note (Signed)
OPERATIVE REPORT  DATE OF SURGERY: 08/30/2019  PATIENT:  Timothy Le,  59 y.o. male  PRE-OPERATIVE DIAGNOSIS:  TRACHEAL STENOSIS  POST-OPERATIVE DIAGNOSIS:  TRACHEAL STENOSIS  PROCEDURE:  Procedure(s): THYROIDECTOMY CERVICAL LIPECTOMY TRACHEOSTOMY  REVISION  SURGEON:  Beckie Salts, MD  ASSISTANTS: RNFA  ANESTHESIA:   General   EBL: 1000 ml  DRAINS: Quarter-inch Penrose x2  LOCAL MEDICATIONS USED:  None  SPECIMEN: 1.  Tracheostomy site 2.  Left thyroid lobe  COUNTS:  Correct  PROCEDURE DETAILS: The patient was taken to the operating room and placed on the operating table in the supine position. Following induction of general endotracheal anesthesia, with previously placed oral endotracheal tube, the neck was prepped and draped in standard fashion.  A transverse incision was outlined with a marking pen with a circular resection of the former tracheostomy site.  Electrocautery was used to incise the skin and subcutaneous tissue.  There is extensive scar tissue from the previous procedures.  Subplatysmal flaps were elevated superiorly and inferiorly.  Self-retaining retractors were used throughout the case.  1.  Left thyroid lobectomy.  The tracheostomy site was dissected down circumferentially towards the trachea and was removed and sent for pathologic evaluation.  There is extensive scar tissue and some granulation tissue making it very difficult to identify normal anatomic structures.  There is also significant bleeding with any tissues that were traumatized at all.  3-0 and 4-0 silk ties were used as needed for hemostasis.  Electrocautery and bipolar cautery were used as well.  The left sternocleidomastoid muscle was identified and reflected laterally.  The strap muscles were ultimately identified and reflected medially exposing the left lobe of the thyroid.  The left lobe was severely enlarged, approximately 8 to 10 cm in greatest dimension.  Blunt dissection was used to expose  the entire left lobe.  There was significant bleeding during the dissection.  Entire left lobe was dissected free manually and brought into the incision where it was delivered from the neck.  Berry's ligament and the vascular attachments were ligated between clamps and divided as well as using the harmonic dissector.  Bleeders were separately identified and either tied off with 304 0 silk ties or using bipolar cautery.  After the lobe was removed a suspected recurrent nerve was identified and was preserved.  Parathyroids were not separately identified.  In order to facilitate removal of the specimen the strap muscles were transected using the harmonic dissector.  The right lobe was then inspected and was felt to be in a similar fashion scarred and with significant friability and bleeding.  Initial dissection was accomplished and then upon reviewing the CT scan again a decision was made to leave the right lobe in place as it was much smaller than the left and the airway seem to be able to be secured without removing the right lobe.  2.  Cervical lipectomy.  Subcutaneous fat was dissected free and all 4 quadrants.  In each quadrant approximately 6 x 4 cm of fat was removed.  Electrocautery was used for this.  Larger caliber vessels were tied with 4-0 silk tie.  Cervical fat was not sent for pathologic evaluation.  3.  Revision tracheostomy.  Once the former tracheostomy site was removed the trachea was easily visualized and the previous tracheotomy was easily seen as well.  Thyroid and scar tissue were then dissected off of the trachea.  The left side of the tracheostomy was enlarged with electrocautery and Mayo scissors.  On the  right side the cartilage was too friable to manipulate in any way.  The left superior and inferior in the right inferior corners of the central part of the skin incision were then tacked down to the corresponding area on the trachea using 3-0 Vicryl sutures.  The right superior was too  friable to hold the suture.  The endotracheal tube was placed easily and removed as needed in order to facilitate the closure.  Penrose drains were left on either side.  18 x 14 piece of Surgicel was placed in the left thyroid bed to facilitate hemostasis.  There was no active bleeding at the time of closure.  The wound had been irrigated thoroughly prior curetted and suctioned.  Incision was reapproximated using interrupted 3-0 chromic.  A #6 adjustable length tracheostomy tube was then placed and secured in place with Velcro straps that had to be sewed together to enlarge them to accommodate the size of his neck.  4 x 4's were placed underneath the shield.  Adjustments were made on the length based on the anesthetists listening to breath sounds on the left and right side.  The patient's hemoglobin was 8.5 prior to the procedure and given the extensive blood loss decision was made to administer 2 units of packed red cells.  He maintained hemodynamic stability throughout the case.  Surgical assistant was crucial in this case for retraction, suctioning, helping to identify bleeders.  Patient was transferred to ICU in satisfactory condition.    PATIENT DISPOSITION:  To ICU, stable

## 2019-08-30 NOTE — Transfer of Care (Signed)
Immediate Anesthesia Transfer of Care Note  Patient: Timothy Le  Procedure(s) Performed: THYROIDECTOMY CERVICAL LIPECTOMY (Left Neck) TRACHEOSTOMY  REVISION (N/A Neck)  Patient Location: ICU  Anesthesia Type:General  Level of Consciousness: drowsy, patient cooperative and responds to stimulation  Airway & Oxygen Therapy: Patient remains intubated per anesthesia plan and Patient placed on Ventilator (see vital sign flow sheet for setting)  Post-op Assessment: Report given to RN and Post -op Vital signs reviewed and stable  Post vital signs: Reviewed and stable  Last Vitals:  Vitals Value Taken Time  BP    Temp 36.9 C 08/30/19 1549  Pulse    Resp    SpO2      Last Pain:  Vitals:   08/30/19 1549  TempSrc: Axillary  PainSc:          Complications: No complications documented.

## 2019-08-30 NOTE — Progress Notes (Signed)
[facility] ADULT ICU REPLACEMENT PROTOCOL   The patient does apply for the [facility] Adult ICU Electrolyte Replacment Protocol based on the criteria listed below:   1. Is GFR >/= 30 ml/min? Yes.    Patient's GFR today is >60 2. Is SCr </= 2? Yes.   Patient's SCr is 0.67 ml/kg/hr 3. Did SCr increase >/= 0.5 in 24 hours? No. 4. Abnormal electrolyte(s): K+ 3.4, Mg 1.6 5. Ordered repletion with: Protocol 6. If a panic level lab has been reported, has the CCM MD in charge been notified? Yes.  .   Physician:  Nicola Police D Nadara Mustard 08/30/2019 4:00 AM

## 2019-08-31 DIAGNOSIS — Z978 Presence of other specified devices: Secondary | ICD-10-CM | POA: Diagnosis not present

## 2019-08-31 DIAGNOSIS — J9621 Acute and chronic respiratory failure with hypoxia: Secondary | ICD-10-CM | POA: Diagnosis not present

## 2019-08-31 DIAGNOSIS — J9622 Acute and chronic respiratory failure with hypercapnia: Secondary | ICD-10-CM | POA: Diagnosis not present

## 2019-08-31 DIAGNOSIS — J810 Acute pulmonary edema: Secondary | ICD-10-CM | POA: Diagnosis not present

## 2019-08-31 LAB — CBC
HCT: 29.8 % — ABNORMAL LOW (ref 39.0–52.0)
Hemoglobin: 8.9 g/dL — ABNORMAL LOW (ref 13.0–17.0)
MCH: 27.7 pg (ref 26.0–34.0)
MCHC: 29.9 g/dL — ABNORMAL LOW (ref 30.0–36.0)
MCV: 92.8 fL (ref 80.0–100.0)
Platelets: 159 10*3/uL (ref 150–400)
RBC: 3.21 MIL/uL — ABNORMAL LOW (ref 4.22–5.81)
RDW: 18.2 % — ABNORMAL HIGH (ref 11.5–15.5)
WBC: 6.8 10*3/uL (ref 4.0–10.5)
nRBC: 0 % (ref 0.0–0.2)

## 2019-08-31 LAB — BASIC METABOLIC PANEL
Anion gap: 9 (ref 5–15)
BUN: 18 mg/dL (ref 6–20)
CO2: 26 mmol/L (ref 22–32)
Calcium: 8.5 mg/dL — ABNORMAL LOW (ref 8.9–10.3)
Chloride: 102 mmol/L (ref 98–111)
Creatinine, Ser: 0.76 mg/dL (ref 0.61–1.24)
GFR calc Af Amer: >60 mL/min (ref >60)
GFR calc non Af Amer: >60 mL/min (ref >60)
Glucose, Bld: 141 mg/dL — ABNORMAL HIGH (ref 70–99)
Potassium: 4 mmol/L (ref 3.5–5.1)
Sodium: 137 mmol/L (ref 135–145)

## 2019-08-31 LAB — TYPE AND SCREEN
ABO/RH(D): A POS
Antibody Screen: NEGATIVE
Unit division: 0
Unit division: 0

## 2019-08-31 LAB — MAGNESIUM: Magnesium: 1.5 mg/dL — ABNORMAL LOW (ref 1.7–2.4)

## 2019-08-31 LAB — POCT I-STAT, CHEM 8
BUN: 21 mg/dL — ABNORMAL HIGH (ref 6–20)
BUN: 19 mg/dL (ref 6–20)
Calcium, Ion: 1.21 mmol/L (ref 1.15–1.40)
Calcium, Ion: 1.17 mmol/L (ref 1.15–1.40)
Chloride: 98 mmol/L (ref 98–111)
Chloride: 100 mmol/L (ref 98–111)
Creatinine, Ser: 0.6 mg/dL — ABNORMAL LOW (ref 0.61–1.24)
Creatinine, Ser: 0.7 mg/dL (ref 0.61–1.24)
Glucose, Bld: 91 mg/dL (ref 70–99)
Glucose, Bld: 113 mg/dL — ABNORMAL HIGH (ref 70–99)
HCT: 30 % — ABNORMAL LOW (ref 39.0–52.0)
HCT: 29 % — ABNORMAL LOW (ref 39.0–52.0)
Hemoglobin: 10.2 g/dL — ABNORMAL LOW (ref 13.0–17.0)
Hemoglobin: 9.9 g/dL — ABNORMAL LOW (ref 13.0–17.0)
Potassium: 4 mmol/L (ref 3.5–5.1)
Potassium: 4.1 mmol/L (ref 3.5–5.1)
Sodium: 139 mmol/L (ref 135–145)
Sodium: 140 mmol/L (ref 135–145)
TCO2: 26 mmol/L (ref 22–32)
TCO2: 26 mmol/L (ref 22–32)

## 2019-08-31 LAB — BPAM RBC
Blood Product Expiration Date: 202108272359
Blood Product Expiration Date: 202108302359
ISSUE DATE / TIME: 202108111445
ISSUE DATE / TIME: 202108111445
Unit Type and Rh: 6200
Unit Type and Rh: 6200

## 2019-08-31 LAB — GLUCOSE, CAPILLARY
Glucose-Capillary: 151 mg/dL — ABNORMAL HIGH (ref 70–99)
Glucose-Capillary: 136 mg/dL — ABNORMAL HIGH (ref 70–99)
Glucose-Capillary: 132 mg/dL — ABNORMAL HIGH (ref 70–99)
Glucose-Capillary: 126 mg/dL — ABNORMAL HIGH (ref 70–99)
Glucose-Capillary: 118 mg/dL — ABNORMAL HIGH (ref 70–99)

## 2019-08-31 MED ORDER — MAGNESIUM SULFATE 4 GM/100ML IV SOLN
4.0000 g | Freq: Once | INTRAVENOUS | Status: AC
  Administered 2019-08-31: 4 g via INTRAVENOUS
  Filled 2019-08-31: qty 100

## 2019-08-31 NOTE — Progress Notes (Signed)
Pharmacy Electrolyte Replacement  Recent Labs:Mag 1.5  Recent Labs    08/31/19 0600  K 4.0  MG 1.5*  CREATININE 0.76     Plan: Give Magnesium 4g IV x1   Wilson Singer, PharmD PGY1 Pharmacy Resident 08/31/2019 9:08 AM

## 2019-08-31 NOTE — Progress Notes (Signed)
NAME:  Timothy Le, MRN:  [id number], DOB:  04-Feb-1960, LOS: 19 ADMISSION DATE:  07/30/2019, CONSULTATION DATE:  7/11 REFERRING MD:  Kathrynn Humble, CHIEF COMPLAINT:  Dyspnea   Brief History   59 year old male with baseline chronic respiratory failure on the basis of severe OHS/OSA complicated further by significant obstructive disease in the setting of COPD. Presents with acute on chronic hypoxic and hypercarbic respiratory failure. Admitted with the initial working diagnosis of pulmonary edema and decompensated diastolic heart failure. Per family patient was without CPAP for 2 weeks PTA as his machine broke. Has required mechanical ventilation for most of the hospitalization, extubated on 7/16, had stridor, unsuccessful re-intubation, had emergent tracheostomy which was difficult.  On 7/24 he became agitated and his tracheostomy dislodged, required emergent re-intubation with following failed tracheostomy with cuff leak, now s/p tracheostomy revision with total thyroidectomy and cervical lipectomy.   Past Medical History   Past Medical History:  Diagnosis Date  . Asthma   . Chronic respiratory failure with hypoxia ([place]) 07/09/2017  . COPD (chronic obstructive pulmonary disease) ([place])   . Diabetes mellitus without complication ([place])   . Difficult intubation   . Hypertension   . Shortness of breath dyspnea   . Sleep apnea    Significant Hospital Events   7/15-7/16> desat to 70s that resolved with suctioning; febrile  7/16> unsuccessful extubation due to stridor requiring emergent tracheostomy after unsuccessful re-intubation; noted to have R lung atelectasis 7/23> bronch mucus plug 7/24> tracheostomy dislodged due to severe agitation; required emergent reintubation 7/26> tracheostomy revision with ENT 7/30> ETT due to cuff leak 8/09>bronch mucus plug resulting in bradycardia and code blue 08/11>  total thyroidectomy and cervical lipectomy with suturing of skin flaps to to trachea for  tracheostomy  Consults:  ENT   Procedures:  ET tube 7/11>7/16, 7/24> 7/26, 7/30> Trach 7/16>7/25 R IJ 7/11>   Significant Diagnostic Tests:  Echocardiogram 7/11 >> no evidence of RV strain; LVEF 50-55%.Left ventricular diastolic function could not be evaluated. Lower extremity ultrasound 7/11 >> no evidence of lower extremity DVT MRI brain 7/20 > normal MRI brain, extensive sinus mucosal disease, bilateral mastoid effusion, bilateral proptosis  Micro Data:  7/11 SARS COV 2> negative 7/13 resp culture > OPF 7/23 BAL LLL >staph epi  Antimicrobials:  Pip/tazo - 7/16>7/21 Cefepime 7/24 > 7/25 Ceftriaxone 7/24 > 7/27   Interim history/subjective:  Patient alert and oriented this morning, appropriately conversational. Denies that he is in any pain.    Objective   Blood pressure 120/68, pulse 80, temperature 99.6 F (37.6 C), temperature source Oral, resp. rate 19, height 6' (1.829 m), weight (!) 149.2 kg, SpO2 100 %.    Vent Mode: CPAP;PSV FiO2 (%):  [40 %] 40 % Set Rate:  [15 bmp] 15 bmp Vt Set:  [470 mL] 470 mL PEEP:  [5 cmH20-8 cmH20] 5 cmH20 Pressure Support:  [5 cmH20] 5 cmH20   Intake/Output Summary (Last 24 hours) at 08/31/2019 0909 Last data filed at 08/31/2019 4854 Gross per 24 hour  Intake 3649.96 ml  Output 3275 ml  Net 374.96 ml   Filed Weights   08/29/19 0353 08/30/19 0414 08/31/19 0500  Weight: (!) 150.3 kg (!) 149.2 kg (!) 149.2 kg    Examination: General: obese male, trach in place and on vent, no acute distress HENT: [place]/AT, PERRL, EOMI, ETT in place, anicteric sclerae; trach in place Lungs: distant lung sounds due to body habitus, no audible crackles, rales, or wheezing Cardiovascular: RRR, no m/r/g, distal pulses  intact Abdomen: soft, non-tender, no guarding, normoactive bowel sounds Extremities: warm and dry Neuro: alert and oriented. Able to answer questions appropriately, follows commands, moves all 4 extremities spontaneously   Assessment  & Plan:  Acute on chronic hypoxic and hypercarbic respiratory failure Initially admitted for diastolic heart failure exacerbation and has remained mostly mechanically vented throughout admission; prior tracheostomies have been unsuccessful with most recent noted to have cuff leak on 7/30 requiring reintubation.  S/p total thyroidectomy and cervical lipectomy with suturing of skin flaps to to trachea for tracheostomy yesterday 08/11 requiring 2 units pRBC transfusions.  - pain management: oxycodone 70m q6 and PRN Dilaudid  - Continue vent support and wean as tolerated  - VAP prevention protocol - PRN duonebs bid    Acute metabolic toxic encephalopathy, resolved  - Seroquel 1049mbid - PRN Versed 31m40m Diabetes mellitus  - BG have been within normal limits  - Continue  Detemir 35u  - Continue Novolog to 5U +SSI, AM dose held today as patient NPO - CBG monitoring  Normocytic anemia No signs of active bleeding, likely secondary to acute infection - Hgb prior to procedure 8.5, required 2 units pRBC transfusion, Hgb today 8.9 - Holding lovenox for 3 days - Continue to monitor   Mg 1.5 this AM, repleted.   Best practice:  Diet: tube feeds Pain/Anxiety/Delirium protocol (if indicated): per protocol VAP protocol (if indicated): per protocol DVT prophylaxis: Lovenox (held for 3 days) GI prophylaxis: PPI Glucose control: Levemir + SSI Mobility: Bedrest Code Status: FULL Family Communication: daily updates to patient's sister, DiaTheresa Dutyisposition: pending  Critical care time: 30 66nutes  KenWarnell ForesterS4[place]achusettsttending note: 58 65ar old with chronic respiratory failure, OHS/OSA, COPD Mechanically ventilated 8/11-subtotal thyroidectomy, revision of tracheostomy, did require transfusion following surgery.  Hemodynamically stable Significant Hospital events regarding mucous plugging, CODE BLUE following mucous plugging noted  Today, he is awake and alert, interactive, denies  any significant discomfort Stable hemodynamics Distant breath sounds bilaterally S1-S2 appreciated Extremities warm and dry  Acute hypoxemic and hypercarbic respiratory failure -Continue vent support Diabetes mellitus -Continue insulin Airway compromise -S/p revision of tracheostomy Thyromegaly  -s/p subtotal thyroidectomy, right lobe is still in place Normocytic anemia Blood loss anemia -Continue support  Remains critically ill Continue lines of care at present Guarded prognosis Will likely require tracheostomy permanently  The patient is critically ill with multiple organ systems failure and requires high complexity decision making for assessment and support, frequent evaluation and titration of therapies, application of advanced monitoring technologies and extensive interpretation of multiple databases. Critical Care Time devoted to patient care services described in this note independent of APP/resident time (if applicable)  is 30 minutes.   AdeSherrilyn Rist LeBHollow Creeklmonary Critical Care Personal pager: [phone] unanswered, please page CCM On-call: #[phone]

## 2019-08-31 NOTE — Progress Notes (Signed)
Patient ID: Timothy Le, male   DOB: [birth year], 59 y.o.   MRN: [id number] Postoperative day 1  He is awake and alert and able to talk a little bit over the tracheostomy.  Tracheostomy is well seated.  Drains are in place.  No active bleeding.  Minimal drainage.  We will plan to remove the drains either Friday or Saturday.  Otherwise continue weaning from ventilator.

## 2019-08-31 NOTE — Progress Notes (Signed)
bil wrist restraints discontinued at 0900. No longer interfers with care. Follows commands.

## 2019-09-01 ENCOUNTER — Encounter (HOSPITAL_COMMUNITY): Payer: Self-pay | Admitting: Otolaryngology

## 2019-09-01 LAB — CBC
HCT: 29.1 % — ABNORMAL LOW (ref 39.0–52.0)
Hemoglobin: 8.7 g/dL — ABNORMAL LOW (ref 13.0–17.0)
MCH: 27.8 pg (ref 26.0–34.0)
MCHC: 29.9 g/dL — ABNORMAL LOW (ref 30.0–36.0)
MCV: 93 fL (ref 80.0–100.0)
Platelets: 147 10*3/uL — ABNORMAL LOW (ref 150–400)
RBC: 3.13 MIL/uL — ABNORMAL LOW (ref 4.22–5.81)
RDW: 17.5 % — ABNORMAL HIGH (ref 11.5–15.5)
WBC: 6.9 10*3/uL (ref 4.0–10.5)
nRBC: 0 % (ref 0.0–0.2)

## 2019-09-01 LAB — SURGICAL PATHOLOGY

## 2019-09-01 LAB — BASIC METABOLIC PANEL
Anion gap: 6 (ref 5–15)
BUN: 13 mg/dL (ref 6–20)
CO2: 28 mmol/L (ref 22–32)
Calcium: 7.5 mg/dL — ABNORMAL LOW (ref 8.9–10.3)
Chloride: 104 mmol/L (ref 98–111)
Creatinine, Ser: 0.59 mg/dL — ABNORMAL LOW (ref 0.61–1.24)
GFR calc Af Amer: >60 mL/min (ref >60)
GFR calc non Af Amer: >60 mL/min (ref >60)
Glucose, Bld: 116 mg/dL — ABNORMAL HIGH (ref 70–99)
Potassium: 3.3 mmol/L — ABNORMAL LOW (ref 3.5–5.1)
Sodium: 138 mmol/L (ref 135–145)

## 2019-09-01 LAB — GLUCOSE, CAPILLARY
Glucose-Capillary: 109 mg/dL — ABNORMAL HIGH (ref 70–99)
Glucose-Capillary: 129 mg/dL — ABNORMAL HIGH (ref 70–99)
Glucose-Capillary: 136 mg/dL — ABNORMAL HIGH (ref 70–99)
Glucose-Capillary: 138 mg/dL — ABNORMAL HIGH (ref 70–99)
Glucose-Capillary: 141 mg/dL — ABNORMAL HIGH (ref 70–99)
Glucose-Capillary: 150 mg/dL — ABNORMAL HIGH (ref 70–99)
Glucose-Capillary: 87 mg/dL (ref 70–99)

## 2019-09-01 MED ORDER — POTASSIUM CHLORIDE 20 MEQ/15ML (10%) PO SOLN
20.0000 meq | ORAL | Status: AC
  Administered 2019-09-01 (×2): 20 meq
  Filled 2019-09-01 (×2): qty 15

## 2019-09-01 MED ORDER — POTASSIUM CHLORIDE 10 MEQ/50ML IV SOLN
10.0000 meq | INTRAVENOUS | Status: AC
  Administered 2019-09-01 (×4): 10 meq via INTRAVENOUS
  Filled 2019-09-01 (×3): qty 50

## 2019-09-01 MED ORDER — OXYCODONE HCL 5 MG PO TABS
10.0000 mg | ORAL_TABLET | Freq: Four times a day (QID) | ORAL | Status: DC | PRN
  Administered 2019-09-02 – 2019-09-13 (×10): 10 mg via ORAL
  Filled 2019-09-01 (×11): qty 2

## 2019-09-01 NOTE — Progress Notes (Signed)
NAME:  Timothy Le, MRN:  [id number], DOB:  [birth year], LOS: 57 ADMISSION DATE:  07/30/2019, CONSULTATION DATE:  7/11 REFERRING MD:  Kathrynn Humble, CHIEF COMPLAINT:  Dyspnea   Brief History   59 year old male with baseline chronic respiratory failure on the basis of severe OHS/OSA complicated further by significant obstructive disease in the setting of COPD. Presents with acute on chronic hypoxic and hypercarbic respiratory failure. Admitted with the initial working diagnosis of pulmonary edema and decompensated diastolic heart failure. Per family patient was without CPAP for 2 weeks PTA as his machine broke. Has required mechanical ventilation for most of the hospitalization, extubated on 7/16, had stridor, unsuccessful re-intubation, had emergent tracheostomy which was difficult.  On 7/24 he became agitated and his tracheostomy dislodged, required emergent re-intubation with following failed tracheostomy with cuff leak, now s/p tracheostomy revision with total thyroidectomy and cervical lipectomy and stable.   Past Medical History   Past Medical History:  Diagnosis Date   Asthma    Chronic respiratory failure with hypoxia ([place]) 07/09/2017   COPD (chronic obstructive pulmonary disease) ([facility])    Diabetes mellitus without complication ([place])    Difficult intubation    Hypertension    Shortness of breath dyspnea    Sleep apnea    Significant Hospital Events   7/15-7/16> desat to 70s that resolved with suctioning; febrile  7/16> unsuccessful extubation due to stridor requiring emergent tracheostomy after unsuccessful re-intubation; noted to have R lung atelectasis 7/23> bronch mucus plug 7/24> tracheostomy dislodged due to severe agitation; required emergent reintubation 7/26> tracheostomy revision with ENT 7/30> ETT due to cuff leak 8/09>bronch mucus plug resulting in bradycardia and code blue 08/11>  total thyroidectomy and cervical lipectomy with suturing of skin flaps to to  trachea for tracheostomy  Consults:  ENT   Procedures:  ET tube 7/11>7/16, 7/24> 7/26, 7/30> Trach 7/16>7/25 R IJ 7/11>   Significant Diagnostic Tests:  Echocardiogram 7/11 >> no evidence of RV strain; LVEF 50-55%.Left ventricular diastolic function could not be evaluated. Lower extremity ultrasound 7/11 >> no evidence of lower extremity DVT MRI brain 7/20 > normal MRI brain, extensive sinus mucosal disease, bilateral mastoid effusion, bilateral proptosis  Micro Data:  7/11 SARS COV 2> negative 7/13 resp culture > OPF 7/23 BAL LLL >staph epi  Antimicrobials:  Pip/tazo - 7/16>7/21 Cefepime 7/24 > 7/25 Ceftriaxone 7/24 > 7/27   Interim history/subjective:  No acute events overnight. Patient alert and oriented this morning, appropriately conversational. Denies that he is in any pain.    Objective   Blood pressure 122/80, pulse 83, temperature 98.4 F (36.9 C), temperature source Oral, resp. rate 16, height 6' (1.829 m), weight (!) 149.2 kg, SpO2 99 %.    Vent Mode: PRVC FiO2 (%):  [40 %] 40 % Set Rate:  [15 bmp] 15 bmp Vt Set:  [470 mL] 470 mL PEEP:  [5 cmH20] 5 cmH20 Pressure Support:  [5 cmH20] 5 cmH20   Intake/Output Summary (Last 24 hours) at 09/01/2019 7939 Last data filed at 08/31/2019 1900 Gross per 24 hour  Intake 1055 ml  Output 2675 ml  Net -1620 ml   Filed Weights   08/29/19 0353 08/30/19 0414 08/31/19 0500  Weight: (!) 150.3 kg (!) 149.2 kg (!) 149.2 kg    Examination: General: obese male, trach collar in place, no acute distress HENT: [place]/AT, PERRL, EOMI, ETT in place, anicteric sclerae; trach in place without any active bleeding Lungs: distant lung sounds due to body habitus, no audible crackles, rales, or  wheezing, breathing comfortably on 40% FiO2 Cardiovascular: RRR, no m/r/g, distal pulses intact Abdomen: soft, non-tender, no guarding, normoactive bowel sounds Extremities: warm and dry Neuro: alert and oriented. Able to answer questions  appropriately, follows commands, moves all 4 extremities spontaneously   Assessment & Plan:  Acute on chronic hypoxic and hypercarbic respiratory failure, improved Initially admitted for diastolic heart failure exacerbation and has remained mostly mechanically vented throughout admission; prior tracheostomies have been unsuccessful with most recent noted to have cuff leak on 7/30 requiring reintubation. S/p total thyroidectomy and cervical lipectomy with suturing of skin flaps to to trachea for tracheostomy 08/11 requiring 2 units pRBC transfusions. Has been stable post-operatively with stable vitals and saturations, will aim to transition to step down floor today.  - pain management: oxycodone 31m q6 will transition to PRN - ENT following with drain removal today or tomorrow - trach collar on 10L with oxygen sats in upper 90-100% - VAP prevention protocol - PRN duonebs bid  - Speech Consult  - PT/OT consult   Acute metabolic toxic encephalopathy, resolved  - Seroquel 1034mbid  Diabetes mellitus  - BG have been within normal limits  - Continue  Detemir 35u  - Continue Novolog to 5U +SSI - CBG monitoring  Normocytic anemia No signs of active bleeding, likely secondary to acute infection - Hgb prior to procedure 8.5, required 2 units pRBC transfusion, Hgb today 8.7<8.9 - Holding lovenox for 3 days total (today 2/3) - Continue to monitor   K+ 3.3, repleted this AM  Best practice:  Diet: tube feeds Pain/Anxiety/Delirium protocol (if indicated): per protocol VAP protocol (if indicated): per protocol DVT prophylaxis: Lovenox (held for 3 days) today 2/3 GI prophylaxis: PPI Glucose control: Levemir + SSI Mobility: Bedrest Code Status: FULL Family Communication: daily updates to patient's sister, DiTheresa DutyDisposition: floor  Critical care time: 30 minutes  KeBristol-Myers SquibbaSecurity-WidefieldMS[place]

## 2019-09-01 NOTE — Progress Notes (Signed)
K+3.3 ?Replaced per protocol  ?

## 2019-09-02 LAB — GLUCOSE, CAPILLARY
Glucose-Capillary: 112 mg/dL — ABNORMAL HIGH (ref 70–99)
Glucose-Capillary: 113 mg/dL — ABNORMAL HIGH (ref 70–99)
Glucose-Capillary: 117 mg/dL — ABNORMAL HIGH (ref 70–99)
Glucose-Capillary: 125 mg/dL — ABNORMAL HIGH (ref 70–99)
Glucose-Capillary: 98 mg/dL (ref 70–99)
Glucose-Capillary: 98 mg/dL (ref 70–99)

## 2019-09-02 LAB — BASIC METABOLIC PANEL
Anion gap: 9 (ref 5–15)
BUN: 14 mg/dL (ref 6–20)
CO2: 29 mmol/L (ref 22–32)
Calcium: 8.3 mg/dL — ABNORMAL LOW (ref 8.9–10.3)
Chloride: 102 mmol/L (ref 98–111)
Creatinine, Ser: 0.67 mg/dL (ref 0.61–1.24)
GFR calc Af Amer: >60 mL/min (ref >60)
GFR calc non Af Amer: >60 mL/min (ref >60)
Glucose, Bld: 95 mg/dL (ref 70–99)
Potassium: 3.6 mmol/L (ref 3.5–5.1)
Sodium: 140 mmol/L (ref 135–145)

## 2019-09-02 MED ORDER — ALTEPLASE 2 MG IJ SOLR
2.0000 mg | Freq: Once | INTRAMUSCULAR | Status: AC
  Administered 2019-09-02: 2 mg

## 2019-09-02 NOTE — Progress Notes (Signed)
NAME:  Timothy Le, MRN:  [id number], DOB:  14-Aug-1960, LOS: 71 ADMISSION DATE:  07/30/2019, CONSULTATION DATE:  7/11 REFERRING MD:  Timothy Le, CHIEF COMPLAINT:  Dyspnea   Brief History   59 year old male with baseline chronic respiratory failure on the basis of severe OHS/OSA complicated further by significant obstructive disease in the setting of COPD. Presents with acute on chronic hypoxic and hypercarbic respiratory failure. Admitted with the initial working diagnosis of pulmonary edema and decompensated diastolic heart failure. Per family patient was without CPAP for 2 weeks PTA as his machine broke. Has required mechanical ventilation for most of the hospitalization, extubated on 7/16, had stridor, unsuccessful re-intubation, had emergent tracheostomy which was difficult.  On 7/24 he became agitated and his tracheostomy dislodged, required emergent re-intubation with following failed tracheostomy with cuff leak, now s/p tracheostomy revision with total thyroidectomy and cervical lipectomy.   Past Medical History   Past Medical History:  Diagnosis Date  . Asthma   . Chronic respiratory failure with hypoxia ([place]) 07/09/2017  . COPD (chronic obstructive pulmonary disease) ([place])   . Diabetes mellitus without complication ([place])   . Difficult intubation   . Hypertension   . Shortness of breath dyspnea   . Sleep apnea    Significant Hospital Events   7/15-7/16> desat to 70s that resolved with suctioning; febrile  7/16> unsuccessful extubation due to stridor requiring emergent tracheostomy after unsuccessful re-intubation; noted to have R lung atelectasis 7/23> bronch mucus plug 7/24> tracheostomy dislodged due to severe agitation; required emergent reintubation 7/26> tracheostomy revision with ENT 7/30> ETT due to cuff leak 8/09>bronch mucus plug resulting in bradycardia and code blue 08/11>  total thyroidectomy and cervical lipectomy with suturing of skin flaps to to trachea for  tracheostomy 8/14> remained on trach collar overnight  Consults:  ENT   Procedures:  ET tube 7/11>7/16, 7/24> 7/26, 7/30> Trach 7/16>7/25 R IJ 7/11>   Significant Diagnostic Tests:  Echocardiogram 7/11 >> no evidence of RV strain; LVEF 50-55%.Left ventricular diastolic function could not be evaluated. Lower extremity ultrasound 7/11 >> no evidence of lower extremity DVT MRI brain 7/20 > normal MRI brain, extensive sinus mucosal disease, bilateral mastoid effusion, bilateral proptosis  Micro Data:  7/11 SARS COV 2> negative 7/13 resp culture > OPF 7/23 BAL LLL >staph epi  Antimicrobials:  Pip/tazo - 7/16>7/21 Cefepime 7/24 > 7/25 Ceftriaxone 7/24 > 7/27   Interim history/subjective:  Patient received PMV valve this AM from speech and tolerated it well. He is without other complaints this morning.     Objective   Blood pressure 108/64, pulse 93, temperature 98.5 F (36.9 C), temperature source Oral, resp. rate (!) 21, height 6' (1.829 m), weight (!) 149.5 kg, SpO2 99 %.    FiO2 (%):  [40 %] 40 %   Intake/Output Summary (Last 24 hours) at 09/02/2019 1033 Last data filed at 09/02/2019 1000 Gross per 24 hour  Intake 1695 ml  Output 1950 ml  Net -255 ml   Filed Weights   08/30/19 0414 08/31/19 0500 09/02/19 0500  Weight: (!) 149.2 kg (!) 149.2 kg (!) 149.5 kg    Examination: General: obese male, trach in place and on trach collar, no acute distress HENT: [place]/AT, PERRL, EOMI, ETT in place, anicteric sclerae; trach in place Lungs: distant lung sounds due to body habitus, no audible crackles, rales, or wheezing Cardiovascular: RRR, no m/r/g, distal pulses intact Abdomen: soft, non-tender, no guarding, normoactive bowel sounds Extremities: warm and dry Neuro: alert and oriented. Able  to answer questions appropriately, follows commands, moves all 4 extremities spontaneously   Assessment & Plan:  Acute on chronic hypoxic and hypercarbic respiratory failure Initially  admitted for diastolic heart failure exacerbation and has remained mostly mechanically vented throughout admission; prior tracheostomies have been unsuccessful with most recent noted to have cuff leak on 7/30 requiring reintubation.  S/p total thyroidectomy and cervical lipectomy with suturing of skin flaps to to trachea for tracheostomy  08/11  - Requiring 40% FiO2 via trach collar  Tracheostomy - PMV valve added today and tolerated it well - Speech to continue to follow for advancing oral diet in near future - PCCM to follow on the floor for trach care  Diabetes mellitus  - BG have been within normal limits  - Continue  Detemir 35u  - Continue Novolog to 5U +SSI - CBG monitoring  Normocytic anemia No signs of active bleeding, likely secondary to acute infection - Hgb prior to procedure 8.5, required 2 units pRBC transfusion, Hgb today 8.9 - Continue to monitor  - Lovenox held 3 days post-op, can resume on 9/03  Acute metabolic toxic encephalopathy, resolved  - Seroquel 168m bid Mg 1.5 this AM, repleted.   Disposition: Will transfer patient to the progressive care unit with hospitalist care to start on 8/15 and PCCM to follow for trach care  Best practice:  Diet: tube feeds Pain/Anxiety/Delirium protocol (if indicated): per protocol VAP protocol (if indicated): per protocol DVT prophylaxis: Lovenox (hold until 8/15) GI prophylaxis: PPI Glucose control: Levemir + SSI Mobility: Bedrest Code Status: FULL Family Communication: daily updates to patient's sister, DTheresa Le Disposition: pending  Timothy Jackson MD [facility]oul[facility]ulmonary [facility] Critical Care Office: [phone]  See Amion for Pager Details

## 2019-09-02 NOTE — Progress Notes (Signed)
Patient ID: Timothy Le, male   DOB: 1960/02/16, 59 y.o.   MRN: [id number] He is awake and alert, able to phonate.  Tracheostomy in good position.  Both drains removed.  Incisions are healing well.  Continue trach care and contact us if needed.

## 2019-09-02 NOTE — Progress Notes (Signed)
Timothy Le ties changed prior to transfer to [facility], Therapist, sports at bedside assiting.  No complications noted.  RT will continue to monitor.

## 2019-09-02 NOTE — Evaluation (Signed)
Clinical/Bedside Swallow Evaluation Patient Details  Name: Timothy Le MRN: [id number] Date of Birth: October 03, 1960  Today's Date: 09/02/2019 Time: SLP Start Time (ACUTE ONLY): 0856 SLP Stop Time (ACUTE ONLY): 0911 SLP Time Calculation (min) (ACUTE ONLY): 15 min  Past Medical History:  Past Medical History:  Diagnosis Date  . Asthma   . Chronic respiratory failure with hypoxia ([place]) 07/09/2017  . COPD (chronic obstructive pulmonary disease) ([place])   . Diabetes mellitus without complication ([place])   . Difficult intubation   . Hypertension   . Shortness of breath dyspnea   . Sleep apnea    Past Surgical History:  Past Surgical History:  Procedure Laterality Date  . IR GASTROSTOMY TUBE MOD SED  07/05/2017  . THYROIDECTOMY Left 08/30/2019   Procedure: THYROIDECTOMY CERVICAL LIPECTOMY;  Surgeon: Izora Gala, MD;  Location: [facility];  Service: ENT;  Laterality: Left;  NEEDS RNFA PLEASE  . TRACHEOSTOMY TUBE PLACEMENT N/A 06/17/2017   Procedure: TRACHEOSTOMY;  Surgeon: Leta Baptist, MD;  Location: [facility];  Service: ENT;  Laterality: N/A;  . TRACHEOSTOMY TUBE PLACEMENT N/A 08/14/2019   Procedure: TRACHEOSTOMY;  Surgeon: Izora Gala, MD;  Location: [facility];  Service: ENT;  Laterality: N/A;  . TRACHEOSTOMY TUBE PLACEMENT N/A 08/30/2019   Procedure: TRACHEOSTOMY  REVISION;  Surgeon: Izora Gala, MD;  Location: [facility];  Service: ENT;  Laterality: N/A;   HPI:  59 year old male with baseline chronic respiratory failure on the basis of severe OHS/OSA complicated further by significant obstructive disease in the setting of COPD.  Presents with acute on chronic hypoxic and hypercarbic respiratory failure. Has required mechanical ventilation for most of the hospitalization, extubated on 7/16, had stridor, unsuccessful re-intubation, had emergent tracheostomy which was difficult.  On 7/24 he became agitated and his tracheostomy dislodged, required emergent re-intubation with following failed tracheostomy with cuff  leak, now s/p tracheostomy revision with total thyroidectomy and cervical lipectomy.  Pt has a hx of a trach and oropharyngeal dysphagia in 2019 with two FEES completed during that admission.     Assessment / Plan / Recommendation Clinical Impression  Patient was seen for a bedside swallow evaluation.  He was encountered awake/alert with trach in place, and PMV was donned for this evaluation.  Oral mechanism examination was remarkable for suspected thrush and edentulism (pt has dentures at home).  Pt consumed ice chips x5 with good bolus acceptance, suspected timely AP transport, and suspected timely swallow initiation.  No overt s/sx of aspiration were noted initially; however, a delayed cough was observed >1 minute following PO trials.  Cough was strong and resulted in mobilization of secretions to the pt's oral cavity.  Secretions were then suctioned.  Due to prolonged intubations, tracheostomy with complications, and hx of oropharyngeal dysphagia, recommend an instrumental swallow study to further evaluation swallow function prior to initiating a diet.  Recommend that pt remain NPO with frequent oral care at this time with continuation of temporary alternative means of nutrition via Cortrak.  SLP will f/u to determine readiness for instrumental swallow study.    SLP Visit Diagnosis: Dysphagia, unspecified (R13.10)    Aspiration Risk  Severe aspiration risk;Moderate aspiration risk    Diet Recommendation NPO;Alternative means - temporary        Other  Recommendations Oral Care Recommendations: Oral care QID;Staff/trained caregiver to provide oral care Other Recommendations: Have oral suction available   Follow up Recommendations LTACH;Inpatient Rehab      Frequency and Duration min 2x/week  2 weeks  Prognosis Prognosis for Safe Diet Advancement: Fair Barriers to Reach Goals: Severity of deficits      Swallow Study   General HPI: 59 year old male with baseline chronic  respiratory failure on the basis of severe OHS/OSA complicated further by significant obstructive disease in the setting of COPD.  Presents with acute on chronic hypoxic and hypercarbic respiratory failure. Has required mechanical ventilation for most of the hospitalization, extubated on 7/16, had stridor, unsuccessful re-intubation, had emergent tracheostomy which was difficult.  On 7/24 he became agitated and his tracheostomy dislodged, required emergent re-intubation with following failed tracheostomy with cuff leak, now s/p tracheostomy revision with total thyroidectomy and cervical lipectomy.  Pt has a hx of a trach and dysphagia in 2019 with two FEES completed during that admission.   Type of Study: Bedside Swallow Evaluation Previous Swallow Assessment: Two FEES completed in 2019 Diet Prior to this Study: NPO Temperature Spikes Noted: Yes Respiratory Status: Trach Collar History of Recent Intubation: Yes Length of Intubations (days): 18 days (across 3 intubations ) Date extubated: 08/30/19 Behavior/Cognition: Alert;Cooperative;Pleasant mood Oral Cavity Assessment: Other (comment) (Suspected thrush otherwise WFL) Oral Care Completed by SLP: Recent completion by staff Oral Cavity - Dentition: Edentulous (Dentures at home ) Vision: Functional for self-feeding Patient Positioning: Partially reclined Baseline Vocal Quality: Hoarse Volitional Cough: Strong Volitional Swallow: Able to elicit    Oral/Motor/Sensory Function Overall Oral Motor/Sensory Function: Within functional limits   Ice Chips Ice chips: Impaired Presentation: Spoon Pharyngeal Phase Impairments: Cough - Delayed   Thin Liquid Thin Liquid: Not tested    Nectar Thick Nectar Thick Liquid: Not tested   Honey Thick Honey Thick Liquid: Not tested   Puree Puree: Not tested   Solid     Solid: Not tested     Colin Mulders M.S., CCC-SLP Acute Rehabilitation Services Office: [phone]   Elvia Collum Elise Knobloch 09/02/2019,10:21  AM

## 2019-09-02 NOTE — Evaluation (Signed)
[facility] Speaking Valve - Evaluation Patient Details  Name: Timothy Le MRN: [id number] Date of Birth: [birth year]  Today's Date: 09/02/2019 Time: 3893-7342 SLP Time Calculation (min) (ACUTE ONLY): 32 min  Past Medical History:  Past Medical History:  Diagnosis Date  . Asthma   . Chronic respiratory failure with hypoxia ([place]) 07/09/2017  . COPD (chronic obstructive pulmonary disease) ([place])   . Diabetes mellitus without complication ([place])   . Difficult intubation   . Hypertension   . Shortness of breath dyspnea   . Sleep apnea    Past Surgical History:  Past Surgical History:  Procedure Laterality Date  . IR GASTROSTOMY TUBE MOD SED  07/05/2017  . THYROIDECTOMY Left 08/30/2019   Procedure: THYROIDECTOMY CERVICAL LIPECTOMY;  Surgeon: Izora Gala, MD;  Location: [facility];  Service: ENT;  Laterality: Left;  NEEDS RNFA PLEASE  . TRACHEOSTOMY TUBE PLACEMENT N/A 06/17/2017   Procedure: TRACHEOSTOMY;  Surgeon: Leta Baptist, MD;  Location: [facility];  Service: ENT;  Laterality: N/A;  . TRACHEOSTOMY TUBE PLACEMENT N/A 08/14/2019   Procedure: TRACHEOSTOMY;  Surgeon: Izora Gala, MD;  Location: [facility];  Service: ENT;  Laterality: N/A;  . TRACHEOSTOMY TUBE PLACEMENT N/A 08/30/2019   Procedure: TRACHEOSTOMY  REVISION;  Surgeon: Izora Gala, MD;  Location: [facility];  Service: ENT;  Laterality: N/A;   HPI:  59 year old male with baseline chronic respiratory failure on the basis of severe OHS/OSA complicated further by significant obstructive disease in the setting of COPD.  Presents with acute on chronic hypoxic and hypercarbic respiratory failure. Has required mechanical ventilation for most of the hospitalization, extubated on 7/16, had stridor, unsuccessful re-intubation, had emergent tracheostomy which was difficult.  On 7/24 he became agitated and his tracheostomy dislodged, required emergent re-intubation with following failed tracheostomy with cuff leak, now s/p tracheostomy revision with total  thyroidectomy and cervical lipectomy.    Assessment / Plan / Recommendation Clinical Impression  Patient was seen for a speaking valve evaluation in the setting of aphonia secondary to tracheostomy.  Patient was encountered awake/alert in bed and he was observed to speak fairly well around his trach, despite the cuff being inflated 2cc.  Purpose of speaking valve was explained to the pt and he was agreeable to the evaluation.  Cuff was deflated and pt tolerated well, with one noted cough.  Finger occlusion trial was performed and pt was able to successfully phonate with an appropriate vocal volume.  PMV was placed and pt tolerated it without difficulty.  Phonation was again achieved, and voice was noted to be hoarse.   Some stridor was noted during respirations with and without the PMV in place.  PMV was removed and minimal back pressure was observed.  PMV was donned again and pt tolerated for 20 minutes.  During this time, a bedside swallow evaluation was completed and pt exhibited delayed coughing with ice chips, resulting in the mobilization of white, thick secretions to his oral cavity that were subsequently suctioned.  No additional coughing was noted during this evaluation.  Vitals remained stable throughout the 20 minutes with HR ranging from 85-89 bmp, RR ranging from 16-23, and SpO2 ranging from 97%-100%.  Following 20 minutes, PMV was doffed, cleaned, and left in the pt's room in it's case.  Sticker was placed on the pt's trach as a reminder to keep the cuff deflated during PMV use, and a sign was hung above the pt's bed.  Cuff was left deflated at the end of this evaluation with RN approval.  Recommend that pt use the PMV during all therapies and with trained staff given full supervision.  RN was made aware, and sign detailing recommendations was hung above the pt's bed.  SLP will f/u for PMV tolerance per POC.    SLP Visit Diagnosis: Aphonia (R49.1)    SLP Assessment  Patient needs continued  Speech Lanaguage Pathology Services    Follow Up Recommendations  LTACH;Inpatient Rehab    Frequency and Duration min 2x/week  2 weeks    PMSV Trial PMSV was placed for: 20 minutes Able to redirect subglottic air through upper airway: Yes Able to Attain Phonation: Yes Voice Quality: Hoarse Able to Expectorate Secretions: Yes Level of Secretion Expectoration with PMSV: Oral Breath Support for Phonation: Mildly decreased Intelligibility: Intelligibility reduced Word: 75-100% accurate Phrase: 75-100% accurate Sentence: 75-100% accurate Conversation: 75-100% accurate Behavior: Alert;Controlled;Good eye contact;Responsive to questions;Cooperative   Tracheostomy Tube       Vent Dependency  FiO2 (%): 40 %    Cuff Deflation Trial  GO Tolerated Cuff Deflation: Yes Length of Time for Cuff Deflation Trial: 20+ minutes Behavior: Alert;Good eye contact;[place]., M.S., [place] Office: [phone]   [place] 09/02/2019, 9:50 AM

## 2019-09-03 DIAGNOSIS — J988 Other specified respiratory disorders: Secondary | ICD-10-CM

## 2019-09-03 LAB — GLUCOSE, CAPILLARY
Glucose-Capillary: 141 mg/dL — ABNORMAL HIGH (ref 70–99)
Glucose-Capillary: 94 mg/dL (ref 70–99)
Glucose-Capillary: 133 mg/dL — ABNORMAL HIGH (ref 70–99)
Glucose-Capillary: 128 mg/dL — ABNORMAL HIGH (ref 70–99)
Glucose-Capillary: 87 mg/dL (ref 70–99)

## 2019-09-03 MED ORDER — ENOXAPARIN SODIUM 80 MG/0.8ML ~~LOC~~ SOLN
0.5000 mg/kg | SUBCUTANEOUS | Status: DC
  Administered 2019-09-04 – 2019-09-15 (×12): 75 mg via SUBCUTANEOUS
  Filled 2019-09-03 (×10): qty 0.8
  Filled 2019-09-03: qty 0.75
  Filled 2019-09-03: qty 0.8

## 2019-09-03 NOTE — Progress Notes (Signed)
PROGRESS NOTE  Timothy Le  DOB: [birth year]  PCP: Kerin Perna, NP OIN:[id number]  DOA: 07/30/2019  LOS: 35 days   Chief complaint: Shortness of breath  Brief narrative: Patient is a 59 year old African-American male with PMH significant for DM 2, HTN, COPD on home oxygen, morbid obesity, obstructive sleep apnea. Patient presented to the ED on 07/30/2019 with worsening shortness of breath. Per family, patient was used to use CPAP nightly but it broke 2 weeks prior to presentation and hence he had not been using it.    Initial work-up suggested pulmonary edema and decompensated diastolic heart failure. He was intubated in the ED and admitted to ICU.  He required mechanical ventilation for most of the hospitalization.   7/16> he was extubated, had stridor, unsuccessful re-intubation, had emergent tracheostomy which was difficult. 7/23> underwent bronchoscopy because of mucous plug. 7/24> tracheostomy dislodged due to severe agitation; required emergent reintubation 7/26> tracheostomy revision with ENT 7/30> ETT due to cuff leak 8/09> bronch mucus plug resulting in bradycardia and code blue 8/11> total thyroidectomy and cervical lipectomy with suturing of skin flaps to to trachea for tracheostomy 8/14> neck drains removed by ENT.  Transferred out of ICU to hospitalist service on trach collar and Dobbhoff tube  Subjective: Patient was seen and examined this morning. Middle-aged morbidly obese African-American male.  Propped up in bed.  Alert, awake, slow to respond but oriented. Has oxygen through trach collar, has a core track tube with ongoing tube feeding He seems to have clear voice to me and he is asking when he can eat.  Assessment/Plan: Acute on chronic hypoxic and hypercarbic respiratory failure, improved -Initially admitted for CHF exacerbation.  Patient has remained mostly mechanically vented throughout admission. -See above for the timeline of reintubation,  tracheostomy and thyroidectomy.  -Currently on 8 L/min of oxygen through tracheostomy collar -Continue to wean down as tolerated. -Continue VAP prevention protocol -Continue as needed duo nebs.  Severe dysphagia -Speech therapy evaluation obtained.  Per note from 8/14, patient has severe aspiration risk and has been recommended n.p.o. status. -Patient is currently on tube feeding through core track. -We will discuss about the consideration of PEG tube placement.  Acute exacerbation of chronic diastolic CHF Essential hypertension -Primary diagnosis on admission.  Appropriately treated with diuresis. -Home meds include Torsemide 5 mg daily, metolazone 2.5 mg daily, potassium 20 mg daily -Echocardiogram 7/11 >> no evidence of RV strain; LVEF 50-55%.Left ventricular diastolic function could not be evaluated. -Currently patient is not on any diuretics or other heart failure medications.  Once breathing status improves, we can initiate on low-dose beta-blockers.  Currently remains euvolemic. -Blood pressure mostly normal range.  Continue Cardura 4 mg daily.  IV hydralazine as needed.  Uncontrolled diabetes mellitus type 2 -A1c 14.9 on 7/15. -Home list shows Levemir 35 units at bedtime, NovoLog Premeal, Metformin 1000 mg twice daily.   -Currently patient has tube feeding on and he is getting Levemir 35 units twice daily, q4 hrs NovoLog and sliding scale insulin.  -Glucose trends seems satisfactory. Recent Labs  Lab 09/02/19 1648 09/02/19 2017 09/02/19 2339 09/03/19 0442 09/03/19 0759  GLUCAP 117* 112* 125* 141* 94   Acute anemia of blood loss -Prior to this hospitalization, patient seems to have normal hemoglobin level. -Seems to be running hemoglobin mostly between 9-10 in the hospital. -8/11, patient received 2 units of PRBCs intraoperatively. -Currently on Protonix 40 mg daily. -Obtain iron panel. Recent Labs    08/24/19 0514 08/24/19 0516 08/25/19 0359  08/28/19 0416  08/29/19 0441 08/30/19 0258 08/30/19 1354 08/30/19 1533 08/31/19 0600 09/01/19 0527  HGB 9.5* 9.5* 9.1* 9.2* 9.0* 8.5* 10.2* 9.9* 8.9* 8.7*   Acute metabolic encephalopathy -Patient had episodes of agitation likely related to hospitalization and respiratory failure -MRI brain 7/20 > normal MRI brain, extensive sinus mucosal disease, bilateral mastoid effusion, bilateral proptosis  -Continue with Zoloft and as needed oxycodone to prevent agitation and risk of tube dislodgment.  Hyperlipidemia -Continue Lipitor 10 mg daily.  History of gout -at home, patient was on allopurinol 100 mg daily  Mobility: Pending PT eval Code Status:   Code Status: Full Code  Nutritional status: Body mass index is 43.29 kg/m. Nutrition Problem: Inadequate oral intake Etiology: inability to eat Signs/Symptoms: NPO status Diet Order    None      DVT prophylaxis: SCDs Start: 07/31/19 1712   Antimicrobials:  Not on antibiotics Fluid: Not on normal saline.  Getting core track feeding  Consultants: Critical care for trach management Family Communication:  None at bedside  Status is: Inpatient  Remains inpatient appropriate because:Ongoing diagnostic testing needed not appropriate for outpatient work up and IV treatments appropriate due to intensity of illness or inability to take PO   Dispo: The patient is from: Home              Anticipated d/c is to: LTAC versus SNF.  Pending PT eval              Anticipated d/c date is: > 3 days              Patient currently is not medically stable to d/c.       Infusions:  . sodium chloride    . sodium chloride Stopped (08/30/19 1427)  . feeding supplement (VITAL HIGH PROTEIN) 1,000 mL (09/03/19 0550)    Scheduled Meds: . atorvastatin  10 mg Per Tube Daily  . chlorhexidine gluconate (MEDLINE KIT)  15 mL Mouth Rinse BID  . Chlorhexidine Gluconate Cloth  6 each Topical Daily  . docusate  100 mg Per Tube BID  . doxazosin  4 mg Per Tube Daily  .  feeding supplement (PROSource TF)  45 mL Per Tube TID  . insulin aspart  0-20 Units Subcutaneous Q4H  . insulin aspart  5 Units Subcutaneous Q4H  . insulin detemir  35 Units Subcutaneous Q12H  . mouth rinse  15 mL Mouth Rinse 10 times per day  . melatonin  3 mg Per Tube QHS  . multivitamin  15 mL Per Tube Daily  . pantoprazole sodium  40 mg Per Tube QHS  . polyethylene glycol  17 g Per Tube Daily  . QUEtiapine  100 mg Per Tube BID  . sertraline  100 mg Per Tube Daily  . sodium chloride flush  10-40 mL Intracatheter Q12H    Antimicrobials: Anti-infectives (From admission, onward)   Start     Dose/Rate Route Frequency Ordered Stop   08/13/19 1000  cefTRIAXone (ROCEPHIN) 2 g in sodium chloride 0.9 % 100 mL IVPB  Status:  Discontinued        2 g 200 mL/hr over 30 Minutes Intravenous Every 24 hours 08/13/19 0912 08/15/19 1150   08/12/19 1000  ceFEPIme (MAXIPIME) 2 g in sodium chloride 0.9 % 100 mL IVPB  Status:  Discontinued        2 g 200 mL/hr over 30 Minutes Intravenous Every 8 hours 08/12/19 0939 08/13/19 0912   08/09/19 1700  piperacillin-tazobactam (ZOSYN) IVPB 3.375  g  Status:  Discontinued        3.375 g 12.5 mL/hr over 240 Minutes Intravenous Every 8 hours 08/08/19 1634 08/08/19 1707   08/08/19 1707  piperacillin-tazobactam (ZOSYN) IVPB 3.375 g  Status:  Discontinued        3.375 g 12.5 mL/hr over 240 Minutes Intravenous Every 8 hours 08/08/19 1707 08/09/19 1128   08/04/19 2200  piperacillin-tazobactam (ZOSYN) IVPB 3.375 g  Status:  Discontinued        3.375 g 12.5 mL/hr over 240 Minutes Intravenous Every 8 hours 08/04/19 1413 08/08/19 1634   08/04/19 1300  vancomycin (VANCOREADY) IVPB 1750 mg/350 mL        1,750 mg 175 mL/hr over 120 Minutes Intravenous  Once 08/04/19 1249 08/04/19 1631   08/01/19 1230  cefTRIAXone (ROCEPHIN) 2 g in sodium chloride 0.9 % 100 mL IVPB  Status:  Discontinued        2 g 200 mL/hr over 30 Minutes Intravenous Every 24 hours 08/01/19 1226 08/04/19  1413   08/01/19 1230  azithromycin (ZITHROMAX) 500 mg in sodium chloride 0.9 % 250 mL IVPB  Status:  Discontinued        500 mg 250 mL/hr over 60 Minutes Intravenous Every 24 hours 08/01/19 1226 08/04/19 1413      PRN meds: Place/Maintain arterial line **AND** sodium chloride, hydrALAZINE, ipratropium-albuterol, oxyCODONE, sodium chloride flush   Objective: Vitals:   09/03/19 0759 09/03/19 0900  BP:  131/71  Pulse:    Resp: (!) 23   Temp:    SpO2: 100%     Intake/Output Summary (Last 24 hours) at 09/03/2019 1000 Last data filed at 09/03/2019 0942 Gross per 24 hour  Intake 1985 ml  Output 940 ml  Net 1045 ml   Filed Weights   08/31/19 0500 09/02/19 0500 09/03/19 0454  Weight: (!) 149.2 kg (!) 149.5 kg (!) 144.8 kg   Weight change: -4.7 kg Body mass index is 43.29 kg/m.   Physical Exam: General exam: Morbidly obese, has trach collar and core track.  Appears calm and comfortable.  Not in physical distress Skin: No rashes, lesions or ulcers. HEENT: Atraumatic, normocephalic, supple neck, no obvious bleeding Lungs: Diminished air entry in both bases.  Occasional rales. CVS: Heart rate in 90s, no murmur. GI/Abd soft, distended from obesity, nontender, bowel sound present. CNS: Alert, awake, slow to respond but oriented to time Psychiatry: Mood appropriate Extremities: No pedal edema, no calf tenderness.  Data Review: I have personally reviewed the laboratory data and studies available.  Recent Labs  Lab 08/28/19 0416 08/28/19 0416 08/29/19 0441 08/29/19 0441 08/30/19 0258 08/30/19 1354 08/30/19 1533 08/31/19 0600 09/01/19 0527  WBC 7.3  --  6.8  --  5.4  --   --  6.8 6.9  HGB 9.2*   < > 9.0*   < > 8.5* 10.2* 9.9* 8.9* 8.7*  HCT 30.8*   < > 30.3*   < > 28.8* 30.0* 29.0* 29.8* 29.1*  MCV 94.5  --  95.3  --  93.8  --   --  92.8 93.0  PLT 251  --  209  --  199  --   --  159 147*   < > = values in this interval not displayed.   Recent Labs  Lab 08/28/19 0416  08/28/19 0416 08/29/19 0441 08/29/19 0441 08/30/19 0258 08/30/19 0258 08/30/19 1354 08/30/19 1533 08/31/19 0600 09/01/19 0527 09/02/19 0450  NA 138   < > 139   < > 139   < >  139 140 137 138 140  K 3.4*   < > 3.8   < > 3.4*   < > 4.0 4.1 4.0 3.3* 3.6  CL 101   < > 102   < > 102   < > 98 100 102 104 102  CO2 30   < > 28  --  28  --   --   --  _0 GLUCOSE 220*   < > 118*   < > 94   < > 91 113* 141* 116* 95  BUN 36*   < > 30*   < > 25*   < > 21* _1 CREATININE 0.75   < > 0.71   < > 0.67   < > 0.60* 0.70 0.76 0.59* 0.67  CALCIUM 8.5*   < > 8.5*  --  8.6*  --   --   --  8.5* 7.5* 8.3*  MG 1.8  --  1.6*  --  1.6*  --   --   --  1.5*  --   --    < > = values in this interval not displayed.   Lab Results  Component Value Date   HGBA1C 14.9 (H) 08/03/2019       Component Value Date/Time   TRIG 91 08/23/2019 0456   Signed, Terrilee Croak, MD [facility] Pager: [phone] (Secure Chat preferred). 09/03/2019

## 2019-09-03 NOTE — Anesthesia Postprocedure Evaluation (Signed)
Anesthesia Post Note  Patient: Timothy Le  Procedure(s) Performed: THYROIDECTOMY CERVICAL LIPECTOMY (Left Neck) TRACHEOSTOMY  REVISION (N/A Neck)     Patient location during evaluation: ICU Anesthesia Type: General Level of consciousness: sedated and patient remains intubated per anesthesia plan Pain management: pain level controlled Vital Signs Assessment: post-procedure vital signs reviewed and stable Respiratory status: patient on ventilator - see flowsheet for VS Cardiovascular status: stable and blood pressure returned to baseline Postop Assessment: no apparent nausea or vomiting Anesthetic complications: no   No complications documented.  Last Vitals:  Vitals:   09/03/19 1604 09/03/19 1635  BP:  128/72  Pulse: 97 91  Resp: (!) 22 17  Temp:  37 C  SpO2: 95% 96%    Last Pain:  Vitals:   09/03/19 1635  TempSrc: Oral  PainSc: Asleep                 Sterlin Knightly,Avram COKER

## 2019-09-03 NOTE — Progress Notes (Signed)
Nurse called me to patient room due to respiratory distress, upon walking in the room patient was sitting on the side of the bed stating that there was something in the back of his throat, when I listened to his breath sounds there was expiratory wheezing throughout with coarse diminished breath sounds, gave patient a Duo neb breathing treatment and suctioned his trach, got some thick secretions out, patient felt a lot better after the treatment. SATS 98% placed back on 35% ATC, will continue to monitor patient.

## 2019-09-03 NOTE — Anesthesia Preprocedure Evaluation (Signed)
Anesthesia Evaluation  Patient identified by MRN, date of birth, ID band Patient unresponsive    Reviewed: Patient's Chart, lab work & pertinent test results, Unable to perform ROS - Chart review only  Airway Mallampati: Trach       Dental   Pulmonary former smoker,    + rhonchi  + decreased breath sounds      Cardiovascular hypertension,  Rhythm:Regular Rate:Normal     Neuro/Psych    GI/Hepatic   Endo/Other  diabetes  Renal/GU      Musculoskeletal   Abdominal (+) + obese,   Peds  Hematology   Anesthesia Other Findings   Reproductive/Obstetrics                             Anesthesia Physical Anesthesia Plan  ASA: III  Anesthesia Plan: General   Post-op Pain Management:    Induction: Intravenous  PONV Risk Score and Plan: Ondansetron  Airway Management Planned: Tracheostomy  Additional Equipment:   Intra-op Plan:   Post-operative Plan:   Informed Consent: I have reviewed the patients History and Physical, chart, labs and discussed the procedure including the risks, benefits and alternatives for the proposed anesthesia with the patient or authorized representative who has indicated his/her understanding and acceptance.       Plan Discussed with: CRNA and Anesthesiologist  Anesthesia Plan Comments:         Anesthesia Quick Evaluation

## 2019-09-03 NOTE — Evaluation (Signed)
Occupational Therapy Evaluation Patient Details Name: Timothy Le MRN: [id number] DOB: March 06, 1960 Today's Date: 09/03/2019    History of Present Illness 59 year old African-American male with PMH significant for DM 2, HTN, COPD on home oxygen, morbid obesity, obstructive sleep apnea. Patient presented to the ED on 07/30/2019 with worsening shortness of breath. Initial work-up suggested pulmonary edema and decompensated diastolic heart failure. He was intubated in the ED and admitted to ICU.  He required mechanical ventilation for most of the hospitalization. On 8/11 he underwent total thyroidectomy and cervical lipectomy with suturing of skin flaps to to trachea for tracheostomy.    Clinical Impression   Pt admitted with the above diagnoses and presents with below problem list. Pt will benefit from continued acute OT to address the below listed deficits and maximize independence with basic ADLs prior to d/c to venue below. At baseline, pt was independent to mod I with basic ADLs. Pt currently mod A with UB ADLs in seated position, max +2 A for LB ADLs in sitting/lateral lean. Pt sat EOB about 20 minutes at min guard to mod A level. Attempted standing, squat-pivot and stedy with pt giving good effort but ultimately unable to clear hips from EOB. Pt reporting lightheadedness on standing attempt, improved with return to supine.      Follow Up Recommendations  LTACH    Equipment Recommendations  Other (comment) (defer to next venue)    Recommendations for Other Services       Precautions / Restrictions Precautions Precautions: Fall Precaution Comments: trach collar and Dobbhoff tube, PMV Restrictions Weight Bearing Restrictions: No      Mobility Bed Mobility Overal bed mobility: Needs Assistance Bed Mobility: Supine to Sit;Sit to Supine     Supine to sit: Mod assist;+2 for physical assistance;HOB elevated Sit to supine: Mod assist;+2 for physical assistance   General bed  mobility comments: assist fully powerup trunk and pivot hips to full EOB position. Pt relying on momentum and external leverage of rails vs therapists' arms. Pt needing assist to control decent into supine and fully advance BLE onto bed at end of session  Transfers                 General transfer comment: attempted with +2 assist to stand, squat-pivot, and trialed stedy. Ultimately pt unable to clear hips from EOB position this session. Needs hoyer/maximove for OOB    Balance Overall balance assessment: Needs assistance Sitting-balance support: Bilateral upper extremity supported;Feet supported Sitting balance-Leahy Scale: Poor Sitting balance - Comments: min guard to mod A to sit EOB with BUE. R lateral lean. Pt with LOB when attempting dynamic tasks (turning around to see someone behind him). Some cueing for safety needed.  Postural control: Right lateral lean                                 ADL either performed or assessed with clinical judgement   ADL Overall ADL's : Needs assistance/impaired Eating/Feeding: NPO   Grooming: Minimal assistance;Sitting   Upper Body Bathing: Moderate assistance;Sitting   Lower Body Bathing: Max assistance;+2 for physical assistance;Sitting/lateral leans   Upper Body Dressing : Moderate assistance;Sitting   Lower Body Dressing: Max assistance;+2 for physical assistance;Sitting/lateral leans                 General ADL Comments: Pt completed bed mobility, sat EOB about 20 minutes. Attempted standing from EOB, squat-pivot transfer with +2 and stedy.  Pt with R lateral lean. Ultimately unable to clear hips R>L from EOB. Pt also reporting lightheadedness on standing attempt with stedy. Pt returned to supine and bp assessed.     Vision         Perception     Praxis      Pertinent Vitals/Pain Pain Assessment: Faces Faces Pain Scale: Hurts little more Pain Location: "my stomach", "it's empty" Pain Descriptors /  Indicators: Discomfort Pain Intervention(s): Monitored during session     Hand Dominance Right   Extremity/Trunk Assessment Upper Extremity Assessment Upper Extremity Assessment: Generalized weakness   Lower Extremity Assessment Lower Extremity Assessment: Defer to PT evaluation       Communication Communication Communication: Passy-Muir valve;No difficulties   Cognition Arousal/Alertness: Awake/alert Behavior During Therapy: WFL for tasks assessed/performed;Impulsive (mildly impulsive at times, some cueing for safety) Overall Cognitive Status: No family/caregiver present to determine baseline cognitive functioning                                 General Comments: answering questions appropriately. following commands. mildly impulsive (vs decreased awareness of deficits).    General Comments  Difficulty obtaining accurate bp reading sitting EOB. BP assessed once back in supine at 131/71. VSS at end of session.    Exercises     Shoulder Instructions      Home Living Family/patient expects to be discharged to:: Private residence Living Arrangements: Alone Available Help at Discharge: Family;Available PRN/intermittently Type of Home: Apartment Home Access: Level entry     Home Layout: One level     Bathroom Shower/Tub: Teacher, early years/pre: Handicapped height     Home Equipment: Toilet riser;Shower seat;Walker - 2 wheels;Cane - single point          Prior Functioning/Environment Level of Independence: Independent                 OT Problem List: Decreased strength;Decreased activity tolerance;Impaired balance (sitting and/or standing);Decreased cognition;Decreased knowledge of use of DME or AE;Decreased knowledge of precautions;Decreased safety awareness;Cardiopulmonary status limiting activity;Obesity;Pain;Increased edema      OT Treatment/Interventions: Self-care/ADL training;Therapeutic exercise;Energy conservation;DME  and/or AE instruction;Therapeutic activities;Patient/family education;Balance training    OT Goals(Current goals can be found in the care plan section) Acute Rehab OT Goals Patient Stated Goal: walk OT Goal Formulation: With patient Time For Goal Achievement: 09/17/19 Potential to Achieve Goals: Good ADL Goals Pt Will Perform Grooming: with set-up;sitting;with min guard assist Pt Will Perform Upper Body Bathing: with min guard assist;sitting Pt Will Perform Lower Body Bathing: with max assist;sit to/from stand;sitting/lateral leans Pt Will Transfer to Toilet: with max assist;with +2 assist;stand pivot transfer Pt Will Perform Toileting - Clothing Manipulation and hygiene: with max assist;sitting/lateral leans;sit to/from stand Pt/caregiver will Perform Home Exercise Program: Increased strength;Both right and left upper extremity;With theraband;Independently;With written HEP provided Additional ADL Goal #1: Pt will complete bed mobility at min A level to prepare for OOB/EOB ADLs  OT Frequency: Min 3X/week   Barriers to D/C:            Co-evaluation PT/OT/SLP Co-Evaluation/Treatment: Yes Reason for Co-Treatment: For patient/therapist safety;To address functional/ADL transfers;Complexity of the patient's impairments (multi-system involvement)   OT goals addressed during session: ADL's and self-care;Strengthening/ROM      AM-PAC OT "6 Clicks" Daily Activity     Outcome Measure Help from another person eating meals?: Total (NPO) Help from another person taking care of personal grooming?: A  Little Help from another person toileting, which includes using toliet, bedpan, or urinal?: Total Help from another person bathing (including washing, rinsing, drying)?: A Lot Help from another person to put on and taking off regular upper body clothing?: A Lot Help from another person to put on and taking off regular lower body clothing?: Total 6 Click Score: 10   End of Session Equipment  Utilized During Treatment: Oxygen;Gait belt;Other (comment) (trach collar and Dobbhoff tube, PMV) Nurse Communication: Mobility status;Need for lift equipment  Activity Tolerance: Other (comment);Patient tolerated treatment well (endorsed lightheaded in standing) Patient left: in bed;with call bell/phone within reach  OT Visit Diagnosis: Unsteadiness on feet (R26.81);Pain;Other abnormalities of gait and mobility (R26.89);Muscle weakness (generalized) (M62.81);Feeding difficulties (R63.3)                Time: 4734-0370 OT Time Calculation (min): 45 min Charges:  OT General Charges $OT Visit: 1 Visit OT Evaluation $OT Eval Moderate Complexity: [place], OT Acute Rehabilitation Services Pager: [phone] Office: [phone]   Hortencia Pilar 09/03/2019, 11:57 AM

## 2019-09-03 NOTE — Evaluation (Signed)
Physical Therapy Evaluation Patient Details Name: Timothy Le MRN: [id number] DOB: [birth year] Today's Date: 09/03/2019   History of Present Illness  59 year old African-American male with PMH significant for DM 2, HTN, COPD on home oxygen, morbid obesity, obstructive sleep apnea. Patient presented to the ED on 07/30/2019 with worsening shortness of breath. Initial work-up suggested pulmonary edema and decompensated diastolic heart failure. He was intubated in the ED and admitted to ICU.  He required mechanical ventilation for most of the hospitalization. On 8/11 he underwent total thyroidectomy and cervical lipectomy with suturing of skin flaps to to trachea for tracheostomy.   Clinical Impression  Pt admitted with above diagnosis. Pt was able to sit EOB x 20 minutes with min guard to mod assist varying if pt moving dynamiclly or staying static. Failed attempts to stand to RW and [place] as well as failed attempt to scoot laterally.  Ended up laying pt back down in bed as it wasn't safe to get him up.  Encouraged nursing to use Maximove to get pt OOB.  Will follow.  REcommend LTACH for pt.   Pt currently with functional limitations due to the deficits listed below (see PT Problem List). Pt will benefit from skilled PT to increase their independence and safety with mobility to allow discharge to the venue listed below.      Follow Up Recommendations LTACH;Supervision/Assistance - 24 hour    Equipment Recommendations  Other (comment) (TBA)    Recommendations for Other Services       Precautions / Restrictions Precautions Precautions: Fall Precaution Comments: trach collar and Dobbhoff tube, PMV Restrictions Weight Bearing Restrictions: No      Mobility  Bed Mobility Overal bed mobility: Needs Assistance Bed Mobility: Supine to Sit;Sit to Supine     Supine to sit: Mod assist;+2 for physical assistance;HOB elevated Sit to supine: Mod assist;+2 for physical assistance   General bed  mobility comments: assist fully powerup trunk and pivot hips to full EOB position. Pt relying on momentum and external leverage of rails vs therapists' arms. Pt needing assist to control decent into supine and fully advance BLE onto bed at end of session  Transfers Overall transfer level: Needs assistance Equipment used: Rolling walker (2 wheeled) Charlaine Dalton) Transfers: Sit to/from Stand;Lateral/Scoot Transfers Sit to Stand: Total assist;Max assist;+2 physical assistance;From elevated surface        Lateral/Scoot Transfers: Total assist;+2 physical assistance;From elevated surface General transfer comment: attempted with +2 assist to stand to RW and to [place]. Ultimately pt unable to stand to RW as he could not clear hips.  Pt did stand enough to stand partially to [facility] where PT got the left buttock pad to get under pt but pt was leaning right and could not move the right buttock pad.  since pt could not stand well enough for Stedy to work, decided to lateral scoot pt.  However pt could not lateral scoot to drop arm recliner either therrefore assist pt back into bed.   Needs maximove for OOB  Ambulation/Gait                Stairs            Wheelchair Mobility    Modified Rankin (Stroke Patients Only)       Balance Overall balance assessment: Needs assistance Sitting-balance support: Bilateral upper extremity supported;Feet supported Sitting balance-Leahy Scale: Poor Sitting balance - Comments: min guard to mod A to sit EOB with BUE. R lateral lean. Pt with LOB when attempting dynamic  tasks (turning around to see someone behind him). Some cueing for safety needed.  Postural control: Right lateral lean Standing balance support: Bilateral upper extremity supported;During functional activity Standing balance-Leahy Scale: Poor Standing balance comment: unable to achieve upright stance even with Stedy.                              Pertinent Vitals/Pain Pain  Assessment: Faces Faces Pain Scale: Hurts little more Pain Location: "my stomach", "it's empty" Pain Descriptors / Indicators: Discomfort Pain Intervention(s): Monitored during session;Limited activity within patient's tolerance;Repositioned    Home Living Family/patient expects to be discharged to:: Private residence Living Arrangements: Alone Available Help at Discharge: Family;Available PRN/intermittently Type of Home: Apartment Home Access: Level entry     Home Layout: One level Home Equipment: Toilet riser;Shower seat;Walker - 2 wheels;Cane - single point      Prior Function Level of Independence: Independent               Hand Dominance   Dominant Hand: Right    Extremity/Trunk Assessment   Upper Extremity Assessment Upper Extremity Assessment: Defer to OT evaluation    Lower Extremity Assessment Lower Extremity Assessment: RLE deficits/detail;LLE deficits/detail RLE Deficits / Details: grossly 3-/5 LLE Deficits / Details: grossly 3-/5       Communication   Communication: Passy-Muir valve;No difficulties  Cognition Arousal/Alertness: Awake/alert Behavior During Therapy: WFL for tasks assessed/performed;Impulsive (mildly impulsive at times, some cueing for safety) Overall Cognitive Status: No family/caregiver present to determine baseline cognitive functioning                                 General Comments: answering questions appropriately. following commands. mildly impulsive (vs decreased awareness of deficits).       General Comments General comments (skin integrity, edema, etc.): Difficulty obtaining accurate bp reading sitting EOB. BP assessed once back in supine at 131/71. VSS at end of session.    Exercises General Exercises - Lower Extremity Long Arc Quad: AROM;Both;5 reps;Seated   Assessment/Plan    PT Assessment    PT Problem List         PT Treatment Interventions      PT Goals (Current goals can be found in the  Care Plan section)  Acute Rehab PT Goals Patient Stated Goal: walk PT Goal Formulation: With patient Time For Goal Achievement: 09/17/19 Potential to Achieve Goals: Good    Frequency Min 3X/week   Barriers to discharge        Co-evaluation PT/OT/SLP Co-Evaluation/Treatment: Yes Reason for Co-Treatment: Complexity of the patient's impairments (multi-system involvement);For patient/therapist safety PT goals addressed during session: Mobility/safety with mobility         AM-PAC PT "6 Clicks" Mobility  Outcome Measure Help needed turning from your back to your side while in a flat bed without using bedrails?: A Lot Help needed moving from lying on your back to sitting on the side of a flat bed without using bedrails?: A Lot Help needed moving to and from a bed to a chair (including a wheelchair)?: Total Help needed standing up from a chair using your arms (e.g., wheelchair or bedside chair)?: Total Help needed to walk in hospital room?: Total Help needed climbing 3-5 steps with a railing? : Total 6 Click Score: 8    End of Session Equipment Utilized During Treatment: Gait belt;Oxygen (35% trach collar) Activity Tolerance: Patient  limited by fatigue Patient left: with call bell/phone within reach;in bed;with bed alarm set Nurse Communication: Mobility status;Need for lift equipment PT Visit Diagnosis: Unsteadiness on feet (R26.81);Other abnormalities of gait and mobility (R26.89)    Time: 1443-1540 PT Time Calculation (min) (ACUTE ONLY): 31 min   Charges:   PT Evaluation $PT Eval Moderate Complexity: 1 Mod          Ion Gonnella W,PT Acute Rehabilitation Services Pager:  [phone]  Office:  [phone]    Denice Paradise 09/03/2019, 2:11 PM

## 2019-09-03 NOTE — Progress Notes (Signed)
   NAME:  Timothy Le, MRN:  [id number], DOB:  [birth year], LOS: 23 ADMISSION DATE:  07/30/2019, CONSULTATION DATE:  07/30/2019 REFERRING MD:  Kathrynn Humble, CHIEF COMPLAINT:  Dyspnea   Brief History   59 year old male with baseline chronic respiratory failure on the basis of severe OHS/OSA complicated further by significant obstructive disease in the setting of COPD. Presents with acute on chronic hypoxic and hypercarbic respiratory failure. Admitted with the initial working diagnosis of pulmonary edema and decompensated diastolic heart failure. Per family patient was without CPAP for 2 weeks PTA as his machine broke. Has required mechanical ventilation for most of the hospitalization, extubated on 7/16, had stridor, unsuccessful re-intubation, had emergent tracheostomy which was difficult. On 7/24 he became agitated and his tracheostomy dislodged, required emergent re-intubation with following failed tracheostomy with cuff leak, now s/p tracheostomy revision with total thyroidectomy and cervical lipectomy.   Significant Hospital Events   PCCM called today to see patient acutely for a potential problem with his trach. See Truman Hayward RN notes.  Interim history/subjective:  Patient states breathing much improved after suctioning as nebs by RT  Objective   Blood pressure 103/78, pulse 93, temperature 98.3 F (36.8 C), temperature source Oral, resp. rate (!) 21, height 6' (1.829 m), weight (!) 144.8 kg, SpO2 (P) 98 %.    FiO2 (%):  [35 %] (P) 35 %   Intake/Output Summary (Last 24 hours) at 09/03/2019 1326 Last data filed at 09/03/2019 2800 Gross per 24 hour  Intake 2630 ml  Output 765 ml  Net 1865 ml   Filed Weights   08/31/19 0500 09/02/19 0500 09/03/19 0454  Weight: (!) 149.2 kg (!) 149.5 kg (!) 144.8 kg    Examination: General: NARD HENT: [place]/AT trach in place Lungs: Clear bilat Cardiovascular: RRR, no m/r/g Abdomen: Obese but NT Extremities: warm and dry Neuro: A&O. No focal  findings  Assessment & Plan:  Mucous plug in trach -removed with hydration and suctioning -increase humidification as much as possible to minimize lug formation -PCCM to follow  Critical care time: 15 min

## 2019-09-03 NOTE — Progress Notes (Signed)
1240: Patient c/o can't breath. Few minutes before gave suction, but couldn't get any mucous coming out. Patient wanted to sit at the edge of the bed and wheezing and stricter sounds. Paging RT for breathing treatment as well as rapid response nurses.  1247: Paging CCM regarding patient's condition. RT administered due nab treatment with suctioning. Rapid response nurse assessed patient.  1255: RT suctioned few mucous plugs. However, patient's HR was 110-120's. Administered another duo nab treatment. Patient c/o something stuck on his throat.  1305: Second neb treatment showed one mucous plug. Then patient was getting better and HR down to 100's. Patient wanted to lay down. HS [employer]

## 2019-09-04 ENCOUNTER — Inpatient Hospital Stay (HOSPITAL_COMMUNITY): Payer: Medicaid Other

## 2019-09-04 DIAGNOSIS — Z430 Encounter for attention to tracheostomy: Secondary | ICD-10-CM

## 2019-09-04 LAB — CBC WITH DIFFERENTIAL/PLATELET
Abs Immature Granulocytes: 0.01 10*3/uL (ref 0.00–0.07)
Basophils Absolute: 0 10*3/uL (ref 0.0–0.1)
Basophils Relative: 0 %
Eosinophils Absolute: 0.2 10*3/uL (ref 0.0–0.5)
Eosinophils Relative: 5 %
HCT: 29.9 % — ABNORMAL LOW (ref 39.0–52.0)
Hemoglobin: 9.1 g/dL — ABNORMAL LOW (ref 13.0–17.0)
Immature Granulocytes: 0 %
Lymphocytes Relative: 28 %
Lymphs Abs: 1.3 10*3/uL (ref 0.7–4.0)
MCH: 27.7 pg (ref 26.0–34.0)
MCHC: 30.4 g/dL (ref 30.0–36.0)
MCV: 91.2 fL (ref 80.0–100.0)
Monocytes Absolute: 0.3 10*3/uL (ref 0.1–1.0)
Monocytes Relative: 7 %
Neutro Abs: 2.8 10*3/uL (ref 1.7–7.7)
Neutrophils Relative %: 60 %
Platelets: 131 10*3/uL — ABNORMAL LOW (ref 150–400)
RBC: 3.28 MIL/uL — ABNORMAL LOW (ref 4.22–5.81)
RDW: 16.4 % — ABNORMAL HIGH (ref 11.5–15.5)
WBC: 4.8 10*3/uL (ref 4.0–10.5)
nRBC: 0 % (ref 0.0–0.2)

## 2019-09-04 LAB — COMPREHENSIVE METABOLIC PANEL
ALT: 16 U/L (ref 0–44)
AST: 18 U/L (ref 15–41)
Albumin: 2.6 g/dL — ABNORMAL LOW (ref 3.5–5.0)
Alkaline Phosphatase: 52 U/L (ref 38–126)
Anion gap: 11 (ref 5–15)
BUN: 19 mg/dL (ref 6–20)
CO2: 32 mmol/L (ref 22–32)
Calcium: 8.8 mg/dL — ABNORMAL LOW (ref 8.9–10.3)
Chloride: 98 mmol/L (ref 98–111)
Creatinine, Ser: 0.75 mg/dL (ref 0.61–1.24)
GFR calc Af Amer: >60 mL/min (ref >60)
GFR calc non Af Amer: >60 mL/min (ref >60)
Glucose, Bld: 117 mg/dL — ABNORMAL HIGH (ref 70–99)
Potassium: 3.8 mmol/L (ref 3.5–5.1)
Sodium: 141 mmol/L (ref 135–145)
Total Bilirubin: 0.7 mg/dL (ref 0.3–1.2)
Total Protein: 6.7 g/dL (ref 6.5–8.1)

## 2019-09-04 LAB — IRON AND TIBC
Iron: 39 ug/dL — ABNORMAL LOW (ref 45–182)
Saturation Ratios: 21 % (ref 17.9–39.5)
TIBC: 183 ug/dL — ABNORMAL LOW (ref 250–450)
UIBC: 144 ug/dL

## 2019-09-04 LAB — MAGNESIUM: Magnesium: 1.1 mg/dL — ABNORMAL LOW (ref 1.7–2.4)

## 2019-09-04 LAB — TRANSFERRIN: Transferrin: 131 mg/dL — ABNORMAL LOW (ref 180–329)

## 2019-09-04 LAB — GLUCOSE, CAPILLARY
Glucose-Capillary: 101 mg/dL — ABNORMAL HIGH (ref 70–99)
Glucose-Capillary: 105 mg/dL — ABNORMAL HIGH (ref 70–99)
Glucose-Capillary: 127 mg/dL — ABNORMAL HIGH (ref 70–99)
Glucose-Capillary: 146 mg/dL — ABNORMAL HIGH (ref 70–99)
Glucose-Capillary: 149 mg/dL — ABNORMAL HIGH (ref 70–99)
Glucose-Capillary: 81 mg/dL (ref 70–99)

## 2019-09-04 LAB — PHOSPHORUS: Phosphorus: 3.8 mg/dL (ref 2.5–4.6)

## 2019-09-04 LAB — FERRITIN: Ferritin: 232 ng/mL (ref 24–336)

## 2019-09-04 MED ORDER — MAGNESIUM SULFATE 4 GM/100ML IV SOLN
4.0000 g | Freq: Once | INTRAVENOUS | Status: AC
  Administered 2019-09-04: 4 g via INTRAVENOUS
  Filled 2019-09-04: qty 100

## 2019-09-04 NOTE — Progress Notes (Signed)
NAME:  Timothy Le, MRN:  [id number], DOB:  1960/03/27, LOS: 61 ADMISSION DATE:  07/30/2019, CONSULTATION DATE:  7/11 REFERRING MD:  Kathrynn Humble, CHIEF COMPLAINT:  Dyspnea   Brief History   59 year old male with baseline chronic respiratory failure on the basis of severe OHS/OSA complicated further by significant obstructive disease in the setting of COPD. Presents with acute on chronic hypoxic and hypercarbic respiratory failure. Admitted with the initial working diagnosis of pulmonary edema and decompensated diastolic heart failure. Per family patient was without CPAP for 2 weeks PTA as his machine broke. Has required mechanical ventilation for most of the hospitalization, extubated on 7/16, had stridor, unsuccessful re-intubation, had emergent tracheostomy which was difficult.  On 7/24 he became agitated and his tracheostomy dislodged, required emergent re-intubation with following failed tracheostomy with cuff leak, now s/p tracheostomy revision with total thyroidectomy and cervical lipectomy.   Past Medical History   Past Medical History:  Diagnosis Date  . Asthma   . Chronic respiratory failure with hypoxia ([place]) 07/09/2017  . COPD (chronic obstructive pulmonary disease) ([place])   . Diabetes mellitus without complication ([place])   . Difficult intubation   . Hypertension   . Shortness of breath dyspnea   . Sleep apnea    Significant Hospital Events   7/15-7/16> desat to 70s that resolved with suctioning; febrile  7/16> unsuccessful extubation due to stridor requiring emergent tracheostomy after unsuccessful re-intubation; noted to have R lung atelectasis 7/23> bronch mucus plug 7/24> tracheostomy dislodged due to severe agitation; required emergent reintubation 7/26> tracheostomy revision with ENT 7/30> ETT due to cuff leak 8/09>bronch mucus plug resulting in bradycardia and code blue 08/11>  total thyroidectomy and cervical lipectomy with suturing of skin flaps to to trachea for  tracheostomy 8/14> remained on trach collar overnight 8/16 on trach collar, PMV  Consults:  ENT   Procedures:  ET tube 7/11>7/16, 7/24> 7/26, 7/30> Trach 7/16>7/25 R IJ 7/11>   Significant Diagnostic Tests:  Echocardiogram 7/11 >> no evidence of RV strain; LVEF 50-55%.Left ventricular diastolic function could not be evaluated. Lower extremity ultrasound 7/11 >> no evidence of lower extremity DVT MRI brain 7/20 > normal MRI brain, extensive sinus mucosal disease, bilateral mastoid effusion, bilateral proptosis  Micro Data:  7/11 SARS COV 2> negative 7/13 resp culture > OPF 7/23 BAL LLL >staph epi  Antimicrobials:  Pip/tazo - 7/16>7/21 Cefepime 7/24 > 7/25 Ceftriaxone 7/24 > 7/27   Interim history/subjective:  Patient doing well this morning.  Able to communicate.  Watching TV.  No respiratory distress  Objective   Blood pressure 117/72, pulse 95, temperature 97.7 F (36.5 C), temperature source Oral, resp. rate (!) 21, height 6' (1.829 m), weight (!) 143.3 kg, SpO2 97 %.    FiO2 (%):  [35 %-40 %] 40 %   Intake/Output Summary (Last 24 hours) at 09/04/2019 0856 Last data filed at 09/04/2019 0538 Gross per 24 hour  Intake 1190 ml  Output 2075 ml  Net -885 ml   Filed Weights   09/02/19 0500 09/03/19 0454 09/04/19 0458  Weight: (!) 149.5 kg (!) 144.8 kg (!) 143.3 kg    Examination: General: Obese male, trach in place HENT: NCAT, tracking appropriately, trach in place Lungs: Distant bilateral breath sounds, no crackles Cardiovascular: Regular rate rhythm, S1-S2 no MRG Abdomen:, Nontender nondistended Extremities: No significant edema Neuro: Alert oriented following commands, patient moves extremities  Assessment & Plan:   Acute on chronic hypoxic and hypercarbic respiratory failure/status post tracheostomy Initially admitted for diastolic heart  failure exacerbation and has remained mostly mechanically vented throughout admission; prior tracheostomies have been  unsuccessful with most recent noted to have cuff leak on 7/30 requiring reintubation.  S/p total thyroidectomy and cervical lipectomy with suturing of skin flaps to to trachea for tracheostomy  08/11  Plan: -Continue PMV valve use -Continue humidification to help prevent mucus plugging. -Continue work with speech for advancement of diet. -Continue routine trach care. -Pulmonary will continue to follow once weekly for tracheostomy care  Diabetes mellitus Normocytic anemia Acute metabolic toxic encephalopathy, resolved  - per primary   Garner Nash, DO [facility] 09/04/2019 9:51 AM

## 2019-09-04 NOTE — Progress Notes (Signed)
Nutrition Follow-up  DOCUMENTATION CODES:   Morbid obesity  INTERVENTION:   Continue Tube Feeding via Cortrak:  Vital High Protein at 65 ml/hr Pro-Source TF 45 mL TID Provides 1680 kcals, 170 g of protein and 1310 mL of free water Meets 100% protein and calorie needs   NUTRITION DIAGNOSIS:   Inadequate oral intake related to inability to eat as evidenced by NPO status.  Ongoing  GOAL:   Patient will meet greater than or equal to 90% of their needs  Met with TF  MONITOR:   TF tolerance, Vent status, Weight trends, Labs, I & O's  REASON FOR ASSESSMENT:   Consult Enteral/tube feeding initiation and management  ASSESSMENT:   Pt with baseline chronic respiratory failure on the basis of severe OHS/OSA complicated further by significant obstructive disease in the setting of COPD.  Presents with acute on chronic hypoxic and hypercarbic respiratory failure.  Admitted with the initial working diagnosis of pulmonary edema and decompensated diastolic heart failure. PMH includes HTN, COPD, DM.  7/11 Intubated 7/16Extubated but developed stridor, attempted re-intubation unsuccessful, emergentTrach placed 7/21 Cortrak placed 7/23 Bronch, mucous plugg 7/24 Trach dislodged, emergently re-intubated 7/26 ENT replaced trach in OR 7/30 significant trach cuff leak, required oral intubation 8/9 bronch mucous plug resulting in bradycardia and code blue 8/11 s/p tracheostomy revision with total thyroidectomy and cervical lipectomy 8/14 neck drains removed  Per MD, PEG tube is being considered as SLP recommend pt remain NPO after evaluation on 8/14.   Patient is currently intubated on ventilator support MV: 9 L/min Temp (24hrs), Avg:98.4 F (36.9 C), Min:97.7 F (36.5 C), Max:98.7 F (37.1 C)  Current TF via Cortrak: Vital High Protein @ 16m/hr, 471mProsource TF TID  Labs: Mg 1.1 (L), CBGs 127-81-149 Medications: Colace, Novolog, Levemir, MVI, Protonix, Miralax  Diet Order:    Diet Order    None      EDUCATION NEEDS:   No education needs have been identified at this time  Skin:  Skin Assessment: Skin Integrity Issues: Skin Integrity Issues:: Stage II, Stage III, Incisions Stage II: sacrum Stage III: neck Incisions: neck x2  Last BM:  8/14 via rectal tube  Height:   Ht Readings from Last 1 Encounters:  07/30/19 6' (1.829 m)    Weight:   Wt Readings from Last 1 Encounters:  09/04/19 (!) 143.3 kg    Ideal Body Weight:  80.9 kg  BMI:  Body mass index is 42.85 kg/m.  Estimated Nutritional Needs:   Kcal:  153295-1884Protein:  160-200  Fluid:  >1.6L/d    AmLarkin InaMS, RD, LDN RD pager number and weekend/on-call pager number located in [place]

## 2019-09-04 NOTE — Plan of Care (Signed)
  Problem: Education: Goal: Knowledge of General Education information will improve Description: Including pain rating scale, medication(s)/side effects and non-pharmacologic comfort measures Outcome: Progressing   Problem: Health Behavior/Discharge Planning: Goal: Ability to manage health-related needs will improve Outcome: Progressing   Problem: Clinical Measurements: Goal: Ability to maintain clinical measurements within normal limits will improve Outcome: Progressing Goal: Will remain free from infection Outcome: Progressing Goal: Diagnostic test results will improve Outcome: Progressing Goal: Respiratory complications will improve Outcome: Progressing Goal: Cardiovascular complication will be avoided Outcome: Progressing   Problem: Activity: Goal: Risk for activity intolerance will decrease Outcome: Progressing   Problem: Nutrition: Goal: Adequate nutrition will be maintained Outcome: Progressing   Problem: Coping: Goal: Level of anxiety will decrease Outcome: Progressing   Problem: Elimination: Goal: Will not experience complications related to bowel motility Outcome: Progressing Goal: Will not experience complications related to urinary retention Outcome: Progressing   Problem: Pain Managment: Goal: General experience of comfort will improve Outcome: Progressing   Problem: Safety: Goal: Ability to remain free from injury will improve Outcome: Progressing   Problem: Skin Integrity: Goal: Risk for impaired skin integrity will decrease Outcome: Progressing   Problem: Activity: Goal: Ability to tolerate increased activity will improve Outcome: Progressing   Problem: Respiratory: Goal: Ability to maintain a clear airway and adequate ventilation will improve Outcome: Progressing   Problem: Role Relationship: Goal: Method of communication will improve Outcome: Progressing   Problem: Activity: Goal: Ability to tolerate increased activity will  improve Outcome: Progressing   Problem: Respiratory: Goal: Ability to maintain a clear airway and adequate ventilation will improve Outcome: Progressing

## 2019-09-04 NOTE — Progress Notes (Signed)
PROGRESS NOTE  Timothy Le  DOB: 03-03-1960  PCP: Kerin Perna, NP SAY:[id number]  DOA: 07/30/2019  LOS: 36 days   Chief complaint: Shortness of breath  Brief narrative: Patient is a 59 year old African-American male with PMH significant for DM 2, HTN, COPD on home oxygen, morbid obesity, obstructive sleep apnea. Patient presented to the ED on 07/30/2019 with worsening shortness of breath. Per family, patient was used to use CPAP nightly but it broke 2 weeks prior to presentation and hence he had not been using it.    Initial work-up suggested pulmonary edema and decompensated diastolic heart failure. He was intubated in the ED and admitted to ICU.  He required mechanical ventilation for most of the hospitalization.   7/16> he was extubated, had stridor, unsuccessful re-intubation, had emergent tracheostomy which was difficult. 7/23> underwent bronchoscopy because of mucous plug. 7/24> tracheostomy dislodged due to severe agitation; required emergent reintubation 7/26> tracheostomy revision with ENT 7/30> ETT due to cuff leak 8/09> bronch mucus plug resulting in bradycardia and code blue 8/11> total thyroidectomy and cervical lipectomy with suturing of skin flaps to to trachea for tracheostomy 8/14> neck drains removed by ENT.  Transferred out of ICU to hospitalist service on trach collar and Dobbhoff tube  Subjective: Patient was seen and examined this morning. Lying down in bed.  Not in distress.  Has core track with ongoing feeding. Breathing through trach collar. Middle-aged morbidly obese African-American male.  Propped up in bed.  Alert, awake, slow to respond but oriented.  Assessment/Plan: Acute on chronic hypoxic and hypercarbic respiratory failure, improved s/p tracheostomy -Initially intubated in ED for CHF exacerbation.  Patient has remained mostly mechanically vented throughout admission. -See above for the timeline of reintubation, tracheostomy and  thyroidectomy.  -Currently on 8 L/min of oxygen through tracheostomy collar -Continue to wean down as tolerated. -Continue PMV valve use, humidification to prevent mucus plugging -Continue as needed duo nebs.  Severe dysphagia -Speech therapy evaluation was obtained.  Per note from 8/14, patient has severe aspiration risk and has been recommended n.p.o. status. -Patient is currently on tube feeding through core track. -Discussed with speech therapy on 8/15.  Plan for barium swallow today  Acute exacerbation of chronic diastolic CHF Essential hypertension -Primary diagnosis on admission.  Appropriately treated with diuresis. -Home meds include Torsemide 5 mg daily, metolazone 2.5 mg daily, potassium 20 mg daily -Echocardiogram 7/11 >> no evidence of RV strain; LVEF 50-55%.Left ventricular diastolic function could not be evaluated. -Currently patient is not on any diuretics or other heart failure medications.  Once breathing status improves, we can initiate on low-dose beta-blockers.  Currently remains euvolemic. -Blood pressure mostly normal range.  Continue Cardura 4 mg daily.  IV hydralazine as needed.  Uncontrolled diabetes mellitus type 2 -A1c 14.9 on 7/15. -Home list shows Levemir 35 units at bedtime, NovoLog Premeal, Metformin 1000 mg twice daily.   -Currently patient has tube feeding on and he is getting Levemir 35 units twice daily, q4 hrs NovoLog and sliding scale insulin.  -Glucose trends seems satisfactory. Recent Labs  Lab 09/03/19 1632 09/03/19 1940 09/04/19 0009 09/04/19 0428 09/04/19 0811  GLUCAP 128* 87 127* 81 149*   Acute anemia of blood loss -Prior to this hospitalization, patient seems to have normal hemoglobin level. -Seems to be running hemoglobin mostly between 9-10 in the hospital. -8/11, patient received 2 units of PRBCs intraoperatively. -Currently on Protonix 40 mg daily. -Pending iron panel. Recent Labs    08/24/19 0516 08/25/19 0359 08/28/19  5176  08/29/19 0441 08/30/19 0258 08/30/19 1354 08/30/19 1533 08/31/19 0600 09/01/19 0527 09/04/19 0430  HGB 9.5* 9.1* 9.2* 9.0* 8.5* 10.2* 9.9* 8.9* 8.7* 9.1*   Acute metabolic encephalopathy -Patient had episodes of agitation likely related to hospitalization and respiratory failure -MRI brain 7/20 > normal MRI brain, extensive sinus mucosal disease, bilateral mastoid effusion, bilateral proptosis  -Continue with Zoloft and as needed oxycodone to prevent agitation and risk of tube dislodgment.  Hyperlipidemia -Continue Lipitor 10 mg daily.  History of gout -at home, patient was on allopurinol 100 mg daily  Mobility: PT eval obtained.  LTAC recommended. Code Status:   Code Status: Full Code  Nutritional status: Body mass index is 42.85 kg/m. Nutrition Problem: Inadequate oral intake Etiology: inability to eat Signs/Symptoms: NPO status Diet Order    None      DVT prophylaxis: SCDs Start: 07/31/19 1712   Antimicrobials:  Not on antibiotics Fluid: Not on normal saline.  Getting core track feeding  Consultants: Critical care for trach management Family Communication:  None at bedside  Status is: Inpatient  Remains inpatient appropriate because:Ongoing diagnostic testing needed not appropriate for outpatient work up and IV treatments appropriate due to intensity of illness or inability to take PO   Dispo: The patient is from: Home              Anticipated d/c is to: LTAC               Anticipated d/c date is: > 3 days              Patient currently is not medically stable to d/c.   Infusions:  . sodium chloride    . sodium chloride Stopped (08/30/19 1427)  . feeding supplement (VITAL HIGH PROTEIN) 1,000 mL (09/03/19 2214)    Scheduled Meds: . atorvastatin  10 mg Per Tube Daily  . chlorhexidine gluconate (MEDLINE KIT)  15 mL Mouth Rinse BID  . Chlorhexidine Gluconate Cloth  6 each Topical Daily  . docusate  100 mg Per Tube BID  . doxazosin  4 mg Per Tube Daily  .  enoxaparin (LOVENOX) injection  0.5 mg/kg Subcutaneous Q24H  . feeding supplement (PROSource TF)  45 mL Per Tube TID  . insulin aspart  0-20 Units Subcutaneous Q4H  . insulin aspart  5 Units Subcutaneous Q4H  . insulin detemir  35 Units Subcutaneous Q12H  . mouth rinse  15 mL Mouth Rinse 10 times per day  . melatonin  3 mg Per Tube QHS  . multivitamin  15 mL Per Tube Daily  . pantoprazole sodium  40 mg Per Tube QHS  . polyethylene glycol  17 g Per Tube Daily  . QUEtiapine  100 mg Per Tube BID  . sertraline  100 mg Per Tube Daily  . sodium chloride flush  10-40 mL Intracatheter Q12H    Antimicrobials: Anti-infectives (From admission, onward)   Start     Dose/Rate Route Frequency Ordered Stop   08/13/19 1000  cefTRIAXone (ROCEPHIN) 2 g in sodium chloride 0.9 % 100 mL IVPB  Status:  Discontinued        2 g 200 mL/hr over 30 Minutes Intravenous Every 24 hours 08/13/19 0912 08/15/19 1150   08/12/19 1000  ceFEPIme (MAXIPIME) 2 g in sodium chloride 0.9 % 100 mL IVPB  Status:  Discontinued        2 g 200 mL/hr over 30 Minutes Intravenous Every 8 hours 08/12/19 0939 08/13/19 0912   08/09/19 1700  piperacillin-tazobactam (ZOSYN) IVPB 3.375 g  Status:  Discontinued        3.375 g 12.5 mL/hr over 240 Minutes Intravenous Every 8 hours 08/08/19 1634 08/08/19 1707   08/08/19 1707  piperacillin-tazobactam (ZOSYN) IVPB 3.375 g  Status:  Discontinued        3.375 g 12.5 mL/hr over 240 Minutes Intravenous Every 8 hours 08/08/19 1707 08/09/19 1128   08/04/19 2200  piperacillin-tazobactam (ZOSYN) IVPB 3.375 g  Status:  Discontinued        3.375 g 12.5 mL/hr over 240 Minutes Intravenous Every 8 hours 08/04/19 1413 08/08/19 1634   08/04/19 1300  vancomycin (VANCOREADY) IVPB 1750 mg/350 mL        1,750 mg 175 mL/hr over 120 Minutes Intravenous  Once 08/04/19 1249 08/04/19 1631   08/01/19 1230  cefTRIAXone (ROCEPHIN) 2 g in sodium chloride 0.9 % 100 mL IVPB  Status:  Discontinued        2 g 200 mL/hr  over 30 Minutes Intravenous Every 24 hours 08/01/19 1226 08/04/19 1413   08/01/19 1230  azithromycin (ZITHROMAX) 500 mg in sodium chloride 0.9 % 250 mL IVPB  Status:  Discontinued        500 mg 250 mL/hr over 60 Minutes Intravenous Every 24 hours 08/01/19 1226 08/04/19 1413      PRN meds: Place/Maintain arterial line **AND** sodium chloride, hydrALAZINE, ipratropium-albuterol, oxyCODONE, sodium chloride flush   Objective: Vitals:   09/04/19 0857 09/04/19 1048  BP:    Pulse: 88   Resp:    Temp:    SpO2:  99%    Intake/Output Summary (Last 24 hours) at 09/04/2019 1127 Last data filed at 09/04/2019 0538 Gross per 24 hour  Intake 1110 ml  Output 2075 ml  Net -965 ml   Filed Weights   09/02/19 0500 09/03/19 0454 09/04/19 0458  Weight: (!) 149.5 kg (!) 144.8 kg (!) 143.3 kg   Weight change: -1.5 kg Body mass index is 42.85 kg/m.   Physical Exam: General exam: Morbidly obese, has trach collar and core track.  Appears calm and comfortable.  Not in physical distress Skin: No rashes, lesions or ulcers. HEENT: Atraumatic, normocephalic, supple neck, no obvious bleeding Lungs: Occasional rales bilaterally.   CVS: Heart rate in 90s, no murmur. GI/Abd soft, distended from obesity, nontender, bowel sound present. CNS: Alert, awake, slow to respond but oriented to time Psychiatry: Mood appropriate Extremities: No pedal edema, no calf tenderness.  Data Review: I have personally reviewed the laboratory data and studies available.  Recent Labs  Lab 08/29/19 0441 08/29/19 0441 08/30/19 0258 08/30/19 0258 08/30/19 1354 08/30/19 1533 08/31/19 0600 09/01/19 0527 09/04/19 0430  WBC 6.8  --  5.4  --   --   --  6.8 6.9 4.8  NEUTROABS  --   --   --   --   --   --   --   --  2.8  HGB 9.0*   < > 8.5*   < > 10.2* 9.9* 8.9* 8.7* 9.1*  HCT 30.3*   < > 28.8*   < > 30.0* 29.0* 29.8* 29.1* 29.9*  MCV 95.3  --  93.8  --   --   --  92.8 93.0 91.2  PLT 209  --  199  --   --   --  159 147*  131*   < > = values in this interval not displayed.   Recent Labs  Lab 08/29/19 0441 08/29/19 0441 08/30/19 0258 08/30/19 1354 08/30/19  1533 08/31/19 0600 09/01/19 0527 09/02/19 0450 09/04/19 0430  NA 139   < > 139   < > 140 137 138 140 141  K 3.8   < > 3.4*   < > 4.1 4.0 3.3* 3.6 3.8  CL 102   < > 102   < > 100 102 104 102 98  CO2 28   < > 28  --   --  '26 28 29 ' 32  GLUCOSE 118*   < > 94   < > 113* 141* 116* 95 117*  BUN 30*   < > 25*   < > '19 18 13 14 19  ' CREATININE 0.71   < > 0.67   < > 0.70 0.76 0.59* 0.67 0.75  CALCIUM 8.5*   < > 8.6*  --   --  8.5* 7.5* 8.3* 8.8*  MG 1.6*  --  1.6*  --   --  1.5*  --   --  1.1*  PHOS  --   --   --   --   --   --   --   --  3.8   < > = values in this interval not displayed.   Lab Results  Component Value Date   HGBA1C 14.9 (H) 08/03/2019       Component Value Date/Time   TRIG 91 08/23/2019 0456   Signed, Terrilee Croak, MD [facility] Pager: [phone] (Secure Chat preferred). 09/04/2019

## 2019-09-04 NOTE — Progress Notes (Signed)
Modified Barium Swallow Progress Note  Patient Details  Name: Timothy Le MRN: [id number] Date of Birth: 11-Jun-1960  Today's Date: 09/04/2019  Modified Barium Swallow completed.  Full report located under Chart Review in the Imaging Section.  Brief recommendations include the following:  Clinical Impression  Pt demonstrates adequate airway protection during swallow assessment, only impairment is mild vallecular packing during mastication. PMSV was in place. One instance of flash penetration seen between swallows, likely from oral residue transited to pharynx. Pt is able to resume a regular diet and thin liquids but must wear PMSV during oral intake. No SLP f/u needed for swallowing.    Swallow Evaluation Recommendations       SLP Diet Recommendations: Regular solids;Thin liquid   Liquid Administration via: Cup;Straw   Medication Administration: Whole meds with liquid   Supervision: Patient able to self feed   Compensations: Slow rate;Small sips/bites   Postural Changes: Seated upright at 90 degrees;Remain semi-upright after after feeds/meals (Comment)       Other Recommendations: Place PMSV during PO intake   Herbie Baltimore, MA CCC-SLP  Acute Rehabilitation Services Pager [phone] Office [phone]  Othelia Pulling, Katherene Ponto 09/04/2019,1:59 PM

## 2019-09-04 NOTE — Progress Notes (Signed)
Called to pt's room by RN who reported pt stating he was having difficulty breathing.  Initially, I tried to pass a suction catheter and was unsuccessful.  At that time, it was felt that the trach tube may be in a false track or somewhere other than than the airway.  In attempting to reposition the trach tube, the patient coughed it out completely.  A #14 gauge suction catheter was inserted through the stoma and into the trachea.  A #6 [place] Aire-cuff trach tube was inserted into the trachea using the suction catheter as a guide.  Once the trach tube was secured, placement was checked with EZ Cap CO2 detector and auscultation of bilateral breath sounds.  Throughout the whole incident, the patient's o2 saturations did not drop below 94%.  A chest xray will be obtained to further confirm tube placement.

## 2019-09-05 LAB — GLUCOSE, CAPILLARY
Glucose-Capillary: 103 mg/dL — ABNORMAL HIGH (ref 70–99)
Glucose-Capillary: 107 mg/dL — ABNORMAL HIGH (ref 70–99)
Glucose-Capillary: 119 mg/dL — ABNORMAL HIGH (ref 70–99)
Glucose-Capillary: 121 mg/dL — ABNORMAL HIGH (ref 70–99)
Glucose-Capillary: 134 mg/dL — ABNORMAL HIGH (ref 70–99)
Glucose-Capillary: 99 mg/dL (ref 70–99)

## 2019-09-05 MED ORDER — ENSURE MAX PROTEIN PO LIQD
11.0000 [oz_av] | Freq: Two times a day (BID) | ORAL | Status: DC
  Administered 2019-09-06 – 2019-09-15 (×18): 11 [oz_av] via ORAL
  Filled 2019-09-05 (×20): qty 330

## 2019-09-05 MED ORDER — ADULT MULTIVITAMIN W/MINERALS CH
1.0000 | ORAL_TABLET | Freq: Every day | ORAL | Status: DC
  Administered 2019-09-06 – 2019-09-15 (×10): 1 via ORAL
  Filled 2019-09-05 (×10): qty 1

## 2019-09-05 MED ORDER — GABAPENTIN 100 MG PO CAPS
200.0000 mg | ORAL_CAPSULE | Freq: Three times a day (TID) | ORAL | Status: DC
  Administered 2019-09-05 – 2019-09-15 (×29): 200 mg via ORAL
  Filled 2019-09-05 (×29): qty 2

## 2019-09-05 MED ORDER — TORSEMIDE 20 MG PO TABS
10.0000 mg | ORAL_TABLET | Freq: Every day | ORAL | Status: DC
  Administered 2019-09-05 – 2019-09-15 (×11): 10 mg via ORAL
  Filled 2019-09-05 (×11): qty 1

## 2019-09-05 MED ORDER — ALLOPURINOL 100 MG PO TABS
100.0000 mg | ORAL_TABLET | Freq: Every day | ORAL | Status: DC
  Administered 2019-09-05 – 2019-09-15 (×11): 100 mg via ORAL
  Filled 2019-09-05 (×11): qty 1

## 2019-09-05 MED ORDER — JUVEN PO PACK
1.0000 | PACK | Freq: Two times a day (BID) | ORAL | Status: DC
  Administered 2019-09-06 – 2019-09-15 (×19): 1 via ORAL
  Filled 2019-09-05 (×20): qty 1

## 2019-09-05 MED ORDER — METOPROLOL TARTRATE 12.5 MG HALF TABLET
12.5000 mg | ORAL_TABLET | Freq: Two times a day (BID) | ORAL | Status: DC
  Administered 2019-09-05 – 2019-09-15 (×20): 12.5 mg via ORAL
  Filled 2019-09-05 (×20): qty 1

## 2019-09-05 MED ORDER — ALUM & MAG HYDROXIDE-SIMETH 200-200-20 MG/5ML PO SUSP
30.0000 mL | ORAL | Status: DC | PRN
  Administered 2019-09-05: 30 mL via ORAL
  Filled 2019-09-05 (×2): qty 30

## 2019-09-05 NOTE — Progress Notes (Addendum)
  Speech Language Pathology Treatment: Nada Boozer Speaking valve  Patient Details Name: Timothy Le MRN: [id number] DOB: 1960-10-28 Today's Date: 09/05/2019 Time: 8828-0034 SLP Time Calculation (min) (ACUTE ONLY): 22 min  Assessment / Plan / Recommendation Clinical Impression  Pt was alert and coopertive throughout the session. He indicated that he was told that he should not eat with PMSV in place. He was advised that this was recommended following yesterday's swallow study and that this is still the recommendation. He verbalized understanding and this was reiterated to RN; PMSV sign updated in room. A regular texture diet was recommended by speech pathology following yesterday's swallow study. However, pt indicated that after yesterday's respiratory difficulty he would rather "take it easy" and be on soft solids instead. Vitals were HR 94, SpO2 94, and RR 17 at baseline and his cuff was deflated upon the SLP's entry. Pt was able to voaclize and produce short utterances prior to PMSV placement. He tolerated PMSV for 20 minutes with vitals range HR 93-94, SpO2 94-100, and RR 15-20 during this period and pt denied any respiratory difficulty. Vocal quality was WNL; however, he exhibited some difficulty adequately coordinating respiration with speech for production of sentences. Vocal intensity was [facility]. He was able to participate in conversation with the SLP with only occasional need for clarification. PMSV was removed at the end of the session and pt requested tracheal suctioning; RN was notified of this. It is recommended that PMSV be used with staff and that supervision be provided. SLP will continue to follow pt.    HPI HPI: 59 year old male with baseline chronic respiratory failure on the basis of severe OHS/OSA complicated further by significant obstructive disease in the setting of COPD.  Presents with acute on chronic hypoxic and hypercarbic respiratory failure. Has required mechanical  ventilation for most of the hospitalization, extubated on 7/16, had stridor, unsuccessful re-intubation, had emergent tracheostomy which was difficult.  On 7/24 he became agitated and his tracheostomy dislodged, required emergent re-intubation with following failed tracheostomy with cuff leak, now s/p tracheostomy revision with total thyroidectomy and cervical lipectomy.  Pt has a hx of a trach and oropharyngeal dysphagia in 2019 with two FEES completed during that admission.       SLP Plan  Continue with current plan of care       Recommendations  Diet recommendations: Regular;Thin liquid (Pt reported that he prefers soft food after yesterday's epis) Liquids provided via: Cup;Straw Medication Administration: Whole meds with liquid Supervision: Patient able to self feed Compensations: Slow rate;Small sips/bites Postural Changes and/or Swallow Maneuvers: Seated upright 90 degrees      Patient may use Passy-Muir Speech Valve: During all therapies with supervision;Intermittently with supervision PMSV Supervision: Full MD: Please consider changing trach tube to : Smaller size;Cuffless (when appropriate)         Oral Care Recommendations: Oral care BID;Staff/trained caregiver to provide oral care Follow up Recommendations: LTACH;Inpatient Rehab SLP Visit Diagnosis: Aphonia (R49.1) Plan: Continue with current plan of care       Paz Winsett I. Hardin Negus, [place], [place] Office number [phone] Pager [place] 09/05/2019, 5:00 PM

## 2019-09-05 NOTE — Progress Notes (Signed)
Nutrition Follow-up  DOCUMENTATION CODES:   Morbid obesity  INTERVENTION:   - Ensure Max po BID, each supplement provides 150 kcal and 30 grams of protein  - 1 packet Juven BID, each packet provides 95 calories, 2.5 grams of protein, and 9.8 grams of carbohydrate; also contains 7 grams of L-arginine and L-glutamine, 300 mg vitamin C, 15 mg vitamin E, 1.2 mcg vitamin B-12, 9.5 mg zinc, 200 mg calcium, and 1.5 g calcium Beta-hydroxy-Beta-methylbutyrate to support wound healing  - Change liquid MVI with MVI with minerals daily  NUTRITION DIAGNOSIS:   Inadequate oral intake related to inability to eat as evidenced by NPO status.  Progressing, pt now on soft diet  GOAL:   Patient will meet greater than or equal to 90% of their needs  Progressing  MONITOR:   PO intake, Supplement acceptance, Labs, Weight trends, Skin, I & O's  REASON FOR ASSESSMENT:   Consult Enteral/tube feeding initiation and management  ASSESSMENT:   Pt with baseline chronic respiratory failure on the basis of severe OHS/OSA complicated further by significant obstructive disease in the setting of COPD.  Presents with acute on chronic hypoxic and hypercarbic respiratory failure.  Admitted with the initial working diagnosis of pulmonary edema and decompensated diastolic heart failure. PMH includes HTN, COPD, DM.  7/11 - intubated 7/16- extubated but developed stridor, attempted re-intubation unsuccessful, emergenttrach placed 7/21 - Cortrak placed 7/23 - s/p bronch, mucous plug 7/24 - trach dislodged, emergently re-intubated 7/26 - ENT replaced trach in OR 7/30 - significant trach cuff leak, required oral intubation 8/09 - bronch mucous plug resulting in bradycardia and code blue 8/11 - s/p tracheostomy revision with total thyroidectomy and cervical lipectomy 8/13 - trach collar 8/14 - neck drains removed 8/16 - MBS, diet advanced to soft with thin liquids  Cortrak remains in place, TF stopped this AM  per MD. RD will d/c TF orders. Per MD, if pt tolerates diet over the next 24 hours can have Cortrak d/c.  Spoke with pt at bedside. Pt with lunch meal tray and reports he is waiting for assistance from RN in getting set up to each lunch. Per RN note, pt doing well with soft diet. No meal completions documented. RD will order oral nutrition supplements to aid pt in meting kcal and protein needs.  Current TF: Vital High Protein @ 65 ml/hr, ProSource TF 45 ml TID  Medications reviewed and include: colace, SSI q 4 hours, Novolog 5 units q 4 hours, Levemir 35 units q 12 hours, liquid MVI, protonix, miralax  Labs reviewed: magnesium 1.1, hemoglobin 9.1 CBG's: 99-146 x 24 hours  Diet Order:   Diet Order            DIET SOFT Room service appropriate? Yes; Fluid consistency: Thin; Fluid restriction: 1500 mL Fluid  Diet effective now                 EDUCATION NEEDS:   No education needs have been identified at this time  Skin:  Skin Assessment: Skin Integrity Issues: Stage II: sacrum Stage III: neck Incisions: neck x 2  Last BM:  09/04/19 via rectal tube  Height:   Ht Readings from Last 1 Encounters:  07/30/19 6' (1.829 m)    Weight:   Wt Readings from Last 1 Encounters:  09/04/19 (!) 143.3 kg    Ideal Body Weight:  80.9 kg  BMI:  Body mass index is 42.85 kg/m.  Estimated Nutritional Needs:   Kcal:  7096-2836  Protein:  120-140 grams  Fluid:  1.8 L    Gaynell Face, MS, RD, LDN Inpatient Clinical Dietitian Please see [facility] for contact information.

## 2019-09-05 NOTE — Progress Notes (Signed)
PROGRESS NOTE  Timothy Le  DOB: [birth year]  PCP: Kerin Perna, NP ZCH:[id number]  DOA: 07/30/2019  LOS: 37 days   Chief complaint: Shortness of breath  Brief narrative: Patient is a 59 year old African-American male with PMH significant for DM 2, HTN, COPD on home oxygen, morbid obesity, obstructive sleep apnea. Patient presented to the ED on 07/30/2019 with worsening shortness of breath. Per family, patient was used to use CPAP nightly but it broke 2 weeks prior to presentation and hence he had not been using it.    Initial work-up suggested pulmonary edema and decompensated diastolic heart failure. He was intubated in the ED and admitted to ICU.  He required mechanical ventilation for most of the hospitalization.   7/16> he was extubated, had stridor, unsuccessful re-intubation, had emergent tracheostomy which was difficult. 7/23> underwent bronchoscopy because of mucous plug. 7/24> tracheostomy dislodged due to severe agitation; required emergent reintubation 7/26> tracheostomy revision with ENT 7/30> ETT due to cuff leak 8/09> bronch mucus plug resulting in bradycardia and code blue 8/11> total thyroidectomy and cervical lipectomy with suturing of skin flaps to to trachea for tracheostomy 8/14> neck drains removed by ENT.  Transferred out of ICU to hospitalist service on trach collar and Dobbhoff tube 8/16> modified barium swallow -patient showed adequate airway protection.  Switch from Cortrak tube feeding to regular consistency diet.  Subjective: Patient was seen and examined this morning. Sitting up in chair.  Not in distress. Noted event of tracheostomy dislodgment and reinsertion yesterday afternoon. Also, patient underwent a speech evaluation yesterday.  Started on regular diet. Patient still had ongoing Cortrak tube feeding which I stopped this morning. Heart rate in 90s overnight. T-max of 99. Blood pressure between 100-120s. Complains of pins-and-needles  in his legs today.  Assessment/Plan: Acute on chronic hypoxic and hypercarbic respiratory failure, improved s/p tracheostomy -Initially intubated in ED for CHF exacerbation.  Patient has remained mostly mechanically vented throughout admission. -See above for the timeline of reintubation, tracheostomy and thyroidectomy.  -Currently on 8-10 L/min of oxygen through tracheostomy collar -Continue to wean down as tolerated. -Continue PMV valve use, humidification to prevent mucus plugging -Continue as needed duo nebs.  Dysphagia -improved -Speech therapy evaluation appreciated.   -8/16, modified barium swallow -patient showed adequate airway protection.  Switch from Cortrak tube feeding to regular consistency diet. -In next 24 hours, patient is able to tolerate oral feeding, can remove Cortrak tube  Acute exacerbation of chronic diastolic CHF Essential hypertension -CHF exacerbation was in the primary diagnosis on admission.  Appropriately treated with diuresis. -Echocardiogram 7/11 >> no evidence of RV strain; LVEF 50-55%.Left ventricular diastolic function could not be evaluated. -Home meds include Torsemide 5 mg daily, metolazone 2.5 mg daily, potassium 20 mg daily. -Currently blood pressure, renal function and electrolytes are stable on Cardura 4 mg daily.  Not on a beta-blocker or diuretics.  -Because of coexisting CHF, I will resume the patient on torsemide 5 mg daily and add metoprolol 12.5 mg twice daily.  Hold Cardura. -IV hydralazine as needed for elevated blood pressure.  Uncontrolled diabetes mellitus type 2 -A1c 14.9 on 7/15. -Home list shows Levemir 35 units at bedtime, NovoLog Premeal, Metformin 1000 mg twice daily.   -Currently patient is getting Levemir 35 units twice daily, q4 hrs NovoLog and sliding scale insulin.  -Glucose trends seems satisfactory.  May need to reduce insulin total daily dose off tube feeding. Recent Labs  Lab 09/04/19 1657 09/04/19 2021  09/05/19 2878 09/05/19 6767 09/05/19 2094  GLUCAP 146* 101* 107* 134* 99   Acute anemia of blood loss -Prior to this hospitalization, patient seems to have normal hemoglobin level. -Hemoglobin seems to be running mostly between 9-10 in the hospital. -8/11, patient received 2 units of PRBCs intraoperatively. -Hemoglobin trend as below. -Currently on Protonix 40 mg daily. -8/16 -iron level 39, ferritin level 232 Recent Labs    08/24/19 0516 08/25/19 0359 08/28/19 0416 08/29/19 0441 08/30/19 0258 08/30/19 1354 08/30/19 1533 08/31/19 0600 09/01/19 0527 09/04/19 0430  HGB 9.5* 9.1* 9.2* 9.0* 8.5* 10.2* 9.9* 8.9* 8.7* 9.1*   Acute metabolic encephalopathy -improved -Patient had episodes of agitation likely related to hospitalization and respiratory failure -MRI brain 7/20 > normal MRI brain, extensive sinus mucosal disease, bilateral mastoid effusion, bilateral proptosis  -Mental status normal now. -Continue with Zoloft and as needed oxycodone to prevent agitation and risk of tube dislodgment.  Hyperlipidemia -Continue Lipitor 10 mg daily.  Peripheral neuropathy -Complains of pins-and-needles in both feet today.  Start on Neurontin 200 mg daily.  History of gout -at home, patient was on allopurinol 100 mg daily  Mobility: PT eval obtained.  LTAC was recommended.  However patient will qualify for it since he will not need core track.   Code Status:   Code Status: Full Code  Nutritional status: Body mass index is 42.85 kg/m. Nutrition Problem: Inadequate oral intake Etiology: inability to eat Signs/Symptoms: NPO status Diet Order            DIET SOFT Room service appropriate? Yes; Fluid consistency: Thin; Fluid restriction: 1500 mL Fluid  Diet effective now                 DVT prophylaxis: SCDs Start: 07/31/19 1712   Antimicrobials:  Not on antibiotics Fluid: None  Consultants: Critical care for trach management Family Communication:  None at bedside  Status  is: Inpatient  Remains inpatient appropriate because:Ongoing diagnostic testing needed not appropriate for outpatient work up and IV treatments appropriate due to intensity of illness or inability to take PO   Dispo: The patient is from: Home              Anticipated d/c is to: Likely SNF              Anticipated d/c date is: > 3 days              Patient currently is not medically stable to d/c.  Infusions:  . sodium chloride    . sodium chloride Stopped (08/30/19 1427)  . feeding supplement (VITAL HIGH PROTEIN) Stopped (09/05/19 1038)    Scheduled Meds: . allopurinol  100 mg Oral Daily  . atorvastatin  10 mg Per Tube Daily  . chlorhexidine gluconate (MEDLINE KIT)  15 mL Mouth Rinse BID  . Chlorhexidine Gluconate Cloth  6 each Topical Daily  . docusate  100 mg Per Tube BID  . enoxaparin (LOVENOX) injection  0.5 mg/kg Subcutaneous Q24H  . feeding supplement (PROSource TF)  45 mL Per Tube TID  . gabapentin  200 mg Oral TID  . insulin aspart  0-20 Units Subcutaneous Q4H  . insulin aspart  5 Units Subcutaneous Q4H  . insulin detemir  35 Units Subcutaneous Q12H  . mouth rinse  15 mL Mouth Rinse 10 times per day  . melatonin  3 mg Per Tube QHS  . metoprolol tartrate  12.5 mg Oral BID  . multivitamin  15 mL Per Tube Daily  . pantoprazole sodium  40 mg Per  Tube QHS  . polyethylene glycol  17 g Per Tube Daily  . QUEtiapine  100 mg Per Tube BID  . sertraline  100 mg Per Tube Daily  . sodium chloride flush  10-40 mL Intracatheter Q12H  . torsemide  10 mg Oral Daily    Antimicrobials: Anti-infectives (From admission, onward)   Start     Dose/Rate Route Frequency Ordered Stop   08/13/19 1000  cefTRIAXone (ROCEPHIN) 2 g in sodium chloride 0.9 % 100 mL IVPB  Status:  Discontinued        2 g 200 mL/hr over 30 Minutes Intravenous Every 24 hours 08/13/19 0912 08/15/19 1150   08/12/19 1000  ceFEPIme (MAXIPIME) 2 g in sodium chloride 0.9 % 100 mL IVPB  Status:  Discontinued        2  g 200 mL/hr over 30 Minutes Intravenous Every 8 hours 08/12/19 0939 08/13/19 0912   08/09/19 1700  piperacillin-tazobactam (ZOSYN) IVPB 3.375 g  Status:  Discontinued        3.375 g 12.5 mL/hr over 240 Minutes Intravenous Every 8 hours 08/08/19 1634 08/08/19 1707   08/08/19 1707  piperacillin-tazobactam (ZOSYN) IVPB 3.375 g  Status:  Discontinued        3.375 g 12.5 mL/hr over 240 Minutes Intravenous Every 8 hours 08/08/19 1707 08/09/19 1128   08/04/19 2200  piperacillin-tazobactam (ZOSYN) IVPB 3.375 g  Status:  Discontinued        3.375 g 12.5 mL/hr over 240 Minutes Intravenous Every 8 hours 08/04/19 1413 08/08/19 1634   08/04/19 1300  vancomycin (VANCOREADY) IVPB 1750 mg/350 mL        1,750 mg 175 mL/hr over 120 Minutes Intravenous  Once 08/04/19 1249 08/04/19 1631   08/01/19 1230  cefTRIAXone (ROCEPHIN) 2 g in sodium chloride 0.9 % 100 mL IVPB  Status:  Discontinued        2 g 200 mL/hr over 30 Minutes Intravenous Every 24 hours 08/01/19 1226 08/04/19 1413   08/01/19 1230  azithromycin (ZITHROMAX) 500 mg in sodium chloride 0.9 % 250 mL IVPB  Status:  Discontinued        500 mg 250 mL/hr over 60 Minutes Intravenous Every 24 hours 08/01/19 1226 08/04/19 1413      PRN meds: Place/Maintain arterial line **AND** sodium chloride, hydrALAZINE, ipratropium-albuterol, oxyCODONE, sodium chloride flush   Objective: Vitals:   09/05/19 0430 09/05/19 0754  BP:  108/68  Pulse:  91  Resp: (!) 24 12  Temp:  99.4 F (37.4 C)  SpO2:  93%    Intake/Output Summary (Last 24 hours) at 09/05/2019 1053 Last data filed at 09/05/2019 0500 Gross per 24 hour  Intake 1885 ml  Output 600 ml  Net 1285 ml   Filed Weights   09/02/19 0500 09/03/19 0454 09/04/19 0458  Weight: (!) 149.5 kg (!) 144.8 kg (!) 143.3 kg   Weight change:  Body mass index is 42.85 kg/m.   Physical Exam: General exam: Morbidly obese, has trach collar and Cortrak tube.  Appears calm and comfortable. Not in physical distress   Skin: No rashes, lesions or ulcers. HEENT: Atraumatic, normocephalic, supple neck, no obvious bleeding Lungs: Occasional rales bilaterally.   CVS: Heart rate in 90s, no murmur. GI/Abd soft, distended from obesity, nontender, bowel sound present. CNS: Alert, awake, slow to respond but oriented to time. Psychiatry: Mood appropriate Extremities: No pedal edema, no calf tenderness.  Pins-and-needles feeling on both soles on touch  Data Review: I have personally reviewed the laboratory data  and studies available.  Recent Labs  Lab 08/30/19 0258 08/30/19 0258 08/30/19 1354 08/30/19 1533 08/31/19 0600 09/01/19 0527 09/04/19 0430  WBC 5.4  --   --   --  6.8 6.9 4.8  NEUTROABS  --   --   --   --   --   --  2.8  HGB 8.5*   < > 10.2* 9.9* 8.9* 8.7* 9.1*  HCT 28.8*   < > 30.0* 29.0* 29.8* 29.1* 29.9*  MCV 93.8  --   --   --  92.8 93.0 91.2  PLT 199  --   --   --  159 147* 131*   < > = values in this interval not displayed.   Recent Labs  Lab 08/30/19 0258 08/30/19 1354 08/30/19 1533 08/31/19 0600 09/01/19 0527 09/02/19 0450 09/04/19 0430  NA 139   < > 140 137 138 140 141  K 3.4*   < > 4.1 4.0 3.3* 3.6 3.8  CL 102   < > 100 102 104 102 98  CO2 28  --   --  '26 28 29 ' 32  GLUCOSE 94   < > 113* 141* 116* 95 117*  BUN 25*   < > '19 18 13 14 19  ' CREATININE 0.67   < > 0.70 0.76 0.59* 0.67 0.75  CALCIUM 8.6*  --   --  8.5* 7.5* 8.3* 8.8*  MG 1.6*  --   --  1.5*  --   --  1.1*  PHOS  --   --   --   --   --   --  3.8   < > = values in this interval not displayed.   Lab Results  Component Value Date   HGBA1C 14.9 (H) 08/03/2019       Component Value Date/Time   TRIG 91 08/23/2019 0456   Signed, Terrilee Croak, MD [facility] Pager: [phone] (Secure Chat preferred). 09/05/2019

## 2019-09-05 NOTE — Progress Notes (Signed)
Physical Therapy Treatment Patient Details Name: Timothy Le MRN: [id number] DOB: [birth year] Today's Date: 09/05/2019    History of Present Illness 59 year old African-American male with PMH significant for DM 2, HTN, COPD on home oxygen, morbid obesity, obstructive sleep apnea. Patient presented to the ED on 07/30/2019 with worsening shortness of breath. Initial work-up suggested pulmonary edema and decompensated diastolic heart failure. He was intubated in the ED and admitted to ICU.  He required mechanical ventilation for most of the hospitalization. On 8/11 he underwent total thyroidectomy and cervical lipectomy with suturing of skin flaps to to trachea for tracheostomy.     PT Comments    Pt very pleasant and very eager to work toward standing this session. Pt able to put weight through his legs but unable to fully stand x 3 trials. Pt with significantly improved bed mobility and sitting balance. Pt on RA throughout session with SpO2 92-96%. Pt very happy to be OOB and moving legs in seated. Pt educated for progression and continued need to work toward standing. Will continue to follow.  108/68 (79) 96 HR    Follow Up Recommendations  LTACH;Supervision/Assistance - 24 hour     Equipment Recommendations  Other (comment) (TBD)    Recommendations for Other Services       Precautions / Restrictions Precautions Precautions: Fall Precaution Comments: trach collar and Dobbhoff tube Restrictions Weight Bearing Restrictions: No    Mobility  Bed Mobility Overal bed mobility: Needs Assistance Bed Mobility: Supine to Sit     Supine to sit: Min assist     General bed mobility comments: pt able to transfer from supine to sit with use of rail and momentum with cues for sequence  Transfers Overall transfer level: Needs assistance   Transfers: Sit to/from Stand           General transfer comment: pt attempted sit to stand from elevated bed surface x 3 trials with 2 person  assist with bil knees blocked and pt able to clear sacrum but unable to fully rise from surface. pt transferred via [place] from EOB to chair  Ambulation/Gait             General Gait Details: not yet able   Stairs             Wheelchair Mobility    Modified Rankin (Stroke Patients Only)       Balance Overall balance assessment: Needs assistance   Sitting balance-Leahy Scale: Good Sitting balance - Comments: pt able to sit EOB without physical assist, with perturbation need for single UE support       Standing balance comment: unable to achieve upright stance with 2 person assist                            Cognition Arousal/Alertness: Awake/alert Behavior During Therapy: [facility] for tasks assessed/performed;Impulsive Overall Cognitive Status: Within Functional Limits for tasks assessed                                        Exercises General Exercises - Lower Extremity Long Arc Quad: AROM;Both;Seated;15 reps Hip Flexion/Marching: AROM;Both;15 reps;Seated    General Comments        Pertinent Vitals/Pain Pain Assessment: No/denies pain    Home Living  Prior Function            PT Goals (current goals can now be found in the care plan section) Progress towards PT goals: Progressing toward goals    Frequency    Min 3X/week      PT Plan Current plan remains appropriate    Co-evaluation              AM-PAC PT "6 Clicks" Mobility   Outcome Measure  Help needed turning from your back to your side while in a flat bed without using bedrails?: A Little Help needed moving from lying on your back to sitting on the side of a flat bed without using bedrails?: A Little Help needed moving to and from a bed to a chair (including a wheelchair)?: Total Help needed standing up from a chair using your arms (e.g., wheelchair or bedside chair)?: Total Help needed to walk in hospital room?:  Total Help needed climbing 3-5 steps with a railing? : Total 6 Click Score: 10    End of Session Equipment Utilized During Treatment: Gait belt Activity Tolerance: Patient tolerated treatment well Patient left: in chair;with call bell/phone within reach;with chair alarm set Nurse Communication: Mobility status PT Visit Diagnosis: Unsteadiness on feet (R26.81);Other abnormalities of gait and mobility (R26.89)     Time: 0034-9179 PT Time Calculation (min) (ACUTE ONLY): 36 min  Charges:  $Therapeutic Exercise: 8-22 mins $Therapeutic Activity: 8-22 mins                     Doriann Zuch P, PT Acute Rehabilitation Services Pager: [phone] Office: [place] [facility] 09/05/2019, 11:16 AM

## 2019-09-05 NOTE — Progress Notes (Signed)
Patient now on soft diet and was able to tolerate oral meds with water without complication. Patient doing well on soft diet.

## 2019-09-06 DIAGNOSIS — J9621 Acute and chronic respiratory failure with hypoxia: Secondary | ICD-10-CM | POA: Diagnosis not present

## 2019-09-06 DIAGNOSIS — J449 Chronic obstructive pulmonary disease, unspecified: Secondary | ICD-10-CM

## 2019-09-06 DIAGNOSIS — J988 Other specified respiratory disorders: Secondary | ICD-10-CM | POA: Diagnosis not present

## 2019-09-06 DIAGNOSIS — J9622 Acute and chronic respiratory failure with hypercapnia: Secondary | ICD-10-CM | POA: Diagnosis not present

## 2019-09-06 DIAGNOSIS — E1165 Type 2 diabetes mellitus with hyperglycemia: Secondary | ICD-10-CM

## 2019-09-06 DIAGNOSIS — Z930 Tracheostomy status: Secondary | ICD-10-CM

## 2019-09-06 LAB — GLUCOSE, CAPILLARY
Glucose-Capillary: 108 mg/dL — ABNORMAL HIGH (ref 70–99)
Glucose-Capillary: 111 mg/dL — ABNORMAL HIGH (ref 70–99)
Glucose-Capillary: 113 mg/dL — ABNORMAL HIGH (ref 70–99)
Glucose-Capillary: 125 mg/dL — ABNORMAL HIGH (ref 70–99)
Glucose-Capillary: 156 mg/dL — ABNORMAL HIGH (ref 70–99)
Glucose-Capillary: 179 mg/dL — ABNORMAL HIGH (ref 70–99)

## 2019-09-06 LAB — CBC WITH DIFFERENTIAL/PLATELET
Abs Immature Granulocytes: 0.02 10*3/uL (ref 0.00–0.07)
Basophils Absolute: 0 10*3/uL (ref 0.0–0.1)
Basophils Relative: 0 %
Eosinophils Absolute: 0.2 10*3/uL (ref 0.0–0.5)
Eosinophils Relative: 2 %
HCT: 30.1 % — ABNORMAL LOW (ref 39.0–52.0)
Hemoglobin: 9.1 g/dL — ABNORMAL LOW (ref 13.0–17.0)
Immature Granulocytes: 0 %
Lymphocytes Relative: 27 %
Lymphs Abs: 2 10*3/uL (ref 0.7–4.0)
MCH: 27.2 pg (ref 26.0–34.0)
MCHC: 30.2 g/dL (ref 30.0–36.0)
MCV: 90.1 fL (ref 80.0–100.0)
Monocytes Absolute: 0.3 10*3/uL (ref 0.1–1.0)
Monocytes Relative: 5 %
Neutro Abs: 4.9 10*3/uL (ref 1.7–7.7)
Neutrophils Relative %: 66 %
Platelets: 151 10*3/uL (ref 150–400)
RBC: 3.34 MIL/uL — ABNORMAL LOW (ref 4.22–5.81)
RDW: 15.9 % — ABNORMAL HIGH (ref 11.5–15.5)
WBC: 7.4 10*3/uL (ref 4.0–10.5)
nRBC: 0 % (ref 0.0–0.2)

## 2019-09-06 LAB — BASIC METABOLIC PANEL
Anion gap: 5 (ref 5–15)
BUN: 11 mg/dL (ref 6–20)
CO2: 23 mmol/L (ref 22–32)
Calcium: 6.2 mg/dL — CL (ref 8.9–10.3)
Chloride: 114 mmol/L — ABNORMAL HIGH (ref 98–111)
Creatinine, Ser: 0.46 mg/dL — ABNORMAL LOW (ref 0.61–1.24)
GFR calc Af Amer: >60 mL/min (ref >60)
GFR calc non Af Amer: >60 mL/min (ref >60)
Glucose, Bld: 86 mg/dL (ref 70–99)
Potassium: 2.5 mmol/L — CL (ref 3.5–5.1)
Sodium: 142 mmol/L (ref 135–145)

## 2019-09-06 LAB — MAGNESIUM
Magnesium: 1 mg/dL — ABNORMAL LOW (ref 1.7–2.4)
Magnesium: 1.6 mg/dL — ABNORMAL LOW (ref 1.7–2.4)

## 2019-09-06 LAB — POTASSIUM: Potassium: 3.8 mmol/L (ref 3.5–5.1)

## 2019-09-06 MED ORDER — MELATONIN 3 MG PO TABS
3.0000 mg | ORAL_TABLET | Freq: Every day | ORAL | Status: DC
  Administered 2019-09-06 – 2019-09-14 (×9): 3 mg via ORAL
  Filled 2019-09-06 (×9): qty 1

## 2019-09-06 MED ORDER — QUETIAPINE FUMARATE 100 MG PO TABS
100.0000 mg | ORAL_TABLET | Freq: Two times a day (BID) | ORAL | Status: DC
  Administered 2019-09-06 – 2019-09-15 (×19): 100 mg via ORAL
  Filled 2019-09-06 (×11): qty 1
  Filled 2019-09-06: qty 2
  Filled 2019-09-06 (×7): qty 1

## 2019-09-06 MED ORDER — ATORVASTATIN CALCIUM 10 MG PO TABS
10.0000 mg | ORAL_TABLET | Freq: Every day | ORAL | Status: DC
  Administered 2019-09-06 – 2019-09-15 (×10): 10 mg via ORAL
  Filled 2019-09-06 (×10): qty 1

## 2019-09-06 MED ORDER — LIDOCAINE HCL (PF) 1 % IJ SOLN
INTRAMUSCULAR | Status: AC
  Administered 2019-09-06: 5 mL
  Filled 2019-09-06: qty 5

## 2019-09-06 MED ORDER — DM-GUAIFENESIN ER 30-600 MG PO TB12
1.0000 | ORAL_TABLET | Freq: Two times a day (BID) | ORAL | Status: DC
  Administered 2019-09-06 – 2019-09-15 (×19): 1 via ORAL
  Filled 2019-09-06 (×20): qty 1

## 2019-09-06 MED ORDER — INSULIN DETEMIR 100 UNIT/ML ~~LOC~~ SOLN
30.0000 [IU] | Freq: Two times a day (BID) | SUBCUTANEOUS | Status: DC
  Administered 2019-09-07 – 2019-09-14 (×14): 30 [IU] via SUBCUTANEOUS
  Filled 2019-09-06 (×18): qty 0.3

## 2019-09-06 MED ORDER — MAGNESIUM SULFATE 50 % IJ SOLN
6.0000 g | Freq: Once | INTRAVENOUS | Status: AC
  Administered 2019-09-06: 6 g via INTRAVENOUS
  Filled 2019-09-06: qty 12

## 2019-09-06 MED ORDER — PANTOPRAZOLE SODIUM 40 MG PO PACK: 40.0000 mg | PACK | Freq: Every day | ORAL | Status: DC

## 2019-09-06 MED ORDER — SODIUM CHLORIDE 3 % IN NEBU
4.0000 mL | INHALATION_SOLUTION | Freq: Two times a day (BID) | RESPIRATORY_TRACT | Status: DC
  Administered 2019-09-06 – 2019-09-14 (×16): 4 mL via RESPIRATORY_TRACT
  Filled 2019-09-06 (×21): qty 4

## 2019-09-06 MED ORDER — SERTRALINE HCL 100 MG PO TABS
100.0000 mg | ORAL_TABLET | Freq: Every day | ORAL | Status: DC
  Administered 2019-09-06 – 2019-09-15 (×10): 100 mg via ORAL
  Filled 2019-09-06 (×6): qty 1
  Filled 2019-09-06: qty 2
  Filled 2019-09-06 (×3): qty 1

## 2019-09-06 MED ORDER — DOCUSATE SODIUM 50 MG/5ML PO LIQD: 100.0000 mg | Freq: Two times a day (BID) | ORAL | Status: DC

## 2019-09-06 MED ORDER — PANTOPRAZOLE SODIUM 40 MG PO TBEC
40.0000 mg | DELAYED_RELEASE_TABLET | Freq: Every day | ORAL | Status: DC
  Administered 2019-09-06 – 2019-09-14 (×9): 40 mg via ORAL
  Filled 2019-09-06 (×9): qty 1

## 2019-09-06 MED ORDER — DOCUSATE SODIUM 100 MG PO CAPS
100.0000 mg | ORAL_CAPSULE | Freq: Two times a day (BID) | ORAL | Status: DC
  Administered 2019-09-06 – 2019-09-10 (×8): 100 mg via ORAL
  Filled 2019-09-06 (×8): qty 1

## 2019-09-06 MED FILL — Medication: Qty: 1 | Status: AC

## 2019-09-06 NOTE — Progress Notes (Signed)
Asked to sit at the edge of the bed. While sitting became short of breath, diaphoretic and restless. Attempted to suction trache but with difficulty. Respiratory therapist .. called. Heart went down to 30's pulse ox- 77%. Noted with faint pulse, cardiac compression done briefly, called for code blue came back awake and alert but restless.Primary made aware, transferred to MICU room 4 by bed. Report given to Rn. Belongings endorsed.

## 2019-09-06 NOTE — Consult Note (Signed)
NAME:  Timothy Le, MRN:  [id number], DOB:  11/15/1960, LOS: 53 ADMISSION DATE:  07/30/2019, CONSULTATION DATE: 09/06/19 REFERRING MD:  Desiree Hane, MD, CHIEF COMPLAINT:  "can't breathe"  Brief History   Patient is a 59 year old African-American male with PMH significant for DM 2, HTN, COPD on home oxygen, morbid obesity, obstructive sleep apnea. Patient presented to the ED on 07/30/2019 with worsening shortness of breath likely in the setting of acute exacerbation of heart failure subsequently intubated for acute on chronic hypoxemic/hypercarbic respiratory failure due to OHS/OSA found to have large thyroid goiter contributing s/p tracheostomy attempts with subsequent tracheostomy failure s/p thyroidectomy and secure trach 08/30/19 with ENT who is transferred to ICU 09/05/19 after acute episode of desaturation and bradycardia due to mucous plug.   History of present illness   Acute respiratory distress. HR dipped to 20s per report. Chest compressions started but never lost pulse per report. RT came to bedside and suctioned thick mucous with quick relief of distress. Came to 38M. Report of resistance at end of trach. Bronch performed that showed patent trach without granuloma or obstructing mucous/debris. Carina visualized and clear.  Past Medical History   Past Medical History:  Diagnosis Date  . Asthma   . Chronic respiratory failure with hypoxia ([place]) 07/09/2017  . COPD (chronic obstructive pulmonary disease) ([place])   . Diabetes mellitus without complication ([place])   . Difficult intubation   . Hypertension   . Shortness of breath dyspnea   . Sleep apnea     Significant Hospital Events   7/16> he was extubated, had stridor, unsuccessful re-intubation, had emergent tracheostomy which was difficult. 7/23> underwent bronchoscopy because of mucous plug. 7/24> tracheostomy dislodged due to severe agitation; required emergent reintubation 7/26> tracheostomy revision with ENT 7/30> ETT  due to cuff leak 8/09> bronch mucus plug resulting in bradycardia and code blue 8/11>total thyroidectomy and cervical lipectomy with suturing of skin flaps to to trachea for tracheostomy 8/14> neck drains removed by ENT.  Transferred out of ICU to hospitalist service on trach collar and Dobbhoff tube 8/16> modified barium swallow -patient showed adequate airway protection.  Switch from Cortrak tube feeding to regular consistency diet. 8/18>> mucous plug, transferred to ICU  Consults:  pccm  Procedures:  PICC Thyroidectomy  Significant Diagnostic Tests:  As per EMR  Micro Data:  As per EMR  Antimicrobials:  n/a  Interim history/subjective:  See above  Objective   Blood pressure 106/67, pulse 88, temperature 97.8 F (36.6 C), temperature source Oral, resp. rate 20, height 6' (1.829 m), weight (!) 145.6 kg, SpO2 99 %.    FiO2 (%):  [28 %-40 %] 28 %   Intake/Output Summary (Last 24 hours) at 09/06/2019 0939 Last data filed at 09/06/2019 0600 Gross per 24 hour  Intake --  Output 2500 ml  Net -2500 ml   Filed Weights   09/03/19 0454 09/04/19 0458 09/06/19 0700  Weight: (!) 144.8 kg (!) 143.3 kg (!) 145.6 kg    Examination: General: obese, in NAD HENT: trach well seated, no drainage Lungs: Coarse, CTAB Cardiovascular: tachy, regular rhythm Abdomen: soft, NT Extremities: full range of motion, no edema Neuro: alert, communicating with raspy voice   Resolved Hospital Problem list     Assessment & Plan:  Acute on chronic hypercarbic and hypoxemic respiratory failure: Felt to be due to OHS/OSA with more recent contribution to narrowed airway from large thyroid goiter.  Acute event prompting transfer to ICU on 8/18 due to mucous plug  that was resolved with inline suctioning. --Continue trach collar --We will add hypertonic saline for airway clearance in order to try to prevent further plugging events although these are difficult to fully prevent  Chronic diastolic  heart failure: Appears euvolemic at this time --Continue torsemide and metoprolol  Diabetes: Continue long-acting Levemir and sliding scale insulin as well as supplemental insulin with tube feeds.  As p.o. intake improves may need to change from every 4 hours to with meals.     Best practice:  Diet: per SLP, oral diet Pain/Anxiety/Delirium protocol (if indicated): seroquel VAP protocol (if indicated): chlorhexidine DVT prophylaxis: heparin GI prophylaxis: n/a Glucose control: in range Mobility: OOB to cheir per PT Code Status: full Family Communication: daily update as possible Disposition: ICU care, if stable transfer out this PM  Labs   CBC: Recent Labs  Lab 08/30/19 1354 08/30/19 1533 08/31/19 0600 09/01/19 0527 09/04/19 0430  WBC  --   --  6.8 6.9 4.8  NEUTROABS  --   --   --   --  2.8  HGB 10.2* 9.9* 8.9* 8.7* 9.1*  HCT 30.0* 29.0* 29.8* 29.1* 29.9*  MCV  --   --  92.8 93.0 91.2  PLT  --   --  159 147* 131*    Basic Metabolic Panel: Recent Labs  Lab 08/30/19 1533 08/31/19 0600 09/01/19 0527 09/02/19 0450 09/04/19 0430  NA 140 137 138 140 141  K 4.1 4.0 3.3* 3.6 3.8  CL 100 102 104 102 98  CO2  --  26 28 29  32  GLUCOSE 113* 141* 116* 95 117*  BUN 19 18 13 14 19   CREATININE 0.70 0.76 0.59* 0.67 0.75  CALCIUM  --  8.5* 7.5* 8.3* 8.8*  MG  --  1.5*  --   --  1.1*  PHOS  --   --   --   --  3.8   GFR: Estimated Creatinine Clearance: 151 mL/min (by C-G formula based on SCr of 0.75 mg/dL). Recent Labs  Lab 08/31/19 0600 09/01/19 0527 09/04/19 0430  WBC 6.8 6.9 4.8    Liver Function Tests: Recent Labs  Lab 09/04/19 0430  AST 18  ALT 16  ALKPHOS 52  BILITOT 0.7  PROT 6.7  ALBUMIN 2.6*   No results for input(s): LIPASE, AMYLASE in the last 168 hours. No results for input(s): AMMONIA in the last 168 hours.  ABG    Component Value Date/Time   PHART 7.444 08/24/2019 0514   PCO2ART 50.3 (H) 08/24/2019 0514   PO2ART 123 (H) 08/24/2019 0514    HCO3 34.4 (H) 08/24/2019 0514   TCO2 26 08/30/2019 1533   ACIDBASEDEF 1.0 07/30/2019 1724   O2SAT 99.0 08/24/2019 0514     Coagulation Profile: No results for input(s): INR, PROTIME in the last 168 hours.  Cardiac Enzymes: No results for input(s): CKTOTAL, CKMB, CKMBINDEX, TROPONINI in the last 168 hours.  HbA1C: Hgb A1c MFr Bld  Date/Time Value Ref Range Status  08/03/2019 01:22 PM 14.9 (H) 4.8 - 5.6 % Final    Comment:    (NOTE) Pre diabetes:          5.7%-6.4%  Diabetes:              >6.4%  Glycemic control for   <7.0% adults with diabetes   03/04/2019 07:08 AM 16.6 (H) 4.8 - 5.6 % Final    Comment:    (NOTE) Pre diabetes:          5.7%-6.4% Diabetes:              >  6.4% Glycemic control for   <7.0% adults with diabetes     CBG: Recent Labs  Lab 09/05/19 1213 09/05/19 1649 09/05/19 2022 09/06/19 0002 09/06/19 0403  GLUCAP 119* 121* 103* 156* 111*    Review of Systems:   Comprehensive review of systems unable to obtain due to patient lacking Passy-Muir valve and some difficulty understanding responses  Past Medical History  He,  has a past medical history of Asthma, Chronic respiratory failure with hypoxia ([place]) (07/09/2017), COPD (chronic obstructive pulmonary disease) ([place]), Diabetes mellitus without complication ([place]), Difficult intubation, Hypertension, Shortness of breath dyspnea, and Sleep apnea.   Surgical History    Past Surgical History:  Procedure Laterality Date  . IR GASTROSTOMY TUBE MOD SED  07/05/2017  . THYROIDECTOMY Left 08/30/2019   Procedure: THYROIDECTOMY CERVICAL LIPECTOMY;  Surgeon: Izora Gala, MD;  Location: [facility];  Service: ENT;  Laterality: Left;  NEEDS RNFA PLEASE  . TRACHEOSTOMY TUBE PLACEMENT N/A 06/17/2017   Procedure: TRACHEOSTOMY;  Surgeon: Leta Baptist, MD;  Location: [facility];  Service: ENT;  Laterality: N/A;  . TRACHEOSTOMY TUBE PLACEMENT N/A 08/14/2019   Procedure: TRACHEOSTOMY;  Surgeon: Izora Gala, MD;  Location: [facility];   Service: ENT;  Laterality: N/A;  . TRACHEOSTOMY TUBE PLACEMENT N/A 08/30/2019   Procedure: TRACHEOSTOMY  REVISION;  Surgeon: Izora Gala, MD;  Location: [facility];  Service: ENT;  Laterality: N/A;     Social History   reports that he quit smoking about 6 years ago. His smoking use included cigarettes. He started smoking about 36 years ago. He has a 0.25 pack-year smoking history. He has never used smokeless tobacco. He reports that he does not drink alcohol and does not use drugs.   Family History   His family history includes Asthma in his sister and another family member; Diabetes type II in his sister; Hypertension in his sister.   Allergies No Known Allergies   Home Medications  Prior to Admission medications   Medication Sig Start Date End Date Taking? Authorizing Provider  allopurinol (ZYLOPRIM) 100 MG tablet Take 100 mg by mouth daily.   Yes [provider]  atorvastatin (LIPITOR) 10 MG tablet Take 10 mg by mouth daily.   Yes [provider]  budesonide-formoterol (SYMBICORT) 160-4.5 MCG/ACT inhaler Inhale 2 puffs then rinse mouth, twice daiy Patient taking differently: Inhale 2 puffs into the lungs 2 (two) times daily. Rinse mouth after use 01/24/19  Yes Young, Clinton D, MD  desonide (DESOWEN) 0.05 % lotion Apply 1 application topically daily. Apply to face   Yes [provider]  insulin aspart (NOVOLOG FLEXPEN) 100 UNIT/ML FlexPen Inject 8 Units into the skin 3 (three) times daily with meals. 03/06/19  Yes Marianna Payment, MD  insulin glargine (LANTUS) 100 unit/mL SOPN Inject 0.35 mLs (35 Units total) into the skin at bedtime. 03/06/19 09/07/19 Yes Marianna Payment, MD  ketoconazole (NIZORAL) 2 % cream Apply 1 application topically daily. Apply to wounds   Yes [provider]  metFORMIN (GLUCOPHAGE) 1000 MG tablet Take 1 tablet (1,000 mg total) by mouth 2 (two) times daily with a meal. 03/06/19 03/05/20 Yes Chundi, Vahini, MD  metolazone (ZAROXOLYN) 2.5 MG  tablet Take 2.5 mg by mouth daily.   Yes [provider]  potassium chloride SA (K-DUR,KLOR-CON) 20 MEQ tablet Take 1 tablet (20 mEq total) by mouth daily. 07/29/17  Yes Angiulli, Lavon Paganini, PA-C  sertraline (ZOLOFT) 100 MG tablet Take 100 mg by mouth daily.    Yes [provider]  torsemide (DEMADEX) 5 MG tablet Take 5 mg by mouth daily. 01/17/19  Yes [provider]  Insulin Pen Needle (BD ULTRA-FINE PEN NEEDLES) 29G X 12.7MM MISC USE AS DIRECTED WITH LANTUS 12/23/17   [provider]     Critical care time: 76     CRITICAL CARE Performed by: Lanier Clam   Total critical care time: 31 minutes  Critical care time was exclusive of separately billable procedures and treating other patients.  Critical care was necessary to treat or prevent imminent or life-threatening deterioration.  Critical care was time spent personally by me on the following activities: development of treatment plan with patient and/or surrogate as well as nursing, discussions with consultants, evaluation of patient's response to treatment, examination of patient, obtaining history from patient or surrogate, ordering and performing treatments and interventions, ordering and review of laboratory studies, ordering and review of radiographic studies, pulse oximetry and re-evaluation of patient's condition.

## 2019-09-06 NOTE — Progress Notes (Signed)
[facility]  PROGRESS NOTE  Timothy Le CWU:[id number] DOB: 17-Jun-1960 DOA: 07/30/2019 PCP: Kerin Perna, NP Admit date - 07/30/2019   Admitting Physician Jacky Kindle, MD  Outpatient Primary MD for the patient is Kerin Perna, NP  LOS - 50 Brief Narrative   Timothy Le is a 59 y.o. year old male with medical history significant for type 2 diabetes, HTN, COPD on home oxygen, morbid obesity, OSA who presented on 07/30/2019 with worsening shortness of breath or being off his CPAP for the past 2 weeks (per family it was broken) was found to have acute on chronic hypoxemic/hypercapnic respiratory failure due to OHS/OSA complicated by large thyroid goiter.  Patient required intubation.  Hospital course complicated by tracheostomy failure requiring total thyroidectomy and cervical lipectomy and trach revision with ENT on 8/11.  Patient was transferred to stepdown unit on the morning of 8/18 due to episode of desaturation and associated bradycardia with heart rate in the 20s presumably due to mucous plugging.  Chest compressions were started at that time per report patient did not lose pulse.  Bronchoscopy was performed by PCCM on arrival to stepdown unit with no evidence of obstructive mucus or debris and clear visualization of the carina  Subjective  Today transferred to unit due to respiratory distress and bradycardia Feels much better now after getting suctioning and bronchoscopy Still coughing, but unsure of appearance A & P  Acute on chronic hypoxic hypercapnic respiratory failure.  Presumed secondary to obesity hypoventilation syndrome/obstructive sleep apnea as well as further complication due to large thyroid goiter.  Coloscopy 9/18 nonrevealing, suspect mucous plug because of acute hypoxic event on a.m. of 8/18 that resolved with inline suctioning -Close monitoring in stepdown unit, -Hypertonic saline for airway clearance to prevent further plugging events per  PCCM  Chronic diastolic CHF, euvolemic on exam -continue torsemide 10 mg daily, -Holding home Cardura, started on Lopressor during hospital stay   HLD, stable-continue Lipitor  OSA/OHS -Continue Mucinex  Peripheral neuropathy -Continue Neurontin, started on hospitalization  Type 2 diabetes, poorly controlled A1c 14.9 -Holding home Metformin -Continue Levemir 35 units twice daily, coverage insulin while on  GERD, stable continue Protonix  Depression, stable continue Zoloft, Seroquel  Gout, stable continue allopurinol  Acute metabolic encephalopathy, resolved.  Has had episodes of agitation like related to prolonged hospitalization, hospital-acquired delirium and metabolic dysfunction related to respiratory failure.  MRI brain showed no acute abnormalities.     Family Communication  : None Code Status : Full  Disposition Plan  :  Patient is from home. Anticipated d/c date: 2 to 3 days. Barriers to d/c or necessity for inpatient status:  Close monitoring of respiratory status given patient now back in stepdown unit for close monitoring after recent hypoxic event, continue trach care, PT recommends LTAC suspect may be able to be SNF if able to maintain normal p.o. status Consults  : PCCM  Procedures  :    DVT Prophylaxis  :  Lovenox  Lab Results  Component Value Date   PLT 131 (L) 09/04/2019    Diet :  Diet Order            DIET SOFT Room service appropriate? Yes; Fluid consistency: Thin; Fluid restriction: 1500 mL Fluid  Diet effective now                  Inpatient Medications Scheduled Meds: . allopurinol  100 mg Oral Daily  . atorvastatin  10 mg Per Tube Daily  .  chlorhexidine gluconate (MEDLINE KIT)  15 mL Mouth Rinse BID  . Chlorhexidine Gluconate Cloth  6 each Topical Daily  . docusate  100 mg Per Tube BID  . enoxaparin (LOVENOX) injection  0.5 mg/kg Subcutaneous Q24H  . gabapentin  200 mg Oral TID  . insulin aspart  0-20 Units Subcutaneous Q4H  .  insulin aspart  5 Units Subcutaneous Q4H  . insulin detemir  35 Units Subcutaneous Q12H  . lidocaine (PF)      . mouth rinse  15 mL Mouth Rinse 10 times per day  . melatonin  3 mg Per Tube QHS  . metoprolol tartrate  12.5 mg Oral BID  . multivitamin with minerals  1 tablet Oral Daily  . nutrition supplement (JUVEN)  1 packet Oral BID BM  . pantoprazole sodium  40 mg Per Tube QHS  . polyethylene glycol  17 g Per Tube Daily  . Ensure Max Protein  11 oz Oral BID  . QUEtiapine  100 mg Per Tube BID  . sertraline  100 mg Per Tube Daily  . sodium chloride flush  10-40 mL Intracatheter Q12H  . torsemide  10 mg Oral Daily   Continuous Infusions: . sodium chloride    . sodium chloride Stopped (08/30/19 1427)   PRN Meds:.Place/Maintain arterial line **AND** sodium chloride, alum & mag hydroxide-simeth, hydrALAZINE, ipratropium-albuterol, oxyCODONE, sodium chloride flush  Antibiotics  :   Anti-infectives (From admission, onward)   Start     Dose/Rate Route Frequency Ordered Stop   08/13/19 1000  cefTRIAXone (ROCEPHIN) 2 g in sodium chloride 0.9 % 100 mL IVPB  Status:  Discontinued        2 g 200 mL/hr over 30 Minutes Intravenous Every 24 hours 08/13/19 0912 08/15/19 1150   08/12/19 1000  ceFEPIme (MAXIPIME) 2 g in sodium chloride 0.9 % 100 mL IVPB  Status:  Discontinued        2 g 200 mL/hr over 30 Minutes Intravenous Every 8 hours 08/12/19 0939 08/13/19 0912   08/09/19 1700  piperacillin-tazobactam (ZOSYN) IVPB 3.375 g  Status:  Discontinued        3.375 g 12.5 mL/hr over 240 Minutes Intravenous Every 8 hours 08/08/19 1634 08/08/19 1707   08/08/19 1707  piperacillin-tazobactam (ZOSYN) IVPB 3.375 g  Status:  Discontinued        3.375 g 12.5 mL/hr over 240 Minutes Intravenous Every 8 hours 08/08/19 1707 08/09/19 1128   08/04/19 2200  piperacillin-tazobactam (ZOSYN) IVPB 3.375 g  Status:  Discontinued        3.375 g 12.5 mL/hr over 240 Minutes Intravenous Every 8 hours 08/04/19 1413 08/08/19  1634   08/04/19 1300  vancomycin (VANCOREADY) IVPB 1750 mg/350 mL        1,750 mg 175 mL/hr over 120 Minutes Intravenous  Once 08/04/19 1249 08/04/19 1631   08/01/19 1230  cefTRIAXone (ROCEPHIN) 2 g in sodium chloride 0.9 % 100 mL IVPB  Status:  Discontinued        2 g 200 mL/hr over 30 Minutes Intravenous Every 24 hours 08/01/19 1226 08/04/19 1413   08/01/19 1230  azithromycin (ZITHROMAX) 500 mg in sodium chloride 0.9 % 250 mL IVPB  Status:  Discontinued        500 mg 250 mL/hr over 60 Minutes Intravenous Every 24 hours 08/01/19 1226 08/04/19 1413       Objective   Vitals:   09/06/19 0700 09/06/19 0727 09/06/19 0800 09/06/19 0900  BP:   133/85 106/67  Pulse:  89 (!) 117 88  Resp:  17 (!) 26 20  Temp:      TempSrc:      SpO2:  100% 99% 99%  Weight: (!) 145.6 kg     Height:        SpO2: 99 % O2 Flow Rate (L/min): 5 L/min FiO2 (%): 28 %  Wt Readings from Last 3 Encounters:  09/06/19 (!) 145.6 kg  05/24/19 (!) 152 kg  03/03/19 (!) 156.5 kg     Intake/Output Summary (Last 24 hours) at 09/06/2019 0928 Last data filed at 09/06/2019 0600 Gross per 24 hour  Intake --  Output 2500 ml  Net -2500 ml    Physical Exam:     Awake Alert, Oriented X 3, Normal affect No new F.N deficits,  Trach in place with PMV cap on  Normal respiratory effort on 13 L , decreased breath sounds, CTAB RRR,No Gallops,Rubs or new Murmurs,  +ve B.Sounds, Abd Soft, No tenderness, No rebound, guarding or rigidity. No Cyanosis, No new Rash or bruise     I have personally reviewed the following:   Data Reviewed:  CBC Recent Labs  Lab 08/30/19 1354 08/30/19 1533 08/31/19 0600 09/01/19 0527 09/04/19 0430  WBC  --   --  6.8 6.9 4.8  HGB 10.2* 9.9* 8.9* 8.7* 9.1*  HCT 30.0* 29.0* 29.8* 29.1* 29.9*  PLT  --   --  159 147* 131*  MCV  --   --  92.8 93.0 91.2  MCH  --   --  27.7 27.8 27.7  MCHC  --   --  29.9* 29.9* 30.4  RDW  --   --  18.2* 17.5* 16.4*  LYMPHSABS  --   --   --   --   1.3  MONOABS  --   --   --   --  0.3  EOSABS  --   --   --   --  0.2  BASOSABS  --   --   --   --  0.0    Chemistries  Recent Labs  Lab 08/30/19 1533 08/31/19 0600 09/01/19 0527 09/02/19 0450 09/04/19 0430  NA 140 137 138 140 141  K 4.1 4.0 3.3* 3.6 3.8  CL 100 102 104 102 98  CO2  --  _0 32  GLUCOSE 113* 141* 116* 95 117*  BUN _1 CREATININE 0.70 0.76 0.59* 0.67 0.75  CALCIUM  --  8.5* 7.5* 8.3* 8.8*  MG  --  1.5*  --   --  1.1*  AST  --   --   --   --  18  ALT  --   --   --   --  16  ALKPHOS  --   --   --   --  52  BILITOT  --   --   --   --  0.7   ------------------------------------------------------------------------------------------------------------------ No results for input(s): CHOL, HDL, LDLCALC, TRIG, CHOLHDL, LDLDIRECT in the last 72 hours.  Lab Results  Component Value Date   HGBA1C 14.9 (H) 08/03/2019   ------------------------------------------------------------------------------------------------------------------ No results for input(s): TSH, T4TOTAL, T3FREE, THYROIDAB in the last 72 hours.  Invalid input(s): FREET3 ------------------------------------------------------------------------------------------------------------------ Recent Labs    09/04/19 0430  FERRITIN 232  TIBC 183*  IRON 39*    Coagulation profile No results for input(s): INR, PROTIME in the last 168 hours.  No results for input(s): DDIMER in the last 72 hours.  Cardiac Enzymes No  results for input(s): CKMB, TROPONINI, MYOGLOBIN in the last 168 hours.  Invalid input(s): CK ------------------------------------------------------------------------------------------------------------------    Component Value Date/Time   BNP 90.9 08/03/2019 0348    Micro Results Recent Results (from the past 240 hour(s))  MRSA PCR Screening     Status: None   Collection Time: 08/29/19 11:59 PM   Specimen: Nasal Mucosa; Nasopharyngeal  Result Value Ref Range Status    MRSA by PCR NEGATIVE NEGATIVE Final    Comment:        The GeneXpert MRSA Assay (FDA approved for NASAL specimens only), is one component of a comprehensive MRSA colonization surveillance program. It is not intended to diagnose MRSA infection nor to guide or monitor treatment for MRSA infections. Performed at [facility], [place] [address], [place], [place] [address]     Radiology Reports MR BRAIN W WO CONTRAST  Result Date: 08/08/2019 CLINICAL DATA:  Encephalopathy EXAM: MRI HEAD WITHOUT AND WITH CONTRAST TECHNIQUE: Multiplanar, multiecho pulse sequences of the brain and surrounding structures were obtained without and with intravenous contrast. CONTRAST:  73m GADAVIST GADOBUTROL 1 MMOL/ML IV SOLN COMPARISON:  CT head 07/10/2007 FINDINGS: Brain: No acute infarction, hemorrhage, hydrocephalus, extra-axial collection or mass lesion. Normal white matter Normal enhancement postcontrast infusion. Vascular: Normal arterial flow voids Skull and upper cervical spine: No focal skeletal lesion. Sinuses/Orbits: Extensive mucosal edema throughout all the paranasal sinuses. Bilateral mastoid effusion. No orbital lesion.  Proptosis bilaterally. Other: None IMPRESSION: Normal MRI of the brain with contrast Extensive sinus mucosal disease.  Bilateral mastoid effusion. Bilateral proptosis. Electronically Signed   By: CFranchot GalloM.D.   On: 08/08/2019 16:54   DG CHEST PORT 1 VIEW  Result Date: 09/04/2019 CLINICAL DATA:  Respiratory distress with tracheostomy tube in place. EXAM: PORTABLE CHEST 1 VIEW COMPARISON:  08/28/2019 FINDINGS: The tracheostomy tube tip is above the carina. Right arm PICC line tip is in the cavoatrial junction. There is a feeding tube with tip below the level of the GE junction. Diminished aeration to the left lung base is again noted which may represent atelectasis or airspace disease. IMPRESSION: 1. Satisfactory position of support apparatus. 2. No change in aeration to the left  lung base. Electronically Signed   By: TKerby MoorsM.D.   On: 09/04/2019 18:43   DG CHEST PORT 1 VIEW  Result Date: 08/28/2019 CLINICAL DATA:  Hypoxia. EXAM: PORTABLE CHEST 1 VIEW COMPARISON:  Single-view of the chest 08/19/2019. FINDINGS: Endotracheal tube and feeding tube remain in place. Left basilar airspace disease seen on the prior examination has improved. The right lung is clear. Heart size is upper normal. No pneumothorax or pleural effusion. IMPRESSION: ET tube and feeding tube remain in good position. Improved left basilar airspace disease. Electronically Signed   By: TInge RiseM.D.   On: 08/28/2019 13:28   DG Chest Port 1 View  Result Date: 08/19/2019 CLINICAL DATA:  59year old male with respiratory failure currently intubated EXAM: PORTABLE CHEST 1 VIEW COMPARISON:  Prior chest x-ray 07/19/2019 FINDINGS: The patient remains intubated. The tip of the endotracheal tube is 3.4 cm above the carina. An enteric feeding tube is present but incompletely imaged. The tube passes below the diaphragm and is presumably within the stomach or proximal small bowel. Slightly in proved inspiratory volumes with decreasing right perihilar and right basilar atelectasis. Persistent dense left basilar opacity which remains nonspecific. Background interstitial prominence and bronchitic changes are similar compared to prior. No pneumothorax. IMPRESSION: 1. Slightly improved inspiratory volumes with decreasing  right perihilar and right basilar atelectasis. 2. Persistent left greater than right bibasilar airspace opacities which may reflect atelectasis and/or infiltrate. 3. Stable and satisfactory support apparatus. Electronically Signed   By: Jacqulynn Cadet M.D.   On: 08/19/2019 10:25   DG CHEST PORT 1 VIEW  Result Date: 08/18/2019 CLINICAL DATA:  Intubation. EXAM: PORTABLE CHEST 1 VIEW COMPARISON:  Chest x-ray 08/16/2019 FINDINGS: Tracheostomy tube was removed. Endotracheal tube terminates 2 cm above  the carina. NG tube courses off the inferior border the film. Heart is enlarged. Bilateral pleural effusions have increased, left greater than right. Bibasilar airspace disease noted. Interstitial edema is present. IMPRESSION: 1. Increasing bilateral pleural effusions, left greater than right. 2. Bibasilar airspace disease likely reflects atelectasis. Infection is not excluded. 3. Cardiomegaly with interstitial edema. Electronically Signed   By: San Morelle M.D.   On: 08/18/2019 04:20   DG CHEST PORT 1 VIEW  Result Date: 08/16/2019 CLINICAL DATA:  Respiratory arrest EXAM: PORTABLE CHEST 1 VIEW COMPARISON:  Three days ago FINDINGS: A tracheostomy tube has been placed. Feeding tube which reaches the stomach. Right PICC with tip at the upper cavoatrial junction. Artifact from support hardware. Significantly improved aeration with better lung volumes and improved edema. Borderline heart size is stable. IMPRESSION: 1. Unremarkable hardware positioning. 2. Symmetric aeration which is improved from 3 days ago. Electronically Signed   By: Monte Fantasia M.D.   On: 08/16/2019 06:37   DG Chest Port 1 View  Result Date: 08/13/2019 CLINICAL DATA:  Acute hypercapnic respiratory failure. EXAM: PORTABLE CHEST 1 VIEW COMPARISON:  08/12/2019 FINDINGS: Feeding tube tip is below the level of the diaphragm. ETT tip is stable above the carina. Low lung volumes. Mild diffuse bilateral hazy lung opacities noted. IMPRESSION: 1. Decreased lung with new mild hazy lung opacities which may reflect portable technique versus mild interstitial edema. 2. Stable support apparatus. . Electronically Signed   By: Kerby Moors M.D.   On: 08/13/2019 08:10   DG CHEST PORT 1 VIEW  Addendum Date: 08/12/2019   ADDENDUM REPORT: 08/12/2019 09:02 ADDENDUM: Interval placement of right-sided PICC line. The distal portions of the PICC line are identified in the projection of the SVC. Cannot confidently identified the distal tip however due  to superimposed right IJ catheter. Electronically Signed   By: Kerby Moors M.D.   On: 08/12/2019 09:02   Result Date: 08/12/2019 CLINICAL DATA:  Cardiac arrest.  Respiratory distress. EXAM: PORTABLE CHEST 1 VIEW COMPARISON:  08/05/2019 FINDINGS: Endotracheal tube tip is identified at the level of the carina and appears directed towards the right mainstem bronchus. Right IJ catheter tip is in the right atrium. Heart size is normal. Decreased lung volumes. Pulmonary vascular congestion. No airspace opacities IMPRESSION: 1. Endotracheal tube tip is identified at the level of the carina and appears directed towards the right mainstem bronchus. 2. Pulmonary vascular congestion. Electronically Signed: By: Kerby Moors M.D. On: 08/12/2019 08:46   DG Abd Portable 1V  Result Date: 08/30/2019 CLINICAL DATA:  Feeding tube placement EXAM: PORTABLE ABDOMEN - 1 VIEW COMPARISON:  08/04/2019 FINDINGS: Nasoenteric feeding tube tip is seen within the gastric antrum. The abdominal gas pattern is nonspecific due to a paucity of intra-abdominal gas. Right flank and pelvis excluded from view. IMPRESSION: Nasoenteric feeding tube tip is within the gastric antrum. Electronically Signed   By: Fidela Salisbury MD   On: 08/30/2019 19:29   DG Swallowing Func-Speech Pathology  Result Date: 09/04/2019 Objective Swallowing Evaluation: Type of Study: MBS-Modified Barium  Swallow Study  Patient Details Name: TOLBERT MATHESON MRN: [id number] Date of Birth: 1960-08-05 Today's Date: 09/04/2019 Time: SLP Start Time (ACUTE ONLY): 1320 -SLP Stop Time (ACUTE ONLY): 1332 SLP Time Calculation (min) (ACUTE ONLY): 12 min Past Medical History: Past Medical History: Diagnosis Date . Asthma  . Chronic respiratory failure with hypoxia ([place]) 07/09/2017 . COPD (chronic obstructive pulmonary disease) ([place])  . Diabetes mellitus without complication ([place])  . Difficult intubation  . Hypertension  . Shortness of breath dyspnea  . Sleep apnea  Past Surgical  History: Past Surgical History: Procedure Laterality Date . IR GASTROSTOMY TUBE MOD SED  07/05/2017 . THYROIDECTOMY Left 08/30/2019  Procedure: THYROIDECTOMY CERVICAL LIPECTOMY;  Surgeon: Izora Gala, MD;  Location: [facility];  Service: ENT;  Laterality: Left;  NEEDS RNFA PLEASE . TRACHEOSTOMY TUBE PLACEMENT N/A 06/17/2017  Procedure: TRACHEOSTOMY;  Surgeon: Leta Baptist, MD;  Location: [facility];  Service: ENT;  Laterality: N/A; . TRACHEOSTOMY TUBE PLACEMENT N/A 08/14/2019  Procedure: TRACHEOSTOMY;  Surgeon: Izora Gala, MD;  Location: [facility];  Service: ENT;  Laterality: N/A; . TRACHEOSTOMY TUBE PLACEMENT N/A 08/30/2019  Procedure: TRACHEOSTOMY  REVISION;  Surgeon: Izora Gala, MD;  Location: [facility];  Service: ENT;  Laterality: N/A; HPI: 59 year old male with baseline chronic respiratory failure on the basis of severe OHS/OSA complicated further by significant obstructive disease in the setting of COPD.  Presents with acute on chronic hypoxic and hypercarbic respiratory failure. Has required mechanical ventilation for most of the hospitalization, extubated on 7/16, had stridor, unsuccessful re-intubation, had emergent tracheostomy which was difficult.  On 7/24 he became agitated and his tracheostomy dislodged, required emergent re-intubation with following failed tracheostomy with cuff leak, now s/p tracheostomy revision with total thyroidectomy and cervical lipectomy.  Pt has a hx of a trach and oropharyngeal dysphagia in 2019 with two FEES completed during that admission.  Subjective: Pt was encountered awake/alert with trach collar Assessment / Plan / Recommendation CHL IP CLINICAL IMPRESSIONS 09/04/2019 Clinical Impression Pt demonstrates adequate airway protection during swallow assessment, only impairment is mild vallecular packing during mastication. PMSV was in place. One instance of flash penetration seen between swallows, likely from oral residue transited to pharynx. Pt is able to resume a regular diet and thin liquids  but must wear PMSV during oral intake. No SLP f/u needed for swallowing.  SLP Visit Diagnosis Dysphagia, unspecified (R13.10) Attention and concentration deficit following -- Frontal lobe and executive function deficit following -- Impact on safety and function Mild aspiration risk   CHL IP TREATMENT RECOMMENDATION 09/04/2019 Treatment Recommendations No treatment recommended at this time   Prognosis 09/02/2019 Prognosis for Safe Diet Advancement Fair Barriers to Reach Goals Severity of deficits Barriers/Prognosis Comment -- CHL IP DIET RECOMMENDATION 09/04/2019 SLP Diet Recommendations Regular solids;Thin liquid Liquid Administration via Cup;Straw Medication Administration Whole meds with liquid Compensations Slow rate;Small sips/bites Postural Changes Seated upright at 90 degrees;Remain semi-upright after after feeds/meals (Comment)   CHL IP OTHER RECOMMENDATIONS 09/04/2019 Recommended Consults -- Oral Care Recommendations -- Other Recommendations Place PMSV during PO intake   CHL IP FOLLOW UP RECOMMENDATIONS 09/02/2019 Follow up Recommendations LTACH;Inpatient Rehab   CHL IP FREQUENCY AND DURATION 09/02/2019 Speech Therapy Frequency (ACUTE ONLY) min 2x/week Treatment Duration 2 weeks      CHL IP ORAL PHASE 09/04/2019 Oral Phase WFL Oral - Pudding Teaspoon -- Oral - Pudding Cup -- Oral - Honey Teaspoon -- Oral - Honey Cup -- Oral - Nectar Teaspoon -- Oral - Nectar Cup -- Oral - Nectar Straw --  Oral - Thin Teaspoon -- Oral - Thin Cup -- Oral - Thin Straw -- Oral - Puree -- Oral - Mech Soft -- Oral - Regular -- Oral - Multi-Consistency -- Oral - Pill -- Oral Phase - Comment --  CHL IP PHARYNGEAL PHASE 09/04/2019 Pharyngeal Phase WFL Pharyngeal- Pudding Teaspoon -- Pharyngeal -- Pharyngeal- Pudding Cup -- Pharyngeal -- Pharyngeal- Honey Teaspoon -- Pharyngeal -- Pharyngeal- Honey Cup -- Pharyngeal -- Pharyngeal- Nectar Teaspoon -- Pharyngeal -- Pharyngeal- Nectar Cup -- Pharyngeal -- Pharyngeal- Nectar Straw -- Pharyngeal --  Pharyngeal- Thin Teaspoon -- Pharyngeal -- Pharyngeal- Thin Cup -- Pharyngeal -- Pharyngeal- Thin Straw -- Pharyngeal -- Pharyngeal- Puree -- Pharyngeal -- Pharyngeal- Mechanical Soft -- Pharyngeal -- Pharyngeal- Regular -- Pharyngeal -- Pharyngeal- Multi-consistency -- Pharyngeal -- Pharyngeal- Pill -- Pharyngeal -- Pharyngeal Comment --  CHL IP CERVICAL ESOPHAGEAL PHASE 09/04/2019 Cervical Esophageal Phase WFL Pudding Teaspoon -- Pudding Cup -- Honey Teaspoon -- Honey Cup -- Nectar Teaspoon -- Nectar Cup -- Nectar Straw -- Thin Teaspoon -- Thin Cup -- Thin Straw -- Puree -- Mechanical Soft -- Regular -- Multi-consistency -- Pill -- Cervical Esophageal Comment -- Herbie Baltimore, MA CCC-SLP Acute Rehabilitation Services Pager [phone] Office [phone] Lynann Beaver 09/04/2019, 1:59 PM              [place] EKG SITE RITE  Result Date: 08/11/2019 If Site Rite image not attached, placement could not be confirmed due to current cardiac rhythm.    Time Spent in minutes  30     Desiree Hane M.D on 09/06/2019 at 9:28 AM  To page go to [email or web address] - password [facility]

## 2019-09-06 NOTE — Significant Event (Signed)
Per nursing staff, patient went in to respiratory distress and subsequently became bradycardic with HR in the 50s. CPR was empirically started briefly however he never lost a pulse. Respiratory therapist arrived at bedside and patient was suctioned with much improvement to his respiratory status. Primary team was notified and he was transferred to [facility]M.

## 2019-09-06 NOTE — Plan of Care (Signed)
  Problem: Education: Goal: Knowledge of General Education information will improve Description: Including pain rating scale, medication(s)/side effects and non-pharmacologic comfort measures Outcome: Progressing   Problem: Health Behavior/Discharge Planning: Goal: Ability to manage health-related needs will improve Outcome: Progressing   Problem: Clinical Measurements: Goal: Ability to maintain clinical measurements within normal limits will improve Outcome: Progressing   Problem: Clinical Measurements: Goal: Will remain free from infection Outcome: Progressing   Problem: Clinical Measurements: Goal: Diagnostic test results will improve Outcome: Progressing   Problem: Clinical Measurements: Goal: Respiratory complications will improve Outcome: Progressing   Problem: Clinical Measurements: Goal: Cardiovascular complication will be avoided Outcome: Progressing   Problem: Nutrition: Goal: Adequate nutrition will be maintained Outcome: Progressing   Problem: Coping: Goal: Level of anxiety will decrease Outcome: Progressing   Problem: Pain Managment: Goal: General experience of comfort will improve Outcome: Progressing   Problem: Safety: Goal: Ability to remain free from injury will improve Outcome: Progressing   Problem: Skin Integrity: Goal: Risk for impaired skin integrity will decrease Outcome: Progressing   Problem: Activity: Goal: Ability to tolerate increased activity will improve Outcome: Progressing   Problem: Respiratory: Goal: Ability to maintain a clear airway and adequate ventilation will improve Outcome: Progressing   Problem: Role Relationship: Goal: Method of communication will improve Outcome: Progressing

## 2019-09-07 ENCOUNTER — Ambulatory Visit: Payer: Medicaid Other | Admitting: Registered"

## 2019-09-07 DIAGNOSIS — I5032 Chronic diastolic (congestive) heart failure: Secondary | ICD-10-CM

## 2019-09-07 LAB — GLUCOSE, CAPILLARY
Glucose-Capillary: 103 mg/dL — ABNORMAL HIGH (ref 70–99)
Glucose-Capillary: 114 mg/dL — ABNORMAL HIGH (ref 70–99)
Glucose-Capillary: 115 mg/dL — ABNORMAL HIGH (ref 70–99)
Glucose-Capillary: 118 mg/dL — ABNORMAL HIGH (ref 70–99)
Glucose-Capillary: 123 mg/dL — ABNORMAL HIGH (ref 70–99)
Glucose-Capillary: 135 mg/dL — ABNORMAL HIGH (ref 70–99)
Glucose-Capillary: 136 mg/dL — ABNORMAL HIGH (ref 70–99)

## 2019-09-07 LAB — MAGNESIUM: Magnesium: 2 mg/dL (ref 1.7–2.4)

## 2019-09-07 LAB — BASIC METABOLIC PANEL
Anion gap: 8 (ref 5–15)
BUN: 20 mg/dL (ref 6–20)
CO2: 34 mmol/L — ABNORMAL HIGH (ref 22–32)
Calcium: 8.9 mg/dL (ref 8.9–10.3)
Chloride: 98 mmol/L (ref 98–111)
Creatinine, Ser: 0.88 mg/dL (ref 0.61–1.24)
GFR calc Af Amer: >60 mL/min (ref >60)
GFR calc non Af Amer: >60 mL/min (ref >60)
Glucose, Bld: 130 mg/dL — ABNORMAL HIGH (ref 70–99)
Potassium: 3.6 mmol/L (ref 3.5–5.1)
Sodium: 140 mmol/L (ref 135–145)

## 2019-09-07 NOTE — Progress Notes (Signed)
[facility]  PROGRESS NOTE  Timothy Le FTD:[id number] DOB: [birth year] DOA: 07/30/2019 PCP: Kerin Perna, NP Admit date - 07/30/2019   Admitting Physician Jacky Kindle, MD  Outpatient Primary MD for the patient is Kerin Perna, NP  LOS - 48 Brief Narrative   Timothy Le is a 59 y.o. year old male with medical history significant for type 2 diabetes, HTN, COPD on home oxygen, morbid obesity, OSA who presented on 07/30/2019 with worsening shortness of breath or being off his CPAP for the past 2 weeks (per family it was broken) was found to have acute on chronic hypoxemic/hypercapnic respiratory failure due to OHS/OSA complicated by large thyroid goiter.  Patient required intubation.  Hospital course complicated by tracheostomy failure requiring total thyroidectomy and cervical lipectomy and trach revision with ENT on 8/11.  Patient was transferred to stepdown unit on the morning of 8/18 due to episode of desaturation and associated bradycardia with heart rate in the 20s presumably due to mucous plugging.  Chest compressions were started at that time per report patient did not lose pulse.  Bronchoscopy was performed by PCCM on arrival to stepdown unit with no evidence of obstructive mucus or debris and clear visualization of the carina  Subjective  No acute events overnight.  States breathing continues to improve, only mild cough. A & P  Acute on chronic hypoxic hypercapnic respiratory failure.  Presumed secondary to obesity hypoventilation syndrome/obstructive sleep apnea as well as further complication due to large thyroid goiter.  Coloscopy 9/18 nonrevealing, suspect mucous plug because of acute hypoxic event on a.m. of 8/18 that resolved with inline suctioning -Close monitoring in progressive unit -Nebulized normal saline, continue trach collar, pulmonary toilet, PCCM will continue to follow-up with weekly  Chronic diastolic CHF, euvolemic on exam -continue torsemide  10 mg daily, -Holding home Cardura, started on Lopressor during hospital stay   HLD, stable -continue Lipitor  OSA/OHS -Continue Mucinex  Peripheral neuropathy -Continue Neurontin, started on hospitalization  Type 2 diabetes, poorly controlled A1c 14.9 fasting blood sugar of 115 -Holding home Metformin -Continue Levemir 30 units twice daily,, monitor CBG, sliding scale as needed  GERD, stable continue Protonix  Depression, stable continue Zoloft, Seroquel  Gout, stable continue allopurinol  Acute metabolic encephalopathy, resolved.  Has had episodes of agitation like related to prolonged hospitalization, hospital-acquired delirium and metabolic dysfunction related to respiratory failure.  MRI brain showed no acute abnormalities.     Family Communication  : None Code Status : Full  Disposition Plan  :  Patient is from home. Anticipated d/c date: 2 to 3 days. Barriers to d/c or necessity for inpatient status:  Close monitoring of respiratory status given patient now back in stepdown unit for close monitoring after recent hypoxic event, continue trach care, PT recommends LTAC  Consults  : PCCM  Procedures  :    DVT Prophylaxis  :  Lovenox  Lab Results  Component Value Date   PLT 151 09/06/2019    Diet :  Diet Order            DIET SOFT Room service appropriate? Yes; Fluid consistency: Thin; Fluid restriction: 1500 mL Fluid  Diet effective now                  Inpatient Medications Scheduled Meds: . allopurinol  100 mg Oral Daily  . atorvastatin  10 mg Oral Daily  . chlorhexidine gluconate (MEDLINE KIT)  15 mL Mouth Rinse BID  . Chlorhexidine Gluconate Cloth  6 each Topical Daily  . dextromethorphan-guaiFENesin  1 tablet Oral BID  . docusate sodium  100 mg Oral BID  . enoxaparin (LOVENOX) injection  0.5 mg/kg Subcutaneous Q24H  . gabapentin  200 mg Oral TID  . insulin aspart  0-20 Units Subcutaneous Q4H  . insulin aspart  5 Units Subcutaneous Q4H  . insulin  detemir  30 Units Subcutaneous Q12H  . mouth rinse  15 mL Mouth Rinse 10 times per day  . melatonin  3 mg Oral QHS  . metoprolol tartrate  12.5 mg Oral BID  . multivitamin with minerals  1 tablet Oral Daily  . nutrition supplement (JUVEN)  1 packet Oral BID BM  . pantoprazole  40 mg Oral QHS  . polyethylene glycol  17 g Per Tube Daily  . Ensure Max Protein  11 oz Oral BID  . QUEtiapine  100 mg Oral BID  . sertraline  100 mg Oral Daily  . sodium chloride flush  10-40 mL Intracatheter Q12H  . sodium chloride HYPERTONIC  4 mL Nebulization BID  . torsemide  10 mg Oral Daily   Continuous Infusions: . sodium chloride    . sodium chloride Stopped (08/30/19 1427)   PRN Meds:.Place/Maintain arterial line **AND** sodium chloride, alum & mag hydroxide-simeth, hydrALAZINE, ipratropium-albuterol, oxyCODONE, sodium chloride flush  Antibiotics  :   Anti-infectives (From admission, onward)   Start     Dose/Rate Route Frequency Ordered Stop   08/13/19 1000  cefTRIAXone (ROCEPHIN) 2 g in sodium chloride 0.9 % 100 mL IVPB  Status:  Discontinued        2 g 200 mL/hr over 30 Minutes Intravenous Every 24 hours 08/13/19 0912 08/15/19 1150   08/12/19 1000  ceFEPIme (MAXIPIME) 2 g in sodium chloride 0.9 % 100 mL IVPB  Status:  Discontinued        2 g 200 mL/hr over 30 Minutes Intravenous Every 8 hours 08/12/19 0939 08/13/19 0912   08/09/19 1700  piperacillin-tazobactam (ZOSYN) IVPB 3.375 g  Status:  Discontinued        3.375 g 12.5 mL/hr over 240 Minutes Intravenous Every 8 hours 08/08/19 1634 08/08/19 1707   08/08/19 1707  piperacillin-tazobactam (ZOSYN) IVPB 3.375 g  Status:  Discontinued        3.375 g 12.5 mL/hr over 240 Minutes Intravenous Every 8 hours 08/08/19 1707 08/09/19 1128   08/04/19 2200  piperacillin-tazobactam (ZOSYN) IVPB 3.375 g  Status:  Discontinued        3.375 g 12.5 mL/hr over 240 Minutes Intravenous Every 8 hours 08/04/19 1413 08/08/19 1634   08/04/19 1300  vancomycin  (VANCOREADY) IVPB 1750 mg/350 mL        1,750 mg 175 mL/hr over 120 Minutes Intravenous  Once 08/04/19 1249 08/04/19 1631   08/01/19 1230  cefTRIAXone (ROCEPHIN) 2 g in sodium chloride 0.9 % 100 mL IVPB  Status:  Discontinued        2 g 200 mL/hr over 30 Minutes Intravenous Every 24 hours 08/01/19 1226 08/04/19 1413   08/01/19 1230  azithromycin (ZITHROMAX) 500 mg in sodium chloride 0.9 % 250 mL IVPB  Status:  Discontinued        500 mg 250 mL/hr over 60 Minutes Intravenous Every 24 hours 08/01/19 1226 08/04/19 1413       Objective   Vitals:   09/07/19 0936 09/07/19 0938 09/07/19 1107 09/07/19 1118  BP:  137/75    Pulse: 77 80 78   Resp: 18 (!) 21 18 (!) 22  Temp:    98.9 F (37.2 C)  TempSrc:    Oral  SpO2:      Weight:      Height:        SpO2: 95 % O2 Flow Rate (L/min): 10 L/min FiO2 (%): 35 %  Wt Readings from Last 3 Encounters:  09/07/19 (!) 148.3 kg  05/24/19 (!) 152 kg  03/03/19 (!) 156.5 kg     Intake/Output Summary (Last 24 hours) at 09/07/2019 1450 Last data filed at 09/07/2019 1244 Gross per 24 hour  Intake 1212.09 ml  Output 2325 ml  Net -1112.91 ml    Physical Exam:     Awake Alert, Oriented X 3, Normal affect No new F.N deficits,  Trach in place with PMV cap on  Normal respiratory effort on 8 L via trach collar, decreased breath sounds, CTAB RRR,No Gallops,Rubs or new Murmurs,  +ve B.Sounds, Abd Soft, No tenderness, No rebound, guarding or rigidity. No Cyanosis, No new Rash or bruise     I have personally reviewed the following:   Data Reviewed:  CBC Recent Labs  Lab 09/01/19 0527 09/04/19 0430 09/06/19 1655  WBC 6.9 4.8 7.4  HGB 8.7* 9.1* 9.1*  HCT 29.1* 29.9* 30.1*  PLT 147* 131* 151  MCV 93.0 91.2 90.1  MCH 27.8 27.7 27.2  MCHC 29.9* 30.4 30.2  RDW 17.5* 16.4* 15.9*  LYMPHSABS  --  1.3 2.0  MONOABS  --  0.3 0.3  EOSABS  --  0.2 0.2  BASOSABS  --  0.0 0.0    Chemistries  Recent Labs  Lab 09/01/19 0527 09/01/19 0527  09/02/19 0450 09/04/19 0430 09/06/19 1400 09/06/19 1835 09/07/19 0344  NA 138  --  140 141 142  --  140  K 3.3*   < > 3.6 3.8 2.5* 3.8 3.6  CL 104  --  102 98 114*  --  98  CO2 28  --  29 32 23  --  34*  GLUCOSE 116*  --  95 117* 86  --  130*  BUN 13  --  _0 --  20  CREATININE 0.59*  --  0.67 0.75 0.46*  --  0.88  CALCIUM 7.5*  --  8.3* 8.8* 6.2*  --  8.9  MG  --   --   --  1.1* 1.0* 1.6* 2.0  AST  --   --   --  18  --   --   --   ALT  --   --   --  16  --   --   --   ALKPHOS  --   --   --  52  --   --   --   BILITOT  --   --   --  0.7  --   --   --    < > = values in this interval not displayed.   ------------------------------------------------------------------------------------------------------------------ No results for input(s): CHOL, HDL, LDLCALC, TRIG, CHOLHDL, LDLDIRECT in the last 72 hours.  Lab Results  Component Value Date   HGBA1C 14.9 (H) 08/03/2019   ------------------------------------------------------------------------------------------------------------------ No results for input(s): TSH, T4TOTAL, T3FREE, THYROIDAB in the last 72 hours.  Invalid input(s): FREET3 ------------------------------------------------------------------------------------------------------------------ No results for input(s): VITAMINB12, FOLATE, FERRITIN, TIBC, IRON, RETICCTPCT in the last 72 hours.  Coagulation profile No results for input(s): INR, PROTIME in the last 168 hours.  No results for input(s): DDIMER in the last 72 hours.  Cardiac Enzymes No results for input(s):  CKMB, TROPONINI, MYOGLOBIN in the last 168 hours.  Invalid input(s): CK ------------------------------------------------------------------------------------------------------------------    Component Value Date/Time   BNP 90.9 08/03/2019 0348    Micro Results Recent Results (from the past 240 hour(s))  MRSA PCR Screening     Status: None   Collection Time: 08/29/19 11:59 PM   Specimen: Nasal  Mucosa; Nasopharyngeal  Result Value Ref Range Status   MRSA by PCR NEGATIVE NEGATIVE Final    Comment:        The GeneXpert MRSA Assay (FDA approved for NASAL specimens only), is one component of a comprehensive MRSA colonization surveillance program. It is not intended to diagnose MRSA infection nor to guide or monitor treatment for MRSA infections. Performed at [facility], [place] [address] [place], [place] [address]     Radiology Reports MR BRAIN W WO CONTRAST  Result Date: 08/08/2019 CLINICAL DATA:  Encephalopathy EXAM: MRI HEAD WITHOUT AND WITH CONTRAST TECHNIQUE: Multiplanar, multiecho pulse sequences of the brain and surrounding structures were obtained without and with intravenous contrast. CONTRAST:  44m GADAVIST GADOBUTROL 1 MMOL/ML IV SOLN COMPARISON:  CT head 07/10/2007 FINDINGS: Brain: No acute infarction, hemorrhage, hydrocephalus, extra-axial collection or mass lesion. Normal white matter Normal enhancement postcontrast infusion. Vascular: Normal arterial flow voids Skull and upper cervical spine: No focal skeletal lesion. Sinuses/Orbits: Extensive mucosal edema throughout all the paranasal sinuses. Bilateral mastoid effusion. No orbital lesion.  Proptosis bilaterally. Other: None IMPRESSION: Normal MRI of the brain with contrast Extensive sinus mucosal disease.  Bilateral mastoid effusion. Bilateral proptosis. Electronically Signed   By: CFranchot GalloM.D.   On: 08/08/2019 16:54   DG CHEST PORT 1 VIEW  Result Date: 09/04/2019 CLINICAL DATA:  Respiratory distress with tracheostomy tube in place. EXAM: PORTABLE CHEST 1 VIEW COMPARISON:  08/28/2019 FINDINGS: The tracheostomy tube tip is above the carina. Right arm PICC line tip is in the cavoatrial junction. There is a feeding tube with tip below the level of the GE junction. Diminished aeration to the left lung base is again noted which may represent atelectasis or airspace disease. IMPRESSION: 1. Satisfactory position  of support apparatus. 2. No change in aeration to the left lung base. Electronically Signed   By: TKerby MoorsM.D.   On: 09/04/2019 18:43   DG CHEST PORT 1 VIEW  Result Date: 08/28/2019 CLINICAL DATA:  Hypoxia. EXAM: PORTABLE CHEST 1 VIEW COMPARISON:  Single-view of the chest 08/19/2019. FINDINGS: Endotracheal tube and feeding tube remain in place. Left basilar airspace disease seen on the prior examination has improved. The right lung is clear. Heart size is upper normal. No pneumothorax or pleural effusion. IMPRESSION: ET tube and feeding tube remain in good position. Improved left basilar airspace disease. Electronically Signed   By: TInge RiseM.D.   On: 08/28/2019 13:28   DG Chest Port 1 View  Result Date: 08/19/2019 CLINICAL DATA:  59year old male with respiratory failure currently intubated EXAM: PORTABLE CHEST 1 VIEW COMPARISON:  Prior chest x-ray 07/19/2019 FINDINGS: The patient remains intubated. The tip of the endotracheal tube is 3.4 cm above the carina. An enteric feeding tube is present but incompletely imaged. The tube passes below the diaphragm and is presumably within the stomach or proximal small bowel. Slightly in proved inspiratory volumes with decreasing right perihilar and right basilar atelectasis. Persistent dense left basilar opacity which remains nonspecific. Background interstitial prominence and bronchitic changes are similar compared to prior. No pneumothorax. IMPRESSION: 1. Slightly improved inspiratory volumes with decreasing right perihilar and  right basilar atelectasis. 2. Persistent left greater than right bibasilar airspace opacities which may reflect atelectasis and/or infiltrate. 3. Stable and satisfactory support apparatus. Electronically Signed   By: Jacqulynn Cadet M.D.   On: 08/19/2019 10:25   DG CHEST PORT 1 VIEW  Result Date: 08/18/2019 CLINICAL DATA:  Intubation. EXAM: PORTABLE CHEST 1 VIEW COMPARISON:  Chest x-ray 08/16/2019 FINDINGS: Tracheostomy  tube was removed. Endotracheal tube terminates 2 cm above the carina. NG tube courses off the inferior border the film. Heart is enlarged. Bilateral pleural effusions have increased, left greater than right. Bibasilar airspace disease noted. Interstitial edema is present. IMPRESSION: 1. Increasing bilateral pleural effusions, left greater than right. 2. Bibasilar airspace disease likely reflects atelectasis. Infection is not excluded. 3. Cardiomegaly with interstitial edema. Electronically Signed   By: San Morelle M.D.   On: 08/18/2019 04:20   DG CHEST PORT 1 VIEW  Result Date: 08/16/2019 CLINICAL DATA:  Respiratory arrest EXAM: PORTABLE CHEST 1 VIEW COMPARISON:  Three days ago FINDINGS: A tracheostomy tube has been placed. Feeding tube which reaches the stomach. Right PICC with tip at the upper cavoatrial junction. Artifact from support hardware. Significantly improved aeration with better lung volumes and improved edema. Borderline heart size is stable. IMPRESSION: 1. Unremarkable hardware positioning. 2. Symmetric aeration which is improved from 3 days ago. Electronically Signed   By: Monte Fantasia M.D.   On: 08/16/2019 06:37   DG Chest Port 1 View  Result Date: 08/13/2019 CLINICAL DATA:  Acute hypercapnic respiratory failure. EXAM: PORTABLE CHEST 1 VIEW COMPARISON:  08/12/2019 FINDINGS: Feeding tube tip is below the level of the diaphragm. ETT tip is stable above the carina. Low lung volumes. Mild diffuse bilateral hazy lung opacities noted. IMPRESSION: 1. Decreased lung with new mild hazy lung opacities which may reflect portable technique versus mild interstitial edema. 2. Stable support apparatus. . Electronically Signed   By: Kerby Moors M.D.   On: 08/13/2019 08:10   DG CHEST PORT 1 VIEW  Addendum Date: 08/12/2019   ADDENDUM REPORT: 08/12/2019 09:02 ADDENDUM: Interval placement of right-sided PICC line. The distal portions of the PICC line are identified in the projection of the  SVC. Cannot confidently identified the distal tip however due to superimposed right IJ catheter. Electronically Signed   By: Kerby Moors M.D.   On: 08/12/2019 09:02   Result Date: 08/12/2019 CLINICAL DATA:  Cardiac arrest.  Respiratory distress. EXAM: PORTABLE CHEST 1 VIEW COMPARISON:  08/05/2019 FINDINGS: Endotracheal tube tip is identified at the level of the carina and appears directed towards the right mainstem bronchus. Right IJ catheter tip is in the right atrium. Heart size is normal. Decreased lung volumes. Pulmonary vascular congestion. No airspace opacities IMPRESSION: 1. Endotracheal tube tip is identified at the level of the carina and appears directed towards the right mainstem bronchus. 2. Pulmonary vascular congestion. Electronically Signed: By: Kerby Moors M.D. On: 08/12/2019 08:46   DG Abd Portable 1V  Result Date: 08/30/2019 CLINICAL DATA:  Feeding tube placement EXAM: PORTABLE ABDOMEN - 1 VIEW COMPARISON:  08/04/2019 FINDINGS: Nasoenteric feeding tube tip is seen within the gastric antrum. The abdominal gas pattern is nonspecific due to a paucity of intra-abdominal gas. Right flank and pelvis excluded from view. IMPRESSION: Nasoenteric feeding tube tip is within the gastric antrum. Electronically Signed   By: Fidela Salisbury MD   On: 08/30/2019 19:29   DG Swallowing Func-Speech Pathology  Result Date: 09/04/2019 Objective Swallowing Evaluation: Type of Study: MBS-Modified Barium Swallow Study  Patient Details Name: SIDHANT HELDERMAN MRN: [id number] Date of Birth: 1960-06-17 Today's Date: 09/04/2019 Time: SLP Start Time (ACUTE ONLY): 1320 -SLP Stop Time (ACUTE ONLY): 1332 SLP Time Calculation (min) (ACUTE ONLY): 12 min Past Medical History: Past Medical History: Diagnosis Date . Asthma  . Chronic respiratory failure with hypoxia ([place]) 07/09/2017 . COPD (chronic obstructive pulmonary disease) ([place])  . Diabetes mellitus without complication ([place])  . Difficult intubation  . Hypertension  .  Shortness of breath dyspnea  . Sleep apnea  Past Surgical History: Past Surgical History: Procedure Laterality Date . IR GASTROSTOMY TUBE MOD SED  07/05/2017 . THYROIDECTOMY Left 08/30/2019  Procedure: THYROIDECTOMY CERVICAL LIPECTOMY;  Surgeon: Izora Gala, MD;  Location: [facility];  Service: ENT;  Laterality: Left;  NEEDS RNFA PLEASE . TRACHEOSTOMY TUBE PLACEMENT N/A 06/17/2017  Procedure: TRACHEOSTOMY;  Surgeon: Leta Baptist, MD;  Location: [facility];  Service: ENT;  Laterality: N/A; . TRACHEOSTOMY TUBE PLACEMENT N/A 08/14/2019  Procedure: TRACHEOSTOMY;  Surgeon: Izora Gala, MD;  Location: [facility];  Service: ENT;  Laterality: N/A; . TRACHEOSTOMY TUBE PLACEMENT N/A 08/30/2019  Procedure: TRACHEOSTOMY  REVISION;  Surgeon: Izora Gala, MD;  Location: [facility];  Service: ENT;  Laterality: N/A; HPI: 59 year old male with baseline chronic respiratory failure on the basis of severe OHS/OSA complicated further by significant obstructive disease in the setting of COPD.  Presents with acute on chronic hypoxic and hypercarbic respiratory failure. Has required mechanical ventilation for most of the hospitalization, extubated on 7/16, had stridor, unsuccessful re-intubation, had emergent tracheostomy which was difficult.  On 7/24 he became agitated and his tracheostomy dislodged, required emergent re-intubation with following failed tracheostomy with cuff leak, now s/p tracheostomy revision with total thyroidectomy and cervical lipectomy.  Pt has a hx of a trach and oropharyngeal dysphagia in 2019 with two FEES completed during that admission.  Subjective: Pt was encountered awake/alert with trach collar Assessment / Plan / Recommendation CHL IP CLINICAL IMPRESSIONS 09/04/2019 Clinical Impression Pt demonstrates adequate airway protection during swallow assessment, only impairment is mild vallecular packing during mastication. PMSV was in place. One instance of flash penetration seen between swallows, likely from oral residue transited to  pharynx. Pt is able to resume a regular diet and thin liquids but must wear PMSV during oral intake. No SLP f/u needed for swallowing.  SLP Visit Diagnosis Dysphagia, unspecified (R13.10) Attention and concentration deficit following -- Frontal lobe and executive function deficit following -- Impact on safety and function Mild aspiration risk   CHL IP TREATMENT RECOMMENDATION 09/04/2019 Treatment Recommendations No treatment recommended at this time   Prognosis 09/02/2019 Prognosis for Safe Diet Advancement Fair Barriers to Reach Goals Severity of deficits Barriers/Prognosis Comment -- CHL IP DIET RECOMMENDATION 09/04/2019 SLP Diet Recommendations Regular solids;Thin liquid Liquid Administration via Cup;Straw Medication Administration Whole meds with liquid Compensations Slow rate;Small sips/bites Postural Changes Seated upright at 90 degrees;Remain semi-upright after after feeds/meals (Comment)   CHL IP OTHER RECOMMENDATIONS 09/04/2019 Recommended Consults -- Oral Care Recommendations -- Other Recommendations Place PMSV during PO intake   CHL IP FOLLOW UP RECOMMENDATIONS 09/02/2019 Follow up Recommendations LTACH;Inpatient Rehab   CHL IP FREQUENCY AND DURATION 09/02/2019 Speech Therapy Frequency (ACUTE ONLY) min 2x/week Treatment Duration 2 weeks      CHL IP ORAL PHASE 09/04/2019 Oral Phase WFL Oral - Pudding Teaspoon -- Oral - Pudding Cup -- Oral - Honey Teaspoon -- Oral - Honey Cup -- Oral - Nectar Teaspoon -- Oral - Nectar Cup -- Oral - Nectar Straw -- Oral -  Thin Teaspoon -- Oral - Thin Cup -- Oral - Thin Straw -- Oral - Puree -- Oral - Mech Soft -- Oral - Regular -- Oral - Multi-Consistency -- Oral - Pill -- Oral Phase - Comment --  CHL IP PHARYNGEAL PHASE 09/04/2019 Pharyngeal Phase WFL Pharyngeal- Pudding Teaspoon -- Pharyngeal -- Pharyngeal- Pudding Cup -- Pharyngeal -- Pharyngeal- Honey Teaspoon -- Pharyngeal -- Pharyngeal- Honey Cup -- Pharyngeal -- Pharyngeal- Nectar Teaspoon -- Pharyngeal -- Pharyngeal- Nectar  Cup -- Pharyngeal -- Pharyngeal- Nectar Straw -- Pharyngeal -- Pharyngeal- Thin Teaspoon -- Pharyngeal -- Pharyngeal- Thin Cup -- Pharyngeal -- Pharyngeal- Thin Straw -- Pharyngeal -- Pharyngeal- Puree -- Pharyngeal -- Pharyngeal- Mechanical Soft -- Pharyngeal -- Pharyngeal- Regular -- Pharyngeal -- Pharyngeal- Multi-consistency -- Pharyngeal -- Pharyngeal- Pill -- Pharyngeal -- Pharyngeal Comment --  CHL IP CERVICAL ESOPHAGEAL PHASE 09/04/2019 Cervical Esophageal Phase WFL Pudding Teaspoon -- Pudding Cup -- Honey Teaspoon -- Honey Cup -- Nectar Teaspoon -- Nectar Cup -- Nectar Straw -- Thin Teaspoon -- Thin Cup -- Thin Straw -- Puree -- Mechanical Soft -- Regular -- Multi-consistency -- Pill -- Cervical Esophageal Comment -- Herbie Baltimore, MA CCC-SLP Acute Rehabilitation Services Pager [phone] Office [phone] Lynann Beaver 09/04/2019, 1:59 PM              [place] EKG SITE RITE  Result Date: 08/11/2019 If Site Rite image not attached, placement could not be confirmed due to current cardiac rhythm.    Time Spent in minutes  30     Desiree Hane M.D on 09/07/2019 at 2:50 PM  To page go to [email or web address] - password [facility]

## 2019-09-07 NOTE — Progress Notes (Signed)
NAME:  Timothy Le, MRN:  [id number], DOB:  August 19, 1960, LOS: 41 ADMISSION DATE:  07/30/2019, CONSULTATION DATE: 09/07/19 REFERRING MD:  Desiree Hane, MD, CHIEF COMPLAINT:  "can't breathe"  Brief History   Patient is a 59 year old African-American male with PMH significant for DM 2, HTN, COPD on home oxygen, morbid obesity, obstructive sleep apnea. Patient presented to the ED on 07/30/2019 with worsening shortness of breath likely in the setting of acute exacerbation of heart failure subsequently intubated for acute on chronic hypoxemic/hypercarbic respiratory failure due to OHS/OSA found to have large thyroid goiter contributing s/p tracheostomy attempts with subsequent tracheostomy failure s/p thyroidectomy and secure trach 08/30/19 with ENT who is transferred to ICU 09/05/19 after acute episode of desaturation and bradycardia due to mucous plug.   History of present illness   Acute respiratory distress. HR dipped to 20s per report. Chest compressions started but never lost pulse per report. RT came to bedside and suctioned thick mucous with quick relief of distress. Came to 90M. Report of resistance at end of trach. Bronch performed that showed patent trach without granuloma or obstructing mucous/debris. Carina visualized and clear.  Past Medical History   Past Medical History:  Diagnosis Date  . Asthma   . Chronic respiratory failure with hypoxia ([place]) 07/09/2017  . COPD (chronic obstructive pulmonary disease) ([place])   . Diabetes mellitus without complication ([place])   . Difficult intubation   . Hypertension   . Shortness of breath dyspnea   . Sleep apnea     Significant Hospital Events   7/16> he was extubated, had stridor, unsuccessful re-intubation, had emergent tracheostomy which was difficult. 7/23> underwent bronchoscopy because of mucous plug. 7/24> tracheostomy dislodged due to severe agitation; required emergent reintubation 7/26> tracheostomy revision with ENT 7/30> ETT  due to cuff leak 8/09> bronch mucus plug resulting in bradycardia and code blue 8/11>total thyroidectomy and cervical lipectomy with suturing of skin flaps to to trachea for tracheostomy 8/14> neck drains removed by ENT.  Transferred out of ICU to hospitalist service on trach collar and Dobbhoff tube 8/16> modified barium swallow -patient showed adequate airway protection.  Switch from Cortrak tube feeding to regular consistency diet. 8/18>> mucous plug, transferred to ICU  Consults:  pccm  Procedures:  PICC Thyroidectomy  Significant Diagnostic Tests:  As per EMR  Micro Data:  As per EMR  Antimicrobials:  n/a  Interim history/subjective:  No further distress from tracheostomy  Objective   Blood pressure 109/79, pulse 82, temperature 98.2 F (36.8 C), temperature source Oral, resp. rate 17, height 6' (1.829 m), weight (!) 148.3 kg, SpO2 95 %.    FiO2 (%):  [28 %-35 %] 35 %   Intake/Output Summary (Last 24 hours) at 09/07/2019 0909 Last data filed at 09/07/2019 7681 Gross per 24 hour  Intake 1672.09 ml  Output 2550 ml  Net -877.91 ml   Filed Weights   09/04/19 0458 09/06/19 0700 09/07/19 0358  Weight: (!) 143.3 kg (!) 145.6 kg (!) 148.3 kg    Examination: General: Obese male no acute distress HEENT: [name] is in place unremarkable Neuro: Grossly intact able to phonate CV: Heart sounds are regular PULM: Diminished in the Feeding tube is in place left nares GI: soft, bsx4 active   Extremities: warm/dry, 1+ edema  Skin: no rashes or lesions    Resolved Hospital Problem list     Assessment & Plan:  Acute on chronic hypercarbic and hypoxemic respiratory failure: Felt to be due to OHS/OSA with more  recent contribution to narrowed airway from large thyroid goiter.  Acute event prompting transfer to ICU on 8/18 due to mucous plug that was resolved with inline suctioning. Continue trach collar Pulmonary toilet Nebulized normal saline Pulmonary critical care will  follow up once with     Chronic diastolic heart failure: Appears euvolemic at this time Continue diuresis and beta-blockers  Diabetes: Sliding scale insulin and long-acting insulin per primary   Best practice:  Diet: per SLP, oral diet Pain/Anxiety/Delirium protocol (if indicated): seroquel VAP protocol (if indicated): chlorhexidine DVT prophylaxis: heparin GI prophylaxis: n/a Glucose control: in range Mobility: OOB to cheir per PT Code Status: full Family Communication: 09/07/2019 patient updated at bedside Disposition: ICU care, if stable transfer out this PM  Labs   CBC: Recent Labs  Lab 09/01/19 0527 09/04/19 0430 09/06/19 1655  WBC 6.9 4.8 7.4  NEUTROABS  --  2.8 4.9  HGB 8.7* 9.1* 9.1*  HCT 29.1* 29.9* 30.1*  MCV 93.0 91.2 90.1  PLT 147* 131* 004    Basic Metabolic Panel: Recent Labs  Lab 09/01/19 0527 09/01/19 0527 09/02/19 0450 09/04/19 0430 09/06/19 1400 09/06/19 1835 09/07/19 0344  NA 138  --  140 141 142  --  140  K 3.3*   < > 3.6 3.8 2.5* 3.8 3.6  CL 104  --  102 98 114*  --  98  CO2 28  --  29 32 23  --  34*  GLUCOSE 116*  --  95 117* 86  --  130*  BUN 13  --  14 19 11   --  20  CREATININE 0.59*  --  0.67 0.75 0.46*  --  0.88  CALCIUM 7.5*  --  8.3* 8.8* 6.2*  --  8.9  MG  --   --   --  1.1* 1.0* 1.6* 2.0  PHOS  --   --   --  3.8  --   --   --    < > = values in this interval not displayed.   GFR: Estimated Creatinine Clearance: 138.7 mL/min (by C-G formula based on SCr of 0.88 mg/dL). Recent Labs  Lab 09/01/19 0527 09/04/19 0430 09/06/19 1655  WBC 6.9 4.8 7.4    Liver Function Tests: Recent Labs  Lab 09/04/19 0430  AST 18  ALT 16  ALKPHOS 52  BILITOT 0.7  PROT 6.7  ALBUMIN 2.6*   No results for input(s): LIPASE, AMYLASE in the last 168 hours. No results for input(s): AMMONIA in the last 168 hours.  ABG    Component Value Date/Time   PHART 7.444 08/24/2019 0514   PCO2ART 50.3 (H) 08/24/2019 0514   PO2ART 123 (H)  08/24/2019 0514   HCO3 34.4 (H) 08/24/2019 0514   TCO2 26 08/30/2019 1533   ACIDBASEDEF 1.0 07/30/2019 1724   O2SAT 99.0 08/24/2019 0514     Coagulation Profile: No results for input(s): INR, PROTIME in the last 168 hours.  Cardiac Enzymes: No results for input(s): CKTOTAL, CKMB, CKMBINDEX, TROPONINI in the last 168 hours.  HbA1C: Hgb A1c MFr Bld  Date/Time Value Ref Range Status  08/03/2019 01:22 PM 14.9 (H) 4.8 - 5.6 % Final    Comment:    (NOTE) Pre diabetes:          5.7%-6.4%  Diabetes:              >6.4%  Glycemic control for   <7.0% adults with diabetes   03/04/2019 07:08 AM 16.6 (H) 4.8 - 5.6 %  Final    Comment:    (NOTE) Pre diabetes:          5.7%-6.4% Diabetes:              >6.4% Glycemic control for   <7.0% adults with diabetes     CBG: Recent Labs  Lab 09/06/19 1505 09/06/19 1904 09/07/19 0033 09/07/19 0432 09/07/19 0805  GLUCAP 113* 125* 114* 123* 103*      Critical care time: [place] ACNP Acute Care Nurse Practitioner [place] Please consult [place] 09/07/2019, 9:10 AM  and re-evaluation of patient's condition.

## 2019-09-07 NOTE — Plan of Care (Signed)
  Problem: Education: Goal: Knowledge of General Education information will improve Description: Including pain rating scale, medication(s)/side effects and non-pharmacologic comfort measures Outcome: Progressing   Problem: Health Behavior/Discharge Planning: Goal: Ability to manage health-related needs will improve Outcome: Progressing   Problem: Clinical Measurements: Goal: Ability to maintain clinical measurements within normal limits will improve Outcome: Progressing   Problem: Clinical Measurements: Goal: Will remain free from infection Outcome: Progressing   Problem: Clinical Measurements: Goal: Diagnostic test results will improve Outcome: Progressing   Problem: Clinical Measurements: Goal: Respiratory complications will improve Outcome: Progressing   Problem: Activity: Goal: Risk for activity intolerance will decrease Outcome: Progressing   Problem: Nutrition: Goal: Adequate nutrition will be maintained Outcome: Progressing   Problem: Elimination: Goal: Will not experience complications related to urinary retention Outcome: Progressing   Problem: Pain Managment: Goal: General experience of comfort will improve Outcome: Progressing   Problem: Safety: Goal: Ability to remain free from injury will improve Outcome: Progressing   Problem: Skin Integrity: Goal: Risk for impaired skin integrity will decrease Outcome: Progressing   Problem: Activity: Goal: Ability to tolerate increased activity will improve Outcome: Progressing   Problem: Respiratory: Goal: Ability to maintain a clear airway and adequate ventilation will improve Outcome: Progressing   Problem: Role Relationship: Goal: Method of communication will improve Outcome: Progressing

## 2019-09-07 NOTE — Progress Notes (Signed)
Physical Therapy Treatment Patient Details Name: NEHEMIAH MCFARREN MRN: [id number] DOB: 1960-03-18 Today's Date: 09/07/2019    History of Present Illness Pt is 59 year old African-American male with PMH significant for DM 2, HTN, COPD on home oxygen, morbid obesity, obstructive sleep apnea. Patient presented to the ED on 07/30/2019 with worsening shortness of breath. Initial work-up suggested pulmonary edema and decompensated diastolic heart failure. He was intubated in the ED and admitted to ICU.  He required mechanical ventilation for most of the hospitalization. On 8/11 he underwent total thyroidectomy and cervical lipectomy with suturing of skin flaps to to trachea for tracheostomy. On 09/05/19 pt tx to ICU due to mucus plug.    PT Comments    Pt making good progress.  He was able to stand today x 3, not able to pivot to chair safely but did perform lateral scoot toward R side.  Demonstrates decreased motor control and coordination of L LE.  Required frequent cues for safety, technique, and sequencing.  Pt mildly impulsive requiring multimodal cues for rest breaks and safety.  All VSS.  Recommend maxi move back to bed with nursing.    Follow Up Recommendations  LTACH;Supervision/Assistance - 24 hour     Equipment Recommendations  Wheelchair cushion (measurements PT);Wheelchair (measurements PT);3in1 (PT);Hospital bed;Rolling walker with 5" wheels (to be further assessed next venue)    Recommendations for Other Services       Precautions / Restrictions Precautions Precautions: Fall Precaution Comments: trach collar and Dobbhoff tube    Mobility  Bed Mobility Overal bed mobility: Needs Assistance Bed Mobility: Supine to Sit     Supine to sit: Min assist     General bed mobility comments: pt able to transfer from supine to sit with use of rail and momentum with cues for sequence  Transfers Overall transfer level: Needs assistance Equipment used: Rolling walker (2  wheeled) Transfers: Sit to/from Stand Sit to Stand: Mod assist;+2 physical assistance;+2 safety/equipment        Lateral/Scoot Transfers: Mod assist;+2 physical assistance;+2 safety/equipment General transfer comment: Pt performed sit to stand x 3 - impulsive requiring cues to take time and for sequencing, pt with poor control of L LE requiring blocking for standing.  Attempted stand pivot but unsafe.  Pt performed lateral scoot to chair toward R side from elevated bed down to chair.  Ambulation/Gait             General Gait Details: not yet able   Stairs             Wheelchair Mobility    Modified Rankin (Stroke Patients Only)       Balance Overall balance assessment: Needs assistance Sitting-balance support: Feet supported;No upper extremity supported Sitting balance-Leahy Scale: Good     Standing balance support: Bilateral upper extremity supported;During functional activity Standing balance-Leahy Scale: Poor Standing balance comment: Able to stand x 3 for ~10-20 sec each time but unsteady requiring at least min A of 2 for steadying                            Cognition Arousal/Alertness: Awake/alert Behavior During Therapy: [facility] for tasks assessed/performed;Impulsive Overall Cognitive Status: Within Functional Limits for tasks assessed                                 General Comments: answering questions appropriately. following commands. mildly impulsive (vs decreased awareness  of deficits).       Exercises      General Comments General comments (skin integrity, edema, etc.): VSS      Pertinent Vitals/Pain Pain Assessment: No/denies pain    Home Living                      Prior Function            PT Goals (current goals can now be found in the care plan section) Acute Rehab PT Goals Patient Stated Goal: walk PT Goal Formulation: With patient Time For Goal Achievement: 09/17/19 Potential to Achieve Goals:  Good Progress towards PT goals: Progressing toward goals    Frequency    Min 3X/week      PT Plan Current plan remains appropriate    Co-evaluation              AM-PAC PT "6 Clicks" Mobility   Outcome Measure  Help needed turning from your back to your side while in a flat bed without using bedrails?: A Little Help needed moving from lying on your back to sitting on the side of a flat bed without using bedrails?: A Little Help needed moving to and from a bed to a chair (including a wheelchair)?: A Lot Help needed standing up from a chair using your arms (e.g., wheelchair or bedside chair)?: A Lot Help needed to walk in hospital room?: Total Help needed climbing 3-5 steps with a railing? : Total 6 Click Score: 12    End of Session Equipment Utilized During Treatment: Gait belt Activity Tolerance: Patient tolerated treatment well;Other (comment) (lunch arrived limited further tx) Patient left: in chair;with call bell/phone within reach;with chair alarm set Nurse Communication: Mobility status;Need for lift equipment PT Visit Diagnosis: Unsteadiness on feet (R26.81);Other abnormalities of gait and mobility (R26.89)     Time: 1130-1150 PT Time Calculation (min) (ACUTE ONLY): 20 min  Charges:  $Therapeutic Activity: 8-22 mins                     Abran Richard, PT Acute Rehab Services Pager [phone] [facility] [phone]     Karlton Lemon 09/07/2019, 12:45 PM

## 2019-09-07 NOTE — Plan of Care (Signed)
  Problem: Health Behavior/Discharge Planning: Goal: Ability to manage health-related needs will improve Outcome: Progressing   Problem: Clinical Measurements: Goal: Ability to maintain clinical measurements within normal limits will improve Outcome: Progressing Goal: Will remain free from infection Outcome: Progressing Goal: Diagnostic test results will improve Outcome: Progressing Goal: Respiratory complications will improve Outcome: Progressing Goal: Cardiovascular complication will be avoided Outcome: Progressing   Problem: Activity: Goal: Risk for activity intolerance will decrease Outcome: Progressing   Problem: Nutrition: Goal: Adequate nutrition will be maintained Outcome: Progressing   Problem: Coping: Goal: Level of anxiety will decrease Outcome: Progressing   Problem: Elimination: Goal: Will not experience complications related to bowel motility Outcome: Progressing Goal: Will not experience complications related to urinary retention Outcome: Progressing   Problem: Pain Managment: Goal: General experience of comfort will improve Outcome: Progressing   Problem: Safety: Goal: Ability to remain free from injury will improve Outcome: Progressing   Problem: Skin Integrity: Goal: Risk for impaired skin integrity will decrease Outcome: Progressing   Problem: Activity: Goal: Ability to tolerate increased activity will improve Outcome: Progressing   Problem: Respiratory: Goal: Ability to maintain a clear airway and adequate ventilation will improve Outcome: Progressing   Problem: Role Relationship: Goal: Method of communication will improve Outcome: Progressing   Problem: Activity: Goal: Ability to tolerate increased activity will improve Outcome: Progressing   Problem: Respiratory: Goal: Ability to maintain a clear airway and adequate ventilation will improve Outcome: Progressing   Problem: Role Relationship: Goal: Method of communication will  improve Outcome: Progressing

## 2019-09-07 NOTE — Progress Notes (Signed)
  Speech Language Pathology Treatment: Nada Boozer Speaking valve  Patient Details Name: Timothy Le MRN: [id number] DOB: 12-31-1960 Today's Date: 09/07/2019 Time: 7711-6579 SLP Time Calculation (min) (ACUTE ONLY): 19 min  Assessment / Plan / Recommendation Clinical Impression  Pt was seen for PMSV treatment. He was alert and coopertive throughout the session. Pt's PMSV was on upon SLP's entry and pt indicated that he had been wearing it all day without difficulty. Pt reported that he has now been using the PMSV during meals as recommended and he stated that swallowing feels easier with it in place. He tolerated PMSV for the length of the entire session with vitals range RR 19-23, SpO2 93-100, and HR 84-86 during this period and pt denied any respiratory difficulty. Vocal quality was WNL and breath support was improved. He participated in conversation with the SLP with minimal need for clarification. Pt was able to independently demonstrate donning and doffing of the PMSV and it was left on at the end of the session. PMSV may be used during all waking hours. SLP will see pt once more; however, it is unlikely that further skilled SLP services will be needed beyond that point.    HPI HPI: 59 year old male with baseline chronic respiratory failure on the basis of severe OHS/OSA complicated further by significant obstructive disease in the setting of COPD.  Presents with acute on chronic hypoxic and hypercarbic respiratory failure. Has required mechanical ventilation for most of the hospitalization, extubated on 7/16, had stridor, unsuccessful re-intubation, had emergent tracheostomy which was difficult.  On 7/24 he became agitated and his tracheostomy dislodged, required emergent re-intubation with following failed tracheostomy with cuff leak, now s/p tracheostomy revision with total thyroidectomy and cervical lipectomy.  Pt has a hx of a trach and oropharyngeal dysphagia in 2019 with two FEES completed  during that admission.       SLP Plan  Continue with current plan of care       Recommendations  Diet recommendations: Dysphagia 3 (mechanical soft);Thin liquid Liquids provided via: Cup;Straw Medication Administration: Whole meds with liquid Supervision: Patient able to self feed Compensations: Slow rate;Small sips/bites Postural Changes and/or Swallow Maneuvers: Seated upright 90 degrees      Patient may use Passy-Muir Speech Valve: During all waking hours (remove during sleep) PMSV Supervision: Intermittent MD: Please consider changing trach tube to : Smaller size;Cuffless (when appropriate)         Oral Care Recommendations: Oral care BID;Staff/trained caregiver to provide oral care Follow up Recommendations: LTACH;Inpatient Rehab SLP Visit Diagnosis: Aphonia (R49.1) Plan: Continue with current plan of care       [place] I. Hardin Negus, [place], [place] Office number [phone] Pager [place] 09/07/2019, 5:20 PM

## 2019-09-08 LAB — CBC
HCT: 30.7 % — ABNORMAL LOW (ref 39.0–52.0)
Hemoglobin: 9.3 g/dL — ABNORMAL LOW (ref 13.0–17.0)
MCH: 27.4 pg (ref 26.0–34.0)
MCHC: 30.3 g/dL (ref 30.0–36.0)
MCV: 90.6 fL (ref 80.0–100.0)
Platelets: 188 10*3/uL (ref 150–400)
RBC: 3.39 MIL/uL — ABNORMAL LOW (ref 4.22–5.81)
RDW: 15.9 % — ABNORMAL HIGH (ref 11.5–15.5)
WBC: 5.2 10*3/uL (ref 4.0–10.5)
nRBC: 0 % (ref 0.0–0.2)

## 2019-09-08 LAB — GLUCOSE, CAPILLARY
Glucose-Capillary: 118 mg/dL — ABNORMAL HIGH (ref 70–99)
Glucose-Capillary: 142 mg/dL — ABNORMAL HIGH (ref 70–99)
Glucose-Capillary: 154 mg/dL — ABNORMAL HIGH (ref 70–99)
Glucose-Capillary: 114 mg/dL — ABNORMAL HIGH (ref 70–99)
Glucose-Capillary: 133 mg/dL — ABNORMAL HIGH (ref 70–99)
Glucose-Capillary: 109 mg/dL — ABNORMAL HIGH (ref 70–99)

## 2019-09-08 LAB — BASIC METABOLIC PANEL
Anion gap: 9 (ref 5–15)
BUN: 25 mg/dL — ABNORMAL HIGH (ref 6–20)
CO2: 33 mmol/L — ABNORMAL HIGH (ref 22–32)
Calcium: 8.9 mg/dL (ref 8.9–10.3)
Chloride: 99 mmol/L (ref 98–111)
Creatinine, Ser: 0.94 mg/dL (ref 0.61–1.24)
GFR calc Af Amer: >60 mL/min (ref >60)
GFR calc non Af Amer: >60 mL/min (ref >60)
Glucose, Bld: 188 mg/dL — ABNORMAL HIGH (ref 70–99)
Potassium: 4 mmol/L (ref 3.5–5.1)
Sodium: 141 mmol/L (ref 135–145)

## 2019-09-08 NOTE — Plan of Care (Signed)
  Problem: Education: Goal: Knowledge of General Education information will improve Description: Including pain rating scale, medication(s)/side effects and non-pharmacologic comfort measures Outcome: Progressing   Problem: Health Behavior/Discharge Planning: Goal: Ability to manage health-related needs will improve Outcome: Progressing   Problem: Clinical Measurements: Goal: Ability to maintain clinical measurements within normal limits will improve Outcome: Progressing   Problem: Clinical Measurements: Goal: Will remain free from infection Outcome: Progressing   Problem: Clinical Measurements: Goal: Diagnostic test results will improve Outcome: Progressing   Problem: Clinical Measurements: Goal: Respiratory complications will improve Outcome: Progressing   Problem: Activity: Goal: Risk for activity intolerance will decrease Outcome: Progressing   Problem: Nutrition: Goal: Adequate nutrition will be maintained Outcome: Progressing   Problem: Pain Managment: Goal: General experience of comfort will improve Outcome: Progressing   Problem: Safety: Goal: Ability to remain free from injury will improve Outcome: Progressing   Problem: Skin Integrity: Goal: Risk for impaired skin integrity will decrease Outcome: Progressing   Problem: Respiratory: Goal: Ability to maintain a clear airway and adequate ventilation will improve Outcome: Progressing   Problem: Role Relationship: Goal: Method of communication will improve Outcome: Progressing   Problem: Activity: Goal: Ability to tolerate increased activity will improve Outcome: Progressing

## 2019-09-08 NOTE — Progress Notes (Signed)
[facility]  PROGRESS NOTE  Timothy Le TKW:[id number] DOB: 23-Nov-1960 DOA: 07/30/2019 PCP: Kerin Perna, NP Admit date - 07/30/2019   Admitting Physician Jacky Kindle, MD  Outpatient Primary MD for the patient is Kerin Perna, NP  LOS - 29 Brief Narrative   Timothy Le is a 59 y.o. year old male with medical history significant for type 2 diabetes, HTN, COPD on home oxygen, morbid obesity, OSA, history of tracheal stenosis and prior tracheostomy who presented on 07/30/2019 with worsening shortness of breath or being off his CPAP for the past 2 weeks (per family it was broken) was found to have acute on chronic hypoxemic/hypercapnic respiratory failure due to OHS/OSA complicated by large thyroid goiter.  Patient required intubation upon presentation to ED due to minimally responsive, with marked hypoxia and presumptive hypercarbia.  Initial CO2 on arrival 64, pH 7.25 with diffuse pulmonary edema on chest x-ray.     Hospital course complicated by failed extubation related to acute respiratory distress failed attempts of reintubation requiring revision colostomy on 7/16.  7/24 tracheostomy became dislodged in the setting of agitation required emergent reintubation followed by tracheostomy revision with ENT on 7/26.  8/9 old mucous plug resulting in bradycardia and CODE BLUE.  Total thyroidectomy and cervical lipectomy and trach revision with ENT on 8/11.  Patient was transferred to progressive care unit under hospital service on 8/15.  8/18 transfer back to stepdown unit due to desaturation in the setting of mucous plugging and significant bradycardia and attempted chest compressions (but no actual loss of pulse)   Bronchoscopy was performed by PCCM on arrival to stepdown unit on 8/18 with no evidence of obstructive mucus or debris and clear visualization of the carina.  Patient has been monitoring progressive unit since then.  Subjective  No acute events overnight.  States  breathing continues to improve, only mild cough. A & P  Acute on chronic hypoxic hypercapnic respiratory failure.  Presumed secondary to obesity hypoventilation syndrome/obstructive sleep apnea as well as further complication due to large thyroid goiter.  Bronchoscopy on 8/18 shows clear airways, patient continues to have recurrent episodes of mucous plugging and near cardiac events.  No respiratory issues since 8/18 -Close monitoring in progressive unit -Nebulized normal saline, continue trach collar, pulmonary toilet, -PCCM will continue to follow-up with weekly, not a candidate for downsizing currently  Chronic diastolic CHF, euvolemic on exam -continue torsemide 10 mg daily, -Holding home Cardura, started on Lopressor during hospital stay   HLD, stable -continue Lipitor  OSA/OHS -Continue Mucinex -Monitor on trach support  Peripheral neuropathy -Continue Neurontin, started on hospitalization  Type 2 diabetes, poorly controlled A1c 14.9 fasting blood sugar of 115 -Holding home Metformin -Continue Levemir 30 units twice daily,, monitor CBG, sliding scale as needed  GERD, stable continue Protonix  Depression, stable continue Zoloft, Seroquel  Gout, stable continue allopurinol  Acute metabolic encephalopathy, resolved.  Has had episodes of agitation like related to prolonged hospitalization, hospital-acquired delirium and metabolic dysfunction related to respiratory failure.  MRI brain showed no acute abnormalities.     Family Communication  : None Code Status : Full  Disposition Plan  :  Patient is from home. Anticipated d/c date: 2 to 3 days. Barriers to d/c or necessity for inpatient status:  Close monitoring of respiratory status given very tenuous with recurrent episodes of bradycardia/new cardiac events in the setting of mucous plugging related to trach, continue trach care, PT recommends LTAC  Consults  : PCCM  Procedures  :  Thyroidectomies cervical lipectomy,  tracheostomy revision 8/11  DVT Prophylaxis  :  Lovenox  Lab Results  Component Value Date   PLT 188 09/08/2019    Diet :  Diet Order            DIET SOFT Room service appropriate? Yes; Fluid consistency: Thin; Fluid restriction: 1500 mL Fluid  Diet effective now                  Inpatient Medications Scheduled Meds: . allopurinol  100 mg Oral Daily  . atorvastatin  10 mg Oral Daily  . chlorhexidine gluconate (MEDLINE KIT)  15 mL Mouth Rinse BID  . Chlorhexidine Gluconate Cloth  6 each Topical Daily  . dextromethorphan-guaiFENesin  1 tablet Oral BID  . docusate sodium  100 mg Oral BID  . enoxaparin (LOVENOX) injection  0.5 mg/kg Subcutaneous Q24H  . gabapentin  200 mg Oral TID  . insulin aspart  0-20 Units Subcutaneous Q4H  . insulin aspart  5 Units Subcutaneous Q4H  . insulin detemir  30 Units Subcutaneous Q12H  . mouth rinse  15 mL Mouth Rinse 10 times per day  . melatonin  3 mg Oral QHS  . metoprolol tartrate  12.5 mg Oral BID  . multivitamin with minerals  1 tablet Oral Daily  . nutrition supplement (JUVEN)  1 packet Oral BID BM  . pantoprazole  40 mg Oral QHS  . polyethylene glycol  17 g Per Tube Daily  . Ensure Max Protein  11 oz Oral BID  . QUEtiapine  100 mg Oral BID  . sertraline  100 mg Oral Daily  . sodium chloride flush  10-40 mL Intracatheter Q12H  . sodium chloride HYPERTONIC  4 mL Nebulization BID  . torsemide  10 mg Oral Daily   Continuous Infusions: . sodium chloride    . sodium chloride Stopped (08/30/19 1427)   PRN Meds:.Place/Maintain arterial line **AND** sodium chloride, alum & mag hydroxide-simeth, hydrALAZINE, ipratropium-albuterol, oxyCODONE, sodium chloride flush  Antibiotics  :   Anti-infectives (From admission, onward)   Start     Dose/Rate Route Frequency Ordered Stop   08/13/19 1000  cefTRIAXone (ROCEPHIN) 2 g in sodium chloride 0.9 % 100 mL IVPB  Status:  Discontinued        2 g 200 mL/hr over 30 Minutes Intravenous Every 24 hours  08/13/19 0912 08/15/19 1150   08/12/19 1000  ceFEPIme (MAXIPIME) 2 g in sodium chloride 0.9 % 100 mL IVPB  Status:  Discontinued        2 g 200 mL/hr over 30 Minutes Intravenous Every 8 hours 08/12/19 0939 08/13/19 0912   08/09/19 1700  piperacillin-tazobactam (ZOSYN) IVPB 3.375 g  Status:  Discontinued        3.375 g 12.5 mL/hr over 240 Minutes Intravenous Every 8 hours 08/08/19 1634 08/08/19 1707   08/08/19 1707  piperacillin-tazobactam (ZOSYN) IVPB 3.375 g  Status:  Discontinued        3.375 g 12.5 mL/hr over 240 Minutes Intravenous Every 8 hours 08/08/19 1707 08/09/19 1128   08/04/19 2200  piperacillin-tazobactam (ZOSYN) IVPB 3.375 g  Status:  Discontinued        3.375 g 12.5 mL/hr over 240 Minutes Intravenous Every 8 hours 08/04/19 1413 08/08/19 1634   08/04/19 1300  vancomycin (VANCOREADY) IVPB 1750 mg/350 mL        1,750 mg 175 mL/hr over 120 Minutes Intravenous  Once 08/04/19 1249 08/04/19 1631   08/01/19 1230  cefTRIAXone (ROCEPHIN) 2 g in  sodium chloride 0.9 % 100 mL IVPB  Status:  Discontinued        2 g 200 mL/hr over 30 Minutes Intravenous Every 24 hours 08/01/19 1226 08/04/19 1413   08/01/19 1230  azithromycin (ZITHROMAX) 500 mg in sodium chloride 0.9 % 250 mL IVPB  Status:  Discontinued        500 mg 250 mL/hr over 60 Minutes Intravenous Every 24 hours 08/01/19 1226 08/04/19 1413       Objective   Vitals:   09/08/19 0819 09/08/19 0820 09/08/19 1057 09/08/19 1141  BP:   111/67   Pulse:  73  90  Resp:  20  (!) 23  Temp:   98.5 F (36.9 C)   TempSrc:   Oral   SpO2: 98% 99%  92%  Weight:      Height:        SpO2: 92 % O2 Flow Rate (L/min): (S) 5 L/min FiO2 (%): (S) 28 %  Wt Readings from Last 3 Encounters:  09/07/19 (!) 148.3 kg  05/24/19 (!) 152 kg  03/03/19 (!) 156.5 kg     Intake/Output Summary (Last 24 hours) at 09/08/2019 1404 Last data filed at 09/08/2019 1255 Gross per 24 hour  Intake 1120 ml  Output 1775 ml  Net -655 ml    Physical  Exam:     Awake Alert, Oriented X 3, Normal affect No new F.N deficits,  Trach in place with PMV cap on  Normal respiratory effort on 8 L via trach collar, decreased breath sounds, CTAB RRR,No Gallops,Rubs or new Murmurs,  +ve B.Sounds, Abd Soft, No tenderness, No rebound, guarding or rigidity. No Cyanosis, No new Rash or bruise     I have personally reviewed the following:   Data Reviewed:  CBC Recent Labs  Lab 09/04/19 0430 09/06/19 1655 09/08/19 0840  WBC 4.8 7.4 5.2  HGB 9.1* 9.1* 9.3*  HCT 29.9* 30.1* 30.7*  PLT 131* 151 188  MCV 91.2 90.1 90.6  MCH 27.7 27.2 27.4  MCHC 30.4 30.2 30.3  RDW 16.4* 15.9* 15.9*  LYMPHSABS 1.3 2.0  --   MONOABS 0.3 0.3  --   EOSABS 0.2 0.2  --   BASOSABS 0.0 0.0  --     Chemistries  Recent Labs  Lab 09/02/19 0450 09/02/19 0450 09/04/19 0430 09/06/19 1400 09/06/19 1835 09/07/19 0344 09/08/19 0840  NA 140  --  141 142  --  140 141  K 3.6   < > 3.8 2.5* 3.8 3.6 4.0  CL 102  --  98 114*  --  98 99  CO2 29  --  32 23  --  34* 33*  GLUCOSE 95  --  117* 86  --  130* 188*  BUN 14  --  19 11  --  20 25*  CREATININE 0.67  --  0.75 0.46*  --  0.88 0.94  CALCIUM 8.3*  --  8.8* 6.2*  --  8.9 8.9  MG  --   --  1.1* 1.0* 1.6* 2.0  --   AST  --   --  18  --   --   --   --   ALT  --   --  16  --   --   --   --   ALKPHOS  --   --  52  --   --   --   --   BILITOT  --   --  0.7  --   --   --   --    < > =  values in this interval not displayed.   ------------------------------------------------------------------------------------------------------------------ No results for input(s): CHOL, HDL, LDLCALC, TRIG, CHOLHDL, LDLDIRECT in the last 72 hours.  Lab Results  Component Value Date   HGBA1C 14.9 (H) 08/03/2019   ------------------------------------------------------------------------------------------------------------------ No results for input(s): TSH, T4TOTAL, T3FREE, THYROIDAB in the last 72 hours.  Invalid input(s):  FREET3 ------------------------------------------------------------------------------------------------------------------ No results for input(s): VITAMINB12, FOLATE, FERRITIN, TIBC, IRON, RETICCTPCT in the last 72 hours.  Coagulation profile No results for input(s): INR, PROTIME in the last 168 hours.  No results for input(s): DDIMER in the last 72 hours.  Cardiac Enzymes No results for input(s): CKMB, TROPONINI, MYOGLOBIN in the last 168 hours.  Invalid input(s): CK ------------------------------------------------------------------------------------------------------------------    Component Value Date/Time   BNP 90.9 08/03/2019 0348    Micro Results Recent Results (from the past 240 hour(s))  MRSA PCR Screening     Status: None   Collection Time: 08/29/19 11:59 PM   Specimen: Nasal Mucosa; Nasopharyngeal  Result Value Ref Range Status   MRSA by PCR NEGATIVE NEGATIVE Final    Comment:        The GeneXpert MRSA Assay (FDA approved for NASAL specimens only), is one component of a comprehensive MRSA colonization surveillance program. It is not intended to diagnose MRSA infection nor to guide or monitor treatment for MRSA infections. Performed at [facility], [place] [address], [place], [place] [address]     Radiology Reports DG CHEST PORT 1 VIEW  Result Date: 09/04/2019 CLINICAL DATA:  Respiratory distress with tracheostomy tube in place. EXAM: PORTABLE CHEST 1 VIEW COMPARISON:  08/28/2019 FINDINGS: The tracheostomy tube tip is above the carina. Right arm PICC line tip is in the cavoatrial junction. There is a feeding tube with tip below the level of the GE junction. Diminished aeration to the left lung base is again noted which may represent atelectasis or airspace disease. IMPRESSION: 1. Satisfactory position of support apparatus. 2. No change in aeration to the left lung base. Electronically Signed   By: Kerby Moors M.D.   On: 09/04/2019 18:43   DG CHEST PORT 1  VIEW  Result Date: 08/28/2019 CLINICAL DATA:  Hypoxia. EXAM: PORTABLE CHEST 1 VIEW COMPARISON:  Single-view of the chest 08/19/2019. FINDINGS: Endotracheal tube and feeding tube remain in place. Left basilar airspace disease seen on the prior examination has improved. The right lung is clear. Heart size is upper normal. No pneumothorax or pleural effusion. IMPRESSION: ET tube and feeding tube remain in good position. Improved left basilar airspace disease. Electronically Signed   By: Inge Rise M.D.   On: 08/28/2019 13:28   DG Chest Port 1 View  Result Date: 08/19/2019 CLINICAL DATA:  59 year old male with respiratory failure currently intubated EXAM: PORTABLE CHEST 1 VIEW COMPARISON:  Prior chest x-ray 07/19/2019 FINDINGS: The patient remains intubated. The tip of the endotracheal tube is 3.4 cm above the carina. An enteric feeding tube is present but incompletely imaged. The tube passes below the diaphragm and is presumably within the stomach or proximal small bowel. Slightly in proved inspiratory volumes with decreasing right perihilar and right basilar atelectasis. Persistent dense left basilar opacity which remains nonspecific. Background interstitial prominence and bronchitic changes are similar compared to prior. No pneumothorax. IMPRESSION: 1. Slightly improved inspiratory volumes with decreasing right perihilar and right basilar atelectasis. 2. Persistent left greater than right bibasilar airspace opacities which may reflect atelectasis and/or infiltrate. 3. Stable and satisfactory support apparatus. Electronically Signed   By: Dellis Filbert.D.  On: 08/19/2019 10:25   DG CHEST PORT 1 VIEW  Result Date: 08/18/2019 CLINICAL DATA:  Intubation. EXAM: PORTABLE CHEST 1 VIEW COMPARISON:  Chest x-ray 08/16/2019 FINDINGS: Tracheostomy tube was removed. Endotracheal tube terminates 2 cm above the carina. NG tube courses off the inferior border the film. Heart is enlarged. Bilateral pleural  effusions have increased, left greater than right. Bibasilar airspace disease noted. Interstitial edema is present. IMPRESSION: 1. Increasing bilateral pleural effusions, left greater than right. 2. Bibasilar airspace disease likely reflects atelectasis. Infection is not excluded. 3. Cardiomegaly with interstitial edema. Electronically Signed   By: San Morelle M.D.   On: 08/18/2019 04:20   DG CHEST PORT 1 VIEW  Result Date: 08/16/2019 CLINICAL DATA:  Respiratory arrest EXAM: PORTABLE CHEST 1 VIEW COMPARISON:  Three days ago FINDINGS: A tracheostomy tube has been placed. Feeding tube which reaches the stomach. Right PICC with tip at the upper cavoatrial junction. Artifact from support hardware. Significantly improved aeration with better lung volumes and improved edema. Borderline heart size is stable. IMPRESSION: 1. Unremarkable hardware positioning. 2. Symmetric aeration which is improved from 3 days ago. Electronically Signed   By: Monte Fantasia M.D.   On: 08/16/2019 06:37   DG Chest Port 1 View  Result Date: 08/13/2019 CLINICAL DATA:  Acute hypercapnic respiratory failure. EXAM: PORTABLE CHEST 1 VIEW COMPARISON:  08/12/2019 FINDINGS: Feeding tube tip is below the level of the diaphragm. ETT tip is stable above the carina. Low lung volumes. Mild diffuse bilateral hazy lung opacities noted. IMPRESSION: 1. Decreased lung with new mild hazy lung opacities which may reflect portable technique versus mild interstitial edema. 2. Stable support apparatus. . Electronically Signed   By: Kerby Moors M.D.   On: 08/13/2019 08:10   DG CHEST PORT 1 VIEW  Addendum Date: 08/12/2019   ADDENDUM REPORT: 08/12/2019 09:02 ADDENDUM: Interval placement of right-sided PICC line. The distal portions of the PICC line are identified in the projection of the SVC. Cannot confidently identified the distal tip however due to superimposed right IJ catheter. Electronically Signed   By: Kerby Moors M.D.   On:  08/12/2019 09:02   Result Date: 08/12/2019 CLINICAL DATA:  Cardiac arrest.  Respiratory distress. EXAM: PORTABLE CHEST 1 VIEW COMPARISON:  08/05/2019 FINDINGS: Endotracheal tube tip is identified at the level of the carina and appears directed towards the right mainstem bronchus. Right IJ catheter tip is in the right atrium. Heart size is normal. Decreased lung volumes. Pulmonary vascular congestion. No airspace opacities IMPRESSION: 1. Endotracheal tube tip is identified at the level of the carina and appears directed towards the right mainstem bronchus. 2. Pulmonary vascular congestion. Electronically Signed: By: Kerby Moors M.D. On: 08/12/2019 08:46   DG Abd Portable 1V  Result Date: 08/30/2019 CLINICAL DATA:  Feeding tube placement EXAM: PORTABLE ABDOMEN - 1 VIEW COMPARISON:  08/04/2019 FINDINGS: Nasoenteric feeding tube tip is seen within the gastric antrum. The abdominal gas pattern is nonspecific due to a paucity of intra-abdominal gas. Right flank and pelvis excluded from view. IMPRESSION: Nasoenteric feeding tube tip is within the gastric antrum. Electronically Signed   By: Fidela Salisbury MD   On: 08/30/2019 19:29   DG Swallowing Func-Speech Pathology  Result Date: 09/04/2019 Objective Swallowing Evaluation: Type of Study: MBS-Modified Barium Swallow Study  Patient Details Name: KURT HOFFMEIER MRN: [id number] Date of Birth: [birth year] Today's Date: 09/04/2019 Time: SLP Start Time (ACUTE ONLY): 1320 -SLP Stop Time (ACUTE ONLY): 1332 SLP Time Calculation (min) (ACUTE ONLY): 12  min Past Medical History: Past Medical History: Diagnosis Date . Asthma  . Chronic respiratory failure with hypoxia ([place]) 07/09/2017 . COPD (chronic obstructive pulmonary disease) ([place])  . Diabetes mellitus without complication ([place])  . Difficult intubation  . Hypertension  . Shortness of breath dyspnea  . Sleep apnea  Past Surgical History: Past Surgical History: Procedure Laterality Date . IR GASTROSTOMY TUBE MOD SED   07/05/2017 . THYROIDECTOMY Left 08/30/2019  Procedure: THYROIDECTOMY CERVICAL LIPECTOMY;  Surgeon: Izora Gala, MD;  Location: [facility];  Service: ENT;  Laterality: Left;  NEEDS RNFA PLEASE . TRACHEOSTOMY TUBE PLACEMENT N/A 06/17/2017  Procedure: TRACHEOSTOMY;  Surgeon: Leta Baptist, MD;  Location: [facility];  Service: ENT;  Laterality: N/A; . TRACHEOSTOMY TUBE PLACEMENT N/A 08/14/2019  Procedure: TRACHEOSTOMY;  Surgeon: Izora Gala, MD;  Location: [facility];  Service: ENT;  Laterality: N/A; . TRACHEOSTOMY TUBE PLACEMENT N/A 08/30/2019  Procedure: TRACHEOSTOMY  REVISION;  Surgeon: Izora Gala, MD;  Location: [facility];  Service: ENT;  Laterality: N/A; HPI: 59 year old male with baseline chronic respiratory failure on the basis of severe OHS/OSA complicated further by significant obstructive disease in the setting of COPD.  Presents with acute on chronic hypoxic and hypercarbic respiratory failure. Has required mechanical ventilation for most of the hospitalization, extubated on 7/16, had stridor, unsuccessful re-intubation, had emergent tracheostomy which was difficult.  On 7/24 he became agitated and his tracheostomy dislodged, required emergent re-intubation with following failed tracheostomy with cuff leak, now s/p tracheostomy revision with total thyroidectomy and cervical lipectomy.  Pt has a hx of a trach and oropharyngeal dysphagia in 2019 with two FEES completed during that admission.  Subjective: Pt was encountered awake/alert with trach collar Assessment / Plan / Recommendation CHL IP CLINICAL IMPRESSIONS 09/04/2019 Clinical Impression Pt demonstrates adequate airway protection during swallow assessment, only impairment is mild vallecular packing during mastication. PMSV was in place. One instance of flash penetration seen between swallows, likely from oral residue transited to pharynx. Pt is able to resume a regular diet and thin liquids but must wear PMSV during oral intake. No SLP f/u needed for swallowing.  SLP Visit  Diagnosis Dysphagia, unspecified (R13.10) Attention and concentration deficit following -- Frontal lobe and executive function deficit following -- Impact on safety and function Mild aspiration risk   CHL IP TREATMENT RECOMMENDATION 09/04/2019 Treatment Recommendations No treatment recommended at this time   Prognosis 09/02/2019 Prognosis for Safe Diet Advancement Fair Barriers to Reach Goals Severity of deficits Barriers/Prognosis Comment -- CHL IP DIET RECOMMENDATION 09/04/2019 SLP Diet Recommendations Regular solids;Thin liquid Liquid Administration via Cup;Straw Medication Administration Whole meds with liquid Compensations Slow rate;Small sips/bites Postural Changes Seated upright at 90 degrees;Remain semi-upright after after feeds/meals (Comment)   CHL IP OTHER RECOMMENDATIONS 09/04/2019 Recommended Consults -- Oral Care Recommendations -- Other Recommendations Place PMSV during PO intake   CHL IP FOLLOW UP RECOMMENDATIONS 09/02/2019 Follow up Recommendations LTACH;Inpatient Rehab   CHL IP FREQUENCY AND DURATION 09/02/2019 Speech Therapy Frequency (ACUTE ONLY) min 2x/week Treatment Duration 2 weeks      CHL IP ORAL PHASE 09/04/2019 Oral Phase WFL Oral - Pudding Teaspoon -- Oral - Pudding Cup -- Oral - Honey Teaspoon -- Oral - Honey Cup -- Oral - Nectar Teaspoon -- Oral - Nectar Cup -- Oral - Nectar Straw -- Oral - Thin Teaspoon -- Oral - Thin Cup -- Oral - Thin Straw -- Oral - Puree -- Oral - Mech Soft -- Oral - Regular -- Oral - Multi-Consistency -- Oral - Pill -- Oral  Phase - Comment --  CHL IP PHARYNGEAL PHASE 09/04/2019 Pharyngeal Phase WFL Pharyngeal- Pudding Teaspoon -- Pharyngeal -- Pharyngeal- Pudding Cup -- Pharyngeal -- Pharyngeal- Honey Teaspoon -- Pharyngeal -- Pharyngeal- Honey Cup -- Pharyngeal -- Pharyngeal- Nectar Teaspoon -- Pharyngeal -- Pharyngeal- Nectar Cup -- Pharyngeal -- Pharyngeal- Nectar Straw -- Pharyngeal -- Pharyngeal- Thin Teaspoon -- Pharyngeal -- Pharyngeal- Thin Cup -- Pharyngeal --  Pharyngeal- Thin Straw -- Pharyngeal -- Pharyngeal- Puree -- Pharyngeal -- Pharyngeal- Mechanical Soft -- Pharyngeal -- Pharyngeal- Regular -- Pharyngeal -- Pharyngeal- Multi-consistency -- Pharyngeal -- Pharyngeal- Pill -- Pharyngeal -- Pharyngeal Comment --  CHL IP CERVICAL ESOPHAGEAL PHASE 09/04/2019 Cervical Esophageal Phase WFL Pudding Teaspoon -- Pudding Cup -- Honey Teaspoon -- Honey Cup -- Nectar Teaspoon -- Nectar Cup -- Nectar Straw -- Thin Teaspoon -- Thin Cup -- Thin Straw -- Puree -- Mechanical Soft -- Regular -- Multi-consistency -- Pill -- Cervical Esophageal Comment -- Herbie Baltimore, MA CCC-SLP Acute Rehabilitation Services Pager [phone] Office [phone] Lynann Beaver 09/04/2019, 1:59 PM              [place] EKG SITE RITE  Result Date: 08/11/2019 If Site Rite image not attached, placement could not be confirmed due to current cardiac rhythm.    Time Spent in minutes  30     Desiree Hane M.D on 09/08/2019 at 2:04 PM  To page go to [email or web address] - password [facility]

## 2019-09-08 NOTE — Plan of Care (Signed)
  Problem: Education: Goal: Knowledge of General Education information will improve Description: Including pain rating scale, medication(s)/side effects and non-pharmacologic comfort measures 09/08/2019 0744 by Shanon Ace, RN Outcome: Progressing 09/07/2019 1759 by Shanon Ace, RN Outcome: Progressing   Problem: Health Behavior/Discharge Planning: Goal: Ability to manage health-related needs will improve 09/08/2019 0744 by Shanon Ace, RN Outcome: Progressing 09/07/2019 1759 by Shanon Ace, RN Outcome: Progressing   Problem: Clinical Measurements: Goal: Ability to maintain clinical measurements within normal limits will improve 09/08/2019 0744 by Shanon Ace, RN Outcome: Progressing 09/07/2019 1759 by Shanon Ace, RN Outcome: Progressing Goal: Will remain free from infection 09/08/2019 0744 by Shanon Ace, RN Outcome: Progressing 09/07/2019 1759 by Shanon Ace, RN Outcome: Progressing Goal: Diagnostic test results will improve 09/08/2019 0744 by Shanon Ace, RN Outcome: Progressing 09/07/2019 1759 by Shanon Ace, RN Outcome: Progressing Goal: Respiratory complications will improve 09/08/2019 0744 by Shanon Ace, RN Outcome: Progressing 09/07/2019 1759 by Shanon Ace, RN Outcome: Progressing Goal: Cardiovascular complication will be avoided 09/08/2019 0744 by Shanon Ace, RN Outcome: Progressing 09/07/2019 1759 by Shanon Ace, RN Outcome: Progressing   Problem: Activity: Goal: Risk for activity intolerance will decrease 09/08/2019 0744 by Shanon Ace, RN Outcome: Progressing 09/07/2019 1759 by Shanon Ace, RN Outcome: Progressing   Problem: Nutrition: Goal: Adequate nutrition will be maintained 09/08/2019 0744 by Shanon Ace, RN Outcome: Progressing 09/07/2019 1759 by Shanon Ace, RN Outcome: Progressing   Problem: Coping: Goal: Level of anxiety will decrease 09/08/2019 0744 by Shanon Ace, RN Outcome: Progressing 09/07/2019 1759 by Shanon Ace, RN Outcome: Progressing   Problem: Elimination: Goal: Will not experience complications related to bowel motility 09/08/2019 0744 by Shanon Ace, RN Outcome: Progressing 09/07/2019 1759 by Shanon Ace, RN Outcome: Progressing Goal: Will not experience complications related to urinary retention 09/08/2019 0744 by Shanon Ace, RN Outcome: Progressing 09/07/2019 1759 by Shanon Ace, RN Outcome: Progressing   Problem: Pain Managment: Goal: General experience of comfort will improve 09/08/2019 0744 by Shanon Ace, RN Outcome: Progressing 09/07/2019 1759 by Shanon Ace, RN Outcome: Progressing   Problem: Safety: Goal: Ability to remain free from injury will improve 09/08/2019 0744 by Shanon Ace, RN Outcome: Progressing 09/07/2019 1759 by Shanon Ace, RN Outcome: Progressing   Problem: Skin Integrity: Goal: Risk for impaired skin integrity will decrease 09/08/2019 0744 by Shanon Ace, RN Outcome: Progressing 09/07/2019 1759 by Shanon Ace, RN Outcome: Progressing   Problem: Activity: Goal: Ability to tolerate increased activity will improve 09/08/2019 0744 by Shanon Ace, RN Outcome: Progressing 09/07/2019 1759 by Shanon Ace, RN Outcome: Progressing   Problem: Respiratory: Goal: Ability to maintain a clear airway and adequate ventilation will improve 09/08/2019 0744 by Shanon Ace, RN Outcome: Progressing 09/07/2019 1759 by Shanon Ace, RN Outcome: Progressing   Problem: Role Relationship: Goal: Method of communication will improve 09/08/2019 0744 by Shanon Ace, RN Outcome: Progressing 09/07/2019 1759 by Shanon Ace, RN Outcome: Progressing   Problem: Activity: Goal: Ability to tolerate increased activity will improve 09/08/2019 0744 by Shanon Ace, RN Outcome: Progressing 09/07/2019 1759 by Shanon Ace, RN Outcome: Progressing   Problem: Respiratory: Goal: Ability to maintain a clear airway and adequate ventilation will  improve 09/08/2019 0744 by Shanon Ace, RN Outcome: Progressing 09/07/2019 1759 by Shanon Ace, RN Outcome: Progressing   Problem: Role Relationship: Goal: Method of communication will improve 09/08/2019 0744 by Shanon Ace, RN Outcome: Progressing 09/07/2019 1759 by Shanon Ace, RN Outcome: Progressing

## 2019-09-08 NOTE — Progress Notes (Signed)
Occupational Therapy Treatment Patient Details Name: Timothy Le MRN: [id number] DOB: 09-12-1960 Today's Date: 09/08/2019    History of present illness Pt is 59 year old African-American male with PMH significant for DM 2, HTN, COPD on home oxygen, morbid obesity, obstructive sleep apnea. Patient presented to the ED on 07/30/2019 with worsening shortness of breath. Initial work-up suggested pulmonary edema and decompensated diastolic heart failure. He was intubated in the ED and admitted to ICU.  He required mechanical ventilation for most of the hospitalization. On 8/11 he underwent total thyroidectomy and cervical lipectomy with suturing of skin flaps to to trachea for tracheostomy. On 09/05/19 pt tx to ICU due to mucus plug.   OT comments  This 59 yo male seen today to work on bed mobility, lateral transfers, and LBD issues. Pt did well with lateral scoot from bed to drop arm recliner (Mod A), he can cross one leg over the other for socks, but not the other (need to introduce AE). He will continue to benefit from acute OT with follow up at [facility].  Follow Up Recommendations  LTACH;Supervision/Assistance - 24 hour    Equipment Recommendations  Other (comment) (TBD next venue)       Precautions / Restrictions Precautions Precautions: Fall Precaution Comments: trach collar and Dobbhoff tube Restrictions Weight Bearing Restrictions: No       Mobility Bed Mobility Overal bed mobility: Needs Assistance Bed Mobility: Supine to Sit     Supine to sit: Min guard;HOB elevated (because he is on an air mattress and even when deflated it can be a little iffy due to sides do not totally deflate)        Transfers Overall transfer level: Needs assistance   Transfers: Lateral/Scoot Transfers          Lateral/Scoot Transfers: Mod assist (level surfaces (bed to drop arm recliner))      Balance Overall balance assessment: Needs assistance Sitting-balance support: No upper extremity  supported;Feet supported Sitting balance-Leahy Scale: Good                                     ADL either performed or assessed with clinical judgement   ADL Overall ADL's : Needs assistance/impaired               Lower Body Bathing Details (indicate cue type and reason): Normally gets socks on sitting on EOB and turns sideways, he can cross RLE over left do to this but cannot due left over right                             Vision Patient Visual Report: No change from baseline            Cognition Arousal/Alertness: Awake/alert Behavior During Therapy: WFL for tasks assessed/performed Overall Cognitive Status: Within Functional Limits for tasks assessed                                                     Pertinent Vitals/ Pain       Pain Assessment: No/denies pain         Frequency  Min 3X/week        Progress Toward Goals  OT Goals(current goals can now be found  in the care plan section)  Progress towards OT goals: Progressing toward goals     Plan Discharge plan remains appropriate       AM-PAC OT "6 Clicks" Daily Activity     Outcome Measure   Help from another person eating meals?: None Help from another person taking care of personal grooming?: A Little Help from another person toileting, which includes using toliet, bedpan, or urinal?: Total Help from another person bathing (including washing, rinsing, drying)?: A Lot Help from another person to put on and taking off regular upper body clothing?: A Little Help from another person to put on and taking off regular lower body clothing?: Total 6 Click Score: 14    End of Session Equipment Utilized During Treatment: Oxygen;Gait belt;Other (comment) (trach collar, dobbhoff tube, PMV)  OT Visit Diagnosis: Unsteadiness on feet (R26.81);Other abnormalities of gait and mobility (R26.89);Muscle weakness (generalized) (M62.81)   Activity Tolerance Patient  tolerated treatment well   Patient Left in chair;with call bell/phone within reach   Nurse Communication Mobility status (lateral scoot)        Time: 3825-0539 OT Time Calculation (min): 26 min  Charges: OT General Charges $OT Visit: 1 Visit OT Treatments $Self Care/Home Management : 23-37 mins  [place] [address], OTR/L Acute [employer] Pager [phone] Office [phone]      Almon Register 09/08/2019, 3:23 PM

## 2019-09-08 NOTE — Consult Note (Signed)
[place] Nurse wound follow up Patient receiving care in [place]. Wound type: MASD to gluteal fold and coccyx have resolved. Measurement: Wound bed: Drainage (amount, consistency, odor)  Periwound: Dressing procedure/placement/frequency: I converted the existing Aquacel and foam dressing to a PRN order in case the area begins to experience moisture issues again.

## 2019-09-09 LAB — GLUCOSE, CAPILLARY
Glucose-Capillary: 147 mg/dL — ABNORMAL HIGH (ref 70–99)
Glucose-Capillary: 107 mg/dL — ABNORMAL HIGH (ref 70–99)
Glucose-Capillary: 130 mg/dL — ABNORMAL HIGH (ref 70–99)
Glucose-Capillary: 126 mg/dL — ABNORMAL HIGH (ref 70–99)
Glucose-Capillary: 143 mg/dL — ABNORMAL HIGH (ref 70–99)
Glucose-Capillary: 114 mg/dL — ABNORMAL HIGH (ref 70–99)

## 2019-09-09 NOTE — Progress Notes (Signed)
[facility]  PROGRESS NOTE  AEDON DEASON MGQ:[id number] DOB: 1960/02/10 DOA: 07/30/2019 PCP: Kerin Perna, NP Admit date - 07/30/2019   Admitting Physician Jacky Kindle, MD  Outpatient Primary MD for the patient is Kerin Perna, NP  LOS - 19 Brief Narrative   Timothy Le is a 59 y.o. year old male with medical history significant for type 2 diabetes, HTN, COPD on home oxygen, morbid obesity, OSA, history of tracheal stenosis and prior tracheostomy who presented on 07/30/2019 with worsening shortness of breath or being off his CPAP for the past 2 weeks (per family it was broken) was found to have acute on chronic hypoxemic/hypercapnic respiratory failure due to OHS/OSA complicated by large thyroid goiter.  Patient required intubation upon presentation to ED due to minimally responsive, with marked hypoxia and presumptive hypercarbia.  Initial CO2 on arrival 64, pH 7.25 with diffuse pulmonary edema on chest x-ray.     Hospital course complicated by failed extubation related to acute respiratory distress failed attempts of reintubation requiring revision colostomy on 7/16.  7/24 tracheostomy became dislodged in the setting of agitation required emergent reintubation followed by tracheostomy revision with ENT on 7/26.  8/9 old mucous plug resulting in bradycardia and CODE BLUE.  Total thyroidectomy and cervical lipectomy and trach revision with ENT on 8/11.  Patient was transferred to progressive care unit under hospital service on 8/15.  8/18 transfer back to stepdown unit due to desaturation in the setting of mucous plugging and significant bradycardia and attempted chest compressions (but no actual loss of pulse)   Bronchoscopy was performed by PCCM on arrival to stepdown unit on 8/18 with no evidence of obstructive mucus or debris and clear visualization of the carina.  Patient has been monitoring progressive unit since then.  Subjective  No acute events overnight.  States  breathing continues to improve, only mild cough. A & P  Acute on chronic hypoxic hypercapnic respiratory failure.  Presumed secondary to obesity hypoventilation syndrome/obstructive sleep apnea as well as further complication due to large thyroid goiter.  Bronchoscopy on 8/18 shows clear airways, patient continues to have recurrent episodes of mucous plugging and near cardiac events.  No respiratory issues since 8/18.  Currently on trach collar with 5 L -Close monitoring in progressive unit -Nebulized 3%saline BID per PCCM recs, continue trach collar, pulmonary toilet, -PCCM will continue to follow-up with weekly, not a candidate for downsizing currently -Schedule Mucinex, as needed DuoNebs  Chronic diastolic CHF, euvolemic on exam -continue torsemide 10 mg daily, -Holding home Cardura, started on Lopressor during hospital stay   HLD, stable -continue Lipitor  OSA/OHS -Continue Mucinex -Monitor on trach support  Peripheral neuropathy -Continue Neurontin, started on hospitalization  Type 2 diabetes, poorly controlled A1c 14.9 fasting blood sugar of 115 -Holding home Metformin -Continue Levemir 30 units twice daily,, monitor CBG, sliding scale as needed  GERD, stable continue Protonix  Depression, stable continue Zoloft, Seroquel  Gout, stable continue allopurinol  Acute metabolic encephalopathy, resolved.  Has had episodes of agitation like related to prolonged hospitalization, hospital-acquired delirium and metabolic dysfunction related to respiratory failure.  MRI brain showed no acute abnormalities.     Family Communication  : None Code Status : Full  Disposition Plan  :  Patient is from home. Anticipated d/c date: 2 to 3 days. Barriers to d/c or necessity for inpatient status:  Close monitoring of respiratory status given very tenuous with recurrent episodes of bradycardia/new cardiac events in the setting of mucous plugging  related to trach, continue trach care, PT  recommends LTAC  Consults  : PCCM  Procedures  :    Thyroidectomies cervical lipectomy, tracheostomy revision 8/11  DVT Prophylaxis  :  Lovenox  Lab Results  Component Value Date   PLT 188 09/08/2019    Diet :  Diet Order            DIET SOFT Room service appropriate? Yes; Fluid consistency: Thin; Fluid restriction: 1500 mL Fluid  Diet effective now                  Inpatient Medications Scheduled Meds: . allopurinol  100 mg Oral Daily  . atorvastatin  10 mg Oral Daily  . chlorhexidine gluconate (MEDLINE KIT)  15 mL Mouth Rinse BID  . Chlorhexidine Gluconate Cloth  6 each Topical Daily  . dextromethorphan-guaiFENesin  1 tablet Oral BID  . docusate sodium  100 mg Oral BID  . enoxaparin (LOVENOX) injection  0.5 mg/kg Subcutaneous Q24H  . gabapentin  200 mg Oral TID  . insulin aspart  0-20 Units Subcutaneous Q4H  . insulin aspart  5 Units Subcutaneous Q4H  . insulin detemir  30 Units Subcutaneous Q12H  . mouth rinse  15 mL Mouth Rinse 10 times per day  . melatonin  3 mg Oral QHS  . metoprolol tartrate  12.5 mg Oral BID  . multivitamin with minerals  1 tablet Oral Daily  . nutrition supplement (JUVEN)  1 packet Oral BID BM  . pantoprazole  40 mg Oral QHS  . polyethylene glycol  17 g Per Tube Daily  . Ensure Max Protein  11 oz Oral BID  . QUEtiapine  100 mg Oral BID  . sertraline  100 mg Oral Daily  . sodium chloride flush  10-40 mL Intracatheter Q12H  . sodium chloride HYPERTONIC  4 mL Nebulization BID  . torsemide  10 mg Oral Daily   Continuous Infusions: . sodium chloride    . sodium chloride Stopped (08/30/19 1427)   PRN Meds:.Place/Maintain arterial line **AND** sodium chloride, alum & mag hydroxide-simeth, hydrALAZINE, ipratropium-albuterol, oxyCODONE, sodium chloride flush  Antibiotics  :   Anti-infectives (From admission, onward)   Start     Dose/Rate Route Frequency Ordered Stop   08/13/19 1000  cefTRIAXone (ROCEPHIN) 2 g in sodium chloride 0.9 % 100 mL  IVPB  Status:  Discontinued        2 g 200 mL/hr over 30 Minutes Intravenous Every 24 hours 08/13/19 0912 08/15/19 1150   08/12/19 1000  ceFEPIme (MAXIPIME) 2 g in sodium chloride 0.9 % 100 mL IVPB  Status:  Discontinued        2 g 200 mL/hr over 30 Minutes Intravenous Every 8 hours 08/12/19 0939 08/13/19 0912   08/09/19 1700  piperacillin-tazobactam (ZOSYN) IVPB 3.375 g  Status:  Discontinued        3.375 g 12.5 mL/hr over 240 Minutes Intravenous Every 8 hours 08/08/19 1634 08/08/19 1707   08/08/19 1707  piperacillin-tazobactam (ZOSYN) IVPB 3.375 g  Status:  Discontinued        3.375 g 12.5 mL/hr over 240 Minutes Intravenous Every 8 hours 08/08/19 1707 08/09/19 1128   08/04/19 2200  piperacillin-tazobactam (ZOSYN) IVPB 3.375 g  Status:  Discontinued        3.375 g 12.5 mL/hr over 240 Minutes Intravenous Every 8 hours 08/04/19 1413 08/08/19 1634   08/04/19 1300  vancomycin (VANCOREADY) IVPB 1750 mg/350 mL        1,750 mg 175  mL/hr over 120 Minutes Intravenous  Once 08/04/19 1249 08/04/19 1631   08/01/19 1230  cefTRIAXone (ROCEPHIN) 2 g in sodium chloride 0.9 % 100 mL IVPB  Status:  Discontinued        2 g 200 mL/hr over 30 Minutes Intravenous Every 24 hours 08/01/19 1226 08/04/19 1413   08/01/19 1230  azithromycin (ZITHROMAX) 500 mg in sodium chloride 0.9 % 250 mL IVPB  Status:  Discontinued        500 mg 250 mL/hr over 60 Minutes Intravenous Every 24 hours 08/01/19 1226 08/04/19 1413       Objective   Vitals:   09/09/19 0737 09/09/19 0751 09/09/19 1146 09/09/19 1153  BP:  100/71  114/60  Pulse: 77 86 86 83  Resp: 15 18 (!) 21 18  Temp:  98.2 F (36.8 C)  97.8 F (36.6 C)  TempSrc:  Axillary  Oral  SpO2: 99% 94% 95% 97%  Weight:      Height:        SpO2: 97 % O2 Flow Rate (L/min): 5 L/min FiO2 (%): 28 %  Wt Readings from Last 3 Encounters:  09/09/19 (!) 147 kg  05/24/19 (!) 152 kg  03/03/19 (!) 156.5 kg     Intake/Output Summary (Last 24 hours) at 09/09/2019  1259 Last data filed at 09/09/2019 0845 Gross per 24 hour  Intake 500 ml  Output 2150 ml  Net -1650 ml    Physical Exam:     Awake Alert, Oriented X 3, Normal affect No new F.N deficits,  Trach in place with PMV cap on  Normal respiratory effort on 5 L via trach collar, decreased breath sounds, CTAB RRR,No Gallops,Rubs or new Murmurs,  +ve B.Sounds, Abd Soft, No tenderness, No rebound, guarding or rigidity. No Cyanosis, No new Rash or bruise   Dry skin   I have personally reviewed the following:   Data Reviewed:  CBC Recent Labs  Lab 09/04/19 0430 09/06/19 1655 09/08/19 0840  WBC 4.8 7.4 5.2  HGB 9.1* 9.1* 9.3*  HCT 29.9* 30.1* 30.7*  PLT 131* 151 188  MCV 91.2 90.1 90.6  MCH 27.7 27.2 27.4  MCHC 30.4 30.2 30.3  RDW 16.4* 15.9* 15.9*  LYMPHSABS 1.3 2.0  --   MONOABS 0.3 0.3  --   EOSABS 0.2 0.2  --   BASOSABS 0.0 0.0  --     Chemistries  Recent Labs  Lab 09/04/19 0430 09/06/19 1400 09/06/19 1835 09/07/19 0344 09/08/19 0840  NA 141 142  --  140 141  K 3.8 2.5* 3.8 3.6 4.0  CL 98 114*  --  98 99  CO2 32 23  --  34* 33*  GLUCOSE 117* 86  --  130* 188*  BUN 19 11  --  20 25*  CREATININE 0.75 0.46*  --  0.88 0.94  CALCIUM 8.8* 6.2*  --  8.9 8.9  MG 1.1* 1.0* 1.6* 2.0  --   AST 18  --   --   --   --   ALT 16  --   --   --   --   ALKPHOS 52  --   --   --   --   BILITOT 0.7  --   --   --   --    ------------------------------------------------------------------------------------------------------------------ No results for input(s): CHOL, HDL, LDLCALC, TRIG, CHOLHDL, LDLDIRECT in the last 72 hours.  Lab Results  Component Value Date   HGBA1C 14.9 (H) 08/03/2019   ------------------------------------------------------------------------------------------------------------------ No  results for input(s): TSH, T4TOTAL, T3FREE, THYROIDAB in the last 72 hours.  Invalid input(s):  FREET3 ------------------------------------------------------------------------------------------------------------------ No results for input(s): VITAMINB12, FOLATE, FERRITIN, TIBC, IRON, RETICCTPCT in the last 72 hours.  Coagulation profile No results for input(s): INR, PROTIME in the last 168 hours.  No results for input(s): DDIMER in the last 72 hours.  Cardiac Enzymes No results for input(s): CKMB, TROPONINI, MYOGLOBIN in the last 168 hours.  Invalid input(s): CK ------------------------------------------------------------------------------------------------------------------    Component Value Date/Time   BNP 90.9 08/03/2019 0348    Micro Results No results found for this or any previous visit (from the past 240 hour(s)).  Radiology Reports DG CHEST PORT 1 VIEW  Result Date: 09/04/2019 CLINICAL DATA:  Respiratory distress with tracheostomy tube in place. EXAM: PORTABLE CHEST 1 VIEW COMPARISON:  08/28/2019 FINDINGS: The tracheostomy tube tip is above the carina. Right arm PICC line tip is in the cavoatrial junction. There is a feeding tube with tip below the level of the GE junction. Diminished aeration to the left lung base is again noted which may represent atelectasis or airspace disease. IMPRESSION: 1. Satisfactory position of support apparatus. 2. No change in aeration to the left lung base. Electronically Signed   By: Kerby Moors M.D.   On: 09/04/2019 18:43   DG CHEST PORT 1 VIEW  Result Date: 08/28/2019 CLINICAL DATA:  Hypoxia. EXAM: PORTABLE CHEST 1 VIEW COMPARISON:  Single-view of the chest 08/19/2019. FINDINGS: Endotracheal tube and feeding tube remain in place. Left basilar airspace disease seen on the prior examination has improved. The right lung is clear. Heart size is upper normal. No pneumothorax or pleural effusion. IMPRESSION: ET tube and feeding tube remain in good position. Improved left basilar airspace disease. Electronically Signed   By: Inge Rise  M.D.   On: 08/28/2019 13:28   DG Chest Port 1 View  Result Date: 08/19/2019 CLINICAL DATA:  59 year old male with respiratory failure currently intubated EXAM: PORTABLE CHEST 1 VIEW COMPARISON:  Prior chest x-ray 07/19/2019 FINDINGS: The patient remains intubated. The tip of the endotracheal tube is 3.4 cm above the carina. An enteric feeding tube is present but incompletely imaged. The tube passes below the diaphragm and is presumably within the stomach or proximal small bowel. Slightly in proved inspiratory volumes with decreasing right perihilar and right basilar atelectasis. Persistent dense left basilar opacity which remains nonspecific. Background interstitial prominence and bronchitic changes are similar compared to prior. No pneumothorax. IMPRESSION: 1. Slightly improved inspiratory volumes with decreasing right perihilar and right basilar atelectasis. 2. Persistent left greater than right bibasilar airspace opacities which may reflect atelectasis and/or infiltrate. 3. Stable and satisfactory support apparatus. Electronically Signed   By: Jacqulynn Cadet M.D.   On: 08/19/2019 10:25   DG CHEST PORT 1 VIEW  Result Date: 08/18/2019 CLINICAL DATA:  Intubation. EXAM: PORTABLE CHEST 1 VIEW COMPARISON:  Chest x-ray 08/16/2019 FINDINGS: Tracheostomy tube was removed. Endotracheal tube terminates 2 cm above the carina. NG tube courses off the inferior border the film. Heart is enlarged. Bilateral pleural effusions have increased, left greater than right. Bibasilar airspace disease noted. Interstitial edema is present. IMPRESSION: 1. Increasing bilateral pleural effusions, left greater than right. 2. Bibasilar airspace disease likely reflects atelectasis. Infection is not excluded. 3. Cardiomegaly with interstitial edema. Electronically Signed   By: San Morelle M.D.   On: 08/18/2019 04:20   DG CHEST PORT 1 VIEW  Result Date: 08/16/2019 CLINICAL DATA:  Respiratory arrest EXAM: PORTABLE CHEST 1  VIEW COMPARISON:  Three days ago FINDINGS: A tracheostomy tube has been placed. Feeding tube which reaches the stomach. Right PICC with tip at the upper cavoatrial junction. Artifact from support hardware. Significantly improved aeration with better lung volumes and improved edema. Borderline heart size is stable. IMPRESSION: 1. Unremarkable hardware positioning. 2. Symmetric aeration which is improved from 3 days ago. Electronically Signed   By: Monte Fantasia M.D.   On: 08/16/2019 06:37   DG Chest Port 1 View  Result Date: 08/13/2019 CLINICAL DATA:  Acute hypercapnic respiratory failure. EXAM: PORTABLE CHEST 1 VIEW COMPARISON:  08/12/2019 FINDINGS: Feeding tube tip is below the level of the diaphragm. ETT tip is stable above the carina. Low lung volumes. Mild diffuse bilateral hazy lung opacities noted. IMPRESSION: 1. Decreased lung with new mild hazy lung opacities which may reflect portable technique versus mild interstitial edema. 2. Stable support apparatus. . Electronically Signed   By: Kerby Moors M.D.   On: 08/13/2019 08:10   DG CHEST PORT 1 VIEW  Addendum Date: 08/12/2019   ADDENDUM REPORT: 08/12/2019 09:02 ADDENDUM: Interval placement of right-sided PICC line. The distal portions of the PICC line are identified in the projection of the SVC. Cannot confidently identified the distal tip however due to superimposed right IJ catheter. Electronically Signed   By: Kerby Moors M.D.   On: 08/12/2019 09:02   Result Date: 08/12/2019 CLINICAL DATA:  Cardiac arrest.  Respiratory distress. EXAM: PORTABLE CHEST 1 VIEW COMPARISON:  08/05/2019 FINDINGS: Endotracheal tube tip is identified at the level of the carina and appears directed towards the right mainstem bronchus. Right IJ catheter tip is in the right atrium. Heart size is normal. Decreased lung volumes. Pulmonary vascular congestion. No airspace opacities IMPRESSION: 1. Endotracheal tube tip is identified at the level of the carina and appears  directed towards the right mainstem bronchus. 2. Pulmonary vascular congestion. Electronically Signed: By: Kerby Moors M.D. On: 08/12/2019 08:46   DG Abd Portable 1V  Result Date: 08/30/2019 CLINICAL DATA:  Feeding tube placement EXAM: PORTABLE ABDOMEN - 1 VIEW COMPARISON:  08/04/2019 FINDINGS: Nasoenteric feeding tube tip is seen within the gastric antrum. The abdominal gas pattern is nonspecific due to a paucity of intra-abdominal gas. Right flank and pelvis excluded from view. IMPRESSION: Nasoenteric feeding tube tip is within the gastric antrum. Electronically Signed   By: Fidela Salisbury MD   On: 08/30/2019 19:29   DG Swallowing Func-Speech Pathology  Result Date: 09/04/2019 Objective Swallowing Evaluation: Type of Study: MBS-Modified Barium Swallow Study  Patient Details Name: Timothy Le MRN: [id number] Date of Birth: 02-06-1960 Today's Date: 09/04/2019 Time: SLP Start Time (ACUTE ONLY): 1320 -SLP Stop Time (ACUTE ONLY): 1332 SLP Time Calculation (min) (ACUTE ONLY): 12 min Past Medical History: Past Medical History: Diagnosis Date . Asthma  . Chronic respiratory failure with hypoxia ([place]) 07/09/2017 . COPD (chronic obstructive pulmonary disease) ([place])  . Diabetes mellitus without complication ([place])  . Difficult intubation  . Hypertension  . Shortness of breath dyspnea  . Sleep apnea  Past Surgical History: Past Surgical History: Procedure Laterality Date . IR GASTROSTOMY TUBE MOD SED  07/05/2017 . THYROIDECTOMY Left 08/30/2019  Procedure: THYROIDECTOMY CERVICAL LIPECTOMY;  Surgeon: Izora Gala, MD;  Location: [place];  Service: ENT;  Laterality: Left;  NEEDS RNFA PLEASE . TRACHEOSTOMY TUBE PLACEMENT N/A 06/17/2017  Procedure: TRACHEOSTOMY;  Surgeon: Leta Baptist, MD;  Location: [place];  Service: ENT;  Laterality: N/A; . TRACHEOSTOMY TUBE PLACEMENT N/A 08/14/2019  Procedure: TRACHEOSTOMY;  Surgeon: Izora Gala,  MD;  Location: [place];  Service: ENT;  Laterality: N/A; . TRACHEOSTOMY TUBE PLACEMENT N/A 08/30/2019   Procedure: TRACHEOSTOMY  REVISION;  Surgeon: Izora Gala, MD;  [facility];  Service: ENT;  Laterality: N/A; HPI: 59 year old male with baseline chronic respiratory failure on the basis of severe OHS/OSA complicated further by significant obstructive disease in the setting of COPD.  Presents with acute on chronic hypoxic and hypercarbic respiratory failure. Has required mechanical ventilation for most of the hospitalization, extubated on 7/16, had stridor, unsuccessful re-intubation, had emergent tracheostomy which was difficult.  On 7/24 he became agitated and his tracheostomy dislodged, required emergent re-intubation with following failed tracheostomy with cuff leak, now s/p tracheostomy revision with total thyroidectomy and cervical lipectomy.  Pt has a hx of a trach and oropharyngeal dysphagia in 2019 with two FEES completed during that admission.  Subjective: Pt was encountered awake/alert with trach collar Assessment / Plan / Recommendation CHL IP CLINICAL IMPRESSIONS 09/04/2019 Clinical Impression Pt demonstrates adequate airway protection during swallow assessment, only impairment is mild vallecular packing during mastication. PMSV was in place. One instance of flash penetration seen between swallows, likely from oral residue transited to pharynx. Pt is able to resume a regular diet and thin liquids but must wear PMSV during oral intake. No SLP f/u needed for swallowing.  SLP Visit Diagnosis Dysphagia, unspecified (R13.10) Attention and concentration deficit following -- Frontal lobe and executive function deficit following -- Impact on safety and function Mild aspiration risk   CHL IP TREATMENT RECOMMENDATION 09/04/2019 Treatment Recommendations No treatment recommended at this time   Prognosis 09/02/2019 Prognosis for Safe Diet Advancement Fair Barriers to Reach Goals Severity of deficits Barriers/Prognosis Comment -- CHL IP DIET RECOMMENDATION 09/04/2019 SLP Diet Recommendations Regular solids;Thin  liquid Liquid Administration via Cup;Straw Medication Administration Whole meds with liquid Compensations Slow rate;Small sips/bites Postural Changes Seated upright at 90 degrees;Remain semi-upright after after feeds/meals (Comment)   CHL IP OTHER RECOMMENDATIONS 09/04/2019 Recommended Consults -- Oral Care Recommendations -- Other Recommendations Place PMSV during PO intake   CHL IP FOLLOW UP RECOMMENDATIONS 09/02/2019 Follow up Recommendations LTACH;Inpatient Rehab   CHL IP FREQUENCY AND DURATION 09/02/2019 Speech Therapy Frequency (ACUTE ONLY) min 2x/week Treatment Duration 2 weeks      CHL IP ORAL PHASE 09/04/2019 Oral Phase WFL Oral - Pudding Teaspoon -- Oral - Pudding Cup -- Oral - Honey Teaspoon -- Oral - Honey Cup -- Oral - Nectar Teaspoon -- Oral - Nectar Cup -- Oral - Nectar Straw -- Oral - Thin Teaspoon -- Oral - Thin Cup -- Oral - Thin Straw -- Oral - Puree -- Oral - Mech Soft -- Oral - Regular -- Oral - Multi-Consistency -- Oral - Pill -- Oral Phase - Comment --  CHL IP PHARYNGEAL PHASE 09/04/2019 Pharyngeal Phase WFL Pharyngeal- Pudding Teaspoon -- Pharyngeal -- Pharyngeal- Pudding Cup -- Pharyngeal -- Pharyngeal- Honey Teaspoon -- Pharyngeal -- Pharyngeal- Honey Cup -- Pharyngeal -- Pharyngeal- Nectar Teaspoon -- Pharyngeal -- Pharyngeal- Nectar Cup -- Pharyngeal -- Pharyngeal- Nectar Straw -- Pharyngeal -- Pharyngeal- Thin Teaspoon -- Pharyngeal -- Pharyngeal- Thin Cup -- Pharyngeal -- Pharyngeal- Thin Straw -- Pharyngeal -- Pharyngeal- Puree -- Pharyngeal -- Pharyngeal- Mechanical Soft -- Pharyngeal -- Pharyngeal- Regular -- Pharyngeal -- Pharyngeal- Multi-consistency -- Pharyngeal -- Pharyngeal- Pill -- Pharyngeal -- Pharyngeal Comment --  CHL IP CERVICAL ESOPHAGEAL PHASE 09/04/2019 Cervical Esophageal Phase WFL Pudding Teaspoon -- Pudding Cup -- Honey Teaspoon -- Honey Cup -- Nectar Teaspoon -- Nectar Cup -- Nectar Straw --  Thin Teaspoon -- Thin Cup -- Thin Straw -- Puree -- Mechanical Soft -- Regular  -- Multi-consistency -- Pill -- Cervical Esophageal Comment -- Herbie Baltimore, MA CCC-SLP Acute Rehabilitation Services Pager [phone] Office [phone] Lynann Beaver 09/04/2019, 1:59 PM              [place] EKG SITE RITE  Result Date: 08/11/2019 If Site Rite image not attached, placement could not be confirmed due to current cardiac rhythm.    Time Spent in minutes  30     Desiree Hane M.D on 09/09/2019 at 12:59 PM  To page go to [email or web address] - password [facility]

## 2019-09-10 LAB — GLUCOSE, CAPILLARY
Glucose-Capillary: 120 mg/dL — ABNORMAL HIGH (ref 70–99)
Glucose-Capillary: 107 mg/dL — ABNORMAL HIGH (ref 70–99)
Glucose-Capillary: 163 mg/dL — ABNORMAL HIGH (ref 70–99)
Glucose-Capillary: 117 mg/dL — ABNORMAL HIGH (ref 70–99)
Glucose-Capillary: 140 mg/dL — ABNORMAL HIGH (ref 70–99)
Glucose-Capillary: 113 mg/dL — ABNORMAL HIGH (ref 70–99)

## 2019-09-10 NOTE — Plan of Care (Signed)
  Problem: Clinical Measurements: Goal: Ability to maintain clinical measurements within normal limits will improve Outcome: Progressing Goal: Cardiovascular complication will be avoided Outcome: Progressing   Problem: Nutrition: Goal: Adequate nutrition will be maintained Outcome: Progressing

## 2019-09-10 NOTE — Progress Notes (Signed)
  Speech Language Pathology Treatment: Nada Boozer Speaking valve  Patient Details Name: Timothy Le MRN: [id number] DOB: 1960-08-06 Today's Date: 09/10/2019 Time: 7253-6644 SLP Time Calculation (min) (ACUTE ONLY): 15 min  Assessment / Plan / Recommendation Clinical Impression  Pt was seen for treatment with his sister present. Passy-Muir Speaking Valve (PMSV) was in place with cuff deflated upon SLP's entry. Pt was eating and conversing with his sister upon the SLP's entry. He indicated that he has been tolerating the current diet without overt s/sx of aspiration or any difficulty even though he does not like most of the food. He expressed that he has continued using the PMSV during all waking hours and he has demonstrated independence with its care, removal, and placement. He tolerated PMSV for the entirety of the session with stable vitals. Vocal quality was WNL and breath support was adequate. Mild vocal fry was noted toward the ends of some longer utterances but this did not impact speech intelligibility, and pt often self-corrected. Further skilled SLP services are not clinically indicated at this time. Pt has expressed his displeasure regarding his not being able to frequently converse with this SLP during treatment in the future if treatment is discontinued, but he has verbalized agreement with plan of care.    HPI HPI: 59 year old male with baseline chronic respiratory failure on the basis of severe OHS/OSA complicated further by significant obstructive disease in the setting of COPD.  Presents with acute on chronic hypoxic and hypercarbic respiratory failure. Has required mechanical ventilation for most of the hospitalization, extubated on 7/16, had stridor, unsuccessful re-intubation, had emergent tracheostomy which was difficult.  On 7/24 he became agitated and his tracheostomy dislodged, required emergent re-intubation with following failed tracheostomy with cuff leak, now s/p tracheostomy  revision with total thyroidectomy and cervical lipectomy.  Pt has a hx of a trach and oropharyngeal dysphagia in 2019 with two FEES completed during that admission.       SLP Plan  All goals met;Discharge SLP treatment due to (comment)       Recommendations  Diet recommendations: Dysphagia 3 (mechanical soft);Thin liquid Liquids provided via: Cup;Straw Medication Administration: Whole meds with liquid Supervision: Patient able to self feed Compensations: Slow rate;Small sips/bites Postural Changes and/or Swallow Maneuvers: Seated upright 90 degrees      Patient may use Passy-Muir Speech Valve: During all waking hours (remove during sleep) PMSV Supervision: Intermittent MD: Please consider changing trach tube to : Smaller size;Cuffless (when appropriate)         Oral Care Recommendations: Oral care BID;Staff/trained caregiver to provide oral care Follow up Recommendations: LTACH;Inpatient Rehab SLP Visit Diagnosis: Aphonia (R49.1) Plan: All goals met;Discharge SLP treatment due to (comment)       Jasn Xia I. Hardin Negus, [place], [place] Office number [phone] Pager [phone]                Timothy Le 09/10/2019, 5:34 PM

## 2019-09-10 NOTE — Plan of Care (Signed)
  Problem: Education: Goal: Knowledge of General Education information will improve Description: Including pain rating scale, medication(s)/side effects and non-pharmacologic comfort measures Outcome: Progressing   Problem: Health Behavior/Discharge Planning: Goal: Ability to manage health-related needs will improve Outcome: Progressing   Problem: Clinical Measurements: Goal: Will remain free from infection Outcome: Progressing Goal: Diagnostic test results will improve Outcome: Progressing Goal: Respiratory complications will improve Outcome: Progressing Goal: Cardiovascular complication will be avoided Outcome: Progressing   Problem: Activity: Goal: Risk for activity intolerance will decrease Outcome: Progressing   Problem: Activity: Goal: Risk for activity intolerance will decrease Outcome: Progressing

## 2019-09-10 NOTE — Plan of Care (Signed)
  Problem: Education: Goal: Knowledge of General Education information will improve Description: Including pain rating scale, medication(s)/side effects and non-pharmacologic comfort measures Outcome: Progressing   Problem: Health Behavior/Discharge Planning: Goal: Ability to manage health-related needs will improve Outcome: Progressing   Problem: Clinical Measurements: Goal: Ability to maintain clinical measurements within normal limits will improve Outcome: Progressing Goal: Will remain free from infection Outcome: Progressing Goal: Diagnostic test results will improve Outcome: Progressing Goal: Respiratory complications will improve Outcome: Progressing Goal: Cardiovascular complication will be avoided Outcome: Progressing   Problem: Activity: Goal: Risk for activity intolerance will decrease Outcome: Progressing   Problem: Nutrition: Goal: Adequate nutrition will be maintained Outcome: Progressing   Problem: Coping: Goal: Level of anxiety will decrease Outcome: Progressing   Problem: Elimination: Goal: Will not experience complications related to bowel motility Outcome: Progressing Goal: Will not experience complications related to urinary retention Outcome: Progressing   Problem: Pain Managment: Goal: General experience of comfort will improve Outcome: Progressing   Problem: Safety: Goal: Ability to remain free from injury will improve Outcome: Progressing   Problem: Skin Integrity: Goal: Risk for impaired skin integrity will decrease Outcome: Progressing   Problem: Activity: Goal: Ability to tolerate increased activity will improve Outcome: Progressing   Problem: Respiratory: Goal: Ability to maintain a clear airway and adequate ventilation will improve Outcome: Progressing   Problem: Role Relationship: Goal: Method of communication will improve Outcome: Progressing   Problem: Activity: Goal: Ability to tolerate increased activity will  improve Outcome: Progressing   Problem: Respiratory: Goal: Ability to maintain a clear airway and adequate ventilation will improve Outcome: Progressing   Problem: Role Relationship: Goal: Method of communication will improve Outcome: Progressing   

## 2019-09-10 NOTE — Progress Notes (Signed)
[facility]  PROGRESS NOTE  Timothy Le QIH:[id number] DOB: 1960/04/17 DOA: 07/30/2019 PCP: Kerin Perna, NP Admit date - 07/30/2019   Admitting Physician Jacky Kindle, MD  Outpatient Primary MD for the patient is Kerin Perna, NP  LOS - 28 Brief Narrative   Timothy Le is a 59 y.o. year old male with medical history significant for type 2 diabetes, HTN, COPD on home oxygen, morbid obesity, OSA, history of tracheal stenosis and prior tracheostomy who presented on 07/30/2019 with worsening shortness of breath or being off his CPAP for the past 2 weeks (per family it was broken) was found to have acute on chronic hypoxemic/hypercapnic respiratory failure due to OHS/OSA complicated by large thyroid goiter.  Patient required intubation upon presentation to ED due to minimally responsive, with marked hypoxia and presumptive hypercarbia.  Initial CO2 on arrival 64, pH 7.25 with diffuse pulmonary edema on chest x-ray.     Hospital course complicated by failed extubation related to acute respiratory distress failed attempts of reintubation requiring revision colostomy on 7/16.  7/24 tracheostomy became dislodged in the setting of agitation required emergent reintubation followed by tracheostomy revision with ENT on 7/26.  8/9 old mucous plug resulting in bradycardia and CODE BLUE.  Total thyroidectomy and cervical lipectomy and trach revision with ENT on 8/11.  Patient was transferred to progressive care unit under hospital service on 8/15.  8/18 transfer back to stepdown unit due to desaturation in the setting of mucous plugging and significant bradycardia and attempted chest compressions (but no actual loss of pulse)   Bronchoscopy was performed by PCCM on arrival to stepdown unit on 8/18 with no evidence of obstructive mucus or debris and clear visualization of the carina.  Patient has been monitoring progressive unit since then.  Subjective  No acute events overnight.  States  breathing continues to improve, only mild cough. Ate breakfast well A & P  Acute on chronic hypoxic hypercapnic respiratory failure.  Presumed secondary to obesity hypoventilation syndrome/obstructive sleep apnea as well as further complication due to large thyroid goiter.  Bronchoscopy on 8/18 shows clear airways, patient continues to have recurrent episodes of mucous plugging and near cardiac events.  No respiratory issues since 8/18.  Currently on trach collar with 5 L -Close monitoring in progressive unit -Nebulized 3%saline BID per PCCM recs, continue trach collar, pulmonary toilet, -PCCM will continue to follow-up with weekly, not a candidate for downsizing currently -Schedule Mucinex, as needed DuoNebs  Chronic diastolic CHF, euvolemic on exam -continue torsemide 10 mg daily, -Holding home Cardura, started on Lopressor during hospital stay  HLD, stable -continue Lipitor  OSA/OHS -Continue Mucinex -Monitor on trach support  Peripheral neuropathy -Continue Neurontin, started on hospitalization  Type 2 diabetes, poorly controlled A1c 14.9 fasting blood sugar adequate -Holding home Metformin -Continue Levemir 30 units twice daily,, monitor CBG, sliding scale as needed  GERD, stable continue Protonix  Depression, stable continue Zoloft, Seroquel  Gout, stable continue allopurinol  Acute metabolic encephalopathy, resolved.  Has had episodes of agitation like related to prolonged hospitalization, hospital-acquired delirium and metabolic dysfunction related to respiratory failure.  MRI brain showed no acute abnormalities.     Family Communication  : None Code Status : Full  Disposition Plan  :  Patient is from home. Anticipated d/c date: 2 to 3 days. Barriers to d/c or necessity for inpatient status:  Close monitoring of respiratory status given very tenuous with recurrent episodes of bradycardia/new cardiac events in the setting of mucous  plugging related to trach, continue  trach care, PT recommends LTAC  Consults  : PCCM  Procedures  :    Thyroidectomies cervical lipectomy, tracheostomy revision 8/11  DVT Prophylaxis  :  Lovenox  Lab Results  Component Value Date   PLT 188 09/08/2019    Diet :  Diet Order            DIET SOFT Room service appropriate? Yes; Fluid consistency: Thin; Fluid restriction: 1500 mL Fluid  Diet effective now                  Inpatient Medications Scheduled Meds: . allopurinol  100 mg Oral Daily  . atorvastatin  10 mg Oral Daily  . chlorhexidine gluconate (MEDLINE KIT)  15 mL Mouth Rinse BID  . Chlorhexidine Gluconate Cloth  6 each Topical Daily  . dextromethorphan-guaiFENesin  1 tablet Oral BID  . docusate sodium  100 mg Oral BID  . enoxaparin (LOVENOX) injection  0.5 mg/kg Subcutaneous Q24H  . gabapentin  200 mg Oral TID  . insulin aspart  0-20 Units Subcutaneous Q4H  . insulin aspart  5 Units Subcutaneous Q4H  . insulin detemir  30 Units Subcutaneous Q12H  . mouth rinse  15 mL Mouth Rinse 10 times per day  . melatonin  3 mg Oral QHS  . metoprolol tartrate  12.5 mg Oral BID  . multivitamin with minerals  1 tablet Oral Daily  . nutrition supplement (JUVEN)  1 packet Oral BID BM  . pantoprazole  40 mg Oral QHS  . polyethylene glycol  17 g Per Tube Daily  . Ensure Max Protein  11 oz Oral BID  . QUEtiapine  100 mg Oral BID  . sertraline  100 mg Oral Daily  . sodium chloride flush  10-40 mL Intracatheter Q12H  . sodium chloride HYPERTONIC  4 mL Nebulization BID  . torsemide  10 mg Oral Daily   Continuous Infusions: . sodium chloride    . sodium chloride Stopped (08/30/19 1427)   PRN Meds:.Place/Maintain arterial line **AND** sodium chloride, alum & mag hydroxide-simeth, hydrALAZINE, ipratropium-albuterol, oxyCODONE, sodium chloride flush  Antibiotics  :   Anti-infectives (From admission, onward)   Start     Dose/Rate Route Frequency Ordered Stop   08/13/19 1000  cefTRIAXone (ROCEPHIN) 2 g in sodium  chloride 0.9 % 100 mL IVPB  Status:  Discontinued        2 g 200 mL/hr over 30 Minutes Intravenous Every 24 hours 08/13/19 0912 08/15/19 1150   08/12/19 1000  ceFEPIme (MAXIPIME) 2 g in sodium chloride 0.9 % 100 mL IVPB  Status:  Discontinued        2 g 200 mL/hr over 30 Minutes Intravenous Every 8 hours 08/12/19 0939 08/13/19 0912   08/09/19 1700  piperacillin-tazobactam (ZOSYN) IVPB 3.375 g  Status:  Discontinued        3.375 g 12.5 mL/hr over 240 Minutes Intravenous Every 8 hours 08/08/19 1634 08/08/19 1707   08/08/19 1707  piperacillin-tazobactam (ZOSYN) IVPB 3.375 g  Status:  Discontinued        3.375 g 12.5 mL/hr over 240 Minutes Intravenous Every 8 hours 08/08/19 1707 08/09/19 1128   08/04/19 2200  piperacillin-tazobactam (ZOSYN) IVPB 3.375 g  Status:  Discontinued        3.375 g 12.5 mL/hr over 240 Minutes Intravenous Every 8 hours 08/04/19 1413 08/08/19 1634   08/04/19 1300  vancomycin (VANCOREADY) IVPB 1750 mg/350 mL        1,750 mg  175 mL/hr over 120 Minutes Intravenous  Once 08/04/19 1249 08/04/19 1631   08/01/19 1230  cefTRIAXone (ROCEPHIN) 2 g in sodium chloride 0.9 % 100 mL IVPB  Status:  Discontinued        2 g 200 mL/hr over 30 Minutes Intravenous Every 24 hours 08/01/19 1226 08/04/19 1413   08/01/19 1230  azithromycin (ZITHROMAX) 500 mg in sodium chloride 0.9 % 250 mL IVPB  Status:  Discontinued        500 mg 250 mL/hr over 60 Minutes Intravenous Every 24 hours 08/01/19 1226 08/04/19 1413       Objective   Vitals:   09/10/19 0746 09/10/19 0803 09/10/19 1135 09/10/19 1215  BP:    107/61  Pulse: 70     Resp: (!) '21 19  18  ' Temp:  98.1 F (36.7 C) 98.5 F (36.9 C)   TempSrc:  Oral Oral   SpO2: 92%     Weight:      Height:        SpO2: 92 % O2 Flow Rate (L/min): 5 L/min FiO2 (%): 28 %  Wt Readings from Last 3 Encounters:  09/10/19 (!) 147 kg  05/24/19 (!) 152 kg  03/03/19 (!) 156.5 kg     Intake/Output Summary (Last 24 hours) at 09/10/2019  1218 Last data filed at 09/10/2019 0448 Gross per 24 hour  Intake 800 ml  Output 2205 ml  Net -1405 ml    Physical Exam:     Awake Alert, Oriented X 3, Normal affect No new F.N deficits,  Trach in place with PMV cap on  Normal respiratory effort on 5 L via trach collar, decreased breath sounds throughout, scant wheezing RRR,No Gallops,Rubs or new Murmurs,  +ve B.Sounds, Abd Soft, No tenderness, No rebound, guarding or rigidity. No Cyanosis, No new Rash or bruise   Dry skin   I have personally reviewed the following:   Data Reviewed:  CBC Recent Labs  Lab 09/04/19 0430 09/06/19 1655 09/08/19 0840  WBC 4.8 7.4 5.2  HGB 9.1* 9.1* 9.3*  HCT 29.9* 30.1* 30.7*  PLT 131* 151 188  MCV 91.2 90.1 90.6  MCH 27.7 27.2 27.4  MCHC 30.4 30.2 30.3  RDW 16.4* 15.9* 15.9*  LYMPHSABS 1.3 2.0  --   MONOABS 0.3 0.3  --   EOSABS 0.2 0.2  --   BASOSABS 0.0 0.0  --     Chemistries  Recent Labs  Lab 09/04/19 0430 09/06/19 1400 09/06/19 1835 09/07/19 0344 09/08/19 0840  NA 141 142  --  140 141  K 3.8 2.5* 3.8 3.6 4.0  CL 98 114*  --  98 99  CO2 32 23  --  34* 33*  GLUCOSE 117* 86  --  130* 188*  BUN 19 11  --  20 25*  CREATININE 0.75 0.46*  --  0.88 0.94  CALCIUM 8.8* 6.2*  --  8.9 8.9  MG 1.1* 1.0* 1.6* 2.0  --   AST 18  --   --   --   --   ALT 16  --   --   --   --   ALKPHOS 52  --   --   --   --   BILITOT 0.7  --   --   --   --    ------------------------------------------------------------------------------------------------------------------ No results for input(s): CHOL, HDL, LDLCALC, TRIG, CHOLHDL, LDLDIRECT in the last 72 hours.  Lab Results  Component Value Date   HGBA1C 14.9 (H) 08/03/2019   ------------------------------------------------------------------------------------------------------------------  No results for input(s): TSH, T4TOTAL, T3FREE, THYROIDAB in the last 72 hours.  Invalid input(s):  FREET3 ------------------------------------------------------------------------------------------------------------------ No results for input(s): VITAMINB12, FOLATE, FERRITIN, TIBC, IRON, RETICCTPCT in the last 72 hours.  Coagulation profile No results for input(s): INR, PROTIME in the last 168 hours.  No results for input(s): DDIMER in the last 72 hours.  Cardiac Enzymes No results for input(s): CKMB, TROPONINI, MYOGLOBIN in the last 168 hours.  Invalid input(s): CK ------------------------------------------------------------------------------------------------------------------    Component Value Date/Time   BNP 90.9 08/03/2019 0348    Micro Results No results found for this or any previous visit (from the past 240 hour(s)).  Radiology Reports DG CHEST PORT 1 VIEW  Result Date: 09/04/2019 CLINICAL DATA:  Respiratory distress with tracheostomy tube in place. EXAM: PORTABLE CHEST 1 VIEW COMPARISON:  08/28/2019 FINDINGS: The tracheostomy tube tip is above the carina. Right arm PICC line tip is in the cavoatrial junction. There is a feeding tube with tip below the level of the GE junction. Diminished aeration to the left lung base is again noted which may represent atelectasis or airspace disease. IMPRESSION: 1. Satisfactory position of support apparatus. 2. No change in aeration to the left lung base. Electronically Signed   By: Kerby Moors M.D.   On: 09/04/2019 18:43   DG CHEST PORT 1 VIEW  Result Date: 08/28/2019 CLINICAL DATA:  Hypoxia. EXAM: PORTABLE CHEST 1 VIEW COMPARISON:  Single-view of the chest 08/19/2019. FINDINGS: Endotracheal tube and feeding tube remain in place. Left basilar airspace disease seen on the prior examination has improved. The right lung is clear. Heart size is upper normal. No pneumothorax or pleural effusion. IMPRESSION: ET tube and feeding tube remain in good position. Improved left basilar airspace disease. Electronically Signed   By: Inge Rise  M.D.   On: 08/28/2019 13:28   DG Chest Port 1 View  Result Date: 08/19/2019 CLINICAL DATA:  59 year old male with respiratory failure currently intubated EXAM: PORTABLE CHEST 1 VIEW COMPARISON:  Prior chest x-ray 07/19/2019 FINDINGS: The patient remains intubated. The tip of the endotracheal tube is 3.4 cm above the carina. An enteric feeding tube is present but incompletely imaged. The tube passes below the diaphragm and is presumably within the stomach or proximal small bowel. Slightly in proved inspiratory volumes with decreasing right perihilar and right basilar atelectasis. Persistent dense left basilar opacity which remains nonspecific. Background interstitial prominence and bronchitic changes are similar compared to prior. No pneumothorax. IMPRESSION: 1. Slightly improved inspiratory volumes with decreasing right perihilar and right basilar atelectasis. 2. Persistent left greater than right bibasilar airspace opacities which may reflect atelectasis and/or infiltrate. 3. Stable and satisfactory support apparatus. Electronically Signed   By: Jacqulynn Cadet M.D.   On: 08/19/2019 10:25   DG CHEST PORT 1 VIEW  Result Date: 08/18/2019 CLINICAL DATA:  Intubation. EXAM: PORTABLE CHEST 1 VIEW COMPARISON:  Chest x-ray 08/16/2019 FINDINGS: Tracheostomy tube was removed. Endotracheal tube terminates 2 cm above the carina. NG tube courses off the inferior border the film. Heart is enlarged. Bilateral pleural effusions have increased, left greater than right. Bibasilar airspace disease noted. Interstitial edema is present. IMPRESSION: 1. Increasing bilateral pleural effusions, left greater than right. 2. Bibasilar airspace disease likely reflects atelectasis. Infection is not excluded. 3. Cardiomegaly with interstitial edema. Electronically Signed   By: San Morelle M.D.   On: 08/18/2019 04:20   DG CHEST PORT 1 VIEW  Result Date: 08/16/2019 CLINICAL DATA:  Respiratory arrest EXAM: PORTABLE CHEST 1  VIEW COMPARISON:  Three days ago FINDINGS: A tracheostomy tube has been placed. Feeding tube which reaches the stomach. Right PICC with tip at the upper cavoatrial junction. Artifact from support hardware. Significantly improved aeration with better lung volumes and improved edema. Borderline heart size is stable. IMPRESSION: 1. Unremarkable hardware positioning. 2. Symmetric aeration which is improved from 3 days ago. Electronically Signed   By: Monte Fantasia M.D.   On: 08/16/2019 06:37   DG Chest Port 1 View  Result Date: 08/13/2019 CLINICAL DATA:  Acute hypercapnic respiratory failure. EXAM: PORTABLE CHEST 1 VIEW COMPARISON:  08/12/2019 FINDINGS: Feeding tube tip is below the level of the diaphragm. ETT tip is stable above the carina. Low lung volumes. Mild diffuse bilateral hazy lung opacities noted. IMPRESSION: 1. Decreased lung with new mild hazy lung opacities which may reflect portable technique versus mild interstitial edema. 2. Stable support apparatus. . Electronically Signed   By: Kerby Moors M.D.   On: 08/13/2019 08:10   DG CHEST PORT 1 VIEW  Addendum Date: 08/12/2019   ADDENDUM REPORT: 08/12/2019 09:02 ADDENDUM: Interval placement of right-sided PICC line. The distal portions of the PICC line are identified in the projection of the SVC. Cannot confidently identified the distal tip however due to superimposed right IJ catheter. Electronically Signed   By: Kerby Moors M.D.   On: 08/12/2019 09:02   Result Date: 08/12/2019 CLINICAL DATA:  Cardiac arrest.  Respiratory distress. EXAM: PORTABLE CHEST 1 VIEW COMPARISON:  08/05/2019 FINDINGS: Endotracheal tube tip is identified at the level of the carina and appears directed towards the right mainstem bronchus. Right IJ catheter tip is in the right atrium. Heart size is normal. Decreased lung volumes. Pulmonary vascular congestion. No airspace opacities IMPRESSION: 1. Endotracheal tube tip is identified at the level of the carina and appears  directed towards the right mainstem bronchus. 2. Pulmonary vascular congestion. Electronically Signed: By: Kerby Moors M.D. On: 08/12/2019 08:46   DG Abd Portable 1V  Result Date: 08/30/2019 CLINICAL DATA:  Feeding tube placement EXAM: PORTABLE ABDOMEN - 1 VIEW COMPARISON:  08/04/2019 FINDINGS: Nasoenteric feeding tube tip is seen within the gastric antrum. The abdominal gas pattern is nonspecific due to a paucity of intra-abdominal gas. Right flank and pelvis excluded from view. IMPRESSION: Nasoenteric feeding tube tip is within the gastric antrum. Electronically Signed   By: Fidela Salisbury MD   On: 08/30/2019 19:29   DG Swallowing Func-Speech Pathology  Result Date: 09/04/2019 Objective Swallowing Evaluation: Type of Study: MBS-Modified Barium Swallow Study  Patient Details Name: ODIES DESA MRN: [id number] Date of Birth: March 12, 1960 Today's Date: 09/04/2019 Time: SLP Start Time (ACUTE ONLY): 1320 -SLP Stop Time (ACUTE ONLY): 1332 SLP Time Calculation (min) (ACUTE ONLY): 12 min Past Medical History: Past Medical History: Diagnosis Date . Asthma  . Chronic respiratory failure with hypoxia ([place]) 07/09/2017 . COPD (chronic obstructive pulmonary disease) ([place])  . Diabetes mellitus without complication ([place])  . Difficult intubation  . Hypertension  . Shortness of breath dyspnea  . Sleep apnea  Past Surgical History: Past Surgical History: Procedure Laterality Date . IR GASTROSTOMY TUBE MOD SED  07/05/2017 . THYROIDECTOMY Left 08/30/2019  Procedure: THYROIDECTOMY CERVICAL LIPECTOMY;  Surgeon: Izora Gala, MD;  Location: [place];  Service: ENT;  Laterality: Left;  NEEDS RNFA PLEASE . TRACHEOSTOMY TUBE PLACEMENT N/A 06/17/2017  Procedure: TRACHEOSTOMY;  Surgeon: Leta Baptist, MD;  Location: [place];  Service: ENT;  Laterality: N/A; . TRACHEOSTOMY TUBE PLACEMENT N/A 08/14/2019  Procedure: TRACHEOSTOMY;  Surgeon: Izora Gala,  MD;  Location: [place];  Service: ENT;  Laterality: N/A; . TRACHEOSTOMY TUBE PLACEMENT N/A 08/30/2019   Procedure: TRACHEOSTOMY  REVISION;  Surgeon: Izora Gala, MD;  Location[facility];  Service: ENT;  Laterality: N/A; HPI: 59 year old male with baseline chronic respiratory failure on the basis of severe OHS/OSA complicated further by significant obstructive disease in the setting of COPD.  Presents with acute on chronic hypoxic and hypercarbic respiratory failure. Has required mechanical ventilation for most of the hospitalization, extubated on 7/16, had stridor, unsuccessful re-intubation, had emergent tracheostomy which was difficult.  On 7/24 he became agitated and his tracheostomy dislodged, required emergent re-intubation with following failed tracheostomy with cuff leak, now s/p tracheostomy revision with total thyroidectomy and cervical lipectomy.  Pt has a hx of a trach and oropharyngeal dysphagia in 2019 with two FEES completed during that admission.  Subjective: Pt was encountered awake/alert with trach collar Assessment / Plan / Recommendation CHL IP CLINICAL IMPRESSIONS 09/04/2019 Clinical Impression Pt demonstrates adequate airway protection during swallow assessment, only impairment is mild vallecular packing during mastication. PMSV was in place. One instance of flash penetration seen between swallows, likely from oral residue transited to pharynx. Pt is able to resume a regular diet and thin liquids but must wear PMSV during oral intake. No SLP f/u needed for swallowing.  SLP Visit Diagnosis Dysphagia, unspecified (R13.10) Attention and concentration deficit following -- Frontal lobe and executive function deficit following -- Impact on safety and function Mild aspiration risk   CHL IP TREATMENT RECOMMENDATION 09/04/2019 Treatment Recommendations No treatment recommended at this time   Prognosis 09/02/2019 Prognosis for Safe Diet Advancement Fair Barriers to Reach Goals Severity of deficits Barriers/Prognosis Comment -- CHL IP DIET RECOMMENDATION 09/04/2019 SLP Diet Recommendations Regular solids;Thin  liquid Liquid Administration via Cup;Straw Medication Administration Whole meds with liquid Compensations Slow rate;Small sips/bites Postural Changes Seated upright at 90 degrees;Remain semi-upright after after feeds/meals (Comment)   CHL IP OTHER RECOMMENDATIONS 09/04/2019 Recommended Consults -- Oral Care Recommendations -- Other Recommendations Place PMSV during PO intake   CHL IP FOLLOW UP RECOMMENDATIONS 09/02/2019 Follow up Recommendations LTACH;Inpatient Rehab   CHL IP FREQUENCY AND DURATION 09/02/2019 Speech Therapy Frequency (ACUTE ONLY) min 2x/week Treatment Duration 2 weeks      CHL IP ORAL PHASE 09/04/2019 Oral Phase WFL Oral - Pudding Teaspoon -- Oral - Pudding Cup -- Oral - Honey Teaspoon -- Oral - Honey Cup -- Oral - Nectar Teaspoon -- Oral - Nectar Cup -- Oral - Nectar Straw -- Oral - Thin Teaspoon -- Oral - Thin Cup -- Oral - Thin Straw -- Oral - Puree -- Oral - Mech Soft -- Oral - Regular -- Oral - Multi-Consistency -- Oral - Pill -- Oral Phase - Comment --  CHL IP PHARYNGEAL PHASE 09/04/2019 Pharyngeal Phase WFL Pharyngeal- Pudding Teaspoon -- Pharyngeal -- Pharyngeal- Pudding Cup -- Pharyngeal -- Pharyngeal- Honey Teaspoon -- Pharyngeal -- Pharyngeal- Honey Cup -- Pharyngeal -- Pharyngeal- Nectar Teaspoon -- Pharyngeal -- Pharyngeal- Nectar Cup -- Pharyngeal -- Pharyngeal- Nectar Straw -- Pharyngeal -- Pharyngeal- Thin Teaspoon -- Pharyngeal -- Pharyngeal- Thin Cup -- Pharyngeal -- Pharyngeal- Thin Straw -- Pharyngeal -- Pharyngeal- Puree -- Pharyngeal -- Pharyngeal- Mechanical Soft -- Pharyngeal -- Pharyngeal- Regular -- Pharyngeal -- Pharyngeal- Multi-consistency -- Pharyngeal -- Pharyngeal- Pill -- Pharyngeal -- Pharyngeal Comment --  CHL IP CERVICAL ESOPHAGEAL PHASE 09/04/2019 Cervical Esophageal Phase WFL Pudding Teaspoon -- Pudding Cup -- Honey Teaspoon -- Honey Cup -- Nectar Teaspoon -- Nectar Cup -- Nectar Straw --  Thin Teaspoon -- Thin Cup -- Thin Straw -- Puree -- Mechanical Soft -- Regular  -- Multi-consistency -- Pill -- Cervical Esophageal Comment -- Herbie Baltimore, MA CCC-SLP Acute Rehabilitation Services Pager [phone] Office [phone] Lynann Beaver 09/04/2019, 1:59 PM                Time Spent in minutes  30     Desiree Hane M.D on 09/10/2019 at 12:18 PM  To page go to [email or web address] - password [facility]

## 2019-09-11 LAB — GLUCOSE, CAPILLARY
Glucose-Capillary: 115 mg/dL — ABNORMAL HIGH (ref 70–99)
Glucose-Capillary: 108 mg/dL — ABNORMAL HIGH (ref 70–99)
Glucose-Capillary: 148 mg/dL — ABNORMAL HIGH (ref 70–99)
Glucose-Capillary: 118 mg/dL — ABNORMAL HIGH (ref 70–99)
Glucose-Capillary: 224 mg/dL — ABNORMAL HIGH (ref 70–99)

## 2019-09-11 LAB — BASIC METABOLIC PANEL
Anion gap: 10 (ref 5–15)
BUN: 25 mg/dL — ABNORMAL HIGH (ref 6–20)
CO2: 33 mmol/L — ABNORMAL HIGH (ref 22–32)
Calcium: 9.1 mg/dL (ref 8.9–10.3)
Chloride: 100 mmol/L (ref 98–111)
Creatinine, Ser: 0.88 mg/dL (ref 0.61–1.24)
GFR calc Af Amer: >60 mL/min (ref >60)
GFR calc non Af Amer: >60 mL/min (ref >60)
Glucose, Bld: 121 mg/dL — ABNORMAL HIGH (ref 70–99)
Potassium: 3.6 mmol/L (ref 3.5–5.1)
Sodium: 143 mmol/L (ref 135–145)

## 2019-09-11 LAB — CBC
HCT: 32.2 % — ABNORMAL LOW (ref 39.0–52.0)
Hemoglobin: 9.7 g/dL — ABNORMAL LOW (ref 13.0–17.0)
MCH: 27.3 pg (ref 26.0–34.0)
MCHC: 30.1 g/dL (ref 30.0–36.0)
MCV: 90.7 fL (ref 80.0–100.0)
Platelets: 248 10*3/uL (ref 150–400)
RBC: 3.55 MIL/uL — ABNORMAL LOW (ref 4.22–5.81)
RDW: 15.9 % — ABNORMAL HIGH (ref 11.5–15.5)
WBC: 5.3 10*3/uL (ref 4.0–10.5)
nRBC: 0 % (ref 0.0–0.2)

## 2019-09-11 MED ORDER — ACETAMINOPHEN 325 MG PO TABS: 650.0000 mg | ORAL_TABLET | Freq: Four times a day (QID) | ORAL | Status: DC | PRN

## 2019-09-11 MED ORDER — SENNOSIDES-DOCUSATE SODIUM 8.6-50 MG PO TABS
2.0000 | ORAL_TABLET | Freq: Two times a day (BID) | ORAL | Status: DC
  Administered 2019-09-11 – 2019-09-15 (×8): 2 via ORAL
  Filled 2019-09-11 (×9): qty 2

## 2019-09-11 NOTE — Progress Notes (Signed)
Rehab Admissions Coordinator Note:  Patient was screened by Cleatrice Burke for appropriateness for an Inpatient Acute Rehab Consult per Therapy change in their recommendation.   At this time, we are recommending Inpatient Rehab consult. I will place order per protocol.  Cleatrice Burke RN MSN 09/11/2019, 12:51 PM  I can be reached at [phone].

## 2019-09-11 NOTE — Plan of Care (Signed)
°  Problem: Education: °Goal: Knowledge of General Education information will improve °Description: Including pain rating scale, medication(s)/side effects and non-pharmacologic comfort measures °Outcome: Progressing °  °Problem: Health Behavior/Discharge Planning: °Goal: Ability to manage health-related needs will improve °Outcome: Progressing °  °Problem: Clinical Measurements: °Goal: Ability to maintain clinical measurements within normal limits will improve °Outcome: Progressing °Goal: Will remain free from infection °Outcome: Progressing °Goal: Cardiovascular complication will be avoided °Outcome: Progressing °  °Problem: Activity: °Goal: Risk for activity intolerance will decrease °Outcome: Progressing °  °Problem: Nutrition: °Goal: Adequate nutrition will be maintained °Outcome: Progressing °  °

## 2019-09-11 NOTE — Progress Notes (Signed)
Physical Therapy Treatment Patient Details Name: Timothy Le MRN: [id number] DOB: 09-Dec-1960 Today's Date: 09/11/2019    History of Present Illness Pt is 59 year old African-American male with PMH significant for DM 2, HTN, COPD on home oxygen, morbid obesity, obstructive sleep apnea. Patient presented to the ED on 07/30/2019 with worsening shortness of breath. Initial work-up suggested pulmonary edema and decompensated diastolic heart failure. He was intubated in the ED and admitted to ICU.  He required mechanical ventilation for most of the hospitalization. On 8/11 he underwent total thyroidectomy and cervical lipectomy with suturing of skin flaps to to trachea for tracheostomy. On 09/05/19 pt tx to ICU due to mucus plug.    PT Comments    Patient very motivated to work with PT today and to walk. Tolerated gait training with Mod A for balance, safety and RW management. Pt with marked weakness in BLEs and fatigues quickly needing chair brought behind him for safety due to buckling. Noted to have 2-3/4 DOE. VSS on 28% Fi02 5L trach collar. Pt noted to be impulsive and have poor awareness of safety/deficits. Disposition updated to CIR as pt highly motivated and is progressing well. Will continue to follow.    Follow Up Recommendations  CIR;Supervision/Assistance - 24 hour     Equipment Recommendations  Other (comment) (TBD)    Recommendations for Other Services       Precautions / Restrictions Precautions Precautions: Fall Precaution Comments: trach collar, PMV Restrictions Weight Bearing Restrictions: No    Mobility  Bed Mobility Overal bed mobility: Needs Assistance Bed Mobility: Rolling;Sidelying to Sit Rolling: Supervision Sidelying to sit: Supervision       General bed mobility comments: Able to get to EOB using rail and momentum, no assist needed.  Transfers Overall transfer level: Needs assistance Equipment used: Rolling walker (2 wheeled) Transfers: Sit to/from  Stand Sit to Stand: Mod assist;+2 physical assistance         General transfer comment: Assist of 2 to stand from EOB x1, from chair x1, from bSC x1; SPT chair to/from [facility] with Min A, marked weakness in BLEs, locking out knees for stability. Impulsive.  Ambulation/Gait Ambulation/Gait assistance: Mod assist;+2 safety/equipment Gait Distance (Feet): 12 Feet Assistive device: Rolling walker (2 wheeled) Gait Pattern/deviations: Step-to pattern;Wide base of support;Trunk flexed   Gait velocity interpretation: <1.8 ft/sec, indicate of risk for recurrent falls General Gait Details: Incoordinated and impulsive gait with wide BoS determined to get to bathroom; fatigues quickly with 2-3/4 DOE; needs max cues for safety. Chair brought to pt due to LE weakness/buckling. Sp02 >91% on 28% Fi02 on 5L 02.   Stairs             Wheelchair Mobility    Modified Rankin (Stroke Patients Only)       Balance Overall balance assessment: Needs assistance Sitting-balance support: No upper extremity supported;Feet supported Sitting balance-Leahy Scale: Good Sitting balance - Comments: Able to sit EOB without support.   Standing balance support: During functional activity Standing balance-Leahy Scale: Poor Standing balance comment: Requires UE support in standing and external support for dynamic tasks; total A for pericare.                            Cognition Arousal/Alertness: Awake/alert Behavior During Therapy: Impulsive Overall Cognitive Status: Impaired/Different from baseline Area of Impairment: Safety/judgement  Safety/Judgement: Decreased awareness of safety;Decreased awareness of deficits     General Comments: poor awareness of safety/deficits. very motivated. "I am going to get into that bathroom."      Exercises General Exercises - Lower Extremity Ankle Circles/Pumps: AROM;Both;10 reps;Supine Quad Sets: AROM;Both;10  reps;Supine Straight Leg Raises: AROM;Both;5 reps;Supine    General Comments General comments (skin integrity, edema, etc.): VSS on 28% Fi02 5L 02, trach collar.      Pertinent Vitals/Pain Pain Assessment: No/denies pain    Home Living                      Prior Function            PT Goals (current goals can now be found in the care plan section) Progress towards PT goals: Progressing toward goals    Frequency    Min 3X/week      PT Plan Discharge plan needs to be updated    Co-evaluation              AM-PAC PT "6 Clicks" Mobility   Outcome Measure  Help needed turning from your back to your side while in a flat bed without using bedrails?: A Little Help needed moving from lying on your back to sitting on the side of a flat bed without using bedrails?: A Little Help needed moving to and from a bed to a chair (including a wheelchair)?: A Lot Help needed standing up from a chair using your arms (e.g., wheelchair or bedside chair)?: A Lot Help needed to walk in hospital room?: A Lot Help needed climbing 3-5 steps with a railing? : Total 6 Click Score: 13    End of Session Equipment Utilized During Treatment: Gait belt;Oxygen Activity Tolerance: Patient tolerated treatment well;Patient limited by fatigue Patient left: in chair;with call bell/phone within reach Nurse Communication: Mobility status PT Visit Diagnosis: Unsteadiness on feet (R26.81);Other abnormalities of gait and mobility (R26.89)     Time: 6387-5643 PT Time Calculation (min) (ACUTE ONLY): 37 min  Charges:  $Gait Training: 8-22 mins $Therapeutic Activity: 8-22 mins                     Marisa Severin, PT, DPT Acute Rehabilitation Services Pager [phone] Office [place] 09/11/2019, 12:21 PM

## 2019-09-11 NOTE — Progress Notes (Signed)
[facility]  PROGRESS NOTE  Timothy Le PTW:[id number] DOB: 01-Feb-1960 DOA: 07/30/2019 PCP: Kerin Perna, NP Admit date - 07/30/2019   Admitting Physician Jacky Kindle, MD  Outpatient Primary MD for the patient is Kerin Perna, NP  LOS - 55 Brief Narrative   Timothy Le is a 59 y.o. year old male with medical history significant for type 2 diabetes, HTN, COPD on home oxygen, morbid obesity, OSA, history of tracheal stenosis and prior tracheostomy who presented on 07/30/2019 with worsening shortness of breath or being off his CPAP for the past 2 weeks (per family it was broken) was found to have acute on chronic hypoxemic/hypercapnic respiratory failure due to OHS/OSA complicated by large thyroid goiter.  Patient required intubation upon presentation to ED due to minimally responsive, with marked hypoxia and presumptive hypercarbia.  Initial CO2 on arrival 64, pH 7.25 with diffuse pulmonary edema on chest x-ray.     Hospital course complicated by failed extubation related to acute respiratory distress failed attempts of reintubation requiring revision colostomy on 7/16.  7/24 tracheostomy became dislodged in the setting of agitation required emergent reintubation followed by tracheostomy revision with ENT on 7/26.  8/9 old mucous plug resulting in bradycardia and CODE BLUE.  Total thyroidectomy and cervical lipectomy and trach revision with ENT on 8/11.  Patient was transferred to progressive care unit under hospital service on 8/15.  8/18 transfer back to stepdown unit due to desaturation in the setting of mucous plugging and significant bradycardia and attempted chest compressions (but no actual loss of pulse)   Bronchoscopy was performed by PCCM on arrival to stepdown unit on 8/18 with no evidence of obstructive mucus or debris and clear visualization of the carina.  Patient has been monitoring progressive unit since then.  Subjective  No acute events overnight.  Did  well ambulating in hall with PT today. Breathing continues to be stable. Mild cough. Eating well. Reports small, hard stool.  A & P  Acute on chronic hypoxic hypercapnic respiratory failure.  Presumed secondary to obesity hypoventilation syndrome/obstructive sleep apnea as well as further complication due to large thyroid goiter.  Bronchoscopy on 8/18 shows clear airways, patient continues to have recurrent episodes of mucous plugging and near cardiac events.  No respiratory issues since 8/18.  Currently on trach collar with 5 L -Close monitoring in progressive unit -Nebulized 3%saline BID per PCCM recs, continue trach collar, pulmonary toilet, -PCCM will continue to follow-up with weekly, not a candidate for downsizing currently -Schedule Mucinex, as needed DuoNebs  Chronic diastolic CHF, euvolemic on exam -continue torsemide 10 mg daily, -Holding home Cardura, started on Lopressor during hospital stay  HLD, stable -continue Lipitor  OSA/OHS -Continue Mucinex -Monitor on trach support  Peripheral neuropathy -Continue Neurontin, started on hospitalization  Type 2 diabetes, poorly controlled A1c 14.9 fasting blood sugar adequate -Holding home Metformin -Continue Levemir 30 units twice daily,, monitor CBG, sliding scale as needed  GERD, stable continue Protonix  Depression, stable continue Zoloft, Seroquel  Gout, stable continue allopurinol  Acute metabolic encephalopathy, resolved.  Has had episodes of agitation like related to prolonged hospitalization, hospital-acquired delirium and metabolic dysfunction related to respiratory failure.  MRI brain showed no acute abnormalities.     Family Communication  : None Code Status : Full  Disposition Plan  :  Patient is from home. Anticipated d/c date: 2 to 3 days. Barriers to d/c or necessity for inpatient status:  Close monitoring of respiratory status given very tenuous with  recurrent episodes of bradycardia/new cardiac events in  the setting of mucous plugging related to trach, continue trach care, PT recommends CIR given improvement in motivation and ambulation, CIR consulted for evaluation Consults  : PCCM  Procedures  :    Thyroidectomies cervical lipectomy, tracheostomy revision 8/11  DVT Prophylaxis  :  Lovenox  Lab Results  Component Value Date   PLT 248 09/11/2019    Diet :  Diet Order            DIET SOFT Room service appropriate? Yes; Fluid consistency: Thin; Fluid restriction: 1500 mL Fluid  Diet effective now                  Inpatient Medications Scheduled Meds: . allopurinol  100 mg Oral Daily  . atorvastatin  10 mg Oral Daily  . chlorhexidine gluconate (MEDLINE KIT)  15 mL Mouth Rinse BID  . Chlorhexidine Gluconate Cloth  6 each Topical Daily  . dextromethorphan-guaiFENesin  1 tablet Oral BID  . docusate sodium  100 mg Oral BID  . enoxaparin (LOVENOX) injection  0.5 mg/kg Subcutaneous Q24H  . gabapentin  200 mg Oral TID  . insulin aspart  0-20 Units Subcutaneous Q4H  . insulin aspart  5 Units Subcutaneous Q4H  . insulin detemir  30 Units Subcutaneous Q12H  . mouth rinse  15 mL Mouth Rinse 10 times per day  . melatonin  3 mg Oral QHS  . metoprolol tartrate  12.5 mg Oral BID  . multivitamin with minerals  1 tablet Oral Daily  . nutrition supplement (JUVEN)  1 packet Oral BID BM  . pantoprazole  40 mg Oral QHS  . polyethylene glycol  17 g Per Tube Daily  . Ensure Max Protein  11 oz Oral BID  . QUEtiapine  100 mg Oral BID  . sertraline  100 mg Oral Daily  . sodium chloride flush  10-40 mL Intracatheter Q12H  . sodium chloride HYPERTONIC  4 mL Nebulization BID  . torsemide  10 mg Oral Daily   Continuous Infusions: . sodium chloride    . sodium chloride Stopped (08/30/19 1427)   PRN Meds:.Place/Maintain arterial line **AND** sodium chloride, alum & mag hydroxide-simeth, hydrALAZINE, ipratropium-albuterol, oxyCODONE, sodium chloride flush  Antibiotics  :   Anti-infectives (From  admission, onward)   Start     Dose/Rate Route Frequency Ordered Stop   08/13/19 1000  cefTRIAXone (ROCEPHIN) 2 g in sodium chloride 0.9 % 100 mL IVPB  Status:  Discontinued        2 g 200 mL/hr over 30 Minutes Intravenous Every 24 hours 08/13/19 0912 08/15/19 1150   08/12/19 1000  ceFEPIme (MAXIPIME) 2 g in sodium chloride 0.9 % 100 mL IVPB  Status:  Discontinued        2 g 200 mL/hr over 30 Minutes Intravenous Every 8 hours 08/12/19 0939 08/13/19 0912   08/09/19 1700  piperacillin-tazobactam (ZOSYN) IVPB 3.375 g  Status:  Discontinued        3.375 g 12.5 mL/hr over 240 Minutes Intravenous Every 8 hours 08/08/19 1634 08/08/19 1707   08/08/19 1707  piperacillin-tazobactam (ZOSYN) IVPB 3.375 g  Status:  Discontinued        3.375 g 12.5 mL/hr over 240 Minutes Intravenous Every 8 hours 08/08/19 1707 08/09/19 1128   08/04/19 2200  piperacillin-tazobactam (ZOSYN) IVPB 3.375 g  Status:  Discontinued        3.375 g 12.5 mL/hr over 240 Minutes Intravenous Every 8 hours 08/04/19 1413 08/08/19 1634  08/04/19 1300  vancomycin (VANCOREADY) IVPB 1750 mg/350 mL        1,750 mg 175 mL/hr over 120 Minutes Intravenous  Once 08/04/19 1249 08/04/19 1631   08/01/19 1230  cefTRIAXone (ROCEPHIN) 2 g in sodium chloride 0.9 % 100 mL IVPB  Status:  Discontinued        2 g 200 mL/hr over 30 Minutes Intravenous Every 24 hours 08/01/19 1226 08/04/19 1413   08/01/19 1230  azithromycin (ZITHROMAX) 500 mg in sodium chloride 0.9 % 250 mL IVPB  Status:  Discontinued        500 mg 250 mL/hr over 60 Minutes Intravenous Every 24 hours 08/01/19 1226 08/04/19 1413       Objective   Vitals:   09/11/19 0730 09/11/19 0830 09/11/19 1130 09/11/19 1521  BP: 131/82     Pulse: 72 76 74 73  Resp: _0 Temp: 97.8 F (36.6 C)     TempSrc: Axillary     SpO2: 92% 94% 92% 95%  Weight:      Height:        SpO2: 95 % O2 Flow Rate (L/min): 5 L/min FiO2 (%): 28 %  Wt Readings from Last 3 Encounters:  09/10/19 (!)  147 kg  05/24/19 (!) 152 kg  03/03/19 (!) 156.5 kg     Intake/Output Summary (Last 24 hours) at 09/11/2019 1531 Last data filed at 09/11/2019 0300 Gross per 24 hour  Intake --  Output 1525 ml  Net -1525 ml    Physical Exam:     Awake Alert, Oriented X 3, Normal affect, sitting comfortably in bedside chair No new F.N deficits,  Trach in place with PMV cap on  Normal respiratory effort on 5 L via trach collar, decreased breath sounds throughout, no wheezing RRR,No Gallops,Rubs or new Murmurs,  +ve B.Sounds, Abd Soft, No tenderness, No rebound, guarding or rigidity. No Cyanosis, No new Rash or bruise   Dry skin   I have personally reviewed the following:   Data Reviewed:  CBC Recent Labs  Lab 09/06/19 1655 09/08/19 0840 09/11/19 0515  WBC 7.4 5.2 5.3  HGB 9.1* 9.3* 9.7*  HCT 30.1* 30.7* 32.2*  PLT 151 188 248  MCV 90.1 90.6 90.7  MCH 27.2 27.4 27.3  MCHC 30.2 30.3 30.1  RDW 15.9* 15.9* 15.9*  LYMPHSABS 2.0  --   --   MONOABS 0.3  --   --   EOSABS 0.2  --   --   BASOSABS 0.0  --   --     Chemistries  Recent Labs  Lab 09/06/19 1400 09/06/19 1835 09/07/19 0344 09/08/19 0840 09/11/19 0515  NA 142  --  140 141 143  K 2.5* 3.8 3.6 4.0 3.6  CL 114*  --  98 99 100  CO2 23  --  34* 33* 33*  GLUCOSE 86  --  130* 188* 121*  BUN 11  --  20 25* 25*  CREATININE 0.46*  --  0.88 0.94 0.88  CALCIUM 6.2*  --  8.9 8.9 9.1  MG 1.0* 1.6* 2.0  --   --    ------------------------------------------------------------------------------------------------------------------ No results for input(s): CHOL, HDL, LDLCALC, TRIG, CHOLHDL, LDLDIRECT in the last 72 hours.  Lab Results  Component Value Date   HGBA1C 14.9 (H) 08/03/2019   ------------------------------------------------------------------------------------------------------------------ No results for input(s): TSH, T4TOTAL, T3FREE, THYROIDAB in the last 72 hours.  Invalid input(s):  FREET3 ------------------------------------------------------------------------------------------------------------------ No results for input(s): VITAMINB12, FOLATE, FERRITIN, TIBC, IRON, RETICCTPCT in  the last 72 hours.  Coagulation profile No results for input(s): INR, PROTIME in the last 168 hours.  No results for input(s): DDIMER in the last 72 hours.  Cardiac Enzymes No results for input(s): CKMB, TROPONINI, MYOGLOBIN in the last 168 hours.  Invalid input(s): CK ------------------------------------------------------------------------------------------------------------------    Component Value Date/Time   BNP 90.9 08/03/2019 0348    Micro Results No results found for this or any previous visit (from the past 240 hour(s)).  Radiology Reports DG CHEST PORT 1 VIEW  Result Date: 09/04/2019 CLINICAL DATA:  Respiratory distress with tracheostomy tube in place. EXAM: PORTABLE CHEST 1 VIEW COMPARISON:  08/28/2019 FINDINGS: The tracheostomy tube tip is above the carina. Right arm PICC line tip is in the cavoatrial junction. There is a feeding tube with tip below the level of the GE junction. Diminished aeration to the left lung base is again noted which may represent atelectasis or airspace disease. IMPRESSION: 1. Satisfactory position of support apparatus. 2. No change in aeration to the left lung base. Electronically Signed   By: Kerby Moors M.D.   On: 09/04/2019 18:43   DG CHEST PORT 1 VIEW  Result Date: 08/28/2019 CLINICAL DATA:  Hypoxia. EXAM: PORTABLE CHEST 1 VIEW COMPARISON:  Single-view of the chest 08/19/2019. FINDINGS: Endotracheal tube and feeding tube remain in place. Left basilar airspace disease seen on the prior examination has improved. The right lung is clear. Heart size is upper normal. No pneumothorax or pleural effusion. IMPRESSION: ET tube and feeding tube remain in good position. Improved left basilar airspace disease. Electronically Signed   By: Inge Rise  M.D.   On: 08/28/2019 13:28   DG Chest Port 1 View  Result Date: 08/19/2019 CLINICAL DATA:  59 year old male with respiratory failure currently intubated EXAM: PORTABLE CHEST 1 VIEW COMPARISON:  Prior chest x-ray 07/19/2019 FINDINGS: The patient remains intubated. The tip of the endotracheal tube is 3.4 cm above the carina. An enteric feeding tube is present but incompletely imaged. The tube passes below the diaphragm and is presumably within the stomach or proximal small bowel. Slightly in proved inspiratory volumes with decreasing right perihilar and right basilar atelectasis. Persistent dense left basilar opacity which remains nonspecific. Background interstitial prominence and bronchitic changes are similar compared to prior. No pneumothorax. IMPRESSION: 1. Slightly improved inspiratory volumes with decreasing right perihilar and right basilar atelectasis. 2. Persistent left greater than right bibasilar airspace opacities which may reflect atelectasis and/or infiltrate. 3. Stable and satisfactory support apparatus. Electronically Signed   By: Jacqulynn Cadet M.D.   On: 08/19/2019 10:25   DG CHEST PORT 1 VIEW  Result Date: 08/18/2019 CLINICAL DATA:  Intubation. EXAM: PORTABLE CHEST 1 VIEW COMPARISON:  Chest x-ray 08/16/2019 FINDINGS: Tracheostomy tube was removed. Endotracheal tube terminates 2 cm above the carina. NG tube courses off the inferior border the film. Heart is enlarged. Bilateral pleural effusions have increased, left greater than right. Bibasilar airspace disease noted. Interstitial edema is present. IMPRESSION: 1. Increasing bilateral pleural effusions, left greater than right. 2. Bibasilar airspace disease likely reflects atelectasis. Infection is not excluded. 3. Cardiomegaly with interstitial edema. Electronically Signed   By: San Morelle M.D.   On: 08/18/2019 04:20   DG CHEST PORT 1 VIEW  Result Date: 08/16/2019 CLINICAL DATA:  Respiratory arrest EXAM: PORTABLE CHEST 1  VIEW COMPARISON:  Three days ago FINDINGS: A tracheostomy tube has been placed. Feeding tube which reaches the stomach. Right PICC with tip at the upper cavoatrial junction. Artifact from support hardware.  Significantly improved aeration with better lung volumes and improved edema. Borderline heart size is stable. IMPRESSION: 1. Unremarkable hardware positioning. 2. Symmetric aeration which is improved from 3 days ago. Electronically Signed   By: Monte Fantasia M.D.   On: 08/16/2019 06:37   DG Chest Port 1 View  Result Date: 08/13/2019 CLINICAL DATA:  Acute hypercapnic respiratory failure. EXAM: PORTABLE CHEST 1 VIEW COMPARISON:  08/12/2019 FINDINGS: Feeding tube tip is below the level of the diaphragm. ETT tip is stable above the carina. Low lung volumes. Mild diffuse bilateral hazy lung opacities noted. IMPRESSION: 1. Decreased lung with new mild hazy lung opacities which may reflect portable technique versus mild interstitial edema. 2. Stable support apparatus. . Electronically Signed   By: Kerby Moors M.D.   On: 08/13/2019 08:10   DG Abd Portable 1V  Result Date: 08/30/2019 CLINICAL DATA:  Feeding tube placement EXAM: PORTABLE ABDOMEN - 1 VIEW COMPARISON:  08/04/2019 FINDINGS: Nasoenteric feeding tube tip is seen within the gastric antrum. The abdominal gas pattern is nonspecific due to a paucity of intra-abdominal gas. Right flank and pelvis excluded from view. IMPRESSION: Nasoenteric feeding tube tip is within the gastric antrum. Electronically Signed   By: Fidela Salisbury MD   On: 08/30/2019 19:29   DG Swallowing Func-Speech Pathology  Result Date: 09/04/2019 Objective Swallowing Evaluation: Type of Study: MBS-Modified Barium Swallow Study  Patient Details Name: DESJUAN STEARNS MRN: [id number] Date of Birth: 1960-12-26 Today's Date: 09/04/2019 Time: SLP Start Time (ACUTE ONLY): 1320 -SLP Stop Time (ACUTE ONLY): 1332 SLP Time Calculation (min) (ACUTE ONLY): 12 min Past Medical History: Past  Medical History: Diagnosis Date . Asthma  . Chronic respiratory failure with hypoxia ([place]) 07/09/2017 . COPD (chronic obstructive pulmonary disease) ([place])  . Diabetes mellitus without complication ([place])  . Difficult intubation  . Hypertension  . Shortness of breath dyspnea  . Sleep apnea  Past Surgical History: Past Surgical History: Procedure Laterality Date . IR GASTROSTOMY TUBE MOD SED  07/05/2017 . THYROIDECTOMY Left 08/30/2019  Procedure: THYROIDECTOMY CERVICAL LIPECTOMY;  Surgeon: Izora Gala, MD;  Location: [facility];  Service: ENT;  Laterality: Left;  NEEDS RNFA PLEASE . TRACHEOSTOMY TUBE PLACEMENT N/A 06/17/2017  Procedure: TRACHEOSTOMY;  Surgeon: Leta Baptist, MD;  Location: [facility];  Service: ENT;  Laterality: N/A; . TRACHEOSTOMY TUBE PLACEMENT N/A 08/14/2019  Procedure: TRACHEOSTOMY;  Surgeon: Izora Gala, MD;  Location: [facility];  Service: ENT;  Laterality: N/A; . TRACHEOSTOMY TUBE PLACEMENT N/A 08/30/2019  Procedure: TRACHEOSTOMY  REVISION;  Surgeon: Izora Gala, MD;  Location: [facility];  Service: ENT;  Laterality: N/A; HPI: 59 year old male with baseline chronic respiratory failure on the basis of severe OHS/OSA complicated further by significant obstructive disease in the setting of COPD.  Presents with acute on chronic hypoxic and hypercarbic respiratory failure. Has required mechanical ventilation for most of the hospitalization, extubated on 7/16, had stridor, unsuccessful re-intubation, had emergent tracheostomy which was difficult.  On 7/24 he became agitated and his tracheostomy dislodged, required emergent re-intubation with following failed tracheostomy with cuff leak, now s/p tracheostomy revision with total thyroidectomy and cervical lipectomy.  Pt has a hx of a trach and oropharyngeal dysphagia in 2019 with two FEES completed during that admission.  Subjective: Pt was encountered awake/alert with trach collar Assessment / Plan / Recommendation CHL IP CLINICAL IMPRESSIONS 09/04/2019 Clinical Impression Pt  demonstrates adequate airway protection during swallow assessment, only impairment is mild vallecular packing during mastication. PMSV was in place. One instance of flash penetration seen  between swallows, likely from oral residue transited to pharynx. Pt is able to resume a regular diet and thin liquids but must wear PMSV during oral intake. No SLP f/u needed for swallowing.  SLP Visit Diagnosis Dysphagia, unspecified (R13.10) Attention and concentration deficit following -- Frontal lobe and executive function deficit following -- Impact on safety and function Mild aspiration risk   CHL IP TREATMENT RECOMMENDATION 09/04/2019 Treatment Recommendations No treatment recommended at this time   Prognosis 09/02/2019 Prognosis for Safe Diet Advancement Fair Barriers to Reach Goals Severity of deficits Barriers/Prognosis Comment -- CHL IP DIET RECOMMENDATION 09/04/2019 SLP Diet Recommendations Regular solids;Thin liquid Liquid Administration via Cup;Straw Medication Administration Whole meds with liquid Compensations Slow rate;Small sips/bites Postural Changes Seated upright at 90 degrees;Remain semi-upright after after feeds/meals (Comment)   CHL IP OTHER RECOMMENDATIONS 09/04/2019 Recommended Consults -- Oral Care Recommendations -- Other Recommendations Place PMSV during PO intake   CHL IP FOLLOW UP RECOMMENDATIONS 09/02/2019 Follow up Recommendations LTACH;Inpatient Rehab   CHL IP FREQUENCY AND DURATION 09/02/2019 Speech Therapy Frequency (ACUTE ONLY) min 2x/week Treatment Duration 2 weeks      CHL IP ORAL PHASE 09/04/2019 Oral Phase WFL Oral - Pudding Teaspoon -- Oral - Pudding Cup -- Oral - Honey Teaspoon -- Oral - Honey Cup -- Oral - Nectar Teaspoon -- Oral - Nectar Cup -- Oral - Nectar Straw -- Oral - Thin Teaspoon -- Oral - Thin Cup -- Oral - Thin Straw -- Oral - Puree -- Oral - Mech Soft -- Oral - Regular -- Oral - Multi-Consistency -- Oral - Pill -- Oral Phase - Comment --  CHL IP PHARYNGEAL PHASE 09/04/2019 Pharyngeal  Phase WFL Pharyngeal- Pudding Teaspoon -- Pharyngeal -- Pharyngeal- Pudding Cup -- Pharyngeal -- Pharyngeal- Honey Teaspoon -- Pharyngeal -- Pharyngeal- Honey Cup -- Pharyngeal -- Pharyngeal- Nectar Teaspoon -- Pharyngeal -- Pharyngeal- Nectar Cup -- Pharyngeal -- Pharyngeal- Nectar Straw -- Pharyngeal -- Pharyngeal- Thin Teaspoon -- Pharyngeal -- Pharyngeal- Thin Cup -- Pharyngeal -- Pharyngeal- Thin Straw -- Pharyngeal -- Pharyngeal- Puree -- Pharyngeal -- Pharyngeal- Mechanical Soft -- Pharyngeal -- Pharyngeal- Regular -- Pharyngeal -- Pharyngeal- Multi-consistency -- Pharyngeal -- Pharyngeal- Pill -- Pharyngeal -- Pharyngeal Comment --  CHL IP CERVICAL ESOPHAGEAL PHASE 09/04/2019 Cervical Esophageal Phase WFL Pudding Teaspoon -- Pudding Cup -- Honey Teaspoon -- Honey Cup -- Nectar Teaspoon -- Nectar Cup -- Nectar Straw -- Thin Teaspoon -- Thin Cup -- Thin Straw -- Puree -- Mechanical Soft -- Regular -- Multi-consistency -- Pill -- Cervical Esophageal Comment -- Herbie Baltimore, MA CCC-SLP Acute Rehabilitation Services Pager [phone] Office [phone] Lynann Beaver 09/04/2019, 1:59 PM                Time Spent in minutes  30     Desiree Hane M.D on 09/11/2019 at 3:31 PM  To page go to [email or web address] - password [email or web address]

## 2019-09-11 NOTE — Progress Notes (Signed)
8/23-CSW called sister and left vm in reference to patient.

## 2019-09-12 DIAGNOSIS — J9611 Chronic respiratory failure with hypoxia: Secondary | ICD-10-CM

## 2019-09-12 LAB — GLUCOSE, CAPILLARY
Glucose-Capillary: 113 mg/dL — ABNORMAL HIGH (ref 70–99)
Glucose-Capillary: 117 mg/dL — ABNORMAL HIGH (ref 70–99)
Glucose-Capillary: 124 mg/dL — ABNORMAL HIGH (ref 70–99)
Glucose-Capillary: 128 mg/dL — ABNORMAL HIGH (ref 70–99)
Glucose-Capillary: 137 mg/dL — ABNORMAL HIGH (ref 70–99)
Glucose-Capillary: 137 mg/dL — ABNORMAL HIGH (ref 70–99)

## 2019-09-12 NOTE — Progress Notes (Addendum)
[facility]  PROGRESS NOTE  Timothy Le ZOX:[id number] DOB: January 29, 1960 DOA: 07/30/2019 PCP: Kerin Perna, NP Admit date - 07/30/2019   Admitting Physician Jacky Kindle, MD  Outpatient Primary MD for the patient is Kerin Perna, NP  LOS - 20 Brief Narrative   Timothy Le is a 59 y.o. year old male with medical history significant for type 2 diabetes, HTN, COPD on home oxygen, morbid obesity, OSA, history of tracheal stenosis and prior tracheostomy who presented on 07/30/2019 with worsening shortness of breath or being off his CPAP for the past 2 weeks (per family it was broken) was found to have acute on chronic hypoxemic/hypercapnic respiratory failure due to OHS/OSA complicated by large thyroid goiter.  Patient required intubation upon presentation to ED due to minimally responsive, with marked hypoxia and presumptive hypercarbia.  Initial CO2 on arrival 64, pH 7.25 with diffuse pulmonary edema on chest x-ray.     Hospital course complicated by failed extubation related to acute respiratory distress failed attempts of reintubation requiring revision colostomy on 7/16.  7/24 tracheostomy became dislodged in the setting of agitation required emergent reintubation followed by tracheostomy revision with ENT on 7/26.  8/9 old mucous plug resulting in bradycardia and CODE BLUE.  Total thyroidectomy and cervical lipectomy and trach revision with ENT on 8/11.  Patient was transferred to progressive care unit under hospital service on 8/15.  8/18 transfer back to stepdown unit due to desaturation in the setting of mucous plugging and significant bradycardia and attempted chest compressions (but no actual loss of pulse)   Bronchoscopy was performed by PCCM on arrival to stepdown unit on 8/18 with no evidence of obstructive mucus or debris and clear visualization of the carina.    Patient has been monitoredprogressive unit since then and has no repeat episodes of respiratory distress  since 8/18, tolerating 5 L via trach collar with nebulized hypertonic saline as directed by PCCM consultants  Subjective  No acute events overnight.  Feels like he is getting stronger when he ambulates in the room with assistance of walker. Stable cough. No SOB or chest pain  A & P  Acute on chronic hypoxic hypercapnic respiratory failure.  Presumed secondary to obesity hypoventilation syndrome/obstructive sleep apnea as well as further complication due to large thyroid goiter.  Bronchoscopy on 8/18 shows clear airways, patient continues to have recurrent episodes of mucous plugging and near cardiac events.  No respiratory distress since 8/18.  Currently on trach collar with 5 L -Close monitoring in progressive unit -Nebulized 3% saline BID per PCCM recs, continue trach collar, pulmonary toilet, -PCCM will continue to follow-up with weekly, not a candidate for downsizing currently -Schedule Mucinex, as needed DuoNebs  Chronic diastolic CHF, euvolemic on exam -continue torsemide 10 mg daily, no on home metolazone -Holding home Cardura, started on Lopressor 12.5 mg BID during hospital stay  HLD, stable -continue Lipitor  OSA/OHS -Continue Mucinex -Monitor on trach support  Peripheral neuropathy, stable -Continue Neurontin 257m TID, started during this hospitalization  Type 2 diabetes, poorly controlled A1c 14.9 fasting blood sugar adequate and CBGs at goal -Holding home Metformin -Continue Levemir 30 units twice daily,, monitor CBG, sliding scale as needed  GERD, stable  --continue Protonix  Depression, stable  --continue Zoloft  Gout, stable continue allopurinol  Acute metabolic encephalopathy, resolved.  Has had episodes of agitation like related to prolonged hospitalization, hospital-acquired delirium and metabolic dysfunction related to respiratory failure.  MRI brain showed no acute abnormalities. -Started on Seroquel  BID for agitation/delirum during  hospitalization     Family Communication  : None Code Status : Full  Disposition Plan  :  Patient is from home. Anticipated d/c date: 2 to 3 days. Barriers to d/c or necessity for inpatient status:  doing well respiratory wise,medically stable, working on safe disposition. PT rec CIR however patient would want to go to SNF afterwards given not enough assistance at home for trach care, will consult TOC for SNF, not a CIR candidate Consults  : PCCM  Procedures  :    Thyroidectomies cervical lipectomy, tracheostomy revision 8/11  DVT Prophylaxis  :  Lovenox  Lab Results  Component Value Date   PLT 248 09/11/2019    Diet :  Diet Order            DIET SOFT Room service appropriate? Yes; Fluid consistency: Thin; Fluid restriction: 1500 mL Fluid  Diet effective now                  Inpatient Medications Scheduled Meds: . allopurinol  100 mg Oral Daily  . atorvastatin  10 mg Oral Daily  . chlorhexidine gluconate (MEDLINE KIT)  15 mL Mouth Rinse BID  . Chlorhexidine Gluconate Cloth  6 each Topical Daily  . dextromethorphan-guaiFENesin  1 tablet Oral BID  . enoxaparin (LOVENOX) injection  0.5 mg/kg Subcutaneous Q24H  . gabapentin  200 mg Oral TID  . insulin aspart  0-20 Units Subcutaneous Q4H  . insulin detemir  30 Units Subcutaneous Q12H  . mouth rinse  15 mL Mouth Rinse 10 times per day  . melatonin  3 mg Oral QHS  . metoprolol tartrate  12.5 mg Oral BID  . multivitamin with minerals  1 tablet Oral Daily  . nutrition supplement (JUVEN)  1 packet Oral BID BM  . pantoprazole  40 mg Oral QHS  . polyethylene glycol  17 g Per Tube Daily  . Ensure Max Protein  11 oz Oral BID  . QUEtiapine  100 mg Oral BID  . senna-docusate  2 tablet Oral BID  . sertraline  100 mg Oral Daily  . sodium chloride flush  10-40 mL Intracatheter Q12H  . sodium chloride HYPERTONIC  4 mL Nebulization BID  . torsemide  10 mg Oral Daily   Continuous Infusions: . sodium chloride    . sodium chloride  Stopped (08/30/19 1427)   PRN Meds:.Place/Maintain arterial line **AND** sodium chloride, acetaminophen, alum & mag hydroxide-simeth, hydrALAZINE, ipratropium-albuterol, oxyCODONE, sodium chloride flush  Antibiotics  :   Anti-infectives (From admission, onward)   Start     Dose/Rate Route Frequency Ordered Stop   08/13/19 1000  cefTRIAXone (ROCEPHIN) 2 g in sodium chloride 0.9 % 100 mL IVPB  Status:  Discontinued        2 g 200 mL/hr over 30 Minutes Intravenous Every 24 hours 08/13/19 0912 08/15/19 1150   08/12/19 1000  ceFEPIme (MAXIPIME) 2 g in sodium chloride 0.9 % 100 mL IVPB  Status:  Discontinued        2 g 200 mL/hr over 30 Minutes Intravenous Every 8 hours 08/12/19 0939 08/13/19 0912   08/09/19 1700  piperacillin-tazobactam (ZOSYN) IVPB 3.375 g  Status:  Discontinued        3.375 g 12.5 mL/hr over 240 Minutes Intravenous Every 8 hours 08/08/19 1634 08/08/19 1707   08/08/19 1707  piperacillin-tazobactam (ZOSYN) IVPB 3.375 g  Status:  Discontinued        3.375 g 12.5 mL/hr over 240 Minutes Intravenous  Every 8 hours 08/08/19 1707 08/09/19 1128   08/04/19 2200  piperacillin-tazobactam (ZOSYN) IVPB 3.375 g  Status:  Discontinued        3.375 g 12.5 mL/hr over 240 Minutes Intravenous Every 8 hours 08/04/19 1413 08/08/19 1634   08/04/19 1300  vancomycin (VANCOREADY) IVPB 1750 mg/350 mL        1,750 mg 175 mL/hr over 120 Minutes Intravenous  Once 08/04/19 1249 08/04/19 1631   08/01/19 1230  cefTRIAXone (ROCEPHIN) 2 g in sodium chloride 0.9 % 100 mL IVPB  Status:  Discontinued        2 g 200 mL/hr over 30 Minutes Intravenous Every 24 hours 08/01/19 1226 08/04/19 1413   08/01/19 1230  azithromycin (ZITHROMAX) 500 mg in sodium chloride 0.9 % 250 mL IVPB  Status:  Discontinued        500 mg 250 mL/hr over 60 Minutes Intravenous Every 24 hours 08/01/19 1226 08/04/19 1413       Objective   Vitals:   09/12/19 0755 09/12/19 0940 09/12/19 1127 09/12/19 1131  BP:  106/63 107/64   Pulse:  69 77 69 67  Resp: (!) 24  18 (!) 23  Temp:   98.8 F (37.1 C)   TempSrc:   Oral   SpO2: 98%  98% 99%  Weight:      Height:        SpO2: 99 % O2 Flow Rate (L/min): 5 L/min FiO2 (%): 28 %  Wt Readings from Last 3 Encounters:  09/12/19 (!) 145.6 kg  05/24/19 (!) 152 kg  03/03/19 (!) 156.5 kg     Intake/Output Summary (Last 24 hours) at 09/12/2019 1346 Last data filed at 09/12/2019 0911 Gross per 24 hour  Intake --  Output 2200 ml  Net -2200 ml    Physical Exam:     Awake Alert, Oriented X 3, Normal affect, lying in bed No new F.N deficits,  Trach in place with PMV cap on  Normal respiratory effort on 5 L via trach collar, decreased breath sounds throughout, no wheezing RRR,No Gallops,Rubs or new Murmurs,  +ve B.Sounds, Abd Soft, No tenderness, No rebound, guarding or rigidity. No Cyanosis, No new Rash or bruise   Dry skin   I have personally reviewed the following:   Data Reviewed:  CBC Recent Labs  Lab 09/06/19 1655 09/08/19 0840 09/11/19 0515  WBC 7.4 5.2 5.3  HGB 9.1* 9.3* 9.7*  HCT 30.1* 30.7* 32.2*  PLT 151 188 248  MCV 90.1 90.6 90.7  MCH 27.2 27.4 27.3  MCHC 30.2 30.3 30.1  RDW 15.9* 15.9* 15.9*  LYMPHSABS 2.0  --   --   MONOABS 0.3  --   --   EOSABS 0.2  --   --   BASOSABS 0.0  --   --     Chemistries  Recent Labs  Lab 09/06/19 1400 09/06/19 1835 09/07/19 0344 09/08/19 0840 09/11/19 0515  NA 142  --  140 141 143  K 2.5* 3.8 3.6 4.0 3.6  CL 114*  --  98 99 100  CO2 23  --  34* 33* 33*  GLUCOSE 86  --  130* 188* 121*  BUN 11  --  20 25* 25*  CREATININE 0.46*  --  0.88 0.94 0.88  CALCIUM 6.2*  --  8.9 8.9 9.1  MG 1.0* 1.6* 2.0  --   --    ------------------------------------------------------------------------------------------------------------------ No results for input(s): CHOL, HDL, LDLCALC, TRIG, CHOLHDL, LDLDIRECT in the last 72 hours.  Lab Results  Component Value Date   HGBA1C 14.9 (H) 08/03/2019    ------------------------------------------------------------------------------------------------------------------ No results for input(s): TSH, T4TOTAL, T3FREE, THYROIDAB in the last 72 hours.  Invalid input(s): FREET3 ------------------------------------------------------------------------------------------------------------------ No results for input(s): VITAMINB12, FOLATE, FERRITIN, TIBC, IRON, RETICCTPCT in the last 72 hours.  Coagulation profile No results for input(s): INR, PROTIME in the last 168 hours.  No results for input(s): DDIMER in the last 72 hours.  Cardiac Enzymes No results for input(s): CKMB, TROPONINI, MYOGLOBIN in the last 168 hours.  Invalid input(s): CK ------------------------------------------------------------------------------------------------------------------    Component Value Date/Time   BNP 90.9 08/03/2019 0348    Micro Results No results found for this or any previous visit (from the past 240 hour(s)).  Radiology Reports DG CHEST PORT 1 VIEW  Result Date: 09/04/2019 CLINICAL DATA:  Respiratory distress with tracheostomy tube in place. EXAM: PORTABLE CHEST 1 VIEW COMPARISON:  08/28/2019 FINDINGS: The tracheostomy tube tip is above the carina. Right arm PICC line tip is in the cavoatrial junction. There is a feeding tube with tip below the level of the GE junction. Diminished aeration to the left lung base is again noted which may represent atelectasis or airspace disease. IMPRESSION: 1. Satisfactory position of support apparatus. 2. No change in aeration to the left lung base. Electronically Signed   By: Kerby Moors M.D.   On: 09/04/2019 18:43   DG CHEST PORT 1 VIEW  Result Date: 08/28/2019 CLINICAL DATA:  Hypoxia. EXAM: PORTABLE CHEST 1 VIEW COMPARISON:  Single-view of the chest 08/19/2019. FINDINGS: Endotracheal tube and feeding tube remain in place. Left basilar airspace disease seen on the prior examination has improved. The right lung is  clear. Heart size is upper normal. No pneumothorax or pleural effusion. IMPRESSION: ET tube and feeding tube remain in good position. Improved left basilar airspace disease. Electronically Signed   By: Inge Rise M.D.   On: 08/28/2019 13:28   DG Chest Port 1 View  Result Date: 08/19/2019 CLINICAL DATA:  59 year old male with respiratory failure currently intubated EXAM: PORTABLE CHEST 1 VIEW COMPARISON:  Prior chest x-ray 07/19/2019 FINDINGS: The patient remains intubated. The tip of the endotracheal tube is 3.4 cm above the carina. An enteric feeding tube is present but incompletely imaged. The tube passes below the diaphragm and is presumably within the stomach or proximal small bowel. Slightly in proved inspiratory volumes with decreasing right perihilar and right basilar atelectasis. Persistent dense left basilar opacity which remains nonspecific. Background interstitial prominence and bronchitic changes are similar compared to prior. No pneumothorax. IMPRESSION: 1. Slightly improved inspiratory volumes with decreasing right perihilar and right basilar atelectasis. 2. Persistent left greater than right bibasilar airspace opacities which may reflect atelectasis and/or infiltrate. 3. Stable and satisfactory support apparatus. Electronically Signed   By: Jacqulynn Cadet M.D.   On: 08/19/2019 10:25   DG CHEST PORT 1 VIEW  Result Date: 08/18/2019 CLINICAL DATA:  Intubation. EXAM: PORTABLE CHEST 1 VIEW COMPARISON:  Chest x-ray 08/16/2019 FINDINGS: Tracheostomy tube was removed. Endotracheal tube terminates 2 cm above the carina. NG tube courses off the inferior border the film. Heart is enlarged. Bilateral pleural effusions have increased, left greater than right. Bibasilar airspace disease noted. Interstitial edema is present. IMPRESSION: 1. Increasing bilateral pleural effusions, left greater than right. 2. Bibasilar airspace disease likely reflects atelectasis. Infection is not excluded. 3.  Cardiomegaly with interstitial edema. Electronically Signed   By: San Morelle M.D.   On: 08/18/2019 04:20   DG CHEST PORT 1 VIEW  Result Date: 08/16/2019 CLINICAL DATA:  Respiratory arrest EXAM: PORTABLE CHEST 1 VIEW COMPARISON:  Three days ago FINDINGS: A tracheostomy tube has been placed. Feeding tube which reaches the stomach. Right PICC with tip at the upper cavoatrial junction. Artifact from support hardware. Significantly improved aeration with better lung volumes and improved edema. Borderline heart size is stable. IMPRESSION: 1. Unremarkable hardware positioning. 2. Symmetric aeration which is improved from 3 days ago. Electronically Signed   By: Monte Fantasia M.D.   On: 08/16/2019 06:37   DG Abd Portable 1V  Result Date: 08/30/2019 CLINICAL DATA:  Feeding tube placement EXAM: PORTABLE ABDOMEN - 1 VIEW COMPARISON:  08/04/2019 FINDINGS: Nasoenteric feeding tube tip is seen within the gastric antrum. The abdominal gas pattern is nonspecific due to a paucity of intra-abdominal gas. Right flank and pelvis excluded from view. IMPRESSION: Nasoenteric feeding tube tip is within the gastric antrum. Electronically Signed   By: Fidela Salisbury MD   On: 08/30/2019 19:29   DG Swallowing Func-Speech Pathology  Result Date: 09/04/2019 Objective Swallowing Evaluation: Type of Study: MBS-Modified Barium Swallow Study  Patient Details Name: BASSEM BERNASCONI MRN: [id number] Date of Birth: [birth year] Today's Date: 09/04/2019 Time: SLP Start Time (ACUTE ONLY): 1320 -SLP Stop Time (ACUTE ONLY): 1332 SLP Time Calculation (min) (ACUTE ONLY): 12 min Past Medical History: Past Medical History: Diagnosis Date . Asthma  . Chronic respiratory failure with hypoxia ([place]) 07/09/2017 . COPD (chronic obstructive pulmonary disease) ([place])  . Diabetes mellitus without complication ([place])  . Difficult intubation  . Hypertension  . Shortness of breath dyspnea  . Sleep apnea  Past Surgical History: Past Surgical History:  Procedure Laterality Date . IR GASTROSTOMY TUBE MOD SED  07/05/2017 . THYROIDECTOMY Left 08/30/2019  Procedure: THYROIDECTOMY CERVICAL LIPECTOMY;  Surgeon: Izora Gala, MD;  Location: [facility];  Service: ENT;  Laterality: Left;  NEEDS RNFA PLEASE . TRACHEOSTOMY TUBE PLACEMENT N/A 06/17/2017  Procedure: TRACHEOSTOMY;  Surgeon: Leta Baptist, MD;  Location: [facility];  Service: ENT;  Laterality: N/A; . TRACHEOSTOMY TUBE PLACEMENT N/A 08/14/2019  Procedure: TRACHEOSTOMY;  Surgeon: Izora Gala, MD;  Location: [facility];  Service: ENT;  Laterality: N/A; . TRACHEOSTOMY TUBE PLACEMENT N/A 08/30/2019  Procedure: TRACHEOSTOMY  REVISION;  Surgeon: Izora Gala, MD;  Location: [facility];  Service: ENT;  Laterality: N/A; HPI: 59 year old male with baseline chronic respiratory failure on the basis of severe OHS/OSA complicated further by significant obstructive disease in the setting of COPD.  Presents with acute on chronic hypoxic and hypercarbic respiratory failure. Has required mechanical ventilation for most of the hospitalization, extubated on 7/16, had stridor, unsuccessful re-intubation, had emergent tracheostomy which was difficult.  On 7/24 he became agitated and his tracheostomy dislodged, required emergent re-intubation with following failed tracheostomy with cuff leak, now s/p tracheostomy revision with total thyroidectomy and cervical lipectomy.  Pt has a hx of a trach and oropharyngeal dysphagia in 2019 with two FEES completed during that admission.  Subjective: Pt was encountered awake/alert with trach collar Assessment / Plan / Recommendation CHL IP CLINICAL IMPRESSIONS 09/04/2019 Clinical Impression Pt demonstrates adequate airway protection during swallow assessment, only impairment is mild vallecular packing during mastication. PMSV was in place. One instance of flash penetration seen between swallows, likely from oral residue transited to pharynx. Pt is able to resume a regular diet and thin liquids but must wear PMSV during oral  intake. No SLP f/u needed for swallowing.  SLP Visit Diagnosis Dysphagia, unspecified (R13.10) Attention and concentration deficit following -- Frontal lobe  and executive function deficit following -- Impact on safety and function Mild aspiration risk   CHL IP TREATMENT RECOMMENDATION 09/04/2019 Treatment Recommendations No treatment recommended at this time   Prognosis 09/02/2019 Prognosis for Safe Diet Advancement Fair Barriers to Reach Goals Severity of deficits Barriers/Prognosis Comment -- CHL IP DIET RECOMMENDATION 09/04/2019 SLP Diet Recommendations Regular solids;Thin liquid Liquid Administration via Cup;Straw Medication Administration Whole meds with liquid Compensations Slow rate;Small sips/bites Postural Changes Seated upright at 90 degrees;Remain semi-upright after after feeds/meals (Comment)   CHL IP OTHER RECOMMENDATIONS 09/04/2019 Recommended Consults -- Oral Care Recommendations -- Other Recommendations Place PMSV during PO intake   CHL IP FOLLOW UP RECOMMENDATIONS 09/02/2019 Follow up Recommendations LTACH;Inpatient Rehab   CHL IP FREQUENCY AND DURATION 09/02/2019 Speech Therapy Frequency (ACUTE ONLY) min 2x/week Treatment Duration 2 weeks      CHL IP ORAL PHASE 09/04/2019 Oral Phase WFL Oral - Pudding Teaspoon -- Oral - Pudding Cup -- Oral - Honey Teaspoon -- Oral - Honey Cup -- Oral - Nectar Teaspoon -- Oral - Nectar Cup -- Oral - Nectar Straw -- Oral - Thin Teaspoon -- Oral - Thin Cup -- Oral - Thin Straw -- Oral - Puree -- Oral - Mech Soft -- Oral - Regular -- Oral - Multi-Consistency -- Oral - Pill -- Oral Phase - Comment --  CHL IP PHARYNGEAL PHASE 09/04/2019 Pharyngeal Phase WFL Pharyngeal- Pudding Teaspoon -- Pharyngeal -- Pharyngeal- Pudding Cup -- Pharyngeal -- Pharyngeal- Honey Teaspoon -- Pharyngeal -- Pharyngeal- Honey Cup -- Pharyngeal -- Pharyngeal- Nectar Teaspoon -- Pharyngeal -- Pharyngeal- Nectar Cup -- Pharyngeal -- Pharyngeal- Nectar Straw -- Pharyngeal -- Pharyngeal- Thin Teaspoon --  Pharyngeal -- Pharyngeal- Thin Cup -- Pharyngeal -- Pharyngeal- Thin Straw -- Pharyngeal -- Pharyngeal- Puree -- Pharyngeal -- Pharyngeal- Mechanical Soft -- Pharyngeal -- Pharyngeal- Regular -- Pharyngeal -- Pharyngeal- Multi-consistency -- Pharyngeal -- Pharyngeal- Pill -- Pharyngeal -- Pharyngeal Comment --  CHL IP CERVICAL ESOPHAGEAL PHASE 09/04/2019 Cervical Esophageal Phase WFL Pudding Teaspoon -- Pudding Cup -- Honey Teaspoon -- Honey Cup -- Nectar Teaspoon -- Nectar Cup -- Nectar Straw -- Thin Teaspoon -- Thin Cup -- Thin Straw -- Puree -- Mechanical Soft -- Regular -- Multi-consistency -- Pill -- Cervical Esophageal Comment -- Herbie Baltimore, MA CCC-SLP Acute Rehabilitation Services Pager [phone] Office [phone] Lynann Beaver 09/04/2019, 1:59 PM                Time Spent in minutes  30     Desiree Hane M.D on 09/12/2019 at 1:46 PM  To page go to [email or web address] - password [facility]

## 2019-09-12 NOTE — Progress Notes (Signed)
Physical Therapy Treatment Patient Details Name: Timothy Le MRN: [id number] DOB: May 25, 1960 Today's Date: 09/12/2019    History of Present Illness Pt is 59 year old African-American male with PMH significant for DM 2, HTN, COPD on home oxygen, morbid obesity, obstructive sleep apnea. Patient presented to the ED on 07/30/2019 with worsening shortness of breath. Initial work-up suggested pulmonary edema and decompensated diastolic heart failure. He was intubated in the ED and admitted to ICU.  He required mechanical ventilation for most of the hospitalization. On 8/11 he underwent total thyroidectomy and cervical lipectomy with suturing of skin flaps to to trachea for tracheostomy. On 09/05/19 pt tx to ICU due to mucus plug.    PT Comments    Pt making steady, slow progress with mobility. Remains very high fall risk due to his LE instability and his impulsiveness. CIR not an option so recommend SNF. Do not feel pt can return home at this time.    Follow Up Recommendations  SNF (CIR denied due to lack of home support)     Equipment Recommendations  Other (comment) (TBD)    Recommendations for Other Services       Precautions / Restrictions Precautions Precautions: Fall Precaution Comments: trach collar, PMV    Mobility  Bed Mobility Overal bed mobility: Needs Assistance Bed Mobility: Supine to Sit     Supine to sit: Min assist     General bed mobility comments: Assist to elevate trunk into sitting  Transfers Overall transfer level: Needs assistance Equipment used: Rolling walker (2 wheeled) Transfers: Sit to/from Stand Sit to Stand: Mod assist;+2 physical assistance         General transfer comment: Assist to bring hips up and for stability.   Ambulation/Gait Ambulation/Gait assistance: Mod assist;+2 safety/equipment Gait Distance (Feet): 10 Feet (10' x 1, 6' x 1) Assistive device: Rolling walker (2 wheeled) Gait Pattern/deviations: Step-to pattern;Wide base of  support;Trunk flexed Gait velocity: decr Gait velocity interpretation: <1.8 ft/sec, indicate of risk for recurrent falls General Gait Details: LE's very unstable and fatigue quickly. Multiple verbal cues to not get to step past walker. Pt fatigues quickly and decr awareness of need to sit prior to knees buckling.    Stairs             Wheelchair Mobility    Modified Rankin (Stroke Patients Only)       Balance Overall balance assessment: Needs assistance Sitting-balance support: No upper extremity supported;Feet supported Sitting balance-Leahy Scale: Good     Standing balance support: During functional activity Standing balance-Leahy Scale: Poor Standing balance comment: walker and min assist for static standing                            Cognition Arousal/Alertness: Awake/alert Behavior During Therapy: Impulsive Overall Cognitive Status: Impaired/Different from baseline Area of Impairment: Safety/judgement                         Safety/Judgement: Decreased awareness of safety;Decreased awareness of deficits            Exercises      General Comments General comments (skin integrity, edema, etc.): VSS on 28% trach collar      Pertinent Vitals/Pain Pain Assessment: No/denies pain    Home Living                      Prior Function  PT Goals (current goals can now be found in the care plan section) Progress towards PT goals: Progressing toward goals    Frequency    Min 3X/week      PT Plan Discharge plan needs to be updated    Co-evaluation              AM-PAC PT "6 Clicks" Mobility   Outcome Measure  Help needed turning from your back to your side while in a flat bed without using bedrails?: A Little Help needed moving from lying on your back to sitting on the side of a flat bed without using bedrails?: A Little Help needed moving to and from a bed to a chair (including a wheelchair)?: A  Lot Help needed standing up from a chair using your arms (e.g., wheelchair or bedside chair)?: A Lot Help needed to walk in hospital room?: A Lot Help needed climbing 3-5 steps with a railing? : Total 6 Click Score: 13    End of Session Equipment Utilized During Treatment: Gait belt;Oxygen Activity Tolerance: Patient tolerated treatment well;Patient limited by fatigue Patient left: in chair;with call bell/phone within reach;with chair alarm set Nurse Communication: Mobility status (nurse tech) PT Visit Diagnosis: Unsteadiness on feet (R26.81);Other abnormalities of gait and mobility (R26.89)     Time: 6222-9798 PT Time Calculation (min) (ACUTE ONLY): 28 min  Charges:  $Gait Training: 23-37 mins                     [place] Pager [phone] Office [place] 09/12/2019, 3:52 PM

## 2019-09-12 NOTE — Progress Notes (Signed)
Inpatient Rehabilitation Admissions Coordinator  Inpatient rehab consult received. I met with patient at bedside for rehab assessment and spoke with his sister, [name] Water engineer) by phone. Patient previously at [facility] 07/2017 after first trach. Discharged to [place] for 3 to 4 months for he was uncomfortable with trach at home initially and family unable to assist daily. He is with same situation now. I recommended SNF to [name] and patient. They are in agreement. They prefer not to return to [facility]. I have alerted Dr. Lonny Prude, acute team and TOC. We will sign off at this time.  Danne Baxter, RN, MSN Rehab Admissions Coordinator [phone] 09/12/2019 2:20 PM

## 2019-09-12 NOTE — Progress Notes (Signed)
Nutrition Follow-up  DOCUMENTATION CODES:   Morbid obesity  INTERVENTION:   - Continue  Ensure Max po BID, each supplement provides 150 kcal and 30 grams of protein  - Continue 1 packet Juven BID, each packet provides 95 calories, 2.5 grams of protein, and 9.8 grams of carbohydrate; also contains 7 grams of L-arginine and L-glutamine, 300 mg vitamin C, 15 mg vitamin E, 1.2 mcg vitamin B-12, 9.5 mg zinc, 200 mg calcium, and 1.5 g calcium Beta-hydroxy-Beta-methylbutyrate to support wound healing  - Continue MVI with minerals daily  NUTRITION DIAGNOSIS:   Inadequate oral intake related to inability to eat as evidenced by NPO status.  Progressing, pt now on soft diet  GOAL:   Patient will meet greater than or equal to 90% of their needs  Progressing  MONITOR:   PO intake, Supplement acceptance, Labs, Weight trends, Skin, I & O's  REASON FOR ASSESSMENT:   Consult Enteral/tube feeding initiation and management  ASSESSMENT:   Pt with baseline chronic respiratory failure on the basis of severe OHS/OSA complicated further by significant obstructive disease in the setting of COPD.  Presents with acute on chronic hypoxic and hypercarbic respiratory failure.  Admitted with the initial working diagnosis of pulmonary edema and decompensated diastolic heart failure. PMH includes HTN, COPD, DM.  7/11 - intubated 7/16- extubated but developed stridor, attempted re-intubation unsuccessful, emergenttrach placed 7/21 - Cortrak placed 7/23 - s/p bronch, mucous plug 7/24 - trach dislodged, emergently re-intubated 7/26 - ENT replaced trach in OR 7/30 - significant trach cuff leak, required oral intubation 8/09 - bronch mucous plug resulting in bradycardia and code blue 8/11 - s/p tracheostomy revision with total thyroidectomy and cervical lipectomy 8/13 - trach collar 8/14 - neck drains removed 8/16 - MBS, diet advanced to soft with thin liquids 8/17 - TF d/c 8/19 - Cortrak  removed  Pt on a soft diet with 1500 ml fluid restriction.  Spoke with pt at bedside. Assisted pt in getting set up for lunch meal tray. Pt very appreciative and reports that he loves the food. Pt eating well during RD visit. Pt reports taking the Juven and Ensure Max supplements and tolerating well. Will continue with current regimen as well as MVI with minerals.  Meal Completion: 100% x last 7 documented meals  Medications reviewed and include: SSI q 4 hours, Levemir 30 units q 12 hours, MVI with minerals, Juven, protonix, miralax, Ensure Max BID, senna, torsemide  Labs reviewed: hemoglobin 9.7 CBG's: 113-224 x 24 hours  Diet Order:   Diet Order            DIET SOFT Room service appropriate? Yes; Fluid consistency: Thin; Fluid restriction: 1500 mL Fluid  Diet effective now                 EDUCATION NEEDS:   No education needs have been identified at this time  Skin:  Skin Assessment: Skin Integrity Issues: Stage II: sacrum Stage III: neck Incisions: neck x 2  Last BM:  09/10/19 large type 2  Height:   Ht Readings from Last 1 Encounters:  07/30/19 6' (1.829 m)    Weight:   Wt Readings from Last 1 Encounters:  09/12/19 (!) 145.6 kg    Ideal Body Weight:  80.9 kg  BMI:  Body mass index is 43.54 kg/m.  Estimated Nutritional Needs:   Kcal:  2300-2500  Protein:  120-140 grams  Fluid:  1.8 L    Gaynell Face, MS, RD, LDN Inpatient Clinical Dietitian  Please see AMiON for contact information.

## 2019-09-12 NOTE — TOC Progression Note (Signed)
Transition of Care [facility]) - Progression Note    Patient Details  Name: Timothy Le MRN: [id number] Date of Birth: [birth year]  Transition [facility]) CM/SW Contact  Timothy Mayo, RN Phone Number: 09/12/2019, 3:59 PM  Clinical Narrative:    NCM spoke with patient, he states he does not want to give his check up to go to snf because he does not want to loose his apartment, he has come too far to do that and he does not want to go to the [facility], he states this will be his last option.  He states he did not have to  Give up his check when he was there.  NCM informed him also we checked into [facility] program for hh, but they state he must have a primary care giver that lives with him to help him or this would be unsafe for them and they could not do that.  Patient informed NCM to contact his sister Timothy Le , he states she is his POA, but we do not have POA paper work for that.  He states if she says for him to go back to the [facility] then he will go, and if she says for him not to go back then he will not.  NCM left vm for sister, Timothy Le to return call to discuss dispo with her.        Expected Discharge Plan and Services                                                 Social Determinants of Health (SDOH) Interventions    Readmission Risk Interventions No flowsheet data found.

## 2019-09-13 LAB — GLUCOSE, CAPILLARY
Glucose-Capillary: 105 mg/dL — ABNORMAL HIGH (ref 70–99)
Glucose-Capillary: 107 mg/dL — ABNORMAL HIGH (ref 70–99)
Glucose-Capillary: 118 mg/dL — ABNORMAL HIGH (ref 70–99)
Glucose-Capillary: 120 mg/dL — ABNORMAL HIGH (ref 70–99)
Glucose-Capillary: 137 mg/dL — ABNORMAL HIGH (ref 70–99)
Glucose-Capillary: 142 mg/dL — ABNORMAL HIGH (ref 70–99)
Glucose-Capillary: 91 mg/dL (ref 70–99)

## 2019-09-13 MED ORDER — IPRATROPIUM-ALBUTEROL 0.5-2.5 (3) MG/3ML IN SOLN
RESPIRATORY_TRACT | Status: AC
  Filled 2019-09-13: qty 3

## 2019-09-13 NOTE — TOC Progression Note (Addendum)
Transition of Care [facility]) - Progression Note    Patient Details  Name: Timothy Le MRN: [id number] Date of Birth: 1960/06/11  Transition of Care [facility]) CM/SW Contact  Zenon Mayo, RN Phone Number: 09/13/2019, 3:58 PM  Clinical Narrative:    NCM contacted patient sister, Timothy Le, she states she can not talk to me on the phone because she is at work, so it is no need for anyone to call her between 8 and 6 unless it is a dying emergency.   16:20 NCM received call back from sister stating so what is it that you want to talk about.  NCM informed her that patient does not want to give up his check to go to a facility because [place] [address] and they will take his place from him and he has come too far now.  Sister states she can not pay that for him.  NCM asked if she could hire private duty nursing for him at home.  She states she will look into it and call this NCM back tomorrow.  NCM told her to go to Medicare .gov to get the list for private duty.        Expected Discharge Plan and Services                                                 Social Determinants of Health (SDOH) Interventions    Readmission Risk Interventions No flowsheet data found.

## 2019-09-13 NOTE — Plan of Care (Signed)
  Problem: Education: Goal: Knowledge of General Education information will improve Description: Including pain rating scale, medication(s)/side effects and non-pharmacologic comfort measures Outcome: Progressing   Problem: Health Behavior/Discharge Planning: Goal: Ability to manage health-related needs will improve Outcome: Progressing   Problem: Clinical Measurements: Goal: Ability to maintain clinical measurements within normal limits will improve Outcome: Progressing Goal: Will remain free from infection Outcome: Progressing Goal: Diagnostic test results will improve Outcome: Progressing Goal: Respiratory complications will improve Outcome: Progressing Goal: Cardiovascular complication will be avoided Outcome: Progressing   Problem: Activity: Goal: Risk for activity intolerance will decrease Outcome: Progressing   Problem: Nutrition: Goal: Adequate nutrition will be maintained Outcome: Progressing   Problem: Coping: Goal: Level of anxiety will decrease Outcome: Progressing   Problem: Elimination: Goal: Will not experience complications related to bowel motility Outcome: Progressing Goal: Will not experience complications related to urinary retention Outcome: Progressing   Problem: Pain Managment: Goal: General experience of comfort will improve Outcome: Progressing   Problem: Safety: Goal: Ability to remain free from injury will improve Outcome: Progressing   Problem: Skin Integrity: Goal: Risk for impaired skin integrity will decrease Outcome: Progressing   Problem: Activity: Goal: Ability to tolerate increased activity will improve Outcome: Progressing   Problem: Respiratory: Goal: Ability to maintain a clear airway and adequate ventilation will improve Outcome: Progressing   Problem: Role Relationship: Goal: Method of communication will improve Outcome: Progressing   Problem: Activity: Goal: Ability to tolerate increased activity will  improve Outcome: Progressing   Problem: Respiratory: Goal: Ability to maintain a clear airway and adequate ventilation will improve Outcome: Progressing   Problem: Role Relationship: Goal: Method of communication will improve Outcome: Progressing   

## 2019-09-13 NOTE — Progress Notes (Signed)
[facility] PROGRESS NOTE  Timothy Le ZWC:[id number] DOB: [birth year] DOA: 07/30/2019 PCP: Kerin Perna, NP  Status: Inpatient--Remains inpatient appropriate because:Unsafe d/c plan, Inpatient level of care appropriate due to severity of illness and Initial require close supervision of respiratory status in context of recurrent episodes of mucous plugging and near cardiac/respiratory arrest events   Dispo: The patient is from: Home              Anticipated d/c is to: SNF              Anticipated d/c date is: > 3 days              Patient currently is not medically stable to d/c. Needs SAFE DC PLAN-SNF planned-pt wants to return to apartment but can't reside independently 2/2 tracheostomy-unclear if family can provide 24/7 care indefinitely  Code Status: Full Family Communication: Patient only DVT prophylaxis: Lovenox Vaccination status: Not vaccinated for Covid  HPI:  Timothy Le is a 59 y.o. year old male with medical history significant for type 2 diabetes, HTN, COPD on home oxygen, morbid obesity, OSA, history of tracheal stenosis and prior tracheostomy who presented on 07/30/2019 with worsening shortness of breath or being off his CPAP for the past 2 weeks (per family it was broken) was found to have acute on chronic hypoxemic/hypercapnic respiratory failure due to OHS/OSA complicated by large thyroid goiter.  Patient required intubation upon presentation to ED due to minimally responsive, with marked hypoxia and presumptive hypercarbia.  Initial CO2 on arrival 64, pH 7.25 with diffuse pulmonary edema on chest x-ray.    Hospital course complicated by failed extubation related to acute respiratory distress failed attempts of reintubation requiring revision tracheostomy on 7/16.  7/24 tracheostomy became dislodged in the setting of agitation required emergent reintubation followed by tracheostomy revision with ENT on 7/26.  8/9 mucous plug resulting in bradycardia and  CODE BLUE.  Total thyroidectomy and cervical lipectomy and trach revision with ENT on 8/11.  Patient was transferred to progressive care unit under hospital service on 8/15.  8/18 transfer back to stepdown unit due to desaturation in the setting of mucous plugging and significant bradycardia and attempted chest compressions (but no actual loss of pulse)   Bronchoscopy was performed by PCCM on arrival to stepdown unit on 8/18 with no evidence of obstructive mucus or debris and clear visualization of the carina.    Patient has been monitoredprogressive unit since then and has no repeat episodes of respiratory distress since 8/18, tolerating 5 L/FIO2 28% via trach collar with nebulized hypertonic saline as directed by PCCM consultants.  Given substantial care requirements and need for chronic trach recommendation is for patient to initially transition to SNF unless family can provide 24/7 care.  Subjective: Denies shortness of breath, chest discomfort, excessive secretions or coughing. Discussed with patient recent mobility.  He reports he has been out of bed and walked with assistance to the doorway but became extremely short of breath.  Objective: Vitals:   09/13/19 0425 09/13/19 0806  BP:    Pulse: 82 74  Resp: (!) 24 20  Temp:    SpO2: 96%     Intake/Output Summary (Last 24 hours) at 09/13/2019 1103 Last data filed at 09/13/2019 0418 Gross per 24 hour  Intake --  Output 950 ml  Net -950 ml   Filed Weights   09/09/19 0346 09/10/19 0453 09/12/19 0345  Weight: (!) 147 kg (!) 147 kg (!) 145.6 kg  Exam:  Constitutional: NAD, calm, comfortable Neck: Midline tracheostomy patent-PMV in place and patient able to vocalize Respiratory: coarse to auscultation bilaterally, no wheezing, no crackles. Normal respiratory effort. No accessory muscle use.  FiO2 28% Cardiovascular: Regular rate and rhythm, no murmurs / rubs / gallops. No extremity edema. 2+ pedal pulses.  Abdomen: no tenderness,  no masses palpated.  Obese. Bowel sounds positive.  Tolerating solid diet Musculoskeletal: no clubbing / cyanosis. No joint deformity upper and lower extremities. Good ROM, no contractures. Normal muscle tone.  Skin: no rashes, lesions, ulcers. No induration Neurologic: CN 2-12 grossly intact. Sensation intact, DTR normal. Strength 5/5 x all 4 extremities.  Psychiatric: Normal judgment and insight. Alert and oriented x 3. Normal mood.    Assessment/Plan: Acute on chronic hypoxic hypercapnic respiratory failure.   -Presumed 2/2 OHS/OSA as well as further complication due to large thyroid goiter.   -Bronchoscopy on 8/18: clear airways, patient continues to have recurrent episodes of mucous plugging and near cardiac events.  No respiratory distress since 8/18.  Currently on trach collar with 5 L -Close monitoring in progressive unit -PCCM will continue to follow-up with weekly, not a candidate for downsizing currently and likely tracheostomy will be permanent in context of known stenosis and underlying OHS/OSA -Continue schedulD Mucinex, as needed DuoNebs, nebulized 3% saline twice daily and aggressive pulmonary toileting  Chronic diastolic CHF -euvolemic on exam -continue torsemide 10 mg daily, noT on home metolazone -Continue to hold home Cardura; started on Lopressor 12.5 mg BID during hospital stay  Profound physical deconditioning -Reports significant shortness of breath with ambulatory effort to doorway with assistance. -From 8/24 documents patient making steady slow progress with mobility.  He remains a very high fall risk due to his lower extremity instability and impulsiveness. SNF recommended.  Quires rolling walker.  Was able to walk 10 feet.  Fatigues quickly required multiple verbal cues to not step past the walker.  Also has decreased awareness of need to sit prior to knees buckling.  HLD -continue Lipitor  OSA/OHS -As above  Peripheral neuropathy -Currently not  reporting any significant pain -Continue Neurontin 262m TID, started during this hospitalization  Type 2 diabetes, poorly controlled insulin-dependent -HgbA1c14.9  -fasting blood sugar adequate and CBGs at goal -Continue to hold home Metformin -Continue Levemir 30 units twice daily and sliding scale insulin  GERD, stable  --continue Protonix  Depression -No symptoms -continue Zoloft  Gout -Symptomatic -continue allopurinol  Acute metabolic encephalopathy, resolved.   -Has had episodes of agitation likely related to prolonged hospitalization, hospital-acquired delirium and metabolic dysfunction related to respiratory failure.   -MRI brain showed no acute abnormalities. -Started on Seroquel BID for agitation/delirum during hospitalization    Data Reviewed: Basic Metabolic Panel: Recent Labs  Lab 09/06/19 1400 09/06/19 1835 09/07/19 0344 09/08/19 0840 09/11/19 0515  NA 142  --  140 141 143  K 2.5* 3.8 3.6 4.0 3.6  CL 114*  --  98 99 100  CO2 23  --  34* 33* 33*  GLUCOSE 86  --  130* 188* 121*  BUN 11  --  20 25* 25*  CREATININE 0.46*  --  0.88 0.94 0.88  CALCIUM 6.2*  --  8.9 8.9 9.1  MG 1.0* 1.6* 2.0  --   --    Liver Function Tests: No results for input(s): AST, ALT, ALKPHOS, BILITOT, PROT, ALBUMIN in the last 168 hours. No results for input(s): LIPASE, AMYLASE in the last 168 hours. No results for input(s): AMMONIA in  the last 168 hours. CBC: Recent Labs  Lab 09/06/19 1655 09/08/19 0840 09/11/19 0515  WBC 7.4 5.2 5.3  NEUTROABS 4.9  --   --   HGB 9.1* 9.3* 9.7*  HCT 30.1* 30.7* 32.2*  MCV 90.1 90.6 90.7  PLT 151 188 248   Cardiac Enzymes: No results for input(s): CKTOTAL, CKMB, CKMBINDEX, TROPONINI in the last 168 hours. BNP (last 3 results) Recent Labs    08/01/19 0528 08/02/19 0355 08/03/19 0348  BNP 85.2 98.8 90.9    ProBNP (last 3 results) No results for input(s): PROBNP in the last 8760 hours.  CBG: Recent Labs  Lab  09/12/19 1516 09/12/19 1952 09/13/19 0014 09/13/19 0413 09/13/19 0825  GLUCAP 137* 128* 107* 105* 118*    No results found for this or any previous visit (from the past 240 hour(s)).   Studies: No results found.  Scheduled Meds: . allopurinol  100 mg Oral Daily  . atorvastatin  10 mg Oral Daily  . chlorhexidine gluconate (MEDLINE KIT)  15 mL Mouth Rinse BID  . Chlorhexidine Gluconate Cloth  6 each Topical Daily  . dextromethorphan-guaiFENesin  1 tablet Oral BID  . enoxaparin (LOVENOX) injection  0.5 mg/kg Subcutaneous Q24H  . gabapentin  200 mg Oral TID  . insulin aspart  0-20 Units Subcutaneous Q4H  . insulin detemir  30 Units Subcutaneous Q12H  . melatonin  3 mg Oral QHS  . metoprolol tartrate  12.5 mg Oral BID  . multivitamin with minerals  1 tablet Oral Daily  . nutrition supplement (JUVEN)  1 packet Oral BID BM  . pantoprazole  40 mg Oral QHS  . polyethylene glycol  17 g Per Tube Daily  . Ensure Max Protein  11 oz Oral BID  . QUEtiapine  100 mg Oral BID  . senna-docusate  2 tablet Oral BID  . sertraline  100 mg Oral Daily  . sodium chloride flush  10-40 mL Intracatheter Q12H  . sodium chloride HYPERTONIC  4 mL Nebulization BID  . torsemide  10 mg Oral Daily   Continuous Infusions: . sodium chloride    . sodium chloride Stopped (08/30/19 1427)    Active Problems:   OSA (obstructive sleep apnea)   COPD mixed type (HCC)   Obesity hypoventilation syndrome (HCC)   Decubitus ulcer   Acute pulmonary edema (HCC)   Chronic respiratory failure with hypoxia (HCC)   Acute on chronic respiratory failure (HCC)   Acute respiratory failure (HCC)   Endotracheal tube present   Acute airway obstruction   Poorly controlled diabetes mellitus (HCC)   Chronic diastolic CHF (congestive heart failure) ([place])   Consultants: ENT PCCM  Procedures:  Central venous catheter 7/11  Emergent t tracheostomy #6 [place] on 7/16  Bronchoscopy 7/16  Tracheoscopy via trach  7/16  Bronchoscopy 7/23  Emergent endotracheal intubation with 7.5 ET tube after unintended loss of tracheostomy tube on 7/24       Operative tracheostomy on 7/26        Emergent intubation after loss of tracheostomy tube on 7/30       Operative tracheostomy tube, thyroidectomy, cervical lipectomy and tracheostomy revision on 8/11         Antibiotics: Anti-infectives (From admission, onward)   Start     Dose/Rate Route Frequency Ordered Stop   08/13/19 1000  cefTRIAXone (ROCEPHIN) 2 g in sodium chloride 0.9 % 100 mL IVPB  Status:  Discontinued        2 g 200 mL/hr over 30 Minutes  Intravenous Every 24 hours 08/13/19 0912 08/15/19 1150   08/12/19 1000  ceFEPIme (MAXIPIME) 2 g in sodium chloride 0.9 % 100 mL IVPB  Status:  Discontinued        2 g 200 mL/hr over 30 Minutes Intravenous Every 8 hours 08/12/19 0939 08/13/19 0912   08/09/19 1700  piperacillin-tazobactam (ZOSYN) IVPB 3.375 g  Status:  Discontinued        3.375 g 12.5 mL/hr over 240 Minutes Intravenous Every 8 hours 08/08/19 1634 08/08/19 1707   08/08/19 1707  piperacillin-tazobactam (ZOSYN) IVPB 3.375 g  Status:  Discontinued        3.375 g 12.5 mL/hr over 240 Minutes Intravenous Every 8 hours 08/08/19 1707 08/09/19 1128   08/04/19 2200  piperacillin-tazobactam (ZOSYN) IVPB 3.375 g  Status:  Discontinued        3.375 g 12.5 mL/hr over 240 Minutes Intravenous Every 8 hours 08/04/19 1413 08/08/19 1634   08/04/19 1300  vancomycin (VANCOREADY) IVPB 1750 mg/350 mL        1,750 mg 175 mL/hr over 120 Minutes Intravenous  Once 08/04/19 1249 08/04/19 1631   08/01/19 1230  cefTRIAXone (ROCEPHIN) 2 g in sodium chloride 0.9 % 100 mL IVPB  Status:  Discontinued        2 g 200 mL/hr over 30 Minutes Intravenous Every 24 hours 08/01/19 1226 08/04/19 1413   08/01/19 1230  azithromycin (ZITHROMAX) 500 mg in sodium chloride 0.9 % 250 mL IVPB  Status:  Discontinued        500 mg 250 mL/hr over 60 Minutes Intravenous Every 24 hours 08/01/19  1226 08/04/19 1413        Time spent: 20 minutes    Erin Hearing ANP  [facility] Pager [phone]. If 7PM-7AM, please contact night-coverage at [email or web address] 09/13/2019, 11:03 AM  LOS: 45 days

## 2019-09-14 LAB — GLUCOSE, CAPILLARY
Glucose-Capillary: 135 mg/dL — ABNORMAL HIGH (ref 70–99)
Glucose-Capillary: 103 mg/dL — ABNORMAL HIGH (ref 70–99)
Glucose-Capillary: 166 mg/dL — ABNORMAL HIGH (ref 70–99)
Glucose-Capillary: 103 mg/dL — ABNORMAL HIGH (ref 70–99)
Glucose-Capillary: 177 mg/dL — ABNORMAL HIGH (ref 70–99)

## 2019-09-14 MED ORDER — IPRATROPIUM-ALBUTEROL 0.5-2.5 (3) MG/3ML IN SOLN
RESPIRATORY_TRACT | Status: AC
  Filled 2019-09-14: qty 3

## 2019-09-14 MED ORDER — INSULIN DETEMIR 100 UNIT/ML ~~LOC~~ SOLN
33.0000 [IU] | Freq: Two times a day (BID) | SUBCUTANEOUS | Status: DC
  Administered 2019-09-14 – 2019-09-15 (×2): 33 [IU] via SUBCUTANEOUS
  Filled 2019-09-14 (×4): qty 0.33

## 2019-09-14 MED ORDER — OXYCODONE HCL 5 MG PO TABS
10.0000 mg | ORAL_TABLET | Freq: Four times a day (QID) | ORAL | Status: DC | PRN
  Administered 2019-09-14: 10 mg via ORAL
  Filled 2019-09-14: qty 2

## 2019-09-14 NOTE — Progress Notes (Signed)
Inpatient Rehabilitation Admissions Coordinator  Discussed with therapy, [place] and with [facility] with PCCM his progress. Noted plans for change of trach tomorrow . I will follow up with patient in the morning to see if he feels comfortable with self trach care as before, I can consider [facility] admit prior to return home. Pete to place trach that he has used at home before.  Danne Baxter, RN, MSN Rehab Admissions Coordinator [phone] 09/14/2019 3:36 PM

## 2019-09-14 NOTE — Progress Notes (Signed)
NAME:  Timothy Le, MRN:  [id number], DOB:  24-Oct-1960, LOS: 35 ADMISSION DATE:  07/30/2019, CONSULTATION DATE: 09/14/19 REFERRING MD:  Georgette Shell, MD, CHIEF COMPLAINT:  "can't breathe"  Brief History   Patient is a 59 year old African-American male with PMH significant for DM 2, HTN, COPD on home oxygen, morbid obesity, obstructive sleep apnea. Patient presented to the ED on 07/30/2019 with worsening shortness of breath likely in the setting of acute exacerbation of heart failure subsequently intubated for acute on chronic hypoxemic/hypercarbic respiratory failure due to OHS/OSA found to have large thyroid goiter contributing s/p tracheostomy attempts with subsequent tracheostomy failure s/p thyroidectomy and secure trach 08/30/19 with ENT who is transferred to ICU 09/05/19 after acute episode of desaturation and bradycardia due to mucous plug.    Past Medical History   Past Medical History:  Diagnosis Date  . Asthma   . Chronic respiratory failure with hypoxia ([place]) 07/09/2017  . COPD (chronic obstructive pulmonary disease) ([place])   . Diabetes mellitus without complication ([place])   . Difficult intubation   . Hypertension   . Shortness of breath dyspnea   . Sleep apnea     Significant Hospital Events   7/16> he was extubated, had stridor, unsuccessful re-intubation, had emergent tracheostomy which was difficult. 7/23> underwent bronchoscopy because of mucous plug. 7/24> tracheostomy dislodged due to severe agitation; required emergent reintubation 7/26> tracheostomy revision with ENT 7/30> ETT due to cuff leak 8/09> bronch mucus plug resulting in bradycardia and code blue 8/11>total thyroidectomy and cervical lipectomy with suturing of skin flaps to to trachea for tracheostomy 8/14> neck drains removed by ENT.  Transferred out of ICU to hospitalist service on trach collar and Dobbhoff tube 8/16> modified barium swallow -patient showed adequate airway protection.  Switch  from Cortrak tube feeding to regular consistency diet. 8/18>> mucous plug, transferred to ICU  Consults:  pccm  Procedures:  PICC Thyroidectomy  Significant Diagnostic Tests:  As per EMR  Micro Data:  As per EMR  Antimicrobials:  n/a  Interim history/subjective:    Objective   Blood pressure 129/84, pulse 81, temperature 98.7 F (37.1 C), temperature source Oral, resp. rate (Abnormal) 24, height 6' (1.829 m), weight (Abnormal) 145.6 kg, SpO2 95 %.    FiO2 (%):  [28 %] 28 %   Intake/Output Summary (Last 24 hours) at 09/14/2019 1309 Last data filed at 09/14/2019 0313 Gross per 24 hour  Intake 10 ml  Output 975 ml  Net -965 ml   Filed Weights   09/09/19 0346 09/10/19 0453 09/12/19 0345  Weight: (Abnormal) 147 kg (Abnormal) 147 kg (Abnormal) 145.6 kg    Examination: General: 59 year old male patient well-known to me resting comfortably in bed able to phonate with adjustable cuffed tracheostomy in place with cuff deflated phonation quality is excellent HEENT size 6Cuffed adjustable trach in place tolerating well. Pulmonary: Clear to auscultation Cardiac: Regular rate and rhythm Abdomen: Soft nontender Extremities: Dependent edema Neuro: Awake oriented no focal deficits appreciated.  Resolved Hospital Problem list     Assessment & Plan:  1) Acute on chronic hypercarbic and hypoxemic respiratory failure: Felt to be due to OHS/OSA with more recent contribution to narrowed airway from large thyroid goiter.   2 ) 8/18 due to mucous plug that was resolved with inline suctioning. 3) Chronic diastolic heart failure: Appears euvolemic at this time 4) Diabetes  Discussion Icholas is doing well.  He is near being ready for discharge to home once medically optimized.  He is done  very well with tracheostomy in the past, and had mastered management of the proximal XLT in the past.  I have no reason to think he will be able to do this again.  He currently has a size 6 adjustable  length tracheostomy in place.  This is cuffed.  Tracheostomy with the internal  He would like the cannula, looking at the measurements here I suspect he will need the size 5 proximal XLT cuffless.  I think this is a reasonable transition specifically as the adjustable tracheostomy is typically a little more difficult to manage in the outpatient setting Plan Continue routine tracheostomy care I will order a size 5 cuffless proximal XLT for tomorrow and plan on changing that at the bedside He has managed this in the outpatient setting in the past and he can do so again, once he is ready for discharge I can continue to follow him from a tracheostomy standpoint in the outpatient setting     Best practice:  Per primary    Erick Colace ACNP-BC [place] Pager # [phone] OR # [phone] if no answer

## 2019-09-14 NOTE — Progress Notes (Addendum)
[facility] PROGRESS NOTE  RUDRA HOBBINS PRX:[id number] DOB: [birth year] DOA: 07/30/2019 PCP: Kerin Perna, NP  Status: Inpatient--Remains inpatient appropriate because:Unsafe d/c plan, Inpatient level of care appropriate due to severity of illness and Initial require close supervision of respiratory status in context of recurrent episodes of mucous plugging and near cardiac/respiratory arrest events   Dispo: The patient is from: Home              Anticipated d/c is to: Home-PT/OT recommend CIR patient does not have 24/7 care after discharge therefore would be unable to pursue this discharge option.              Anticipated d/c date is: > 3 days              Patient currently is not medically stable to d/c. Needs SAFE DC PLAN-SNF planned-pt wants to return to apartment but can't reside independently 2/2 tracheostomy-unclear if family can provide 24/7 care indefinitely  **Discussed with Marni Griffon ACNP with PCCM.  As previously documented patient has been successful managing her tracheostomy at home in the past and Mr. Kary Kos confirms this.  Once patient is transitioned to cuffless trach he will be appropriate for discharge home without 24/7 supervision.  He may also need to undergo an additional trach downsized before being discharged home**  Code Status: Full Family Communication: Patient only DVT prophylaxis: Lovenox Vaccination status: Not vaccinated for Covid  HPI:  Timothy Le is a 59 y.o. year old male with medical history significant for type 2 diabetes, HTN, COPD on home oxygen, morbid obesity, OSA, history of tracheal stenosis and prior tracheostomy who presented on 07/30/2019 with worsening shortness of breath or being off his CPAP for the past 2 weeks (per family it was broken) was found to have acute on chronic hypoxemic/hypercapnic respiratory failure due to OHS/OSA complicated by large thyroid goiter.  Patient required intubation upon presentation to ED due to  minimally responsive, with marked hypoxia and presumptive hypercarbia.  Initial CO2 on arrival 64, pH 7.25 with diffuse pulmonary edema on chest x-ray.    Hospital course complicated by failed extubation related to acute respiratory distress failed attempts of reintubation requiring revision tracheostomy on 7/16.  7/24 tracheostomy became dislodged in the setting of agitation required emergent reintubation followed by tracheostomy revision with ENT on 7/26.  8/9 mucous plug resulting in bradycardia and CODE BLUE.  Total thyroidectomy and cervical lipectomy and trach revision with ENT on 8/11.  Patient was transferred to progressive care unit under hospital service on 8/15.  8/18 transfer back to stepdown unit due to desaturation in the setting of mucous plugging and significant bradycardia and attempted chest compressions (but no actual loss of pulse)   Bronchoscopy was performed by PCCM on arrival to stepdown unit on 8/18 with no evidence of obstructive mucus or debris and clear visualization of the carina.    Patient has been monitored progressive unit since then and has no repeat episodes of respiratory distress since 8/18, tolerating 5 L/FIO2 28% via trach collar with nebulized hypertonic saline as directed by PCCM consultants.  Given substantial care requirements and need for chronic trach recommendation is for patient to initially transition to SNF unless family can provide 24/7 care.  Subjective: No complaints.  Refusing to go to SNF stating he does not wish to lose his apartment.  Please see above regarding my conversation with trach team with tentative plan to discharge patient home with trach without 24/7 care since he is  successfully manage this in the past.  This will be dependent on patient stability, ability to transition to cuffless trach tolerate and possibly will need to tolerate an additional downsizing of the trach.  We will also need to remain stable to ensure no further episodes with  mucus plugging.  Objective: Vitals:   09/14/19 0808 09/14/19 0847  BP: 107/60   Pulse: 99 84  Resp: 20 20  Temp: 98.8 F (37.1 C)   SpO2: 97% 97%    Intake/Output Summary (Last 24 hours) at 09/14/2019 1035 Last data filed at 09/14/2019 2542 Gross per 24 hour  Intake 10 ml  Output 975 ml  Net -965 ml   Filed Weights   09/09/19 0346 09/10/19 0453 09/12/19 0345  Weight: (!) 147 kg (!) 147 kg (!) 145.6 kg    Exam:  Constitutional: NAD, calm, comfortable Neck: Midline tracheostomy patent-PMV in place and patient able to vocalize Respiratory: coarse to auscultation bilaterally, no wheezing, no crackles. Normal respiratory effort. No accessory muscle use.  FiO2 28% Cardiovascular: Regular rate and rhythm, no murmurs / rubs / gallops. No extremity edema. 2+ pedal pulses.  Abdomen: no tenderness, no masses palpated.  Obese. Bowel sounds positive.  Tolerating solid diet Musculoskeletal: no clubbing / cyanosis. No joint deformity upper and lower extremities.  Normal muscle tone.  Skin: no rashes, lesions, ulcers. No induration Neurologic: CN 2-12 grossly intact. Sensation intact, DTR normal. Strength 5/5 x all 4 extremities.  Psychiatric: Normal judgment and insight. Alert and oriented x 3. Normal mood.    Assessment/Plan: Acute on chronic hypoxic hypercapnic respiratory failure.   -Presumed 2/2 OHS/OSA as well as further complication due to large thyroid goiter.   -Bronchoscopy on 8/18: clear airways, patient continues to have recurrent episodes of mucous plugging and near cardiac events.  No respiratory distress since 8/18.  Currently on trach collar with 5 L -Close monitoring in progressive unit -PCCM will continue to follow-up with weekly, not a candidate for downsizing currently and likely tracheostomy will be permanent in context of known stenosis and underlying OHS/OSA -Continue schedulD Mucinex, as needed DuoNebs, nebulized 3% saline twice daily and aggressive pulmonary  toileting -We will need to arrange for home health services including home O2, home health RN, PT and OT  Chronic diastolic CHF -euvolemic on exam -continue torsemide 10 mg daily, noT on home metolazone -Continue to hold home Cardura; started on Lopressor 12.5 mg BID during hospital stay  Profound physical deconditioning -Reports significant shortness of breath with ambulatory effort to doorway with assistance. -From 8/24 documents patient making steady slow progress with mobility.  He remains a very high fall risk due to his lower extremity instability and impulsiveness. SNF recommended.  Quires rolling walker.  Was able to walk 10 feet.  Fatigues quickly required multiple verbal cues to not step past the walker.  Also has decreased awareness of need to sit prior to knees buckling. -Once ready to discharge from a respiratory/trach tube standpoint patient will still need to make significant improvements regarding his physical debility and ambulatory status before can discharge home  HLD -continue Lipitor  OSA/OHS -As above  Peripheral neuropathy -Currently not reporting any significant pain -Continue Neurontin 234m TID, started during this hospitalization  Type 2 diabetes, poorly controlled insulin-dependent -HgbA1c 14.9  -fasting blood sugar adequate and CBGs at goal-as of 8/26 SSI insulin needs have decreased to only 9 units over 24 hours. -Increase Levemir to 33 units twice daily -Patient was on Metformin prior to admission and is  currently on injectable insulin given his massively elevated hemoglobin A1c.  Patient will need to demonstrate ability to self administer insulin prior to discharge. -Preadmission Metformin remains on hold  GERD, stable  --continue Protonix  Depression -No symptoms -continue Zoloft  Gout -Symptomatic -continue allopurinol  Acute metabolic encephalopathy, resolved.   -Has had episodes of agitation likely related to prolonged  hospitalization, hospital-acquired delirium and metabolic dysfunction related to respiratory failure.   -MRI brain showed no acute abnormalities. -Started on Seroquel BID for agitation/delirum during hospitalization    Data Reviewed: Basic Metabolic Panel: Recent Labs  Lab 09/08/19 0840 09/11/19 0515  NA 141 143  K 4.0 3.6  CL 99 100  CO2 33* 33*  GLUCOSE 188* 121*  BUN 25* 25*  CREATININE 0.94 0.88  CALCIUM 8.9 9.1   Liver Function Tests: No results for input(s): AST, ALT, ALKPHOS, BILITOT, PROT, ALBUMIN in the last 168 hours. No results for input(s): LIPASE, AMYLASE in the last 168 hours. No results for input(s): AMMONIA in the last 168 hours. CBC: Recent Labs  Lab 09/08/19 0840 09/11/19 0515  WBC 5.2 5.3  HGB 9.3* 9.7*  HCT 30.7* 32.2*  MCV 90.6 90.7  PLT 188 248   Cardiac Enzymes: No results for input(s): CKTOTAL, CKMB, CKMBINDEX, TROPONINI in the last 168 hours. BNP (last 3 results) Recent Labs    08/01/19 0528 08/02/19 0355 08/03/19 0348  BNP 85.2 98.8 90.9    ProBNP (last 3 results) No results for input(s): PROBNP in the last 8760 hours.  CBG: Recent Labs  Lab 09/13/19 1652 09/13/19 2103 09/13/19 2333 09/14/19 0308 09/14/19 0806  GLUCAP 120* 142* 91 135* 103*    No results found for this or any previous visit (from the past 240 hour(s)).   Studies: No results found.  Scheduled Meds: . allopurinol  100 mg Oral Daily  . atorvastatin  10 mg Oral Daily  . chlorhexidine gluconate (MEDLINE KIT)  15 mL Mouth Rinse BID  . Chlorhexidine Gluconate Cloth  6 each Topical Daily  . dextromethorphan-guaiFENesin  1 tablet Oral BID  . enoxaparin (LOVENOX) injection  0.5 mg/kg Subcutaneous Q24H  . gabapentin  200 mg Oral TID  . insulin aspart  0-20 Units Subcutaneous Q4H  . insulin detemir  30 Units Subcutaneous Q12H  . melatonin  3 mg Oral QHS  . metoprolol tartrate  12.5 mg Oral BID  . multivitamin with minerals  1 tablet Oral Daily  . nutrition  supplement (JUVEN)  1 packet Oral BID BM  . pantoprazole  40 mg Oral QHS  . polyethylene glycol  17 g Per Tube Daily  . Ensure Max Protein  11 oz Oral BID  . QUEtiapine  100 mg Oral BID  . senna-docusate  2 tablet Oral BID  . sertraline  100 mg Oral Daily  . sodium chloride flush  10-40 mL Intracatheter Q12H  . sodium chloride HYPERTONIC  4 mL Nebulization BID  . torsemide  10 mg Oral Daily   Continuous Infusions: . sodium chloride    . sodium chloride Stopped (08/30/19 1427)    Active Problems:   OSA (obstructive sleep apnea)   COPD mixed type ([facility])   Obesity hypoventilation syndrome ([facility])   Decubitus ulcer   Acute pulmonary edema ([facility])   Chronic respiratory failure with hypoxia ([facility])   Physical deconditioning   Tracheostomy in place [facility])   Acute on chronic respiratory failure ([facility])   Acute respiratory failure ([place])   Endotracheal tube present   Acute airway  obstruction   Poorly controlled diabetes mellitus ([place])   Chronic diastolic CHF (congestive heart failure) ([place])   Consultants: ENT PCCM  Procedures:  Central venous catheter 7/11  Emergent t tracheostomy #6 [place] on 7/16  Bronchoscopy 7/16  Tracheoscopy via trach 7/16  Bronchoscopy 7/23  Emergent endotracheal intubation with 7.5 ET tube after unintended loss of tracheostomy tube on 7/24       Operative tracheostomy on 7/26        Emergent intubation after loss of tracheostomy tube on 7/30       Operative tracheostomy tube, thyroidectomy, cervical lipectomy and tracheostomy revision on 8/11         Antibiotics: Anti-infectives (From admission, onward)   Start     Dose/Rate Route Frequency Ordered Stop   08/13/19 1000  cefTRIAXone (ROCEPHIN) 2 g in sodium chloride 0.9 % 100 mL IVPB  Status:  Discontinued        2 g 200 mL/hr over 30 Minutes Intravenous Every 24 hours 08/13/19 0912 08/15/19 1150   08/12/19 1000  ceFEPIme (MAXIPIME) 2 g in sodium chloride 0.9 % 100 mL IVPB  Status:  Discontinued         2 g 200 mL/hr over 30 Minutes Intravenous Every 8 hours 08/12/19 0939 08/13/19 0912   08/09/19 1700  piperacillin-tazobactam (ZOSYN) IVPB 3.375 g  Status:  Discontinued        3.375 g 12.5 mL/hr over 240 Minutes Intravenous Every 8 hours 08/08/19 1634 08/08/19 1707   08/08/19 1707  piperacillin-tazobactam (ZOSYN) IVPB 3.375 g  Status:  Discontinued        3.375 g 12.5 mL/hr over 240 Minutes Intravenous Every 8 hours 08/08/19 1707 08/09/19 1128   08/04/19 2200  piperacillin-tazobactam (ZOSYN) IVPB 3.375 g  Status:  Discontinued        3.375 g 12.5 mL/hr over 240 Minutes Intravenous Every 8 hours 08/04/19 1413 08/08/19 1634   08/04/19 1300  vancomycin (VANCOREADY) IVPB 1750 mg/350 mL        1,750 mg 175 mL/hr over 120 Minutes Intravenous  Once 08/04/19 1249 08/04/19 1631   08/01/19 1230  cefTRIAXone (ROCEPHIN) 2 g in sodium chloride 0.9 % 100 mL IVPB  Status:  Discontinued        2 g 200 mL/hr over 30 Minutes Intravenous Every 24 hours 08/01/19 1226 08/04/19 1413   08/01/19 1230  azithromycin (ZITHROMAX) 500 mg in sodium chloride 0.9 % 250 mL IVPB  Status:  Discontinued        500 mg 250 mL/hr over 60 Minutes Intravenous Every 24 hours 08/01/19 1226 08/04/19 1413       Time spent: 20 minutes    Erin Hearing ANP  [facility] Pager [phone]. If 7PM-7AM, please contact night-coverage at [email or web address] 09/14/2019, 10:35 AM  LOS: 46 days

## 2019-09-14 NOTE — Progress Notes (Signed)
Occupational Therapy Treatment Patient Details Name: Timothy Le MRN: [id number] DOB: 05/10/1960 Today's Date: 09/14/2019    History of present illness Pt is 59 year old African-American male with PMH significant for DM 2, HTN, COPD on home oxygen, morbid obesity, obstructive sleep apnea. Patient presented to the ED on 07/30/2019 with worsening shortness of breath. Initial work-up suggested pulmonary edema and decompensated diastolic heart failure. He was intubated in the ED and admitted to ICU.  He required mechanical ventilation for most of the hospitalization. On 8/11 he underwent total thyroidectomy and cervical lipectomy with suturing of skin flaps to to trachea for tracheostomy. On 09/05/19 pt tx to ICU due to mucus plug.   OT comments  Pt very pleasant and willing to participate in therapy session, progressing steadily towards OT goals. Pt tolerating room/hallway level mobility, overall with minA (+2 safety/chair follow given LE weakness/fatigue and intermittent impulsivity). Pt requiring seated rest breaks throughout with cues to initiate. Completed additional seated ADL at supervision level end of session. Given progress have updated d/c recommendations as feel pt will benefit from higher level post acute rehab services prior to return home to ensure his safety and independence with ADL and mobility. Will continue to follow acutely.   Follow Up Recommendations  CIR (pending progress)    Equipment Recommendations  None recommended by OT (pt has necessary DME)          Precautions / Restrictions Precautions Precautions: Fall Precaution Comments: trach collar, PMV       Mobility Bed Mobility Overal bed mobility: Needs Assistance Bed Mobility: Supine to Sit     Supine to sit: Min guard     General bed mobility comments: for safety/lines   Transfers Overall transfer level: Needs assistance Equipment used: Rolling walker (2 wheeled) Transfers: Sit to/from Stand Sit to  Stand: Min assist;Min guard;+2 safety/equipment         General transfer comment: minA initially to rise from EOB, progressed to minguard assist from recliner, sit<>stand x2 from recliner     Balance Overall balance assessment: Needs assistance Sitting-balance support: No upper extremity supported;Feet supported Sitting balance-Leahy Scale: Fair     Standing balance support: During functional activity Standing balance-Leahy Scale: Poor Standing balance comment: walker and min assist for static standing                           ADL either performed or assessed with clinical judgement   ADL Overall ADL's : Needs assistance/impaired Eating/Feeding: Modified independent;Sitting Eating/Feeding Details (indicate cue type and reason): setup with lunch tray end of session Grooming: Wash/dry face;Supervision/safety;Sitting Grooming Details (indicate cue type and reason): pt reports typically performs in sitting at home (on shower chair at  sink)                              Functional mobility during ADLs: Minimal assistance;Rolling walker;+2 for safety/equipment                         Cognition Arousal/Alertness: Awake/alert Behavior During Therapy: Impulsive Overall Cognitive Status: Impaired/Different from baseline Area of Impairment: Safety/judgement                         Safety/Judgement: Decreased awareness of safety;Decreased awareness of deficits     General Comments: somewhat improved from previous sessions but still a safety concern  Exercises     Shoulder Instructions       General Comments VSS on 28% trach collar    Pertinent Vitals/ Pain       Pain Assessment: No/denies pain  Home Living                                          Prior Functioning/Environment              Frequency  Min 3X/week        Progress Toward Goals  OT Goals(current goals can now be found in the  care plan section)  Progress towards OT goals: Progressing toward goals  Acute Rehab OT Goals Patient Stated Goal: home with independence OT Goal Formulation: With patient Time For Goal Achievement: 09/17/19 Potential to Achieve Goals: Good ADL Goals Pt Will Perform Grooming: with set-up;sitting;with min guard assist Pt Will Perform Upper Body Bathing: with min guard assist;sitting Pt Will Perform Lower Body Bathing: with max assist;sit to/from stand;sitting/lateral leans Pt Will Perform Lower Body Dressing: with modified independence;with adaptive equipment;sit to/from stand Pt Will Transfer to Toilet: with max assist;with +2 assist;stand pivot transfer Pt Will Perform Toileting - Clothing Manipulation and hygiene: with max assist;sitting/lateral leans;sit to/from stand Pt/caregiver will Perform Home Exercise Program: Increased strength;Both right and left upper extremity;With theraband;Independently;With written HEP provided Additional ADL Goal #1: Pt will complete bed mobility at min A level to prepare for OOB/EOB ADLs  Plan Discharge plan needs to be updated    Co-evaluation    PT/OT/SLP Co-Evaluation/Treatment: Yes Reason for Co-Treatment: For patient/therapist safety;To address functional/ADL transfers   OT goals addressed during session: ADL's and self-care      AM-PAC OT "6 Clicks" Daily Activity     Outcome Measure   Help from another person eating meals?: None Help from another person taking care of personal grooming?: A Little Help from another person toileting, which includes using toliet, bedpan, or urinal?: A Lot Help from another person bathing (including washing, rinsing, drying)?: A Lot Help from another person to put on and taking off regular upper body clothing?: A Little Help from another person to put on and taking off regular lower body clothing?: A Lot 6 Click Score: 16    End of Session Equipment Utilized During Treatment: Gait belt;Rolling  walker  OT Visit Diagnosis: Unsteadiness on feet (R26.81);Other abnormalities of gait and mobility (R26.89);Muscle weakness (generalized) (M62.81)   Activity Tolerance Patient tolerated treatment well   Patient Left in chair;with call bell/phone within reach;with chair alarm set   Nurse Communication Mobility status        Time: 5573-2202 OT Time Calculation (min): 28 min  Charges: OT General Charges $OT Visit: 1 Visit OT Treatments $Self Care/Home Management : 8-22 mins  Lou Cal, OT Acute Rehabilitation Services Pager [phone] Office [place] 09/14/2019, 12:30 PM

## 2019-09-14 NOTE — Progress Notes (Signed)
Physical Therapy Treatment Patient Details Name: Timothy Le MRN: [id number] DOB: 11-19-1960 Today's Date: 09/14/2019    History of Present Illness Pt is 59 year old African-American male with PMH significant for DM 2, HTN, COPD on home oxygen, morbid obesity, obstructive sleep apnea. Patient presented to the ED on 07/30/2019 with worsening shortness of breath. Initial work-up suggested pulmonary edema and decompensated diastolic heart failure. He was intubated in the ED and admitted to ICU.  He required mechanical ventilation for most of the hospitalization. On 8/11 he underwent total thyroidectomy and cervical lipectomy with suturing of skin flaps to to trachea for tracheostomy. On 09/05/19 pt tx to ICU due to mucus plug.    PT Comments    Pt making good progress with mobility but still not able to safely amb on his own. DC options complicated. Feel he could benefit from CIR and reach modified independent level with mobility but that likely not an option. Pt not agreeable to SNF due to financial issues. Will continue to work with patient to maximize progress if he chooses to return home alone.    Follow Up Recommendations  CIR     Equipment Recommendations  None recommended by PT    Recommendations for Other Services       Precautions / Restrictions Precautions Precautions: Fall Precaution Comments: trach collar, PMV    Mobility  Bed Mobility Overal bed mobility: Needs Assistance Bed Mobility: Supine to Sit     Supine to sit: Min guard     General bed mobility comments: for safety/lines   Transfers Overall transfer level: Needs assistance Equipment used: Rolling walker (2 wheeled) Transfers: Sit to/from Stand Sit to Stand: Min assist;Min guard;+2 safety/equipment         General transfer comment: Assist to bring hips up on initial sit to stand from bed but progressed to min guard from recliner on other 2 sit to stands  Ambulation/Gait Ambulation/Gait assistance:  Min assist;+2 safety/equipment Gait Distance (Feet): 45 Feet (x 2) Assistive device: Rolling walker (2 wheeled) Gait Pattern/deviations: Step-through pattern;Decreased stride length;Trunk flexed Gait velocity: decr Gait velocity interpretation: <1.8 ft/sec, indicate of risk for recurrent falls General Gait Details: Assist for balance and support. Verbal cues not to get too close to front of walker. Legs tremulous.    Stairs             Wheelchair Mobility    Modified Rankin (Stroke Patients Only)       Balance Overall balance assessment: Needs assistance Sitting-balance support: No upper extremity supported;Feet supported Sitting balance-Leahy Scale: Fair     Standing balance support: During functional activity Standing balance-Leahy Scale: Poor Standing balance comment: walker and min assist for static standing                            Cognition Arousal/Alertness: Awake/alert Behavior During Therapy: Impulsive Overall Cognitive Status: Impaired/Different from baseline Area of Impairment: Safety/judgement                         Safety/Judgement: Decreased awareness of safety;Decreased awareness of deficits     General Comments: Improved safety/judgement from prior session but still some deficits      Exercises      General Comments General comments (skin integrity, edema, etc.): VSS on RA      Pertinent Vitals/Pain Pain Assessment: No/denies pain    Home Living  Prior Function            PT Goals (current goals can now be found in the care plan section) Acute Rehab PT Goals Patient Stated Goal: home with independence PT Goal Formulation: With patient Time For Goal Achievement: 09/28/19 Potential to Achieve Goals: Good Progress towards PT goals: Goals met and updated - see care plan    Frequency    Min 3X/week      PT Plan Discharge plan needs to be updated    Co-evaluation  PT/OT/SLP Co-Evaluation/Treatment: Yes Reason for Co-Treatment: For patient/therapist safety PT goals addressed during session: Mobility/safety with mobility OT goals addressed during session: ADL's and self-care      AM-PAC PT "6 Clicks" Mobility   Outcome Measure  Help needed turning from your back to your side while in a flat bed without using bedrails?: A Little Help needed moving from lying on your back to sitting on the side of a flat bed without using bedrails?: A Little Help needed moving to and from a bed to a chair (including a wheelchair)?: A Little Help needed standing up from a chair using your arms (e.g., wheelchair or bedside chair)?: A Little Help needed to walk in hospital room?: A Little Help needed climbing 3-5 steps with a railing? : Total 6 Click Score: 16    End of Session Equipment Utilized During Treatment: Gait belt Activity Tolerance: Patient tolerated treatment well Patient left: in chair;with call bell/phone within reach;with chair alarm set Nurse Communication: Mobility status PT Visit Diagnosis: Unsteadiness on feet (R26.81);Muscle weakness (generalized) (M62.81);Other abnormalities of gait and mobility (R26.89)     Time: 1119-1150 PT Time Calculation (min) (ACUTE ONLY): 31 min  Charges:  $Gait Training: 8-22 mins                       PT Acute Rehabilitation Services Pager [phone] Office [phone]     W Maycok 09/14/2019, 3:20 PM   

## 2019-09-15 ENCOUNTER — Other Ambulatory Visit: Payer: Self-pay

## 2019-09-15 ENCOUNTER — Inpatient Hospital Stay (HOSPITAL_COMMUNITY)
Admission: RE | Admit: 2019-09-15 | Discharge: 2019-09-26 | DRG: 945 | Disposition: A | Discharge status: Home Health | Payer: Medicaid Other | Source: Intra-hospital | Attending: Physical Medicine & Rehabilitation | Admitting: Physical Medicine & Rehabilitation

## 2019-09-15 DIAGNOSIS — Z833 Family history of diabetes mellitus: Secondary | ICD-10-CM

## 2019-09-15 DIAGNOSIS — I5032 Chronic diastolic (congestive) heart failure: Secondary | ICD-10-CM | POA: Diagnosis present

## 2019-09-15 DIAGNOSIS — M25462 Effusion, left knee: Secondary | ICD-10-CM | POA: Diagnosis present

## 2019-09-15 DIAGNOSIS — B965 Pseudomonas (aeruginosa) (mallei) (pseudomallei) as the cause of diseases classified elsewhere: Secondary | ICD-10-CM | POA: Diagnosis present

## 2019-09-15 DIAGNOSIS — Z930 Tracheostomy status: Secondary | ICD-10-CM | POA: Diagnosis not present

## 2019-09-15 DIAGNOSIS — Z79899 Other long term (current) drug therapy: Secondary | ICD-10-CM

## 2019-09-15 DIAGNOSIS — L89302 Pressure ulcer of unspecified buttock, stage 2: Secondary | ICD-10-CM | POA: Diagnosis present

## 2019-09-15 DIAGNOSIS — I110 Hypertensive heart disease with heart failure: Secondary | ICD-10-CM | POA: Diagnosis present

## 2019-09-15 DIAGNOSIS — R300 Dysuria: Secondary | ICD-10-CM | POA: Diagnosis present

## 2019-09-15 DIAGNOSIS — E1165 Type 2 diabetes mellitus with hyperglycemia: Secondary | ICD-10-CM | POA: Diagnosis present

## 2019-09-15 DIAGNOSIS — E876 Hypokalemia: Secondary | ICD-10-CM | POA: Diagnosis present

## 2019-09-15 DIAGNOSIS — Z825 Family history of asthma and other chronic lower respiratory diseases: Secondary | ICD-10-CM

## 2019-09-15 DIAGNOSIS — Z794 Long term (current) use of insulin: Secondary | ICD-10-CM | POA: Diagnosis not present

## 2019-09-15 DIAGNOSIS — J398 Other specified diseases of upper respiratory tract: Secondary | ICD-10-CM | POA: Diagnosis present

## 2019-09-15 DIAGNOSIS — E1142 Type 2 diabetes mellitus with diabetic polyneuropathy: Secondary | ICD-10-CM | POA: Diagnosis present

## 2019-09-15 DIAGNOSIS — J40 Bronchitis, not specified as acute or chronic: Secondary | ICD-10-CM

## 2019-09-15 DIAGNOSIS — J449 Chronic obstructive pulmonary disease, unspecified: Secondary | ICD-10-CM | POA: Diagnosis present

## 2019-09-15 DIAGNOSIS — Z6841 Body Mass Index (BMI) 40.0 and over, adult: Secondary | ICD-10-CM | POA: Diagnosis not present

## 2019-09-15 DIAGNOSIS — F419 Anxiety disorder, unspecified: Secondary | ICD-10-CM | POA: Diagnosis present

## 2019-09-15 DIAGNOSIS — J9611 Chronic respiratory failure with hypoxia: Secondary | ICD-10-CM | POA: Diagnosis present

## 2019-09-15 DIAGNOSIS — R5381 Other malaise: Secondary | ICD-10-CM

## 2019-09-15 DIAGNOSIS — N390 Urinary tract infection, site not specified: Secondary | ICD-10-CM | POA: Diagnosis present

## 2019-09-15 DIAGNOSIS — Z8249 Family history of ischemic heart disease and other diseases of the circulatory system: Secondary | ICD-10-CM | POA: Diagnosis not present

## 2019-09-15 DIAGNOSIS — R059 Cough, unspecified: Secondary | ICD-10-CM

## 2019-09-15 DIAGNOSIS — E1169 Type 2 diabetes mellitus with other specified complication: Secondary | ICD-10-CM | POA: Diagnosis not present

## 2019-09-15 DIAGNOSIS — E662 Morbid (severe) obesity with alveolar hypoventilation: Secondary | ICD-10-CM | POA: Diagnosis present

## 2019-09-15 DIAGNOSIS — Z87891 Personal history of nicotine dependence: Secondary | ICD-10-CM | POA: Diagnosis not present

## 2019-09-15 LAB — GLUCOSE, CAPILLARY
Glucose-Capillary: 101 mg/dL — ABNORMAL HIGH (ref 70–99)
Glucose-Capillary: 102 mg/dL — ABNORMAL HIGH (ref 70–99)
Glucose-Capillary: 110 mg/dL — ABNORMAL HIGH (ref 70–99)
Glucose-Capillary: 113 mg/dL — ABNORMAL HIGH (ref 70–99)
Glucose-Capillary: 169 mg/dL — ABNORMAL HIGH (ref 70–99)
Glucose-Capillary: 207 mg/dL — ABNORMAL HIGH (ref 70–99)

## 2019-09-15 MED ORDER — DM-GUAIFENESIN ER 30-600 MG PO TB12: 1.0000 | ORAL_TABLET | Freq: Two times a day (BID) | ORAL | Status: DC

## 2019-09-15 MED ORDER — SERTRALINE HCL 100 MG PO TABS
100.0000 mg | ORAL_TABLET | Freq: Every day | ORAL | Status: DC
  Administered 2019-09-16 – 2019-09-26 (×11): 100 mg via ORAL
  Filled 2019-09-15 (×11): qty 1

## 2019-09-15 MED ORDER — PANTOPRAZOLE SODIUM 40 MG PO TBEC: 40.0000 mg | DELAYED_RELEASE_TABLET | Freq: Every day | ORAL | Status: DC

## 2019-09-15 MED ORDER — FLEET ENEMA 7-19 GM/118ML RE ENEM: 1.0000 | ENEMA | Freq: Once | RECTAL | Status: DC | PRN

## 2019-09-15 MED ORDER — METOPROLOL TARTRATE 25 MG PO TABS
12.5000 mg | ORAL_TABLET | Freq: Two times a day (BID) | ORAL | Status: DC
Start: 2019-09-15 — End: 2019-09-26

## 2019-09-15 MED ORDER — BISACODYL 10 MG RE SUPP: 10.0000 mg | Freq: Every day | RECTAL | Status: DC | PRN

## 2019-09-15 MED ORDER — ENOXAPARIN SODIUM 80 MG/0.8ML ~~LOC~~ SOLN
0.5000 mg/kg | SUBCUTANEOUS | Status: DC
Start: 2019-09-16 — End: 2019-09-26

## 2019-09-15 MED ORDER — OXYCODONE HCL 10 MG PO TABS
10.0000 mg | ORAL_TABLET | Freq: Four times a day (QID) | ORAL | 0 refills | Status: DC | PRN
Start: 2019-09-15 — End: 2019-09-26

## 2019-09-15 MED ORDER — PROCHLORPERAZINE MALEATE 5 MG PO TABS: 5.0000 mg | ORAL_TABLET | Freq: Four times a day (QID) | ORAL | Status: DC | PRN

## 2019-09-15 MED ORDER — ACETAMINOPHEN 325 MG PO TABS
325.0000 mg | ORAL_TABLET | ORAL | Status: DC | PRN
  Administered 2019-09-18 (×2): 650 mg via ORAL
  Filled 2019-09-15 (×2): qty 2

## 2019-09-15 MED ORDER — SODIUM CHLORIDE 0.9% FLUSH: 10.0000 mL | Freq: Two times a day (BID) | INTRAVENOUS | Status: DC

## 2019-09-15 MED ORDER — PANTOPRAZOLE SODIUM 40 MG PO TBEC
40.0000 mg | DELAYED_RELEASE_TABLET | Freq: Every day | ORAL | Status: DC
  Administered 2019-09-15 – 2019-09-25 (×11): 40 mg via ORAL
  Filled 2019-09-15 (×11): qty 1

## 2019-09-15 MED ORDER — INSULIN GLARGINE 100 UNIT/ML ~~LOC~~ SOLN
33.0000 [IU] | Freq: Two times a day (BID) | SUBCUTANEOUS | Status: DC
  Administered 2019-09-15 – 2019-09-26 (×22): 33 [IU] via SUBCUTANEOUS
  Filled 2019-09-15 (×23): qty 0.33

## 2019-09-15 MED ORDER — INSULIN ASPART 100 UNIT/ML ~~LOC~~ SOLN: 0.0000 [IU] | SUBCUTANEOUS | 11 refills | Status: DC

## 2019-09-15 MED ORDER — TORSEMIDE 20 MG PO TABS
10.0000 mg | ORAL_TABLET | Freq: Every day | ORAL | Status: DC
  Administered 2019-09-16 – 2019-09-26 (×11): 10 mg via ORAL
  Filled 2019-09-15 (×11): qty 1

## 2019-09-15 MED ORDER — JUVEN PO PACK
1.0000 | PACK | Freq: Two times a day (BID) | ORAL | 0 refills | Status: DC
Start: 2019-09-15 — End: 2019-09-15

## 2019-09-15 MED ORDER — OXYCODONE HCL 5 MG PO TABS
10.0000 mg | ORAL_TABLET | Freq: Four times a day (QID) | ORAL | Status: DC | PRN
  Administered 2019-09-15 – 2019-09-26 (×14): 10 mg via ORAL
  Filled 2019-09-15 (×14): qty 2

## 2019-09-15 MED ORDER — ENOXAPARIN SODIUM 80 MG/0.8ML ~~LOC~~ SOLN: 75.0000 mg | SUBCUTANEOUS | Status: DC

## 2019-09-15 MED ORDER — JUVEN PO PACK: 1.0000 | PACK | Freq: Two times a day (BID) | ORAL | 0 refills | Status: DC

## 2019-09-15 MED ORDER — PROCHLORPERAZINE 25 MG RE SUPP: 12.5000 mg | Freq: Four times a day (QID) | RECTAL | Status: DC | PRN

## 2019-09-15 MED ORDER — GABAPENTIN 100 MG PO CAPS: 200.0000 mg | ORAL_CAPSULE | Freq: Three times a day (TID) | ORAL | Status: DC

## 2019-09-15 MED ORDER — POLYETHYLENE GLYCOL 3350 17 G PO PACK: 17.0000 g | PACK | Freq: Every day | ORAL | Status: DC

## 2019-09-15 MED ORDER — SODIUM CHLORIDE 0.9% FLUSH: 10.0000 mL | INTRAVENOUS | Status: DC | PRN

## 2019-09-15 MED ORDER — ATORVASTATIN CALCIUM 10 MG PO TABS
10.0000 mg | ORAL_TABLET | Freq: Every day | ORAL | Status: DC
  Administered 2019-09-16 – 2019-09-26 (×11): 10 mg via ORAL
  Filled 2019-09-15 (×11): qty 1

## 2019-09-15 MED ORDER — ALUM & MAG HYDROXIDE-SIMETH 200-200-20 MG/5ML PO SUSP: 30.0000 mL | ORAL | Status: DC | PRN

## 2019-09-15 MED ORDER — SODIUM CHLORIDE 0.9 % IV SOLN
250.0000 mL | INTRAVENOUS | 0 refills | Status: DC
Start: 2019-09-15 — End: 2019-09-26

## 2019-09-15 MED ORDER — JUVEN PO PACK
1.0000 | PACK | Freq: Two times a day (BID) | ORAL | Status: DC
  Administered 2019-09-16 – 2019-09-25 (×19): 1 via ORAL
  Filled 2019-09-15 (×19): qty 1

## 2019-09-15 MED ORDER — MELATONIN 3 MG PO TABS
3.0000 mg | ORAL_TABLET | Freq: Every day | ORAL | Status: DC
  Administered 2019-09-15 – 2019-09-25 (×11): 3 mg via ORAL
  Filled 2019-09-15 (×11): qty 1

## 2019-09-15 MED ORDER — INSULIN DETEMIR 100 UNIT/ML ~~LOC~~ SOLN: 33.0000 [IU] | Freq: Two times a day (BID) | SUBCUTANEOUS | 11 refills | Status: DC

## 2019-09-15 MED ORDER — GUAIFENESIN-DM 100-10 MG/5ML PO SYRP: 5.0000 mL | ORAL_SOLUTION | Freq: Four times a day (QID) | ORAL | Status: DC | PRN

## 2019-09-15 MED ORDER — INSULIN GLARGINE 100 UNITS/ML SOLOSTAR PEN: 35.0000 [IU] | PEN_INJECTOR | Freq: Every day | SUBCUTANEOUS | Status: DC

## 2019-09-15 MED ORDER — GLUCERNA SHAKE PO LIQD
237.0000 mL | Freq: Three times a day (TID) | ORAL | Status: DC
  Administered 2019-09-15: 237 mL via ORAL

## 2019-09-15 MED ORDER — ALUM & MAG HYDROXIDE-SIMETH 200-200-20 MG/5ML PO SUSP: 30.0000 mL | ORAL | 0 refills | Status: DC | PRN

## 2019-09-15 MED ORDER — IPRATROPIUM-ALBUTEROL 0.5-2.5 (3) MG/3ML IN SOLN: 3.0000 mL | Freq: Four times a day (QID) | RESPIRATORY_TRACT | Status: DC | PRN

## 2019-09-15 MED ORDER — GABAPENTIN 100 MG PO CAPS
200.0000 mg | ORAL_CAPSULE | Freq: Three times a day (TID) | ORAL | Status: DC
  Administered 2019-09-15 – 2019-09-26 (×32): 200 mg via ORAL
  Filled 2019-09-15 (×31): qty 2

## 2019-09-15 MED ORDER — POLYETHYLENE GLYCOL 3350 17 G PO PACK: 17.0000 g | PACK | Freq: Every day | ORAL | 0 refills | Status: DC

## 2019-09-15 MED ORDER — ENOXAPARIN SODIUM 80 MG/0.8ML ~~LOC~~ SOLN
70.0000 mg | SUBCUTANEOUS | Status: DC
  Administered 2019-09-16 – 2019-09-26 (×11): 70 mg via SUBCUTANEOUS
  Filled 2019-09-15 (×11): qty 0.7

## 2019-09-15 MED ORDER — TORSEMIDE 10 MG PO TABS
10.0000 mg | ORAL_TABLET | Freq: Every day | ORAL | Status: DC
Start: 2019-09-16 — End: 2019-09-26

## 2019-09-15 MED ORDER — TRAZODONE HCL 50 MG PO TABS
25.0000 mg | ORAL_TABLET | Freq: Every evening | ORAL | Status: DC | PRN
  Administered 2019-09-15 – 2019-09-25 (×3): 50 mg via ORAL
  Filled 2019-09-15 (×4): qty 1

## 2019-09-15 MED ORDER — PROCHLORPERAZINE EDISYLATE 10 MG/2ML IJ SOLN: 5.0000 mg | Freq: Four times a day (QID) | INTRAMUSCULAR | Status: DC | PRN

## 2019-09-15 MED ORDER — ADULT MULTIVITAMIN W/MINERALS CH
1.0000 | ORAL_TABLET | Freq: Every day | ORAL | Status: DC
  Administered 2019-09-16 – 2019-09-26 (×11): 1 via ORAL
  Filled 2019-09-15 (×11): qty 1

## 2019-09-15 MED ORDER — SODIUM CHLORIDE 3 % IN NEBU
4.0000 mL | INHALATION_SOLUTION | Freq: Two times a day (BID) | RESPIRATORY_TRACT | Status: DC
  Administered 2019-09-15 – 2019-09-18 (×6): 4 mL via RESPIRATORY_TRACT
  Filled 2019-09-15 (×8): qty 4

## 2019-09-15 MED ORDER — SODIUM CHLORIDE 0.9% FLUSH
10.0000 mL | INTRAVENOUS | Status: DC | PRN
  Administered 2019-09-18 – 2019-09-25 (×3): 10 mL

## 2019-09-15 MED ORDER — SENNOSIDES-DOCUSATE SODIUM 8.6-50 MG PO TABS
2.0000 | ORAL_TABLET | Freq: Two times a day (BID) | ORAL | Status: DC
  Administered 2019-09-15 – 2019-09-26 (×20): 2 via ORAL
  Filled 2019-09-15 (×22): qty 2

## 2019-09-15 MED ORDER — ACETAMINOPHEN 325 MG PO TABS
650.0000 mg | ORAL_TABLET | Freq: Four times a day (QID) | ORAL | Status: DC | PRN
Start: 2019-09-15 — End: 2019-09-26

## 2019-09-15 MED ORDER — CHLORHEXIDINE GLUCONATE CLOTH 2 % EX PADS
6.0000 | MEDICATED_PAD | Freq: Every day | CUTANEOUS | Status: DC
  Administered 2019-09-15 – 2019-09-26 (×10): 6 via TOPICAL

## 2019-09-15 MED ORDER — ALLOPURINOL 100 MG PO TABS
100.0000 mg | ORAL_TABLET | Freq: Every day | ORAL | Status: DC
  Administered 2019-09-16 – 2019-09-26 (×11): 100 mg via ORAL
  Filled 2019-09-15 (×11): qty 1

## 2019-09-15 MED ORDER — MOMETASONE FURO-FORMOTEROL FUM 100-5 MCG/ACT IN AERO
2.0000 | INHALATION_SPRAY | Freq: Two times a day (BID) | RESPIRATORY_TRACT | Status: DC
  Administered 2019-09-17 – 2019-09-26 (×16): 2 via RESPIRATORY_TRACT
  Filled 2019-09-15 (×2): qty 8.8

## 2019-09-15 MED ORDER — MELATONIN 3 MG PO TABS
3.0000 mg | ORAL_TABLET | Freq: Every day | ORAL | 0 refills | Status: DC
Start: 2019-09-15 — End: 2019-09-26

## 2019-09-15 MED ORDER — GLUCERNA SHAKE PO LIQD: 237.0000 mL | Freq: Three times a day (TID) | ORAL | 0 refills | Status: DC

## 2019-09-15 MED ORDER — DIPHENHYDRAMINE HCL 12.5 MG/5ML PO ELIX: 12.5000 mg | ORAL_SOLUTION | Freq: Four times a day (QID) | ORAL | Status: DC | PRN

## 2019-09-15 MED ORDER — SODIUM CHLORIDE 3 % IN NEBU: 4.0000 mL | INHALATION_SOLUTION | Freq: Two times a day (BID) | RESPIRATORY_TRACT | 12 refills | Status: DC

## 2019-09-15 MED ORDER — ADULT MULTIVITAMIN W/MINERALS CH: 1.0000 | ORAL_TABLET | Freq: Every day | ORAL | Status: DC

## 2019-09-15 MED ORDER — POLYETHYLENE GLYCOL 3350 17 G PO PACK
17.0000 g | PACK | Freq: Every day | ORAL | Status: DC | PRN
  Filled 2019-09-15 (×2): qty 1

## 2019-09-15 MED ORDER — CHLORHEXIDINE GLUCONATE 0.12% ORAL RINSE (MEDLINE KIT): 15.0000 mL | Freq: Two times a day (BID) | OROMUCOSAL | 0 refills | Status: DC

## 2019-09-15 MED ORDER — SENNOSIDES-DOCUSATE SODIUM 8.6-50 MG PO TABS: 2.0000 | ORAL_TABLET | Freq: Two times a day (BID) | ORAL | Status: DC

## 2019-09-15 MED ORDER — QUETIAPINE FUMARATE 50 MG PO TABS
100.0000 mg | ORAL_TABLET | Freq: Two times a day (BID) | ORAL | Status: DC
  Administered 2019-09-15 – 2019-09-26 (×22): 100 mg via ORAL
  Filled 2019-09-15 (×22): qty 2

## 2019-09-15 MED ORDER — DM-GUAIFENESIN ER 30-600 MG PO TB12
1.0000 | ORAL_TABLET | Freq: Two times a day (BID) | ORAL | Status: DC
  Administered 2019-09-15 – 2019-09-26 (×22): 1 via ORAL
  Filled 2019-09-15 (×22): qty 1

## 2019-09-15 MED ORDER — METOPROLOL TARTRATE 12.5 MG HALF TABLET
12.5000 mg | ORAL_TABLET | Freq: Two times a day (BID) | ORAL | Status: DC
  Administered 2019-09-15 – 2019-09-26 (×22): 12.5 mg via ORAL
  Filled 2019-09-15 (×22): qty 1

## 2019-09-15 MED ORDER — QUETIAPINE FUMARATE 100 MG PO TABS: 100.0000 mg | ORAL_TABLET | Freq: Two times a day (BID) | ORAL | Status: DC

## 2019-09-15 NOTE — Discharge Summary (Signed)
Physician Discharge Summary  Timothy Le TOI:[id number] DOB: September 04, 1960 DOA: 07/30/2019  PCP: Kerin Perna, NP  Admit date: 07/30/2019 Discharge date: 09/15/2019  Time spent: 42 minutes  Recommendations for CIR Follow-up:  1. Trach changed to cuffless on 8/27 by PCCM 2. Foley catheter also discontinued on date of discharge 3. Plans to discharge home independently to prior apartment 4. Started on the following new medications this admission: Lopressor, Neurontin and Seroquel 5. Preadmission Levemir dose 35 units twice daily and current insulin dose 33 units-given renal function did not resume preadmission metformin   Discharge Diagnoses:  Active Problems:   OSA (obstructive sleep apnea)   COPD mixed type (HCC)   Obesity hypoventilation syndrome (HCC)   Decubitus ulcer   Acute pulmonary edema (HCC)   Chronic respiratory failure with hypoxia (HCC)   Physical deconditioning   Tracheostomy in place [facility])   Acute on chronic respiratory failure (HCC)   Acute respiratory failure (HCC)   Endotracheal tube present   Acute airway obstruction   Poorly controlled diabetes mellitus ([place])   Chronic diastolic CHF (congestive heart failure) ([place])   Discharge Condition: Stable  Diet recommendation: Carbohydrate modified  Filed Weights   09/10/19 0453 09/12/19 0345 09/15/19 0500  Weight: (!) 147 kg (!) 145.6 kg (!) 140.4 kg    History of present illness:  Timothy Le a 59 y.o.year old malewith medical history significant for type 2 diabetes, HTN, COPD on home oxygen, morbid obesity, OSA, history of tracheal stenosis and prior tracheostomy who presented on 7/11/2021with worsening shortness of breath or being off his CPAP for the past 2 weeks (per family it was broken) was found to have acute on chronic hypoxemic/hypercapnic respiratory failure due to OHS/OSA complicated by large thyroid goiter. Patient required intubation upon presentation to ED due to minimally  responsive, with marked hypoxia and presumptive hypercarbia. Initial CO2 on arrival 64, pH 7.25 with diffuse pulmonary edema on chest x-ray.   Hospital course complicated by failed extubation related to acute respiratory distress and failed attempts of reintubation requiring revision tracheostomy on 7/16. 7/24 tracheostomy became dislodged in the setting of agitation required emergent reintubation followed by tracheostomy revision with ENT on 7/26. 8/9 mucous plug resulting in bradycardia and CODE BLUE. Total thyroidectomy and cervical lipectomy and trach revision with ENT on 8/11.  Patient was transferred to progressive care unit under hospital service on 8/15. 8/18 transfer back to stepdown unit due to desaturation in the setting of mucous plugging and significant bradycardia and attempted chest compressions (but no actual loss of pulse) Bronchoscopy was performed by PCCM on arrival to stepdown unit on 8/18 with no evidence of obstructive mucus or debris and clear visualization of the carina.   Patient has been monitored progressive unit since thenand has no repeat episodes of respiratory distress since 8/18, tolerating 5 L/FIO2 28% via trach collar with nebulized hypertonic saline as directed by PCCM consultants.  Evaluated by PCCM on 8/26 with recommendation to transition to cuffless trach (same style trach patient used previously in the outpatient setting).  Patient otherwise would be stable for discharge home from a respiratory standpoint.  Patient exhibiting significant deconditioning that would benefit from aggressive PT and OT per recommendation of those therapies.  CIR reconsulted with recommendation to discharge noting bed available on 8/27 tracheostomy tube subsequently changed out on date of discharge to CIR.  Hospital Course:  Acute on chronic hypoxic hypercapnic respiratory failure.  -Presumed 2/2 OHS/OSA as well as further complication due to large thyroid  goiter.   -Bronchoscopy on 8/18: clear airways, patient continues to have recurrent episodes of mucous plugging and near cardiac events. No respiratory distresssince 8/18. Currently on trach collar with 5 L -PCCM  followed weekly, not a candidate for downsizing and requires permanent tracheostomy 2/2 underlying OHS/OSA-subsequently transition to cuffless trach on 8/27 -Continue schedulD Mucinex, as needed DuoNebs, nebulized 3% saline twice daily and aggressive pulmonary toileting  Chronic diastolic CHF -euvolemic on exam -continue torsemide 10 mg daily,not on home metolazone -Continue to hold home Cardura; started on Lopressor12.5 mg BIDduring hospital stay  Profound physical deconditioning -Reports significant shortness of breath with ambulatory effort to doorway with assistance. -8/26 recommendation made for CIR reevaluation by PT and OT.  CIR has accepted patient for admission on 8/27 to continue aggressive rehabilitative therapies so patient can transition to independent apartment when ready for discharge  HLD -continue Lipitor  OSA/OHS -As above  Peripheral neuropathy -Currently not reporting any significant pain -Continue Neurontin200mg  TID, startedduring thishospitalization  Type 2 diabetes, poorly controlled insulin-dependent -HgbA1c 14.9  -fasting blood sugar adequateand CBGs at goal-as of 8/26 SSI insulin needs have decreased to only 9 units over 24 hours. -Increase Levemir to 33 units twice daily -Patient was on Metformin prior to admission (not being given during this hospitalization) and is currently on injectable insulin given his massively elevated hemoglobin A1c.  Patient will need to demonstrate ability to self administer insulin prior to discharge.  GERD, stable --continue Protonix  Depression -No symptoms -continue Zoloft  Gout -Symptomatic -continue allopurinol  Acute metabolic encephalopathy, resolved.  -Has had episodes of agitation likely  related to prolonged hospitalization, hospital-acquired delirium and metabolic dysfunction related to respiratory failure.  -MRI brain showed no acute abnormalities. -Started on Seroquel BID for agitation/delirum during hospitalization  Procedures:  Central venous catheter 7/11  Emergent t tracheostomy #6 [place] on 7/16  Bronchoscopy 7/16  Tracheoscopy via trach 7/16  Bronchoscopy 7/23  Emergent endotracheal intubation with 7.5 ET tube after unintended loss of tracheostomy tube on 7/24       Operative tracheostomy on 7/26        Emergent intubation after loss of tracheostomy tube on 7/30       Operative tracheostomy tube, thyroidectomy, cervical lipectomy and tracheostomy revision on 8/11  Consultations: ENT PCCM  Discharge Exam: Vitals:   09/15/19 0835 09/15/19 1058  BP:  129/80  Pulse: 80   Resp:  20  Temp:  98.2 F (36.8 C)  SpO2: 95%    Constitutional: NAD, calm, comfortable Neck: Midline tracheostomy patent plans to transition to cuffless permanent trach on 8/27-PMV in place and patient able to vocalize Respiratory: coarse to auscultation bilaterally, no wheezing, no crackles. Normal respiratory effort. No accessory muscle use.  FiO2 28% Cardiovascular: Regular rate and rhythm, no murmurs / rubs / gallops. No extremity edema. 2+ pedal pulses.  Abdomen: no tenderness, no masses palpated.  Obese. Bowel sounds positive.  Tolerating solid diet Musculoskeletal: no clubbing / cyanosis. No joint deformity upper and lower extremities.  Normal muscle tone.  Skin: no rashes, lesions, ulcers. No induration Neurologic: CN 2-12 grossly intact. Sensation intact, DTR normal. Strength 5/5 x all 4 extremities.  Psychiatric: Normal judgment and insight. Alert and oriented x 3.  Appropriate mood.    Discharge Instructions   Discharge Instructions    Diet Carb Modified   Complete by: As directed    Discharge wound care:   Complete by: As directed    CUT SLIVER OF AQUACEL AND  APPLY TO  GLUTEAL FOLD -COVER WITH FOAM DRESSING DAILY   Increase activity slowly   Complete by: As directed      Allergies as of 09/15/2019   No Known Allergies     Medication List    STOP taking these medications   BD ULTRA-FINE PEN NEEDLES 29G X 12.7MM Misc Generic drug: Insulin Pen Needle   budesonide-formoterol 160-4.5 MCG/ACT inhaler Commonly known as: Symbicort   desonide 0.05 % lotion Commonly known as: DESOWEN   insulin glargine 100 unit/mL Sopn Commonly known as: LANTUS   ketoconazole 2 % cream Commonly known as: NIZORAL   metFORMIN 1000 MG tablet Commonly known as: Glucophage   metolazone 2.5 MG tablet Commonly known as: ZAROXOLYN   NovoLOG FlexPen 100 UNIT/ML FlexPen Generic drug: insulin aspart Replaced by: insulin aspart 100 UNIT/ML injection   potassium chloride SA 20 MEQ tablet Commonly known as: KLOR-CON   sertraline 100 MG tablet Commonly known as: ZOLOFT     TAKE these medications   acetaminophen 325 MG tablet Commonly known as: TYLENOL Take 2 tablets (650 mg total) by mouth every 6 (six) hours as needed for moderate pain.   allopurinol 100 MG tablet Commonly known as: ZYLOPRIM Take 100 mg by mouth daily.   alum & mag hydroxide-simeth 200-200-20 MG/5ML suspension Commonly known as: MAALOX/MYLANTA Take 30 mLs by mouth every 4 (four) hours as needed for indigestion, heartburn or flatulence.   atorvastatin 10 MG tablet Commonly known as: LIPITOR Take 10 mg by mouth daily.   chlorhexidine gluconate (MEDLINE KIT) 0.12 % solution Commonly known as: PERIDEX 15 mLs by Mouth Rinse route 2 (two) times daily.   dextromethorphan-guaiFENesin 30-600 MG 12hr tablet Commonly known as: MUCINEX DM Take 1 tablet by mouth 2 (two) times daily.   enoxaparin 80 MG/0.8ML injection Commonly known as: LOVENOX Inject 0.75 mLs (75 mg total) into the skin daily. Start taking on: September 16, 2019   feeding supplement (GLUCERNA SHAKE) Liqd Take 237 mLs by  mouth 3 (three) times daily between meals.   nutrition supplement (JUVEN) Pack Take 1 packet by mouth 2 (two) times daily between meals.   gabapentin 100 MG capsule Commonly known as: NEURONTIN Take 2 capsules (200 mg total) by mouth 3 (three) times daily.   insulin aspart 100 UNIT/ML injection Commonly known as: novoLOG Inject 0-20 Units into the skin every 4 (four) hours. CBG < 70: Implement Hypoglycemia Standing Orders and refer to Hypoglycemia Standing Orders sidebar report  CBG 70 - 120: 0 units  CBG 121 - 150: 3 units  CBG 151 - 200: 4 units  CBG 201 - 250: 7 units  CBG 251 - 300: 11 units  CBG 301 - 350: 15 units  CBG 351 - 400: 20 units  CBG > 400 call MD and obtain STAT lab verification Replaces: NovoLOG FlexPen 100 UNIT/ML FlexPen   insulin detemir 100 UNIT/ML injection Commonly known as: LEVEMIR Inject 0.33 mLs (33 Units total) into the skin every 12 (twelve) hours.   ipratropium-albuterol 0.5-2.5 (3) MG/3ML Soln Commonly known as: DUONEB Take 3 mLs by nebulization every 6 (six) hours as needed.   melatonin 3 MG Tabs tablet Take 1 tablet (3 mg total) by mouth at bedtime.   metoprolol tartrate 25 MG tablet Commonly known as: LOPRESSOR Take 0.5 tablets (12.5 mg total) by mouth 2 (two) times daily.   multivitamin with minerals Tabs tablet Take 1 tablet by mouth daily. Start taking on: September 16, 2019   Oxycodone HCl 10 MG Tabs Take 1  tablet (10 mg total) by mouth every 6 (six) hours as needed for severe pain.   pantoprazole 40 MG tablet Commonly known as: PROTONIX Take 1 tablet (40 mg total) by mouth at bedtime.   QUEtiapine 100 MG tablet Commonly known as: SEROQUEL Take 1 tablet (100 mg total) by mouth 2 (two) times daily.   senna-docusate 8.6-50 MG tablet Commonly known as: Senokot-S Take 2 tablets by mouth 2 (two) times daily.   sodium chloride 0.9 % infusion Inject 250 mLs into the vein continuous.   sodium chloride flush 0.9 % Soln Commonly  known as: NS 10-40 mLs by Intracatheter route every 12 (twelve) hours.   sodium chloride flush 0.9 % Soln Commonly known as: NS 10-40 mLs by Intracatheter route as needed (flush).   sodium chloride HYPERTONIC 3 % nebulizer solution Take 4 mLs by nebulization 2 (two) times daily.   torsemide 10 MG tablet Commonly known as: DEMADEX Take 1 tablet (10 mg total) by mouth daily. Start taking on: September 16, 2019 What changed:   medication strength  how much to take            Discharge Care Instructions  (From admission, onward)         Start     Ordered   09/15/19 0000  Discharge wound care:       Comments: CUT SLIVER OF AQUACEL AND APPLY TO [place]   09/15/19 1114         No Known Allergies    The results of significant diagnostics from this hospitalization (including imaging, microbiology, ancillary and laboratory) are listed below for reference.    Significant Diagnostic Studies: DG CHEST PORT 1 VIEW  Result Date: 09/04/2019 CLINICAL DATA:  Respiratory distress with tracheostomy tube in place. EXAM: PORTABLE CHEST 1 VIEW COMPARISON:  08/28/2019 FINDINGS: The tracheostomy tube tip is above the carina. Right arm PICC line tip is in the cavoatrial junction. There is a feeding tube with tip below the level of the GE junction. Diminished aeration to the left lung base is again noted which may represent atelectasis or airspace disease. IMPRESSION: 1. Satisfactory position of support apparatus. 2. No change in aeration to the left lung base. Electronically Signed   By: Kerby Moors M.D.   On: 09/04/2019 18:43   DG CHEST PORT 1 VIEW  Result Date: 08/28/2019 CLINICAL DATA:  Hypoxia. EXAM: PORTABLE CHEST 1 VIEW COMPARISON:  Single-view of the chest 08/19/2019. FINDINGS: Endotracheal tube and feeding tube remain in place. Left basilar airspace disease seen on the prior examination has improved. The right lung is clear. Heart size is upper  normal. No pneumothorax or pleural effusion. IMPRESSION: ET tube and feeding tube remain in good position. Improved left basilar airspace disease. Electronically Signed   By: Inge Rise M.D.   On: 08/28/2019 13:28   DG Chest Port 1 View  Result Date: 08/19/2019 CLINICAL DATA:  59 year old male with respiratory failure currently intubated EXAM: PORTABLE CHEST 1 VIEW COMPARISON:  Prior chest x-ray 07/19/2019 FINDINGS: The patient remains intubated. The tip of the endotracheal tube is 3.4 cm above the carina. An enteric feeding tube is present but incompletely imaged. The tube passes below the diaphragm and is presumably within the stomach or proximal small bowel. Slightly in proved inspiratory volumes with decreasing right perihilar and right basilar atelectasis. Persistent dense left basilar opacity which remains nonspecific. Background interstitial prominence and bronchitic changes are similar compared to prior. No pneumothorax. IMPRESSION: 1.  Slightly improved inspiratory volumes with decreasing right perihilar and right basilar atelectasis. 2. Persistent left greater than right bibasilar airspace opacities which may reflect atelectasis and/or infiltrate. 3. Stable and satisfactory support apparatus. Electronically Signed   By: Jacqulynn Cadet M.D.   On: 08/19/2019 10:25   DG CHEST PORT 1 VIEW  Result Date: 08/18/2019 CLINICAL DATA:  Intubation. EXAM: PORTABLE CHEST 1 VIEW COMPARISON:  Chest x-ray 08/16/2019 FINDINGS: Tracheostomy tube was removed. Endotracheal tube terminates 2 cm above the carina. NG tube courses off the inferior border the film. Heart is enlarged. Bilateral pleural effusions have increased, left greater than right. Bibasilar airspace disease noted. Interstitial edema is present. IMPRESSION: 1. Increasing bilateral pleural effusions, left greater than right. 2. Bibasilar airspace disease likely reflects atelectasis. Infection is not excluded. 3. Cardiomegaly with interstitial  edema. Electronically Signed   By: San Morelle M.D.   On: 08/18/2019 04:20   DG Abd Portable 1V  Result Date: 08/30/2019 CLINICAL DATA:  Feeding tube placement EXAM: PORTABLE ABDOMEN - 1 VIEW COMPARISON:  08/04/2019 FINDINGS: Nasoenteric feeding tube tip is seen within the gastric antrum. The abdominal gas pattern is nonspecific due to a paucity of intra-abdominal gas. Right flank and pelvis excluded from view. IMPRESSION: Nasoenteric feeding tube tip is within the gastric antrum. Electronically Signed   By: Fidela Salisbury MD   On: 08/30/2019 19:29   DG Swallowing Func-Speech Pathology  Result Date: 09/04/2019 Objective Swallowing Evaluation: Type of Study: MBS-Modified Barium Swallow Study  Patient Details Name: RAWAD BOCHICCHIO MRN: [id number] Date of Birth: Feb 27, 1960 Today's Date: 09/04/2019 Time: SLP Start Time (ACUTE ONLY): 40 -SLP Stop Time (ACUTE ONLY): 1332 SLP Time Calculation (min) (ACUTE ONLY): 12 min Past Medical History: Past Medical History: Diagnosis Date  Asthma   Chronic respiratory failure with hypoxia ([place]) 07/09/2017  COPD (chronic obstructive pulmonary disease) (HCC)   Diabetes mellitus without complication ([place])   Difficult intubation   Hypertension   Shortness of breath dyspnea   Sleep apnea  Past Surgical History: Past Surgical History: Procedure Laterality Date  IR GASTROSTOMY TUBE MOD SED  07/05/2017  THYROIDECTOMY Left 08/30/2019  Procedure: THYROIDECTOMY CERVICAL LIPECTOMY;  Surgeon: Izora Gala, MD;  Location: [place];  Service: ENT;  Laterality: Left;  NEEDS RNFA PLEASE  TRACHEOSTOMY TUBE PLACEMENT N/A 06/17/2017  Procedure: TRACHEOSTOMY;  Surgeon: Leta Baptist, MD;  Location: [place];  Service: ENT;  Laterality: N/A;  TRACHEOSTOMY TUBE PLACEMENT N/A 08/14/2019  Procedure: TRACHEOSTOMY;  Surgeon: Izora Gala, MD;  Location: [place];  Service: ENT;  Laterality: N/A;  TRACHEOSTOMY TUBE PLACEMENT N/A 08/30/2019  Procedure: TRACHEOSTOMY  REVISION;  Surgeon: Izora Gala, MD;   Location: [place];  Service: ENT;  Laterality: N/A; HPI: 59 year old male with baseline chronic respiratory failure on the basis of severe OHS/OSA complicated further by significant obstructive disease in the setting of COPD.  Presents with acute on chronic hypoxic and hypercarbic respiratory failure. Has required mechanical ventilation for most of the hospitalization, extubated on 7/16, had stridor, unsuccessful re-intubation, had emergent tracheostomy which was difficult.  On 7/24 he became agitated and his tracheostomy dislodged, required emergent re-intubation with following failed tracheostomy with cuff leak, now s/p tracheostomy revision with total thyroidectomy and cervical lipectomy.  Pt has a hx of a trach and oropharyngeal dysphagia in 2019 with two FEES completed during that admission.  Subjective: Pt was encountered awake/alert with trach collar Assessment / Plan / Recommendation CHL IP CLINICAL IMPRESSIONS 09/04/2019 Clinical Impression Pt demonstrates adequate airway protection during swallow assessment,  only impairment is mild vallecular packing during mastication. PMSV was in place. One instance of flash penetration seen between swallows, likely from oral residue transited to pharynx. Pt is able to resume a regular diet and thin liquids but must wear PMSV during oral intake. No SLP f/u needed for swallowing.  SLP Visit Diagnosis Dysphagia, unspecified (R13.10) Attention and concentration deficit following -- Frontal lobe and executive function deficit following -- Impact on safety and function Mild aspiration risk   CHL IP TREATMENT RECOMMENDATION 09/04/2019 Treatment Recommendations No treatment recommended at this time   Prognosis 09/02/2019 Prognosis for Safe Diet Advancement Fair Barriers to Reach Goals Severity of deficits Barriers/Prognosis Comment -- CHL IP DIET RECOMMENDATION 09/04/2019 SLP Diet Recommendations Regular solids;Thin liquid Liquid Administration via Cup;Straw Medication  Administration Whole meds with liquid Compensations Slow rate;Small sips/bites Postural Changes Seated upright at 90 degrees;Remain semi-upright after after feeds/meals (Comment)   CHL IP OTHER RECOMMENDATIONS 09/04/2019 Recommended Consults -- Oral Care Recommendations -- Other Recommendations Place PMSV during PO intake   CHL IP FOLLOW UP RECOMMENDATIONS 09/02/2019 Follow up Recommendations LTACH;Inpatient Rehab   CHL IP FREQUENCY AND DURATION 09/02/2019 Speech Therapy Frequency (ACUTE ONLY) min 2x/week Treatment Duration 2 weeks      CHL IP ORAL PHASE 09/04/2019 Oral Phase WFL Oral - Pudding Teaspoon -- Oral - Pudding Cup -- Oral - Honey Teaspoon -- Oral - Honey Cup -- Oral - Nectar Teaspoon -- Oral - Nectar Cup -- Oral - Nectar Straw -- Oral - Thin Teaspoon -- Oral - Thin Cup -- Oral - Thin Straw -- Oral - Puree -- Oral - Mech Soft -- Oral - Regular -- Oral - Multi-Consistency -- Oral - Pill -- Oral Phase - Comment --  CHL IP PHARYNGEAL PHASE 09/04/2019 Pharyngeal Phase WFL Pharyngeal- Pudding Teaspoon -- Pharyngeal -- Pharyngeal- Pudding Cup -- Pharyngeal -- Pharyngeal- Honey Teaspoon -- Pharyngeal -- Pharyngeal- Honey Cup -- Pharyngeal -- Pharyngeal- Nectar Teaspoon -- Pharyngeal -- Pharyngeal- Nectar Cup -- Pharyngeal -- Pharyngeal- Nectar Straw -- Pharyngeal -- Pharyngeal- Thin Teaspoon -- Pharyngeal -- Pharyngeal- Thin Cup -- Pharyngeal -- Pharyngeal- Thin Straw -- Pharyngeal -- Pharyngeal- Puree -- Pharyngeal -- Pharyngeal- Mechanical Soft -- Pharyngeal -- Pharyngeal- Regular -- Pharyngeal -- Pharyngeal- Multi-consistency -- Pharyngeal -- Pharyngeal- Pill -- Pharyngeal -- Pharyngeal Comment --  CHL IP CERVICAL ESOPHAGEAL PHASE 09/04/2019 Cervical Esophageal Phase WFL Pudding Teaspoon -- Pudding Cup -- Honey Teaspoon -- Honey Cup -- Nectar Teaspoon -- Nectar Cup -- Nectar Straw -- Thin Teaspoon -- Thin Cup -- Thin Straw -- Puree -- Mechanical Soft -- Regular -- Multi-consistency -- Pill -- Cervical Esophageal  Comment -- Herbie Baltimore, MA CCC-SLP Acute Rehabilitation Services Pager [phone] Office [phone] Lynann Beaver 09/04/2019, 1:59 PM               Microbiology: No results found for this or any previous visit (from the past 240 hour(s)).   Labs: Basic Metabolic Panel: Recent Labs  Lab 09/11/19 0515  NA 143  K 3.6  CL 100  CO2 33*  GLUCOSE 121*  BUN 25*  CREATININE 0.88  CALCIUM 9.1   Liver Function Tests: No results for input(s): AST, ALT, ALKPHOS, BILITOT, PROT, ALBUMIN in the last 168 hours. No results for input(s): LIPASE, AMYLASE in the last 168 hours. No results for input(s): AMMONIA in the last 168 hours. CBC: Recent Labs  Lab 09/11/19 0515  WBC 5.3  HGB 9.7*  HCT 32.2*  MCV 90.7  PLT 248   Cardiac Enzymes: No  results for input(s): CKTOTAL, CKMB, CKMBINDEX, TROPONINI in the last 168 hours. BNP: BNP (last 3 results) Recent Labs    08/01/19 0528 08/02/19 0355 08/03/19 0348  BNP 85.2 98.8 90.9    ProBNP (last 3 results) No results for input(s): PROBNP in the last 8760 hours.  CBG: Recent Labs  Lab 09/14/19 2015 09/14/19 2333 09/15/19 0337 09/15/19 0742 09/15/19 1100  GLUCAP 177* 102* 113* 101* 169*       Signed:  Georgette Shell ANP [facility] 09/15/2019, 11:50 AM

## 2019-09-15 NOTE — Progress Notes (Signed)
Izora Ribas, MD  Physician  Physical Medicine and Rehabilitation  PMR Pre-admission      Signed  Date of Service:  09/15/2019 11:39 AM      Related encounter: ED to Hosp-Admission (Current) from 07/30/2019 in [place]       Show:Clear all '[x]' Manual'[x]' Template'[]' Copied  Added by: '[x]' Cristina Gong, RN'[x]' Raulkar, Clide Deutscher, MD  '[]' Hover for details PMR Admission Coordinator Pre-Admission Assessment   Patient: Timothy Le is an 59 y.o., male MRN: [id number] DOB: 23-Apr-1960 Height: 6' (182.9 cm) Weight: (!) 140.4 kg (scale B)   Insurance Information HMO:     PPO:      PCP:      IPA:      80/20:      OTHER:  PRIMARY: Medicaid of [place]      Policy#: [id number]      Subscriber: pt Benefits:  Phone #: passport one online e     Name: 8/27 Eff. Date: active    [place]   Financial Counselor:       Phone#:    The Engineer, [employer]" for patients in Inpatient Rehabilitation Facilities with attached "Privacy Act [place] Records" was provided and verbally reviewed with: N/A   Emergency Contact Information         Contact Information     Name Relation Home Work Peever, [name] Sister     [phone]    Tiandre, Teall     [phone]    Nelwyn Salisbury Niece     [phone]    Arad, Burston Spouse     [phone]         Current Medical History  Patient Admitting Diagnosis: Debility   History of Present Illness: 59 year old male with medical history of type 2 diabetes, HTN, COPD on home oxygen, morbid obesity, OSA, history of tracheal stenosis and prior tracheostomy who presented on 07/30/2019 with worsening SOB and being off his CPAP for 2 weeks ( likely broken). Was found to have acute on chronic hypoxic/hypercapnic respiratory failure due to OHS/OSA complicate by large thyroid goiter. Patient required intubation upon presentation to the ED due to minimally responsive. Hospital course  complicated by failed extubation related to acute respiratory failure and failed attempts of reintubation requiring trach on 7/16. 7/24 trach became dislodges in the setting of agitation and required emergent reintubation followed by trach revision with ENT on 7/26. 8/9 mucous plug resulting in bradycardia and CODE BLUE. Total thyroidectomy and cervical lipectomy and trach revision with ENT on 8/11.    Bronchoscopy performed by PCCM on 8/18 with no evidence of obstructive mucus or debris and clear visualization of the carina.   Tolerating 28 % trach collar with nebulized hypertonic saline . PCCM to transition to cuff less trach ( same style trach patient used previously at home prior to outpatient decannulation).  Patient's medical record from [facility]  has been reviewed by the rehabilitation admission coordinator and physician.   Past Medical History      Past Medical History:  Diagnosis Date  . Asthma    . Chronic respiratory failure with hypoxia ([place]) 07/09/2017  . COPD (chronic obstructive pulmonary disease) ([place])    . Diabetes mellitus without complication ([place])    . Difficult intubation    . Hypertension    . Shortness of breath dyspnea    . Sleep apnea        Family  History   family history includes Asthma in his sister and another family member; Diabetes type II in his sister; Hypertension in his sister.   Prior Rehab/Hospitalizations Has the patient had prior rehab or hospitalizations prior to admission? Yes CIR 2019. Went to [facility] for 3 1/2 months then home with trach. Did not go home from [facility] for he was in second level apartment and would not learn trach care at that time. After SNF he went home and managed trach at Mod I level up until decannulation.   Has the patient had major surgery during 100 days prior to admission? Yes              Current Medications   Current Facility-Administered Medications:  .  Place/Maintain arterial line, ,  , Until Discontinued **AND** 0.9 %  sodium chloride infusion, , Intra-arterial, PRN, Salvadore Dom E, NP .  0.9 %  sodium chloride infusion, , Intravenous, Continuous, Erick Colace, NP, Stopped at 08/30/19 1427 .  acetaminophen (TYLENOL) tablet 650 mg, 650 mg, Oral, Q6H PRN, Oretha Milch D, MD .  allopurinol (ZYLOPRIM) tablet 100 mg, 100 mg, Oral, Daily, Dahal, Binaya, MD, 100 mg at 09/15/19 1015 .  alum & mag hydroxide-simeth (MAALOX/MYLANTA) 200-200-20 MG/5ML suspension 30 mL, 30 mL, Oral, Q4H PRN, Dahal, Binaya, MD, 30 mL at 09/05/19 2155 .  atorvastatin (LIPITOR) tablet 10 mg, 10 mg, Oral, Daily, Hunsucker, Bonna Gains, MD, 10 mg at 09/15/19 1014 .  chlorhexidine gluconate (MEDLINE KIT) (PERIDEX) 0.12 % solution 15 mL, 15 mL, Mouth Rinse, BID, Chand, Sudham, MD, 15 mL at 09/14/19 2022 .  Chlorhexidine Gluconate Cloth 2 % PADS 6 each, 6 each, Topical, Daily, Icard, Bradley L, DO, 6 each at 09/15/19 0516 .  dextromethorphan-guaiFENesin (MUCINEX DM) 30-600 MG per 12 hr tablet 1 tablet, 1 tablet, Oral, BID, Oretha Milch D, MD, 1 tablet at 09/15/19 1014 .  enoxaparin (LOVENOX) injection 75 mg, 0.5 mg/kg, Subcutaneous, Q24H, Dahal, Binaya, MD, 75 mg at 09/15/19 1015 .  feeding supplement (GLUCERNA SHAKE) (GLUCERNA SHAKE) liquid 237 mL, 237 mL, Oral, TID BM, Erin Hearing L, NP .  gabapentin (NEURONTIN) capsule 200 mg, 200 mg, Oral, TID, Dahal, Binaya, MD, 200 mg at 09/15/19 1014 .  hydrALAZINE (APRESOLINE) injection 10-40 mg, 10-40 mg, Intravenous, Q4H PRN, Erick Colace, NP, 20 mg at 08/16/19 0730 .  insulin aspart (novoLOG) injection 0-20 Units, 0-20 Units, Subcutaneous, Q4H, Audria Nine, DO, 4 Units at 09/15/19 1138 .  insulin detemir (LEVEMIR) injection 33 Units, 33 Units, Subcutaneous, Q12H, Samella Parr, NP, 33 Units at 09/15/19 0516 .  ipratropium-albuterol (DUONEB) 0.5-2.5 (3) MG/3ML nebulizer solution 3 mL, 3 mL, Nebulization, Q6H PRN, Jacky Kindle, MD, 3 mL at 09/13/19  0805 .  melatonin tablet 3 mg, 3 mg, Oral, QHS, Hunsucker, Bonna Gains, MD, 3 mg at 09/14/19 2142 .  metoprolol tartrate (LOPRESSOR) tablet 12.5 mg, 12.5 mg, Oral, BID, Dahal, Binaya, MD, 12.5 mg at 09/15/19 1013 .  multivitamin with minerals tablet 1 tablet, 1 tablet, Oral, Daily, Dahal, Binaya, MD, 1 tablet at 09/15/19 1014 .  nutrition supplement (JUVEN) (JUVEN) powder packet 1 packet, 1 packet, Oral, BID BM, Dahal, Marlowe Aschoff, MD, 1 packet at 09/15/19 1014 .  oxyCODONE (Oxy IR/ROXICODONE) immediate release tablet 10 mg, 10 mg, Oral, Q6H PRN, [place], Yujing Z, RPH, 10 mg at 09/14/19 2142 .  pantoprazole (PROTONIX) EC tablet 40 mg, 40 mg, Oral, QHS, Priscella Mann, RPH, 40 mg at 09/14/19 2142 .  polyethylene glycol (MIRALAX / GLYCOLAX) packet 17 g, 17 g, Per Tube, Daily, McQuaid, Douglas B, MD, 17 g at 09/15/19 1014 .  QUEtiapine (SEROQUEL) tablet 100 mg, 100 mg, Oral, BID, Hunsucker, Bonna Gains, MD, 100 mg at 09/15/19 1014 .  senna-docusate (Senokot-S) tablet 2 tablet, 2 tablet, Oral, BID, Oretha Milch D, MD, 2 tablet at 09/15/19 1014 .  sertraline (ZOLOFT) tablet 100 mg, 100 mg, Oral, Daily, Hunsucker, Bonna Gains, MD, 100 mg at 09/15/19 1014 .  sodium chloride flush (NS) 0.9 % injection 10-40 mL, 10-40 mL, Intracatheter, Q12H, Chand, Sudham, MD, 10 mL at 09/15/19 1015 .  sodium chloride flush (NS) 0.9 % injection 10-40 mL, 10-40 mL, Intracatheter, PRN, Jacky Kindle, MD, 10 mL at 08/01/19 2135 .  sodium chloride HYPERTONIC 3 % nebulizer solution 4 mL, 4 mL, Nebulization, BID, Hunsucker, Bonna Gains, MD, 4 mL at 09/14/19 1947 .  torsemide (DEMADEX) tablet 10 mg, 10 mg, Oral, Daily, Dahal, Binaya, MD, 10 mg at 09/15/19 1013   Patients Current Diet:  Diet Order                      Diet Carb Modified              DIET SOFT Room service appropriate? Yes; Fluid consistency: Thin; Fluid restriction: 1500 mL Fluid  Diet effective now                      Precautions /  Restrictions Precautions Precautions: Fall Precaution Comments: trach collar, PMV Restrictions Weight Bearing Restrictions: No    Has the patient had 2 or more falls or a fall with injury in the past year? No   Prior Activity Level Limited Community (1-2x/wk): Mod I with AD   Prior Functional Level Self Care: Did the patient need help bathing, dressing, using the toilet or eating? Independent   Indoor Mobility: Did the patient need assistance with walking from room to room (with or without device)? Independent   Stairs: Did the patient need assistance with internal or external stairs (with or without device)? Independent   Functional Cognition: Did the patient need help planning regular tasks such as shopping or remembering to take medications? Independent   Home Assistive Devices / Equipment Home Assistive Devices/Equipment: CPAP Home Equipment: Toilet riser, Shower seat, Environmental consultant - 2 wheels, Cane - single point   Prior Device Use: Indicate devices/aids used by the patient prior to current illness, exacerbation or injury? cane   Current Functional Level Cognition   Overall Cognitive Status: Impaired/Different from baseline Orientation Level: Oriented X4 Safety/Judgement: Decreased awareness of safety, Decreased awareness of deficits General Comments: Improved safety/judgement from prior session but still some deficits    Extremity Assessment (includes Sensation/Coordination)   Upper Extremity Assessment: Defer to OT evaluation  Lower Extremity Assessment: RLE deficits/detail, LLE deficits/detail RLE Deficits / Details: grossly 3-/5 LLE Deficits / Details: grossly 3-/5     ADLs   Overall ADL's : Needs assistance/impaired Eating/Feeding: Modified independent, Sitting Eating/Feeding Details (indicate cue type and reason): setup with lunch tray end of session Grooming: Wash/dry face, Supervision/safety, Sitting Grooming Details (indicate cue type and reason): pt reports  typically performs in sitting at home (on shower chair at  sink)  Upper Body Bathing: Moderate assistance, Sitting Lower Body Bathing: Moderate assistance, +2 for physical assistance, Sitting/lateral leans Lower Body Bathing Details (indicate cue type and reason): Normally gets socks on sitting on EOB and turns sideways, he can cross RLE  over left do to this but cannot due left over right Upper Body Dressing : Moderate assistance, Sitting Lower Body Dressing: Moderate assistance, +2 for physical assistance, Sitting/lateral leans Functional mobility during ADLs: Minimal assistance, Rolling walker, +2 for safety/equipment General ADL Comments: Pt completed bed mobility, sat EOB about 20 minutes. Attempted standing from EOB, squat-pivot transfer with +2 and stedy. Pt with R lateral lean. Ultimately unable to clear hips R>L from EOB. Pt also reporting lightheadedness on standing attempt with stedy. Pt returned to supine and bp assessed.     Mobility   Overal bed mobility: Needs Assistance Bed Mobility: Supine to Sit Rolling: Supervision Sidelying to sit: Supervision Supine to sit: Min guard Sit to supine: Mod assist, +2 for physical assistance General bed mobility comments: for safety/lines      Transfers   Overall transfer level: Needs assistance Equipment used: Rolling walker (2 wheeled) Transfer via Lift Equipment: Maximove Transfers: Sit to/from Stand Sit to Stand: Min assist, Min guard, +2 safety/equipment  Lateral/Scoot Transfers: Mod assist (level surfaces (bed to drop arm recliner)) General transfer comment: Assist to bring hips up on initial sit to stand from bed but progressed to min guard from recliner on other 2 sit to stands     Ambulation / Gait / Stairs / Wheelchair Mobility   Ambulation/Gait Ambulation/Gait assistance: Min assist, +2 safety/equipment Gait Distance (Feet): 45 Feet (x 2) Assistive device: Rolling walker (2 wheeled) Gait Pattern/deviations: Step-through  pattern, Decreased stride length, Trunk flexed General Gait Details: Assist for balance and support. Verbal cues not to get too close to front of walker. Legs tremulous.  Gait velocity: decr Gait velocity interpretation: <1.8 ft/sec, indicate of risk for recurrent falls     Posture / Balance Dynamic Sitting Balance Sitting balance - Comments: Able to sit EOB without support. Balance Overall balance assessment: Needs assistance Sitting-balance support: No upper extremity supported, Feet supported Sitting balance-Leahy Scale: Fair Sitting balance - Comments: Able to sit EOB without support. Postural control: Right lateral lean Standing balance support: During functional activity Standing balance-Leahy Scale: Poor Standing balance comment: walker and min assist for static standing     Special needs/care consideration Daily visitor TBD 8/27 to transition to 5 proximal XLT cuff less trach that he had previously at home. Followed by PCCM, Marni Griffon as an outpatient. 28%TC on 8/27. Hgb A1c 14.9    Previous Home Environment  Living Arrangements: Alone  Lives With: Alone Available Help at Discharge: Family, Available PRN/intermittently Type of Home: Apartment Home Layout: One level Home Access: Level entry Bathroom Shower/Tub: Chiropodist: Handicapped height Bathroom Accessibility: Yes How Accessible: Accessible via walker [place]: No   Discharge Living Setting Plans for Discharge Living Setting: Patient's home, Apartment, Alone Type of Home at Discharge: Apartment Discharge Home Layout: One level Discharge Home Access: Level entry Discharge Bathroom Shower/Tub: Tub/shower unit Discharge Bathroom Toilet: Handicapped height Discharge Bathroom Accessibility: Yes How Accessible: Accessible via walker Does the patient have any problems obtaining your medications?: No   Social/Family/Support Systems Patient Roles: Parent (has adult son and his legally  separated from wife for 33 yea) Contact Information: sister, [name] Designer, jewellery Anticipated Caregiver: sister twice weekly only; she works Anticipated Ambulance person Information: per chart Ability/Limitations of Caregiver: diane works 8 until 5; can take calls after 2 pm during her lunch Caregiver Availability: Intermittent Discharge Plan Discussed with Primary Caregiver: Yes Is Caregiver In Agreement with Plan?: Yes Does Caregiver/Family have Issues with Lodging/Transportation while Pt is in Rehab?:  No   Goals Patient/Family Goal for Rehab: Mod I with PT and OT Expected length of stay: ELOS 10 to 12 days Additional Information: Patietn has had same trach before and cared for it without problems Pt/Family Agrees to Admission and willing to participate: Yes Program Orientation Provided & Reviewed with Pt/Caregiver Including Roles  & Responsibilities: Yes   Decrease burden of Care through IP rehab admission: n/a   Possible need for SNF placement upon discharge: not anticipated. He does not want SNF again for he would have to give up his monthly check   Patient Condition: I have reviewed medical records from [facility] , spoken with CM, and patient and family member. I met with patient at the bedside for inpatient rehabilitation assessment.  Patient will benefit from ongoing PT and OT, can actively participate in 3 hours of therapy a day 5 days of the week, and can make measurable gains during the admission.  Patient will also benefit from the coordinated team approach during an Inpatient Acute Rehabilitation admission.  The patient will receive intensive therapy as well as Rehabilitation physician, nursing, social worker, and care management interventions.  Due to bladder management, bowel management, safety, skin/wound care, disease management, medication administration, pain management and patient education the patient requires 24 hour a day rehabilitation nursing.  The patient is  currently min assist with mobility and basic ADLs.  Discharge setting and therapy post discharge at home with home health is anticipated.  Patient has agreed to participate in the Acute Inpatient Rehabilitation Program and will admit today.   Preadmission Screen Completed By:  Cleatrice Burke, 09/15/2019 11:39 AM ______________________________________________________________________   Discussed status with Dr. Ranell Patrick on 09/15/19 at 12:09pm and received approval for admission today.   Admission Coordinator:  Cleatrice Burke, RN, time 12:09pm/Date 09/15/19    Assessment/Plan: Diagnosis: 1. Does the need for close, 24 hr/day Medical supervision in concert with the patient's rehab needs make it unreasonable for this patient to be served in a less intensive setting? Yes 2. Co-Morbidities requiring supervision/potential complications: type 2 DM, OSA< morbid obeisty (BMI 41.98), COPD on oxygen, tracheal stenosis requiring tracheostomy, s/p intubation, acute on chronic respiratory failure, bradycardia, mucus plug, thyroidectomy, cervical lipectomy, post-op anemia.  3. Due to bladder management, bowel management, safety, skin/wound care, disease management, medication administration, pain management and patient education, does the patient require 24 hr/day rehab nursing? Yes 4. Does the patient require coordinated care of a physician, rehab nurse, PT, OT, and SLP to address physical and functional deficits in the context of the above medical diagnosis(es)? Yes Addressing deficits in the following areas: balance, endurance, locomotion, strength, transferring, bowel/bladder control, bathing, dressing, feeding, grooming, toileting and psychosocial support 5. Can the patient actively participate in an intensive therapy program of at least 3 hrs of therapy 5 days a week? Yes 6. The potential for patient to make measurable gains while on inpatient rehab is excellent 7. Anticipated functional outcomes  upon discharge from inpatient rehab: modified independent PT, modified independent OT, independent SLP 8. Estimated rehab length of stay to reach the above functional goals is: 7-8 days 9. Anticipated discharge destination: Home 10. Overall Rehab/Functional Prognosis: excellent     MD Signature: Leeroy Cha, MD        Revision History                     Note Details  Author Izora Ribas, MD File Time 09/15/2019 12:13 PM  Author Type Physician  Status Signed  Last Editor Raulkar, Clide Deutscher, MD Service Physical Medicine and Rehabilitation

## 2019-09-15 NOTE — Progress Notes (Signed)
Inpatient Rehabilitation Admissions Coordinator  I met with patient at bedside. He is in agreement to Cir admit today after Trach change. Cir bed is available and I have notified MD, acute team as well as TOC. I will make the arrangements to admit today.  Danne Baxter, RN, MSN Rehab Admissions Coordinator [phone] 09/15/2019 10:30 AM

## 2019-09-15 NOTE — TOC Initial Note (Signed)
Transition of Care [facility]) - Initial/Assessment Note    Patient Details  Name: Timothy Le MRN: [id number] Date of Birth: 1960-06-03  [facility] CM/SW Contact:    Zenon Mayo, RN Phone Number: 09/15/2019, 12:22 PM  Clinical Narrative:                 Patient is for dc to CIR today.  Expected Discharge Plan: Skilled Nursing Facility Barriers to Discharge: No Barriers Identified   Patient Goals and CMS Choice        Expected Discharge Plan and Services Expected Discharge Plan: [place]   Discharge Planning Services: CM Consult   Living arrangements for the past 2 months: Apartment Expected Discharge Date: 09/15/19                 DME Agency: NA       HH Arranged: NA          Prior Living Arrangements/Services Living arrangements for the past 2 months: Apartment Lives with:: Self Patient language and need for interpreter reviewed:: Yes Do you feel safe going back to the place where you live?: Yes      Need for Family Participation in Patient Care: Yes (Comment) Care giver support system in place?: Yes (comment)   Criminal Activity/Legal Involvement Pertinent to Current Situation/Hospitalization: No - Comment as needed  Activities of Daily Living Home Assistive Devices/Equipment: CPAP ADL Screening (condition at time of admission) Patient's cognitive ability adequate to safely complete daily activities?: Yes Is the patient deaf or have difficulty hearing?: No Does the patient have difficulty seeing, even when wearing glasses/contacts?: No Does the patient have difficulty concentrating, remembering, or making decisions?: No Patient able to express need for assistance with ADLs?: Yes Does the patient have difficulty dressing or bathing?: Yes Independently performs ADLs?: No Communication: Needs assistance Is this a change from baseline?: Change from baseline, expected to last >3 days Dressing (OT): Needs  assistance Is this a change from baseline?: Change from baseline, expected to last >3 days Grooming: Needs assistance Is this a change from baseline?: Change from baseline, expected to last >3 days Feeding: Needs assistance Is this a change from baseline?: Change from baseline, expected to last >3 days Bathing: Needs assistance Is this a change from baseline?: Change from baseline, expected to last >3 days Toileting: Needs assistance Is this a change from baseline?: Change from baseline, expected to last >3days In/Out Bed: Needs assistance Is this a change from baseline?: Change from baseline, expected to last >3 days Walks in Home: Needs assistance Is this a change from baseline?: Change from baseline, expected to last >3 days Does the patient have difficulty walking or climbing stairs?: Yes Weakness of Legs: None Weakness of Arms/Hands: None  Permission Sought/Granted                  Emotional Assessment Appearance:: Appears stated age Attitude/Demeanor/Rapport: Engaged Affect (typically observed): Appropriate Orientation: : Oriented to Self, Oriented to Place, Oriented to  Time, Oriented to Situation Alcohol / Substance Use: Not Applicable Psych Involvement: No (comment)  Admission diagnosis:  Acute respiratory failure ([place]) [J96.00] Acute pulmonary edema ([place]) [J81.0] Acute on chronic respiratory failure (HCC) [J96.20] Acute respiratory failure with hypoxia and hypercapnia ([place]) [J96.01, J96.02] Patient Active Problem List   Diagnosis Date Noted  . Chronic diastolic CHF (congestive heart failure) ([place]) 09/07/2019  . Poorly controlled diabetes mellitus ([place]) 09/06/2019  . Acute airway obstruction   . Endotracheal tube present   .  Acute on chronic respiratory failure ([place]) 07/30/2019  . Acute respiratory failure ([place]) 07/30/2019  . Acute respiratory failure with hypoxia and hypercapnia (HCC)   . Nodule of soft tissue 01/24/2019  . Benign essential HTN   .  Hyperlipidemia   . Tracheostomy in place [facility])   . Physical deconditioning 07/15/2017  . Chronic respiratory failure with hypoxia ([place]) 07/09/2017  . Acute pulmonary edema (HCC)   . Decubitus ulcer 06/19/2017  . Difficult airway for intubation   . Obesity hypoventilation syndrome ([place]) 06/09/2017  . Seasonal and perennial allergic rhinitis 05/24/2016  . AKI (acute kidney injury) ([name]) 04/08/2015  . Hyperosmolar hyperglycemic state (HHS) ([place]) 03/28/2015  . Type 2 diabetes mellitus with hyperosmolar nonketotic hyperglycemia ([place]) 03/28/2015  . COPD mixed type ([place]) 12/27/2014  . Severe obesity (BMI >= 40) ([place]) 12/27/2014  . Diabetes mellitus type 2, controlled ([place]) 12/27/2014    Class: Chronic  . OSA (obstructive sleep apnea) 07/29/2013  . Peripheral edema 07/29/2013   PCP:  Kerin Perna, NP Pharmacy:   [id number] [place], [place] [address] [place] [address] Phone: [phone] Fax: [phone]  [facility] [place], [place] [address] [place] [address] Phone: [phone] Fax: [phone]     Social Determinants of Health (SDOH) Interventions    Readmission Risk Interventions Readmission Risk Prevention Plan 09/15/2019  Transportation Screening Complete  Medication Review ([place]) Complete  PCP or Specialist appointment within 3-5 days of discharge Complete  HRI or Home Care Consult Complete  SW Recovery Care/Counseling Consult Complete  Palliative Care Screening Not [place] Complete  Some recent data might be hidden

## 2019-09-15 NOTE — Progress Notes (Signed)
NAME:  Timothy Le, MRN:  [id number], DOB:  [birth year], LOS: 90 ADMISSION DATE:  07/30/2019, CONSULTATION DATE: 09/15/19 REFERRING MD:  Georgette Shell, MD, CHIEF COMPLAINT:  "can't breathe"  Brief History   Patient is a 59 year old African-American male with PMH significant for DM 2, HTN, COPD on home oxygen, morbid obesity, obstructive sleep apnea. Patient presented to the ED on 07/30/2019 with worsening shortness of breath likely in the setting of acute exacerbation of heart failure subsequently intubated for acute on chronic hypoxemic/hypercarbic respiratory failure due to OHS/OSA found to have large thyroid goiter contributing s/p tracheostomy attempts with subsequent tracheostomy failure s/p thyroidectomy and secure trach 08/30/19 with ENT who is transferred to ICU 09/05/19 after acute episode of desaturation and bradycardia due to mucous plug.    Past Medical History   Past Medical History:  Diagnosis Date  . Asthma   . Chronic respiratory failure with hypoxia ([place]) 07/09/2017  . COPD (chronic obstructive pulmonary disease) ([place])   . Diabetes mellitus without complication ([place])   . Difficult intubation   . Hypertension   . Shortness of breath dyspnea   . Sleep apnea     Significant Hospital Events   7/16> he was extubated, had stridor, unsuccessful re-intubation, had emergent tracheostomy which was difficult. 7/23> underwent bronchoscopy because of mucous plug. 7/24> tracheostomy dislodged due to severe agitation; required emergent reintubation 7/26> tracheostomy revision with ENT 7/30> ETT due to cuff leak 8/09> bronch mucus plug resulting in bradycardia and code blue 8/11>total thyroidectomy and cervical lipectomy with suturing of skin flaps to to trachea for tracheostomy 8/14> neck drains removed by ENT.  Transferred out of ICU to hospitalist service on trach collar and Dobbhoff tube 8/16> modified barium swallow -patient showed adequate airway protection.  Switch  from Cortrak tube feeding to regular consistency diet. 8/18>> mucous plug, transferred to ICU  Consults:  pccm  Procedures:  PICC Thyroidectomy  Significant Diagnostic Tests:  As per EMR  Micro Data:  As per EMR  Antimicrobials:  n/a  Interim history/subjective:  Feels well  Objective   Blood pressure 129/80, pulse 80, temperature 98.2 F (36.8 C), temperature source Oral, resp. rate 20, height 6' (1.829 m), weight (Abnormal) 140.4 kg, SpO2 95 %.    FiO2 (%):  [28 %] 28 %   Intake/Output Summary (Last 24 hours) at 09/15/2019 1209 Last data filed at 09/15/2019 0354 Gross per 24 hour  Intake 240 ml  Output 2500 ml  Net -2260 ml   Filed Weights   09/10/19 0453 09/12/19 0345 09/15/19 0500  Weight: (Abnormal) 147 kg (Abnormal) 145.6 kg (Abnormal) 140.4 kg    Examination: General: This is a 59 year old black male well-known known to me as a previous tracheostomy patient now status post tracheostomy revision HEENT normocephalic atraumatic no jugular venous distention appreciated.  Now has size 5 proximal XLT tracheostomy in place he is able to phonate well with Passy-Muir valve the tracheostoma was evaluated and is unremarkable Pulmonary: Clear to auscultation excellent phonation strong cough Cardiac: Regular rate and rhythm without murmur rub or gallop Neuro awake oriented no focal deficits Extremities are warm and dry chronic venous stasis changes again appreciated  Resolved Hospital Problem list     Assessment & Plan:  1) Acute on chronic hypercarbic and hypoxemic respiratory failure: Felt to be due to OHS/OSA with more recent contribution to narrowed airway from large thyroid goiter.   2 ) 8/18 due to mucous plug that was resolved with inline suctioning. 3) Chronic diastolic  heart failure: Appears euvolemic at this time 4) Diabetes  Discussion I changed his trach to 5 prox xlt cuffless. This most closely matches the previous adjustable tracheostomy tube he had in  place.  He is also used to this model.  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  He had done so well in the past with managing his sleep apnea, making good lifestyle changes, being compliant with CPAP.  I am reluctant to close that door altogether for him  Plan Continue routine tracheostomy care  He will need to be discharged to home with the 5 proximal cuffless XLT tracheostomy  Please contact me prior to discharge and I will set up follow-up in the tracheostomy clinic   Best practice:  Per primary    Erick Colace ACNP-BC [place] Pager # [phone] OR # [phone] if no answer

## 2019-09-15 NOTE — H&P (Signed)
Physical Medicine and Rehabilitation Admission H&P    CC: Debility   HPI: Timothy Le is a 59 year old male with history of T2DM, OSA, morbid obesity-BMI 41.98, COPD,  tracheal stenosis and prior tracheostomy who was admitted on 07/30/19 with reports of BIPAP not working for 2 weeks,acute on chronic respiratory failure and was unresponsive requiring intubation. Hospital course significant for electrolyte abnormalities, hyperglycemia as well as inability to tolerate extubation requiring emergent tracheostomy by Dr. Grandville Silos.  ENT consulted for input due to large thyroid goiter and felt that patient would need a thyroidectomy prior to decannulation.  Trach did dislodge on 07/24 due to agitation requiring emergent reintubation and he underwent tracheostomy by Dr. Constance Holster on 07/26.  He has had issues with recurrent mucous plug and bradycardic arrest treated with ACLS protocol on 08/09.    He underwent thyroidectomy, cervical lipectomy and tracheostomy revision on 08/11.  He had significant bleeding postop requiring 2 units PRBC.  He was tolerated extubation and was started on regular diet by extubated and started on regular diet by 08/16.  Respiratory status stable and chronic diastolic CHF compensated--Cardura and metaxalone on home.  Gabapentin added to help manage neuropathic symptoms. Trach downsized to 5 XLT today.  Therapy ongoing and CIR recommended due to debility.   Review of Systems  Constitutional: Negative for chills and fever.  HENT: Negative for hearing loss and tinnitus.   Eyes: Negative for blurred vision and double vision.  Respiratory: Negative for cough and shortness of breath.   Cardiovascular: Negative for chest pain, palpitations and leg swelling.  Gastrointestinal: Negative for heartburn and nausea.  Genitourinary: Negative for dysuria and urgency.  Musculoskeletal: Negative for myalgias.  Skin: Negative for itching and rash.  Neurological: Positive for speech  change. Negative for dizziness and headaches.  Psychiatric/Behavioral: Negative for depression. The patient does not have insomnia.       Past Medical History:  Diagnosis Date  . Asthma   . Chronic respiratory failure with hypoxia ([place]) 07/09/2017  . COPD (chronic obstructive pulmonary disease) ([place])   . Diabetes mellitus without complication ([place])   . Difficult intubation   . Hypertension   . Shortness of breath dyspnea   . Sleep apnea     Past Surgical History:  Procedure Laterality Date  . IR GASTROSTOMY TUBE MOD SED  07/05/2017  . THYROIDECTOMY Left 08/30/2019   Procedure: THYROIDECTOMY CERVICAL LIPECTOMY;  Surgeon: Izora Gala, MD;  Location: [facility];  Service: ENT;  Laterality: Left;  NEEDS RNFA PLEASE  . TRACHEOSTOMY TUBE PLACEMENT N/A 06/17/2017   Procedure: TRACHEOSTOMY;  Surgeon: Leta Baptist, MD;  Location: [facility];  Service: ENT;  Laterality: N/A;  . TRACHEOSTOMY TUBE PLACEMENT N/A 08/14/2019   Procedure: TRACHEOSTOMY;  Surgeon: Izora Gala, MD;  Location: [facility];  Service: ENT;  Laterality: N/A;  . TRACHEOSTOMY TUBE PLACEMENT N/A 08/30/2019   Procedure: TRACHEOSTOMY  REVISION;  Surgeon: Izora Gala, MD;  Location: [facility];  Service: ENT;  Laterality: N/A;    Family History  Problem Relation Age of Onset  . Asthma Sister   . Asthma Other        nephew  . Hypertension Sister   . Diabetes type II Sister     Social History:  Lives alone. Disabled. He reports that he quit smoking about 6 years ago. His smoking use included cigarettes. He started smoking about 36 years ago. He has a 0.25 pack-year smoking history. He has never used smokeless tobacco. He drinks a  couple of beers/once a months. He does not use drugs.    Allergies: No Known Allergies    Medications Prior to Admission  Medication Sig Dispense Refill  . allopurinol (ZYLOPRIM) 100 MG tablet Take 100 mg by mouth daily.    Marland Kitchen atorvastatin (LIPITOR) 10 MG tablet Take 10 mg by mouth daily.    . budesonide-formoterol  (SYMBICORT) 160-4.5 MCG/ACT inhaler Inhale 2 puffs then rinse mouth, twice daiy (Patient taking differently: Inhale 2 puffs into the lungs 2 (two) times daily. Rinse mouth after use) 1 Inhaler 12  . desonide (DESOWEN) 0.05 % lotion Apply 1 application topically daily. Apply to face    . insulin aspart (NOVOLOG FLEXPEN) 100 UNIT/ML FlexPen Inject 8 Units into the skin 3 (three) times daily with meals. 15 mL 11  . insulin glargine (LANTUS) 100 unit/mL SOPN Inject 0.35 mLs (35 Units total) into the skin at bedtime. 10.5 mL 0  . ketoconazole (NIZORAL) 2 % cream Apply 1 application topically daily. Apply to wounds    . metFORMIN (GLUCOPHAGE) 1000 MG tablet Take 1 tablet (1,000 mg total) by mouth 2 (two) times daily with a meal. 60 tablet 0  . metolazone (ZAROXOLYN) 2.5 MG tablet Take 2.5 mg by mouth daily.    . potassium chloride SA (K-DUR,KLOR-CON) 20 MEQ tablet Take 1 tablet (20 mEq total) by mouth daily.    . sertraline (ZOLOFT) 100 MG tablet Take 100 mg by mouth daily.     Marland Kitchen torsemide (DEMADEX) 5 MG tablet Take 5 mg by mouth daily.    . Insulin Pen Needle (BD ULTRA-FINE PEN NEEDLES) 29G X 12.7MM MISC USE AS DIRECTED WITH LANTUS      Drug Regimen Review  Drug regimen was reviewed and remains appropriate with no significant issues identified  Home: Home Living Family/patient expects to be discharged to:: Private residence Living Arrangements: Alone Available Help at Discharge: Family, Available PRN/intermittently Type of Home: Apartment Home Access: Level entry Home Layout: One level Bathroom Shower/Tub: Chiropodist: Handicapped height Home Equipment: Geneticist, molecular, Civil engineer, contracting, Environmental consultant - 2 wheels, Cane - single point   Functional History: Prior Function Level of Independence: Independent  Functional Status:  Mobility: Bed Mobility Overal bed mobility: Needs Assistance Bed Mobility: Supine to Sit Rolling: Supervision Sidelying to sit: Supervision Supine to sit:  Min guard Sit to supine: Mod assist, +2 for physical assistance General bed mobility comments: for safety/lines  Transfers Overall transfer level: Needs assistance Equipment used: Rolling walker (2 wheeled) Transfer via Lift Equipment: Maximove Transfers: Sit to/from Stand Sit to Stand: Min assist, Min guard, +2 safety/equipment  Lateral/Scoot Transfers: Mod assist (level surfaces (bed to drop arm recliner)) General transfer comment: Assist to bring hips up on initial sit to stand from bed but progressed to min guard from recliner on other 2 sit to stands Ambulation/Gait Ambulation/Gait assistance: Min assist, +2 safety/equipment Gait Distance (Feet): 45 Feet (x 2) Assistive device: Rolling walker (2 wheeled) Gait Pattern/deviations: Step-through pattern, Decreased stride length, Trunk flexed General Gait Details: Assist for balance and support. Verbal cues not to get too close to front of walker. Legs tremulous.  Gait velocity: decr Gait velocity interpretation: <1.8 ft/sec, indicate of risk for recurrent falls    ADL: ADL Overall ADL's : Needs assistance/impaired Eating/Feeding: Modified independent, Sitting Eating/Feeding Details (indicate cue type and reason): setup with lunch tray end of session Grooming: Wash/dry face, Supervision/safety, Sitting Grooming Details (indicate cue type and reason): pt reports typically performs in sitting at home (on  shower chair at  sink)  Upper Body Bathing: Moderate assistance, Sitting Lower Body Bathing: Moderate assistance, +2 for physical assistance, Sitting/lateral leans Lower Body Bathing Details (indicate cue type and reason): Normally gets socks on sitting on EOB and turns sideways, he can cross RLE over left do to this but cannot due left over right Upper Body Dressing : Moderate assistance, Sitting Lower Body Dressing: Moderate assistance, +2 for physical assistance, Sitting/lateral leans Functional mobility during ADLs: Minimal  assistance, Rolling walker, +2 for safety/equipment General ADL Comments: Pt completed bed mobility, sat EOB about 20 minutes. Attempted standing from EOB, squat-pivot transfer with +2 and stedy. Pt with R lateral lean. Ultimately unable to clear hips R>L from EOB. Pt also reporting lightheadedness on standing attempt with stedy. Pt returned to supine and bp assessed.  Cognition: Cognition Overall Cognitive Status: Impaired/Different from baseline Orientation Level: Oriented X4 Cognition Arousal/Alertness: Awake/alert Behavior During Therapy: Impulsive Overall Cognitive Status: Impaired/Different from baseline Area of Impairment: Safety/judgement Safety/Judgement: Decreased awareness of safety, Decreased awareness of deficits General Comments: Improved safety/judgement from prior session but still some deficits   Blood pressure 129/80, pulse 80, temperature 98.2 F (36.8 C), temperature source Oral, resp. rate 20, height 6' (1.829 m), weight (!) 140.4 kg, SpO2 95 %. Physical Exam Nursing note reviewed. Exam conducted with a chaperone present.  Constitutional:      Appearance: Normal appearance.  Neck:     Comments: Air loss noted when speaking.  Pulmonary:     Effort: Pulmonary effort is normal.     Comments: Air loss noted around trach with conversation.  Abdominal:     General: There is no distension.     Tenderness: There is no abdominal tenderness.  Musculoskeletal:     Cervical back: No rigidity.  Neurological:     Mental Status: He is alert and oriented to person, place, and time.     General: Alert and oriented x 3, No apparent distress, obese HEENT: Head is normocephalic, atraumatic, PERRLA, EOMI, sclera anicteric, size 6 cuffed adjustable trach in place, phonating well.  Neck: Supple without JVD or lymphadenopathy Heart: Reg rate and rhythm. No murmurs rubs or gallops Chest: CTA bilaterally without wheezes, rales, or rhonchi; no distress Abdomen: Soft, non-tender,  non-distended, bowel sounds positive. Extremities: No clubbing, cyanosis, or edema. Pulses are 2+ Skin: Clean and intact without signs of breakdown Neuro: Pt is cognitively appropriate with normal insight, memory, and awareness. Cranial nerves 2-12 are intact. Fine motor coordination is intact. No tremors. Motor function is grossly 4/5- generalized weakness.  Psych: Pt's affect is cheerful. Pt is cooperative  Results for orders placed or performed during the hospital encounter of 07/30/19 (from the past 48 hour(s))  Glucose, capillary     Status: Abnormal   Collection Time: 09/13/19 11:10 AM  Result Value Ref Range   Glucose-Capillary 137 (H) 70 - 99 mg/dL    Comment: Glucose reference range applies only to samples taken after fasting for at least 8 hours.  Glucose, capillary     Status: Abnormal   Collection Time: 09/13/19  4:52 PM  Result Value Ref Range   Glucose-Capillary 120 (H) 70 - 99 mg/dL    Comment: Glucose reference range applies only to samples taken after fasting for at least 8 hours.  Glucose, capillary     Status: Abnormal   Collection Time: 09/13/19  9:03 PM  Result Value Ref Range   Glucose-Capillary 142 (H) 70 - 99 mg/dL    Comment: Glucose reference range  applies only to samples taken after fasting for at least 8 hours.  Glucose, capillary     Status: None   Collection Time: 09/13/19 11:33 PM  Result Value Ref Range   Glucose-Capillary 91 70 - 99 mg/dL    Comment: Glucose reference range applies only to samples taken after fasting for at least 8 hours.   Comment 1 Notify RN    Comment 2 Document in Chart   Glucose, capillary     Status: Abnormal   Collection Time: 09/14/19  3:08 AM  Result Value Ref Range   Glucose-Capillary 135 (H) 70 - 99 mg/dL    Comment: Glucose reference range applies only to samples taken after fasting for at least 8 hours.   Comment 1 Notify RN    Comment 2 Document in Chart   Glucose, capillary     Status: Abnormal   Collection Time:  09/14/19  8:06 AM  Result Value Ref Range   Glucose-Capillary 103 (H) 70 - 99 mg/dL    Comment: Glucose reference range applies only to samples taken after fasting for at least 8 hours.  Glucose, capillary     Status: Abnormal   Collection Time: 09/14/19 12:32 PM  Result Value Ref Range   Glucose-Capillary 166 (H) 70 - 99 mg/dL    Comment: Glucose reference range applies only to samples taken after fasting for at least 8 hours.  Glucose, capillary     Status: Abnormal   Collection Time: 09/14/19  3:47 PM  Result Value Ref Range   Glucose-Capillary 103 (H) 70 - 99 mg/dL    Comment: Glucose reference range applies only to samples taken after fasting for at least 8 hours.  Glucose, capillary     Status: Abnormal   Collection Time: 09/14/19  8:15 PM  Result Value Ref Range   Glucose-Capillary 177 (H) 70 - 99 mg/dL    Comment: Glucose reference range applies only to samples taken after fasting for at least 8 hours.   Comment 1 Notify RN   Glucose, capillary     Status: Abnormal   Collection Time: 09/14/19 11:33 PM  Result Value Ref Range   Glucose-Capillary 102 (H) 70 - 99 mg/dL    Comment: Glucose reference range applies only to samples taken after fasting for at least 8 hours.   Comment 1 Notify RN    Comment 2 Document in Chart   Glucose, capillary     Status: Abnormal   Collection Time: 09/15/19  3:37 AM  Result Value Ref Range   Glucose-Capillary 113 (H) 70 - 99 mg/dL    Comment: Glucose reference range applies only to samples taken after fasting for at least 8 hours.   Comment 1 Notify RN    Comment 2 Document in Chart   Glucose, capillary     Status: Abnormal   Collection Time: 09/15/19  7:42 AM  Result Value Ref Range   Glucose-Capillary 101 (H) 70 - 99 mg/dL    Comment: Glucose reference range applies only to samples taken after fasting for at least 8 hours.   Comment 1 Notify RN    Comment 2 Document in Chart   Glucose, capillary     Status: Abnormal   Collection Time:  09/15/19 11:00 AM  Result Value Ref Range   Glucose-Capillary 169 (H) 70 - 99 mg/dL    Comment: Glucose reference range applies only to samples taken after fasting for at least 8 hours.   Comment 1 Notify RN  Comment 2 Document in Chart    No results found.  Medical Problem List and Plan: 1.  Impaired mobility and ADLs secondary to cardiac and pulmonary debility  -patient may sponge bathe  -ELOS/Goals: 7-8 days modI 2.  Antithrombotics: -DVT/anticoagulation:  Pharmaceutical: Lovenox  -antiplatelet therapy: N/A 3. Pain Management: Gabapentin 200mg  TID effective for neuropathy. Was on oxycodone 10mg  q6H PRN (using sparingly- decrease to 5mg  q6H PRN). Has PRN tylenol.  4. Mood: On Seroquel 100mg  BID and Zoloft 100mg  daily.  5. Neuropsych: This patient is capable of making decisions on his own behalf. 6. Skin/Wound Care: Routine tracheostomy care. Continue Juven bid with multivitamin.  7. Fluids/Electrolytes/Nutrition: CMP tomorrow.  8.. T2DM:  On Lantus 30 with novolog 8-10 units tid with meals. Intake has been good--continue to monitor BS ac/hs. Will change levemir to lantus bid. Add CM restrictions to diet.  9. COPD: Used MDI with nebs prn wheezing PTA. Has not had to use oxygen for past couple of years. Resume  MDI. Continue hypertonic saline nebs.  10. Chronic CHF: Monitor for signs of overload and check weights daily. Continue lopressor bid with demadex,  11. Dysuria: Foley d/c today-- will order UA/UCS to rule out infection. Encourage fluid intake.  12. Anxiety: Managed by Zoloft and Seroquel bid.    Bary Leriche, PA-C 09/15/2019   I have personally performed a face to face diagnostic evaluation, including, but not limited to relevant history and physical exam findings, of this patient and developed relevant assessment and plan.  Additionally, I have reviewed and concur with the physician assistant's documentation above.  Leeroy Cha, MD

## 2019-09-15 NOTE — Progress Notes (Signed)
Inpatient Rehabilitation Medication Review by a Pharmacist  A complete drug regimen review was completed for this patient to identify any potential clinically significant medication issues.  Clinically significant medication issues were identified:  no  Discontinued PRN Robitussin given patient on scheduled Mucinex DM.  Check AMION for pharmacist assigned to patient if future medication questions/issues arise during this admission.  Pharmacist comments:   Time spent performing this drug regimen review (minutes):  [place], Pharm.D., BCPS Clinical Pharmacist  **Pharmacist phone directory can be found on [email or web address] listed under [place].  09/15/2019 9:11 PM

## 2019-09-15 NOTE — PMR Pre-admission (Signed)
PMR Admission Coordinator Pre-Admission Assessment  Patient: Timothy Le is an 59 y.o., male MRN: [id number] DOB: 1960-06-15 Height: 6' (182.9 cm) Weight: (!) 140.4 kg (scale B)  Insurance Information HMO:     PPO:      PCP:      IPA:      80/20:      OTHER:  PRIMARY: Medicaid [place]      Policy#: [id number]      Subscriber: pt Benefits:  Phone #: passport one online e     Name: 8/27 Eff. Date: active    [place]  Financial Counselor:       Phone#:   [employer]" for patients in Inpatient Rehabilitation Facilities with attached "Privacy Act [place] Records" was provided and verbally reviewed with: N/A  Emergency Contact Information Contact Information    Name Relation Home Work Montgomery, [name] Sister   [phone]   Kodie, Kishi   [phone]   Nelwyn Salisbury Niece   [phone]   Yoon, Barca Spouse   [phone]      Current Medical History  Patient Admitting Diagnosis: Debility  History of Present Illness: 59 year old male with medical history of type 2 diabetes, HTN, COPD on home oxygen, morbid obesity, OSA, history of tracheal stenosis and prior tracheostomy who presented on 07/30/2019 with worsening SOB and being off his CPAP for 2 weeks ( likely broken). Was found to have acute on chronic hypoxic/hypercapnic respiratory failure due to OHS/OSA complicate by large thyroid goiter. Patient required intubation upon presentation to the ED due to minimally responsive. Hospital course complicated by failed extubation related to acute respiratory failure and failed attempts of reintubation requiring trach on 7/16. 7/24 trach became dislodges in the setting of agitation and required emergent reintubation followed by trach revision with ENT on 7/26. 8/9 mucous plug resulting in bradycardia and CODE BLUE. Total thyroidectomy and cervical lipectomy and trach revision with ENT on 8/11.   Bronchoscopy performed by PCCM on 8/18  with no evidence of obstructive mucus or debris and clear visualization of the carina.  Tolerating 28 % trach collar with nebulized hypertonic saline . PCCM to transition to cuff less trach ( same style trach patient used previously at home prior to outpatient decannulation).    Patient's medical record from [facility]  has been reviewed by the rehabilitation admission coordinator and physician.  Past Medical History  Past Medical History:  Diagnosis Date  . Asthma   . Chronic respiratory failure with hypoxia ([place]) 07/09/2017  . COPD (chronic obstructive pulmonary disease) ([place])   . Diabetes mellitus without complication ([place])   . Difficult intubation   . Hypertension   . Shortness of breath dyspnea   . Sleep apnea     Family History   family history includes Asthma in his sister and another family member; Diabetes type II in his sister; Hypertension in his sister.  Prior Rehab/Hospitalizations Has the patient had prior rehab or hospitalizations prior to admission? Yes CIR 2019. Went to SN[facility] for 3 1/2 months then home with trach. Did not go home from [place] for he was in second level apartment and would not learn trach care at that time. After SNF he went home and managed trach at Mod I level up until decannulation.  Has the patient had major surgery during 100 days prior to admission? Yes   Current Medications  Current Facility-Administered Medications:  .  Place/Maintain arterial line, , ,  Until Discontinued **AND** 0.9 %  sodium chloride infusion, , Intra-arterial, PRN, Salvadore Dom E, NP .  0.9 %  sodium chloride infusion, , Intravenous, Continuous, Erick Colace, NP, Stopped at 08/30/19 1427 .  acetaminophen (TYLENOL) tablet 650 mg, 650 mg, Oral, Q6H PRN, Oretha Milch D, MD .  allopurinol (ZYLOPRIM) tablet 100 mg, 100 mg, Oral, Daily, Dahal, Binaya, MD, 100 mg at 09/15/19 1015 .  alum & mag hydroxide-simeth (MAALOX/MYLANTA) 200-200-20  MG/5ML suspension 30 mL, 30 mL, Oral, Q4H PRN, Dahal, Binaya, MD, 30 mL at 09/05/19 2155 .  atorvastatin (LIPITOR) tablet 10 mg, 10 mg, Oral, Daily, Hunsucker, Bonna Gains, MD, 10 mg at 09/15/19 1014 .  chlorhexidine gluconate (MEDLINE KIT) (PERIDEX) 0.12 % solution 15 mL, 15 mL, Mouth Rinse, BID, Chand, Sudham, MD, 15 mL at 09/14/19 2022 .  Chlorhexidine Gluconate Cloth 2 % PADS 6 each, 6 each, Topical, Daily, Icard, Bradley L, DO, 6 each at 09/15/19 0516 .  dextromethorphan-guaiFENesin (MUCINEX DM) 30-600 MG per 12 hr tablet 1 tablet, 1 tablet, Oral, BID, Oretha Milch D, MD, 1 tablet at 09/15/19 1014 .  enoxaparin (LOVENOX) injection 75 mg, 0.5 mg/kg, Subcutaneous, Q24H, Dahal, Binaya, MD, 75 mg at 09/15/19 1015 .  feeding supplement (GLUCERNA SHAKE) (GLUCERNA SHAKE) liquid 237 mL, 237 mL, Oral, TID BM, Erin Hearing L, NP .  gabapentin (NEURONTIN) capsule 200 mg, 200 mg, Oral, TID, Dahal, Binaya, MD, 200 mg at 09/15/19 1014 .  hydrALAZINE (APRESOLINE) injection 10-40 mg, 10-40 mg, Intravenous, Q4H PRN, Erick Colace, NP, 20 mg at 08/16/19 0730 .  insulin aspart (novoLOG) injection 0-20 Units, 0-20 Units, Subcutaneous, Q4H, Audria Nine, DO, 4 Units at 09/15/19 1138 .  insulin detemir (LEVEMIR) injection 33 Units, 33 Units, Subcutaneous, Q12H, Samella Parr, NP, 33 Units at 09/15/19 0516 .  ipratropium-albuterol (DUONEB) 0.5-2.5 (3) MG/3ML nebulizer solution 3 mL, 3 mL, Nebulization, Q6H PRN, Jacky Kindle, MD, 3 mL at 09/13/19 0805 .  melatonin tablet 3 mg, 3 mg, Oral, QHS, Hunsucker, Bonna Gains, MD, 3 mg at 09/14/19 2142 .  metoprolol tartrate (LOPRESSOR) tablet 12.5 mg, 12.5 mg, Oral, BID, Dahal, Binaya, MD, 12.5 mg at 09/15/19 1013 .  multivitamin with minerals tablet 1 tablet, 1 tablet, Oral, Daily, Dahal, Binaya, MD, 1 tablet at 09/15/19 1014 .  nutrition supplement (JUVEN) (JUVEN) powder packet 1 packet, 1 packet, Oral, BID BM, Dahal, Marlowe Aschoff, MD, 1 packet at 09/15/19 1014 .   oxyCODONE (Oxy IR/ROXICODONE) immediate release tablet 10 mg, 10 mg, Oral, Q6H PRN, [place], Yujing Z, RPH, 10 mg at 09/14/19 2142 .  pantoprazole (PROTONIX) EC tablet 40 mg, 40 mg, Oral, QHS, Priscella Mann, RPH, 40 mg at 09/14/19 2142 .  polyethylene glycol (MIRALAX / GLYCOLAX) packet 17 g, 17 g, Per Tube, Daily, McQuaid, Douglas B, MD, 17 g at 09/15/19 1014 .  QUEtiapine (SEROQUEL) tablet 100 mg, 100 mg, Oral, BID, Hunsucker, Bonna Gains, MD, 100 mg at 09/15/19 1014 .  senna-docusate (Senokot-S) tablet 2 tablet, 2 tablet, Oral, BID, Oretha Milch D, MD, 2 tablet at 09/15/19 1014 .  sertraline (ZOLOFT) tablet 100 mg, 100 mg, Oral, Daily, Hunsucker, Bonna Gains, MD, 100 mg at 09/15/19 1014 .  sodium chloride flush (NS) 0.9 % injection 10-40 mL, 10-40 mL, Intracatheter, Q12H, Chand, Sudham, MD, 10 mL at 09/15/19 1015 .  sodium chloride flush (NS) 0.9 % injection 10-40 mL, 10-40 mL, Intracatheter, PRN, Jacky Kindle, MD, 10 mL at 08/01/19 2135 .  sodium chloride HYPERTONIC 3 % nebulizer  solution 4 mL, 4 mL, Nebulization, BID, Hunsucker, Bonna Gains, MD, 4 mL at 09/14/19 1947 .  torsemide (DEMADEX) tablet 10 mg, 10 mg, Oral, Daily, Dahal, Binaya, MD, 10 mg at 09/15/19 1013  Patients Current Diet:  Diet Order            Diet Carb Modified           DIET SOFT Room service appropriate? Yes; Fluid consistency: Thin; Fluid restriction: 1500 mL Fluid  Diet effective now                 Precautions / Restrictions Precautions Precautions: Fall Precaution Comments: trach collar, PMV Restrictions Weight Bearing Restrictions: No   Has the patient had 2 or more falls or a fall with injury in the past year? No  Prior Activity Level Limited Community (1-2x/wk): Mod I with AD  Prior Functional Level Self Care: Did the patient need help bathing, dressing, using the toilet or eating? Independent  Indoor Mobility: Did the patient need assistance with walking from room to room (with or without  device)? Independent  Stairs: Did the patient need assistance with internal or external stairs (with or without device)? Independent  Functional Cognition: Did the patient need help planning regular tasks such as shopping or remembering to take medications? Independent  Home Assistive Devices / Equipment Home Assistive Devices/Equipment: CPAP Home Equipment: Toilet riser, Shower seat, Environmental consultant - 2 wheels, Cane - single point  Prior Device Use: Indicate devices/aids used by the patient prior to current illness, exacerbation or injury? cane  Current Functional Level Cognition  Overall Cognitive Status: Impaired/Different from baseline Orientation Level: Oriented X4 Safety/Judgement: Decreased awareness of safety, Decreased awareness of deficits General Comments: Improved safety/judgement from prior session but still some deficits    Extremity Assessment (includes Sensation/Coordination)  Upper Extremity Assessment: Defer to OT evaluation  Lower Extremity Assessment: RLE deficits/detail, LLE deficits/detail RLE Deficits / Details: grossly 3-/5 LLE Deficits / Details: grossly 3-/5    ADLs  Overall ADL's : Needs assistance/impaired Eating/Feeding: Modified independent, Sitting Eating/Feeding Details (indicate cue type and reason): setup with lunch tray end of session Grooming: Wash/dry face, Supervision/safety, Sitting Grooming Details (indicate cue type and reason): pt reports typically performs in sitting at home (on shower chair at  sink)  Upper Body Bathing: Moderate assistance, Sitting Lower Body Bathing: Moderate assistance, +2 for physical assistance, Sitting/lateral leans Lower Body Bathing Details (indicate cue type and reason): Normally gets socks on sitting on EOB and turns sideways, he can cross RLE over left do to this but cannot due left over right Upper Body Dressing : Moderate assistance, Sitting Lower Body Dressing: Moderate assistance, +2 for physical assistance,  Sitting/lateral leans Functional mobility during ADLs: Minimal assistance, Rolling walker, +2 for safety/equipment General ADL Comments: Pt completed bed mobility, sat EOB about 20 minutes. Attempted standing from EOB, squat-pivot transfer with +2 and stedy. Pt with R lateral lean. Ultimately unable to clear hips R>L from EOB. Pt also reporting lightheadedness on standing attempt with stedy. Pt returned to supine and bp assessed.    Mobility  Overal bed mobility: Needs Assistance Bed Mobility: Supine to Sit Rolling: Supervision Sidelying to sit: Supervision Supine to sit: Min guard Sit to supine: Mod assist, +2 for physical assistance General bed mobility comments: for safety/lines     Transfers  Overall transfer level: Needs assistance Equipment used: Rolling walker (2 wheeled) Transfer via Lift Equipment: Maximove Transfers: Sit to/from Stand Sit to Stand: Min assist, Min guard, +2 safety/equipment  Lateral/Scoot Transfers: Mod assist (level surfaces (bed to drop arm recliner)) General transfer comment: Assist to bring hips up on initial sit to stand from bed but progressed to min guard from recliner on other 2 sit to stands    Ambulation / Gait / Stairs / Wheelchair Mobility  Ambulation/Gait Ambulation/Gait assistance: Min assist, +2 safety/equipment Gait Distance (Feet): 45 Feet (x 2) Assistive device: Rolling walker (2 wheeled) Gait Pattern/deviations: Step-through pattern, Decreased stride length, Trunk flexed General Gait Details: Assist for balance and support. Verbal cues not to get too close to front of walker. Legs tremulous.  Gait velocity: decr Gait velocity interpretation: <1.8 ft/sec, indicate of risk for recurrent falls    Posture / Balance Dynamic Sitting Balance Sitting balance - Comments: Able to sit EOB without support. Balance Overall balance assessment: Needs assistance Sitting-balance support: No upper extremity supported, Feet supported Sitting  balance-Leahy Scale: Fair Sitting balance - Comments: Able to sit EOB without support. Postural control: Right lateral lean Standing balance support: During functional activity Standing balance-Leahy Scale: Poor Standing balance comment: walker and min assist for static standing    Special needs/care consideration Daily visitor TBD 8/27 to transition to 5 proximal XLT cuff less trach that he had previously at home. Followed by PCCM, Marni Griffon as an outpatient. 28%TC on 8/27. Hgb A1c 14.9   Previous Home Environment  Living Arrangements: Alone  Lives With: Alone Available Help at Discharge: Family, Available PRN/intermittently Type of Home: Apartment Home Layout: One level Home Access: Level entry Bathroom Shower/Tub: Chiropodist: Handicapped height Bathroom Accessibility: Yes How Accessible: Accessible via walker [place]: No  Discharge Living Setting Plans for Discharge Living Setting: Patient's home, Apartment, Alone Type of Home at Discharge: Apartment Discharge Home Layout: One level Discharge Home Access: Level entry Discharge Bathroom Shower/Tub: Tub/shower unit Discharge Bathroom Toilet: Handicapped height Discharge Bathroom Accessibility: Yes How Accessible: Accessible via walker Does the patient have any problems obtaining your medications?: No  Social/Family/Support Systems Patient Roles: Parent (has adult son and his legally separated from wife for 7 yea) Contact Information: sister, [name] Designer, jewellery Anticipated Caregiver: sister twice weekly only; she works Anticipated Ambulance person Information: per chart Ability/Limitations of Caregiver: diane works 8 until 5; can take calls after 2 pm during her lunch Caregiver Availability: Intermittent Discharge Plan Discussed with Primary Caregiver: Yes Is Caregiver In Agreement with Plan?: Yes Does Caregiver/Family have Issues with Lodging/Transportation while Pt is in Rehab?:  No  Goals Patient/Family Goal for Rehab: Mod I with PT and OT Expected length of stay: ELOS 10 to 12 days Additional Information: Patietn has had same trach before and cared for it without problems Pt/Family Agrees to Admission and willing to participate: Yes Program Orientation Provided & Reviewed with Pt/Caregiver Including Roles  & Responsibilities: Yes  Decrease burden of Care through IP rehab admission: n/a  Possible need for SNF placement upon discharge: not anticipated. He does not want SNF again for he would have to give up his monthly check  Patient Condition: I have reviewed medical records from [facility] , spoken with CM, and patient and family member. I met with patient at the bedside for inpatient rehabilitation assessment.  Patient will benefit from ongoing PT and OT, can actively participate in 3 hours of therapy a day 5 days of the week, and can make measurable gains during the admission.  Patient will also benefit from the coordinated team approach during an Inpatient Acute Rehabilitation admission.  The patient will receive  intensive therapy as well as Rehabilitation physician, nursing, social worker, and care management interventions.  Due to bladder management, bowel management, safety, skin/wound care, disease management, medication administration, pain management and patient education the patient requires 24 hour a day rehabilitation nursing.  The patient is currently min assist with mobility and basic ADLs.  Discharge setting and therapy post discharge at home with home health is anticipated.  Patient has agreed to participate in the Acute Inpatient Rehabilitation Program and will admit today.  Preadmission Screen Completed By:  Cleatrice Burke, 09/15/2019 11:39 AM ______________________________________________________________________   Discussed status with Dr. Ranell Patrick on 09/15/19 at 12:09pm and received approval for admission today.  Admission Coordinator:   Cleatrice Burke, RN, time 12:09pm/Date 09/15/19   Assessment/Plan: Diagnosis: 1. Does the need for close, 24 hr/day Medical supervision in concert with the patient's rehab needs make it unreasonable for this patient to be served in a less intensive setting? Yes 2. Co-Morbidities requiring supervision/potential complications: type 2 DM, OSA< morbid obeisty (BMI 41.98), COPD on oxygen, tracheal stenosis requiring tracheostomy, s/p intubation, acute on chronic respiratory failure, bradycardia, mucus plug, thyroidectomy, cervical lipectomy, post-op anemia.  3. Due to bladder management, bowel management, safety, skin/wound care, disease management, medication administration, pain management and patient education, does the patient require 24 hr/day rehab nursing? Yes 4. Does the patient require coordinated care of a physician, rehab nurse, PT, OT, and SLP to address physical and functional deficits in the context of the above medical diagnosis(es)? Yes Addressing deficits in the following areas: balance, endurance, locomotion, strength, transferring, bowel/bladder control, bathing, dressing, feeding, grooming, toileting and psychosocial support 5. Can the patient actively participate in an intensive therapy program of at least 3 hrs of therapy 5 days a week? Yes 6. The potential for patient to make measurable gains while on inpatient rehab is excellent 7. Anticipated functional outcomes upon discharge from inpatient rehab: modified independent PT, modified independent OT, independent SLP 8. Estimated rehab length of stay to reach the above functional goals is: 7-8 days 9. Anticipated discharge destination: Home 10. Overall Rehab/Functional Prognosis: excellent   MD Signature: Leeroy Cha, MD

## 2019-09-15 NOTE — H&P (Addendum)
Physical Medicine and Rehabilitation Admission H&P    CC: Debility   HPI: Timothy Le is a 59 year old male with history of T2DM, OSA, morbid obesity-BMI 41.98, COPD,  tracheal stenosis and prior tracheostomy who was admitted on 07/30/19 with reports of BIPAP not working for 2 weeks,acute on chronic respiratory failure and was unresponsive requiring intubation. Hospital course significant for electrolyte abnormalities, hyperglycemia as well as inability to tolerate extubation requiring emergent tracheostomy by Dr. Grandville Silos.  ENT consulted for input due to large thyroid goiter and felt that patient would need a thyroidectomy prior to decannulation.  Trach did dislodge on 07/24 due to agitation requiring emergent reintubation and he underwent tracheostomy by Dr. Constance Holster on 07/26.  He has had issues with recurrent mucous plug and bradycardic arrest treated with ACLS protocol on 08/09.    He underwent thyroidectomy, cervical lipectomy and tracheostomy revision on 08/11.  He had significant bleeding postop requiring 2 units PRBC.  He was tolerated extubation and was started on regular diet by extubated and started on regular diet by 08/16.  Respiratory status stable and chronic diastolic CHF compensated--Cardura and metaxalone on home.  Gabapentin added to help manage neuropathic symptoms. Trach downsized to 5 XLT today.  Therapy ongoing and CIR recommended due to debility.   Review of Systems  Constitutional: Negative for chills and fever.  HENT: Negative for hearing loss and tinnitus.   Eyes: Negative for blurred vision and double vision.  Respiratory: Negative for cough and shortness of breath.   Cardiovascular: Negative for chest pain, palpitations and leg swelling.  Gastrointestinal: Negative for heartburn and nausea.  Genitourinary: Negative for dysuria and urgency.  Musculoskeletal: Negative for myalgias.  Skin: Negative for itching and rash.  Neurological: Positive for speech  change. Negative for dizziness and headaches.  Psychiatric/Behavioral: Negative for depression. The patient does not have insomnia.       Past Medical History:  Diagnosis Date  . Asthma   . Chronic respiratory failure with hypoxia ([place]) 07/09/2017  . COPD (chronic obstructive pulmonary disease) ([place])   . Diabetes mellitus without complication ([place])   . Difficult intubation   . Hypertension   . Shortness of breath dyspnea   . Sleep apnea     Past Surgical History:  Procedure Laterality Date  . IR GASTROSTOMY TUBE MOD SED  07/05/2017  . THYROIDECTOMY Left 08/30/2019   Procedure: THYROIDECTOMY CERVICAL LIPECTOMY;  Surgeon: Izora Gala, MD;  Location: [facility];  Service: ENT;  Laterality: Left;  NEEDS RNFA PLEASE  . TRACHEOSTOMY TUBE PLACEMENT N/A 06/17/2017   Procedure: TRACHEOSTOMY;  Surgeon: Leta Baptist, MD;  Location: [facility];  Service: ENT;  Laterality: N/A;  . TRACHEOSTOMY TUBE PLACEMENT N/A 08/14/2019   Procedure: TRACHEOSTOMY;  Surgeon: Izora Gala, MD;  Location: [facility];  Service: ENT;  Laterality: N/A;  . TRACHEOSTOMY TUBE PLACEMENT N/A 08/30/2019   Procedure: TRACHEOSTOMY  REVISION;  Surgeon: Izora Gala, MD;  Location: [facility];  Service: ENT;  Laterality: N/A;    Family History  Problem Relation Age of Onset  . Asthma Sister   . Asthma Other        nephew  . Hypertension Sister   . Diabetes type II Sister     Social History:  Lives alone. Disabled. He reports that he quit smoking about 6 years ago. His smoking use included cigarettes. He started smoking about 36 years ago. He has a 0.25 pack-year smoking history. He has never used smokeless tobacco. He drinks a  couple of beers/once a months. He does not use drugs.    Allergies: No Known Allergies    Medications Prior to Admission  Medication Sig Dispense Refill  . acetaminophen (TYLENOL) 325 MG tablet Take 2 tablets (650 mg total) by mouth every 6 (six) hours as needed for moderate pain.    Marland Kitchen allopurinol (ZYLOPRIM) 100 MG  tablet Take 100 mg by mouth daily.    Marland Kitchen alum & mag hydroxide-simeth (MAALOX/MYLANTA) 200-200-20 MG/5ML suspension Take 30 mLs by mouth every 4 (four) hours as needed for indigestion, heartburn or flatulence. 355 mL 0  . atorvastatin (LIPITOR) 10 MG tablet Take 10 mg by mouth daily.    . chlorhexidine gluconate, MEDLINE KIT, (PERIDEX) 0.12 % solution 15 mLs by Mouth Rinse route 2 (two) times daily. 120 mL 0  . dextromethorphan-guaiFENesin (MUCINEX DM) 30-600 MG 12hr tablet Take 1 tablet by mouth 2 (two) times daily.    Derrill Memo ON 09/16/2019] enoxaparin (LOVENOX) 80 MG/0.8ML injection Inject 0.75 mLs (75 mg total) into the skin daily. 0 mL   . feeding supplement, GLUCERNA SHAKE, (GLUCERNA SHAKE) LIQD Take 237 mLs by mouth 3 (three) times daily between meals.  0  . gabapentin (NEURONTIN) 100 MG capsule Take 2 capsules (200 mg total) by mouth 3 (three) times daily.    . insulin aspart (NOVOLOG) 100 UNIT/ML injection Inject 0-20 Units into the skin every 4 (four) hours. CBG < 70: Implement Hypoglycemia Standing Orders and refer to Hypoglycemia Standing Orders sidebar report  CBG 70 - 120: 0 units  CBG 121 - 150: 3 units  CBG 151 - 200: 4 units  CBG 201 - 250: 7 units  CBG 251 - 300: 11 units  CBG 301 - 350: 15 units  CBG 351 - 400: 20 units  CBG > 400 call MD and obtain STAT lab verification 10 mL 11  . insulin detemir (LEVEMIR) 100 UNIT/ML injection Inject 0.33 mLs (33 Units total) into the skin every 12 (twelve) hours. 10 mL 11  . ipratropium-albuterol (DUONEB) 0.5-2.5 (3) MG/3ML SOLN Take 3 mLs by nebulization every 6 (six) hours as needed. 360 mL   . melatonin 3 MG TABS tablet Take 1 tablet (3 mg total) by mouth at bedtime.  0  . metoprolol tartrate (LOPRESSOR) 25 MG tablet Take 0.5 tablets (12.5 mg total) by mouth 2 (two) times daily.    Derrill Memo ON 09/16/2019] Multiple Vitamin (MULTIVITAMIN WITH MINERALS) TABS tablet Take 1 tablet by mouth daily.    . nutrition supplement, JUVEN, (JUVEN) PACK  Take 1 packet by mouth 2 (two) times daily between meals.  0  . oxyCODONE 10 MG TABS Take 1 tablet (10 mg total) by mouth every 6 (six) hours as needed for severe pain. 30 tablet 0  . pantoprazole (PROTONIX) 40 MG tablet Take 1 tablet (40 mg total) by mouth at bedtime.    Marland Kitchen QUEtiapine (SEROQUEL) 100 MG tablet Take 1 tablet (100 mg total) by mouth 2 (two) times daily.    Marland Kitchen senna-docusate (SENOKOT-S) 8.6-50 MG tablet Take 2 tablets by mouth 2 (two) times daily.    . sodium chloride 0.9 % infusion Inject 250 mLs into the vein continuous. 10 mL 0  . sodium chloride flush (NS) 0.9 % SOLN 10-40 mLs by Intracatheter route every 12 (twelve) hours.    . sodium chloride flush (NS) 0.9 % SOLN 10-40 mLs by Intracatheter route as needed (flush).    . sodium chloride HYPERTONIC 3 % nebulizer solution Take 4 mLs  by nebulization 2 (two) times daily. 750 mL 12  . [START ON 09/16/2019] torsemide (DEMADEX) 10 MG tablet Take 1 tablet (10 mg total) by mouth daily.      Drug Regimen Review  Drug regimen was reviewed and remains appropriate with no significant issues identified  Home: Home Living Family/patient expects to be discharged to:: Private residence Living Arrangements: Alone Available Help at Discharge: Family, Available PRN/intermittently Type of Home: Apartment Home Access: Level entry Home Layout: One level Bathroom Shower/Tub: Chiropodist: Handicapped height Home Equipment: Geneticist, molecular, Civil engineer, contracting, Environmental consultant - 2 wheels, Cane - single point   Functional History: Prior Function Level of Independence: Independent  Functional Status:  Mobility: Bed Mobility Overal bed mobility: Needs Assistance Bed Mobility: Supine to Sit Rolling: Supervision Sidelying to sit: Supervision Supine to sit: Min guard Sit to supine: Mod assist, +2 for physical assistance General bed mobility comments: for safety/lines  Transfers Overall transfer level: Needs assistance Equipment used:  Rolling walker (2 wheeled) Transfer via Lift Equipment: Maximove Transfers: Sit to/from Stand Sit to Stand: Min assist, Min guard, +2 safety/equipment  Lateral/Scoot Transfers: Mod assist (level surfaces (bed to drop arm recliner)) General transfer comment: Assist to bring hips up on initial sit to stand from bed but progressed to min guard from recliner on other 2 sit to stands Ambulation/Gait Ambulation/Gait assistance: Min assist, +2 safety/equipment Gait Distance (Feet): 45 Feet (x 2) Assistive device: Rolling walker (2 wheeled) Gait Pattern/deviations: Step-through pattern, Decreased stride length, Trunk flexed General Gait Details: Assist for balance and support. Verbal cues not to get too close to front of walker. Legs tremulous.  Gait velocity: decr Gait velocity interpretation: <1.8 ft/sec, indicate of risk for recurrent falls  ADL: ADL Overall ADL's : Needs assistance/impaired Eating/Feeding: Modified independent, Sitting Eating/Feeding Details (indicate cue type and reason): setup with lunch tray end of session Grooming: Wash/dry face, Supervision/safety, Sitting Grooming Details (indicate cue type and reason): pt reports typically performs in sitting at home (on shower chair at  sink)  Upper Body Bathing: Moderate assistance, Sitting Lower Body Bathing: Moderate assistance, +2 for physical assistance, Sitting/lateral leans Lower Body Bathing Details (indicate cue type and reason): Normally gets socks on sitting on EOB and turns sideways, he can cross RLE over left do to this but cannot due left over right Upper Body Dressing : Moderate assistance, Sitting Lower Body Dressing: Moderate assistance, +2 for physical assistance, Sitting/lateral leans Functional mobility during ADLs: Minimal assistance, Rolling walker, +2 for safety/equipment General ADL Comments: Pt completed bed mobility, sat EOB about 20 minutes. Attempted standing from EOB, squat-pivot transfer with +2 and  stedy. Pt with R lateral lean. Ultimately unable to clear hips R>L from EOB. Pt also reporting lightheadedness on standing attempt with stedy. Pt returned to supine and bp assessed.  Cognition: Cognition Overall Cognitive Status: Impaired/Different from baseline Orientation Level: Oriented X4 Cognition Arousal/Alertness: Awake/alert Behavior During Therapy: Impulsive Overall Cognitive Status: Impaired/Different from baseline Area of Impairment: Safety/judgement Safety/Judgement: Decreased awareness of safety, Decreased awareness of deficits General Comments: Improved safety/judgement from prior session but still some deficits   There were no vitals taken for this visit. Physical Exam Nursing note reviewed. Exam conducted with a chaperone present.  Constitutional:      Appearance: Normal appearance.  Neck:     Comments: Air loss noted when speaking.  Pulmonary:     Effort: Pulmonary effort is normal.     Comments: Air loss noted around trach with conversation.  Abdominal:  General: There is no distension.     Tenderness: There is no abdominal tenderness.  Musculoskeletal:     Cervical back: No rigidity.  Neurological:     Mental Status: He is alert and oriented to person, place, and time.     General: Alert and oriented x 3, No apparent distress, obese HEENT: Head is normocephalic, atraumatic, PERRLA, EOMI, sclera anicteric, size 6 cuffed adjustable trach in place, phonating well.  Neck: Supple without JVD or lymphadenopathy Heart: Reg rate and rhythm. No murmurs rubs or gallops Chest: CTA bilaterally without wheezes, rales, or rhonchi; no distress Abdomen: Soft, non-tender, non-distended, bowel sounds positive. Extremities: No clubbing, cyanosis, or edema. Pulses are 2+ Skin: Stage 2 pressure injury to buttocks with open area Neuro: Pt is cognitively appropriate with normal insight, memory, and awareness. Cranial nerves 2-12 are intact. Fine motor coordination is  intact. No tremors. Motor function is grossly 4/5- generalized weakness.  Psych: Pt's affect is cheerful. Pt is cooperative  Results for orders placed or performed during the hospital encounter of 07/30/19 (from the past 48 hour(s))  Glucose, capillary     Status: Abnormal   Collection Time: 09/13/19  9:03 PM  Result Value Ref Range   Glucose-Capillary 142 (H) 70 - 99 mg/dL    Comment: Glucose reference range applies only to samples taken after fasting for at least 8 hours.  Glucose, capillary     Status: None   Collection Time: 09/13/19 11:33 PM  Result Value Ref Range   Glucose-Capillary 91 70 - 99 mg/dL    Comment: Glucose reference range applies only to samples taken after fasting for at least 8 hours.   Comment 1 Notify RN    Comment 2 Document in Chart   Glucose, capillary     Status: Abnormal   Collection Time: 09/14/19  3:08 AM  Result Value Ref Range   Glucose-Capillary 135 (H) 70 - 99 mg/dL    Comment: Glucose reference range applies only to samples taken after fasting for at least 8 hours.   Comment 1 Notify RN    Comment 2 Document in Chart   Glucose, capillary     Status: Abnormal   Collection Time: 09/14/19  8:06 AM  Result Value Ref Range   Glucose-Capillary 103 (H) 70 - 99 mg/dL    Comment: Glucose reference range applies only to samples taken after fasting for at least 8 hours.  Glucose, capillary     Status: Abnormal   Collection Time: 09/14/19 12:32 PM  Result Value Ref Range   Glucose-Capillary 166 (H) 70 - 99 mg/dL    Comment: Glucose reference range applies only to samples taken after fasting for at least 8 hours.  Glucose, capillary     Status: Abnormal   Collection Time: 09/14/19  3:47 PM  Result Value Ref Range   Glucose-Capillary 103 (H) 70 - 99 mg/dL    Comment: Glucose reference range applies only to samples taken after fasting for at least 8 hours.  Glucose, capillary     Status: Abnormal   Collection Time: 09/14/19  8:15 PM  Result Value Ref Range    Glucose-Capillary 177 (H) 70 - 99 mg/dL    Comment: Glucose reference range applies only to samples taken after fasting for at least 8 hours.   Comment 1 Notify RN   Glucose, capillary     Status: Abnormal   Collection Time: 09/14/19 11:33 PM  Result Value Ref Range   Glucose-Capillary 102 (H) 70 - 99  mg/dL    Comment: Glucose reference range applies only to samples taken after fasting for at least 8 hours.   Comment 1 Notify RN    Comment 2 Document in Chart   Glucose, capillary     Status: Abnormal   Collection Time: 09/15/19  3:37 AM  Result Value Ref Range   Glucose-Capillary 113 (H) 70 - 99 mg/dL    Comment: Glucose reference range applies only to samples taken after fasting for at least 8 hours.   Comment 1 Notify RN    Comment 2 Document in Chart   Glucose, capillary     Status: Abnormal   Collection Time: 09/15/19  7:42 AM  Result Value Ref Range   Glucose-Capillary 101 (H) 70 - 99 mg/dL    Comment: Glucose reference range applies only to samples taken after fasting for at least 8 hours.   Comment 1 Notify RN    Comment 2 Document in Chart   Glucose, capillary     Status: Abnormal   Collection Time: 09/15/19 11:00 AM  Result Value Ref Range   Glucose-Capillary 169 (H) 70 - 99 mg/dL    Comment: Glucose reference range applies only to samples taken after fasting for at least 8 hours.   Comment 1 Notify RN    Comment 2 Document in Chart   Glucose, capillary     Status: Abnormal   Collection Time: 09/15/19  4:11 PM  Result Value Ref Range   Glucose-Capillary 110 (H) 70 - 99 mg/dL    Comment: Glucose reference range applies only to samples taken after fasting for at least 8 hours.   Comment 1 Notify RN    Comment 2 Document in Chart    No results found.  Medical Problem List and Plan: 1.  Impaired mobility and ADLs secondary to cardiac and pulmonary debility  -patient may sponge bathe  -ELOS/Goals: 7-8 days modI 2.  Antithrombotics: -DVT/anticoagulation:   Pharmaceutical: Lovenox  -antiplatelet therapy: N/A 3. Pain Management: Gabapentin 233m TID effective for neuropathy. Was on oxycodone 130mq6H PRN (using sparingly- decrease to 32m33m6H PRN). Has PRN tylenol.  4. Mood: On Seroquel 100m51mD and Zoloft 100mg52mly.  5. Neuropsych: This patient is capable of making decisions on his own behalf. 6. Skin/Wound Care: Routine tracheostomy care. Continue Juven bid with multivitamin.  7. Fluids/Electrolytes/Nutrition: CMP tomorrow.  8.. T2DM:  On Lantus 30 with novolog 8-10 units tid with meals. Intake has been good--continue to monitor BS ac/hs. Will change levemir to lantus bid. Add CM restrictions to diet.  9. COPD: Used MDI with nebs prn wheezing PTA. Has not had to use oxygen for past couple of years. Resume  MDI. Continue hypertonic saline nebs.  10. Chronic CHF: Monitor for signs of overload and check weights daily. Continue lopressor bid with demadex,  11. Dysuria: Foley d/c today-- will order UA/UCS to rule out infection. Encourage fluid intake.  12. Anxiety: Managed by Zoloft and Seroquel bid.   PamelBary LericheC 09/15/2019   I have personally performed a face to face diagnostic evaluation, including, but not limited to relevant history and physical exam findings, of this patient and developed relevant assessment and plan.  Additionally, I have reviewed and concur with the physician assistant's documentation above.  The patient's status has not changed. The original post admission physician evaluation remains appropriate, and any changes from the pre-admission screening or documentation from the acute chart are noted above.   KrutiLeeroy Cha

## 2019-09-15 NOTE — Progress Notes (Signed)
Pt arrived to unit via bed, pt is alert and oriented and able to make needs known, pt has trach uncuffed with purple speaking valve, pt has productive cough with very thick secretions, has humidification on and removed speak valve due to thickness of secretions. Reported stage II to buttocks with open area documented on the 25th, area healed and no open areas noted with foam dressing in place.  All needs met, call bell in reach.

## 2019-09-15 NOTE — TOC Transition Note (Signed)
Transition of Care [facility]) - CM/SW Discharge Note   Patient Details  Name: Timothy Le MRN: [id number] Date of Birth: Mar 20, 1960  [facility]) CM/SW Contact:  Zenon Mayo, RN Phone Number: 09/15/2019, 12:23 PM   Clinical Narrative:    DC to CIR today.   Final next level of care: IP Rehab Facility Barriers to Discharge: No Barriers Identified   Patient Goals and CMS Choice        Discharge Placement                       Discharge Plan and Services   Discharge Planning Services: CM Consult              DME Agency: NA       HH Arranged: NA          Social Determinants of Health (SDOH) Interventions     Readmission Risk Interventions Readmission Risk Prevention Plan 09/15/2019  Transportation Screening Complete  Medication Review ([place]) Complete  PCP or Specialist appointment within 3-5 days of discharge Complete  HRI or Home Care Consult Complete  SW Recovery Care/Counseling Consult Complete  [place] Complete  Some recent data might be hidden

## 2019-09-15 NOTE — Procedures (Signed)
Tracheostomy Exchange Procedure Note  Timothy Le  [id number]  1960-05-13  Date:09/15/19  Time:12:31 PM   Provider Performing:Pete E Kary Kos   Procedure: Tracheostomy Exchange Through Immature Stoma [phone])  Indication(s) routine  Consent Risks of the procedure as well as the alternatives and risks of each were explained to the patient and/or caregiver.  Consent for the procedure was obtained and is signed in the bedside chart  Anesthesia None   Time Out Verified patient identification, verified procedure, site/side was marked, verified correct patient position, special equipment/implants available, medications/allergies/relevant history reviewed, required imaging and test results available.   Sterile Technique Hand hygiene, gloves   Procedure Description Size 6 cuffed existing Shiley removed and size 5 uncuffed proximal xlt Shiley placed through stoma.   Complications/Tolerance None; patient tolerated the procedure well..   EBL Minimal   Erick Colace ACNP-BC [place] Pager # [phone] OR # [phone] if no answer

## 2019-09-16 ENCOUNTER — Inpatient Hospital Stay (HOSPITAL_COMMUNITY): Payer: Medicaid Other

## 2019-09-16 ENCOUNTER — Inpatient Hospital Stay (HOSPITAL_COMMUNITY): Payer: Medicaid Other | Admitting: Physical Therapy

## 2019-09-16 DIAGNOSIS — R5381 Other malaise: Principal | ICD-10-CM

## 2019-09-16 LAB — COMPREHENSIVE METABOLIC PANEL
ALT: 34 U/L (ref 0–44)
AST: 30 U/L (ref 15–41)
Albumin: 2.7 g/dL — ABNORMAL LOW (ref 3.5–5.0)
Alkaline Phosphatase: 66 U/L (ref 38–126)
Anion gap: 11 (ref 5–15)
BUN: 24 mg/dL — ABNORMAL HIGH (ref 6–20)
CO2: 32 mmol/L (ref 22–32)
Calcium: 8.8 mg/dL — ABNORMAL LOW (ref 8.9–10.3)
Chloride: 99 mmol/L (ref 98–111)
Creatinine, Ser: 0.95 mg/dL (ref 0.61–1.24)
GFR calc Af Amer: >60 mL/min (ref >60)
GFR calc non Af Amer: >60 mL/min (ref >60)
Glucose, Bld: 131 mg/dL — ABNORMAL HIGH (ref 70–99)
Potassium: 3 mmol/L — ABNORMAL LOW (ref 3.5–5.1)
Sodium: 142 mmol/L (ref 135–145)
Total Bilirubin: 0.2 mg/dL — ABNORMAL LOW (ref 0.3–1.2)
Total Protein: 6.8 g/dL (ref 6.5–8.1)

## 2019-09-16 LAB — CBC WITH DIFFERENTIAL/PLATELET
Abs Immature Granulocytes: 0.09 10*3/uL — ABNORMAL HIGH (ref 0.00–0.07)
Basophils Absolute: 0 10*3/uL (ref 0.0–0.1)
Basophils Relative: 1 %
Eosinophils Absolute: 0.2 10*3/uL (ref 0.0–0.5)
Eosinophils Relative: 3 %
HCT: 31.3 % — ABNORMAL LOW (ref 39.0–52.0)
Hemoglobin: 9.7 g/dL — ABNORMAL LOW (ref 13.0–17.0)
Immature Granulocytes: 2 %
Lymphocytes Relative: 40 %
Lymphs Abs: 2.4 10*3/uL (ref 0.7–4.0)
MCH: 28 pg (ref 26.0–34.0)
MCHC: 31 g/dL (ref 30.0–36.0)
MCV: 90.2 fL (ref 80.0–100.0)
Monocytes Absolute: 0.4 10*3/uL (ref 0.1–1.0)
Monocytes Relative: 7 %
Neutro Abs: 2.9 10*3/uL (ref 1.7–7.7)
Neutrophils Relative %: 47 %
Platelets: 302 10*3/uL (ref 150–400)
RBC: 3.47 MIL/uL — ABNORMAL LOW (ref 4.22–5.81)
RDW: 16 % — ABNORMAL HIGH (ref 11.5–15.5)
WBC: 6 10*3/uL (ref 4.0–10.5)
nRBC: 0 % (ref 0.0–0.2)

## 2019-09-16 LAB — GLUCOSE, CAPILLARY
Glucose-Capillary: 124 mg/dL — ABNORMAL HIGH (ref 70–99)
Glucose-Capillary: 123 mg/dL — ABNORMAL HIGH (ref 70–99)
Glucose-Capillary: 151 mg/dL — ABNORMAL HIGH (ref 70–99)
Glucose-Capillary: 138 mg/dL — ABNORMAL HIGH (ref 70–99)

## 2019-09-16 MED ORDER — POTASSIUM CHLORIDE CRYS ER 20 MEQ PO TBCR
20.0000 meq | EXTENDED_RELEASE_TABLET | Freq: Every day | ORAL | Status: DC
  Administered 2019-09-17 – 2019-09-18 (×2): 20 meq via ORAL
  Filled 2019-09-16 (×2): qty 1

## 2019-09-16 MED ORDER — POTASSIUM CHLORIDE CRYS ER 20 MEQ PO TBCR
40.0000 meq | EXTENDED_RELEASE_TABLET | Freq: Four times a day (QID) | ORAL | Status: AC
  Administered 2019-09-16 (×2): 40 meq via ORAL
  Filled 2019-09-16 (×2): qty 2

## 2019-09-16 NOTE — Progress Notes (Addendum)
[place] PHYSICAL MEDICINE & REHABILITATION PROGRESS NOTE   Subjective/Complaints: Feeling sleepy this morning. Would like to get back into bed. Called aide and she is helping him. K+ 3.0 No respiratory distress  ROS: Denies SOB, + dysuria   Objective:   No results found. Recent Labs    09/16/19 0315  WBC 6.0  HGB 9.7*  HCT 31.3*  PLT 302   Recent Labs    09/16/19 0315  NA 142  K 3.0*  CL 99  CO2 32  GLUCOSE 131*  BUN 24*  CREATININE 0.95  CALCIUM 8.8*    Intake/Output Summary (Last 24 hours) at 09/16/2019 1032 Last data filed at 09/16/2019 0902 Gross per 24 hour  Intake 240 ml  Output 500 ml  Net -260 ml     Physical Exam: Vital Signs Blood pressure 117/70, pulse 76, temperature 99.4 F (37.4 C), resp. rate 20, SpO2 96 %.  General: Alert and oriented x 3, No apparent distress HEENT: Head is normocephalic, atraumatic, PERRLA, EOMI, sclera anicteric, oral mucosa pink and moist, dentition intact, ext ear canals clear,  Neck: Supple without JVD or lymphadenopathy Heart: Reg rate and rhythm. No murmurs rubs or gallops Chest: CTA bilaterally without wheezes, rales, or rhonchi; no distress Abdomen: Soft, non-tender, non-distended, bowel sounds positive. Extremities: No clubbing, cyanosis, or edema. Pulses are 2+ Skin: Stage 2 pressure injury to buttocks with open area Neuro: Pt is cognitively appropriate with normal insight, memory, and awareness. Cranial nerves 2-12 are intact. Fine motor coordination is intact. No tremors.5/5 strength throughout.  Psych: Pt's affect is cheerful. Pt is cooperative  Assessment/Plan: 1. Functional deficits secondary to debility which require 3+ hours per day of interdisciplinary therapy in a comprehensive inpatient rehab setting.  Physiatrist is providing close team supervision and 24 hour management of active medical problems listed below.  Physiatrist and rehab team continue to assess barriers to discharge/monitor patient  progress toward functional and medical goals  Care Tool:  Bathing              Bathing assist       Upper Body Dressing/Undressing Upper body dressing   What is the patient wearing?: Hospital gown only    Upper body assist Assist Level: Independent with assistive device    Lower Body Dressing/Undressing Lower body dressing            Lower body assist       Toileting Toileting    Toileting assist Assist for toileting: Independent (urinal)     Transfers Chair/bed transfer  Transfers assist  Chair/bed transfer activity did not occur: Safety/medical concerns        Locomotion Ambulation   Ambulation assist   Ambulation activity did not occur: Safety/medical concerns          Walk 10 feet activity   Assist  Walk 10 feet activity did not occur: Safety/medical concerns        Walk 50 feet activity   Assist Walk 50 feet with 2 turns activity did not occur: Safety/medical concerns         Walk 150 feet activity   Assist Walk 150 feet activity did not occur: Safety/medical concerns         Walk 10 feet on uneven surface  activity   Assist Walk 10 feet on uneven surfaces activity did not occur: Safety/medical concerns         Wheelchair     Assist Will patient use wheelchair at discharge?:  (TBD)  Wheelchair activity did not occur: Safety/medical concerns         Wheelchair 50 feet with 2 turns activity    Assist    Wheelchair 50 feet with 2 turns activity did not occur: Safety/medical concerns       Wheelchair 150 feet activity     Assist  Wheelchair 150 feet activity did not occur: Safety/medical concerns       Blood pressure 117/70, pulse 76, temperature 99.4 F (37.4 C), resp. rate 20, SpO2 96 %.  Medical Problem List and Plan: 1.  Impaired mobility and ADLs secondary to cardiac and pulmonary debility             -patient may sponge bathe             -ELOS/Goals: 7-8 days modI  -Initial  CIR evals today 2.  Antithrombotics: -DVT/anticoagulation:  Pharmaceutical: Lovenox             -antiplatelet therapy: N/A 3. Pain Management: Gabapentin 200mg  TID effective for neuropathy. Was on oxycodone 10mg  q6H PRN (using sparingly- decrease to 5mg  q6H PRN). Has PRN tylenol.   8/28: Used one oxycodone last night.  4. Mood: On Seroquel 100mg  BID and Zoloft 100mg  daily.  5. Neuropsych: This patient is capable of making decisions on his own behalf. 6. Skin/Wound Care: Routine tracheostomy care. Continue Juven bid with multivitamin.  7. Fluids/Electrolytes/Nutrition:  8/28: Hypokalemic to 3. 12meq x2 today and restart home 27meq daily. Repeat Monday.  8.. T2DM:  On Lantus 30 with novolog 8-10 units tid with meals. Intake has been good--continue to monitor BS ac/hs. Will change levemir to lantus bid. Add CM restrictions to diet.   8/28: labile: continue to monitor.  9. COPD: Used MDI with nebs prn wheezing PTA. Has not had to use oxygen for past couple of years. Resume  MDI. Continue hypertonic saline nebs.  10. Chronic CHF: Monitor for signs of overload and check weights daily. Continue lopressor bid with demadex,  11. Dysuria: Foley d/c today-- will order UA/UCS to rule out infection.  8/28: UA with high glucose but no evidence of infection. Encourage fluid intake. Culture pending 12. Anxiety: Managed by Zoloft and Seroquel bid.  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 Discussed with therapists.  >35 minutes spent in physical examination of patient, review on his medications and labs, supplementing his potassium, review of his notes, review of his CBGs, review of his UA and discussion with patient, discussion with therapists regarding maintaining of trach, asking RT to see patient.      LOS: 1 days A FACE TO FACE EVALUATION WAS PERFORMED  Timothy Le 09/16/2019, 10:32 AM

## 2019-09-16 NOTE — Evaluation (Addendum)
Physical Therapy Assessment and Plan  Patient Details  Name: Timothy Le MRN: [id number] Date of Birth: 22-May-1960  PT Diagnosis: Abnormality of gait, Difficulty walking, Impaired sensation and Muscle weakness Rehab Potential: Good ELOS: 1-1.5 weeks   Today's Date: 09/16/2019 PT Individual Time: 0806-0906 PT Individual Time Calculation (min): 60 min    Hospital Problem: Principal Problem:   Debility   Past Medical History:  Past Medical History:  Diagnosis Date  . Asthma   . Chronic respiratory failure with hypoxia ([place]) 07/09/2017  . COPD (chronic obstructive pulmonary disease) ([place])   . Diabetes mellitus without complication ([place])   . Difficult intubation   . Hypertension   . Shortness of breath dyspnea   . Sleep apnea    Past Surgical History:  Past Surgical History:  Procedure Laterality Date  . IR GASTROSTOMY TUBE MOD SED  07/05/2017  . THYROIDECTOMY Left 08/30/2019   Procedure: THYROIDECTOMY CERVICAL LIPECTOMY;  Surgeon: Izora Gala, MD;  Location: [facility];  Service: ENT;  Laterality: Left;  NEEDS RNFA PLEASE  . TRACHEOSTOMY TUBE PLACEMENT N/A 06/17/2017   Procedure: TRACHEOSTOMY;  Surgeon: Leta Baptist, MD;  Location: [facility];  Service: ENT;  Laterality: N/A;  . TRACHEOSTOMY TUBE PLACEMENT N/A 08/14/2019   Procedure: TRACHEOSTOMY;  Surgeon: Izora Gala, MD;  Location: [facility];  Service: ENT;  Laterality: N/A;  . TRACHEOSTOMY TUBE PLACEMENT N/A 08/30/2019   Procedure: TRACHEOSTOMY  REVISION;  Surgeon: Izora Gala, MD;  Location: [facility];  Service: ENT;  Laterality: N/A;    Assessment & Plan Clinical Impression: Patient is a 59 y.o. year old male with recent admission to the hospital on 59 year old male with history of T2DM, OSA, morbid obesity-BMI 41.98, COPD,  tracheal stenosis and prior tracheostomy who was admitted on 07/30/19 with reports of BIPAP not working for 2 weeks,acute on chronic respiratory failure and was unresponsive requiring intubation. Hospital course  significant for electrolyte abnormalities, hyperglycemia as well as inability to tolerate extubation requiring emergent tracheostomy by Dr. Grandville Silos.  ENT consulted for input due to large thyroid goiter and felt that patient would need a thyroidectomy prior to decannulation.  Trach did dislodge on 07/24 due to agitation requiring emergent reintubation and he underwent tracheostomy by Dr. Constance Holster on 07/26.  He has had issues with recurrent mucous plug and bradycardic arrest treated with ACLS protocol on 08/09.    He underwent thyroidectomy, cervical lipectomy and tracheostomy revision on 08/11.  He had significant bleeding postop requiring 2 units PRBC.  He was tolerated extubation and was started on regular diet by extubated and started on regular diet by 08/16.  Respiratory status stable and chronic diastolic CHF compensated--Cardura and metaxalone on home.  Gabapentin added to help manage neuropathic symptoms. Trach downsized to 5 XLT today.  Therapy ongoing and CIR recommended due to debility.  Patient transferred to CIR on 09/15/2019 .   Patient currently requires min with mobility secondary to muscle weakness, decreased cardiorespiratoy endurance and decreased oxygen support, and decreased standing balance, decreased postural control and decreased balance strategies.  Prior to hospitalization, patient was independent  with mobility and lived with Alone in a [place] home.  Home access is  Level entry.  Patient will benefit from skilled PT intervention to maximize safe functional mobility, minimize fall risk and decrease caregiver burden for planned discharge home alone.  Anticipate patient will benefit from follow up [name] at discharge.  PT - End of Session Activity Tolerance: Tolerates 30+ min activity with multiple rests Endurance Deficit:  Yes Endurance Deficit Description: 2/2 decreased cardiopulmonary endurance & generalized deconditioning PT Assessment Rehab Potential (ACUTE/IP ONLY): Good PT  Barriers to Discharge: Lack of/limited family support PT Patient demonstrates impairments in the following area(s): Balance;Pain;Endurance;Sensory;Motor PT Transfers Functional Problem(s): Car;Furniture;Bed to Chair;Bed Mobility PT Locomotion Functional Problem(s): Ambulation;Wheelchair Mobility;Stairs PT Plan PT Intensity: Minimum of 1-2 x/day ,45 to 90 minutes PT Frequency: 5 out of 7 days PT Duration Estimated Length of Stay: 1-1.5 weeks PT Treatment/Interventions: Ambulation/gait training;Discharge planning;DME/adaptive equipment instruction;Functional mobility training;Pain management;Psychosocial support;Therapeutic Activities;UE/LE Strength taining/ROM;Wheelchair propulsion/positioning;UE/LE Coordination activities;Therapeutic Exercise;Stair training;Skin care/wound management;Patient/family education;Neuromuscular re-education;Disease management/prevention;Balance/vestibular training;Community reintegration PT Transfers Anticipated Outcome(s): mod I with LRAD PT Locomotion Anticipated Outcome(s): mod I household ambulation with lRAD PT Recommendation Recommendations for Other Services: Neuropsych consult;Therapeutic Recreation consult Therapeutic Recreation Interventions: Stress management Follow Up Recommendations: Home health PT Patient destination: Home Equipment Recommended: To be determined   PT Evaluation Precautions/Restrictions Precautions Precautions: Fall Precaution Comments: trach collar, PMV Restrictions Weight Bearing Restrictions: No  General Chart Reviewed: Yes Additional Pertinent History: DM2, OSA, morbid obesity, COPD, tracheal stenosis & prior trach, asthma, chronic respiratory failure with hypoxia, HTN, dyspnea Response to Previous Treatment: Not applicable Family/Caregiver Present: No   Pain Pt reports unrated tingling in B feet (began when he was admitted to hospital) - pt reports he's receiving medication for it Also c/o unrated pain in BLE after  toileting, rest break provided upon request.   Home Living/Prior Functioning Home Living Available Help at Discharge: Family;Available PRN/intermittently Type of Home: Apartment Home Access: Level entry Home Layout: One level Bathroom Shower/Tub: [employer] officer, [employer] Accessibility: Yes Additional Comments: niece checks on him daily  Lives With: Alone Prior Function Level of Independence: Independent with transfers;Independent with basic ADLs;Independent with gait Driving: No (uses [employer] for transportation to appointments)  Vision/Perception  Pt wears glasses for watching TV but reports they are broken right now.  Pt denies changes in baseline vision.  Cognition Overall Cognitive Status: Within Functional Limits for tasks assessed Arousal/Alertness: Awake/alert Orientation Level: Oriented X4 Memory: Appears intact Awareness: Appears intact Problem Solving: Appears intact Safety/Judgment: Appears intact   Sensation Sensation Light Touch:  (pt able to correctly identify where PT touched toes, but does report hx of neuropathy) Proprioception: Appears Intact (BLE) Coordination Fine Motor Movements are Fluid and Coordinated: Yes Heel Shin Test: impaired fluidity but equal BLE   Motor  Motor Motor: Abnormal postural alignment and control Motor - Skilled Clinical Observations: generalized deconditioning   Trunk/Postural Assessment  Cervical Assessment Cervical Assessment: Exceptions to [facility] (forward head) Thoracic Assessment Thoracic Assessment: Exceptions to [facility] (rounded shoulders) Lumbar Assessment Lumbar Assessment: Exceptions to [facility] (posterior pelvic tilt) Postural Control Postural Control: Deficits on evaluation Righting Reactions: delayed Protective Responses: delayed   Balance Balance Balance Assessed: Yes Static Sitting Balance Static Sitting - Balance Support: Feet supported Static Sitting - Level of Assistance: 6: Modified independent  (Device/Increase time) Static Standing Balance Static Standing - Balance Support: During functional activity;Bilateral upper extremity supported Static Standing - Level of Assistance: 4: Min assist   Extremity Assessment  RUE Assessment RUE Assessment: Within Functional Limits LUE Assessment LUE Assessment: Within Functional Limits RLE Assessment RLE Assessment: Not tested General Strength Comments: generalized weakness, but able to weight bear without buckling noted with use of RW for BUE support LLE Assessment LLE Assessment: Not tested General Strength Comments: generalized weakness, but able to weight bear without buckling noted with use of RW for BUE support  Care Tool Care Tool Bed Mobility Roll left and  right activity   Roll left and right assist level: Supervision/Verbal cueing (with bed features)    Sit to lying activity   Sit to lying assist level: Supervision/Verbal cueing (with bed features)    Lying to sitting edge of bed activity   Lying to sitting edge of bed assist level: Supervision/Verbal cueing (with bed features)     Care Tool Transfers Sit to stand transfer   Sit to stand assist level: Contact Guard/Touching assist    Chair/bed transfer Chair/bed transfer activity did not occur: Safety/medical concerns       Toilet transfer   Assist Level: Contact Guard/Touching Occupational hygienist transfer activity did not occur: Safety/medical concerns        Care Tool Locomotion Ambulation Ambulation activity did not occur: Safety/medical concerns        Walk 10 feet activity Walk 10 feet activity did not occur: Safety/medical concerns       Walk 50 feet with 2 turns activity Walk 50 feet with 2 turns activity did not occur: Safety/medical concerns      Walk 150 feet activity Walk 150 feet activity did not occur: Safety/medical concerns      Walk 10 feet on uneven surfaces activity Walk 10 feet on uneven surfaces activity did not occur:  Safety/medical concerns      Stairs Stair activity did not occur: Safety/medical concerns        Walk up/down 1 step activity Walk up/down 1 step or curb (drop down) activity did not occur: Safety/medical concerns     Walk up/down 4 steps activity did not occuR: Safety/medical concerns  Walk up/down 4 steps activity      Walk up/down 12 steps activity Walk up/down 12 steps activity did not occur: Safety/medical concerns      Pick up small objects from floor Pick up small object from the floor (from standing position) activity did not occur: Safety/medical concerns      Wheelchair Will patient use wheelchair at discharge?:  (TBD)   Wheelchair activity did not occur: Safety/medical concerns      Wheel 50 feet with 2 turns activity Wheelchair 50 feet with 2 turns activity did not occur: Safety/medical concerns    Wheel 150 feet activity Wheelchair 150 feet activity did not occur: Safety/medical concerns      Refer to Care Plan for Long Term Goals  SHORT TERM GOAL WEEK 1 PT Short Term Goal 1 (Week 1): STG = LTG due to short ELOS.  Recommendations for other services: Neuropsych and Therapeutic Recreation  Stress management  Skilled Therapeutic Intervention Pt received sitting on EOB & agreeable to tx. Pt on 5L/min, FiO2 at 28% via trach collar without PMV donned. After discussing with nurse & with nurse clearance PT removed trach collar so pt on room air during session & pt's SpO2 remained >or= 90% throughout. PT educated pt on ELOS, daily therapy schedule, weekly team meetings, and other various CIR information. Provided pt with w/c & cushion to promote OOB tolerance & prevent skin breakdown. Pt completes sit<>stand transfers with CGA & ambulates in room & to bathroom with RW & min assist. Pt completes toilet transfer with CGA & grab bar & has continent void & BM on toilet, performing peri hygiene without assistance. At end of session pt left in w/c with chair alarm donned, call  bell in reach. Pt left on room air & SpO2 >90%, & nurse made aware, reporting she is about to go check on pt &  will re don trach collar.   No c/o SOB from pt during session.  Mobility Bed mobility: roll L<>R, supine<>sit with supervision with hospital bed features (per OT report) Transfers Transfers: Sit to Stand;Stand to Sit Sit to Stand: Contact Guard/Touching assist Stand to Sit: Contact Guard/Touching assist Transfer (Assistive device): Rolling walker  Locomotion  Gait Gait Assistance: Minimal Assistance - Patient > 75% Gait Distance (Feet): 20 Feet Assistive device: Rolling walker Gait Assistance Details: Verbal cues for precautions/safety Gait Gait: Yes Gait Pattern: Decreased stride length Gait velocity: decreased Stairs / Additional Locomotion Stairs: No Wheelchair Mobility Wheelchair Mobility: No   Discharge Criteria: Patient will be discharged from PT if patient refuses treatment 3 consecutive times without medical reason, if treatment goals not met, if there is a change in medical status, if patient makes no progress towards goals or if patient is discharged from hospital.  The above assessment, treatment plan, treatment alternatives and goals were discussed and mutually agreed upon: by patient  [name] 09/16/2019, 12:30 PM

## 2019-09-16 NOTE — Progress Notes (Signed)
Pt asleep at time of scheduled trach check. No obvious respiratory distress noted at this time. RT will continue to monitor.

## 2019-09-16 NOTE — Progress Notes (Signed)
Occupational Therapy Session Note  Patient Details  Name: Timothy Le MRN: [id number] Date of Birth: 1960-05-09  Today's Date: 09/16/2019 OT Individual Time: 4008-6761 OT Individual Time Calculation (min): 55 min    Short Term Goals: Week 1:  OT Short Term Goal 1 (Week 1): STG=LTG d/t ELOS  Skilled Therapeutic Interventions/Progress Updates:  Balance/vestibular training;Discharge planning;Pain management;Self Care/advanced ADL retraining;Therapeutic Activities;UE/LE Coordination activities;Visual/perceptual remediation/compensation;Therapeutic Exercise;Skin care/wound managment;Patient/family education;Functional mobility training;Disease mangement/prevention;Community reintegration;DME/adaptive equipment instruction;Neuromuscular re-education;Psychosocial support;Splinting/orthotics;Wheelchair propulsion/positioning;UE/LE Strength taining/ROM 1:1. Pt received in bed agreeable to Ot after arousal. Pt reporting medications causing pt to be drowsy. Pt completes supine>sitting with bed rails and S. Pt completes doffing and donning socks EOB with CGA for sitting balnacce on air mattress. OT suggests moving to recliner to attempt to donning shoes. Pt able ot complete with S and no AE, however pt may benefit from AE training for energy conservation. tp completes ambulatory transfer to w/c with RW and MIN A and to nustep for endurance training. Pt completes 3x3 min nustep on work loard 1 and 3 for last 2 intervals with 1 min rest break in between. Average steps per minute 80-95. 666 steps over 9 min total. Pt reporting fatigue and requesting to return to room. Upon return pt reporting need to toilet. Pt ambulates with RW into/out of bathroom with MIN A and MOD A for anterior LOB exiting bathroom after toileting. Pt with mildly ataxic gait/trunk when rushing d/t urgency. Pt completes 3/3 components of toileting with min A. Exited session with p seated in recliner, exit alarm on and call light in  reach  Therapy Documentation Precautions:  Precautions Precautions: Fall Precaution Comments: trach collar, PMV Restrictions Weight Bearing Restrictions: No General: General Chart Reviewed: Yes Family/Caregiver Present: No Vital Signs:   Pain: Pain Assessment Pain Score: 0-No pain ADL: ADL Grooming: Setup Where Assessed-Grooming: Sitting at sink Upper Body Bathing: Supervision/safety Where Assessed-Upper Body Bathing: Sitting at sink Lower Body Bathing: Minimal assistance Where Assessed-Lower Body Bathing: Sitting at sink Upper Body Dressing: Setup Where Assessed-Upper Body Dressing: Sitting at sink Lower Body Dressing: Moderate assistance Where Assessed-Lower Body Dressing: Sitting at sink, Standing at sink Toileting: Minimal assistance Where Assessed-Toileting: Bedside Commode Toilet Transfer: Minimal assistance Toilet Transfer Method: Stand pivot Vision Patient Visual Report: No change from baseline Vision Assessment?: No apparent visual deficits Perception  Perception: Within Functional Limits Praxis Praxis: Intact Exercises:   Other Treatments:     Therapy/Group: Individual Therapy  Tonny Branch 09/16/2019, 1:02 PM

## 2019-09-16 NOTE — Progress Notes (Signed)
Speech Language Pathology Note  Patient Details  Name: Timothy Le MRN: [id number] Date of Birth: 1960-12-25 Today's Date: 09/16/2019  Pt was signed off by acute ST for mod I use of PMSV and tolerance of least restrictive diet. SLP communicated with on call MD, no further ST concerns. No ST services warranted during CIR stay at this time.   Timothy Le  [facility] 09/16/2019, 11:40 AM

## 2019-09-16 NOTE — Progress Notes (Signed)
Physical Therapy Session Note  Patient Details  Name: Timothy Le MRN: [id number] Date of Birth: [birth year]  Today's Date: 09/16/2019 PT Individual Time: 2725-3664 PT Individual Time Calculation (min): 29 min   Short Term Goals: Week 1:  PT Short Term Goal 1 (Week 1): STG = LTG due to short ELOS.  Skilled Therapeutic Interventions/Progress Updates:  Pt received in bed & agreeable to tx. Supine<>sit with hospital bed features & supervision. Sit>stand with CGA, pt with 1 episode of poor eccentric lowering stand>sit on EOB at end of session with max assist to correct. Gait in room with RW & min & in gym up to 40 ft with pt reporting need for seated rest break 2/2 L>R LE fatigue & feeling like it will give out. Pt demonstrates BUE ataxia & LLE ataxia during gait. Pt propels w/c 30 ft with BUE and supervision, then BLE & supervision for strengthening. Pt on 5L/min 28% FiO2 via trach collar at beginning & end of session, trach collar removed & on room air during session with PMSV donned. After ambulating around bed to w/c SpO2 dropped to 86% but very quickly increased to >90% with education for pursed lip breathing; SpO2 remained >90% throughout session after that. Pt left in bed with alarm set, call bell in reach at end of session.  PT does assist pt with donning B shoes, doffing R shoe.   Therapy Documentation Precautions:  Precautions Precautions: Fall Precaution Comments: trach collar, PMV Restrictions Weight Bearing Restrictions: No  Pain: No c/o pain, reports LLE weakness.  Therapy/Group: Individual Therapy  [name] 09/16/2019, 1:43 PM

## 2019-09-16 NOTE — Progress Notes (Signed)
Occupational Therapy Assessment and Plan  Patient Details  Name: Timothy Le MRN: [id number] Date of Birth: 1960-02-19  OT Diagnosis: abnormal posture, acute pain, cognitive deficits and muscle weakness (generalized) Rehab Potential:   ELOS: 7-10   Today's Date: 09/16/2019 OT Individual Time: 1100-1200 OT Individual Time Calculation (min): 60 min     Hospital Problem: Principal Problem:   Debility   Past Medical History:  Past Medical History:  Diagnosis Date   Asthma    Chronic respiratory failure with hypoxia ([place]) 07/09/2017   COPD (chronic obstructive pulmonary disease) (HCC)    Diabetes mellitus without complication (HCC)    Difficult intubation    Hypertension    Shortness of breath dyspnea    Sleep apnea    Past Surgical History:  Past Surgical History:  Procedure Laterality Date   IR GASTROSTOMY TUBE MOD SED  07/05/2017   THYROIDECTOMY Left 08/30/2019   Procedure: THYROIDECTOMY CERVICAL LIPECTOMY;  Surgeon: Izora Gala, MD;  Location: [place];  Service: ENT;  Laterality: Left;  NEEDS RNFA PLEASE   TRACHEOSTOMY TUBE PLACEMENT N/A 06/17/2017   Procedure: TRACHEOSTOMY;  Surgeon: Leta Baptist, MD;  Location: [place];  Service: ENT;  Laterality: N/A;   TRACHEOSTOMY TUBE PLACEMENT N/A 08/14/2019   Procedure: TRACHEOSTOMY;  Surgeon: Izora Gala, MD;  Location: [place];  Service: ENT;  Laterality: N/A;   TRACHEOSTOMY TUBE PLACEMENT N/A 08/30/2019   Procedure: TRACHEOSTOMY  REVISION;  Surgeon: Izora Gala, MD;  Location: [facility] OR;  Service: ENT;  Laterality: N/A;    Assessment & Plan Clinical Impression:   Pt is 59 year old African-American male with PMH significant for DM 2, HTN, COPD on home oxygen, morbid obesity, obstructive sleep apnea. Patient presented to the ED on 07/30/2019 with worsening shortness of breath. Initial work-up suggested pulmonary edema and decompensated diastolic heart failure. He was intubated in the ED and admitted to ICU.  He required mechanical  ventilation for most of the hospitalization. On 8/11 he underwent total thyroidectomy and cervical lipectomy with suturing of skin flaps to to trachea for tracheostomy. On 09/05/19 pt tx to ICU due to mucus plug.  Patient currently requires min with basic self-care skills secondary to muscle weakness, decreased cardiorespiratoy endurance and decreased oxygen support and decreased sitting balance, decreased standing balance, decreased postural control and decreased balance strategies.  Prior to hospitalization, patient could complete BADL/IADL with modified independent .  Patient will benefit from skilled intervention to decrease level of assist with basic self-care skills and increase independence with basic self-care skills prior to discharge home independently.  Anticipate patient will require intermittent supervision and follow up home health.  OT - End of Session Activity Tolerance: Tolerates 30+ min activity without fatigue Endurance Deficit: Yes Endurance Deficit Description: 2/2 decreased cardiopulmonary endurance & generalized deconditioning OT Assessment Rehab Potential (ACUTE ONLY): Good OT Barriers to Discharge: Decreased caregiver support;Lack of/limited family support;New oxygen OT Patient demonstrates impairments in the following area(s): Balance;Endurance;Motor;Safety;Skin Integrity OT Basic ADL's Functional Problem(s): Grooming;Bathing;Dressing;Toileting OT Advanced ADL's Functional Problem(s): Simple Meal Preparation;Laundry OT Transfers Functional Problem(s): Toilet;Tub/Shower OT Plan OT Intensity: Minimum of 1-2 x/day, 45 to 90 minutes OT Frequency: 5 out of 7 days OT Duration/Estimated Length of Stay: 7-10 OT Treatment/Interventions: Balance/vestibular training;Discharge planning;Pain management;Self Care/advanced ADL retraining;Therapeutic Activities;UE/LE Coordination activities;Visual/perceptual remediation/compensation;Therapeutic Exercise;Skin care/wound  managment;Patient/family education;Functional mobility training;Disease mangement/prevention;Community reintegration;DME/adaptive equipment instruction;Neuromuscular re-education;Psychosocial support;Splinting/orthotics;Wheelchair propulsion/positioning;UE/LE Strength taining/ROM OT Self Feeding Anticipated Outcome(s): MOD I OT Basic Self-Care Anticipated Outcome(s): MOD I OT Toileting Anticipated Outcome(s): MOD I OT Bathroom Transfers  Anticipated Outcome(s): MOD I OT Recommendation Patient destination: Home Follow Up Recommendations: Home health OT Equipment Recommended: None recommended by OT   OT Evaluation Precautions/Restrictions  Precautions Precautions: Fall Precaution Comments: trach collar, PMV Restrictions Weight Bearing Restrictions: No General Chart Reviewed: Yes Family/Caregiver Present: No Vital Signs Therapy Vitals Pulse Rate: 76 Resp: 20 Patient Position (if appropriate): Sitting Oxygen Therapy SpO2: 96 % O2 Device: Room Air O2 Flow Rate (L/min): 55 L/min FiO2 (%): 28 % Pain Pain Assessment Pain Scale: 0-10 Pain Score: 0-No pain Home Living/Prior Functioning Home Living Available Help at Discharge: Family, Available PRN/intermittently Type of Home: Apartment Home Access: Level entry Home Layout: One level Bathroom Shower/Tub: Chiropodist: Handicapped height Bathroom Accessibility: Yes Additional Comments: niece checks on him daily  Lives With: Alone IADL History Leisure and Hobbies: Watching football Prior Function Level of Independence: Independent with transfers, Independent with basic ADLs, Independent with gait Driving: No Vision Patient Visual Report: No change from baseline Vision Assessment?: No apparent visual deficits Perception  Perception: Within Functional Limits Praxis Praxis: Intact Cognition Overall Cognitive Status: Within Functional Limits for tasks assessed Arousal/Alertness: Awake/alert Orientation  Level: Person;Place;Situation Person: Oriented Place: Oriented Situation: Oriented Year: 2021 Month: August Memory: Appears intact Awareness: Appears intact Problem Solving: Appears intact Safety/Judgment: Appears intact Sensation Sensation Light Touch:  (pt able to correctly identify where PT touched toes, but does report hx of neuropathy) Proprioception: Appears Intact Coordination Fine Motor Movements are Fluid and Coordinated: Yes Heel Shin Test: impaired fluidity but equal BLE Motor  Motor Motor: Abnormal postural alignment and control Motor - Skilled Clinical Observations: generalized deconditioning  Trunk/Postural Assessment  Cervical Assessment Cervical Assessment: Exceptions to [facility] (forward head) Thoracic Assessment Thoracic Assessment: Exceptions to [facility] (rounded shoulders) Lumbar Assessment Lumbar Assessment: Exceptions to [facility] (posterior pelvic tilt) Postural Control Postural Control: Deficits on evaluation Righting Reactions: delayed Protective Responses: delayed  Balance Balance Balance Assessed: Yes Static Sitting Balance Static Sitting - Balance Support: Feet supported Static Sitting - Level of Assistance: 6: Modified independent (Device/Increase time) Static Standing Balance Static Standing - Balance Support: During functional activity;Bilateral upper extremity supported Static Standing - Level of Assistance: 4: Min assist Extremity/Trunk Assessment RUE Assessment RUE Assessment: Within Functional Limits LUE Assessment LUE Assessment: Within Functional Limits  Care Tool Care Tool Self Care Eating   Eating Assist Level: Independent    Oral Care         Bathing   Body parts bathed by patient: Right arm;Left arm;Chest;Abdomen;Front perineal area;Buttocks;Right upper leg;Left upper leg;Face   Body parts n/a: Right lower leg;Left lower leg Assist Level: Minimal Assistance - Patient > 75%    Upper Body Dressing(including orthotics)       Assist  Level: Set up assist    Lower Body Dressing (excluding footwear)     Assist for lower body dressing: Maximal Assistance - Patient 25 - 49%    Putting on/Taking off footwear   What is the patient wearing?: Non-skid slipper socks Assist for footwear: Dependent - Patient 0%       Care Tool Toileting Toileting activity   Assist for toileting: Independent     Care Tool Bed Mobility Roll left and right activity        Sit to lying activity        Lying to sitting edge of bed activity         Care Tool Transfers Sit to stand transfer   Sit to stand assist level: Contact Guard/Touching assist  Chair/bed transfer Chair/bed transfer activity did not occur: Safety/medical concerns Chair/bed transfer assist level: Minimal Assistance - Patient > 75%     Toilet transfer   Assist Level: Contact Guard/Touching assist     Care Tool Cognition Expression of Ideas and Wants Expression of Ideas and Wants: Without difficulty (complex and basic) - expresses complex messages without difficulty and with speech that is clear and easy to understand   Understanding Verbal and Non-Verbal Content Understanding Verbal and Non-Verbal Content: Understands (complex and basic) - clear comprehension without cues or repetitions   Memory/Recall Ability *first 3 days only Memory/Recall Ability *first 3 days only: Current season;Location of own room;That he or she is in a hospital/hospital unit;Staff names and faces    Refer to Care Plan for Long Term Goals  SHORT TERM GOAL WEEK 1 OT Short Term Goal 1 (Week 1): STG=LTG d/t ELOS  Recommendations for other services: None    Skilled Therapeutic Intervention 1;1. Pt received in bed familiar with this OT and CIR from previous admission. Pt completes BADL seated at sink level in shower chair with MIN A as stated below to simulate home environement. Pt requries increased A for LB dressing as pt unable to reach his feet. Pt reporting getting dressed on EOB  at home therefore recommend attempting this another sesison. Pt requires increased time to rest breaks on RA d/t fatigue however O2 >96% throughout session on RA. Exited session with pt seated in bed, exit alarm on and call lightin reach  ADL ADL Grooming: Setup Where Assessed-Grooming: Sitting at sink Upper Body Bathing: Supervision/safety Where Assessed-Upper Body Bathing: Sitting at sink Lower Body Bathing: Minimal assistance Where Assessed-Lower Body Bathing: Sitting at sink Upper Body Dressing: Setup Where Assessed-Upper Body Dressing: Sitting at sink Lower Body Dressing: Moderate assistance Where Assessed-Lower Body Dressing: Sitting at sink;Standing at sink Toileting: Minimal assistance Where Assessed-Toileting: Bedside Commode Toilet Transfer: Minimal assistance Toilet Transfer Method: Stand pivot Mobility  Transfers Sit to Stand: Contact Guard/Touching assist Stand to Sit: Contact Guard/Touching assist   Discharge Criteria: Patient will be discharged from OT if patient refuses treatment 3 consecutive times without medical reason, if treatment goals not met, if there is a change in medical status, if patient makes no progress towards goals or if patient is discharged from hospital.  The above assessment, treatment plan, treatment alternatives and goals were discussed and mutually agreed upon: by patient  Tonny Branch 09/16/2019, 11:43 AM

## 2019-09-17 ENCOUNTER — Inpatient Hospital Stay (HOSPITAL_COMMUNITY): Payer: Medicaid Other

## 2019-09-17 ENCOUNTER — Encounter (HOSPITAL_COMMUNITY): Payer: Self-pay | Admitting: Physical Medicine & Rehabilitation

## 2019-09-17 LAB — GLUCOSE, CAPILLARY
Glucose-Capillary: 115 mg/dL — ABNORMAL HIGH (ref 70–99)
Glucose-Capillary: 111 mg/dL — ABNORMAL HIGH (ref 70–99)
Glucose-Capillary: 100 mg/dL — ABNORMAL HIGH (ref 70–99)
Glucose-Capillary: 131 mg/dL — ABNORMAL HIGH (ref 70–99)

## 2019-09-17 MED ORDER — DICLOFENAC SODIUM 1 % EX GEL
2.0000 g | Freq: Four times a day (QID) | CUTANEOUS | Status: DC
  Administered 2019-09-17 – 2019-09-26 (×35): 2 g via TOPICAL
  Filled 2019-09-17 (×3): qty 100

## 2019-09-17 NOTE — Progress Notes (Signed)
[place] PHYSICAL MEDICINE & REHABILITATION PROGRESS NOTE   Subjective/Complaints: He is complaining of left knee pain. Worsened yesterday after therapy, but had been present prior. Willing to try voltaren gel. Had a corticosteroid shot 1 year ago with benefit. Cherie got ace bandage and may tape. I placed order and educated regarding icing TID  ROS: Denies SOB, + dysuria   Objective:   No results found. Recent Labs    09/16/19 0315  WBC 6.0  HGB 9.7*  HCT 31.3*  PLT 302   Recent Labs    09/16/19 0315  NA 142  K 3.0*  CL 99  CO2 32  GLUCOSE 131*  BUN 24*  CREATININE 0.95  CALCIUM 8.8*    Intake/Output Summary (Last 24 hours) at 09/17/2019 1152 Last data filed at 09/17/2019 1105 Gross per 24 hour  Intake 500 ml  Output 750 ml  Net -250 ml     Physical Exam: Vital Signs Blood pressure 125/80, pulse 82, temperature 98.5 F (36.9 C), resp. rate 20, height 6' (1.829 m), SpO2 100 %. General: Alert and oriented x 3, No apparent distress HEENT: Head is normocephalic, atraumatic, PERRLA, EOMI, sclera anicteric, oral mucosa pink and moist, dentition intact, ext ear canals clear,  Neck: Supple without JVD or lymphadenopathy Heart: Reg rate and rhythm. No murmurs rubs or gallops Chest: CTA bilaterally without wheezes, rales, or rhonchi; no distress Abdomen: Soft, non-tender, non-distended, bowel sounds positive. Extremities: No clubbing, cyanosis, or edema. Pulses are 2+ Skin: Clean and intact without signs of breakdown Extremities: Left knee with swelling in lateral compartment, tender Skin: Stage 2 pressure injury to buttocks with open area Neuro: Pt is cognitively appropriate with normal insight, memory, and awareness. Cranial nerves 2-12 are intact. Fine motor coordination is intact. No tremors.5/5 strength throughout.  Psych: Pt's affect is cheerful. Pt is cooperative  Assessment/Plan: 1. Functional deficits secondary to debility which require 3+ hours per day of  interdisciplinary therapy in a comprehensive inpatient rehab setting.  Physiatrist is providing close team supervision and 24 hour management of active medical problems listed below.  Physiatrist and rehab team continue to assess barriers to discharge/monitor patient progress toward functional and medical goals  Care Tool:  Bathing    Body parts bathed by patient: Right arm, Left arm, Chest, Abdomen, Front perineal area, Buttocks, Right upper leg, Left upper leg, Face     Body parts n/a: Right lower leg, Left lower leg   Bathing assist Assist Level: Minimal Assistance - Patient > 75%     Upper Body Dressing/Undressing Upper body dressing   What is the patient wearing?: Hospital gown only    Upper body assist Assist Level: Set up assist    Lower Body Dressing/Undressing Lower body dressing            Lower body assist Assist for lower body dressing: Maximal Assistance - Patient 25 - 49%     Toileting Toileting    Toileting assist Assist for toileting: Independent with assistive device Assistive Device Comment: urinal   Transfers Chair/bed transfer  Transfers assist  Chair/bed transfer activity did not occur: Safety/medical concerns  Chair/bed transfer assist level: Minimal Assistance - Patient > 75%     Locomotion Ambulation   Ambulation assist   Ambulation activity did not occur: Safety/medical concerns  Assist level: Minimal Assistance - Patient > 75% Assistive device: Walker-rolling Max distance: 40 ft   Walk 10 feet activity   Assist  Walk 10 feet activity did not occur: Safety/medical concerns  Assist level: Minimal Assistance - Patient > 75% Assistive device: Walker-rolling   Walk 50 feet activity   Assist Walk 50 feet with 2 turns activity did not occur: Safety/medical concerns         Walk 150 feet activity   Assist Walk 150 feet activity did not occur: Safety/medical concerns         Walk 10 feet on uneven surface   activity   Assist Walk 10 feet on uneven surfaces activity did not occur: Safety/medical concerns         Wheelchair     Assist Will patient use wheelchair at discharge?:  (TBD) Type of Wheelchair: Manual Wheelchair activity did not occur: Safety/medical concerns  Wheelchair assist level: Supervision/Verbal cueing Max wheelchair distance: 30 ft    Wheelchair 50 feet with 2 turns activity    Assist    Wheelchair 50 feet with 2 turns activity did not occur: Safety/medical concerns       Wheelchair 150 feet activity     Assist  Wheelchair 150 feet activity did not occur: Safety/medical concerns       Blood pressure 125/80, pulse 82, temperature 98.5 F (36.9 C), resp. rate 20, height 6' (1.829 m), SpO2 100 %.  Medical Problem List and Plan: 1.  Impaired mobility and ADLs secondary to cardiac and pulmonary debility             -patient may sponge bathe             -ELOS/Goals: 7-8 days modI  Continue CIR 2.  Antithrombotics: -DVT/anticoagulation:  Pharmaceutical: Lovenox             -antiplatelet therapy: N/A 3. Pain Management: Gabapentin 200mg  TID effective for neuropathy. Was on oxycodone 10mg  q6H PRN (using sparingly- decrease to 5mg  q6H PRN). Has PRN tylenol.   8/28: Used one oxycodone last night.   8/29: Left knee effusion: ace bandage, taping, ice TID, voltaren gel. Can consider cortisone injection if persists 4. Mood: On Seroquel 100mg  BID and Zoloft 100mg  daily. In positive mood now.  5. Neuropsych: This patient is capable of making decisions on his own behalf. 6. Skin/Wound Care: Routine tracheostomy care. Continue Juven bid with multivitamin.  7. Fluids/Electrolytes/Nutrition:  8/28: Hypokalemic to 3. 58meq x2 today and restart home 79meq daily. Repeat Monday.  8.. T2DM:  On Lantus 30 with novolog 8-10 units tid with meals. Intake has been good--continue to monitor BS ac/hs. Will change levemir to lantus bid. Add CM restrictions to diet.    8/29: well controlled 9. COPD: Used MDI with nebs prn wheezing PTA. Has not had to use oxygen for past couple of years. Resume  MDI. Continue hypertonic saline nebs.  10. Chronic CHF: Monitor for signs of overload and check weights daily. Continue lopressor bid with demadex,  11. Dysuria: Foley d/c today-- will order UA/UCS to rule out infection.  8/28: UA with high glucose but no evidence of infection. Encourage fluid intake. Culture pending 12. Anxiety: Managed by Zoloft and Seroquel bid.  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 Discussed with therapists. On room air satting well      LOS: 2 days A FACE TO FACE EVALUATION WAS PERFORMED  Martha Clan P Garik Diamant 09/17/2019, 11:52 AM

## 2019-09-17 NOTE — Progress Notes (Signed)
Occupational Therapy Session Note  Patient Details  Name: HIROTO SALTZMAN MRN: [id number] Date of Birth: 15-Jun-1960  Today's Date: 09/17/2019 OT Individual Time: 5701-7793 OT Individual Time Calculation (min): 64 min    Short Term Goals: Week 1:  OT Short Term Goal 1 (Week 1): STG=LTG d/t ELOS  Skilled Therapeutic Interventions/Progress Updates:    Pt received sitting in w/c with c/o chronic knee pain but agreeable to session within pain limits. Pt required max A to don shoes. Pt propelled w/c 25 ft with mod cueing for technique. Pt became fatigued and requested assistance for remainder of the way. Pt was taken to the therapy gym total A. Pt reported need to urinate and was taken back to his room quickly via w/c. Pt used urinal seated with min A. Pt was returned to the therapy gym. Pt with PMSV and on RA, SpO2 >95% throughout session and monitored intermittently. Pt able to bring up secretions during session with activity. Pt completed 8 ft of functional mobility at CGA level, fading to (S) with use of RW. Blocked practice sit <> stands from EOM with close (S) for improved ADL transfers and functional activity tolerance/cardiorespiratory endurance. Pt with very poor eccentric control and heavy use of momentum. Spo2 94% following and HR slightly elevated at 116 bpm, requiring seated rest break to recover. Pt requested to go outside for mood and psychosocial support/adjustment. Pt was brought outside for 10 min via w/c. Discussed return to IADLs upon d/c with pt including energy conservation strategies. Pt was brought back inside. He completed toileting, including ambulatory transfer at (S) level, voiding BM. Pt returned to EOB, slight desat to 91% and HR at 130 bpm. Recovering within a min on RA. Pt was left EOB with NT assisting.   Therapy Documentation Precautions:  Precautions Precautions: Fall Precaution Comments: trach collar, PMV Restrictions Weight Bearing Restrictions:  No  Therapy/Group: Individual Therapy  Curtis Sites 09/17/2019, 12:35 PM

## 2019-09-17 NOTE — Progress Notes (Signed)
Physical Therapy Session Note  Patient Details  Name: Timothy Le MRN: [id number] Date of Birth: Aug 07, 1960  Today's Date: 09/17/2019 PT Individual Time: 1108-1205 PT Individual Time Calculation (min): 57 min   Short Term Goals: Week 1:  PT Short Term Goal 1 (Week 1): STG = LTG due to short ELOS.  Skilled Therapeutic Interventions/Progress Updates:     Patient in bed on 5L/min via trach collar upon PT arrival. Patient alert and agreeable to PT session. Patient reported 6/10 L knee pain during session, RN and MD made aware. PT provided repositioning, rest breaks, and distraction as pain interventions throughout session. MD rounded during session. Patient also c/o itchiness and irritation at trach tube holder around his neck. Removed saturated dressing and trach tube holder, then cleaned patient's neck then applied clean dressing and new trach tube holder, RN aware and approved PT to perform dressing change.   Removed trach collar at beginning of session, O2 sats maintained >95% throughout session on RA with PMSV in place.  Therapeutic Activity: Bed Mobility: Patient performed supine to sit with supervision with heavy use of bed rail and HOB elevated. Patient uses momentum to sit up demonstrating unsafe mobility on the air mattress, PT guarded patient from sliding off the bed with CGA. Transfers: Patient performed sit to/from stand x1 with min A-CGA, again patient uses momentum to stand with excess movement due to body habitus. Provided verbal cues for forward weight shift, hand placement on RW, and controlled descent for safety.  Gait Training:  Patient ambulated 15 feet, limited by L knee pain, using RW with CGA. Ambulated with step-to gait pattern leading with L due to knee pain, forward trunk lean, decreased gait speed, decreased step length and height, and downward head gaze. Provided verbal cues for erect posture, paced breathing, and sequencing to reduce knee pain with step-to gait  pattern.  Wheelchair Mobility:  Patient propelled wheelchair around the room using B UE and LEs with supervision-mod I. Provided verbal cues for use of breaks throughout. Required total A for leg rest management due to body habitus during session.   Bathing/dressing: Patient requested to bath his UB at the sink during session. Required set-up assist for washing UB and min A for doffing/donning a hospital gown. Educated patient on skin integrity and care during bathing. Also applied 1-6" ACE wrap to L knee during bathing to manage L knee edema and pain with mobility.   Patient in w/c in the room, placed trach collar on 5L/min, at end of session with breaks locked and all needs within reach.    Therapy Documentation Precautions:  Precautions Precautions: Fall Precaution Comments: trach collar, PMV Restrictions Weight Bearing Restrictions: No   Therapy/Group: Individual Therapy  Yanni Quiroa L Ankita Newcomer PT, DPT  09/17/2019, 1:30 PM

## 2019-09-18 ENCOUNTER — Inpatient Hospital Stay (HOSPITAL_COMMUNITY): Payer: Medicaid Other

## 2019-09-18 DIAGNOSIS — J398 Other specified diseases of upper respiratory tract: Secondary | ICD-10-CM

## 2019-09-18 DIAGNOSIS — N390 Urinary tract infection, site not specified: Secondary | ICD-10-CM

## 2019-09-18 DIAGNOSIS — E669 Obesity, unspecified: Secondary | ICD-10-CM

## 2019-09-18 DIAGNOSIS — A499 Bacterial infection, unspecified: Secondary | ICD-10-CM

## 2019-09-18 DIAGNOSIS — E1169 Type 2 diabetes mellitus with other specified complication: Secondary | ICD-10-CM

## 2019-09-18 LAB — BASIC METABOLIC PANEL
Anion gap: 9 (ref 5–15)
BUN: 21 mg/dL — ABNORMAL HIGH (ref 6–20)
CO2: 32 mmol/L (ref 22–32)
Calcium: 8.7 mg/dL — ABNORMAL LOW (ref 8.9–10.3)
Chloride: 98 mmol/L (ref 98–111)
Creatinine, Ser: 1.11 mg/dL (ref 0.61–1.24)
GFR calc Af Amer: >60 mL/min (ref >60)
GFR calc non Af Amer: >60 mL/min (ref >60)
Glucose, Bld: 137 mg/dL — ABNORMAL HIGH (ref 70–99)
Potassium: 3.1 mmol/L — ABNORMAL LOW (ref 3.5–5.1)
Sodium: 139 mmol/L (ref 135–145)

## 2019-09-18 LAB — CBC
HCT: 32.5 % — ABNORMAL LOW (ref 39.0–52.0)
Hemoglobin: 10 g/dL — ABNORMAL LOW (ref 13.0–17.0)
MCH: 27.5 pg (ref 26.0–34.0)
MCHC: 30.8 g/dL (ref 30.0–36.0)
MCV: 89.5 fL (ref 80.0–100.0)
Platelets: 272 10*3/uL (ref 150–400)
RBC: 3.63 MIL/uL — ABNORMAL LOW (ref 4.22–5.81)
RDW: 16 % — ABNORMAL HIGH (ref 11.5–15.5)
WBC: 10 10*3/uL (ref 4.0–10.5)
nRBC: 0 % (ref 0.0–0.2)

## 2019-09-18 LAB — URINE CULTURE: Culture: 50000 — AB

## 2019-09-18 LAB — GLUCOSE, CAPILLARY
Glucose-Capillary: 123 mg/dL — ABNORMAL HIGH (ref 70–99)
Glucose-Capillary: 127 mg/dL — ABNORMAL HIGH (ref 70–99)
Glucose-Capillary: 151 mg/dL — ABNORMAL HIGH (ref 70–99)
Glucose-Capillary: 144 mg/dL — ABNORMAL HIGH (ref 70–99)

## 2019-09-18 MED ORDER — CIPROFLOXACIN HCL 250 MG PO TABS
250.0000 mg | ORAL_TABLET | Freq: Two times a day (BID) | ORAL | Status: DC
  Administered 2019-09-18: 250 mg via ORAL
  Filled 2019-09-18: qty 1

## 2019-09-18 MED ORDER — LEVOFLOXACIN 500 MG PO TABS
500.0000 mg | ORAL_TABLET | Freq: Every day | ORAL | Status: DC
  Administered 2019-09-18 – 2019-09-19 (×2): 500 mg via ORAL
  Filled 2019-09-18 (×2): qty 1

## 2019-09-18 MED ORDER — POTASSIUM CHLORIDE CRYS ER 20 MEQ PO TBCR
20.0000 meq | EXTENDED_RELEASE_TABLET | Freq: Two times a day (BID) | ORAL | Status: DC
  Administered 2019-09-18 – 2019-09-26 (×16): 20 meq via ORAL
  Filled 2019-09-18 (×16): qty 1

## 2019-09-18 MED ORDER — NYSTATIN 100000 UNIT/ML MT SUSP
5.0000 mL | Freq: Four times a day (QID) | OROMUCOSAL | Status: AC
  Administered 2019-09-18 – 2019-09-20 (×6): 500000 [IU] via ORAL
  Filled 2019-09-18 (×8): qty 5

## 2019-09-18 MED ORDER — LEVOFLOXACIN 500 MG PO TABS
500.0000 mg | ORAL_TABLET | Freq: Every day | ORAL | Status: DC
  Filled 2019-09-18: qty 1

## 2019-09-18 NOTE — Progress Notes (Signed)
   09/18/19 2231  Charting Type  Charting Type Reassessment  Orders Chart Check (once per shift) Completed  Respiratory  Respiratory (WDL) X  Cough None  Sputum Amount None  Sputum Consistency None  Respiratory Pattern Regular  Chest Assessment Chest expansion symmetrical  Bilateral Breath Sounds Diminished  R Upper  Breath Sounds Diminished  L Upper Breath Sounds Diminished  R Lower Breath Sounds Diminished  L Lower Breath Sounds Diminished  Cardiac  Cardiac (WDL) X  Pulse Irregular  Heart Sounds S1, S2  Jugular Venous Distention (JVD) No  ECG Monitor No  Vascular  Vascular (WDL) X  Cyanosis None  Capillary Refill Less than 3 seconds  Edema Generalized  Generalized Edema Other (Comment)  RUE Edema  (non pitting )  RUE Neurovascular Assessment  R Radial Pulse +2  LUE Neurovascular Assessment  L Radial Pulse +2  RLE Neurovascular Assessment  R Dorsalis Pedis Pulse +2  LLE Neurovascular Assessment  L Dorsalis Pedis Pulse +2  Neurological  Level of Consciousness Alert

## 2019-09-18 NOTE — Progress Notes (Signed)
Pt temperature 101.3 and pulse 101 at 2018. This nurse contacted PA Algis Liming and was advised to administer prn tylenol and ice packs. Pt was alert at this time and complained of being warm. Will continue to monitor.

## 2019-09-18 NOTE — Progress Notes (Signed)
Occupational Therapy Session Note  Patient Details  Name: Timothy Le MRN: [id number] Date of Birth: 01/09/1961  Today's Date: 09/18/2019 OT Individual Time: 0830-0930 OT Individual Time Calculation (min): 60 min   Session 2: OT Individual Time: 1430-1455 OT Individual Time Calculation (min): 25 min  20 min missed 2/2 fatigue    Short Term Goals: Week 1:  OT Short Term Goal 1 (Week 1): STG=LTG d/t ELOS  Skilled Therapeutic Interventions/Progress Updates:    Pt received supine with c/o pain in B feet, requesting muscle rub be applied- RN entering room to do so. Pt agreeable to get OOB for ADLs at the sink. Pt completed bed mobility with (S), heavy use of momentum. Pt completed sit > stand from EOB with close (S), using RW for ambulatory transfer to the sink with (S). Pt on 5L ,28% FiO2 at rest, able to be on RA with PMSV and SPO2 remained >95% with sink level ADLs. Pt intermittently requiring rest breaks and min cueing required to do so. UB ADLs completed with set up assist. Pt declined LB ADL's. Pt propelled w/c 100 ft- good improvement from yesterday with (S). Pt completed functional activity with focus on reducing UE reliance when standing and functional activity tolerance. Pt unable to remove BUE from RW without posterior LOB- requiring activity to be graded for alternating UE reliance on his RW. Pt completed 2 trials and required a seated rest break following each. No desaturation this session on RA with PMSV. Pt was returned to his room and with encouragement he remained sitting up in the w/c. All needs met.   Session 2:  Pt received finishing up toileting tasks with NT. Pt reported he had urinary urgency and was unable to make it to the bathroom in time. Pt completed ambulatory transfer in room to EOB, he began panicking at end of transfer and attempted to move RW out of the way and quickly return to bed. Firm cueing required for pt to continue using walker and slow down. Pt given edu  once EOB re safety and fall risk with quick, unstable movements. Pt returned to supine, c/o HA 8/10, requesting tylenol. RN alerted to pt request. Pt willing to complete brief UE circuit from supine. Pt held a 3lb dowel and completed guided functional reaching with rest break required between 2 sets. Pt left supine with all needs met. Pt on 5L 28% FiO2 trach collar.   Therapy Documentation Precautions:  Precautions Precautions: Fall Precaution Comments: trach collar, PMV Restrictions Weight Bearing Restrictions: No  Therapy/Group: Individual Therapy   H  09/18/2019, 6:55 AM  

## 2019-09-18 NOTE — Progress Notes (Signed)
Contacted by pharmacy that cipro shows intermediate sensitivity to pseudomonas and that Levaquin would likely overcome resistance. Will change to Levaquin X 3 for better coverage.

## 2019-09-18 NOTE — Care Management (Signed)
Inpatient Rehabilitation Center Individual Statement of Services  Patient Name:  Timothy Le  Date:  09/18/2019  Welcome to the [facility].  Our goal is to provide you with an individualized program based on your diagnosis and situation, designed to meet your specific needs.  With this comprehensive rehabilitation program, you will be expected to participate in at least 3 hours of rehabilitation therapies Monday-Friday, with modified therapy programming on the weekends.  Your rehabilitation program will include the following services:  Physical Therapy (PT), Occupational Therapy (OT), Speech Therapy (ST), 24 hour per day rehabilitation nursing, Therapeutic Recreaction (TR), Psychology, Neuropsychology, Care Coordinator, Rehabilitation Medicine, Nutrition Services, Pharmacy Services and Other  Weekly team conferences will be held on Tuesdays to discuss your progress.  Your Inpatient Rehabilitation Care Coordinator will talk with you frequently to get your input and to update you on team discussions.  Team conferences with you and your family in attendance may also be held.  Expected length of stay: 7-10 days    Overall anticipated outcome: Independent with Assistive Device  Depending on your progress and recovery, your program may change. Your Inpatient Rehabilitation Care Coordinator will coordinate services and will keep you informed of any changes. Your Inpatient Rehabilitation Care Coordinator's name and contact numbers are listed  below.  The following services may also be recommended but are not provided by the [facility] will be made to provide these services after discharge if needed.  Arrangements include referral to agencies that provide these services.  Your insurance has been verified to be:   Medicaid  Your primary doctor is:  Juluis Mire  Pertinent information will be shared with your doctor and your insurance company.  Inpatient Rehabilitation Care Coordinator:  Cathleen Corti [phone] or [phone]  Information discussed with and copy given to patient by: Rana Snare, 09/18/2019, 11:03 AM

## 2019-09-18 NOTE — Progress Notes (Signed)
Physical Therapy Session Note  Patient Details  Name: Timothy Le MRN: [id number] Date of Birth: 1960-08-04  Today's Date: 09/18/2019 PT Individual Time: 1000-1045 and 1300-1345 PT Individual Time Calculation (min): 45 min and 45 min  Short Term Goals: Week 1:  PT Short Term Goal 1 (Week 1): STG = LTG due to short ELOS.  Skilled Therapeutic Interventions/Progress Updates:     Session 1: Patient in w/c in the room upon PT arrival. Patient alert and agreeable to PT session. Patient denied pain during session, reported improved knee pain with Voltaren gel. Reported having cold chills at beginning of session, improved with mobility.    Therapeutic Activity: Bed Mobility: Patient performed sit to supine with supervision with use of hospital bed functions. Provided verbal cues for controlled mobility for increased safety. Transfers: Patient performed sit to/from stand x3 and stand pivot x1 with CGA-close supervision. Provided verbal cues for controlled movement with decreased use of momentum for safety, forward weight shift, and controlled descent for safety.  Gait Training:  Patient ambulated 50 feet x3 using RW with CGA-close supervision. Ambulated with decreased gait speed, decreased step length and height, increased B hip and knee flexion in stance, forward trunk lean, and downward head gaze. Provided verbal cues for erect posture and paced breathing for improved breath support and decreased gait speed for improved pacing throughout. Trial 1: Before: HR 60, SPO2 100%, After: HR 103, SPO2 95% Trial 2: Before HR 91, SPO2 98%, After: HR 119, SPO2 98% Trial 3: Before HR 92, SPO2 96%, After: HR 104, SPO2 95%, Recovered to HR 96, SPO2 96%  Wheelchair Mobility:  Patient was transported in the w/c with total A throughout session for energy conservation and time management.  Patient in bed at end of session with breaks locked, beds alarm set, and all needs within reach.   Session 2: Patient  sitting EOB with RN and NT in the room fixing patient's strap on his track collar upon PT arrival. Patient alert and agreeable to PT session. Patient reported 5/10 L knee and B foot pain during session, RN made aware, cleared PT to apply Volteran get to patient's L knee and B feet during session for pain management. PT provided repositioning, rest breaks, and distraction as pain interventions throughout session. Patient very proud of his increased distance with ambulation during morning session, however, reports increased fatigue this afternoon. Stated, "you wore me out!"   Focused session on bed level exercise and d/c planning due to increased fatigue and LE pain. Discussed home set-up, safety ideas for his home, assist available, and equipment/transportation needs. He reports that he has daily intermittent assistance from his niece, who also does the cooking and shopping for the patient, get transportation through [employer], has a 6" STE without rails, has hardwood floors without throw rugs throughout the house, has a bariatric RW, does not want to rely on a w/c at d/c, ambulated house-hold distances most of the time with intermittent short community distances PTA, spends his time performing ADLs and some IADs and watching TV at home, and enjoys daily visits from his niece and her 4 children. Educated patient on benefits of intermittent mobility and increasing mobility throughout the day. Discussed performing seated exercises and standing/gait exercises to break up sedentary activities. Patient agreeable and stated that he had started this with [facility] therapies after his last hospital admission. Patient stated that his main goals were to be able to go to his son's graduation in 2 years, put on his  own shoes, and increase his walking distance with decreased SOB.   Bed Mobility: Patient performed sit to supine with supervision with use of hospital bed functions. Provided verbal cues for controlled mobility for increased  safety.  Therapeutic Exercise: Patient performed the following exercises with verbal and tactile cues for proper technique. -B heel slides 2x10 -B SLR 2x10 with cues for increased control -B glut sets 2x10 with 5 sec hold -B quad sets 2x10 with 5 sec hold -bridging x5 with 2-3 sec hold  Patient began coughing during exercises. Patient with audible congestion without productive cough. RN made aware and came to suction patient's trach. Patient missed 15 min of skilled PT due to nursing care, RN aware. Will attempt to make-up missed time as able.    Patient in bed with RN in the room at end of session with breaks locked, bed alarm set, and all needs within reach.    Therapy Documentation Precautions:  Precautions Precautions: Fall Precaution Comments: trach collar, PMV Restrictions Weight Bearing Restrictions: No General: PT Amount of Missed Time (min): 15 Minutes PT Missed Treatment Reason: Nursing care   Therapy/Group: Individual Therapy  Itza Maniaci L Bryssa Tones PT, DPT  09/18/2019, 2:05 PM

## 2019-09-18 NOTE — Progress Notes (Signed)
Patient ID: Timothy Le, male   DOB: 05/01/1960, 59 y.o.   MRN: [id number]   SW made efforts to see pt, however, it in restroom. SW will follow-up to complete assessment. SW left statement of services form on bed.   [name], MSW, [place] Office: [phone] Cell: [phone] Fax: [phone]

## 2019-09-18 NOTE — Progress Notes (Signed)
Inpatient Rehabilitation  Patient information reviewed and entered into eRehab system by Tuwana Kapaun M. Daisy Lites, M.A., CCC/SLP, PPS Coordinator.  Information including medical coding, functional ability and quality indicators will be reviewed and updated through discharge.    

## 2019-09-18 NOTE — Progress Notes (Signed)
   09/18/19 2018  Vitals  Temp (!) 101.3 F (38.5 C)  Temp Source Oral  BP (!) 105/59  BP Location Left Arm  BP Method Automatic  Patient Position (if appropriate) Lying  Pulse Rate (!) 101  Pulse Rate Source Monitor  Resp 18  Level of Consciousness  Level of Consciousness Alert  MEWS COLOR  MEWS Score Color Yellow  Oxygen Therapy  SpO2 96 %  O2 Device Tracheostomy Collar  Pain Assessment  Pain Scale 0-10  Pain Score 7  Pain Location Foot  Pain Orientation Right;Left  Pain Descriptors / Indicators Numbness;Tingling  Pain Frequency Intermittent  Patients Stated Pain Goal 2  Pain Intervention(s) Medication (See eMAR)  Multiple Pain Sites No  PAINAD (Pain Assessment in Advanced Dementia)  Breathing 0  Negative Vocalization 0  Facial Expression 0  Body Language 0  Consolability 0  PAINAD Score 0  Complaints & Interventions  Neuro symptoms relieved by Rest  PCA/Epidural/Spinal Assessment  Respiratory Pattern Regular  MEWS Score  MEWS Temp 1  MEWS Systolic 0  MEWS Pulse 1  MEWS RR 0  MEWS LOC 0  MEWS Score 2  Provider Notification  Date Provider Notified 09/18/19  Time Provider Notified 2018  Notification Type Call  Notification Reason Change in status  Response Other (Comment) (adminster prn medication)  Date of Provider Response 09/18/19  Time of Provider Response 2018

## 2019-09-18 NOTE — Progress Notes (Signed)
[facility] PHYSICAL MEDICINE & REHABILITATION PROGRESS NOTE   Subjective/Complaints: Didn't c/o of left knee today. Did mention his feet which are tingly and painful. voltaren cream helped with these. Up with OT getting cleaned up this morning.   ROS: Patient denies fever, rash, sore throat, blurred vision, nausea, vomiting, diarrhea, cough,   chest pain, joint or back pain, headache, or mood change.   Objective:   No results found. Recent Labs    09/16/19 0315 09/18/19 0334  WBC 6.0 10.0  HGB 9.7* 10.0*  HCT 31.3* 32.5*  PLT 302 272   Recent Labs    09/16/19 0315 09/18/19 0334  NA 142 139  K 3.0* 3.1*  CL 99 98  CO2 32 32  GLUCOSE 131* 137*  BUN 24* 21*  CREATININE 0.95 1.11  CALCIUM 8.8* 8.7*    Intake/Output Summary (Last 24 hours) at 09/18/2019 1122 Last data filed at 09/18/2019 0729 Gross per 24 hour  Intake 596 ml  Output 1400 ml  Net -804 ml     Physical Exam: Vital Signs Blood pressure 115/65, pulse 99, temperature 97.7 F (36.5 C), resp. rate 20, height 6' (1.829 m), SpO2 97 %. Constitutional: No distress . Vital signs reviewed. obese HEENT: EOMI, oral membranes moist Neck: #5 trach with PMV Cardiovascular: RRR without murmur. No JVD    Respiratory/Chest: CTA Bilaterally without wheezes or rales. Normal effort    GI/Abdomen: BS +, non-tender, non-distended Ext: no clubbing, cyanosis, 1+ LE edema Psych: pleasant and cooperative Extremities: Left knee sl tender Skin: Stage 2 pressure injury to buttocks with open area (not visualized today as up in w/c) Neuro: Pt is cognitively appropriate with normal insight, memory, and awareness. Cranial nerves 2-12 are intact. Fine motor coordination is intact. No tremors.5/5 strength throughout. Decreased LT in feet    Assessment/Plan: 1. Functional deficits secondary to debility which require 3+ hours per day of interdisciplinary therapy in a comprehensive inpatient rehab setting.  Physiatrist is providing  close team supervision and 24 hour management of active medical problems listed below.  Physiatrist and rehab team continue to assess barriers to discharge/monitor patient progress toward functional and medical goals  Care Tool:  Bathing    Body parts bathed by patient: Right arm, Left arm, Chest, Abdomen, Front perineal area, Buttocks, Right upper leg, Left upper leg, Face     Body parts n/a: Right lower leg, Left lower leg   Bathing assist Assist Level: Minimal Assistance - Patient > 75%     Upper Body Dressing/Undressing Upper body dressing   What is the patient wearing?: Hospital gown only    Upper body assist Assist Level: Set up assist    Lower Body Dressing/Undressing Lower body dressing            Lower body assist Assist for lower body dressing: Maximal Assistance - Patient 25 - 49%     Toileting Toileting    Toileting assist Assist for toileting: Independent with assistive device Assistive Device Comment: urinal   Transfers Chair/bed transfer  Transfers assist     Chair/bed transfer assist level: Minimal Assistance - Patient > 75%     Locomotion Ambulation   Ambulation assist   Ambulation activity did not occur: Safety/medical concerns  Assist level: Minimal Assistance - Patient > 75% Assistive device: Walker-rolling Max distance: 15 ft   Walk 10 feet activity   Assist  Walk 10 feet activity did not occur: Safety/medical concerns  Assist level: Minimal Assistance - Patient > 75% Assistive device:  Walker-rolling   Walk 50 feet activity   Assist Walk 50 feet with 2 turns activity did not occur: Safety/medical concerns         Walk 150 feet activity   Assist Walk 150 feet activity did not occur: Safety/medical concerns         Walk 10 feet on uneven surface  activity   Assist Walk 10 feet on uneven surfaces activity did not occur: Safety/medical concerns         Wheelchair     Assist Will patient use  wheelchair at discharge?: No (No long-term goals per PT) Type of Wheelchair: Manual Wheelchair activity did not occur: Safety/medical concerns  Wheelchair assist level: Supervision/Verbal cueing Max wheelchair distance: 30 ft    Wheelchair 50 feet with 2 turns activity    Assist    Wheelchair 50 feet with 2 turns activity did not occur: Safety/medical concerns       Wheelchair 150 feet activity     Assist  Wheelchair 150 feet activity did not occur: Safety/medical concerns       Blood pressure 115/65, pulse 99, temperature 97.7 F (36.5 C), resp. rate 20, height 6' (1.829 m), SpO2 97 %.  Medical Problem List and Plan: 1.  Impaired mobility and ADLs secondary to cardiac and pulmonary debility             -patient may sponge bathe             -ELOS/Goals: 7-8 days modI  Continue CIR 2.  Antithrombotics: -DVT/anticoagulation:  Pharmaceutical: Lovenox             -antiplatelet therapy: N/A 3. Pain Management: Gabapentin 200mg  TID effective for neuropathy. Was on oxycodone 10mg  q6H PRN (using sparingly- decreased to 5mg  q6H PRN). Has PRN tylenol.   8/28: Used one oxycodone last night.   8/29: Left knee effusion: ace bandage, taping, ice TID, voltaren gel.   -seems better 8/30 4. Mood: On Seroquel 100mg  BID and Zoloft 100mg  daily. In positive mood now.  5. Neuropsych: This patient is capable of making decisions on his own behalf. 6. Skin/Wound Care: Routine tracheostomy care. Continue Juven bid with multivitamin.  7. Fluids/Electrolytes/Nutrition:  8/28: Hypokalemic to 3. 94meq x2 today and restart home 30meq daily.    8/30 persistent, increase to bid dosing.  8.. T2DM:  On Lantus 30 with novolog 8-10 units tid with meals. Intake has been good--continue to monitor BS ac/hs. changed levemir to lantus bid. Added CM restrictions to diet.   8/29-30: well controlled 9. COPD: Used MDI with nebs prn wheezing PTA. Has not had to use oxygen for past couple of years. Resumed   MDI.   -Continue hypertonic saline nebs.  10. Chronic CHF: Monitor for signs of overload and check weights daily. Continue lopressor bid with demadex  -need daily weights  -There were no vitals filed for this visit.  11. Dysuria: Foley d/c'ed today--  UA/UCS ordered to rule out infection.  8/30, ucx with 30k pseudomonas   -3 days cipro 12. Anxiety: Managed by Zoloft and Seroquel bid.  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 Discussed with therapists. On room air satting well  -continue local trach are  -PMV      LOS: 3 days A FACE TO FACE EVALUATION WAS PERFORMED  Meredith Staggers 09/18/2019, 11:22 AM

## 2019-09-18 NOTE — Progress Notes (Signed)
09/18/19 2018  Charting Type  Charting Type Shift assessment  Neurological  Neuro (WDL) X  Orientation Level Oriented X4  Cognition Appropriate at baseline;Follows commands  Speech Clear  R Hand Grip Moderate  L Hand Grip Moderate   RUE Motor Response Purposeful movement  RUE Sensation Full sensation  RUE Motor Strength 4  LUE Motor Response Purposeful movement  LUE Sensation Full sensation  LUE Motor Strength 4  RLE Motor Response Purposeful movement  RLE Sensation Tingling;Full sensation  RLE Motor Strength 4  LLE Motor Response Purposeful movement  LLE Sensation Tingling  LLE Motor Strength 4  Neuro Symptoms Fatigue  Neuro symptoms relieved by Rest  NuDESC - Delirium Risk Factor Assessment (Complete for non-ICU patients)  Delirium Risk Factor Assessment Severe illness / infection  NuDESC - Nursing Delirium Screening Scale (Complete for non-ICU patients)  Disorientation 0  Inappropriate Behavior 0  Inappropriate Communications 0  Illusions/hallucinations 0  Psychomotor Retardation 0  NuDESC Total Score 0  NuDESC - Delirium Prevention:  Universal Requirements (Complete for all non-ICU patients with a delirium risk factor)  Universal Precautions Initiated *See Row Information* Yes  HEENT  HEENT (WDL) X  Vision Check No  Teeth Missing (Comment)  Tongue Pink;Moist  Mucous Membrane(s) Moist;Pink  Voice Clear;Trach;Passey Muir speaking valve  Respiratory  Respiratory (WDL) X  Cough None  Sputum Amount None  Respiratory Pattern Regular  Chest Assessment Chest expansion symmetrical  Bilateral Breath Sounds Diminished  R Upper  Breath Sounds Diminished  L Upper Breath Sounds Diminished  R Lower Breath Sounds Diminished  L Lower Breath Sounds Diminished  Tracheostomy 5 mm  Placement Date/Time: 09/15/19 1200   Placed By: (c) Other (Comment)  Size (mm): 5 mm  Status Secured  Site Assessment Clean;Dry  Ties Assessment Clean;Secure  Tracheostomy Equipment at bedside  Yes and checklist posted at head of bed  Cardiac  Cardiac (WDL) X  Pulse Irregular  Heart Sounds S1, S2  Jugular Venous Distention (JVD) No  ECG Monitor No  Antiarrhythmic device  Antiarrhythmic device No  Vascular  Vascular (WDL) X  Cyanosis None  Capillary Refill Less than 3 seconds  Pulses R radial;L radial;R dorsalis pedis;L dorsalis pedis  Edema Generalized  Generalized Edema Other (Comment) (non- pitting)  RUE Neurovascular Assessment  R Radial Pulse +2  LUE Neurovascular Assessment  L Radial Pulse +2  RLE Neurovascular Assessment  R Dorsalis Pedis Pulse +2  LLE Neurovascular Assessment  L Dorsalis Pedis Pulse +2  Integumentary  Integumentary (WDL) X  Skin Color Appropriate for ethnicity  Skin Condition Dry  Skin Integrity Intact  Skin Turgor Non-tenting  Braden Scale (Ages 8 and up)  Sensory Perceptions 4  Moisture 3  Activity 2  Mobility 3  Nutrition 3  Friction and Shear 1  Braden Scale Score 16  Musculoskeletal  Musculoskeletal (WDL) X  Assistive Device Front wheel walker  Generalized Weakness Yes  Weight Bearing Restrictions No  Musculoskeletal Details  RUE Limited movement  LUE Limited movement  RLE Limited movement  LLE Limited movement  Gastrointestinal  Gastrointestinal (WDL) WDL  Abdomen Inspection Soft;Obese  Bowel Sounds Assessment Active  Tenderness Nontender  Last BM Date 09/18/19  Passing Flatus Yes  GI Symptoms None  Anus/Rectum  Anus/Rectum (WDL) WDL  GU Assessment  Genitourinary (WDL) WDL  Genitourinary Symptoms None  Genitalia  Male Genitalia Intact  Psychosocial  Psychosocial (WDL) WDL  Patient Behaviors Cooperative;Calm  Needs Expressed Physical  Pressure Injury 09/02/19 Sacrum Mid;Upper Stage 2 -  Partial thickness loss of dermis presenting as a shallow open injury with a red, pink wound bed without slough.  Date First Assessed/Time First Assessed: 09/02/19 2015   Location: Sacrum  Location Orientation: Mid;Upper   Staging: Stage 2 -  Partial thickness loss of dermis presenting as a shallow open injury with a red, pink wound bed without slough.  Present on...  Dressing Type Foam - Lift dressing to assess site every shift  Dressing Clean;Dry;Intact  Dressing Change Frequency PRN  Treatment Off loading  Neurological  Level of Consciousness Alert

## 2019-09-18 NOTE — IPOC Note (Addendum)
Overall Plan of Care [facility]) Patient Details Name: DMITRY MACOMBER MRN: [id number] DOB: Dec 19, 1960  Admitting Diagnosis: [place] [facility] Problems: Principal Problem:   Debility     Functional Problem List: Nursing Bladder, Bowel, Safety, Edema, Endurance, Skin Integrity  PT Balance, Pain, Endurance, Sensory, Motor  OT Balance, Endurance, Motor, Safety, Skin Integrity  SLP    TR         Basic ADL's: OT Grooming, Bathing, Dressing, Toileting     Advanced  ADL's: OT Simple Meal Preparation, Laundry     Transfers: PT Bed Mobility, Bed to Chair, Car, Manufacturing systems engineer, Metallurgist: PT Ambulation, Data processing manager, Emergency planning/management officer     Additional Impairments: OT    SLP        TR      Anticipated Outcomes Item Anticipated Outcome  Self Feeding MOD I  Swallowing      Basic self-care  MOD I  Toileting  MOD I   Bathroom Transfers MOD I  Bowel/Bladder  to be continent x 2  Transfers  mod I with LRAD  Locomotion  mod I house hold ambulation with LRAD  Communication     Cognition     Pain  less than 2 out of 10 on pain scale  Safety/Judgment  remain fall free while in rehab   Therapy Plan: PT Intensity: Minimum of 1-2 x/day ,45 to 90 minutes PT Frequency: 5 out of 7 days PT Duration Estimated Length of Stay: 1-1.5 weeks OT Intensity: Minimum of 1-2 x/day, 45 to 90 minutes OT Frequency: 5 out of 7 days OT Duration/Estimated Length of Stay: 7-10     Due to the current state of emergency, patients may not be receiving their 3-hours of Medicare-mandated therapy.   Team Interventions: Nursing Interventions Patient/Family Education, Disease Management/Prevention, Skin Care/Wound Management, Discharge Planning, Bladder Management, Pain Management, Bowel Management, Medication Management, Dysphagia/Aspiration Precaution Training  PT interventions Ambulation/gait training, Discharge planning, DME/adaptive equipment instruction, Functional mobility  training, Pain management, Psychosocial support, Therapeutic Activities, UE/LE Strength taining/ROM, Wheelchair propulsion/positioning, UE/LE Coordination activities, Therapeutic Exercise, Stair training, Skin care/wound management, Patient/family education, Neuromuscular re-education, Disease management/prevention, Training and development officer, Community reintegration  OT Interventions Training and development officer, Discharge planning, Pain management, Self Care/advanced ADL retraining, Therapeutic Activities, UE/LE Coordination activities, Visual/perceptual remediation/compensation, Therapeutic Exercise, Skin care/wound managment, Patient/family education, Functional mobility training, Disease mangement/prevention, Academic librarian, Engineer, drilling, Neuromuscular re-education, Psychosocial support, Splinting/orthotics, Wheelchair propulsion/positioning, UE/LE Strength taining/ROM  SLP Interventions    TR Interventions    SW/CM Interventions Discharge Planning, Patient/Family Education, Psychosocial Support   Barriers to Discharge MD  Wound care  Nursing      PT Lack of/limited family support pt lives alone  OT Decreased caregiver support, Lack of/limited family support, New oxygen    SLP      SW       Team Discharge Planning: Destination: PT-Home ,OT- Home , SLP-  Projected Follow-up: PT-Home health PT, OT-  Home health OT, SLP-  Projected Equipment Needs: PT-To be determined, OT- None recommended by OT, SLP-  Equipment Details: PT- , OT-  Patient/family involved in discharge planning: PT- Patient,  OT-Patient, SLP-   MD ELOS: 7-10 days Medical Rehab Prognosis:  Excellent Assessment: The patient has been admitted for CIR therapies with the diagnosis of debility after respiratory failure. The team will be addressing functional mobility, strength, stamina, balance, safety, adaptive techniques and equipment, self-care, bowel and bladder mgt, patient and caregiver  education, trach mgt, wound care, community  reentry. Goals have been set at mod I for mobility and self-care.   Due to the current state of emergency, patients may not be receiving their 3 hours per day of Medicare-mandated therapy.    Meredith Staggers, MD, FAAPMR      See Team Conference Notes for weekly updates to the plan of care

## 2019-09-18 NOTE — Progress Notes (Signed)
   09/18/19 2231  Assess: MEWS Score  Temp 98.7 F (37.1 C)  BP 105/62  Pulse Rate (!) 115  Resp 20  SpO2 97 %  O2 Device Tracheostomy Collar  Assess: MEWS Score  MEWS Temp 0  MEWS Systolic 0  MEWS Pulse 2  MEWS RR 0  MEWS LOC 0  MEWS Score 2  MEWS Score Color Yellow  Assess: if the MEWS score is Yellow or Red  Were vital signs taken at a resting state? Yes  Focused Assessment No change from prior assessment  Early Detection of Sepsis Score *See Row Information* High  MEWS guidelines implemented *See Row Information* Yes  Treat  Pain Scale Faces  Pain Score 3  Pain Location Generalized  Pain Intervention(s) Repositioned  Multiple Pain Sites No  Breathing 0  Negative Vocalization 0  Facial Expression 0  Body Language 0  Consolability 0  PAINAD Score 0  Take Vital Signs  Increase Vital Sign Frequency  Yellow: Q 2hr X 2 then Q 4hr X 2, if remains yellow, continue Q 4hrs  Escalate  MEWS: Escalate Yellow: discuss with charge nurse/RN and consider discussing with provider and RRT  Notify: Charge Nurse/RN  Name of Charge Nurse/RN Notified Jeanie Cooks   Date Charge Nurse/RN Notified 09/18/19  Time Charge Nurse/RN Notified 2237  Document  Patient Outcome Other (Comment) (Monitoring )

## 2019-09-19 ENCOUNTER — Inpatient Hospital Stay (HOSPITAL_COMMUNITY): Payer: Medicaid Other

## 2019-09-19 ENCOUNTER — Encounter (HOSPITAL_COMMUNITY): Payer: Self-pay | Admitting: Physical Medicine & Rehabilitation

## 2019-09-19 ENCOUNTER — Inpatient Hospital Stay (HOSPITAL_COMMUNITY): Payer: Medicaid Other | Admitting: Physical Therapy

## 2019-09-19 LAB — GLUCOSE, CAPILLARY
Glucose-Capillary: 141 mg/dL — ABNORMAL HIGH (ref 70–99)
Glucose-Capillary: 97 mg/dL (ref 70–99)
Glucose-Capillary: 135 mg/dL — ABNORMAL HIGH (ref 70–99)
Glucose-Capillary: 130 mg/dL — ABNORMAL HIGH (ref 70–99)

## 2019-09-19 MED ORDER — SODIUM CHLORIDE 0.9 % IV SOLN
2.0000 g | Freq: Three times a day (TID) | INTRAVENOUS | Status: DC
  Administered 2019-09-19 – 2019-09-26 (×20): 2 g via INTRAVENOUS
  Filled 2019-09-19 (×22): qty 2

## 2019-09-19 MED ORDER — CEFDINIR 300 MG PO CAPS
300.0000 mg | ORAL_CAPSULE | Freq: Two times a day (BID) | ORAL | Status: DC
  Administered 2019-09-19: 300 mg via ORAL
  Filled 2019-09-19 (×2): qty 1

## 2019-09-19 NOTE — Progress Notes (Signed)
Patient Details  Name: Timothy Le MRN: [id number] Date of Birth: 22-Apr-1960  Today's Date: 09/19/2019  Hospital Problems: Principal Problem:   Debility  Past Medical History:  Past Medical History:  Diagnosis Date  . Asthma   . Chronic respiratory failure with hypoxia ([place]) 07/09/2017  . COPD (chronic obstructive pulmonary disease) ([place])   . Diabetes mellitus without complication ([place])   . Difficult intubation   . Hypertension   . Shortness of breath dyspnea   . Sleep apnea    Past Surgical History:  Past Surgical History:  Procedure Laterality Date  . IR GASTROSTOMY TUBE MOD SED  07/05/2017  . THYROIDECTOMY Left 08/30/2019   Procedure: THYROIDECTOMY CERVICAL LIPECTOMY;  Surgeon: Izora Gala, MD;  Location: [place];  Service: ENT;  Laterality: Left;  NEEDS RNFA PLEASE  . TRACHEOSTOMY TUBE PLACEMENT N/A 06/17/2017   Procedure: TRACHEOSTOMY;  Surgeon: Leta Baptist, MD;  Location: [facility];  Service: ENT;  Laterality: N/A;  . TRACHEOSTOMY TUBE PLACEMENT N/A 08/14/2019   Procedure: TRACHEOSTOMY;  Surgeon: Izora Gala, MD;  Location: [facility];  Service: ENT;  Laterality: N/A;  . TRACHEOSTOMY TUBE PLACEMENT N/A 08/30/2019   Procedure: TRACHEOSTOMY  REVISION;  Surgeon: Izora Gala, MD;  Location: [facility];  Service: ENT;  Laterality: N/A;   Social History:  reports that he quit smoking about 6 years ago. His smoking use included cigarettes. He started smoking about 36 years ago. He has a 0.25 pack-year smoking history. He has never used smokeless tobacco. He reports that he does not drink alcohol and does not use drugs.  Family / Support Systems Marital Status: Separated How Long?: legally seperated Patient Roles: Parent Spouse/Significant Other: legally seperated Children: adult son lives in [place] Other Supports: none reported Anticipated Caregiver: sister and niece will provide intermittent support Ability/Limitations of Caregiver: Both work and time is limited Writer: Intermittent Family Dynamics: Pt lives alone. 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 SSDI began in 2016 Legal History/Current Legal Issues: Denies Guardian/Conservator: N/A   Abuse/Neglect Abuse/Neglect Assessment Can Be Completed: Yes Physical Abuse: Denies Verbal Abuse: Denies Sexual Abuse: Denies Exploitation of patient/patient's resources: Denies Self-Neglect: Denies  Emotional Status Pt's affect, behavior and adjustment status: Pt in good spirits at time of visit Recent Psychosocial Issues: Denies Psychiatric History: Admits to depression Substance Abuse History: Denies  Patient / Family Perceptions, Expectations & Goals Pt/Family understanding of illness & functional limitations: Pt has a general understaing of care needs Premorbid pt/family roles/activities: Independent with most needs. Pt niece is his payee; pt would utilize public transportation for appointments ([place]), and walk to grocery store. Anticipated changes in roles/activities/participation: Assistance with IADLs Pt/family expectations/goals: "get [place] here."  [employer]: None Premorbid Home Care/DME Agencies: None Transportation available at discharge: Family Resource referrals recommended: Neuropsychology  Discharge Planning Living Arrangements: Alone Support Systems: Other relatives Type of Residence: Private residence Insurance Resources: Medicaid (specify county) Sports coach) Financial Resources: [employer] Financial Screen Referred: No Living Expenses: Education officer, community Management: Other (Comment) (Niece is his payee) Does the patient have any problems obtaining your medications?: No Home  Management: Pt manages housekeeping; meal prep Patient/Family Preliminary Plans: No changes Care Coordinator Barriers to Discharge: Decreased caregiver support, Lack of/limited family support Care Coordinator  Anticipated Follow Up Needs: HH/OP Expected length of stay: 7-10 days  Clinical Impression SW met with pt to introduce self, explain role, and discuss discharge process. Pt is not a English as a second language teacher. Pt POA is his aunt- [name] Settle. Pt admits he quit cigarettes in 2015 as no longer desired to smoke; occasional beer/6pk per week. DME: Cpap machine (states needs new mask).   Cathey Fredenburg A Cay Kath 09/19/2019, 9:54 AM

## 2019-09-19 NOTE — Progress Notes (Signed)
Occupational Therapy Session Note  Patient Details  Name: Timothy Le MRN: [id number] Date of Birth: Feb 01, 1960  Today's Date: 09/19/2019 OT Individual Time: 0930-1000 OT Individual Time Calculation (min): 30 min   Session 2: OT Individual Time: 1250-1320 OT Individual Time Calculation (min): 30 min    Short Term Goals: Week 1:  OT Short Term Goal 1 (Week 1): STG=LTG d/t ELOS  Skilled Therapeutic Interventions/Progress Updates:  Session 1:   Pt received in w/c with no c/o pain. Pt agreeable to completing ADLs at the sink. Pt completed UB dressing and bathing at the sink with set up assist. Pt completed LB bathing with cueing for safety during sit > stand, attempting to hold onto the faucet. Pt required min A to reach posteriorly thoroughly while washing. Pt completed LB dressing with min A 2/2 time constraints. Pt was left sitting up in the w/c with all needs met. Pt on RA and PMSV throughout session with no desaturation but requiring rest breaks throughout session.   Session 2: Pt received supine c/o fatigue, requesting to remain in room for session. Pt completed bed mobility to EOB with (S), heavy use of momentum. Pt completed BUE strengthening circuit with level 1 theraband- 2x circuit of tricep extensions, lat pull downs and B rows, all performed to increase stability during sit <> stands with UE support. Rest breaks provided between each set.  Pt completed 2 sets of standing level functional reaching with single UE support on the RW. Pt with increased difficulty 2/2 fatigue and had 2 very rapid return to sitting episodes despite cueing. Pt on RA and PMSV throughout session with no desaturation. Pt returned to supine with (S). He was encouraged to keep [facility] as elevated as possible for lung expansion and clearance of mucus.     Therapy Documentation Precautions:  Precautions Precautions: Fall Precaution Comments: trach collar, PMV Restrictions Weight Bearing Restrictions:  No   Therapy/Group: Individual Therapy  Curtis Sites 09/19/2019, 7:07 AM

## 2019-09-19 NOTE — Progress Notes (Signed)
Physical Therapy Session Note  Patient Details  Name: TAITE SCHOEPPNER MRN: [id number] Date of Birth: 1960/08/09  Today's Date: 09/19/2019 PT Individual Time: 1655-1740 PT Individual Time Calculation (min): 45 min   Short Term Goals: Week 1:  PT Short Term Goal 1 (Week 1): STG = LTG due to short ELOS.  Skilled Therapeutic Interventions/Progress Updates:    Janett Billow, RN reports pt appropriate for therapy despite elevated temperature as MD is aware. Pt received asleep, supine in bed and upon awakening initially not agreeable to therapy session due to reporting fatigue but with minimal encouragement agreeable to bed level exercises to create HEP. Pt wearing PMV throughout session with 5L O2 via trach collar when at bed otherwise on RA with SpO2 >95% and HR elevating up to 124bpm with activity decreasing back down to 98bpm with rest breaks.  Performed the following exercises with mod cuing throughout for proper form/technique and to decrease speed of movement to increase muscle activation - performed 1 set of each exercise (2 sets of bridging as those and SLR were most challenging for patient): Supine Active Straight Leg Raise - 1 x daily - 7 x weekly - 2 sets - 10 reps  Supine Bridge - 1 x daily - 7 x weekly - 2 sets - 10 reps  Supine Heel Slide - 1 x daily - 7 x weekly - 2 sets - 20 reps  Supine Ankle Pumps - 1 x daily - 7 x weekly - 2 sets - 20 reps  Supine Hip Abduction - 1 x daily - 7 x weekly - 2 sets - 15 reps  Supine Hip Adduction Isometric with Ball - 1 x daily - 7 x weekly - 2 sets - 10 reps - 5 seconds hold Hooklying Isometric Clamshell - 1 x daily - 7 x weekly - 2 sets - 10 reps Provided HEP printout in pt's notebook. Pt reports need to use bathroom. Supine>sitting R EOB, HOB partially elevated and using bedrail, with supervision. Sit<>stands using RW with CGA for safety - demos delayed trunk/hip extension to achieve upright. Gait ~73ft x2 to/from bathroom using RW with CGA for safety,  especially over bathroom door threshold. Sit<>stand BSC over toilet<>RW with CGA. Continent of bowels - standing using BUE support on RW while therapist performed total assist peri-care, pt reports feeling too "shaky" on legs to attempt peri-care at this time. Gait training ~86ft x2 to/from room door using RW with CGA for safety training endurance. Pt left seated EOB with needs in reach, meal tray set-up, 5L O2 via trach collar, and HR/Spo2 monitor line intact.   Therapy Documentation Precautions:  Precautions Precautions: Fall Precaution Comments: trach collar, PMV Restrictions Weight Bearing Restrictions: No  Pain:   Denies pain during session.   Therapy/Group: Individual Therapy  Tawana Scale , PT, DPT, CSRS  09/19/2019, 3:47 PM

## 2019-09-19 NOTE — Patient Care Conference (Signed)
Inpatient RehabilitationTeam Conference and Plan of Care Update Date: 09/19/2019   Time: 09:59 AM    Patient Name: Timothy Le      Medical Record Number: [id number]  Date of Birth: Apr 14, 1960 Sex: Male         Room/Bed: 4W09C/4W09C-01 Payor Info: Payor: MEDICAID [place] / Plan: MEDICAID OF [place] / Product Type: *No Product type* /    Admit Date/Time:  09/15/2019  6:28 PM  Primary Diagnosis:  [place] [facility] Problems: Principal Problem:   Debility    Expected Discharge Date: Expected Discharge Date: 09/25/19  Team Members Present: Physician leading conference: Dr. Alger Simons Care Coodinator Present: [name], LCSWA;Jashae Wiggs Creig Hines, RN, BSN, [place] Nurse Present:  Hyacinth Meeker, RN) PT Present: Apolinar Junes, PT OT Present: Laverle Hobby, OT SLP Present: Weston Anna, SLP PPS Coordinator present : Ileana Ladd, Burna Mortimer, SLP     Current Status/Progress Goal Weekly Team Focus  Bowel/Bladder   pt cont of bowel and bladder, lbm 09/19/19  remain cont of bowel and bladder  Assess q shift and prn   Swallow/Nutrition/ Hydration             ADL's   (S) ADLs, sometimes min A for toileting, (S) transfers, poor activity tolerance, tolerating RA with PMSV  mod I ADLs and IADLs  Functional activity tolerance, dynamic standing balance, safety awareness, AE use   Mobility   Supervision bed mobility, CGA transfers and gait with RW 50 ft x3  Mod I overall  Activity tolerance, balance, functional mobility, stregthening, patient/caregiver education   Communication             Safety/Cognition/ Behavioral Observations            Pain   complaints of pain and tingling to bilateral feet. 5/10. gabapentin and voltaren cream ordered and effective  pain less than 3  assess pain qshift and PRN   Skin   Pressure injury to sacrum, foam dressing in place.  Promote proper wound healing. keep free of infection  Assess skin qshift and PRN     Discharge Planning:  Pt to  discharge to home alone with intermittent support from sister and his niece.   Team Discussion: Pain to feet, Febrile this morning, Cipro given for UTI, patient had received 2 doses of Tylenol for fever. Continent B/B. Secretions thicker at trach, trach is #5 XL cuffless. Supervision, poor safety awareness with OT. Mod I goals. Supervision with PT, Mod I goals. Patient on target to meet rehab goals: yes  *See Care Plan and progress notes for long and short-term goals.   Revisions to Treatment Plan:  None at this time.  Teaching Needs: Continue family education  Current Barriers to Discharge: Decreased caregiver support, [name], Weight and diabetes.  Possible Resolutions to Barriers: Educate family on trach management and care, educate patient and family on diabetes management.     Medical Summary Current Status: debility after cardiac/pulmonary complications. uti, current temp  Barriers to Discharge: Medical stability   Possible Resolutions to Barriers/Weekly Focus: trach mgt, rx of UTI. diabetes mgt, weight control.   Continued Need for Acute Rehabilitation Level of Care: The patient requires daily medical management by a physician with specialized training in physical medicine and rehabilitation for the following reasons: Direction of a multidisciplinary physical rehabilitation program to maximize functional independence : Yes Medical management of patient stability for increased activity during participation in an intensive rehabilitation regime.: Yes Analysis of laboratory values and/or radiology reports with any subsequent need  for medication adjustment and/or medical intervention. : Yes   I attest that I was present, lead the team conference, and concur with the assessment and plan of the team.   Cristi Loron 09/19/2019, 4:03 PM

## 2019-09-19 NOTE — Progress Notes (Signed)
Physical Therapy Session Note  Patient Details  Name: Timothy Le MRN: [id number] Date of Birth: [birth year]  Today's Date: 09/19/2019 PT Individual Time: 1135-1203 PT Individual Time Calculation (min): 28 min   Short Term Goals: Week 1:  PT Short Term Goal 1 (Week 1): STG = LTG due to short ELOS.  Skilled Therapeutic Interventions/Progress Updates:  Session 1: Patient in bed with trach collar donned on 5L/min upon PT arrival. Patient alert and agreeable to PT session. Patient denied pain during session. Reported having a fever last night with bouts of sweating and shivering, however, is asymptomatic this morning and temp 98.5. Removed trach collar and patient remained on RA with PMSV in place throughout session. SPO2 >95%.  Therapeutic Activity: Bed Mobility: Patient performed supine to sit with supervision with HOB elevated without use of bed rail to simulate home set-up. Provided verbal cues for decreased use of bed rails with bed mobility during the day. Transfers: Patient performed sit to/from stand x4 with supervision using RW with improved control with mobility today. Provided verbal cues for forward weight shift and controlled descent for safety.  Gait Training:  Patient ambulated 60 feet x2 using RW with supervision for safety. Ambulated with step-through gait pattern with forward flexed posture, focused on pacing, controlled movements, paced breathing, and erect posture for improved breath support with cues provided throughout. SPO2 95-98% and HR 114-115 after each trial, recovered to 98-99 bpm <2 min with cues for pursed lip breathing. Patient ascended/descended 1-6" step x3 using B rails with increased UE support with CGA. Performed step-to gait pattern leading with R while ascending and L while descending due to hx of L knee pain with mobility. Provided cues for technique and sequencing. SPO2 95%, HR 110 bpm after, recovered to 96 bpm <2 min.  Therapeutic Exercise: Patient  performed the following exercises with verbal and tactile cues for proper technique. Access Code: [email or web address] Seated Heel Toe Raises - 1-2 x daily - 7 x weekly - 2 sets - 20 reps  Seated March - 1-2 x daily - 7 x weekly - 2 sets - 20 reps  Seated Long Arc Quad - 1-2 x daily - 7 x weekly - 2 sets - 15 reps - 2-3 sec hold  Seated Hip Abduction with Pelvic Floor Contraction and Resistance Loop - 1-2 x daily - 7 x weekly - 2 sets - 20 reps  Seated Isometric Hip Adduction with Pelvic Floor Contraction - 1-2 x daily - 7 x weekly - 2 sets - 10 reps - 5 sec hold  Patient in w/c in the room, trach collar placed on 5L/min, at end of session with breaks locked and all needs within reach.   Session 2: Patient in w/c with trach collar donned on 5L/min upon PT arrival. Patient alert and agreeable to PT session. Patient reported some sacral discomfort from sitting in the w/c during session, RN made aware. PT provided repositioning, rest breaks, and distraction as pain interventions throughout session. Removed trach collar and patient remained on RA with PMSV in place throughout session. SPO2 >95%.   Therapeutic Activity: Bed Mobility: Patient performed sit to supine with supervision with mod use of bed rail. Provided verbal cues for reducing use of bed rail. Transfers: Patient performed sit to/from stand x2 and toilet transfer x1 using RW and BSC over toilet with CGA for safety/steadying assist due to decreased control due to fatigue this session. Provided verbal cues as above. Patient was unsuccessful with having a BM on [facility]  during session, required total A for peri-care and LB clothing management due to increased fatigue and decreased balance in standing.   Gait Training:  Patient ambulated 60 feet using RW with CGA for safety/balance. Ambulated as above with some decreased control with stepping and increased work of breathing this trial. SPO2 95% HR 114 bpm after, recovered to 99 bpm <2 min with cues for pursed  lip breathing.  Patient in bed, trach collar placed on 5L/min, at end of session with breaks locked, bed alarm set, and all needs within reach.    Therapy Documentation Precautions:  Precautions Precautions: Fall Precaution Comments: trach collar, PMV Restrictions Weight Bearing Restrictions: No   Therapy/Group: Individual Therapy  Jimmy Plessinger L Dineen Conradt PT, DPT  09/19/2019, 12:21 PM

## 2019-09-19 NOTE — Progress Notes (Addendum)
Pharmacy Antibiotic Note  Timothy Le is a 59 y.o. male admitted on 09/15/2019 .   Dysuria: Foley discontinued 8/30.  Fever, Tc 100.4,  TM 101.3.   WBC  10K.  Pharmacy has been consulted for  Ceftazidime dosing for pseudomonas UTI.    8/28 Urine Cx: 50 K  pseudomonas >  Intermediate sensitivity to cipro, sens to ceftazidime, imipenem, gentamicin   Plan: Ceftazidime 2 g IV q8 hours Monitor clinical status, renal function and culture results daily.     Height: 6' (182.9 cm) IBW/kg (Calculated) : 77.6  Temp (24hrs), Avg:99.6 F (37.6 C), Min:98.5 F (36.9 C), Max:101.3 F (38.5 C)  Recent Labs  Lab 09/16/19 0315 09/18/19 0334  WBC 6.0 10.0  CREATININE 0.95 1.11    Estimated Creatinine Clearance: 105.4 mL/min (by C-G formula based on SCr of 1.11 mg/dL).    No Known Allergies    Thank you for allowing pharmacy to be a part of this patient's care. Nicole Cella, RPh Clinical Pharmacist  Please check AMION for all [place] phone numbers After 10:00 PM, call [place] [phone] 09/19/2019 4:32 PM

## 2019-09-19 NOTE — Progress Notes (Signed)
[place] PHYSICAL MEDICINE & REHABILITATION PROGRESS NOTE   Subjective/Complaints: Pt with noted fever. Didn't c/o chills or any symptoms to me when I saw him this morning. Did c/o of feet, toe nails bothering him. Asked if they could be cut  ROS: Patient denies fever, rash, sore throat, blurred vision, nausea, vomiting, diarrhea, cough, shortness of breath or chest pain,   headache, or mood change.   Objective:   No results found. Recent Labs    09/18/19 0334  WBC 10.0  HGB 10.0*  HCT 32.5*  PLT 272   Recent Labs    09/18/19 0334  NA 139  K 3.1*  CL 98  CO2 32  GLUCOSE 137*  BUN 21*  CREATININE 1.11  CALCIUM 8.7*    Intake/Output Summary (Last 24 hours) at 09/19/2019 1210 Last data filed at 09/19/2019 0726 Gross per 24 hour  Intake 614 ml  Output 500 ml  Net 114 ml     Physical Exam: Vital Signs Blood pressure 116/69, pulse 90, temperature 100.1 F (37.8 C), temperature source Oral, resp. rate 18, height 6' (1.829 m), SpO2 99 %. Constitutional: No distress . Vital signs reviewed. HEENT: EOMI, oral membranes moist Neck: #5 xl trach. Some drainage at base.  Cardiovascular: RRR without murmur. No JVD    Respiratory/Chest: CTA Bilaterally without wheezes or rales. good effort    GI/Abdomen: BS +, non-tender, non-distended Ext: no clubbing, cyanosis, or edema Psych: pleasant and cooperative Extremities: Left knee sl tender Skin: Stage 2 pressure injury to buttocks with open area.  Nails long, fungal changes. Neuro: Pt is cognitively appropriate with normal insight, memory, and awareness. Cranial nerves 2-12 are intact. Fine motor coordination is intact. No tremors.5/5 strength throughout. Decreased LT in feet is ongoing    Assessment/Plan: 1. Functional deficits secondary to debility which require 3+ hours per day of interdisciplinary therapy in a comprehensive inpatient rehab setting.  Physiatrist is providing close team supervision and 24 hour management of  active medical problems listed below.  Physiatrist and rehab team continue to assess barriers to discharge/monitor patient progress toward functional and medical goals  Care Tool:  Bathing    Body parts bathed by patient: Right arm, Left arm, Chest, Abdomen, Front perineal area, Buttocks, Right upper leg, Left upper leg, Face     Body parts n/a: Right lower leg, Left lower leg   Bathing assist Assist Level: Minimal Assistance - Patient > 75%     Upper Body Dressing/Undressing Upper body dressing   What is the patient wearing?: Hospital gown only    Upper body assist Assist Level: Set up assist    Lower Body Dressing/Undressing Lower body dressing            Lower body assist Assist for lower body dressing: Maximal Assistance - Patient 25 - 49%     Toileting Toileting    Toileting assist Assist for toileting: Independent with assistive device Assistive Device Comment: urinal   Transfers Chair/bed transfer  Transfers assist     Chair/bed transfer assist level: Minimal Assistance - Patient > 75%     Locomotion Ambulation   Ambulation assist   Ambulation activity did not occur: Safety/medical concerns  Assist level: Minimal Assistance - Patient > 75% Assistive device: Walker-rolling Max distance: 15 ft   Walk 10 feet activity   Assist  Walk 10 feet activity did not occur: Safety/medical concerns  Assist level: Minimal Assistance - Patient > 75% Assistive device: Walker-rolling   Walk 50 feet activity  Assist Walk 50 feet with 2 turns activity did not occur: Safety/medical concerns         Walk 150 feet activity   Assist Walk 150 feet activity did not occur: Safety/medical concerns         Walk 10 feet on uneven surface  activity   Assist Walk 10 feet on uneven surfaces activity did not occur: Safety/medical concerns         Wheelchair     Assist Will patient use wheelchair at discharge?: No (No long-term goals per  PT) Type of Wheelchair: Manual Wheelchair activity did not occur: Safety/medical concerns  Wheelchair assist level: Supervision/Verbal cueing Max wheelchair distance: 30 ft    Wheelchair 50 feet with 2 turns activity    Assist    Wheelchair 50 feet with 2 turns activity did not occur: Safety/medical concerns       Wheelchair 150 feet activity     Assist  Wheelchair 150 feet activity did not occur: Safety/medical concerns       Blood pressure 116/69, pulse 90, temperature 100.1 F (37.8 C), temperature source Oral, resp. rate 18, height 6' (1.829 m), SpO2 99 %.  Medical Problem List and Plan: 1.  Impaired mobility and ADLs secondary to cardiac and pulmonary debility             -patient may sponge bathe             -ELOS/Goals: 09/25/19,  modI  Continue CIR 2.  Antithrombotics: -DVT/anticoagulation:  Pharmaceutical: Lovenox             -antiplatelet therapy: N/A 3. Pain Management: Gabapentin 200mg  TID effective for neuropathy. Was on oxycodone 10mg  q6H PRN (using sparingly- decreased to 5mg  q6H PRN). Has PRN tylenol.   8/28: Used one oxycodone last night.   8/29: Left knee effusion: ace bandage, taping, ice TID, voltaren gel.   -seems improved 4. Mood: On Seroquel 100mg  BID and Zoloft 100mg  daily. In positive mood now.  5. Neuropsych: This patient is capable of making decisions on his own behalf. 6. Skin/Wound Care: Routine tracheostomy care. Continue Juven bid with multivitamin.   -will need to have toes cut after discharge. Can lightly file them if needed here 7. Fluids/Electrolytes/Nutrition:  8/28: Hypokalemic to 3. 63meq x2 today and restart home 33meq daily.     8/30 persistent, increase to bid dosing.  8.. T2DM:  On Lantus 30 with novolog 8-10 units tid with meals. Intake has been good--continue to monitor BS ac/hs. changed levemir to lantus bid. Added CM restrictions to diet.   8/31: reasonaby controlled 9. COPD: Used MDI with nebs prn wheezing PTA. Has not  had to use oxygen for past couple of years. Resumed  MDI.   -Continue hypertonic saline nebs.  10. Chronic CHF: Monitor for signs of overload and check weights daily. Continue lopressor bid with demadex  -requesting daily weights again  -There were no vitals filed for this visit.  11. Dysuria: Foley d/c'ed today--  UA/UCS ordered to rule out infection.  8/30, ucx with 30k pseudomonas   -3 days cipro 12. Anxiety: Managed by Zoloft and Seroquel bid.  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 Discussed with therapists. On room air satting well  -continue local trach are  -PMV 14. Fever:  -likely d/t #11, changed to cefdinir  -consider lungs/tracheobronchitis: check cxr  -IS, OOB, local trach care      LOS: 4 days A FACE TO [place]  Alroy Dust T  Naaman Plummer 09/19/2019, 12:10 PM

## 2019-09-19 NOTE — Progress Notes (Signed)
Patient ID: Timothy Le, male   DOB: Sep 19, 1960, 59 y.o.   MRN: [id number]   SW met with pt in room to provide updates from team conference, and d/c date 9/6. Pt would like SW to call his sister Timothy Le [phone]) to provide updates. Pt states he believes his DME for his CPAP machine is with [employer]. SW to explore. SW discussed with pt how he will d/c to home with trach care needs.  SW spoke with pt sister Timothy Le to inform on above d/c date. Reports that she will be out of town this weekend due to holiday. She also stated that she will speak with her daughter Timothy Le to see if she will be able to pick him up on Monday or Tuesday. She also reported issues with his housing in which he lives in [address] and there was mold found in his apartment. She stated she is going to follow-up with [address] to inquire if this has been resolved.   [name], MSW, [place] Office: [phone] Cell: [phone] Fax: [phone]

## 2019-09-19 NOTE — Progress Notes (Signed)
Attempted to get sputum culture via trach and was unsuccessful. Inner cannula changed and observed dry hard sputum to old inner cannula. Made a second attempt to suction trach for sputum collection and was unsuccessful. Zella Ball NP on call notified. Recommended asking the oncoming shift to collect sputum.

## 2019-09-20 ENCOUNTER — Inpatient Hospital Stay (HOSPITAL_COMMUNITY): Payer: Medicaid Other | Admitting: *Deleted

## 2019-09-20 ENCOUNTER — Inpatient Hospital Stay (HOSPITAL_COMMUNITY): Payer: Medicaid Other

## 2019-09-20 LAB — GLUCOSE, CAPILLARY
Glucose-Capillary: 108 mg/dL — ABNORMAL HIGH (ref 70–99)
Glucose-Capillary: 122 mg/dL — ABNORMAL HIGH (ref 70–99)
Glucose-Capillary: 119 mg/dL — ABNORMAL HIGH (ref 70–99)
Glucose-Capillary: 121 mg/dL — ABNORMAL HIGH (ref 70–99)

## 2019-09-20 LAB — BASIC METABOLIC PANEL
Anion gap: 12 (ref 5–15)
BUN: 20 mg/dL (ref 6–20)
CO2: 27 mmol/L (ref 22–32)
Calcium: 8.2 mg/dL — ABNORMAL LOW (ref 8.9–10.3)
Chloride: 99 mmol/L (ref 98–111)
Creatinine, Ser: 1.08 mg/dL (ref 0.61–1.24)
GFR calc Af Amer: >60 mL/min (ref >60)
GFR calc non Af Amer: >60 mL/min (ref >60)
Glucose, Bld: 111 mg/dL — ABNORMAL HIGH (ref 70–99)
Potassium: 3.4 mmol/L — ABNORMAL LOW (ref 3.5–5.1)
Sodium: 138 mmol/L (ref 135–145)

## 2019-09-20 LAB — CBC
HCT: 29.3 % — ABNORMAL LOW (ref 39.0–52.0)
Hemoglobin: 8.9 g/dL — ABNORMAL LOW (ref 13.0–17.0)
MCH: 27.2 pg (ref 26.0–34.0)
MCHC: 30.4 g/dL (ref 30.0–36.0)
MCV: 89.6 fL (ref 80.0–100.0)
Platelets: 215 10*3/uL (ref 150–400)
RBC: 3.27 MIL/uL — ABNORMAL LOW (ref 4.22–5.81)
RDW: 16.1 % — ABNORMAL HIGH (ref 11.5–15.5)
WBC: 13.6 10*3/uL — ABNORMAL HIGH (ref 4.0–10.5)
nRBC: 0 % (ref 0.0–0.2)

## 2019-09-20 MED ORDER — POLYETHYLENE GLYCOL 3350 17 G PO PACK: 17.0000 g | PACK | Freq: Every day | ORAL | Status: DC

## 2019-09-20 MED ORDER — INSULIN ASPART 100 UNIT/ML ~~LOC~~ SOLN: 0.0000 [IU] | Freq: Every day | SUBCUTANEOUS | Status: DC

## 2019-09-20 MED ORDER — IPRATROPIUM-ALBUTEROL 0.5-2.5 (3) MG/3ML IN SOLN: 3.0000 mL | Freq: Four times a day (QID) | RESPIRATORY_TRACT | Status: DC | PRN

## 2019-09-20 MED ORDER — INSULIN ASPART 100 UNIT/ML ~~LOC~~ SOLN
0.0000 [IU] | Freq: Three times a day (TID) | SUBCUTANEOUS | Status: DC
  Administered 2019-09-23 – 2019-09-25 (×5): 1 [IU] via SUBCUTANEOUS

## 2019-09-20 NOTE — Progress Notes (Signed)
Physical Therapy Session Note  Patient Details  Name: Timothy Le MRN: [id number] Date of Birth: 1960/08/03  Today's Date: 09/20/2019 PT Individual Time: 0800-0900 PT Individual Time Calculation (min): 60 min   Short Term Goals: Week 1:  PT Short Term Goal 1 (Week 1): STG = LTG due to short ELOS.  Skilled Therapeutic Interventions/Progress Updates:     Patient in bathroom with trach collar doffed and PMSV donned on RA upon PT arrival. Patient alert and agreeable to PT session. Patient reported 6/10 R plantar foot pain during session, RN and MD made aware. Applied Voltaren gel to B feet and knees per RN. PT provided repositioning, rest breaks, and distraction as pain interventions throughout session. SPO2 >95% on RA throughout session.  Therapeutic Activity: Transfers: Patient performed sit to/from stand x5 with supervision using bariatric RW. Provided verbal cues for reaching back to sit for controlled descent.  Gait Training:  Patient ambulated 60 feet and 90 feet using bariatric RW with close supervisin for safety. Ambulated with step-through progressing to step-to gait pattern with fatigue, increased hip flexion and forward trunk lean, and decreased gait speed, step height, and step length. Provided verbal cues for paced breathing and erect posture for improved breath support and increased step height for safety. Patient ascended/descended 1-4" step 2x2 using bariatric RW with CGA and ,in A for device management. Performed step-to gait pattern leading with R while assending. Provided cues for technique and sequencing. SPO2 >95% and HR 100-112 after, recovered tp 90's in <1 min.  Wheelchair Mobility:  Patient was transported in the w/c with total A throughout session for energy conservation and time management. Changed w/c cushion to [place] cushion for improved patient comfort and sitting tolerance.   Educated on energy conservation techniques and use of modified 10 point scale RPE for  fatigue management during rest breaks throughout session.   Patient in w/c with trach collar in place on 5L/min O2 at end of session with breaks locked, chair alarm set, and all needs within reach.    Therapy Documentation Precautions:  Precautions Precautions: Fall Precaution Comments: trach collar, PMV Restrictions Weight Bearing Restrictions: No   Therapy/Group: Individual Therapy  [name] Dannisha Eckmann PT, DPT  09/20/2019, 12:17 PM

## 2019-09-20 NOTE — Progress Notes (Signed)
Occupational Therapy Session Note  Patient Details  Name: Timothy Le MRN: [id number] Date of Birth: July 10, 1960  Today's Date: 09/20/2019 OT Individual Time: 1100-1156 OT Individual Time Calculation (min): 56 min    Short Term Goals: Week 1:  OT Short Term Goal 1 (Week 1): STG=LTG d/t ELOS  Skilled Therapeutic Interventions/Progress Updates:    1;1. Pt received in w/c reporting pain in buttocks d/t not being able to get up out of w/c and pt had called for A to get to EOB but no one had come. Discussed with RN. Pt agreeable to bathing and dressing at sit to stand level at sink. Pt completes all UB bathing with set up and increased time for self paced rest breaks. Pt able to doff clothing off of hips/feet in standing with CGA. Pt ambulates into bathroom with RW d/t urgent BM. Pt completes peri care in standing with CGA and ambulates back to w/c at sink with RW as stated above. Pt dons shirt with set up and pants with min A sit to stand. Pt ambulates from w/c>EOB with RW with CGA. Pt sits EOB at end of session with pt seated in bed, exit alarm on and call light in reach.  Therapy Documentation Precautions:  Precautions Precautions: Fall Precaution Comments: trach collar, PMV Restrictions Weight Bearing Restrictions: No General:   Vital Signs: Therapy Vitals Pulse Rate: 82 BP: 127/78 Patient Position (if appropriate): Sitting Oxygen Therapy SpO2: 97 % O2 Device: Tracheostomy Collar O2 Flow Rate (L/min): 5 L/min FiO2 (%): 28 % Pain:   ADL: ADL Grooming: Setup Where Assessed-Grooming: Sitting at sink Upper Body Bathing: Supervision/safety Where Assessed-Upper Body Bathing: Sitting at sink Lower Body Bathing: Minimal assistance Where Assessed-Lower Body Bathing: Sitting at sink Upper Body Dressing: Setup Where Assessed-Upper Body Dressing: Sitting at sink Lower Body Dressing: Moderate assistance Where Assessed-Lower Body Dressing: Sitting at sink, Standing at  sink Toileting: Minimal assistance Where Assessed-Toileting: Bedside Commode Toilet Transfer: Minimal assistance Toilet Transfer Method: Stand pivot Vision   Perception    Praxis   Exercises:   Other Treatments:     Therapy/Group: Individual Therapy  Tonny Branch 09/20/2019, 11:12 AM

## 2019-09-20 NOTE — Progress Notes (Signed)
Patient ID: Timothy Le, male   DOB: 1960-02-22, 59 y.o.   MRN: [id number]  SW met with pt in room to discuss HHA preference. Pt has not HHA preference. SW informed pt on conversation with his sister Timothy Le. SW informed will follow-up once there is more information.   SW left message for several staff at [facility] [phone]). Sw waiting on follow-up. 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 SW left message for Latoya to discuss issues surrounding mold in unit. SW waiting on follow-up.   SW sent HHPT/OT/SLP/SN(trach care edu) referral to [facility]. SW waiting on follow-up.  *[facility] declined as pt considered new trach patient since he has not been home with the trach.   SW sent referral to [name]. SW waiting on follow-up.   Declined HH: [facility], MSW, [place] Office: [phone] Cell: [phone] Fax: [phone]

## 2019-09-20 NOTE — Progress Notes (Addendum)
[place] PHYSICAL MEDICINE & REHABILITATION PROGRESS NOTE   Subjective/Complaints: Up with PT. Still c/o of feet hurting, toe nails. Feels that his fever "broke" last night. Intermittent cough. Nurse got trach aspirate this morning for lab  ROS: Patient denies fever, rash, sore throat, blurred vision, nausea, vomiting, diarrhea,   shortness of breath or chest pain, joint or back pain, headache, or mood change.   Objective:   DG Chest 2 View  Result Date: 09/19/2019 CLINICAL DATA:  Cough with fever. EXAM: CHEST - 2 VIEW COMPARISON:  09/04/2019. FINDINGS: Tracheostomy tube in stable position. PICC line stable position. Interim removal of feeding tube. Stable cardiomegaly. No pulmonary venous congestion. Lung volumes with mild bibasilar atelectasis. Mild bilateral interstitial prominence. Mild pneumonitis cannot be excluded. No pleural effusion or pneumothorax. Costophrenic angles incompletely imaged. No acute bony abnormality. IMPRESSION: 1. Tracheostomy tube in stable position. PICC line stable position. Interim removal of feeding tube. 2.  Stable cardiomegaly.  No pulmonary venous congestion. 3. Low lung volumes with bibasilar atelectasis. Mild bilateral interstitial prominence. Mild pneumonitis cannot be excluded. Electronically Signed   By: Marcello Moores  Register   On: 09/19/2019 12:09   Recent Labs    09/18/19 0334 09/20/19 0158  WBC 10.0 13.6*  HGB 10.0* 8.9*  HCT 32.5* 29.3*  PLT 272 215   Recent Labs    09/18/19 0334 09/20/19 0158  NA 139 138  K 3.1* 3.4*  CL 98 99  CO2 32 27  GLUCOSE 137* 111*  BUN 21* 20  CREATININE 1.11 1.08  CALCIUM 8.7* 8.2*    Intake/Output Summary (Last 24 hours) at 09/20/2019 0903 Last data filed at 09/20/2019 0756 Gross per 24 hour  Intake 1138 ml  Output 1325 ml  Net -187 ml     Physical Exam: Vital Signs Blood pressure 99/66, pulse 89, temperature 99 F (37.2 C), resp. rate 18, height 6' (1.829 m), SpO2 99 %. Constitutional: No distress .  Vital signs reviewed. HEENT: EOMI, oral membranes moist Neck: supple. Trach just cleaned. Mild tan colored drainage. No odor Cardiovascular: RRR without murmur. No JVD    Respiratory/Chest: CTA Bilaterally without wheezes or rales. Normal effort.    GI/Abdomen: BS +, non-tender, non-distended Ext: no clubbing, cyanosis, or edema Psych: pleasant and cooperative Extremities: Left knee sl tender Skin: Stage 2 pressure injury to buttocks (not visualized today)  Nails long, fungal changes ongoing Neuro: Pt is cognitively appropriate with normal insight, memory, and awareness. Cranial nerves 2-12 are intact. Fine motor coordination is intact. No tremors.5/5 strength throughout. Decreased LT in feet is ongoing    Assessment/Plan: 1. Functional deficits secondary to debility which require 3+ hours per day of interdisciplinary therapy in a comprehensive inpatient rehab setting.  Physiatrist is providing close team supervision and 24 hour management of active medical problems listed below.  Physiatrist and rehab team continue to assess barriers to discharge/monitor patient progress toward functional and medical goals  Care Tool:  Bathing    Body parts bathed by patient: Right arm, Left arm, Chest, Abdomen, Front perineal area, Buttocks, Right upper leg, Left upper leg, Face     Body parts n/a: Right lower leg, Left lower leg   Bathing assist Assist Level: Minimal Assistance - Patient > 75%     Upper Body Dressing/Undressing Upper body dressing   What is the patient wearing?: Hospital gown only    Upper body assist Assist Level: Set up assist    Lower Body Dressing/Undressing Lower body dressing  Lower body assist Assist for lower body dressing: Maximal Assistance - Patient 25 - 49%     Toileting Toileting    Toileting assist Assist for toileting: Independent with assistive device Assistive Device Comment: urinal   Transfers Chair/bed transfer  Transfers  assist     Chair/bed transfer assist level: Contact Guard/Touching assist Chair/bed transfer assistive device: Programmer, multimedia   Ambulation assist   Ambulation activity did not occur: Safety/medical concerns  Assist level: Contact Guard/Touching assist Assistive device: Walker-rolling Max distance: 71ft   Walk 10 feet activity   Assist  Walk 10 feet activity did not occur: Safety/medical concerns  Assist level: Contact Guard/Touching assist Assistive device: Walker-rolling   Walk 50 feet activity   Assist Walk 50 feet with 2 turns activity did not occur: Safety/medical concerns  Assist level: Supervision/Verbal cueing Assistive device: Walker-rolling    Walk 150 feet activity   Assist Walk 150 feet activity did not occur: Safety/medical concerns         Walk 10 feet on uneven surface  activity   Assist Walk 10 feet on uneven surfaces activity did not occur: Safety/medical concerns         Wheelchair     Assist Will patient use wheelchair at discharge?: No (No long-term goals per PT) Type of Wheelchair: Manual Wheelchair activity did not occur: Safety/medical concerns  Wheelchair assist level: Supervision/Verbal cueing Max wheelchair distance: 30 ft    Wheelchair 50 feet with 2 turns activity    Assist    Wheelchair 50 feet with 2 turns activity did not occur: Safety/medical concerns       Wheelchair 150 feet activity     Assist  Wheelchair 150 feet activity did not occur: Safety/medical concerns       Blood pressure 99/66, pulse 89, temperature 99 F (37.2 C), resp. rate 18, height 6' (1.829 m), SpO2 99 %.  Medical Problem List and Plan: 1.  Impaired mobility and ADLs secondary to cardiac and pulmonary debility             -patient may sponge bathe             -ELOS/Goals: 09/25/19,  modI  Continue CIR 2.  Antithrombotics: -DVT/anticoagulation:  Pharmaceutical: Lovenox             -antiplatelet therapy:  N/A 3. Pain Management: Gabapentin 200mg  TID effective for neuropathy. Was on oxycodone 10mg  q6H PRN (using sparingly- decreased to 5mg  q6H PRN). Has PRN tylenol.   8/28: Used one oxycodone last night.   8/29: Left knee effusion: ace bandage, taping, ice TID, voltaren gel.   -seems improved 4. Mood: On Seroquel 100mg  BID and Zoloft 100mg  daily. In positive mood now.  5. Neuropsych: This patient is capable of making decisions on his own behalf. 6. Skin/Wound Care: Routine tracheostomy care. Continue Juven bid with multivitamin.   -will need to have toes cut after discharge.   -will try to file toes sl tomorrow 7. Fluids/Electrolytes/Nutrition:  8/28: Hypokalemic to 3. 53meq x2 today and restart home 52meq daily.     8/30 persistent, increase to bid dosing.    9/1 3.4 today, continue current rx 8.. T2DM:  On Lantus 30 with novolog 8-10 units tid with meals. Intake has been good--continue to monitor BS ac/hs. changed levemir to lantus bid. Added CM restrictions to diet.   9/1: reasonaby controlled 9. COPD: Used MDI with nebs prn wheezing PTA. Has not had to use oxygen for past couple of years.  Resumed  MDI.   -Continue hypertonic saline nebs.  10. Chronic CHF: Monitor for signs of overload and check weights daily. Continue lopressor bid with demadex  -requesting daily weights again  -There were no vitals filed for this visit.  11. Dysuria: Foley d/c'ed today--  UA/UCS ordered to rule out infection.  8/30, ucx with 50k pseudomonas   -changed to [place] after discussion with ID yesterday 8/31 12. Anxiety: Managed by Zoloft and Seroquel bid.  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 Discussed with therapists. On room air satting well  -continue local trach are  -PMV 14. Fever:  -likely d/t #11, temp curve dropping but wbc's up today.   -also consider tracheobronchitis:   -cxr without obvious infiltrate, lungs clear on exam  -trach aspirate pending, consider adding  clindamycin  -continue fortaz, appreciate pharmacy input  -IS, OOB, local trach care      LOS: 5 days A FACE TO FACE EVALUATION WAS PERFORMED  Meredith Staggers 09/20/2019, 9:03 AM

## 2019-09-20 NOTE — Progress Notes (Deleted)
[place] PHYSICAL MEDICINE & REHABILITATION PROGRESS NOTE   Subjective/Complaints: Pt with noted fever. Didn't c/o chills or any symptoms to me when I saw him this morning. Did c/o of feet, toe nails bothering him. Asked if they could be cut  ROS: Patient denies fever, rash, sore throat, blurred vision, nausea, vomiting, diarrhea, cough, shortness of breath or chest pain,   headache, or mood change.   Objective:   DG Chest 2 View  Result Date: 09/19/2019 CLINICAL DATA:  Cough with fever. EXAM: CHEST - 2 VIEW COMPARISON:  09/04/2019. FINDINGS: Tracheostomy tube in stable position. PICC line stable position. Interim removal of feeding tube. Stable cardiomegaly. No pulmonary venous congestion. Lung volumes with mild bibasilar atelectasis. Mild bilateral interstitial prominence. Mild pneumonitis cannot be excluded. No pleural effusion or pneumothorax. Costophrenic angles incompletely imaged. No acute bony abnormality. IMPRESSION: 1. Tracheostomy tube in stable position. PICC line stable position. Interim removal of feeding tube. 2.  Stable cardiomegaly.  No pulmonary venous congestion. 3. Low lung volumes with bibasilar atelectasis. Mild bilateral interstitial prominence. Mild pneumonitis cannot be excluded. Electronically Signed   By: Marcello Moores  Register   On: 09/19/2019 12:09   Recent Labs    09/18/19 0334 09/20/19 0158  WBC 10.0 13.6*  HGB 10.0* 8.9*  HCT 32.5* 29.3*  PLT 272 215   Recent Labs    09/18/19 0334 09/20/19 0158  NA 139 138  K 3.1* 3.4*  CL 98 99  CO2 32 27  GLUCOSE 137* 111*  BUN 21* 20  CREATININE 1.11 1.08  CALCIUM 8.7* 8.2*    Intake/Output Summary (Last 24 hours) at 09/20/2019 0808 Last data filed at 09/20/2019 0756 Gross per 24 hour  Intake 1138 ml  Output 1325 ml  Net -187 ml     Physical Exam: Vital Signs Blood pressure 99/66, pulse 89, temperature 99 F (37.2 C), resp. rate 18, height 6' (1.829 m), SpO2 99 %. Constitutional: No distress . Vital signs  reviewed. HEENT: EOMI, oral membranes moist Neck: #5 xl trach. Some drainage at base.  Cardiovascular: RRR without murmur. No JVD    Respiratory/Chest: CTA Bilaterally without wheezes or rales. good effort    GI/Abdomen: BS +, non-tender, non-distended Ext: no clubbing, cyanosis, or edema Psych: pleasant and cooperative Extremities: Left knee sl tender Skin: Stage 2 pressure injury to buttocks with open area.  Nails long, fungal changes. Neuro: Pt is cognitively appropriate with normal insight, memory, and awareness. Cranial nerves 2-12 are intact. Fine motor coordination is intact. No tremors.5/5 strength throughout. Decreased LT in feet is ongoing    Assessment/Plan: 1. Functional deficits secondary to debility which require 3+ hours per day of interdisciplinary therapy in a comprehensive inpatient rehab setting.  Physiatrist is providing close team supervision and 24 hour management of active medical problems listed below.  Physiatrist and rehab team continue to assess barriers to discharge/monitor patient progress toward functional and medical goals  Care Tool:  Bathing    Body parts bathed by patient: Right arm, Left arm, Chest, Abdomen, Front perineal area, Buttocks, Right upper leg, Left upper leg, Face     Body parts n/a: Right lower leg, Left lower leg   Bathing assist Assist Level: Minimal Assistance - Patient > 75%     Upper Body Dressing/Undressing Upper body dressing   What is the patient wearing?: Hospital gown only    Upper body assist Assist Level: Set up assist    Lower Body Dressing/Undressing Lower body dressing  Lower body assist Assist for lower body dressing: Maximal Assistance - Patient 25 - 49%     Toileting Toileting    Toileting assist Assist for toileting: Independent with assistive device Assistive Device Comment: urinal   Transfers Chair/bed transfer  Transfers assist     Chair/bed transfer assist level: Contact  Guard/Touching assist Chair/bed transfer assistive device: Programmer, multimedia   Ambulation assist   Ambulation activity did not occur: Safety/medical concerns  Assist level: Contact Guard/Touching assist Assistive device: Walker-rolling Max distance: 62ft   Walk 10 feet activity   Assist  Walk 10 feet activity did not occur: Safety/medical concerns  Assist level: Contact Guard/Touching assist Assistive device: Walker-rolling   Walk 50 feet activity   Assist Walk 50 feet with 2 turns activity did not occur: Safety/medical concerns  Assist level: Supervision/Verbal cueing Assistive device: Walker-rolling    Walk 150 feet activity   Assist Walk 150 feet activity did not occur: Safety/medical concerns         Walk 10 feet on uneven surface  activity   Assist Walk 10 feet on uneven surfaces activity did not occur: Safety/medical concerns         Wheelchair     Assist Will patient use wheelchair at discharge?: No (No long-term goals per PT) Type of Wheelchair: Manual Wheelchair activity did not occur: Safety/medical concerns  Wheelchair assist level: Supervision/Verbal cueing Max wheelchair distance: 30 ft    Wheelchair 50 feet with 2 turns activity    Assist    Wheelchair 50 feet with 2 turns activity did not occur: Safety/medical concerns       Wheelchair 150 feet activity     Assist  Wheelchair 150 feet activity did not occur: Safety/medical concerns       Blood pressure 99/66, pulse 89, temperature 99 F (37.2 C), resp. rate 18, height 6' (1.829 m), SpO2 99 %.  Medical Problem List and Plan: 1.  Impaired mobility and ADLs secondary to cardiac and pulmonary debility             -patient may sponge bathe             -ELOS/Goals: 09/25/19,  modI  Continue CIR 2.  Antithrombotics: -DVT/anticoagulation:  Pharmaceutical: Lovenox             -antiplatelet therapy: N/A 3. Pain Management: Gabapentin 200mg  TID effective  for neuropathy. Was on oxycodone 10mg  q6H PRN (using sparingly- decreased to 5mg  q6H PRN). Has PRN tylenol.   8/28: Used one oxycodone last night.   8/29: Left knee effusion: ace bandage, taping, ice TID, voltaren gel.   -seems improved 4. Mood: On Seroquel 100mg  BID and Zoloft 100mg  daily. In positive mood now.  5. Neuropsych: This patient is capable of making decisions on his own behalf. 6. Skin/Wound Care: Routine tracheostomy care. Continue Juven bid with multivitamin.   -will need to have toes cut after discharge. Can lightly file them if needed here 7. Fluids/Electrolytes/Nutrition:  8/28: Hypokalemic to 3. 58meq x2 today and restart home 104meq daily.     8/30 persistent, increase to bid dosing.  8.. T2DM:  On Lantus 30 with novolog 8-10 units tid with meals. Intake has been good--continue to monitor BS ac/hs. changed levemir to lantus bid. Added CM restrictions to diet.   8/31: reasonaby controlled 9. COPD: Used MDI with nebs prn wheezing PTA. Has not had to use oxygen for past couple of years. Resumed  MDI.   -Continue hypertonic saline nebs.  10. Chronic CHF: Monitor for signs of overload and check weights daily. Continue lopressor bid with demadex  -requesting daily weights again  -There were no vitals filed for this visit.  11. Dysuria: Foley d/c'ed today--  UA/UCS ordered to rule out infection.  8/30, ucx with 30k pseudomonas   -3 days cipro 12. Anxiety: Managed by Zoloft and Seroquel bid.  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 Discussed with therapists. On room air satting well  -continue local trach are  -PMV 14. Fever:  -likely d/t #11, changed to cefdinir  -consider lungs/tracheobronchitis: check cxr  -IS, OOB, local trach care      LOS: 5 days A FACE TO FACE EVALUATION WAS PERFORMED  Meredith Staggers 09/20/2019, 8:08 AM

## 2019-09-20 NOTE — Progress Notes (Signed)
Occupational Therapy Session Note  Patient Details  Name: Timothy Le MRN: [id number] Date of Birth: 1960/01/29  Today's Date: 09/20/2019 OT Individual Time: 1345-1500 OT Individual Time Calculation (min): 75 min    Short Term Goals: Week 1:  OT Short Term Goal 1 (Week 1): STG=LTG d/t ELOS  Skilled Therapeutic Interventions/Progress Updates:    1:1. Pt received in bed agreeable to OT. Continued pain in buttocks but declines medication. Sup>sitting EOB with bed rails with S. Pt requires increased time at EOB to "wake up" and then ambulates with RW to bathroom with CGA and RW. Pt completes toileting with CGA and transfers into w/c after bathroom trip in same manner as above. Pt requires VC for RW proximity/management during turns. Pt with small BM in toilet. Pt completes 4x1 min reciprocal stepping pattern on kinetron with 40 cm/sec velocity resistance. Pt requires VC for pacing step rate to be able to complete full minute with pursed lip breathing cues during rest breaks. Pt O2 remains >95% on RA. Pt completes 3x30 ball toss with1.5# wrist weights on BUE for strengthening/endruance required for BADLs/mobility. Pt completes 2 min forward, 2 min backwards on sci fit ergometer on level 4 resistance for endruance/activity tolerance training. Exited session with pt seated in bed, exit alarm on and call light inr each   Therapy Documentation Precautions:  Precautions Precautions: Fall Precaution Comments: trach collar, PMV Restrictions Weight Bearing Restrictions: No General:   Vital Signs: Therapy Vitals Pulse Rate: 82 BP: 127/78 Patient Position (if appropriate): Sitting Oxygen Therapy SpO2: 96 % O2 Device: Tracheostomy Collar O2 Flow Rate (L/min): 5 L/min FiO2 (%): 28 % Pain:   ADL: ADL Grooming: Setup Where Assessed-Grooming: Sitting at sink Upper Body Bathing: Supervision/safety Where Assessed-Upper Body Bathing: Sitting at sink Lower Body Bathing: Minimal  assistance Where Assessed-Lower Body Bathing: Sitting at sink Upper Body Dressing: Setup Where Assessed-Upper Body Dressing: Sitting at sink Lower Body Dressing: Moderate assistance Where Assessed-Lower Body Dressing: Sitting at sink, Standing at sink Toileting: Minimal assistance Where Assessed-Toileting: Bedside Commode Toilet Transfer: Minimal assistance Toilet Transfer Method: Stand pivot Vision   Perception    Praxis   Exercises:   Other Treatments:     Therapy/Group: Individual Therapy  Tonny Branch 09/20/2019, 1:46 PM

## 2019-09-21 ENCOUNTER — Inpatient Hospital Stay (HOSPITAL_COMMUNITY): Payer: Medicaid Other

## 2019-09-21 ENCOUNTER — Inpatient Hospital Stay (HOSPITAL_COMMUNITY): Payer: Medicaid Other | Admitting: *Deleted

## 2019-09-21 ENCOUNTER — Inpatient Hospital Stay (HOSPITAL_COMMUNITY): Payer: Medicaid Other | Admitting: Physical Therapy

## 2019-09-21 LAB — CBC
HCT: 27.9 % — ABNORMAL LOW (ref 39.0–52.0)
Hemoglobin: 8.5 g/dL — ABNORMAL LOW (ref 13.0–17.0)
MCH: 26.8 pg (ref 26.0–34.0)
MCHC: 30.5 g/dL (ref 30.0–36.0)
MCV: 88 fL (ref 80.0–100.0)
Platelets: 222 10*3/uL (ref 150–400)
RBC: 3.17 MIL/uL — ABNORMAL LOW (ref 4.22–5.81)
RDW: 16.2 % — ABNORMAL HIGH (ref 11.5–15.5)
WBC: 9.5 10*3/uL (ref 4.0–10.5)
nRBC: 0 % (ref 0.0–0.2)

## 2019-09-21 LAB — GLUCOSE, CAPILLARY
Glucose-Capillary: 104 mg/dL — ABNORMAL HIGH (ref 70–99)
Glucose-Capillary: 89 mg/dL (ref 70–99)
Glucose-Capillary: 120 mg/dL — ABNORMAL HIGH (ref 70–99)
Glucose-Capillary: 122 mg/dL — ABNORMAL HIGH (ref 70–99)

## 2019-09-21 NOTE — Progress Notes (Signed)
This nurse walked in on patient choking from the water reservoir in trach oxygen tubing. Pt was tracheal suctioned by this nurse and patient was sat on side of bed to further cough up water. Pt experienced an episode of emesis during this moment. Charge Santiago Glad arrived and this nurse called RT to assist (which she voiced she would order a new trach collar due to being saturated from water). This nurse informed Dierdre Harness, PA of incident, she voiced to continue to monitor his vitals and status, and to inform the medical team if there are any status changes.

## 2019-09-21 NOTE — Progress Notes (Signed)
Patient ID: [name], male   DOB: Jul 14, 1960, 59 y.o.   MRN: [id number]   SW received updates from [name] declining referral, and encouraged SW to follow up with [facility] [phone]) as they manage trach and vent patients. SW spoke with [name] and was encouraged to speak with [name] with [facility] [phone]). After discussion on pt case, due to the fact pt will not have a live-in caregiver that will be able to provide 24/7 care, they were unable to review referral.   SW sent referral to [name]. SW waiting on follow-up. SW left message for [facility] [phone]) to discuss CAPDA referral. SW waiting on follow-up.  SW faxed PCS referral to [employer] [phone]).   SW spoke with [facility] CM is [name] [phone]) to discuss issues surrounding mold in unit.States family did not make formal complaint to them, and asked if protocol was followed where they contact the apartment complex first. SW informed pt sister stated she had spoken with someone in the office and was waiting for someone to follow-up as she had not had any updates on if the mold issues was resolved; and also the proprty in process of being sold. States she will enter formal complaint to [name] based on information SW shared.   SW called pt sister [name] [phone] discuss above. States her dater [name] went by apartment complex yesterday and was informed the apartment complex was sold last week, and still unsure if this issue was resolved prior to sale. States she will followup with SW this afternoon as she had other questions to ask apartment complex.   SW completed [employer] referral with [name]. Pt home care agencies are: 1) [facility], 2) [facility], and 3) [facility].   *SW submitted CAP/DA referral with [name] who reported application packet will be  mailed out to the home from [place]. Reports it can take 7-10 business days to receive. In order to be considered for services, the application must be mailed back with completed portion by PCP.  HHA referral sent to: [facility]- waiting on follow-up [facility]- waiting on follow-up  HHAs Declined [facility] considers pt new trach [facility] program recommended pt for Private duty aide program. Since pt unable to have live in caregiver not able to consider referral. [facility] *[facility]- no availability  *[facility]- due to staffing *[facility]- due to staffing [name]- do not work with trach patients   [name], MSW, [place] Office: [phone] Cell: [phone] Fax: [phone]

## 2019-09-21 NOTE — Evaluation (Signed)
Recreational Therapy Assessment and Plan  Patient Details  Name: Timothy Le MRN: [id number] Date of Birth: Aug 31, 1960 Today's Date: 09/21/2019  Rehab Potential:   Good ELOS:   d/c 9/6  Assessment Hospital Problem: Principal Problem:   Debility   Past Medical History:      Past Medical History:  Diagnosis Date  . Asthma   . Chronic respiratory failure with hypoxia ([place]) 07/09/2017  . COPD (chronic obstructive pulmonary disease) ([place])   . Diabetes mellitus without complication ([place])   . Difficult intubation   . Hypertension   . Shortness of breath dyspnea   . Sleep apnea    Past Surgical History:       Past Surgical History:  Procedure Laterality Date  . IR GASTROSTOMY TUBE MOD SED  07/05/2017  . THYROIDECTOMY Left 08/30/2019   Procedure: THYROIDECTOMY CERVICAL LIPECTOMY;  Surgeon: Izora Gala, MD;  Location: [facility];  Service: ENT;  Laterality: Left;  NEEDS RNFA PLEASE  . TRACHEOSTOMY TUBE PLACEMENT N/A 06/17/2017   Procedure: TRACHEOSTOMY;  Surgeon: Leta Baptist, MD;  Location: [facility];  Service: ENT;  Laterality: N/A;  . TRACHEOSTOMY TUBE PLACEMENT N/A 08/14/2019   Procedure: TRACHEOSTOMY;  Surgeon: Izora Gala, MD;  Location: [facility];  Service: ENT;  Laterality: N/A;  . TRACHEOSTOMY TUBE PLACEMENT N/A 08/30/2019   Procedure: TRACHEOSTOMY  REVISION;  Surgeon: Izora Gala, MD;  Location: [facility];  Service: ENT;  Laterality: N/A;    Assessment & Plan Clinical Impression: Patient is a 59 y.o. year old male with recent admission to the hospital on 59 year old male with history of T2DM, OSA, morbid obesity-BMI 41.98, COPD, tracheal stenosis and prior tracheostomy who was admitted on 07/30/19 with reports of BIPAP not working for 2 weeks,acute on chronic respiratory failure and was unresponsive requiring intubation. Hospital course significant for electrolyte abnormalities, hyperglycemia as well as inability to tolerate extubation requiring emergent tracheostomy by Dr.  Grandville Silos. ENT consulted for input due to large thyroid goiter and felt that patient would need a thyroidectomy prior to decannulation. Trach did dislodge on 07/24 due to agitation requiring emergent reintubation and he underwent tracheostomy by Dr. Constance Holster on 07/26. He has had issues with recurrent mucous plug and bradycardic arrest treated with ACLS protocol on 08/09.   He underwent thyroidectomy, cervical lipectomy and tracheostomy revision on 08/11. He had significant bleeding postop requiring 2 units PRBC. He was tolerated extubation and was started on regular diet by extubated and started on regular diet by 08/16. Respiratory status stable and chronic diastolic CHF compensated--Cardura and metaxalone on home. Gabapentin added to help manage neuropathic symptoms. Trach downsized to 5 XLT today. Therapy ongoing and CIR recommended due to debility.  Patient transferred to CIR on 09/15/2019 .  Pt presents with decreased activity tolerance, decreased functional mobility, decreased balance Limiting pt's independence with leisure/community pursuits.  Met with pt today to discuss leisure education including activity analysis identifying barriers and potential modifications for participation.  Pt state he needs to be more active and plans to do so as best he can once home and with continued recovery.   Plan  No further TR as pt is expected to discharge 9/6  Recommendations for other services: None   Discharge Criteria: Patient will be discharged from TR if patient refuses treatment 3 consecutive times without medical reason.  If treatment goals not met, if there is a change in medical status, if patient makes no progress towards goals or if patient is discharged from hospital.  The  above assessment, treatment plan, treatment alternatives and goals were discussed and mutually agreed upon: by patient  [place] 09/21/2019, 3:51 PM

## 2019-09-21 NOTE — Progress Notes (Signed)
Physical Therapy Session Note  Patient Details  Name: Timothy Le MRN: [id number] Date of Birth: [birth year]  Today's Date: 09/21/2019 PT Individual Time: 1458-1540 PT Individual Time Calculation (min): 42 min   Short Term Goals: Week 1:  PT Short Term Goal 1 (Week 1): STG = LTG due to short ELOS.  Skilled Therapeutic Interventions/Progress Updates:    Patient received sitting EOB agreeable to PT but endorses fatigue from earlier sessions. He currently reports 7/10 "generalized" but primarily L shoulder. He reports receiving pain rx for this just prior to this session. PT attempting to assess L shoulder, however patient was so sensitive to touch around superior acromion process, PT unable to complete thorough assessment. AROM WFL to L shoulder without report of increased pain. Patient agreeable to transferring to wc, but upon standing up PT noticing that patient was wet and needed to change bottoms. Patient able to doff pants/underwear with verbal cues and complete peri hygiene with step up assist for front, maxA for back. MaxA for donning new underwear and pants. After completing this, patient requesting to be deep suctioned- RT present to do so. Patient transferred to wc via stand pivot with CGA-MinA. Patient appears generally unsteady on his feet maintaining WBOS throughout transfer. Uncontrolled descent into chair noted. PT able to clean off bed from episode of incontinence. Patient transferred back to bed per his request with FWW and CGA. Improved stability noted. Patient able to reposition in bed. HOB elevated >30*, call light within reach.   Therapy Documentation Precautions:  Precautions Precautions: Fall Precaution Comments: trach collar, PMV Restrictions Weight Bearing Restrictions: No    Therapy/Group: Individual Therapy  Karoline Caldwell, PT, DPT, CBIS 09/21/2019, 7:52 AM

## 2019-09-21 NOTE — Progress Notes (Signed)
Physical Therapy Session Note  Patient Details  Name: Timothy Le MRN: [id number] Date of Birth: 12-23-1960  Today's Date: 09/21/2019 PT Individual Time: 1000-1100 and 1405-1430 PT Individual Time Calculation (min): 60 min and 25 min   Short Term Goals: Week 1:  PT Short Term Goal 1 (Week 1): STG = LTG due to short ELOS.  Skilled Therapeutic Interventions/Progress Updates:     Session 1: Patient in w/c in the room upon PT arrival. Patient alert and agreeable to PT session. Patient reported 6/10 L shoulder pain during session, RN made aware. PT provided repositioning, rest breaks, and distraction as pain interventions throughout session.   Therapeutic Activity: Bed Mobility: Patient performed supine to sit with mod I with use of bed rail. Transfers: Patient performed sit to/from stand x6 with supervision using bariatric RW. Provided verbal cues for forward weight shift for controlled stand and controlled descent for safety x2.  Gait Training:  Patient ambulated 70 feet using bariatric RW with close supervision. Ambulated with decreased gait speed, decreased step length and height, decreased lateral hip stability, increased B hip flexion in stance, and forward trunk lean. Provided verbal cues for erect posture and paced breathing for improved breath support, and increased step height for safety.  Wheelchair Mobility:  Patient was transported in the w/c with total A throughout session for energy conservation and time management.  Neuromuscular Re-ed: Patient performed the following standing balance activities: -played wii bowling standing 3 frames, sitting 2 frames, standing 4 frames, and sitting 1 frame, utilized 0-1 UE support for balance throughout, required increased time due to poor recall and coordination of use of controls, required close supervision for balance and min A x1 for LOB in standing  Manual Therapy: Patient with increased L shoulder pain this session. Reports using  UE ergometer yesterday and feeling soreness after. Noted increased swelling of supraspinatus and trigger points with jump sign present in upper traps and infraspinatus. Performed soft tissue mobilization of upper traps, scapular musculature, and pec major, manual stretch of L shoulder ER, ABD, and flexion, and trigger point release to patient tolerance of upper trap and infraspinatus. Applied heat to upper trap after. Encouraged patient to ice over supraspinatus due to inflammation, patient declined due to intermittent cold chills.   Patient in bed at end of session with breaks locked, bed alarm set, and all needs within reach.   Session 2: Patient in bed upon PT arrival. Patient alert and agreeable to PT session. Patient reported 7/10 L shoulder pain during session, RN made aware. PT provided repositioning, rest breaks, and distraction as pain interventions throughout session. Patient reported increased fatigue from busy morning schedule. Patient declined OOB mobility this session.   PT reviewed foot care and risk of infection using teach back method from previous session. Washed patient's feet and applied Voltaren gel to B feet during session. Also applied Voltaren gel to L shoulder, which has not improved in pain since previous session. Performed manual therapy, as above, with mobilization with movement for ABD, ER, and flexion stretching. Patient reported improved pain after. Patient sat EOB to don shirt after with mod I and set-up for dressing. Patient then returned to lying with mod I with use of bed rail.  Patient in bed at end of session with breaks locked, bed alarm set, and all needs within reach.    Therapy Documentation Precautions:  Precautions Precautions: Fall Precaution Comments: trach collar, PMV Restrictions Weight Bearing Restrictions: No   Therapy/Group: Individual Therapy  Yesennia Hirota L  Ary Rudnick PT, DPT  09/21/2019, 4:28 PM

## 2019-09-21 NOTE — Progress Notes (Signed)
Occupational Therapy Session Note  Patient Details  Name: Timothy Le MRN: [id number] Date of Birth: Aug 18, 1960  Today's Date: 09/21/2019 OT Individual Time: 2248-2500 OT Individual Time Calculation (min): 56 min    Short Term Goals: Week 1:  OT Short Term Goal 1 (Week 1): STG=LTG d/t ELOS  Skilled Therapeutic Interventions/Progress Updates:    1:1. Pt received in bed agreeable to OT seated EOB. Pt reporting need to toilet. Pt competes mobility throughout session at ambulatory level with RW and CS overall  With CGA over threshold to bathroom. Pt doffs shirt with MIN A for LOB pulling shirt overhead. Edu re doffing shirt seated d/t occluded vision impacting balance in standing. Pt able to cleanse peri area after toileting. Pt completes bathing at sink sit to stand overall with CS. Pt completes dressing from low recliner with CS to simulate surface of low bed at home to improve I in LB dressing with S for underwear and pants. Pt requesting to "ride the bike." Pt completes 2x3 min NuStep for reciprocal movement training in prep for functional mobility/activity tolernace required for ADLs with cuing to keep steps >65 per min on workload 5 increasing to 6 for second. Hand off to PT for next session with pt seated in w/c. Therapy Documentation Precautions:  Precautions Precautions: Fall Precaution Comments: trach collar, PMV Restrictions Weight Bearing Restrictions: No General:   Vital Signs: Therapy Vitals Temp: 98.5 F (36.9 C) Pulse Rate: 75 Resp: 20 BP: 103/62 Patient Position (if appropriate): Lying Oxygen Therapy SpO2: 96 % O2 Device: Tracheostomy Collar O2 Flow Rate (L/min): 5 L/min FiO2 (%): 28 % Pain:   ADL: ADL Grooming: Setup Where Assessed-Grooming: Sitting at sink Upper Body Bathing: Supervision/safety Where Assessed-Upper Body Bathing: Sitting at sink Lower Body Bathing: Minimal assistance Where Assessed-Lower Body Bathing: Sitting at sink Upper Body  Dressing: Setup Where Assessed-Upper Body Dressing: Sitting at sink Lower Body Dressing: Moderate assistance Where Assessed-Lower Body Dressing: Sitting at sink, Standing at sink Toileting: Minimal assistance Where Assessed-Toileting: Bedside Commode Toilet Transfer: Minimal assistance Toilet Transfer Method: Stand pivot Vision   Perception    Praxis   Exercises:   Other Treatments:     Therapy/Group: Individual Therapy  Tonny Branch 09/21/2019, 7:01 AM

## 2019-09-21 NOTE — Progress Notes (Signed)
Physical Therapy Session Note  Patient Details  Name: Timothy Le MRN: [id number] Date of Birth: [birth year]  Today's Date: 09/21/2019 PT Individual Time: 0800-0828 PT Individual Time Calculation (min): 28 min   Short Term Goals: Week 1:  PT Short Term Goal 1 (Week 1): STG = LTG due to short ELOS.  Skilled Therapeutic Interventions/Progress Updates: Pt presents sitting in w/c, handed off from OT.  Pt agreeable to therapy.  Pt transfers sit to stand multiple trials w/ supervision.  Pt amb multiple trials w/ RW and CGA up to 100' including turns and negotiating through cones.  Pt required seated rest breaks 2/2 fatigue.  Sats monitored on RA at 97% and HR of 114-117 bpm after activity, but improves to 100% and 84 bpm within 30 secs.  Pt returned to room and seat alarm on w/ all needs in reach.     Therapy Documentation Precautions:  Precautions Precautions: Fall Precaution Comments: trach collar, PMV Restrictions Weight Bearing Restrictions: No General:   Vital Signs: Therapy Vitals Temp: 98.5 F (36.9 C) Pulse Rate: 82 Resp: 18 BP: 103/62 Patient Position (if appropriate): Sitting Oxygen Therapy SpO2: 98 % O2 Device: Room Air Pain: pt states toes are sore, toenails need to be clipped.      Therapy/Group: Individual Therapy  [name] 09/21/2019, 8:29 AM

## 2019-09-21 NOTE — Progress Notes (Signed)
[place] PHYSICAL MEDICINE & REHABILITATION PROGRESS NOTE   Subjective/Complaints: Up in chair. Still coughing brown/tan mucous. Denies fever or chills. Tmx 99.   ROS: Patient denies fever, rash, sore throat, blurred vision, nausea, vomiting, diarrhea,   shortness of breath or chest pain, joint or back pain, headache, or mood change.    Objective:   No results found. Recent Labs    09/20/19 0158 09/21/19 0431  WBC 13.6* 9.5  HGB 8.9* 8.5*  HCT 29.3* 27.9*  PLT 215 222   Recent Labs    09/20/19 0158  NA 138  K 3.4*  CL 99  CO2 27  GLUCOSE 111*  BUN 20  CREATININE 1.08  CALCIUM 8.2*    Intake/Output Summary (Last 24 hours) at 09/21/2019 1045 Last data filed at 09/21/2019 0828 Gross per 24 hour  Intake 662 ml  Output 1200 ml  Net -538 ml     Physical Exam: Vital Signs Blood pressure 103/62, pulse 82, temperature 98.5 F (36.9 C), resp. rate 18, height 6' (1.829 m), weight (!) 141.8 kg, SpO2 98 %. Constitutional: No distress . Vital signs reviewed. HEENT: EOMI, oral membranes moist Neck: #5 trach with some discharge around base, tan/brown mucous produced Cardiovascular: RRR without murmur. No JVD    Respiratory/Chest: a few scattered rhonchi and upper airway sounds. Normal effort    GI/Abdomen: BS +, non-tender, non-distended Ext: no clubbing, cyanosis, or edema Psych: pleasant and cooperative Extremities: Left knee sl tender Skin: Stage 2 pressure injury to buttocks (not visualized today)  Nails long, fungal changes in both feet Neuro: Pt is cognitively appropriate with normal insight, memory, and awareness. Cranial nerves 2-12 are intact. Fine motor coordination is intact. No tremors.5/5 strength throughout. Decreased LT in feet remains    Assessment/Plan: 1. Functional deficits secondary to debility which require 3+ hours per day of interdisciplinary therapy in a comprehensive inpatient rehab setting.  Physiatrist is providing close team supervision and 24  hour management of active medical problems listed below.  Physiatrist and rehab team continue to assess barriers to discharge/monitor patient progress toward functional and medical goals  Care Tool:  Bathing    Body parts bathed by patient: Right arm, Left arm, Chest, Abdomen, Front perineal area, Buttocks, Right upper leg, Left upper leg, Face, Left lower leg, Right lower leg     Body parts n/a: Right lower leg, Left lower leg   Bathing assist Assist Level: Contact Guard/Touching assist     Upper Body Dressing/Undressing Upper body dressing   What is the patient wearing?: Pull over shirt    Upper body assist Assist Level: Set up assist    Lower Body Dressing/Undressing Lower body dressing      What is the patient wearing?: Underwear/pull up, Pants     Lower body assist Assist for lower body dressing: Supervision/Verbal cueing     Toileting Toileting    Toileting assist Assist for toileting: Contact Guard/Touching assist Assistive Device Comment: urinal   Transfers Chair/bed transfer  Transfers assist     Chair/bed transfer assist level: Supervision/Verbal cueing Chair/bed transfer assistive device: Programmer, multimedia   Ambulation assist   Ambulation activity did not occur: Safety/medical concerns  Assist level: Supervision/Verbal cueing Assistive device: Walker-rolling Max distance: 100   Walk 10 feet activity   Assist  Walk 10 feet activity did not occur: Safety/medical concerns  Assist level: Supervision/Verbal cueing Assistive device: Walker-rolling   Walk 50 feet activity   Assist Walk 50 feet with 2 turns  activity did not occur: Safety/medical concerns  Assist level: Supervision/Verbal cueing Assistive device: Walker-rolling    Walk 150 feet activity   Assist Walk 150 feet activity did not occur: Safety/medical concerns         Walk 10 feet on uneven surface  activity   Assist Walk 10 feet on uneven surfaces  activity did not occur: Safety/medical concerns         Wheelchair     Assist Will patient use wheelchair at discharge?: No (No long-term goals per PT) Type of Wheelchair: Manual Wheelchair activity did not occur: Safety/medical concerns  Wheelchair assist level: Supervision/Verbal cueing Max wheelchair distance: 30 ft    Wheelchair 50 feet with 2 turns activity    Assist    Wheelchair 50 feet with 2 turns activity did not occur: Safety/medical concerns       Wheelchair 150 feet activity     Assist  Wheelchair 150 feet activity did not occur: Safety/medical concerns       Blood pressure 103/62, pulse 82, temperature 98.5 F (36.9 C), resp. rate 18, height 6' (1.829 m), weight (!) 141.8 kg, SpO2 98 %.  Medical Problem List and Plan: 1.  Impaired mobility and ADLs secondary to cardiac and pulmonary debility             -patient may sponge bathe             -ELOS/Goals: 09/25/19,  Mod I  Continue CIR 2.  Antithrombotics: -DVT/anticoagulation:  Pharmaceutical: Lovenox             -antiplatelet therapy: N/A 3. Pain Management: Gabapentin 200mg  TID effective for neuropathy. Was on oxycodone 10mg  q6H PRN (using sparingly- decreased to 5mg  q6H PRN). Has PRN tylenol.         Left knee effusion: ace bandage, taping, ice TID, voltaren gel.   -seems improved  -still c/o feet/toes bothering him 4. Mood: On Seroquel 100mg  BID and Zoloft 100mg  daily.   -mood very positive  5. Neuropsych: This patient is capable of making decisions on his own behalf. 6. Skin/Wound Care: Routine tracheostomy care. Continue Juven bid with multivitamin.   -will need to have toes cut after discharge.   -will try to file toes on Friday--is up with therapy all morning today 7. Fluids/Electrolytes/Nutrition:  8/28: Hypokalemic to 3. 34meq x2 today and restart home 37meq daily.     8/30 persistent, increase to bid dosing.    9/1 3.4   continue current rx 8.. T2DM:  On Lantus 30 with novolog  8-10 units tid with meals. Intake has been good--continue to monitor BS ac/hs. changed levemir to lantus bid. Added CM restrictions to diet.   9/2: reasonaby controlled 9. COPD: Used MDI with nebs prn wheezing PTA. Has not had to use oxygen for past couple of years. Resumed  MDI.   -Continue hypertonic saline nebs.  10. Chronic CHF: Monitor for signs of overload and check weights daily. Continue lopressor bid with demadex    Filed Weights   09/20/19 1321  Weight: (!) 141.8 kg    11. Dysuria:    8/30, ucx with 50k pseudomonas   -changed to [place] after discussion with ID  8/31 12. Anxiety: Managed by Zoloft and Seroquel bid.  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 Discussed with therapists. On room air satting well  -continue local trach are  -PMV 14. Fever: temps falling, wbc's down to 9.5 today 9/2  -likely d/t #11    -also consider  tracheobronchitis   -cxr without obvious infiltrate, lungs clear on exam  -trach aspirate with wbc's, no organisms on GS.  culture pending  -continue fortaz, appreciate pharmacy input  -IS, OOB, local trach care. Encouraged pt to cough up secretions  -mucinex, humidified air     LOS: 6 days A FACE TO FACE EVALUATION WAS PERFORMED  Meredith Staggers 09/21/2019, 10:45 AM

## 2019-09-22 ENCOUNTER — Inpatient Hospital Stay (HOSPITAL_COMMUNITY): Payer: Medicaid Other

## 2019-09-22 ENCOUNTER — Inpatient Hospital Stay (HOSPITAL_COMMUNITY): Payer: Medicaid Other | Admitting: Physical Therapy

## 2019-09-22 LAB — CBC WITH DIFFERENTIAL/PLATELET
Abs Immature Granulocytes: 0.21 10*3/uL — ABNORMAL HIGH (ref 0.00–0.07)
Basophils Absolute: 0.1 10*3/uL (ref 0.0–0.1)
Basophils Relative: 1 %
Eosinophils Absolute: 0.2 10*3/uL (ref 0.0–0.5)
Eosinophils Relative: 2 %
HCT: 29.4 % — ABNORMAL LOW (ref 39.0–52.0)
Hemoglobin: 8.9 g/dL — ABNORMAL LOW (ref 13.0–17.0)
Immature Granulocytes: 3 %
Lymphocytes Relative: 22 %
Lymphs Abs: 1.9 10*3/uL (ref 0.7–4.0)
MCH: 27 pg (ref 26.0–34.0)
MCHC: 30.3 g/dL (ref 30.0–36.0)
MCV: 89.1 fL (ref 80.0–100.0)
Monocytes Absolute: 0.8 10*3/uL (ref 0.1–1.0)
Monocytes Relative: 9 %
Neutro Abs: 5.5 10*3/uL (ref 1.7–7.7)
Neutrophils Relative %: 63 %
Platelets: 256 10*3/uL (ref 150–400)
RBC: 3.3 MIL/uL — ABNORMAL LOW (ref 4.22–5.81)
RDW: 16.6 % — ABNORMAL HIGH (ref 11.5–15.5)
WBC: 8.6 10*3/uL (ref 4.0–10.5)
nRBC: 0 % (ref 0.0–0.2)

## 2019-09-22 LAB — GLUCOSE, CAPILLARY
Glucose-Capillary: 100 mg/dL — ABNORMAL HIGH (ref 70–99)
Glucose-Capillary: 118 mg/dL — ABNORMAL HIGH (ref 70–99)
Glucose-Capillary: 128 mg/dL — ABNORMAL HIGH (ref 70–99)
Glucose-Capillary: 139 mg/dL — ABNORMAL HIGH (ref 70–99)

## 2019-09-22 NOTE — Progress Notes (Signed)
Physical Therapy Session Note  Patient Details  Name: Timothy Le MRN: [id number] Date of Birth: 01/20/1960  Today's Date: 09/22/2019 PT Individual Time: 8102-5486 PT Individual Time Calculation (min): 23 min   Short Term Goals: Week 1:  PT Short Term Goal 1 (Week 1): STG = LTG due to short ELOS.  Skilled Therapeutic Interventions/Progress Updates:    Pt received supine in bed. Pt reporting severe 8/10 pain in the R foot on the lateral/dorsal aspect of foot with mild swelling noted.  PT applied pain cream to inflamed area per pt request. Pt performed SLR x 5 BLE to assess BLE as well as doff and don socks throughout session. Scooting to [facility] after several attempts due to increased pain in the R foot with use. Pt perform urination with urinal while supine in bed with out assist. Pt left supine in bed with call bell in reach and all needs met.     Therapy Documentation Precautions:  Precautions Precautions: Fall Precaution Comments: trach collar, PMV Restrictions Weight Bearing Restrictions: No    Vital Signs: Therapy Vitals Temp: 99.3 F (37.4 C) Pulse Rate: 74 Resp: 16 BP: 109/60 Patient Position (if appropriate): Lying Oxygen Therapy SpO2: 96 % O2 Device: Tracheostomy Collar O2 Flow Rate (L/min): 5 L/min FiO2 (%): 28 %    Therapy/Group: Individual Therapy  Lorie Phenix 09/22/2019, 5:01 PM

## 2019-09-22 NOTE — Progress Notes (Signed)
Occupational Therapy Session Note  Patient Details  Name: Timothy Le MRN: [id number] Date of Birth: 07-02-1960  Today's Date: 09/22/2019 OT Individual Time: 6213-0865 OT Individual Time Calculation (min): 47 min    Short Term Goals: Week 1:  OT Short Term Goal 1 (Week 1): STG=LTG d/t ELOS  Skilled Therapeutic Interventions/Progress Updates:    1;1. Pt received on toilet with RN present. Pt completes lateral leans for buttock hygiene. Pt completes ambulatory transfer to recliner in front of sink with superviison.  Pt bathes UB with set up and LB with superviison. Pt dresses UB with set up and LB with S with increased time to reach feet/don LB clothing. Pt able to reach feet in recliner in figure 4 for LB dressing/footwear. Pt requesting to do laundry. Pt wanted to do laundry, however unit getting new washer and dryer to e installed today. Pt able to identlify racher use as adaptive strategy for loading and unloading washer/dryer with reacher. Pt misses 13 min skilled OT d/t eating breakfast and wanting to eat before 8am PT session. Exited sessionw iht pt seated in w/c, exit alarm on and call light in reach  Therapy Documentation Precautions:  Precautions Precautions: Fall Precaution Comments: trach collar, PMV Restrictions Weight Bearing Restrictions: No General:   Vital Signs: Therapy Vitals Temp: 98.4 F (36.9 C) Pulse Rate: 70 Resp: 16 BP: 105/67 Patient Position (if appropriate): Lying Oxygen Therapy SpO2: 94 % O2 Device: Tracheostomy Collar Pain:   ADL: ADL Grooming: Setup Where Assessed-Grooming: Sitting at sink Upper Body Bathing: Supervision/safety Where Assessed-Upper Body Bathing: Sitting at sink Lower Body Bathing: Minimal assistance Where Assessed-Lower Body Bathing: Sitting at sink Upper Body Dressing: Setup Where Assessed-Upper Body Dressing: Sitting at sink Lower Body Dressing: Moderate assistance Where Assessed-Lower Body Dressing: Sitting at sink,  Standing at sink Toileting: Minimal assistance Where Assessed-Toileting: Bedside Commode Toilet Transfer: Minimal assistance Toilet Transfer Method: Stand pivot Vision   Perception    Praxis   Exercises:   Other Treatments:     Therapy/Group: Individual Therapy  Tonny Branch 09/22/2019, 7:04 AM

## 2019-09-22 NOTE — Progress Notes (Signed)
Patient ID: Timothy Le, male   DOB: April 23, 1960, 59 y.o.   MRN: [id number]  SW received updates from [id number] [phone] that pt was accepted for HHPT/OT/SLP/SN(trach care edu). [facility] 09/27/2019. SW informed on [facility] and [facility] referral made. SW faxed order to [facility] [phone]).  SW updated pt on above.   [name], MSW, [place] Office: [phone] Cell: [phone] Fax: [phone]

## 2019-09-22 NOTE — Progress Notes (Signed)
[place] PHYSICAL MEDICINE & REHABILITATION PROGRESS NOTE   Subjective/Complaints: Continued low grade temp last night. Felt that he had chills. Water with O2 leaked and spilled onto his face.   ROS: Patient denies fever, rash, sore throat, blurred vision, nausea, vomiting, diarrhea,  shortness of breath or chest pain, joint or back pain, headache, or mood change.   Objective:   No results found. Recent Labs    09/20/19 0158 09/21/19 0431  WBC 13.6* 9.5  HGB 8.9* 8.5*  HCT 29.3* 27.9*  PLT 215 222   Recent Labs    09/20/19 0158  NA 138  K 3.4*  CL 99  CO2 27  GLUCOSE 111*  BUN 20  CREATININE 1.08  CALCIUM 8.2*    Intake/Output Summary (Last 24 hours) at 09/22/2019 1158 Last data filed at 09/22/2019 0813 Gross per 24 hour  Intake 358 ml  Output 1500 ml  Net -1142 ml     Physical Exam: Vital Signs Blood pressure 105/67, pulse 83, temperature 98.4 F (36.9 C), resp. rate 16, height 6' (1.829 m), weight (!) 141.8 kg, SpO2 97 %. Constitutional: No distress . Vital signs reviewed. obese HEENT: EOMI, oral membranes moist Neck: trach without significant drainage or discharge Cardiovascular: RRR without murmur. No JVD    Respiratory/Chest: occ rhonchi. Normal effort    GI/Abdomen: BS +, non-tender, non-distended Ext: no clubbing, cyanosis, or edema Psych: pleasant and cooperative Extremities: Left knee sl tender Skin: Stage 2 pressure injury to buttocks (not visualized today)  Nails long, fungal changes in all toes Neuro: Pt is cognitively appropriate with normal insight, memory, and awareness. Cranial nerves 2-12 are intact. Fine motor coordination is intact. No tremors.5/5 strength throughout. Decreased LT in feet remains    Assessment/Plan: 1. Functional deficits secondary to debility which require 3+ hours per day of interdisciplinary therapy in a comprehensive inpatient rehab setting.  Physiatrist is providing close team supervision and 24 hour management of  active medical problems listed below.  Physiatrist and rehab team continue to assess barriers to discharge/monitor patient progress toward functional and medical goals  Care Tool:  Bathing    Body parts bathed by patient: Right arm, Left arm, Chest, Abdomen, Front perineal area, Buttocks, Right upper leg, Left upper leg, Face, Left lower leg, Right lower leg     Body parts n/a: Right lower leg, Left lower leg   Bathing assist Assist Level: Contact Guard/Touching assist     Upper Body Dressing/Undressing Upper body dressing   What is the patient wearing?: Pull over shirt    Upper body assist Assist Level: Set up assist    Lower Body Dressing/Undressing Lower body dressing      What is the patient wearing?: Underwear/pull up, Pants     Lower body assist Assist for lower body dressing: Supervision/Verbal cueing     Toileting Toileting    Toileting assist Assist for toileting: Contact Guard/Touching assist Assistive Device Comment: urinal   Transfers Chair/bed transfer  Transfers assist     Chair/bed transfer assist level: Supervision/Verbal cueing Chair/bed transfer assistive device: Programmer, multimedia   Ambulation assist   Ambulation activity did not occur: Safety/medical concerns  Assist level: Supervision/Verbal cueing Assistive device: Walker-rolling Max distance: 200 ft   Walk 10 feet activity   Assist  Walk 10 feet activity did not occur: Safety/medical concerns  Assist level: Supervision/Verbal cueing Assistive device: Walker-rolling   Walk 50 feet activity   Assist Walk 50 feet with 2 turns activity did not  occur: Safety/medical concerns  Assist level: Supervision/Verbal cueing Assistive device: Walker-rolling    Walk 150 feet activity   Assist Walk 150 feet activity did not occur: Safety/medical concerns  Assist level: Supervision/Verbal cueing Assistive device: Walker-rolling    Walk 10 feet on uneven surface   activity   Assist Walk 10 feet on uneven surfaces activity did not occur: Safety/medical concerns         Wheelchair     Assist Will patient use wheelchair at discharge?: No (No long-term goals per PT) Type of Wheelchair: Manual Wheelchair activity did not occur: Safety/medical concerns  Wheelchair assist level: Supervision/Verbal cueing Max wheelchair distance: 25 ft    Wheelchair 50 feet with 2 turns activity    Assist    Wheelchair 50 feet with 2 turns activity did not occur: Safety/medical concerns       Wheelchair 150 feet activity     Assist  Wheelchair 150 feet activity did not occur: Safety/medical concerns       Blood pressure 105/67, pulse 83, temperature 98.4 F (36.9 C), resp. rate 16, height 6' (1.829 m), weight (!) 141.8 kg, SpO2 97 %.  Medical Problem List and Plan: 1.  Impaired mobility and ADLs secondary to cardiac and pulmonary debility             -patient may sponge bathe             -ELOS/Goals: 09/25/19,  Mod I  Continue CIR 2.  Antithrombotics: -DVT/anticoagulation:  Pharmaceutical: Lovenox             -antiplatelet therapy: N/A 3. Pain Management: Gabapentin 200mg  TID effective for neuropathy. Was on oxycodone 10mg  q6H PRN (using sparingly- decreased to 5mg  q6H PRN). Has PRN tylenol.         Left knee effusion: ace bandage, taping, ice TID, voltaren gel.    improved  -still c/o feet/toes bothering him 4. Mood: On Seroquel 100mg  BID and Zoloft 100mg  daily.   -mood very positive  5. Neuropsych: This patient is capable of making decisions on his own behalf. 6. Skin/Wound Care: Routine tracheostomy care. Continue Juven bid with multivitamin.   -will need to have toes cut after discharge.   -will try to file toes todayy 7. Fluids/Electrolytes/Nutrition:  8/28: Hypokalemic to 3. 74meq x2 today and restart home 85meq daily.     8/30 persistent, increase to bid dosing.    9/1 3.4   continue current rx 8.. T2DM:  On Lantus 30 with  novolog 8-10 units tid with meals. Intake has been good--continue to monitor BS ac/hs. changed levemir to lantus bid. Added CM restrictions to diet.   9/3: reasonaby controlled 9. COPD: Used MDI with nebs prn wheezing PTA. Has not had to use oxygen for past couple of years. Resumed  MDI.   -Continue hypertonic saline nebs.  10. Chronic CHF: Monitor for signs of overload and check weights daily. Continue lopressor bid with demadex   -9/3 still not checking daily weights!!! Filed Weights   09/20/19 1321  Weight: (!) 141.8 kg    11. Dysuria:    8/30, ucx with 50k pseudomonas   -changed to [place] after discussion with ID  8/31 12. Anxiety: Managed by Zoloft and Seroquel bid.  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 Discussed with therapists. On room air satting well  -continue local trach are  -PMV 14. Fever:     9/3 wbc's down to 9.5  9/2 but still with low grade fever    -  today's lab pending  -likely d/t #11 and likely tracheobronchitis   -cxr without obvious infiltrate, lungs clear on exam  -trach aspirate a few pseudomonas so far  -continue fortaz, discussed rx with pharmacist who will c/w ID pharmacist about addnl coverage  -IS, OOB, local trach care. Encouraged pt to cough up secretions  -mucinex, humidified air     LOS: 7 days A FACE TO FACE EVALUATION WAS PERFORMED  Meredith Staggers 09/22/2019, 11:58 AM

## 2019-09-22 NOTE — Progress Notes (Signed)
Physical Therapy Session Note  Patient Details  Name: KEIRAN GAFFEY MRN: [id number] Date of Birth: [birth year]  Today's Date: 09/22/2019 PT Individual Time: 0805-0904 PT Individual Time Calculation (min): 59 min   Short Term Goals: Week 1:  PT Short Term Goal 1 (Week 1): STG = LTG due to short ELOS.  Skilled Therapeutic Interventions/Progress Updates:  Pt received in w/c & agreeable to tx. Pt requests PT assist him with donning w/c gloves this AM. W/c mobility room>gym with BUE & supervision with pt resting every ~20-25 ft with task focusing on BUE strengthening & cardiopulmonary endurance training. Sit<>stand with RW & supervision. Gait x 100 ft + 200 ft + 100 ft with RW & supervision with pt demonstrating decreased hip flexion LLE during swing phase, decreased dorsiflexion & heel strike BLE, & increasing gait speed as distance progressed. Standing cone taps with BUE support on RW & min assist with PT providing cuing for technique & decreased speed of movement to focus on weight shifting L<>R, BLE coordination, & dynamic balance. Pt unable to progress to holding with only 1UE 2/2 impaired balance. Nu-step on level 3 x 7 minutes with all four extremities with task focusing on global strengthening, endurance training, and coordination of reciprocal movements. Back in room pt transfers sit>supine with mod I. Pt left in bed with alarm set, call bell & all needs in reach. PMSV still donned, & also trach collar donned.  Therapy Documentation Precautions:  Precautions Precautions: Fall Precaution Comments: trach collar, PMV Restrictions Weight Bearing Restrictions: No  Vital Signs: Pt on room air throughout session with PMSV donned & SpO2 >90% throughout  Pain: Pt states my "feet aren't hurting as bad"   Therapy/Group: Individual Therapy  [name] 09/22/2019, 9:12 AM

## 2019-09-22 NOTE — Progress Notes (Signed)
Pharmacy Antibiotic Note  Timothy Le is a 59 y.o. male admitted on 09/15/2019 .   Dysuria: Foley discontinued 8/30.  Fever, Tc 100.4,  TM 101.3.   WBC  10K.  Pharmacy has been consulted for  Ceftazidime dosing for pseudomonas UTI.    8/28 Urine Cx: 50 K  pseudomonas >  Intermediate sensitivity to cipro, sens to ceftazidime, imipenem, gentamicin 8/31 Blood Cx x2:  No growth to date x 3d 9/1 Sputum Cx:  PSEUDOMONAS AERUGINOSA, sens pending  Tc 99.4,   Tmax 100.4 (9/2 pm),  WBC 6>>>13.6>9.5 wnl (9/2)  SCr 1.08 stable. Sputum trach aspirate culture grew pseudomonas so far, sensitivities pending.  Dr. Naaman Plummer asked this pharmacist about  addnl coverage or change in antibiotic, I have discussed case with Nicoletta Dress, Pharm D with Infectious Disease who recommended to continue [name] for now while we wait for sputum susceptibilities. The carbapenem MIC is borderline just like the ceftazidime so would not recommend a change with just low grade fevers, could just be part of his disease course.    Plan: Continue Ceftazidime 2 g IV q8 hours Monitor clinical status, renal function and culture results daily.     Height: 6' (182.9 cm) Weight: (!) 141.8 kg (312 lb 9.6 oz) IBW/kg (Calculated) : 77.6  Temp (24hrs), Avg:99.4 F (37.4 C), Min:98.4 F (36.9 C), Max:100.4 F (38 C)  Recent Labs  Lab 09/16/19 0315 09/18/19 0334 09/20/19 0158 09/21/19 0431  WBC 6.0 10.0 13.6* 9.5  CREATININE 0.95 1.11 1.08  --     Estimated Creatinine Clearance: 108.9 mL/min (by C-G formula based on SCr of 1.08 mg/dL).    No Known Allergies   Thank you for allowing pharmacy to be a part of this patient's care. Nicole Cella, RPh Clinical Pharmacist  Please check AMION for all [place] phone numbers After 10:00 PM, call [place] [phone] 09/22/2019 4:11 PM

## 2019-09-22 NOTE — Progress Notes (Signed)
RT came by to assess patients trach, but pt was in therapy. RT will check back later.

## 2019-09-22 NOTE — Progress Notes (Signed)
Occupational Therapy Session Note  Patient Details  Name: Timothy Le MRN: [id number] Date of Birth: 1960/04/22  Today's Date: 09/22/2019 OT Individual Time: 1324-4010 OT Individual Time Calculation (min): 29 min    Short Term Goals: Week 1:  OT Short Term Goal 1 (Week 1): STG=LTG d/t ELOS  Skilled Therapeutic Interventions/Progress Updates:    1:1. Pt received in bed agreeable ot OT with supervision supine>EOB and CGA amb transfer with RW to w/c. P tpropels w/c part way to outside courtyard and around outside courtyard with up to MIN A for steering on slopes d/t weak BUEs and body habitus. Pt requires frequent rest breaks d/t decreased endruance. Pt with pain in lateral aspect of the dorsal side of RLE with WB and pressure. Visibly swollen. No trauma in tx. Reported started during the afternoon. RN and PT alerted. Exited sessionw iht pt seated in bed, exit alarm on and call light in reach  Therapy Documentation Precautions:  Precautions Precautions: Fall Precaution Comments: trach collar, PMV Restrictions Weight Bearing Restrictions: No General:   Vital Signs: Therapy Vitals Temp: 99.3 F (37.4 C) Pulse Rate: 72 Resp: 16 BP: 109/60 Patient Position (if appropriate): Lying Oxygen Therapy SpO2: 100 % O2 Device: Room Air O2 Flow Rate (L/min): 5 L/min FiO2 (%): 28 % Pain:   ADL: ADL Grooming: Setup Where Assessed-Grooming: Sitting at sink Upper Body Bathing: Supervision/safety Where Assessed-Upper Body Bathing: Sitting at sink Lower Body Bathing: Minimal assistance Where Assessed-Lower Body Bathing: Sitting at sink Upper Body Dressing: Setup Where Assessed-Upper Body Dressing: Sitting at sink Lower Body Dressing: Moderate assistance Where Assessed-Lower Body Dressing: Sitting at sink, Standing at sink Toileting: Minimal assistance Where Assessed-Toileting: Bedside Commode Toilet Transfer: Minimal assistance Toilet Transfer Method: Stand pivot Vision    Perception    Praxis   Exercises:   Other Treatments:     Therapy/Group: Individual Therapy  Tonny Branch 09/22/2019, 3:33 PM

## 2019-09-23 ENCOUNTER — Encounter (HOSPITAL_COMMUNITY): Payer: Medicaid Other | Admitting: Occupational Therapy

## 2019-09-23 LAB — GLUCOSE, CAPILLARY
Glucose-Capillary: 122 mg/dL — ABNORMAL HIGH (ref 70–99)
Glucose-Capillary: 110 mg/dL — ABNORMAL HIGH (ref 70–99)
Glucose-Capillary: 136 mg/dL — ABNORMAL HIGH (ref 70–99)
Glucose-Capillary: 131 mg/dL — ABNORMAL HIGH (ref 70–99)

## 2019-09-23 LAB — CULTURE, RESPIRATORY W GRAM STAIN

## 2019-09-23 NOTE — Progress Notes (Signed)
[facility] PHYSICAL MEDICINE & REHABILITATION PROGRESS NOTE   Subjective/Complaints:   Pt reports feel a little sore, but just took meds.   LBM yesterday - doing well- denied any other issues- Tm yesterday afternoon was 99.3-   ROS:  Pt denies SOB, abd pain, CP, N/V/C/D, and vision changes   Objective:   No results found. Recent Labs    09/21/19 0431 09/22/19 1915  WBC 9.5 8.6  HGB 8.5* 8.9*  HCT 27.9* 29.4*  PLT 222 256   No results for input(s): NA, K, CL, CO2, GLUCOSE, BUN, CREATININE, CALCIUM in the last 72 hours.  Intake/Output Summary (Last 24 hours) at 09/23/2019 1426 Last data filed at 09/23/2019 1401 Gross per 24 hour  Intake 1758.93 ml  Output 1675 ml  Net 83.93 ml     Physical Exam: Vital Signs Blood pressure 117/70, pulse 76, temperature 98.3 F (36.8 C), resp. rate (!) 21, height 6' (1.829 m), weight (!) 141.8 kg, SpO2 97 %. Constitutional: obese gentleman on air bariatric mattress, RN in room giving meds, NAD HEENT: EOMI, oral membranes moist Neck: trach  In place- sats 96% Cardiovascular: RRR   Respiratory/Chest: good air movement- a few rhonchi 1 wheeze heard, B/L    GI/Abdomen: protuberant; Soft, NT, ND, (+)BS  Ext: no clubbing, cyanosis, or edema Psych: pleasant and cooperative Extremities: Left knee sl tender Skin: Stage 2 pressure injury to buttocks (not visualized today)  Nails long, fungal changes in all toes Neuro: Ox3 Cranial nerves 2-12 are intact. Fine motor coordination is intact. No tremors.5/5 strength throughout. Decreased LT in feet remains    Assessment/Plan: 1. Functional deficits secondary to debility which require 3+ hours per day of interdisciplinary therapy in a comprehensive inpatient rehab setting.  Physiatrist is providing close team supervision and 24 hour management of active medical problems listed below.  Physiatrist and rehab team continue to assess barriers to discharge/monitor patient progress toward functional and  medical goals  Care Tool:  Bathing    Body parts bathed by patient: Right arm, Left arm, Chest, Abdomen, Front perineal area, Buttocks, Right upper leg, Left upper leg, Face, Left lower leg, Right lower leg     Body parts n/a: Right lower leg, Left lower leg   Bathing assist Assist Level: Contact Guard/Touching assist     Upper Body Dressing/Undressing Upper body dressing   What is the patient wearing?: Pull over shirt    Upper body assist Assist Level: Set up assist    Lower Body Dressing/Undressing Lower body dressing      What is the patient wearing?: Underwear/pull up, Pants     Lower body assist Assist for lower body dressing: Supervision/Verbal cueing     Toileting Toileting    Toileting assist Assist for toileting: Contact Guard/Touching assist Assistive Device Comment: urinal   Transfers Chair/bed transfer  Transfers assist     Chair/bed transfer assist level: Supervision/Verbal cueing Chair/bed transfer assistive device: Programmer, multimedia   Ambulation assist   Ambulation activity did not occur: Safety/medical concerns  Assist level: Supervision/Verbal cueing Assistive device: Walker-rolling Max distance: 200 ft   Walk 10 feet activity   Assist  Walk 10 feet activity did not occur: Safety/medical concerns  Assist level: Supervision/Verbal cueing Assistive device: Walker-rolling   Walk 50 feet activity   Assist Walk 50 feet with 2 turns activity did not occur: Safety/medical concerns  Assist level: Supervision/Verbal cueing Assistive device: Walker-rolling    Walk 150 feet activity   Assist Walk 150  feet activity did not occur: Safety/medical concerns  Assist level: Supervision/Verbal cueing Assistive device: Walker-rolling    Walk 10 feet on uneven surface  activity   Assist Walk 10 feet on uneven surfaces activity did not occur: Safety/medical concerns         Wheelchair     Assist Will patient  use wheelchair at discharge?: No (No long-term goals per PT) Type of Wheelchair: Manual Wheelchair activity did not occur: Safety/medical concerns  Wheelchair assist level: Supervision/Verbal cueing Max wheelchair distance: 25 ft    Wheelchair 50 feet with 2 turns activity    Assist    Wheelchair 50 feet with 2 turns activity did not occur: Safety/medical concerns       Wheelchair 150 feet activity     Assist  Wheelchair 150 feet activity did not occur: Safety/medical concerns       Blood pressure 117/70, pulse 76, temperature 98.3 F (36.8 C), resp. rate (!) 21, height 6' (1.829 m), weight (!) 141.8 kg, SpO2 97 %.  Medical Problem List and Plan: 1.  Impaired mobility and ADLs secondary to cardiac and pulmonary debility             -patient may sponge bathe             -ELOS/Goals: 09/25/19,  Mod I  Continue CIR 2.  Antithrombotics: -DVT/anticoagulation:  Pharmaceutical: Lovenox             -antiplatelet therapy: N/A 3. Pain Management: Gabapentin 200mg  TID effective for neuropathy. Was on oxycodone 10mg  q6H PRN (using sparingly- decreased to 5mg  q6H PRN). Has PRN tylenol.         Left knee effusion: ace bandage, taping, ice TID, voltaren gel.    improved  -still c/o feet/toes bothering him 4. Mood: On Seroquel 100mg  BID and Zoloft 100mg  daily.   -mood very positive  5. Neuropsych: This patient is capable of making decisions on his own behalf. 6. Skin/Wound Care: Routine tracheostomy care. Continue Juven bid with multivitamin.   -will need to have toes cut after discharge.   -will try to file toes todayy 7. Fluids/Electrolytes/Nutrition:  8/28: Hypokalemic to 3. 54meq x2 today and restart home 43meq daily.     8/30 persistent, increase to bid dosing.    9/1 3.4   continue current rx 8.. T2DM:  On Lantus 30 with novolog 8-10 units tid with meals. Intake has been good--continue to monitor BS ac/hs. changed levemir to lantus bid. Added CM restrictions to diet.    9/3: reasonaby controlled  9/4- BGs 110s-130s- looks pretty good- con't regimen 9. COPD: Used MDI with nebs prn wheezing PTA. Has not had to use oxygen for past couple of years. Resumed  MDI.   -Continue hypertonic saline nebs.  10. Chronic CHF: Monitor for signs of overload and check weights daily. Continue lopressor bid with demadex   -9/3 still not checking daily weights!!!  9/4- has order for daily weights as of 9/2- don't have weight today- will check with nursing [facility] Weights   09/20/19 1321  Weight: (!) 141.8 kg    11. Dysuria:    8/30, ucx with 50k pseudomonas   -changed to [place] after discussion with ID  8/31 12. Anxiety: Managed by Zoloft and Seroquel bid.  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 Discussed with therapists. On room air satting well  -continue local trach are  -PMV 14. Fever:     9/3 wbc's down to 9.5  9/2 but still with  low grade fever    -today's lab pending  -likely d/t #11 and likely tracheobronchitis   -cxr without obvious infiltrate, lungs clear on exam  -trach aspirate a few pseudomonas so far  -continue fortaz, discussed rx with pharmacist who will c/w ID pharmacist about addnl coverage  -IS, OOB, local trach care. Encouraged pt to cough up secretions  -mucinex, humidified air  9/4- no Fever and WBC is down-trending- con't to monitor    LOS: 8 days A FACE TO FACE EVALUATION WAS PERFORMED  Nilay Mangrum 09/23/2019, 2:26 PM

## 2019-09-23 NOTE — Progress Notes (Signed)
Occupational Therapy Session Note  Patient Details  Name: Timothy Le MRN: [id number] Date of Birth: Aug 24, 1960  Today's Date: 09/23/2019 OT Group Time: 1100-1200 OT Group Time Calculation (min): 60 min  Short Term Goals: Week 1:  OT Short Term Goal 1 (Week 1): STG=LTG d/t ELOS  Skilled Therapeutic Interventions/Progress Updates:    Pt engaged in therapeutic w/c level dance group focusing on patient choice, UE/LE strengthening, salience, activity tolerance, and social participation. Pt was guided through various dance-based exercises involving UEs/LEs and trunk. All music was selected by group members. Emphasis placed on cardiopulmonary endurance and general strengthening. He declined standing though was active with exercising w/c level, requesting songs, and interacting with other group members. No SOB noted, 02 sats 98% on RA while wearing PMSV when assessed in the middle of the session. At end of tx pt was returned to room by RT.   Therapy Documentation Precautions:  Precautions Precautions: Fall Precaution Comments: trach collar, PMV Restrictions Weight Bearing Restrictions: No Vital Signs: Therapy Vitals Temp: 98.6 F (37 C) Pulse Rate: 71 Resp: 17 BP: 103/66 Patient Position (if appropriate): Lying Oxygen Therapy SpO2: 98 % O2 Device: Room Air Pain: no s/s pain during tx   ADL: ADL Grooming: Setup Where Assessed-Grooming: Sitting at sink Upper Body Bathing: Supervision/safety Where Assessed-Upper Body Bathing: Sitting at sink Lower Body Bathing: Minimal assistance Where Assessed-Lower Body Bathing: Sitting at sink Upper Body Dressing: Setup Where Assessed-Upper Body Dressing: Sitting at sink Lower Body Dressing: Moderate assistance Where Assessed-Lower Body Dressing: Sitting at sink, Standing at sink Toileting: Minimal assistance Where Assessed-Toileting: Bedside Commode Toilet Transfer: Minimal assistance Toilet Transfer Method: Stand pivot       Therapy/Group: Group Therapy  Renee Beale A Santiana Glidden 09/23/2019, 3:44 PM

## 2019-09-23 NOTE — Progress Notes (Signed)
Occupational Therapy Weekly Progress Note  Patient Details  Name: Timothy Le MRN: [id number] Date of Birth: 06-22-1960  Beginning of progress report period: September 16, 2019 End of progress report period: September 24, 2019  Today's Date: 09/24/2019 OT Individual Time: 1000-1030 OT Individual Time Calculation (min): 30 min   STGs oringinally set to LTGs and pt currents S overall for mobility and ADLs with goal level MOD I. Pt progressing well, however recent infection and mold infestation at home preventing planned DC, therefore extended slightly. Pt continues to benefit from skilled OT to improve activity tolerance, O2 support and balance impacting safety/independence with ADLs/IADLs.  Patient continues to demonstrate the following deficits: muscle weakness, decreased cardiorespiratoy endurance and decreased standing balance, decreased postural control and decreased balance strategies and therefore will continue to benefit from skilled OT intervention to enhance overall performance with BADL and iADL.  Patient progressing toward long term goals..  Continue plan of care.  OT Short Term Goals Week 1:  OT Short Term Goal 1 (Week 1): STG=LTG d/t ELOS  Skilled Therapeutic Interventions/Progress Updates:    1:1. Pt received in bed agreeable to OT requesting to wash body and clothing however hooked up to IV and Per RN only IV team able to [facility] PICC. Pt agreeable to getting OOB and grooming at sink. OT manages IV line and pt completes superviison transfer supine>w/c with RW and VC for safe reach back to w/c. Pt completes grooming at sink with set up after close quarters positioning of w/c maneuver with VC for steering pattern in tight spaces. Exited session with pt seated in w/c, exit alarm on and call light in reach  Therapy Documentation Precautions:  Precautions Precautions: Fall Precaution Comments: trach collar, PMV Restrictions Weight Bearing Restrictions: No General:   Vital  Signs:   Pain:   ADL: ADL Grooming: Setup Where Assessed-Grooming: Sitting at sink Upper Body Bathing: Supervision/safety Where Assessed-Upper Body Bathing: Sitting at sink Lower Body Bathing: Minimal assistance Where Assessed-Lower Body Bathing: Sitting at sink Upper Body Dressing: Setup Where Assessed-Upper Body Dressing: Sitting at sink Lower Body Dressing: Moderate assistance Where Assessed-Lower Body Dressing: Sitting at sink, Standing at sink Toileting: Minimal assistance Where Assessed-Toileting: Bedside Commode Toilet Transfer: Minimal assistance Toilet Transfer Method: Stand pivot Vision   Perception    Praxis   Exercises:   Other Treatments:     Therapy/Group: Individual Therapy  Tonny Branch 09/23/2019, 9:03 AM

## 2019-09-24 ENCOUNTER — Inpatient Hospital Stay (HOSPITAL_COMMUNITY): Payer: Medicaid Other

## 2019-09-24 LAB — CULTURE, BLOOD (ROUTINE X 2)
Culture: NO GROWTH
Culture: NO GROWTH
Special Requests: ADEQUATE

## 2019-09-24 LAB — GLUCOSE, CAPILLARY
Glucose-Capillary: 115 mg/dL — ABNORMAL HIGH (ref 70–99)
Glucose-Capillary: 84 mg/dL (ref 70–99)
Glucose-Capillary: 144 mg/dL — ABNORMAL HIGH (ref 70–99)
Glucose-Capillary: 121 mg/dL — ABNORMAL HIGH (ref 70–99)

## 2019-09-24 NOTE — Progress Notes (Signed)
Occupational Therapy Session Note  Patient Details  Name: Timothy Le MRN: [id number] Date of Birth: [birth year]  Today's Date: 09/24/2019 OT Individual Time: 1400-1500 OT Individual Time Calculation (min): 60 min    Short Term Goals: Week 1:  OT Short Term Goal 1 (Week 1): STG=LTG d/t ELOS  Skilled Therapeutic Interventions/Progress Updates:    1;1. Pt received in bed agreeable to OT. Pt completes ambulatory transfers throughout session with MOD I using RW with no VC for hand placement with exception of TTB transfer with superviison and VC for sequencing on new DME. Review of curtain and HH shower head modifications to make bathroom safer. Pt completes seated bathing and dressing tasks in recliner at sink with MOD I. Pt would benefit from full shower practice with trach collar protector prior to DC home. Pt voids bladder standing with S and reviewed seated voiding for safety d/t balance deficits. Pt verbalized understanding. Pt completes making cup of coffee with 2 self initiated rest breaks with S overall. Plan to address more IADL tasks tomorrow in prep for d/c home. Exited session with p tseated in bed, exi talarm ona nd call light in reach  Therapy Documentation Precautions:  Precautions Precautions: Fall Precaution Comments: trach collar, PMV Restrictions Weight Bearing Restrictions: No General:   Vital Signs:   Pain:   ADL: ADL Grooming: Setup Where Assessed-Grooming: Sitting at sink Upper Body Bathing: Supervision/safety Where Assessed-Upper Body Bathing: Sitting at sink Lower Body Bathing: Minimal assistance Where Assessed-Lower Body Bathing: Sitting at sink Upper Body Dressing: Setup Where Assessed-Upper Body Dressing: Sitting at sink Lower Body Dressing: Moderate assistance Where Assessed-Lower Body Dressing: Sitting at sink, Standing at sink Toileting: Minimal assistance Where Assessed-Toileting: Bedside Commode Toilet Transfer: Minimal assistance Toilet  Transfer Method: Stand pivot Vision   Perception    Praxis   Exercises:   Other Treatments:     Therapy/Group: Individual Therapy  Tonny Branch 09/24/2019, 2:23 PM

## 2019-09-24 NOTE — Progress Notes (Signed)
Physical Therapy Session Note  Patient Details  Name: Timothy Le MRN: [id number] Date of Birth: [birth year]  Today's Date: 09/24/2019 PT Individual Time: 1105-1200 PT Individual Time Calculation (min): 55 min   Short Term Goals: Week 1:  PT Short Term Goal 1 (Week 1): STG = LTG due to short ELOS.  Skilled Therapeutic Interventions/Progress Updates:     Patient in w/c in the room upon PT arrival. Patient alert and agreeable to PT session. Patient denied pain during session. Reported that his toe nails had not been filed down, Therapist, sports and MD reported that there was no device to perform this today. Discussed patient setting up an appointment with his Podiatrist to get his toe nails cut soon after d/c home, patient in agreement. Donned large TED hose with total A to reduce pressure on patient's toe nails in his shoes, as patient only has thick non-skid socks available at this time, Then donned B tennis shoes with min A. Patient denied foot pain throughout session.   Therapeutic Activity: Transfers: Patient performed sit to/from stand x4 from the w/c and x1 from the toilet with supervision-mod I. Provided verbal cues for controlled movements x2 for safety. Patient performed a simulated van height car transfer with close supervision for safety using RW. Provided cues for safe technique. Patient was continent of bowl and bladder during session, performed peri-care and LB clothing management mod I.   Gait Training:  Patient ambulated 35 ft after car transfer to simulate walking from the car to his apartment, then 35 feet and up/down a 7" step using a RW with supervision-CGA to simulate home entry. He also ambulated 108 feet and 226 feet over level surfaces using a RW. He ambulated with mod I-supervision for safety and had a w/c follow for longer distances due to decreased activity tolerance. Provided verbal cues for paced breathing and paced stepping for increased activity tolerance with gait.   Patient  in w/c in the room at end of session with breaks locked, chair alarm set, and all needs within reach.   Therapy Documentation Precautions:  Precautions Precautions: Fall Precaution Comments: trach collar, PMV Restrictions Weight Bearing Restrictions: No   Therapy/Group: Individual Therapy  Davonne Jarnigan L Raena Pau PT, DPT  09/24/2019, 3:46 PM

## 2019-09-24 NOTE — Plan of Care (Signed)
  Problem: Consults Goal: RH GENERAL PATIENT EDUCATION Description: See Patient Education module for education specifics. Outcome: Progressing Goal: Skin Care Protocol Initiated - if Braden Score 18 or less Description: If consults are not indicated, leave blank or document N/A Outcome: Progressing Goal: Nutrition Consult-if indicated Outcome: Progressing Goal: Diabetes Guidelines if Diabetic/Glucose > 140 Description: If diabetic or lab glucose is > 140 mg/dl - Initiate Diabetes/Hyperglycemia Guidelines & Document Interventions  Outcome: Progressing   Problem: RH BOWEL ELIMINATION Goal: RH STG MANAGE BOWEL WITH ASSISTANCE Description: STG Manage Bowel with Mod I Assistance. Outcome: Progressing Goal: RH STG MANAGE BOWEL W/MEDICATION W/ASSISTANCE Description: STG Manage Bowel with Medication with Mod I Assistance. Outcome: Progressing   Problem: RH BLADDER ELIMINATION Goal: RH STG MANAGE BLADDER WITH ASSISTANCE Description: STG Manage Bladder With Mod I Assistance Outcome: Progressing Goal: RH STG MANAGE BLADDER WITH MEDICATION WITH ASSISTANCE Description: STG Manage Bladder With Medication With Mod I Assistance. Outcome: Progressing Goal: RH STG MANAGE BLADDER WITH EQUIPMENT WITH ASSISTANCE Description: STG Manage Bladder With Equipment With Mod I Assistance Outcome: Progressing   Problem: RH SKIN INTEGRITY Goal: RH STG SKIN FREE OF INFECTION/BREAKDOWN Description: Skin to remain free from infection and breakdown while on CIR with Mod I assistance. Outcome: Progressing Goal: RH STG MAINTAIN SKIN INTEGRITY WITH ASSISTANCE Description: STG Maintain Skin Integrity With Mod I Assistance. Outcome: Progressing Goal: RH STG ABLE TO PERFORM INCISION/WOUND CARE W/ASSISTANCE Description: STG Able To Perform Incision/Wound Care With Mod I Assistance. Outcome: Progressing   Problem: RH SAFETY Goal: RH STG ADHERE TO SAFETY PRECAUTIONS W/ASSISTANCE/DEVICE Description: STG Adhere to  Safety Precautions With Mod I Assistance and appropriate assistive Device. Outcome: Progressing Goal: RH STG DECREASED RISK OF FALL WITH ASSISTANCE Description: STG Decreased Risk of Fall With Mod I Assistance. Outcome: Progressing   Problem: RH PAIN MANAGEMENT Goal: RH STG PAIN MANAGED AT OR BELOW PT'S PAIN GOAL Description: <4 on a 0-10 pain scale Outcome: Progressing   Problem: RH KNOWLEDGE DEFICIT GENERAL Goal: RH STG INCREASE KNOWLEDGE OF SELF CARE AFTER HOSPITALIZATION Description: Patient and caregiver will demonstrate knowledge on medication management, diabetes management, respiratory management, trach care management, follow up medical care management, wound care management with Mod I assistance from CIR staff at discharge. Outcome: Progressing

## 2019-09-24 NOTE — Progress Notes (Signed)
RT came to assess patient.  Patient currently in therapy.  Will check back.

## 2019-09-24 NOTE — Progress Notes (Signed)
[place] PHYSICAL MEDICINE & REHABILITATION PROGRESS NOTE   Subjective/Complaints:  Pt reports biggest issues is toenails- very thick and very long-  Feet are sore as a result- didn't get the filing of toenails that was planned last week.   Explained I don't have a way to fix toenails currently, but will pass on to primary physician, Dr Naaman Plummer.    ROS:   Pt denies SOB, abd pain, CP, N/V/C/D, and vision changes   Objective:   No results found. Recent Labs    09/22/19 1915  WBC 8.6  HGB 8.9*  HCT 29.4*  PLT 256   No results for input(s): NA, K, CL, CO2, GLUCOSE, BUN, CREATININE, CALCIUM in the last 72 hours.  Intake/Output Summary (Last 24 hours) at 09/24/2019 1343 Last data filed at 09/24/2019 1336 Gross per 24 hour  Intake 720 ml  Output 2160 ml  Net -1440 ml     Physical Exam: Vital Signs Blood pressure 109/63, pulse 63, temperature (!) 97.3 F (36.3 C), resp. rate 19, height 6' (1.829 m), weight (!) 141.8 kg, SpO2 99 %. Constitutional: obese male laying in bariatric air mattress; also seen in hallway with PT, has IV pole, NAD HEENT: EOMI, oral membranes moist Neck: trach  In place- sats 95% this AM Cardiovascular: borderline bradycardia- regular rhythm  Respiratory/Chest: good air movement- a little wheezy, B/L   GI/Abdomen: protuberant; Soft, NT, ND, (+)BS   Ext: no clubbing, cyanosis, or edema Psych: appropriate Extremities: Left knee sl tender Skin: Stage 2 pressure injury to buttocks (not visualized today)  Nails long, fungal changes in all toes- no change Neuro: Ox3 Cranial nerves 2-12 are intact. Fine motor coordination is intact. No tremors.5/5 strength throughout. Decreased LT in feet remains    Assessment/Plan: 1. Functional deficits secondary to debility which require 3+ hours per day of interdisciplinary therapy in a comprehensive inpatient rehab setting.  Physiatrist is providing close team supervision and 24 hour management of active medical  problems listed below.  Physiatrist and rehab team continue to assess barriers to discharge/monitor patient progress toward functional and medical goals  Care Tool:  Bathing    Body parts bathed by patient: Right arm, Left arm, Chest, Abdomen, Front perineal area, Buttocks, Right upper leg, Left upper leg, Face, Left lower leg, Right lower leg     Body parts n/a: Right lower leg, Left lower leg   Bathing assist Assist Level: Contact Guard/Touching assist     Upper Body Dressing/Undressing Upper body dressing   What is the patient wearing?: Pull over shirt    Upper body assist Assist Level: Set up assist    Lower Body Dressing/Undressing Lower body dressing      What is the patient wearing?: Underwear/pull up, Pants     Lower body assist Assist for lower body dressing: Supervision/Verbal cueing     Toileting Toileting    Toileting assist Assist for toileting: Contact Guard/Touching assist Assistive Device Comment: urinal   Transfers Chair/bed transfer  Transfers assist     Chair/bed transfer assist level: Supervision/Verbal cueing Chair/bed transfer assistive device: Programmer, multimedia   Ambulation assist   Ambulation activity did not occur: Safety/medical concerns  Assist level: Supervision/Verbal cueing Assistive device: Walker-rolling Max distance: 200 ft   Walk 10 feet activity   Assist  Walk 10 feet activity did not occur: Safety/medical concerns  Assist level: Supervision/Verbal cueing Assistive device: Walker-rolling   Walk 50 feet activity   Assist Walk 50 feet with 2 turns activity  did not occur: Safety/medical concerns  Assist level: Supervision/Verbal cueing Assistive device: Walker-rolling    Walk 150 feet activity   Assist Walk 150 feet activity did not occur: Safety/medical concerns  Assist level: Supervision/Verbal cueing Assistive device: Walker-rolling    Walk 10 feet on uneven surface   activity   Assist Walk 10 feet on uneven surfaces activity did not occur: Safety/medical concerns         Wheelchair     Assist Will patient use wheelchair at discharge?: No (No long-term goals per PT) Type of Wheelchair: Manual Wheelchair activity did not occur: Safety/medical concerns  Wheelchair assist level: Supervision/Verbal cueing Max wheelchair distance: 25 ft    Wheelchair 50 feet with 2 turns activity    Assist    Wheelchair 50 feet with 2 turns activity did not occur: Safety/medical concerns       Wheelchair 150 feet activity     Assist  Wheelchair 150 feet activity did not occur: Safety/medical concerns       Blood pressure 109/63, pulse 63, temperature (!) 97.3 F (36.3 C), resp. rate 19, height 6' (1.829 m), weight (!) 141.8 kg, SpO2 99 %.  Medical Problem List and Plan: 1.  Impaired mobility and ADLs secondary to cardiac and pulmonary debility             -patient may sponge bathe             -ELOS/Goals: 09/25/19,  Mod I  Continue CIR 2.  Antithrombotics: -DVT/anticoagulation:  Pharmaceutical: Lovenox             -antiplatelet therapy: N/A 3. Pain Management: Gabapentin 200mg  TID effective for neuropathy. Was on oxycodone 10mg  q6H PRN (using sparingly- decreased to 5mg  q6H PRN). Has PRN tylenol.         Left knee effusion: ace bandage, taping, ice TID, voltaren gel.    improved  -still c/o feet/toes bothering him  9/5- says feet pain is partially from long toenails and not being able to wear shoes 4. Mood: On Seroquel 100mg  BID and Zoloft 100mg  daily.   -mood very positive  5. Neuropsych: This patient is capable of making decisions on his own behalf. 6. Skin/Wound Care: Routine tracheostomy care. Continue Juven bid with multivitamin.   -will need to have toes cut after discharge.   -will try to file toes todayy 7. Fluids/Electrolytes/Nutrition:  8/28: Hypokalemic to 3. 21meq x2 today and restart home 76meq daily.     8/30  persistent, increase to bid dosing.    9/1 3.4   continue current rx 8.. T2DM:  On Lantus 30 with novolog 8-10 units tid with meals. Intake has been good--continue to monitor BS ac/hs. changed levemir to lantus bid. Added CM restrictions to diet.   9/3: reasonaby controlled  9/4- BGs 84-130s- con't regimen 9. COPD: Used MDI with nebs prn wheezing PTA. Has not had to use oxygen for past couple of years. Resumed  MDI.   -Continue hypertonic saline nebs.  10. Chronic CHF: Monitor for signs of overload and check weights daily. Continue lopressor bid with demadex   -9/3 still not checking daily weights!!!  9/4- has order for daily weights as of 9/2- don't have weight today- will check with nursing  9/5- no weight in computer- likely an issue with bed- will check again.  Filed Weights   09/20/19 1321  Weight: (!) 141.8 kg    11. Dysuria:    8/30, ucx with 50k pseudomonas   -changed to [place] after  discussion with ID  8/31 12. Anxiety: Managed by Zoloft and Seroquel bid.  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 Discussed with therapists. On room air satting well  -continue local trach are  -PMV 14. Fever:     9/3 wbc's down to 9.5  9/2 but still with low grade fever    -today's lab pending  -likely d/t #11 and likely tracheobronchitis   -cxr without obvious infiltrate, lungs clear on exam  -trach aspirate a few pseudomonas so far  -continue fortaz, discussed rx with pharmacist who will c/w ID pharmacist about addnl coverage  -IS, OOB, local trach care. Encouraged pt to cough up secretions  -mucinex, humidified air  9/4- no Fever and WBC is down-trending- con't to monitor  9/5- resp following   LOS: 9 days A FACE TO FACE EVALUATION WAS PERFORMED  [place] 09/24/2019, 1:43 PM

## 2019-09-25 ENCOUNTER — Inpatient Hospital Stay (HOSPITAL_COMMUNITY): Payer: Medicaid Other | Admitting: Physical Therapy

## 2019-09-25 ENCOUNTER — Inpatient Hospital Stay (HOSPITAL_COMMUNITY): Payer: Medicaid Other

## 2019-09-25 LAB — CBC
HCT: 30.4 % — ABNORMAL LOW (ref 39.0–52.0)
Hemoglobin: 9.4 g/dL — ABNORMAL LOW (ref 13.0–17.0)
MCH: 27.6 pg (ref 26.0–34.0)
MCHC: 30.9 g/dL (ref 30.0–36.0)
MCV: 89.1 fL (ref 80.0–100.0)
Platelets: 266 10*3/uL (ref 150–400)
RBC: 3.41 MIL/uL — ABNORMAL LOW (ref 4.22–5.81)
RDW: 16.8 % — ABNORMAL HIGH (ref 11.5–15.5)
WBC: 7 10*3/uL (ref 4.0–10.5)
nRBC: 0 % (ref 0.0–0.2)

## 2019-09-25 LAB — BASIC METABOLIC PANEL
Anion gap: 8 (ref 5–15)
BUN: 20 mg/dL (ref 6–20)
CO2: 29 mmol/L (ref 22–32)
Calcium: 8.3 mg/dL — ABNORMAL LOW (ref 8.9–10.3)
Chloride: 101 mmol/L (ref 98–111)
Creatinine, Ser: 1.06 mg/dL (ref 0.61–1.24)
GFR calc Af Amer: >60 mL/min (ref >60)
GFR calc non Af Amer: >60 mL/min (ref >60)
Glucose, Bld: 123 mg/dL — ABNORMAL HIGH (ref 70–99)
Potassium: 3.7 mmol/L (ref 3.5–5.1)
Sodium: 138 mmol/L (ref 135–145)

## 2019-09-25 LAB — GLUCOSE, CAPILLARY
Glucose-Capillary: 107 mg/dL — ABNORMAL HIGH (ref 70–99)
Glucose-Capillary: 124 mg/dL — ABNORMAL HIGH (ref 70–99)
Glucose-Capillary: 128 mg/dL — ABNORMAL HIGH (ref 70–99)
Glucose-Capillary: 133 mg/dL — ABNORMAL HIGH (ref 70–99)

## 2019-09-25 NOTE — Progress Notes (Signed)
Physical Therapy Session Note  Patient Details  Name: Timothy Le MRN: [id number] Date of Birth: Oct 26, 1960  Today's Date: 09/25/2019 PT Individual Time: 3220-2542 PT Individual Time Calculation (min): 56 min   Short Term Goals: Week 1:  PT Short Term Goal 1 (Week 1): STG = LTG due to short ELOS.  Skilled Therapeutic Interventions/Progress Updates: Pt presents sitting in w/c and agreeable to therapy. Pt's IV is completed and remained in room performing Berg Balance test for nursing to D/C.  Pt scored 30/56 on Berg representing high risk for falls.  Pt unable to perform SLS, Tandem or near tandem stance or toe tapping.  Pt wheeled to gym and pt amb multiple trials of 170' w/ RW and mod I.  Pt required 2 standing rest breaks during 1 trial,but then able to complete without.  Pt returned to room and amb to far side of bed w/ RW and mod I.  Pt sitting EOB for lunch w/ all needs in reach.  Spoke w/ NT re: pt concern that bed is not firm enough (although at max of firmness) and new cupof water brought.     Therapy Documentation Precautions:  Precautions Precautions: Fall Precaution Comments: trach collar, PMV Restrictions Weight Bearing Restrictions: No General:   Vital Signs:   Pain: no c/o pain    Mobility:   Locomotion : Gait Ambulation: Yes Gait Assistance: Independent with assistive device;Supervision/Verbal cueing (Mod I household distances, supervision for distances >50 ft due to decreased activity tolerance) Gait Distance (Feet): 226 Feet Assistive device: Rolling walker Gait Gait: Yes Gait Pattern: Step-through pattern;Decreased hip/knee flexion - right;Decreased hip/knee flexion - left;Lateral hip instability;Trunk flexed;Wide base of support Gait velocity: decreased Stairs / Additional Locomotion Stairs: Yes Stairs Assistance: Supervision/Verbal cueing Stair Management Technique: With walker Height of Stairs: 7 Curb: Supervision/Verbal cueing Wheelchair  Mobility 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, petroleum)  Trunk/Postural Assessment :    Balance: Standardized Balance Assessment Standardized Balance Assessment: Furniture conservator/restorer Berg Balance Test Sit to Stand: Able to stand  independently using hands Standing Unsupported: Able to stand safely 2 minutes Sitting with Back Unsupported but Feet Supported on Floor or Stool: Able to sit safely and securely 2 minutes Stand to Sit: Sits safely with minimal use of hands Transfers: Able to transfer safely, definite need of hands Standing Unsupported with Eyes Closed: Able to stand 10 seconds with supervision Standing Ubsupported with Feet Together: Needs help to attain position and unable to hold for 15 seconds From Standing, Reach Forward with Outstretched Arm: Reaches forward but needs supervision From Standing Position, Pick up Object from Floor: Able to pick up shoe, needs supervision From Standing Position, Turn to Look Behind Over each Shoulder: Looks behind one side only/other side shows less weight shift Turn 360 Degrees: Able to turn 360 degrees safely but slowly Standing Unsupported, Alternately Place Feet on Step/Stool: Needs assistance to keep from falling or unable to try Standing Unsupported, One Foot in Front: Loses balance while stepping or standing Standing on One Leg: Unable to try or needs assist to prevent fall Total Score: 30 Static Sitting Balance Static Sitting - Balance Support: No upper extremity supported;Feet unsupported Static Sitting - Level of Assistance: 7: Independent Dynamic Sitting Balance Dynamic Sitting - Balance Support: No upper extremity supported;Feet supported;During functional activity Dynamic Sitting - Level of Assistance: 7: Independent Dynamic Sitting - Balance Activities: Lateral lean/weight shifting;Forward lean/weight shifting;Reaching for objects Static Standing Balance Static Standing - Balance Support: Right upper extremity  supported;Left  upper extremity supported Static Standing - Level of Assistance: 6: Modified independent (Device/Increase time) Dynamic Standing Balance Dynamic Standing - Balance Support: Right upper extremity supported;Left upper extremity supported;During functional activity Dynamic Standing - Level of Assistance: 6: Modified independent (Device/Increase time) Dynamic Standing - Balance Activities: Lateral lean/weight shifting;Forward lean/weight shifting;Reaching for objects Exercises:   Other Treatments:      Therapy/Group: Individual Therapy  Timothy Le 09/25/2019, 12:30 PM

## 2019-09-25 NOTE — Progress Notes (Signed)
Occupational Therapy Discharge Summary  Patient Details  Name: Timothy Le MRN: [id number] Date of Birth: 04/16/1960  Today's Date: 09/25/2019 OT Individual Time: 1000-1100 OT Individual Time Calculation (min): 60 min     1:1. Pt received in w/c and agreeable to IADL tasks in kitchen. Pt currently MOD I for sink level bathing, dressing and toileting. Pt completes gathering needed items at ambulatory level and bringing them to a seated workspace as pt did PTA. Pt demo disorganized searching pattern and OT educates on using mixing bowl to gather all ingredients as well as walker bag and sliding bowl across counter to improve tranporting of items/safe RW management. Pt verbalized understnaing. Pt requries overall MOD I for gathering items at [place] except for 1 LOB posteriorly while reaching forwards with no UE support. Pt able to catch balance without A however edu re keeping 1UE for support on RW during reaching. Pt generally demo poor insight into balance deficits Pt completes mixing ingredients, scooping cookies, loading backing sheet into oven and washing dishes from seated level (self selected) for energy conservation. Pt demo good kitchen safety during baking task. Pt provided with handout on energy conservaiton strategies for IADLs and returned to room, call light in reach and all nee smet.   Patient has met 10 of 10 long term goals due to improved activity tolerance, improved balance, postural control, ability to compensate for deficits and improved coordination.  Patient to discharge at overall Modified Independent level.  Pt will have CNA assist with bathing and IADL tasks as needed.    Reasons goals not met: n/a  Recommendation:  Patient will benefit from ongoing skilled OT services in home health setting to continue to advance functional skills in the area of BADL and iADL.  Equipment: TTB  Reasons for discharge: treatment goals met and discharge from hospital  Patient/family agrees  with progress made and goals achieved: Yes  OT Discharge Precautions/Restrictions  Precautions Precautions: Fall Precaution Comments: trach collar, PMV Restrictions Weight Bearing Restrictions: No General   Vital Signs Therapy Vitals Temp: 97.9 F (36.6 C) Pulse Rate: 69 Resp: 19 BP: 109/61 Patient Position (if appropriate): Lying Oxygen Therapy O2 Device: Room Air Pain Pain Assessment Pain Score: 0-No pain ADL ADL Eating: Modified independent Grooming: Modified independent Where Assessed-Grooming: Sitting at sink Upper Body Bathing: Modified independent Where Assessed-Upper Body Bathing: Sitting at sink Lower Body Bathing: Modified independent Where Assessed-Lower Body Bathing: Sitting at sink Upper Body Dressing: Modified independent (Device) Where Assessed-Upper Body Dressing: Sitting at sink Lower Body Dressing: Modified independent Where Assessed-Lower Body Dressing: Sitting at sink, Standing at sink Toileting: Modified independent Where Assessed-Toileting: Bedside Commode Toilet Transfer: Modified independent Toilet Transfer Method: Stand pivot Tub/Shower Transfer: Distant supervision Vision Baseline Vision/History: No visual deficits Patient Visual Report: No change from baseline Vision Assessment?: No apparent visual deficits Perception  Perception: Within Functional Limits Praxis Praxis: Intact Cognition Overall Cognitive Status: Within Functional Limits for tasks assessed Arousal/Alertness: Awake/alert Memory: Appears intact Awareness: Appears intact Problem Solving: Appears intact Safety/Judgment: Appears intact Sensation Sensation Light Touch: Impaired Detail Peripheral sensation comments: hx of peripheral neuopathy in B feet Light Touch Impaired Details: Impaired RLE;Impaired LLE (intermittent decreased light touch with tingling) Proprioception: Appears Intact Coordination Heel Shin Test: [facility] Motor  Motor Motor: Abnormal postural  alignment and control Motor - Discharge Observations: Continued generalized deconditioning with improvement during admission Mobility  Bed Mobility Bed Mobility: Rolling Right;Rolling Left;Supine to Sit;Sit to Supine Rolling Right: Independent Rolling Left: Independent Supine to Sit: Independent  with assistive device (HOB elevated to simulate home set up) Sit to Supine: Independent with assistive device (HOB elevated to simulate home set up) Transfers Sit to Stand: Independent with assistive device Stand to Sit: Independent with assistive device  Trunk/Postural Assessment  Cervical Assessment Cervical Assessment: Exceptions to [facility] (forward head) Thoracic Assessment Thoracic Assessment: Exceptions to [facility] (rounded shoulders) Lumbar Assessment Lumbar Assessment: Exceptions to [facility] (posterior pelvic tilt) Postural Control Postural Control: Deficits on evaluation Righting Reactions: delayed Protective Responses: delayed  Balance Standardized Balance Assessment Standardized Balance Assessment: Berg Balance Test Static Sitting Balance Static Sitting - Balance Support: No upper extremity supported;Feet unsupported Static Sitting - Level of Assistance: 7: Independent Dynamic Sitting Balance Dynamic Sitting - Balance Support: Feet supported;During functional activity Dynamic Sitting - Level of Assistance: 7: Independent Dynamic Sitting - Balance Activities: Lateral lean/weight shifting;Forward lean/weight shifting;Reaching for objects Static Standing Balance Static Standing - Balance Support: No upper extremity supported;During functional activity Static Standing - Level of Assistance: 6: Modified independent (Device/Increase time) Dynamic Standing Balance Dynamic Standing - Balance Support: Right upper extremity supported;Left upper extremity supported;During functional activity Dynamic Standing - Level of Assistance: 6: Modified independent (Device/Increase time) Dynamic Standing -  Balance Activities: Lateral lean/weight shifting;Forward lean/weight shifting;Reaching for objects Extremity/Trunk Assessment RUE Assessment RUE Assessment: Within Functional Limits LUE Assessment LUE Assessment: Within Functional Limits   Tonny [place] 09/25/2019, 7:00 AM

## 2019-09-25 NOTE — Plan of Care (Signed)
  Problem: Consults Goal: RH GENERAL PATIENT EDUCATION Description: See Patient Education module for education specifics. Outcome: Progressing Goal: Skin Care Protocol Initiated - if Braden Score 18 or less Description: If consults are not indicated, leave blank or document N/A Outcome: Progressing Goal: Nutrition Consult-if indicated Outcome: Progressing Goal: Diabetes Guidelines if Diabetic/Glucose > 140 Description: If diabetic or lab glucose is > 140 mg/dl - Initiate Diabetes/Hyperglycemia Guidelines & Document Interventions  Outcome: Progressing   Problem: RH BOWEL ELIMINATION Goal: RH STG MANAGE BOWEL WITH ASSISTANCE Description: STG Manage Bowel with Mod I Assistance. Outcome: Progressing Goal: RH STG MANAGE BOWEL W/MEDICATION W/ASSISTANCE Description: STG Manage Bowel with Medication with Mod I Assistance. Outcome: Progressing   Problem: RH BLADDER ELIMINATION Goal: RH STG MANAGE BLADDER WITH ASSISTANCE Description: STG Manage Bladder With Mod I Assistance Outcome: Progressing Goal: RH STG MANAGE BLADDER WITH MEDICATION WITH ASSISTANCE Description: STG Manage Bladder With Medication With Mod I Assistance. Outcome: Progressing Goal: RH STG MANAGE BLADDER WITH EQUIPMENT WITH ASSISTANCE Description: STG Manage Bladder With Equipment With Mod I Assistance Outcome: Progressing   Problem: RH SKIN INTEGRITY Goal: RH STG SKIN FREE OF INFECTION/BREAKDOWN Description: Skin to remain free from infection and breakdown while on CIR with Mod I assistance. Outcome: Progressing Goal: RH STG MAINTAIN SKIN INTEGRITY WITH ASSISTANCE Description: STG Maintain Skin Integrity With Mod I Assistance. Outcome: Progressing Goal: RH STG ABLE TO PERFORM INCISION/WOUND CARE W/ASSISTANCE Description: STG Able To Perform Incision/Wound Care With Mod I Assistance. Outcome: Progressing   Problem: RH SAFETY Goal: RH STG ADHERE TO SAFETY PRECAUTIONS W/ASSISTANCE/DEVICE Description: STG Adhere to  Safety Precautions With Mod I Assistance and appropriate assistive Device. Outcome: Progressing Goal: RH STG DECREASED RISK OF FALL WITH ASSISTANCE Description: STG Decreased Risk of Fall With Mod I Assistance. Outcome: Progressing   Problem: RH PAIN MANAGEMENT Goal: RH STG PAIN MANAGED AT OR BELOW PT'S PAIN GOAL Description: <4 on a 0-10 pain scale Outcome: Progressing   Problem: RH KNOWLEDGE DEFICIT GENERAL Goal: RH STG INCREASE KNOWLEDGE OF SELF CARE AFTER HOSPITALIZATION Description: Patient and caregiver will demonstrate knowledge on medication management, diabetes management, respiratory management, trach care management, follow up medical care management, wound care management with Mod I assistance from CIR staff at discharge. Outcome: Progressing

## 2019-09-25 NOTE — Progress Notes (Signed)
Pharmacy Antibiotic Note  Timothy Le is a 59 y.o. male admitted on 09/15/2019 .   Dysuria: Foley discontinued 8/30.  Fever, Tc 100.4,  TM 101.3.   WBC  10K.  Pharmacy has been consulted for  Ceftazidime dosing for pseudomonas UTI.   8/28 Urine Cx: 50 K  pseudomonas >  Intermediate sensitivity to cipro, sens to ceftazidime, imipenem, gentamicin 8/31 Blood Cx x2:  No growth to date x 3d 9/1 Sputum Cx:  PSEUDOMONAS AERUGINOSA, sens to ceftazidime  Tc 99.4,   Tmax 100.4 (9/2 pm),  WBC 6>>>13.6>9.5 wnl (9/2) SCr 1.08 stable. 9/3: Sputum trach aspirate culture grew pseudomonas so far, sensitivities pending.  Dr. Naaman Plummer asked this pharmacist about addnl coverage or change in antibiotic, I have discussed case with Nicoletta Dress, PharmD with Infectious Disease who recommended to continue [facility] for now while we wait for sputum susceptibilities. The carbapenem MIC is borderline just like the ceftazidime so would not recommend a change with just low grade fevers, could just be part of his disease course.    9/6: Sputum trach aspirate culture now resulted - sensitive to ceftazidime. Patient is still afebrile, WBC wnl, improving clinically. Scr is stable. Per MD, continuing ceftazidime until discharge with plan to send patient home on additional 5 days of Levaquin 500mg  daily.   Plan: Continue Ceftazidime 2 g IV q8 hours. Monitor clinical status, renal function.     Height: 6' (182.9 cm) Weight: (!) 141.8 kg (312 lb 9.6 oz) IBW/kg (Calculated) : 77.6  Temp (24hrs), Avg:98.6 F (37 C), Min:97.9 F (36.6 C), Max:99.2 F (37.3 C)  Recent Labs  Lab 09/20/19 0158 09/21/19 0431 09/22/19 1915 09/25/19 0402  WBC 13.6* 9.5 8.6 7.0  CREATININE 1.08  --   --  1.06    Estimated Creatinine Clearance: 111 mL/min (by C-G formula based on SCr of 1.06 mg/dL).    No Known Allergies   Rebbeca Paul, PharmD PGY1 Pharmacy Resident 09/25/2019 1:39 PM  Please check [email or web address] for unit-specific  pharmacy phone numbers.

## 2019-09-25 NOTE — Progress Notes (Signed)
Physical Therapy Discharge Summary  Patient Details  Name: Timothy Le MRN: [id number] Date of Birth: [birth year]  Today's Date: 09/25/2019 PT Individual Time: 0800-0915 PT Individual Time Calculation (min): 75 min    Patient has met 8 of 8 long term goals due to improved activity tolerance, improved balance, improved postural control, increased strength, decreased pain and ability to compensate for deficits.  Patient to discharge at an ambulatory level Modified Independent.   Patient's care partner is independent to provide the necessary physical assistance at discharge. Patient's niece will be helping with IADLs and checking in on patient daily at d/c. She had done this PTA.  Reasons goals not met: n/a  Recommendation:  Patient will benefit from ongoing skilled PT services in home health setting to continue to advance safe functional mobility, address ongoing impairments in activity tolerance, functional mobility, gait and stair training, strength, balance, patient/caregiver education, and minimize fall risk.  Equipment: No equipment provided, patient has bariatric RW from previous admission  Reasons for discharge: treatment goals met  Patient/family agrees with progress made and goals achieved: Yes  Skilled Therapeutic Intervention: Patient sitting EOB upon PT arrival. Patient alert and agreeable to PT session. Patient denied pain during session. Donned B TED hose and tennis shoes for reduced foot pain when ambulating with max A for energy conservation and time management at beginning of session.   Therapeutic Activity: Bed Mobility: Patient performed rolling R/L and supine to/from sit independently in the ADL bed. . Transfers: Patient performed toilet transfer with BSC over toilet with mod I using RW. Performed peri-care and LB dressing independently. He performed sit to/from stand from the w/c, mat table, and ADL couch with mod I using the RW. Noted increased effort from lower  surfaces and educated on elevating low surfaces with a cushion for safe transfers, patient stated understanding.  When asked what he would do if he fell at home, patient responded that he would "try to get up." Educated patient signs and symptoms from a fall that would indicate immediate medical attention and not to get up form the floor. Then patient performed a floor transfer onto a floor mat to/from mat table. Required min A due to decreased LE strength and activity tolerane. Noted SOB upon getting to the floor and returning to sitting on the mat table from increased effort with mobility. Educated on risk of increased injury if he were to attempt to get up from the floor on his own at this time. Educated on having his cell phone in his pocket at all times to allow him to call emergency services in the event of a fall. Patient in agreement and stated understanding.   Patient performed standing balance as he made himself a cup of coffee in the family room. Noted minor posterior sway without UE support. Educated patient on holding on with one hand at all times during activities for improved balance/stability in standing. With single UE support patient was mod I with balance throughout the task. Patient stood >2 min x2 while making his coffee. Provided walker bag to make tasks requiring carrying objects easier and safer at home.   Gait Training:  Patient ambulated to/from the bathroom with mod I using baratric RW. He also ambulated 140 feet over unlevel surfaces outside and up/down a curb using a bariatric RW with supervision for safety due to decreased balance and activity tolerance. Ambulated with decreased gait speed, decreased step length and height, increased lateral sway, forward trunk lean, and downward head gaze.  Provided verbal cues for paced breathing.   Wheelchair Mobility:  Patient was transported in the w/c with total A throughout session for energy conservation and time management.  Patient  in w/c in the room at end of session with breaks locked, chair alarm set, and all needs within reach.   PT Discharge Precautions/Restrictions Precautions Precautions: Fall Restrictions Weight Bearing Restrictions: No Vision/Perception  Perception Perception: Within Functional Limits Praxis Praxis: Intact  Cognition Overall Cognitive Status: Within Functional Limits for tasks assessed Arousal/Alertness: Awake/alert Memory: Appears intact Awareness: Appears intact Problem Solving: Appears intact Safety/Judgment: Appears intact Sensation Sensation Light Touch: Impaired Detail Peripheral sensation comments: hx of peripheral neuopathy in B feet Light Touch Impaired Details: Impaired RLE;Impaired LLE (intermittent decreased light touch with tingling) Proprioception: Appears Intact Coordination Heel Shin Test: [facility] Motor  Motor Motor: Abnormal postural alignment and control Motor - Discharge Observations: Continued generalized deconditioning with improvement during admission  Mobility Bed Mobility Bed Mobility: Rolling Right;Rolling Left;Supine to Sit;Sit to Supine Rolling Right: Independent Rolling Left: Independent Supine to Sit: Independent with assistive device (HOB elevated to simulate home set up) Sit to Supine: Independent with assistive device (HOB elevated to simulate home set up) Transfers Transfers: Sit to Stand;Stand to Sit;Stand Pivot Transfers Sit to Stand: Independent with assistive device Stand to Sit: Independent with assistive device Stand Pivot Transfers: Independent with assistive device Transfer (Assistive device): Rolling walker Locomotion  Gait Ambulation: Yes Gait Assistance: Independent with assistive device;Supervision/Verbal cueing (Mod I household distances, supervision for distances >50 ft due to decreased activity tolerance) Gait Distance (Feet): 226 Feet Assistive device: Rolling walker Gait Gait: Yes Gait Pattern: Step-through  pattern;Decreased hip/knee flexion - right;Decreased hip/knee flexion - left;Lateral hip instability;Trunk flexed;Wide base of support Gait velocity: decreased Stairs / Additional Locomotion Stairs: Yes Stairs Assistance: Supervision/Verbal cueing Stair Management Technique: With walker Height of Stairs: 7 Curb: Supervision/Verbal cueing Wheelchair Mobility 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, petroleum)  Trunk/Postural Assessment  Cervical Assessment Cervical Assessment: Exceptions to [facility] (forward head) Thoracic Assessment Thoracic Assessment: Exceptions to [facility] (rounded shoulders) Lumbar Assessment Lumbar Assessment: Exceptions to [facility] (posterior pelvic tilt) Postural Control Postural Control: Deficits on evaluation Righting Reactions: delayed Protective Responses: delayed  Balance Standardized Balance Assessment Standardized Balance Assessment: Berg Balance Test Berg Balance Test Sit to Stand: Able to stand  independently using hands Standing Unsupported: Able to stand safely 2 minutes Sitting with Back Unsupported but Feet Supported on Floor or Stool: Able to sit safely and securely 2 minutes Stand to Sit: Sits safely with minimal use of hands Transfers: Able to transfer safely, definite need of hands Standing Unsupported with Eyes Closed: Able to stand 10 seconds with supervision Standing Ubsupported with Feet Together: Needs help to attain position and unable to hold for 15 seconds From Standing, Reach Forward with Outstretched Arm: Reaches forward but needs supervision From Standing Position, Pick up Object from Floor: Able to pick up shoe, needs supervision From Standing Position, Turn to Look Behind Over each Shoulder: Looks behind one side only/other side shows less weight shift Turn 360 Degrees: Able to turn 360 degrees safely but slowly Standing Unsupported, Alternately Place Feet on Step/Stool: Needs assistance to keep from falling or unable to try Standing  Unsupported, One Foot in Front: Loses balance while stepping or standing Standing on One Leg: Unable to try or needs assist to prevent fall Total Score: 30 Static Sitting Balance Static Sitting - Balance Support: No upper extremity supported;Feet unsupported Static Sitting - Level of Assistance: 7: Independent  Dynamic Sitting Balance Dynamic Sitting - Balance Support: No upper extremity supported;Feet supported;During functional activity Dynamic Sitting - Level of Assistance: 7: Independent Dynamic Sitting - Balance Activities: Lateral lean/weight shifting;Forward lean/weight shifting;Reaching for objects Static Standing Balance Static Standing - Balance Support: Right upper extremity supported;Left upper extremity supported Static Standing - Level of Assistance: 6: Modified independent (Device/Increase time) Dynamic Standing Balance Dynamic Standing - Balance Support: Right upper extremity supported;Left upper extremity supported;During functional activity Dynamic Standing - Level of Assistance: 6: Modified independent (Device/Increase time) Dynamic Standing - Balance Activities: Lateral lean/weight shifting;Forward lean/weight shifting;Reaching for objects Extremity Assessment  RUE Assessment RUE Assessment: Within Functional Limits LUE Assessment LUE Assessment: Within Functional Limits RLE Assessment RLE Assessment: Exceptions to [facility] Active Range of Motion (AROM) Comments: WFL for functional mobility General Strength Comments: Grossly 4+-5/5 throughout, except DF 3/5 LLE Assessment LLE Assessment: Exceptions to [facility] Active Range of Motion (AROM) Comments: WFL for functional mobility General Strength Comments: Grossly 4+-5/5 throughout, except DF 3/5     L  PT, DPT  09/25/2019, 12:27 PM

## 2019-09-25 NOTE — Progress Notes (Signed)
Patient ID: Timothy Le, male   DOB: [birth year], 59 y.o.   MRN: [id number]  SW met with pt to discuss who will pick him up tomorrow. Pt states his niece will pick him up and she has to be to work by [employer]. SW requested from medical team, pt to be ready by 10am tomorrow for d/c.   [name], MSW, [facility] Office: [phone] Cell: [phone] Fax: [phone]

## 2019-09-25 NOTE — Progress Notes (Signed)
[place] PHYSICAL MEDICINE & REHABILITATION PROGRESS NOTE   Subjective/Complaints:  No new issues. Denies fever, chills this weekend.cough better. Tells me he knows nothing new about his apartment  ROS: Patient denies fever, rash, sore throat, blurred vision, nausea, vomiting, diarrhea, cough, shortness of breath or chest pain, joint or back pain, headache, or mood change.    Objective:   No results found. Recent Labs    09/22/19 1915 09/25/19 0402  WBC 8.6 7.0  HGB 8.9* 9.4*  HCT 29.4* 30.4*  PLT 256 266   Recent Labs    09/25/19 0402  NA 138  K 3.7  CL 101  CO2 29  GLUCOSE 123*  BUN 20  CREATININE 1.06  CALCIUM 8.3*    Intake/Output Summary (Last 24 hours) at 09/25/2019 0941 Last data filed at 09/25/2019 0716 Gross per 24 hour  Intake 842 ml  Output 1350 ml  Net -508 ml     Physical Exam: Vital Signs Blood pressure 109/61, pulse 69, temperature 97.9 F (36.6 C), resp. rate 19, height 6' (1.829 m), weight (!) 141.8 kg, SpO2 97 %. Constitutional: No distress . Vital signs reviewed. HEENT: EOMI, oral membranes moist Neck: trach in place with PMV. Minimal secretions Cardiovascular: RRR without murmur. No JVD    Respiratory/Chest: CTA Bilaterally without wheezes or rales. Normal effort    GI/Abdomen: BS +, non-tender, non-distended Ext: no clubbing, cyanosis, or edema Psych: pleasant and cooperative Extremities: Left knee sl tender Skin: Stage 2 pressure injury to buttocks with foam dressing Nails long, fungal changes in all toes-no changes Neuro: Ox3 Cranial nerves 2-12 are intact. Fine motor coordination is intact. No tremors.5/5 strength throughout. Decreased LT in feet remains    Assessment/Plan: 1. Functional deficits secondary to debility which require 3+ hours per day of interdisciplinary therapy in a comprehensive inpatient rehab setting.  Physiatrist is providing close team supervision and 24 hour management of active medical problems listed  below.  Physiatrist and rehab team continue to assess barriers to discharge/monitor patient progress toward functional and medical goals  Care Tool:  Bathing    Body parts bathed by patient: Right arm, Left arm, Chest, Abdomen, Front perineal area, Buttocks, Right upper leg, Left upper leg, Face, Left lower leg, Right lower leg     Body parts n/a: Right lower leg, Left lower leg   Bathing assist Assist Level: Contact Guard/Touching assist     Upper Body Dressing/Undressing Upper body dressing   What is the patient wearing?: Pull over shirt    Upper body assist Assist Level: Set up assist    Lower Body Dressing/Undressing Lower body dressing      What is the patient wearing?: Underwear/pull up, Pants     Lower body assist Assist for lower body dressing: Supervision/Verbal cueing     Toileting Toileting    Toileting assist Assist for toileting: Contact Guard/Touching assist Assistive Device Comment: urinal   Transfers Chair/bed transfer  Transfers assist     Chair/bed transfer assist level: Supervision/Verbal cueing Chair/bed transfer assistive device: Programmer, multimedia   Ambulation assist   Ambulation activity did not occur: Safety/medical concerns  Assist level: Supervision/Verbal cueing Assistive device: Walker-rolling Max distance: 226 ft   Walk 10 feet activity   Assist  Walk 10 feet activity did not occur: Safety/medical concerns  Assist level: Independent with assistive device Assistive device: Walker-rolling   Walk 50 feet activity   Assist Walk 50 feet with 2 turns activity did not occur: Safety/medical concerns  Assist level: Supervision/Verbal cueing Assistive device: Walker-rolling    Walk 150 feet activity   Assist Walk 150 feet activity did not occur: Safety/medical concerns  Assist level: Supervision/Verbal cueing Assistive device: Walker-rolling    Walk 10 feet on uneven surface  activity   Assist  Walk 10 feet on uneven surfaces activity did not occur: Safety/medical concerns   Assist level: Supervision/Verbal cueing Assistive device: Aeronautical engineer Will patient use wheelchair at discharge?: No (No long-term goals per PT) Type of Wheelchair: Manual Wheelchair activity did not occur: Safety/medical concerns  Wheelchair assist level: Supervision/Verbal cueing Max wheelchair distance: 25 ft    Wheelchair 50 feet with 2 turns activity    Assist    Wheelchair 50 feet with 2 turns activity did not occur: Safety/medical concerns       Wheelchair 150 feet activity     Assist  Wheelchair 150 feet activity did not occur: Safety/medical concerns       Blood pressure 109/61, pulse 69, temperature 97.9 F (36.6 C), resp. rate 19, height 6' (1.829 m), weight (!) 141.8 kg, SpO2 97 %.  Medical Problem List and Plan: 1.  Impaired mobility and ADLs secondary to cardiac and pulmonary debility             -patient may sponge bathe             -ELOS/Goals: 9/7 potentially  Continue CIR 2.  Antithrombotics: -DVT/anticoagulation:  Pharmaceutical: Lovenox             -antiplatelet therapy: N/A 3. Pain Management: Gabapentin 200mg  TID effective for neuropathy. Was on oxycodone 10mg  q6H PRN (using sparingly- decreased to 5mg  q6H PRN). Has PRN tylenol.         Left knee effusion: ace bandage, taping, ice TID, voltaren gel.    improved  -still c/o feet/toes bothering him  9/6 will need to have toes addressed as outpt by podiatry 4. Mood: On Seroquel 100mg  BID and Zoloft 100mg  daily.   -mood very positive  5. Neuropsych: This patient is capable of making decisions on his own behalf. 6. Skin/Wound Care: Routine tracheostomy care. Continue Juven bid with multivitamin.   -will need to have toes cut after discharge.    7. Fluids/Electrolytes/Nutrition:  9/6 continue potassium supplementation 8.. T2DM:  On Lantus 30 with novolog 8-10 units tid with  meals. Intake has been good--continue to monitor BS ac/hs. changed levemir to lantus bid. Added CM restrictions to diet.   9/6 controlled 9. COPD: Used MDI with nebs prn wheezing PTA. Has not had to use oxygen for past couple of years. Resumed  MDI.   -Continue hypertonic saline nebs.  10. Chronic CHF: Monitor for signs of overload and check weights daily. Continue lopressor bid with demadex   -9/3 still not checking daily weights!!!  9/4- has order for daily weights as of 9/2- don't have weight today- will check with nursing  9/5- no weight in computer- likely an issue with bed- will check again.  Filed Weights   09/20/19 1321  Weight: (!) 141.8 kg    11. Dysuria:    8/30, ucx with 50k pseudomonas   -changed to [place] after discussion with ID  8/31 12. Anxiety: Managed by Zoloft and Seroquel bid.  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 Discussed with therapists. On room air satting well  -continue local trach are  -PMV 14. Fever:     9/6 now afebrile, wbc's down to 7  -  improved secretions  -urine and sputum with pseudomonas S to fortaz  -continue fortaz until discharge  -send home on 5 addnl days of levaquin 500mg  daily LOS: 10 days A FACE TO FACE EVALUATION WAS PERFORMED  Meredith Staggers 09/25/2019, 9:41 AM

## 2019-09-26 DIAGNOSIS — N390 Urinary tract infection, site not specified: Secondary | ICD-10-CM

## 2019-09-26 DIAGNOSIS — I5032 Chronic diastolic (congestive) heart failure: Secondary | ICD-10-CM

## 2019-09-26 DIAGNOSIS — Z930 Tracheostomy status: Secondary | ICD-10-CM

## 2019-09-26 DIAGNOSIS — J40 Bronchitis, not specified as acute or chronic: Secondary | ICD-10-CM

## 2019-09-26 LAB — GLUCOSE, CAPILLARY: Glucose-Capillary: 107 mg/dL — ABNORMAL HIGH (ref 70–99)

## 2019-09-26 MED ORDER — OXYCODONE HCL 10 MG PO TABS
5.0000 mg | ORAL_TABLET | Freq: Every day | ORAL | 0 refills | Status: DC | PRN
Start: 2019-09-26 — End: 2019-09-26

## 2019-09-26 MED ORDER — LEVOFLOXACIN 500 MG PO TABS
500.0000 mg | ORAL_TABLET | Freq: Every day | ORAL | Status: DC
  Administered 2019-09-26: 500 mg via ORAL
  Filled 2019-09-26: qty 1

## 2019-09-26 MED ORDER — TRAZODONE HCL 50 MG PO TABS: 25.0000 mg | ORAL_TABLET | Freq: Every evening | ORAL | 0 refills | Status: DC | PRN

## 2019-09-26 MED ORDER — MELATONIN 3 MG PO TABS: 3.0000 mg | ORAL_TABLET | Freq: Every day | ORAL | 0 refills | Status: DC

## 2019-09-26 MED ORDER — TORSEMIDE 10 MG PO TABS: 10.0000 mg | ORAL_TABLET | Freq: Every day | ORAL | 0 refills | Status: DC

## 2019-09-26 MED ORDER — POTASSIUM CHLORIDE CRYS ER 20 MEQ PO TBCR: 20.0000 meq | EXTENDED_RELEASE_TABLET | Freq: Two times a day (BID) | ORAL | 0 refills | Status: DC

## 2019-09-26 MED ORDER — ACETAMINOPHEN 325 MG PO TABS: 325.0000 mg | ORAL_TABLET | ORAL | Status: DC | PRN

## 2019-09-26 MED ORDER — MOMETASONE FURO-FORMOTEROL FUM 100-5 MCG/ACT IN AERO: 2.0000 | INHALATION_SPRAY | Freq: Two times a day (BID) | RESPIRATORY_TRACT | 0 refills | Status: DC

## 2019-09-26 MED ORDER — OXYCODONE HCL 10 MG PO TABS
5.0000 mg | ORAL_TABLET | Freq: Every day | ORAL | 0 refills | Status: DC | PRN
Start: 2019-09-26 — End: 2020-04-11

## 2019-09-26 MED ORDER — METOPROLOL TARTRATE 25 MG PO TABS: 12.5000 mg | ORAL_TABLET | Freq: Two times a day (BID) | ORAL | 0 refills | Status: DC

## 2019-09-26 MED ORDER — LEVOFLOXACIN 500 MG PO TABS: 500.0000 mg | ORAL_TABLET | Freq: Every day | ORAL | 0 refills | Status: DC

## 2019-09-26 MED ORDER — GABAPENTIN 100 MG PO CAPS: 200.0000 mg | ORAL_CAPSULE | Freq: Three times a day (TID) | ORAL | 0 refills | Status: DC

## 2019-09-26 MED ORDER — PANTOPRAZOLE SODIUM 40 MG PO TBEC: 40.0000 mg | DELAYED_RELEASE_TABLET | Freq: Every day | ORAL | 0 refills | Status: DC

## 2019-09-26 MED ORDER — ALLOPURINOL 100 MG PO TABS: 100.0000 mg | ORAL_TABLET | Freq: Every day | ORAL | 0 refills | Status: DC

## 2019-09-26 MED ORDER — JUVEN PO PACK: 1.0000 | PACK | Freq: Two times a day (BID) | ORAL | 0 refills | Status: DC

## 2019-09-26 MED ORDER — DICLOFENAC SODIUM 1 % EX GEL: 2.0000 g | Freq: Four times a day (QID) | CUTANEOUS | 0 refills | Status: DC

## 2019-09-26 MED ORDER — QUETIAPINE FUMARATE 100 MG PO TABS: 100.0000 mg | ORAL_TABLET | Freq: Two times a day (BID) | ORAL | 0 refills | Status: DC

## 2019-09-26 MED ORDER — SERTRALINE HCL 100 MG PO TABS: 100.0000 mg | ORAL_TABLET | Freq: Every day | ORAL | 0 refills | Status: DC

## 2019-09-26 NOTE — Progress Notes (Signed)
PDMP reviewed --no medications noted for the past year. Oxycodone 10 mg - #7 pills Rx. Marland Kitchen

## 2019-09-26 NOTE — Progress Notes (Signed)
Inpatient Rehabilitation Care Coordinator  Discharge Note  The overall goal for the admission was met for:   Discharge location: Yes. D/c to home with intermittent support from his sister and niece.   Length of Stay: Yes. 10 days.   Discharge activity level: Yes. Mod I  Home/community participation: Yes. Limited.   Services provided included: MD, RD, PT, OT, SLP, RN, CM, TR, Pharmacy, Neuropsych and SW  Financial Services: Medicaid  Follow-up services arranged: Home Health: [facility] for PT/OT/SLP/SN (trach care edu) and DME: [facility] for portable suction, trach care kits/supplies, and humidification, and TTB to be shipped to home  1) PCS referral submitted to [employer] [phone]). Initially approved for 60 hours until a nursing assessment is completed. Process can take up to 30 days to initiate services. Referral will be sent to the following agencies: 1) [employer], 2) [facility], 3) [facility].  2) CAP/DA referral submitted with [facility] CAP/DA program [phone]). [place] to mail to home within 7-10 business days.  Comments (or additional information): contact pt [phone]  Patient/Family verbalized understanding of follow-up arrangements: Yes  Individual responsible for coordination of the follow-up plan: Pt to have assistance with coordinating his care needs.   Confirmed correct DME delivered: Rana Snare 09/26/2019    Rana Snare

## 2019-09-26 NOTE — Progress Notes (Signed)
[facility] PHYSICAL MEDICINE & REHABILITATION PROGRESS NOTE   Subjective/Complaints:  No complaints. Very excited to get home!  ROS: Patient denies fever, rash, sore throat, blurred vision, nausea, vomiting, diarrhea,   shortness of breath or chest pain, joint or back pain, headache, or mood change.   Objective:   No results found. Recent Labs    09/25/19 0402  WBC 7.0  HGB 9.4*  HCT 30.4*  PLT 266   Recent Labs    09/25/19 0402  NA 138  K 3.7  CL 101  CO2 29  GLUCOSE 123*  BUN 20  CREATININE 1.06  CALCIUM 8.3*    Intake/Output Summary (Last 24 hours) at 09/26/2019 0921 Last data filed at 09/26/2019 0645 Gross per 24 hour  Intake 1050 ml  Output 2325 ml  Net -1275 ml     Physical Exam: Vital Signs Blood pressure 124/74, pulse 70, temperature 98.1 F (36.7 C), resp. rate 18, height 6' (1.829 m), weight (!) 140.4 kg, SpO2 98 %. Constitutional: No distress . Vital signs reviewed. HEENT: EOMI, oral membranes moist Neck: supple with trach Cardiovascular: RRR without murmur. No JVD    Respiratory/Chest: CTA Bilaterally without wheezes or rales. Normal effort    GI/Abdomen: BS +, non-tender, non-distended Ext: no clubbing, cyanosis, or edema Psych: pleasant and cooperative Extremities: Left knee sl tender Skin: Stage 2 pressure injury to buttocks with foam dressing Nails long, fungal changes in all toes-no changes Neuro: Ox3 Cranial nerves 2-12 are intact. Fine motor coordination is intact. No tremors.5/5 strength throughout. Decreased LT in feet remains    Assessment/Plan: 1. Functional deficits secondary to debility which require 3+ hours per day of interdisciplinary therapy in a comprehensive inpatient rehab setting.  Physiatrist is providing close team supervision and 24 hour management of active medical problems listed below.  Physiatrist and rehab team continue to assess barriers to discharge/monitor patient progress toward functional and medical  goals  Care Tool:  Bathing    Body parts bathed by patient: Right arm, Left arm, Chest, Abdomen, Front perineal area, Buttocks, Right upper leg, Left upper leg, Face, Left lower leg, Right lower leg     Body parts n/a: Right lower leg, Left lower leg   Bathing assist Assist Level: Independent with assistive device     Upper Body Dressing/Undressing Upper body dressing   What is the patient wearing?: Pull over shirt    Upper body assist Assist Level: Independent with assistive device    Lower Body Dressing/Undressing Lower body dressing      What is the patient wearing?: Underwear/pull up, Pants     Lower body assist Assist for lower body dressing: Independent with assitive device     Toileting Toileting    Toileting assist Assist for toileting: Independent with assistive device Assistive Device Comment: urinal   Transfers Chair/bed transfer  Transfers assist     Chair/bed transfer assist level: Independent with assistive device Chair/bed transfer assistive device: Programmer, multimedia   Ambulation assist   Ambulation activity did not occur: Safety/medical concerns  Assist level: Supervision/Verbal cueing Assistive device: Walker-rolling Max distance: 170'   Walk 10 feet activity   Assist  Walk 10 feet activity did not occur: Safety/medical concerns  Assist level: Independent with assistive device Assistive device: Walker-rolling   Walk 50 feet activity   Assist Walk 50 feet with 2 turns activity did not occur: Safety/medical concerns  Assist level: Independent with assistive device Assistive device: Walker-rolling    Walk 150 feet  activity   Assist Walk 150 feet activity did not occur: Safety/medical concerns  Assist level: Supervision/Verbal cueing Assistive device: Walker-rolling    Walk 10 feet on uneven surface  activity   Assist Walk 10 feet on uneven surfaces activity did not occur: Safety/medical  concerns   Assist level: Supervision/Verbal cueing Assistive device: Aeronautical engineer Will patient use wheelchair at discharge?: No (Patient is a functional ambulator) Type of Wheelchair: Manual Wheelchair activity did not occur: N/A  Wheelchair assist level: Supervision/Verbal cueing Max wheelchair distance: 25 ft    Wheelchair 50 feet with 2 turns activity    Assist    Wheelchair 50 feet with 2 turns activity did not occur: N/A       Wheelchair 150 feet activity     Assist  Wheelchair 150 feet activity did not occur: N/A       Blood pressure 124/74, pulse 70, temperature 98.1 F (36.7 C), resp. rate 18, height 6' (1.829 m), weight (!) 140.4 kg, SpO2 98 %.  Medical Problem List and Plan: 1.  Impaired mobility and ADLs secondary to cardiac and pulmonary debility             -patient may sponge bathe             -dc home today  -Patient to see me in the office for transitional care encounter in 1-2 weeks.     2.  Antithrombotics: -DVT/anticoagulation:  Pharmaceutical: Lovenox             -antiplatelet therapy: N/A 3. Pain Management: Gabapentin 200mg  TID effective for neuropathy. Was on oxycodone 10mg  q6H PRN (using sparingly- decreased to 5mg  q6H PRN). Has PRN tylenol.         Left knee effusion: ace bandage, taping, ice TID, voltaren gel.    improved    4. Mood: On Seroquel 100mg  BID and Zoloft 100mg  daily.   -mood very positive  5. Neuropsych: This patient is capable of making decisions on his own behalf. 6. Skin/Wound Care: Routine tracheostomy care. Continue Juven bid with multivitamin.   -will need to have toes cut after discharge with podiatry.    7. Fluids/Electrolytes/Nutrition:  9/6 continue potassium supplementation 8.. T2DM:  On Lantus 30 with novolog 8-10 units tid with meals. Intake has been good--continue to monitor BS ac/hs. changed levemir to lantus bid. Added CM restrictions to diet.   9/7 controlled 9. COPD:  Used MDI with nebs prn wheezing PTA. Has not had to use oxygen for past couple of years. Resumed  MDI.   -Continue hypertonic saline nebs.  10. Chronic CHF: Monitor for signs of overload and check weights daily. Continue lopressor bid with demadex   - 9/7- weights stable  Filed Weights   09/20/19 1321 09/26/19 0650  Weight: (!) 141.8 kg (!) 140.4 kg    11. Dysuria:    9/7, ucx with 50k pseudomonas s/p fortaz   -5 days of levaquin 12. Anxiety: Managed by Zoloft and Seroquel bid.  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 Discussed with therapists. On room air satting well  -continue local trach are  -PMV 14. Fever:  D/t pseudomonas tracheobronchitis/UTI   9/7 now afebrile, wbc's down to 7  -improved secretions  -urine and sputum with pseudomonas S to fortaz  -dc fortaz today  -send home on 5 addnl days of levaquin 500mg  daily LOS: 11 days A FACE TO FACE EVALUATION WAS PERFORMED  Meredith Staggers 09/26/2019, 9:21  AM    

## 2019-09-26 NOTE — Discharge Instructions (Signed)
Inpatient Rehab Discharge Instructions  Timothy Le Discharge date and time: 09/26/19   Activities/Precautions/ Functional Status: Activity: no lifting, driving, or strenuous exercise till cleared by MD Diet: regular diet Wound Care: keep wound clean and dry   Functional status:  ___ No restrictions     ___ Walk up steps independently _x__ 24/7 supervision/assistance   ___ Walk up steps with assistance ___ Intermittent supervision/assistance  ___ Bathe/dress independently ___ Walk with walker     ___ Bathe/dress with assistance ___ Walk Independently    ___ Shower independently ___ Walk with assistance    ___ Shower with assistance _X__ No alcohol     ___ Return to work/school ________  Special Instructions: 1. Appointment with [name] on October 6th at 1 pm.   COMMUNITY REFERRALS UPON DISCHARGE:    Home Health:   PT     OT     ST      RN                   Agency: [facility] Phone: [phone] *Please expect start of care to be on Wednesday 09/27/2019. Please expect follow-up to schedule your home visit. If you have not received follow-up be sure to contact the branch directly.*  Medical Equipment/Items Ordered: trach care kits, supplies, portable suction, humidification, tub transfer bench                                                 Agency/Supplier: [facility] [phone]  GENERAL COMMUNITY RESOURCES FOR PATIENT/FAMILY: 1) PCS referral submitted to [employer] [phone]). Initially approved for 60 hours until a nursing assessment is completed. Process can take up to 30 days to initiate services. Referral will be sent to the following agencies: 1) [employer], 2) [facility], 3) [facility].  2) CAP/DA referral submitted with [facility] CAP/DA program [phone]). Please expect packet mailed from [facility] to home within 7-10 business days. Be sure to complete and mail in application to be  considered for services.     My questions have been answered and I understand these instructions. I will adhere to these goals and the provided educational materials after my discharge from the hospital.  Patient/Caregiver Signature _______________________________ Date __________  Clinician Signature _______________________________________ Date __________  Please bring this form and your medication list with you to all your follow-up doctor's appointments.

## 2019-09-26 NOTE — Progress Notes (Signed)
Patient anticipates discharge home today around 1 pm by niece, question if PICC will be discontinue, trach site capped no acute or noted distress, lungs sounds diminished, patient has a strong cough, tan /secretion noted, no acute distressed, Patient assist to BR, using his walker, tolerated well , continue to monitor and assist

## 2019-09-26 NOTE — Progress Notes (Signed)
Patient ID: Timothy Le, male   DOB: 07-18-1960, 59 y.o.   MRN: [id number]  SW spoke with [facility] to confirm that pt DME: portable suction will be delivered to the hospital prior to d/c. Pt has box of 5XL trach care kits and supplies until shipment arrives to the home. Pt to be set up for humidification at home. SW informed pt and assigned Therapist, sports.   [name], MSW, [place] Office: [phone] Cell: [phone] Fax: [phone]

## 2019-09-26 NOTE — Progress Notes (Signed)
Patient discharged home with family, all belongings sent with patient. No complications noted at this time. Mahiya Kercheval D Tawan Corkern, LPN 

## 2019-09-26 NOTE — Discharge Summary (Signed)
Physician Discharge Summary  Patient ID: Timothy Le MRN: [id number] DOB/AGE: 20-Mar-1960 59 y.o.  Admit date: 09/15/2019 Discharge date: 09/26/2019  Discharge Diagnoses:  Principal Problem:   Debility Active Problems:   COPD mixed type (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 EXAM: CHEST - 2 VIEW COMPARISON:  09/04/2019. FINDINGS: Tracheostomy tube in stable position. PICC line stable position. Interim removal of feeding tube. Stable cardiomegaly. No pulmonary venous congestion. Lung volumes with mild bibasilar atelectasis. Mild bilateral interstitial prominence. Mild pneumonitis cannot be excluded. No pleural effusion or pneumothorax. Costophrenic angles incompletely imaged. No acute bony abnormality. IMPRESSION: 1. Tracheostomy tube in stable position. PICC line stable position. Interim removal of feeding tube. 2.  Stable cardiomegaly.  No pulmonary venous congestion. 3. Low lung volumes with bibasilar atelectasis. Mild bilateral interstitial prominence. Mild pneumonitis cannot be excluded. Electronically Signed   By: Marcello Moores  Register   On: 09/19/2019 12:09      Labs:  Basic Metabolic Panel: BMP Latest Ref Rng & Units 09/25/2019 09/20/2019 09/18/2019  Glucose 70 - 99 mg/dL 123(H) 111(H) 137(H)  BUN 6 - 20 mg/dL 20 20 21(H)  Creatinine 0.61 - 1.24 mg/dL 1.06 1.08 1.11  Sodium 135 - 145 mmol/L 138 138 139  Potassium 3.5 - 5.1 mmol/L 3.7 3.4(L) 3.1(L)  Chloride 98 - 111 mmol/L 101 99 98  CO2 22 - 32 mmol/L 29 27 32  Calcium 8.9 - 10.3 mg/dL 8.3(L) 8.2(L) 8.7(L)    CBC: Recent Labs  Lab 09/21/19 0431 09/22/19 1915 09/25/19 0402  WBC 9.5 8.6  7.0  NEUTROABS  --  5.5  --   HGB 8.5* 8.9* 9.4*  HCT 27.9* 29.4* 30.4*  MCV 88.0 89.1 89.1  PLT 222 256 266    CBG: Recent Labs  Lab 09/25/19 0545 09/25/19 1140 09/25/19 1652 09/25/19 2052 09/26/19 0617  GLUCAP 107* 124* 128* 133* 107*    Brief HPI:   Timothy Le is a 59 y.o. male with history of T2DM, OSA, COPD, super morbid obesity-BMI 41.9, tracheal stenosis with prior tracheostomy who was admitted on 07/30/19 reports of his BiPAP not working for 2 weeks, acute on chronic respiratory failure and was unresponsive at admission requiring intubation.  Hospital course was significant for electrolyte abnormalities, hypoglycemia as well as inability to tolerate extubation requiring emergent tracheostomy by Dr. Grandville Silos.  ENT was consulted for input due to large thyroid goiter and felt that patient would need thyroidectomy prior to decannulation.  Trach did dislodge on 07/24 due to agitation requiring emergent intubation and he underwent tracheostomy by Dr. Constance Holster on 07/26.    He did have issues with recurrent mucous plugging and bradycardic arrest treated with ACLS protocol on 08/09. He underwent thyroidectomy, cervical lipectomy and tracheostomy revision on 08/11.  Postop had significant bleeding requiring 2 units PRBCs.  He tolerated extubation and was started on regular diet by 08/16.  Respiratory status was stable and chronic diastolic CHF compensated on Cardura and metaxalone.  Gabapentin was added to help manage neuropathic symptoms.  [name] was downsized to 5 XLT prior to discharge.  Therapy was ongoing and CIR was recommended due to debility.   Hospital Course: Timothy Le  was admitted to rehab 09/15/2019 for inpatient therapies to consist of PT  and OT at least three hours five days a week. Past admission physiatrist, therapy team and rehab RN have worked together to provide customized collaborative inpatient rehab. His foley d/c on 08/28 and urine culture done revealing 30,000  colonies of pseudomonas. He was started on cipro which was resistant and he had developed fever up to 101. on 08/30. Sputum culture also showed evidence of pseudomonas due to tracheobronchitis. BC X 2 were negative.  Pharmacy has assisted with input on antibiotic regimen and he was treated with 7 day course of ceftazidime which was transitioned to Levaquin for 5 additional days at discharged.  Leucocytosis and fevers have resolved with treatment of infection.   His respiratory status has been stable and cough has resolved. Serial check of CBC showed H/H is relatively stable. Check of lytes showed hypokalemia which has resolved with addition of supplement. His blood pressures were monitored on TID basis and have been stable. His diabetes has been monitored with ac/hs CBG checks and SSI was use prn for tighter BS control. He is modified independent and niece plans on checking in daily after discharge. He will continue to receive follow up [place], [place], [place] by [place] after discharge.    Rehab course: During patient's stay in rehab weekly team conferences were held to monitor patient's progress, set goals and discuss barriers to discharge. At admission, patient required min assist with mobility and ADL tasks. He has had improvement in activity tolerance, balance, postural control as well as ability to compensate for deficits. He is modified independent for transfers and for mobility.   Discharge disposition: 01-Home or Self Care  Diet: Heart Healthy/Carb Modified. Limit fluids to 2000cc/day  Special Instructions: 1. PMSV has to be off when napping and at nights.  2. Continue to use humidification to trach.  3. Monitor BS ac/hs and follow up with PCP for further input. Resume home SSI.  4. Repeat CBC in 1-2 weeks to monitor H/H.    Discharge Instructions    Ambulatory referral to Physical Medicine Rehab   Complete by: As directed    1-2 weeks TC appt     Allergies as of  09/26/2019   No Known Allergies     Medication List    STOP taking these medications   chlorhexidine gluconate (MEDLINE KIT) 0.12 % solution Commonly known as: PERIDEX   enoxaparin 80 MG/0.8ML injection Commonly known as: LOVENOX   insulin aspart 100 UNIT/ML injection Commonly known as: novoLOG   sodium chloride 0.9 % infusion   sodium chloride flush 0.9 % Soln Commonly known as: NS   sodium chloride HYPERTONIC 3 % nebulizer solution     TAKE these medications   acetaminophen 325 MG tablet Commonly known as: TYLENOL Take 1-2 tablets (325-650 mg total) by mouth every 4 (four) hours as needed for mild pain. What changed:   how much to take  when to take this  reasons to take this   allopurinol 100 MG tablet Commonly known as: ZYLOPRIM Take 1 tablet (100 mg total) by mouth daily.   alum & mag hydroxide-simeth 200-200-20 MG/5ML suspension Commonly known as: MAALOX/MYLANTA Take 30 mLs by mouth every 4 (four) hours as needed for indigestion, heartburn or flatulence.   atorvastatin 10 MG tablet Commonly known as: LIPITOR Take 10 mg by mouth daily.   dextromethorphan-guaiFENesin 30-600 MG 12hr tablet Commonly known as: MUCINEX DM Take 1 tablet by mouth  2 (two) times daily.   diclofenac Sodium 1 % Gel Commonly known as: VOLTAREN Apply 2 g topically 4 (four) times daily. Notes to patient: Apply to left knee    gabapentin 100 MG capsule Commonly known as: NEURONTIN Take 2 capsules (200 mg total) by mouth 3 (three) times daily.   insulin detemir 100 UNIT/ML injection Commonly known as: LEVEMIR Inject 0.33 mLs (33 Units total) into the skin every 12 (twelve) hours.   ipratropium-albuterol 0.5-2.5 (3) MG/3ML Soln Commonly known as: DUONEB Take 3 mLs by nebulization every 6 (six) hours as needed.   levofloxacin 500 MG tablet Commonly known as: LEVAQUIN Take 1 tablet (500 mg total) by mouth daily. Start taking on: September 27, 2019   melatonin 3 MG Tabs  tablet Take 1 tablet (3 mg total) by mouth at bedtime.   metoprolol tartrate 25 MG tablet Commonly known as: LOPRESSOR Take 0.5 tablets (12.5 mg total) by mouth 2 (two) times daily.   mometasone-formoterol 100-5 MCG/ACT Aero Commonly known as: DULERA Inhale 2 puffs into the lungs 2 (two) times daily.   multivitamin with minerals Tabs tablet Take 1 tablet by mouth daily.   nutrition supplement (JUVEN) Pack Take 1 packet by mouth 2 (two) times daily between meals. What changed: Another medication with the same name was removed. Continue taking this medication, and follow the directions you see here.   Oxycodone HCl 10 MG Tabs--Rx# 7 pills. Take 0.5 tablets (5 mg total) by mouth daily as needed (severe pain). What changed:   how much to take  when to take this  reasons to take this   pantoprazole 40 MG tablet Commonly known as: PROTONIX Take 1 tablet (40 mg total) by mouth at bedtime.   potassium chloride SA 20 MEQ tablet Commonly known as: KLOR-CON Take 1 tablet (20 mEq total) by mouth 2 (two) times daily.   QUEtiapine 100 MG tablet Commonly known as: SEROQUEL Take 1 tablet (100 mg total) by mouth 2 (two) times daily.   senna-docusate 8.6-50 MG tablet Commonly known as: Senokot-S Take 2 tablets by mouth 2 (two) times daily.   sertraline 100 MG tablet Commonly known as: ZOLOFT Take 1 tablet (100 mg total) by mouth daily.   torsemide 10 MG tablet Commonly known as: DEMADEX Take 1 tablet (10 mg total) by mouth daily.   traZODone 50 MG tablet Commonly known as: DESYREL Take 0.5-1 tablets (25-50 mg total) by mouth at bedtime as needed for sleep.       Follow-up Information    Meredith Staggers, MD Follow up.   Specialty: Physical Medicine and Rehabilitation Why: Office will call you with follow up appt Contact information: [address] [place] [address] [phone]        Deneise Lever, MD. Call in 1 day(s).   Specialty: Pulmonary  Disease Why: for follow up appointment in 1-2 weeks Contact information: [place] [address] [phone]        Kerin Perna, NP. Call in 1 day(s).   Specialty: Internal Medicine Why: for post hospital follow up Contact information: [address] [place] [address] [phone]        Izora Gala, MD. Call.   Specialty: Otolaryngology Why: for post op appointment after thyroidectomy/trach placement Contact information: [address] [place] [address] [phone]               Signed: Bary Leriche  09/26/2019, 5:01 PM

## 2019-09-27 ENCOUNTER — Telehealth (INDEPENDENT_AMBULATORY_CARE_PROVIDER_SITE_OTHER): Payer: Self-pay | Admitting: Primary Care

## 2019-09-27 ENCOUNTER — Encounter: Payer: Self-pay | Admitting: Podiatry

## 2019-09-27 ENCOUNTER — Other Ambulatory Visit: Payer: Self-pay

## 2019-09-27 ENCOUNTER — Ambulatory Visit: Payer: Medicaid Other | Admitting: Podiatry

## 2019-09-27 DIAGNOSIS — M79674 Pain in right toe(s): Secondary | ICD-10-CM | POA: Insufficient documentation

## 2019-09-27 DIAGNOSIS — Z794 Long term (current) use of insulin: Secondary | ICD-10-CM

## 2019-09-27 DIAGNOSIS — E1165 Type 2 diabetes mellitus with hyperglycemia: Secondary | ICD-10-CM

## 2019-09-27 DIAGNOSIS — M79675 Pain in left toe(s): Secondary | ICD-10-CM | POA: Diagnosis not present

## 2019-09-27 DIAGNOSIS — N179 Acute kidney failure, unspecified: Secondary | ICD-10-CM

## 2019-09-27 DIAGNOSIS — B351 Tinea unguium: Secondary | ICD-10-CM | POA: Diagnosis not present

## 2019-09-27 NOTE — Telephone Encounter (Signed)
Home Health Verbal Orders - Caller/Agency: [facility] Callback Number: [phone] [id number]  [id number] OT/PT/Skilled Nursing/Social Work/Speech Therapy: Nursing  Frequency: 1x for 9wks

## 2019-09-27 NOTE — Progress Notes (Signed)
This patient returns to my office for at risk foot care.  This patient requires this care by a professional since this patient will be at risk due to having diabetes  This patient is unable to cut nails himself since the patient cannot reach his nails.These nails are painful walking and wearing shoes.  This patient presents for at risk foot care today.  General Appearance  Alert, conversant and in no acute stress.  Vascular  Dorsalis pedis and posterior tibial  pulses are palpable  bilaterally.  Capillary return is within normal limits  bilaterally. Temperature is within normal limits  bilaterally.  Neurologic  Senn-Weinstein monofilament wire test within normal limits  bilaterally. Muscle power within normal limits bilaterally.  Nails Thick disfigured discolored nails with subungual debris  from hallux to fifth toes bilaterally. No evidence of bacterial infection or drainage bilaterally.  Orthopedic  No limitations of motion  feet .  No crepitus or effusions noted.  No bony pathology or digital deformities noted.  HAV  B/L.  Skin  normotropic skin with no porokeratosis noted bilaterally.  No signs of infections or ulcers noted.   Plantar callus along weight bearing areas both feet.  Onychomycosis  Pain in right toes  Pain in left toes  Consent was obtained for treatment procedures.   Mechanical debridement of nails 1-5  bilaterally performed with a nail nipper.  Filed with dremel without incident.    Return office visit      Prn.               Told patient to return for periodic foot care and evaluation due to potential at risk complications.   Gardiner Barefoot DPM

## 2019-09-28 ENCOUNTER — Telehealth: Payer: Self-pay

## 2019-09-28 NOTE — Telephone Encounter (Signed)
Transitional Care Call--who you spoke with   1. Are you/is patient experiencing any problems since coming home? Getting better. Now walking with a cane. Are there any questions regarding any aspect of care? No. I have friends helping out right now.  2. Are there any questions regarding medications administration/dosing? No. My nurse has organized my medication yesterday. Are meds being taken as prescribed?  Yes. Patient should review meds with caller to confirm. Reviewed. 3. Have there been any falls? No falls.  4. Has Home Health been to the house and/or have they contacted you? Yes.  If not, have you tried to contact them? NA. Can we help you contact them? [place] visited.  5. Are bowels and bladder emptying properly? Yes. Are there any unexpected incontinence issues?No. If applicable, is patient following bowel/bladder programs? No problem.  6. Any fevers, problems with breathing, unexpected pain? No.  7. Are there any skin problems or new areas of breakdown? No.  8. Has the patient/family member arranged specialty MD follow up (ie cardiology/neurology/renal/surgical/etc)? Not yet, but advised.   Can we help arrange? No help needed.  9. Does the patient need any other services or support that we can help arrange? Yes.  10. Are caregivers following through as expected in assisting the patient? Yes.        11. Has the patient quit smoking, drinking alcohol, or using drugs as recommended? No.   Courtesy call placed to Dr. Annamaria Boots office ph [phone] (pulmonary) for appointment move up. I spoke with Kenney Houseman who will call patient with a sooner appointment before November 2011. New PCP Dionisio Beldon appointment made on 10/10/2019 at the [facility] ph [phone]. Message left for Dan Maker with Dr. Constance Holster  [phone] (Otolaryngology), to call an schedule post op appointment.   Appointment Date/Time/ Arrival time/ and who they are seeing [place]

## 2019-09-28 NOTE — Telephone Encounter (Signed)
Not a patient of M. Edwards at [facility]. Please contact Amber at [facility] and have her contact the correct office for verbal orders.

## 2019-10-02 ENCOUNTER — Telehealth: Payer: Self-pay | Admitting: *Deleted

## 2019-10-02 NOTE — Telephone Encounter (Signed)
Timothy Le PT called for approval of POC 2wk2 1wk2.  Approval given.

## 2019-10-02 NOTE — Telephone Encounter (Signed)
Eliza ST called for POC 2 wk 4.  Approval given.

## 2019-10-03 ENCOUNTER — Telehealth: Payer: Self-pay

## 2019-10-03 NOTE — Telephone Encounter (Signed)
Verbal okay given to Timothy Le, OT home health visit BID for 4 weeks.

## 2019-10-04 ENCOUNTER — Other Ambulatory Visit: Payer: Self-pay

## 2019-10-04 ENCOUNTER — Ambulatory Visit (INDEPENDENT_AMBULATORY_CARE_PROVIDER_SITE_OTHER): Payer: Medicaid Other | Admitting: Internal Medicine

## 2019-10-04 ENCOUNTER — Encounter: Payer: Self-pay | Admitting: Internal Medicine

## 2019-10-04 VITALS — BP 118/80 | HR 92 | Temp 97.5°F | Ht 68.0 in | Wt 311.2 lb

## 2019-10-04 DIAGNOSIS — G4733 Obstructive sleep apnea (adult) (pediatric): Secondary | ICD-10-CM | POA: Diagnosis not present

## 2019-10-04 DIAGNOSIS — J9611 Chronic respiratory failure with hypoxia: Secondary | ICD-10-CM | POA: Diagnosis not present

## 2019-10-04 DIAGNOSIS — Z930 Tracheostomy status: Secondary | ICD-10-CM

## 2019-10-04 NOTE — Patient Instructions (Addendum)
Order- DME APS/ [employer] - please replace broken CPAP mask and leave patient your contact information.  When tracheostomy tube can be removed, he will resume BIPAP 23/18.  Keep appointment to see Marni Griffon at Tracheostomy clinic at 1:00 PM, October 6  Keep scheduled appointment to see [place] in Pulmonary again on November 5 at 10:30  I do strongly recommend you go ahead and get Covid vaccine  Please call if we can help

## 2019-10-04 NOTE — Assessment & Plan Note (Signed)
Room air arrival sat today 97%. He is prone to CO2 retention, so I will watch this and recheck in the future.

## 2019-10-04 NOTE — Progress Notes (Signed)
HPI  M former smoker followed for COPD, chronic hypoxic respiratory failure OSA, OHS, complicated by morbid obesity, DM2, CHF, anemia PFT 09/21/13-severe obstruction with slight response to bronchodilator, restriction of exhaled volume, mild reduction diffusion NPSG 08/08/13- AHI 21.1/ hr, desaturation to 80%, body weight 385 lbs BIPAP titration 09/05/15 optimal pressure 22/18, body weight 365 lbs PFT 10/11/2018- Severe obstruction with response to dilator. Severe restriction, diffusion moderately reduced. FVC 2.16/ 57%, FEV1 1.77/ 59%, Ratio 0.82, TLC 66%, DLCO 66% --------------------------------------------------------   05/24/19-  59 year old male former smoker followed for COPD, hypoxic respiratory failure/ tracheostomy, OSA, complicated by morbid obesity, OHS, DM2, CHF/ atherosclerosis, Goiter,  BIPAP 22/18/ APS/ [employer] Download compliance 100%, AHI 3.5/ hr Body weight today 335 lbs Symbicort 160- using as an occasional/ rescue inhaler,  He is very comfortable with his BIPAP and doesn't sleep without it.  No Covax yet- encouraged. Since last here, hosp for hyperglycemia. Now working with a nutritionist and weight is down almost 20 lbs. Walking a lot.   10/04/19- 59 year old male former smoker followed for COPD, hypoxic respiratory failure/ Tracheostomy/ Tracheal Stenosis, OSA, complicated by morbid obesity, OHS, DM2, CHF/ atherosclerosis, Goiter, COPD, Morbid Obesity,  BIPAP 22/18/ APS/ [employer] Download- unable to wear BIPAP now with tracheostomy Body weight today- 311 lbs Covid vax- Arrival sat 97% on room air [place] f/u- 8/27-09/26/19- Debility, BiPAP wasn't working for 2 weeks and he presented at ER unresponsive requiring intubation. Tracheostomy( 5 XLT), mucus plugging, bradycardic arrest/ resuscitation. Pseudomonas UTI/ sputum, TRx'd ceftazidime/ levaquin. Discharged w HHPT/ OT/ST and Nursing by [facility].  Evonnie Dawes speaking valve to be off with naps and nights.  Dulera 100, neb Doneb,    Melatonin, Gabapentin,  Niece checks on him daily. Ride brought him on the wrong day today so I worked him in. He says his BIPAP mask broke and he didn't have a contact number for [employer]. He didn't try to reach [place]. He feels "tired" with some raspy cough. Has O2 tank at home- not using.   His tracheostomy tie was filthy. He had a spare, so I changed it for him.  ECHO 07/31/19- EF 50-55%. No def [place] CXR 09/19/19- IMPRESSION: 1. Tracheostomy tube in stable position. PICC line stable position. Interim removal of feeding tube. 2.  Stable cardiomegaly.  No pulmonary venous congestion. 3. Low lung volumes with bibasilar atelectasis. Mild bilateral interstitial prominence. Mild pneumonitis cannot be excluded.  ROS-see HPI    + = positive Constitutional:    +weight loss, night sweats, fevers, chills, + fatigue, lassitude. HEENT:   No-  headaches, difficulty swallowing, tooth/dental problems, sore throat,      sneezing, itching, ear ache, nasal congestion, post nasal drip,  CV:  No-   chest pain, +orthopnea, PND, swelling in lower extremities, no-anasarca, dizziness, palpitations Resp: +shortness of breath with exertion or at rest.                productive cough,  + non-productive cough,  No- coughing up of blood.              No-   change in color of mucus.   wheezing.   Skin: No-   rash or lesions. GI:  No-   heartburn, indigestion, abdominal pain,no- nausea, vomiting,  GU:  MS:  + joint pain or swelling.  . Neuro-     nothing unusual Psych:  No- change in mood or affect. No depression or anxiety.  No memory loss.  OBJ- Physical Exam General- Alert, Oriented, Affect-appropriate, Distress-  none acute,  + morbidly obese,   . 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 Lymphadenopathy- none Head- atraumatic            Eyes- Gross vision intact, PERRLA, conjunctivae and secretions clear            Ears- Hearing, canals-normal            Nose- Clear, no-Septal dev,  mucus, polyps, erosion, perforation             Throat- Mallampati IV , mucosa clear , drainage- none, tonsils- atrophic,  Neck- + Breathing unlabored at rest on room air. +tracheostomy/ PMValve. Chest - symmetrical excursion , unlabored           Heart/CV- RRR, distant , no murmur , no gallop  , no rub, nl s1 s2                           - JVD- none , edema+ 1-2, stasis changes- none, varices- none           Lung- diminished/clear , wheeze- none, cough-none , dullness-none, rub- none                                   Chest wall-  Abd-  Br/ Gen/ Rectal- Not done, not indicated Extrem- cyanosis- none, clubbing, none, atrophy- none, strength- nl, + cane Neuro- grossly intact to observation

## 2019-10-04 NOTE — Assessment & Plan Note (Signed)
Goiter adds to pressure on upper airway. Hopefully he can become stable enough to consider surgical resection in the future to debulk.

## 2019-10-04 NOTE — Assessment & Plan Note (Signed)
Mixed OSA and OHS. He will need to restart BIPAP when he can be extubated. He will continue opening trach tube at night, on room air, and will f/u with [facility] clinic in October as planned.

## 2019-10-05 ENCOUNTER — Telehealth: Payer: Self-pay | Admitting: *Deleted

## 2019-10-05 ENCOUNTER — Ambulatory Visit: Payer: Medicaid Other | Admitting: Primary Care

## 2019-10-05 NOTE — Telephone Encounter (Signed)
Eliza ST with [facility] called to ask Dr Naaman Plummer if it will be ok for them to omit one of Timothy Le's visits this week because he doe not have insurance authorization.  Please advice.

## 2019-10-06 NOTE — Telephone Encounter (Signed)
Notified. 

## 2019-10-06 NOTE — Telephone Encounter (Signed)
That is ok for the time being.  thx

## 2019-10-10 ENCOUNTER — Encounter: Payer: Medicaid Other | Attending: Registered Nurse | Admitting: Registered Nurse

## 2019-10-11 ENCOUNTER — Encounter: Payer: Medicaid Other | Admitting: Registered Nurse

## 2019-10-12 ENCOUNTER — Encounter: Payer: Self-pay | Admitting: Nurse Practitioner

## 2019-10-12 ENCOUNTER — Ambulatory Visit (INDEPENDENT_AMBULATORY_CARE_PROVIDER_SITE_OTHER): Payer: Medicaid Other | Admitting: Nurse Practitioner

## 2019-10-12 ENCOUNTER — Other Ambulatory Visit: Payer: Self-pay

## 2019-10-12 VITALS — BP 114/52 | HR 84 | Temp 97.9°F | Ht 68.0 in | Wt 318.0 lb

## 2019-10-12 DIAGNOSIS — Z23 Encounter for immunization: Secondary | ICD-10-CM

## 2019-10-12 DIAGNOSIS — E785 Hyperlipidemia, unspecified: Secondary | ICD-10-CM

## 2019-10-12 DIAGNOSIS — Z930 Tracheostomy status: Secondary | ICD-10-CM

## 2019-10-12 DIAGNOSIS — I10 Essential (primary) hypertension: Secondary | ICD-10-CM

## 2019-10-12 DIAGNOSIS — E1100 Type 2 diabetes mellitus with hyperosmolarity without nonketotic hyperglycemic-hyperosmolar coma (NKHHC): Secondary | ICD-10-CM | POA: Diagnosis not present

## 2019-10-12 LAB — POCT URINALYSIS DIPSTICK
Bilirubin, UA: NEGATIVE
Blood, UA: NEGATIVE
Glucose, UA: NEGATIVE
Ketones, UA: NEGATIVE
Leukocytes, UA: NEGATIVE
Nitrite, UA: NEGATIVE
Protein, UA: NEGATIVE
Spec Grav, UA: 1.02 (ref 1.010–1.025)
Urobilinogen, UA: 0.2 E.U./dL
pH, UA: 5.5 (ref 5.0–8.0)

## 2019-10-12 LAB — POCT GLYCOSYLATED HEMOGLOBIN (HGB A1C): Hemoglobin A1C: 6.5 % — AB (ref 4.0–5.6)

## 2019-10-12 MED ORDER — PANTOPRAZOLE SODIUM 40 MG PO TBEC: 40.0000 mg | DELAYED_RELEASE_TABLET | Freq: Every day | ORAL | 3 refills | Status: DC

## 2019-10-12 MED ORDER — METOPROLOL TARTRATE 25 MG PO TABS: 12.5000 mg | ORAL_TABLET | Freq: Two times a day (BID) | ORAL | 3 refills | Status: DC

## 2019-10-12 MED ORDER — DICLOFENAC SODIUM 1 % EX GEL: 2.0000 g | Freq: Four times a day (QID) | CUTANEOUS | 5 refills | Status: DC

## 2019-10-12 MED ORDER — QUETIAPINE FUMARATE 100 MG PO TABS: 100.0000 mg | ORAL_TABLET | Freq: Two times a day (BID) | ORAL | 3 refills | Status: DC

## 2019-10-12 MED ORDER — GABAPENTIN 100 MG PO CAPS: 200.0000 mg | ORAL_CAPSULE | Freq: Three times a day (TID) | ORAL | 3 refills | Status: DC

## 2019-10-12 MED ORDER — SENNOSIDES-DOCUSATE SODIUM 8.6-50 MG PO TABS: 2.0000 | ORAL_TABLET | Freq: Two times a day (BID) | ORAL | Status: DC

## 2019-10-12 MED ORDER — TRAZODONE HCL 50 MG PO TABS: 25.0000 mg | ORAL_TABLET | Freq: Every evening | ORAL | 11 refills | Status: DC | PRN

## 2019-10-12 MED ORDER — TORSEMIDE 10 MG PO TABS: 10.0000 mg | ORAL_TABLET | Freq: Every day | ORAL | 3 refills | Status: DC

## 2019-10-12 MED ORDER — MOMETASONE FURO-FORMOTEROL FUM 100-5 MCG/ACT IN AERO: 2.0000 | INHALATION_SPRAY | Freq: Two times a day (BID) | RESPIRATORY_TRACT | 0 refills | Status: DC

## 2019-10-12 MED ORDER — INSULIN DETEMIR 100 UNIT/ML ~~LOC~~ SOLN: 33.0000 [IU] | Freq: Two times a day (BID) | SUBCUTANEOUS | 11 refills | Status: DC

## 2019-10-12 MED ORDER — ATORVASTATIN CALCIUM 10 MG PO TABS: 10.0000 mg | ORAL_TABLET | Freq: Every day | ORAL | 3 refills | Status: DC

## 2019-10-12 MED ORDER — POTASSIUM CHLORIDE CRYS ER 20 MEQ PO TBCR: 20.0000 meq | EXTENDED_RELEASE_TABLET | Freq: Two times a day (BID) | ORAL | 3 refills | Status: DC

## 2019-10-12 MED ORDER — SERTRALINE HCL 100 MG PO TABS: 100.0000 mg | ORAL_TABLET | Freq: Every day | ORAL | 3 refills | Status: DC

## 2019-10-12 MED ORDER — ALLOPURINOL 100 MG PO TABS: 100.0000 mg | ORAL_TABLET | Freq: Every day | ORAL | 3 refills | Status: DC

## 2019-10-12 MED ORDER — IPRATROPIUM-ALBUTEROL 0.5-2.5 (3) MG/3ML IN SOLN: 3.0000 mL | Freq: Four times a day (QID) | RESPIRATORY_TRACT | Status: DC | PRN

## 2019-10-12 NOTE — Progress Notes (Addendum)
[facility] [place] [place], [place]  [address] Phone:  [phone]   Fax:  [phone]   New Patient Office Visit  Subjective:  Patient ID: Timothy Le, male    DOB: 03-28-1960  Age: 59 y.o. MRN: [id number]  CC:  Chief Complaint  Patient presents with  . Establish Care    HPI Timothy Le presents to establish care. He  has a past medical history of Asthma, Chronic respiratory failure with hypoxia ([place]) (07/09/2017), COPD (chronic obstructive pulmonary disease) ([place]), Diabetes mellitus without complication ([place]), Difficult intubation, Hypertension, Shortness of breath dyspnea, and Sleep apnea.   Diabetes Mellitus Patient presents for follow up of diabetes. Current symptoms include: none. Symptoms have stabilized. Patient denies foot ulcerations, hypoglycemia , nausea, paresthesia of the feet, polydipsia, polyuria, visual disturbances and vomiting. Evaluation to date has included: fasting blood sugar, fasting lipid panel and hemoglobin A1C.  Home sugars: patient does not check sugars. Current treatment: Continued insulin which has been effective and Continued statin which has been unable to assess effectiveness. Last dilated eye exam: unknown.   Past Medical History:  Diagnosis Date  . Asthma   . Chronic respiratory failure with hypoxia ([place]) 07/09/2017  . COPD (chronic obstructive pulmonary disease) ([place])   . Diabetes mellitus without complication ([place])   . Difficult intubation   . Hypertension   . Shortness of breath dyspnea   . Sleep apnea     Past Surgical History:  Procedure Laterality Date  . IR GASTROSTOMY TUBE MOD SED  07/05/2017  . THYROIDECTOMY Left 08/30/2019   Procedure: THYROIDECTOMY CERVICAL LIPECTOMY;  Surgeon: Izora Gala, MD;  Location: [facility];  Service: ENT;  Laterality: Left;  NEEDS RNFA PLEASE  . TRACHEOSTOMY TUBE PLACEMENT N/A 06/17/2017   Procedure: TRACHEOSTOMY;  Surgeon: Leta Baptist, MD;  Location: [facility];  Service: ENT;   Laterality: N/A;  . TRACHEOSTOMY TUBE PLACEMENT N/A 08/14/2019   Procedure: TRACHEOSTOMY;  Surgeon: Izora Gala, MD;  Location: [facility];  Service: ENT;  Laterality: N/A;  . TRACHEOSTOMY TUBE PLACEMENT N/A 08/30/2019   Procedure: TRACHEOSTOMY  REVISION;  Surgeon: Izora Gala, MD;  Location: [facility];  Service: ENT;  Laterality: N/A;    Family History  Problem Relation Age of Onset  . Asthma Sister   . Asthma Other        nephew  . Hypertension Sister   . Diabetes type II Sister     Social History   Socioeconomic History  . Marital status: Legally Separated    Spouse name: Not on file  . Number of children: 1  . Years of education: Not on file  . Highest education level: Not on file  Occupational History  . Occupation: unemployed    Comment: working on [employer]  . Smoking status: Former Smoker    Packs/day: 0.50    Years: 0.50    Pack years: 0.25    Types: Cigarettes    Start date: 07/28/1983    Quit date: 02/27/2013    Years since quitting: 6.6  . Smokeless tobacco: Never Used  Vaping Use  . Vaping Use: Never used  Substance and Sexual Activity  . Alcohol use: No    Comment: quit ETOH about 5 months ago-beer and liquor  . Drug use: No    Comment: quit 6 months-"pot"-smoked couple joints a week  . Sexual activity: Not Currently  Other Topics Concern  . Not on file  Social History Narrative  .  Not on file   Social Determinants of Health   Financial Resource Strain:   . Difficulty of Paying Living Expenses: Not on file  Food Insecurity:   . Worried About Charity fundraiser in the Last Year: Not on file  . Ran Out of Food in the Last Year: Not on file  Transportation Needs:   . Lack of Transportation (Medical): Not on file  . Lack of Transportation (Non-Medical): Not on file  Physical Activity:   . Days of Exercise per Week: Not on file  . Minutes of Exercise per Session: Not on file  Stress:   . Feeling of Stress : Not on file  Social Connections:     . Frequency of Communication with Friends and Family: Not on file  . Frequency of Social Gatherings with Friends and Family: Not on file  . Attends Religious Services: Not on file  . Active Member of Clubs or Organizations: Not on file  . Attends Archivist Meetings: Not on file  . Marital Status: Not on file  Intimate Partner Violence:   . Fear of Current or Ex-Partner: Not on file  . Emotionally Abused: Not on file  . Physically Abused: Not on file  . Sexually Abused: Not on file    ROS Review of Systems  Respiratory:       Swollen gland uses humifier He has home health nurse and PT    Objective:   Today's Vitals: BP (!) 114/52   Pulse 84   Temp 97.9 F (36.6 C) (Temporal)   Ht 5\' 8"  (1.727 m)   Wt (!) 318 lb (144.2 kg)   SpO2 95%   BMI 48.35 kg/m   Physical Exam Constitutional:      Appearance: He is obese.  HENT:     Head: Normocephalic and atraumatic.     Nose: Nose normal.     Mouth/Throat:     Mouth: Mucous membranes are moist.  Neck:     Trachea: Tracheostomy present.  Cardiovascular:     Rate and Rhythm: Normal rate and regular rhythm.     Pulses: Normal pulses.     Heart sounds: Normal heart sounds.  Pulmonary:     Effort: Pulmonary effort is normal.     Breath sounds: Normal breath sounds.     Comments: Trach  Abdominal:     Palpations: Abdomen is soft.     Comments: obese  Musculoskeletal:        General: Normal range of motion.     Cervical back: Normal range of motion.  Skin:    General: Skin is warm and dry.     Capillary Refill: Capillary refill takes less than 2 seconds.  Neurological:     General: No focal deficit present.     Mental Status: He is alert and oriented to person, place, and time.  Psychiatric:        Mood and Affect: Mood normal.        Behavior: Behavior normal.        Thought Content: Thought content normal.        Judgment: Judgment normal.     Assessment & Plan:   Problem List Items Addressed This  Visit      Cardiovascular and Mediastinum   Benign essential HTN Encouraged on going compliance with current medication regimen Encouraged home monitoring and recording BP <130/80 Eating a heart-healthy diet with less salt Encouraged regular physical activity  Recommend Weight loss  Relevant Medications   atorvastatin (LIPITOR) 10 MG tablet   metoprolol tartrate (LOPRESSOR) 25 MG tablet   torsemide (DEMADEX) 10 MG tablet     Endocrine   Type 2 diabetes mellitus with hyperosmolar nonketotic hyperglycemia (HCC) - Primary Encourage compliance with current treatment regimen  Encourage regular CBG monitoring Encourage contacting office if excessive hyperglycemia and or hypoglycemia Lifestyle modification with healthy diet (fewer calories, more high fiber foods, whole grains and non-starchy vegetables, lower fat meat and fish, low-fat diary include healthy oils) regular exercise (physical activity) and weight loss Opthalmology exam discussed  Nutritional consult recommended Regular dental visits encouraged Home BP monitoring also encouraged goal <130/80     Relevant Medications   atorvastatin (LIPITOR) 10 MG tablet   insulin detemir (LEVEMIR) 100 UNIT/ML injection   Other Relevant Orders   Comp. Metabolic Panel (12) (Completed)   Microalbumin, urine (Completed)   POCT HgB A1C (Completed)   POCT urinalysis dipstick (Completed)     Other   Hyperlipidemia   Relevant Medications   atorvastatin (LIPITOR) 10 MG tablet   metoprolol tartrate (LOPRESSOR) 25 MG tablet   torsemide (DEMADEX) 10 MG tablet   Other Relevant Orders   Lipid panel (Completed)   Tracheostomy dependence ([place]) Continue to follow up with specialist respiratory and pulmonlogy as scheduled  Continue to work with home health  Notify when supplies are needed      Outpatient Encounter Medications as of 10/12/2019  Medication Sig  . acetaminophen (TYLENOL) 325 MG tablet Take 1-2 tablets (325-650 mg total) by  mouth every 4 (four) hours as needed for mild pain.  Marland Kitchen allopurinol (ZYLOPRIM) 100 MG tablet Take 1 tablet (100 mg total) by mouth daily.  Marland Kitchen alum & mag hydroxide-simeth (MAALOX/MYLANTA) 200-200-20 MG/5ML suspension Take 30 mLs by mouth every 4 (four) hours as needed for indigestion, heartburn or flatulence.  Marland Kitchen atorvastatin (LIPITOR) 10 MG tablet Take 1 tablet (10 mg total) by mouth daily.  Marland Kitchen dextromethorphan-guaiFENesin (MUCINEX DM) 30-600 MG 12hr tablet Take 1 tablet by mouth 2 (two) times daily.  . diclofenac Sodium (VOLTAREN) 1 % GEL Apply 2 g topically 4 (four) times daily.  Marland Kitchen gabapentin (NEURONTIN) 100 MG capsule Take 2 capsules (200 mg total) by mouth 3 (three) times daily.  . insulin detemir (LEVEMIR) 100 UNIT/ML injection Inject 0.33 mLs (33 Units total) into the skin every 12 (twelve) hours.  Marland Kitchen ipratropium-albuterol (DUONEB) 0.5-2.5 (3) MG/3ML SOLN Take 3 mLs by nebulization every 6 (six) hours as needed.  . metoprolol tartrate (LOPRESSOR) 25 MG tablet Take 0.5 tablets (12.5 mg total) by mouth 2 (two) times daily.  . mometasone-formoterol (DULERA) 100-5 MCG/ACT AERO Inhale 2 puffs into the lungs 2 (two) times daily.  . Multiple Vitamin (MULTIVITAMIN WITH MINERALS) TABS tablet Take 1 tablet by mouth daily.  . nutrition supplement, JUVEN, (JUVEN) PACK Take 1 packet by mouth 2 (two) times daily between meals.  . Oxycodone HCl 10 MG TABS Take 0.5 tablets (5 mg total) by mouth daily as needed (severe pain).  . pantoprazole (PROTONIX) 40 MG tablet Take 1 tablet (40 mg total) by mouth at bedtime.  . potassium chloride SA (KLOR-CON) 20 MEQ tablet Take 1 tablet (20 mEq total) by mouth 2 (two) times daily.  . QUEtiapine (SEROQUEL) 100 MG tablet Take 1 tablet (100 mg total) by mouth 2 (two) times daily.  Marland Kitchen senna-docusate (SENOKOT-S) 8.6-50 MG tablet Take 2 tablets by mouth 2 (two) times daily.  . sertraline (ZOLOFT) 100 MG tablet Take 1 tablet (100  mg total) by mouth daily.  Marland Kitchen torsemide (DEMADEX) 10  MG tablet Take 1 tablet (10 mg total) by mouth daily.  . traZODone (DESYREL) 50 MG tablet Take 0.5-1 tablets (25-50 mg total) by mouth at bedtime as needed for sleep.  . [DISCONTINUED] allopurinol (ZYLOPRIM) 100 MG tablet Take 1 tablet (100 mg total) by mouth daily.  . [DISCONTINUED] atorvastatin (LIPITOR) 10 MG tablet Take 10 mg by mouth daily.  . [DISCONTINUED] diclofenac Sodium (VOLTAREN) 1 % GEL Apply 2 g topically 4 (four) times daily.  . [DISCONTINUED] gabapentin (NEURONTIN) 100 MG capsule Take 2 capsules (200 mg total) by mouth 3 (three) times daily.  . [DISCONTINUED] insulin detemir (LEVEMIR) 100 UNIT/ML injection Inject 0.33 mLs (33 Units total) into the skin every 12 (twelve) hours.  . [DISCONTINUED] ipratropium-albuterol (DUONEB) 0.5-2.5 (3) MG/3ML SOLN Take 3 mLs by nebulization every 6 (six) hours as needed.  . [DISCONTINUED] melatonin 3 MG TABS tablet Take 1 tablet (3 mg total) by mouth at bedtime.  . [DISCONTINUED] metoprolol tartrate (LOPRESSOR) 25 MG tablet Take 0.5 tablets (12.5 mg total) by mouth 2 (two) times daily.  . [DISCONTINUED] mometasone-formoterol (DULERA) 100-5 MCG/ACT AERO Inhale 2 puffs into the lungs 2 (two) times daily.  . [DISCONTINUED] pantoprazole (PROTONIX) 40 MG tablet Take 1 tablet (40 mg total) by mouth at bedtime.  . [DISCONTINUED] potassium chloride SA (KLOR-CON) 20 MEQ tablet Take 1 tablet (20 mEq total) by mouth 2 (two) times daily.  . [DISCONTINUED] QUEtiapine (SEROQUEL) 100 MG tablet Take 1 tablet (100 mg total) by mouth 2 (two) times daily.  . [DISCONTINUED] senna-docusate (SENOKOT-S) 8.6-50 MG tablet Take 2 tablets by mouth 2 (two) times daily.  . [DISCONTINUED] sertraline (ZOLOFT) 100 MG tablet Take 1 tablet (100 mg total) by mouth daily.  . [DISCONTINUED] torsemide (DEMADEX) 10 MG tablet Take 1 tablet (10 mg total) by mouth daily.  . [DISCONTINUED] traZODone (DESYREL) 50 MG tablet Take 0.5-1 tablets (25-50 mg total) by mouth at bedtime as needed for  sleep.  . [DISCONTINUED] levofloxacin (LEVAQUIN) 500 MG tablet Take 1 tablet (500 mg total) by mouth daily. (Patient not taking: Reported on 10/12/2019)   No facility-administered encounter medications on file as of 10/12/2019.    Follow-up: Return in about 3 months (around 01/11/2020).   Vevelyn Francois, NP

## 2019-10-12 NOTE — Patient Instructions (Signed)
Healthy Eating Following a healthy eating pattern may help you to achieve and maintain a healthy body weight, reduce the risk of chronic disease, and live a long and productive life. It is important to follow a healthy eating pattern at an appropriate calorie level for your body. Your nutritional needs should be met primarily through food by choosing a variety of nutrient-rich foods. What are tips for following this plan? Reading food labels  Read labels and choose the following: ? Reduced or low sodium. ? Juices with 100% fruit juice. ? Foods with low saturated fats and high polyunsaturated and monounsaturated fats. ? Foods with whole grains, such as whole wheat, cracked wheat, brown rice, and wild rice. ? Whole grains that are fortified with folic acid. This is recommended for women who are pregnant or who want to become pregnant.  Read labels and avoid the following: ? Foods with a lot of added sugars. These include foods that contain brown sugar, corn sweetener, corn syrup, dextrose, fructose, glucose, high-fructose corn syrup, honey, invert sugar, lactose, malt syrup, maltose, molasses, raw sugar, sucrose, trehalose, or turbinado sugar.  Do not eat more than the following amounts of added sugar per day:  6 teaspoons (25 g) for women.  9 teaspoons (38 g) for men. ? Foods that contain processed or refined starches and grains. ? Refined grain products, such as white flour, degermed cornmeal, white bread, and white rice. Shopping  Choose nutrient-rich snacks, such as vegetables, whole fruits, and nuts. Avoid high-calorie and high-sugar snacks, such as potato chips, fruit snacks, and candy.  Use oil-based dressings and spreads on foods instead of solid fats such as butter, stick margarine, or cream cheese.  Limit pre-made sauces, mixes, and "instant" products such as flavored rice, instant noodles, and ready-made pasta.  Try more plant-protein sources, such as tofu, tempeh, black beans,  edamame, lentils, nuts, and seeds.  Explore eating plans such as the Mediterranean diet or vegetarian diet. Cooking  Use oil to saut or stir-fry foods instead of solid fats such as butter, stick margarine, or lard.  Try baking, boiling, grilling, or broiling instead of frying.  Remove the fatty part of meats before cooking.  Steam vegetables in water or broth. Meal planning   At meals, imagine dividing your plate into fourths: ? One-half of your plate is fruits and vegetables. ? One-fourth of your plate is whole grains. ? One-fourth of your plate is protein, especially lean meats, poultry, eggs, tofu, beans, or nuts.  Include low-fat dairy as part of your daily diet. Lifestyle  Choose healthy options in all settings, including home, work, school, restaurants, or stores.  Prepare your food safely: ? Wash your hands after handling raw meats. ? Keep food preparation surfaces clean by regularly washing with hot, soapy water. ? Keep raw meats separate from ready-to-eat foods, such as fruits and vegetables. ? Cook seafood, meat, poultry, and eggs to the recommended internal temperature. ? Store foods at safe temperatures. In general:  Keep cold foods at 59F (4.4C) or below.  Keep hot foods at 159F (60C) or above.  Keep your freezer at [facility] (-17.8C) or below.  Foods are no longer safe to eat when they have been between the temperatures of 40-159F (4.4-60C) for more than 2 hours. What foods should I eat? Fruits Aim to eat 2 cup-equivalents of fresh, canned (in natural juice), or frozen fruits each day. Examples of 1 cup-equivalent of fruit include 1 small apple, 8 large strawberries, 1 cup canned fruit,  cup  dried fruit, or 1 cup 100% juice. Vegetables Aim to eat 2-3 cup-equivalents of fresh and frozen vegetables each day, including different varieties and colors. Examples of 1 cup-equivalent of vegetables include 2 medium carrots, 2 cups raw, leafy greens, 1 cup chopped  vegetable (raw or cooked), or 1 medium baked potato. Grains Aim to eat 6 ounce-equivalents of whole grains each day. Examples of 1 ounce-equivalent of grains include 1 slice of bread, 1 cup ready-to-eat cereal, 3 cups popcorn, or  cup cooked rice, pasta, or cereal. Meats and other proteins Aim to eat 5-6 ounce-equivalents of protein each day. Examples of 1 ounce-equivalent of protein include 1 egg, 1/2 cup nuts or seeds, or 1 tablespoon (16 g) peanut butter. A cut of meat or fish that is the size of a deck of cards is about 3-4 ounce-equivalents.  Of the protein you eat each week, try to have at least 8 ounces come from seafood. This includes salmon, trout, herring, and anchovies. Dairy Aim to eat 3 cup-equivalents of fat-free or low-fat dairy each day. Examples of 1 cup-equivalent of dairy include 1 cup (240 mL) milk, 8 ounces (250 g) yogurt, 1 ounces (44 g) natural cheese, or 1 cup (240 mL) fortified soy milk. Fats and oils  Aim for about 5 teaspoons (21 g) per day. Choose monounsaturated fats, such as canola and olive oils, avocados, peanut butter, and most nuts, or polyunsaturated fats, such as sunflower, corn, and soybean oils, walnuts, pine nuts, sesame seeds, sunflower seeds, and flaxseed. Beverages  Aim for six 8-oz glasses of water per day. Limit coffee to three to five 8-oz cups per day.  Limit caffeinated beverages that have added calories, such as soda and energy drinks.  Limit alcohol intake to no more than 1 drink a day for nonpregnant women and 2 drinks a day for men. One drink equals 12 oz of beer (355 mL), 5 oz of wine (148 mL), or 1 oz of hard liquor (44 mL). Seasoning and other foods  Avoid adding excess amounts of salt to your foods. Try flavoring foods with herbs and spices instead of salt.  Avoid adding sugar to foods.  Try using oil-based dressings, sauces, and spreads instead of solid fats. This information is based on general [place] nutrition guidelines. For more  information, visit [employer].es. Exact amounts may vary based on your nutrition needs. Summary  A healthy eating plan may help you to maintain a healthy weight, reduce the risk of chronic diseases, and stay active throughout your life.  Plan your meals. Make sure you eat the right portions of a variety of nutrient-rich foods.  Try baking, boiling, grilling, or broiling instead of frying.  Choose healthy options in all settings, including home, work, school, restaurants, or stores. This information is not intended to replace advice given to you by your health care provider. Make sure you discuss any questions you have with your health care provider. Document Revised: 04/19/2017 Document Reviewed: 04/19/2017 Elsevier Patient Education  [place].

## 2019-10-13 LAB — LIPID PANEL
Chol/HDL Ratio: 3.5 ratio (ref 0.0–5.0)
Cholesterol, Total: 159 mg/dL (ref 100–199)
HDL: 45 mg/dL (ref >39)
LDL Chol Calc (NIH): 94 mg/dL (ref 0–99)
Triglycerides: 108 mg/dL (ref 0–149)
VLDL Cholesterol Cal: 20 mg/dL (ref 5–40)

## 2019-10-13 LAB — COMP. METABOLIC PANEL (12)
AST: 30 IU/L (ref 0–40)
Albumin/Globulin Ratio: 1.3 (ref 1.2–2.2)
Albumin: 4.2 g/dL (ref 3.8–4.9)
Alkaline Phosphatase: 118 IU/L (ref 44–121)
BUN/Creatinine Ratio: 21 — ABNORMAL HIGH (ref 9–20)
BUN: 23 mg/dL (ref 6–24)
Bilirubin Total: 0.3 mg/dL (ref 0.0–1.2)
Calcium: 9 mg/dL (ref 8.7–10.2)
Chloride: 103 mmol/L (ref 96–106)
Creatinine, Ser: 1.1 mg/dL (ref 0.76–1.27)
GFR calc Af Amer: 85 mL/min/{1.73_m2} (ref >59)
GFR calc non Af Amer: 74 mL/min/{1.73_m2} (ref >59)
Globulin, Total: 3.3 g/dL (ref 1.5–4.5)
Glucose: 136 mg/dL — ABNORMAL HIGH (ref 65–99)
Potassium: 4 mmol/L (ref 3.5–5.2)
Sodium: 144 mmol/L (ref 134–144)
Total Protein: 7.5 g/dL (ref 6.0–8.5)

## 2019-10-13 LAB — MICROALBUMIN, URINE: Microalbumin, Urine: 3.1 ug/mL

## 2019-10-20 ENCOUNTER — Other Ambulatory Visit (HOSPITAL_COMMUNITY): Payer: Medicaid Other

## 2019-10-23 ENCOUNTER — Other Ambulatory Visit: Payer: Self-pay | Admitting: Physical Medicine and Rehabilitation

## 2019-10-25 ENCOUNTER — Ambulatory Visit (HOSPITAL_COMMUNITY): Admit: 2019-10-25 | Discharge: 2019-10-25 | Disposition: A | Discharge status: Home / Self Care | Payer: Medicaid Other

## 2019-10-25 ENCOUNTER — Other Ambulatory Visit: Payer: Self-pay

## 2019-11-06 ENCOUNTER — Other Ambulatory Visit (HOSPITAL_COMMUNITY)
Admission: RE | Admit: 2019-11-06 | Discharge: 2019-11-06 | Disposition: A | Discharge status: Home / Self Care | Payer: Medicaid Other | Source: Ambulatory Visit | Attending: Internal Medicine | Admitting: Internal Medicine

## 2019-11-06 DIAGNOSIS — Z20822 Contact with and (suspected) exposure to covid-19: Secondary | ICD-10-CM | POA: Diagnosis not present

## 2019-11-06 DIAGNOSIS — Z01812 Encounter for preprocedural laboratory examination: Secondary | ICD-10-CM | POA: Insufficient documentation

## 2019-11-06 LAB — SARS CORONAVIRUS 2 (TAT 6-24 HRS): SARS Coronavirus 2: NEGATIVE

## 2019-11-08 ENCOUNTER — Other Ambulatory Visit: Payer: Self-pay

## 2019-11-08 ENCOUNTER — Ambulatory Visit (HOSPITAL_COMMUNITY)
Admission: RE | Admit: 2019-11-08 | Discharge: 2019-11-08 | Disposition: A | Discharge status: Home / Self Care | Payer: Medicaid Other | Source: Ambulatory Visit | Attending: Acute Care | Admitting: Acute Care

## 2019-11-08 DIAGNOSIS — E662 Morbid (severe) obesity with alveolar hypoventilation: Secondary | ICD-10-CM | POA: Diagnosis not present

## 2019-11-08 DIAGNOSIS — J449 Chronic obstructive pulmonary disease, unspecified: Secondary | ICD-10-CM | POA: Diagnosis not present

## 2019-11-08 DIAGNOSIS — Z430 Encounter for attention to tracheostomy: Secondary | ICD-10-CM | POA: Insufficient documentation

## 2019-11-08 DIAGNOSIS — Z930 Tracheostomy status: Secondary | ICD-10-CM | POA: Diagnosis not present

## 2019-11-08 DIAGNOSIS — J9610 Chronic respiratory failure, unspecified whether with hypoxia or hypercapnia: Secondary | ICD-10-CM | POA: Diagnosis not present

## 2019-11-08 DIAGNOSIS — G4733 Obstructive sleep apnea (adult) (pediatric): Secondary | ICD-10-CM | POA: Diagnosis not present

## 2019-11-08 NOTE — Progress Notes (Signed)
[facility]   Reason for visit:  Planned trach change  HPI:  59 year old male well known to me. 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 Had actually been decannulated at one point after sig weight loss but after this acute hospitalization back in July/August where he ended up needing trach. This was actually difficult and was initially attempted in ICU but could not be successfully placed and required ENT to perform thyroidectomy in order to support placement. He is now at home s/p rehab and doing well. He reports today for planned and routine trach change  ROS  Review of Systems - History obtained from the patient General ROS: negative ENT ROS: negative Allergy and Immunology ROS: negative Endocrine ROS: negative Respiratory ROS: no cough, shortness of breath, or wheezing Cardiovascular ROS: no chest pain or dyspnea on exertion Gastrointestinal ROS: no abdominal pain, change in bowel habits, or black or bloody stools Genito-Urinary ROS: no dysuria, trouble voiding, or hematuria  Vital signs:  Reviewed  Exam:   general this is a 59 year old male he is sitting up in chair. Not in distress. Currently on room air. Phonation quality is excellent. Cough mechanics are strong HENT # 5 proximal XLT is in place. Excellent phonation. No secretions Pulm clear no accessory use Card RRR abd not tender + bowel sounds Ext warm and dry  Neuro intact  Trach change/procedure: the # 5 proximal xlt was removed. The site was inspected and noted to be unremarkable. A new # 5 proximal xlt was placed. The patient tolerated this well.       Impression/dx  Trach dependence OHS/OSA COPD Chronic respiratory failure  Discussion  Kari is doing excellent and has essentially recovered from his hospitalization this summer. He no longer is using CPAP given he has trach and from a trach/pulmonary stand-point he looks really good. I am not  sure that he will be a candidate for decannulation given my discussion w/ Dr Constance Holster who did his trach. Apparently he has significant anatomical concerns and he is concerned that he would need the trach indef.  Plan  Cont routine trach care  Will see him again in 3 months.  IF he were to want trach out we would need to refer him back to Dr Constance Holster Which I am happy to do but I doubt he will recommend decannulation based on our prior conversation     Visit time: 33 minutes.   Erick Colace ACNP-BC [place]

## 2019-11-08 NOTE — Progress Notes (Signed)
Tracheostomy Procedure Note  Timothy Le [id number] 09/08/1960  Pre Procedure Tracheostomy Information  Trach Brand: Shiley Size: 5.0 XLT U PROXIMAL Style: Uncuffed Secured by: Velcro   Procedure: TRACH CHANGE AND TRACH CLEANING    Post Procedure Tracheostomy Information  Trach Brand: Shiley Size: 5.0 XLT U PROXIMAL Style: Uncuffed Secured by: Velcro   Post Procedure Evaluation:  ETCO2 positive color change from yellow to purple : Yes.   Vital signs:VSS Patients current condition: stable Complications: No apparent complications Trach site exam: clean, dry Wound care done: 4 x 4 gauze DRAIN Patient did tolerate procedure well.   Education: NONE  Prescription needs: NONE    Additional needs :NEW PMV AT THIS VISIT

## 2019-11-16 ENCOUNTER — Emergency Department (HOSPITAL_COMMUNITY)
Admission: EM | Admit: 2019-11-16 | Discharge: 2019-11-16 | Disposition: A | Discharge status: Home / Self Care | Payer: Medicaid Other | Attending: Emergency Medicine | Admitting: Emergency Medicine

## 2019-11-16 ENCOUNTER — Encounter (HOSPITAL_COMMUNITY): Payer: Self-pay

## 2019-11-16 DIAGNOSIS — Z87891 Personal history of nicotine dependence: Secondary | ICD-10-CM | POA: Insufficient documentation

## 2019-11-16 DIAGNOSIS — Z794 Long term (current) use of insulin: Secondary | ICD-10-CM | POA: Diagnosis not present

## 2019-11-16 DIAGNOSIS — J9503 Malfunction of tracheostomy stoma: Secondary | ICD-10-CM | POA: Diagnosis not present

## 2019-11-16 DIAGNOSIS — I5032 Chronic diastolic (congestive) heart failure: Secondary | ICD-10-CM | POA: Insufficient documentation

## 2019-11-16 DIAGNOSIS — E1100 Type 2 diabetes mellitus with hyperosmolarity without nonketotic hyperglycemic-hyperosmolar coma (NKHHC): Secondary | ICD-10-CM | POA: Insufficient documentation

## 2019-11-16 DIAGNOSIS — J449 Chronic obstructive pulmonary disease, unspecified: Secondary | ICD-10-CM | POA: Diagnosis not present

## 2019-11-16 DIAGNOSIS — I110 Hypertensive heart disease with heart failure: Secondary | ICD-10-CM | POA: Diagnosis not present

## 2019-11-16 DIAGNOSIS — Z79899 Other long term (current) drug therapy: Secondary | ICD-10-CM | POA: Insufficient documentation

## 2019-11-16 DIAGNOSIS — R0602 Shortness of breath: Secondary | ICD-10-CM | POA: Diagnosis present

## 2019-11-16 NOTE — ED Provider Notes (Signed)
[facility] EMERGENCY DEPARTMENT Provider Note   CSN: [id number] Arrival date & time: 11/16/19  1949     History Chief Complaint  Patient presents with  . Shortness of Breath    BAY WAYSON is a 59 y.o. male.  59yo M w/PMH below including morbid obesity, OSA, chronic respiratory failure s/p trach, thyroidectomy who p/w trach dislodgement.  Just prior to arrival, the patient's trach strap had loosened and it accidentally slipped out.  He had an extra trach at home and tried to replace it himself without success.  EMS was called and they also tried replacing it without success.  Vital signs have been stable in route on RA. PT denies complaints.  The history is provided by the patient and the EMS personnel.  Shortness of Breath      Past Medical History:  Diagnosis Date  . Asthma   . Chronic respiratory failure with hypoxia ([place]) 07/09/2017  . COPD (chronic obstructive pulmonary disease) ([place])   . Diabetes mellitus without complication ([place])   . Difficult intubation   . Hypertension   . Shortness of breath dyspnea   . Sleep apnea     Patient Active Problem List   Diagnosis Date Noted  . Pain due to onychomycosis of toenails of both feet 09/27/2019  . Tracheobronchitis 09/26/2019  . UTI (urinary tract infection) 09/26/2019  . Debility 09/15/2019  . Chronic diastolic CHF (congestive heart failure) ([place]) 09/07/2019  . Poorly controlled diabetes mellitus ([place]) 09/06/2019  . Acute airway obstruction   . Endotracheal tube present   . Acute on chronic respiratory failure ([place]) 07/30/2019  . Acute respiratory failure ([place]) 07/30/2019  . Acute respiratory failure with hypoxia and hypercapnia (HCC)   . Nodule of soft tissue 01/24/2019  . Benign essential HTN   . Hyperlipidemia   . Tracheostomy dependence ([place])   . Physical deconditioning 07/15/2017  . Chronic respiratory failure with hypoxia ([place]) 07/09/2017  . Acute pulmonary edema (HCC)   .  Decubitus ulcer 06/19/2017  . Difficult airway for intubation   . Hypoventilation associated with obesity syndrome ([place]) 06/09/2017  . Seasonal and perennial allergic rhinitis 05/24/2016  . AKI (acute kidney injury) ([place]) 04/08/2015  . Hyperosmolar hyperglycemic state (HHS) ([place]) 03/28/2015  . Type 2 diabetes mellitus with hyperosmolar nonketotic hyperglycemia ([place]) 03/28/2015  . COPD mixed type ([place]) 12/27/2014  . Severe obesity (BMI >= 40) ([place]) 12/27/2014  . Diabetes mellitus type 2, controlled ([place]) 12/27/2014    Class: Chronic  . OSA (obstructive sleep apnea) 07/29/2013  . Peripheral edema 07/29/2013    Past Surgical History:  Procedure Laterality Date  . IR GASTROSTOMY TUBE MOD SED  07/05/2017  . THYROIDECTOMY Left 08/30/2019   Procedure: THYROIDECTOMY CERVICAL LIPECTOMY;  Surgeon: Izora Gala, MD;  Location: [facility];  Service: ENT;  Laterality: Left;  NEEDS RNFA PLEASE  . TRACHEOSTOMY TUBE PLACEMENT N/A 06/17/2017   Procedure: TRACHEOSTOMY;  Surgeon: Leta Baptist, MD;  Location: [facility];  Service: ENT;  Laterality: N/A;  . TRACHEOSTOMY TUBE PLACEMENT N/A 08/14/2019   Procedure: TRACHEOSTOMY;  Surgeon: Izora Gala, MD;  Location: [facility];  Service: ENT;  Laterality: N/A;  . TRACHEOSTOMY TUBE PLACEMENT N/A 08/30/2019   Procedure: TRACHEOSTOMY  REVISION;  Surgeon: Izora Gala, MD;  Location: [facility];  Service: ENT;  Laterality: N/A;       Family History  Problem Relation Age of Onset  . Asthma Sister   . Asthma Other        nephew  .  Hypertension Sister   . Diabetes type II Sister     Social History   Tobacco Use  . Smoking status: Former Smoker    Packs/day: 0.50    Years: 0.50    Pack years: 0.25    Types: Cigarettes    Start date: 07/28/1983    Quit date: 02/27/2013    Years since quitting: 6.7  . Smokeless tobacco: Never Used  Vaping Use  . Vaping Use: Never used  Substance Use Topics  . Alcohol use: No    Comment: quit ETOH about 5 months ago-beer and liquor  . Drug  use: No    Comment: quit 6 months-"pot"-smoked couple joints a week    Home Medications Prior to Admission medications   Medication Sig Start Date End Date Taking? Authorizing Provider  acetaminophen (TYLENOL) 325 MG tablet Take 1-2 tablets (325-650 mg total) by mouth every 4 (four) hours as needed for mild pain. 09/26/19   Love, Ivan Anchors, PA-C  allopurinol (ZYLOPRIM) 100 MG tablet Take 1 tablet (100 mg total) by mouth daily. 10/12/19 10/11/20  Vevelyn Francois, NP  alum & mag hydroxide-simeth (MAALOX/MYLANTA) 200-200-20 MG/5ML suspension Take 30 mLs by mouth every 4 (four) hours as needed for indigestion, heartburn or flatulence. 09/15/19   Samella Parr, NP  atorvastatin (LIPITOR) 10 MG tablet Take 1 tablet (10 mg total) by mouth daily. 10/12/19 10/11/20  Vevelyn Francois, NP  dextromethorphan-guaiFENesin (MUCINEX DM) 30-600 MG 12hr tablet Take 1 tablet by mouth 2 (two) times daily. 09/15/19   Samella Parr, NP  diclofenac Sodium (VOLTAREN) 1 % GEL Apply 2 g topically 4 (four) times daily. 10/12/19   Vevelyn Francois, NP  gabapentin (NEURONTIN) 100 MG capsule Take 2 capsules (200 mg total) by mouth 3 (three) times daily. 10/12/19 10/11/20  Vevelyn Francois, NP  insulin detemir (LEVEMIR) 100 UNIT/ML injection Inject 0.33 mLs (33 Units total) into the skin every 12 (twelve) hours. 10/12/19   Vevelyn Francois, NP  ipratropium-albuterol (DUONEB) 0.5-2.5 (3) MG/3ML SOLN Take 3 mLs by nebulization every 6 (six) hours as needed. 10/12/19 10/11/20  Vevelyn Francois, NP  metoprolol tartrate (LOPRESSOR) 25 MG tablet Take 0.5 tablets (12.5 mg total) by mouth 2 (two) times daily. 10/12/19 10/11/20  Vevelyn Francois, NP  mometasone-formoterol (DULERA) 100-5 MCG/ACT AERO Inhale 2 puffs into the lungs 2 (two) times daily. 10/12/19   Vevelyn Francois, NP  Multiple Vitamin (MULTIVITAMIN WITH MINERALS) TABS tablet Take 1 tablet by mouth daily. 09/16/19   Samella Parr, NP  nutrition supplement, JUVEN, (JUVEN) PACK Take 1 packet  by mouth 2 (two) times daily between meals. 09/26/19   Love, Ivan Anchors, PA-C  Oxycodone HCl 10 MG TABS Take 0.5 tablets (5 mg total) by mouth daily as needed (severe pain). 09/26/19   Love, Ivan Anchors, PA-C  pantoprazole (PROTONIX) 40 MG tablet Take 1 tablet (40 mg total) by mouth at bedtime. 10/12/19 10/11/20  Vevelyn Francois, NP  potassium chloride SA (KLOR-CON) 20 MEQ tablet Take 1 tablet (20 mEq total) by mouth 2 (two) times daily. 10/12/19 10/11/20  Vevelyn Francois, NP  QUEtiapine (SEROQUEL) 100 MG tablet Take 1 tablet (100 mg total) by mouth 2 (two) times daily. 10/12/19 10/11/20  Vevelyn Francois, NP  senna-docusate (SENOKOT-S) 8.6-50 MG tablet Take 2 tablets by mouth 2 (two) times daily. 10/12/19   Vevelyn Francois, NP  sertraline (ZOLOFT) 100 MG tablet Take 1 tablet (100 mg total) by mouth daily.  10/12/19 10/11/20  Vevelyn Francois, NP  torsemide (DEMADEX) 10 MG tablet Take 1 tablet (10 mg total) by mouth daily. 10/12/19 10/11/20  Vevelyn Francois, NP  traZODone (DESYREL) 50 MG tablet Take 0.5-1 tablets (25-50 mg total) by mouth at bedtime as needed for sleep. 10/12/19 10/11/20  Vevelyn Francois, NP    Allergies    Patient has no known allergies.  Review of Systems   Review of Systems  Respiratory: Positive for shortness of breath.    All other systems reviewed and are negative except that which was mentioned in HPI  Physical Exam Updated Vital Signs BP (!) 102/56   Pulse 77   Temp (!) 97.5 F (36.4 C) (Temporal)   Resp 17   SpO2 99%   Physical Exam Vitals and nursing note reviewed.  Constitutional:      General: He is not in acute distress.    Appearance: He is well-developed. He is obese.  HENT:     Head: Normocephalic and atraumatic.  Eyes:     Conjunctiva/sclera: Conjunctivae normal.  Cardiovascular:     Rate and Rhythm: Normal rate and regular rhythm.     Heart sounds: Normal heart sounds. No murmur heard.   Pulmonary:     Effort: Pulmonary effort is normal.     Breath sounds: Normal  breath sounds.     Comments: Stoma in place w/ some granulation tissue, normal WOB, no distress Abdominal:     General: There is no distension.     Palpations: Abdomen is soft.     Tenderness: There is no abdominal tenderness.  Musculoskeletal:     Cervical back: Neck supple.  Skin:    General: Skin is warm and dry.     Coloration: Skin is not cyanotic.  Neurological:     Mental Status: He is alert and oriented to person, place, and time.     Comments: Fluent speech  Psychiatric:        Mood and Affect: Mood normal.        Judgment: Judgment normal.     Comments: pleasant     ED Results / Procedures / Treatments   Labs (all labs ordered are listed, but only abnormal results are displayed) Labs Reviewed - No data to display  EKG None  Radiology No results found.  Procedures Procedures (including critical care time)  Medications Ordered in ED Medications - No data to display  ED Course  I have reviewed the triage vital signs and the nursing notes.      MDM Rules/Calculators/A&P                          Stable on RA on arrival. The RT and I both made multiple attempts to replace trach, including sliding over [place], without success. Contacted ENT on call, Dr. Constance Holster, who came to ED and replaced trach at bedside. I appreciate his assistance with the patient's care. Pt comfortable on reassessment, counseled on return precautions. Final Clinical Impression(s) / ED Diagnoses Final diagnoses:  Tracheostomy malfunction [facility] [place])    Rx / DC Orders ED Discharge Orders    None       Devaeh Amadi, Wenda Overland, MD 11/16/19 2157

## 2019-11-16 NOTE — ED Triage Notes (Signed)
Pt bib [facility] for trach replacement. Pt's trach fell out today. Pt resp e/u, spo2 97% RA.

## 2019-11-16 NOTE — Consult Note (Signed)
Reason for Consult: Tracheostomy tube dislodged Referring Physician: Sharlett Iles, MD  Timothy Le is an 59 y.o. male.  HPI: Patient with long-term tracheostomy for the past couple of years.  I replaced it this past summer.  Couple of hours ago it came out and he was unable to get it back in.  Multiple times in the emergency department also failed.  He has a #5 extra long.  Past Medical History:  Diagnosis Date  . Asthma   . Chronic respiratory failure with hypoxia ([place]) 07/09/2017  . COPD (chronic obstructive pulmonary disease) ([place])   . Diabetes mellitus without complication ([place])   . Difficult intubation   . Hypertension   . Shortness of breath dyspnea   . Sleep apnea     Past Surgical History:  Procedure Laterality Date  . IR GASTROSTOMY TUBE MOD SED  07/05/2017  . THYROIDECTOMY Left 08/30/2019   Procedure: THYROIDECTOMY CERVICAL LIPECTOMY;  Surgeon: Izora Gala, MD;  Location: [facility];  Service: ENT;  Laterality: Left;  NEEDS RNFA PLEASE  . TRACHEOSTOMY TUBE PLACEMENT N/A 06/17/2017   Procedure: TRACHEOSTOMY;  Surgeon: Leta Baptist, MD;  Location: [facility];  Service: ENT;  Laterality: N/A;  . TRACHEOSTOMY TUBE PLACEMENT N/A 08/14/2019   Procedure: TRACHEOSTOMY;  Surgeon: Izora Gala, MD;  Location: [facility];  Service: ENT;  Laterality: N/A;  . TRACHEOSTOMY TUBE PLACEMENT N/A 08/30/2019   Procedure: TRACHEOSTOMY  REVISION;  Surgeon: Izora Gala, MD;  Location: [facility];  Service: ENT;  Laterality: N/A;    Family History  Problem Relation Age of Onset  . Asthma Sister   . Asthma Other        nephew  . Hypertension Sister   . Diabetes type II Sister     Social History:  reports that he quit smoking about 6 years ago. His smoking use included cigarettes. He started smoking about 36 years ago. He has a 0.25 pack-year smoking history. He has never used smokeless tobacco. He reports that he does not drink alcohol and does not use drugs.  Allergies: No Known  Allergies  Medications: Reviewed  No results found for this or any previous visit (from the past 52 hour(s)).  No results found.  JSE:GBTDVVOH except as listed in admit H&P  Blood pressure (!) 102/56, pulse 77, temperature (!) 97.5 F (36.4 C), temperature source Temporal, resp. rate 17, SpO2 99 %.  PHYSICAL EXAM: Overall appearance: Obese gentleman in no distress. Head:  Normocephalic, atraumatic. Ears: External ears look healthy. Nose: External nose is healthy in appearance. Internal nasal exam free of any lesions or obstruction. Oral Cavity/Pharynx:  There are no mucosal lesions or masses identified. Larynx/Hypopharynx: Deferred Neuro:  No identifiable neurologic deficits. Neck: No palpable neck masses.  Tracheostomy site is visualized.  Studies Reviewed: none  Procedures: I was unable to pass the tracheostomy with the obturator.  I was able to pass just the obturator into the trachea.  Topical Xylocaine gel was used.  I was able to dilate with a #5 endotracheal tube and then a hemostat and then was ultimately able to pass the 5 extra long tracheostomy tube.  He had some bloody secretions which quickly stopped.  Position was confirmed with CO2 monitor.  He tolerated this well.   Assessment/Plan: Tracheostomy tube replaced with some difficulty but ultimately placed and positioning confirmed.  Follow-up as needed.  Izora Gala 11/16/2019, 9:26 PM

## 2019-11-24 ENCOUNTER — Ambulatory Visit (INDEPENDENT_AMBULATORY_CARE_PROVIDER_SITE_OTHER): Payer: Medicaid Other | Admitting: Internal Medicine

## 2019-11-24 ENCOUNTER — Other Ambulatory Visit: Payer: Self-pay

## 2019-11-24 ENCOUNTER — Encounter: Payer: Self-pay | Admitting: Internal Medicine

## 2019-11-24 DIAGNOSIS — J9611 Chronic respiratory failure with hypoxia: Secondary | ICD-10-CM

## 2019-11-24 DIAGNOSIS — Z930 Tracheostomy status: Secondary | ICD-10-CM

## 2019-11-24 DIAGNOSIS — J449 Chronic obstructive pulmonary disease, unspecified: Secondary | ICD-10-CM

## 2019-11-24 NOTE — Patient Instructions (Signed)
Ok to continue using Dulera inhaler   2 puffs, twice daily  Ok to use your nebulizer machine with the ipratropium-albuterol breathing medicine up to 4 times daily, If Needed  Follow up with Laurey Arrow at the tracheostomy clinic as he directs or as you need.  I do strongly recommend you go ahead today and get Covid vaccine  Please call if we can help

## 2019-11-24 NOTE — Assessment & Plan Note (Signed)
Oxygenating well on room air now, at least while awake.  What got him in trouble originally was obesity-hypoventilation.  Not needing much bronchodilator and doing better since he quit smoking.

## 2019-11-24 NOTE — Progress Notes (Signed)
HPI  M former smoker followed for COPD, chronic hypoxic respiratory failure OSA, OHS, complicated by morbid obesity, DM2, CHF, anemia PFT 09/21/13-severe obstruction with slight response to bronchodilator, restriction of exhaled volume, mild reduction diffusion NPSG 08/08/13- AHI 21.1/ hr, desaturation to 80%, body weight 385 lbs BIPAP titration 09/05/15 optimal pressure 22/18, body weight 365 lbs PFT 10/11/2018- Severe obstruction with response to dilator. Severe restriction, diffusion moderately reduced. FVC 2.16/ 57%, FEV1 1.77/ 59%, Ratio 0.82, TLC 66%, DLCO 66% --------------------------------------------------------  10/04/19- 59 year old male former smoker followed for COPD, hypoxic respiratory failure/ Tracheostomy/ Tracheal Stenosis, OSA, complicated by morbid obesity, OHS, DM2, CHF/ atherosclerosis, Goiter, COPD, Morbid Obesity, hx thyroidectomy,  BIPAP 22/18/ APS/ [employer] Download- unable to wear BIPAP now with tracheostomy Body weight today- 311 lbs Covid vax- Arrival sat 97% on room air [place] f/u- 8/27-09/26/19- Debility, BiPAP wasn't working for 2 weeks and he presented at ER unresponsive requiring intubation. Tracheostomy( 5 XLT), mucus plugging, bradycardic arrest/ resuscitation. Pseudomonas UTI/ sputum, TRx'd ceftazidime/ levaquin. Discharged w HHPT/ OT/ST and Nursing by [employer].  Evonnie Dawes speaking valve to be off with naps and nights.  Dulera 100, neb Doneb,   Melatonin, Gabapentin,  Niece checks on him daily. Ride brought him on the wrong day today so I worked him in. He says his BIPAP mask broke and he didn't have a contact number for [employer]. He didn't try to reach [place]. He feels "tired" with some raspy cough. Has O2 tank at home- not using.   His tracheostomy tie was filthy. He had a spare, so I changed it for him.  ECHO 07/31/19- EF 50-55%. No def [place] CXR 09/19/19- IMPRESSION: 1. Tracheostomy tube in stable position. PICC line stable position. Interim removal of feeding  tube. 2.  Stable cardiomegaly.  No pulmonary venous congestion. 3. Low lung volumes with bibasilar atelectasis. Mild bilateral interstitial prominence. Mild pneumonitis cannot be excluded.  11/24/19-59 year old male former smoker followed for COPD, hypoxic respiratory failure/ Tracheostomy/ Tracheal Stenosis, OSA, complicated by morbid obesity, OHS, DM2, CHF/ atherosclerosis, hx Thyroidectomy for goiter, COPD, Morbid Obesity,  (BIPAP 22/18/ [facility]/ [employer])- tracheostomy now Tracheostomy revision by Dr Constance Holster 8/11 ED 10/28 to replace dislodged trach tube     # 5 Extra Long Shiley Dulera 100, Neb Duoneb,  Body weight today- 333 lbs Covid vax- none Flu vax- had Follows with Tracheostomy [place] NP told him to expect to need trach another 6 months, which will be largely contingent on his ability to keep weight down.  Has old O2 concentrator, not using. Sleeps with trach open, humidified room air by trach mask. Does well during day with PM valve on trach tube. PICC line and feeding tube are out. Using [facility]. Rarely uses nebulizer- has meds.   ROS-see HPI    + = positive Constitutional:    +weight loss, night sweats, fevers, chills, + fatigue, lassitude. HEENT:   No-  headaches, difficulty swallowing, tooth/dental problems, sore throat,      sneezing, itching, ear ache, nasal congestion, post nasal drip,  CV:  No-   chest pain, +orthopnea, PND, swelling in lower extremities, no-anasarca, dizziness, palpitations Resp: +shortness of breath with exertion or at rest.                productive cough,  + non-productive cough,  No- coughing up of blood.              No-   change in color of mucus.   wheezing.   Skin: No-  rash or lesions. GI:  No-   heartburn, indigestion, abdominal pain,no- nausea, vomiting,  GU:  MS:  + joint pain or swelling.  . Neuro-     nothing unusual Psych:  No- change in mood or affect. No depression or anxiety.  No memory loss.  OBJ- Physical Exam General-  Alert, Oriented, Affect-appropriate, Distress- none acute,  + morbidly obese,   . 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 Lymphadenopathy- none Head- atraumatic            Eyes- Gross vision intact, PERRLA, conjunctivae and secretions clear            Ears- Hearing, canals-normal            Nose- Clear, no-Septal dev, mucus, polyps, erosion, perforation             Throat- Mallampati IV , mucosa clear , drainage- none, tonsils- atrophic,  Neck- + Breathing unlabored at rest on room air. +tracheostomy/ PMValve. Chest - symmetrical excursion , unlabored           Heart/CV- RRR, distant , no murmur , no gallop  , no rub, nl s1 s2                           - JVD- none , edema+ 1-2, stasis changes- none, varices- none           Lung- diminished/clear , wheeze- none, cough+raspy / stridorous , dullness-none, rub- none                                   Chest wall-  Abd-  Br/ Gen/ Rectal- Not done, not indicated Extrem- cyanosis- none, clubbing, none, atrophy- none, strength- nl, + cane Neuro- grossly intact to observation

## 2019-12-04 ENCOUNTER — Inpatient Hospital Stay (HOSPITAL_COMMUNITY)
Admission: EM | Admit: 2019-12-04 | Discharge: 2019-12-13 | DRG: 271 | Disposition: A | Discharge status: Home / Self Care | Payer: Medicaid Other | Attending: Internal Medicine | Admitting: Internal Medicine

## 2019-12-04 ENCOUNTER — Emergency Department (HOSPITAL_COMMUNITY): Payer: Medicaid Other

## 2019-12-04 ENCOUNTER — Other Ambulatory Visit: Payer: Self-pay

## 2019-12-04 ENCOUNTER — Encounter (HOSPITAL_COMMUNITY): Payer: Self-pay | Admitting: Family Medicine

## 2019-12-04 DIAGNOSIS — I5032 Chronic diastolic (congestive) heart failure: Secondary | ICD-10-CM | POA: Diagnosis not present

## 2019-12-04 DIAGNOSIS — E662 Morbid (severe) obesity with alveolar hypoventilation: Secondary | ICD-10-CM | POA: Diagnosis present

## 2019-12-04 DIAGNOSIS — E890 Postprocedural hypothyroidism: Secondary | ICD-10-CM | POA: Diagnosis present

## 2019-12-04 DIAGNOSIS — E785 Hyperlipidemia, unspecified: Secondary | ICD-10-CM | POA: Diagnosis present

## 2019-12-04 DIAGNOSIS — E119 Type 2 diabetes mellitus without complications: Secondary | ICD-10-CM | POA: Diagnosis present

## 2019-12-04 DIAGNOSIS — I82412 Acute embolism and thrombosis of left femoral vein: Principal | ICD-10-CM | POA: Diagnosis present

## 2019-12-04 DIAGNOSIS — R609 Edema, unspecified: Secondary | ICD-10-CM | POA: Diagnosis not present

## 2019-12-04 DIAGNOSIS — Z794 Long term (current) use of insulin: Secondary | ICD-10-CM

## 2019-12-04 DIAGNOSIS — J9612 Chronic respiratory failure with hypercapnia: Secondary | ICD-10-CM

## 2019-12-04 DIAGNOSIS — I824Y2 Acute embolism and thrombosis of unspecified deep veins of left proximal lower extremity: Secondary | ICD-10-CM

## 2019-12-04 DIAGNOSIS — I82432 Acute embolism and thrombosis of left popliteal vein: Secondary | ICD-10-CM | POA: Diagnosis present

## 2019-12-04 DIAGNOSIS — J9610 Chronic respiratory failure, unspecified whether with hypoxia or hypercapnia: Secondary | ICD-10-CM | POA: Diagnosis present

## 2019-12-04 DIAGNOSIS — Z87891 Personal history of nicotine dependence: Secondary | ICD-10-CM

## 2019-12-04 DIAGNOSIS — J9611 Chronic respiratory failure with hypoxia: Secondary | ICD-10-CM | POA: Diagnosis not present

## 2019-12-04 DIAGNOSIS — Z930 Tracheostomy status: Secondary | ICD-10-CM

## 2019-12-04 DIAGNOSIS — Z20822 Contact with and (suspected) exposure to covid-19: Secondary | ICD-10-CM | POA: Diagnosis present

## 2019-12-04 DIAGNOSIS — I10 Essential (primary) hypertension: Secondary | ICD-10-CM | POA: Diagnosis present

## 2019-12-04 DIAGNOSIS — F419 Anxiety disorder, unspecified: Secondary | ICD-10-CM | POA: Diagnosis present

## 2019-12-04 DIAGNOSIS — K219 Gastro-esophageal reflux disease without esophagitis: Secondary | ICD-10-CM | POA: Diagnosis present

## 2019-12-04 DIAGNOSIS — I824Z2 Acute embolism and thrombosis of unspecified deep veins of left distal lower extremity: Secondary | ICD-10-CM | POA: Diagnosis not present

## 2019-12-04 DIAGNOSIS — F418 Other specified anxiety disorders: Secondary | ICD-10-CM | POA: Diagnosis present

## 2019-12-04 DIAGNOSIS — I82220 Acute embolism and thrombosis of inferior vena cava: Secondary | ICD-10-CM | POA: Diagnosis present

## 2019-12-04 DIAGNOSIS — I82442 Acute embolism and thrombosis of left tibial vein: Secondary | ICD-10-CM | POA: Diagnosis present

## 2019-12-04 DIAGNOSIS — Z79899 Other long term (current) drug therapy: Secondary | ICD-10-CM

## 2019-12-04 DIAGNOSIS — E1100 Type 2 diabetes mellitus with hyperosmolarity without nonketotic hyperglycemic-hyperosmolar coma (NKHHC): Secondary | ICD-10-CM

## 2019-12-04 DIAGNOSIS — J449 Chronic obstructive pulmonary disease, unspecified: Secondary | ICD-10-CM | POA: Diagnosis present

## 2019-12-04 DIAGNOSIS — I110 Hypertensive heart disease with heart failure: Secondary | ICD-10-CM | POA: Diagnosis present

## 2019-12-04 DIAGNOSIS — G4733 Obstructive sleep apnea (adult) (pediatric): Secondary | ICD-10-CM | POA: Diagnosis present

## 2019-12-04 DIAGNOSIS — Z6841 Body Mass Index (BMI) 40.0 and over, adult: Secondary | ICD-10-CM

## 2019-12-04 DIAGNOSIS — M109 Gout, unspecified: Secondary | ICD-10-CM | POA: Diagnosis present

## 2019-12-04 DIAGNOSIS — Z7951 Long term (current) use of inhaled steroids: Secondary | ICD-10-CM

## 2019-12-04 DIAGNOSIS — I82402 Acute embolism and thrombosis of unspecified deep veins of left lower extremity: Secondary | ICD-10-CM | POA: Diagnosis present

## 2019-12-04 DIAGNOSIS — E1165 Type 2 diabetes mellitus with hyperglycemia: Secondary | ICD-10-CM

## 2019-12-04 LAB — CBC
HCT: 42.2 % (ref 39.0–52.0)
Hemoglobin: 12.8 g/dL — ABNORMAL LOW (ref 13.0–17.0)
MCH: 26.3 pg (ref 26.0–34.0)
MCHC: 30.3 g/dL (ref 30.0–36.0)
MCV: 86.8 fL (ref 80.0–100.0)
Platelets: 174 10*3/uL (ref 150–400)
RBC: 4.86 MIL/uL (ref 4.22–5.81)
RDW: 16 % — ABNORMAL HIGH (ref 11.5–15.5)
WBC: 11.4 10*3/uL — ABNORMAL HIGH (ref 4.0–10.5)
nRBC: 0 % (ref 0.0–0.2)

## 2019-12-04 LAB — BASIC METABOLIC PANEL
Anion gap: 15 (ref 5–15)
BUN: 15 mg/dL (ref 6–20)
CO2: 27 mmol/L (ref 22–32)
Calcium: 9.1 mg/dL (ref 8.9–10.3)
Chloride: 96 mmol/L — ABNORMAL LOW (ref 98–111)
Creatinine, Ser: 1.34 mg/dL — ABNORMAL HIGH (ref 0.61–1.24)
GFR, Estimated: >60 mL/min (ref >60)
Glucose, Bld: 279 mg/dL — ABNORMAL HIGH (ref 70–99)
Potassium: 3.8 mmol/L (ref 3.5–5.1)
Sodium: 138 mmol/L (ref 135–145)

## 2019-12-04 LAB — CBG MONITORING, ED: Glucose-Capillary: 160 mg/dL — ABNORMAL HIGH (ref 70–99)

## 2019-12-04 MED ORDER — SODIUM CHLORIDE 0.9 % IV SOLN: INTRAVENOUS | Status: DC

## 2019-12-04 MED ORDER — INSULIN ASPART 100 UNIT/ML ~~LOC~~ SOLN: 0.0000 [IU] | Freq: Every day | SUBCUTANEOUS | Status: DC

## 2019-12-04 MED ORDER — ACETAMINOPHEN 325 MG PO TABS
650.0000 mg | ORAL_TABLET | Freq: Four times a day (QID) | ORAL | Status: DC | PRN
  Administered 2019-12-10: 650 mg via ORAL
  Filled 2019-12-04: qty 2

## 2019-12-04 MED ORDER — PANTOPRAZOLE SODIUM 40 MG PO TBEC: 40.0000 mg | DELAYED_RELEASE_TABLET | Freq: Every day | ORAL | Status: DC

## 2019-12-04 MED ORDER — ONDANSETRON HCL 4 MG/2ML IJ SOLN: 4.0000 mg | Freq: Four times a day (QID) | INTRAMUSCULAR | Status: DC | PRN

## 2019-12-04 MED ORDER — IOHEXOL 350 MG/ML SOLN
75.0000 mL | Freq: Once | INTRAVENOUS | Status: AC | PRN
  Administered 2019-12-04: 75 mL via INTRAVENOUS

## 2019-12-04 MED ORDER — GABAPENTIN 100 MG PO CAPS
200.0000 mg | ORAL_CAPSULE | Freq: Three times a day (TID) | ORAL | Status: DC
  Administered 2019-12-04 – 2019-12-13 (×25): 200 mg via ORAL
  Filled 2019-12-04 (×25): qty 2

## 2019-12-04 MED ORDER — INSULIN ASPART 100 UNIT/ML ~~LOC~~ SOLN
0.0000 [IU] | Freq: Three times a day (TID) | SUBCUTANEOUS | Status: DC
  Administered 2019-12-05: 2 [IU] via SUBCUTANEOUS
  Administered 2019-12-05: 1 [IU] via SUBCUTANEOUS
  Administered 2019-12-05 – 2019-12-06 (×2): 2 [IU] via SUBCUTANEOUS
  Administered 2019-12-06: 3 [IU] via SUBCUTANEOUS
  Administered 2019-12-06 – 2019-12-10 (×8): 1 [IU] via SUBCUTANEOUS
  Administered 2019-12-10: 3 [IU] via SUBCUTANEOUS
  Administered 2019-12-10 – 2019-12-11 (×3): 1 [IU] via SUBCUTANEOUS
  Administered 2019-12-12: 2 [IU] via SUBCUTANEOUS
  Administered 2019-12-12 – 2019-12-13 (×3): 1 [IU] via SUBCUTANEOUS

## 2019-12-04 MED ORDER — IPRATROPIUM-ALBUTEROL 0.5-2.5 (3) MG/3ML IN SOLN: 3.0000 mL | Freq: Four times a day (QID) | RESPIRATORY_TRACT | Status: DC | PRN

## 2019-12-04 MED ORDER — SERTRALINE HCL 100 MG PO TABS
100.0000 mg | ORAL_TABLET | Freq: Every day | ORAL | Status: DC
  Administered 2019-12-05 – 2019-12-13 (×8): 100 mg via ORAL
  Filled 2019-12-04 (×8): qty 1

## 2019-12-04 MED ORDER — METOPROLOL TARTRATE 12.5 MG HALF TABLET
12.5000 mg | ORAL_TABLET | Freq: Two times a day (BID) | ORAL | Status: DC
  Administered 2019-12-04 – 2019-12-07 (×7): 12.5 mg via ORAL
  Filled 2019-12-04 (×8): qty 1

## 2019-12-04 MED ORDER — TRAZODONE HCL 50 MG PO TABS
50.0000 mg | ORAL_TABLET | Freq: Every evening | ORAL | Status: DC | PRN
  Administered 2019-12-04 – 2019-12-11 (×2): 50 mg via ORAL
  Filled 2019-12-04 (×2): qty 1

## 2019-12-04 MED ORDER — DICLOFENAC SODIUM 1 % EX GEL
2.0000 g | Freq: Four times a day (QID) | CUTANEOUS | Status: DC
  Administered 2019-12-05 – 2019-12-11 (×19): 2 g via TOPICAL
  Filled 2019-12-04 (×3): qty 100

## 2019-12-04 MED ORDER — PANTOPRAZOLE SODIUM 40 MG PO TBEC
40.0000 mg | DELAYED_RELEASE_TABLET | Freq: Every day | ORAL | Status: DC
  Administered 2019-12-04 – 2019-12-12 (×9): 40 mg via ORAL
  Filled 2019-12-04 (×9): qty 1

## 2019-12-04 MED ORDER — HEPARIN (PORCINE) 25000 UT/250ML-% IV SOLN
2400.0000 [IU]/h | INTRAVENOUS | Status: DC
  Administered 2019-12-04: 1700 [IU]/h via INTRAVENOUS
  Administered 2019-12-06 – 2019-12-07 (×3): 1900 [IU]/h via INTRAVENOUS
  Administered 2019-12-08: 2000 [IU]/h via INTRAVENOUS
  Administered 2019-12-09 (×2): 2300 [IU]/h via INTRAVENOUS
  Administered 2019-12-10: 2400 [IU]/h via INTRAVENOUS
  Administered 2019-12-10: 2500 [IU]/h via INTRAVENOUS
  Administered 2019-12-11: 2000 [IU]/h via INTRAVENOUS
  Filled 2019-12-04 (×12): qty 250

## 2019-12-04 MED ORDER — BISACODYL 5 MG PO TBEC: 5.0000 mg | DELAYED_RELEASE_TABLET | Freq: Every day | ORAL | Status: DC | PRN

## 2019-12-04 MED ORDER — OXYCODONE HCL 5 MG PO TABS
5.0000 mg | ORAL_TABLET | ORAL | Status: DC | PRN
  Administered 2019-12-05 – 2019-12-12 (×15): 5 mg via ORAL
  Filled 2019-12-04 (×15): qty 1

## 2019-12-04 MED ORDER — QUETIAPINE FUMARATE 50 MG PO TABS
100.0000 mg | ORAL_TABLET | Freq: Two times a day (BID) | ORAL | Status: DC
  Administered 2019-12-04 – 2019-12-13 (×17): 100 mg via ORAL
  Filled 2019-12-04 (×2): qty 1
  Filled 2019-12-04 (×2): qty 2
  Filled 2019-12-04 (×8): qty 1
  Filled 2019-12-04: qty 2
  Filled 2019-12-04: qty 1
  Filled 2019-12-04 (×2): qty 2
  Filled 2019-12-04: qty 1
  Filled 2019-12-04: qty 2

## 2019-12-04 MED ORDER — ATORVASTATIN CALCIUM 10 MG PO TABS
10.0000 mg | ORAL_TABLET | Freq: Every day | ORAL | Status: DC
  Administered 2019-12-05 – 2019-12-13 (×8): 10 mg via ORAL
  Filled 2019-12-04 (×8): qty 1

## 2019-12-04 MED ORDER — ACETAMINOPHEN 650 MG RE SUPP
650.0000 mg | Freq: Four times a day (QID) | RECTAL | Status: DC | PRN
  Filled 2019-12-04: qty 1

## 2019-12-04 MED ORDER — HEPARIN BOLUS VIA INFUSION
7000.0000 [IU] | Freq: Once | INTRAVENOUS | Status: AC
  Administered 2019-12-04: 7000 [IU] via INTRAVENOUS
  Filled 2019-12-04: qty 7000

## 2019-12-04 MED ORDER — ALLOPURINOL 100 MG PO TABS
100.0000 mg | ORAL_TABLET | Freq: Every day | ORAL | Status: DC
  Administered 2019-12-05 – 2019-12-13 (×8): 100 mg via ORAL
  Filled 2019-12-04 (×8): qty 1

## 2019-12-04 MED ORDER — ONDANSETRON HCL 4 MG PO TABS: 4.0000 mg | ORAL_TABLET | Freq: Four times a day (QID) | ORAL | Status: DC | PRN

## 2019-12-04 MED ORDER — INSULIN DETEMIR 100 UNIT/ML ~~LOC~~ SOLN
17.0000 [IU] | Freq: Two times a day (BID) | SUBCUTANEOUS | Status: DC
  Administered 2019-12-04 – 2019-12-13 (×18): 17 [IU] via SUBCUTANEOUS
  Filled 2019-12-04 (×20): qty 0.17

## 2019-12-04 MED ORDER — MOMETASONE FURO-FORMOTEROL FUM 100-5 MCG/ACT IN AERO
2.0000 | INHALATION_SPRAY | Freq: Two times a day (BID) | RESPIRATORY_TRACT | Status: DC
  Administered 2019-12-04 – 2019-12-13 (×16): 2 via RESPIRATORY_TRACT
  Filled 2019-12-04: qty 8.8

## 2019-12-04 MED ORDER — POLYETHYLENE GLYCOL 3350 17 G PO PACK: 17.0000 g | PACK | Freq: Every day | ORAL | Status: DC | PRN

## 2019-12-04 NOTE — ED Provider Notes (Signed)
[facility] EMERGENCY DEPARTMENT Provider Note  CSN: [id number] Arrival date & time: 12/04/19 1707    History No chief complaint on file.   HPI  Timothy Le is a 59 y.o. male with history of COPD, chronic respiratory failure s/p tracheostomy in July of this year reports several days of increasing LLE pain and swelling, unable to get out of bed due to pain and concerns he might fall. He has chronic SOB but no chest pain and no increased SOB from baseline. He denies any fever. No recently falls or injuries.    Past Medical History:  Diagnosis Date  . Asthma   . Chronic respiratory failure with hypoxia ([place]) 07/09/2017  . COPD (chronic obstructive pulmonary disease) ([place])   . Diabetes mellitus without complication ([place])   . Difficult intubation   . Hypertension   . Shortness of breath dyspnea   . Sleep apnea     Past Surgical History:  Procedure Laterality Date  . IR GASTROSTOMY TUBE MOD SED  07/05/2017  . THYROIDECTOMY Left 08/30/2019   Procedure: THYROIDECTOMY CERVICAL LIPECTOMY;  Surgeon: Izora Gala, MD;  Location: [facility];  Service: ENT;  Laterality: Left;  NEEDS RNFA PLEASE  . TRACHEOSTOMY TUBE PLACEMENT N/A 06/17/2017   Procedure: TRACHEOSTOMY;  Surgeon: Leta Baptist, MD;  Location: [facility];  Service: ENT;  Laterality: N/A;  . TRACHEOSTOMY TUBE PLACEMENT N/A 08/14/2019   Procedure: TRACHEOSTOMY;  Surgeon: Izora Gala, MD;  Location: [facility];  Service: ENT;  Laterality: N/A;  . TRACHEOSTOMY TUBE PLACEMENT N/A 08/30/2019   Procedure: TRACHEOSTOMY  REVISION;  Surgeon: Izora Gala, MD;  Location: [facility];  Service: ENT;  Laterality: N/A;    Family History  Problem Relation Age of Onset  . Asthma Sister   . Asthma Other        nephew  . Hypertension Sister   . Diabetes type II Sister     Social History   Tobacco Use  . Smoking status: Former Smoker    Packs/day: 0.50    Years: 0.50    Pack years: 0.25    Types: Cigarettes    Start date: 07/28/1983    Quit date:  02/27/2013    Years since quitting: 6.7  . Smokeless tobacco: Never Used  Vaping Use  . Vaping Use: Never used  Substance Use Topics  . Alcohol use: No    Comment: quit ETOH about 5 months ago-beer and liquor  . Drug use: No    Comment: quit 6 months-"pot"-smoked couple joints a week     Home Medications Prior to Admission medications   Medication Sig Start Date End Date Taking? Authorizing Provider  acetaminophen (TYLENOL) 325 MG tablet Take 1-2 tablets (325-650 mg total) by mouth every 4 (four) hours as needed for mild pain. 09/26/19   Love, Ivan Anchors, PA-C  allopurinol (ZYLOPRIM) 100 MG tablet Take 1 tablet (100 mg total) by mouth daily. 10/12/19 10/11/20  Vevelyn Francois, NP  alum & mag hydroxide-simeth (MAALOX/MYLANTA) 200-200-20 MG/5ML suspension Take 30 mLs by mouth every 4 (four) hours as needed for indigestion, heartburn or flatulence. 09/15/19   Samella Parr, NP  atorvastatin (LIPITOR) 10 MG tablet Take 1 tablet (10 mg total) by mouth daily. 10/12/19 10/11/20  Vevelyn Francois, NP  dextromethorphan-guaiFENesin (MUCINEX DM) 30-600 MG 12hr tablet Take 1 tablet by mouth 2 (two) times daily. 09/15/19   Samella Parr, NP  diclofenac Sodium (VOLTAREN) 1 % GEL Apply 2 g topically 4 (four) times daily.  10/12/19   Vevelyn Francois, NP  gabapentin (NEURONTIN) 100 MG capsule Take 2 capsules (200 mg total) by mouth 3 (three) times daily. 10/12/19 10/11/20  Vevelyn Francois, NP  insulin detemir (LEVEMIR) 100 UNIT/ML injection Inject 0.33 mLs (33 Units total) into the skin every 12 (twelve) hours. 10/12/19   Vevelyn Francois, NP  ipratropium-albuterol (DUONEB) 0.5-2.5 (3) MG/3ML SOLN Take 3 mLs by nebulization every 6 (six) hours as needed. 10/12/19 10/11/20  Vevelyn Francois, NP  metoprolol tartrate (LOPRESSOR) 25 MG tablet Take 0.5 tablets (12.5 mg total) by mouth 2 (two) times daily. 10/12/19 10/11/20  Vevelyn Francois, NP  mometasone-formoterol (DULERA) 100-5 MCG/ACT AERO Inhale 2 puffs into the lungs 2  (two) times daily. 10/12/19   Vevelyn Francois, NP  Multiple Vitamin (MULTIVITAMIN WITH MINERALS) TABS tablet Take 1 tablet by mouth daily. 09/16/19   Samella Parr, NP  nutrition supplement, JUVEN, (JUVEN) PACK Take 1 packet by mouth 2 (two) times daily between meals. 09/26/19   Love, Ivan Anchors, PA-C  Oxycodone HCl 10 MG TABS Take 0.5 tablets (5 mg total) by mouth daily as needed (severe pain). 09/26/19   Love, Ivan Anchors, PA-C  pantoprazole (PROTONIX) 40 MG tablet Take 1 tablet (40 mg total) by mouth at bedtime. 10/12/19 10/11/20  Vevelyn Francois, NP  potassium chloride SA (KLOR-CON) 20 MEQ tablet Take 1 tablet (20 mEq total) by mouth 2 (two) times daily. 10/12/19 10/11/20  Vevelyn Francois, NP  QUEtiapine (SEROQUEL) 100 MG tablet Take 1 tablet (100 mg total) by mouth 2 (two) times daily. 10/12/19 10/11/20  Vevelyn Francois, NP  senna-docusate (SENOKOT-S) 8.6-50 MG tablet Take 2 tablets by mouth 2 (two) times daily. 10/12/19   Vevelyn Francois, NP  sertraline (ZOLOFT) 100 MG tablet Take 1 tablet (100 mg total) by mouth daily. 10/12/19 10/11/20  Vevelyn Francois, NP  torsemide (DEMADEX) 10 MG tablet Take 1 tablet (10 mg total) by mouth daily. 10/12/19 10/11/20  Vevelyn Francois, NP  traZODone (DESYREL) 50 MG tablet Take 0.5-1 tablets (25-50 mg total) by mouth at bedtime as needed for sleep. 10/12/19 10/11/20  Vevelyn Francois, NP     Allergies    Patient has no known allergies.   Review of Systems   Review of Systems A comprehensive review of systems was completed and negative except as noted in HPI.    Physical Exam BP (!) 149/80   Pulse 86   Temp 98.6 F (37 C) (Oral)   Resp 14   Ht 5\' 8"  (1.727 m)   Wt (!) 151.1 kg   SpO2 100%   BMI 50.66 kg/m   Physical Exam Vitals and nursing note reviewed.  Constitutional:      Appearance: Normal appearance.  HENT:     Head: Normocephalic and atraumatic.     Nose: Nose normal.     Mouth/Throat:     Mouth: Mucous membranes are moist.  Eyes:     Extraocular  Movements: Extraocular movements intact.     Conjunctiva/sclera: Conjunctivae normal.  Neck:     Comments: Trach in place Cardiovascular:     Rate and Rhythm: Normal rate.  Pulmonary:     Effort: Pulmonary effort is normal.     Breath sounds: Normal breath sounds.  Abdominal:     General: Abdomen is flat.     Palpations: Abdomen is soft.     Tenderness: There is no abdominal tenderness.  Musculoskeletal:  General: No swelling. Normal range of motion.     Cervical back: Neck supple.     Comments: LLE edema, swollen compared to RLE, tender to palpation of calf and medial thigh.   Skin:    General: Skin is warm and dry.  Neurological:     General: No focal deficit present.     Mental Status: He is alert.  Psychiatric:        Mood and Affect: Mood normal.      ED Results / Procedures / Treatments   Labs (all labs ordered are listed, but only abnormal results are displayed) Labs Reviewed  BASIC METABOLIC PANEL - Abnormal; Notable for the following components:      Result Value   Chloride 96 (*)    Glucose, Bld 279 (*)    Creatinine, Ser 1.34 (*)    All other components within normal limits  CBC - Abnormal; Notable for the following components:   WBC 11.4 (*)    Hemoglobin 12.8 (*)    RDW 16.0 (*)    All other components within normal limits  HEPARIN LEVEL (UNFRACTIONATED)  CBC  BASIC METABOLIC PANEL    EKG None  Radiology CT Angio Chest PE W/Cm &/Or Wo Cm  Result Date: 12/04/2019 CLINICAL DATA:  59 year old male with concern for pulmonary embolism. EXAM: CT ANGIOGRAPHY CHEST WITH CONTRAST TECHNIQUE: Multidetector CT imaging of the chest was performed using the standard protocol during bolus administration of intravenous contrast. Multiplanar CT image reconstructions and MIPs were obtained to evaluate the vascular anatomy. CONTRAST:  53mL OMNIPAQUE IOHEXOL 350 MG/ML SOLN COMPARISON:  Chest CT dated 07/05/2018 and radiograph dated 09/19/2019. FINDINGS:  Cardiovascular: There is no cardiomegaly or pericardial effusion. The thoracic aorta is unremarkable. The origins of the great vessels of the aortic arch appear patent as visualized. There is no CT evidence of pulmonary embolism. Mediastinum/Nodes: There is no hilar or mediastinal adenopathy. The esophagus is grossly unremarkable. Lungs/Pleura: There are bibasilar linear atelectasis/scarring. No focal consolidation, pleural effusion, pneumothorax. The central airways are patent. Tracheostomy above the carina. Upper Abdomen: No acute abnormality. Musculoskeletal: Degenerative changes of the spine. Anterior spinal osteophyte compatible with DISH. No acute osseous pathology. Review of the MIP images confirms the above findings. IMPRESSION: No acute intrathoracic pathology. No CT evidence of pulmonary embolism. Electronically Signed   By: Anner Crete M.D.   On: 12/04/2019 19:56   VAS [place] LOWER EXTREMITY VENOUS (DVT) (ONLY [facility] & [facility])  Result Date: 12/04/2019  Lower Venous DVT Study Indications: Edema.  Limitations: Body habitus and poor ultrasound/tissue interface. Comparison Study: 07/31/19 previous Performing Technologist: Abram Sander RVS  Examination Guidelines: A complete evaluation includes B-mode imaging, spectral Doppler, color Doppler, and power Doppler as needed of all accessible portions of each vessel. Bilateral testing is considered an integral part of a complete examination. Limited examinations for reoccurring indications may be performed as noted. The reflux portion of the exam is performed with the patient in reverse Trendelenburg.  +-----+---------------+---------+-----------+----------+--------------+ RIGHTCompressibilityPhasicitySpontaneityPropertiesThrombus Aging +-----+---------------+---------+-----------+----------+--------------+ CFV  Full           Yes      Yes                                 +-----+---------------+---------+-----------+----------+--------------+    +---------+---------------+---------+-----------+----------+-------------------+ LEFT     CompressibilityPhasicitySpontaneityPropertiesThrombus Aging      +---------+---------------+---------+-----------+----------+-------------------+ CFV      None  No       No                   Age Indeterminate   +---------+---------------+---------+-----------+----------+-------------------+ SFJ      None                                         Age Indeterminate   +---------+---------------+---------+-----------+----------+-------------------+ FV Prox  None                                         Age Indeterminate   +---------+---------------+---------+-----------+----------+-------------------+ FV Mid   None                                         Age Indeterminate   +---------+---------------+---------+-----------+----------+-------------------+ FV Distal               No       No                   Age Indeterminate   +---------+---------------+---------+-----------+----------+-------------------+ PFV      None                                         Age Indeterminate   +---------+---------------+---------+-----------+----------+-------------------+ POP      None           No       No                   Age Indeterminate   +---------+---------------+---------+-----------+----------+-------------------+ PTV      None                                         Age Indeterminate   +---------+---------------+---------+-----------+----------+-------------------+ PERO                                                  Not well visualized +---------+---------------+---------+-----------+----------+-------------------+ EIV                                                   Not visualized      +---------+---------------+---------+-----------+----------+-------------------+     Summary: RIGHT: - No evidence of common femoral vein obstruction.  LEFT: -  Findings consistent with age indeterminate deep vein thrombosis involving the left common femoral vein, SF junction, left femoral vein, left proximal profunda vein, left popliteal vein, and left posterior tibial veins. - No cystic structure found in the popliteal fossa.  *See table(s) above for measurements and observations.    Preliminary     Procedures Procedures  Medications Ordered in the ED Medications  heparin ADULT infusion 100 units/mL (25000 units/218mL sodium chloride 0.45%) (1,700 Units/hr Intravenous New Bag/Given 12/04/19 2100)  0.9 %  sodium  chloride infusion (has no administration in time range)  acetaminophen (TYLENOL) tablet 650 mg (has no administration in time range)    Or  acetaminophen (TYLENOL) suppository 650 mg (has no administration in time range)  oxyCODONE (Oxy IR/ROXICODONE) immediate release tablet 5 mg (has no administration in time range)  polyethylene glycol (MIRALAX / GLYCOLAX) packet 17 g (has no administration in time range)  ondansetron (ZOFRAN) tablet 4 mg (has no administration in time range)    Or  ondansetron (ZOFRAN) injection 4 mg (has no administration in time range)  bisacodyl (DULCOLAX) EC tablet 5 mg (has no administration in time range)  traZODone (DESYREL) tablet 50 mg (has no administration in time range)  pantoprazole (PROTONIX) EC tablet 40 mg (has no administration in time range)  insulin aspart (novoLOG) injection 0-9 Units (has no administration in time range)  insulin aspart (novoLOG) injection 0-5 Units (has no administration in time range)  insulin detemir (LEVEMIR) injection 17 Units (has no administration in time range)  iohexol (OMNIPAQUE) 350 MG/ML injection 75 mL (75 mLs Intravenous Contrast Given 12/04/19 1930)  heparin bolus via infusion 7,000 Units (7,000 Units Intravenous Bolus from Bag 12/04/19 2100)     MDM Rules/Calculators/A&P MDM Patient with extensive DVT in LLE, CTA neg for PE. Given his degree of clot burden,  extensive history of other medical problems and high risk for complication, will initiate heparin drip and discuss admission with hospitalist.   ED Course  I have reviewed the triage vital signs and the nursing notes.  Pertinent labs & imaging results that were available during my care of the patient were reviewed by me and considered in my medical decision making (see chart for details).  Clinical Course as of Dec 04 2131  Mon Dec 04, 2019  2107 Spoke with Dr. Myna Hidalgo, Hospitalist, who will evaluate for admission.    [CS]    Clinical Course User Index [CS] Truddie Hidden, MD    Final Clinical Impression(s) / ED Diagnoses Final diagnoses:  Deep vein thrombosis (DVT) of proximal vein of left lower extremity, unspecified chronicity [facility])    Rx / DC Orders ED Discharge Orders    None       Truddie Hidden, MD 12/04/19 2133

## 2019-12-04 NOTE — Progress Notes (Signed)
ANTICOAGULATION CONSULT NOTE - Initial Consult  Pharmacy Consult for Heparin Indication: DVT  No Known Allergies  Patient Measurements: Height: 5\' 8"  (172.7 cm) Weight: (!) 151.1 kg (333 lb 3.2 oz) IBW/kg (Calculated) : 68.4 Heparin Dosing Weight: 105.2 kg  Vital Signs: Temp: 98.6 F (37 C) (11/15 1713) Temp Source: Oral (11/15 1713) BP: 141/69 (11/15 2030) Pulse Rate: 98 (11/15 2030)  Labs: Recent Labs    12/04/19 1722  HGB 12.8*  HCT 42.2  PLT 174  CREATININE 1.34*    Estimated Creatinine Clearance: 86.3 mL/min (A) (by C-G formula based on SCr of 1.34 mg/dL (H)).   Medical History: Past Medical History:  Diagnosis Date  . Asthma   . Chronic respiratory failure with hypoxia ([place]) 07/09/2017  . COPD (chronic obstructive pulmonary disease) ([place])   . Diabetes mellitus without complication ([place])   . Difficult intubation   . Hypertension   . Shortness of breath dyspnea   . Sleep apnea    Assessment: 59 y.o. male found to have DVT. Pharmacy consulted to dose heparin.   Goal of Therapy:  Heparin level 0.3-0.7 units/ml Monitor platelets by anticoagulation protocol: Yes   Plan:  Give 7000 unit bolus x1 Start heparin infusion at 1700 units/hr 8 h heparin level Daily heparin level, CBC Monitor s/sx bleeding  Rebbeca Paul, PharmD PGY1 Pharmacy Resident 12/04/2019 8:41 PM  Please check [email or web address] for unit-specific pharmacy phone numbers.

## 2019-12-04 NOTE — Progress Notes (Signed)
Lower extremity venous has been completed.   Preliminary results in CV Proc.   Abram Sander 12/04/2019 7:02 PM

## 2019-12-04 NOTE — H&P (Signed)
History and Physical    Timothy Le YCX:[id number] DOB: November 08, 1960 DOA: 12/04/2019  PCP: Vevelyn Francois, NP   Patient coming from: Home   Chief Complaint: Left leg pain and swelling   HPI: Timothy Le is a 59 y.o. male with medical history significant for COPD, BMI 51, OSA, OHS, tracheostomy dependence, chronic respiratory failure, insulin-dependent diabetes mellitus, chronic diastolic CHF, now presenting to the emergency department for evaluation of left leg pain and swelling.  Patient was admitted to the hospital over the summer with acute respiratory failure, required intubation, and tracheostomy placed, eventually went to inpatient rehab, and has now been home for approximately 2 months and reports several days of progressive pain, swelling, and redness involving the left leg.  Pain has become severe, worse with any movement, and has prevented him from sleeping for the past couple days.  He denies any change in his chronic dyspnea.  Denies any fevers, chills, chest pain, or hemoptysis.  ED Course: Upon arrival to the ED, patient is found to be afebrile, saturating in the mid 90s on room air, slightly tachypneic, slightly tachycardic, and with stable blood pressure.  Chemistry panel is notable for creatinine 1.34 and glucose 279.  CBC with slight leukocytosis.  CTA chest is negative for PE or other acute intrathoracic abnormality.  Lower extremity ultrasound reveals age-indeterminate DVT throughout the left leg.  Given the patient's significant comorbidity and high risk for complications, he was started on IV heparin in the emergency department and hospitalist were consulted for admission.  Review of Systems:  All other systems reviewed and apart from HPI, are negative.  Past Medical History:  Diagnosis Date  . Asthma   . Chronic respiratory failure with hypoxia ([place]) 07/09/2017  . COPD (chronic obstructive pulmonary disease) ([place])   . Diabetes mellitus without complication ([place])    . Difficult intubation   . Hypertension   . Shortness of breath dyspnea   . Sleep apnea     Past Surgical History:  Procedure Laterality Date  . IR GASTROSTOMY TUBE MOD SED  07/05/2017  . THYROIDECTOMY Left 08/30/2019   Procedure: THYROIDECTOMY CERVICAL LIPECTOMY;  Surgeon: Izora Gala, MD;  Location: [facility];  Service: ENT;  Laterality: Left;  NEEDS RNFA PLEASE  . TRACHEOSTOMY TUBE PLACEMENT N/A 06/17/2017   Procedure: TRACHEOSTOMY;  Surgeon: Leta Baptist, MD;  Location: [facility];  Service: ENT;  Laterality: N/A;  . TRACHEOSTOMY TUBE PLACEMENT N/A 08/14/2019   Procedure: TRACHEOSTOMY;  Surgeon: Izora Gala, MD;  Location: [facility];  Service: ENT;  Laterality: N/A;  . TRACHEOSTOMY TUBE PLACEMENT N/A 08/30/2019   Procedure: TRACHEOSTOMY  REVISION;  Surgeon: Izora Gala, MD;  Location: [place];  Service: ENT;  Laterality: N/A;    Social History:   reports that he quit smoking about 6 years ago. His smoking use included cigarettes. He started smoking about 36 years ago. He has a 0.25 pack-year smoking history. He has never used smokeless tobacco. He reports that he does not drink alcohol and does not use drugs.  No Known Allergies  Family History  Problem Relation Age of Onset  . Asthma Sister   . Asthma Other        nephew  . Hypertension Sister   . Diabetes type II Sister      Prior to Admission medications   Medication Sig Start Date End Date Taking? Authorizing Provider  acetaminophen (TYLENOL) 325 MG tablet Take 1-2 tablets (325-650 mg total) by mouth every 4 (four) hours as  needed for mild pain. 09/26/19   Love, Ivan Anchors, PA-C  allopurinol (ZYLOPRIM) 100 MG tablet Take 1 tablet (100 mg total) by mouth daily. 10/12/19 10/11/20  Vevelyn Francois, NP  alum & mag hydroxide-simeth (MAALOX/MYLANTA) 200-200-20 MG/5ML suspension Take 30 mLs by mouth every 4 (four) hours as needed for indigestion, heartburn or flatulence. 09/15/19   Samella Parr, NP  atorvastatin (LIPITOR) 10 MG tablet Take 1 tablet  (10 mg total) by mouth daily. 10/12/19 10/11/20  Vevelyn Francois, NP  dextromethorphan-guaiFENesin (MUCINEX DM) 30-600 MG 12hr tablet Take 1 tablet by mouth 2 (two) times daily. 09/15/19   Samella Parr, NP  diclofenac Sodium (VOLTAREN) 1 % GEL Apply 2 g topically 4 (four) times daily. 10/12/19   Vevelyn Francois, NP  gabapentin (NEURONTIN) 100 MG capsule Take 2 capsules (200 mg total) by mouth 3 (three) times daily. 10/12/19 10/11/20  Vevelyn Francois, NP  insulin detemir (LEVEMIR) 100 UNIT/ML injection Inject 0.33 mLs (33 Units total) into the skin every 12 (twelve) hours. 10/12/19   Vevelyn Francois, NP  ipratropium-albuterol (DUONEB) 0.5-2.5 (3) MG/3ML SOLN Take 3 mLs by nebulization every 6 (six) hours as needed. 10/12/19 10/11/20  Vevelyn Francois, NP  metoprolol tartrate (LOPRESSOR) 25 MG tablet Take 0.5 tablets (12.5 mg total) by mouth 2 (two) times daily. 10/12/19 10/11/20  Vevelyn Francois, NP  mometasone-formoterol (DULERA) 100-5 MCG/ACT AERO Inhale 2 puffs into the lungs 2 (two) times daily. 10/12/19   Vevelyn Francois, NP  Multiple Vitamin (MULTIVITAMIN WITH MINERALS) TABS tablet Take 1 tablet by mouth daily. 09/16/19   Samella Parr, NP  nutrition supplement, JUVEN, (JUVEN) PACK Take 1 packet by mouth 2 (two) times daily between meals. 09/26/19   Love, Ivan Anchors, PA-C  Oxycodone HCl 10 MG TABS Take 0.5 tablets (5 mg total) by mouth daily as needed (severe pain). 09/26/19   Love, Ivan Anchors, PA-C  pantoprazole (PROTONIX) 40 MG tablet Take 1 tablet (40 mg total) by mouth at bedtime. 10/12/19 10/11/20  Vevelyn Francois, NP  potassium chloride SA (KLOR-CON) 20 MEQ tablet Take 1 tablet (20 mEq total) by mouth 2 (two) times daily. 10/12/19 10/11/20  Vevelyn Francois, NP  QUEtiapine (SEROQUEL) 100 MG tablet Take 1 tablet (100 mg total) by mouth 2 (two) times daily. 10/12/19 10/11/20  Vevelyn Francois, NP  senna-docusate (SENOKOT-S) 8.6-50 MG tablet Take 2 tablets by mouth 2 (two) times daily. 10/12/19   Vevelyn Francois, NP    sertraline (ZOLOFT) 100 MG tablet Take 1 tablet (100 mg total) by mouth daily. 10/12/19 10/11/20  Vevelyn Francois, NP  torsemide (DEMADEX) 10 MG tablet Take 1 tablet (10 mg total) by mouth daily. 10/12/19 10/11/20  Vevelyn Francois, NP  traZODone (DESYREL) 50 MG tablet Take 0.5-1 tablets (25-50 mg total) by mouth at bedtime as needed for sleep. 10/12/19 10/11/20  Vevelyn Francois, NP    Physical Exam: Vitals:   12/04/19 1713 12/04/19 2000 12/04/19 2030 12/04/19 2045  BP: 130/71  (!) 141/69 (!) 149/80  Pulse: (!) 119  98 86  Resp: 20  (!) 22 14  Temp: 98.6 F (37 C)     TempSrc: Oral     SpO2: 95%  99% 100%  Weight:  (!) 151.1 kg    Height:  5\' 8"  (1.727 m)      Constitutional: NAD, calm  Eyes: PERTLA, lids and conjunctivae normal ENMT: Mucous membranes are moist. Posterior pharynx clear of any  exudate or lesions.   Neck: tracheostomy, no masses, no thyromegaly Respiratory: Diminished breath sounds bilaterally, no wheezing, no crackles. No pallor or cyanosis.  Cardiovascular: S1 & S2 heard, regular rate and rhythm. Left leg swelling.  Abdomen: No distension, no tenderness, soft. Bowel sounds active.  Musculoskeletal: no clubbing / cyanosis. No joint deformity upper and lower extremities.   Skin: LLE erythematous, tender, and warm. Warm, dry, well-perfused. Neurologic: No gross facial asymmetry. Sensation intact. Moving all extremities.  Psychiatric: Alert and oriented to person, place, and situation. Very pleasant and cooperative.    Labs and Imaging on Admission: I have personally reviewed following labs and imaging studies  CBC: Recent Labs  Lab 12/04/19 1722  WBC 11.4*  HGB 12.8*  HCT 42.2  MCV 86.8  PLT 093   Basic Metabolic Panel: Recent Labs  Lab 12/04/19 1722  NA 138  K 3.8  CL 96*  CO2 27  GLUCOSE 279*  BUN 15  CREATININE 1.34*  CALCIUM 9.1   GFR: Estimated Creatinine Clearance: 86.3 mL/min (A) (by C-G formula based on SCr of 1.34 mg/dL (H)). Liver Function  Tests: No results for input(s): AST, ALT, ALKPHOS, BILITOT, PROT, ALBUMIN in the last 168 hours. No results for input(s): LIPASE, AMYLASE in the last 168 hours. No results for input(s): AMMONIA in the last 168 hours. Coagulation Profile: No results for input(s): INR, PROTIME in the last 168 hours. Cardiac Enzymes: No results for input(s): CKTOTAL, CKMB, CKMBINDEX, TROPONINI in the last 168 hours. BNP (last 3 results) No results for input(s): PROBNP in the last 8760 hours. HbA1C: No results for input(s): HGBA1C in the last 72 hours. CBG: No results for input(s): GLUCAP in the last 168 hours. Lipid Profile: No results for input(s): CHOL, HDL, LDLCALC, TRIG, CHOLHDL, LDLDIRECT in the last 72 hours. Thyroid Function Tests: No results for input(s): TSH, T4TOTAL, FREET4, T3FREE, THYROIDAB in the last 72 hours. Anemia Panel: No results for input(s): VITAMINB12, FOLATE, FERRITIN, TIBC, IRON, RETICCTPCT in the last 72 hours. Urine analysis:    Component Value Date/Time   COLORURINE YELLOW 07/30/2019 1743   APPEARANCEUR HAZY (A) 07/30/2019 1743   LABSPEC 1.026 07/30/2019 1743   PHURINE 5.0 07/30/2019 1743   GLUCOSEU >=500 (A) 07/30/2019 1743   HGBUR LARGE (A) 07/30/2019 1743   BILIRUBINUR negative 10/12/2019 0951   KETONESUR NEGATIVE 07/30/2019 1743   PROTEINUR Negative 10/12/2019 0951   PROTEINUR 100 (A) 07/30/2019 1743   UROBILINOGEN 0.2 10/12/2019 0951   NITRITE negative 10/12/2019 0951   NITRITE NEGATIVE 07/30/2019 1743   LEUKOCYTESUR Negative 10/12/2019 0951   LEUKOCYTESUR NEGATIVE 07/30/2019 1743   Sepsis Labs: @LABRCNTIP (procalcitonin:4,lacticidven:4) )No results found for this or any previous visit (from the past 240 hour(s)).   Radiological Exams on Admission: CT Angio Chest PE W/Cm &/Or Wo Cm  Result Date: 12/04/2019 CLINICAL DATA:  59 year old male with concern for pulmonary embolism. EXAM: CT ANGIOGRAPHY CHEST WITH CONTRAST TECHNIQUE: Multidetector CT imaging of the  chest was performed using the standard protocol during bolus administration of intravenous contrast. Multiplanar CT image reconstructions and MIPs were obtained to evaluate the vascular anatomy. CONTRAST:  63mL OMNIPAQUE IOHEXOL 350 MG/ML SOLN COMPARISON:  Chest CT dated 07/05/2018 and radiograph dated 09/19/2019. FINDINGS: Cardiovascular: There is no cardiomegaly or pericardial effusion. The thoracic aorta is unremarkable. The origins of the great vessels of the aortic arch appear patent as visualized. There is no CT evidence of pulmonary embolism. Mediastinum/Nodes: There is no hilar or mediastinal adenopathy. The esophagus is grossly unremarkable. Lungs/Pleura:  There are bibasilar linear atelectasis/scarring. No focal consolidation, pleural effusion, pneumothorax. The central airways are patent. Tracheostomy above the carina. Upper Abdomen: No acute abnormality. Musculoskeletal: Degenerative changes of the spine. Anterior spinal osteophyte compatible with DISH. No acute osseous pathology. Review of the MIP images confirms the above findings. IMPRESSION: No acute intrathoracic pathology. No CT evidence of pulmonary embolism. Electronically Signed   By: Anner Crete M.D.   On: 12/04/2019 19:56   VAS [place] LOWER EXTREMITY VENOUS (DVT) (ONLY [facility] & [facility])  Result Date: 12/04/2019  Lower Venous DVT Study Indications: Edema.  Limitations: Body habitus and poor ultrasound/tissue interface. Comparison Study: 07/31/19 previous Performing Technologist: Abram Sander RVS  Examination Guidelines: A complete evaluation includes B-mode imaging, spectral Doppler, color Doppler, and power Doppler as needed of all accessible portions of each vessel. Bilateral testing is considered an integral part of a complete examination. Limited examinations for reoccurring indications may be performed as noted. The reflux portion of the exam is performed with the patient in reverse Trendelenburg.   +-----+---------------+---------+-----------+----------+--------------+ RIGHTCompressibilityPhasicitySpontaneityPropertiesThrombus Aging +-----+---------------+---------+-----------+----------+--------------+ CFV  Full           Yes      Yes                                 +-----+---------------+---------+-----------+----------+--------------+   +---------+---------------+---------+-----------+----------+-------------------+ LEFT     CompressibilityPhasicitySpontaneityPropertiesThrombus Aging      +---------+---------------+---------+-----------+----------+-------------------+ CFV      None           No       No                   Age Indeterminate   +---------+---------------+---------+-----------+----------+-------------------+ SFJ      None                                         Age Indeterminate   +---------+---------------+---------+-----------+----------+-------------------+ FV Prox  None                                         Age Indeterminate   +---------+---------------+---------+-----------+----------+-------------------+ FV Mid   None                                         Age Indeterminate   +---------+---------------+---------+-----------+----------+-------------------+ FV Distal               No       No                   Age Indeterminate   +---------+---------------+---------+-----------+----------+-------------------+ PFV      None                                         Age Indeterminate   +---------+---------------+---------+-----------+----------+-------------------+ POP      None           No       No                   Age Indeterminate   +---------+---------------+---------+-----------+----------+-------------------+ PTV  None                                         Age Indeterminate   +---------+---------------+---------+-----------+----------+-------------------+ PERO                                                   Not well visualized +---------+---------------+---------+-----------+----------+-------------------+ EIV                                                   Not visualized      +---------+---------------+---------+-----------+----------+-------------------+     Summary: RIGHT: - No evidence of common femoral vein obstruction.  LEFT: - Findings consistent with age indeterminate deep vein thrombosis involving the left common femoral vein, SF junction, left femoral vein, left proximal profunda vein, left popliteal vein, and left posterior tibial veins. - No cystic structure found in the popliteal fossa.  *See table(s) above for measurements and observations.    Preliminary     Assessment/Plan   1. Acute LLE DVT  - Presents with several days of progressive left leg pain, swelling, and redness and is found to have extensive DVT  - He has been sedentary since hospital discharge 2 months ago, no other risk factors identified   - LLE compartments remain soft and pulses intact  - Given his severe comorbidity and high-risk for complication, will start treatment in the hospital, continue IV heparin for now    2. Chronic hypoxic & hypercarbic respiratory failure; OSA/OHS; asthma  - Stable, continue BiPAP at night, inhalers    3. Chronic diastolic CHF  - SCr is up from baseline and he does not appear hypervolemic, will hold diuretic for now   4. Insulin-dependent DM  - A1c was 6.5% in September 2021  - Continue CBG checks and insulin    5. Depression, anxiety  - Continue Seroquel, Zoloft, trazodone    6. Hypertension  - BP at goal, continue metoprolol     DVT prophylaxis: IV heparin  Code Status: Full  Family Communication: Discussed with patient  Disposition Plan:  Patient is from: Home  Anticipated d/c is to: TBD Anticipated d/c date is: 11/16 or 12/06/19 Patient currently: Being started on anticoagulation  Consults called: None  Admission status: Observation      Vianne Bulls, MD [facility]  12/04/2019, 9:07 PM

## 2019-12-04 NOTE — ED Triage Notes (Signed)
Pt bib ems with L leg edema for the last few days. Denies hx of blood clots. Denies known injury.

## 2019-12-05 ENCOUNTER — Encounter (HOSPITAL_COMMUNITY): Payer: Self-pay | Admitting: Internal Medicine

## 2019-12-05 DIAGNOSIS — I82412 Acute embolism and thrombosis of left femoral vein: Secondary | ICD-10-CM | POA: Diagnosis present

## 2019-12-05 DIAGNOSIS — I82220 Acute embolism and thrombosis of inferior vena cava: Secondary | ICD-10-CM | POA: Diagnosis present

## 2019-12-05 DIAGNOSIS — F418 Other specified anxiety disorders: Secondary | ICD-10-CM | POA: Diagnosis present

## 2019-12-05 DIAGNOSIS — J9612 Chronic respiratory failure with hypercapnia: Secondary | ICD-10-CM

## 2019-12-05 DIAGNOSIS — Z20822 Contact with and (suspected) exposure to covid-19: Secondary | ICD-10-CM | POA: Diagnosis present

## 2019-12-05 DIAGNOSIS — I5032 Chronic diastolic (congestive) heart failure: Secondary | ICD-10-CM | POA: Diagnosis present

## 2019-12-05 DIAGNOSIS — I824Z2 Acute embolism and thrombosis of unspecified deep veins of left distal lower extremity: Secondary | ICD-10-CM

## 2019-12-05 DIAGNOSIS — I82492 Acute embolism and thrombosis of other specified deep vein of left lower extremity: Secondary | ICD-10-CM | POA: Diagnosis not present

## 2019-12-05 DIAGNOSIS — Z794 Long term (current) use of insulin: Secondary | ICD-10-CM | POA: Diagnosis not present

## 2019-12-05 DIAGNOSIS — Z7951 Long term (current) use of inhaled steroids: Secondary | ICD-10-CM | POA: Diagnosis not present

## 2019-12-05 DIAGNOSIS — E1165 Type 2 diabetes mellitus with hyperglycemia: Secondary | ICD-10-CM

## 2019-12-05 DIAGNOSIS — I82432 Acute embolism and thrombosis of left popliteal vein: Secondary | ICD-10-CM | POA: Diagnosis present

## 2019-12-05 DIAGNOSIS — E890 Postprocedural hypothyroidism: Secondary | ICD-10-CM | POA: Diagnosis present

## 2019-12-05 DIAGNOSIS — I871 Compression of vein: Secondary | ICD-10-CM | POA: Diagnosis not present

## 2019-12-05 DIAGNOSIS — I82442 Acute embolism and thrombosis of left tibial vein: Secondary | ICD-10-CM | POA: Diagnosis present

## 2019-12-05 DIAGNOSIS — I110 Hypertensive heart disease with heart failure: Secondary | ICD-10-CM | POA: Diagnosis present

## 2019-12-05 DIAGNOSIS — Z6841 Body Mass Index (BMI) 40.0 and over, adult: Secondary | ICD-10-CM | POA: Diagnosis not present

## 2019-12-05 DIAGNOSIS — Z87891 Personal history of nicotine dependence: Secondary | ICD-10-CM | POA: Diagnosis not present

## 2019-12-05 DIAGNOSIS — E662 Morbid (severe) obesity with alveolar hypoventilation: Secondary | ICD-10-CM | POA: Diagnosis present

## 2019-12-05 DIAGNOSIS — J449 Chronic obstructive pulmonary disease, unspecified: Secondary | ICD-10-CM | POA: Diagnosis present

## 2019-12-05 DIAGNOSIS — E119 Type 2 diabetes mellitus without complications: Secondary | ICD-10-CM | POA: Diagnosis present

## 2019-12-05 DIAGNOSIS — K219 Gastro-esophageal reflux disease without esophagitis: Secondary | ICD-10-CM | POA: Diagnosis present

## 2019-12-05 DIAGNOSIS — M109 Gout, unspecified: Secondary | ICD-10-CM | POA: Diagnosis present

## 2019-12-05 DIAGNOSIS — Z79899 Other long term (current) drug therapy: Secondary | ICD-10-CM | POA: Diagnosis not present

## 2019-12-05 DIAGNOSIS — I10 Essential (primary) hypertension: Secondary | ICD-10-CM | POA: Diagnosis not present

## 2019-12-05 DIAGNOSIS — Z930 Tracheostomy status: Secondary | ICD-10-CM | POA: Diagnosis not present

## 2019-12-05 DIAGNOSIS — I82409 Acute embolism and thrombosis of unspecified deep veins of unspecified lower extremity: Secondary | ICD-10-CM | POA: Diagnosis not present

## 2019-12-05 DIAGNOSIS — E785 Hyperlipidemia, unspecified: Secondary | ICD-10-CM | POA: Diagnosis present

## 2019-12-05 DIAGNOSIS — J9611 Chronic respiratory failure with hypoxia: Secondary | ICD-10-CM | POA: Diagnosis present

## 2019-12-05 LAB — RESPIRATORY PANEL BY RT PCR (FLU A&B, COVID)
Influenza A by PCR: NEGATIVE
Influenza B by PCR: NEGATIVE
SARS Coronavirus 2 by RT PCR: NEGATIVE

## 2019-12-05 LAB — BASIC METABOLIC PANEL
Anion gap: 14 (ref 5–15)
BUN: 17 mg/dL (ref 6–20)
CO2: 25 mmol/L (ref 22–32)
Calcium: 9.1 mg/dL (ref 8.9–10.3)
Chloride: 99 mmol/L (ref 98–111)
Creatinine, Ser: 1.07 mg/dL (ref 0.61–1.24)
GFR, Estimated: >60 mL/min (ref >60)
Glucose, Bld: 154 mg/dL — ABNORMAL HIGH (ref 70–99)
Potassium: 3.6 mmol/L (ref 3.5–5.1)
Sodium: 138 mmol/L (ref 135–145)

## 2019-12-05 LAB — CBC
HCT: 40.3 % (ref 39.0–52.0)
Hemoglobin: 12.4 g/dL — ABNORMAL LOW (ref 13.0–17.0)
MCH: 26.7 pg (ref 26.0–34.0)
MCHC: 30.8 g/dL (ref 30.0–36.0)
MCV: 86.7 fL (ref 80.0–100.0)
Platelets: 164 10*3/uL (ref 150–400)
RBC: 4.65 MIL/uL (ref 4.22–5.81)
RDW: 16.1 % — ABNORMAL HIGH (ref 11.5–15.5)
WBC: 8.7 10*3/uL (ref 4.0–10.5)
nRBC: 0 % (ref 0.0–0.2)

## 2019-12-05 LAB — CBG MONITORING, ED
Glucose-Capillary: 179 mg/dL — ABNORMAL HIGH (ref 70–99)
Glucose-Capillary: 157 mg/dL — ABNORMAL HIGH (ref 70–99)
Glucose-Capillary: 141 mg/dL — ABNORMAL HIGH (ref 70–99)

## 2019-12-05 LAB — GLUCOSE, CAPILLARY: Glucose-Capillary: 165 mg/dL — ABNORMAL HIGH (ref 70–99)

## 2019-12-05 LAB — HEPARIN LEVEL (UNFRACTIONATED)
Heparin Unfractionated: 0.29 IU/mL — ABNORMAL LOW (ref 0.30–0.70)
Heparin Unfractionated: >2.2 IU/mL — ABNORMAL HIGH (ref 0.30–0.70)
Heparin Unfractionated: 0.55 IU/mL (ref 0.30–0.70)

## 2019-12-05 NOTE — Progress Notes (Signed)
PROGRESS NOTE    Timothy Le  YIR:[id number] DOB: Sep 26, 1960 DOA: 12/04/2019 PCP: Vevelyn Francois, NP    No chief complaint on file.   Brief Narrative:  59 year old male with PMH of morbid obesity (BMI 58), OSA, COPD, chronic diastolic heart failure, and chronic respiratory failure with tracheostomy in place who presented to the ED on 11/15 with left leg swelling found to have an extensive LEFT lower extremity DVT involving the left common femoral vein, SF junction, as well as left proximal profunda, popliteal, and posterior tibial veins.  CTA negative for PE.  On Day 2 of Heparin drip.   Assessment & Plan:   Principal Problem:   Acute deep vein thrombosis (DVT) of left lower extremity (HCC) Active Problems:   OSA (obstructive sleep apnea)   COPD mixed type (HCC)   Diabetes mellitus type 2, controlled (HCC)   Hypoventilation associated with obesity syndrome (HCC)   Benign essential HTN   Chronic respiratory failure with hypoxia and hypercapnia (HCC)   Chronic diastolic CHF (congestive heart failure) ([place])   Depression with anxiety   DVT of Left Lower Extremity: Continue IV Heparin Drip.  Transition to [place] tomorrow if doing ok.  Give Oxycodone PRN for pain. Chronic Hypoxic Respiratory Failure due to OSA/OHS and COPD: Trach in place.  Wear BIPAP nightly.  Follow up with Pulmonary outpatient. Chronic Diastolic Heart Failure: Euvolemic.  Hold diuretics. Insulin Dependent Diabetes: Continue Levemir 17 units nightly and SSi with POC glucose q6hrs. Depression/Anxiety: Ok to continue home psych meds Seroquel, Zoloft, and Trazodone Essential Hypertension: BP ok.  Hold home meds.   DVT prophylaxis: Heparin Code Status: Full Family Communication: Discussed with patient. Disposition:   Status is: Inpatient  Remains inpatient appropriate because:Inpatient level of care appropriate due to severity of illness   Dispo: The patient is from: Home              Anticipated d/c is  to: Home              Anticipated d/c date is: 1 day              Consultants:   None  Procedures:   None  Antimicrobials:   None    Subjective: Patient's pain is under control.  Denies any chest pain or shortness of breath.  Left leg is swollen - soft, no skin changes, pulses intact.  Objective: Vitals:   12/05/19 0930 12/05/19 1030 12/05/19 1145 12/05/19 1246  BP: 112/68 (!) 121/58  (!) 102/55  Pulse: 88 86  78  Resp: (!) 24 19 (!) 24 (!) 22  Temp:    98.9 F (37.2 C)  TempSrc:    Oral  SpO2: 92% 99% 94% 96%  Weight:      Height:       No intake or output data in the 24 hours ending 12/05/19 1353 Filed Weights   12/04/19 2000  Weight: (!) 151.1 kg    Examination:  General exam: obese male lying in bed in NAD, trach in place. Respiratory system: Clear to auscultation. Respiratory effort normal. Cardiovascular system: Regular rate and rhythm.  Did not appreciate a murmur.  No JVD. No pedal edema. Gastrointestinal system: Abdomen is nondistended, soft and nontender. No massest. Normal bowel sounds heard. Central nervous system: Alert and oriented. No focal neurological deficits. Extremities: Symmetric 5 x 5 power. Skin: No rashes, lesions or ulcers Psychiatry: Judgement and insight appear normal. Mood & affect appropriate.    Data Reviewed:  I have personally reviewed following labs and imaging studies  CBC: Recent Labs  Lab 12/04/19 1722 12/05/19 0523  WBC 11.4* 8.7  HGB 12.8* 12.4*  HCT 42.2 40.3  MCV 86.8 86.7  PLT 174 518    Basic Metabolic Panel: Recent Labs  Lab 12/04/19 1722 12/05/19 0523  NA 138 138  K 3.8 3.6  CL 96* 99  CO2 27 25  GLUCOSE 279* 154*  BUN 15 17  CREATININE 1.34* 1.07  CALCIUM 9.1 9.1    GFR: Estimated Creatinine Clearance: 108 mL/min (by C-G formula based on SCr of 1.07 mg/dL).  Liver Function Tests: No results for input(s): AST, ALT, ALKPHOS, BILITOT, PROT, ALBUMIN in the last 168 hours.  CBG: Recent Labs    Lab 12/04/19 2244 12/05/19 0813 12/05/19 1245  GLUCAP 160* 179* 157*     Recent Results (from the past 240 hour(s))  Respiratory Panel by RT PCR (Flu A&B, Covid) - Nasopharyngeal Swab     Status: None   Collection Time: 12/04/19 10:55 PM   Specimen: Nasopharyngeal Swab  Result Value Ref Range Status   SARS Coronavirus 2 by RT PCR NEGATIVE NEGATIVE Final    Comment: (NOTE) SARS-CoV-2 target nucleic acids are NOT DETECTED.  The SARS-CoV-2 RNA is generally detectable in upper respiratoy specimens during the acute phase of infection. The lowest concentration of SARS-CoV-2 viral copies this assay can detect is 131 copies/mL. A negative result does not preclude SARS-Cov-2 infection and should not be used as the sole basis for treatment or other patient management decisions. A negative result may occur with  improper specimen collection/handling, submission of specimen other than nasopharyngeal swab, presence of viral mutation(s) within the areas targeted by this assay, and inadequate number of viral copies (<131 copies/mL). A negative result must be combined with clinical observations, patient history, and epidemiological information. The expected result is Negative.  Fact Sheet for Patients:  PinkCheek.be  Fact Sheet for Healthcare Providers:  GravelBags.it  This test is no t yet approved or cleared by the [place] FDA and  has been authorized for detection and/or diagnosis of SARS-CoV-2 by FDA under an Emergency Use Authorization (EUA). This EUA will remain  in effect (meaning this test can be used) for the duration of the COVID-19 declaration under Section 564(b)(1) of the Act, 21 U.S.C. section 360bbb-3(b)(1), unless the authorization is terminated or revoked sooner.     Influenza A by PCR NEGATIVE NEGATIVE Final   Influenza B by PCR NEGATIVE NEGATIVE Final    Comment: (NOTE) The Xpert Xpress  SARS-CoV-2/FLU/RSV assay is intended as an aid in  the diagnosis of influenza from Nasopharyngeal swab specimens and  should not be used as a sole basis for treatment. Nasal washings and  aspirates are unacceptable for Xpert Xpress SARS-CoV-2/FLU/RSV  testing.  Fact Sheet for Patients: PinkCheek.be  Fact Sheet for Healthcare Providers: GravelBags.it  This test is not yet approved or cleared by the [place] FDA and  has been authorized for detection and/or diagnosis of SARS-CoV-2 by  FDA under an Emergency Use Authorization (EUA). This EUA will remain  in effect (meaning this test can be used) for the duration of the  Covid-19 declaration under Section 564(b)(1) of the Act, 21  U.S.C. section 360bbb-3(b)(1), unless the authorization is  terminated or revoked. Performed at [facility], [place] [address], [place], [place] [address]      Radiology Studies: CT Angio Chest PE W/Cm &/Or Wo Cm  Result Date: 12/04/2019 CLINICAL DATA:  59 year old male with concern for pulmonary embolism. EXAM: CT ANGIOGRAPHY CHEST WITH CONTRAST TECHNIQUE: Multidetector CT imaging of the chest was performed using the standard protocol during bolus administration of intravenous contrast. Multiplanar CT image reconstructions and MIPs were obtained to evaluate the vascular anatomy. CONTRAST:  1mL OMNIPAQUE IOHEXOL 350 MG/ML SOLN COMPARISON:  Chest CT dated 07/05/2018 and radiograph dated 09/19/2019. FINDINGS: Cardiovascular: There is no cardiomegaly or pericardial effusion. The thoracic aorta is unremarkable. The origins of the great vessels of the aortic arch appear patent as visualized. There is no CT evidence of pulmonary embolism. Mediastinum/Nodes: There is no hilar or mediastinal adenopathy. The esophagus is grossly unremarkable. Lungs/Pleura: There are bibasilar linear atelectasis/scarring. No focal consolidation, pleural effusion, pneumothorax.  The central airways are patent. Tracheostomy above the carina. Upper Abdomen: No acute abnormality. Musculoskeletal: Degenerative changes of the spine. Anterior spinal osteophyte compatible with DISH. No acute osseous pathology. Review of the MIP images confirms the above findings. IMPRESSION: No acute intrathoracic pathology. No CT evidence of pulmonary embolism. Electronically Signed   By: Anner Crete M.D.   On: 12/04/2019 19:56   VAS [place] LOWER EXTREMITY VENOUS (DVT) (ONLY [facility] & [facility])  Result Date: 12/04/2019  Lower Venous DVT Study Indications: Edema.  Limitations: Body habitus and poor ultrasound/tissue interface. Comparison Study: 07/31/19 previous Performing Technologist: Abram Sander RVS  Examination Guidelines: A complete evaluation includes B-mode imaging, spectral Doppler, color Doppler, and power Doppler as needed of all accessible portions of each vessel. Bilateral testing is considered an integral part of a complete examination. Limited examinations for reoccurring indications may be performed as noted. The reflux portion of the exam is performed with the patient in reverse Trendelenburg.  +-----+---------------+---------+-----------+----------+--------------+ RIGHTCompressibilityPhasicitySpontaneityPropertiesThrombus Aging +-----+---------------+---------+-----------+----------+--------------+ CFV  Full           Yes      Yes                                 +-----+---------------+---------+-----------+----------+--------------+   +---------+---------------+---------+-----------+----------+-------------------+ LEFT     CompressibilityPhasicitySpontaneityPropertiesThrombus Aging      +---------+---------------+---------+-----------+----------+-------------------+ CFV      None           No       No                   Age Indeterminate   +---------+---------------+---------+-----------+----------+-------------------+ SFJ      None                                          Age Indeterminate   +---------+---------------+---------+-----------+----------+-------------------+ FV Prox  None                                         Age Indeterminate   +---------+---------------+---------+-----------+----------+-------------------+ FV Mid   None                                         Age Indeterminate   +---------+---------------+---------+-----------+----------+-------------------+ FV Distal               No       No  Age Indeterminate   +---------+---------------+---------+-----------+----------+-------------------+ PFV      None                                         Age Indeterminate   +---------+---------------+---------+-----------+----------+-------------------+ POP      None           No       No                   Age Indeterminate   +---------+---------------+---------+-----------+----------+-------------------+ PTV      None                                         Age Indeterminate   +---------+---------------+---------+-----------+----------+-------------------+ PERO                                                  Not well visualized +---------+---------------+---------+-----------+----------+-------------------+ EIV                                                   Not visualized      +---------+---------------+---------+-----------+----------+-------------------+     Summary: RIGHT: - No evidence of common femoral vein obstruction.  LEFT: - Findings consistent with age indeterminate deep vein thrombosis involving the left common femoral vein, SF junction, left femoral vein, left proximal profunda vein, left popliteal vein, and left posterior tibial veins. - No cystic structure found in the popliteal fossa.  *See table(s) above for measurements and observations.    Preliminary      Scheduled Meds: . allopurinol  100 mg Oral Daily  . atorvastatin  10 mg Oral Daily  . diclofenac Sodium  2 g Topical  QID  . gabapentin  200 mg Oral TID  . insulin aspart  0-5 Units Subcutaneous QHS  . insulin aspart  0-9 Units Subcutaneous TID WC  . insulin detemir  17 Units Subcutaneous BID  . metoprolol tartrate  12.5 mg Oral BID  . mometasone-formoterol  2 puff Inhalation BID  . pantoprazole  40 mg Oral QHS  . QUEtiapine  100 mg Oral BID  . sertraline  100 mg Oral Daily   Continuous Infusions: . sodium chloride 10 mL/hr at 12/04/19 2254  . heparin 1,900 Units/hr (12/05/19 0612)     LOS: 0 days    Time spent: 30 minutes   George Hugh, MD [facility]   To contact the attending provider between 7A-7P or the covering provider during after hours 7P-7A, please log into the web site [email or web address] and access using universal [place] password for that web site. If you do not have the password, please call the hospital operator.  12/05/2019, 1:53 PM

## 2019-12-05 NOTE — ED Notes (Signed)
Pt resting in bed. NADN 

## 2019-12-05 NOTE — ED Notes (Signed)
First contact. Change of shift. Pt resting in bed. NADN. RT at bedside for tracheostomy care and suction.

## 2019-12-05 NOTE — Progress Notes (Addendum)
ANTICOAGULATION CONSULT NOTE  Pharmacy Consult for Heparin Indication: DVT  No Known Allergies  Patient Measurements: Height: 5\' 8"  (172.7 cm) Weight: (!) 151.1 kg (333 lb 3.2 oz) IBW/kg (Calculated) : 68.4 Heparin Dosing Weight: 105.2 kg  Vital Signs: Temp: 99.1 F (37.3 C) (11/16 2025) Temp Source: Oral (11/16 2025) BP: 128/83 (11/16 2025) Pulse Rate: 83 (11/16 2054)  Labs: Recent Labs    12/04/19 1722 12/05/19 0523 12/05/19 1400 12/05/19 1555  HGB 12.8* 12.4*  --   --   HCT 42.2 40.3  --   --   PLT 174 164  --   --   HEPARINUNFRC  --  0.29* >2.20* 0.55  CREATININE 1.34* 1.07  --   --     Estimated Creatinine Clearance: 108 mL/min (by C-G formula based on SCr of 1.07 mg/dL).   Medical History: Past Medical History:  Diagnosis Date  . Asthma   . Chronic respiratory failure with hypoxia ([place]) 07/09/2017  . COPD (chronic obstructive pulmonary disease) ([place])   . Diabetes mellitus without complication ([place])   . Difficult intubation   . Hypertension   . Shortness of breath dyspnea   . Sleep apnea    Assessment: 59 y.o. male found to have DVT. Pharmacy consulted to dose heparin.  Heparin level this afternoon was falsely supratherapeutic after being drawn incorrectly. [place]wn level is therapeutic at 0.55. CBC stable. No bleeding noted.   Goal of Therapy:  Heparin level 0.3-0.7 units/ml Monitor platelets by anticoagulation protocol: Yes   Plan:  Continue heparin at 1900 units/hr Daily heparin level, CBC Monitor s/sx bleeding  Rebbeca Paul, PharmD PGY1 Pharmacy Resident 12/05/2019 8:57 PM  Please check [email or web address] for unit-specific pharmacy phone numbers.

## 2019-12-05 NOTE — ED Notes (Signed)
Pt resting in bed. Provided sterile suction a tracheostomy.

## 2019-12-05 NOTE — ED Notes (Signed)
Pt resting in bed. Sleeping

## 2019-12-05 NOTE — Progress Notes (Signed)
Heparin IV started.  Second Psychiatrist

## 2019-12-05 NOTE — ED Notes (Signed)
sleeping

## 2019-12-05 NOTE — Progress Notes (Signed)
ANTICOAGULATION CONSULT NOTE   Pharmacy Consult for Heparin Indication: DVT  Assessment: 59 y.o. male found to have DVT. Pharmacy consulted to dose heparin.  Heparin level this am 0.29 units/ml.  Hg 12.4, PTLC 164  Goal of Therapy:  Heparin level 0.3-0.7 units/ml Monitor platelets by anticoagulation protocol: Yes   Plan:  Increase heparin infusion to 1900 units/hr 8 h heparin level Daily heparin level, CBC Monitor s/sx bleeding  Thanks for allowing pharmacy to be a part of this patient's care.  Excell Seltzer, PharmD Clinical Pharmacist

## 2019-12-05 NOTE — ED Notes (Signed)
Report to [employer]

## 2019-12-06 DIAGNOSIS — I82492 Acute embolism and thrombosis of other specified deep vein of left lower extremity: Secondary | ICD-10-CM

## 2019-12-06 DIAGNOSIS — I824Z2 Acute embolism and thrombosis of unspecified deep veins of left distal lower extremity: Secondary | ICD-10-CM | POA: Diagnosis not present

## 2019-12-06 DIAGNOSIS — I871 Compression of vein: Secondary | ICD-10-CM

## 2019-12-06 LAB — CBC
HCT: 38.3 % — ABNORMAL LOW (ref 39.0–52.0)
Hemoglobin: 12 g/dL — ABNORMAL LOW (ref 13.0–17.0)
MCH: 26.7 pg (ref 26.0–34.0)
MCHC: 31.3 g/dL (ref 30.0–36.0)
MCV: 85.1 fL (ref 80.0–100.0)
Platelets: 169 10*3/uL (ref 150–400)
RBC: 4.5 MIL/uL (ref 4.22–5.81)
RDW: 16 % — ABNORMAL HIGH (ref 11.5–15.5)
WBC: 7.3 10*3/uL (ref 4.0–10.5)
nRBC: 0 % (ref 0.0–0.2)

## 2019-12-06 LAB — HEPARIN LEVEL (UNFRACTIONATED): Heparin Unfractionated: 0.56 IU/mL (ref 0.30–0.70)

## 2019-12-06 LAB — BASIC METABOLIC PANEL
Anion gap: 11 (ref 5–15)
BUN: 17 mg/dL (ref 6–20)
CO2: 27 mmol/L (ref 22–32)
Calcium: 8.7 mg/dL — ABNORMAL LOW (ref 8.9–10.3)
Chloride: 98 mmol/L (ref 98–111)
Creatinine, Ser: 1.13 mg/dL (ref 0.61–1.24)
GFR, Estimated: >60 mL/min (ref >60)
Glucose, Bld: 164 mg/dL — ABNORMAL HIGH (ref 70–99)
Potassium: 3.8 mmol/L (ref 3.5–5.1)
Sodium: 136 mmol/L (ref 135–145)

## 2019-12-06 LAB — GLUCOSE, CAPILLARY
Glucose-Capillary: 156 mg/dL — ABNORMAL HIGH (ref 70–99)
Glucose-Capillary: 213 mg/dL — ABNORMAL HIGH (ref 70–99)
Glucose-Capillary: 128 mg/dL — ABNORMAL HIGH (ref 70–99)
Glucose-Capillary: 174 mg/dL — ABNORMAL HIGH (ref 70–99)

## 2019-12-06 NOTE — H&P (View-Only) (Signed)
Hospital Consult    Reason for Consult: LLE DVT Requesting Physician:  Dr. Posey Pronto  MRN #:  [id number]  History of Present Illness: This is a 59 y.o. male with past medical history significant for morbid obesity, obstructive sleep apnea, CHF, COPD and chronic respiratory failure with trach who presented to the ED on 11/15 with 3 day history of increased left lower extremity swelling, redness and pain. He reports that he does ambulate but has been relatively sedentary since he has been home since prior hospitalization and rehab several months ago. The most ambulating he does is just around his home and to and from mailbox and to take the trash out. On admission he had lower extremity venous Duplex showing extensive LLE DVT and he was initiated on heparin. He reports that his pain is better and the swelling has subsided some. States he had not been able to sleep due to pain. He does still some numbness in the foot, which is new since this occurred. He says presently leg still feels very tight and achy. He says he feels like he has to constantly keep repositioning to find comfort. He denies any pain in his foot.  He denies any history of DVT or family history of DVT  Past Medical History:  Diagnosis Date  . Asthma   . Chronic respiratory failure with hypoxia ([place]) 07/09/2017  . COPD (chronic obstructive pulmonary disease) ([place])   . Diabetes mellitus without complication ([place])   . Difficult intubation   . Hypertension   . Shortness of breath dyspnea   . Sleep apnea     Past Surgical History:  Procedure Laterality Date  . IR GASTROSTOMY TUBE MOD SED  07/05/2017  . THYROIDECTOMY Left 08/30/2019   Procedure: THYROIDECTOMY CERVICAL LIPECTOMY;  Surgeon: Izora Gala, MD;  Location: [facility];  Service: ENT;  Laterality: Left;  NEEDS RNFA PLEASE  . TRACHEOSTOMY TUBE PLACEMENT N/A 06/17/2017   Procedure: TRACHEOSTOMY;  Surgeon: Leta Baptist, MD;  Location: [facility];  Service: ENT;  Laterality: N/A;  . TRACHEOSTOMY  TUBE PLACEMENT N/A 08/14/2019   Procedure: TRACHEOSTOMY;  Surgeon: Izora Gala, MD;  Location: [facility];  Service: ENT;  Laterality: N/A;  . TRACHEOSTOMY TUBE PLACEMENT N/A 08/30/2019   Procedure: TRACHEOSTOMY  REVISION;  Surgeon: Izora Gala, MD;  Location: [facility];  Service: ENT;  Laterality: N/A;    No Known Allergies  Prior to Admission medications   Medication Sig Start Date End Date Taking? Authorizing Provider  acetaminophen (TYLENOL) 325 MG tablet Take 1-2 tablets (325-650 mg total) by mouth every 4 (four) hours as needed for mild pain. 09/26/19  Yes Love, Ivan Anchors, PA-C  allopurinol (ZYLOPRIM) 100 MG tablet Take 1 tablet (100 mg total) by mouth daily. 10/12/19 10/11/20 Yes Vevelyn Francois, NP  atorvastatin (LIPITOR) 10 MG tablet Take 1 tablet (10 mg total) by mouth daily. 10/12/19 10/11/20 Yes Vevelyn Francois, NP  diclofenac Sodium (VOLTAREN) 1 % GEL Apply 2 g topically 4 (four) times daily. 10/12/19  Yes Vevelyn Francois, NP  gabapentin (NEURONTIN) 100 MG capsule Take 2 capsules (200 mg total) by mouth 3 (three) times daily. 10/12/19 10/11/20 Yes King, Diona Foley, NP  insulin detemir (LEVEMIR) 100 UNIT/ML injection Inject 0.33 mLs (33 Units total) into the skin every 12 (twelve) hours. 10/12/19  Yes King, Diona Foley, NP  ipratropium-albuterol (DUONEB) 0.5-2.5 (3) MG/3ML SOLN Take 3 mLs by nebulization every 6 (six) hours as needed. 10/12/19 10/11/20 Yes Vevelyn Francois, NP  metoprolol tartrate (  LOPRESSOR) 25 MG tablet Take 0.5 tablets (12.5 mg total) by mouth 2 (two) times daily. 10/12/19 10/11/20 Yes King, Diona Foley, NP  mometasone-formoterol (DULERA) 100-5 MCG/ACT AERO Inhale 2 puffs into the lungs 2 (two) times daily. 10/12/19  Yes Vevelyn Francois, NP  QUEtiapine (SEROQUEL) 100 MG tablet Take 1 tablet (100 mg total) by mouth 2 (two) times daily. 10/12/19 10/11/20 Yes Vevelyn Francois, NP  sertraline (ZOLOFT) 100 MG tablet Take 1 tablet (100 mg total) by mouth daily. 10/12/19 10/11/20 Yes Vevelyn Francois, NP    torsemide (DEMADEX) 10 MG tablet Take 1 tablet (10 mg total) by mouth daily. 10/12/19 10/11/20 Yes Vevelyn Francois, NP  alum & mag hydroxide-simeth (MAALOX/MYLANTA) 200-200-20 MG/5ML suspension Take 30 mLs by mouth every 4 (four) hours as needed for indigestion, heartburn or flatulence. Patient not taking: Reported on 12/04/2019 09/15/19   Samella Parr, NP  dextromethorphan-guaiFENesin [facility] DM) 30-600 MG 12hr tablet Take 1 tablet by mouth 2 (two) times daily. Patient not taking: Reported on 12/04/2019 09/15/19   Samella Parr, NP  Multiple Vitamin (MULTIVITAMIN WITH MINERALS) TABS tablet Take 1 tablet by mouth daily. Patient not taking: Reported on 12/04/2019 09/16/19   Samella Parr, NP  nutrition supplement, JUVEN, (JUVEN) PACK Take 1 packet by mouth 2 (two) times daily between meals. Patient not taking: Reported on 12/04/2019 09/26/19   Love, Ivan Anchors, PA-C  Oxycodone HCl 10 MG TABS Take 0.5 tablets (5 mg total) by mouth daily as needed (severe pain). Patient not taking: Reported on 12/04/2019 09/26/19   Love, Ivan Anchors, PA-C  pantoprazole (PROTONIX) 40 MG tablet Take 1 tablet (40 mg total) by mouth at bedtime. Patient not taking: Reported on 12/04/2019 10/12/19 10/11/20  Vevelyn Francois, NP  potassium chloride SA (KLOR-CON) 20 MEQ tablet Take 1 tablet (20 mEq total) by mouth 2 (two) times daily. Patient not taking: Reported on 12/04/2019 10/12/19 10/11/20  Vevelyn Francois, NP  senna-docusate (SENOKOT-S) 8.6-50 MG tablet Take 2 tablets by mouth 2 (two) times daily. Patient not taking: Reported on 12/04/2019 10/12/19   Vevelyn Francois, NP  traZODone (DESYREL) 50 MG tablet Take 0.5-1 tablets (25-50 mg total) by mouth at bedtime as needed for sleep. Patient not taking: Reported on 12/04/2019 10/12/19 10/11/20  Vevelyn Francois, NP    Social History   Socioeconomic History  . Marital status: Legally Separated    Spouse name: Not on file  . Number of children: 1  . Years of education: Not on  file  . Highest education level: Not on file  Occupational History  . Occupation: unemployed    Comment: working on [employer]  . Smoking status: Former Smoker    Packs/day: 0.50    Years: 0.50    Pack years: 0.25    Types: Cigarettes    Start date: 07/28/1983    Quit date: 02/27/2013    Years since quitting: 6.7  . Smokeless tobacco: Never Used  Vaping Use  . Vaping Use: Never used  Substance and Sexual Activity  . Alcohol use: No    Comment: quit ETOH about 5 months ago-beer and liquor  . Drug use: No    Comment: quit 6 months-"pot"-smoked couple joints a week  . Sexual activity: Not Currently  Other Topics Concern  . Not on file  Social History Narrative  . Not on file   Social Determinants of Health   Financial Resource Strain:   . Difficulty of Paying  Living Expenses: Not on file  Food Insecurity:   . Worried About Charity fundraiser in the Last Year: Not on file  . Ran Out of Food in the Last Year: Not on file  Transportation Needs:   . Lack of Transportation (Medical): Not on file  . Lack of Transportation (Non-Medical): Not on file  Physical Activity:   . Days of Exercise per Week: Not on file  . Minutes of Exercise per Session: Not on file  Stress:   . Feeling of Stress : Not on file  Social Connections:   . Frequency of Communication with Friends and Family: Not on file  . Frequency of Social Gatherings with Friends and Family: Not on file  . Attends Religious Services: Not on file  . Active Member of Clubs or Organizations: Not on file  . Attends Archivist Meetings: Not on file  . Marital Status: Not on file  Intimate Partner Violence:   . Fear of Current or Ex-Partner: Not on file  . Emotionally Abused: Not on file  . Physically Abused: Not on file  . Sexually Abused: Not on file     Family History  Problem Relation Age of Onset  . Asthma Sister   . Asthma Other        nephew  . Hypertension Sister   . Diabetes type II  Sister     ROS: Otherwise negative unless mentioned in HPI  Physical Examination  Vitals:   12/06/19 0949 12/06/19 1234  BP: 114/77   Pulse: 74 72  Resp:  18  Temp: 98.7 F (37.1 C)   SpO2: 100% 100%   Body mass index is 50.66 kg/m.  General:  Obese male, appears in discomfort but no acute distress Gait: Not observed HENT: WNL, normocephalic Pulmonary: with trach, non labored Cardiac: regular, without  Murmurs Abdomen: obese, soft, NT/ND Vascular Exam/Pulses: 2+ femoral, DP and PT pulses bilaterally. Lower extremities warm and well perfused. Left lower extremity very swollen. No signs of phlegmasia. Motor and sensation intact   Extremities: without ischemic changes, with Gangrene , without cellulitis; without open wounds;  Musculoskeletal: no muscle wasting or atrophy  Neurologic: A&O X 3;  No focal weakness or paresthesias are detected; speech is fluent/normal Psychiatric:  The pt has Normal affect.  CBC    Component Value Date/Time   WBC 7.3 12/06/2019 0239   RBC 4.50 12/06/2019 0239   HGB 12.0 (L) 12/06/2019 0239   HCT 38.3 (L) 12/06/2019 0239   PLT 169 12/06/2019 0239   MCV 85.1 12/06/2019 0239   MCH 26.7 12/06/2019 0239   MCHC 31.3 12/06/2019 0239   RDW 16.0 (H) 12/06/2019 0239   LYMPHSABS 1.9 09/22/2019 1915   MONOABS 0.8 09/22/2019 1915   EOSABS 0.2 09/22/2019 1915   BASOSABS 0.1 09/22/2019 1915    BMET    Component Value Date/Time   NA 136 12/06/2019 0239   NA 144 10/12/2019 0904   K 3.8 12/06/2019 0239   CL 98 12/06/2019 0239   CO2 27 12/06/2019 0239   GLUCOSE 164 (H) 12/06/2019 0239   BUN 17 12/06/2019 0239   BUN 23 10/12/2019 0904   CREATININE 1.13 12/06/2019 0239   CALCIUM 8.7 (L) 12/06/2019 0239   GFRNONAA >60 12/06/2019 0239   GFRAA 85 10/12/2019 0904    COAGS: Lab Results  Component Value Date   INR 1.2 08/30/2019   INR 1.24 07/05/2017   INR 1.15 06/30/2017     Non-Invasive Vascular Imaging:     +---------+---------------+---------+-----------+----------+---------------  ----+  LEFT   CompressibilityPhasicitySpontaneityPropertiesThrombus Aging     +---------+---------------+---------+-----------+----------+---------------  ----+  CFV   None      No    No          Age  Indeterminate   +---------+---------------+---------+-----------+----------+---------------  ----+  SFJ   None                      Age  Indeterminate   +---------+---------------+---------+-----------+----------+---------------  ----+  FV Prox None                      Age  Indeterminate   +---------+---------------+---------+-----------+----------+---------------  ----+  FV Mid  None                      Age  Indeterminate   +---------+---------------+---------+-----------+----------+---------------  ----+  FV Distal        No    No          Age  Indeterminate   +---------+---------------+---------+-----------+----------+---------------  ----+  PFV   None                      Age  Indeterminate   +---------+---------------+---------+-----------+----------+---------------  ----+  POP   None      No    No          Age  Indeterminate   +---------+---------------+---------+-----------+----------+---------------  ----+  PTV   None                      Age  Indeterminate   +---------+---------------+---------+-----------+----------+---------------  ----+  PERO                           Not well  visualized  +---------+---------------+---------+-----------+----------+---------------  ----+  EIV                           Not visualized      +---------+---------------+---------+-----------+----------+---------------  ----+   Summary:  RIGHT:  - No evidence of common femoral vein obstruction.    LEFT:  - Findings consistent with age indeterminate deep vein thrombosis involving the left common femoral vein, SF junction, left femoral vein, left proximal profunda vein, left popliteal vein, and left posterior tibial veins.  - No cystic structure found in the popliteal fossa.   Statin:  Yes.   Beta Blocker:  Yes.   Aspirin:  No. ACEI:  No. ARB:  No. CCB use:  No Other antiplatelets/anticoagulants:  Yes.   Heparin IV   ASSESSMENT/PLAN: This is a 59 y.o. male with extensive left lower extremity DVT involving the left common femoral vein, SFJ, femoral vein, proximal profunda femoral vein, left popliteal and posterior tibial veins. The other tibial veins were not well visualized and may be involved as well. He has no signs of critical limb ischemia or phlegmasia. His leg is well perfused and warm with palpable pulses. He is currently on IV heparin which seems to be improving his symptoms. Discussed medical management vs possibly needing mechanical thrombectomy. Recommend continuing him on IV heparin over next day or two to see if his symptoms continue to improve.  I have encouraged him to try and elevate his legs while he is in bed. The on call vascular surgeon Dr Trula Slade will see and evaluate patient later this afternoon to provide further management plans    Corrina Baglia PA-C [facility]  [phone] 12/06/2019  12:51 PM    I agree with the above.  Have seen and evaluated the patient.  Briefly this is a 59 year old male with morbid obesity who presented with a left leg DVT recently.  He was started on heparin.  We are asked for further recommendations.  The patient does have a trach for chronic COPD and was recently hospitalized.  His DVT ultrasound shows thrombus up to the common femoral vein but could not  evaluate the iliac veins.  Patient does not have vascular compromise of the leg.  There is no evidence of phlegmasia.  He is already on heparin.  He states that with the addition of heparin his leg tightness and pain has improved but is still present.  I have ordered a ileal caval ultrasound to evaluate for more proximal thrombus.  More than likely, the patient would be a candidate for mechanical thrombectomy.  He states that he would be able to lay prone on a table for the procedure.  This will likely be performed on Monday.  Please continue to keep him on heparin and his leg elevated.  I will also order a compression stocking.  Annamarie Major

## 2019-12-06 NOTE — Consult Note (Addendum)
Hospital Consult    Reason for Consult: LLE DVT Requesting Physician:  Dr. Posey Pronto  MRN #:  [id number]  History of Present Illness: This is a 59 y.o. male with past medical history significant for morbid obesity, obstructive sleep apnea, CHF, COPD and chronic respiratory failure with trach who presented to the ED on 11/15 with 3 day history of increased left lower extremity swelling, redness and pain. He reports that he does ambulate but has been relatively sedentary since he has been home since prior hospitalization and rehab several months ago. The most ambulating he does is just around his home and to and from mailbox and to take the trash out. On admission he had lower extremity venous Duplex showing extensive LLE DVT and he was initiated on heparin. He reports that his pain is better and the swelling has subsided some. States he had not been able to sleep due to pain. He does still some numbness in the foot, which is new since this occurred. He says presently leg still feels very tight and achy. He says he feels like he has to constantly keep repositioning to find comfort. He denies any pain in his foot.  He denies any history of DVT or family history of DVT  Past Medical History:  Diagnosis Date  . Asthma   . Chronic respiratory failure with hypoxia ([place]) 07/09/2017  . COPD (chronic obstructive pulmonary disease) ([place])   . Diabetes mellitus without complication ([place])   . Difficult intubation   . Hypertension   . Shortness of breath dyspnea   . Sleep apnea     Past Surgical History:  Procedure Laterality Date  . IR GASTROSTOMY TUBE MOD SED  07/05/2017  . THYROIDECTOMY Left 08/30/2019   Procedure: THYROIDECTOMY CERVICAL LIPECTOMY;  Surgeon: Izora Gala, MD;  Location: [place];  Service: ENT;  Laterality: Left;  NEEDS RNFA PLEASE  . TRACHEOSTOMY TUBE PLACEMENT N/A 06/17/2017   Procedure: TRACHEOSTOMY;  Surgeon: Leta Baptist, MD;  Location: [place];  Service: ENT;  Laterality: N/A;  . TRACHEOSTOMY  TUBE PLACEMENT N/A 08/14/2019   Procedure: TRACHEOSTOMY;  Surgeon: Izora Gala, MD;  Location: [place];  Service: ENT;  Laterality: N/A;  . TRACHEOSTOMY TUBE PLACEMENT N/A 08/30/2019   Procedure: TRACHEOSTOMY  REVISION;  Surgeon: Izora Gala, MD;  Location: [place];  Service: ENT;  Laterality: N/A;    No Known Allergies  Prior to Admission medications   Medication Sig Start Date End Date Taking? Authorizing Provider  acetaminophen (TYLENOL) 325 MG tablet Take 1-2 tablets (325-650 mg total) by mouth every 4 (four) hours as needed for mild pain. 09/26/19  Yes Love, Ivan Anchors, PA-C  allopurinol (ZYLOPRIM) 100 MG tablet Take 1 tablet (100 mg total) by mouth daily. 10/12/19 10/11/20 Yes Vevelyn Francois, NP  atorvastatin (LIPITOR) 10 MG tablet Take 1 tablet (10 mg total) by mouth daily. 10/12/19 10/11/20 Yes Vevelyn Francois, NP  diclofenac Sodium (VOLTAREN) 1 % GEL Apply 2 g topically 4 (four) times daily. 10/12/19  Yes Vevelyn Francois, NP  gabapentin (NEURONTIN) 100 MG capsule Take 2 capsules (200 mg total) by mouth 3 (three) times daily. 10/12/19 10/11/20 Yes King, Diona Foley, NP  insulin detemir (LEVEMIR) 100 UNIT/ML injection Inject 0.33 mLs (33 Units total) into the skin every 12 (twelve) hours. 10/12/19  Yes King, Diona Foley, NP  ipratropium-albuterol (DUONEB) 0.5-2.5 (3) MG/3ML SOLN Take 3 mLs by nebulization every 6 (six) hours as needed. 10/12/19 10/11/20 Yes Vevelyn Francois, NP  metoprolol tartrate (  LOPRESSOR) 25 MG tablet Take 0.5 tablets (12.5 mg total) by mouth 2 (two) times daily. 10/12/19 10/11/20 Yes King, Diona Foley, NP  mometasone-formoterol (DULERA) 100-5 MCG/ACT AERO Inhale 2 puffs into the lungs 2 (two) times daily. 10/12/19  Yes Vevelyn Francois, NP  QUEtiapine (SEROQUEL) 100 MG tablet Take 1 tablet (100 mg total) by mouth 2 (two) times daily. 10/12/19 10/11/20 Yes Vevelyn Francois, NP  sertraline (ZOLOFT) 100 MG tablet Take 1 tablet (100 mg total) by mouth daily. 10/12/19 10/11/20 Yes Vevelyn Francois, NP    torsemide (DEMADEX) 10 MG tablet Take 1 tablet (10 mg total) by mouth daily. 10/12/19 10/11/20 Yes Vevelyn Francois, NP  alum & mag hydroxide-simeth (MAALOX/MYLANTA) 200-200-20 MG/5ML suspension Take 30 mLs by mouth every 4 (four) hours as needed for indigestion, heartburn or flatulence. Patient not taking: Reported on 12/04/2019 09/15/19   Samella Parr, NP  dextromethorphan-guaiFENesin [facility] DM) 30-600 MG 12hr tablet Take 1 tablet by mouth 2 (two) times daily. Patient not taking: Reported on 12/04/2019 09/15/19   Samella Parr, NP  Multiple Vitamin (MULTIVITAMIN WITH MINERALS) TABS tablet Take 1 tablet by mouth daily. Patient not taking: Reported on 12/04/2019 09/16/19   Samella Parr, NP  nutrition supplement, JUVEN, (JUVEN) PACK Take 1 packet by mouth 2 (two) times daily between meals. Patient not taking: Reported on 12/04/2019 09/26/19   Love, Ivan Anchors, PA-C  Oxycodone HCl 10 MG TABS Take 0.5 tablets (5 mg total) by mouth daily as needed (severe pain). Patient not taking: Reported on 12/04/2019 09/26/19   Love, Ivan Anchors, PA-C  pantoprazole (PROTONIX) 40 MG tablet Take 1 tablet (40 mg total) by mouth at bedtime. Patient not taking: Reported on 12/04/2019 10/12/19 10/11/20  Vevelyn Francois, NP  potassium chloride SA (KLOR-CON) 20 MEQ tablet Take 1 tablet (20 mEq total) by mouth 2 (two) times daily. Patient not taking: Reported on 12/04/2019 10/12/19 10/11/20  Vevelyn Francois, NP  senna-docusate (SENOKOT-S) 8.6-50 MG tablet Take 2 tablets by mouth 2 (two) times daily. Patient not taking: Reported on 12/04/2019 10/12/19   Vevelyn Francois, NP  traZODone (DESYREL) 50 MG tablet Take 0.5-1 tablets (25-50 mg total) by mouth at bedtime as needed for sleep. Patient not taking: Reported on 12/04/2019 10/12/19 10/11/20  Vevelyn Francois, NP    Social History   Socioeconomic History  . Marital status: Legally Separated    Spouse name: Not on file  . Number of children: 1  . Years of education: Not on  file  . Highest education level: Not on file  Occupational History  . Occupation: unemployed    Comment: working on [employer]  . Smoking status: Former Smoker    Packs/day: 0.50    Years: 0.50    Pack years: 0.25    Types: Cigarettes    Start date: 07/28/1983    Quit date: 02/27/2013    Years since quitting: 6.7  . Smokeless tobacco: Never Used  Vaping Use  . Vaping Use: Never used  Substance and Sexual Activity  . Alcohol use: No    Comment: quit ETOH about 5 months ago-beer and liquor  . Drug use: No    Comment: quit 6 months-"pot"-smoked couple joints a week  . Sexual activity: Not Currently  Other Topics Concern  . Not on file  Social History Narrative  . Not on file   Social Determinants of Health   Financial Resource Strain:   . Difficulty of Paying  Living Expenses: Not on file  Food Insecurity:   . Worried About Charity fundraiser in the Last Year: Not on file  . Ran Out of Food in the Last Year: Not on file  Transportation Needs:   . Lack of Transportation (Medical): Not on file  . Lack of Transportation (Non-Medical): Not on file  Physical Activity:   . Days of Exercise per Week: Not on file  . Minutes of Exercise per Session: Not on file  Stress:   . Feeling of Stress : Not on file  Social Connections:   . Frequency of Communication with Friends and Family: Not on file  . Frequency of Social Gatherings with Friends and Family: Not on file  . Attends Religious Services: Not on file  . Active Member of Clubs or Organizations: Not on file  . Attends Archivist Meetings: Not on file  . Marital Status: Not on file  Intimate Partner Violence:   . Fear of Current or Ex-Partner: Not on file  . Emotionally Abused: Not on file  . Physically Abused: Not on file  . Sexually Abused: Not on file     Family History  Problem Relation Age of Onset  . Asthma Sister   . Asthma Other        nephew  . Hypertension Sister   . Diabetes type II  Sister     ROS: Otherwise negative unless mentioned in HPI  Physical Examination  Vitals:   12/06/19 0949 12/06/19 1234  BP: 114/77   Pulse: 74 72  Resp:  18  Temp: 98.7 F (37.1 C)   SpO2: 100% 100%   Body mass index is 50.66 kg/m.  General:  Obese male, appears in discomfort but no acute distress Gait: Not observed HENT: WNL, normocephalic Pulmonary: with trach, non labored Cardiac: regular, without  Murmurs Abdomen: obese, soft, NT/ND Vascular Exam/Pulses: 2+ femoral, DP and PT pulses bilaterally. Lower extremities warm and well perfused. Left lower extremity very swollen. No signs of phlegmasia. Motor and sensation intact   Extremities: without ischemic changes, with Gangrene , without cellulitis; without open wounds;  Musculoskeletal: no muscle wasting or atrophy  Neurologic: A&O X 3;  No focal weakness or paresthesias are detected; speech is fluent/normal Psychiatric:  The pt has Normal affect.  CBC    Component Value Date/Time   WBC 7.3 12/06/2019 0239   RBC 4.50 12/06/2019 0239   HGB 12.0 (L) 12/06/2019 0239   HCT 38.3 (L) 12/06/2019 0239   PLT 169 12/06/2019 0239   MCV 85.1 12/06/2019 0239   MCH 26.7 12/06/2019 0239   MCHC 31.3 12/06/2019 0239   RDW 16.0 (H) 12/06/2019 0239   LYMPHSABS 1.9 09/22/2019 1915   MONOABS 0.8 09/22/2019 1915   EOSABS 0.2 09/22/2019 1915   BASOSABS 0.1 09/22/2019 1915    BMET    Component Value Date/Time   NA 136 12/06/2019 0239   NA 144 10/12/2019 0904   K 3.8 12/06/2019 0239   CL 98 12/06/2019 0239   CO2 27 12/06/2019 0239   GLUCOSE 164 (H) 12/06/2019 0239   BUN 17 12/06/2019 0239   BUN 23 10/12/2019 0904   CREATININE 1.13 12/06/2019 0239   CALCIUM 8.7 (L) 12/06/2019 0239   GFRNONAA >60 12/06/2019 0239   GFRAA 85 10/12/2019 0904    COAGS: Lab Results  Component Value Date   INR 1.2 08/30/2019   INR 1.24 07/05/2017   INR 1.15 06/30/2017     Non-Invasive Vascular Imaging:     +---------+---------------+---------+-----------+----------+---------------  ----+  LEFT   CompressibilityPhasicitySpontaneityPropertiesThrombus Aging     +---------+---------------+---------+-----------+----------+---------------  ----+  CFV   None      No    No          Age  Indeterminate   +---------+---------------+---------+-----------+----------+---------------  ----+  SFJ   None                      Age  Indeterminate   +---------+---------------+---------+-----------+----------+---------------  ----+  FV Prox None                      Age  Indeterminate   +---------+---------------+---------+-----------+----------+---------------  ----+  FV Mid  None                      Age  Indeterminate   +---------+---------------+---------+-----------+----------+---------------  ----+  FV Distal        No    No          Age  Indeterminate   +---------+---------------+---------+-----------+----------+---------------  ----+  PFV   None                      Age  Indeterminate   +---------+---------------+---------+-----------+----------+---------------  ----+  POP   None      No    No          Age  Indeterminate   +---------+---------------+---------+-----------+----------+---------------  ----+  PTV   None                      Age  Indeterminate   +---------+---------------+---------+-----------+----------+---------------  ----+  PERO                           Not well  visualized  +---------+---------------+---------+-----------+----------+---------------  ----+  EIV                           Not visualized      +---------+---------------+---------+-----------+----------+---------------  ----+   Summary:  RIGHT:  - No evidence of common femoral vein obstruction.    LEFT:  - Findings consistent with age indeterminate deep vein thrombosis involving the left common femoral vein, SF junction, left femoral vein, left proximal profunda vein, left popliteal vein, and left posterior tibial veins.  - No cystic structure found in the popliteal fossa.   Statin:  Yes.   Beta Blocker:  Yes.   Aspirin:  No. ACEI:  No. ARB:  No. CCB use:  No Other antiplatelets/anticoagulants:  Yes.   Heparin IV   ASSESSMENT/PLAN: This is a 59 y.o. male with extensive left lower extremity DVT involving the left common femoral vein, SFJ, femoral vein, proximal profunda femoral vein, left popliteal and posterior tibial veins. The other tibial veins were not well visualized and may be involved as well. He has no signs of critical limb ischemia or phlegmasia. His leg is well perfused and warm with palpable pulses. He is currently on IV heparin which seems to be improving his symptoms. Discussed medical management vs possibly needing mechanical thrombectomy. Recommend continuing him on IV heparin over next day or two to see if his symptoms continue to improve.  I have encouraged him to try and elevate his legs while he is in bed. The on call vascular surgeon Dr Trula Slade will see and evaluate patient later this afternoon to provide further management plans    Corrina Baglia PA-C [facility]  [phone] 12/06/2019  12:51 PM    I agree with the above.  Have seen and evaluated the patient.  Briefly this is a 59 year old male with morbid obesity who presented with a left leg DVT recently.  He was started on heparin.  We are asked for further recommendations.  The patient does have a trach for chronic COPD and was recently hospitalized.  His DVT ultrasound shows thrombus up to the common femoral vein but could not  evaluate the iliac veins.  Patient does not have vascular compromise of the leg.  There is no evidence of phlegmasia.  He is already on heparin.  He states that with the addition of heparin his leg tightness and pain has improved but is still present.  I have ordered a ileal caval ultrasound to evaluate for more proximal thrombus.  More than likely, the patient would be a candidate for mechanical thrombectomy.  He states that he would be able to lay prone on a table for the procedure.  This will likely be performed on Monday.  Please continue to keep him on heparin and his leg elevated.  I will also order a compression stocking.  Annamarie Major

## 2019-12-06 NOTE — Progress Notes (Signed)
PROGRESS NOTE    Timothy Le  KPT:[id number] DOB: Aug 09, 1960 DOA: 12/04/2019 PCP: Vevelyn Francois, NP    No chief complaint on file.   Brief Narrative:  59 year old male with PMH of morbid obesity (BMI 53), OSA, COPD, chronic diastolic heart failure, and chronic respiratory failure with tracheostomy in place who presented to the ED on 11/15 with left leg swelling found to have an extensive LEFT lower extremity DVT involving the left common femoral vein, SF junction, as well as left proximal profunda, popliteal, and posterior tibial veins.  CTA negative for PE.  On Day 2 of Heparin drip.   Assessment & Plan: Acute left lower extremity DVT. Doppler shows evidence of DVT extending from common femoral vein all the way to the distal vein system. CT PE negative for pulmonary embolism. Pain still unchanged. This is despite 48 hours of IV heparin. Significant edema as well. Vascular surgery was consulted.  Appreciate assistance. We will continue to monitor IV heparin for next 48 hours before transitioning to oral anticoagulation and monitor response. Given his 151 kg weight patient remains out of range for majority of the studies done for DOAC.  Will discuss with pharmacy. Oxycodone PRN for pain.  Chronic Hypoxic Respiratory Failure due to OSA/OHS and COPD:  Tracheostomy status. He wears BiPAP nightly. Follows up with pulmonary outpatient. Currently respiratory status stable. Monitor.  Chronic Diastolic Heart Failure:  Euvolemic.  Hold diuretics. Will resume tomorrow.  Insulin Dependent Diabetes:  Continue Levemir 17 units nightly and SSi with POC glucose q6hrs.  Depression/Anxiety:  Ok to continue home psych meds Seroquel, Zoloft, and Trazodone  Essential Hypertension:  BP ok.  Hold home meds.  Code Status: Full Family Communication: Discussed with patient. Disposition:   Status is: Inpatient  Remains inpatient appropriate because:Inpatient level of care appropriate  due to severity of illness still on IV heparin.   Dispo: The patient is from: Home              Anticipated d/c is to: Home              Anticipated d/c date is: 12/08/2019.              Consultants:   Vascular surgery  Procedures:   None  Antimicrobials:   None    Subjective: Pain still present.  No nausea no vomiting.  No fever no chills.  Objective: Vitals:   12/06/19 0949 12/06/19 1234 12/06/19 1603 12/06/19 1610  BP: 114/77   112/61  Pulse: 74 72 69 68  Resp:  18 16 17   Temp: 98.7 F (37.1 C)   98.4 F (36.9 C)  TempSrc: Oral   Oral  SpO2: 100% 100% 98% 92%  Weight:      Height:        Intake/Output Summary (Last 24 hours) at 12/06/2019 1840 Last data filed at 12/06/2019 1600 Gross per 24 hour  Intake 1251.25 ml  Output --  Net 1251.25 ml   Filed Weights   12/04/19 2000  Weight: (!) 151.1 kg    Examination:  General: Appear in mild distress, no Rash; Oral Mucosa Clear, moist. no Abnormal Neck Mass Or lumps, Conjunctiva normal  Cardiovascular: S1 and S2 Present, no Murmur, Respiratory: good respiratory effort, Bilateral Air entry present and CTA, basal Crackles, no wheezes Abdomen: Bowel Sound present, Soft and no tenderness Extremities: left Pedal edema Neurology: alert and oriented to time, place, and person affect appropriate. no new focal deficit Gait not checked  due to patient safety concerns   Data Reviewed: I have personally reviewed following labs and imaging studies  CBC: Recent Labs  Lab 12/04/19 1722 12/05/19 0523 12/06/19 0239  WBC 11.4* 8.7 7.3  HGB 12.8* 12.4* 12.0*  HCT 42.2 40.3 38.3*  MCV 86.8 86.7 85.1  PLT 174 164 892    Basic Metabolic Panel: Recent Labs  Lab 12/04/19 1722 12/05/19 0523 12/06/19 0239  NA 138 138 136  K 3.8 3.6 3.8  CL 96* 99 98  CO2 27 25 27   GLUCOSE 279* 154* 164*  BUN 15 17 17   CREATININE 1.34* 1.07 1.13  CALCIUM 9.1 9.1 8.7*    GFR: Estimated Creatinine Clearance: 102.3 mL/min (by  C-G formula based on SCr of 1.13 mg/dL).  Liver Function Tests: No results for input(s): AST, ALT, ALKPHOS, BILITOT, PROT, ALBUMIN in the last 168 hours.  CBG: Recent Labs  Lab 12/05/19 1622 12/05/19 2048 12/06/19 0700 12/06/19 1131 12/06/19 1612  GLUCAP 141* 165* 156* 213* 128*     Recent Results (from the past 240 hour(s))  Respiratory Panel by RT PCR (Flu A&B, Covid) - Nasopharyngeal Swab     Status: None   Collection Time: 12/04/19 10:55 PM   Specimen: Nasopharyngeal Swab  Result Value Ref Range Status   SARS Coronavirus 2 by RT PCR NEGATIVE NEGATIVE Final    Comment: (NOTE) SARS-CoV-2 target nucleic acids are NOT DETECTED.  The SARS-CoV-2 RNA is generally detectable in upper respiratoy specimens during the acute phase of infection. The lowest concentration of SARS-CoV-2 viral copies this assay can detect is 131 copies/mL. A negative result does not preclude SARS-Cov-2 infection and should not be used as the sole basis for treatment or other patient management decisions. A negative result may occur with  improper specimen collection/handling, submission of specimen other than nasopharyngeal swab, presence of viral mutation(s) within the areas targeted by this assay, and inadequate number of viral copies (<131 copies/mL). A negative result must be combined with clinical observations, patient history, and epidemiological information. The expected result is Negative.  Fact Sheet for Patients:  PinkCheek.be  Fact Sheet for Healthcare Providers:  GravelBags.it  This test is no t yet approved or cleared by the [place] FDA and  has been authorized for detection and/or diagnosis of SARS-CoV-2 by FDA under an Emergency Use Authorization (EUA). This EUA will remain  in effect (meaning this test can be used) for the duration of the COVID-19 declaration under Section 564(b)(1) of the Act, 21 U.S.C. section  360bbb-3(b)(1), unless the authorization is terminated or revoked sooner.     Influenza A by PCR NEGATIVE NEGATIVE Final   Influenza B by PCR NEGATIVE NEGATIVE Final    Comment: (NOTE) The Xpert Xpress SARS-CoV-2/FLU/RSV assay is intended as an aid in  the diagnosis of influenza from Nasopharyngeal swab specimens and  should not be used as a sole basis for treatment. Nasal washings and  aspirates are unacceptable for Xpert Xpress SARS-CoV-2/FLU/RSV  testing.  Fact Sheet for Patients: PinkCheek.be  Fact Sheet for Healthcare Providers: GravelBags.it  This test is not yet approved or cleared by the [place] FDA and  has been authorized for detection and/or diagnosis of SARS-CoV-2 by  FDA under an Emergency Use Authorization (EUA). This EUA will remain  in effect (meaning this test can be used) for the duration of the  Covid-19 declaration under Section 564(b)(1) of the Act, 21  U.S.C. section 360bbb-3(b)(1), unless the authorization is  terminated or revoked. Performed at [facility]  [facility], [place] [address] [place], [place] [address]      Radiology Studies: CT Angio Chest PE W/Cm &/Or Wo Cm  Result Date: 12/04/2019 CLINICAL DATA:  59 year old male with concern for pulmonary embolism. EXAM: CT ANGIOGRAPHY CHEST WITH CONTRAST TECHNIQUE: Multidetector CT imaging of the chest was performed using the standard protocol during bolus administration of intravenous contrast. Multiplanar CT image reconstructions and MIPs were obtained to evaluate the vascular anatomy. CONTRAST:  39mL OMNIPAQUE IOHEXOL 350 MG/ML SOLN COMPARISON:  Chest CT dated 07/05/2018 and radiograph dated 09/19/2019. FINDINGS: Cardiovascular: There is no cardiomegaly or pericardial effusion. The thoracic aorta is unremarkable. The origins of the great vessels of the aortic arch appear patent as visualized. There is no CT evidence of pulmonary embolism.  Mediastinum/Nodes: There is no hilar or mediastinal adenopathy. The esophagus is grossly unremarkable. Lungs/Pleura: There are bibasilar linear atelectasis/scarring. No focal consolidation, pleural effusion, pneumothorax. The central airways are patent. Tracheostomy above the carina. Upper Abdomen: No acute abnormality. Musculoskeletal: Degenerative changes of the spine. Anterior spinal osteophyte compatible with DISH. No acute osseous pathology. Review of the MIP images confirms the above findings. IMPRESSION: No acute intrathoracic pathology. No CT evidence of pulmonary embolism. Electronically Signed   By: Anner Crete M.D.   On: 12/04/2019 19:56   VAS [place] LOWER EXTREMITY VENOUS (DVT) (ONLY [facility] & [facility])  Result Date: 12/05/2019  Lower Venous DVT Study Indications: Edema.  Limitations: Body habitus and poor ultrasound/tissue interface. Comparison Study: 07/31/19 previous Performing Technologist: Abram Sander RVS  Examination Guidelines: A complete evaluation includes B-mode imaging, spectral Doppler, color Doppler, and power Doppler as needed of all accessible portions of each vessel. Bilateral testing is considered an integral part of a complete examination. Limited examinations for reoccurring indications may be performed as noted. The reflux portion of the exam is performed with the patient in reverse Trendelenburg.  +-----+---------------+---------+-----------+----------+--------------+  RIGHT Compressibility Phasicity Spontaneity Properties Thrombus Aging  +-----+---------------+---------+-----------+----------+--------------+  CFV   Full            Yes       Yes                                    +-----+---------------+---------+-----------+----------+--------------+   +---------+---------------+---------+-----------+----------+-------------------+  LEFT      Compressibility Phasicity Spontaneity Properties Thrombus Aging        +---------+---------------+---------+-----------+----------+-------------------+  CFV       None            No        No                     Age Indeterminate    +---------+---------------+---------+-----------+----------+-------------------+  SFJ       None                                             Age Indeterminate    +---------+---------------+---------+-----------+----------+-------------------+  FV Prox   None                                             Age Indeterminate    +---------+---------------+---------+-----------+----------+-------------------+  FV Mid    None  Age Indeterminate    +---------+---------------+---------+-----------+----------+-------------------+  FV Distal                 No        No                     Age Indeterminate    +---------+---------------+---------+-----------+----------+-------------------+  PFV       None                                             Age Indeterminate    +---------+---------------+---------+-----------+----------+-------------------+  POP       None            No        No                     Age Indeterminate    +---------+---------------+---------+-----------+----------+-------------------+  PTV       None                                             Age Indeterminate    +---------+---------------+---------+-----------+----------+-------------------+  PERO                                                       Not well visualized  +---------+---------------+---------+-----------+----------+-------------------+  EIV                                                        Not visualized       +---------+---------------+---------+-----------+----------+-------------------+     Summary: RIGHT: - No evidence of common femoral vein obstruction.  LEFT: - Findings consistent with age indeterminate deep vein thrombosis involving the left common femoral vein, SF junction, left femoral vein, left proximal profunda vein, left  popliteal vein, and left posterior tibial veins. - No cystic structure found in the popliteal fossa.  *See table(s) above for measurements and observations. Electronically signed by Curt Jews MD on 12/05/2019 at 4:41:28 PM.    Final      Scheduled Meds:  allopurinol  100 mg Oral Daily   atorvastatin  10 mg Oral Daily   diclofenac Sodium  2 g Topical QID   gabapentin  200 mg Oral TID   insulin aspart  0-5 Units Subcutaneous QHS   insulin aspart  0-9 Units Subcutaneous TID WC   insulin detemir  17 Units Subcutaneous BID   metoprolol tartrate  12.5 mg Oral BID   mometasone-formoterol  2 puff Inhalation BID   pantoprazole  40 mg Oral QHS   QUEtiapine  100 mg Oral BID   sertraline  100 mg Oral Daily   Continuous Infusions:  sodium chloride 10 mL/hr at 12/04/19 2254   heparin 1,900 Units/hr (12/06/19 1115)     LOS: 1 day    Time spent: 30 minutes   Berle Mull, MD [facility]   To contact the attending provider between 7A-7P or the  covering provider during after hours 7P-7A, please log into the web site [email or web address] and access using universal [place] password for that web site. If you do not have the password, please call the hospital operator.  12/06/2019, 6:40 PM

## 2019-12-06 NOTE — Plan of Care (Signed)

## 2019-12-06 NOTE — Progress Notes (Signed)
ANTICOAGULATION CONSULT NOTE  Pharmacy Consult for Heparin Indication: DVT  No Known Allergies  Patient Measurements: Height: 5\' 8"  (172.7 cm) Weight: (!) 151.1 kg (333 lb 3.2 oz) IBW/kg (Calculated) : 68.4 Heparin Dosing Weight: 105.2 kg  Vital Signs: Temp: 98 F (36.7 C) (11/17 0328) Temp Source: Oral (11/17 0328) BP: 111/62 (11/17 0328) Pulse Rate: 72 (11/17 0341)  Labs: Recent Labs    12/04/19 1722 12/04/19 1722 12/05/19 0523 12/05/19 0523 12/05/19 1400 12/05/19 1555 12/06/19 0239  HGB 12.8*   < > 12.4*  --   --   --  12.0*  HCT 42.2  --  40.3  --   --   --  38.3*  PLT 174  --  164  --   --   --  169  HEPARINUNFRC  --   --  0.29*   < > >2.20* 0.55 0.56  CREATININE 1.34*  --  1.07  --   --   --  1.13   < > = values in this interval not displayed.    Estimated Creatinine Clearance: 102.3 mL/min (by C-G formula based on SCr of 1.13 mg/dL).   Assessment: 19 YOM with acute LLE DVT to continue on IV heparin.  Heparin level is therapeutic; no bleeding reported.  Goal of Therapy:  Heparin level 0.3-0.7 units/ml Monitor platelets by anticoagulation protocol: Yes   Plan:  Continue heparin gtt at 1900 units/hr Daily heparin level and CBC F/U with transitioning to oral [facility]  Mylan Schwarz D. Mina Marble, PharmD, BCPS, [place] 12/06/2019, 8:08 AM

## 2019-12-07 ENCOUNTER — Inpatient Hospital Stay (HOSPITAL_COMMUNITY): Payer: Medicaid Other

## 2019-12-07 ENCOUNTER — Telehealth: Payer: Self-pay

## 2019-12-07 DIAGNOSIS — I82409 Acute embolism and thrombosis of unspecified deep veins of unspecified lower extremity: Secondary | ICD-10-CM | POA: Diagnosis not present

## 2019-12-07 DIAGNOSIS — I824Z2 Acute embolism and thrombosis of unspecified deep veins of left distal lower extremity: Secondary | ICD-10-CM | POA: Diagnosis not present

## 2019-12-07 LAB — BASIC METABOLIC PANEL
Anion gap: 11 (ref 5–15)
BUN: 16 mg/dL (ref 6–20)
CO2: 26 mmol/L (ref 22–32)
Calcium: 8.7 mg/dL — ABNORMAL LOW (ref 8.9–10.3)
Chloride: 99 mmol/L (ref 98–111)
Creatinine, Ser: 1.01 mg/dL (ref 0.61–1.24)
GFR, Estimated: >60 mL/min (ref >60)
Glucose, Bld: 152 mg/dL — ABNORMAL HIGH (ref 70–99)
Potassium: 4 mmol/L (ref 3.5–5.1)
Sodium: 136 mmol/L (ref 135–145)

## 2019-12-07 LAB — CBC
HCT: 37.2 % — ABNORMAL LOW (ref 39.0–52.0)
Hemoglobin: 11.5 g/dL — ABNORMAL LOW (ref 13.0–17.0)
MCH: 26.6 pg (ref 26.0–34.0)
MCHC: 30.9 g/dL (ref 30.0–36.0)
MCV: 86.1 fL (ref 80.0–100.0)
Platelets: 124 10*3/uL — ABNORMAL LOW (ref 150–400)
RBC: 4.32 MIL/uL (ref 4.22–5.81)
RDW: 15.8 % — ABNORMAL HIGH (ref 11.5–15.5)
WBC: 5.7 10*3/uL (ref 4.0–10.5)
nRBC: 0 % (ref 0.0–0.2)

## 2019-12-07 LAB — GLUCOSE, CAPILLARY
Glucose-Capillary: 145 mg/dL — ABNORMAL HIGH (ref 70–99)
Glucose-Capillary: 114 mg/dL — ABNORMAL HIGH (ref 70–99)
Glucose-Capillary: 113 mg/dL — ABNORMAL HIGH (ref 70–99)
Glucose-Capillary: 131 mg/dL — ABNORMAL HIGH (ref 70–99)

## 2019-12-07 LAB — HEPARIN LEVEL (UNFRACTIONATED): Heparin Unfractionated: 0.41 IU/mL (ref 0.30–0.70)

## 2019-12-07 NOTE — Progress Notes (Addendum)
[facility] Progress Note  Patient: Timothy Le    KGM:[id number]  [place]: 12/04/2019     Date of Service: the patient was seen and examined on 12/07/2019  Brief hospital course: 59 year old male with PMH of morbid obesity (BMI 51), OSA, COPD, chronic diastolic heart failure, and chronic respiratory failure with tracheostomy in place who presented to the ED on 11/15 with left leg swelling found to have an extensive LEFT lower extremity DVT involving the left common femoral vein, SF junction, as well as left proximal profunda, popliteal, and posterior tibial veins.  CTA negative for PE. Currently plan is continue IV heparin and monitor for improvement.  Assessment and Plan: Acute left lower extremity DVT. Doppler shows evidence of DVT extending from common femoral vein all the way to the distal vein system. CT PE negative for pulmonary embolism. Pain still unchanged. Significant edema as well. Vascular surgery was consulted.  Appreciate assistance. We will continue to monitor IV heparin for next 48 hours before transitioning to oral anticoagulation and monitor response. Given his 151 kg weight patient remains out of range for majority of the studies done for DOAC.    Discussed about potential for being on Coumadin.  Patient currently mentions he only wants to get better. Oxycodone PRN for pain.  Chronic Hypoxic Respiratory Failure due to OSA/OHS and COPD:  Tracheostomy status. He wears BiPAP nightly. Follows up with pulmonary outpatient. Currently respiratory status stable. Monitor.  Chronic Diastolic Heart Failure:  Euvolemic.  Hold diuretics. Will resume tomorrow.  Insulin Dependent Diabetes:  Continue Levemir 17 units nightly and SSi with POC glucose q6hrs.  Depression/Anxiety:  Ok to continue home psych meds Seroquel, Zoloft, and Trazodone  Essential Hypertension:  BP ok.  Hold home meds.  Morbid obesity Placing the patient high risk for poor outcome. Also  at risk for recurrent DVTs and does make me suspect that he may require long-term anticoagulation instead of 3 months. Also limiting options given his high BMI for anticoagulation. Body mass index is 50.66 kg/m.   Diet: Cardiac and carb  Advance goals of care discussion: Full code  Family Communication: no family was present at bedside, at the time of interview.   Disposition:  Status is: Inpatient  Remains inpatient appropriate because:IV treatments appropriate due to intensity of illness or inability to take PO  Dispo: The patient is from: Home              Anticipated d/c is to: Home              Anticipated d/c date is: 3 days              Patient currently is not medically stable to d/c.  Subjective: Continues to have worsening pain in his leg.  No nausea no vomiting.  No fever no chills.  No chest pain.  Physical Exam:  General: Appear in mild distress, no Rash; Oral Mucosa Clear, moist. no Abnormal Neck Mass Or lumps, Conjunctiva normal  Cardiovascular: S1 and S2 Present, no Murmur, Respiratory: good respiratory effort, Bilateral Air entry present and CTA, no Crackles, no wheezes Abdomen: Bowel Sound present, Soft and no tenderness Extremities: bilateral, left>right Pedal edema Neurology: alert and oriented to time, place, and person affect appropriate. no new focal deficit Gait not checked due to patient safety concerns  Vitals:   12/07/19 1114 12/07/19 1204 12/07/19 1609 12/07/19 1658  BP: (!) 117/51   125/72  Pulse: 66 68 65 65  Resp: 16 16  17 18  Temp: 98.1 F (36.7 C)   99.1 F (37.3 C)  TempSrc: Oral   Oral  SpO2: 100% 94% 97% 95%  Weight:      Height:        Intake/Output Summary (Last 24 hours) at 12/07/2019 1832 Last data filed at 12/07/2019 1637 Gross per 24 hour  Intake 800.96 ml  Output 1280 ml  Net -479.04 ml   Filed Weights   12/04/19 2000  Weight: (!) 151.1 kg    Data Reviewed: I have personally reviewed and interpreted daily labs,  tele strips, imagings as discussed above. I reviewed all nursing notes, pharmacy notes, vitals, pertinent old records I have discussed plan of care as described above with RN and patient/family.  CBC: Recent Labs  Lab 12/04/19 1722 12/05/19 0523 12/06/19 0239 12/07/19 0435  WBC 11.4* 8.7 7.3 5.7  HGB 12.8* 12.4* 12.0* 11.5*  HCT 42.2 40.3 38.3* 37.2*  MCV 86.8 86.7 85.1 86.1  PLT 174 164 169 664*   Basic Metabolic Panel: Recent Labs  Lab 12/04/19 1722 12/05/19 0523 12/06/19 0239 12/07/19 0435  NA 138 138 136 136  K 3.8 3.6 3.8 4.0  CL 96* 99 98 99  CO2 27 25 27 26   GLUCOSE 279* 154* 164* 152*  BUN 15 17 17 16   CREATININE 1.34* 1.07 1.13 1.01  CALCIUM 9.1 9.1 8.7* 8.7*    Studies: VAS [place] IVC/ILIAC (VENOUS ONLY)  Result Date: 12/07/2019 IVC/ILIAC STUDY Indications: Extensive left lower extremity DVT found 12/04/19, including CFV,              FV, PFV, Pop, PT, and peroneal veins Limitations: Air/bowel gas and obesity (BMI >50).  Comparison Study: No prior study of IVC/Iliac on file Performing Technologist: Sharion Dove RVS  Examination Guidelines: A complete evaluation includes B-mode imaging, spectral Doppler, color Doppler, and power Doppler as needed of all accessible portions of each vessel. Bilateral testing is considered an integral part of a complete examination. Limited examinations for reoccurring indications may be performed as noted.  Summary: IVC/Iliac: There is no evidence of thrombus involving the right common iliac vein. Unable to visualize the left external or internal iliac secondary to bowel gas and body habitus.  *See table(s) above for measurements and observations.  Electronically signed by Servando Snare MD on 12/07/2019 at 3:23:58 PM.   Final     Scheduled Meds: . allopurinol  100 mg Oral Daily  . atorvastatin  10 mg Oral Daily  . diclofenac Sodium  2 g Topical QID  . gabapentin  200 mg Oral TID  . insulin aspart  0-5 Units Subcutaneous QHS  .  insulin aspart  0-9 Units Subcutaneous TID WC  . insulin detemir  17 Units Subcutaneous BID  . metoprolol tartrate  12.5 mg Oral BID  . mometasone-formoterol  2 puff Inhalation BID  . pantoprazole  40 mg Oral QHS  . QUEtiapine  100 mg Oral BID  . sertraline  100 mg Oral Daily   Continuous Infusions: . sodium chloride 10 mL/hr at 12/07/19 1300  . heparin 1,900 Units/hr (12/07/19 1517)   PRN Meds: acetaminophen **OR** acetaminophen, bisacodyl, ipratropium-albuterol, ondansetron **OR** ondansetron (ZOFRAN) IV, oxyCODONE, polyethylene glycol, traZODone  Time spent: 35 minutes  Author: Berle Mull, MD [facility] 12/07/2019 6:32 PM  To reach On-call, see care teams to locate the attending and reach out via [email or web address]. Between 7PM-7AM, please contact night-coverage If you still have difficulty reaching the attending provider, please page the [facility] (Director on  Call) for [facility] on amion for assistance.

## 2019-12-07 NOTE — Progress Notes (Signed)
  Progress Note    12/07/2019 7:36 AM * No surgery found *  Subjective:  No change in LLE edema per patient.  Still uncomfortable   Vitals:   12/07/19 0007 12/07/19 0303  BP:    Pulse: 66 68  Resp: 18 17  Temp:  98.1 F (36.7 C)  SpO2: 96% 95%   Physical Exam: Extremities:  LLE edematous; no phlegmasia; palpable L DP Abdomen:  soft Neurologic: A&O  CBC    Component Value Date/Time   WBC 5.7 12/07/2019 0435   RBC 4.32 12/07/2019 0435   HGB 11.5 (L) 12/07/2019 0435   HCT 37.2 (L) 12/07/2019 0435   PLT 124 (L) 12/07/2019 0435   MCV 86.1 12/07/2019 0435   MCH 26.6 12/07/2019 0435   MCHC 30.9 12/07/2019 0435   RDW 15.8 (H) 12/07/2019 0435   LYMPHSABS 1.9 09/22/2019 1915   MONOABS 0.8 09/22/2019 1915   EOSABS 0.2 09/22/2019 1915   BASOSABS 0.1 09/22/2019 1915    BMET    Component Value Date/Time   NA 136 12/07/2019 0435   NA 144 10/12/2019 0904   K 4.0 12/07/2019 0435   CL 99 12/07/2019 0435   CO2 26 12/07/2019 0435   GLUCOSE 152 (H) 12/07/2019 0435   BUN 16 12/07/2019 0435   BUN 23 10/12/2019 0904   CREATININE 1.01 12/07/2019 0435   CALCIUM 8.7 (L) 12/07/2019 0435   GFRNONAA >60 12/07/2019 0435   GFRAA 85 10/12/2019 0904    INR    Component Value Date/Time   INR 1.2 08/30/2019 0258     Intake/Output Summary (Last 24 hours) at 12/07/2019 0736 Last data filed at 12/06/2019 1600 Gross per 24 hour  Intake 282.01 ml  Output --  Net 282.01 ml     Assessment/Plan:  59 y.o. male with extensive L LE DVT  Subjectively no improvement in LLE edema and pain Continue IV heparin and leg elevation Dr. Trula Slade will follow up with plans for mechanical thrombectomy vs medical management   Dagoberto Ligas, PA-C [facility] [phone] 12/07/2019 7:36 AM

## 2019-12-07 NOTE — Telephone Encounter (Signed)
Ranj called to ask if APP was okay with advancing pt's diet since pt has had abd u/s. She stated Dr. Posey Pronto was in agreement with this and APP said it is acceptable from his standpoint. Ranj verbalized understanding.

## 2019-12-07 NOTE — Plan of Care (Signed)
  Problem: Education: Goal: Knowledge of General Education information will improve Description: Including pain rating scale, medication(s)/side effects and non-pharmacologic comfort measures Outcome: Progressing   Problem: Clinical Measurements: Goal: Ability to maintain clinical measurements within normal limits will improve Outcome: Progressing Goal: Will remain free from infection Outcome: Progressing   

## 2019-12-07 NOTE — Progress Notes (Signed)
VASCULAR LAB     IVC/Illiac duplex  has been performed.  See CV proc for preliminary results.   Jerene Yeager, RVT 12/07/2019, 9:46 AM

## 2019-12-07 NOTE — Progress Notes (Signed)
ANTICOAGULATION CONSULT NOTE  Pharmacy Consult for Heparin Indication: DVT  No Known Allergies  Patient Measurements: Height: 5\' 8"  (172.7 cm) Weight: (!) 151.1 kg (333 lb 3.2 oz) IBW/kg (Calculated) : 68.4 Heparin Dosing Weight: 105.2 kg  Vital Signs: Temp: 98.6 F (37 C) (11/18 0827) Temp Source: Oral (11/18 0827) BP: 116/60 (11/18 0827) Pulse Rate: 70 (11/18 0827)  Labs: Recent Labs    12/05/19 0523 12/05/19 0523 12/05/19 1400 12/05/19 1555 12/06/19 0239 12/07/19 0435  HGB 12.4*   < >  --   --  12.0* 11.5*  HCT 40.3  --   --   --  38.3* 37.2*  PLT 164  --   --   --  169 124*  HEPARINUNFRC 0.29*  --    < > 0.55 0.56 0.41  CREATININE 1.07  --   --   --  1.13 1.01   < > = values in this interval not displayed.    Estimated Creatinine Clearance: 114.5 mL/min (by C-G formula based on SCr of 1.01 mg/dL).   Assessment: 27 YOM with acute LLE DVT to continue on IV heparin.  Heparin level is therapeutic; no bleeding reported.  Goal of Therapy:  Heparin level 0.3-0.7 units/ml Monitor platelets by anticoagulation protocol: Yes   Plan:  Continue heparin gtt at 1900 units/hr Daily heparin level and CBC F/U thrombectomy versus transitioning to oral [facility]  Alanda Slim, PharmD, [facility] Clinical Pharmacist Please see AMION for all Pharmacists' Contact Phone Numbers 12/07/2019, 8:44 AM

## 2019-12-07 NOTE — Plan of Care (Signed)

## 2019-12-08 DIAGNOSIS — I824Z2 Acute embolism and thrombosis of unspecified deep veins of left distal lower extremity: Secondary | ICD-10-CM | POA: Diagnosis not present

## 2019-12-08 LAB — CBC
HCT: 36.8 % — ABNORMAL LOW (ref 39.0–52.0)
Hemoglobin: 11.3 g/dL — ABNORMAL LOW (ref 13.0–17.0)
MCH: 26.5 pg (ref 26.0–34.0)
MCHC: 30.7 g/dL (ref 30.0–36.0)
MCV: 86.2 fL (ref 80.0–100.0)
Platelets: 172 10*3/uL (ref 150–400)
RBC: 4.27 MIL/uL (ref 4.22–5.81)
RDW: 15.5 % (ref 11.5–15.5)
WBC: 5.8 10*3/uL (ref 4.0–10.5)
nRBC: 0 % (ref 0.0–0.2)

## 2019-12-08 LAB — HEPARIN LEVEL (UNFRACTIONATED)
Heparin Unfractionated: <0.1 IU/mL — ABNORMAL LOW (ref 0.30–0.70)
Heparin Unfractionated: <0.1 IU/mL — ABNORMAL LOW (ref 0.30–0.70)
Heparin Unfractionated: 0.44 IU/mL (ref 0.30–0.70)

## 2019-12-08 LAB — BASIC METABOLIC PANEL
Anion gap: 7 (ref 5–15)
BUN: 17 mg/dL (ref 6–20)
CO2: 31 mmol/L (ref 22–32)
Calcium: 9.1 mg/dL (ref 8.9–10.3)
Chloride: 101 mmol/L (ref 98–111)
Creatinine, Ser: 1.24 mg/dL (ref 0.61–1.24)
GFR, Estimated: >60 mL/min (ref >60)
Glucose, Bld: 157 mg/dL — ABNORMAL HIGH (ref 70–99)
Potassium: 4.4 mmol/L (ref 3.5–5.1)
Sodium: 139 mmol/L (ref 135–145)

## 2019-12-08 LAB — GLUCOSE, CAPILLARY
Glucose-Capillary: 131 mg/dL — ABNORMAL HIGH (ref 70–99)
Glucose-Capillary: 140 mg/dL — ABNORMAL HIGH (ref 70–99)
Glucose-Capillary: 116 mg/dL — ABNORMAL HIGH (ref 70–99)
Glucose-Capillary: 147 mg/dL — ABNORMAL HIGH (ref 70–99)

## 2019-12-08 MED ORDER — HEPARIN BOLUS VIA INFUSION
3000.0000 [IU] | Freq: Once | INTRAVENOUS | Status: AC
  Administered 2019-12-08: 3000 [IU] via INTRAVENOUS
  Filled 2019-12-08: qty 3000

## 2019-12-08 NOTE — Progress Notes (Signed)
ANTICOAGULATION CONSULT NOTE  Pharmacy Consult for Heparin Indication: DVT  No Known Allergies  Patient Measurements: Height: 5\' 8"  (172.7 cm) Weight: (!) 151.1 kg (333 lb 3.2 oz) IBW/kg (Calculated) : 68.4 Heparin Dosing Weight: 105.2 kg  Vital Signs: Temp: 98.3 F (36.8 C) (11/19 0853) Temp Source: Oral (11/19 0853) BP: 130/72 (11/19 0853) Pulse Rate: 68 (11/19 1227)  Labs: Recent Labs    12/06/19 0239 12/06/19 0239 12/07/19 0435 12/07/19 0435 12/08/19 0304 12/08/19 0457 12/08/19 1504  HGB 12.0*   < > 11.5*  --  11.3*  --   --   HCT 38.3*  --  37.2*  --  36.8*  --   --   PLT 169  --  124*  --  172  --   --   HEPARINUNFRC 0.56   < > 0.41   < > <0.10* <0.10* 0.44  CREATININE 1.13  --  1.01  --  1.24  --   --    < > = values in this interval not displayed.    Estimated Creatinine Clearance: 93.2 mL/min (by C-G formula based on SCr of 1.24 mg/dL).   Assessment: 81 YOM with acute LLE DVT for heparin.   Heparin level therapeutic now   Goal of Therapy:  Heparin level 0.3-0.7 units/ml Monitor platelets by anticoagulation protocol: Yes   Plan:  Increase heparin to 2100 units / hr to keep in therapeutic range Next level in AM  Thank you Anette Guarneri, PharmD  12/08/2019, 3:58 PM

## 2019-12-08 NOTE — Plan of Care (Signed)
  Problem: Safety: Goal: Ability to remain free from injury will improve Outcome: Progressing   

## 2019-12-08 NOTE — Progress Notes (Signed)
[facility] Progress Note  Patient: Timothy Le    JKD:[id number]  Katie: 12/04/2019     Date of Service: the patient was seen and examined on 12/08/2019  Brief hospital course: 59 year old male with PMH of morbid obesity (BMI 51), OSA, COPD, chronic diastolic heart failure, and chronic respiratory failure with tracheostomy in place who presented to the ED on 11/15 with left leg swelling found to have an extensive LEFT lower extremity DVT involving the left common femoral vein, SF junction, as well as left proximal profunda, popliteal, and posterior tibial veins.  CTA negative for PE. Currently plan is continue IV heparin and monitor for improvement.  Assessment and Plan: Acute left lower extremity DVT. Doppler shows evidence of DVT extending from common femoral vein all the way to the distal vein system. CT PE negative for pulmonary embolism. Pain still unchanged. Significant edema as well. Vascular surgery was consulted.  Appreciate assistance. Continue IV heparin. Temporarily scheduled for intervention on Monday. Oxycodone PRN for pain.  Chronic Hypoxic Respiratory Failure due to OSA/OHS and COPD:  Tracheostomy status. He wears BiPAP nightly. Follows up with pulmonary outpatient. Currently respiratory status stable. Monitor.  Chronic Diastolic Heart Failure:  Euvolemic.  Hold diuretics. Will resume tomorrow.  Insulin Dependent Diabetes:  Continue Levemir 17 units nightly and SSi with POC glucose q6hrs.  Depression/Anxiety:  Ok to continue home psych meds Seroquel, Zoloft, and Trazodone  Essential Hypertension:  BP on the softer side. Hold Lopressor.  Hold home meds.  Morbid obesity Placing the patient high risk for poor outcome. Also at risk for recurrent DVTs and does make me suspect that he may require long-term anticoagulation instead of 3 months. Also limiting options given his high BMI for anticoagulation. Body mass index is 50.66 kg/m.   Diet:  Cardiac and carb  Advance goals of care discussion: Full code  Family Communication: no family was present at bedside, at the time of interview.   Disposition:  Status is: Inpatient  Remains inpatient appropriate because:IV treatments appropriate due to intensity of illness or inability to take PO  Dispo: The patient is from: Home              Anticipated d/c is to: Home              Anticipated d/c date is: 3 days              Patient currently is not medically stable to d/c.  Subjective: Pain actually improving.  Patient reports swelling is also improving.  Physical Exam:  General: Appear in mild distress, no Rash; Oral Mucosa Clear, moist. no Abnormal Neck Mass Or lumps, Conjunctiva normal  Cardiovascular: S1 and S2 Present, no Murmur, Respiratory: good respiratory effort, Bilateral Air entry present and CTA, no Crackles, no wheezes Abdomen: Bowel Sound present, Soft and no tenderness Extremities: bilateral, left>right Pedal edema Neurology: alert and oriented to time, place, and person affect appropriate. no new focal deficit Gait not checked due to patient safety concerns  Vitals:   12/08/19 0853 12/08/19 1227 12/08/19 1549 12/08/19 1603  BP: 130/72   97/72  Pulse: 62 68 68 71  Resp: 18 20 18 15   Temp: 98.3 F (36.8 C)   98.2 F (36.8 C)  TempSrc: Oral   Oral  SpO2: 99% 98% 93% 100%  Weight:      Height:        Intake/Output Summary (Last 24 hours) at 12/08/2019 1854 Last data filed at 12/08/2019 0900 Gross  per 24 hour  Intake 480 ml  Output 1150 ml  Net -670 ml   Filed Weights   12/04/19 2000  Weight: (!) 151.1 kg    Data Reviewed: I have personally reviewed and interpreted daily labs, tele strips, imagings as discussed above. I reviewed all nursing notes, pharmacy notes, vitals, pertinent old records I have discussed plan of care as described above with RN and patient/family.  CBC: Recent Labs  Lab 12/04/19 1722 12/05/19 0523 12/06/19 0239  12/07/19 0435 12/08/19 0304  WBC 11.4* 8.7 7.3 5.7 5.8  HGB 12.8* 12.4* 12.0* 11.5* 11.3*  HCT 42.2 40.3 38.3* 37.2* 36.8*  MCV 86.8 86.7 85.1 86.1 86.2  PLT 174 164 169 124* 224   Basic Metabolic Panel: Recent Labs  Lab 12/04/19 1722 12/05/19 0523 12/06/19 0239 12/07/19 0435 12/08/19 0304  NA 138 138 136 136 139  K 3.8 3.6 3.8 4.0 4.4  CL 96* 99 98 99 101  CO2 27 25 27 26 31   GLUCOSE 279* 154* 164* 152* 157*  BUN 15 17 17 16 17   CREATININE 1.34* 1.07 1.13 1.01 1.24  CALCIUM 9.1 9.1 8.7* 8.7* 9.1    Studies: No results found.  Scheduled Meds: . allopurinol  100 mg Oral Daily  . atorvastatin  10 mg Oral Daily  . diclofenac Sodium  2 g Topical QID  . gabapentin  200 mg Oral TID  . insulin aspart  0-5 Units Subcutaneous QHS  . insulin aspart  0-9 Units Subcutaneous TID WC  . insulin detemir  17 Units Subcutaneous BID  . mometasone-formoterol  2 puff Inhalation BID  . pantoprazole  40 mg Oral QHS  . QUEtiapine  100 mg Oral BID  . sertraline  100 mg Oral Daily   Continuous Infusions: . sodium chloride 10 mL/hr at 12/07/19 1300  . heparin 2,100 Units/hr (12/08/19 1450)   PRN Meds: acetaminophen **OR** acetaminophen, bisacodyl, ipratropium-albuterol, ondansetron **OR** ondansetron (ZOFRAN) IV, oxyCODONE, polyethylene glycol, traZODone  Time spent: 35 minutes  Author: Berle Mull, MD [facility] 12/08/2019 6:54 PM  To reach On-call, see care teams to locate the attending and reach out via [email or web address]. Between 7PM-7AM, please contact night-coverage If you still have difficulty reaching the attending provider, please page the [employer] (Director on Call) for [facility] on amion for assistance.

## 2019-12-08 NOTE — TOC Initial Note (Addendum)
Transition of Care [facility]) - Initial/Assessment Note    Patient Details  Name: Timothy Le MRN: [id number] Date of Birth: November 09, 1960  [facility] CM/SW Contact:    Curlene Labrum, RN Phone Number: 12/08/2019, 1:03 PM  Clinical Narrative:                 Case management spoke with the patient at the bedside regarding transitions of care to home - most probably on Monday, 12/11/2019 - S/P lower extremity DVT.  The patient lives at home alone and provides himself self-trach care and suctioning as needed.  The patient is currently on 28% humidified oxygen in the hospital room at this time and is able to communicate effectively to staff.  The patient states that he normally does not use oxygen through the trach during the day but uses humidified oxygen at nighttime at home.  The patient states that he has all needed dme at home currently including oxygen, trach supplies, suction equipment, cane, and walker.  The patient usually uses [facility] for medical transportation.  The patient plans on being discharged on Monday to home via car.  He plans to have a friend pick him up by car - otherwise he states he can use [employer] transportation.  The patient currently [facility] at this time.  I called Ramond Marrow, RNCM with [facility] and she stated that the patient is active with RN.  Order placed in Epic to cover for continued services through home health.  Will continue to follow the patient for discharge needs.  12/08/2019 Ramond Marrow, CM with [facility] called back and stated that she was unaware that the patient was discharged from their home health services prior to the patients hospitalization.  The patient does not have active home health support at home according to the patient as well.  I will continue to follow the patient and reach out to other home health agencies if it appears that the patient has home health care needs  at that time.  Expected Discharge Plan: [place] Barriers to Discharge: Continued Medical Work up   Patient Goals and CMS Choice Patient states their goals for this hospitalization and ongoing recovery are:: Patient plans to discharge home on Monday, 12/11/2019. CMS Medicare.gov Compare Post Acute Care list provided to:: Patient Choice offered to / list presented to : Patient  Expected Discharge Plan and Services Expected Discharge Plan: [place]   Discharge Planning Services: CM Consult Post Acute Care Choice: [place] arrangements for the past 2 months: Apartment                           HH Arranged: RN [place]: [place] ([facility]) Date [place]: 12/08/19 Time [place]: [id number] Representative spoke with at [place]: Ramond Marrow, [facility] - confirmed that patient was currently active with [facility] for RN  Prior Living Arrangements/Services Living arrangements for the past 2 months: [place] with:: Self Patient language and need for interpreter reviewed:: Yes Do you feel safe going back to the place where you live?: Yes      Need for Family Participation in Patient Care: Yes (Comment) Care giver support system in place?: Yes (comment) Current home services: DME, Home RN (Home oxygen and trach/ trach collar supplies) Criminal Activity/Legal Involvement Pertinent to Current Situation/Hospitalization: No - Comment as needed  Activities of Daily Living Home Assistive Devices/Equipment: Cane (specify quad or straight) ADL Screening (condition at time of admission) Patient's cognitive ability adequate to safely complete daily activities?: Yes Is the patient deaf or have difficulty hearing?: No Does the patient have difficulty seeing, even when wearing glasses/contacts?: No Does the patient have difficulty concentrating, remembering, or making decisions?: No Patient able to express  need for assistance with ADLs?: Yes Does the patient have difficulty dressing or bathing?: No Independently performs ADLs?: Yes (appropriate for developmental age) Does the patient have difficulty walking or climbing stairs?: No Weakness of Legs: Both Weakness of Arms/Hands: None  Permission Sought/Granted Permission sought to share information with : Case Manager, PCP Permission granted to share information with : Yes, Verbal Permission Granted     Permission granted to share info w AGENCY: currently active with [facility] per agency  Permission granted to share info w Relationship: family/friend to provide transportation home - otherwise p uberatient states he will need [facility] transportation/     Emotional Assessment Appearance:: Appears stated age Attitude/Demeanor/Rapport: Engaged Affect (typically observed): Accepting Orientation: : Oriented to Self, Oriented to Place, Oriented to  Time, Oriented to Situation Alcohol / Substance Use: Not Applicable Psych Involvement: No (comment)  Admission diagnosis:  Acute deep vein thrombosis (DVT) of left lower extremity (HCC) [I82.402] Deep vein thrombosis (DVT) of proximal vein of left lower extremity, unspecified chronicity (HCC) [I82.4Y2] Patient Active Problem List   Diagnosis Date Noted  . Acute deep vein thrombosis (DVT) of left lower extremity ([place]) 12/04/2019  . Depression with anxiety 12/04/2019  . Pain due to onychomycosis of toenails of both feet 09/27/2019  . Tracheobronchitis 09/26/2019  . UTI (urinary tract infection) 09/26/2019  . Debility 09/15/2019  . Chronic diastolic CHF (congestive heart failure) ([place]) 09/07/2019  . Poorly controlled diabetes mellitus ([place]) 09/06/2019  . Acute airway obstruction   . Endotracheal tube present   . Acute on chronic respiratory failure ([place]) 07/30/2019  . Acute respiratory failure ([place]) 07/30/2019  . Chronic respiratory failure with hypoxia and hypercapnia (HCC)   .  Nodule of soft tissue 01/24/2019  . Benign essential HTN   . Hyperlipidemia   . Tracheostomy dependence ([place])   . Physical deconditioning 07/15/2017  . Chronic respiratory failure with hypoxia ([place]) 07/09/2017  . Acute pulmonary edema (HCC)   . Decubitus ulcer 06/19/2017  . Difficult airway for intubation   . Hypoventilation associated with obesity syndrome ([place]) 06/09/2017  . Seasonal and perennial allergic rhinitis 05/24/2016  . AKI (acute kidney injury) ([place]) 04/08/2015  . Hyperosmolar hyperglycemic state (HHS) ([place]) 03/28/2015  . Type 2 diabetes mellitus with hyperosmolar nonketotic hyperglycemia ([place]) 03/28/2015  . Acute on chronic respiratory failure with hypoxemia ([place]) 03/27/2015  . COPD mixed type ([place]) 12/27/2014  . Severe obesity (BMI >= 40) ([place]) 12/27/2014  . Diabetes mellitus type 2, controlled ([place]) 12/27/2014    Class: Chronic  . OSA (obstructive sleep apnea) 07/29/2013  . Peripheral edema 07/29/2013   PCP:  Vevelyn Francois, NP Pharmacy:   [id number] [place], [place] [address] [place] [address] Phone: [phone] Fax: [phone]  [facility] [place], [place] - [address] [place] [address] Phone: [phone] Fax: [phone]     Social Determinants of Health (SDOH) Interventions    Readmission Risk Interventions Readmission Risk Prevention Plan 12/08/2019 09/15/2019  Transportation Screening  Complete Complete  PCP or Specialist Appt within 3-5 Days Complete -  HRI or Home Care Consult Complete -  Social Work Consult for Recovery Care Planning/Counseling Complete -  Palliative Care Screening Complete -  Medication Review Press photographer) Complete Complete  PCP or Specialist appointment within 3-5 days of discharge - Complete  HRI or [place] - Complete  SW Recovery Care/Counseling Consult -  Complete  [place] - Complete  Some recent data might be hidden

## 2019-12-08 NOTE — Progress Notes (Signed)
Dr. Posey Pronto notified about pt's blood pressure of 97/72, pulse 71. New orders to d/c metoprolol. Will continue to monitor.

## 2019-12-08 NOTE — Progress Notes (Signed)
ANTICOAGULATION CONSULT NOTE  Pharmacy Consult for Heparin Indication: DVT  No Known Allergies  Patient Measurements: Height: 5\' 8"  (172.7 cm) Weight: (!) 151.1 kg (333 lb 3.2 oz) IBW/kg (Calculated) : 68.4 Heparin Dosing Weight: 105.2 kg  Vital Signs: Temp: 98.8 F (37.1 C) (11/19 0500) Temp Source: Oral (11/19 0500) BP: 119/64 (11/19 0500) Pulse Rate: 69 (11/19 0500)  Labs: Recent Labs    12/06/19 0239 12/06/19 0239 12/07/19 0435 12/08/19 0304 12/08/19 0457  HGB 12.0*   < > 11.5* 11.3*  --   HCT 38.3*  --  37.2* 36.8*  --   PLT 169  --  124* 172  --   HEPARINUNFRC 0.56   < > 0.41 <0.10* <0.10*  CREATININE 1.13  --  1.01 1.24  --    < > = values in this interval not displayed.    Estimated Creatinine Clearance: 93.2 mL/min (by C-G formula based on SCr of 1.24 mg/dL).   Assessment: 59 YOM with acute LLE DVT for heparin.  Heparin level subtherapeutic this morning  Goal of Therapy:  Heparin level 0.3-0.7 units/ml Monitor platelets by anticoagulation protocol: Yes   Plan:  Heparin 3000 units IV bolus, then increase heparin 2000 units/hr Check heparin level in 6 hours.   Phillis Knack, PharmD, BCPS    12/08/2019, 6:52 AM

## 2019-12-08 NOTE — Progress Notes (Addendum)
  Progress Note    12/08/2019 7:31 AM * No surgery date entered *  Subjective:  Sleepy, states leg feels the same. Says it is very restless and aching   Vitals:   12/08/19 0353 12/08/19 0500  BP:  119/64  Pulse: 65 69  Resp: 18 17  Temp:  98.8 F (37.1 C)  SpO2: 95% 96%   Physical Exam: Cardiac: regular Lungs:  Not labored Extremities:  LLE edematous. BLE in compression stockings. Both lower extremities well perfused and warm with palpable DP pulses Abdomen: obese, soft Neurologic: alert and oriented  CBC    Component Value Date/Time   WBC 5.8 12/08/2019 0304   RBC 4.27 12/08/2019 0304   HGB 11.3 (L) 12/08/2019 0304   HCT 36.8 (L) 12/08/2019 0304   PLT 172 12/08/2019 0304   MCV 86.2 12/08/2019 0304   MCH 26.5 12/08/2019 0304   MCHC 30.7 12/08/2019 0304   RDW 15.5 12/08/2019 0304   LYMPHSABS 1.9 09/22/2019 1915   MONOABS 0.8 09/22/2019 1915   EOSABS 0.2 09/22/2019 1915   BASOSABS 0.1 09/22/2019 1915    BMET    Component Value Date/Time   NA 139 12/08/2019 0304   NA 144 10/12/2019 0904   K 4.4 12/08/2019 0304   CL 101 12/08/2019 0304   CO2 31 12/08/2019 0304   GLUCOSE 157 (H) 12/08/2019 0304   BUN 17 12/08/2019 0304   BUN 23 10/12/2019 0904   CREATININE 1.24 12/08/2019 0304   CALCIUM 9.1 12/08/2019 0304   GFRNONAA >60 12/08/2019 0304   GFRAA 85 10/12/2019 0904    INR    Component Value Date/Time   INR 1.2 08/30/2019 0258     Intake/Output Summary (Last 24 hours) at 12/08/2019 0731 Last data filed at 12/08/2019 0500 Gross per 24 hour  Intake 1520.96 ml  Output 2130 ml  Net -609.04 ml   Non- invasive imaging studies: IVC/Iliac: There is no evidence of thrombus involving the right common iliac vein. Unable to visualize the left external or internal iliac secondary to bowel gas and body habitus.     Assessment/Plan:  59 y.o. male with extensive left lower extremity DVT. No real improvement in LLE edema or pain. Iliac duplex does not show  proximal thrombus however limited study so unable to determine definitively if thrombus present. Remains on Heparin IV Continue to encourage Left lower extremity elevation He is using compression stockings BLE Tentatively scheduled to have mechanical thrombectomy of LLE on Monday 12/11/19  DVT prophylaxis:  Heparin gtt   Karoline Caldwell, PA-C [facility] [phone] 12/08/2019 7:31 AM   I agree with the above.  I have seen and evaluated the patient.  He remains on heparin and has a compression stocking on for his left leg DVT.  He has no signs of arterial compromise.  He should remain on anticoagulation over the weekend.  I discussed proceeding with mechanical thrombectomy of the left leg venous system on Monday.  He tells me that he is able to lay on his stomach for the procedure.  We discussed the details of the procedure and expected outcomes.  All questions were answered.  He will need to be n.p.o. after midnight on Sunday.  Annamarie Major

## 2019-12-09 DIAGNOSIS — I824Z2 Acute embolism and thrombosis of unspecified deep veins of left distal lower extremity: Secondary | ICD-10-CM | POA: Diagnosis not present

## 2019-12-09 LAB — BASIC METABOLIC PANEL
Anion gap: 9 (ref 5–15)
BUN: 16 mg/dL (ref 6–20)
CO2: 28 mmol/L (ref 22–32)
Calcium: 9.1 mg/dL (ref 8.9–10.3)
Chloride: 100 mmol/L (ref 98–111)
Creatinine, Ser: 1.12 mg/dL (ref 0.61–1.24)
GFR, Estimated: >60 mL/min (ref >60)
Glucose, Bld: 157 mg/dL — ABNORMAL HIGH (ref 70–99)
Potassium: 3.9 mmol/L (ref 3.5–5.1)
Sodium: 137 mmol/L (ref 135–145)

## 2019-12-09 LAB — GLUCOSE, CAPILLARY
Glucose-Capillary: 140 mg/dL — ABNORMAL HIGH (ref 70–99)
Glucose-Capillary: 140 mg/dL — ABNORMAL HIGH (ref 70–99)
Glucose-Capillary: 129 mg/dL — ABNORMAL HIGH (ref 70–99)
Glucose-Capillary: 144 mg/dL — ABNORMAL HIGH (ref 70–99)

## 2019-12-09 LAB — HEPARIN LEVEL (UNFRACTIONATED)
Heparin Unfractionated: 0.14 IU/mL — ABNORMAL LOW (ref 0.30–0.70)
Heparin Unfractionated: 0.41 IU/mL (ref 0.30–0.70)
Heparin Unfractionated: 0.19 IU/mL — ABNORMAL LOW (ref 0.30–0.70)

## 2019-12-09 LAB — CBC
HCT: 36.3 % — ABNORMAL LOW (ref 39.0–52.0)
Hemoglobin: 11.2 g/dL — ABNORMAL LOW (ref 13.0–17.0)
MCH: 26.4 pg (ref 26.0–34.0)
MCHC: 30.9 g/dL (ref 30.0–36.0)
MCV: 85.6 fL (ref 80.0–100.0)
Platelets: 197 10*3/uL (ref 150–400)
RBC: 4.24 MIL/uL (ref 4.22–5.81)
RDW: 15.7 % — ABNORMAL HIGH (ref 11.5–15.5)
WBC: 6 10*3/uL (ref 4.0–10.5)
nRBC: 0 % (ref 0.0–0.2)

## 2019-12-09 MED ORDER — TORSEMIDE 20 MG PO TABS
10.0000 mg | ORAL_TABLET | Freq: Every day | ORAL | Status: DC
  Administered 2019-12-09 – 2019-12-13 (×4): 10 mg via ORAL
  Filled 2019-12-09 (×5): qty 1

## 2019-12-09 NOTE — Progress Notes (Signed)
ANTICOAGULATION CONSULT NOTE  Pharmacy Consult for Heparin Indication: DVT  No Known Allergies  Patient Measurements: Height: 5\' 8"  (867.7 cm) Weight: (!) 151.1 kg (333 lb 3.2 oz) IBW/kg (Calculated) : 68.4 Heparin Dosing Weight: 105.2 kg  Vital Signs: Temp: 98.3 F (36.8 C) (11/19 1935) Temp Source: Oral (11/19 1935) BP: 148/87 (11/19 1935) Pulse Rate: 74 (11/19 2349)  Labs: Recent Labs    12/07/19 0435 12/07/19 0435 12/08/19 0304 12/08/19 0304 12/08/19 0457 12/08/19 1504 12/09/19 0137  HGB 11.5*   < > 11.3*  --   --   --  11.2*  HCT 37.2*  --  36.8*  --   --   --  36.3*  PLT 124*  --  172  --   --   --  197  HEPARINUNFRC 0.41   < > <0.10*   < > <0.10* 0.44 0.14*  CREATININE 1.01  --  1.24  --   --   --  1.12   < > = values in this interval not displayed.    Estimated Creatinine Clearance: 103.2 mL/min (by C-G formula based on SCr of 1.12 mg/dL).   Assessment: 46 YOM with acute LLE DVT for heparin.   Heparin level therapeutic now   11/20 AM update:  Heparin level low No issues per RN  Goal of Therapy:  Heparin level 0.3-0.7 units/ml Monitor platelets by anticoagulation protocol: Yes   Plan:  Inc heparin to 2300 units/hr 1130 heparin level  Narda Bonds, PharmD, BCPS Clinical Pharmacist Phone: [phone]

## 2019-12-09 NOTE — Progress Notes (Signed)
ANTICOAGULATION CONSULT NOTE  Pharmacy Consult for Heparin Indication: DVT  No Known Allergies  Patient Measurements: Height: 5\' 8"  (172.7 cm) Weight: (!) 151.1 kg (333 lb 3.2 oz) IBW/kg (Calculated) : 68.4 Heparin Dosing Weight: 105.2 kg  Vital Signs: Temp: 98.4 F (36.9 C) (11/20 1018) Temp Source: Oral (11/20 1018) BP: 139/69 (11/20 1018) Pulse Rate: 73 (11/20 1018)  Labs: Recent Labs    12/07/19 0435 12/07/19 0435 12/08/19 0304 12/08/19 0457 12/09/19 0137 12/09/19 1127 12/09/19 1808  HGB 11.5*   < > 11.3*  --  11.2*  --   --   HCT 37.2*  --  36.8*  --  36.3*  --   --   PLT 124*  --  172  --  197  --   --   HEPARINUNFRC 0.41   < > <0.10*   < > 0.14* 0.41 0.19*  CREATININE 1.01  --  1.24  --  1.12  --   --    < > = values in this interval not displayed.    Estimated Creatinine Clearance: 103.2 mL/min (by C-G formula based on SCr of 1.12 mg/dL).   Assessment: 39 YOM admitted with LLE DVT. Mechanical thrombectomy planned for Monday 12/11/19. Pharmacy consulted to dose heparin.  Heparin level now subtherapeutic at 0.19, no infusion issues documented.  Goal of Therapy:  Heparin level 0.3-0.7 units/ml Monitor platelets by anticoagulation protocol: Yes   Plan:  Increase heparin to 2500 units/h Recheck heparin level with morning labs   Arrie Senate, PharmD, BCPS Clinical Pharmacist [phone] Please check AMION for all [place] numbers 12/09/2019

## 2019-12-09 NOTE — Plan of Care (Signed)

## 2019-12-09 NOTE — Progress Notes (Signed)
[facility] Progress Note  Patient: Timothy Le    PPJ:[id number]  [place]: 12/04/2019     Date of Service: the patient was seen and examined on 12/09/2019  Brief hospital course: 59 year old male with PMH of morbid obesity (BMI 51), OSA, COPD, chronic diastolic heart failure, and chronic respiratory failure with tracheostomy in place who presented to the ED on 11/15 with left leg swelling found to have an extensive LEFT lower extremity DVT involving the left common femoral vein, SF junction, as well as left proximal profunda, popliteal, and posterior tibial veins.  CTA negative for PE. Currently plan is continue IV heparin and monitor for improvement.  Assessment and Plan: Acute left lower extremity DVT. Doppler shows evidence of DVT extending from common femoral vein all the way to the distal vein system. CT PE negative for pulmonary embolism. Pain still unchanged. Significant edema as well. Vascular surgery was consulted.  Appreciate assistance. Continue IV heparin. Temporarily scheduled for intervention on Monday. Oxycodone PRN for pain.  Chronic Hypoxic Respiratory Failure due to OSA/OHS and COPD:  Tracheostomy status. He wears BiPAP nightly. Follows up with pulmonary outpatient. Currently respiratory status stable. Monitor.  Chronic Diastolic Heart Failure:  Euvolemic.    Resuming diuretic.  Monitor.  Insulin Dependent Diabetes:  Continue Levemir 17 units nightly and SSi with POC glucose q6hrs.  Depression/Anxiety:  Ok to continue home psych meds Seroquel, Zoloft, and Trazodone  Essential Hypertension:  BP on the softer side. Hold Lopressor.  Hold home meds.  Morbid obesity Placing the patient high risk for poor outcome. Also at risk for recurrent DVTs and does make me suspect that he may require long-term anticoagulation instead of 3 months. Also limiting options given his high BMI for anticoagulation. Body mass index is 50.66 kg/m.   Diet: Cardiac  and carb  Advance goals of care discussion: Full code  Family Communication: no family was present at bedside, at the time of interview.   Disposition:  Status is: Inpatient  Remains inpatient appropriate because:IV treatments appropriate due to intensity of illness or inability to take PO  Dispo: The patient is from: Home              Anticipated d/c is to: Home              Anticipated d/c date is: 3 days              Patient currently is not medically stable to d/c.  Subjective: More swelling today.  No nausea no vomiting no fever no chills.  Physical Exam:  General: Appear in mild distress, no Rash; Oral Mucosa Clear, moist. no Abnormal Neck Mass Or lumps, Conjunctiva normal  Cardiovascular: S1 and S2 Present, no Murmur, Respiratory: good respiratory effort, Bilateral Air entry present and CTA, no Crackles, no wheezes Abdomen: Bowel Sound present, Soft and no tenderness Extremities: bilateral  Pedal edema Neurology: alert and oriented to time, place, and person affect appropriate. no new focal deficit Gait not checked due to patient safety concerns   Vitals:   12/09/19 0921 12/09/19 1018 12/09/19 1245 12/09/19 1542  BP:  139/69    Pulse:  73    Resp:  18    Temp:  98.4 F (36.9 C)    TempSrc:  Oral    SpO2: 96% 96% 96% 95%  Weight:      Height:        Intake/Output Summary (Last 24 hours) at 12/09/2019 1810 Last data filed at 12/09/2019 5809  Gross per 24 hour  Intake 440 ml  Output 350 ml  Net 90 ml   Filed Weights   12/04/19 2000  Weight: (!) 151.1 kg    Data Reviewed: I have personally reviewed and interpreted daily labs, tele strips, imagings as discussed above. I reviewed all nursing notes, pharmacy notes, vitals, pertinent old records I have discussed plan of care as described above with RN and patient/family.  CBC: Recent Labs  Lab 12/05/19 0523 12/06/19 0239 12/07/19 0435 12/08/19 0304 12/09/19 0137  WBC 8.7 7.3 5.7 5.8 6.0  HGB 12.4*  12.0* 11.5* 11.3* 11.2*  HCT 40.3 38.3* 37.2* 36.8* 36.3*  MCV 86.7 85.1 86.1 86.2 85.6  PLT 164 169 124* 172 427   Basic Metabolic Panel: Recent Labs  Lab 12/05/19 0523 12/06/19 0239 12/07/19 0435 12/08/19 0304 12/09/19 0137  NA 138 136 136 139 137  K 3.6 3.8 4.0 4.4 3.9  CL 99 98 99 101 100  CO2 25 27 26 31 28   GLUCOSE 154* 164* 152* 157* 157*  BUN 17 17 16 17 16   CREATININE 1.07 1.13 1.01 1.24 1.12  CALCIUM 9.1 8.7* 8.7* 9.1 9.1    Studies: No results found.  Scheduled Meds: . allopurinol  100 mg Oral Daily  . atorvastatin  10 mg Oral Daily  . diclofenac Sodium  2 g Topical QID  . gabapentin  200 mg Oral TID  . insulin aspart  0-5 Units Subcutaneous QHS  . insulin aspart  0-9 Units Subcutaneous TID WC  . insulin detemir  17 Units Subcutaneous BID  . mometasone-formoterol  2 puff Inhalation BID  . pantoprazole  40 mg Oral QHS  . QUEtiapine  100 mg Oral BID  . sertraline  100 mg Oral Daily  . torsemide  10 mg Oral Daily   Continuous Infusions: . sodium chloride 10 mL/hr at 12/07/19 1300  . heparin 2,300 Units/hr (12/09/19 1707)   PRN Meds: acetaminophen **OR** acetaminophen, bisacodyl, ipratropium-albuterol, ondansetron **OR** ondansetron (ZOFRAN) IV, oxyCODONE, polyethylene glycol, traZODone  Time spent: 35 minutes  Author: Berle Mull, MD [facility] 12/09/2019 6:10 PM  To reach On-call, see care teams to locate the attending and reach out via [email or web address]. Between 7PM-7AM, please contact night-coverage If you still have difficulty reaching the attending provider, please page the [facility] (Director on Call) for [facility] on amion for assistance.

## 2019-12-09 NOTE — Plan of Care (Signed)

## 2019-12-09 NOTE — Progress Notes (Signed)
ANTICOAGULATION CONSULT NOTE  Pharmacy Consult for Heparin Indication: DVT  No Known Allergies  Patient Measurements: Height: 5\' 8"  (172.7 cm) Weight: (!) 151.1 kg (333 lb 3.2 oz) IBW/kg (Calculated) : 68.4 Heparin Dosing Weight: 105.2 kg  Vital Signs: Temp: 98.4 F (36.9 C) (11/20 1018) Temp Source: Oral (11/20 1018) BP: 139/69 (11/20 1018) Pulse Rate: 73 (11/20 1018)  Labs: Recent Labs    12/07/19 0435 12/07/19 0435 12/08/19 0304 12/08/19 0457 12/08/19 1504 12/09/19 0137 12/09/19 1127  HGB 11.5*   < > 11.3*  --   --  11.2*  --   HCT 37.2*  --  36.8*  --   --  36.3*  --   PLT 124*  --  172  --   --  197  --   HEPARINUNFRC 0.41   < > <0.10*   < > 0.44 0.14* 0.41  CREATININE 1.01  --  1.24  --   --  1.12  --    < > = values in this interval not displayed.    Estimated Creatinine Clearance: 103.2 mL/min (by C-G formula based on SCr of 1.12 mg/dL).   Assessment: 70 YOM admitted with LLE DVT. Mechanical thrombectomy planned for Monday 12/11/19. Pharmacy consulted to dose heparin.  Heparin level therapeutic at 0.41 after rate increase to 2300 units/hr. CBC stable, renal function stable.   Goal of Therapy:  Heparin level 0.3-0.7 units/ml Monitor platelets by anticoagulation protocol: Yes   Plan:  Continue heparin infusion at 2300 units/hr Confirmatory heparin level in 6 hours Monitor daily heparin level, CBC, and s/sx of bleeding  Berenice Bouton, PharmD PGY2 Pharmacy Resident Phone between 7 am - 3:30 pm: [phone]  Please check AMION for all [place] phone numbers After 10:00 PM, call [place] [phone]  12/09/2019 12:18 PM

## 2019-12-10 DIAGNOSIS — I824Z2 Acute embolism and thrombosis of unspecified deep veins of left distal lower extremity: Secondary | ICD-10-CM | POA: Diagnosis not present

## 2019-12-10 LAB — HEPARIN LEVEL (UNFRACTIONATED)
Heparin Unfractionated: 0.47 IU/mL (ref 0.30–0.70)
Heparin Unfractionated: 0.67 IU/mL (ref 0.30–0.70)

## 2019-12-10 LAB — GLUCOSE, CAPILLARY
Glucose-Capillary: 124 mg/dL — ABNORMAL HIGH (ref 70–99)
Glucose-Capillary: 153 mg/dL — ABNORMAL HIGH (ref 70–99)
Glucose-Capillary: 140 mg/dL — ABNORMAL HIGH (ref 70–99)
Glucose-Capillary: 143 mg/dL — ABNORMAL HIGH (ref 70–99)

## 2019-12-10 LAB — CBC
HCT: 37.1 % — ABNORMAL LOW (ref 39.0–52.0)
Hemoglobin: 11.7 g/dL — ABNORMAL LOW (ref 13.0–17.0)
MCH: 27.1 pg (ref 26.0–34.0)
MCHC: 31.5 g/dL (ref 30.0–36.0)
MCV: 86.1 fL (ref 80.0–100.0)
Platelets: 195 10*3/uL (ref 150–400)
RBC: 4.31 MIL/uL (ref 4.22–5.81)
RDW: 15.7 % — ABNORMAL HIGH (ref 11.5–15.5)
WBC: 5.9 10*3/uL (ref 4.0–10.5)
nRBC: 0 % (ref 0.0–0.2)

## 2019-12-10 LAB — BASIC METABOLIC PANEL
Anion gap: 12 (ref 5–15)
BUN: 18 mg/dL (ref 6–20)
CO2: 24 mmol/L (ref 22–32)
Calcium: 9.2 mg/dL (ref 8.9–10.3)
Chloride: 103 mmol/L (ref 98–111)
Creatinine, Ser: 1.17 mg/dL (ref 0.61–1.24)
GFR, Estimated: >60 mL/min (ref >60)
Glucose, Bld: 121 mg/dL — ABNORMAL HIGH (ref 70–99)
Potassium: 4.1 mmol/L (ref 3.5–5.1)
Sodium: 139 mmol/L (ref 135–145)

## 2019-12-10 NOTE — Plan of Care (Signed)

## 2019-12-10 NOTE — Plan of Care (Signed)

## 2019-12-10 NOTE — Progress Notes (Signed)
[facility] Progress Note  Patient: Timothy Le    LZJ:[id number]  [place]: 12/04/2019     Date of Service: the patient was seen and examined on 12/10/2019  Brief hospital course: 59 year old male with PMH of morbid obesity (BMI 51), OSA, COPD, chronic diastolic heart failure, and chronic respiratory failure with tracheostomy in place who presented to the ED on 11/15 with left leg swelling found to have an extensive LEFT lower extremity DVT involving the left common femoral vein, SF junction, as well as left proximal profunda, popliteal, and posterior tibial veins.  CTA negative for PE. Currently plan is continue IV heparin and monitor for improvement.  Assessment and Plan: Acute left lower extremity DVT. Doppler shows evidence of DVT extending from common femoral vein all the way to the distal vein system. CT PE negative for pulmonary embolism. Pain still unchanged. Significant edema as well. Vascular surgery was consulted.  Appreciate assistance. Continue IV heparin. Mechanical thrombectomy scheduled for tomorrow.  Chronic Hypoxic Respiratory Failure due to OSA/OHS and COPD:  Tracheostomy status. He wears BiPAP nightly. Follows up with pulmonary outpatient. Currently respiratory status stable. Monitor.  Chronic Diastolic Heart Failure:  Euvolemic.    Resuming diuretic.  Monitor. Cough and breathing as well as edema all improving after initiation of diuretic again.  Insulin Dependent Diabetes:  Continue Levemir 17 units nightly and SSi with POC glucose q6hrs.  Depression/Anxiety:  Ok to continue home psych meds Seroquel, Zoloft, and Trazodone  Essential Hypertension:  BP on the softer side. Hold Lopressor.  Hold home meds.  Morbid obesity Placing the patient high risk for poor outcome. Also at risk for recurrent DVTs and does make me suspect that he may require long-term anticoagulation instead of 3 months. Also limiting options given his high BMI for  anticoagulation. Body mass index is 50.66 kg/m.   Diet: Cardiac and carb  Advance goals of care discussion: Full code  Family Communication: no family was present at bedside, at the time of interview.   Disposition:  Status is: Inpatient  Remains inpatient appropriate because:IV treatments appropriate due to intensity of illness or inability to take PO  Dispo: The patient is from: Home              Anticipated d/c is to: Home              Anticipated d/c date is: 3 days              Patient currently is not medically stable to d/c.  Subjective: Swelling improving.  No nausea no vomiting.  Cough better as well.  No blood in the stool.  Physical Exam:  General: Appear in mild distress, no Rash; Oral Mucosa Clear, moist. no Abnormal Neck Mass Or lumps, Conjunctiva normal  Cardiovascular: S1 and S2 Present, no Murmur, Respiratory: good respiratory effort, Bilateral Air entry present and CTA, no Crackles, no wheezes Abdomen: Bowel Sound present, Soft and no tenderness Extremities: bilateral  Pedal edema Neurology: alert and oriented to time, place, and person affect appropriate. no new focal deficit Gait not checked due to patient safety concerns   Vitals:   12/10/19 0847 12/10/19 1239 12/10/19 1451 12/10/19 1502  BP:    118/64  Pulse:    76  Resp:    16  Temp:    98.4 F (36.9 C)  TempSrc:    Oral  SpO2: 98% 98% 99% 93%  Weight:      Height:  Intake/Output Summary (Last 24 hours) at 12/10/2019 2024 Last data filed at 12/10/2019 1722 Gross per 24 hour  Intake 2301.18 ml  Output 2940 ml  Net -638.82 ml   Filed Weights   12/04/19 2000  Weight: (!) 151.1 kg    Data Reviewed: I have personally reviewed and interpreted daily labs, tele strips, imagings as discussed above. I reviewed all nursing notes, pharmacy notes, vitals, pertinent old records I have discussed plan of care as described above with RN and patient/family.  CBC: Recent Labs  Lab  12/06/19 0239 12/07/19 0435 12/08/19 0304 12/09/19 0137 12/10/19 0604  WBC 7.3 5.7 5.8 6.0 5.9  HGB 12.0* 11.5* 11.3* 11.2* 11.7*  HCT 38.3* 37.2* 36.8* 36.3* 37.1*  MCV 85.1 86.1 86.2 85.6 86.1  PLT 169 124* 172 197 818   Basic Metabolic Panel: Recent Labs  Lab 12/06/19 0239 12/07/19 0435 12/08/19 0304 12/09/19 0137 12/10/19 0604  NA 136 136 139 137 139  K 3.8 4.0 4.4 3.9 4.1  CL 98 99 101 100 103  CO2 27 26 31 28 24   GLUCOSE 164* 152* 157* 157* 121*  BUN 17 16 17 16 18   CREATININE 1.13 1.01 1.24 1.12 1.17  CALCIUM 8.7* 8.7* 9.1 9.1 9.2    Studies: No results found.  Scheduled Meds:  allopurinol  100 mg Oral Daily   atorvastatin  10 mg Oral Daily   diclofenac Sodium  2 g Topical QID   gabapentin  200 mg Oral TID   insulin aspart  0-5 Units Subcutaneous QHS   insulin aspart  0-9 Units Subcutaneous TID WC   insulin detemir  17 Units Subcutaneous BID   mometasone-formoterol  2 puff Inhalation BID   pantoprazole  40 mg Oral QHS   QUEtiapine  100 mg Oral BID   sertraline  100 mg Oral Daily   torsemide  10 mg Oral Daily   Continuous Infusions:  sodium chloride Stopped (12/09/19 2103)   heparin 2,400 Units/hr (12/10/19 1551)   PRN Meds: acetaminophen **OR** acetaminophen, bisacodyl, ipratropium-albuterol, ondansetron **OR** ondansetron (ZOFRAN) IV, oxyCODONE, polyethylene glycol, traZODone  Time spent: 35 minutes  Author: Berle Mull, MD [facility] 12/10/2019 8:24 PM  To reach On-call, see care teams to locate the attending and reach out via [email or web address]. Between 7PM-7AM, please contact night-coverage If you still have difficulty reaching the attending provider, please page the [facility] (Director on Call) for [facility] on amion for assistance.

## 2019-12-10 NOTE — Progress Notes (Signed)
   VASCULAR SURGERY ASSESSMENT & PLAN:   He is scheduled for mechanical thrombectomy of his left lower extremity DVT tomorrow with Dr. Donzetta Matters.  He has no questions about the procedure today.  I have written preop orders.  SUBJECTIVE:   No complaints.  PHYSICAL EXAM:   Vitals:   12/10/19 0138 12/10/19 0419 12/10/19 0845 12/10/19 0847  BP:  137/85    Pulse:  80    Resp: 16 17    Temp:  98.7 F (37.1 C)    TempSrc:  Oral    SpO2:  98% 98% 98%  Weight:      Height:       He feels like the left lower extremity swelling has improved.  LABS:   Lab Results  Component Value Date   WBC 5.9 12/10/2019   HGB 11.7 (L) 12/10/2019   HCT 37.1 (L) 12/10/2019   MCV 86.1 12/10/2019   PLT 195 12/10/2019   Lab Results  Component Value Date   CREATININE 1.17 12/10/2019   Lab Results  Component Value Date   INR 1.2 08/30/2019   CBG (last 3)  Recent Labs    12/09/19 1649 12/09/19 2020 12/10/19 0649  GLUCAP 129* 144* 124*    PROBLEM LIST:    Principal Problem:   Acute deep vein thrombosis (DVT) of left lower extremity (HCC) Active Problems:   OSA (obstructive sleep apnea)   COPD mixed type (HCC)   Diabetes mellitus type 2, controlled (HCC)   Hypoventilation associated with obesity syndrome (HCC)   Benign essential HTN   Chronic respiratory failure with hypoxia and hypercapnia (HCC)   Chronic diastolic CHF (congestive heart failure) (HCC)   Depression with anxiety   CURRENT MEDS:   . allopurinol  100 mg Oral Daily  . atorvastatin  10 mg Oral Daily  . diclofenac Sodium  2 g Topical QID  . gabapentin  200 mg Oral TID  . insulin aspart  0-5 Units Subcutaneous QHS  . insulin aspart  0-9 Units Subcutaneous TID WC  . insulin detemir  17 Units Subcutaneous BID  . mometasone-formoterol  2 puff Inhalation BID  . pantoprazole  40 mg Oral QHS  . QUEtiapine  100 mg Oral BID  . sertraline  100 mg Oral Daily  . torsemide  10 mg Oral Daily    Deitra Mayo Office:  [phone] 12/10/2019

## 2019-12-10 NOTE — Progress Notes (Addendum)
ANTICOAGULATION CONSULT NOTE  Pharmacy Consult for Heparin Indication: DVT  No Known Allergies  Patient Measurements: Height: 5\' 8"  (172.7 cm) Weight: (!) 151.1 kg (333 lb 3.2 oz) IBW/kg (Calculated) : 68.4 Heparin Dosing Weight: 105.2 kg  Vital Signs: Temp: 98.7 F (37.1 C) (11/21 0419) Temp Source: Oral (11/21 0419) BP: 137/85 (11/21 0419) Pulse Rate: 80 (11/21 0419)  Labs: Recent Labs    12/08/19 0304 12/08/19 0457 12/09/19 0137 12/09/19 0137 12/09/19 1127 12/09/19 1808 12/10/19 0604  HGB 11.3*   < > 11.2*  --   --   --  11.7*  HCT 36.8*  --  36.3*  --   --   --  37.1*  PLT 172  --  197  --   --   --  195  HEPARINUNFRC <0.10*   < > 0.14*   < > 0.41 0.19* 0.47  CREATININE 1.24  --  1.12  --   --   --  1.17   < > = values in this interval not displayed.    Estimated Creatinine Clearance: 98.8 mL/min (by C-G formula based on SCr of 1.17 mg/dL).   Assessment: 10 YOM admitted with LLE DVT. Mechanical thrombectomy planned for Monday 12/11/19. Pharmacy consulted to dose heparin.  Heparin level therapeutic at 0.47 after rate increase to 2500 units/hr. CBC stable. No infusion or bleeding issues noted by RN.  Goal of Therapy:  Heparin level 0.3-0.7 units/ml Monitor platelets by anticoagulation protocol: Yes   Plan:  Continue heparin infusion at 2500 units/h Confirmatory heparin level in 6 hours Daily heparin level, CBC, and s/sx of bleeding  Addendum: Confirmatory heparin level is therapeutic at 0.67, higher end of goal.  Will reduce rate to 2400 units/hr and check daily heparin levels.  Berenice Bouton, PharmD PGY2 Pharmacy Resident Phone between 7 am - 3:30 pm: [phone]  Please check AMION for all [place] phone numbers After 10:00 PM, call [place] [phone]  12/10/2019

## 2019-12-10 NOTE — Plan of Care (Signed)

## 2019-12-11 ENCOUNTER — Inpatient Hospital Stay (HOSPITAL_COMMUNITY)
Admission: EM | Disposition: A | Discharge status: Home / Self Care | Payer: Self-pay | Source: Home / Self Care | Attending: Internal Medicine

## 2019-12-11 ENCOUNTER — Encounter (HOSPITAL_COMMUNITY): Payer: Self-pay | Admitting: Vascular Surgery

## 2019-12-11 DIAGNOSIS — I824Z2 Acute embolism and thrombosis of unspecified deep veins of left distal lower extremity: Secondary | ICD-10-CM | POA: Diagnosis not present

## 2019-12-11 HISTORY — PX: CORONARY ULTRASOUND/IVUS: CATH118244

## 2019-12-11 HISTORY — PX: PERIPHERAL VASCULAR THROMBECTOMY: CATH118306

## 2019-12-11 LAB — GLUCOSE, CAPILLARY
Glucose-Capillary: 132 mg/dL — ABNORMAL HIGH (ref 70–99)
Glucose-Capillary: 125 mg/dL — ABNORMAL HIGH (ref 70–99)
Glucose-Capillary: 127 mg/dL — ABNORMAL HIGH (ref 70–99)
Glucose-Capillary: 138 mg/dL — ABNORMAL HIGH (ref 70–99)

## 2019-12-11 LAB — HEPARIN LEVEL (UNFRACTIONATED)
Heparin Unfractionated: 0.31 IU/mL (ref 0.30–0.70)
Heparin Unfractionated: 0.6 IU/mL (ref 0.30–0.70)

## 2019-12-11 LAB — BASIC METABOLIC PANEL
Anion gap: 9 (ref 5–15)
BUN: 20 mg/dL (ref 6–20)
CO2: 29 mmol/L (ref 22–32)
Calcium: 9.4 mg/dL (ref 8.9–10.3)
Chloride: 100 mmol/L (ref 98–111)
Creatinine, Ser: 1.2 mg/dL (ref 0.61–1.24)
GFR, Estimated: >60 mL/min (ref >60)
Glucose, Bld: 133 mg/dL — ABNORMAL HIGH (ref 70–99)
Potassium: 3.8 mmol/L (ref 3.5–5.1)
Sodium: 138 mmol/L (ref 135–145)

## 2019-12-11 LAB — CBC
HCT: 37.8 % — ABNORMAL LOW (ref 39.0–52.0)
Hemoglobin: 11.8 g/dL — ABNORMAL LOW (ref 13.0–17.0)
MCH: 27 pg (ref 26.0–34.0)
MCHC: 31.2 g/dL (ref 30.0–36.0)
MCV: 86.5 fL (ref 80.0–100.0)
Platelets: 245 10*3/uL (ref 150–400)
RBC: 4.37 MIL/uL (ref 4.22–5.81)
RDW: 15.8 % — ABNORMAL HIGH (ref 11.5–15.5)
WBC: 5.6 10*3/uL (ref 4.0–10.5)
nRBC: 0 % (ref 0.0–0.2)

## 2019-12-11 SURGERY — PERIPHERAL VASCULAR THROMBECTOMY
Anesthesia: LOCAL | Laterality: Left

## 2019-12-11 MED ORDER — FENTANYL CITRATE (PF) 100 MCG/2ML IJ SOLN
INTRAMUSCULAR | Status: DC | PRN
  Administered 2019-12-11 (×2): 50 ug via INTRAVENOUS

## 2019-12-11 MED ORDER — LIDOCAINE HCL (PF) 1 % IJ SOLN
INTRAMUSCULAR | Status: AC
  Filled 2019-12-11: qty 30

## 2019-12-11 MED ORDER — IODIXANOL 320 MG/ML IV SOLN
INTRAVENOUS | Status: DC | PRN
  Administered 2019-12-11: 25 mL via INTRAVENOUS

## 2019-12-11 MED ORDER — ACETAMINOPHEN 325 MG PO TABS
650.0000 mg | ORAL_TABLET | ORAL | Status: DC | PRN
  Administered 2019-12-11 – 2019-12-12 (×2): 650 mg via ORAL
  Filled 2019-12-11 (×2): qty 2

## 2019-12-11 MED ORDER — HYDRALAZINE HCL 20 MG/ML IJ SOLN: 5.0000 mg | INTRAMUSCULAR | Status: DC | PRN

## 2019-12-11 MED ORDER — SODIUM CHLORIDE 0.9% FLUSH
3.0000 mL | Freq: Two times a day (BID) | INTRAVENOUS | Status: DC
  Administered 2019-12-12 – 2019-12-13 (×3): 3 mL via INTRAVENOUS

## 2019-12-11 MED ORDER — HEPARIN SODIUM (PORCINE) 1000 UNIT/ML IJ SOLN
INTRAMUSCULAR | Status: AC
  Filled 2019-12-11: qty 2

## 2019-12-11 MED ORDER — MIDAZOLAM HCL 2 MG/2ML IJ SOLN
INTRAMUSCULAR | Status: AC
  Filled 2019-12-11: qty 2

## 2019-12-11 MED ORDER — HYDROMORPHONE HCL 1 MG/ML IJ SOLN
1.0000 mg | Freq: Once | INTRAMUSCULAR | Status: AC
  Administered 2019-12-11: 1 mg via INTRAVENOUS

## 2019-12-11 MED ORDER — SODIUM CHLORIDE 0.9 % IV SOLN: 250.0000 mL | INTRAVENOUS | Status: DC | PRN

## 2019-12-11 MED ORDER — SODIUM CHLORIDE 0.9 % IV SOLN: INTRAVENOUS | Status: DC

## 2019-12-11 MED ORDER — LIDOCAINE HCL (PF) 1 % IJ SOLN
INTRAMUSCULAR | Status: DC | PRN
  Administered 2019-12-11: 30 mL via INTRADERMAL

## 2019-12-11 MED ORDER — FENTANYL CITRATE (PF) 100 MCG/2ML IJ SOLN
INTRAMUSCULAR | Status: AC
  Filled 2019-12-11: qty 2

## 2019-12-11 MED ORDER — ASPIRIN EC 81 MG PO TBEC
81.0000 mg | DELAYED_RELEASE_TABLET | Freq: Every day | ORAL | Status: DC
  Administered 2019-12-11 – 2019-12-13 (×3): 81 mg via ORAL
  Filled 2019-12-11 (×3): qty 1

## 2019-12-11 MED ORDER — SODIUM CHLORIDE 0.9 % IV SOLN: INTRAVENOUS | Status: AC

## 2019-12-11 MED ORDER — HEPARIN (PORCINE) 25000 UT/250ML-% IV SOLN
2250.0000 [IU]/h | INTRAVENOUS | Status: DC
  Administered 2019-12-11 – 2019-12-12 (×2): 2400 [IU]/h via INTRAVENOUS
  Filled 2019-12-11 (×2): qty 250

## 2019-12-11 MED ORDER — HEPARIN SODIUM (PORCINE) 1000 UNIT/ML IJ SOLN
INTRAMUSCULAR | Status: DC | PRN
  Administered 2019-12-11: 15000 [IU] via INTRAVENOUS
  Administered 2019-12-11: 5000 [IU] via INTRAVENOUS

## 2019-12-11 MED ORDER — HEPARIN (PORCINE) IN NACL 1000-0.9 UT/500ML-% IV SOLN
INTRAVENOUS | Status: AC
  Filled 2019-12-11: qty 500

## 2019-12-11 MED ORDER — ONDANSETRON HCL 4 MG/2ML IJ SOLN: 4.0000 mg | Freq: Four times a day (QID) | INTRAMUSCULAR | Status: DC | PRN

## 2019-12-11 MED ORDER — LABETALOL HCL 5 MG/ML IV SOLN: 10.0000 mg | INTRAVENOUS | Status: DC | PRN

## 2019-12-11 MED ORDER — MIDAZOLAM HCL 2 MG/2ML IJ SOLN
INTRAMUSCULAR | Status: DC | PRN
  Administered 2019-12-11: 2 mg via INTRAVENOUS

## 2019-12-11 MED ORDER — HYDROMORPHONE HCL 1 MG/ML IJ SOLN
INTRAMUSCULAR | Status: AC
  Filled 2019-12-11: qty 1

## 2019-12-11 MED ORDER — SODIUM CHLORIDE 0.9% FLUSH: 3.0000 mL | INTRAVENOUS | Status: DC | PRN

## 2019-12-11 SURGICAL SUPPLY — 20 items
BAG SNAP BAND KOVER 36X36 (MISCELLANEOUS) ×1 IMPLANT
BALLN ATLAS 14X40X75 (BALLOONS) ×2
BALLN MUSTANG 8X80X75 (BALLOONS) ×2
BALLOON ATLAS 14X40X75 (BALLOONS) IMPLANT
BALLOON MUSTANG 8X80X75 (BALLOONS) IMPLANT
CATH ANGIO 5F BER 100CM (CATHETERS) ×1 IMPLANT
CATH RETRIEVER CLOT 16MMX105CM (CATHETERS) ×1 IMPLANT
CATH VISIONS PV .035 IVUS (CATHETERS) ×1 IMPLANT
COVER DOME SNAP 22 D (MISCELLANEOUS) ×1 IMPLANT
GLIDEWIRE ADV .035X260CM (WIRE) ×1 IMPLANT
KIT ENCORE 26 ADVANTAGE (KITS) ×1 IMPLANT
KIT MICROPUNCTURE NIT STIFF (SHEATH) ×1 IMPLANT
PROTECTION STATION PRESSURIZED (MISCELLANEOUS) ×2
SHEATH CLOT RETRIEVER (SHEATH) ×1 IMPLANT
SHEATH PINNACLE 8F 10CM (SHEATH) ×1 IMPLANT
SHEATH PROBE COVER 6X72 (BAG) ×1 IMPLANT
STATION PROTECTION PRESSURIZED (MISCELLANEOUS) IMPLANT
STENT WALLSTENTÂ 16X90X75 (Permanent Stent) ×1 IMPLANT
TRAY PV CATH (CUSTOM PROCEDURE TRAY) ×1 IMPLANT
WIRE TORQFLEX AUST .018X40CM (WIRE) ×2 IMPLANT

## 2019-12-11 NOTE — Progress Notes (Signed)
ANTICOAGULATION CONSULT NOTE  Pharmacy Consult for Heparin Indication: DVT  No Known Allergies  Patient Measurements: Height: 5\' 8"  (172.7 cm) Weight: (!) 144.6 kg (318 lb 11.2 oz) IBW/kg (Calculated) : 68.4 Heparin Dosing Weight: 105.2 kg  Vital Signs: Temp: 98 F (36.7 C) (11/22 2139) Temp Source: Oral (11/22 2139) BP: 126/74 (11/22 2139) Pulse Rate: 87 (11/22 2139)  Labs: Recent Labs    12/09/19 0137 12/09/19 1127 12/10/19 0604 12/10/19 0604 12/10/19 1121 12/11/19 0829 12/11/19 2111  HGB 11.2*  --  11.7*  --   --  11.8*  --   HCT 36.3*  --  37.1*  --   --  37.8*  --   PLT 197  --  195  --   --  245  --   HEPARINUNFRC 0.14*   < > 0.47   < > 0.67 0.31 0.60  CREATININE 1.12  --  1.17  --   --  1.20  --    < > = values in this interval not displayed.    Estimated Creatinine Clearance: 93.9 mL/min (by C-G formula based on SCr of 1.2 mg/dL).   Assessment: 81 YOM admitted with LLE DVT. Mechanical thrombectomy planned for Monday 12/11/19. Pharmacy consulted to dose heparin.  S/p mechanical thrombectomy, stent of L common iliac vein, and balloon angioplasty of L common femoral vein 11/22. Heparin resumed 4 hours after sheath removal. CBC stable - Hgb 11.8, plt 245.  PM update - heparin level therapeutic at 0.6. No active bleed issues reported.  Goal of Therapy:  Heparin level 0.3-0.7 units/ml Monitor platelets by anticoagulation protocol: Yes   Plan:  Continue heparin infusion at 2400 units/hr Check confirmatory heparin level with AM labs Daily CBC, and s/sx of bleeding   Arturo Morton, PharmD, BCPS Please check AMION for all [place] contact numbers Clinical Pharmacist 12/11/2019 10:18 PM

## 2019-12-11 NOTE — Progress Notes (Signed)
ANTICOAGULATION CONSULT NOTE  Pharmacy Consult for Heparin Indication: DVT  No Known Allergies  Patient Measurements: Height: 5\' 8"  (172.7 cm) Weight: (!) 151.1 kg (333 lb 3.2 oz) IBW/kg (Calculated) : 68.4 Heparin Dosing Weight: 105.2 kg  Vital Signs: Temp: 97.8 F (36.6 C) (11/22 0856) Temp Source: Oral (11/22 0856) BP: 160/97 (11/22 1300) Pulse Rate: 91 (11/22 1300)  Labs: Recent Labs    12/09/19 0137 12/09/19 1127 12/10/19 0604 12/10/19 1121 12/11/19 0829  HGB 11.2*  --  11.7*  --  11.8*  HCT 36.3*  --  37.1*  --  37.8*  PLT 197  --  195  --  245  HEPARINUNFRC 0.14*   < > 0.47 0.67 0.31  CREATININE 1.12  --  1.17  --  1.20   < > = values in this interval not displayed.    Estimated Creatinine Clearance: 96.3 mL/min (by C-G formula based on SCr of 1.2 mg/dL).   Assessment: 26 YOM admitted with LLE DVT. Mechanical thrombectomy planned for Monday 12/11/19. Pharmacy consulted to dose heparin.  S/p mechanical thrombectomy, stent of L common iliac vein, and balloon angioplasty of L common femoral vein 11/22. Plan to restart heparin 4 hours after sheath removal (documented on 11/22@1056 ) - was on heparin infusion at 2400 units/hr with therapeutic level this morning. Hgb 11.8, plt 245. No s/sx of bleeding.   Goal of Therapy:  Heparin level 0.3-0.7 units/ml Monitor platelets by anticoagulation protocol: Yes   Plan:  Restart heparin infusion at 2400 units/hr on 11/22@1500  Order heparin level in 6 hours Daily heparin level, CBC, and s/sx of bleeding  Antonietta Jewel, PharmD, [place] Clinical Pharmacist  Phone: [phone] 12/11/2019 1:57 PM  Please check AMION for all [place] phone numbers After 10:00 PM, call [place] [phone]

## 2019-12-11 NOTE — Plan of Care (Signed)

## 2019-12-11 NOTE — Progress Notes (Signed)
[facility] PROGRESS NOTE  Patient: Timothy Le OPP:[id number]   PCP: Vevelyn Francois, NP DOB: [birth year]   DOA: 12/04/2019   DOS: 12/11/2019    Subjective: Patient seen most of the days in the Cath Lab. Tolerated procedure very well.  Objective:  Vitals:   12/11/19 1500 12/11/19 1652  BP: (!) 148/96 139/82  Pulse: 78 80  Resp: 20 20  Temp:  98.7 F (37.1 C)  SpO2: 97% 93%    General: Appear in mild distress, no Rash; Oral Mucosa Clear, moist. no Abnormal Neck Mass Or lumps, Conjunctiva normal   Assessment and plan: Continue current care under vascular surgery consultation. Continue heparin DVT treatment. Also started on aspirin after stents. Continue DuoNebs as needed. Continue trach care. Continue insulin with sliding scale. Continue gabapentin.  Author: Berle Mull, MD [facility] 12/11/2019 7:30 PM   If 7PM-7AM, please contact night-coverage at [email or web address]

## 2019-12-11 NOTE — Interval H&P Note (Signed)
History and Physical Interval Note:  12/11/2019 8:44 AM  Timothy Le  has presented today for surgery, with the diagnosis of DVT.  The various methods of treatment have been discussed with the patient and family. After consideration of risks, benefits and other options for treatment, the patient has consented to  Procedure(s): PERIPHERAL VASCULAR THROMBECTOMY (Left) as a surgical intervention.  The patient's history has been reviewed, patient examined, no change in status, stable for surgery.  I have reviewed the patient's chart and labs.  Questions were answered to the patient's satisfaction.     Servando Snare

## 2019-12-11 NOTE — Op Note (Signed)
Patient name: Timothy Le MRN: [id number] DOB: 1960-05-11 Sex: male  12/11/2019 Pre-operative Diagnosis: Extensive left lower extremity DVT Post-operative diagnosis:  Same Surgeon:  Erlene Quan C. Donzetta Matters, MD Procedure Performed: 1.  Ultrasound-guided cannulation left popliteal vein 2.  Left lower extremity and central venography 3.  Intravascular ultrasound of IVC, left common external leg veins, left common femoral vein and femoral vein 4.  Mechanical thrombectomy of IVC, left common external leg veins, left common femoral and femoral veins with Inari Clottriever 5.  Stent of left common iliac vein with 16 x 90 mm Wallstent 6.  Balloon angioplasty of left common femoral and femoral vein with 8 mm balloon 7.  Moderate sedation with fentanyl Versed for 73 minutes   Indications: 59 year old male with recent history of extensive left lower extremity DVT.  He is indicated for left lower extremity venography with vascular ultrasound and possible intervention.  Findings: There was extensive DVT extending from his femoral vein all the way up into his IVC.  He did have a small channel in the common femoral vein as well as the external leg vein but the common iliac itself at the confluence of the IVC was occluded as was the femoral vein throughout.  After mechanical thrombectomy we did establish a flow channel throughout the entire left lower extremity.  We did stent where previously was approximately 4 mm diameter we extended out to 12 mm diameter with a 60 mm stent.  We had a flow channel through the femoral and common femoral veins up to the external leg vein we did balloon this with an 8 mm balloon and a completion was much improved and there was even a flow channel by intravascular ultrasound as well.   Procedure:  The patient was identified in the holding area and taken to room 8.  The patient was then placed prone on the operating table.  He was sterilely prepped and draped.  A timeout was  called.  Moderate sedation was administered with fentanyl and Versed and a nurse monitor his vital signs throughout the case.  Ultrasound was used to identify the popliteal vein.  The area anesthetized 1% lidocaine cannulated micropuncture needle followed the wire and sheath.  An image was saved the permanent record.  The vein was not compressible consistent with acute occlusion there was no.  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  Patient at this time was given 15,000 units of heparin later was given additional 5000.  We then placed an 8 French sheath.  We used Glidewire vantage and very catheter to cross into the IVC we performed central venogram.  We then placed the wire into the right subclavian vein and this was marked on the drapes.  We then placed the mechanical thrombectomy Inari sheath under fluoroscopic guidance.  We then performed mechanical thrombectomy for a total of 5 passes.  Completion intravascular ultrasound demonstrated possible residual thrombus that appeared chronic in the common femoral vein.  We then identified what appeared to be a compressed common iliac vein.  We primarily stented this with 16 x 90 stent and postdilated with 14 mm balloon.  Completion demonstrated patent stent with 12 mm lumen where previously was 4 mm.  We then performed mechanical thrombectomy of the common femoral and external leg veins we removed chronic appearing thrombus there.  We then ballooned these with 8 mm balloon down to the femoral vein with a millimeter balloon.  Completion IVUS demonstrated flow channel.  We also demonstrated strong flow channel by  venography at completion.  Satisfied with this we removed our wire.  We placed a suture bolster and removed our sheath.  He tolerated procedure without any complication.   Contrast: 40cc  Cayenne Breault C. Donzetta Matters, MD [facility] [place] Office: [phone] Pager: [phone]

## 2019-12-12 DIAGNOSIS — I824Z2 Acute embolism and thrombosis of unspecified deep veins of left distal lower extremity: Secondary | ICD-10-CM | POA: Diagnosis not present

## 2019-12-12 LAB — BASIC METABOLIC PANEL
Anion gap: 10 (ref 5–15)
BUN: 19 mg/dL (ref 6–20)
CO2: 27 mmol/L (ref 22–32)
Calcium: 9 mg/dL (ref 8.9–10.3)
Chloride: 99 mmol/L (ref 98–111)
Creatinine, Ser: 1.05 mg/dL (ref 0.61–1.24)
GFR, Estimated: >60 mL/min (ref >60)
Glucose, Bld: 130 mg/dL — ABNORMAL HIGH (ref 70–99)
Potassium: 3.9 mmol/L (ref 3.5–5.1)
Sodium: 136 mmol/L (ref 135–145)

## 2019-12-12 LAB — CBC
HCT: 36.1 % — ABNORMAL LOW (ref 39.0–52.0)
Hemoglobin: 11.2 g/dL — ABNORMAL LOW (ref 13.0–17.0)
MCH: 26.2 pg (ref 26.0–34.0)
MCHC: 31 g/dL (ref 30.0–36.0)
MCV: 84.5 fL (ref 80.0–100.0)
Platelets: 199 10*3/uL (ref 150–400)
RBC: 4.27 MIL/uL (ref 4.22–5.81)
RDW: 15.6 % — ABNORMAL HIGH (ref 11.5–15.5)
WBC: 7.8 10*3/uL (ref 4.0–10.5)
nRBC: 0 % (ref 0.0–0.2)

## 2019-12-12 LAB — HEPARIN LEVEL (UNFRACTIONATED): Heparin Unfractionated: 0.96 IU/mL — ABNORMAL HIGH (ref 0.30–0.70)

## 2019-12-12 LAB — GLUCOSE, CAPILLARY
Glucose-Capillary: 115 mg/dL — ABNORMAL HIGH (ref 70–99)
Glucose-Capillary: 147 mg/dL — ABNORMAL HIGH (ref 70–99)
Glucose-Capillary: 159 mg/dL — ABNORMAL HIGH (ref 70–99)
Glucose-Capillary: 133 mg/dL — ABNORMAL HIGH (ref 70–99)

## 2019-12-12 MED ORDER — APIXABAN 5 MG PO TABS
5.0000 mg | ORAL_TABLET | Freq: Two times a day (BID) | ORAL | Status: DC
  Administered 2019-12-12 – 2019-12-13 (×2): 5 mg via ORAL
  Filled 2019-12-12 (×2): qty 1

## 2019-12-12 MED ORDER — APIXABAN 5 MG PO TABS
10.0000 mg | ORAL_TABLET | Freq: Two times a day (BID) | ORAL | Status: DC
  Administered 2019-12-12: 10 mg via ORAL
  Filled 2019-12-12: qty 2

## 2019-12-12 MED ORDER — APIXABAN 5 MG PO TABS: 5.0000 mg | ORAL_TABLET | Freq: Two times a day (BID) | ORAL | Status: DC

## 2019-12-12 MED FILL — Heparin Sod (Porcine)-NaCl IV Soln 1000 Unit/500ML-0.9%: INTRAVENOUS | Qty: 500 | Status: AC

## 2019-12-12 MED FILL — Lidocaine HCl Local Preservative Free (PF) Inj 1%: INTRAMUSCULAR | Qty: 30 | Status: CN

## 2019-12-12 NOTE — Progress Notes (Signed)
ANTICOAGULATION CONSULT NOTE  Pharmacy Consult for Heparin Indication: DVT  No Known Allergies  Patient Measurements: Height: 5\' 8"  (172.7 cm) Weight: (!) 144.6 kg (318 lb 11.2 oz) IBW/kg (Calculated) : 68.4 Heparin Dosing Weight: 105.2 kg  Vital Signs: Temp: 98.8 F (37.1 C) (11/23 0050) Temp Source: Axillary (11/23 0050) BP: 120/77 (11/23 0917) Pulse Rate: 80 (11/23 1126)  Labs: Recent Labs    12/10/19 0604 12/10/19 1121 12/11/19 0829 12/11/19 2111 12/12/19 0937  HGB 11.7*  --  11.8* 11.2*  --   HCT 37.1*  --  37.8* 36.1*  --   PLT 195  --  245 199  --   HEPARINUNFRC 0.47   < > 0.31 0.60 0.96*  CREATININE 1.17  --  1.20 1.05  --    < > = values in this interval not displayed.    Estimated Creatinine Clearance: 107.3 mL/min (by C-G formula based on SCr of 1.05 mg/dL).   Assessment: 76 YOM admitted with LLE DVT. Mechanical thrombectomy planned for Monday 12/11/19. Pharmacy consulted to dose heparin.  S/p mechanical thrombectomy, stent of L common iliac vein, and balloon angioplasty of L common femoral vein 11/22. -heparin level above goal, CBC stable  Goal of Therapy:  Heparin level 0.3-0.7 units/ml Monitor platelets by anticoagulation protocol: Yes   Plan:  -Decrease heparin infusion to 2250 units/hr -Heparin level in 6 hours and daily wth CBC daily -Will follow plans for oral anticoagulation  Hildred Laser, PharmD Clinical Pharmacist **Pharmacist phone directory can now be found on [email or web address] ([email or web address]).  Listed under [place].

## 2019-12-12 NOTE — Progress Notes (Signed)
[facility] for Heparin>>apixaban Indication: DVT  No Known Allergies  Patient Measurements: Height: 5\' 8"  (172.7 cm) Weight: (!) 144.6 kg (318 lb 11.2 oz) IBW/kg (Calculated) : 68.4 Heparin Dosing Weight: 105.2 kg  Vital Signs: Temp: 98.3 F (36.8 C) (11/23 1700) Temp Source: Oral (11/23 1700) BP: 100/77 (11/23 1700) Pulse Rate: 77 (11/23 1700)  Labs: Recent Labs    12/10/19 0604 12/10/19 1121 12/11/19 0829 12/11/19 2111 12/12/19 0937  HGB 11.7*  --  11.8* 11.2*  --   HCT 37.1*  --  37.8* 36.1*  --   PLT 195  --  245 199  --   HEPARINUNFRC 0.47   < > 0.31 0.60 0.96*  CREATININE 1.17  --  1.20 1.05  --    < > = values in this interval not displayed.    Estimated Creatinine Clearance: 107.3 mL/min (by C-G formula based on SCr of 1.05 mg/dL).   Assessment: 28 YOM admitted with LLE DVT. Mechanical thrombectomy planned for Monday 12/11/19. Pharmacy consulted to dose heparin.  S/p mechanical thrombectomy, stent of L common iliac vein, and balloon angioplasty of L common femoral vein 11/22.   Plan to transition to PO apixaban today. Pt has already received IV heparin for 7d so we will start with apixaban maintenance dose.    Scr 1.05, hgb 11.2, plt wnl  Goal of Therapy:  Monitor platelets by anticoagulation protocol: Yes   Plan:   Dc heparin Apixaban 5mg  PO BID   Onnie Boer, PharmD, [place], AAHIVP, CPP Infectious Disease Pharmacist 12/12/2019 7:45 PM

## 2019-12-12 NOTE — Progress Notes (Addendum)
  Progress Note    12/12/2019 7:42 AM 1 Day Post-Op  Subjective:  Has left compression hose pulled down to ankle due to "hurting"   Vitals:   12/12/19 0423 12/12/19 0424  BP:    Pulse: 69 64  Resp:    Temp:    SpO2: (!) 81% 92%    Physical Exam: Cardiac:  RRR Lungs:  Non-labored Extremities:  Moderate LLE edema. Calf soft. Pop space puncture site without bleeding or hematoma   CBC    Component Value Date/Time   WBC 7.8 12/11/2019 2111   RBC 4.27 12/11/2019 2111   HGB 11.2 (L) 12/11/2019 2111   HCT 36.1 (L) 12/11/2019 2111   PLT 199 12/11/2019 2111   MCV 84.5 12/11/2019 2111   MCH 26.2 12/11/2019 2111   MCHC 31.0 12/11/2019 2111   RDW 15.6 (H) 12/11/2019 2111   LYMPHSABS 1.9 09/22/2019 1915   MONOABS 0.8 09/22/2019 1915   EOSABS 0.2 09/22/2019 1915   BASOSABS 0.1 09/22/2019 1915    BMET    Component Value Date/Time   NA 136 12/11/2019 2111   NA 144 10/12/2019 0904   K 3.9 12/11/2019 2111   CL 99 12/11/2019 2111   CO2 27 12/11/2019 2111   GLUCOSE 130 (H) 12/11/2019 2111   BUN 19 12/11/2019 2111   BUN 23 10/12/2019 0904   CREATININE 1.05 12/11/2019 2111   CALCIUM 9.0 12/11/2019 2111   GFRNONAA >60 12/11/2019 2111   GFRAA 85 10/12/2019 0904     Intake/Output Summary (Last 24 hours) at 12/12/2019 0742 Last data filed at 12/12/2019 0300 Gross per 24 hour  Intake 1178.46 ml  Output 500 ml  Net 678.46 ml    HOSPITAL MEDICATIONS Scheduled Meds: . allopurinol  100 mg Oral Daily  . aspirin EC  81 mg Oral Daily  . atorvastatin  10 mg Oral Daily  . diclofenac Sodium  2 g Topical QID  . gabapentin  200 mg Oral TID  . insulin aspart  0-5 Units Subcutaneous QHS  . insulin aspart  0-9 Units Subcutaneous TID WC  . insulin detemir  17 Units Subcutaneous BID  . mometasone-formoterol  2 puff Inhalation BID  . pantoprazole  40 mg Oral QHS  . QUEtiapine  100 mg Oral BID  . sertraline  100 mg Oral Daily  . sodium chloride flush  3 mL Intravenous Q12H  .  torsemide  10 mg Oral Daily   Continuous Infusions: . sodium chloride Stopped (12/11/19 0733)  . sodium chloride    . heparin 2,400 Units/hr (12/12/19 0300)   PRN Meds:.sodium chloride, acetaminophen, bisacodyl, hydrALAZINE, ipratropium-albuterol, labetalol, ondansetron (ZOFRAN) IV, ondansetron **OR** [DISCONTINUED] ondansetron (ZOFRAN) IV, oxyCODONE, polyethylene glycol, sodium chloride flush, traZODone  Assessment:Extensive left lower extremity DVT. S/p mechanical thrombectomy,angioplasty and left VIC Wasllstent placement.       Plan: -Heparin infusion continues. Encourage to keep compression stockings in place and elevate LLE. -DVT prophylaxis:  Heparin infusion   Risa Grill, PA-C [facility] [phone] 12/12/2019  7:42 AM   I have independently interviewed and examined patient and agree with PA assessment and plan above.  Okay for Eliquis.  Compression stockings left lower extremity.  He is okay for discharge from a vascular surgery standpoint I will follow him up in 4 to 6 weeks with IVC iliac duplex.  Amaiyah Nordhoff C. Donzetta Matters, MD [facility] [place] Office: [phone] Pager: [phone]

## 2019-12-12 NOTE — Progress Notes (Signed)
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 CTA negative for PE. Currently plan is switched to oral anticoagulation and monitor.  Assessment and Plan: Acute left lower extremity DVT. Doppler shows evidence of DVT extending from common femoral vein all the way to the distal vein system. CT PE negative for pulmonary embolism. Pain still unchanged. Significant edema as well. Vascular surgery was consulted.  Appreciate assistance. Underwent mechanical thrombectomy of IVC, left common external leg vein, left common femoral vein, stenting of left common iliac vein, balloon angioplasty of left common femoral and femoral vein.  Initially in the morning vascular surgery's plan was to continue IV heparin.  Later in the afternoon plan is to switch to Eliquis. Monitor overnight. PT OT consulted as well.  Chronic Hypoxic Respiratory Failure due to OSA/OHS and COPD: Tracheostomy status. He wears BiPAP nightly. Follows up with pulmonary outpatient. Currently respiratory status stable. Monitor.  Chronic Diastolic Heart Failure: Euvolemic.   Resuming diuretic.  Monitor. Cough and breathing as well as edema all improving after initiation of diuretic again.  Insulin Dependent Diabetes: Continue Levemir 17 units nightly and SSi   Depression/Anxiety:  Ok to continue home psych meds Seroquel, Zoloft, and Trazodone  Essential Hypertension: Hold Lopressor. Hold  home meds.  Morbid obesity Placing the patient high risk for poor outcome. Also at risk for recurrent DVTs and does make me suspect that he may require long-term anticoagulation instead of 3 months. Also limiting options given his high BMI for anticoagulation. Body mass index is 48.46 kg/m.    Interventions:   Diet: Heart healthy diet DVT Prophylaxis: Therapeutic Anticoagulation with Eliquis and heparin   apixaban (ELIQUIS) tablet 10 mg  apixaban (ELIQUIS) tablet 5 mg    Advance goals of care discussion: Full code  Family Communication: no family was present at bedside, at the time of interview.   Disposition:  Status is: Inpatient  Remains inpatient appropriate because:Inpatient level of care appropriate due to severity of illness   Dispo: The patient is from: Home              Anticipated d/c is to: Home              Anticipated d/c date is: 1 day              Patient currently is not medically stable to d/c.        Subjective: No nausea no vomiting.  No fever no chills.  Breathing okay.  Swelling in the leg is improving.  Pain is okay as well.  Physical Exam:  General: Appear in mild distress, no Rash; Oral Mucosa Clear, moist. no Abnormal Neck Mass Or lumps, Conjunctiva normal  Cardiovascular: S1 and S2 Present, no Murmur, Respiratory: good respiratory effort, Bilateral Air entry present and CTA, no Crackles, no wheezes Abdomen: Bowel Sound present, Soft and no tenderness Extremities: bilateral  Pedal edema Neurology: alert and oriented to time, place, and person affect appropriate. no new focal deficit  Gait not checked due to patient safety concerns  Vitals:   12/12/19 0917 12/12/19 1126 12/12/19 1519 12/12/19 1700  BP: 120/77   100/77  Pulse: 78 80  77  Resp: 20 20 18 18   Temp:    98.3 F (36.8 C)  TempSrc:    Oral  SpO2: 95% 97%  99%  Weight:      Height:        Intake/Output Summary (Last 24 hours) at 12/12/2019 1925 Last data filed at  12/12/2019 1828 Gross per 24 hour  Intake 1425.91 ml  Output 3100 ml  Net -1674.09 ml   Filed Weights   12/04/19 2000 12/11/19 1420  Weight: (!) 151.1 kg (!) 144.6 kg    Data Reviewed: I have personally reviewed and interpreted daily labs, tele strips, imagings as discussed above. I reviewed all nursing notes, pharmacy notes, vitals, pertinent old records I have discussed plan of care as described above with RN and patient/family.  CBC: Recent Labs  Lab 12/08/19 0304 12/09/19 0137 12/10/19 0604 12/11/19 0829 12/11/19 2111  WBC 5.8 6.0 5.9 5.6 7.8  HGB 11.3* 11.2* 11.7* 11.8* 11.2*  HCT 36.8* 36.3* 37.1* 37.8* 36.1*  MCV 86.2 85.6 86.1 86.5 84.5  PLT 172 197 195 245 785   Basic Metabolic Panel: Recent Labs  Lab 12/08/19 0304 12/09/19 0137 12/10/19 0604 12/11/19 0829 12/11/19 2111  NA 139 137 139 138 136  K 4.4 3.9 4.1 3.8 3.9  CL 101 100 103 100 99  CO2 31 28 24 29 27   GLUCOSE 157* 157* 121* 133* 130*  BUN 17 16 18 20 19   CREATININE 1.24 1.12 1.17 1.20 1.05  CALCIUM 9.1 9.1 9.2 9.4 9.0    Studies: No results found.  Scheduled Meds: . allopurinol  100 mg Oral Daily  . apixaban  10 mg Oral BID   Followed by  . [START ON 12/19/2019] apixaban  5 mg Oral BID  . aspirin EC  81 mg Oral Daily  . atorvastatin  10 mg Oral Daily  . diclofenac Sodium  2 g Topical QID  . gabapentin  200 mg Oral TID  . insulin aspart  0-5 Units Subcutaneous QHS  . insulin aspart  0-9 Units Subcutaneous TID WC  . insulin detemir  17 Units Subcutaneous BID  . mometasone-formoterol  2 puff Inhalation BID  . pantoprazole  40 mg Oral QHS  . QUEtiapine  100 mg Oral BID  . sertraline  100 mg Oral Daily  . sodium chloride flush  3 mL Intravenous Q12H  . torsemide  10 mg Oral Daily   Continuous Infusions: . sodium chloride     PRN Meds: sodium chloride, acetaminophen, bisacodyl, hydrALAZINE, ipratropium-albuterol, labetalol, ondansetron (ZOFRAN) IV, ondansetron **OR** [DISCONTINUED]  ondansetron (ZOFRAN) IV, oxyCODONE, polyethylene glycol, sodium chloride flush, traZODone  Time spent: 35 minutes  Author: Berle Mull, MD [facility] 12/12/2019 7:25 PM  To reach On-call, see care teams to locate the attending and reach out via [email or web address]. Between 7PM-7AM, please contact night-coverage If you still have difficulty reaching the attending provider, please page the [employer] (Director on Call) for [facility] on amion for assistance.

## 2019-12-12 NOTE — Discharge Instructions (Addendum)

## 2019-12-12 NOTE — Plan of Care (Signed)

## 2019-12-13 ENCOUNTER — Other Ambulatory Visit (HOSPITAL_COMMUNITY): Payer: Self-pay | Admitting: Internal Medicine

## 2019-12-13 DIAGNOSIS — I5032 Chronic diastolic (congestive) heart failure: Secondary | ICD-10-CM | POA: Diagnosis not present

## 2019-12-13 DIAGNOSIS — I10 Essential (primary) hypertension: Secondary | ICD-10-CM | POA: Diagnosis not present

## 2019-12-13 DIAGNOSIS — J9611 Chronic respiratory failure with hypoxia: Secondary | ICD-10-CM | POA: Diagnosis not present

## 2019-12-13 DIAGNOSIS — I824Z2 Acute embolism and thrombosis of unspecified deep veins of left distal lower extremity: Secondary | ICD-10-CM | POA: Diagnosis not present

## 2019-12-13 LAB — CBC
HCT: 35.1 % — ABNORMAL LOW (ref 39.0–52.0)
Hemoglobin: 10.8 g/dL — ABNORMAL LOW (ref 13.0–17.0)
MCH: 26.2 pg (ref 26.0–34.0)
MCHC: 30.8 g/dL (ref 30.0–36.0)
MCV: 85.2 fL (ref 80.0–100.0)
Platelets: 186 10*3/uL (ref 150–400)
RBC: 4.12 MIL/uL — ABNORMAL LOW (ref 4.22–5.81)
RDW: 15.8 % — ABNORMAL HIGH (ref 11.5–15.5)
WBC: 6 10*3/uL (ref 4.0–10.5)
nRBC: 0 % (ref 0.0–0.2)

## 2019-12-13 LAB — BASIC METABOLIC PANEL
Anion gap: 12 (ref 5–15)
BUN: 17 mg/dL (ref 6–20)
CO2: 28 mmol/L (ref 22–32)
Calcium: 8.9 mg/dL (ref 8.9–10.3)
Chloride: 99 mmol/L (ref 98–111)
Creatinine, Ser: 1.13 mg/dL (ref 0.61–1.24)
GFR, Estimated: >60 mL/min (ref >60)
Glucose, Bld: 147 mg/dL — ABNORMAL HIGH (ref 70–99)
Potassium: 3.8 mmol/L (ref 3.5–5.1)
Sodium: 139 mmol/L (ref 135–145)

## 2019-12-13 LAB — GLUCOSE, CAPILLARY
Glucose-Capillary: 125 mg/dL — ABNORMAL HIGH (ref 70–99)
Glucose-Capillary: 149 mg/dL — ABNORMAL HIGH (ref 70–99)

## 2019-12-13 LAB — MAGNESIUM: Magnesium: 1.5 mg/dL — ABNORMAL LOW (ref 1.7–2.4)

## 2019-12-13 MED ORDER — INSULIN DETEMIR 100 UNIT/ML ~~LOC~~ SOLN: 33.0000 [IU] | Freq: Two times a day (BID) | SUBCUTANEOUS | 0 refills | Status: DC

## 2019-12-13 MED ORDER — APIXABAN 5 MG PO TABS: 5.0000 mg | ORAL_TABLET | Freq: Two times a day (BID) | ORAL | 0 refills | Status: DC

## 2019-12-13 MED ORDER — MAGNESIUM SULFATE IN D5W 1-5 GM/100ML-% IV SOLN
1.0000 g | Freq: Once | INTRAVENOUS | Status: AC
  Administered 2019-12-13: 1 g via INTRAVENOUS
  Filled 2019-12-13: qty 100

## 2019-12-13 MED ORDER — ASPIRIN 81 MG PO TBEC: 81.0000 mg | DELAYED_RELEASE_TABLET | Freq: Every day | ORAL | 0 refills | Status: DC

## 2019-12-13 MED FILL — ELIQUIS 5 MG TABLET: 5 | 90 days supply | Qty: 180 | Fill #0

## 2019-12-13 NOTE — Evaluation (Signed)
Physical Therapy Evaluation Patient Details Name: Timothy Le MRN: [id number] DOB: May 13, 1960 Today's Date: 12/13/2019   History of Present Illness  Pt adm 11/15 with acute LLE DVT. Underwent mechanical thrombectomy of IVC, left common external leg vein, left common femoral vein, stenting of left common iliac vein, balloon angioplasty of left common femoral and femoral vein on 12/11/19. PMH - morbid obesity, copd, chf, chronic respiratory failure with trach.  Clinical Impression  Pt presents to PT with decr activity tolerance with gait due to illness and inactivity. Expect pt will make good progress back to baseline with mobility. Will follow acutely but do not feel pt will need PT after DC.      Follow Up Recommendations No PT follow up    Equipment Recommendations  None recommended by PT    Recommendations for Other Services       Precautions / Restrictions Precautions Precautions: Other (comment) Precaution Comments: trach Restrictions Weight Bearing Restrictions: No      Mobility  Bed Mobility               General bed mobility comments: Pt up with OT    Transfers Overall transfer level: Modified independent Equipment used: None                Ambulation/Gait Ambulation/Gait assistance: Supervision Gait Distance (Feet): 125 Feet Assistive device: Rolling walker (2 wheeled);None Gait Pattern/deviations: Step-through pattern;Decreased stride length Gait velocity: decr Gait velocity interpretation: 1.31 - 2.62 ft/sec, indicative of limited community ambulator General Gait Details: In room pt amb without assistive device but reaching for support of furniture. Used rolling walker for longer amb in hall. Pt with 3 standing rest breaks  Stairs            Wheelchair Mobility    Modified Rankin (Stroke Patients Only)       Balance Overall balance assessment: Mild deficits observed, not formally tested                                            Pertinent Vitals/Pain      Home Living Family/patient expects to be discharged to:: Private residence Living Arrangements: Alone Available Help at Discharge: Family;Available PRN/intermittently Type of Home: Apartment Home Access: Level entry     Home Layout: One level        Prior Function Level of Independence: Independent         Comments: Assist for cleaning and taking trash out     Hand Dominance   Dominant Hand: Right    Extremity/Trunk Assessment   Upper Extremity Assessment Upper Extremity Assessment: Defer to OT evaluation    Lower Extremity Assessment Lower Extremity Assessment: Overall WFL for tasks assessed       Communication   Communication: Passy-Muir valve;No difficulties  Cognition Arousal/Alertness: Awake/alert Behavior During Therapy: WFL for tasks assessed/performed Overall Cognitive Status: Within Functional Limits for tasks assessed                                        General Comments General comments (skin integrity, edema, etc.): Pt with trach and Passy Muir. Pt on RA with activity with SpO2 >93%.     Exercises     Assessment/Plan    PT Assessment Patient needs continued PT services  PT  Problem List Decreased activity tolerance;Decreased mobility;Obesity       PT Treatment Interventions DME instruction;Gait training;Functional mobility training;Therapeutic activities;Therapeutic exercise;Patient/family education    PT Goals (Current goals can be found in the Care Plan section)  Acute Rehab PT Goals Patient Stated Goal: go home PT Goal Formulation: With patient Time For Goal Achievement: 12/20/19 Potential to Achieve Goals: Good    Frequency Min 3X/week   Barriers to discharge        Co-evaluation               AM-PAC PT "6 Clicks" Mobility  Outcome Measure Help needed turning from your back to your side while in a flat bed without using bedrails?: None Help needed  moving from lying on your back to sitting on the side of a flat bed without using bedrails?: None Help needed moving to and from a bed to a chair (including a wheelchair)?: None Help needed standing up from a chair using your arms (e.g., wheelchair or bedside chair)?: None Help needed to walk in hospital room?: None Help needed climbing 3-5 steps with a railing? : A Little 6 Click Score: 23    End of Session   Activity Tolerance: Patient tolerated treatment well Patient left: in chair;with call bell/phone within reach;with chair alarm set Nurse Communication: Mobility status PT Visit Diagnosis: Other abnormalities of gait and mobility (R26.89)    Time: 9233-0076 PT Time Calculation (min) (ACUTE ONLY): 10 min   Charges:   PT Evaluation $PT Eval Moderate Complexity: 1 Mod          Timothy Le [place] Pager [phone] Office [place] 12/13/2019, 10:53 AM

## 2019-12-13 NOTE — Plan of Care (Signed)

## 2019-12-13 NOTE — Discharge Summary (Addendum)
Physician Discharge Summary  Timothy Le IOE:[id number] DOB: 09/25/1960 DOA: 12/04/2019  PCP: Vevelyn Francois, NP  Admit date: 12/04/2019 Discharge date: 12/13/2019  Admitted From: Home Disposition: Home  Recommendations for Outpatient Follow-up:  1. Follow up with PCP in 1-2 weeks 2. Follow-up with vascular surgery, Dr. Donzetta Matters in 4-6 weeks 3. Started on Eliquis for left lower extremity DVT  Home Health: No Equipment/Devices: None  Discharge Condition: Stable CODE STATUS: Full code Diet recommendation: Heart healthy/consistent carbohydrate diet  History of present illness: Timothy Le is a 59 year old male with past medical history significant for morbid obesity, OSA, COPD, chronic diastolic congestive heart failure, chronic respiratory failure with tracheostomy in place who presented to the emergency department on 12/04/2019 with left leg swelling and was found to have extensive left lower extremity DVT involving the left common femoral vein, SF junction, as well as the proximal left profunda, popliteal and posterior tibial veins. CT angiogram was negative for PE. Patient was started on heparin drip and hospitalist service consulted for further evaluation and management.  Hospital course:  Acute DVT left lower extremity Patient presenting to the ED with swelling to left lower extremity. Vascular ultrasound lower extremity notable for extensive DVT left lower extremity including common femoral vein, femoral vein, PFV, popliteal, PT, and peroneal veins. CT angiogram chest negative for PE. Patient was initially started on a heparin drip. Vascular surgery was consulted. Patient underwent mechanical thrombectomy of IVC, left common external leg veins, left common femoral vein and femoral vein and stents of left common iliac vein was placed with balloon angioplasty of left common femoral and femoral vein. Heparin drip was transitioned to Eliquis. Continue compression stockings lower  extremity. Follow-up with vascular surgery in 4-6 weeks with IVC iliac duplex planned.  Chronic hypoxic respiratory failure OSA/OHS Status post tracheostomy. Continue home BiPAP nightly. Outpatient follow-up with pulmonology, Dr. Annamaria Boots as scheduled.  COPD Continue home Dulera 100-5 mcg 2 puffs twice daily, duo nebs as needed.  Chronic diastolic congestive heart failure, compensated Essential hypertension Continue metoprolol tartrate 12.5 mg p.o. twice daily, torsemide 10 mg p.o. daily.   Type 2 diabetes mellitus, insulin-dependent Hemoglobin A1c 6.5 on 10/12/2019, well controlled. Continue home Levemir 33 units subcutaneously twice daily.  HLD: Continue atorvastatin 10 mg p.o. daily  Gout: Continue allopurinol 1 mg p.o. daily.  Depression/anxiety: Continue home Seroquel, Zoloft, trazodone  GERD: Continue PPI  Morbid obesity Body mass index is 50.15 kg/m. Discussed with patient needs for aggressive weight loss/lifestyle changes this complicates all facets of care.     Discharge Diagnoses:  Principal Problem:   Acute deep vein thrombosis (DVT) of left lower extremity (HCC) Active Problems:   OSA (obstructive sleep apnea)   COPD mixed type (HCC)   Diabetes mellitus type 2, controlled (HCC)   Hypoventilation associated with obesity syndrome (HCC)   Benign essential HTN   Chronic respiratory failure with hypoxia and hypercapnia (HCC)   Chronic diastolic CHF (congestive heart failure) ([place])   Depression with anxiety    Discharge Instructions  Discharge Instructions    (HEART FAILURE PATIENTS) Call MD:  Anytime you have any of the following symptoms: 1) 3 pound weight gain in 24 hours or 5 pounds in 1 week 2) shortness of breath, with or without a dry hacking cough 3) swelling in the hands, feet or stomach 4) if you have to sleep on extra pillows at night in order to breathe.   Complete by: As directed    Call MD for:  difficulty breathing, headache or visual disturbances    Complete by: As directed    Call MD for:  extreme fatigue   Complete by: As directed    Call MD for:  persistant dizziness or light-headedness   Complete by: As directed    Call MD for:  persistant nausea and vomiting   Complete by: As directed    Call MD for:  redness, tenderness, or signs of infection (pain, swelling, redness, odor or green/yellow discharge around incision site)   Complete by: As directed    Call MD for:  severe uncontrolled pain   Complete by: As directed    Call MD for:  temperature >100.4   Complete by: As directed    Diet - low sodium heart healthy   Complete by: As directed    Diet Carb Modified   Complete by: As directed    Increase activity slowly   Complete by: As directed      Allergies as of 12/13/2019   No Known Allergies     Medication List    STOP taking these medications   dextromethorphan-guaiFENesin 30-600 MG 12hr tablet Commonly known as: [place] DM     TAKE these medications   acetaminophen 325 MG tablet Commonly known as: TYLENOL Take 1-2 tablets (325-650 mg total) by mouth every 4 (four) hours as needed for mild pain.   allopurinol 100 MG tablet Commonly known as: ZYLOPRIM Take 1 tablet (100 mg total) by mouth daily.   alum & mag hydroxide-simeth 200-200-20 MG/5ML suspension Commonly known as: MAALOX/MYLANTA Take 30 mLs by mouth every 4 (four) hours as needed for indigestion, heartburn or flatulence.   apixaban 5 MG Tabs tablet Commonly known as: ELIQUIS Take 1 tablet (5 mg total) by mouth 2 (two) times daily.   atorvastatin 10 MG tablet Commonly known as: LIPITOR Take 1 tablet (10 mg total) by mouth daily.   diclofenac Sodium 1 % Gel Commonly known as: VOLTAREN Apply 2 g topically 4 (four) times daily.   gabapentin 100 MG capsule Commonly known as: NEURONTIN Take 2 capsules (200 mg total) by mouth 3 (three) times daily.   insulin detemir 100 UNIT/ML injection Commonly known as: LEVEMIR Inject 0.33 mLs (33 Units  total) into the skin every 12 (twelve) hours.   ipratropium-albuterol 0.5-2.5 (3) MG/3ML Soln Commonly known as: DUONEB Take 3 mLs by nebulization every 6 (six) hours as needed.   metoprolol tartrate 25 MG tablet Commonly known as: LOPRESSOR Take 0.5 tablets (12.5 mg total) by mouth 2 (two) times daily.   mometasone-formoterol 100-5 MCG/ACT Aero Commonly known as: DULERA Inhale 2 puffs into the lungs 2 (two) times daily.   multivitamin with minerals Tabs tablet Take 1 tablet by mouth daily.   nutrition supplement (JUVEN) Pack Take 1 packet by mouth 2 (two) times daily between meals.   Oxycodone HCl 10 MG Tabs Take 0.5 tablets (5 mg total) by mouth daily as needed (severe pain).   pantoprazole 40 MG tablet Commonly known as: PROTONIX Take 1 tablet (40 mg total) by mouth at bedtime.   potassium chloride SA 20 MEQ tablet Commonly known as: KLOR-CON Take 1 tablet (20 mEq total) by mouth 2 (two) times daily.   QUEtiapine 100 MG tablet Commonly known as: SEROQUEL Take 1 tablet (100 mg total) by mouth 2 (two) times daily.   senna-docusate 8.6-50 MG tablet Commonly known as: Senokot-S Take 2 tablets by mouth 2 (two) times daily.   sertraline 100 MG tablet Commonly known as: ZOLOFT  Take 1 tablet (100 mg total) by mouth daily.   torsemide 10 MG tablet Commonly known as: DEMADEX Take 1 tablet (10 mg total) by mouth daily.   traZODone 50 MG tablet Commonly known as: DESYREL Take 0.5-1 tablets (25-50 mg total) by mouth at bedtime as needed for sleep.       Follow-up Information    [facility] Follow up.   Why: [facility] will continue to supply you with your oxygen needs and supplies. Contact information: [place] [address] [phone]              No Known Allergies  Consultations:  Vascular surgery, Dr. Donzetta Matters   Procedures/Studies: CT Angio Chest PE W/Cm &/Or Wo Cm  Result Date: 12/04/2019 CLINICAL DATA:   59 year old male with concern for pulmonary embolism. EXAM: CT ANGIOGRAPHY CHEST WITH CONTRAST TECHNIQUE: Multidetector CT imaging of the chest was performed using the standard protocol during bolus administration of intravenous contrast. Multiplanar CT image reconstructions and MIPs were obtained to evaluate the vascular anatomy. CONTRAST:  83mL OMNIPAQUE IOHEXOL 350 MG/ML SOLN COMPARISON:  Chest CT dated 07/05/2018 and radiograph dated 09/19/2019. FINDINGS: Cardiovascular: There is no cardiomegaly or pericardial effusion. The thoracic aorta is unremarkable. The origins of the great vessels of the aortic arch appear patent as visualized. There is no CT evidence of pulmonary embolism. Mediastinum/Nodes: There is no hilar or mediastinal adenopathy. The esophagus is grossly unremarkable. Lungs/Pleura: There are bibasilar linear atelectasis/scarring. No focal consolidation, pleural effusion, pneumothorax. The central airways are patent. Tracheostomy above the carina. Upper Abdomen: No acute abnormality. Musculoskeletal: Degenerative changes of the spine. Anterior spinal osteophyte compatible with DISH. No acute osseous pathology. Review of the MIP images confirms the above findings. IMPRESSION: No acute intrathoracic pathology. No CT evidence of pulmonary embolism. Electronically Signed   By: Anner Crete M.D.   On: 12/04/2019 19:56   CARDIAC CATHETERIZATION  Result Date: 12/11/2019 Patient name: DARYON REMMERT MRN: [id number] DOB: 1960-10-10 Sex: male 12/11/2019 Pre-operative Diagnosis: Extensive left lower extremity DVT Post-operative diagnosis:  Same Surgeon:  Erlene Quan C. Donzetta Matters, MD Procedure Performed: 1.  Ultrasound-guided cannulation left popliteal vein 2.  Left lower extremity and central venography 3.  Intravascular ultrasound of IVC, left common external leg veins, left common femoral vein and femoral vein 4.  Mechanical thrombectomy of IVC, left common external leg veins, left common femoral and femoral  veins with Inari Clottriever 5.  Stent of left common iliac vein with 16 x 90 mm Wallstent 6.  Balloon angioplasty of left common femoral and femoral vein with 8 mm balloon 7.  Moderate sedation with fentanyl Versed for 73 minutes Indications: 59 year old male with recent history of extensive left lower extremity DVT.  He is indicated for left lower extremity venography with vascular ultrasound and possible intervention. Findings: There was extensive DVT extending from his femoral vein all the way up into his IVC.  He did have a small channel in the common femoral vein as well as the external leg vein but the common iliac itself at the confluence of the IVC was occluded as was the femoral vein throughout.  After mechanical thrombectomy we did establish a flow channel throughout the entire left lower extremity.  We did stent where previously was approximately 4 mm diameter we extended out to 12 mm diameter with a 60 mm stent.  We had a flow channel through the femoral and common femoral veins up to the external leg vein we did  balloon this with an 8 mm balloon and a completion was much improved and there was even a flow channel by intravascular ultrasound as well.  Procedure:  The patient was identified in the holding area and taken to room 8.  The patient was then placed prone on the operating table.  He was sterilely prepped and draped.  A timeout was called.  Moderate sedation was administered with fentanyl and Versed and a nurse monitor his vital signs throughout the case.  Ultrasound was used to identify the popliteal vein.  The area anesthetized 1% lidocaine cannulated micropuncture needle followed the wire and sheath.  An image was saved the permanent record.  The vein was not compressible consistent with acute occlusion there was no.  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  Patient at this time was given 15,000 units of heparin later was given additional 5000.  We then placed an 8 French sheath.  We used Glidewire vantage  and very catheter to cross into the IVC we performed central venogram.  We then placed the wire into the right subclavian vein and this was marked on the drapes.  We then placed the mechanical thrombectomy Inari sheath under fluoroscopic guidance.  We then performed mechanical thrombectomy for a total of 5 passes.  Completion intravascular ultrasound demonstrated possible residual thrombus that appeared chronic in the common femoral vein.  We then identified what appeared to be a compressed common iliac vein.  We primarily stented this with 16 x 90 stent and postdilated with 14 mm balloon.  Completion demonstrated patent stent with 12 mm lumen where previously was 4 mm.  We then performed mechanical thrombectomy of the common femoral and external leg veins we removed chronic appearing thrombus there.  We then ballooned these with 8 mm balloon down to the femoral vein with a millimeter balloon.  Completion IVUS demonstrated flow channel.  We also demonstrated strong flow channel by venography at completion.  Satisfied with this we removed our wire.  We placed a suture bolster and removed our sheath.  He tolerated procedure without any complication. Contrast: 40cc Brandon C. Donzetta Matters, MD [facility] Office: [phone] Pager: [phone]   PERIPHERAL VASCULAR CATHETERIZATION  Result Date: 12/11/2019 Patient name: LINCON SAHLIN MRN: [id number] DOB: 1960/06/08 Sex: male 12/11/2019 Pre-operative Diagnosis: Extensive left lower extremity DVT Post-operative diagnosis:  Same Surgeon:  Erlene Quan C. Donzetta Matters, MD Procedure Performed: 1.  Ultrasound-guided cannulation left popliteal vein 2.  Left lower extremity and central venography 3.  Intravascular ultrasound of IVC, left common external leg veins, left common femoral vein and femoral vein 4.  Mechanical thrombectomy of IVC, left common external leg veins, left common femoral and femoral veins with Inari Clottriever 5.  Stent of left common  iliac vein with 16 x 90 mm Wallstent 6.  Balloon angioplasty of left common femoral and femoral vein with 8 mm balloon 7.  Moderate sedation with fentanyl Versed for 73 minutes Indications: 59 year old male with recent history of extensive left lower extremity DVT.  He is indicated for left lower extremity venography with vascular ultrasound and possible intervention. Findings: There was extensive DVT extending from his femoral vein all the way up into his IVC.  He did have a small channel in the common femoral vein as well as the external leg vein but the common iliac itself at the confluence of the IVC was occluded as was the femoral vein throughout.  After mechanical thrombectomy we did establish a flow channel throughout the entire  left lower extremity.  We did stent where previously was approximately 4 mm diameter we extended out to 12 mm diameter with a 60 mm stent.  We had a flow channel through the femoral and common femoral veins up to the external leg vein we did balloon this with an 8 mm balloon and a completion was much improved and there was even a flow channel by intravascular ultrasound as well.  Procedure:  The patient was identified in the holding area and taken to room 8.  The patient was then placed prone on the operating table.  He was sterilely prepped and draped.  A timeout was called.  Moderate sedation was administered with fentanyl and Versed and a nurse monitor his vital signs throughout the case.  Ultrasound was used to identify the popliteal vein.  The area anesthetized 1% lidocaine cannulated micropuncture needle followed the wire and sheath.  An image was saved the permanent record.  The vein was not compressible consistent with acute occlusion there was no.  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  Patient at this time was given 15,000 units of heparin later was given additional 5000.  We then placed an 8 French sheath.  We used Glidewire vantage and very catheter to cross into the IVC we performed  central venogram.  We then placed the wire into the right subclavian vein and this was marked on the drapes.  We then placed the mechanical thrombectomy Inari sheath under fluoroscopic guidance.  We then performed mechanical thrombectomy for a total of 5 passes.  Completion intravascular ultrasound demonstrated possible residual thrombus that appeared chronic in the common femoral vein.  We then identified what appeared to be a compressed common iliac vein.  We primarily stented this with 16 x 90 stent and postdilated with 14 mm balloon.  Completion demonstrated patent stent with 12 mm lumen where previously was 4 mm.  We then performed mechanical thrombectomy of the common femoral and external leg veins we removed chronic appearing thrombus there.  We then ballooned these with 8 mm balloon down to the femoral vein with a millimeter balloon.  Completion IVUS demonstrated flow channel.  We also demonstrated strong flow channel by venography at completion.  Satisfied with this we removed our wire.  We placed a suture bolster and removed our sheath.  He tolerated procedure without any complication. Contrast: 40cc Brandon C. Donzetta Matters, MD [facility] Office: [phone] Pager: [phone]   VAS [place] IVC/ILIAC (VENOUS ONLY)  Result Date: 12/07/2019 IVC/ILIAC STUDY Indications: Extensive left lower extremity DVT found 12/04/19, including CFV,              FV, PFV, Pop, PT, and peroneal veins Limitations: Air/bowel gas and obesity (BMI >50).  Comparison Study: No prior study of IVC/Iliac on file Performing Technologist: Sharion Dove RVS  Examination Guidelines: A complete evaluation includes B-mode imaging, spectral Doppler, color Doppler, and power Doppler as needed of all accessible portions of each vessel. Bilateral testing is considered an integral part of a complete examination. Limited examinations for reoccurring indications may be performed as noted.  Summary: IVC/Iliac: There is  no evidence of thrombus involving the right common iliac vein. Unable to visualize the left external or internal iliac secondary to bowel gas and body habitus.  *See table(s) above for measurements and observations.  Electronically signed by Servando Snare MD on 12/07/2019 at 3:23:58 PM.   Final    VAS [place] LOWER EXTREMITY VENOUS (DVT) (ONLY [facility] & [facility])  Result  Date: 12/05/2019  Lower Venous DVT Study Indications: Edema.  Limitations: Body habitus and poor ultrasound/tissue interface. Comparison Study: 07/31/19 previous Performing Technologist: Abram Sander RVS  Examination Guidelines: A complete evaluation includes B-mode imaging, spectral Doppler, color Doppler, and power Doppler as needed of all accessible portions of each vessel. Bilateral testing is considered an integral part of a complete examination. Limited examinations for reoccurring indications may be performed as noted. The reflux portion of the exam is performed with the patient in reverse Trendelenburg.  +-----+---------------+---------+-----------+----------+--------------+ RIGHTCompressibilityPhasicitySpontaneityPropertiesThrombus Aging +-----+---------------+---------+-----------+----------+--------------+ CFV  Full           Yes      Yes                                 +-----+---------------+---------+-----------+----------+--------------+   +---------+---------------+---------+-----------+----------+-------------------+ LEFT     CompressibilityPhasicitySpontaneityPropertiesThrombus Aging      +---------+---------------+---------+-----------+----------+-------------------+ CFV      None           No       No                   Age Indeterminate   +---------+---------------+---------+-----------+----------+-------------------+ SFJ      None                                         Age Indeterminate   +---------+---------------+---------+-----------+----------+-------------------+ FV Prox  None                                          Age Indeterminate   +---------+---------------+---------+-----------+----------+-------------------+ FV Mid   None                                         Age Indeterminate   +---------+---------------+---------+-----------+----------+-------------------+ FV Distal               No       No                   Age Indeterminate   +---------+---------------+---------+-----------+----------+-------------------+ PFV      None                                         Age Indeterminate   +---------+---------------+---------+-----------+----------+-------------------+ POP      None           No       No                   Age Indeterminate   +---------+---------------+---------+-----------+----------+-------------------+ PTV      None                                         Age Indeterminate   +---------+---------------+---------+-----------+----------+-------------------+ PERO  Not well visualized +---------+---------------+---------+-----------+----------+-------------------+ EIV                                                   Not visualized      +---------+---------------+---------+-----------+----------+-------------------+     Summary: RIGHT: - No evidence of common femoral vein obstruction.  LEFT: - Findings consistent with age indeterminate deep vein thrombosis involving the left common femoral vein, SF junction, left femoral vein, left proximal profunda vein, left popliteal vein, and left posterior tibial veins. - No cystic structure found in the popliteal fossa.  *See table(s) above for measurements and observations. Electronically signed by Curt Jews MD on 12/05/2019 at 4:41:28 PM.    Final      Subjective: Patient seen and examined bedside, resting comfortably. No complaints this morning. Ready for discharge home. Denies headache, no visual changes, no chest pain, no palpitations, no  shortness of breath, no abdominal pain, no nausea/vomiting/diarrhea, no fever/chills/night sweats, no weakness, no fatigue, no paresthesias. No acute events overnight per nurse staff.  Discharge Exam: Vitals:   12/13/19 0842 12/13/19 1139  BP: 126/63 110/70  Pulse: 67 82  Resp: 20 18  Temp:  97.9 F (36.6 C)  SpO2: 98% 100%   Vitals:   12/13/19 0313 12/13/19 0518 12/13/19 0842 12/13/19 1139  BP:  (!) 108/57 126/63 110/70  Pulse: 65 63 67 82  Resp: 18 18 20 18   Temp:  98.5 F (36.9 C)  97.9 F (36.6 C)  TempSrc:  Oral  Oral  SpO2:  100% 98% 100%  Weight:  (!) 149.6 kg    Height:        General: Pt is alert, awake, not in acute distress, obese Cardiovascular: RRR, S1/S2 +, no rubs, no gallops Respiratory: CTA bilaterally, no wheezing, no rhonchi; tracheostomy noted Abdominal: Soft, NT, ND, bowel sounds + Extremities: no edema, no cyanosis, lower extremities compression stockings noted in place    The results of significant diagnostics from this hospitalization (including imaging, microbiology, ancillary and laboratory) are listed below for reference.     Microbiology: Recent Results (from the past 240 hour(s))  Respiratory Panel by RT PCR (Flu A&B, Covid) - Nasopharyngeal Swab     Status: None   Collection Time: 12/04/19 10:55 PM   Specimen: Nasopharyngeal Swab  Result Value Ref Range Status   SARS Coronavirus 2 by RT PCR NEGATIVE NEGATIVE Final    Comment: (NOTE) SARS-CoV-2 target nucleic acids are NOT DETECTED.  The SARS-CoV-2 RNA is generally detectable in upper respiratoy specimens during the acute phase of infection. The lowest concentration of SARS-CoV-2 viral copies this assay can detect is 131 copies/mL. A negative result does not preclude SARS-Cov-2 infection and should not be used as the sole basis for treatment or other patient management decisions. A negative result may occur with  improper specimen collection/handling, submission of specimen other than  nasopharyngeal swab, presence of viral mutation(s) within the areas targeted by this assay, and inadequate number of viral copies (<131 copies/mL). A negative result must be combined with clinical observations, patient history, and epidemiological information. The expected result is Negative.  Fact Sheet for Patients:  PinkCheek.be  Fact Sheet for Healthcare Providers:  GravelBags.it  This test is no t yet approved or cleared by the [place] FDA and  has been authorized for detection and/or diagnosis of SARS-CoV-2 by FDA under  an Emergency Use Authorization (EUA). This EUA will remain  in effect (meaning this test can be used) for the duration of the COVID-19 declaration under Section 564(b)(1) of the Act, 21 U.S.C. section 360bbb-3(b)(1), unless the authorization is terminated or revoked sooner.     Influenza A by PCR NEGATIVE NEGATIVE Final   Influenza B by PCR NEGATIVE NEGATIVE Final    Comment: (NOTE) The Xpert Xpress SARS-CoV-2/FLU/RSV assay is intended as an aid in  the diagnosis of influenza from Nasopharyngeal swab specimens and  should not be used as a sole basis for treatment. Nasal washings and  aspirates are unacceptable for Xpert Xpress SARS-CoV-2/FLU/RSV  testing.  Fact Sheet for Patients: PinkCheek.be  Fact Sheet for Healthcare Providers: GravelBags.it  This test is not yet approved or cleared by the [place] FDA and  has been authorized for detection and/or diagnosis of SARS-CoV-2 by  FDA under an Emergency Use Authorization (EUA). This EUA will remain  in effect (meaning this test can be used) for the duration of the  Covid-19 declaration under Section 564(b)(1) of the Act, 21  U.S.C. section 360bbb-3(b)(1), unless the authorization is  terminated or revoked. Performed at [facility], [place] [address], [place],  [place] [address]      Labs: BNP (last 3 results) Recent Labs    08/01/19 0528 08/02/19 0355 08/03/19 0348  BNP 85.2 98.8 66.4   Basic Metabolic Panel: Recent Labs  Lab 12/09/19 0137 12/10/19 0604 12/11/19 0829 12/11/19 2111 12/13/19 0210  NA 137 139 138 136 139  K 3.9 4.1 3.8 3.9 3.8  CL 100 103 100 99 99  CO2 28 24 29 27 28   GLUCOSE 157* 121* 133* 130* 147*  BUN 16 18 20 19 17   CREATININE 1.12 1.17 1.20 1.05 1.13  CALCIUM 9.1 9.2 9.4 9.0 8.9  MG  --   --   --   --  1.5*   Liver Function Tests: No results for input(s): AST, ALT, ALKPHOS, BILITOT, PROT, ALBUMIN in the last 168 hours. No results for input(s): LIPASE, AMYLASE in the last 168 hours. No results for input(s): AMMONIA in the last 168 hours. CBC: Recent Labs  Lab 12/09/19 0137 12/10/19 0604 12/11/19 0829 12/11/19 2111 12/13/19 0210  WBC 6.0 5.9 5.6 7.8 6.0  HGB 11.2* 11.7* 11.8* 11.2* 10.8*  HCT 36.3* 37.1* 37.8* 36.1* 35.1*  MCV 85.6 86.1 86.5 84.5 85.2  PLT 197 195 245 199 186   Cardiac Enzymes: No results for input(s): CKTOTAL, CKMB, CKMBINDEX, TROPONINI in the last 168 hours. BNP: Invalid input(s): POCBNP CBG: Recent Labs  Lab 12/12/19 1129 12/12/19 1702 12/12/19 2202 12/13/19 0750 12/13/19 1145  GLUCAP 147* 159* 133* 125* 149*   D-Dimer No results for input(s): DDIMER in the last 72 hours. Hgb A1c No results for input(s): HGBA1C in the last 72 hours. Lipid Profile No results for input(s): CHOL, HDL, LDLCALC, TRIG, CHOLHDL, LDLDIRECT in the last 72 hours. Thyroid function studies No results for input(s): TSH, T4TOTAL, T3FREE, THYROIDAB in the last 72 hours.  Invalid input(s): FREET3 Anemia work up No results for input(s): VITAMINB12, FOLATE, FERRITIN, TIBC, IRON, RETICCTPCT in the last 72 hours. Urinalysis    Component Value Date/Time   COLORURINE YELLOW 07/30/2019 1743   APPEARANCEUR HAZY (A) 07/30/2019 1743   LABSPEC 1.026 07/30/2019 1743   PHURINE 5.0 07/30/2019 1743    GLUCOSEU >=500 (A) 07/30/2019 1743   HGBUR LARGE (A) 07/30/2019 1743   BILIRUBINUR negative 10/12/2019 0951   KETONESUR  NEGATIVE 07/30/2019 1743   PROTEINUR Negative 10/12/2019 0951   PROTEINUR 100 (A) 07/30/2019 1743   UROBILINOGEN 0.2 10/12/2019 0951   NITRITE negative 10/12/2019 0951   NITRITE NEGATIVE 07/30/2019 1743   LEUKOCYTESUR Negative 10/12/2019 0951   LEUKOCYTESUR NEGATIVE 07/30/2019 1743   Sepsis Labs Invalid input(s): PROCALCITONIN,  WBC,  LACTICIDVEN Microbiology Recent Results (from the past 240 hour(s))  Respiratory Panel by RT PCR (Flu A&B, Covid) - Nasopharyngeal Swab     Status: None   Collection Time: 12/04/19 10:55 PM   Specimen: Nasopharyngeal Swab  Result Value Ref Range Status   SARS Coronavirus 2 by RT PCR NEGATIVE NEGATIVE Final    Comment: (NOTE) SARS-CoV-2 target nucleic acids are NOT DETECTED.  The SARS-CoV-2 RNA is generally detectable in upper respiratoy specimens during the acute phase of infection. The lowest concentration of SARS-CoV-2 viral copies this assay can detect is 131 copies/mL. A negative result does not preclude SARS-Cov-2 infection and should not be used as the sole basis for treatment or other patient management decisions. A negative result may occur with  improper specimen collection/handling, submission of specimen other than nasopharyngeal swab, presence of viral mutation(s) within the areas targeted by this assay, and inadequate number of viral copies (<131 copies/mL). A negative result must be combined with clinical observations, patient history, and epidemiological information. The expected result is Negative.  Fact Sheet for Patients:  PinkCheek.be  Fact Sheet for Healthcare Providers:  GravelBags.it  This test is no t yet approved or cleared by the [place] FDA and  has been authorized for detection and/or diagnosis of SARS-CoV-2 by FDA under an Emergency  Use Authorization (EUA). This EUA will remain  in effect (meaning this test can be used) for the duration of the COVID-19 declaration under Section 564(b)(1) of the Act, 21 U.S.C. section 360bbb-3(b)(1), unless the authorization is terminated or revoked sooner.     Influenza A by PCR NEGATIVE NEGATIVE Final   Influenza B by PCR NEGATIVE NEGATIVE Final    Comment: (NOTE) The Xpert Xpress SARS-CoV-2/FLU/RSV assay is intended as an aid in  the diagnosis of influenza from Nasopharyngeal swab specimens and  should not be used as a sole basis for treatment. Nasal washings and  aspirates are unacceptable for Xpert Xpress SARS-CoV-2/FLU/RSV  testing.  Fact Sheet for Patients: PinkCheek.be  Fact Sheet for Healthcare Providers: GravelBags.it  This test is not yet approved or cleared by the [place] FDA and  has been authorized for detection and/or diagnosis of SARS-CoV-2 by  FDA under an Emergency Use Authorization (EUA). This EUA will remain  in effect (meaning this test can be used) for the duration of the  Covid-19 declaration under Section 564(b)(1) of the Act, 21  U.S.C. section 360bbb-3(b)(1), unless the authorization is  terminated or revoked. Performed at [facility], [place] [address], [place],  [address]      Time coordinating discharge: Over 30 minutes  SIGNED:   Scotlynn Noyes J [place] DO  [facility] 12/13/2019, 12:04 PM

## 2019-12-13 NOTE — TOC Transition Note (Signed)
Transition of Care [facility]) - CM/SW Discharge Note   Patient Details  Name: Timothy Le MRN: [id number] Date of Birth: 1960-09-18  [facility] CM/SW Contact:  Bethena Roys, RN Phone Number: 12/13/2019, 1:08 PM   Clinical Narrative: Plan for transition home today. Physical/Occupational Therapy worked with patient and he will not need home health services. Patient is not currently active with [facility]. No further needs from Case Manager at this time.   Final next level of care: Home/Self Care Barriers to Discharge: No Barriers Identified   Patient Goals and CMS Choice Patient states their goals for this hospitalization and ongoing recovery are:: Patient plans to discharge home on Monday, 12/11/2019. CMS Medicare.gov Compare Post Acute Care list provided to:: Patient Choice offered to / list presented to : Patient    Discharge Plan and Services   Discharge Planning Services: CM Consult Post Acute Care Choice: Home Health               Readmission Risk Interventions Readmission Risk Prevention Plan 12/08/2019 09/15/2019  Transportation Screening Complete Complete  PCP or Specialist Appt within 3-5 Days Complete -  HRI or Home Care Consult Complete -  Social Work Consult for [place] Planning/Counseling Complete -  Palliative Care Screening Complete -  Medication Review Press photographer) Complete Complete  PCP or Specialist appointment within 3-5 days of discharge - Complete  HRI or [place] - Complete  SW Recovery Care/Counseling Consult - Complete  [place] - Complete  Some recent data might be hidden

## 2019-12-13 NOTE — Evaluation (Signed)
Occupational Therapy Evaluation Patient Details Name: Timothy Le MRN: [id number] DOB: 06-16-1960 Today's Date: 12/13/2019    History of Present Illness Pt adm 11/15 with acute LLE DVT. Underwent mechanical thrombectomy of IVC, left common external leg vein, left common femoral vein, stenting of left common iliac vein, balloon angioplasty of left common femoral and femoral vein on 12/11/19. PMH - morbid obesity, copd, chf, chronic respiratory failure with trach.   Clinical Impression   Pt PTA: Pt living alone in apartment  With acces with family/friends to assist mostly for cleaning due to fumes of cleaning agents per pt makes him feel unwell and difficult to breathe. Pt modified independent with ADL and mobility in room. Pt standing at sink and mobilizing with furniture mobility. RW in room for stability. Pt at functional baseline for ADL and mobility in room. No further services from OT skilled services. OT signing off. Thank you for this referral. Pt with trach collar O2 on RA  >90% throughout exertion.    Follow Up Recommendations  No OT follow up    Equipment Recommendations  None recommended by OT    Recommendations for Other Services       Precautions / Restrictions Precautions Precautions: Other (comment) Precaution Comments: trach Restrictions Weight Bearing Restrictions: No      Mobility Bed Mobility Overal bed mobility: Modified Independent             General bed mobility comments: no physical assist    Transfers Overall transfer level: Modified independent Equipment used: None                  Balance Overall balance assessment: Mild deficits observed, not formally tested                                         ADL either performed or assessed with clinical judgement   ADL Overall ADL's : Modified independent;At baseline                                       General ADL Comments:  Pt performing ADL  routine, attending to LB ADL in bed and toileting/pericare in BA with no physical assist.  Pt standing at sink for grooming and leaning on elbows. Per pt, this is normal for him.     Vision Baseline Vision/History: No visual deficits Patient Visual Report: No change from baseline Vision Assessment?: No apparent visual deficits     Perception     Praxis      Pertinent Vitals/Pain Pain Assessment: 0-10 Pain Score: 0-No pain Pain Intervention(s): Monitored during session;Repositioned     Hand Dominance Right   Extremity/Trunk Assessment Upper Extremity Assessment Upper Extremity Assessment: Overall WFL for tasks assessed   Lower Extremity Assessment Lower Extremity Assessment: Overall WFL for tasks assessed   Cervical / Trunk Assessment Cervical / Trunk Assessment: Normal   Communication Communication Communication: Passy-Muir valve;No difficulties   Cognition Arousal/Alertness: Awake/alert Behavior During Therapy: WFL for tasks assessed/performed Overall Cognitive Status: Within Functional Limits for tasks assessed                                     General Comments  Pt with trach collar; O2 on RA >90%  with exertion.    Exercises     Shoulder Instructions      Home Living Family/patient expects to be discharged to:: Private residence Living Arrangements: Alone Available Help at Discharge: Family;Available PRN/intermittently Type of Home: Apartment Home Access: Level entry     Home Layout: One level     Bathroom Shower/Tub: Teacher, early years/pre: Handicapped height                Prior Functioning/Environment Level of Independence: Independent        Comments: Assist for cleaning and taking trash out        OT Problem List:        OT Treatment/Interventions:      OT Goals(Current goals can be found in the care plan section) Acute Rehab OT Goals Patient Stated Goal: go home OT Goal Formulation: All assessment  and education complete, DC therapy Potential to Achieve Goals: Good  OT Frequency:     Barriers to D/C:            Co-evaluation              AM-PAC OT "6 Clicks" Daily Activity     Outcome Measure Help from another person eating meals?: None Help from another person taking care of personal grooming?: None Help from another person toileting, which includes using toliet, bedpan, or urinal?: None Help from another person bathing (including washing, rinsing, drying)?: None Help from another person to put on and taking off regular upper body clothing?: None Help from another person to put on and taking off regular lower body clothing?: None 6 Click Score: 24   End of Session Equipment Utilized During Treatment: Oxygen Nurse Communication: Mobility status  Activity Tolerance: Patient tolerated treatment well Patient left: in chair;with call bell/phone within reach;Other (comment) (up with PT)  OT Visit Diagnosis: Unsteadiness on feet (R26.81)                Time: 0981-1914 OT Time Calculation (min): 15 min Charges:  OT General Charges $OT Visit: 1 Visit OT Evaluation $OT Eval Moderate Complexity: 1 Mod  Jefferey Pica, OTR/L Acute Rehabilitation Services Pager: [phone] Office: [phone]  Denham Mose C 12/13/2019, 11:44 AM

## 2019-12-18 NOTE — Assessment & Plan Note (Signed)
Emphasis on not smoking.  Plan- discussed meds. Encourage Covid vaccination.

## 2019-12-18 NOTE — Assessment & Plan Note (Signed)
Managed by Dr Rosen/ ENT, and by Marni Griffon, NP at [facility]. He has changed his own gauze a few times- does better with help.

## 2019-12-18 NOTE — Assessment & Plan Note (Signed)
His long term survival is really contingent on his ability to keep weight down. He has blamed Covid restriction for lack of walking exercise.

## 2019-12-27 ENCOUNTER — Other Ambulatory Visit: Payer: Self-pay

## 2019-12-27 DIAGNOSIS — I824Y9 Acute embolism and thrombosis of unspecified deep veins of unspecified proximal lower extremity: Secondary | ICD-10-CM

## 2020-01-05 ENCOUNTER — Encounter (HOSPITAL_COMMUNITY): Payer: Medicaid Other

## 2020-01-05 ENCOUNTER — Ambulatory Visit: Payer: Medicaid Other

## 2020-01-11 ENCOUNTER — Encounter: Payer: Self-pay | Admitting: Nurse Practitioner

## 2020-01-11 ENCOUNTER — Ambulatory Visit (INDEPENDENT_AMBULATORY_CARE_PROVIDER_SITE_OTHER): Payer: Medicaid Other | Admitting: Nurse Practitioner

## 2020-01-11 ENCOUNTER — Other Ambulatory Visit: Payer: Self-pay

## 2020-01-11 VITALS — BP 138/66 | HR 64 | Temp 97.2°F | Resp 22 | Ht 68.0 in | Wt 349.0 lb

## 2020-01-11 DIAGNOSIS — I10 Essential (primary) hypertension: Secondary | ICD-10-CM | POA: Diagnosis not present

## 2020-01-11 DIAGNOSIS — E785 Hyperlipidemia, unspecified: Secondary | ICD-10-CM

## 2020-01-11 DIAGNOSIS — Z930 Tracheostomy status: Secondary | ICD-10-CM | POA: Diagnosis not present

## 2020-01-11 DIAGNOSIS — E1100 Type 2 diabetes mellitus with hyperosmolarity without nonketotic hyperglycemic-hyperosmolar coma (NKHHC): Secondary | ICD-10-CM

## 2020-01-11 LAB — POCT GLYCOSYLATED HEMOGLOBIN (HGB A1C): Hemoglobin A1C: 7.1 % — AB (ref 4.0–5.6)

## 2020-01-11 LAB — POCT URINALYSIS DIP (CLINITEK)
Bilirubin, UA: NEGATIVE
Glucose, UA: NEGATIVE mg/dL
Ketones, POC UA: NEGATIVE mg/dL
Leukocytes, UA: NEGATIVE
Nitrite, UA: NEGATIVE
POC PROTEIN,UA: NEGATIVE
Spec Grav, UA: 1.025 (ref 1.010–1.025)
Urobilinogen, UA: 0.2 E.U./dL
pH, UA: 5.5 (ref 5.0–8.0)

## 2020-01-11 LAB — POCT CBG (FASTING - GLUCOSE)-MANUAL ENTRY: Glucose Fasting, POC: 132 mg/dL — AB (ref 70–99)

## 2020-01-11 NOTE — Progress Notes (Signed)
[facility] Patient [facility] [address] [place], [place]  [address] Phone:  [phone]   Fax:  [phone]   Established Patient Office Visit  Subjective:  Patient ID: Timothy Le, male    DOB: 1960/01/31  Age: 59 y.o. MRN: [id number]  CC:  Chief Complaint  Patient presents with   Follow-up    HPI Timothy Le presents for follow up. He  has a past medical history of Asthma, Chronic respiratory failure with hypoxia (HCC) (07/09/2017), COPD (chronic obstructive pulmonary disease) (HCC), Diabetes mellitus without complication (HCC), Difficult intubation, Hypertension, Shortness of breath dyspnea, and Sleep apnea.   Diabetes Mellitus Patient presents for follow up of diabetes. Current symptoms include: hyperglycemia and visual disturbances. Symptoms have stabilized. Patient denies foot ulcerations, hypoglycemia , nausea, polydipsia, polyuria, vomiting and weight loss. He has gained approximately 20 pounds in the last few months. He was diagnosed and treated for DVT on 12/04/2019. He admits that he was having swelling in his left leg cramps going up into the groin and that was very painful. Evaluation to date has included: fasting blood sugar, fasting lipid panel and hemoglobin A1C.  Home sugars: BGs consistently in an acceptable range. Current treatment: Continued insulin which has been somewhat effective and Continued statin which has been unable to assess effectiveness.  Current A1c 7.1% up from 6.5% last dilated eye exam: Unknown.  He is aware that the weight gain is not the goal.  He blames it on winter weather.     Past Medical History:  Diagnosis Date   Asthma    Chronic respiratory failure with hypoxia (HCC) 07/09/2017   COPD (chronic obstructive pulmonary disease) (HCC)    Diabetes mellitus without complication (HCC)    Difficult intubation    Hypertension    Shortness of breath dyspnea    Sleep apnea     Past Surgical History:  Procedure  Laterality Date   INTRAVASCULAR ULTRASOUND/IVUS Left 12/11/2019   Procedure: Intravascular Ultrasound/IVUS;  Surgeon: Maeola Harman, MD;  Location: [facility] INVASIVE CV LAB;  Service: Cardiovascular;  Laterality: Left;  Left lower extremity venous   IR GASTROSTOMY TUBE MOD SED  07/05/2017   PERIPHERAL VASCULAR THROMBECTOMY Left 12/11/2019   Procedure: PERIPHERAL VASCULAR THROMBECTOMY;  Surgeon: Maeola Harman, MD;  Location: [facility] INVASIVE CV LAB;  Service: Cardiovascular;  Laterality: Left;   THYROIDECTOMY Left 08/30/2019   Procedure: THYROIDECTOMY CERVICAL LIPECTOMY;  Surgeon: Serena Colonel, MD;  Location: [facility] OR;  Service: ENT;  Laterality: Left;  NEEDS RNFA PLEASE   TRACHEOSTOMY TUBE PLACEMENT N/A 06/17/2017   Procedure: TRACHEOSTOMY;  Surgeon: Newman Pies, MD;  Location: [facility] OR;  Service: ENT;  Laterality: N/A;   TRACHEOSTOMY TUBE PLACEMENT N/A 08/14/2019   Procedure: TRACHEOSTOMY;  Surgeon: Serena Colonel, MD;  Location: [facility] OR;  Service: ENT;  Laterality: N/A;   TRACHEOSTOMY TUBE PLACEMENT N/A 08/30/2019   Procedure: TRACHEOSTOMY  REVISION;  Surgeon: Serena Colonel, MD;  Location: [facility] OR;  Service: ENT;  Laterality: N/A;    Family History  Problem Relation Age of Onset   Asthma Sister    Asthma Other        nephew   Hypertension Sister    Diabetes type II Sister     Social History   Socioeconomic History   Marital status: Legally Separated    Spouse name: Not on file   Number of children: 1   Years of education: Not on file   Highest education level: Not on file  Occupational History   Occupation: unemployed    Comment: working on [employer]  Tobacco Use   Smoking status: Former Smoker    Packs/day: 0.50    Years: 0.50    Pack years: 0.25    Types: Cigarettes    Start date: 07/28/1983    Quit date: 02/27/2013    Years since quitting: 6.8   Smokeless tobacco: Never Used  Vaping Use   Vaping Use: Never used  Substance and Sexual Activity   Alcohol  use: No    Comment: quit ETOH about 5 months ago-beer and liquor   Drug use: No    Comment: quit 6 months-"pot"-smoked couple joints a week   Sexual activity: Not Currently  Other Topics Concern   Not on file  Social History Narrative   Not on file   Social Determinants of Health   Financial Resource Strain: Not on file  Food Insecurity: Not on file  Transportation Needs: Not on file  Physical Activity: Not on file  Stress: Not on file  Social Connections: Not on file  Intimate Partner Violence: Not on file    Outpatient Medications Prior to Visit  Medication Sig Dispense Refill   acetaminophen (TYLENOL) 325 MG tablet Take 1-2 tablets (325-650 mg total) by mouth every 4 (four) hours as needed for mild pain.     allopurinol (ZYLOPRIM) 100 MG tablet Take 1 tablet (100 mg total) by mouth daily. 90 tablet 3   apixaban (ELIQUIS) 5 MG TABS tablet Take 1 tablet (5 mg total) by mouth 2 (two) times daily. 180 tablet 0   atorvastatin (LIPITOR) 10 MG tablet Take 1 tablet (10 mg total) by mouth daily. 90 tablet 3   diclofenac Sodium (VOLTAREN) 1 % GEL Apply 2 g topically 4 (four) times daily. 150 g 5   gabapentin (NEURONTIN) 100 MG capsule Take 2 capsules (200 mg total) by mouth 3 (three) times daily. 540 capsule 3   insulin detemir (LEVEMIR) 100 UNIT/ML injection Inject 0.33 mLs (33 Units total) into the skin every 12 (twelve) hours. 60 mL 0   ipratropium-albuterol (DUONEB) 0.5-2.5 (3) MG/3ML SOLN Take 3 mLs by nebulization every 6 (six) hours as needed. 360 mL    metoprolol tartrate (LOPRESSOR) 25 MG tablet Take 0.5 tablets (12.5 mg total) by mouth 2 (two) times daily. 90 tablet 3   mometasone-formoterol (DULERA) 100-5 MCG/ACT AERO Inhale 2 puffs into the lungs 2 (two) times daily. 1 each 0   Multiple Vitamin (MULTIVITAMIN WITH MINERALS) TABS tablet Take 1 tablet by mouth daily.     potassium chloride SA (KLOR-CON) 20 MEQ tablet Take 1 tablet (20 mEq total) by mouth 2 (two)  times daily. 180 tablet 3   QUEtiapine (SEROQUEL) 100 MG tablet Take 1 tablet (100 mg total) by mouth 2 (two) times daily. 180 tablet 3   sertraline (ZOLOFT) 100 MG tablet Take 1 tablet (100 mg total) by mouth daily. 90 tablet 3   torsemide (DEMADEX) 10 MG tablet Take 1 tablet (10 mg total) by mouth daily. 90 tablet 3   traZODone (DESYREL) 50 MG tablet Take 0.5-1 tablets (25-50 mg total) by mouth at bedtime as needed for sleep. 15 tablet 11   alum & mag hydroxide-simeth (MAALOX/MYLANTA) 200-200-20 MG/5ML suspension Take 30 mLs by mouth every 4 (four) hours as needed for indigestion, heartburn or flatulence. (Patient not taking: No sig reported) 355 mL 0   nutrition supplement, JUVEN, (JUVEN) PACK Take 1 packet by mouth 2 (two) times daily between meals. (Patient  not taking: No sig reported) 60 packet 0   Oxycodone HCl 10 MG TABS Take 0.5 tablets (5 mg total) by mouth daily as needed (severe pain). (Patient not taking: No sig reported) 7 tablet 0   pantoprazole (PROTONIX) 40 MG tablet Take 1 tablet (40 mg total) by mouth at bedtime. (Patient not taking: No sig reported) 90 tablet 3   senna-docusate (SENOKOT-S) 8.6-50 MG tablet Take 2 tablets by mouth 2 (two) times daily. (Patient not taking: No sig reported)     No facility-administered medications prior to visit.    No Known Allergies  ROS Review of Systems  Respiratory: Positive for shortness of breath.        Trach patent    Cardiovascular: Positive for leg swelling (Hx Of DVT in Nov.).  Musculoskeletal:       Left leg pain       Objective:    Physical Exam Constitutional:      Appearance: He is obese.  HENT:     Head: Normocephalic and atraumatic.  Cardiovascular:     Rate and Rhythm: Normal rate and regular rhythm.     Heart sounds: Normal heart sounds.  Pulmonary:     Effort: Pulmonary effort is normal.     Breath sounds: Normal breath sounds.     Comments: Trach patent Cough Abdominal:     Palpations:  Abdomen is soft.  Musculoskeletal:        General: Normal range of motion.     Right lower leg: No edema.     Left lower leg: Edema (Slightly larger than left no pitting edema) present.  Skin:    General: Skin is warm.     Capillary Refill: Capillary refill takes less than 2 seconds.  Neurological:     General: No focal deficit present.     Mental Status: He is alert and oriented to person, place, and time.  Psychiatric:        Mood and Affect: Mood normal.        Behavior: Behavior normal.        Thought Content: Thought content normal.        Judgment: Judgment normal.     BP 138/66 (BP Location: Left Arm, Patient Position: Sitting, Cuff Size: Large)    Pulse 64    Temp (!) 97.2 F (36.2 C) (Temporal)    Resp (!) 22    Ht 5\' 8"  (1.727 m)    Wt (!) 349 lb (158.3 kg)    SpO2 98%    BMI 53.07 kg/m  Wt Readings from Last 3 Encounters:  01/11/20 (!) 349 lb (158.3 kg)  12/13/19 (!) 329 lb 12.9 oz (149.6 kg)  11/24/19 (!) 333 lb 3.2 oz (151.1 kg)     There are no preventive care reminders to display for this patient.  There are no preventive care reminders to display for this patient.  Lab Results  Component Value Date   TSH 0.434 08/07/2019   Lab Results  Component Value Date   WBC 6.0 12/13/2019   HGB 10.8 (L) 12/13/2019   HCT 35.1 (L) 12/13/2019   MCV 85.2 12/13/2019   PLT 186 12/13/2019   Lab Results  Component Value Date   NA 139 12/13/2019   K 3.8 12/13/2019   CO2 28 12/13/2019   GLUCOSE 147 (H) 12/13/2019   BUN 17 12/13/2019   CREATININE 1.13 12/13/2019   BILITOT 0.3 10/12/2019   ALKPHOS 118 10/12/2019   AST 30 10/12/2019  ALT 34 09/16/2019   PROT 7.5 10/12/2019   ALBUMIN 4.2 10/12/2019   CALCIUM 8.9 12/13/2019   ANIONGAP 12 12/13/2019   GFR 85.97 01/22/2016   Lab Results  Component Value Date   CHOL 159 10/12/2019   Lab Results  Component Value Date   HDL 45 10/12/2019   Lab Results  Component Value Date   LDLCALC 94 10/12/2019   Lab  Results  Component Value Date   TRIG 108 10/12/2019   Lab Results  Component Value Date   CHOLHDL 3.5 10/12/2019   Lab Results  Component Value Date   HGBA1C 7.1 (A) 01/11/2020      Assessment & Plan:   Problem List Items Addressed This Visit      Cardiovascular and Mediastinum   Benign essential HTN We will consider low-dose ACE or ARB at next visit Encouraged home monitoring and recording BP <130/80 Eating a heart-healthy diet with less salt Encouraged regular physical activity  Recommend Weight loss     Endocrine   Type 2 diabetes mellitus with hyperosmolar nonketotic hyperglycemia ([place]) - Primary Encourage compliance with current treatment regimen .  Discussed weight gain with patient how important it is to lose weight with diabetes. Encourage regular CBG monitoring Encourage contacting office if excessive hyperglycemia and or hypoglycemia Lifestyle modification with healthy diet (fewer calories, more high fiber foods, whole grains and non-starchy vegetables, lower fat meat and fish, low-fat diary include healthy oils) regular exercise (physical activity) and weight loss Opthalmology exam discussed  Nutritional consult recommended Regular dental visits encouraged Home BP monitoring also encouraged goal <130/80     Relevant Orders   HgB A1c (Completed)   POCT URINALYSIS DIP (CLINITEK) (Completed)   Glucose (CBG), Fasting (Completed)   Ambulatory referral to Ophthalmology     Other   Hyperlipidemia Continue with current regimen will repeat lipid panel at next visit   Tracheostomy dependence ([place])      No orders of the defined types were placed in this encounter.   Follow-up: Return in about 3 months (around 04/10/2020).    Vevelyn Francois, NP

## 2020-01-11 NOTE — Patient Instructions (Signed)

## 2020-01-13 ENCOUNTER — Other Ambulatory Visit: Payer: Self-pay | Admitting: Physical Medicine and Rehabilitation

## 2020-01-17 ENCOUNTER — Ambulatory Visit (INDEPENDENT_AMBULATORY_CARE_PROVIDER_SITE_OTHER): Payer: Medicaid Other | Admitting: Physician Assistant

## 2020-01-17 ENCOUNTER — Emergency Department (HOSPITAL_COMMUNITY)
Admission: EM | Admit: 2020-01-17 | Discharge: 2020-01-17 | Disposition: A | Discharge status: Home / Self Care | Payer: Medicaid Other | Attending: Emergency Medicine | Admitting: Emergency Medicine

## 2020-01-17 ENCOUNTER — Other Ambulatory Visit: Payer: Self-pay

## 2020-01-17 ENCOUNTER — Ambulatory Visit (HOSPITAL_COMMUNITY)
Admission: RE | Admit: 2020-01-17 | Discharge: 2020-01-17 | Disposition: A | Discharge status: Home / Self Care | Payer: Medicaid Other | Source: Ambulatory Visit | Attending: Vascular Surgery | Admitting: Vascular Surgery

## 2020-01-17 VITALS — BP 134/72 | HR 66 | Temp 98.5°F | Resp 20 | Ht 68.0 in | Wt 346.7 lb

## 2020-01-17 DIAGNOSIS — J9503 Malfunction of tracheostomy stoma: Secondary | ICD-10-CM | POA: Diagnosis not present

## 2020-01-17 DIAGNOSIS — Z7901 Long term (current) use of anticoagulants: Secondary | ICD-10-CM | POA: Insufficient documentation

## 2020-01-17 DIAGNOSIS — Z87891 Personal history of nicotine dependence: Secondary | ICD-10-CM | POA: Diagnosis not present

## 2020-01-17 DIAGNOSIS — J9500 Unspecified tracheostomy complication: Secondary | ICD-10-CM

## 2020-01-17 DIAGNOSIS — Z794 Long term (current) use of insulin: Secondary | ICD-10-CM | POA: Diagnosis not present

## 2020-01-17 DIAGNOSIS — E1100 Type 2 diabetes mellitus with hyperosmolarity without nonketotic hyperglycemic-hyperosmolar coma (NKHHC): Secondary | ICD-10-CM | POA: Diagnosis not present

## 2020-01-17 DIAGNOSIS — Z7951 Long term (current) use of inhaled steroids: Secondary | ICD-10-CM | POA: Diagnosis not present

## 2020-01-17 DIAGNOSIS — I824Y9 Acute embolism and thrombosis of unspecified deep veins of unspecified proximal lower extremity: Secondary | ICD-10-CM

## 2020-01-17 DIAGNOSIS — I5032 Chronic diastolic (congestive) heart failure: Secondary | ICD-10-CM | POA: Insufficient documentation

## 2020-01-17 DIAGNOSIS — J449 Chronic obstructive pulmonary disease, unspecified: Secondary | ICD-10-CM | POA: Insufficient documentation

## 2020-01-17 DIAGNOSIS — J45909 Unspecified asthma, uncomplicated: Secondary | ICD-10-CM | POA: Insufficient documentation

## 2020-01-17 DIAGNOSIS — I110 Hypertensive heart disease with heart failure: Secondary | ICD-10-CM | POA: Insufficient documentation

## 2020-01-17 MED ORDER — LIDOCAINE HCL (PF) 1 % IJ SOLN
INTRAMUSCULAR | Status: AC
  Filled 2020-01-17: qty 5

## 2020-01-17 MED ORDER — LIDOCAINE HCL (CARDIAC) PF 100 MG/5ML IV SOSY
PREFILLED_SYRINGE | INTRAVENOUS | Status: AC
  Filled 2020-01-17: qty 5

## 2020-01-17 NOTE — Consult Note (Signed)
ENT CONSULT:  Reason for Consult: Dislodged tracheostomy tube Referring Physician: EDP  Timothy Le is an 59 y.o. male.  HPI: Patient presents to the [facility] emergency department after accidentally dislodging his tracheostomy tube while cleaning.  He has a very complex airway history and is undergone multiple previous tracheostomies, last procedure performed by Dr. Pollyann Kennedy in July 2021.  Patient subsequently underwent a left thyroid lobectomy for tracheal compression.  The patient has been stable with a Shiley 5 XLT tracheostomy tube in place which has been changed through the [facility] tracheostomy clinic.  Last changed 1 month ago.  Patient has been stable, using a Passy-Muir valve during the day and open tracheostomy with humidified room air at night.  Unfortunately, the patient did not have replacement trach supplies and his previous indwelling Shiley 5 XLT tracheostomy tube was thrown away.  Patient in no acute airway distress.    Past Medical History:  Diagnosis Date  . Asthma   . Chronic respiratory failure with hypoxia (HCC) 07/09/2017  . COPD (chronic obstructive pulmonary disease) (HCC)   . Diabetes mellitus without complication (HCC)   . Difficult intubation   . Hypertension   . Shortness of breath dyspnea   . Sleep apnea     Past Surgical History:  Procedure Laterality Date  . INTRAVASCULAR ULTRASOUND/IVUS Left 12/11/2019   Procedure: Intravascular Ultrasound/IVUS;  Surgeon: Maeola Harman, MD;  Location: [facility] INVASIVE CV LAB;  Service: Cardiovascular;  Laterality: Left;  Left lower extremity venous  . IR GASTROSTOMY TUBE MOD SED  07/05/2017  . PERIPHERAL VASCULAR THROMBECTOMY Left 12/11/2019   Procedure: PERIPHERAL VASCULAR THROMBECTOMY;  Surgeon: Maeola Harman, MD;  Location: [facility] INVASIVE CV LAB;  Service: Cardiovascular;  Laterality: Left;  . THYROIDECTOMY Left 08/30/2019   Procedure: THYROIDECTOMY CERVICAL LIPECTOMY;  Surgeon:  Serena Colonel, MD;  Location: [facility] OR;  Service: ENT;  Laterality: Left;  NEEDS RNFA PLEASE  . TRACHEOSTOMY TUBE PLACEMENT N/A 06/17/2017   Procedure: TRACHEOSTOMY;  Surgeon: Newman Pies, MD;  Location: [facility] OR;  Service: ENT;  Laterality: N/A;  . TRACHEOSTOMY TUBE PLACEMENT N/A 08/14/2019   Procedure: TRACHEOSTOMY;  Surgeon: Serena Colonel, MD;  Location: [facility] OR;  Service: ENT;  Laterality: N/A;  . TRACHEOSTOMY TUBE PLACEMENT N/A 08/30/2019   Procedure: TRACHEOSTOMY  REVISION;  Surgeon: Serena Colonel, MD;  Location: [facility] OR;  Service: ENT;  Laterality: N/A;    Family History  Problem Relation Age of Onset  . Asthma Sister   . Asthma Other        nephew  . Hypertension Sister   . Diabetes type II Sister     Social History:  reports that he quit smoking about 6 years ago. His smoking use included cigarettes. He started smoking about 36 years ago. He has a 0.25 pack-year smoking history. He has never used smokeless tobacco. He reports that he does not drink alcohol and does not use drugs.  Allergies: No Known Allergies  Medications: I have reviewed the patient's current medications.  No results found for this or any previous visit (from the past 48 hour(s)).  VAS [place] IVC/ILIAC (VENOUS ONLY)  Result Date: 01/17/2020 IVC/ILIAC STUDY Indications: Surgery date 12/11/2019. Risk Factors: Hypertension, hyperlipidemia, Diabetes, past history of smoking. Vascular Interventions: Mechanical thrombectomy of IVC, Left common and external                         iliac veins, common femoral, and superficial femoral  veins. Left common iliac vein stent (16 x 90 mm). Limitations: Obesity and air/bowel gas.  Performing Technologist: Hardie Lora RVT  Examination Guidelines: A complete evaluation includes B-mode imaging, spectral Doppler, color Doppler, and power Doppler as needed of all accessible portions of each vessel. Bilateral testing is considered an integral part of a complete examination.  Limited examinations for reoccurring indications may be performed as noted.  IVC/Iliac Findings: +----------+------+--------+--------+    IVC    PatentThrombusComments +----------+------+--------+--------+ IVC Prox  patent                 +----------+------+--------+--------+ IVC Distalpatent                 +----------+------+--------+--------+  +-----------------+---------+-----------+---------+-----------+--------+        CIV       RT-PatentRT-ThrombusLT-PatentLT-ThrombusComments +-----------------+---------+-----------+---------+-----------+--------+ Common Iliac Prox patent              patent                      +-----------------+---------+-----------+---------+-----------+--------+ Could not directly image the stent, however flow is noted in the left common iliac vein and external iliac vein with respiratory variation. +-------------------------+---------+-----------+---------+-----------+--------+            EIV           RT-PatentRT-ThrombusLT-PatentLT-ThrombusComments +-------------------------+---------+-----------+---------+-----------+--------+ External Iliac Vein Mid   patent              patent                      +-------------------------+---------+-----------+---------+-----------+--------+ External Iliac Vein       patent              patent                      Distal                                                                    +-------------------------+---------+-----------+---------+-----------+--------+   Left Stent(s): +-----------------+--------+-----------+--------+--------+ common iliac veinPSV cm/sStenosis   WaveformComments +-----------------+--------+-----------+--------+--------+ Mid Stent                no stenosis        patent   +-----------------+--------+-----------+--------+--------+     Summary: IVC/Iliac: No evidence of thrombus in IVC and Iliac veins. Findings suggest patent left common iliac vein  stent with no evidence of restenosis.  *See table(s) above for measurements and observations.  Electronically signed by Fabienne Bruns MD on 01/17/2020 at 10:52:19 AM.    Final     ROS:ROS  Blood pressure (!) 168/85, pulse 66, temperature 99.5 F (37.5 C), temperature source Oral, resp. rate 20, height 5\' 8"  (1.727 m), weight (!) 157 kg, SpO2 100 %.  PHYSICAL EXAM: General appearance - alert, well appearing, and in no distress, obese  Neck - supple, no significant adenopathy, minimal granulation tissue around the inferior aspect of the patient's tracheostomy site.  Site patent but narrow.   Procedure Note: Tracheostomy tube change  Risks/benefits and possible complications were discussed in detail. Patient understands and agrees to proceed with procedure.   Procedure: With respiratory therapy and nursing staff at the bedside the patient's tracheostomy site was carefully examined, after previous tracheostomy procedures  his soft tissue between the skin and the tracheostomy was relatively flat.  I opted to place a standard #4 Shiley uncuffed tracheostomy tube (patient's previous Shiley 5 XLT tracheostomy tube was not available in the hospital).  The tracheostomy site was dilated using the obturator and a suction catheter.  With a moderate amount of force the tracheostomy tube was inserted and the patient's airway was stable, minimal bleeding.  The patient tolerated the procedure with some difficulty and moderate coughing, no airway compromise.  Patient tolerated procedure without complication or difficulty.  Early Chars. Wilburn Cornelia, M.D. [facility]  Studies Reviewed: None  Assessment/Plan: Given the patient's recent history and findings I was able to place a smaller #4 Shiley uncuffed tracheostomy tube in the patient's airway with good airway exchange.  This may be a good opportunity to begin decannulation - proceeding to a capped tracheostomy tube and ultimate tube removal.  The patient  will contact the [facility]/ trach clinic for new tracheostomy supplies and Passy-Muir valve.  Plan follow-up through their clinic for potential decannulation.  Please contact our office - Dr. Constance Holster for any additional concerns.  Jerrell Belfast 01/17/2020, 8:22 PM

## 2020-01-17 NOTE — Discharge Instructions (Addendum)
Follow up with your ENT doctor's office next week.  Be sure to call your tracheostomy company like we talked about and ask for a replacement tube - size 4 tube, cuffless.  If you ever lose your tube again, try to bring in the tube or a replacement to the ER with you.

## 2020-01-17 NOTE — ED Provider Notes (Addendum)
[facility] EMERGENCY DEPARTMENT Provider Note   CSN: [id number] Arrival date & time: 01/17/20  1424     History CC:  Tracheostomy tube dislodged  Timothy Le is a 59 y.o. male w/ hx of morbid obesity, chronic resp failure, s/p chronic trach (#5 extra long, last exchanged by Dr Constance Holster in October 2021, presenting to ED with dislodged tracheostomy tube.  He accidentally pulled out the tube during a dressing change today.  He denies SOB, and feels he's breathing comfortably on room air.    Per record review, when this last happened 3 months ago in October there were multiple failed attempts by ED team to place new trach, and Dr. Constance Holster was able to due this with dilation.   HPI     Past Medical History:  Diagnosis Date  . Asthma   . Chronic respiratory failure with hypoxia ([place]) 07/09/2017  . COPD (chronic obstructive pulmonary disease) ([place])   . Diabetes mellitus without complication ([place])   . Difficult intubation   . Hypertension   . Shortness of breath dyspnea   . Sleep apnea     Patient Active Problem List   Diagnosis Date Noted  . Acute deep vein thrombosis (DVT) of left lower extremity ([place]) 12/04/2019  . Depression with anxiety 12/04/2019  . Pain due to onychomycosis of toenails of both feet 09/27/2019  . Tracheobronchitis 09/26/2019  . UTI (urinary tract infection) 09/26/2019  . Debility 09/15/2019  . Chronic diastolic CHF (congestive heart failure) ([place]) 09/07/2019  . Poorly controlled diabetes mellitus ([place]) 09/06/2019  . Acute airway obstruction   . Endotracheal tube present   . Acute on chronic respiratory failure ([place]) 07/30/2019  . Acute respiratory failure ([place]) 07/30/2019  . Chronic respiratory failure with hypoxia and hypercapnia (HCC)   . Nodule of soft tissue 01/24/2019  . Benign essential HTN   . Hyperlipidemia   . Tracheostomy dependence ([place])   . Physical deconditioning 07/15/2017  . Chronic respiratory failure with hypoxia  ([place]) 07/09/2017  . Acute pulmonary edema (HCC)   . Decubitus ulcer 06/19/2017  . Difficult airway for intubation   . Hypoventilation associated with obesity syndrome ([place]) 06/09/2017  . Seasonal and perennial allergic rhinitis 05/24/2016  . AKI (acute kidney injury) ([place]) 04/08/2015  . Hyperosmolar hyperglycemic state (HHS) ([place]) 03/28/2015  . Type 2 diabetes mellitus with hyperosmolar nonketotic hyperglycemia ([place]) 03/28/2015  . Acute on chronic respiratory failure with hypoxemia ([place]) 03/27/2015  . COPD mixed type ([place]) 12/27/2014  . Severe obesity (BMI >= 40) ([place]) 12/27/2014  . Diabetes mellitus type 2, controlled ([place]) 12/27/2014    Class: Chronic  . OSA (obstructive sleep apnea) 07/29/2013  . Peripheral edema 07/29/2013    Past Surgical History:  Procedure Laterality Date  . INTRAVASCULAR ULTRASOUND/IVUS Left 12/11/2019   Procedure: Intravascular Ultrasound/IVUS;  Surgeon: Waynetta Sandy, MD;  Location: [place] [facility];  Service: Cardiovascular;  Laterality: Left;  Left lower extremity venous  . IR GASTROSTOMY TUBE MOD SED  07/05/2017  . PERIPHERAL VASCULAR THROMBECTOMY Left 12/11/2019   Procedure: PERIPHERAL VASCULAR THROMBECTOMY;  Surgeon: Waynetta Sandy, MD;  Location: [place] [facility];  Service: Cardiovascular;  Laterality: Left;  . THYROIDECTOMY Left 08/30/2019   Procedure: THYROIDECTOMY CERVICAL LIPECTOMY;  Surgeon: Izora Gala, MD;  Location: [place];  Service: ENT;  Laterality: Left;  NEEDS RNFA PLEASE  . TRACHEOSTOMY TUBE PLACEMENT N/A 06/17/2017   Procedure: TRACHEOSTOMY;  Surgeon: Leta Baptist, MD;  Location: [place];  Service: ENT;  Laterality: N/A;  . TRACHEOSTOMY TUBE PLACEMENT N/A 08/14/2019   Procedure: TRACHEOSTOMY;  Surgeon: Serena Colonel, MD;  Location: [facility] OR;  Service: ENT;  Laterality: N/A;  . TRACHEOSTOMY TUBE PLACEMENT N/A 08/30/2019   Procedure: TRACHEOSTOMY  REVISION;  Surgeon: Serena Colonel, MD;  Location: [facility] OR;  Service: ENT;   Laterality: N/A;       Family History  Problem Relation Age of Onset  . Asthma Sister   . Asthma Other        nephew  . Hypertension Sister   . Diabetes type II Sister     Social History   Tobacco Use  . Smoking status: Former Smoker    Packs/day: 0.50    Years: 0.50    Pack years: 0.25    Types: Cigarettes    Start date: 07/28/1983    Quit date: 02/27/2013    Years since quitting: 6.8  . Smokeless tobacco: Never Used  Vaping Use  . Vaping Use: Never used  Substance Use Topics  . Alcohol use: No    Comment: quit ETOH about 5 months ago-beer and liquor  . Drug use: No    Comment: quit 6 months-"pot"-smoked couple joints a week    Home Medications Prior to Admission medications   Medication Sig Start Date End Date Taking? Authorizing Provider  acetaminophen (TYLENOL) 325 MG tablet Take 1-2 tablets (325-650 mg total) by mouth every 4 (four) hours as needed for mild pain. 09/26/19   Love, Evlyn Kanner, PA-C  allopurinol (ZYLOPRIM) 100 MG tablet Take 1 tablet (100 mg total) by mouth daily. 10/12/19 10/11/20  Barbette Merino, NP  alum & mag hydroxide-simeth (MAALOX/MYLANTA) 200-200-20 MG/5ML suspension Take 30 mLs by mouth every 4 (four) hours as needed for indigestion, heartburn or flatulence. Patient not taking: No sig reported 09/15/19   Russella Dar, NP  apixaban (ELIQUIS) 5 MG TABS tablet Take 1 tablet (5 mg total) by mouth 2 (two) times daily. 12/13/19 03/12/20  [place], Alvira Philips, DO  atorvastatin (LIPITOR) 10 MG tablet Take 1 tablet (10 mg total) by mouth daily. 10/12/19 10/11/20  Barbette Merino, NP  diclofenac Sodium (VOLTAREN) 1 % GEL Apply 2 g topically 4 (four) times daily. 10/12/19   Barbette Merino, NP  gabapentin (NEURONTIN) 100 MG capsule Take 2 capsules (200 mg total) by mouth 3 (three) times daily. 10/12/19 10/11/20  Barbette Merino, NP  insulin detemir (LEVEMIR) 100 UNIT/ML injection Inject 0.33 mLs (33 Units total) into the skin every 12 (twelve) hours. 12/13/19 03/13/20   [place], Alvira Philips, DO  ipratropium-albuterol (DUONEB) 0.5-2.5 (3) MG/3ML SOLN Take 3 mLs by nebulization every 6 (six) hours as needed. 10/12/19 10/11/20  Barbette Merino, NP  metoprolol tartrate (LOPRESSOR) 25 MG tablet Take 0.5 tablets (12.5 mg total) by mouth 2 (two) times daily. 10/12/19 10/11/20  Barbette Merino, NP  mometasone-formoterol (DULERA) 100-5 MCG/ACT AERO Inhale 2 puffs into the lungs 2 (two) times daily. 10/12/19   Barbette Merino, NP  Multiple Vitamin (MULTIVITAMIN WITH MINERALS) TABS tablet Take 1 tablet by mouth daily. 09/16/19   Russella Dar, NP  nutrition supplement, JUVEN, (JUVEN) PACK Take 1 packet by mouth 2 (two) times daily between meals. Patient not taking: No sig reported 09/26/19   Love, Evlyn Kanner, PA-C  Oxycodone HCl 10 MG TABS Take 0.5 tablets (5 mg total) by mouth daily as needed (severe pain). Patient not taking: No sig reported 09/26/19   Love, Evlyn Kanner, PA-C  pantoprazole (PROTONIX)  40 MG tablet Take 1 tablet (40 mg total) by mouth at bedtime. Patient not taking: No sig reported 10/12/19 10/11/20  Vevelyn Francois, NP  potassium chloride SA (KLOR-CON) 20 MEQ tablet Take 1 tablet (20 mEq total) by mouth 2 (two) times daily. 10/12/19 10/11/20  Vevelyn Francois, NP  QUEtiapine (SEROQUEL) 100 MG tablet Take 1 tablet (100 mg total) by mouth 2 (two) times daily. 10/12/19 10/11/20  Vevelyn Francois, NP  senna-docusate (SENOKOT-S) 8.6-50 MG tablet Take 2 tablets by mouth 2 (two) times daily. Patient not taking: No sig reported 10/12/19   Vevelyn Francois, NP  sertraline (ZOLOFT) 100 MG tablet Take 1 tablet (100 mg total) by mouth daily. 10/12/19 10/11/20  Vevelyn Francois, NP  torsemide (DEMADEX) 10 MG tablet Take 1 tablet (10 mg total) by mouth daily. 10/12/19 10/11/20  Vevelyn Francois, NP  traZODone (DESYREL) 50 MG tablet Take 0.5-1 tablets (25-50 mg total) by mouth at bedtime as needed for sleep. 10/12/19 10/11/20  Vevelyn Francois, NP    Allergies    Patient has no known allergies.  Review  of Systems   Review of Systems  Physical Exam Updated Vital Signs BP (!) 168/85   Pulse 66   Temp 99.5 F (37.5 C) (Oral)   Resp 20   Ht 5\' 8"  (1.727 m)   Wt (!) 157 kg   SpO2 100%   BMI 52.63 kg/m   Physical Exam  ED Results / Procedures / Treatments   Labs (all labs ordered are listed, but only abnormal results are displayed) Labs Reviewed - No data to display  EKG None  Radiology VAS [place] IVC/ILIAC (VENOUS ONLY)  Result Date: 01/17/2020 IVC/ILIAC STUDY Indications: Surgery date 12/11/2019. Risk Factors: Hypertension, hyperlipidemia, Diabetes, past history of smoking. Vascular Interventions: Mechanical thrombectomy of IVC, Left common and external                         iliac veins, common femoral, and superficial femoral                         veins. Left common iliac vein stent (16 x 90 mm). Limitations: Obesity and air/bowel gas.  Performing Technologist: Delorise Shiner RVT  Examination Guidelines: A complete evaluation includes B-mode imaging, spectral Doppler, color Doppler, and power Doppler as needed of all accessible portions of each vessel. Bilateral testing is considered an integral part of a complete examination. Limited examinations for reoccurring indications may be performed as noted.  IVC/Iliac Findings: +----------+------+--------+--------+    IVC    PatentThrombusComments +----------+------+--------+--------+ IVC Prox  patent                 +----------+------+--------+--------+ IVC Distalpatent                 +----------+------+--------+--------+  +-----------------+---------+-----------+---------+-----------+--------+        CIV       RT-PatentRT-ThrombusLT-PatentLT-ThrombusComments +-----------------+---------+-----------+---------+-----------+--------+ Common Iliac Prox patent              patent                      +-----------------+---------+-----------+---------+-----------+--------+ Could not directly image the stent, however  flow is noted in the left common iliac vein and external iliac vein with respiratory variation. +-------------------------+---------+-----------+---------+-----------+--------+            EIV           RT-PatentRT-ThrombusLT-PatentLT-ThrombusComments +-------------------------+---------+-----------+---------+-----------+--------+ External  Iliac Vein Mid   patent              patent                      +-------------------------+---------+-----------+---------+-----------+--------+ External Iliac Vein       patent              patent                      Distal                                                                    +-------------------------+---------+-----------+---------+-----------+--------+   Left Stent(s): +-----------------+--------+-----------+--------+--------+ common iliac veinPSV cm/sStenosis   WaveformComments +-----------------+--------+-----------+--------+--------+ Mid Stent                no stenosis        patent   +-----------------+--------+-----------+--------+--------+     Summary: IVC/Iliac: No evidence of thrombus in IVC and Iliac veins. Findings suggest patent left common iliac vein stent with no evidence of restenosis.  *See table(s) above for measurements and observations.  Electronically signed by Ruta Hinds MD on 01/17/2020 at 10:52:19 AM.    Final     Procedures Procedures (including critical care time)  Medications Ordered in ED Medications  lidocaine (PF) (XYLOCAINE) 1 % injection (  Not Given 01/17/20 1946)  lidocaine (cardiac) 100 mg/34mL (XYLOCAINE) 100 MG/5ML injection 2% (  Given by Other 01/17/20 2008)    ED Course  I have reviewed the triage vital signs and the nursing notes.  Pertinent labs & imaging results that were available during my care of the patient were reviewed by me and considered in my medical decision making (see chart for details).  59 yo male here with trach tube dislodged accidentally Stoma  is open NO hypoxia, breathing comfortably He is on trach collar by day and night  ENT consulted as noted below, replaced tube at bedside Okay for discharge  A verbal order was given to provide the ENT cart to bedside for the ENT consultant provider.  Clinical Course as of 01/17/20 2157  Wed Jan 17, 2020  1522 3rd phone call to [facility] ENT with no answer [MT]  1529 Spoke to Dr Constance Holster who asks that I contact RT at the hospital as he does not have access to this tracheostomy piece currently. [MT]  [id number] We are searching to see if we have this piece in stock in the hospital [MT]  1627 No appropriate size trach's available in the hospital, however the patient called his family and they have a spare that they are bringing to the hospital now. [MT]  1901 Dr Wilburn Cornelia en route to hospital to attempt trach placement.  Size 6 cuffless trach found in OR and brought to bedside.  Family members arrived but did not have the right original parts - they've thrown away the actual trach tube. [MT]  1940 Dr Wilburn Cornelia at bedside [MT]  2009 Size 4 trach tube placed by ENT Dr Wilburn Cornelia at bedside.  Okay for discharge with office f/u [MT]    Clinical Course User Index [MT] Niambi Smoak, Carola Rhine, MD    Final Clinical Impression(s) / ED Diagnoses Final diagnoses:  Complication of  tracheostomy tube [facility])    Rx / DC Orders ED Discharge Orders    None       Eliza Green, Carola Rhine, MD 01/17/20 2157    Wyvonnia Dusky, MD 02/20/20 [phone]

## 2020-01-17 NOTE — Progress Notes (Signed)
Office Note     CC:  follow up Requesting Provider:  Barbette Merino, NP  HPI: Timothy Le is a 59 y.o. (Nov 07, 1960) male who was seen in consultation during hospitalization for extensive left lower extremity DVT.  He presents today for routine follow-up status post thrombectomy of IVC, left common external leg veins left common femoral vein with Inari Clottriever.  Status post Wallstent of left common iliac vein as well as balloon angioplasty of left common femoral vein.  He was discharged home on Eliquis.  Today, he denies any lower extremity pain or swelling.  He has compression stockings but is not wearing them currently.  He is compliant with Eliquis and statin. History of diabetes mellitus type 2, insulin requiring   Past Medical History:  Diagnosis Date  . Asthma   . Chronic respiratory failure with hypoxia (HCC) 07/09/2017  . COPD (chronic obstructive pulmonary disease) (HCC)   . Diabetes mellitus without complication (HCC)   . Difficult intubation   . Hypertension   . Shortness of breath dyspnea   . Sleep apnea     Past Surgical History:  Procedure Laterality Date  . INTRAVASCULAR ULTRASOUND/IVUS Left 12/11/2019   Procedure: Intravascular Ultrasound/IVUS;  Surgeon: Maeola Harman, MD;  [facility]: [facility] INVASIVE CV LAB;  Service: Cardiovascular;  Laterality: Left;  Left lower extremity venous  . IR GASTROSTOMY TUBE MOD SED  07/05/2017  . PERIPHERAL VASCULAR THROMBECTOMY Left 12/11/2019   Procedure: PERIPHERAL VASCULAR THROMBECTOMY;  Surgeon: Maeola Harman, MD;  Location: [facility] INVASIVE CV LAB;  Service: Cardiovascular;  Laterality: Left;  . THYROIDECTOMY Left 08/30/2019   Procedure: THYROIDECTOMY CERVICAL LIPECTOMY;  Surgeon: Serena Colonel, MD;  Location: [facility] OR;  Service: ENT;  Laterality: Left;  NEEDS RNFA PLEASE  . TRACHEOSTOMY TUBE PLACEMENT N/A 06/17/2017   Procedure: TRACHEOSTOMY;  Surgeon: Newman Pies, MD;  Location: [facility] OR;  Service: ENT;  Laterality:  N/A;  . TRACHEOSTOMY TUBE PLACEMENT N/A 08/14/2019   Procedure: TRACHEOSTOMY;  Surgeon: Serena Colonel, MD;  Location: [facility] OR;  Service: ENT;  Laterality: N/A;  . TRACHEOSTOMY TUBE PLACEMENT N/A 08/30/2019   Procedure: TRACHEOSTOMY  REVISION;  Surgeon: Serena Colonel, MD;  Location: [facility] OR;  Service: ENT;  Laterality: N/A;    Social History   Socioeconomic History  . Marital status: Legally Separated    Spouse name: Not on file  . Number of children: 1  . Years of education: Not on file  . Highest education level: Not on file  Occupational History  . Occupation: unemployed    Comment: working on [employer]  . Smoking status: Former Smoker    Packs/day: 0.50    Years: 0.50    Pack years: 0.25    Types: Cigarettes    Start date: 07/28/1983    Quit date: 02/27/2013    Years since quitting: 6.8  . Smokeless tobacco: Never Used  Vaping Use  . Vaping Use: Never used  Substance and Sexual Activity  . Alcohol use: No    Comment: quit ETOH about 5 months ago-beer and liquor  . Drug use: No    Comment: quit 6 months-"pot"-smoked couple joints a week  . Sexual activity: Not Currently  Other Topics Concern  . Not on file  Social History Narrative  . Not on file   Social Determinants of Health   Financial Resource Strain: Not on file  Food Insecurity: Not on file  Transportation Needs: Not on file  Physical Activity: Not on file  Stress: Not on file  Social Connections: Not on file  Intimate Partner Violence: Not on file   Family History  Problem Relation Age of Onset  . Asthma Sister   . Asthma Other        nephew  . Hypertension Sister   . Diabetes type II Sister     Current Outpatient Medications  Medication Sig Dispense Refill  . acetaminophen (TYLENOL) 325 MG tablet Take 1-2 tablets (325-650 mg total) by mouth every 4 (four) hours as needed for mild pain.    Marland Kitchen allopurinol (ZYLOPRIM) 100 MG tablet Take 1 tablet (100 mg total) by mouth daily. 90 tablet 3  .  apixaban (ELIQUIS) 5 MG TABS tablet Take 1 tablet (5 mg total) by mouth 2 (two) times daily. 180 tablet 0  . atorvastatin (LIPITOR) 10 MG tablet Take 1 tablet (10 mg total) by mouth daily. 90 tablet 3  . diclofenac Sodium (VOLTAREN) 1 % GEL Apply 2 g topically 4 (four) times daily. 150 g 5  . gabapentin (NEURONTIN) 100 MG capsule Take 2 capsules (200 mg total) by mouth 3 (three) times daily. 540 capsule 3  . insulin detemir (LEVEMIR) 100 UNIT/ML injection Inject 0.33 mLs (33 Units total) into the skin every 12 (twelve) hours. 60 mL 0  . ipratropium-albuterol (DUONEB) 0.5-2.5 (3) MG/3ML SOLN Take 3 mLs by nebulization every 6 (six) hours as needed. 360 mL   . metoprolol tartrate (LOPRESSOR) 25 MG tablet Take 0.5 tablets (12.5 mg total) by mouth 2 (two) times daily. 90 tablet 3  . mometasone-formoterol (DULERA) 100-5 MCG/ACT AERO Inhale 2 puffs into the lungs 2 (two) times daily. 1 each 0  . Multiple Vitamin (MULTIVITAMIN WITH MINERALS) TABS tablet Take 1 tablet by mouth daily.    . potassium chloride SA (KLOR-CON) 20 MEQ tablet Take 1 tablet (20 mEq total) by mouth 2 (two) times daily. 180 tablet 3  . QUEtiapine (SEROQUEL) 100 MG tablet Take 1 tablet (100 mg total) by mouth 2 (two) times daily. 180 tablet 3  . sertraline (ZOLOFT) 100 MG tablet Take 1 tablet (100 mg total) by mouth daily. 90 tablet 3  . torsemide (DEMADEX) 10 MG tablet Take 1 tablet (10 mg total) by mouth daily. 90 tablet 3  . traZODone (DESYREL) 50 MG tablet Take 0.5-1 tablets (25-50 mg total) by mouth at bedtime as needed for sleep. 15 tablet 11  . alum & mag hydroxide-simeth (MAALOX/MYLANTA) 200-200-20 MG/5ML suspension Take 30 mLs by mouth every 4 (four) hours as needed for indigestion, heartburn or flatulence. (Patient not taking: No sig reported) 355 mL 0  . nutrition supplement, JUVEN, (JUVEN) PACK Take 1 packet by mouth 2 (two) times daily between meals. (Patient not taking: No sig reported) 60 packet 0  . Oxycodone HCl 10 MG  TABS Take 0.5 tablets (5 mg total) by mouth daily as needed (severe pain). (Patient not taking: No sig reported) 7 tablet 0  . pantoprazole (PROTONIX) 40 MG tablet Take 1 tablet (40 mg total) by mouth at bedtime. (Patient not taking: No sig reported) 90 tablet 3  . senna-docusate (SENOKOT-S) 8.6-50 MG tablet Take 2 tablets by mouth 2 (two) times daily. (Patient not taking: No sig reported)     No current facility-administered medications for this visit.    No Known Allergies   REVIEW OF SYSTEMS:   [X]  denotes positive finding, [ ]  denotes negative finding Cardiac  Comments:  Chest pain or chest pressure:    Shortness of breath upon exertion:  Short of breath when lying flat:    Irregular heart rhythm:        Vascular    Pain in calf, thigh, or hip brought on by ambulation:    Pain in feet at night that wakes you up from your sleep:     Blood clot in your veins:    Leg swelling:         Pulmonary    Oxygen at home:    Productive cough:     Wheezing:         Neurologic    Sudden weakness in arms or legs:     Sudden numbness in arms or legs:     Sudden onset of difficulty speaking or slurred speech:    Temporary loss of vision in one eye:     Problems with dizziness:         Gastrointestinal    Blood in stool:     Vomited blood:         Genitourinary    Burning when urinating:     Blood in urine:        Psychiatric    Major depression:         Hematologic    Bleeding problems:    Problems with blood clotting too easily:        Skin    Rashes or ulcers:        Constitutional    Fever or chills:      PHYSICAL EXAMINATION:  Vitals:   01/17/20 0940  BP: 134/72  Pulse: 66  Resp: 20  Temp: 98.5 F (36.9 C)  TempSrc: Temporal  SpO2: 96%  Weight: (!) 346 lb 11.2 oz (157.3 kg)  Height: 5\' 8"  (1.727 m)    General:  WDWN in NAD; vital signs documented above Gait: Unaided, no ataxia HENT: WNL, normocephalic Pulmonary: mildly-labored breathing  , Cardiac: regular HR,  Skin: without rashes Vascular Exam/Pulses: 2+ left posterior tibial and dorsalis pedis pulses Extremities: without ischemic changes, without Gangrene , without cellulitis; without open wounds; no lower extremity edema Musculoskeletal: no muscle wasting or atrophy  Neurologic: A&O X 3;  No focal weakness or paresthesias are detected Psychiatric:  The pt has Normal affect.   Non-Invasive Vascular Imaging:   01/17/2020  LLE venous duplex:  IVC/Iliac: No evidence of thrombus in IVC and Iliac veins. Findings suggest patent left common iliac vein   ASSESSMENT/PLAN:: 59 y.o. male here for follow up for mechanical thrombectomy and left common iliac vein stent placement secondary to extensive left lower extremity DVT.  The patient has no residual symptoms or clinical findings.  Continue Eliquis.  Encouraged him to wear his compression stocking during the day on the left lower extremity.  Follow-up in 6 months with duplex ultrasound.  Return to clinic sooner should he develop lower extremity pain, swelling or redness.  Barbie Banner, PA-C [facility] [phone]  Clinic MD:   Dr. Oneida Alar

## 2020-01-17 NOTE — Progress Notes (Signed)
RT to bedside for pt with trach that came out. RT contact CCM NP P. Babcock as well as RT Charge to see if we had any #5XT trach available in trach clinic. Kreg Shropshire, NP recommended contacting Dr Pollyann Kennedy with ENT who is following this Pt due to his difficult airway. Pt did not bring his trach in with him so this RT is unable to attempt to put it back in. ED MD made aware and will contact Dr Pollyann Kennedy to see pt and replace #5XT trach. Pt in no distress, no increased WOB, no SOB. 100% on RA.

## 2020-01-17 NOTE — ED Triage Notes (Signed)
Pt BIB [facility] due to accidentally removing tracheostomy tube during dressing change while at home. Pt was not able to self reinsert. EMS observed clot at the site with some bleeding.  VSS w/ EMS w/ O2 sat 95% and above.

## 2020-01-18 ENCOUNTER — Other Ambulatory Visit: Payer: Self-pay

## 2020-01-18 DIAGNOSIS — I824Y9 Acute embolism and thrombosis of unspecified deep veins of unspecified proximal lower extremity: Secondary | ICD-10-CM

## 2020-01-29 NOTE — ED Provider Notes (Signed)
Addendum to 01/17/20 ED Provider note  Review of Systems  Constitutional: Negative for chills and fever.  Respiratory: Negative for cough and shortness of breath.   Cardiovascular: Negative for chest pain and palpitations.  Musculoskeletal: Negative for back pain and neck pain.   Physical Exam Constitutional:      General: He is not in acute distress.    Appearance: He is obese.  HENT:     Head: Normocephalic and atraumatic.  Eyes:     Conjunctiva/sclera: Conjunctivae normal.     Pupils: Pupils are equal, round, and reactive to light.  Neck:     Comments: Stoma site on neck appears patent with no active bleeding Cardiovascular:     Rate and Rhythm: Normal rate.     Pulses: Normal pulses.  Pulmonary:     Effort: Pulmonary effort is normal.     Comments: 98% on room air Speaking in full sentences Abdominal:     General: Abdomen is flat. There is no distension.  Skin:    General: Skin is warm and dry.  Neurological:     General: No focal deficit present.     Mental Status: He is alert. Mental status is at baseline.  Psychiatric:        Mood and Affect: Mood normal.        Behavior: Behavior normal.       Wyvonnia Dusky, MD 01/29/20 [phone]

## 2020-02-11 ENCOUNTER — Other Ambulatory Visit: Payer: Self-pay | Admitting: Physical Medicine and Rehabilitation

## 2020-02-12 ENCOUNTER — Other Ambulatory Visit: Payer: Self-pay | Admitting: Nurse Practitioner

## 2020-04-10 NOTE — Progress Notes (Signed)
[place], [place]  [address] Phone:  [phone]   Fax:  [phone]   Established Patient Office Visit  Subjective:  Patient ID: Timothy Le, male    DOB: 1960-07-02  Age: 60 y.o. MRN: [id number]  CC:  Chief Complaint  Patient presents with  . Follow-up    3 month folllow up , diabetes, stress out  loss , having issus with trach , is smaller , needs a referral ro trach clinic , need a refill on meds ,     Timothy Le presents for follow up. He  has a past medical history of Asthma, Chronic respiratory failure with hypoxia ([place]) (07/09/2017), COPD (chronic obstructive pulmonary disease) ([place]), Diabetes mellitus without complication ([place]), Difficult intubation, Hypertension, Shortness of breath dyspnea, and Sleep apnea.    Diabetes Mellitus Patient presents for follow up of diabetes. Current symptoms include: none. Symptoms have been well-controlled. Patient denies foot ulcerations, hypoglycemia , increased appetite, nausea, polydipsia, polyuria, vomiting and weight loss. Evaluation to date has included: fasting blood sugar, fasting lipid panel, hemoglobin A1C and microalbuminuria.  Home sugars: BGs are running  consistent with Hgb A1C. Current treatment: weight loss of 10 lbs which has been effective, Continued insulin which has been effective and Continued statin which has been effective. Last dilated eye exam: pending apt.  He is concern today about his trach. He is requesting a referral. However, he is an established patient with and the tracheostomy clinic.  He has misplaced the phone number due to his recent move.  He admits that the current trach is too small and is causing him some difficulty.  He needs it replaced.  He admits that he does continue to have depression but feels like his current regimen is effective.  He did have an increased stressor recently with having to move from his apartment.  He was trying to help a  friend. Past Medical History:  Diagnosis Date  . Asthma   . Chronic respiratory failure with hypoxia ([place]) 07/09/2017  . COPD (chronic obstructive pulmonary disease) ([place])   . Diabetes mellitus without complication ([place])   . Difficult intubation   . Hypertension   . Shortness of breath dyspnea   . Sleep apnea     Past Surgical History:  Procedure Laterality Date  . INTRAVASCULAR ULTRASOUND/IVUS Left 12/11/2019   Procedure: Intravascular Ultrasound/IVUS;  Surgeon: Waynetta Sandy, MD;  Location: [facility];  Service: Cardiovascular;  Laterality: Left;  Left lower extremity venous  . IR GASTROSTOMY TUBE MOD SED  07/05/2017  . PERIPHERAL VASCULAR THROMBECTOMY Left 12/11/2019   Procedure: PERIPHERAL VASCULAR THROMBECTOMY;  Surgeon: Waynetta Sandy, MD;  Location: [facility];  Service: Cardiovascular;  Laterality: Left;  . THYROIDECTOMY Left 08/30/2019   Procedure: THYROIDECTOMY CERVICAL LIPECTOMY;  Surgeon: Izora Gala, MD;  Location: [facility];  Service: ENT;  Laterality: Left;  NEEDS RNFA PLEASE  . TRACHEOSTOMY TUBE PLACEMENT N/A 06/17/2017   Procedure: TRACHEOSTOMY;  Surgeon: Leta Baptist, MD;  Location: [facility];  Service: ENT;  Laterality: N/A;  . TRACHEOSTOMY TUBE PLACEMENT N/A 08/14/2019   Procedure: TRACHEOSTOMY;  Surgeon: Izora Gala, MD;  Location: [facility];  Service: ENT;  Laterality: N/A;  . TRACHEOSTOMY TUBE PLACEMENT N/A 08/30/2019   Procedure: TRACHEOSTOMY  REVISION;  Surgeon: Izora Gala, MD;  Location: [facility];  Service: ENT;  Laterality: N/A;    Family History  Problem Relation Age of Onset  . Asthma  Sister   . Asthma Other        nephew  . Hypertension Sister   . Diabetes type II Sister     Social History   Socioeconomic History  . Marital status: Legally Separated    Spouse name: Not on file  . Number of children: 1  . Years of education: Not on file  . Highest education level: Not on file  Occupational History  . Occupation: unemployed     Comment: working on [employer]  . Smoking status: Former Smoker    Packs/day: 0.50    Years: 0.50    Pack years: 0.25    Types: Cigarettes    Start date: 07/28/1983    Quit date: 02/27/2013    Years since quitting: 7.1  . Smokeless tobacco: Never Used  Vaping Use  . Vaping Use: Never used  Substance and Sexual Activity  . Alcohol use: No    Comment: quit ETOH about 5 months ago-beer and liquor  . Drug use: No    Comment: quit 6 months-"pot"-smoked couple joints a week  . Sexual activity: Not Currently  Other Topics Concern  . Not on file  Social History Narrative  . Not on file   Social Determinants of Health   Financial Resource Strain: Not on file  Food Insecurity: Not on file  Transportation Needs: Not on file  Physical Activity: Not on file  Stress: Not on file  Social Connections: Not on file  Intimate Partner Violence: Not on file    Outpatient Medications Prior to Visit  Medication Sig Dispense Refill  . acetaminophen (TYLENOL) 325 MG tablet Take 1-2 tablets (325-650 mg total) by mouth every 4 (four) hours as needed for mild pain.    Marland Kitchen allopurinol (ZYLOPRIM) 100 MG tablet Take 1 tablet (100 mg total) by mouth daily. 90 tablet 3  . atorvastatin (LIPITOR) 10 MG tablet Take 1 tablet (10 mg total) by mouth daily. 90 tablet 3  . diclofenac Sodium (VOLTAREN) 1 % GEL Apply 2 g topically 4 (four) times daily. 150 g 5  . gabapentin (NEURONTIN) 100 MG capsule Take 2 capsules (200 mg total) by mouth 3 (three) times daily. 540 capsule 3  . ipratropium-albuterol (DUONEB) 0.5-2.5 (3) MG/3ML SOLN Take 3 mLs by nebulization every 6 (six) hours as needed. 360 mL   . metoprolol tartrate (LOPRESSOR) 25 MG tablet Take 0.5 tablets (12.5 mg total) by mouth 2 (two) times daily. 90 tablet 3  . potassium chloride SA (KLOR-CON) 20 MEQ tablet Take 1 tablet (20 mEq total) by mouth 2 (two) times daily. 180 tablet 3  . QUEtiapine (SEROQUEL) 100 MG tablet Take 1 tablet (100 mg total)  by mouth 2 (two) times daily. 180 tablet 3  . sertraline (ZOLOFT) 100 MG tablet Take 1 tablet (100 mg total) by mouth daily. 90 tablet 3  . torsemide (DEMADEX) 10 MG tablet Take 1 tablet (10 mg total) by mouth daily. 90 tablet 3  . traZODone (DESYREL) 50 MG tablet TAKE 0.5-1 TABLETS (25-50 MG TOTAL) BY MOUTH AT BEDTIME AS NEEDED FOR SLEEP. 15 tablet 11  . apixaban (ELIQUIS) 5 MG TABS tablet Take 1 tablet (5 mg total) by mouth 2 (two) times daily. 180 tablet 0  . insulin detemir (LEVEMIR) 100 UNIT/ML injection Inject 0.33 mLs (33 Units total) into the skin every 12 (twelve) hours. 60 mL 0  . Multiple Vitamin (MULTIVITAMIN WITH MINERALS) TABS tablet Take 1 tablet by mouth daily.    Marland Kitchen  pantoprazole (PROTONIX) 40 MG tablet Take 1 tablet (40 mg total) by mouth at bedtime. 90 tablet 3  . alum & mag hydroxide-simeth (MAALOX/MYLANTA) 200-200-20 MG/5ML suspension Take 30 mLs by mouth every 4 (four) hours as needed for indigestion, heartburn or flatulence. (Patient not taking: No sig reported) 355 mL 0  . BD INSULIN SYRINGE U/F 31G X 5/16" 0.5 ML MISC USE FOR DIABETIC TESTING    . mometasone-formoterol (DULERA) 100-5 MCG/ACT AERO Inhale 2 puffs into the lungs 2 (two) times daily. 1 each 0  . nutrition supplement, JUVEN, (JUVEN) PACK Take 1 packet by mouth 2 (two) times daily between meals. (Patient not taking: No sig reported) 60 packet 0  . LANTUS 100 UNIT/ML injection SMARTSIG:35 Unit(s) SUB-Q Every Night    . Oxycodone HCl 10 MG TABS Take 0.5 tablets (5 mg total) by mouth daily as needed (severe pain). (Patient not taking: No sig reported) 7 tablet 0  . senna-docusate (SENOKOT-S) 8.6-50 MG tablet Take 2 tablets by mouth 2 (two) times daily. (Patient not taking: No sig reported)     No facility-administered medications prior to visit.    No Known Allergies  ROS Review of Systems    Objective:    Physical Exam Constitutional:      Appearance: He is obese.  HENT:     Head: Normocephalic and  atraumatic.  Cardiovascular:     Rate and Rhythm: Normal rate and regular rhythm.     Pulses: Normal pulses.     Heart sounds: Normal heart sounds.  Pulmonary:     Effort: Pulmonary effort is normal.     Breath sounds: Normal breath sounds.     Comments: trach Musculoskeletal:     Cervical back: Normal range of motion.     Right lower leg: No edema.     Left lower leg: No edema.  Skin:    General: Skin is warm and dry.     Capillary Refill: Capillary refill takes less than 2 seconds.  Neurological:     General: No focal deficit present.     Mental Status: He is alert and oriented to person, place, and time.  Psychiatric:        Mood and Affect: Mood normal.        Behavior: Behavior normal.        Thought Content: Thought content normal.        Judgment: Judgment normal.     BP (!) 146/60 (BP Location: Left Arm, Patient Position: Sitting, Cuff Size: Large)   Pulse 63   Temp 98 F (36.7 C) (Temporal)   Ht 5\' 8"  (1.727 m)   Wt (!) 336 lb (152.4 kg)   SpO2 93%   BMI 51.09 kg/m  Wt Readings from Last 3 Encounters:  04/11/20 (!) 336 lb (152.4 kg)  01/17/20 (!) 346 lb 2 oz (157 kg)  01/17/20 (!) 346 lb 11.2 oz (157.3 kg)     Health Maintenance Due  Topic Date Due  . OPHTHALMOLOGY EXAM  Never done    There are no preventive care reminders to display for this patient.  Lab Results  Component Value Date   TSH 0.434 08/07/2019   Lab Results  Component Value Date   WBC 6.0 12/13/2019   HGB 10.8 (L) 12/13/2019   HCT 35.1 (L) 12/13/2019   MCV 85.2 12/13/2019   PLT 186 12/13/2019   Lab Results  Component Value Date   NA 139 12/13/2019   K 3.8 12/13/2019   CO2  28 12/13/2019   GLUCOSE 147 (H) 12/13/2019   BUN 17 12/13/2019   CREATININE 1.13 12/13/2019   BILITOT 0.3 10/12/2019   ALKPHOS 118 10/12/2019   AST 30 10/12/2019   ALT 34 09/16/2019   PROT 7.5 10/12/2019   ALBUMIN 4.2 10/12/2019   CALCIUM 8.9 12/13/2019   ANIONGAP 12 12/13/2019   GFR 85.97  01/22/2016   Lab Results  Component Value Date   CHOL 159 10/12/2019   Lab Results  Component Value Date   HDL 45 10/12/2019   Lab Results  Component Value Date   LDLCALC 94 10/12/2019   Lab Results  Component Value Date   TRIG 108 10/12/2019   Lab Results  Component Value Date   CHOLHDL 3.5 10/12/2019   Lab Results  Component Value Date   HGBA1C 6.8 (A) 04/11/2020   HGBA1C 6.8 04/11/2020   HGBA1C 6.8 (A) 04/11/2020   HGBA1C 6.8 04/11/2020      Assessment & Plan:   Problem List Items Addressed This Visit      Cardiovascular and Mediastinum   Benign essential HTN Stable we will continue with current regimen     Endocrine   Type 2 diabetes mellitus with hyperosmolar nonketotic hyperglycemia (HCC) - Primary Controlled at 6.8% no adjustments in therapy.  Encouraged compliance with current treatment regimen   Encourage regular CBG monitoring Encourage contacting office if excessive hyperglycemia and or hypoglycemia Lifestyle modification with healthy diet (fewer calories, more high fiber foods, whole grains and non-starchy vegetables, lower fat meat and fish, low-fat diary include healthy oils) regular exercise (physical activity) and weight loss Opthalmology exam discussed  Regular dental visits encouraged Home BP monitoring also encouraged goal <130/80     Relevant Medications   LANTUS 100 UNIT/ML injection   Other Relevant Orders   POCT Urinalysis Dipstick (Completed)   HgB A1c (Completed)   Comp. 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 Insulin Syringes, Disposable, U-100 0.5 ML MISC    Sig: 1 Syringe by Does not apply route 3 (three) times daily after meals.    Dispense:  100 each    Refill:  11    Order Specific Question:    Supervising Provider    Answer:   Tresa Garter [id number]  . LANTUS 100 UNIT/ML injection    Sig: Inject 0.32 mLs (32 Units total) into the skin 2 (two) times daily.    Dispense:  57.6 mL    Refill:  3    Order Specific Question:   Supervising Provider    Answer:   Tresa Garter [[id number]]    Follow-up: Return in about 6 months (around 10/12/2020).    Vevelyn Francois, NP   [facility]

## 2020-04-11 ENCOUNTER — Ambulatory Visit (INDEPENDENT_AMBULATORY_CARE_PROVIDER_SITE_OTHER): Payer: Medicaid Other | Admitting: Nurse Practitioner

## 2020-04-11 ENCOUNTER — Encounter: Payer: Self-pay | Admitting: Nurse Practitioner

## 2020-04-11 ENCOUNTER — Other Ambulatory Visit: Payer: Self-pay

## 2020-04-11 VITALS — BP 146/60 | HR 63 | Temp 98.0°F | Ht 68.0 in | Wt 336.0 lb

## 2020-04-11 DIAGNOSIS — Z930 Tracheostomy status: Secondary | ICD-10-CM

## 2020-04-11 DIAGNOSIS — E785 Hyperlipidemia, unspecified: Secondary | ICD-10-CM

## 2020-04-11 DIAGNOSIS — I10 Essential (primary) hypertension: Secondary | ICD-10-CM

## 2020-04-11 DIAGNOSIS — E1100 Type 2 diabetes mellitus with hyperosmolarity without nonketotic hyperglycemic-hyperosmolar coma (NKHHC): Secondary | ICD-10-CM

## 2020-04-11 LAB — POCT URINALYSIS DIPSTICK
Bilirubin, UA: NEGATIVE
Blood, UA: NEGATIVE
Glucose, UA: NEGATIVE
Leukocytes, UA: NEGATIVE
Nitrite, UA: NEGATIVE
Protein, UA: NEGATIVE
Spec Grav, UA: 1.02 (ref 1.010–1.025)
Urobilinogen, UA: 1 E.U./dL
pH, UA: 6.5 (ref 5.0–8.0)

## 2020-04-11 LAB — POCT GLYCOSYLATED HEMOGLOBIN (HGB A1C)
HbA1c POC (<> result, manual entry): 6.8 % (ref 4.0–5.6)
HbA1c, POC (controlled diabetic range): 6.8 % (ref 0.0–7.0)
HbA1c, POC (prediabetic range): 6.8 % — AB (ref 5.7–6.4)
Hemoglobin A1C: 6.8 % — AB (ref 4.0–5.6)

## 2020-04-11 MED ORDER — LANTUS 100 UNIT/ML ~~LOC~~ SOLN: 32.0000 [IU] | Freq: Two times a day (BID) | SUBCUTANEOUS | 3 refills | Status: DC

## 2020-04-11 MED ORDER — INSULIN SYRINGES (DISPOSABLE) U-100 0.5 ML MISC: 1.0000 | Freq: Three times a day (TID) | 11 refills | Status: DC

## 2020-04-11 NOTE — Patient Instructions (Addendum)
[facility] [address]tr[address]  [phone]   Diabetes Mellitus and Nutrition, Adult When you have diabetes, or diabetes mellitus, it is very important to have healthy eating habits because your blood sugar (glucose) levels are greatly affected by what you eat and drink. Eating healthy foods in the right amounts, at about the same times every day, can help you:  Control your blood glucose.  Lower your risk of heart disease.  Improve your blood pressure.  Reach or maintain a healthy weight. What can affect my meal plan? Every person with diabetes is different, and each person has different needs for a meal plan. Your health care provider may recommend that you work with a dietitian to make a meal plan that is best for you. Your meal plan may vary depending on factors such as:  The calories you need.  The medicines you take.  Your weight.  Your blood glucose, blood pressure, and cholesterol levels.  Your activity level.  Other health conditions you have, such as heart or kidney disease. How do carbohydrates affect me? Carbohydrates, also called carbs, affect your blood glucose level more than any other type of food. Eating carbs naturally raises the amount of glucose in your blood. Carb counting is a method for keeping track of how many carbs you eat. Counting carbs is important to keep your blood glucose at a healthy level, especially if you use insulin or take certain oral diabetes medicines. It is important to know how many carbs you can safely have in each meal. This is different for every person. Your dietitian can help you calculate how many carbs you should have at each meal and for each snack. How does alcohol affect me? Alcohol can cause a sudden decrease in blood glucose (hypoglycemia), especially if you use insulin or take certain oral diabetes medicines. Hypoglycemia can be a life-threatening condition. Symptoms of hypoglycemia, such as  sleepiness, dizziness, and confusion, are similar to symptoms of having too much alcohol.  Do not drink alcohol if: ? Your health care provider tells you not to drink. ? You are pregnant, may be pregnant, or are planning to become pregnant.  If you drink alcohol: ? Do not drink on an empty stomach. ? Limit how much you use to:  0-1 drink a day for women.  0-2 drinks a day for men. ? Be aware of how much alcohol is in your drink. In the [place] one drink equals one 12 oz bottle of beer (355 mL), one 5 oz glass of wine (148 mL), or one 1 oz glass of hard liquor (44 mL). ? Keep yourself hydrated with water, diet soda, or unsweetened iced tea.  Keep in mind that regular soda, juice, and other mixers may contain a lot of sugar and must be counted as carbs. What are tips for following this plan? Reading food labels  Start by checking the serving size on the "Nutrition Facts" label of packaged foods and drinks. The amount of calories, carbs, fats, and other nutrients listed on the label is based on one serving of the item. Many items contain more than one serving per package.  Check the total grams (g) of carbs in one serving. You can calculate the number of servings of carbs in one serving by dividing the total carbs by 15. For example, if a food has 30 g of total carbs per serving, it would be equal to 2 servings of carbs.  Check the number of  grams (g) of saturated fats and trans fats in one serving. Choose foods that have a low amount or none of these fats.  Check the number of milligrams (mg) of salt (sodium) in one serving. Most people should limit total sodium intake to less than 2,300 mg per day.  Always check the nutrition information of foods labeled as "low-fat" or "nonfat." These foods may be higher in added sugar or refined carbs and should be avoided.  Talk to your dietitian to identify your daily goals for nutrients listed on the label. Shopping  Avoid buying canned, pre-made,  or processed foods. These foods tend to be high in fat, sodium, and added sugar.  Shop around the outside edge of the grocery store. This is where you will most often find fresh fruits and vegetables, bulk grains, fresh meats, and fresh dairy. Cooking  Use low-heat cooking methods, such as baking, instead of high-heat cooking methods like deep frying.  Cook using healthy oils, such as olive, canola, or sunflower oil.  Avoid cooking with butter, cream, or high-fat meats. Meal planning  Eat meals and snacks regularly, preferably at the same times every day. Avoid going long periods of time without eating.  Eat foods that are high in fiber, such as fresh fruits, vegetables, beans, and whole grains. Talk with your dietitian about how many servings of carbs you can eat at each meal.  Eat 4-6 oz (112-168 g) of lean protein each day, such as lean meat, chicken, fish, eggs, or tofu. One ounce (oz) of lean protein is equal to: ? 1 oz (28 g) of meat, chicken, or fish. ? 1 egg. ?  cup (62 g) of tofu.  Eat some foods each day that contain healthy fats, such as avocado, nuts, seeds, and fish.   What foods should I eat? Fruits Berries. Apples. Oranges. Peaches. Apricots. Plums. Grapes. Mango. Papaya. Pomegranate. Kiwi. Cherries. Vegetables Lettuce. Spinach. Leafy greens, including kale, chard, collard greens, and mustard greens. Beets. Cauliflower. Cabbage. Broccoli. Carrots. Green beans. Tomatoes. Peppers. Onions. Cucumbers. Brussels sprouts. Grains Whole grains, such as whole-wheat or whole-grain bread, crackers, tortillas, cereal, and pasta. Unsweetened oatmeal. Quinoa. Brown or wild rice. Meats and other proteins Seafood. Poultry without skin. Lean cuts of poultry and beef. Tofu. Nuts. Seeds. Dairy Low-fat or fat-free dairy products such as milk, yogurt, and cheese. The items listed above may not be a complete list of foods and beverages you can eat. Contact a dietitian for more  information. What foods should I avoid? Fruits Fruits canned with syrup. Vegetables Canned vegetables. Frozen vegetables with butter or cream sauce. Grains Refined white flour and flour products such as bread, pasta, snack foods, and cereals. Avoid all processed foods. Meats and other proteins Fatty cuts of meat. Poultry with skin. Breaded or fried meats. Processed meat. Avoid saturated fats. Dairy Full-fat yogurt, cheese, or milk. Beverages Sweetened drinks, such as soda or iced tea. The items listed above may not be a complete list of foods and beverages you should avoid. Contact a dietitian for more information. Questions to ask a health care provider  Do I need to meet with a diabetes educator?  Do I need to meet with a dietitian?  What number can I call if I have questions?  When are the best times to check my blood glucose? Where to find more information:  American Diabetes Association: diabetes.org  Academy of Nutrition and Dietetics: www.eatright.[employer] of Diabetes and Digestive and Kidney Diseases: [employer]  Association of  Diabetes Care and Education Specialists: www.diabeteseducator.org Summary  It is important to have healthy eating habits because your blood sugar (glucose) levels are greatly affected by what you eat and drink.  A healthy meal plan will help you control your blood glucose and maintain a healthy lifestyle.  Your health care provider may recommend that you work with a dietitian to make a meal plan that is best for you.  Keep in mind that carbohydrates (carbs) and alcohol have immediate effects on your blood glucose levels. It is important to count carbs and to use alcohol carefully. This information is not intended to replace advice given to you by your health care provider. Make sure you discuss any questions you have with your health care provider. Document Revised: 12/13/2018 Document Reviewed: 12/13/2018 Elsevier  Patient Education  2021 [employer].

## 2020-04-12 LAB — COMP. METABOLIC PANEL (12)
AST: 21 IU/L (ref 0–40)
Albumin/Globulin Ratio: 1 — ABNORMAL LOW (ref 1.2–2.2)
Albumin: 4 g/dL (ref 3.8–4.9)
Alkaline Phosphatase: 137 IU/L — ABNORMAL HIGH (ref 44–121)
BUN/Creatinine Ratio: 13 (ref 9–20)
BUN: 15 mg/dL (ref 6–24)
Bilirubin Total: 0.4 mg/dL (ref 0.0–1.2)
Calcium: 9.5 mg/dL (ref 8.7–10.2)
Chloride: 101 mmol/L (ref 96–106)
Creatinine, Ser: 1.18 mg/dL (ref 0.76–1.27)
Globulin, Total: 3.9 g/dL (ref 1.5–4.5)
Glucose: 140 mg/dL — ABNORMAL HIGH (ref 65–99)
Potassium: 4.1 mmol/L (ref 3.5–5.2)
Sodium: 143 mmol/L (ref 134–144)
Total Protein: 7.9 g/dL (ref 6.0–8.5)
eGFR: 72 mL/min/{1.73_m2} (ref >59)

## 2020-04-16 ENCOUNTER — Telehealth: Payer: Self-pay | Admitting: Nurse Practitioner

## 2020-04-16 NOTE — Telephone Encounter (Signed)
Patient called saying that the number he was provided with for Tracheostomy dependence is not the correct one. Please follow up.

## 2020-04-17 ENCOUNTER — Other Ambulatory Visit: Payer: Self-pay | Admitting: Nurse Practitioner

## 2020-04-17 NOTE — Telephone Encounter (Signed)
I have reached out to one of the providers from the [facility] care team.  Hopefully someone will call him or provide [place] with the correct contact information.  Please make the patient aware.

## 2020-04-19 ENCOUNTER — Telehealth: Payer: Self-pay

## 2020-04-19 NOTE — Telephone Encounter (Signed)
Patient informed of lab results. 

## 2020-04-19 NOTE — Telephone Encounter (Signed)
-----   Message from Vevelyn Francois, NP sent at 04/19/2020  1:41 PM EDT ----- Overall labs are stable

## 2020-04-22 ENCOUNTER — Other Ambulatory Visit (HOSPITAL_COMMUNITY)
Admission: RE | Admit: 2020-04-22 | Discharge: 2020-04-22 | Disposition: A | Discharge status: Home / Self Care | Payer: Medicaid Other | Source: Ambulatory Visit | Attending: Nurse Practitioner | Admitting: Nurse Practitioner

## 2020-04-22 DIAGNOSIS — Z20822 Contact with and (suspected) exposure to covid-19: Secondary | ICD-10-CM | POA: Diagnosis not present

## 2020-04-22 DIAGNOSIS — Z01812 Encounter for preprocedural laboratory examination: Secondary | ICD-10-CM | POA: Insufficient documentation

## 2020-04-22 LAB — SARS CORONAVIRUS 2 (TAT 6-24 HRS): SARS Coronavirus 2: NEGATIVE

## 2020-04-23 ENCOUNTER — Other Ambulatory Visit: Payer: Self-pay

## 2020-04-23 ENCOUNTER — Ambulatory Visit (HOSPITAL_COMMUNITY)
Admission: RE | Admit: 2020-04-23 | Discharge: 2020-04-23 | Disposition: A | Discharge status: Home / Self Care | Payer: Medicaid Other | Source: Ambulatory Visit | Attending: Acute Care | Admitting: Acute Care

## 2020-04-23 DIAGNOSIS — E669 Obesity, unspecified: Secondary | ICD-10-CM | POA: Insufficient documentation

## 2020-04-23 DIAGNOSIS — G4733 Obstructive sleep apnea (adult) (pediatric): Secondary | ICD-10-CM | POA: Diagnosis not present

## 2020-04-23 DIAGNOSIS — Z430 Encounter for attention to tracheostomy: Secondary | ICD-10-CM | POA: Insufficient documentation

## 2020-04-23 DIAGNOSIS — Z4682 Encounter for fitting and adjustment of non-vascular catheter: Secondary | ICD-10-CM | POA: Insufficient documentation

## 2020-04-23 NOTE — Progress Notes (Signed)
Reason for visit  Trach change and evaluation for decannulation  HPI Timothy Le is well known to me. I last saw him just before Thanksgiving after he had been released from Rehab. Since I saw him last things have been up and down. On the down side of things his trach has become dislodged twice. Once about a week after our visit, at that time Dr Constance Holster was able to replace it in the ER but had to dilate the stoma in order to do so. The second time happened in December at that point his size 5 was not available in stock and Dr Wilburn Cornelia still had to upsize in order to place size 4. Since this he does have trouble with the trach occluding requiring him to remove and clean it several times a day. He presents today with the question of could it be removed?  In preporation to seeing him I called Dr Constance Holster. We had decided tentatively that because we only would have two options (decannulate or upsize) if he could tolerate capping and we confirmed successful CPAP at HS we would go forward with decannulation but if he were to fail I would refer him back to dr Constance Holster so that he could be upsized/revised in the OR under ideal conditions.   Review of Systems  Constitutional: Negative.   HENT: Negative.   Eyes: Negative.   Respiratory: Negative.   Cardiovascular: Negative.   Gastrointestinal: Negative.   Genitourinary: Negative.   Musculoskeletal: Negative.   Skin: Negative.   Neurological: Negative.   Endo/Heme/Allergies: Negative.   Psychiatric/Behavioral: Negative.    Exam General this is a 60 year old aam. He is ambulatory and walked into clinic independently. Overall he has made remarkable improvements. In past he was essentially WC bound HENT NCAT does have rush of air when PMV removed. We placed red cap on. His Phonation is excellent. MMM. He actually feels better with this Pulm clear Card rrr abd soft Ext warm and dry  Neuro intact  Procedure Trach changed. The size 4 was removed.  A new size 4  replaced w/out difficulty. ETCO2 confirmation obtained  Impression and plan OSA OHS Chronic diastolic HF Trach dependence Obesity   discussion Timothy Le has made absolutely remarkable progress since I last saw him about 6 months ago. He actually walking into the clinic. I have always known him as WC dependent. I think given his progress physically especially w/ his weight loss and dedication to his health at this point we should work towards decannulation once again. I have sent him home w/ occlusive red caps to initiate capping trials/. He will wear these while is is awake and remove at HS. He has both a CPAP and BIPAP at home he tells me, but he does not have a mask. I have asked our office to get him one thru the DME. I do need help from Dr Annamaria Boots re: which way to go support wise (CPAP vs BIPAP). Once he proves he can do well once again w/ which ever Dr Annamaria Boots advises I think we can proceed w/ removing trach. If he has trouble then I will refer him back to Dr Constance Holster for upsizing.   Plan Capping trials Get CPAP mask Dr Annamaria Boots to address nocturnal support modality (CPAP vs BIPAP and settings) decannulate once we know above addressed vs back to Dr Constance Holster for trach revision/up-size if fails.  I will see him in 4 weeks   My time 40 minutes  Erick Colace ACNP-BC [facility] Pager # [phone] OR # [phone] if no answer

## 2020-04-23 NOTE — Progress Notes (Signed)
Tracheostomy Procedure Note  Timothy Le [id number] [birth year]  Pre Procedure Tracheostomy Information  Trach Brand: Shiley Size: 4.0 Style: Uncuffed Secured by: Velcro   Procedure: Trach change and trach cleaning    Post Procedure Tracheostomy Information  Trach Brand: Shiley Size: 4.0 WI20B Style: Uncuffed Secured by: Velcro Changed to  New style trach today  Post Procedure Evaluation:  ETCO2 positive color change from yellow to purple : Yes.   Vital signs:VSS Patients current condition: stable Complications: No apparent complications Trach site exam: clean and dry Wound care done: 4 x 4 gauze  Drain gauge Patient did tolerate procedure well.   Education: Education on new style trach 4.0 cuffess  [id number] ad use of red cap  Prescription needs: none    Additional needs: Pt givena new PMV , extra red caps and an additional #4 trach [phone]

## 2020-05-29 ENCOUNTER — Other Ambulatory Visit (HOSPITAL_COMMUNITY)
Admission: RE | Admit: 2020-05-29 | Discharge: 2020-05-29 | Disposition: A | Discharge status: Home / Self Care | Payer: Medicaid Other | Source: Ambulatory Visit | Attending: Acute Care | Admitting: Acute Care

## 2020-05-29 DIAGNOSIS — Z20822 Contact with and (suspected) exposure to covid-19: Secondary | ICD-10-CM | POA: Diagnosis not present

## 2020-05-29 DIAGNOSIS — Z01812 Encounter for preprocedural laboratory examination: Secondary | ICD-10-CM | POA: Insufficient documentation

## 2020-05-29 LAB — SARS CORONAVIRUS 2 (TAT 6-24 HRS): SARS Coronavirus 2: NEGATIVE

## 2020-05-31 ENCOUNTER — Ambulatory Visit (HOSPITAL_COMMUNITY)
Admission: RE | Admit: 2020-05-31 | Discharge: 2020-05-31 | Disposition: A | Discharge status: Home / Self Care | Payer: Medicaid Other | Source: Ambulatory Visit | Attending: Acute Care | Admitting: Acute Care

## 2020-05-31 ENCOUNTER — Other Ambulatory Visit: Payer: Self-pay

## 2020-05-31 DIAGNOSIS — G4733 Obstructive sleep apnea (adult) (pediatric): Secondary | ICD-10-CM | POA: Diagnosis not present

## 2020-05-31 DIAGNOSIS — E669 Obesity, unspecified: Secondary | ICD-10-CM | POA: Insufficient documentation

## 2020-05-31 DIAGNOSIS — Z430 Encounter for attention to tracheostomy: Secondary | ICD-10-CM | POA: Diagnosis present

## 2020-05-31 DIAGNOSIS — I5032 Chronic diastolic (congestive) heart failure: Secondary | ICD-10-CM | POA: Diagnosis not present

## 2020-05-31 DIAGNOSIS — Z930 Tracheostomy status: Secondary | ICD-10-CM

## 2020-05-31 NOTE — Progress Notes (Addendum)
Tracheostomy Procedure Note  XZAVION DOSWELL [id number] 1960-12-16  Pre Procedure Tracheostomy Information  Trach Brand: Shiley [id number] Size:4.0 Style: Uncuffed Secured by: Velcro   Procedure: Trach change and Trach cleaning    Post Procedure Tracheostomy Information  Trach BrandCristy Hilts  [id number] Size: 4.0 Style: Uncuffed Secured by: Velcro   Post Procedure Evaluation:  ETCO2 positive color change from yellow to purple : Yes.   Vital signs:blood pressure VSS Patients current condition: stable Complications: No apparent complications Trach site exam: clean Wound care done: dry Patient did tolerate procedure well.   Education :none  Prescription needs :none    Additional needs: New PMV given to patient today.  Patient also given 3 additional red caps

## 2020-05-31 NOTE — Progress Notes (Signed)
Reason for visit  Trach change and evaluation for decannulation  HPI Timothy Le is well known to me. I last saw him on 4/5. At that point he was doing much better but from a trach stand-point he had been down-sized in ER after trach got dislodged so I reached out to ENT and we decided at this point we either needed to work towards decannulation or referring him back for up-sizing. Based on Timothy Le's request we were working towards decannulation. The barrier to this was at the time of our last visit Timothy Le did not have a CPAP mask which I have since placed order for the mask but he has not received these yet. He presents today for routine trach change.   .Review of Systems  Constitutional: Negative.   HENT: Negative.   Eyes: Negative.   Respiratory: Negative.   Cardiovascular: Negative.   Gastrointestinal: Negative.   Genitourinary: Negative.   Musculoskeletal: Negative.   Skin: Negative.   Neurological: Negative.   Endo/Heme/Allergies: Negative.   Psychiatric/Behavioral: Negative.    Exam General this is a 60 year old male. He is sitting in [facility] but is now ambulatory and continues to improve.  HENT NCAT no JVD has 4 cuffless trach the site is clean. No stridor but he is able to phonate w/out PMV.  Pulm cl and w/out accessory use  Card rrr abd soft  Ext warm and dry  Neuro intact  Procedure Trach change 4 cuffless removed. Site inspected and unremarkable. ETCO2 confirmed.   Impression and plan OSA OHS Chronic diastolic HF Trach dependence Obesity   discussion Doing well. Not candidate for decannulation until we have his CPAP regimen figured out. From the notes it looks like he is typically on 20 cmH2O. With his wt loss I'm not sure if this is still what he needs or not. Would be helpful to have a little direction here. He is tolerating capping w/ occlusive valve so as soon as we have night time figured out we can get trach out.   Plan Cont routine trach care ROV 8 weeks Have placed  message to office for DME supply and sleep follow up   My time 32 min 1/3 time chart review/1/3 time face to face and rest formulating plan   Erick Colace ACNP-BC [place] Pager # [phone] OR # [phone] if no answer

## 2020-06-03 ENCOUNTER — Telehealth: Payer: Self-pay

## 2020-06-03 NOTE — Telephone Encounter (Signed)
Called [facility] and spoke w/ Nira Conn who states they did receive the order and it was sent to their "resupply department" but order was never processed. States she is going to call the patient right away to get him set up w/ a new mask.

## 2020-06-03 NOTE — Telephone Encounter (Signed)
lmtcb to schedule patient a follow-up appointment. When patient calls back please schedule appt with Dr. Melina Fiddler to use a held spot per Dr. Annamaria Boots.   Order for cpap mask to [facility] was placed on 04/23/20. PCCs please advise on status of order, thanks!

## 2020-06-03 NOTE — Telephone Encounter (Signed)
-----   Message from Deneise Lever, MD sent at 05/31/2020  3:01 PM EDT ----- Regarding: RE: DME and follow up Please order replacement CPAP mask of choice.  Please set up early return ov with me- ok to use held spot  Thanks- CDY ----- Message ----- From: Erick Colace, NP Sent: 05/31/2020   2:12 PM EDT To: Deneise Lever, MD, [facility] Triage Pool Subject: DME and follow up                              Luisfelipe Engelstad is doing great. I had requested and signed order for you guys to place new DME order for his CPAP mask which he did not get yet. Will you please send a new order to his DME for a new CPAP mask. Also please note: He has a New address so DME might want to know that.   Lastly-->please see if we can get him a appointment w/ Dr Annamaria Boots as soon as you can too.   My plan is to decannulate him once we have his CPAP regimen locked down for night  pete

## 2020-06-10 ENCOUNTER — Ambulatory Visit (INDEPENDENT_AMBULATORY_CARE_PROVIDER_SITE_OTHER): Payer: Medicaid Other | Admitting: Podiatry

## 2020-06-10 ENCOUNTER — Encounter: Payer: Self-pay | Admitting: Podiatry

## 2020-06-10 ENCOUNTER — Other Ambulatory Visit: Payer: Self-pay

## 2020-06-10 DIAGNOSIS — N179 Acute kidney failure, unspecified: Secondary | ICD-10-CM | POA: Diagnosis not present

## 2020-06-10 DIAGNOSIS — M79674 Pain in right toe(s): Secondary | ICD-10-CM

## 2020-06-10 DIAGNOSIS — M79675 Pain in left toe(s): Secondary | ICD-10-CM | POA: Diagnosis not present

## 2020-06-10 DIAGNOSIS — Z794 Long term (current) use of insulin: Secondary | ICD-10-CM

## 2020-06-10 DIAGNOSIS — B351 Tinea unguium: Secondary | ICD-10-CM

## 2020-06-10 DIAGNOSIS — E1165 Type 2 diabetes mellitus with hyperglycemia: Secondary | ICD-10-CM

## 2020-06-10 NOTE — Progress Notes (Signed)
This patient returns to my office for at risk foot care.  This patient requires this care by a professional since this patient will be at risk due to having diabetes  This patient is unable to cut nails himself since the patient cannot reach his nails.These nails are painful walking and wearing shoes.  This patient presents for at risk foot care today.  General Appearance  Alert, conversant and in no acute stress.  Vascular  Dorsalis pedis and posterior tibial  pulses are palpable  bilaterally.  Capillary return is within normal limits  bilaterally. Temperature is within normal limits  bilaterally.  Neurologic  Senn-Weinstein monofilament wire test within normal limits  bilaterally. Muscle power within normal limits bilaterally.  Nails Thick disfigured discolored nails with subungual debris  from hallux to fifth toes bilaterally. No evidence of bacterial infection or drainage bilaterally.  Orthopedic  No limitations of motion  feet .  No crepitus or effusions noted.  No bony pathology or digital deformities noted.  HAV  B/L.  Skin  normotropic skin with no porokeratosis noted bilaterally.  No signs of infections or ulcers noted.   Plantar callus along weight bearing areas both feet.  Onychomycosis  Pain in right toes  Pain in left toes  Consent was obtained for treatment procedures.   Mechanical debridement of nails 1-5  bilaterally performed with a nail nipper.  Filed with dremel without incident.    Return office visit      Prn.               Told patient to return for periodic foot care and evaluation due to potential at risk complications.   Onelia Cadmus DPM  

## 2020-10-11 ENCOUNTER — Other Ambulatory Visit: Payer: Self-pay

## 2020-10-11 ENCOUNTER — Ambulatory Visit (HOSPITAL_COMMUNITY)
Admission: RE | Admit: 2020-10-11 | Discharge: 2020-10-11 | Disposition: A | Discharge status: Home / Self Care | Payer: Medicaid Other | Source: Ambulatory Visit | Attending: Physician Assistant | Admitting: Physician Assistant

## 2020-10-11 ENCOUNTER — Ambulatory Visit (INDEPENDENT_AMBULATORY_CARE_PROVIDER_SITE_OTHER): Payer: Medicaid Other | Admitting: Physician Assistant

## 2020-10-11 VITALS — BP 146/77 | HR 62 | Temp 98.0°F | Resp 20 | Ht 68.0 in | Wt 374.0 lb

## 2020-10-11 DIAGNOSIS — I824Y9 Acute embolism and thrombosis of unspecified deep veins of unspecified proximal lower extremity: Secondary | ICD-10-CM

## 2020-10-11 NOTE — Progress Notes (Signed)
[facility] [place]     History of Present Illness  Timothy Le is a 60 y.o. (10-19-1960) male who was seen in consultation during hospitalization for extensive left lower extremity DVT.  He presents today for routine follow-up status post thrombectomy of IVC, left common external leg veins left common femoral vein with Inari Clottriever.  Status post Wallstent of left common iliac vein as well as balloon angioplasty of left common femoral vein by Dr. Donzetta Matters on 12/11/2019.    He was discharged home on Eliquis.  He ran out of Eliquis 4 months ago.  Past medical history: morbid obesity, obstructive sleep apnea, CHF, COPD and chronic respiratory failure with trach    He is compliant with a statin. History of diabetes mellitus type 2, insulin requiring  Past Medical History:  Diagnosis Date   Asthma    Chronic respiratory failure with hypoxia ([place]) 07/09/2017   COPD (chronic obstructive pulmonary disease) (H[facility])    Diabetes mellitus without complication ([place])    Difficult intubation    Hypertension    Shortness of breath dyspnea    Sleep apnea     Past Surgical History:  Procedure Laterality Date   INTRAVASCULAR ULTRASOUND/IVUS Left 12/11/2019   Procedure: Intravascular Ultrasound/IVUS;  Surgeon: Waynetta Sandy, MD;  Location: [facility];  Service: Cardiovascular;  Laterality: Left;  Left lower extremity venous   IR GASTROSTOMY TUBE MOD SED  07/05/2017   PERIPHERAL VASCULAR THROMBECTOMY Left 12/11/2019   Procedure: PERIPHERAL VASCULAR THROMBECTOMY;  Surgeon: Waynetta Sandy, MD;  Location: [facility];  Service: Cardiovascular;  Laterality: Left;   THYROIDECTOMY Left 08/30/2019   Procedure: THYROIDECTOMY CERVICAL LIPECTOMY;  Surgeon: Izora Gala, MD;  Location: [facility];  Service: ENT;  Laterality: Left;  NEEDS RNFA PLEASE   TRACHEOSTOMY TUBE PLACEMENT N/A 06/17/2017   Procedure: TRACHEOSTOMY;  Surgeon: Leta Baptist, MD;  Location: [facility];  Service: ENT;  Laterality: N/A;   TRACHEOSTOMY TUBE PLACEMENT N/A 08/14/2019   Procedure: TRACHEOSTOMY;  Surgeon: Izora Gala, MD;  Location: [facility];  Service: ENT;  Laterality: N/A;   TRACHEOSTOMY TUBE PLACEMENT N/A 08/30/2019   Procedure: TRACHEOSTOMY  REVISION;  Surgeon: Izora Gala, MD;  [facility];  Service: ENT;  Laterality: N/A;    Social History   Socioeconomic History   Marital status: Legally Separated    Spouse name: Not on file   Number of children: 1   Years of education: Not on file   Highest education level: Not on file  Occupational History   Occupation: unemployed    Comment: working on disablity  Tobacco Use   Smoking status: Former    Packs/day: 0.50    Years: 0.50    Pack years: 0.25    Types: Cigarettes    Start date: 07/28/1983    Quit date: 02/27/2013    Years since quitting: 7.6   Smokeless tobacco: Never  Vaping Use   Vaping Use: Never used  Substance and Sexual Activity   Alcohol use: No    Comment: quit ETOH about 5 months ago-beer and liquor   Drug use: No    Comment: quit 6 months-"pot"-smoked couple joints a week   Sexual activity: Not Currently  Other Topics Concern   Not on file  Social History Narrative   Not on file   Social Determinants of Health   Financial Resource Strain: Not on file  Food Insecurity: Not on file  Transportation Needs: Not on file  Physical Activity: Not on file  Stress: Not on file  Social Connections: Not on file  Intimate Partner Violence: Not on file    Family History  Problem Relation Age of Onset   Asthma Sister    Asthma Other        nephew   Hypertension Sister    Diabetes type II Sister     Current Outpatient Medications on File Prior to Visit  Medication Sig Dispense Refill   acetaminophen (TYLENOL) 325 MG tablet Take 1-2 tablets (325-650 mg total) by mouth every 4 (four) hours as needed for mild pain.     allopurinol (ZYLOPRIM) 100 MG tablet Take 1 tablet (100 mg total) by mouth  daily. 90 tablet 3   atorvastatin (LIPITOR) 10 MG tablet Take 1 tablet (10 mg total) by mouth daily. 90 tablet 3   BD INSULIN SYRINGE U/F 31G X 5/16" 0.5 ML MISC USE FOR DIABETIC TESTING     diclofenac Sodium (VOLTAREN) 1 % GEL Apply 2 g topically 4 (four) times daily. 150 g 5   gabapentin (NEURONTIN) 100 MG capsule Take 2 capsules (200 mg total) by mouth 3 (three) times daily. 540 capsule 3   Insulin Syringes, Disposable, U-100 0.5 ML MISC 1 Syringe by Does not apply route 3 (three) times daily after meals. 100 each 11   ipratropium-albuterol (DUONEB) 0.5-2.5 (3) MG/3ML SOLN Take 3 mLs by nebulization every 6 (six) hours as needed. 360 mL    LANTUS 100 UNIT/ML injection Inject 0.32 mLs (32 Units total) into the skin 2 (two) times daily. 57.6 mL 3   metoprolol tartrate (LOPRESSOR) 25 MG tablet Take 0.5 tablets (12.5 mg total) by mouth 2 (two) times daily. 90 tablet 3   mometasone-formoterol (DULERA) 100-5 MCG/ACT AERO Inhale 2 puffs into the lungs 2 (two) times daily. 1 each 0   pantoprazole (PROTONIX) 40 MG tablet Take 1 tablet by mouth at bedtime.     potassium chloride SA (KLOR-CON) 20 MEQ tablet Take 1 tablet (20 mEq total) by mouth 2 (two) times daily. 180 tablet 3   QUEtiapine (SEROQUEL) 100 MG tablet Take 1 tablet (100 mg total) by mouth 2 (two) times daily. 180 tablet 3   sertraline (ZOLOFT) 100 MG tablet Take 1 tablet (100 mg total) by mouth daily. 90 tablet 3   torsemide (DEMADEX) 10 MG tablet Take 1 tablet (10 mg total) by mouth daily. 90 tablet 3   traZODone (DESYREL) 50 MG tablet TAKE 0.5-1 TABLETS (25-50 MG TOTAL) BY MOUTH AT BEDTIME AS NEEDED FOR SLEEP. 15 tablet 11   alum & mag hydroxide-simeth (MAALOX/MYLANTA) 200-200-20 MG/5ML suspension Take 30 mLs by mouth every 4 (four) hours as needed for indigestion, heartburn or flatulence. (Patient not taking: No sig reported) 355 mL 0   nutrition supplement, JUVEN, (JUVEN) PACK Take 1 packet by mouth 2 (two) times daily between meals.  (Patient not taking: No sig reported) 60 packet 0   [DISCONTINUED] apixaban (ELIQUIS) 5 MG TABS tablet Take 1 tablet (5 mg total) by mouth 2 (two) times daily. 180 tablet 0   No current facility-administered medications on file prior to visit.    Allergies as of 10/11/2020   (No Known Allergies)     ROS:   General:  No weight loss, Fever, chills  HEENT: No recent headaches, no nasal bleeding, no visual changes, no sore throat  Neurologic: No dizziness, blackouts, seizures. No recent symptoms of stroke or mini- stroke. No recent episodes of slurred speech, or temporary blindness.  Cardiac: No recent episodes of chest pain/pressure, no shortness of breath at rest.  No shortness of breath with exertion.  Denies history of atrial fibrillation or irregular heartbeat  Vascular: No history of rest pain in feet.  No history of claudication.  No history of non-healing ulcer, positve history of DVT   Pulmonary: No home oxygen, no productive cough, no hemoptysis,  No asthma or wheezing Trach collar  Musculoskeletal:  [ ]  Arthritis, [ ]  Low back pain,  [ ]  Joint pain  Hematologic:No history of hypercoagulable state.  No history of easy bleeding.  No history of anemia  Gastrointestinal: No hematochezia or melena,  No gastroesophageal reflux, no trouble swallowing  Urinary: [ ]  chronic Kidney disease, [ ]  on HD - [ ]  MWF or [ ]  TTHS, [ ]  Burning with urination, [ ]  Frequent urination, [ ]  Difficulty urinating;   Skin: No rashes  Psychological: No history of anxiety,  No history of depression  Physical Examination  Vitals:   10/11/20 1058  BP: (!) 146/77  Pulse: 62  Resp: 20  Temp: 98 F (36.7 C)  TempSrc: Temporal  SpO2: 94%  Weight: (!) 374 lb (169.6 kg)  Height: 5\' 8"  (1.727 m)    Body mass index is 56.87 kg/m.  General:  Alert and oriented, no acute distress HEENT: Normal Neck: No bruit or JVD Pulmonary: Clear to auscultation bilaterally Cardiac: Regular Rate and  Rhythm without murmur Abdomen: Soft, non-tender, non-distended, no mass, no scars Skin: No rash Extremity Pulses:  2+ radial, brachial, femoral, dorsalis pedis, pulses bilaterally Musculoskeletal: No deformity or edema  Neurologic: Upper and lower extremity motor 5/5 and symmetric  DATA:    IVC/Iliac Findings:  +----------+------+--------+--------------+     IVC    PatentThrombus   Comments     +----------+------+--------+--------------+  IVC Prox                not visualized  +----------+------+--------+--------------+  IVC Mid                 not visualized  +----------+------+--------+--------------+  IVC Distalpatent                        +----------+------+--------+--------------+      +-------------------+---------+-----------+---------+-----------+----------  ----+          CIV        RT-PatentRT-ThrombusLT-PatentLT-Thrombus    Comments     +-------------------+---------+-----------+---------+-----------+----------  ----+  Common Iliac Prox                       patent                               +-------------------+---------+-----------+---------+-----------+----------  ----+  Common Iliac Mid                        patent                               +-------------------+---------+-----------+---------+-----------+----------  ----+  Common Iliac Distal                                        not  visualized  +-------------------+---------+-----------+---------+-----------+----------  ----+       +-------------------------+---------+-----------+---------+-----------+----  ----+  EIV            RT-PatentRT-ThrombusLT-PatentLT-ThrombusComments  +-------------------------+---------+-----------+---------+-----------+----  ----+  External Iliac Vein Prox                      patent                         +-------------------------+---------+-----------+---------+-----------+----  ----+   External Iliac Vein Mid                       patent                         +-------------------------+---------+-----------+---------+-----------+----  ----+  External Iliac Vein                           patent                         Distal                                                                       +-------------------------+---------+-----------+---------+-----------+----  ----+      Summary:  IVC/Iliac: Visualization of proximal Inferior Vena Cava, mid inferior vena  cava and distal common Iliac was limited. No evidence of thrombus in the  visualized segments of the IVC/iliac veins. Stent was not visualized,  however, left common iliac vein  appears patent.   Assessment/Plan: Significant DVT left LE 12/11/19 s/p thrombectomy Stent of left common iliac vein with 16 x 90 mm Wallstent and balloon angiotplast left common femoral vein. The venous duplex shows a patent system He has compression he wears daily.   He will start ASA 81 mg daily.  There is no indication for re starting the Eliquis.  He has been off of it without recurrent symptoms.  F/U in 1 year for repeat venous duplex.   Roxy Horseman PA-C [facility] [place] Office: [phone]  MD in clinic [place]

## 2020-10-14 ENCOUNTER — Ambulatory Visit: Payer: Self-pay | Admitting: Nurse Practitioner

## 2020-10-16 ENCOUNTER — Other Ambulatory Visit: Payer: Self-pay | Admitting: Nurse Practitioner

## 2020-10-18 ENCOUNTER — Telehealth (INDEPENDENT_AMBULATORY_CARE_PROVIDER_SITE_OTHER): Payer: Medicaid Other | Admitting: Nurse Practitioner

## 2020-10-18 ENCOUNTER — Other Ambulatory Visit: Payer: Self-pay

## 2020-10-18 DIAGNOSIS — E1100 Type 2 diabetes mellitus with hyperosmolarity without nonketotic hyperglycemic-hyperosmolar coma (NKHHC): Secondary | ICD-10-CM

## 2020-10-18 NOTE — Progress Notes (Signed)
   [address] [place], [place]  [id number] Phone:  [phone]   Fax:  [phone] Virtual Visit via Telephone Note  I was unable to connect with [name] on 10/18/20 at 11:20 AM EDT by telephone however I was able to leave a message related to the nature of the call.   Vevelyn Francois, NP

## 2020-10-21 ENCOUNTER — Ambulatory Visit (INDEPENDENT_AMBULATORY_CARE_PROVIDER_SITE_OTHER): Payer: Medicaid Other | Admitting: Nurse Practitioner

## 2020-10-21 ENCOUNTER — Encounter: Payer: Self-pay | Admitting: Nurse Practitioner

## 2020-10-21 ENCOUNTER — Other Ambulatory Visit: Payer: Self-pay

## 2020-10-21 VITALS — BP 154/74 | HR 92 | Temp 97.5°F | Ht 68.0 in | Wt 387.6 lb

## 2020-10-21 DIAGNOSIS — E785 Hyperlipidemia, unspecified: Secondary | ICD-10-CM

## 2020-10-21 DIAGNOSIS — F418 Other specified anxiety disorders: Secondary | ICD-10-CM

## 2020-10-21 DIAGNOSIS — I10 Essential (primary) hypertension: Secondary | ICD-10-CM

## 2020-10-21 DIAGNOSIS — Z23 Encounter for immunization: Secondary | ICD-10-CM

## 2020-10-21 DIAGNOSIS — Z930 Tracheostomy status: Secondary | ICD-10-CM

## 2020-10-21 DIAGNOSIS — E1100 Type 2 diabetes mellitus with hyperosmolarity without nonketotic hyperglycemic-hyperosmolar coma (NKHHC): Secondary | ICD-10-CM | POA: Diagnosis not present

## 2020-10-21 DIAGNOSIS — F4310 Post-traumatic stress disorder, unspecified: Secondary | ICD-10-CM

## 2020-10-21 DIAGNOSIS — Z1159 Encounter for screening for other viral diseases: Secondary | ICD-10-CM

## 2020-10-21 LAB — POCT GLYCOSYLATED HEMOGLOBIN (HGB A1C)
HbA1c POC (<> result, manual entry): 7.4 % (ref 4.0–5.6)
HbA1c, POC (controlled diabetic range): 7.4 % — AB (ref 0.0–7.0)
HbA1c, POC (prediabetic range): 7.4 % — AB (ref 5.7–6.4)
Hemoglobin A1C: 7.4 % — AB (ref 4.0–5.6)

## 2020-10-21 MED ORDER — POTASSIUM CHLORIDE CRYS ER 20 MEQ PO TBCR: 20.0000 meq | EXTENDED_RELEASE_TABLET | Freq: Two times a day (BID) | ORAL | 3 refills | Status: DC

## 2020-10-21 MED ORDER — BLOOD GLUCOSE METER KIT: PACK | 0 refills | Status: DC

## 2020-10-21 MED ORDER — MOMETASONE FURO-FORMOTEROL FUM 100-5 MCG/ACT IN AERO: 2.0000 | INHALATION_SPRAY | Freq: Two times a day (BID) | RESPIRATORY_TRACT | 2 refills | Status: DC

## 2020-10-21 MED ORDER — QUETIAPINE FUMARATE 100 MG PO TABS: 100.0000 mg | ORAL_TABLET | Freq: Two times a day (BID) | ORAL | 3 refills | Status: DC

## 2020-10-21 MED ORDER — FLUCONAZOLE 150 MG PO TABS: 150.0000 mg | ORAL_TABLET | Freq: Once | ORAL | 0 refills | Status: AC

## 2020-10-21 MED ORDER — GABAPENTIN 100 MG PO CAPS: 200.0000 mg | ORAL_CAPSULE | Freq: Three times a day (TID) | ORAL | 3 refills | Status: DC

## 2020-10-21 MED ORDER — PANTOPRAZOLE SODIUM 40 MG PO TBEC: 40.0000 mg | DELAYED_RELEASE_TABLET | Freq: Every day | ORAL | 3 refills | Status: DC

## 2020-10-21 MED ORDER — INSULIN SYRINGES (DISPOSABLE) U-100 0.5 ML MISC: 1.0000 | Freq: Three times a day (TID) | 11 refills | Status: AC

## 2020-10-21 MED ORDER — TRIAMCINOLONE ACETONIDE 0.5 % EX OINT: 1.0000 "application " | TOPICAL_OINTMENT | Freq: Two times a day (BID) | CUTANEOUS | 0 refills | Status: DC

## 2020-10-21 MED ORDER — METOPROLOL TARTRATE 25 MG PO TABS: 12.5000 mg | ORAL_TABLET | Freq: Two times a day (BID) | ORAL | 3 refills | Status: DC

## 2020-10-21 MED ORDER — LANTUS 100 UNIT/ML ~~LOC~~ SOLN: 32.0000 [IU] | Freq: Two times a day (BID) | SUBCUTANEOUS | 3 refills | Status: DC

## 2020-10-21 MED ORDER — ALLOPURINOL 100 MG PO TABS: 100.0000 mg | ORAL_TABLET | Freq: Every day | ORAL | 3 refills | Status: DC

## 2020-10-21 MED ORDER — ATORVASTATIN CALCIUM 10 MG PO TABS: 10.0000 mg | ORAL_TABLET | Freq: Every day | ORAL | 3 refills | Status: DC

## 2020-10-21 MED ORDER — IPRATROPIUM-ALBUTEROL 0.5-2.5 (3) MG/3ML IN SOLN: 3.0000 mL | Freq: Four times a day (QID) | RESPIRATORY_TRACT | 1 refills | Status: DC | PRN

## 2020-10-21 MED ORDER — SERTRALINE HCL 100 MG PO TABS: 100.0000 mg | ORAL_TABLET | Freq: Every day | ORAL | 3 refills | Status: DC

## 2020-10-21 MED ORDER — TORSEMIDE 10 MG PO TABS: 10.0000 mg | ORAL_TABLET | Freq: Every day | ORAL | 3 refills | Status: DC

## 2020-10-21 NOTE — Progress Notes (Signed)
Timothy Le, Timothy Le  [id number] Phone:  [phone]   Fax:  [phone]   Established Patient Office Visit  Subjective:  Patient ID: Timothy Le, male    DOB: [birth year]  Age: 60 y.o. MRN: [id number]  CC:  Chief Complaint  Patient presents with   Diabetes    6 month follow up;Diabetes. Pt states that he has been having trouble breathing x 1 month. Pt states that he has been having trouble getting his medications. Pt states that his left leg has been swelling again and he went to the vein center a last week and an MRI was done. Pt states that his results were good but he needs to make sure he stays on his fluid medications. Pt states that he has been out of some his medications for 4  days now.    HPI Timothy Le presents for follow up. He  has a past medical history of Asthma, Chronic respiratory failure with hypoxia ([place]) (07/09/2017), COPD (chronic obstructive pulmonary disease) ([place]), Diabetes mellitus without complication ([place]), Difficult intubation, Hypertension, Shortness of breath dyspnea, and Sleep apnea.   Hypertension Patient is here for follow-up of elevated blood pressure. He reports being out of his medication for 3 days due to his Rx expiring. He missed his virtual visit on Friday. . Cardiac symptoms: lower extremity edema. Patient denies chest pain and irregular heart beat. Cardiovascular risk factors: advanced age (older than 69 for men, 35 for women), diabetes mellitus, dyslipidemia, hypertension, male gender, obesity (BMI >= 30 kg/m2), and sedentary lifestyle. Use of agents associated with hypertension: none. History of target organ damage: heart failure. He has noticed a floater in his right.   Diabetes Mellitus Patient presents with new onset of Type 2 diabetes. Current symptoms include: visual disturbances. Symptoms have progressed to a point and plateaued. Patient denies foot ulcerations and hypoglycemia . Evaluation  to date has included: fasting blood sugar, fasting lipid panel, and hemoglobin A1C.  Home sugars: patient does not check sugars. Current treatment: Continued insulin which has been effective and Continued statin which has been effective. Last dilated eye exam: Referral needed.  Past Medical History:  Diagnosis Date   Asthma    Chronic respiratory failure with hypoxia ([place]) 07/09/2017   COPD (chronic obstructive pulmonary disease) ([facility])    Diabetes mellitus without complication ([place])    Difficult intubation    Hypertension    Shortness of breath dyspnea    Sleep apnea     Past Surgical History:  Procedure Laterality Date   INTRAVASCULAR ULTRASOUND/IVUS Left 12/11/2019   Procedure: Intravascular Ultrasound/IVUS;  Surgeon: Waynetta Sandy, MD;  Location: [facility];  Service: Cardiovascular;  Laterality: Left;  Left lower extremity venous   IR GASTROSTOMY TUBE MOD SED  07/05/2017   PERIPHERAL VASCULAR THROMBECTOMY Left 12/11/2019   Procedure: PERIPHERAL VASCULAR THROMBECTOMY;  Surgeon: Waynetta Sandy, MD;  Location: [facility];  Service: Cardiovascular;  Laterality: Left;   THYROIDECTOMY Left 08/30/2019   Procedure: THYROIDECTOMY CERVICAL LIPECTOMY;  Surgeon: Izora Gala, MD;  Location: [facility];  Service: ENT;  Laterality: Left;  NEEDS RNFA PLEASE   TRACHEOSTOMY TUBE PLACEMENT N/A 06/17/2017   Procedure: TRACHEOSTOMY;  Surgeon: Leta Baptist, MD;  Location: [facility];  Service: ENT;  Laterality: N/A;   TRACHEOSTOMY TUBE PLACEMENT N/A 08/14/2019   Procedure: TRACHEOSTOMY;  Surgeon: Izora Gala, MD;  Location: [facility];  Service: ENT;  Laterality: N/A;   TRACHEOSTOMY  TUBE PLACEMENT N/A 08/30/2019   Procedure: TRACHEOSTOMY  REVISION;  Surgeon: Izora Gala, MD;  Location: [facility];  Service: ENT;  Laterality: N/A;    Family History  Problem Relation Age of Onset   Asthma Sister    Asthma Other        nephew   Hypertension Sister    Diabetes type II Sister     Social  History   Socioeconomic History   Marital status: Legally Separated    Spouse name: Not on file   Number of children: 1   Years of education: Not on file   Highest education level: Not on file  Occupational History   Occupation: unemployed    Comment: working on disablity  Tobacco Use   Smoking status: Former    Packs/day: 0.50    Years: 0.50    Pack years: 0.25    Types: Cigarettes    Start date: 07/28/1983    Quit date: 02/27/2013    Years since quitting: 7.6   Smokeless tobacco: Never  Vaping Use   Vaping Use: Never used  Substance and Sexual Activity   Alcohol use: No    Comment: quit ETOH about 5 months ago-beer and liquor   Drug use: No    Comment: quit 6 months-"pot"-smoked couple joints a week   Sexual activity: Not Currently  Other Topics Concern   Not on file  Social History Narrative   Not on file   Social Determinants of Health   Financial Resource Strain: Not on file  Food Insecurity: Not on file  Transportation Needs: Not on file  Physical Activity: Not on file  Stress: Not on file  Social Connections: Not on file  Intimate Partner Violence: Not on file    Outpatient Medications Prior to Visit  Medication Sig Dispense Refill   acetaminophen (TYLENOL) 325 MG tablet Take 1-2 tablets (325-650 mg total) by mouth every 4 (four) hours as needed for mild pain.     BD INSULIN SYRINGE U/F 31G X 5/16" 0.5 ML MISC USE FOR DIABETIC TESTING     diclofenac Sodium (VOLTAREN) 1 % GEL Apply 2 g topically 4 (four) times daily. 150 g 5   traZODone (DESYREL) 50 MG tablet TAKE 0.5-1 TABLETS (25-50 MG TOTAL) BY MOUTH AT BEDTIME AS NEEDED FOR SLEEP. 15 tablet 11   Insulin Syringes, Disposable, U-100 0.5 ML MISC 1 Syringe by Does not apply route 3 (three) times daily after meals. 100 each 11   LANTUS 100 UNIT/ML injection Inject 0.32 mLs (32 Units total) into the skin 2 (two) times daily. 57.6 mL 3   mometasone-formoterol (DULERA) 100-5 MCG/ACT AERO Inhale 2 puffs into the  lungs 2 (two) times daily. 1 each 0   pantoprazole (PROTONIX) 40 MG tablet Take 1 tablet by mouth at bedtime.     sertraline (ZOLOFT) 100 MG tablet TAKE 1 TABLET BY MOUTH EVERY DAY 90 tablet 3   alum & mag hydroxide-simeth (MAALOX/MYLANTA) 200-200-20 MG/5ML suspension Take 30 mLs by mouth every 4 (four) hours as needed for indigestion, heartburn or flatulence. (Patient not taking: No sig reported) 355 mL 0   nutrition supplement, JUVEN, (JUVEN) PACK Take 1 packet by mouth 2 (two) times daily between meals. (Patient not taking: No sig reported) 60 packet 0   allopurinol (ZYLOPRIM) 100 MG tablet Take 1 tablet (100 mg total) by mouth daily. 90 tablet 3   atorvastatin (LIPITOR) 10 MG tablet Take 1 tablet (10 mg total) by mouth daily. 90 tablet 3  gabapentin (NEURONTIN) 100 MG capsule Take 2 capsules (200 mg total) by mouth 3 (three) times daily. 540 capsule 3   ipratropium-albuterol (DUONEB) 0.5-2.5 (3) MG/3ML SOLN Take 3 mLs by nebulization every 6 (six) hours as needed. 360 mL    metoprolol tartrate (LOPRESSOR) 25 MG tablet Take 0.5 tablets (12.5 mg total) by mouth 2 (two) times daily. 90 tablet 3   potassium chloride SA (KLOR-CON) 20 MEQ tablet Take 1 tablet (20 mEq total) by mouth 2 (two) times daily. 180 tablet 3   QUEtiapine (SEROQUEL) 100 MG tablet Take 1 tablet (100 mg total) by mouth 2 (two) times daily. 180 tablet 3   torsemide (DEMADEX) 10 MG tablet Take 1 tablet (10 mg total) by mouth daily. 90 tablet 3   No facility-administered medications prior to visit.    No Known Allergies  ROS Review of Systems    Objective:    Physical Exam Constitutional:      Appearance: He is obese.  HENT:     Head: Normocephalic and atraumatic.  Cardiovascular:     Rate and Rhythm: Normal rate and regular rhythm.     Pulses: Normal pulses.     Heart sounds: Normal heart sounds.  Pulmonary:     Effort: Pulmonary effort is normal.     Breath sounds: Normal breath sounds.     Comments:  trach Musculoskeletal:     Cervical back: Normal range of motion.     Right lower leg: No edema.     Left lower leg: No edema.  Skin:    General: Skin is warm and dry.     Capillary Refill: Capillary refill takes less than 2 seconds.  Neurological:     General: No focal deficit present.     Mental Status: He is alert and oriented to person, place, and time.  Psychiatric:        Mood and Affect: Mood normal.        Behavior: Behavior normal.        Thought Content: Thought content normal.        Judgment: Judgment normal.    BP (!) 154/74   Pulse 92   Temp (!) 97.5 F (36.4 C)   Ht '5\' 8"'  (1.727 m)   Wt (!) 387 lb 9.6 oz (175.8 kg)   SpO2 94%   BMI 58.93 kg/m  Wt Readings from Last 3 Encounters:  10/21/20 (!) 387 lb 9.6 oz (175.8 kg)  10/11/20 (!) 374 lb (169.6 kg)  04/11/20 (!) 336 lb (152.4 kg)     Health Maintenance Due  Topic Date Due   COVID-19 Vaccine (1) Never done   OPHTHALMOLOGY EXAM  Never done   INFLUENZA VACCINE  08/19/2020   FOOT EXAM  09/26/2020   URINE MICROALBUMIN  10/11/2020    There are no preventive care reminders to display for this patient.  Lab Results  Component Value Date   TSH 0.434 08/07/2019   Lab Results  Component Value Date   WBC 6.0 12/13/2019   HGB 10.8 (L) 12/13/2019   HCT 35.1 (L) 12/13/2019   MCV 85.2 12/13/2019   PLT 186 12/13/2019   Lab Results  Component Value Date   NA 143 04/11/2020   K 4.1 04/11/2020   CO2 28 12/13/2019   GLUCOSE 140 (H) 04/11/2020   BUN 15 04/11/2020   CREATININE 1.18 04/11/2020   BILITOT 0.4 04/11/2020   ALKPHOS 137 (H) 04/11/2020   AST 21 04/11/2020   ALT 34 09/16/2019  PROT 7.9 04/11/2020   ALBUMIN 4.0 04/11/2020   CALCIUM 9.5 04/11/2020   ANIONGAP 12 12/13/2019   EGFR 72 04/11/2020   GFR 85.97 01/22/2016   Lab Results  Component Value Date   CHOL 159 10/12/2019   Lab Results  Component Value Date   HDL 45 10/12/2019   Lab Results  Component Value Date   LDLCALC 94  10/12/2019   Lab Results  Component Value Date   TRIG 108 10/12/2019   Lab Results  Component Value Date   CHOLHDL 3.5 10/12/2019   Lab Results  Component Value Date   HGBA1C 7.4 (A) 10/21/2020   HGBA1C 7.4 10/21/2020   HGBA1C 7.4 (A) 10/21/2020   HGBA1C 7.4 (A) 10/21/2020      Assessment & Plan:   Problem List Items Addressed This Visit       Cardiovascular and Mediastinum   Benign essential HTN Stable  Encouraged on going compliance with current medication regimen Encouraged home monitoring and recording BP <130/80 Eating a heart-healthy diet with less salt Encouraged regular physical activity  Recommend Weight loss   Relevant Medications   atorvastatin (LIPITOR) 10 MG tablet   metoprolol tartrate (LOPRESSOR) 25 MG tablet   torsemide (DEMADEX) 10 MG tablet   Other Relevant Orders   Comp. Metabolic Panel (12)     Endocrine   Type 2 diabetes mellitus with hyperosmolar nonketotic hyperglycemia (HCC) - Primary Controlled Encourage compliance with current treatment regimen   Encourage regular CBG monitoring Encourage contacting office if excessive hyperglycemia and or hypoglycemia Lifestyle modification with healthy diet (fewer calories, more high fiber foods, whole grains and non-starchy vegetables, lower fat meat and fish, low-fat diary include healthy oils) regular exercise (physical activity) and weight loss Opthalmology exam discussed  Nutritional consult recommended Regular dental visits encouraged Home BP monitoring also encouraged goal <130/80    Relevant Medications   LANTUS 100 UNIT/ML injection   atorvastatin (LIPITOR) 10 MG tablet   Other Relevant Orders   HgB A1c (Completed)   Comp. Metabolic Panel (12)   Ambulatory referral to Ophthalmology   Microalbumin, urine     Other   Tracheostomy dependence ([place]) Trach collar changed with out difficulty    Hyperlipidemia Heart healthy diet    Relevant Medications   atorvastatin (LIPITOR) 10 MG tablet    metoprolol tartrate (LOPRESSOR) 25 MG tablet   torsemide (DEMADEX) 10 MG tablet   Other Relevant Orders   Lipid panel   Other Visit Diagnoses     Need for influenza vaccination       Encounter for hepatitis C screening test for low risk patient       Relevant Orders   Hepatitis C antibody   PTSD (post-traumatic stress disorder) Continues to have fear with dark to previous incident    Meds ordered this encounter  Medications   Insulin Syringes, Disposable, U-100 0.5 ML MISC    Sig: 1 Syringe by Does not apply route 3 (three) times daily after meals.    Dispense:  100 each    Refill:  11    Order Specific Question:   Supervising Provider    Answer:   Tresa Garter [[id number]]   LANTUS 100 UNIT/ML injection    Sig: Inject 0.32 mLs (32 Units total) into the skin 2 (two) times daily.    Dispense:  57.6 mL    Refill:  3    Order Specific Question:   Supervising Provider    Answer:   Tresa Garter [id number]  mometasone-formoterol (DULERA) 100-5 MCG/ACT AERO    Sig: Inhale 2 puffs into the lungs 2 (two) times daily for 8 days.    Dispense:  8 g    Refill:  2    Order Specific Question:   Supervising Provider    Answer:   Tresa Garter [[id number]]   pantoprazole (PROTONIX) 40 MG tablet    Sig: Take 1 tablet (40 mg total) by mouth at bedtime.    Dispense:  90 tablet    Refill:  3    Order Specific Question:   Supervising Provider    Answer:   Tresa Garter [[id number]]   sertraline (ZOLOFT) 100 MG tablet    Sig: Take 1 tablet (100 mg total) by mouth daily.    Dispense:  90 tablet    Refill:  3    Order Specific Question:   Supervising Provider    Answer:   Tresa Garter [[id number]]   allopurinol (ZYLOPRIM) 100 MG tablet    Sig: Take 1 tablet (100 mg total) by mouth daily.    Dispense:  90 tablet    Refill:  3    Order Specific Question:   Supervising Provider    Answer:   Tresa Garter [[id number]]   atorvastatin (LIPITOR) 10 MG tablet     Sig: Take 1 tablet (10 mg total) by mouth daily.    Dispense:  90 tablet    Refill:  3    Order Specific Question:   Supervising Provider    Answer:   Tresa Garter [[id number]]   gabapentin (NEURONTIN) 100 MG capsule    Sig: Take 2 capsules (200 mg total) by mouth 3 (three) times daily.    Dispense:  540 capsule    Refill:  3    Order Specific Question:   Supervising Provider    Answer:   Tresa Garter [[id number]]   ipratropium-albuterol (DUONEB) 0.5-2.5 (3) MG/3ML SOLN    Sig: Take 3 mLs by nebulization every 6 (six) hours as needed.    Dispense:  360 mL    Refill:  1    Order Specific Question:   Supervising Provider    Answer:   Tresa Garter [[id number]]   metoprolol tartrate (LOPRESSOR) 25 MG tablet    Sig: Take 0.5 tablets (12.5 mg total) by mouth 2 (two) times daily.    Dispense:  90 tablet    Refill:  3    Order Specific Question:   Supervising Provider    Answer:   Tresa Garter [[id number]]   potassium chloride SA (KLOR-CON) 20 MEQ tablet    Sig: Take 1 tablet (20 mEq total) by mouth 2 (two) times daily.    Dispense:  180 tablet    Refill:  3    Order Specific Question:   Supervising Provider    Answer:   Tresa Garter [[id number]]   QUEtiapine (SEROQUEL) 100 MG tablet    Sig: Take 1 tablet (100 mg total) by mouth 2 (two) times daily.    Dispense:  180 tablet    Refill:  3    Order Specific Question:   Supervising Provider    Answer:   Tresa Garter [[id number]]   torsemide (DEMADEX) 10 MG tablet    Sig: Take 1 tablet (10 mg total) by mouth daily.    Dispense:  90 tablet    Refill:  3    Order Specific Question:   Supervising Provider    Answer:  JEGEDE, OLUGBEMIGA E [[id number]]   blood glucose meter kit and supplies    Sig: Dispense based on patient and insurance preference. Use up to four times daily as directed. (FOR ICD-10 E10.9, E11.9).    Dispense:  1 each    Refill:  0    Order Specific Question:   Supervising Provider    Answer:    Tresa Garter [id number]    Order Specific Question:   Number of strips    Answer:   100    Order Specific Question:   Number of lancets    Answer:   100   fluconazole (DIFLUCAN) 150 MG tablet    Sig: Take 1 tablet (150 mg total) by mouth once for 1 dose.    Dispense:  1 tablet    Refill:  0    Order Specific Question:   Supervising Provider    Answer:   Tresa Garter [id number]   triamcinolone ointment (KENALOG) 0.5 %    Sig: Apply 1 application topically 2 (two) times daily.    Dispense:  30 g    Refill:  0    Order Specific Question:   Supervising Provider    Answer:   Tresa Garter [id number]    Follow-up: Return in about 3 months (around 01/21/2021) for follow up DM [id number], Follow up HTN [id number].    Vevelyn Francois, NP

## 2020-10-21 NOTE — Patient Instructions (Signed)
Managing Your Hypertension Hypertension, also called high blood pressure, is when the force of the blood pressing against the walls of the arteries is too strong. Arteries are blood vessels that carry blood from your heart throughout your body. Hypertension forces the heart to work harder to pump blood and may cause the arteries to become narrow or stiff. Understanding blood pressure readings Your personal target blood pressure may vary depending on your medical conditions, your age, and other factors. A blood pressure reading includes a higher number over a lower number. Ideally, your blood pressure should be below 120/80. You should know that: The first, or top, number is called the systolic pressure. It is a measure of the pressure in your arteries as your heart beats. The second, or bottom number, is called the diastolic pressure. It is a measure of the pressure in your arteries as the heart relaxes. Blood pressure is classified into four stages. Based on your blood pressure reading, your health care provider may use the following stages to determine what type of treatment you need, if any. Systolic pressure and diastolic pressure are measured in a unit called mmHg. Normal Systolic pressure: below 120. Diastolic pressure: below 80. Elevated Systolic pressure: 120-129. Diastolic pressure: below 80. Hypertension stage 1 Systolic pressure: 130-139. Diastolic pressure: 80-89. Hypertension stage 2 Systolic pressure: 140 or above. Diastolic pressure: 90 or above. How can this condition affect me? Managing your hypertension is an important responsibility. Over time, hypertension can damage the arteries and decrease blood flow to important parts of the body, including the brain, heart, and kidneys. Having untreated or uncontrolled hypertension can lead to: A heart attack. A stroke. A weakened blood vessel (aneurysm). Heart failure. Kidney damage. Eye damage. Metabolic syndrome. Memory and  concentration problems. Vascular dementia. What actions can I take to manage this condition? Hypertension can be managed by making lifestyle changes and possibly by taking medicines. Your health care provider will help you make a plan to bring your blood pressure within a normal range. Nutrition  Eat a diet that is high in fiber and potassium, and low in salt (sodium), added sugar, and fat. An example eating plan is called the Dietary Approaches to Stop Hypertension (DASH) diet. To eat this way: Eat plenty of fresh fruits and vegetables. Try to fill one-half of your plate at each meal with fruits and vegetables. Eat whole grains, such as whole-wheat pasta, brown rice, or whole-grain bread. Fill about one-fourth of your plate with whole grains. Eat low-fat dairy products. Avoid fatty cuts of meat, processed or cured meats, and poultry with skin. Fill about one-fourth of your plate with lean proteins such as fish, chicken without skin, beans, eggs, and tofu. Avoid pre-made and processed foods. These tend to be higher in sodium, added sugar, and fat. Reduce your daily sodium intake. Most people with hypertension should eat less than 1,500 mg of sodium a day. Lifestyle  Work with your health care provider to maintain a healthy body weight or to lose weight. Ask what an ideal weight is for you. Get at least 30 minutes of exercise that causes your heart to beat faster (aerobic exercise) most days of the week. Activities may include walking, swimming, or biking. Include exercise to strengthen your muscles (resistance exercise), such as weight lifting, as part of your weekly exercise routine. Try to do these types of exercises for 30 minutes at least 3 days a week. Do not use any products that contain nicotine or tobacco, such as cigarettes, e-cigarettes,   and chewing tobacco. If you need help quitting, ask your health care provider. Control any long-term (chronic) conditions you have, such as high  cholesterol or diabetes. Identify your sources of stress and find ways to manage stress. This may include meditation, deep breathing, or making time for fun activities. Alcohol use Do not drink alcohol if: Your health care provider tells you not to drink. You are pregnant, may be pregnant, or are planning to become pregnant. If you drink alcohol: Limit how much you use to: 0-1 drink a day for women. 0-2 drinks a day for men. Be aware of how much alcohol is in your drink. In the [place] one drink equals one 12 oz bottle of beer (355 mL), one 5 oz glass of wine (148 mL), or one 1 oz glass of hard liquor (44 mL). Medicines Your health care provider may prescribe medicine if lifestyle changes are not enough to get your blood pressure under control and if: Your systolic blood pressure is 130 or higher. Your diastolic blood pressure is 80 or higher. Take medicines only as told by your health care provider. Follow the directions carefully. Blood pressure medicines must be taken as told by your health care provider. The medicine does not work as well when you skip doses. Skipping doses also puts you at risk for problems. Monitoring Before you monitor your blood pressure: Do not smoke, drink caffeinated beverages, or exercise within 30 minutes before taking a measurement. Use the bathroom and empty your bladder (urinate). Sit quietly for at least 5 minutes before taking measurements. Monitor your blood pressure at home as told by your health care provider. To do this: Sit with your back straight and supported. Place your feet flat on the floor. Do not cross your legs. Support your arm on a flat surface, such as a table. Make sure your upper arm is at heart level. Each time you measure, take two or three readings one minute apart and record the results. You may also need to have your blood pressure checked regularly by your health care provider. General information Talk with your health care  provider about your diet, exercise habits, and other lifestyle factors that may be contributing to hypertension. Review all the medicines you take with your health care provider because there may be side effects or interactions. Keep all visits as told by your health care provider. Your health care provider can help you create and adjust your plan for managing your high blood pressure. Where to find more information National Heart, Lung, and Blood Institute: www.nhlbi.nih.gov American Heart Association: www.heart.org Contact a health care provider if: You think you are having a reaction to medicines you have taken. You have repeated (recurrent) headaches. You feel dizzy. You have swelling in your ankles. You have trouble with your vision. Get help right away if: You develop a severe headache or confusion. You have unusual weakness or numbness, or you feel faint. You have severe pain in your chest or abdomen. You vomit repeatedly. You have trouble breathing. These symptoms may represent a serious problem that is an emergency. Do not wait to see if the symptoms will go away. Get medical help right away. Call your local emergency services (911 in the [place] Do not drive yourself to the hospital. Summary Hypertension is when the force of blood pumping through your arteries is too strong. If this condition is not controlled, it may put you at risk for serious complications. Your personal target blood pressure may vary depending on   your medical conditions, your age, and other factors. For most people, a normal blood pressure is less than 120/80. Hypertension is managed by lifestyle changes, medicines, or both. Lifestyle changes to help manage hypertension include losing weight, eating a healthy, low-sodium diet, exercising more, stopping smoking, and limiting alcohol. This information is not intended to replace advice given to you by your health care provider. Make sure you discuss any questions  you have with your health care provider. Document Revised: 02/10/2019 Document Reviewed: 12/06/2018 Elsevier Patient Education  2022 [employer].  Diabetes Mellitus and Nutrition, Adult When you have diabetes, or diabetes mellitus, it is very important to have healthy eating habits because your blood sugar (glucose) levels are greatly affected by what you eat and drink. Eating healthy foods in the right amounts, at about the same times every day, can help you: Control your blood glucose. Lower your risk of heart disease. Improve your blood pressure. Reach or maintain a healthy weight. What can affect my meal plan? Every person with diabetes is different, and each person has different needs for a meal plan. Your health care provider may recommend that you work with a dietitian to make a meal plan that is best for you. Your meal plan may vary depending on factors such as: The calories you need. The medicines you take. Your weight. Your blood glucose, blood pressure, and cholesterol levels. Your activity level. Other health conditions you have, such as heart or kidney disease. How do carbohydrates affect me? Carbohydrates, also called carbs, affect your blood glucose level more than any other type of food. Eating carbs naturally raises the amount of glucose in your blood. Carb counting is a method for keeping track of how many carbs you eat. Counting carbs is important to keep your blood glucose at a healthy level, especially if you use insulin or take certain oral diabetes medicines. It is important to know how many carbs you can safely have in each meal. This is different for every person. Your dietitian can help you calculate how many carbs you should have at each meal and for each snack. How does alcohol affect me? Alcohol can cause a sudden decrease in blood glucose (hypoglycemia), especially if you use insulin or take certain oral diabetes medicines. Hypoglycemia can be a life-threatening  condition. Symptoms of hypoglycemia, such as sleepiness, dizziness, and confusion, are similar to symptoms of having too much alcohol. Do not drink alcohol if: Your health care provider tells you not to drink. You are pregnant, may be pregnant, or are planning to become pregnant. If you drink alcohol: Do not drink on an empty stomach. Limit how much you use to: 0-1 drink a day for women. 0-2 drinks a day for men. Be aware of how much alcohol is in your drink. In the [place] one drink equals one 12 oz bottle of beer (355 mL), one 5 oz glass of wine (148 mL), or one 1 oz glass of hard liquor (44 mL). Keep yourself hydrated with water, diet soda, or unsweetened iced tea. Keep in mind that regular soda, juice, and other mixers may contain a lot of sugar and must be counted as carbs. What are tips for following this plan? Reading food labels Start by checking the serving size on the "Nutrition Facts" label of packaged foods and drinks. The amount of calories, carbs, fats, and other nutrients listed on the label is based on one serving of the item. Many items contain more than one serving per package. Check  the total grams (g) of carbs in one serving. You can calculate the number of servings of carbs in one serving by dividing the total carbs by 15. For example, if a food has 30 g of total carbs per serving, it would be equal to 2 servings of carbs. Check the number of grams (g) of saturated fats and trans fats in one serving. Choose foods that have a low amount or none of these fats. Check the number of milligrams (mg) of salt (sodium) in one serving. Most people should limit total sodium intake to less than 2,300 mg per day. Always check the nutrition information of foods labeled as "low-fat" or "nonfat." These foods may be higher in added sugar or refined carbs and should be avoided. Talk to your dietitian to identify your daily goals for nutrients listed on the label. Shopping Avoid buying canned,  pre-made, or processed foods. These foods tend to be high in fat, sodium, and added sugar. Shop around the outside edge of the grocery store. This is where you will most often find fresh fruits and vegetables, bulk grains, fresh meats, and fresh dairy. Cooking Use low-heat cooking methods, such as baking, instead of high-heat cooking methods like deep frying. Cook using healthy oils, such as olive, canola, or sunflower oil. Avoid cooking with butter, cream, or high-fat meats. Meal planning Eat meals and snacks regularly, preferably at the same times every day. Avoid going long periods of time without eating. Eat foods that are high in fiber, such as fresh fruits, vegetables, beans, and whole grains. Talk with your dietitian about how many servings of carbs you can eat at each meal. Eat 4-6 oz (112-168 g) of lean protein each day, such as lean meat, chicken, fish, eggs, or tofu. One ounce (oz) of lean protein is equal to: 1 oz (28 g) of meat, chicken, or fish. 1 egg.  cup (62 g) of tofu. Eat some foods each day that contain healthy fats, such as avocado, nuts, seeds, and fish. What foods should I eat? Fruits Berries. Apples. Oranges. Peaches. Apricots. Plums. Grapes. Mango. Papaya. Pomegranate. Kiwi. Cherries. Vegetables Lettuce. Spinach. Leafy greens, including kale, chard, collard greens, and mustard greens. Beets. Cauliflower. Cabbage. Broccoli. Carrots. Green beans. Tomatoes. Peppers. Onions. Cucumbers. Brussels sprouts. Grains Whole grains, such as whole-wheat or whole-grain bread, crackers, tortillas, cereal, and pasta. Unsweetened oatmeal. Quinoa. Brown or wild rice. Meats and other proteins Seafood. Poultry without skin. Lean cuts of poultry and beef. Tofu. Nuts. Seeds. Dairy Low-fat or fat-free dairy products such as milk, yogurt, and cheese. The items listed above may not be a complete list of foods and beverages you can eat. Contact a dietitian for more information. What foods  should I avoid? Fruits Fruits canned with syrup. Vegetables Canned vegetables. Frozen vegetables with butter or cream sauce. Grains Refined white flour and flour products such as bread, pasta, snack foods, and cereals. Avoid all processed foods. Meats and other proteins Fatty cuts of meat. Poultry with skin. Breaded or fried meats. Processed meat. Avoid saturated fats. Dairy Full-fat yogurt, cheese, or milk. Beverages Sweetened drinks, such as soda or iced tea. The items listed above may not be a complete list of foods and beverages you should avoid. Contact a dietitian for more information. Questions to ask a health care provider Do I need to meet with a diabetes educator? Do I need to meet with a dietitian? What number can I call if I have questions? When are the best times to check my  blood glucose? Where to find more information: American Diabetes Association: diabetes.org Academy of Nutrition and Dietetics: www.eatright.[employer] of Diabetes and Digestive and Kidney Diseases: [employer] Association of Diabetes Care and Education Specialists: www.diabeteseducator.org Summary It is important to have healthy eating habits because your blood sugar (glucose) levels are greatly affected by what you eat and drink. A healthy meal plan will help you control your blood glucose and maintain a healthy lifestyle. Your health care provider may recommend that you work with a dietitian to make a meal plan that is best for you. Keep in mind that carbohydrates (carbs) and alcohol have immediate effects on your blood glucose levels. It is important to count carbs and to use alcohol carefully. This information is not intended to replace advice given to you by your health care provider. Make sure you discuss any questions you have with your health care provider. Document Revised: 12/13/2018 Document Reviewed: 12/13/2018 Elsevier Patient Education  2021 [employer].

## 2020-10-22 LAB — MICROALBUMIN, URINE: Microalbumin, Urine: 38.4 ug/mL

## 2021-01-07 ENCOUNTER — Encounter (HOSPITAL_COMMUNITY): Payer: Self-pay

## 2021-01-07 ENCOUNTER — Other Ambulatory Visit: Payer: Self-pay

## 2021-01-07 ENCOUNTER — Emergency Department (HOSPITAL_COMMUNITY): Payer: Medicaid Other

## 2021-01-07 ENCOUNTER — Inpatient Hospital Stay (HOSPITAL_COMMUNITY)
Admission: EM | Admit: 2021-01-07 | Discharge: 2021-01-11 | DRG: 271 | Disposition: A | Discharge status: Home / Self Care | Payer: Medicaid Other | Attending: Internal Medicine | Admitting: Internal Medicine

## 2021-01-07 ENCOUNTER — Emergency Department (HOSPITAL_BASED_OUTPATIENT_CLINIC_OR_DEPARTMENT_OTHER)
Admit: 2021-01-07 | Discharge: 2021-01-07 | Disposition: A | Discharge status: Home / Self Care | Payer: Medicaid Other | Attending: Emergency Medicine | Admitting: Emergency Medicine

## 2021-01-07 ENCOUNTER — Emergency Department (HOSPITAL_BASED_OUTPATIENT_CLINIC_OR_DEPARTMENT_OTHER): Payer: Medicaid Other

## 2021-01-07 DIAGNOSIS — Z794 Long term (current) use of insulin: Secondary | ICD-10-CM

## 2021-01-07 DIAGNOSIS — R609 Edema, unspecified: Secondary | ICD-10-CM | POA: Diagnosis not present

## 2021-01-07 DIAGNOSIS — M7989 Other specified soft tissue disorders: Secondary | ICD-10-CM | POA: Diagnosis present

## 2021-01-07 DIAGNOSIS — J9611 Chronic respiratory failure with hypoxia: Secondary | ICD-10-CM | POA: Diagnosis present

## 2021-01-07 DIAGNOSIS — I82412 Acute embolism and thrombosis of left femoral vein: Principal | ICD-10-CM | POA: Diagnosis present

## 2021-01-07 DIAGNOSIS — I82422 Acute embolism and thrombosis of left iliac vein: Secondary | ICD-10-CM | POA: Diagnosis present

## 2021-01-07 DIAGNOSIS — Z20822 Contact with and (suspected) exposure to covid-19: Secondary | ICD-10-CM | POA: Diagnosis present

## 2021-01-07 DIAGNOSIS — E1142 Type 2 diabetes mellitus with diabetic polyneuropathy: Secondary | ICD-10-CM | POA: Diagnosis present

## 2021-01-07 DIAGNOSIS — G4733 Obstructive sleep apnea (adult) (pediatric): Secondary | ICD-10-CM | POA: Diagnosis present

## 2021-01-07 DIAGNOSIS — E1165 Type 2 diabetes mellitus with hyperglycemia: Secondary | ICD-10-CM | POA: Diagnosis present

## 2021-01-07 DIAGNOSIS — Z6841 Body Mass Index (BMI) 40.0 and over, adult: Secondary | ICD-10-CM | POA: Diagnosis not present

## 2021-01-07 DIAGNOSIS — I82432 Acute embolism and thrombosis of left popliteal vein: Secondary | ICD-10-CM | POA: Diagnosis present

## 2021-01-07 DIAGNOSIS — J449 Chronic obstructive pulmonary disease, unspecified: Secondary | ICD-10-CM | POA: Diagnosis present

## 2021-01-07 DIAGNOSIS — Z7951 Long term (current) use of inhaled steroids: Secondary | ICD-10-CM

## 2021-01-07 DIAGNOSIS — Z833 Family history of diabetes mellitus: Secondary | ICD-10-CM | POA: Diagnosis not present

## 2021-01-07 DIAGNOSIS — I824Y2 Acute embolism and thrombosis of unspecified deep veins of left proximal lower extremity: Secondary | ICD-10-CM | POA: Diagnosis not present

## 2021-01-07 DIAGNOSIS — Z8249 Family history of ischemic heart disease and other diseases of the circulatory system: Secondary | ICD-10-CM

## 2021-01-07 DIAGNOSIS — Z95820 Peripheral vascular angioplasty status with implants and grafts: Secondary | ICD-10-CM

## 2021-01-07 DIAGNOSIS — Z79899 Other long term (current) drug therapy: Secondary | ICD-10-CM | POA: Diagnosis not present

## 2021-01-07 DIAGNOSIS — I82402 Acute embolism and thrombosis of unspecified deep veins of left lower extremity: Secondary | ICD-10-CM | POA: Diagnosis present

## 2021-01-07 DIAGNOSIS — I5032 Chronic diastolic (congestive) heart failure: Secondary | ICD-10-CM | POA: Diagnosis present

## 2021-01-07 DIAGNOSIS — M79604 Pain in right leg: Secondary | ICD-10-CM

## 2021-01-07 DIAGNOSIS — M79605 Pain in left leg: Secondary | ICD-10-CM | POA: Diagnosis not present

## 2021-01-07 DIAGNOSIS — J439 Emphysema, unspecified: Secondary | ICD-10-CM | POA: Diagnosis not present

## 2021-01-07 DIAGNOSIS — Z86718 Personal history of other venous thrombosis and embolism: Secondary | ICD-10-CM | POA: Diagnosis not present

## 2021-01-07 DIAGNOSIS — Z930 Tracheostomy status: Secondary | ICD-10-CM

## 2021-01-07 DIAGNOSIS — Z7901 Long term (current) use of anticoagulants: Secondary | ICD-10-CM | POA: Diagnosis not present

## 2021-01-07 DIAGNOSIS — J9610 Chronic respiratory failure, unspecified whether with hypoxia or hypercapnia: Secondary | ICD-10-CM | POA: Diagnosis not present

## 2021-01-07 DIAGNOSIS — Z87891 Personal history of nicotine dependence: Secondary | ICD-10-CM

## 2021-01-07 DIAGNOSIS — I110 Hypertensive heart disease with heart failure: Secondary | ICD-10-CM | POA: Diagnosis present

## 2021-01-07 LAB — CBC WITH DIFFERENTIAL/PLATELET
Abs Immature Granulocytes: 0.05 10*3/uL (ref 0.00–0.07)
Basophils Absolute: 0.1 10*3/uL (ref 0.0–0.1)
Basophils Relative: 1 %
Eosinophils Absolute: 0.4 10*3/uL (ref 0.0–0.5)
Eosinophils Relative: 5 %
HCT: 48.2 % (ref 39.0–52.0)
Hemoglobin: 15.4 g/dL (ref 13.0–17.0)
Immature Granulocytes: 1 %
Lymphocytes Relative: 41 %
Lymphs Abs: 3.5 10*3/uL (ref 0.7–4.0)
MCH: 28.4 pg (ref 26.0–34.0)
MCHC: 32 g/dL (ref 30.0–36.0)
MCV: 88.8 fL (ref 80.0–100.0)
Monocytes Absolute: 0.6 10*3/uL (ref 0.1–1.0)
Monocytes Relative: 7 %
Neutro Abs: 4 10*3/uL (ref 1.7–7.7)
Neutrophils Relative %: 45 %
Platelets: 169 10*3/uL (ref 150–400)
RBC: 5.43 MIL/uL (ref 4.22–5.81)
RDW: 14.6 % (ref 11.5–15.5)
WBC: 8.7 10*3/uL (ref 4.0–10.5)
nRBC: 0 % (ref 0.0–0.2)

## 2021-01-07 LAB — BASIC METABOLIC PANEL
Anion gap: 11 (ref 5–15)
BUN: 25 mg/dL — ABNORMAL HIGH (ref 6–20)
CO2: 28 mmol/L (ref 22–32)
Calcium: 9.1 mg/dL (ref 8.9–10.3)
Chloride: 93 mmol/L — ABNORMAL LOW (ref 98–111)
Creatinine, Ser: 1.49 mg/dL — ABNORMAL HIGH (ref 0.61–1.24)
GFR, Estimated: 54 mL/min — ABNORMAL LOW (ref >60)
Glucose, Bld: 509 mg/dL (ref 70–99)
Potassium: 4.5 mmol/L (ref 3.5–5.1)
Sodium: 132 mmol/L — ABNORMAL LOW (ref 135–145)

## 2021-01-07 LAB — RESP PANEL BY RT-PCR (FLU A&B, COVID) ARPGX2
Influenza A by PCR: NEGATIVE
Influenza B by PCR: NEGATIVE
SARS Coronavirus 2 by RT PCR: NEGATIVE

## 2021-01-07 LAB — ANTITHROMBIN III: AntiThromb III Func: 131 % — ABNORMAL HIGH (ref 75–120)

## 2021-01-07 LAB — CBG MONITORING, ED: Glucose-Capillary: 303 mg/dL — ABNORMAL HIGH (ref 70–99)

## 2021-01-07 MED ORDER — INSULIN ASPART 100 UNIT/ML IJ SOLN
8.0000 [IU] | Freq: Once | INTRAMUSCULAR | Status: AC
  Administered 2021-01-07: 19:00:00 8 [IU] via INTRAVENOUS

## 2021-01-07 MED ORDER — HEPARIN BOLUS VIA INFUSION
6000.0000 [IU] | Freq: Once | INTRAVENOUS | Status: AC
  Administered 2021-01-07: 19:00:00 6000 [IU] via INTRAVENOUS
  Filled 2021-01-07: qty 6000

## 2021-01-07 MED ORDER — HEPARIN (PORCINE) 25000 UT/250ML-% IV SOLN
1650.0000 [IU]/h | INTRAVENOUS | Status: DC
  Administered 2021-01-07: 19:00:00 2000 [IU]/h via INTRAVENOUS
  Administered 2021-01-08 (×2): 1850 [IU]/h via INTRAVENOUS
  Administered 2021-01-09 – 2021-01-10 (×2): 1650 [IU]/h via INTRAVENOUS
  Filled 2021-01-07 (×5): qty 250

## 2021-01-07 MED ORDER — IOHEXOL 350 MG/ML SOLN
100.0000 mL | Freq: Once | INTRAVENOUS | Status: AC | PRN
  Administered 2021-01-07: 21:00:00 100 mL via INTRAVENOUS

## 2021-01-07 MED ORDER — SODIUM CHLORIDE 0.9 % IV BOLUS
500.0000 mL | Freq: Once | INTRAVENOUS | Status: AC
  Administered 2021-01-07: 19:00:00 500 mL via INTRAVENOUS

## 2021-01-07 NOTE — Consult Note (Signed)
ASSESSMENT & PLAN   EXTENSIVE LEFT LOWER EXTREMITY DVT: This patient has an extensive left lower extremity DVT which appears to involve the external iliac vein, common femoral vein, femoral vein, and popliteal vein.  The common iliac vein could not be visualized.  The IVC was patent by duplex.  He has a Wallstent in the iliac vein on the left.  For this reason he is likely not a candidate for mechanical thrombectomy as this could cause movement of the stent.  He may be a candidate for thrombolytic therapy.  I will discussed the case with Dr. Donzetta Matters who has worked with [name] in the past.  For now however I would agree with IV heparin.  Certainly the situation is complicated by his obesity.  His BMI is 59.  I have written for leg elevation.  REASON FOR CONSULT:    Left lower extremity DVT.  The consult is requested by Dr. Gilford Raid.   HPI:   Timothy Le is a 60 y.o. male who originally presented to our service in November 2021 with an extensive left lower extremity DVT.  On 12/11/2019 he underwent mechanical thrombectomy of the inferior vena cava, left common and external iliac veins, left common femoral vein, and femoral vein.  He also had a stent placed in the left common iliac vein.  This was a 67m Wallstent.  Patient was placed on Eliquis at that time.  He was last seen in our office on 10/11/2020.  At that time, he states that he had run out of Eliquis 4 months ago.  Duplex scan at the time of his last visit showed no evidence of thrombus in the visualized segments of the IVC, common iliac vein, and external iliac vein.  He was started on aspirin and was not restarted on Eliquis.  On my history, the patient developed the gradual onset of left lower extremity swelling which began 3 to 4 days ago.  Of note he tells me he was not taking aspirin.  He has been off his Eliquis since May of this year I believe.  He does admit to some shortness of breath.  He denies any chest pain.  Past Medical  History:  Diagnosis Date   Asthma    Chronic respiratory failure with hypoxia [place]   COPD (chronic obstructive pulmonary disease) (HCC)    Diabetes mellitus without complication (HCC)    Difficult intubation    Hypertension    Shortness of breath dyspnea    Sleep apnea     Family History  Problem Relation Age of Onset   Asthma Sister    Asthma Other        nephew   Hypertension Sister    Diabetes type II Sister     SOCIAL HISTORY: Social History   Tobacco Use   Smoking status: Former    Packs/day: 0.50    Years: 0.50    Pack years: 0.25    Types: Cigarettes    Start date: 07/28/1983    Quit date: 02/27/2013    Years since quitting: 7.8   Smokeless tobacco: Never  Substance Use Topics   Alcohol use: No    Comment: quit ETOH about 5 months ago-beer and liquor    No Known Allergies  Current Facility-Administered Medications  Medication Dose Route Frequency Provider Last Rate Last Admin   heparin ADULT infusion 100 units/mL (25000 units/2536m  2,000 Units/hr Intravenous Continuous Mancheril, BeDarnell LevelRPH       heparin bolus via  infusion 6,000 Units  6,000 Units Intravenous Once Mancheril, Darnell Level, RPH       insulin aspart (novoLOG) injection 8 Units  8 Units Intravenous Once Isla Pence, MD       sodium chloride 0.9 % bolus 500 mL  500 mL Intravenous Once Isla Pence, MD       Current Outpatient Medications  Medication Sig Dispense Refill   acetaminophen (TYLENOL) 325 MG tablet Take 1-2 tablets (325-650 mg total) by mouth every 4 (four) hours as needed for mild pain.     allopurinol (ZYLOPRIM) 100 MG tablet Take 1 tablet (100 mg total) by mouth daily. 90 tablet 3   alum & mag hydroxide-simeth (MAALOX/MYLANTA) 200-200-20 MG/5ML suspension Take 30 mLs by mouth every 4 (four) hours as needed for indigestion, heartburn or flatulence. (Patient not taking: No sig reported) 355 mL 0   atorvastatin (LIPITOR) 10 MG tablet Take 1 tablet (10 mg total) by  mouth daily. 90 tablet 3   BD INSULIN SYRINGE U/F 31G X 5/16" 0.5 ML MISC USE FOR DIABETIC TESTING     blood glucose meter kit and supplies Dispense based on patient and insurance preference. Use up to four times daily as directed. (FOR ICD-10 E10.9, E11.9). 1 each 0   diclofenac Sodium (VOLTAREN) 1 % GEL Apply 2 g topically 4 (four) times daily. 150 g 5   gabapentin (NEURONTIN) 100 MG capsule Take 2 capsules (200 mg total) by mouth 3 (three) times daily. 540 capsule 3   Insulin Syringes, Disposable, U-100 0.5 ML MISC 1 Syringe by Does not apply route 3 (three) times daily after meals. 100 each 11   ipratropium-albuterol (DUONEB) 0.5-2.5 (3) MG/3ML SOLN Take 3 mLs by nebulization every 6 (six) hours as needed. 360 mL 1   LANTUS 100 UNIT/ML injection Inject 0.32 mLs (32 Units total) into the skin 2 (two) times daily. 57.6 mL 3   metoprolol tartrate (LOPRESSOR) 25 MG tablet Take 0.5 tablets (12.5 mg total) by mouth 2 (two) times daily. 90 tablet 3   mometasone-formoterol (DULERA) 100-5 MCG/ACT AERO Inhale 2 puffs into the lungs 2 (two) times daily for 8 days. 8 g 2   nutrition supplement, JUVEN, (JUVEN) PACK Take 1 packet by mouth 2 (two) times daily between meals. (Patient not taking: No sig reported) 60 packet 0   pantoprazole (PROTONIX) 40 MG tablet Take 1 tablet (40 mg total) by mouth at bedtime. 90 tablet 3   potassium chloride SA (KLOR-CON) 20 MEQ tablet Take 1 tablet (20 mEq total) by mouth 2 (two) times daily. 180 tablet 3   QUEtiapine (SEROQUEL) 100 MG tablet Take 1 tablet (100 mg total) by mouth 2 (two) times daily. 180 tablet 3   sertraline (ZOLOFT) 100 MG tablet Take 1 tablet (100 mg total) by mouth daily. 90 tablet 3   torsemide (DEMADEX) 10 MG tablet Take 1 tablet (10 mg total) by mouth daily. 90 tablet 3   traZODone (DESYREL) 50 MG tablet TAKE 0.5-1 TABLETS (25-50 MG TOTAL) BY MOUTH AT BEDTIME AS NEEDED FOR SLEEP. 15 tablet 11   triamcinolone ointment (KENALOG) 0.5 % Apply 1 application  topically 2 (two) times daily. 30 g 0    REVIEW OF SYSTEMS:  '[X]'  denotes positive finding, '[ ]'  denotes negative finding Cardiac  Comments:  Chest pain or chest pressure:    Shortness of breath upon exertion: x   Short of breath when lying flat: x   Irregular heart rhythm:  Vascular    Pain in calf, thigh, or hip brought on by ambulation:    Pain in feet at night that wakes you up from your sleep:     Blood clot in your veins:    Leg swelling:  x       Pulmonary    Oxygen at home:    Productive cough:     Wheezing:         Neurologic    Sudden weakness in arms or legs:     Sudden numbness in arms or legs:     Sudden onset of difficulty speaking or slurred speech:    Temporary loss of vision in one eye:     Problems with dizziness:         Gastrointestinal    Blood in stool:     Vomited blood:         Genitourinary    Burning when urinating:     Blood in urine:        Psychiatric    Major depression:         Hematologic    Bleeding problems:    Problems with blood clotting too easily:        Skin    Rashes or ulcers:        Constitutional    Fever or chills:    -  PHYSICAL EXAM:   Vitals:   01/07/21 1420 01/07/21 1421 01/07/21 1720  BP: (!) 127/94  (!) 141/74  Pulse: 99  86  Resp: 20  20  Temp: 99 F (37.2 C)    TempSrc: Oral    SpO2: 94%  98%  Height:  '5\' 8"'  (1.727 m)    Body mass index is 58.93 kg/m. GENERAL: The patient is a well-nourished male, in no acute distress. The vital signs are documented above. CARDIAC: There is a regular rate and rhythm.  VASCULAR: He has a biphasic dorsalis pedis signal bilaterally. He has moderate left lower extremity swelling. PULMONARY: There is good air exchange bilaterally without wheezing or rales. ABDOMEN: Soft and non-tender.  It is otherwise difficult to assess his abdomen because of his obesity. MUSCULOSKELETAL: There are no major deformities. NEUROLOGIC: No focal weakness or paresthesias are  detected. SKIN: There are no ulcers or rashes noted. PSYCHIATRIC: The patient has a normal affect.  DATA:    VENOUS DUPLEX: I have independently interpreted the venous duplex scan that was done today.  On the left side there is acute DVT noted in the common femoral vein, femoral vein, popliteal vein, and proximal deep femoral vein.  DUPLEX IVC ILIACS: I have independently interpreted his duplex of the IVC and iliacs.  The common iliac veins could not be seen.  The IVC was patent.  There appeared to be acute clot in the left external iliac vein.  CT ANGIO CHEST: This is pending.  CT VENOGRAM ABDOMEN PELVIS: This is pending.    Deitra Mayo [facility]

## 2021-01-07 NOTE — ED Notes (Signed)
CT called regarding scan

## 2021-01-07 NOTE — ED Triage Notes (Signed)
Pt reports left leg swelling for the past 3 days with pain. Hx of DVT, is not on any anticoagulant. Pt has trach in place with speaking valve, denies chest pain or SOB, requesting his cannula be changed. Pt a.o, resp e.u at this time

## 2021-01-07 NOTE — ED Provider Notes (Signed)
[facility] EMERGENCY DEPARTMENT Provider Note   CSN: [id number] Arrival date & time: 01/07/21  1410     History Chief Complaint  Patient presents with   Leg Swelling    Timothy Le is a 60 y.o. male.  Pt presents to the ED today with LLE swelling.  Pt has a hx of DVT in Nov of 2021 which required a thrombectomy of the IVC, left common external leg veins, left common femoral vein and femoral vein and stents of left common iliac vein was placed with balloon angioplasty of left common femoral and femoral vein. Pt was on Eliquis, but has been out for a few months.  He did f/u with vascular in September and Eliquis was not refilled.  Pt said he's had increased swelling to the left leg for 3 days.  He feels like he has a DVT.  Pt also said he's been feeling tired.  Pt has some sob.  He has a hx of sleep apnea and has a chronic trach.  No fevers, but has "felt hot."      Past Medical History:  Diagnosis Date   Asthma    Chronic respiratory failure with hypoxia ([place]) 07/09/2017   COPD (chronic obstructive pulmonary disease) (HCC)    Diabetes mellitus without complication ([place])    Difficult intubation    Hypertension    Shortness of breath dyspnea    Sleep apnea     Patient Active Problem List   Diagnosis Date Noted   Acute deep vein thrombosis (DVT) of left lower extremity ([place]) 12/04/2019   Depression with anxiety 12/04/2019   Pain due to onychomycosis of toenails of both feet 09/27/2019   Tracheobronchitis 09/26/2019   UTI (urinary tract infection) 09/26/2019   Debility 09/15/2019   Chronic diastolic CHF (congestive heart failure) ([place]) 09/07/2019   Poorly controlled diabetes mellitus ([place]) 09/06/2019   Acute airway obstruction    Endotracheal tube present    Acute on chronic respiratory failure ([place]) 07/30/2019   Acute respiratory failure ([place]) 07/30/2019   Chronic respiratory failure with hypoxia and hypercapnia (HCC)    Nodule of soft tissue  01/24/2019   Benign essential HTN    Hyperlipidemia    Tracheostomy dependence ([place])    Physical deconditioning 07/15/2017   Chronic respiratory failure with hypoxia ([place]) 07/09/2017   Acute pulmonary edema ([place])    Decubitus ulcer 06/19/2017   Difficult airway for intubation    Hypoventilation associated with obesity syndrome ([place]) 06/09/2017   Seasonal and perennial allergic rhinitis 05/24/2016   AKI (acute kidney injury) ([place]) 04/08/2015   Hyperosmolar hyperglycemic state (HHS) ([place]) 03/28/2015   Type 2 diabetes mellitus with hyperosmolar nonketotic hyperglycemia ([place]) 03/28/2015   Acute on chronic respiratory failure with hypoxemia ([place]) 03/27/2015   COPD mixed type ([place]) 12/27/2014   Severe obesity (BMI >= 40) ([place]) 12/27/2014   Diabetes mellitus type 2, controlled ([place]) 12/27/2014    Class: Chronic   OSA (obstructive sleep apnea) 07/29/2013   Peripheral edema 07/29/2013    Past Surgical History:  Procedure Laterality Date   INTRAVASCULAR ULTRASOUND/IVUS Left 12/11/2019   Procedure: Intravascular Ultrasound/IVUS;  Surgeon: Waynetta Sandy, MD;  Location: [place] [facility];  Service: Cardiovascular;  Laterality: Left;  Left lower extremity venous   IR GASTROSTOMY TUBE MOD SED  07/05/2017   PERIPHERAL VASCULAR THROMBECTOMY Left 12/11/2019   Procedure: PERIPHERAL VASCULAR THROMBECTOMY;  Surgeon: Waynetta Sandy, MD;  Location: [place] [facility];  Service: Cardiovascular;  Laterality: Left;  THYROIDECTOMY Left 08/30/2019   Procedure: THYROIDECTOMY CERVICAL LIPECTOMY;  Surgeon: Izora Gala, MD;  Location: [place];  Service: ENT;  Laterality: Left;  NEEDS RNFA PLEASE   TRACHEOSTOMY TUBE PLACEMENT N/A 06/17/2017   Procedure: TRACHEOSTOMY;  Surgeon: Leta Baptist, MD;  Location: [place];  Service: ENT;  Laterality: N/A;   TRACHEOSTOMY TUBE PLACEMENT N/A 08/14/2019   Procedure: TRACHEOSTOMY;  Surgeon: Izora Gala, MD;  [facility];  Service: ENT;  Laterality: N/A;    TRACHEOSTOMY TUBE PLACEMENT N/A 08/30/2019   Procedure: TRACHEOSTOMY  REVISION;  Surgeon: Izora Gala, MD;  Location: [place];  Service: ENT;  Laterality: N/A;       Family History  Problem Relation Age of Onset   Asthma Sister    Asthma Other        nephew   Hypertension Sister    Diabetes type II Sister     Social History   Tobacco Use   Smoking status: Former    Packs/day: 0.50    Years: 0.50    Pack years: 0.25    Types: Cigarettes    Start date: 07/28/1983    Quit date: 02/27/2013    Years since quitting: 7.8   Smokeless tobacco: Never  Vaping Use   Vaping Use: Never used  Substance Use Topics   Alcohol use: No    Comment: quit ETOH about 5 months ago-beer and liquor   Drug use: No    Comment: quit 6 months-"pot"-smoked couple joints a week    Home Medications Prior to Admission medications   Medication Sig Start Date End Date Taking? Authorizing Provider  acetaminophen (TYLENOL) 325 MG tablet Take 1-2 tablets (325-650 mg total) by mouth every 4 (four) hours as needed for mild pain. 09/26/19   Love, Ivan Anchors, PA-C  allopurinol (ZYLOPRIM) 100 MG tablet Take 1 tablet (100 mg total) by mouth daily. 10/21/20 10/21/21  Vevelyn Francois, NP  alum & mag hydroxide-simeth (MAALOX/MYLANTA) 200-200-20 MG/5ML suspension Take 30 mLs by mouth every 4 (four) hours as needed for indigestion, heartburn or flatulence. Patient not taking: No sig reported 09/15/19   Samella Parr, NP  atorvastatin (LIPITOR) 10 MG tablet Take 1 tablet (10 mg total) by mouth daily. 10/21/20 10/21/21  Vevelyn Francois, NP  BD INSULIN SYRINGE U/F 31G X 5/16" 0.5 ML MISC USE FOR DIABETIC TESTING 03/06/20   [provider]  blood glucose meter kit and supplies Dispense based on patient and insurance preference. Use up to four times daily as directed. (FOR ICD-10 E10.9, E11.9). 10/21/20   Vevelyn Francois, NP  diclofenac Sodium (VOLTAREN) 1 % GEL Apply 2 g topically 4 (four) times daily. 10/12/19   Vevelyn Francois,  NP  gabapentin (NEURONTIN) 100 MG capsule Take 2 capsules (200 mg total) by mouth 3 (three) times daily. 10/21/20 10/21/21  Vevelyn Francois, NP  Insulin Syringes, Disposable, U-100 0.5 ML MISC 1 Syringe by Does not apply route 3 (three) times daily after meals. 10/21/20 10/21/21  Vevelyn Francois, NP  ipratropium-albuterol (DUONEB) 0.5-2.5 (3) MG/3ML SOLN Take 3 mLs by nebulization every 6 (six) hours as needed. 10/21/20 10/21/21  Vevelyn Francois, NP  LANTUS 100 UNIT/ML injection Inject 0.32 mLs (32 Units total) into the skin 2 (two) times daily. 10/21/20 10/21/21  Vevelyn Francois, NP  metoprolol tartrate (LOPRESSOR) 25 MG tablet Take 0.5 tablets (12.5 mg total) by mouth 2 (two) times daily. 10/21/20 10/21/21  Vevelyn Francois, NP  mometasone-formoterol (DULERA) 100-5 MCG/ACT  AERO Inhale 2 puffs into the lungs 2 (two) times daily for 8 days. 10/21/20 10/29/20  Vevelyn Francois, NP  nutrition supplement, JUVEN, (JUVEN) PACK Take 1 packet by mouth 2 (two) times daily between meals. Patient not taking: No sig reported 09/26/19   Love, Ivan Anchors, PA-C  pantoprazole (PROTONIX) 40 MG tablet Take 1 tablet (40 mg total) by mouth at bedtime. 10/21/20 10/21/21  Vevelyn Francois, NP  potassium chloride SA (KLOR-CON) 20 MEQ tablet Take 1 tablet (20 mEq total) by mouth 2 (two) times daily. 10/21/20 10/21/21  Vevelyn Francois, NP  QUEtiapine (SEROQUEL) 100 MG tablet Take 1 tablet (100 mg total) by mouth 2 (two) times daily. 10/21/20 10/21/21  Vevelyn Francois, NP  sertraline (ZOLOFT) 100 MG tablet Take 1 tablet (100 mg total) by mouth daily. 10/21/20   Vevelyn Francois, NP  torsemide (DEMADEX) 10 MG tablet Take 1 tablet (10 mg total) by mouth daily. 10/21/20 10/21/21  Vevelyn Francois, NP  traZODone (DESYREL) 50 MG tablet TAKE 0.5-1 TABLETS (25-50 MG TOTAL) BY MOUTH AT BEDTIME AS NEEDED FOR SLEEP. 02/13/20 02/12/21  Vevelyn Francois, NP  triamcinolone ointment (KENALOG) 0.5 % Apply 1 application topically 2 (two) times daily. 10/21/20   Vevelyn Francois, NP  apixaban (ELIQUIS) 5 MG TABS tablet Take 1 tablet (5 mg total) by mouth 2 (two) times daily. 12/13/19 04/11/20  [place] Eric J, DO    Allergies    Patient has no known allergies.  Review of Systems   Review of Systems  Cardiovascular:  Positive for leg swelling.  All other systems reviewed and are negative.  Physical Exam Updated Vital Signs BP (!) 149/93    Pulse 87    Temp 99 F (37.2 C) (Oral)    Resp 20    Ht _0  (1.727 m)    SpO2 96%    BMI 58.93 kg/m   Physical Exam Vitals and nursing note reviewed.  Constitutional:      Appearance: Normal appearance. He is obese.  HENT:     Head: Normocephalic and atraumatic.     Right Ear: External ear normal.     Left Ear: External ear normal.     Nose: Nose normal.     Mouth/Throat:     Mouth: Mucous membranes are dry.  Eyes:     Extraocular Movements: Extraocular movements intact.     Conjunctiva/sclera: Conjunctivae normal.     Pupils: Pupils are equal, round, and reactive to light.  Neck:     Comments: Trach in place Cardiovascular:     Rate and Rhythm: Normal rate and regular rhythm.     Pulses: Normal pulses.     Heart sounds: Normal heart sounds.  Pulmonary:     Effort: Pulmonary effort is normal.     Breath sounds: Normal breath sounds.  Abdominal:     General: Abdomen is flat. Bowel sounds are normal.     Palpations: Abdomen is soft.  Musculoskeletal:     Left lower leg: Edema present.  Skin:    General: Skin is warm.     Capillary Refill: Capillary refill takes less than 2 seconds.  Neurological:     General: No focal deficit present.     Mental Status: He is alert and oriented to person, place, and time.  Psychiatric:        Mood and Affect: Mood normal.        Behavior: Behavior normal.    ED Results /  Procedures / Treatments   Labs (all labs ordered are listed, but only abnormal results are displayed) Labs Reviewed  BASIC METABOLIC PANEL - Abnormal; Notable for the following  components:      Result Value   Sodium 132 (*)    Chloride 93 (*)    Glucose, Bld 509 (*)    BUN 25 (*)    Creatinine, Ser 1.49 (*)    GFR, Estimated 54 (*)    All other components within normal limits  ANTITHROMBIN III - Abnormal; Notable for the following components:   AntiThromb III Func 131 (*)    All other components within normal limits  CBG MONITORING, ED - Abnormal; Notable for the following components:   Glucose-Capillary 303 (*)    All other components within normal limits  RESP PANEL BY RT-PCR (FLU A&B, COVID) ARPGX2  CBC WITH DIFFERENTIAL/PLATELET  PROTEIN C ACTIVITY  PROTEIN C, TOTAL  PROTEIN S ACTIVITY  PROTEIN S, TOTAL  LUPUS ANTICOAGULANT PANEL  BETA-2-GLYCOPROTEIN I ABS, IGG/M/A  HOMOCYSTEINE  FACTOR 5 LEIDEN  PROTHROMBIN GENE MUTATION  CARDIOLIPIN ANTIBODIES, IGG, IGM, IGA  HEPARIN LEVEL (UNFRACTIONATED)  CBC    EKG None  Radiology CT Angio Chest PE W and/or Wo Contrast  Result Date: 01/07/2021 CLINICAL DATA:  Concern for pulmonary embolism. Concern for deep venous thrombosis in the iliac veins. EXAM: CT ANGIOGRAPHY CHEST CT venogram ABDOMEN AND PELVIS WITH CONTRAST TECHNIQUE: Multidetector CT imaging of the chest was performed using the standard protocol during bolus administration of intravenous contrast. Multiplanar CT image reconstructions and MIPs were obtained to evaluate the vascular anatomy. Multidetector CT imaging of the abdomen and pelvis was performed using the delayed standard protocol during bolus administration of intravenous contrast. CONTRAST:  171m OMNIPAQUE IOHEXOL 350 MG/ML SOLN COMPARISON:  None. FINDINGS: CTA CHEST FINDINGS Cardiovascular: No filling defects within the pulmonary arteries to suggest acute pulmonary embolism. No significant vascular findings. Normal heart size. No pericardial effusion. Mediastinum/Nodes: No axillary or supraclavicular adenopathy. No mediastinal or hilar adenopathy. No pericardial fluid. Esophagus normal.  Lungs/Pleura: No pulmonary infarction. Mild atelectasis LEFT lower lobe. No pneumonia. Musculoskeletal: Degenerative osteophytosis of the spine. Review of the MIP images confirms the above findings. CT ABDOMEN and PELVIS FINDINGS Hepatobiliary: Liver is normal.  Small sludge in the gallbladder. Pancreas: Pancreas is normal. No ductal dilatation. No pancreatic inflammation. Spleen: Normal spleen Adrenals/urinary tract: Adrenal glands and kidneys are normal. The ureters and bladder normal. Stomach/Bowel: Stomach, small bowel, appendix, and cecum are normal. The colon and rectosigmoid colon are normal. Vascular/Lymphatic: Abdominal aorta normal. Vascular stent within the LEFT common iliac vein. No evidence of thrombosis within the iliac veins although the bolus timing does not well opacified the veins. Reproductive: Unremarkable Other: No free fluid. Musculoskeletal: No aggressive osseous lesion. Review of the MIP images confirms the above findings. IMPRESSION: Chest Impression: 1. No evidence acute pulmonary embolism. 2. No acute pulmonary parenchymal findings. Abdomen / Pelvis Impression: 1. No evidence of venous thrombosis in iliac veins although they are not well opacified. 2. LEFT common iliac vein stent is expanded. Electronically Signed   By: SSuzy BouchardM.D.   On: 01/07/2021 21:43   VAS [place]/ILIAC (VENOUS ONLY)  Result Date: 01/07/2021 IVC/ILIAC STUDY Patient Name:  Timothy Le Date of Exam:   01/07/2021 Medical Rec #: [id number]        Accession #:    [id number] of Birth: [birth year]        Patient Gender: M Patient Age:  60 years Exam Location:  [facility] Procedure:      VAS [place] IVC/ILIAC (VENOUS ONLY) Referring Phys: Vonna Kotyk GEIPLE --------------------------------------------------------------------------------  Indications: Left lower extremity DVT Other Factors: History of DVT. Limitations: Air/bowel gas and obesity.  Comparison Study: 12/07/2019 IVC/iliac vein duplex  Performing Technologist: Maudry Mayhew MHA, RDMS, RVT, RDCS  Examination Guidelines: A complete evaluation includes B-mode imaging, spectral Doppler, color Doppler, and power Doppler as needed of all accessible portions of each vessel. Bilateral testing is considered an integral part of a complete examination. Limited examinations for reoccurring indications may be performed as noted.  IVC/Iliac Findings: +----------+------+--------+--------+     IVC     Patent Thrombus Comments  +----------+------+--------+--------+  IVC Prox   patent                    +----------+------+--------+--------+  IVC Mid    patent                    +----------+------+--------+--------+  IVC Distal patent                    +----------+------+--------+--------+  Unable to visualize bilateral CIVs +-------------------------+---------+-----------+---------+-----------+--------+             EIV            RT-Patent RT-Thrombus LT-Patent LT-Thrombus Comments  +-------------------------+---------+-----------+---------+-----------+--------+  External Iliac Vein                                          acute               Distal                                                                          +-------------------------+---------+-----------+---------+-----------+--------+   Summary: IVC/Iliac: There is no evidence of thrombus involving the IVC. There is no evidence of thrombus involving the right external iliac vein. There is evidence of acute thrombus involving the left external iliac vein. Unable to visualzie bilatearl common iliac veins.  *See table(s) above for measurements and observations.  Electronically signed by Deitra Mayo MD on 01/07/2021 at 66:14:18 PM.    Final    VAS [place] LOWER EXTREMITY VENOUS (DVT) (ONLY [facility] & [facility])  Result Date: 01/07/2021  Lower Venous DVT Study Patient Name:  Timothy Le  Date of Exam:   01/07/2021 Medical Rec #: [id number]         Accession #:    [id number] Date of Birth: [birth year]          Patient Gender: M Patient Age:   9 years Exam Location:  [facility] Procedure:      VAS [place] LOWER EXTREMITY VENOUS (DVT) Referring Phys: Vonna Kotyk GEIPLE --------------------------------------------------------------------------------  Indications: Edema, and Pain.  Comparison       12/07/2019- age indeterminate DVT throughout left lower Study:           extremity Performing Technologist: Maudry Mayhew MHA, RDMS, RVT, RDCS  Examination Guidelines: A complete evaluation includes B-mode imaging, spectral Doppler, color Doppler, and power Doppler as needed of all accessible portions of each  vessel. Bilateral testing is considered an integral part of a complete examination. Limited examinations for reoccurring indications may be performed as noted. The reflux portion of the exam is performed with the patient in reverse Trendelenburg.  +-----+---------------+---------+-----------+----------+--------------+  RIGHT Compressibility Phasicity Spontaneity Properties Thrombus Aging  +-----+---------------+---------+-----------+----------+--------------+  CFV   Full            Yes       Yes                                    +-----+---------------+---------+-----------+----------+--------------+   +---------+---------------+---------+-----------+----------+--------------+  LEFT      Compressibility Phasicity Spontaneity Properties Thrombus Aging  +---------+---------------+---------+-----------+----------+--------------+  CFV       None                      No                     Acute           +---------+---------------+---------+-----------+----------+--------------+  SFJ       None                                             Acute           +---------+---------------+---------+-----------+----------+--------------+  FV Prox   None                      No                     Acute           +---------+---------------+---------+-----------+----------+--------------+  FV Mid                              No                                      +---------+---------------+---------+-----------+----------+--------------+  FV Distal                           No                                     +---------+---------------+---------+-----------+----------+--------------+  PFV       None                      No                     Acute           +---------+---------------+---------+-----------+----------+--------------+  POP       None                      No                     Acute           +---------+---------------+---------+-----------+----------+--------------+   Left Technical Findings: Not visualized segments include PTV, peroneal veins.   Summary: LEFT: - Findings consistent with acute deep  vein thrombosis involving the left common femoral vein, SF junction, left femoral vein, left proximal profunda vein, and left popliteal vein. - No cystic structure found in the popliteal fossa.  *See table(s) above for measurements and observations. Electronically signed by Deitra Mayo MD on 01/07/2021 at 6:11:52 PM.    Final     Procedures Procedures   Medications Ordered in ED Medications  heparin ADULT infusion 100 units/mL (25000 units/257m) (2,000 Units/hr Intravenous New Bag/Given 01/07/21 1835)  sodium chloride 0.9 % bolus 500 mL (0 mLs Intravenous Stopped 01/07/21 1904)  insulin aspart (novoLOG) injection 8 Units (8 Units Intravenous Given 01/07/21 1831)  heparin bolus via infusion 6,000 Units (6,000 Units Intravenous Bolus from Bag 01/07/21 1836)  iohexol (OMNIPAQUE) 350 MG/ML injection 100 mL (100 mLs Intravenous Contrast Given 01/07/21 2113)    ED Course  I have reviewed the triage vital signs and the nursing notes.  Pertinent labs & imaging results that were available during my care of the patient were reviewed by me and considered in my medical decision making (see chart for details).    MDM Rules/Calculators/A&P  Pt has extensive clot in his left leg involving the left common femoral  vein, SF junction, left femoral vein, left proximal profunda vein, and left popliteal vein.  CTA chest with no PE.  IV heparin ordered.  Pt d/w Dr. DScot Dock(vascular) who will consult on this patient.  He said pt is not a candidate for mechanical thrombectomy as he has a Wallstent in his left iliac vein and that could move the stent.  He may be a candidate for thrombolytic therapy.  He will d/w Dr. CDonzetta Mattershow had seen this patient in the past.  Pt's glucose is also elevated at 509.  He said he's been taking his meds.  BS down to 303 after some fluids and 8 units of insulin.  Pt d/w Dr. SCyd Silence(triad) for admission.  CRITICAL CARE Performed by: JIsla Pence  Total critical care time: 30 minutes  Critical care time was exclusive of separately billable procedures and treating other patients.  Critical care was necessary to treat or prevent imminent or life-threatening deterioration.  Critical care was time spent personally by me on the following activities: development of treatment plan with patient and/or surrogate as well as nursing, discussions with consultants, evaluation of patient's response to treatment, examination of patient, obtaining history from patient or surrogate, ordering and performing treatments and interventions, ordering and review of laboratory studies, ordering and review of radiographic studies, pulse oximetry and re-evaluation of patient's condition.   Final Clinical Impression(s) / ED Diagnoses Final diagnoses:  Acute deep vein thrombosis (DVT) of proximal vein of left lower extremity ([place]  Poorly controlled type 2 diabetes mellitus ([place]    Rx / DC Orders ED Discharge Orders     None        HIsla Pence MD 01/07/21 2212

## 2021-01-07 NOTE — ED Provider Notes (Signed)
Emergency Medicine Provider Triage Evaluation Note  Timothy Le , a 60 y.o. male  was evaluated in triage.  Pt complains of swelling of the left leg.  This started about 3 days ago.  He feels swelling up to his groin.  He has a history of previous DVT, treated, not currently on anticoagulation.  This feels like when he had a blood clot in the past.  No worsening chest pain or shortness of breath.  He has a tracheostomy in place.  Review of Systems  Positive: Leg swelling Negative: Chest pain  Physical Exam  BP (!) 127/94 (BP Location: Left Arm)    Pulse 99    Temp 99 F (37.2 C) (Oral)    Resp 20    SpO2 94%  Gen:   Awake, no distress   Resp:  Normal effort  MSK:   Moves extremities without difficulty  Other:  Left lower extremity is warm, markedly swollen compared to the right no signs of cellulitis.  Medical Decision Making  Medically screening exam initiated at 2:20 PM.  Appropriate orders placed.  [name] was informed that the remainder of the evaluation will be completed by another provider, this initial triage assessment does not replace that evaluation, and the importance of remaining in the ED until their evaluation is complete.     Carlisle Cater, PA-C 01/07/21 1421    Carmin Muskrat, MD 01/07/21 1524

## 2021-01-07 NOTE — ED Notes (Signed)
IV team consult placed. Attempted IV with no success. Pt needs IV for CTA PE and labs. Also needs IV for heparin drip.

## 2021-01-07 NOTE — ED Notes (Signed)
Patient transported back from CT 

## 2021-01-07 NOTE — ED Notes (Signed)
Pt requested trach suction, this RN assisted. Pt more comfortable now. New inner canula requested, as pt's home equipment is very old, needs to be replaced.

## 2021-01-07 NOTE — Progress Notes (Signed)
ANTICOAGULATION CONSULT NOTE - Initial Consult  Pharmacy Consult for heparin Indication: DVT  No Known Allergies  Patient Measurements: Height: 5\' 8"  (172.7 cm) IBW/kg (Calculated) : 68.4 Heparin Dosing Weight: 175 kg   Vital Signs: Temp: 99 F (37.2 C) (12/20 1420) Temp Source: Oral (12/20 1420) BP: 127/94 (12/20 1420) Pulse Rate: 99 (12/20 1420)  Labs: Recent Labs    01/07/21 1426  HGB 15.4  HCT 48.2  PLT 169  CREATININE 1.49*    CrCl cannot be calculated (Unknown ideal weight.).   Medical History: Past Medical History:  Diagnosis Date   Asthma    Chronic respiratory failure with hypoxia ([place]) 07/09/2017   COPD (chronic obstructive pulmonary disease) ([facility])    Diabetes mellitus without complication ([facility])    Difficult intubation    Hypertension    Shortness of breath dyspnea    Sleep apnea     Medications:  (Not in a hospital admission)   Assessment: 52 YOM with acute L DVT. Pharmacy consulted to start IV heparin. Of note patient has a h/o of DVT in Nov, 2021 requiring thrombectomy. He completed treatment with Eliquis in September.   H/H and Plt wnl. SCr mildly elevated   Goal of Therapy:  Heparin level 0.3-0.7 units/ml Monitor platelets by anticoagulation protocol: Yes   Plan:  -Give heparin 6000 units IV bolus followed heparin infusion at 2000 units/hr  -F/u 8 hr HL -Monitor daily HL, CBC and s/s of bleeding   Albertina Parr, PharmD., BCPS, BCCCP Clinical Pharmacist Please refer to [facility] for unit-specific pharmacist

## 2021-01-07 NOTE — H&P (Signed)
History and Physical    Timothy Le YYT:[id number] DOB: [birth year] DOA: 01/07/2021  PCP: Vevelyn Francois, NP  Patient coming from: Home   Chief Complaint:  Chief Complaint  Patient presents with   Leg Swelling     HPI:    60 year old male with past medical history of diabetes mellitus type 2, morbid obesity, chronic hypoxic respiratory failure complicated by COPD and severe obstructive sleep apnea requiring tracheostomy placement, hypertension, diastolic congestive heart failure and extensive left lower extremity DVT status post mechanical thrombectomy of the IVC and left leg veins followed by balloon angioplasty and stent placement of left iliac (11/2019 by Dr. Donzetta Matters) who presents to [facility] emergency department with left leg swelling.  Patient explains that approximately 6 months after that hospitalization he ran out of Eliquis.  The patient followed up in vascular surgery clinic 09/2020 and ultrasound was performed revealing no recurrence of DVT and therefore Eliquis was not reinitiated.  Patient now explains that approximately 3 days ago he began to develop left leg pain.  This left leg pain was associated with swelling that rapidly progressed over the next several days.  Pain became severe in intensity, tight in quality and worse with any movement of the affected extremity.  Patient states that the leg "feels hot."  Patient denies fevers.  Due to patient's progressively worsening symptoms patient eventually presented to [facility] emergency room for evaluation.    Upon evaluation in the emergency department ultrasound of the bilateral lower extremities as well as the IVC reveal acute thrombus involving the left external iliac vein, left common femoral vein, SF junction, left femoral vein left proximal profunda vein and left popliteal vein.  ER provider discussed case with Dr. Doren Custard with vascular surgery who promptly came to evaluate the patient at the bedside and  recommended initiation of heparin as well as hospitalization as well as leg elevation.  A thrombectomy could not be performed due to presence of a Wallstent.  The hospitalist group is now been called to assess the patient for admission to the hospital.  Review of Systems:   Review of Systems  Cardiovascular:  Positive for leg swelling.  Musculoskeletal:        Left leg pain  All other systems reviewed and are negative.  Past Medical History:  Diagnosis Date   Asthma    Chronic respiratory failure with hypoxia ([place]) 07/09/2017   COPD (chronic obstructive pulmonary disease) (HCC)    Diabetes mellitus without complication ([place])    Difficult intubation    Hypertension    Shortness of breath dyspnea    Sleep apnea     Past Surgical History:  Procedure Laterality Date   INTRAVASCULAR ULTRASOUND/IVUS Left 12/11/2019   Procedure: Intravascular Ultrasound/IVUS;  Surgeon: Waynetta Sandy, MD;  Location: [place] [facility];  Service: Cardiovascular;  Laterality: Left;  Left lower extremity venous   IR GASTROSTOMY TUBE MOD SED  07/05/2017   PERIPHERAL VASCULAR THROMBECTOMY Left 12/11/2019   Procedure: PERIPHERAL VASCULAR THROMBECTOMY;  Surgeon: Waynetta Sandy, MD;  Location: [place] [facility];  Service: Cardiovascular;  Laterality: Left;   THYROIDECTOMY Left 08/30/2019   Procedure: THYROIDECTOMY CERVICAL LIPECTOMY;  Surgeon: Izora Gala, MD;  Location: [place];  Service: ENT;  Laterality: Left;  NEEDS RNFA PLEASE   TRACHEOSTOMY TUBE PLACEMENT N/A 06/17/2017   Procedure: TRACHEOSTOMY;  Surgeon: Leta Baptist, MD;  Location: [place];  Service: ENT;  Laterality: N/A;   TRACHEOSTOMY TUBE PLACEMENT N/A 08/14/2019  Procedure: TRACHEOSTOMY;  Surgeon: Izora Gala, MD;  Location: [place];  Service: ENT;  Laterality: N/A;   TRACHEOSTOMY TUBE PLACEMENT N/A 08/30/2019   Procedure: TRACHEOSTOMY  REVISION;  Surgeon: Izora Gala, MD;  Location: [place];  Service: ENT;  Laterality: N/A;     reports  that he quit smoking about 7 years ago. His smoking use included cigarettes. He started smoking about 37 years ago. He has a 0.25 pack-year smoking history. He has never used smokeless tobacco. He reports that he does not drink alcohol and does not use drugs.  No Known Allergies  Family History  Problem Relation Age of Onset   Asthma Sister    Asthma Other        nephew   Hypertension Sister    Diabetes type II Sister      Prior to Admission medications   Medication Sig Start Date End Date Taking? Authorizing Provider  acetaminophen (TYLENOL) 325 MG tablet Take 1-2 tablets (325-650 mg total) by mouth every 4 (four) hours as needed for mild pain. 09/26/19   Love, Ivan Anchors, PA-C  allopurinol (ZYLOPRIM) 100 MG tablet Take 1 tablet (100 mg total) by mouth daily. 10/21/20 10/21/21  Vevelyn Francois, NP  alum & mag hydroxide-simeth (MAALOX/MYLANTA) 200-200-20 MG/5ML suspension Take 30 mLs by mouth every 4 (four) hours as needed for indigestion, heartburn or flatulence. Patient not taking: No sig reported 09/15/19   Samella Parr, NP  atorvastatin (LIPITOR) 10 MG tablet Take 1 tablet (10 mg total) by mouth daily. 10/21/20 10/21/21  Vevelyn Francois, NP  BD INSULIN SYRINGE U/F 31G X 5/16" 0.5 ML MISC USE FOR DIABETIC TESTING 03/06/20   [provider]  blood glucose meter kit and supplies Dispense based on patient and insurance preference. Use up to four times daily as directed. (FOR ICD-10 E10.9, E11.9). 10/21/20   Vevelyn Francois, NP  diclofenac Sodium (VOLTAREN) 1 % GEL Apply 2 g topically 4 (four) times daily. 10/12/19   Vevelyn Francois, NP  gabapentin (NEURONTIN) 100 MG capsule Take 2 capsules (200 mg total) by mouth 3 (three) times daily. 10/21/20 10/21/21  Vevelyn Francois, NP  Insulin Syringes, Disposable, U-100 0.5 ML MISC 1 Syringe by Does not apply route 3 (three) times daily after meals. 10/21/20 10/21/21  Vevelyn Francois, NP  ipratropium-albuterol (DUONEB) 0.5-2.5 (3) MG/3ML SOLN Take 3 mLs  by nebulization every 6 (six) hours as needed. 10/21/20 10/21/21  Vevelyn Francois, NP  LANTUS 100 UNIT/ML injection Inject 0.32 mLs (32 Units total) into the skin 2 (two) times daily. 10/21/20 10/21/21  Vevelyn Francois, NP  metoprolol tartrate (LOPRESSOR) 25 MG tablet Take 0.5 tablets (12.5 mg total) by mouth 2 (two) times daily. 10/21/20 10/21/21  Vevelyn Francois, NP  mometasone-formoterol (DULERA) 100-5 MCG/ACT AERO Inhale 2 puffs into the lungs 2 (two) times daily for 8 days. 10/21/20 10/29/20  Vevelyn Francois, NP  nutrition supplement, JUVEN, (JUVEN) PACK Take 1 packet by mouth 2 (two) times daily between meals. Patient not taking: No sig reported 09/26/19   Love, Ivan Anchors, PA-C  pantoprazole (PROTONIX) 40 MG tablet Take 1 tablet (40 mg total) by mouth at bedtime. 10/21/20 10/21/21  Vevelyn Francois, NP  potassium chloride SA (KLOR-CON) 20 MEQ tablet Take 1 tablet (20 mEq total) by mouth 2 (two) times daily. 10/21/20 10/21/21  Vevelyn Francois, NP  QUEtiapine (SEROQUEL) 100 MG tablet Take 1 tablet (100 mg total) by mouth 2 (two) times daily.  10/21/20 10/21/21  Vevelyn Francois, NP  sertraline (ZOLOFT) 100 MG tablet Take 1 tablet (100 mg total) by mouth daily. 10/21/20   Vevelyn Francois, NP  torsemide (DEMADEX) 10 MG tablet Take 1 tablet (10 mg total) by mouth daily. 10/21/20 10/21/21  Vevelyn Francois, NP  traZODone (DESYREL) 50 MG tablet TAKE 0.5-1 TABLETS (25-50 MG TOTAL) BY MOUTH AT BEDTIME AS NEEDED FOR SLEEP. 02/13/20 02/12/21  Vevelyn Francois, NP  triamcinolone ointment (KENALOG) 0.5 % Apply 1 application topically 2 (two) times daily. 10/21/20   Vevelyn Francois, NP  apixaban (ELIQUIS) 5 MG TABS tablet Take 1 tablet (5 mg total) by mouth 2 (two) times daily. 12/13/19 04/11/20  [place] Eric J, DO    Physical Exam: Vitals:   01/07/21 2315 01/07/21 2353 01/08/21 0000 01/08/21 0045  BP: 105/83  136/88 135/82  Pulse: 90  89 83  Resp: 16 18    Temp:      TempSrc:      SpO2: 94%  94% 96%  Height:         Constitutional: Awake alert and oriented x3, no associated distress.  Patient is obese. Skin: Notably dry skin, no rashes, no lesions, good skin turgor noted. Eyes: Pupils are equally reactive to light.  No evidence of scleral icterus or conjunctival pallor.  ENMT: Dry mucous membranes noted.  Posterior pharynx clear of any exudate or lesions.   Neck: normal, supple, no masses, no thyromegaly.  No evidence of jugular venous distension.   Respiratory: Intermittent mild expiratory wheezing noted.  No evidence of rales.  Scattered rhonchi bilaterally.  No accessory muscle use.  Cardiovascular: Regular rate and rhythm, no murmurs / rubs / gallops.  Extensive left lower extremity edema that is nonpitting and tracks all the way up to the groin. 2+ pedal pulses. No carotid bruits.  Chest:   Nontender without crepitus or deformity.   Back:   Nontender without crepitus or deformity. Abdomen: Abdomen is protuberant but soft and nontender.  No evidence of intra-abdominal masses.  Positive bowel sounds noted in all quadrants.   Musculoskeletal: No joint deformity upper and lower extremities. Good ROM, no contractures. Normal muscle tone.  Neurologic: CN 2-12 grossly intact. Sensation intact.  Patient moving all 4 extremities spontaneously.  Patient is following all commands.  Patient is responsive to verbal stimuli.   Psychiatric: Patient exhibits normal mood with appropriate affect.  Patient seems to possess insight as to their current situation.     Labs on Admission: I have personally reviewed following labs and imaging studies -   CBC: Recent Labs  Lab 01/07/21 1426  WBC 8.7  NEUTROABS 4.0  HGB 15.4  HCT 48.2  MCV 88.8  PLT 270   Basic Metabolic Panel: Recent Labs  Lab 01/07/21 1426  NA 132*  K 4.5  CL 93*  CO2 28  GLUCOSE 509*  BUN 25*  CREATININE 1.49*  CALCIUM 9.1   GFR: CrCl cannot be calculated (Unknown ideal weight.). Liver Function Tests: No results for input(s): AST,  ALT, ALKPHOS, BILITOT, PROT, ALBUMIN in the last 168 hours. No results for input(s): LIPASE, AMYLASE in the last 168 hours. No results for input(s): AMMONIA in the last 168 hours. Coagulation Profile: No results for input(s): INR, PROTIME in the last 168 hours. Cardiac Enzymes: No results for input(s): CKTOTAL, CKMB, CKMBINDEX, TROPONINI in the last 168 hours. BNP (last 3 results) No results for input(s): PROBNP in the last 8760 hours. HbA1C: No results for input(s):  HGBA1C in the last 72 hours. CBG: Recent Labs  Lab 01/07/21 2201  GLUCAP 303*   Lipid Profile: No results for input(s): CHOL, HDL, LDLCALC, TRIG, CHOLHDL, LDLDIRECT in the last 72 hours. Thyroid Function Tests: No results for input(s): TSH, T4TOTAL, FREET4, T3FREE, THYROIDAB in the last 72 hours. Anemia Panel: No results for input(s): VITAMINB12, FOLATE, FERRITIN, TIBC, IRON, RETICCTPCT in the last 72 hours. Urine analysis:    Component Value Date/Time   COLORURINE YELLOW 07/30/2019 1743   APPEARANCEUR HAZY (A) 07/30/2019 1743   LABSPEC 1.026 07/30/2019 1743   PHURINE 5.0 07/30/2019 1743   GLUCOSEU >=500 (A) 07/30/2019 1743   HGBUR LARGE (A) 07/30/2019 1743   BILIRUBINUR neg 04/11/2020 1137   KETONESUR negative 01/11/2020 0929   KETONESUR NEGATIVE 07/30/2019 1743   PROTEINUR Negative 04/11/2020 1137   PROTEINUR 100 (A) 07/30/2019 1743   UROBILINOGEN 1.0 04/11/2020 1137   NITRITE neg 04/11/2020 1137   NITRITE NEGATIVE 07/30/2019 1743   LEUKOCYTESUR Negative 04/11/2020 1137   LEUKOCYTESUR NEGATIVE 07/30/2019 1743    Radiological Exams on Admission - Personally Reviewed: CT Angio Chest PE W and/or Wo Contrast  Result Date: 01/07/2021 CLINICAL DATA:  Concern for pulmonary embolism. Concern for deep venous thrombosis in the iliac veins. EXAM: CT ANGIOGRAPHY CHEST CT venogram ABDOMEN AND PELVIS WITH CONTRAST TECHNIQUE: Multidetector CT imaging of the chest was performed using the standard protocol during bolus  administration of intravenous contrast. Multiplanar CT image reconstructions and MIPs were obtained to evaluate the vascular anatomy. Multidetector CT imaging of the abdomen and pelvis was performed using the delayed standard protocol during bolus administration of intravenous contrast. CONTRAST:  165m OMNIPAQUE IOHEXOL 350 MG/ML SOLN COMPARISON:  None. FINDINGS: CTA CHEST FINDINGS Cardiovascular: No filling defects within the pulmonary arteries to suggest acute pulmonary embolism. No significant vascular findings. Normal heart size. No pericardial effusion. Mediastinum/Nodes: No axillary or supraclavicular adenopathy. No mediastinal or hilar adenopathy. No pericardial fluid. Esophagus normal. Lungs/Pleura: No pulmonary infarction. Mild atelectasis LEFT lower lobe. No pneumonia. Musculoskeletal: Degenerative osteophytosis of the spine. Review of the MIP images confirms the above findings. CT ABDOMEN and PELVIS FINDINGS Hepatobiliary: Liver is normal.  Small sludge in the gallbladder. Pancreas: Pancreas is normal. No ductal dilatation. No pancreatic inflammation. Spleen: Normal spleen Adrenals/urinary tract: Adrenal glands and kidneys are normal. The ureters and bladder normal. Stomach/Bowel: Stomach, small bowel, appendix, and cecum are normal. The colon and rectosigmoid colon are normal. Vascular/Lymphatic: Abdominal aorta normal. Vascular stent within the LEFT common iliac vein. No evidence of thrombosis within the iliac veins although the bolus timing does not well opacified the veins. Reproductive: Unremarkable Other: No free fluid. Musculoskeletal: No aggressive osseous lesion. Review of the MIP images confirms the above findings. IMPRESSION: Chest Impression: 1. No evidence acute pulmonary embolism. 2. No acute pulmonary parenchymal findings. Abdomen / Pelvis Impression: 1. No evidence of venous thrombosis in iliac veins although they are not well opacified. 2. LEFT common iliac vein stent is expanded.  Electronically Signed   By: SSuzy BouchardM.D.   On: 01/07/2021 21:43   VAS [place]/ILIAC (VENOUS ONLY)  Result Date: 01/07/2021 IVC/ILIAC STUDY Patient Name:  DCLIFFARD HAIR Date of Exam:   01/07/2021 Medical Rec #: [id number]        Accession #:    [id number] of Birth: [birth year]        Patient Gender: M Patient Age:   89years [facility]  [facility] [place] IVC/ILIAC (VENOUS ONLY) Referring Phys: JOSHUA GEIPLE --------------------------------------------------------------------------------  Indications: Left lower extremity DVT Other Factors: History of DVT. Limitations: Air/bowel gas and obesity.  Comparison Study: 12/07/2019 IVC/iliac vein duplex Performing Technologist: Maudry Mayhew MHA, RDMS, RVT, RDCS  Examination Guidelines: A complete evaluation includes B-mode imaging, spectral Doppler, color Doppler, and power Doppler as needed of all accessible portions of each vessel. Bilateral testing is considered an integral part of a complete examination. Limited examinations for reoccurring indications may be performed as noted.  IVC/Iliac Findings: +----------+------+--------+--------+     IVC     Patent Thrombus Comments  +----------+------+--------+--------+  IVC Prox   patent                    +----------+------+--------+--------+  IVC Mid    patent                    +----------+------+--------+--------+  IVC Distal patent                    +----------+------+--------+--------+  Unable to visualize bilateral CIVs +-------------------------+---------+-----------+---------+-----------+--------+             EIV            RT-Patent RT-Thrombus LT-Patent LT-Thrombus Comments  +-------------------------+---------+-----------+---------+-----------+--------+  External Iliac Vein                                          acute               Distal                                                                           +-------------------------+---------+-----------+---------+-----------+--------+   Summary: IVC/Iliac: There is no evidence of thrombus involving the IVC. There is no evidence of thrombus involving the right external iliac vein. There is evidence of acute thrombus involving the left external iliac vein. Unable to visualzie bilatearl common iliac veins.  *See table(s) above for measurements and observations.  Electronically signed by Deitra Mayo MD on 01/07/2021 at 66:14:18 PM.    Final    CT VENOGRAM ABD/PEL  Result Date: 01/07/2021 CLINICAL DATA:  Concern for pulmonary embolism. Concern for deep venous thrombosis in the iliac veins. EXAM: CT ANGIOGRAPHY CHEST CT venogram ABDOMEN AND PELVIS WITH CONTRAST TECHNIQUE: Multidetector CT imaging of the chest was performed using the standard protocol during bolus administration of intravenous contrast. Multiplanar CT image reconstructions and MIPs were obtained to evaluate the vascular anatomy. Multidetector CT imaging of the abdomen and pelvis was performed using the delayed standard protocol during bolus administration of intravenous contrast. CONTRAST:  115m OMNIPAQUE IOHEXOL 350 MG/ML SOLN COMPARISON:  None. FINDINGS: CTA CHEST FINDINGS Cardiovascular: No filling defects within the pulmonary arteries to suggest acute pulmonary embolism. No significant vascular findings. Normal heart size. No pericardial effusion. Mediastinum/Nodes: No axillary or supraclavicular adenopathy. No mediastinal or hilar adenopathy. No pericardial fluid. Esophagus normal. Lungs/Pleura: No pulmonary infarction. Mild atelectasis LEFT lower lobe. No pneumonia. Musculoskeletal: Degenerative osteophytosis of the spine. Review of the MIP images confirms the above findings. CT ABDOMEN and PELVIS FINDINGS Hepatobiliary:  Liver is normal.  Small sludge in the gallbladder. Pancreas: Pancreas is normal. No ductal dilatation. No pancreatic inflammation. Spleen: Normal spleen Adrenals/urinary  tract: Adrenal glands and kidneys are normal. The ureters and bladder normal. Stomach/Bowel: Stomach, small bowel, appendix, and cecum are normal. The colon and rectosigmoid colon are normal. Vascular/Lymphatic: Abdominal aorta normal. Vascular stent within the LEFT common iliac vein. No evidence of thrombosis within the iliac veins although the bolus timing does not well opacified the veins. Reproductive: Unremarkable Other: No free fluid. Musculoskeletal: No aggressive osseous lesion. Review of the MIP images confirms the above findings. IMPRESSION: Chest Impression: 1. No evidence acute pulmonary embolism. 2. No acute pulmonary parenchymal findings. Abdomen / Pelvis Impression: 1. No evidence of venous thrombosis in iliac veins although they are not well opacified. 2. LEFT common iliac vein stent is expanded. Electronically Signed   By: Suzy Bouchard M.D.   On: 01/07/2021 21:43   VAS [place] LOWER EXTREMITY VENOUS (DVT) (ONLY [facility] & [facility])  Result Date: 01/07/2021  Lower Venous DVT Study Patient Name:  AQIL GOETTING  Date of Exam:   01/07/2021 Medical Rec #: [id number]         Accession #:    [id number] Date of Birth: 05/14/1960         Patient Gender: M Patient Age:   78 years Exam Location:  [facility] Procedure:      VAS [place] LOWER EXTREMITY VENOUS (DVT) Referring Phys: Vonna Kotyk GEIPLE --------------------------------------------------------------------------------  Indications: Edema, and Pain.  Comparison       12/07/2019- age indeterminate DVT throughout left lower Study:           extremity Performing Technologist: Maudry Mayhew MHA, RDMS, RVT, RDCS  Examination Guidelines: A complete evaluation includes B-mode imaging, spectral Doppler, color Doppler, and power Doppler as needed of all accessible portions of each vessel. Bilateral testing is considered an integral part of a complete examination. Limited examinations for reoccurring indications may be performed as noted. The reflux portion of the  exam is performed with the patient in reverse Trendelenburg.  +-----+---------------+---------+-----------+----------+--------------+  RIGHT Compressibility Phasicity Spontaneity Properties Thrombus Aging  +-----+---------------+---------+-----------+----------+--------------+  CFV   Full            Yes       Yes                                    +-----+---------------+---------+-----------+----------+--------------+   +---------+---------------+---------+-----------+----------+--------------+  LEFT      Compressibility Phasicity Spontaneity Properties Thrombus Aging  +---------+---------------+---------+-----------+----------+--------------+  CFV       None                      No                     Acute           +---------+---------------+---------+-----------+----------+--------------+  SFJ       None                                             Acute           +---------+---------------+---------+-----------+----------+--------------+  FV Prox   None  No                     Acute           +---------+---------------+---------+-----------+----------+--------------+  FV Mid                              No                                     +---------+---------------+---------+-----------+----------+--------------+  FV Distal                           No                                     +---------+---------------+---------+-----------+----------+--------------+  PFV       None                      No                     Acute           +---------+---------------+---------+-----------+----------+--------------+  POP       None                      No                     Acute           +---------+---------------+---------+-----------+----------+--------------+   Left Technical Findings: Not visualized segments include PTV, peroneal veins.   Summary: LEFT: - Findings consistent with acute deep vein thrombosis involving the left common femoral vein, SF junction, left femoral vein, left  proximal profunda vein, and left popliteal vein. - No cystic structure found in the popliteal fossa.  *See table(s) above for measurements and observations. Electronically signed by Deitra Mayo MD on 01/07/2021 at 6:11:52 PM.    Final     EKG: Personally reviewed.  Rhythm is normal sinus rhythm with heart rate of 84 bpm.  No dynamic ST segment changes appreciated.  Assessment/Plan  * Acute deep vein thrombosis (DVT) of left lower extremity (HCC) Extensive left lower extremity DVT as evidenced by ultrasound of the lower extremities in the emergency department Concern for impending cerulea dolens Patient is already been evaluated by Dr. Doren Custard with vascular surgery who has recommended a heparin infusion for now Patient will need lifelong anticoagulation going forward Hypercoagulable work-up has already been ordered by vascular surgery Mechanical thrombectomy not an option due to Wallstent that has been present since last DVT 11/2019 Dr. Doren Custard to discuss case with Dr. Donzetta Matters in the morning to discuss potential thrombolytic options Neurovascular checks in the meantime  Type 2 diabetes mellitus with hyperglycemia, with long-term current use of insulin [facility]) Patient presenting with substantial hyperglycemia and has a longstanding history of poorly controlled diabetes Initiating home regimen of basal insulin therapy 32 units of glargine twice daily Patient been placed on Accu-Cheks before every meal and nightly with resistant sliding scale insulin Hemoglobin A1C ordered Diabetic Diet   COPD (chronic obstructive pulmonary disease) ([place]) No evidence of COPD exacerbation this time Continue home regimen of maintenance inhalers As needed bronchodilator therapy for episodic shortness of breath and wheezing.  Chronic respiratory failure due to obstructive sleep apnea (HCC) No evidence of increased oxygen requirement, patient currently on room air   Chronic diastolic CHF (congestive heart  failure) (HCC) No clinical evidence of cardiogenic volume overload Continue home regimen of maintenance torsemide   Tracheostomy dependence ([place]) Due to poor outpatient follow-up patient has a damaged inner cannula Initiating respiratory consult  Will replace inner cannula Tracheostomy care per protocol      Code Status:  Full code  code status decision has been confirmed with: patient Family Communication: deferred   Status is: Inpatient  Remains inpatient appropriate because:   Extensive left lower extremity DVT with concern for impending cerulea dolens requiring close clinical monitoring with serial neurovascular checks, continuous heparin infusion, expert consultation with vascular surgery and frequent blood testing.        Vernelle Emerald MD [facility] Pager [phone]  If 7PM-7AM, please contact night-coverage [email or web address] Use universal [place] password for that web site. If you do not have the password, please call the hospital operator.  01/08/2021, 1:00 AM

## 2021-01-07 NOTE — Progress Notes (Addendum)
Left lower extremity venous duplex and IVC/iliac vein duplex completed. Refer to "CV Proc" under chart review to view preliminary results.  Preliminary results discussed with Dr. Linus Salmons.  01/07/2021 4:40 PM Kelby Aline., MHA, RVT, RDCS, RDMS

## 2021-01-08 ENCOUNTER — Encounter (HOSPITAL_COMMUNITY): Payer: Self-pay | Admitting: Internal Medicine

## 2021-01-08 DIAGNOSIS — J9610 Chronic respiratory failure, unspecified whether with hypoxia or hypercapnia: Secondary | ICD-10-CM | POA: Diagnosis not present

## 2021-01-08 DIAGNOSIS — Z930 Tracheostomy status: Secondary | ICD-10-CM | POA: Diagnosis not present

## 2021-01-08 DIAGNOSIS — J439 Emphysema, unspecified: Secondary | ICD-10-CM | POA: Diagnosis not present

## 2021-01-08 DIAGNOSIS — I824Y2 Acute embolism and thrombosis of unspecified deep veins of left proximal lower extremity: Secondary | ICD-10-CM | POA: Diagnosis not present

## 2021-01-08 LAB — GLUCOSE, CAPILLARY
Glucose-Capillary: 366 mg/dL — ABNORMAL HIGH (ref 70–99)
Glucose-Capillary: 369 mg/dL — ABNORMAL HIGH (ref 70–99)
Glucose-Capillary: 372 mg/dL — ABNORMAL HIGH (ref 70–99)
Glucose-Capillary: 412 mg/dL — ABNORMAL HIGH (ref 70–99)
Glucose-Capillary: 416 mg/dL — ABNORMAL HIGH (ref 70–99)
Glucose-Capillary: 434 mg/dL — ABNORMAL HIGH (ref 70–99)
Glucose-Capillary: 437 mg/dL — ABNORMAL HIGH (ref 70–99)

## 2021-01-08 LAB — CBC WITH DIFFERENTIAL/PLATELET
Abs Immature Granulocytes: 0.05 10*3/uL (ref 0.00–0.07)
Basophils Absolute: 0 10*3/uL (ref 0.0–0.1)
Basophils Relative: 1 %
Eosinophils Absolute: 0.3 10*3/uL (ref 0.0–0.5)
Eosinophils Relative: 4 %
HCT: 41.8 % (ref 39.0–52.0)
Hemoglobin: 13.7 g/dL (ref 13.0–17.0)
Immature Granulocytes: 1 %
Lymphocytes Relative: 31 %
Lymphs Abs: 2.4 10*3/uL (ref 0.7–4.0)
MCH: 28.2 pg (ref 26.0–34.0)
MCHC: 32.8 g/dL (ref 30.0–36.0)
MCV: 86.2 fL (ref 80.0–100.0)
Monocytes Absolute: 0.6 10*3/uL (ref 0.1–1.0)
Monocytes Relative: 7 %
Neutro Abs: 4.3 10*3/uL (ref 1.7–7.7)
Neutrophils Relative %: 56 %
Platelets: 131 10*3/uL — ABNORMAL LOW (ref 150–400)
RBC: 4.85 MIL/uL (ref 4.22–5.81)
RDW: 14.4 % (ref 11.5–15.5)
WBC: 7.7 10*3/uL (ref 4.0–10.5)
nRBC: 0 % (ref 0.0–0.2)

## 2021-01-08 LAB — COMPREHENSIVE METABOLIC PANEL
ALT: 20 U/L (ref 0–44)
AST: 21 U/L (ref 15–41)
Albumin: 3.3 g/dL — ABNORMAL LOW (ref 3.5–5.0)
Alkaline Phosphatase: 145 U/L — ABNORMAL HIGH (ref 38–126)
Anion gap: 10 (ref 5–15)
BUN: 23 mg/dL — ABNORMAL HIGH (ref 6–20)
CO2: 26 mmol/L (ref 22–32)
Calcium: 8.9 mg/dL (ref 8.9–10.3)
Chloride: 96 mmol/L — ABNORMAL LOW (ref 98–111)
Creatinine, Ser: 1.5 mg/dL — ABNORMAL HIGH (ref 0.61–1.24)
GFR, Estimated: 53 mL/min — ABNORMAL LOW (ref >60)
Glucose, Bld: 403 mg/dL — ABNORMAL HIGH (ref 70–99)
Potassium: 4 mmol/L (ref 3.5–5.1)
Sodium: 132 mmol/L — ABNORMAL LOW (ref 135–145)
Total Bilirubin: 0.9 mg/dL (ref 0.3–1.2)
Total Protein: 7.2 g/dL (ref 6.5–8.1)

## 2021-01-08 LAB — HEMOGLOBIN A1C
Hgb A1c MFr Bld: 11 % — ABNORMAL HIGH (ref 4.8–5.6)
Mean Plasma Glucose: 269 mg/dL

## 2021-01-08 LAB — HEPARIN LEVEL (UNFRACTIONATED)
Heparin Unfractionated: 0.71 IU/mL — ABNORMAL HIGH (ref 0.30–0.70)
Heparin Unfractionated: 0.69 IU/mL (ref 0.30–0.70)

## 2021-01-08 LAB — CBG MONITORING, ED: Glucose-Capillary: 364 mg/dL — ABNORMAL HIGH (ref 70–99)

## 2021-01-08 LAB — MAGNESIUM: Magnesium: 1.9 mg/dL (ref 1.7–2.4)

## 2021-01-08 LAB — HIV ANTIBODY (ROUTINE TESTING W REFLEX): HIV Screen 4th Generation wRfx: NONREACTIVE

## 2021-01-08 MED ORDER — INSULIN ASPART 100 UNIT/ML IJ SOLN: 0.0000 [IU] | Freq: Every day | INTRAMUSCULAR | Status: DC

## 2021-01-08 MED ORDER — INSULIN ASPART 100 UNIT/ML IJ SOLN
20.0000 [IU] | Freq: Once | INTRAMUSCULAR | Status: AC
  Administered 2021-01-08: 22:00:00 20 [IU] via SUBCUTANEOUS

## 2021-01-08 MED ORDER — TORSEMIDE 20 MG PO TABS
10.0000 mg | ORAL_TABLET | Freq: Every day | ORAL | Status: DC
  Administered 2021-01-08 – 2021-01-11 (×4): 10 mg via ORAL
  Filled 2021-01-08 (×4): qty 1

## 2021-01-08 MED ORDER — IPRATROPIUM-ALBUTEROL 0.5-2.5 (3) MG/3ML IN SOLN: 3.0000 mL | RESPIRATORY_TRACT | Status: DC | PRN

## 2021-01-08 MED ORDER — INSULIN ASPART 100 UNIT/ML IJ SOLN
15.0000 [IU] | Freq: Once | INTRAMUSCULAR | Status: AC
Start: 2021-01-08 — End: 2021-01-08
  Administered 2021-01-08: 03:00:00 15 [IU] via SUBCUTANEOUS

## 2021-01-08 MED ORDER — DEXTROSE IN LACTATED RINGERS 5 % IV SOLN: INTRAVENOUS | Status: DC

## 2021-01-08 MED ORDER — SENNOSIDES-DOCUSATE SODIUM 8.6-50 MG PO TABS: 1.0000 | ORAL_TABLET | Freq: Every evening | ORAL | Status: DC | PRN

## 2021-01-08 MED ORDER — MOMETASONE FURO-FORMOTEROL FUM 100-5 MCG/ACT IN AERO
2.0000 | INHALATION_SPRAY | Freq: Two times a day (BID) | RESPIRATORY_TRACT | Status: DC
  Administered 2021-01-08: 10:00:00 2 via RESPIRATORY_TRACT
  Filled 2021-01-08: qty 8.8

## 2021-01-08 MED ORDER — PANTOPRAZOLE SODIUM 40 MG PO TBEC
40.0000 mg | DELAYED_RELEASE_TABLET | Freq: Every day | ORAL | Status: DC
  Administered 2021-01-08 – 2021-01-10 (×4): 40 mg via ORAL
  Filled 2021-01-08 (×4): qty 1

## 2021-01-08 MED ORDER — LIVING WELL WITH DIABETES BOOK
Freq: Once | Status: AC
  Filled 2021-01-08: qty 1

## 2021-01-08 MED ORDER — CHLORHEXIDINE GLUCONATE 0.12 % MT SOLN
15.0000 mL | Freq: Two times a day (BID) | OROMUCOSAL | Status: DC
  Administered 2021-01-08 – 2021-01-11 (×6): 15 mL via OROMUCOSAL
  Filled 2021-01-08 (×6): qty 15

## 2021-01-08 MED ORDER — DM-GUAIFENESIN ER 30-600 MG PO TB12: 1.0000 | ORAL_TABLET | Freq: Two times a day (BID) | ORAL | Status: DC | PRN

## 2021-01-08 MED ORDER — INSULIN ASPART 100 UNIT/ML IJ SOLN
0.0000 [IU] | Freq: Three times a day (TID) | INTRAMUSCULAR | Status: DC
Start: 2021-01-08 — End: 2021-01-08
  Administered 2021-01-08 (×2): 15 [IU] via SUBCUTANEOUS

## 2021-01-08 MED ORDER — DEXTROSE 50 % IV SOLN: 0.0000 mL | INTRAVENOUS | Status: DC | PRN

## 2021-01-08 MED ORDER — BUDESONIDE 0.25 MG/2ML IN SUSP
0.2500 mg | Freq: Two times a day (BID) | RESPIRATORY_TRACT | Status: DC
  Administered 2021-01-08 – 2021-01-11 (×6): 0.25 mg via RESPIRATORY_TRACT
  Filled 2021-01-08 (×6): qty 2

## 2021-01-08 MED ORDER — ATORVASTATIN CALCIUM 10 MG PO TABS
10.0000 mg | ORAL_TABLET | Freq: Every day | ORAL | Status: DC
  Administered 2021-01-08 – 2021-01-11 (×4): 10 mg via ORAL
  Filled 2021-01-08 (×4): qty 1

## 2021-01-08 MED ORDER — ACETAMINOPHEN 650 MG RE SUPP: 650.0000 mg | Freq: Four times a day (QID) | RECTAL | Status: DC | PRN

## 2021-01-08 MED ORDER — INSULIN REGULAR(HUMAN) IN NACL 100-0.9 UT/100ML-% IV SOLN
INTRAVENOUS | Status: DC
  Administered 2021-01-09: 15 [IU]/h via INTRAVENOUS
  Filled 2021-01-08: qty 100

## 2021-01-08 MED ORDER — SERTRALINE HCL 100 MG PO TABS
100.0000 mg | ORAL_TABLET | Freq: Every day | ORAL | Status: DC
  Administered 2021-01-08 – 2021-01-11 (×4): 100 mg via ORAL
  Filled 2021-01-08 (×3): qty 1

## 2021-01-08 MED ORDER — GABAPENTIN 100 MG PO CAPS
200.0000 mg | ORAL_CAPSULE | Freq: Three times a day (TID) | ORAL | Status: DC
  Administered 2021-01-08 – 2021-01-11 (×12): 200 mg via ORAL
  Filled 2021-01-08 (×12): qty 2

## 2021-01-08 MED ORDER — INSULIN GLARGINE-YFGN 100 UNIT/ML ~~LOC~~ SOLN
32.0000 [IU] | Freq: Two times a day (BID) | SUBCUTANEOUS | Status: DC
  Administered 2021-01-08 (×3): 32 [IU] via SUBCUTANEOUS
  Filled 2021-01-08 (×5): qty 0.32

## 2021-01-08 MED ORDER — INSULIN ASPART 100 UNIT/ML IJ SOLN
18.0000 [IU] | Freq: Once | INTRAMUSCULAR | Status: AC
  Administered 2021-01-08: 17:00:00 18 [IU] via SUBCUTANEOUS

## 2021-01-08 MED ORDER — HYDRALAZINE HCL 20 MG/ML IJ SOLN: 10.0000 mg | INTRAMUSCULAR | Status: DC | PRN

## 2021-01-08 MED ORDER — ACETAMINOPHEN 325 MG PO TABS: 650.0000 mg | ORAL_TABLET | Freq: Four times a day (QID) | ORAL | Status: DC | PRN

## 2021-01-08 MED ORDER — QUETIAPINE FUMARATE 100 MG PO TABS
100.0000 mg | ORAL_TABLET | Freq: Two times a day (BID) | ORAL | Status: DC
  Administered 2021-01-08 – 2021-01-11 (×8): 100 mg via ORAL
  Filled 2021-01-08 (×9): qty 1

## 2021-01-08 MED ORDER — OXYCODONE HCL 5 MG PO TABS: 5.0000 mg | ORAL_TABLET | ORAL | Status: DC | PRN

## 2021-01-08 MED ORDER — POLYETHYLENE GLYCOL 3350 17 G PO PACK: 17.0000 g | PACK | Freq: Every day | ORAL | Status: DC | PRN

## 2021-01-08 MED ORDER — METOPROLOL TARTRATE 12.5 MG HALF TABLET
12.5000 mg | ORAL_TABLET | Freq: Two times a day (BID) | ORAL | Status: DC
  Administered 2021-01-08 – 2021-01-11 (×8): 12.5 mg via ORAL
  Filled 2021-01-08 (×8): qty 1

## 2021-01-08 MED ORDER — LACTATED RINGERS IV SOLN: INTRAVENOUS | Status: DC

## 2021-01-08 MED ORDER — ORAL CARE MOUTH RINSE
15.0000 mL | Freq: Two times a day (BID) | OROMUCOSAL | Status: DC
  Administered 2021-01-08 – 2021-01-11 (×5): 15 mL via OROMUCOSAL

## 2021-01-08 MED ORDER — INSULIN ASPART 100 UNIT/ML IJ SOLN: 0.0000 [IU] | Freq: Three times a day (TID) | INTRAMUSCULAR | Status: DC

## 2021-01-08 MED ORDER — METOPROLOL TARTRATE 5 MG/5ML IV SOLN: 5.0000 mg | INTRAVENOUS | Status: DC | PRN

## 2021-01-08 MED ORDER — ALLOPURINOL 100 MG PO TABS
100.0000 mg | ORAL_TABLET | Freq: Every day | ORAL | Status: DC
  Administered 2021-01-08 – 2021-01-11 (×4): 100 mg via ORAL
  Filled 2021-01-08 (×4): qty 1

## 2021-01-08 MED ORDER — ONDANSETRON HCL 4 MG PO TABS: 4.0000 mg | ORAL_TABLET | Freq: Four times a day (QID) | ORAL | Status: DC | PRN

## 2021-01-08 MED ORDER — ONDANSETRON HCL 4 MG/2ML IJ SOLN: 4.0000 mg | Freq: Four times a day (QID) | INTRAMUSCULAR | Status: DC | PRN

## 2021-01-08 MED ORDER — TRAZODONE HCL 50 MG PO TABS
25.0000 mg | ORAL_TABLET | Freq: Every evening | ORAL | Status: DC | PRN
Start: 2021-01-08 — End: 2021-01-11
  Administered 2021-01-09 – 2021-01-10 (×2): 50 mg via ORAL
  Filled 2021-01-08 (×2): qty 1

## 2021-01-08 NOTE — Significant Event (Signed)
BGL was 437 an hour ago.  Gave 20 novolog and 37 lantus.  Despite this, BGL 1 hour later is 412 still!  Will order to DC all [place] insulins and start insulin gtt per protocol to get BGL under control.

## 2021-01-08 NOTE — Progress Notes (Addendum)
Inpatient Diabetes Program Recommendations  AACE/ADA: New Consensus Statement on Inpatient Glycemic Control (2015)  Target Ranges:  Prepandial:   less than 140 mg/dL      Peak postprandial:   less than 180 mg/dL (1-2 hours)      Critically ill patients:  140 - 180 mg/dL   Lab Results  Component Value Date   GLUCAP 372 (H) 01/08/2021   HGBA1C 11.0 (H) 01/08/2021    Review of Glycemic Control  Latest Reference Range & Units 01/07/21 22:01 01/08/21 01:10 01/08/21 06:21 01/08/21 12:01  Glucose-Capillary 70 - 99 mg/dL 303 (H) 364 (H) 369 (H) 372 (H)  (H): Data is abnormally high  Diabetes history: DM2 Outpatient Diabetes medications: Lantus 35 units BID Current orders for Inpatient glycemic control: Semglee 32 units BID, Novolog 0-15 units TID and HS  A1C was 7.4% in October 2022 A1C currently 11%  Spoke with patient at bedside.  Reviewed patient's current A1c of 11% (average blood sugar of 269 mg/dL).   Explained what a A1c is and what it measures. Also reviewed goal A1c with patient, importance of good glucose control @ home, and blood sugar goals.  He states he has not made any changes to his diet.  He admits to drinking beverages with sugar and not eating well.  We discussed The Plate Method, portion control and CHO's and goal CHO's per meal.    He admits to not taking his insulin consistently.  Encouraged him to not skip doses and set an alarm BID as a reminder.  He denies hypoglycemia and is aware of signs, symptoms and how to treat.  Ordered Living Well with Diabetes.    Educated importance of glucose control, long and short term complications.    Will continue to follow while inpatient.  Thank you, Reche Dixon, RN, BSN Diabetes Coordinator Inpatient Diabetes Program [phone] (team pager from 8a-5p)

## 2021-01-08 NOTE — Progress Notes (Signed)
ANTICOAGULATION CONSULT NOTE - Follow Up Consult  Pharmacy Consult for heparin Indication: DVT  No Known Allergies  Patient Measurements: Height: 5\' 8"  (172.7 cm) Weight: (!) 175.8 kg (387 lb 9.1 oz) IBW/kg (Calculated) : 68.4 Heparin Dosing Weight: 115 kg  Vital Signs: Temp: 97.9 F (36.6 C) (12/21 1203) Temp Source: Oral (12/21 1203) BP: 119/70 (12/21 1203) Pulse Rate: 76 (12/21 1203)  Labs: Recent Labs    01/07/21 1426 01/08/21 0324 01/08/21 1118  HGB 15.4 13.7  --   HCT 48.2 41.8  --   PLT 169 131*  --   HEPARINUNFRC  --  0.71* 0.69  CREATININE 1.49* 1.50*  --     Estimated Creatinine Clearance: 83.6 mL/min (A) (by C-G formula based on SCr of 1.5 mg/dL (H)).  Assessment: 39 YOM with acute L DVT. Pharmacy consulted to start IV heparin. Of note patient has a h/o of DVT in Nov, 2021 requiring thrombectomy. He completed treatment with Eliquis in September.    Heparin level is now therapeutic (0.69) on 1850 units/hr.  Noted plan for procedure on 12/23.  Platelet count trended down 169 > 131K.  Will monitor.  Goal of Therapy:  Heparin level 0.3-0.7 units/ml Monitor platelets by anticoagulation protocol: Yes   Plan:  Continue heparin drip at 1850 units/hr Daily heparin level and CBC.  Arty Baumgartner, [place] 01/08/2021,1:35 PM

## 2021-01-08 NOTE — Assessment & Plan Note (Signed)
·   Patient presenting with substantial hyperglycemia and has a longstanding history of poorly controlled diabetes  Initiating home regimen of basal insulin therapy 32 units of glargine twice daily Patient been placed on Accu-Cheks before every meal and nightly with resistant sliding scale insulin Hemoglobin A1C ordered Diabetic Diet

## 2021-01-08 NOTE — Progress Notes (Signed)
°  Transition of Care [facility]) Screening Note   Patient Details  Name: Timothy Le Date of Birth: 1960/07/26   Transition of [facility] CM/SW Contact:    Dawayne Patricia, RN Phone Number: 01/08/2021, 3:48 PM    Transition of Care Department [facility]) has reviewed patient and no TOC needs have been identified at this time. We will continue to monitor patient advancement through interdisciplinary progression rounds. If new patient transition needs arise, please place a TOC consult.

## 2021-01-08 NOTE — Assessment & Plan Note (Signed)
·   Extensive left lower extremity DVT as evidenced by ultrasound of the lower extremities in the emergency department  Concern for impending cerulea dolens  Patient is already been evaluated by Dr. 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 Doren Custard to discuss case with Dr. Donzetta Matters in the morning to discuss potential thrombolytic options  Neurovascular checks in the meantime

## 2021-01-08 NOTE — Assessment & Plan Note (Signed)
•   No clinical evidence of cardiogenic volume overload  Continue home regimen of maintenance torsemide

## 2021-01-08 NOTE — Assessment & Plan Note (Signed)
·   No evidence of increased oxygen requirement, patient currently on room air

## 2021-01-08 NOTE — Assessment & Plan Note (Signed)
·   Due to poor outpatient follow-up patient has a damaged inner cannula  Initiating respiratory consult   Will replace inner cannula  Tracheostomy care per protocol

## 2021-01-08 NOTE — Progress Notes (Signed)
° °  VASCULAR SURGERY ASSESSMENT & PLAN:   EXTENSIVE LEFT LOWER EXTREMITY DVT: This patient has a recurrent left lower extremity DVT.  He is now on IV heparin.  I have written to elevate his leg.  I have scheduled him for venogram, IVUS, and mechanical thrombectomy with possible stenting on Friday.    SUBJECTIVE:   No specific complaints this morning.  PHYSICAL EXAM:   Vitals:   01/08/21 0240 01/08/21 0300 01/08/21 0325 01/08/21 0814  BP: 135/86   (!) 152/72  Pulse: 82  76 78  Resp: 20 18 14 20   Temp: 98.7 F (37.1 C)   98.7 F (37.1 C)  TempSrc: Oral   Oral  SpO2: 97%  97% 90%  Weight: (!) 175.8 kg     Height: 5\' 8"  (1.727 m)      His left leg swelling is about the same.  DATA:   CT ANGIO CHEST: There is no evidence of pulmonary embolus.  CT VENOGRAM ABDOMEN PELVIS: There is poor visualization of the iliac veins but I do suspect that there is clot present despite the radiology report.  There may also be some slight narrowing at the distal aspect of the Wallstent.  Lab Results  Component Value Date   WBC 7.7 01/08/2021   HGB 13.7 01/08/2021   HCT 41.8 01/08/2021   MCV 86.2 01/08/2021   PLT 131 (L) 01/08/2021   Lab Results  Component Value Date   CREATININE 1.50 (H) 01/08/2021   Lab Results  Component Value Date   INR 1.2 08/30/2019   CBG (last 3)  Recent Labs    01/07/21 2201 01/08/21 0110 01/08/21 0621  GLUCAP 303* 364* 369*    PROBLEM LIST:    Principal Problem:   Acute deep vein thrombosis (DVT) of left lower extremity (HCC) Active Problems:   COPD (chronic obstructive pulmonary disease) (HCC)   Type 2 diabetes mellitus with hyperglycemia, with long-term current use of insulin (HCC)   Tracheostomy dependence (HCC)   Chronic respiratory failure due to obstructive sleep apnea (HCC)   Chronic diastolic CHF (congestive heart failure) (HCC)   CURRENT MEDS:    allopurinol  100 mg Oral Daily   atorvastatin  10 mg Oral Daily   gabapentin  200 mg Oral  TID   insulin aspart  0-15 Units Subcutaneous TID AC & HS   insulin glargine-yfgn  32 Units Subcutaneous BID   metoprolol tartrate  12.5 mg Oral BID   mometasone-formoterol  2 puff Inhalation BID   pantoprazole  40 mg Oral QHS   QUEtiapine  100 mg Oral BID   sertraline  100 mg Oral Daily   torsemide  10 mg Oral Daily    Deitra Mayo Office: [phone] 01/08/2021

## 2021-01-08 NOTE — Assessment & Plan Note (Signed)
   No evidence of COPD exacerbation this time  Continue home regimen of maintenance inhalers  As needed bronchodilator therapy for episodic shortness of breath and wheezing.  

## 2021-01-08 NOTE — Assessment & Plan Note (Signed)
>>  ASSESSMENT AND PLAN FOR TYPE 2 DIABETES MELLITUS WITH HYPERGLYCEMIA, WITH LONG-TERM CURRENT USE OF INSULIN (HCC) WRITTEN ON 01/08/2021 12:48 AM BY Marinda Elk, MD  Patient presenting with substantial hyperglycemia and has a longstanding history of poorly controlled diabetes Initiating home regimen of basal insulin therapy 32 units of glargine twice daily Patient been placed on Accu-Cheks before every meal and nightly with resistant sliding scale insulin Hemoglobin A1C ordered Diabetic Diet

## 2021-01-08 NOTE — Progress Notes (Signed)
ANTICOAGULATION CONSULT NOTE   Pharmacy Consult for heparin Indication: DVT  No Known Allergies  Patient Measurements: Height: '5\' 8"'  (172.7 cm) Weight: (!) 175.8 kg (387 lb 9.1 oz) IBW/kg (Calculated) : 68.4 Heparin Dosing Weight: 175 kg   Vital Signs: Temp: 98.7 F (37.1 C) (12/21 0240) Temp Source: Oral (12/21 0240) BP: 135/86 (12/21 0240) Pulse Rate: 76 (12/21 0325)  Labs: Recent Labs    01/07/21 1426 01/08/21 0324  HGB 15.4 13.7  HCT 48.2 41.8  PLT 169 131*  HEPARINUNFRC  --  0.71*  CREATININE 1.49* 1.50*     Estimated Creatinine Clearance: 83.6 mL/min (A) (by C-G formula based on SCr of 1.5 mg/dL (H)).   Medical History: Past Medical History:  Diagnosis Date   Asthma    Chronic respiratory failure with hypoxia ([place]) 07/09/2017   COPD (chronic obstructive pulmonary disease) (HCC)    Diabetes mellitus without complication (HCC)    Difficult intubation    Hypertension    Shortness of breath dyspnea    Sleep apnea     Medications:  Medications Prior to Admission  Medication Sig Dispense Refill Last Dose   acetaminophen (TYLENOL) 325 MG tablet Take 1-2 tablets (325-650 mg total) by mouth every 4 (four) hours as needed for mild pain.      allopurinol (ZYLOPRIM) 100 MG tablet Take 1 tablet (100 mg total) by mouth daily. 90 tablet 3    alum & mag hydroxide-simeth (MAALOX/MYLANTA) 200-200-20 MG/5ML suspension Take 30 mLs by mouth every 4 (four) hours as needed for indigestion, heartburn or flatulence. (Patient not taking: No sig reported) 355 mL 0    atorvastatin (LIPITOR) 10 MG tablet Take 1 tablet (10 mg total) by mouth daily. 90 tablet 3    BD INSULIN SYRINGE U/F 31G X 5/16" 0.5 ML MISC USE FOR DIABETIC TESTING      blood glucose meter kit and supplies Dispense based on patient and insurance preference. Use up to four times daily as directed. (FOR ICD-10 E10.9, E11.9). 1 each 0    diclofenac Sodium (VOLTAREN) 1 % GEL Apply 2 g topically 4 (four) times daily.  150 g 5    gabapentin (NEURONTIN) 100 MG capsule Take 2 capsules (200 mg total) by mouth 3 (three) times daily. 540 capsule 3    Insulin Syringes, Disposable, U-100 0.5 ML MISC 1 Syringe by Does not apply route 3 (three) times daily after meals. 100 each 11    ipratropium-albuterol (DUONEB) 0.5-2.5 (3) MG/3ML SOLN Take 3 mLs by nebulization every 6 (six) hours as needed. 360 mL 1    LANTUS 100 UNIT/ML injection Inject 0.32 mLs (32 Units total) into the skin 2 (two) times daily. 57.6 mL 3    metoprolol tartrate (LOPRESSOR) 25 MG tablet Take 0.5 tablets (12.5 mg total) by mouth 2 (two) times daily. 90 tablet 3    mometasone-formoterol (DULERA) 100-5 MCG/ACT AERO Inhale 2 puffs into the lungs 2 (two) times daily for 8 days. 8 g 2    nutrition supplement, JUVEN, (JUVEN) PACK Take 1 packet by mouth 2 (two) times daily between meals. (Patient not taking: No sig reported) 60 packet 0    pantoprazole (PROTONIX) 40 MG tablet Take 1 tablet (40 mg total) by mouth at bedtime. 90 tablet 3    potassium chloride SA (KLOR-CON) 20 MEQ tablet Take 1 tablet (20 mEq total) by mouth 2 (two) times daily. 180 tablet 3    QUEtiapine (SEROQUEL) 100 MG tablet Take 1 tablet (100 mg total) by  mouth 2 (two) times daily. 180 tablet 3    sertraline (ZOLOFT) 100 MG tablet Take 1 tablet (100 mg total) by mouth daily. 90 tablet 3    torsemide (DEMADEX) 10 MG tablet Take 1 tablet (10 mg total) by mouth daily. 90 tablet 3    traZODone (DESYREL) 50 MG tablet TAKE 0.5-1 TABLETS (25-50 MG TOTAL) BY MOUTH AT BEDTIME AS NEEDED FOR SLEEP. 15 tablet 11    triamcinolone ointment (KENALOG) 0.5 % Apply 1 application topically 2 (two) times daily. 30 g 0     Assessment: 54 YOM with acute L DVT. Pharmacy consulted to start IV heparin. Of note patient has a h/o of DVT in Nov, 2021 requiring thrombectomy. He completed treatment with Eliquis in September.   H/H and Plt wnl. SCr mildly elevated   12/21 AM update:  Heparin level just above  goal  Goal of Therapy:  Heparin level 0.3-0.7 units/ml Monitor platelets by anticoagulation protocol: Yes   Plan:  -Dec heparin to 1850 units/hr -1200 heparin level  Narda Bonds, PharmD, BCPS Clinical Pharmacist Phone: [phone]

## 2021-01-08 NOTE — Progress Notes (Signed)
PROGRESS NOTE    Timothy Le  YDX:[id number] DOB: 10/21/1960 DOA: 01/07/2021 PCP: Vevelyn Francois, NP   Brief Narrative:  60 year old with history of DM2, morbid obesity, chronic hypoxia, severe OSA requiring trach placement, HTN, diastolic CHF, extensive left lower extremity DVT status post mechanical thrombectomy, angioplasty in November 2021 admitted for left lower extremity swelling.  He has been off Eliquis for 6 months, ultrasound back in November 2022 was negative for DVT therefore not restarted but now presents back again with extensive lower extremity DVT.  Vascular team is following the patient.   Assessment & Plan:   Principal Problem:   Acute deep vein thrombosis (DVT) of left lower extremity (HCC) Active Problems:   COPD (chronic obstructive pulmonary disease) (HCC)   Type 2 diabetes mellitus with hyperglycemia, with long-term current use of insulin (HCC)   Tracheostomy dependence (HCC)   Chronic respiratory failure due to obstructive sleep apnea (HCC)   Chronic diastolic CHF (congestive heart failure) (HCC)   Acute DVT of left lower extremity - Currently patient is on heparin drip.  Vascular team to discuss mechanical thrombectomy versus thrombolytics.  Continue supportive care and pain control. -Should be on lifelong anticoagulation unless contraindicated  Diabetes mellitus type 2, insulin-dependent. Uncontrolled due to hyperglycemia -A1c-11.0 - Continue home long-acting insulin, sliding scale and Accu-Chek. DM cooridnator consulted.   COPD with chronic respiratory failure Tracheostomy dependent - As needed bronchodilators.  Supportive care.  Patient is trach dependent, routine trach care  Chronic diastolic congestive heart failure - Continue daily torsemide.  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   Subjective: Patient feels okay does not have any complaints at this time  Review of Systems Otherwise negative except as per HPI, including: General: Denies fever, chills, night sweats or unintended weight loss. Resp: Denies cough, wheezing, shortness of breath. Cardiac: Denies chest pain, palpitations, orthopnea, paroxysmal nocturnal dyspnea. GI: Denies abdominal pain, nausea, vomiting, diarrhea or constipation GU: Denies dysuria, frequency, hesitancy or incontinence MS: Denies muscle aches, joint pain or swelling Neuro: Denies headache, neurologic deficits (focal weakness, numbness, tingling), abnormal gait Psych: Denies anxiety, depression, SI/HI/AVH Skin: Denies new rashes or lesions ID: Denies sick contacts, exotic exposures, travel  Examination:  General exam: Appears calm and comfortable, morbidly obese Respiratory system: Clear to auscultation. Respiratory effort normal. Cardiovascular system: S1 & S2 heard, RRR. No JVD, murmurs, rubs, gallops or clicks. No pedal edema. Gastrointestinal system: Abdomen is nondistended, soft and nontender. No organomegaly or masses felt. Normal bowel sounds heard. Central nervous system: Alert and oriented. No focal neurological deficits. Extremities: Symmetric 5 x 5 power. Skin: No rashes, lesions or ulcers Psychiatry: Judgement and insight appear normal. Mood & affect appropriate.  Tracheostomy in place   Objective: Vitals:   01/08/21 0045 01/08/21 0240 01/08/21 0300 01/08/21 0325  BP: 135/82 135/86    Pulse: 83 82  76  Resp:  20 18 14   Temp:  98.7 F (37.1 C)    TempSrc:  Oral    SpO2: 96% 97%  97%  Weight:  (!) 175.8 kg    Height:  5\' 8"  (1.727 m)      Intake/Output Summary (Last 24 hours) at 01/08/2021 7209 Last data filed at 01/08/2021 0658 Gross per 24 hour  Intake  790 ml  Output 200 ml  Net 590 ml   Filed Weights    01/08/21 0240  Weight: (!) 175.8 kg     Data Reviewed:   CBC: Recent Labs  Lab 01/07/21 1426 01/08/21 0324  WBC 8.7 7.7  NEUTROABS 4.0 4.3  HGB 15.4 13.7  HCT 48.2 41.8  MCV 88.8 86.2  PLT 169 119*   Basic Metabolic Panel: Recent Labs  Lab 01/07/21 1426 01/08/21 0324  NA 132* 132*  K 4.5 4.0  CL 93* 96*  CO2 28 26  GLUCOSE 509* 403*  BUN 25* 23*  CREATININE 1.49* 1.50*  CALCIUM 9.1 8.9  MG  --  1.9   GFR: Estimated Creatinine Clearance: 83.6 mL/min (A) (by C-G formula based on SCr of 1.5 mg/dL (H)). Liver Function Tests: Recent Labs  Lab 01/08/21 0324  AST 21  ALT 20  ALKPHOS 145*  BILITOT 0.9  PROT 7.2  ALBUMIN 3.3*   No results for input(s): LIPASE, AMYLASE in the last 168 hours. No results for input(s): AMMONIA in the last 168 hours. Coagulation Profile: No results for input(s): INR, PROTIME in the last 168 hours. Cardiac Enzymes: No results for input(s): CKTOTAL, CKMB, CKMBINDEX, TROPONINI in the last 168 hours. BNP (last 3 results) No results for input(s): PROBNP in the last 8760 hours. HbA1C: Recent Labs    01/08/21 0324  HGBA1C 11.0*   CBG: Recent Labs  Lab 01/07/21 2201 01/08/21 0110 01/08/21 0621  GLUCAP 303* 364* 369*   Lipid Profile: No results for input(s): CHOL, HDL, LDLCALC, TRIG, CHOLHDL, LDLDIRECT in the last 72 hours. Thyroid Function Tests: No results for input(s): TSH, T4TOTAL, FREET4, T3FREE, THYROIDAB in the last 72 hours. Anemia Panel: No results for input(s): VITAMINB12, FOLATE, FERRITIN, TIBC, IRON, RETICCTPCT in the last 72 hours. Sepsis Labs: No results for input(s): PROCALCITON, LATICACIDVEN in the last 168 hours.  Recent Results (from the past 240 hour(s))  Resp Panel by RT-PCR (Flu A&B, Covid) Nasopharyngeal Swab     Status: None   Collection Time: 01/07/21  5:02 PM   Specimen: Nasopharyngeal Swab; Nasopharyngeal(NP) swabs in vial transport medium  Result Value Ref Range Status   SARS Coronavirus 2 by RT  PCR NEGATIVE NEGATIVE Final    Comment: (NOTE) SARS-CoV-2 target nucleic acids are NOT DETECTED.  The SARS-CoV-2 RNA is generally detectable in upper respiratory specimens during the acute phase of infection. The lowest concentration of SARS-CoV-2 viral copies this assay can detect is 138 copies/mL. A negative result does not preclude SARS-Cov-2 infection and should not be used as the sole basis for treatment or other patient management decisions. A negative result may occur with  improper specimen collection/handling, submission of specimen other than nasopharyngeal swab, presence of viral mutation(s) within the areas targeted by this assay, and inadequate number of viral copies(<138 copies/mL). A negative result must be combined with clinical observations, patient history, and epidemiological information. The expected result is Negative.  Fact Sheet for Patients:  EntrepreneurPulse.com.au  Fact Sheet for Healthcare Providers:  IncredibleEmployment.be  This test is no t yet approved or cleared by the [place] FDA and  has been authorized for detection and/or diagnosis of SARS-CoV-2 by FDA under an Emergency Use Authorization (EUA). This EUA will remain  in effect (meaning this test can be used) for the duration of the COVID-19 declaration under Section 564(b)(1) of the Act, 21 U.S.C.section 360bbb-3(b)(1), unless the authorization is terminated  or revoked sooner.       Influenza A by  PCR NEGATIVE NEGATIVE Final   Influenza B by PCR NEGATIVE NEGATIVE Final    Comment: (NOTE) The Xpert Xpress SARS-CoV-2/FLU/RSV plus assay is intended as an aid in the diagnosis of influenza from Nasopharyngeal swab specimens and should not be used as a sole basis for treatment. Nasal washings and aspirates are unacceptable for Xpert Xpress SARS-CoV-2/FLU/RSV testing.  Fact Sheet for Patients: EntrepreneurPulse.com.au  Fact Sheet for  Healthcare Providers: IncredibleEmployment.be  This test is not yet approved or cleared by the [place] FDA and has been authorized for detection and/or diagnosis of SARS-CoV-2 by FDA under an Emergency Use Authorization (EUA). This EUA will remain in effect (meaning this test can be used) for the duration of the COVID-19 declaration under Section 564(b)(1) of the Act, 21 U.S.C. section 360bbb-3(b)(1), unless the authorization is terminated or revoked.  Performed at [facility], [place] [address] [place], [place] [address]          Radiology Studies: CT Angio Chest PE W and/or Wo Contrast  Result Date: 01/07/2021 CLINICAL DATA:  Concern for pulmonary embolism. Concern for deep venous thrombosis in the iliac veins. EXAM: CT ANGIOGRAPHY CHEST CT venogram ABDOMEN AND PELVIS WITH CONTRAST TECHNIQUE: Multidetector CT imaging of the chest was performed using the standard protocol during bolus administration of intravenous contrast. Multiplanar CT image reconstructions and MIPs were obtained to evaluate the vascular anatomy. Multidetector CT imaging of the abdomen and pelvis was performed using the delayed standard protocol during bolus administration of intravenous contrast. CONTRAST:  173mL OMNIPAQUE IOHEXOL 350 MG/ML SOLN COMPARISON:  None. FINDINGS: CTA CHEST FINDINGS Cardiovascular: No filling defects within the pulmonary arteries to suggest acute pulmonary embolism. No significant vascular findings. Normal heart size. No pericardial effusion. Mediastinum/Nodes: No axillary or supraclavicular adenopathy. No mediastinal or hilar adenopathy. No pericardial fluid. Esophagus normal. Lungs/Pleura: No pulmonary infarction. Mild atelectasis LEFT lower lobe. No pneumonia. Musculoskeletal: Degenerative osteophytosis of the spine. Review of the MIP images confirms the above findings. CT ABDOMEN and PELVIS FINDINGS Hepatobiliary: Liver is normal.  Small sludge in the  gallbladder. Pancreas: Pancreas is normal. No ductal dilatation. No pancreatic inflammation. Spleen: Normal spleen Adrenals/urinary tract: Adrenal glands and kidneys are normal. The ureters and bladder normal. Stomach/Bowel: Stomach, small bowel, appendix, and cecum are normal. The colon and rectosigmoid colon are normal. Vascular/Lymphatic: Abdominal aorta normal. Vascular stent within the LEFT common iliac vein. No evidence of thrombosis within the iliac veins although the bolus timing does not well opacified the veins. Reproductive: Unremarkable Other: No free fluid. Musculoskeletal: No aggressive osseous lesion. Review of the MIP images confirms the above findings. IMPRESSION: Chest Impression: 1. No evidence acute pulmonary embolism. 2. No acute pulmonary parenchymal findings. Abdomen / Pelvis Impression: 1. No evidence of venous thrombosis in iliac veins although they are not well opacified. 2. LEFT common iliac vein stent is expanded. Electronically Signed   By: Suzy Bouchard M.D.   On: 01/07/2021 21:43   VAS [place] IVC/ILIAC (VENOUS ONLY)  Result Date: 01/07/2021 IVC/ILIAC STUDY Patient Name:  BRACK SHADDOCK  Date of Exam:   01/07/2021 Medical Rec #: [id number]         Accession #:    [id number] Date of Birth: 1960-06-09         Patient Gender: M Patient Age:   55 years Exam Location:  [facility] Procedure:      VAS [place] IVC/ILIAC (VENOUS ONLY) Referring Phys: Carlisle Cater --------------------------------------------------------------------------------  Indications: Left lower extremity DVT Other Factors: History of  DVT. Limitations: Air/bowel gas and obesity.  Comparison Study: 12/07/2019 IVC/iliac vein duplex Performing Technologist: Maudry Mayhew MHA, RDMS, RVT, RDCS  Examination Guidelines: A complete evaluation includes B-mode imaging, spectral Doppler, color Doppler, and power Doppler as needed of all accessible portions of each vessel. Bilateral testing is considered an integral part  of a complete examination. Limited examinations for reoccurring indications may be performed as noted.  IVC/Iliac Findings: +----------+------+--------+--------+     IVC     Patent Thrombus Comments  +----------+------+--------+--------+  IVC Prox   patent                    +----------+------+--------+--------+  IVC Mid    patent                    +----------+------+--------+--------+  IVC Distal patent                    +----------+------+--------+--------+  Unable to visualize bilateral CIVs +-------------------------+---------+-----------+---------+-----------+--------+             EIV            RT-Patent RT-Thrombus LT-Patent LT-Thrombus Comments  +-------------------------+---------+-----------+---------+-----------+--------+  External Iliac Vein                                          acute               Distal                                                                          +-------------------------+---------+-----------+---------+-----------+--------+   Summary: IVC/Iliac: There is no evidence of thrombus involving the IVC. There is no evidence of thrombus involving the right external iliac vein. There is evidence of acute thrombus involving the left external iliac vein. Unable to visualzie bilatearl common iliac veins.  *See table(s) above for measurements and observations.  Electronically signed by Deitra Mayo MD on 01/07/2021 at 28:14:18 PM.    Final    CT VENOGRAM ABD/PEL  Result Date: 01/07/2021 CLINICAL DATA:  Concern for pulmonary embolism. Concern for deep venous thrombosis in the iliac veins. EXAM: CT ANGIOGRAPHY CHEST CT venogram ABDOMEN AND PELVIS WITH CONTRAST TECHNIQUE: Multidetector CT imaging of the chest was performed using the standard protocol during bolus administration of intravenous contrast. Multiplanar CT image reconstructions and MIPs were obtained to evaluate the vascular anatomy. Multidetector CT imaging of the abdomen and pelvis was performed using the  delayed standard protocol during bolus administration of intravenous contrast. CONTRAST:  121mL OMNIPAQUE IOHEXOL 350 MG/ML SOLN COMPARISON:  None. FINDINGS: CTA CHEST FINDINGS Cardiovascular: No filling defects within the pulmonary arteries to suggest acute pulmonary embolism. No significant vascular findings. Normal heart size. No pericardial effusion. Mediastinum/Nodes: No axillary or supraclavicular adenopathy. No mediastinal or hilar adenopathy. No pericardial fluid. Esophagus normal. Lungs/Pleura: No pulmonary infarction. Mild atelectasis LEFT lower lobe. No pneumonia. Musculoskeletal: Degenerative osteophytosis of the spine. Review of the MIP images confirms the above findings. CT ABDOMEN and PELVIS FINDINGS Hepatobiliary: Liver is normal.  Small sludge in the gallbladder. Pancreas: Pancreas is normal. No ductal dilatation. No pancreatic inflammation.  Spleen: Normal spleen Adrenals/urinary tract: Adrenal glands and kidneys are normal. The ureters and bladder normal. Stomach/Bowel: Stomach, small bowel, appendix, and cecum are normal. The colon and rectosigmoid colon are normal. Vascular/Lymphatic: Abdominal aorta normal. Vascular stent within the LEFT common iliac vein. No evidence of thrombosis within the iliac veins although the bolus timing does not well opacified the veins. Reproductive: Unremarkable Other: No free fluid. Musculoskeletal: No aggressive osseous lesion. Review of the MIP images confirms the above findings. IMPRESSION: Chest Impression: 1. No evidence acute pulmonary embolism. 2. No acute pulmonary parenchymal findings. Abdomen / Pelvis Impression: 1. No evidence of venous thrombosis in iliac veins although they are not well opacified. 2. LEFT common iliac vein stent is expanded. Electronically Signed   By: Suzy Bouchard M.D.   On: 01/07/2021 21:43   VAS [place] LOWER EXTREMITY VENOUS (DVT) (ONLY [facility] & [facility])  Result Date: 01/07/2021  Lower Venous DVT Study Patient Name:  XAVIUS SPADAFORE   Date of Exam:   01/07/2021 Medical Rec #: [id number]         Accession #:    [id number] Date of Birth: [birth year]         Patient Gender: M Patient Age:   43 years Exam Location:  [facility] Procedure:      VAS [place] LOWER EXTREMITY VENOUS (DVT) Referring Phys: Vonna Kotyk GEIPLE --------------------------------------------------------------------------------  Indications: Edema, and Pain.  Comparison       12/07/2019- age indeterminate DVT throughout left lower Study:           extremity Performing Technologist: Maudry Mayhew MHA, RDMS, RVT, RDCS  Examination Guidelines: A complete evaluation includes B-mode imaging, spectral Doppler, color Doppler, and power Doppler as needed of all accessible portions of each vessel. Bilateral testing is considered an integral part of a complete examination. Limited examinations for reoccurring indications may be performed as noted. The reflux portion of the exam is performed with the patient in reverse Trendelenburg.  +-----+---------------+---------+-----------+----------+--------------+  RIGHT Compressibility Phasicity Spontaneity Properties Thrombus Aging  +-----+---------------+---------+-----------+----------+--------------+  CFV   Full            Yes       Yes                                    +-----+---------------+---------+-----------+----------+--------------+   +---------+---------------+---------+-----------+----------+--------------+  LEFT      Compressibility Phasicity Spontaneity Properties Thrombus Aging  +---------+---------------+---------+-----------+----------+--------------+  CFV       None                      No                     Acute           +---------+---------------+---------+-----------+----------+--------------+  SFJ       None                                             Acute           +---------+---------------+---------+-----------+----------+--------------+  FV Prox   None                      No  Acute            +---------+---------------+---------+-----------+----------+--------------+  FV Mid                              No                                     +---------+---------------+---------+-----------+----------+--------------+  FV Distal                           No                                     +---------+---------------+---------+-----------+----------+--------------+  PFV       None                      No                     Acute           +---------+---------------+---------+-----------+----------+--------------+  POP       None                      No                     Acute           +---------+---------------+---------+-----------+----------+--------------+   Left Technical Findings: Not visualized segments include PTV, peroneal veins.   Summary: LEFT: - Findings consistent with acute deep vein thrombosis involving the left common femoral vein, SF junction, left femoral vein, left proximal profunda vein, and left popliteal vein. - No cystic structure found in the popliteal fossa.  *See table(s) above for measurements and observations. Electronically signed by Deitra Mayo MD on 01/07/2021 at 6:11:52 PM.    Final         Scheduled Meds:  allopurinol  100 mg Oral Daily   atorvastatin  10 mg Oral Daily   gabapentin  200 mg Oral TID   insulin aspart  0-15 Units Subcutaneous TID AC & HS   insulin glargine-yfgn  32 Units Subcutaneous BID   metoprolol tartrate  12.5 mg Oral BID   mometasone-formoterol  2 puff Inhalation BID   pantoprazole  40 mg Oral QHS   QUEtiapine  100 mg Oral BID   sertraline  100 mg Oral Daily   torsemide  10 mg Oral Daily   Continuous Infusions:  heparin 1,850 Units/hr (01/08/21 0658)     LOS: 1 day   Time spent= 35 mins    Bo Rogue Arsenio Loader, MD [facility]  If 7PM-7AM, please contact night-coverage  01/08/2021, 8:08 AM

## 2021-01-09 DIAGNOSIS — Z930 Tracheostomy status: Secondary | ICD-10-CM | POA: Diagnosis not present

## 2021-01-09 DIAGNOSIS — J9610 Chronic respiratory failure, unspecified whether with hypoxia or hypercapnia: Secondary | ICD-10-CM | POA: Diagnosis not present

## 2021-01-09 DIAGNOSIS — J439 Emphysema, unspecified: Secondary | ICD-10-CM | POA: Diagnosis not present

## 2021-01-09 DIAGNOSIS — I824Y2 Acute embolism and thrombosis of unspecified deep veins of left proximal lower extremity: Secondary | ICD-10-CM | POA: Diagnosis not present

## 2021-01-09 LAB — CBC
HCT: 42.4 % (ref 39.0–52.0)
Hemoglobin: 13.7 g/dL (ref 13.0–17.0)
MCH: 28.2 pg (ref 26.0–34.0)
MCHC: 32.3 g/dL (ref 30.0–36.0)
MCV: 87.4 fL (ref 80.0–100.0)
Platelets: 143 10*3/uL — ABNORMAL LOW (ref 150–400)
RBC: 4.85 MIL/uL (ref 4.22–5.81)
RDW: 14.4 % (ref 11.5–15.5)
WBC: 6.5 10*3/uL (ref 4.0–10.5)
nRBC: 0 % (ref 0.0–0.2)

## 2021-01-09 LAB — BASIC METABOLIC PANEL
Anion gap: 8 (ref 5–15)
BUN: 19 mg/dL (ref 6–20)
CO2: 27 mmol/L (ref 22–32)
Calcium: 8.4 mg/dL — ABNORMAL LOW (ref 8.9–10.3)
Chloride: 96 mmol/L — ABNORMAL LOW (ref 98–111)
Creatinine, Ser: 1.29 mg/dL — ABNORMAL HIGH (ref 0.61–1.24)
GFR, Estimated: >60 mL/min (ref >60)
Glucose, Bld: 210 mg/dL — ABNORMAL HIGH (ref 70–99)
Potassium: 3.6 mmol/L (ref 3.5–5.1)
Sodium: 131 mmol/L — ABNORMAL LOW (ref 135–145)

## 2021-01-09 LAB — GLUCOSE, CAPILLARY
Glucose-Capillary: 265 mg/dL — ABNORMAL HIGH (ref 70–99)
Glucose-Capillary: 140 mg/dL — ABNORMAL HIGH (ref 70–99)
Glucose-Capillary: 127 mg/dL — ABNORMAL HIGH (ref 70–99)
Glucose-Capillary: 162 mg/dL — ABNORMAL HIGH (ref 70–99)
Glucose-Capillary: 190 mg/dL — ABNORMAL HIGH (ref 70–99)
Glucose-Capillary: 234 mg/dL — ABNORMAL HIGH (ref 70–99)
Glucose-Capillary: 321 mg/dL — ABNORMAL HIGH (ref 70–99)
Glucose-Capillary: 313 mg/dL — ABNORMAL HIGH (ref 70–99)
Glucose-Capillary: 268 mg/dL — ABNORMAL HIGH (ref 70–99)

## 2021-01-09 LAB — PROTEIN S ACTIVITY: Protein S Activity: 104 % (ref 63–140)

## 2021-01-09 LAB — LUPUS ANTICOAGULANT PANEL
DRVVT: 51.5 s — ABNORMAL HIGH (ref 0.0–47.0)
PTT Lupus Anticoagulant: 32.9 s (ref 0.0–51.9)

## 2021-01-09 LAB — BETA-2-GLYCOPROTEIN I ABS, IGG/M/A
Beta-2 Glyco I IgG: <9 GPI IgG units (ref 0–20)
Beta-2-Glycoprotein I IgA: <9 GPI IgA units (ref 0–25)
Beta-2-Glycoprotein I IgM: <9 GPI IgM units (ref 0–32)

## 2021-01-09 LAB — HOMOCYSTEINE: Homocysteine: 16.3 umol/L — ABNORMAL HIGH (ref 0.0–14.5)

## 2021-01-09 LAB — HEPARIN LEVEL (UNFRACTIONATED)
Heparin Unfractionated: 0.84 IU/mL — ABNORMAL HIGH (ref 0.30–0.70)
Heparin Unfractionated: 0.64 IU/mL (ref 0.30–0.70)

## 2021-01-09 LAB — DRVVT MIX: dRVVT Mix: 44.4 s — ABNORMAL HIGH (ref 0.0–40.4)

## 2021-01-09 LAB — PROTEIN C ACTIVITY: Protein C Activity: 155 % (ref 73–180)

## 2021-01-09 LAB — MAGNESIUM: Magnesium: 2 mg/dL (ref 1.7–2.4)

## 2021-01-09 LAB — PROTEIN S, TOTAL: Protein S Ag, Total: 170 % — ABNORMAL HIGH (ref 60–150)

## 2021-01-09 LAB — DRVVT CONFIRM: dRVVT Confirm: 1.2 ratio (ref 0.8–1.2)

## 2021-01-09 MED ORDER — INSULIN GLARGINE-YFGN 100 UNIT/ML ~~LOC~~ SOLN
40.0000 [IU] | Freq: Two times a day (BID) | SUBCUTANEOUS | Status: DC
  Administered 2021-01-09 (×2): 40 [IU] via SUBCUTANEOUS
  Filled 2021-01-09 (×4): qty 0.4

## 2021-01-09 MED ORDER — INSULIN ASPART 100 UNIT/ML IJ SOLN
0.0000 [IU] | Freq: Three times a day (TID) | INTRAMUSCULAR | Status: DC
  Administered 2021-01-09: 13:00:00 15 [IU] via SUBCUTANEOUS
  Administered 2021-01-09: 06:00:00 7 [IU] via SUBCUTANEOUS
  Administered 2021-01-09: 17:00:00 15 [IU] via SUBCUTANEOUS
  Administered 2021-01-10: 17:00:00 4 [IU] via SUBCUTANEOUS
  Administered 2021-01-10: 07:00:00 11 [IU] via SUBCUTANEOUS
  Administered 2021-01-11: 13:00:00 4 [IU] via SUBCUTANEOUS
  Administered 2021-01-11: 07:00:00 15 [IU] via SUBCUTANEOUS

## 2021-01-09 MED ORDER — INSULIN ASPART 100 UNIT/ML IJ SOLN
6.0000 [IU] | Freq: Three times a day (TID) | INTRAMUSCULAR | Status: DC
  Administered 2021-01-09 – 2021-01-11 (×3): 6 [IU] via SUBCUTANEOUS

## 2021-01-09 MED ORDER — INSULIN GLARGINE-YFGN 100 UNIT/ML ~~LOC~~ SOLN
37.0000 [IU] | Freq: Two times a day (BID) | SUBCUTANEOUS | Status: DC
  Filled 2021-01-09 (×2): qty 0.37

## 2021-01-09 MED ORDER — POTASSIUM CHLORIDE CRYS ER 20 MEQ PO TBCR
40.0000 meq | EXTENDED_RELEASE_TABLET | Freq: Once | ORAL | Status: AC
  Administered 2021-01-09: 08:00:00 40 meq via ORAL
  Filled 2021-01-09: qty 2

## 2021-01-09 NOTE — Progress Notes (Signed)
ANTICOAGULATION CONSULT NOTE - Follow Up Consult  Pharmacy Consult for Heparin Indication:  new VTE  No Known Allergies  Patient Measurements: Height: 5\' 8"  (172.7 cm) Weight: (!) 175.8 kg (387 lb 9.1 oz) IBW/kg (Calculated) : 68.4 Heparin Dosing Weight: 112.6kg  Vital Signs: Temp: 98 F (36.7 C) (12/22 1224) Temp Source: Oral (12/22 1224) BP: 133/74 (12/22 1224) Pulse Rate: 64 (12/22 1224)  Labs: Recent Labs    01/07/21 1426 01/07/21 1426 01/08/21 0324 01/08/21 1118 01/09/21 0547 01/09/21 1512  HGB 15.4  --  13.7  --  13.7  --   HCT 48.2  --  41.8  --  42.4  --   PLT 169  --  131*  --  143*  --   HEPARINUNFRC  --    < > 0.71* 0.69 0.84* 0.64  CREATININE 1.49*  --  1.50*  --  1.29*  --    < > = values in this interval not displayed.     Estimated Creatinine Clearance: 97.2 mL/min (A) (by C-G formula based on SCr of 1.29 mg/dL (H)).   Assessment: Timothy Le now on IV heparin for recurrent/acute LLE DVT per duplex 12/20.  IVC stent patent per duplex 12/20.  Heparin level came back therapeutic at 0.64, on 1650 units/hr. Hgb 13.7, plt 143. No s/sx of bleeding or infusion issues.   Goal of Therapy:  Heparin level 0.3-0.7 units/ml Monitor platelets by anticoagulation protocol: Yes   Plan:  Continue IV heparin at 1650 units/hr Monitor daily HL, CBC, and for s/sx of bleeding   Antonietta Jewel, PharmD, [place]  Phone: [phone] 01/09/2021 3:55 PM  Please check AMION for all [place] phone numbers After 10:00 PM, call [place] [phone]

## 2021-01-09 NOTE — Progress Notes (Addendum)
°  Progress Note  VASCULAR SURGERY ASSESSMENT & PLAN:   RECURRENT LEFT LOWER EXTREMITY DVT: This patient developed a recurrent left lower extremity DVT which appears to involve his left external iliac vein, common femoral vein, femoral vein, and popliteal vein.  He is currently on IV heparin.  He is scheduled for a venogram, IVUS, mechanical thrombectomy, and possible stenting tomorrow in the PV lab.  I have discussed the indications for the procedure and potential complications and he is agreeable to proceed.  His GFR is 85.  Continue gentle hydration.  Gae Gallop, MD 8:21 AM    01/09/2021 7:10 AM Hospital Day 1  Subjective:  sitting up eating.  Says he feels better   afebrile  Vitals:   01/09/21 0324 01/09/21 0338  BP:  (!) 145/76  Pulse:  75  Resp: (!) 22 20  Temp:  98 F (36.7 C)  SpO2: 94% 91%    Physical Exam: General:  no distress Lungs:  non labored Extremities:  left foot warm and well perfused.   CBC    Component Value Date/Time   WBC 6.5 01/09/2021 0547   RBC 4.85 01/09/2021 0547   HGB 13.7 01/09/2021 0547   HCT 42.4 01/09/2021 0547   PLT 143 (L) 01/09/2021 0547   MCV 87.4 01/09/2021 0547   MCH 28.2 01/09/2021 0547   MCHC 32.3 01/09/2021 0547   RDW 14.4 01/09/2021 0547   LYMPHSABS 2.4 01/08/2021 0324   MONOABS 0.6 01/08/2021 0324   EOSABS 0.3 01/08/2021 0324   BASOSABS 0.0 01/08/2021 0324    BMET    Component Value Date/Time   NA 132 (L) 01/08/2021 0324   NA 143 04/11/2020 1029   K 4.0 01/08/2021 0324   CL 96 (L) 01/08/2021 0324   CO2 26 01/08/2021 0324   GLUCOSE 403 (H) 01/08/2021 0324   BUN 23 (H) 01/08/2021 0324   BUN 15 04/11/2020 1029   CREATININE 1.50 (H) 01/08/2021 0324   CALCIUM 8.9 01/08/2021 0324   GFRNONAA 53 (L) 01/08/2021 0324   GFRAA 85 10/12/2019 0904    INR    Component Value Date/Time   INR 1.2 08/30/2019 0258     Intake/Output Summary (Last 24 hours) at 01/09/2021 0710 Last data filed at 01/09/2021  5462 Gross per 24 hour  Intake 1124.47 ml  Output 2100 ml  Net -975.53 ml     Assessment/Plan:  60 y.o. male with extensive LLE DVT Hospital Day 1  -pt states his leg is feeling better.  Plan for mechanical thrombectomy tomorrow.  Needs to elevate legs.  -continue heparin -npo/consent/labs placed  Leontine Locket, PA-C [facility] [phone] 01/09/2021 7:10 AM

## 2021-01-09 NOTE — H&P (View-Only) (Signed)
°  Progress Note  VASCULAR SURGERY ASSESSMENT & PLAN:   RECURRENT LEFT LOWER EXTREMITY DVT: This patient developed a recurrent left lower extremity DVT which appears to involve his left external iliac vein, common femoral vein, femoral vein, and popliteal vein.  He is currently on IV heparin.  He is scheduled for a venogram, IVUS, mechanical thrombectomy, and possible stenting tomorrow in the PV lab.  I have discussed the indications for the procedure and potential complications and he is agreeable to proceed.  His GFR is 85.  Continue gentle hydration.  Gae Gallop, MD 8:21 AM    01/09/2021 7:10 AM Hospital Day 1  Subjective:  sitting up eating.  Says he feels better   afebrile  Vitals:   01/09/21 0324 01/09/21 0338  BP:  (!) 145/76  Pulse:  75  Resp: (!) 22 20  Temp:  98 F (36.7 C)  SpO2: 94% 91%    Physical Exam: General:  no distress Lungs:  non labored Extremities:  left foot warm and well perfused.   CBC    Component Value Date/Time   WBC 6.5 01/09/2021 0547   RBC 4.85 01/09/2021 0547   HGB 13.7 01/09/2021 0547   HCT 42.4 01/09/2021 0547   PLT 143 (L) 01/09/2021 0547   MCV 87.4 01/09/2021 0547   MCH 28.2 01/09/2021 0547   MCHC 32.3 01/09/2021 0547   RDW 14.4 01/09/2021 0547   LYMPHSABS 2.4 01/08/2021 0324   MONOABS 0.6 01/08/2021 0324   EOSABS 0.3 01/08/2021 0324   BASOSABS 0.0 01/08/2021 0324    BMET    Component Value Date/Time   NA 132 (L) 01/08/2021 0324   NA 143 04/11/2020 1029   K 4.0 01/08/2021 0324   CL 96 (L) 01/08/2021 0324   CO2 26 01/08/2021 0324   GLUCOSE 403 (H) 01/08/2021 0324   BUN 23 (H) 01/08/2021 0324   BUN 15 04/11/2020 1029   CREATININE 1.50 (H) 01/08/2021 0324   CALCIUM 8.9 01/08/2021 0324   GFRNONAA 53 (L) 01/08/2021 0324   GFRAA 85 10/12/2019 0904    INR    Component Value Date/Time   INR 1.2 08/30/2019 0258     Intake/Output Summary (Last 24 hours) at 01/09/2021 0710 Last data filed at 01/09/2021  5056 Gross per 24 hour  Intake 1124.47 ml  Output 2100 ml  Net -975.53 ml     Assessment/Plan:  60 y.o. male with extensive LLE DVT Hospital Day 1  -pt states his leg is feeling better.  Plan for mechanical thrombectomy tomorrow.  Needs to elevate legs.  -continue heparin -npo/consent/labs placed  Leontine Locket, PA-C [facility] [phone] 01/09/2021 7:10 AM

## 2021-01-09 NOTE — Significant Event (Signed)
CBG improved to 127 on endotool.  1) stopping endo tool (pt already has 37u lantus on board from earlier in evening) 2) CBG checks Q1H for next 4 hours 3) resume lantus 37u BID with next dose at 10am regularly secheduled time (may need to be further adjusted) 4) resume high dose SSI AC 5) resume carb mod diet

## 2021-01-09 NOTE — Progress Notes (Signed)
ANTICOAGULATION CONSULT NOTE - Follow Up Consult  Pharmacy Consult for Heparin Indication:  new VTE  No Known Allergies  Patient Measurements: Height: 5\' 8"  (172.7 cm) Weight: (!) 175.8 kg (387 lb 9.1 oz) IBW/kg (Calculated) : 68.4 Heparin Dosing Weight: 112.6kg  Vital Signs: Temp: 98 F (36.7 C) (12/22 0338) Temp Source: Oral (12/22 0338) BP: 145/76 (12/22 0338) Pulse Rate: 75 (12/22 0338)  Labs: Recent Labs    01/07/21 1426 01/08/21 0324 01/08/21 1118 01/09/21 0547  HGB 15.4 13.7  --  13.7  HCT 48.2 41.8  --  42.4  PLT 169 131*  --  143*  HEPARINUNFRC  --  0.71* 0.69 0.84*  CREATININE 1.49* 1.50*  --  1.29*    Estimated Creatinine Clearance: 97.2 mL/min (A) (by C-G formula based on SCr of 1.29 mg/dL (H)).   Assessment:  Anticoag: IV heparin for recurrent/acute LLE DVT per duplex 12/20.  IVC stent patent per duplex 12/20 - HL 0.84, CBC WNL, watch platelets 143 low normal    Goal of Therapy:  Heparin level 0.3-0.7 units/ml Monitor platelets by anticoagulation protocol: Yes   Plan:  Decrease IV heparin to 1650 units/hr Recheck HL in 6 hrs.  Lawarence Meek S. Alford Highland, PharmD, BCPS Clinical Staff Pharmacist [email or web address]  Alford Highland, [employer] 01/09/2021,7:35 AM

## 2021-01-09 NOTE — Progress Notes (Signed)
PROGRESS NOTE    DENO SIDA  XTG:[id number] DOB: 1960/02/11 DOA: 01/07/2021 PCP: Vevelyn Francois, NP   Brief Narrative:  60 year old with history of DM2, morbid obesity, chronic hypoxia, severe OSA requiring trach placement, HTN, diastolic CHF, extensive left lower extremity DVT status post mechanical thrombectomy, angioplasty in November 2021 admitted for left lower extremity swelling.  He has been off Eliquis for 6 months, ultrasound back in November 2022 was negative for DVT therefore not restarted but now presents back again with extensive lower extremity DVT.  Vascular team is following the patient.  Plans for OR on Friday.   Assessment & Plan:   Principal Problem:   Acute deep vein thrombosis (DVT) of left lower extremity (HCC) Active Problems:   COPD (chronic obstructive pulmonary disease) (HCC)   Type 2 diabetes mellitus with hyperglycemia, with long-term current use of insulin (HCC)   Tracheostomy dependence (HCC)   Chronic respiratory failure due to obstructive sleep apnea (HCC)   Chronic diastolic CHF (congestive heart failure) (HCC)   Acute DVT of left lower extremity - Cont Heparin Drip.  Patient has been evaluated by vascular team, plans for OR on Friday-mechanical thrombectomy. -Should be on lifelong anticoagulation unless contraindicated  Diabetes mellitus type 2, insulin-dependent. Uncontrolled due to hyperglycemia -A1c-11.0 -Overnight patient was placed on Endo tool due to hyperglycemia which has now been stopped.  We will increase Lantus to 40 units twice daily, sliding scale and Accu-Chek.  COPD with chronic respiratory failure Tracheostomy dependent - As needed bronchodilators.  Supportive care.  Patient is trach dependent, routine trach care. [name] will be exchanged today, has not had one in last > 6 months.   Chronic diastolic congestive heart failure - Continue daily torsemide.  Appears to be euvolemic    DVT prophylaxis: On heparin drip Code  Status: Full code Family Communication:    Status is: Inpatient  Remains inpatient appropriate because: During hospital stay on IV heparin due to extensive recurrent DVT of his legs, ongoing evaluation by vascular surgery.   Subjective: Resting this morning, no acute events overnight.  No complaints.   Examination: Constitutional: Not in acute distress.  Morbidly obese Respiratory: Diminished breath sounds bilaterally Cardiovascular: Normal sinus rhythm, no rubs Abdomen: Nontender nondistended good bowel sounds Musculoskeletal: No edema noted Skin: No rashes seen Neurologic: CN 2-12 grossly intact.  And nonfocal Psychiatric: Normal judgment and insight. Alert and oriented x 3. Normal mood.  Tracheostomy in place   Objective: Vitals:   01/09/21 0200 01/09/21 0300 01/09/21 0324 01/09/21 0338  BP:    (!) 145/76  Pulse:    75  Resp: 20 20 (!) 22 20  Temp:    98 F (36.7 C)  TempSrc:    Oral  SpO2: 94%  94% 91%  Weight:      Height:        Intake/Output Summary (Last 24 hours) at 01/09/2021 0749 Last data filed at 01/09/2021 0655 Gross per 24 hour  Intake 1124.47 ml  Output 2100 ml  Net -975.53 ml   Filed Weights   01/08/21 0240  Weight: (!) 175.8 kg     Data Reviewed:   CBC: Recent Labs  Lab 01/07/21 1426 01/08/21 0324 01/09/21 0547  WBC 8.7 7.7 6.5  NEUTROABS 4.0 4.3  --   HGB 15.4 13.7 13.7  HCT 48.2 41.8 42.4  MCV 88.8 86.2 87.4  PLT 169 131* 270*   Basic Metabolic Panel: Recent Labs  Lab 01/07/21 1426 01/08/21 0324 01/09/21 0547  NA  132* 132* 131*  K 4.5 4.0 3.6  CL 93* 96* 96*  CO2 28 26 27   GLUCOSE 509* 403* 210*  BUN 25* 23* 19  CREATININE 1.49* 1.50* 1.29*  CALCIUM 9.1 8.9 8.4*  MG  --  1.9 2.0   GFR: Estimated Creatinine Clearance: 97.2 mL/min (A) (by C-G formula based on SCr of 1.29 mg/dL (H)). Liver Function Tests: Recent Labs  Lab 01/08/21 0324  AST 21  ALT 20  ALKPHOS 145*  BILITOT 0.9  PROT 7.2  ALBUMIN 3.3*    No results for input(s): LIPASE, AMYLASE in the last 168 hours. No results for input(s): AMMONIA in the last 168 hours. Coagulation Profile: No results for input(s): INR, PROTIME in the last 168 hours. Cardiac Enzymes: No results for input(s): CKTOTAL, CKMB, CKMBINDEX, TROPONINI in the last 168 hours. BNP (last 3 results) No results for input(s): PROBNP in the last 8760 hours. HbA1C: Recent Labs    01/08/21 0324  HGBA1C 11.0*   CBG: Recent Labs  Lab 01/09/21 0211 01/09/21 0312 01/09/21 0414 01/09/21 0517 01/09/21 0612  GLUCAP 140* 127* 162* 190* 234*   Lipid Profile: No results for input(s): CHOL, HDL, LDLCALC, TRIG, CHOLHDL, LDLDIRECT in the last 72 hours. Thyroid Function Tests: No results for input(s): TSH, T4TOTAL, FREET4, T3FREE, THYROIDAB in the last 72 hours. Anemia Panel: No results for input(s): VITAMINB12, FOLATE, FERRITIN, TIBC, IRON, RETICCTPCT in the last 72 hours. Sepsis Labs: No results for input(s): PROCALCITON, LATICACIDVEN in the last 168 hours.  Recent Results (from the past 240 hour(s))  Resp Panel by RT-PCR (Flu A&B, Covid) Nasopharyngeal Swab     Status: None   Collection Time: 01/07/21  5:02 PM   Specimen: Nasopharyngeal Swab; Nasopharyngeal(NP) swabs in vial transport medium  Result Value Ref Range Status   SARS Coronavirus 2 by RT PCR NEGATIVE NEGATIVE Final    Comment: (NOTE) SARS-CoV-2 target nucleic acids are NOT DETECTED.  The SARS-CoV-2 RNA is generally detectable in upper respiratory specimens during the acute phase of infection. The lowest concentration of SARS-CoV-2 viral copies this assay can detect is 138 copies/mL. A negative result does not preclude SARS-Cov-2 infection and should not be used as the sole basis for treatment or other patient management decisions. A negative result may occur with  improper specimen collection/handling, submission of specimen other than nasopharyngeal swab, presence of viral mutation(s) within  the areas targeted by this assay, and inadequate number of viral copies(<138 copies/mL). A negative result must be combined with clinical observations, patient history, and epidemiological information. The expected result is Negative.  Fact Sheet for Patients:  EntrepreneurPulse.com.au  Fact Sheet for Healthcare Providers:  IncredibleEmployment.be  This test is no t yet approved or cleared by the [place] FDA and  has been authorized for detection and/or diagnosis of SARS-CoV-2 by FDA under an Emergency Use Authorization (EUA). This EUA will remain  in effect (meaning this test can be used) for the duration of the COVID-19 declaration under Section 564(b)(1) of the Act, 21 U.S.C.section 360bbb-3(b)(1), unless the authorization is terminated  or revoked sooner.       Influenza A by PCR NEGATIVE NEGATIVE Final   Influenza B by PCR NEGATIVE NEGATIVE Final    Comment: (NOTE) The Xpert Xpress SARS-CoV-2/FLU/RSV plus assay is intended as an aid in the diagnosis of influenza from Nasopharyngeal swab specimens and should not be used as a sole basis for treatment. Nasal washings and aspirates are unacceptable for Xpert Xpress SARS-CoV-2/FLU/RSV testing.  Fact Sheet for  Patients: EntrepreneurPulse.com.au  Fact Sheet for Healthcare Providers: IncredibleEmployment.be  This test is not yet approved or cleared by the [place] FDA and has been authorized for detection and/or diagnosis of SARS-CoV-2 by FDA under an Emergency Use Authorization (EUA). This EUA will remain in effect (meaning this test can be used) for the duration of the COVID-19 declaration under Section 564(b)(1) of the Act, 21 U.S.C. section 360bbb-3(b)(1), unless the authorization is terminated or revoked.  Performed at [facility], [place] [address], [place], [place] [address]          Radiology Studies: CT Angio Chest PE W  and/or Wo Contrast  Result Date: 01/07/2021 CLINICAL DATA:  Concern for pulmonary embolism. Concern for deep venous thrombosis in the iliac veins. EXAM: CT ANGIOGRAPHY CHEST CT venogram ABDOMEN AND PELVIS WITH CONTRAST TECHNIQUE: Multidetector CT imaging of the chest was performed using the standard protocol during bolus administration of intravenous contrast. Multiplanar CT image reconstructions and MIPs were obtained to evaluate the vascular anatomy. Multidetector CT imaging of the abdomen and pelvis was performed using the delayed standard protocol during bolus administration of intravenous contrast. CONTRAST:  185mL OMNIPAQUE IOHEXOL 350 MG/ML SOLN COMPARISON:  None. FINDINGS: CTA CHEST FINDINGS Cardiovascular: No filling defects within the pulmonary arteries to suggest acute pulmonary embolism. No significant vascular findings. Normal heart size. No pericardial effusion. Mediastinum/Nodes: No axillary or supraclavicular adenopathy. No mediastinal or hilar adenopathy. No pericardial fluid. Esophagus normal. Lungs/Pleura: No pulmonary infarction. Mild atelectasis LEFT lower lobe. No pneumonia. Musculoskeletal: Degenerative osteophytosis of the spine. Review of the MIP images confirms the above findings. CT ABDOMEN and PELVIS FINDINGS Hepatobiliary: Liver is normal.  Small sludge in the gallbladder. Pancreas: Pancreas is normal. No ductal dilatation. No pancreatic inflammation. Spleen: Normal spleen Adrenals/urinary tract: Adrenal glands and kidneys are normal. The ureters and bladder normal. Stomach/Bowel: Stomach, small bowel, appendix, and cecum are normal. The colon and rectosigmoid colon are normal. Vascular/Lymphatic: Abdominal aorta normal. Vascular stent within the LEFT common iliac vein. No evidence of thrombosis within the iliac veins although the bolus timing does not well opacified the veins. Reproductive: Unremarkable Other: No free fluid. Musculoskeletal: No aggressive osseous lesion. Review of  the MIP images confirms the above findings. IMPRESSION: Chest Impression: 1. No evidence acute pulmonary embolism. 2. No acute pulmonary parenchymal findings. Abdomen / Pelvis Impression: 1. No evidence of venous thrombosis in iliac veins although they are not well opacified. 2. LEFT common iliac vein stent is expanded. Electronically Signed   By: Suzy Bouchard M.D.   On: 01/07/2021 21:43   VAS [place] IVC/ILIAC (VENOUS ONLY)  Result Date: 01/07/2021 IVC/ILIAC STUDY Patient Name:  Timothy Le  Date of Exam:   01/07/2021 Medical Rec #: [id number]         Accession #:    [id number] Date of Birth: November 29, 1960         Patient Gender: M Patient Age:   35 years Exam Location:  [facility] Procedure:      VAS [place] IVC/ILIAC (VENOUS ONLY) Referring Phys: Carlisle Cater --------------------------------------------------------------------------------  Indications: Left lower extremity DVT Other Factors: History of DVT. Limitations: Air/bowel gas and obesity.  Comparison Study: 12/07/2019 IVC/iliac vein duplex Performing Technologist: Maudry Mayhew MHA, RDMS, RVT, RDCS  Examination Guidelines: A complete evaluation includes B-mode imaging, spectral Doppler, color Doppler, and power Doppler as needed of all accessible portions of each vessel. Bilateral testing is considered an integral part of a complete examination. Limited examinations for reoccurring indications may be  performed as noted.  IVC/Iliac Findings: +----------+------+--------+--------+     IVC     Patent Thrombus Comments  +----------+------+--------+--------+  IVC Prox   patent                    +----------+------+--------+--------+  IVC Mid    patent                    +----------+------+--------+--------+  IVC Distal patent                    +----------+------+--------+--------+  Unable to visualize bilateral CIVs +-------------------------+---------+-----------+---------+-----------+--------+             EIV             RT-Patent RT-Thrombus LT-Patent LT-Thrombus Comments  +-------------------------+---------+-----------+---------+-----------+--------+  External Iliac Vein                                          acute               Distal                                                                          +-------------------------+---------+-----------+---------+-----------+--------+   Summary: IVC/Iliac: There is no evidence of thrombus involving the IVC. There is no evidence of thrombus involving the right external iliac vein. There is evidence of acute thrombus involving the left external iliac vein. Unable to visualzie bilatearl common iliac veins.  *See table(s) above for measurements and observations.  Electronically signed by Deitra Mayo MD on 01/07/2021 at 65:14:18 PM.    Final    CT VENOGRAM ABD/PEL  Result Date: 01/07/2021 CLINICAL DATA:  Concern for pulmonary embolism. Concern for deep venous thrombosis in the iliac veins. EXAM: CT ANGIOGRAPHY CHEST CT venogram ABDOMEN AND PELVIS WITH CONTRAST TECHNIQUE: Multidetector CT imaging of the chest was performed using the standard protocol during bolus administration of intravenous contrast. Multiplanar CT image reconstructions and MIPs were obtained to evaluate the vascular anatomy. Multidetector CT imaging of the abdomen and pelvis was performed using the delayed standard protocol during bolus administration of intravenous contrast. CONTRAST:  159mL OMNIPAQUE IOHEXOL 350 MG/ML SOLN COMPARISON:  None. FINDINGS: CTA CHEST FINDINGS Cardiovascular: No filling defects within the pulmonary arteries to suggest acute pulmonary embolism. No significant vascular findings. Normal heart size. No pericardial effusion. Mediastinum/Nodes: No axillary or supraclavicular adenopathy. No mediastinal or hilar adenopathy. No pericardial fluid. Esophagus normal. Lungs/Pleura: No pulmonary infarction. Mild atelectasis LEFT lower lobe. No pneumonia. Musculoskeletal: Degenerative  osteophytosis of the spine. Review of the MIP images confirms the above findings. CT ABDOMEN and PELVIS FINDINGS Hepatobiliary: Liver is normal.  Small sludge in the gallbladder. Pancreas: Pancreas is normal. No ductal dilatation. No pancreatic inflammation. Spleen: Normal spleen Adrenals/urinary tract: Adrenal glands and kidneys are normal. The ureters and bladder normal. Stomach/Bowel: Stomach, small bowel, appendix, and cecum are normal. The colon and rectosigmoid colon are normal. Vascular/Lymphatic: Abdominal aorta normal. Vascular stent within the LEFT common iliac vein. No evidence of thrombosis within the iliac veins although the bolus timing does not well opacified the veins. Reproductive: Unremarkable  Other: No free fluid. Musculoskeletal: No aggressive osseous lesion. Review of the MIP images confirms the above findings. IMPRESSION: Chest Impression: 1. No evidence acute pulmonary embolism. 2. No acute pulmonary parenchymal findings. Abdomen / Pelvis Impression: 1. No evidence of venous thrombosis in iliac veins although they are not well opacified. 2. LEFT common iliac vein stent is expanded. Electronically Signed   By: Suzy Bouchard M.D.   On: 01/07/2021 21:43   VAS [place] LOWER EXTREMITY VENOUS (DVT) (ONLY [facility] & [facility])  Result Date: 01/07/2021  Lower Venous DVT Study Patient Name:  KAMAURI DENARDO  Date of Exam:   01/07/2021 Medical Rec #: [id number]         Accession #:    [id number] Date of Birth: 1960-04-14         Patient Gender: M Patient Age:   45 years Exam Location:  [facility] Procedure:      VAS [place] LOWER EXTREMITY VENOUS (DVT) Referring Phys: Vonna Kotyk GEIPLE --------------------------------------------------------------------------------  Indications: Edema, and Pain.  Comparison       12/07/2019- age indeterminate DVT throughout left lower Study:           extremity Performing Technologist: Maudry Mayhew MHA, RDMS, RVT, RDCS  Examination Guidelines: A complete evaluation includes  B-mode imaging, spectral Doppler, color Doppler, and power Doppler as needed of all accessible portions of each vessel. Bilateral testing is considered an integral part of a complete examination. Limited examinations for reoccurring indications may be performed as noted. The reflux portion of the exam is performed with the patient in reverse Trendelenburg.  +-----+---------------+---------+-----------+----------+--------------+  RIGHT Compressibility Phasicity Spontaneity Properties Thrombus Aging  +-----+---------------+---------+-----------+----------+--------------+  CFV   Full            Yes       Yes                                    +-----+---------------+---------+-----------+----------+--------------+   +---------+---------------+---------+-----------+----------+--------------+  LEFT      Compressibility Phasicity Spontaneity Properties Thrombus Aging  +---------+---------------+---------+-----------+----------+--------------+  CFV       None                      No                     Acute           +---------+---------------+---------+-----------+----------+--------------+  SFJ       None                                             Acute           +---------+---------------+---------+-----------+----------+--------------+  FV Prox   None                      No                     Acute           +---------+---------------+---------+-----------+----------+--------------+  FV Mid                              No                                     +---------+---------------+---------+-----------+----------+--------------+  FV Distal                           No                                     +---------+---------------+---------+-----------+----------+--------------+  PFV       None                      No                     Acute           +---------+---------------+---------+-----------+----------+--------------+  POP       None                      No                     Acute            +---------+---------------+---------+-----------+----------+--------------+   Left Technical Findings: Not visualized segments include PTV, peroneal veins.   Summary: LEFT: - Findings consistent with acute deep vein thrombosis involving the left common femoral vein, SF junction, left femoral vein, left proximal profunda vein, and left popliteal vein. - No cystic structure found in the popliteal fossa.  *See table(s) above for measurements and observations. Electronically signed by Deitra Mayo MD on 01/07/2021 at 6:11:52 PM.    Final         Scheduled Meds:  allopurinol  100 mg Oral Daily   atorvastatin  10 mg Oral Daily   budesonide (PULMICORT) nebulizer solution  0.25 mg Nebulization BID   chlorhexidine  15 mL Mouth Rinse BID   gabapentin  200 mg Oral TID   insulin aspart  0-20 Units Subcutaneous TID WC   insulin glargine-yfgn  37 Units Subcutaneous BID   mouth rinse  15 mL Mouth Rinse q12n4p   metoprolol tartrate  12.5 mg Oral BID   pantoprazole  40 mg Oral QHS   potassium chloride  40 mEq Oral Once   QUEtiapine  100 mg Oral BID   sertraline  100 mg Oral Daily   torsemide  10 mg Oral Daily   Continuous Infusions:  heparin 1,850 Units/hr (01/09/21 0655)     LOS: 2 days   Time spent= 35 mins    Brylan Dec Arsenio Loader, MD [facility]  If 7PM-7AM, please contact night-coverage  01/09/2021, 7:49 AM

## 2021-01-09 NOTE — Progress Notes (Signed)
Mobility Specialist Progress Note    01/09/21 1708  Mobility  Activity Ambulated in hall  Level of Assistance Modified independent, requires aide device or extra time  Assistive Device [facility] Ambulated (ft) 125 ft  Mobility Ambulated independently in hallway  Mobility Response Tolerated fair  Mobility performed by Mobility specialist  Bed Position Chair  $Mobility charge 1 Mobility   Pt received in bed and agreeable. Returned to sit in front of sink to wash up with RN present.   [facility] Mobility Specialist  M.S. Primary Phone: [phone] M.S. Secondary Phone: [phone]

## 2021-01-09 NOTE — Progress Notes (Signed)
Inpatient Diabetes Program Recommendations  AACE/ADA: New Consensus Statement on Inpatient Glycemic Control (2015)  Target Ranges:  Prepandial:   less than 140 mg/dL      Peak postprandial:   less than 180 mg/dL (1-2 hours)      Critically ill patients:  140 - 180 mg/dL   Lab Results  Component Value Date   GLUCAP 321 (H) 01/09/2021   HGBA1C 11.0 (H) 01/08/2021    Review of Glycemic Control  Latest Reference Range & Units 01/09/21 05:17 01/09/21 06:12 01/09/21 12:22  Glucose-Capillary 70 - 99 mg/dL 190 (H) 234 (H) 321 (H)  (H): Data is abnormally high Diabetes history: Type 2 DM Outpatient Diabetes medications: Lantus 32 units BID Current orders for Inpatient glycemic control: Semglee 40 units BID, Novolog 0-20 units TID  Inpatient Diabetes Program Recommendations:    Consider adding Novolog 6 units TID (assuming patient consuming >50% of meals).   Thanks, Bronson Curb, MSN, RNC-OB Diabetes Coordinator [phone] (8a-5p)

## 2021-01-10 ENCOUNTER — Ambulatory Visit (HOSPITAL_COMMUNITY): Admission: RE | Admit: 2021-01-10 | Payer: Medicaid Other | Source: Home / Self Care | Admitting: Vascular Surgery

## 2021-01-10 ENCOUNTER — Encounter (HOSPITAL_COMMUNITY): Payer: Self-pay | Admitting: Vascular Surgery

## 2021-01-10 ENCOUNTER — Encounter (HOSPITAL_COMMUNITY)
Admission: EM | Disposition: A | Discharge status: Home / Self Care | Payer: Self-pay | Source: Home / Self Care | Attending: Internal Medicine

## 2021-01-10 DIAGNOSIS — I82422 Acute embolism and thrombosis of left iliac vein: Secondary | ICD-10-CM

## 2021-01-10 DIAGNOSIS — I82432 Acute embolism and thrombosis of left popliteal vein: Secondary | ICD-10-CM

## 2021-01-10 DIAGNOSIS — I5032 Chronic diastolic (congestive) heart failure: Secondary | ICD-10-CM | POA: Diagnosis not present

## 2021-01-10 DIAGNOSIS — J9610 Chronic respiratory failure, unspecified whether with hypoxia or hypercapnia: Secondary | ICD-10-CM | POA: Diagnosis not present

## 2021-01-10 DIAGNOSIS — I824Y2 Acute embolism and thrombosis of unspecified deep veins of left proximal lower extremity: Secondary | ICD-10-CM | POA: Diagnosis not present

## 2021-01-10 DIAGNOSIS — J439 Emphysema, unspecified: Secondary | ICD-10-CM | POA: Diagnosis not present

## 2021-01-10 DIAGNOSIS — I82412 Acute embolism and thrombosis of left femoral vein: Secondary | ICD-10-CM

## 2021-01-10 HISTORY — PX: IVC VENOGRAPHY: CATH118301

## 2021-01-10 HISTORY — PX: LOWER EXTREMITY VENOGRAPHY: CATH118253

## 2021-01-10 LAB — CBC
HCT: 41.6 % (ref 39.0–52.0)
Hemoglobin: 13.2 g/dL (ref 13.0–17.0)
MCH: 28.1 pg (ref 26.0–34.0)
MCHC: 31.7 g/dL (ref 30.0–36.0)
MCV: 88.7 fL (ref 80.0–100.0)
Platelets: 128 10*3/uL — ABNORMAL LOW (ref 150–400)
RBC: 4.69 MIL/uL (ref 4.22–5.81)
RDW: 14.5 % (ref 11.5–15.5)
WBC: 5.7 10*3/uL (ref 4.0–10.5)
nRBC: 0 % (ref 0.0–0.2)

## 2021-01-10 LAB — BASIC METABOLIC PANEL
Anion gap: 6 (ref 5–15)
BUN: 21 mg/dL — ABNORMAL HIGH (ref 6–20)
CO2: 26 mmol/L (ref 22–32)
Calcium: 8.5 mg/dL — ABNORMAL LOW (ref 8.9–10.3)
Chloride: 100 mmol/L (ref 98–111)
Creatinine, Ser: 1.54 mg/dL — ABNORMAL HIGH (ref 0.61–1.24)
GFR, Estimated: 52 mL/min — ABNORMAL LOW (ref >60)
Glucose, Bld: 259 mg/dL — ABNORMAL HIGH (ref 70–99)
Potassium: 4.1 mmol/L (ref 3.5–5.1)
Sodium: 132 mmol/L — ABNORMAL LOW (ref 135–145)

## 2021-01-10 LAB — GLUCOSE, CAPILLARY
Glucose-Capillary: 260 mg/dL — ABNORMAL HIGH (ref 70–99)
Glucose-Capillary: 225 mg/dL — ABNORMAL HIGH (ref 70–99)
Glucose-Capillary: 194 mg/dL — ABNORMAL HIGH (ref 70–99)
Glucose-Capillary: 292 mg/dL — ABNORMAL HIGH (ref 70–99)

## 2021-01-10 LAB — SURGICAL PCR SCREEN
MRSA, PCR: NEGATIVE
Staphylococcus aureus: POSITIVE — AB

## 2021-01-10 LAB — CARDIOLIPIN ANTIBODIES, IGG, IGM, IGA
Anticardiolipin IgA: <9 APL U/mL (ref 0–11)
Anticardiolipin IgG: <9 GPL U/mL (ref 0–14)
Anticardiolipin IgM: <9 MPL U/mL (ref 0–12)

## 2021-01-10 LAB — MAGNESIUM: Magnesium: 1.9 mg/dL (ref 1.7–2.4)

## 2021-01-10 LAB — HEPARIN LEVEL (UNFRACTIONATED): Heparin Unfractionated: 0.56 IU/mL (ref 0.30–0.70)

## 2021-01-10 LAB — PROTEIN C, TOTAL: Protein C, Total: 125 % (ref 60–150)

## 2021-01-10 SURGERY — LOWER EXTREMITY VENOGRAPHY
Anesthesia: LOCAL

## 2021-01-10 MED ORDER — MIDAZOLAM HCL 2 MG/2ML IJ SOLN
INTRAMUSCULAR | Status: AC
  Filled 2021-01-10: qty 2

## 2021-01-10 MED ORDER — HEPARIN (PORCINE) IN NACL 1000-0.9 UT/500ML-% IV SOLN
INTRAVENOUS | Status: AC
  Filled 2021-01-10: qty 500

## 2021-01-10 MED ORDER — MORPHINE SULFATE (PF) 2 MG/ML IV SOLN: 2.0000 mg | INTRAVENOUS | Status: DC | PRN

## 2021-01-10 MED ORDER — OXYCODONE HCL 5 MG PO TABS: 5.0000 mg | ORAL_TABLET | ORAL | Status: DC | PRN

## 2021-01-10 MED ORDER — HEPARIN (PORCINE) IN NACL 1000-0.9 UT/500ML-% IV SOLN
INTRAVENOUS | Status: DC | PRN
  Administered 2021-01-10 (×2): 500 mL

## 2021-01-10 MED ORDER — HEPARIN (PORCINE) 25000 UT/250ML-% IV SOLN
1700.0000 [IU]/h | INTRAVENOUS | Status: DC
  Administered 2021-01-10 – 2021-01-11 (×2): 1650 [IU]/h via INTRAVENOUS
  Filled 2021-01-10 (×3): qty 250

## 2021-01-10 MED ORDER — MENTHOL 3 MG MT LOZG
1.0000 | LOZENGE | OROMUCOSAL | Status: DC | PRN
  Filled 2021-01-10: qty 9

## 2021-01-10 MED ORDER — SODIUM CHLORIDE 0.9% FLUSH
3.0000 mL | Freq: Two times a day (BID) | INTRAVENOUS | Status: DC
  Administered 2021-01-10: 21:00:00 3 mL via INTRAVENOUS

## 2021-01-10 MED ORDER — LABETALOL HCL 5 MG/ML IV SOLN: 10.0000 mg | INTRAVENOUS | Status: DC | PRN

## 2021-01-10 MED ORDER — FENTANYL CITRATE (PF) 100 MCG/2ML IJ SOLN
INTRAMUSCULAR | Status: DC | PRN
  Administered 2021-01-10 (×2): 50 ug via INTRAVENOUS

## 2021-01-10 MED ORDER — LIDOCAINE HCL (PF) 1 % IJ SOLN
INTRAMUSCULAR | Status: DC | PRN
  Administered 2021-01-10: 15 mL via INTRADERMAL

## 2021-01-10 MED ORDER — MIDAZOLAM HCL 2 MG/2ML IJ SOLN
INTRAMUSCULAR | Status: DC | PRN
  Administered 2021-01-10 (×2): 1 mg via INTRAVENOUS

## 2021-01-10 MED ORDER — INSULIN GLARGINE-YFGN 100 UNIT/ML ~~LOC~~ SOLN
45.0000 [IU] | Freq: Two times a day (BID) | SUBCUTANEOUS | Status: DC
  Administered 2021-01-10 (×2): 45 [IU] via SUBCUTANEOUS
  Filled 2021-01-10 (×4): qty 0.45

## 2021-01-10 MED ORDER — SODIUM CHLORIDE 0.9 % IV SOLN: INTRAVENOUS | Status: AC

## 2021-01-10 MED ORDER — FENTANYL CITRATE (PF) 100 MCG/2ML IJ SOLN
INTRAMUSCULAR | Status: AC
  Filled 2021-01-10: qty 2

## 2021-01-10 MED ORDER — HEPARIN SODIUM (PORCINE) 1000 UNIT/ML IJ SOLN
INTRAMUSCULAR | Status: AC
  Filled 2021-01-10: qty 10

## 2021-01-10 MED ORDER — LIDOCAINE HCL (PF) 1 % IJ SOLN
INTRAMUSCULAR | Status: AC
  Filled 2021-01-10: qty 30

## 2021-01-10 MED ORDER — ASPIRIN EC 81 MG PO TBEC
81.0000 mg | DELAYED_RELEASE_TABLET | Freq: Every day | ORAL | Status: DC
  Administered 2021-01-10 – 2021-01-11 (×2): 81 mg via ORAL
  Filled 2021-01-10 (×2): qty 1

## 2021-01-10 MED ORDER — HEPARIN SODIUM (PORCINE) 1000 UNIT/ML IJ SOLN
INTRAMUSCULAR | Status: DC | PRN
  Administered 2021-01-10: 10000 [IU] via INTRAVENOUS

## 2021-01-10 MED ORDER — ACETAMINOPHEN 325 MG PO TABS: 650.0000 mg | ORAL_TABLET | ORAL | Status: DC | PRN

## 2021-01-10 MED ORDER — SODIUM CHLORIDE 0.9% FLUSH: 3.0000 mL | INTRAVENOUS | Status: DC | PRN

## 2021-01-10 MED ORDER — ONDANSETRON HCL 4 MG/2ML IJ SOLN: 4.0000 mg | Freq: Four times a day (QID) | INTRAMUSCULAR | Status: DC | PRN

## 2021-01-10 MED ORDER — SODIUM CHLORIDE 0.9 % IV SOLN: INTRAVENOUS | Status: DC

## 2021-01-10 MED ORDER — IODIXANOL 320 MG/ML IV SOLN
INTRAVENOUS | Status: DC | PRN
  Administered 2021-01-10: 16:00:00 50 mL via INTRAVENOUS

## 2021-01-10 MED ORDER — SODIUM CHLORIDE 0.9 % IV SOLN: 250.0000 mL | INTRAVENOUS | Status: DC | PRN

## 2021-01-10 MED ORDER — HYDRALAZINE HCL 20 MG/ML IJ SOLN: 5.0000 mg | INTRAMUSCULAR | Status: DC | PRN

## 2021-01-10 SURGICAL SUPPLY — 20 items
BAG SNAP BAND KOVER 36X36 (MISCELLANEOUS) ×2 IMPLANT
BALLN MUSTANG 12X60X135 (BALLOONS) ×3
BALLOON MUSTANG 12X60X135 (BALLOONS) IMPLANT
CATH INFINITI VERT 5FR 125CM (CATHETERS) ×2 IMPLANT
CATH RETRIEVER CLOT 16MMX105CM (CATHETERS) ×2 IMPLANT
CATH VISIONS PV .035 IVUS (CATHETERS) ×2 IMPLANT
COVER DOME SNAP 22 D (MISCELLANEOUS) ×2 IMPLANT
GLIDEWIRE ADV .035X260CM (WIRE) ×2 IMPLANT
GLIDEWIRE NITREX 0.018X80X5 (WIRE) ×3
GUIDEWIRE NITREX 0.018X80X5 (WIRE) IMPLANT
KIT ENCORE 26 ADVANTAGE (KITS) ×2 IMPLANT
PROTECTION STATION PRESSURIZED (MISCELLANEOUS) ×3
SHEATH CLOT RETRIEVER (SHEATH) ×2 IMPLANT
SHEATH PINNACLE 8F 10CM (SHEATH) ×2 IMPLANT
SHEATH PROBE COVER 6X72 (BAG) ×2 IMPLANT
SHIELD RADPAD SCOOP 12X17 (MISCELLANEOUS) ×2 IMPLANT
STATION PROTECTION PRESSURIZED (MISCELLANEOUS) IMPLANT
STENT VENOUS ABRE 16X60 (Permanent Stent) ×2 IMPLANT
TRAY PV CATH (CUSTOM PROCEDURE TRAY) ×3 IMPLANT
WIRE MICRO SET SILHO 5FR 7 (SHEATH) ×4 IMPLANT

## 2021-01-10 NOTE — Op Note (Signed)
PATIENT: Timothy Le      MRN: [id number] DOB: 1960/11/29    DATE OF PROCEDURE: 01/10/2021  INDICATIONS:    Timothy Le is a 60 y.o. male who presented with a recurrent left LE DVT.  He had previously undergone mechanical thrombectomy and angioplasty and stenting of the left common and external iliac veins with a 50mm Wallstent.  He had been off anticoagulation and presented with a swollen left leg and was found to have a new clot in the left leg involving the external iliac vein, common femoral vein, and femoral vein.  He presents for mechanical thrombectomy.  PROCEDURE:    US guided access to left popliteal vein IVUS IVC, Left CIV, EIV, CFV, FV Mechanical thrombectomy of IVC, Left CIV, EIV, CFV, FV Angioplasty and stenting of Left EIV, CFV Venogram IVC, left CIV, EIV, CFV, FV  SURGEON: Arturo Freundlich S. Scot Dock, MD,   ASSIST: Servando Snare, MD  ANESTHESIA: Local with sedation  EBL: 50 cc  TECHNIQUE: The patient was brought to the peripheral vascular lab and was sedated. He was placed prone.The period of conscious sedation was 71 minutes.  During that time period, I was present face-to-face 100% of the time.  The patient was administered 1 mg versed, 50 mcg Fentanyl. The patient's heart rate, blood pressure, and oxygen saturation were monitored by the nurse continuously during the procedure.  Both popliteal fossas were prepped and draped.  Under ultrasound guidance, after the skin was anesthetized, the left popliteal vein was cannulated with a micropuncture needle and a micropuncture sheath introduced over a wire.  A Glidewire advantage was then advanced into the femoral vein and the micropuncture sheath exchanged for an 8 French sheath.  A venogram was obtained to confirm we were in the vein.  Next IVUS was performed from the femoral vein to include the common femoral vein, external iliac vein, common iliac vein, and IVC.  There was clot present in the IVC above the stent.  The  stent itself was patent with some chronic clot at the distal aspect of the stent in the external iliac vein.  Mechanical thrombectomy was then performed using the  Inari mechanical thrombectomy device.  The 8 French sheath was exchanged for the 54 French sheath in the left popliteal space a large amount of chronic and acute clot was retrieved from the IVC, external iliac vein, and common femoral vein.  5 passes were made.  IVUS was repeated and there was excellent improvement.  There was an area of narrowing noted at the distal aspect of the previous Wallstent and this was addressed with angioplasty and stenting.  We selected a Abre 16 mm by 60 mm stent this was deployed based on IVUS.  Postdilatation was done with a Mustang balloon (12 mm x 60 mm.  Follow-up venogram was obtained to evaluate the femoral vein, common femoral vein, external iliac vein and the stent.  These were all widely patent.  At the completion a bolster suture was placed around the cannulation site after the sheath was removed.   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 Successful mechanical thrombectomy of the IVC, left common iliac vein, left external iliac vein, left common femoral vein, and left femoral vein. Angioplasty and stenting of the left external iliac vein and common femoral vein with a 16 mm x 60 mm Abre stent.    Deitra Mayo, MD, FACS [facility]  DATE OF DICTATION:   01/10/2021

## 2021-01-10 NOTE — Discharge Instructions (Addendum)
Information on my medicine - ELIQUIS (apixaban)  This medication education was reviewed with me or my healthcare representative as part of my discharge preparation.      Why was Eliquis prescribed for you? Eliquis was prescribed to treat blood clots that may have been found in the veins of your legs (deep vein thrombosis) or in your lungs (pulmonary embolism) and to reduce the risk of them occurring again.  What do You need to know about Eliquis ? The starting dose is 10 mg (two 5 mg tablets) taken TWICE daily for the FIRST SEVEN (7) DAYS, then on 01/11/21 the dose is reduced to ONE 5 mg tablet taken TWICE daily.  Eliquis may be taken with or without food.   Try to take the dose about the same time in the morning and in the evening. If you have difficulty swallowing the tablet whole please discuss with your pharmacist how to take the medication safely.  Take Eliquis exactly as prescribed and DO NOT stop taking Eliquis without talking to the doctor who prescribed the medication.  Stopping may increase your risk of developing a new blood clot.  Refill your prescription before you run out.  After discharge, you should have regular check-up appointments with your healthcare provider that is prescribing your Eliquis.    What do you do if you miss a dose? If a dose of ELIQUIS is not taken at the scheduled time, take it as soon as possible on the same day and twice-daily administration should be resumed. The dose should not be doubled to make up for a missed dose.  Important Safety Information A possible side effect of Eliquis is bleeding. You should call your healthcare provider right away if you experience any of the following: Bleeding from an injury or your nose that does not stop. Unusual colored urine (red or dark brown) or unusual colored stools (red or black). Unusual bruising for unknown reasons. A serious fall or if you hit your head (even if there is no bleeding).  Some  medicines may interact with Eliquis and might increase your risk of bleeding or clotting while on Eliquis. To help avoid this, consult your healthcare provider or pharmacist prior to using any new prescription or non-prescription medications, including herbals, vitamins, non-steroidal anti-inflammatory drugs (NSAIDs) and supplements.  This website has more information on Eliquis (apixaban): http://www.eliquis.com/eliquis/home   Discharge Instructions for DVT (Deep Vein Thrombosis): It is critically important that the legs be elevated daily and effectively. Lay with your back FLAT. If you do not tolerate lying flat, then lay with your head elevated slightly. Next, the [place]. The ANKLES SHOULD BE ELEVATED HIGHER THAN THE KNEES. In order to accomplish this: place the back flat, elevate the legs, place additional pillows under the lower legs in order to get the feet higher than the knees  Also very important to wear your compression stockings or dressing daily. Refrain from prolonged sitting or standing. Take your blood thinner daily as prescribed  Call 911 or visit the nearest emergency room if you:  Sudden chest tightness or pain. Sharp pain when taking a deep breath. Cough with bloody mucus. Sudden shortness of breath or fast breathing. A fast heart rate. Cold skin, clammy skin, or sweating. New swelling in the arms or legs.   You will have follow up with your Vascular provider in 1 month with an ultrasound

## 2021-01-10 NOTE — Progress Notes (Signed)
Pt arrived back to 4E from cath lab. VSS. Diet changed and pt ordered food. Call light in reach.  Raelyn Number, RN

## 2021-01-10 NOTE — Progress Notes (Addendum)
PROGRESS NOTE    Timothy Le  QVZ:[id number] DOB: 1960-09-18 DOA: 01/07/2021 PCP: Vevelyn Francois, NP   Brief Narrative:  60 year old with history of DM2, morbid obesity, chronic hypoxia, severe OSA requiring trach placement, HTN, diastolic CHF, extensive left lower extremity DVT status post mechanical thrombectomy, angioplasty in November 2021 admitted for left lower extremity swelling.  He has been off Eliquis for 6 months, ultrasound back in November 2022 was negative for DVT therefore not restarted but now presents back again with extensive lower extremity DVT.  Vascular team is following the patient.  Plans For OR on Friday   Assessment & Plan:   Principal Problem:   Acute deep vein thrombosis (DVT) of left lower extremity (HCC) Active Problems:   COPD (chronic obstructive pulmonary disease) (HCC)   Type 2 diabetes mellitus with hyperglycemia, with long-term current use of insulin (HCC)   Tracheostomy dependence (HCC)   Chronic respiratory failure due to obstructive sleep apnea (HCC)   Chronic diastolic CHF (congestive heart failure) (HCC)   Acute DVT of left lower extremity - Cont Heparin Drip.  Patient has been evaluated by vascular team, plans for OR today mechanical thrombectomy. -Should be on lifelong anticoagulation unless contraindicated  Diabetes mellitus type 2, insulin-dependent. 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  We will increase Lantus to 45 units twice daily, sliding scale and Accu-Chek. -Gabapentin 200 mg 3 times daily  COPD with chronic respiratory failure Tracheostomy dependent - As needed bronchodilators.  Supportive care.  Patient is trach dependent, routine trach care. Trach changed by resp.   Chronic diastolic congestive heart failure - Continue daily torsemide.  Appears to be euvolemic -Metoprolol 12.5 mg  twice daily  IV fluids started by vascular perioperatively, can be stopped afterwards if he is tolerating orals.  DVT prophylaxis: On heparin drip Code Status: Full code Family Communication:    Status is: Inpatient  Remains inpatient appropriate because: Plans for the OR for mechanical thrombectomy per vascular today.  Further plan still to be determined   Subjective: Sore throat, but no other complaints.   Examination: Constitutional: Not in acute distress Respiratory: Clear to auscultation bilaterally Cardiovascular: Normal sinus rhythm, no rubs Abdomen: Nontender nondistended good bowel sounds Musculoskeletal: No edema noted Skin: No rashes seen Neurologic: CN 2-12 grossly intact.  And nonfocal Psychiatric: Normal judgment and insight. Alert and oriented x 3. Normal mood.    Tracheostomy in place   Objective: Vitals:   01/09/21 2259 01/09/21 2319 01/10/21 0334 01/10/21 0734  BP:  126/76 131/72   Pulse: 80 73 71 74  Resp: (!) 23 19 20 16   Temp:  98.2 F (36.8 C) 98.8 F (37.1 C)   TempSrc:  Oral Oral   SpO2:  96% 95% 96%  Weight:      Height:        Intake/Output Summary (Last 24 hours) at 01/10/2021 0738 Last data filed at 01/10/2021 0521 Gross per 24 hour  Intake 760.38 ml  Output 1050 ml  Net -289.62 ml   Filed Weights   01/08/21 0240  Weight: (!) 175.8 kg     Data Reviewed:   CBC: Recent Labs  Lab 01/07/21 1426 01/08/21 0324 01/09/21 0547 01/10/21 0350  WBC 8.7 7.7 6.5 5.7  NEUTROABS 4.0 4.3  --   --   HGB 15.4 13.7 13.7 13.2  HCT 48.2 41.8 42.4 41.6  MCV 88.8 86.2  87.4 88.7  PLT 169 131* 143* 494*   Basic Metabolic Panel: Recent Labs  Lab 01/07/21 1426 01/08/21 0324 01/09/21 0547 01/10/21 0350  NA 132* 132* 131* 132*  K 4.5 4.0 3.6 4.1  CL 93* 96* 96* 100  CO2 28 26 27 26   GLUCOSE 509* 403* 210* 259*  BUN 25* 23* 19 21*  CREATININE 1.49* 1.50* 1.29* 1.54*  CALCIUM 9.1 8.9 8.4* 8.5*  MG  --  1.9 2.0 1.9    GFR: Estimated Creatinine Clearance: 81.4 mL/min (A) (by C-G formula based on SCr of 1.54 mg/dL (H)). Liver Function Tests: Recent Labs  Lab 01/08/21 0324  AST 21  ALT 20  ALKPHOS 145*  BILITOT 0.9  PROT 7.2  ALBUMIN 3.3*   No results for input(s): LIPASE, AMYLASE in the last 168 hours. No results for input(s): AMMONIA in the last 168 hours. Coagulation Profile: No results for input(s): INR, PROTIME in the last 168 hours. Cardiac Enzymes: No results for input(s): CKTOTAL, CKMB, CKMBINDEX, TROPONINI in the last 168 hours. BNP (last 3 results) No results for input(s): PROBNP in the last 8760 hours. HbA1C: Recent Labs    01/08/21 0324  HGBA1C 11.0*   CBG: Recent Labs  Lab 01/09/21 0612 01/09/21 1222 01/09/21 1617 01/09/21 2107 01/10/21 0606  GLUCAP 234* 321* 313* 268* 260*   Lipid Profile: No results for input(s): CHOL, HDL, LDLCALC, TRIG, CHOLHDL, LDLDIRECT in the last 72 hours. Thyroid Function Tests: No results for input(s): TSH, T4TOTAL, FREET4, T3FREE, THYROIDAB in the last 72 hours. Anemia Panel: No results for input(s): VITAMINB12, FOLATE, FERRITIN, TIBC, IRON, RETICCTPCT in the last 72 hours. Sepsis Labs: No results for input(s): PROCALCITON, LATICACIDVEN in the last 168 hours.  Recent Results (from the past 240 hour(s))  Resp Panel by RT-PCR (Flu A&B, Covid) Nasopharyngeal Swab     Status: None   Collection Time: 01/07/21  5:02 PM   Specimen: Nasopharyngeal Swab; Nasopharyngeal(NP) swabs in vial transport medium  Result Value Ref Range Status   SARS Coronavirus 2 by RT PCR NEGATIVE NEGATIVE Final    Comment: (NOTE) SARS-CoV-2 target nucleic acids are NOT DETECTED.  The SARS-CoV-2 RNA is generally detectable in upper respiratory specimens during the acute phase of infection. The lowest concentration of SARS-CoV-2 viral copies this assay can detect is 138 copies/mL. A negative result does not preclude SARS-Cov-2 infection and should not be used as  the sole basis for treatment or other patient management decisions. A negative result may occur with  improper specimen collection/handling, submission of specimen other than nasopharyngeal swab, presence of viral mutation(s) within the areas targeted by this assay, and inadequate number of viral copies(<138 copies/mL). A negative result must be combined with clinical observations, patient history, and epidemiological information. The expected result is Negative.  Fact Sheet for Patients:  EntrepreneurPulse.com.au  Fact Sheet for Healthcare Providers:  IncredibleEmployment.be  This test is no t yet approved or cleared by the [place] FDA and  has been authorized for detection and/or diagnosis of SARS-CoV-2 by FDA under an Emergency Use Authorization (EUA). This EUA will remain  in effect (meaning this test can be used) for the duration of the COVID-19 declaration under Section 564(b)(1) of the Act, 21 U.S.C.section 360bbb-3(b)(1), unless the authorization is terminated  or revoked sooner.       Influenza A by PCR NEGATIVE NEGATIVE Final   Influenza B by PCR NEGATIVE NEGATIVE Final    Comment: (NOTE) The Xpert Xpress SARS-CoV-2/FLU/RSV plus assay is intended as an  aid in the diagnosis of influenza from Nasopharyngeal swab specimens and should not be used as a sole basis for treatment. Nasal washings and aspirates are unacceptable for Xpert Xpress SARS-CoV-2/FLU/RSV testing.  Fact Sheet for Patients: EntrepreneurPulse.com.au  Fact Sheet for Healthcare Providers: IncredibleEmployment.be  This test is not yet approved or cleared by the [place] FDA and has been authorized for detection and/or diagnosis of SARS-CoV-2 by FDA under an Emergency Use Authorization (EUA). This EUA will remain in effect (meaning this test can be used) for the duration of the COVID-19 declaration under Section 564(b)(1) of  the Act, 21 U.S.C. section 360bbb-3(b)(1), unless the authorization is terminated or revoked.  Performed at [facility], [place] [address], [place], [place] [address]          Radiology Studies: No results found.      Scheduled Meds:  allopurinol  100 mg Oral Daily   atorvastatin  10 mg Oral Daily   budesonide (PULMICORT) nebulizer solution  0.25 mg Nebulization BID   chlorhexidine  15 mL Mouth Rinse BID   gabapentin  200 mg Oral TID   insulin aspart  0-20 Units Subcutaneous TID WC   insulin aspart  6 Units Subcutaneous TID WC   insulin glargine-yfgn  40 Units Subcutaneous BID   mouth rinse  15 mL Mouth Rinse q12n4p   metoprolol tartrate  12.5 mg Oral BID   pantoprazole  40 mg Oral QHS   QUEtiapine  100 mg Oral BID   sertraline  100 mg Oral Daily   torsemide  10 mg Oral Daily   Continuous Infusions:  sodium chloride 100 mL/hr at 01/10/21 0617   heparin 1,650 Units/hr (01/10/21 0521)     LOS: 3 days   Time spent= 35 mins    Timothy Crompton Arsenio Loader, MD [facility]  If 7PM-7AM, please contact night-coverage  01/10/2021, 7:38 AM

## 2021-01-10 NOTE — Progress Notes (Signed)
PT Cancellation Note  Patient Details Name: DAISY MCNEEL MRN: [id number] DOB: [birth year]   Cancelled Treatment:    Reason Eval/Treat Not Completed: Patient at procedure or test/unavailable. Pt leaving unit for thrombectomy upon arrival of PT, will continue to follow and evaluate after the procedure.   West Carbo, PT, DPT   Acute Rehabilitation Department Pager #: [phone]   Sandra Cockayne 01/10/2021, 2:06 PM

## 2021-01-10 NOTE — Interval H&P Note (Signed)
History and Physical Interval Note:  01/10/2021 1:57 PM  Timothy Le  has presented today for surgery, with the diagnosis of DVT.  The various methods of treatment have been discussed with the patient and family. After consideration of risks, benefits and other options for treatment, the patient has consented to  Procedure(s): LOWER EXTREMITY VENOGRAPHY (N/A) IVC VENOGRAPHY - VENA CAVA (N/A) as a surgical intervention.  The patient's history has been reviewed, patient examined, no change in status, stable for surgery.  I have reviewed the patient's chart and labs.  Questions were answered to the patient's satisfaction.     Deitra Mayo

## 2021-01-10 NOTE — Progress Notes (Signed)
OT Cancellation Note  Patient Details Name: WELLES WALTHALL MRN: [id number] DOB: [birth year]   Cancelled Treatment:    Reason Eval/Treat Not Completed: Other (comment) (holding until after surgery) Pt with pending surgery today so will hold until after thrombectomy.   Billey Chang, OTR/L  Acute Rehabilitation Services Pager: [phone] Office: [phone] .  01/10/2021, 10:52 AM

## 2021-01-10 NOTE — Progress Notes (Addendum)
ANTICOAGULATION CONSULT NOTE - Follow Up Consult  Pharmacy Consult for Heparin Indication:  new VTE  No Known Allergies  Patient Measurements: Height: 5\' 8"  (172.7 cm) Weight: (!) 175.8 kg (387 lb 9.1 oz) IBW/kg (Calculated) : 68.4 Heparin Dosing Weight: 112.6kg  Vital Signs: Temp: 98.1 F (36.7 C) (12/23 0750) Temp Source: Oral (12/23 0750) BP: 138/86 (12/23 0750) Pulse Rate: 65 (12/23 0750)  Labs: Recent Labs    01/08/21 0324 01/08/21 1118 01/09/21 0547 01/09/21 1512 01/10/21 0350  HGB 13.7  --  13.7  --  13.2  HCT 41.8  --  42.4  --  41.6  PLT 131*  --  143*  --  128*  HEPARINUNFRC 0.71*   < > 0.84* 0.64 0.56  CREATININE 1.50*  --  1.29*  --  1.54*   < > = values in this interval not displayed.    Estimated Creatinine Clearance: 81.4 mL/min (A) (by C-G formula based on SCr of 1.54 mg/dL (H)).   Assessment: 60 yo M with history of LLE DVT in Nov 2021 s/p Wallstent of left common iliac vein as well as balloon angioplasty of left common femoral vein. Patient was on apixaban Nov 2021 to April 2022. At follow up in Sept 2022, no further clot or indication to continue so patient remained off apixaban. Admitted 01/07/21 with new LLE DVT in left external iliac vein. LLE Stent patent.  Pharmacy consulted for heparin.    Heparin level therapeutic at 0.56 on 1650 units/hr. CBC stable.   Goal of Therapy:  Heparin level 0.3-0.7 units/ml Monitor platelets by anticoagulation protocol: Yes   Plan:  Continue IV heparin at 1650 units/hr Monitor daily HL, CBC, and for s/sx of bleeding  F/u restart apixaban   ADDENDUM 15:50: heparin consult received to start heparin for VTE treatment now post- LE venography. Will resume at 1650 units/hr and f/u AM HL.   Benetta Spar, PharmD, BCPS, BCCP Clinical Pharmacist  Please check AMION for all [place] phone numbers After 10:00 PM, call [place] [phone]

## 2021-01-10 NOTE — Progress Notes (Incomplete)
Inpatient Diabetes Program Recommendations  AACE/ADA: New Consensus Statement on Inpatient Glycemic Control (2015)  Target Ranges:  Prepandial:   less than 140 mg/dL      Peak postprandial:   less than 180 mg/dL (1-2 hours)      Critically ill patients:  140 - 180 mg/dL   Lab Results  Component Value Date   GLUCAP 260 (H) 01/10/2021   HGBA1C 11.0 (H) 01/08/2021    Review of Glycemic Control  Latest Reference Range & Units 01/09/21 16:17 01/09/21 21:07 01/10/21 06:06  Glucose-Capillary 70 - 99 mg/dL 313 (H) 268 (H) 260 (H)  (H): Data is abnormally high  Diabetes history: Type 2 DM Outpatient Diabetes medications: Lantus 32 units BID Current orders for Inpatient glycemic control: Semglee 45 units BID, Novolog 0-20 units TID, Novolog 6 units TID   Inpatient Diabetes Program Recommendations:

## 2021-01-11 DIAGNOSIS — J439 Emphysema, unspecified: Secondary | ICD-10-CM | POA: Diagnosis not present

## 2021-01-11 DIAGNOSIS — Z95820 Peripheral vascular angioplasty status with implants and grafts: Secondary | ICD-10-CM

## 2021-01-11 DIAGNOSIS — I82412 Acute embolism and thrombosis of left femoral vein: Secondary | ICD-10-CM

## 2021-01-11 DIAGNOSIS — I82422 Acute embolism and thrombosis of left iliac vein: Secondary | ICD-10-CM

## 2021-01-11 DIAGNOSIS — I824Y2 Acute embolism and thrombosis of unspecified deep veins of left proximal lower extremity: Secondary | ICD-10-CM | POA: Diagnosis not present

## 2021-01-11 DIAGNOSIS — J9610 Chronic respiratory failure, unspecified whether with hypoxia or hypercapnia: Secondary | ICD-10-CM | POA: Diagnosis not present

## 2021-01-11 DIAGNOSIS — Z930 Tracheostomy status: Secondary | ICD-10-CM | POA: Diagnosis not present

## 2021-01-11 DIAGNOSIS — I82432 Acute embolism and thrombosis of left popliteal vein: Secondary | ICD-10-CM

## 2021-01-11 LAB — CBC
HCT: 39.7 % (ref 39.0–52.0)
Hemoglobin: 12.4 g/dL — ABNORMAL LOW (ref 13.0–17.0)
MCH: 27.5 pg (ref 26.0–34.0)
MCHC: 31.2 g/dL (ref 30.0–36.0)
MCV: 88 fL (ref 80.0–100.0)
Platelets: 126 10*3/uL — ABNORMAL LOW (ref 150–400)
RBC: 4.51 MIL/uL (ref 4.22–5.81)
RDW: 14.6 % (ref 11.5–15.5)
WBC: 6.4 10*3/uL (ref 4.0–10.5)
nRBC: 0 % (ref 0.0–0.2)

## 2021-01-11 LAB — GLUCOSE, CAPILLARY
Glucose-Capillary: 310 mg/dL — ABNORMAL HIGH (ref 70–99)
Glucose-Capillary: 181 mg/dL — ABNORMAL HIGH (ref 70–99)

## 2021-01-11 LAB — LIPID PANEL
Cholesterol: 153 mg/dL (ref 0–200)
HDL: 39 mg/dL — ABNORMAL LOW (ref >40)
LDL Cholesterol: 96 mg/dL (ref 0–99)
Total CHOL/HDL Ratio: 3.9 RATIO
Triglycerides: 91 mg/dL (ref <150)
VLDL: 18 mg/dL (ref 0–40)

## 2021-01-11 LAB — BASIC METABOLIC PANEL
Anion gap: 7 (ref 5–15)
BUN: 17 mg/dL (ref 6–20)
CO2: 27 mmol/L (ref 22–32)
Calcium: 8.2 mg/dL — ABNORMAL LOW (ref 8.9–10.3)
Chloride: 98 mmol/L (ref 98–111)
Creatinine, Ser: 1.17 mg/dL (ref 0.61–1.24)
GFR, Estimated: >60 mL/min (ref >60)
Glucose, Bld: 306 mg/dL — ABNORMAL HIGH (ref 70–99)
Potassium: 4 mmol/L (ref 3.5–5.1)
Sodium: 132 mmol/L — ABNORMAL LOW (ref 135–145)

## 2021-01-11 LAB — MAGNESIUM: Magnesium: 1.8 mg/dL (ref 1.7–2.4)

## 2021-01-11 LAB — HEPARIN LEVEL (UNFRACTIONATED): Heparin Unfractionated: 0.46 IU/mL (ref 0.30–0.70)

## 2021-01-11 MED ORDER — SODIUM CHLORIDE 0.9 % IV SOLN: INTRAVENOUS | Status: DC

## 2021-01-11 MED ORDER — APIXABAN 5 MG PO TABS: 5.0000 mg | ORAL_TABLET | Freq: Two times a day (BID) | ORAL | Status: DC

## 2021-01-11 MED ORDER — OXYCODONE HCL 5 MG PO TABS: 5.0000 mg | ORAL_TABLET | Freq: Four times a day (QID) | ORAL | 0 refills | Status: DC | PRN

## 2021-01-11 MED ORDER — APIXABAN 5 MG PO TABS
10.0000 mg | ORAL_TABLET | Freq: Two times a day (BID) | ORAL | Status: DC
  Administered 2021-01-11: 13:00:00 10 mg via ORAL
  Filled 2021-01-11: qty 2

## 2021-01-11 MED ORDER — APIXABAN 5 MG PO TABS: ORAL_TABLET | ORAL | 0 refills | Status: DC

## 2021-01-11 MED ORDER — INSULIN ASPART 100 UNIT/ML IJ SOLN
10.0000 [IU] | Freq: Three times a day (TID) | INTRAMUSCULAR | Status: DC
  Administered 2021-01-11: 13:00:00 10 [IU] via SUBCUTANEOUS

## 2021-01-11 MED ORDER — INSULIN GLARGINE-YFGN 100 UNIT/ML ~~LOC~~ SOLN
50.0000 [IU] | Freq: Two times a day (BID) | SUBCUTANEOUS | Status: DC
  Administered 2021-01-11: 10:00:00 50 [IU] via SUBCUTANEOUS
  Filled 2021-01-11 (×2): qty 0.5

## 2021-01-11 MED ORDER — SENNOSIDES-DOCUSATE SODIUM 8.6-50 MG PO TABS: 2.0000 | ORAL_TABLET | Freq: Every evening | ORAL | 0 refills | Status: DC | PRN

## 2021-01-11 NOTE — Progress Notes (Addendum)
°  Progress Note    01/11/2021 7:10 AM 1 Day Post-Op  Subjective:  no complaints. Sitting up eating breakfast. Feels like tightness/ swelling in left leg is better   Vitals:   01/11/21 0346 01/11/21 0355  BP: 140/70   Pulse: 69   Resp: 18 18  Temp: 99 F (37.2 C)   SpO2: 96%    Physical Exam: Cardiac:  regular Lungs:  non labored, tach in place Extremities:  LLE with Kerlix, ACE, Coban wrap. Palpable left DP Abdomen:  obese Neurologic: alert and oriented  CBC    Component Value Date/Time   WBC 6.4 01/11/2021 0150   RBC 4.51 01/11/2021 0150   HGB 12.4 (L) 01/11/2021 0150   HCT 39.7 01/11/2021 0150   PLT 126 (L) 01/11/2021 0150   MCV 88.0 01/11/2021 0150   MCH 27.5 01/11/2021 0150   MCHC 31.2 01/11/2021 0150   RDW 14.6 01/11/2021 0150   LYMPHSABS 2.4 01/08/2021 0324   MONOABS 0.6 01/08/2021 0324   EOSABS 0.3 01/08/2021 0324   BASOSABS 0.0 01/08/2021 0324    BMET    Component Value Date/Time   NA 132 (L) 01/11/2021 0150   NA 143 04/11/2020 1029   K 4.0 01/11/2021 0150   CL 98 01/11/2021 0150   CO2 27 01/11/2021 0150   GLUCOSE 306 (H) 01/11/2021 0150   BUN 17 01/11/2021 0150   BUN 15 04/11/2020 1029   CREATININE 1.17 01/11/2021 0150   CALCIUM 8.2 (L) 01/11/2021 0150   GFRNONAA >60 01/11/2021 0150   GFRAA 85 10/12/2019 0904    INR    Component Value Date/Time   INR 1.2 08/30/2019 0258     Intake/Output Summary (Last 24 hours) at 01/11/2021 0710 Last data filed at 01/11/2021 6720 Gross per 24 hour  Intake 1316.19 ml  Output 3175 ml  Net -1858.81 ml     Assessment/Plan:  60 y.o. male is s/p  US guided access to left popliteal vein IVUS IVC, Left CIV, EIV, CFV, FV Mechanical thrombectomy of IVC, Left CIV, EIV, CFV, FV Angioplasty and stenting of Left EIV, CFV Venogram IVC, left CIV, EIV, CFV, FV 1 Day Post-Op   Doing well post intervention Left leg swelling is subjectively improved Will place order for compression stocking vs TED hose if  they have size available otherwise will need ACE wrap to left leg. Needs to wear compression daily Elevate left leg daily while in bed Will have RN remove bolster behind left knee this afternoon Continue IV heparin. Will need to transition to [place] on d/c which he will need indefinitely He will have follow up arranged in our office in 1 month with IVC/ Iliac and LLE venous duplex  DVT prophylaxis:  IV heparin    Karoline Caldwell, PA-C [facility] [phone] 01/11/2021 7:10 AM  VASCULAR STAFF ADDENDUM: I have independently interviewed and examined the patient. I agree with the above.  Symptomatic improvement. Doing well ambulating about the halls. Safe for discharge from my standpoint. Needs DOAC indefinitely. Remove bolster prior to discharge. Follow up with [facility] in 1 month as arranged. Please call for questions.  Yevonne Aline. Stanford Breed, MD [facility] Phone Number: [phone] 01/11/2021 11:12 AM

## 2021-01-11 NOTE — Progress Notes (Addendum)
Received referral to assist pt with [facility]. Met with pt at bedside. He lives alone. He plans to return home with the support of his family and friends. He reports he has good support. He doesn't drive. His niece provides transportation and she is taking him home when he is D/C. Discussed [place] services. Informed pt that I won't be able to arrange [facility] PT due to his medical insurance (Medicaid) and the [facility] agencies won't accept Medicaid. He reports that he really doesn't need HH. He reports that this is his second admission and the first time he got around well. He reports that he can do his own exercises and his niece can help. He has a RW, cane and a shower chair. Provided pt with an Eliquis 30-day free card. He denies any issues filling his prescriptions.

## 2021-01-11 NOTE — Evaluation (Signed)
Occupational Therapy Evaluation Patient Details Name: Timothy Le MRN: [id number] DOB: 1960-12-17 Today's Date: 01/11/2021   History of Present Illness Pt is a 60 y.o. male admitted 01/07/21 with LLE swelling, has been off Eliquis for 6 months. 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 S/p mechanical thrombectomy of the IVC, left common iliac vein, left external iliac vein, left common femoral vein, and left femoral vein on 12/23. PMH includes DM2, COPD, CHF, obesity, chronic respiratory failure (trach dependent).   Clinical Impression   Patient admitted for the diagnosis and procedure above.  PTA he lives alone in his apartment.  He is generally Mod I with mobility in the home, and with ADL.  Friends and family assist as needed.  OT issued and reviewed use of Reacher and long handled sponge for lower body ADL, good understanding and no significant OT issues in the acute setting.  Encouraged mobility in the hospital.          Recommendations for follow up therapy are one component of a multi-disciplinary discharge planning process, led by the attending physician.  Recommendations may be updated based on patient status, additional functional criteria and insurance authorization.   Follow Up Recommendations  No OT follow up    Assistance Recommended at Discharge PRN  Functional Status Assessment  Patient has not had a recent decline in their functional status  Equipment Recommendations  None recommended by OT    Recommendations for Other Services       Precautions / Restrictions Precautions Precautions: Fall Restrictions Weight Bearing Restrictions: No      Mobility Bed Mobility Overal bed mobility: Modified Independent             General bed mobility comments: up in recliner Patient Response: Cooperative  Transfers Overall transfer level: Needs assistance Equipment used: Rolling walker (2 wheels) Transfers: Sit to/from Stand Sit to Stand: Supervision            General transfer comment: supervision for safety, good ability with RW      Balance Overall balance assessment: Needs assistance Sitting-balance support: Feet supported Sitting balance-Leahy Scale: Good Sitting balance - Comments: able to reach outside BOS   Standing balance support: Bilateral upper extremity supported Standing balance-Leahy Scale: Poor Standing balance comment: dependent on BUE support for gait                           ADL either performed or assessed with clinical judgement   ADL Overall ADL's : At baseline                                             Vision Patient Visual Report: No change from baseline       Perception Perception Perception: Not tested   Praxis Praxis Praxis: Not tested    Pertinent Vitals/Pain Pain Assessment: Faces Faces Pain Scale: Hurts a little bit Pain Location: LLE Pain Descriptors / Indicators: Operative site guarding Pain Intervention(s): Monitored during session     Hand Dominance Right   Extremity/Trunk Assessment Upper Extremity Assessment Upper Extremity Assessment: Overall WFL for tasks assessed   Lower Extremity Assessment Lower Extremity Assessment: Defer to PT evaluation LLE Deficits / Details: soreness with AROM, but able to use functionally LLE Sensation: WNL LLE Coordination: WNL   Cervical / Trunk Assessment Cervical / Trunk Assessment: Other exceptions Cervical /  Trunk Exceptions: large body habitus   Communication Communication Communication: No difficulties   Cognition Arousal/Alertness: Awake/alert Behavior During Therapy: WFL for tasks assessed/performed Overall Cognitive Status: Within Functional Limits for tasks assessed                                 General Comments: pt with mild impulsivity, but able to follow all cues and commands. able to describe home set up and how he manages     General Comments  VSS on 5L trach collar     Exercises     Shoulder Instructions      Home Living Family/patient expects to be discharged to:: Private residence Living Arrangements: Alone Available Help at Discharge: Friend(s);Family;Available PRN/intermittently Type of Home: House Home Access: Stairs to enter [employer] of Steps: 3 Entrance Stairs-Rails: Right;Left Home Layout: One level     Bathroom Shower/Tub: Teacher, early years/pre: Standard Bathroom Accessibility: Yes   Home Equipment: Conservation officer, nature (2 wheels);Cane - single point;Shower seat          Prior Functioning/Environment Prior Level of Function : Independent/Modified Independent             Mobility Comments: pt mobilizing with use of cane, no assist needed for ADLs, reports neice assists with running errands ADLs Comments: pt reports independent with ADLs, assist with IADLs        OT Problem List: Impaired balance (sitting and/or standing);Pain      OT Treatment/Interventions:      OT Goals(Current goals can be found in the care plan section) Acute Rehab OT Goals Patient Stated Goal: Get back home OT Goal Formulation: With patient Time For Goal Achievement: 01/13/21 Potential to Achieve Goals: Good  OT Frequency:     Barriers to D/C:            Co-evaluation              AM-PAC OT "6 Clicks" Daily Activity     Outcome Measure Help from another person eating meals?: None Help from another person taking care of personal grooming?: None Help from another person toileting, which includes using toliet, bedpan, or urinal?: None Help from another person bathing (including washing, rinsing, drying)?: None Help from another person to put on and taking off regular upper body clothing?: None Help from another person to put on and taking off regular lower body clothing?: None 6 Click Score: 24   End of Session Equipment Utilized During Treatment: Oxygen  Activity Tolerance: Patient tolerated treatment  well Patient left: in chair;with call bell/phone within reach  OT Visit Diagnosis: Pain Pain - Right/Left: Left Pain - part of body: Leg                Time: 1152-1209 OT Time Calculation (min): 17 min Charges:  OT General Charges $OT Visit: 1 Visit OT Evaluation $OT Eval Moderate Complexity: 1 Mod  01/11/2021  RP, OTR/L  Acute Rehabilitation Services  Office:  [phone]   Metta Clines 01/11/2021, 12:13 PM

## 2021-01-11 NOTE — Discharge Summary (Signed)
Physician Discharge Summary  Timothy Le BEM:[id number] DOB: 1961-01-19 DOA: 01/07/2021  PCP: Vevelyn Francois, NP  Admit date: 01/07/2021 Discharge date: 01/11/2021  Admitted From: Home Disposition: Home  Recommendations for Outpatient Follow-up:  Follow up with PCP in 1-2 weeks Please obtain BMP/CBC in one week your next doctors visit.  Follow-up outpatient vascular surgery in about 1 month, to be arranged by their service. Eliquis to be taken indefinitely Would benefit from outpatient referral to endocrinology Pain medication, Oxy IR,  with bowel regimen prescribed.   Discharge Condition: Stable CODE STATUS: Full code Diet recommendation: Diabetic  Brief/Interim Summary: 60 year old with history of DM2, morbid obesity, chronic hypoxia, severe OSA requiring trach placement, HTN, diastolic CHF, extensive left lower extremity DVT status post mechanical thrombectomy, angioplasty in November 2021 admitted for left lower extremity swelling.  He has been off Eliquis for 6 months, ultrasound back in November 2022 was negative for DVT therefore not restarted but now presents back again with extensive lower extremity DVT.  Vascular team is following the patient. Underwent successful thrombectomy, Angioplasty.  Vascular recommended lifelong anticoagulation, Eliquis prescribed.  Case discussed with Dr. Stanford Breed.  Their service will arrange for outpatient follow-up as well.     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  Status  post OR by vascular with successful mechanical thrombectomy, angioplasty and stenting on 01/10/2021.  Cleared for discharge.  Continue Ace wrap of his left lower extremity. I spoke with Dr. Stanford Breed regarding his care, their service will arrange for outpatient follow-up.  Appreciate his input. -Should be on lifelong anticoagulation unless contraindicated   Diabetes mellitus type 2, insulin-dependent. 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  Supportive care.  Patient is trach dependent, routine trach care. Trach changed by resp.    Chronic diastolic congestive heart failure - Resume home regimen    Body mass index is 58.93 kg/m.         Discharge Diagnoses:  Principal Problem:   Acute deep vein thrombosis (DVT) of left lower extremity (HCC) Active Problems:   COPD (chronic obstructive pulmonary disease) (HCC)   Type 2 diabetes mellitus with hyperglycemia, with long-term current use of insulin (HCC)   Tracheostomy dependence (HCC)   Chronic respiratory failure due to obstructive sleep apnea (HCC)   Chronic diastolic CHF (congestive heart failure) ([place])      Consultations: Vascular surgery  Subjective: Patient feels well, no complaints.  Discharge Exam: Vitals:   01/11/21 0906 01/11/21 1138  BP:  123/77  Pulse:  73  Resp:  18  Temp:  98.2 F (36.8 C)  SpO2: 96% 91%   Vitals:   01/11/21 0355 01/11/21 0812 01/11/21 0906 01/11/21 1138  BP:  130/62  123/77  Pulse:  71  73  Resp: _0 Temp:  98.3 F (36.8 C)  98.2 F (36.8  C)  TempSrc:  Oral  Oral  SpO2:  95% 96% 91%  Weight:      Height:        General: Pt is alert, awake, not in acute distress Cardiovascular: RRR, S1/S2 +, no rubs, no  gallops Respiratory: CTA bilaterally, no wheezing, no rhonchi Abdominal: Soft, NT, ND, bowel sounds + Extremities: no edema, no cyanosis  Discharge Instructions   Allergies as of 01/11/2021   No Known Allergies      Medication List     TAKE these medications    acetaminophen 325 MG tablet Commonly known as: TYLENOL Take 1-2 tablets (325-650 mg total) by mouth every 4 (four) hours as needed for mild pain.   allopurinol 100 MG tablet Commonly known as: ZYLOPRIM Take 1 tablet (100 mg total) by mouth daily.   apixaban 5 MG Tabs tablet Commonly known as: ELIQUIS Take 2 tablets (10 mg total) by mouth 2 (two) times daily for 2 days, THEN 1 tablet (5 mg total) 2 (two) times daily for 28 days. Start taking on: January 13, 2021   atorvastatin 10 MG tablet Commonly known as: LIPITOR Take 1 tablet (10 mg total) by mouth daily.   BD Insulin Syringe U/F 31G X 5/16" 0.5 ML Misc Generic drug: Insulin Syringe-Needle U-100 USE FOR DIABETIC TESTING   blood glucose meter kit and supplies Dispense based on patient and insurance preference. Use up to four times daily as directed. (FOR ICD-10 E10.9, E11.9).   diclofenac Sodium 1 % Gel Commonly known as: VOLTAREN Apply 2 g topically 4 (four) times daily. What changed:  when to take this reasons to take this   gabapentin 100 MG capsule Commonly known as: NEURONTIN Take 2 capsules (200 mg total) by mouth 3 (three) times daily.   Insulin Syringes (Disposable) U-100 0.5 ML Misc 1 Syringe by Does not apply route 3 (three) times daily after meals.   Lantus 100 UNIT/ML injection Generic drug: insulin glargine Inject 0.32 mLs (32 Units total) into the skin 2 (two) times daily.   metoprolol tartrate 25 MG tablet Commonly known as: LOPRESSOR Take 0.5 tablets (12.5 mg total) by mouth 2 (two) times daily.   mometasone-formoterol 100-5 MCG/ACT Aero Commonly known as: DULERA Inhale 2 puffs into the lungs 2 (two) times daily for 8 days.    Mucinex Fast-Max Congest Cough 2.5-5-100 MG/5ML Liqd Generic drug: Phenylephrine-DM-GG Take 20 mLs by mouth daily as needed (cough and chest congestion).   oxyCODONE 5 MG immediate release tablet Commonly known as: Oxy IR/ROXICODONE Take 1 tablet (5 mg total) by mouth every 6 (six) hours as needed for moderate pain or severe pain.   pantoprazole 40 MG tablet Commonly known as: PROTONIX Take 1 tablet (40 mg total) by mouth at bedtime.   potassium chloride SA 20 MEQ tablet Commonly known as: KLOR-CON M Take 1 tablet (20 mEq total) by mouth 2 (two) times daily.   QUEtiapine 100 MG tablet Commonly known as: SEROQUEL Take 1 tablet (100 mg total) by mouth 2 (two) times daily.   senna-docusate 8.6-50 MG tablet Commonly known as: Senokot-S Take 2 tablets by mouth at bedtime as needed for moderate constipation.   torsemide 10 MG tablet Commonly known as: DEMADEX Take 1 tablet (10 mg total) by mouth daily.        Follow-up Information     [facility] Follow up.   Contact information: [address] [place] [phone]  No Known Allergies  You were cared for by a hospitalist during your hospital stay. If you have any questions about your discharge medications or the care you received while you were in the hospital after you are discharged, you can call the unit and asked to speak with the hospitalist on call if the hospitalist that took care of you is not available. Once you are discharged, your primary care physician will handle any further medical issues. Please note that no refills for any discharge medications will be authorized once you are discharged, as it is imperative that you return to your primary care physician (or establish a relationship with a primary care physician if you do not have one) for your aftercare needs so that they can reassess your need for medications and monitor your lab  values.   Procedures/Studies: CT Angio Chest PE W and/or Wo Contrast  Result Date: 01/07/2021 CLINICAL DATA:  Concern for pulmonary embolism. Concern for deep venous thrombosis in the iliac veins. EXAM: CT ANGIOGRAPHY CHEST CT venogram ABDOMEN AND PELVIS WITH CONTRAST TECHNIQUE: Multidetector CT imaging of the chest was performed using the standard protocol during bolus administration of intravenous contrast. Multiplanar CT image reconstructions and MIPs were obtained to evaluate the vascular anatomy. Multidetector CT imaging of the abdomen and pelvis was performed using the delayed standard protocol during bolus administration of intravenous contrast. CONTRAST:  137m OMNIPAQUE IOHEXOL 350 MG/ML SOLN COMPARISON:  None. FINDINGS: CTA CHEST FINDINGS Cardiovascular: No filling defects within the pulmonary arteries to suggest acute pulmonary embolism. No significant vascular findings. Normal heart size. No pericardial effusion. Mediastinum/Nodes: No axillary or supraclavicular adenopathy. No mediastinal or hilar adenopathy. No pericardial fluid. Esophagus normal. Lungs/Pleura: No pulmonary infarction. Mild atelectasis LEFT lower lobe. No pneumonia. Musculoskeletal: Degenerative osteophytosis of the spine. Review of the MIP images confirms the above findings. CT ABDOMEN and PELVIS FINDINGS Hepatobiliary: Liver is normal.  Small sludge in the gallbladder. Pancreas: Pancreas is normal. No ductal dilatation. No pancreatic inflammation. Spleen: Normal spleen Adrenals/urinary tract: Adrenal glands and kidneys are normal. The ureters and bladder normal. Stomach/Bowel: Stomach, small bowel, appendix, and cecum are normal. The colon and rectosigmoid colon are normal. Vascular/Lymphatic: Abdominal aorta normal. Vascular stent within the LEFT common iliac vein. No evidence of thrombosis within the iliac veins although the bolus timing does not well opacified the veins. Reproductive: Unremarkable Other: No free fluid.  Musculoskeletal: No aggressive osseous lesion. Review of the MIP images confirms the above findings. IMPRESSION: Chest Impression: 1. No evidence acute pulmonary embolism. 2. No acute pulmonary parenchymal findings. Abdomen / Pelvis Impression: 1. No evidence of venous thrombosis in iliac veins although they are not well opacified. 2. LEFT common iliac vein stent is expanded. Electronically Signed   By: SSuzy BouchardM.D.   On: 01/07/2021 21:43   PERIPHERAL VASCULAR CATHETERIZATION  Result Date: 01/10/2021 Table formatting from the original result was not included. PATIENT: DJOSUEL KOEPPEN     MRN: [id number]: [birth year]   DATE OF PROCEDURE: 01/10/2021 INDICATIONS:  DYUSEF LAMPis a 60y.o. male who presented with a recurrent left LE DVT.  He had previously undergone mechanical thrombectomy and angioplasty and stenting of the left common and external iliac veins with a 164mWallstent.  He had been off anticoagulation and presented with a swollen left leg and was found to have a new clot in the left leg involving the external iliac vein, common femoral vein, and femoral vein.  He presents for mechanical thrombectomy.  PROCEDURE:  US guided access to left popliteal vein IVUS IVC, Left CIV, EIV, CFV, FV Mechanical thrombectomy of IVC, Left CIV, EIV, CFV, FV Angioplasty and stenting of Left EIV, CFV Venogram IVC, left CIV, EIV, CFV, FV SURGEON: Christopher S. Scot Dock, MD, ASSIST: Servando Snare, MD ANESTHESIA: Local with sedation EBL: 50 cc TECHNIQUE: The patient was brought to the peripheral vascular lab and was sedated. He was placed prone.The period of conscious sedation was 71 minutes.  During that time period, I was present face-to-face 100% of the time.  The patient was administered 1 mg versed, 50 mcg Fentanyl. The patient's heart rate, blood pressure, and oxygen saturation were monitored by the nurse continuously during the procedure. Both popliteal fossas were prepped and draped.  Under  ultrasound guidance, after the skin was anesthetized, the left popliteal vein was cannulated with a micropuncture needle and a micropuncture sheath introduced over a wire.  A Glidewire advantage was then advanced into the femoral vein and the micropuncture sheath exchanged for an 8 French sheath.  A venogram was obtained to confirm we were in the vein.  Next IVUS was performed from the femoral vein to include the common femoral vein, external iliac vein, common iliac vein, and IVC.  There was clot present in the IVC above the stent.  The stent itself was patent with some chronic clot at the distal aspect of the stent in the external iliac vein. Mechanical thrombectomy was then performed using the  Inari mechanical thrombectomy device.  The 8 French sheath was exchanged for the 70 French sheath in the left popliteal space a large amount of chronic and acute clot was retrieved from the IVC, external iliac vein, and common femoral vein.  5 passes were made.  IVUS was repeated and there was excellent improvement.  There was an area of narrowing noted at the distal aspect of the previous Wallstent and this was addressed with angioplasty and stenting.  We selected a Abre 16 mm by 60 mm stent this was deployed based on IVUS.  Postdilatation was done with a Mustang balloon (12 mm x 60 mm.  Follow-up venogram was obtained to evaluate the femoral vein, common femoral vein, external iliac vein and the stent.  These were all widely patent. At the completion a bolster suture was placed around the cannulation site after the sheath was removed. FINDINGS: Acute on chronic DVT involving the IVC, the left external iliac vein and femoral vein. Successful mechanical thrombectomy of the IVC, left common iliac vein, left external iliac vein, left common femoral vein, and left femoral vein. Angioplasty and stenting of the left external iliac vein and common femoral vein with a 16 mm x 60 mm Abre stent. Deitra Mayo, MD, FACS  [facility] DATE OF DICTATION:   01/10/2021  VAS [place] IVC/ILIAC (VENOUS ONLY)  Result Date: 01/07/2021 IVC/ILIAC STUDY Patient Name:  FIRMIN BELISLE  Date of Exam:   01/07/2021 Medical Rec #: [id number]         Accession #:    [id number] Date of Birth: 1960/12/09         Patient Gender: M Patient Age:   29 years Exam Location:  [facility] Procedure:      VAS [place] IVC/ILIAC (VENOUS ONLY) Referring Phys: Carlisle Cater --------------------------------------------------------------------------------  Indications: Left lower extremity DVT Other Factors: History of DVT. Limitations: Air/bowel gas and obesity.  Comparison Study: 12/07/2019 IVC/iliac vein duplex Performing Technologist: Maudry Mayhew MHA, RDMS, RVT, RDCS  Examination  Guidelines: A complete evaluation includes B-mode imaging, spectral Doppler, color Doppler, and power Doppler as needed of all accessible portions of each vessel. Bilateral testing is considered an integral part of a complete examination. Limited examinations for reoccurring indications may be performed as noted.  IVC/Iliac Findings: +----------+------+--------+--------+     IVC     Patent Thrombus Comments  +----------+------+--------+--------+  IVC Prox   patent                    +----------+------+--------+--------+  IVC Mid    patent                    +----------+------+--------+--------+  IVC Distal patent                    +----------+------+--------+--------+  Unable to visualize bilateral CIVs +-------------------------+---------+-----------+---------+-----------+--------+             EIV            RT-Patent RT-Thrombus LT-Patent LT-Thrombus Comments  +-------------------------+---------+-----------+---------+-----------+--------+  External Iliac Vein                                          acute               Distal                                                                           +-------------------------+---------+-----------+---------+-----------+--------+   Summary: IVC/Iliac: There is no evidence of thrombus involving the IVC. There is no evidence of thrombus involving the right external iliac vein. There is evidence of acute thrombus involving the left external iliac vein. Unable to visualzie bilatearl common iliac veins.  *See table(s) above for measurements and observations.  Electronically signed by Deitra Mayo MD on 01/07/2021 at 56:14:18 PM.    Final    CT VENOGRAM ABD/PEL  Result Date: 01/07/2021 CLINICAL DATA:  Concern for pulmonary embolism. Concern for deep venous thrombosis in the iliac veins. EXAM: CT ANGIOGRAPHY CHEST CT venogram ABDOMEN AND PELVIS WITH CONTRAST TECHNIQUE: Multidetector CT imaging of the chest was performed using the standard protocol during bolus administration of intravenous contrast. Multiplanar CT image reconstructions and MIPs were obtained to evaluate the vascular anatomy. Multidetector CT imaging of the abdomen and pelvis was performed using the delayed standard protocol during bolus administration of intravenous contrast. CONTRAST:  16m OMNIPAQUE IOHEXOL 350 MG/ML SOLN COMPARISON:  None. FINDINGS: CTA CHEST FINDINGS Cardiovascular: No filling defects within the pulmonary arteries to suggest acute pulmonary embolism. No significant vascular findings. Normal heart size. No pericardial effusion. Mediastinum/Nodes: No axillary or supraclavicular adenopathy. No mediastinal or hilar adenopathy. No pericardial fluid. Esophagus normal. Lungs/Pleura: No pulmonary infarction. Mild atelectasis LEFT lower lobe. No pneumonia. Musculoskeletal: Degenerative osteophytosis of the spine. Review of the MIP images confirms the above findings. CT ABDOMEN and PELVIS FINDINGS Hepatobiliary: Liver is normal.  Small sludge in the gallbladder. Pancreas: Pancreas is normal. No ductal dilatation. No pancreatic inflammation. Spleen: Normal spleen Adrenals/urinary  tract: Adrenal glands and kidneys are normal. The ureters and bladder normal. Stomach/Bowel: Stomach, small bowel, appendix, and cecum  are normal. The colon and rectosigmoid colon are normal. Vascular/Lymphatic: Abdominal aorta normal. Vascular stent within the LEFT common iliac vein. No evidence of thrombosis within the iliac veins although the bolus timing does not well opacified the veins. Reproductive: Unremarkable Other: No free fluid. Musculoskeletal: No aggressive osseous lesion. Review of the MIP images confirms the above findings. IMPRESSION: Chest Impression: 1. No evidence acute pulmonary embolism. 2. No acute pulmonary parenchymal findings. Abdomen / Pelvis Impression: 1. No evidence of venous thrombosis in iliac veins although they are not well opacified. 2. LEFT common iliac vein stent is expanded. Electronically Signed   By: Suzy Bouchard M.D.   On: 01/07/2021 21:43   VAS [place] LOWER EXTREMITY VENOUS (DVT) (ONLY [facility] & [facility])  Result Date: 01/07/2021  Lower Venous DVT Study Patient Name:  JORY TANGUMA  Date of Exam:   01/07/2021 Medical Rec #: [id number]         Accession #:    [id number] Date of Birth: [birth year]         Patient Gender: M Patient Age:   48 years Exam Location:  [facility] Procedure:      VAS [place] LOWER EXTREMITY VENOUS (DVT) Referring Phys: Vonna Kotyk GEIPLE --------------------------------------------------------------------------------  Indications: Edema, and Pain.  Comparison       12/07/2019- age indeterminate DVT throughout left lower Study:           extremity Performing Technologist: Maudry Mayhew MHA, RDMS, RVT, RDCS  Examination Guidelines: A complete evaluation includes B-mode imaging, spectral Doppler, color Doppler, and power Doppler as needed of all accessible portions of each vessel. Bilateral testing is considered an integral part of a complete examination. Limited examinations for reoccurring indications may be performed as noted. The reflux portion of the  exam is performed with the patient in reverse Trendelenburg.  +-----+---------------+---------+-----------+----------+--------------+  RIGHT Compressibility Phasicity Spontaneity Properties Thrombus Aging  +-----+---------------+---------+-----------+----------+--------------+  CFV   Full            Yes       Yes                                    +-----+---------------+---------+-----------+----------+--------------+   +---------+---------------+---------+-----------+----------+--------------+  LEFT      Compressibility Phasicity Spontaneity Properties Thrombus Aging  +---------+---------------+---------+-----------+----------+--------------+  CFV       None                      No                     Acute           +---------+---------------+---------+-----------+----------+--------------+  SFJ       None                                             Acute           +---------+---------------+---------+-----------+----------+--------------+  FV Prox   None                      No                     Acute           +---------+---------------+---------+-----------+----------+--------------+  FV Mid  No                                     +---------+---------------+---------+-----------+----------+--------------+  FV Distal                           No                                     +---------+---------------+---------+-----------+----------+--------------+  PFV       None                      No                     Acute           +---------+---------------+---------+-----------+----------+--------------+  POP       None                      No                     Acute           +---------+---------------+---------+-----------+----------+--------------+   Left Technical Findings: Not visualized segments include PTV, peroneal veins.   Summary: LEFT: - Findings consistent with acute deep vein thrombosis involving the left common femoral vein, SF junction, left femoral vein, left  proximal profunda vein, and left popliteal vein. - No cystic structure found in the popliteal fossa.  *See table(s) above for measurements and observations. Electronically signed by Deitra Mayo MD on 01/07/2021 at 6:11:52 PM.    Final      The results of significant diagnostics from this hospitalization (including imaging, microbiology, ancillary and laboratory) are listed below for reference.     Microbiology: Recent Results (from the past 240 hour(s))  Resp Panel by RT-PCR (Flu A&B, Covid) Nasopharyngeal Swab     Status: None   Collection Time: 01/07/21  5:02 PM   Specimen: Nasopharyngeal Swab; Nasopharyngeal(NP) swabs in vial transport medium  Result Value Ref Range Status   SARS Coronavirus 2 by RT PCR NEGATIVE NEGATIVE Final    Comment: (NOTE) SARS-CoV-2 target nucleic acids are NOT DETECTED.  The SARS-CoV-2 RNA is generally detectable in upper respiratory specimens during the acute phase of infection. The lowest concentration of SARS-CoV-2 viral copies this assay can detect is 138 copies/mL. A negative result does not preclude SARS-Cov-2 infection and should not be used as the sole basis for treatment or other patient management decisions. A negative result may occur with  improper specimen collection/handling, submission of specimen other than nasopharyngeal swab, presence of viral mutation(s) within the areas targeted by this assay, and inadequate number of viral copies(<138 copies/mL). A negative result must be combined with clinical observations, patient history, and epidemiological information. The expected result is Negative.  Fact Sheet for Patients:  EntrepreneurPulse.com.au  Fact Sheet for Healthcare Providers:  IncredibleEmployment.be  This test is no t yet approved or cleared by the [place] FDA and  has been authorized for detection and/or diagnosis of SARS-CoV-2 by FDA under an Emergency Use Authorization (EUA).  This EUA will remain  in effect (meaning this test can be used) for the duration of the COVID-19 declaration under Section 564(b)(1) of the Act, 21 U.S.C.section 360bbb-3(b)(1), unless the authorization is terminated  or revoked sooner.       Influenza A by PCR NEGATIVE NEGATIVE Final   Influenza B by PCR NEGATIVE NEGATIVE Final    Comment: (NOTE) The Xpert Xpress SARS-CoV-2/FLU/RSV plus assay is intended as an aid in the diagnosis of influenza from Nasopharyngeal swab specimens and should not be used as a sole basis for treatment. Nasal washings and aspirates are unacceptable for Xpert Xpress SARS-CoV-2/FLU/RSV testing.  Fact Sheet for Patients: EntrepreneurPulse.com.au  Fact Sheet for Healthcare Providers: IncredibleEmployment.be  This test is not yet approved or cleared by the [place] FDA and has been authorized for detection and/or diagnosis of SARS-CoV-2 by FDA under an Emergency Use Authorization (EUA). This EUA will remain in effect (meaning this test can be used) for the duration of the COVID-19 declaration under Section 564(b)(1) of the Act, 21 U.S.C. section 360bbb-3(b)(1), unless the authorization is terminated or revoked.  Performed at [facility], [place] [address], [place], [place] [address]   Surgical PCR screen     Status: Abnormal   Collection Time: 01/10/21  5:14 AM   Specimen: Nasal Mucosa; Nasal Swab  Result Value Ref Range Status   MRSA, PCR NEGATIVE NEGATIVE Final   Staphylococcus aureus POSITIVE (A) NEGATIVE Final    Comment: (NOTE) The Xpert SA Assay (FDA approved for NASAL specimens in patients 35 years of age and older), is one component of a comprehensive surveillance program. It is not intended to diagnose infection nor to guide or monitor treatment. Performed at [facility], [place] [address] [place], [place] [address]      Labs: BNP (last 3 results) No results for input(s): BNP in the  last 8760 hours. Basic Metabolic Panel: Recent Labs  Lab 01/07/21 1426 01/08/21 0324 01/09/21 0547 01/10/21 0350 01/11/21 0150  NA 132* 132* 131* 132* 132*  K 4.5 4.0 3.6 4.1 4.0  CL 93* 96* 96* 100 98  CO2 _0 GLUCOSE 509* 403* 210* 259* 306*  BUN 25* 23* 19 21* 17  CREATININE 1.49* 1.50* 1.29* 1.54* 1.17  CALCIUM 9.1 8.9 8.4* 8.5* 8.2*  MG  --  1.9 2.0 1.9 1.8   Liver Function Tests: Recent Labs  Lab 01/08/21 0324  AST 21  ALT 20  ALKPHOS 145*  BILITOT 0.9  PROT 7.2  ALBUMIN 3.3*   No results for input(s): LIPASE, AMYLASE in the last 168 hours. No results for input(s): AMMONIA in the last 168 hours. CBC: Recent Labs  Lab 01/07/21 1426 01/08/21 0324 01/09/21 0547 01/10/21 0350 01/11/21 0150  WBC 8.7 7.7 6.5 5.7 6.4  NEUTROABS 4.0 4.3  --   --   --   HGB 15.4 13.7 13.7 13.2 12.4*  HCT 48.2 41.8 42.4 41.6 39.7  MCV 88.8 86.2 87.4 88.7 88.0  PLT 169 131* 143* 128* 126*   Cardiac Enzymes: No results for input(s): CKTOTAL, CKMB, CKMBINDEX, TROPONINI in the last 168 hours. BNP: Invalid input(s): POCBNP CBG: Recent Labs  Lab 01/10/21 1129 01/10/21 1632 01/10/21 2109 01/11/21 0620 01/11/21 1145  GLUCAP 225* 194* 292* 310* 181*   D-Dimer No results for input(s): DDIMER in the last 72 hours. Hgb A1c No results for input(s): HGBA1C in the last 72 hours. Lipid Profile Recent Labs    01/11/21 0150  CHOL 153  HDL 39*  LDLCALC 96  TRIG 91  CHOLHDL 3.9   Thyroid function studies No results for input(s): TSH, T4TOTAL, T3FREE, THYROIDAB in the last 72 hours.  Invalid  input(s): FREET3 Anemia work up No results for input(s): VITAMINB12, FOLATE, FERRITIN, TIBC, IRON, RETICCTPCT in the last 72 hours. Urinalysis    Component Value Date/Time   COLORURINE YELLOW 07/30/2019 1743   APPEARANCEUR HAZY (A) 07/30/2019 1743   LABSPEC 1.026 07/30/2019 1743   PHURINE 5.0 07/30/2019 1743   GLUCOSEU >=500 (A) 07/30/2019 1743   HGBUR LARGE (A)  07/30/2019 1743   BILIRUBINUR neg 04/11/2020 1137   KETONESUR negative 01/11/2020 0929   KETONESUR NEGATIVE 07/30/2019 1743   PROTEINUR Negative 04/11/2020 1137   PROTEINUR 100 (A) 07/30/2019 1743   UROBILINOGEN 1.0 04/11/2020 1137   NITRITE neg 04/11/2020 1137   NITRITE NEGATIVE 07/30/2019 1743   LEUKOCYTESUR Negative 04/11/2020 1137   LEUKOCYTESUR NEGATIVE 07/30/2019 1743   Sepsis Labs Invalid input(s): PROCALCITONIN,  WBC,  LACTICIDVEN Microbiology Recent Results (from the past 240 hour(s))  Resp Panel by RT-PCR (Flu A&B, Covid) Nasopharyngeal Swab     Status: None   Collection Time: 01/07/21  5:02 PM   Specimen: Nasopharyngeal Swab; Nasopharyngeal(NP) swabs in vial transport medium  Result Value Ref Range Status   SARS Coronavirus 2 by RT PCR NEGATIVE NEGATIVE Final    Comment: (NOTE) SARS-CoV-2 target nucleic acids are NOT DETECTED.  The SARS-CoV-2 RNA is generally detectable in upper respiratory specimens during the acute phase of infection. The lowest concentration of SARS-CoV-2 viral copies this assay can detect is 138 copies/mL. A negative result does not preclude SARS-Cov-2 infection and should not be used as the sole basis for treatment or other patient management decisions. A negative result may occur with  improper specimen collection/handling, submission of specimen other than nasopharyngeal swab, presence of viral mutation(s) within the areas targeted by this assay, and inadequate number of viral copies(<138 copies/mL). A negative result must be combined with clinical observations, patient history, and epidemiological information. The expected result is Negative.  Fact Sheet for Patients:  EntrepreneurPulse.com.au  Fact Sheet for Healthcare Providers:  IncredibleEmployment.be  This test is no t yet approved or cleared by the [place] FDA and  has been authorized for detection and/or diagnosis of SARS-CoV-2 by FDA  under an Emergency Use Authorization (EUA). This EUA will remain  in effect (meaning this test can be used) for the duration of the COVID-19 declaration under Section 564(b)(1) of the Act, 21 U.S.C.section 360bbb-3(b)(1), unless the authorization is terminated  or revoked sooner.       Influenza A by PCR NEGATIVE NEGATIVE Final   Influenza B by PCR NEGATIVE NEGATIVE Final    Comment: (NOTE) The Xpert Xpress SARS-CoV-2/FLU/RSV plus assay is intended as an aid in the diagnosis of influenza from Nasopharyngeal swab specimens and should not be used as a sole basis for treatment. Nasal washings and aspirates are unacceptable for Xpert Xpress SARS-CoV-2/FLU/RSV testing.  Fact Sheet for Patients: EntrepreneurPulse.com.au  Fact Sheet for Healthcare Providers: IncredibleEmployment.be  This test is not yet approved or cleared by the [place] FDA and has been authorized for detection and/or diagnosis of SARS-CoV-2 by FDA under an Emergency Use Authorization (EUA). This EUA will remain in effect (meaning this test can be used) for the duration of the COVID-19 declaration under Section 564(b)(1) of the Act, 21 U.S.C. section 360bbb-3(b)(1), unless the authorization is terminated or revoked.  Performed at [facility], [place] [address] [place], [place] [address]   Surgical PCR screen     Status: Abnormal   Collection Time: 01/10/21  5:14 AM   Specimen: Nasal Mucosa; Nasal Swab  Result  Value Ref Range Status   MRSA, PCR NEGATIVE NEGATIVE Final   Staphylococcus aureus POSITIVE (A) NEGATIVE Final    Comment: (NOTE) The Xpert SA Assay (FDA approved for NASAL specimens in patients 70 years of age and older), is one component of a comprehensive surveillance program. It is not intended to diagnose infection nor to guide or monitor treatment. Performed at [facility], [place] [address] [place], [place] [address]      Time coordinating  discharge:  I have spent 35 minutes face to face with the patient and on the ward discussing the patients care, assessment, plan and disposition with other care givers. >50% of the time was devoted counseling the patient about the risks and benefits of treatment/Discharge disposition and coordinating care.   SIGNED:   Damita Lack, MD  [facility] 01/11/2021, 12:49 PM   If 7PM-7AM, please contact night-coverage

## 2021-01-11 NOTE — Progress Notes (Signed)
Discharge instructions given to patient. PIV removed. Telemetry box removed, CCMD notified. Patient taken by wheelchair to vehicle by staff.  Daymon Larsen, RN

## 2021-01-11 NOTE — Progress Notes (Addendum)
ANTICOAGULATION CONSULT NOTE - Follow Up Consult  Pharmacy Consult for heparin Indication: new VTE  No Known Allergies  Patient Measurements: Height: 5\' 8"  (172.7 cm) Weight: (!) 175.8 kg (387 lb 9.1 oz) IBW/kg (Calculated) : 68.4 Heparin Dosing Weight: 112.6  Vital Signs: Temp: 98.3 F (36.8 C) (12/24 0812) Temp Source: Oral (12/24 0812) BP: 130/62 (12/24 0812) Pulse Rate: 71 (12/24 0812)  Labs: Recent Labs    01/09/21 0547 01/09/21 1512 01/10/21 0350 01/11/21 0150 01/11/21 0807  HGB 13.7  --  13.2 12.4*  --   HCT 42.4  --  41.6 39.7  --   PLT 143*  --  128* 126*  --   HEPARINUNFRC 0.84* 0.64 0.56  --  0.46  CREATININE 1.29*  --  1.54* 1.17  --     Estimated Creatinine Clearance: 107.1 mL/min (by C-G formula based on SCr of 1.17 mg/dL).  Assessment: 61 yo M with history of LLE DVT in Nov 2021 s/p Wallstent of left common iliac vein as well as balloon angioplasty of left common femoral vein. Patient was on apixaban Nov 2021 to April 2022. At follow up in Sept 2022, no further clot or indication to continue so patient remained off apixaban. Admitted 01/07/21 with new LLE DVT in left external iliac vein. LLE Stent patent.  Pharmacy consulted for heparin.     Heparin level therapeutic at 0.46 on heparin 1650 units/hr. CBC stable. Since the heparin level decreased slightly from yesterday and is on the lower end of the goal range, it is reasonable to empirically increase.   Goal of Therapy:  Heparin level 0.3-0.7 units/ml Monitor platelets by anticoagulation protocol: Yes   Plan:  Empirically increase to heparin 1700 units/hr Daily heparin level, CBC, and for s/sx of bleeding F/u restart apixaban   Thank you for including pharmacy in the care of this patient.  Zenaida Deed, PharmD PGY1 Acute Care Pharmacy Resident  Phone: [phone] 01/11/2021  8:54 AM  Please check [email or web address] for unit-specific pharmacy phone  numbers.  ______________________________  Addendum 01/11/2021 12:35: Pharmacy consulted to transition to apixaban. Discontinue heparin. Patient will receive apixaban 10 mg BID for 2 days (for a total of 7 days including heparin drip) and then will transition to apixaban 5 mg BID.

## 2021-01-11 NOTE — Evaluation (Signed)
Physical Therapy Evaluation Patient Details Name: Timothy Le MRN: [id number] DOB: 11-08-1960 Today's Date: 01/11/2021  History of Present Illness  Pt is a 59 y.o. male admitted 01/07/21 with LLE swelling, has been off Eliquis for 6 months. 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 S/p mechanical thrombectomy of the IVC, left common iliac vein, left external iliac vein, left common femoral vein, and left femoral vein on 12/23. PMH includes DM2, COPD, CHF, obesity, chronic respiratory failure (trach dependent).   Clinical Impression  Pt in bed upon arrival of PT, agreeable to evaluation at this time. Prior to admission the pt was mobilizing in his home with use of SPC, reports independence with all transfers and short distance gait. He relies on family and friends for errands in the community. The pt now presents with limitations in functional mobility, power, dynamic stability, and activity tolerance due to above dx, and will continue to benefit from skilled PT to address these deficits. He was able to complete bed mobility with increased time and effort, and sit-stand transfers with minG-supervision and BUE support. The pt was limited to ~75 ft hallway ambulation due to fatigue, but had no overt LOB or need for physical assist at this time. Will continue to benefit from skilled PT to progress activity tolerance, LE strengthening, and complete stair navigation prior to anticipated d/c home.         Recommendations for follow up therapy are one component of a multi-disciplinary discharge planning process, led by the attending physician.  Recommendations may be updated based on patient status, additional functional criteria and insurance authorization.  Follow Up Recommendations Home health PT    Assistance Recommended at Discharge Intermittent Supervision/Assistance  Functional Status Assessment Patient has had a recent decline in their functional status and demonstrates the ability to make  significant improvements in function in a reasonable and predictable amount of time.  Equipment Recommendations  None recommended by PT    Recommendations for Other Services       Precautions / Restrictions Precautions Precautions: Fall Restrictions Weight Bearing Restrictions: No      Mobility  Bed Mobility Overal bed mobility: Modified Independent             General bed mobility comments: increased time and effort, able to complete with use of bed features    Transfers Overall transfer level: Needs assistance Equipment used: Rolling walker (2 wheels) Transfers: Sit to/from Stand Sit to Stand: Supervision           General transfer comment: supervision for safety, good ability with RW    Ambulation/Gait Ambulation/Gait assistance: Supervision Gait Distance (Feet): 75 Feet Assistive device: Rolling walker (2 wheels) Gait Pattern/deviations: Step-to pattern;Decreased stride length;Wide base of support;Trunk flexed Gait velocity: 0.22 m/s Gait velocity interpretation: <1.31 ft/sec, indicative of household ambulator   General Gait Details: pt with slow gait, needing x2 standing rest breaks. pt with increased base of support and exertion with short distance ambulation. heavy dependence on BUE support      Balance Overall balance assessment: Needs assistance Sitting-balance support: Single extremity supported Sitting balance-Leahy Scale: Fair Sitting balance - Comments: able to reach outside BOS   Standing balance support: Bilateral upper extremity supported Standing balance-Leahy Scale: Poor Standing balance comment: dependent on BUE support for gait                             Pertinent Vitals/Pain Pain Assessment: Faces Faces Pain Scale: Hurts little more  Pain Location: LLE Pain Descriptors / Indicators: Discomfort;Operative site guarding Pain Intervention(s): Limited activity within patient's tolerance;Monitored during  session;Repositioned    Home Living Family/patient expects to be discharged to:: Private residence Living Arrangements: Alone Available Help at Discharge: Friend(s);Family;Available PRN/intermittently Type of Home: House Home Access: Stairs to enter Entrance Stairs-Rails: Psychiatric nurse of Steps: 3   Home Layout: One level Home Equipment: Conservation officer, nature (2 wheels);Cane - single point;Shower seat      Prior Function Prior Level of Function : Independent/Modified Independent             Mobility Comments: pt mobilizing with use of cane, no assist needed for ADLs, reports neice assists with running errands ADLs Comments: pt reports independent with ADLs, assist with IADLs     Hand Dominance   Dominant Hand: Right    Extremity/Trunk Assessment   Upper Extremity Assessment Upper Extremity Assessment: Overall WFL for tasks assessed;Defer to OT evaluation    Lower Extremity Assessment Lower Extremity Assessment: LLE deficits/detail LLE Deficits / Details: soreness with AROM, but able to use functionally LLE Sensation: WNL LLE Coordination: WNL    Cervical / Trunk Assessment Cervical / Trunk Assessment: Other exceptions Cervical / Trunk Exceptions: large body habitus  Communication   Communication: No difficulties  Cognition Arousal/Alertness: Awake/alert Behavior During Therapy: WFL for tasks assessed/performed Overall Cognitive Status: Within Functional Limits for tasks assessed                                 General Comments: pt with mild impulsivity, but able to follow all cues and commands. able to describe home set up and how he manages        General Comments General comments (skin integrity, edema, etc.): VSS on 5L trach collar    Exercises     Assessment/Plan    PT Assessment Patient needs continued PT services  PT Problem List Decreased strength;Decreased range of motion;Decreased activity tolerance;Decreased  balance;Decreased mobility;Obesity       PT Treatment Interventions DME instruction;Gait training;Stair training;Functional mobility training;Therapeutic activities;Therapeutic exercise;Balance training;Patient/family education    PT Goals (Current goals can be found in the Care Plan section)  Acute Rehab PT Goals Patient Stated Goal: return home PT Goal Formulation: With patient Time For Goal Achievement: 01/25/21 Potential to Achieve Goals: Good    Frequency Min 3X/week    AM-PAC PT "6 Clicks" Mobility  Outcome Measure Help needed turning from your back to your side while in a flat bed without using bedrails?: A Little Help needed moving from lying on your back to sitting on the side of a flat bed without using bedrails?: A Little Help needed moving to and from a bed to a chair (including a wheelchair)?: A Little Help needed standing up from a chair using your arms (e.g., wheelchair or bedside chair)?: A Little Help needed to walk in hospital room?: A Little Help needed climbing 3-5 steps with a railing? : A Lot 6 Click Score: 17    End of Session Equipment Utilized During Treatment: Gait belt;Oxygen Activity Tolerance: Patient tolerated treatment well Patient left: in chair;with call bell/phone within reach Nurse Communication: Mobility status PT Visit Diagnosis: Other abnormalities of gait and mobility (R26.89);Muscle weakness (generalized) (M62.81)    Time: 6578-4696 PT Time Calculation (min) (ACUTE ONLY): 36 min   Charges:   PT Evaluation $PT Eval Low Complexity: 1 Low PT Treatments $Therapeutic Exercise: 8-22 mins  [place] Carbo, PT, DPT   Acute Rehabilitation Department Pager #: [phone]  Sandra Cockayne 01/11/2021, 10:16 AM

## 2021-01-14 LAB — FACTOR 5 LEIDEN

## 2021-01-15 ENCOUNTER — Telehealth: Payer: Self-pay

## 2021-01-15 LAB — PROTHROMBIN GENE MUTATION

## 2021-01-15 NOTE — Progress Notes (Signed)
Patient arrived at office today with bolster stitch still in place. Timothy Le removed suture and expressed small amount of yellowish drainage. Site is tender to palpation. Instructed patient to wash area with mild soap and water. Patient knows to call back prior to follow up appt if further issues.

## 2021-01-15 NOTE — Telephone Encounter (Signed)
Patient calls today to see if the "staple" behind his knee can be removed. He had a venous thrombectomy on 12/23, and apparently the bolster suture is still in place. Patient says it is white plastic. I am unsure what is still there, but advised patient we could look at it and potentially take it out as an RN visit. Patient does not have a ride currently, but will call back if he is able to come to the office today.

## 2021-01-23 ENCOUNTER — Encounter: Payer: Self-pay | Admitting: Nurse Practitioner

## 2021-01-23 ENCOUNTER — Ambulatory Visit (INDEPENDENT_AMBULATORY_CARE_PROVIDER_SITE_OTHER): Payer: Medicaid Other | Admitting: Nurse Practitioner

## 2021-01-23 ENCOUNTER — Other Ambulatory Visit: Payer: Self-pay

## 2021-01-23 VITALS — BP 141/67 | HR 78 | Temp 98.1°F | Ht 68.0 in | Wt 367.8 lb

## 2021-01-23 DIAGNOSIS — Z125 Encounter for screening for malignant neoplasm of prostate: Secondary | ICD-10-CM

## 2021-01-23 DIAGNOSIS — I824Y2 Acute embolism and thrombosis of unspecified deep veins of left proximal lower extremity: Secondary | ICD-10-CM

## 2021-01-23 DIAGNOSIS — Z09 Encounter for follow-up examination after completed treatment for conditions other than malignant neoplasm: Secondary | ICD-10-CM

## 2021-01-23 DIAGNOSIS — E785 Hyperlipidemia, unspecified: Secondary | ICD-10-CM

## 2021-01-23 DIAGNOSIS — J439 Emphysema, unspecified: Secondary | ICD-10-CM

## 2021-01-23 DIAGNOSIS — I5032 Chronic diastolic (congestive) heart failure: Secondary | ICD-10-CM

## 2021-01-23 DIAGNOSIS — E1100 Type 2 diabetes mellitus with hyperosmolarity without nonketotic hyperglycemic-hyperosmolar coma (NKHHC): Secondary | ICD-10-CM

## 2021-01-23 DIAGNOSIS — R7309 Other abnormal glucose: Secondary | ICD-10-CM

## 2021-01-23 DIAGNOSIS — Z930 Tracheostomy status: Secondary | ICD-10-CM | POA: Diagnosis not present

## 2021-01-23 DIAGNOSIS — E662 Morbid (severe) obesity with alveolar hypoventilation: Secondary | ICD-10-CM

## 2021-01-23 LAB — POCT GLYCOSYLATED HEMOGLOBIN (HGB A1C)
HbA1c POC (<> result, manual entry): 11.5 % (ref 4.0–5.6)
HbA1c, POC (controlled diabetic range): 11.5 % — AB (ref 0.0–7.0)
HbA1c, POC (prediabetic range): 11.5 % — AB (ref 5.7–6.4)
Hemoglobin A1C: 11.5 % — AB (ref 4.0–5.6)

## 2021-01-23 MED ORDER — QUETIAPINE FUMARATE 100 MG PO TABS: 100.0000 mg | ORAL_TABLET | Freq: Two times a day (BID) | ORAL | 1 refills | Status: DC

## 2021-01-23 MED ORDER — TORSEMIDE 10 MG PO TABS: 10.0000 mg | ORAL_TABLET | Freq: Every day | ORAL | 1 refills | Status: DC

## 2021-01-23 MED ORDER — MOMETASONE FURO-FORMOTEROL FUM 100-5 MCG/ACT IN AERO: 2.0000 | INHALATION_SPRAY | Freq: Two times a day (BID) | RESPIRATORY_TRACT | 0 refills | Status: DC

## 2021-01-23 MED ORDER — APIXABAN 5 MG PO TABS: 5.0000 mg | ORAL_TABLET | Freq: Two times a day (BID) | ORAL | 1 refills | Status: DC

## 2021-01-23 MED ORDER — PANTOPRAZOLE SODIUM 40 MG PO TBEC: 40.0000 mg | DELAYED_RELEASE_TABLET | Freq: Every day | ORAL | 1 refills | Status: DC

## 2021-01-23 MED ORDER — LANTUS 100 UNIT/ML ~~LOC~~ SOLN: 32.0000 [IU] | Freq: Two times a day (BID) | SUBCUTANEOUS | 1 refills | Status: DC

## 2021-01-23 MED ORDER — METOPROLOL TARTRATE 25 MG PO TABS: 12.5000 mg | ORAL_TABLET | Freq: Two times a day (BID) | ORAL | 1 refills | Status: DC

## 2021-01-23 MED ORDER — GABAPENTIN 100 MG PO CAPS: 200.0000 mg | ORAL_CAPSULE | Freq: Three times a day (TID) | ORAL | 1 refills | Status: DC

## 2021-01-23 MED ORDER — ATORVASTATIN CALCIUM 10 MG PO TABS: 10.0000 mg | ORAL_TABLET | Freq: Every day | ORAL | 1 refills | Status: DC

## 2021-01-23 MED ORDER — ACETAMINOPHEN 325 MG PO TABS: 325.0000 mg | ORAL_TABLET | ORAL | 5 refills | Status: AC | PRN

## 2021-01-23 MED ORDER — ALLOPURINOL 100 MG PO TABS: 100.0000 mg | ORAL_TABLET | Freq: Every day | ORAL | 1 refills | Status: DC

## 2021-01-23 MED ORDER — POTASSIUM CHLORIDE CRYS ER 20 MEQ PO TBCR: 20.0000 meq | EXTENDED_RELEASE_TABLET | Freq: Two times a day (BID) | ORAL | 1 refills | Status: DC

## 2021-01-23 NOTE — Patient Instructions (Signed)

## 2021-01-23 NOTE — Progress Notes (Signed)
[place],   [address] Phone:  [phone]   Fax:  [phone]   Established Patient Office Visit  Subjective:  Patient ID: Timothy Le, male    DOB: 08/09/1960  Age: 61 y.o. MRN: [id number]  CC:  Chief Complaint  Patient presents with   Follow-up    3 month follow up Pt states he was in the hospital over the holidays due to leg swelling.      HPI Timothy Le presents for follow up. He  has a past medical history of Asthma, Chronic respiratory failure with hypoxia ([place]) (07/09/2017), COPD (chronic obstructive pulmonary disease) ([place]), Diabetes mellitus without complication ([place]), Difficult intubation, Hypertension, Shortness of breath dyspnea, and Sleep apnea.   He is in today for hospital follow up. He reported pain and swelling to his left leg that went up into his groin. He then went to the ED and was admitted. He had been off Eliquis for 6 months, ultrasound back in November 2022 was negative for DVT therefore not restarted but now presents back again with extensive lower extremity DVT.  Vascular team is following the patient. Underwent successful thrombectomy, Angioplasty.  Vascular recommended lifelong anticoagulation, Eliquis   He is feeling better today. He reports that his CBG continues to vary. He reports elevated values in the hospital . He reports being seen by endocrinology in the past however is no longer being followed. He reports and adjustment in his insulin therapy. On 10/21/20 his A1c was controlled at 7.4%; his current A1c >11%. His weight is down 20 pounds.  Past Medical History:  Diagnosis Date   Asthma    Chronic respiratory failure with hypoxia ([place]) 07/09/2017   COPD (chronic obstructive pulmonary disease) (HCC)    Diabetes mellitus without complication ([place])    Difficult intubation    Hypertension    Shortness of breath dyspnea    Sleep apnea     Past Surgical History:  Procedure Laterality  Date   INTRAVASCULAR ULTRASOUND/IVUS Left 12/11/2019   Procedure: Intravascular Ultrasound/IVUS;  Surgeon: Waynetta Sandy, MD;  Location: [facility];  Service: Cardiovascular;  Laterality: Left;  Left lower extremity venous   IR GASTROSTOMY TUBE MOD SED  07/05/2017   IVC VENOGRAPHY N/A 01/10/2021   Procedure: IVC VENOGRAPHY - VENA CAVA;  Surgeon: Angelia Mould, MD;  Location: [facility];  Service: Cardiovascular;  Laterality: N/A;   LOWER EXTREMITY VENOGRAPHY N/A 01/10/2021   Procedure: LOWER EXTREMITY VENOGRAPHY;  Surgeon: Angelia Mould, MD;  Location: [facility];  Service: Cardiovascular;  Laterality: N/A;   PERIPHERAL VASCULAR THROMBECTOMY Left 12/11/2019   Procedure: PERIPHERAL VASCULAR THROMBECTOMY;  Surgeon: Waynetta Sandy, MD;  Location: [facility];  Service: Cardiovascular;  Laterality: Left;   THYROIDECTOMY Left 08/30/2019   Procedure: THYROIDECTOMY CERVICAL LIPECTOMY;  Surgeon: Izora Gala, MD;  Location: [facility];  Service: ENT;  Laterality: Left;  NEEDS RNFA PLEASE   TRACHEOSTOMY TUBE PLACEMENT N/A 06/17/2017   Procedure: TRACHEOSTOMY;  Surgeon: Leta Baptist, MD;  Location: [facility];  Service: ENT;  Laterality: N/A;   TRACHEOSTOMY TUBE PLACEMENT N/A 08/14/2019   Procedure: TRACHEOSTOMY;  Surgeon: Izora Gala, MD;  Location: [facility];  Service: ENT;  Laterality: N/A;   TRACHEOSTOMY TUBE PLACEMENT N/A 08/30/2019   Procedure: TRACHEOSTOMY  REVISION;  Surgeon: Izora Gala, MD;  Location: [facility];  Service: ENT;  Laterality: N/A;    Family History  Problem Relation Age of Onset   Asthma Sister    Asthma Other        nephew   Hypertension Sister    Diabetes type II Sister     Social History   Socioeconomic History   Marital status: Legally Separated    Spouse name: Not on file   Number of children: 1   Years of education: Not on file   Highest education level: Not on file  Occupational History   Occupation: unemployed     Comment: working on disablity  Tobacco Use   Smoking status: Former    Packs/day: 0.50    Years: 0.50    Pack years: 0.25    Types: Cigarettes    Start date: 07/28/1983    Quit date: 02/27/2013    Years since quitting: 7.9   Smokeless tobacco: Never  Vaping Use   Vaping Use: Never used  Substance and Sexual Activity   Alcohol use: No    Comment: quit ETOH about 5 months ago-beer and liquor   Drug use: No    Comment: quit 6 months-"pot"-smoked couple joints a week   Sexual activity: Not Currently  Other Topics Concern   Not on file  Social History Narrative   Not on file   Social Determinants of Health   Financial Resource Strain: Not on file  Food Insecurity: Not on file  Transportation Needs: Not on file  Physical Activity: Not on file  Stress: Not on file  Social Connections: Not on file  Intimate Partner Violence: Not on file    Outpatient Medications Prior to Visit  Medication Sig Dispense Refill   apixaban (ELIQUIS) 5 MG TABS tablet Take 2 tablets (10 mg total) by mouth 2 (two) times daily for 2 days, THEN 1 tablet (5 mg total) 2 (two) times daily for 28 days. 64 tablet 0   BD INSULIN SYRINGE U/F 31G X 5/16" 0.5 ML MISC USE FOR DIABETIC TESTING     blood glucose meter kit and supplies Dispense based on patient and insurance preference. Use up to four times daily as directed. (FOR ICD-10 E10.9, E11.9). 1 each 0   diclofenac Sodium (VOLTAREN) 1 % GEL Apply 2 g topically 4 (four) times daily. (Patient taking differently: Apply 2 g topically 4 (four) times daily as needed (arthritis).) 150 g 5   Insulin Syringes, Disposable, U-100 0.5 ML MISC 1 Syringe by Does not apply route 3 (three) times daily after meals. 100 each 11   oxyCODONE (OXY IR/ROXICODONE) 5 MG immediate release tablet Take 1 tablet (5 mg total) by mouth every 6 (six) hours as needed for moderate pain or severe pain. 15 tablet 0   Phenylephrine-DM-GG (MUCINEX FAST-MAX CONGEST COUGH) 2.5-5-100 MG/5ML LIQD Take  20 mLs by mouth daily as needed (cough and chest congestion).     senna-docusate (SENOKOT-S) 8.6-50 MG tablet Take 2 tablets by mouth at bedtime as needed for moderate constipation. 60 tablet 0   acetaminophen (TYLENOL) 325 MG tablet Take 1-2 tablets (325-650 mg total) by mouth every 4 (four) hours as needed for mild pain.     allopurinol (ZYLOPRIM) 100 MG tablet Take 1 tablet (100 mg total) by mouth daily. 90 tablet 3   atorvastatin (LIPITOR) 10 MG tablet Take 1 tablet (10 mg total) by mouth daily. 90 tablet 3   gabapentin (NEURONTIN) 100 MG capsule Take 2 capsules (200 mg total) by mouth 3 (three) times daily. 540 capsule 3   LANTUS 100 UNIT/ML injection Inject 0.32  mLs (32 Units total) into the skin 2 (two) times daily. 57.6 mL 3   metoprolol tartrate (LOPRESSOR) 25 MG tablet Take 0.5 tablets (12.5 mg total) by mouth 2 (two) times daily. 90 tablet 3   pantoprazole (PROTONIX) 40 MG tablet Take 1 tablet (40 mg total) by mouth at bedtime. 90 tablet 3   potassium chloride SA (KLOR-CON) 20 MEQ tablet Take 1 tablet (20 mEq total) by mouth 2 (two) times daily. 180 tablet 3   QUEtiapine (SEROQUEL) 100 MG tablet Take 1 tablet (100 mg total) by mouth 2 (two) times daily. 180 tablet 3   torsemide (DEMADEX) 10 MG tablet Take 1 tablet (10 mg total) by mouth daily. 90 tablet 3   mometasone-formoterol (DULERA) 100-5 MCG/ACT AERO Inhale 2 puffs into the lungs 2 (two) times daily for 8 days. 8 g 2   No facility-administered medications prior to visit.    No Known Allergies  ROS Review of Systems    Objective:    Physical Exam Constitutional:      Appearance: He is obese.  HENT:     Head: Normocephalic and atraumatic.  Cardiovascular:     Rate and Rhythm: Normal rate and regular rhythm.     Pulses: Normal pulses.     Heart sounds: Normal heart sounds.  Pulmonary:     Effort: Pulmonary effort is normal.     Breath sounds: Normal breath sounds.     Comments: Trach Trach collar changed without  difficulty  Musculoskeletal:        General: Swelling present.     Cervical back: Normal range of motion.     Left lower leg: No edema.  Skin:    General: Skin is warm and dry.     Capillary Refill: Capillary refill takes less than 2 seconds.  Neurological:     General: No focal deficit present.     Mental Status: He is alert and oriented to person, place, and time.  Psychiatric:        Mood and Affect: Mood normal.        Behavior: Behavior normal.        Thought Content: Thought content normal.        Judgment: Judgment normal.    BP (!) 141/67    Pulse 78    Temp 98.1 F (36.7 C)    Ht 5' 8" (1.727 m)    Wt (!) 367 lb 12.8 oz (166.8 kg)    SpO2 92%    BMI 55.92 kg/m  Wt Readings from Last 3 Encounters:  01/23/21 (!) 367 lb 12.8 oz (166.8 kg)  01/08/21 (!) 387 lb 9.1 oz (175.8 kg)  10/21/20 (!) 387 lb 9.6 oz (175.8 kg)     Health Maintenance Due  Topic Date Due   COVID-19 Vaccine (1) Never done   Pneumococcal Vaccine 72-65 Years old (1 - PCV) 09/10/1967   OPHTHALMOLOGY EXAM  Never done   Zoster Vaccines- Shingrix (1 of 2) Never done   FOOT EXAM  09/26/2020    There are no preventive care reminders to display for this patient.  Lab Results  Component Value Date   TSH 0.434 08/07/2019   Lab Results  Component Value Date   WBC 6.4 01/11/2021   HGB 12.4 (L) 01/11/2021   HCT 39.7 01/11/2021   MCV 88.0 01/11/2021   PLT 126 (L) 01/11/2021   Lab Results  Component Value Date   NA 132 (L) 01/11/2021   K 4.0 01/11/2021  CO2 27 01/11/2021   GLUCOSE 306 (H) 01/11/2021   BUN 17 01/11/2021   CREATININE 1.17 01/11/2021   BILITOT 0.9 01/08/2021   ALKPHOS 145 (H) 01/08/2021   AST 21 01/08/2021   ALT 20 01/08/2021   PROT 7.2 01/08/2021   ALBUMIN 3.3 (L) 01/08/2021   CALCIUM 8.2 (L) 01/11/2021   ANIONGAP 7 01/11/2021   EGFR 72 04/11/2020   GFR 85.97 01/22/2016   Lab Results  Component Value Date   CHOL 153 01/11/2021   Lab Results  Component Value Date    HDL 39 (L) 01/11/2021   Lab Results  Component Value Date   LDLCALC 96 01/11/2021   Lab Results  Component Value Date   TRIG 91 01/11/2021   Lab Results  Component Value Date   CHOLHDL 3.9 01/11/2021   Lab Results  Component Value Date   HGBA1C 11.0 (H) 01/08/2021      Assessment & Plan:   Problem List Items Addressed This Visit       Respiratory   COPD (chronic obstructive pulmonary disease) ([place]) Persistent Continue with current regimen and follow up with pulmonology    Relevant Medications   mometasone-formoterol (DULERA) 100-5 MCG/ACT AERO     Endocrine   Type 2 diabetes mellitus with hyperosmolar nonketotic hyperglycemia (HCC) - Primary Worsening A1c >11% up from 7.4% at last OV.  Referral to endocrinology for reevaluaton of treatment and ongoing education      Relevant Medications   atorvastatin (LIPITOR) 10 MG tablet   LANTUS 100 UNIT/ML injection   Other Relevant Orders   HgB A1c   Ambulatory referral to Endocrinology   Other Visit Diagnoses     Tracheostomy status ([facility])   (Chronic)   Patent Continue to follow up with trach care clinic as scheduled   Chronic diastolic (congestive) heart failure (HCC)   (Chronic)   Stable Low sodium, heart healthy, diabetic diet Daily weight Encouraged on going compliance with current medication regimen Recommend weight loss   Relevant Medications   atorvastatin (LIPITOR) 10 MG tablet   metoprolol tartrate (LOPRESSOR) 25 MG tablet   torsemide (DEMADEX) 10 MG tablet   Acute embolism and thrombosis of unspecified deep veins of left proximal lower extremity (HCC)    Eliquis updated  Continue to follow up with vascular as scheduled. (Chronic)     Relevant Medications   atorvastatin (LIPITOR) 10 MG tablet   metoprolol tartrate (LOPRESSOR) 25 MG tablet   torsemide (DEMADEX) 10 MG tablet   Morbid (severe) obesity with alveolar hypoventilation (HCC)   (Chronic)     Relevant Medications   LANTUS 100 UNIT/ML injection    Hemoglobin A1c greater than 9.0%       Relevant Orders   Ambulatory referral to Endocrinology       Meds ordered this encounter  Medications   acetaminophen (TYLENOL) 325 MG tablet    Sig: Take 1-2 tablets (325-650 mg total) by mouth every 4 (four) hours as needed for mild pain.    Dispense:  60 tablet    Refill:  5   allopurinol (ZYLOPRIM) 100 MG tablet    Sig: Take 1 tablet (100 mg total) by mouth daily.    Dispense:  90 tablet    Refill:  1   atorvastatin (LIPITOR) 10 MG tablet    Sig: Take 1 tablet (10 mg total) by mouth daily.    Dispense:  90 tablet    Refill:  1   LANTUS 100 UNIT/ML injection    Sig:  Inject 0.32 mLs (32 Units total) into the skin 2 (two) times daily.    Dispense:  57.6 mL    Refill:  1   gabapentin (NEURONTIN) 100 MG capsule    Sig: Take 2 capsules (200 mg total) by mouth 3 (three) times daily.    Dispense:  540 capsule    Refill:  1   metoprolol tartrate (LOPRESSOR) 25 MG tablet    Sig: Take 0.5 tablets (12.5 mg total) by mouth 2 (two) times daily.    Dispense:  90 tablet    Refill:  1   mometasone-formoterol (DULERA) 100-5 MCG/ACT AERO    Sig: Inhale 2 puffs into the lungs 2 (two) times daily.    Dispense:  8.8 g    Refill:  0   pantoprazole (PROTONIX) 40 MG tablet    Sig: Take 1 tablet (40 mg total) by mouth at bedtime.    Dispense:  90 tablet    Refill:  1   potassium chloride SA (KLOR-CON M) 20 MEQ tablet    Sig: Take 1 tablet (20 mEq total) by mouth 2 (two) times daily.    Dispense:  180 tablet    Refill:  1   QUEtiapine (SEROQUEL) 100 MG tablet    Sig: Take 1 tablet (100 mg total) by mouth 2 (two) times daily.    Dispense:  180 tablet    Refill:  1   torsemide (DEMADEX) 10 MG tablet    Sig: Take 1 tablet (10 mg total) by mouth daily.    Dispense:  90 tablet    Refill:  1    Follow-up: No follow-ups on file.    Vevelyn Francois, NP

## 2021-01-24 LAB — CBC WITH DIFFERENTIAL/PLATELET
Basophils Absolute: 0.1 10*3/uL (ref 0.0–0.2)
Basos: 1 %
EOS (ABSOLUTE): 0.2 10*3/uL (ref 0.0–0.4)
Eos: 2 %
Hematocrit: 46.6 % (ref 37.5–51.0)
Hemoglobin: 14.8 g/dL (ref 13.0–17.7)
Immature Grans (Abs): 0.1 10*3/uL (ref 0.0–0.1)
Immature Granulocytes: 1 %
Lymphocytes Absolute: 2.5 10*3/uL (ref 0.7–3.1)
Lymphs: 35 %
MCH: 27.5 pg (ref 26.6–33.0)
MCHC: 31.8 g/dL (ref 31.5–35.7)
MCV: 87 fL (ref 79–97)
Monocytes Absolute: 0.5 10*3/uL (ref 0.1–0.9)
Monocytes: 7 %
Neutrophils Absolute: 4 10*3/uL (ref 1.4–7.0)
Neutrophils: 54 %
Platelets: 254 10*3/uL (ref 150–450)
RBC: 5.39 x10E6/uL (ref 4.14–5.80)
RDW: 14.9 % (ref 11.6–15.4)
WBC: 7.3 10*3/uL (ref 3.4–10.8)

## 2021-01-24 LAB — COMP. METABOLIC PANEL (12)
AST: 26 IU/L (ref 0–40)
Albumin/Globulin Ratio: 1 — ABNORMAL LOW (ref 1.2–2.2)
Albumin: 4.2 g/dL (ref 3.8–4.9)
Alkaline Phosphatase: 145 IU/L — ABNORMAL HIGH (ref 44–121)
BUN/Creatinine Ratio: 17 (ref 9–20)
BUN: 24 mg/dL (ref 6–24)
Bilirubin Total: 0.4 mg/dL (ref 0.0–1.2)
Calcium: 9.7 mg/dL (ref 8.7–10.2)
Chloride: 98 mmol/L (ref 96–106)
Creatinine, Ser: 1.41 mg/dL — ABNORMAL HIGH (ref 0.76–1.27)
Globulin, Total: 4.3 g/dL (ref 1.5–4.5)
Glucose: 187 mg/dL — ABNORMAL HIGH (ref 70–99)
Potassium: 4.5 mmol/L (ref 3.5–5.2)
Sodium: 140 mmol/L (ref 134–144)
Total Protein: 8.5 g/dL (ref 6.0–8.5)
eGFR: 57 mL/min/{1.73_m2} — ABNORMAL LOW (ref >59)

## 2021-01-24 LAB — PSA: Prostate Specific Ag, Serum: 1.2 ng/mL (ref 0.0–4.0)

## 2021-01-29 ENCOUNTER — Telehealth: Payer: Self-pay

## 2021-01-29 NOTE — Telephone Encounter (Signed)
-----   Message from Vevelyn Francois, NP sent at 01/25/2021  8:09 AM EST ----- All labs stable no additional changes to care plan at this time

## 2021-01-29 NOTE — Telephone Encounter (Signed)
Pt was called and is aware of results, DOB was confirmed.  ?

## 2021-02-10 ENCOUNTER — Other Ambulatory Visit: Payer: Self-pay

## 2021-02-10 DIAGNOSIS — I824Y9 Acute embolism and thrombosis of unspecified deep veins of unspecified proximal lower extremity: Secondary | ICD-10-CM

## 2021-02-19 ENCOUNTER — Other Ambulatory Visit: Payer: Self-pay

## 2021-02-19 ENCOUNTER — Ambulatory Visit: Payer: Medicaid Other | Admitting: Podiatry

## 2021-02-19 ENCOUNTER — Encounter: Payer: Self-pay | Admitting: Podiatry

## 2021-02-19 DIAGNOSIS — M79674 Pain in right toe(s): Secondary | ICD-10-CM

## 2021-02-19 DIAGNOSIS — Z794 Long term (current) use of insulin: Secondary | ICD-10-CM

## 2021-02-19 DIAGNOSIS — B351 Tinea unguium: Secondary | ICD-10-CM | POA: Diagnosis not present

## 2021-02-19 DIAGNOSIS — M79675 Pain in left toe(s): Secondary | ICD-10-CM

## 2021-02-19 DIAGNOSIS — N179 Acute kidney failure, unspecified: Secondary | ICD-10-CM

## 2021-02-19 DIAGNOSIS — E1165 Type 2 diabetes mellitus with hyperglycemia: Secondary | ICD-10-CM | POA: Diagnosis not present

## 2021-02-19 NOTE — Progress Notes (Signed)
This patient returns to my office for at risk foot care.  This patient requires this care by a professional since this patient will be at risk due to having diabetes  This patient is unable to cut nails himself since the patient cannot reach his nails.These nails are painful walking and wearing shoes.  This patient presents for at risk foot care today.  General Appearance  Alert, conversant and in no acute stress.  Vascular  Dorsalis pedis and posterior tibial  pulses are  not   bilaterally due to severe swelling..  Capillary return is within normal limits  bilaterally. Temperature is within normal limits  bilaterally.  Neurologic  Senn-Weinstein monofilament wire test within normal limits  bilaterally. Muscle power within normal limits bilaterally.  Nails Thick disfigured discolored nails with subungual debris  from hallux to fifth toes bilaterally. No evidence of bacterial infection or drainage bilaterally.  Orthopedic  No limitations of motion  feet .  No crepitus or effusions noted.  No bony pathology or digital deformities noted.  HAV  B/L.  Skin  normotropic skin with no porokeratosis noted bilaterally.  No signs of infections or ulcers noted.   Plantar callus along weight bearing areas both feet.  Onychomycosis  Pain in right toes  Pain in left toes  Consent was obtained for treatment procedures.   Mechanical debridement of nails 1-5  bilaterally performed with a nail nipper.  Filed with dremel without incident.    Return office visit      3 months              Told patient to return for periodic foot care and evaluation due to potential at risk complications.   Ruthellen Tippy DPM  

## 2021-02-20 ENCOUNTER — Inpatient Hospital Stay (HOSPITAL_COMMUNITY): Admission: RE | Admit: 2021-02-20 | Payer: Medicaid Other | Source: Ambulatory Visit

## 2021-02-20 NOTE — Progress Notes (Deleted)
[facility]     History of Present Illness  Timothy Le is a 61 y.o. male who presents with chief complaint: recurrent left LE DVT with edema.  He had previous  mechanical thrombectomy and angioplasty and stenting of the left common and external iliac veins with a 71m Wallstent.  He was off his anticoagulation.  He states he ran out of Eliquis 4 months prior.  He was placed on ASA daily.    DVT study performed on 01/07/21 revealed repeat acute DVT involving the left  common femoral vein, SF junction, left femoral vein, left proximal profunda vein, and left popliteal vein.  He was scheduled for left LE venogram.  He is here today for f/u s/p Mechanical thrombectomy of IVC, Left CIV, EIV, CFV, FV and  Angioplasty and stenting of Left EIV, CFV. He is here today for f/u with IVC/ Iliac and LLE venous duplex.  Past Medical History:  Diagnosis Date   Asthma    Chronic respiratory failure with hypoxia ([place] 07/09/2017   COPD (chronic obstructive pulmonary disease) ([facility])    Diabetes mellitus without complication ([place]    Difficult intubation    Hypertension    Shortness of breath dyspnea    Sleep apnea     Past Surgical History:  Procedure Laterality Date   INTRAVASCULAR ULTRASOUND/IVUS Left 12/11/2019   Procedure: Intravascular Ultrasound/IVUS;  Surgeon: CWaynetta Sandy MD;  [facility];  Service: Cardiovascular;  Laterality: Left;  Left lower extremity venous   IR GASTROSTOMY TUBE MOD SED  07/05/2017   IVC VENOGRAPHY N/A 01/10/2021   Procedure: IVC VENOGRAPHY - VENA CAVA;  Surgeon: DAngelia Mould MD;  [facility];  Service: Cardiovascular;  Laterality: N/A;   LOWER EXTREMITY VENOGRAPHY N/A 01/10/2021   Procedure: LOWER EXTREMITY VENOGRAPHY;  Surgeon: DAngelia Mould MD;  [facility];  Service: Cardiovascular;  Laterality: N/A;   PERIPHERAL VASCULAR THROMBECTOMY Left 12/11/2019    Procedure: PERIPHERAL VASCULAR THROMBECTOMY;  Surgeon: CWaynetta Sandy MD;  [facility];  Service: Cardiovascular;  Laterality: Left;   THYROIDECTOMY Left 08/30/2019   Procedure: THYROIDECTOMY CERVICAL LIPECTOMY;  Surgeon: RIzora Gala MD;  [facility]  Service: ENT;  Laterality: Left;  NEEDS RNFA PLEASE   TRACHEOSTOMY TUBE PLACEMENT N/A 06/17/2017   Procedure: TRACHEOSTOMY;  Surgeon: TLeta Baptist MD;  [facility]  Service: ENT;  Laterality: N/A;   TRACHEOSTOMY TUBE PLACEMENT N/A 08/14/2019   Procedure: TRACHEOSTOMY;  Surgeon: RIzora Gala MD;  [place] [facility]  Service: ENT;  Laterality: N/A;   TRACHEOSTOMY TUBE PLACEMENT N/A 08/30/2019   Procedure: TRACHEOSTOMY  REVISION;  Surgeon: RIzora Gala MD;  [place] [facility]  Service: ENT;  Laterality: N/A;    Social History   Socioeconomic History   Marital status: Legally Separated    Spouse name: Not on file   Number of children: 1   Years of education: Not on file   Highest education level: Not on file  Occupational History   Occupation: unemployed    Comment: working on disablity  Tobacco Use   Smoking status: Former    Packs/day: 0.50    Years: 0.50    Pack years: 0.25    Types: Cigarettes    Start date: 07/28/1983    Quit date: 02/27/2013    Years since quitting: 7.9   Smokeless tobacco: Never  Vaping Use   Vaping Use: Never used  Substance and  Sexual Activity   Alcohol use: No    Comment: quit ETOH about 5 months ago-beer and liquor   Drug use: No    Comment: quit 6 months-"pot"-smoked couple joints a week   Sexual activity: Not Currently  Other Topics Concern   Not on file  Social History Narrative   Not on file   Social Determinants of Health   Financial Resource Strain: Not on file  Food Insecurity: Not on file  Transportation Needs: Not on file  Physical Activity: Not on file  Stress: Not on file  Social Connections: Not on file  Intimate Partner Violence: Not on file     Family History  Problem Relation Age of Onset   Asthma Sister    Asthma Other        nephew   Hypertension Sister    Diabetes type II Sister     Current Outpatient Medications on File Prior to Visit  Medication Sig Dispense Refill   acetaminophen (TYLENOL) 325 MG tablet Take 1-2 tablets (325-650 mg total) by mouth every 4 (four) hours as needed for mild pain. 60 tablet 5   allopurinol (ZYLOPRIM) 100 MG tablet Take 1 tablet (100 mg total) by mouth daily. 90 tablet 1   apixaban (ELIQUIS) 5 MG TABS tablet Take 1 tablet (5 mg total) by mouth 2 (two) times daily. 180 tablet 1   atorvastatin (LIPITOR) 10 MG tablet Take 1 tablet (10 mg total) by mouth daily. 90 tablet 1   BD INSULIN SYRINGE U/F 31G X 5/16" 0.5 ML MISC USE FOR DIABETIC TESTING     blood glucose meter kit and supplies Dispense based on patient and insurance preference. Use up to four times daily as directed. (FOR ICD-10 E10.9, E11.9). 1 each 0   diclofenac Sodium (VOLTAREN) 1 % GEL Apply 2 g topically 4 (four) times daily. (Patient taking differently: Apply 2 g topically 4 (four) times daily as needed (arthritis).) 150 g 5   gabapentin (NEURONTIN) 100 MG capsule Take 2 capsules (200 mg total) by mouth 3 (three) times daily. 540 capsule 1   Insulin Syringes, Disposable, U-100 0.5 ML MISC 1 Syringe by Does not apply route 3 (three) times daily after meals. 100 each 11   LANTUS 100 UNIT/ML injection Inject 0.32 mLs (32 Units total) into the skin 2 (two) times daily. 57.6 mL 1   metoprolol tartrate (LOPRESSOR) 25 MG tablet Take 0.5 tablets (12.5 mg total) by mouth 2 (two) times daily. 90 tablet 1   mometasone-formoterol (DULERA) 100-5 MCG/ACT AERO Inhale 2 puffs into the lungs 2 (two) times daily. 8.8 g 0   oxyCODONE (OXY IR/ROXICODONE) 5 MG immediate release tablet Take 1 tablet (5 mg total) by mouth every 6 (six) hours as needed for moderate pain or severe pain. 15 tablet 0   pantoprazole (PROTONIX) 40 MG tablet Take 1 tablet (40  mg total) by mouth at bedtime. 90 tablet 1   Phenylephrine-DM-GG (MUCINEX FAST-MAX CONGEST COUGH) 2.5-5-100 MG/5ML LIQD Take 20 mLs by mouth daily as needed (cough and chest congestion).     potassium chloride SA (KLOR-CON M) 20 MEQ tablet Take 1 tablet (20 mEq total) by mouth 2 (two) times daily. 180 tablet 1   QUEtiapine (SEROQUEL) 100 MG tablet Take 1 tablet (100 mg total) by mouth 2 (two) times daily. 180 tablet 1   senna-docusate (SENOKOT-S) 8.6-50 MG tablet Take 2 tablets by mouth at bedtime as needed for moderate constipation. 60 tablet 0   torsemide (DEMADEX) 10 MG  tablet Take 1 tablet (10 mg total) by mouth daily. 90 tablet 1   No current facility-administered medications on file prior to visit.    Allergies as of 02/20/2021   (No Known Allergies)     ROS:   General:  No weight loss, Fever, chills  HEENT: No recent headaches, no nasal bleeding, no visual changes, no sore throat  Neurologic: No dizziness, blackouts, seizures. No recent symptoms of stroke or mini- stroke. No recent episodes of slurred speech, or temporary blindness.  Cardiac: No recent episodes of chest pain/pressure, no shortness of breath at rest.  No shortness of breath with exertion.  Denies history of atrial fibrillation or irregular heartbeat  Vascular: No history of rest pain in feet.  No history of claudication.  No history of non-healing ulcer, No history of DVT   Pulmonary: No home oxygen, no productive cough, no hemoptysis,  No asthma or wheezing  Musculoskeletal:  '[ ]'  Arthritis, '[ ]'  Low back pain,  '[ ]'  Joint pain  Hematologic:No history of hypercoagulable state.  No history of easy bleeding.  No history of anemia  Gastrointestinal: No hematochezia or melena,  No gastroesophageal reflux, no trouble swallowing  Urinary: '[ ]'  chronic Kidney disease, '[ ]'  on HD - '[ ]'  MWF or '[ ]'  TTHS, '[ ]'  Burning with urination, '[ ]'  Frequent urination, '[ ]'  Difficulty urinating;   Skin: No rashes  Psychological: No  history of anxiety,  No history of depression  Physical Examination  There were no vitals filed for this visit.  There is no height or weight on file to calculate BMI.  General:  Alert and oriented, no acute distress HEENT: Normal Neck: No bruit or JVD Pulmonary: Clear to auscultation bilaterally Cardiac: Regular Rate and Rhythm without murmur Abdomen: Soft, non-tender, non-distended, no mass, no scars Skin: No rash Extremity Pulses:  2+ radial, brachial, femoral, dorsalis pedis, posterior tibial pulses bilaterally Musculoskeletal: No deformity or edema  Neurologic: Upper and lower extremity motor 5/5 and symmetric  DATA:  Assessment:  Plan:  Roxy Horseman PA-C [facility] Office: [phone]  MD in office [place]

## 2021-02-27 ENCOUNTER — Ambulatory Visit (INDEPENDENT_AMBULATORY_CARE_PROVIDER_SITE_OTHER): Payer: Medicaid Other | Admitting: Physician Assistant

## 2021-02-27 ENCOUNTER — Other Ambulatory Visit: Payer: Self-pay

## 2021-02-27 ENCOUNTER — Ambulatory Visit (HOSPITAL_COMMUNITY)
Admission: RE | Admit: 2021-02-27 | Discharge: 2021-02-27 | Disposition: A | Discharge status: Home / Self Care | Payer: Medicaid Other | Source: Ambulatory Visit | Attending: Vascular Surgery | Admitting: Vascular Surgery

## 2021-02-27 VITALS — BP 135/79 | HR 81 | Temp 98.3°F | Resp 20 | Ht 68.0 in | Wt 388.9 lb

## 2021-02-27 DIAGNOSIS — I824Y9 Acute embolism and thrombosis of unspecified deep veins of unspecified proximal lower extremity: Secondary | ICD-10-CM | POA: Diagnosis not present

## 2021-02-27 NOTE — Progress Notes (Signed)
VASCULAR & VEIN SPECIALISTS           OF [place]  History and Physical   Timothy Le is a 61 y.o. male who presents was originally seen in 2021 by Vascular Surgery for LLE swelling and was found to have a DVT involving the left common femoral vein, SFJ, femoral vein, proximal profunda femoral vein, left popliteal and posterior tibial veins. The other tibial veins were not well visualized and may be involved as well.     On 12/11/2019, he underwent Mechanical thrombectomy of IVC, left common external leg veins, left common femoral and femoral veins with Inari Clottriever, Stent of left common iliac vein with 16 x 90 mm Wallstent,  Balloon angioplasty of left common femoral and femoral vein with 8 mm balloon by Dr. Donzetta Matters.  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  He underwent Mechanical thrombectomy of IVC, Left CIV, EIV, CFV, FV, Angioplasty and stenting of Left EIV, CFV on 01/10/2021 by Dr. Scot Dock.    He comes back in today for ultrasound.  He states he is compliant with his Eliquis.  He states that he does wear his compression and elevates his legs when he can.   He states his left leg swelling is much better.  He doesn't really have problems with the right leg.    He has past medical history significant for morbid obesity, obstructive sleep apnea, CHF, COPD and chronic respiratory failure with trach.  The pt is on a statin for cholesterol management.  The pt is not on a daily aspirin.   Other AC:  Eliquis The pt is on BB for hypertension.   The pt is diabetic.   Tobacco hx:  former   Past Medical History:  Diagnosis Date   Asthma    Chronic respiratory failure with hypoxia ([place]) 07/09/2017   COPD (chronic obstructive pulmonary disease) (HCC)    Diabetes mellitus without complication ([place])    Difficult intubation    Hypertension    Shortness of breath dyspnea    Sleep apnea     Past Surgical History:  Procedure Laterality Date    INTRAVASCULAR ULTRASOUND/IVUS Left 12/11/2019   Procedure: Intravascular Ultrasound/IVUS;  Surgeon: Waynetta Sandy, MD;  Location: [place] [facility];  Service: Cardiovascular;  Laterality: Left;  Left lower extremity venous   IR GASTROSTOMY TUBE MOD SED  07/05/2017   IVC VENOGRAPHY N/A 01/10/2021   Procedure: IVC VENOGRAPHY - VENA CAVA;  Surgeon: Angelia Mould, MD;  Location: [facility];  Service: Cardiovascular;  Laterality: N/A;   LOWER EXTREMITY VENOGRAPHY N/A 01/10/2021   Procedure: LOWER EXTREMITY VENOGRAPHY;  Surgeon: Angelia Mould, MD;  Location: [place] [facility];  Service: Cardiovascular;  Laterality: N/A;   PERIPHERAL VASCULAR THROMBECTOMY Left 12/11/2019   Procedure: PERIPHERAL VASCULAR THROMBECTOMY;  Surgeon: Waynetta Sandy, MD;  Location: [place] [facility];  Service: Cardiovascular;  Laterality: Left;   THYROIDECTOMY Left 08/30/2019   Procedure: THYROIDECTOMY CERVICAL LIPECTOMY;  Surgeon: Izora Gala, MD;  Location: [place];  Service: ENT;  Laterality: Left;  NEEDS RNFA PLEASE   TRACHEOSTOMY TUBE PLACEMENT N/A 06/17/2017   Procedure: TRACHEOSTOMY;  Surgeon: Leta Baptist, MD;  Location: [facility];  Service: ENT;  Laterality: N/A;   TRACHEOSTOMY TUBE PLACEMENT N/A 08/14/2019   Procedure: TRACHEOSTOMY;  Surgeon: Izora Gala, MD;  Location: [place];  Service: ENT;  Laterality: N/A;   TRACHEOSTOMY TUBE PLACEMENT N/A  08/30/2019   Procedure: TRACHEOSTOMY  REVISION;  Surgeon: Izora Gala, MD;  Location: [facility];  Service: ENT;  Laterality: N/A;    Social History   Socioeconomic History   Marital status: Legally Separated    Spouse name: Not on file   Number of children: 1   Years of education: Not on file   Highest education level: Not on file  Occupational History   Occupation: unemployed    Comment: working on disablity  Tobacco Use   Smoking status: Former    Packs/day: 0.50    Years: 0.50    Pack years: 0.25    Types: Cigarettes    Start  date: 07/28/1983    Quit date: 02/27/2013    Years since quitting: 8.0   Smokeless tobacco: Never  Vaping Use   Vaping Use: Never used  Substance and Sexual Activity   Alcohol use: No    Comment: quit ETOH about 5 months ago-beer and liquor   Drug use: No    Comment: quit 6 months-"pot"-smoked couple joints a week   Sexual activity: Not Currently  Other Topics Concern   Not on file  Social History Narrative   Not on file   Social Determinants of Health   Financial Resource Strain: Not on file  Food Insecurity: Not on file  Transportation Needs: Not on file  Physical Activity: Not on file  Stress: Not on file  Social Connections: Not on file  Intimate Partner Violence: Not on file     Family History  Problem Relation Age of Onset   Asthma Sister    Asthma Other        nephew   Hypertension Sister    Diabetes type II Sister     Current Outpatient Medications  Medication Sig Dispense Refill   acetaminophen (TYLENOL) 325 MG tablet Take 1-2 tablets (325-650 mg total) by mouth every 4 (four) hours as needed for mild pain. 60 tablet 5   allopurinol (ZYLOPRIM) 100 MG tablet Take 1 tablet (100 mg total) by mouth daily. 90 tablet 1   apixaban (ELIQUIS) 5 MG TABS tablet Take 1 tablet (5 mg total) by mouth 2 (two) times daily. 180 tablet 1   atorvastatin (LIPITOR) 10 MG tablet Take 1 tablet (10 mg total) by mouth daily. 90 tablet 1   BD INSULIN SYRINGE U/F 31G X 5/16" 0.5 ML MISC USE FOR DIABETIC TESTING     blood glucose meter kit and supplies Dispense based on patient and insurance preference. Use up to four times daily as directed. (FOR ICD-10 E10.9, E11.9). 1 each 0   diclofenac Sodium (VOLTAREN) 1 % GEL Apply 2 g topically 4 (four) times daily. (Patient taking differently: Apply 2 g topically 4 (four) times daily as needed (arthritis).) 150 g 5   gabapentin (NEURONTIN) 100 MG capsule Take 2 capsules (200 mg total) by mouth 3 (three) times daily. 540 capsule 1   Insulin Syringes,  Disposable, U-100 0.5 ML MISC 1 Syringe by Does not apply route 3 (three) times daily after meals. 100 each 11   LANTUS 100 UNIT/ML injection Inject 0.32 mLs (32 Units total) into the skin 2 (two) times daily. 57.6 mL 1   metoprolol tartrate (LOPRESSOR) 25 MG tablet Take 0.5 tablets (12.5 mg total) by mouth 2 (two) times daily. 90 tablet 1   mometasone-formoterol (DULERA) 100-5 MCG/ACT AERO Inhale 2 puffs into the lungs 2 (two) times daily. 8.8 g 0   oxyCODONE (OXY IR/ROXICODONE) 5 MG immediate release tablet  Take 1 tablet (5 mg total) by mouth every 6 (six) hours as needed for moderate pain or severe pain. 15 tablet 0   pantoprazole (PROTONIX) 40 MG tablet Take 1 tablet (40 mg total) by mouth at bedtime. 90 tablet 1   Phenylephrine-DM-GG (MUCINEX FAST-MAX CONGEST COUGH) 2.5-5-100 MG/5ML LIQD Take 20 mLs by mouth daily as needed (cough and chest congestion).     potassium chloride SA (KLOR-CON M) 20 MEQ tablet Take 1 tablet (20 mEq total) by mouth 2 (two) times daily. 180 tablet 1   QUEtiapine (SEROQUEL) 100 MG tablet Take 1 tablet (100 mg total) by mouth 2 (two) times daily. 180 tablet 1   senna-docusate (SENOKOT-S) 8.6-50 MG tablet Take 2 tablets by mouth at bedtime as needed for moderate constipation. 60 tablet 0   torsemide (DEMADEX) 10 MG tablet Take 1 tablet (10 mg total) by mouth daily. 90 tablet 1   No current facility-administered medications for this visit.    No Known Allergies  REVIEW OF SYSTEMS:   _0  denotes positive finding, _1  denotes negative finding Cardiac  Comments:  Chest pain or chest pressure:    Shortness of breath upon exertion:    Short of breath when lying flat:    Irregular heart rhythm:        Vascular    Pain in calf, thigh, or hip brought on by ambulation:    Pain in feet at night that wakes you up from your sleep:     Blood clot in your veins: x   Leg swelling:  x       Pulmonary    Oxygen at home:    Productive cough:     Wheezing:          Neurologic    Sudden weakness in arms or legs:     Sudden numbness in arms or legs:     Sudden onset of difficulty speaking or slurred speech:    Temporary loss of vision in one eye:     Problems with dizziness:         Gastrointestinal    Blood in stool:     Vomited blood:         Genitourinary    Burning when urinating:     Blood in urine:        Psychiatric    Major depression:         Hematologic    Bleeding problems:    Problems with blood clotting too easily:        Skin    Rashes or ulcers:        Constitutional    Fever or chills:      PHYSICAL EXAMINATION:  Today's Vitals   02/27/21 0912  BP: 135/79  Pulse: 81  Resp: 20  Temp: 98.3 F (36.8 C)  TempSrc: Temporal  SpO2: 94%  Weight: (!) 388 lb 14.4 oz (176.4 kg)  Height: _2  (1.727 m)   Body mass index is 59.13 kg/m.   General:  WDWN in NAD; vital signs documented above Gait: Not observed HENT: WNL, normocephalic; trach in place Pulmonary: normal non-labored breathing without wheezing Cardiac: regular HR Abdomen: obese Skin: without rashes Vascular Exam/Pulses: Bilateral radial pulses are palpable Extremities: still with swelling in left left but improved; left foot is warm and well perfused  Neurologic: A&O X 3;  moving all extremities equally Psychiatric:  The pt has Normal affect.   Non-Invasive Vascular Imaging:   Venous duplex on 02/27/2021:  IVC/Iliac Findings:  +----------+------+--------+-------------------+      IVC     Patent Thrombus      Comments         +----------+------+--------+-------------------+   IVC Prox                   Unable to visualize   +----------+------+--------+-------------------+   IVC Mid                    Unable to visualize   +----------+------+--------+-------------------+   IVC Distal patent                                +----------+------+--------+-------------------+       +----------------+---------+-----------+---------+-----------+-------------         CIV        RT-Patent RT-Thrombus LT-Patent LT-Thrombus     Comments  +----------------+---------+-----------+---------+-----------+-------------   Common Iliac                                                    Unable to  Prox visualize  +----------------+---------+-----------+---------+-----------+-------------   Common Iliac Mid                                                Unable to visualize  +----------------+---------+-----------+---------+-----------+-------------   Common Iliac                                                    Unable to  Distal visualize  +----------------+---------+-----------+---------+-----------+-------------   +-------------------------+---------+-----------+---------+-----------+----              EIV             RT-Patent RT-Thrombus LT-Patent LT-Thrombus Comments   +-------------------------+---------+-----------+---------+-----------+----   External Iliac Vein Prox                         patent                     +-------------------------+---------+-----------+---------+-----------+----   External Iliac Vein Mid                          patent                     +-------------------------+---------+-----------+---------+-----------+----   External Iliac Vein                              patent       Distal                             +-------------------------+---------+-----------+---------+-----------+----   Summary:  IVC/Iliac: Very limited exam due to bowel gas and body habitus.  Proximal/mid IVC and bilateral common iliac veins were not visualized.  Distal IVC and left external iliac vein appear patent.   Timothy Le is a 61 y.o. male who  is s/p Mechanical thrombectomy of IVC, Left CIV, EIV, CFV, FV, Angioplasty and stenting of Left EIV, CFV on 01/10/2021 by Dr. Scot Dock.    -pt feet are well perfused. -he states he wears his compression and elevates his  legs when he can.  Discussed continuing this.   -duplex today was limited due to bowel gas and body habitus, however, the distal IVC and left EIV were patent.  His symptoms have improved.  Will bring him back for duplex in 6 months for left IVC and left iliac vein duplex.  He understands that given recurrent DVT, he will be on anticoagulation for life.     Leontine Locket, [facility] Vascular [facility] Vein Specialists 02/27/2021 9:13 AM  Clinic MD:  Scot Dock

## 2021-03-07 ENCOUNTER — Other Ambulatory Visit: Payer: Self-pay | Admitting: *Deleted

## 2021-03-07 DIAGNOSIS — I824Y9 Acute embolism and thrombosis of unspecified deep veins of unspecified proximal lower extremity: Secondary | ICD-10-CM

## 2021-03-13 ENCOUNTER — Other Ambulatory Visit: Payer: Self-pay | Admitting: Nurse Practitioner

## 2021-04-15 ENCOUNTER — Other Ambulatory Visit: Payer: Self-pay | Admitting: Nurse Practitioner

## 2021-05-01 ENCOUNTER — Ambulatory Visit: Payer: Self-pay | Admitting: Nurse Practitioner

## 2021-05-02 ENCOUNTER — Ambulatory Visit (INDEPENDENT_AMBULATORY_CARE_PROVIDER_SITE_OTHER): Payer: Medicaid Other | Admitting: Nurse Practitioner

## 2021-05-02 ENCOUNTER — Encounter: Payer: Self-pay | Admitting: Nurse Practitioner

## 2021-05-02 VITALS — BP 135/72 | HR 91 | Temp 98.3°F | Ht 68.0 in | Wt 377.0 lb

## 2021-05-02 DIAGNOSIS — E1100 Type 2 diabetes mellitus with hyperosmolarity without nonketotic hyperglycemic-hyperosmolar coma (NKHHC): Secondary | ICD-10-CM | POA: Diagnosis not present

## 2021-05-02 DIAGNOSIS — Z930 Tracheostomy status: Secondary | ICD-10-CM | POA: Diagnosis not present

## 2021-05-02 LAB — POCT GLYCOSYLATED HEMOGLOBIN (HGB A1C)
HbA1c POC (<> result, manual entry): 12.1 % (ref 4.0–5.6)
HbA1c, POC (controlled diabetic range): 12.1 % — AB (ref 0.0–7.0)
HbA1c, POC (prediabetic range): 12.1 % — AB (ref 5.7–6.4)
Hemoglobin A1C: 12.1 % — AB (ref 4.0–5.6)

## 2021-05-02 MED ORDER — BLOOD GLUCOSE METER KIT: PACK | 0 refills | Status: DC

## 2021-05-02 NOTE — Patient Instructions (Signed)
You were seen today in the [facility] for reevaluation of DM. Labs were collected, results will be available via MyChart or, if abnormal, you will be contacted by clinic staff. You were prescribed medications, please take as directed. Please follow up in 3 mths for reevaluation of chronic illness. ?

## 2021-05-02 NOTE — Progress Notes (Signed)
? ?[facility] ?[place], [place]  [address] ?Phone:  [phone]   Fax:  [phone] ?Subjective:  ? Patient ID: Timothy Le, male    DOB: Jul 03, 1960, 61 y.o.   MRN: [id number] ? ?Chief Complaint  ?Patient presents with  ? Follow-up  ?  Patient is here today for his 3 month follow up visit and to discuss his left knee pain x 3 weeks. Patient states that he has had to surgeries on his left knee in the past due to blood clots in his left leg. Patient states that he has been taking tylenol but it not working. Patient is also needing a new Glucose kit.  ? ?HPI ?Timothy Le 61 y.o. male  has a past medical history of Asthma, Chronic respiratory failure with hypoxia ([place]) (07/09/2017), COPD (chronic obstructive pulmonary disease) ([place]), Diabetes mellitus without complication ([place]), Difficult intubation, Hypertension, Shortness of breath dyspnea, and Sleep apnea. To the [facility] for follow up for chronic illness. ? ?When questioned about the elevation of his A1C, states that he was without insulin for 1-2 wks. Also states that he has not taken BG in 1-2 mths, indicating that his machine is broken. Patient states that he has changed diet, limiting junk food. Does not exercise regularly due to chronic pain in BLE. ? ?Denies any blister or wounds to the bilateral feet. Denies any other concerns today. Currently compliant with all medications. Unemployed and receives disability. Has trach, states that he manages on his own at home.  ? ?Denies any fatigue, chest pain, shortness of breath, HA or dizziness. Denies any blurred vision, numbness or tingling. ? ? ?Past Medical History:  ?Diagnosis Date  ? Asthma   ? Chronic respiratory failure with hypoxia ([place]) 07/09/2017  ? COPD (chronic obstructive pulmonary disease) ([place])   ? Diabetes mellitus without complication ([place])   ? Difficult intubation   ? Hypertension   ? Shortness of breath dyspnea   ? Sleep apnea   ? ? ?Past Surgical History:   ?Procedure Laterality Date  ? INTRAVASCULAR ULTRASOUND/IVUS Left 12/11/2019  ? Procedure: Intravascular Ultrasound/IVUS;  Surgeon: Waynetta Sandy, MD;  Location: [place] [facility];  Service: Cardiovascular;  Laterality: Left;  Left lower extremity venous  ? IR GASTROSTOMY TUBE MOD SED  07/05/2017  ? IVC VENOGRAPHY N/A 01/10/2021  ? Procedure: IVC VENOGRAPHY - VENA CAVA;  Surgeon: Angelia Mould, MD;  Location: [place] [facility];  Service: Cardiovascular;  Laterality: N/A;  ? LOWER EXTREMITY VENOGRAPHY N/A 01/10/2021  ? Procedure: LOWER EXTREMITY VENOGRAPHY;  Surgeon: Angelia Mould, MD;  Location: [place] [facility];  Service: Cardiovascular;  Laterality: N/A;  ? PERIPHERAL VASCULAR THROMBECTOMY Left 12/11/2019  ? Procedure: PERIPHERAL VASCULAR THROMBECTOMY;  Surgeon: Waynetta Sandy, MD;  Location: [place] [facility];  Service: Cardiovascular;  Laterality: Left;  ? THYROIDECTOMY Left 08/30/2019  ? Procedure: THYROIDECTOMY CERVICAL LIPECTOMY;  Surgeon: Izora Gala, MD;  Location: [place];  Service: ENT;  Laterality: Left;  NEEDS RNFA PLEASE  ? TRACHEOSTOMY TUBE PLACEMENT N/A 06/17/2017  ? Procedure: TRACHEOSTOMY;  Surgeon: Leta Baptist, MD;  Location: [place];  Service: ENT;  Laterality: N/A;  ? TRACHEOSTOMY TUBE PLACEMENT N/A 08/14/2019  ? Procedure: TRACHEOSTOMY;  Surgeon: Izora Gala, MD;  Location: [place];  Service: ENT;  Laterality: N/A;  ? TRACHEOSTOMY TUBE PLACEMENT N/A 08/30/2019  ? Procedure: TRACHEOSTOMY  REVISION;  Surgeon: Izora Gala, MD;  Location: [place];  Service: ENT;  Laterality:  N/A;  ? ? ?Family History  ?Problem Relation Age of Onset  ? Asthma Sister   ? Asthma Other   ?     nephew  ? Hypertension Sister   ? Diabetes type II Sister   ? ? ?Social History  ? ?Socioeconomic History  ? Marital status: Legally Separated  ?  Spouse name: Not on file  ? Number of children: 1  ? Years of education: Not on file  ? Highest education level: Not on file  ?Occupational History  ?  Occupation: unemployed  ?  Comment: working on disablity  ?Tobacco Use  ? Smoking status: Former  ?  Packs/day: 0.50  ?  Years: 0.50  ?  Pack years: 0.25  ?  Types: Cigarettes  ?  Start date: 07/28/1983  ?  Quit date: 02/27/2013  ?  Years since quitting: 8.1  ? Smokeless tobacco: Never  ?Vaping Use  ? Vaping Use: Never used  ?Substance and Sexual Activity  ? Alcohol use: No  ?  Comment: quit ETOH about 5 months ago-beer and liquor  ? Drug use: No  ?  Comment: quit 6 months-"pot"-smoked couple joints a week  ? Sexual activity: Not Currently  ?Other Topics Concern  ? Not on file  ?Social History Narrative  ? Not on file  ? ?Social Determinants of Health  ? ?Financial Resource Strain: Not on file  ?Food Insecurity: Not on file  ?Transportation Needs: Not on file  ?Physical Activity: Not on file  ?Stress: Not on file  ?Social Connections: Not on file  ?Intimate Partner Violence: Not on file  ? ? ?Outpatient Medications Prior to Visit  ?Medication Sig Dispense Refill  ? acetaminophen (TYLENOL) 325 MG tablet Take 1-2 tablets (325-650 mg total) by mouth every 4 (four) hours as needed for mild pain. 60 tablet 5  ? allopurinol (ZYLOPRIM) 100 MG tablet Take 1 tablet (100 mg total) by mouth daily. 90 tablet 1  ? apixaban (ELIQUIS) 5 MG TABS tablet Take 1 tablet (5 mg total) by mouth 2 (two) times daily. 180 tablet 1  ? atorvastatin (LIPITOR) 10 MG tablet Take 1 tablet (10 mg total) by mouth daily. 90 tablet 1  ? BD INSULIN SYRINGE U/F 31G X 5/16" 0.5 ML MISC USE FOR DIABETIC TESTING    ? diclofenac Sodium (VOLTAREN) 1 % GEL Apply 2 g topically 4 (four) times daily. (Patient taking differently: Apply 2 g topically 4 (four) times daily as needed (arthritis).) 150 g 5  ? DULERA 100-5 MCG/ACT AERO INHALE 2 PUFFS INTO THE LUNGS TWICE A DAY 8.8 each 2  ? gabapentin (NEURONTIN) 100 MG capsule Take 2 capsules (200 mg total) by mouth 3 (three) times daily. 540 capsule 1  ? Insulin Syringes, Disposable, U-100 0.5 ML MISC 1 Syringe by Does  not apply route 3 (three) times daily after meals. 100 each 11  ? LANTUS 100 UNIT/ML injection Inject 0.32 mLs (32 Units total) into the skin 2 (two) times daily. 57.6 mL 1  ? metoprolol tartrate (LOPRESSOR) 25 MG tablet Take 0.5 tablets (12.5 mg total) by mouth 2 (two) times daily. 90 tablet 1  ? oxyCODONE (OXY IR/ROXICODONE) 5 MG immediate release tablet Take 1 tablet (5 mg total) by mouth every 6 (six) hours as needed for moderate pain or severe pain. 15 tablet 0  ? pantoprazole (PROTONIX) 40 MG tablet Take 1 tablet (40 mg total) by mouth at bedtime. 90 tablet 1  ? Phenylephrine-DM-GG (MUCINEX FAST-MAX CONGEST COUGH) 2.5-5-100 MG/5ML  LIQD Take 20 mLs by mouth daily as needed (cough and chest congestion).    ? potassium chloride SA (KLOR-CON M) 20 MEQ tablet Take 1 tablet (20 mEq total) by mouth 2 (two) times daily. 180 tablet 1  ? QUEtiapine (SEROQUEL) 100 MG tablet Take 1 tablet (100 mg total) by mouth 2 (two) times daily. 180 tablet 1  ? senna-docusate (SENOKOT-S) 8.6-50 MG tablet Take 2 tablets by mouth at bedtime as needed for moderate constipation. 60 tablet 0  ? sertraline (ZOLOFT) 100 MG tablet Take 100 mg by mouth daily.    ? torsemide (DEMADEX) 10 MG tablet Take 1 tablet (10 mg total) by mouth daily. 90 tablet 1  ? blood glucose meter kit and supplies Dispense based on patient and insurance preference. Use up to four times daily as directed. (FOR ICD-10 E10.9, E11.9). 1 each 0  ? ?No facility-administered medications prior to visit.  ? ? ?No Known Allergies ? ?Review of Systems  ?Constitutional:  Negative for chills, fever and malaise/fatigue.  ?Respiratory:  Negative for cough and shortness of breath.   ?Cardiovascular:  Negative for chest pain, palpitations and leg swelling.  ?Gastrointestinal:  Negative for abdominal pain, blood in stool, constipation, diarrhea, nausea and vomiting.  ?Skin: Negative.   ?Neurological: Negative.   ?Psychiatric/Behavioral:  Negative for depression. The patient is not  nervous/anxious.   ?All other systems reviewed and are negative. ? ?   ?Objective:  ?  ?Physical Exam ?Constitutional:   ?   General: He is not in acute distress. ?   Appearance: Normal appearance. He is

## 2021-05-08 ENCOUNTER — Ambulatory Visit (INDEPENDENT_AMBULATORY_CARE_PROVIDER_SITE_OTHER): Payer: Medicaid Other | Admitting: Internal Medicine

## 2021-05-08 ENCOUNTER — Encounter: Payer: Self-pay | Admitting: Internal Medicine

## 2021-05-08 VITALS — BP 132/80 | HR 82 | Ht 68.0 in | Wt 375.0 lb

## 2021-05-08 DIAGNOSIS — R739 Hyperglycemia, unspecified: Secondary | ICD-10-CM

## 2021-05-08 DIAGNOSIS — Z9009 Acquired absence of other part of head and neck: Secondary | ICD-10-CM | POA: Diagnosis not present

## 2021-05-08 DIAGNOSIS — E1165 Type 2 diabetes mellitus with hyperglycemia: Secondary | ICD-10-CM | POA: Diagnosis not present

## 2021-05-08 DIAGNOSIS — E1142 Type 2 diabetes mellitus with diabetic polyneuropathy: Secondary | ICD-10-CM

## 2021-05-08 DIAGNOSIS — Z794 Long term (current) use of insulin: Secondary | ICD-10-CM

## 2021-05-08 LAB — POCT GLUCOSE (DEVICE FOR HOME USE): Glucose Fasting, POC: 295 mg/dL — AB (ref 70–99)

## 2021-05-08 MED ORDER — INSULIN PEN NEEDLE 29G X 12MM MISC: 1.0000 | Freq: Every day | 3 refills | Status: DC

## 2021-05-08 MED ORDER — EMPAGLIFLOZIN 10 MG PO TABS: 10.0000 mg | ORAL_TABLET | Freq: Every day | ORAL | 2 refills | Status: DC

## 2021-05-08 MED ORDER — LANTUS SOLOSTAR 100 UNIT/ML ~~LOC~~ SOPN: 40.0000 [IU] | PEN_INJECTOR | Freq: Every day | SUBCUTANEOUS | 6 refills | Status: DC

## 2021-05-08 NOTE — Progress Notes (Signed)
?Name: Timothy Le  ?MRN/ DOB: [id number], [birth year]   ?Age/ Sex: 61 y.o., male   ? ?PCP: Vevelyn Francois, NP   ?Reason for Endocrinology Evaluation: Type 2 Diabetes Mellitus  ?   ?Date of Initial Endocrinology Visit: 05/08/2021   ? ? ?PATIENT IDENTIFIER: Timothy Le is a 61 y.o. male with a past medical history of T2DM, HTN, COPD, OSA (requiring trach placement), CHF, Hx of DVT. The patient presented for initial endocrinology clinic visit on 05/08/2021 for consultative assistance with his diabetes management.  ? ? ?HPI: ?Timothy Le was  ? ? ?Diagnosed with DM ~ 2013 ?Prior Medications tried/Intolerance: He was on NOvolog ?Currently checking blood sugars 0 x / day  ?Hypoglycemia episodes : unknown               ?Hemoglobin A1c has ranged from 6.8% in 2022, peaking at >15.5% in 2021. 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 ?In terms of diet, the patient 2-3 meals a day . Snacks occasionally . Avoids sugar-sweetened beverages. Rare ETOH use  ? 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 ?No hx of pancreatitis  ? ?Denies nausea, vomiting or diarrhea recently  ? ? ? ? ?HOME DIABETES REGIMEN: ?Lantus 35 units daily  ??  Metformin ? ? ?Statin: yes ?ACE-I/ARB: no  ?Prior Diabetic Education: yes ? ? ?METER DOWNLOAD SUMMARY: does not check  ? ? ? ? ? ?DIABETIC COMPLICATIONS: ?Microvascular complications:  ?Neuropathy  ?Denies: CKD ?Last eye exam: Completed 02/2021 ? ?Macrovascular complications:  ?CHF ?Denies: CAD, PVD, CVA ? ? ?PAST HISTORY: ?Past Medical History:  ?Past Medical History:  ?Diagnosis Date  ? Asthma   ? Chronic respiratory failure with hypoxia ([place]) 07/09/2017  ? COPD (chronic obstructive pulmonary disease) ([place])   ? Diabetes mellitus  without complication ([place])   ? Difficult intubation   ? Hypertension   ? Shortness of breath dyspnea   ? Sleep apnea   ? ?Past Surgical History:  ?Past Surgical History:  ?Procedure Laterality Date  ? INTRAVASCULAR ULTRASOUND/IVUS Left 12/11/2019  ? Procedure: Intravascular Ultrasound/IVUS;  Surgeon: Waynetta Sandy, MD;  Location: [facility];  Service: Cardiovascular;  Laterality: Left;  Left lower extremity venous  ? IR GASTROSTOMY TUBE MOD SED  07/05/2017  ? IVC VENOGRAPHY N/A 01/10/2021  ? Procedure: IVC VENOGRAPHY - VENA CAVA;  Surgeon: Angelia Mould, MD;  Location: [facility];  Service: Cardiovascular;  Laterality: N/A;  ? LOWER EXTREMITY VENOGRAPHY N/A 01/10/2021  ? Procedure: LOWER EXTREMITY VENOGRAPHY;  Surgeon: Angelia Mould, MD;  Location: [facility];  Service: Cardiovascular;  Laterality: N/A;  ? PERIPHERAL VASCULAR THROMBECTOMY Left 12/11/2019  ? Procedure: PERIPHERAL VASCULAR THROMBECTOMY;  Surgeon: Waynetta Sandy, MD;  Location: [facility];  Service: Cardiovascular;  Laterality: Left;  ? THYROIDECTOMY Left 08/30/2019  ? Procedure: THYROIDECTOMY CERVICAL LIPECTOMY;  Surgeon: Izora Gala, MD;  Location: [facility];  Service: ENT;  Laterality: Left;  NEEDS RNFA PLEASE  ? TRACHEOSTOMY TUBE PLACEMENT N/A 06/17/2017  ? Procedure: TRACHEOSTOMY;  Surgeon: Leta Baptist, MD;  Location: [facility];  Service: ENT;  Laterality: N/A;  ? TRACHEOSTOMY TUBE PLACEMENT N/A 08/14/2019  ? Procedure: TRACHEOSTOMY;  Surgeon: Izora Gala, MD;  Location:  [facility];  Service: ENT;  Laterality: N/A;  ? TRACHEOSTOMY TUBE PLACEMENT N/A 08/30/2019  ? Procedure: TRACHEOSTOMY  REVISION;  Surgeon: Izora Gala, MD;  Location: [facility];  Service: ENT;  Laterality: N/A;  ?  ?Social History:  reports that he quit smoking about 8 years ago. His smoking use included cigarettes. He started smoking about 37 years ago. He has a 0.25 pack-year smoking history. He has never used smokeless tobacco.  He reports that he does not drink alcohol and does not use drugs. ?Family History:  ?Family History  ?Problem Relation Age of Onset  ? Asthma Sister   ? Asthma Other   ?     nephew  ? Hypertension Sister   ? Diabetes type II Sister   ? ? ? ?HOME MEDICATIONS: ?Allergies as of 05/08/2021   ?No Known Allergies ?  ? ?  ?Medication List  ?  ? ?  ? Accurate as of May 08, 2021  8:42 AM. If you have any questions, ask your nurse or doctor.  ?  ?  ? ?  ? ?STOP taking these medications   ? ?Lantus 100 UNIT/ML injection ?Generic drug: insulin glargine ?Replaced by: Lantus SoloStar 100 UNIT/ML Solostar Pen ?Stopped by: Dorita Sciara, MD ?  ? ?  ? ?TAKE these medications   ? ?acetaminophen 325 MG tablet ?Commonly known as: TYLENOL ?Take 1-2 tablets (325-650 mg total) by mouth every 4 (four) hours as needed for mild pain. ?  ?allopurinol 100 MG tablet ?Commonly known as: ZYLOPRIM ?Take 1 tablet (100 mg total) by mouth daily. ?  ?apixaban 5 MG Tabs tablet ?Commonly known as: ELIQUIS ?Take 1 tablet (5 mg total) by mouth 2 (two) times daily. ?  ?atorvastatin 10 MG tablet ?Commonly known as: LIPITOR ?Take 1 tablet (10 mg total) by mouth daily. ?  ?BD Insulin Syringe U/F 31G X 5/16" 0.5 ML Misc ?Generic drug: Insulin Syringe-Needle U-100 ?USE FOR DIABETIC TESTING ?  ?blood glucose meter kit and supplies ?Dispense based on patient and insurance preference. Use up to four times daily as directed. (FOR ICD-10 E10.9, E11.9). ?  ?diclofenac Sodium 1 % Gel ?Commonly known as: VOLTAREN ?Apply 2 g topically 4 (four) times daily. ?What changed:  ?when to take this ?reasons to take this ?  ?Dulera 100-5 MCG/ACT Aero ?Generic drug: mometasone-formoterol ?INHALE 2 PUFFS INTO THE LUNGS TWICE A DAY ?  ?empagliflozin 10 MG Tabs tablet ?Commonly known as: Jardiance ?Take 1 tablet (10 mg total) by mouth daily before breakfast. ?Started by: Dorita Sciara, MD ?  ?gabapentin 100 MG capsule ?Commonly known as: NEURONTIN ?Take 2 capsules  (200 mg total) by mouth 3 (three) times daily. ?  ?Insulin Pen Needle 29G X 12MM Misc ?1 Device by Does not apply route daily in the afternoon. ?Started by: Dorita Sciara, MD ?  ?Insulin Syringes (Disposable) U-100 0.5 ML Misc ?1 Syringe by Does not apply route 3 (three) times daily after meals. ?  ?Lantus SoloStar 100 UNIT/ML Solostar Pen ?Generic drug: insulin glargine ?Inject 40 Units into the skin daily. ?Replaces: Lantus 100 UNIT/ML injection ?Started by: Dorita Sciara, MD ?  ?metFORMIN 500 MG 24 hr tablet ?Commonly known as: GLUCOPHAGE-XR ?Take 500 mg by mouth in the morning and at bedtime. ?  ?metoprolol tartrate 25 MG tablet ?Commonly known as: LOPRESSOR ?Take 0.5 tablets (12.5 mg total) by mouth 2 (two) times daily. ?  ?Mucinex Fast-Max Congest Cough 2.5-5-100 MG/5ML Liqd ?Generic drug: Phenylephrine-DM-GG ?Take 20 mLs  by mouth daily as needed (cough and chest congestion). ?  ?oxyCODONE 5 MG immediate release tablet ?Commonly known as: Oxy IR/ROXICODONE ?Take 1 tablet (5 mg total) by mouth every 6 (six) hours as needed for moderate pain or severe pain. ?  ?pantoprazole 40 MG tablet ?Commonly known as: PROTONIX ?Take 1 tablet (40 mg total) by mouth at bedtime. ?  ?potassium chloride SA 20 MEQ tablet ?Commonly known as: KLOR-CON M ?Take 1 tablet (20 mEq total) by mouth 2 (two) times daily. ?  ?QUEtiapine 100 MG tablet ?Commonly known as: SEROQUEL ?Take 1 tablet (100 mg total) by mouth 2 (two) times daily. ?  ?senna-docusate 8.6-50 MG tablet ?Commonly known as: Senokot-S ?Take 2 tablets by mouth at bedtime as needed for moderate constipation. ?  ?sertraline 100 MG tablet ?Commonly known as: ZOLOFT ?Take 100 mg by mouth daily. ?  ?torsemide 10 MG tablet ?Commonly known as: DEMADEX ?Take 1 tablet (10 mg total) by mouth daily. ?  ? ?  ? ? ? ?ALLERGIES: ?No Known Allergies ? ? ?REVIEW OF SYSTEMS: ?A comprehensive ROS was conducted with the patient and is negative except as per HPI  ?  ?OBJECTIVE:   ? ?VITAL SIGNS: BP 132/80 (BP Location: Left Arm, Patient Position: Sitting, Cuff Size: Large)   Pulse 82   Ht 5' 8" (1.727 m)   Wt (!) 375 lb (170.1 kg)   SpO2 93%   BMI 57.02 kg/m?   ? ?PHYSICAL EXAM:  ?G

## 2021-05-08 NOTE — Patient Instructions (Addendum)
-   Continue Metformin 500 mg Twice daily  ?- Increase Lantus to 40 units daily  ?- Start Jardiance 10 mg , 1 tablet daily  ? ? ? ?HOW TO TREAT LOW BLOOD SUGARS (Blood sugar LESS THAN 70 MG/DL) ?Please follow the RULE OF 15 for the treatment of hypoglycemia treatment (when your (blood sugars are less than 70 mg/dL)  ? ?STEP 1: Take 15 grams of carbohydrates when your blood sugar is low, which includes:  ?3-4 GLUCOSE TABS  OR ?3-4 OZ OF JUICE OR REGULAR SODA OR ?ONE TUBE OF GLUCOSE GEL   ? ?STEP 2: RECHECK blood sugar in 15 MINUTES ?STEP 3: If your blood sugar is still low at the 15 minute recheck --> then, go back to STEP 1 and treat AGAIN with another 15 grams of carbohydrates. ? ?

## 2021-05-09 ENCOUNTER — Telehealth: Payer: Self-pay

## 2021-05-09 ENCOUNTER — Other Ambulatory Visit (HOSPITAL_COMMUNITY): Payer: Self-pay

## 2021-05-09 NOTE — Telephone Encounter (Signed)
Patient Advocate Encounter ?  ?Received notification from [facility] that prior authorization for Jardiance '10mg'$  tabs is required by his/her insurance [place] [employer]. ?  ?PA submitted on 05/09/21 ? ?[place] Tracks#:  ?Status is pending ?   ?[facility] will continue to follow: ? ?Patient Advocate ?Fax: [phone]  ?

## 2021-05-09 NOTE — Telephone Encounter (Signed)
Patient Advocate Encounter ? ?Prior Authorization for Jardiance '10mg'$  tabs has been approved.   ? ?[place] Tracks #: [id number] ? ?Effective dates: 05/09/21 through 05/09/22 ? ?Per Test Claim Patients co-pay is $0.  ? ?Spoke with Pharmacy to Process. ? ?Patient Advocate ?Fax: [phone]  ?

## 2021-09-08 NOTE — Progress Notes (Unsigned)
Name: Timothy Le  MRN/ DOB: [id number], 1960-10-05   Age/ Sex: 61 y.o., male    PCP: Vevelyn Francois, NP   Reason for Endocrinology Evaluation: Type 2 Diabetes Mellitus     Date of Initial Endocrinology Visit: 05/08/2021    PATIENT IDENTIFIER: Timothy Le is a 61 y.o. male with a past medical history of T2DM, HTN, COPD, OSA (requiring trach placement), CHF, Hx of DVT. The patient presented for initial endocrinology clinic visit on 05/08/2021 for consultative assistance with his diabetes management.    HPI: Timothy Le was    Diagnosed with DM ~ 2013    Hemoglobin A1c has ranged from 6.8% in 2022, peaking at >15.5% in 2021.   Of note, he is S/P left thyroid lobectomy with benign pathology 08/2019 Follows with vascular for Hx of recurrent DVT and Le edema   No hx of pancreatitis   On his initial visit to our clinic his A1c was 12.1%, he was on basal insulin and metformin, which we continued and started on Jardiance   SUBJECTIVE:   During the last visit (05/08/2021): A1c 12.1%, increased Lantus, continue metformin, and started Jardiance      Today (09/09/21): [name] is here for follow-up on diabetes management.  He checks his blood sugars 0 times daily. H ehas accu chek meter but has not used it.    No metformin tabs in his medication bag  Has an empty bottle of Jardiance  He tells me he takes insulin twice a day    Denies nausea, vomiting or diarrhea   HOME DIABETES REGIMEN: Lantus 40 units daily - 38 units BID  Metformin twice daily- not taking  Jardiance 10 mg daily- not taking    Statin: yes ACE-I/ARB: no  Prior Diabetic Education: yes   METER DOWNLOAD SUMMARY: does not check   DIABETIC COMPLICATIONS: Microvascular complications:  Neuropathy  Denies: CKD Last eye exam: Completed 02/2021  Macrovascular complications:  CHF Denies: CAD, PVD, CVA   PAST HISTORY: Past Medical History:  Past Medical History:  Diagnosis Date    Asthma    Chronic respiratory failure with hypoxia ([place]) 07/09/2017   COPD (chronic obstructive pulmonary disease) ([facility])    Diabetes mellitus without complication ([place])    Difficult intubation    Hypertension    Shortness of breath dyspnea    Sleep apnea    Past Surgical History:  Past Surgical History:  Procedure Laterality Date   INTRAVASCULAR ULTRASOUND/IVUS Left 12/11/2019   Procedure: Intravascular Ultrasound/IVUS;  Surgeon: Waynetta Sandy, MD;  Location: [facility];  Service: Cardiovascular;  Laterality: Left;  Left lower extremity venous   IR GASTROSTOMY TUBE MOD SED  07/05/2017   IVC VENOGRAPHY N/A 01/10/2021   Procedure: IVC VENOGRAPHY - VENA CAVA;  Surgeon: Angelia Mould, MD;  Location: [facility];  Service: Cardiovascular;  Laterality: N/A;   LOWER EXTREMITY VENOGRAPHY N/A 01/10/2021   Procedure: LOWER EXTREMITY VENOGRAPHY;  Surgeon: Angelia Mould, MD;  Location: [facility];  Service: Cardiovascular;  Laterality: N/A;   PERIPHERAL VASCULAR THROMBECTOMY Left 12/11/2019   Procedure: PERIPHERAL VASCULAR THROMBECTOMY;  Surgeon: Waynetta Sandy, MD;  Location: [facility];  Service: Cardiovascular;  Laterality: Left;   THYROIDECTOMY Left 08/30/2019   Procedure: THYROIDECTOMY CERVICAL LIPECTOMY;  Surgeon: Izora Gala, MD;  Location: [facility];  Service: ENT;  Laterality: Left;  NEEDS RNFA PLEASE   TRACHEOSTOMY TUBE PLACEMENT N/A 06/17/2017   Procedure: TRACHEOSTOMY;  Surgeon:  Leta Baptist, MD;  Location: [facility];  Service: ENT;  Laterality: N/A;   TRACHEOSTOMY TUBE PLACEMENT N/A 08/14/2019   Procedure: TRACHEOSTOMY;  Surgeon: Izora Gala, MD;  Location: [facility];  Service: ENT;  Laterality: N/A;   TRACHEOSTOMY TUBE PLACEMENT N/A 08/30/2019   Procedure: TRACHEOSTOMY  REVISION;  Surgeon: Izora Gala, MD;  Location: [facility];  Service: ENT;  Laterality: N/A;    Social History:  reports that he quit smoking about 8 years ago. His  smoking use included cigarettes. He started smoking about 38 years ago. He has a 0.25 pack-year smoking history. He has never used smokeless tobacco. He reports that he does not drink alcohol and does not use drugs. Family History:  Family History  Problem Relation Age of Onset   Asthma Sister    Asthma Other        nephew   Hypertension Sister    Diabetes type II Sister      HOME MEDICATIONS: Allergies as of 09/09/2021   No Known Allergies      Medication List        Accurate as of September 09, 2021  9:27 AM. If you have any questions, ask your nurse or doctor.          allopurinol 100 MG tablet Commonly known as: ZYLOPRIM Take 1 tablet (100 mg total) by mouth daily.   apixaban 5 MG Tabs tablet Commonly known as: ELIQUIS Take 1 tablet (5 mg total) by mouth 2 (two) times daily.   atorvastatin 10 MG tablet Commonly known as: LIPITOR Take 1 tablet (10 mg total) by mouth daily.   BD Insulin Syringe U/F 31G X 5/16" 0.5 ML Misc Generic drug: Insulin Syringe-Needle U-100 USE FOR DIABETIC TESTING   blood glucose meter kit and supplies Dispense based on patient and insurance preference. Use up to four times daily as directed. (FOR ICD-10 E10.9, E11.9).   diclofenac Sodium 1 % Gel Commonly known as: VOLTAREN Apply 2 g topically 4 (four) times daily. What changed:  when to take this reasons to take this   Dulera 100-5 MCG/ACT Aero Generic drug: mometasone-formoterol INHALE 2 PUFFS INTO THE LUNGS TWICE A DAY   empagliflozin 10 MG Tabs tablet Commonly known as: Jardiance Take 1 tablet (10 mg total) by mouth daily before breakfast.   gabapentin 100 MG capsule Commonly known as: NEURONTIN Take 2 capsules (200 mg total) by mouth 3 (three) times daily.   Insulin Pen Needle 29G X 12MM Misc 1 Device by Does not apply route daily in the afternoon.   Insulin Syringes (Disposable) U-100 0.5 ML Misc 1 Syringe by Does not apply route 3 (three) times daily after meals.    Lantus SoloStar 100 UNIT/ML Solostar Pen Generic drug: insulin glargine Inject 40 Units into the skin daily.   metFORMIN 500 MG 24 hr tablet Commonly known as: GLUCOPHAGE-XR Take 500 mg by mouth in the morning and at bedtime.   metoprolol tartrate 25 MG tablet Commonly known as: LOPRESSOR Take 0.5 tablets (12.5 mg total) by mouth 2 (two) times daily.   Mucinex Fast-Max Congest Cough 2.5-5-100 MG/5ML Liqd Generic drug: Phenylephrine-DM-GG Take 20 mLs by mouth daily as needed (cough and chest congestion).   oxyCODONE 5 MG immediate release tablet Commonly known as: Oxy IR/ROXICODONE Take 1 tablet (5 mg total) by mouth every 6 (six) hours as needed for moderate pain or severe pain.   pantoprazole 40 MG tablet Commonly known as: PROTONIX Take 1 tablet (40 mg total) by mouth  at bedtime.   potassium chloride SA 20 MEQ tablet Commonly known as: KLOR-CON M Take 1 tablet (20 mEq total) by mouth 2 (two) times daily.   QUEtiapine 100 MG tablet Commonly known as: SEROQUEL Take 1 tablet (100 mg total) by mouth 2 (two) times daily.   senna-docusate 8.6-50 MG tablet Commonly known as: Senokot-S Take 2 tablets by mouth at bedtime as needed for moderate constipation.   sertraline 100 MG tablet Commonly known as: ZOLOFT Take 100 mg by mouth daily.   torsemide 10 MG tablet Commonly known as: DEMADEX Take 1 tablet (10 mg total) by mouth daily.         ALLERGIES: No Known Allergies   REVIEW OF SYSTEMS: A comprehensive ROS was conducted with the patient and is negative except as per HPI    OBJECTIVE:   VITAL SIGNS: BP 130/76 (BP Location: Left Arm, Patient Position: Sitting, Cuff Size: Large)   Pulse 100   Ht '5\' 8"'  (1.727 m)   Wt (!) 347 lb 6.4 oz (157.6 kg)   SpO2 94%   BMI 52.82 kg/m    PHYSICAL EXAM:  General: Pt appears well and is in NAD  Neck: General: Supple without adenopathy or carotid bruits. Thyroid: Trach in place   Lungs: Clear with good BS bilat    Heart: RRR   Extremities:  Lower extremities - 1+ pretibial edema.   Neuro: MS is good with appropriate affect, pt is alert and Ox3    DATA REVIEWED:  Lab Results  Component Value Date   HGBA1C 12.1 (A) 05/02/2021   HGBA1C 12.1 05/02/2021   HGBA1C 12.1 (A) 05/02/2021   HGBA1C 12.1 (A) 05/02/2021   Lab Results  Component Value Date   CHOL 153 01/11/2021   HDL 39 (L) 01/11/2021   LDLCALC 96 01/11/2021   TRIG 91 01/11/2021   CHOLHDL 3.9 01/11/2021        Latest Reference Range & Units 09/09/21 10:01  Sodium 135 - 145 mEq/L 137  Potassium 3.5 - 5.1 mEq/L 4.5  Chloride 96 - 112 mEq/L 97  CO2 19 - 32 mEq/L 30  Glucose 70 - 99 mg/dL 443 (H)  BUN 6 - 23 mg/dL 22  Creatinine 0.40 - 1.50 mg/dL 1.33  Calcium 8.4 - 10.5 mg/dL 10.4  GFR >60.00 mL/min 58.31 (L)    Latest Reference Range & Units 09/09/21 10:01  TSH 0.35 - 5.50 uIU/mL 1.68  T4,Free(Direct) 0.60 - 1.60 ng/dL 1.01     In Office BG 418 mg/dL   ASSESSMENT / PLAN / RECOMMENDATIONS:   1) Type 2 Diabetes Mellitus, Poorly controlled, With Neuropathic complications - Most recent A1c of 12.0 %. Goal A1c < 7.0 %.     -Unfortunately patient continues with hyperglycemia despite starting him on Jardiance, and adjusting his insulin.  On his last visit we confirmed that he was taking Lantus once daily, but today tells me he has been taking it twice a day -I also reviewed his medication bag which did not contain any metformin, but also had 1 bottle of Jardiance that was empty.  Patient tells me he is out of the [place], but I have refilled it for 9 months back in April? -He has not been checking his glucose, patient encouraged to check glucose, Accu-Chek guide strips were refilled today -We will restart him on metformin, new prescription has been sent -I will increase Jardiance as below -We will increase insulin as below as well -BMP today shows hyperglycemia, normal electrolytes, and slightly  lower  GFR  MEDICATIONS: Increase Lantus 44 units twice daily Increase Jardiance 25 mg , 1 tablet daily  Restart metformin 500 mg XR, 1 tablet BID   EDUCATION / INSTRUCTIONS: BG monitoring instructions: Patient is instructed to check his blood sugars 1 times a day, fasting . Call [facility] if: BG persistently < 70  I reviewed the Rule of 15 for the treatment of hypoglycemia in detail with the patient. Literature supplied.   2) Diabetic complications:  Eye: Does not have known diabetic retinopathy.  Neuro/ Feet: Does  have known diabetic peripheral neuropathy. Renal: Patient does not have known baseline CKD. He is not on an ACEI/ARB at present.     3) S/P left thyroid lobectomy    - S/P left thyroid lobectomy with benign pathology  -TFTs normal     F/U in 4 months    Signed electronically by: Mack Guise, MD  [facility]  [facility] [address], [place] [address] Phone: [phone] FAX: [phone]   CC: Vevelyn Francois, NP [address] [place] [address] Phone: [phone]  Fax: [phone]    Return to Endocrinology clinic as below: Future Appointments  Date Time Provider [place]  09/09/2021  9:50 AM Alvon Nygaard, Melanie Crazier, MD [id number] None  09/15/2021  4:15 PM Gardiner Barefoot, DPM [facility]

## 2021-09-09 ENCOUNTER — Ambulatory Visit (INDEPENDENT_AMBULATORY_CARE_PROVIDER_SITE_OTHER): Payer: Medicaid Other | Admitting: Internal Medicine

## 2021-09-09 ENCOUNTER — Encounter: Payer: Self-pay | Admitting: Internal Medicine

## 2021-09-09 ENCOUNTER — Other Ambulatory Visit: Payer: Self-pay | Admitting: Nurse Practitioner

## 2021-09-09 ENCOUNTER — Other Ambulatory Visit: Payer: Self-pay | Admitting: Internal Medicine

## 2021-09-09 VITALS — BP 130/76 | HR 100 | Ht 68.0 in | Wt 347.4 lb

## 2021-09-09 DIAGNOSIS — Z794 Long term (current) use of insulin: Secondary | ICD-10-CM | POA: Diagnosis not present

## 2021-09-09 DIAGNOSIS — Z9009 Acquired absence of other part of head and neck: Secondary | ICD-10-CM | POA: Diagnosis not present

## 2021-09-09 DIAGNOSIS — E1165 Type 2 diabetes mellitus with hyperglycemia: Secondary | ICD-10-CM

## 2021-09-09 DIAGNOSIS — E1142 Type 2 diabetes mellitus with diabetic polyneuropathy: Secondary | ICD-10-CM | POA: Diagnosis not present

## 2021-09-09 LAB — POCT GLYCOSYLATED HEMOGLOBIN (HGB A1C): Hemoglobin A1C: 12 % — AB (ref 4.0–5.6)

## 2021-09-09 LAB — BASIC METABOLIC PANEL
BUN: 22 mg/dL (ref 6–23)
CO2: 30 mEq/L (ref 19–32)
Calcium: 10.4 mg/dL (ref 8.4–10.5)
Chloride: 97 mEq/L (ref 96–112)
Creatinine, Ser: 1.33 mg/dL (ref 0.40–1.50)
GFR: 58.31 mL/min — ABNORMAL LOW (ref >60.00)
Glucose, Bld: 443 mg/dL — ABNORMAL HIGH (ref 70–99)
Potassium: 4.5 mEq/L (ref 3.5–5.1)
Sodium: 137 mEq/L (ref 135–145)

## 2021-09-09 LAB — TSH: TSH: 1.68 u[IU]/mL (ref 0.35–5.50)

## 2021-09-09 LAB — T4, FREE: Free T4: 1.01 ng/dL (ref 0.60–1.60)

## 2021-09-09 LAB — POCT GLUCOSE (DEVICE FOR HOME USE): Glucose Fasting, POC: 418 mg/dL — AB (ref 70–99)

## 2021-09-09 MED ORDER — METFORMIN HCL ER 500 MG PO TB24: 500.0000 mg | ORAL_TABLET | Freq: Two times a day (BID) | ORAL | 3 refills | Status: DC

## 2021-09-09 MED ORDER — INSULIN PEN NEEDLE 29G X 12MM MISC: 1.0000 | Freq: Two times a day (BID) | 3 refills | Status: DC

## 2021-09-09 MED ORDER — LANTUS SOLOSTAR 100 UNIT/ML ~~LOC~~ SOPN
44.0000 [IU] | PEN_INJECTOR | Freq: Two times a day (BID) | SUBCUTANEOUS | 6 refills | Status: DC
Start: 2021-09-09 — End: 2022-04-03

## 2021-09-09 MED ORDER — EMPAGLIFLOZIN 25 MG PO TABS: 25.0000 mg | ORAL_TABLET | Freq: Every day | ORAL | 3 refills | Status: DC

## 2021-09-09 MED ORDER — ACCU-CHEK GUIDE VI STRP: 1.0000 | ORAL_STRIP | Freq: Every day | 3 refills | Status: DC

## 2021-09-09 NOTE — Patient Instructions (Signed)
-   Restart Metformin 500 mg, 1 tablet twice a day  - Increase Lantus to 44 units Twice a day  - Increase Jardiance 25 mg , 1 tablet daily     HOW TO TREAT LOW BLOOD SUGARS (Blood sugar LESS THAN 70 MG/DL) Please follow the RULE OF 15 for the treatment of hypoglycemia treatment (when your (blood sugars are less than 70 mg/dL)   STEP 1: Take 15 grams of carbohydrates when your blood sugar is low, which includes:  3-4 GLUCOSE TABS  OR 3-4 OZ OF JUICE OR REGULAR SODA OR ONE TUBE OF GLUCOSE GEL    STEP 2: RECHECK blood sugar in 15 MINUTES STEP 3: If your blood sugar is still low at the 15 minute recheck --> then, go back to STEP 1 and treat AGAIN with another 15 grams of carbohydrates.

## 2021-09-11 ENCOUNTER — Telehealth: Payer: Self-pay | Admitting: Pharmacy Technician

## 2021-09-11 ENCOUNTER — Other Ambulatory Visit (HOSPITAL_COMMUNITY): Payer: Self-pay

## 2021-09-11 NOTE — Telephone Encounter (Signed)
Patient Advocate Encounter   Received notification from [employer] that RX for Accu-check test strips isn't processing. Office would like to know what's covered.   Per Test Claim: Not covered for institutionalized pt. Called pharmacy to let them know that was the rejection I was getting. They will follow up with the pt to find out if he is getting Home health or something right now, that would prevent him from getting this through his pharmacy benefit.

## 2021-09-15 ENCOUNTER — Ambulatory Visit (INDEPENDENT_AMBULATORY_CARE_PROVIDER_SITE_OTHER): Payer: Medicaid Other | Admitting: Podiatry

## 2021-09-15 ENCOUNTER — Encounter: Payer: Self-pay | Admitting: Podiatry

## 2021-09-15 DIAGNOSIS — M79674 Pain in right toe(s): Secondary | ICD-10-CM | POA: Diagnosis not present

## 2021-09-15 DIAGNOSIS — B351 Tinea unguium: Secondary | ICD-10-CM

## 2021-09-15 DIAGNOSIS — M79675 Pain in left toe(s): Secondary | ICD-10-CM

## 2021-09-15 DIAGNOSIS — E1165 Type 2 diabetes mellitus with hyperglycemia: Secondary | ICD-10-CM

## 2021-09-15 DIAGNOSIS — Z794 Long term (current) use of insulin: Secondary | ICD-10-CM

## 2021-09-15 DIAGNOSIS — N179 Acute kidney failure, unspecified: Secondary | ICD-10-CM

## 2021-09-15 NOTE — Progress Notes (Signed)
This patient returns to my office for at risk foot care.  This patient requires this care by a professional since this patient will be at risk due to having diabetes  This patient is unable to cut nails himself since the patient cannot reach his nails.These nails are painful walking and wearing shoes.  This patient presents for at risk foot care today.  General Appearance  Alert, conversant and in no acute stress.  Vascular  Dorsalis pedis and posterior tibial  pulses are  not   bilaterally due to severe swelling..  Capillary return is within normal limits  bilaterally. Temperature is within normal limits  bilaterally.  Neurologic  Senn-Weinstein monofilament wire test within normal limits  bilaterally. Muscle power within normal limits bilaterally.  Nails Thick disfigured discolored nails with subungual debris  from hallux to fifth toes bilaterally. No evidence of bacterial infection or drainage bilaterally.  Orthopedic  No limitations of motion  feet .  No crepitus or effusions noted.  No bony pathology or digital deformities noted.  HAV  B/L.  Skin  normotropic skin with no porokeratosis noted bilaterally.  No signs of infections or ulcers noted.   Plantar callus along weight bearing areas both feet.  Onychomycosis  Pain in right toes  Pain in left toes  Consent was obtained for treatment procedures.   Mechanical debridement of nails 1-5  bilaterally performed with a nail nipper.  Filed with dremel without incident.    Return office visit      3 months              Told patient to return for periodic foot care and evaluation due to potential at risk complications.   Gardiner Barefoot DPM

## 2021-11-11 ENCOUNTER — Other Ambulatory Visit: Payer: Self-pay | Admitting: Nurse Practitioner

## 2021-11-14 ENCOUNTER — Other Ambulatory Visit: Payer: Self-pay | Admitting: Nurse Practitioner

## 2021-12-16 ENCOUNTER — Other Ambulatory Visit: Payer: Self-pay | Admitting: Nurse Practitioner

## 2021-12-24 NOTE — Progress Notes (Unsigned)
[facility] [place]  Office Note  History of Present Illness: Timothy Le is a 61 year old male who presents for DVT follow-up.  In 2021, he was found to have a LLE DVT involving the left common femoral vein, SFJ, femoral vein, proximal profunda femoral vein, left popliteal and posterior tibial veins.  On 12/11/2019, he underwent mechanical thrombectomy of IVC, left common external leg veins, and left common femoral vein with In[place].  He also had balloon angioplasty of the left common femoral and femoral vein with left common iliac vein stent by Dr.Cain.  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  He also had angioplasty and stenting of the left external iliac vein and common femoral vein by Dr. Scot Dock.  At his last visit with [place] in February 2023, he was doing well on his Eliquis.  His left leg swelling was improving with compression and elevation as needed.  Since he has had recurrent DVT, he will be on anticoagulation for life.  At follow-up today, he is doing well.  He states that his left leg swelling is about the same as 6 months ago.  He still wears knee-high compression stockings and elevates his legs as needed.  He denies claudication, rest pain, nonhealing wounds of the lower extremities.  He is still taking his Eliquis without issue.  Past Medical History:  Diagnosis Date   Asthma    Chronic respiratory failure with hypoxia ([place]) 07/09/2017   COPD (chronic obstructive pulmonary disease) (HCC)    Diabetes mellitus without complication ([place])    Difficult intubation    Hypertension    Shortness of breath dyspnea    Sleep apnea     Past Surgical History:  Procedure Laterality Date   INTRAVASCULAR ULTRASOUND/IVUS Left 12/11/2019   Procedure: Intravascular Ultrasound/IVUS;  Surgeon: Waynetta Sandy, MD;  Location: [facility];  Service: Cardiovascular;  Laterality: Left;  Left lower extremity venous   IR GASTROSTOMY TUBE MOD SED  07/05/2017   IVC VENOGRAPHY N/A 01/10/2021   Procedure: IVC VENOGRAPHY - VENA CAVA;  Surgeon: Angelia Mould, MD;  Location: [facility];  Service: Cardiovascular;  Laterality: N/A;   LOWER EXTREMITY VENOGRAPHY N/A 01/10/2021   Procedure: LOWER EXTREMITY VENOGRAPHY;  Surgeon: Angelia Mould, MD;  Location: [facility];  Service: Cardiovascular;  Laterality: N/A;   PERIPHERAL VASCULAR THROMBECTOMY Left 12/11/2019   Procedure: PERIPHERAL VASCULAR THROMBECTOMY;  Surgeon: Waynetta Sandy, MD;  Location: [facility];  Service: Cardiovascular;  Laterality: Left;   THYROIDECTOMY Left 08/30/2019   Procedure: THYROIDECTOMY CERVICAL LIPECTOMY;  Surgeon: Izora Gala, MD;  Location: [facility];  Service: ENT;  Laterality: Left;  NEEDS RNFA PLEASE   TRACHEOSTOMY TUBE PLACEMENT N/A 06/17/2017   Procedure: TRACHEOSTOMY;  Surgeon: Leta Baptist, MD;  Location: [facility];  Service: ENT;  Laterality: N/A;   TRACHEOSTOMY TUBE PLACEMENT N/A 08/14/2019   Procedure: TRACHEOSTOMY;  Surgeon: Izora Gala, MD;  Location: [facility];  Service: ENT;  Laterality: N/A;   TRACHEOSTOMY TUBE PLACEMENT N/A 08/30/2019   Procedure: TRACHEOSTOMY  REVISION;  Surgeon: Izora Gala, MD;  Location: [facility];  Service: ENT;  Laterality: N/A;    ROS:   General:  No weight loss, Fever, chills  HEENT: No recent headaches, no nasal bleeding, no visual changes, no sore throat  Neurologic: No dizziness, blackouts, seizures. No recent symptoms  of stroke or mini- stroke. No recent episodes of slurred speech, or temporary blindness.  Cardiac: No recent episodes of chest pain/pressure, no shortness of breath at rest.  No shortness of breath with exertion.  Denies history of atrial fibrillation or irregular heartbeat  Vascular: No history of rest pain in feet.  No history of claudication.  No history of  non-healing ulcer, + hx of LLE DVT  Pulmonary:  + asthma, + trach  Musculoskeletal:  _0  Arthritis, _1  Low back pain,  _2  Joint pain  Hematologic:No history of hypercoagulable state.  No history of easy bleeding.  No history of anemia  Gastrointestinal: No hematochezia or melena,  No gastroesophageal reflux, no trouble swallowing  Urinary: _3  chronic Kidney disease, _4  on HD - _5  MWF or _6  TTHS, _7  Burning with urination, _8  Frequent urination, _9  Difficulty urinating;   Skin: No rashes  Psychological: No history of anxiety,  No history of depression  Social History Social History   Tobacco Use   Smoking status: Former    Packs/day: 0.50    Years: 0.50    Total pack years: 0.25    Types: Cigarettes    Start date: 07/28/1983    Quit date: 02/27/2013    Years since quitting: 8.8   Smokeless tobacco: Never  Vaping Use   Vaping Use: Never used  Substance Use Topics   Alcohol use: No    Comment: quit ETOH about 5 months ago-beer and liquor   Drug use: No    Comment: quit 6 months-"pot"-smoked couple joints a week    Family History Family History  Problem Relation Age of Onset   Asthma Sister    Asthma Other        nephew   Hypertension Sister    Diabetes type II Sister     Allergies  No Known Allergies   Current Outpatient Medications  Medication Sig Dispense Refill   ACCU-CHEK GUIDE test strip TEST 1 DAILY IN THE AFTERNOON. USE AS INSTRUCTED 100 strip 3   allopurinol (ZYLOPRIM) 100 MG tablet Take 1 tablet (100 mg total) by mouth daily. 90 tablet 1   atorvastatin (LIPITOR) 10 MG tablet TAKE 1 TABLET BY MOUTH EVERY DAY 90 tablet 1   BD INSULIN SYRINGE U/F 31G X 5/16" 0.5 ML MISC USE FOR DIABETIC TESTING     blood glucose meter kit and supplies Dispense based on patient and insurance preference. Use up to four times daily as directed. (FOR ICD-10 E10.9, E11.9). 1 each 0   diclofenac Sodium (VOLTAREN) 1 % GEL Apply 2 g topically 4 (four) times daily. (Patient  taking differently: Apply 2 g topically 4 (four) times daily as needed (arthritis).) 150 g 5   DULERA 100-5 MCG/ACT AERO INHALE 2 PUFFS INTO THE LUNGS TWICE A DAY 8.8 each 2   ELIQUIS 5 MG TABS tablet TAKE 1 TABLET BY MOUTH TWICE A DAY 180 tablet 1   empagliflozin (JARDIANCE) 25 MG TABS tablet Take 1 tablet (25 mg total) by mouth daily before breakfast. 90 tablet 3   gabapentin (NEURONTIN) 100 MG capsule Take 2 capsules (200 mg total) by mouth 3 (three) times daily. 540 capsule 1   insulin glargine (LANTUS SOLOSTAR) 100 UNIT/ML Solostar Pen Inject 44 Units into the skin 2 (two) times daily. 75 mL 6   Insulin Pen Needle 29G X 12MM MISC 1 Device by Does not apply route in the morning and at bedtime. 200 each  3   metFORMIN (GLUCOPHAGE-XR) 500 MG 24 hr tablet Take 1 tablet (500 mg total) by mouth in the morning and at bedtime. 180 tablet 3   metoprolol tartrate (LOPRESSOR) 25 MG tablet TAKE 0.5 TABLETS BY MOUTH 2 TIMES DAILY. 90 tablet 1   oxyCODONE (OXY IR/ROXICODONE) 5 MG immediate release tablet Take 1 tablet (5 mg total) by mouth every 6 (six) hours as needed for moderate pain or severe pain. 15 tablet 0   pantoprazole (PROTONIX) 40 MG tablet Take 1 tablet (40 mg total) by mouth at bedtime. 90 tablet 1   Phenylephrine-DM-GG (MUCINEX FAST-MAX CONGEST COUGH) 2.5-5-100 MG/5ML LIQD Take 20 mLs by mouth daily as needed (cough and chest congestion).     potassium chloride SA (KLOR-CON M) 20 MEQ tablet Take 1 tablet (20 mEq total) by mouth 2 (two) times daily. 180 tablet 1   QUEtiapine (SEROQUEL) 100 MG tablet Take 1 tablet (100 mg total) by mouth 2 (two) times daily. 180 tablet 1   senna-docusate (SENOKOT-S) 8.6-50 MG tablet Take 2 tablets by mouth at bedtime as needed for moderate constipation. 60 tablet 0   sertraline (ZOLOFT) 100 MG tablet Take 100 mg by mouth daily.     torsemide (DEMADEX) 10 MG tablet TAKE 1 TABLET BY MOUTH EVERY DAY 30 tablet 0   No current facility-administered medications for  this visit.    Physical Examination  Vitals:   12/25/21 0935  BP: (!) 165/85  Pulse: 64  Temp: 97.6 F (36.4 C)  TempSrc: Temporal  SpO2: 98%  Weight: (!) 354 lb (160.6 kg)  Height: _0  (1.727 m)    Body mass index is 53.83 kg/m.  General:  Alert and oriented, no acute distress HEENT: Normal Neck: No bruit or JVD Pulmonary: Clear to auscultation bilaterally Cardiac: Regular Rate and Rhythm without murmur Abdomen: Soft, non-tender, non-distended, no mass, no scars Skin: No rash Extremity Pulses: 2+ DP pulses bilaterally Musculoskeletal: Trace left lower extremity edema Neurologic: Upper and lower extremity motor 5/5 and symmetric  Noninvasive studies: IVC/iliac duplex (12/25/2021) IVC/Iliac: No evidence of thrombus in IVC and Iliac veins based on limited visualization. Visualization of proximal Inferior Vena Cava, mid inferior vena cava, distal Inferior Vena Cava, proximal common Iliac, mid common Iliac, distal common Iliac and proximal external Iliac vein was limited.   ASSESSMENT/PLAN:   -Based on duplex study today, IVC and left CIV stents were not well visualized due to body habitus. Based off limited exam, no evidence of thrombus in IVC and iliac veins. EIV stent is patent with no signs of stenosis -Left lower extremity swelling is well-controlled with compression stockings and leg elevation as needed.  He will continue with this -He denies any leg pain.  He has palpable DP pulses bilaterally -He understands that he will be on lifelong anticoagulation.  Currently he is on Eliquis -Follow up in 6 months again with IVC and L iliac vein duplex study   Vicente Serene, PA-C [facility] [place]: [phone]

## 2021-12-25 ENCOUNTER — Ambulatory Visit (HOSPITAL_COMMUNITY)
Admission: RE | Admit: 2021-12-25 | Discharge: 2021-12-25 | Disposition: A | Discharge status: Home / Self Care | Payer: Medicaid Other | Source: Ambulatory Visit | Attending: Vascular Surgery | Admitting: Vascular Surgery

## 2021-12-25 ENCOUNTER — Encounter: Payer: Self-pay | Admitting: Physician Assistant

## 2021-12-25 ENCOUNTER — Ambulatory Visit (INDEPENDENT_AMBULATORY_CARE_PROVIDER_SITE_OTHER): Payer: Medicaid Other | Admitting: Physician Assistant

## 2021-12-25 VITALS — BP 165/85 | HR 64 | Temp 97.6°F | Ht 68.0 in | Wt 354.0 lb

## 2021-12-25 DIAGNOSIS — I824Y9 Acute embolism and thrombosis of unspecified deep veins of unspecified proximal lower extremity: Secondary | ICD-10-CM

## 2021-12-29 ENCOUNTER — Ambulatory Visit: Payer: Self-pay | Admitting: Nurse Practitioner

## 2021-12-31 ENCOUNTER — Other Ambulatory Visit: Payer: Self-pay

## 2021-12-31 DIAGNOSIS — I824Y9 Acute embolism and thrombosis of unspecified deep veins of unspecified proximal lower extremity: Secondary | ICD-10-CM

## 2022-01-04 ENCOUNTER — Other Ambulatory Visit: Payer: Self-pay | Admitting: Nurse Practitioner

## 2022-01-05 ENCOUNTER — Ambulatory Visit: Payer: Self-pay | Admitting: Nurse Practitioner

## 2022-01-30 ENCOUNTER — Ambulatory Visit: Payer: Medicaid Other | Admitting: Internal Medicine

## 2022-01-30 NOTE — Progress Notes (Deleted)
Name: Timothy Le  MRN/ DOB: [id number], Aug 07, 1960   Age/ Sex: 62 y.o., male    PCP: Vevelyn Francois, NP   Reason for Endocrinology Evaluation: Type 2 Diabetes Mellitus     Date of Initial Endocrinology Visit: 05/08/2021    PATIENT IDENTIFIER: [name] is a 62 y.o. male with a past medical history of T2DM, HTN, COPD, OSA (requiring trach placement), CHF, Hx of DVT. The patient presented for initial endocrinology clinic visit on 05/08/2021 for consultative assistance with his diabetes management.    HPI: [name] was    Diagnosed with DM ~ 2013    Hemoglobin A1c has ranged from 6.8% in 2022, peaking at >15.5% in 2021.   Of note, he is S/P left thyroid lobectomy with benign pathology 08/2019 Follows with vascular for Hx of recurrent DVT and LE edema   No hx of pancreatitis   On his initial visit to our clinic his A1c was 12.1%, he was on basal insulin and metformin, which we continued and started on Jardiance   SUBJECTIVE:   During the last visit (09/09/2021): A1c 12.0%      Today (01/30/22): Mr. [name] is here for follow-up on diabetes management.  He checks his blood sugars 0 times daily. H ehas accu chek meter but has not used it.    He had a follow-up with vascular for history of DVT 12/25/2021 He was seen by podiatry 09/15/2021     HOME DIABETES REGIMEN: Lantus 44 units BID  Metformin 500 mg XR twice daily Jardiance 25 mg daily   Statin: yes ACE-I/ARB: no  Prior Diabetic Education: yes   METER DOWNLOAD SUMMARY: does not check   DIABETIC COMPLICATIONS: Microvascular complications:  Neuropathy  Denies: CKD Last eye exam: Completed 02/2021  Macrovascular complications:  CHF Denies: CAD, PVD, CVA   PAST HISTORY: Past Medical History:  Past Medical History:  Diagnosis Date   Asthma    Chronic respiratory failure with hypoxia ([place]) 07/09/2017   COPD (chronic obstructive pulmonary disease) (HCC)    Diabetes mellitus without  complication ([place])    Difficult intubation    Hypertension    Shortness of breath dyspnea    Sleep apnea    Past Surgical History:  Past Surgical History:  Procedure Laterality Date   INTRAVASCULAR ULTRASOUND/IVUS Left 12/11/2019   Procedure: Intravascular Ultrasound/IVUS;  Surgeon: Waynetta Sandy, MD;  Location: [facility];  Service: Cardiovascular;  Laterality: Left;  Left lower extremity venous   IR GASTROSTOMY TUBE MOD SED  07/05/2017   IVC VENOGRAPHY N/A 01/10/2021   Procedure: IVC VENOGRAPHY - VENA CAVA;  Surgeon: Angelia Mould, MD;  Location: [facility];  Service: Cardiovascular;  Laterality: N/A;   LOWER EXTREMITY VENOGRAPHY N/A 01/10/2021   Procedure: LOWER EXTREMITY VENOGRAPHY;  Surgeon: Angelia Mould, MD;  Location: [facility];  Service: Cardiovascular;  Laterality: N/A;   PERIPHERAL VASCULAR THROMBECTOMY Left 12/11/2019   Procedure: PERIPHERAL VASCULAR THROMBECTOMY;  Surgeon: Waynetta Sandy, MD;  Location: [facility];  Service: Cardiovascular;  Laterality: Left;   THYROIDECTOMY Left 08/30/2019   Procedure: THYROIDECTOMY CERVICAL LIPECTOMY;  Surgeon: Izora Gala, MD;  Location: [facility];  Service: ENT;  Laterality: Left;  NEEDS RNFA PLEASE   TRACHEOSTOMY TUBE PLACEMENT N/A 06/17/2017   Procedure: TRACHEOSTOMY;  Surgeon: Leta Baptist, MD;  Location: [facility];  Service: ENT;  Laterality: N/A;   TRACHEOSTOMY TUBE PLACEMENT N/A 08/14/2019   Procedure: TRACHEOSTOMY;  Surgeon: Constance Holster,  Garret Reddish, MD;  Location: [facility];  Service: ENT;  Laterality: N/A;   TRACHEOSTOMY TUBE PLACEMENT N/A 08/30/2019   Procedure: TRACHEOSTOMY  REVISION;  Surgeon: Izora Gala, MD;  Location: [facility];  Service: ENT;  Laterality: N/A;    Social History:  reports that he quit smoking about 8 years ago. His smoking use included cigarettes. He started smoking about 38 years ago. He has a 0.25 pack-year smoking history. He has never used smokeless tobacco. He  reports that he does not drink alcohol and does not use drugs. Family History:  Family History  Problem Relation Age of Onset   Asthma Sister    Asthma Other        nephew   Hypertension Sister    Diabetes type II Sister      HOME MEDICATIONS: Allergies as of 01/30/2022   No Known Allergies      Medication List        Accurate as of January 30, 2022  7:14 AM. If you have any questions, ask your nurse or doctor.          Accu-Chek Guide test strip Generic drug: glucose blood TEST 1 DAILY IN THE AFTERNOON. USE AS INSTRUCTED   allopurinol 100 MG tablet Commonly known as: ZYLOPRIM Take 1 tablet (100 mg total) by mouth daily.   atorvastatin 10 MG tablet Commonly known as: LIPITOR TAKE 1 TABLET BY MOUTH EVERY DAY   BD Insulin Syringe U/F 31G X 5/16" 0.5 ML Misc Generic drug: Insulin Syringe-Needle U-100 USE FOR DIABETIC TESTING   blood glucose meter kit and supplies Dispense based on patient and insurance preference. Use up to four times daily as directed. (FOR ICD-10 E10.9, E11.9).   diclofenac Sodium 1 % Gel Commonly known as: VOLTAREN Apply 2 g topically 4 (four) times daily.   Dulera 100-5 MCG/ACT Aero Generic drug: mometasone-formoterol INHALE 2 PUFFS INTO THE LUNGS TWICE A DAY   Eliquis 5 MG Tabs tablet Generic drug: apixaban TAKE 1 TABLET BY MOUTH TWICE A DAY   empagliflozin 25 MG Tabs tablet Commonly known as: Jardiance Take 1 tablet (25 mg total) by mouth daily before breakfast.   gabapentin 100 MG capsule Commonly known as: NEURONTIN Take 2 capsules (200 mg total) by mouth 3 (three) times daily.   Insulin Pen Needle 29G X 12MM Misc 1 Device by Does not apply route in the morning and at bedtime.   Lantus SoloStar 100 UNIT/ML Solostar Pen Generic drug: insulin glargine Inject 44 Units into the skin 2 (two) times daily.   metFORMIN 500 MG 24 hr tablet Commonly known as: GLUCOPHAGE-XR Take 1 tablet (500 mg total) by mouth in the morning and  at bedtime.   metoprolol tartrate 25 MG tablet Commonly known as: LOPRESSOR TAKE 0.5 TABLETS BY MOUTH 2 TIMES DAILY.   Mucinex Fast-Max Congest Cough 2.5-5-100 MG/5ML Liqd Generic drug: Phenylephrine-DM-GG Take 20 mLs by mouth daily as needed (cough and chest congestion).   pantoprazole 40 MG tablet Commonly known as: PROTONIX Take 1 tablet (40 mg total) by mouth at bedtime.   potassium chloride SA 20 MEQ tablet Commonly known as: KLOR-CON M Take 1 tablet (20 mEq total) by mouth 2 (two) times daily.   QUEtiapine 100 MG tablet Commonly known as: SEROQUEL Take 1 tablet (100 mg total) by mouth 2 (two) times daily.   senna-docusate 8.6-50 MG tablet Commonly known as: Senokot-S Take 2 tablets by mouth at bedtime as needed for moderate constipation.   sertraline 100 MG tablet  Commonly known as: ZOLOFT TAKE 1 TABLET BY MOUTH EVERY DAY   torsemide 10 MG tablet Commonly known as: DEMADEX TAKE 1 TABLET BY MOUTH EVERY DAY         ALLERGIES: No Known Allergies   REVIEW OF SYSTEMS: A comprehensive ROS was conducted with the patient and is negative except as per HPI    OBJECTIVE:   VITAL SIGNS: There were no vitals taken for this visit.   PHYSICAL EXAM:  General: Pt appears well and is in NAD  Neck: General: Supple without adenopathy or carotid bruits. Thyroid: Trach in place   Lungs: Clear with good BS bilat   Heart: RRR   Extremities:  Lower extremities - 1+ pretibial edema.   Neuro: MS is good with appropriate affect, pt is alert and Ox3   DM Foot Exam : per podiatry 09/15/2021    DATA REVIEWED:  Lab Results  Component Value Date   HGBA1C 12.0 (A) 09/09/2021   HGBA1C 12.1 (A) 05/02/2021   HGBA1C 12.1 05/02/2021   HGBA1C 12.1 (A) 05/02/2021   HGBA1C 12.1 (A) 05/02/2021   Lab Results  Component Value Date   CHOL 153 01/11/2021   HDL 39 (L) 01/11/2021   LDLCALC 96 01/11/2021   TRIG 91 01/11/2021   CHOLHDL 3.9 01/11/2021        Latest Reference Range  & Units 09/09/21 10:01  Sodium 135 - 145 mEq/L 137  Potassium 3.5 - 5.1 mEq/L 4.5  Chloride 96 - 112 mEq/L 97  CO2 19 - 32 mEq/L 30  Glucose 70 - 99 mg/dL 443 (H)  BUN 6 - 23 mg/dL 22  Creatinine 0.40 - 1.50 mg/dL 1.33  Calcium 8.4 - 10.5 mg/dL 10.4  GFR >60.00 mL/min 58.31 (L)    Latest Reference Range & Units 09/09/21 10:01  TSH 0.35 - 5.50 uIU/mL 1.68  T4,Free(Direct) 0.60 - 1.60 ng/dL 1.01     In Office BG *** mg/dL   ASSESSMENT / PLAN / RECOMMENDATIONS:   1) Type 2 Diabetes Mellitus, Poorly controlled, With Neuropathic complications - Most recent A1c of 12.0 %. Goal A1c < 7.0 %.     -Unfortunately patient continues with hyperglycemia despite starting him on Jardiance, and adjusting his insulin.  On his last visit we confirmed that he was taking Lantus once daily, but today tells me he has been taking it twice a day -I also reviewed his medication bag which did not contain any metformin, but also had 1 bottle of Jardiance that was empty.  Patient tells me he is out of the [place], but I have refilled it for 9 months back in April? -He has not been checking his glucose, patient encouraged to check glucose, Accu-Chek guide strips were refilled today -We will restart him on metformin, new prescription has been sent -I will increase Jardiance as below -We will increase insulin as below as well -BMP today shows hyperglycemia, normal electrolytes, and slightly lower GFR  MEDICATIONS: Increase Lantus 44 units twice daily Increase Jardiance 25 mg , 1 tablet daily  Restart metformin 500 mg XR, 1 tablet BID   EDUCATION / INSTRUCTIONS: BG monitoring instructions: Patient is instructed to check his blood sugars 1 times a day, fasting . Call [facility] if: BG persistently < 70  I reviewed the Rule of 15 for the treatment of hypoglycemia in detail with the patient. Literature supplied.   2) Diabetic complications:  Eye: Does not have known diabetic retinopathy.   Neuro/ Feet: Does  have known  diabetic peripheral neuropathy. Renal: Patient does not have known baseline CKD. He is not on an ACEI/ARB at present.     3) S/P left thyroid lobectomy    - S/P left thyroid lobectomy with benign pathology  -TFTs normal     F/U in 4 months    Signed electronically by: Mack Guise, MD  [facility] End[facility]  [facility] [address], [place] [address] Phone: [phone] FAX: [phone]   CC: Vevelyn Francois, NP [address] [place] [address] Phone: [phone]  Fax: [phone]    Return to Endocrinology clinic as below: Future Appointments  Date Time Provider [place]  01/30/2022  9:30 AM Junice Fei, Melanie Crazier, MD [id number] None

## 2022-03-18 ENCOUNTER — Other Ambulatory Visit: Payer: Self-pay | Admitting: Nurse Practitioner

## 2022-03-18 ENCOUNTER — Other Ambulatory Visit: Payer: Self-pay | Admitting: Internal Medicine

## 2022-03-18 MED ORDER — GABAPENTIN 100 MG PO CAPS: 200.0000 mg | ORAL_CAPSULE | Freq: Three times a day (TID) | ORAL | 0 refills | Status: DC

## 2022-03-18 NOTE — Telephone Encounter (Signed)
Pt has appt with you 04/03/22. Please advise [facility]

## 2022-03-19 ENCOUNTER — Other Ambulatory Visit: Payer: Self-pay | Admitting: Nurse Practitioner

## 2022-04-03 ENCOUNTER — Encounter: Payer: Self-pay | Admitting: Nurse Practitioner

## 2022-04-03 ENCOUNTER — Ambulatory Visit (INDEPENDENT_AMBULATORY_CARE_PROVIDER_SITE_OTHER): Payer: Medicaid Other | Admitting: Nurse Practitioner

## 2022-04-03 VITALS — BP 122/63 | HR 72 | Temp 98.3°F | Ht 68.0 in | Wt 338.4 lb

## 2022-04-03 DIAGNOSIS — J439 Emphysema, unspecified: Secondary | ICD-10-CM | POA: Diagnosis not present

## 2022-04-03 DIAGNOSIS — J40 Bronchitis, not specified as acute or chronic: Secondary | ICD-10-CM

## 2022-04-03 DIAGNOSIS — E785 Hyperlipidemia, unspecified: Secondary | ICD-10-CM | POA: Diagnosis not present

## 2022-04-03 DIAGNOSIS — Z23 Encounter for immunization: Secondary | ICD-10-CM | POA: Diagnosis not present

## 2022-04-03 DIAGNOSIS — F418 Other specified anxiety disorders: Secondary | ICD-10-CM

## 2022-04-03 DIAGNOSIS — Z9009 Acquired absence of other part of head and neck: Secondary | ICD-10-CM | POA: Diagnosis not present

## 2022-04-03 DIAGNOSIS — E1100 Type 2 diabetes mellitus with hyperosmolarity without nonketotic hyperglycemic-hyperosmolar coma (NKHHC): Secondary | ICD-10-CM | POA: Diagnosis not present

## 2022-04-03 DIAGNOSIS — Z930 Tracheostomy status: Secondary | ICD-10-CM | POA: Diagnosis not present

## 2022-04-03 DIAGNOSIS — M109 Gout, unspecified: Secondary | ICD-10-CM | POA: Diagnosis not present

## 2022-04-03 DIAGNOSIS — Z1211 Encounter for screening for malignant neoplasm of colon: Secondary | ICD-10-CM

## 2022-04-03 DIAGNOSIS — I82402 Acute embolism and thrombosis of unspecified deep veins of left lower extremity: Secondary | ICD-10-CM | POA: Diagnosis not present

## 2022-04-03 DIAGNOSIS — I5032 Chronic diastolic (congestive) heart failure: Secondary | ICD-10-CM

## 2022-04-03 LAB — POCT GLYCOSYLATED HEMOGLOBIN (HGB A1C): Hemoglobin A1C: 6.5 % — AB (ref 4.0–5.6)

## 2022-04-03 MED ORDER — LANTUS SOLOSTAR 100 UNIT/ML ~~LOC~~ SOPN: 44.0000 [IU] | PEN_INJECTOR | Freq: Two times a day (BID) | SUBCUTANEOUS | 6 refills | Status: DC

## 2022-04-03 MED ORDER — DULERA 100-5 MCG/ACT IN AERO: INHALATION_SPRAY | RESPIRATORY_TRACT | 2 refills | Status: DC

## 2022-04-03 MED ORDER — TORSEMIDE 10 MG PO TABS: 10.0000 mg | ORAL_TABLET | Freq: Every day | ORAL | 0 refills | Status: DC

## 2022-04-03 MED ORDER — ATORVASTATIN CALCIUM 10 MG PO TABS: 10.0000 mg | ORAL_TABLET | Freq: Every day | ORAL | 0 refills | Status: DC

## 2022-04-03 MED ORDER — SERTRALINE HCL 100 MG PO TABS: 100.0000 mg | ORAL_TABLET | Freq: Every day | ORAL | 0 refills | Status: DC

## 2022-04-03 MED ORDER — ALLOPURINOL 100 MG PO TABS: 100.0000 mg | ORAL_TABLET | Freq: Every day | ORAL | 0 refills | Status: DC

## 2022-04-03 MED ORDER — EMPAGLIFLOZIN 25 MG PO TABS: 25.0000 mg | ORAL_TABLET | Freq: Every day | ORAL | 3 refills | Status: DC

## 2022-04-03 NOTE — Progress Notes (Signed)
Established Patient Office Visit  Subjective:  Patient ID: Timothy Le, male    DOB: [birth year]  Age: 62 y.o. MRN: [id number]  CC:  Chief Complaint  Patient presents with   Follow-up    HPI Timothy Le is a 62 y.o. male  has a past medical history of Asthma, Chronic respiratory failure with hypoxia ([place]) (07/09/2017), COPD (chronic obstructive pulmonary disease) ([place]), Diabetes mellitus without complication ([place]), Difficult intubation, Hypertension, Severe obesity (BMI >= 40) ([place]), Shortness of breath dyspnea, Sleep apnea, and Tracheobronchitis.  Tracheobronchitis, morbid obesity, tracheostomy dependence, hyperlipidemia, DVT who presents to establish care with a new provider for his chronic medical conditions.  Patient was last seen in this office by [facility] NP.   Type 2 diabetes.  He is currently on Jardiance 25 mg daily, Lantus 44 units twice daily, metformin 500 mg daily.  Patient denies hypoglycemia no complaints of polyuria, polydipsia.  He is due for follow-up with endocrinologist.  Stated that he has not been able to follow-up with him due to lack of transportation, paperwork for assistance with transportation completed today. he was encouraged to follow-up with endocrinologist.   COPD/asthma.  Patient stated that his shortness of breath wheezing has been better since losing some weight.  He is not smoking currently.Takes Dulera 2 puffs into the lungs twice daily.  States that he needs tracheostomy supply, he has been doing tracheostomy management by himself at home, he is past due for follow-up with pulmonology referral placed today patient encouraged to follow-up with them  Due for flu vaccine flu vaccine given in the office today.  Patient encouraged to get Tdap vaccine and shingles vaccine at the pharmacy.   Patient stated that he has been taking all medications as prescribed.  He denies adverse reactions to current medications    Past Medical History:  Diagnosis  Date   Asthma    Chronic respiratory failure with hypoxia ([place]) 07/09/2017   COPD (chronic obstructive pulmonary disease) (HCC)    Diabetes mellitus without complication (HCC)    Difficult intubation    Hypertension    Severe obesity (BMI >= 40) (HCC)    Shortness of breath dyspnea    Sleep apnea    Tracheobronchitis     Past Surgical History:  Procedure Laterality Date   INTRAVASCULAR ULTRASOUND/IVUS Left 12/11/2019   Procedure: Intravascular Ultrasound/IVUS;  Surgeon: Waynetta Sandy, MD;  Location: [place] [facility];  Service: Cardiovascular;  Laterality: Left;  Left lower extremity venous   IR GASTROSTOMY TUBE MOD SED  07/05/2017   IVC VENOGRAPHY N/A 01/10/2021   Procedure: IVC VENOGRAPHY - VENA CAVA;  Surgeon: Angelia Mould, MD;  Location: [place] [facility];  Service: Cardiovascular;  Laterality: N/A;   LOWER EXTREMITY VENOGRAPHY N/A 01/10/2021   Procedure: LOWER EXTREMITY VENOGRAPHY;  Surgeon: Angelia Mould, MD;  Location: [place] [facility];  Service: Cardiovascular;  Laterality: N/A;   PERIPHERAL VASCULAR THROMBECTOMY Left 12/11/2019   Procedure: PERIPHERAL VASCULAR THROMBECTOMY;  Surgeon: Waynetta Sandy, MD;  Location: [place] [facility];  Service: Cardiovascular;  Laterality: Left;   THYROIDECTOMY Left 08/30/2019   Procedure: THYROIDECTOMY CERVICAL LIPECTOMY;  Surgeon: Izora Gala, MD;  Location: [place];  Service: ENT;  Laterality: Left;  NEEDS RNFA PLEASE   TRACHEOSTOMY TUBE PLACEMENT N/A 06/17/2017   Procedure: TRACHEOSTOMY;  Surgeon: Leta Baptist, MD;  Location: [place];  Service: ENT;  Laterality: N/A;   TRACHEOSTOMY TUBE PLACEMENT N/A 08/14/2019   Procedure: TRACHEOSTOMY;  Surgeon: Constance Holster,  Garret Reddish, MD;  Location: [place] [facility];  Service: ENT;  Laterality: N/A;   TRACHEOSTOMY TUBE PLACEMENT N/A 08/30/2019   Procedure: TRACHEOSTOMY  REVISION;  Surgeon: Izora Gala, MD;  Location: [facility];  Service: ENT;  Laterality: N/A;    Family History  Problem  Relation Age of Onset   Asthma Sister    Asthma Other        nephew   Hypertension Sister    Diabetes type II Sister     Social History   Socioeconomic History   Marital status: Legally Separated    Spouse name: Not on file   Number of children: 1   Years of education: Not on file   Highest education level: Not on file  Occupational History   Occupation: unemployed    Comment: working on disablity  Tobacco Use   Smoking status: Former    Packs/day: 0.50    Years: 0.50    Additional pack years: 0.00    Total pack years: 0.25    Types: Cigarettes    Start date: 07/28/1983    Quit date: 02/27/2013    Years since quitting: 9.1   Smokeless tobacco: Never  Vaping Use   Vaping Use: Never used  Substance and Sexual Activity   Alcohol use: No    Comment: quit ETOH about 5 months ago-beer and liquor   Drug use: No    Comment: quit 6 months-"pot"-smoked couple joints a week   Sexual activity: Not Currently  Other Topics Concern   Not on file  Social History Narrative   Lives home alone   Social Determinants of Health   Financial Resource Strain: Not on file  Food Insecurity: Not on file  Transportation Needs: Not on file  Physical Activity: Not on file  Stress: Not on file  Social Connections: Not on file  Intimate Partner Violence: Not on file    Outpatient Medications Prior to Visit  Medication Sig Dispense Refill   ACCU-CHEK GUIDE test strip TEST 1 [place]. USE AS INSTRUCTED 100 strip 3   BD INSULIN SYRINGE U/F 31G X 5/16" 0.5 ML MISC USE FOR DIABETIC TESTING     blood glucose meter kit and supplies Dispense based on patient and insurance preference. Use up to four times daily as directed. (FOR ICD-10 E10.9, E11.9). 1 each 0   diclofenac Sodium (VOLTAREN) 1 % GEL Apply 2 g topically 4 (four) times daily. 150 g 5   ELIQUIS 5 MG TABS tablet TAKE 1 TABLET BY MOUTH TWICE A DAY 180 tablet 1   gabapentin (NEURONTIN) 100 MG capsule Take 2 capsules (200 mg  total) by mouth 3 (three) times daily. 540 capsule 0   Insulin Pen Needle 29G X 12MM MISC 1 Device by Does not apply route in the morning and at bedtime. 200 each 3   metFORMIN (GLUCOPHAGE-XR) 500 MG 24 hr tablet Take 1 tablet (500 mg total) by mouth in the morning and at bedtime. 180 tablet 3   metoprolol tartrate (LOPRESSOR) 25 MG tablet TAKE 0.5 TABLETS BY MOUTH 2 TIMES DAILY. 90 tablet 1   pantoprazole (PROTONIX) 40 MG tablet Take 1 tablet (40 mg total) by mouth at bedtime. 90 tablet 1   potassium chloride SA (KLOR-CON M) 20 MEQ tablet Take 1 tablet (20 mEq total) by mouth 2 (two) times daily. 180 tablet 1   QUEtiapine (SEROQUEL) 100 MG tablet Take 1 tablet (100 mg total) by mouth 2 (two) times daily. 180 tablet 1  senna-docusate (SENOKOT-S) 8.6-50 MG tablet Take 2 tablets by mouth at bedtime as needed for moderate constipation. 60 tablet 0   atorvastatin (LIPITOR) 10 MG tablet TAKE 1 TABLET BY MOUTH EVERY DAY 90 tablet 1   DULERA 100-5 MCG/ACT AERO INHALE 2 PUFFS INTO THE LUNGS TWICE A DAY 8.8 each 2   empagliflozin (JARDIANCE) 25 MG TABS tablet Take 1 tablet (25 mg total) by mouth daily before breakfast. 90 tablet 3   insulin glargine (LANTUS SOLOSTAR) 100 UNIT/ML Solostar Pen Inject 44 Units into the skin 2 (two) times daily. 75 mL 6   sertraline (ZOLOFT) 100 MG tablet TAKE 1 TABLET BY MOUTH EVERY DAY 90 tablet 3   torsemide (DEMADEX) 10 MG tablet TAKE 1 TABLET BY MOUTH EVERY DAY 30 tablet 0   Phenylephrine-DM-GG (MUCINEX FAST-MAX CONGEST COUGH) 2.5-5-100 MG/5ML LIQD Take 20 mLs by mouth daily as needed (cough and chest congestion).     allopurinol (ZYLOPRIM) 100 MG tablet Take 1 tablet (100 mg total) by mouth daily. 90 tablet 1   No facility-administered medications prior to visit.    No Known Allergies  ROS Review of Systems  Constitutional:  Negative for activity change, appetite change, chills, diaphoresis and fatigue.  Eyes:  Negative for pain, discharge and itching.   Respiratory:  Positive for shortness of breath and wheezing.   Cardiovascular:  Negative for chest pain, palpitations and leg swelling.  Gastrointestinal:  Negative for abdominal distention, abdominal pain, anal bleeding, blood in stool and constipation.  Genitourinary:  Negative for difficulty urinating, dysuria, enuresis and flank pain.  Neurological:  Negative for dizziness, seizures, facial asymmetry, light-headedness, numbness and headaches.  Psychiatric/Behavioral:  Negative for agitation, behavioral problems, confusion, decreased concentration, dysphoric mood, hallucinations and suicidal ideas.       Objective:    Physical Exam Constitutional:      General: He is not in acute distress.    Appearance: He is obese. He is not ill-appearing, toxic-appearing or diaphoretic.  Eyes:     General: No scleral icterus.       Right eye: No discharge.        Left eye: No discharge.     Extraocular Movements: Extraocular movements intact.  Cardiovascular:     Rate and Rhythm: Normal rate and regular rhythm.     Pulses: Normal pulses.     Heart sounds: Normal heart sounds. No murmur heard.    No friction rub. No gallop.  Pulmonary:     Effort: Pulmonary effort is normal. No respiratory distress.     Breath sounds: Normal breath sounds. No stridor. No wheezing, rhonchi or rales.     Comments: Tracheostomy in place Chest:     Chest wall: No tenderness.  Abdominal:     General: There is no distension.     Tenderness: There is no abdominal tenderness. There is no guarding.  Musculoskeletal:        General: No signs of injury.     Right lower leg: No edema.     Left lower leg: No edema.  Skin:    General: Skin is warm and dry.  Neurological:     Mental Status: He is alert and oriented to person, place, and time.     Motor: No weakness.     Coordination: Coordination normal.     Gait: Gait normal.  Psychiatric:        Mood and Affect: Mood normal.        Behavior: Behavior normal.  Thought Content: Thought content normal.        Judgment: Judgment normal.     BP 122/63   Pulse 72   Temp 98.3 F (36.8 C)   Ht 5\' 8"  (1.727 m)   Wt (!) 338 lb 6.4 oz (153.5 kg)   SpO2 96%   BMI 51.45 kg/m  Wt Readings from Last 3 Encounters:  04/03/22 (!) 338 lb 6.4 oz (153.5 kg)  12/25/21 (!) 354 lb (160.6 kg)  09/09/21 (!) 347 lb 6.4 oz (157.6 kg)    Lab Results  Component Value Date   TSH 1.68 09/09/2021   Lab Results  Component Value Date   WBC 7.3 01/23/2021   HGB 14.8 01/23/2021   HCT 46.6 01/23/2021   MCV 87 01/23/2021   PLT 254 01/23/2021   Lab Results  Component Value Date   NA 137 09/09/2021   K 4.5 09/09/2021   CO2 30 09/09/2021   GLUCOSE 443 (H) 09/09/2021   BUN 22 09/09/2021   CREATININE 1.33 09/09/2021   BILITOT 0.4 01/23/2021   ALKPHOS 145 (H) 01/23/2021   AST 26 01/23/2021   ALT 20 01/08/2021   PROT 8.5 01/23/2021   ALBUMIN 4.2 01/23/2021   CALCIUM 10.4 09/09/2021   ANIONGAP 7 01/11/2021   EGFR 57 (L) 01/23/2021   GFR 58.31 (L) 09/09/2021   Lab Results  Component Value Date   CHOL 153 01/11/2021   Lab Results  Component Value Date   HDL 39 (L) 01/11/2021   Lab Results  Component Value Date   LDLCALC 96 01/11/2021   Lab Results  Component Value Date   TRIG 91 01/11/2021   Lab Results  Component Value Date   CHOLHDL 3.9 01/11/2021   Lab Results  Component Value Date   HGBA1C 6.5 (A) 04/03/2022      Assessment & Plan:   Problem List Items Addressed This Visit       Cardiovascular and Mediastinum   Chronic diastolic CHF (congestive heart failure) (HCC)    BP Readings from Last 3 Encounters:  04/03/22 122/63  12/25/21 (!) 165/85  09/09/21 130/76  Blood pressure currently well-controlled Continue metoprolol 12.5 mg twice daily, torsemide 10 mg daily, He is overdue for follow-up with cardiology Referral placed today      Relevant Medications   torsemide (DEMADEX) 10 MG tablet   atorvastatin (LIPITOR) 10  MG tablet   Acute deep vein thrombosis (DVT) of left lower extremity (HCC)    Continue Eliquis 5 mg twice daily       Relevant Medications   torsemide (DEMADEX) 10 MG tablet   atorvastatin (LIPITOR) 10 MG tablet     Respiratory   COPD (chronic obstructive pulmonary disease) ([place])    Patient encouraged to follow-up with pulmonology Continue Dulera inhaler as ordered      Relevant Medications   mometasone-formoterol (DULERA) 100-5 MCG/ACT AERO   Other Relevant Orders   Ambulatory referral to Pulmonology   Tracheobronchitis    No complaints today Has tracheostomy in place Patient was encouraged to follow-up with pulmonology        Endocrine   Type 2 diabetes mellitus with hyperosmolar nonketotic hyperglycemia ([place]) - Primary    Lab Results  Component Value Date   HGBA1C 6.5 (A) 04/03/2022  Chronic medical condition currently well-controlled Continue current dose of Lantus, Jardiance, metformin Patient counseled on low-carb modified diet Follow-up with endocrinologist Checking CMP, urine microalbumin States that he is up-to-date with diabetic eye exam records requested today Foot exam  at next visit      Relevant Medications   insulin glargine (LANTUS SOLOSTAR) 100 UNIT/ML Solostar Pen   atorvastatin (LIPITOR) 10 MG tablet   empagliflozin (JARDIANCE) 25 MG TABS tablet   Other Relevant Orders   POCT glycosylated hemoglobin (Hb A1C) (Completed)   CMP14+EGFR   CBC with Differential   Microalbumin / creatinine urine ratio     Other   Tracheostomy dependence ([place])    He has lost follow-up with pulmonology due to lack of transportation Stated that he is breathing and wheezing is much better Patient encouraged to follow-up with pulmonology        Hyperlipidemia    Patient not fasting today  Ldl goal is less than 70 Will check fasting lipid panel at next visit Atorvastatin 10 mg daily refilled Lab Results  Component Value Date   CHOL 153 01/11/2021   HDL 39 (L)  01/11/2021   LDLCALC 96 01/11/2021   TRIG 91 01/11/2021   CHOLHDL 3.9 01/11/2021   l      Relevant Medications   torsemide (DEMADEX) 10 MG tablet   atorvastatin (LIPITOR) 10 MG tablet   Depression with anxiety       04/03/2022   10:27 AM 05/02/2021    8:18 AM 01/23/2021   10:27 AM 01/23/2021    9:55 AM 10/21/2020    9:57 AM  Depression screen PHQ 2/9  Decreased Interest 0 0 0 0 0  Down, Depressed, Hopeless 0 0 0 0 0  PHQ - 2 Score 0 0 0 0 0  Altered sleeping 0  0    Tired, decreased energy 0  0    Change in appetite 0  0    Feeling bad or failure about yourself  0  0    Trouble concentrating 0  0    Moving slowly or fidgety/restless 0  0    Suicidal thoughts 0  0    PHQ-9 Score 0  0    Chronic condition currently well-controlled Continue Zoloft 100 mg daily, Seroquel 100 mg twice daily      Relevant Medications   sertraline (ZOLOFT) 100 MG tablet   History of lobectomy of thyroid   Need for influenza vaccination    Patient educated on CDC recommendation for the vaccine. Verbal consent was obtained from the patient, vaccine administered by nurse, no sign of adverse reactions noted at this time. Patient education on arm soreness and use of tylenol  for this patient  was discussed. Patient educated on the signs and symptoms of adverse effect and advise to contact the office if they occur.       Relevant Orders   Flu Vaccine QUAD 64mo+IM (Fluarix, Fluzone & Alfiuria Quad PF) (Completed)   Other Visit Diagnoses     Chronic diastolic (congestive) heart failure (HCC)       Relevant Medications   torsemide (DEMADEX) 10 MG tablet   atorvastatin (LIPITOR) 10 MG tablet   Other Relevant Orders   Ambulatory referral to Cardiology   Gout of both feet       Relevant Medications   allopurinol (ZYLOPRIM) 100 MG tablet   Screening for colon cancer       Relevant Orders   Cologuard       Meds ordered this encounter  Medications   mometasone-formoterol (DULERA) 100-5 MCG/ACT AERO     Sig: INHALE 2 PUFFS INTO THE LUNGS TWICE A DAY    Dispense:  8.8 each    Refill:  2  insulin glargine (LANTUS SOLOSTAR) 100 UNIT/ML Solostar Pen    Sig: Inject 44 Units into the skin 2 (two) times daily.    Dispense:  75 mL    Refill:  6   torsemide (DEMADEX) 10 MG tablet    Sig: Take 1 tablet (10 mg total) by mouth daily.    Dispense:  90 tablet    Refill:  0   sertraline (ZOLOFT) 100 MG tablet    Sig: Take 1 tablet (100 mg total) by mouth daily.    Dispense:  90 tablet    Refill:  0   atorvastatin (LIPITOR) 10 MG tablet    Sig: Take 1 tablet (10 mg total) by mouth daily.    Dispense:  90 tablet    Refill:  0    PATIENT NEEDS REFILL   empagliflozin (JARDIANCE) 25 MG TABS tablet    Sig: Take 1 tablet (25 mg total) by mouth daily before breakfast.    Dispense:  90 tablet    Refill:  3   allopurinol (ZYLOPRIM) 100 MG tablet    Sig: Take 1 tablet (100 mg total) by mouth daily.    Dispense:  90 tablet    Refill:  0    Follow-up: Return in about 3 months (around 07/04/2022).    Renee Rival, FNP

## 2022-04-03 NOTE — Assessment & Plan Note (Signed)
BP Readings from Last 3 Encounters:  04/03/22 122/63  12/25/21 (!) 165/85  09/09/21 130/76  Blood pressure currently well-controlled Continue metoprolol 12.5 mg twice daily, torsemide 10 mg daily, He is overdue for follow-up with cardiology Referral placed today

## 2022-04-03 NOTE — Assessment & Plan Note (Signed)
    04/03/2022   10:27 AM 05/02/2021    8:18 AM 01/23/2021   10:27 AM 01/23/2021    9:55 AM 10/21/2020    9:57 AM  Depression screen PHQ 2/9  Decreased Interest 0 0 0 0 0  Down, Depressed, Hopeless 0 0 0 0 0  PHQ - 2 Score 0 0 0 0 0  Altered sleeping 0  0    Tired, decreased energy 0  0    Change in appetite 0  0    Feeling bad or failure about yourself  0  0    Trouble concentrating 0  0    Moving slowly or fidgety/restless 0  0    Suicidal thoughts 0  0    PHQ-9 Score 0  0    Chronic condition currently well-controlled Continue Zoloft 100 mg daily, Seroquel 100 mg twice daily

## 2022-04-03 NOTE — Patient Instructions (Signed)
Please shingles and TDAP vaccines at the pharmacy, flu vaccine in the office today  Please Follow up with your endocrinologist as dicussed Will refer you to pulmonology and cardiology as discussed.   1. Type 2 diabetes mellitus with hyperosmolar nonketotic hyperglycemia (HCC)  - POCT glycosylated hemoglobin (Hb A1C) - insulin glargine (LANTUS SOLOSTAR) 100 UNIT/ML Solostar Pen; Inject 44 Units into the skin 2 (two) times daily.  Dispense: 75 mL; Refill: 6 - atorvastatin (LIPITOR) 10 MG tablet; Take 1 tablet (10 mg total) by mouth daily.  Dispense: 90 tablet; Refill: 0 - empagliflozin (JARDIANCE) 25 MG TABS tablet; Take 1 tablet (25 mg total) by mouth daily before breakfast.  Dispense: 90 tablet; Refill: 3 - CMP14+EGFR - CBC with Differential - Microalbumin / creatinine urine ratio  2. Chronic diastolic (congestive) heart failure (HCC)  - torsemide (DEMADEX) 10 MG tablet; Take 1 tablet (10 mg total) by mouth daily.  Dispense: 90 tablet; Refill: 0  3. Pulmonary emphysema, unspecified emphysema type ([place]) - mometasone-formoterol (DULERA) 100-5 MCG/ACT AERO; INHALE 2 PUFFS INTO THE LUNGS TWICE A DAY  Dispense: 8.8 each; Refill: 2  4. Depression with anxiety  - sertraline (ZOLOFT) 100 MG tablet; Take 1 tablet (100 mg total) by mouth daily.  Dispense: 90 tablet; Refill: 0  5. Gout of both feet  - allopurinol (ZYLOPRIM) 100 MG tablet; Take 1 tablet (100 mg total) by mouth daily.  Dispense: 90 tablet; Refill: 0  6. Screening for colon cancer  - Cologuard  7. Need for influenza vaccination       It is important that you exercise regularly at least 30 minutes 5 times a week as tolerated  Think about what you will eat, plan ahead. Choose " clean, green, fresh or frozen" over canned, processed or packaged foods which are more sugary, salty and fatty. 70 to 75% of food eaten should be vegetables and fruit. Three meals at set times with snacks allowed between meals, but they must be  fruit or vegetables. Aim to eat over a 12 hour period , example 7 am to 7 pm, and STOP after  your last meal of the day. Drink water,generally about 64 ounces per day, no other drink is as healthy. Fruit juice is best enjoyed in a healthy way, by EATING the fruit.  Thanks for choosing [facility] we consider it a privelige to serve you.

## 2022-04-03 NOTE — Assessment & Plan Note (Signed)
Lab Results  Component Value Date   HGBA1C 6.5 (A) 04/03/2022  Chronic medical condition currently well-controlled Continue current dose of Lantus, Jardiance, metformin Patient counseled on low-carb modified diet Follow-up with endocrinologist Checking CMP, urine microalbumin States that he is up-to-date with diabetic eye exam records requested today Foot exam at next visit

## 2022-04-03 NOTE — Assessment & Plan Note (Signed)
Continue Eliquis 5 mg twice daily. 

## 2022-04-03 NOTE — Assessment & Plan Note (Signed)
Patient educated on CDC recommendation for the vaccine. Verbal consent was obtained from the patient, vaccine administered by nurse, no sign of adverse reactions noted at this time. Patient education on arm soreness and use of tylenol  for this patient  was discussed. Patient educated on the signs and symptoms of adverse effect and advise to contact the office if they occur.  ?

## 2022-04-03 NOTE — Assessment & Plan Note (Signed)
No complaints today Has tracheostomy in place Patient was encouraged to follow-up with pulmonology

## 2022-04-03 NOTE — Assessment & Plan Note (Signed)
Patient not fasting today  Ldl goal is less than 70 Will check fasting lipid panel at next visit Atorvastatin 10 mg daily refilled Lab Results  Component Value Date   CHOL 153 01/11/2021   HDL 39 (L) 01/11/2021   LDLCALC 96 01/11/2021   TRIG 91 01/11/2021   CHOLHDL 3.9 01/11/2021   l

## 2022-04-03 NOTE — Assessment & Plan Note (Signed)
He has lost follow-up with pulmonology due to lack of transportation Stated that he is breathing and wheezing is much better Patient encouraged to follow-up with pulmonology

## 2022-04-03 NOTE — Assessment & Plan Note (Signed)
Patient encouraged to follow-up with pulmonology Continue [facility] inhaler as ordered

## 2022-04-03 NOTE — Assessment & Plan Note (Signed)
>>  ASSESSMENT AND PLAN FOR TYPE 2 DIABETES MELLITUS WITH HYPEROSMOLAR NONKETOTIC HYPERGLYCEMIA (HCC) WRITTEN ON 04/03/2022 11:54 AM BY Shawneen Deetz, Baird Kay, FNP  Lab Results  Component Value Date   HGBA1C 6.5 (A) 04/03/2022  Chronic medical condition currently well-controlled Continue current dose of Lantus, Jardiance, metformin Patient counseled on low-carb modified diet Follow-up with endocrinologist Checking CMP, urine microalbumin States that he is up-to-date with diabetic eye exam records requested today Foot exam at next visit

## 2022-04-04 LAB — CBC WITH DIFFERENTIAL/PLATELET
Basophils Absolute: 0.1 10*3/uL (ref 0.0–0.2)
Basos: 1 %
EOS (ABSOLUTE): 0.1 10*3/uL (ref 0.0–0.4)
Eos: 3 %
Hematocrit: 45.3 % (ref 37.5–51.0)
Hemoglobin: 14.5 g/dL (ref 13.0–17.7)
Immature Grans (Abs): 0 10*3/uL (ref 0.0–0.1)
Immature Granulocytes: 0 %
Lymphocytes Absolute: 2 10*3/uL (ref 0.7–3.1)
Lymphs: 39 %
MCH: 27.3 pg (ref 26.6–33.0)
MCHC: 32 g/dL (ref 31.5–35.7)
MCV: 85 fL (ref 79–97)
Monocytes Absolute: 0.3 10*3/uL (ref 0.1–0.9)
Monocytes: 7 %
Neutrophils Absolute: 2.6 10*3/uL (ref 1.4–7.0)
Neutrophils: 50 %
Platelets: 222 10*3/uL (ref 150–450)
RBC: 5.31 x10E6/uL (ref 4.14–5.80)
RDW: 14.5 % (ref 11.6–15.4)
WBC: 5.2 10*3/uL (ref 3.4–10.8)

## 2022-04-04 LAB — CMP14+EGFR
ALT: 24 IU/L (ref 0–44)
AST: 26 IU/L (ref 0–40)
Albumin/Globulin Ratio: 1.2 (ref 1.2–2.2)
Albumin: 4.1 g/dL (ref 3.8–4.9)
Alkaline Phosphatase: 134 IU/L — ABNORMAL HIGH (ref 44–121)
BUN/Creatinine Ratio: 13 (ref 10–24)
BUN: 16 mg/dL (ref 8–27)
Bilirubin Total: 0.4 mg/dL (ref 0.0–1.2)
CO2: 24 mmol/L (ref 20–29)
Calcium: 9.2 mg/dL (ref 8.6–10.2)
Chloride: 100 mmol/L (ref 96–106)
Creatinine, Ser: 1.22 mg/dL (ref 0.76–1.27)
Globulin, Total: 3.3 g/dL (ref 1.5–4.5)
Glucose: 133 mg/dL — ABNORMAL HIGH (ref 70–99)
Potassium: 4.8 mmol/L (ref 3.5–5.2)
Sodium: 140 mmol/L (ref 134–144)
Total Protein: 7.4 g/dL (ref 6.0–8.5)
eGFR: 68 mL/min/{1.73_m2} (ref >59)

## 2022-04-06 LAB — MICROALBUMIN / CREATININE URINE RATIO
Creatinine, Urine: 95 mg/dL
Microalb/Creat Ratio: 34 mg/g creat — ABNORMAL HIGH (ref 0–29)
Microalbumin, Urine: 32.5 ug/mL

## 2022-04-06 NOTE — Progress Notes (Signed)
Called pt and inform results.Gh 

## 2022-04-06 NOTE — Progress Notes (Signed)
Liver enzymes is slightly elevated but better than previous readings, please avoid excessive use of Tylenol and alcohol.  Microalbuminuria.  Patient is currently on Jardiance.     Other labs are normal.

## 2022-04-29 ENCOUNTER — Ambulatory Visit: Payer: Medicaid Other | Attending: Internal Medicine | Admitting: Internal Medicine

## 2022-04-29 NOTE — Progress Notes (Deleted)
Cardiology Office Note:    Date:  04/29/2022   ID:  Timothy Le, DOB [birth year], MRN [id number]  PCP:  Donell Beers, FNP   [facility] Providers Cardiologist:  None { Click to update primary MD,subspecialty MD or APP then REFRESH:1}    Referring MD: Donell Beers, FNP   No chief complaint on file. ***  History of Present Illness:    Timothy Le is a 62 y.o. 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 INVASIVE CV LAB;  Service: Cardiovascular;  Laterality: Left;  Left lower extremity venous   IR GASTROSTOMY TUBE MOD SED  07/05/2017   IVC VENOGRAPHY N/A 01/10/2021   Procedure: IVC VENOGRAPHY - VENA CAVA;  Surgeon: Chuck Hint, MD;  Location: [facility] INVASIVE CV LAB;  Service: Cardiovascular;  Laterality: N/A;   LOWER EXTREMITY VENOGRAPHY N/A 01/10/2021   Procedure: LOWER EXTREMITY VENOGRAPHY;  Surgeon: Chuck Hint, MD;  Location: [facility] INVASIVE CV LAB;  Service: Cardiovascular;  Laterality: N/A;   PERIPHERAL VASCULAR THROMBECTOMY Left 12/11/2019   Procedure: PERIPHERAL VASCULAR THROMBECTOMY;  Surgeon: Maeola Harman, MD;  Location: [facility] INVASIVE CV LAB;  Service: Cardiovascular;  Laterality:  Left;   THYROIDECTOMY Left 08/30/2019   Procedure: THYROIDECTOMY CERVICAL LIPECTOMY;  Surgeon: Serena Colonel, MD;  Location: [facility] OR;  Service: ENT;  Laterality: Left;  NEEDS RNFA PLEASE   TRACHEOSTOMY TUBE PLACEMENT N/A 06/17/2017   Procedure: TRACHEOSTOMY;  Surgeon: Newman Pies, MD;  Location: [facility] OR;  Service: ENT;  Laterality: N/A;   TRACHEOSTOMY TUBE PLACEMENT N/A 08/14/2019   Procedure: TRACHEOSTOMY;  Surgeon: Serena Colonel, MD;  Location: [facility] OR;  Service: ENT;  Laterality: N/A;   TRACHEOSTOMY TUBE PLACEMENT N/A 08/30/2019   Procedure: TRACHEOSTOMY  REVISION;  Surgeon: Serena Colonel, MD;  Location: [facility] OR;  Service: ENT;  Laterality: N/A;    Current Medications: No outpatient medications have been marked as taking for the 04/29/22 encounter (Appointment) with [name], MD.     Allergies:   Patient has no known allergies.   Social History   Socioeconomic History   Marital status: Legally Separated    Spouse name: Not on file   Number of children: 1   Years of education: Not on file   Highest education level: Not on file  Occupational History   Occupation: unemployed    Comment: working on disablity  Tobacco Use   Smoking status: Former    Packs/day: 0.50    Years: 0.50    Additional pack years: 0.00    Total pack years: 0.25    Types: Cigarettes    Start date: 07/28/1983    Quit date: 02/27/2013    Years since quitting: 9.1   Smokeless tobacco: Never  Vaping Use  Vaping Use: Never used  Substance and Sexual Activity   Alcohol use: No    Comment: quit ETOH about 5 months ago-beer and liquor   Drug use: No    Comment: quit 6 months-"pot"-smoked couple joints a week   Sexual activity: Not Currently  Other Topics Concern   Not on file  Social History Narrative   Lives home alone   Social Determinants of Health   Financial Resource Strain: Not on file  Food Insecurity: Not on file  Transportation Needs: Not on file  Physical Activity: Not on file  Stress: Not on file   Social Connections: Not on file     Family History: The patient's ***family history includes Asthma in his sister and another family member; Diabetes type II in his sister; Hypertension in his sister.  ROS:   Please see the history of present illness.    *** All other systems reviewed and are negative.  EKGs/Labs/Other Studies Reviewed:    The following studies were reviewed today: ***  EKG:  EKG is *** ordered today.  The ekg ordered today demonstrates ***  Recent Labs: 09/09/2021: TSH 1.68 04/03/2022: ALT 24; BUN 16; Creatinine, Ser 1.22; Hemoglobin 14.5; Platelets 222; Potassium 4.8; Sodium 140  Recent Lipid Panel    Component Value Date/Time   CHOL 153 01/11/2021 0150   CHOL 159 10/12/2019 0904   TRIG 91 01/11/2021 0150   HDL 39 (L) 01/11/2021 0150   HDL 45 10/12/2019 0904   CHOLHDL 3.9 01/11/2021 0150   VLDL 18 01/11/2021 0150   LDLCALC 96 01/11/2021 0150   LDLCALC 94 10/12/2019 0904     Risk Assessment/Calculations:   {Does this patient have ATRIAL FIBRILLATION?:[id number]}  No BP recorded.  {Refresh Note OR Click here to enter BP  :1}***         Physical Exam:    VS:  There were no vitals taken for this visit.    Wt Readings from Last 3 Encounters:  04/03/22 (!) 338 lb 6.4 oz (153.5 kg)  12/25/21 (!) 354 lb (160.6 kg)  09/09/21 (!) 347 lb 6.4 oz (157.6 kg)     GEN: *** Well nourished, well developed in no acute distress HEENT: Normal NECK: No JVD; No carotid bruits LYMPHATICS: No lymphadenopathy CARDIAC: ***RRR, no murmurs, rubs, gallops RESPIRATORY:  Clear to auscultation without rales, wheezing or rhonchi  ABDOMEN: Soft, non-tender, non-distended MUSCULOSKELETAL:  No edema; No deformity  SKIN: Warm and dry NEUROLOGIC:  Alert and oriented x 3 PSYCHIATRIC:  Normal affect   ASSESSMENT:   HFpEF: morbidly obese,  - *** lasix - ***farxiga 10 mg daily -  *** spiro PLAN:    In order of problems listed above:  ***      {Are you ordering a  CV Procedure (e.g. stress test, cath, DCCV, TEE, etc)?   Press F2        :[id number]}    Medication Adjustments/Labs and Tests Ordered: Current medicines are reviewed at length with the patient today.  Concerns regarding medicines are outlined above.  No orders of the defined types were placed in this encounter.  No orders of the defined types were placed in this encounter.   There are no Patient Instructions on file for this visit.   Signed, [name], MD  04/29/2022 1:05 PM    [facility]

## 2022-06-15 ENCOUNTER — Other Ambulatory Visit: Payer: Self-pay | Admitting: Nurse Practitioner

## 2022-06-15 DIAGNOSIS — I5032 Chronic diastolic (congestive) heart failure: Secondary | ICD-10-CM

## 2022-06-16 NOTE — Telephone Encounter (Signed)
Please advise Kh 

## 2022-07-03 ENCOUNTER — Ambulatory Visit: Payer: Medicaid Other | Admitting: Nurse Practitioner

## 2022-07-10 ENCOUNTER — Other Ambulatory Visit: Payer: Self-pay | Admitting: Nurse Practitioner

## 2022-07-10 DIAGNOSIS — M109 Gout, unspecified: Secondary | ICD-10-CM

## 2022-09-07 ENCOUNTER — Other Ambulatory Visit: Payer: Self-pay

## 2022-09-07 ENCOUNTER — Emergency Department (HOSPITAL_COMMUNITY)
Admission: EM | Admit: 2022-09-07 | Discharge: 2022-09-07 | Disposition: A | Discharge status: Home / Self Care | Payer: Medicaid Other | Attending: Emergency Medicine | Admitting: Emergency Medicine

## 2022-09-07 ENCOUNTER — Encounter (HOSPITAL_COMMUNITY): Payer: Self-pay

## 2022-09-07 ENCOUNTER — Emergency Department (HOSPITAL_COMMUNITY): Payer: Medicaid Other

## 2022-09-07 DIAGNOSIS — Z1152 Encounter for screening for COVID-19: Secondary | ICD-10-CM | POA: Diagnosis not present

## 2022-09-07 DIAGNOSIS — Z7984 Long term (current) use of oral hypoglycemic drugs: Secondary | ICD-10-CM | POA: Insufficient documentation

## 2022-09-07 DIAGNOSIS — R0602 Shortness of breath: Secondary | ICD-10-CM | POA: Diagnosis not present

## 2022-09-07 DIAGNOSIS — Z794 Long term (current) use of insulin: Secondary | ICD-10-CM | POA: Diagnosis not present

## 2022-09-07 DIAGNOSIS — I509 Heart failure, unspecified: Secondary | ICD-10-CM | POA: Diagnosis not present

## 2022-09-07 DIAGNOSIS — Z7901 Long term (current) use of anticoagulants: Secondary | ICD-10-CM | POA: Insufficient documentation

## 2022-09-07 DIAGNOSIS — E119 Type 2 diabetes mellitus without complications: Secondary | ICD-10-CM | POA: Insufficient documentation

## 2022-09-07 DIAGNOSIS — J449 Chronic obstructive pulmonary disease, unspecified: Secondary | ICD-10-CM | POA: Diagnosis not present

## 2022-09-07 DIAGNOSIS — R059 Cough, unspecified: Secondary | ICD-10-CM | POA: Diagnosis not present

## 2022-09-07 LAB — CBC WITH DIFFERENTIAL/PLATELET
Abs Immature Granulocytes: 0.06 10*3/uL (ref 0.00–0.07)
Basophils Absolute: 0.1 10*3/uL (ref 0.0–0.1)
Basophils Relative: 1 %
Eosinophils Absolute: 0.1 10*3/uL (ref 0.0–0.5)
Eosinophils Relative: 2 %
HCT: 45.1 % (ref 39.0–52.0)
Hemoglobin: 13.8 g/dL (ref 13.0–17.0)
Immature Granulocytes: 1 %
Lymphocytes Relative: 36 %
Lymphs Abs: 1.9 10*3/uL (ref 0.7–4.0)
MCH: 27.3 pg (ref 26.0–34.0)
MCHC: 30.6 g/dL (ref 30.0–36.0)
MCV: 89.3 fL (ref 80.0–100.0)
Monocytes Absolute: 0.3 10*3/uL (ref 0.1–1.0)
Monocytes Relative: 6 %
Neutro Abs: 2.9 10*3/uL (ref 1.7–7.7)
Neutrophils Relative %: 54 %
Platelets: 254 10*3/uL (ref 150–400)
RBC: 5.05 MIL/uL (ref 4.22–5.81)
RDW: 15.1 % (ref 11.5–15.5)
WBC: 5.4 10*3/uL (ref 4.0–10.5)
nRBC: 0 % (ref 0.0–0.2)

## 2022-09-07 LAB — BRAIN NATRIURETIC PEPTIDE: B Natriuretic Peptide: 19.1 pg/mL (ref 0.0–100.0)

## 2022-09-07 LAB — COMPREHENSIVE METABOLIC PANEL
ALT: 28 U/L (ref 0–44)
AST: 32 U/L (ref 15–41)
Albumin: 3.6 g/dL (ref 3.5–5.0)
Alkaline Phosphatase: 90 U/L (ref 38–126)
Anion gap: 12 (ref 5–15)
BUN: 20 mg/dL (ref 6–20)
CO2: 24 mmol/L (ref 22–32)
Calcium: 8.8 mg/dL — ABNORMAL LOW (ref 8.9–10.3)
Chloride: 102 mmol/L (ref 98–111)
Creatinine, Ser: 1.32 mg/dL — ABNORMAL HIGH (ref 0.61–1.24)
GFR, Estimated: >60 mL/min (ref >60)
Glucose, Bld: 198 mg/dL — ABNORMAL HIGH (ref 70–99)
Potassium: 4 mmol/L (ref 3.5–5.1)
Sodium: 138 mmol/L (ref 135–145)
Total Bilirubin: 0.8 mg/dL (ref 0.3–1.2)
Total Protein: 7.8 g/dL (ref 6.5–8.1)

## 2022-09-07 LAB — RESP PANEL BY RT-PCR (RSV, FLU A&B, COVID)  RVPGX2
Influenza A by PCR: NEGATIVE
Influenza B by PCR: NEGATIVE
Resp Syncytial Virus by PCR: NEGATIVE
SARS Coronavirus 2 by RT PCR: NEGATIVE

## 2022-09-07 MED ORDER — ALBUTEROL SULFATE HFA 108 (90 BASE) MCG/ACT IN AERS
2.0000 | INHALATION_SPRAY | Freq: Four times a day (QID) | RESPIRATORY_TRACT | Status: DC
  Administered 2022-09-07: 2 via RESPIRATORY_TRACT
  Filled 2022-09-07: qty 6.7

## 2022-09-07 MED ORDER — METHYLPREDNISOLONE SODIUM SUCC 125 MG IJ SOLR
125.0000 mg | Freq: Once | INTRAMUSCULAR | Status: AC
  Administered 2022-09-07: 125 mg via INTRAVENOUS
  Filled 2022-09-07: qty 2

## 2022-09-07 MED ORDER — PREDNISONE 20 MG PO TABS: 40.0000 mg | ORAL_TABLET | Freq: Every day | ORAL | 0 refills | Status: DC

## 2022-09-07 MED ORDER — ALBUTEROL SULFATE (2.5 MG/3ML) 0.083% IN NEBU
5.0000 mg | INHALATION_SOLUTION | Freq: Once | RESPIRATORY_TRACT | Status: AC
  Administered 2022-09-07: 5 mg via RESPIRATORY_TRACT
  Filled 2022-09-07: qty 6

## 2022-09-07 NOTE — ED Triage Notes (Signed)
Pt BIB [facility] from home C/O productive cough X 2 weeks. Pt with trach. Endorses chest tightness with coughing.   150/76 70 98%  CBG 178

## 2022-09-07 NOTE — ED Notes (Signed)
Rt at bedside

## 2022-09-07 NOTE — ED Provider Notes (Signed)
[place] EMERGENCY DEPARTMENT AT [facility] Provider Note   CSN: [id number] Arrival date & time: 09/07/22  1028     History  Chief Complaint  Patient presents with   Cough    Timothy Le is a 62 y.o. male.  HPI patient presents with cough, generalized discomfort. Patient has multiple medical issues including trach, heart failure, diabetes, obesity.  He notes that over the past week he has been progressively weaker, with persistent cough.  Twice over the week his trach has been displaced.  First event was rectified by EMS, the patient replaced the trach after the second dislodgment himself.  He presents today with worsening overall condition.  No focal pain, no fever, no vomiting.     Home Medications Prior to Admission medications   Medication Sig Start Date End Date Taking? Authorizing Provider  predniSONE (DELTASONE) 20 MG tablet Take 2 tablets (40 mg total) by mouth daily with breakfast. For the next four days 09/07/22  Yes Gerhard Munch, MD  ACCU-CHEK GUIDE test strip TEST 1 DAILY IN THE AFTERNOON. USE AS INSTRUCTED 09/15/21   Shamleffer, Konrad Dolores, MD  allopurinol (ZYLOPRIM) 100 MG tablet TAKE 1 TABLET BY MOUTH EVERY DAY 07/10/22   [name], Baird Kay, FNP  atorvastatin (LIPITOR) 10 MG tablet Take 1 tablet (10 mg total) by mouth daily. 04/03/22   Donell Beers, FNP  BD INSULIN SYRINGE U/F 31G X 5/16" 0.5 ML MISC USE FOR DIABETIC TESTING 03/06/20   [provider]  blood glucose meter kit and supplies Dispense based on patient and insurance preference. Use up to four times daily as directed. (FOR ICD-10 E10.9, E11.9). 05/02/21   Passmore, [name] I, NP  diclofenac Sodium (VOLTAREN) 1 % GEL Apply 2 g topically 4 (four) times daily. 10/12/19   Barbette Merino, NP  ELIQUIS 5 MG TABS tablet TAKE 1 TABLET BY MOUTH TWICE A DAY 06/16/22   [name], Baird Kay, FNP  empagliflozin (JARDIANCE) 25 MG TABS tablet Take 1 tablet (25 mg total) by mouth daily  before breakfast. 04/03/22   [name], Baird Kay, FNP  gabapentin (NEURONTIN) 100 MG capsule TAKE 2 CAPSULES BY MOUTH 3 TIMES DAILY. 06/16/22   [name], Baird Kay, FNP  insulin glargine (LANTUS SOLOSTAR) 100 UNIT/ML Solostar Pen Inject 44 Units into the skin 2 (two) times daily. 04/03/22   Donell Beers, FNP  Insulin Pen Needle 29G X MISC 1 Device by Does not apply route in the morning and at bedtime. 09/09/21   Shamleffer, Konrad Dolores, MD  KLOR-CON M20 20 MEQ tablet TAKE 1 TABLET BY MOUTH TWICE A DAY 06/16/22   [name], Baird Kay, FNP  metFORMIN (GLUCOPHAGE-XR) 500 MG 24 hr tablet Take 1 tablet (500 mg total) by mouth in the morning and at bedtime. 09/09/21   Shamleffer, Konrad Dolores, MD  metoprolol tartrate (LOPRESSOR) 25 MG tablet TAKE 1/2 TABLET TWICE A DAY BY MOUTH 06/16/22   [name], Baird Kay, FNP  mometasone-formoterol (DULERA) 100-5 MCG/ACT AERO INHALE 2 PUFFS INTO THE LUNGS TWICE A DAY 04/03/22   [name], Baird Kay, FNP  pantoprazole (PROTONIX) 40 MG tablet Take 1 tablet (40 mg total) by mouth at bedtime. 01/23/21 04/03/22  Barbette Merino, NP  Phenylephrine-DM-GG (MUCINEX FAST-MAX CONGEST COUGH) 2.5-5-100 MG/5ML LIQD Take 20 mLs by mouth daily as needed (cough and chest congestion).    [provider]  QUEtiapine (SEROQUEL) 100 MG tablet TAKE 1 TABLET BY MOUTH TWICE A DAY 06/16/22   Donell Beers, FNP  senna-docusate (SENOKOT-S) 8.6-50 MG tablet Take 2 tablets by mouth at bedtime as needed for moderate constipation. 01/11/21   Amin, Ankit C, MD  sertraline (ZOLOFT) 100 MG tablet Take 1 tablet (100 mg total) by mouth daily. 04/03/22   Donell Beers, FNP  torsemide (DEMADEX) 10 MG tablet TAKE 1 TABLET BY MOUTH EVERY DAY 06/16/22   Donell Beers, FNP      Allergies    Patient has no known allergies.    Review of Systems   Review of Systems  All other systems reviewed and are negative.   Physical Exam Updated Vital Signs BP (!) 118/55   Pulse (!)  56   Temp 98.8 F (37.1 C) (Oral)   Resp 14   Ht 5\' 8"  (1.727 m)   Wt (!) 154.2 kg   SpO2 100%   BMI 51.70 kg/m  Physical Exam Vitals and nursing note reviewed.  Constitutional:      General: He is not in acute distress.    Appearance: He is well-developed. He is obese.  HENT:     Head: Normocephalic and atraumatic.   Eyes:     Conjunctiva/sclera: Conjunctivae normal.  Cardiovascular:     Rate and Rhythm: Normal rate and regular rhythm.  Pulmonary:     Effort: Tachypnea present. No respiratory distress.  Abdominal:     General: There is no distension.  Skin:    General: Skin is warm and dry.  Neurological:     Mental Status: He is alert and oriented to person, place, and time.     ED Results / Procedures / Treatments   Labs (all labs ordered are listed, but only abnormal results are displayed) Labs Reviewed  COMPREHENSIVE METABOLIC PANEL - Abnormal; Notable for the following components:      Result Value   Glucose, Bld 198 (*)    Creatinine, Ser 1.32 (*)    Calcium 8.8 (*)    All other components within normal limits  RESP PANEL BY RT-PCR (RSV, FLU A&B, COVID)  RVPGX2  BRAIN NATRIURETIC PEPTIDE  CBC WITH DIFFERENTIAL/PLATELET    EKG EKG Interpretation Date/Time:  Monday September 07 2022 11:09:04 EDT Ventricular Rate:  61 PR Interval:  212 QRS Duration:  107 QT Interval:  405 QTC Calculation: 408 R Axis:   47  Text Interpretation: Sinus rhythm Prolonged PR interval Low voltage, precordial leads Confirmed by Gerhard Munch [phone]) on 09/07/2022 12:52:31 PM  Radiology DG Chest Port 1 View  Result Date: 09/07/2022 CLINICAL DATA:  Shortness of breath EXAM: PORTABLE CHEST 1 VIEW COMPARISON:  09/19/2019 FINDINGS: Tracheostomy tube tip at the level of the clavicular heads. No focal airspace consolidation or pulmonary edema. No pneumothorax or sizable pleural effusion. IMPRESSION: No acute airspace disease. Electronically Signed   By: Deatra Robinson M.D.   On:  09/07/2022 14:48    Procedures Procedures    Medications Ordered in ED Medications  methylPREDNISolone sodium succinate (SOLU-MEDROL) 125 mg/2 mL injection 125 mg (has no administration in time range)  albuterol (VENTOLIN HFA) 108 (90 Base) MCG/ACT inhaler 2 puff (has no administration in time range)  albuterol (PROVENTIL) (2.5 MG/3ML) 0.083% nebulizer solution 5 mg (5 mg Nebulization Given 09/07/22 1117)    ED Course/ Medical Decision Making/ A&P                                 Medical Decision Making Obese adult male with diabetes, COPD, tracheostomy  presents with cough, fatigue.  Patient is afebrile, awake and alert, but with increased work of breathing, tachypnea, differential including infection, pneumonia, heart failure considered.  Cardiac 90 sinus normal Pulse ox 95% room air borderline   Amount and/or Complexity of Data Reviewed External Data Reviewed: notes. Labs: ordered. Decision-making details documented in ED Course. Radiology: ordered and independent interpretation performed. Decision-making details documented in ED Course. ECG/medicine tests: ordered and independent interpretation performed. Decision-making details documented in ED Course.  Risk Prescription drug management. Decision regarding hospitalization. Diagnosis or treatment significantly limited by social determinants of health.   Patient awake, alert, in similar condition.  Labs reviewed, x-ray reviewed.  After my initial evaluation the patient had suctioning, tracheostomy chart from our respiratory therapists, and they note that after suctioning patient's breathing improved. Patient has received bronchodilator, and with no hypoxia, no increased oxygen requirement, no evidence for decompensated heart failure, no pneumonia, suspicion for multifactorial etiology likely due to congestion, COPD exacerbation, patient will start steroids, increased frequency of bronchodilators, follow-up with primary  care.        Final Clinical Impression(s) / ED Diagnoses Final diagnoses:  SOB (shortness of breath)    Rx / DC Orders ED Discharge Orders          Ordered    predniSONE (DELTASONE) 20 MG tablet  Daily with breakfast        09/07/22 1511              Gerhard Munch, MD 09/07/22 1511

## 2022-09-07 NOTE — Discharge Instructions (Addendum)
Today's evaluation has been generally reassuring.  There is no evidence for COVID, pneumonia, or worsening heart failure.  There are some suspicion for your difficulty breathing being due to your COPD, and the tracheostomy repositioning.  Please take steroids as prescribed for the next 4 days and use the provided albuterol every 4 hours for the next 2 days, then as needed.  Return here for concerning changes in your condition.

## 2022-09-08 ENCOUNTER — Other Ambulatory Visit: Payer: Self-pay | Admitting: Internal Medicine

## 2022-09-08 ENCOUNTER — Other Ambulatory Visit: Payer: Self-pay | Admitting: Nurse Practitioner

## 2022-09-08 DIAGNOSIS — M109 Gout, unspecified: Secondary | ICD-10-CM

## 2022-09-08 DIAGNOSIS — F418 Other specified anxiety disorders: Secondary | ICD-10-CM

## 2022-09-08 DIAGNOSIS — E1100 Type 2 diabetes mellitus with hyperosmolarity without nonketotic hyperglycemic-hyperosmolar coma (NKHHC): Secondary | ICD-10-CM

## 2022-09-08 DIAGNOSIS — I5032 Chronic diastolic (congestive) heart failure: Secondary | ICD-10-CM

## 2022-09-08 NOTE — Telephone Encounter (Signed)
Please advise pt was called to advise of the need for a follow up appt. Kh

## 2022-09-10 ENCOUNTER — Telehealth: Payer: Self-pay

## 2022-09-10 NOTE — Transitions of Care (Post Inpatient/ED Visit) (Cosign Needed)
   09/10/2022  Name: Timothy Le MRN: [id number] DOB: 1960/12/05  Today's TOC FU Call Status: Today's TOC FU Call Status:: Successful TOC FU Call Completed TOC FU Call Complete Date: 09/10/22  Transition Care Management Follow-up Telephone Call Date of Discharge: 09/07/22 Discharge Facility: [facility]) Type of Discharge: Emergency Department Reason for ED Visit: Other: How have you been since you were released from the hospital?: Better Any questions or concerns?: No  Items Reviewed: Did you receive and understand the discharge instructions provided?: Yes Medications obtained,verified, and reconciled?: Yes (Medications Reviewed) Any new allergies since your discharge?: No Dietary orders reviewed?: NA Do you have support at home?: Yes People in Home: other relative(s)  Medications Reviewed Today: Medications Reviewed Today   Medications were not reviewed in this encounter     Home Care and Equipment/Supplies: Were Home Health Services Ordered?: NA Any new equipment or medical supplies ordered?: NA  Functional Questionnaire: Do you need assistance with bathing/showering or dressing?: No Do you need assistance with meal preparation?: No Do you need assistance with eating?: No Do you have difficulty maintaining continence: No Do you need assistance with getting out of bed/getting out of a chair/moving?: No Do you have difficulty managing or taking your medications?: No  Follow up appointments reviewed: PCP Follow-up appointment confirmed?: Yes Date of PCP follow-up appointment?: 09/18/22 Follow-up Provider: [facility] Follow-up appointment confirmed?: NA Do you need transportation to your follow-up appointment?: No Do you understand care options if your condition(s) worsen?: Yes-patient verbalized understanding  SDOH Interventions Today    Flowsheet Row Most Recent Value  SDOH Interventions   Food Insecurity Interventions Intervention Not  Indicated       SIGNATURE. Renelda Loma RMA

## 2022-09-18 ENCOUNTER — Ambulatory Visit (INDEPENDENT_AMBULATORY_CARE_PROVIDER_SITE_OTHER): Payer: Medicaid Other | Admitting: Nurse Practitioner

## 2022-09-18 ENCOUNTER — Other Ambulatory Visit: Payer: Self-pay | Admitting: Nurse Practitioner

## 2022-09-18 ENCOUNTER — Encounter: Payer: Self-pay | Admitting: Nurse Practitioner

## 2022-09-18 VITALS — BP 116/60 | HR 66 | Resp 14 | Wt 331.0 lb

## 2022-09-18 DIAGNOSIS — I5032 Chronic diastolic (congestive) heart failure: Secondary | ICD-10-CM | POA: Diagnosis not present

## 2022-09-18 DIAGNOSIS — J449 Chronic obstructive pulmonary disease, unspecified: Secondary | ICD-10-CM

## 2022-09-18 DIAGNOSIS — E1100 Type 2 diabetes mellitus with hyperosmolarity without nonketotic hyperglycemic-hyperosmolar coma (NKHHC): Secondary | ICD-10-CM | POA: Diagnosis not present

## 2022-09-18 DIAGNOSIS — Z930 Tracheostomy status: Secondary | ICD-10-CM

## 2022-09-18 DIAGNOSIS — M25562 Pain in left knee: Secondary | ICD-10-CM | POA: Diagnosis not present

## 2022-09-18 DIAGNOSIS — Z09 Encounter for follow-up examination after completed treatment for conditions other than malignant neoplasm: Secondary | ICD-10-CM | POA: Insufficient documentation

## 2022-09-18 DIAGNOSIS — E785 Hyperlipidemia, unspecified: Secondary | ICD-10-CM

## 2022-09-18 DIAGNOSIS — G8929 Other chronic pain: Secondary | ICD-10-CM | POA: Diagnosis not present

## 2022-09-18 DIAGNOSIS — J9611 Chronic respiratory failure with hypoxia: Secondary | ICD-10-CM | POA: Diagnosis not present

## 2022-09-18 LAB — POCT GLYCOSYLATED HEMOGLOBIN (HGB A1C): Hemoglobin A1C: 7.1 % — AB (ref 4.0–5.6)

## 2022-09-18 MED ORDER — KETOROLAC TROMETHAMINE 30 MG/ML IJ SOLN
30.0000 mg | Freq: Once | INTRAMUSCULAR | Status: AC
Start: 2022-09-18 — End: 2022-09-18
  Administered 2022-09-18: 30 mg via INTRAMUSCULAR

## 2022-09-18 MED ORDER — POTASSIUM CHLORIDE CRYS ER 20 MEQ PO TBCR: 20.0000 meq | EXTENDED_RELEASE_TABLET | Freq: Once | ORAL | 1 refills | Status: DC

## 2022-09-18 MED ORDER — BD INSULIN SYRINGE U/F 31G X 5/16" 0.5 ML MISC
1.0000 | Freq: Two times a day (BID) | 3 refills | Status: DC
Start: 2022-09-18 — End: 2023-02-09

## 2022-09-18 MED ORDER — ALBUTEROL SULFATE HFA 108 (90 BASE) MCG/ACT IN AERS
2.0000 | INHALATION_SPRAY | Freq: Four times a day (QID) | RESPIRATORY_TRACT | 4 refills | Status: DC | PRN
Start: 2022-09-18 — End: 2023-02-11

## 2022-09-18 MED ORDER — ACCU-CHEK GUIDE VI STRP
ORAL_STRIP | 3 refills | Status: DC
Start: 2022-09-18 — End: 2023-02-09

## 2022-09-18 MED ORDER — METFORMIN HCL ER 500 MG PO TB24
500.0000 mg | ORAL_TABLET | Freq: Two times a day (BID) | ORAL | 3 refills | Status: DC
Start: 2022-09-18 — End: 2023-02-11

## 2022-09-18 MED ORDER — INSULIN PEN NEEDLE 29G X 12MM MISC
1.0000 | Freq: Two times a day (BID) | 3 refills | Status: DC
Start: 2022-09-18 — End: 2023-02-09

## 2022-09-18 NOTE — Progress Notes (Signed)
Integrated Behavioral Health Referral Note  09/18/2022 Name: Timothy Le MRN: [id number] DOB: 1960-11-11 Timothy Le is a 62 y.o. year old male who sees [name], Baird Kay, FNP for primary care. LCSW was consulted to assess patient's needs and assist the patient with [employer] .  Interpreter: No.   Interpreter Name & Language: none  Assessment: Patient experiencing Housing barriers.   Intervention: CSW met with patient after PCP visit today. Patient reported that he is about to lose his housing. He reported his lease is month to month and he has a Section 8 voucher. His landlord is declining to renew his lease and he will have to be out by 10/04/22. He currently does not have another place to go. His sister is helping him communicate with the housing authority so he can hopefully use his voucher elsewhere. There are repair needs at the apartment that patient has reported to the property management and they have declined to fix. Referred patient to Legal Aid for assistance. Ideally we would hope he could have some assistance to stay in the apartment until he can find other suitable housing. Otherwise, he reports if he is put out on the 15th, he won't have anywhere else to go. Due to his health conditions, it would be more ideal for him to move directly to another apartment. Also provided patient with some food from the [facility]) food pantry, as he reported he does not have food at home.   SDOH (Social Determinants of Health) assessments performed:   SDOH Screenings   Food Insecurity: Food Insecurity Present (09/18/2022)  Housing: High Risk (09/18/2022)  Depression (PHQ2-9): Low Risk  (09/18/2022)  Tobacco Use: Medium Risk (09/18/2022)   Review of patient status, including review of consultants reports, relevant laboratory and other test results, and collaboration with appropriate care team members and the patient's provider was performed as part of comprehensive  patient evaluation and provision of services.    Abigail Butts, LCSW [facility] [phone]

## 2022-09-18 NOTE — Patient Instructions (Addendum)
Goal for fasting blood sugar ranges from 80 to 120 and 2 hours after any meal or at bedtime should be between 130 to 170.   You were given toradol 30mg  injection today for your knee pain   Please get your Tdap vaccine and your shingles vaccine at the pharmacy  Please get your Cologuard test completed as discussed  You have been referred to podiatry for footcare and possibly  get a diabetic shoe  Please follow-up with your eye doctor for your diabetic eye exam as discussed   It is very important that you follow-up with cardiology and the pulmonology as discussed today  It is important that you exercise regularly at least 30 minutes 5 times a week as tolerated  Think about what you will eat, plan ahead. Choose " clean, green, fresh or frozen" over canned, processed or packaged foods which are more sugary, salty and fatty. 70 to 75% of food eaten should be vegetables and fruit. Three meals at set times with snacks allowed between meals, but they must be fruit or vegetables. Aim to eat over a 12 hour period , example 7 am to 7 pm, and STOP after  your last meal of the day. Drink water,generally about 64 ounces per day, no other drink is as healthy. Fruit juice is best enjoyed in a healthy way, by EATING the fruit.  Thanks for choosing [facility] we consider it a privelige to serve you.

## 2022-09-18 NOTE — Assessment & Plan Note (Signed)
ER visit summary labs imaging studies and recommendations reviewed by me today

## 2022-09-18 NOTE — Assessment & Plan Note (Signed)
Appointment scheduled for cardiology follow-up today Continue Jardiance 25 mg daily, metoprolol 12.5 mg twice daily, torsemide 10 mg daily No edema noted on examination today  Lab Results  Component Value Date   NA 138 09/07/2022   K 4.0 09/07/2022   CO2 24 09/07/2022   GLUCOSE 198 (H) 09/07/2022   BUN 20 09/07/2022   CREATININE 1.32 (H) 09/07/2022   CALCIUM 8.8 (L) 09/07/2022   GFR 58.31 (L) 09/09/2021   EGFR 68 04/03/2022   GFRNONAA >60 09/07/2022

## 2022-09-18 NOTE — Assessment & Plan Note (Addendum)
Toradol 30 mg IM injection given Continue gabapentin 200 mg 3 times daily Encouraged to take OTCTylenol as needed Patient referred to orthopedics Application of heat and ice encouraged

## 2022-09-18 NOTE — Assessment & Plan Note (Signed)
>>  ASSESSMENT AND PLAN FOR TYPE 2 DIABETES MELLITUS WITH HYPEROSMOLAR NONKETOTIC HYPERGLYCEMIA (HCC) WRITTEN ON 09/18/2022 12:22 PM BY Roan Sawchuk, Baird Kay, FNP  Lab Results  Component Value Date   HGBA1C 7.1 (A) 09/18/2022  Continue Jardiance 25 mg daily, metformin 500 mg twice daily, Lantus 44 units twice daily Patient denies hypoglycemia Diabetic foot exam completed today, has no sensation on examination with a monofilament, patient will need diabetic shoes, he was referred to podiatry today Encouraged to follow-up with ophthalmologist for diabetic eye exam Continue atorvastatin 10 mg daily for hyperlipidemia, checking lipid panel today

## 2022-09-18 NOTE — Assessment & Plan Note (Addendum)
Wt Readings from Last 3 Encounters:  09/18/22 (!) 331 lb (150.1 kg)  09/07/22 (!) 340 lb (154.2 kg)  04/03/22 (!) 338 lb 6.4 oz (153.5 kg)   Body mass index is 50.33 kg/m.  Patient counseled on low-carb modified diet Encouraged to engage in regular moderate exercises at least 150 minutes weekly as tolerated.

## 2022-09-18 NOTE — Assessment & Plan Note (Addendum)
Lab Results  Component Value Date   HGBA1C 7.1 (A) 09/18/2022  Continue Jardiance 25 mg daily, metformin 500 mg twice daily, Lantus 44 units twice daily Patient denies hypoglycemia Diabetic foot exam completed today, has no sensation on examination with a monofilament, patient will need diabetic shoes, he was referred to podiatry today Encouraged to follow-up with ophthalmologist for diabetic eye exam Continue atorvastatin 10 mg daily for hyperlipidemia, checking lipid panel today

## 2022-09-18 NOTE — Assessment & Plan Note (Addendum)
Continue albuterol 2 puffs every 6 hours as needed Dulera 100-5 mcg, 2 puffs twice daily Was treated with prednisone tablets recently States that his breathing is much better Patient referred to pulmonology

## 2022-09-18 NOTE — Assessment & Plan Note (Addendum)
Has not been going to the trach clinic and he does not have their phone number .has had trach dislodged recently after which he went to the ER, trachea was provided at emergency room He has lost follow-up with pulmonology He was referred to pulmonology at his last visit but he did not follow-up with them yet, need to follow-up with pulmonology discussed New referral placed today  Review of notes written by pulmonology in 2021  states   '' Follows with Tracheostomy Clinic- Anders Simmonds NP told him to expect to need trach another 6 months, which will be largely contingent on his ability to keep weight down. ''  Janit Bern , the referral coordinator was able to get pt an appointment with [facility] ENT specialist to have their provider eval his trach, she  left a VM for the patient. hoping to hear back from him by next week

## 2022-09-18 NOTE — Progress Notes (Signed)
Pt presents for hospital f/u -needs refill on insulin -needs social worker advice states he is getting kicked out of apartment on 09/15

## 2022-09-18 NOTE — Progress Notes (Signed)
Established Patient Office Visit  Subjective:  Patient ID: Timothy Le, male    DOB: 02-12-1960  Age: 62 y.o. MRN: [id number]  CC:  Chief Complaint  Patient presents with   Hospitalization Follow-up    HPI Timothy Le is a 62 y.o. male  has a past medical history of Asthma, Chronic respiratory failure with hypoxia (HCC) (07/09/2017), COPD (chronic obstructive pulmonary disease) (HCC), Diabetes mellitus without complication (HCC), Difficult intubation, Hypertension, Severe obesity (BMI >= 40) (HCC), Shortness of breath dyspnea, Sleep apnea, and Tracheobronchitis.    Patient denies any adverse reactions to current medication Stated that he has not been following up regularly because he had problems with transportation.  He just got his bus pass back and plans to be following up regularly.  Patient stated that his breathing has been better since his recent visit to the emergency room.  He has normal trach supplies at home.  Has been using albuterol inhaler as needed, Dulera 2 puffs twice daily. Patient denies fever, chills, wheezing, abdominal pain nausea vomiting  Patient complains of chronic left knee pain, stated that he has had surgery twice in the past.  Has been taking Tylenol without improvement.  He would like referral to orthopedics.    Past Medical History:  Diagnosis Date   Asthma    Chronic respiratory failure with hypoxia (HCC) 07/09/2017   COPD (chronic obstructive pulmonary disease) (HCC)    Diabetes mellitus without complication (HCC)    Difficult intubation    Hypertension    Severe obesity (BMI >= 40) (HCC)    Shortness of breath dyspnea    Sleep apnea    Tracheobronchitis     Past Surgical History:  Procedure Laterality Date   CORONARY ULTRASOUND/IVUS Left 12/11/2019   Procedure: Intravascular Ultrasound/IVUS;  Surgeon: Maeola Harman, MD;  Location: [facility] INVASIVE CV LAB;  Service: Cardiovascular;  Laterality: Left;  Left lower extremity  venous   IR GASTROSTOMY TUBE MOD SED  07/05/2017   IVC VENOGRAPHY N/A 01/10/2021   Procedure: IVC VENOGRAPHY - VENA CAVA;  Surgeon: Chuck Hint, MD;  Location: [facility] INVASIVE CV LAB;  Service: Cardiovascular;  Laterality: N/A;   LOWER EXTREMITY VENOGRAPHY N/A 01/10/2021   Procedure: LOWER EXTREMITY VENOGRAPHY;  Surgeon: Chuck Hint, MD;  Location: [facility] INVASIVE CV LAB;  Service: Cardiovascular;  Laterality: N/A;   PERIPHERAL VASCULAR THROMBECTOMY Left 12/11/2019   Procedure: PERIPHERAL VASCULAR THROMBECTOMY;  Surgeon: Maeola Harman, MD;  Location: [facility] INVASIVE CV LAB;  Service: Cardiovascular;  Laterality: Left;   THYROIDECTOMY Left 08/30/2019   Procedure: THYROIDECTOMY CERVICAL LIPECTOMY;  Surgeon: Serena Colonel, MD;  Location: [facility] OR;  Service: ENT;  Laterality: Left;  NEEDS RNFA PLEASE   TRACHEOSTOMY TUBE PLACEMENT N/A 06/17/2017   Procedure: TRACHEOSTOMY;  Surgeon: Newman Pies, MD;  Location: [facility] OR;  Service: ENT;  Laterality: N/A;   TRACHEOSTOMY TUBE PLACEMENT N/A 08/14/2019   Procedure: TRACHEOSTOMY;  Surgeon: Serena Colonel, MD;  Location: [facility] OR;  Service: ENT;  Laterality: N/A;   TRACHEOSTOMY TUBE PLACEMENT N/A 08/30/2019   Procedure: TRACHEOSTOMY  REVISION;  Surgeon: Serena Colonel, MD;  Location: [facility] OR;  Service: ENT;  Laterality: N/A;    Family History  Problem Relation Age of Onset   Asthma Sister    Asthma Other        nephew   Hypertension Sister    Diabetes type II Sister     Social History   Socioeconomic History   Marital status: Legally Separated  Spouse name: Not on file   Number of children: 1   Years of education: Not on file   Highest education level: Not on file  Occupational History   Occupation: unemployed    Comment: working on disablity  Tobacco Use   Smoking status: Former    Current packs/day: 0.00    Average packs/day: 0.5 packs/day for 29.6 years (14.8 ttl pk-yrs)    Types: Cigarettes    Start date: 07/28/1983    Quit date:  02/27/2013    Years since quitting: 9.5   Smokeless tobacco: Never  Vaping Use   Vaping status: Never Used  Substance and Sexual Activity   Alcohol use: No    Comment: quit ETOH about 5 months ago-beer and liquor   Drug use: No    Comment: quit 6 months-"pot"-smoked couple joints a week   Sexual activity: Not Currently  Other Topics Concern   Not on file  Social History Narrative   Lives home alone   Social Determinants of Health   Financial Resource Strain: Not on file  Food Insecurity: Food Insecurity Present (09/18/2022)   Hunger Vital Sign    Worried About Running Out of Food in the Last Year: Often true    Ran Out of Food in the Last Year: Often true  Transportation Needs: Not on file  Physical Activity: Not on file  Stress: Not on file  Social Connections: Not on file  Intimate Partner Violence: Not At Risk (09/10/2022)   Humiliation, Afraid, Rape, and Kick questionnaire    Fear of Current or Ex-Partner: No    Emotionally Abused: No    Physically Abused: No    Sexually Abused: No    Outpatient Medications Prior to Visit  Medication Sig Dispense Refill   allopurinol (ZYLOPRIM) 100 MG tablet TAKE 1 TABLET BY MOUTH EVERY DAY 90 tablet 0   atorvastatin (LIPITOR) 10 MG tablet Take 1 tablet (10 mg total) by mouth daily. 90 tablet 0   blood glucose meter kit and supplies Dispense based on patient and insurance preference. Use up to four times daily as directed. (FOR ICD-10 E10.9, E11.9). 1 each 0   diclofenac Sodium (VOLTAREN) 1 % GEL Apply 2 g topically 4 (four) times daily. 150 g 5   ELIQUIS 5 MG TABS tablet TAKE 1 TABLET BY MOUTH TWICE A DAY 180 tablet 1   empagliflozin (JARDIANCE) 25 MG TABS tablet Take 1 tablet (25 mg total) by mouth daily before breakfast. 90 tablet 3   gabapentin (NEURONTIN) 100 MG capsule TAKE 2 CAPSULES BY MOUTH 3 TIMES DAILY. 540 capsule 0   insulin glargine (LANTUS SOLOSTAR) 100 UNIT/ML Solostar Pen Inject 44 Units into the skin 2 (two) times daily.  75 mL 6   metoprolol tartrate (LOPRESSOR) 25 MG tablet TAKE 1/2 TABLET TWICE A DAY BY MOUTH 90 tablet 1   mometasone-formoterol (DULERA) 100-5 MCG/ACT AERO INHALE 2 PUFFS INTO THE LUNGS TWICE A DAY 8.8 each 2   pantoprazole (PROTONIX) 40 MG tablet Take 1 tablet (40 mg total) by mouth at bedtime. 90 tablet 1   Phenylephrine-DM-GG (MUCINEX FAST-MAX CONGEST COUGH) 2.5-5-100 MG/5ML LIQD Take 20 mLs by mouth daily as needed (cough and chest congestion).     QUEtiapine (SEROQUEL) 100 MG tablet TAKE 1 TABLET BY MOUTH TWICE A DAY 180 tablet 1   senna-docusate (SENOKOT-S) 8.6-50 MG tablet Take 2 tablets by mouth at bedtime as needed for moderate constipation. 60 tablet 0   sertraline (ZOLOFT) 100 MG tablet TAKE 1  TABLET BY MOUTH EVERY DAY 90 tablet 0   torsemide (DEMADEX) 10 MG tablet TAKE 1 TABLET BY MOUTH EVERY DAY 90 tablet 0   ACCU-CHEK GUIDE test strip TEST 1 DAILY IN THE AFTERNOON. USE AS INSTRUCTED 100 strip 3   albuterol (VENTOLIN HFA) 108 (90 Base) MCG/ACT inhaler Inhale 2 puffs into the lungs every 6 (six) hours as needed for wheezing or shortness of breath.     BD INSULIN SYRINGE U/F 31G X 5/16" 0.5 ML MISC USE FOR DIABETIC TESTING     Insulin Pen Needle 29G X MISC 1 Device by Does not apply route in the morning and at bedtime. 200 each 3   KLOR-CON M20 20 MEQ tablet TAKE 1 TABLET BY MOUTH TWICE A DAY 180 tablet 1   metFORMIN (GLUCOPHAGE-XR) 500 MG 24 hr tablet Take 1 tablet (500 mg total) by mouth in the morning and at bedtime. 180 tablet 3   predniSONE (DELTASONE) 20 MG tablet Take 2 tablets (40 mg total) by mouth daily with breakfast. For the next four days (Patient not taking: Reported on 09/18/2022) 8 tablet 0   No facility-administered medications prior to visit.    No Known Allergies  ROS Review of Systems  Constitutional:  Negative for diaphoresis, fatigue, fever and unexpected weight change.  HENT:  Negative for congestion, dental problem, drooling and ear discharge.   Eyes:   Negative for pain, discharge, redness and itching.  Respiratory:  Negative for cough, choking, chest tightness, shortness of breath and wheezing.   Cardiovascular: Negative.  Negative for chest pain, palpitations and leg swelling.  Gastrointestinal:  Negative for abdominal distention, abdominal pain, anal bleeding, blood in stool, constipation and vomiting.  Endocrine: Negative for polydipsia, polyphagia and polyuria.  Genitourinary:  Negative for difficulty urinating, flank pain, frequency and genital sores.  Musculoskeletal:  Positive for arthralgias. Negative for back pain, gait problem and joint swelling.  Skin:  Negative for color change, pallor and rash.  Neurological:  Negative for facial asymmetry, light-headedness, numbness and headaches.  Psychiatric/Behavioral:  Negative for agitation, behavioral problems, confusion, hallucinations, self-injury, sleep disturbance and suicidal ideas.       Objective:    Physical Exam Vitals and nursing note reviewed.  Constitutional:      General: He is not in acute distress.    Appearance: Normal appearance. He is obese. He is not ill-appearing, toxic-appearing or diaphoretic.  HENT:     Mouth/Throat:     Mouth: Mucous membranes are moist.     Pharynx: Oropharynx is clear. No oropharyngeal exudate or posterior oropharyngeal erythema.  Eyes:     General: No scleral icterus.       Right eye: No discharge.        Left eye: No discharge.     Extraocular Movements: Extraocular movements intact.     Conjunctiva/sclera: Conjunctivae normal.  Cardiovascular:     Rate and Rhythm: Normal rate and regular rhythm.     Pulses: Normal pulses.     Heart sounds: Normal heart sounds. No murmur heard.    No friction rub. No gallop.  Pulmonary:     Effort: Pulmonary effort is normal. No respiratory distress.     Breath sounds: Normal breath sounds. No stridor. No wheezing, rhonchi or rales.     Comments: Tracheostomy capped , dressing clean and dry .   No sign of respiratory distress noted  Chest:     Chest wall: No tenderness.  Abdominal:     General: There is  no distension.     Palpations: Abdomen is soft.     Tenderness: There is no abdominal tenderness. There is no right CVA tenderness, left CVA tenderness or guarding.  Musculoskeletal:        General: No swelling, tenderness, deformity or signs of injury.     Right lower leg: No edema.     Left lower leg: No edema.  Skin:    General: Skin is warm and dry.     Capillary Refill: Capillary refill takes less than 2 seconds.     Coloration: Skin is not jaundiced or pale.     Findings: No bruising, erythema or lesion.  Neurological:     Mental Status: He is alert and oriented to person, place, and time.     Motor: No weakness.     Coordination: Coordination normal.     Gait: Gait normal.  Psychiatric:        Mood and Affect: Mood normal.        Behavior: Behavior normal.        Thought Content: Thought content normal.        Judgment: Judgment normal.     BP 116/60 (BP Location: Left Arm, Patient Position: Sitting, Cuff Size: Large)   Pulse 66   Resp 14   Wt (!) 331 lb (150.1 kg)   SpO2 100%   BMI 50.33 kg/m  Wt Readings from Last 3 Encounters:  09/18/22 (!) 331 lb (150.1 kg)  09/07/22 (!) 340 lb (154.2 kg)  04/03/22 (!) 338 lb 6.4 oz (153.5 kg)    Lab Results  Component Value Date   TSH 1.68 09/09/2021   Lab Results  Component Value Date   WBC 5.4 09/07/2022   HGB 13.8 09/07/2022   HCT 45.1 09/07/2022   MCV 89.3 09/07/2022   PLT 254 09/07/2022   Lab Results  Component Value Date   NA 138 09/07/2022   K 4.0 09/07/2022   CO2 24 09/07/2022   GLUCOSE 198 (H) 09/07/2022   BUN 20 09/07/2022   CREATININE 1.32 (H) 09/07/2022   BILITOT 0.8 09/07/2022   ALKPHOS 90 09/07/2022   AST 32 09/07/2022   ALT 28 09/07/2022   PROT 7.8 09/07/2022   ALBUMIN 3.6 09/07/2022   CALCIUM 8.8 (L) 09/07/2022   ANIONGAP 12 09/07/2022   EGFR 68 04/03/2022   GFR 58.31 (L)  09/09/2021   Lab Results  Component Value Date   CHOL 153 01/11/2021   Lab Results  Component Value Date   HDL 39 (L) 01/11/2021   Lab Results  Component Value Date   LDLCALC 96 01/11/2021   Lab Results  Component Value Date   TRIG 91 01/11/2021   Lab Results  Component Value Date   CHOLHDL 3.9 01/11/2021   Lab Results  Component Value Date   HGBA1C 7.1 (A) 09/18/2022      Assessment & Plan:   Problem List Items Addressed This Visit       Cardiovascular and Mediastinum   Chronic diastolic CHF (congestive heart failure) [employer])    Appointment scheduled for cardiology follow-up today Continue Jardiance 25 mg daily, metoprolol 12.5 mg twice daily, torsemide 10 mg daily No edema noted on examination today  Lab Results  Component Value Date   NA 138 09/07/2022   K 4.0 09/07/2022   CO2 24 09/07/2022   GLUCOSE 198 (H) 09/07/2022   BUN 20 09/07/2022   CREATININE 1.32 (H) 09/07/2022   CALCIUM 8.8 (L) 09/07/2022   GFR 58.31 (  L) 09/09/2021   EGFR 68 04/03/2022   GFRNONAA >60 09/07/2022           Respiratory   COPD (chronic obstructive pulmonary disease) (HCC)    Continue albuterol 2 puffs every 6 hours as needed Dulera 100-5 mcg, 2 puffs twice daily Was treated with prednisone tablets recently States that his breathing is much better Patient referred to pulmonology       Relevant Medications   albuterol (VENTOLIN HFA) 108 (90 Base) MCG/ACT inhaler   Other Relevant Orders   Ambulatory referral to Pulmonology   Chronic respiratory failure with hypoxia [facility])   Relevant Orders   Ambulatory referral to Pulmonology   Trach visit     Endocrine   Type 2 diabetes mellitus with hyperosmolar nonketotic hyperglycemia (HCC) - Primary    Lab Results  Component Value Date   HGBA1C 7.1 (A) 09/18/2022  Continue Jardiance 25 mg daily, metformin 500 mg twice daily, Lantus 44 units twice daily Patient denies hypoglycemia Diabetic foot exam completed today, has no  sensation on examination with a monofilament, patient will need diabetic shoes, he was referred to podiatry today Encouraged to follow-up with ophthalmologist for diabetic eye exam Continue atorvastatin 10 mg daily for hyperlipidemia, checking lipid panel today      Relevant Medications   glucose blood (ACCU-CHEK GUIDE) test strip   BD INSULIN SYRINGE U/F 31G X 5/16" 0.5 ML MISC   Insulin Pen Needle 29G X MISC   metFORMIN (GLUCOPHAGE-XR) 500 MG 24 hr tablet   Other Relevant Orders   POCT glycosylated hemoglobin (Hb A1C) (Completed)   Lipid panel   Ambulatory referral to Podiatry     Other   Severe obesity (BMI >= 40) (HCC)    Wt Readings from Last 3 Encounters:  09/18/22 (!) 331 lb (150.1 kg)  09/07/22 (!) 340 lb (154.2 kg)  04/03/22 (!) 338 lb 6.4 oz (153.5 kg)   Body mass index is 50.33 kg/m.  Patient counseled on low-carb modified diet Encouraged to engage in regular moderate exercises at least 150 minutes weekly as tolerated.      Relevant Medications   metFORMIN (GLUCOPHAGE-XR) 500 MG 24 hr tablet   Tracheostomy dependence (HCC)    Has not been going to the trach clinic and he does not have their phone number .has had trach dislodged recently after which he went to the ER, trachea was provided at emergency room He has lost follow-up with pulmonology He was referred to pulmonology at his last visit but he did not follow-up with them yet, need to follow-up with pulmonology discussed New referral placed today  Review of notes written by pulmonology in 2021  states   '' Follows with Tracheostomy Clinic- Anders Simmonds NP told him to expect to need trach another 6 months, which will be largely contingent on his ability to keep weight down. ''  Janit Bern , the referral coordinator was able to get pt an appointment with [facility] ENT specialist to have their provider eval his trach, she  left a VM for the patient. hoping to hear back from him by next week       Relevant  Orders   Trach visit   Ambulatory referral to Home Health   Dyslipidemia, goal LDL below 70    Lab Results  Component Value Date   CHOL 153 01/11/2021   HDL 39 (L) 01/11/2021   LDLCALC 96 01/11/2021   TRIG 91 01/11/2021   CHOLHDL 3.9 01/11/2021  LDL goal is  less than 70 On atorvastatin 10 mg daily Checking lipid panel today      Chronic pain of left knee    Toradol 30 mg IM injection given Continue gabapentin 200 mg 3 times daily Encouraged to take OTCTylenol as needed Patient referred to orthopedics Application of heat and ice encouraged      Relevant Orders   Ambulatory referral to Orthopedic Surgery   Encounter for examination following treatment at hospital    ER visit summary labs imaging studies and recommendations reviewed by me today        Meds ordered this encounter  Medications   albuterol (VENTOLIN HFA) 108 (90 Base) MCG/ACT inhaler    Sig: Inhale 2 puffs into the lungs every 6 (six) hours as needed for wheezing or shortness of breath.    Dispense:  1 each    Refill:  4   glucose blood (ACCU-CHEK GUIDE) test strip    Sig: Use as instructed    Dispense:  100 strip    Refill:  3   BD INSULIN SYRINGE U/F 31G X 5/16" 0.5 ML MISC    Sig: Inject 1 each into the skin 2 (two) times daily.    Dispense:  180 each    Refill:  3   Insulin Pen Needle 29G X MISC    Sig: 1 Device by Does not apply route in the morning and at bedtime.    Dispense:  200 each    Refill:  3   metFORMIN (GLUCOPHAGE-XR) 500 MG 24 hr tablet    Sig: Take 1 tablet (500 mg total) by mouth in the morning and at bedtime.    Dispense:  180 tablet    Refill:  3   potassium chloride SA (KLOR-CON M20) 20 MEQ tablet    Sig: Take 1 tablet (20 mEq total) by mouth once for 1 dose.    Dispense:  180 tablet    Refill:  1    PATIENT NEEDS REFILL   ketorolac (TORADOL) 30 MG/ML injection 30 mg    Follow-up: Return in about 3 months (around 12/19/2022).    Donell Beers, FNP

## 2022-09-18 NOTE — Assessment & Plan Note (Signed)
Lab Results  Component Value Date   CHOL 153 01/11/2021   HDL 39 (L) 01/11/2021   LDLCALC 96 01/11/2021   TRIG 91 01/11/2021   CHOLHDL 3.9 01/11/2021  LDL goal is less than 70 On atorvastatin 10 mg daily Checking lipid panel today

## 2022-09-19 LAB — LIPID PANEL
Chol/HDL Ratio: 2.8 ratio (ref 0.0–5.0)
Cholesterol, Total: 132 mg/dL (ref 100–199)
HDL: 48 mg/dL (ref >39)
LDL Chol Calc (NIH): 70 mg/dL (ref 0–99)
Triglycerides: 66 mg/dL (ref 0–149)
VLDL Cholesterol Cal: 14 mg/dL (ref 5–40)

## 2022-09-22 ENCOUNTER — Institutional Professional Consult (permissible substitution) (INDEPENDENT_AMBULATORY_CARE_PROVIDER_SITE_OTHER): Payer: Medicaid Other | Admitting: Otolaryngology

## 2022-09-23 ENCOUNTER — Other Ambulatory Visit: Payer: Self-pay | Admitting: Nurse Practitioner

## 2022-09-24 ENCOUNTER — Other Ambulatory Visit: Payer: Self-pay | Admitting: Internal Medicine

## 2022-09-24 DIAGNOSIS — E1100 Type 2 diabetes mellitus with hyperosmolarity without nonketotic hyperglycemic-hyperosmolar coma (NKHHC): Secondary | ICD-10-CM

## 2022-09-25 ENCOUNTER — Telehealth: Payer: Self-pay

## 2022-09-25 ENCOUNTER — Other Ambulatory Visit: Payer: Self-pay | Admitting: Nurse Practitioner

## 2022-09-25 ENCOUNTER — Telehealth: Payer: Self-pay | Admitting: Physician Assistant

## 2022-09-25 DIAGNOSIS — E1100 Type 2 diabetes mellitus with hyperosmolarity without nonketotic hyperglycemic-hyperosmolar coma (NKHHC): Secondary | ICD-10-CM

## 2022-09-25 MED ORDER — LANTUS SOLOSTAR 100 UNIT/ML ~~LOC~~ SOPN
44.0000 [IU] | PEN_INJECTOR | Freq: Two times a day (BID) | SUBCUTANEOUS | 6 refills | Status: DC
Start: 2022-09-25 — End: 2023-02-09

## 2022-09-25 NOTE — Telephone Encounter (Signed)
Mailed reminder letter to patient today 

## 2022-09-25 NOTE — Telephone Encounter (Signed)
Caller & Relationship to patient:  MRN #  [id number]   Call Back Number:   Date of Last Office Visit: Visit date not found     Date of Next Office Visit: 10/14/2022    Medication(s) to be Refilled: Insulin  Preferred Pharmacy:   ** Please notify patient to allow 48-72 hours to process** **Let patient know to contact pharmacy at the end of the day to make sure medication is ready. ** **If patient has not been seen in a year or longer, book an appointment **Advise to use MyChart for refill requests OR to contact their pharmacy

## 2022-09-25 NOTE — Telephone Encounter (Signed)
Caller & Relationship to patient:  MRN #  [id number]   Call Back Number: [phone]  Date of Last Office Visit: 09/18/2022     Date of Next Office Visit: 12/21/2022    Medication(s) to be Refilled: Lantus  Preferred Pharmacy: [facility]  ** Please notify patient to allow 48-72 hours to process** **Let patient know to contact pharmacy at the end of the day to make sure medication is ready. ** **If patient has not been seen in a year or longer, book an appointment **Advise to use MyChart for refill requests OR to contact their pharmacy

## 2022-09-28 ENCOUNTER — Telehealth: Payer: Self-pay | Admitting: Nurse Practitioner

## 2022-09-28 ENCOUNTER — Other Ambulatory Visit: Payer: Self-pay

## 2022-09-28 NOTE — Telephone Encounter (Signed)
Requesting somene call him regarding medication

## 2022-09-28 NOTE — Telephone Encounter (Signed)
Was done 09/25/22

## 2022-09-28 NOTE — Telephone Encounter (Signed)
Lantus was sent in 09/25/22. KH

## 2022-09-28 NOTE — Telephone Encounter (Signed)
Caller & Relationship to patient:  MRN #  [id number]   Call Back Number:   Date of Last Office Visit: 09/28/2022     Date of Next Office Visit: 12/21/2022    Medication(s) to be Refilled: insulin   Preferred Pharmacy:   ** Please notify patient to allow 48-72 hours to process** **Let patient know to contact pharmacy at the end of the day to make sure medication is ready. ** **If patient has not been seen in a year or longer, book an appointment **Advise to use MyChart for refill requests OR to contact their pharmacy

## 2022-09-29 ENCOUNTER — Telehealth: Payer: Self-pay

## 2022-09-29 ENCOUNTER — Other Ambulatory Visit: Payer: Self-pay

## 2022-09-29 NOTE — Telephone Encounter (Signed)
Lvm advising pt to go back and try and pick up medication. KH

## 2022-09-29 NOTE — Telephone Encounter (Signed)
Done KH lvm for pt to go back to pharmacy and pick it up. KH

## 2022-09-29 NOTE — Telephone Encounter (Signed)
Pt lantus is not covered. Please advise if a P/A can be done . If denied we need to send insulin glargine bio similar. Thanks [employer]

## 2022-09-29 NOTE — Telephone Encounter (Signed)
Please see high priority message

## 2022-10-01 ENCOUNTER — Encounter: Payer: Self-pay | Admitting: Podiatry

## 2022-10-01 ENCOUNTER — Ambulatory Visit (INDEPENDENT_AMBULATORY_CARE_PROVIDER_SITE_OTHER): Payer: Medicaid Other | Admitting: Podiatry

## 2022-10-01 DIAGNOSIS — B351 Tinea unguium: Secondary | ICD-10-CM

## 2022-10-01 DIAGNOSIS — Z7901 Long term (current) use of anticoagulants: Secondary | ICD-10-CM

## 2022-10-01 DIAGNOSIS — M79674 Pain in right toe(s): Secondary | ICD-10-CM

## 2022-10-01 DIAGNOSIS — M79675 Pain in left toe(s): Secondary | ICD-10-CM | POA: Diagnosis not present

## 2022-10-01 NOTE — Progress Notes (Signed)
Subjective: Chief Complaint  Patient presents with   Diabetes    Rm12: new patient, here for routine [facility]   62 year old male presents the office today with above concerns.  States his nails are thickened elongated and are causing discomfort about the abdomen trimmed today.  No swelling redness or any drainage the toenail sites.  No open lesions.  Currently on Eliquis  Last A1c 7.1 on 09/18/2022 No ulcers he reports  Donell Beers, FNP Last seen 09/18/2022   Objective: AAO x3, NAD DP/PT pulses palpable bilaterally, CRT less than 3 seconds Nails are hypertrophic, dystrophic, brittle, discolored, elongated 10. No surrounding redness or drainage. Tenderness nails 1-5 bilaterally. No open lesions or pre-ulcerative lesions are identified today. No pain with calf compression, swelling, warmth, erythema  Assessment: Symptomatic onychomycosis  Plan: -Treatment options discussed including all alternatives, risks, and complications -Etiology of symptoms were discussed -Nails debrided 10 without complications or bleeding. -Daily foot inspection -Follow-up in 3 months or sooner if any problems arise. In the meantime, encouraged to call the office with any questions, concerns, change in symptoms.   Ovid Curd, DPM

## 2022-10-04 ENCOUNTER — Other Ambulatory Visit: Payer: Self-pay | Admitting: Internal Medicine

## 2022-10-04 DIAGNOSIS — E1100 Type 2 diabetes mellitus with hyperosmolarity without nonketotic hyperglycemic-hyperosmolar coma (NKHHC): Secondary | ICD-10-CM

## 2022-10-05 ENCOUNTER — Telehealth: Payer: Self-pay

## 2022-10-05 ENCOUNTER — Other Ambulatory Visit: Payer: Self-pay

## 2022-10-05 DIAGNOSIS — E1100 Type 2 diabetes mellitus with hyperosmolarity without nonketotic hyperglycemic-hyperosmolar coma (NKHHC): Secondary | ICD-10-CM

## 2022-10-05 DIAGNOSIS — M109 Gout, unspecified: Secondary | ICD-10-CM

## 2022-10-05 MED ORDER — EMPAGLIFLOZIN 25 MG PO TABS: 25.0000 mg | ORAL_TABLET | Freq: Every day | ORAL | 3 refills | Status: DC

## 2022-10-05 MED ORDER — QUETIAPINE FUMARATE 100 MG PO TABS: 100.0000 mg | ORAL_TABLET | Freq: Two times a day (BID) | ORAL | 1 refills | Status: DC

## 2022-10-05 MED ORDER — ALLOPURINOL 100 MG PO TABS
100.0000 mg | ORAL_TABLET | Freq: Every day | ORAL | 0 refills | Status: DC
Start: 2022-10-05 — End: 2023-02-11

## 2022-10-05 MED ORDER — ATORVASTATIN CALCIUM 10 MG PO TABS
10.0000 mg | ORAL_TABLET | Freq: Every day | ORAL | 0 refills | Status: DC
Start: 2022-10-05 — End: 2022-12-29

## 2022-10-05 NOTE — Telephone Encounter (Signed)
Please advise Kh

## 2022-10-05 NOTE — Telephone Encounter (Signed)
Sent  in the liptor and jardiance. The rest was sent to provider for approval. [facility]

## 2022-10-05 NOTE — Telephone Encounter (Signed)
Caller & Relationship to patient:  MRN #  [id number]   Call Back Number: [phone]  Date of Last Office Visit: 10/04/2022     Date of Next Office Visit: 12/21/2022    Medication(s) to be Refilled: Atorvastatin, Quetiapine, Jardiance and Allopurinol  Pref[facility]: [facility]   ** Please notify patient to allow 48-72 hours to process** **Let patient know to contact pharmacy at the end of the day to make sure medication is ready. ** **If patient has not been seen in a year or longer, book an appointment **Advise to use MyChart for refill requests OR to contact their pharmacy

## 2022-10-14 ENCOUNTER — Ambulatory Visit: Payer: Medicaid Other | Admitting: Physician Assistant

## 2022-11-09 ENCOUNTER — Ambulatory Visit (INDEPENDENT_AMBULATORY_CARE_PROVIDER_SITE_OTHER): Payer: Medicaid Other | Admitting: Otolaryngology

## 2022-11-09 ENCOUNTER — Encounter (INDEPENDENT_AMBULATORY_CARE_PROVIDER_SITE_OTHER): Payer: Self-pay

## 2022-11-09 VITALS — Ht 68.0 in | Wt 331.0 lb

## 2022-11-09 DIAGNOSIS — G4733 Obstructive sleep apnea (adult) (pediatric): Secondary | ICD-10-CM

## 2022-11-09 DIAGNOSIS — Z930 Tracheostomy status: Secondary | ICD-10-CM

## 2022-11-09 DIAGNOSIS — Z6841 Body Mass Index (BMI) 40.0 and over, adult: Secondary | ICD-10-CM

## 2022-11-09 DIAGNOSIS — Z86718 Personal history of other venous thrombosis and embolism: Secondary | ICD-10-CM | POA: Diagnosis not present

## 2022-11-09 DIAGNOSIS — J42 Unspecified chronic bronchitis: Secondary | ICD-10-CM

## 2022-11-09 NOTE — Progress Notes (Signed)
Dear Dr. Geoffery Spruce, Here is my assessment for our mutual patient, Timothy Le. Thank you for allowing me the opportunity to care for your patient. Please do not hesitate to contact me should you have any other questions. Sincerely, Dr. Jovita Kussmaul  Otolaryngology Clinic Note Referring provider: Dr. Geoffery Spruce HPI:  Timothy Le is a 62 y.o. male kindly referred by Dr. Geoffery Spruce for evaluation of tracheostomy management. 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 Underwent Tracheostomy by Dr. Suszanne Conners (2019) for prolonged intubation (7 XLT cuffed). Was eventually decannulated, and then again intubated in 2021 emergently due to failed reintubation attempts and then decannulated again it appears and then required intubation again. Had a goiter on CT, and underwent trach again by Dr. Pollyann Kennedy and thyroidectomy (only on left, right left in place) and revision tracheostomy 2021 with a 6-0 Cuffed shiley (adjustable length). He has had issues with trach replacement - including in 2021 with Dr. Pollyann Kennedy - 5 XLT at that point, and then again by Dr. Annalee Genta in 2021 with standard 4-0 Shiley uncuffed. Then lost to follow up. He has been following with [facility] prior for his trach needs.  Breathing ok right now, he cleans the trach every morning. He reports he had an accidental decannulation 3-4 months ago and his next door neighbor replaced his trach.  Uses PMV. No bleeding around trach site. No pneumonias or discolored secretions.  Current trach: [id number] - looks like last documented change 05/2020; uses PMV Last seen by  pulm in 2022 (trach clinic) and at that point working towards CPAP regimen and was tolerating capping w/occlusive valve.  He still does not use a CPAP or anything of that nature. Has not seen pulm it appears since at least 2022.  H&N  Surgery: see above; Trach x3?(At least 2x), and thyroid lobectomy Personal or FHx of bleeding dz or anesthesia difficulty: no   Independent Review of Additional Tests or Records:  See above; op notes, pulm notes, prior PMHx summaried above CXR 8/19  PMH/Meds/All/SocHx/FamHx/ROS:   Past Medical History:  Diagnosis Date   Asthma    Chronic respiratory failure with hypoxia (HCC) 07/09/2017   COPD (chronic obstructive pulmonary disease) (HCC)    Diabetes mellitus without complication (HCC)    Difficult intubation    Hypertension    Severe obesity (BMI >= 40) (HCC)    Shortness of breath dyspnea    Sleep apnea    Tracheobronchitis      Past Surgical History:  Procedure Laterality Date   CORONARY ULTRASOUND/IVUS Left 12/11/2019   Procedure: Intravascular Ultrasound/IVUS;  Surgeon: Maeola Harman, MD;  Location: [facility] INVASIVE CV LAB;  Service: Cardiovascular;  Laterality: Left;  Left lower extremity venous   IR GASTROSTOMY TUBE MOD SED  07/05/2017   IVC VENOGRAPHY N/A 01/10/2021   Procedure: IVC VENOGRAPHY - VENA CAVA;  Surgeon: Chuck Hint, MD;  Location: [facility] INVASIVE CV LAB;  Service: Cardiovascular;  Laterality: N/A;   LOWER EXTREMITY VENOGRAPHY N/A 01/10/2021   Procedure: LOWER EXTREMITY VENOGRAPHY;  Surgeon: Chuck Hint, MD;  Location: [facility] INVASIVE CV LAB;  Service: Cardiovascular;  Laterality: N/A;   PERIPHERAL VASCULAR THROMBECTOMY Left 12/11/2019   Procedure: PERIPHERAL VASCULAR THROMBECTOMY;  Surgeon: Maeola Harman, MD;  Location: [facility] INVASIVE CV LAB;  Service: Cardiovascular;  Laterality: Left;   THYROIDECTOMY Left 08/30/2019   Procedure: THYROIDECTOMY CERVICAL LIPECTOMY;  Surgeon: Serena Colonel, MD;  Location: [facility] OR;  Service: ENT;  Laterality: Left;  NEEDS RNFA PLEASE   TRACHEOSTOMY TUBE PLACEMENT N/A 06/17/2017   Procedure: TRACHEOSTOMY;  Surgeon: Newman Pies, MD;  Location: [facility] OR;  Service: ENT;  Laterality: N/A;   TRACHEOSTOMY TUBE  PLACEMENT N/A 08/14/2019   Procedure: TRACHEOSTOMY;  Surgeon: Serena Colonel, MD;  Location: [facility] OR;  Service: ENT;  Laterality: N/A;   TRACHEOSTOMY TUBE PLACEMENT N/A 08/30/2019   Procedure: TRACHEOSTOMY  REVISION;  Surgeon: Serena Colonel, MD;  Location: [facility] OR;  Service: ENT;  Laterality: N/A;    Family History  Problem Relation Age of Onset   Asthma Sister    Asthma Other        nephew   Hypertension Sister    Diabetes type II Sister      Social Connections: Not on file     Current Outpatient Medications:    albuterol (VENTOLIN HFA) 108 (90 Base) MCG/ACT inhaler, Inhale 2 puffs into the lungs every 6 (six) hours as needed for wheezing or shortness of breath., Disp: 1 each, Rfl: 4   allopurinol (ZYLOPRIM) 100 MG tablet, Take 1 tablet (100 mg total) by mouth daily., Disp: 90 tablet, Rfl: 0   atorvastatin (LIPITOR) 10 MG tablet, Take 1 tablet (10 mg total) by mouth daily., Disp: 90 tablet, Rfl: 0   BD INSULIN SYRINGE U/F 31G X 5/16" 0.5 ML MISC, Inject 1 each into the skin 2 (two) times daily., Disp: 180 each, Rfl: 3   blood glucose meter kit and supplies, Dispense based on patient and insurance preference. Use up to four times daily as directed. (FOR ICD-10 E10.9, E11.9)., Disp: 1 each, Rfl: 0   diclofenac Sodium (VOLTAREN) 1 % GEL, Apply 2 g topically 4 (four) times daily., Disp: 150 g, Rfl: 5   ELIQUIS 5 MG TABS tablet, TAKE 1 TABLET BY MOUTH TWICE A DAY, Disp: 180 tablet, Rfl: 1   empagliflozin (JARDIANCE) 25 MG TABS tablet, Take 1 tablet (25 mg total) by mouth daily before breakfast., Disp: 90 tablet, Rfl: 3   gabapentin (NEURONTIN) 100 MG capsule, TAKE 2 CAPSULES BY MOUTH 3 TIMES DAILY., Disp: 540 capsule, Rfl: 0   glucose blood (ACCU-CHEK GUIDE) test strip, Use as instructed, Disp: 100 strip, Rfl: 3   insulin glargine (LANTUS SOLOSTAR) 100 UNIT/ML Solostar Pen, Inject 44 Units into the skin 2 (two) times daily., Disp: 75 mL, Rfl: 6   Insulin Pen Needle 29G X MISC, 1 Device by Does  not apply route in the morning and at bedtime., Disp: 200 each, Rfl: 3   metFORMIN (GLUCOPHAGE-XR) 500 MG 24 hr tablet, Take 1 tablet (500 mg total) by mouth in the morning and at bedtime., Disp: 180 tablet, Rfl: 3   metoprolol tartrate (LOPRESSOR) 25 MG tablet, TAKE 1/2 TABLET TWICE A DAY BY MOUTH, Disp: 90 tablet, Rfl: 1   mometasone-formoterol (DULERA) 100-5 MCG/ACT AERO, INHALE 2 PUFFS INTO THE LUNGS TWICE A DAY, Disp: 8.8 each, Rfl: 2   QUEtiapine (SEROQUEL) 100 MG tablet, Take 1 tablet (100 mg total) by mouth 2 (two) times daily., Disp: 180 tablet, Rfl: 1   senna-docusate (SENOKOT-S) 8.6-50 MG tablet, Take 2 tablets by mouth at bedtime as needed for moderate constipation., Disp: 60 tablet, Rfl: 0   sertraline (ZOLOFT) 100 MG tablet, TAKE 1 TABLET BY MOUTH EVERY DAY, Disp: 90 tablet, Rfl: 0   torsemide (DEMADEX) 10 MG tablet, TAKE 1 TABLET BY MOUTH EVERY DAY, Disp: 90 tablet, Rfl: 0  pantoprazole (PROTONIX) 40 MG tablet, Take 1 tablet (40 mg total) by mouth at bedtime., Disp: 90 tablet, Rfl: 1   Phenylephrine-DM-GG (MUCINEX FAST-MAX CONGEST COUGH) 2.5-5-100 MG/5ML LIQD, Take 20 mLs by mouth daily as needed (cough and chest congestion)., Disp: , Rfl:    potassium chloride SA (KLOR-CON M20) 20 MEQ tablet, Take 1 tablet (20 mEq total) by mouth once for 1 dose., Disp: 180 tablet, Rfl: 1   Physical Exam:   Ht 5\' 8"  (1.727 m)   Wt (!) 331 lb (150.1 kg)   BMI 50.33 kg/m    Salient findings:  CN II-XII intact  Bilateral EAC clear and TM intact with well pneumatized middle ear spaces Obese male, BMI 50 Anterior rhinoscopy: Septum relatively midline; bilateral inferior turbinates without significant hypertrophy No lesions of oral cavity/oropharynx No obviously palpable neck masses/lymphadenopathy/thyromegaly; prior neck incision well healed; 4 [id number] in place with PMV, fairly good voice. Able to tolerate brief finger plugging No respiratory distress or stridor  Procedures:  Procedure  Note Pre-procedure diagnosis:  Tracheostomy dependence, dysphonia, history of tracheal stenosis Post-procedure diagnosis: Same Procedure: Transnasal Fiberoptic Laryngoscopy and Tracheobronchoscopy CPT 31575 - Mod 25 Indication: see above Complications: None apparent EBL: 0 mL Date: 11/15/22   The procedure was undertaken to further evaluate the patient's complaint of tracheostomy dependence, dysphonia, and history of tracheal stenosis with desire for decannulation, with mirror exam inadequate for appropriate examination due to gag reflex and poor patient tolerance  Procedure:  Patient was identified as correct patient. Verbal consent was obtained. The nose was sprayed with oxymetazoline and 4% lidocaine. The The flexible laryngoscope was passed through the nose to view the nasal cavity, pharynx (oropharynx, hypopharynx) and larynx.  The larynx was examined at rest and during multiple phonatory tasks. Documentation was obtained and reviewed with patient. The scope was removed. The patient tolerated the procedure well. The scope was then passed through the tracheostomy to visualize the trachea and mainstem bronchi  Findings: The nasal cavity and nasopharynx did not reveal any masses or lesions, mucosa appeared to be without obvious lesions. The tongue base, pharyngeal walls, piriform sinuses, vallecula, epiglottis and postcricoid region are normal in appearance. There is some edema of bilateral vocal folds and possibly a component of posterior glottic stenosis such that I am unable to visualize the subglottis. The vocal folds do seem to be mobile bilaterally but are hypomobile. There are no lesions on the free edge of the vocal folds nor elsewhere in the larynx worrisome for malignancy.   Tracheostomy is seated in appropriate position without significant backwalling, clear view to carina and clear bilateral mainstem bronchi visualized  Electronically signed by: Read Drivers, MD 11/15/2022 12:30  PM   Impression & Plans:  Phoenyx Gallardo is a 62 y.o. male with multiple medical comorbidities including Diabetes, Severe OSA, BMI 50, COPD, HF, DVT (s/p thrombectomy) and balloon angioplasty on Eliquis presenting for:  OSA Tracheostomy management - We discussed that based on his exam and body habitus, he is currently not a candidate for decannulation. I am not sure given his BMI, he would be a candidate for decannulation due to his OSA. His tracheostomy is in appropriate position and he does not have a backup. I was going to change his trach today but could not as we did not have the size. 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 - Will refer him to [employer] as per prior - Will see him back in 1 month for trach change until he is established with pulm for routine change -  - f/u 1 month   I have personally spent 65 minutes involved in face-to-face and non-face-to-face activities for this patient on the day of the visit.  Professional time spent includes the following activities, in addition to those noted in the documentation: preparing to see the patient (review of outside documentation and results), performing a medically appropriate examination and/or evaluation, counseling and educating the patient/family/caregiver, ordering medications, performing procedures (TFL, tracheoscopy), referring and communicating with other healthcare professionals, documenting clinical information in the electronic or other health record, independently interpreting results and communicating results with the patient/family/caregiver    Thank you for allowing me the opportunity to care for your patient. Please do not hesitate to contact me should you have any other questions.  Sincerely, Jovita Kussmaul, MD Otolarynoglogist (ENT), [facility] ENT Specialist Phone: [phone] Fax: [phone]  11/09/2022, 9:52 AM \\4

## 2022-11-27 ENCOUNTER — Ambulatory Visit (HOSPITAL_COMMUNITY)
Admission: RE | Admit: 2022-11-27 | Discharge: 2022-11-27 | Disposition: A | Discharge status: Home / Self Care | Payer: Medicaid Other | Source: Ambulatory Visit | Attending: Acute Care | Admitting: Acute Care

## 2022-11-27 DIAGNOSIS — G4733 Obstructive sleep apnea (adult) (pediatric): Secondary | ICD-10-CM | POA: Diagnosis present

## 2022-11-27 DIAGNOSIS — Z930 Tracheostomy status: Secondary | ICD-10-CM | POA: Diagnosis not present

## 2022-11-27 DIAGNOSIS — E662 Morbid (severe) obesity with alveolar hypoventilation: Secondary | ICD-10-CM | POA: Diagnosis present

## 2022-11-27 DIAGNOSIS — J449 Chronic obstructive pulmonary disease, unspecified: Secondary | ICD-10-CM | POA: Diagnosis present

## 2022-11-27 NOTE — Progress Notes (Signed)
Reason for visit  Trach management   HPI 62 year old male well known to me from past. Got lost to follow up. Last time I saw Wilmon was in 2022. During that time I was working on evaluating him for decannulation and transition to CPAP but we were having trouble getting him a CPAP mask. Since I last saw him Maciah's health has been complicated by LE DVT requiring mechanical thrombectomy. This and his general medial treatment had consumed his therapy. He was referred to Dr Allena Katz who has kindly agreed to assist in his care but has asked [place] to continue with on-going general management. Chadric presents today in remarkable shape since I saw him in 2022. He has lost over 100 lbs! Is now able to walk! I barely recognized him. He was excited to start back at the clinic & our team was excited to have him back  Review of Systems  Constitutional:  Positive for weight loss. Negative for fever and malaise/fatigue.  HENT: Negative.    Eyes: Negative.   Respiratory:         Still has some level of DOE but able to pace himself. He walks w/ a walker. He actually walked from the patient entranec all the way to clinic w/out stopping although he was clearly exerting himself (prior was in wheel chair so this is fantastic)  Cardiovascular: Negative.   Gastrointestinal: Negative.   Genitourinary: Negative.   Musculoskeletal:  Positive for joint pain.  Skin: Negative.   Neurological: Negative.   Endo/Heme/Allergies:        Still having left lower ext discomfort from prior LLE DVT but has improves significantly    Exam  Pulse ox remaining > 90% room air  General obese 62 year old male who is now ambulatory and down > 100lbs. He truly looks amazing c/w when I last saw him  HENT NCAT no JVD His trach is size 4 cuffless. Speaks well w/ PMV, no air trapping. Phonation quality good stoma is unremarkable  Pulm clear Card rrr Abd soft  Ext warm LE edema  Neuro intact  Procedure  The current trach was removed. Site  inspected. It was clean and there was no drainage or exudate. A new # 4 cuffless trach was placed. I do note a slight lateral deviation of his stoma track but once noted the trach passed easily over obturator. Placement was verified via ETCO2. Pt tolerated well   Impression/plan Tracheostomy dependent  OHS OSA (severe) HFpEF DM type II H/o VTE requiring angioplasty and thrombectomy  Prior thyroidectomy   Tracheostomy dependence (HCC) Overview: Re-established in trach clinic 11/27/22  Also seeing Dr Allena Katz w/ [facility] Trach size # 4 cuffless Last change 11//8/24  Impression Trach dep 2/2 MO, severe OSA and OHS.  Jodeci has made amazing progress in his weight loss journey. He is interested in decannulation but is also not in a hurry for this and is committed to staying well. I do think he could be a candidate for decannulation but I also think we would need the following to addressed prior to this. 1) repeat PSG (has been > 2 yrs and w/ his wt loss would be helpful to know what his support needs would be. 2) he absolutely needs an extensive airway eval by ENT. Looks like Dr Allena Katz was able to evaluate to the level of the cords above the trach stoma but given how long he has had his trach his entire subglottic space past distal trach needs  to be evaluated to ensure there is no tracheal stenosis. Given that he tolerates PMV w/out air trapping and reports he tolerates red capping I am hopefull but really need to ensure we can safely proceed.   Plan For now I will continue to provide supportive care and assist w/ planned trach changes  Have recommended that he continues routine trach care and I am placing orders to his DME so that he can have supplies Will continue to work in collaboration w ENT. Would NOT consider decannulation w/out Dr Eliane Decree evaluation and support Will send a message to our office. Need to get him re-established w/ Dr Maple Hudson for his sleep medicine. He is going to need a  repeat PSG and then would need to demonstrate that he can be compliant w/ CPAP prior to consideration for CPAP.        I will plan on seeing Karin in about 10 weeks. He knows to contact me prior if needed.  I am excited to have Onalee Hua back in the clinic   My time 43 min  Simonne Martinet ACNP-BC [facility] Pulmonary/Critical Care Pager # [phone] OR # [phone] if no answer

## 2022-11-27 NOTE — Progress Notes (Signed)
Tracheostomy Procedure Note  Timothy Le [id number] May 02, 1960  Pre Procedure Tracheostomy Information  Trach Brand: Shiley Size:  4.0 [id number] Style: Uncuffed Secured by: Velcro   Procedure: Trach Cleaning and Trach Change    Post Procedure Tracheostomy Information  Trach Brand: Shiley Size:  4.0 [id number] Style: Uncuffed Secured by: Velcro   Post Procedure Evaluation:  ETCO2 positive color change from yellow to purple : yes Vital signs:VSS Patients current condition: stable Complications: No apparent complications Trach site exam: clean, dry Wound care done: 4 x 4 gauze drain Patient did tolerate procedure well.   Education: none  Prescription needs: none    Additional needs: New PMV given to patient at this visit Patient also given 1 box of inner cannulas and an extra #4,0  4IC65 inner cannulas  Patient will now be seen in 10 Weeks.

## 2022-12-02 ENCOUNTER — Ambulatory Visit: Payer: Medicaid Other | Attending: Internal Medicine | Admitting: Internal Medicine

## 2022-12-02 NOTE — Progress Notes (Deleted)
Cardiology Office Note:  .    Date:  12/02/2022  ID:  Timothy Le, DOB 07-31-1960, MRN [id number] PCP: Timothy Beers, FNP  [facility] Providers Cardiologist:  None { Click to update primary MD,subspecialty MD or APP then REFRESH:1}    CC: *** Consulted for the evaluation of *** at the behest of ***   History of Present Illness: .    Timothy Le is a 62 y.o. male ***    Relevant histories: .  Social *** ROS: As per HPI.   Studies Reviewed: .   Cardiac Studies & Procedures   CARDIAC CATHETERIZATION  CARDIAC CATHETERIZATION 12/11/2019  Narrative Images from the original result were not included.    Patient name: Timothy Le MRN: [id number] DOB: 09-14-1960 Sex: male  12/11/2019 Pre-operative Diagnosis: Extensive left lower extremity DVT Post-operative diagnosis:  Same Surgeon:  Timothy Junes C. Randie Heinz, MD Procedure Performed: 1.  Ultrasound-guided cannulation left popliteal vein 2.  Left lower extremity and central venography 3.  Intravascular ultrasound of IVC, left common external leg veins, left common femoral vein and femoral vein 4.  Mechanical thrombectomy of IVC, left common external leg veins, left common femoral and femoral veins with Inari Clottriever 5.  Stent of left common iliac vein with 16 x 90 mm Wallstent 6.  Balloon angioplasty of left common femoral and femoral vein with 8 mm balloon 7.  Moderate sedation with fentanyl Versed for 73 minutes   Indications: 62 year old male with recent history of extensive left lower extremity DVT.  He is indicated for left lower extremity venography with vascular ultrasound and possible intervention.  Findings: There was extensive DVT extending from his femoral vein all the way up into his IVC.  He did have a small channel in the common femoral vein as well as the external leg vein but the common iliac itself at the confluence of the IVC was occluded as was the femoral vein throughout.  After  mechanical thrombectomy we did establish a flow channel throughout the entire left lower extremity.  We did stent where previously was approximately 4 mm diameter we extended out to 12 mm diameter with a 60 mm stent.  We had a flow channel through the femoral and common femoral veins up to the external leg vein we did balloon this with an 8 mm balloon and a completion was much improved and there was even a flow channel by intravascular ultrasound as well.  Procedure:  The patient was identified in the holding area and taken to room 8.  The patient was then placed prone on the operating table.  He was sterilely prepped and draped.  A timeout was called.  Moderate sedation was administered with fentanyl and Versed and a nurse monitor his vital signs throughout the case.  Ultrasound was used to identify the popliteal vein.  The area anesthetized 1% lidocaine cannulated micropuncture needle followed the wire and sheath.  An image was saved the permanent record.  The vein was not compressible consistent with acute occlusion there was no.  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  Patient at this time was given 15,000 units of heparin later was given additional 5000.  We then placed an 8 French sheath.  We used Glidewire vantage and very catheter to cross into the IVC we performed central venogram.  We then placed the wire into the right subclavian vein and this was marked on the drapes.  We then placed the mechanical thrombectomy Inari sheath under fluoroscopic guidance.  We then performed mechanical thrombectomy for a total of 5 passes.  Completion intravascular ultrasound demonstrated possible residual thrombus that appeared chronic in the common femoral vein.  We then identified what appeared to be a compressed common iliac vein.  We primarily stented this with 16 x 90 stent and postdilated with 14 mm balloon.  Completion demonstrated patent stent with 12 mm lumen where previously was 4 mm.  We then performed mechanical  thrombectomy of the common femoral and external leg veins we removed chronic appearing thrombus there.  We then ballooned these with 8 mm balloon down to the femoral vein with a millimeter balloon.  Completion IVUS demonstrated flow channel.  We also demonstrated strong flow channel by venography at completion.  Satisfied with this we removed our wire.  We placed a suture bolster and removed our sheath.  He tolerated procedure without any complication.   Contrast: 40cc  Timothy C. Randie Heinz, MD [facility] Office: [phone] Pager: [phone]     ECHOCARDIOGRAM  ECHOCARDIOGRAM COMPLETE 07/31/2019  Narrative ECHOCARDIOGRAM REPORT    Patient Name:   Timothy Le Date of Exam: 07/31/2019 Medical Rec #:  [id number]        Height:       72.0 in Accession #:    [id number]       Weight:       320.1 lb Date of Birth:  [birth year]        BSA:          2.602 m Patient Age:    58 years         BP:           85/55 mmHg Patient Gender: M                HR:           74 bpm. Exam Location:  Inpatient  Procedure: 2D Echo, Color Doppler, Cardiac Doppler and Intracardiac Opacification Agent  Indications:    Acute Respiratory Insufficiency R06.89  History:        Patient has prior history of Echocardiogram examinations, most recent 06/10/2017. COPD; Risk Factors:Hypertension, Diabetes, Dyslipidemia and Sleep Apnea.  Sonographer:    Timothy Le Senior RDCS Referring Phys: [id number] Timothy Le   Sonographer Comments: Technically difficult study due to patient body habitus; scanned supine on artificial respirator. Majority of study performed from subcostal. IMPRESSIONS   1. Extremely limited; definity used; essentially no parasternal or apical views; LV function likely low normal; cannot R/O focal wall motion abnormalities. 2. Left ventricular ejection fraction, by estimation, is 50 to 55%. The left ventricle has low normal function. The left ventricle has no regional  wall motion abnormalities. There is Not Assessed left ventricular hypertrophy. Left ventricular diastolic function could not be evaluated. 3. Right ventricular systolic function is normal. The right ventricular size is normal. Tricuspid regurgitation signal is inadequate for assessing PA pressure. 4. The mitral valve is normal in structure. No evidence of mitral valve regurgitation. No evidence of mitral stenosis. 5. The aortic valve was not well visualized. Aortic valve regurgitation is not visualized. 6. The inferior vena cava is normal in size with <50% respiratory variability, suggesting right atrial pressure of 8 mmHg.  FINDINGS Left Ventricle: Left ventricular ejection fraction, by estimation, is 50 to 55%. The left ventricle has low normal function. The left ventricle has no regional wall motion abnormalities. Definity contrast agent was given IV to delineate the left ventricular endocardial borders. The left ventricular  internal cavity size was normal in size. There is Not Assessed left ventricular hypertrophy. Left ventricular diastolic function could not be evaluated.  Right Ventricle: The right ventricular size is normal. Right vetricular wall thickness was not assessed. Right ventricular systolic function is normal. Tricuspid regurgitation signal is inadequate for assessing PA pressure.  Left Atrium: Left atrial size was normal in size.  Right Atrium: Right atrial size was normal in size.  Pericardium: There is no evidence of pericardial effusion.  Mitral Valve: The mitral valve is normal in structure. Normal mobility of the mitral valve leaflets. No evidence of mitral valve regurgitation. No evidence of mitral valve stenosis.  Tricuspid Valve: The tricuspid valve is normal in structure. Tricuspid valve regurgitation is trivial. No evidence of tricuspid stenosis.  Aortic Valve: The aortic valve was not well visualized. Aortic valve regurgitation is not visualized.  Pulmonic  Valve: The pulmonic valve was not well visualized. Pulmonic valve regurgitation is not visualized.  Aorta: The aortic root is normal in size and structure.  Venous: The inferior vena cava is normal in size with less than 50% respiratory variability, suggesting right atrial pressure of 8 mmHg.   Additional Comments: Extremely limited; definity used; essentially no parasternal or apical views; LV function likely low normal; cannot R/O focal wall motion abnormalities.   LEFT VENTRICLE PLAX 2D LVIDd:         3.37 cm LVIDs:         2.45 cm LV PW:         0.99 cm LV IVS:        1.43 cm LVOT diam:     2.20 cm LVOT Area:     3.80 cm   RIGHT VENTRICLE RV S prime:     11.50 cm/s TAPSE (M-mode): 2.1 cm  LEFT ATRIUM           Index       RIGHT ATRIUM           Index LA diam:      4.50 cm 1.73 cm/m  RA Area:     24.40 cm LA Vol (A4C): 41.1 ml 15.80 ml/m RA Volume:   76.20 ml  29.29 ml/m  AORTA Ao Root diam: 3.10 cm Ao Asc diam:  3.30 cm  TRICUSPID VALVE TR Peak grad:   19.9 mmHg TR Vmax:        223.00 cm/s  SHUNTS Systemic Diam: 2.20 cm  Olga Millers MD Electronically signed by Olga Millers MD Signature Date/Time: 07/31/2019/3:04:30 PM    Final             *** Risk Assessment/Calculations:    {Does this patient have ATRIAL FIBRILLATION?:[phone]}  {This patient may be at risk for Amyloid. He has one or more dx on the problem list or PMH from the following list - Abnormal EKG, CHF, Aortic Stenosis, Proteinuria, LVH, Carpal Tunnel Syndrome, Biceps Tendon Rupture, Syncope. See list below or review PMH.  Diagnoses From Problem List           Noted     Chronic diastolic CHF (congestive heart failure) (HCC) 09/07/2019    Click HERE to open Cardiac Amyloid Screening SmartSet to order screening OR Click HERE to defer testing for 1 year or permanently :1}    Physical Exam:    VS:  There were no vitals taken for this visit.   Wt Readings from Last 3 Encounters:   11/09/22 (!) 331 lb (150.1 kg)  09/18/22 (!) 331 lb (150.1 kg)  09/07/22 (!) 340 lb (154.2 kg)    Gen: *** distress, *** obese/well nourished/malnourished   Neck: No JVD, *** carotid bruit Ears: *** Frank Sign Cardiac: No Rubs or Gallops, *** Murmur, ***cardia, *** radial pulses Respiratory: Clear to auscultation bilaterally, *** effort, ***  respiratory rate GI: Soft, nontender, non-distended *** MS: No *** edema; *** moves all extremities Integument: Skin feels *** Neuro:  At time of evaluation, alert and oriented to person/place/time/situation *** Psych: Normal affect, patient feels ***   ASSESSMENT AND PLAN: .    ***   Riley Lam, MD FASE [facility] Cardiologist [facility]  [address] [place], [address] [place], [place] [address] [phone]  1:14 PM

## 2022-12-03 ENCOUNTER — Encounter: Payer: Self-pay | Admitting: Internal Medicine

## 2022-12-07 ENCOUNTER — Ambulatory Visit (INDEPENDENT_AMBULATORY_CARE_PROVIDER_SITE_OTHER): Payer: Medicaid Other

## 2022-12-11 ENCOUNTER — Other Ambulatory Visit: Payer: Self-pay | Admitting: Nurse Practitioner

## 2022-12-11 DIAGNOSIS — F418 Other specified anxiety disorders: Secondary | ICD-10-CM

## 2022-12-21 ENCOUNTER — Ambulatory Visit: Payer: Self-pay | Admitting: Nurse Practitioner

## 2022-12-28 ENCOUNTER — Ambulatory Visit (INDEPENDENT_AMBULATORY_CARE_PROVIDER_SITE_OTHER): Payer: Medicaid Other | Admitting: Nurse Practitioner

## 2022-12-28 ENCOUNTER — Encounter: Payer: Self-pay | Admitting: Nurse Practitioner

## 2022-12-28 VITALS — BP 125/62 | HR 98 | Temp 96.8°F | Wt 342.6 lb

## 2022-12-28 DIAGNOSIS — G8929 Other chronic pain: Secondary | ICD-10-CM | POA: Diagnosis not present

## 2022-12-28 DIAGNOSIS — M25562 Pain in left knee: Secondary | ICD-10-CM | POA: Diagnosis not present

## 2022-12-28 DIAGNOSIS — J449 Chronic obstructive pulmonary disease, unspecified: Secondary | ICD-10-CM | POA: Diagnosis not present

## 2022-12-28 DIAGNOSIS — Z794 Long term (current) use of insulin: Secondary | ICD-10-CM | POA: Diagnosis not present

## 2022-12-28 DIAGNOSIS — I824Y2 Acute embolism and thrombosis of unspecified deep veins of left proximal lower extremity: Secondary | ICD-10-CM | POA: Diagnosis not present

## 2022-12-28 DIAGNOSIS — E1142 Type 2 diabetes mellitus with diabetic polyneuropathy: Secondary | ICD-10-CM

## 2022-12-28 DIAGNOSIS — Z23 Encounter for immunization: Secondary | ICD-10-CM | POA: Diagnosis not present

## 2022-12-28 DIAGNOSIS — I5032 Chronic diastolic (congestive) heart failure: Secondary | ICD-10-CM | POA: Diagnosis not present

## 2022-12-28 DIAGNOSIS — E785 Hyperlipidemia, unspecified: Secondary | ICD-10-CM

## 2022-12-28 MED ORDER — TORSEMIDE 10 MG PO TABS
10.0000 mg | ORAL_TABLET | Freq: Every day | ORAL | 1 refills | Status: DC
Start: 2022-12-28 — End: 2023-02-11

## 2022-12-28 MED ORDER — BLOOD GLUCOSE METER KIT: PACK | 0 refills | Status: DC

## 2022-12-28 MED ORDER — BLOOD GLUCOSE MONITORING SUPPL DEVI: 1.0000 | Freq: Three times a day (TID) | 0 refills | Status: DC

## 2022-12-28 MED ORDER — METOPROLOL TARTRATE 25 MG PO TABS
12.5000 mg | ORAL_TABLET | Freq: Two times a day (BID) | ORAL | 1 refills | Status: DC
Start: 2022-12-28 — End: 2023-02-11

## 2022-12-28 MED ORDER — LANCETS MISC. MISC: 1.0000 | Freq: Three times a day (TID) | 0 refills | Status: AC

## 2022-12-28 MED ORDER — LANCET DEVICE MISC: 1.0000 | Freq: Three times a day (TID) | 0 refills | Status: AC

## 2022-12-28 MED ORDER — KETOROLAC TROMETHAMINE 30 MG/ML IJ SOLN
30.0000 mg | Freq: Once | INTRAMUSCULAR | Status: AC
  Administered 2022-12-28: 30 mg via INTRAMUSCULAR

## 2022-12-28 MED ORDER — BLOOD GLUCOSE TEST VI STRP: 1.0000 | ORAL_STRIP | Freq: Three times a day (TID) | 0 refills | Status: AC

## 2022-12-28 NOTE — Assessment & Plan Note (Addendum)
Lab Results  Component Value Date   HGBA1C 7.1 (A) 09/18/2022  Chronic medical condition currently well-controlled A1c 6.7 today On metformin 500 mg twice daily, Lantus 44 units twice daily, Jardiance 25 mg daily Patient denies personal or family history of MTC or MEN 2.  They denied personal history of pancreatitis.  Patient is a candidate for a GLP-1 will refer patient to the clinical pharmacist to assist with initiating an appropriate GLP-1. Continue current medications in the meantime Encouraged to get diabetes eye exam completed Up to date with diabetic foot exam .

## 2022-12-28 NOTE — Addendum Note (Signed)
Addended by: Renelda Loma on: 12/28/2022 04:53 PM   Modules accepted: Orders

## 2022-12-28 NOTE — Assessment & Plan Note (Signed)
Continue albuterol 2 puffs every 6 hours as needed Dulera 100-5 mcg, 2 puffs twice daily Follow-up to the pulmonologist office provided patient encouraged to follow-up with them. Tracheostomy tube is is capped, patient currently on room air

## 2022-12-28 NOTE — Patient Instructions (Addendum)
Please call the lung specialist office with this [facility] [phone] .   number  Please call the orthopedic specialist for your chronic left knee pain [facility] [phone]   You missed your last appointment with the cardiology please call the office and reschedule your appointment.   [address], [place], [place] [address] Phone: [phone]   Our clinical pharmacist will be reaching out to you regarding starting you on Ozempic.  Please answer the phone number when they call you  Goal for fasting blood sugar ranges from 80 to 120 and 2 hours after any meal or at bedtime should be between 130 to 170.    It is important that you exercise regularly at least 30 minutes 5 times a week as tolerated  Think about what you will eat, plan ahead. Choose " clean, green, fresh or frozen" over canned, processed or packaged foods which are more sugary, salty and fatty. 70 to 75% of food eaten should be vegetables and fruit. Three meals at set times with snacks allowed between meals, but they must be fruit or vegetables. Aim to eat over a 12 hour period , example 7 am to 7 pm, and STOP after  your last meal of the day. Drink water,generally about 64 ounces per day, no other drink is as healthy. Fruit juice is best enjoyed in a healthy way, by EATING the fruit.  Thanks for choosing [facility] we consider it a privelige to serve you.

## 2022-12-28 NOTE — Assessment & Plan Note (Signed)
Continues Eliquis 5 mg twice daily

## 2022-12-28 NOTE — Assessment & Plan Note (Addendum)
Continue Jardiance 25 mg daily, metoprolol 12.5 mg twice daily, torsemide 10 mg daily. Encouraged to follow-up with cardiology

## 2022-12-28 NOTE — Assessment & Plan Note (Signed)
Patient educated on CDC recommendation for the vaccine. Verbal consent was obtained from the patient, vaccine administered by nurse, no sign of adverse reactions noted at this time. Patient education on arm soreness and use of tylenol  for this patient  was discussed. Patient educated on the signs and symptoms of adverse effect and advise to contact the office if they occur. Vaccine information sheet given to patient.  

## 2022-12-28 NOTE — Assessment & Plan Note (Signed)
Toradol 30 mg IM injection given in the office today Continue gabapentin 200 mg 3 times daily Encouraged to take OTC Tylenol as needed For low back to the orthopedics office provided patient encouraged to schedule an appointment for possible steroid joint injection

## 2022-12-28 NOTE — Progress Notes (Signed)
Established Patient Office Visit  Subjective:  Patient ID: Timothy Le, male    DOB: 1960/08/26  Age: 62 y.o. MRN: [id number]  CC:  Chief Complaint  Patient presents with   Follow-up    3 month follow up    HPI Timothy Le is a 62 y.o. male  has a past medical history of Asthma, Chronic respiratory failure with hypoxia (HCC) (07/09/2017), COPD (chronic obstructive pulmonary disease) (HCC), Diabetes mellitus without complication (HCC), Difficult intubation, Hypertension, Severe obesity (BMI >= 40) (HCC), Shortness of breath dyspnea, Sleep apnea, and Tracheobronchitis.   Patient presents for follow-up for his chronic medical conditions.. Patient denies any adverse reactions to current medications  Type 2 diabetes.  Currently on metformin 500 mg twice daily, Jardiance 25 mg daily, Lantus 44 units twice daily.  Patient denies hypoglycemia polyphagia polyuria polydipsia.  CHF.  Currently on torsemide 10 mg daily, metoprolol 12.5 mg twice daily.  Patient was referred to cardiology at his last visit but he missed the appointment.  Has chronic shortness of breath, denies chest pain, edema.  Chronic left knee pain.  Takes gabapentin 200 mg twice daily.  Currently has aching pain rated 5/10, he was referred to orthopedics but he did not follow-up.  Stated that he had a steroid joints injection that helped him in the past,    Cologuard was ordered to screen for colon cancer but the patient stated that he is not interested in colon cancer screening,   Flu vaccine given in the office today patient encouraged to get Tdap vaccine and shingles vaccine at the pharmacy    Past Medical History:  Diagnosis Date   Asthma    Chronic respiratory failure with hypoxia (HCC) 07/09/2017   COPD (chronic obstructive pulmonary disease) (HCC)    Diabetes mellitus without complication (HCC)    Difficult intubation    Hypertension    Severe obesity (BMI >= 40) (HCC)    Shortness of breath dyspnea     Sleep apnea    Tracheobronchitis     Past Surgical History:  Procedure Laterality Date   CORONARY ULTRASOUND/IVUS Left 12/11/2019   Procedure: Intravascular Ultrasound/IVUS;  Surgeon: Maeola Harman, MD;  Location: [facility] INVASIVE CV LAB;  Service: Cardiovascular;  Laterality: Left;  Left lower extremity venous   IR GASTROSTOMY TUBE MOD SED  07/05/2017   IVC VENOGRAPHY N/A 01/10/2021   Procedure: IVC VENOGRAPHY - VENA CAVA;  Surgeon: Chuck Hint, MD;  Location: [facility] INVASIVE CV LAB;  Service: Cardiovascular;  Laterality: N/A;   LOWER EXTREMITY VENOGRAPHY N/A 01/10/2021   Procedure: LOWER EXTREMITY VENOGRAPHY;  Surgeon: Chuck Hint, MD;  Location: [facility] INVASIVE CV LAB;  Service: Cardiovascular;  Laterality: N/A;   PERIPHERAL VASCULAR THROMBECTOMY Left 12/11/2019   Procedure: PERIPHERAL VASCULAR THROMBECTOMY;  Surgeon: Maeola Harman, MD;  Location: [facility] INVASIVE CV LAB;  Service: Cardiovascular;  Laterality: Left;   THYROIDECTOMY Left 08/30/2019   Procedure: THYROIDECTOMY CERVICAL LIPECTOMY;  Surgeon: Serena Colonel, MD;  Location: [facility] OR;  Service: ENT;  Laterality: Left;  NEEDS RNFA PLEASE   TRACHEOSTOMY TUBE PLACEMENT N/A 06/17/2017   Procedure: TRACHEOSTOMY;  Surgeon: Newman Pies, MD;  Location: [facility] OR;  Service: ENT;  Laterality: N/A;   TRACHEOSTOMY TUBE PLACEMENT N/A 08/14/2019   Procedure: TRACHEOSTOMY;  Surgeon: Serena Colonel, MD;  Location: [facility] OR;  Service: ENT;  Laterality: N/A;   TRACHEOSTOMY TUBE PLACEMENT N/A 08/30/2019   Procedure: TRACHEOSTOMY  REVISION;  Surgeon: Serena Colonel, MD;  Location: [facility] OR;  Service: ENT;  Laterality: N/A;    Family History  Problem Relation Age of Onset   Asthma Sister    Asthma Other        nephew   Hypertension Sister    Diabetes type II Sister     Social History   Socioeconomic History   Marital status: Legally Separated    Spouse name: Not on file   Number of children: 1   Years of education: Not on file    Highest education level: Not on file  Occupational History   Occupation: unemployed    Comment: working on disablity  Tobacco Use   Smoking status: Former    Current packs/day: 0.00    Average packs/day: 0.5 packs/day for 29.6 years (14.8 ttl pk-yrs)    Types: Cigarettes    Start date: 07/28/1983    Quit date: 02/27/2013    Years since quitting: 9.8   Smokeless tobacco: Never  Vaping Use   Vaping status: Never Used  Substance and Sexual Activity   Alcohol use: No    Comment: quit ETOH about 5 months ago-beer and liquor   Drug use: No    Comment: quit 6 months-"pot"-smoked couple joints a week   Sexual activity: Not Currently  Other Topics Concern   Not on file  Social History Narrative   Lives home alone   Social Determinants of Health   Financial Resource Strain: Not on file  Food Insecurity: Food Insecurity Present (09/18/2022)   Hunger Vital Sign    Worried About Running Out of Food in the Last Year: Often true    Ran Out of Food in the Last Year: Often true  Transportation Needs: Not on file  Physical Activity: Not on file  Stress: Not on file  Social Connections: Not on file  Intimate Partner Violence: Not At Risk (09/10/2022)   Humiliation, Afraid, Rape, and Kick questionnaire    Fear of Current or Ex-Partner: No    Emotionally Abused: No    Physically Abused: No    Sexually Abused: No    Outpatient Medications Prior to Visit  Medication Sig Dispense Refill   albuterol (VENTOLIN HFA) 108 (90 Base) MCG/ACT inhaler Inhale 2 puffs into the lungs every 6 (six) hours as needed for wheezing or shortness of breath. 1 each 4   allopurinol (ZYLOPRIM) 100 MG tablet Take 1 tablet (100 mg total) by mouth daily. 90 tablet 0   atorvastatin (LIPITOR) 10 MG tablet Take 1 tablet (10 mg total) by mouth daily. 90 tablet 0   BD INSULIN SYRINGE U/F 31G X 5/16" 0.5 ML MISC Inject 1 each into the skin 2 (two) times daily. 180 each 3   diclofenac Sodium (VOLTAREN) 1 % GEL Apply 2 g  topically 4 (four) times daily. 150 g 5   ELIQUIS 5 MG TABS tablet TAKE 1 TABLET BY MOUTH TWICE A DAY 180 tablet 1   empagliflozin (JARDIANCE) 25 MG TABS tablet Take 1 tablet (25 mg total) by mouth daily before breakfast. 90 tablet 3   gabapentin (NEURONTIN) 100 MG capsule TAKE 2 CAPSULES BY MOUTH 3 TIMES DAILY. 540 capsule 0   glucose blood (ACCU-CHEK GUIDE) test strip Use as instructed 100 strip 3   insulin glargine (LANTUS SOLOSTAR) 100 UNIT/ML Solostar Pen Inject 44 Units into the skin 2 (two) times daily. 75 mL 6   Insulin Pen Needle 29G X MISC 1 Device by Does not apply route in the morning and at bedtime. 200 each 3  metFORMIN (GLUCOPHAGE-XR) 500 MG 24 hr tablet Take 1 tablet (500 mg total) by mouth in the morning and at bedtime. 180 tablet 3   mometasone-formoterol (DULERA) 100-5 MCG/ACT AERO INHALE 2 PUFFS INTO THE LUNGS TWICE A DAY 8.8 each 2   pantoprazole (PROTONIX) 40 MG tablet Take 1 tablet (40 mg total) by mouth at bedtime. 90 tablet 1   Phenylephrine-DM-GG (MUCINEX FAST-MAX CONGEST COUGH) 2.5-5-100 MG/5ML LIQD Take 20 mLs by mouth daily as needed (cough and chest congestion).     potassium chloride SA (KLOR-CON M20) 20 MEQ tablet Take 1 tablet (20 mEq total) by mouth once for 1 dose. 180 tablet 1   QUEtiapine (SEROQUEL) 100 MG tablet Take 1 tablet (100 mg total) by mouth 2 (two) times daily. 180 tablet 1   senna-docusate (SENOKOT-S) 8.6-50 MG tablet Take 2 tablets by mouth at bedtime as needed for moderate constipation. 60 tablet 0   sertraline (ZOLOFT) 100 MG tablet TAKE 1 TABLET BY MOUTH EVERY DAY 90 tablet 0   blood glucose meter kit and supplies Dispense based on patient and insurance preference. Use up to four times daily as directed. (FOR ICD-10 E10.9, E11.9). 1 each 0   metoprolol tartrate (LOPRESSOR) 25 MG tablet TAKE 1/2 TABLET TWICE A DAY BY MOUTH 90 tablet 1   torsemide (DEMADEX) 10 MG tablet TAKE 1 TABLET BY MOUTH EVERY DAY 90 tablet 0   No facility-administered  medications prior to visit.    No Known Allergies  ROS Review of Systems  Constitutional:  Negative for appetite change, chills, fatigue and fever.  HENT:  Negative for congestion, postnasal drip, rhinorrhea and sneezing.   Respiratory:  Positive for shortness of breath. Negative for cough and wheezing.        Chronic stable, has a trach tube in place  Cardiovascular:  Negative for chest pain, palpitations and leg swelling.  Gastrointestinal:  Negative for abdominal pain, constipation, nausea and vomiting.  Genitourinary:  Negative for difficulty urinating, dysuria, flank pain and frequency.  Musculoskeletal:  Positive for arthralgias. Negative for back pain, joint swelling and myalgias.  Skin:  Negative for color change, pallor, rash and wound.  Neurological:  Negative for dizziness, facial asymmetry, weakness, numbness and headaches.  Psychiatric/Behavioral:  Negative for behavioral problems, confusion, self-injury and suicidal ideas.       Objective:    Physical Exam Vitals and nursing note reviewed.  Constitutional:      General: He is not in acute distress.    Appearance: Normal appearance. He is obese. He is not ill-appearing, toxic-appearing or diaphoretic.  HENT:     Mouth/Throat:     Mouth: Mucous membranes are moist.     Pharynx: Oropharynx is clear. No oropharyngeal exudate or posterior oropharyngeal erythema.  Eyes:     General: No scleral icterus.       Right eye: No discharge.        Left eye: No discharge.     Extraocular Movements: Extraocular movements intact.     Conjunctiva/sclera: Conjunctivae normal.  Cardiovascular:     Rate and Rhythm: Normal rate and regular rhythm.     Pulses: Normal pulses.     Heart sounds: Normal heart sounds. No murmur heard.    No friction rub. No gallop.  Pulmonary:     Effort: Pulmonary effort is normal. No respiratory distress.     Breath sounds: Normal breath sounds. No stridor. No wheezing, rhonchi or rales.      Comments: Tracheostomy tube  capped, dressing  clean and dry Chest:     Chest wall: No tenderness.  Abdominal:     General: There is no distension.     Palpations: Abdomen is soft.     Tenderness: There is no abdominal tenderness. There is no right CVA tenderness, left CVA tenderness or guarding.  Musculoskeletal:        General: Tenderness present. No swelling, deformity or signs of injury.     Right lower leg: No edema.     Left lower leg: No edema.     Comments: Tenderness on range of motion of left knee, uses a cane for ambulation, skin warm and dry no redness or swelling noted  Skin:    General: Skin is warm and dry.     Capillary Refill: Capillary refill takes less than 2 seconds.     Coloration: Skin is not jaundiced or pale.     Findings: No bruising, erythema or lesion.  Neurological:     Mental Status: He is alert and oriented to person, place, and time.     Motor: No weakness.     Coordination: Coordination normal.     Gait: Gait normal.  Psychiatric:        Mood and Affect: Mood normal.        Behavior: Behavior normal.        Thought Content: Thought content normal.        Judgment: Judgment normal.     BP 125/62   Pulse 98   Temp (!) 96.8 F (36 C)   Wt (!) 342 lb 9.6 oz (155.4 kg)   SpO2 100%   BMI 52.09 kg/m  Wt Readings from Last 3 Encounters:  12/28/22 (!) 342 lb 9.6 oz (155.4 kg)  11/09/22 (!) 331 lb (150.1 kg)  09/18/22 (!) 331 lb (150.1 kg)    Lab Results  Component Value Date   TSH 1.68 09/09/2021   Lab Results  Component Value Date   WBC 5.4 09/07/2022   HGB 13.8 09/07/2022   HCT 45.1 09/07/2022   MCV 89.3 09/07/2022   PLT 254 09/07/2022   Lab Results  Component Value Date   NA 138 09/07/2022   K 4.0 09/07/2022   CO2 24 09/07/2022   GLUCOSE 198 (H) 09/07/2022   BUN 20 09/07/2022   CREATININE 1.32 (H) 09/07/2022   BILITOT 0.8 09/07/2022   ALKPHOS 90 09/07/2022   AST 32 09/07/2022   ALT 28 09/07/2022   PROT 7.8 09/07/2022    ALBUMIN 3.6 09/07/2022   CALCIUM 8.8 (L) 09/07/2022   ANIONGAP 12 09/07/2022   EGFR 68 04/03/2022   GFR 58.31 (L) 09/09/2021   Lab Results  Component Value Date   CHOL 132 09/18/2022   Lab Results  Component Value Date   HDL 48 09/18/2022   Lab Results  Component Value Date   LDLCALC 70 09/18/2022   Lab Results  Component Value Date   TRIG 66 09/18/2022   Lab Results  Component Value Date   CHOLHDL 2.8 09/18/2022   Lab Results  Component Value Date   HGBA1C 7.1 (A) 09/18/2022      Assessment & Plan:   Problem List Items Addressed This Visit       Cardiovascular and Mediastinum   Chronic diastolic CHF (congestive heart failure) (HCC)    Continue Jardiance 25 mg daily, metoprolol 12.5 mg twice daily, torsemide 10 mg daily. Encouraged to follow-up with cardiology      Relevant Medications   torsemide (DEMADEX) 10  MG tablet   metoprolol tartrate (LOPRESSOR) 25 MG tablet   Acute deep vein thrombosis (DVT) of left lower extremity (HCC)    Continues Eliquis 5 mg twice daily      Relevant Medications   torsemide (DEMADEX) 10 MG tablet   metoprolol tartrate (LOPRESSOR) 25 MG tablet     Respiratory   COPD (chronic obstructive pulmonary disease) (HCC)    Continue albuterol 2 puffs every 6 hours as needed Dulera 100-5 mcg, 2 puffs twice daily Follow-up to the pulmonologist office provided patient encouraged to follow-up with them. Tracheostomy tube is is capped, patient currently on room air        Endocrine   Type 2 diabetes mellitus with diabetic polyneuropathy, with long-term current use of insulin (HCC) - Primary    Lab Results  Component Value Date   HGBA1C 7.1 (A) 09/18/2022  Chronic medical condition currently well-controlled A1c 6.7 today On metformin 500 mg twice daily, Lantus 44 units twice daily, Jardiance 25 mg daily Patient denies personal or family history of MTC or MEN 2.  They denied personal history of pancreatitis.  Patient is a candidate  for a GLP-1 will refer patient to the clinical pharmacist to assist with initiating an appropriate GLP-1. Continue current medications in the meantime Encouraged to get diabetes eye exam completed Up to date with diabetic foot exam .       Relevant Medications   Blood Glucose Monitoring Suppl DEVI   Glucose Blood (BLOOD GLUCOSE TEST STRIPS) STRP   Lancet Device MISC   Lancets Misc. MISC   Other Relevant Orders   Direct LDL   Basic metabolic panel   AMB Referral [facility] Care Management   Ambulatory referral to Ophthalmology   AMB Referral VBCI Care Management     Other   Dyslipidemia, goal LDL below 70    Continue atorvastatin 10 mg daily Avoid fatty fried foods LDL goal is less than 70 Checking lipid panel today Lab Results  Component Value Date   CHOL 132 09/18/2022   HDL 48 09/18/2022   LDLCALC 70 09/18/2022   TRIG 66 09/18/2022   CHOLHDL 2.8 09/18/2022         Relevant Medications   torsemide (DEMADEX) 10 MG tablet   metoprolol tartrate (LOPRESSOR) 25 MG tablet   Need for influenza vaccination    Patient educated on CDC recommendation for the vaccine. Verbal consent was obtained from the patient, vaccine administered by nurse, no sign of adverse reactions noted at this time. Patient education on arm soreness and use of tylenol for this patient  was discussed. Patient educated on the signs and symptoms of adverse effect and advise to contact the office if they occur. Vaccine information sheet given to patient.        Chronic pain of left knee    Toradol 30 mg IM injection given in the office today Continue gabapentin 200 mg 3 times daily Encouraged to take OTC Tylenol as needed For low back to the orthopedics office provided patient encouraged to schedule an appointment for possible steroid joint injection      Other Visit Diagnoses     Chronic diastolic (congestive) heart failure (HCC)       Relevant Medications   torsemide (DEMADEX) 10 MG tablet   metoprolol  tartrate (LOPRESSOR) 25 MG tablet       Meds ordered this encounter  Medications   torsemide (DEMADEX) 10 MG tablet    Sig: Take 1 tablet (10 mg total) by mouth  daily.    Dispense:  90 tablet    Refill:  1   metoprolol tartrate (LOPRESSOR) 25 MG tablet    Sig: Take 0.5 tablets (12.5 mg total) by mouth 2 (two) times daily.    Dispense:  90 tablet    Refill:  1    PATIENT NEEDS REFILL   DISCONTD: blood glucose meter kit and supplies    Sig: Dispense based on patient and insurance preference. Use up to two times daily as directed. (FOR ICD-10 E10.9, E11.9).    Dispense:  1 each    Refill:  0    Order Specific Question:   Number of strips    Answer:   100    Order Specific Question:   Number of lancets    Answer:   100   DISCONTD: blood glucose meter kit and supplies    Sig: Dispense based on patient and insurance preference. Use up to two times daily as directed. (FOR ICD-10 E10.9, E11.9).    Dispense:  1 each    Refill:  0    Order Specific Question:   Number of strips    Answer:   100    Order Specific Question:   Number of lancets    Answer:   100   DISCONTD: blood glucose meter kit and supplies    Sig: Dispense based on patient and insurance preference. Use up to two times daily as directed. (FOR ICD-10 E10.9, E11.9).    Dispense:  1 each    Refill:  0    Order Specific Question:   Number of strips    Answer:   100    Order Specific Question:   Number of lancets    Answer:   100   Blood Glucose Monitoring Suppl DEVI    Sig: 1 each by Does not apply route in the morning, at noon, and at bedtime. May substitute to any manufacturer covered by patient's insurance.    Dispense:  1 each    Refill:  0   Glucose Blood (BLOOD GLUCOSE TEST STRIPS) STRP    Sig: 1 each by In Vitro route in the morning, at noon, and at bedtime. May substitute to any manufacturer covered by patient's insurance.    Dispense:  100 strip    Refill:  0   Lancet Device MISC    Sig: 1 each by Does not  apply route in the morning, at noon, and at bedtime. May substitute to any manufacturer covered by patient's insurance.    Dispense:  1 each    Refill:  0   Lancets Misc. MISC    Sig: 1 each by Does not apply route in the morning, at noon, and at bedtime. May substitute to any manufacturer covered by patient's insurance.    Dispense:  100 each    Refill:  0    Follow-up: Return in about 4 months (around 04/28/2023) for CPE.    Donell Beers, FNP

## 2022-12-28 NOTE — Assessment & Plan Note (Signed)
Continue atorvastatin 10 mg daily Avoid fatty fried foods LDL goal is less than 70 Checking lipid panel today Lab Results  Component Value Date   CHOL 132 09/18/2022   HDL 48 09/18/2022   LDLCALC 70 09/18/2022   TRIG 66 09/18/2022   CHOLHDL 2.8 09/18/2022

## 2022-12-29 ENCOUNTER — Telehealth: Payer: Self-pay

## 2022-12-29 ENCOUNTER — Other Ambulatory Visit (HOSPITAL_COMMUNITY): Payer: Self-pay | Admitting: Nurse Practitioner

## 2022-12-29 DIAGNOSIS — E785 Hyperlipidemia, unspecified: Secondary | ICD-10-CM

## 2022-12-29 DIAGNOSIS — E1100 Type 2 diabetes mellitus with hyperosmolarity without nonketotic hyperglycemic-hyperosmolar coma (NKHHC): Secondary | ICD-10-CM

## 2022-12-29 MED ORDER — ATORVASTATIN CALCIUM 20 MG PO TABS: 20.0000 mg | ORAL_TABLET | Freq: Every day | ORAL | 1 refills | Status: DC

## 2022-12-29 NOTE — Progress Notes (Signed)
   Care Guide Note  12/29/2022 Name: Timothy Le MRN: [id number] DOB: 28-Mar-1960  Referred by: Donell Beers, FNP Reason for referral : Care Coordination (Outreach to schedule with Pharm d )   Timothy Le is a 62 y.o. year old male who is a primary care patient of Donell Beers, FNP. Timothy Le was referred to the pharmacist for assistance related to DM.    Successful contact was made with the patient to discuss pharmacy services including being ready for the pharmacist to call at least 5 minutes before the scheduled appointment time, to have medication bottles and any blood sugar or blood pressure readings ready for review. The patient agreed to meet with the pharmacist via with the pharmacist via telephone visit on (date/time).  02/04/2023  Penne Lash , RMA     [place]  [facility], [facility] Guide  Direct Dial: [phone]  Website: [email or web address]

## 2022-12-30 LAB — BASIC METABOLIC PANEL
BUN/Creatinine Ratio: 18 (ref 10–24)
BUN: 23 mg/dL (ref 8–27)
CO2: 22 mmol/L (ref 20–29)
Calcium: 9.3 mg/dL (ref 8.6–10.2)
Chloride: 103 mmol/L (ref 96–106)
Creatinine, Ser: 1.31 mg/dL — ABNORMAL HIGH (ref 0.76–1.27)
Glucose: 150 mg/dL — ABNORMAL HIGH (ref 70–99)
Potassium: 4.3 mmol/L (ref 3.5–5.2)
Sodium: 142 mmol/L (ref 134–144)
eGFR: 62 mL/min/{1.73_m2} (ref >59)

## 2022-12-30 LAB — LDL CHOLESTEROL, DIRECT: LDL Direct: 105 mg/dL — ABNORMAL HIGH (ref 0–99)

## 2022-12-31 ENCOUNTER — Ambulatory Visit: Payer: Medicaid Other | Admitting: Podiatry

## 2023-02-02 ENCOUNTER — Other Ambulatory Visit: Payer: Self-pay | Admitting: Pharmacist

## 2023-02-04 ENCOUNTER — Other Ambulatory Visit: Payer: Self-pay

## 2023-02-09 ENCOUNTER — Telehealth: Payer: Self-pay | Admitting: Pharmacist

## 2023-02-09 ENCOUNTER — Other Ambulatory Visit (HOSPITAL_COMMUNITY): Payer: Self-pay

## 2023-02-09 ENCOUNTER — Other Ambulatory Visit: Payer: Self-pay | Admitting: Pharmacist

## 2023-02-09 DIAGNOSIS — F418 Other specified anxiety disorders: Secondary | ICD-10-CM

## 2023-02-09 DIAGNOSIS — M109 Gout, unspecified: Secondary | ICD-10-CM

## 2023-02-09 DIAGNOSIS — J449 Chronic obstructive pulmonary disease, unspecified: Secondary | ICD-10-CM

## 2023-02-09 DIAGNOSIS — J439 Emphysema, unspecified: Secondary | ICD-10-CM

## 2023-02-09 DIAGNOSIS — E1100 Type 2 diabetes mellitus with hyperosmolarity without nonketotic hyperglycemic-hyperosmolar coma (NKHHC): Secondary | ICD-10-CM

## 2023-02-09 DIAGNOSIS — I5032 Chronic diastolic (congestive) heart failure: Secondary | ICD-10-CM

## 2023-02-09 MED ORDER — OZEMPIC (0.25 OR 0.5 MG/DOSE) 2 MG/3ML ~~LOC~~ SOPN
PEN_INJECTOR | SUBCUTANEOUS | 2 refills | Status: DC
  Filled 2023-02-09: qty 3, 28d supply, fill #0
  Filled 2023-02-09: qty 3, fill #0

## 2023-02-09 MED ORDER — LANCETS MISC. MISC
3 refills | Status: DC
Start: 2023-02-09 — End: 2023-02-11
  Filled 2023-02-09: qty 100, fill #0

## 2023-02-09 MED ORDER — PEN NEEDLES 32G X 4 MM MISC
3 refills | Status: DC
  Filled 2023-02-09: qty 100, fill #0

## 2023-02-09 MED ORDER — LANTUS SOLOSTAR 100 UNIT/ML ~~LOC~~ SOPN
32.0000 [IU] | PEN_INJECTOR | Freq: Two times a day (BID) | SUBCUTANEOUS | 3 refills | Status: DC
  Filled 2023-02-09: qty 15, 23d supply, fill #0

## 2023-02-09 MED ORDER — ATORVASTATIN CALCIUM 40 MG PO TABS
40.0000 mg | ORAL_TABLET | Freq: Every day | ORAL | 3 refills | Status: DC
  Filled 2023-02-09: qty 90, 90d supply, fill #0

## 2023-02-09 MED ORDER — LANCET DEVICE MISC
0 refills | Status: DC
Start: 2023-02-09 — End: 2023-06-23
  Filled 2023-02-09: qty 1, fill #0

## 2023-02-09 MED ORDER — BLOOD GLUCOSE TEST VI STRP
ORAL_STRIP | 3 refills | Status: DC
  Filled 2023-02-09: qty 100, 30d supply, fill #0
  Filled 2023-02-09 – 2023-03-12 (×2): qty 100, 33d supply, fill #0
  Filled 2023-03-30 (×2): qty 100, 30d supply, fill #0

## 2023-02-09 MED ORDER — BLOOD GLUCOSE MONITOR SYSTEM W/DEVICE KIT
PACK | 0 refills | Status: DC
  Filled 2023-02-09: qty 1, fill #0
  Filled 2023-03-30: qty 1, 28d supply, fill #0

## 2023-02-09 NOTE — Progress Notes (Signed)
Patient would like to transition pharmacies to [facility]. Can we please send refills on everything prescribed by PCP to [facility] at [facility]?  Orders are pended in this encounter

## 2023-02-09 NOTE — Progress Notes (Signed)
02/09/2023 Name: Timothy Le MRN: [id number] DOB: 24-Jan-1960  Chief Complaint  Patient presents with   Diabetes   Medication Management    Timothy Le is a 62 y.o. year old male who presented for a telephone visit.   They were referred to the pharmacist by their PCP for assistance in managing diabetes, hypertension, and hyperlipidemia.    Subjective:  Care Team: Primary Care Provider: Donell Beers, FNP ; Next Scheduled Visit: 05/05/23  Medication Access/Adherence  Current Pharmacy:  [id number] - [place],  - [address] AT [facility] [address] [place] [address] Phone: [phone] Fax: [phone]   Patient reports affordability concerns with their medications: No  Patient reports access/transportation concerns to their pharmacy: No  Patient reports adherence concerns with their medications:  No     Diabetes:  Current medications: metformin XR 500 mg twice daily, Lantus 44 units twice daily, Jardiance 25 mg daily   Current glucose readings: he does not have a working glucometer   Patient denies hypoglycemic s/sx including dizziness, shakiness, sweating. Patient denies hyperglycemic symptoms including polyuria, polydipsia, polyphagia, nocturia, neuropathy, blurred vision.  Current meal patterns:  - Breakfast: frozen burrito w/ egg,  - Lunch: frozen fish sticks, small serving of fries - Supper: frozen meal;  - Snacks:  - Drinks: water; has cut back on sodas; occasional breakfast juice   Notes that he likes frozen vegetables  Current physical activity: walks outside when not cold outside. Walks with neighbor to help feel safe (he uses a cane)  Current medication access support: reports that pen needles are expensive   Heart Failure (EF 50-55%):  Current medications:  ACEi/ARB/ARNI: none, previously on lisinopril but soft blood pressures at one point SGLT2i: Jardiance 25 mg daily   Beta blocker: metoprolol tartrate 12.5 mg twice daily Mineralocorticoid Receptor Antagonist: none Diuretic regimen: torsemide 10 mg daily + potassium 20 mEq   Hyperlipidemia/ASCVD Risk Reduction  Current lipid lowering medications: atorvastatin 20 mg daily   Antiplatelet regimen: none due to Eliquis 5 mg twice daily - history of DVT   Clinical ASCVD: No  The 10-year ASCVD risk score (Arnett DK, et al., 2019) is: 21.6%   Values used to calculate the score:     Age: 58 years     Sex: Male     Is Non-Hispanic African American: Yes     Diabetic: Yes     Tobacco smoker: No     Systolic Blood Pressure: 125 mmHg     Is BP treated: Yes     HDL Cholesterol: 48 mg/dL     Total Cholesterol: 132 mg/dL     Objective:  Lab Results  Component Value Date   HGBA1C 7.1 (A) 09/18/2022    Lab Results  Component Value Date   CREATININE 1.31 (H) 12/28/2022   BUN 23 12/28/2022   NA 142 12/28/2022   K 4.3 12/28/2022   CL 103 12/28/2022   CO2 22 12/28/2022    Lab Results  Component Value Date   CHOL 132 09/18/2022   HDL 48 09/18/2022   LDLCALC 70 09/18/2022   LDLDIRECT 105 (H) 12/28/2022   TRIG 66 09/18/2022   CHOLHDL 2.8 09/18/2022    Medications Reviewed Today     Reviewed by Alden Hipp, RPH-CPP (Pharmacist) on 02/09/23 at 1538  Med List Status: <None>   Medication Order Taking? Sig Documenting Provider Last Dose Status Informant  albuterol (VENTOLIN HFA) 108 (90 Base) MCG/ACT  inhaler [id number]  Inhale 2 puffs into the lungs every 6 (six) hours as needed for wheezing or shortness of breath. Donell Beers, FNP  Active   allopurinol (ZYLOPRIM) 100 MG tablet [id number]  Take 1 tablet (100 mg total) by mouth daily. Donell Beers, FNP  Active   atorvastatin (LIPITOR) 20 MG tablet [id number]  Take 1 tablet (20 mg total) by mouth daily. Donell Beers, FNP  Active   BD INSULIN SYRINGE U/F 31G X 5/16" 0.5 ML MISC [id number]  Inject 1 each into the skin 2 (two)  times daily. Donell Beers, FNP  Active   Blood Glucose Monitoring Suppl DEVI [id number]  1 each by Does not apply route in the morning, at noon, and at bedtime. May substitute to any manufacturer covered by patient's insurance. Donell Beers, FNP  Active   diclofenac Sodium (VOLTAREN) 1 % GEL [id number]  Apply 2 g topically 4 (four) times daily.  Patient not taking: Reported on 12/28/2022   Barbette Merino, NP  Active Self  ELIQUIS 5 MG TABS tablet [id number] Yes TAKE 1 TABLET BY MOUTH TWICE A DAY [name], Baird Kay, FNP Taking Active   empagliflozin (JARDIANCE) 25 MG TABS tablet [id number] Yes Take 1 tablet (25 mg total) by mouth daily before breakfast. Donell Beers, FNP Taking Active   gabapentin (NEURONTIN) 100 MG capsule [id number] Yes TAKE 2 CAPSULES BY MOUTH 3 TIMES DAILY. Donell Beers, FNP Taking Active   glucose blood (ACCU-CHEK GUIDE) test strip [id number]  Use as instructed [name], Baird Kay, FNP  Active   insulin glargine (LANTUS SOLOSTAR) 100 UNIT/ML Solostar Pen [id number]  Inject 44 Units into the skin 2 (two) times daily. Donell Beers, FNP  Active   Insulin Pen Needle 29G X MISC [id number]  1 Device by Does not apply route in the morning and at bedtime. Donell Beers, FNP  Active   metFORMIN (GLUCOPHAGE-XR) 500 MG 24 hr tablet [id number] Yes Take 1 tablet (500 mg total) by mouth in the morning and at bedtime. Donell Beers, FNP Taking Active   metoprolol tartrate (LOPRESSOR) 25 MG tablet [id number] Yes Take 0.5 tablets (12.5 mg total) by mouth 2 (two) times daily. Donell Beers, FNP Taking Active   mometasone-formoterol [facility]) 100-5 MCG/ACT AERO [id number]  INHALE 2 PUFFS INTO THE LUNGS TWICE A DAY [name], [name], FNP  Active   pantoprazole (PROTONIX) 40 MG tablet [id number]  Take 1 tablet (40 mg total) by mouth at bedtime. Barbette Merino, NP  Expired 12/28/22 2359   Phenylephrine-DM-GG (MUCINEX FAST-MAX CONGEST COUGH) 2.5-5-100  MG/5ML LIQD [id number]  Take 20 mLs by mouth daily as needed (cough and chest congestion). [provider]  Active Self  potassium chloride SA (KLOR-CON M20) 20 MEQ tablet [id number] Yes Take 1 tablet (20 mEq total) by mouth once for 1 dose. Donell Beers, FNP Taking Active   QUEtiapine (SEROQUEL) 100 MG tablet [id number] Yes Take 1 tablet (100 mg total) by mouth 2 (two) times daily. Donell Beers, FNP Taking Active   senna-docusate (SENOKOT-S) 8.6-50 MG tablet [id number]  Take 2 tablets by mouth at bedtime as needed for moderate constipation. Miguel Rota, MD  Active   sertraline (ZOLOFT) 100 MG tablet [id number] Yes TAKE 1 TABLET BY MOUTH EVERY DAY [name], Baird Kay, FNP Taking Active   torsemide (DEMADEX) 10 MG tablet [id number] Yes Take 1 tablet (10 mg total) by mouth daily. Donell Beers, FNP Taking Active  Assessment/Plan:   Diabetes: - Currently uncontrolled and opportunity for optimization - Reviewed long term cardiovascular and renal outcomes of uncontrolled blood sugar - Reviewed goal A1c, goal fasting, and goal 2 hour post prandial glucose - Reviewed dietary modifications including: focus on lean protein, vegetables, whole grains - Reviewed lifestyle modifications including: remain physically active as able.  - Recommend to start Ozempic 0.25 mg weekly for 4 weeks, then increase to 0.5 mg weekly.  - Patient denies personal or family history of multiple endocrine neoplasia type 2, medullary thyroid cancer; personal history of pancreatitis or gallbladder disease. - Recommend to check glucose periodically, fasting and 2 hour post prandial.    Heart Failure: - Currently controlled, but opportunity for optimization - Recommend to continue current regimen at this time  Hyperlipidemia/ASCVD Risk Reduction: - Currently uncontrolled per last LDL. LDL varies on prior labs. Recommend to increase atorvastatin to 40 mg daily.  - Recommend to  continue current regimen at this time   Follow Up Plan: follow up with pharmacist in 6 weeks  Catie Eppie Gibson, PharmD, BCACP, CPP Clinical Pharmacist [facility] [phone]

## 2023-02-10 ENCOUNTER — Other Ambulatory Visit (HOSPITAL_COMMUNITY): Payer: Self-pay

## 2023-02-11 ENCOUNTER — Other Ambulatory Visit: Payer: Self-pay

## 2023-02-11 ENCOUNTER — Other Ambulatory Visit (HOSPITAL_COMMUNITY): Payer: Self-pay

## 2023-02-11 DIAGNOSIS — I5032 Chronic diastolic (congestive) heart failure: Secondary | ICD-10-CM

## 2023-02-11 DIAGNOSIS — E1100 Type 2 diabetes mellitus with hyperosmolarity without nonketotic hyperglycemic-hyperosmolar coma (NKHHC): Secondary | ICD-10-CM

## 2023-02-11 DIAGNOSIS — M109 Gout, unspecified: Secondary | ICD-10-CM

## 2023-02-11 DIAGNOSIS — F418 Other specified anxiety disorders: Secondary | ICD-10-CM

## 2023-02-11 DIAGNOSIS — J449 Chronic obstructive pulmonary disease, unspecified: Secondary | ICD-10-CM

## 2023-02-11 MED ORDER — ATORVASTATIN CALCIUM 40 MG PO TABS
40.0000 mg | ORAL_TABLET | Freq: Every day | ORAL | 3 refills | Status: DC
  Filled 2023-03-12 (×2): qty 90, 90d supply, fill #0
  Filled 2023-06-11: qty 90, 90d supply, fill #1
  Filled 2023-09-13: qty 90, 90d supply, fill #2
  Filled 2024-01-07: qty 90, 90d supply, fill #3

## 2023-02-11 MED ORDER — ALBUTEROL SULFATE HFA 108 (90 BASE) MCG/ACT IN AERS
2.0000 | INHALATION_SPRAY | Freq: Four times a day (QID) | RESPIRATORY_TRACT | 4 refills | Status: DC | PRN
  Filled 2023-02-11: qty 18, 25d supply, fill #0

## 2023-02-11 MED ORDER — METFORMIN HCL ER 500 MG PO TB24
500.0000 mg | ORAL_TABLET | Freq: Two times a day (BID) | ORAL | 3 refills | Status: DC
  Filled 2023-02-11 – 2023-07-07 (×2): qty 180, 90d supply, fill #0

## 2023-02-11 MED ORDER — LANTUS SOLOSTAR 100 UNIT/ML ~~LOC~~ SOPN
32.0000 [IU] | PEN_INJECTOR | Freq: Two times a day (BID) | SUBCUTANEOUS | 3 refills | Status: DC
  Filled 2023-02-11 – 2023-03-12 (×3): qty 15, 23d supply, fill #0

## 2023-02-11 MED ORDER — OZEMPIC (0.25 OR 0.5 MG/DOSE) 2 MG/3ML ~~LOC~~ SOPN
PEN_INJECTOR | SUBCUTANEOUS | 2 refills | Status: DC
  Filled 2023-02-11 – 2023-03-12 (×2): qty 3, 42d supply, fill #0
  Filled 2023-03-12: qty 3, 28d supply, fill #0

## 2023-02-11 MED ORDER — EMPAGLIFLOZIN 25 MG PO TABS
25.0000 mg | ORAL_TABLET | Freq: Every day | ORAL | 3 refills | Status: DC
Start: 2023-02-11 — End: 2023-06-23
  Filled 2023-02-11 – 2023-04-09 (×3): qty 90, 90d supply, fill #0

## 2023-02-11 MED ORDER — APIXABAN 5 MG PO TABS
5.0000 mg | ORAL_TABLET | Freq: Two times a day (BID) | ORAL | 1 refills | Status: DC
  Filled 2023-02-11 – 2023-03-12 (×3): qty 180, 90d supply, fill #0
  Filled 2023-06-11: qty 180, 90d supply, fill #1

## 2023-02-11 MED ORDER — ACCU-CHEK SOFTCLIX LANCETS MISC
3 refills | Status: DC
  Filled 2023-02-11: qty 100, 100d supply, fill #0
  Filled 2023-03-12: qty 100, 33d supply, fill #0
  Filled 2023-03-30: qty 100, 30d supply, fill #0
  Filled 2023-07-07: qty 100, 34d supply, fill #0

## 2023-02-11 MED ORDER — ALLOPURINOL 100 MG PO TABS
100.0000 mg | ORAL_TABLET | Freq: Every day | ORAL | 0 refills | Status: DC
  Filled 2023-02-11 – 2023-03-12 (×2): qty 90, 90d supply, fill #0

## 2023-02-11 MED ORDER — METOPROLOL TARTRATE 25 MG PO TABS
12.5000 mg | ORAL_TABLET | Freq: Two times a day (BID) | ORAL | 1 refills | Status: DC
  Filled 2023-02-11 – 2023-03-12 (×3): qty 90, 90d supply, fill #0
  Filled 2023-06-11: qty 90, 90d supply, fill #1

## 2023-02-11 MED ORDER — TORSEMIDE 10 MG PO TABS
10.0000 mg | ORAL_TABLET | Freq: Every day | ORAL | 1 refills | Status: DC
Start: 2023-02-11 — End: 2023-09-13
  Filled 2023-02-11 – 2023-04-09 (×3): qty 90, 90d supply, fill #0
  Filled 2023-07-07: qty 90, 90d supply, fill #1

## 2023-02-11 MED ORDER — PEN NEEDLES 32G X 4 MM MISC
3 refills | Status: DC
  Filled 2023-02-11 – 2023-03-12 (×2): qty 100, 90d supply, fill #0

## 2023-02-11 NOTE — Telephone Encounter (Signed)
Done and forwarded the other to provider.

## 2023-02-11 NOTE — Telephone Encounter (Signed)
Please send in if you would like pt to continue. Changing pharmacy. KH

## 2023-02-12 ENCOUNTER — Other Ambulatory Visit (HOSPITAL_COMMUNITY): Payer: Self-pay

## 2023-02-12 MED ORDER — PANTOPRAZOLE SODIUM 40 MG PO TBEC
40.0000 mg | DELAYED_RELEASE_TABLET | Freq: Every day | ORAL | 1 refills | Status: DC
  Filled 2023-02-12: qty 90, 90d supply, fill #0

## 2023-02-12 MED ORDER — DULERA 100-5 MCG/ACT IN AERO
2.0000 | INHALATION_SPRAY | Freq: Two times a day (BID) | RESPIRATORY_TRACT | 2 refills | Status: DC
  Filled 2023-02-12 – 2023-03-12 (×3): qty 13, 30d supply, fill #0
  Filled 2023-06-11: qty 13, 30d supply, fill #1
  Filled 2023-09-13: qty 13, 30d supply, fill #2

## 2023-02-15 ENCOUNTER — Other Ambulatory Visit: Payer: Self-pay | Admitting: Nurse Practitioner

## 2023-02-15 ENCOUNTER — Other Ambulatory Visit (HOSPITAL_COMMUNITY): Payer: Self-pay

## 2023-02-15 MED ORDER — GABAPENTIN 100 MG PO CAPS
100.0000 mg | ORAL_CAPSULE | Freq: Every day | ORAL | 0 refills | Status: AC
  Filled 2023-02-15 – 2023-03-12 (×3): qty 90, 90d supply, fill #0
  Filled 2023-06-11: qty 90, 90d supply, fill #1
  Filled 2023-09-13: qty 90, 90d supply, fill #2
  Filled 2023-12-10: qty 90, 90d supply, fill #3

## 2023-02-15 MED ORDER — SERTRALINE HCL 100 MG PO TABS
100.0000 mg | ORAL_TABLET | Freq: Every day | ORAL | 0 refills | Status: DC
  Filled 2023-02-15 – 2023-03-12 (×4): qty 90, 90d supply, fill #0

## 2023-02-15 MED ORDER — QUETIAPINE FUMARATE 100 MG PO TABS
100.0000 mg | ORAL_TABLET | Freq: Two times a day (BID) | ORAL | 1 refills | Status: DC
  Filled 2023-02-15 – 2023-03-12 (×3): qty 180, 90d supply, fill #0
  Filled 2023-06-11: qty 180, 90d supply, fill #1

## 2023-02-15 MED ORDER — POTASSIUM CHLORIDE CRYS ER 20 MEQ PO TBCR: 20.0000 meq | EXTENDED_RELEASE_TABLET | Freq: Once | ORAL | 1 refills | Status: DC

## 2023-02-15 MED ORDER — POTASSIUM CHLORIDE CRYS ER 20 MEQ PO TBCR
20.0000 meq | EXTENDED_RELEASE_TABLET | Freq: Every day | ORAL | 1 refills | Status: DC
  Filled 2023-02-15: qty 90, 90d supply, fill #0

## 2023-02-16 ENCOUNTER — Other Ambulatory Visit (HOSPITAL_COMMUNITY): Payer: Self-pay

## 2023-02-17 ENCOUNTER — Other Ambulatory Visit (HOSPITAL_COMMUNITY): Payer: Self-pay

## 2023-02-18 ENCOUNTER — Other Ambulatory Visit (HOSPITAL_COMMUNITY): Payer: Self-pay

## 2023-02-19 ENCOUNTER — Other Ambulatory Visit (HOSPITAL_COMMUNITY): Payer: Self-pay

## 2023-03-01 ENCOUNTER — Other Ambulatory Visit: Payer: Self-pay

## 2023-03-03 ENCOUNTER — Other Ambulatory Visit (HOSPITAL_COMMUNITY): Payer: Self-pay

## 2023-03-06 ENCOUNTER — Other Ambulatory Visit (HOSPITAL_COMMUNITY): Payer: Self-pay

## 2023-03-07 NOTE — Progress Notes (Unsigned)
HPI  M former smoker followed for COPD, chronic hypoxic respiratory failure OSA, OHS, complicated by morbid obesity, DM2, CHF, anemia PFT 09/21/13-severe obstruction with slight response to bronchodilator, restriction of exhaled volume, mild reduction diffusion NPSG 08/08/13- AHI 21.1/ hr, desaturation to 80%, body weight 385 lbs BIPAP titration 09/05/15 optimal pressure 22/18, body weight 365 lbs PFT 10/11/2018- Severe obstruction with response to dilator. Severe restriction, diffusion moderately reduced. FVC 2.16/ 57%, FEV1 1.77/ 59%, Ratio 0.82, TLC 66%, DLCO 66% -------------------------------------------------------- 11/24/19-63 year old male former smoker followed for COPD, hypoxic respiratory failure/ Tracheostomy/ Tracheal Stenosis, OSA, complicated by morbid obesity, OHS, DM2, CHF/ atherosclerosis, hx Thyroidectomy for goiter, COPD, Morbid Obesity,  (BIPAP 22/18/ [facility]/ [facility] )- tracheostomy now Tracheostomy revision by Dr Pollyann Kennedy 8/11 ED 10/28 to replace dislodged trach tube     # 5 Extra Long Shiley Dulera 100, Neb Duoneb,  Body weight today- 333 lbs Covid vax- none Flu vax- had Follows with Tr[facility]ostomy Clinic- Anders Simmonds NP told him to expect to need trach another 6 months, which will be largely contingent on his ability to keep weight down.  Has old O2 concentrator, not using. Sleeps with trach open, humidified room air by trach mask. Does well during day with PM valve on trach tube. PICC line and feeding tube are out. Using [facility]. Rarely uses nebulizer- has meds.   03/08/13- 63 year old male former smoker followed for COPD, hypoxic respiratory failure/ Tracheostomy/ Tracheal Stenosis, OSA, complicated by morbid obesity, OHS, DM2, CHF/ atherosclerosis, hx Thyroidectomy for goiter,HF, DVT (s/p thrombectomy) and balloon angioplasty on Eliquis , Referred back now by Dr Rexanne Mano Patel/ [facility]. Wants to be decannulated but has OSA. (BIPAP 22/18/ [facility]/ [facility] )- tracheostomy now Current  trach: [id number] - looks like last documented change 05/2020; uses PMV  Dulera 100, Neb Duoneb, Ventolin hfa,  Body weight today- 339 lbs CXR 1V - 09/07/22 MPRESSION: No acute airspace disease. Discussed the use of AI scribe software for clinical note transcription with the patient, who gave verbal consent to proceed.  History of Present Illness   The patient, with a history of significant sleep apnea managed with a tracheostomy, presents for follow-up. He reports that he no longer uses his BiPAP machine and has been managing his tracheostomy independently, using a speaking valve during the day and plugging the tracheostomy at night. He reports no significant difference in his sleep quality or daytime alertness with the tracheostomy plugged or unplugged. He has recently moved to a new apartment and reports improved sleep quality since the move. He denies using any sleep aids and reports consuming four cups of coffee each morning. He also reports occasional daytime napping. He is followed at [facility].  In addition to his sleep apnea, the patient has been managing a hearing loss in his right ear, which he attributes to wax build-up. He has been advised to purchase an over-the-counter earwax removal kit. He also reports a recent visit to his primary care provider for a blood draw and has been managing his diabetes with insulin, for which he needs new pen needles.     ROS-see HPI    + = positive Constitutional:    +weight loss, night sweats, fevers, chills, + fatigue, lassitude. HEENT:   No-  headaches, difficulty swallowing, tooth/dental problems, sore throat,      sneezing, itching, ear ache, nasal congestion, post nasal drip,  CV:  No-   chest pain, +orthopnea, PND, swelling in lower extremities, no-anasarca, dizziness, palpitations Resp: +shortness of breath with exertion  or at rest.                productive cough,  + non-productive cough,  No- coughing up of blood.              No-    change in color of mucus.   wheezing.   Skin: No-   rash or lesions. GI:  No-   heartburn, indigestion, abdominal pain,no- nausea, vomiting,  GU:  MS:  + joint pain or swelling.  . Neuro-     nothing unusual Psych:  No- change in mood or affect. No depression or anxiety.  No memory loss.  OBJ- Physical Exam General- Alert, Oriented, Affect-appropriate, Distress- none acute,  + morbidly obese,   . 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 Lymphadenopathy- none Head- atraumatic            Eyes- Gross vision intact, PERRLA, conjunctivae and secretions clear            Ears- Hearing, canals-normal            Nose- Clear, no-Septal dev, mucus, polyps, erosion, perforation             Throat- Mallampati IV , mucosa clear , drainage- none, tonsils- atrophic,  Neck- + Breathing unlabored at rest on room air. +tracheostomy/ PMValve. Chest - symmetrical excursion , unlabored           Heart/CV- RRR, distant , no murmur , no gallop  , no rub, nl s1 s2                           - JVD- none , edema+ 1-2, stasis changes- none, varices- none           Lung- diminished/clear , wheeze- none, cough+raspy / stridorous , dullness-none, rub- none                                   Chest wall-  Abd-  Br/ Gen/ Rectal- Not done, not indicated Extrem- cyanosis- none, clubbing, none, atrophy- none, strength- nl, + cane Neuro- grossly intact to observation  Assessment and Plan    Tracheostomy Patient with tracheostomy, not using BiPAP machine. No significant difference in sleep quality with tracheostomy plugged or unplugged. -Plan for sleep study with tracheostomy plugged to assess oxygen levels and breathing during sleep.  Sleep Apnea Patient reports improved sleep since moving to a new apartment. Consumes four cups of coffee in the morning. -Continue monitoring sleep quality.  Ear Wax Impaction Patient reports decreased hearing in right ear. Examination reveals significant  ear wax impaction. -Recommend over-the-counter ear wax removal kit.  Asthma Patient requires refill of rescue inhaler. -Send prescription for rescue inhaler to [facility].  General Health Maintenance -Order chest x-ray, last done approximately a year ago. -Plan for follow-up with [facility] after current visit.

## 2023-03-08 ENCOUNTER — Other Ambulatory Visit: Payer: Self-pay | Admitting: Nurse Practitioner

## 2023-03-08 ENCOUNTER — Other Ambulatory Visit: Payer: Medicaid Other

## 2023-03-08 ENCOUNTER — Other Ambulatory Visit (HOSPITAL_COMMUNITY): Payer: Self-pay

## 2023-03-08 DIAGNOSIS — E785 Hyperlipidemia, unspecified: Secondary | ICD-10-CM | POA: Diagnosis not present

## 2023-03-08 LAB — POCT GLYCOSYLATED HEMOGLOBIN (HGB A1C): Hemoglobin A1C: 6.7 % — AB (ref 4.0–5.6)

## 2023-03-08 NOTE — Addendum Note (Signed)
Addended by: Renelda Loma on: 03/08/2023 08:51 AM   Modules accepted: Orders

## 2023-03-09 ENCOUNTER — Ambulatory Visit: Payer: Medicaid Other | Admitting: Internal Medicine

## 2023-03-09 ENCOUNTER — Encounter: Payer: Self-pay | Admitting: Internal Medicine

## 2023-03-09 ENCOUNTER — Other Ambulatory Visit (HOSPITAL_COMMUNITY): Payer: Self-pay

## 2023-03-09 VITALS — BP 124/82 | HR 77 | Ht 68.0 in | Wt 339.6 lb

## 2023-03-09 DIAGNOSIS — Z23 Encounter for immunization: Secondary | ICD-10-CM | POA: Diagnosis not present

## 2023-03-09 DIAGNOSIS — J449 Chronic obstructive pulmonary disease, unspecified: Secondary | ICD-10-CM

## 2023-03-09 DIAGNOSIS — G4733 Obstructive sleep apnea (adult) (pediatric): Secondary | ICD-10-CM

## 2023-03-09 LAB — LIPID PANEL
Chol/HDL Ratio: 3.2 {ratio} (ref 0.0–5.0)
Cholesterol, Total: 145 mg/dL (ref 100–199)
HDL: 45 mg/dL (ref >39)
LDL Chol Calc (NIH): 82 mg/dL (ref 0–99)
Triglycerides: 96 mg/dL (ref 0–149)
VLDL Cholesterol Cal: 18 mg/dL (ref 5–40)

## 2023-03-09 MED ORDER — ALBUTEROL SULFATE HFA 108 (90 BASE) MCG/ACT IN AERS
2.0000 | INHALATION_SPRAY | Freq: Four times a day (QID) | RESPIRATORY_TRACT | 12 refills | Status: AC | PRN
  Filled 2023-03-09: qty 18, 30d supply, fill #0
  Filled 2023-03-12: qty 18, 25d supply, fill #0
  Filled 2023-06-11: qty 18, 25d supply, fill #1

## 2023-03-09 NOTE — Patient Instructions (Addendum)
Order- schedule NPSG in-center with tracheostomy plugged dx OSA, chronic respiratory failure with hypoxia. Unplug if severe apnea.  Script sent refilling albuterol rescue inhaler

## 2023-03-10 ENCOUNTER — Encounter: Payer: Self-pay | Admitting: Internal Medicine

## 2023-03-11 ENCOUNTER — Other Ambulatory Visit (HOSPITAL_COMMUNITY): Payer: Self-pay

## 2023-03-12 ENCOUNTER — Other Ambulatory Visit: Payer: Self-pay

## 2023-03-12 ENCOUNTER — Other Ambulatory Visit (HOSPITAL_COMMUNITY): Payer: Self-pay

## 2023-03-29 ENCOUNTER — Other Ambulatory Visit: Payer: Self-pay

## 2023-03-29 ENCOUNTER — Other Ambulatory Visit (HOSPITAL_COMMUNITY): Payer: Self-pay

## 2023-03-29 NOTE — Progress Notes (Signed)
 03/29/2023 Name: Timothy Le MRN: [id number] DOB: 23-Jan-1960  No chief complaint on file.   Timothy Le is a 63 y.o. year old male who presented for a telephone visit.   They were referred to the pharmacist by their PCP for assistance in managing diabetes.    Subjective:  Care Team: Primary Care Provider: Donell Beers, FNP ; Next Scheduled Visit: 05/05/2023  Medication Access/Adherence  Current Pharmacy:  [facility] [address] [place] [address] Phone: [phone] Fax: [phone]   Patient reports affordability concerns with their medications: No  Patient reports access/transportation concerns to their pharmacy: No  Patient reports adherence concerns with their medications:  No     Diabetes:  Current medications: Ozempic 0.25 mg weekly (Saturdays), Jardiance 25 mg daily, metformin XR 500 mg BID, Lantus 44 units daily (patient decreased dose to 34 units daily yesterday)  Patient reports some diarrhea with first Ozempic injection, but this has not recurred. Feels that he is tolerating Ozempic well, has completed 2 injections. Patient reports an episode of dizziness/shakiness so he decreased the dose of Lantus yesterday.  Current glucose readings: not checking because he does not have a glucometer  Patient denies hyperglycemic symptoms including polyuria, polydipsia, polyphagia, nocturia, neuropathy, blurred vision.  Current meal patterns: 2 meals/day - Breakfast: sausage, eggs - Supper: [place] burger, salad - Snacks: sticky buns - 2 per day, knows he needs to cut back - Drinks: water, fruit juice once a day, coffee with cream and splenda  Hyperlipidemia/ASCVD Risk Reduction  Current lipid lowering medications: atorvastatin 40 mg daily  The 10-year ASCVD risk score (Arnett DK, et al., 2019) is: 22.3%   Values used to calculate the score:     Age: 4 years     Sex: Male     Is Non-Hispanic African  American: Yes     Diabetic: Yes     Tobacco smoker: No     Systolic Blood Pressure: 124 mmHg     Is BP treated: Yes     HDL Cholesterol: 45 mg/dL     Total Cholesterol: 145 mg/dL    Objective:  Lab Results  Component Value Date   HGBA1C 6.7 (A) 12/28/2022    Lab Results  Component Value Date   CREATININE 1.31 (H) 12/28/2022   BUN 23 12/28/2022   NA 142 12/28/2022   K 4.3 12/28/2022   CL 103 12/28/2022   CO2 22 12/28/2022    Lab Results  Component Value Date   CHOL 145 03/08/2023   HDL 45 03/08/2023   LDLCALC 82 03/08/2023   LDLDIRECT 105 (H) 12/28/2022   TRIG 96 03/08/2023   CHOLHDL 3.2 03/08/2023    Medications Reviewed Today     Reviewed by Vela Prose, RPH (Pharmacist) on 03/29/23 at 1855  Med List Status: <None>   Medication Order Taking? Sig Documenting Provider Last Dose Status Informant  Accu-Chek Softclix Lancets lancets [id number]  Use to check blood sugar three times daily. Donell Beers, FNP  Active   albuterol (VENTOLIN HFA) 108 (90 Base) MCG/ACT inhaler [id number]  Inhale 2 puffs into the lungs every 6 (six) hours as needed for wheezing or shortness of breath. Jetty Duhamel D, MD  Active   allopurinol (ZYLOPRIM) 100 MG tablet [id number]  Take 1 tablet (100 mg total) by mouth daily. Donell Beers, FNP  Active   apixaban (ELIQUIS) 5 MG TABS tablet [id number]  Take 1 tablet (5 mg total) by  mouth 2 (two) times daily. Donell Beers, FNP  Active   atorvastatin (LIPITOR) 40 MG tablet [id number] Yes Take 1 tablet (40 mg total) by mouth daily. Donell Beers, FNP Taking Active   Blood Glucose Monitoring Suppl DEVI [id number]  Use to check blood sugar three times daily. May substitute to any manufacturer covered by patient's insurance. Donell Beers, FNP  Active   diclofenac Sodium (VOLTAREN) 1 % GEL [id number]  Apply 2 g topically 4 (four) times daily. Barbette Merino, NP  Active Self  empagliflozin (JARDIANCE) 25 MG TABS tablet  [id number] Yes Take 1 tablet (25 mg total) by mouth daily before breakfast. Donell Beers, FNP Taking Active   gabapentin (NEURONTIN) 100 MG capsule [id number]  Take 1 capsule (100 mg total) by mouth daily. Donell Beers, FNP  Active   Glucose Blood (BLOOD GLUCOSE TEST STRIPS) STRP [id number]  Use to check blood sugar three times daily. Donell Beers, FNP  Active   insulin glargine (LANTUS SOLOSTAR) 100 UNIT/ML Solostar Pen [id number]  Inject 32 Units into the skin 2 (two) times daily. Donell Beers, FNP  Active   Insulin Pen Needle (PEN NEEDLES) 32G X 4 MM MISC [id number]  Use to inject Lantus daily Donell Beers, FNP  Active   Lancet Device MISC [id number]  Use to check blood sugar three times daily. May substitute to any manufacturer covered by patient's insurance. Donell Beers, FNP  Active   metFORMIN (GLUCOPHAGE-XR) 500 MG 24 hr tablet [id number] Yes Take 1 tablet (500 mg total) by mouth in the morning and at bedtime. Donell Beers, FNP Taking Active   metoprolol tartrate (LOPRESSOR) 25 MG tablet [id number]  Take 0.5 tablets (12.5 mg total) by mouth 2 (two) times daily. Donell Beers, FNP  Active   mometasone-formoterol [facility]) 100-5 MCG/ACT Sandrea Matte [id number]  Inhale 2 puffs into the lungs 2 (two) times daily. Donell Beers, FNP  Active   pantoprazole (PROTONIX) 40 MG tablet [id number]  Take 1 tablet (40 mg total) by mouth at bedtime. Donell Beers, FNP  Active   Phenylephrine-DM-GG (MUCINEX FAST-MAX CONGEST COUGH) 2.5-5-100 MG/5ML LIQD [id number]  Take 20 mLs by mouth daily as needed (cough and chest congestion).  Patient not taking: Reported on 03/09/2023   [provider]  Active Self  potassium chloride SA (KLOR-CON M20) 20 MEQ tablet [id number]  Take 1 tablet (20 mEq total) by mouth daily. Donell Beers, FNP  Active   QUEtiapine (SEROQUEL) 100 MG tablet [id number]  Take 1 tablet (100 mg total) by mouth 2 (two) times daily. Donell Beers, FNP  Active   Semaglutide,0.25 or 0.5MG /DOS, (OZEMPIC, 0.25 OR 0.5 MG/DOSE,) 2 MG/3ML SOPN [id number] Yes Inject 0.25 mg weekly for 4 weeks, then increase to 0.5 mg weekly [name], Baird Kay, FNP Taking Active   senna-docusate (SENOKOT-S) 8.6-50 MG tablet [id number]  Take 2 tablets by mouth at bedtime as needed for moderate constipation. Miguel Rota, MD  Active   sertraline (ZOLOFT) 100 MG tablet [id number]  Take 1 tablet (100 mg total) by mouth daily. Donell Beers, FNP  Active   torsemide (DEMADEX) 10 MG tablet [id number]  Take 1 tablet (10 mg total) by mouth daily. Donell Beers, FNP  Active               Assessment/Plan:   Diabetes: - Currently controlled based on last A1c 6.7%, below goal <7%. Patient is a great candidate for GLP-1 given BMI  52. He is tolerating Ozempic well so far. Recommended to complete 2 more doses of 0.25 mg weekly then increase to 0.5 mg weekly and he expressed understanding. Given episode of dizziness/shakiness with starting Ozempic, recommend to decrease dose of Lantus. Glucometer and testing supplies were ordered during last pharmacist visit, but patient states he did not receive this from [facility] pharmacy. Encouraged him to reach out to pharmacy to request they fill the supplies and counseled on the importance of blood glucose monitoring. Will also message [facility] pharmacy to request fills. - Reviewed long term cardiovascular and renal outcomes of uncontrolled blood sugar - Reviewed goal A1c, goal fasting, and goal 2 hour post prandial glucose - Reviewed dietary modifications including cutting back on sticky buns - Recommend to decrease Lantus to 40 units daily - Recommend to continue Ozempic 0.25 mg for 2 more weeks then increase to 0.5 mg weekly - Recommend to continue metformin XR 500 mg BID  - Recommend to continue Jardiance 25 mg daily - Patient denies personal or family history of multiple endocrine neoplasia type 2, medullary thyroid  cancer; personal history of pancreatitis or gallbladder disease. - Recommend to check fasting and 2 hr post prandial blood glucose daily     Hyperlipidemia/ASCVD Risk Reduction: - Currently uncontrolled with last LDL 82 mg/dL, above goal <40 mg/dL given dx of DM. Since then atorvastatin has been increased from 20 mg to 40 mg. Patient confirmed he has increased dose and has been taking it.  - Reviewed long term complications of uncontrolled cholesterol - Reviewed dietary recommendations including cutting back on sticky buns - Recommend to continue atorvastatin 40 mg daily  - Updated lipid panel due at next PCP visit   Follow Up Plan: PharmD visit on 04/12/23 and PCP visit on 05/05/23  Jarrett Ables, PharmD PGY-1 Pharmacy Resident

## 2023-03-30 ENCOUNTER — Other Ambulatory Visit (HOSPITAL_COMMUNITY): Payer: Self-pay

## 2023-03-30 ENCOUNTER — Telehealth: Payer: Self-pay | Admitting: Nurse Practitioner

## 2023-03-30 NOTE — Telephone Encounter (Signed)
 Returning miss call from the office

## 2023-03-31 ENCOUNTER — Ambulatory Visit: Admitting: Podiatry

## 2023-03-31 ENCOUNTER — Ambulatory Visit (INDEPENDENT_AMBULATORY_CARE_PROVIDER_SITE_OTHER): Admitting: Podiatry

## 2023-03-31 ENCOUNTER — Encounter: Payer: Self-pay | Admitting: Podiatry

## 2023-03-31 VITALS — Ht 68.0 in | Wt 339.0 lb

## 2023-03-31 DIAGNOSIS — Z794 Long term (current) use of insulin: Secondary | ICD-10-CM

## 2023-03-31 DIAGNOSIS — B351 Tinea unguium: Secondary | ICD-10-CM | POA: Diagnosis not present

## 2023-03-31 DIAGNOSIS — M79674 Pain in right toe(s): Secondary | ICD-10-CM

## 2023-03-31 DIAGNOSIS — M79675 Pain in left toe(s): Secondary | ICD-10-CM | POA: Diagnosis not present

## 2023-03-31 DIAGNOSIS — E1165 Type 2 diabetes mellitus with hyperglycemia: Secondary | ICD-10-CM

## 2023-03-31 DIAGNOSIS — Z7901 Long term (current) use of anticoagulants: Secondary | ICD-10-CM

## 2023-03-31 NOTE — Progress Notes (Signed)
 This patient returns to my office for at risk foot care.  This patient requires this care by a professional since this patient will be at risk due to having diabetes  This patient is unable to cut nails himself since the patient cannot reach his nails.These nails are painful walking and wearing shoes.  This patient presents for at risk foot care today.  General Appearance  Alert, conversant and in no acute stress.  Vascular  Dorsalis pedis and posterior tibial  pulses are  not   bilaterally due to severe swelling..  Capillary return is within normal limits  bilaterally. Temperature is within normal limits  bilaterally.  Neurologic  Senn-Weinstein monofilament wire test within normal limits  bilaterally. Muscle power within normal limits bilaterally.  Nails Thick disfigured discolored nails with subungual debris  from hallux to fifth toes bilaterally. No evidence of bacterial infection or drainage bilaterally.  Orthopedic  No limitations of motion  feet .  No crepitus or effusions noted.  No bony pathology or digital deformities noted.  HAV  B/L.  Skin  normotropic skin with no porokeratosis noted bilaterally.  No signs of infections or ulcers noted.   Plantar callus along weight bearing areas both feet.  Onychomycosis  Pain in right toes  Pain in left toes  Consent was obtained for treatment procedures.   Mechanical debridement of nails 1-5  bilaterally performed with a nail nipper.  Filed with dremel without incident.    Return office visit      4  months              Told patient to return for periodic foot care and evaluation due to potential at risk complications.   Helane Gunther DPM

## 2023-04-01 NOTE — Telephone Encounter (Signed)
 Called back back. No note of a call. May not have been from our office. KH

## 2023-04-08 ENCOUNTER — Other Ambulatory Visit: Payer: Self-pay | Admitting: Nurse Practitioner

## 2023-04-08 DIAGNOSIS — M109 Gout, unspecified: Secondary | ICD-10-CM

## 2023-04-09 ENCOUNTER — Other Ambulatory Visit (HOSPITAL_COMMUNITY): Payer: Self-pay

## 2023-04-09 ENCOUNTER — Other Ambulatory Visit: Payer: Self-pay | Admitting: Nurse Practitioner

## 2023-04-09 ENCOUNTER — Other Ambulatory Visit: Payer: Self-pay

## 2023-04-09 DIAGNOSIS — M109 Gout, unspecified: Secondary | ICD-10-CM

## 2023-04-09 MED ORDER — ALLOPURINOL 100 MG PO TABS
100.0000 mg | ORAL_TABLET | Freq: Every day | ORAL | 0 refills | Status: DC
  Filled 2023-04-09: qty 90, 90d supply, fill #0

## 2023-04-12 ENCOUNTER — Other Ambulatory Visit: Payer: Self-pay

## 2023-04-12 NOTE — Progress Notes (Signed)
 04/12/2023 Name: Timothy Le MRN: [id number] DOB: 1960-03-16  Chief Complaint  Patient presents with   Diabetes   Hyperlipidemia    Timothy Le is a 63 y.o. year old male who presented for a telephone visit.   They were referred to the pharmacist by their PCP for assistance in managing diabetes.   Subjective:  Care Team: Primary Care Provider: Donell Beers, FNP ; Next Scheduled Visit: 05/05/23  Medication Access/Adherence  Current Pharmacy:  [facility] - [facility] [address] [place] [address] [address] Phone: [phone] Fax: [phone]  [id number] - [place], [place] - [address] AT [facility] [address] [place] [address] Phone: [phone] Fax: [phone]   Patient reports affordability concerns with their medications: No  Patient reports access/transportation concerns to their pharmacy: No  Patient reports adherence concerns with their medications:  No     Diabetes:  Current medications: Ozempic 0.25 mg weekly (Saturdays), Jardiance 25 mg daily, metformin XR 500 mg BID (taking once daily), Lantus 44 units daily (forgot to decrease the dose, but says he is tolerating this well, no further episodes of dizziness/shakiness)  Patient reports he has completed 4 doses of Ozempic 0.25 mg. Says that the day after taking his Ozempic he has mild loose stool, but this is tolerable for him - will have two bowel movements that day instead of one. Still has not been able to get testing supplies, not able to monitor blood glucose.  Patient denies hypoglycemic s/sx including dizziness, shakiness, sweating. Patient denies hyperglycemic symptoms including polyuria, polydipsia, polyphagia, nocturia, neuropathy, blurred vision.  Current meal patterns: 2 meals/day - Breakfast: sausage, eggs - Supper: [place] burger, salad - Snacks: previously eating 2 stick buns/day but says he has cut back  - only had 1 in the past two weeks - Drinks: water, fruit juice once a day, coffee with cream and splenda  Hyperlipidemia/ASCVD Risk Reduction  Current lipid lowering medications: atorvastatin 40 mg daily  The 10-year ASCVD risk score (Arnett DK, et al., 2019) is: 22.3%   Values used to calculate the score:     Age: 3 years     Sex: Male     Is Non-Hispanic African American: Yes     Diabetic: Yes     Tobacco smoker: No     Systolic Blood Pressure: 124 mmHg     Is BP treated: Yes     HDL Cholesterol: 45 mg/dL     Total Cholesterol: 145 mg/dL   Objective:  Lab Results  Component Value Date   HGBA1C 6.7 (A) 12/28/2022    Lab Results  Component Value Date   CREATININE 1.31 (H) 12/28/2022   BUN 23 12/28/2022   NA 142 12/28/2022   K 4.3 12/28/2022   CL 103 12/28/2022   CO2 22 12/28/2022    Lab Results  Component Value Date   CHOL 145 03/08/2023   HDL 45 03/08/2023   LDLCALC 82 03/08/2023   LDLDIRECT 105 (H) 12/28/2022   TRIG 96 03/08/2023   CHOLHDL 3.2 03/08/2023    Medications Reviewed Today     Reviewed by Vela Prose, RPH (Pharmacist) on 04/12/23 at 1424  Med List Status: <None>   Medication Order Taking? Sig Documenting Provider Last Dose Status Informant  Accu-Chek Softclix Lancets lancets [id number]  Use to check blood sugar three times daily. Donell Beers, FNP  Active   albuterol (VENTOLIN HFA) 108 (90 Base) MCG/ACT inhaler  [id number]  Inhale 2 puffs into the lungs every 6 (six) hours as needed for wheezing or shortness of breath. Jetty Duhamel D, MD  Active   allopurinol (ZYLOPRIM) 100 MG tablet [id number]  TAKE 1 TABLET BY MOUTH EVERY DAY [name], [name], FNP  Active   allopurinol (ZYLOPRIM) 100 MG tablet [id number]  Take 1 tablet (100 mg total) by mouth daily. Ivonne Andrew, NP  Active   apixaban (ELIQUIS) 5 MG TABS tablet [id number]  Take 1 tablet (5 mg total) by mouth 2 (two) times daily. Donell Beers, FNP  Active   atorvastatin  (LIPITOR) 40 MG tablet [id number] Yes Take 1 tablet (40 mg total) by mouth daily. Donell Beers, FNP Taking Active   Blood Glucose Monitoring Suppl (BLOOD GLUCOSE MONITOR SYSTEM) w/Device KIT [id number]  Use to monitor blodd glucose 3 times daily [name], [name], FNP  Active   diclofenac Sodium (VOLTAREN) 1 % GEL [id number]  Apply 2 g topically 4 (four) times daily. Barbette Merino, NP  Active Self  empagliflozin (JARDIANCE) 25 MG TABS tablet [id number] Yes Take 1 tablet (25 mg total) by mouth daily before breakfast. Donell Beers, FNP Taking Active   gabapentin (NEURONTIN) 100 MG capsule [id number]  Take 1 capsule (100 mg total) by mouth daily. Donell Beers, FNP  Active   Glucose Blood (BLOOD GLUCOSE TEST STRIPS) STRP [id number]  Use to check blood sugar three times daily. Donell Beers, FNP  Active   insulin glargine (LANTUS SOLOSTAR) 100 UNIT/ML Solostar Pen [id number] Yes Inject 32 Units into the skin 2 (two) times daily.  Patient taking differently: Inject 44 Units into the skin daily.   Donell Beers, FNP Taking Active   Insulin Pen Needle (PEN NEEDLES) 32G X 4 MM MISC [id number]  Use to inject Lantus daily Donell Beers, FNP  Active   Lancet Device MISC [id number]  Use to check blood sugar three times daily. May substitute to any manufacturer covered by patient's insurance. Donell Beers, FNP  Active   metFORMIN (GLUCOPHAGE-XR) 500 MG 24 hr tablet [id number] Yes Take 1 tablet (500 mg total) by mouth in the morning and at bedtime. Donell Beers, FNP Taking Active            Med Note Vela Prose   Mon Apr 12, 2023  2:21 PM) Taking one tablet daily  metoprolol tartrate (LOPRESSOR) 25 MG tablet [id number]  Take 0.5 tablets (12.5 mg total) by mouth 2 (two) times daily. Donell Beers, FNP  Active   mometasone-formoterol [facility]) 100-5 MCG/ACT Sandrea Matte [id number]  Inhale 2 puffs into the lungs 2 (two) times daily. Donell Beers, FNP  Active    pantoprazole (PROTONIX) 40 MG tablet [id number]  Take 1 tablet (40 mg total) by mouth at bedtime. Donell Beers, FNP  Active   Phenylephrine-DM-GG (MUCINEX FAST-MAX CONGEST COUGH) 2.5-5-100 MG/5ML LIQD [id number]  Take 20 mLs by mouth daily as needed (cough and chest congestion). [provider]  Active Self  potassium chloride SA (KLOR-CON M20) 20 MEQ tablet [id number]  Take 1 tablet (20 mEq total) by mouth daily. Donell Beers, FNP  Active   QUEtiapine (SEROQUEL) 100 MG tablet [id number]  Take 1 tablet (100 mg total) by mouth 2 (two) times daily. Donell Beers, FNP  Active   Semaglutide,0.25 or 0.5MG /DOS, (OZEMPIC, 0.25 OR 0.5 MG/DOSE,) 2 MG/3ML SOPN [id number] Yes Inject 0.25 mg weekly for 4 weeks, then increase to 0.5 mg weekly Edwin Dada  R, FNP Taking Active   senna-docusate (SENOKOT-S) 8.6-50 MG tablet [id number]  Take 2 tablets by mouth at bedtime as needed for moderate constipation. Miguel Rota, MD  Active   sertraline (ZOLOFT) 100 MG tablet [id number]  Take 1 tablet (100 mg total) by mouth daily. Donell Beers, FNP  Active   torsemide (DEMADEX) 10 MG tablet [id number]  Take 1 tablet (10 mg total) by mouth daily. Donell Beers, FNP  Active              Assessment/Plan:   Diabetes: - Currently controlled based on last A1c 6.7%, below goal <7%. Patient is a great candidate for GLP-1 given BMI 52. He is tolerating Ozempic well so far and has completed 4 doses of 0.25 mg. Given he is on insulin, last A1c controlled, and not currently able to monitor blood glucose, hesitant to increase to 0.5 mg at this time which has greater glycemic benefit. Will need home blood glucose readings as he will likely need an insulin adjustment with increasing the Ozempic dose. Messaged [facility] pharmacy to determine why he has not been able to get testing supplies with Medicaid. Informed that Medicaid is rejecting saying patient is institutionalized, but he currently lives at  home. Counseled patient to reach out to Medicaid to clarify this then request [facility] fill supplies. Encouraged him to reach out sooner if he is still unable to get supplies. Close follow up to ensure he is able to complete this and receive supplies. - Reviewed long term cardiovascular and renal outcomes of uncontrolled blood sugar - Reviewed goal A1c, goal fasting, and goal 2 hour post prandial glucose - Reviewed dietary modifications including commended him for cutting back significantly on stick buns! - Recommend to continue Ozempic 0.25 mg weekly - Recommend to continue Jardiance 25 mg daily - Recommend to continue Lantus 44 units daily - Recommend to continue metformin XR 500 mg BID. Encouraged adherence. - Patient denies personal or family history of multiple endocrine neoplasia type 2, medullary thyroid cancer; personal history of pancreatitis or gallbladder disease. - Recommend to check fasting blood glucose daily.    Hyperlipidemia/ASCVD Risk Reduction:  Currently uncontrolled with last LDL 82 mg/dL, above goal <93 mg/dL given dx of DM. Since then atorvastatin has been increased from 20 mg to 40 mg. Patient confirmed he has increased dose and has been taking it.  - Reviewed long term complications of uncontrolled cholesterol - Reviewed dietary recommendations including cutting back on sticky buns - Recommend to continue atorvastatin 40 mg daily  - Updated lipid panel due at next PCP visit   Follow Up Plan: PharmD next week on 04/20/23 and PCP on 05/05/23  Jarrett Ables, PharmD PGY-1 Pharmacy Resident

## 2023-04-20 ENCOUNTER — Other Ambulatory Visit: Payer: Self-pay

## 2023-04-20 ENCOUNTER — Other Ambulatory Visit (HOSPITAL_COMMUNITY): Payer: Self-pay

## 2023-04-20 DIAGNOSIS — Z794 Long term (current) use of insulin: Secondary | ICD-10-CM

## 2023-04-20 DIAGNOSIS — E1100 Type 2 diabetes mellitus with hyperosmolarity without nonketotic hyperglycemic-hyperosmolar coma (NKHHC): Secondary | ICD-10-CM

## 2023-04-20 MED ORDER — LANTUS SOLOSTAR 100 UNIT/ML ~~LOC~~ SOPN
42.0000 [IU] | PEN_INJECTOR | Freq: Every day | SUBCUTANEOUS | 3 refills | Status: DC
  Filled 2023-04-20: qty 15, 36d supply, fill #0
  Filled 2023-06-11: qty 15, 36d supply, fill #1

## 2023-04-20 MED ORDER — OZEMPIC (0.25 OR 0.5 MG/DOSE) 2 MG/3ML ~~LOC~~ SOPN
0.5000 mg | PEN_INJECTOR | SUBCUTANEOUS | 2 refills | Status: DC
  Filled 2023-04-20: qty 3, 28d supply, fill #0
  Filled 2023-05-18: qty 6, 56d supply, fill #1

## 2023-04-20 NOTE — Progress Notes (Unsigned)
 04/20/2023 Name: Timothy Le MRN: [id number] DOB: October 30, 1960  Chief Complaint  Patient presents with   Diabetes   Weight Management Screening    Timothy Le is a 63 y.o. year old male who presented for a telephone visit. PMH includes HFpEF, severe OSA, HTN, COPD, chronic respiratory failure with tracheostomy (followed at [facility] tracheostomy clinic), T2DM, BMI > 50.   They were referred to the pharmacist by their PCP for assistance in managing diabetes.   Subjective: Patient reports doing well today. He reports that he tried to call his insurance to resolve claim issue with DM testing supplies (glucometer being denied with rejection error indicating patient is institutionalized, but he has been at home > 5 mo), but he was not able to reach the correct person.   He notes that he is due for a diabetic eye exam, and needs to follow-up with pulmonology about missing a piece for his CPAP.   Care Team: Primary Care Provider: Donell Beers, FNP ; Next Scheduled Visit: 05/05/23 Pulmonology: Jetty Duhamel, MD: Next Scheduled Visit: 05/06/23  Medication Access/Adherence  Current Pharmacy:  [facility] - [facility] [address] [place] [address] Phone: [phone] Fax: [phone]  [id number] [place], [place] - [address] AT [facility] [address] [place] [address] Phone: [phone] Fax: [phone]   Patient reports affordability concerns with their medications: No  Patient reports access/transportation concerns to their pharmacy: No  Patient reports adherence concerns with their medications:  No     Diabetes:  Current medications: Ozempic 0.5 mg weekly (Saturdays - took first dose of 0.5 mg this past Saturday 04/18/23), Jardiance 25 mg daily, metformin XR 500 mg BID (taking once daily), Lantus 41-42 units daily (self-decreased Lantus since increasing to Ozempic 0.5 mg)  Patient  reports he increased Ozempic to 0.5 mg after taking fourth dose of 0.25 mg. He denies GI AE, and notes a decrease in his diet. Reports that last night his portion size was reduced to a few potato wedges, a couple pieces of chicken wings, and half a brownie, and he stopped eating at the point of feeling full.    Still has not been able to get testing supplies, not able to monitor blood glucose - he tried to called Medicaid, but could not figure out how to reach the correct person. Willing to do a three-way call with Medicaid representative during appointment today.   He reports that he is not interested in CGM at this time. He has a flip phone, and does not want to use a reader to check the values.   Patient denies hypoglycemic s/sx including dizziness, shakiness, sweating. Patient denies hyperglycemic symptoms including polyuria, polydipsia, polyphagia, nocturia, neuropathy, blurred vision. He does report that he sees some black spots in his vision, but this is usual for him - he is due for ophthalmology appt.   Current meal patterns: 2 meals/day - Breakfast: sausage, eggs - Supper: [place] burger, salad - Snacks: previously eating 2 sticky buns/day but says he has cut back - Drinks: water, fruit juice once a day, coffee with cream and splenda  Hyperlipidemia/ASCVD Risk Reduction  Current lipid lowering medications: atorvastatin 40 mg daily  The 10-year ASCVD risk score (Arnett DK, et al., 2019) is: 22.3%   Values used to calculate the score:     Age: 60 years     Sex: Male     Is Non-Hispanic African American: Yes  Diabetic: Yes     Tobacco smoker: No     Systolic Blood Pressure: 124 mmHg     Is BP treated: Yes     HDL Cholesterol: 45 mg/dL     Total Cholesterol: 145 mg/dL   Objective:  Lab Results  Component Value Date   HGBA1C 6.7 (A) 12/28/2022    Lab Results  Component Value Date   CREATININE 1.31 (H) 12/28/2022   BUN 23 12/28/2022   NA 142 12/28/2022   K 4.3  12/28/2022   CL 103 12/28/2022   CO2 22 12/28/2022    Lab Results  Component Value Date   CHOL 145 03/08/2023   HDL 45 03/08/2023   LDLCALC 82 03/08/2023   LDLDIRECT 105 (H) 12/28/2022   TRIG 96 03/08/2023   CHOLHDL 3.2 03/08/2023    Medications Reviewed Today     Reviewed by Particia Lather, RPH (Pharmacist) on 04/20/23 at 1414  Med List Status: <None>   Medication Order Taking? Sig Documenting Provider Last Dose Status Informant  Accu-Chek Softclix Lancets lancets [id number]  Use to check blood sugar three times daily. Donell Beers, FNP  Active   albuterol (VENTOLIN HFA) 108 (90 Base) MCG/ACT inhaler [id number]  Inhale 2 puffs into the lungs every 6 (six) hours as needed for wheezing or shortness of breath. Jetty Duhamel D, MD  Active   allopurinol (ZYLOPRIM) 100 MG tablet [id number]  TAKE 1 TABLET BY MOUTH EVERY DAY [name], [name], FNP  Active   allopurinol (ZYLOPRIM) 100 MG tablet [id number] Yes Take 1 tablet (100 mg total) by mouth daily. Ivonne Andrew, NP Taking Active   apixaban (ELIQUIS) 5 MG TABS tablet [id number] Yes Take 1 tablet (5 mg total) by mouth 2 (two) times daily. Donell Beers, FNP Taking Active   atorvastatin (LIPITOR) 40 MG tablet [id number] Yes Take 1 tablet (40 mg total) by mouth daily. Donell Beers, FNP Taking Active   Blood Glucose Monitoring Suppl (BLOOD GLUCOSE MONITOR SYSTEM) w/Device KIT [id number]  Use to monitor blodd glucose 3 times daily [name], [name], FNP  Active   diclofenac Sodium (VOLTAREN) 1 % GEL [id number]  Apply 2 g topically 4 (four) times daily. Barbette Merino, NP  Active Self  empagliflozin (JARDIANCE) 25 MG TABS tablet [id number] Yes Take 1 tablet (25 mg total) by mouth daily before breakfast. Donell Beers, FNP Taking Active   gabapentin (NEURONTIN) 100 MG capsule [id number] Yes Take 1 capsule (100 mg total) by mouth daily. Donell Beers, FNP Taking Active   Glucose Blood (BLOOD GLUCOSE TEST STRIPS) STRP  [id number]  Use to check blood sugar three times daily. Donell Beers, FNP  Active   insulin glargine (LANTUS SOLOSTAR) 100 UNIT/ML Solostar Pen [id number] Yes Inject 32 Units into the skin 2 (two) times daily.  Patient taking differently: Inject 44 Units into the skin daily.   Donell Beers, FNP Taking Active            Med Note Caryn Section, Curlie Sittner P   Tue Apr 20, 2023  2:14 PM) Taking 42 units daily  Insulin Pen Needle (PEN NEEDLES) 32G X 4 MM MISC [id number]  Use to inject Lantus daily Donell Beers, FNP  Active   Lancet Device MISC [id number]  Use to check blood sugar three times daily. May substitute to any manufacturer covered by patient's insurance. Donell Beers, FNP  Active   metFORMIN (GLUCOPHAGE-XR) 500 MG 24 hr tablet [id number] Yes Take 1 tablet (500  mg total) by mouth in the morning and at bedtime. Donell Beers, FNP Taking Active            Med Note Vela Prose   Mon Apr 12, 2023  2:21 PM) Taking one tablet daily  metoprolol tartrate (LOPRESSOR) 25 MG tablet [id number]  Take 0.5 tablets (12.5 mg total) by mouth 2 (two) times daily. Donell Beers, FNP  Active   mometasone-formoterol [facility]) 100-5 MCG/ACT Sandrea Matte [id number]  Inhale 2 puffs into the lungs 2 (two) times daily. Donell Beers, FNP  Active   pantoprazole (PROTONIX) 40 MG tablet [id number]  Take 1 tablet (40 mg total) by mouth at bedtime. Donell Beers, FNP  Active   Phenylephrine-DM-GG (MUCINEX FAST-MAX CONGEST COUGH) 2.5-5-100 MG/5ML LIQD [id number]  Take 20 mLs by mouth daily as needed (cough and chest congestion). [provider]  Active Self  potassium chloride SA (KLOR-CON M20) 20 MEQ tablet [id number]  Take 1 tablet (20 mEq total) by mouth daily. Donell Beers, FNP  Active   QUEtiapine (SEROQUEL) 100 MG tablet [id number] Yes Take 1 tablet (100 mg total) by mouth 2 (two) times daily. Donell Beers, FNP Taking Active   Semaglutide,0.25 or 0.5MG /DOS, (OZEMPIC, 0.25 OR  0.5 MG/DOSE,) 2 MG/3ML SOPN [id number] Yes Inject 0.25 mg weekly for 4 weeks, then increase to 0.5 mg weekly [name], Baird Kay, FNP Taking Active            Med Note Caryn Section, Caison Hearn P   Tue Apr 20, 2023  2:14 PM) Taking 0.5 mg weekly  senna-docusate (SENOKOT-S) 8.6-50 MG tablet [id number]  Take 2 tablets by mouth at bedtime as needed for moderate constipation. Miguel Rota, MD  Active   sertraline (ZOLOFT) 100 MG tablet [id number] Yes Take 1 tablet (100 mg total) by mouth daily. Donell Beers, FNP Taking Active   torsemide (DEMADEX) 10 MG tablet [id number] Yes Take 1 tablet (10 mg total) by mouth daily. Donell Beers, FNP Taking Active              Assessment/Plan:   Diabetes: - Currently controlled based on last A1c 6.7%, below goal <7%. Patient is a great candidate for GLP-1 given BMI >50. He is tolerating Ozempic well so far and successfully titrated to 0.5 mg, though at previous appt recommended to continue 0.25 mg due to lack of BG data. Patient without s/sx of hypoglycemia - will maintain current insulin dose. Called insurance with patient on the line to attempt to resolve rejection of glucometer and BG testing supplies. Eventually was instructed that patient will need to call the local social services office to update his residency and clarify that he is no longer at a SNF. I was not able to stay on the line to call the social services office with him, but patient stated that he would go in person. I will follow-up with the patient and pharmacy in one week to confirm supplies are going through.  - Reviewed long term cardiovascular and renal outcomes of uncontrolled blood sugar - Reviewed goal A1c, goal fasting, and goal 2 hour post prandial glucose - Reviewed dietary modifications including commended him for cutting back significantly on stick buns! - Recommend to continue Ozempic 0.5 mg weekly - Recommend to continue Jardiance 25 mg daily - Recommend to continue Lantus 42 units  daily - Recommend to continue metformin XR 500 mg BID. Encouraged adherence. - Instructed patient to request refills of maintenance medications from [facility] pharmacy - Instructed patient  to visit local social service office ASAP to update residential profile so that BG testing supplies will be covered under Medicaid insurance.  - Updated A1C and UACR due at PCP follow-up - Patient denies personal or family history of multiple endocrine neoplasia type 2, medullary thyroid cancer; personal history of pancreatitis or gallbladder disease. - Recommend to check fasting blood glucose daily.    Hyperlipidemia/ASCVD Risk Reduction:  Currently uncontrolled with last LDL 82 mg/dL, above goal <91 mg/dL given dx of DM. Since then, atorvastatin has been increased from 20 mg to 40 mg. Patient confirmed he has increased dose and has been taking it.  - Reviewed long term complications of uncontrolled cholesterol - Reviewed dietary recommendations including cutting back on sticky buns - Recommend to continue atorvastatin 40 mg daily  - Updated lipid panel due at next PCP visit   Follow Up Plan: PharmD next week on 4/8/25and PCP on 05/05/23  Nils Pyle, PharmD PGY1 Pharmacy Resident

## 2023-04-21 ENCOUNTER — Other Ambulatory Visit: Payer: Self-pay

## 2023-04-27 ENCOUNTER — Other Ambulatory Visit: Payer: Self-pay

## 2023-04-27 ENCOUNTER — Other Ambulatory Visit (HOSPITAL_COMMUNITY): Payer: Self-pay

## 2023-04-27 NOTE — Progress Notes (Addendum)
 Outreached patient to confirm that he was able to get in touch with local social services office to clarify that he is no longer at a SNF, as he BG testing supplies are not covered by insurance with rejection of "institutionalized patient."   Patient reports that he did not go to the social services office because he did not have transportation. Called social services office and merged call with patient. Representative stated that patient currently has "long term care medicaid." Patient states that he has been living independently for 6 years. Representative states that Medicaid Case Worker will outreach patient in 24-48 hours to update this categorization. I asked whether this will influence his current insurance coverage, as we do not want to terminate his Medicaid coverage. Representative stated that she is not sure what the process is to update his Medicaid from a "long term care Medicaid plan," but she will highlight this question in her communication to the case worker that will outreach the patient.   The patient and myself provided contact information for continued outreach. I will check on status of testing supplies next week, and follow-up with patient for continued medication management at the end of the month. Patient states that he got refills of his medications last week after our call. Continues to tolerate Ozempic 0.5 mg well. Will plan to titrate to 1 mg after 4 weeks on 0.5 mg weekly.   Patient confirmed that he has transportation to his PCP appt at [facility] on 05/05/23.   Nils Pyle, PharmD PGY1 Pharmacy Resident

## 2023-05-04 ENCOUNTER — Other Ambulatory Visit (HOSPITAL_COMMUNITY): Payer: Self-pay

## 2023-05-05 ENCOUNTER — Ambulatory Visit: Payer: Self-pay | Admitting: Nurse Practitioner

## 2023-05-06 ENCOUNTER — Ambulatory Visit: Payer: Medicaid Other | Admitting: Internal Medicine

## 2023-05-13 ENCOUNTER — Encounter (HOSPITAL_BASED_OUTPATIENT_CLINIC_OR_DEPARTMENT_OTHER): Payer: Medicaid Other | Admitting: Internal Medicine

## 2023-05-17 ENCOUNTER — Inpatient Hospital Stay (HOSPITAL_COMMUNITY)
Admission: RE | Admit: 2023-05-17 | Discharge: 2023-05-17 | Disposition: A | Discharge status: Home / Self Care | Source: Ambulatory Visit

## 2023-05-18 ENCOUNTER — Other Ambulatory Visit: Payer: Self-pay

## 2023-05-18 ENCOUNTER — Other Ambulatory Visit (HOSPITAL_COMMUNITY): Payer: Self-pay

## 2023-05-18 DIAGNOSIS — E1142 Type 2 diabetes mellitus with diabetic polyneuropathy: Secondary | ICD-10-CM

## 2023-05-18 NOTE — Progress Notes (Signed)
 05/18/2023 Name: STYLES HENNESSEE MRN: [id number] DOB: 1960/10/19  Chief Complaint  Patient presents with   Diabetes    MAYLON HABERLE is a 63 y.o. year old male who presented for a telephone visit. PMH includes HFpEF, severe OSA, HTN, COPD, chronic respiratory failure with tracheostomy (followed at [facility] tracheostomy clinic), T2DM, BMI > 50.   They were referred to the pharmacist by their PCP for assistance in managing diabetes.   Subjective: Patient reports doing well today. At our last contact, we had a three-way call with the social services office who advised that a Medicaid social worker would outreach the patient in 24-48 hours. This has not occurred. Will refer to SW for continued assistance in contacting Medicaid.  Patient expresses interest in rescheduling PCP appt that he missed two weeks ago:  Care Team: Primary Care Provider: [name], [name], FNP ; Next Scheduled Visit: 06/23/23 Pulmonology: [name], MD: Next Scheduled Visit: 06/29/23  Medication Access/Adherence  Current Pharmacy:  Maryan Sm[facility] - [facility] [address] [place] [address] Phone: [phone] Fax: [phone]  [id number] - [place], [place] - [address] AT [facility] [address] [place] [address] Phone: [phone] Fax: [phone]   Patient reports affordability concerns with their medications: No  Patient reports access/transportation concerns to their pharmacy: No  Patient reports adherence concerns with their medications:  No  - Reports it is time for a refill on his Ozempic    Diabetes:  Current medications: Ozempic  0.5 mg weekly (Saturdays - thinks he has ~1/2 pen left), Jardiance  25 mg daily, metformin  XR 500 mg BID (taking once daily), Lantus  40 units daily (self-decreased Lantus  since increasing to Ozempic  0.5 mg)  He denies GI AE on current dose of Ozempic . Continues to report appetite  suppression. His snacking has decreased.  He has been stopping eating when he feels full.   He reports that he is not interested in CGM at this time. He has a flip phone, and does not want to use a reader to check the values.   Patient denies hypoglycemic s/sx including dizziness, shakiness. He reports he has had some sweating and increased thirst recently, but believes it is due to the heat. Patient denies hyperglycemic symptoms including polyuria, polydipsia, polyphagia, nocturia, neuropathy, blurred vision. He does report that he sees some black spots in his vision, but this is usual for him - he is due for ophthalmology appt.   Current meal patterns: 2 meals/day - Breakfast: sausage, eggs - Supper: [place] burger, salad - Snacks: previously eating 2 sticky buns/day but says he has cut back - had one sticky bun last week. - Drinks: water , fruit juice once a day, coffee with cream and splenda  Hyperlipidemia/ASCVD Risk Reduction  Current lipid lowering medications: atorvastatin  40 mg daily  The 10-year ASCVD risk score (Arnett DK, et al., 2019) is: 22.3%   Values used to calculate the score:     Age: 44 years     Sex: Male     Is Non-Hispanic African American: Yes     Diabetic: Yes     Tobacco smoker: No     Systolic Blood Pressure: 124 mmHg     Is BP treated: Yes     HDL Cholesterol: 45 mg/dL     Total Cholesterol: 145 mg/dL   Objective:  BP Readings from Last 3 Encounters:  03/09/23 124/82  12/28/22 125/62  09/18/22 116/60    Lab  Results  Component Value Date   HGBA1C 6.7 (A) 12/28/2022    Lab Results  Component Value Date   CREATININE 1.31 (H) 12/28/2022   BUN 23 12/28/2022   NA 142 12/28/2022   K 4.3 12/28/2022   CL 103 12/28/2022   CO2 22 12/28/2022    Lab Results  Component Value Date   CHOL 145 03/08/2023   HDL 45 03/08/2023   LDLCALC 82 03/08/2023   LDLDIRECT 105 (H) 12/28/2022   TRIG 96 03/08/2023   CHOLHDL 3.2 03/08/2023    Medications Reviewed  Today     Reviewed by Adra Alanis, RPH (Pharmacist) on 05/18/23 at 1229  Med List Status: <None>   Medication Order Taking? Sig Documenting Provider Last Dose Status Informant  Accu-Chek Softclix Lancets lancets [id number]  Use to check blood sugar three times daily. [name], [name], FNP  Active   albuterol  (VENTOLIN  HFA) 108 (90 Base) MCG/ACT inhaler [id number]  Inhale 2 puffs into the lungs every 6 (six) hours as needed for wheezing or shortness of breath. [name] D, MD  Active   allopurinol  (ZYLOPRIM ) 100 MG tablet [id number]  TAKE 1 TABLET BY MOUTH EVERY DAY [name], [name], FNP  Active   allopurinol  (ZYLOPRIM ) 100 MG tablet [id number]  Take 1 tablet (100 mg total) by mouth daily. Jerrlyn Morel, NP  Active   apixaban  (ELIQUIS ) 5 MG TABS tablet [id number] Yes Take 1 tablet (5 mg total) by mouth 2 (two) times daily. [name], [name], FNP Taking Active   atorvastatin  (LIPITOR) 40 MG tablet [id number] Yes Take 1 tablet (40 mg total) by mouth daily. [name], [name], FNP Taking Active   Blood Glucose Monitoring Suppl (BLOOD GLUCOSE MONITOR SYSTEM) w/Device KIT [id number]  Use to monitor blodd glucose 3 times daily [name], [name], FNP  Active   diclofenac  Sodium (VOLTAREN ) 1 % GEL [id number]  Apply 2 g topically 4 (four) times daily. Gregoria Leas, NP  Active Self  empagliflozin  (JARDIANCE ) 25 MG TABS tablet [id number] Yes Take 1 tablet (25 mg total) by mouth daily before breakfast. [name], [name], FNP Taking Active   gabapentin  (NEURONTIN ) 100 MG capsule [id number] Yes Take 1 capsule (100 mg total) by mouth daily. [name], [name], FNP Taking Active   Glucose Blood (BLOOD GLUCOSE TEST STRIPS) STRP [id number]  Use to check blood sugar three times daily. [name], [name], FNP  Active   insulin  glargine (LANTUS  SOLOSTAR) 100 UNIT/ML Solostar Pen [id number] Yes Inject 42 Units into the skin daily. [name], [name], FNP Taking Active            Med Note Francetta Innocent, Dejon Lukas P    Tue May 18, 2023 12:29 PM) Taking 40 units daily  Insulin  Pen Needle (PEN NEEDLES) 32G X 4 MM MISC [id number]  Use to inject Lantus  daily [name], [name], FNP  Active   Lancet Device MISC [id number]  Use to check blood sugar three times daily. May substitute to any manufacturer covered by patient's insurance. [name], [name], FNP  Active   metFORMIN  (GLUCOPHAGE -XR) 500 MG 24 hr tablet [id number] Yes Take 1 tablet (500 mg total) by mouth in the morning and at bedtime. [name], [name], FNP Taking Active            Med Note Hoyt Macleod Apr 12, 2023  2:21 PM) Taking one tablet daily  metoprolol  tartrate (LOPRESSOR ) 25 MG tablet [id number] Yes Take 0.5 tablets (12.5 mg total) by mouth 2 (two)  times daily. [name], [name], FNP Taking Active   mometasone -formoterol  (DULERA ) 100-5 MCG/ACT AERO [id number]  Inhale 2 puffs into the lungs 2 (two) times daily. [name], [name], FNP  Active   pantoprazole  (PROTONIX ) 40 MG tablet [id number] Yes Take 1 tablet (40 mg total) by mouth at bedtime. [name], [name], FNP Taking Active   Phenylephrine -DM-GG (MUCINEX  FAST-MAX CONGEST COUGH) 2.5-5-100 MG/5ML LIQD [id number]  Take 20 mLs by mouth daily as needed (cough and chest congestion). [provider]  Active Self  potassium chloride  SA (KLOR-CON  M20) 20 MEQ tablet [id number]  Take 1 tablet (20 mEq total) by mouth daily. [name], [name], FNP  Active   QUEtiapine  (SEROQUEL ) 100 MG tablet [id number] Yes Take 1 tablet (100 mg total) by mouth 2 (two) times daily. [name], [name], FNP Taking Active   Semaglutide ,0.25 or 0.5MG /DOS, (OZEMPIC , 0.25 OR 0.5 MG/DOSE,) 2 MG/3ML SOPN [id number] Yes Inject 0.5 mg into the skin once a week. [name], [name], FNP Taking Active   senna-docusate (SENOKOT-S) 8.6-50 MG tablet [id number]  Take 2 tablets by mouth at bedtime as needed for moderate constipation. Maggie Schooner, MD  Active   sertraline  (ZOLOFT ) 100 MG tablet [id number] Yes Take 1 tablet  (100 mg total) by mouth daily. [name], [name], FNP Taking Active   torsemide  (DEMADEX ) 10 MG tablet [id number] Yes Take 1 tablet (10 mg total) by mouth daily. [name], [name], FNP Taking Active              Assessment/Plan:   Diabetes: - Currently controlled based on last A1c 6.7%, below goal <7%. Patient is a great candidate for GLP-1 given BMI >50. He is tolerating Ozempic  0.5 mg well so far. Reports that he would prefer to stay on this dose rather than increasing to 1 mg at this time. Feel this is appropriate, as I would like to see a repeat A1C prior to further titration to determine whether reduction in insulin  is needed. Patient without s/sx of hypoglycemia but is aware of management plan.   - Reviewed long term cardiovascular and renal outcomes of uncontrolled blood sugar - Reviewed goal A1c, goal fasting, and goal 2 hour post prandial glucose - Reviewed dietary modifications including commended him for cutting back significantly on stick buns! - Recommend to continue Ozempic  0.5 mg weekly - facilitated refill for 2 mo supply of Ozempic  via [facility] mail order pharmacy today - Recommend to continue Jardiance  25 mg daily - Recommend to continue Lantus  40 units daily. If next A1C is <6.5%, consider 20% dose reduction to 32 units daily. - Recommend to continue metformin  XR 500 mg BID. Encouraged adherence. - Placed referral to [facility]BC[facility] Social Work to help with continued communication with Medicaid office. At previous appointment, social services office removed "long term care" designation from his profile, but representative was uncertain how this would affect his Medicaid plan. Would like for someone to follow-up with a three way call between the patient and the Medicaid office again to ensure he will have continued prescription coverage.  - Due for  A1C and UACR due at PCP follow-up - Patient denies personal or family history of multiple endocrine neoplasia type 2, medullary  thyroid  cancer; personal history of pancreatitis or gallbladder disease. - Recommend to check fasting blood glucose daily, and will continue to work towards obtaining covered BG supplies once issues with Medicaid coverage is resolved as testing supplies are not covered under his current "Long Term Care Medicaid Plan".    Hyperlipidemia/ASCVD  Risk Reduction:  Currently uncontrolled with last LDL 82 mg/dL, above goal <16 mg/dL given dx of DM. Since then, atorvastatin  has been increased from 20 mg to 40 mg. Patient confirmed he has increased dose and has been taking it.  - Reviewed long term complications of uncontrolled cholesterol - Reviewed dietary recommendations including cutting back saturated fats and excess carbohydrates - Recommend to continue atorvastatin  40 mg daily  - Updated lipid panel due at next PCP visit   Follow Up Plan: PharmD telephone 06/06/23 and PCP 06/23/23  Arthea Larsson, PharmD PGY1 Pharmacy Resident

## 2023-05-19 ENCOUNTER — Telehealth: Payer: Self-pay | Admitting: *Deleted

## 2023-05-19 NOTE — Progress Notes (Signed)
 Complex Care Management Note Care Guide Note  05/19/2023 Name: Timothy Le MRN: [id number] DOB: [birth year]   Complex Care Management Outreach Attempts: An unsuccessful telephone outreach was attempted today to offer the patient information about available complex care management services.  Follow Up Plan:  Additional outreach attempts will be made to offer the patient complex care management information and services.   Encounter Outcome:  No Answer  Barnie Bora  [facility]  [facility], [facility] Guide  Direct Dial: [phone]  Fax [phone]

## 2023-05-20 NOTE — Progress Notes (Signed)
 Complex Care Management Note  Care Guide Note 05/20/2023 Name: ISTVAN WACHTEL MRN: [id number] DOB: 13-Jul-1960  TRUEN SCHAUL is a 63 y.o. year old male who sees [name], [name], FNP for primary care. I reached out to Cheryle Corral by phone today to offer complex care management services.  Mr. Mulla was given information about Complex Care Management services today including:   The Complex Care Management services include support from the care team which includes your Nurse Care Manager, Clinical Social Worker, or Pharmacist.  The Complex Care Management team is here to help remove barriers to the health concerns and goals most important to you. Complex Care Management services are voluntary, and the patient may decline or stop services at any time by request to their care team member.   Complex Care Management Consent Status: Patient agreed to services and verbal consent obtained.   Follow up plan:  Telephone appointment with complex care management team member scheduled for:  5/13  Encounter Outcome:  Patient Scheduled  Barnie Bora  [facility]  [facility], [facility] Guide  Direct Dial: [phone]  Fax [phone]\

## 2023-06-01 ENCOUNTER — Other Ambulatory Visit: Payer: Self-pay | Admitting: Licensed Clinical Social Worker

## 2023-06-01 NOTE — Patient Instructions (Signed)
 Visit Information  Thank you for taking time to visit with me today. Please don't hesitate to contact me if I can be of assistance to you before our next scheduled appointment.  Our next appointment is by telephone on 06/06 at 11 AM Please call the care guide team at [phone] if you need to cancel or reschedule your appointment.   Following is a copy of your care plan:   Goals Addressed             This Visit's Progress    LCSW [facility]BC[facility] Social Work Care Plan   On track    Problems:   Needing to re-apply for Medicaid to cover supplies/medications  CSW Clinical Goal(s):   Over the next 60 days the Patient will attend all scheduled medical appointments as evidenced by patient report and care team review of appointment completion in electronic MEDICAL RECORD NUMBER  work with Child psychotherapist to address concerns related to re-applying for Medicaid.  Interventions:  Mental Health:  Evaluation of current treatment plan related to re-applying for Medicaid to cover supplies/medications Active listening / Reflection utilized Emotional Support Provided Problem Solving /Task Center strategies reviewed Reviewed mental health medications and discussed importance of compliance: Patient is taking meds as prescribed  Patient Goals/Self-Care Activities:  Increase coping skills and healthy habits  Patient visited DSS office on 5/12 to re-apply for Medicaid, re-certify for SNAP, apply for furniture assistance  Plan:   Telephone follow up appointment with care management team member scheduled for:  4 weeks        Please call the Suicide and Crisis Lifeline: 988 go to [facility] [address] [place] [phone]) call 911 if you are experiencing a Mental Health or Behavioral Health Crisis or need someone to talk to.  The patient verbalized understanding of instructions, educational materials, and care plan provided today and DECLINED offer to receive  copy of patient instructions, educational materials, and care plan.   Arlis Bent [facility]  [facility], [facility] Clinical Social Worker Direct Dial: [phone]  Fax: [phone] Website: [email or web address] 11:06 AM

## 2023-06-01 NOTE — Patient Outreach (Signed)
 Complex Care Management   Visit Note  06/01/2023  Name:  Timothy Le MRN: [id number] DOB: 07-Aug-1960  Situation: Referral received for Complex Care Management related to applying for Medicaid I obtained verbal consent from Patient.  Visit completed with pt  on the phone  Background:   Past Medical History:  Diagnosis Date   Asthma    Chronic respiratory failure with hypoxia (HCC) 07/09/2017   COPD (chronic obstructive pulmonary disease) (HCC)    Diabetes mellitus without complication (HCC)    Difficult intubation    Hypertension    Severe obesity (BMI >= 40) (HCC)    Shortness of breath dyspnea    Sleep apnea    Tracheobronchitis     Assessment: Patient Reported Symptoms:  Cognitive Cognitive Status: Alert and oriented to person, place, and time, Normal speech and language skills      Neurological Neurological Review of Symptoms: No symptoms reported    HEENT HEENT Symptoms Reported: No symptoms reported      Cardiovascular Cardiovascular Symptoms Reported: No symptoms reported Does patient have uncontrolled Hypertension?: No Cardiovascular Conditions: Heart failure, Hypertension Cardiovascular Management Strategies: Diet modification, Medication therapy, Routine screening  Respiratory Respiratory Symptoms Reported: No symptoms reported Respiratory Conditions: COPD, Tracheostomy  Endocrine Patient reports the following symptoms related to hypoglycemia or hyperglycemia : No symptoms reported Is patient diabetic?: Yes Is patient checking blood sugars at home?: No Endocrine Conditions: Diabetes Endocrine Management Strategies: Routine screening, Weight management, Medication therapy  Gastrointestinal Gastrointestinal Symptoms Reported: No symptoms reported      Genitourinary Genitourinary Symptoms Reported: No symptoms reported    Integumentary Integumentary Symptoms Reported: No symptoms reported    Musculoskeletal Musculoskelatal Symptoms Reviewed: Unsteady  gait Additional Musculoskeletal Details: Pt reports his left leg will "give out sometimes"   Falls in the past year?: Yes Number of falls in past year: 2 or more Was there an injury with Fall?: No Fall Risk Category Calculator: 2 Patient Fall Risk Level: Moderate Fall Risk Patient at Risk for Falls Due to: Other (Comment) (Patient reports he fell due to his previous apartment not being appropriate) Fall risk Follow up: Falls prevention discussed  Psychosocial Psychosocial Symptoms Reported: No symptoms reported Behavioral Management Strategies: Adequate rest, Coping strategies, Support system Behavioral Health Self-Management Outcome: 4 (good) Major Change/Loss/Stressor/Fears (CP): Medical condition, self Techniques to Cope with Loss/Stress/Change: Diversional activities, Spiritual practice(s) Quality of Family Relationships: helpful, involved, supportive Do you feel physically threatened by others?: No      12/28/2022   11:31 AM  Depression screen PHQ 2/9  Decreased Interest 0  Down, Depressed, Hopeless 0  PHQ - 2 Score 0  Altered sleeping 0  Tired, decreased energy 1  Change in appetite 0  Feeling bad or failure about yourself  0  Trouble concentrating 3  Moving slowly or fidgety/restless 2  Suicidal thoughts 0  PHQ-9 Score 6  Difficult doing work/chores Not difficult at all    There were no vitals filed for this visit.  Medications Reviewed Today   Medications were not reviewed in this encounter     Recommendation:   Continue utilizing strategies discussed to assist with symptom management  Follow Up Plan:   Telephone follow-up in 1 month  Alease Hunter, LCSW [facility]  [facility], [facility] Clinical Social Worker Direct Dial: [phone]  Fax: [phone] Website: [email or web address] 11:05 AM

## 2023-06-06 ENCOUNTER — Other Ambulatory Visit (HOSPITAL_COMMUNITY): Payer: Self-pay

## 2023-06-06 ENCOUNTER — Other Ambulatory Visit: Payer: Self-pay

## 2023-06-06 NOTE — Progress Notes (Deleted)
 Timothy Le

## 2023-06-11 ENCOUNTER — Other Ambulatory Visit (HOSPITAL_COMMUNITY): Payer: Self-pay

## 2023-06-11 ENCOUNTER — Other Ambulatory Visit: Payer: Self-pay

## 2023-06-11 ENCOUNTER — Other Ambulatory Visit: Payer: Self-pay | Admitting: Nurse Practitioner

## 2023-06-11 DIAGNOSIS — F418 Other specified anxiety disorders: Secondary | ICD-10-CM

## 2023-06-11 MED ORDER — SERTRALINE HCL 100 MG PO TABS
100.0000 mg | ORAL_TABLET | Freq: Every day | ORAL | 0 refills | Status: DC
Start: 2023-06-11 — End: 2023-09-13
  Filled 2023-06-11: qty 90, 90d supply, fill #0

## 2023-06-12 ENCOUNTER — Other Ambulatory Visit (HOSPITAL_COMMUNITY): Payer: Self-pay

## 2023-06-21 ENCOUNTER — Other Ambulatory Visit (HOSPITAL_COMMUNITY): Payer: Self-pay

## 2023-06-21 ENCOUNTER — Other Ambulatory Visit: Payer: Self-pay

## 2023-06-21 NOTE — Progress Notes (Signed)
Attempted to contact patient for scheduled appointment for medication management. Left HIPAA compliant message for patient to return my call at their convenience.    Nils Pyle, PharmD PGY1 Pharmacy Resident

## 2023-06-23 ENCOUNTER — Ambulatory Visit (INDEPENDENT_AMBULATORY_CARE_PROVIDER_SITE_OTHER): Payer: Self-pay | Admitting: Nurse Practitioner

## 2023-06-23 ENCOUNTER — Other Ambulatory Visit (HOSPITAL_COMMUNITY): Payer: Self-pay

## 2023-06-23 ENCOUNTER — Encounter: Payer: Self-pay | Admitting: Nurse Practitioner

## 2023-06-23 VITALS — BP 134/63 | HR 53 | Temp 97.0°F | Wt 336.0 lb

## 2023-06-23 DIAGNOSIS — Z794 Long term (current) use of insulin: Secondary | ICD-10-CM | POA: Diagnosis not present

## 2023-06-23 DIAGNOSIS — E785 Hyperlipidemia, unspecified: Secondary | ICD-10-CM | POA: Diagnosis not present

## 2023-06-23 DIAGNOSIS — M109 Gout, unspecified: Secondary | ICD-10-CM

## 2023-06-23 DIAGNOSIS — E1100 Type 2 diabetes mellitus with hyperosmolarity without nonketotic hyperglycemic-hyperosmolar coma (NKHHC): Secondary | ICD-10-CM | POA: Diagnosis not present

## 2023-06-23 DIAGNOSIS — G8929 Other chronic pain: Secondary | ICD-10-CM | POA: Diagnosis not present

## 2023-06-23 DIAGNOSIS — I10 Essential (primary) hypertension: Secondary | ICD-10-CM | POA: Diagnosis not present

## 2023-06-23 DIAGNOSIS — M25562 Pain in left knee: Secondary | ICD-10-CM | POA: Diagnosis not present

## 2023-06-23 DIAGNOSIS — J449 Chronic obstructive pulmonary disease, unspecified: Secondary | ICD-10-CM

## 2023-06-23 DIAGNOSIS — I5032 Chronic diastolic (congestive) heart failure: Secondary | ICD-10-CM

## 2023-06-23 DIAGNOSIS — E1142 Type 2 diabetes mellitus with diabetic polyneuropathy: Secondary | ICD-10-CM

## 2023-06-23 LAB — POCT GLYCOSYLATED HEMOGLOBIN (HGB A1C): Hemoglobin A1C: 6.1 % — AB (ref 4.0–5.6)

## 2023-06-23 MED ORDER — ALLOPURINOL 100 MG PO TABS
100.0000 mg | ORAL_TABLET | Freq: Every day | ORAL | 0 refills | Status: DC
  Filled 2023-06-23: qty 90, 90d supply, fill #0

## 2023-06-23 MED ORDER — DICLOFENAC SODIUM 1 % EX GEL
2.0000 g | Freq: Four times a day (QID) | CUTANEOUS | 5 refills | Status: DC
  Filled 2023-06-23: qty 200, 25d supply, fill #0

## 2023-06-23 MED ORDER — POTASSIUM CHLORIDE CRYS ER 20 MEQ PO TBCR
20.0000 meq | EXTENDED_RELEASE_TABLET | Freq: Every day | ORAL | 1 refills | Status: DC
  Filled 2023-06-23: qty 90, 90d supply, fill #0

## 2023-06-23 MED ORDER — BLOOD GLUCOSE MONITOR SYSTEM W/DEVICE KIT
1.0000 | PACK | Freq: Three times a day (TID) | 0 refills | Status: DC
  Filled 2023-06-23: qty 1, 90d supply, fill #0
  Filled 2023-07-07: qty 1, 30d supply, fill #0

## 2023-06-23 MED ORDER — EMPAGLIFLOZIN 25 MG PO TABS
25.0000 mg | ORAL_TABLET | Freq: Every day | ORAL | 3 refills | Status: AC
  Filled 2023-06-23 – 2023-07-08 (×3): qty 90, 90d supply, fill #0
  Filled 2023-10-08: qty 90, 90d supply, fill #1
  Filled 2024-01-07: qty 90, 90d supply, fill #2

## 2023-06-23 MED ORDER — LANCETS MISC. MISC
1.0000 | Freq: Three times a day (TID) | 0 refills | Status: AC
  Filled 2023-06-23: qty 100, 30d supply, fill #0

## 2023-06-23 MED ORDER — LANCET DEVICE MISC
1.0000 | Freq: Three times a day (TID) | 0 refills | Status: AC
  Filled 2023-06-23: qty 1, 30d supply, fill #0

## 2023-06-23 MED ORDER — BLOOD GLUCOSE TEST VI STRP
1.0000 | ORAL_STRIP | Freq: Three times a day (TID) | 0 refills | Status: AC
  Filled 2023-06-23 – 2023-07-07 (×2): qty 100, 34d supply, fill #0

## 2023-06-23 MED ORDER — LANTUS SOLOSTAR 100 UNIT/ML ~~LOC~~ SOPN
PEN_INJECTOR | SUBCUTANEOUS | 3 refills | Status: DC
  Filled 2023-06-23: qty 15, fill #0

## 2023-06-23 NOTE — Patient Instructions (Addendum)
 Eye doctor   [address] [place] [address]  P:  [phone] F:  [phone]   Please consider getting Shingrix , pneumococcal and Tdap vaccine at local pharmacy.     It is important that you exercise regularly at least 30 minutes 5 times a week as tolerated  Think about what you will eat, plan ahead. Choose " clean, green, fresh or frozen" over canned, processed or packaged foods which are more sugary, salty and fatty. 70 to 75% of food eaten should be vegetables and fruit. Three meals at set times with snacks allowed between meals, but they must be fruit or vegetables. Aim to eat over a 12 hour period , example 7 am to 7 pm, and STOP after  your last meal of the day. Drink water ,generally about 64 ounces per day, no other drink is as healthy. Fruit juice is best enjoyed in a healthy way, by EATING the fruit.  Thanks for choosing [facility] we consider it a privelige to serve you.

## 2023-06-23 NOTE — Progress Notes (Signed)
 Established Patient Office Visit  Subjective:  Patient ID: Timothy Le, male    DOB: May 13, 1960  Age: 63 y.o. MRN: [id number]  CC: No chief complaint on file.   HPI Timothy Le is a 63 y.o. male  has a past medical history of Asthma, Chronic respiratory failure with hypoxia (HCC) (07/09/2017), COPD (chronic obstructive pulmonary disease) (HCC), Diabetes mellitus without complication (HCC), Difficult intubation, Hypertension, Severe obesity (BMI >= 40) (HCC), Shortness of breath dyspnea, Sleep apnea, and Tracheobronchitis.   Patient presents for follow-up for his chronic medical conditions He denies any adverse reactions to current medications, stated that he is doing well generally     Past Medical History:  Diagnosis Date   Asthma    Chronic respiratory failure with hypoxia (HCC) 07/09/2017   COPD (chronic obstructive pulmonary disease) (HCC)    Diabetes mellitus without complication (HCC)    Difficult intubation    Hypertension    Severe obesity (BMI >= 40) (HCC)    Shortness of breath dyspnea    Sleep apnea    Tracheobronchitis     Past Surgical History:  Procedure Laterality Date   CORONARY ULTRASOUND/IVUS Left 12/11/2019   Procedure: Intravascular Ultrasound/IVUS;  Surgeon: Adine Hoof, MD;  Location: [facility] INVASIVE CV LAB;  Service: Cardiovascular;  Laterality: Left;  Left lower extremity venous   IR GASTROSTOMY TUBE MOD SED  07/05/2017   IVC VENOGRAPHY N/A 01/10/2021   Procedure: IVC VENOGRAPHY - VENA CAVA;  Surgeon: Dannis Dy, MD;  Location: [facility] INVASIVE CV LAB;  Service: Cardiovascular;  Laterality: N/A;   LOWER EXTREMITY VENOGRAPHY N/A 01/10/2021   Procedure: LOWER EXTREMITY VENOGRAPHY;  Surgeon: Dannis Dy, MD;  Location: [facility] INVASIVE CV LAB;  Service: Cardiovascular;  Laterality: N/A;   PERIPHERAL VASCULAR THROMBECTOMY Left 12/11/2019   Procedure: PERIPHERAL VASCULAR THROMBECTOMY;  Surgeon: Adine Hoof,  MD;  Location: [facility] INVASIVE CV LAB;  Service: Cardiovascular;  Laterality: Left;   THYROIDECTOMY Left 08/30/2019   Procedure: THYROIDECTOMY CERVICAL LIPECTOMY;  Surgeon: Janita Mellow, MD;  Location: [facility] OR;  Service: ENT;  Laterality: Left;  NEEDS RNFA PLEASE   TRACHEOSTOMY TUBE PLACEMENT N/A 06/17/2017   Procedure: TRACHEOSTOMY;  Surgeon: Reynold Caves, MD;  Location: [facility] OR;  Service: ENT;  Laterality: N/A;   TRACHEOSTOMY TUBE PLACEMENT N/A 08/14/2019   Procedure: TRACHEOSTOMY;  Surgeon: Janita Mellow, MD;  Location: [facility] OR;  Service: ENT;  Laterality: N/A;   TRACHEOSTOMY TUBE PLACEMENT N/A 08/30/2019   Procedure: TRACHEOSTOMY  REVISION;  Surgeon: Janita Mellow, MD;  Location: [facility] OR;  Service: ENT;  Laterality: N/A;    Family History  Problem Relation Age of Onset   Asthma Sister    Asthma Other        nephew   Hypertension Sister    Diabetes type II Sister     Social History   Socioeconomic History   Marital status: Legally Separated    Spouse name: Not on file   Number of children: 1   Years of education: Not on file   Highest education level: Not on file  Occupational History   Occupation: unemployed    Comment: working on disablity  Tobacco Use   Smoking status: Former    Current packs/day: 0.00    Average packs/day: 0.5 packs/day for 29.6 years (14.8 ttl pk-yrs)    Types: Cigarettes    Start date: 07/28/1983    Quit date: 02/27/2013    Years since quitting: 10.3   Smokeless tobacco: Never  Vaping Use   Vaping status: Never Used  Substance and Sexual Activity   Alcohol use: No    Comment: quit ETOH about 5 months ago-beer and liquor   Drug use: No    Comment: quit 6 months-"pot"-smoked couple joints a week   Sexual activity: Not Currently  Other Topics Concern   Not on file  Social History Narrative   Lives home alone   Social Drivers of Health   Financial Resource Strain: Not on file  Food Insecurity: No Food Insecurity (06/01/2023)   Hunger Vital Sign    Worried About  Running Out of Food in the Last Year: Never true    Ran Out of Food in the Last Year: Never true  Transportation Needs: No Transportation Needs (06/01/2023)   PRAPARE - Administrator, Civil Service (Medical): No    Lack of Transportation (Non-Medical): No  Physical Activity: Not on file  Stress: Not on file  Social Connections: Not on file  Intimate Partner Violence: Not At Risk (06/01/2023)   Humiliation, Afraid, Rape, and Kick questionnaire    Fear of Current or Ex-Partner: No    Emotionally Abused: No    Physically Abused: No    Sexually Abused: No    Outpatient Medications Prior to Visit  Medication Sig Dispense Refill   albuterol  (VENTOLIN  HFA) 108 (90 Base) MCG/ACT inhaler Inhale 2 puffs into the lungs every 6 (six) hours as needed for wheezing or shortness of breath. 18 g 12   allopurinol  (ZYLOPRIM ) 100 MG tablet Take 1 tablet (100 mg total) by mouth daily. 90 tablet 0   apixaban  (ELIQUIS ) 5 MG TABS tablet Take 1 tablet (5 mg total) by mouth 2 (two) times daily. 180 tablet 1   atorvastatin  (LIPITOR) 40 MG tablet Take 1 tablet (40 mg total) by mouth daily. 90 tablet 3   gabapentin  (NEURONTIN ) 100 MG capsule Take 1 capsule (100 mg total) by mouth daily. 540 capsule 0   Insulin  Pen Needle (PEN NEEDLES) 32G X 4 MM MISC Use to inject Lantus  daily 100 each 3   metFORMIN  (GLUCOPHAGE -XR) 500 MG 24 hr tablet Take 1 tablet (500 mg total) by mouth in the morning and at bedtime. 180 tablet 3   metoprolol  tartrate (LOPRESSOR ) 25 MG tablet Take 0.5 tablets (12.5 mg total) by mouth 2 (two) times daily. 90 tablet 1   mometasone -formoterol  (DULERA ) 100-5 MCG/ACT AERO Inhale 2 puffs into the lungs 2 (two) times daily. 13 g 2   pantoprazole  (PROTONIX ) 40 MG tablet Take 1 tablet (40 mg total) by mouth at bedtime. 90 tablet 1   Phenylephrine -DM-GG (MUCINEX  FAST-MAX CONGEST COUGH) 2.5-5-100 MG/5ML LIQD Take 20 mLs by mouth daily as needed (cough and chest congestion).     QUEtiapine   (SEROQUEL ) 100 MG tablet Take 1 tablet (100 mg total) by mouth 2 (two) times daily. 180 tablet 1   Semaglutide ,0.25 or 0.5MG /DOS, (OZEMPIC , 0.25 OR 0.5 MG/DOSE,) 2 MG/3ML SOPN Inject 0.5 mg into the skin once a week. 3 mL 2   sertraline  (ZOLOFT ) 100 MG tablet Take 1 tablet (100 mg total) by mouth daily. 90 tablet 0   torsemide  (DEMADEX ) 10 MG tablet Take 1 tablet (10 mg total) by mouth daily. 90 tablet 1   diclofenac  Sodium (VOLTAREN ) 1 % GEL Apply 2 g topically 4 (four) times daily. 150 g 5   empagliflozin  (JARDIANCE ) 25 MG TABS tablet Take 1 tablet (25 mg total) by mouth daily before breakfast. 90 tablet 3   insulin   glargine (LANTUS  SOLOSTAR) 100 UNIT/ML Solostar Pen Inject 42 Units into the skin daily. 15 mL 3   potassium chloride  SA (KLOR-CON  M20) 20 MEQ tablet Take 1 tablet (20 mEq total) by mouth daily. 90 tablet 1   Accu-Chek Softclix Lancets lancets Use to check blood sugar three times daily. (Patient not taking: Reported on 06/23/2023) 100 each 3   senna-docusate (SENOKOT-S) 8.6-50 MG tablet Take 2 tablets by mouth at bedtime as needed for moderate constipation. (Patient not taking: Reported on 06/23/2023) 60 tablet 0   allopurinol  (ZYLOPRIM ) 100 MG tablet TAKE 1 TABLET BY MOUTH EVERY DAY (Patient not taking: Reported on 06/23/2023) 90 tablet 0   Blood Glucose Monitoring Suppl (BLOOD GLUCOSE MONITOR SYSTEM) w/Device KIT Use to monitor blodd glucose 3 times daily 1 kit 0   Glucose Blood (BLOOD GLUCOSE TEST STRIPS) STRP Use to check blood sugar three times daily. 100 strip 3   Lancet Device MISC Use to check blood sugar three times daily. May substitute to any manufacturer covered by patient's insurance. 1 each 0   No facility-administered medications prior to visit.    No Known Allergies  ROS Review of Systems  Constitutional:  Negative for appetite change, chills, fatigue and fever.  HENT:  Negative for congestion, postnasal drip, rhinorrhea and sneezing.   Respiratory:  Negative for  cough, shortness of breath and wheezing.   Cardiovascular:  Negative for chest pain, palpitations and leg swelling.  Gastrointestinal:  Negative for abdominal pain, constipation, nausea and vomiting.  Genitourinary:  Negative for difficulty urinating, dysuria, flank pain and frequency.  Musculoskeletal:  Negative for arthralgias, back pain, joint swelling and myalgias.  Skin:  Negative for color change, pallor, rash and wound.  Neurological:  Negative for dizziness, facial asymmetry, weakness, numbness and headaches.  Psychiatric/Behavioral:  Negative for behavioral problems, confusion, self-injury and suicidal ideas.       Objective:     Physical Exam Vitals and nursing note reviewed.  Constitutional:      General: He is not in acute distress.    Appearance: Normal appearance. He is obese. He is not ill-appearing, toxic-appearing or diaphoretic.  Eyes:     General: No scleral icterus.       Right eye: No discharge.        Left eye: No discharge.     Extraocular Movements: Extraocular movements intact.     Conjunctiva/sclera: Conjunctivae normal.  Cardiovascular:     Rate and Rhythm: Normal rate and regular rhythm.     Pulses: Normal pulses.     Heart sounds: Normal heart sounds. No murmur heard.    No friction rub. No gallop.  Pulmonary:     Effort: Pulmonary effort is normal. No respiratory distress.     Breath sounds: Normal breath sounds. No stridor. No wheezing, rhonchi or rales.     Comments: Trach in place Chest:     Chest wall: No tenderness.  Abdominal:     General: There is no distension.     Palpations: Abdomen is soft.     Tenderness: There is no abdominal tenderness. There is no right CVA tenderness, left CVA tenderness or guarding.  Musculoskeletal:        General: No swelling, tenderness, deformity or signs of injury.     Right lower leg: No edema.     Left lower leg: No edema.  Skin:    General: Skin is warm and dry.     Capillary Refill: Capillary refill  takes 2 to 3  seconds.     Coloration: Skin is not jaundiced or pale.     Findings: No bruising, erythema or lesion.  Neurological:     Mental Status: He is alert and oriented to person, place, and time.     Motor: No weakness.     Gait: Gait abnormal.     Comments: Uses a cane  Psychiatric:        Mood and Affect: Mood normal.        Behavior: Behavior normal.        Thought Content: Thought content normal.        Judgment: Judgment normal.     BP 134/63   Pulse (!) 53   Temp (!) 97 F (36.1 C)   Wt (!) 336 lb (152.4 kg)   SpO2 97%   BMI 51.09 kg/m  Wt Readings from Last 3 Encounters:  06/23/23 (!) 336 lb (152.4 kg)  03/31/23 (!) 339 lb (153.8 kg)  03/09/23 (!) 339 lb 9.6 oz (154 kg)    Lab Results  Component Value Date   TSH 1.68 09/09/2021   Lab Results  Component Value Date   WBC 5.4 09/07/2022   HGB 13.8 09/07/2022   HCT 45.1 09/07/2022   MCV 89.3 09/07/2022   PLT 254 09/07/2022   Lab Results  Component Value Date   NA 142 12/28/2022   K 4.3 12/28/2022   CO2 22 12/28/2022   GLUCOSE 150 (H) 12/28/2022   BUN 23 12/28/2022   CREATININE 1.31 (H) 12/28/2022   BILITOT 0.8 09/07/2022   ALKPHOS 90 09/07/2022   AST 32 09/07/2022   ALT 28 09/07/2022   PROT 7.8 09/07/2022   ALBUMIN  3.6 09/07/2022   CALCIUM  9.3 12/28/2022   ANIONGAP 12 09/07/2022   EGFR 62 12/28/2022   GFR 58.31 (L) 09/09/2021   Lab Results  Component Value Date   CHOL 145 03/08/2023   Lab Results  Component Value Date   HDL 45 03/08/2023   Lab Results  Component Value Date   LDLCALC 82 03/08/2023   Lab Results  Component Value Date   TRIG 96 03/08/2023   Lab Results  Component Value Date   CHOLHDL 3.2 03/08/2023   Lab Results  Component Value Date   HGBA1C 6.1 (A) 06/23/2023      Assessment & Plan:   Problem List Items Addressed This Visit       Cardiovascular and Mediastinum   Benign essential HTN   Systolic blood pressure is slightly elevated but has been  previously well-controlled Continue torsemide  10 mg daily, metoprolol  12.5 mg twice daily DASH diet and commitment to daily physical activity for a minimum of 30 minutes discussed and encouraged, as a part of hypertension management. The importance of attaining a healthy weight is also discussed.     06/23/2023    4:17 PM 06/23/2023    1:40 PM 03/31/2023    9:56 AM 03/09/2023    1:30 PM 12/28/2022   11:45 AM 12/28/2022   11:22 AM 11/09/2022    9:19 AM  BP/Weight  Systolic BP 134 132  124 125 139   Diastolic BP 63 68  82 62 93   Wt. (Lbs)  336 339 339.6  342.6 331  BMI  51.09 kg/m2 51.54 kg/m2 51.64 kg/m2  52.09 kg/m2 50.33 kg/m2           Chronic diastolic CHF (congestive heart failure) (HCC) - Primary   Continue Jardiance  25 mg daily, metoprolol  12.5 mg twice daily, torsemide   10 mg daily Past due for follow-up with cardiology, need to follow-up with cardiology discussed      Relevant Medications   potassium chloride  SA (KLOR-CON  M20) 20 MEQ tablet   Other Relevant Orders   Ambulatory referral to Cardiology     Respiratory   COPD (chronic obstructive pulmonary disease) (HCC)   Currently on room air, he denies shortness of breath still has is trach in place, goes to the trach clinic for trach care missed his last appointment was encouraged to reschedule the appointment Continue albuterol  inhaler 2 puffs every 6 hours as needed, Dulera  2 puffs twice daily Maintain close follow-up with pulmonology        Endocrine   Type 2 diabetes mellitus with diabetic polyneuropathy, with long-term current use of insulin  (HCC)   Lab Results  Component Value Date   HGBA1C 6.1 (A) 06/23/2023  Chronic medical condition currently well-controlled States that he is currently on Lantus  38 units daily, Ozempic  0.5 mg once weekly, metformin  500 mg twice daily Continue current medications, may eventually be able to come off of Lantus  Patient counseled on low-carb diet Glucometer ordered, has not  been able to get glucometer at the pharmacy because he is insurance thanks that he is living at a facility Appreciate collaboration with the clinical pharmacist Encouraged to get his diabetes eye exam completed      Relevant Medications   Blood Glucose Monitoring Suppl DEVI   Glucose Blood (BLOOD GLUCOSE TEST STRIPS) STRP   Lancet Device MISC   Lancets Misc. MISC   empagliflozin  (JARDIANCE ) 25 MG TABS tablet   insulin  glargine (LANTUS  SOLOSTAR) 100 UNIT/ML Solostar Pen   Other Relevant Orders   Basic Metabolic Panel   Direct LDL   POCT glycosylated hemoglobin (Hb A1C) (Completed)   Microalbumin / creatinine urine ratio     Musculoskeletal and Integument   Gout of both feet   Continue allopurinol  100 mg daily for gout prevention      Relevant Medications   allopurinol  (ZYLOPRIM ) 100 MG tablet     Other   Dyslipidemia, goal LDL below 70   Lab Results  Component Value Date   CHOL 145 03/08/2023   HDL 45 03/08/2023   LDLCALC 82 03/08/2023   LDLDIRECT 105 (H) 12/28/2022   TRIG 96 03/08/2023   CHOLHDL 3.2 03/08/2023  On atorvastatin  40 mg daily Checking labs Avoid fatty fried foods      Chronic pain of left knee   Continue gabapentin  100 mg daily, Voltaren  gel, 2g 4 times daily       Relevant Medications   diclofenac  Sodium (VOLTAREN ) 1 % GEL    Meds ordered this encounter  Medications   Blood Glucose Monitoring Suppl DEVI    Sig: 1 each by Does not apply route in the morning, at noon, and at bedtime. May substitute to any manufacturer covered by patient's insurance.    Dispense:  1 each    Refill:  0   Glucose Blood (BLOOD GLUCOSE TEST STRIPS) STRP    Sig: 1 each by In Vitro route in the morning, at noon, and at bedtime. May substitute to any manufacturer covered by patient's insurance.    Dispense:  100 strip    Refill:  0   Lancet Device MISC    Sig: 1 each by Does not apply route in the morning, at noon, and at bedtime. May substitute to any manufacturer  covered by patient's insurance.    Dispense:  1 each  Refill:  0   Lancets Misc. MISC    Sig: 1 each by Does not apply route in the morning, at noon, and at bedtime. May substitute to any manufacturer covered by patient's insurance.    Dispense:  100 each    Refill:  0   potassium chloride  SA (KLOR-CON  M20) 20 MEQ tablet    Sig: Take 1 tablet (20 mEq total) by mouth daily.    Dispense:  90 tablet    Refill:  1    PATIENT NEEDS REFILL   empagliflozin  (JARDIANCE ) 25 MG TABS tablet    Sig: Take 1 tablet (25 mg total) by mouth daily before breakfast.    Dispense:  90 tablet    Refill:  3   allopurinol  (ZYLOPRIM ) 100 MG tablet    Sig: Take 1 tablet (100 mg total) by mouth daily.    Dispense:  90 tablet    Refill:  0   diclofenac  Sodium (VOLTAREN ) 1 % GEL    Sig: Apply 2 g topically 4 (four) times daily.    Dispense:  150 g    Refill:  5   insulin  glargine (LANTUS  SOLOSTAR) 100 UNIT/ML Solostar Pen    Sig: Inject 36 units into the skin once daily. May titrate up to 44 units daily if instructed by your provider.    Dispense:  15 mL    Refill:  3    Follow-up: Return in about 3 months (around 09/23/2023) for CPE.    Timothy Georgi R Mylo Choi, FNP

## 2023-06-23 NOTE — Assessment & Plan Note (Addendum)
 Lab Results  Component Value Date   HGBA1C 6.1 (A) 06/23/2023  Chronic medical condition currently well-controlled States that he is currently on Lantus  38 units daily, Ozempic  0.5 mg once weekly, metformin  500 mg twice daily Continue current medications, may eventually be able to come off of Lantus  Patient counseled on low-carb diet Glucometer ordered, has not been able to get glucometer at the pharmacy because he is insurance thanks that he is living at a facility Appreciate collaboration with the clinical pharmacist Encouraged to get his diabetes eye exam completed

## 2023-06-23 NOTE — Assessment & Plan Note (Signed)
 Continue Jardiance  25 mg daily, metoprolol  12.5 mg twice daily, torsemide  10 mg daily Past due for follow-up with cardiology, need to follow-up with cardiology discussed

## 2023-06-23 NOTE — Assessment & Plan Note (Signed)
 Lab Results  Component Value Date   CHOL 145 03/08/2023   HDL 45 03/08/2023   LDLCALC 82 03/08/2023   LDLDIRECT 105 (H) 12/28/2022   TRIG 96 03/08/2023   CHOLHDL 3.2 03/08/2023  On atorvastatin  40 mg daily Checking labs Avoid fatty fried foods

## 2023-06-23 NOTE — Progress Notes (Signed)
 Talked with patient in-room after appointment with PCP, [name], NP this afternoon to discuss diabetes medications.   A1C decreased from 6.5% to 6.1% today. He tells me that he is injecting 44 units of Lantus  nightly. He continues to tolerate Ozempic  0.5 mg once a week well. He has about 1.5 pens remaining.   Unfortunately, he has not been able to update his Medicaid to a non-long term care plan. He did visit the [facility] office, and they told him he would be getting forms in the mail to help him updated Medicaid. He has not gotten anything in the mail yet. Until this is updated, he cannot get a glucometer covered by the pharmacy. Therefore, he is at risk for hypoglycemia.   Because his A1C is lower today, will collaborate with his PCP to make the following changes: - Reduce Lantus  to 36 units daily - Continue Ozempic  0.5 mg weekly until current supply runs out, then increase to 1 mg weekly - Continue Jardiance  25 mg daily - Continue metformin  XR 500 mg BID  Follow-up: pharmacist telephone 07/07/23 to facilitate increase in Ozempic  to 1 mg and further dose reduction in Lantus .   .Arthea Larsson, PharmD PGY1 Pharmacy Resident

## 2023-06-23 NOTE — Assessment & Plan Note (Signed)
 Systolic blood pressure is slightly elevated but has been previously well-controlled Continue torsemide  10 mg daily, metoprolol  12.5 mg twice daily DASH diet and commitment to daily physical activity for a minimum of 30 minutes discussed and encouraged, as a part of hypertension management. The importance of attaining a healthy weight is also discussed.     06/23/2023    4:17 PM 06/23/2023    1:40 PM 03/31/2023    9:56 AM 03/09/2023    1:30 PM 12/28/2022   11:45 AM 12/28/2022   11:22 AM 11/09/2022    9:19 AM  BP/Weight  Systolic BP 134 132  124 125 139   Diastolic BP 63 68  82 62 93   Wt. (Lbs)  336 339 339.6  342.6 331  BMI  51.09 kg/m2 51.54 kg/m2 51.64 kg/m2  52.09 kg/m2 50.33 kg/m2

## 2023-06-23 NOTE — Assessment & Plan Note (Signed)
 Currently on room air, he denies shortness of breath still has is trach in place, goes to the trach clinic for trach care missed his last appointment was encouraged to reschedule the appointment Continue albuterol  inhaler 2 puffs every 6 hours as needed, Dulera  2 puffs twice daily Maintain close follow-up with pulmonology

## 2023-06-23 NOTE — Assessment & Plan Note (Signed)
 Currently on room air, still has is trach in place

## 2023-06-23 NOTE — Assessment & Plan Note (Signed)
 Continue allopurinol  100 mg daily for gout prevention

## 2023-06-23 NOTE — Assessment & Plan Note (Signed)
 Continue gabapentin  100 mg daily, Voltaren  gel, 2g 4 times daily

## 2023-06-24 ENCOUNTER — Other Ambulatory Visit (HOSPITAL_COMMUNITY): Payer: Self-pay

## 2023-06-24 ENCOUNTER — Other Ambulatory Visit: Payer: Self-pay

## 2023-06-24 LAB — BASIC METABOLIC PANEL WITH GFR
BUN/Creatinine Ratio: 15 (ref 10–24)
BUN: 17 mg/dL (ref 8–27)
CO2: 25 mmol/L (ref 20–29)
Calcium: 9.2 mg/dL (ref 8.6–10.2)
Chloride: 102 mmol/L (ref 96–106)
Creatinine, Ser: 1.1 mg/dL (ref 0.76–1.27)
Glucose: 90 mg/dL (ref 70–99)
Potassium: 3.8 mmol/L (ref 3.5–5.2)
Sodium: 146 mmol/L — ABNORMAL HIGH (ref 134–144)
eGFR: 76 mL/min/{1.73_m2} (ref >59)

## 2023-06-24 LAB — LDL CHOLESTEROL, DIRECT: LDL Direct: 65 mg/dL (ref 0–99)

## 2023-06-24 LAB — MICROALBUMIN / CREATININE URINE RATIO
Creatinine, Urine: 125.4 mg/dL
Microalb/Creat Ratio: 11 mg/g{creat} (ref 0–29)
Microalbumin, Urine: 13.7 ug/mL

## 2023-06-25 ENCOUNTER — Ambulatory Visit: Payer: Self-pay | Admitting: Nurse Practitioner

## 2023-06-25 ENCOUNTER — Other Ambulatory Visit: Payer: Self-pay | Admitting: Licensed Clinical Social Worker

## 2023-06-27 NOTE — Progress Notes (Deleted)
 HPI  M former smoker followed for COPD, chronic hypoxic respiratory failure OSA, OHS, complicated by morbid obesity, DM2, CHF, anemia PFT 09/21/13-severe obstruction with slight response to bronchodilator, restriction of exhaled volume, mild reduction diffusion NPSG 08/08/13- AHI 21.1/ hr, desaturation to 80%, body weight 385 lbs BIPAP titration 09/05/15 optimal pressure 22/18, body weight 365 lbs PFT 10/11/2018- Severe obstruction with response to dilator. Severe restriction, diffusion moderately reduced. FVC 2.16/ 57%, FEV1 1.77/ 59%, Ratio 0.82, TLC 66%, DLCO 66% -------------------------------------------------------- 11/24/19-63 year old male former smoker followed for COPD, hypoxic respiratory failure/ Tracheostomy/ Tracheal Stenosis, OSA, complicated by morbid obesity, OHS, DM2, CHF/ atherosclerosis, hx Thyroidectomy for goiter, COPD, Morbid Obesity,  (BIPAP 22/18/ [facility]/ [facility] )- tracheostomy now Tracheostomy revision by Dr Donalee Fruits 8/11 ED 10/28 to replace dislodged trach tube     # 5 Extra Long Shiley Dulera  100, Neb Duoneb,  Body weight today- 333 lbs Covid vax- none Flu vax- had Follows with [facility]- Sheryle Donning NP told him to expect to need trach another 6 months, which will be largely contingent on his ability to keep weight down.  Has old O2 concentrator, not using. Sleeps with trach open, humidified room air by trach mask. Does well during day with PM valve on trach tube. PICC line and feeding tube are out. Using Dulera . Rarely uses nebulizer- has meds.   03/08/13- 63 year old male former smoker followed for COPD, hypoxic respiratory failure/ Tracheostomy/ Tracheal Stenosis, OSA, complicated by morbid obesity, OHS, DM2, CHF/ atherosclerosis, hx Thyroidectomy for goiter,HF, DVT (s/p thrombectomy) and balloon angioplasty on Eliquis  , Referred back now by Dr Becki Bouton Patel/ [facility]. Wants to be decannulated but has OSA. (BIPAP 22/18/ [facility]/ [facility] )- tracheostomy now Current  trach: [id number] - looks like last documented change 05/2020; uses PMV  Dulera  100, Neb Duoneb, Ventolin  hfa,  Body weight today- 339 lbs CXR 1V - 09/07/22 MPRESSION: No acute airspace disease. Discussed the use of AI scribe software for clinical note transcription with the patient, who gave verbal consent to proceed.  History of Present Illness   The patient, with a history of significant sleep apnea managed with a tracheostomy, presents for follow-up. He reports that he no longer uses his BiPAP machine and has been managing his tracheostomy independently, using a speaking valve during the day and plugging the tracheostomy at night. He reports no significant difference in his sleep quality or daytime alertness with the tracheostomy plugged or unplugged. He has recently moved to a new apartment and reports improved sleep quality since the move. He denies using any sleep aids and reports consuming four cups of coffee each morning. He also reports occasional daytime napping. He is followed at [facility].  In addition to his sleep apnea, the patient has been managing a hearing loss in his right ear, which he attributes to wax build-up. He has been advised to purchase an over-the-counter earwax removal kit. He also reports a recent visit to his primary care provider for a blood draw and has been managing his diabetes with insulin , for which he needs new pen needles.      Assessment and Plan:    Tracheostomy Patient with tracheostomy, not using BiPAP machine. No significant difference in sleep quality with tracheostomy plugged or unplugged. -Plan for sleep study with tracheostomy plugged to assess oxygen levels and breathing during sleep.  Sleep Apnea Patient reports improved sleep since moving to a new apartment. Consumes four cups of coffee in the morning. -Continue monitoring sleep quality.  Ear Wax Impaction  Patient reports decreased hearing in right ear. Examination reveals  significant ear wax impaction. -Recommend over-the-counter ear wax removal kit.  Asthma Patient requires refill of rescue inhaler. -Send prescription for rescue inhaler to [facility].  General Health Maintenance -Order chest x-ray, last done approximately a year ago. -Plan for follow-up with [facility] after current visit.   06/29/23- 63 year old male former smoker followed for COPD, hypoxic respiratory failure/ Tracheostomy/ Tracheal Stenosis, OSA, complicated by morbid obesity, OHS, DM2, CHF/ atherosclerosis, hx Thyroidectomy for goiter,HF, DVT (s/p thrombectomy) and balloon angioplasty on Eliquis  , Referred back now by Dr Becki Bouton Patel/ [facility]. Wants to be decannulated but has OSA. (BIPAP 22/18/ [facility]/ [facility] )- tracheostomy now Current trach: [id number] - looks like last documented change 05/2020; uses PMV  Dulera  100, Neb Duoneb, Ventolin  hfa,  Body weight today-  He was to have sleep study with trach plugged- not done??       ROS-see HPI    + = positive Constitutional:    +weight loss, night sweats, fevers, chills, + fatigue, lassitude. HEENT:   No-  headaches, difficulty swallowing, tooth/dental problems, sore throat,      sneezing, itching, ear ache, nasal congestion, post nasal drip,  CV:  No-   chest pain, +orthopnea, PND, swelling in lower extremities, no-anasarca, dizziness, palpitations Resp: +shortness of breath with exertion or at rest.                productive cough,  + non-productive cough,  No- coughing up of blood.              No-   change in color of mucus.   wheezing.   Skin: No-   rash or lesions. GI:  No-   heartburn, indigestion, abdominal pain,no- nausea, vomiting,  GU:  MS:  + joint pain or swelling.  . Neuro-     nothing unusual Psych:  No- change in mood or affect. No depression or anxiety.  No memory loss.  OBJ- Physical Exam General- Alert, Oriented, Affect-appropriate, Distress- none acute,  + morbidly obese,    . 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 Lymphadenopathy- none Head- atraumatic            Eyes- Gross vision intact, PERRLA, conjunctivae and secretions clear            Ears- Hearing, canals-normal            Nose- Clear, no-Septal dev, mucus, polyps, erosion, perforation             Throat- Mallampati IV , mucosa clear , drainage- none, tonsils- atrophic,  Neck- + Breathing unlabored at rest on room air. +tracheostomy/ PMValve. Chest - symmetrical excursion , unlabored           Heart/CV- RRR, distant , no murmur , no gallop  , no rub, nl s1 s2                           - JVD- none , edema+ 1-2, stasis changes- none, varices- none           Lung- diminished/clear , wheeze- none, cough+raspy / stridorous , dullness-none, rub- none                                   Chest wall-  Abd-  Br/ Gen/ Rectal-  Not done, not indicated Extrem- cyanosis- none, clubbing, none, atrophy- none, strength- nl, + cane Neuro- grossly intact to observation

## 2023-06-29 ENCOUNTER — Ambulatory Visit: Admitting: Internal Medicine

## 2023-06-29 ENCOUNTER — Encounter: Payer: Self-pay | Admitting: Internal Medicine

## 2023-06-29 ENCOUNTER — Ambulatory Visit (INDEPENDENT_AMBULATORY_CARE_PROVIDER_SITE_OTHER): Admitting: Podiatry

## 2023-06-29 ENCOUNTER — Encounter: Payer: Self-pay | Admitting: Podiatry

## 2023-06-29 DIAGNOSIS — M79674 Pain in right toe(s): Secondary | ICD-10-CM | POA: Diagnosis not present

## 2023-06-29 DIAGNOSIS — B351 Tinea unguium: Secondary | ICD-10-CM

## 2023-06-29 DIAGNOSIS — M79675 Pain in left toe(s): Secondary | ICD-10-CM

## 2023-06-29 DIAGNOSIS — E1165 Type 2 diabetes mellitus with hyperglycemia: Secondary | ICD-10-CM

## 2023-06-29 DIAGNOSIS — Z794 Long term (current) use of insulin: Secondary | ICD-10-CM

## 2023-06-29 NOTE — Progress Notes (Signed)
 This patient returns to my office for at risk foot care.  This patient requires this care by a professional since this patient will be at risk due to having diabetes  This patient is unable to cut nails himself since the patient cannot reach his nails.These nails are painful walking and wearing shoes.  This patient presents for at risk foot care today.  General Appearance  Alert, conversant and in no acute stress.  Vascular  Dorsalis pedis and posterior tibial  pulses are  not   bilaterally due to severe swelling..  Capillary return is within normal limits  bilaterally. Temperature is within normal limits  bilaterally.  Neurologic  Senn-Weinstein monofilament wire test within normal limits  bilaterally. Muscle power within normal limits bilaterally.  Nails Thick disfigured discolored nails with subungual debris  from hallux to fifth toes bilaterally. No evidence of bacterial infection or drainage bilaterally.  Orthopedic  No limitations of motion  feet .  No crepitus or effusions noted.  No bony pathology or digital deformities noted.  HAV  B/L.  Skin  normotropic skin with no porokeratosis noted bilaterally.  No signs of infections or ulcers noted.   Plantar callus along weight bearing areas both feet.  Onychomycosis  Pain in right toes  Pain in left toes  Consent was obtained for treatment procedures.   Mechanical debridement of nails 1-5  bilaterally performed with a nail nipper.  Filed with dremel without incident.    Return office visit      4  months              Told patient to return for periodic foot care and evaluation due to potential at risk complications.   Helane Gunther DPM

## 2023-07-05 NOTE — Patient Instructions (Signed)
 Visit Information  Thank you for taking time to visit with me today. Please don't hesitate to contact me if I can be of assistance to you before our next scheduled appointment.  Your next care management appointment is by telephone on 07/11 at 11 AM  Please call the care guide team at [phone] if you need to cancel, schedule, or reschedule an appointment.   Please call the Suicide and Crisis Lifeline: 988 go to [facility] [address] [place] [phone]) call 911 if you are experiencing a Mental Health or Behavioral Health Crisis or need someone to talk to.  Alease Hunter, LCSW [place]  [facility], [facility] Clinical Social Worker Direct Dial: [phone]  Fax: [phone] Website: [email or web address] 9:14 AM

## 2023-07-05 NOTE — Patient Outreach (Signed)
 Complex Care Management   Visit Note  06/25/2023  Name:  Timothy Le MRN: [id number] DOB: February 21, 1960  Situation: Referral received for Complex Care Management related to SDOH Barriers:  Applying for Medicaid and Supportive Resources I obtained verbal consent from Patient.  Visit completed with pt  on the phone  Background:   Past Medical History:  Diagnosis Date   Asthma    Chronic respiratory failure with hypoxia (HCC) 07/09/2017   COPD (chronic obstructive pulmonary disease) (HCC)    Diabetes mellitus without complication (HCC)    Difficult intubation    Hypertension    Severe obesity (BMI >= 40) (HCC)    Shortness of breath dyspnea    Sleep apnea    Tracheobronchitis     Assessment: Patient Reported Symptoms:  Cognitive Cognitive Status: Alert and oriented to person, place, and time, Normal speech and language skills      Neurological Neurological Review of Symptoms: No symptoms reported    HEENT HEENT Symptoms Reported: No symptoms reported      Cardiovascular Cardiovascular Symptoms Reported: No symptoms reported    Respiratory Respiratory Symptoms Reported: No symptoms reported    Endocrine Patient reports the following symptoms related to hypoglycemia or hyperglycemia : No symptoms reported Is patient diabetic?: Yes Is patient checking blood sugars at home?: No Endocrine Conditions: Diabetes Endocrine Management Strategies: Medication therapy, Routine screening  Gastrointestinal Gastrointestinal Symptoms Reported: No symptoms reported      Genitourinary Genitourinary Symptoms Reported: No symptoms reported    Integumentary Integumentary Symptoms Reported: Not assessed    Musculoskeletal Musculoskelatal Symptoms Reviewed: Not assessed        Psychosocial Psychosocial Symptoms Reported: No symptoms reported     Quality of Family Relationships: helpful, involved, supportive Do you feel physically threatened by others?: No      06/23/2023    1:48 PM   Depression screen PHQ 2/9  Decreased Interest 0  Down, Depressed, Hopeless 0  PHQ - 2 Score 0  Altered sleeping 0  Tired, decreased energy 0  Change in appetite 0  Feeling bad or failure about yourself  0  Trouble concentrating 0  Moving slowly or fidgety/restless 0  Suicidal thoughts 0  PHQ-9 Score 0  Difficult doing work/chores Not difficult at all    There were no vitals filed for this visit.  Medications Reviewed Today     Reviewed by Adriana Albany, LCSW (Social Worker) on 07/05/23 at 0909  Med List Status: <None>   Medication Order Taking? Sig Documenting Provider Last Dose Status Informant  Accu-Chek Softclix Lancets lancets [id number] No Use to check blood sugar three times daily. [name], [name], FNP Taking Active            Med Note [name], Cedrick Partain D   Tue Jun 01, 2023 10:27 AM) Pt has contacted Medicaid to see if they will cover this   albuterol  (VENTOLIN  HFA) 108 (90 Base) MCG/ACT inhaler [id number] No Inhale 2 puffs into the lungs every 6 (six) hours as needed for wheezing or shortness of breath. Faustina Hood, MD Taking Active   allopurinol  (ZYLOPRIM ) 100 MG tablet [id number] No Take 1 tablet (100 mg total) by mouth daily. Jerrlyn Morel, NP Taking Active   allopurinol  (ZYLOPRIM ) 100 MG tablet [id number] No Take 1 tablet (100 mg total) by mouth daily. [name], [name], FNP Taking Active   apixaban  (ELIQUIS ) 5 MG TABS tablet [id number] No Take 1 tablet (5 mg total) by mouth 2 (two) times daily. [name], Folashade  R, FNP Taking Active   atorvastatin  (LIPITOR) 40 MG tablet [id number] No Take 1 tablet (40 mg total) by mouth daily. [name], [name], FNP Taking Active   Blood Glucose Monitoring Suppl DEVI [id number] No 1 each by Does not apply route in the morning, at noon, and at bedtime. May substitute to any manufacturer covered by patient's insurance. [name], [name], FNP Taking Active   diclofenac  Sodium (VOLTAREN ) 1 % GEL [id number] No Apply 2 g topically 4  (four) times daily. [name], [name], FNP Taking Active   empagliflozin  (JARDIANCE ) 25 MG TABS tablet [id number] No Take 1 tablet (25 mg total) by mouth daily before breakfast. [name], [name], FNP Taking Active   gabapentin  (NEURONTIN ) 100 MG capsule [id number] No Take 1 capsule (100 mg total) by mouth daily. [name], [name], FNP Taking Active   Glucose Blood (BLOOD GLUCOSE TEST STRIPS) STRP [id number] No 1 each by In Vitro route in the morning, at noon, and at bedtime. May substitute to any manufacturer covered by patient's insurance. [name], [name], FNP Taking Active   insulin  glargine (LANTUS  SOLOSTAR) 100 UNIT/ML Solostar Pen [id number] No Inject 36 units into the skin once daily. May titrate up to 44 units daily if instructed by your provider. [name], [name], FNP Taking Active   Insulin  Pen Needle (PEN NEEDLES) 32G X 4 MM MISC [id number] No Use to inject Lantus  daily [name], [name], FNP Taking Active   Lancet Device MISC [id number] No 1 each by Does not apply route in the morning, at noon, and at bedtime. May substitute to any manufacturer covered by patient's insurance. [name], [name], FNP Taking Active   Lancets Misc. MISC [id number] No 1 each by Does not apply route in the morning, at noon, and at bedtime. May substitute to any manufacturer covered by patient's insurance. [name], [name], FNP Taking Active   metFORMIN  (GLUCOPHAGE -XR) 500 MG 24 hr tablet [id number] No Take 1 tablet (500 mg total) by mouth in the morning and at bedtime. [name], [name], FNP Taking Active            Med Note Philmore Bream   Mon Apr 12, 2023  2:21 PM) Taking one tablet daily  metoprolol  tartrate (LOPRESSOR ) 25 MG tablet [id number] No Take 0.5 tablets (12.5 mg total) by mouth 2 (two) times daily. [name], [name], FNP Taking Active   mometasone -formoterol  (DULERA ) 100-5 MCG/ACT AERO [id number] No Inhale 2 puffs into the lungs 2 (two) times daily. [name], [name], FNP Taking  Active   pantoprazole  (PROTONIX ) 40 MG tablet [id number] No Take 1 tablet (40 mg total) by mouth at bedtime. [name], [name], FNP Taking Active   Phenylephrine -DM-GG (MUCINEX  FAST-MAX CONGEST COUGH) 2.5-5-100 MG/5ML LIQD [id number] No Take 20 mLs by mouth daily as needed (cough and chest congestion). [provider] Taking Active Self  potassium chloride  SA (KLOR-CON  M20) 20 MEQ tablet [id number] No Take 1 tablet (20 mEq total) by mouth daily. [name], [name], FNP Taking Active   QUEtiapine  (SEROQUEL ) 100 MG tablet [id number] No Take 1 tablet (100 mg total) by mouth 2 (two) times daily. [name], [name], FNP Taking Active   Semaglutide ,0.25 or 0.5MG /DOS, (OZEMPIC , 0.25 OR 0.5 MG/DOSE,) 2 MG/3ML SOPN [id number] No Inject 0.5 mg into the skin once a week. [name], [name], FNP Taking Active   senna-docusate (SENOKOT-S) 8.6-50 MG tablet [id number] No Take 2 tablets by mouth at bedtime as needed for moderate constipation. Maggie Schooner, MD Taking Active  sertraline  (ZOLOFT ) 100 MG tablet [id number] No Take 1 tablet (100 mg total) by mouth daily. [name], [name], FNP Taking Active   torsemide  (DEMADEX ) 10 MG tablet [id number] No Take 1 tablet (10 mg total) by mouth daily. [name], [name], FNP Taking Active             Recommendation:   Continue Current Plan of Care  Follow Up Plan:   Telephone follow-up in 1 month  Alease Hunter, LCSW [facility]  [facility], [facility] Clinical Social Worker Direct Dial: [phone]  Fax: [phone] Website: [email or web address] 9:13 AM

## 2023-07-07 ENCOUNTER — Telehealth: Payer: Self-pay

## 2023-07-07 ENCOUNTER — Other Ambulatory Visit (INDEPENDENT_AMBULATORY_CARE_PROVIDER_SITE_OTHER): Payer: Self-pay

## 2023-07-07 ENCOUNTER — Other Ambulatory Visit (HOSPITAL_COMMUNITY): Payer: Self-pay

## 2023-07-07 ENCOUNTER — Other Ambulatory Visit: Payer: Self-pay

## 2023-07-07 DIAGNOSIS — E1142 Type 2 diabetes mellitus with diabetic polyneuropathy: Secondary | ICD-10-CM

## 2023-07-07 DIAGNOSIS — Z794 Long term (current) use of insulin: Secondary | ICD-10-CM

## 2023-07-07 MED ORDER — OZEMPIC (1 MG/DOSE) 4 MG/3ML ~~LOC~~ SOPN
1.0000 mg | PEN_INJECTOR | SUBCUTANEOUS | 3 refills | Status: DC
  Filled 2023-07-07: qty 9, 84d supply, fill #0

## 2023-07-07 NOTE — Telephone Encounter (Signed)
 Pharmacy Patient Advocate Encounter   Received notification from CoverMyMeds that prior authorization for OZEMPIC  1MG  is required/requested.   Insurance verification completed.   The patient is insured through [employer] .   Per test claim: PA required; PA submitted to above mentioned insurance via CoverMyMeds Key/confirmation #/EOC [id number] Status is pending

## 2023-07-07 NOTE — Progress Notes (Signed)
 07/07/2023 Name: Timothy Timothy Le MRN: [id number] DOB: Nov 10, 1960  Chief Complaint  Patient presents with   Diabetes   Hypertension   Hyperlipidemia    Timothy Timothy Le is a 63 y.o. year old Timothy Le who presented for a telephone visit. PMH includes HFpEF, severe OSA, HTN, COPD, chronic respiratory failure with tracheostomy (followed at [facility] tracheostomy clinic), T2DM, BMI > 50.   They were referred to the pharmacist by their PCP for assistance in managing diabetes.   Subjective: Patient was last seen by PCP, Timothy Lu, NP on 06/23/23. A1C had decreased from 6.5% to 6.1%. He reported injected 44 units of Lantus  daily along with Ozempic  0.5 mg weekly, metformin  500 mg BID, and Jardiance  25 mg daily. He was instructed to reduce Lantus  to 36 units daily to reduce risk of hypoglycemia. His Medicaid insurance still had not been updated from a long-term care plan and glucometer + supplies could not be filled.   Today, patient reports doing well. He made the changes discussed at his previous visit. He now has Medicaid via [employer]. He reports that he needs refills on metformin , Jardiance , torsemide . He also is almost out of Ozempic . He is still interested in increasing to Ozempic  1 mg weekly today.   Care Team: Primary Care Provider: [name], [name], FNP ; Next Scheduled Visit: 09/24/23 Pulmonology: Timothy College, MD: Next Scheduled Visit: 06/29/23  Medication Access/Adherence  Current Pharmacy:  Timothy Timothy Le - [facility] [address] [place] [address] Phone: [phone] Fax: [phone]  [id number] - [place], [place] - [address] AT [facility] [address] [place] [address] Phone: [phone] Fax: [phone]   Patient reports affordability concerns with their medications: No  Patient reports access/transportation concerns to their pharmacy: No  Patient reports adherence concerns with  their medications:  No  - Reports it is time for a refill on his Ozempic    Diabetes:  Current medications: Ozempic  0.5 mg weekly (Saturdays - thinks he has 1 dose left), Jardiance  25 mg daily, metformin  XR 500 mg BID (taking once daily - reports he needs a refill), Lantus  36 units daily   He denies GI AE on current dose of Ozempic . Continues to report appetite suppression. His snacking has decreased.  He has been stopping eating when he feels full.   He reports that he is not interested in CGM at this time. He has a flip phone, and does not want to use a reader to check the values.   Patient denies hypoglycemic s/sx including dizziness, shakiness. He reports he has had some sweating and increased thirst recently, but believes it is due to the heat. Patient denies hyperglycemic symptoms including polyuria, polydipsia, polyphagia, nocturia, neuropathy, blurred vision. He does report that he sees some black spots in his vision, but this is usual for him - he is due for ophthalmology appt.   Current meal patterns: 2 meals/day - Breakfast: sausage, eggs - Supper: [place] burger, salad - Snacks: previously eating 2 sticky buns/day but says he has cut back - had one sticky bun last week. - Drinks: water , fruit juice once a day, coffee with cream and splenda  Hyperlipidemia/ASCVD Risk Reduction  Current lipid lowering medications: atorvastatin  40 mg daily  The 10-year ASCVD risk score (Arnett DK, et al., 2019) is: 25.4%   Values used to calculate the score:     Age: 48 years     Clincally relevant sex: Timothy Le  Is Non-Hispanic African American: Yes     Diabetic: Yes     Tobacco smoker: No     Systolic Blood Pressure: 134 mmHg     Is BP treated: Yes     HDL Cholesterol: 45 mg/dL     Total Cholesterol: 145 mg/dL   Objective:  BP Readings from Last 3 Encounters:  06/23/23 134/63  03/09/23 124/82  12/28/22 125/62    Lab Results  Component Value Date   HGBA1C 6.1 (A) 06/23/2023    UACR 06/23/23: 11 mg/g (prev 34 mg/g)  Lab Results  Component Value Date   CREATININE 1.10 06/23/2023   BUN 17 06/23/2023   NA 146 (H) 06/23/2023   K 3.8 06/23/2023   CL 102 06/23/2023   CO2 25 06/23/2023    Lab Results  Component Value Date   CHOL 145 03/08/2023   HDL 45 03/08/2023   LDLCALC 82 03/08/2023   LDLDIRECT 65 06/23/2023   TRIG 96 03/08/2023   CHOLHDL 3.2 03/08/2023    Medications Reviewed Today     Reviewed by Timothy Timothy Le, RPH (Pharmacist) on 07/07/23 at 1625  Med List Status: <None>   Medication Order Taking? Sig Documenting Provider Last Dose Status Informant  Accu-Chek Softclix Lancets lancets [id number]  Use to check blood sugar three times daily. [name], [name], FNP  Active            Med Note [name], Timothy Timothy Le   Tue Jun 01, 2023 10:27 AM) Pt has contacted Medicaid to see if they will cover this   albuterol  (VENTOLIN  HFA) 108 (90 Base) MCG/ACT inhaler [id number]  Inhale 2 puffs into the lungs every 6 (six) hours as needed for wheezing or shortness of breath. Timothy Timothy Le D, MD  Active   allopurinol  (ZYLOPRIM ) 100 MG tablet [id number]  Take 1 tablet (100 mg total) by mouth daily. Timothy Morel, NP  Active   allopurinol  (ZYLOPRIM ) 100 MG tablet [id number]  Take 1 tablet (100 mg total) by mouth daily. [name], [name], FNP  Active   apixaban  (ELIQUIS ) 5 MG TABS tablet [id number] Yes Take 1 tablet (5 mg total) by mouth 2 (two) times daily. [name], [name], FNP  Active   atorvastatin  (LIPITOR) 40 MG tablet [id number] Yes Take 1 tablet (40 mg total) by mouth daily. [name], [name], FNP  Active   Blood Glucose Monitoring Suppl DEVI [id number]  1 each by Does not apply route in the morning, at noon, and at bedtime. May substitute to any manufacturer covered by patient's insurance. [name], [name], FNP  Active   diclofenac  Sodium (VOLTAREN ) 1 % GEL [id number]  Apply 2 g topically 4 (four) times daily. [name], [name], FNP  Active   empagliflozin   (JARDIANCE ) 25 MG TABS tablet [id number] Yes Take 1 tablet (25 mg total) by mouth daily before breakfast. [name], [name], FNP  Active   gabapentin  (NEURONTIN ) 100 MG capsule [id number] Yes Take 1 capsule (100 mg total) by mouth daily. [name], [name], FNP  Active   Glucose Blood (BLOOD GLUCOSE TEST STRIPS) STRP [id number]  1 each by In Vitro route in the morning, at noon, and at bedtime. May substitute to any manufacturer covered by patient's insurance. [name], [name], FNP  Active   insulin  glargine (LANTUS  SOLOSTAR) 100 UNIT/ML Solostar Pen [id number] Yes Inject 36 units into the skin once daily. May titrate up to 44 units daily if instructed by your provider. [name], [name], FNP  Active   Insulin  Pen Needle (  PEN NEEDLES) 32G X 4 MM MISC [id number]  Use to inject Lantus  daily [name], [name], FNP  Active   Lancet Device MISC [id number]  1 each by Does not apply route in the morning, at noon, and at bedtime. May substitute to any manufacturer covered by patient's insurance. [name], [name], FNP  Active   Lancets Misc. MISC [id number]  1 each by Does not apply route in the morning, at noon, and at bedtime. May substitute to any manufacturer covered by patient's insurance. [name], [name], FNP  Active   metFORMIN  (GLUCOPHAGE -XR) 500 MG 24 hr tablet [id number] Yes Take 1 tablet (500 mg total) by mouth in the morning and at bedtime. [name], [name], FNP  Active            Med Note Philmore Bream   Mon Apr 12, 2023  2:21 PM) Taking one tablet daily  metoprolol  tartrate (LOPRESSOR ) 25 MG tablet [id number] Yes Take 0.5 tablets (12.5 mg total) by mouth 2 (two) times daily. [name], [name], FNP  Active   mometasone -formoterol  (DULERA ) 100-5 MCG/ACT AERO [id number] Yes Inhale 2 puffs into the lungs 2 (two) times daily. [name], [name], FNP  Active   pantoprazole  (PROTONIX ) 40 MG tablet [id number]  Take 1 tablet (40 mg total) by mouth at bedtime.  Patient not taking: Reported on  07/07/2023   [name], [name], FNP  Active   Phenylephrine -DM-GG (MUCINEX  FAST-MAX CONGEST COUGH) 2.5-5-100 MG/5ML LIQD [id number]  Take 20 mLs by mouth daily as needed (cough and chest congestion). [provider]  Active Self  potassium chloride  SA (KLOR-CON  M20) 20 MEQ tablet [id number]  Take 1 tablet (20 mEq total) by mouth daily. [name], [name], FNP  Consider Medication Status and Discontinue (Patient has not taken in last 30 days)   QUEtiapine  (SEROQUEL ) 100 MG tablet [id number] Yes Take 1 tablet (100 mg total) by mouth 2 (two) times daily. [name], [name], FNP  Active   Semaglutide ,0.25 or 0.5MG /DOS, (OZEMPIC , 0.25 OR 0.5 MG/DOSE,) 2 MG/3ML SOPN [id number] Yes Inject 0.5 mg into the skin once a week. [name], [name], FNP  Active   senna-docusate (SENOKOT-S) 8.6-50 MG tablet [id number]  Take 2 tablets by mouth at bedtime as needed for moderate constipation. Maggie Schooner, MD  Active   sertraline  (ZOLOFT ) 100 MG tablet [id number] Yes Take 1 tablet (100 mg total) by mouth daily. [name], [name], FNP  Active   torsemide  (DEMADEX ) 10 MG tablet [id number] Yes Take 1 tablet (10 mg total) by mouth daily. [name], [name], FNP  Active              Assessment/Plan:     Diabetes: - Currently controlled based on last A1c 6.1%, below goal <7%. Patient is a great candidate for GLP-1 given BMI >50. He is tolerating Ozempic  0.5 mg well. He is interested in increasing to Ozempic  1 mg weekly to assist with further weight loss and reduce dose of basal insulin . Since patient now has Managed Medicaid, we can fill his glucometer supplies for him to monitor for hypoglycemia at home.  - Reviewed long term cardiovascular and renal outcomes of uncontrolled blood sugar - Reviewed goal A1c, goal fasting, and goal 2 hour post prandial glucose - Reviewed dietary modifications including increasing intake of protein and non-starchy vegetables. Limit portion sizes of carbohydrate foods. -  Recommend to increase Ozempic  to 1 mg weekly. PA required with new insurance (key: [id number]) - Recommend to decrease Lantus  to 28 units daily  once he starts Ozempic  1 mg weekly. - Recommend to continue Jardiance  25 mg daily - Recommend to continue metformin  XR 500  mg BID. Encouraged to take twice daily. - Recommend to check fasting blood glucose daily. Accu-Chek supplies covered with new Medicaid insurance. Facilitated fill at [facility] for mail order.  - Most recent UACR WNL - Next A1C due 09/24/23   Hyperlipidemia/ASCVD Risk Reduction:  Currently controlled with last direct LDL of 65 mg/dL below goal <47 mg/dL given dx of DM. Appropriate to continue high intensity statin. - Reviewed long term complications of uncontrolled cholesterol - Reviewed dietary recommendations including cutting back saturated fats and excess carbohydrates - Recommend to continue atorvastatin  40 mg daily   Helped patient facilitate refills at [facility] for metformin , Jardiance , torsemide . Noted that PA also required for continuation of Jardiance  therapy.   Follow Up Plan: PharmD telephone 06/06/23 and PCP 06/23/23  Arthea Larsson, PharmD PGY1 Pharmacy Resident

## 2023-07-07 NOTE — Telephone Encounter (Addendum)
 Patient has new Medicaid insurance and prior authorization is required for continuation of Ozempic  and Jardiance  therapy. Will collaborate with PA team to complete authorization.   Arthea Larsson, PharmD PGY1 Pharmacy Resident

## 2023-07-08 ENCOUNTER — Other Ambulatory Visit: Payer: Self-pay

## 2023-07-08 ENCOUNTER — Other Ambulatory Visit (HOSPITAL_COMMUNITY): Payer: Self-pay

## 2023-07-08 NOTE — Telephone Encounter (Signed)
 Pharmacy Patient Advocate Encounter   Received notification from CoverMyMeds that prior authorization for JARDIANCE  is required/requested.   Insurance verification completed.   The patient is insured through [facility] .   Per test claim: PA required; PA submitted to above mentioned insurance via CoverMyMeds Key/confirmation #/EOC [id number] Status is pending

## 2023-07-14 ENCOUNTER — Other Ambulatory Visit: Payer: Self-pay

## 2023-07-30 ENCOUNTER — Other Ambulatory Visit: Payer: Self-pay | Admitting: Licensed Clinical Social Worker

## 2023-08-05 NOTE — Patient Outreach (Signed)
 Complex Care Management   Visit Note  07/30/2023  Name:  Timothy Le MRN: [id number] DOB: 06/16/1960  Situation: Referral received for Complex Care Management related to Mental/Behavioral Health diagnosis Depression with Anxiety I obtained verbal consent from Patient.  Visit completed with pt  on the phone  Background:   Past Medical History:  Diagnosis Date   Asthma    Chronic respiratory failure with hypoxia (HCC) 07/09/2017   COPD (chronic obstructive pulmonary disease) (HCC)    Diabetes mellitus without complication (HCC)    Difficult intubation    Hypertension    Severe obesity (BMI >= 40) (HCC)    Shortness of breath dyspnea    Sleep apnea    Tracheobronchitis     Assessment: Patient Reported Symptoms:  Cognitive Cognitive Status: No symptoms reported, Alert and oriented to person, place, and time, Normal speech and language skills Cognitive/Intellectual Conditions Management [RPT]: None reported or documented in medical history or problem list   Health Maintenance Behaviors: Annual physical exam  Neurological Neurological Review of Symptoms: No symptoms reported    HEENT HEENT Symptoms Reported: No symptoms reported      Cardiovascular Cardiovascular Symptoms Reported: No symptoms reported    Respiratory Respiratory Symptoms Reported: No symptoms reported    Endocrine Endocrine Symptoms Reported: No symptoms reported Is patient diabetic?: Yes Is patient checking blood sugars at home?: Yes    Gastrointestinal Gastrointestinal Symptoms Reported: No symptoms reported      Genitourinary Genitourinary Symptoms Reported: Not assessed    Integumentary Integumentary Symptoms Reported: Not assessed    Musculoskeletal Musculoskelatal Symptoms Reviewed: Unsteady gait Additional Musculoskeletal Details: Patient reports that he has gotten stronger and has decreased in using cane Musculoskeletal Management Strategies: Coping strategies, Routine screening       Psychosocial Psychosocial Symptoms Reported: No symptoms reported Behavioral Management Strategies: Adequate rest, Coping strategies, Support system Major Change/Loss/Stressor/Fears (CP): Medical condition, self Techniques to Cope with Loss/Stress/Change: Diversional activities, Spiritual practice(s) Quality of Family Relationships: helpful, involved, supportive      06/23/2023    1:48 PM  Depression screen PHQ 2/9  Decreased Interest 0  Down, Depressed, Hopeless 0  PHQ - 2 Score 0  Altered sleeping 0  Tired, decreased energy 0  Change in appetite 0  Feeling bad or failure about yourself  0  Trouble concentrating 0  Moving slowly or fidgety/restless 0  Suicidal thoughts 0  PHQ-9 Score 0  Difficult doing work/chores Not difficult at all    There were no vitals filed for this visit.  Medications Reviewed Today     Reviewed by Ezzard Rolin BIRCH, LCSW (Social Worker) on 07/30/23 at 1113  Med List Status: <None>   Medication Order Taking? Sig Documenting Provider Last Dose Status Informant  Accu-Chek Softclix Lancets lancets [id number] No Use to check blood sugar three times daily. [name], [name], FNP Taking Active            Med Note ANGELICA, Bodey Frizell D   Tue Jun 01, 2023 10:27 AM) Pt has contacted Medicaid to see if they will cover this   albuterol  (VENTOLIN  HFA) 108 (90 Base) MCG/ACT inhaler [id number] No Inhale 2 puffs into the lungs every 6 (six) hours as needed for wheezing or shortness of breath. Neysa Rama D, MD Taking Active   allopurinol  (ZYLOPRIM ) 100 MG tablet [id number] No Take 1 tablet (100 mg total) by mouth daily. Oley Bascom GORMAN, NP Taking Active   allopurinol  (ZYLOPRIM ) 100 MG tablet [id number] No Take 1 tablet (100 mg  total) by mouth daily. [name], [name], FNP Taking Active   apixaban  (ELIQUIS ) 5 MG TABS tablet [id number]  Take 1 tablet (5 mg total) by mouth 2 (two) times daily. [name], [name], FNP  Active   atorvastatin  (LIPITOR) 40 MG tablet [id number]   Take 1 tablet (40 mg total) by mouth daily. [name], [name], FNP  Active   Blood Glucose Monitoring Suppl (BLOOD GLUCOSE MONITOR SYSTEM) w/Device KIT [id number] No Use as instructed to check blood sugar once daily [name], [name], FNP Taking Active   diclofenac  Sodium (VOLTAREN ) 1 % GEL [id number] No Apply 2 g topically 4 (four) times daily. [name], [name], FNP Taking Active   empagliflozin  (JARDIANCE ) 25 MG TABS tablet [id number]  Take 1 tablet (25 mg total) by mouth daily before breakfast. [name], [name], FNP  Active   gabapentin  (NEURONTIN ) 100 MG capsule [id number]  Take 1 capsule (100 mg total) by mouth daily. [name], [name], FNP  Active   Glucose Blood (BLOOD GLUCOSE TEST STRIPS) STRP [id number] No Use as instructed to monitor blood sugar up to three times daily. [name], [name], FNP Taking Active   insulin  glargine (LANTUS  SOLOSTAR) 100 UNIT/ML Solostar Pen [id number]  Inject 36 units into the skin once daily. May titrate up to 44 units daily if instructed by your provider. [name], [name], FNP  Active   Insulin  Pen Needle (PEN NEEDLES) 32G X 4 MM MISC [id number] No Use to inject Lantus  daily [name], [name], FNP Taking Active   metFORMIN  (GLUCOPHAGE -XR) 500 MG 24 hr tablet [id number]  Take 1 tablet (500 mg total) by mouth in the morning and at bedtime. [name], [name], FNP  Active            Med Note HASSIE IZETTA SAUNDERS   Mon Apr 12, 2023  2:21 PM) Taking one tablet daily  metoprolol  tartrate (LOPRESSOR ) 25 MG tablet [id number]  Take 0.5 tablets (12.5 mg total) by mouth 2 (two) times daily. [name], [name], FNP  Active   mometasone -formoterol  (DULERA ) 100-5 MCG/ACT AERO [id number]  Inhale 2 puffs into the lungs 2 (two) times daily. [name], [name], FNP  Active   pantoprazole  (PROTONIX ) 40 MG tablet [id number]  Take 1 tablet (40 mg total) by mouth at bedtime.  Patient not taking: Reported on 07/07/2023   [name], [name], FNP  Active   Phenylephrine -DM-GG  (MUCINEX  FAST-MAX CONGEST COUGH) 2.5-5-100 MG/5ML LIQD [id number] No Take 20 mLs by mouth daily as needed (cough and chest congestion). [provider] Taking Active Self  potassium chloride  SA (KLOR-CON  M20) 20 MEQ tablet [id number] No Take 1 tablet (20 mEq total) by mouth daily. [name], [name], FNP Taking Active   QUEtiapine  (SEROQUEL ) 100 MG tablet [id number]  Take 1 tablet (100 mg total) by mouth 2 (two) times daily. [name], [name], FNP  Active   Semaglutide , 1 MG/DOSE, (OZEMPIC , 1 MG/DOSE,) 4 MG/3ML SOPN [id number]  Inject 1 mg into the skin once a week. [name], [name], FNP  Active   senna-docusate (SENOKOT-S) 8.6-50 MG tablet [id number] No Take 2 tablets by mouth at bedtime as needed for moderate constipation. Caleen Burgess BROCKS, MD Taking Active   sertraline  (ZOLOFT ) 100 MG tablet [id number]  Take 1 tablet (100 mg total) by mouth daily. [name], [name], FNP  Active   torsemide  (DEMADEX ) 10 MG tablet [id number]  Take 1 tablet (10 mg total) by mouth daily. [name], [name], FNP  Active  Recommendation:   Continue Current Plan of Care  Follow Up Plan:   Telephone follow-up in 1 month  Rolin Kerns, LCSW [facility]  [facility], [facility] Clinical Social Worker Direct Dial: [phone]  Fax: [phone] Website: [email or web address] 9:33 AM

## 2023-08-05 NOTE — Patient Instructions (Signed)
 Visit Information  Timothy Le was given information about Medicaid Managed Care team care coordination services as a part of their [employer] Medicaid benefit. Timothy Le verbally consentedto engagement with the [facility] Managed Care team.   If you are experiencing a medical emergency, please call 911 or report to your local emergency department or urgent care.   If you have a non-emergency medical problem during routine business hours, please contact your provider's office and ask to speak with a nurse.   For questions related to your [employer] [facility] health plan, please call: [phone]  OR visit the member homepage at: [email or web address]  If you would like to schedule transportation through your [employer] [facility] plan, please call the following number at least 2 days in advance of your appointment: [phone]  If you are experiencing a behavioral health crisis, call the [employer] [place]  Behavioral Health Crisis Line at [phone]). The line is available 24 hours a day, seven days a week.  If you would like help to quit smoking, call 1-800-QUIT-NOW (337-865-6598) OR Espaol: 1-855-Djelo-Ya (8-144-664-6430) o para ms informacin haga clic aqu or Text READY to 799-599 to register via text   Timothy Le - following are the goals we discussed in your visit today:    Goals Addressed             This Visit's Progress    LCSW VBCI Social Work Care Plan   On track    Problems:   Needing to re-apply for Medicaid to cover supplies/medications  CSW Clinical Goal(s):   Over the next 60 days the Patient will attend all scheduled medical appointments as evidenced by patient report and care team review of appointment completion in electronic MEDICAL RECORD NUMBER  work with Child psychotherapist to address concerns related to re-applying for Medicaid.  Interventions:  Mental  Health:  Evaluation of current treatment plan related to re-applying for Medicaid to cover supplies/medications Active listening / Reflection utilized Emotional Support Provided Problem Solving /Task Center strategies reviewed Reviewed mental health medications and discussed importance of compliance: Patient is taking meds as prescribed Patient obtained a meter to check blood sugars daily Medicaid and SNAP benefits were re-certified   Patient Goals/Self-Care Activities:  Increase coping skills and healthy habits  Plan:   Telephone follow up appointment with care management team member scheduled for:  4 weeks        Please see education materials related to topics discussed provided as print materials.   The patient verbalized understanding of instructions, educational materials, and care plan provided today and DECLINED offer to receive copy of patient instructions, educational materials, and care plan.   Licensed Clinical Social Worker will 7/25  Rolin Kerns, LCSW [facility]  [facility], [facility] Clinical Social Worker Direct Dial: [phone]  Fax: [phone] Website: [email or web address] 9:34 AM   Following is a copy of your plan of care:  There are no care plans that you recently modified to display for this patient.

## 2023-08-09 NOTE — Progress Notes (Signed)
 Appt made and pt was advised [facility]

## 2023-08-13 ENCOUNTER — Other Ambulatory Visit: Payer: Self-pay | Admitting: Licensed Clinical Social Worker

## 2023-08-16 NOTE — Patient Outreach (Signed)
 Complex Care Management   Visit Note  08/13/2023  Name:  Timothy Le MRN: [id number] DOB: Apr 21, 1960  Situation: Referral received for Complex Care Management related to Mental/Behavioral Health diagnosis Depression with Anxiety I obtained verbal consent from Patient.  Visit completed with pt  on the phone  Background:   Past Medical History:  Diagnosis Date   Asthma    Chronic respiratory failure with hypoxia (HCC) 07/09/2017   COPD (chronic obstructive pulmonary disease) (HCC)    Diabetes mellitus without complication (HCC)    Difficult intubation    Hypertension    Severe obesity (BMI >= 40) (HCC)    Shortness of breath dyspnea    Sleep apnea    Tracheobronchitis     Assessment: Patient Reported Symptoms:  Cognitive Cognitive Status: No symptoms reported      Neurological Neurological Review of Symptoms: Not assessed    HEENT HEENT Symptoms Reported: Not assessed      Cardiovascular Cardiovascular Symptoms Reported: Not assessed    Respiratory Respiratory Symptoms Reported: Not assesed    Endocrine Endocrine Symptoms Reported: Not assessed    Gastrointestinal Gastrointestinal Symptoms Reported: Not assessed      Genitourinary Genitourinary Symptoms Reported: Not assessed    Integumentary Integumentary Symptoms Reported: Not assessed    Musculoskeletal Musculoskelatal Symptoms Reviewed: Not assessed        Psychosocial Psychosocial Symptoms Reported: No symptoms reported            06/23/2023    1:48 PM  Depression screen PHQ 2/9  Decreased Interest 0  Down, Depressed, Hopeless 0  PHQ - 2 Score 0  Altered sleeping 0  Tired, decreased energy 0  Change in appetite 0  Feeling bad or failure about yourself  0  Trouble concentrating 0  Moving slowly or fidgety/restless 0  Suicidal thoughts 0  PHQ-9 Score 0  Difficult doing work/chores Not difficult at all    There were no vitals filed for this visit.  Medications Reviewed Today      Reviewed by Acelynn Dejonge D, LCSW (Social Worker) on 08/16/23 at 1423  Med List Status: <None>   Medication Order Taking? Sig Documenting Provider Last Dose Status Informant  Accu-Chek Softclix Lancets lancets [id number] No Use to check blood sugar three times daily. [name], [name], FNP Taking Active            Med Note ANGELICA, Alexee Delsanto D   Tue Jun 01, 2023 10:27 AM) Pt has contacted Medicaid to see if they will cover this   albuterol  (VENTOLIN  HFA) 108 (90 Base) MCG/ACT inhaler [id number] No Inhale 2 puffs into the lungs every 6 (six) hours as needed for wheezing or shortness of breath. Neysa Rama D, MD Taking Active   allopurinol  (ZYLOPRIM ) 100 MG tablet [id number] No Take 1 tablet (100 mg total) by mouth daily. Oley Bascom GORMAN, NP Taking Active   allopurinol  (ZYLOPRIM ) 100 MG tablet [id number] No Take 1 tablet (100 mg total) by mouth daily. [name], [name], FNP Taking Active   apixaban  (ELIQUIS ) 5 MG TABS tablet [id number]  Take 1 tablet (5 mg total) by mouth 2 (two) times daily. [name], [name], FNP  Active   atorvastatin  (LIPITOR) 40 MG tablet [id number]  Take 1 tablet (40 mg total) by mouth daily. [name], [name], FNP  Active   Blood Glucose Monitoring Suppl (BLOOD GLUCOSE MONITOR SYSTEM) w/Device KIT [id number] No Use as instructed to check blood sugar once daily [name], [name], FNP Taking Active   diclofenac  Sodium (  VOLTAREN ) 1 % GEL [id number] No Apply 2 g topically 4 (four) times daily. [name], [name], FNP Taking Active   empagliflozin  (JARDIANCE ) 25 MG TABS tablet [id number]  Take 1 tablet (25 mg total) by mouth daily before breakfast. [name], [name], FNP  Active   gabapentin  (NEURONTIN ) 100 MG capsule [id number]  Take 1 capsule (100 mg total) by mouth daily. [name], [name], FNP  Active   insulin  glargine (LANTUS  SOLOSTAR) 100 UNIT/ML Solostar Pen [id number]  Inject 36 units into the skin once daily. May titrate up to 44 units daily if instructed by your  provider. [name], [name], FNP  Active   Insulin  Pen Needle (PEN NEEDLES) 32G X 4 MM MISC [id number] No Use to inject Lantus  daily [name], [name], FNP Taking Active   metFORMIN  (GLUCOPHAGE -XR) 500 MG 24 hr tablet [id number]  Take 1 tablet (500 mg total) by mouth in the morning and at bedtime. [name], [name], FNP  Active            Med Note HASSIE IZETTA SAUNDERS   Mon Apr 12, 2023  2:21 PM) Taking one tablet daily  metoprolol  tartrate (LOPRESSOR ) 25 MG tablet [id number]  Take 0.5 tablets (12.5 mg total) by mouth 2 (two) times daily. [name], [name], FNP  Active   mometasone -formoterol  (DULERA ) 100-5 MCG/ACT AERO [id number]  Inhale 2 puffs into the lungs 2 (two) times daily. [name], [name], FNP  Active   pantoprazole  (PROTONIX ) 40 MG tablet [id number]  Take 1 tablet (40 mg total) by mouth at bedtime.  Patient not taking: Reported on 07/07/2023   [name], [name], FNP  Expired 08/11/23 2359   Phenylephrine -DM-GG (MUCINEX  FAST-MAX CONGEST COUGH) 2.5-5-100 MG/5ML LIQD [id number] No Take 20 mLs by mouth daily as needed (cough and chest congestion). [provider] Taking Active Self  potassium chloride  SA (KLOR-CON  M20) 20 MEQ tablet [id number] No Take 1 tablet (20 mEq total) by mouth daily. [name], [name], FNP Taking Active   QUEtiapine  (SEROQUEL ) 100 MG tablet [id number]  Take 1 tablet (100 mg total) by mouth 2 (two) times daily. [name], [name], FNP  Active   Semaglutide , 1 MG/DOSE, (OZEMPIC , 1 MG/DOSE,) 4 MG/3ML SOPN [id number]  Inject 1 mg into the skin once a week. [name], [name], FNP  Active   senna-docusate (SENOKOT-S) 8.6-50 MG tablet [id number] No Take 2 tablets by mouth at bedtime as needed for moderate constipation. Caleen Burgess BROCKS, MD Taking Active   sertraline  (ZOLOFT ) 100 MG tablet [id number]  Take 1 tablet (100 mg total) by mouth daily. [name], [name], FNP  Active   torsemide  (DEMADEX ) 10 MG tablet [id number]  Take 1 tablet (10 mg total) by mouth daily.  [name], [name], FNP  Active             Recommendation:   PCP Follow-up Continue Current Plan of Care  Follow Up Plan:   Telephone follow-up 2-4 weeks  Rolin Kerns, LCSW [facility]  [facility], [facility] Clinical Social Worker Direct Dial: [phone]  Fax: [phone] Website: [email or web address] 2:28 PM

## 2023-08-16 NOTE — Patient Instructions (Signed)
 Visit Information  Mr. Zacharia was given information about Medicaid Managed Care team care coordination services as a part of their [employer] Medicaid benefit. Alm GORMAN Pleas verbally consentedto engagement with the [facility] Managed Care team.   If you are experiencing a medical emergency, please call 911 or report to your local emergency department or urgent care.   If you have a non-emergency medical problem during routine business hours, please contact your provider's office and ask to speak with a nurse.   For questions related to your [employer] [facility] health plan, please call: [phone]  OR visit the member homepage at: [email or web address]  If you would like to schedule transportation through your [facility] [facility] plan, please call the following number at least 2 days in advance of your appointment: [phone]  If you are experiencing a behavioral health crisis, call the [employer] [place]  Behavioral Health Crisis Line at [phone]). The line is available 24 hours a day, seven days a week.  If you would like help to quit smoking, call 1-800-QUIT-NOW (825-312-2071) OR Espaol: 1-855-Djelo-Ya (8-144-664-6430) o para ms informacin haga clic aqu or Text READY to 799-599 to register via text   Mr. Malinak - following are the goals we discussed in your visit today:    Goals Addressed             This Visit's Progress    LCSW VBCI Social Work Care Plan   On track    Problems:   Needing to re-apply for Medicaid to cover supplies/medications  CSW Clinical Goal(s):   Over the next 60 days the Patient will attend all scheduled medical appointments as evidenced by patient report and care team review of appointment completion in electronic MEDICAL RECORD NUMBER  work with Child psychotherapist to address concerns related to re-applying for Medicaid.  Interventions:  Mental  Health:  Evaluation of current treatment plan related to re-applying for Medicaid to cover supplies/medications Active listening / Reflection utilized Emotional Support Provided Problem Solving /Task Center strategies reviewed Reviewed mental health medications and discussed importance of compliance: Patient is taking meds as prescribed Patient obtained a meter to check blood sugars daily Medicaid and SNAP benefits were re-certified  LCSW collaborated with PCP office to inquire about contact # for [employer]. Pt reports that he has not obtained info regarding referral or appt details  Patient Goals/Self-Care Activities:  Increase coping skills and healthy habits  F/up with PCP office to inquire about referral/appt to [facility]  Plan:   Telephone follow up appointment with care management team member scheduled for:  2-4 weeks        Please see education materials related to topics discussed provided by MyChart link.  The patient verbalized understanding of instructions, educational materials, and care plan provided today and DECLINED offer to receive copy of patient instructions, educational materials, and care plan.   Licensed Clinical Social Worker will 08/19 at 2 PM  Rolin Kerns, LCSW [place]  [facility], [facility] Clinical Social Worker Direct Dial: [phone]  Fax: [phone] Website: [email or web address] 2:29 PM   Following is a copy of your plan of care:  There are no care plans that you recently modified to display for this patient.

## 2023-08-23 ENCOUNTER — Other Ambulatory Visit (HOSPITAL_COMMUNITY): Payer: Self-pay

## 2023-08-23 ENCOUNTER — Other Ambulatory Visit: Payer: Self-pay

## 2023-08-23 DIAGNOSIS — E1142 Type 2 diabetes mellitus with diabetic polyneuropathy: Secondary | ICD-10-CM

## 2023-08-23 MED ORDER — LANTUS SOLOSTAR 100 UNIT/ML ~~LOC~~ SOPN
PEN_INJECTOR | SUBCUTANEOUS | 3 refills | Status: DC
  Filled 2023-08-23: qty 36, 81d supply, fill #0

## 2023-08-23 NOTE — Progress Notes (Signed)
 08/23/2023 Name: Timothy Le MRN: [id number] DOB: September 13, 1960  Chief Complaint  Patient presents with   Diabetes    Timothy Le is a 63 y.o. year old male who presented for a telephone visit. PMH includes HFpEF, severe OSA, HTN, COPD, chronic respiratory failure with tracheostomy (followed at [facility] tracheostomy clinic), T2DM, BMI > 50.   They were referred to the pharmacist by their PCP for assistance in managing diabetes.   Subjective: Patient was last seen by PCP, Timothy Shall, NP on 06/23/23. A1C had decreased from 6.5% to 6.1%. He reported injected 44 units of Lantus  daily along with Ozempic  0.5 mg weekly, metformin  500 mg BID, and Jardiance  25 mg daily. He was instructed to reduce Lantus  to 36 units daily to reduce risk of hypoglycemia. His Medicaid insurance still had not been updated from a long-term care plan and glucometer + supplies could not be filled. He was last engaged by pharmacy via telephone on 07/07/23. At this time, he had been enrolled in a standard Medicaid plan. We renewed his PA for Jardiance  and Ozempic . He was instructed to increase Ozempic  to 1 mg weekly and decrease Lantus  to 28 units daily. His BG meter and supplies were also shipped via [facility] pharmacy.   Today, patient reports doing well. He confirms that he is taking Ozempic  1 mg weekly. However, he reports that he continues to inject Lantus  36 units daily, instead of 26 units. Despite this, he has had no episodes of hypoglycemia. He has been monitoring his BG with his new Accu Chek Guide meter.   Care Team: Primary Care Provider: [name], [name], FNP ; Next Scheduled Visit: 08/24/23 Pulmonology: Reggy Salt, MD: Next Scheduled Visit: 06/29/23 - did not show, needs to be rescheduled  Medication Access/Adherence  Current Pharmacy:  [facility] - [facility] [address] [place] [address] Phone: [phone] Fax: [phone]  [id number] - [place], [place] - [address] AT [facility] [address] [place] [address] Phone: [phone] Fax: [phone]   Patient reports affordability concerns with their medications: No  Patient reports access/transportation concerns to their pharmacy: No  Patient reports adherence concerns with their medications:  No  - Reports it is time for a refill on his Ozempic   Reports that he got a Medicaid insurance card in the mail. Reports that he set up his card to get $50/mo to buy fresh fruit, vegetables, meat. His niece is using this for him, and she is choosing healthy option.   Diabetes:  Current medications: Ozempic  1 mg weekly (Saturdays), Jardiance  25 mg daily, metformin  XR 500 mg BID (taking once daily), Lantus  28 units daily (reports that he needs a refill soon, reports that he is taking 36 units daily)  He denies GI AE on current dose of Ozempic . Continues to report appetite suppression. His snacking has decreased.  He has been stopping eating when he feels full. He reports he can eat a maximum of 2 meals per day, but that sometimes these are larger meals.  Current BG readings: Using Accu Chek Guide meter, checking once daily: 110, 98, 111. Reports that his highest BG was 235 mg/dL after eating pizza and brownies the night before. Denies BG less 90 mg/dL.  He reports that he is not interested in CGM at this time. He has a flip phone, and does not want to use a reader to check the values.   Patient denies hypoglycemic  s/sx including dizziness, shakiness.   Patient denies hyperglycemic symptoms including polyuria, polydipsia, polyphagia, nocturia, neuropathy, blurred vision. He does report that he sees some black spots in his vision, but this is usual for him - he is due for ophthalmology appt.   Current meal patterns: 2 meals/day - Breakfast: sausage, eggs. Yesterday breakfast was biscuit, home fries, and a piece of meat - and he did not get hungry again all day. -  Supper: [place] burger, salad - Snacks: He reports that he is no longer buying junk food snacks. - Drinks: water , fruit juice once a day - minutemade, coffee with cream and splenda  Physical activity: Reports that he has been walking more - to the garbage can, mail box.   Hyperlipidemia/ASCVD Risk Reduction  Current lipid lowering medications: atorvastatin  40 mg daily  The 10-year ASCVD risk score (Arnett DK, et al., 2019) is: 25.4%   Values used to calculate the score:     Age: 6 years     Clincally relevant sex: Male     Is Non-Hispanic African American: Yes     Diabetic: Yes     Tobacco smoker: No     Systolic Blood Pressure: 134 mmHg     Is BP treated: Yes     HDL Cholesterol: 45 mg/dL     Total Cholesterol: 145 mg/dL   Objective:  BP Readings from Last 3 Encounters:  06/23/23 134/63  03/09/23 124/82  12/28/22 125/62    Lab Results  Component Value Date   HGBA1C 6.1 (A) 06/23/2023   UACR 06/23/23: 11 mg/g (prev 34 mg/g)  Lab Results  Component Value Date   CREATININE 1.10 06/23/2023   BUN 17 06/23/2023   NA 146 (H) 06/23/2023   K 3.8 06/23/2023   CL 102 06/23/2023   CO2 25 06/23/2023    Lab Results  Component Value Date   CHOL 145 03/08/2023   HDL 45 03/08/2023   LDLCALC 82 03/08/2023   LDLDIRECT 65 06/23/2023   TRIG 96 03/08/2023   CHOLHDL 3.2 03/08/2023    Medications Reviewed Today     Reviewed by Brinda Lorain SQUIBB, RPH (Pharmacist) on 08/23/23 at 1137  Med List Status: <None>   Medication Order Taking? Sig Documenting Provider Last Dose Status Informant  Accu-Chek Softclix Lancets lancets [id number]  Use to check blood sugar three times daily. [name], [name], FNP  Active            Med Note ANGELICA, JASMINE D   Tue Jun 01, 2023 10:27 AM) Pt has contacted Medicaid to see if they will cover this   albuterol  (VENTOLIN  HFA) 108 (90 Base) MCG/ACT inhaler [id number]  Inhale 2 puffs into the lungs every 6 (six) hours as needed for wheezing or shortness of  breath. Neysa Rama D, MD  Active   allopurinol  (ZYLOPRIM ) 100 MG tablet [id number]  Take 1 tablet (100 mg total) by mouth daily. Oley Bascom RAMAN, NP  Active   allopurinol  (ZYLOPRIM ) 100 MG tablet [id number]  Take 1 tablet (100 mg total) by mouth daily. [name], [name], FNP  Active   apixaban  (ELIQUIS ) 5 MG TABS tablet [id number] Yes Take 1 tablet (5 mg total) by mouth 2 (two) times daily. [name], [name], FNP  Active   atorvastatin  (LIPITOR) 40 MG tablet [id number] Yes Take 1 tablet (40 mg total) by mouth daily. [name], [name], FNP  Active   Blood Glucose Monitoring Suppl (BLOOD GLUCOSE MONITOR SYSTEM) w/Device KIT [id number]  Use as instructed to  check blood sugar once daily [name], [name], FNP  Active   diclofenac  Sodium (VOLTAREN ) 1 % GEL [id number]  Apply 2 g topically 4 (four) times daily. [name], [name], FNP  Active   empagliflozin  (JARDIANCE ) 25 MG TABS tablet [id number] Yes Take 1 tablet (25 mg total) by mouth daily before breakfast. [name], [name], FNP  Active   gabapentin  (NEURONTIN ) 100 MG capsule [id number] Yes Take 1 capsule (100 mg total) by mouth daily. [name], [name], FNP  Active   insulin  glargine (LANTUS  SOLOSTAR) 100 UNIT/ML Solostar Pen [id number] Yes Inject 36 units into the skin once daily. May titrate up to 44 units daily if instructed by your provider. [name], [name], FNP  Active   Insulin  Pen Needle (PEN NEEDLES) 32G X 4 MM MISC [id number]  Use to inject Lantus  daily [name], [name], FNP  Active   metFORMIN  (GLUCOPHAGE -XR) 500 MG 24 hr tablet [id number] Yes Take 1 tablet (500 mg total) by mouth in the morning and at bedtime. [name], [name], FNP  Active            Med Note HASSIE IZETTA SAUNDERS   Mon Apr 12, 2023  2:21 PM) Taking one tablet daily  metoprolol  tartrate (LOPRESSOR ) 25 MG tablet [id number] Yes Take 0.5 tablets (12.5 mg total) by mouth 2 (two) times daily. [name], [name], FNP  Active   mometasone -formoterol  (DULERA ) 100-5  MCG/ACT AERO [id number]  Inhale 2 puffs into the lungs 2 (two) times daily. [name], [name], FNP  Active    Patient not taking:   Discontinued 08/23/23 1137 (Patient Preference)   Phenylephrine -DM-GG (MUCINEX  FAST-MAX CONGEST COUGH) 2.5-5-100 MG/5ML LIQD [id number]  Take 20 mLs by mouth daily as needed (cough and chest congestion). [provider]  Active Self    Discontinued 08/23/23 1137 (Patient has not taken in last 30 days)   QUEtiapine  (SEROQUEL ) 100 MG tablet [id number] Yes Take 1 tablet (100 mg total) by mouth 2 (two) times daily. [name], [name], FNP  Active   Semaglutide , 1 MG/DOSE, (OZEMPIC , 1 MG/DOSE,) 4 MG/3ML SOPN [id number] Yes Inject 1 mg into the skin once a week. [name], [name], FNP  Active   senna-docusate (SENOKOT-S) 8.6-50 MG tablet [id number]  Take 2 tablets by mouth at bedtime as needed for moderate constipation. Caleen Burgess BROCKS, MD  Active   sertraline  (ZOLOFT ) 100 MG tablet [id number] Yes Take 1 tablet (100 mg total) by mouth daily. [name], [name], FNP  Active   torsemide  (DEMADEX ) 10 MG tablet [id number] Yes Take 1 tablet (10 mg total) by mouth daily. [name], [name], FNP  Active              Assessment/Plan:     Diabetes: - Currently controlled based on last A1c 6.1%, below goal <7%. Patient is a great candidate for GLP-1 given BMI >50. He is tolerating Ozempic  1 mg well and has a current supply to last him until September. He is not having any hypoglycemia at his current dose of Lantus , so it is appropriate to continue current regimen.  - Reviewed long term cardiovascular and renal outcomes of uncontrolled blood sugar - Reviewed goal A1c, goal fasting, and goal 2 hour post prandial glucose - Reviewed dietary modifications including increasing intake of protein and non-starchy vegetables. Limit portion sizes of carbohydrate foods. Also discussed replacing sugary beverages with zero sugar options.  - Recommend to continue Ozempic  to 1 mg  weekly.  - Recommend to continue Lantus  36 units daily. Facilitated  refill as requested by patient. - Recommend to continue Jardiance  25 mg daily - Recommend to continue metformin  XR 500  mg BID. Encouraged to take twice daily. - Recommend to check fasting blood glucose daily and occasional 2-hr PPG - Most recent UACR WNL - Next A1C due 09/24/23   Hyperlipidemia/ASCVD Risk Reduction:  Currently controlled with last direct LDL of 65 mg/dL below goal <29 mg/dL given dx of DM. Appropriate to continue high intensity statin. - Reviewed long term complications of uncontrolled cholesterol - Reviewed dietary recommendations including cutting back saturated fats and excess carbohydrates - Recommend to continue atorvastatin  40 mg daily   Follow Up Plan: PCP 08/24/23, PharmD telephone 10/08/23  Lorain Baseman, PharmD [facility] [phone]

## 2023-08-24 ENCOUNTER — Ambulatory Visit (INDEPENDENT_AMBULATORY_CARE_PROVIDER_SITE_OTHER): Payer: Self-pay | Admitting: Nurse Practitioner

## 2023-08-24 ENCOUNTER — Encounter: Payer: Self-pay | Admitting: Nurse Practitioner

## 2023-08-24 ENCOUNTER — Other Ambulatory Visit: Payer: Self-pay

## 2023-08-24 DIAGNOSIS — M109 Gout, unspecified: Secondary | ICD-10-CM

## 2023-08-24 DIAGNOSIS — G8929 Other chronic pain: Secondary | ICD-10-CM

## 2023-08-24 DIAGNOSIS — M25562 Pain in left knee: Secondary | ICD-10-CM

## 2023-08-25 NOTE — Progress Notes (Signed)
Visit not needed

## 2023-09-07 ENCOUNTER — Other Ambulatory Visit: Payer: Self-pay | Admitting: Licensed Clinical Social Worker

## 2023-09-07 NOTE — Patient Instructions (Signed)
 Visit Information  Timothy Le was given information about Medicaid Managed Care team care coordination services as a part of their [employer] Medicaid benefit.   If you would like to schedule transportation through your [employer] [facility] plan, please call the following number at least 2 days in advance of your appointment: [phone]  If you are experiencing a behavioral health crisis, call the [employer] [place]  Behavioral Health Crisis Line at [phone]). The line is available 24 hours a day, seven days a week.   Timothy Le - following are the goals we discussed in your visit today:    Goals Addressed             This Visit's Progress    LCSW VBCI Social Work Care Plan   On track    Problems:   Needing to re-apply for Medicaid to cover supplies/medications  CSW Clinical Goal(s):   Over the next 60 days the Patient will attend all scheduled medical appointments as evidenced by patient report and care team review of appointment completion in electronic MEDICAL RECORD NUMBER  work with Child psychotherapist to address concerns related to re-applying for Medicaid.  Interventions:  Mental Health:  Evaluation of current treatment plan related to re-applying for Medicaid to cover supplies/medications Active listening / Reflection utilized Emotional Support Provided Problem Solving /Task Center strategies reviewed Reviewed mental health medications and discussed importance of compliance: Patient is taking meds as prescribed Patient obtained a meter to check blood sugars daily Medicaid and SNAP benefits were re-certified. Has Blue Card to assist with utilities/food monthly   Patient Goals/Self-Care Activities:  Increase coping skills and healthy habits   Plan:   Telephone follow up appointment with care management team member scheduled for:  4 weeks        Please see education materials related to topics discussed  provided as print materials.   The patient verbalized understanding of instructions, educational materials, and care plan provided today and DECLINED offer to receive copy of patient instructions, educational materials, and care plan.   Licensed Clinical Social Worker will call on 09/16 at 2 PM  Rolin Kerns, LCSW [place]  [facility], [facility] Clinical Social Worker Direct Dial: [phone]  Fax: [phone] Website: [email or web address] 2:23 PM   Following is a copy of your plan of care:  There are no care plans that you recently modified to display for this patient.

## 2023-09-07 NOTE — Patient Outreach (Signed)
 Complex Care Management   Visit Note  09/07/2023  Name:  Timothy Le MRN: [id number] DOB: 04-24-1960  Situation: Referral received for Complex Care Management related to Mental/Behavioral Health diagnosis Depression and Anxiety I obtained verbal consent from Patient.  Visit completed with Patient  on the phone  Background:   Past Medical History:  Diagnosis Date   Asthma    Chronic respiratory failure with hypoxia (HCC) 07/09/2017   COPD (chronic obstructive pulmonary disease) (HCC)    Diabetes mellitus without complication (HCC)    Difficult intubation    Hypertension    Severe obesity (BMI >= 40) (HCC)    Shortness of breath dyspnea    Sleep apnea    Tracheobronchitis     Assessment: Patient Reported Symptoms:  Cognitive Cognitive Status: No symptoms reported, Alert and oriented to person, place, and time, Normal speech and language skills Cognitive/Intellectual Conditions Management [RPT]: None reported or documented in medical history or problem list   Health Maintenance Behaviors: Annual physical exam  Neurological Neurological Review of Symptoms: No symptoms reported    HEENT HEENT Symptoms Reported: No symptoms reported      Cardiovascular Cardiovascular Symptoms Reported: No symptoms reported    Respiratory Respiratory Symptoms Reported: No symptoms reported    Endocrine Endocrine Symptoms Reported: No symptoms reported Is patient diabetic?: Yes Is patient checking blood sugars at home?: Yes List most recent blood sugar readings, include date and time of day: 110 on 8/19 at 9:34 AM    Gastrointestinal Gastrointestinal Symptoms Reported: Flatulence Additional Gastrointestinal Details: Pt reports he has a sour stomache, burp smells like rotten eggs. Will call pharmacy regarding medication to assist with symptom management Gastrointestinal Management Strategies: Medication therapy    Genitourinary Genitourinary Symptoms Reported: No symptoms reported     Integumentary Integumentary Symptoms Reported: No symptoms reported    Musculoskeletal Musculoskelatal Symptoms Reviewed: No symptoms reported        Psychosocial Psychosocial Symptoms Reported: No symptoms reported Behavioral Management Strategies: Adequate rest, Coping strategies, Support system Major Change/Loss/Stressor/Fears (CP): Medical condition, self Quality of Family Relationships: helpful, involved, supportive Do you feel physically threatened by others?: No    09/07/2023    PHQ2-9 Depression Screening   Little interest or pleasure in doing things    Feeling down, depressed, or hopeless    PHQ-2 - Total Score    Trouble falling or staying asleep, or sleeping too much    Feeling tired or having little energy    Poor appetite or overeating     Feeling bad about yourself - or that you are a failure or have let yourself or your family down    Trouble concentrating on things, such as reading the newspaper or watching television    Moving or speaking so slowly that other people could have noticed.  Or the opposite - being so fidgety or restless that you have been moving around a lot more than usual    Thoughts that you would be better off dead, or hurting yourself in some way    PHQ2-9 Total Score    If you checked off any problems, how difficult have these problems made it for you to do your work, take care of things at home, or get along with other people    Depression Interventions/Treatment      There were no vitals filed for this visit.  Medications Reviewed Today     Reviewed by Ezzard Rolin BIRCH, LCSW (Social Worker) on 09/07/23 at 1411  Med List  Status: <None>   Medication Order Taking? Sig Documenting Provider Last Dose Status Informant  Accu-Chek Softclix Lancets lancets [id number]  Use to check blood sugar three times daily. [name], [name], FNP  Active            Med Note ANGELICA, Takoya Jonas D   Tue Jun 01, 2023 10:27 AM) Pt has contacted Medicaid to see if they  will cover this   albuterol  (VENTOLIN  HFA) 108 (90 Base) MCG/ACT inhaler [id number]  Inhale 2 puffs into the lungs every 6 (six) hours as needed for wheezing or shortness of breath. Neysa Rama D, MD  Active   allopurinol  (ZYLOPRIM ) 100 MG tablet [id number] No Take 1 tablet (100 mg total) by mouth daily. Oley Bascom RAMAN, NP Taking Active   allopurinol  (ZYLOPRIM ) 100 MG tablet [id number] No Take 1 tablet (100 mg total) by mouth daily. [name], [name], FNP Taking Active   apixaban  (ELIQUIS ) 5 MG TABS tablet [id number]  Take 1 tablet (5 mg total) by mouth 2 (two) times daily. [name], [name], FNP  Active   atorvastatin  (LIPITOR) 40 MG tablet [id number]  Take 1 tablet (40 mg total) by mouth daily. [name], [name], FNP  Active   Blood Glucose Monitoring Suppl (BLOOD GLUCOSE MONITOR SYSTEM) w/Device KIT [id number]  Use as instructed to check blood sugar once daily [name], [name], FNP  Active   diclofenac  Sodium (VOLTAREN ) 1 % GEL [id number] No Apply 2 g topically 4 (four) times daily. [name], [name], FNP Taking Active   empagliflozin  (JARDIANCE ) 25 MG TABS tablet [id number]  Take 1 tablet (25 mg total) by mouth daily before breakfast. [name], [name], FNP  Active   gabapentin  (NEURONTIN ) 100 MG capsule [id number]  Take 1 capsule (100 mg total) by mouth daily. [name], [name], FNP  Active   insulin  glargine (LANTUS  SOLOSTAR) 100 UNIT/ML Solostar Pen [id number]  Inject 36 units into the skin once daily. May titrate up to 44 units daily if instructed by your provider. [name], [name], FNP  Active   Insulin  Pen Needle (PEN NEEDLES) 32G X 4 MM MISC [id number]  Use to inject Lantus  daily [name], [name], FNP  Active   metFORMIN  (GLUCOPHAGE -XR) 500 MG 24 hr tablet [id number]  Take 1 tablet (500 mg total) by mouth in the morning and at bedtime. [name], [name], FNP  Active            Med Note HASSIE IZETTA SAUNDERS   Mon Apr 12, 2023  2:21 PM) Taking one tablet daily  metoprolol   tartrate (LOPRESSOR ) 25 MG tablet [id number]  Take 0.5 tablets (12.5 mg total) by mouth 2 (two) times daily. [name], [name], FNP  Active   mometasone -formoterol  (DULERA ) 100-5 MCG/ACT AERO [id number]  Inhale 2 puffs into the lungs 2 (two) times daily. [name], [name], FNP  Active   Phenylephrine -DM-GG (MUCINEX  FAST-MAX CONGEST COUGH) 2.5-5-100 MG/5ML LIQD [id number]  Take 20 mLs by mouth daily as needed (cough and chest congestion). [provider]  Active Self  QUEtiapine  (SEROQUEL ) 100 MG tablet [id number]  Take 1 tablet (100 mg total) by mouth 2 (two) times daily. [name], [name], FNP  Active   Semaglutide , 1 MG/DOSE, (OZEMPIC , 1 MG/DOSE,) 4 MG/3ML SOPN [id number]  Inject 1 mg into the skin once a week. [name], [name], FNP  Active   senna-docusate (SENOKOT-S) 8.6-50 MG tablet [id number]  Take 2 tablets by mouth at bedtime as needed for moderate constipation.  Patient not taking: Reported on 08/24/2023  Caleen Burgess BROCKS, MD  Active   sertraline  (ZOLOFT ) 100 MG tablet [id number]  Take 1 tablet (100 mg total) by mouth daily. [name], [name], FNP  Active   torsemide  (DEMADEX ) 10 MG tablet [id number]  Take 1 tablet (10 mg total) by mouth daily. [name], [name], FNP  Active             Recommendation:   Specialty provider follow-up at [facility] 09/08/23  Continue Current Plan of Care  Follow Up Plan:   Telephone follow-up in 1 month  Rolin Kerns, LCSW [facility]  [facility], [facility] Clinical Social Worker Direct Dial: [phone]  Fax: [phone] Website: [email or web address] 2:21 PM

## 2023-09-08 ENCOUNTER — Ambulatory Visit (HOSPITAL_COMMUNITY)
Admission: RE | Admit: 2023-09-08 | Discharge: 2023-09-08 | Disposition: A | Discharge status: Home / Self Care | Source: Ambulatory Visit | Attending: Nurse Practitioner | Admitting: Nurse Practitioner

## 2023-09-08 DIAGNOSIS — J9610 Chronic respiratory failure, unspecified whether with hypoxia or hypercapnia: Secondary | ICD-10-CM | POA: Insufficient documentation

## 2023-09-08 DIAGNOSIS — Z930 Tracheostomy status: Secondary | ICD-10-CM | POA: Diagnosis not present

## 2023-09-08 DIAGNOSIS — G4733 Obstructive sleep apnea (adult) (pediatric): Secondary | ICD-10-CM | POA: Insufficient documentation

## 2023-09-08 NOTE — Progress Notes (Signed)
 Tracheostomy Procedure Note  Timothy Le [id number] October 19, 1960  Pre Procedure Tracheostomy Information  Trach Brand: Shiley Size: 4.0  [id number] Style: Uncuffed Secured by: Velcro   Procedure: Trach Change and Trach Cleaning    Post Procedure Tracheostomy Information  Trach Brand: Shiley Size: 4.0 [id number] Style: Uncuffed Secured by: Velcro   Post Procedure Evaluation:  ETCO2 positive color change from yellow to purple : Yes.   Vital signs:VSS Patients current condition: stable Complications: No apparent complications Trach site exam: clean, dry Wound care done: 4 x 4 gauze drain Patient did tolerate procedure well.   Education: none  Prescription needs: none    Additional needs: New PMV given, 1 box of inner cannulas 4IC65 and 1 new trach 4UN65R given to patient at this visit

## 2023-09-08 NOTE — Written Directive (Addendum)
 Tracheostomy Home Health Order Sheet  [facility]: [facility] Respiratory Care Department [address]  [place] [address]  [phone]  Fax [phone]  (Fax order requests to this number)   RISHAV ROCKEFELLER  1960-11-27  [id number]   Diagnosis code:  Code: Z93.0, : J96.10, G47.33  Please order the following    size 4 and uncuffed  Item # [id number] quantity #1, refill 12  Backup trach   NA  Supplies:     Trach tube collar. Quantity: # 4 (or maximum allowed per month) refills # 12  Suction catheters. size:58fr  Quantity # 30 refills: 12  HME device. Quantity # 30 or (maximum allowed per month)refills 12  (Reference # [id number])  Passy Muir Valve (speaking valve) Quantity 1 refills 12   Indication: tracheostomy dependence    Additional supplies    Medication instructions.      Maude Banner ACNP NPI# [id number] [facility]  [address] [place], [place] [address]   @TODAY @   @SIGNATUREIMG @    _______________________________________________.

## 2023-09-13 ENCOUNTER — Other Ambulatory Visit: Payer: Self-pay | Admitting: Nurse Practitioner

## 2023-09-13 ENCOUNTER — Other Ambulatory Visit: Payer: Self-pay

## 2023-09-13 ENCOUNTER — Other Ambulatory Visit (HOSPITAL_COMMUNITY): Payer: Self-pay

## 2023-09-13 DIAGNOSIS — F418 Other specified anxiety disorders: Secondary | ICD-10-CM

## 2023-09-13 DIAGNOSIS — I5032 Chronic diastolic (congestive) heart failure: Secondary | ICD-10-CM

## 2023-09-13 MED ORDER — SERTRALINE HCL 100 MG PO TABS
100.0000 mg | ORAL_TABLET | Freq: Every day | ORAL | 0 refills | Status: AC
  Filled 2023-09-13: qty 90, 90d supply, fill #0

## 2023-09-13 MED ORDER — APIXABAN 5 MG PO TABS
5.0000 mg | ORAL_TABLET | Freq: Two times a day (BID) | ORAL | 1 refills | Status: AC
  Filled 2023-09-13: qty 180, 90d supply, fill #0

## 2023-09-13 MED ORDER — QUETIAPINE FUMARATE 100 MG PO TABS
100.0000 mg | ORAL_TABLET | Freq: Two times a day (BID) | ORAL | 1 refills | Status: AC
  Filled 2023-09-13: qty 180, 90d supply, fill #0

## 2023-09-13 MED ORDER — METOPROLOL TARTRATE 25 MG PO TABS
12.5000 mg | ORAL_TABLET | Freq: Two times a day (BID) | ORAL | 1 refills | Status: DC
  Filled 2023-09-13: qty 90, 90d supply, fill #0
  Filled 2024-01-07: qty 90, 90d supply, fill #1

## 2023-09-13 MED ORDER — TORSEMIDE 10 MG PO TABS
10.0000 mg | ORAL_TABLET | Freq: Every day | ORAL | 1 refills | Status: DC
  Filled 2023-09-13: qty 90, 90d supply, fill #0
  Filled 2023-12-09: qty 90, 90d supply, fill #1

## 2023-09-14 ENCOUNTER — Other Ambulatory Visit (HOSPITAL_COMMUNITY): Payer: Self-pay

## 2023-09-15 ENCOUNTER — Other Ambulatory Visit (HOSPITAL_COMMUNITY): Payer: Self-pay

## 2023-09-15 ENCOUNTER — Encounter (HOSPITAL_COMMUNITY): Payer: Self-pay

## 2023-09-24 ENCOUNTER — Ambulatory Visit (INDEPENDENT_AMBULATORY_CARE_PROVIDER_SITE_OTHER): Payer: Self-pay | Admitting: Nurse Practitioner

## 2023-09-24 ENCOUNTER — Other Ambulatory Visit (HOSPITAL_COMMUNITY): Payer: Self-pay

## 2023-09-24 ENCOUNTER — Encounter: Payer: Self-pay | Admitting: Nurse Practitioner

## 2023-09-24 ENCOUNTER — Other Ambulatory Visit: Payer: Self-pay

## 2023-09-24 VITALS — BP 123/54 | HR 59 | Wt 333.0 lb

## 2023-09-24 DIAGNOSIS — E1142 Type 2 diabetes mellitus with diabetic polyneuropathy: Secondary | ICD-10-CM | POA: Diagnosis not present

## 2023-09-24 DIAGNOSIS — Z794 Long term (current) use of insulin: Secondary | ICD-10-CM

## 2023-09-24 DIAGNOSIS — Z0000 Encounter for general adult medical examination without abnormal findings: Secondary | ICD-10-CM | POA: Diagnosis not present

## 2023-09-24 DIAGNOSIS — M25562 Pain in left knee: Secondary | ICD-10-CM

## 2023-09-24 DIAGNOSIS — I5032 Chronic diastolic (congestive) heart failure: Secondary | ICD-10-CM

## 2023-09-24 DIAGNOSIS — M109 Gout, unspecified: Secondary | ICD-10-CM | POA: Diagnosis not present

## 2023-09-24 DIAGNOSIS — H547 Unspecified visual loss: Secondary | ICD-10-CM | POA: Insufficient documentation

## 2023-09-24 DIAGNOSIS — Z125 Encounter for screening for malignant neoplasm of prostate: Secondary | ICD-10-CM | POA: Diagnosis not present

## 2023-09-24 DIAGNOSIS — G8929 Other chronic pain: Secondary | ICD-10-CM | POA: Diagnosis not present

## 2023-09-24 DIAGNOSIS — H6121 Impacted cerumen, right ear: Secondary | ICD-10-CM | POA: Insufficient documentation

## 2023-09-24 DIAGNOSIS — I82492 Acute embolism and thrombosis of other specified deep vein of left lower extremity: Secondary | ICD-10-CM | POA: Diagnosis not present

## 2023-09-24 DIAGNOSIS — Z5941 Food insecurity: Secondary | ICD-10-CM | POA: Insufficient documentation

## 2023-09-24 DIAGNOSIS — J439 Emphysema, unspecified: Secondary | ICD-10-CM

## 2023-09-24 LAB — POCT GLYCOSYLATED HEMOGLOBIN (HGB A1C): Hemoglobin A1C: 6.1 % — AB (ref 4.0–5.6)

## 2023-09-24 MED ORDER — PEN NEEDLES 32G X 4 MM MISC
3 refills | Status: AC
Start: 2023-09-24 — End: ?
  Filled 2023-09-24: qty 100, 100d supply, fill #0

## 2023-09-24 MED ORDER — ALLOPURINOL 100 MG PO TABS
100.0000 mg | ORAL_TABLET | Freq: Every day | ORAL | 1 refills | Status: AC
  Filled 2023-09-24: qty 90, 90d supply, fill #0
  Filled 2023-12-09: qty 90, 90d supply, fill #1

## 2023-09-24 MED ORDER — DULERA 100-5 MCG/ACT IN AERO
2.0000 | INHALATION_SPRAY | Freq: Two times a day (BID) | RESPIRATORY_TRACT | 2 refills | Status: AC
  Filled 2023-09-24: qty 13, 30d supply, fill #0

## 2023-09-24 MED ORDER — OZEMPIC (1 MG/DOSE) 4 MG/3ML ~~LOC~~ SOPN
1.0000 mg | PEN_INJECTOR | SUBCUTANEOUS | 3 refills | Status: DC
  Filled 2023-09-24: qty 9, 84d supply, fill #0
  Filled 2024-01-07: qty 9, 84d supply, fill #1

## 2023-09-24 MED ORDER — DICLOFENAC SODIUM 1 % EX GEL
2.0000 g | Freq: Four times a day (QID) | CUTANEOUS | 5 refills | Status: AC
  Filled 2023-09-24: qty 100, 13d supply, fill #0

## 2023-09-24 MED ORDER — METFORMIN HCL ER 500 MG PO TB24
500.0000 mg | ORAL_TABLET | Freq: Two times a day (BID) | ORAL | 3 refills | Status: AC
  Filled 2023-09-24: qty 180, 90d supply, fill #0
  Filled 2023-12-09: qty 180, 90d supply, fill #1

## 2023-09-24 NOTE — Assessment & Plan Note (Signed)
 Gets food stamps Food from the clinic pantry given

## 2023-09-24 NOTE — Patient Instructions (Addendum)
 Eye doctor  [address] [place] [address]  P:  [phone]    Please consider getting Shingrix and Tdap , pneumovax 20 vaccine at local pharmacy.    It is important that you exercise regularly at least 30 minutes 5 times a week as tolerated  Think about what you will eat, plan ahead. Choose  clean, green, fresh or frozen over canned, processed or packaged foods which are more sugary, salty and fatty. 70 to 75% of food eaten should be vegetables and fruit. Three meals at set times with snacks allowed between meals, but they must be fruit or vegetables. Aim to eat over a 12 hour period , example 7 am to 7 pm, and STOP after  your last meal of the day. Drink water ,generally about 64 ounces per day, no other drink is as healthy. Fruit juice is best enjoyed in a healthy way, by EATING the fruit.  Thanks for choosing [facility] we consider it a privelige to serve you.

## 2023-09-24 NOTE — Assessment & Plan Note (Signed)
 Lab Results  Component Value Date   HGBA1C 6.1 (A) 09/24/2023   Type 2 diabetes mellitus with diabetic polyneuropathy Blood sugar well-controlled. No hypoglycemia. Decreased foot sensation noted, regular podiatry visits needed. - Continue Ozempic  1 mg weekly, Metformin  500 mg twice daily, Jardiance  25 mg daily, Lantus  32 units daily. Diabetes foot exam completed, need for daily self foot exam discussed, advised to wear comfortable shoes and report any wound seen - Monitor blood sugar levels regularly. Appreciate collaboration with the clinical pharmacist

## 2023-09-24 NOTE — Assessment & Plan Note (Signed)
 Continue Eliquis 5 mg twice daily

## 2023-09-24 NOTE — Assessment & Plan Note (Signed)
 Adult Wellness Visit Routine wellness visit with well-controlled blood pressure. Emphasized exercise, hydration, sleep, seatbelt use, and vaccinations. - Perform CBC and CMP. - Administer foot exam. - Encourage moderate exercise 30 minutes, five days a week. - Encourage hydration with at least 64 ounces of water  daily. - Encourage 7-8 hours of sleep nightly. - Encourage use of seatbelt. - Recommend shingles, TDAP, and pneumonia vaccines at pharmacy. Patient declined colon cancer screening,

## 2023-09-24 NOTE — Progress Notes (Signed)
 Complete physical exam  Patient: Timothy Le   DOB: 1960/10/12   63 y.o. Male  MRN: [id number]  Subjective:    Chief Complaint  Patient presents with   Annual Exam       Discussed the use of AI scribe software for clinical note transcription with the patient, who gave verbal consent to proceed.  History of Present Illness Timothy Le is a 63 year old male   has a past medical history of Asthma, Chronic respiratory failure with hypoxia (HCC) (07/09/2017), COPD (chronic obstructive pulmonary disease) (HCC), Diabetes mellitus without complication (HCC), Difficult intubation, Hypertension, Severe obesity (BMI >= 40) (HCC), Shortness of breath dyspnea, Sleep apnea, and Tracheobronchitis. who presents for an annual physical exam.  He has a history of diabetes and hypertension. His recent blood sugar readings have not exceeded 112 mg/dL, except for one instance of 250 mg/dL after consuming a large amount of food. He has not experienced any episodes of hypoglycemia. Current medications include Ozempic  1 mg weekly, metformin  500 mg twice daily, Lantus  32 units daily, atorvastatin  40 mg daily, Jardiance  25 mg daily,   . He has not been taking allopurinol  for gout, as he has not had an attack in four years.  He engages in daily physical activity, such as walking to take out the trash and check the mailbox. He maintains good hydration and generally sleeps well, although he sometimes sleeps excessively. He quit smoking in 2015 and does not use drugs or alcohol. He lives alone and manages his daily activities independently.  He has not seen an eye doctor in the past 12 months and recently lost a lens from his glasses. He recently visited a pulmonologist for trach care, who discussed the possibility of scar tissue, which needs evaluation before considering trach removal. He is not on home oxygen.  No fever, chills, chest pain, worsening shortness of breath, abdominal pain, nausea, or  vomiting.    Assessment & Plan        Most recent fall risk assessment:    06/01/2023   10:41 AM  Fall Risk   Falls in the past year? 1  Number falls in past yr: 1  Injury with Fall? 0  Risk for fall due to : Other (Comment)  Risk for fall due to: Comment Patient reports he fell due to his previous apartment not being appropriate  Follow up Falls prevention discussed     Most recent depression screenings:    09/24/2023   11:16 AM 08/24/2023   11:13 AM  PHQ 2/9 Scores  PHQ - 2 Score 0 0  PHQ- 9 Score 0 0        Patient Care Team: Giah Fickett R, FNP as PCP - General (Nurse Practitioner) Ezzard Rolin BIRCH, LCSW as [facility] Care Management (Licensed Clinical Social Worker)   Outpatient Medications Prior to Visit  Medication Sig Note   Accu-Chek Softclix Lancets lancets Use to check blood sugar three times daily.    albuterol  (VENTOLIN  HFA) 108 (90 Base) MCG/ACT inhaler Inhale 2 puffs into the lungs every 6 (six) hours as needed for wheezing or shortness of breath.    apixaban  (ELIQUIS ) 5 MG TABS tablet Take 1 tablet (5 mg total) by mouth 2 (two) times daily.    atorvastatin  (LIPITOR) 40 MG tablet Take 1 tablet (40 mg total) by mouth daily.    Blood Glucose Monitoring Suppl (BLOOD GLUCOSE MONITOR SYSTEM) w/Device KIT Use as instructed to check blood sugar once daily  empagliflozin  (JARDIANCE ) 25 MG TABS tablet Take 1 tablet (25 mg total) by mouth daily before breakfast.    gabapentin  (NEURONTIN ) 100 MG capsule Take 1 capsule (100 mg total) by mouth daily.    insulin  glargine (LANTUS  SOLOSTAR) 100 UNIT/ML Solostar Pen Inject 36 units into the skin once daily. May titrate up to 44 units daily if instructed by your provider. (Patient taking differently: Inject 36 units into the skin once daily. May titrate up to 44 units daily if instructed by your provider.)    metoprolol  tartrate (LOPRESSOR ) 25 MG tablet Take 0.5 tablets (12.5 mg total) by mouth 2 (two) times daily.     QUEtiapine  (SEROQUEL ) 100 MG tablet Take 1 tablet (100 mg total) by mouth 2 (two) times daily.    sertraline  (ZOLOFT ) 100 MG tablet Take 1 tablet (100 mg total) by mouth daily.    torsemide  (DEMADEX ) 10 MG tablet Take 1 tablet (10 mg total) by mouth daily.    [DISCONTINUED] Insulin  Pen Needle (PEN NEEDLES) 32G X 4 MM MISC Use to inject Lantus  daily    [DISCONTINUED] mometasone -formoterol  (DULERA ) 100-5 MCG/ACT AERO Inhale 2 puffs into the lungs 2 (two) times daily.    [DISCONTINUED] Semaglutide , 1 MG/DOSE, (OZEMPIC , 1 MG/DOSE,) 4 MG/3ML SOPN Inject 1 mg into the skin once a week.    Phenylephrine -DM-GG (MUCINEX  FAST-MAX CONGEST COUGH) 2.5-5-100 MG/5ML LIQD Take 20 mLs by mouth daily as needed (cough and chest congestion). (Patient not taking: Reported on 09/24/2023)    senna-docusate (SENOKOT-S) 8.6-50 MG tablet Take 2 tablets by mouth at bedtime as needed for moderate constipation. (Patient not taking: Reported on 09/24/2023)    [DISCONTINUED] allopurinol  (ZYLOPRIM ) 100 MG tablet Take 1 tablet (100 mg total) by mouth daily. (Patient not taking: Reported on 09/24/2023)    [DISCONTINUED] allopurinol  (ZYLOPRIM ) 100 MG tablet Take 1 tablet (100 mg total) by mouth daily. (Patient not taking: Reported on 09/24/2023) 09/24/2023: duplicate   [DISCONTINUED] diclofenac  Sodium (VOLTAREN ) 1 % GEL Apply 2 g topically 4 (four) times daily.    [DISCONTINUED] metFORMIN  (GLUCOPHAGE -XR) 500 MG 24 hr tablet Take 1 tablet (500 mg total) by mouth in the morning and at bedtime.    No facility-administered medications prior to visit.    Review of Systems  Constitutional:  Negative for appetite change, chills, fatigue and fever.  HENT:  Negative for congestion, postnasal drip, rhinorrhea and sneezing.   Eyes:  Negative for pain, discharge and itching.  Respiratory:  Negative for cough, shortness of breath and wheezing.   Cardiovascular:  Negative for chest pain, palpitations and leg swelling.  Gastrointestinal:  Negative for  abdominal pain, constipation, nausea and vomiting.  Endocrine: Negative for cold intolerance, heat intolerance and polydipsia.  Genitourinary:  Negative for difficulty urinating, dysuria, flank pain and frequency.  Musculoskeletal:  Negative for arthralgias, back pain, joint swelling and myalgias.  Skin:  Negative for color change, pallor, rash and wound.  Allergic/Immunologic: Negative for food allergies and immunocompromised state.  Neurological:  Negative for dizziness, facial asymmetry, weakness, numbness and headaches.  Psychiatric/Behavioral:  Negative for behavioral problems, confusion, self-injury and suicidal ideas.        Objective:     BP (!) 123/54   Pulse (!) 59   Wt (!) 333 lb (151 kg)   SpO2 (!) 2%   BMI 50.63 kg/m    Physical Exam Vitals and nursing note reviewed. Exam conducted with a chaperone present.  Constitutional:      General: He is not in acute distress.  Appearance: Normal appearance. He is obese. He is not ill-appearing, toxic-appearing or diaphoretic.  HENT:     Right Ear: Tympanic membrane, ear canal and external ear normal. There is impacted cerumen.     Left Ear: Tympanic membrane, ear canal and external ear normal. There is no impacted cerumen.     Nose: Nose normal. No congestion or rhinorrhea.     Mouth/Throat:     Mouth: Mucous membranes are moist.     Pharynx: Oropharynx is clear. No oropharyngeal exudate or posterior oropharyngeal erythema.  Eyes:     General: No scleral icterus.       Right eye: No discharge.        Left eye: No discharge.     Extraocular Movements: Extraocular movements intact.     Conjunctiva/sclera: Conjunctivae normal.  Neck:     Vascular: No carotid bruit.     Comments: Trach in place Cardiovascular:     Rate and Rhythm: Normal rate and regular rhythm.     Pulses: Normal pulses.     Heart sounds: Normal heart sounds. No murmur heard.    No friction rub. No gallop.  Pulmonary:     Effort: Pulmonary effort is  normal. No respiratory distress.     Breath sounds: Normal breath sounds. No stridor. No wheezing, rhonchi or rales.  Chest:     Chest wall: No tenderness.  Abdominal:     General: Bowel sounds are normal. There is no distension.     Palpations: Abdomen is soft. There is no mass.     Tenderness: There is no abdominal tenderness. There is no right CVA tenderness, left CVA tenderness, guarding or rebound.     Hernia: No hernia is present.  Musculoskeletal:        General: No deformity or signs of injury.     Cervical back: Normal range of motion and neck supple. No rigidity or tenderness.     Right lower leg: No edema.     Left lower leg: No edema.     Comments: Uses a cane for ambulation  Lymphadenopathy:     Cervical: No cervical adenopathy.  Skin:    General: Skin is warm and dry.     Capillary Refill: Capillary refill takes less than 2 seconds.     Coloration: Skin is not jaundiced or pale.     Findings: No bruising, erythema, lesion or rash.  Neurological:     Mental Status: He is alert and oriented to person, place, and time.     Cranial Nerves: No cranial nerve deficit.     Motor: No weakness.     Gait: Gait normal.  Psychiatric:        Mood and Affect: Mood normal.        Behavior: Behavior normal.        Thought Content: Thought content normal.        Judgment: Judgment normal.     Results for orders placed or performed in visit on 09/24/23  POCT glycosylated hemoglobin (Hb A1C)  Result Value Ref Range   Hemoglobin A1C 6.1 (A) 4.0 - 5.6 %   HbA1c POC (<> result, manual entry)     HbA1c, POC (prediabetic range)     HbA1c, POC (controlled diabetic range)         Assessment & Plan:    Routine Health Maintenance and Physical Exam  Immunization History  Administered Date(s) Administered   Influenza, Seasonal, Injecte, Preservative Fre 12/28/2022, 03/09/2023   Influenza,inj,Quad  PF,6+ Mos 09/21/2013, 10/14/2015, 11/26/2017, 09/21/2018, 03/04/2019, 10/12/2019,  10/21/2020, 04/03/2022   Influenza-Unspecified 09/05/2019   Pneumococcal-Unspecified 01/31/2015    Health Maintenance  Topic Date Due   OPHTHALMOLOGY EXAM  Never done   DTaP/Tdap/Td (1 - Tdap) Never done   Pneumococcal Vaccine: 50+ Years (1 of 2 - PCV) 09/09/1980   Zoster Vaccines- Shingrix (1 of 2) Never done   Influenza Vaccine  08/20/2023   COVID-19 Vaccine (1 - 2024-25 season) Never done   Colonoscopy  12/28/2023 (Originally 09/10/2006)   HEMOGLOBIN A1C  03/23/2024   Diabetic kidney evaluation - eGFR measurement  06/22/2024   Diabetic kidney evaluation - Urine ACR  06/22/2024   FOOT EXAM  09/23/2024   Hepatitis C Screening  Completed   HIV Screening  Completed   Hepatitis B Vaccines 19-59 Average Risk  Aged Out   HPV VACCINES  Aged Out   Meningococcal B Vaccine  Aged Out    Discussed health benefits of physical activity, and encouraged him to engage in regular exercise appropriate for his age and condition.  Problem List Items Addressed This Visit       Cardiovascular and Mediastinum   Chronic diastolic CHF (congestive heart failure) (HCC)   Continue Jardiance  25 mg daily, metoprolol  12.5 mg twice daily, torsemide  10 mg daily      Acute deep vein thrombosis (DVT) of left lower extremity (HCC)   Continue Eliquis  5 mg twice daily        Respiratory   COPD (chronic obstructive pulmonary disease) (HCC)    No recent exacerbations. No home oxygen. Recent pulmonology visit with tracheostomy care. Plan for ENT to evaluate for scar tissue before tracheostomy removal. - Continue follow-up with pulmonologist. Continue albuterol  inhaler 2 puffs every 6 hours as needed, Dulera  2 puffs twice daily       Relevant Medications   mometasone -formoterol  (DULERA ) 100-5 MCG/ACT AERO     Endocrine   Type 2 diabetes mellitus with diabetic polyneuropathy, with long-term current use of insulin  (HCC)   Lab Results  Component Value Date   HGBA1C 6.1 (A) 09/24/2023   Type 2 diabetes  mellitus with diabetic polyneuropathy Blood sugar well-controlled. No hypoglycemia. Decreased foot sensation noted, regular podiatry visits needed. - Continue Ozempic  1 mg weekly, Metformin  500 mg twice daily, Jardiance  25 mg daily, Lantus  32 units daily. Diabetes foot exam completed, need for daily self foot exam discussed, advised to wear comfortable shoes and report any wound seen - Monitor blood sugar levels regularly. Appreciate collaboration with the clinical pharmacist      Relevant Medications   metFORMIN  (GLUCOPHAGE -XR) 500 MG 24 hr tablet   Semaglutide , 1 MG/DOSE, (OZEMPIC , 1 MG/DOSE,) 4 MG/3ML SOPN   Other Relevant Orders   POCT glycosylated hemoglobin (Hb A1C) (Completed)   CBC   CMP14+EGFR     Nervous and Auditory   Impacted cerumen of right ear   Use of Debrox advised        Musculoskeletal and Integument   Gout of both feet    No recent episodes in four years. Not taking allopurinol . - Refill allopurinol  100 mg daily prescription for gout prevention.      Relevant Medications   allopurinol  (ZYLOPRIM ) 100 MG tablet     Other   Morbid obesity (HCC)   Wt Readings from Last 3 Encounters:  09/24/23 (!) 333 lb (151 kg)  08/24/23 (!) 330 lb (149.7 kg)  06/23/23 (!) 336 lb (152.4 kg)   Body mass index is 50.63 kg/m.  On  Ozempic  Patient counseled on low-carb diet Encouraged moderate exercises at least 150 minutes weekly as tolerated      Relevant Medications   metFORMIN  (GLUCOPHAGE -XR) 500 MG 24 hr tablet   Semaglutide , 1 MG/DOSE, (OZEMPIC , 1 MG/DOSE,) 4 MG/3ML SOPN   Chronic pain of left knee   Relevant Medications   diclofenac  Sodium (VOLTAREN ) 1 % GEL   Annual physical exam - Primary   Adult Wellness Visit Routine wellness visit with well-controlled blood pressure. Emphasized exercise, hydration, sleep, seatbelt use, and vaccinations. - Perform CBC and CMP. - Administer foot exam. - Encourage moderate exercise 30 minutes, five days a week. - Encourage  hydration with at least 64 ounces of water  daily. - Encourage 7-8 hours of sleep nightly. - Encourage use of seatbelt. - Recommend shingles, TDAP, and pneumonia vaccines at pharmacy. Patient declined colon cancer screening,      Food insecurity   Gets food stamps Food from the clinic pantry given      Impaired vision   Ophthalmology phone number and address provided Advised to schedule an appointment      Other Visit Diagnoses       Screening for prostate cancer       Relevant Orders   PSA      Return in about 4 months (around 01/24/2024) for HTN, DM.     Kenzey Birkland R Shivam Mestas, FNP

## 2023-09-24 NOTE — Assessment & Plan Note (Signed)
  No recent exacerbations. No home oxygen. Recent pulmonology visit with tracheostomy care. Plan for ENT to evaluate for scar tissue before tracheostomy removal. - Continue follow-up with pulmonologist. Continue albuterol  inhaler 2 puffs every 6 hours as needed, Dulera  2 puffs twice daily

## 2023-09-24 NOTE — Assessment & Plan Note (Signed)
 Continue Jardiance  25 mg daily, metoprolol  12.5 mg twice daily, torsemide  10 mg daily

## 2023-09-24 NOTE — Assessment & Plan Note (Signed)
 Wt Readings from Last 3 Encounters:  09/24/23 (!) 333 lb (151 kg)  08/24/23 (!) 330 lb (149.7 kg)  06/23/23 (!) 336 lb (152.4 kg)   Body mass index is 50.63 kg/m.  On Ozempic  Patient counseled on low-carb diet Encouraged moderate exercises at least 150 minutes weekly as tolerated

## 2023-09-24 NOTE — Assessment & Plan Note (Signed)
  No recent episodes in four years. Not taking allopurinol . - Refill allopurinol  100 mg daily prescription for gout prevention.

## 2023-09-24 NOTE — Assessment & Plan Note (Signed)
Use of Debrox advised

## 2023-09-24 NOTE — Assessment & Plan Note (Signed)
 Ophthalmology phone number and address provided Advised to schedule an appointment

## 2023-09-25 LAB — CMP14+EGFR
ALT: 24 IU/L (ref 0–44)
AST: 21 IU/L (ref 0–40)
Albumin: 4.1 g/dL (ref 3.9–4.9)
Alkaline Phosphatase: 126 IU/L — ABNORMAL HIGH (ref 44–121)
BUN/Creatinine Ratio: 18 (ref 10–24)
BUN: 18 mg/dL (ref 8–27)
Bilirubin Total: 0.5 mg/dL (ref 0.0–1.2)
CO2: 27 mmol/L (ref 20–29)
Calcium: 9.5 mg/dL (ref 8.6–10.2)
Chloride: 105 mmol/L (ref 96–106)
Creatinine, Ser: 1 mg/dL (ref 0.76–1.27)
Globulin, Total: 2.9 g/dL (ref 1.5–4.5)
Glucose: 95 mg/dL (ref 70–99)
Potassium: 3.6 mmol/L (ref 3.5–5.2)
Sodium: 147 mmol/L — ABNORMAL HIGH (ref 134–144)
Total Protein: 7 g/dL (ref 6.0–8.5)
eGFR: 85 mL/min/1.73 (ref >59)

## 2023-09-25 LAB — CBC
Hematocrit: 44.5 % (ref 37.5–51.0)
Hemoglobin: 14 g/dL (ref 13.0–17.7)
MCH: 28.4 pg (ref 26.6–33.0)
MCHC: 31.5 g/dL (ref 31.5–35.7)
MCV: 90 fL (ref 79–97)
Platelets: 228 x10E3/uL (ref 150–450)
RBC: 4.93 x10E6/uL (ref 4.14–5.80)
RDW: 14.5 % (ref 11.6–15.4)
WBC: 5 x10E3/uL (ref 3.4–10.8)

## 2023-09-25 LAB — PSA: Prostate Specific Ag, Serum: 1.3 ng/mL (ref 0.0–4.0)

## 2023-09-27 ENCOUNTER — Ambulatory Visit: Payer: Self-pay | Admitting: Nurse Practitioner

## 2023-10-05 ENCOUNTER — Telehealth: Payer: Self-pay | Admitting: Licensed Clinical Social Worker

## 2023-10-08 ENCOUNTER — Other Ambulatory Visit (HOSPITAL_COMMUNITY): Payer: Self-pay

## 2023-10-08 ENCOUNTER — Other Ambulatory Visit: Payer: Self-pay

## 2023-10-08 DIAGNOSIS — Z794 Long term (current) use of insulin: Secondary | ICD-10-CM

## 2023-10-08 MED ORDER — ACCU-CHEK GUIDE TEST VI STRP
ORAL_STRIP | 11 refills | Status: AC
  Filled 2023-10-08: qty 100, 30d supply, fill #0

## 2023-10-08 NOTE — Progress Notes (Signed)
 10/08/2023 Name: Timothy Le MRN: [id number] DOB: 10/18/1960  Chief Complaint  Patient presents with   Diabetes    Timothy Le is a 63 y.o. year old male who presented for a telephone visit. PMH includes HFpEF, severe OSA, HTN, COPD, chronic respiratory failure with tracheostomy (followed at [facility] tracheostomy clinic), T2DM, BMI > 50.   They were referred to the pharmacist by their PCP for assistance in managing diabetes.   Subjective: Patient was last seen by PCP, Lorice Shall, NP on 06/23/23. A1C had decreased from 6.5% to 6.1%. He reported injected 44 units of Lantus  daily along with Ozempic  0.5 mg weekly, metformin  500 mg BID, and Jardiance  25 mg daily. He was instructed to reduce Lantus  to 36 units daily to reduce risk of hypoglycemia. His Medicaid insurance still had not been updated from a long-term care plan and glucometer + supplies could not be filled. He was engaged by pharmacy via telephone on 07/07/23. At this time, he had been enrolled in a standard Medicaid plan. We renewed his PA for Jardiance  and Ozempic . He was instructed to increase Ozempic  to 1 mg weekly and decrease Lantus  to 28 units daily. His BG meter and supplies were also shipped via [facility] pharmacy. At pharmacy f/u on 08/23/23, patient reported doing well. He had continued to inject Lantus  36 units daily, but had been monitor BG with his meter and denied hypoglycemia. He saw PCP on 09/24/23 and A1C remained stable at 6.1%.   Today, patient reports doing well. He reports he is taking Lantus  32 units daily. He is running low on Jardiance  and test strips.   Care Team: Primary Care Provider: [name], [name], FNP ; Next Scheduled Visit: 01/24/24 Pulmonology: Reggy Salt, MD: Next Scheduled Visit: 06/29/23 - did not show, needs to be rescheduled  Medication Access/Adherence  Current Pharmacy:  DARRYLE LONG - [facility] [address] [place] [address] Phone: [phone] Fax:  [phone]  [id number] - [place], [place] - [address] AT [facility] [address] [place] [address] Phone: [phone] Fax: [phone]   Patient reports affordability concerns with their medications: No  Patient reports access/transportation concerns to their pharmacy: No  Patient reports adherence concerns with their medications:  No    Reports that he got a Medicaid insurance card in the mail. Reports that he set up his card to get $50/mo to buy fresh fruit, vegetables, meat. His niece is using this for him, and she is choosing healthy option.   Diabetes:  Current medications: Ozempic  1 mg weekly (Saturdays), Jardiance  25 mg daily, metformin  XR 500 mg BID (taking once daily), Lantus  32units daily   He denies GI AE on current dose of Ozempic . Continues to report appetite suppression. His snacking has decreased.  He has been stopping eating when he feels full. He reports he can eat a maximum of 2 meals per day, but that sometimes these are larger meals.  Current BG readings: Using Accu Chek Guide meter once daily 111 this morning, reports that sugars have been 80s (feels shakiness with this #) He reports that he is not interested in CGM at this time. He has a flip phone, and does not want to use a reader to check the values.   Patient reports hypoglycemic s/sx including dizziness, shakiness when BG are in the 80s.  Patient denies hyperglycemic symptoms including polyuria, polydipsia, polyphagia, nocturia, neuropathy, blurred vision. He does report that he  sees some black spots in his vision, but this is usual for him - he is due for ophthalmology appt.   Current meal patterns: 2 meals/day - reports he has been eating healthier (had chicken wrap from [employer] - where his roommate works) - Breakfast: sausage, eggs. Yesterday breakfast was biscuit, home fries, and a piece of meat - and he did not get hungry again all  day. - Supper: [place] burger, salad - Snacks: He reports that he is no longer buying junk food snacks. - Drinks: water , fruit juice once a day - minutemade, coffee with cream and splenda  Physical activity: Reports that he has been walking more - to the garbage can, mail box.   Hyperlipidemia/ASCVD Risk Reduction  Current lipid lowering medications: atorvastatin  40 mg daily  The 10-year ASCVD risk score (Arnett DK, et al., 2019) is: 22.8%   Values used to calculate the score:     Age: 11 years     Clincally relevant sex: Male     Is Non-Hispanic African American: Yes     Diabetic: Yes     Tobacco smoker: No     Systolic Blood Pressure: 123 mmHg     Is BP treated: Yes     HDL Cholesterol: 45 mg/dL     Total Cholesterol: 145 mg/dL   Objective:  BP Readings from Last 3 Encounters:  09/24/23 (!) 123/54  08/24/23 (!) 152/64  06/23/23 134/63    Lab Results  Component Value Date   HGBA1C 6.1 (A) 09/24/2023   HGBA1C 6.1 (A) 06/23/2023   HGBA1C 6.7 (A) 12/28/2022    UACR 06/23/23: 11 mg/g (prev 34 mg/g)  Lab Results  Component Value Date   CREATININE 1.00 09/24/2023   BUN 18 09/24/2023   NA 147 (H) 09/24/2023   K 3.6 09/24/2023   CL 105 09/24/2023   CO2 27 09/24/2023    Lab Results  Component Value Date   CHOL 145 03/08/2023   HDL 45 03/08/2023   LDLCALC 82 03/08/2023   LDLDIRECT 65 06/23/2023   TRIG 96 03/08/2023   CHOLHDL 3.2 03/08/2023    Medications Reviewed Today     Reviewed by Brinda Lorain SQUIBB, RPH (Pharmacist) on 10/08/23 at 1230  Med List Status: <None>   Medication Order Taking? Sig Documenting Provider Last Dose Status Informant  Accu-Chek Softclix Lancets lancets [id number]  Use to check blood sugar three times daily. [name], [name], FNP  Active            Med Note ANGELICA, JASMINE D   Tue Jun 01, 2023 10:27 AM) Pt has contacted Medicaid to see if they will cover this   albuterol  (VENTOLIN  HFA) 108 (90 Base) MCG/ACT inhaler [id number]  Inhale 2  puffs into the lungs every 6 (six) hours as needed for wheezing or shortness of breath. Neysa Rama D, MD  Active   allopurinol  (ZYLOPRIM ) 100 MG tablet [id number]  Take 1 tablet (100 mg total) by mouth daily. [name], [name], FNP  Active   apixaban  (ELIQUIS ) 5 MG TABS tablet [id number]  Take 1 tablet (5 mg total) by mouth 2 (two) times daily. [name], [name], FNP  Active   atorvastatin  (LIPITOR) 40 MG tablet [id number]  Take 1 tablet (40 mg total) by mouth daily. [name], [name], FNP  Active   Blood Glucose Monitoring Suppl (BLOOD GLUCOSE MONITOR SYSTEM) w/Device KIT [id number]  Use as instructed to check blood sugar once daily [name], [name], FNP  Active  diclofenac  Sodium (VOLTAREN ) 1 % GEL [id number]  Apply 2 g topically 4 (four) times daily. [name], [name], FNP  Active   empagliflozin  (JARDIANCE ) 25 MG TABS tablet [id number]  Take 1 tablet (25 mg total) by mouth daily before breakfast. [name], [name], FNP  Active   gabapentin  (NEURONTIN ) 100 MG capsule [id number]  Take 1 capsule (100 mg total) by mouth daily. [name], [name], FNP  Active   insulin  glargine (LANTUS  SOLOSTAR) 100 UNIT/ML Solostar Pen [id number] Yes Inject 36 units into the skin once daily. May titrate up to 44 units daily if instructed by your provider.  Patient taking differently: Inject 36 units into the skin once daily. May titrate up to 44 units daily if instructed by your provider.   [name], [name], FNP  Active   Insulin  Pen Needle (PEN NEEDLES) 32G X 4 MM MISC [id number]  Use to inject Lantus  daily [name], [name], FNP  Active   metFORMIN  (GLUCOPHAGE -XR) 500 MG 24 hr tablet [id number]  Take 1 tablet (500 mg total) by mouth in the morning and at bedtime. [name], [name], FNP  Active   metoprolol  tartrate (LOPRESSOR ) 25 MG tablet [id number]  Take 0.5 tablets (12.5 mg total) by mouth 2 (two) times daily. [name], [name], FNP  Active   mometasone -formoterol  (DULERA ) 100-5 MCG/ACT AERO  [id number]  Inhale 2 puffs into the lungs 2 (two) times daily. [name], [name], FNP  Active   Phenylephrine -DM-GG (MUCINEX  FAST-MAX CONGEST COUGH) 2.5-5-100 MG/5ML LIQD [id number]  Take 20 mLs by mouth daily as needed (cough and chest congestion).  Patient not taking: Reported on 09/24/2023   [provider]  Active Self  QUEtiapine  (SEROQUEL ) 100 MG tablet [id number]  Take 1 tablet (100 mg total) by mouth 2 (two) times daily. [name], [name], FNP  Active   Semaglutide , 1 MG/DOSE, (OZEMPIC , 1 MG/DOSE,) 4 MG/3ML SOPN [id number] Yes Inject 1 mg into the skin once a week. [name], [name], FNP  Active   senna-docusate (SENOKOT-S) 8.6-50 MG tablet [id number]  Take 2 tablets by mouth at bedtime as needed for moderate constipation.  Patient not taking: Reported on 09/24/2023   Caleen Burgess BROCKS, MD  Active   sertraline  (ZOLOFT ) 100 MG tablet [id number]  Take 1 tablet (100 mg total) by mouth daily. [name], [name], FNP  Active   torsemide  (DEMADEX ) 10 MG tablet [id number]  Take 1 tablet (10 mg total) by mouth daily. [name], [name], FNP  Active              Assessment/Plan:     Diabetes: - Currently controlled based on last A1c 6.1%, below goal <7%. Patient is a great candidate for GLP-1 given BMI >50. He is tolerating Ozempic  1 mg well and has a current supply to last him until September. Recommend 20% decrease in basal insulin . - Reviewed long term cardiovascular and renal outcomes of uncontrolled blood sugar - Reviewed goal A1c, goal fasting, and goal 2 hour post prandial glucose - Reviewed dietary modifications including increasing intake of protein and non-starchy vegetables. Limit portion sizes of carbohydrate foods. Also discussed replacing sugary beverages with zero sugar options.  - Recommend to continue Ozempic  to 1 mg weekly.  - Recommend to DECREASE Lantus  26 units daily.  - Recommend to continue Jardiance  25 mg daily. Facilitated refill as requested by patient. -  Recommend to continue metformin  XR 500  mg BID.  - Recommend to check fasting blood glucose daily and occasional 2-hr PPG - provided refill  of Accu Chek test strips - Most recent UACR WNL - Next A1C due 01/24/23   Hyperlipidemia/ASCVD Risk Reduction:  Currently controlled with last direct LDL of 65 mg/dL below goal <29 mg/dL given dx of DM. Appropriate to continue high intensity statin. - Reviewed long term complications of uncontrolled cholesterol - Reviewed dietary recommendations including cutting back saturated fats and excess carbohydrates - Recommend to continue atorvastatin  40 mg daily    Follow Up Plan: PCP 01/24/24, PharmD telephone 11/12/23   Lorain Baseman, PharmD [facility] [phone]

## 2023-10-26 ENCOUNTER — Encounter: Payer: Self-pay | Admitting: Podiatry

## 2023-10-26 ENCOUNTER — Ambulatory Visit: Admitting: Podiatry

## 2023-10-26 DIAGNOSIS — Z794 Long term (current) use of insulin: Secondary | ICD-10-CM

## 2023-10-26 DIAGNOSIS — E1165 Type 2 diabetes mellitus with hyperglycemia: Secondary | ICD-10-CM | POA: Diagnosis not present

## 2023-10-26 DIAGNOSIS — B351 Tinea unguium: Secondary | ICD-10-CM

## 2023-10-26 DIAGNOSIS — M79674 Pain in right toe(s): Secondary | ICD-10-CM | POA: Diagnosis not present

## 2023-10-26 DIAGNOSIS — M79675 Pain in left toe(s): Secondary | ICD-10-CM | POA: Diagnosis not present

## 2023-10-26 NOTE — Progress Notes (Signed)
 This patient returns to my office for at risk foot care.  This patient requires this care by a professional since this patient will be at risk due to having diabetes  This patient is unable to cut nails himself since the patient cannot reach his nails.These nails are painful walking and wearing shoes.  This patient presents for at risk foot care today.  General Appearance  Alert, conversant and in no acute stress.  Vascular  Dorsalis pedis and posterior tibial  pulses are  not   bilaterally due to severe swelling..  Capillary return is within normal limits  bilaterally. Temperature is within normal limits  bilaterally.  Neurologic  Senn-Weinstein monofilament wire test within normal limits  bilaterally. Muscle power within normal limits bilaterally.  Nails Thick disfigured discolored nails with subungual debris  from hallux to fifth toes bilaterally. No evidence of bacterial infection or drainage bilaterally. Pincer nails hallux  B/l.  Orthopedic  No limitations of motion  feet .  No crepitus or effusions noted.  No bony pathology or digital deformities noted.  HAV  B/L.  Skin  normotropic skin with no porokeratosis noted bilaterally.  No signs of infections or ulcers noted.   Plantar callus along weight bearing areas both feet.  Onychomycosis  Pain in right toes  Pain in left toes  Consent was obtained for treatment procedures.   Mechanical debridement of nails 1-5  bilaterally performed with a nail nipper.  Filed with dremel without incident.    Return office visit      3  months              Told patient to return for periodic foot care and evaluation due to potential at risk complications.   Cordella Bold DPM

## 2023-11-02 ENCOUNTER — Telehealth: Payer: Self-pay | Admitting: Licensed Clinical Social Worker

## 2023-11-12 ENCOUNTER — Other Ambulatory Visit: Payer: Self-pay

## 2023-11-12 ENCOUNTER — Other Ambulatory Visit: Payer: Self-pay | Admitting: Nurse Practitioner

## 2023-11-12 DIAGNOSIS — E1142 Type 2 diabetes mellitus with diabetic polyneuropathy: Secondary | ICD-10-CM

## 2023-11-12 DIAGNOSIS — L309 Dermatitis, unspecified: Secondary | ICD-10-CM

## 2023-11-12 MED ORDER — DESONIDE 0.05 % EX LOTN
TOPICAL_LOTION | Freq: Two times a day (BID) | CUTANEOUS | 0 refills | Status: DC
Start: 2023-11-12 — End: 2023-11-15
  Filled 2023-11-12: qty 59, 30d supply, fill #0

## 2023-11-12 NOTE — Progress Notes (Signed)
 11/12/2023 Name: Timothy Le MRN: [id number] DOB: 1960-06-20  Chief Complaint  Patient presents with   Diabetes    Timothy Le is a 63 y.o. year old male who presented for a telephone visit. PMH includes HFpEF, severe OSA, HTN, COPD, chronic respiratory failure with tracheostomy (followed at [facility] tracheostomy clinic), T2DM, BMI > 50.   They were referred to the pharmacist by their PCP for assistance in managing diabetes.   Subjective: Patient was last seen by PCP, Timothy Shall, NP on 06/23/23. A1C had decreased from 6.5% to 6.1%. He reported injected 44 units of Lantus  daily along with Ozempic  0.5 mg weekly, metformin  500 mg BID, and Jardiance  25 mg daily. He was instructed to reduce Lantus  to 36 units daily to reduce risk of hypoglycemia. His Medicaid insurance still had not been updated from a long-term care plan and glucometer + supplies could not be filled. He was engaged by pharmacy via telephone on 07/07/23. At this time, he had been enrolled in a standard Medicaid plan. We renewed his PA for Jardiance  and Ozempic . He was instructed to increase Ozempic  to 1 mg weekly and decrease Lantus  to 28 units daily. His BG meter and supplies were also shipped via [facility] pharmacy. At pharmacy f/u on 08/23/23, patient reported doing well. He had continued to inject Lantus  36 units daily, but had been monitor BG with his meter and denied hypoglycemia. He saw PCP on 09/24/23 and A1C remained stable at 6.1%. At pharmacy call on 10/08/23, patient was doing well, but having some s/sx of hypoglycemia. He was instructed to reduce Lantus  to 26 units daily.  Today, patient reports doing well. He reports he is taking Lantus  26 units daily as instructed. His main concern today is that his face has been very dry and peeling. He states he used to take something for this in the past (desonide 0.05% lotion?). He states he has been using vasoline and lotion without relief. Requests to discuss with PCP.   Care  Team: Primary Care Provider: [name], [name], FNP ; Next Scheduled Visit: 01/24/24 Pulmonology: Reggy Salt, MD: Next Scheduled Visit: 06/29/23 - did not show, needs to be rescheduled  Medication Access/Adherence  Current Pharmacy:  DARRYLE LONG - [facility] [address] [place] [address] Phone: [phone] Fax: [phone]  [id number] [place], [place] - [address] AT [facility] [address] [place] [address] Phone: [phone] Fax: [phone]   Patient reports affordability concerns with their medications: No  Patient reports access/transportation concerns to their pharmacy: No  Patient reports adherence concerns with their medications:  No    Reports that he got a Medicaid insurance card in the mail. Reports that he set up his card to get $50/mo to buy fresh fruit, vegetables, meat. His niece is using this for him, and she is choosing healthy option.   Diabetes:  Current medications: Ozempic  1 mg weekly (Saturdays), Jardiance  25 mg daily, metformin  XR 500 mg BID (taking once daily), Lantus  26 units daily   He denies GI AE on current dose of Ozempic . Continues to report appetite suppression. His snacking has decreased.  He has been stopping eating when he feels full. He reports he can eat a maximum of 2 meals per day, but that sometimes these are larger meals.  Current BG readings: Using Accu Chek Guide meter once daily 111 this morning - reports it has been like this for a couple  days Denies BG < 70. Lowest recently is 90. Highest FBG he recalls is 115 mg/dL  Patient denies hypoglycemic s/sx including dizziness, shakiness, sweatiness. Reports these symptoms have resolved since decreasing Lantus  to 26 units daily.  Patient denies hyperglycemic symptoms including polyuria, polydipsia, polyphagia, nocturia, neuropathy, blurred vision. He does report that he sees some black spots in his  vision, but this is usual for him - he is due for ophthalmology appt.   Current meal patterns: 2 meals/day - reports he has been eating healthier (had chicken wrap from [employer] - where his roommate works) - Breakfast: sausage, eggs - Supper: [place] burger, salad - Snacks: He reports that he is no longer buying junk food snacks. - Drinks: water , fruit juice once a day - minutemade, coffee with cream and splenda  Physical activity: Reports that he has been walking more - to the garbage can, mail box.   Hyperlipidemia/ASCVD Risk Reduction  Current lipid lowering medications: atorvastatin  40 mg daily  The 10-year ASCVD risk score (Arnett DK, et al., 2019) is: 22.8%   Values used to calculate the score:     Age: 63 years     Clincally relevant sex: Male     Is Non-Hispanic African American: Yes     Diabetic: Yes     Tobacco smoker: No     Systolic Blood Pressure: 123 mmHg     Is BP treated: Yes     HDL Cholesterol: 45 mg/dL     Total Cholesterol: 145 mg/dL   Objective:  BP Readings from Last 3 Encounters:  09/24/23 (!) 123/54  08/24/23 (!) 152/64  06/23/23 134/63    Lab Results  Component Value Date   HGBA1C 6.1 (A) 09/24/2023   HGBA1C 6.1 (A) 06/23/2023   HGBA1C 6.7 (A) 12/28/2022    UACR 06/23/23: 11 mg/g (prev 34 mg/g)  Lab Results  Component Value Date   CREATININE 1.00 09/24/2023   BUN 18 09/24/2023   NA 147 (H) 09/24/2023   K 3.6 09/24/2023   CL 105 09/24/2023   CO2 27 09/24/2023    Lab Results  Component Value Date   CHOL 145 03/08/2023   HDL 45 03/08/2023   LDLCALC 82 03/08/2023   LDLDIRECT 65 06/23/2023   TRIG 96 03/08/2023   CHOLHDL 3.2 03/08/2023    Medications Reviewed Today     Reviewed by Brinda Lorain SQUIBB, RPH (Pharmacist) on 11/12/23 at 1126  Med List Status: <None>   Medication Order Taking? Sig Documenting Provider Last Dose Status Informant  Accu-Chek Softclix Lancets lancets [id number]  Use to check blood sugar three times  daily. [name], [name], FNP  Active            Med Note ANGELICA, JASMINE D   Tue Jun 01, 2023 10:27 AM) Pt has contacted Medicaid to see if they will cover this   albuterol  (VENTOLIN  HFA) 108 (90 Base) MCG/ACT inhaler [id number]  Inhale 2 puffs into the lungs every 6 (six) hours as needed for wheezing or shortness of breath. Neysa Rama D, MD  Active   allopurinol  (ZYLOPRIM ) 100 MG tablet [id number]  Take 1 tablet (100 mg total) by mouth daily. [name], [name], FNP  Active   apixaban  (ELIQUIS ) 5 MG TABS tablet [id number] Yes Take 1 tablet (5 mg total) by mouth 2 (two) times daily. [name], [name], FNP  Active   atorvastatin  (LIPITOR) 40 MG tablet [id number] Yes Take 1 tablet (40 mg total) by mouth daily. [name], [name],  FNP  Active   Blood Glucose Monitoring Suppl (BLOOD GLUCOSE MONITOR SYSTEM) w/Device KIT [id number]  Use as instructed to check blood sugar once daily [name], [name], FNP  Active   diclofenac  Sodium (VOLTAREN ) 1 % GEL [id number]  Apply 2 g topically 4 (four) times daily. [name], [name], FNP  Active   empagliflozin  (JARDIANCE ) 25 MG TABS tablet [id number] Yes Take 1 tablet (25 mg total) by mouth daily before breakfast. [name], [name], FNP  Active   gabapentin  (NEURONTIN ) 100 MG capsule [id number] Yes Take 1 capsule (100 mg total) by mouth daily. [name], [name], FNP  Active   glucose blood (ACCU-CHEK GUIDE TEST) test strip [id number]  Use as instructed [name], [name], FNP  Active   insulin  glargine (LANTUS  SOLOSTAR) 100 UNIT/ML Solostar Pen [id number] Yes Inject 36 units into the skin once daily. May titrate up to 44 units daily if instructed by your provider.  Patient taking differently: Inject 36 units into the skin once daily. May titrate up to 44 units daily if instructed by your provider.   [name], [name], FNP  Active   Insulin  Pen Needle (PEN NEEDLES) 32G X 4 MM MISC [id number]  Use to inject Lantus  daily [name], [name], FNP  Active    metFORMIN  (GLUCOPHAGE -XR) 500 MG 24 hr tablet [id number] Yes Take 1 tablet (500 mg total) by mouth in the morning and at bedtime. [name], [name], FNP  Active   metoprolol  tartrate (LOPRESSOR ) 25 MG tablet [id number] Yes Take 0.5 tablets (12.5 mg total) by mouth 2 (two) times daily. [name], [name], FNP  Active   mometasone -formoterol  (DULERA ) 100-5 MCG/ACT AERO [id number]  Inhale 2 puffs into the lungs 2 (two) times daily. [name], [name], FNP  Active   Phenylephrine -DM-GG (MUCINEX  FAST-MAX CONGEST COUGH) 2.5-5-100 MG/5ML LIQD [id number]  Take 20 mLs by mouth daily as needed (cough and chest congestion).  Patient not taking: Reported on 09/24/2023   [provider]  Active Self  QUEtiapine  (SEROQUEL ) 100 MG tablet [id number]  Take 1 tablet (100 mg total) by mouth 2 (two) times daily. [name], [name], FNP  Active   Semaglutide , 1 MG/DOSE, (OZEMPIC , 1 MG/DOSE,) 4 MG/3ML SOPN [id number] Yes Inject 1 mg into the skin once a week. [name], [name], FNP  Active   senna-docusate (SENOKOT-S) 8.6-50 MG tablet [id number]  Take 2 tablets by mouth at bedtime as needed for moderate constipation.  Patient not taking: Reported on 09/24/2023   Caleen Burgess BROCKS, MD  Active   sertraline  (ZOLOFT ) 100 MG tablet [id number] Yes Take 1 tablet (100 mg total) by mouth daily. [name], [name], FNP  Active   torsemide  (DEMADEX ) 10 MG tablet [id number] Yes Take 1 tablet (10 mg total) by mouth daily. [name], [name], FNP  Active              Assessment/Plan:     Diabetes: - Currently controlled based on last A1c 6.1%, below goal <7%. Patient is a great candidate for GLP-1 given BMI >50. He is tolerating Ozempic  1 mg well and has a current supply to last him until September. Hypoglycemia has resolved with previous decrease in basal insulin . Will continue current dose for now. Will revisit CGM at follow-up. - Reviewed long term cardiovascular and renal outcomes of uncontrolled blood sugar -  Reviewed goal A1c, goal fasting, and goal 2 hour post prandial glucose - Reviewed dietary modifications including increasing intake of protein and non-starchy vegetables. Limit portion sizes of carbohydrate foods. Also  discussed replacing sugary beverages with zero sugar options.  - Recommend to continue Ozempic  to 1 mg weekly.  - Recommend to continue Lantus  26 units daily.  - Recommend to continue Jardiance  25 mg daily - Recommend to continue metformin  XR 500 mg BID. Patient continues to take daily - ok to continue at this dose for now given controlled sugars. - Recommend to check fasting blood glucose daily and occasional 2-hr PPG. - Most recent UACR WNL - Next A1C due 01/24/23   Hyperlipidemia/ASCVD Risk Reduction:  Currently controlled with last direct LDL of 65 mg/dL below goal <29 mg/dL given dx of DM. Appropriate to continue high intensity statin. - Reviewed long term complications of uncontrolled cholesterol - Reviewed dietary recommendations including cutting back saturated fats and excess carbohydrates - Recommend to continue atorvastatin  40 mg daily    Will discuss patient's concern of face peeling and dryness with PCP. Appears he was prescribed desonide 0.05% lotion in the past for a similar issue. Appears that desonide 0.05% cream is preferred on Medicaid drug list. He could apply a thin layer once daily for 2-3 weeks, then continue treating with lotion. Will discuss with PCP, as she may prefer to see him in person prior to initiating treatment.   Follow Up Plan: PCP 01/24/24, PharmD telephone 01/07/24   Lorain Baseman, PharmD [facility] [phone]

## 2023-11-13 ENCOUNTER — Other Ambulatory Visit (HOSPITAL_COMMUNITY): Payer: Self-pay

## 2023-11-15 ENCOUNTER — Other Ambulatory Visit (HOSPITAL_COMMUNITY): Payer: Self-pay

## 2023-11-15 ENCOUNTER — Other Ambulatory Visit: Payer: Self-pay | Admitting: Nurse Practitioner

## 2023-11-15 ENCOUNTER — Other Ambulatory Visit (HOSPITAL_BASED_OUTPATIENT_CLINIC_OR_DEPARTMENT_OTHER): Payer: Self-pay

## 2023-11-15 ENCOUNTER — Telehealth: Payer: Self-pay | Admitting: Nurse Practitioner

## 2023-11-15 ENCOUNTER — Other Ambulatory Visit: Payer: Self-pay

## 2023-11-15 DIAGNOSIS — L309 Dermatitis, unspecified: Secondary | ICD-10-CM

## 2023-11-15 MED ORDER — DESONIDE 0.05 % EX CREA
TOPICAL_CREAM | Freq: Two times a day (BID) | CUTANEOUS | 0 refills | Status: DC
  Filled 2023-11-15: qty 15, 30d supply, fill #0

## 2023-11-15 NOTE — Telephone Encounter (Signed)
 Copied from CRM [id number]. Topic: General - Other >> Nov 12, 2023 11:10 AM Timothy Le wrote: Reason for CRM: Pt called returning call from Lorain Baseman please call [phone] pt to reschedule.

## 2023-11-17 ENCOUNTER — Encounter: Payer: Self-pay | Admitting: Licensed Clinical Social Worker

## 2023-11-17 ENCOUNTER — Telehealth: Payer: Self-pay | Admitting: Licensed Clinical Social Worker

## 2023-11-17 NOTE — Patient Instructions (Signed)
 Alm GORMAN Gambler - I am sorry I was unable to reach you today for our scheduled appointment. I work with [name], [name], FNP and am calling to support your healthcare needs. Please contact me at [phone] at your earliest convenience. I look forward to speaking with you soon.   Thank you,  Rolin Kerns, LCSW [place]  [facility], [facility] Clinical Social Worker Direct Dial: [phone]  Fax: [phone] Website: [email or web address] 5:23 PM

## 2023-12-08 ENCOUNTER — Other Ambulatory Visit: Payer: Self-pay | Admitting: Licensed Clinical Social Worker

## 2023-12-09 ENCOUNTER — Other Ambulatory Visit: Payer: Self-pay | Admitting: Nurse Practitioner

## 2023-12-09 ENCOUNTER — Other Ambulatory Visit (HOSPITAL_COMMUNITY): Payer: Self-pay

## 2023-12-09 DIAGNOSIS — L309 Dermatitis, unspecified: Secondary | ICD-10-CM

## 2023-12-10 ENCOUNTER — Other Ambulatory Visit: Payer: Self-pay

## 2023-12-10 ENCOUNTER — Other Ambulatory Visit (HOSPITAL_COMMUNITY): Payer: Self-pay

## 2023-12-10 MED ORDER — DESONIDE 0.05 % EX CREA
TOPICAL_CREAM | Freq: Two times a day (BID) | CUTANEOUS | 0 refills | Status: AC
  Filled 2023-12-10: qty 15, 14d supply, fill #0

## 2023-12-10 NOTE — Telephone Encounter (Signed)
 Please advise [facility]

## 2023-12-21 ENCOUNTER — Other Ambulatory Visit: Payer: Self-pay | Admitting: Licensed Clinical Social Worker

## 2023-12-21 NOTE — Patient Instructions (Signed)
 Visit Information  Timothy Le was given information about Medicaid Managed Care team care coordination services as a part of their [employer] Medicaid benefit.   If you would like to schedule transportation through your [employer] [place] plan, please call the following number at least 2 days in advance of your appointment: [phone]  If you are experiencing a behavioral health crisis, call the [employer] [place]  Behavioral Health Crisis Line at [phone]). The line is available 24 hours a day, seven days a week.   Please see education materials related to topics discussed provided Pt declined  The patient verbalized understanding of instructions, educational materials, and care plan provided today and DECLINED offer to receive copy of patient instructions, educational materials, and care plan.   Licensed Clinical Social Worker will call on 12/26 at 12:30 PM  Rolin Kerns, LCSW [place]  [facility], [facility] Clinical Social Worker Direct Dial: [phone]  Fax: [phone] Website: [email or web address] 2:19 PM   Following is a copy of your plan of care: There are no care plans that you recently modified to display for this patient.

## 2023-12-21 NOTE — Patient Outreach (Signed)
 Complex Care Management   Visit Note  12/21/2023  Name:  Timothy Le MRN: [id number] DOB: 1960/06/15  Situation: Referral received for Complex Care Management related to Stress I obtained verbal consent from Patient.  Visit completed with Patient  on the phone  Background:   Past Medical History:  Diagnosis Date   Asthma    Chronic respiratory failure with hypoxia (HCC) 07/09/2017   COPD (chronic obstructive pulmonary disease) (HCC)    Diabetes mellitus without complication (HCC)    Difficult intubation    Hypertension    Severe obesity (BMI >= 40) (HCC)    Shortness of breath dyspnea    Sleep apnea    Tracheobronchitis     Assessment: Patient Reported Symptoms:  Cognitive Cognitive Status: No symptoms reported, Alert and oriented to person, place, and time, Normal speech and language skills Cognitive/Intellectual Conditions Management [RPT]: None reported or documented in medical history or problem list   Health Maintenance Behaviors: Annual physical exam  Neurological Neurological Review of Symptoms: Not assessed    HEENT HEENT Symptoms Reported: Not assessed      Cardiovascular Cardiovascular Symptoms Reported: Not assessed    Respiratory Respiratory Symptoms Reported: Not assesed    Endocrine Endocrine Symptoms Reported: Not assessed    Gastrointestinal Gastrointestinal Symptoms Reported: Not assessed      Genitourinary Genitourinary Symptoms Reported: Not assessed    Integumentary Integumentary Symptoms Reported: Not assessed    Musculoskeletal Musculoskelatal Symptoms Reviewed: Not assessed        Psychosocial Psychosocial Symptoms Reported: Other Other Psychosocial Conditions: Stress Behavioral Management Strategies: Coping strategies, Support system, Community resources Behavioral Health Comment: Pt is working with niece and Legal Aid regarding housing concerns Major Change/Loss/Stressor/Fears (CP): Medical condition, self Techniques to [employer] with  Loss/Stress/Change: Diversional activities, Spiritual practice(s)      12/21/2023    PHQ2-9 Depression Screening   Little interest or pleasure in doing things    Feeling down, depressed, or hopeless    PHQ-2 - Total Score    Trouble falling or staying asleep, or sleeping too much    Feeling tired or having little energy    Poor appetite or overeating     Feeling bad about yourself - or that you are a failure or have let yourself or your family down    Trouble concentrating on things, such as reading the newspaper or watching television    Moving or speaking so slowly that other people could have noticed.  Or the opposite - being so fidgety or restless that you have been moving around a lot more than usual    Thoughts that you would be better off dead, or hurting yourself in some way    PHQ2-9 Total Score    If you checked off any problems, how difficult have these problems made it for you to do your work, take care of things at home, or get along with other people    Depression Interventions/Treatment      There were no vitals filed for this visit.    Medications Reviewed Today     Reviewed by Dennys Traughber D, LCSW (Social Worker) on 12/21/23 at 1409  Med List Status: <None>   Medication Order Taking? Sig Documenting Provider Last Dose Status Informant  Accu-Chek Softclix Lancets lancets [id number]  Use to check blood sugar three times daily. [name], [name], FNP  Active            Med Note ANGELICA, ROLIN D   Tue Jun 01, 2023 10:27 AM) Pt has contacted Medicaid to see if they will cover this   albuterol  (VENTOLIN  HFA) 108 (90 Base) MCG/ACT inhaler [id number]  Inhale 2 puffs into the lungs every 6 (six) hours as needed for wheezing or shortness of breath. Neysa Rama D, MD  Active   allopurinol  (ZYLOPRIM ) 100 MG tablet [id number]  Take 1 tablet (100 mg total) by mouth daily. [name], [name], FNP  Active   apixaban  (ELIQUIS ) 5 MG TABS tablet [id number]  Take 1 tablet (5 mg  total) by mouth 2 (two) times daily. [name], [name], FNP  Active   atorvastatin  (LIPITOR) 40 MG tablet [id number]  Take 1 tablet (40 mg total) by mouth daily. [name], [name], FNP  Active   Blood Glucose Monitoring Suppl (BLOOD GLUCOSE MONITOR SYSTEM) w/Device KIT [id number]  Use as instructed to check blood sugar once daily [name], [name], FNP  Active   desonide  (DESOWEN ) 0.05 % cream [id number]  Apply topically 2 (two) times daily. For dry and peeling skin. Use for 2 weeks. [name], [name], FNP  Active   diclofenac  Sodium (VOLTAREN ) 1 % GEL [id number]  Apply 2 g topically 4 (four) times daily. [name], [name], FNP  Active   empagliflozin  (JARDIANCE ) 25 MG TABS tablet [id number]  Take 1 tablet (25 mg total) by mouth daily before breakfast. [name], [name], FNP  Active   gabapentin  (NEURONTIN ) 100 MG capsule [id number]  Take 1 capsule (100 mg total) by mouth daily. [name], [name], FNP  Active   glucose blood (ACCU-CHEK GUIDE TEST) test strip [id number]  Use as instructed [name], [name], FNP  Active   insulin  glargine (LANTUS  SOLOSTAR) 100 UNIT/ML Solostar Pen [id number]  Inject 36 units into the skin once daily. May titrate up to 44 units daily if instructed by your provider.  Patient taking differently: Inject 36 units into the skin once daily. May titrate up to 44 units daily if instructed by your provider.   [name], [name], FNP  Active   Insulin  Pen Needle (PEN NEEDLES) 32G X 4 MM MISC [id number]  Use to inject Lantus  daily [name], [name], FNP  Active   metFORMIN  (GLUCOPHAGE -XR) 500 MG 24 hr tablet [id number]  Take 1 tablet (500 mg total) by mouth in the morning and at bedtime. [name], [name], FNP  Active   metoprolol  tartrate (LOPRESSOR ) 25 MG tablet [id number]  Take 0.5 tablets (12.5 mg total) by mouth 2 (two) times daily. [name], [name], FNP  Active   mometasone -formoterol  (DULERA ) 100-5 MCG/ACT AERO [id number]  Inhale 2 puffs into the lungs 2 (two)  times daily. [name], [name], FNP  Active   Phenylephrine -DM-GG (MUCINEX  FAST-MAX CONGEST COUGH) 2.5-5-100 MG/5ML LIQD [id number]  Take 20 mLs by mouth daily as needed (cough and chest congestion).  Patient not taking: Reported on 09/24/2023   [provider]  Active Self  QUEtiapine  (SEROQUEL ) 100 MG tablet [id number]  Take 1 tablet (100 mg total) by mouth 2 (two) times daily. [name], [name], FNP  Active   Semaglutide , 1 MG/DOSE, (OZEMPIC , 1 MG/DOSE,) 4 MG/3ML SOPN [id number]  Inject 1 mg into the skin once a week. [name], [name], FNP  Active   senna-docusate (SENOKOT-S) 8.6-50 MG tablet [id number]  Take 2 tablets by mouth at bedtime as needed for moderate constipation.  Patient not taking: Reported on 09/24/2023   Caleen Burgess BROCKS, MD  Active   sertraline  (ZOLOFT ) 100 MG tablet [id number]  Take 1 tablet (100 mg total) by  mouth daily. [name], [name], FNP  Active   torsemide  (DEMADEX ) 10 MG tablet [id number]  Take 1 tablet (10 mg total) by mouth daily. [name], [name], FNP  Active             Recommendation:   Continue Current Plan of Care  Follow Up Plan:   Telephone follow-up 2-4 weeks  Rolin Kerns, LCSW [facility]  [facility], [facility] Clinical Social Worker Direct Dial: [phone]  Fax: [phone] Website: [email or web address] 2:19 PM

## 2023-12-26 NOTE — Patient Instructions (Signed)
 Visit Information  [name] was given information about Medicaid Managed Care team care coordination services as a part of their [employer] Medicaid benefit.   If you would like to schedule transportation through your [facility] [facility] plan, please call the following number at least 2 days in advance of your appointment: [phone]  If you are experiencing a behavioral health crisis, call the [employer] [place]  Behavioral Health Crisis Line at [phone]). The line is available 24 hours a day, seven days a week.   Please see education materials related to topics discussed provided pt declined  The patient verbalized understanding of instructions, educational materials, and care plan provided today and DECLINED offer to receive copy of patient instructions, educational materials, and care plan.   Licensed Clinical Social Worker will call on 12/21/23  Timothy Kerns, LCSW [place]  [facility], [facility] Clinical Social Worker Direct Dial: [phone]  Fax: [phone] Website: [email or web address] 5:07 PM   Following is a copy of your plan of care:  Goals Addressed             This Visit's Progress    LCSW VBCI Social Work Care Plan   On track    Problems:   Needing to re-apply for Medicaid to cover supplies/medications  CSW Clinical Goal(s):   Over the next 60 days the Patient will attend all scheduled medical appointments as evidenced by patient report and care team review of appointment completion in electronic MEDICAL RECORD NUMBER  work with Child Psychotherapist to address concerns related to re-applying for Medicaid.  Interventions:  Mental Health:  Evaluation of current treatment plan related to re-applying for Medicaid to cover supplies/medications Active listening / Reflection utilized Emotional Support Provided Problem Solving /Task Center strategies reviewed Reviewed mental health  medications and discussed importance of compliance: Patient is taking meds as prescribed Patient obtained a meter to check blood sugars daily Medicaid and SNAP benefits were re-certified. Has Blue Card to assist with utilities/food monthly Pt endorses stress after hearing from Landlord that [employer] is $5,000 behind on his rent and he has recently lost his Section [employer] benefits. Patient is currently working with Legal Aid, to resolve housing concerns. He has been in current apt for 7 months Stress management strategies discussed    Patient Goals/Self-Care Activities:  Increase coping skills and healthy habits  Follow up with Legal Aid regarding housing concerns  Follow up with [employer] Case Worker about next steps   Plan:   Telephone follow up appointment with care management team member scheduled for:  4 weeks

## 2023-12-26 NOTE — Patient Outreach (Signed)
 Complex Care Management   Visit Note  12/08/2023  Name:  Timothy Le MRN: [id number] DOB: [birth year]  Situation: Referral received for Complex Care Management related to Mental/Behavioral Health diagnosis Depression and Anxiety I obtained verbal consent from Patient.  Visit completed with Patient  on the phone  Background:   Past Medical History:  Diagnosis Date   Asthma    Chronic respiratory failure with hypoxia (HCC) 07/09/2017   COPD (chronic obstructive pulmonary disease) (HCC)    Diabetes mellitus without complication (HCC)    Difficult intubation    Hypertension    Severe obesity (BMI >= 40) (HCC)    Shortness of breath dyspnea    Sleep apnea    Tracheobronchitis     Assessment: Patient Reported Symptoms:  Cognitive        Neurological      HEENT        Cardiovascular      Respiratory      Endocrine Endocrine Symptoms Reported: Not assessed    Gastrointestinal Gastrointestinal Symptoms Reported: Not assessed      Genitourinary Genitourinary Symptoms Reported: Not assessed    Integumentary Integumentary Symptoms Reported: Not assessed    Musculoskeletal Musculoskelatal Symptoms Reviewed: Not assessed        Psychosocial Psychosocial Symptoms Reported: Other Other Psychosocial Conditions: Stress Behavioral Management Strategies: Coping strategies Major Change/Loss/Stressor/Fears (CP): Medical condition, self, Resources      12/26/2023    PHQ2-9 Depression Screening   Little interest or pleasure in doing things    Feeling down, depressed, or hopeless    PHQ-2 - Total Score    Trouble falling or staying asleep, or sleeping too much    Feeling tired or having little energy    Poor appetite or overeating     Feeling bad about yourself - or that you are a failure or have let yourself or your family down    Trouble concentrating on things, such as reading the newspaper or watching television    Moving or speaking so slowly that other people  could have noticed.  Or the opposite - being so fidgety or restless that you have been moving around a lot more than usual    Thoughts that you would be better off dead, or hurting yourself in some way    PHQ2-9 Total Score    If you checked off any problems, how difficult have these problems made it for you to do your work, take care of things at home, or get along with other people    Depression Interventions/Treatment      There were no vitals filed for this visit.    Medications Reviewed Today   Medications were not reviewed in this encounter     Recommendation:   Continue Current Plan of Care  Follow Up Plan:   Telephone follow-up 2-4 weeks  Rolin Kerns, LCSW [place]  [facility], [facility] Clinical Social Worker Direct Dial: [phone]  Fax: [phone] Website: [email or web address] 5:07 PM

## 2024-01-07 ENCOUNTER — Other Ambulatory Visit: Payer: Self-pay

## 2024-01-07 ENCOUNTER — Other Ambulatory Visit: Payer: Self-pay | Admitting: Nurse Practitioner

## 2024-01-07 ENCOUNTER — Other Ambulatory Visit (HOSPITAL_COMMUNITY): Payer: Self-pay

## 2024-01-07 DIAGNOSIS — F418 Other specified anxiety disorders: Secondary | ICD-10-CM

## 2024-01-07 DIAGNOSIS — E1142 Type 2 diabetes mellitus with diabetic polyneuropathy: Secondary | ICD-10-CM

## 2024-01-07 DIAGNOSIS — L309 Dermatitis, unspecified: Secondary | ICD-10-CM

## 2024-01-07 MED ORDER — DESONIDE 0.05 % EX CREA
TOPICAL_CREAM | Freq: Two times a day (BID) | CUTANEOUS | 0 refills | Status: AC
  Filled 2024-01-07: qty 15, 14d supply, fill #0

## 2024-01-07 MED ORDER — SERTRALINE HCL 100 MG PO TABS
100.0000 mg | ORAL_TABLET | Freq: Every day | ORAL | 0 refills | Status: AC
  Filled 2024-01-07: qty 90, 90d supply, fill #0

## 2024-01-07 NOTE — Telephone Encounter (Signed)
 Please advise [facility]

## 2024-01-07 NOTE — Progress Notes (Signed)
 "   01/07/2024 Name: Timothy Le MRN: [id number] DOB: Mar 24, 1960  Chief Complaint  Patient presents with   Diabetes    Timothy Le is a 63 y.o. year old male who presented for a telephone visit. PMH includes HFpEF, severe OSA, HTN, COPD, chronic respiratory failure with tracheostomy (followed at [facility] tracheostomy clinic), T2DM, BMI > 50.   They were referred to the pharmacist by their PCP for assistance in managing diabetes.   Subjective: Patient was last seen by PCP, Lorice Shall, NP on 06/23/23. A1C had decreased from 6.5% to 6.1%. He reported injected 44 units of Lantus  daily along with Ozempic  0.5 mg weekly, metformin  500 mg BID, and Jardiance  25 mg daily. He was instructed to reduce Lantus  to 36 units daily to reduce risk of hypoglycemia. His Medicaid insurance still had not been updated from a long-term care plan and glucometer + supplies could not be filled. He was engaged by pharmacy via telephone on 07/07/23. At this time, he had been enrolled in a standard Medicaid plan. We renewed his PA for Jardiance  and Ozempic . He was instructed to increase Ozempic  to 1 mg weekly and decrease Lantus  to 28 units daily. His BG meter and supplies were also shipped via [facility] pharmacy. At pharmacy f/u on 08/23/23, patient reported doing well. He had continued to inject Lantus  36 units daily, but had been monitor BG with his meter and denied hypoglycemia. He saw PCP on 09/24/23 and A1C remained stable at 6.1%. At pharmacy call on 10/08/23, patient was doing well, but having some s/sx of hypoglycemia. He was instructed to reduce Lantus  to 26 units daily. At last pharmacy call on 11/12/23, there were not changes to plan.   Today, patient reports being under a lot of stress. Just lost his dad. Needs refill of Ozempic , BP medications, and desonide  cream.    Care Team: Primary Care Provider: [name], [name], FNP ; Next Scheduled Visit: 01/24/24 Pulmonology: Reggy Salt, MD: Next Scheduled Visit:  06/29/23 - did not show, needs to be rescheduled  Medication Access/Adherence  Current Pharmacy:  [facility] - [facility] [address] [place] [address] Phone: [phone] Fax: [phone]  [id number] - [place], [place] - [address] AT [facility] [address] [place] [address] Phone: [phone] Fax: [phone]   Patient reports affordability concerns with their medications: No  Patient reports access/transportation concerns to their pharmacy: No  Patient reports adherence concerns with their medications:  No    Diabetes:  Current medications: Ozempic  1 mg weekly (Saturdays), Jardiance  25 mg daily, metformin  XR 500 mg BID (taking once daily), Lantus  26 units daily   He denies GI AE on current dose of Ozempic . Did not discuss appetite in depth today.  Current BG readings: Using Accu Chek Guide meter once daily Denies BG < 70. Lowest recently is 90. FBG in the low 100s  Patient denies hypoglycemic s/sx including dizziness, shakiness, sweatiness.   Patient denies hyperglycemic symptoms including polyuria, polydipsia, polyphagia, nocturia, neuropathy, blurred vision. Reports he had an eye appt today.  Current meal patterns: 2 meals/day - Breakfast: sausage, eggs - Supper: turkey burger, salad - Snacks: He reports that he is no longer buying junk food snacks. - Drinks: water , fruit juice once a day - minutemade, coffee with cream and splenda  Physical activity: Reports that he has been walking more - to the garbage can, mail box.   Hyperlipidemia/ASCVD Risk Reduction  Current  lipid lowering medications: atorvastatin  40 mg daily  The 10-year ASCVD risk score (Arnett DK, et al., 2019) is: 22.8%   Values used to calculate the score:     Age: 74 years     Clinically relevant sex: Male     Is Non-Hispanic African American: Yes     Diabetic: Yes     Tobacco smoker: No     Systolic Blood  Pressure: 123 mmHg     Is BP treated: Yes     HDL Cholesterol: 45 mg/dL     Total Cholesterol: 145 mg/dL   Objective:  BP Readings from Last 3 Encounters:  09/24/23 (!) 123/54  08/24/23 (!) 152/64  06/23/23 134/63    Lab Results  Component Value Date   HGBA1C 6.1 (A) 09/24/2023   HGBA1C 6.1 (A) 06/23/2023   HGBA1C 6.7 (A) 12/28/2022    UACR 06/23/23: 11 mg/g (prev 34 mg/g)  Lab Results  Component Value Date   CREATININE 1.00 09/24/2023   BUN 18 09/24/2023   NA 147 (H) 09/24/2023   K 3.6 09/24/2023   CL 105 09/24/2023   CO2 27 09/24/2023    Lab Results  Component Value Date   CHOL 145 03/08/2023   HDL 45 03/08/2023   LDLCALC 82 03/08/2023   LDLDIRECT 65 06/23/2023   TRIG 96 03/08/2023   CHOLHDL 3.2 03/08/2023    Medications Reviewed Today     Reviewed by Brinda Lorain SQUIBB, RPH-CPP (Pharmacist) on 01/07/24 at 1526  Med List Status: <None>   Medication Order Taking? Sig Documenting Provider Last Dose Status Informant  Accu-Chek Softclix Lancets lancets [id number]  Use to check blood sugar three times daily. [name], [name], FNP  Active            Med Note ANGELICA, JASMINE D   Tue Jun 01, 2023 10:27 AM) Pt has contacted Medicaid to see if they will cover this   albuterol  (VENTOLIN  HFA) 108 (90 Base) MCG/ACT inhaler [id number]  Inhale 2 puffs into the lungs every 6 (six) hours as needed for wheezing or shortness of breath. Neysa Rama D, MD  Active   allopurinol  (ZYLOPRIM ) 100 MG tablet [id number]  Take 1 tablet (100 mg total) by mouth daily. [name], [name], FNP  Active   apixaban  (ELIQUIS ) 5 MG TABS tablet [id number]  Take 1 tablet (5 mg total) by mouth 2 (two) times daily. [name], [name], FNP  Active   atorvastatin  (LIPITOR) 40 MG tablet [id number]  Take 1 tablet (40 mg total) by mouth daily. [name], [name], FNP  Active   Blood Glucose Monitoring Suppl (BLOOD GLUCOSE MONITOR SYSTEM) w/Device KIT [id number]  Use as instructed to check blood sugar once daily  [name], [name], FNP  Active   desonide  (DESOWEN ) 0.05 % cream [id number]  Apply topically 2 (two) times daily. For dry and peeling skin. Use for 2 weeks. [name], [name], FNP  Active   diclofenac  Sodium (VOLTAREN ) 1 % GEL [id number]  Apply 2 g topically 4 (four) times daily. [name], [name], FNP  Active   empagliflozin  (JARDIANCE ) 25 MG TABS tablet [id number]  Take 1 tablet (25 mg total) by mouth daily before breakfast. [name], [name], FNP  Active   gabapentin  (NEURONTIN ) 100 MG capsule [id number]  Take 1 capsule (100 mg total) by mouth daily. [name], [name], FNP  Active   glucose blood (ACCU-CHEK GUIDE TEST) test strip [id number]  Use as instructed [name], [name], FNP  Active   insulin  glargine (LANTUS   SOLOSTAR) 100 UNIT/ML Solostar Pen [id number]  Inject 36 units into the skin once daily. May titrate up to 44 units daily if instructed by your provider. [name], [name], FNP  Active   Insulin  Pen Needle (PEN NEEDLES) 32G X 4 MM MISC [id number]  Use to inject Lantus  daily [name], [name], FNP  Active   metFORMIN  (GLUCOPHAGE -XR) 500 MG 24 hr tablet [id number]  Take 1 tablet (500 mg total) by mouth in the morning and at bedtime. [name], [name], FNP  Active   metoprolol  tartrate (LOPRESSOR ) 25 MG tablet [id number]  Take 0.5 tablets (12.5 mg total) by mouth 2 (two) times daily. [name], [name], FNP  Active   mometasone -formoterol  (DULERA ) 100-5 MCG/ACT AERO [id number]  Inhale 2 puffs into the lungs 2 (two) times daily. [name], [name], FNP  Active   Phenylephrine -DM-GG (MUCINEX  FAST-MAX CONGEST COUGH) 2.5-5-100 MG/5ML LIQD [id number]  Take 20 mLs by mouth daily as needed (cough and chest congestion).  Patient not taking: Reported on 09/24/2023   [provider]  Active Self  QUEtiapine  (SEROQUEL ) 100 MG tablet [id number]  Take 1 tablet (100 mg total) by mouth 2 (two) times daily. [name], [name], FNP  Active   Semaglutide , 1 MG/DOSE, (OZEMPIC , 1 MG/DOSE,) 4  MG/3ML SOPN [id number]  Inject 1 mg into the skin once a week. [name], [name], FNP  Active   senna-docusate (SENOKOT-S) 8.6-50 MG tablet [id number]  Take 2 tablets by mouth at bedtime as needed for moderate constipation.  Patient not taking: Reported on 09/24/2023   Caleen Burgess BROCKS, MD  Active   sertraline  (ZOLOFT ) 100 MG tablet [id number]  Take 1 tablet (100 mg total) by mouth daily. [name], [name], FNP  Active   torsemide  (DEMADEX ) 10 MG tablet [id number]  Take 1 tablet (10 mg total) by mouth daily. [name], [name], FNP  Active              Assessment/Plan:     Diabetes: - Currently controlled based on last A1c 6.1%, below goal <7%. Patient is a great candidate for GLP-1 given BMI >50. He is tolerating Ozempic  1 mg well. Assisted in requesting refills today. Hypoglycemia has resolved with previous decrease in basal insulin . Will continue current dose for now. Will revisit CGM at follow-up. - Reviewed long term cardiovascular and renal outcomes of uncontrolled blood sugar - Reviewed goal A1c, goal fasting, and goal 2 hour post prandial glucose - Reviewed dietary modifications including increasing intake of protein and non-starchy vegetables. Limit portion sizes of carbohydrate foods. Also discussed replacing sugary beverages with zero sugar options.  - Recommend to continue Ozempic  to 1 mg weekly.  - Recommend to continue Lantus  26 units daily.  - Recommend to continue Jardiance  25 mg daily - Recommend to continue metformin  XR 500 mg BID. Patient continues to take daily - ok to continue at this dose for now given controlled sugars. - Recommend to check fasting blood glucose daily and occasional 2-hr PPG. - Most recent UACR WNL - Next A1C due 01/24/23   Hyperlipidemia/ASCVD Risk Reduction:  Currently controlled with last direct LDL of 65 mg/dL below goal <29 mg/dL given dx of DM. Appropriate to continue high intensity statin. - Reviewed long term complications of uncontrolled  cholesterol - Reviewed dietary recommendations including cutting back saturated fats and excess carbohydrates - Recommend to continue atorvastatin  40 mg daily    Patient requests refill of desonide  0.05% cream for face peeling and dryness. Reports it is helping a lot. Discussed  that this is not typically a long term medication. Patient feels he needs to use it for a little longer. Will send refill request to PCP for review.   Follow Up Plan: PCP 01/24/24, PharmD telephone 03/03/24   Lorain Baseman, PharmD [facility] [phone]  "

## 2024-01-14 ENCOUNTER — Telehealth: Payer: Self-pay | Admitting: Licensed Clinical Social Worker

## 2024-01-14 ENCOUNTER — Encounter: Payer: Self-pay | Admitting: Licensed Clinical Social Worker

## 2024-01-14 NOTE — Patient Instructions (Signed)
 Timothy Le - I am sorry I was unable to reach you today for our scheduled appointment. I work with Timothy Le, Timothy R, FNP and am calling to support your healthcare needs. Please contact me at [phone] at your earliest convenience. I look forward to speaking with you soon.   Thank you,  Rolin Kerns, LCSW [place]  [facility], [facility] Clinical Social Worker Direct Dial: [phone]  Fax: [phone] Website: [email or web address] 1:29 PM

## 2024-01-24 ENCOUNTER — Ambulatory Visit: Payer: Self-pay | Admitting: Nurse Practitioner

## 2024-01-24 ENCOUNTER — Encounter: Payer: Self-pay | Admitting: Nurse Practitioner

## 2024-01-24 VITALS — BP 111/65 | HR 58 | Temp 97.3°F | Wt 334.0 lb

## 2024-01-24 DIAGNOSIS — Z930 Tracheostomy status: Secondary | ICD-10-CM

## 2024-01-24 DIAGNOSIS — M25562 Pain in left knee: Secondary | ICD-10-CM

## 2024-01-24 DIAGNOSIS — Z5941 Food insecurity: Secondary | ICD-10-CM | POA: Diagnosis not present

## 2024-01-24 DIAGNOSIS — E1142 Type 2 diabetes mellitus with diabetic polyneuropathy: Secondary | ICD-10-CM | POA: Diagnosis not present

## 2024-01-24 DIAGNOSIS — G8929 Other chronic pain: Secondary | ICD-10-CM

## 2024-01-24 DIAGNOSIS — E785 Hyperlipidemia, unspecified: Secondary | ICD-10-CM

## 2024-01-24 DIAGNOSIS — F39 Unspecified mood [affective] disorder: Secondary | ICD-10-CM | POA: Insufficient documentation

## 2024-01-24 DIAGNOSIS — Z0000 Encounter for general adult medical examination without abnormal findings: Secondary | ICD-10-CM | POA: Diagnosis not present

## 2024-01-24 DIAGNOSIS — I5032 Chronic diastolic (congestive) heart failure: Secondary | ICD-10-CM | POA: Diagnosis not present

## 2024-01-24 DIAGNOSIS — H547 Unspecified visual loss: Secondary | ICD-10-CM | POA: Diagnosis not present

## 2024-01-24 DIAGNOSIS — F419 Anxiety disorder, unspecified: Secondary | ICD-10-CM

## 2024-01-24 DIAGNOSIS — Z794 Long term (current) use of insulin: Secondary | ICD-10-CM

## 2024-01-24 DIAGNOSIS — J449 Chronic obstructive pulmonary disease, unspecified: Secondary | ICD-10-CM

## 2024-01-24 DIAGNOSIS — M109 Gout, unspecified: Secondary | ICD-10-CM | POA: Diagnosis not present

## 2024-01-24 DIAGNOSIS — L309 Dermatitis, unspecified: Secondary | ICD-10-CM | POA: Insufficient documentation

## 2024-01-24 NOTE — Assessment & Plan Note (Signed)
 Wt Readings from Last 3 Encounters:  01/24/24 (!) 334 lb (151.5 kg)  09/24/23 (!) 333 lb (151 kg)  08/24/23 (!) 330 lb (149.7 kg)   Body mass index is 50.78 kg/m.  Counseled on low-carb diet Encouraged moderate exercise at least 150 minutes weekly as tolerated Continue Ozempic  for type 2 diabetes

## 2024-01-24 NOTE — Progress Notes (Signed)
 "  Established Patient Office Visit  Subjective:  Patient ID: Timothy Le, male    DOB: 07/23/1960  Age: 64 y.o. MRN: [id number]  CC:  Chief Complaint  Patient presents with   Medical Management of Chronic Issues    HPI   Discussed the use of AI scribe software for clinical note transcription with the patient, who gave verbal consent to proceed.  History of Present Illness Timothy Le is a 64 year old male  has a past medical history of Asthma, Chronic respiratory failure with hypoxia (HCC) (07/09/2017), COPD (chronic obstructive pulmonary disease) (HCC), Diabetes mellitus without complication (HCC), Difficult intubation, Hypertension, Severe obesity (BMI >= 40) (HCC), Shortness of breath dyspnea, Sleep apnea, and Tracheobronchitis.  who presents for a follow-up visit for his chronic medical conditions  He manages his type 2 diabetes with metformin  500 mg twice daily, Ozempic  1 mg weekly, and Jardiance  25 mg daily. Lantus   26 units daily, resulting in stable blood sugar levels with a fasting glucose of 114 mg/dL.  Takes atorvastatin  40 mg daily for hyperlipidemia  CHF.  On torsemide  10 mg daily, metoprolol  12.5 mg twice daily, Jardiance  25 mg daily currently not established with cardiology  He has a history of depression and is currently taking Zoloft  100 mg daily and Seroquel  100 mg once daily.  He experiences left knee pain and expresses a need for a cortisone shot; he reports that a previous shot 'felt so good.' He has been using Voltaren  gel, which he finds ineffective, and takes gabapentin  100 mg daily for pain management.  He mentions a recent loss of his father, who was also diabetic and had complications leading to limb loss. This has impacted him emotionally, but he continues to manage his health actively.  He continues to follow up with the trach clinic for his tracheostomy care, although it has been a while since his last visit. He stated, 'I think I'm gonna keep this  the rest of my life,' regarding his tracheostomy.  Currently on room air at home. He also uses Dulera  inhaler twice daily and albuterol  inhaler as needed for COPD.  He denies new shortness of breath, cough, wheezing      Assessment & Plan     Past Medical History:  Diagnosis Date   Asthma    Chronic respiratory failure with hypoxia (HCC) 07/09/2017   COPD (chronic obstructive pulmonary disease) (HCC)    Diabetes mellitus without complication (HCC)    Difficult intubation    Hypertension    Severe obesity (BMI >= 40) (HCC)    Shortness of breath dyspnea    Sleep apnea    Tracheobronchitis     Past Surgical History:  Procedure Laterality Date   CORONARY ULTRASOUND/IVUS Left 12/11/2019   Procedure: Intravascular Ultrasound/IVUS;  Surgeon: Sheree Penne Bruckner, MD;  Location: [facility] INVASIVE CV LAB;  Service: Cardiovascular;  Laterality: Left;  Left lower extremity venous   IR GASTROSTOMY TUBE MOD SED  07/05/2017   IVC VENOGRAPHY N/A 01/10/2021   Procedure: IVC VENOGRAPHY - VENA CAVA;  Surgeon: Eliza Bruckner GORMAN, MD;  Location: [facility] INVASIVE CV LAB;  Service: Cardiovascular;  Laterality: N/A;   LOWER EXTREMITY VENOGRAPHY N/A 01/10/2021   Procedure: LOWER EXTREMITY VENOGRAPHY;  Surgeon: Eliza Bruckner GORMAN, MD;  Location: [facility] INVASIVE CV LAB;  Service: Cardiovascular;  Laterality: N/A;   PERIPHERAL VASCULAR THROMBECTOMY Left 12/11/2019   Procedure: PERIPHERAL VASCULAR THROMBECTOMY;  Surgeon: Sheree Penne Bruckner, MD;  Location: [facility] INVASIVE CV LAB;  Service: Cardiovascular;  Laterality: Left;   THYROIDECTOMY Left 08/30/2019   Procedure: THYROIDECTOMY CERVICAL LIPECTOMY;  Surgeon: Jesus Oliphant, MD;  Location: [facility] OR;  Service: ENT;  Laterality: Left;  NEEDS RNFA PLEASE   TRACHEOSTOMY TUBE PLACEMENT N/A 06/17/2017   Procedure: TRACHEOSTOMY;  Surgeon: Karis Clunes, MD;  Location: [facility] OR;  Service: ENT;  Laterality: N/A;   TRACHEOSTOMY TUBE PLACEMENT N/A 08/14/2019   Procedure:  TRACHEOSTOMY;  Surgeon: Jesus Oliphant, MD;  Location: [facility] OR;  Service: ENT;  Laterality: N/A;   TRACHEOSTOMY TUBE PLACEMENT N/A 08/30/2019   Procedure: TRACHEOSTOMY  REVISION;  Surgeon: Jesus Oliphant, MD;  Location: [facility] OR;  Service: ENT;  Laterality: N/A;    Family History  Problem Relation Age of Onset   Asthma Sister    Asthma Other        nephew   Hypertension Sister    Diabetes type II Sister     Social History   Socioeconomic History   Marital status: Legally Separated    Spouse name: Not on file   Number of children: 1   Years of education: Not on file   Highest education level: Not on file  Occupational History   Occupation: unemployed    Comment: working on disablity  Tobacco Use   Smoking status: Former    Current packs/day: 0.00    Average packs/day: 0.5 packs/day for 29.6 years (14.8 ttl pk-yrs)    Types: Cigarettes    Start date: 07/28/1983    Quit date: 02/27/2013    Years since quitting: 10.9   Smokeless tobacco: Never  Vaping Use   Vaping status: Never Used  Substance and Sexual Activity   Alcohol use: No    Comment: quit ETOH about 5 months ago-beer and liquor   Drug use: No    Comment: quit 6 months-pot-smoked couple joints a week   Sexual activity: Not Currently  Other Topics Concern   Not on file  Social History Narrative   Lives home alone   Social Drivers of Health   Tobacco Use: Medium Risk (01/24/2024)   Patient History    Smoking Tobacco Use: Former    Smokeless Tobacco Use: Never    Passive Exposure: Not on Actuary Strain: Not on file  Food Insecurity: Food Insecurity Present (01/24/2024)   Epic    Worried About Programme Researcher, Broadcasting/film/video in the Last Year: Sometimes true    Ran Out of Food in the Last Year: Sometimes true  Transportation Needs: No Transportation Needs (01/24/2024)   Epic    Lack of Transportation (Medical): No    Lack of Transportation (Non-Medical): No  Physical Activity: Not on file  Stress: Not on file   Social Connections: Not on file  Intimate Partner Violence: Not At Risk (01/24/2024)   Epic    Fear of Current or Ex-Partner: No    Emotionally Abused: No    Physically Abused: No    Sexually Abused: No  Depression (PHQ2-9): Low Risk (01/24/2024)   Depression (PHQ2-9)    PHQ-2 Score: 1  Alcohol Screen: Not on file  Housing: Low Risk (01/24/2024)   Epic    Unable to Pay for Housing in the Last Year: No    Number of Times Moved in the Last Year: 0    Homeless in the Last Year: No  Utilities: Not At Risk (01/24/2024)   Epic    Threatened with loss of utilities: No  Health Literacy: Not on file    Outpatient Medications Prior to  Visit  Medication Sig Dispense Refill   Accu-Chek Softclix Lancets lancets Use to check blood sugar three times daily. 100 each 3   albuterol  (VENTOLIN  HFA) 108 (90 Base) MCG/ACT inhaler Inhale 2 puffs into the lungs every 6 (six) hours as needed for wheezing or shortness of breath. 18 g 12   allopurinol  (ZYLOPRIM ) 100 MG tablet Take 1 tablet (100 mg total) by mouth daily. 90 tablet 1   apixaban  (ELIQUIS ) 5 MG TABS tablet Take 1 tablet (5 mg total) by mouth 2 (two) times daily. 180 tablet 1   atorvastatin  (LIPITOR) 40 MG tablet Take 1 tablet (40 mg total) by mouth daily. 90 tablet 3   Blood Glucose Monitoring Suppl (BLOOD GLUCOSE MONITOR SYSTEM) w/Device KIT Use as instructed to check blood sugar once daily 1 kit 0   desonide  (DESOWEN ) 0.05 % cream Apply topically 2 (two) times daily. For dry and peeling skin. Use for 2 weeks. 15 g 0   diclofenac  Sodium (VOLTAREN ) 1 % GEL Apply 2 g topically 4 (four) times daily. 150 g 5   empagliflozin  (JARDIANCE ) 25 MG TABS tablet Take 1 tablet (25 mg total) by mouth daily before breakfast. 90 tablet 3   gabapentin  (NEURONTIN ) 100 MG capsule Take 1 capsule (100 mg total) by mouth daily. 540 capsule 0   glucose blood (ACCU-CHEK GUIDE TEST) test strip Use as instructed 100 each 11   insulin  glargine (LANTUS  SOLOSTAR) 100 UNIT/ML  Solostar Pen Inject 36 units into the skin once daily. May titrate up to 44 units daily if instructed by your provider. 36 mL 3   Insulin  Pen Needle (PEN NEEDLES) 32G X 4 MM MISC Use to inject Lantus  daily 100 each 3   metFORMIN  (GLUCOPHAGE -XR) 500 MG 24 hr tablet Take 1 tablet (500 mg total) by mouth in the morning and at bedtime. 180 tablet 3   metoprolol  tartrate (LOPRESSOR ) 25 MG tablet Take 0.5 tablets (12.5 mg total) by mouth 2 (two) times daily. 90 tablet 1   mometasone -formoterol  (DULERA ) 100-5 MCG/ACT AERO Inhale 2 puffs into the lungs 2 (two) times daily. 13 g 2   QUEtiapine  (SEROQUEL ) 100 MG tablet Take 1 tablet (100 mg total) by mouth 2 (two) times daily. 180 tablet 1   Semaglutide , 1 MG/DOSE, (OZEMPIC , 1 MG/DOSE,) 4 MG/3ML SOPN Inject 1 mg into the skin once a week. 9 mL 3   sertraline  (ZOLOFT ) 100 MG tablet Take 1 tablet (100 mg total) by mouth daily. 90 tablet 0   torsemide  (DEMADEX ) 10 MG tablet Take 1 tablet (10 mg total) by mouth daily. 90 tablet 1   Phenylephrine -DM-GG (MUCINEX  FAST-MAX CONGEST COUGH) 2.5-5-100 MG/5ML LIQD Take 20 mLs by mouth daily as needed (cough and chest congestion). (Patient not taking: Reported on 01/24/2024)     senna-docusate (SENOKOT-S) 8.6-50 MG tablet Take 2 tablets by mouth at bedtime as needed for moderate constipation. (Patient not taking: Reported on 01/24/2024) 60 tablet 0   No facility-administered medications prior to visit.    Allergies[1]  ROS Review of Systems  Constitutional:  Negative for appetite change, chills, fatigue and fever.  HENT:  Negative for congestion, postnasal drip, rhinorrhea and sneezing.   Respiratory:  Negative for cough, shortness of breath and wheezing.   Cardiovascular:  Negative for chest pain, palpitations and leg swelling.  Gastrointestinal:  Negative for abdominal pain, constipation, nausea and vomiting.  Genitourinary:  Negative for difficulty urinating, dysuria, flank pain and frequency.  Musculoskeletal:   Positive for arthralgias. Negative for back pain,  joint swelling and myalgias.  Skin:  Positive for rash. Negative for color change, pallor and wound.  Neurological:  Negative for facial asymmetry, weakness, numbness and headaches.  Psychiatric/Behavioral:  Negative for behavioral problems, confusion, self-injury and suicidal ideas.       Objective:    Physical Exam Vitals and nursing note reviewed.  Constitutional:      General: He is not in acute distress.    Appearance: Normal appearance. He is obese. He is not ill-appearing, toxic-appearing or diaphoretic.  Eyes:     General: No scleral icterus.       Right eye: No discharge.        Left eye: No discharge.     Extraocular Movements: Extraocular movements intact.     Conjunctiva/sclera: Conjunctivae normal.  Cardiovascular:     Rate and Rhythm: Normal rate and regular rhythm.     Pulses: Normal pulses.     Heart sounds: Normal heart sounds. No murmur heard.    No friction rub. No gallop.  Pulmonary:     Effort: Pulmonary effort is normal. No respiratory distress.     Breath sounds: Normal breath sounds. No stridor. No wheezing, rhonchi or rales.     Comments: Tracheostomy capped. Chest:     Chest wall: No tenderness.  Abdominal:     General: There is no distension.     Palpations: Abdomen is soft.     Tenderness: There is no abdominal tenderness. There is no right CVA tenderness, left CVA tenderness or guarding.  Musculoskeletal:        General: No swelling, tenderness, deformity or signs of injury.     Right lower leg: No edema.     Left lower leg: No edema.  Skin:    General: Skin is warm and dry.     Capillary Refill: Capillary refill takes less than 2 seconds.     Coloration: Skin is not jaundiced or pale.     Findings: No bruising, erythema or lesion.  Neurological:     Mental Status: He is alert and oriented to person, place, and time.     Gait: Gait abnormal.     Comments: Uses a cane for ambulation   Psychiatric:        Mood and Affect: Mood normal.        Behavior: Behavior normal.        Thought Content: Thought content normal.        Judgment: Judgment normal.     BP 111/65   Pulse (!) 58   Temp (!) 97.3 F (36.3 C)   Wt (!) 334 lb (151.5 kg)   SpO2 95%   BMI 50.78 kg/m  Wt Readings from Last 3 Encounters:  01/24/24 (!) 334 lb (151.5 kg)  09/24/23 (!) 333 lb (151 kg)  08/24/23 (!) 330 lb (149.7 kg)    Lab Results  Component Value Date   TSH 1.68 09/09/2021   Lab Results  Component Value Date   WBC 5.0 09/24/2023   HGB 14.0 09/24/2023   HCT 44.5 09/24/2023   MCV 90 09/24/2023   PLT 228 09/24/2023   Lab Results  Component Value Date   NA 147 (H) 09/24/2023   K 3.6 09/24/2023   CO2 27 09/24/2023   GLUCOSE 95 09/24/2023   BUN 18 09/24/2023   CREATININE 1.00 09/24/2023   BILITOT 0.5 09/24/2023   ALKPHOS 126 (H) 09/24/2023   AST 21 09/24/2023   ALT 24 09/24/2023   PROT 7.0 09/24/2023  ALBUMIN  4.1 09/24/2023   CALCIUM  9.5 09/24/2023   ANIONGAP 12 09/07/2022   EGFR 85 09/24/2023   GFR 58.31 (L) 09/09/2021   Lab Results  Component Value Date   CHOL 145 03/08/2023   Lab Results  Component Value Date   HDL 45 03/08/2023   Lab Results  Component Value Date   LDLCALC 82 03/08/2023   Lab Results  Component Value Date   TRIG 96 03/08/2023   Lab Results  Component Value Date   CHOLHDL 3.2 03/08/2023   Lab Results  Component Value Date   HGBA1C 6.1 (A) 09/24/2023      Assessment & Plan:   Problem List Items Addressed This Visit       Cardiovascular and Mediastinum   Chronic diastolic CHF (congestive heart failure) (HCC)   Chronic diastolic congestive heart failure No recent cardiology follow-up. Emphasized importance of annual visits. - Referred to cardiology for annual follow-up. Continue torsemide  10 mg daily, Jardiance  25 mg daily, metoprolol  12.5 mg twice daily       Relevant Orders   Ambulatory referral to Cardiology   Basic  Metabolic Panel     Respiratory   COPD (chronic obstructive pulmonary disease) (HCC)   Chronic obstructive pulmonary disease (COPD) Continues inhaler use. Exercise encouraged for lung health. - Continue Dulera  inhaler 2 puffs twice daily. - Continue albuterol  inhaler 2 puffs every 6 hours as needed. - Encouraged moderate exercise 30 minutes, 5 days a week.         Endocrine   Type 2 diabetes mellitus with diabetic polyneuropathy, with long-term current use of insulin  (HCC) - Primary   Type 2 diabetes mellitus with diabetic polyneuropathy Blood glucose well-controlled. Emphasized importance of maintaining control to prevent complications. - Continue metformin  500 mg twice daily. - Continue Ozempic  1 mg weekly. - Continue Jardiance  25 mg daily. - Continue Lantus  26 units daily. - Ordered A1c test. - Encouraged avoidance of sugary foods and drinks. - Encouraged moderate exercise 30 minutes, 5 days a week. Up-to-date with diabetic eye exam records requested Appreciate collaboration with the clinical pharmacist       Relevant Orders   Hemoglobin A1c   Lipid panel     Musculoskeletal and Integument   Gout of both feet   Gout Continues on allopurinol  for prevention. - Continue allopurinol  100 mg daily.       Dermatitis   Dermatitis improved Continue desonide  cream as needed        Other   Tracheostomy dependence (HCC)   Tracheostomy status Continues trach clinic attendance. Acknowledged need for ongoing care. - Continue follow-up with trach clinic for ongoing care. Trache capped, does not use oxygen  No increased SOB, cough wheezing      Dyslipidemia, goal LDL below 70   Continue atorvastatin  40 mg daily Check lipid panel      Anxiety      01/24/2024   11:13 AM 09/24/2023   11:16 AM 08/24/2023   11:13 AM 06/23/2023    1:48 PM 12/28/2022   11:31 AM  Depression screen PHQ 2/9  Decreased Interest 0 0 0 0 0  Down, Depressed, Hopeless 0 0 0 0 0  PHQ - 2 Score 0 0 0  0 0  Altered sleeping 0 0 0 0 0  Tired, decreased energy 1 0 0 0 1  Change in appetite 0 0 0 0 0  Feeling bad or failure about yourself  0 0 0 0 0  Trouble concentrating 0 0 0 0  3  Moving slowly or fidgety/restless 0 0 0 0 2  Suicidal thoughts 0 0 0 0 0  PHQ-9 Score 1 0  0  0  6   Difficult doing work/chores Not difficult at all Not difficult at all Not difficult at all Not difficult at all Not difficult at all     Data saved with a previous flowsheet row definition       01/24/2024   11:11 AM 09/24/2023   11:17 AM 08/24/2023   11:14 AM 06/23/2023    1:48 PM  GAD 7 : Generalized Anxiety Score  Nervous, Anxious, on Edge 3 0 0 0  Control/stop worrying 0 0 0 0  Worry too much - different things 0 0 0 0  Trouble relaxing 1 0 0 0  Restless 0 0 0 0  Easily annoyed or irritable 0 0 0 0  Afraid - awful might happen 0 0 0 0  Total GAD 7 Score 4 0 0 0  Anxiety Difficulty Not difficult at all Not difficult at all Not difficult at all Not difficult at all   - Continue sertraline  100 mg daily. - Continue quetiapine  100 mg daily.       Morbid obesity (HCC)   Wt Readings from Last 3 Encounters:  01/24/24 (!) 334 lb (151.5 kg)  09/24/23 (!) 333 lb (151 kg)  08/24/23 (!) 330 lb (149.7 kg)   Body mass index is 50.78 kg/m.  Counseled on low-carb diet Encouraged moderate exercise at least 150 minutes weekly as tolerated Continue Ozempic  for type 2 diabetes      Chronic pain of left knee    Reports chronic pain. Previous injections effective. Voltaren  gel ineffective. Referral discussed. - Referred to orthopedics for evaluation and possible cortisone injection. - Consider use of Tylenol  and knee brace for pain management. On gabapentin  100 mg daily       Relevant Orders   Ambulatory referral to Orthopedic Surgery   Food insecurity   Food from the clinic pantry provided He is on food stamp      Impaired vision   Impaired vision Recent eye exam completed. Awaiting new glasses. -  Ensure follow-up with eye doctor for new glasses.         Health care maintenance   General Health Maintenance Discussed importance of vaccinations and screenings. - Advised to receive pneumonia vaccine at pharmacy. - Encouraged consideration of shingles vaccine. - Discussed colon cancer screening options (Cologuard or colonoscopy). Did not get flu vaccine before leaving the office, advised to get the vaccine at the pharmacy      Mood disorder   Continue Seroquel  100 mg twice daily       No orders of the defined types were placed in this encounter.   Follow-up: Return in about 4 months (around 05/23/2024) for HTN, CHF, HYPERLIPIDEMIA.    Arshia Rondon R Joslyn Ramos, FNP     [1] No Known Allergies  "

## 2024-01-24 NOTE — Assessment & Plan Note (Signed)
 General Health Maintenance Discussed importance of vaccinations and screenings. - Advised to receive pneumonia vaccine at pharmacy. - Encouraged consideration of shingles vaccine. - Discussed colon cancer screening options (Cologuard or colonoscopy). Did not get flu vaccine before leaving the office, advised to get the vaccine at the pharmacy

## 2024-01-24 NOTE — Assessment & Plan Note (Signed)
 Type 2 diabetes mellitus with diabetic polyneuropathy Blood glucose well-controlled. Emphasized importance of maintaining control to prevent complications. - Continue metformin  500 mg twice daily. - Continue Ozempic  1 mg weekly. - Continue Jardiance  25 mg daily. - Continue Lantus  26 units daily. - Ordered A1c test. - Encouraged avoidance of sugary foods and drinks. - Encouraged moderate exercise 30 minutes, 5 days a week. Up-to-date with diabetic eye exam records requested Appreciate collaboration with the clinical pharmacist

## 2024-01-24 NOTE — Assessment & Plan Note (Signed)
Continue atorvastatin 40 mg daily.  Check lipid panel

## 2024-01-24 NOTE — Assessment & Plan Note (Addendum)
" °    01/24/2024   11:13 AM 09/24/2023   11:16 AM 08/24/2023   11:13 AM 06/23/2023    1:48 PM 12/28/2022   11:31 AM  Depression screen PHQ 2/9  Decreased Interest 0 0 0 0 0  Down, Depressed, Hopeless 0 0 0 0 0  PHQ - 2 Score 0 0 0 0 0  Altered sleeping 0 0 0 0 0  Tired, decreased energy 1 0 0 0 1  Change in appetite 0 0 0 0 0  Feeling bad or failure about yourself  0 0 0 0 0  Trouble concentrating 0 0 0 0 3  Moving slowly or fidgety/restless 0 0 0 0 2  Suicidal thoughts 0 0 0 0 0  PHQ-9 Score 1 0  0  0  6   Difficult doing work/chores Not difficult at all Not difficult at all Not difficult at all Not difficult at all Not difficult at all     Data saved with a previous flowsheet row definition       01/24/2024   11:11 AM 09/24/2023   11:17 AM 08/24/2023   11:14 AM 06/23/2023    1:48 PM  GAD 7 : Generalized Anxiety Score  Nervous, Anxious, on Edge 3 0 0 0  Control/stop worrying 0 0 0 0  Worry too much - different things 0 0 0 0  Trouble relaxing 1 0 0 0  Restless 0 0 0 0  Easily annoyed or irritable 0 0 0 0  Afraid - awful might happen 0 0 0 0  Total GAD 7 Score 4 0 0 0  Anxiety Difficulty Not difficult at all Not difficult at all Not difficult at all Not difficult at all   - Continue sertraline  100 mg daily. - Continue quetiapine  100 mg daily.  "

## 2024-01-24 NOTE — Assessment & Plan Note (Signed)
 Tracheostomy status Continues trach clinic attendance. Acknowledged need for ongoing care. - Continue follow-up with trach clinic for ongoing care. Trache capped, does not use oxygen  No increased SOB, cough wheezing

## 2024-01-24 NOTE — Assessment & Plan Note (Signed)
 Chronic diastolic congestive heart failure No recent cardiology follow-up. Emphasized importance of annual visits. - Referred to cardiology for annual follow-up. Continue torsemide  10 mg daily, Jardiance  25 mg daily, metoprolol  12.5 mg twice daily

## 2024-01-24 NOTE — Assessment & Plan Note (Signed)
 Gout Continues on allopurinol  for prevention. - Continue allopurinol  100 mg daily.

## 2024-01-24 NOTE — Assessment & Plan Note (Addendum)
 Impaired vision Recent eye exam completed. Awaiting new glasses. - Ensure follow-up with eye doctor for new glasses.

## 2024-01-24 NOTE — Assessment & Plan Note (Signed)
 Continue Seroquel  100 mg twice daily

## 2024-01-24 NOTE — Patient Instructions (Addendum)
 Goal for fasting blood sugar ranges from 80 to 120 and 2 hours after any meal or at bedtime should be between 130 to 170.   1. Food insecurity (Primary)  2. Need for influenza vaccination  3. Chronic obstructive pulmonary disease, unspecified COPD type (HCC)  4. Type 2 diabetes mellitus with diabetic polyneuropathy, with long-term current use of insulin  (HCC) - Hemoglobin A1c - Lipid panel  5. Morbid obesity (HCC)  6. Dyslipidemia, goal LDL below 70  7. Chronic respiratory failure due to obstructive sleep apnea (HCC)  8. Chronic diastolic CHF (congestive heart failure) (HCC) - Ambulatory referral to Cardiology - Basic Metabolic Panel  9. Chronic pain of left knee - Ambulatory referral to Orthopedic Surgery    It is important that you exercise regularly at least 30 minutes 5 times a week as tolerated  Think about what you will eat, plan ahead. Choose  clean, green, fresh or frozen over canned, processed or packaged foods which are more sugary, salty and fatty. 70 to 75% of food eaten should be vegetables and fruit. Three meals at set times with snacks allowed between meals, but they must be fruit or vegetables. Aim to eat over a 12 hour period , example 7 am to 7 pm, and STOP after  your last meal of the day. Drink water ,generally about 64 ounces per day, no other drink is as healthy. Fruit juice is best enjoyed in a healthy way, by EATING the fruit.  Thanks for choosing [facility] we consider it a privelige to serve you.

## 2024-01-24 NOTE — Assessment & Plan Note (Signed)
" °  Reports chronic pain. Previous injections effective. Voltaren  gel ineffective. Referral discussed. - Referred to orthopedics for evaluation and possible cortisone injection. - Consider use of Tylenol  and knee brace for pain management. On gabapentin  100 mg daily  "

## 2024-01-24 NOTE — Assessment & Plan Note (Signed)
 Chronic obstructive pulmonary disease (COPD) Continues inhaler use. Exercise encouraged for lung health. - Continue Dulera  inhaler 2 puffs twice daily. - Continue albuterol  inhaler 2 puffs every 6 hours as needed. - Encouraged moderate exercise 30 minutes, 5 days a week.

## 2024-01-24 NOTE — Assessment & Plan Note (Addendum)
 Food from the clinic pantry provided He is on food stamp

## 2024-01-24 NOTE — Assessment & Plan Note (Signed)
 Dermatitis improved Continue desonide  cream as needed

## 2024-01-25 ENCOUNTER — Other Ambulatory Visit: Payer: Self-pay

## 2024-01-25 ENCOUNTER — Ambulatory Visit: Payer: Self-pay | Admitting: Nurse Practitioner

## 2024-01-25 ENCOUNTER — Other Ambulatory Visit (HOSPITAL_COMMUNITY): Payer: Self-pay

## 2024-01-25 LAB — BASIC METABOLIC PANEL WITH GFR
BUN/Creatinine Ratio: 22 (ref 10–24)
BUN: 27 mg/dL (ref 8–27)
CO2: 24 mmol/L (ref 20–29)
Calcium: 9.6 mg/dL (ref 8.6–10.2)
Chloride: 103 mmol/L (ref 96–106)
Creatinine, Ser: 1.22 mg/dL (ref 0.76–1.27)
Glucose: 87 mg/dL (ref 70–99)
Potassium: 4.3 mmol/L (ref 3.5–5.2)
Sodium: 145 mmol/L — ABNORMAL HIGH (ref 134–144)
eGFR: 67 mL/min/1.73

## 2024-01-25 LAB — LIPID PANEL
Chol/HDL Ratio: 3.5 ratio (ref 0.0–5.0)
Cholesterol, Total: 124 mg/dL (ref 100–199)
HDL: 35 mg/dL — ABNORMAL LOW
LDL Chol Calc (NIH): 73 mg/dL (ref 0–99)
Triglycerides: 81 mg/dL (ref 0–149)
VLDL Cholesterol Cal: 16 mg/dL (ref 5–40)

## 2024-01-25 LAB — HEMOGLOBIN A1C
Est. average glucose Bld gHb Est-mCnc: 148 mg/dL
Hgb A1c MFr Bld: 6.8 % — ABNORMAL HIGH (ref 4.8–5.6)

## 2024-01-25 MED ORDER — ATORVASTATIN CALCIUM 80 MG PO TABS
80.0000 mg | ORAL_TABLET | Freq: Every day | ORAL | 3 refills | Status: AC
  Filled 2024-01-25: qty 90, 90d supply, fill #0

## 2024-01-25 MED ORDER — SEMAGLUTIDE (2 MG/DOSE) 8 MG/3ML ~~LOC~~ SOPN
2.0000 mg | PEN_INJECTOR | SUBCUTANEOUS | 3 refills | Status: AC
  Filled 2024-01-25: qty 3, 28d supply, fill #0

## 2024-01-25 NOTE — Addendum Note (Signed)
 Addended by: JUANICE LORICE SAUNDERS on: 01/25/2024 09:15 AM   Modules accepted: Orders

## 2024-02-14 ENCOUNTER — Emergency Department (HOSPITAL_COMMUNITY)

## 2024-02-14 ENCOUNTER — Encounter (HOSPITAL_COMMUNITY): Payer: Self-pay

## 2024-02-14 ENCOUNTER — Other Ambulatory Visit: Payer: Self-pay

## 2024-02-14 ENCOUNTER — Inpatient Hospital Stay (HOSPITAL_COMMUNITY)
Admission: EM | Admit: 2024-02-14 | Discharge: 2024-02-18 | Disposition: A | Discharge status: Home / Self Care | Attending: Internal Medicine | Admitting: Internal Medicine

## 2024-02-14 DIAGNOSIS — E785 Hyperlipidemia, unspecified: Secondary | ICD-10-CM | POA: Diagnosis present

## 2024-02-14 DIAGNOSIS — E119 Type 2 diabetes mellitus without complications: Secondary | ICD-10-CM

## 2024-02-14 DIAGNOSIS — R0902 Hypoxemia: Secondary | ICD-10-CM

## 2024-02-14 DIAGNOSIS — Z930 Tracheostomy status: Secondary | ICD-10-CM

## 2024-02-14 DIAGNOSIS — J189 Pneumonia, unspecified organism: Principal | ICD-10-CM

## 2024-02-14 DIAGNOSIS — I5032 Chronic diastolic (congestive) heart failure: Secondary | ICD-10-CM | POA: Diagnosis present

## 2024-02-14 DIAGNOSIS — J9621 Acute and chronic respiratory failure with hypoxia: Secondary | ICD-10-CM | POA: Diagnosis present

## 2024-02-14 DIAGNOSIS — J441 Chronic obstructive pulmonary disease with (acute) exacerbation: Secondary | ICD-10-CM | POA: Diagnosis present

## 2024-02-14 DIAGNOSIS — Z8739 Personal history of other diseases of the musculoskeletal system and connective tissue: Secondary | ICD-10-CM

## 2024-02-14 DIAGNOSIS — E1142 Type 2 diabetes mellitus with diabetic polyneuropathy: Secondary | ICD-10-CM

## 2024-02-14 DIAGNOSIS — B338 Other specified viral diseases: Secondary | ICD-10-CM

## 2024-02-14 DIAGNOSIS — F419 Anxiety disorder, unspecified: Secondary | ICD-10-CM | POA: Diagnosis present

## 2024-02-14 DIAGNOSIS — J121 Respiratory syncytial virus pneumonia: Secondary | ICD-10-CM | POA: Diagnosis present

## 2024-02-14 DIAGNOSIS — M109 Gout, unspecified: Secondary | ICD-10-CM

## 2024-02-14 DIAGNOSIS — F418 Other specified anxiety disorders: Secondary | ICD-10-CM

## 2024-02-14 DIAGNOSIS — I10 Essential (primary) hypertension: Secondary | ICD-10-CM | POA: Diagnosis present

## 2024-02-14 DIAGNOSIS — I5033 Acute on chronic diastolic (congestive) heart failure: Secondary | ICD-10-CM

## 2024-02-14 DIAGNOSIS — Z86718 Personal history of other venous thrombosis and embolism: Secondary | ICD-10-CM

## 2024-02-14 DIAGNOSIS — R001 Bradycardia, unspecified: Secondary | ICD-10-CM

## 2024-02-14 LAB — COMPREHENSIVE METABOLIC PANEL WITH GFR
ALT: 29 U/L (ref 0–44)
AST: 36 U/L (ref 15–41)
Albumin: 3.8 g/dL (ref 3.5–5.0)
Alkaline Phosphatase: 136 U/L — ABNORMAL HIGH (ref 38–126)
Anion gap: 14 (ref 5–15)
BUN: 17 mg/dL (ref 8–23)
CO2: 24 mmol/L (ref 22–32)
Calcium: 8.9 mg/dL (ref 8.9–10.3)
Chloride: 99 mmol/L (ref 98–111)
Creatinine, Ser: 1.01 mg/dL (ref 0.61–1.24)
GFR, Estimated: >60 mL/min
Glucose, Bld: 136 mg/dL — ABNORMAL HIGH (ref 70–99)
Potassium: 3.9 mmol/L (ref 3.5–5.1)
Sodium: 137 mmol/L (ref 135–145)
Total Bilirubin: 1.2 mg/dL (ref 0.0–1.2)
Total Protein: 7.8 g/dL (ref 6.5–8.1)

## 2024-02-14 LAB — CBC
HCT: 47.3 % (ref 39.0–52.0)
Hemoglobin: 14.9 g/dL (ref 13.0–17.0)
MCH: 28 pg (ref 26.0–34.0)
MCHC: 31.5 g/dL (ref 30.0–36.0)
MCV: 88.7 fL (ref 80.0–100.0)
Platelets: 184 10*3/uL (ref 150–400)
RBC: 5.33 MIL/uL (ref 4.22–5.81)
RDW: 15.4 % (ref 11.5–15.5)
WBC: 6.5 10*3/uL (ref 4.0–10.5)
nRBC: 0 % (ref 0.0–0.2)

## 2024-02-14 LAB — I-STAT VENOUS BLOOD GAS, ED
Acid-Base Excess: 4 mmol/L — ABNORMAL HIGH (ref 0.0–2.0)
Bicarbonate: 27.7 mmol/L (ref 20.0–28.0)
Calcium, Ion: 1.05 mmol/L — ABNORMAL LOW (ref 1.15–1.40)
HCT: 46 % (ref 39.0–52.0)
Hemoglobin: 15.6 g/dL (ref 13.0–17.0)
O2 Saturation: 97 %
Potassium: 3.9 mmol/L (ref 3.5–5.1)
Sodium: 139 mmol/L (ref 135–145)
TCO2: 29 mmol/L (ref 22–32)
pCO2, Ven: 36.8 mmHg — ABNORMAL LOW (ref 44–60)
pH, Ven: 7.484 — ABNORMAL HIGH (ref 7.25–7.43)
pO2, Ven: 79 mmHg — ABNORMAL HIGH (ref 32–45)

## 2024-02-14 LAB — TROPONIN T, HIGH SENSITIVITY: Troponin T High Sensitivity: 20 ng/L — ABNORMAL HIGH (ref 0–19)

## 2024-02-14 LAB — RESP PANEL BY RT-PCR (RSV, FLU A&B, COVID)  RVPGX2
Influenza A by PCR: NEGATIVE
Influenza B by PCR: NEGATIVE
Resp Syncytial Virus by PCR: POSITIVE — AB
SARS Coronavirus 2 by RT PCR: NEGATIVE

## 2024-02-14 LAB — PRO BRAIN NATRIURETIC PEPTIDE: Pro Brain Natriuretic Peptide: 168 pg/mL

## 2024-02-14 NOTE — ED Provider Notes (Incomplete)
 " [place] [facility] Provider Note   CSN: [id number] Arrival date & time: 02/14/24  2216     Patient presents with: Respiratory Distress   Timothy Le is a 64 y.o. male with history of chronic respiratory failure with hypoxia, asthma, COPD, diabetes, hypertension, tracheobronchitis.  Presents to ED complaining of chest pain, shortness of breath.  Reports all of his symptoms have been ongoing for the last few days.  Reports cough, URI symptoms, chest pain and shortness of breath.  States that whenever he ambulates he becomes very dyspneic and short of breath and diaphoretic and when he sits down all this resolves.  He states he has persistent chest pain that worsens when he ambulates.  He endorses lightheadedness and dizziness on standing.  Denies nausea, vomiting or known fevers.  He reports he has left-sided leg swelling but reports this is baseline and due to a blood clot in his left leg which she is taking blood thinners for.  He denies any change to his leg swelling tonight.  Denies any right leg swelling.  Patient also endorsing nausea and vomiting for the last 2 days, states he is unable to keep anything down including medications.  Reports he has not taken any medications for this but does go on to state that his symptoms in terms of chest pain and shortness of breath began before nausea and vomiting. Denies any abdominal pain or diarrhea.  Denies any current nausea.  Denies any known sick contacts.  HPI     Prior to Admission medications  Medication Sig Start Date End Date Taking? Authorizing Provider  Accu-Chek Softclix Lancets lancets Use to check blood sugar three times daily. 02/11/23   [name], [name], FNP  albuterol  (VENTOLIN  HFA) 108 (90 Base) MCG/ACT inhaler Inhale 2 puffs into the lungs every 6 (six) hours as needed for wheezing or shortness of breath. 03/09/23   Neysa Rama D, MD  allopurinol  (ZYLOPRIM ) 100 MG tablet Take 1 tablet  (100 mg total) by mouth daily. 09/24/23   [name], [name], FNP  apixaban  (ELIQUIS ) 5 MG TABS tablet Take 1 tablet (5 mg total) by mouth 2 (two) times daily. 09/13/23   [name], [name], FNP  atorvastatin  (LIPITOR) 80 MG tablet Take 1 tablet (80 mg total) by mouth daily. 01/25/24   [name], [name], FNP  Blood Glucose Monitoring Suppl (BLOOD GLUCOSE MONITOR SYSTEM) w/Device KIT Use as instructed to check blood sugar once daily 06/23/23   [name], [name], FNP  desonide  (DESOWEN ) 0.05 % cream Apply topically 2 (two) times daily. For dry and peeling skin. Use for 2 weeks. 01/07/24   [name], [name], FNP  diclofenac  Sodium (VOLTAREN ) 1 % GEL Apply 2 g topically 4 (four) times daily. 09/24/23   [name], [name], FNP  empagliflozin  (JARDIANCE ) 25 MG TABS tablet Take 1 tablet (25 mg total) by mouth daily before breakfast. 06/23/23   [name], [name], FNP  gabapentin  (NEURONTIN ) 100 MG capsule Take 1 capsule (100 mg total) by mouth daily. 02/15/23   [name], [name], FNP  glucose blood (ACCU-CHEK GUIDE TEST) test strip Use as instructed 10/08/23   [name], [name], FNP  insulin  glargine (LANTUS  SOLOSTAR) 100 UNIT/ML Solostar Pen Inject 36 units into the skin once daily. May titrate up to 44 units daily if instructed by your provider. 08/23/23   [name], [name], FNP  Insulin  Pen Needle (PEN NEEDLES) 32G X 4 MM MISC Use to inject Lantus  daily 09/24/23  [name], [name], FNP  metFORMIN  (GLUCOPHAGE -XR) 500 MG 24 hr tablet Take 1 tablet (500 mg total) by mouth in the morning and at bedtime. 09/24/23   [name], [name], FNP  metoprolol  tartrate (LOPRESSOR ) 25 MG tablet Take 0.5 tablets (12.5 mg total) by mouth 2 (two) times daily. 09/13/23   [name], [name], FNP  mometasone -formoterol  (DULERA ) 100-5 MCG/ACT AERO Inhale 2 puffs into the lungs 2 (two) times daily. 09/24/23   [name], [name], FNP  Phenylephrine -DM-GG (MUCINEX  FAST-MAX CONGEST COUGH) 2.5-5-100 MG/5ML LIQD Take 20 mLs by mouth  daily as needed (cough and chest congestion). Patient not taking: Reported on 01/24/2024    [provider]  QUEtiapine  (SEROQUEL ) 100 MG tablet Take 1 tablet (100 mg total) by mouth 2 (two) times daily. 09/13/23   [name], [name], FNP  Semaglutide , 2 MG/DOSE, 8 MG/3ML SOPN Inject 2 mg into the skin once a week. 01/25/24   [name], [name], FNP  senna-docusate (SENOKOT-S) 8.6-50 MG tablet Take 2 tablets by mouth at bedtime as needed for moderate constipation. Patient not taking: Reported on 01/24/2024 01/11/21   Caleen Burgess BROCKS, MD  sertraline  (ZOLOFT ) 100 MG tablet Take 1 tablet (100 mg total) by mouth daily. 01/07/24   [name], [name], FNP  torsemide  (DEMADEX ) 10 MG tablet Take 1 tablet (10 mg total) by mouth daily. 09/13/23   [name], [name], FNP    Allergies: Patient has no known allergies.    Review of Systems  Updated Vital Signs There were no vitals taken for this visit.  Physical Exam  (all labs ordered are listed, but only abnormal results are displayed) Labs Reviewed  RESP PANEL BY RT-PCR (RSV, FLU A&B, COVID)  RVPGX2  CBC  BASIC METABOLIC PANEL WITH GFR  PRO BRAIN NATRIURETIC PEPTIDE  I-STAT VENOUS BLOOD GAS, ED  TROPONIN T, HIGH SENSITIVITY    EKG: None  Radiology: No results found.  {Document cardiac monitor, telemetry assessment procedure when appropriate:32947} Procedures   Medications Ordered in the ED - No data to display    {Click here for ABCD2, HEART and other calculators REFRESH Note before signing:1}                              Medical Decision Making Amount and/or Complexity of Data Reviewed Labs: ordered. Radiology: ordered. ECG/medicine tests: ordered.   ***  {Document critical care time when appropriate  Document review of labs and clinical decision tools ie CHADS2VASC2, etc  Document your independent review of radiology images and any outside records  Document your discussion with family members, caretakers and with  consultants  Document social determinants of health affecting pt's care  Document your decision making why or why not admission, treatments were needed:32947:::1}   Final diagnoses:  None    ED Discharge Orders     None        "

## 2024-02-14 NOTE — ED Triage Notes (Signed)
 Per ems pt stated he has been having cold symptoms for 3 days and gotten worst today. Pt was 90% on RA and increased after 5mg  of Albuterol , 0.5mg  Atrovent , and 2mg  of Mag IV. EMS sts he has hx of CHF and COPD. Pt also has a trachea. Pt sts he also has cp and increases when he walks around. Pt appeared to have Afib in the 80s, but unknown hx of afib

## 2024-02-14 NOTE — ED Provider Notes (Signed)
 " [place] [facility] Provider Note   CSN: [id number] Arrival date & time: 02/14/24  2216     Patient presents with: Respiratory Distress   Timothy Le is a 64 y.o. male with history of chronic respiratory failure with hypoxia, asthma, COPD, diabetes, hypertension, tracheobronchitis.  Presents to ED complaining of chest pain, shortness of breath.  Reports all of his symptoms have been ongoing for the last few days.  Reports cough, URI symptoms, chest pain and shortness of breath.  States that whenever he ambulates he becomes very dyspneic and short of breath and diaphoretic and when he sits down all this resolves.  He states he has persistent chest pain that worsens when he ambulates.  He endorses lightheadedness and dizziness on standing.  Denies nausea, vomiting or known fevers.  He reports he has left-sided leg swelling but reports this is baseline and due to a blood clot in his left leg which she is taking blood thinners for.  He denies any change to his leg swelling tonight.  Denies any right leg swelling.  Patient also endorsing nausea and vomiting for the last 2 days, states he is unable to keep anything down including medications.  Reports he has not taken any medications for this but does go on to state that his symptoms in terms of chest pain and shortness of breath began before nausea and vomiting. Denies any abdominal pain or diarrhea.  Denies any current nausea.  Denies any known sick contacts.  HPI     Prior to Admission medications  Medication Sig Start Date End Date Taking? Authorizing Provider  Accu-Chek Softclix Lancets lancets Use to check blood sugar three times daily. 02/11/23   [name], [name], FNP  albuterol  (VENTOLIN  HFA) 108 (90 Base) MCG/ACT inhaler Inhale 2 puffs into the lungs every 6 (six) hours as needed for wheezing or shortness of breath. 03/09/23   Neysa Reggy BIRCH, MD  allopurinol  (ZYLOPRIM ) 100 MG tablet Take 1 tablet  (100 mg total) by mouth daily. 09/24/23   [name], [name], FNP  apixaban  (ELIQUIS ) 5 MG TABS tablet Take 1 tablet (5 mg total) by mouth 2 (two) times daily. 09/13/23   [name], [name], FNP  atorvastatin  (LIPITOR) 80 MG tablet Take 1 tablet (80 mg total) by mouth daily. 01/25/24   [name], [name], FNP  Blood Glucose Monitoring Suppl (BLOOD GLUCOSE MONITOR SYSTEM) w/Device KIT Use as instructed to check blood sugar once daily 06/23/23   [name], [name], FNP  desonide  (DESOWEN ) 0.05 % cream Apply topically 2 (two) times daily. For dry and peeling skin. Use for 2 weeks. 01/07/24   [name], [name], FNP  diclofenac  Sodium (VOLTAREN ) 1 % GEL Apply 2 g topically 4 (four) times daily. 09/24/23   [name], [name], FNP  empagliflozin  (JARDIANCE ) 25 MG TABS tablet Take 1 tablet (25 mg total) by mouth daily before breakfast. 06/23/23   [name], [name], FNP  gabapentin  (NEURONTIN ) 100 MG capsule Take 1 capsule (100 mg total) by mouth daily. 02/15/23   [name], [name], FNP  glucose blood (ACCU-CHEK GUIDE TEST) test strip Use as instructed 10/08/23   [name], [name], FNP  insulin  glargine (LANTUS  SOLOSTAR) 100 UNIT/ML Solostar Pen Inject 36 units into the skin once daily. May titrate up to 44 units daily if instructed by your provider. 08/23/23   [name], [name], FNP  Insulin  Pen Needle (PEN NEEDLES) 32G X 4 MM MISC Use to inject Lantus  daily 09/24/23  [name], [name], FNP  metFORMIN  (GLUCOPHAGE -XR) 500 MG 24 hr tablet Take 1 tablet (500 mg total) by mouth in the morning and at bedtime. 09/24/23   [name], [name], FNP  metoprolol  tartrate (LOPRESSOR ) 25 MG tablet Take 0.5 tablets (12.5 mg total) by mouth 2 (two) times daily. 09/13/23   [name], [name], FNP  mometasone -formoterol  (DULERA ) 100-5 MCG/ACT AERO Inhale 2 puffs into the lungs 2 (two) times daily. 09/24/23   [name], [name], FNP  Phenylephrine -DM-GG (MUCINEX  FAST-MAX CONGEST COUGH) 2.5-5-100 MG/5ML LIQD Take 20 mLs by mouth  daily as needed (cough and chest congestion). Patient not taking: Reported on 01/24/2024    [provider]  QUEtiapine  (SEROQUEL ) 100 MG tablet Take 1 tablet (100 mg total) by mouth 2 (two) times daily. 09/13/23   [name], [name], FNP  Semaglutide , 2 MG/DOSE, 8 MG/3ML SOPN Inject 2 mg into the skin once a week. 01/25/24   [name], [name], FNP  senna-docusate (SENOKOT-S) 8.6-50 MG tablet Take 2 tablets by mouth at bedtime as needed for moderate constipation. Patient not taking: Reported on 01/24/2024 01/11/21   Caleen Burgess BROCKS, MD  sertraline  (ZOLOFT ) 100 MG tablet Take 1 tablet (100 mg total) by mouth daily. 01/07/24   [name], [name], FNP  torsemide  (DEMADEX ) 10 MG tablet Take 1 tablet (10 mg total) by mouth daily. 09/13/23   [name], [name], FNP    Allergies: Patient has no known allergies.    Review of Systems  Respiratory:  Positive for shortness of breath.   All other systems reviewed and are negative.   Updated Vital Signs BP 110/89   Pulse 81   Temp (!) 100.7 F (38.2 C) (Oral)   Resp (!) 22   Ht 5' 8 (1.727 m)   Wt (!) 152 kg   SpO2 100%   BMI 50.94 kg/m   Physical Exam Vitals and nursing note reviewed.  Constitutional:      General: He is not in acute distress.    Appearance: He is well-developed.  HENT:     Head: Normocephalic and atraumatic.  Eyes:     Conjunctiva/sclera: Conjunctivae normal.  Cardiovascular:     Rate and Rhythm: Normal rate and regular rhythm.     Heart sounds: No murmur heard. Pulmonary:     Effort: Pulmonary effort is normal. No respiratory distress.     Breath sounds: Rhonchi and rales present.  Abdominal:     Palpations: Abdomen is soft.     Tenderness: There is no abdominal tenderness.  Musculoskeletal:        General: No swelling.     Cervical back: Neck supple.  Skin:    General: Skin is warm and dry.     Capillary Refill: Capillary refill takes less than 2 seconds.  Neurological:     Mental Status: He is  alert.  Psychiatric:        Mood and Affect: Mood normal.     (all labs ordered are listed, but only abnormal results are displayed) Labs Reviewed  RESP PANEL BY RT-PCR (RSV, FLU A&B, COVID)  RVPGX2 - Abnormal; Notable for the following components:      Result Value   Resp Syncytial Virus by PCR POSITIVE (*)    All other components within normal limits  COMPREHENSIVE METABOLIC PANEL WITH GFR - Abnormal; Notable for the following components:   Glucose, Bld 136 (*)    Alkaline Phosphatase 136 (*)    All other components within normal limits  I-STAT  VENOUS BLOOD GAS, ED - Abnormal; Notable for the following components:   pH, Ven 7.484 (*)    pCO2, Ven 36.8 (*)    pO2, Ven 79 (*)    Acid-Base Excess 4.0 (*)    Calcium , Ion 1.05 (*)    All other components within normal limits  TROPONIN T, HIGH SENSITIVITY - Abnormal; Notable for the following components:   Troponin T High Sensitivity 20 (*)    All other components within normal limits  CBC  PRO BRAIN NATRIURETIC PEPTIDE  CBC WITH DIFFERENTIAL/PLATELET  COMPREHENSIVE METABOLIC PANEL WITH GFR  MAGNESIUM   PROCALCITONIN  TROPONIN T, HIGH SENSITIVITY    EKG: None  Radiology: CT Angio Chest PE W and/or Wo Contrast Result Date: 02/15/2024 EXAM: CTA of the Chest without and with IV contrast for PE 02/15/2024 12:33:10 AM TECHNIQUE: CTA of the chest was performed after the administration of 75 mL of iohexol  (OMNIPAQUE ) 350 MG/ML injection. Multiplanar reformatted images are provided for review. MIP images are provided for review. Automated exposure control, iterative reconstruction, and/or weight based adjustment of the mA/kV was utilized to reduce the radiation dose to as low as reasonably achievable. COMPARISON: 01/07/2021, chest x-ray from the previous day. CLINICAL HISTORY: Cold symptoms for several days with shortness of breath. FINDINGS: PULMONARY ARTERIES: No filling defect is noted to suggest pulmonary embolism. The pulmonary artery  shows a normal branching pattern bilaterally. Main pulmonary artery is normal in caliber. MEDIASTINUM: The heart and pericardium demonstrate no acute abnormality. Tracheostomy tube is noted in satisfactory position. The right lobe of the thyroid  is prominent. The left lobe has been surgically removed. The aorta demonstrates mild atherosclerotic calcification. No aneurysmal dilatation or dissection is noted. LYMPH NODES: No mediastinal, hilar or axillary lymphadenopathy. LUNGS AND PLEURA: Lungs are well aerated bilaterally. Patchy infiltrate is noted in the right lower lobe without associated effusion. No parenchymal nodules are noted. No pneumothorax. UPPER ABDOMEN: The visualized upper abdomen is within normal limits. SOFT TISSUES AND BONES: Degenerative changes of the thoracic spine are noted. No rib abnormality is noted. No acute soft tissue abnormality. IMPRESSION: 1. No pulmonary embolism. 2. Patchy infiltrate in the right lower lobe without associated effusion. Electronically signed by: Oneil Devonshire MD 02/15/2024 12:44 AM EST RP Workstation: [id number]   DG Chest Portable 1 View Result Date: 02/14/2024 EXAM: 1 VIEW XRAY OF THE CHEST 02/14/2024 10:53:00 PM COMPARISON: Chest x-ray 09/07/2022, CT chest 01/07/2021. CLINICAL HISTORY: Shortness of breath, chest pain. FINDINGS: LINES, TUBES AND DEVICES: Tracheostomy in place. LUNGS AND PLEURA: Question: Left basilar airspace opacity. No pleural effusion. No pneumothorax. HEART AND MEDIASTINUM: Following cardiac silhouette likely due to AP orbital technique. Otherwise unremarkable mediastinal silhouette. BONES AND SOFT TISSUES: No acute osseous abnormality. IMPRESSION: 1. Question left basilar airspace opacity. Recommend further evaluation with chest x-ray PA and lateral views. 2. Tracheostomy tube in place. Electronically signed by: Morgane Naveau MD 02/14/2024 10:57 PM EST RP Workstation: [id number]    .Critical Care  Performed by: Ruthell Lonni FALCON,  PA-C Authorized by: Ruthell Lonni FALCON, PA-C   Critical care provider statement:    Critical care time (minutes):  55   Critical care time was exclusive of:  Separately billable procedures and treating other patients   Critical care was necessary to treat or prevent imminent or life-threatening deterioration of the following conditions:  Respiratory failure   Critical care was time spent personally by me on the following activities:  Blood draw for specimens, development of treatment plan with patient or surrogate, discussions  with consultants, discussions with primary provider, evaluation of patient's response to treatment, examination of patient, ordering and performing treatments and interventions, ordering and review of laboratory studies, ordering and review of radiographic studies, pulse oximetry, re-evaluation of patient's condition, review of old charts, obtaining history from patient or surrogate and interpretation of cardiac output measurements   I assumed direction of critical care for this patient from another provider in my specialty: no     Care discussed with: admitting provider      Medications Ordered in the ED  azithromycin  (ZITHROMAX ) tablet 500 mg (has no administration in time range)  acetaminophen  (TYLENOL ) tablet 650 mg (has no administration in time range)    Or  acetaminophen  (TYLENOL ) suppository 650 mg (has no administration in time range)  melatonin tablet 3 mg (has no administration in time range)  ondansetron  (ZOFRAN ) injection 4 mg (has no administration in time range)  guaiFENesin  (MUCINEX ) 12 hr tablet 600 mg (has no administration in time range)  benzonatate  (TESSALON ) capsule 200 mg (has no administration in time range)  cefTRIAXone  (ROCEPHIN ) 1 g in sodium chloride  0.9 % 100 mL IVPB (has no administration in time range)  iohexol  (OMNIPAQUE ) 350 MG/ML injection 75 mL (75 mLs Intravenous Contrast Given 02/15/24 0026)  cefTRIAXone  (ROCEPHIN ) 1 g in sodium  chloride 0.9 % 100 mL IVPB (0 g Intravenous Stopped 02/15/24 0218)  acetaminophen  (TYLENOL ) tablet 650 mg (650 mg Oral Given 02/15/24 0234)    Medical Decision Making Amount and/or Complexity of Data Reviewed Labs: ordered. Radiology: ordered. ECG/medicine tests: ordered.  Risk OTC drugs. Prescription drug management. Decision regarding hospitalization.   This is a 64 year old male presenting to the ED due to shortness of breath, URI symptoms.  On exam, HD stable.  Lung sounds with rhonchi, Rales throughout.  Patient with oxygen saturation 90% room air.  Abdomen soft and compressible.  Neuroexam at baseline.  No obvious edema to bilateral lower extremities.  Patient arrives with nonrebreather.  Oxygen saturation on nonrebreather 100%.  Speaking in full sentences.  Will assess with CBC, CMP, BNP, troponin x 2, i-STAT VBG, viral panel, chest x-ray.  Will also add on CTA as patient reports that he is currently anticoagulated for left lower extremity DVT.  Patient received DuoNeb with EMS, Solu-Medrol .  CBC without leukocytosis or anemia.  Metabolic panel is grossly unremarkable, alk phos is 136, no other electrolyte derangement, anion gap 14.  BNP not elevated at 168.  Troponin 30, delta 19.  EKG is nonischemic.  Viral panel positive for RSV.  CT reveals right lower lobe pneumonia.  Started on broad-spectrum antibiotics at this time, azithromycin  and ceftriaxone .  Patient admitted at this time to hospitalist for community-acquired pneumonia with resulting hypoxia.  Patient amenable to plan. Discussed with Dr. Marcene, [facility], who agrees to admit patient.     Final diagnoses:  Community acquired pneumonia of right lower lobe of lung  Infection due to respiratory syncytial virus (RSV), unspecified infection type  Hypoxia    ED Discharge Orders     None          Ruthell Lonni JULIANNA DEVONNA 02/15/24 0359    Lorette Mayo, MD 02/15/24 [phone]  "

## 2024-02-15 ENCOUNTER — Emergency Department (HOSPITAL_COMMUNITY)

## 2024-02-15 DIAGNOSIS — G4733 Obstructive sleep apnea (adult) (pediatric): Secondary | ICD-10-CM | POA: Diagnosis not present

## 2024-02-15 DIAGNOSIS — J9621 Acute and chronic respiratory failure with hypoxia: Secondary | ICD-10-CM | POA: Diagnosis not present

## 2024-02-15 DIAGNOSIS — J9620 Acute and chronic respiratory failure, unspecified whether with hypoxia or hypercapnia: Secondary | ICD-10-CM

## 2024-02-15 DIAGNOSIS — J121 Respiratory syncytial virus pneumonia: Secondary | ICD-10-CM | POA: Diagnosis present

## 2024-02-15 DIAGNOSIS — E119 Type 2 diabetes mellitus without complications: Secondary | ICD-10-CM | POA: Diagnosis not present

## 2024-02-15 DIAGNOSIS — J441 Chronic obstructive pulmonary disease with (acute) exacerbation: Secondary | ICD-10-CM | POA: Diagnosis not present

## 2024-02-15 DIAGNOSIS — Z930 Tracheostomy status: Secondary | ICD-10-CM | POA: Diagnosis not present

## 2024-02-15 DIAGNOSIS — Z6841 Body Mass Index (BMI) 40.0 and over, adult: Secondary | ICD-10-CM | POA: Diagnosis not present

## 2024-02-15 DIAGNOSIS — Z86718 Personal history of other venous thrombosis and embolism: Secondary | ICD-10-CM

## 2024-02-15 DIAGNOSIS — Z8739 Personal history of other diseases of the musculoskeletal system and connective tissue: Secondary | ICD-10-CM | POA: Diagnosis not present

## 2024-02-15 DIAGNOSIS — I10 Essential (primary) hypertension: Secondary | ICD-10-CM | POA: Diagnosis not present

## 2024-02-15 DIAGNOSIS — I5032 Chronic diastolic (congestive) heart failure: Secondary | ICD-10-CM

## 2024-02-15 DIAGNOSIS — B974 Respiratory syncytial virus as the cause of diseases classified elsewhere: Secondary | ICD-10-CM | POA: Diagnosis not present

## 2024-02-15 DIAGNOSIS — F419 Anxiety disorder, unspecified: Secondary | ICD-10-CM | POA: Diagnosis not present

## 2024-02-15 DIAGNOSIS — E785 Hyperlipidemia, unspecified: Secondary | ICD-10-CM | POA: Diagnosis not present

## 2024-02-15 LAB — COMPREHENSIVE METABOLIC PANEL WITH GFR
ALT: 29 U/L (ref 0–44)
AST: 31 U/L (ref 15–41)
Albumin: 3.8 g/dL (ref 3.5–5.0)
Alkaline Phosphatase: 130 U/L — ABNORMAL HIGH (ref 38–126)
Anion gap: 11 (ref 5–15)
BUN: 17 mg/dL (ref 8–23)
CO2: 26 mmol/L (ref 22–32)
Calcium: 8.9 mg/dL (ref 8.9–10.3)
Chloride: 100 mmol/L (ref 98–111)
Creatinine, Ser: 0.98 mg/dL (ref 0.61–1.24)
GFR, Estimated: >60 mL/min
Glucose, Bld: 114 mg/dL — ABNORMAL HIGH (ref 70–99)
Potassium: 4 mmol/L (ref 3.5–5.1)
Sodium: 138 mmol/L (ref 135–145)
Total Bilirubin: 0.9 mg/dL (ref 0.0–1.2)
Total Protein: 7.4 g/dL (ref 6.5–8.1)

## 2024-02-15 LAB — CBC WITH DIFFERENTIAL/PLATELET
Abs Immature Granulocytes: 0.04 10*3/uL (ref 0.00–0.07)
Basophils Absolute: 0.1 10*3/uL (ref 0.0–0.1)
Basophils Relative: 1 %
Eosinophils Absolute: 0 10*3/uL (ref 0.0–0.5)
Eosinophils Relative: 0 %
HCT: 44.8 % (ref 39.0–52.0)
Hemoglobin: 14.3 g/dL (ref 13.0–17.0)
Immature Granulocytes: 1 %
Lymphocytes Relative: 33 %
Lymphs Abs: 1.9 10*3/uL (ref 0.7–4.0)
MCH: 28.5 pg (ref 26.0–34.0)
MCHC: 31.9 g/dL (ref 30.0–36.0)
MCV: 89.2 fL (ref 80.0–100.0)
Monocytes Absolute: 0.7 10*3/uL (ref 0.1–1.0)
Monocytes Relative: 13 %
Neutro Abs: 3 10*3/uL (ref 1.7–7.7)
Neutrophils Relative %: 52 %
Platelets: 165 10*3/uL (ref 150–400)
RBC: 5.02 MIL/uL (ref 4.22–5.81)
RDW: 15.4 % (ref 11.5–15.5)
WBC: 5.8 10*3/uL (ref 4.0–10.5)
nRBC: 0 % (ref 0.0–0.2)

## 2024-02-15 LAB — TROPONIN T, HIGH SENSITIVITY: Troponin T High Sensitivity: 19 ng/L (ref 0–19)

## 2024-02-15 LAB — GLUCOSE, CAPILLARY
Glucose-Capillary: 145 mg/dL — ABNORMAL HIGH (ref 70–99)
Glucose-Capillary: 161 mg/dL — ABNORMAL HIGH (ref 70–99)

## 2024-02-15 LAB — MAGNESIUM: Magnesium: 2.2 mg/dL (ref 1.7–2.4)

## 2024-02-15 LAB — PROCALCITONIN: Procalcitonin: 0.21 ng/mL

## 2024-02-15 LAB — CBG MONITORING, ED: Glucose-Capillary: 108 mg/dL — ABNORMAL HIGH (ref 70–99)

## 2024-02-15 MED ORDER — GABAPENTIN 100 MG PO CAPS
100.0000 mg | ORAL_CAPSULE | Freq: Every day | ORAL | Status: DC
  Administered 2024-02-15 – 2024-02-18 (×4): 100 mg via ORAL
  Filled 2024-02-15 (×4): qty 1

## 2024-02-15 MED ORDER — SODIUM CHLORIDE 0.9 % IV SOLN
1.0000 g | Freq: Once | INTRAVENOUS | Status: AC
  Administered 2024-02-15: 1 g via INTRAVENOUS
  Filled 2024-02-15: qty 10

## 2024-02-15 MED ORDER — SODIUM CHLORIDE 0.9 % IV SOLN: 1.0000 g | INTRAVENOUS | Status: DC

## 2024-02-15 MED ORDER — IPRATROPIUM-ALBUTEROL 0.5-2.5 (3) MG/3ML IN SOLN: 3.0000 mL | Freq: Three times a day (TID) | RESPIRATORY_TRACT | Status: DC

## 2024-02-15 MED ORDER — EMPAGLIFLOZIN 25 MG PO TABS
25.0000 mg | ORAL_TABLET | Freq: Every day | ORAL | Status: DC
  Administered 2024-02-16 – 2024-02-18 (×3): 25 mg via ORAL
  Filled 2024-02-15 (×3): qty 1

## 2024-02-15 MED ORDER — BUDESONIDE 0.5 MG/2ML IN SUSP
0.5000 mg | Freq: Two times a day (BID) | RESPIRATORY_TRACT | Status: DC
  Administered 2024-02-15 – 2024-02-18 (×7): 0.5 mg via RESPIRATORY_TRACT
  Filled 2024-02-15 (×7): qty 2

## 2024-02-15 MED ORDER — INSULIN ASPART 100 UNIT/ML IJ SOLN: 0.0000 [IU] | Freq: Every day | INTRAMUSCULAR | Status: DC

## 2024-02-15 MED ORDER — ALBUTEROL SULFATE (2.5 MG/3ML) 0.083% IN NEBU: 2.5000 mg | INHALATION_SOLUTION | RESPIRATORY_TRACT | Status: DC | PRN

## 2024-02-15 MED ORDER — ATORVASTATIN CALCIUM 80 MG PO TABS
80.0000 mg | ORAL_TABLET | Freq: Every day | ORAL | Status: DC
  Administered 2024-02-15 – 2024-02-18 (×4): 80 mg via ORAL
  Filled 2024-02-15: qty 2
  Filled 2024-02-15 (×3): qty 1

## 2024-02-15 MED ORDER — ALLOPURINOL 100 MG PO TABS
100.0000 mg | ORAL_TABLET | Freq: Every day | ORAL | Status: DC
  Administered 2024-02-15 – 2024-02-18 (×4): 100 mg via ORAL
  Filled 2024-02-15 (×4): qty 1

## 2024-02-15 MED ORDER — ACETAMINOPHEN 325 MG PO TABS
650.0000 mg | ORAL_TABLET | Freq: Once | ORAL | Status: AC
  Administered 2024-02-15: 650 mg via ORAL
  Filled 2024-02-15: qty 2

## 2024-02-15 MED ORDER — GUAIFENESIN ER 600 MG PO TB12
600.0000 mg | ORAL_TABLET | Freq: Two times a day (BID) | ORAL | Status: DC
  Administered 2024-02-15 – 2024-02-18 (×7): 600 mg via ORAL
  Filled 2024-02-15 (×7): qty 1

## 2024-02-15 MED ORDER — IOHEXOL 350 MG/ML SOLN
75.0000 mL | Freq: Once | INTRAVENOUS | Status: AC | PRN
  Administered 2024-02-15: 75 mL via INTRAVENOUS

## 2024-02-15 MED ORDER — INSULIN GLARGINE-YFGN 100 UNIT/ML ~~LOC~~ SOLN
26.0000 [IU] | Freq: Every day | SUBCUTANEOUS | Status: DC
  Filled 2024-02-15: qty 0.26

## 2024-02-15 MED ORDER — INSULIN ASPART 100 UNIT/ML IJ SOLN
0.0000 [IU] | Freq: Three times a day (TID) | INTRAMUSCULAR | Status: DC
  Administered 2024-02-15 – 2024-02-16 (×3): 1 [IU] via SUBCUTANEOUS
  Administered 2024-02-17: 2 [IU] via SUBCUTANEOUS
  Administered 2024-02-17: 1 [IU] via SUBCUTANEOUS
  Administered 2024-02-18: 2 [IU] via SUBCUTANEOUS
  Filled 2024-02-15 (×2): qty 1
  Filled 2024-02-15: qty 9
  Filled 2024-02-15: qty 1
  Filled 2024-02-15 (×2): qty 2

## 2024-02-15 MED ORDER — ARFORMOTEROL TARTRATE 15 MCG/2ML IN NEBU
15.0000 ug | INHALATION_SOLUTION | Freq: Two times a day (BID) | RESPIRATORY_TRACT | Status: DC
  Administered 2024-02-15 – 2024-02-18 (×7): 15 ug via RESPIRATORY_TRACT
  Filled 2024-02-15 (×7): qty 2

## 2024-02-15 MED ORDER — REVEFENACIN 175 MCG/3ML IN SOLN
175.0000 ug | Freq: Every day | RESPIRATORY_TRACT | Status: DC
  Administered 2024-02-16 – 2024-02-18 (×3): 175 ug via RESPIRATORY_TRACT
  Filled 2024-02-15 (×3): qty 3

## 2024-02-15 MED ORDER — ACETAMINOPHEN 650 MG RE SUPP: 650.0000 mg | Freq: Four times a day (QID) | RECTAL | Status: DC | PRN

## 2024-02-15 MED ORDER — ONDANSETRON HCL 4 MG/2ML IJ SOLN: 4.0000 mg | Freq: Four times a day (QID) | INTRAMUSCULAR | Status: DC | PRN

## 2024-02-15 MED ORDER — PREDNISONE 20 MG PO TABS
40.0000 mg | ORAL_TABLET | Freq: Every day | ORAL | Status: DC
  Administered 2024-02-15 – 2024-02-17 (×3): 40 mg via ORAL
  Filled 2024-02-15 (×3): qty 2

## 2024-02-15 MED ORDER — APIXABAN 5 MG PO TABS
5.0000 mg | ORAL_TABLET | Freq: Two times a day (BID) | ORAL | Status: DC
  Administered 2024-02-15 – 2024-02-18 (×7): 5 mg via ORAL
  Filled 2024-02-15 (×7): qty 1

## 2024-02-15 MED ORDER — BENZONATATE 100 MG PO CAPS: 200.0000 mg | ORAL_CAPSULE | Freq: Three times a day (TID) | ORAL | Status: DC | PRN

## 2024-02-15 MED ORDER — IPRATROPIUM-ALBUTEROL 0.5-2.5 (3) MG/3ML IN SOLN: 3.0000 mL | Freq: Four times a day (QID) | RESPIRATORY_TRACT | Status: DC

## 2024-02-15 MED ORDER — TORSEMIDE 20 MG PO TABS
10.0000 mg | ORAL_TABLET | Freq: Every day | ORAL | Status: DC
  Administered 2024-02-15: 10 mg via ORAL
  Filled 2024-02-15: qty 1

## 2024-02-15 MED ORDER — SODIUM CHLORIDE 0.9 % IV SOLN
2.0000 g | INTRAVENOUS | Status: DC
  Administered 2024-02-15 – 2024-02-16 (×2): 2 g via INTRAVENOUS
  Filled 2024-02-15 (×2): qty 20

## 2024-02-15 MED ORDER — ACETAMINOPHEN 325 MG PO TABS: 650.0000 mg | ORAL_TABLET | Freq: Four times a day (QID) | ORAL | Status: DC | PRN

## 2024-02-15 MED ORDER — METOPROLOL TARTRATE 12.5 MG HALF TABLET
12.5000 mg | ORAL_TABLET | Freq: Two times a day (BID) | ORAL | Status: DC
  Administered 2024-02-15 (×2): 12.5 mg via ORAL
  Filled 2024-02-15 (×2): qty 1

## 2024-02-15 MED ORDER — SERTRALINE HCL 100 MG PO TABS
100.0000 mg | ORAL_TABLET | Freq: Every day | ORAL | Status: DC
  Administered 2024-02-15 – 2024-02-18 (×4): 100 mg via ORAL
  Filled 2024-02-15 (×4): qty 1

## 2024-02-15 MED ORDER — MELATONIN 3 MG PO TABS
3.0000 mg | ORAL_TABLET | Freq: Every evening | ORAL | Status: DC | PRN
  Administered 2024-02-16 – 2024-02-17 (×2): 3 mg via ORAL
  Filled 2024-02-15 (×2): qty 1

## 2024-02-15 MED ORDER — AZITHROMYCIN 250 MG PO TABS
500.0000 mg | ORAL_TABLET | Freq: Every day | ORAL | Status: DC
  Administered 2024-02-15 – 2024-02-18 (×4): 500 mg via ORAL
  Filled 2024-02-15 (×4): qty 2

## 2024-02-15 NOTE — H&P (Signed)
 " History and Physical    Patient: Timothy Le FMW:[id number] DOB: [birth year] DOA: 02/14/2024 DOS: the patient was seen and examined on 02/15/2024 PCP: [name], [name], FNP  Patient coming from: Home  Chief Complaint:  Chief Complaint  Patient presents with   Respiratory Distress   HPI: Timothy Le is a 64 y.o. male with medical history significant of hypertension, diastolic congestive heart failure, COPD, chronic hypoxic respiratory failure s/p tracheostomy , diabetes mellitus type 2, DVT on chronic anticoagulation, gout, OSA, OHS, and morbid obesity presents with shortness of breath and productive cough.  He has been experiencing shortness of breath and a productive cough for the past three days. His roommate was recently sick. He has no fever or chills but reports sweats, headaches particularly around the eyes, and sinus congestion and pain. He has a tracheostomy and is not normally on oxygen. He reports wheezing at home and is bringing up sputum when coughing.  He has not experienced any stomach pain but did not eat for two days prior to the visit. He was able to tolerate breakfast on the day of the visit.  In terms of social history, he quit smoking in 2015 and does not consume alcohol or use drugs.   In the emergency department patient was noted to be febrile up to 100.7 F with tachypnea, and O2 saturations currently maintained on 7 L by trach collar.  Labs noted high-sensitivity troponin 20-> 19, procalcitonin 0.21, glucose 136, and alkaline phosphatase 136->130.  Venous blood gas did not show any signs of hypercapnia.  Chest x-ray noted question of the left basilar airspace opacity.  RSV screening was noted to be positive.  CT angiogram of the chest noted no pulmonary embolism and a patchy infiltrate in the right lower lung without associated effusion.  Patient had been given acetaminophen  650 mg po, Rocephin , and azithromycin .  Review of Systems: As mentioned in the  history of present illness. All other systems reviewed and are negative.  Past Medical History:  Diagnosis Date   Asthma    Chronic respiratory failure with hypoxia (HCC) 07/09/2017   COPD (chronic obstructive pulmonary disease) (HCC)    Diabetes mellitus without complication (HCC)    Difficult intubation    Hypertension    Severe obesity (BMI >= 40) (HCC)    Shortness of breath dyspnea    Sleep apnea    Tracheobronchitis    Past Surgical History:  Procedure Laterality Date   CORONARY ULTRASOUND/IVUS Left 12/11/2019   Procedure: Intravascular Ultrasound/IVUS;  Surgeon: Sheree Penne Bruckner, MD;  Location: [facility] INVASIVE CV LAB;  Service: Cardiovascular;  Laterality: Left;  Left lower extremity venous   IR GASTROSTOMY TUBE MOD SED  07/05/2017   IVC VENOGRAPHY N/A 01/10/2021   Procedure: IVC VENOGRAPHY - VENA CAVA;  Surgeon: Eliza Bruckner GORMAN, MD;  Location: [facility] INVASIVE CV LAB;  Service: Cardiovascular;  Laterality: N/A;   LOWER EXTREMITY VENOGRAPHY N/A 01/10/2021   Procedure: LOWER EXTREMITY VENOGRAPHY;  Surgeon: Eliza Bruckner GORMAN, MD;  Location: [facility] INVASIVE CV LAB;  Service: Cardiovascular;  Laterality: N/A;   PERIPHERAL VASCULAR THROMBECTOMY Left 12/11/2019   Procedure: PERIPHERAL VASCULAR THROMBECTOMY;  Surgeon: Sheree Penne Bruckner, MD;  Location: [facility] INVASIVE CV LAB;  Service: Cardiovascular;  Laterality: Left;   THYROIDECTOMY Left 08/30/2019   Procedure: THYROIDECTOMY CERVICAL LIPECTOMY;  Surgeon: Jesus Oliphant, MD;  Location: [facility] OR;  Service: ENT;  Laterality: Left;  NEEDS RNFA PLEASE   TRACHEOSTOMY TUBE PLACEMENT N/A 06/17/2017   Procedure: TRACHEOSTOMY;  Surgeon: Karis Clunes, MD;  Location: [facility] OR;  Service: ENT;  Laterality: N/A;   TRACHEOSTOMY TUBE PLACEMENT N/A 08/14/2019   Procedure: TRACHEOSTOMY;  Surgeon: Jesus Oliphant, MD;  Location: [facility] OR;  Service: ENT;  Laterality: N/A;   TRACHEOSTOMY TUBE PLACEMENT N/A 08/30/2019   Procedure: TRACHEOSTOMY  REVISION;  Surgeon:  Jesus Oliphant, MD;  Location: [facility] OR;  Service: ENT;  Laterality: N/A;   Social History:  reports that he quit smoking about 10 years ago. His smoking use included cigarettes. He started smoking about 40 years ago. He has a 14.8 pack-year smoking history. He has never used smokeless tobacco. He reports that he does not drink alcohol and does not use drugs.  Allergies[1]  Family History  Problem Relation Age of Onset   Asthma Sister    Asthma Other        nephew   Hypertension Sister    Diabetes type II Sister     Prior to Admission medications  Medication Sig Start Date End Date Taking? Authorizing Provider  albuterol  (VENTOLIN  HFA) 108 (90 Base) MCG/ACT inhaler Inhale 2 puffs into the lungs every 6 (six) hours as needed for wheezing or shortness of breath. 03/09/23   Neysa Reggy BIRCH, MD  allopurinol  (ZYLOPRIM ) 100 MG tablet Take 1 tablet (100 mg total) by mouth daily. 09/24/23   [name], [name], FNP  apixaban  (ELIQUIS ) 5 MG TABS tablet Take 1 tablet (5 mg total) by mouth 2 (two) times daily. 09/13/23   [name], [name], FNP  atorvastatin  (LIPITOR) 80 MG tablet Take 1 tablet (80 mg total) by mouth daily. 01/25/24   [name], [name], FNP  desonide  (DESOWEN ) 0.05 % cream Apply topically 2 (two) times daily. For dry and peeling skin. Use for 2 weeks. 01/07/24   [name], [name], FNP  diclofenac  Sodium (VOLTAREN ) 1 % GEL Apply 2 g topically 4 (four) times daily. 09/24/23   [name], [name], FNP  empagliflozin  (JARDIANCE ) 25 MG TABS tablet Take 1 tablet (25 mg total) by mouth daily before breakfast. 06/23/23   [name], [name], FNP  gabapentin  (NEURONTIN ) 100 MG capsule Take 1 capsule (100 mg total) by mouth daily. 02/15/23   [name], [name], FNP  glucose blood (ACCU-CHEK GUIDE TEST) test strip Use as instructed 10/08/23   [name], [name], FNP  insulin  glargine (LANTUS  SOLOSTAR) 100 UNIT/ML Solostar Pen Inject 36 units into the skin once daily. May titrate up to 44 units daily if  instructed by your provider. 08/23/23   [name], [name], FNP  Insulin  Pen Needle (PEN NEEDLES) 32G X 4 MM MISC Use to inject Lantus  daily 09/24/23   [name], [name], FNP  metFORMIN  (GLUCOPHAGE -XR) 500 MG 24 hr tablet Take 1 tablet (500 mg total) by mouth in the morning and at bedtime. 09/24/23   [name], [name], FNP  metoprolol  tartrate (LOPRESSOR ) 25 MG tablet Take 0.5 tablets (12.5 mg total) by mouth 2 (two) times daily. 09/13/23   [name], [name], FNP  mometasone -formoterol  (DULERA ) 100-5 MCG/ACT AERO Inhale 2 puffs into the lungs 2 (two) times daily. 09/24/23   [name], [name], FNP  QUEtiapine  (SEROQUEL ) 100 MG tablet Take 1 tablet (100 mg total) by mouth 2 (two) times daily. 09/13/23   [name], [name], FNP  Semaglutide , 2 MG/DOSE, 8 MG/3ML SOPN Inject 2 mg into the skin once a week. 01/25/24   [name], [name], FNP  sertraline  (ZOLOFT ) 100 MG tablet Take 1 tablet (100 mg total) by mouth daily. 01/07/24  [name], [name], FNP  torsemide  (DEMADEX ) 10 MG tablet Take 1 tablet (10 mg total) by mouth daily. 09/13/23   [name], [name], FNP    Physical Exam: Vitals:   02/15/24 0145 02/15/24 0218 02/15/24 0606 02/15/24 0720  BP:    (!) 147/94  Pulse:    (!) 52  Resp:    20  Temp:  (!) 100.7 F (38.2 C) 98.7 F (37.1 C)   TempSrc:  Oral Oral   SpO2: 100%   95%  Weight:      Height:          Constitutional: Obese male NAD, calm, comfortable Eyes: PERRL, lids and conjunctivae normal ENMT: Mucous membranes are moist. Normal dentition.  Neck: Tracheostomy in place receiving 7 L of oxygen via trach mask Respiratory: Decreased aeration with mild expiratory wheezes appreciated.  No accessory muscle use.  Cardiovascular: Regular rate and rhythm, no murmurs / rubs / gallops. No extremity edema.  Abdomen: no tenderness, no masses palpated.  Musculoskeletal: no clubbing / cyanosis. No joint deformity upper and lower extremities.  Normal muscle tone.  Skin: no rashes, lesions,  ulcers. Neurologic: CN 2-12 grossly intact. Strength 5/5 in all 4.  Psychiatric: Normal judgment and insight. Alert and oriented x 3. Normal mood.   Data Reviewed:  EKG reveals sinus rhythm at 71 bpm with premature atrial complexes.  Reviewed labs, imaging, and pertinent records as documented.  Assessment and Plan:  Acute on chronic respiratory failure with hypoxia secondary to RSV pneumonia Tracheostomy dependent Acute.  Patient presents with progressive worsening shortness of breath.  O2 saturations noted to be 90% on room air.  Currently requiring 7 L by trach collar to maintain O2 saturations.  CT angiogram of the chest noted patchy infiltrate in the right lower lobe without associated effusion and no signs of pulmonary embolism.  Procalcitonin noted to be 0.21.  Patient had been started on empiric antibiotics of Rocephin  and azithromycin . - Admit to telemetry bed - Continuous pulse oximetry with nasal cannula oxygen to maintain O2 saturation greater than 90 - Incentive spirometry and flutter valve - Continue Rocephin  and azithromycin  - Mucinex   COPD exacerbation Patient reported to have wheezing with shortness of breath. Though secondary to RSV. - DuoNeb QID and albuterol  as needed - Brovana  and budesonide  nebs twice daily - Prednisone  40 mg daily  Heart failure with preserved ejection fraction Chronic.  Patient appears to be euvolemic on physical exam last echocardiogram noted EF to be 50 to 55% with indeterminate diastolic parameters when last checked back in 2021.   - Strict I&Os Daily weights - Continue torsemide   Essential hypertension Blood pressures currently maintained. - Continue metoprolol  and torsemide   Controlled diabetes mellitus type 2 with long-term use of insulin  On admission glucose noted to be 136.  Last hemoglobin A1c noted to be 6.8 when checked on 01/24/2024. - Hypoglycemic protocols - Hold metformin  - Continue Jardiance  - Continue pharmacy substitution  of Semglee  - CBGs before every meal with sensitive SSI  History of DVT on chronic anticoagulation Has a history of left lower extremity DVT on chronic anticoagulation. - Continue Eliquis   Dyslipidemia - Continue atorvastatin   Anxiety   - Continue Zoloft   Gout - Continue allopurinol   Morbid obesity BMI 50.94 kg/m DVT prophylaxis: Eliquis  Advance Care Planning:   Code Status: Full Code    Consults: None  Family Communication: None  Severity of Illness: The appropriate patient status for this patient is INPATIENT. Inpatient status is judged to be reasonable and  necessary in order to provide the required intensity of service to ensure the patient's safety. The patient's presenting symptoms, physical exam findings, and initial radiographic and laboratory data in the context of their chronic comorbidities is felt to place them at high risk for further clinical deterioration. Furthermore, it is not anticipated that the patient will be medically stable for discharge from the hospital within 2 midnights of admission.   * I certify that at the point of admission it is my clinical judgment that the patient will require inpatient hospital care spanning beyond 2 midnights from the point of admission due to high intensity of service, high risk for further deterioration and high frequency of surveillance required.*  Author: Maximino DELENA Sharps, MD 02/15/2024 7:50 AM  For on call review [email or web address].      [1] No Known Allergies  "

## 2024-02-15 NOTE — Progress Notes (Signed)
 MD notified that pt is requiring 100% FIO2, an increase from the previous 50% he has worn since last night. Consult for PCCM requested to hospitalist due to pt having a cuffless trach. SATs maintaining 90% on 100%, pt not currently having an increase in SOB.    02/15/24 1300  Therapy Vitals  Pulse Rate 68  Resp (!) 22  BP (!) 158/134  Patient Position (if appropriate) Sitting  MEWS Score/Color  MEWS Score 1  MEWS Score Color Green  Respiratory Assessment  Assessment Type Assess only  Respiratory Pattern Regular;Labored  Chest Assessment Chest expansion symmetrical  Cough Strong;Productive  Sputum Amount Copious  Sputum Color White;Tan  Sputum Consistency Thick;Tenacious  Sputum Specimen Source Tracheostomy tube  Bilateral Breath Sounds Rhonchi  Oxygen Therapy/Pulse Ox  O2 Device Tracheostomy Collar  SpO2 (!) 81 %  O2 Therapy Oxygen humidified  O2 Flow Rate (L/min) 11 L/min  FiO2 (%) 100 %  Tracheostomy Shiley Flexible 4 mm Uncuffed  Placement Date/Time: 02/15/24 0800   Inserted prior to hospital arrival?: Placed at another facility  Brand: Shiley Flexible  Size (mm): 4 mm  Style: Uncuffed  Status Secured with trach ties (PMV removed.)

## 2024-02-15 NOTE — Consult Note (Signed)
 "  NAME:  Timothy Le, MRN:  [id number], DOB:  Jul 28, 1960, LOS: 0 ADMISSION DATE:  02/14/2024, CONSULTATION DATE:  02/15/24 REFERRING MD:  Claudene SAUNDERS, CHIEF COMPLAINT:  AonCRF s/p trach  History of Present Illness:  Timothy Le is a 64 yo male known to PCCM with past medical history significant for morbid obesity, HTN, CHF, COPD, OHS, chronic hypoxic respiratory failure s/p tracheostomy not on baseline oxygen, DM2, DVT on Eliquis , OSA, who presented to ED with progressive dyspnea over the last several days. In the ED his SpO2 was 90% on RA and he was initially placed on 7L via TC without improvement and then increased to 11L. Labs notable for RSV+, troponin 20, CO2 36 on vbg. CTA was negative for PE, but noted patchy infiltrate to RLL without associated effusion. He was started on triple therapy nebs, prednisone , and Azithromycin /Ceftriaxone  for CAP coverage. Patient is admitted to [facility]. PCCM consulted for pulmonary evaluation in setting of tracheostomy with increasing oxygen requirements.  Will follow-along.  Pertinent Medical History:   Past Medical History:  Diagnosis Date   Asthma    Chronic respiratory failure with hypoxia (HCC) 07/09/2017   COPD (chronic obstructive pulmonary disease) (HCC)    Diabetes mellitus without complication (HCC)    Difficult intubation    Hypertension    Severe obesity (BMI >= 40) (HCC)    Shortness of breath dyspnea    Sleep apnea    Tracheobronchitis    Significant Hospital Events: Including procedures, antibiotic start and stop dates in addition to other pertinent events   1/26: ED TRH dyspnea, RSV+, inc FiO2 via TC  Interim History / Subjective:  PCCM consulted for pulmonary evaluation  Objective    Blood pressure 133/76, pulse (!) 141, temperature 98.4 F (36.9 C), temperature source Axillary, resp. rate (!) 22, height 5' 8 (1.727 m), weight (!) 152 kg, SpO2 98%.    FiO2 (%):  [48 %-100 %] 100 %   Intake/Output Summary (Last 24 hours) at  02/15/2024 1338 Last data filed at 02/15/2024 0218 Gross per 24 hour  Intake 100 ml  Output --  Net 100 ml   Filed Weights   02/14/24 2227  Weight: (!) 152 kg   Examination: General: acutely-ill obese male, in NAD HEENT: AT/[place], PERRL, 3mm bilaterally Pulm: expiratory wheezes, normal inspiratory/expiratory effort on TC;  CV: RRR, no m/g/r GI: soft, obese, nontender to palpation Neuro: A&O x3, no focal deficits, able to phonate with manual occlusion of trach  Resolved Problem List:   Assessment and Plan:   Acute on chronic respiratory failure s/p tracheostomy in setting of RSV pneumonia AECOPD OHS Trach dependent -s/p tracheostomy 07/2019 w/ revision (08/2019); not on baseline O2 -Follows w/ trach clinic; last visit 09/08/23; Trach 4.0 [id number] Shiley -Patient has lost considerable weight over the years. Encouraged continued weight loss and regular follow-up with clinic. Reports continued dyspnea with ambulation requiring use of rescue inhalers qod; however, symptoms are improving. Daily regimen requires adjustment. -CTA negative for PE, RLL infiltrate without associated effusion -Empirically started on Azithromycin /Ceftriaxone  -Cont ICS/LAMA/LABA; Albuterol  PRN, guaifenesin  -Prednisone  40mg  daily -Continue TC, wean to maintain SpO2 >90% -VBG, CXR PRN -Encourage mobilization -Bronchopulmonary hygeine  Labs:  CBC: Recent Labs  Lab 02/14/24 2226 02/14/24 2229 02/15/24 0349  WBC 6.5  --  5.8  NEUTROABS  --   --  3.0  HGB 14.9 15.6 14.3  HCT 47.3 46.0 44.8  MCV 88.7  --  89.2  PLT 184  --  165  Basic Metabolic Panel: Recent Labs  Lab 02/14/24 2226 02/14/24 2229 02/15/24 0349  NA 137 139 138  K 3.9 3.9 4.0  CL 99  --  100  CO2 24  --  26  GLUCOSE 136*  --  114*  BUN 17  --  17  CREATININE 1.01  --  0.98  CALCIUM  8.9  --  8.9  MG  --   --  2.2   GFR: Estimated Creatinine Clearance: 112.5 mL/min (by C-G formula based on SCr of 0.98 mg/dL). Recent Labs  Lab  02/14/24 2226 02/15/24 0349  PROCALCITON  --  0.21  WBC 6.5 5.8    Liver Function Tests: Recent Labs  Lab 02/14/24 2226 02/15/24 0349  AST 36 31  ALT 29 29  ALKPHOS 136* 130*  BILITOT 1.2 0.9  PROT 7.8 7.4  ALBUMIN  3.8 3.8   No results for input(s): LIPASE, AMYLASE in the last 168 hours. No results for input(s): AMMONIA in the last 168 hours.  ABG    Component Value Date/Time   PHART 7.444 08/24/2019 0514   PCO2ART 50.3 (H) 08/24/2019 0514   PO2ART 123 (H) 08/24/2019 0514   HCO3 27.7 02/14/2024 2229   TCO2 29 02/14/2024 2229   ACIDBASEDEF 1.0 07/30/2019 1724   O2SAT 97 02/14/2024 2229     Coagulation Profile: No results for input(s): INR, PROTIME in the last 168 hours.  Cardiac Enzymes: No results for input(s): CKTOTAL, CKMB, CKMBINDEX, TROPONINI in the last 168 hours.  HbA1C: Hemoglobin A1C  Date/Time Value Ref Range Status  09/24/2023 11:20 AM 6.1 (A) 4.0 - 5.6 % Final  06/23/2023 02:09 PM 6.1 (A) 4.0 - 5.6 % Final   HbA1c, POC (prediabetic range)  Date/Time Value Ref Range Status  05/02/2021 08:34 AM 12.1 (A) 5.7 - 6.4 % Final  01/23/2021 10:31 AM 11.5 (A) 5.7 - 6.4 % Final   HbA1c, POC (controlled diabetic range)  Date/Time Value Ref Range Status  05/02/2021 08:34 AM 12.1 (A) 0.0 - 7.0 % Final  01/23/2021 10:31 AM 11.5 (A) 0.0 - 7.0 % Final   HbA1c POC (<> result, manual entry)  Date/Time Value Ref Range Status  05/02/2021 08:34 AM 12.1 4.0 - 5.6 % Final  01/23/2021 10:31 AM 11.5 4.0 - 5.6 % Final   Hgb A1c MFr Bld  Date/Time Value Ref Range Status  01/24/2024 11:52 AM 6.8 (H) 4.8 - 5.6 % Final    Comment:             Prediabetes: 5.7 - 6.4          Diabetes: >6.4          Glycemic control for adults with diabetes: <7.0    CBG: Recent Labs  Lab 02/15/24 1144  GLUCAP 108*    Review of Systems:   Review of systems completed with pertinent positives/negatives outlined in above HPI.  Past Medical History:  He,  has a  past medical history of Asthma, Chronic respiratory failure with hypoxia (HCC) (07/09/2017), COPD (chronic obstructive pulmonary disease) (HCC), Diabetes mellitus without complication (HCC), Difficult intubation, Hypertension, Severe obesity (BMI >= 40) (HCC), Shortness of breath dyspnea, Sleep apnea, and Tracheobronchitis.   Surgical History:   Past Surgical History:  Procedure Laterality Date   CORONARY ULTRASOUND/IVUS Left 12/11/2019   Procedure: Intravascular Ultrasound/IVUS;  Surgeon: Sheree Penne Bruckner, MD;  Location: [facility] INVASIVE CV LAB;  Service: Cardiovascular;  Laterality: Left;  Left lower extremity venous   IR GASTROSTOMY TUBE MOD SED  07/05/2017  IVC VENOGRAPHY N/A 01/10/2021   Procedure: IVC VENOGRAPHY - VENA CAVA;  Surgeon: Eliza Lonni RAMAN, MD;  Location: [facility] INVASIVE CV LAB;  Service: Cardiovascular;  Laterality: N/A;   LOWER EXTREMITY VENOGRAPHY N/A 01/10/2021   Procedure: LOWER EXTREMITY VENOGRAPHY;  Surgeon: Eliza Lonni RAMAN, MD;  Location: [facility] INVASIVE CV LAB;  Service: Cardiovascular;  Laterality: N/A;   PERIPHERAL VASCULAR THROMBECTOMY Left 12/11/2019   Procedure: PERIPHERAL VASCULAR THROMBECTOMY;  Surgeon: Sheree Penne Lonni, MD;  Location: [facility] INVASIVE CV LAB;  Service: Cardiovascular;  Laterality: Left;   THYROIDECTOMY Left 08/30/2019   Procedure: THYROIDECTOMY CERVICAL LIPECTOMY;  Surgeon: Jesus Oliphant, MD;  Location: [facility] OR;  Service: ENT;  Laterality: Left;  NEEDS RNFA PLEASE   TRACHEOSTOMY TUBE PLACEMENT N/A 06/17/2017   Procedure: TRACHEOSTOMY;  Surgeon: Karis Clunes, MD;  Location: [facility] OR;  Service: ENT;  Laterality: N/A;   TRACHEOSTOMY TUBE PLACEMENT N/A 08/14/2019   Procedure: TRACHEOSTOMY;  Surgeon: Jesus Oliphant, MD;  Location: [facility] OR;  Service: ENT;  Laterality: N/A;   TRACHEOSTOMY TUBE PLACEMENT N/A 08/30/2019   Procedure: TRACHEOSTOMY  REVISION;  Surgeon: Jesus Oliphant, MD;  Location: [facility] OR;  Service: ENT;  Laterality: N/A;     Social History:    reports that he quit smoking about 10 years ago. His smoking use included cigarettes. He started smoking about 40 years ago. He has a 14.8 pack-year smoking history. He has never used smokeless tobacco. He reports that he does not drink alcohol and does not use drugs.   Family History:  His family history includes Asthma in his sister and another family member; Diabetes type II in his sister; Hypertension in his sister.   Allergies Allergies[1]   Home Medications  Prior to Admission medications  Medication Sig Start Date End Date Taking? Authorizing Provider  albuterol  (VENTOLIN  HFA) 108 (90 Base) MCG/ACT inhaler Inhale 2 puffs into the lungs every 6 (six) hours as needed for wheezing or shortness of breath. 03/09/23  Yes Young, Reggy D, MD  allopurinol  (ZYLOPRIM ) 100 MG tablet Take 1 tablet (100 mg total) by mouth daily. 09/24/23  Yes [name], [name], FNP  apixaban  (ELIQUIS ) 5 MG TABS tablet Take 1 tablet (5 mg total) by mouth 2 (two) times daily. 09/13/23  Yes [name], [name], FNP  atorvastatin  (LIPITOR) 80 MG tablet Take 1 tablet (80 mg total) by mouth daily. 01/25/24  Yes [name], [name], FNP  desonide  (DESOWEN ) 0.05 % cream Apply topically 2 (two) times daily. For dry and peeling skin. Use for 2 weeks. 01/07/24  Yes [name], [name], FNP  diclofenac  Sodium (VOLTAREN ) 1 % GEL Apply 2 g topically 4 (four) times daily. Patient taking differently: Apply 2 g topically 4 (four) times daily as needed (pain). 09/24/23  Yes [name], [name], FNP  empagliflozin  (JARDIANCE ) 25 MG TABS tablet Take 1 tablet (25 mg total) by mouth daily before breakfast. 06/23/23  Yes [name], [name], FNP  gabapentin  (NEURONTIN ) 100 MG capsule Take 1 capsule (100 mg total) by mouth daily. 02/15/23  Yes [name], [name], FNP  insulin  glargine (LANTUS  SOLOSTAR) 100 UNIT/ML Solostar Pen Inject 36 units into the skin once daily. May titrate up to 44 units daily if instructed by your provider. Patient  taking differently: Inject 26 Units into the skin daily. May titrate up to 44 units daily if instructed by your provider. 08/23/23  Yes [name], [name], FNP  metFORMIN  (GLUCOPHAGE -XR) 500 MG 24 hr tablet Take 1 tablet (500 mg total) by mouth in the  morning and at bedtime. 09/24/23  Yes [name], [name], FNP  metoprolol  tartrate (LOPRESSOR ) 25 MG tablet Take 0.5 tablets (12.5 mg total) by mouth 2 (two) times daily. 09/13/23  Yes [name], [name], FNP  mometasone -formoterol  (DULERA ) 100-5 MCG/ACT AERO Inhale 2 puffs into the lungs 2 (two) times daily. 09/24/23  Yes [name], [name], FNP  QUEtiapine  (SEROQUEL ) 100 MG tablet Take 1 tablet (100 mg total) by mouth 2 (two) times daily. 09/13/23  Yes [name], [name], FNP  Semaglutide , 2 MG/DOSE, 8 MG/3ML SOPN Inject 2 mg into the skin once a week. 01/25/24  Yes [name], [name], FNP  sertraline  (ZOLOFT ) 100 MG tablet Take 1 tablet (100 mg total) by mouth daily. 01/07/24  Yes [name], [name], FNP  torsemide  (DEMADEX ) 10 MG tablet Take 1 tablet (10 mg total) by mouth daily. 09/13/23  Yes [name], [name], FNP  glucose blood (ACCU-CHEK GUIDE TEST) test strip Use as instructed 10/08/23   [name], [name], FNP  Insulin  Pen Needle (PEN NEEDLES) 32G X 4 MM MISC Use to inject Lantus  daily 09/24/23   [name], [name], FNP    Murat Rideout, DNP, AGACNP-BC [facility]  Please see [email or web address] for pager details.  From 7A-7P if no response, please call [phone]. After hours, please call ELink [phone].     [1] No Known Allergies  "

## 2024-02-15 NOTE — Discharge Instructions (Signed)
 [place] [address] [place] [address] [phone] PANTRY 3rd Th: 9a - 12p; 3rd Th: 4p - 7p  [employer] [facility] [address] [place] [address] [phone] PANTRY varies - call agency  [address] [place] [facility] [address] [place] [address] [phone] PANTRY 2nd & 4th Saturday 11am-1pm  [address] [place] [address] [phone] PANTRY by appointment Sa: 10:30a - 11:30a  [facility] [address] [place] [address] [phone] PANTRY Sa: 10a - 12p  on varying weeks; call agency for schedule  [facility] [address] [place] [address] [phone] PANTRY Tu : 10a - 12p; W: 10a - 12p; Th: 10a - 12p  Caprice Anitra Donnamae AVIVA [address] [place] [address] [phone] PANTRY 1st & 3rd Tu: 10a - 1p; 3rd Sa: 10a - 12p  [facility] [address] [place] [address] [phone] PANTRY W (By Appointment only - call prior to W to make an appointment)  [facility] [address] [place] [address] [phone] MARINE MASSING M - F: times and dates vary - call agency  [facility] [address] [facility] [address] [phone] PANTRY 1st & 3rd Th: 10a-2p  [facility] [address] [place] [address] [phone] PANTRY 3rd Sat of month: 11a-1p  [facility] [employer] [address] [place] [address] [phone] MARINE MASSING 4th [place]: 11a - 2p  [facility] / [facility] [address] [place] [address] [phone] PANTRY weekly on W 9a-10:30a  [employer] [facility] [address] [place] [address] [phone] PANTRY 2nd & 4th Th: 1p - 2:30p  [facility] [address] [place] [address] [phone] PANTRY 1st & 3rd F: 9a-12p; 2nd&4th Sa: 9a-12p  [facility] [address] [place] [address] [phone]  PANTRY T-TH 9am-2pm   [facility] [phone] LELON [place] [address] [phone] PANTRY Thursdays 10am-12:30pm  [employer] [facility] [address] [place] [address] [phone] PANTRY 4th Sa: 9a - [address] of [facility] VEATRICE [place] [address] [phone] PANTRY 2nd M 5:30p - 7:30p; each W 9:30a - 11:30a  [facility] [address] [place] [address] [phone] PANTRY Tues 11a-1p call for appointment preferred  [facility] [address] [place] [address] [phone] PANTRY mobile distribution sites; call agency for listing  [facility] [address] [place] [address] [phone] PANTRY W: 9a-11a  [facility] [address] [place] [place] [address] [phone] ` T: 12:p -2p  [facility] [address] [place] [address] [phone] PANTRY M: 9:30a - 2:30p; Tu: 9:30a - 2:30p; W: 9:30a - 2:30p; Th: 9:30a - 2:30p  [facility] [address] [place] [address] [phone] PANTRY F: 11a - 2p  [facility] the [facility] [address] [place] [address] [phone] PANTRY mobile distribution sites; call agency for listing  [facility] [address] [place] [address] [phone] PANTRY M & W 6:30p - 8:30p  Pop up on Th 11a - 12:30p at different locations (call)  [facility] [address] [place] [address] [phone] [employer] K[employer] M & Th: 6p-9p  [facility] [address] [place] [address] [phone], [place] 3rd Thursday 2:30-3:30  [facility] [address] [place] [address]  [phone] PANTRY Tu: 9a - 11a; Th: 9a - 11a  [facility] [address] [place] [address] [phone] PANTRY 1st & 3rd Sa: 9a - 12p  [facility] [address] [place] [address] [phone] PANTRY Tu: 9a - 12p; Th 9a - 12p  [employer] [address] [place] [address] [phone] PANTRY 4th Saturday: 10a - 12p  [facility] [address] [place] [address] [phone] PANTRY W: 9a - 12p  [facility] [address] [employer] [facility] [address] [phone] PANTRY M - F: 11a-3p  [facility] [address] [place] [address] [phone] PANTRY Fridays 11am-2pm  [facility] [address] [place]  [address] [place] [address] [phone] PANTRY 3rd Tu: 5p - 7p; 3rd Sa: 9a - 1p  [employer] [facility] [address] [place] [address] [phone] PANTRY 3rd [place]: 9a-12p  [employer] [facility] [address] [place] [address] [phone] PANTRY Sa: 1p - 4p  [facility] [address]  [place]  [address] [phone] PANTRY 3rd Sat 10-12

## 2024-02-15 NOTE — Care Management (Signed)
 SDOH resources for food insecurities added to AVS

## 2024-02-15 NOTE — Progress Notes (Signed)
" °  Carryover admission to the Day Admitter.  I discussed this case with the EDP, Lonni Lites, PA.  Per these discussions:   This is a 64 year old male with history of chronic hypoxic respiratory failure tracheostomy collar, chronic left lower extremity edema, who is being admitted with acute on chronic hypoxic respiratory failure in the setting of RSV pneumonia # presenting with 3 days of shortness of breath, productive cough, subjective fever, with EMS noting the patient's oxygen saturations initially noted in the high 80s to 90% on his baseline tracheostomy collar.  Over the last 2 3 days, has also noted some intermittent nausea/vomiting, in the absence of any diarrhea and in the absence of any abdominal pain.  Overall, over the last 2 to 3 days, he notes diminished oral intake.  Vital signs in the ED notable for temperature max 100.7.  Initial troponin 30, with repeat trending down to 819, with EKG report showed no evidence of acute ischemic changes.  proBNP is reported to be not elevated relative to his baseline.  RSV PCR was found be positive.  CTA chest with PE protocol showed no evidence of acute pulmonary embolism but demonstrated evidence of right lower lobe infiltrate.  In the ED, he was started on Rocephin  and azithromycin  for potential community-acquired pneumonia given the right lower lobe infiltrate.  I have placed an order for inpatient admission to PCU for further evaluation and management of the above.  I have placed some additional preliminary admit orders via the adult multi-morbid admission order set. I have also ordered procalcitonin level to further assess for bacterial pneumonia superimposed on his RSV infection.  Pending the result of procalcitonin, I have continued the azithromycin  and Rocephin  initiated in the ED.  I have also ordered scheduled guaifenesin , as needed Tessalon  Perles, incentive spirometry, flutter valve in addition to morning labs to include CMP, CBC,  and magnesium  level.    Eva Pore, DO Hospitalist  "

## 2024-02-16 DIAGNOSIS — J9621 Acute and chronic respiratory failure with hypoxia: Secondary | ICD-10-CM | POA: Diagnosis not present

## 2024-02-16 DIAGNOSIS — J441 Chronic obstructive pulmonary disease with (acute) exacerbation: Secondary | ICD-10-CM | POA: Diagnosis not present

## 2024-02-16 DIAGNOSIS — Z87891 Personal history of nicotine dependence: Secondary | ICD-10-CM | POA: Diagnosis not present

## 2024-02-16 DIAGNOSIS — B974 Respiratory syncytial virus as the cause of diseases classified elsewhere: Secondary | ICD-10-CM | POA: Diagnosis not present

## 2024-02-16 DIAGNOSIS — Z930 Tracheostomy status: Secondary | ICD-10-CM | POA: Diagnosis not present

## 2024-02-16 DIAGNOSIS — G4733 Obstructive sleep apnea (adult) (pediatric): Secondary | ICD-10-CM | POA: Diagnosis not present

## 2024-02-16 DIAGNOSIS — J9620 Acute and chronic respiratory failure, unspecified whether with hypoxia or hypercapnia: Secondary | ICD-10-CM | POA: Diagnosis not present

## 2024-02-16 DIAGNOSIS — R001 Bradycardia, unspecified: Secondary | ICD-10-CM

## 2024-02-16 LAB — CBC WITH DIFFERENTIAL/PLATELET
Abs Immature Granulocytes: 0.03 10*3/uL (ref 0.00–0.07)
Basophils Absolute: 0 10*3/uL (ref 0.0–0.1)
Basophils Relative: 1 %
Eosinophils Absolute: 0 10*3/uL (ref 0.0–0.5)
Eosinophils Relative: 0 %
HCT: 43.6 % (ref 39.0–52.0)
Hemoglobin: 14.1 g/dL (ref 13.0–17.0)
Immature Granulocytes: 1 %
Lymphocytes Relative: 35 %
Lymphs Abs: 1.9 10*3/uL (ref 0.7–4.0)
MCH: 28 pg (ref 26.0–34.0)
MCHC: 32.3 g/dL (ref 30.0–36.0)
MCV: 86.7 fL (ref 80.0–100.0)
Monocytes Absolute: 0.5 10*3/uL (ref 0.1–1.0)
Monocytes Relative: 8 %
Neutro Abs: 3.1 10*3/uL (ref 1.7–7.7)
Neutrophils Relative %: 55 %
Platelets: 183 10*3/uL (ref 150–400)
RBC: 5.03 MIL/uL (ref 4.22–5.81)
RDW: 14.9 % (ref 11.5–15.5)
Smear Review: NORMAL
WBC: 5.6 10*3/uL (ref 4.0–10.5)
nRBC: 0 % (ref 0.0–0.2)

## 2024-02-16 LAB — GLUCOSE, CAPILLARY
Glucose-Capillary: 106 mg/dL — ABNORMAL HIGH (ref 70–99)
Glucose-Capillary: 137 mg/dL — ABNORMAL HIGH (ref 70–99)
Glucose-Capillary: 126 mg/dL — ABNORMAL HIGH (ref 70–99)
Glucose-Capillary: 142 mg/dL — ABNORMAL HIGH (ref 70–99)

## 2024-02-16 LAB — BASIC METABOLIC PANEL WITH GFR
Anion gap: 9 (ref 5–15)
BUN: 25 mg/dL — ABNORMAL HIGH (ref 8–23)
CO2: 28 mmol/L (ref 22–32)
Calcium: 8.8 mg/dL — ABNORMAL LOW (ref 8.9–10.3)
Chloride: 104 mmol/L (ref 98–111)
Creatinine, Ser: 0.94 mg/dL (ref 0.61–1.24)
GFR, Estimated: >60 mL/min
Glucose, Bld: 114 mg/dL — ABNORMAL HIGH (ref 70–99)
Potassium: 4.1 mmol/L (ref 3.5–5.1)
Sodium: 141 mmol/L (ref 135–145)

## 2024-02-16 LAB — MAGNESIUM: Magnesium: 2.4 mg/dL (ref 1.7–2.4)

## 2024-02-16 LAB — TSH: TSH: 0.347 u[IU]/mL — ABNORMAL LOW (ref 0.350–4.500)

## 2024-02-16 MED ORDER — INSULIN GLARGINE-YFGN 100 UNIT/ML ~~LOC~~ SOLN
15.0000 [IU] | Freq: Every day | SUBCUTANEOUS | Status: DC
  Administered 2024-02-16: 15 [IU] via SUBCUTANEOUS
  Filled 2024-02-16 (×2): qty 0.15

## 2024-02-16 MED ORDER — METOPROLOL TARTRATE 12.5 MG HALF TABLET
12.5000 mg | ORAL_TABLET | Freq: Two times a day (BID) | ORAL | Status: DC
  Administered 2024-02-16 – 2024-02-17 (×4): 12.5 mg via ORAL
  Filled 2024-02-16 (×4): qty 1

## 2024-02-16 MED ORDER — TORSEMIDE 20 MG PO TABS
20.0000 mg | ORAL_TABLET | Freq: Every day | ORAL | Status: DC
  Administered 2024-02-16 – 2024-02-18 (×3): 20 mg via ORAL
  Filled 2024-02-16 (×3): qty 1

## 2024-02-16 NOTE — Consult Note (Signed)
 "  NAME:  Timothy Le, MRN:  [id number], DOB:  04-14-1960, LOS: 1 ADMISSION DATE:  02/14/2024, CONSULTATION DATE:  02/15/24 REFERRING MD:  Claudene SAUNDERS, CHIEF COMPLAINT:  AonCRF s/p trach  History of Present Illness:  Timothy Le is a 64 yo male known to PCCM with past medical history significant for morbid obesity, HTN, CHF, COPD, OHS, chronic hypoxic respiratory failure s/p tracheostomy not on baseline oxygen, DM2, DVT on Eliquis , OSA, who presented to ED with progressive dyspnea over the last several days. In the ED his SpO2 was 90% on RA and he was initially placed on 7L via TC without improvement and then increased to 11L. Labs notable for RSV+, troponin 20, CO2 36 on vbg. CTA was negative for PE, but noted patchy infiltrate to RLL without associated effusion. He was started on triple therapy nebs, prednisone , and Azithromycin /Ceftriaxone  for CAP coverage. Patient is admitted to TRH. PCCM consulted for pulmonary evaluation in setting of tracheostomy with increasing oxygen requirements.  Will follow-along.  Pertinent Medical History:   Past Medical History:  Diagnosis Date   Asthma    Chronic respiratory failure with hypoxia (HCC) 07/09/2017   COPD (chronic obstructive pulmonary disease) (HCC)    Diabetes mellitus without complication (HCC)    Difficult intubation    Hypertension    Severe obesity (BMI >= 40) (HCC)    Shortness of breath dyspnea    Sleep apnea    Tracheobronchitis    Significant Hospital Events: Including procedures, antibiotic start and stop dates in addition to other pertinent events   1/26: ED TRH dyspnea, RSV+, inc FiO2 via TC  Interim History / Subjective:  Doing well.  On 28% FiO2 on trach collar.  Objective    Blood pressure 126/67, pulse (!) 57, temperature 97.9 F (36.6 C), temperature source Oral, resp. rate (!) 24, height 5' 8 (1.727 m), weight (!) 148.4 kg, SpO2 94%.    FiO2 (%):  [28 %-50 %] 28 %   Intake/Output Summary (Last 24 hours) at  02/16/2024 1653 Last data filed at 02/16/2024 1652 Gross per 24 hour  Intake 240 ml  Output 2750 ml  Net -2510 ml   Filed Weights   02/14/24 2227 02/16/24 0443  Weight: (!) 152 kg (!) 148.4 kg   Examination: General: Elderly male with tracheostomy in place and PMV.  Appears comfortable.  Morbidly obese. Lungs: Clear lungs. Heart: regular rate rhythm, no murmur appreciated.  Abdomen: non tender, non distended. Normal BS.  Neuro: Alert and oriented and talking to me with PMV valve.  Resolved Problem List:   Assessment and Plan:   Acute on chronic respiratory failure status post chronic tracheostomy: RSV positive with possible pneumonia: COPD E: OSA/OHS: -Has 4 UN 65R Shiley - Feeling better.  On trach collar at 28% - Continue antibiotics with ceftriaxone  and azithromycin . - Continue ICS/LAMA/LABA.  As needed albuterol .  On Dulera  at home. - Continue prednisone  x 5 days. - SpO2 goal 88-92% with underlying COPD. - I-S.  Pulmonary will sign off.  Please call with questions.  Labs:  CBC: Recent Labs  Lab 02/14/24 2226 02/14/24 2229 02/15/24 0349 02/16/24 0730  WBC 6.5  --  5.8 5.6  NEUTROABS  --   --  3.0 3.1  HGB 14.9 15.6 14.3 14.1  HCT 47.3 46.0 44.8 43.6  MCV 88.7  --  89.2 86.7  PLT 184  --  165 183    Basic Metabolic Panel: Recent Labs  Lab 02/14/24 2226 02/14/24 2229 02/15/24 0349  02/16/24 0730  NA 137 139 138 141  K 3.9 3.9 4.0 4.1  CL 99  --  100 104  CO2 24  --  26 28  GLUCOSE 136*  --  114* 114*  BUN 17  --  17 25*  CREATININE 1.01  --  0.98 0.94  CALCIUM  8.9  --  8.9 8.8*  MG  --   --  2.2 2.4   GFR: Estimated Creatinine Clearance: 115.7 mL/min (by C-G formula based on SCr of 0.94 mg/dL). Recent Labs  Lab 02/14/24 2226 02/15/24 0349 02/16/24 0730  PROCALCITON  --  0.21  --   WBC 6.5 5.8 5.6    Liver Function Tests: Recent Labs  Lab 02/14/24 2226 02/15/24 0349  AST 36 31  ALT 29 29  ALKPHOS 136* 130*  BILITOT 1.2 0.9  PROT  7.8 7.4  ALBUMIN  3.8 3.8   No results for input(s): LIPASE, AMYLASE in the last 168 hours. No results for input(s): AMMONIA in the last 168 hours.  ABG    Component Value Date/Time   PHART 7.444 08/24/2019 0514   PCO2ART 50.3 (H) 08/24/2019 0514   PO2ART 123 (H) 08/24/2019 0514   HCO3 27.7 02/14/2024 2229   TCO2 29 02/14/2024 2229   ACIDBASEDEF 1.0 07/30/2019 1724   O2SAT 97 02/14/2024 2229     Coagulation Profile: No results for input(s): INR, PROTIME in the last 168 hours.  Cardiac Enzymes: No results for input(s): CKTOTAL, CKMB, CKMBINDEX, TROPONINI in the last 168 hours.  HbA1C: Hemoglobin A1C  Date/Time Value Ref Range Status  09/24/2023 11:20 AM 6.1 (A) 4.0 - 5.6 % Final  06/23/2023 02:09 PM 6.1 (A) 4.0 - 5.6 % Final   HbA1c, POC (prediabetic range)  Date/Time Value Ref Range Status  05/02/2021 08:34 AM 12.1 (A) 5.7 - 6.4 % Final  01/23/2021 10:31 AM 11.5 (A) 5.7 - 6.4 % Final   HbA1c, POC (controlled diabetic range)  Date/Time Value Ref Range Status  05/02/2021 08:34 AM 12.1 (A) 0.0 - 7.0 % Final  01/23/2021 10:31 AM 11.5 (A) 0.0 - 7.0 % Final   HbA1c POC (<> result, manual entry)  Date/Time Value Ref Range Status  05/02/2021 08:34 AM 12.1 4.0 - 5.6 % Final  01/23/2021 10:31 AM 11.5 4.0 - 5.6 % Final   Hgb A1c MFr Bld  Date/Time Value Ref Range Status  01/24/2024 11:52 AM 6.8 (H) 4.8 - 5.6 % Final    Comment:             Prediabetes: 5.7 - 6.4          Diabetes: >6.4          Glycemic control for adults with diabetes: <7.0    CBG: Recent Labs  Lab 02/15/24 1641 02/15/24 2119 02/16/24 0842 02/16/24 1236 02/16/24 1615  GLUCAP 145* 161* 106* 137* 126*    Review of Systems:   Review of systems completed with pertinent positives/negatives outlined in above HPI.  Past Medical History:  He,  has a past medical history of Asthma, Chronic respiratory failure with hypoxia (HCC) (07/09/2017), COPD (chronic obstructive pulmonary  disease) (HCC), Diabetes mellitus without complication (HCC), Difficult intubation, Hypertension, Severe obesity (BMI >= 40) (HCC), Shortness of breath dyspnea, Sleep apnea, and Tracheobronchitis.   Surgical History:   Past Surgical History:  Procedure Laterality Date   CORONARY ULTRASOUND/IVUS Left 12/11/2019   Procedure: Intravascular Ultrasound/IVUS;  Surgeon: Sheree Penne Bruckner, MD;  Location: [facility] INVASIVE CV LAB;  Service: Cardiovascular;  Laterality:  Left;  Left lower extremity venous   IR GASTROSTOMY TUBE MOD SED  07/05/2017   IVC VENOGRAPHY N/A 01/10/2021   Procedure: IVC VENOGRAPHY - VENA CAVA;  Surgeon: Eliza Lonni RAMAN, MD;  Location: [facility] INVASIVE CV LAB;  Service: Cardiovascular;  Laterality: N/A;   LOWER EXTREMITY VENOGRAPHY N/A 01/10/2021   Procedure: LOWER EXTREMITY VENOGRAPHY;  Surgeon: Eliza Lonni RAMAN, MD;  Location: [facility] INVASIVE CV LAB;  Service: Cardiovascular;  Laterality: N/A;   PERIPHERAL VASCULAR THROMBECTOMY Left 12/11/2019   Procedure: PERIPHERAL VASCULAR THROMBECTOMY;  Surgeon: Sheree Penne Lonni, MD;  Location: [facility] INVASIVE CV LAB;  Service: Cardiovascular;  Laterality: Left;   THYROIDECTOMY Left 08/30/2019   Procedure: THYROIDECTOMY CERVICAL LIPECTOMY;  Surgeon: Jesus Oliphant, MD;  Location: [facility] OR;  Service: ENT;  Laterality: Left;  NEEDS RNFA PLEASE   TRACHEOSTOMY TUBE PLACEMENT N/A 06/17/2017   Procedure: TRACHEOSTOMY;  Surgeon: Karis Clunes, MD;  [facility] OR;  Service: ENT;  Laterality: N/A;   TRACHEOSTOMY TUBE PLACEMENT N/A 08/14/2019   Procedure: TRACHEOSTOMY;  Surgeon: Jesus Oliphant, MD;  Location: [facility] OR;  Service: ENT;  Laterality: N/A;   TRACHEOSTOMY TUBE PLACEMENT N/A 08/30/2019   Procedure: TRACHEOSTOMY  REVISION;  Surgeon: Jesus Oliphant, MD;  Location: [facility] OR;  Service: ENT;  Laterality: N/A;     Social History:   reports that he quit smoking about 10 years ago. His smoking use included cigarettes. He started smoking about 40 years ago.  He has a 14.8 pack-year smoking history. He has never used smokeless tobacco. He reports that he does not drink alcohol and does not use drugs.   Family History:  His family history includes Asthma in his sister and another family member; Diabetes type II in his sister; Hypertension in his sister.   Allergies Allergies[1]   Home Medications  Prior to Admission medications  Medication Sig Start Date End Date Taking? Authorizing Provider  albuterol  (VENTOLIN  HFA) 108 (90 Base) MCG/ACT inhaler Inhale 2 puffs into the lungs every 6 (six) hours as needed for wheezing or shortness of breath. 03/09/23  Yes Young, Reggy D, MD  allopurinol  (ZYLOPRIM ) 100 MG tablet Take 1 tablet (100 mg total) by mouth daily. 09/24/23  Yes [name], [name], FNP  apixaban  (ELIQUIS ) 5 MG TABS tablet Take 1 tablet (5 mg total) by mouth 2 (two) times daily. 09/13/23  Yes [name], [name], FNP  atorvastatin  (LIPITOR) 80 MG tablet Take 1 tablet (80 mg total) by mouth daily. 01/25/24  Yes [name], [name], FNP  desonide  (DESOWEN ) 0.05 % cream Apply topically 2 (two) times daily. For dry and peeling skin. Use for 2 weeks. 01/07/24  Yes [name], [name], FNP  diclofenac  Sodium (VOLTAREN ) 1 % GEL Apply 2 g topically 4 (four) times daily. Patient taking differently: Apply 2 g topically 4 (four) times daily as needed (pain). 09/24/23  Yes [name], [name], FNP  empagliflozin  (JARDIANCE ) 25 MG TABS tablet Take 1 tablet (25 mg total) by mouth daily before breakfast. 06/23/23  Yes [name], [name], FNP  gabapentin  (NEURONTIN ) 100 MG capsule Take 1 capsule (100 mg total) by mouth daily. 02/15/23  Yes [name], [name], FNP  insulin  glargine (LANTUS  SOLOSTAR) 100 UNIT/ML Solostar Pen Inject 36 units into the skin once daily. May titrate up to 44 units daily if instructed by your provider. Patient taking differently: Inject 26 Units into the skin daily. May titrate up to 44 units daily if instructed by your provider. 08/23/23  Yes  [name], [name], FNP  metFORMIN  (GLUCOPHAGE -XR) 500 MG 24 hr tablet Take 1 tablet (500 mg total) by mouth in the morning and at bedtime. 09/24/23  Yes [name], [name], FNP  metoprolol  tartrate (LOPRESSOR ) 25 MG tablet Take 0.5 tablets (12.5 mg total) by mouth 2 (two) times daily. 09/13/23  Yes [name], [name], FNP  mometasone -formoterol  (DULERA ) 100-5 MCG/ACT AERO Inhale 2 puffs into the lungs 2 (two) times daily. 09/24/23  Yes [name], [name], FNP  QUEtiapine  (SEROQUEL ) 100 MG tablet Take 1 tablet (100 mg total) by mouth 2 (two) times daily. 09/13/23  Yes [name], [name], FNP  Semaglutide , 2 MG/DOSE, 8 MG/3ML SOPN Inject 2 mg into the skin once a week. 01/25/24  Yes [name], [name], FNP  sertraline  (ZOLOFT ) 100 MG tablet Take 1 tablet (100 mg total) by mouth daily. 01/07/24  Yes [name], [name], FNP  torsemide  (DEMADEX ) 10 MG tablet Take 1 tablet (10 mg total) by mouth daily. 09/13/23  Yes [name], [name], FNP  glucose blood (ACCU-CHEK GUIDE TEST) test strip Use as instructed 10/08/23   [name], [name], FNP  Insulin  Pen Needle (PEN NEEDLES) 32G X 4 MM MISC Use to inject Lantus  daily 09/24/23   [name], [name], FNP  Total care time: 34 minutes   Care time was exclusive of separately billable procedures and treating other patients.  Care was necessary to treat or prevent imminent or life-threatening deterioration.   Care was time spent personally by me on the following activities: development of treatment plan with patient and/or surrogate as well as nursing, discussions with consultants, evaluation of patient's response to treatment, examination of patient, obtaining history from patient or surrogate, ordering and performing treatments and interventions, ordering and review of laboratory studies, ordering and review of radiographic studies and pulse oximetry.   Sammi JONETTA Fredericks, MD Pulmonary, Critical Care and Sleep Attending.   02/16/2024, 4:56 PM       [1] No  Known Allergies  "

## 2024-02-16 NOTE — Significant Event (Signed)
 Patient noted to have bradycardia.  EKG shows bradycardia with missing P waves occasionally concerning for second or third-degree AV block.  Patient asymptomatic blood pressure systolic is in the 140s.  I reviewed patient's labs notes and medications.  I consulted cardiology.  Patient not on any rate limiting medications.  Will check a metabolic panel and level and TSH.  Redia Cleaver MD.

## 2024-02-16 NOTE — Plan of Care (Signed)

## 2024-02-16 NOTE — Progress Notes (Signed)
 Patient has been having episodes of HR dropping to 38bpm nonsustained. Patient is asleep but arousable He denies any associated symptoms and says he is feeling fine. Current BP 120/76. Dr. Franky made aware.   Saarah Dewing, RN

## 2024-02-16 NOTE — Procedures (Signed)
 Bedside Tracheostomy Insertion Procedure Note   Patient Details:   Name: Timothy Le DOB: May 23, 1960 MRN: [id number]  Procedure: Tracheostomy   Breath Sounds: Clear and Diminished  Post Procedure Assessment: BP (!) 158/87 (BP Location: Left Wrist)   Pulse 64   Temp 97.7 F (36.5 C) (Oral)   Resp 18   Ht 5' 8 (1.727 m)   Wt (!) 148.4 kg   SpO2 97%   BMI 49.74 kg/m  O2 sats: stable throughout Complications: No apparent complications Patient did tolerate procedure well Tracheostomy Brand:Shiley Tracheostomy Style:Uncuffed Tracheostomy Size: #4 Tracheostomy Secured cpj:Czormn Tracheostomy Placement Confirmation:Trach cuff visualized and in place  RT x2 changed trach to another #4 shiley uncuffed. Color change noted. No complications.     Dena VEAR Samuel 02/16/2024, 3:31 PM

## 2024-02-16 NOTE — Progress Notes (Signed)
 " PROGRESS NOTE    Timothy Le  FMW:[id number] DOB: 04-Mar-1960 DOA: 02/14/2024 PCP: Juanice Thomes SAUNDERS, FNP   63/M with chronic hypoxic and hypercarbic respiratory failure, COPD, OSA OHS, tracheostomy for few years, diastolic CHF,, DM2, DVT on chronic anticoagulation, gout, OSA, OHS, and morbid obesity presented to the ER with shortness of breath and productive cough.,  Also reported congestion and headaches and wheezing.  In the ED he was febrile, hypoxic placed on 7 followed by 10 L O2, troponin 20, BNP normal, chest x-ray with left basilar opacity, RSV positive, CT angiogram of the chest noted no pulmonary embolism and a patchy infiltrate in the right lower lung without associated effusion.   Subjective: -Feels better overall, breathing is improving  Assessment and Plan:  Acute on chronic respiratory failure with hypoxia secondary to RSV pneumonia Tracheostomy dependent - CTA chest noted patchy right lower lobe infiltrate, borderline elevated procalcitonin -Started on empiric Rocephin , azithromycin  -sp IV steroids, now switched to prednisone  -Continue Mucinex , Pulmicort , Brovana  - Increase activity, pulmonary toilet, wean O2   COPD exacerbation -As above   Chronic diastolic CHF -last echocardiogram noted EF to be 50 to 55% with indeterminate diastolic parameters when last checked back in 2021.   - Continue torsemide , will increase dose   Essential hypertension Blood pressures currently maintained. - Continue metoprolol  and torsemide    Controlled diabetes mellitus type 2 with long-term use of insulin  - Last hemoglobin A1c noted to be 6.8  on 01/24/2024. - Hypoglycemic protocols - Hold metformin  - Continue Jardiance  and Semglee , CBGs are stable   History of DVT on chronic anticoagulation Has a history of left lower extremity DVT on chronic anticoagulation. - Continue Eliquis    Dyslipidemia - Continue atorvastatin    Anxiety   - Continue Zoloft    Gout - Continue  allopurinol    Morbid obesity BMI 50.94 kg/m  DVT prophylaxis: Eliquis  Advance Care Planning:   Code Status: Full Code     Consults: None   Family Communication: None Disposition Plan: Home likely 48 hours pending improvement in oxygen requirements  Consultants:    Procedures:   Antimicrobials:    Objective: Vitals:   02/16/24 0443 02/16/24 0515 02/16/24 0836 02/16/24 0857  BP: (!) 148/72 120/76 (!) 154/87   Pulse: (!) 47 (!) 50 76   Resp: 18  20 18   Temp: (!) 97.5 F (36.4 C)  97.8 F (36.6 C)   TempSrc: Oral  Oral   SpO2: 97% (!) 89% 99%   Weight: (!) 148.4 kg     Height:        Intake/Output Summary (Last 24 hours) at 02/16/2024 1019 Last data filed at 02/16/2024 0836 Gross per 24 hour  Intake 240 ml  Output 850 ml  Net -610 ml   Filed Weights   02/14/24 2227 02/16/24 0443  Weight: (!) 152 kg (!) 148.4 kg    Examination:  General exam: Appears calm and comfortable obese, chronically ill, AAO x 3 HEENT: Trach collar Respiratory system: Poor air movement bilaterally, conducted upper airway sounds Cardiovascular system: S1 & S2 heard, RRR.  Abd: nondistended, soft and nontender.Normal bowel sounds heard. Central nervous system: Alert and oriented. No focal neurological deficits. Extremities: no edema Skin: No rashes Psychiatry:  Mood & affect appropriate.     Data Reviewed:   CBC: Recent Labs  Lab 02/14/24 2226 02/14/24 2229 02/15/24 0349 02/16/24 0730  WBC 6.5  --  5.8 5.6  NEUTROABS  --   --  3.0 3.1  HGB 14.9  15.6 14.3 14.1  HCT 47.3 46.0 44.8 43.6  MCV 88.7  --  89.2 86.7  PLT 184  --  165 183   Basic Metabolic Panel: Recent Labs  Lab 02/14/24 2226 02/14/24 2229 02/15/24 0349 02/16/24 0730  NA 137 139 138 141  K 3.9 3.9 4.0 4.1  CL 99  --  100 104  CO2 24  --  26 28  GLUCOSE 136*  --  114* 114*  BUN 17  --  17 25*  CREATININE 1.01  --  0.98 0.94  CALCIUM  8.9  --  8.9 8.8*  MG  --   --  2.2 2.4   GFR: Estimated  Creatinine Clearance: 115.7 mL/min (by C-G formula based on SCr of 0.94 mg/dL). Liver Function Tests: Recent Labs  Lab 02/14/24 2226 02/15/24 0349  AST 36 31  ALT 29 29  ALKPHOS 136* 130*  BILITOT 1.2 0.9  PROT 7.8 7.4  ALBUMIN  3.8 3.8   No results for input(s): LIPASE, AMYLASE in the last 168 hours. No results for input(s): AMMONIA in the last 168 hours. Coagulation Profile: No results for input(s): INR, PROTIME in the last 168 hours. Cardiac Enzymes: No results for input(s): CKTOTAL, CKMB, CKMBINDEX, TROPONINI in the last 168 hours. BNP (last 3 results) Recent Labs    02/14/24 2226  PROBNP 168.0   HbA1C: No results for input(s): HGBA1C in the last 72 hours. CBG: Recent Labs  Lab 02/15/24 1144 02/15/24 1641 02/15/24 2119 02/16/24 0842  GLUCAP 108* 145* 161* 106*   Lipid Profile: No results for input(s): CHOL, HDL, LDLCALC, TRIG, CHOLHDL, LDLDIRECT in the last 72 hours. Thyroid  Function Tests: Recent Labs    02/16/24 0730  TSH 0.347*   Anemia Panel: No results for input(s): VITAMINB12, FOLATE, FERRITIN, TIBC, IRON, RETICCTPCT in the last 72 hours. Urine analysis:    Component Value Date/Time   COLORURINE YELLOW 07/30/2019 1743   APPEARANCEUR HAZY (A) 07/30/2019 1743   LABSPEC 1.026 07/30/2019 1743   PHURINE 5.0 07/30/2019 1743   GLUCOSEU >=500 (A) 07/30/2019 1743   HGBUR LARGE (A) 07/30/2019 1743   BILIRUBINUR neg 04/11/2020 1137   KETONESUR negative 01/11/2020 0929   KETONESUR NEGATIVE 07/30/2019 1743   PROTEINUR Negative 04/11/2020 1137   PROTEINUR 100 (A) 07/30/2019 1743   UROBILINOGEN 1.0 04/11/2020 1137   NITRITE neg 04/11/2020 1137   NITRITE NEGATIVE 07/30/2019 1743   LEUKOCYTESUR Negative 04/11/2020 1137   LEUKOCYTESUR NEGATIVE 07/30/2019 1743   Sepsis Labs: @LABRCNTIP (procalcitonin:4,lacticidven:4)  ) Recent Results (from the past 240 hours)  Resp panel by RT-PCR (RSV, Flu A&B, Covid) Anterior  Nasal Swab     Status: Abnormal   Collection Time: 02/14/24 10:25 PM   Specimen: Anterior Nasal Swab  Result Value Ref Range Status   SARS Coronavirus 2 by RT PCR NEGATIVE NEGATIVE Final   Influenza A by PCR NEGATIVE NEGATIVE Final   Influenza B by PCR NEGATIVE NEGATIVE Final    Comment: (NOTE) The Xpert Xpress SARS-CoV-2/FLU/RSV plus assay is intended as an aid in the diagnosis of influenza from Nasopharyngeal swab specimens and should not be used as a sole basis for treatment. Nasal washings and aspirates are unacceptable for Xpert Xpress SARS-CoV-2/FLU/RSV testing.  Fact Sheet for Patients: bloggercourse.com  Fact Sheet for Healthcare Providers: seriousbroker.it  This test is not yet approved or cleared by the [place]  FDA and has been authorized for detection and/or diagnosis of SARS-CoV-2 by FDA under an Emergency Use Authorization (EUA). This EUA will remain in effect (  meaning this test can be used) for the duration of the COVID-19 declaration under Section 564(b)(1) of the Act, 21 U.S.C. section 360bbb-3(b)(1), unless the authorization is terminated or revoked.     Resp Syncytial Virus by PCR POSITIVE (A) NEGATIVE Final    Comment: (NOTE) Fact Sheet for Patients: bloggercourse.com  Fact Sheet for Healthcare Providers: seriousbroker.it  This test is not yet approved or cleared by the [place]  FDA and has been authorized for detection and/or diagnosis of SARS-CoV-2 by FDA under an Emergency Use Authorization (EUA). This EUA will remain in effect (meaning this test can be used) for the duration of the COVID-19 declaration under Section 564(b)(1) of the Act, 21 U.S.C. section 360bbb-3(b)(1), unless the authorization is terminated or revoked.  Performed at [facility], [address] [place], [place] [address]      Radiology Studies: CT Angio Chest  PE W and/or Wo Contrast Result Date: 02/15/2024 EXAM: CTA of the Chest without and with IV contrast for PE 02/15/2024 12:33:10 AM TECHNIQUE: CTA of the chest was performed after the administration of 75 mL of iohexol  (OMNIPAQUE ) 350 MG/ML injection. Multiplanar reformatted images are provided for review. MIP images are provided for review. Automated exposure control, iterative reconstruction, and/or weight based adjustment of the mA/kV was utilized to reduce the radiation dose to as low as reasonably achievable. COMPARISON: 01/07/2021, chest x-ray from the previous day. CLINICAL HISTORY: Cold symptoms for several days with shortness of breath. FINDINGS: PULMONARY ARTERIES: No filling defect is noted to suggest pulmonary embolism. The pulmonary artery shows a normal branching pattern bilaterally. Main pulmonary artery is normal in caliber. MEDIASTINUM: The heart and pericardium demonstrate no acute abnormality. Tracheostomy tube is noted in satisfactory position. The right lobe of the thyroid  is prominent. The left lobe has been surgically removed. The aorta demonstrates mild atherosclerotic calcification. No aneurysmal dilatation or dissection is noted. LYMPH NODES: No mediastinal, hilar or axillary lymphadenopathy. LUNGS AND PLEURA: Lungs are well aerated bilaterally. Patchy infiltrate is noted in the right lower lobe without associated effusion. No parenchymal nodules are noted. No pneumothorax. UPPER ABDOMEN: The visualized upper abdomen is within normal limits. SOFT TISSUES AND BONES: Degenerative changes of the thoracic spine are noted. No rib abnormality is noted. No acute soft tissue abnormality. IMPRESSION: 1. No pulmonary embolism. 2. Patchy infiltrate in the right lower lobe without associated effusion. Electronically signed by: Oneil Devonshire MD 02/15/2024 12:44 AM EST RP Workstation: [id number]   DG Chest Portable 1 View Result Date: 02/14/2024 EXAM: 1 VIEW XRAY OF THE CHEST 02/14/2024 10:53:00 PM  COMPARISON: Chest x-ray 09/07/2022, CT chest 01/07/2021. CLINICAL HISTORY: Shortness of breath, chest pain. FINDINGS: LINES, TUBES AND DEVICES: Tracheostomy in place. LUNGS AND PLEURA: Question: Left basilar airspace opacity. No pleural effusion. No pneumothorax. HEART AND MEDIASTINUM: Following cardiac silhouette likely due to AP orbital technique. Otherwise unremarkable mediastinal silhouette. BONES AND SOFT TISSUES: No acute osseous abnormality. IMPRESSION: 1. Question left basilar airspace opacity. Recommend further evaluation with chest x-ray PA and lateral views. 2. Tracheostomy tube in place. Electronically signed by: Morgane Naveau MD 02/14/2024 10:57 PM EST RP Workstation: [id number]     Scheduled Meds:  allopurinol   100 mg Oral Daily   apixaban   5 mg Oral BID   arformoterol   15 mcg Nebulization BID   atorvastatin   80 mg Oral Daily   azithromycin   500 mg Oral Daily   budesonide  (PULMICORT ) nebulizer solution  0.5 mg Nebulization BID   empagliflozin   25 mg Oral QAC breakfast  gabapentin   100 mg Oral Daily   guaiFENesin   600 mg Oral BID   insulin  aspart  0-5 Units Subcutaneous QHS   insulin  aspart  0-9 Units Subcutaneous TID WC   insulin  glargine-yfgn  26 Units Subcutaneous Daily   predniSONE   40 mg Oral Q breakfast   revefenacin   175 mcg Nebulization Daily   sertraline   100 mg Oral Daily   torsemide   10 mg Oral Daily   Continuous Infusions:  cefTRIAXone  (ROCEPHIN )  IV 2 g (02/15/24 1816)     LOS: 1 day    Time spent:    Sigurd Pac, MD [facility]   02/16/2024, 10:19 AM    "

## 2024-02-16 NOTE — Consult Note (Signed)
 "  Cardiology Consultation   Patient ID: ZAYLYN BERGDOLL MRN: [id number]; DOB: 04-12-1960  Admit date: 02/14/2024 Date of Consult: 02/16/2024  PCP:  [name], [name], FNP   [facility] Providers Cardiologist:  New, Dr. Jordan    Patient Profile: Timothy Le is a 64 y.o. male with a hx of hypoxic respiratory failure status post tracheostomy, COPD, diabetes, hypertension, severe obesity, DVT on chronic anticoagulation who is being seen 02/16/2024 for the evaluation of bradycardia at the request of Dr Franky.  History of Present Illness: Timothy Le is a 64 year old male with past medical history noted above.  He has never been evaluated by cardiology in the past.  Presented to the ED 1/26 with complaints of worsening shortness of breath and productive cough.  Reported that his roommate was recently sick.  Denied any fever or chills but did complain of diaphoresis and headache with sinus congestion.  In the ED O2 sats initially 90% on room air was placed on 7 L via trach collar.  Labs were notable for RSV positive, CTA negative for PE but noted patchy infiltrate in the left lower lobe without associated effusion.  He was started on nebs, prednisone  and antibiotics.  Admitted to internal medicine for further management.  Cardiology asked to evaluate for episode of bradycardia noted this morning around 6 AM.  Patient was reported to be asleep but easily awakened.  In review of telemetry he was noted to have episode of sinus bradycardia heart rate in the upper 40s with PACs.  No concerning high degree AV block noted.   Past Medical History:  Diagnosis Date   Asthma    Chronic respiratory failure with hypoxia (HCC) 07/09/2017   COPD (chronic obstructive pulmonary disease) (HCC)    Diabetes mellitus without complication (HCC)    Difficult intubation    Hypertension    Severe obesity (BMI >= 40) (HCC)    Shortness of breath dyspnea    Sleep apnea    Tracheobronchitis      Past Surgical History:  Procedure Laterality Date   CORONARY ULTRASOUND/IVUS Left 12/11/2019   Procedure: Intravascular Ultrasound/IVUS;  Surgeon: Sheree Penne Bruckner, MD;  Location: [facility] INVASIVE CV LAB;  Service: Cardiovascular;  Laterality: Left;  Left lower extremity venous   IR GASTROSTOMY TUBE MOD SED  07/05/2017   IVC VENOGRAPHY N/A 01/10/2021   Procedure: IVC VENOGRAPHY - VENA CAVA;  Surgeon: Eliza Bruckner GORMAN, MD;  Location: [facility] INVASIVE CV LAB;  Service: Cardiovascular;  Laterality: N/A;   LOWER EXTREMITY VENOGRAPHY N/A 01/10/2021   Procedure: LOWER EXTREMITY VENOGRAPHY;  Surgeon: Eliza Bruckner GORMAN, MD;  Location: [facility] INVASIVE CV LAB;  Service: Cardiovascular;  Laterality: N/A;   PERIPHERAL VASCULAR THROMBECTOMY Left 12/11/2019   Procedure: PERIPHERAL VASCULAR THROMBECTOMY;  Surgeon: Sheree Penne Bruckner, MD;  Location: [facility] INVASIVE CV LAB;  Service: Cardiovascular;  Laterality: Left;   THYROIDECTOMY Left 08/30/2019   Procedure: THYROIDECTOMY CERVICAL LIPECTOMY;  Surgeon: Jesus Oliphant, MD;  Location: [facility] OR;  Service: ENT;  Laterality: Left;  NEEDS RNFA PLEASE   TRACHEOSTOMY TUBE PLACEMENT N/A 06/17/2017   Procedure: TRACHEOSTOMY;  Surgeon: Karis Clunes, MD;  Location: [facility] OR;  Service: ENT;  Laterality: N/A;   TRACHEOSTOMY TUBE PLACEMENT N/A 08/14/2019   Procedure: TRACHEOSTOMY;  Surgeon: Jesus Oliphant, MD;  Location: [facility] OR;  Service: ENT;  Laterality: N/A;   TRACHEOSTOMY TUBE PLACEMENT N/A 08/30/2019   Procedure: TRACHEOSTOMY  REVISION;  Surgeon: Jesus Oliphant, MD;  Location: [facility] OR;  Service: ENT;  Laterality:  N/A;       Scheduled Meds:  allopurinol   100 mg Oral Daily   apixaban   5 mg Oral BID   arformoterol   15 mcg Nebulization BID   atorvastatin   80 mg Oral Daily   azithromycin   500 mg Oral Daily   budesonide  (PULMICORT ) nebulizer solution  0.5 mg Nebulization BID   empagliflozin   25 mg Oral QAC breakfast   gabapentin   100 mg Oral Daily   guaiFENesin   600 mg  Oral BID   insulin  aspart  0-5 Units Subcutaneous QHS   insulin  aspart  0-9 Units Subcutaneous TID WC   insulin  glargine-yfgn  15 Units Subcutaneous Daily   predniSONE   40 mg Oral Q breakfast   revefenacin   175 mcg Nebulization Daily   sertraline   100 mg Oral Daily   torsemide   20 mg Oral Daily   Continuous Infusions:  cefTRIAXone  (ROCEPHIN )  IV 2 g (02/15/24 1816)   PRN Meds: acetaminophen  **OR** acetaminophen , albuterol , benzonatate , melatonin, ondansetron  (ZOFRAN ) IV  Allergies:   Allergies[1]  Social History:   Social History   Socioeconomic History   Marital status: Legally Separated    Spouse name: Not on file   Number of children: 1   Years of education: Not on file   Highest education level: Not on file  Occupational History   Occupation: unemployed    Comment: working on disablity  Tobacco Use   Smoking status: Former    Current packs/day: 0.00    Average packs/day: 0.5 packs/day for 29.6 years (14.8 ttl pk-yrs)    Types: Cigarettes    Start date: 07/28/1983    Quit date: 02/27/2013    Years since quitting: 10.9   Smokeless tobacco: Never  Vaping Use   Vaping status: Never Used  Substance and Sexual Activity   Alcohol use: No    Comment: quit ETOH about 5 months ago-beer and liquor   Drug use: No    Comment: quit 6 months-pot-smoked couple joints a week   Sexual activity: Not Currently  Other Topics Concern   Not on file  Social History Narrative   Lives home alone   Social Drivers of Health   Tobacco Use: Medium Risk (02/14/2024)   Patient History    Smoking Tobacco Use: Former    Smokeless Tobacco Use: Never    Passive Exposure: Not on Actuary Strain: Not on file  Food Insecurity: Food Insecurity Present (02/15/2024)   Epic    Worried About Programme Researcher, Broadcasting/film/video in the Last Year: Sometimes true    Ran Out of Food in the Last Year: Sometimes true  Transportation Needs: No Transportation Needs (02/15/2024)   Epic    Lack of  Transportation (Medical): No    Lack of Transportation (Non-Medical): No  Physical Activity: Not on file  Stress: Not on file  Social Connections: Not on file  Intimate Partner Violence: Not At Risk (02/15/2024)   Epic    Fear of Current or Ex-Partner: No    Emotionally Abused: No    Physically Abused: No    Sexually Abused: No  Depression (PHQ2-9): Low Risk (01/24/2024)   Depression (PHQ2-9)    PHQ-2 Score: 1  Alcohol Screen: Not on file  Housing: Low Risk (02/15/2024)   Epic    Unable to Pay for Housing in the Last Year: No    Number of Times Moved in the Last Year: 0    Homeless in the Last Year: No  Utilities: Not At Risk (  02/15/2024)   Epic    Threatened with loss of utilities: No  Health Literacy: Not on file    Family History:    Family History  Problem Relation Age of Onset   Asthma Sister    Asthma Other        nephew   Hypertension Sister    Diabetes type II Sister      ROS:  Please see the history of present illness.   All other ROS reviewed and negative.     Physical Exam/Data: Vitals:   02/16/24 0836 02/16/24 0857 02/16/24 1127 02/16/24 1216  BP: (!) 154/87   (!) 158/87  Pulse: 76   62  Resp: 20 18 18 18   Temp: 97.8 F (36.6 C)   97.7 F (36.5 C)  TempSrc: Oral   Oral  SpO2: 99%   97%  Weight:      Height:        Intake/Output Summary (Last 24 hours) at 02/16/2024 1412 Last data filed at 02/16/2024 0836 Gross per 24 hour  Intake 240 ml  Output 850 ml  Net -610 ml      02/16/2024    4:43 AM 02/14/2024   10:27 PM 01/24/2024   11:08 AM  Last 3 Weights  Weight (lbs) 327 lb 2.6 oz 335 lb 334 lb  Weight (kg) 148.4 kg 151.955 kg 151.501 kg     Body mass index is 49.74 kg/m.  General: Chronically ill male, trach dependent HEENT: normal Neck: Difficult to assess Vascular: No carotid bruits; Distal pulses 2+ bilaterally Cardiac:  normal S1, S2; RRR; no murmur  Lungs: Diminished anteriorly Abd: soft, nontender, no hepatomegaly  Ext: no  edema Musculoskeletal:  No deformities, BUE and BLE strength normal and equal Skin: warm and dry  Neuro: no focal abnormalities noted Psych:  Normal affect   EKG:  The EKG was personally reviewed and demonstrates: 1/28 0602 sinus rhythm, 56 bpm with PACs Telemetry:  Telemetry was personally reviewed and demonstrates: Sinus rhythm with intermittent PACs, no high degree AV block  Relevant CV Studies:  Echo: 07/2019  IMPRESSIONS     1. Extremely limited; definity  used; essentially no parasternal or apical  views; LV function likely low normal; cannot R/O focal wall motion  abnormalities.   2. Left ventricular ejection fraction, by estimation, is 50 to 55%. The  left ventricle has low normal function. The left ventricle has no regional  wall motion abnormalities. There is Not Assessed left ventricular  hypertrophy. Left ventricular diastolic  function could not be evaluated.   3. Right ventricular systolic function is normal. The right ventricular  size is normal. Tricuspid regurgitation signal is inadequate for assessing  PA pressure.   4. The mitral valve is normal in structure. No evidence of mitral valve  regurgitation. No evidence of mitral stenosis.   5. The aortic valve was not well visualized. Aortic valve regurgitation  is not visualized.   6. The inferior vena cava is normal in size with <50% respiratory  variability, suggesting right atrial pressure of 8 mmHg.   FINDINGS   Left Ventricle: Left ventricular ejection fraction, by estimation, is 50  to 55%. The left ventricle has low normal function. The left ventricle has  no regional wall motion abnormalities. Definity  contrast agent was given  IV to delineate the left  ventricular endocardial borders. The left ventricular internal cavity size  was normal in size. There is Not Assessed left ventricular hypertrophy.  Left ventricular diastolic function could not be  evaluated.   Right Ventricle: The right ventricular size  is normal. Right vetricular  wall thickness was not assessed. Right ventricular systolic function is  normal. Tricuspid regurgitation signal is inadequate for assessing PA  pressure.   Left Atrium: Left atrial size was normal in size.   Right Atrium: Right atrial size was normal in size.   Pericardium: There is no evidence of pericardial effusion.   Mitral Valve: The mitral valve is normal in structure. Normal mobility of  the mitral valve leaflets. No evidence of mitral valve regurgitation. No  evidence of mitral valve stenosis.   Tricuspid Valve: The tricuspid valve is normal in structure. Tricuspid  valve regurgitation is trivial. No evidence of tricuspid stenosis.   Aortic Valve: The aortic valve was not well visualized. Aortic valve  regurgitation is not visualized.   Pulmonic Valve: The pulmonic valve was not well visualized. Pulmonic valve  regurgitation is not visualized.   Aorta: The aortic root is normal in size and structure.   Venous: The inferior vena cava is normal in size with less than 50%  respiratory variability, suggesting right atrial pressure of 8 mmHg.     Additional Comments: Extremely limited; definity  used; essentially no  parasternal or apical views; LV function likely low normal; cannot R/O  focal wall motion abnormalities.   Laboratory Data: High Sensitivity Troponin:  No results for input(s): TROPONINIHS in the last 720 hours.  Recent Labs  Lab 02/14/24 2226 02/15/24 0150  TRNPT 20* 19      Chemistry Recent Labs  Lab 02/14/24 2226 02/14/24 2229 02/15/24 0349 02/16/24 0730  NA 137 139 138 141  K 3.9 3.9 4.0 4.1  CL 99  --  100 104  CO2 24  --  26 28  GLUCOSE 136*  --  114* 114*  BUN 17  --  17 25*  CREATININE 1.01  --  0.98 0.94  CALCIUM  8.9  --  8.9 8.8*  MG  --   --  2.2 2.4  GFRNONAA >60  --  >60 >60  ANIONGAP 14  --  11 9    Recent Labs  Lab 02/14/24 2226 02/15/24 0349  PROT 7.8 7.4  ALBUMIN  3.8 3.8  AST 36 31  ALT  29 29  ALKPHOS 136* 130*  BILITOT 1.2 0.9   Lipids No results for input(s): CHOL, TRIG, HDL, LABVLDL, LDLCALC, CHOLHDL in the last 168 hours.  Hematology Recent Labs  Lab 02/14/24 2226 02/14/24 2229 02/15/24 0349 02/16/24 0730  WBC 6.5  --  5.8 5.6  RBC 5.33  --  5.02 5.03  HGB 14.9 15.6 14.3 14.1  HCT 47.3 46.0 44.8 43.6  MCV 88.7  --  89.2 86.7  MCH 28.0  --  28.5 28.0  MCHC 31.5  --  31.9 32.3  RDW 15.4  --  15.4 14.9  PLT 184  --  165 183   Thyroid   Recent Labs  Lab 02/16/24 0730  TSH 0.347*    BNP Recent Labs  Lab 02/14/24 2226  PROBNP 168.0    DDimer No results for input(s): DDIMER in the last 168 hours.  Radiology/Studies:  CT Angio Chest PE W and/or Wo Contrast Result Date: 02/15/2024 EXAM: CTA of the Chest without and with IV contrast for PE 02/15/2024 12:33:10 AM TECHNIQUE: CTA of the chest was performed after the administration of 75 mL of iohexol  (OMNIPAQUE ) 350 MG/ML injection. Multiplanar reformatted images are provided for review. MIP images are provided for review. Automated exposure control,  iterative reconstruction, and/or weight based adjustment of the mA/kV was utilized to reduce the radiation dose to as low as reasonably achievable. COMPARISON: 01/07/2021, chest x-ray from the previous day. CLINICAL HISTORY: Cold symptoms for several days with shortness of breath. FINDINGS: PULMONARY ARTERIES: No filling defect is noted to suggest pulmonary embolism. The pulmonary artery shows a normal branching pattern bilaterally. Main pulmonary artery is normal in caliber. MEDIASTINUM: The heart and pericardium demonstrate no acute abnormality. Tracheostomy tube is noted in satisfactory position. The right lobe of the thyroid  is prominent. The left lobe has been surgically removed. The aorta demonstrates mild atherosclerotic calcification. No aneurysmal dilatation or dissection is noted. LYMPH NODES: No mediastinal, hilar or axillary lymphadenopathy. LUNGS  AND PLEURA: Lungs are well aerated bilaterally. Patchy infiltrate is noted in the right lower lobe without associated effusion. No parenchymal nodules are noted. No pneumothorax. UPPER ABDOMEN: The visualized upper abdomen is within normal limits. SOFT TISSUES AND BONES: Degenerative changes of the thoracic spine are noted. No rib abnormality is noted. No acute soft tissue abnormality. IMPRESSION: 1. No pulmonary embolism. 2. Patchy infiltrate in the right lower lobe without associated effusion. Electronically signed by: Oneil Devonshire MD 02/15/2024 12:44 AM EST RP Workstation: [id number]   DG Chest Portable 1 View Result Date: 02/14/2024 EXAM: 1 VIEW XRAY OF THE CHEST 02/14/2024 10:53:00 PM COMPARISON: Chest x-ray 09/07/2022, CT chest 01/07/2021. CLINICAL HISTORY: Shortness of breath, chest pain. FINDINGS: LINES, TUBES AND DEVICES: Tracheostomy in place. LUNGS AND PLEURA: Question: Left basilar airspace opacity. No pleural effusion. No pneumothorax. HEART AND MEDIASTINUM: Following cardiac silhouette likely due to AP orbital technique. Otherwise unremarkable mediastinal silhouette. BONES AND SOFT TISSUES: No acute osseous abnormality. IMPRESSION: 1. Question left basilar airspace opacity. Recommend further evaluation with chest x-ray PA and lateral views. 2. Tracheostomy tube in place. Electronically signed by: Morgane Naveau MD 02/14/2024 10:57 PM EST RP Workstation: [id number]     Assessment and Plan:  Timothy Le is a 64 y.o. male with a hx of hypoxic respiratory failure status post tracheostomy, COPD, diabetes, hypertension, severe obesity, DVT on chronic anticoagulation who is being seen 02/16/2024 for the evaluation of bradycardia at the request of Dr Franky.  Bradycardia -- In review of telemetry did have brief episode this morning around 6 AM with sinus bradycardia with frequent PACs.  Given his chronic respiratory disease and trach dependence no concerning high degree AV block noted. --  Metoprolol  to tartrate 12.5 mg twice daily initially held, will resume this afternoon given PACs noted  Per Primary Acute on chronic respiratory failure with hypoxia secondary to RSV pneumonia Trach dependent COPD exacerbation Hypertension Diabetes   For questions or updates, please contact [facility] Please consult [email or web address] for contact info under      Signed, Manuelita Rummer, NP  02/16/2024 2:12 PM     [1] No Known Allergies  "

## 2024-02-17 ENCOUNTER — Telehealth: Admitting: Licensed Clinical Social Worker

## 2024-02-17 DIAGNOSIS — J9621 Acute and chronic respiratory failure with hypoxia: Secondary | ICD-10-CM | POA: Diagnosis not present

## 2024-02-17 LAB — GLUCOSE, CAPILLARY
Glucose-Capillary: 107 mg/dL — ABNORMAL HIGH (ref 70–99)
Glucose-Capillary: 192 mg/dL — ABNORMAL HIGH (ref 70–99)
Glucose-Capillary: 140 mg/dL — ABNORMAL HIGH (ref 70–99)
Glucose-Capillary: 197 mg/dL — ABNORMAL HIGH (ref 70–99)

## 2024-02-17 LAB — CBC
HCT: 46.4 % (ref 39.0–52.0)
Hemoglobin: 14.8 g/dL (ref 13.0–17.0)
MCH: 28 pg (ref 26.0–34.0)
MCHC: 31.9 g/dL (ref 30.0–36.0)
MCV: 87.7 fL (ref 80.0–100.0)
Platelets: 199 10*3/uL (ref 150–400)
RBC: 5.29 MIL/uL (ref 4.22–5.81)
RDW: 15.1 % (ref 11.5–15.5)
WBC: 7.4 10*3/uL (ref 4.0–10.5)
nRBC: 0 % (ref 0.0–0.2)

## 2024-02-17 LAB — BASIC METABOLIC PANEL WITH GFR
Anion gap: 12 (ref 5–15)
BUN: 29 mg/dL — ABNORMAL HIGH (ref 8–23)
CO2: 27 mmol/L (ref 22–32)
Calcium: 8.9 mg/dL (ref 8.9–10.3)
Chloride: 102 mmol/L (ref 98–111)
Creatinine, Ser: 1.11 mg/dL (ref 0.61–1.24)
GFR, Estimated: >60 mL/min
Glucose, Bld: 134 mg/dL — ABNORMAL HIGH (ref 70–99)
Potassium: 3.6 mmol/L (ref 3.5–5.1)
Sodium: 141 mmol/L (ref 135–145)

## 2024-02-17 MED ORDER — CEFUROXIME AXETIL 250 MG PO TABS
250.0000 mg | ORAL_TABLET | Freq: Two times a day (BID) | ORAL | Status: DC
  Administered 2024-02-17 – 2024-02-18 (×2): 250 mg via ORAL
  Filled 2024-02-17 (×3): qty 1

## 2024-02-17 MED ORDER — INSULIN GLARGINE-YFGN 100 UNIT/ML ~~LOC~~ SOLN
8.0000 [IU] | Freq: Every day | SUBCUTANEOUS | Status: DC
  Administered 2024-02-18: 8 [IU] via SUBCUTANEOUS
  Filled 2024-02-17: qty 0.08

## 2024-02-17 NOTE — Plan of Care (Signed)
" °  Problem: Metabolic: Goal: Ability to maintain appropriate glucose levels will improve Outcome: Progressing   Problem: Nutritional: Goal: Maintenance of adequate nutrition will improve Outcome: Progressing   Problem: Clinical Measurements: Goal: Respiratory complications will improve Outcome: Progressing Goal: Cardiovascular complication will be avoided Outcome: Progressing   Problem: Activity: Goal: Risk for activity intolerance will decrease Outcome: Progressing   Problem: Nutrition: Goal: Adequate nutrition will be maintained Outcome: Progressing   Problem: Safety: Goal: Ability to remain free from injury will improve Outcome: Progressing   "

## 2024-02-17 NOTE — Progress Notes (Addendum)
 " PROGRESS NOTE    Timothy Le  FMW:[id number] DOB: [birth year] DOA: 02/14/2024 PCP: Juanice Thomes SAUNDERS, FNP   63/M with chronic hypoxic and hypercarbic respiratory failure, COPD, OSA OHS, tracheostomy for few years, diastolic CHF,, DM2, DVT on chronic anticoagulation, gout, OSA, OHS, and morbid obesity presented to the ER with shortness of breath and productive cough.,  Also reported congestion and headaches and wheezing.  In the ED he was febrile, hypoxic placed on 7 followed by 10 L O2, troponin 20, BNP normal, chest x-ray with left basilar opacity, RSV positive, CT angiogram of the chest noted no pulmonary embolism and a patchy infiltrate in the right lower lung without associated effusion.   Subjective: - Feels better overall, breathing continues to improve, still with some cough  Assessment and Plan:  Acute on chronic respiratory failure with hypoxia secondary to RSV pneumonia Tracheostomy dependent - CTA chest noted patchy right lower lobe infiltrate, borderline elevated procalcitonin -Started on empiric Rocephin , azithromycin > switch to po -sp IV steroids, now switched to prednisone  -Continue Mucinex , Pulmicort , Brovana  - Increase activity, pulmonary toilet, - Continue to wean O2, discharge planning, anticipate home tomorrow   COPD exacerbation -As above   Chronic diastolic CHF -last echocardiogram noted EF to be 50 to 55% with indeterminate diastolic parameters when last checked back in 2021.   - Continue higher dose of torsemide , he is 3 L negative   Essential hypertension Blood pressures currently maintained. - Continue metoprolol  and torsemide    Controlled diabetes mellitus type 2 with long-term use of insulin  - Last hemoglobin A1c noted to be 6.8  on 01/24/2024. - Hypoglycemic protocols - Hold metformin  - Continue Jardiance  and Semglee , CBGs are stable, drop Semglee  dose   History of DVT on chronic anticoagulation Has a history of left lower extremity DVT on  chronic anticoagulation. - Continue Eliquis    Dyslipidemia - Continue atorvastatin    Anxiety   - Continue Zoloft    Gout - Continue allopurinol    Morbid obesity BMI 50.94 kg/m  DVT prophylaxis: Eliquis  Advance Care Planning:   Code Status: Full Code     Consults: None   Family Communication: None Disposition Plan: Home likely tomorrow  Consultants:    Procedures:   Antimicrobials:    Objective: Vitals:   02/17/24 0404 02/17/24 0731 02/17/24 0741 02/17/24 1046  BP: (!) 148/93  (!) 145/78   Pulse: 84 (!) 51    Resp: 18 18 18 18   Temp: 97.9 F (36.6 C)  97.8 F (36.6 C)   TempSrc: Oral  Oral   SpO2: 97%     Weight: (!) 148.3 kg     Height:        Intake/Output Summary (Last 24 hours) at 02/17/2024 1133 Last data filed at 02/17/2024 0615 Gross per 24 hour  Intake 678.01 ml  Output 3315 ml  Net -2636.99 ml   Filed Weights   02/14/24 2227 02/16/24 0443 02/17/24 0404  Weight: (!) 152 kg (!) 148.4 kg (!) 148.3 kg    Examination:  General exam: Appears calm and comfortable obese, chronically ill, AAO x 3 no distress HEENT: Trach collar Respiratory system: Few scattered rhonchi, improving air movement Cardiovascular system: S1 & S2 heard, RRR.  Abd: nondistended, soft and nontender.Normal bowel sounds heard. Central nervous system: Alert and oriented. No focal neurological deficits. Extremities: no edema Skin: No rashes Psychiatry:  Mood & affect appropriate.     Data Reviewed:   CBC: Recent Labs  Lab 02/14/24 2226 02/14/24 2229 02/15/24 0349 02/16/24 0730  02/17/24 0403  WBC 6.5  --  5.8 5.6 7.4  NEUTROABS  --   --  3.0 3.1  --   HGB 14.9 15.6 14.3 14.1 14.8  HCT 47.3 46.0 44.8 43.6 46.4  MCV 88.7  --  89.2 86.7 87.7  PLT 184  --  165 183 199   Basic Metabolic Panel: Recent Labs  Lab 02/14/24 2226 02/14/24 2229 02/15/24 0349 02/16/24 0730 02/17/24 0403  NA 137 139 138 141 141  K 3.9 3.9 4.0 4.1 3.6  CL 99  --  100 104 102  CO2 24   --  26 28 27   GLUCOSE 136*  --  114* 114* 134*  BUN 17  --  17 25* 29*  CREATININE 1.01  --  0.98 0.94 1.11  CALCIUM  8.9  --  8.9 8.8* 8.9  MG  --   --  2.2 2.4  --    GFR: Estimated Creatinine Clearance: 98 mL/min (by C-G formula based on SCr of 1.11 mg/dL). Liver Function Tests: Recent Labs  Lab 02/14/24 2226 02/15/24 0349  AST 36 31  ALT 29 29  ALKPHOS 136* 130*  BILITOT 1.2 0.9  PROT 7.8 7.4  ALBUMIN  3.8 3.8   No results for input(s): LIPASE, AMYLASE in the last 168 hours. No results for input(s): AMMONIA in the last 168 hours. Coagulation Profile: No results for input(s): INR, PROTIME in the last 168 hours. Cardiac Enzymes: No results for input(s): CKTOTAL, CKMB, CKMBINDEX, TROPONINI in the last 168 hours. BNP (last 3 results) Recent Labs    02/14/24 2226  PROBNP 168.0   HbA1C: No results for input(s): HGBA1C in the last 72 hours. CBG: Recent Labs  Lab 02/16/24 1236 02/16/24 1615 02/16/24 2058 02/17/24 0740 02/17/24 1115  GLUCAP 137* 126* 142* 107* 192*   Lipid Profile: No results for input(s): CHOL, HDL, LDLCALC, TRIG, CHOLHDL, LDLDIRECT in the last 72 hours. Thyroid  Function Tests: Recent Labs    02/16/24 0730  TSH 0.347*   Anemia Panel: No results for input(s): VITAMINB12, FOLATE, FERRITIN, TIBC, IRON, RETICCTPCT in the last 72 hours. Urine analysis:    Component Value Date/Time   COLORURINE YELLOW 07/30/2019 1743   APPEARANCEUR HAZY (A) 07/30/2019 1743   LABSPEC 1.026 07/30/2019 1743   PHURINE 5.0 07/30/2019 1743   GLUCOSEU >=500 (A) 07/30/2019 1743   HGBUR LARGE (A) 07/30/2019 1743   BILIRUBINUR neg 04/11/2020 1137   KETONESUR negative 01/11/2020 0929   KETONESUR NEGATIVE 07/30/2019 1743   PROTEINUR Negative 04/11/2020 1137   PROTEINUR 100 (A) 07/30/2019 1743   UROBILINOGEN 1.0 04/11/2020 1137   NITRITE neg 04/11/2020 1137   NITRITE NEGATIVE 07/30/2019 1743   LEUKOCYTESUR Negative 04/11/2020  1137   LEUKOCYTESUR NEGATIVE 07/30/2019 1743   Sepsis Labs: @LABRCNTIP (procalcitonin:4,lacticidven:4)  ) Recent Results (from the past 240 hours)  Resp panel by RT-PCR (RSV, Flu A&B, Covid) Anterior Nasal Swab     Status: Abnormal   Collection Time: 02/14/24 10:25 PM   Specimen: Anterior Nasal Swab  Result Value Ref Range Status   SARS Coronavirus 2 by RT PCR NEGATIVE NEGATIVE Final   Influenza A by PCR NEGATIVE NEGATIVE Final   Influenza B by PCR NEGATIVE NEGATIVE Final    Comment: (NOTE) The Xpert Xpress SARS-CoV-2/FLU/RSV plus assay is intended as an aid in the diagnosis of influenza from Nasopharyngeal swab specimens and should not be used as a sole basis for treatment. Nasal washings and aspirates are unacceptable for Xpert Xpress SARS-CoV-2/FLU/RSV testing.  Fact Sheet for  Patients: bloggercourse.com  Fact Sheet for Healthcare Providers: seriousbroker.it  This test is not yet approved or cleared by the [place]  FDA and has been authorized for detection and/or diagnosis of SARS-CoV-2 by FDA under an Emergency Use Authorization (EUA). This EUA will remain in effect (meaning this test can be used) for the duration of the COVID-19 declaration under Section 564(b)(1) of the Act, 21 U.S.C. section 360bbb-3(b)(1), unless the authorization is terminated or revoked.     Resp Syncytial Virus by PCR POSITIVE (A) NEGATIVE Final    Comment: (NOTE) Fact Sheet for Patients: bloggercourse.com  Fact Sheet for Healthcare Providers: seriousbroker.it  This test is not yet approved or cleared by the [place]  FDA and has been authorized for detection and/or diagnosis of SARS-CoV-2 by FDA under an Emergency Use Authorization (EUA). This EUA will remain in effect (meaning this test can be used) for the duration of the COVID-19 declaration under Section 564(b)(1) of the Act, 21  U.S.C. section 360bbb-3(b)(1), unless the authorization is terminated or revoked.  Performed at [facility], [address] [place], [place] [address]      Radiology Studies: No results found.    Scheduled Meds:  allopurinol   100 mg Oral Daily   apixaban   5 mg Oral BID   arformoterol   15 mcg Nebulization BID   atorvastatin   80 mg Oral Daily   azithromycin   500 mg Oral Daily   budesonide  (PULMICORT ) nebulizer solution  0.5 mg Nebulization BID   empagliflozin   25 mg Oral QAC breakfast   gabapentin   100 mg Oral Daily   guaiFENesin   600 mg Oral BID   insulin  aspart  0-5 Units Subcutaneous QHS   insulin  aspart  0-9 Units Subcutaneous TID WC   insulin  glargine-yfgn  15 Units Subcutaneous Daily   metoprolol  tartrate  12.5 mg Oral BID   predniSONE   40 mg Oral Q breakfast   revefenacin   175 mcg Nebulization Daily   sertraline   100 mg Oral Daily   torsemide   20 mg Oral Daily   Continuous Infusions:  cefTRIAXone  (ROCEPHIN )  IV 200 mL/hr at 02/16/24 1856     LOS: 2 days    Time spent:    Sigurd Pac, MD [facility]   02/17/2024, 11:33 AM    "

## 2024-02-17 NOTE — TOC Initial Note (Signed)
 Transition of Care [facility]) - Initial/Assessment Note    Patient Details  Name: Timothy Le MRN: [id number] Date of Birth: 06-06-1960  [facility] CM/SW Contact:    Sudie Erminio Deems, RN Phone Number: 02/17/2024, 2:13 PM  Clinical Narrative: Patient presented for respiratory distress-chronic trach. PTA patient states he was from home with a roommate. Patient states he gets trach supplies from the hospital only. ICM did speak with [facility] NP Jeralyn and he has placed orders for trach supplies. Patient is agreeable to have supplies via [employer]. Referral submitted and DME will be delivered to the home. Staff RN to reinforce trach care education before the patient returns home. Patient states he does not uses any oxygen in the home. Patient has DME cane at the bedside. Patient states he will have to call his roommate for transportation home. ICM will continue to follow for additional needs.               Expected Discharge Plan: Home/Self Care Barriers to Discharge: No Barriers Identified   Patient Goals and CMS Choice Patient states their goals for this hospitalization and ongoing recovery are:: Plan will to discharge home          Expected Discharge Plan and Services In-house Referral: NA Discharge Planning Services: CM Consult Post Acute Care Choice: Durable Medical Equipment Living arrangements for the past 2 months: Apartment                 DME Arranged: Trach supplies DME Agency: [employer] Date DME Agency Contacted: 02/17/24 Time DME Agency Contacted: [id number] Representative spoke with at DME Agency: Mitch HH Arranged: NA          Prior Living Arrangements/Services Living arrangements for the past 2 months: Apartment Lives with:: Self Patient language and need for interpreter reviewed:: Yes Do you feel safe going back to the place where you live?: Yes      Need for Family Participation in Patient Care: No (Comment) Care giver support  system in place?: No (comment)   Criminal Activity/Legal Involvement Pertinent to Current Situation/Hospitalization: No - Comment as needed  Activities of Daily Living   ADL Screening (condition at time of admission) Independently performs ADLs?: Yes (appropriate for developmental age) Is the patient deaf or have difficulty hearing?: No Does the patient have difficulty seeing, even when wearing glasses/contacts?: No Does the patient have difficulty concentrating, remembering, or making decisions?: No  Permission Sought/Granted Permission sought to share information with : Case Manager, Family Supports, Oceanographer granted to share information with : Yes, Verbal Permission Granted     Permission granted to share info w AGENCY: [employer]        Emotional Assessment Appearance:: Appears stated age Attitude/Demeanor/Rapport: Engaged Affect (typically observed): Appropriate Orientation: : Oriented to Self, Oriented to Place, Oriented to Situation, Oriented to  Time Alcohol / Substance Use: Not Applicable Psych Involvement: No (comment)  Admission diagnosis:  Depression with anxiety [F41.8] Hypoxia [R09.02] Dyslipidemia, goal LDL below 70 [E78.5] Chronic diastolic CHF (congestive heart failure) (HCC) [I50.32] Type 2 diabetes mellitus with diabetic polyneuropathy, with long-term current use of insulin  (HCC) [E11.42, Z79.4] Gout of both feet [M10.9] Community acquired pneumonia of right lower lobe of lung [J18.9] Acute on chronic hypoxic respiratory failure (HCC) [J96.21] Infection due to respiratory syncytial virus (RSV), unspecified infection type [B33.8] Patient Active Problem List   Diagnosis Date Noted   Bradycardia 02/16/2024   Acute on chronic hypoxic respiratory failure (HCC) 02/15/2024  RSV (respiratory syncytial virus pneumonia) 02/15/2024   Controlled type 2 diabetes mellitus, with long-term current use of insulin  (HCC) 02/15/2024   History of  DVT (deep vein thrombosis) 02/15/2024   History of gout 02/15/2024   Health care maintenance 01/24/2024   Mood disorder 01/24/2024   Dermatitis 01/24/2024   Impacted cerumen of right ear 09/24/2023   Annual physical exam 09/24/2023   Food insecurity 09/24/2023   Impaired vision 09/24/2023   Gout of both feet 06/23/2023   Chronic pain of left knee 09/18/2022   Encounter for examination following treatment at hospital 09/18/2022   Need for influenza vaccination 04/03/2022   History of lobectomy of thyroid  05/08/2021   Morbid obesity (HCC) 05/08/2021   Acute deep vein thrombosis (DVT) of left lower extremity (HCC) 12/04/2019   Anxiety 12/04/2019   Pain due to onychomycosis of toenails of both feet 09/27/2019   Tracheobronchitis 09/26/2019   UTI (urinary tract infection) 09/26/2019   Debility 09/15/2019   Chronic diastolic CHF (congestive heart failure) (HCC) 09/07/2019   Poorly controlled diabetes mellitus (HCC) 09/06/2019   Acute airway obstruction    Endotracheal tube present    Acute on chronic respiratory failure (HCC) 07/30/2019   Acute respiratory failure (HCC) 07/30/2019   Chronic respiratory failure due to obstructive sleep apnea (HCC)    Nodule of soft tissue 01/24/2019   Benign essential HTN    Dyslipidemia, goal LDL below 70    Tracheostomy dependence (HCC)    Physical deconditioning 07/15/2017   Chronic respiratory failure with hypoxia (HCC) 07/09/2017   Acute pulmonary edema (HCC)    Decubitus ulcer 06/19/2017   Difficult airway for intubation    Hypoventilation associated with obesity syndrome (HCC) 06/09/2017   COPD with acute exacerbation (HCC) 06/17/2016   Seasonal and perennial allergic rhinitis 05/24/2016   AKI (acute kidney injury) 04/08/2015   Hyperosmolar hyperglycemic state (HHS) (HCC) 03/28/2015   Acute on chronic respiratory failure with hypoxemia (HCC) 03/27/2015   COPD (chronic obstructive pulmonary disease) (HCC) 12/27/2014   Severe obesity (BMI >=  40) (HCC) 12/27/2014   Type 2 diabetes mellitus with diabetic polyneuropathy, with long-term current use of insulin  (HCC) 12/27/2014   OSA (obstructive sleep apnea) 07/29/2013   Peripheral edema 07/29/2013   PCP:  [name], [name], FNP Pharmacy:   [facility] - [facility] [address] [place] [address] Phone: [phone] Fax: [phone]  [id number] - [place], [place] - [address] [facility] [address] [place] [address] Phone: [phone] Fax: [phone]     Social Drivers of Health (SDOH) Social History: SDOH Screenings   Food Insecurity: Food Insecurity Present (02/15/2024)  Housing: Low Risk (02/15/2024)  Transportation Needs: No Transportation Needs (02/15/2024)  Utilities: Not At Risk (02/15/2024)  Depression (PHQ2-9): Low Risk (01/24/2024)  Tobacco Use: Medium Risk (02/14/2024)   SDOH Interventions: Food Insecurity Interventions: [facility] Provided, Inpatient TOC   Readmission Risk Interventions     No data to display

## 2024-02-18 ENCOUNTER — Other Ambulatory Visit (HOSPITAL_COMMUNITY): Payer: Self-pay

## 2024-02-18 LAB — BASIC METABOLIC PANEL WITH GFR
Anion gap: 12 (ref 5–15)
BUN: 32 mg/dL — ABNORMAL HIGH (ref 8–23)
CO2: 26 mmol/L (ref 22–32)
Calcium: 8.8 mg/dL — ABNORMAL LOW (ref 8.9–10.3)
Chloride: 103 mmol/L (ref 98–111)
Creatinine, Ser: 1.2 mg/dL (ref 0.61–1.24)
GFR, Estimated: >60 mL/min
Glucose, Bld: 120 mg/dL — ABNORMAL HIGH (ref 70–99)
Potassium: 3.6 mmol/L (ref 3.5–5.1)
Sodium: 141 mmol/L (ref 135–145)

## 2024-02-18 LAB — GLUCOSE, CAPILLARY
Glucose-Capillary: 104 mg/dL — ABNORMAL HIGH (ref 70–99)
Glucose-Capillary: 168 mg/dL — ABNORMAL HIGH (ref 70–99)

## 2024-02-18 MED ORDER — PREDNISONE 20 MG PO TABS
30.0000 mg | ORAL_TABLET | Freq: Every day | ORAL | Status: DC
  Administered 2024-02-18: 30 mg via ORAL
  Filled 2024-02-18: qty 1

## 2024-02-18 MED ORDER — CEFUROXIME AXETIL 250 MG PO TABS
250.0000 mg | ORAL_TABLET | Freq: Two times a day (BID) | ORAL | 0 refills | Status: AC
  Filled 2024-02-18: qty 4, 2d supply, fill #0

## 2024-02-18 MED ORDER — PREDNISONE 10 MG PO TABS
10.0000 mg | ORAL_TABLET | Freq: Every day | ORAL | 0 refills | Status: AC
  Filled 2024-02-18: qty 12, 6d supply, fill #0

## 2024-02-18 MED ORDER — TORSEMIDE 10 MG PO TABS
20.0000 mg | ORAL_TABLET | Freq: Every day | ORAL | 0 refills | Status: AC
  Filled 2024-02-18: qty 90, 45d supply, fill #0

## 2024-02-18 MED ORDER — LANTUS SOLOSTAR 100 UNIT/ML ~~LOC~~ SOPN: PEN_INJECTOR | SUBCUTANEOUS | Status: AC

## 2024-02-18 NOTE — Plan of Care (Signed)

## 2024-02-18 NOTE — Progress Notes (Deleted)
 " PROGRESS NOTE    Timothy Le  FMW:[id number] DOB: 17-Jul-1960 DOA: 02/14/2024 PCP: Juanice Thomes SAUNDERS, FNP   63/M with chronic hypoxic and hypercarbic respiratory failure, COPD, OSA OHS, tracheostomy for few years, diastolic CHF,, DM2, DVT on chronic anticoagulation, gout, OSA, OHS, and morbid obesity presented to the ER with shortness of breath and productive cough.,  Also reported congestion and headaches and wheezing.  In the ED he was febrile, hypoxic placed on 7 followed by 10 L O2, troponin 20, BNP normal, chest x-ray with left basilar opacity, RSV positive, CT angiogram of the chest noted no pulmonary embolism and a patchy infiltrate in the right lower lung without associated effusion. -   Subjective: - Feels better overall, breathing improving, oxygen dropped considerably yesterday, placed on 6 L overnight  Assessment and Plan:  Acute on chronic respiratory failure with hypoxia secondary to RSV pneumonia Tracheostomy dependent - CTA chest noted patchy right lower lobe infiltrate, borderline elevated procalcitonin -Started on empiric Rocephin , azithromycin > switch to po - Was on IV steroids as well, switch to prednisone  taper -Continue Mucinex , Pulmicort , Brovana  - Increase activity, pulmonary toilet, - Continue attempt to wean O2, unfortunately worsening hypoxia overnight, volume status is difficult to assess, repeat x-ray   COPD exacerbation -As above   Chronic diastolic CHF -last echocardiogram noted EF to be 50 to 55% with indeterminate diastolic parameters when last checked back in 2021.   - Continue higher dose of torsemide , he is 3 L negative   Essential hypertension Blood pressures currently maintained. - Continue metoprolol  and torsemide    Controlled diabetes mellitus type 2 with long-term use of insulin  - Last hemoglobin A1c noted to be 6.8  on 01/24/2024. - Hypoglycemic protocols - Hold metformin  - Continue Jardiance  and Semglee , CBGs are stable, drop  Semglee  dose   History of DVT on chronic anticoagulation Has a history of left lower extremity DVT on chronic anticoagulation. - Continue Eliquis    Dyslipidemia - Continue atorvastatin    Anxiety   - Continue Zoloft    Gout - Continue allopurinol    Morbid obesity BMI 50.94 kg/m  DVT prophylaxis: Eliquis  Advance Care Planning:   Code Status: Full Code     Consults: None   Family Communication: None Disposition Plan: Home likely tomorrow  Consultants:    Procedures:   Antimicrobials:    Objective: Vitals:   02/18/24 0859 02/18/24 0900 02/18/24 1015 02/18/24 1152  BP:    (!) 155/80  Pulse: (!) 109 71  80  Resp:   18 17  Temp:    98.2 F (36.8 C)  TempSrc:    Oral  SpO2: 96% 93%  92%  Weight:      Height:        Intake/Output Summary (Last 24 hours) at 02/18/2024 1231 Last data filed at 02/18/2024 1155 Gross per 24 hour  Intake 540 ml  Output 3500 ml  Net -2960 ml   Filed Weights   02/16/24 0443 02/17/24 0404 02/18/24 0430  Weight: (!) 148.4 kg (!) 148.3 kg (!) 146.2 kg    Examination:  General exam: Open this, pleasant laying in bed, AAO x 3, no distress HEENT: Trach collar on, on 6 L O2 Respiratory system: Improved air movement, few scattered rhonchi Cardiovascular system: S1 & S2 heard, RRR.  Abd: nondistended, soft and nontender.Normal bowel sounds heard. Central nervous system: Alert and oriented. No focal neurological deficits. Extremities: no edema Skin: No rashes Psychiatry:  Mood & affect appropriate.     Data Reviewed:  CBC: Recent Labs  Lab 02/14/24 2226 02/14/24 2229 02/15/24 0349 02/16/24 0730 02/17/24 0403  WBC 6.5  --  5.8 5.6 7.4  NEUTROABS  --   --  3.0 3.1  --   HGB 14.9 15.6 14.3 14.1 14.8  HCT 47.3 46.0 44.8 43.6 46.4  MCV 88.7  --  89.2 86.7 87.7  PLT 184  --  165 183 199   Basic Metabolic Panel: Recent Labs  Lab 02/14/24 2226 02/14/24 2229 02/15/24 0349 02/16/24 0730 02/17/24 0403 02/18/24 0417  NA 137  139 138 141 141 141  K 3.9 3.9 4.0 4.1 3.6 3.6  CL 99  --  100 104 102 103  CO2 24  --  26 28 27 26   GLUCOSE 136*  --  114* 114* 134* 120*  BUN 17  --  17 25* 29* 32*  CREATININE 1.01  --  0.98 0.94 1.11 1.20  CALCIUM  8.9  --  8.9 8.8* 8.9 8.8*  MG  --   --  2.2 2.4  --   --    GFR: Estimated Creatinine Clearance: 89.8 mL/min (by C-G formula based on SCr of 1.2 mg/dL). Liver Function Tests: Recent Labs  Lab 02/14/24 2226 02/15/24 0349  AST 36 31  ALT 29 29  ALKPHOS 136* 130*  BILITOT 1.2 0.9  PROT 7.8 7.4  ALBUMIN  3.8 3.8   No results for input(s): LIPASE, AMYLASE in the last 168 hours. No results for input(s): AMMONIA in the last 168 hours. Coagulation Profile: No results for input(s): INR, PROTIME in the last 168 hours. Cardiac Enzymes: No results for input(s): CKTOTAL, CKMB, CKMBINDEX, TROPONINI in the last 168 hours. BNP (last 3 results) Recent Labs    02/14/24 2226  PROBNP 168.0   HbA1C: No results for input(s): HGBA1C in the last 72 hours. CBG: Recent Labs  Lab 02/17/24 1115 02/17/24 1609 02/17/24 2107 02/18/24 0741 02/18/24 1152  GLUCAP 192* 140* 197* 104* 168*   Lipid Profile: No results for input(s): CHOL, HDL, LDLCALC, TRIG, CHOLHDL, LDLDIRECT in the last 72 hours. Thyroid  Function Tests: Recent Labs    02/16/24 0730  TSH 0.347*   Anemia Panel: No results for input(s): VITAMINB12, FOLATE, FERRITIN, TIBC, IRON, RETICCTPCT in the last 72 hours. Urine analysis:    Component Value Date/Time   COLORURINE YELLOW 07/30/2019 1743   APPEARANCEUR HAZY (A) 07/30/2019 1743   LABSPEC 1.026 07/30/2019 1743   PHURINE 5.0 07/30/2019 1743   GLUCOSEU >=500 (A) 07/30/2019 1743   HGBUR LARGE (A) 07/30/2019 1743   BILIRUBINUR neg 04/11/2020 1137   KETONESUR negative 01/11/2020 0929   KETONESUR NEGATIVE 07/30/2019 1743   PROTEINUR Negative 04/11/2020 1137   PROTEINUR 100 (A) 07/30/2019 1743   UROBILINOGEN 1.0  04/11/2020 1137   NITRITE neg 04/11/2020 1137   NITRITE NEGATIVE 07/30/2019 1743   LEUKOCYTESUR Negative 04/11/2020 1137   LEUKOCYTESUR NEGATIVE 07/30/2019 1743   Sepsis Labs: @LABRCNTIP (procalcitonin:4,lacticidven:4)  ) Recent Results (from the past 240 hours)  Resp panel by RT-PCR (RSV, Flu A&B, Covid) Anterior Nasal Swab     Status: Abnormal   Collection Time: 02/14/24 10:25 PM   Specimen: Anterior Nasal Swab  Result Value Ref Range Status   SARS Coronavirus 2 by RT PCR NEGATIVE NEGATIVE Final   Influenza A by PCR NEGATIVE NEGATIVE Final   Influenza B by PCR NEGATIVE NEGATIVE Final    Comment: (NOTE) The Xpert Xpress SARS-CoV-2/FLU/RSV plus assay is intended as an aid in the diagnosis of influenza from Nasopharyngeal swab specimens  and should not be used as a sole basis for treatment. Nasal washings and aspirates are unacceptable for Xpert Xpress SARS-CoV-2/FLU/RSV testing.  Fact Sheet for Patients: bloggercourse.com  Fact Sheet for Healthcare Providers: seriousbroker.it  This test is not yet approved or cleared by the [place]  FDA and has been authorized for detection and/or diagnosis of SARS-CoV-2 by FDA under an Emergency Use Authorization (EUA). This EUA will remain in effect (meaning this test can be used) for the duration of the COVID-19 declaration under Section 564(b)(1) of the Act, 21 U.S.C. section 360bbb-3(b)(1), unless the authorization is terminated or revoked.     Resp Syncytial Virus by PCR POSITIVE (A) NEGATIVE Final    Comment: (NOTE) Fact Sheet for Patients: bloggercourse.com  Fact Sheet for Healthcare Providers: seriousbroker.it  This test is not yet approved or cleared by the [place]  FDA and has been authorized for detection and/or diagnosis of SARS-CoV-2 by FDA under an Emergency Use Authorization (EUA). This EUA will remain in effect  (meaning this test can be used) for the duration of the COVID-19 declaration under Section 564(b)(1) of the Act, 21 U.S.C. section 360bbb-3(b)(1), unless the authorization is terminated or revoked.  Performed at [facility], [address], [place], [place] [address]      Radiology Studies: No results found.    Scheduled Meds:  allopurinol   100 mg Oral Daily   apixaban   5 mg Oral BID   arformoterol   15 mcg Nebulization BID   atorvastatin   80 mg Oral Daily   azithromycin   500 mg Oral Daily   budesonide  (PULMICORT ) nebulizer solution  0.5 mg Nebulization BID   cefUROXime   250 mg Oral BID WC   empagliflozin   25 mg Oral QAC breakfast   gabapentin   100 mg Oral Daily   guaiFENesin   600 mg Oral BID   insulin  aspart  0-5 Units Subcutaneous QHS   insulin  aspart  0-9 Units Subcutaneous TID WC   insulin  glargine-yfgn  8 Units Subcutaneous Daily   predniSONE   30 mg Oral Q breakfast   revefenacin   175 mcg Nebulization Daily   sertraline   100 mg Oral Daily   torsemide   20 mg Oral Daily   Continuous Infusions:     LOS: 3 days    Time spent:    Sigurd Pac, MD [facility]   02/18/2024, 12:31 PM    "

## 2024-02-18 NOTE — TOC Transition Note (Signed)
 Transition of Care [facility]) - Discharge Note   Patient Details  Name: Timothy Le MRN: [id number] Date of Birth: July 23, 1960  [facility] of [facility]) CM/SW Contact:  Sudie Erminio Deems, RN Phone Number: 02/18/2024, 1:52 PM   Clinical Narrative: Patient has trach supplies in the room. [employer] is following for additional supplies for home.  Staff RN was aware to educate patient regarding trach care before discharge. Patient states he will need a cab for transport home. Patient will go to the dc lounge and they will assist with cab. No further needs identified at this time.    Barriers to Discharge: No Barriers Identified   Patient Goals and CMS Choice Patient states their goals for this hospitalization and ongoing recovery are:: Plan will to discharge home   Discharge Plan and Services Additional resources added to the After Visit Summary for   In-house Referral: NA Discharge Planning Services: CM Consult Post Acute Care Choice: Durable Medical Equipment          DME Arranged: Trach supplies DME Agency: [employer] Date DME Agency Contacted: 02/17/24 Time DME Agency Contacted: [phone] Representative spoke with at DME Agency: [facility] Arranged: NA          Social Drivers of Health (SDOH) Interventions SDOH Screenings   Food Insecurity: Food Insecurity Present (02/15/2024)  Housing: Low Risk (02/15/2024)  Transportation Needs: No Transportation Needs (02/15/2024)  Utilities: Not At Risk (02/15/2024)  Depression (PHQ2-9): Low Risk (01/24/2024)  Tobacco Use: Medium Risk (02/14/2024)     Readmission Risk Interventions     No data to display

## 2024-02-18 NOTE — Discharge Summary (Incomplete)
 Physician Discharge Summary  Timothy Le FMW:[id number] DOB: July 30, 1960 DOA: 02/14/2024  PCP: [name], [name], FNP  Admit date: 02/14/2024 Discharge date: 02/18/2024  Time spent: 45 minutes  Recommendations for Outpatient Follow-up:  Trach clinic in 1 month Ambulatory referral sent to [facility] heart care PCP in 1 week   Discharge Diagnoses:  Principal Problem:   Acute on chronic hypoxic respiratory failure (HCC) Active Problems:   Tracheostomy dependence (HCC)   RSV (respiratory syncytial virus pneumonia)   COPD with acute exacerbation (HCC)   Chronic diastolic CHF (congestive heart failure) (HCC)   Benign essential HTN   Controlled type 2 diabetes mellitus, with long-term current use of insulin  (HCC)   History of DVT (deep vein thrombosis)   Dyslipidemia, goal LDL below 70   Anxiety   History of gout   Morbid obesity (HCC)   Bradycardia   Discharge Condition: ***  Diet recommendation: ***  Filed Weights   02/16/24 0443 02/17/24 0404 02/18/24 0430  Weight: (!) 148.4 kg (!) 148.3 kg (!) 146.2 kg    History of present illness:  ***  Hospital Course:  ***  Procedures: *** (i.e. Studies not automatically included, echos, thoracentesis, etc; not x-rays)  Consultations: ***  Discharge Exam: Vitals:   02/18/24 1015 02/18/24 1152  BP:  (!) 155/80  Pulse:  80  Resp: 18 17  Temp:  98.2 F (36.8 C)  SpO2:  92%    General: *** Cardiovascular: *** Respiratory: ***  Discharge Instructions   Discharge Instructions     Ambulatory referral to Cardiology   Complete by: As directed    Diastolic CHF   Increase activity slowly   Complete by: As directed       Allergies as of 02/18/2024   No Known Allergies      Medication List     STOP taking these medications    metoprolol  tartrate 25 MG tablet Commonly known as: LOPRESSOR        TAKE these medications    Accu-Chek Guide Test test strip Generic drug: glucose blood Use as instructed    allopurinol  100 MG tablet Commonly known as: ZYLOPRIM  Take 1 tablet (100 mg total) by mouth daily.   atorvastatin  80 MG tablet Commonly known as: LIPITOR Take 1 tablet (80 mg total) by mouth daily.   cefUROXime  250 MG tablet Commonly known as: CEFTIN  Take 1 tablet (250 mg total) by mouth 2 (two) times daily with a meal for 2 days.   desonide  0.05 % cream Commonly known as: DESOWEN  Apply topically 2 (two) times daily. For dry and peeling skin. Use for 2 weeks.   diclofenac  Sodium 1 % Gel Commonly known as: VOLTAREN  Apply 2 g topically 4 (four) times daily. What changed:  when to take this reasons to take this   Dulera  100-5 MCG/ACT Aero Generic drug: mometasone -formoterol  Inhale 2 puffs into the lungs 2 (two) times daily.   Eliquis  5 MG Tabs tablet Generic drug: apixaban  Take 1 tablet (5 mg total) by mouth 2 (two) times daily.   gabapentin  100 MG capsule Commonly known as: NEURONTIN  Take 1 capsule (100 mg total) by mouth daily.   Jardiance  25 MG Tabs tablet Generic drug: empagliflozin  Take 1 tablet (25 mg total) by mouth daily before breakfast.   Lantus  SoloStar 100 UNIT/ML Solostar Pen Generic drug: insulin  glargine 15 Units daily What changed: additional instructions   metFORMIN  500 MG 24 hr tablet Commonly known as: GLUCOPHAGE -XR Take 1 tablet (500 mg total) by mouth in the morning  and at bedtime.   predniSONE  10 MG tablet Commonly known as: DELTASONE  Take 1-3 tablets (10-30 mg total) by mouth daily with breakfast. Take 30 mg for 2 days then 20 mg for 2 days then 10 mg for 2 days then stop Start taking on: February 19, 2024   QUEtiapine  100 MG tablet Commonly known as: SEROQUEL  Take 1 tablet (100 mg total) by mouth 2 (two) times daily.   Semaglutide  (2 MG/DOSE) 8 MG/3ML Sopn Inject 2 mg into the skin once a week.   sertraline  100 MG tablet Commonly known as: ZOLOFT  Take 1 tablet (100 mg total) by mouth daily.   TechLite Pen Needles 32G X 4 MM  Misc Generic drug: Insulin  Pen Needle Use to inject Lantus  daily   torsemide  10 MG tablet Commonly known as: DEMADEX  Take 2 tablets (20 mg total) by mouth daily. What changed: how much to take   Ventolin  HFA 108 (90 Base) MCG/ACT inhaler Generic drug: albuterol  Inhale 2 puffs into the lungs every 6 (six) hours as needed for wheezing or shortness of breath.               Durable Medical Equipment  (From admission, onward)           Start     Ordered   02/17/24 1326  For home use only DME Trach supplies  (For Home Use Only DME Trach Supplies)  Once       Question Answer Comment  Trach Type Shiley   Back Up Trach Type Shiley 4  Cuffed or Uncuffed Uncuffed   Fenestrated No   Size 4   XLT Length No   Trach Supplies/Equipment for Home Use Tube Holder/Collar   Trach Supplies/Equipment for Home Use Tracheostomy Drain Dressings   Trach Supplies/Equipment for Home Use Suction Catheters   Trach Supplies/Equipment for Home Use Tracheostomy Care Cleaning Kits   Trach Supplies/Equipment for Home Use Speaking Valve   Trach Supplies/Equipment for Home Use HME Device   Trach Supplies/Equipment for Home Use Humidification (oxygen 5 liters via trach collar to provide specified % - if tracheostomy is capped with occlusive cap during day, a separate [place] order will need to be provided)   Suction Catheter Size 10 French (for size 4)      02/17/24 1326           Allergies[1]  Follow-up Information     [facility] Follow up.   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 Contact information: [address] [place] [address] [phone]                  The results of significant diagnostics from this hospitalization (including imaging, microbiology, ancillary and laboratory) are listed below for reference.    Significant Diagnostic Studies: CT Angio Chest PE W and/or Wo Contrast Result Date: 02/15/2024 EXAM: CTA of the Chest without and with IV contrast for PE  02/15/2024 12:33:10 AM TECHNIQUE: CTA of the chest was performed after the administration of 75 mL of iohexol  (OMNIPAQUE ) 350 MG/ML injection. Multiplanar reformatted images are provided for review. MIP images are provided for review. Automated exposure control, iterative reconstruction, and/or weight based adjustment of the mA/kV was utilized to reduce the radiation dose to as low as reasonably achievable. COMPARISON: 01/07/2021, chest x-ray from the previous day. CLINICAL HISTORY: Cold symptoms for several days with shortness of breath. FINDINGS: PULMONARY ARTERIES: No filling defect is noted to suggest pulmonary embolism. The pulmonary artery shows a normal branching pattern bilaterally. Main  pulmonary artery is normal in caliber. MEDIASTINUM: The heart and pericardium demonstrate no acute abnormality. Tracheostomy tube is noted in satisfactory position. The right lobe of the thyroid  is prominent. The left lobe has been surgically removed. The aorta demonstrates mild atherosclerotic calcification. No aneurysmal dilatation or dissection is noted. LYMPH NODES: No mediastinal, hilar or axillary lymphadenopathy. LUNGS AND PLEURA: Lungs are well aerated bilaterally. Patchy infiltrate is noted in the right lower lobe without associated effusion. No parenchymal nodules are noted. No pneumothorax. UPPER ABDOMEN: The visualized upper abdomen is within normal limits. SOFT TISSUES AND BONES: Degenerative changes of the thoracic spine are noted. No rib abnormality is noted. No acute soft tissue abnormality. IMPRESSION: 1. No pulmonary embolism. 2. Patchy infiltrate in the right lower lobe without associated effusion. Electronically signed by: Oneil Devonshire MD 02/15/2024 12:44 AM EST RP Workstation: [id number]   DG Chest Portable 1 View Result Date: 02/14/2024 EXAM: 1 VIEW XRAY OF THE CHEST 02/14/2024 10:53:00 PM COMPARISON: Chest x-ray 09/07/2022, CT chest 01/07/2021. CLINICAL HISTORY: Shortness of breath, chest pain.  FINDINGS: LINES, TUBES AND DEVICES: Tracheostomy in place. LUNGS AND PLEURA: Question: Left basilar airspace opacity. No pleural effusion. No pneumothorax. HEART AND MEDIASTINUM: Following cardiac silhouette likely due to AP orbital technique. Otherwise unremarkable mediastinal silhouette. BONES AND SOFT TISSUES: No acute osseous abnormality. IMPRESSION: 1. Question left basilar airspace opacity. Recommend further evaluation with chest x-ray PA and lateral views. 2. Tracheostomy tube in place. Electronically signed by: Morgane Naveau MD 02/14/2024 10:57 PM EST RP Workstation: [id number]    Microbiology: Recent Results (from the past 240 hours)  Resp panel by RT-PCR (RSV, Flu A&B, Covid) Anterior Nasal Le     Status: Abnormal   Collection Time: 02/14/24 10:25 PM   Specimen: Anterior Nasal Le  Result Value Ref Range Status   SARS Coronavirus 2 by RT PCR NEGATIVE NEGATIVE Final   Influenza A by PCR NEGATIVE NEGATIVE Final   Influenza B by PCR NEGATIVE NEGATIVE Final    Comment: (NOTE) The Xpert Xpress SARS-CoV-2/FLU/RSV plus assay is intended as an aid in the diagnosis of influenza from Nasopharyngeal Le specimens and should not be used as a sole basis for treatment. Nasal washings and aspirates are unacceptable for Xpert Xpress SARS-CoV-2/FLU/RSV testing.  Fact Sheet for Patients: bloggercourse.com  Fact Sheet for Healthcare Providers: seriousbroker.it  This test is not yet approved or cleared by the [place]  FDA and has been authorized for detection and/or diagnosis of SARS-CoV-2 by FDA under an Emergency Use Authorization (EUA). This EUA will remain in effect (meaning this test can be used) for the duration of the COVID-19 declaration under Section 564(b)(1) of the Act, 21 U.S.C. section 360bbb-3(b)(1), unless the authorization is terminated or revoked.     Resp Syncytial Virus by PCR POSITIVE (A) NEGATIVE Final     Comment: (NOTE) Fact Sheet for Patients: bloggercourse.com  Fact Sheet for Healthcare Providers: seriousbroker.it  This test is not yet approved or cleared by the [place]  FDA and has been authorized for detection and/or diagnosis of SARS-CoV-2 by FDA under an Emergency Use Authorization (EUA). This EUA will remain in effect (meaning this test can be used) for the duration of the COVID-19 declaration under Section 564(b)(1) of the Act, 21 U.S.C. section 360bbb-3(b)(1), unless the authorization is terminated or revoked.  Performed at [facility], [address], [place], [place] [address]      Labs: Basic Metabolic Panel: Recent Labs  Lab 02/14/24 2226 02/14/24 2229 02/15/24 0349 02/16/24 0730  02/17/24 0403 02/18/24 0417  NA 137 139 138 141 141 141  K 3.9 3.9 4.0 4.1 3.6 3.6  CL 99  --  100 104 102 103  CO2 24  --  26 28 27 26   GLUCOSE 136*  --  114* 114* 134* 120*  BUN 17  --  17 25* 29* 32*  CREATININE 1.01  --  0.98 0.94 1.11 1.20  CALCIUM  8.9  --  8.9 8.8* 8.9 8.8*  MG  --   --  2.2 2.4  --   --    Liver Function Tests: Recent Labs  Lab 02/14/24 2226 02/15/24 0349  AST 36 31  ALT 29 29  ALKPHOS 136* 130*  BILITOT 1.2 0.9  PROT 7.8 7.4  ALBUMIN  3.8 3.8   No results for input(s): LIPASE, AMYLASE in the last 168 hours. No results for input(s): AMMONIA in the last 168 hours. CBC: Recent Labs  Lab 02/14/24 2226 02/14/24 2229 02/15/24 0349 02/16/24 0730 02/17/24 0403  WBC 6.5  --  5.8 5.6 7.4  NEUTROABS  --   --  3.0 3.1  --   HGB 14.9 15.6 14.3 14.1 14.8  HCT 47.3 46.0 44.8 43.6 46.4  MCV 88.7  --  89.2 86.7 87.7  PLT 184  --  165 183 199   Cardiac Enzymes: No results for input(s): CKTOTAL, CKMB, CKMBINDEX, TROPONINI in the last 168 hours. BNP: BNP (last 3 results) No results for input(s): BNP in the last 8760 hours.  ProBNP (last 3 results) Recent Labs     02/14/24 2226  PROBNP 168.0    CBG: Recent Labs  Lab 02/17/24 1115 02/17/24 1609 02/17/24 2107 02/18/24 0741 02/18/24 1152  GLUCAP 192* 140* 197* 104* 168*       Signed:  Sigurd Pac MD.  [facility] 02/18/2024, 1:04 PM       [1] No Known Allergies

## 2024-02-24 ENCOUNTER — Ambulatory Visit: Admitting: Cardiology

## 2024-03-02 ENCOUNTER — Telehealth: Payer: Self-pay | Admitting: Licensed Clinical Social Worker

## 2024-03-03 ENCOUNTER — Other Ambulatory Visit

## 2024-03-07 ENCOUNTER — Ambulatory Visit: Admitting: Cardiovascular Disease

## 2024-05-23 ENCOUNTER — Ambulatory Visit: Payer: Self-pay | Admitting: Nurse Practitioner
